# Patient Record
Sex: Female | Born: 1950
Health system: Southern US, Community
[De-identification: ages and names within clinical notes are randomized; demographics above are authoritative.]

## PROBLEM LIST (undated history)

## (undated) DIAGNOSIS — K635 Polyp of colon: Secondary | ICD-10-CM

## (undated) DIAGNOSIS — I2510 Atherosclerotic heart disease of native coronary artery without angina pectoris: Secondary | ICD-10-CM

## (undated) DIAGNOSIS — I10 Essential (primary) hypertension: Secondary | ICD-10-CM

## (undated) DIAGNOSIS — M25562 Pain in left knee: Secondary | ICD-10-CM

## (undated) DIAGNOSIS — C50411 Malignant neoplasm of upper-outer quadrant of right female breast: Secondary | ICD-10-CM

## (undated) DIAGNOSIS — I5032 Chronic diastolic (congestive) heart failure: Secondary | ICD-10-CM

## (undated) DIAGNOSIS — I517 Cardiomegaly: Secondary | ICD-10-CM

## (undated) DIAGNOSIS — T7840XA Allergy, unspecified, initial encounter: Secondary | ICD-10-CM

## (undated) DIAGNOSIS — K219 Gastro-esophageal reflux disease without esophagitis: Secondary | ICD-10-CM

## (undated) DIAGNOSIS — J449 Chronic obstructive pulmonary disease, unspecified: Secondary | ICD-10-CM

## (undated) DIAGNOSIS — M7660 Achilles tendinitis, unspecified leg: Secondary | ICD-10-CM

## (undated) DIAGNOSIS — G4700 Insomnia, unspecified: Secondary | ICD-10-CM

## (undated) DIAGNOSIS — E559 Vitamin D deficiency, unspecified: Secondary | ICD-10-CM

## (undated) DIAGNOSIS — Z87442 Personal history of urinary calculi: Secondary | ICD-10-CM

## (undated) DIAGNOSIS — I440 Atrioventricular block, first degree: Secondary | ICD-10-CM

## (undated) DIAGNOSIS — N809 Endometriosis, unspecified: Secondary | ICD-10-CM

## (undated) DIAGNOSIS — M779 Enthesopathy, unspecified: Secondary | ICD-10-CM

## (undated) DIAGNOSIS — M51369 Other intervertebral disc degeneration, lumbar region without mention of lumbar back pain or lower extremity pain: Secondary | ICD-10-CM

## (undated) DIAGNOSIS — M8580 Other specified disorders of bone density and structure, unspecified site: Secondary | ICD-10-CM

## (undated) DIAGNOSIS — I340 Nonrheumatic mitral (valve) insufficiency: Secondary | ICD-10-CM

## (undated) DIAGNOSIS — M1712 Unilateral primary osteoarthritis, left knee: Secondary | ICD-10-CM

## (undated) DIAGNOSIS — G2581 Restless legs syndrome: Secondary | ICD-10-CM

## (undated) DIAGNOSIS — Z95828 Presence of other vascular implants and grafts: Secondary | ICD-10-CM

## (undated) DIAGNOSIS — S83249A Other tear of medial meniscus, current injury, unspecified knee, initial encounter: Secondary | ICD-10-CM

## (undated) DIAGNOSIS — J189 Pneumonia, unspecified organism: Secondary | ICD-10-CM

## (undated) DIAGNOSIS — T8859XA Other complications of anesthesia, initial encounter: Secondary | ICD-10-CM

## (undated) DIAGNOSIS — I071 Rheumatic tricuspid insufficiency: Secondary | ICD-10-CM

## (undated) DIAGNOSIS — N6019 Diffuse cystic mastopathy of unspecified breast: Secondary | ICD-10-CM

## (undated) DIAGNOSIS — C50919 Malignant neoplasm of unspecified site of unspecified female breast: Secondary | ICD-10-CM

## (undated) DIAGNOSIS — Z87891 Personal history of nicotine dependence: Secondary | ICD-10-CM

## (undated) DIAGNOSIS — N200 Calculus of kidney: Secondary | ICD-10-CM

## (undated) DIAGNOSIS — I700 Atherosclerosis of aorta: Secondary | ICD-10-CM

## (undated) DIAGNOSIS — E039 Hypothyroidism, unspecified: Secondary | ICD-10-CM

## (undated) DIAGNOSIS — N182 Chronic kidney disease, stage 2 (mild): Secondary | ICD-10-CM

## (undated) DIAGNOSIS — D649 Anemia, unspecified: Secondary | ICD-10-CM

## (undated) DIAGNOSIS — I499 Cardiac arrhythmia, unspecified: Secondary | ICD-10-CM

## (undated) DIAGNOSIS — F32A Depression, unspecified: Secondary | ICD-10-CM

## (undated) DIAGNOSIS — Z9289 Personal history of other medical treatment: Secondary | ICD-10-CM

## (undated) DIAGNOSIS — E785 Hyperlipidemia, unspecified: Secondary | ICD-10-CM

## (undated) DIAGNOSIS — K279 Peptic ulcer, site unspecified, unspecified as acute or chronic, without hemorrhage or perforation: Secondary | ICD-10-CM

## (undated) DIAGNOSIS — M545 Low back pain, unspecified: Secondary | ICD-10-CM

## (undated) DIAGNOSIS — D179 Benign lipomatous neoplasm, unspecified: Secondary | ICD-10-CM

## (undated) DIAGNOSIS — I509 Heart failure, unspecified: Secondary | ICD-10-CM

## (undated) DIAGNOSIS — G8929 Other chronic pain: Secondary | ICD-10-CM

## (undated) DIAGNOSIS — G4730 Sleep apnea, unspecified: Secondary | ICD-10-CM

## (undated) DIAGNOSIS — I4891 Unspecified atrial fibrillation: Secondary | ICD-10-CM

## (undated) DIAGNOSIS — F419 Anxiety disorder, unspecified: Secondary | ICD-10-CM

## (undated) DIAGNOSIS — M1990 Unspecified osteoarthritis, unspecified site: Secondary | ICD-10-CM

## (undated) DIAGNOSIS — G43909 Migraine, unspecified, not intractable, without status migrainosus: Secondary | ICD-10-CM

## (undated) DIAGNOSIS — I4892 Unspecified atrial flutter: Secondary | ICD-10-CM

## (undated) DIAGNOSIS — G4733 Obstructive sleep apnea (adult) (pediatric): Secondary | ICD-10-CM

## (undated) DIAGNOSIS — Z7901 Long term (current) use of anticoagulants: Secondary | ICD-10-CM

## (undated) DIAGNOSIS — E079 Disorder of thyroid, unspecified: Secondary | ICD-10-CM

## (undated) DIAGNOSIS — F329 Major depressive disorder, single episode, unspecified: Secondary | ICD-10-CM

## (undated) HISTORY — DX: Insomnia, unspecified: G47.00

## (undated) HISTORY — DX: Hyperlipidemia, unspecified: E78.5

## (undated) HISTORY — PX: ESOPHAGOGASTRODUODENOSCOPY: SHX1529

## (undated) HISTORY — DX: Gastro-esophageal reflux disease without esophagitis: K21.9

## (undated) HISTORY — DX: Allergy, unspecified, initial encounter: T78.40XA

## (undated) HISTORY — PX: ACHILLES TENDON SURGERY: SHX542

## (undated) HISTORY — DX: Benign lipomatous neoplasm, unspecified: D17.9

## (undated) HISTORY — DX: Other chronic pain: G89.29

## (undated) HISTORY — DX: Migraine, unspecified, not intractable, without status migrainosus: G43.909

## (undated) HISTORY — PX: BACK SURGERY: SHX140

## (undated) HISTORY — PX: JOINT REPLACEMENT: SHX530

## (undated) HISTORY — PX: BREAST BIOPSY: SHX20

## (undated) HISTORY — DX: Other specified disorders of bone density and structure, unspecified site: M85.80

## (undated) HISTORY — PX: THYROIDECTOMY: SHX17

## (undated) HISTORY — DX: Other tear of medial meniscus, current injury, unspecified knee, initial encounter: S83.249A

## (undated) HISTORY — DX: Anxiety disorder, unspecified: F41.9

## (undated) HISTORY — DX: Vitamin D deficiency, unspecified: E55.9

## (undated) HISTORY — DX: Low back pain, unspecified: M54.50

## (undated) HISTORY — DX: Calculus of kidney: N20.0

## (undated) HISTORY — PX: COLONOSCOPY: SHX174

## (undated) HISTORY — PX: LUMBAR FUSION: SHX111

## (undated) HISTORY — DX: Unspecified osteoarthritis, unspecified site: M19.90

## (undated) HISTORY — DX: Major depressive disorder, single episode, unspecified: F32.9

## (undated) HISTORY — PX: ABDOMINAL HYSTERECTOMY: SHX81

## (undated) HISTORY — DX: Diffuse cystic mastopathy of unspecified breast: N60.19

## (undated) HISTORY — DX: Personal history of other medical treatment: Z92.89

## (undated) HISTORY — PX: FOOT SURGERY: SHX648

## (undated) HISTORY — DX: Low back pain: M54.5

## (undated) HISTORY — DX: Peptic ulcer, site unspecified, unspecified as acute or chronic, without hemorrhage or perforation: K27.9

## (undated) HISTORY — DX: Achilles tendinitis, unspecified leg: M76.60

## (undated) HISTORY — DX: Chronic obstructive pulmonary disease, unspecified: J44.9

## (undated) HISTORY — DX: Essential (primary) hypertension: I10

## (undated) HISTORY — DX: Disorder of thyroid, unspecified: E07.9

## (undated) HISTORY — DX: Depression, unspecified: F32.A

## (undated) HISTORY — DX: Nonrheumatic mitral (valve) insufficiency: I34.0

## (undated) HISTORY — DX: Enthesopathy, unspecified: M77.9

## (undated) SURGERY — CARDIOVERSION (CATH LAB)
Anesthesia: General

---

## 1988-07-11 HISTORY — PX: THYROIDECTOMY: SHX17

## 2012-10-10 LAB — HM COLONOSCOPY

## 2013-04-11 ENCOUNTER — Ambulatory Visit (INDEPENDENT_AMBULATORY_CARE_PROVIDER_SITE_OTHER): Payer: BC Managed Care – PPO | Admitting: General Surgery

## 2013-04-11 ENCOUNTER — Encounter (INDEPENDENT_AMBULATORY_CARE_PROVIDER_SITE_OTHER): Payer: Self-pay | Admitting: General Surgery

## 2013-04-11 VITALS — BP 142/70 | HR 60 | Resp 16 | Ht 65.0 in | Wt 286.2 lb

## 2013-04-11 NOTE — Patient Instructions (Signed)
We will start our evaluation and workup Please get your mammogram and have the results forwarded to my office Please call with any questions

## 2013-04-12 ENCOUNTER — Encounter (INDEPENDENT_AMBULATORY_CARE_PROVIDER_SITE_OTHER): Payer: Self-pay | Admitting: General Surgery

## 2013-04-12 ENCOUNTER — Telehealth (INDEPENDENT_AMBULATORY_CARE_PROVIDER_SITE_OTHER): Payer: Self-pay | Admitting: General Surgery

## 2013-04-12 NOTE — Telephone Encounter (Signed)
LMOM @8 :56 for patient to call Lawson Fiscal back re: labs and med release to get Abd U/S and colonoscopy from [facility] per EW..please overhead Lawson Fiscal if patient calls back

## 2013-04-12 NOTE — Progress Notes (Signed)
Patient ID: Amber Barnes, female   DOB: 08/19/1950, 62 y.o.   MRN: [id number]  Chief Complaint  Patient presents with  . Bariatric Pre-op    Initial consult gastric sleeve  . Weight Loss Surgery    HPI Amber Barnes is a 62 y.o. female.   HPI 62 year old Caucasian female referred by Dr. Konrad Felix for evaluation of weight loss surgery. The patient states that she has struggled with her weight since she was in her 25s. Despite numerous attempts were sustained weight loss she has been unsuccessful. She states that she is very healthy because her husband is a diabetic. Nonetheless she continues to gain weight. She has tried the [employer], Weight Watchers, Slim fast, a private nutritionist all without any long-term success. She has acquaintances that have had all 3 types of bariatric procedures. After discussing the different procedures with her friends as well as attending a seminar she believes the sleeve gastrectomy would best suit her.  Past Medical History  Diagnosis Date  . Hypertension   . Hyperlipidemia   . Arthritis     Past Surgical History  Procedure Laterality Date  . Abdominal hysterectomy    . Thyroidectomy      thyroid lobectomy secondary to a benign nodule  . Achilles tendon surgery      Family History  Problem Relation Age of Onset  . Cancer Mother     leukemia  . Cancer Father     colon  . Heart disease Father   . Hypertension Father   . Cancer Sister     ovarian    Social History History  Substance Use Topics  . Smoking status: Former Games developer  . Smokeless tobacco: Never Used  . Alcohol Use: Yes    Not on File  Current Outpatient Prescriptions  Medication Sig Dispense Refill  . aspirin 81 MG tablet Take 81 mg by mouth daily.      Marland Kitchen atorvastatin (LIPITOR) 40 MG tablet Take 40 mg by mouth daily.      . bisoprolol (ZEBETA) 10 MG tablet Take 10 mg by mouth daily.      Marland Kitchen CALCIUM PO Take by mouth.      . Cholecalciferol (VITAMIN D PO) Take by mouth.       . diclofenac (VOLTAREN) 75 MG EC tablet Take 75 mg by mouth 2 (two) times daily.      Marland Kitchen losartan-hydrochlorothiazide (HYZAAR) 100-25 MG per tablet Take 1 tablet by mouth daily.      . Multiple Vitamin (MULTI VITAMIN DAILY PO) Take by mouth.      . sertraline (ZOLOFT) 100 MG tablet Take 100 mg by mouth daily.       No current facility-administered medications for this visit.    Review of Systems Review of Systems  Constitutional: Negative for fever, chills and unexpected weight change.  HENT: Negative for hearing loss, nosebleeds, congestion, sore throat, trouble swallowing, neck pain and voice change.        Partial denture  Eyes: Negative for visual disturbance.  Respiratory: Negative for cough and wheezing.   Cardiovascular: Negative for chest pain, palpitations and leg swelling.       She denies any short of breath, dyspnea on exertion, orthopnea, paroxysmal nocturnal dyspnea, or chest pain.  Gastrointestinal: Negative for nausea, vomiting, abdominal pain, diarrhea, constipation, blood in stool and abdominal distention.       She denies any reflux  Genitourinary: Negative for hematuria, vaginal bleeding and difficulty urinating.  Musculoskeletal: Negative for arthralgias.  She has bilateral knee pain left greater than right. She has a left meniscus tear. Orthopedic surgeon states that she needs to loose weight prior to intervention. Osteoarthritis left knee. She had right Achilles tendon surgery 2 years ago. Because of left knee pain she hasn't been able to do as much exercising as she likes to do  Skin: Negative for rash and wound.  Neurological: Negative for seizures, syncope and headaches.       She denies any TIAs or amaurosis fugax  Hematological: Negative for adenopathy. Does not bruise/bleed easily.  Psychiatric/Behavioral: Negative for confusion.    Blood pressure 142/70, pulse 60, resp. rate 16, height 5\' 5"  (1.651 m), weight 286 lb 3.2 oz (129.819 kg).  Physical  Exam Physical Exam  Vitals reviewed. Constitutional: She is oriented to person, place, and time. She appears well-developed and well-nourished. No distress.  Morbidly obese  HENT:  Head: Normocephalic and atraumatic.  Right Ear: External ear normal.  Left Ear: External ear normal.  Eyes: Conjunctivae are normal. No scleral icterus.  Neck: Normal range of motion. Neck supple. No tracheal deviation present. No thyromegaly present.    Cardiovascular: Normal rate and normal heart sounds.   Pulmonary/Chest: Effort normal and breath sounds normal. No stridor. No respiratory distress. She has no wheezes.  Abdominal: Soft. She exhibits no distension. There is no tenderness. There is no rebound and no guarding.    Musculoskeletal: She exhibits no edema and no tenderness.  Lymphadenopathy:    She has no cervical adenopathy.  Neurological: She is alert and oriented to person, place, and time. She exhibits normal muscle tone.  Skin: Skin is warm and dry. No rash noted. She is not diaphoretic. No erythema.  Psychiatric: She has a normal mood and affect. Her behavior is normal. Judgment and thought content normal.    Data Reviewed Epworth sleepiness scale score 1  Assessment    Morbid obesity 47.6 Hypertension Hyperlipidemia     Plan    The patient meets weight loss surgery criteria. I think the patient would be an acceptable candidate for Laparoscopic vertical sleeve gastrectomy.   We discussed laparoscopic sleeve gastrectomy. We discussed the preoperative, operative and postoperative process. Using diagrams, I explained the surgery in detail including the performance of an EGD near the end of the surgery and an Upper GI swallow study on POD 1. We discussed the typical hospital course including a 2-3 day stay baring any complications.   The patient was given educational material. I quoted the patient that most patients can lose up to 50-70% of their excess weight. We did discuss the  possibility of weight regain several years after the procedure.  The risks of infection, bleeding, pain, scarring, weight regain, too little or too much weight loss, vitamin deficiencies and need for lifelong vitamin supplementation, hair loss, need for protein supplementation, leaks, stricture, reflux, food intolerance, gallstone formation, hernia, need for reoperation and conversion to roux Y gastric bypass, need for open surgery, injury to spleen or surrounding structures, DVT's, PE, and death again discussed with the patient and the patient expressed understanding and desires to proceed with laparoscopic vertical sleeve gastrectomy, possible open, intraoperative endoscopy.  We discussed that before and after surgery that there would be an alteration in their diet. I explained that we have put them on a diet 2 weeks before surgery. I also explained that they would be on a liquid diet for 2 weeks after surgery. We discussed that they would have to avoid certain foods after  surgery. We discussed the importance of physical activity as well as compliance with our dietary and supplement recommendations and routine follow-up.  I explained to the patient that we will start our evaluation process which includes labs, Upper GI to evaluate stomach and swallowing anatomy, nutritionist consultation, psychiatrist consultation, EKG, CXR. We will get her records of her abdominal ultrasound and colonoscopy from [facility]  Mary Sella. Andrey Campanile, MD, FACS General, Bariatric, & Minimally Invasive Surgery [facility], [place]          [facility] M 04/12/2013, 10:44 AM

## 2013-04-15 ENCOUNTER — Telehealth (INDEPENDENT_AMBULATORY_CARE_PROVIDER_SITE_OTHER): Payer: Self-pay | Admitting: General Surgery

## 2013-04-15 NOTE — Telephone Encounter (Signed)
Faxed signed med release asking for abd U/S and colonoscopy report [phone] @11 :16 ..waiting on the reports to be faxed back per EW req...signed medical release and confirmation to med recds to be scanned in chart

## 2013-04-24 ENCOUNTER — Encounter: Payer: Self-pay | Admitting: Dietician

## 2013-04-24 ENCOUNTER — Encounter: Payer: BC Managed Care – PPO | Attending: General Surgery | Admitting: Dietician

## 2013-04-24 VITALS — Ht 66.0 in | Wt 288.6 lb

## 2013-04-24 DIAGNOSIS — Z713 Dietary counseling and surveillance: Secondary | ICD-10-CM | POA: Insufficient documentation

## 2013-04-24 DIAGNOSIS — E669 Obesity, unspecified: Secondary | ICD-10-CM | POA: Insufficient documentation

## 2013-04-24 NOTE — Progress Notes (Signed)
  Pre-Op Assessment Visit:  Pre-Operative Sleeve Gastrectomy Surgery  Medical Nutrition Therapy:  Appt start time: 1130   End time:  1230.  Patient was seen on 04/24/13 for Pre-Operative Sleeve Gastrectomy Nutrition Assessment. Assessment and letter of approval faxed to [facility] Bariatric Surgery Program coordinator on 04/24/13.   Handouts given during visit include:  Pre-Op Goals Bariatric Surgery Protein Shakes  Patient to call the [facility] to enroll in Pre-Op and Post-Op Nutrition Education when surgery date is scheduled.

## 2013-04-24 NOTE — Patient Instructions (Signed)
Patient to call the [facility] to enroll in Pre-Op and Post-Op Nutrition Education when surgery date is scheduled. 

## 2013-05-06 ENCOUNTER — Encounter (INDEPENDENT_AMBULATORY_CARE_PROVIDER_SITE_OTHER): Payer: Self-pay

## 2013-05-07 ENCOUNTER — Other Ambulatory Visit: Payer: Self-pay

## 2013-05-07 ENCOUNTER — Ambulatory Visit (HOSPITAL_COMMUNITY)
Admission: RE | Admit: 2013-05-07 | Discharge: 2013-05-07 | Disposition: A | Discharge status: Home / Self Care | Payer: BC Managed Care – PPO | Source: Ambulatory Visit | Attending: General Surgery | Admitting: General Surgery

## 2013-05-07 DIAGNOSIS — M129 Arthropathy, unspecified: Secondary | ICD-10-CM | POA: Insufficient documentation

## 2013-05-07 DIAGNOSIS — K219 Gastro-esophageal reflux disease without esophagitis: Secondary | ICD-10-CM | POA: Insufficient documentation

## 2013-05-07 DIAGNOSIS — E785 Hyperlipidemia, unspecified: Secondary | ICD-10-CM | POA: Insufficient documentation

## 2013-05-07 DIAGNOSIS — K449 Diaphragmatic hernia without obstruction or gangrene: Secondary | ICD-10-CM | POA: Insufficient documentation

## 2013-05-07 DIAGNOSIS — I10 Essential (primary) hypertension: Secondary | ICD-10-CM | POA: Insufficient documentation

## 2013-05-07 DIAGNOSIS — I517 Cardiomegaly: Secondary | ICD-10-CM | POA: Insufficient documentation

## 2013-05-07 DIAGNOSIS — Z6841 Body Mass Index (BMI) 40.0 and over, adult: Secondary | ICD-10-CM | POA: Insufficient documentation

## 2013-05-14 LAB — LIPID PANEL
Cholesterol: 175 mg/dL (ref 0–200)
HDL: 52 mg/dL (ref >39)
LDL Cholesterol: 101 mg/dL — ABNORMAL HIGH (ref 0–99)
Total CHOL/HDL Ratio: 3.4 Ratio
Triglycerides: 108 mg/dL (ref <150)
VLDL: 22 mg/dL (ref 0–40)

## 2013-05-14 LAB — CBC WITH DIFFERENTIAL/PLATELET
Basophils Absolute: 0 10*3/uL (ref 0.0–0.1)
Basophils Relative: 0 % (ref 0–1)
Eosinophils Absolute: 0.1 10*3/uL (ref 0.0–0.7)
Eosinophils Relative: 1 % (ref 0–5)
HCT: 37.2 % (ref 36.0–46.0)
Hemoglobin: 12.9 g/dL (ref 12.0–15.0)
Lymphocytes Relative: 27 % (ref 12–46)
Lymphs Abs: 1.8 10*3/uL (ref 0.7–4.0)
MCH: 30.2 pg (ref 26.0–34.0)
MCHC: 34.7 g/dL (ref 30.0–36.0)
MCV: 87.1 fL (ref 78.0–100.0)
Monocytes Absolute: 0.5 10*3/uL (ref 0.1–1.0)
Monocytes Relative: 7 % (ref 3–12)
Neutro Abs: 4.3 10*3/uL (ref 1.7–7.7)
Neutrophils Relative %: 65 % (ref 43–77)
Platelets: 198 10*3/uL (ref 150–400)
RBC: 4.27 MIL/uL (ref 3.87–5.11)
RDW: 14.2 % (ref 11.5–15.5)
WBC: 6.6 10*3/uL (ref 4.0–10.5)

## 2013-05-14 LAB — COMPREHENSIVE METABOLIC PANEL
ALT: 22 U/L (ref 0–35)
AST: 18 U/L (ref 0–37)
Albumin: 3.9 g/dL (ref 3.5–5.2)
Alkaline Phosphatase: 73 U/L (ref 39–117)
BUN: 13 mg/dL (ref 6–23)
CO2: 27 mEq/L (ref 19–32)
Calcium: 9 mg/dL (ref 8.4–10.5)
Chloride: 105 mEq/L (ref 96–112)
Creat: 0.65 mg/dL (ref 0.50–1.10)
Glucose, Bld: 86 mg/dL (ref 70–99)
Potassium: 4.3 mEq/L (ref 3.5–5.3)
Sodium: 138 mEq/L (ref 135–145)
Total Bilirubin: 0.4 mg/dL (ref 0.3–1.2)
Total Protein: 6.4 g/dL (ref 6.0–8.3)

## 2013-05-14 LAB — T4: T4, Total: 6.3 ug/dL (ref 5.0–12.5)

## 2013-05-14 LAB — TSH: TSH: 2.519 u[IU]/mL (ref 0.350–4.500)

## 2013-05-15 LAB — H. PYLORI ANTIBODY, IGG: H Pylori IgG: 0.43 {ISR}

## 2014-09-29 ENCOUNTER — Ambulatory Visit: Payer: Self-pay

## 2014-10-10 ENCOUNTER — Emergency Department
Admit: 2014-10-10 | Disposition: A | Discharge status: Home / Self Care | Payer: Self-pay | Admitting: Emergency Medicine

## 2014-10-10 LAB — URINALYSIS, COMPLETE
Bacteria: NONE SEEN
Bilirubin,UR: NEGATIVE
Glucose,UR: NEGATIVE mg/dL (ref 0–75)
Ketone: NEGATIVE
Leukocyte Esterase: NEGATIVE
Nitrite: NEGATIVE
Ph: 6 (ref 4.5–8.0)
Protein: NEGATIVE
RBC,UR: NONE SEEN /HPF (ref 0–5)
Specific Gravity: 1.006 (ref 1.003–1.030)
Squamous Epithelial: NONE SEEN
WBC UR: 1 /HPF (ref 0–5)

## 2014-10-21 ENCOUNTER — Ambulatory Visit: Payer: Self-pay | Admitting: Cardiovascular Disease

## 2014-11-11 ENCOUNTER — Encounter (INDEPENDENT_AMBULATORY_CARE_PROVIDER_SITE_OTHER): Payer: Self-pay

## 2014-11-11 ENCOUNTER — Other Ambulatory Visit: Payer: Self-pay | Admitting: Orthopedic Surgery

## 2014-11-11 ENCOUNTER — Ambulatory Visit (INDEPENDENT_AMBULATORY_CARE_PROVIDER_SITE_OTHER): Payer: PRIVATE HEALTH INSURANCE | Admitting: Cardiovascular Disease

## 2014-11-11 ENCOUNTER — Encounter: Payer: Self-pay | Admitting: Cardiovascular Disease

## 2014-11-11 VITALS — BP 112/80 | HR 56 | Ht 65.0 in | Wt 292.8 lb

## 2014-11-11 DIAGNOSIS — E785 Hyperlipidemia, unspecified: Secondary | ICD-10-CM

## 2014-11-11 DIAGNOSIS — E669 Obesity, unspecified: Secondary | ICD-10-CM | POA: Insufficient documentation

## 2014-11-11 DIAGNOSIS — T50901A Poisoning by unspecified drugs, medicaments and biological substances, accidental (unintentional), initial encounter: Secondary | ICD-10-CM

## 2014-11-11 DIAGNOSIS — R634 Abnormal weight loss: Secondary | ICD-10-CM | POA: Insufficient documentation

## 2014-11-11 DIAGNOSIS — F419 Anxiety disorder, unspecified: Secondary | ICD-10-CM | POA: Insufficient documentation

## 2014-11-11 DIAGNOSIS — F418 Other specified anxiety disorders: Secondary | ICD-10-CM

## 2014-11-11 DIAGNOSIS — E782 Mixed hyperlipidemia: Secondary | ICD-10-CM | POA: Insufficient documentation

## 2014-11-11 DIAGNOSIS — M1990 Unspecified osteoarthritis, unspecified site: Secondary | ICD-10-CM | POA: Insufficient documentation

## 2014-11-11 DIAGNOSIS — I10 Essential (primary) hypertension: Secondary | ICD-10-CM | POA: Insufficient documentation

## 2014-11-11 DIAGNOSIS — T50905A Adverse effect of unspecified drugs, medicaments and biological substances, initial encounter: Secondary | ICD-10-CM

## 2014-11-11 DIAGNOSIS — F329 Major depressive disorder, single episode, unspecified: Secondary | ICD-10-CM

## 2014-11-11 DIAGNOSIS — F32A Depression, unspecified: Secondary | ICD-10-CM | POA: Insufficient documentation

## 2014-11-11 DIAGNOSIS — R0602 Shortness of breath: Secondary | ICD-10-CM

## 2014-11-11 DIAGNOSIS — M170 Bilateral primary osteoarthritis of knee: Secondary | ICD-10-CM

## 2014-11-11 DIAGNOSIS — M5126 Other intervertebral disc displacement, lumbar region: Secondary | ICD-10-CM

## 2014-11-11 NOTE — Patient Instructions (Signed)
You are doing well. No medication changes were made.  Please call us if you have new issues that need to be addressed before your next appt.  Your physician wants you to follow-up in: 12 months.  You will receive a reminder letter in the mail two months in advance. If you don't receive a letter, please call our office to schedule the follow-up appointment. 

## 2014-11-11 NOTE — Assessment & Plan Note (Signed)
She reports 13 pound weight loss on the new medication in the past month. Also has changed her eating habits, less snacking

## 2014-11-11 NOTE — Assessment & Plan Note (Signed)
Recommended she stay on her Crestor. She will have routine lab work with primary care end of May 2016

## 2014-11-11 NOTE — Assessment & Plan Note (Signed)
She reports stable symptoms on her current medications

## 2014-11-11 NOTE — Progress Notes (Signed)
Patient ID: Amber Barnes, female   DOB: Jul 28, 1950, 64 y.o.   MRN: [id number] [name] is a pleasant 65 year old woman with history of obesity, ostial arthritis, hyperlipidemia, hypertension, who recently moved to the [place] area from [place] and presents to establish care in the [place] office.  She reports that she has been on Belviq for the past month for weight loss and needs clearance to continue on the medication. She is limited in her activity secondary to chronic back and knee pain.  She is seeing orthopedics for her back and was told not to start exercise regiment until they have a plan for rehabilitation. She reports having a 13 pound weight loss since March 2016 with the new medication. She denies having any new symptoms. Denies any shortness of breath, chest discomfort. Reports blood pressure has been well-controlled on her current medications.  She denies any prior cardiac history. Prior workup 8-10 years ago at [facility] with [facility], told it was normal  She is having sciatica pain for the past 2 months with discomfort radiating down her right leg.  In terms of her past medical history, She did not tolerate Lipitor and cholesterol climbed up to 285, LDL 196 On Lipitor total cholesterol 168, LDL 88 in October 2015 No recent lipid panel available on Crestor She is scheduled to have repeat lab work at the end of May 2016 with primary care  She reports having a prior smoking history for 5 years, then smoked only small amounts  EKG on today's visit shows normal sinus rhythm with rate 56 bpm, nonspecific T wave abnormality through the anterior precordial leads, leads 3 and aVF  No Known Allergies  Current Outpatient Prescriptions on File Prior to Visit  Medication Sig Dispense Refill  . aspirin 81 MG tablet Take 81 mg by mouth daily.    . B Complex Vitamins (VITAMIN B COMPLEX PO) Take by mouth daily.    . bisoprolol (ZEBETA) 10 MG tablet Take 10 mg by mouth  daily.    . calcium gluconate 500 MG tablet Take 1 tablet by mouth daily.     . Lorcaserin HCl 10 MG TABS Take 10 mg by mouth 2 (two) times daily.    Marland Kitchen losartan-hydrochlorothiazide (HYZAAR) 100-25 MG per tablet Take 1 tablet by mouth daily.    . Multiple Vitamin (MULTIVITAMIN) tablet Take 1 tablet by mouth daily.    . rosuvastatin (CRESTOR) 20 MG tablet Take 20 mg by mouth daily.    . sertraline (ZOLOFT) 100 MG tablet Take 100 mg by mouth daily.     No current facility-administered medications on file prior to visit.    Past Medical History  Diagnosis Date  . Achilles tendinitis   . Acute medial meniscal tear   . Osteopenia   . Osteoarthritis     left knee  . Vitamin D deficiency   . Tendonitis   . Hypertension   . Hyperlipidemia   . Anxiety   . Chronic lower back pain   . Fibrocystic breast disease   . Insomnia   . Lipoma     Past Surgical History  Procedure Laterality Date  . Colonoscopy    . Esophagogastroduodenoscopy    . Abdominal hysterectomy    . Total thyroidectomy      Social History  reports that she has quit smoking. Her smoking use included Cigarettes. She has a 2.5 pack-year smoking history. She does not have any smokeless tobacco history on file. She reports that she drinks alcohol.  She reports that she does not use illicit drugs.  Family History family history includes Heart attack (age of onset: 93) in her father; Heart disease in her father; Hyperlipidemia in her father; Hypertension in her father.   Review of Systems: General: negative for chills, fever, night sweats or weight changes.  Cardiovascular: negative for chest pain, edema, orthopnea, palpitations, paroxysmal nocturnal dyspnea, shortness of breath or dyspnea on exertion Dermatological: negative for rash Respiratory: negative for cough or wheezing Orthopedic: Severe back, knee, hip pain Abdominal: negative for nausea, vomiting, diarrhea, bright red blood per rectum, melena, or  hematemesis Neurologic: negative for visual changes, syncope, or dizziness All other systems reviewed and are otherwise negative except as noted above.  Labs: No recent labs available  Radiology/Studies:  No results found.  Weights: Filed Weights   11/11/14 1515  Weight: 132.791 kg (292 lb 12 oz)     Physical Exam: Blood pressure 112/80, pulse 56, height 5\' 5"  (1.651 m), weight 132.791 kg (292 lb 12 oz). General: Well developed, well nourished, in no acute distress. Obese Head: Normocephalic, atraumatic, sclera non-icteric, no xanthomas, nares are without discharge.  Neck: Negative for carotid bruits. JVD not elevated. Lungs: Clear bilaterally to auscultation without wheezes, rales, or rhonchi. Breathing is unlabored. Heart: RRR with S1 S2. No murmurs, rubs, or gallops appreciated. Abdomen: Soft, non-tender, non-distended with normoactive bowel sounds. No hepatomegaly. No rebound/guarding. No obvious abdominal masses. Msk:  Strength and tone appear normal for age. Extremities: No clubbing or cyanosis. No edema.  Distal pedal pulses are 2+ and equal bilaterally. Neuro: Alert and oriented X 3. No focal deficit. No facial asymmetry. Moves all extremities spontaneously. Psych:  Responds to questions appropriately with a normal affect.

## 2014-11-11 NOTE — Assessment & Plan Note (Signed)
Blood pressure is well controlled on today's visit. No changes made to the medications. 

## 2014-11-11 NOTE — Assessment & Plan Note (Signed)
Limited in her ability to exercise secondary to her back, severe knee pain.  Would consider water aerobics once cleared by orthopedics

## 2014-11-11 NOTE — Assessment & Plan Note (Signed)
Currently appears to be handling Belviq well with no complications. She denies any side effects. Blood pressure, and clinical exam essentially normal. Nonspecific T wave abnormality on EKG. Given her lack of symptoms, we can continue medical management, no further testing needed. She would be acceptable candidate to continue this medication given the benefits of weight loss.

## 2014-11-15 ENCOUNTER — Ambulatory Visit
Admission: RE | Admit: 2014-11-15 | Discharge: 2014-11-15 | Disposition: A | Discharge status: Home / Self Care | Payer: PRIVATE HEALTH INSURANCE | Source: Ambulatory Visit | Attending: Orthopedic Surgery | Admitting: Orthopedic Surgery

## 2014-11-15 DIAGNOSIS — M4806 Spinal stenosis, lumbar region: Secondary | ICD-10-CM | POA: Diagnosis not present

## 2014-11-15 DIAGNOSIS — M5126 Other intervertebral disc displacement, lumbar region: Secondary | ICD-10-CM | POA: Diagnosis present

## 2014-11-30 ENCOUNTER — Encounter: Payer: Self-pay | Admitting: Emergency Medicine

## 2014-11-30 ENCOUNTER — Emergency Department
Admission: EM | Admit: 2014-11-30 | Discharge: 2014-11-30 | Disposition: A | Discharge status: Home / Self Care | Payer: PRIVATE HEALTH INSURANCE | Attending: Emergency Medicine | Admitting: Emergency Medicine

## 2014-11-30 DIAGNOSIS — I10 Essential (primary) hypertension: Secondary | ICD-10-CM | POA: Diagnosis not present

## 2014-11-30 DIAGNOSIS — M5410 Radiculopathy, site unspecified: Secondary | ICD-10-CM

## 2014-11-30 DIAGNOSIS — M5416 Radiculopathy, lumbar region: Secondary | ICD-10-CM | POA: Diagnosis not present

## 2014-11-30 DIAGNOSIS — M545 Low back pain, unspecified: Secondary | ICD-10-CM

## 2014-11-30 DIAGNOSIS — Z79899 Other long term (current) drug therapy: Secondary | ICD-10-CM | POA: Diagnosis not present

## 2014-11-30 DIAGNOSIS — G8929 Other chronic pain: Secondary | ICD-10-CM

## 2014-11-30 DIAGNOSIS — Z87891 Personal history of nicotine dependence: Secondary | ICD-10-CM | POA: Insufficient documentation

## 2014-11-30 MED ORDER — HYDROMORPHONE HCL 1 MG/ML IJ SOLN
INTRAMUSCULAR | Status: AC
  Filled 2014-11-30: qty 1

## 2014-11-30 MED ORDER — PREDNISONE 10 MG PO TABS: ORAL_TABLET | ORAL | Status: DC

## 2014-11-30 MED ORDER — HYDROMORPHONE HCL 1 MG/ML IJ SOLN
1.0000 mg | Freq: Once | INTRAMUSCULAR | Status: AC
  Administered 2014-11-30: 1 mg via INTRAMUSCULAR

## 2014-11-30 NOTE — ED Provider Notes (Signed)
[facility] Emergency Department Provider Note  ____________________________________________  Time seen: Approximately 1552  I have reviewed the triage vital signs and the nursing notes.   HISTORY  Chief Complaint No chief complaint on file.  back pain  HPI Amber Barnes is a 64 y.o. female comes in today with complaints of low back pain radiating to the right and into the groin area. She states she's been experiencing this for at least 2-3 months but today is worse. Currently her orthopedist in [place] has her taking hydrocodone which she states is not helping today. No recent injury. No loss of bowel or bladder function. Patient states her pain currently is 10 out of 10. Her husband is in the room with her. She has an appointment with her orthopedist in [facility]  5/26. She had an MRI done in [place]   Past Medical History  Diagnosis Date  . Achilles tendinitis   . Acute medial meniscal tear   . Osteopenia   . Osteoarthritis     left knee  . Vitamin D deficiency   . Tendonitis   . Hypertension   . Hyperlipidemia   . Anxiety   . Chronic lower back pain   . Fibrocystic breast disease   . Insomnia   . Lipoma     Patient Active Problem List   Diagnosis Date Noted  . SOB (shortness of breath) 11/11/2014  . Morbid obesity 11/11/2014  . Hyperlipidemia 11/11/2014  . Essential hypertension 11/11/2014  . Anxiety and depression 11/11/2014  . Arthritis, senescent 11/11/2014  . Weight loss due to medication 11/11/2014    Past Surgical History  Procedure Laterality Date  . Colonoscopy    . Esophagogastroduodenoscopy    . Abdominal hysterectomy    . Total thyroidectomy    . Achilles tendon surgery      Current Outpatient Rx  Name  Route  Sig  Dispense  Refill  . aspirin 81 MG tablet   Oral   Take 81 mg by mouth daily.         . B Complex Vitamins (VITAMIN B COMPLEX PO)   Oral   Take by mouth daily.         . bisoprolol (ZEBETA) 10 MG  tablet   Oral   Take 10 mg by mouth daily.         . calcium gluconate 500 MG tablet   Oral   Take 1 tablet by mouth daily.          Marland Kitchen HYDROcodone-acetaminophen (NORCO) 10-325 MG per tablet   Oral   Take 1 tablet by mouth every 6 (six) hours as needed.         . Lorcaserin HCl 10 MG TABS   Oral   Take 10 mg by mouth 2 (two) times daily.         Marland Kitchen losartan-hydrochlorothiazide (HYZAAR) 100-25 MG per tablet   Oral   Take 1 tablet by mouth daily.         . Multiple Vitamin (MULTIVITAMIN) tablet   Oral   Take 1 tablet by mouth daily.         . predniSONE (DELTASONE) 10 MG tablet      Take 6 tablets  today, on day 2 take 5 tablets, day 3 take 4 tablets, day 4 take 3 tablets, day 5 take  2 tablets and 1 tablet the last day   21 tablet   0   . rosuvastatin (CRESTOR) 20 MG tablet  Oral   Take 20 mg by mouth daily.         . sertraline (ZOLOFT) 100 MG tablet   Oral   Take 100 mg by mouth daily.         Marland Kitchen VITAMIN D, CHOLECALCIFEROL, PO   Oral   Take 500 Units by mouth daily.           Allergies Review of patient's allergies indicates no known allergies.  Family History  Problem Relation Age of Onset  . Heart disease Father   . Heart attack Father 31  . Hyperlipidemia Father   . Hypertension Father     Social History History  Substance Use Topics  . Smoking status: Former Smoker -- 0.25 packs/day for 10 years    Types: Cigarettes  . Smokeless tobacco: Not on file  . Alcohol Use: No     Comment: social    Review of Systems Constitutional: No fever/chills Eyes: No visual changes. ENT: No sore throat. Cardiovascular: Denies chest pain. Respiratory: Denies shortness of breath. Gastrointestinal: No abdominal pain.  No nausea, no vomiting.  No diarrhea.   Genitourinary: Negative for dysuria and negative for kidney stones. Musculoskeletal: Positive for chronic back pain Skin: Negative for rash. Neurological: Negative for headaches there is  minimal numbness in the right leg.  10-point ROS otherwise negative.  ____________________________________________   PHYSICAL EXAM:  VITAL SIGNS: ED Triage Vitals  Enc Vitals Group     BP 11/30/14 1459 158/82 mmHg     Pulse Rate 11/30/14 1459 56     Resp 11/30/14 1459 20     Temp 11/30/14 1459 97.9 F (36.6 C)     Temp Source 11/30/14 1459 Oral     SpO2 11/30/14 1459 94 %     Weight 11/30/14 1459 292 lb (132.45 kg)     Height 11/30/14 1459 5\' 5"  (1.651 m)     Head Cir --      Peak Flow --      Pain Score 11/30/14 1502 10     Pain Loc --      Pain Edu? --      Excl. in [place]? --     Constitutional: Alert and oriented. Well appearing and in no acute distress. Patient is morbidly obese sitting in wheelchair. Eyes: Conjunctivae are normal. PERRL. EOMI. Head: Atraumatic. Nose: No congestion/rhinnorhea. Mouth/Throat: Mucous membranes are moist.  Oropharynx non-erythematous. Neck: No stridor.   Cardiovascular: Normal rate, regular rhythm. Grossly normal heart sounds.  Good peripheral circulation. Respiratory: Normal respiratory effort.  No retractions. Lungs CTAB. Gastrointestinal: Soft and nontender. No distention. No abdominal bruits. No CVA tenderness. Musculoskeletal: Moderate tenderness on palpation of lower back L5-S1 area and paravertebral muscles. Range of motion is restricted secondary to pain. Straight leg raises left leg 90 without discomfort right leg approximately 45 with discomfort. Patient is sitting in a wheelchair and states that it's extremely painful for her to stand or move. She states this pain is no different from what she is been experiencing for the last 2-3 months. Neurologic:  Normal speech and language. No gross focal neurologic deficits are appreciated. Speech is normal. No gait instability. Skin:  Skin is warm, dry and intact. No rash noted. Psychiatric: Mood and affect are normal. Speech and behavior are  normal.  ____________________________________________   LABS (all labs ordered are listed, but only abnormal results are displayed)  Labs Reviewed - No data to display ____________________________________________  EKG  Deferred ____________________________________________  RADIOLOGY  Deferred ____________________________________________  PROCEDURES  Procedure(s) performed: None  Critical Care performed: No  ____________________________________________   INITIAL IMPRESSION / ASSESSMENT AND PLAN / ED COURSE  Pertinent labs & imaging results that were available during my care of the patient were reviewed by me and considered in my medical decision making (see chart for details).  MRI results was discussed with patient and husband. She is to follow-up with her orthopedist and around on her scheduled appointment. Discussed with her 1 time dose of pain medication the emergency room and to start on prednisone taper. She is return to the emergency room if any severe worsening or loss of bowel or bladder control ____________________________________________   FINAL CLINICAL IMPRESSION(S) / ED DIAGNOSES  Final diagnoses:  Chronic low back pain  Radiculopathy with lower extremity symptoms      Johnn Hai, PA-C 11/30/14 1800  Carrie Mew, MD 12/02/14 1207

## 2014-11-30 NOTE — ED Notes (Signed)
Pt reports chronic back pain lower back pain that radiates to groin. Pt reports severe pain today.

## 2014-12-04 ENCOUNTER — Other Ambulatory Visit: Payer: Self-pay | Admitting: Orthopedic Surgery

## 2014-12-04 DIAGNOSIS — M25551 Pain in right hip: Secondary | ICD-10-CM

## 2014-12-18 ENCOUNTER — Ambulatory Visit
Admission: RE | Admit: 2014-12-18 | Discharge: 2014-12-18 | Disposition: A | Discharge status: Home / Self Care | Payer: PRIVATE HEALTH INSURANCE | Source: Ambulatory Visit | Attending: Orthopedic Surgery | Admitting: Orthopedic Surgery

## 2014-12-18 DIAGNOSIS — M1611 Unilateral primary osteoarthritis, right hip: Secondary | ICD-10-CM | POA: Insufficient documentation

## 2014-12-18 DIAGNOSIS — M25551 Pain in right hip: Secondary | ICD-10-CM

## 2014-12-18 MED ORDER — IOHEXOL 300 MG/ML  SOLN: 1.0000 mL | Freq: Once | INTRAMUSCULAR | Status: AC | PRN

## 2014-12-18 MED ORDER — ROPIVACAINE HCL 5 MG/ML IJ SOLN: 7.0000 mL | Freq: Once | INTRAMUSCULAR | Status: DC

## 2014-12-18 MED ORDER — TRIAMCINOLONE ACETONIDE 40 MG/ML IJ SUSP (RADIOLOGY)
40.0000 mg | Freq: Once | INTRAMUSCULAR | Status: DC
Start: 2014-12-18 — End: 2014-12-19

## 2014-12-23 ENCOUNTER — Ambulatory Visit: Payer: PRIVATE HEALTH INSURANCE

## 2015-07-17 DIAGNOSIS — Z6841 Body Mass Index (BMI) 40.0 and over, adult: Secondary | ICD-10-CM | POA: Diagnosis not present

## 2015-08-12 ENCOUNTER — Encounter (HOSPITAL_COMMUNITY): Payer: Self-pay | Admitting: Emergency Medicine

## 2015-08-12 ENCOUNTER — Emergency Department (HOSPITAL_COMMUNITY)
Admission: EM | Admit: 2015-08-12 | Discharge: 2015-08-12 | Disposition: A | Discharge status: Home / Self Care | Payer: Medicare Other | Attending: Emergency Medicine | Admitting: Emergency Medicine

## 2015-08-12 DIAGNOSIS — I10 Essential (primary) hypertension: Secondary | ICD-10-CM | POA: Insufficient documentation

## 2015-08-12 DIAGNOSIS — Z91040 Latex allergy status: Secondary | ICD-10-CM | POA: Diagnosis not present

## 2015-08-12 DIAGNOSIS — K920 Hematemesis: Secondary | ICD-10-CM | POA: Diagnosis not present

## 2015-08-12 DIAGNOSIS — G8929 Other chronic pain: Secondary | ICD-10-CM | POA: Diagnosis not present

## 2015-08-12 DIAGNOSIS — R112 Nausea with vomiting, unspecified: Secondary | ICD-10-CM | POA: Diagnosis not present

## 2015-08-12 DIAGNOSIS — Z8742 Personal history of other diseases of the female genital tract: Secondary | ICD-10-CM | POA: Diagnosis not present

## 2015-08-12 DIAGNOSIS — F419 Anxiety disorder, unspecified: Secondary | ICD-10-CM | POA: Insufficient documentation

## 2015-08-12 DIAGNOSIS — M1990 Unspecified osteoarthritis, unspecified site: Secondary | ICD-10-CM | POA: Diagnosis not present

## 2015-08-12 DIAGNOSIS — Z86018 Personal history of other benign neoplasm: Secondary | ICD-10-CM | POA: Diagnosis not present

## 2015-08-12 DIAGNOSIS — E559 Vitamin D deficiency, unspecified: Secondary | ICD-10-CM | POA: Insufficient documentation

## 2015-08-12 DIAGNOSIS — R042 Hemoptysis: Secondary | ICD-10-CM | POA: Diagnosis not present

## 2015-08-12 DIAGNOSIS — E785 Hyperlipidemia, unspecified: Secondary | ICD-10-CM | POA: Insufficient documentation

## 2015-08-12 DIAGNOSIS — Z79899 Other long term (current) drug therapy: Secondary | ICD-10-CM | POA: Insufficient documentation

## 2015-08-12 DIAGNOSIS — Z87891 Personal history of nicotine dependence: Secondary | ICD-10-CM | POA: Insufficient documentation

## 2015-08-12 DIAGNOSIS — R51 Headache: Secondary | ICD-10-CM | POA: Diagnosis not present

## 2015-08-12 DIAGNOSIS — Z87828 Personal history of other (healed) physical injury and trauma: Secondary | ICD-10-CM | POA: Insufficient documentation

## 2015-08-12 DIAGNOSIS — R110 Nausea: Secondary | ICD-10-CM | POA: Diagnosis not present

## 2015-08-12 DIAGNOSIS — Z7982 Long term (current) use of aspirin: Secondary | ICD-10-CM | POA: Diagnosis not present

## 2015-08-12 LAB — COMPREHENSIVE METABOLIC PANEL
ALT: 18 U/L (ref 14–54)
AST: 19 U/L (ref 15–41)
Albumin: 3.5 g/dL (ref 3.5–5.0)
Alkaline Phosphatase: 90 U/L (ref 38–126)
Anion gap: 8 (ref 5–15)
BUN: 12 mg/dL (ref 6–20)
CO2: 27 mmol/L (ref 22–32)
Calcium: 9 mg/dL (ref 8.9–10.3)
Chloride: 104 mmol/L (ref 101–111)
Creatinine, Ser: 0.73 mg/dL (ref 0.44–1.00)
GFR calc Af Amer: >60 mL/min (ref >60)
GFR calc non Af Amer: >60 mL/min (ref >60)
Glucose, Bld: 113 mg/dL — ABNORMAL HIGH (ref 65–99)
Potassium: 4 mmol/L (ref 3.5–5.1)
Sodium: 139 mmol/L (ref 135–145)
Total Bilirubin: 0.3 mg/dL (ref 0.3–1.2)
Total Protein: 6.9 g/dL (ref 6.5–8.1)

## 2015-08-12 LAB — CBC
HCT: 38.7 % (ref 36.0–46.0)
Hemoglobin: 12.8 g/dL (ref 12.0–15.0)
MCH: 29.9 pg (ref 26.0–34.0)
MCHC: 33.1 g/dL (ref 30.0–36.0)
MCV: 90.4 fL (ref 78.0–100.0)
Platelets: 217 10*3/uL (ref 150–400)
RBC: 4.28 MIL/uL (ref 3.87–5.11)
RDW: 13.5 % (ref 11.5–15.5)
WBC: 8.1 10*3/uL (ref 4.0–10.5)

## 2015-08-12 LAB — LIPASE, BLOOD: Lipase: 25 U/L (ref 11–51)

## 2015-08-12 MED ORDER — ONDANSETRON 4 MG PO TBDP
4.0000 mg | ORAL_TABLET | Freq: Once | ORAL | Status: AC | PRN
  Administered 2015-08-12: 4 mg via ORAL

## 2015-08-12 MED ORDER — PANTOPRAZOLE SODIUM 40 MG PO TBEC: 40.0000 mg | DELAYED_RELEASE_TABLET | Freq: Every day | ORAL | Status: DC

## 2015-08-12 MED ORDER — DIPHENHYDRAMINE HCL 50 MG/ML IJ SOLN
25.0000 mg | Freq: Once | INTRAMUSCULAR | Status: AC
  Administered 2015-08-12: 25 mg via INTRAVENOUS
  Filled 2015-08-12: qty 1

## 2015-08-12 MED ORDER — SODIUM CHLORIDE 0.9 % IV SOLN
1000.0000 mL | Freq: Once | INTRAVENOUS | Status: AC
  Administered 2015-08-12: 1000 mL via INTRAVENOUS

## 2015-08-12 MED ORDER — ONDANSETRON 4 MG PO TBDP
ORAL_TABLET | ORAL | Status: AC
  Filled 2015-08-12: qty 1

## 2015-08-12 MED ORDER — PANTOPRAZOLE SODIUM 40 MG IV SOLR
40.0000 mg | Freq: Once | INTRAVENOUS | Status: AC
  Administered 2015-08-12: 40 mg via INTRAVENOUS
  Filled 2015-08-12: qty 40

## 2015-08-12 MED ORDER — SODIUM CHLORIDE 0.9 % IV SOLN
1000.0000 mL | INTRAVENOUS | Status: DC
  Administered 2015-08-12: 1000 mL via INTRAVENOUS

## 2015-08-12 MED ORDER — METOCLOPRAMIDE HCL 5 MG/ML IJ SOLN
10.0000 mg | Freq: Once | INTRAMUSCULAR | Status: AC
  Administered 2015-08-12: 10 mg via INTRAVENOUS
  Filled 2015-08-12: qty 2

## 2015-08-12 MED ORDER — METOCLOPRAMIDE HCL 10 MG PO TABS: 10.0000 mg | ORAL_TABLET | Freq: Four times a day (QID) | ORAL | Status: DC | PRN

## 2015-08-12 NOTE — Discharge Instructions (Signed)
Talk with your doctor about possible referral to a gastroenterologist regarding the episode of vomiting blood.  Nausea and Vomiting Nausea is a sick feeling that often comes before throwing up (vomiting). Vomiting is a reflex where stomach contents come out of your mouth. Vomiting can cause severe loss of body fluids (dehydration). Children and elderly adults can become dehydrated quickly, especially if they also have diarrhea. Nausea and vomiting are symptoms of a condition or disease. It is important to find the cause of your symptoms. CAUSES   Direct irritation of the stomach lining. This irritation can result from increased acid production (gastroesophageal reflux disease), infection, food poisoning, taking certain medicines (such as nonsteroidal anti-inflammatory drugs), alcohol use, or tobacco use.  Signals from the brain.These signals could be caused by a headache, heat exposure, an inner ear disturbance, increased pressure in the brain from injury, infection, a tumor, or a concussion, pain, emotional stimulus, or metabolic problems.  An obstruction in the gastrointestinal tract (bowel obstruction).  Illnesses such as diabetes, hepatitis, gallbladder problems, appendicitis, kidney problems, cancer, sepsis, atypical symptoms of a heart attack, or eating disorders.  Medical treatments such as chemotherapy and radiation.  Receiving medicine that makes you sleep (general anesthetic) during surgery. DIAGNOSIS Your caregiver may ask for tests to be done if the problems do not improve after a few days. Tests may also be done if symptoms are severe or if the reason for the nausea and vomiting is not clear. Tests may include:  Urine tests.  Blood tests.  Stool tests.  Cultures (to look for evidence of infection).  X-rays or other imaging studies. Test results can help your caregiver make decisions about treatment or the need for additional tests. TREATMENT You need to stay well  hydrated. Drink frequently but in small amounts.You may wish to drink water, sports drinks, clear broth, or eat frozen ice pops or gelatin dessert to help stay hydrated.When you eat, eating slowly may help prevent nausea.There are also some antinausea medicines that may help prevent nausea. HOME CARE INSTRUCTIONS   Take all medicine as directed by your caregiver.  If you do not have an appetite, do not force yourself to eat. However, you must continue to drink fluids.  If you have an appetite, eat a normal diet unless your caregiver tells you differently.  Eat a variety of complex carbohydrates (rice, wheat, potatoes, bread), lean meats, yogurt, fruits, and vegetables.  Avoid high-fat foods because they are more difficult to digest.  Drink enough water and fluids to keep your urine clear or pale yellow.  If you are dehydrated, ask your caregiver for specific rehydration instructions. Signs of dehydration may include:  Severe thirst.  Dry lips and mouth.  Dizziness.  Dark urine.  Decreasing urine frequency and amount.  Confusion.  Rapid breathing or pulse. SEEK IMMEDIATE MEDICAL CARE IF:   You have blood or brown flecks (like coffee grounds) in your vomit.  You have black or bloody stools.  You have a severe headache or stiff neck.  You are confused.  You have severe abdominal pain.  You have chest pain or trouble breathing.  You do not urinate at least once every 8 hours.  You develop cold or clammy skin.  You continue to vomit for longer than 24 to 48 hours.  You have a fever. MAKE SURE YOU:   Understand these instructions.  Will watch your condition.  Will get help right away if you are not doing well or get worse.   This  information is not intended to replace advice given to you by your health care provider. Make sure you discuss any questions you have with your health care provider.   Document Released: 06/27/2005 Document Revised: 09/19/2011  Document Reviewed: 11/24/2010 Elsevier Interactive Patient Education 2016 [place] Headache Without Cause A headache is pain or discomfort felt around the head or neck area. The specific cause of a headache may not be found. There are many causes and types of headaches. A few common ones are:  Tension headaches.  Migraine headaches.  Cluster headaches.  Chronic daily headaches. HOME CARE INSTRUCTIONS  Watch your condition for any changes. Take these steps to help with your condition: Managing Pain  Take over-the-counter and prescription medicines only as told by your health care provider.  Lie down in a dark, quiet room when you have a headache.  If directed, apply ice to the head and neck area:  Put ice in a plastic bag.  Place a towel between your skin and the bag.  Leave the ice on for 20 minutes, 2-3 times per day.  Use a heating pad or hot shower to apply heat to the head and neck area as told by your health care provider.  Keep lights dim if bright lights bother you or make your headaches worse. Eating and Drinking  Eat meals on a regular schedule.  Limit alcohol use.  Decrease the amount of caffeine you drink, or stop drinking caffeine. General Instructions  Keep all follow-up visits as told by your health care provider. This is important.  Keep a headache journal to help find out what may trigger your headaches. For example, write down:  What you eat and drink.  How much sleep you get.  Any change to your diet or medicines.  Try massage or other relaxation techniques.  Limit stress.  Sit up straight, and do not tense your muscles.  Do not use tobacco products, including cigarettes, chewing tobacco, or e-cigarettes. If you need help quitting, ask your health care provider.  Exercise regularly as told by your health care provider.  Sleep on a regular schedule. Get 7-9 hours of sleep, or the amount recommended by your health care  provider. SEEK MEDICAL CARE IF:   Your symptoms are not helped by medicine.  You have a headache that is different from the usual headache.  You have nausea or you vomit.  You have a fever. SEEK IMMEDIATE MEDICAL CARE IF:   Your headache becomes severe.  You have repeated vomiting.  You have a stiff neck.  You have a loss of vision.  You have problems with speech.  You have pain in the eye or ear.  You have muscular weakness or loss of muscle control.  You lose your balance or have trouble walking.  You feel faint or pass out.  You have confusion.   This information is not intended to replace advice given to you by your health care provider. Make sure you discuss any questions you have with your health care provider.   Document Released: 06/27/2005 Document Revised: 03/18/2015 Document Reviewed: 10/20/2014 Elsevier Interactive Patient Education 2016 [employer]  Metoclopramide tablets What is this medicine? METOCLOPRAMIDE (met oh kloe PRA mide) is used to treat the symptoms of gastroesophageal reflux disease (GERD) like heartburn. It is also used to treat people with slow emptying of the stomach and intestinal tract. This medicine may be used for other purposes; ask your health care provider or pharmacist if you have  questions. What should I tell my health care provider before I take this medicine? They need to know if you have any of these conditions: -breast cancer -depression -diabetes -heart failure -high blood pressure -kidney disease -liver disease -Parkinson's disease or a movement disorder -pheochromocytoma -seizures -stomach obstruction, bleeding, or perforation -an unusual or allergic reaction to metoclopramide, procainamide, sulfites, other medicines, foods, dyes, or preservatives -pregnant or trying to get pregnant -breast-feeding How should I use this medicine? Take this medicine by mouth with a glass of water. Follow the directions on the  prescription label. Take this medicine on an empty stomach, about 30 minutes before eating. Take your doses at regular intervals. Do not take your medicine more often than directed. Do not stop taking except on the advice of your doctor or health care professional. A special MedGuide will be given to you by the pharmacist with each prescription and refill. Be sure to read this information carefully each time. Talk to your pediatrician regarding the use of this medicine in children. Special care may be needed. Overdosage: If you think you have taken too much of this medicine contact a poison control center or emergency room at once. NOTE: This medicine is only for you. Do not share this medicine with others. What if I miss a dose? If you miss a dose, take it as soon as you can. If it is almost time for your next dose, take only that dose. Do not take double or extra doses. What may interact with this medicine? -acetaminophen -cyclosporine -digoxin -medicines for blood pressure -medicines for diabetes, including insulin -medicines for hay fever and other allergies -medicines for depression, especially an Monoamine Oxidase Inhibitor (MAOI) -medicines for Parkinson's disease, like levodopa -medicines for sleep or for pain -tetracycline This list may not describe all possible interactions. Give your health care provider a list of all the medicines, herbs, non-prescription drugs, or dietary supplements you use. Also tell them if you smoke, drink alcohol, or use illegal drugs. Some items may interact with your medicine. What should I watch for while using this medicine? It may take a few weeks for your stomach condition to start to get better. However, do not take this medicine for longer than 12 weeks. The longer you take this medicine, and the more you take it, the greater your chances are of developing serious side effects. If you are an elderly patient, a female patient, or you have diabetes, you  may be at an increased risk for side effects from this medicine. Contact your doctor immediately if you start having movements you cannot control such as lip smacking, rapid movements of the tongue, involuntary or uncontrollable movements of the eyes, head, arms and legs, or muscle twitches and spasms. Patients and their families should watch out for worsening depression or thoughts of suicide. Also watch out for any sudden or severe changes in feelings such as feeling anxious, agitated, panicky, irritable, hostile, aggressive, impulsive, severely restless, overly excited and hyperactive, or not being able to sleep. If this happens, especially at the beginning of treatment or after a change in dose, call your doctor. Do not treat yourself for high fever. Ask your doctor or health care professional for advice. You may get drowsy or dizzy. Do not drive, use machinery, or do anything that needs mental alertness until you know how this drug affects you. Do not stand or sit up quickly, especially if you are an older patient. This reduces the risk of dizzy or fainting spells. Alcohol  can make you more drowsy and dizzy. Avoid alcoholic drinks. What side effects may I notice from receiving this medicine? Side effects that you should report to your doctor or health care professional as soon as possible: -allergic reactions like skin rash, itching or hives, swelling of the face, lips, or tongue -abnormal production of milk in females -breast enlargement in both males and females -change in the way you walk -difficulty moving, speaking or swallowing -drooling, lip smacking, or rapid movements of the tongue -excessive sweating -fever -involuntary or uncontrollable movements of the eyes, head, arms and legs -irregular heartbeat or palpitations -muscle twitches and spasms -unusually weak or tired Side effects that usually do not require medical attention (report to your doctor or health care professional if they  continue or are bothersome): -change in sex drive or performance -depressed mood -diarrhea -difficulty sleeping -headache -menstrual changes -restless or nervous This list may not describe all possible side effects. Call your doctor for medical advice about side effects. You may report side effects to FDA at 1-800-FDA-1088. Where should I keep my medicine? Keep out of the reach of children. Store at room temperature between 20 and 25 degrees C (68 and 77 degrees F). Protect from light. Keep container tightly closed. Throw away any unused medicine after the expiration date. NOTE: This sheet is a summary. It may not cover all possible information. If you have questions about this medicine, talk to your doctor, pharmacist, or health care provider.    2016, Elsevier/Gold Standard. (2011-10-25 13:04:38)  Pantoprazole tablets What is this medicine? PANTOPRAZOLE (pan TOE pra zole) prevents the production of acid in the stomach. It is used to treat gastroesophageal reflux disease (GERD), inflammation of the esophagus, and Zollinger-Ellison syndrome. This medicine may be used for other purposes; ask your health care provider or pharmacist if you have questions. What should I tell my health care provider before I take this medicine? They need to know if you have any of these conditions: -liver disease -low levels of magnesium in the blood -an unusual or allergic reaction to omeprazole, lansoprazole, pantoprazole, rabeprazole, other medicines, foods, dyes, or preservatives -pregnant or trying to get pregnant -breast-feeding How should I use this medicine? Take this medicine by mouth. Swallow the tablets whole with a drink of water. Follow the directions on the prescription label. Do not crush, break, or chew. Take your medicine at regular intervals. Do not take your medicine more often than directed. Talk to your pediatrician regarding the use of this medicine in children. While this drug may be  prescribed for children as young as 5 years for selected conditions, precautions do apply. Overdosage: If you think you have taken too much of this medicine contact a poison control center or emergency room at once. NOTE: This medicine is only for you. Do not share this medicine with others. What if I miss a dose? If you miss a dose, take it as soon as you can. If it is almost time for your next dose, take only that dose. Do not take double or extra doses. What may interact with this medicine? Do not take this medicine with any of the following medications: -atazanavir -nelfinavir This medicine may also interact with the following medications: -ampicillin -delavirdine -erlotinib -iron salts -medicines for fungal infections like ketoconazole, itraconazole and voriconazole -methotrexate -mycophenolate mofetil -warfarin This list may not describe all possible interactions. Give your health care provider a list of all the medicines, herbs, non-prescription drugs, or dietary supplements you use. Also tell  them if you smoke, drink alcohol, or use illegal drugs. Some items may interact with your medicine. What should I watch for while using this medicine? It can take several days before your stomach pain gets better. Check with your doctor or health care professional if your condition does not start to get better, or if it gets worse. You may need blood work done while you are taking this medicine. What side effects may I notice from receiving this medicine? Side effects that you should report to your doctor or health care professional as soon as possible: -allergic reactions like skin rash, itching or hives, swelling of the face, lips, or tongue -bone, muscle or joint pain -breathing problems -chest pain or chest tightness -dark yellow or brown urine -dizziness -fast, irregular heartbeat -feeling faint or lightheaded -fever or sore throat -muscle spasm -palpitations -redness, blistering,  peeling or loosening of the skin, including inside the mouth -seizures -tremors -unusual bleeding or bruising -unusually weak or tired -yellowing of the eyes or skin Side effects that usually do not require medical attention (Report these to your doctor or health care professional if they continue or are bothersome.): -constipation -diarrhea -dry mouth -headache -nausea This list may not describe all possible side effects. Call your doctor for medical advice about side effects. You may report side effects to FDA at 1-800-FDA-1088. Where should I keep my medicine? Keep out of the reach of children. Store at room temperature between 15 and 30 degrees C (59 and 86 degrees F). Protect from light and moisture. Throw away any unused medicine after the expiration date. NOTE: This sheet is a summary. It may not cover all possible information. If you have questions about this medicine, talk to your doctor, pharmacist, or health care provider.    2016, Elsevier/Gold Standard. (2014-08-15 14:45:56)

## 2015-08-12 NOTE — ED Provider Notes (Signed)
CSN: [id number]     Arrival date & time 08/12/15  1246 History   First MD Initiated Contact with Patient 08/12/15 1715     Chief Complaint  Patient presents with  . Hemoptysis     (Consider location/radiation/quality/duration/timing/severity/associated sxs/prior Treatment) The history is provided by the patient.   65 year old female states that she was having resistant vomiting at home which started 10 days ago and resolved a week ago. She had persistent nausea following that and vomited again this morning. There was approximately a tablespoon full of blood and when she vomited this morning. She has not vomited since then. She continues to have nausea. She also has a bifrontal headache which seems to get worse when her nausea intensified. Nausea seems to be improved momentarily aware if she eats a small amount of something like crackers. She denies fever or chills. She denies abdominal pain. Denies melena or hematochezia.   Past Medical History  Diagnosis Date  . Achilles tendinitis   . Acute medial meniscal tear   . Osteopenia   . Osteoarthritis     left knee  . Vitamin D deficiency   . Tendonitis   . Hypertension   . Hyperlipidemia   . Anxiety   . Chronic lower back pain   . Fibrocystic breast disease   . Insomnia   . Lipoma    Past Surgical History  Procedure Laterality Date  . Colonoscopy    . Esophagogastroduodenoscopy    . Abdominal hysterectomy    . Total thyroidectomy    . Achilles tendon surgery     Family History  Problem Relation Age of Onset  . Heart disease Father   . Heart attack Father 59  . Hyperlipidemia Father   . Hypertension Father    Social History  Substance Use Topics  . Smoking status: Former Smoker -- 0.25 packs/day for 10 years    Types: Cigarettes  . Smokeless tobacco: None  . Alcohol Use: No     Comment: social   OB History    No data available     Review of Systems  All other systems reviewed and are negative.     Allergies   Prednisone; Adhesive; and Latex  Home Medications   Prior to Admission medications   Medication Sig Start Date End Date Taking? Authorizing Provider  aspirin 81 MG tablet Take 81 mg by mouth daily.   Yes Historical Provider, MD  B Complex Vitamins (VITAMIN B COMPLEX PO) Take by mouth daily.   Yes Historical Provider, MD  bisoprolol (ZEBETA) 10 MG tablet Take 10 mg by mouth 2 (two) times daily.    Yes Historical Provider, MD  Calcium Citrate-Vitamin D (CALCIUM + D PO) Take 1 tablet by mouth 2 (two) times daily.   Yes Historical Provider, MD  Cholecalciferol (VITAMIN D) 2000 units tablet Take 6,000 Units by mouth daily.   Yes Historical Provider, MD  cloNIDine (CATAPRES) 0.1 MG tablet Take 0.1 mg by mouth 2 (two) times daily.   Yes Historical Provider, MD  DULoxetine (CYMBALTA) 30 MG capsule Take 30 mg by mouth daily.   Yes Historical Provider, MD  losartan-hydrochlorothiazide (HYZAAR) 100-25 MG per tablet Take 1 tablet by mouth daily.   Yes Historical Provider, MD  Multiple Vitamin (MULTIVITAMIN) tablet Take 1 tablet by mouth daily.   Yes Historical Provider, MD  omeprazole (PRILOSEC) 20 MG capsule Take 20 mg by mouth daily.   Yes Historical Provider, MD  rosuvastatin (CRESTOR) 20 MG tablet Take 20 mg  by mouth every evening.    Yes Historical Provider, MD  predniSONE (DELTASONE) 10 MG tablet Take 6 tablets  today, on day 2 take 5 tablets, day 3 take 4 tablets, day 4 take 3 tablets, day 5 take  2 tablets and 1 tablet the last day 11/30/14   Johnn Hai, PA-C   BP 128/79 mmHg  Pulse 87  Temp(Src) 97.9 F (36.6 C) (Oral)  Resp 18  SpO2 96% Physical Exam  Nursing note and vitals reviewed.  65 year old female, resting comfortably and in no acute distress. Vital signs are normal. Oxygen saturation is 96%, which is normal. Head is normocephalic and atraumatic. PERRLA, EOMI. Oropharynx is clear. Neck is nontender and supple without adenopathy or JVD. Back is nontender and there is no CVA  tenderness. Lungs are clear without rales, wheezes, or rhonchi. Chest is nontender. Heart has regular rate and rhythm without murmur. Abdomen is soft, flat, nontender without masses or hepatosplenomegaly and peristalsis is hypoactive. Extremities have no cyanosis or edema, full range of motion is present. Skin is warm and dry without rash. Neurologic: Mental status is normal, cranial nerves are intact, there are no motor or sensory deficits.  ED Course  Procedures (including critical care time) Labs Review Results for orders placed or performed during the hospital encounter of 08/12/15  Lipase, blood  Result Value Ref Range   Lipase 25 11 - 51 U/L  Comprehensive metabolic panel  Result Value Ref Range   Sodium 139 135 - 145 mmol/L   Potassium 4.0 3.5 - 5.1 mmol/L   Chloride 104 101 - 111 mmol/L   CO2 27 22 - 32 mmol/L   Glucose, Bld 113 (H) 65 - 99 mg/dL   BUN 12 6 - 20 mg/dL   Creatinine, Ser 0.73 0.44 - 1.00 mg/dL   Calcium 9.0 8.9 - 10.3 mg/dL   Total Protein 6.9 6.5 - 8.1 g/dL   Albumin 3.5 3.5 - 5.0 g/dL   AST 19 15 - 41 U/L   ALT 18 14 - 54 U/L   Alkaline Phosphatase 90 38 - 126 U/L   Total Bilirubin 0.3 0.3 - 1.2 mg/dL   GFR calc non Af Amer >60 >60 mL/min   GFR calc Af Amer >60 >60 mL/min   Anion gap 8 5 - 15  CBC  Result Value Ref Range   WBC 8.1 4.0 - 10.5 K/uL   RBC 4.28 3.87 - 5.11 MIL/uL   Hemoglobin 12.8 12.0 - 15.0 g/dL   HCT 38.7 36.0 - 46.0 %   MCV 90.4 78.0 - 100.0 fL   MCH 29.9 26.0 - 34.0 pg   MCHC 33.1 30.0 - 36.0 g/dL   RDW 13.5 11.5 - 15.5 %   Platelets 217 150 - 400 K/uL   I have personally reviewed and evaluated these images and lab results as part of my medical decision-making.   MDM   Final diagnoses:  Nausea and vomiting, vomiting of unspecified type    Nausea and vomiting with episode of hemoptysis. Hemoglobin is normal and vital signs are normal. BUN and creatinine are normal as are liver functions. No other source of bleeding  identified. Will check orthostatic vital signs. She'll be given IV fluids as well as metoclopramide with diphenhydramine. She is given a dose of pantoprazole.  She feels much better after above noted treatment. Orthostatic vital signs showed no significant change in heart rate or blood pressure. No indication of significant bleeding. She is discharged with prescriptions  for metoclopramide and pantoprazole and is referred back to PCP for consideration of possible referral to gastroenterology.  Delora Fuel, [place] [phone] [id number]

## 2015-08-12 NOTE — ED Notes (Signed)
Pt reports last week she was vomiting everytime she at from Monday to Thursday and then it stopped but the nausea persisted.Pt reports that she had one episode of vomiting blood this morning. Pt alert x4. NAD at this time.

## 2015-08-28 DIAGNOSIS — I129 Hypertensive chronic kidney disease with stage 1 through stage 4 chronic kidney disease, or unspecified chronic kidney disease: Secondary | ICD-10-CM | POA: Diagnosis not present

## 2015-08-28 DIAGNOSIS — Z87898 Personal history of other specified conditions: Secondary | ICD-10-CM | POA: Diagnosis not present

## 2015-08-28 DIAGNOSIS — E782 Mixed hyperlipidemia: Secondary | ICD-10-CM | POA: Diagnosis not present

## 2015-08-28 DIAGNOSIS — N182 Chronic kidney disease, stage 2 (mild): Secondary | ICD-10-CM | POA: Diagnosis not present

## 2015-08-28 DIAGNOSIS — K210 Gastro-esophageal reflux disease with esophagitis: Secondary | ICD-10-CM | POA: Diagnosis not present

## 2015-09-23 DIAGNOSIS — Z6841 Body Mass Index (BMI) 40.0 and over, adult: Secondary | ICD-10-CM | POA: Diagnosis not present

## 2015-09-24 ENCOUNTER — Ambulatory Visit (INDEPENDENT_AMBULATORY_CARE_PROVIDER_SITE_OTHER): Payer: Medicare Other

## 2015-09-24 ENCOUNTER — Encounter: Payer: Self-pay | Admitting: Podiatry

## 2015-09-24 ENCOUNTER — Ambulatory Visit (INDEPENDENT_AMBULATORY_CARE_PROVIDER_SITE_OTHER): Payer: Medicare Other | Admitting: Podiatry

## 2015-09-24 DIAGNOSIS — R52 Pain, unspecified: Secondary | ICD-10-CM

## 2015-09-24 DIAGNOSIS — M7732 Calcaneal spur, left foot: Secondary | ICD-10-CM

## 2015-09-24 DIAGNOSIS — M779 Enthesopathy, unspecified: Secondary | ICD-10-CM | POA: Diagnosis not present

## 2015-09-24 DIAGNOSIS — M79673 Pain in unspecified foot: Secondary | ICD-10-CM

## 2015-09-24 NOTE — Progress Notes (Signed)
   Subjective:    Patient ID: Amber Barnes, female    DOB: Nov 26, 1950, 65 y.o.   MRN: [id number]  HPI  65 year old female presents the also concerns of pain to the back of the left ankle for which she points to a bump off the posterior calcaneus which came up suddenly approximate 2 weeks ago. She states the areas only painful when she wears shoes and rubbing in the shoes. Denies any recent injury or trauma. There is been very mild swelling overlying the area without any erythema or increase in warmth. No tingling or numbness. No previous treatment. She does have a history of right Achilles surgery approximately 5 years ago. No other complaints.  Review of Systems  All other systems reviewed and are negative.      Objective:   Physical Exam General: AAO x3, NAD  Dermatological: Skin is warm, dry and supple bilateral. Nails x 10 are well manicured; remaining integument appears unremarkable at this time. There are no open sores, no preulcerative lesions, no rash or signs of infection present.  Vascular: Dorsalis Pedis artery and Posterior Tibial artery pedal pulses are 2/4 bilateral with immedate capillary fill time. Pedal hair growth present. No varicosities and no lower extremity edema present bilateral. There is no pain with calf compression, swelling, warmth, erythema.   Neruologic: Grossly intact via light touch bilateral. Vibratory intact via tuning fork bilateral. Protective threshold with Semmes Wienstein monofilament intact to all pedal sites bilateral. Patellar and Achilles deep tendon reflexes 2+ bilateral. No Babinski or clonus noted bilateral.   Musculoskeletal: On the posterior lateral aspect of the left calcaneus is a prominent retrocalcaneal exostosis. There is slight swelling overlying this area without any erythema or increase in warmth. There is mild to palpation overlying the spur. There is no pain on the course the Achilles tendon. Thompson test is negative. Mild equinus  present. No other areas of tenderness.  Gait: Unassisted, Nonantalgic.      Assessment & Plan:  65 year old female left retrocalcaneal exostosis -Treatment options discussed including all alternatives, risks, and complications -X-rays were obtained and reviewed with the patient. Retrocalcaneal exostosis present -Etiology of symptoms were discussed -Order compound cream, anti-inflammatory cream. She wishes to hold off on PO if possible -Dispensed silicone sleeve to protect the bump.  -S[employer]gear modifications -Follow-up 6 weeks if symptoms continue  Celesta Gentile, DPM

## 2015-09-24 NOTE — Patient Instructions (Signed)
Achilles Tendinitis With Rehab Achilles tendinitis is a disorder of the Achilles tendon. The Achilles tendon connects the large calf muscles (Gastrocnemius and Soleus) to the heel bone (calcaneus). This tendon is sometimes called the heel cord. It is important for pushing-off and standing on your toes and is important for walking, running, or jumping. Tendinitis is often caused by overuse and repetitive microtrauma. SYMPTOMS  Pain, tenderness, swelling, warmth, and redness may occur over the Achilles tendon even at rest.  Pain with pushing off, or flexing or extending the ankle.  Pain that is worsened after or during activity. CAUSES   Overuse sometimes seen with rapid increase in exercise programs or in sports requiring running and jumping.  Poor physical conditioning (strength and flexibility or endurance).  Running sports, especially training running down hills.  Inadequate warm-up before practice or play or failure to stretch before participation.  Injury to the tendon. PREVENTION   Warm up and stretch before practice or competition.  Allow time for adequate rest and recovery between practices and competition.  Keep up conditioning.  Keep up ankle and leg flexibility.  Improve or keep muscle strength and endurance.  Improve cardiovascular fitness.  Use proper technique.  Use proper equipment (shoes, skates).  To help prevent recurrence, taping, protective strapping, or an adhesive bandage may be recommended for several weeks after healing is complete. PROGNOSIS   Recovery may take weeks to several months to heal.  Longer recovery is expected if symptoms have been prolonged.  Recovery is usually quicker if the inflammation is due to a direct blow as compared with overuse or sudden strain. RELATED COMPLICATIONS   Healing time will be prolonged if the condition is not correctly treated. The injury must be given plenty of time to heal.  Symptoms can reoccur if  activity is resumed too soon.  Untreated, tendinitis may increase the risk of tendon rupture requiring additional time for recovery and possibly surgery. TREATMENT   The first treatment consists of rest anti-inflammatory medication, and ice to relieve the pain.  Stretching and strengthening exercises after resolution of pain will likely help reduce the risk of recurrence. Referral to a physical therapist or athletic trainer for further evaluation and treatment may be helpful.  A walking boot or cast may be recommended to rest the Achilles tendon. This can help break the cycle of inflammation and microtrauma.  Arch supports (orthotics) may be prescribed or recommended by your caregiver as an adjunct to therapy and rest.  Surgery to remove the inflamed tendon lining or degenerated tendon tissue is rarely necessary and has shown less than predictable results. MEDICATION   Nonsteroidal anti-inflammatory medications, such as aspirin and ibuprofen, may be used for pain and inflammation relief. Do not take within 7 days before surgery. Take these as directed by your caregiver. Contact your caregiver immediately if any bleeding, stomach upset, or signs of allergic reaction occur. Other minor pain relievers, such as acetaminophen, may also be used.  Pain relievers may be prescribed as necessary by your caregiver. Do not take prescription pain medication for longer than 4 to 7 days. Use only as directed and only as much as you need.  Cortisone injections are rarely indicated. Cortisone injections may weaken tendons and predispose to rupture. It is better to give the condition more time to heal than to use them. HEAT AND COLD  Cold is used to relieve pain and reduce inflammation for acute and chronic Achilles tendinitis. Cold should be applied for 10 to 15 minutes every   2 to 3 hours for inflammation and pain and immediately after any activity that aggravates your symptoms. Use ice packs or an ice  massage.  Heat may be used before performing stretching and strengthening activities prescribed by your caregiver. Use a heat pack or a warm soak. SEEK MEDICAL CARE IF:  Symptoms get worse or do not improve in 2 weeks despite treatment.  New, unexplained symptoms develop. Drugs used in treatment may produce side effects. EXERCISES RANGE OF MOTION (ROM) AND STRETCHING EXERCISES - Achilles Tendinitis  These exercises may help you when beginning to rehabilitate your injury. Your symptoms may resolve with or without further involvement from your physician, physical therapist or athletic trainer. While completing these exercises, remember:   Restoring tissue flexibility helps normal motion to return to the joints. This allows healthier, less painful movement and activity.  An effective stretch should be held for at least 30 seconds.  A stretch should never be painful. You should only feel a gentle lengthening or release in the stretched tissue. STRETCH - Gastroc, Standing   Place hands on wall.  Extend right / left leg, keeping the front knee somewhat bent.  Slightly point your toes inward on your back foot.  Keeping your right / left heel on the floor and your knee straight, shift your weight toward the wall, not allowing your back to arch.  You should feel a gentle stretch in the right / left calf. Hold this position for __________ seconds. Repeat __________ times. Complete this stretch __________ times per day. STRETCH - Soleus, Standing   Place hands on wall.  Extend right / left leg, keeping the other knee somewhat bent.  Slightly point your toes inward on your back foot.  Keep your right / left heel on the floor, bend your back knee, and slightly shift your weight over the back leg so that you feel a gentle stretch deep in your back calf.  Hold this position for __________ seconds. Repeat __________ times. Complete this stretch __________ times per day. STRETCH -  Gastrocsoleus, Standing  Note: This exercise can place a lot of stress on your foot and ankle. Please complete this exercise only if specifically instructed by your caregiver.   Place the ball of your right / left foot on a step, keeping your other foot firmly on the same step.  Hold on to the wall or a rail for balance.  Slowly lift your other foot, allowing your body weight to press your heel down over the edge of the step.  You should feel a stretch in your right / left calf.  Hold this position for __________ seconds.  Repeat this exercise with a slight bend in your knee. Repeat __________ times. Complete this stretch __________ times per day.  STRENGTHENING EXERCISES - Achilles Tendinitis These exercises may help you when beginning to rehabilitate your injury. They may resolve your symptoms with or without further involvement from your physician, physical therapist or athletic trainer. While completing these exercises, remember:   Muscles can gain both the endurance and the strength needed for everyday activities through controlled exercises.  Complete these exercises as instructed by your physician, physical therapist or athletic trainer. Progress the resistance and repetitions only as guided.  You may experience muscle soreness or fatigue, but the pain or discomfort you are trying to eliminate should never worsen during these exercises. If this pain does worsen, stop and make certain you are following the directions exactly. If the pain is still present after adjustments,   discontinue the exercise until you can discuss the trouble with your clinician. STRENGTH - Plantar-flexors   Sit with your right / left leg extended. Holding onto both ends of a rubber exercise band/tubing, loop it around the ball of your foot. Keep a slight tension in the band.  Slowly push your toes away from you, pointing them downward.  Hold this position for __________ seconds. Return slowly, controlling the  tension in the band/tubing. Repeat __________ times. Complete this exercise __________ times per day.  STRENGTH - Plantar-flexors   Stand with your feet shoulder width apart. Steady yourself with a wall or table using as little support as needed.  Keeping your weight evenly spread over the width of your feet, rise up on your toes.*  Hold this position for __________ seconds. Repeat __________ times. Complete this exercise __________ times per day.  *If this is too easy, shift your weight toward your right / left leg until you feel challenged. Ultimately, you may be asked to do this exercise with your right / left foot only. STRENGTH - Plantar-flexors, Eccentric  Note: This exercise can place a lot of stress on your foot and ankle. Please complete this exercise only if specifically instructed by your caregiver.   Place the balls of your feet on a step. With your hands, use only enough support from a wall or rail to keep your balance.  Keep your knees straight and rise up on your toes.  Slowly shift your weight entirely to your right / left toes and pick up your opposite foot. Gently and with controlled movement, lower your weight through your right / left foot so that your heel drops below the level of the step. You will feel a slight stretch in the back of your calf at the end position.  Use the healthy leg to help rise up onto the balls of both feet, then lower weight only on the right / left leg again. Build up to 15 repetitions. Then progress to 3 consecutive sets of 15 repetitions.*  After completing the above exercise, complete the same exercise with a slight knee bend (about 30 degrees). Again, build up to 15 repetitions. Then progress to 3 consecutive sets of 15 repetitions.* Perform this exercise __________ times per day.  *When you easily complete 3 sets of 15, your physician, physical therapist or athletic trainer may advise you to add resistance by wearing a backpack filled with  additional weight. STRENGTH - Plantar Flexors, Seated   Sit on a chair that allows your feet to rest flat on the ground. If necessary, sit at the edge of the chair.  Keeping your toes firmly on the ground, lift your right / left heel as far as you can without increasing any discomfort in your ankle. Repeat __________ times. Complete this exercise __________ times a day. *If instructed by your physician, physical therapist or athletic trainer, you may add ____________________ of resistance by placing a weighted object on your right / left knee.   This information is not intended to replace advice given to you by your health care provider. Make sure you discuss any questions you have with your health care provider.   Document Released: 01/26/2005 Document Revised: 07/18/2014 Document Reviewed: 10/09/2008 Elsevier Interactive Patient Education 2016 [employer]  

## 2015-09-29 ENCOUNTER — Ambulatory Visit: Payer: Self-pay | Admitting: Podiatry

## 2015-10-09 DIAGNOSIS — F331 Major depressive disorder, recurrent, moderate: Secondary | ICD-10-CM | POA: Diagnosis not present

## 2015-10-09 DIAGNOSIS — F5105 Insomnia due to other mental disorder: Secondary | ICD-10-CM | POA: Diagnosis not present

## 2015-10-09 DIAGNOSIS — F419 Anxiety disorder, unspecified: Secondary | ICD-10-CM | POA: Diagnosis not present

## 2015-11-05 ENCOUNTER — Ambulatory Visit (INDEPENDENT_AMBULATORY_CARE_PROVIDER_SITE_OTHER): Payer: Medicare Other | Admitting: Podiatry

## 2015-11-05 ENCOUNTER — Encounter: Payer: Self-pay | Admitting: Podiatry

## 2015-11-05 VITALS — BP 137/79 | HR 72 | Resp 18

## 2015-11-05 DIAGNOSIS — M7732 Calcaneal spur, left foot: Secondary | ICD-10-CM

## 2015-11-05 DIAGNOSIS — M7730 Calcaneal spur, unspecified foot: Secondary | ICD-10-CM | POA: Insufficient documentation

## 2015-11-05 DIAGNOSIS — M779 Enthesopathy, unspecified: Secondary | ICD-10-CM

## 2015-11-05 NOTE — Progress Notes (Signed)
Patient ID: Amber Barnes, female   DOB: 1950-12-24, 65 y.o.   MRN: [id number]  Subjective: 65 year old female presents the office today for follow up evaluation of left Achilles tendinitis, retrocalcaneal exostosis. She states that she is feeling "great". She has been using the compound cream which she states helps a lot as well as stretching and icing. She has tried changing issues as well as helps. Denies any systemic complaints such as fevers, chills, nausea, vomiting. No acute changes since last appointment, and no other complaints at this time.   Objective: AAO x3, NAD DP/PT pulses palpable bilaterally, CRT less than 3 seconds There is a prominent retrocalcaneal exostosis and the left posterior heel. There is very mild tenderness overlying this area and this appears to be significantly improved compared to last appointment. There is no amount edema, erythema, increase in warmth. There is no tenderness in the course the Achilles tendon. Thompson test is negative. No areas of pinpoint bony tenderness or pain with vibratory sensation. MMT 5/5, ROM WNL. No edema, erythema, increase in warmth to bilateral lower extremities.  No open lesions or pre-ulcerative lesions.  No pain with calf compression, swelling, warmth, erythema  Assessment: 65 year old female resolving Achilles tendinitis left foot  Plan: -All treatment options discussed with the patient including all alternatives, risks, complications.  -At this time continue with stretching, icing daily. Continue compound cream as needed. Symptoms not resolved within 4 weeks to call the office or if there is any problems in the meantime. At this point she is doing well see her back as needed. -Patient encouraged to call the office with any questions, concerns, change in symptoms.   Celesta Gentile, DPM

## 2015-12-01 ENCOUNTER — Other Ambulatory Visit: Payer: Self-pay | Admitting: Family Medicine

## 2015-12-01 DIAGNOSIS — Z1231 Encounter for screening mammogram for malignant neoplasm of breast: Secondary | ICD-10-CM

## 2015-12-15 ENCOUNTER — Ambulatory Visit: Payer: Medicare Other

## 2015-12-15 ENCOUNTER — Ambulatory Visit
Admission: RE | Admit: 2015-12-15 | Discharge: 2015-12-15 | Disposition: A | Discharge status: Home / Self Care | Payer: Medicare Other | Source: Ambulatory Visit | Attending: Family Medicine | Admitting: Family Medicine

## 2015-12-15 DIAGNOSIS — Z1231 Encounter for screening mammogram for malignant neoplasm of breast: Secondary | ICD-10-CM | POA: Insufficient documentation

## 2015-12-16 ENCOUNTER — Encounter: Payer: Self-pay | Admitting: Podiatry

## 2015-12-28 ENCOUNTER — Telehealth: Payer: Self-pay | Admitting: *Deleted

## 2015-12-28 MED ORDER — NONFORMULARY OR COMPOUNDED ITEM: Status: DC

## 2015-12-28 NOTE — Telephone Encounter (Signed)
Refill request for anti-inflammatory pain cream. Dr. Jacqualyn Posey ordered refill +6.  Faxed to [facility].

## 2016-04-14 ENCOUNTER — Encounter: Payer: Self-pay | Admitting: Cardiovascular Disease

## 2016-04-14 ENCOUNTER — Ambulatory Visit (INDEPENDENT_AMBULATORY_CARE_PROVIDER_SITE_OTHER): Payer: Medicare Other | Admitting: Cardiovascular Disease

## 2016-04-14 VITALS — BP 118/80 | HR 81 | Ht 65.0 in | Wt 283.8 lb

## 2016-04-14 DIAGNOSIS — I10 Essential (primary) hypertension: Secondary | ICD-10-CM | POA: Diagnosis not present

## 2016-04-14 DIAGNOSIS — E785 Hyperlipidemia, unspecified: Secondary | ICD-10-CM | POA: Diagnosis not present

## 2016-04-14 DIAGNOSIS — Z23 Encounter for immunization: Secondary | ICD-10-CM | POA: Diagnosis not present

## 2016-04-14 NOTE — Progress Notes (Signed)
Cardiology Office Note  Date:  04/14/2016   ID:  Amber Barnes, DOB [birth year], MRN [id number]  PCP:  Charletta Cousin, MD (Inactive)   Chief Complaint  Patient presents with  . other    1 yr f/u c/o chest pain. Meds reviewed verbally with pt.    HPI:  Amber Barnes is a  65 year old woman with history of obesity, ostial arthritis, hyperlipidemia, hypertension, who presents for follow-up of her hyperlipidemia, hypertension  In general she reports that she is doing well, continues to have difficulty with her weight Was Previously on Belviq , stopped for dry mouth  She has problems with sleep, Bad sleep, dreams  Unable to exercise secondary to chronic foot problem, left foot Needs surgery Also Having back pain  Denies any shortness of breath, chest discomfort.  Reports blood pressure has been well-controlled on her current medications.  She denies any prior cardiac history. Prior workup 8-10 years ago at [facility] with [facility], told it was normal  Previous lab work from May 2017 reviewed with her Total cholesterol 156, LDL 74, creatinine 0.69 hematocrit 39  EKG on today's visit shows normal sinus rhythm with rate 81 bpm, poor R-wave progression through the anterior precordial leads likely secondary to lead placement  In terms of her past medical history, She did not tolerate Lipitor and cholesterol climbed up to 285, LDL 196 On Lipitor total cholesterol 168, LDL 88 in October 2015 No recent lipid panel available on Crestor She is scheduled to have repeat lab work at the end of May 2016 with primary care  She reports having a prior smoking history for 5 years, then smoked only small amounts   PMH:   has a past medical history of Achilles tendinitis; Acute medial meniscal tear; Anxiety; Arthritis; Chronic lower back pain; Fibrocystic breast disease; Hyperlipidemia; Hypertension; Insomnia; Lipoma; Osteoarthritis; Osteopenia; Tendonitis; and Vitamin D deficiency.  PSH:     Past Surgical History:  Procedure Laterality Date  . ABDOMINAL HYSTERECTOMY    . ACHILLES TENDON SURGERY    . BACK SURGERY    . COLONOSCOPY    . ESOPHAGOGASTRODUODENOSCOPY    . THYROIDECTOMY     thyroid lobectomy secondary to a benign nodule  . TOTAL THYROIDECTOMY      Current Outpatient Prescriptions  Medication Sig Dispense Refill  . aspirin 81 MG tablet Take 81 mg by mouth daily.    . B Complex Vitamins (VITAMIN B COMPLEX PO) Take by mouth daily.    . bisoprolol (ZEBETA) 10 MG tablet Take 10 mg by mouth 2 (two) times daily.     Marland Kitchen CALCIUM PO Take by mouth.    . Cholecalciferol (VITAMIN D PO) Take by mouth.    . cloNIDine (CATAPRES) 0.1 MG tablet Take 0.1 mg by mouth 2 (two) times daily.    . DULoxetine (CYMBALTA) 60 MG capsule Take 120 mg by mouth daily.    Marland Kitchen losartan-hydrochlorothiazide (HYZAAR) 100-25 MG per tablet Take 1 tablet by mouth daily.    . Multiple Vitamin (MULTIVITAMIN) tablet Take 1 tablet by mouth daily.    . NONFORMULARY OR COMPOUNDED ITEM [facility]:  Antiinflammatory Cream - Diclofenac 3%, Baclofen 2%, Cyclobenzaprine 2%, Lidocaine 2%, apply 1-2 grams to affected are 3-4 times daily. 120 each 6  . pantoprazole (PROTONIX) 40 MG tablet Take 1 tablet (40 mg total) by mouth daily. 30 tablet 0  . rosuvastatin (CRESTOR) 20 MG tablet Take 20 mg by mouth every evening.      No current facility-administered medications  for this visit.      Allergies:   Prednisone; Adhesive [tape]; and Latex   Social History:  The patient  reports that she has quit smoking. Her smoking use included Cigarettes. She has a 2.50 pack-year smoking history. She has never used smokeless tobacco. She reports that she does not drink alcohol or use drugs.   Family History:   family history includes Breast cancer in her paternal aunt; Cancer in her father, mother, and sister; Heart attack (age of onset: 27) in her father; Heart disease in her father; Hyperlipidemia in her father;  Hypertension in her father.    Review of Systems: Review of Systems  Constitutional: Negative.   Respiratory: Negative.   Cardiovascular: Negative.   Gastrointestinal: Negative.   Musculoskeletal: Negative.   Neurological: Negative.   Psychiatric/Behavioral: Negative.   All other systems reviewed and are negative.    PHYSICAL EXAM: VS:  BP 118/80 (BP Location: Left Arm, Patient Position: Sitting, Cuff Size: Large)   Pulse 81   Ht 5\' 5"  (1.651 m)   Wt 283 lb 12 oz (128.7 kg)   BMI 47.22 kg/m  , BMI Body mass index is 47.22 kg/m. GEN: Well nourished, well developed, in no acute distress , obese HEENT: normal  Neck: no JVD, carotid bruits, or masses Cardiac: RRR; no murmurs, rubs, or gallops,no edema  Respiratory:  clear to auscultation bilaterally, normal work of breathing GI: soft, nontender, nondistended, + BS MS: no deformity or atrophy  Skin: warm and dry, no rash Neuro:  Strength and sensation are intact Psych: euthymic mood, full affect    Recent Labs: 08/12/2015: ALT 18; BUN 12; Creatinine, Ser 0.73; Hemoglobin 12.8; Platelets 217; Potassium 4.0; Sodium 139    Lipid Panel Lab Results  Component Value Date   CHOL 175 05/14/2013   HDL 52 05/14/2013   LDLCALC 101 (H) 05/14/2013   TRIG 108 05/14/2013      Wt Readings from Last 3 Encounters:  04/14/16 283 lb 12 oz (128.7 kg)  12/18/14 282 lb (127.9 kg)  11/30/14 292 lb (132.5 kg)       ASSESSMENT AND PLAN:  Essential hypertension - Plan: EKG 12-Lead Blood pressure is well controlled on today's visit. No changes made to the medications.  Hyperlipidemia, unspecified hyperlipidemia type - Plan: EKG 12-Lead Cholesterol is at goal on the current lipid regimen. No changes to the medications were made.  Encounter for immunization - Plan: Flu Vaccine QUAD 36+ mos IM Flu shot given today  Obesity, Class III, BMI 40-49.9 (morbid obesity) ([place]) Various strategies for weight loss discussed with her She did  contemplate surgical options in the past Recommended low carbohydrate diet, low impact exercise   Total encounter time more than 25 minutes  Greater than 50% was spent in counseling and coordination of care with the patient   Disposition:   F/U  12 months   Orders Placed This Encounter  Procedures  . Flu Vaccine QUAD 36+ mos IM  . EKG 12-Lead     Signed, Esmond Plants, M.D., Ph.D. 04/14/2016  [place], [place]

## 2016-04-14 NOTE — Patient Instructions (Addendum)
Medication Instructions:   No medication changes made  Continue low carbo diet  Labwork:  No new labs needed  Testing/Procedures:  No further testing at this time   Follow-Up: It was a pleasure seeing you in the office today. Please call us if you have new issues that need to be addressed before your next appt.  [phone]  Your physician wants you to follow-up in: 12 months.  You will receive a reminder letter in the mail two months in advance. If you don't receive a letter, please call our office to schedule the follow-up appointment.  If you need a refill on your cardiac medications before your next appointment, please call your pharmacy.

## 2016-05-04 ENCOUNTER — Encounter: Payer: Self-pay | Admitting: Family

## 2016-05-04 ENCOUNTER — Ambulatory Visit (INDEPENDENT_AMBULATORY_CARE_PROVIDER_SITE_OTHER): Payer: Medicare Other | Admitting: Family

## 2016-05-04 VITALS — BP 130/98 | HR 91 | Temp 98.4°F | Ht 65.0 in | Wt 281.8 lb

## 2016-05-04 DIAGNOSIS — F32A Depression, unspecified: Secondary | ICD-10-CM

## 2016-05-04 DIAGNOSIS — F418 Other specified anxiety disorders: Secondary | ICD-10-CM

## 2016-05-04 DIAGNOSIS — K219 Gastro-esophageal reflux disease without esophagitis: Secondary | ICD-10-CM

## 2016-05-04 DIAGNOSIS — M7732 Calcaneal spur, left foot: Secondary | ICD-10-CM

## 2016-05-04 DIAGNOSIS — I10 Essential (primary) hypertension: Secondary | ICD-10-CM | POA: Diagnosis not present

## 2016-05-04 DIAGNOSIS — J302 Other seasonal allergic rhinitis: Secondary | ICD-10-CM

## 2016-05-04 DIAGNOSIS — Z7689 Persons encountering health services in other specified circumstances: Secondary | ICD-10-CM

## 2016-05-04 DIAGNOSIS — F419 Anxiety disorder, unspecified: Secondary | ICD-10-CM

## 2016-05-04 DIAGNOSIS — F329 Major depressive disorder, single episode, unspecified: Secondary | ICD-10-CM

## 2016-05-04 NOTE — Progress Notes (Signed)
Subjective:    Patient ID: Amber Barnes, female    DOB: [birth year], 65 y.o.   MRN: [id number]  CC: Amber Barnes is a 65 y.o. female who presents today to establish care.  HPI: Patient here as a new patient to establish care. Prior care had been with Dr. Marcello Moores, and Judson Roch, [place]. We are requesting those records.  She also notes an itchy bump on right side of chest, for past month. Has gotten bigger and would like referral to dermatology.   Hypertension-Stable. follows with cardiology, Dr. [name]  GERD- Take OTC protonix every day. Has noticed with weight loss, reflux improved. No trouble swallowing, hoarseness, unintentional weight loss.   Depression and anxiety- Stable on Cymbalta and buspar. Has panic attack 3-4x per year.   Working on weight loss and has lost 35 pounds over the past year. Left heel bone spur and is seeing Dr Sabra Heck considering surgery. Plans to start walking after surgery.   Overdue on health maintenance. Due for screening labs, preventative care, CPE.  H/o seasonal allergies. Uses OTC Zyrtec PRN.       HISTORY:  Past Medical History:  Diagnosis Date  . Achilles tendinitis   . Acute medial meniscal tear   . Allergy   . Anxiety   . Arthritis   . Chronic lower back pain   . COPD (chronic obstructive pulmonary disease) ([place])   . Depression   . Fibrocystic breast disease   . GERD (gastroesophageal reflux disease)   . Hyperlipidemia   . Hypertension   . Insomnia   . Lipoma   . Osteoarthritis    left knee  . Osteopenia   . Tendonitis   . Thyroid disease   . Vitamin D deficiency    Past Surgical History:  Procedure Laterality Date  . ABDOMINAL HYSTERECTOMY     ovaries left  . ACHILLES TENDON SURGERY    . BACK SURGERY    . COLONOSCOPY    . ESOPHAGOGASTRODUODENOSCOPY    . THYROIDECTOMY     thyroid lobectomy secondary to a benign nodule  . TOTAL THYROIDECTOMY     Family History  Problem Relation Age of Onset  . Cancer Sister 20   ovarian  . Cancer Father 54    colon  . Heart disease Father   . Heart attack Father 89    died from MI  . Hyperlipidemia Father   . Hypertension Father   . Cancer Mother     leukemia  . Breast cancer Paternal Aunt     40's    Allergies: Prednisone; Adhesive [tape]; and Latex Current Outpatient Prescriptions on File Prior to Visit  Medication Sig Dispense Refill  . aspirin 81 MG tablet Take 81 mg by mouth daily.    . B Complex Vitamins (VITAMIN B COMPLEX PO) Take by mouth daily.    . bisoprolol (ZEBETA) 10 MG tablet Take 10 mg by mouth 2 (two) times daily.     Marland Kitchen CALCIUM PO Take by mouth.    . Cholecalciferol (VITAMIN D PO) Take by mouth.    . cloNIDine (CATAPRES) 0.1 MG tablet Take 0.1 mg by mouth 2 (two) times daily.    . DULoxetine (CYMBALTA) 60 MG capsule Take 120 mg by mouth daily.    Marland Kitchen losartan-hydrochlorothiazide (HYZAAR) 100-25 MG per tablet Take 1 tablet by mouth daily.    . Multiple Vitamin (MULTIVITAMIN) tablet Take 1 tablet by mouth daily.    . NONFORMULARY OR COMPOUNDED ITEM [facility]:  Antiinflammatory Cream -  Diclofenac 3%, Baclofen 2%, Cyclobenzaprine 2%, Lidocaine 2%, apply 1-2 grams to affected are 3-4 times daily. 120 each 6  . pantoprazole (PROTONIX) 40 MG tablet Take 1 tablet (40 mg total) by mouth daily. 30 tablet 0  . rosuvastatin (CRESTOR) 20 MG tablet Take 20 mg by mouth every evening.     . [DISCONTINUED] omeprazole (PRILOSEC) 20 MG capsule Take 20 mg by mouth daily.     No current facility-administered medications on file prior to visit.     Social History  Substance Use Topics  . Smoking status: Former Smoker    Packs/day: 0.25    Years: 10.00    Types: Cigarettes  . Smokeless tobacco: Never Used     Comment: smoked for 10 years  . Alcohol use Yes     Comment: social, occasional    Review of Systems  Constitutional: Negative for chills and fever.  Respiratory: Negative for cough.   Cardiovascular: Negative for chest pain and  palpitations.  Gastrointestinal: Negative for nausea and vomiting.  Psychiatric/Behavioral: Negative for suicidal ideas. The patient is nervous/anxious.       Objective:    BP (!) 130/98   Pulse 91   Temp 98.4 F (36.9 C) (Oral)   Ht 5\' 5"  (1.651 m)   Wt 281 lb 12.8 oz (127.8 kg)   SpO2 97%   BMI 46.89 kg/m  BP Readings from Last 3 Encounters:  05/04/16 (!) 130/98  04/14/16 118/80  11/05/15 137/79   Wt Readings from Last 3 Encounters:  05/04/16 281 lb 12.8 oz (127.8 kg)  04/14/16 283 lb 12 oz (128.7 kg)  12/18/14 282 lb (127.9 kg)    Physical Exam  Constitutional: She appears well-developed and well-nourished.  Eyes: Conjunctivae are normal.  Cardiovascular: Normal rate, regular rhythm, normal heart sounds and normal pulses.   Pulmonary/Chest: Effort normal and breath sounds normal. She has no wheezes. She has no rhonchi. She has no rales.  Neurological: She is alert.  Skin: Skin is warm and dry.     Psychiatric: She has a normal mood and affect. Her speech is normal and behavior is normal. Thought content normal.  Vitals reviewed.      Assessment & Plan:   Problem List Items Addressed This Visit      Cardiovascular and Mediastinum   Essential hypertension    Controlled on current regimen. She follows with Dr. [name], cardiology as well. Will continue current regimen.         Respiratory   Seasonal rhinitis    Chronic. Stable. Controlled with PRN antihistamine.        Digestive   GERD (gastroesophageal reflux disease)    Stable. Controlled on Protonix. Will continue current regimen.        Musculoskeletal and Integument   Heel spur    Chronic. Stable. Pain of left heel spur inhibits patient's exercise. She is seeing Dr. Sabra Heck, orthopedic, and considering surgery. She plans to get back to exercise program after surgery.        Other   Anxiety and depression    Stable. Will continue current regimen and monitoring.      Encounter to establish  care - Primary    Reviewed past medical social and family history with patient. Placed a referral for dermatology for annual skin exam for lesion on right side of chest. Screening labs ordered and patient will make appointment for CPE.      Relevant Orders   Ambulatory referral to Dermatology   CBC  with Differential/Platelet   Comprehensive metabolic panel   Hemoglobin A1c   Hepatitis C antibody   HIV antibody   Lipid panel   TSH   VITAMIN D 25 Hydroxy (Vit-D Deficiency, Fractures)    Other Visit Diagnoses   None.      I am having Ms. Thibodaux maintain her aspirin, CALCIUM PO, Cholecalciferol (VITAMIN D PO), NONFORMULARY OR COMPOUNDED ITEM, DULoxetine, multivitamin, bisoprolol, B Complex Vitamins (VITAMIN B COMPLEX PO), rosuvastatin, losartan-hydrochlorothiazide, cloNIDine, pantoprazole, and busPIRone.   Meds ordered this encounter  Medications  . busPIRone (BUSPAR) 10 MG tablet    Return precautions given.   Risks, benefits, and alternatives of the medications and treatment plan prescribed today were discussed, and patient expressed understanding.   Education regarding symptom management and diagnosis given to patient on AVS.  Continue to follow with [name], FNP for routine health maintenance.   [name] and I agreed with plan.   [name], FNP

## 2016-05-04 NOTE — Patient Instructions (Addendum)
Please be sure to make follow-up appointment for your physical at check out. Fasting lab appt as well  Pleasure meeting you.

## 2016-05-05 DIAGNOSIS — J302 Other seasonal allergic rhinitis: Secondary | ICD-10-CM | POA: Insufficient documentation

## 2016-05-05 DIAGNOSIS — K219 Gastro-esophageal reflux disease without esophagitis: Secondary | ICD-10-CM | POA: Insufficient documentation

## 2016-05-05 DIAGNOSIS — Z0000 Encounter for general adult medical examination without abnormal findings: Secondary | ICD-10-CM | POA: Insufficient documentation

## 2016-05-05 NOTE — Assessment & Plan Note (Signed)
Stable. Will continue current regimen and monitoring.

## 2016-05-05 NOTE — Assessment & Plan Note (Addendum)
Controlled on current regimen. She follows with Dr. [name], cardiology as well. Will continue current regimen.

## 2016-05-05 NOTE — Assessment & Plan Note (Addendum)
Chronic. Stable. Pain of left heel spur inhibits patient's exercise. She is seeing Dr. Sabra Heck, orthopedic, and considering surgery. She plans to get back to exercise program after surgery.

## 2016-05-05 NOTE — Assessment & Plan Note (Signed)
Chronic. Stable. Controlled with PRN antihistamine.

## 2016-05-05 NOTE — Assessment & Plan Note (Addendum)
Reviewed past medical social and family history with patient. Placed a referral for dermatology for annual skin exam for lesion on right side of chest. Screening labs ordered and patient will make appointment for CPE.

## 2016-05-05 NOTE — Assessment & Plan Note (Signed)
Stable. Controlled on Protonix. Will continue current regimen.

## 2016-05-12 ENCOUNTER — Other Ambulatory Visit (INDEPENDENT_AMBULATORY_CARE_PROVIDER_SITE_OTHER): Payer: Medicare Other

## 2016-05-12 DIAGNOSIS — Z7689 Persons encountering health services in other specified circumstances: Secondary | ICD-10-CM

## 2016-05-12 LAB — CBC WITH DIFFERENTIAL/PLATELET
Basophils Absolute: 0 10*3/uL (ref 0.0–0.1)
Basophils Relative: 0.3 % (ref 0.0–3.0)
Eosinophils Absolute: 0.1 10*3/uL (ref 0.0–0.7)
Eosinophils Relative: 1 % (ref 0.0–5.0)
HCT: 38.3 % (ref 36.0–46.0)
Hemoglobin: 12.7 g/dL (ref 12.0–15.0)
Lymphocytes Relative: 29.5 % (ref 12.0–46.0)
Lymphs Abs: 1.8 10*3/uL (ref 0.7–4.0)
MCHC: 33.3 g/dL (ref 30.0–36.0)
MCV: 87.8 fl (ref 78.0–100.0)
Monocytes Absolute: 0.4 10*3/uL (ref 0.1–1.0)
Monocytes Relative: 6.3 % (ref 3.0–12.0)
Neutro Abs: 3.8 10*3/uL (ref 1.4–7.7)
Neutrophils Relative %: 62.9 % (ref 43.0–77.0)
Platelets: 194 10*3/uL (ref 150.0–400.0)
RBC: 4.36 Mil/uL (ref 3.87–5.11)
RDW: 14.3 % (ref 11.5–15.5)
WBC: 6.1 10*3/uL (ref 4.0–10.5)

## 2016-05-12 LAB — COMPREHENSIVE METABOLIC PANEL
ALT: 19 U/L (ref 0–35)
AST: 17 U/L (ref 0–37)
Albumin: 3.9 g/dL (ref 3.5–5.2)
Alkaline Phosphatase: 80 U/L (ref 39–117)
BUN: 15 mg/dL (ref 6–23)
CO2: 31 mEq/L (ref 19–32)
Calcium: 9.4 mg/dL (ref 8.4–10.5)
Chloride: 103 mEq/L (ref 96–112)
Creatinine, Ser: 0.68 mg/dL (ref 0.40–1.20)
GFR: 92.75 mL/min (ref >60.00)
Glucose, Bld: 100 mg/dL — ABNORMAL HIGH (ref 70–99)
Potassium: 4 mEq/L (ref 3.5–5.1)
Sodium: 141 mEq/L (ref 135–145)
Total Bilirubin: 0.4 mg/dL (ref 0.2–1.2)
Total Protein: 6.5 g/dL (ref 6.0–8.3)

## 2016-05-12 LAB — LIPID PANEL
Cholesterol: 168 mg/dL (ref 0–200)
HDL: 62.5 mg/dL (ref >39.00)
LDL Cholesterol: 81 mg/dL (ref 0–99)
NonHDL: 105.88
Total CHOL/HDL Ratio: 3
Triglycerides: 125 mg/dL (ref 0.0–149.0)
VLDL: 25 mg/dL (ref 0.0–40.0)

## 2016-05-12 LAB — VITAMIN D 25 HYDROXY (VIT D DEFICIENCY, FRACTURES): VITD: 57.98 ng/mL (ref 30.00–100.00)

## 2016-05-12 LAB — HEMOGLOBIN A1C: Hgb A1c MFr Bld: 5.3 % (ref 4.6–6.5)

## 2016-05-12 LAB — TSH: TSH: 3.07 u[IU]/mL (ref 0.35–4.50)

## 2016-05-13 LAB — HEPATITIS C ANTIBODY: HCV Ab: NEGATIVE

## 2016-05-13 LAB — HIV ANTIBODY (ROUTINE TESTING W REFLEX): HIV 1&2 Ab, 4th Generation: NONREACTIVE

## 2016-05-17 ENCOUNTER — Ambulatory Visit (INDEPENDENT_AMBULATORY_CARE_PROVIDER_SITE_OTHER): Payer: Medicare Other | Admitting: Family

## 2016-05-17 ENCOUNTER — Encounter: Payer: Self-pay | Admitting: Family

## 2016-05-17 ENCOUNTER — Other Ambulatory Visit: Payer: Self-pay | Admitting: Family

## 2016-05-17 ENCOUNTER — Telehealth: Payer: Self-pay | Admitting: Family

## 2016-05-17 VITALS — BP 130/90 | HR 82 | Temp 98.1°F | Wt 286.8 lb

## 2016-05-17 DIAGNOSIS — M62838 Other muscle spasm: Secondary | ICD-10-CM

## 2016-05-17 LAB — POCT URINALYSIS DIPSTICK
Bilirubin, UA: NEGATIVE
Blood, UA: NEGATIVE
Glucose, UA: NEGATIVE
Ketones, UA: NEGATIVE
Leukocytes, UA: NEGATIVE
Nitrite, UA: NEGATIVE
Protein, UA: NEGATIVE
Spec Grav, UA: >=1.03
Urobilinogen, UA: 0.2
pH, UA: 6

## 2016-05-17 MED ORDER — TIZANIDINE HCL 2 MG PO CAPS
2.0000 mg | ORAL_CAPSULE | Freq: Every evening | ORAL | 0 refills | Status: DC | PRN
Start: 2016-05-17 — End: 2016-07-29

## 2016-05-17 MED ORDER — TIZANIDINE HCL 2 MG PO CAPS: 2.0000 mg | ORAL_CAPSULE | Freq: Every evening | ORAL | 0 refills | Status: DC | PRN

## 2016-05-17 NOTE — Patient Instructions (Signed)
Suspect muscle spasm  If there is no improvement in your symptoms, or if there is any worsening of symptoms, or if you have any additional concerns, please return for re-evaluation; or, if we are closed, consider going to the Emergency Room for evaluation if symptoms urgent.   Muscle Cramps and Spasms Muscle cramps and spasms occur when a muscle or muscles tighten and you have no control over this tightening (involuntary muscle contraction). They are a common problem and can develop in any muscle. The most common place is in the calf muscles of the leg. Both muscle cramps and muscle spasms are involuntary muscle contractions, but they also have differences:   Muscle cramps are sporadic and painful. They may last a few seconds to a quarter of an hour. Muscle cramps are often more forceful and last longer than muscle spasms.  Muscle spasms may or may not be painful. They may also last just a few seconds or much longer. CAUSES  It is uncommon for cramps or spasms to be due to a serious underlying problem. In many cases, the cause of cramps or spasms is unknown. Some common causes are:   Overexertion.   Overuse from repetitive motions (doing the same thing over and over).   Remaining in a certain position for a long period of time.   Improper preparation, form, or technique while performing a sport or activity.   Dehydration.   Injury.   Side effects of some medicines.   Abnormally low levels of the salts and ions in your blood (electrolytes), especially potassium and calcium. This could happen if you are taking water pills (diuretics) or you are pregnant.  Some underlying medical problems can make it more likely to develop cramps or spasms. These include, but are not limited to:   Diabetes.   Parkinson disease.   Hormone disorders, such as thyroid problems.   Alcohol abuse.   Diseases specific to muscles, joints, and bones.   Blood vessel disease where not enough  blood is getting to the muscles.  HOME CARE INSTRUCTIONS   Stay well hydrated. Drink enough water and fluids to keep your urine clear or pale yellow.  It may be helpful to massage, stretch, and relax the affected muscle.  For tight or tense muscles, use a warm towel, heating pad, or hot shower water directed to the affected area.  If you are sore or have pain after a cramp or spasm, applying ice to the affected area may relieve discomfort.  Put ice in a plastic bag.  Place a towel between your skin and the bag.  Leave the ice on for 15-20 minutes, 03-04 times a day.  Medicines used to treat a known cause of cramps or spasms may help reduce their frequency or severity. Only take over-the-counter or prescription medicines as directed by your caregiver. SEEK MEDICAL CARE IF:  Your cramps or spasms get more severe, more frequent, or do not improve over time.  MAKE SURE YOU:   Understand these instructions.  Will watch your condition.  Will get help right away if you are not doing well or get worse.   This information is not intended to replace advice given to you by your health care provider. Make sure you discuss any questions you have with your health care provider.   Document Released: 12/17/2001 Document Revised: 10/22/2012 Document Reviewed: 06/13/2012 Elsevier Interactive Patient Education [employer].

## 2016-05-17 NOTE — Progress Notes (Signed)
Subjective:    Patient ID: Amber Barnes, female    DOB: [birth year], 65 y.o.   MRN: [id number]  CC: Amber Barnes is a 65 y.o. female who presents today for an acute visit.    HPI: Patient here for acute visit with chief complaint of right flank pain for past 2-3 weeks, worsening and has been lasting for past 2-3 days more constant. Thought pulled muscle at first since they moved 3 weeks ago.Pain is not colickly. Last night could get comfortable to sleep. Stabbing pain. Pain not a/w meals. Pain worse with twisting motion and leaning. Improves with warm shower.  No fever, chills, dysuria, abdominal pain, numbness, tingling. Having regular BMs.  Has tried ibuprofen without relief.     No history of renal stones.    HISTORY:  Past Medical History:  Diagnosis Date  . Achilles tendinitis   . Acute medial meniscal tear   . Allergy   . Anxiety   . Arthritis   . Chronic lower back pain   . COPD (chronic obstructive pulmonary disease) ([place])   . Depression   . Fibrocystic breast disease   . GERD (gastroesophageal reflux disease)   . Hyperlipidemia   . Hypertension   . Insomnia   . Lipoma   . Osteoarthritis    left knee  . Osteopenia   . Tendonitis   . Thyroid disease   . Vitamin D deficiency    Past Surgical History:  Procedure Laterality Date  . ABDOMINAL HYSTERECTOMY     ovaries left  . ACHILLES TENDON SURGERY    . BACK SURGERY    . COLONOSCOPY    . ESOPHAGOGASTRODUODENOSCOPY    . THYROIDECTOMY     thyroid lobectomy secondary to a benign nodule  . TOTAL THYROIDECTOMY     Family History  Problem Relation Age of Onset  . Cancer Sister 31    ovarian  . Cancer Father 16    colon  . Heart disease Father   . Heart attack Father 51    died from MI  . Hyperlipidemia Father   . Hypertension Father   . Cancer Mother     leukemia  . Breast cancer Paternal Aunt     40's    Allergies: Prednisone; Adhesive [tape]; and Latex Current Outpatient Prescriptions on File  Prior to Visit  Medication Sig Dispense Refill  . aspirin 81 MG tablet Take 81 mg by mouth daily.    . B Complex Vitamins (VITAMIN B COMPLEX PO) Take by mouth daily.    . bisoprolol (ZEBETA) 10 MG tablet Take 10 mg by mouth 2 (two) times daily.     . busPIRone (BUSPAR) 10 MG tablet     . CALCIUM PO Take by mouth.    . Cholecalciferol (VITAMIN D PO) Take by mouth.    . cloNIDine (CATAPRES) 0.1 MG tablet Take 0.1 mg by mouth 2 (two) times daily.    . DULoxetine (CYMBALTA) 60 MG capsule Take 120 mg by mouth daily.    Marland Kitchen losartan-hydrochlorothiazide (HYZAAR) 100-25 MG per tablet Take 1 tablet by mouth daily.    . Multiple Vitamin (MULTIVITAMIN) tablet Take 1 tablet by mouth daily.    . NONFORMULARY OR COMPOUNDED ITEM [facility]:  Antiinflammatory Cream - Diclofenac 3%, Baclofen 2%, Cyclobenzaprine 2%, Lidocaine 2%, apply 1-2 grams to affected are 3-4 times daily. 120 each 6  . pantoprazole (PROTONIX) 40 MG tablet Take 1 tablet (40 mg total) by mouth daily. 30 tablet 0  . rosuvastatin (  CRESTOR) 20 MG tablet Take 20 mg by mouth every evening.     . [DISCONTINUED] omeprazole (PRILOSEC) 20 MG capsule Take 20 mg by mouth daily.     No current facility-administered medications on file prior to visit.     Social History  Substance Use Topics  . Smoking status: Former Smoker    Packs/day: 0.25    Years: 10.00    Types: Cigarettes  . Smokeless tobacco: Never Used     Comment: smoked for 10 years  . Alcohol use Yes     Comment: social, occasional    Review of Systems  Constitutional: Negative for chills and fever.  Respiratory: Negative for cough.   Cardiovascular: Negative for chest pain and palpitations.  Gastrointestinal: Negative for abdominal distention, abdominal pain, nausea and vomiting.  Genitourinary: Positive for flank pain. Negative for dysuria and frequency.      Objective:    BP 130/90   Pulse 82   Temp 98.1 F (36.7 C) (Oral)   Wt 286 lb 12.8 oz (130.1 kg)   SpO2  96%   BMI 47.73 kg/m    Physical Exam  Constitutional: She appears well-developed and well-nourished.  Eyes: Conjunctivae are normal.  Cardiovascular: Normal rate, regular rhythm, normal heart sounds and normal pulses.   Pulmonary/Chest: Effort normal and breath sounds normal. She has no wheezes. She has no rhonchi. She has no rales.  Abdominal: Soft. Normal appearance and bowel sounds are normal. She exhibits no distension, no fluid wave, no ascites and no mass. There is no tenderness. There is no rigidity, no rebound, no guarding and no CVA tenderness.  Musculoskeletal:       Lumbar back: She exhibits tenderness and spasm. She exhibits normal range of motion, no bony tenderness, no edema, no deformity and no pain.       Back:  Neurological: She is alert.  Skin: Skin is warm and dry.  Psychiatric: She has a normal mood and affect. Her speech is normal and behavior is normal. Thought content normal.  Vitals reviewed.      Assessment & Plan:   1. Muscle spasm Symptoms consistent with muscle spasm. Advised conservative therapy including any, gentle stretching, and muscle relaxant at bedtime. UA Negative for leukocytes, nitrites, blood.No CVA tenderness.   - tizanidine (ZANAFLEX) 2 MG capsule; Take 1 capsule (2 mg total) by mouth at bedtime as needed for muscle spasms.  Dispense: 15 capsule; Refill: 0 - POCT urinalysis dipstick    I am having Ms. Bluett start on tizanidine. I am also having her maintain her aspirin, CALCIUM PO, Cholecalciferol (VITAMIN D PO), NONFORMULARY OR COMPOUNDED ITEM, DULoxetine, multivitamin, bisoprolol, B Complex Vitamins (VITAMIN B COMPLEX PO), rosuvastatin, losartan-hydrochlorothiazide, cloNIDine, pantoprazole, and busPIRone.   Meds ordered this encounter  Medications  . tizanidine (ZANAFLEX) 2 MG capsule    Sig: Take 1 capsule (2 mg total) by mouth at bedtime as needed for muscle spasms.    Dispense:  15 capsule    Refill:  0    Order Specific  Question:   Supervising Provider    Answer:   Crecencio Mc [[id number]]    Return precautions given.   Risks, benefits, and alternatives of the medications and treatment plan prescribed today were discussed, and patient expressed understanding.   Education regarding symptom management and diagnosis given to patient on AVS.  Continue to follow with [name], FNP for routine health maintenance.   [name] and I agreed with plan.   [name], FNP

## 2016-05-17 NOTE — Telephone Encounter (Signed)
Pt called at 7:51 am on 11/7. She is complaining of pain under her arm. Sent call to triage.

## 2016-05-17 NOTE — Progress Notes (Signed)
Pre visit review using our clinic review tool, if applicable. No additional management support is needed unless otherwise documented below in the visit note. 

## 2016-05-18 ENCOUNTER — Ambulatory Visit: Payer: Medicare Other | Admitting: Family

## 2016-05-29 ENCOUNTER — Other Ambulatory Visit: Payer: Self-pay | Admitting: Family

## 2016-05-29 DIAGNOSIS — M62838 Other muscle spasm: Secondary | ICD-10-CM

## 2016-06-08 ENCOUNTER — Ambulatory Visit (INDEPENDENT_AMBULATORY_CARE_PROVIDER_SITE_OTHER): Payer: Medicare Other | Admitting: Family

## 2016-06-08 ENCOUNTER — Encounter: Payer: Self-pay | Admitting: Family

## 2016-06-08 VITALS — BP 120/78 | HR 92 | Temp 97.8°F | Ht 65.0 in | Wt 287.8 lb

## 2016-06-08 DIAGNOSIS — Z8041 Family history of malignant neoplasm of ovary: Secondary | ICD-10-CM | POA: Insufficient documentation

## 2016-06-08 DIAGNOSIS — I10 Essential (primary) hypertension: Secondary | ICD-10-CM | POA: Diagnosis not present

## 2016-06-08 DIAGNOSIS — Z0001 Encounter for general adult medical examination with abnormal findings: Secondary | ICD-10-CM | POA: Diagnosis not present

## 2016-06-08 DIAGNOSIS — R42 Dizziness and giddiness: Secondary | ICD-10-CM

## 2016-06-08 DIAGNOSIS — Z0000 Encounter for general adult medical examination without abnormal findings: Secondary | ICD-10-CM

## 2016-06-08 HISTORY — DX: Dizziness and giddiness: R42

## 2016-06-08 NOTE — Assessment & Plan Note (Signed)
Discussed lack of guidelines for monitoring. Based on sister's history, Pending CT abdomen and pelvis.

## 2016-06-08 NOTE — Assessment & Plan Note (Addendum)
BP on lower end of normal. No orthostatic hypotension ( see extended vitals). Will continue current regimen for now and advised to monitor BP more closely during dizziness episodes. Suspect lack of hydration may be playing a role as patient is on diuretic therapy. Regimen may also be too aggressive. Advised trial of taking 0.1mg  clonidine at bedtime and to trial skipping the morning dose. Follow up in one month.

## 2016-06-08 NOTE — Progress Notes (Signed)
Pre visit review using our clinic review tool, if applicable. No additional management support is needed unless otherwise documented below in the visit note. 

## 2016-06-08 NOTE — Assessment & Plan Note (Addendum)
Reassured by normal neurologic exam. Suspect poor PO intake and aggressive BP management may be contributing. Patient and I agreed to not pursue head imaging at this time and to monitor BP, increase PO intake. Follow up one month.

## 2016-06-08 NOTE — Patient Instructions (Addendum)
Need records of colonoscopy and DEXA scan.   Suspect dizziness related to poor hydration. If doesn't improve with conservative treatment, please make follow up.  For post menopausal women, guidelines recommend a diet with 1200 mg of Calcium per day. If you are eating calcium rich foods, you do not need a calcium supplement. The body better absorbs the calcium that you eat over supplementation. If you do supplement, I recommend not supplementing the full 1200 mg/ day as this can lead to increased risk of cardiovascular disease. I recommend Calcium Citrate over the counter, and you may take a total of 600 to 800 mg per day in divided doses with meals for best absorption.   For bone health, you need adequate vitamin D, and I recommend you supplement as it is harder to do so with diet alone. I recommend cholecalciferol 800 units daily.  Also, please ensure you are following a diet high in calcium -- research shows better outcomes with dietary sources including kale, yogurt, broccolii, cheese, okra, almonds- to name a few.     Also remember that exercise is a great medicine for maintain and preserve bone health. Advise moderate exercise for 30 minutes , 3 times per week.

## 2016-06-08 NOTE — Progress Notes (Signed)
Subjective:    Patient ID: Amber Barnes, female    DOB: [birth year], 65 y.o.   MRN: [id number]  CC: Amber Barnes is a 65 y.o. female who presents today for physical exam.    HPI: Here for CPE.   Complains of dizziness and 'car sickness' for 6 days, worsening. Episodes last for few hours , resolves with dramimine. Triggered when drop head and when staring out car window as passenger such as looking at Christmas lights out of car last night. Resolves on it's own. Endorses nausea, diaphoresis. "believes clammy because so nauseas.' No tinnitus, hearing changes.  No sinus pressure, cough. No syncope, exertional chest pain or pressure, numbness or tingling radiating to left arm or jaw, palpitations,frequent headaches, changes in vision, or shortness of breath.   HTN- Compliant. Concerned that not drinking enough water which may be contributing to dizziness. Notices after clonidine being tired. Average BP at home 135/82.    Family h/o ovarian cancer. No abdominal distention.        Colorectal Cancer Screening: Per patient done in 2014. H/o polyps. Believes on 5 year schedule. No records.  Breast Cancer Screening: Mammogram UTD, benign 12/2015. Cervical Cancer Screening: partial hysterectomy, no GYN cancer, abnormal Pap.  Bone Health screening/DEXA for 65+: No increased fracture risk. Done 2016, normal per patient. Takes calcium 500mg  and vitamin D 600mg .   Lung Cancer Screening: Doesn't have 30 year pack year history and age > 38 years. Immunizations       Tetanus - Due        Pneumococcal - Not a candidate for. Labs: Done Exercise: None right now due to left bone spur.  Alcohol use: Occasional Smoking/tobacco use: Former smoker, 15-20 years.  Regular dental exams: UTD Wears seat belt: Yes.  HISTORY:  Past Medical History:  Diagnosis Date  . Achilles tendinitis   . Acute medial meniscal tear   . Allergy   . Anxiety   . Arthritis   . Chronic lower back pain   . COPD  (chronic obstructive pulmonary disease) ([place])   . Depression   . Fibrocystic breast disease   . GERD (gastroesophageal reflux disease)   . Hyperlipidemia   . Hypertension   . Insomnia   . Lipoma   . Osteoarthritis    left knee  . Osteopenia   . Tendonitis   . Thyroid disease   . Vitamin D deficiency     Past Surgical History:  Procedure Laterality Date  . ABDOMINAL HYSTERECTOMY     ovaries left  . ACHILLES TENDON SURGERY    . BACK SURGERY    . COLONOSCOPY    . ESOPHAGOGASTRODUODENOSCOPY    . THYROIDECTOMY     thyroid lobectomy secondary to a benign nodule  . TOTAL THYROIDECTOMY     Family History  Problem Relation Age of Onset  . Cancer Sister 25    ovarian  . Cancer Father 20    colon  . Heart disease Father   . Heart attack Father 42    died from MI  . Hyperlipidemia Father   . Hypertension Father   . Cancer Mother     leukemia  . Breast cancer Paternal Aunt     40's      ALLERGIES: Prednisone; Adhesive [tape]; and Latex  Current Outpatient Prescriptions on File Prior to Visit  Medication Sig Dispense Refill  . aspirin 81 MG tablet Take 81 mg by mouth daily.    . B Complex Vitamins (VITAMIN B COMPLEX PO)  Take by mouth daily.    . bisoprolol (ZEBETA) 10 MG tablet Take 10 mg by mouth 2 (two) times daily.     . busPIRone (BUSPAR) 10 MG tablet     . CALCIUM PO Take by mouth.    . Cholecalciferol (VITAMIN D PO) Take by mouth.    . cloNIDine (CATAPRES) 0.1 MG tablet Take 0.1 mg by mouth 2 (two) times daily.    . DULoxetine (CYMBALTA) 60 MG capsule Take 120 mg by mouth daily.    Marland Kitchen losartan-hydrochlorothiazide (HYZAAR) 100-25 MG per tablet Take 1 tablet by mouth daily.    . Multiple Vitamin (MULTIVITAMIN) tablet Take 1 tablet by mouth daily.    . NONFORMULARY OR COMPOUNDED ITEM [facility]:  Antiinflammatory Cream - Diclofenac 3%, Baclofen 2%, Cyclobenzaprine 2%, Lidocaine 2%, apply 1-2 grams to affected are 3-4 times daily. 120 each 6  . pantoprazole  (PROTONIX) 40 MG tablet Take 1 tablet (40 mg total) by mouth daily. 30 tablet 0  . rosuvastatin (CRESTOR) 20 MG tablet Take 20 mg by mouth every evening.     . tizanidine (ZANAFLEX) 2 MG capsule Take 1 capsule (2 mg total) by mouth at bedtime as needed for muscle spasms. 15 capsule 0  . [DISCONTINUED] omeprazole (PRILOSEC) 20 MG capsule Take 20 mg by mouth daily.     No current facility-administered medications on file prior to visit.     Social History  Substance Use Topics  . Smoking status: Former Smoker    Packs/day: 0.25    Years: 10.00    Types: Cigarettes  . Smokeless tobacco: Never Used     Comment: smoked for 10 years  . Alcohol use Yes     Comment: social, occasional    Review of Systems  Constitutional: Positive for diaphoresis. Negative for chills, fever and unexpected weight change.  HENT: Negative for congestion and sinus pressure.   Eyes: Negative for visual disturbance.  Respiratory: Negative for cough.   Cardiovascular: Negative for chest pain, palpitations and leg swelling.  Gastrointestinal: Negative for abdominal distention, nausea and vomiting.  Musculoskeletal: Negative for arthralgias and myalgias.  Skin: Negative for rash.  Neurological: Positive for dizziness. Negative for syncope and headaches.  Hematological: Negative for adenopathy.  Psychiatric/Behavioral: Negative for confusion.      Objective:    BP 120/78   Pulse 92   Temp 97.8 F (36.6 C) (Oral)   Ht 5\' 5"  (1.651 m)   Wt 287 lb 12.8 oz (130.5 kg)   SpO2 97%   BMI 47.89 kg/m   BP Readings from Last 3 Encounters:  06/08/16 120/78  05/17/16 130/90  05/04/16 (!) 130/98   Wt Readings from Last 3 Encounters:  06/08/16 287 lb 12.8 oz (130.5 kg)  05/17/16 286 lb 12.8 oz (130.1 kg)  05/04/16 281 lb 12.8 oz (127.8 kg)    Physical Exam  Constitutional: She appears well-developed and well-nourished.  HENT:  Head: Normocephalic and atraumatic.  Right Ear: Hearing, tympanic membrane,  external ear and ear canal normal. No swelling or tenderness. Tympanic membrane is not erythematous and not bulging. No middle ear effusion.  Left Ear: Tympanic membrane, external ear and ear canal normal. No swelling or tenderness. Tympanic membrane is not erythematous and not bulging.  No middle ear effusion.  Nose: Nose normal. No rhinorrhea. Right sinus exhibits no maxillary sinus tenderness and no frontal sinus tenderness. Left sinus exhibits no maxillary sinus tenderness and no frontal sinus tenderness.  Mouth/Throat: Uvula is midline, oropharynx is clear  and moist and mucous membranes are normal. No posterior oropharyngeal edema or posterior oropharyngeal erythema.  Eyes: Conjunctivae, EOM and lids are normal. Pupils are equal, round, and reactive to light. Lids are everted and swept, no foreign bodies found.  Fundus normal bilaterally.   Cardiovascular: Normal rate, regular rhythm, normal heart sounds and normal pulses.   Pulmonary/Chest: Effort normal and breath sounds normal. She has no wheezes. She has no rhonchi. She has no rales.  Lymphadenopathy:       Head (right side): No submental, no submandibular, no tonsillar, no preauricular, no posterior auricular and no occipital adenopathy present.       Head (left side): No submental, no submandibular, no tonsillar, no preauricular, no posterior auricular and no occipital adenopathy present.    She has no cervical adenopathy.       Right cervical: No superficial cervical, no deep cervical and no posterior cervical adenopathy present.      Left cervical: No superficial cervical, no deep cervical and no posterior cervical adenopathy present.  Neurological: She is alert. She has normal strength. No cranial nerve deficit or sensory deficit. She displays a negative Romberg sign.  Reflex Scores:      Bicep reflexes are 2+ on the right side and 2+ on the left side.      Patellar reflexes are 2+ on the right side and 2+ on the left side. Grip equal  and strong bilateral upper extremities. Gait strong and steady. Able to perform rapid alternating movement without difficulty.   Skin: Skin is warm and dry.  Psychiatric: She has a normal mood and affect. Her speech is normal and behavior is normal. Thought content normal.  Vitals reviewed.      Assessment & Plan:   Problem List Items Addressed This Visit      Cardiovascular and Mediastinum   Essential hypertension    BP on lower end of normal. No orthostatic hypotension ( see extended vitals). Will continue current regimen for now and advised to monitor BP more closely during dizziness episodes. Suspect lack of hydration may be playing a role as patient is on diuretic therapy. Regimen may also be too aggressive. Advised trial of taking 0.1mg  clonidine at bedtime and to trial skipping the morning dose. Follow up in one month.         Other   Routine physical examination - Primary    Per patient, colonoscopy done 2014 however unable to see records. States due 2019. Patient will bring these to office.  Mammogram UTD. Defers pelvic exam based on preference and absence of symptoms. No longer does pap smears. Defer DEXA due to no increased risk at this time. Requesting records of DEXA as well. Advised tdap at local pharmacy. Labs done prior. CT abdomen pelvis ordered due to sister's h/o ovarian cancer. Discussed lack of guidelines regarding screening intervals; we will re-discuss at next year CPE how we will follow. Encouraged exercise and hope she can return to program after heel spur surgery.       Dizziness    Reassured by normal neurologic exam. Suspect poor PO intake and aggressive BP management may be contributing. Patient and I agreed to not pursue head imaging at this time and to monitor BP, increase PO intake. Follow up one month.       Family history of ovarian cancer     Discussed lack of guidelines for monitoring. Based on sister's history, Pending CT abdomen and pelvis.       Relevant Orders  CT Abdomen Pelvis W Contrast       I am having Ms. Qian maintain her aspirin, CALCIUM PO, Cholecalciferol (VITAMIN D PO), NONFORMULARY OR COMPOUNDED ITEM, DULoxetine, multivitamin, bisoprolol, B Complex Vitamins (VITAMIN B COMPLEX PO), rosuvastatin, losartan-hydrochlorothiazide, cloNIDine, pantoprazole, busPIRone, and tizanidine.   No orders of the defined types were placed in this encounter.   Return precautions given.   Risks, benefits, and alternatives of the medications and treatment plan prescribed today were discussed, and patient expressed understanding.   Education regarding symptom management and diagnosis given to patient on AVS.   Continue to follow with [name], FNP for routine health maintenance.   [name] and I agreed with plan.   [name], FNP

## 2016-06-08 NOTE — Assessment & Plan Note (Addendum)
Per patient, colonoscopy done 2014 however unable to see records. States due 2019. Patient will bring these to office.  Mammogram UTD. Defers pelvic exam based on preference and absence of symptoms. No longer does pap smears. Defer DEXA due to no increased risk at this time. Requesting records of DEXA as well. Advised tdap at local pharmacy. Labs done prior. CT abdomen pelvis ordered due to sister's h/o ovarian cancer. Discussed lack of guidelines regarding screening intervals; we will re-discuss at next year CPE how we will follow. Encouraged exercise and hope she can return to program after heel spur surgery.

## 2016-06-21 ENCOUNTER — Ambulatory Visit (INDEPENDENT_AMBULATORY_CARE_PROVIDER_SITE_OTHER): Payer: Medicare Other | Admitting: Family

## 2016-06-21 ENCOUNTER — Encounter: Payer: Self-pay | Admitting: Family

## 2016-06-21 VITALS — BP 136/80 | HR 94 | Temp 99.2°F | Ht 65.0 in | Wt 288.4 lb

## 2016-06-21 DIAGNOSIS — J111 Influenza due to unidentified influenza virus with other respiratory manifestations: Secondary | ICD-10-CM | POA: Diagnosis not present

## 2016-06-21 LAB — POCT INFLUENZA A/B
Influenza A, POC: POSITIVE — AB
Influenza B, POC: POSITIVE — AB

## 2016-06-21 MED ORDER — ALBUTEROL SULFATE HFA 108 (90 BASE) MCG/ACT IN AERS: 2.0000 | INHALATION_SPRAY | Freq: Four times a day (QID) | RESPIRATORY_TRACT | 1 refills | Status: DC | PRN

## 2016-06-21 MED ORDER — ALBUTEROL SULFATE (2.5 MG/3ML) 0.083% IN NEBU: 2.5000 mg | INHALATION_SOLUTION | Freq: Once | RESPIRATORY_TRACT | Status: DC

## 2016-06-21 MED ORDER — OSELTAMIVIR PHOSPHATE 75 MG PO CAPS: 75.0000 mg | ORAL_CAPSULE | Freq: Two times a day (BID) | ORAL | 0 refills | Status: AC

## 2016-06-21 MED ORDER — PROMETHAZINE-DM 6.25-15 MG/5ML PO SYRP: 5.0000 mL | ORAL_SOLUTION | Freq: Every evening | ORAL | 1 refills | Status: DC | PRN

## 2016-06-21 NOTE — Progress Notes (Signed)
Pre visit review using our clinic review tool, if applicable. No additional management support is needed unless otherwise documented below in the visit note. 

## 2016-06-21 NOTE — Patient Instructions (Signed)
Plenty of water. Start tamiflu.   Influenza, Adult Influenza, more commonly known as "the flu," is a viral infection that primarily affects the respiratory tract. The respiratory tract includes organs that help you breathe, such as the lungs, nose, and throat. The flu causes many common cold symptoms, as well as a high fever and body aches. The flu spreads easily from person to person (is contagious). Getting a flu shot (influenza vaccination) every year is the best way to prevent influenza. What are the causes? Influenza is caused by a virus. You can catch the virus by:  Breathing in droplets from an infected person's cough or sneeze.  Touching something that was recently contaminated with the virus and then touching your mouth, nose, or eyes. What increases the risk? The following factors may make you more likely to get the flu:  Not cleaning your hands frequently with soap and water or alcohol-based hand sanitizer.  Having close contact with many people during cold and flu season.  Touching your mouth, eyes, or nose without washing or sanitizing your hands first.  Not drinking enough fluids or not eating a healthy diet.  Not getting enough sleep or exercise.  Being under a high amount of stress.  Not getting a yearly (annual) flu shot. You may be at a higher risk of complications from the flu, such as a severe lung infection (pneumonia), if you:  Are over the age of 50.  Are pregnant.  Have a weakened disease-fighting system (immune system). You may have a weakened immune system if you:  Have HIV or AIDS.  Are undergoing chemotherapy.  Aretaking medicines that reduce the activity of (suppress) the immune system.  Have a long-term (chronic) illness, such as heart disease, kidney disease, diabetes, or lung disease.  Have a liver disorder.  Are obese.  Have anemia. What are the signs or symptoms? Symptoms of this condition typically last 4-10 days and may  include:  Fever.  Chills.  Headache, body aches, or muscle aches.  Sore throat.  Cough.  Runny or congested nose.  Chest discomfort and cough.  Poor appetite.  Weakness or tiredness (fatigue).  Dizziness.  Nausea or vomiting. How is this diagnosed? This condition may be diagnosed based on your medical history and a physical exam. Your health care provider may do a nose or throat swab test to confirm the diagnosis. How is this treated? If influenza is detected early, you can be treated with antiviral medicine that can reduce the length of your illness and the severity of your symptoms. This medicine may be given by mouth (orally) or through an IV tube that is inserted in one of your veins. The goal of treatment is to relieve symptoms by taking care of yourself at home. This may include taking over-the-counter medicines, drinking plenty of fluids, and adding humidity to the air in your home. In some cases, influenza goes away on its own. Severe influenza or complications from influenza may be treated in a hospital. Follow these instructions at home:  Take over-the-counter and prescription medicines only as told by your health care provider.  Use a cool mist humidifier to add humidity to the air in your home. This can make breathing easier.  Rest as needed.  Drink enough fluid to keep your urine clear or pale yellow.  Cover your mouth and nose when you cough or sneeze.  Wash your hands with soap and water often, especially after you cough or sneeze. If soap and water are not available,  use hand sanitizer.  Stay home from work or school as told by your health care provider. Unless you are visiting your health care provider, try to avoid leaving home until your fever has been gone for 24 hours without the use of medicine.  Keep all follow-up visits as told by your health care provider. This is important. How is this prevented?  Getting an annual flu shot is the best way to  avoid getting the flu. You may get the flu shot in late summer, fall, or winter. Ask your health care provider when you should get your flu shot.  Wash your hands often or use hand sanitizer often.  Avoid contact with people who are sick during cold and flu season.  Eat a healthy diet, drink plenty of fluids, get enough sleep, and exercise regularly. Contact a health care provider if:  You develop new symptoms.  You have:  Chest pain.  Diarrhea.  A fever.  Your cough gets worse.  You produce more mucus.  You feel nauseous or you vomit. Get help right away if:  You develop shortness of breath or difficulty breathing.  Your skin or nails turn a bluish color.  You have severe pain or stiffness in your neck.  You develop a sudden headache or sudden pain in your face or ear.  You cannot stop vomiting. This information is not intended to replace advice given to you by your health care provider. Make sure you discuss any questions you have with your health care provider. Document Released: 06/24/2000 Document Revised: 12/03/2015 Document Reviewed: 04/21/2015 Elsevier Interactive Patient Education  2017 [employer].

## 2016-06-21 NOTE — Progress Notes (Signed)
Subjective:    Patient ID: Amber Barnes, female    DOB: 1950-11-19, 65 y.o.   MRN: [id number]  CC: Amber Barnes is a 65 y.o. female who presents today for an acute visit.    HPI: CC: cough x one day. Endorses HA, wheezing, SOB, sore throat. Tmax 101 Feels like 'prior HA'. H/o of 'migraines'. Has tried plain mucinex with no relief.   Denies exertional chest pain or pressure, numbness or tingling radiating to left arm or jaw, palpitations, dizziness, frequent headaches, changes in vision.   H/o allergies  Former smoker    HISTORY:  Past Medical History:  Diagnosis Date  . Achilles tendinitis   . Acute medial meniscal tear   . Allergy   . Anxiety   . Arthritis   . Chronic lower back pain   . COPD (chronic obstructive pulmonary disease) ([place])   . Depression   . Fibrocystic breast disease   . GERD (gastroesophageal reflux disease)   . Hyperlipidemia   . Hypertension   . Insomnia   . Lipoma   . Osteoarthritis    left knee  . Osteopenia   . Tendonitis   . Thyroid disease   . Vitamin D deficiency    Past Surgical History:  Procedure Laterality Date  . ABDOMINAL HYSTERECTOMY     ovaries left  . ACHILLES TENDON SURGERY    . BACK SURGERY    . COLONOSCOPY    . ESOPHAGOGASTRODUODENOSCOPY    . THYROIDECTOMY     thyroid lobectomy secondary to a benign nodule  . TOTAL THYROIDECTOMY     Family History  Problem Relation Age of Onset  . Cancer Sister 22    ovarian  . Cancer Father 18    colon  . Heart disease Father   . Heart attack Father 80    died from MI  . Hyperlipidemia Father   . Hypertension Father   . Cancer Mother     leukemia  . Breast cancer Paternal Aunt     40's    Allergies: Prednisone; Adhesive [tape]; and Latex Current Outpatient Prescriptions on File Prior to Visit  Medication Sig Dispense Refill  . aspirin 81 MG tablet Take 81 mg by mouth daily.    . B Complex Vitamins (VITAMIN B COMPLEX PO) Take by mouth daily.    . bisoprolol  (ZEBETA) 10 MG tablet Take 10 mg by mouth 2 (two) times daily.     . busPIRone (BUSPAR) 10 MG tablet     . CALCIUM PO Take by mouth.    . Cholecalciferol (VITAMIN D PO) Take by mouth.    . cloNIDine (CATAPRES) 0.1 MG tablet Take 0.1 mg by mouth 2 (two) times daily.    . DULoxetine (CYMBALTA) 60 MG capsule Take 120 mg by mouth daily.    Marland Kitchen losartan-hydrochlorothiazide (HYZAAR) 100-25 MG per tablet Take 1 tablet by mouth daily.    . Multiple Vitamin (MULTIVITAMIN) tablet Take 1 tablet by mouth daily.    . NONFORMULARY OR COMPOUNDED ITEM [facility]:  Antiinflammatory Cream - Diclofenac 3%, Baclofen 2%, Cyclobenzaprine 2%, Lidocaine 2%, apply 1-2 grams to affected are 3-4 times daily. 120 each 6  . pantoprazole (PROTONIX) 40 MG tablet Take 1 tablet (40 mg total) by mouth daily. 30 tablet 0  . rosuvastatin (CRESTOR) 20 MG tablet Take 20 mg by mouth every evening.     . tizanidine (ZANAFLEX) 2 MG capsule Take 1 capsule (2 mg total) by mouth at bedtime as needed for  muscle spasms. 15 capsule 0  . [DISCONTINUED] omeprazole (PRILOSEC) 20 MG capsule Take 20 mg by mouth daily.     No current facility-administered medications on file prior to visit.     Social History  Substance Use Topics  . Smoking status: Former Smoker    Packs/day: 0.25    Years: 10.00    Types: Cigarettes  . Smokeless tobacco: Never Used     Comment: smoked for 10 years  . Alcohol use Yes     Comment: social, occasional    Review of Systems  Constitutional: Positive for chills and fever.  HENT: Positive for congestion, sinus pressure and sore throat.   Eyes: Negative for visual disturbance.  Respiratory: Positive for cough, shortness of breath and wheezing.   Cardiovascular: Negative for chest pain and palpitations.  Gastrointestinal: Negative for nausea and vomiting.  Neurological: Positive for headaches. Negative for dizziness.      Objective:    BP 136/80   Pulse 94   Temp 99.2 F (37.3 C) (Oral)   Ht  5\' 5"  (1.651 m)   Wt 288 lb 6.4 oz (130.8 kg)   SpO2 96%   BMI 47.99 kg/m    Physical Exam  Constitutional: She appears well-developed and well-nourished.  HENT:  Head: Normocephalic and atraumatic.  Right Ear: Hearing, tympanic membrane, external ear and ear canal normal. No drainage, swelling or tenderness. No foreign bodies. Tympanic membrane is not erythematous and not bulging. No middle ear effusion. No decreased hearing is noted.  Left Ear: Hearing, tympanic membrane, external ear and ear canal normal. No drainage, swelling or tenderness. No foreign bodies. Tympanic membrane is not erythematous and not bulging.  No middle ear effusion. No decreased hearing is noted.  Nose: Nose normal. No rhinorrhea. Right sinus exhibits no maxillary sinus tenderness and no frontal sinus tenderness. Left sinus exhibits no maxillary sinus tenderness and no frontal sinus tenderness.  Mouth/Throat: Uvula is midline, oropharynx is clear and moist and mucous membranes are normal. No oropharyngeal exudate, posterior oropharyngeal edema, posterior oropharyngeal erythema or tonsillar abscesses.  Eyes: Conjunctivae are normal.  Cardiovascular: Regular rhythm, normal heart sounds and normal pulses.   Pulmonary/Chest: Effort normal. She has decreased breath sounds in the right lower field and the left lower field. She has no wheezes. She has no rhonchi. She has no rales.  Lymphadenopathy:       Head (right side): No submental, no submandibular, no tonsillar, no preauricular, no posterior auricular and no occipital adenopathy present.       Head (left side): No submental, no submandibular, no tonsillar, no preauricular, no posterior auricular and no occipital adenopathy present.    She has no cervical adenopathy.  Neurological: She is alert.  Skin: Skin is warm and dry.  Psychiatric: She has a normal mood and affect. Her speech is normal and behavior is normal. Thought content normal.  Vitals reviewed.      Patient felt significantly better after albuterol treatment. Lung sounds clear and increased  Assessment & Plan:   1. Influenza Afebrile. Flu positive. Start tamiflu, prn cough medication.   - albuterol (PROVENTIL) (2.5 MG/3ML) 0.083% nebulizer solution 2.5 mg; Take 3 mLs (2.5 mg total) by nebulization once. - albuterol (PROVENTIL HFA) 108 (90 Base) MCG/ACT inhaler; Inhale 2 puffs into the lungs every 6 (six) hours as needed for wheezing or shortness of breath.  Dispense: 1 Inhaler; Refill: 1 - oseltamivir (TAMIFLU) 75 MG capsule; Take 1 capsule (75 mg total) by mouth 2 (two) times  daily.  Dispense: 10 capsule; Refill: 0 - promethazine-dextromethorphan (PROMETHAZINE-DM) 6.25-15 MG/5ML syrup; Take 5 mLs by mouth at bedtime as needed for cough.  Dispense: 40 mL; Refill: 1 - POCT Influenza A/B    I am having Ms. Mickelson start on albuterol, oseltamivir, and promethazine-dextromethorphan. I am also having her maintain her aspirin, CALCIUM PO, Cholecalciferol (VITAMIN D PO), NONFORMULARY OR COMPOUNDED ITEM, DULoxetine, multivitamin, bisoprolol, B Complex Vitamins (VITAMIN B COMPLEX PO), rosuvastatin, losartan-hydrochlorothiazide, cloNIDine, pantoprazole, busPIRone, and tizanidine. We will continue to administer albuterol.   Meds ordered this encounter  Medications  . albuterol (PROVENTIL) (2.5 MG/3ML) 0.083% nebulizer solution 2.5 mg  . albuterol (PROVENTIL HFA) 108 (90 Base) MCG/ACT inhaler    Sig: Inhale 2 puffs into the lungs every 6 (six) hours as needed for wheezing or shortness of breath.    Dispense:  1 Inhaler    Refill:  1    Order Specific Question:   Supervising Provider    Answer:   Derrel Nip, TERESA L [[id number]]  . oseltamivir (TAMIFLU) 75 MG capsule    Sig: Take 1 capsule (75 mg total) by mouth 2 (two) times daily.    Dispense:  10 capsule    Refill:  0    Order Specific Question:   Supervising Provider    Answer:   Deborra Medina L [[id number]]  . promethazine-dextromethorphan  (PROMETHAZINE-DM) 6.25-15 MG/5ML syrup    Sig: Take 5 mLs by mouth at bedtime as needed for cough.    Dispense:  40 mL    Refill:  1    Order Specific Question:   Supervising Provider    Answer:   Crecencio Mc [[id number]]    Return precautions given.   Risks, benefits, and alternatives of the medications and treatment plan prescribed today were discussed, and patient expressed understanding.   Education regarding symptom management and diagnosis given to patient on AVS.  Continue to follow with [name], FNP for routine health maintenance.   [name] and I agreed with plan.   [name], FNP

## 2016-06-23 ENCOUNTER — Ambulatory Visit: Payer: Medicare Other

## 2016-06-24 ENCOUNTER — Telehealth: Payer: Self-pay | Admitting: *Deleted

## 2016-06-24 DIAGNOSIS — J069 Acute upper respiratory infection, unspecified: Secondary | ICD-10-CM

## 2016-06-24 MED ORDER — BENZONATATE 100 MG PO CAPS: 100.0000 mg | ORAL_CAPSULE | Freq: Two times a day (BID) | ORAL | 0 refills | Status: DC | PRN

## 2016-06-24 NOTE — Telephone Encounter (Signed)
Patient was seen in the office on 12/12, she was diagnosed with the flu, she requested a all in reference to her energy level. Pt stated that she was really weak, and her energy level has not picked up. Pt contact [phone]

## 2016-06-24 NOTE — Telephone Encounter (Signed)
Spoke with patient states she is still weak explained to her it will take time for her to get better from flu illness  She states she can't take cough syrup promethazine DM it made her hallucinate and skin hurt when her husband touched her.   She is still using Albuterol inhaler wondering if this can bothering her from sleeping.  Please advise.

## 2016-06-24 NOTE — Telephone Encounter (Signed)
Patient stated her SX are better but is feeling weak and "Rundown".  But patient stated she understands the directions and will comply.

## 2016-06-24 NOTE — Telephone Encounter (Signed)
Please call patient

## 2016-06-24 NOTE — Telephone Encounter (Signed)
Call pt-  Viruses can decrease energy and last 7-10  If sxs have acutely worsened since I last her, she needs to go to an urgent care Otherwise stop cough syrup; I sent in new rx  She only needs to use the inhaler if she is wheezing or SOB. If not, she does not need to use as it may be contributing to poor sleep ( that and cough).

## 2016-06-29 ENCOUNTER — Other Ambulatory Visit: Payer: Self-pay | Admitting: Family

## 2016-06-29 DIAGNOSIS — R140 Abdominal distension (gaseous): Secondary | ICD-10-CM

## 2016-06-30 ENCOUNTER — Ambulatory Visit
Admission: RE | Admit: 2016-06-30 | Discharge: 2016-06-30 | Disposition: A | Discharge status: Home / Self Care | Payer: Medicare Other | Source: Ambulatory Visit | Attending: Family | Admitting: Family

## 2016-06-30 ENCOUNTER — Encounter: Payer: Self-pay | Admitting: Family

## 2016-06-30 DIAGNOSIS — I700 Atherosclerosis of aorta: Secondary | ICD-10-CM | POA: Insufficient documentation

## 2016-06-30 DIAGNOSIS — N281 Cyst of kidney, acquired: Secondary | ICD-10-CM | POA: Insufficient documentation

## 2016-06-30 DIAGNOSIS — K5730 Diverticulosis of large intestine without perforation or abscess without bleeding: Secondary | ICD-10-CM | POA: Diagnosis not present

## 2016-06-30 DIAGNOSIS — Z8041 Family history of malignant neoplasm of ovary: Secondary | ICD-10-CM | POA: Insufficient documentation

## 2016-06-30 DIAGNOSIS — K760 Fatty (change of) liver, not elsewhere classified: Secondary | ICD-10-CM | POA: Diagnosis not present

## 2016-06-30 DIAGNOSIS — K449 Diaphragmatic hernia without obstruction or gangrene: Secondary | ICD-10-CM | POA: Diagnosis not present

## 2016-06-30 DIAGNOSIS — Z90710 Acquired absence of both cervix and uterus: Secondary | ICD-10-CM | POA: Insufficient documentation

## 2016-06-30 LAB — POCT I-STAT CREATININE: Creatinine, Ser: 0.7 mg/dL (ref 0.44–1.00)

## 2016-06-30 MED ORDER — IOPAMIDOL (ISOVUE-370) INJECTION 76%
100.0000 mL | Freq: Once | INTRAVENOUS | Status: AC | PRN
  Administered 2016-06-30: 100 mL via INTRAVENOUS

## 2016-07-11 DIAGNOSIS — I4891 Unspecified atrial fibrillation: Secondary | ICD-10-CM

## 2016-07-11 HISTORY — DX: Unspecified atrial fibrillation: I48.91

## 2016-07-14 ENCOUNTER — Ambulatory Visit (INDEPENDENT_AMBULATORY_CARE_PROVIDER_SITE_OTHER): Payer: Medicare Other | Admitting: Podiatry

## 2016-07-14 ENCOUNTER — Ambulatory Visit (INDEPENDENT_AMBULATORY_CARE_PROVIDER_SITE_OTHER): Payer: Medicare Other

## 2016-07-14 VITALS — BP 139/88 | HR 92 | Resp 18

## 2016-07-14 DIAGNOSIS — M7660 Achilles tendinitis, unspecified leg: Secondary | ICD-10-CM | POA: Diagnosis not present

## 2016-07-14 DIAGNOSIS — M216X2 Other acquired deformities of left foot: Secondary | ICD-10-CM | POA: Diagnosis not present

## 2016-07-14 DIAGNOSIS — M779 Enthesopathy, unspecified: Secondary | ICD-10-CM | POA: Diagnosis not present

## 2016-07-14 MED ORDER — ENOXAPARIN SODIUM 40 MG/0.4ML ~~LOC~~ SOLN: 40.0000 mg | SUBCUTANEOUS | 0 refills | Status: DC

## 2016-07-14 NOTE — Patient Instructions (Signed)

## 2016-07-14 NOTE — Progress Notes (Signed)
Subjective: 66 year old female presents the office today for continued pain to the back of her left heel along prominent heel spurs as well as along the Achilles tendon. She states that the bones spurs have grown and she has a new one that she can feel she points the posterior medial heel. She states  "All I want to do is be able to walk". She has had to limit her activity due to the left heel pain and she is not able to walk without pain. She has continued with stretching, icing, shoe changes, anti-inflammatory creams or any significant improvement. She presents a discuss surgical intervention as well as possibly other treatment options.  She previously has had right Achilles tendon repair however she states she did not have a heel spur at that time.   Denies any systemic complaints such as fevers, chills, nausea, vomiting. No acute changes since last appointment, and no other complaints at this time.   Objective: AAO x3, NAD DP/PT pulses palpable bilaterally, CRT less than 3 seconds On the posterior aspect of the left heel is a prominent retrocalcaneal exostosis with prominence both medial and laterally. There is tenderness mostly on the posterior medial portion of the heel along the prominent heel spur. There is mild erythema from shoe gear irritation. There is trace edema to this area. Equinus is present. There is mild discomfort on the insertion of the Achilles tendon into the calcaneus but there is no pain along the mid substance of the Achilles tendon. There is no pain with lateral compression of the calcaneus. There is no other areas of pinpoint bony tenderness bilaterally. No open lesions or pre-ulcerative lesions.  No pain with calf compression, swelling, warmth, erythema  Assessment: Left retrocalcaneal exostosis/insertional Achilles tendinitis  Plan: -All treatment options discussed with the patient including all alternatives, risks, complications.  -Etiology of symptoms were  discussed -X-rays were obtained and reviewed with the patient. Prominent heel spur on both the inferior and posterior heel. No evidence of acute fracture.  -Again disused surgical intervention as well as conservative treatment. She would like to go ahead and proceed with surgery. She believes this has been getting worse and that injections and PT would only help for a short term, and I agree.  -Discussed with her gastroc recession as well ast heel spur removal from posterior heel and achilles tenolysis. She wishes to proceed.  -The incision placement as well as the postoperative course was discussed with the patient. Discussed 6 weeks of NWB and cast for about the first 3 weeks. Discussed PT after as well. She is aware that this could be a 6-12 month recovery. I discussed risks of the surgery which include, but not limited to, infection, bleeding, pain, swelling, need for further surgery, delayed or nonhealing, painful or ugly scar, numbness or sensation changes, over/under correction, recurrence, transfer lesions, further deformity, hardware failure, DVT/PE, loss of toe/foot. Patient understands these risks and wishes to proceed with surgery. The surgical consent was reviewed with the patient all 3 pages were signed. No promises or guarantees were given to the outcome of the procedure. All questions were answered to the best of my ability. Before the surgery the patient was encouraged to call the office if there is any further questions. The surgery will be performed at the [facility] on an outpatient basis.  Celesta Gentile, DPM

## 2016-07-18 ENCOUNTER — Telehealth: Payer: Self-pay | Admitting: *Deleted

## 2016-07-18 NOTE — Telephone Encounter (Signed)
"  My name is Juridia Nambo.  Thank you."

## 2016-07-19 NOTE — Telephone Encounter (Signed)
"  I was told to call you to set up my surgery for January 17."  Yes, I have you scheduled.  "Do you have a time?"  Someone from the surgical center will call you with the arrival time a day or two before.

## 2016-07-21 NOTE — Addendum Note (Signed)
Addended by: Cranford Mon R on: 07/21/2016 05:09 PM   Modules accepted: Orders

## 2016-07-27 ENCOUNTER — Encounter: Payer: Self-pay | Admitting: Emergency Medicine

## 2016-07-27 ENCOUNTER — Inpatient Hospital Stay (HOSPITAL_BASED_OUTPATIENT_CLINIC_OR_DEPARTMENT_OTHER)
Admit: 2016-07-27 | Discharge: 2016-07-27 | Disposition: A | Discharge status: Home / Self Care | Payer: Medicare Other | Attending: Specialist | Admitting: Specialist

## 2016-07-27 ENCOUNTER — Observation Stay
Admission: EM | Admit: 2016-07-27 | Discharge: 2016-07-29 | Disposition: A | Discharge status: Home / Self Care | Payer: Medicare Other | Attending: Specialist | Admitting: Specialist

## 2016-07-27 ENCOUNTER — Emergency Department: Payer: Medicare Other

## 2016-07-27 DIAGNOSIS — Z90710 Acquired absence of both cervix and uterus: Secondary | ICD-10-CM | POA: Insufficient documentation

## 2016-07-27 DIAGNOSIS — E559 Vitamin D deficiency, unspecified: Secondary | ICD-10-CM | POA: Insufficient documentation

## 2016-07-27 DIAGNOSIS — I10 Essential (primary) hypertension: Secondary | ICD-10-CM | POA: Diagnosis present

## 2016-07-27 DIAGNOSIS — M1712 Unilateral primary osteoarthritis, left knee: Secondary | ICD-10-CM | POA: Diagnosis not present

## 2016-07-27 DIAGNOSIS — F419 Anxiety disorder, unspecified: Secondary | ICD-10-CM | POA: Insufficient documentation

## 2016-07-27 DIAGNOSIS — Z91040 Latex allergy status: Secondary | ICD-10-CM | POA: Diagnosis not present

## 2016-07-27 DIAGNOSIS — E669 Obesity, unspecified: Secondary | ICD-10-CM | POA: Diagnosis present

## 2016-07-27 DIAGNOSIS — K219 Gastro-esophageal reflux disease without esophagitis: Secondary | ICD-10-CM | POA: Diagnosis not present

## 2016-07-27 DIAGNOSIS — E876 Hypokalemia: Secondary | ICD-10-CM | POA: Diagnosis present

## 2016-07-27 DIAGNOSIS — R079 Chest pain, unspecified: Secondary | ICD-10-CM

## 2016-07-27 DIAGNOSIS — I340 Nonrheumatic mitral (valve) insufficiency: Secondary | ICD-10-CM | POA: Insufficient documentation

## 2016-07-27 DIAGNOSIS — Z888 Allergy status to other drugs, medicaments and biological substances status: Secondary | ICD-10-CM | POA: Diagnosis not present

## 2016-07-27 DIAGNOSIS — Z7901 Long term (current) use of anticoagulants: Secondary | ICD-10-CM | POA: Insufficient documentation

## 2016-07-27 DIAGNOSIS — I481 Persistent atrial fibrillation: Principal | ICD-10-CM | POA: Insufficient documentation

## 2016-07-27 DIAGNOSIS — Z7982 Long term (current) use of aspirin: Secondary | ICD-10-CM | POA: Insufficient documentation

## 2016-07-27 DIAGNOSIS — J449 Chronic obstructive pulmonary disease, unspecified: Secondary | ICD-10-CM | POA: Insufficient documentation

## 2016-07-27 DIAGNOSIS — M8580 Other specified disorders of bone density and structure, unspecified site: Secondary | ICD-10-CM | POA: Diagnosis not present

## 2016-07-27 DIAGNOSIS — Z87891 Personal history of nicotine dependence: Secondary | ICD-10-CM | POA: Insufficient documentation

## 2016-07-27 DIAGNOSIS — M7752 Other enthesopathy of left foot: Secondary | ICD-10-CM | POA: Diagnosis not present

## 2016-07-27 DIAGNOSIS — I4891 Unspecified atrial fibrillation: Secondary | ICD-10-CM | POA: Diagnosis present

## 2016-07-27 DIAGNOSIS — E785 Hyperlipidemia, unspecified: Secondary | ICD-10-CM | POA: Diagnosis not present

## 2016-07-27 DIAGNOSIS — R0602 Shortness of breath: Secondary | ICD-10-CM | POA: Diagnosis present

## 2016-07-27 DIAGNOSIS — Z885 Allergy status to narcotic agent status: Secondary | ICD-10-CM | POA: Insufficient documentation

## 2016-07-27 DIAGNOSIS — R0789 Other chest pain: Secondary | ICD-10-CM

## 2016-07-27 DIAGNOSIS — M79672 Pain in left foot: Secondary | ICD-10-CM | POA: Diagnosis present

## 2016-07-27 DIAGNOSIS — F329 Major depressive disorder, single episode, unspecified: Secondary | ICD-10-CM | POA: Insufficient documentation

## 2016-07-27 DIAGNOSIS — Z6841 Body Mass Index (BMI) 40.0 and over, adult: Secondary | ICD-10-CM | POA: Diagnosis not present

## 2016-07-27 DIAGNOSIS — M7730 Calcaneal spur, unspecified foot: Secondary | ICD-10-CM | POA: Diagnosis present

## 2016-07-27 HISTORY — DX: Unspecified atrial fibrillation: I48.91

## 2016-07-27 LAB — BASIC METABOLIC PANEL
Anion gap: 8 (ref 5–15)
BUN: 18 mg/dL (ref 6–20)
CO2: 28 mmol/L (ref 22–32)
Calcium: 9.2 mg/dL (ref 8.9–10.3)
Chloride: 104 mmol/L (ref 101–111)
Creatinine, Ser: 0.85 mg/dL (ref 0.44–1.00)
GFR calc Af Amer: >60 mL/min (ref >60)
GFR calc non Af Amer: >60 mL/min (ref >60)
Glucose, Bld: 101 mg/dL — ABNORMAL HIGH (ref 65–99)
Potassium: 3.8 mmol/L (ref 3.5–5.1)
Sodium: 140 mmol/L (ref 135–145)

## 2016-07-27 LAB — TROPONIN I
Troponin I: <0.03 ng/mL (ref <0.03)
Troponin I: <0.03 ng/mL (ref <0.03)
Troponin I: <0.03 ng/mL (ref <0.03)
Troponin I: <0.03 ng/mL (ref <0.03)

## 2016-07-27 LAB — CBC
HCT: 41.6 % (ref 35.0–47.0)
Hemoglobin: 14.1 g/dL (ref 12.0–16.0)
MCH: 29.5 pg (ref 26.0–34.0)
MCHC: 33.8 g/dL (ref 32.0–36.0)
MCV: 87.2 fL (ref 80.0–100.0)
Platelets: 204 10*3/uL (ref 150–440)
RBC: 4.78 MIL/uL (ref 3.80–5.20)
RDW: 14.7 % — ABNORMAL HIGH (ref 11.5–14.5)
WBC: 8.9 10*3/uL (ref 3.6–11.0)

## 2016-07-27 MED ORDER — DULOXETINE HCL 60 MG PO CPEP
120.0000 mg | ORAL_CAPSULE | Freq: Every day | ORAL | Status: DC
  Administered 2016-07-28 – 2016-07-29 (×2): 120 mg via ORAL
  Filled 2016-07-27 (×2): qty 2

## 2016-07-27 MED ORDER — ALBUTEROL SULFATE (2.5 MG/3ML) 0.083% IN NEBU: 2.5000 mg | INHALATION_SOLUTION | Freq: Four times a day (QID) | RESPIRATORY_TRACT | Status: DC | PRN

## 2016-07-27 MED ORDER — ACETAMINOPHEN 325 MG PO TABS
650.0000 mg | ORAL_TABLET | Freq: Four times a day (QID) | ORAL | Status: DC | PRN
  Administered 2016-07-27: 650 mg via ORAL
  Filled 2016-07-27: qty 2

## 2016-07-27 MED ORDER — APIXABAN 5 MG PO TABS
5.0000 mg | ORAL_TABLET | Freq: Two times a day (BID) | ORAL | Status: DC
  Administered 2016-07-27: 5 mg via ORAL
  Filled 2016-07-27: qty 1

## 2016-07-27 MED ORDER — DILTIAZEM HCL 30 MG PO TABS
30.0000 mg | ORAL_TABLET | Freq: Four times a day (QID) | ORAL | Status: DC
  Administered 2016-07-27 – 2016-07-28 (×3): 30 mg via ORAL
  Filled 2016-07-27 (×3): qty 1

## 2016-07-27 MED ORDER — ONDANSETRON HCL 4 MG PO TABS: 4.0000 mg | ORAL_TABLET | Freq: Four times a day (QID) | ORAL | Status: DC | PRN

## 2016-07-27 MED ORDER — DILTIAZEM HCL 25 MG/5ML IV SOLN
10.0000 mg | Freq: Once | INTRAVENOUS | Status: AC
Start: 2016-07-27 — End: 2016-07-27
  Administered 2016-07-27: 10 mg via INTRAVENOUS

## 2016-07-27 MED ORDER — ONDANSETRON HCL 4 MG/2ML IJ SOLN: 4.0000 mg | Freq: Four times a day (QID) | INTRAMUSCULAR | Status: DC | PRN

## 2016-07-27 MED ORDER — SODIUM CHLORIDE 0.9% FLUSH
3.0000 mL | Freq: Two times a day (BID) | INTRAVENOUS | Status: DC
  Administered 2016-07-27 – 2016-07-28 (×4): 3 mL via INTRAVENOUS

## 2016-07-27 MED ORDER — DILTIAZEM HCL 25 MG/5ML IV SOLN
15.0000 mg | Freq: Once | INTRAVENOUS | Status: AC
  Administered 2016-07-27: 15 mg via INTRAVENOUS
  Filled 2016-07-27: qty 5

## 2016-07-27 MED ORDER — DILTIAZEM HCL 25 MG/5ML IV SOLN
10.0000 mg | Freq: Four times a day (QID) | INTRAVENOUS | Status: DC | PRN
  Filled 2016-07-27: qty 5

## 2016-07-27 MED ORDER — ACETAMINOPHEN 650 MG RE SUPP: 650.0000 mg | Freq: Four times a day (QID) | RECTAL | Status: DC | PRN

## 2016-07-27 MED ORDER — LOSARTAN POTASSIUM-HCTZ 100-25 MG PO TABS: 1.0000 | ORAL_TABLET | Freq: Every day | ORAL | Status: DC

## 2016-07-27 MED ORDER — BUSPIRONE HCL 5 MG PO TABS
10.0000 mg | ORAL_TABLET | Freq: Two times a day (BID) | ORAL | Status: DC
  Administered 2016-07-27 – 2016-07-29 (×4): 10 mg via ORAL
  Filled 2016-07-27 (×4): qty 2

## 2016-07-27 MED ORDER — DILTIAZEM HCL ER COATED BEADS 120 MG PO CP24
120.0000 mg | ORAL_CAPSULE | Freq: Once | ORAL | Status: AC
  Administered 2016-07-27: 120 mg via ORAL
  Filled 2016-07-27: qty 1

## 2016-07-27 MED ORDER — BENZONATATE 100 MG PO CAPS
100.0000 mg | ORAL_CAPSULE | Freq: Two times a day (BID) | ORAL | Status: DC | PRN
  Administered 2016-07-29: 100 mg via ORAL
  Filled 2016-07-27: qty 1

## 2016-07-27 MED ORDER — LOSARTAN POTASSIUM 50 MG PO TABS
100.0000 mg | ORAL_TABLET | Freq: Every day | ORAL | Status: DC
  Administered 2016-07-27: 100 mg via ORAL
  Filled 2016-07-27 (×2): qty 2

## 2016-07-27 MED ORDER — BISOPROLOL FUMARATE 5 MG PO TABS
10.0000 mg | ORAL_TABLET | Freq: Two times a day (BID) | ORAL | Status: DC
  Administered 2016-07-27 – 2016-07-29 (×4): 10 mg via ORAL
  Filled 2016-07-27 (×4): qty 2

## 2016-07-27 MED ORDER — CLONIDINE HCL 0.1 MG PO TABS
0.1000 mg | ORAL_TABLET | Freq: Every day | ORAL | Status: DC
  Filled 2016-07-27: qty 1

## 2016-07-27 MED ORDER — CALCIUM CARBONATE ANTACID 500 MG PO CHEW
500.0000 mg | CHEWABLE_TABLET | Freq: Every day | ORAL | Status: DC
  Administered 2016-07-28 – 2016-07-29 (×2): 500 mg via ORAL
  Filled 2016-07-27 (×3): qty 1

## 2016-07-27 MED ORDER — ROSUVASTATIN CALCIUM 20 MG PO TABS
20.0000 mg | ORAL_TABLET | Freq: Every evening | ORAL | Status: DC
  Administered 2016-07-27 – 2016-07-28 (×2): 20 mg via ORAL
  Filled 2016-07-27 (×2): qty 1

## 2016-07-27 MED ORDER — ADULT MULTIVITAMIN W/MINERALS CH
1.0000 | ORAL_TABLET | Freq: Every day | ORAL | Status: DC
  Administered 2016-07-28 – 2016-07-29 (×2): 1 via ORAL
  Filled 2016-07-27 (×2): qty 1

## 2016-07-27 MED ORDER — HYDROCHLOROTHIAZIDE 25 MG PO TABS
25.0000 mg | ORAL_TABLET | Freq: Every day | ORAL | Status: DC
  Administered 2016-07-27: 25 mg via ORAL
  Filled 2016-07-27 (×2): qty 1

## 2016-07-27 MED ORDER — CHOLECALCIFEROL 10 MCG (400 UNIT) PO TABS
400.0000 [IU] | ORAL_TABLET | Freq: Every day | ORAL | Status: DC
Start: 2016-07-28 — End: 2016-07-29
  Administered 2016-07-28 – 2016-07-29 (×2): 400 [IU] via ORAL
  Filled 2016-07-27 (×2): qty 1

## 2016-07-27 MED ORDER — APIXABAN 5 MG PO TABS
5.0000 mg | ORAL_TABLET | Freq: Two times a day (BID) | ORAL | Status: DC
  Administered 2016-07-27 – 2016-07-29 (×5): 5 mg via ORAL
  Filled 2016-07-27 (×5): qty 1

## 2016-07-27 NOTE — ED Provider Notes (Signed)
[facility] Emergency Department Provider Note ____________________________________________   I have reviewed the triage vital signs and the triage nursing note.  HISTORY  Chief Complaint Chest Pain   Historian Patient and spouse  HPI Amber Barnes is a 66 y.o. female with obesity, follows with Dr. call in cardiology for "preventative "reasons, but no known coronary artery disease, or CHF, actually presented to outpatient surgery in [facility] for bone spur of her foot this morning and when anesthesia came to see her did an EKG showing atrial fibrillation with rapid ventricular response and was told to follow up with her cardiologist. Because she was in [place] she drove back to [place] and because the cardiology's office was closed due to weather, patient came to the ER.  She states that she had flu a couple weeks ago and since then she's had some intermittent episodes with shortness of breath and chest pressure, but she thought that those were due to flu and it actually improved since then. She's never had an irregular heartbeat. She is currently not experiencing chest pressure or dyspnea.  She does as a routine take aspirin 81 mg, however she's been off of it for a week in preparation for elective surgery on her foot.    Past Medical History:  Diagnosis Date  . Achilles tendinitis   . Acute medial meniscal tear   . Allergy   . Anxiety   . Arthritis   . Chronic lower back pain   . COPD (chronic obstructive pulmonary disease) ([place])   . Depression   . Fibrocystic breast disease   . GERD (gastroesophageal reflux disease)   . Hyperlipidemia   . Hypertension   . Insomnia   . Lipoma   . Osteoarthritis    left knee  . Osteopenia   . Tendonitis   . Thyroid disease   . Vitamin D deficiency     Patient Active Problem List   Diagnosis Date Noted  . Fatty liver 06/30/2016  . Dizziness 06/08/2016  . Family history of ovarian cancer 06/08/2016   . GERD (gastroesophageal reflux disease) 05/05/2016  . Seasonal rhinitis 05/05/2016  . Routine physical examination 05/05/2016  . Heel spur 11/05/2015  . Tendonitis 11/05/2015  . SOB (shortness of breath) 11/11/2014  . Morbid obesity ([place]) 11/11/2014  . Hyperlipidemia 11/11/2014  . Essential hypertension 11/11/2014  . Anxiety and depression 11/11/2014  . Arthritis, senescent 11/11/2014  . Weight loss due to medication 11/11/2014  . Obesity, Class III, BMI 40-49.9 (morbid obesity) ([place]) 04/11/2013    Past Surgical History:  Procedure Laterality Date  . ABDOMINAL HYSTERECTOMY     ovaries left  . ACHILLES TENDON SURGERY    . BACK SURGERY    . COLONOSCOPY    . ESOPHAGOGASTRODUODENOSCOPY    . THYROIDECTOMY     thyroid lobectomy secondary to a benign nodule  . TOTAL THYROIDECTOMY      Prior to Admission medications   Medication Sig Start Date End Date Taking? Authorizing Provider  aspirin 81 MG tablet Take 81 mg by mouth daily.   Yes Historical Provider, MD  B Complex Vitamins (VITAMIN B COMPLEX PO) Take by mouth daily.   Yes Historical Provider, MD  bisoprolol (ZEBETA) 10 MG tablet Take 10 mg by mouth 2 (two) times daily.    Yes Historical Provider, MD  busPIRone (BUSPAR) 10 MG tablet Take 10 mg by mouth 2 (two) times daily.  04/29/16  Yes Historical Provider, MD  CALCIUM PO Take 500 mg by mouth daily.  Yes Historical Provider, MD  Cholecalciferol (VITAMIN D PO) Take 5,000 mg by mouth daily.    Yes Historical Provider, MD  cloNIDine (CATAPRES) 0.1 MG tablet Take 0.1 mg by mouth daily. 05/16/16  Yes Historical Provider, MD  DULoxetine (CYMBALTA) 60 MG capsule Take 120 mg by mouth daily.   Yes Historical Provider, MD  losartan-hydrochlorothiazide (HYZAAR) 100-25 MG per tablet Take 1 tablet by mouth daily.   Yes Historical Provider, MD  Multiple Vitamin (MULTIVITAMIN) tablet Take 1 tablet by mouth daily.   Yes Historical Provider, MD  rosuvastatin (CRESTOR) 20 MG tablet Take 20 mg  by mouth every evening.    Yes Historical Provider, MD  albuterol (PROVENTIL HFA) 108 (90 Base) MCG/ACT inhaler Inhale 2 puffs into the lungs every 6 (six) hours as needed for wheezing or shortness of breath. 06/21/16   [name], FNP  benzonatate (TESSALON) 100 MG capsule Take 1 capsule (100 mg total) by mouth 2 (two) times daily as needed for cough. 06/24/16   [name], FNP  enoxaparin (LOVENOX) 40 MG/0.4ML injection Inject 0.4 mLs (40 mg total) into the skin daily. 07/14/16   Trula Slade, DPM  NONFORMULARY OR COMPOUNDED ITEM [facility]:  Antiinflammatory Cream - Diclofenac 3%, Baclofen 2%, Cyclobenzaprine 2%, Lidocaine 2%, apply 1-2 grams to affected are 3-4 times daily. Patient not taking: Reported on 07/27/2016 12/28/15   Trula Slade, DPM  pantoprazole (PROTONIX) 40 MG tablet Take 1 tablet (40 mg total) by mouth daily. Patient not taking: Reported on 07/27/2016 0000000   Delora Fuel, MD  tizanidine (ZANAFLEX) 2 MG capsule Take 1 capsule (2 mg total) by mouth at bedtime as needed for muscle spasms. Patient not taking: Reported on 07/27/2016 05/17/16   [name], FNP    Allergies  Allergen Reactions  . Prednisone Other (See Comments)    Agitation, hyperactivity, polyphagia  . Hydrocodone-Acetaminophen Other (See Comments)    Hallucinations and combative  . Adhesive [Tape] Itching and Rash  . Latex Itching and Rash    use paper tape    Family History  Problem Relation Age of Onset  . Cancer Sister 36    ovarian  . Cancer Father 28    colon  . Heart disease Father   . Heart attack Father 79    died from MI  . Hyperlipidemia Father   . Hypertension Father   . Cancer Mother     leukemia  . Breast cancer Paternal Aunt     19's    Social History Social History  Substance Use Topics  . Smoking status: Former Smoker    Packs/day: 0.25    Years: 10.00    Types: Cigarettes  . Smokeless tobacco: Never Used     Comment: smoked for 10 years   . Alcohol use Yes     Comment: social, occasional    Review of Systems  Constitutional: Recently got over "the flu." Eyes: Negative for visual changes. ENT: Negative for sore throat. Cardiovascular: Chest pressure over the past few weeks, but none today as per history of present illness.Marland Kitchen Respiratory: Dyspnea while she had her upper respiratory symptoms and flu, over the past few weeks, but none today. Gastrointestinal: Negative for abdominal pain, vomiting and diarrhea. Genitourinary: Negative for dysuria. Musculoskeletal: Negative for back pain. Skin: Negative for rash. Neurological: Negative for headache. 10 point Review of Systems otherwise negative ____________________________________________   PHYSICAL EXAM:  VITAL SIGNS: ED Triage Vitals  Enc Vitals Group  BP 07/27/16 1014 135/86     Pulse Rate 07/27/16 1014 (!) 57     Resp 07/27/16 1014 20     Temp 07/27/16 1014 98 F (36.7 C)     Temp Source 07/27/16 1014 Oral     SpO2 07/27/16 1014 95 %     Weight 07/27/16 1014 280 lb (127 kg)     Height 07/27/16 1014 5\' 5"  (1.651 m)     Head Circumference --      Peak Flow --      Pain Score 07/27/16 1015 5     Pain Loc --      Pain Edu? --      Excl. in [place]? --      Constitutional: Alert and oriented. Well appearing and in no distress. HEENT   Head: Normocephalic and atraumatic.      Eyes: Conjunctivae are normal. PERRL. Normal extraocular movements.      Ears:         Nose: No congestion/rhinnorhea.   Mouth/Throat: Mucous membranes are moist.   Neck: No stridor. Cardiovascular/Chest: Irregularly irregular and tachycardic.  No murmurs, rubs, or gallops. Respiratory: Normal respiratory effort without tachypnea nor retractions. Breath sounds are clear and equal bilaterally. No wheezes/rales/rhonchi. Gastrointestinal: Soft. No distention, no guarding, no rebound. Nontender.    Genitourinary/rectal:Deferred Musculoskeletal: Nontender with normal range of  motion in all extremities. No joint effusions.  No lower extremity tenderness.  No edema. Neurologic:  Normal speech and language. No gross or focal neurologic deficits are appreciated. Skin:  Skin is warm, dry and intact. No rash noted. Psychiatric: Mood and affect are normal. Speech and behavior are normal. Patient exhibits appropriate insight and judgment.   ____________________________________________  LABS (pertinent positives/negatives)  Labs Reviewed  BASIC METABOLIC PANEL - Abnormal; Notable for the following:       Result Value   Glucose, Bld 101 (*)    All other components within normal limits  CBC - Abnormal; Notable for the following:    RDW 14.7 (*)    All other components within normal limits  TROPONIN I    ____________________________________________    EKG I, Lisa Roca, MD, the attending physician have personally viewed and interpreted all ECGs.  148 bpm. Initial fibrillation with rapid ventricular response. Narrow QRS. Normal axis. Nonspecific T wave. ____________________________________________  RADIOLOGY All Xrays were viewed by me. Imaging interpreted by Radiologist.  Chest x-ray two-view:  IMPRESSION: Borderline cardiomegaly.  Aortic atherosclerosis.  __________________________________________  PROCEDURES  Procedure(s) performed: None  Critical Care performed: None  ____________________________________________   ED COURSE / ASSESSMENT AND PLAN  Pertinent labs & imaging results that were available during my care of the patient were reviewed by me and considered in my medical decision making (see chart for details).    Amber Barnes presents with new onset atrial fibrillation with rapid ventricular response. Currently symptomatic, but states that she has been dyspneic with exertion, and had intermittent chest pressure which is probably from A. fib which she had not recognized.  Symptoms certainly sound like they may have been ongoing for  more than 48 hours at least this point in time.  Laboratory studies are reassuring.  I spoke with Dr. [name], her cardiologist, after heart rate down from 140s to 115 after 15 mg bolus of diltiazem IV, and he did recommend patient could potentially be discharged with 120 diltiazem by mouth extended release and Eloquis 5 mg by mouth twice a day, and he'll be happy to titrate over the  phone and see her in the office, on the other hand, if patient is having symptoms, worsened cough with this plan, could be hospitalized for management of the new onset A. fib with RVR.  Patient is a little uncomfortable going home given the winter weather, poor roads, potential for delayed ambulance response time if she should get worse. I think this is reasonable in this case that we do this and patient. As she was talking her heart rate went up into the 130s. I did go ahead and give her by mouth dose of Eloquis and extended release diltiazem and speak with hospitalist for admission.    CONSULTATIONS:   Dr. [name], cardiology recommends inpatient vs. Outpatient depending on patient comfort level.  Hospitalist for admission.   Patient / Family / Caregiver informed of clinical course, medical decision-making process, and agree with plan.    ___________________________________________   FINAL CLINICAL IMPRESSION(S) / ED DIAGNOSES   Final diagnoses:  Atrial fibrillation with rapid ventricular response ([place])  New onset atrial fibrillation [facility])              Note: This dictation was prepared with Dragon dictation. Any transcriptional errors that result from this process are unintentional    Lisa Roca, MD 07/27/16 1234

## 2016-07-27 NOTE — Progress Notes (Signed)
ANTICOAGULATION CONSULT NOTE - Initial Consult  Pharmacy Consult for Apixaban  Indication: atrial fibrillation  Allergies  Allergen Reactions  . Prednisone Other (See Comments)    Agitation, hyperactivity, polyphagia  . Hydrocodone-Acetaminophen Other (See Comments)    Hallucinations and combative  . Adhesive [Tape] Itching and Rash  . Latex Itching and Rash    use paper tape    Patient Measurements: Height: 5\' 5"  (165.1 cm) Weight: 284 lb (128.8 kg) IBW/kg (Calculated) : 57 Heparin Dosing Weight:   Vital Signs: Temp: 97.7 F (36.5 C) (01/17 1449) Temp Source: Oral (01/17 1449) BP: 138/84 (01/17 1449) Pulse Rate: 47 (01/17 1449)  Labs:  Recent Labs  07/27/16 1038 07/27/16 1347  HGB 14.1  --   HCT 41.6  --   PLT 204  --   CREATININE 0.85  --   TROPONINI <0.03 <0.03    Estimated Creatinine Clearance: 91.7 mL/min (by C-G formula based on SCr of 0.85 mg/dL).   Medical History: Past Medical History:  Diagnosis Date  . Achilles tendinitis   . Acute medial meniscal tear   . Allergy   . Anxiety   . Arthritis   . Chronic lower back pain   . COPD (chronic obstructive pulmonary disease) ([place])   . Depression   . Fibrocystic breast disease   . GERD (gastroesophageal reflux disease)   . Hyperlipidemia   . Hypertension   . Insomnia   . Lipoma   . Osteoarthritis    left knee  . Osteopenia   . Tendonitis   . Thyroid disease   . Vitamin D deficiency     Medications:  Facility-Administered Medications Prior to Admission  Medication Dose Route Frequency Provider Last Rate Last Dose  . albuterol (PROVENTIL) (2.5 MG/3ML) 0.083% nebulizer solution 2.5 mg  2.5 mg Nebulization Once [name], FNP       Prescriptions Prior to Admission  Medication Sig Dispense Refill Last Dose  . aspirin 81 MG tablet Take 81 mg by mouth daily.   Past Week at 0800  . B Complex Vitamins (VITAMIN B COMPLEX PO) Take by mouth daily.   07/27/2016 at 0500  . bisoprolol (ZEBETA) 10  MG tablet Take 10 mg by mouth 2 (two) times daily.    07/27/2016 at 0500  . busPIRone (BUSPAR) 10 MG tablet Take 10 mg by mouth 2 (two) times daily.    07/27/2016 at 0500  . CALCIUM PO Take 500 mg by mouth daily.    07/27/2016 at 0500  . Cholecalciferol (VITAMIN D PO) Take 5,000 mg by mouth daily.    07/27/2016 at 0500  . cloNIDine (CATAPRES) 0.1 MG tablet Take 0.1 mg by mouth daily.   07/27/2016 at 0500  . DULoxetine (CYMBALTA) 60 MG capsule Take 120 mg by mouth daily.   07/27/2016 at 0500  . losartan-hydrochlorothiazide (HYZAAR) 100-25 MG per tablet Take 1 tablet by mouth daily.   07/27/2016 at 0500  . Multiple Vitamin (MULTIVITAMIN) tablet Take 1 tablet by mouth daily.   07/27/2016 at 0500  . rosuvastatin (CRESTOR) 20 MG tablet Take 20 mg by mouth every evening.    07/26/2016 at 1800  . albuterol (PROVENTIL HFA) 108 (90 Base) MCG/ACT inhaler Inhale 2 puffs into the lungs every 6 (six) hours as needed for wheezing or shortness of breath. 1 Inhaler 1 prn at prn  . benzonatate (TESSALON) 100 MG capsule Take 1 capsule (100 mg total) by mouth 2 (two) times daily as needed for cough. 20 capsule 0 prn  at prn  . enoxaparin (LOVENOX) 40 MG/0.4ML injection Inject 0.4 mLs (40 mg total) into the skin daily. 14 Syringe 0   . NONFORMULARY OR COMPOUNDED ITEM [facility]:  Antiinflammatory Cream - Diclofenac 3%, Baclofen 2%, Cyclobenzaprine 2%, Lidocaine 2%, apply 1-2 grams to affected are 3-4 times daily. (Patient not taking: Reported on 07/27/2016) 120 each 6 Not Taking at Unknown time  . pantoprazole (PROTONIX) 40 MG tablet Take 1 tablet (40 mg total) by mouth daily. (Patient not taking: Reported on 07/27/2016) 30 tablet 0 Not Taking at Unknown time  . tizanidine (ZANAFLEX) 2 MG capsule Take 1 capsule (2 mg total) by mouth at bedtime as needed for muscle spasms. (Patient not taking: Reported on 07/27/2016) 15 capsule 0 Not Taking at Unknown time    Assessment: Pharmacy consulted to dose Eliquis in this 66 year old  female with non valvular Afib. TBW = 128.8 kg CrCl = 91.7 ml/min  Goal of Therapy:  prevention of thromboembolism   Plan:  Eliquis 5 mg PO X 1 given on 1/17 @ 14:00. Eliquis 5 mg PO BID ordered to start on 1/17 @ 22:00.   Kahdijah Errickson D 07/27/2016,3:38 PM

## 2016-07-27 NOTE — H&P (Signed)
[facility] [place] NAME: Amber Barnes    MR#:  [id number]  DATE OF BIRTH:  Apr 14, 1951  DATE OF ADMISSION:  07/27/2016  PRIMARY CARE PHYSICIAN: [name], FNP   REQUESTING/REFERRING PHYSICIAN: Dr. Lisa Roca  CHIEF COMPLAINT:   Chief Complaint  Patient presents with  . Chest Pain    HISTORY OF PRESENT ILLNESS:  Amber Barnes  is a 66 y.o. female with a known history of Hypertension, hyperlipidemia, osteoarthritis, COPD, depression, chronic low back pain who presents to the hospital due to new onset atrial fibrillation. Patient was supposed to get a surgery on her left foot secondary to a bone spur but preoperatively was noted to be in atrial fibrillation and rapid ventricular response and sent to the ER. Patient received 1 dose of IV Cardizem with some minimal improvement in her heart rates. Patient says that for the past few days to a week she has been having some left arm pain and also having shortness of breath on exertion. She denies any dizziness, palpitations, chest pain or pressure.  Patient does say that she had a GI illness a few days back which has not resolved. She also had the flu about a few weeks back but her respiratory symptoms have improved now. Given her new onset atrial fibrillation with rapid ventricular response hospitalist services were contacted further treatment and evaluation.  PAST MEDICAL HISTORY:   Past Medical History:  Diagnosis Date  . Achilles tendinitis   . Acute medial meniscal tear   . Allergy   . Anxiety   . Arthritis   . Chronic lower back pain   . COPD (chronic obstructive pulmonary disease) ([place])   . Depression   . Fibrocystic breast disease   . GERD (gastroesophageal reflux disease)   . Hyperlipidemia   . Hypertension   . Insomnia   . Lipoma   . Osteoarthritis    left knee  . Osteopenia   . Tendonitis   . Thyroid disease   . Vitamin D deficiency     PAST SURGICAL HISTORY:    Past Surgical History:  Procedure Laterality Date  . ABDOMINAL HYSTERECTOMY     ovaries left  . ACHILLES TENDON SURGERY    . BACK SURGERY    . COLONOSCOPY    . ESOPHAGOGASTRODUODENOSCOPY    . THYROIDECTOMY     thyroid lobectomy secondary to a benign nodule  . TOTAL THYROIDECTOMY      SOCIAL HISTORY:   Social History  Substance Use Topics  . Smoking status: Former Smoker    Packs/day: 0.25    Years: 10.00    Types: Cigarettes  . Smokeless tobacco: Never Used     Comment: smoked for 10 years  . Alcohol use Yes     Comment: social, occasional    FAMILY HISTORY:   Family History  Problem Relation Age of Onset  . Cancer Sister 10    ovarian  . Cancer Father 73    colon  . Heart disease Father   . Heart attack Father 32    died from MI  . Hyperlipidemia Father   . Hypertension Father   . Cancer Mother     leukemia  . Breast cancer Paternal Aunt     58's    DRUG ALLERGIES:   Allergies  Allergen Reactions  . Prednisone Other (See Comments)    Agitation, hyperactivity, polyphagia  . Hydrocodone-Acetaminophen Other (See Comments)    Hallucinations and combative  .  Adhesive [Tape] Itching and Rash  . Latex Itching and Rash    use paper tape    REVIEW OF SYSTEMS:   Review of Systems  Constitutional: Negative for fever and weight loss.  HENT: Negative for congestion, nosebleeds and tinnitus.   Eyes: Negative for blurred vision, double vision and redness.  Respiratory: Positive for shortness of breath. Negative for cough and hemoptysis.   Cardiovascular: Negative for chest pain, orthopnea, leg swelling and PND.  Gastrointestinal: Negative for abdominal pain, diarrhea, melena, nausea and vomiting.  Genitourinary: Negative for dysuria, hematuria and urgency.  Musculoskeletal: Negative for falls and joint pain.  Neurological: Negative for dizziness, tingling, sensory change, focal weakness, seizures, weakness and headaches.  Endo/Heme/Allergies: Negative for  polydipsia. Does not bruise/bleed easily.  Psychiatric/Behavioral: Negative for depression and memory loss. The patient is not nervous/anxious.     MEDICATIONS AT HOME:   Prior to Admission medications   Medication Sig Start Date End Date Taking? Authorizing Provider  aspirin 81 MG tablet Take 81 mg by mouth daily.   Yes Historical Provider, MD  B Complex Vitamins (VITAMIN B COMPLEX PO) Take by mouth daily.   Yes Historical Provider, MD  bisoprolol (ZEBETA) 10 MG tablet Take 10 mg by mouth 2 (two) times daily.    Yes Historical Provider, MD  busPIRone (BUSPAR) 10 MG tablet Take 10 mg by mouth 2 (two) times daily.  04/29/16  Yes Historical Provider, MD  CALCIUM PO Take 500 mg by mouth daily.    Yes Historical Provider, MD  Cholecalciferol (VITAMIN D PO) Take 5,000 mg by mouth daily.    Yes Historical Provider, MD  cloNIDine (CATAPRES) 0.1 MG tablet Take 0.1 mg by mouth daily. 05/16/16  Yes Historical Provider, MD  DULoxetine (CYMBALTA) 60 MG capsule Take 120 mg by mouth daily.   Yes Historical Provider, MD  losartan-hydrochlorothiazide (HYZAAR) 100-25 MG per tablet Take 1 tablet by mouth daily.   Yes Historical Provider, MD  Multiple Vitamin (MULTIVITAMIN) tablet Take 1 tablet by mouth daily.   Yes Historical Provider, MD  rosuvastatin (CRESTOR) 20 MG tablet Take 20 mg by mouth every evening.    Yes Historical Provider, MD  albuterol (PROVENTIL HFA) 108 (90 Base) MCG/ACT inhaler Inhale 2 puffs into the lungs every 6 (six) hours as needed for wheezing or shortness of breath. 06/21/16   [name], FNP  benzonatate (TESSALON) 100 MG capsule Take 1 capsule (100 mg total) by mouth 2 (two) times daily as needed for cough. 06/24/16   [name], FNP  enoxaparin (LOVENOX) 40 MG/0.4ML injection Inject 0.4 mLs (40 mg total) into the skin daily. 07/14/16   Trula Slade, DPM  NONFORMULARY OR COMPOUNDED ITEM [facility]:  Antiinflammatory Cream - Diclofenac 3%, Baclofen 2%,  Cyclobenzaprine 2%, Lidocaine 2%, apply 1-2 grams to affected are 3-4 times daily. Patient not taking: Reported on 07/27/2016 12/28/15   Trula Slade, DPM  pantoprazole (PROTONIX) 40 MG tablet Take 1 tablet (40 mg total) by mouth daily. Patient not taking: Reported on 07/27/2016 0000000   Delora Fuel, MD  tizanidine (ZANAFLEX) 2 MG capsule Take 1 capsule (2 mg total) by mouth at bedtime as needed for muscle spasms. Patient not taking: Reported on 07/27/2016 05/17/16   [name], FNP      VITAL SIGNS:  Blood pressure 135/86, pulse (!) 57, temperature 98 F (36.7 C), temperature source Oral, resp. rate 20, height 5\' 5"  (1.651 m), weight 127 kg (280 lb), SpO2 95 %.  PHYSICAL EXAMINATION:  Physical Exam  GENERAL:  66 y.o.-year-old obese patient lying in the bed in no acute distress.  EYES: Pupils equal, round, reactive to light and accommodation. No scleral icterus. Extraocular muscles intact.  HEENT: Head atraumatic, normocephalic. Oropharynx and nasopharynx clear. No oropharyngeal erythema, moist oral mucosa  NECK:  Supple, no jugular venous distention. No thyroid enlargement, no tenderness.  LUNGS: Normal breath sounds bilaterally, no wheezing, rales, rhonchi. No use of accessory muscles of respiration.  CARDIOVASCULAR: S1, S2 Irregular. No murmurs, rubs, gallops, clicks.  ABDOMEN: Soft, nontender, nondistended. Bowel sounds present. No organomegaly or mass.  EXTREMITIES: No pedal edema, cyanosis, or clubbing. + 2 pedal & radial pulses b/l.   NEUROLOGIC: Cranial nerves II through XII are intact. No focal Motor or sensory deficits appreciated b/l PSYCHIATRIC: The patient is alert and oriented x 3. Good affect.  SKIN: No obvious rash, lesion, or ulcer.   LABORATORY PANEL:   CBC  Recent Labs Lab 07/27/16 1038  WBC 8.9  HGB 14.1  HCT 41.6  PLT 204    ------------------------------------------------------------------------------------------------------------------  Chemistries   Recent Labs Lab 07/27/16 1038  NA 140  K 3.8  CL 104  CO2 28  GLUCOSE 101*  BUN 18  CREATININE 0.85  CALCIUM 9.2   ------------------------------------------------------------------------------------------------------------------  Cardiac Enzymes  Recent Labs Lab 07/27/16 1038  TROPONINI <0.03   ------------------------------------------------------------------------------------------------------------------  RADIOLOGY:  Dg Chest 2 View  Result Date: 07/27/2016 CLINICAL DATA:  Chest pain.  Atrial fibrillation. EXAM: CHEST  2 VIEW COMPARISON:  None. FINDINGS: Borderline cardiomegaly. Pulmonary vascularity is normal and the lungs are clear. No effusions. Slight calcification in the thoracic aorta. No bone abnormality. IMPRESSION: Borderline cardiomegaly. Aortic atherosclerosis. Electronically Signed   By: Lorriane Shire M.D.   On: 07/27/2016 11:02     IMPRESSION AND PLAN:   66 year old female with past medical history of hypertension, hyperlipidemia, GERD, depression, COPD, anxiety, osteoarthritis who presents to the hospital due to shortness of breath and noted to be in new onset atrial fibrillation.  1. Atrial fibrillation with rapid ventricular response-patient's heart rates were in the 130s to 140s in the ER. Patient received 1 dose of IV Cardizem with minimal improvement in her heart rate. -We'll place patient on some oral Cardizem and also IV Cardizem boluses as needed. -We'll get a cardiology consult, check two-dimensional echocardiogram, cycle cardiac markers. - pt's CHADs Vasc score is 3.  Will start on Eliquis  2. Essential hypertension-continue clonidine, losartan/HCTZ, bisoprolol.  3. COPD-no acute exacerbation-continue albuterol inhaler as needed.  4. Anxiety-continue BuSpar.  . Depression-continue Cymbalta.  6.  Hyperlipidemia-continue Crestor.   All the records are reviewed and case discussed with ED provider. Management plans discussed with the patient, family and they are in agreement.  CODE STATUS: Full  TOTAL TIME TAKING CARE OF THIS PATIENT: 45 minutes.    Henreitta Leber M.D on 07/27/2016 at 1:05 PM  Between 7am to 6pm - Pager - [phone]  After 6pm go to [email or web address] - password [email or web address]  Office  [phone]  CC: Primary care physician; [name], FNP

## 2016-07-27 NOTE — ED Triage Notes (Signed)
Pt with right side chest pain yesterday while cleaning her house which resolved after she sat down and took a rest. Pt presents today with tightness in the middle of her chest. NAD>

## 2016-07-27 NOTE — Progress Notes (Signed)
Pts HR is sustaining in 125-133 afib. MD notified. Order for 1 time dose of cardizem IV 10mg . I will continue to assess.

## 2016-07-27 NOTE — ED Notes (Signed)
Pt assisted to use bathroom.

## 2016-07-28 ENCOUNTER — Encounter: Payer: Self-pay | Admitting: Physician Assistant

## 2016-07-28 DIAGNOSIS — I4891 Unspecified atrial fibrillation: Secondary | ICD-10-CM | POA: Diagnosis not present

## 2016-07-28 DIAGNOSIS — R079 Chest pain, unspecified: Secondary | ICD-10-CM | POA: Diagnosis not present

## 2016-07-28 DIAGNOSIS — E782 Mixed hyperlipidemia: Secondary | ICD-10-CM

## 2016-07-28 DIAGNOSIS — E876 Hypokalemia: Secondary | ICD-10-CM | POA: Diagnosis present

## 2016-07-28 DIAGNOSIS — R0602 Shortness of breath: Secondary | ICD-10-CM

## 2016-07-28 DIAGNOSIS — M7732 Calcaneal spur, left foot: Secondary | ICD-10-CM

## 2016-07-28 DIAGNOSIS — I10 Essential (primary) hypertension: Secondary | ICD-10-CM

## 2016-07-28 DIAGNOSIS — M79672 Pain in left foot: Secondary | ICD-10-CM | POA: Diagnosis present

## 2016-07-28 DIAGNOSIS — R0789 Other chest pain: Secondary | ICD-10-CM

## 2016-07-28 LAB — BASIC METABOLIC PANEL
Anion gap: 8 (ref 5–15)
BUN: 18 mg/dL (ref 6–20)
CO2: 26 mmol/L (ref 22–32)
Calcium: 8.5 mg/dL — ABNORMAL LOW (ref 8.9–10.3)
Chloride: 104 mmol/L (ref 101–111)
Creatinine, Ser: 0.74 mg/dL (ref 0.44–1.00)
GFR calc Af Amer: >60 mL/min (ref >60)
GFR calc non Af Amer: >60 mL/min (ref >60)
Glucose, Bld: 95 mg/dL (ref 65–99)
Potassium: 3.4 mmol/L — ABNORMAL LOW (ref 3.5–5.1)
Sodium: 138 mmol/L (ref 135–145)

## 2016-07-28 LAB — MAGNESIUM: Magnesium: 2 mg/dL (ref 1.7–2.4)

## 2016-07-28 LAB — CBC
HCT: 39.7 % (ref 35.0–47.0)
Hemoglobin: 13.7 g/dL (ref 12.0–16.0)
MCH: 29.9 pg (ref 26.0–34.0)
MCHC: 34.4 g/dL (ref 32.0–36.0)
MCV: 87 fL (ref 80.0–100.0)
Platelets: 197 10*3/uL (ref 150–440)
RBC: 4.57 MIL/uL (ref 3.80–5.20)
RDW: 15.2 % — ABNORMAL HIGH (ref 11.5–14.5)
WBC: 8.4 10*3/uL (ref 3.6–11.0)

## 2016-07-28 LAB — ECHOCARDIOGRAM COMPLETE
Height: 65 in
Weight: 4544 oz

## 2016-07-28 LAB — TSH: TSH: 2.373 u[IU]/mL (ref 0.350–4.500)

## 2016-07-28 MED ORDER — POTASSIUM CHLORIDE CRYS ER 20 MEQ PO TBCR
30.0000 meq | EXTENDED_RELEASE_TABLET | Freq: Two times a day (BID) | ORAL | Status: AC
  Administered 2016-07-28 (×2): 30 meq via ORAL
  Filled 2016-07-28 (×2): qty 1

## 2016-07-28 MED ORDER — APIXABAN 5 MG PO TABS: 5.0000 mg | ORAL_TABLET | Freq: Two times a day (BID) | ORAL | 1 refills | Status: DC

## 2016-07-28 MED ORDER — DILTIAZEM HCL ER COATED BEADS 120 MG PO CP24: 120.0000 mg | ORAL_CAPSULE | Freq: Every day | ORAL | 1 refills | Status: DC

## 2016-07-28 MED ORDER — DILTIAZEM HCL 60 MG PO TABS
60.0000 mg | ORAL_TABLET | Freq: Three times a day (TID) | ORAL | Status: DC
  Administered 2016-07-28 – 2016-07-29 (×4): 60 mg via ORAL
  Filled 2016-07-28 (×4): qty 1

## 2016-07-28 NOTE — Progress Notes (Signed)
Denies chest pain,remains afib rate controlled with cardizem,vital signs within normal limits,close monitoring in progress.

## 2016-07-28 NOTE — Care Management CC44 (Signed)
Condition Code 44 Documentation Completed  Patient Details  Name: Aayra Trach MRN: [id number] Date of Birth: May 13, 1951   Condition Code 44 given:  Yes Patient signature on Condition Code 44 notice:   yes Documentation of 2 MD's agreement:   yes - per Beau Fanny RN Code 44 added to claim:   yes    Katrina Stack, RN 07/28/2016, 12:29 PM

## 2016-07-28 NOTE — Consult Note (Signed)
Cardiology Consultation Note  Patient ID: Amber Barnes, MRN: [id number], DOB/AGE: [birth year] 66 y.o. Admit date: 07/27/2016   Date of Consult: 07/28/2016 Primary Physician: [name], [place] Primary Cardiologist: Dr. [name], MD Requesting Physician: Dr. Verdell Carmine, MD  Chief Complaint: SOB/fatigue Reason for Consult: New onset Afib with RVR  HPI: 66 y.o. female with h/o benign thyroid tumor s/p lobectomy 25 years ago not requiring replacement therapy, HTN, HLD, COPD 2/2 prior tobacco abuse of for 10 years at 0.25 packs daily (2.5 pack years), morbid obesity, chronic back pain, left foot pain, and snoring who presented for planned outpatient left foot surgery 2/2 bone spur and was found to be in new onset Afib with RVR in pre-op.   Patient denies any previously known cardiac history. She reports having had a stress test many years ago through [facility] that she reports was normal. She has been followed by Dr. [name] for HTN and HLD and was doing well at this last visit in October 2017. She has been dealing with chronic back pain and left foot pain for quite a while now. This has limited her ability to exercise 2/2 pain. She had planned to have an outpatient surgery done on 07/27/16 for excision of a bone spur in the left foot. In pre-op she was noted to be in new onset Afib with RVR and was sent to the the ED.   Upon questioning the patient today she reports for the past 3 weeks she has been noting increase in SOB and fatigue. She does attempt to climb the stairs at her house for exercise, but this has lead to significant SOB over the past 3 weeks. She thought this was related to a possible self diagnosed flu. Over this past weekend (4-5 days ago) she had persistent nausea without vomiting or diarrhea. This was associated with substernal chest pain that was pressure-like and without radiation. There was no increase in her SOB from what she has been noting the prior 3 weeks. No palpitations,  dizziness, presyncope, or syncope. She has never been told she has an irregular heart beat or Afib.   Upon the patient's arrival to [facility] they were found to have troponin negative x 4, K+ 3.8-->3.4, SCrf 0.85, Na 140, wbc 8.9, hgb 14.1, plt 204. Recent TSH from 05/2016 normal. ECG showed Afib with RVR as below, CXR showed borderline cardiomegaly without acute cardiopulmonary process. She was started on IV Cardizem pushes prn and po Cardizem with heart rates being controlled in the 80's to 90's bpm at rest. She was also started on Eliquis 5 mg bid upon admission. She will become tachycardic with heart rates into the 150's bpm with ambulation in her room. She remains in Afib at this time and denies chest pain or SOB. She does become SOB with ambulation. Echo done on 07/27/16 showed an EF of 55-60%, no RWMA, study not technically sufficient to allow for LV diastolic function. Mild mitral regurgitation, left atrium was mildly dilated at 40 mm, RV systolic function was normal. PASP could not be estimated. Rhythm was Afib with RVR.   She does note significant snoring and has never had a sleep study.   Past Medical History:  Diagnosis Date  . Achilles tendinitis   . Acute medial meniscal tear   . Allergy   . Anxiety   . Arthritis   . Atrial fibrillation ([place])    a. new onset 07/2016; b. CHADS2VASc --> 2 (HTN, female); c. eliquis  . Chronic lower back pain   .  COPD (chronic obstructive pulmonary disease) ([place])   . Depression   . Fibrocystic breast disease   . GERD (gastroesophageal reflux disease)   . Hyperlipidemia   . Hypertension   . Insomnia   . Lipoma   . Osteoarthritis    left knee  . Osteopenia   . Tendonitis   . Thyroid disease   . Vitamin D deficiency       Most Recent Cardiac Studies: Echo 07/27/2016: Study Conclusions  - Left ventricle: The cavity size was normal. There was mild   concentric hypertrophy. Systolic function was normal. The   estimated ejection fraction was in the  range of 55% to 60%. Wall   motion was normal; there were no regional wall motion   abnormalities. The study is not technically sufficient to allow   evaluation of LV diastolic function. - Mitral valve: There was mild regurgitation. - Left atrium: The atrium was mildly dilated. - Right ventricle: Systolic function was normal. - Pulmonary arteries: Systolic pressure could not be accurately   estimated.  Impressions:  - Rhythm is atrial fibrillation with RVR.   Surgical History:  Past Surgical History:  Procedure Laterality Date  . ABDOMINAL HYSTERECTOMY     ovaries left  . ACHILLES TENDON SURGERY    . BACK SURGERY    . COLONOSCOPY    . ESOPHAGOGASTRODUODENOSCOPY    . THYROIDECTOMY     thyroid lobectomy secondary to a benign nodule  . TOTAL THYROIDECTOMY       Home Meds: Prior to Admission medications   Medication Sig Start Date End Date Taking? Authorizing Provider  aspirin 81 MG tablet Take 81 mg by mouth daily.   Yes Historical Provider, MD  B Complex Vitamins (VITAMIN B COMPLEX PO) Take by mouth daily.   Yes Historical Provider, MD  bisoprolol (ZEBETA) 10 MG tablet Take 10 mg by mouth 2 (two) times daily.    Yes Historical Provider, MD  busPIRone (BUSPAR) 10 MG tablet Take 10 mg by mouth 2 (two) times daily.  04/29/16  Yes Historical Provider, MD  CALCIUM PO Take 500 mg by mouth daily.    Yes Historical Provider, MD  Cholecalciferol (VITAMIN D PO) Take 5,000 mg by mouth daily.    Yes Historical Provider, MD  cloNIDine (CATAPRES) 0.1 MG tablet Take 0.1 mg by mouth daily. 05/16/16  Yes Historical Provider, MD  DULoxetine (CYMBALTA) 60 MG capsule Take 120 mg by mouth daily.   Yes Historical Provider, MD  losartan-hydrochlorothiazide (HYZAAR) 100-25 MG per tablet Take 1 tablet by mouth daily.   Yes Historical Provider, MD  Multiple Vitamin (MULTIVITAMIN) tablet Take 1 tablet by mouth daily.   Yes Historical Provider, MD  rosuvastatin (CRESTOR) 20 MG tablet Take 20 mg by mouth  every evening.    Yes Historical Provider, MD  albuterol (PROVENTIL HFA) 108 (90 Base) MCG/ACT inhaler Inhale 2 puffs into the lungs every 6 (six) hours as needed for wheezing or shortness of breath. 06/21/16   [name], FNP  apixaban (ELIQUIS) 5 MG TABS tablet Take 1 tablet (5 mg total) by mouth 2 (two) times daily. 07/28/16   Henreitta Leber, MD  benzonatate (TESSALON) 100 MG capsule Take 1 capsule (100 mg total) by mouth 2 (two) times daily as needed for cough. 06/24/16   [name], FNP  diltiazem (CARDIZEM CD) 120 MG 24 hr capsule Take 1 capsule (120 mg total) by mouth daily. 07/28/16   Henreitta Leber, MD  enoxaparin (LOVENOX) 40 MG/0.4ML  injection Inject 0.4 mLs (40 mg total) into the skin daily. 07/14/16   Trula Slade, DPM  NONFORMULARY OR COMPOUNDED ITEM [facility]:  Antiinflammatory Cream - Diclofenac 3%, Baclofen 2%, Cyclobenzaprine 2%, Lidocaine 2%, apply 1-2 grams to affected are 3-4 times daily. Patient not taking: Reported on 07/27/2016 12/28/15   Trula Slade, DPM  pantoprazole (PROTONIX) 40 MG tablet Take 1 tablet (40 mg total) by mouth daily. Patient not taking: Reported on 07/27/2016 0000000   Delora Fuel, MD  tizanidine (ZANAFLEX) 2 MG capsule Take 1 capsule (2 mg total) by mouth at bedtime as needed for muscle spasms. Patient not taking: Reported on 07/27/2016 05/17/16   [name], FNP    Inpatient Medications:  . apixaban  5 mg Oral BID  . bisoprolol  10 mg Oral BID  . busPIRone  10 mg Oral BID  . calcium carbonate  500 mg Oral Daily  . cholecalciferol  400 Units Oral Daily  . cloNIDine  0.1 mg Oral Daily  . diltiazem  30 mg Oral Q6H  . DULoxetine  120 mg Oral Daily  . losartan  100 mg Oral Daily   And  . hydrochlorothiazide  25 mg Oral Daily  . multivitamin with minerals  1 tablet Oral Daily  . rosuvastatin  20 mg Oral QPM  . sodium chloride flush  3 mL Intravenous Q12H     Allergies:  Allergies  Allergen Reactions  .  Prednisone Other (See Comments)    Agitation, hyperactivity, polyphagia  . Hydrocodone-Acetaminophen Other (See Comments)    Hallucinations and combative  . Adhesive [Tape] Itching and Rash  . Latex Itching and Rash    use paper tape    Social History   Social History  . Marital status: Married    Spouse name: N/A  . Number of children: N/A  . Years of education: N/A   Occupational History  . Not on file.   Social History Main Topics  . Smoking status: Former Smoker    Packs/day: 0.25    Years: 10.00    Types: Cigarettes  . Smokeless tobacco: Never Used     Comment: smoked for 10 years  . Alcohol use Yes     Comment: social, occasional  . Drug use: No  . Sexual activity: No   Other Topics Concern  . Not on file   Social History Narrative   Moved to [place] from [place].       Grandmother.          Family History  Problem Relation Age of Onset  . Cancer Sister 29    ovarian  . Cancer Father 20    colon  . Heart disease Father   . Heart attack Father 28    died from MI  . Hyperlipidemia Father   . Hypertension Father   . Cancer Mother     leukemia  . Breast cancer Paternal Aunt     28's     Review of Systems: Review of Systems  Constitutional: Positive for malaise/fatigue. Negative for chills, diaphoresis, fever and weight loss.  HENT: Negative for congestion.   Eyes: Negative for discharge and redness.  Respiratory: Positive for shortness of breath. Negative for cough, hemoptysis, sputum production and wheezing.   Cardiovascular: Positive for chest pain. Negative for palpitations, orthopnea, claudication, leg swelling and PND.  Gastrointestinal: Positive for nausea. Negative for abdominal pain, blood in stool, heartburn, melena and vomiting.  Genitourinary: Negative for hematuria.  Musculoskeletal: Positive for  joint pain and myalgias. Negative for falls.       Chronic low back pain and left foot pain  Skin: Negative for rash.  Neurological:  Positive for weakness. Negative for dizziness, tingling, tremors, sensory change, speech change, focal weakness and loss of consciousness.  Endo/Heme/Allergies: Does not bruise/bleed easily.  Psychiatric/Behavioral: Negative for substance abuse. The patient is not nervous/anxious.     Labs:  Recent Labs  07/27/16 1038 07/27/16 1347 07/27/16 1657 07/27/16 2130  TROPONINI <0.03 <0.03 <0.03 <0.03   Lab Results  Component Value Date   WBC 8.4 07/28/2016   HGB 13.7 07/28/2016   HCT 39.7 07/28/2016   MCV 87.0 07/28/2016   PLT 197 07/28/2016     Recent Labs Lab 07/28/16 0408  NA 138  K 3.4*  CL 104  CO2 26  BUN 18  CREATININE 0.74  CALCIUM 8.5*  GLUCOSE 95   Lab Results  Component Value Date   CHOL 168 05/12/2016   HDL 62.50 05/12/2016   LDLCALC 81 05/12/2016   TRIG 125.0 05/12/2016   No results found for: DDIMER  Radiology/Studies:  Dg Chest 2 View  Result Date: 07/27/2016 CLINICAL DATA:  Chest pain.  Atrial fibrillation. EXAM: CHEST  2 VIEW COMPARISON:  None. FINDINGS: Borderline cardiomegaly. Pulmonary vascularity is normal and the lungs are clear. No effusions. Slight calcification in the thoracic aorta. No bone abnormality. IMPRESSION: Borderline cardiomegaly. Aortic atherosclerosis. Electronically Signed   By: Lorriane Shire M.D.   On: 07/27/2016 11:02   Ct Abdomen Pelvis W Contrast  Result Date: 06/30/2016 CLINICAL DATA:  Family history of ovarian cancer, smoker, hysterectomy EXAM: CT ABDOMEN AND PELVIS WITH CONTRAST TECHNIQUE: Multidetector CT imaging of the abdomen and pelvis was performed using the standard protocol following bolus administration of intravenous contrast. CONTRAST:  100 cc Isovue 370 COMPARISON:  None available FINDINGS: Lower chest: Clear lung bases. Normal heart size. No pericardial or pleural effusion. Small hiatal hernia evident. Hepatobiliary: Mild diffuse hypoattenuation of the liver parenchyma compatible with hepatic steatosis. No focal  hepatic abnormality or biliary dilatation. Gallbladder and biliary system within normal limits for age. Pancreas: Unremarkable. No pancreatic ductal dilatation or surrounding inflammatory changes. Spleen: Normal in size without focal abnormality. Adrenals/Urinary Tract: Normal adrenal glands. Scattered hypodense renal cortical cysts bilaterally, largest in the left midpole posteriorly measures 17 mm, image 30. No renal obstruction or hydronephrosis. Ureters and urinary bladder unremarkable. Stomach/Bowel: Negative for bowel obstruction, significant dilatation, ileus, or free air. Normal appendix demonstrated extending into the pelvis over the iliac vessels. Minor sigmoid diverticulosis without acute inflammatory process. No fluid collections, free fluid or abscess. Vascular/Lymphatic: Aortoiliac atherosclerosis noted without occlusive process or aneurysm. No dissection or retroperitoneal abnormality. No adenopathy. Reproductive: Previous hysterectomy.  No pelvic or adnexal mass. Other: No abdominal wall hernia or abnormality. No abdominopelvic ascites. Musculoskeletal: Degenerative changes of the spine with vacuum disc phenomenon at L5-S1. No acute osseous finding or compression fracture. IMPRESSION: No acute intra-abdominal or pelvic finding. Mild hepatic steatosis Small hiatal hernia Incidental bilateral renal cysts Aortic atherosclerosis without aneurysm Minor sigmoid diverticulosis Remote hysterectomy Electronically Signed   By: Jerilynn Mages.  Shick M.D.   On: 06/30/2016 09:54   Dg Foot Complete Left  Result Date: 07/15/2016 SEE PROGRESS NOTE FOR X-RAY REPORT    EKG: Interpreted by me showed: Afib with RVR, 148 bpm, nonspecific st/t changes Telemetry: Interpreted by me showed: Afib, 80's bpm, episodes of Afib with RVR into the 150's bpm  Weights: Filed Weights   07/27/16 1014 07/27/16  1449  Weight: 280 lb (127 kg) 284 lb (128.8 kg)     Physical Exam: Blood pressure (!) 105/58, pulse 96, temperature 97.9 F  (36.6 C), temperature source Oral, resp. rate 18, height 5\' 5"  (1.651 m), weight 284 lb (128.8 kg), SpO2 94 %. Body mass index is 47.26 kg/m. General: Well developed, well nourished, in no acute distress. Head: Normocephalic, atraumatic, sclera non-icteric, no xanthomas, nares are without discharge.  Neck: Negative for carotid bruits. JVD not elevated. Lungs: Clear bilaterally to auscultation without wheezes, rales, or rhonchi. Breathing is unlabored. Heart: Irregularly irregular with S1 S2. No murmurs, rubs, or gallops appreciated. Abdomen: Obese, soft, non-tender, non-distended with normoactive bowel sounds. No hepatomegaly. No rebound/guarding. No obvious abdominal masses. Msk:  Strength and tone appear normal for age. Extremities: No clubbing or cyanosis. No edema. Distal pedal pulses are 2+ and equal bilaterally. Neuro: Alert and oriented X 3. No facial asymmetry. No focal deficit. Moves all extremities spontaneously. Psych:  Responds to questions appropriately with a normal affect.    Assessment and Plan:  Principal Problem:   Atrial fibrillation with RVR (HCC) Active Problems:   New onset a-fib (HCC)   Hypokalemia   SOB (shortness of breath)   Morbid obesity (HCC)   Essential hypertension   Heel spur    1. New onset Afib with RVR: -Remains in Afib  -Currently rate controlled at rest with heart rates in the 80-90 bpm range -With ambulation her heart rates will increase to the 150 bpm -Increase short-acting diltiazem to 60 mg q 8 hours for added rate control -If needed for rate control could load with one-time dose of IV digoxin 0.5 mg -Continue bisoprolol 10 mg bid -Continue Eliquis 5 mg bid -CHADS2VASc at least 2 (HTN, female) -If she does not demonstrate adequate rate control or symptom improvement while ambulating with the above medications may need to TEE/DCCV prior to discharge, though will hopefully be able to management with PO medications and pursue outpatient DCCV  after she has adequately been anticoagulated in 3-4 weeks time as an outpatient -Unable to definitively state when she exactly went into this rhythm -Unclear if she actually had influenza or if this was just an manifestation of her Afib -Replete potassium to a goal of 4.0 -Check magnesium and replete to a goal of 2.0 -Recent tsh normal in 05/2016, check again this admission given her history  -Needs outpatient sleep study to evaluate for sleep apnea -Weight loss advised -New onset Afib possibly in the setting of her increase left foot pain  2. Chest pain: -Has ruled out -EKG non-acute -Schedule Lexiscan Myoview on 07/29/16 to evaluate for high-risk ischemia -Unable to treadmill 2/2 foot pain and Afib with RVR  3. SOB: -Likely multifactorial including new onset Afib with RVR, morbid obesity, and deconditioning -Echo with normal LV systolic function -Weight loss advised  4. Left foot pain/bone spur: -Will need to defer outpatient surgery at this time given need for full-dose anticoagulation until this can be safely stopped s/p restoration of sinus rhythm -Defer pain management to primary team and PCP  5. Hypokalemia: -Replete to goal of 4.0 as above -Check magnesium  6. Morbid obesity: -Weight loss advised  7. HTN: -BP well controlled -Hold HCTZ at this time given hypokalemia and need to titrate rate-controlling medications -Monitor   8. HLD: -Crestor  9. History of benign thyroid tumor: -Not on replacement therapy -Check tsh  10. COPD: -Does not appear to be an active exacerbation    Signed, Thurmond Butts  [facility] [place] Pager: [phone] 07/28/2016, 10:48 AM

## 2016-07-28 NOTE — Care Management Obs Status (Signed)
[facility] NOTIFICATION   Patient Details  Name: Nathan Gopalakrishnan MRN: [id number] Date of Birth: May 03, 1951   Medicare Observation Status Notification Given:  Yes    Katrina Stack, RN 07/28/2016, 12:29 PM

## 2016-07-28 NOTE — Progress Notes (Signed)
[facility] [place] NAME: Amber Barnes    MR#:  [id number]  DATE OF BIRTH:  20-Dec-1950  SUBJECTIVE:   Patient here due to new onset atrial fibrillation. Complaining of some mild chest pain today. Heart rates to get elevated on minimal ambulation. Seen by cardiology and plan for stress test in the morning. Patient's rate controlling meds have been escalated.  REVIEW OF SYSTEMS:    Review of Systems  Constitutional: Negative for chills and fever.  HENT: Negative for congestion and tinnitus.   Eyes: Negative for blurred vision and double vision.  Respiratory: Negative for cough, shortness of breath and wheezing.   Cardiovascular: Positive for chest pain. Negative for orthopnea and PND.  Gastrointestinal: Negative for abdominal pain, diarrhea, nausea and vomiting.  Genitourinary: Negative for dysuria and hematuria.  Neurological: Negative for dizziness, sensory change and focal weakness.  All other systems reviewed and are negative.   Nutrition: Heart Healthy Tolerating Diet: Yes Tolerating PT: Ambulatory   DRUG ALLERGIES:   Allergies  Allergen Reactions  . Prednisone Other (See Comments)    Agitation, hyperactivity, polyphagia  . Hydrocodone-Acetaminophen Other (See Comments)    Hallucinations and combative  . Adhesive [Tape] Itching and Rash  . Latex Itching and Rash    use paper tape    VITALS:  Blood pressure 104/69, pulse (!) 106, temperature 98.3 F (36.8 C), temperature source Oral, resp. rate 18, height 5\' 5"  (1.651 m), weight 128.8 kg (284 lb), SpO2 95 %.  PHYSICAL EXAMINATION:   Physical Exam  GENERAL:  66 y.o.-year-old obese patient lying in the bed in no acute distress.  EYES: Pupils equal, round, reactive to light and accommodation. No scleral icterus. Extraocular muscles intact.  HEENT: Head atraumatic, normocephalic. Oropharynx and nasopharynx clear.  NECK:  Supple, no jugular venous distention. No thyroid  enlargement, no tenderness.  LUNGS: Normal breath sounds bilaterally, no wheezing, rales, rhonchi. No use of accessory muscles of respiration.  CARDIOVASCULAR: S1, S2, Irregular. No murmurs, rubs, or gallops.  ABDOMEN: Soft, nontender, nondistended. Bowel sounds present. No organomegaly or mass.  EXTREMITIES: No cyanosis, clubbing or edema b/l.    NEUROLOGIC: Cranial nerves II through XII are intact. No focal Motor or sensory deficits b/l.   PSYCHIATRIC: The patient is alert and oriented x 3.  SKIN: No obvious rash, lesion, or ulcer.    LABORATORY PANEL:   CBC  Recent Labs Lab 07/28/16 0408  WBC 8.4  HGB 13.7  HCT 39.7  PLT 197   ------------------------------------------------------------------------------------------------------------------  Chemistries   Recent Labs Lab 07/28/16 0408  NA 138  K 3.4*  CL 104  CO2 26  GLUCOSE 95  BUN 18  CREATININE 0.74  CALCIUM 8.5*  MG 2.0   ------------------------------------------------------------------------------------------------------------------  Cardiac Enzymes  Recent Labs Lab 07/27/16 2130  TROPONINI <0.03   ------------------------------------------------------------------------------------------------------------------  RADIOLOGY:  Dg Chest 2 View  Result Date: 07/27/2016 CLINICAL DATA:  Chest pain.  Atrial fibrillation. EXAM: CHEST  2 VIEW COMPARISON:  None. FINDINGS: Borderline cardiomegaly. Pulmonary vascularity is normal and the lungs are clear. No effusions. Slight calcification in the thoracic aorta. No bone abnormality. IMPRESSION: Borderline cardiomegaly. Aortic atherosclerosis. Electronically Signed   By: Lorriane Shire M.D.   On: 07/27/2016 11:02     ASSESSMENT AND PLAN:   66 year old female with past medical history of hypertension, hyperlipidemia, GERD, depression, COPD, anxiety, osteoarthritis who presents to the hospital due to shortness of breath and noted to be in new onset atrial  fibrillation.  1. Atrial fibrillation with rapid ventricular response - HR are still variable on minimal exertion going up to the 150's.   - cont. IV Cardizem PRN, Advance Oral cardizem to 60 mg q8 hrs as per Cards. Could use DIg If needed.  - await Echo results.  - cont. Eliquis.  Appreciate Cards input and if not improving with meds consider Cardioversion but will defer to Cardiology.   2. Chest Pain - atypical CP.  Ruled out by cardiac markers.  - does have risk factors.  Appreciate Cards input and plan for Myoview in a.m.   3. Essential hypertension- BP on the low side and some meds held this a.m  - d/c Clonidine, Losartan/HCTZ and cont. Bisoprolol and Cardizem and monitor.   4. COPD-no acute exacerbation-continue albuterol inhaler as needed.  5. Anxiety-continue BuSpar.  6. Depression-continue Cymbalta.  7. Hyperlipidemia-continue Crestor.   Possible d/c home tomorrow if HR stable and MYoview is (-)  All the records are reviewed and case discussed with Care Management/Social Worker. Management plans discussed with the patient, family and they are in agreement.  CODE STATUS: Full Code  DVT Prophylaxis: Eliquis  TOTAL TIME TAKING CARE OF THIS PATIENT: 30 minutes.   POSSIBLE D/C IN 1-2 DAYS, DEPENDING ON CLINICAL CONDITION.   Henreitta Leber M.D on 07/28/2016 at 12:55 PM  Between 7am to 6pm - Pager - [phone]  After 6pm go to [email or web address] - Proofreader  [facility]  Office  [phone]  CC: Primary care physician; [name], FNP

## 2016-07-28 NOTE — Care Management (Addendum)
Patient admitted with new onset atrial fibrillation then changed to observation per insurance company request.  code 54.  Patient is to have a stress test 1.19.2018 due to increased heart rate.  Has required IV push cardizem and change in oral cardizem dose.  Patient to discharge home on Eliquis.  She has never been on this medication before.  Confirms that she had pharmacy coverage.  Provided with 30 day trial coupon

## 2016-07-29 ENCOUNTER — Inpatient Hospital Stay (HOSPITAL_BASED_OUTPATIENT_CLINIC_OR_DEPARTMENT_OTHER): Payer: Medicare Other

## 2016-07-29 ENCOUNTER — Encounter: Payer: Self-pay | Admitting: Radiology

## 2016-07-29 DIAGNOSIS — R079 Chest pain, unspecified: Secondary | ICD-10-CM | POA: Diagnosis not present

## 2016-07-29 DIAGNOSIS — I4891 Unspecified atrial fibrillation: Secondary | ICD-10-CM | POA: Diagnosis not present

## 2016-07-29 LAB — NM MYOCAR MULTI W/SPECT W/WALL MOTION / EF
Estimated workload: 1 METS
Exercise duration (min): 0 min
Exercise duration (sec): 0 s
LV dias vol: 107 mL (ref 46–106)
LV sys vol: 51 mL
MPHR: 157 {beats}/min
Peak HR: 74 {beats}/min
Percent HR: 47 %
Rest HR: 56 {beats}/min
SDS: 1
SRS: 3
SSS: 1
TID: 1.02

## 2016-07-29 MED ORDER — BISOPROLOL FUMARATE 10 MG PO TABS: 10.0000 mg | ORAL_TABLET | Freq: Two times a day (BID) | ORAL | 1 refills | Status: DC

## 2016-07-29 MED ORDER — TECHNETIUM TC 99M TETROFOSMIN IV KIT
13.0000 | PACK | Freq: Once | INTRAVENOUS | Status: AC | PRN
  Administered 2016-07-29: 13 via INTRAVENOUS

## 2016-07-29 MED ORDER — DILTIAZEM HCL ER COATED BEADS 120 MG PO CP24: 120.0000 mg | ORAL_CAPSULE | Freq: Every day | ORAL | Status: DC

## 2016-07-29 MED ORDER — TECHNETIUM TC 99M TETROFOSMIN IV KIT
30.0000 | PACK | Freq: Once | INTRAVENOUS | Status: AC | PRN
  Administered 2016-07-29: 29.51 via INTRAVENOUS

## 2016-07-29 MED ORDER — REGADENOSON 0.4 MG/5ML IV SOLN
0.4000 mg | Freq: Once | INTRAVENOUS | Status: AC
  Administered 2016-07-29: 0.4 mg via INTRAVENOUS
  Filled 2016-07-29: qty 5

## 2016-07-29 MED ORDER — DILTIAZEM HCL ER COATED BEADS 180 MG PO CP24: 180.0000 mg | ORAL_CAPSULE | Freq: Every day | ORAL | Status: DC

## 2016-07-29 MED ORDER — DILTIAZEM HCL ER COATED BEADS 180 MG PO CP24: 180.0000 mg | ORAL_CAPSULE | Freq: Every day | ORAL | 2 refills | Status: DC

## 2016-07-29 NOTE — Progress Notes (Signed)
A&O. Independent. SLept all night. Afib on tele, rate controlled. For stress test today.

## 2016-07-29 NOTE — Progress Notes (Signed)
ANTICOAGULATION CONSULT NOTE -follow up [place] for Apixaban  Indication: atrial fibrillation  Allergies  Allergen Reactions  . Prednisone Other (See Comments)    Agitation, hyperactivity, polyphagia  . Hydrocodone-Acetaminophen Other (See Comments)    Hallucinations and combative  . Adhesive [Tape] Itching and Rash  . Latex Itching and Rash    use paper tape    Patient Measurements: Height: 5\' 5"  (165.1 cm) Weight: 284 lb (128.8 kg) IBW/kg (Calculated) : 57 Heparin Dosing Weight:   Vital Signs: Temp: 97.8 F (36.6 C) (01/19 0503) Temp Source: Oral (01/19 0503) BP: 113/70 (01/19 0503) Pulse Rate: 101 (01/19 0504)  Labs:  Recent Labs  07/27/16 1038 07/27/16 1347 07/27/16 1657 07/27/16 2130 07/28/16 0408  HGB 14.1  --   --   --  13.7  HCT 41.6  --   --   --  39.7  PLT 204  --   --   --  197  CREATININE 0.85  --   --   --  0.74  TROPONINI <0.03 <0.03 <0.03 <0.03  --     Estimated Creatinine Clearance: 97.4 mL/min (by C-G formula based on SCr of 0.74 mg/dL).   Medical History: Past Medical History:  Diagnosis Date  . Achilles tendinitis   . Acute medial meniscal tear   . Allergy   . Anxiety   . Arthritis   . Atrial fibrillation ([place])    a. new onset 07/2016; b. CHADS2VASc --> 2 (HTN, female); c. eliquis  . Chronic lower back pain   . COPD (chronic obstructive pulmonary disease) ([place])   . Depression   . Fibrocystic breast disease   . GERD (gastroesophageal reflux disease)   . Hyperlipidemia   . Hypertension   . Insomnia   . Lipoma   . Osteoarthritis    left knee  . Osteopenia   . Tendonitis   . Thyroid disease   . Vitamin D deficiency     Medications:  Facility-Administered Medications Prior to Admission  Medication Dose Route Frequency Provider Last Rate Last Dose  . albuterol (PROVENTIL) (2.5 MG/3ML) 0.083% nebulizer solution 2.5 mg  2.5 mg Nebulization Once [name], FNP       Prescriptions Prior to Admission   Medication Sig Dispense Refill Last Dose  . aspirin 81 MG tablet Take 81 mg by mouth daily.   Past Week at 0800  . B Complex Vitamins (VITAMIN B COMPLEX PO) Take by mouth daily.   07/27/2016 at 0500  . bisoprolol (ZEBETA) 10 MG tablet Take 10 mg by mouth 2 (two) times daily.    07/27/2016 at 0500  . busPIRone (BUSPAR) 10 MG tablet Take 10 mg by mouth 2 (two) times daily.    07/27/2016 at 0500  . CALCIUM PO Take 500 mg by mouth daily.    07/27/2016 at 0500  . Cholecalciferol (VITAMIN D PO) Take 5,000 mg by mouth daily.    07/27/2016 at 0500  . cloNIDine (CATAPRES) 0.1 MG tablet Take 0.1 mg by mouth daily.   07/27/2016 at 0500  . DULoxetine (CYMBALTA) 60 MG capsule Take 120 mg by mouth daily.   07/27/2016 at 0500  . losartan-hydrochlorothiazide (HYZAAR) 100-25 MG per tablet Take 1 tablet by mouth daily.   07/27/2016 at 0500  . Multiple Vitamin (MULTIVITAMIN) tablet Take 1 tablet by mouth daily.   07/27/2016 at 0500  . rosuvastatin (CRESTOR) 20 MG tablet Take 20 mg by mouth every evening.    07/26/2016 at 1800  .  albuterol (PROVENTIL HFA) 108 (90 Base) MCG/ACT inhaler Inhale 2 puffs into the lungs every 6 (six) hours as needed for wheezing or shortness of breath. 1 Inhaler 1 prn at prn  . benzonatate (TESSALON) 100 MG capsule Take 1 capsule (100 mg total) by mouth 2 (two) times daily as needed for cough. 20 capsule 0 prn at prn  . enoxaparin (LOVENOX) 40 MG/0.4ML injection Inject 0.4 mLs (40 mg total) into the skin daily. 14 Syringe 0   . NONFORMULARY OR COMPOUNDED ITEM [facility]:  Antiinflammatory Cream - Diclofenac 3%, Baclofen 2%, Cyclobenzaprine 2%, Lidocaine 2%, apply 1-2 grams to affected are 3-4 times daily. (Patient not taking: Reported on 07/27/2016) 120 each 6 Not Taking at Unknown time  . pantoprazole (PROTONIX) 40 MG tablet Take 1 tablet (40 mg total) by mouth daily. (Patient not taking: Reported on 07/27/2016) 30 tablet 0 Not Taking at Unknown time  . tizanidine (ZANAFLEX) 2 MG capsule Take  1 capsule (2 mg total) by mouth at bedtime as needed for muscle spasms. (Patient not taking: Reported on 07/27/2016) 15 capsule 0 Not Taking at Unknown time    Assessment: Pharmacy consulted to dose Eliquis in this 66 year old female with non valvular Afib. TBW = 128.8 kg CrCl = 91.7 ml/min  Goal of Therapy:  prevention of thromboembolism   Plan:  Eliquis 5 mg PO X 1 given on 1/17 @ 14:00. Eliquis 5 mg PO BID ordered to start on 1/17 @ 22:00.   Therin Vetsch A 07/29/2016,9:42 AM

## 2016-07-29 NOTE — Progress Notes (Signed)
Progress Note  Patient Name: Amber Barnes Date of Encounter: 07/29/2016  Primary Cardiologist: Johnny Bridge, MD   Subjective   Had brief episode of c/p overnight.  For lexiscan myoview today.  Remains in rate-controlled afib.  Inpatient Medications    Scheduled Meds: . apixaban  5 mg Oral BID  . bisoprolol  10 mg Oral BID  . busPIRone  10 mg Oral BID  . calcium carbonate  500 mg Oral Daily  . cholecalciferol  400 Units Oral Daily  . diltiazem  60 mg Oral Q8H  . DULoxetine  120 mg Oral Daily  . multivitamin with minerals  1 tablet Oral Daily  . rosuvastatin  20 mg Oral QPM  . sodium chloride flush  3 mL Intravenous Q12H   Continuous Infusions:  PRN Meds: acetaminophen **OR** acetaminophen, albuterol, benzonatate, diltiazem, ondansetron **OR** ondansetron (ZOFRAN) IV   Vital Signs    Vitals:   07/28/16 1355 07/28/16 1956 07/29/16 0503 07/29/16 0504  BP: 111/85 115/87 113/70   Pulse: 82 83 (!) 139 (!) 101  Resp:  18    Temp:  98.3 F (36.8 C) 97.8 F (36.6 C)   TempSrc:  Oral Oral   SpO2:  97% 95%   Weight:      Height:        Intake/Output Summary (Last 24 hours) at 07/29/16 1019 Last data filed at 07/29/16 0300  Gross per 24 hour  Intake              480 ml  Output              200 ml  Net              280 ml   Filed Weights   07/27/16 1014 07/27/16 1449  Weight: 280 lb (127 kg) 284 lb (128.8 kg)    Physical Exam   GEN: Well nourished, well developed, in no acute distress.  HEENT: Grossly normal.  Neck: Supple, no JVD, carotid bruits, or masses. Cardiac: IR, IR, brady, no murmurs, rubs, or gallops. No clubbing, cyanosis, edema.  Radials/DP/PT 2+ and equal bilaterally.  Respiratory:  Respirations regular and unlabored, clear to auscultation bilaterally. GI: Soft, nontender, nondistended, BS + x 4. MS: no deformity or atrophy. Skin: warm and dry, no rash. Neuro:  Strength and sensation are intact. Psych: AAOx3.  Normal affect.  Labs      Chemistry Recent Labs Lab 07/27/16 1038 07/28/16 0408  NA 140 138  K 3.8 3.4*  CL 104 104  CO2 28 26  GLUCOSE 101* 95  BUN 18 18  CREATININE 0.85 0.74  CALCIUM 9.2 8.5*  GFRNONAA >60 >60  GFRAA >60 >60  ANIONGAP 8 8     Hematology Recent Labs Lab 07/27/16 1038 07/28/16 0408  WBC 8.9 8.4  RBC 4.78 4.57  HGB 14.1 13.7  HCT 41.6 39.7  MCV 87.2 87.0  MCH 29.5 29.9  MCHC 33.8 34.4  RDW 14.7* 15.2*  PLT 204 197    Cardiac Enzymes Recent Labs Lab 07/27/16 1038 07/27/16 1347 07/27/16 1657 07/27/16 2130  TROPONINI <0.03 <0.03 <0.03 <0.03   Radiology    Dg Chest 2 View  Result Date: 07/27/2016 CLINICAL DATA:  Chest pain.  Atrial fibrillation. EXAM: CHEST  2 VIEW COMPARISON:  None. FINDINGS: Borderline cardiomegaly. Pulmonary vascularity is normal and the lungs are clear. No effusions. Slight calcification in the thoracic aorta. No bone abnormality. IMPRESSION: Borderline cardiomegaly. Aortic atherosclerosis. Electronically Signed   By: Jeneen Rinks  Maxwell M.D.   On: 07/27/2016 11:02    Telemetry    Seen in nuc med - afib 50's to 70's.  Cardiac Studies   2D Echocardiogram 1.17.2018  Study Conclusions  - Left ventricle: The cavity size was normal. There was mild   concentric hypertrophy. Systolic function was normal. The   estimated ejection fraction was in the range of 55% to 60%. Wall   motion was normal; there were no regional wall motion   abnormalities. The study is not technically sufficient to allow   evaluation of LV diastolic function. - Mitral valve: There was mild regurgitation. - Left atrium: The atrium was mildly dilated. - Right ventricle: Systolic function was normal. - Pulmonary arteries: Systolic pressure could not be accurately   estimated.  Patient Profile     66 y.o. female w/ a h/o HTN, obesity, OA, low back pain, and bone spur who was admitted to [facility] on 1/17 with AFib and RVR and chest pain, after presenting for outpt left foot  surgery.  Assessment & Plan    1.  Afib RVR:  Duration unknown.  Possible flu a few wks ago with subsequent DOE.  Rates well-controlled currently on bisoprolol and dilt.  Look to consolidate dilt to long acting version today.  Echo showed nl EF. TSH nl.  Mg nl.  K low 1/18 (3.4).  CHA2DS2VASc = 2.  Cont eliquis. Consider DCCV in three weeks if she remains in afib.  2.  Chest pain:  In setting of #1.  Brief episode of c/p overnight.  CE negative this admission.  Echo w/ nl EF.  Lesiscan myoview this morning.  If low risk, would not pursue further ischemic eval.  3.  Essential HTN:  BP's well controlled on  blocker and dilt.  4.  HL:  LDL 81 05/2016.  Cont statin.  5.  L foot bone spur:  Pending surgical correction.  Surgery on hold for now.  Will need to be on uninterrupted oral anticoagulation for three weeks prior to DCCV (if necessary) and then for four weeks afterward.  6.  Hypokalemia:  Supplemented yesterday.  F/u K.  Signed, Murray Hodgkins, NP  07/29/2016, 10:19 AM

## 2016-07-29 NOTE — Progress Notes (Signed)
Discharge instructions explained to pt/ verbalized an understanding / iv and tele removed/ transported off unit via wheelchair.  

## 2016-07-29 NOTE — Discharge Summary (Signed)
[facility] [place] NAME: Marry Merriman    MR#:  [id number]  DATE OF BIRTH:  1951-04-21  DATE OF ADMISSION:  07/27/2016   ADMITTING PHYSICIAN: Henreitta Leber, MD  DATE OF DISCHARGE: 07/29/16  PRIMARY CARE PHYSICIAN: [name], FNP   ADMISSION DIAGNOSIS:   New onset atrial fibrillation ([place]) [I48.91] Atrial fibrillation with rapid ventricular response (H[facility]) [I48.91]  DISCHARGE DIAGNOSIS:   Principal Problem:   Atrial fibrillation with RVR ([facility]) Active Problems:   Shortness of breath   Morbid obesity ([facility])   Essential hypertension   Heel spur   New onset a-fib ([facility])   Hypokalemia   Left foot pain   Chest pain with moderate risk for cardiac etiology   SECONDARY DIAGNOSIS:   Past Medical History:  Diagnosis Date  . Achilles tendinitis   . Acute medial meniscal tear   . Allergy   . Anxiety   . Arthritis   . Atrial fibrillation ([place])    a. new onset 07/2016; b. CHADS2VASc --> 2 (HTN, female); c. eliquis  . Chronic lower back pain   . COPD (chronic obstructive pulmonary disease) ([place])   . Depression   . Fibrocystic breast disease   . GERD (gastroesophageal reflux disease)   . Hyperlipidemia   . Hypertension   . Insomnia   . Lipoma   . Osteoarthritis    left knee  . Osteopenia   . Tendonitis   . Thyroid disease   . Vitamin D deficiency     HOSPITAL COURSE:   66 year old female with past medical history of hypertension, hyperlipidemia, GERD, depression, COPD, anxiety, osteoarthritis who presents to the hospital due to shortness of breath and noted to be in new onset atrial fibrillation.  1. Atrial fibrillation with rapid ventricular response - ECHO with LVH and dilated left atrium. Patient has been asymptomatic - HR better controlled. On oral cardizem and bisoprolol bid - Myoview with small infarct but no reversible ischemia - Outpatient follow up recommended - started on eliquis for anticoagulation-  f/i in 3 weeks to assess need for cardioversion as patient complains of dyspnea associated with this. - Appreciate cardiology input.  2. Chest Pain - atypical CP.  Ruled out by cardiac markers.  - myoview low risk- minimal changes-per cardiology Outpatient follow up recommended   3. Essential hypertension- on bisoprolol and oral cardizem - d/c Clonidine, Losartan/HCTZ   4. Anxiety-continue BuSpar.  5. Depression-continue Cymbalta.  6. Hyperlipidemia-continue Crestor.  Stable and discharge today   DISCHARGE CONDITIONS:   Stable  CONSULTS OBTAINED:   Treatment Team:  Minna Merritts, MD  DRUG ALLERGIES:   Allergies  Allergen Reactions  . Prednisone Other (See Comments)    Agitation, hyperactivity, polyphagia  . Hydrocodone-Acetaminophen Other (See Comments)    Hallucinations and combative  . Adhesive [Tape] Itching and Rash  . Latex Itching and Rash    use paper tape   DISCHARGE MEDICATIONS:   Allergies as of 07/29/2016      Reactions   Prednisone Other (See Comments)   Agitation, hyperactivity, polyphagia   Hydrocodone-acetaminophen Other (See Comments)   Hallucinations and combative   Adhesive [tape] Itching, Rash   Latex Itching, Rash   use paper tape      Medication List    STOP taking these medications   aspirin 81 MG tablet   cloNIDine 0.1 MG tablet Commonly known as:  CATAPRES   enoxaparin 40 MG/0.4ML injection Commonly known as:  LOVENOX  losartan-hydrochlorothiazide 100-25 MG tablet Commonly known as:  HYZAAR   pantoprazole 40 MG tablet Commonly known as:  PROTONIX   tizanidine 2 MG capsule Commonly known as:  ZANAFLEX     TAKE these medications   albuterol 108 (90 Base) MCG/ACT inhaler Commonly known as:  PROVENTIL HFA Inhale 2 puffs into the lungs every 6 (six) hours as needed for wheezing or shortness of breath.   apixaban 5 MG Tabs tablet Commonly known as:  ELIQUIS Take 1 tablet (5 mg total) by mouth 2 (two) times  daily.   benzonatate 100 MG capsule Commonly known as:  TESSALON Take 1 capsule (100 mg total) by mouth 2 (two) times daily as needed for cough.   bisoprolol 10 MG tablet Commonly known as:  ZEBETA Take 10 mg by mouth 2 (two) times daily.   busPIRone 10 MG tablet Commonly known as:  BUSPAR Take 10 mg by mouth 2 (two) times daily.   CALCIUM PO Take 500 mg by mouth daily.   diltiazem 180 MG 24 hr capsule Commonly known as:  CARDIZEM CD Take 1 capsule (180 mg total) by mouth daily.   DULoxetine 60 MG capsule Commonly known as:  CYMBALTA Take 120 mg by mouth daily.   multivitamin tablet Take 1 tablet by mouth daily.   NONFORMULARY OR COMPOUNDED ITEM [facility]:  Antiinflammatory Cream - Diclofenac 3%, Baclofen 2%, Cyclobenzaprine 2%, Lidocaine 2%, apply 1-2 grams to affected are 3-4 times daily.   rosuvastatin 20 MG tablet Commonly known as:  CRESTOR Take 20 mg by mouth every evening.   VITAMIN B COMPLEX PO Take by mouth daily.   VITAMIN D PO Take 5,000 mg by mouth daily.        DISCHARGE INSTRUCTIONS:   1. Cardiology f/u in 2 weeks 2. PCP f/u in 1-2 weeks  DIET:   Cardiac diet  ACTIVITY:   As tolerated  OXYGEN:   Home Oxygen: No Oxygen Delivery: Room air  DISCHARGE LOCATION:   Home  If you experience worsening of your admission symptoms, develop shortness of breath, life threatening emergency, suicidal or homicidal thoughts you must seek medical attention immediately by calling 911 or calling your MD immediately  if symptoms less severe.  You Must read complete instructions/literature along with all the possible adverse reactions/side effects for all the Medicines you take and that have been prescribed to you. Take any new Medicines after you have completely understood and accpet all the possible adverse reactions/side effects.   Please note  You were cared for by a hospitalist during your hospital stay. If you have any questions about your  discharge medications or the care you received while you were in the hospital after you are discharged, you can call the unit and asked to speak with the hospitalist on call if the hospitalist that took care of you is not available. Once you are discharged, your primary care physician will handle any further medical issues. Please note that NO REFILLS for any discharge medications will be authorized once you are discharged, as it is imperative that you return to your primary care physician (or establish a relationship with a primary care physician if you do not have one) for your aftercare needs so that they can reassess your need for medications and monitor your lab values.    On the day of Discharge:  VITAL SIGNS:   Blood pressure 119/69, pulse (!) 51, temperature 98.1 F (36.7 C), temperature source Oral, resp. rate (!) 21, height 5\' 5"  (  1.651 m), weight 128.8 kg (284 lb), SpO2 98 %.  PHYSICAL EXAMINATION:    GENERAL:  66 y.o.-year-old obese patient lying in the bed with no acute distress.  EYES: Pupils equal, round, reactive to light and accommodation. No scleral icterus. Extraocular muscles intact.  HEENT: Head atraumatic, normocephalic. Oropharynx and nasopharynx clear.  NECK:  Supple, no jugular venous distention. No thyroid enlargement, no tenderness.  LUNGS: Normal breath sounds bilaterally, no wheezing, rales,rhonchi or crepitation. No use of accessory muscles of respiration. Decreased bibasilar breath sounds. CARDIOVASCULAR: S1, S2 irregular but normal rate. No murmurs, rubs, or gallops.  ABDOMEN: Soft, non-tender, non-distended. Bowel sounds present. No organomegaly or mass.  EXTREMITIES: No pedal edema, cyanosis, or clubbing.  NEUROLOGIC: Cranial nerves II through XII are intact. Muscle strength 5/5 in all extremities. Sensation intact. Gait not checked.  PSYCHIATRIC: The patient is alert and oriented x 3.  SKIN: No obvious rash, lesion, or ulcer.   DATA REVIEW:    CBC  Recent Labs Lab 07/28/16 0408  WBC 8.4  HGB 13.7  HCT 39.7  PLT 197    Chemistries   Recent Labs Lab 07/28/16 0408  NA 138  K 3.4*  CL 104  CO2 26  GLUCOSE 95  BUN 18  CREATININE 0.74  CALCIUM 8.5*  MG 2.0     Microbiology Results  No results found for this or any previous visit.  RADIOLOGY:  Nm Myocar Multi W/spect W/wall Motion / Ef  Result Date: 07/29/2016  Defect 1: There is a small defect of mild severity present in the mid anteroseptal, apical septal and apex location. Possible small anteroseptal/apical infarct with no ischemia.  This is a low risk study.  The left ventricular ejection fraction is normal (55-65%).  This study is suboptimal due to obesity and breast attenuation.      Management plans discussed with the patient, family and they are in agreement.  CODE STATUS:     Code Status Orders        Start     Ordered   07/27/16 1526  Full code  Continuous     07/27/16 1525    Code Status History    Date Active Date Inactive Code Status Order ID Comments User Context   This patient has a current code status but no historical code status.      TOTAL TIME TAKING CARE OF THIS PATIENT: 37 minutes.    Gladstone Lighter M.D on 07/29/2016 at 5:10 PM  Between 7am to 6pm - Pager - [phone]  After 6pm go to [email or web address] - Proofreader  [facility]  Office  [phone]  CC: Primary care physician; [name], FNP   Note: This dictation was prepared with Dragon dictation along with smaller phrase technology. Any transcriptional errors that result from this process are unintentional.

## 2016-08-02 ENCOUNTER — Other Ambulatory Visit: Payer: Self-pay | Admitting: Family

## 2016-08-02 ENCOUNTER — Telehealth: Payer: Self-pay | Admitting: Family

## 2016-08-02 NOTE — Telephone Encounter (Signed)
Dates have been added to chart

## 2016-08-02 NOTE — Telephone Encounter (Signed)
Pt called with some missing dates for her chart:  Tetanus shot was 11/01/2012 Shingles vaccine was 10/07/2012 Pno shot was 03/23/2012

## 2016-08-03 ENCOUNTER — Other Ambulatory Visit: Payer: Self-pay | Admitting: Family

## 2016-08-03 DIAGNOSIS — J069 Acute upper respiratory infection, unspecified: Secondary | ICD-10-CM

## 2016-08-03 NOTE — Telephone Encounter (Signed)
Pt called to have this medication refilled.   Pharmacy - [facility] [place], [place] - [address]  Call pt @ [phone]

## 2016-08-04 ENCOUNTER — Encounter: Payer: Medicare Other | Admitting: Podiatry

## 2016-08-10 ENCOUNTER — Emergency Department: Payer: Medicare Other

## 2016-08-10 ENCOUNTER — Inpatient Hospital Stay
Admission: EM | Admit: 2016-08-10 | Discharge: 2016-08-11 | DRG: 308 | Disposition: A | Discharge status: Home / Self Care | Payer: Medicare Other | Attending: Internal Medicine | Admitting: Internal Medicine

## 2016-08-10 ENCOUNTER — Encounter: Payer: Self-pay | Admitting: Emergency Medicine

## 2016-08-10 DIAGNOSIS — E890 Postprocedural hypothyroidism: Secondary | ICD-10-CM | POA: Diagnosis not present

## 2016-08-10 DIAGNOSIS — Z90710 Acquired absence of both cervix and uterus: Secondary | ICD-10-CM | POA: Diagnosis not present

## 2016-08-10 DIAGNOSIS — Z87891 Personal history of nicotine dependence: Secondary | ICD-10-CM

## 2016-08-10 DIAGNOSIS — I481 Persistent atrial fibrillation: Secondary | ICD-10-CM | POA: Diagnosis present

## 2016-08-10 DIAGNOSIS — K219 Gastro-esophageal reflux disease without esophagitis: Secondary | ICD-10-CM | POA: Diagnosis not present

## 2016-08-10 DIAGNOSIS — Z7901 Long term (current) use of anticoagulants: Secondary | ICD-10-CM

## 2016-08-10 DIAGNOSIS — Z6841 Body Mass Index (BMI) 40.0 and over, adult: Secondary | ICD-10-CM | POA: Diagnosis not present

## 2016-08-10 DIAGNOSIS — E782 Mixed hyperlipidemia: Secondary | ICD-10-CM | POA: Diagnosis present

## 2016-08-10 DIAGNOSIS — I119 Hypertensive heart disease without heart failure: Secondary | ICD-10-CM | POA: Diagnosis not present

## 2016-08-10 DIAGNOSIS — Z8249 Family history of ischemic heart disease and other diseases of the circulatory system: Secondary | ICD-10-CM

## 2016-08-10 DIAGNOSIS — Z79899 Other long term (current) drug therapy: Secondary | ICD-10-CM | POA: Diagnosis not present

## 2016-08-10 DIAGNOSIS — Z91040 Latex allergy status: Secondary | ICD-10-CM | POA: Diagnosis not present

## 2016-08-10 DIAGNOSIS — Z888 Allergy status to other drugs, medicaments and biological substances status: Secondary | ICD-10-CM | POA: Diagnosis not present

## 2016-08-10 DIAGNOSIS — Z885 Allergy status to narcotic agent status: Secondary | ICD-10-CM

## 2016-08-10 DIAGNOSIS — I4891 Unspecified atrial fibrillation: Secondary | ICD-10-CM | POA: Diagnosis present

## 2016-08-10 DIAGNOSIS — J810 Acute pulmonary edema: Secondary | ICD-10-CM

## 2016-08-10 DIAGNOSIS — N6019 Diffuse cystic mastopathy of unspecified breast: Secondary | ICD-10-CM | POA: Diagnosis not present

## 2016-08-10 DIAGNOSIS — M6281 Muscle weakness (generalized): Secondary | ICD-10-CM

## 2016-08-10 DIAGNOSIS — F329 Major depressive disorder, single episode, unspecified: Secondary | ICD-10-CM | POA: Diagnosis not present

## 2016-08-10 DIAGNOSIS — F419 Anxiety disorder, unspecified: Secondary | ICD-10-CM | POA: Diagnosis not present

## 2016-08-10 DIAGNOSIS — Z91048 Other nonmedicinal substance allergy status: Secondary | ICD-10-CM

## 2016-08-10 DIAGNOSIS — R079 Chest pain, unspecified: Secondary | ICD-10-CM

## 2016-08-10 DIAGNOSIS — J449 Chronic obstructive pulmonary disease, unspecified: Secondary | ICD-10-CM | POA: Diagnosis not present

## 2016-08-10 DIAGNOSIS — I4819 Other persistent atrial fibrillation: Secondary | ICD-10-CM | POA: Diagnosis present

## 2016-08-10 LAB — BASIC METABOLIC PANEL
Anion gap: 8 (ref 5–15)
BUN: 16 mg/dL (ref 6–20)
CO2: 23 mmol/L (ref 22–32)
Calcium: 8.6 mg/dL — ABNORMAL LOW (ref 8.9–10.3)
Chloride: 108 mmol/L (ref 101–111)
Creatinine, Ser: 0.86 mg/dL (ref 0.44–1.00)
GFR calc Af Amer: >60 mL/min (ref >60)
GFR calc non Af Amer: >60 mL/min (ref >60)
Glucose, Bld: 94 mg/dL (ref 65–99)
Potassium: 4.4 mmol/L (ref 3.5–5.1)
Sodium: 139 mmol/L (ref 135–145)

## 2016-08-10 LAB — CBC
HCT: 37.4 % (ref 35.0–47.0)
Hemoglobin: 13 g/dL (ref 12.0–16.0)
MCH: 30.1 pg (ref 26.0–34.0)
MCHC: 34.8 g/dL (ref 32.0–36.0)
MCV: 86.5 fL (ref 80.0–100.0)
Platelets: 196 10*3/uL (ref 150–440)
RBC: 4.33 MIL/uL (ref 3.80–5.20)
RDW: 14.4 % (ref 11.5–14.5)
WBC: 10 10*3/uL (ref 3.6–11.0)

## 2016-08-10 LAB — MAGNESIUM: Magnesium: 2 mg/dL (ref 1.7–2.4)

## 2016-08-10 LAB — BRAIN NATRIURETIC PEPTIDE: B Natriuretic Peptide: 278 pg/mL — ABNORMAL HIGH (ref 0.0–100.0)

## 2016-08-10 LAB — TROPONIN I: Troponin I: <0.03 ng/mL (ref <0.03)

## 2016-08-10 LAB — PROTIME-INR
INR: 1.24
Prothrombin Time: 15.7 seconds — ABNORMAL HIGH (ref 11.4–15.2)

## 2016-08-10 MED ORDER — MAGNESIUM SULFATE 2 GM/50ML IV SOLN
2.0000 g | Freq: Once | INTRAVENOUS | Status: AC
  Administered 2016-08-10: 2 g via INTRAVENOUS
  Filled 2016-08-10: qty 50

## 2016-08-10 MED ORDER — ASPIRIN-ACETAMINOPHEN-CAFFEINE 250-250-65 MG PO TABS
1.0000 | ORAL_TABLET | Freq: Once | ORAL | Status: AC
  Administered 2016-08-10: 1 via ORAL
  Filled 2016-08-10: qty 1

## 2016-08-10 MED ORDER — FUROSEMIDE 10 MG/ML IJ SOLN
20.0000 mg | Freq: Once | INTRAMUSCULAR | Status: AC
  Administered 2016-08-10: 20 mg via INTRAVENOUS
  Filled 2016-08-10: qty 4

## 2016-08-10 MED ORDER — SODIUM CHLORIDE 0.9 % IV BOLUS (SEPSIS): 1000.0000 mL | Freq: Once | INTRAVENOUS | Status: DC

## 2016-08-10 MED ORDER — DILTIAZEM HCL 25 MG/5ML IV SOLN
20.0000 mg | Freq: Once | INTRAVENOUS | Status: AC
  Administered 2016-08-10: 20 mg via INTRAVENOUS
  Filled 2016-08-10: qty 5

## 2016-08-10 MED ORDER — ASPIRIN 81 MG PO CHEW
324.0000 mg | CHEWABLE_TABLET | Freq: Once | ORAL | Status: AC
  Administered 2016-08-10: 324 mg via ORAL
  Filled 2016-08-10: qty 4

## 2016-08-10 MED ORDER — ACETAMINOPHEN 500 MG PO TABS
1000.0000 mg | ORAL_TABLET | Freq: Once | ORAL | Status: AC
  Administered 2016-08-10: 1000 mg via ORAL
  Filled 2016-08-10: qty 2

## 2016-08-10 NOTE — ED Triage Notes (Signed)
Pt presents to ED with c/o L sided lower chest pain that started approx 30 mins ago. Pt states was seen approx 2 weeks ago and dx with new onset a-fib. Pt states follow up appt is on Friday 2/2. Pt states L lower chest pain with radiation to back and some radiation to arm. Pt is alert and oriented at this time.

## 2016-08-10 NOTE — ED Notes (Signed)
Pt asked if she can eat something, this nurse checked with MD who states she can, pt notified.

## 2016-08-10 NOTE — ED Notes (Signed)
Pt states tylenol has not helped headache and reports "excedrin migraine only seems to help my headache", MD notified.

## 2016-08-10 NOTE — ED Provider Notes (Signed)
[facility] Emergency Department Provider Note  ____________________________________________  Time seen: Approximately 7:00 PM  I have reviewed the triage vital signs and the nursing notes.   HISTORY  Chief Complaint Chest Pain   HPI Amber Barnes is a 66 y.o. female the history of recently diagnosed atrial fibrillation currently on Cardizem and Eliquis, COPD, hypertension and hyperlipidemia who presents for evaluation of chest pain.Patient reports one week of progressively worsening shortness of breath both at rest and with minimal exertion. Earlier today she started having chest pain that she describes as a pressure located in the center of her chest, intermittent, nonradiating. 2 hours ago she reports a sharp pain located under her breast radiating to her back that was nonpleuritic in nature. That pain lasted an hour and is resolved. Patient denies ever having similar chest pressure before. She denies personal or family history blood clots, recent travel, leg pain or swelling, hemoptysis, she is compliant with her blood thinners.  Past Medical History:  Diagnosis Date  . Achilles tendinitis   . Acute medial meniscal tear   . Allergy   . Anxiety   . Arthritis   . Atrial fibrillation ([place])    a. new onset 07/2016; b. CHADS2VASc --> 2 (HTN, female); c. eliquis  . Chronic lower back pain   . COPD (chronic obstructive pulmonary disease) ([place])   . Depression   . Fibrocystic breast disease   . GERD (gastroesophageal reflux disease)   . Hyperlipidemia   . Hypertension   . Insomnia   . Lipoma   . Osteoarthritis    left knee  . Osteopenia   . Tendonitis   . Thyroid disease   . Vitamin D deficiency     Patient Active Problem List   Diagnosis Date Noted  . New onset a-fib ([place]) 07/28/2016  . Hypokalemia 07/28/2016  . Left foot pain 07/28/2016  . Chest pain with moderate risk for cardiac etiology   . Atrial fibrillation with RVR ([place]) 07/27/2016    . Fatty liver 06/30/2016  . Dizziness 06/08/2016  . Family history of ovarian cancer 06/08/2016  . GERD (gastroesophageal reflux disease) 05/05/2016  . Seasonal rhinitis 05/05/2016  . Routine physical examination 05/05/2016  . Heel spur 11/05/2015  . Tendonitis 11/05/2015  . Shortness of breath 11/11/2014  . Morbid obesity ([place]) 11/11/2014  . Mixed hyperlipidemia 11/11/2014  . Essential hypertension 11/11/2014  . Anxiety and depression 11/11/2014  . Arthritis, senescent 11/11/2014  . Weight loss due to medication 11/11/2014  . Obesity, Class III, BMI 40-49.9 (morbid obesity) ([place]) 04/11/2013    Past Surgical History:  Procedure Laterality Date  . ABDOMINAL HYSTERECTOMY     ovaries left  . ACHILLES TENDON SURGERY    . BACK SURGERY    . COLONOSCOPY    . ESOPHAGOGASTRODUODENOSCOPY    . THYROIDECTOMY     thyroid lobectomy secondary to a benign nodule  . TOTAL THYROIDECTOMY      Prior to Admission medications   Medication Sig Start Date End Date Taking? Authorizing Provider  albuterol (PROVENTIL HFA) 108 (90 Base) MCG/ACT inhaler Inhale 2 puffs into the lungs every 6 (six) hours as needed for wheezing or shortness of breath. 06/21/16  Yes [name], FNP  apixaban (ELIQUIS) 5 MG TABS tablet Take 1 tablet (5 mg total) by mouth 2 (two) times daily. 07/28/16  Yes Henreitta Leber, MD  B Complex Vitamins (VITAMIN B COMPLEX PO) Take 1 tablet by mouth daily.    Yes Historical  Provider, MD  bisoprolol (ZEBETA) 10 MG tablet Take 1 tablet (10 mg total) by mouth 2 (two) times daily. 07/29/16  Yes Gladstone Lighter, MD  busPIRone (BUSPAR) 10 MG tablet Take 10 mg by mouth 2 (two) times daily.  04/29/16  Yes Historical Provider, MD  CALCIUM PO Take 500 mg by mouth daily.    Yes Historical Provider, MD  Cholecalciferol (VITAMIN D PO) Take 5,000 mg by mouth daily.    Yes Historical Provider, MD  diltiazem (CARDIZEM CD) 180 MG 24 hr capsule Take 1 capsule (180 mg total) by mouth daily.  07/29/16  Yes Gladstone Lighter, MD  DULoxetine (CYMBALTA) 60 MG capsule Take 120 mg by mouth daily.   Yes Historical Provider, MD  Multiple Vitamin (MULTIVITAMIN) tablet Take 1 tablet by mouth daily.   Yes Historical Provider, MD  NONFORMULARY OR COMPOUNDED [place]:  Antiinflammatory Cream - Diclofenac 3%, Baclofen 2%, Cyclobenzaprine 2%, Lidocaine 2%, apply 1-2 grams to affected are 3-4 times daily. 12/28/15  Yes Trula Slade, DPM  rosuvastatin (CRESTOR) 20 MG tablet TAKE 1 TABLET BY MOUTH DAILY 08/03/16  Yes [name], FNP  benzonatate (TESSALON) 100 MG capsule Take 1 capsule (100 mg total) by mouth 2 (two) times daily as needed for cough. Patient not taking: Reported on 08/10/2016 06/24/16   [name], FNP    Allergies Prednisone; Hydrocodone-acetaminophen; Adhesive [tape]; and Latex  Family History  Problem Relation Age of Onset  . Cancer Sister 49    ovarian  . Cancer Father 53    colon  . Heart disease Father   . Heart attack Father 20    died from MI  . Hyperlipidemia Father   . Hypertension Father   . Cancer Mother     leukemia  . Breast cancer Paternal Aunt     58's    Social History Social History  Substance Use Topics  . Smoking status: Former Smoker    Packs/day: 0.25    Years: 10.00    Types: Cigarettes  . Smokeless tobacco: Never Used     Comment: smoked for 10 years  . Alcohol use Yes     Comment: social, occasional    Review of Systems  Constitutional: Negative for fever. Eyes: Negative for visual changes. ENT: Negative for sore throat. Neck: No neck pain  Cardiovascular: + chest pain Respiratory: + shortness of breath. Gastrointestinal: Negative for abdominal pain, vomiting or diarrhea. Genitourinary: Negative for dysuria. Musculoskeletal: Negative for back pain. Skin: Negative for rash. Neurological: Negative for headaches, weakness or numbness. Psych: No SI or  HI  ____________________________________________   PHYSICAL EXAM:  VITAL SIGNS: ED Triage Vitals  Enc Vitals Group     BP 08/10/16 1655 (!) 148/105     Pulse Rate 08/10/16 1655 (!) 136     Resp 08/10/16 1655 18     Temp 08/10/16 1655 97.9 F (36.6 C)     Temp Source 08/10/16 1655 Oral     SpO2 08/10/16 1655 96 %     Weight 08/10/16 1656 280 lb (127 kg)     Height 08/10/16 1656 5\' 5"  (1.651 m)     Head Circumference --      Peak Flow --      Pain Score 08/10/16 1656 8     Pain Loc --      Pain Edu? --      Excl. in [place]? --     Constitutional: Alert and oriented. Well appearing and in no  apparent distress. HEENT:      Head: Normocephalic and atraumatic.         Eyes: Conjunctivae are normal. Sclera is non-icteric. EOMI. PERRL      Mouth/Throat: Mucous membranes are moist.       Neck: Supple with no signs of meningismus. Cardiovascular: Irregularly irregular rhythm with tachycardic rate. No murmurs, gallops, or rubs. 2+ symmetrical distal pulses are present in all extremities. No JVD. Respiratory: Normal respiratory effort. Lungs are clear to auscultation bilaterally. No wheezes, crackles, or rhonchi.  Gastrointestinal: Soft, non tender, and non distended with positive bowel sounds. No rebound or guarding. Musculoskeletal: Nontender with normal range of motion in all extremities. No edema, cyanosis, or erythema of extremities. Neurologic: Normal speech and language. Face is symmetric. Moving all extremities. No gross focal neurologic deficits are appreciated. Skin: Skin is warm, dry and intact. No rash noted. Psychiatric: Mood and affect are normal. Speech and behavior are normal.  ____________________________________________   LABS (all labs ordered are listed, but only abnormal results are displayed)  Labs Reviewed  BASIC METABOLIC PANEL - Abnormal; Notable for the following:       Result Value   Calcium 8.6 (*)    All other components within normal limits   PROTIME-INR - Abnormal; Notable for the following:    Prothrombin Time 15.7 (*)    All other components within normal limits  BRAIN NATRIURETIC PEPTIDE - Abnormal; Notable for the following:    B Natriuretic Peptide 278.0 (*)    All other components within normal limits  CBC  TROPONIN I  MAGNESIUM   ____________________________________________  EKG  ED ECG REPORT I, Rudene Re, the attending physician, personally viewed and interpreted this ECG.  Atrial fibrillation, rate of 123, normal QRS and QTc intervals, normal axis, no ST elevations or depressions. ____________________________________________  RADIOLOGY  CXR: Mild interstitial prominence and very small pleural effusions. This may represent a degree of congestive heart failure. No confluent consolidation. ____________________________________________   PROCEDURES  Procedure(s) performed: None Procedures Critical Care performed:  None ____________________________________________   INITIAL IMPRESSION / ASSESSMENT AND PLAN / ED COURSE  66 y.o. female the history of recently diagnosed atrial fibrillation currently on Cardizem and Eliquis, COPD, hypertension and hyperlipidemia who presents for evaluation of chest pain and shortness of breath. Patient found to be in A. fib with RVR with ventricular rate between 110 and 130s, normotensive, normal work of breathing and clear lungs. EKG with no dizziness of ischemia. First troponin is negative. We'll give a full dose of aspirin. We'll give IV Cardizem to slow the rate, IV magnesium  Clinical Course as of Aug 10 2004  Wed Aug 10, 2016  1942 Patient is really not well controlled on home meds. BNP is elevated at 278, chest x-ray concerning for pulmonary edema. Patient was given 20 mg of IV Cardizem and 2 g of magnesium with improvement of her rate. We'll start her on Lasix 20 mg IV. We'll discuss with the hospitalist for admission for better rate control and diuresis  [CV]     Clinical Course User Index [CV] Rudene Re, MD    Pertinent labs & imaging results that were available during my care of the patient were reviewed by me and considered in my medical decision making (see chart for details).    ____________________________________________   FINAL CLINICAL IMPRESSION(S) / ED DIAGNOSES  Final diagnoses:  Atrial fibrillation with RVR ([place])  Acute pulmonary edema (HCC)  Chest pain, unspecified type  NEW MEDICATIONS STARTED DURING THIS VISIT:  New Prescriptions   No medications on file     Note:  This document was prepared using Dragon voice recognition software and may include unintentional dictation errors.    Rudene Re, MD 08/10/16 2006

## 2016-08-10 NOTE — H&P (Signed)
History and Physical   [facility] - [place] @ [facility] Admission History and Physical [employer], D.O.    Patient Name: Amber Barnes MR#: [id number] Date of Birth: [birth year] Date of Admission: 08/10/2016  Referring MD/NP/PA: Dr. Alfred Levins Primary Care Physician: [name], FNP Outpatient Specialists: Dr. [name]  Patient coming from: Home  Chief Complaint: Chest pain  HPI: Amber Barnes is a 66 y.o. female with a known history of recently diagnosed atrial fibrillation currently on Cardizem and Eliquis, COPD, hypertension and hyperlipidemia was in a usual state of health until one week ago when she describes progressively worsening shortness of breath, worse with exertion and better with rest although she was having episodes of dyspnea at rest. Patient became concerned because 2 days ago she describes developing gradual onset of chest pain described as tightness which is midsternal, nonradiating. Pain is not associated with palpitations, diaphoresis, nausea, anxiety, shortness of breath or lightheadedness.  Of note she was admitted to this hospital on 07/27/2016 with atrial fibrillation with RVR and was started on Cardizem and Zebeta as well as Eliquis.   Otherwise there has been no change in status. Patient has been taking medication as prescribed and there has been no recent change in medication or diet.  She states that she intentionally lost 35 pounds last year with diet and exercise  Patient denies fevers/chills, weakness, dizziness, chest pain, shortness of breath, N/V/C/D, abdominal pain, dysuria/frequency, changes in mental status.   ED Course: Patient rec'd aspirin 324mg , Cardizem 20mg , Lasix 20mg , Magnesium  Review of Systems:  CONSTITUTIONAL: No fever/chills, fatigue, weakness, weight gain/loss, headache. EYES: No blurry or double vision. ENT: No tinnitus, postnasal drip, redness or soreness of the oropharynx. RESPIRATORY: No cough,  wheeze.  No  hemoptysis. Positive dyspnea CARDIOVASCULAR: Positive chest pain, negative palpitations, syncope, orthopnea. No lower extremity edema.  GASTROINTESTINAL: No nausea, vomiting, abdominal pain, diarrhea, constipation.  No hematemesis, melena or hematochezia. GENITOURINARY: No dysuria, frequency, hematuria. ENDOCRINE: No polyuria or nocturia. No heat or cold intolerance. HEMATOLOGY: No anemia, bruising, bleeding. INTEGUMENTARY: No rashes, ulcers, lesions. MUSCULOSKELETAL: No arthritis, gout, dyspnea. NEUROLOGIC: No numbness, tingling, ataxia, seizure-type activity, weakness. PSYCHIATRIC: No anxiety, depression, insomnia.   Past Medical History:  Diagnosis Date  . Achilles tendinitis   . Acute medial meniscal tear   . Allergy   . Anxiety   . Arthritis   . Atrial fibrillation ([place])    a. new onset 07/2016; b. CHADS2VASc --> 2 (HTN, female); c. eliquis  . Chronic lower back pain   . COPD (chronic obstructive pulmonary disease) ([place])   . Depression   . Fibrocystic breast disease   . GERD (gastroesophageal reflux disease)   . Hyperlipidemia   . Hypertension   . Insomnia   . Lipoma   . Osteoarthritis    left knee  . Osteopenia   . Tendonitis   . Thyroid disease   . Vitamin D deficiency     Past Surgical History:  Procedure Laterality Date  . ABDOMINAL HYSTERECTOMY     ovaries left  . ACHILLES TENDON SURGERY    . BACK SURGERY    . COLONOSCOPY    . ESOPHAGOGASTRODUODENOSCOPY    . THYROIDECTOMY     thyroid lobectomy secondary to a benign nodule  . TOTAL THYROIDECTOMY       reports that she has quit smoking. Her smoking use included Cigarettes. She has a 2.50 pack-year smoking history. She has never used smokeless tobacco. She reports that she drinks alcohol. She reports  that she does not use drugs.  Allergies  Allergen Reactions  . Prednisone Other (See Comments)    Agitation, hyperactivity, polyphagia  . Hydrocodone-Acetaminophen Other (See Comments)    Hallucinations and  combative  . Adhesive [Tape] Itching and Rash  . Latex Itching and Rash    use paper tape    Family History  Problem Relation Age of Onset  . Cancer Sister 10    ovarian  . Cancer Father 47    colon  . Heart disease Father   . Heart attack Father 46    died from MI  . Hyperlipidemia Father   . Hypertension Father   . Cancer Mother     leukemia  . Breast cancer Paternal Aunt     56's   Family history has been reviewed and confirmed with patient.   Prior to Admission medications   Medication Sig Start Date End Date Taking? Authorizing Provider  albuterol (PROVENTIL HFA) 108 (90 Base) MCG/ACT inhaler Inhale 2 puffs into the lungs every 6 (six) hours as needed for wheezing or shortness of breath. 06/21/16  Yes [name], FNP  apixaban (ELIQUIS) 5 MG TABS tablet Take 1 tablet (5 mg total) by mouth 2 (two) times daily. 07/28/16  Yes Henreitta Leber, MD  B Complex Vitamins (VITAMIN B COMPLEX PO) Take 1 tablet by mouth daily.    Yes Historical Provider, MD  bisoprolol (ZEBETA) 10 MG tablet Take 1 tablet (10 mg total) by mouth 2 (two) times daily. 07/29/16  Yes Gladstone Lighter, MD  busPIRone (BUSPAR) 10 MG tablet Take 10 mg by mouth 2 (two) times daily.  04/29/16  Yes Historical Provider, MD  CALCIUM PO Take 500 mg by mouth daily.    Yes Historical Provider, MD  Cholecalciferol (VITAMIN D PO) Take 5,000 mg by mouth daily.    Yes Historical Provider, MD  diltiazem (CARDIZEM CD) 180 MG 24 hr capsule Take 1 capsule (180 mg total) by mouth daily. 07/29/16  Yes Gladstone Lighter, MD  DULoxetine (CYMBALTA) 60 MG capsule Take 120 mg by mouth daily.   Yes Historical Provider, MD  Multiple Vitamin (MULTIVITAMIN) tablet Take 1 tablet by mouth daily.   Yes Historical Provider, MD  NONFORMULARY OR COMPOUNDED [place]:  Antiinflammatory Cream - Diclofenac 3%, Baclofen 2%, Cyclobenzaprine 2%, Lidocaine 2%, apply 1-2 grams to affected are 3-4 times daily. 12/28/15  Yes Trula Slade, DPM  rosuvastatin (CRESTOR) 20 MG tablet TAKE 1 TABLET BY MOUTH DAILY 08/03/16  Yes [name], FNP  benzonatate (TESSALON) 100 MG capsule Take 1 capsule (100 mg total) by mouth 2 (two) times daily as needed for cough. Patient not taking: Reported on 08/10/2016 06/24/16   [name], FNP    Physical Exam: Vitals:   08/10/16 1930 08/10/16 2000 08/10/16 2101 08/10/16 2155  BP: (!) 143/101 (!) 137/103 135/79 120/81  Pulse: (!) 103  94 98  Resp: 18 (!) 21 17 12   Temp:      TempSrc:      SpO2: 97% 96% 96% 96%  Weight:      Height:        GENERAL: 66 y.o.-year-old female patient, well-developed, well-nourished lying in the bed in no acute distress.  Pleasant and cooperative.   HEENT: Head atraumatic, normocephalic. Pupils equal, round, reactive to light and accommodation. No scleral icterus. Extraocular muscles intact. Nares are patent. Oropharynx is clear. Mucus membranes moist. NECK: Supple, full range of motion. No JVD, no bruit heard.  No thyroid enlargement, no tenderness, no cervical lymphadenopathy. CHEST: Normal breath sounds bilaterally. No wheezing, rales, rhonchi or crackles. No use of accessory muscles of respiration.  No reproducible chest wall tenderness.  CARDIOVASCULAR: Irregularly irregular, S1, S2 normal. No murmurs, rubs, or gallops. Cap refill <2 seconds. Pulses intact distally.  ABDOMEN: Soft, nondistended, nontender. No rebound, guarding, rigidity. Normoactive bowel sounds present in all four quadrants. No organomegaly or mass. EXTREMITIES: Mild nonpitting pedal edema, no cyanosis, or clubbing. No calf tenderness or Homan's sign.  NEUROLOGIC: The patient is alert and oriented x 3. Cranial nerves II through XII are grossly intact with no focal sensorimotor deficit. Muscle strength 5/5 in all extremities. Sensation intact. Gait not checked. PSYCHIATRIC:  Normal affect, mood, thought content. SKIN: Warm, dry, and intact without obvious rash, lesion, or  ulcer.    Labs on Admission:  CBC:  Recent Labs Lab 08/10/16 1700  WBC 10.0  HGB 13.0  HCT 37.4  MCV 86.5  PLT 123456   Basic Metabolic Panel:  Recent Labs Lab 08/10/16 1700  NA 139  K 4.4  CL 108  CO2 23  GLUCOSE 94  BUN 16  CREATININE 0.86  CALCIUM 8.6*  MG 2.0   GFR: Estimated Creatinine Clearance: 89.8 mL/min (by C-G formula based on SCr of 0.86 mg/dL). Liver Function Tests: No results for input(s): AST, ALT, ALKPHOS, BILITOT, PROT, ALBUMIN in the last 168 hours. No results for input(s): LIPASE, AMYLASE in the last 168 hours. No results for input(s): AMMONIA in the last 168 hours. Coagulation Profile:  Recent Labs Lab 08/10/16 1700  INR 1.24   Cardiac Enzymes:  Recent Labs Lab 08/10/16 1700  TROPONINI <0.03   BNP (last 3 results) No results for input(s): PROBNP in the last 8760 hours. HbA1C: No results for input(s): HGBA1C in the last 72 hours. CBG: No results for input(s): GLUCAP in the last 168 hours. Lipid Profile: No results for input(s): CHOL, HDL, LDLCALC, TRIG, CHOLHDL, LDLDIRECT in the last 72 hours. Thyroid Function Tests: No results for input(s): TSH, T4TOTAL, FREET4, T3FREE, THYROIDAB in the last 72 hours. Anemia Panel: No results for input(s): VITAMINB12, FOLATE, FERRITIN, TIBC, IRON, RETICCTPCT in the last 72 hours. Urine analysis:    Component Value Date/Time   COLORURINE Straw 10/10/2014 2137   APPEARANCEUR Clear 10/10/2014 2137   LABSPEC 1.006 10/10/2014 2137   PHURINE 6.0 10/10/2014 2137   GLUCOSEU Negative 10/10/2014 2137   HGBUR 1+ 10/10/2014 2137   BILIRUBINUR neg 05/17/2016 1408   BILIRUBINUR Negative 10/10/2014 2137   KETONESUR Negative 10/10/2014 2137   PROTEINUR neg 05/17/2016 1408   PROTEINUR Negative 10/10/2014 2137   UROBILINOGEN 0.2 05/17/2016 1408   NITRITE neg 05/17/2016 1408   NITRITE Negative 10/10/2014 2137   LEUKOCYTESUR Negative 05/17/2016 1408   LEUKOCYTESUR Negative 10/10/2014 2137   Sepsis  Labs: @LABRCNTIP (procalcitonin:4,lacticidven:4) )No results found for this or any previous visit (from the past 240 hour(s)).   Radiological Exams on Admission: Dg Chest 2 View  Result Date: 08/10/2016 CLINICAL DATA:  Left-sided chest pain radiating to the back and upper extremity. Dyspnea, onset today. EXAM: CHEST  2 VIEW COMPARISON:  07/27/2016 FINDINGS: There is mild interstitial prominence, particularly in the basilar periphery. No confluent airspace consolidation. There are very small pleural effusions. There is mild cardiomegaly. Hilar and mediastinal contours are unremarkable and unchanged. IMPRESSION: Mild interstitial prominence and very small pleural effusions. This may represent a degree of congestive heart failure. No confluent consolidation. Electronically Signed   By: Andreas Newport  M.D.   On: 08/10/2016 18:13    EKG: Atrial fibrillation at 123  bpm with normal axis and nonspecific ST-T wave changes.   ECHO done 07/27/16 Study Conclusions:  - Left ventricle: The cavity size was normal. There was mild   concentric hypertrophy. Systolic function was normal. The   estimated ejection fraction was in the range of 55% to 60%. Wall   motion was normal; there were no regional wall motion   abnormalities. The study is not technically sufficient to allow   evaluation of LV diastolic function. - Mitral valve: There was mild regurgitation. - Left atrium: The atrium was mildly dilated. - Right ventricle: Systolic function was normal. - Pulmonary arteries: Systolic pressure could not be accurately   estimated.  Impressions:  - Rhythm is atrial fibrillation with RVR.  Assessment/Plan   This is a 66 y.o. female with a history of atrial fibrillation currently on Cardizem and Eliquis, COPD, hypertension and hyperlipidemia now being admitted with:  1. Atrial fibrillation with RVR, chest pain - Admit inpatient with telemetry - Rate control - Trend trops, check TSH, lipids -  Increase Cardizem to 240 daily - Continue Eliquis - Cardiology consultation has been requested  2. Pulmonary edema like 2/2 #1 - Rec'd IV Lasix in ED - Echo done 07/27/16 - Duonebs PRN  3. H/o HTN - Continue Zebeta  4. H/o HLD - Continue Crestor  5. H/o depression / anxiety - Continue Buspar, Cymbalta  Admission status: Inpatient, telemetry IV Fluids: HL Diet/Nutrition: HH Consults called: Cardio  DVT Px: Eliquis, SCDs and early ambulation. Code Status: Full Code  Disposition Plan: To home in 1-2 days   All the records are reviewed and case discussed with ED provider. Management plans discussed with the patient and/or family who express understanding and agree with plan of care.  Sirenity Shew D.O. on 08/10/2016 at 10:10 PM Between 7am to 6pm - Pager - [phone] After 6pm go to [email or web address] - Proofreader [facility] [place] [facility] Office [phone] CC: Primary care physician; [name], [place]   08/10/2016, 10:10 PM

## 2016-08-11 DIAGNOSIS — Z79899 Other long term (current) drug therapy: Secondary | ICD-10-CM | POA: Diagnosis not present

## 2016-08-11 DIAGNOSIS — Z90710 Acquired absence of both cervix and uterus: Secondary | ICD-10-CM | POA: Diagnosis not present

## 2016-08-11 DIAGNOSIS — Z7901 Long term (current) use of anticoagulants: Secondary | ICD-10-CM | POA: Diagnosis not present

## 2016-08-11 DIAGNOSIS — I481 Persistent atrial fibrillation: Principal | ICD-10-CM

## 2016-08-11 DIAGNOSIS — I119 Hypertensive heart disease without heart failure: Secondary | ICD-10-CM | POA: Diagnosis not present

## 2016-08-11 DIAGNOSIS — Z91040 Latex allergy status: Secondary | ICD-10-CM | POA: Diagnosis not present

## 2016-08-11 DIAGNOSIS — F329 Major depressive disorder, single episode, unspecified: Secondary | ICD-10-CM | POA: Diagnosis not present

## 2016-08-11 DIAGNOSIS — Z888 Allergy status to other drugs, medicaments and biological substances status: Secondary | ICD-10-CM | POA: Diagnosis not present

## 2016-08-11 DIAGNOSIS — J810 Acute pulmonary edema: Secondary | ICD-10-CM

## 2016-08-11 DIAGNOSIS — E890 Postprocedural hypothyroidism: Secondary | ICD-10-CM | POA: Diagnosis not present

## 2016-08-11 DIAGNOSIS — J449 Chronic obstructive pulmonary disease, unspecified: Secondary | ICD-10-CM | POA: Diagnosis not present

## 2016-08-11 DIAGNOSIS — Z6841 Body Mass Index (BMI) 40.0 and over, adult: Secondary | ICD-10-CM | POA: Diagnosis not present

## 2016-08-11 DIAGNOSIS — N6019 Diffuse cystic mastopathy of unspecified breast: Secondary | ICD-10-CM | POA: Diagnosis not present

## 2016-08-11 DIAGNOSIS — R079 Chest pain, unspecified: Secondary | ICD-10-CM | POA: Diagnosis not present

## 2016-08-11 DIAGNOSIS — F419 Anxiety disorder, unspecified: Secondary | ICD-10-CM | POA: Diagnosis not present

## 2016-08-11 DIAGNOSIS — E782 Mixed hyperlipidemia: Secondary | ICD-10-CM | POA: Diagnosis not present

## 2016-08-11 DIAGNOSIS — I4819 Other persistent atrial fibrillation: Secondary | ICD-10-CM | POA: Diagnosis present

## 2016-08-11 DIAGNOSIS — K219 Gastro-esophageal reflux disease without esophagitis: Secondary | ICD-10-CM | POA: Diagnosis not present

## 2016-08-11 DIAGNOSIS — Z885 Allergy status to narcotic agent status: Secondary | ICD-10-CM | POA: Diagnosis not present

## 2016-08-11 LAB — CBC
HCT: 35.9 % (ref 35.0–47.0)
Hemoglobin: 12.4 g/dL (ref 12.0–16.0)
MCH: 29.7 pg (ref 26.0–34.0)
MCHC: 34.7 g/dL (ref 32.0–36.0)
MCV: 85.7 fL (ref 80.0–100.0)
Platelets: 181 10*3/uL (ref 150–440)
RBC: 4.19 MIL/uL (ref 3.80–5.20)
RDW: 14.6 % — ABNORMAL HIGH (ref 11.5–14.5)
WBC: 7.7 10*3/uL (ref 3.6–11.0)

## 2016-08-11 LAB — BASIC METABOLIC PANEL
Anion gap: 8 (ref 5–15)
BUN: 18 mg/dL (ref 6–20)
CO2: 25 mmol/L (ref 22–32)
Calcium: 8.4 mg/dL — ABNORMAL LOW (ref 8.9–10.3)
Chloride: 106 mmol/L (ref 101–111)
Creatinine, Ser: 0.72 mg/dL (ref 0.44–1.00)
GFR calc Af Amer: >60 mL/min (ref >60)
GFR calc non Af Amer: >60 mL/min (ref >60)
Glucose, Bld: 91 mg/dL (ref 65–99)
Potassium: 3.9 mmol/L (ref 3.5–5.1)
Sodium: 139 mmol/L (ref 135–145)

## 2016-08-11 LAB — TROPONIN I
Troponin I: <0.03 ng/mL (ref <0.03)
Troponin I: <0.03 ng/mL (ref <0.03)
Troponin I: <0.03 ng/mL (ref <0.03)

## 2016-08-11 LAB — MAGNESIUM: Magnesium: 2.2 mg/dL (ref 1.7–2.4)

## 2016-08-11 LAB — LIPID PANEL
Cholesterol: 126 mg/dL (ref 0–200)
HDL: 45 mg/dL (ref >40)
LDL Cholesterol: 65 mg/dL (ref 0–99)
Total CHOL/HDL Ratio: 2.8 RATIO
Triglycerides: 82 mg/dL (ref <150)
VLDL: 16 mg/dL (ref 0–40)

## 2016-08-11 LAB — PHOSPHORUS: Phosphorus: 4.7 mg/dL — ABNORMAL HIGH (ref 2.5–4.6)

## 2016-08-11 MED ORDER — ALBUTEROL SULFATE (2.5 MG/3ML) 0.083% IN NEBU: 2.5000 mg | INHALATION_SOLUTION | Freq: Four times a day (QID) | RESPIRATORY_TRACT | Status: DC | PRN

## 2016-08-11 MED ORDER — BUSPIRONE HCL 10 MG PO TABS
10.0000 mg | ORAL_TABLET | Freq: Two times a day (BID) | ORAL | Status: DC
  Administered 2016-08-11 (×2): 10 mg via ORAL
  Filled 2016-08-11 (×2): qty 1

## 2016-08-11 MED ORDER — SODIUM CHLORIDE 0.9 % IV SOLN: 250.0000 mL | INTRAVENOUS | Status: DC | PRN

## 2016-08-11 MED ORDER — SODIUM CHLORIDE 0.9% FLUSH: 3.0000 mL | Freq: Two times a day (BID) | INTRAVENOUS | Status: DC

## 2016-08-11 MED ORDER — ONDANSETRON HCL 4 MG/2ML IJ SOLN: 4.0000 mg | Freq: Four times a day (QID) | INTRAMUSCULAR | Status: DC | PRN

## 2016-08-11 MED ORDER — POTASSIUM CHLORIDE ER 10 MEQ PO TBCR: 10.0000 meq | EXTENDED_RELEASE_TABLET | Freq: Two times a day (BID) | ORAL | 0 refills | Status: DC

## 2016-08-11 MED ORDER — APIXABAN 5 MG PO TABS
5.0000 mg | ORAL_TABLET | Freq: Two times a day (BID) | ORAL | Status: DC
  Administered 2016-08-11 (×2): 5 mg via ORAL
  Filled 2016-08-11 (×2): qty 1

## 2016-08-11 MED ORDER — IPRATROPIUM BROMIDE 0.02 % IN SOLN: 0.5000 mg | Freq: Four times a day (QID) | RESPIRATORY_TRACT | Status: DC | PRN

## 2016-08-11 MED ORDER — ONDANSETRON HCL 4 MG PO TABS: 4.0000 mg | ORAL_TABLET | Freq: Four times a day (QID) | ORAL | Status: DC | PRN

## 2016-08-11 MED ORDER — MAGNESIUM CITRATE PO SOLN: 1.0000 | Freq: Once | ORAL | Status: DC | PRN

## 2016-08-11 MED ORDER — SENNOSIDES-DOCUSATE SODIUM 8.6-50 MG PO TABS: 1.0000 | ORAL_TABLET | Freq: Every evening | ORAL | Status: DC | PRN

## 2016-08-11 MED ORDER — POTASSIUM CHLORIDE CRYS ER 20 MEQ PO TBCR
30.0000 meq | EXTENDED_RELEASE_TABLET | Freq: Once | ORAL | Status: AC
  Administered 2016-08-11: 30 meq via ORAL
  Filled 2016-08-11: qty 1

## 2016-08-11 MED ORDER — SODIUM CHLORIDE 0.9% FLUSH
3.0000 mL | Freq: Two times a day (BID) | INTRAVENOUS | Status: DC
  Administered 2016-08-11 (×2): 3 mL via INTRAVENOUS

## 2016-08-11 MED ORDER — VITAMIN D 1000 UNITS PO TABS
5000.0000 [IU] | ORAL_TABLET | Freq: Every day | ORAL | Status: DC
  Administered 2016-08-11: 5000 [IU] via ORAL
  Filled 2016-08-11: qty 5

## 2016-08-11 MED ORDER — CALCIUM CARBONATE ANTACID 500 MG PO CHEW
500.0000 mg | CHEWABLE_TABLET | Freq: Every day | ORAL | Status: DC
  Administered 2016-08-11: 500 mg via ORAL
  Filled 2016-08-11: qty 1

## 2016-08-11 MED ORDER — ROSUVASTATIN CALCIUM 20 MG PO TABS
20.0000 mg | ORAL_TABLET | Freq: Every day | ORAL | Status: DC
  Administered 2016-08-11: 20 mg via ORAL
  Filled 2016-08-11: qty 1

## 2016-08-11 MED ORDER — DILTIAZEM HCL 30 MG PO TABS: 30.0000 mg | ORAL_TABLET | ORAL | 0 refills | Status: DC | PRN

## 2016-08-11 MED ORDER — DILTIAZEM HCL ER COATED BEADS 240 MG PO CP24: 240.0000 mg | ORAL_CAPSULE | Freq: Every day | ORAL | 0 refills | Status: DC

## 2016-08-11 MED ORDER — BISACODYL 5 MG PO TBEC: 5.0000 mg | DELAYED_RELEASE_TABLET | Freq: Every day | ORAL | Status: DC | PRN

## 2016-08-11 MED ORDER — FUROSEMIDE 20 MG PO TABS: 20.0000 mg | ORAL_TABLET | Freq: Two times a day (BID) | ORAL | 0 refills | Status: DC

## 2016-08-11 MED ORDER — BISOPROLOL FUMARATE 5 MG PO TABS
10.0000 mg | ORAL_TABLET | Freq: Two times a day (BID) | ORAL | Status: DC
  Administered 2016-08-11 (×2): 10 mg via ORAL
  Filled 2016-08-11 (×3): qty 1

## 2016-08-11 MED ORDER — DILTIAZEM HCL ER COATED BEADS 240 MG PO CP24
240.0000 mg | ORAL_CAPSULE | Freq: Every day | ORAL | Status: DC
  Administered 2016-08-11: 240 mg via ORAL
  Filled 2016-08-11: qty 1

## 2016-08-11 MED ORDER — FUROSEMIDE 10 MG/ML IJ SOLN
40.0000 mg | Freq: Once | INTRAMUSCULAR | Status: AC
Start: 2016-08-11 — End: 2016-08-11
  Administered 2016-08-11: 40 mg via INTRAVENOUS
  Filled 2016-08-11: qty 4

## 2016-08-11 MED ORDER — SODIUM CHLORIDE 0.9% FLUSH: 3.0000 mL | INTRAVENOUS | Status: DC | PRN

## 2016-08-11 MED ORDER — DULOXETINE HCL 60 MG PO CPEP
120.0000 mg | ORAL_CAPSULE | Freq: Every day | ORAL | Status: DC
  Administered 2016-08-11: 120 mg via ORAL
  Filled 2016-08-11: qty 2

## 2016-08-11 NOTE — Care Management CC44 (Signed)
Condition Code 44 Documentation Completed  Patient Details  Name: Yumi Trompeter MRN: [id number] Date of Birth: [birth year]   Condition Code 44 given:   yes Patient signature on Condition Code 44 notice:   yes Documentation of 2 MD's agreement:   yes as reported Code 44 added to claim:   yes    Katrina Stack, RN 08/11/2016, 11:07 AM

## 2016-08-11 NOTE — Discharge Instructions (Signed)
F/u with PCP in a week F/u with cardio- dr. [name] in 2 weeks

## 2016-08-11 NOTE — Care Management Obs Status (Signed)
[place] NOTIFICATION   Patient Details  Name: Amber Barnes MRN: [id number] Date of Birth: 12-Dec-1950   Medicare Observation Status Notification Given:  Yes    Katrina Stack, RN 08/11/2016, 12:02 PM

## 2016-08-11 NOTE — Discharge Summary (Signed)
[facility] NAME: Amber Barnes    MR#:  [id number]  DATE OF BIRTH:  [birth year]  DATE OF ADMISSION:  08/10/2016 ADMITTING PHYSICIAN: Harvie Bridge, DO  DATE OF DISCHARGE: 08/11/16 PRIMARY CARE PHYSICIAN: [name], FNP    ADMISSION DIAGNOSIS:  Acute pulmonary edema ([place]) [J81.0] Atrial fibrillation with RVR (HCC) [I48.91] Chest pain, unspecified type [R07.9]  DISCHARGE DIAGNOSIS:  Active Problems:   Atrial fibrillation with RVR (HCC)   Atrial fibrillation (HCC)   Acute pulmonary edema ([place])   SECONDARY DIAGNOSIS:   Past Medical History:  Diagnosis Date  . Achilles tendinitis   . Acute medial meniscal tear   . Allergy   . Anxiety   . Arthritis   . Atrial fibrillation ([place])    a. new onset 07/2016; b. CHADS2VASc --> 2 (HTN, female); c. eliquis  . Chronic lower back pain   . COPD (chronic obstructive pulmonary disease) ([place])   . Depression   . Fibrocystic breast disease   . GERD (gastroesophageal reflux disease)   . Hyperlipidemia   . Hypertension   . Insomnia   . Lipoma   . Osteoarthritis    left knee  . Osteopenia   . Tendonitis   . Thyroid disease   . Vitamin D deficiency     HOSPITAL COURSE:  HPI: Amber Barnes is a 66 y.o. female with a known history of recently diagnosed atrial fibrillation currently on Cardizem and Eliquis, COPD, hypertension and hyperlipidemia was in a usual state of health until one week ago when she describes progressively worsening shortness of breath, worse with exertion and better with rest although she was having episodes of dyspnea at rest. Patient became concerned because 2 days ago she describes developing gradual onset of chest pain described as tightness which is midsternal, nonradiating. Pain is not associated with palpitations, diaphoresis, nausea, anxiety, shortness of breath or lightheadedness.  Of note she was admitted to this hospital on 07/27/2016  with atrial fibrillation with RVR and was started on Cardizem and Zebeta as well as Eliquis.   Otherwise there has been no change in status. Patient has been taking medication as prescribed and there has been no recent change in medication or diet.  She states that she intentionally lost 35 pounds last year with diet and exercise  Patient denies fevers/chills, weakness, dizziness, chest pain, shortness of breath, N/V/C/D, abdominal pain, dysuria/frequency, changes in mental status.   ED Course: Patient rec'd aspirin 324mg , Cardizem 20mg , Lasix 20mg , Magnesium  Hospital course  1. Atrial fibrillation with RVR, chest pain - Admit inpatient with telemetry - Rate control - normal TSH - Increased Cardizem to 240 daily, continue the same. Patient to take Cardizem 30 mg by mouth as needed whenever she feels like her heart is racing up - Continue Eliquis - patient was evaluated by cardiology Dr. [name] no other recommendations at this time. Nocardia interventions are needed at this time.okay to discharge patient from cardiology standpoint -rule out acute MI with 3 negative troponins -Patient recently had a nuclear stress test on 07/29/2016 which was low risk study  2. Pulmonary edema like 2/2 #1 - Rec'd IV Lasix in ED -1 more dose of IV Lasix was given during the hospital course, clinical situation significantly improved Discharge patient with Lasix 20 mg by mouth twice a day for 3 days and then 20 mg once daily. Patient has to take 10 mEq of potassium chloride whenever she takes Lasix tablet - Echo  done 07/27/16-revealed 55-60% of ejection fraction - Duonebs PRN  3. H/o HTN - Continue Zebeta  4. H/o HLD - Continue Crestor  5. H/o depression / anxiety - Continue Buspar, Cymbalta  Patient was seen by physical therapy no PT recommendations  DISCHARGE CONDITIONS:  stable  CONSULTS OBTAINED:  Treatment Team:  Minna Merritts, MD   PROCEDURES  None   DRUG ALLERGIES:    Allergies  Allergen Reactions  . Prednisone Other (See Comments)    Agitation, hyperactivity, polyphagia  . Hydrocodone-Acetaminophen Other (See Comments)    Hallucinations and combative  . Adhesive [Tape] Itching and Rash  . Latex Itching and Rash    use paper tape    DISCHARGE MEDICATIONS:   Current Discharge Medication List    START taking these medications   Details  diltiazem (CARDIZEM) 30 MG tablet Take 1 tablet (30 mg total) by mouth as needed. Cardizem 30 mg by mouth as needed for palpitations and tachycardia with heart rate greater than 100 Qty: 30 tablet, Refills: 0    furosemide (LASIX) 20 MG tablet Take 1 tablet (20 mg total) by mouth 2 (two) times daily. Patient is to take Lasix 20 mg by mouth twice a day For 3 days until  February 4 Followed by Lasix 20 mg once a day Qty: 60 tablet, Refills: 0    potassium chloride (K-DUR) 10 MEQ tablet Take 1 tablet (10 mEq total) by mouth 2 (two) times daily. Potassium chloride 10 mEq by mouth twice a day for 3 days until February 4 followed by once daily while patient is on Lasix Qty: 60 tablet, Refills: 0    senna-docusate (SENOKOT-S) 8.6-50 MG tablet Take 1 tablet by mouth at bedtime as needed for mild constipation.      CONTINUE these medications which have CHANGED   Details  diltiazem (CARDIZEM CD) 240 MG 24 hr capsule Take 1 capsule (240 mg total) by mouth daily. Qty: 30 capsule, Refills: 0      CONTINUE these medications which have NOT CHANGED   Details  albuterol (PROVENTIL HFA) 108 (90 Base) MCG/ACT inhaler Inhale 2 puffs into the lungs every 6 (six) hours as needed for wheezing or shortness of breath. Qty: 1 Inhaler, Refills: 1   Associated Diagnoses: Influenza    apixaban (ELIQUIS) 5 MG TABS tablet Take 1 tablet (5 mg total) by mouth 2 (two) times daily. Qty: 60 tablet, Refills: 1    B Complex Vitamins (VITAMIN B COMPLEX PO) Take 1 tablet by mouth daily.     bisoprolol (ZEBETA) 10 MG tablet Take 1 tablet  (10 mg total) by mouth 2 (two) times daily. Qty: 60 tablet, Refills: 1    busPIRone (BUSPAR) 10 MG tablet Take 10 mg by mouth 2 (two) times daily.     CALCIUM PO Take 500 mg by mouth daily.     Cholecalciferol (VITAMIN D PO) Take 5,000 mg by mouth daily.     DULoxetine (CYMBALTA) 60 MG capsule Take 120 mg by mouth daily.    Multiple Vitamin (MULTIVITAMIN) tablet Take 1 tablet by mouth daily.    NONFORMULARY OR COMPOUNDED ITEM [facility]:  Antiinflammatory Cream - Diclofenac 3%, Baclofen 2%, Cyclobenzaprine 2%, Lidocaine 2%, apply 1-2 grams to affected are 3-4 times daily. Qty: 120 each, Refills: 6    rosuvastatin (CRESTOR) 20 MG tablet TAKE 1 TABLET BY MOUTH DAILY Qty: 90 tablet, Refills: 0    benzonatate (TESSALON) 100 MG capsule Take 1 capsule (100 mg total) by mouth 2 (two)  times daily as needed for cough. Qty: 20 capsule, Refills: 0   Associated Diagnoses: Viral URI         DISCHARGE INSTRUCTIONS:  F/u with PCP in a week F/u with cardio- dr. [name] in 2 weeks   DIET:  Cardiac diet  DISCHARGE CONDITION:  Stable  ACTIVITY:  Activity as tolerated  OXYGEN:  Home Oxygen: No.   Oxygen Delivery: room air  DISCHARGE LOCATION:  home   If you experience worsening of your admission symptoms, develop shortness of breath, life threatening emergency, suicidal or homicidal thoughts you must seek medical attention immediately by calling 911 or calling your MD immediately  if symptoms less severe.  You Must read complete instructions/literature along with all the possible adverse reactions/side effects for all the Medicines you take and that have been prescribed to you. Take any new Medicines after you have completely understood and accpet all the possible adverse reactions/side effects.   Please note  You were cared for by a hospitalist during your hospital stay. If you have any questions about your discharge medications or the care you received while you were in the  hospital after you are discharged, you can call the unit and asked to speak with the hospitalist on call if the hospitalist that took care of you is not available. Once you are discharged, your primary care physician will handle any further medical issues. Please note that NO REFILLS for any discharge medications will be authorized once you are discharged, as it is imperative that you return to your primary care physician (or establish a relationship with a primary care physician if you do not have one) for your aftercare needs so that they can reassess your need for medications and monitor your lab values.     Today  Chief Complaint  Patient presents with  . Chest Pain   Patient is doing much better. Denies any chest pain or shortness of breath. Recently had nuclear stress test done on January 19 which was low risk study  ROS:  CONSTITUTIONAL: Denies fevers, chills. Denies any fatigue, weakness.  EYES: Denies blurry vision, double vision, eye pain. EARS, NOSE, THROAT: Denies tinnitus, ear pain, hearing loss. RESPIRATORY: Denies cough, wheeze, shortness of breath.  CARDIOVASCULAR: Denies chest pain, palpitations, edema.  GASTROINTESTINAL: Denies nausea, vomiting, diarrhea, abdominal pain. Denies bright red blood per rectum. GENITOURINARY: Denies dysuria, hematuria. ENDOCRINE: Denies nocturia or thyroid problems. HEMATOLOGIC AND LYMPHATIC: Denies easy bruising or bleeding. SKIN: Denies rash or lesion. MUSCULOSKELETAL: Denies pain in neck, back, shoulder, knees, hips or arthritic symptoms.  NEUROLOGIC: Denies paralysis, paresthesias.  PSYCHIATRIC: Denies anxiety or depressive symptoms.   VITAL SIGNS:  Blood pressure 140/88, pulse (!) 56, temperature 97.5 F (36.4 C), temperature source Oral, resp. rate 18, height 5\' 5"  (1.651 m), weight 127 kg (280 lb), SpO2 94 %.  I/O:    Intake/Output Summary (Last 24 hours) at 08/11/16 1503 Last data filed at 08/11/16 1400  Gross per 24 hour   Intake              533 ml  Output              750 ml  Net             -217 ml    PHYSICAL EXAMINATION:  GENERAL:  66 y.o.-year-old patient lying in the bed with no acute distress.  EYES: Pupils equal, round, reactive to light and accommodation. No scleral icterus. Extraocular muscles intact.  HEENT: Head atraumatic, normocephalic.  Oropharynx and nasopharynx clear.  NECK:  Supple, no jugular venous distention. No thyroid enlargement, no tenderness.  LUNGS: Normal breath sounds bilaterally, no wheezing, rales,rhonchi or crepitation. No use of accessory muscles of respiration.  CARDIOVASCULAR: S1, S2 normal. No murmurs, rubs, or gallops.  ABDOMEN: Soft, non-tender, non-distended. Bowel sounds present. No organomegaly or mass.  EXTREMITIES: No pedal edema, cyanosis, or clubbing.  NEUROLOGIC: Cranial nerves II through XII are intact. Muscle strength 5/5 in all extremities. Sensation intact. Gait not checked.  PSYCHIATRIC: The patient is alert and oriented x 3.  SKIN: No obvious rash, lesion, or ulcer.   DATA REVIEW:   CBC  Recent Labs Lab 08/11/16 0708  WBC 7.7  HGB 12.4  HCT 35.9  PLT 181    Chemistries   Recent Labs Lab 08/11/16 0708  NA 139  K 3.9  CL 106  CO2 25  GLUCOSE 91  BUN 18  CREATININE 0.72  CALCIUM 8.4*  MG 2.2    Cardiac Enzymes  Recent Labs Lab 08/11/16 1232  [place] <0.03    Microbiology Results  No results found for this or any previous visit.  RADIOLOGY:  Dg Chest 2 View  Result Date: 08/10/2016 CLINICAL DATA:  Left-sided chest pain radiating to the back and upper extremity. Dyspnea, onset today. EXAM: CHEST  2 VIEW COMPARISON:  07/27/2016 FINDINGS: There is mild interstitial prominence, particularly in the basilar periphery. No confluent airspace consolidation. There are very small pleural effusions. There is mild cardiomegaly. Hilar and mediastinal contours are unremarkable and unchanged. IMPRESSION: Mild interstitial prominence and  very small pleural effusions. This may represent a degree of congestive heart failure. No confluent consolidation. Electronically Signed   By: Andreas Newport M.D.   On: 08/10/2016 18:13    EKG:   Orders placed or performed during the hospital encounter of 08/10/16  . EKG 12-Lead  . EKG 12-Lead  . ED EKG within 10 minutes  . ED EKG within 10 minutes  . EKG 12-Lead      Management plans discussed with the patient, family and they are in agreement.  CODE STATUS:     Code Status Orders        Start     Ordered   08/11/16 0037  Full code  Continuous     08/11/16 0036    Code Status History    Date Active Date Inactive Code Status Order ID Comments User Context   07/27/2016  3:25 PM 07/29/2016  8:33 PM Full Code [id number]  Henreitta Leber, MD Inpatient      TOTAL TIME TAKING CARE OF THIS PATIENT: 45 minutes.   Note: This dictation was prepared with Dragon dictation along with smaller phrase technology. Any transcriptional errors that result from this process are unintentional.   @[facility] @  on 08/11/2016 at 3:03 PM  Between 7am to 6pm - Pager - [phone]  After 6pm go to [email or web address] - password [email or web address] [facility]  Office  [phone]  CC: Primary care physician; [name], FNP

## 2016-08-11 NOTE — Evaluation (Signed)
Physical Therapy Evaluation Patient Details Name: Amber Barnes MRN: [id number] DOB: 01-06-1951 Today's Date: 08/11/2016   History of Present Illness  Amber Barnes is a 66 y.o. female with a known history of recently diagnosed atrial fibrillation currently on Cardizem and Eliquis, COPD, hypertension and hyperlipidemia was in a usual state of health until one week ago when she describes progressively worsening shortness of breath, worse with exertion and better with rest although she was having episodes of dyspnea at rest. Patient became concerned because 2 days ago she describes developing gradual onset of chest pain described as tightness which is midsternal, nonradiating. Pain is not associated with palpitations, diaphoresis, nausea, anxiety, shortness of breath or lightheadedness.  Clinical Impression  Pt demonstrates independence with all bed mobility, transfers, and ambulation on this date. She is able to ambulate around hallways with therapist without assistive device. Speed is adequate for full community ambulation. Pt able to perform horizontal and vertical head turns without lateral gait deviation. Able to perform quick turns without LOB. Pt reports 6/10 DOE on BORG with ambulation but SaO2 remains at or above 96% on room air. No increase in chest pain with exertion but pt reports some bilateral chest "heaviness." No PT needs identified at this time. Order will be completed. Please enter new order if status or needs change.      Follow Up Recommendations No PT follow up    Equipment Recommendations  None recommended by PT    Recommendations for Other Services       Precautions / Restrictions Precautions Precautions: Fall Restrictions Weight Bearing Restrictions: No      Mobility  Bed Mobility Overal bed mobility: Modified Independent             General bed mobility comments: HOB minimally elevated and bed rails utilized. Good speed/sequencing  noted  Transfers Overall transfer level: Independent Equipment used: None             General transfer comment: Pt demonstrates good strength and stability with transfers. No safety concerns  Ambulation/Gait Ambulation/Gait assistance: Independent Ambulation Distance (Feet): 180 Feet Assistive device: None Gait Pattern/deviations: WFL(Within Functional Limits) Gait velocity: WFL   General Gait Details: Pt able to ambulate around hallways with therapist without assistive device. Speed is adequate for full community ambulation. Pt able to perform horizontal and vertical head turns without lateral gait deviation. Able to perform quick turns without LOB. Pt reports 6/10 DOE on BORG with ambulation but SaO2 remains at or above 96% on room air. No increase in chest pain but pt reports some bilateral chest "heaviness"  Stairs            Wheelchair Mobility    Modified Rankin (Stroke Patients Only)       Balance Overall balance assessment: Needs assistance Sitting-balance support: No upper extremity supported Sitting balance-Leahy Scale: Normal     Standing balance support: No upper extremity supported Standing balance-Leahy Scale: Good Standing balance comment: Negative [place]. Single leg balance approximately 5 seconds on each LE                             Pertinent Vitals/Pain Pain Assessment: 0-10 Pain Score: 10-Worst pain ever Pain Location: L heel, chronic L achilles pain from bone spur. Denies chest pain at this time    Home Living Family/patient expects to be discharged to:: Private residence Living Arrangements: Spouse/significant other Available Help at Discharge: Family Type of Home: Apartment Home Access: Level  entry     Home Layout: One level Home Equipment: [place] - 2 wheels (Otherwise no equipment)      Prior Function Level of Independence: Independent         Comments: Independent with ADLs/IADLs. Hasn't been driving since  A-fib diagnosed because medication has been making her feel a little dizzy     Hand Dominance   Dominant Hand: Right    Extremity/Trunk Assessment   Upper Extremity Assessment Upper Extremity Assessment: Overall WFL for tasks assessed    Lower Extremity Assessment Lower Extremity Assessment: Overall WFL for tasks assessed       Communication   Communication: No difficulties  Cognition Arousal/Alertness: Awake/alert Behavior During Therapy: WFL for tasks assessed/performed Overall Cognitive Status: Within Functional Limits for tasks assessed                      General Comments      Exercises     Assessment/Plan    PT Assessment Patent does not need any further PT services  PT Problem List            PT Treatment Interventions      PT Goals (Current goals can be found in the Care Plan section)  Acute Rehab PT Goals PT Goal Formulation: All assessment and education complete, DC therapy    Frequency     Barriers to discharge        Co-evaluation               End of Session Equipment Utilized During Treatment: Gait belt Activity Tolerance: Patient tolerated treatment well Patient left: in bed;with call bell/phone within reach      Functional Assessment Tool Used: clinical judgement Functional Limitation: Mobility: Walking and moving around Mobility: Walking and Moving Around Current Status [id number]): At least 1 percent but less than 20 percent impaired, limited or restricted Mobility: Walking and Moving Around Goal Status [phone]): At least 1 percent but less than 20 percent impaired, limited or restricted Mobility: Walking and Moving Around Discharge Status [phone]): At least 1 percent but less than 20 percent impaired, limited or restricted    Time: 0930-0949 PT Time Calculation (min) (ACUTE ONLY): 19 min   Charges:   PT Evaluation $PT Eval Low Complexity: 1 Procedure     PT G Codes:   PT G-Codes **NOT FOR INPATIENT  CLASS** Functional Assessment Tool Used: clinical judgement Functional Limitation: Mobility: Walking and moving around Mobility: Walking and Moving Around Current Status [id number]): At least 1 percent but less than 20 percent impaired, limited or restricted Mobility: Walking and Moving Around Goal Status [phone]): At least 1 percent but less than 20 percent impaired, limited or restricted Mobility: Walking and Moving Around Discharge Status [phone]): At least 1 percent but less than 20 percent impaired, limited or restricted   Phillips Grout PT, DPT   Witten Certain 08/11/2016, 10:04 AM

## 2016-08-11 NOTE — Care Management Important Message (Deleted)
Important Message  Patient Details  Name: Amber Barnes MRN: [id number] Date of Birth: 1950/08/23   Medicare Important Message Given:  Yes    Katrina Stack, RN 08/11/2016, 11:06 AM

## 2016-08-11 NOTE — Consult Note (Addendum)
Cardiology Consultation Note  Patient ID: Amber Barnes, MRN: [id number], DOB/AGE: 1950-11-27 66 y.o. Admit date: 08/10/2016   Date of Consult: 08/11/2016 Primary Physician: [name], FNP Primary Cardiologist: [name], tim  Chief Complaint: SOB, chest tightness Reason for Consult: atrial fib with RVR Physician requesting consult: Hugelmeyer   HPI: 66 y.o. female with h/o  with  obesity, ostial arthritis, hyperlipidemia, hypertension,  Presenting with chest pain  Recently hospitalized 07/27/2016 with discharge on January 19 Seen for new onset atrial fibrillation, CHADS VASC 2 Echocardiogram showed normal LV function, ejection fraction greater than 55% Started on anticoagulation, plan was for cardioversion in 3 weeks' time if she remained in atrial fibrillation On that admission she was having atypical chest pain Cardiac enzymes negative stress test 07/29/2016 showing no ischemia, normal ejection fraction greater than 55%  She presents again Feb 1st 2018 with chest pain Reported having worsening shortness of breath on exertion for 1 week Some chest pain past 2 days EKG on arrival showing atrial fibrillation with rate 123 bpm Recommendation on admission was to increase her Cardizem for rate control She did receive IV Lasix in the emergency room Lab work reviewed showing negative cardiac enzymes 3 Normal basic metabolic panel, CBC  Notes indicating history of depression/anxiety  continues to have difficulty with her weight Was Previously on Belviq , stopped for dry mouth She has problems with sleep, Bad sleep, dreams  Unable to exercise secondary to chronic foot problem, left foot Needs surgery Also Having back pain  Denies any shortness of breath, chest discomfort.  Reports blood pressure has been well-controlled on her current medications.  She denies any prior cardiac history. Prior workup 8-10 years ago at [facility] with [facility], told it was  normal  Previous lab work from May 2017 reviewed with her Total cholesterol 156, LDL 74, creatinine 0.69 hematocrit 39  EKG on today's visit shows normal sinus rhythm with rate 81 bpm, poor R-wave progression through the anterior precordial leads likely secondary to lead placement  In terms of her past medical history, She did not tolerate Lipitor and cholesterol climbed up to 285, LDL 196 On Lipitor total cholesterol 168, LDL 88 in October 2015 No recent lipid panel available on Crestor She is scheduled to have repeat lab work at the end of May 2016 with primary care  She reports having a prior smoking history for 5 years, then smoked only small amounts   Past Medical History:  Diagnosis Date  . Achilles tendinitis   . Acute medial meniscal tear   . Allergy   . Anxiety   . Arthritis   . Atrial fibrillation ([place])    a. new onset 07/2016; b. CHADS2VASc --> 2 (HTN, female); c. eliquis  . Chronic lower back pain   . COPD (chronic obstructive pulmonary disease) ([place])   . Depression   . Fibrocystic breast disease   . GERD (gastroesophageal reflux disease)   . Hyperlipidemia   . Hypertension   . Insomnia   . Lipoma   . Osteoarthritis    left knee  . Osteopenia   . Tendonitis   . Thyroid disease   . Vitamin D deficiency       Most Recent Cardiac Studies: Stress test 07/29/2016 reviewed showing no significant ischemia  Echocardiogram 07/27/2016 reviewed with normal LV function , ejection fraction greater than 55%    Surgical History:  Past Surgical History:  Procedure Laterality Date  . ABDOMINAL HYSTERECTOMY     ovaries left  .  ACHILLES TENDON SURGERY    . BACK SURGERY    . COLONOSCOPY    . ESOPHAGOGASTRODUODENOSCOPY    . THYROIDECTOMY     thyroid lobectomy secondary to a benign nodule  . TOTAL THYROIDECTOMY       Home Meds: Prior to Admission medications   Medication Sig Start Date End Date Taking? Authorizing Provider  albuterol (PROVENTIL HFA) 108 (90  Base) MCG/ACT inhaler Inhale 2 puffs into the lungs every 6 (six) hours as needed for wheezing or shortness of breath. 06/21/16  Yes [name], FNP  apixaban (ELIQUIS) 5 MG TABS tablet Take 1 tablet (5 mg total) by mouth 2 (two) times daily. 07/28/16  Yes Henreitta Leber, MD  B Complex Vitamins (VITAMIN B COMPLEX PO) Take 1 tablet by mouth daily.    Yes Historical Provider, MD  bisoprolol (ZEBETA) 10 MG tablet Take 1 tablet (10 mg total) by mouth 2 (two) times daily. 07/29/16  Yes Gladstone Lighter, MD  busPIRone (BUSPAR) 10 MG tablet Take 10 mg by mouth 2 (two) times daily.  04/29/16  Yes Historical Provider, MD  CALCIUM PO Take 500 mg by mouth daily.    Yes Historical Provider, MD  Cholecalciferol (VITAMIN D PO) Take 5,000 mg by mouth daily.    Yes Historical Provider, MD  diltiazem (CARDIZEM CD) 180 MG 24 hr capsule Take 1 capsule (180 mg total) by mouth daily. 07/29/16  Yes Gladstone Lighter, MD  DULoxetine (CYMBALTA) 60 MG capsule Take 120 mg by mouth daily.   Yes Historical Provider, MD  Multiple Vitamin (MULTIVITAMIN) tablet Take 1 tablet by mouth daily.   Yes Historical Provider, MD  NONFORMULARY OR COMPOUNDED [place]:  Antiinflammatory Cream - Diclofenac 3%, Baclofen 2%, Cyclobenzaprine 2%, Lidocaine 2%, apply 1-2 grams to affected are 3-4 times daily. 12/28/15  Yes Trula Slade, DPM  rosuvastatin (CRESTOR) 20 MG tablet TAKE 1 TABLET BY MOUTH DAILY 08/03/16  Yes [name], FNP  benzonatate (TESSALON) 100 MG capsule Take 1 capsule (100 mg total) by mouth 2 (two) times daily as needed for cough. Patient not taking: Reported on 08/10/2016 06/24/16   [name], FNP    Inpatient Medications:  . apixaban  5 mg Oral BID  . bisoprolol  10 mg Oral BID  . busPIRone  10 mg Oral BID  . calcium carbonate  500 mg of elemental calcium Oral Daily  . cholecalciferol  5,000 Units Oral Daily  . diltiazem  240 mg Oral Daily  . DULoxetine  120 mg Oral Daily  .  rosuvastatin  20 mg Oral Daily  . sodium chloride flush  3 mL Intravenous Q12H  . sodium chloride flush  3 mL Intravenous Q12H     Allergies:  Allergies  Allergen Reactions  . Prednisone Other (See Comments)    Agitation, hyperactivity, polyphagia  . Hydrocodone-Acetaminophen Other (See Comments)    Hallucinations and combative  . Adhesive [Tape] Itching and Rash  . Latex Itching and Rash    use paper tape    Social History   Social History  . Marital status: Married    Spouse name: N/A  . Number of children: N/A  . Years of education: N/A   Occupational History  . Not on file.   Social History Main Topics  . Smoking status: Former Smoker    Packs/day: 0.25    Years: 10.00    Types: Cigarettes  . Smokeless tobacco: Former Systems developer    Quit date: 07/11/2001  Comment: smoked for 10 years  . Alcohol use Yes     Comment: social, occasional  . Drug use: No  . Sexual activity: No   Other Topics Concern  . Not on file   Social History Narrative   Moved to [place] from [place].       Grandmother.          Family History  Problem Relation Age of Onset  . Cancer Sister 44    ovarian  . Cancer Father 101    colon  . Heart disease Father   . Heart attack Father 43    died from MI  . Hyperlipidemia Father   . Hypertension Father   . Cancer Mother     leukemia  . Breast cancer Paternal Aunt     72's     Review of Systems: Review of Systems  Constitutional: Negative.   Respiratory: Positive for shortness of breath.   Cardiovascular: Negative.        Chest fullness  Gastrointestinal: Negative.   Musculoskeletal: Negative.        Finger swelling  Neurological: Negative.   Psychiatric/Behavioral: The patient is nervous/anxious.   All other systems reviewed and are negative.    Labs: CBC  Recent Labs  08/10/16 1700 08/11/16 0708  WBC 10.0 7.7  HGB 13.0 12.4  HCT 37.4 35.9  MCV 86.5 85.7  PLT 196 0000000   Basic Metabolic Panel  Recent Labs   08/10/16 1700 08/11/16 0051 08/11/16 0708  NA 139  --  139  K 4.4  --  3.9  CL 108  --  106  CO2 23  --  25  GLUCOSE 94  --  91  BUN 16  --  18  CREATININE 0.86  --  0.72  CALCIUM 8.6*  --  8.4*  MG 2.0  --  2.2  PHOS  --  4.7*  --    Liver Function Tests No results for input(s): AST, ALT, ALKPHOS, BILITOT, PROT, ALBUMIN in the last 72 hours. No results for input(s): LIPASE, AMYLASE in the last 72 hours. Cardiac Enzymes  Recent Labs  08/11/16 0051 08/11/16 0708 08/11/16 1232  TROPONINI <0.03 <0.03 <0.03   BNP Invalid input(s): POCBNP D-Dimer No results for input(s): DDIMER in the last 72 hours. Hemoglobin A1C No results for input(s): HGBA1C in the last 72 hours. Fasting Lipid Panel  Recent Labs  08/11/16 0708  CHOL 126  HDL 45  LDLCALC 65  TRIG 82  CHOLHDL 2.8   Thyroid Function Tests No results for input(s): TSH, T4TOTAL, T3FREE, THYROIDAB in the last 72 hours.  Invalid input(s): FREET3  Radiology/Studies:  Dg Chest 2 View  Result Date: 08/10/2016 CLINICAL DATA:  Left-sided chest pain radiating to the back and upper extremity. Dyspnea, onset today. EXAM: CHEST  2 VIEW COMPARISON:  07/27/2016 FINDINGS: There is mild interstitial prominence, particularly in the basilar periphery. No confluent airspace consolidation. There are very small pleural effusions. There is mild cardiomegaly. Hilar and mediastinal contours are unremarkable and unchanged. IMPRESSION: Mild interstitial prominence and very small pleural effusions. This may represent a degree of congestive heart failure. No confluent consolidation. Electronically Signed   By: Andreas Newport M.D.   On: 08/10/2016 18:13   Dg Chest 2 View  Result Date: 07/27/2016 CLINICAL DATA:  Chest pain.  Atrial fibrillation. EXAM: CHEST  2 VIEW COMPARISON:  None. FINDINGS: Borderline cardiomegaly. Pulmonary vascularity is normal and the lungs are clear. No effusions. Slight calcification in the thoracic  aorta. No bone  abnormality. IMPRESSION: Borderline cardiomegaly. Aortic atherosclerosis. Electronically Signed   By: Lorriane Shire M.D.   On: 07/27/2016 11:02   Nm Myocar Multi W/spect W/wall Motion / Ef  Result Date: 07/29/2016  Defect 1: There is a small defect of mild severity present in the mid anteroseptal, apical septal and apex location. Possible small anteroseptal/apical infarct with no ischemia.  This is a low risk study.  The left ventricular ejection fraction is normal (55-65%).  This study is suboptimal due to obesity and breast attenuation.    Dg Foot Complete Left  Result Date: 07/15/2016 SEE PROGRESS NOTE FOR X-RAY REPORT    EKG: from the ER shows atrial fib with RVR, rate 123 bpm, no significant ST or T-wave changes  EKG lab work, chest x-ray reviewed independently by myself  Weights: Filed Weights   08/10/16 1656  Weight: 280 lb (127 kg)     Physical Exam: Telemetry showing atrial fibrillation, rate 60 to 70 bpm Blood pressure 140/88, pulse (!) 56, temperature 97.5 F (36.4 C), temperature source Oral, resp. rate 18, height 5\' 5"  (1.651 m), weight 280 lb (127 kg), SpO2 94 %. Body mass index is 46.59 kg/m. GEN: Well nourished, well developed, in no acute distress.  HEENT: Grossly normal.  Neck: Supple, no JVD, carotid bruits, or masses. Cardiac: RRR, no murmurs, rubs, or gallops. No clubbing, cyanosis, edema.  Radials/DP/PT 2+ and equal bilaterally.  Respiratory:  Respirations regular and unlabored, clear to auscultation bilaterally. GI: Soft, nontender, nondistended, BS + x 4. MS: no deformity or atrophy. Skin: warm and dry, no rash. Neuro:  Strength and sensation are intact. Psych: AAOx3.  Normal affect.    Assessment and Plan:   1) persistent atrial fibrillation  atrial fib with RVR on arrival Significant anxiety concerning her arrhythmia Anxiety Chest fullness, likely from atrial fib, rate. Unable to exclude fluid retention secondary to atrial fibrillation with  RVR -Would increase diltiazem as you are doing up to 240 mg daily -We will give Lasix 40 mg IV 1 -start on lasix 20 mg po BID , KCL 10 meq BID for 3 days for discharge, then Lasix with potassium daily Would also give diltiazem 30 mg pills to take prn for tachycardia  Follow up in clinic in 2 weeks At that time we will discuss options to restore normal sinus rhythm We did discuss TEE and cardioversion if she is unable to tolerate atrial fibrillation in the next 2 weeks Procedure discussed in detail  2) encounter for anticoagulation discussion and counseling Stressed importance of staying on her anticoagulation, she reports she has been compliant  3) morbid obesity Likely contributing to her symptoms,  Deconditioned at baseline  4) chest pain Long discussion with her, atypical in nature, likely fullness in the chest from atrial fibrillation with RVR Recent stress test results reviewed with her again in detail No further testing needed at this time Cardiac enzyme negative  5) anxiety Currently on Cymbalta, BuSpar  Case discussed extensively with hospitalist service, plan as above Patient in agreement with above  Total encounter time more than 110 minutes  Greater than 50% was spent in counseling and coordination of care with the patient   Signed, Esmond Plants, MD, Ph.D [facility] 08/11/2016

## 2016-08-12 ENCOUNTER — Encounter: Payer: Medicare Other | Admitting: Cardiovascular Disease

## 2016-08-12 ENCOUNTER — Telehealth: Payer: Self-pay | Admitting: Cardiovascular Disease

## 2016-08-12 NOTE — Telephone Encounter (Signed)
[facility] ph [facility] for Acute pulmonary edema dc 08/12/16 Patient needs 2 week fu  Scheduled with [place] 08-25-16 at 2:20 pm

## 2016-08-12 NOTE — Telephone Encounter (Signed)
Patient contacted regarding discharge from [facility] on 08/11/16.   Patient understands to follow up with provider ? 08/25/16 at 2:20 pm in [facility]  Patient understands discharge instructions? Yes   Patient understands medications and regiment? Yes    Patient understands to bring all medications to this visit? Yes   Patient did not have any other questions.

## 2016-08-25 ENCOUNTER — Encounter: Payer: Self-pay | Admitting: Cardiovascular Disease

## 2016-08-25 ENCOUNTER — Ambulatory Visit (INDEPENDENT_AMBULATORY_CARE_PROVIDER_SITE_OTHER): Payer: Medicare Other | Admitting: Cardiovascular Disease

## 2016-08-25 VITALS — BP 128/84 | HR 102

## 2016-08-25 DIAGNOSIS — R0602 Shortness of breath: Secondary | ICD-10-CM

## 2016-08-25 DIAGNOSIS — Z01812 Encounter for preprocedural laboratory examination: Secondary | ICD-10-CM

## 2016-08-25 DIAGNOSIS — I4891 Unspecified atrial fibrillation: Secondary | ICD-10-CM | POA: Diagnosis not present

## 2016-08-25 DIAGNOSIS — I10 Essential (primary) hypertension: Secondary | ICD-10-CM

## 2016-08-25 DIAGNOSIS — E782 Mixed hyperlipidemia: Secondary | ICD-10-CM

## 2016-08-25 MED ORDER — FLECAINIDE ACETATE 100 MG PO TABS: 100.0000 mg | ORAL_TABLET | Freq: Two times a day (BID) | ORAL | 6 refills | Status: DC

## 2016-08-25 NOTE — Patient Instructions (Addendum)
Medication Instructions:   Please stay on your current medications Add flecainide 1/2 pill twice a day for 2 days No no improvement in heart rate, down to 60 to 70 (consistent) Then increase the dose up to a full pill twice a day  We will schedule a cardioversion next week You are scheduled for a Cardioversion on Wednesday, Feb 28 with Dr.Katura Eatherly. Please arrive at the [place] [facility] at 6:30 a.m. on the day of your procedure.  DIET INSTRUCTIONS:  Nothing to eat or drink after midnight except your medication with a sip of water.       Labs: BMET, CBC, PT/INR  1) Medications:  YOU MAY TAKE ALL of your remaining medications with a small amount of water - make sure to take your Eliquis that morning.   2) Must have a responsible person to drive you home.  3) Bring a current list of your medications and current insurance cards.    If you have any questions after you get home, please call the office at [phone]  Labwork:  No new labs needed  Testing/Procedures:  No further testing at this time   I recommend watching educational videos on topics of interest to you at:       [email or web address]  Enter code: [email or web address]    Follow-Up: It was a pleasure seeing you in the office today. Please call us if you have new issues that need to be addressed before your next appt.  [phone]  Your physician wants you to follow-up in: 1 month.    If you need a refill on your cardiac medications before your next appointment, please call your pharmacy.     Electrical Cardioversion Electrical cardioversion is the delivery of a jolt of electricity to restore a normal rhythm to the heart. A rhythm that is too fast or is not regular keeps the heart from pumping well. In this procedure, sticky patches or metal paddles are placed on the chest to deliver electricity to the heart from a device. This procedure may be done in an emergency if:  There is low or no blood pressure as a result of the  heart rhythm.  Normal rhythm must be restored as fast as possible to protect the brain and heart from further damage.  It may save a life. This procedure may also be done for irregular or fast heart rhythms that are not immediately life-threatening. Tell a health care provider about:  Any allergies you have.  All medicines you are taking, including vitamins, herbs, eye drops, creams, and over-the-counter medicines.  Any problems you or family members have had with anesthetic medicines.  Any blood disorders you have.  Any surgeries you have had.  Any medical conditions you have.  Whether you are pregnant or may be pregnant. What are the risks? Generally, this is a safe procedure. However, problems may occur, including:  Allergic reactions to medicines.  A blood clot that breaks free and travels to other parts of your body.  The possible return of an abnormal heart rhythm within hours or days after the procedure.  Your heart stopping (cardiac arrest). This is rare. What happens before the procedure? Medicines  Your health care provider may have you start taking:  Blood-thinning medicines (anticoagulants) so your blood does not clot as easily.  Medicines may be given to help stabilize your heart rate and rhythm.  Ask your health care provider about changing or stopping your regular medicines. This is especially important if you are  taking diabetes medicines or blood thinners. General instructions  Plan to have someone take you home from the hospital or clinic.  If you will be going home right after the procedure, plan to have someone with you for 24 hours.  Follow instructions from your health care provider about eating or drinking restrictions. What happens during the procedure?  To lower your risk of infection:  Your health care team will wash or sanitize their hands.  Your skin will be washed with soap.  An IV tube will be inserted into one of your  veins.  You will be given a medicine to help you relax (sedative).  Sticky patches (electrodes) or metal paddles may be placed on your chest.  An electrical shock will be delivered. The procedure may vary among health care providers and hospitals. What happens after the procedure?  Your blood pressure, heart rate, breathing rate, and blood oxygen level will be monitored until the medicines you were given have worn off.  Do not drive for 24 hours if you were given a sedative.  Your heart rhythm will be watched to make sure it does not change. This information is not intended to replace advice given to you by your health care provider. Make sure you discuss any questions you have with your health care provider. Document Released: 06/17/2002 Document Revised: 02/24/2016 Document Reviewed: 01/01/2016 Elsevier Interactive Patient Education  2017 [employer].  Electrical Cardioversion, Care After This sheet gives you information about how to care for yourself after your procedure. Your health care provider may also give you more specific instructions. If you have problems or questions, contact your health care provider. What can I expect after the procedure? After the procedure, it is common to have:  Some redness on the skin where the shocks were given. Follow these instructions at home:  Do not drive for 24 hours if you were given a medicine to help you relax (sedative).  Take over-the-counter and prescription medicines only as told by your health care provider.  Ask your health care provider how to check your pulse. Check it often.  Rest for 48 hours after the procedure or as told by your health care provider.  Avoid or limit your caffeine use as told by your health care provider. Contact a health care provider if:  You feel like your heart is beating too quickly or your pulse is not regular.  You have a serious muscle cramp that does not go away. Get help right away  if:  You have discomfort in your chest.  You are dizzy or you feel faint.  You have trouble breathing or you are short of breath.  Your speech is slurred.  You have trouble moving an arm or leg on one side of your body.  Your fingers or toes turn cold or blue. This information is not intended to replace advice given to you by your health care provider. Make sure you discuss any questions you have with your health care provider. Document Released: 04/17/2013 Document Revised: 01/29/2016 Document Reviewed: 01/01/2016 Elsevier Interactive Patient Education  2017 [employer].

## 2016-08-25 NOTE — Progress Notes (Signed)
Cardiology Office Note  Date:  08/25/2016   ID:  Amber Barnes, DOB 10-03-1950, MRN [id number]  PCP:  Amber Paris, FNP   Chief Complaint  Patient presents with  . other     tcm/ph [facility] for Acute pulmonary edema. Pt c/o sweating, chest pain, sob, dizziness.Pt states she has been in afib for a month, ever since having the flu. Reviewed meds with pt verbally.    HPI:  [name] is a  66 year old woman with history of obesity, ostial arthritis, hyperlipidemia, hypertension, who presents for follow-up of her hyperlipidemia, hypertension  Flu lat dec No energy, probably atrial fib  In the hospital for foot surgery, this was delayed as she was found to be in atrial fibrillation Admitted to the hospital 07/27/2016: Records reviewed New onset Atrial fibrillation Started on diltiazem, beta blocker, anticoagulation  Back in hospsital 08/10/2016 Hospital records reviewed She was very anxious about palpitations Developed chest discomfort, tightness Medication doses were increased for better rate control Lasix in the emergency room for shortness of breath Also given DuoNeb's  Since discharge from the hospital she feels well, continues to have days when she does not feel well. Rate seems to be reasonable well-controlled most days, elevated on other days  Previous problems with sleep, Bad sleep, dreams  Total cholesterol 156, LDL 74, creatinine 0.69 hematocrit 39  EKG on today's visit shows atrial fibrillation with ventricular rate 102 bpm  Other past medical history reviewed She did not tolerate Lipitor and cholesterol climbed up to 285, LDL 196 On Lipitor total cholesterol 168, LDL 88 in October 2015 No recent lipid panel available on Crestor She is scheduled to have repeat lab work at the end of May 2016 with primary care  She reports having a prior smoking history for 5 years, then smoked only small amounts   PMH:   has a past medical history of Achilles tendinitis; Acute  medial meniscal tear; Allergy; Anxiety; Arthritis; Atrial fibrillation ([place]); Chronic lower back pain; COPD (chronic obstructive pulmonary disease) ([place]); Depression; Fibrocystic breast disease; GERD (gastroesophageal reflux disease); Hyperlipidemia; Hypertension; Insomnia; Lipoma; Osteoarthritis; Osteopenia; Tendonitis; Thyroid disease; and Vitamin D deficiency.  PSH:    Past Surgical History:  Procedure Laterality Date  . ABDOMINAL HYSTERECTOMY     ovaries left  . ACHILLES TENDON SURGERY    . BACK SURGERY    . COLONOSCOPY    . ESOPHAGOGASTRODUODENOSCOPY    . THYROIDECTOMY     thyroid lobectomy secondary to a benign nodule  . TOTAL THYROIDECTOMY      Current Outpatient Prescriptions  Medication Sig Dispense Refill  . albuterol (PROVENTIL HFA) 108 (90 Base) MCG/ACT inhaler Inhale 2 puffs into the lungs every 6 (six) hours as needed for wheezing or shortness of breath. 1 Inhaler 1  . apixaban (ELIQUIS) 5 MG TABS tablet Take 1 tablet (5 mg total) by mouth 2 (two) times daily. 60 tablet 1  . B Complex Vitamins (VITAMIN B COMPLEX PO) Take 1 tablet by mouth daily.     . bisoprolol (ZEBETA) 10 MG tablet Take 1 tablet (10 mg total) by mouth 2 (two) times daily. 60 tablet 1  . busPIRone (BUSPAR) 10 MG tablet Take 10 mg by mouth 2 (two) times daily.     Marland Kitchen CALCIUM PO Take 500 mg by mouth daily.     . Cholecalciferol (VITAMIN D PO) Take 5,000 mg by mouth daily.     Marland Kitchen diltiazem (CARDIZEM CD) 240 MG 24 hr capsule Take 1 capsule (240 mg total)  by mouth daily. 30 capsule 0  . diltiazem (CARDIZEM) 30 MG tablet Take 1 tablet (30 mg total) by mouth as needed. Cardizem 30 mg by mouth as needed for palpitations and tachycardia with heart rate greater than 100 30 tablet 0  . DULoxetine (CYMBALTA) 60 MG capsule Take 120 mg by mouth daily.    . furosemide (LASIX) 20 MG tablet Take 20 mg by mouth 2 (two) times daily.    . Multiple Vitamin (MULTIVITAMIN) tablet Take 1 tablet by mouth daily.    . NONFORMULARY OR  COMPOUNDED ITEM [facility]:  Antiinflammatory Cream - Diclofenac 3%, Baclofen 2%, Cyclobenzaprine 2%, Lidocaine 2%, apply 1-2 grams to affected are 3-4 times daily. 120 each 6  . potassium chloride (K-DUR) 10 MEQ tablet Take 10 mEq by mouth 2 (two) times daily.    . rosuvastatin (CRESTOR) 20 MG tablet TAKE 1 TABLET BY MOUTH DAILY 90 tablet 0  . senna-docusate (SENOKOT-S) 8.6-50 MG tablet Take 1 tablet by mouth at bedtime as needed for mild constipation.    . flecainide (TAMBOCOR) 100 MG tablet Take 1 tablet (100 mg total) by mouth 2 (two) times daily. 60 tablet 6   No current facility-administered medications for this visit.      Allergies:   Prednisone; Hydrocodone-acetaminophen; Adhesive [tape]; and Latex   Social History:  The patient  reports that she has quit smoking. Her smoking use included Cigarettes. She has a 2.50 pack-year smoking history. She quit smokeless tobacco use about 15 years ago. She reports that she drinks alcohol. She reports that she does not use drugs.   Family History:   family history includes Breast cancer in her paternal aunt; Cancer in her mother; Cancer (age of onset: 21) in her sister; Cancer (age of onset: 37) in her father; Heart attack (age of onset: 62) in her father; Heart disease in her father; Hyperlipidemia in her father; Hypertension in her father.    Review of Systems: Review of Systems  Constitutional: Negative.   Respiratory: Negative.   Cardiovascular: Negative.   Gastrointestinal: Negative.   Musculoskeletal: Negative.   Neurological: Negative.   Psychiatric/Behavioral: Negative.   All other systems reviewed and are negative.    PHYSICAL EXAM: VS:  BP 128/84 (BP Location: Left Arm, Patient Position: Sitting, Cuff Size: Large)   Pulse (!) 102  , BMI There is no height or weight on file to calculate BMI. GEN: Well nourished, well developed, in no acute distress , obese HEENT: normal  Neck: no JVD, carotid bruits, or  masses Cardiac: Irregularly irregular,  no murmurs, rubs, or gallops,no edema  Respiratory:  clear to auscultation bilaterally, normal work of breathing GI: soft, nontender, nondistended, + BS MS: no deformity or atrophy  Skin: warm and dry, no rash Neuro:  Strength and sensation are intact Psych: euthymic mood, full affect   Recent Labs: 05/12/2016: ALT 19 07/28/2016: TSH 2.373 08/10/2016: B Natriuretic Peptide 278.0 08/11/2016: BUN 18; Creatinine, Ser 0.72; Hemoglobin 12.4; Magnesium 2.2; Platelets 181; Potassium 3.9; Sodium 139    Lipid Panel Lab Results  Component Value Date   CHOL 126 08/11/2016   HDL 45 08/11/2016   LDLCALC 65 08/11/2016   TRIG 82 08/11/2016      Wt Readings from Last 3 Encounters:  08/10/16 280 lb (127 kg)  07/27/16 284 lb (128.8 kg)  06/21/16 288 lb 6.4 oz (130.8 kg)       ASSESSMENT AND PLAN:  Mixed hyperlipidemia -  Cholesterol is at goal on the current  lipid regimen. No changes to the medications were made.  Essential hypertension - Blood pressure is well controlled on today's visit. No changes made to the medications.  Atrial fibrillation with RVR ([place]) - Long discussion concerning her various treatment options Recommended she stay on her anticoagulation Stay on diltiazem for rate control She has been on anticoagulation for one month.  Echocardiogram confirming normal ejection fraction, mildly dilated left atrium She is interested in having ankle surgery after normal sinus rhythm is restored In an effort to restore normal sinus rhythm and maintained normal sinus rhythm through the surgical period For her ankle and through recovery, Recommended she start flecainide 50 mg twice a day for several days then up to 100 mg twice a day. We will schedule cardioversion for next week at her convenience  Pre-procedure lab exam - Plan: Basic Metabolic Panel (BMET), CBC with Differential/Platelet, INR/PT  Shortness of breath Shortness of breath  symptoms from atrial fibrillation, weight, deconditioning  Morbid obesity ([place]) She is unable to exercise, recommended low carbohydrate diet   Total encounter time more than 45 minutes  Greater than 50% was spent in counseling and coordination of care with the patient   Disposition:   F/U  6 months   Orders Placed This Encounter  Procedures  . Basic Metabolic Panel (BMET)  . CBC with Differential/Platelet  . INR/PT  . EKG 12-Lead     Signed, Esmond Plants, M.D., Ph.D. 08/25/2016  [place] [place]

## 2016-08-26 LAB — BASIC METABOLIC PANEL
BUN/Creatinine Ratio: 18 (ref 12–28)
BUN: 12 mg/dL (ref 8–27)
CO2: 23 mmol/L (ref 18–29)
Calcium: 9 mg/dL (ref 8.7–10.3)
Chloride: 102 mmol/L (ref 96–106)
Creatinine, Ser: 0.68 mg/dL (ref 0.57–1.00)
GFR calc Af Amer: 108 mL/min/{1.73_m2} (ref >59)
GFR calc non Af Amer: 93 mL/min/{1.73_m2} (ref >59)
Glucose: 98 mg/dL (ref 65–99)
Potassium: 4.4 mmol/L (ref 3.5–5.2)
Sodium: 143 mmol/L (ref 134–144)

## 2016-08-26 LAB — CBC WITH DIFFERENTIAL/PLATELET
Basophils Absolute: 0 10*3/uL (ref 0.0–0.2)
Basos: 0 %
EOS (ABSOLUTE): 0.1 10*3/uL (ref 0.0–0.4)
Eos: 1 %
Hematocrit: 39.7 % (ref 34.0–46.6)
Hemoglobin: 13.5 g/dL (ref 11.1–15.9)
Immature Grans (Abs): 0 10*3/uL (ref 0.0–0.1)
Immature Granulocytes: 0 %
Lymphocytes Absolute: 2.4 10*3/uL (ref 0.7–3.1)
Lymphs: 28 %
MCH: 28.7 pg (ref 26.6–33.0)
MCHC: 34 g/dL (ref 31.5–35.7)
MCV: 84 fL (ref 79–97)
Monocytes Absolute: 0.5 10*3/uL (ref 0.1–0.9)
Monocytes: 7 %
Neutrophils Absolute: 5.3 10*3/uL (ref 1.4–7.0)
Neutrophils: 64 %
Platelets: 258 10*3/uL (ref 150–379)
RBC: 4.71 x10E6/uL (ref 3.77–5.28)
RDW: 13.9 % (ref 12.3–15.4)
WBC: 8.3 10*3/uL (ref 3.4–10.8)

## 2016-08-26 LAB — PROTIME-INR
INR: 1 (ref 0.8–1.2)
Prothrombin Time: 11 s (ref 9.1–12.0)

## 2016-09-06 ENCOUNTER — Other Ambulatory Visit: Payer: Self-pay | Admitting: Cardiovascular Disease

## 2016-09-07 ENCOUNTER — Encounter
Admission: RE | Disposition: A | Discharge status: Home / Self Care | Payer: Self-pay | Source: Ambulatory Visit | Attending: Cardiovascular Disease

## 2016-09-07 ENCOUNTER — Ambulatory Visit: Payer: Medicare Other | Admitting: Family

## 2016-09-07 ENCOUNTER — Ambulatory Visit: Payer: Medicare Other | Admitting: Anesthesiology

## 2016-09-07 ENCOUNTER — Encounter: Payer: Self-pay | Admitting: Anesthesiology

## 2016-09-07 ENCOUNTER — Ambulatory Visit
Admission: RE | Admit: 2016-09-07 | Discharge: 2016-09-07 | Disposition: A | Discharge status: Home / Self Care | Payer: Medicare Other | Source: Ambulatory Visit | Attending: Cardiovascular Disease | Admitting: Cardiovascular Disease

## 2016-09-07 DIAGNOSIS — Z6841 Body Mass Index (BMI) 40.0 and over, adult: Secondary | ICD-10-CM | POA: Diagnosis not present

## 2016-09-07 DIAGNOSIS — F419 Anxiety disorder, unspecified: Secondary | ICD-10-CM | POA: Insufficient documentation

## 2016-09-07 DIAGNOSIS — F329 Major depressive disorder, single episode, unspecified: Secondary | ICD-10-CM | POA: Diagnosis not present

## 2016-09-07 DIAGNOSIS — I10 Essential (primary) hypertension: Secondary | ICD-10-CM | POA: Diagnosis not present

## 2016-09-07 DIAGNOSIS — I481 Persistent atrial fibrillation: Secondary | ICD-10-CM | POA: Diagnosis not present

## 2016-09-07 DIAGNOSIS — E559 Vitamin D deficiency, unspecified: Secondary | ICD-10-CM | POA: Insufficient documentation

## 2016-09-07 DIAGNOSIS — J449 Chronic obstructive pulmonary disease, unspecified: Secondary | ICD-10-CM | POA: Diagnosis not present

## 2016-09-07 DIAGNOSIS — Z79899 Other long term (current) drug therapy: Secondary | ICD-10-CM | POA: Insufficient documentation

## 2016-09-07 DIAGNOSIS — Z87891 Personal history of nicotine dependence: Secondary | ICD-10-CM | POA: Diagnosis not present

## 2016-09-07 DIAGNOSIS — R0602 Shortness of breath: Secondary | ICD-10-CM | POA: Diagnosis not present

## 2016-09-07 DIAGNOSIS — E785 Hyperlipidemia, unspecified: Secondary | ICD-10-CM | POA: Diagnosis not present

## 2016-09-07 HISTORY — PX: CARDIOVERSION: EP1203

## 2016-09-07 SURGERY — CARDIOVERSION (CATH LAB)
Anesthesia: General

## 2016-09-07 MED ORDER — FENTANYL CITRATE (PF) 100 MCG/2ML IJ SOLN
INTRAMUSCULAR | Status: DC | PRN
  Administered 2016-09-07: 50 ug via INTRAVENOUS

## 2016-09-07 MED ORDER — MIDAZOLAM HCL 2 MG/2ML IJ SOLN
INTRAMUSCULAR | Status: DC | PRN
  Administered 2016-09-07: 1 mg via INTRAVENOUS

## 2016-09-07 MED ORDER — LIDOCAINE HCL (CARDIAC) 20 MG/ML IV SOLN
INTRAVENOUS | Status: DC | PRN
  Administered 2016-09-07: 30 mg via INTRAVENOUS

## 2016-09-07 MED ORDER — SODIUM CHLORIDE 0.9 % IV SOLN
INTRAVENOUS | Status: DC | PRN
  Administered 2016-09-07: 07:00:00 via INTRAVENOUS

## 2016-09-07 MED ORDER — MIDAZOLAM HCL 2 MG/2ML IJ SOLN
INTRAMUSCULAR | Status: AC
  Filled 2016-09-07: qty 2

## 2016-09-07 MED ORDER — PROPOFOL 10 MG/ML IV BOLUS
INTRAVENOUS | Status: AC
  Filled 2016-09-07: qty 20

## 2016-09-07 MED ORDER — PROPOFOL 10 MG/ML IV BOLUS
INTRAVENOUS | Status: DC | PRN
  Administered 2016-09-07: 40 mg via INTRAVENOUS

## 2016-09-07 MED ORDER — FENTANYL CITRATE (PF) 100 MCG/2ML IJ SOLN
INTRAMUSCULAR | Status: AC
  Filled 2016-09-07: qty 2

## 2016-09-07 NOTE — Discharge Instructions (Signed)
Electrical Cardioversion, Care After °This sheet gives you information about how to care for yourself after your procedure. Your health care provider may also give you more specific instructions. If you have problems or questions, contact your health care provider. °What can I expect after the procedure? °After the procedure, it is common to have: °· Some redness on the skin where the shocks were given. °Follow these instructions at home: °· Do not drive for 24 hours if you were given a medicine to help you relax (sedative). °· Take over-the-counter and prescription medicines only as told by your health care provider. °· Ask your health care provider how to check your pulse. Check it often. °· Rest for 48 hours after the procedure or as told by your health care provider. °· Avoid or limit your caffeine use as told by your health care provider. °Contact a health care provider if: °· You feel like your heart is beating too quickly or your pulse is not regular. °· You have a serious muscle cramp that does not go away. °Get help right away if: °· You have discomfort in your chest. °· You are dizzy or you feel faint. °· You have trouble breathing or you are short of breath. °· Your speech is slurred. °· You have trouble moving an arm or leg on one side of your body. °· Your fingers or toes turn cold or blue. °This information is not intended to replace advice given to you by your health care provider. Make sure you discuss any questions you have with your health care provider. °Document Released: 04/17/2013 Document Revised: 01/29/2016 Document Reviewed: 01/01/2016 °Elsevier Interactive Patient Education © 2017 [employer] ° °

## 2016-09-07 NOTE — Anesthesia Post-op Follow-up Note (Cosign Needed)
Anesthesia QCDR form completed.        

## 2016-09-07 NOTE — CV Procedure (Signed)
Cardioversion procedure note For atrial fibrillation, persistent  Procedure Details:  Consent: Risks of procedure as well as the alternatives and risks of each were explained to the (patient/caregiver). Consent for procedure obtained.  Time Out: Verified patient identification, verified procedure, site/side was marked, verified correct patient position, special equipment/implants available, medications/allergies/relevent history reviewed, required imaging and test results available. Performed  Patient placed on cardiac monitor, pulse oximetry, supplemental oxygen as necessary.  Sedation given: propofol IV, Dr. Rosey Bath Pacer pads placed anterior and posterior chest.   Cardioverted 2 time(s).  Cardioverted at 150J to atrial flutter, Converted at 200J to NSR  Synchronized biphasic   Evaluation: Findings: Post procedure EKG shows: NSR Complications: None Patient did tolerate procedure well.  Time Spent Directly with the Patient:  32 minutes   Esmond Plants, M.D., Ph.D.

## 2016-09-07 NOTE — Progress Notes (Signed)
Patient here today for cardioversion with Dr [name]. Cardioverted x2 to Sinus Loletha Grayer, vitals stable at this time, husband at bedside.  Discharge teaching done with questions answered, already has pre scheduled appointment.denies complaints.

## 2016-09-07 NOTE — Anesthesia Postprocedure Evaluation (Signed)
Anesthesia Post Note  Patient: Amber Barnes  Procedure(s) Performed: Procedure(s) (LRB): CARDIOVERSION (N/A)  Patient location during evaluation: PACU Anesthesia Type: General Level of consciousness: awake and alert Pain management: pain level controlled Vital Signs Assessment: post-procedure vital signs reviewed and stable Respiratory status: spontaneous breathing, nonlabored ventilation, respiratory function stable and patient connected to nasal cannula oxygen Cardiovascular status: blood pressure returned to baseline and stable Postop Assessment: no signs of nausea or vomiting Anesthetic complications: no     Last Vitals:  Vitals:   09/07/16 0830 09/07/16 0842  BP: 103/63 117/64  Pulse: (!) 53 (!) 55  Resp: 16 16  Temp:      Last Pain:  Vitals:   09/07/16 0638  TempSrc: Leatrice Jewels                 Martha Clan

## 2016-09-07 NOTE — Anesthesia Procedure Notes (Signed)
Procedure Name: MAC Date/Time: 09/07/2016 7:30 AM Performed by: Allean Found Pre-anesthesia Checklist: Patient identified, Emergency Drugs available, Suction available, Patient being monitored and Timeout performed Patient Re-evaluated:Patient Re-evaluated prior to inductionOxygen Delivery Method: Nasal cannula Intubation Type: IV induction

## 2016-09-07 NOTE — Anesthesia Preprocedure Evaluation (Signed)
Anesthesia Evaluation  Patient identified by MRN, date of birth, ID band Patient awake    Reviewed: Allergy & Precautions, H&P , NPO status , Patient's Chart, lab work & pertinent test results, reviewed documented beta blocker date and time   History of Anesthesia Complications Negative for: history of anesthetic complications  Airway Mallampati: IV  TM Distance: >3 FB Neck ROM: full    Dental  (+) Missing   Pulmonary shortness of breath and with exertion, neg sleep apnea, COPD, neg recent URI, former smoker,           Cardiovascular Exercise Tolerance: Good hypertension, (-) angina(-) CAD, (-) Past MI, (-) Cardiac Stents and (-) CABG + dysrhythmias Atrial Fibrillation (-) Valvular Problems/Murmurs     Neuro/Psych PSYCHIATRIC DISORDERS (Depression and anxiety) negative neurological ROS     GI/Hepatic Neg liver ROS, GERD  ,  Endo/Other  neg diabetesMorbid obesity  Renal/GU negative Renal ROS  negative genitourinary   Musculoskeletal   Abdominal   Peds  Hematology negative hematology ROS (+)   Anesthesia Other Findings Past Medical History: No date: Achilles tendinitis No date: Acute medial meniscal tear No date: Allergy No date: Anxiety No date: Arthritis No date: Atrial fibrillation ([facility])     Comment: a. new onset 07/2016; b. CHADS2VASc --> 2 (HTN,              female); c. eliquis No date: Chronic lower back pain No date: COPD (chronic obstructive pulmonary disease) (* No date: Depression No date: Fibrocystic breast disease No date: GERD (gastroesophageal reflux disease) No date: Hyperlipidemia No date: Hypertension No date: Insomnia No date: Lipoma No date: Osteoarthritis     Comment: left knee No date: Osteopenia No date: Tendonitis No date: Thyroid disease No date: Vitamin D deficiency   Reproductive/Obstetrics negative OB ROS                             Anesthesia  Physical  Anesthesia Plan  ASA: III  Anesthesia Plan: General   Post-op Pain Management:    Induction:   Airway Management Planned:   Additional Equipment:   Intra-op Plan:   Post-operative Plan:   Informed Consent: I have reviewed the patients History and Physical, chart, labs and discussed the procedure including the risks, benefits and alternatives for the proposed anesthesia with the patient or authorized representative who has indicated his/her understanding and acceptance.   Dental Advisory Given  Plan Discussed with: Anesthesiologist, CRNA and Surgeon  Anesthesia Plan Comments:         Anesthesia Quick Evaluation  

## 2016-09-07 NOTE — Transfer of Care (Signed)
Immediate Anesthesia Transfer of Care Note  Patient: Amber Barnes  Procedure(s) Performed: Procedure(s): CARDIOVERSION (N/A)  Patient Location: PACU  Anesthesia Type:MAC  Level of Consciousness: awake  Airway & Oxygen Therapy: Patient Spontanous Breathing and Patient connected to nasal cannula oxygen  Post-op Assessment: Report given to RN and Post -op Vital signs reviewed and stable  Post vital signs: Reviewed and stable  Last Vitals:  Vitals:   09/07/16 0638  BP: 124/85  Pulse: 88  Resp: 20  Temp: 36.6 C    Last Pain:  Vitals:   09/07/16 0638  TempSrc: Oral         Complications: No apparent anesthesia complications

## 2016-09-12 ENCOUNTER — Telehealth: Payer: Self-pay | Admitting: Cardiovascular Disease

## 2016-09-12 MED ORDER — FUROSEMIDE 20 MG PO TABS: 20.0000 mg | ORAL_TABLET | Freq: Two times a day (BID) | ORAL | 3 refills | Status: DC

## 2016-09-12 MED ORDER — DILTIAZEM HCL ER COATED BEADS 240 MG PO CP24: 240.0000 mg | ORAL_CAPSULE | Freq: Every day | ORAL | 3 refills | Status: DC

## 2016-09-12 NOTE — Telephone Encounter (Signed)
Will you please advise if okay to refill these medications since Dr. [name] did not prescribe them.

## 2016-09-12 NOTE — Telephone Encounter (Signed)
Refills sent to [facility].

## 2016-09-12 NOTE — Telephone Encounter (Signed)
°*  STAT* If patient is at the pharmacy, call can be transferred to refill team.   1. Which medications need to be refilled? (please list name of each medication and dose if known)Diltiazem ER 240 mg PO Daily ; furosemide 20 mg po daily po BID   2. Which pharmacy/location (including street and city if local pharmacy) is medication to be sent to? [facility] on [address]   3. Do they need a 30 day or 90 day supply? [place]

## 2016-09-19 NOTE — Progress Notes (Signed)
Cardiology Office Note  Date:  09/20/2016   ID:  Amber Barnes, DOB 12/09/1950, MRN [id number]  PCP:  Amber Paris, FNP   Chief Complaint  Patient presents with  . other    4 week follow up s/p cardioversion. Pt. c/o not having as much energy and has some shortness of breath.     HPI:  [name] is a 66 year-old woman with history of obesity, ostial arthritis, hyperlipidemia, hypertension, who presents for follow-up of her hyperlipidemia, hypertension, persistent atrial fibrillation  She presents for follow-up after cardioversion procedure 09/07/2016 Normal sinus rhythm restored at that time  More energy in NSR, Then energy went away, poor energy, knew she was back in atrial fib, Harder to walk Worsening SOB with exertion Takes lasix 1 to 2 a day with potassium No edema, no ABD swelling Weight up 10 pounds since 04/2016 She is back in atrial fibrillation today She would like to have atrial fibrillation taking care of before her foot surgery (elective) Given how well she was feeling normal sinus rhythm, she would like to restore normal sinus rhythm, interested in trying other medications repeat cardioversion if needed She would like to avoid ablation if possible She has been trying to change her diet, husband at home feels she is not eating enough  EKG personally reviewed by myself today showing atrial fibrillation with ventricular rate 116 beats per minutes   In the hospital for foot surgery, this was delayed as she was found to be in atrial fibrillation Admitted to the hospital 07/27/2016 Started on diltiazem, beta blocker, anticoagulation  Back in hospsital 08/10/2016 Hospital records reviewed She was very anxious about palpitations Developed chest discomfort, tightness Medication doses were increased for better rate control Lasix in the emergency room for shortness of breath Also given DuoNeb's  Previous problems with sleep, Bad sleep, dreams  Total  cholesterol 156, LDL 74, creatinine 0.69hematocrit 39  She did not tolerate Lipitor and cholesterol climbed up to 285, LDL 196 On Lipitor total cholesterol 168, LDL 88 in October 2015 No recent lipid panel available on Crestor She is scheduled to have repeat lab work at the end of May 2016 with primary care  She reports having a prior smoking history for 5 years, then smoked only small amounts  PMH:   has a past medical history of Achilles tendinitis; Acute medial meniscal tear; Allergy; Anxiety; Arthritis; Atrial fibrillation ([place]); Chronic lower back pain; COPD (chronic obstructive pulmonary disease) ([place]); Depression; Fibrocystic breast disease; GERD (gastroesophageal reflux disease); Hyperlipidemia; Hypertension; Insomnia; Lipoma; Osteoarthritis; Osteopenia; Tendonitis; Thyroid disease; and Vitamin D deficiency.  PSH:    Past Surgical History:  Procedure Laterality Date  . ABDOMINAL HYSTERECTOMY     ovaries left  . ACHILLES TENDON SURGERY    . BACK SURGERY    . CARDIOVERSION N/A 09/07/2016   Procedure: CARDIOVERSION;  Surgeon: Minna Merritts, MD;  Location: [facility] ORS;  Service: Cardiovascular;  Laterality: N/A;  . COLONOSCOPY    . ESOPHAGOGASTRODUODENOSCOPY    . THYROIDECTOMY     thyroid lobectomy secondary to a benign nodule  . TOTAL THYROIDECTOMY      Current Outpatient Prescriptions  Medication Sig Dispense Refill  . albuterol (PROVENTIL HFA) 108 (90 Base) MCG/ACT inhaler Inhale 2 puffs into the lungs every 6 (six) hours as needed for wheezing or shortness of breath. 1 Inhaler 1  . apixaban (ELIQUIS) 5 MG TABS tablet Take 1 tablet (5 mg total) by mouth 2 (two) times daily. 60 tablet 1  .  B Complex Vitamins (VITAMIN B COMPLEX PO) Take 1 tablet by mouth daily.     . bisoprolol (ZEBETA) 10 MG tablet Take 1 tablet (10 mg total) by mouth 2 (two) times daily. 60 tablet 1  . busPIRone (BUSPAR) 10 MG tablet Take 10 mg by mouth 2 (two) times daily.     Marland Kitchen CALCIUM PO Take 500 mg by  mouth daily.     . Cholecalciferol (VITAMIN D PO) Take 5,000 mg by mouth daily.     Marland Kitchen diltiazem (CARDIZEM CD) 240 MG 24 hr capsule Take 1 capsule (240 mg total) by mouth daily. 90 capsule 3  . diltiazem (CARDIZEM) 30 MG tablet Take 1 tablet (30 mg total) by mouth as needed. Cardizem 30 mg by mouth as needed for palpitations and tachycardia with heart rate greater than 100 30 tablet 0  . DULoxetine (CYMBALTA) 60 MG capsule Take 120 mg by mouth daily.    . flecainide (TAMBOCOR) 100 MG tablet Take 1 tablet (100 mg total) by mouth 2 (two) times daily. 60 tablet 6  . furosemide (LASIX) 20 MG tablet Take 1 tablet (20 mg total) by mouth 2 (two) times daily. 180 tablet 3  . Multiple Vitamin (MULTIVITAMIN) tablet Take 1 tablet by mouth daily.    . NONFORMULARY OR COMPOUNDED ITEM [facility]:  Antiinflammatory Cream - Diclofenac 3%, Baclofen 2%, Cyclobenzaprine 2%, Lidocaine 2%, apply 1-2 grams to affected are 3-4 times daily. 120 each 6  . potassium chloride (K-DUR) 10 MEQ tablet Take 10 mEq by mouth 2 (two) times daily.    . rosuvastatin (CRESTOR) 20 MG tablet TAKE 1 TABLET BY MOUTH DAILY 90 tablet 0  . senna-docusate (SENOKOT-S) 8.6-50 MG tablet Take 1 tablet by mouth at bedtime as needed for mild constipation.     No current facility-administered medications for this visit.      Allergies:   Prednisone; Hydrocodone-acetaminophen; Adhesive [tape]; and Latex   Social History:  The patient  reports that she has quit smoking. Her smoking use included Cigarettes. She has a 2.50 pack-year smoking history. She quit smokeless tobacco use about 15 years ago. She reports that she drinks alcohol. She reports that she does not use drugs.   Family History:   family history includes Breast cancer in her paternal aunt; Cancer in her mother; Cancer (age of onset: 27) in her sister; Cancer (age of onset: 25) in her father; Heart attack (age of onset: 19) in her father; Heart disease in her father; Hyperlipidemia  in her father; Hypertension in her father.    Review of Systems: Review of Systems  Constitutional: Positive for malaise/fatigue.  Respiratory: Positive for shortness of breath.   Cardiovascular: Positive for palpitations.  Gastrointestinal: Negative.   Musculoskeletal: Negative.   Neurological: Positive for weakness.  Psychiatric/Behavioral: Negative.  Insomnia:     All other systems reviewed and are negative.    PHYSICAL EXAM: VS:  BP 100/80 (BP Location: Left Arm, Patient Position: Sitting, Cuff Size: Large)   Pulse (!) 116   Ht 5\' 5"  (1.651 m)   Wt 294 lb 12 oz (133.7 kg)   BMI 49.05 kg/m  , BMI Body mass index is 49.05 kg/m. GEN: Well nourished, well developed, in no acute distress , Morbid obese  HEENT: normal  Neck: no JVD, carotid bruits, or masses Cardiac:Irregularly irregular, tachycardic  no murmurs, rubs, or gallops,no edema  Respiratory:  clear to auscultation bilaterally, normal work of breathing GI: soft, nontender, nondistended, + BS MS: no deformity or  atrophy  Skin: warm and dry, no rash Neuro:  Strength and sensation are intact Psych: euthymic mood, full affect    Recent Labs: 05/12/2016: ALT 19 07/28/2016: TSH 2.373 08/10/2016: B Natriuretic Peptide 278.0 08/11/2016: Hemoglobin 12.4; Magnesium 2.2 08/25/2016: BUN 12; Creatinine, Ser 0.68; Platelets 258; Potassium 4.4; Sodium 143    Lipid Panel Lab Results  Component Value Date   CHOL 126 08/11/2016   HDL 45 08/11/2016   LDLCALC 65 08/11/2016   TRIG 82 08/11/2016      Wt Readings from Last 3 Encounters:  09/20/16 294 lb 12 oz (133.7 kg)  09/07/16 280 lb (127 kg)  08/10/16 280 lb (127 kg)       ASSESSMENT AND PLAN:  Mixed hyperlipidemia Cholesterol is at goal on the current lipid regimen. No changes to the medications were made.  Essential hypertension Blood pressures running low likely from her taking extra diltiazem 30 mg pills in addition to her beta blocker and extended release  diltiazem. She denies any orthostasis symptoms  Atrial fibrillation with RVR ([place]) Long discussion with her today about various treatment options After discussing options, she prefers repeat try at restoring normal sinus rhythm now she feels better in normal rhythm. We will hold the flecainide for 1 week Start digoxin 0.25 mg daily for rate control given her low blood pressure Continue the beta blocker, diltiazem Suggested she start amiodarone in one week 400 mg twice a day for 5 days then 200 mg twice a day Repeat EKG in 3 weeks If she remains in atrial fibrillation we would schedule cardioversion Discussed breast and benefit of the various medications  SOB (shortness of breath) Worsening shortness of breath while in atrial fibrillation. We'll try to restore normal sinus rhythm as above  Morbid obesity ([place]) Despite her efforts, she is unable to lose weight Recommended a strict low carbohydrate diet  Acute pulmonary edema ([place]) She is taking Lasix daily, sometimes twice a day when she goes out to dinner Appears relatively euvolemic on today's visit She takes potassium with her Lasix   Total encounter time more than 45 minutes  Greater than 50% was spent in counseling and coordination of care with the patient   Disposition:   F/U  6 weeks  No orders of the defined types were placed in this encounter.    Signed, Esmond Plants, M.D., Ph.D. 09/20/2016  [place], [place]

## 2016-09-20 ENCOUNTER — Ambulatory Visit: Payer: Medicare Other | Admitting: Cardiovascular Disease

## 2016-09-20 ENCOUNTER — Encounter: Payer: Self-pay | Admitting: Cardiovascular Disease

## 2016-09-20 ENCOUNTER — Ambulatory Visit (INDEPENDENT_AMBULATORY_CARE_PROVIDER_SITE_OTHER): Payer: Medicare Other | Admitting: Cardiovascular Disease

## 2016-09-20 VITALS — BP 100/80 | HR 116 | Ht 65.0 in | Wt 294.8 lb

## 2016-09-20 DIAGNOSIS — I10 Essential (primary) hypertension: Secondary | ICD-10-CM | POA: Diagnosis not present

## 2016-09-20 DIAGNOSIS — R0602 Shortness of breath: Secondary | ICD-10-CM | POA: Diagnosis not present

## 2016-09-20 DIAGNOSIS — I4891 Unspecified atrial fibrillation: Secondary | ICD-10-CM | POA: Diagnosis not present

## 2016-09-20 DIAGNOSIS — E782 Mixed hyperlipidemia: Secondary | ICD-10-CM

## 2016-09-20 DIAGNOSIS — J810 Acute pulmonary edema: Secondary | ICD-10-CM | POA: Diagnosis not present

## 2016-09-20 MED ORDER — DIGOXIN 250 MCG PO TABS: 0.2500 mg | ORAL_TABLET | Freq: Every day | ORAL | 6 refills | Status: DC

## 2016-09-20 MED ORDER — AMIODARONE HCL 200 MG PO TABS: 200.0000 mg | ORAL_TABLET | Freq: Two times a day (BID) | ORAL | 6 refills | Status: DC

## 2016-09-20 NOTE — Patient Instructions (Addendum)
Medication Instructions:  Start digoxin one a day (speed control)  Stop the flecainide (we need a wash out) In 5 days, (Sunday) Start amiodarone 400 mg twice a day for 5 days, Then decrease down to 200 mg twice a day EKG in 2 weeks after starting amiodarone  For tachycardia, take the diltiazem 30 mg pills up to 3 to 4 times a day (6 hour pill)  Labwork:  No new labs needed  Testing/Procedures:  EKG in 3 weeks with nurses  If still in atrial fib, we could set up a cardioversion   I recommend watching educational videos on topics of interest to you at:       [email or web address]  Enter code: [email or web address]    Follow-Up: It was a pleasure seeing you in the office today. Please call us if you have new issues that need to be addressed before your next appt.  [phone]  Your physician wants you to follow-up in: 5 weeks  If you need a refill on your cardiac medications before your next appointment, please call your pharmacy.

## 2016-09-21 ENCOUNTER — Telehealth: Payer: Self-pay | Admitting: *Deleted

## 2016-09-21 ENCOUNTER — Encounter: Payer: Self-pay | Admitting: Family

## 2016-09-21 ENCOUNTER — Ambulatory Visit (INDEPENDENT_AMBULATORY_CARE_PROVIDER_SITE_OTHER): Payer: Medicare Other | Admitting: Family

## 2016-09-21 DIAGNOSIS — F418 Other specified anxiety disorders: Secondary | ICD-10-CM

## 2016-09-21 DIAGNOSIS — I10 Essential (primary) hypertension: Secondary | ICD-10-CM | POA: Diagnosis not present

## 2016-09-21 DIAGNOSIS — F32A Depression, unspecified: Secondary | ICD-10-CM

## 2016-09-21 DIAGNOSIS — F329 Major depressive disorder, single episode, unspecified: Secondary | ICD-10-CM

## 2016-09-21 DIAGNOSIS — F419 Anxiety disorder, unspecified: Secondary | ICD-10-CM

## 2016-09-21 NOTE — Progress Notes (Signed)
Subjective:    Patient ID: Amber Barnes, female    DOB: [birth year], 66 y.o.   MRN: [id number]  CC: Amber Barnes is a 66 y.o. female who presents today for follow up.   HPI: Afib- new. Tried cardioversion twice however afib recurred. No cp, SOB, palpitations.   Has yet to have left achilles surgery. She remains frustrated by left Achilles pain and following with podiatry tomorrow.   Overall she's frustrated because she is continuing to gain weight and she's feeling more tearful. Describes that she is able to fall asleep at night however will wake up during the night. She currently taking her Cymbalta and BuSpar.      HISTORY:  Past Medical History:  Diagnosis Date  . Achilles tendinitis   . Acute medial meniscal tear   . Allergy   . Anxiety   . Arthritis   . Atrial fibrillation ([place])    a. new onset 07/2016; b. CHADS2VASc --> 2 (HTN, female); c. eliquis  . Chronic lower back pain   . COPD (chronic obstructive pulmonary disease) ([place])   . Depression   . Fibrocystic breast disease   . GERD (gastroesophageal reflux disease)   . Hyperlipidemia   . Hypertension   . Insomnia   . Lipoma   . Osteoarthritis    left knee  . Osteopenia   . Tendonitis   . Thyroid disease   . Vitamin D deficiency    Past Surgical History:  Procedure Laterality Date  . ABDOMINAL HYSTERECTOMY     ovaries left  . ACHILLES TENDON SURGERY    . BACK SURGERY    . CARDIOVERSION N/A 09/07/2016   Procedure: CARDIOVERSION;  Surgeon: Minna Merritts, MD;  Location: [facility] ORS;  Service: Cardiovascular;  Laterality: N/A;  . COLONOSCOPY    . ESOPHAGOGASTRODUODENOSCOPY    . THYROIDECTOMY     thyroid lobectomy secondary to a benign nodule  . TOTAL THYROIDECTOMY     Family History  Problem Relation Age of Onset  . Cancer Sister 63    ovarian  . Cancer Father 17    colon  . Heart disease Father   . Heart attack Father 70    died from MI  . Hyperlipidemia Father   . Hypertension Father   .  Cancer Mother     leukemia  . Breast cancer Paternal Aunt     40's    Allergies: Prednisone; Hydrocodone-acetaminophen; Adhesive [tape]; and Latex Current Outpatient Prescriptions on File Prior to Visit  Medication Sig Dispense Refill  . albuterol (PROVENTIL HFA) 108 (90 Base) MCG/ACT inhaler Inhale 2 puffs into the lungs every 6 (six) hours as needed for wheezing or shortness of breath. 1 Inhaler 1  . amiodarone (PACERONE) 200 MG tablet Take 1 tablet (200 mg total) by mouth 2 (two) times daily. 70 tablet 6  . apixaban (ELIQUIS) 5 MG TABS tablet Take 1 tablet (5 mg total) by mouth 2 (two) times daily. 60 tablet 1  . B Complex Vitamins (VITAMIN B COMPLEX PO) Take 1 tablet by mouth daily.     . bisoprolol (ZEBETA) 10 MG tablet Take 1 tablet (10 mg total) by mouth 2 (two) times daily. 60 tablet 1  . busPIRone (BUSPAR) 10 MG tablet Take 10 mg by mouth 2 (two) times daily.     Marland Kitchen CALCIUM PO Take 500 mg by mouth daily.     . Cholecalciferol (VITAMIN D PO) Take 5,000 mg by mouth daily.     . digoxin (LANOXIN)  0.25 MG tablet Take 1 tablet (0.25 mg total) by mouth daily. 30 tablet 6  . diltiazem (CARDIZEM CD) 240 MG 24 hr capsule Take 1 capsule (240 mg total) by mouth daily. 90 capsule 3  . diltiazem (CARDIZEM) 30 MG tablet Take 1 tablet (30 mg total) by mouth as needed. Cardizem 30 mg by mouth as needed for palpitations and tachycardia with heart rate greater than 100 30 tablet 0  . DULoxetine (CYMBALTA) 60 MG capsule Take 120 mg by mouth daily.    . furosemide (LASIX) 20 MG tablet Take 1 tablet (20 mg total) by mouth 2 (two) times daily. 180 tablet 3  . Multiple Vitamin (MULTIVITAMIN) tablet Take 1 tablet by mouth daily.    . NONFORMULARY OR COMPOUNDED ITEM [facility]:  Antiinflammatory Cream - Diclofenac 3%, Baclofen 2%, Cyclobenzaprine 2%, Lidocaine 2%, apply 1-2 grams to affected are 3-4 times daily. 120 each 6  . potassium chloride (K-DUR) 10 MEQ tablet Take 10 mEq by mouth 2 (two) times  daily.    . rosuvastatin (CRESTOR) 20 MG tablet TAKE 1 TABLET BY MOUTH DAILY 90 tablet 0  . senna-docusate (SENOKOT-S) 8.6-50 MG tablet Take 1 tablet by mouth at bedtime as needed for mild constipation.    . [DISCONTINUED] omeprazole (PRILOSEC) 20 MG capsule Take 20 mg by mouth daily.     No current facility-administered medications on file prior to visit.     Social History  Substance Use Topics  . Smoking status: Former Smoker    Packs/day: 0.25    Years: 10.00    Types: Cigarettes  . Smokeless tobacco: Former Systems developer    Quit date: 07/11/2001     Comment: smoked for 10 years  . Alcohol use Yes     Comment: social, occasional    Review of Systems  Constitutional: Negative for chills and fever.  Respiratory: Negative for cough.   Cardiovascular: Negative for chest pain and palpitations.  Gastrointestinal: Negative for nausea and vomiting.  Psychiatric/Behavioral: Positive for sleep disturbance. The patient is nervous/anxious.       Objective:    BP 120/88   Pulse 79   Temp 98.1 F (36.7 C) (Oral)   Ht 5\' 5"  (1.651 m)   Wt 296 lb 3.2 oz (134.4 kg)   SpO2 96%   BMI 49.29 kg/m  BP Readings from Last 3 Encounters:  09/21/16 120/88  09/20/16 100/80  09/07/16 117/64   Wt Readings from Last 3 Encounters:  09/21/16 296 lb 3.2 oz (134.4 kg)  09/20/16 294 lb 12 oz (133.7 kg)  09/07/16 280 lb (127 kg)    Physical Exam  Constitutional: She appears well-developed and well-nourished.  Eyes: Conjunctivae are normal.  Cardiovascular: Normal rate, normal heart sounds and normal pulses.  A regularly irregular rhythm present.  Pulmonary/Chest: Effort normal and breath sounds normal. She has no wheezes. She has no rhonchi. She has no rales.  Neurological: She is alert.  Skin: Skin is warm and dry.  Psychiatric: She has a normal mood and affect. Her speech is normal and behavior is normal. Thought content normal.  Vitals reviewed.      Assessment & Plan:   Problem List Items  Addressed This Visit      Cardiovascular and Mediastinum   Essential hypertension    Well-controlled today. Rate controlled. Patient is following with cardiology.        Other   Anxiety and depression    Long discussion about current regimen. I offered to start another medication  such as Remeron which may help patient with sleep. Patient  Declined and like to stay on the Cymbalta and BuSpar as have worked in the past. She will let me know if she decides she needs medication changes.          I am having Ms. Rayford maintain her CALCIUM PO, Cholecalciferol (VITAMIN D PO), NONFORMULARY OR COMPOUNDED ITEM, DULoxetine, multivitamin, B Complex Vitamins (VITAMIN B COMPLEX PO), busPIRone, albuterol, apixaban, bisoprolol, rosuvastatin, senna-docusate, diltiazem, potassium chloride, diltiazem, furosemide, amiodarone, and digoxin.   No orders of the defined types were placed in this encounter.   Return precautions given.   Risks, benefits, and alternatives of the medications and treatment plan prescribed today were discussed, and patient expressed understanding.   Education regarding symptom management and diagnosis given to patient on AVS.  Continue to follow with [name], FNP for routine health maintenance.   [name] and I agreed with plan.   [name], FNP

## 2016-09-21 NOTE — Patient Instructions (Addendum)
Pleasure seeing you as always. Please let me know if there is anything I can do for you.

## 2016-09-21 NOTE — Telephone Encounter (Signed)
Pharmacist calling from [facility] regarding a drug interaction amiodarone (PACERONE) 200 MG tablet, digoxin (LANOXIN) 0.25 MG tablet and Flecainide 100 mg twice daily.  Please call pharmacist - any pharmacist [phone]

## 2016-09-21 NOTE — Telephone Encounter (Signed)
Spoke w/ pharmacist.  Clarified that Dr. [name] is aware of interactions and has a plan for pt's wash out.  They will go ahead and fill pt's rx.

## 2016-09-21 NOTE — Assessment & Plan Note (Signed)
Long discussion about current regimen. I offered to start another medication such as Remeron which may help patient with sleep. Patient  Declined and like to stay on the Cymbalta and BuSpar as have worked in the past. She will let me know if she decides she needs medication changes.

## 2016-09-21 NOTE — Progress Notes (Signed)
Pre visit review using our clinic review tool, if applicable. No additional management support is needed unless otherwise documented below in the visit note. 

## 2016-09-21 NOTE — Assessment & Plan Note (Addendum)
Well-controlled today. Rate controlled in context of afib. Patient is following with cardiology.

## 2016-09-22 ENCOUNTER — Encounter: Payer: Self-pay | Admitting: Podiatry

## 2016-09-22 ENCOUNTER — Ambulatory Visit (INDEPENDENT_AMBULATORY_CARE_PROVIDER_SITE_OTHER): Payer: Medicare Other | Admitting: Podiatry

## 2016-09-22 ENCOUNTER — Other Ambulatory Visit: Payer: Self-pay | Admitting: Cardiovascular Disease

## 2016-09-22 DIAGNOSIS — M216X2 Other acquired deformities of left foot: Secondary | ICD-10-CM | POA: Diagnosis not present

## 2016-09-22 DIAGNOSIS — M7660 Achilles tendinitis, unspecified leg: Secondary | ICD-10-CM

## 2016-09-22 DIAGNOSIS — M7732 Calcaneal spur, left foot: Secondary | ICD-10-CM

## 2016-09-22 MED ORDER — ACETAMINOPHEN-CODEINE #3 300-30 MG PO TABS: 1.0000 | ORAL_TABLET | Freq: Four times a day (QID) | ORAL | 0 refills | Status: DC | PRN

## 2016-09-22 NOTE — Telephone Encounter (Signed)
Patient was given a Rx at discharge for Potassium by the hospitalist. Does Dr. [name] want to refill. Please advise.

## 2016-09-22 NOTE — Telephone Encounter (Signed)
Spoke with patient. She is taking Potassium 10 mEq (1-2 tablets) by mouth daily as needed when she take furosemide. She received initially from the hospital and last saw Dr [name] on 09/20/16.  Refill sent.

## 2016-09-26 NOTE — Progress Notes (Signed)
Subjective: 65 year old female presents the office today for continued pain to the back of her left heel along prominent heel spurs as well as along the Achilles tendon. She was scheduled for surgery have the day of surgery she was found has atrial  fibrillation. Since that she is follow-up with cardiology. She's had a cardioversion performed and she presents today to discuss surgery however she did go back into A. Fib last night.she does want to talk about the surgery as well as timing and a synovectomy done in the meantime. Denies any systemic complaints such as fevers, chills, nausea, vomiting. No acute changes since last appointment, and no other complaints at this time.   Objective: AAO x3, NAD DP/PT pulses palpable bilaterally, CRT less than 3 seconds On the posterior aspect of the left heel is a prominent retrocalcaneal exostosis with prominence both medial and laterally. There is tenderness mostly on the posterior medial portion of the heel along the prominent heel spur. There is mild erythema from shoe gear irritation. There is trace edema to this area. Equinus is present. There is mild discomfort on the insertion of the Achilles tendon into the calcaneus but there is no pain along the mid substance of the Achilles tendon. There is no pain with lateral compression of the calcaneus. There is no other areas of pinpoint bony tenderness bilaterally. Overall, the exam is unchanged.  No open lesions or pre-ulcerative lesions.  No pain with calf compression, swelling, warmth, erythema  Assessment: Left retrocalcaneal exostosis/insertional Achilles tendinitis  Plan: -All treatment options discussed with the patient including all alternatives, risks, complications.  -At this time a discussed surgery with the patient. She is high risk for surgery to her current uncontrolled atrophic ablation. After discussion we have agreed that we will hold off on surgical intervention until butter controlled. For  now we will start physical therapy to see distal help in the meantime. Continuous shoe changes, offloading, padding.  -Follow-up the next 6 weeks or so. Call any questions or concerns meantime.  Celesta Gentile, DPM

## 2016-09-27 ENCOUNTER — Other Ambulatory Visit: Payer: Self-pay | Admitting: Cardiovascular Disease

## 2016-09-27 ENCOUNTER — Telehealth: Payer: Self-pay | Admitting: Cardiovascular Disease

## 2016-09-27 MED ORDER — APIXABAN 5 MG PO TABS: 5.0000 mg | ORAL_TABLET | Freq: Two times a day (BID) | ORAL | 3 refills | Status: DC

## 2016-09-27 NOTE — Telephone Encounter (Signed)
Refill sent.

## 2016-09-27 NOTE — Telephone Encounter (Signed)
°*  STAT* If patient is at the pharmacy, call can be transferred to refill team.   1. Which medications need to be refilled? (please list name of each medication and dose if known) Eliquis 5 mg po bid   2. Which pharmacy/location (including street and city if local pharmacy) is medication to be sent to? [facility] [address]   3. Do they need a 30 day or 90 day supply? 30 PATIENT WILL BE OUT TOMORROW

## 2016-09-28 ENCOUNTER — Other Ambulatory Visit: Payer: Self-pay | Admitting: Cardiovascular Disease

## 2016-09-28 ENCOUNTER — Telehealth: Payer: Self-pay | Admitting: Cardiovascular Disease

## 2016-09-28 MED ORDER — BISOPROLOL FUMARATE 10 MG PO TABS: 10.0000 mg | ORAL_TABLET | Freq: Two times a day (BID) | ORAL | 3 refills | Status: DC

## 2016-09-28 NOTE — Telephone Encounter (Signed)
°*  STAT* If patient is at the pharmacy, call can be transferred to refill team.   1. Which medications need to be refilled? (please list name of each medication and dose if known)  Bisoprolol   2. Which pharmacy/location (including street and city if local pharmacy) is medication to be sent to? [facility] [address] [facility] [place]   3. Do they need a 30 day or 90 day supply?  30 day

## 2016-09-28 NOTE — Telephone Encounter (Signed)
Refill sent.

## 2016-09-29 ENCOUNTER — Telehealth: Payer: Self-pay | Admitting: Family

## 2016-09-29 ENCOUNTER — Other Ambulatory Visit: Payer: Self-pay

## 2016-09-29 MED ORDER — BISOPROLOL FUMARATE 10 MG PO TABS: 10.0000 mg | ORAL_TABLET | Freq: Two times a day (BID) | ORAL | 3 refills | Status: DC

## 2016-09-29 NOTE — Telephone Encounter (Signed)
Last office visit 09/21/16 No office visit scheduled

## 2016-09-29 NOTE — Telephone Encounter (Signed)
Pt called about needing a refill for medication DULoxetine (CYMBALTA) 60 MG capsule. Pt states that [name] spoke to pt about giving her the refill.   [facility]acy is [facility] [place], [place]  Call pt @ [phone]. Thank you!

## 2016-09-30 ENCOUNTER — Other Ambulatory Visit: Payer: Self-pay

## 2016-09-30 MED ORDER — DULOXETINE HCL 60 MG PO CPEP: 120.0000 mg | ORAL_CAPSULE | Freq: Every day | ORAL | 2 refills | Status: DC

## 2016-09-30 NOTE — Telephone Encounter (Signed)
Medication has been refilled.

## 2016-09-30 NOTE — Telephone Encounter (Signed)
Okay to refill cymbalta - thanks

## 2016-10-11 ENCOUNTER — Ambulatory Visit (INDEPENDENT_AMBULATORY_CARE_PROVIDER_SITE_OTHER): Payer: Medicare Other

## 2016-10-11 VITALS — BP 126/81 | HR 71 | Ht 65.0 in | Wt 298.0 lb

## 2016-10-11 DIAGNOSIS — I4891 Unspecified atrial fibrillation: Secondary | ICD-10-CM

## 2016-10-11 NOTE — Patient Instructions (Addendum)
You are scheduled for a Cardioversion on Wednesday, April 11 with Dr. [name].  Please arrive at the [facility] at 6:30 a.m. on the day of your procedure.  DIET INSTRUCTIONS:  Nothing to eat or drink after midnight except your medications with a          sip of water.    1) Labs: BMET, CBC, PT/INR  2) Medications:  YOU MAY TAKE ALL of your remaining medications with a small amount of water.  3) Must have a responsible person to drive you home.  4) Bring a current list of your medications and current insurance cards.   If you have any questions after you get home, please call the office at [phone]  Electrical Cardioversion Electrical cardioversion is the delivery of a jolt of electricity to restore a normal rhythm to the heart. A rhythm that is too fast or is not regular keeps the heart from pumping well. In this procedure, sticky patches or metal paddles are placed on the chest to deliver electricity to the heart from a device. This procedure may be done in an emergency if:  There is low or no blood pressure as a result of the heart rhythm.  Normal rhythm must be restored as fast as possible to protect the brain and heart from further damage.  It may save a life. This procedure may also be done for irregular or fast heart rhythms that are not immediately life-threatening. Tell a health care provider about:  Any allergies you have.  All medicines you are taking, including vitamins, herbs, eye drops, creams, and over-the-counter medicines.  Any problems you or family members have had with anesthetic medicines.  Any blood disorders you have.  Any surgeries you have had.  Any medical conditions you have.  Whether you are pregnant or may be pregnant. What are the risks? Generally, this is a safe procedure. However, problems may occur, including:  Allergic reactions to medicines.  A blood clot that breaks free and travels to other parts of your body.  The  possible return of an abnormal heart rhythm within hours or days after the procedure.  Your heart stopping (cardiac arrest). This is rare. What happens before the procedure? Medicines   Your health care provider may have you start taking:  Blood-thinning medicines (anticoagulants) so your blood does not clot as easily.  Medicines may be given to help stabilize your heart rate and rhythm.  Ask your health care provider about changing or stopping your regular medicines. This is especially important if you are taking diabetes medicines or blood thinners. General instructions   Plan to have someone take you home from the hospital or clinic.  If you will be going home right after the procedure, plan to have someone with you for 24 hours.  Follow instructions from your health care provider about eating or drinking restrictions. What happens during the procedure?  To lower your risk of infection:  Your health care team will wash or sanitize their hands.  Your skin will be washed with soap.  An IV tube will be inserted into one of your veins.  You will be given a medicine to help you relax (sedative).  Sticky patches (electrodes) or metal paddles may be placed on your chest.  An electrical shock will be delivered. The procedure may vary among health care providers and hospitals. What happens after the procedure?  Your blood pressure, heart rate, breathing rate, and blood oxygen level will be monitored  until the medicines you were given have worn off.  Do not drive for 24 hours if you were given a sedative.  Your heart rhythm will be watched to make sure it does not change. This information is not intended to replace advice given to you by your health care provider. Make sure you discuss any questions you have with your health care provider. Document Released: 06/17/2002 Document Revised: 02/24/2016 Document Reviewed: 01/01/2016 Elsevier Interactive Patient Education  2017  [employer].  Electrical Cardioversion, Care After This sheet gives you information about how to care for yourself after your procedure. Your health care provider may also give you more specific instructions. If you have problems or questions, contact your health care provider. What can I expect after the procedure? After the procedure, it is common to have:  Some redness on the skin where the shocks were given. Follow these instructions at home:  Do not drive for 24 hours if you were given a medicine to help you relax (sedative).  Take over-the-counter and prescription medicines only as told by your health care provider.  Ask your health care provider how to check your pulse. Check it often.  Rest for 48 hours after the procedure or as told by your health care provider.  Avoid or limit your caffeine use as told by your health care provider. Contact a health care provider if:  You feel like your heart is beating too quickly or your pulse is not regular.  You have a serious muscle cramp that does not go away. Get help right away if:  You have discomfort in your chest.  You are dizzy or you feel faint.  You have trouble breathing or you are short of breath.  Your speech is slurred.  You have trouble moving an arm or leg on one side of your body.  Your fingers or toes turn cold or blue. This information is not intended to replace advice given to you by your health care provider. Make sure you discuss any questions you have with your health care provider. Document Released: 04/17/2013 Document Revised: 01/29/2016 Document Reviewed: 01/01/2016 Elsevier Interactive Patient Education  2017 [employer].

## 2016-10-11 NOTE — Progress Notes (Signed)
1.) Reason for visit: EKG  2.) Name of MD requesting visit: Dr. [name]  3.) H&P: Pt was advised at ov 09/20/16:    Start digoxin one a day (speed control)  Stop the flecainide (we need a wash out) In 5 days, (Sunday) Start amiodarone 400 mg twice a day for 5 days, Then decrease down to 200 mg twice a day EKG in 2 weeks after starting amiodarone  4.) ROS related to problem: Pt reports continued SOB.  She felt that she converted to NSR on Thurs & Fri last week but it only lasted a couple of days. She felt good during that time and would like to feel that way again.  She reports that she fell this weekend, she got tripped over a dog kennel, but did not get hurt.  She is sched for foot surgery, but they will not do surgery until she is out of afib.   5.) Assessment and plan per MD: Pt sched for DCCV next Wed 4/11 @ 7:30.  Pre-procedure labs drawn today.

## 2016-10-12 LAB — BASIC METABOLIC PANEL
BUN/Creatinine Ratio: 15 (ref 12–28)
BUN: 11 mg/dL (ref 8–27)
CO2: 23 mmol/L (ref 18–29)
Calcium: 8.9 mg/dL (ref 8.7–10.3)
Chloride: 101 mmol/L (ref 96–106)
Creatinine, Ser: 0.73 mg/dL (ref 0.57–1.00)
GFR calc Af Amer: 101 mL/min/{1.73_m2} (ref >59)
GFR calc non Af Amer: 88 mL/min/{1.73_m2} (ref >59)
Glucose: 147 mg/dL — ABNORMAL HIGH (ref 65–99)
Potassium: 3.8 mmol/L (ref 3.5–5.2)
Sodium: 143 mmol/L (ref 134–144)

## 2016-10-12 LAB — CBC WITH DIFFERENTIAL/PLATELET
Basophils Absolute: 0 10*3/uL (ref 0.0–0.2)
Basos: 1 %
EOS (ABSOLUTE): 0.1 10*3/uL (ref 0.0–0.4)
Eos: 1 %
Hematocrit: 37.7 % (ref 34.0–46.6)
Hemoglobin: 12.9 g/dL (ref 11.1–15.9)
Immature Grans (Abs): 0 10*3/uL (ref 0.0–0.1)
Immature Granulocytes: 0 %
Lymphocytes Absolute: 1.6 10*3/uL (ref 0.7–3.1)
Lymphs: 27 %
MCH: 29.3 pg (ref 26.6–33.0)
MCHC: 34.2 g/dL (ref 31.5–35.7)
MCV: 86 fL (ref 79–97)
Monocytes Absolute: 0.4 10*3/uL (ref 0.1–0.9)
Monocytes: 6 %
Neutrophils Absolute: 4 10*3/uL (ref 1.4–7.0)
Neutrophils: 65 %
Platelets: 227 10*3/uL (ref 150–379)
RBC: 4.41 x10E6/uL (ref 3.77–5.28)
RDW: 14.8 % (ref 12.3–15.4)
WBC: 6.2 10*3/uL (ref 3.4–10.8)

## 2016-10-12 LAB — PROTIME-INR
INR: 1.1 (ref 0.8–1.2)
Prothrombin Time: 11.3 s (ref 9.1–12.0)

## 2016-10-18 ENCOUNTER — Other Ambulatory Visit: Payer: Self-pay

## 2016-10-18 ENCOUNTER — Telehealth: Payer: Self-pay | Admitting: Cardiovascular Disease

## 2016-10-18 MED ORDER — DIGOXIN 250 MCG PO TABS: 0.2500 mg | ORAL_TABLET | Freq: Every day | ORAL | 6 refills | Status: DC

## 2016-10-18 NOTE — Telephone Encounter (Signed)
Requested Prescriptions   Signed Prescriptions Disp Refills  . digoxin (LANOXIN) 0.25 MG tablet 30 tablet 6    Sig: Take 1 tablet (0.25 mg total) by mouth daily.    Authorizing Provider: Minna Merritts    Ordering User: Janan Ridge

## 2016-10-18 NOTE — Telephone Encounter (Signed)
Spoke w/ pharmacist.  They have switched manufacturers of pt's digoxin and cannot fill unless they notify prescriber. She will fill rx now.Marland Kitchen

## 2016-10-18 NOTE — Telephone Encounter (Signed)
Pharmacist has a question regarding Digoxin. Please call

## 2016-10-19 ENCOUNTER — Encounter
Admission: RE | Disposition: A | Discharge status: Home / Self Care | Payer: Self-pay | Source: Ambulatory Visit | Attending: Cardiovascular Disease

## 2016-10-19 ENCOUNTER — Ambulatory Visit
Admission: RE | Admit: 2016-10-19 | Discharge: 2016-10-19 | Disposition: A | Discharge status: Home / Self Care | Payer: Medicare Other | Source: Ambulatory Visit | Attending: Cardiovascular Disease | Admitting: Cardiovascular Disease

## 2016-10-19 ENCOUNTER — Other Ambulatory Visit: Payer: Self-pay | Admitting: Cardiovascular Disease

## 2016-10-19 ENCOUNTER — Ambulatory Visit: Payer: Medicare Other | Admitting: Anesthesiology

## 2016-10-19 ENCOUNTER — Encounter: Payer: Self-pay | Admitting: *Deleted

## 2016-10-19 DIAGNOSIS — J449 Chronic obstructive pulmonary disease, unspecified: Secondary | ICD-10-CM | POA: Diagnosis not present

## 2016-10-19 DIAGNOSIS — E785 Hyperlipidemia, unspecified: Secondary | ICD-10-CM | POA: Diagnosis not present

## 2016-10-19 DIAGNOSIS — Z7901 Long term (current) use of anticoagulants: Secondary | ICD-10-CM | POA: Insufficient documentation

## 2016-10-19 DIAGNOSIS — R0602 Shortness of breath: Secondary | ICD-10-CM

## 2016-10-19 DIAGNOSIS — Z6841 Body Mass Index (BMI) 40.0 and over, adult: Secondary | ICD-10-CM | POA: Insufficient documentation

## 2016-10-19 DIAGNOSIS — I481 Persistent atrial fibrillation: Secondary | ICD-10-CM | POA: Diagnosis present

## 2016-10-19 DIAGNOSIS — F419 Anxiety disorder, unspecified: Secondary | ICD-10-CM | POA: Diagnosis not present

## 2016-10-19 DIAGNOSIS — Z79899 Other long term (current) drug therapy: Secondary | ICD-10-CM | POA: Diagnosis not present

## 2016-10-19 DIAGNOSIS — F329 Major depressive disorder, single episode, unspecified: Secondary | ICD-10-CM | POA: Insufficient documentation

## 2016-10-19 DIAGNOSIS — I10 Essential (primary) hypertension: Secondary | ICD-10-CM | POA: Diagnosis not present

## 2016-10-19 DIAGNOSIS — E559 Vitamin D deficiency, unspecified: Secondary | ICD-10-CM | POA: Diagnosis not present

## 2016-10-19 HISTORY — PX: CARDIOVERSION: EP1203

## 2016-10-19 SURGERY — CARDIOVERSION (CATH LAB)
Anesthesia: General

## 2016-10-19 MED ORDER — SODIUM CHLORIDE 0.9 % IV SOLN
INTRAVENOUS | Status: DC | PRN
  Administered 2016-10-19: 07:00:00 via INTRAVENOUS

## 2016-10-19 MED ORDER — PROPOFOL 10 MG/ML IV BOLUS
INTRAVENOUS | Status: DC | PRN
  Administered 2016-10-19: 50 mg via INTRAVENOUS

## 2016-10-19 MED ORDER — PROPOFOL 10 MG/ML IV BOLUS
INTRAVENOUS | Status: AC
  Filled 2016-10-19: qty 20

## 2016-10-19 NOTE — H&P (Signed)
H&P Addendum, pre-cardioversion for atrial fibrillation  Patient was seen and evaluated prior to -cardioversion procedure Symptoms, prior testing details again confirmed with the patient Patient examined, no significant change from prior exam Lab work reviewed in detail personally by myself Patient understands risk and benefit of the procedure, willing to proceed  Signed, Esmond Plants, MD, Ph.D [facility]

## 2016-10-19 NOTE — Anesthesia Postprocedure Evaluation (Signed)
Anesthesia Post Note  Patient: Pacey Willadsen  Procedure(s) Performed: Procedure(s) (LRB): CARDIOVERSION (N/A)  Patient location during evaluation: PACU Anesthesia Type: General Level of consciousness: awake and alert Pain management: pain level controlled Vital Signs Assessment: post-procedure vital signs reviewed and stable Respiratory status: spontaneous breathing, nonlabored ventilation, respiratory function stable and patient connected to nasal cannula oxygen Cardiovascular status: blood pressure returned to baseline and stable Postop Assessment: no signs of nausea or vomiting Anesthetic complications: no     Last Vitals:  Vitals:   10/19/16 0805 10/19/16 0810  BP:    Pulse: (!) 45 (!) 45  Resp: 16 15  Temp:      Last Pain:  Vitals:   10/19/16 0646  TempSrc: Oral                 Martha Clan

## 2016-10-19 NOTE — Anesthesia Preprocedure Evaluation (Signed)
Anesthesia Evaluation  Patient identified by MRN, date of birth, ID band Patient awake    Reviewed: Allergy & Precautions, H&P , NPO status , Patient's Chart, lab work & pertinent test results, reviewed documented beta blocker date and time   History of Anesthesia Complications Negative for: history of anesthetic complications  Airway Mallampati: IV  TM Distance: >3 FB Neck ROM: full    Dental  (+) Missing   Pulmonary shortness of breath and with exertion, neg sleep apnea, COPD, neg recent URI, former smoker,           Cardiovascular Exercise Tolerance: Good hypertension, (-) angina(-) CAD, (-) Past MI, (-) Cardiac Stents and (-) CABG + dysrhythmias Atrial Fibrillation (-) Valvular Problems/Murmurs     Neuro/Psych PSYCHIATRIC DISORDERS (Depression and anxiety) negative neurological ROS     GI/Hepatic Neg liver ROS, GERD  ,  Endo/Other  neg diabetesMorbid obesity  Renal/GU negative Renal ROS  negative genitourinary   Musculoskeletal   Abdominal   Peds  Hematology negative hematology ROS (+)   Anesthesia Other Findings Past Medical History: No date: Achilles tendinitis No date: Acute medial meniscal tear No date: Allergy No date: Anxiety No date: Arthritis No date: Atrial fibrillation [facility])     Comment: a. new onset 07/2016; b. CHADS2VASc --> 2 (HTN,              female); c. eliquis No date: Chronic lower back pain No date: COPD (chronic obstructive pulmonary disease) (* No date: Depression No date: Fibrocystic breast disease No date: GERD (gastroesophageal reflux disease) No date: Hyperlipidemia No date: Hypertension No date: Insomnia No date: Lipoma No date: Osteoarthritis     Comment: left knee No date: Osteopenia No date: Tendonitis No date: Thyroid disease No date: Vitamin D deficiency   Reproductive/Obstetrics negative OB ROS                             Anesthesia  Physical  Anesthesia Plan  ASA: III  Anesthesia Plan: General   Post-op Pain Management:    Induction:   Airway Management Planned:   Additional Equipment:   Intra-op Plan:   Post-operative Plan:   Informed Consent: I have reviewed the patients History and Physical, chart, labs and discussed the procedure including the risks, benefits and alternatives for the proposed anesthesia with the patient or authorized representative who has indicated his/her understanding and acceptance.   Dental Advisory Given  Plan Discussed with: Anesthesiologist, CRNA and Surgeon  Anesthesia Plan Comments:         Anesthesia Quick Evaluation

## 2016-10-19 NOTE — Transfer of Care (Signed)
Immediate Anesthesia Transfer of Care Note  Patient: Amber Barnes  Procedure(s) Performed: Procedure(s): CARDIOVERSION (N/A)  Patient Location: PACU  Anesthesia Type:General  Level of Consciousness: awake and sedated  Airway & Oxygen Therapy: Patient Spontanous Breathing and Patient connected to nasal cannula oxygen  Post-op Assessment: Report given to RN and Post -op Vital signs reviewed and stable  Post vital signs: Reviewed and stable  Last Vitals:  Vitals:   10/19/16 0646  BP: 132/82  Pulse: 70  Resp: 14  Temp: 36.8 C    Last Pain:  Vitals:   10/19/16 0646  TempSrc: Oral         Complications: No apparent anesthesia complications

## 2016-10-19 NOTE — CV Procedure (Signed)
Cardioversion procedure note For atrial fibrillation, persistent  Procedure Details:  Consent: Risks of procedure as well as the alternatives and risks of each were explained to the (patient/caregiver). Consent for procedure obtained.  Time Out: Verified patient identification, verified procedure, site/side was marked, verified correct patient position, special equipment/implants available, medications/allergies/relevent history reviewed, required imaging and test results available. Performed  Patient placed on cardiac monitor, pulse oximetry, supplemental oxygen as necessary.  Sedation given: propofol IV, Dr. Rosey Bath Pacer pads placed anterior and posterior chest.   Cardioverted 1 time(s).  Cardioverted at  150 J. Synchronized biphasic Converted to NSR    Evaluation: Findings: Post procedure EKG shows: NSR, bradycardia Complications: None Patient did tolerate procedure well.  Time Spent Directly with the Patient:  17 minutes   Esmond Plants, M.D., Ph.D.

## 2016-10-19 NOTE — Anesthesia Procedure Notes (Signed)
Date/Time: 10/19/2016 7:29 AM Performed by: Nelda Marseille Pre-anesthesia Checklist: Patient identified, Emergency Drugs available, Suction available, Patient being monitored and Timeout performed Oxygen Delivery Method: Nasal cannula

## 2016-10-19 NOTE — Discharge Instructions (Signed)
Electrical Cardioversion, Care After °This sheet gives you information about how to care for yourself after your procedure. Your health care provider may also give you more specific instructions. If you have problems or questions, contact your health care provider. °What can I expect after the procedure? °After the procedure, it is common to have: °· Some redness on the skin where the shocks were given. °Follow these instructions at home: °· Do not drive for 24 hours if you were given a medicine to help you relax (sedative). °· Take over-the-counter and prescription medicines only as told by your health care provider. °· Ask your health care provider how to check your pulse. Check it often. °· Rest for 48 hours after the procedure or as told by your health care provider. °· Avoid or limit your caffeine use as told by your health care provider. °Contact a health care provider if: °· You feel like your heart is beating too quickly or your pulse is not regular. °· You have a serious muscle cramp that does not go away. °Get help right away if: °· You have discomfort in your chest. °· You are dizzy or you feel faint. °· You have trouble breathing or you are short of breath. °· Your speech is slurred. °· You have trouble moving an arm or leg on one side of your body. °· Your fingers or toes turn cold or blue. °This information is not intended to replace advice given to you by your health care provider. Make sure you discuss any questions you have with your health care provider. °Document Released: 04/17/2013 Document Revised: 01/29/2016 Document Reviewed: 01/01/2016 °Elsevier Interactive Patient Education © 2017 [employer] ° °

## 2016-10-19 NOTE — Anesthesia Post-op Follow-up Note (Cosign Needed)
Anesthesia QCDR form completed.        

## 2016-10-24 NOTE — Progress Notes (Signed)
Cardiology Office Note  Date:  10/25/2016   ID:  Amber Barnes, DOB 08-13-1950, MRN [id number]  PCP:  [name], FNP   Chief Complaint  Patient presents with  . other    5wk f/u. Pt c/o some sob and feeling tired. Reviewed meds with pt verbally.    HPI:  Amber Barnes is a 66 year-old woman with history of  Persistent atrial fibrillation obesity,  arthritis,  hyperlipidemia,  hypertension,  Stress test January 2018 showing no ischemia, Ejection fraction 55-65% who presents for follow-up of her hyperlipidemia, hypertension, persistent atrial fibrillation   cardioversion procedure 09/07/2016 Normal sinus rhythm restored at that time Back into atrial fibrillation on last hospital visit March 13 Harder to walk,  SOB with exertion Weight up 10 pounds since 04/2016  Flecainide held, After washout started on amiodarone Repeat cardioversion 10/19/2016 Normal sinus rhythm restored  She feels well on today's visit, some fatigue She has been doing more of her activities which she could not do before when she was in atrial fibrillation such as working in the kitchen  would like to have left foot surgery as soon as possible We have recommended May 10  EKG personally reviewed by myself today showing  NSR rate 48   called past medical history reviewed  In the hospital for foot surgery, this was delayed as she was found to be in atrial fibrillation Admitted to the hospital 07/27/2016 Started on diltiazem, beta blocker, anticoagulation  Back in hospsital 08/10/2016 Hospital records reviewed She was very anxious about palpitations Developed chest discomfort, tightness Medication doses were increased for better rate control Lasix in the emergency room for shortness of breath Also given DuoNeb's  Previous problems with sleep, Bad sleep, dreams  Total cholesterol 156, LDL 74, creatinine 0.69hematocrit 39  She did not tolerate Lipitor and cholesterol climbed up to 285, LDL  196 On Lipitor total cholesterol 168, LDL 88 in October 2015 No recent lipid panel available on Crestor She is scheduled to have repeat lab work at the end of May 2016 with primary care  She reports having a prior smoking history for 5 years, then smoked only small amounts  PMH:   has a past medical history of Achilles tendinitis; Acute medial meniscal tear; Allergy; Anxiety; Arthritis; Atrial fibrillation ([place]); Chronic lower back pain; COPD (chronic obstructive pulmonary disease) ([place]); Depression; Fibrocystic breast disease; GERD (gastroesophageal reflux disease); Hyperlipidemia; Hypertension; Insomnia; Lipoma; Osteoarthritis; Osteopenia; Tendonitis; Thyroid disease; and Vitamin D deficiency.  PSH:    Past Surgical History:  Procedure Laterality Date  . ABDOMINAL HYSTERECTOMY     ovaries left  . ACHILLES TENDON SURGERY    . BACK SURGERY    . CARDIOVERSION N/A 09/07/2016   Procedure: CARDIOVERSION;  Surgeon: Minna Merritts, MD;  Location: [facility] ORS;  Service: Cardiovascular;  Laterality: N/A;  . CARDIOVERSION N/A 10/19/2016   Procedure: CARDIOVERSION;  Surgeon: Minna Merritts, MD;  Location: [facility] ORS;  Service: Cardiovascular;  Laterality: N/A;  . COLONOSCOPY    . ESOPHAGOGASTRODUODENOSCOPY    . THYROIDECTOMY     thyroid lobectomy secondary to a benign nodule    Current Outpatient Prescriptions  Medication Sig Dispense Refill  . acetaminophen-codeine (TYLENOL #3) 300-30 MG tablet Take 1-2 tablets by mouth every 6 (six) hours as needed for moderate pain. 30 tablet 0  . amiodarone (PACERONE) 200 MG tablet Take 1 tablet (200 mg total) by mouth 2 (two) times daily. 70 tablet 6  . apixaban (ELIQUIS) 5 MG TABS tablet Take 1  tablet (5 mg total) by mouth 2 (two) times daily. 60 tablet 3  . B Complex Vitamins (VITAMIN B COMPLEX PO) Take 1 tablet by mouth daily.     . bisoprolol (ZEBETA) 10 MG tablet Take 1 tablet (10 mg total) by mouth 2 (two) times daily. 60 tablet 3  . busPIRone  (BUSPAR) 10 MG tablet Take 10 mg by mouth 2 (two) times daily.     Marland Kitchen CALCIUM PO Take 500 mg by mouth daily.     . Cholecalciferol (VITAMIN D PO) Take 5,000 mg by mouth daily.     . digoxin (LANOXIN) 0.25 MG tablet Take 1 tablet (0.25 mg total) by mouth daily. 30 tablet 6  . diltiazem (CARDIZEM CD) 240 MG 24 hr capsule Take 1 capsule (240 mg total) by mouth daily. 90 capsule 3  . diltiazem (CARDIZEM) 30 MG tablet Take 1 tablet (30 mg total) by mouth as needed. Cardizem 30 mg by mouth as needed for palpitations and tachycardia with heart rate greater than 100 30 tablet 0  . DULoxetine (CYMBALTA) 60 MG capsule Take 2 capsules (120 mg total) by mouth daily. 180 capsule 2  . furosemide (LASIX) 20 MG tablet Take 1 tablet (20 mg total) by mouth 2 (two) times daily. 180 tablet 3  . lovastatin (MEVACOR) 20 MG tablet Take 20 mg by mouth every morning.    . Multiple Vitamin (MULTIVITAMIN) tablet Take 1 tablet by mouth daily.    . NONFORMULARY OR COMPOUNDED ITEM Apply 1 application topically daily as needed. Anti-inflammatory Pain Cream - Diclofenac 3%, Baclofen 2%, Cyclobenzaprine 2%, Lidocaine 2%, apply 1-2 grams to affected area daily as needed    . oxymetazoline (AFRIN) 0.05 % nasal spray Place 1 spray into both nostrils 2 (two) times daily as needed for congestion.    . potassium chloride (KLOR-CON 10) 10 MEQ tablet Take 1 tablet (10 mEq total) by mouth 2 (two) times daily. 60 tablet 6  . rosuvastatin (CRESTOR) 20 MG tablet Take 20 mg by mouth daily.    . sennosides-docusate sodium (SENOKOT-S) 8.6-50 MG tablet Take 1 tablet by mouth daily as needed for constipation.    Marland Kitchen CARTIA XT 120 MG 24 hr capsule Take 120 mg by mouth daily.     No current facility-administered medications for this visit.      Allergies:   Prednisone; Hydrocodone-acetaminophen; Adhesive [tape]; and Latex   Social History:  The patient  reports that she has quit smoking. Her smoking use included Cigarettes. She has a 2.50  pack-year smoking history. She quit smokeless tobacco use about 15 years ago. She reports that she drinks alcohol. She reports that she does not use drugs.   Family History:   family history includes Breast cancer in her paternal aunt; Cancer in her mother; Cancer (age of onset: 10) in her sister; Cancer (age of onset: 64) in her father; Heart attack (age of onset: 59) in her father; Heart disease in her father; Hyperlipidemia in her father; Hypertension in her father.    Review of Systems: Review of Systems  Constitutional: Positive for malaise/fatigue.  Respiratory: Negative.   Cardiovascular: Negative.   Gastrointestinal: Negative.   Musculoskeletal: Negative.   Psychiatric/Behavioral: The patient has insomnia ( none).   All other systems reviewed and are negative.    PHYSICAL EXAM: VS:  BP (!) 152/78 (BP Location: Left Arm, Patient Position: Sitting, Cuff Size: Large)   Pulse (!) 48   Ht 5\' 5"  (1.651 m)   Wt 298 lb  8 oz (135.4 kg)   BMI 49.67 kg/m  , BMI Body mass index is 49.67 kg/m.  GEN: Well nourished, well developed, in no acute distress , Morbid obese  HEENT: normal  Neck: no JVD, carotid bruits, or masses Cardiac:Irregularly irregular, tachycardic  no murmurs, rubs, or gallops,no edema  Respiratory:  clear to auscultation bilaterally, normal work of breathing GI: soft, nontender, nondistended, + BS MS: no deformity or atrophy  Skin: warm and dry, no rash Neuro:  Strength and sensation are intact Psych: euthymic mood, full affect    Recent Labs: 05/12/2016: ALT 19 07/28/2016: TSH 2.373 08/10/2016: B Natriuretic Peptide 278.0 08/11/2016: Hemoglobin 12.4; Magnesium 2.2 10/11/2016: BUN 11; Creatinine, Ser 0.73; Platelets 227; Potassium 3.8; Sodium 143    Lipid Panel Lab Results  Component Value Date   CHOL 126 08/11/2016   HDL 45 08/11/2016   LDLCALC 65 08/11/2016   TRIG 82 08/11/2016      Wt Readings from Last 3 Encounters:  10/25/16 298 lb 8 oz (135.4 kg)   10/19/16 298 lb (135.2 kg)  10/11/16 298 lb (135.2 kg)       ASSESSMENT AND PLAN:   Mixed hyperlipidemia Cholesterol is at goal on the current lipid regimen. No changes to the medications were made.  Essential hypertension Blood pressure is well controlled on today's visit. No changes made to the medications.  Atrial fibrillation with RVR (HCC) Recommended that she decrease amiodarone down to 200 mg daily We will hold the digoxin She could increase amiodarone up to 200 mg daily through the perioperative period for her foot surgery Hold anticoagulation 2 days prior to surgery No further testing needed  SOB (shortness of breath) Improved shortness of breath with restoring normal sinus rhythm  Morbid obesity ([place]) Despite her efforts, she is unable to lose weight Recommended a strict low carbohydrate diet  Acute pulmonary edema ([place]) She is taking Lasix daily, sometimes twice a day when she goes out to dinner Some mild swelling on today's visit and blood pressure borderline elevated   Total encounter time more than 25 minutes  Greater than 50% was spent in counseling and coordination of care with the patient    Disposition:   F/U  6 weeks   Orders Placed This Encounter  Procedures  . EKG 12-Lead     Signed, Esmond Plants, M.D., Ph.D. 10/25/2016  [place], [place]

## 2016-10-25 ENCOUNTER — Encounter: Payer: Self-pay | Admitting: Cardiovascular Disease

## 2016-10-25 ENCOUNTER — Ambulatory Visit (INDEPENDENT_AMBULATORY_CARE_PROVIDER_SITE_OTHER): Payer: Medicare Other | Admitting: Cardiovascular Disease

## 2016-10-25 VITALS — BP 152/78 | HR 48 | Ht 65.0 in | Wt 298.5 lb

## 2016-10-25 DIAGNOSIS — R0602 Shortness of breath: Secondary | ICD-10-CM | POA: Diagnosis not present

## 2016-10-25 DIAGNOSIS — E782 Mixed hyperlipidemia: Secondary | ICD-10-CM | POA: Diagnosis not present

## 2016-10-25 DIAGNOSIS — I10 Essential (primary) hypertension: Secondary | ICD-10-CM | POA: Diagnosis not present

## 2016-10-25 DIAGNOSIS — I4891 Unspecified atrial fibrillation: Secondary | ICD-10-CM | POA: Diagnosis not present

## 2016-10-25 DIAGNOSIS — J810 Acute pulmonary edema: Secondary | ICD-10-CM | POA: Diagnosis not present

## 2016-10-25 NOTE — Patient Instructions (Addendum)
Medication Instructions:   Please decrease the amiodarone down to one a day Hold the digoxin  Hold eliquis 2 days before foot surgery Foot surgery May 11 th or after  Consider increasing amiodarone back to 2 a day week before surgery and couple days after   Labwork:  No new labs needed  Testing/Procedures:  No further testing at this time   I recommend watching educational videos on topics of interest to you at:       [email or web address].[email or web address]  Enter code: [email or web address]    Follow-Up: It was a pleasure seeing you in the office today. Please call us if you have new issues that need to be addressed before your next appt.  [phone]  Your physician wants you to follow-up in: 6 months.  You will receive a reminder letter in the mail two months in advance. If you don't receive a letter, please call our office to schedule the follow-up appointment.  If you need a refill on your cardiac medications before your next appointment, please call your pharmacy.

## 2016-10-27 ENCOUNTER — Telehealth: Payer: Self-pay | Admitting: Cardiovascular Disease

## 2016-10-27 NOTE — Telephone Encounter (Signed)
Patient says pharmacy needs PA for Buspar 10 mg po BID

## 2016-10-30 ENCOUNTER — Other Ambulatory Visit: Payer: Self-pay | Admitting: Family

## 2016-10-31 ENCOUNTER — Other Ambulatory Visit: Payer: Self-pay

## 2016-10-31 ENCOUNTER — Encounter: Payer: Self-pay | Admitting: Family

## 2016-10-31 ENCOUNTER — Encounter: Payer: Self-pay | Admitting: Cardiovascular Disease

## 2016-10-31 MED ORDER — BUSPIRONE HCL 10 MG PO TABS: 10.0000 mg | ORAL_TABLET | Freq: Two times a day (BID) | ORAL | 1 refills | Status: DC

## 2016-10-31 NOTE — Telephone Encounter (Signed)
Medication has been refilled.

## 2016-10-31 NOTE — Telephone Encounter (Signed)
Pt called to follow up on her refill. Pt states she only has one for today. Please advise? Thank you!

## 2016-11-03 ENCOUNTER — Ambulatory Visit: Payer: Medicare Other | Admitting: Podiatry

## 2016-11-04 ENCOUNTER — Telehealth: Payer: Self-pay

## 2016-11-04 DIAGNOSIS — K760 Fatty (change of) liver, not elsewhere classified: Secondary | ICD-10-CM

## 2016-11-04 NOTE — Telephone Encounter (Signed)
Patient would like to have her crestor refilled but it was discontinued.  Please advise.

## 2016-11-07 MED ORDER — ROSUVASTATIN CALCIUM 20 MG PO TABS: 20.0000 mg | ORAL_TABLET | Freq: Every day | ORAL | 1 refills | Status: DC

## 2016-11-07 NOTE — Addendum Note (Signed)
Addended by: [name] on: 11/07/2016 08:33 AM   Modules accepted: Orders

## 2016-11-07 NOTE — Telephone Encounter (Signed)
Call pt  Let her know I sent in 20mg  crestor.   I considered decreasing to 10mg  as her LDL is at goal ( < 70).   If she would like to try the smaller dose, just let me know and we can resend

## 2016-11-10 ENCOUNTER — Encounter: Payer: Self-pay | Admitting: Cardiovascular Disease

## 2016-11-14 ENCOUNTER — Ambulatory Visit (INDEPENDENT_AMBULATORY_CARE_PROVIDER_SITE_OTHER): Payer: Medicare Other | Admitting: *Deleted

## 2016-11-14 VITALS — HR 93 | Ht 65.0 in | Wt 302.8 lb

## 2016-11-14 DIAGNOSIS — I481 Persistent atrial fibrillation: Secondary | ICD-10-CM | POA: Diagnosis not present

## 2016-11-14 DIAGNOSIS — I10 Essential (primary) hypertension: Secondary | ICD-10-CM | POA: Diagnosis not present

## 2016-11-14 DIAGNOSIS — I4819 Other persistent atrial fibrillation: Secondary | ICD-10-CM

## 2016-11-14 NOTE — Progress Notes (Signed)
1.) Reason for visit: EKG check  2.) Name of MD requesting visit: Dr [name]  3.) H&P:  Persistent Atrial fib, HTN, HLD  4.) ROS related to problem: EKG performed. Patient states she feels she is back in A. Fib. Complaint of intermittent chest pain, SOB, palpitations and fatigue.   5.) Assessment and plan per MD: EKG shown to Dr [name] as well as discussed patient symptoms. EKG shows A fib. Dr [name] advised that patient will probably need to be seen by Dr [name] for EP and that he will be in touch with her concerning plan of care.  Patient verbalized understanding of plan and remembered Dr [name] discussing this with her at last visit. Patient will call 911 or go to ER if she develops new or worsening symptoms.

## 2016-11-15 ENCOUNTER — Telehealth: Payer: Self-pay | Admitting: Cardiovascular Disease

## 2016-11-15 NOTE — Telephone Encounter (Signed)
Spoke w/ pharmacy. ok'd them to fill pt's rx w/ digoxin they have.

## 2016-11-15 NOTE — Telephone Encounter (Signed)
Patient pharmacy needs permission to switch digoxin ndc to a different manufacturer

## 2016-11-15 NOTE — Telephone Encounter (Signed)
Please review note below.

## 2016-11-17 NOTE — Progress Notes (Signed)
We should schedule follow-up visit to discuss options She could restart amiodarone 400 mg twice a day for 5 days then down to 200 mg twice a day This might convert back to normal

## 2016-11-18 NOTE — Progress Notes (Signed)
Spoke w/ pt.  Advised her of Dr. [name] recommendation.  She is sched to see Dr. [name] on 5/24 and will discuss options w/ him at that time.

## 2016-11-27 ENCOUNTER — Encounter: Payer: Self-pay | Admitting: Emergency Medicine

## 2016-11-27 ENCOUNTER — Inpatient Hospital Stay
Admission: EM | Admit: 2016-11-27 | Discharge: 2016-11-29 | DRG: 309 | Disposition: A | Discharge status: Home / Self Care | Payer: Medicare Other | Attending: Internal Medicine | Admitting: Internal Medicine

## 2016-11-27 ENCOUNTER — Emergency Department: Payer: Medicare Other

## 2016-11-27 DIAGNOSIS — Z7901 Long term (current) use of anticoagulants: Secondary | ICD-10-CM

## 2016-11-27 DIAGNOSIS — I481 Persistent atrial fibrillation: Secondary | ICD-10-CM | POA: Diagnosis not present

## 2016-11-27 DIAGNOSIS — Z90710 Acquired absence of both cervix and uterus: Secondary | ICD-10-CM | POA: Diagnosis not present

## 2016-11-27 DIAGNOSIS — I4891 Unspecified atrial fibrillation: Secondary | ICD-10-CM | POA: Diagnosis present

## 2016-11-27 DIAGNOSIS — Z888 Allergy status to other drugs, medicaments and biological substances status: Secondary | ICD-10-CM

## 2016-11-27 DIAGNOSIS — Z9109 Other allergy status, other than to drugs and biological substances: Secondary | ICD-10-CM | POA: Diagnosis not present

## 2016-11-27 DIAGNOSIS — E890 Postprocedural hypothyroidism: Secondary | ICD-10-CM | POA: Diagnosis present

## 2016-11-27 DIAGNOSIS — F329 Major depressive disorder, single episode, unspecified: Secondary | ICD-10-CM | POA: Diagnosis present

## 2016-11-27 DIAGNOSIS — M25512 Pain in left shoulder: Secondary | ICD-10-CM

## 2016-11-27 DIAGNOSIS — Z87891 Personal history of nicotine dependence: Secondary | ICD-10-CM

## 2016-11-27 DIAGNOSIS — R079 Chest pain, unspecified: Secondary | ICD-10-CM | POA: Diagnosis present

## 2016-11-27 DIAGNOSIS — E669 Obesity, unspecified: Secondary | ICD-10-CM | POA: Diagnosis present

## 2016-11-27 DIAGNOSIS — Z6841 Body Mass Index (BMI) 40.0 and over, adult: Secondary | ICD-10-CM | POA: Diagnosis not present

## 2016-11-27 DIAGNOSIS — Z885 Allergy status to narcotic agent status: Secondary | ICD-10-CM

## 2016-11-27 DIAGNOSIS — I480 Paroxysmal atrial fibrillation: Secondary | ICD-10-CM | POA: Diagnosis not present

## 2016-11-27 DIAGNOSIS — R0789 Other chest pain: Secondary | ICD-10-CM | POA: Diagnosis present

## 2016-11-27 DIAGNOSIS — I119 Hypertensive heart disease without heart failure: Secondary | ICD-10-CM | POA: Diagnosis present

## 2016-11-27 DIAGNOSIS — Z79899 Other long term (current) drug therapy: Secondary | ICD-10-CM

## 2016-11-27 DIAGNOSIS — Z8249 Family history of ischemic heart disease and other diseases of the circulatory system: Secondary | ICD-10-CM

## 2016-11-27 DIAGNOSIS — K219 Gastro-esophageal reflux disease without esophagitis: Secondary | ICD-10-CM | POA: Diagnosis not present

## 2016-11-27 DIAGNOSIS — I10 Essential (primary) hypertension: Secondary | ICD-10-CM | POA: Diagnosis present

## 2016-11-27 DIAGNOSIS — Z91040 Latex allergy status: Secondary | ICD-10-CM

## 2016-11-27 DIAGNOSIS — F419 Anxiety disorder, unspecified: Secondary | ICD-10-CM | POA: Diagnosis present

## 2016-11-27 DIAGNOSIS — E785 Hyperlipidemia, unspecified: Secondary | ICD-10-CM | POA: Diagnosis not present

## 2016-11-27 DIAGNOSIS — G4730 Sleep apnea, unspecified: Secondary | ICD-10-CM | POA: Diagnosis present

## 2016-11-27 DIAGNOSIS — F32A Depression, unspecified: Secondary | ICD-10-CM | POA: Diagnosis present

## 2016-11-27 DIAGNOSIS — I4819 Other persistent atrial fibrillation: Secondary | ICD-10-CM

## 2016-11-27 LAB — CBC WITH DIFFERENTIAL/PLATELET
Basophils Absolute: 0.1 10*3/uL (ref 0–0.1)
Basophils Relative: 1 %
Eosinophils Absolute: 0.1 10*3/uL (ref 0–0.7)
Eosinophils Relative: 1 %
HCT: 40.5 % (ref 35.0–47.0)
Hemoglobin: 13.7 g/dL (ref 12.0–16.0)
Lymphocytes Relative: 28 %
Lymphs Abs: 2.7 10*3/uL (ref 1.0–3.6)
MCH: 29.3 pg (ref 26.0–34.0)
MCHC: 33.9 g/dL (ref 32.0–36.0)
MCV: 86.3 fL (ref 80.0–100.0)
Monocytes Absolute: 0.6 10*3/uL (ref 0.2–0.9)
Monocytes Relative: 6 %
Neutro Abs: 6.2 10*3/uL (ref 1.4–6.5)
Neutrophils Relative %: 64 %
Platelets: 212 10*3/uL (ref 150–440)
RBC: 4.69 MIL/uL (ref 3.80–5.20)
RDW: 15.2 % — ABNORMAL HIGH (ref 11.5–14.5)
WBC: 9.6 10*3/uL (ref 3.6–11.0)

## 2016-11-27 LAB — COMPREHENSIVE METABOLIC PANEL
ALT: 25 U/L (ref 14–54)
AST: 27 U/L (ref 15–41)
Albumin: 3.7 g/dL (ref 3.5–5.0)
Alkaline Phosphatase: 75 U/L (ref 38–126)
Anion gap: 8 (ref 5–15)
BUN: 18 mg/dL (ref 6–20)
CO2: 26 mmol/L (ref 22–32)
Calcium: 8.7 mg/dL — ABNORMAL LOW (ref 8.9–10.3)
Chloride: 106 mmol/L (ref 101–111)
Creatinine, Ser: 0.82 mg/dL (ref 0.44–1.00)
GFR calc Af Amer: >60 mL/min (ref >60)
GFR calc non Af Amer: >60 mL/min (ref >60)
Glucose, Bld: 157 mg/dL — ABNORMAL HIGH (ref 65–99)
Potassium: 3.6 mmol/L (ref 3.5–5.1)
Sodium: 140 mmol/L (ref 135–145)
Total Bilirubin: 0.4 mg/dL (ref 0.3–1.2)
Total Protein: 6.9 g/dL (ref 6.5–8.1)

## 2016-11-27 LAB — TROPONIN I: Troponin I: <0.03 ng/mL (ref <0.03)

## 2016-11-27 LAB — LIPASE, BLOOD: Lipase: 24 U/L (ref 11–51)

## 2016-11-27 MED ORDER — ASPIRIN 81 MG PO CHEW
324.0000 mg | CHEWABLE_TABLET | Freq: Once | ORAL | Status: AC
  Administered 2016-11-28: 324 mg via ORAL
  Filled 2016-11-27: qty 4

## 2016-11-27 MED ORDER — FUROSEMIDE 10 MG/ML IJ SOLN
20.0000 mg | Freq: Once | INTRAMUSCULAR | Status: AC
  Administered 2016-11-28: 20 mg via INTRAVENOUS
  Filled 2016-11-27: qty 4

## 2016-11-27 MED ORDER — NITROGLYCERIN 2 % TD OINT
0.5000 [in_us] | TOPICAL_OINTMENT | Freq: Once | TRANSDERMAL | Status: AC
  Administered 2016-11-28: 0.5 [in_us] via TOPICAL
  Filled 2016-11-27: qty 1

## 2016-11-27 NOTE — ED Provider Notes (Signed)
[name] Emergency Department Provider Note   ____________________________________________   None    (approximate)  I have reviewed the triage vital signs and the nursing notes.   HISTORY  Chief Complaint Chest Pain    HPI Amber Barnes is a 66 y.o. female who presents to the ED from home with a chief complaint of chest and arm pain. Patient states she has had intermittent left arm pain for the past month. Denies fall/injury/trauma. Reports left aching type pain exacerbated by lying down. Tonight she experienced tingling in her fingertips. She presents to the ED because she had left sided chest pressure type sensation radiating down her left arm which felt different from her arm pain for the past month. She was also sweaty and had some shortness of breath. States she is on Xarelto for atrial fibrillation; has been cardioverted 2 times but converted back to atrial fibrillation. She has an appointment with the EP cardiologist this coming week. Denies recent fever, chills, abdominal pain, nausea, vomiting, diarrhea. Denies recent travel or trauma. Denies hormone use.   Past Medical History:  Diagnosis Date  . Achilles tendinitis   . Acute medial meniscal tear   . Allergy   . Anxiety   . Arthritis   . Atrial fibrillation ([place])    a. new onset 07/2016; b. CHADS2VASc --> 2 (HTN, female); c. eliquis  . Chronic lower back pain   . COPD (chronic obstructive pulmonary disease) ([place])   . Depression   . Fibrocystic breast disease   . GERD (gastroesophageal reflux disease)   . Hyperlipidemia   . Hypertension   . Insomnia   . Lipoma   . Osteoarthritis    left knee  . Osteopenia   . Tendonitis   . Thyroid disease   . Vitamin D deficiency     Patient Active Problem List   Diagnosis Date Noted  . Persistent atrial fibrillation ([place]) 08/11/2016  . Acute pulmonary edema (HCC)   . New onset a-fib ([place]) 07/28/2016  . Hypokalemia 07/28/2016  . Left foot  pain 07/28/2016  . Chest pain   . Atrial fibrillation with RVR ([place]) 07/27/2016  . Fatty liver 06/30/2016  . Dizziness 06/08/2016  . Family history of ovarian cancer 06/08/2016  . GERD (gastroesophageal reflux disease) 05/05/2016  . Seasonal rhinitis 05/05/2016  . Routine physical examination 05/05/2016  . Heel spur 11/05/2015  . Tendonitis 11/05/2015  . SOB (shortness of breath) 11/11/2014  . Morbid obesity ([place]) 11/11/2014  . Mixed hyperlipidemia 11/11/2014  . Essential hypertension 11/11/2014  . Anxiety and depression 11/11/2014  . Arthritis, senescent 11/11/2014  . Weight loss due to medication 11/11/2014  . Obesity, Class III, BMI 40-49.9 (morbid obesity) ([place]) 04/11/2013    Past Surgical History:  Procedure Laterality Date  . ABDOMINAL HYSTERECTOMY     ovaries left  . ACHILLES TENDON SURGERY    . BACK SURGERY    . CARDIOVERSION N/A 09/07/2016   Procedure: CARDIOVERSION;  Surgeon: Minna Merritts, MD;  Location: [facility] ORS;  Service: Cardiovascular;  Laterality: N/A;  . CARDIOVERSION N/A 10/19/2016   Procedure: CARDIOVERSION;  Surgeon: Minna Merritts, MD;  Location: [facility] ORS;  Service: Cardiovascular;  Laterality: N/A;  . COLONOSCOPY    . ESOPHAGOGASTRODUODENOSCOPY    . THYROIDECTOMY     thyroid lobectomy secondary to a benign nodule    Prior to Admission medications   Medication Sig Start Date End Date Taking? Authorizing Provider  acetaminophen-codeine (TYLENOL #3) 300-30 MG tablet Take 1-2  tablets by mouth every 6 (six) hours as needed for moderate pain. 09/22/16   Trula Slade, DPM  amiodarone (PACERONE) 200 MG tablet Take 1 tablet (200 mg total) by mouth 2 (two) times daily. Patient taking differently: Take 200 mg by mouth daily.  09/20/16   Minna Merritts, MD  apixaban (ELIQUIS) 5 MG TABS tablet Take 1 tablet (5 mg total) by mouth 2 (two) times daily. 09/27/16   Minna Merritts, MD  B Complex Vitamins (VITAMIN B COMPLEX PO) Take 1 tablet by mouth daily.      [provider]  bisoprolol (ZEBETA) 10 MG tablet Take 1 tablet (10 mg total) by mouth 2 (two) times daily. 09/29/16   Minna Merritts, MD  busPIRone (BUSPAR) 10 MG tablet Take 1 tablet (10 mg total) by mouth 2 (two) times daily. 10/31/16   [name], FNP  CALCIUM PO Take 500 mg by mouth daily.     [provider]  Cholecalciferol (VITAMIN D PO) Take 5,000 mg by mouth daily.     [provider]  digoxin (LANOXIN) 0.25 MG tablet Take 1 tablet (0.25 mg total) by mouth daily. Patient not taking: Reported on 11/14/2016 10/18/16   Minna Merritts, MD  diltiazem (CARDIZEM CD) 240 MG 24 hr capsule Take 1 capsule (240 mg total) by mouth daily. 09/12/16   Minna Merritts, MD  diltiazem (CARDIZEM) 30 MG tablet Take 1 tablet (30 mg total) by mouth as needed. Cardizem 30 mg by mouth as needed for palpitations and tachycardia with heart rate greater than 100 08/11/16   Gouru, Aruna, MD  DULoxetine (CYMBALTA) 60 MG capsule Take 2 capsules (120 mg total) by mouth daily. 09/30/16   [name], FNP  furosemide (LASIX) 20 MG tablet Take 1 tablet (20 mg total) by mouth 2 (two) times daily. 09/12/16   Minna Merritts, MD  lovastatin (MEVACOR) 20 MG tablet Take 20 mg by mouth every morning.    [provider]  Multiple Vitamin (MULTIVITAMIN) tablet Take 1 tablet by mouth daily.    [provider]  NONFORMULARY OR COMPOUNDED ITEM Apply 1 application topically daily as needed. Anti-inflammatory Pain Cream - Diclofenac 3%, Baclofen 2%, Cyclobenzaprine 2%, Lidocaine 2%, apply 1-2 grams to affected area daily as needed    [provider]  oxymetazoline (AFRIN) 0.05 % nasal spray Place 1 spray into both nostrils 2 (two) times daily as needed for congestion.    [provider]  potassium chloride (KLOR-CON 10) 10 MEQ tablet Take 1 tablet (10 mEq total) by mouth 2 (two) times daily. 09/22/16   Minna Merritts, MD  rosuvastatin (CRESTOR) 20 MG tablet  Take 1 tablet (20 mg total) by mouth daily. 11/07/16   [name], FNP  sennosides-docusate sodium (SENOKOT-S) 8.6-50 MG tablet Take 1 tablet by mouth daily as needed for constipation.    [provider]    Allergies Prednisone; Hydrocodone-acetaminophen; Adhesive [tape]; and Latex  Family History  Problem Relation Age of Onset  . Cancer Sister 65       ovarian  . Cancer Father 74       colon  . Heart disease Father   . Heart attack Father 41       died from MI  . Hyperlipidemia Father   . Hypertension Father   . Cancer Mother        leukemia  . Breast cancer Paternal Aunt        67's  Social History Social History  Substance Use Topics  . Smoking status: Former Smoker    Packs/day: 0.25    Years: 10.00    Types: Cigarettes  . Smokeless tobacco: Former Systems developer    Quit date: 07/11/2001     Comment: smoked for 10 years  . Alcohol use Yes     Comment: social, occasional    Review of Systems  Constitutional: No fever/chills. Eyes: No visual changes. ENT: No sore throat. Cardiovascular: Positive for chest pain. Respiratory: Positive for shortness of breath. Gastrointestinal: No abdominal pain.  No nausea, no vomiting.  No diarrhea.  No constipation. Genitourinary: Negative for dysuria. Musculoskeletal: Negative for back pain. Skin: Negative for rash. Neurological: Negative for headaches, focal weakness or numbness.   ____________________________________________   PHYSICAL EXAM:  VITAL SIGNS: ED Triage Vitals  Enc Vitals Group     BP 11/27/16 2155 128/72     Pulse Rate 11/27/16 2155 80     Resp 11/27/16 2155 18     Temp 11/27/16 2155 98.8 F (37.1 C)     Temp Source 11/27/16 2155 Oral     SpO2 11/27/16 2155 94 %     Weight 11/27/16 2156 (!) 302 lb (137 kg)     Height 11/27/16 2156 5\' 5"  (1.651 m)     Head Circumference --      Peak Flow --      Pain Score 11/27/16 2155 9     Pain Loc --      Pain Edu? --      Excl. in [place]? --      Constitutional: Alert and oriented. Well appearing and in no acute distress. Eyes: Conjunctivae are normal. PERRL. EOMI. Head: Atraumatic. Nose: No congestion/rhinnorhea. Mouth/Throat: Mucous membranes are moist.  Oropharynx non-erythematous. Neck: No stridor.   Cardiovascular: Normal rate, regular rhythm. Grossly normal heart sounds.  Good peripheral circulation. Respiratory: Normal respiratory effort.  No retractions. Lungs with faint bibasilar rails. Gastrointestinal: Soft and nontender. No distention. No abdominal bruits. No CVA tenderness. Musculoskeletal: No lower extremity tenderness nor edema.  No joint effusions. Neurologic:  Normal speech and language. No gross focal neurologic deficits are appreciated. No gait instability. Skin:  Skin is warm, dry and intact. No rash noted. Psychiatric: Mood and affect are normal. Speech and behavior are normal.  ____________________________________________   LABS (all labs ordered are listed, but only abnormal results are displayed)  Labs Reviewed  CBC WITH DIFFERENTIAL/PLATELET - Abnormal; Notable for the following:       Result Value   RDW 15.2 (*)    All other components within normal limits  COMPREHENSIVE METABOLIC PANEL - Abnormal; Notable for the following:    Glucose, Bld 157 (*)    Calcium 8.7 (*)    All other components within normal limits  TROPONIN I  LIPASE, BLOOD   ____________________________________________  EKG  ED ECG REPORT I, SUNG,JADE J, the attending physician, personally viewed and interpreted this ECG.   Date: 11/27/2016  EKG Time: 2152  Rate: 81  Rhythm: atrial fibrillation, rate 81  Axis: LAD  Intervals:none  ST&T Change: Nonspecific  ____________________________________________  RADIOLOGY  Chest x-ray interpreted per Dr. Collins [place]: Mild cardiomegaly with unchanged interstitial prominence, but no  overt pulmonary edema.    ____________________________________________   PROCEDURES  Procedure(s) performed: None  Procedures  Critical Care performed: No  ____________________________________________   INITIAL IMPRESSION / ASSESSMENT AND PLAN / ED COURSE  Pertinent labs & imaging results that were available during my care of the  patient were reviewed by me and considered in my medical decision making (see chart for details).  66 year old female with persistent atrial fibrillation, on Xarelto, history of pulmonary edema, who presents with left-sided chest pain. Rales heard on exam. EKG demonstrates rate controlled atrial fibrillation; initial troponin is negative. Will administer aspirin, nitroglycerin, Lasix and discuss with hospitalist to evaluate patient in the emergency department for admission.      ____________________________________________   FINAL CLINICAL IMPRESSION(S) / ED DIAGNOSES  Final diagnoses:  Chest pain, unspecified type  Persistent atrial fibrillation (HCC)      NEW MEDICATIONS STARTED DURING THIS VISIT:  New Prescriptions   No medications on file     Note:  This document was prepared using Dragon voice recognition software and may include unintentional dictation errors.    Paulette Blanch, MD 11/28/16 [phone]

## 2016-11-27 NOTE — ED Triage Notes (Signed)
Patient with complaint of intermittent left side chest pain radiating down her arm. Patient states that she has some shortness of breath. Patient states that she was diagnosed with afib and has been cardioverted times two but converts back into afib.

## 2016-11-28 DIAGNOSIS — Z8249 Family history of ischemic heart disease and other diseases of the circulatory system: Secondary | ICD-10-CM | POA: Diagnosis not present

## 2016-11-28 DIAGNOSIS — Z888 Allergy status to other drugs, medicaments and biological substances status: Secondary | ICD-10-CM | POA: Diagnosis not present

## 2016-11-28 DIAGNOSIS — F419 Anxiety disorder, unspecified: Secondary | ICD-10-CM | POA: Diagnosis not present

## 2016-11-28 DIAGNOSIS — R0789 Other chest pain: Secondary | ICD-10-CM

## 2016-11-28 DIAGNOSIS — I480 Paroxysmal atrial fibrillation: Secondary | ICD-10-CM | POA: Diagnosis not present

## 2016-11-28 DIAGNOSIS — Z6841 Body Mass Index (BMI) 40.0 and over, adult: Secondary | ICD-10-CM | POA: Diagnosis not present

## 2016-11-28 DIAGNOSIS — I481 Persistent atrial fibrillation: Principal | ICD-10-CM

## 2016-11-28 DIAGNOSIS — Z885 Allergy status to narcotic agent status: Secondary | ICD-10-CM | POA: Diagnosis not present

## 2016-11-28 DIAGNOSIS — E785 Hyperlipidemia, unspecified: Secondary | ICD-10-CM | POA: Diagnosis not present

## 2016-11-28 DIAGNOSIS — F329 Major depressive disorder, single episode, unspecified: Secondary | ICD-10-CM | POA: Diagnosis not present

## 2016-11-28 DIAGNOSIS — I119 Hypertensive heart disease without heart failure: Secondary | ICD-10-CM | POA: Diagnosis not present

## 2016-11-28 DIAGNOSIS — I4891 Unspecified atrial fibrillation: Secondary | ICD-10-CM | POA: Diagnosis present

## 2016-11-28 DIAGNOSIS — G4730 Sleep apnea, unspecified: Secondary | ICD-10-CM | POA: Diagnosis not present

## 2016-11-28 DIAGNOSIS — R079 Chest pain, unspecified: Secondary | ICD-10-CM | POA: Diagnosis present

## 2016-11-28 DIAGNOSIS — Z91040 Latex allergy status: Secondary | ICD-10-CM | POA: Diagnosis not present

## 2016-11-28 DIAGNOSIS — Z90710 Acquired absence of both cervix and uterus: Secondary | ICD-10-CM | POA: Diagnosis not present

## 2016-11-28 DIAGNOSIS — Z9109 Other allergy status, other than to drugs and biological substances: Secondary | ICD-10-CM | POA: Diagnosis not present

## 2016-11-28 DIAGNOSIS — K219 Gastro-esophageal reflux disease without esophagitis: Secondary | ICD-10-CM | POA: Diagnosis not present

## 2016-11-28 DIAGNOSIS — E890 Postprocedural hypothyroidism: Secondary | ICD-10-CM | POA: Diagnosis not present

## 2016-11-28 DIAGNOSIS — Z87891 Personal history of nicotine dependence: Secondary | ICD-10-CM | POA: Diagnosis not present

## 2016-11-28 DIAGNOSIS — M25512 Pain in left shoulder: Secondary | ICD-10-CM

## 2016-11-28 LAB — BASIC METABOLIC PANEL
Anion gap: 8 (ref 5–15)
BUN: 17 mg/dL (ref 6–20)
CO2: 27 mmol/L (ref 22–32)
Calcium: 8.5 mg/dL — ABNORMAL LOW (ref 8.9–10.3)
Chloride: 106 mmol/L (ref 101–111)
Creatinine, Ser: 0.8 mg/dL (ref 0.44–1.00)
GFR calc Af Amer: >60 mL/min (ref >60)
GFR calc non Af Amer: >60 mL/min (ref >60)
Glucose, Bld: 97 mg/dL (ref 65–99)
Potassium: 3.6 mmol/L (ref 3.5–5.1)
Sodium: 141 mmol/L (ref 135–145)

## 2016-11-28 LAB — MAGNESIUM: Magnesium: 1.9 mg/dL (ref 1.7–2.4)

## 2016-11-28 LAB — CBC
HCT: 39.6 % (ref 35.0–47.0)
Hemoglobin: 13.3 g/dL (ref 12.0–16.0)
MCH: 29.5 pg (ref 26.0–34.0)
MCHC: 33.6 g/dL (ref 32.0–36.0)
MCV: 87.6 fL (ref 80.0–100.0)
Platelets: 183 10*3/uL (ref 150–440)
RBC: 4.53 MIL/uL (ref 3.80–5.20)
RDW: 14.9 % — ABNORMAL HIGH (ref 11.5–14.5)
WBC: 8.6 10*3/uL (ref 3.6–11.0)

## 2016-11-28 LAB — TROPONIN I
Troponin I: <0.03 ng/mL (ref <0.03)
Troponin I: <0.03 ng/mL (ref <0.03)
Troponin I: <0.03 ng/mL (ref <0.03)

## 2016-11-28 MED ORDER — AMIODARONE HCL 200 MG PO TABS
200.0000 mg | ORAL_TABLET | Freq: Two times a day (BID) | ORAL | Status: DC
  Administered 2016-11-28 – 2016-11-29 (×3): 200 mg via ORAL
  Filled 2016-11-28 (×3): qty 1

## 2016-11-28 MED ORDER — FUROSEMIDE 20 MG PO TABS
20.0000 mg | ORAL_TABLET | Freq: Two times a day (BID) | ORAL | Status: DC
  Administered 2016-11-28 – 2016-11-29 (×4): 20 mg via ORAL
  Filled 2016-11-28 (×4): qty 1

## 2016-11-28 MED ORDER — ACETAMINOPHEN 325 MG PO TABS
650.0000 mg | ORAL_TABLET | Freq: Four times a day (QID) | ORAL | Status: DC | PRN
  Administered 2016-11-28 – 2016-11-29 (×2): 650 mg via ORAL
  Filled 2016-11-28 (×2): qty 2

## 2016-11-28 MED ORDER — SODIUM CHLORIDE 0.9% FLUSH
3.0000 mL | Freq: Two times a day (BID) | INTRAVENOUS | Status: DC
  Administered 2016-11-28 – 2016-11-29 (×2): 3 mL via INTRAVENOUS

## 2016-11-28 MED ORDER — ONDANSETRON HCL 4 MG PO TABS: 4.0000 mg | ORAL_TABLET | Freq: Four times a day (QID) | ORAL | Status: DC | PRN

## 2016-11-28 MED ORDER — AMIODARONE HCL 200 MG PO TABS
200.0000 mg | ORAL_TABLET | Freq: Two times a day (BID) | ORAL | Status: DC
  Administered 2016-11-28: 200 mg via ORAL
  Filled 2016-11-28: qty 1

## 2016-11-28 MED ORDER — BISOPROLOL FUMARATE 5 MG PO TABS
10.0000 mg | ORAL_TABLET | Freq: Two times a day (BID) | ORAL | Status: DC
  Administered 2016-11-28 – 2016-11-29 (×4): 10 mg via ORAL
  Filled 2016-11-28 (×4): qty 2

## 2016-11-28 MED ORDER — ROSUVASTATIN CALCIUM 20 MG PO TABS
20.0000 mg | ORAL_TABLET | Freq: Every day | ORAL | Status: DC
  Administered 2016-11-28 – 2016-11-29 (×2): 20 mg via ORAL
  Filled 2016-11-28 (×2): qty 1

## 2016-11-28 MED ORDER — ACETAMINOPHEN 650 MG RE SUPP: 650.0000 mg | Freq: Four times a day (QID) | RECTAL | Status: DC | PRN

## 2016-11-28 MED ORDER — ONDANSETRON HCL 4 MG/2ML IJ SOLN: 4.0000 mg | Freq: Four times a day (QID) | INTRAMUSCULAR | Status: DC | PRN

## 2016-11-28 MED ORDER — POTASSIUM CHLORIDE CRYS ER 20 MEQ PO TBCR
40.0000 meq | EXTENDED_RELEASE_TABLET | Freq: Once | ORAL | Status: AC
  Administered 2016-11-28: 40 meq via ORAL
  Filled 2016-11-28: qty 2

## 2016-11-28 MED ORDER — AMIODARONE HCL 200 MG PO TABS: 200.0000 mg | ORAL_TABLET | Freq: Every day | ORAL | Status: DC

## 2016-11-28 MED ORDER — SODIUM CHLORIDE 0.9 % IV SOLN
INTRAVENOUS | Status: DC
  Administered 2016-11-28: 02:00:00 via INTRAVENOUS

## 2016-11-28 MED ORDER — DULOXETINE HCL 30 MG PO CPEP
120.0000 mg | ORAL_CAPSULE | Freq: Every day | ORAL | Status: DC
  Administered 2016-11-28 – 2016-11-29 (×2): 120 mg via ORAL
  Filled 2016-11-28 (×2): qty 4

## 2016-11-28 MED ORDER — APIXABAN 5 MG PO TABS
5.0000 mg | ORAL_TABLET | Freq: Two times a day (BID) | ORAL | Status: DC
  Administered 2016-11-28 – 2016-11-29 (×4): 5 mg via ORAL
  Filled 2016-11-28 (×4): qty 1

## 2016-11-28 MED ORDER — BUSPIRONE HCL 5 MG PO TABS
10.0000 mg | ORAL_TABLET | Freq: Two times a day (BID) | ORAL | Status: DC
  Administered 2016-11-28 – 2016-11-29 (×4): 10 mg via ORAL
  Filled 2016-11-28 (×4): qty 2

## 2016-11-28 NOTE — Care Management Obs Status (Signed)
[facility] NOTIFICATION   Patient Details  Name: Amber Barnes MRN: [id number] Date of Birth: Aug 28, 1950   Medicare Observation Status Notification Given:  Yes    Katrina Stack, RN 11/28/2016, 12:13 PM

## 2016-11-28 NOTE — Progress Notes (Signed)
[facility] [place] [facility] [place] NAME: Amber Barnes    MR#:  [id number]  DATE OF BIRTH:  [birth year]  SUBJECTIVE:  CHIEF COMPLAINT:   Chief Complaint  Patient presents with  . Chest Pain   - admitted with symptomatic afib- chest tightness, weakness and diaphoresis. -Symptoms much improved now. Converted into A. Fib about 2 weeks ago.  REVIEW OF SYSTEMS:  Review of Systems  Constitutional: Positive for malaise/fatigue. Negative for chills and fever.  Respiratory: Positive for shortness of breath. Negative for cough and wheezing.   Cardiovascular: Positive for leg swelling. Negative for chest pain and palpitations.  Gastrointestinal: Negative for abdominal pain, constipation, diarrhea, nausea and vomiting.  Genitourinary: Negative for dysuria.  Musculoskeletal: Negative for myalgias.  Neurological: Negative for dizziness, speech change, focal weakness, seizures and headaches.  Psychiatric/Behavioral: Negative for depression.    DRUG ALLERGIES:   Allergies  Allergen Reactions  . Prednisone Other (See Comments)    Agitation, hyperactivity, polyphagia  . Hydrocodone-Acetaminophen Other (See Comments)    Hallucinations and combative  . Adhesive [Tape] Itching and Rash  . Latex Itching and Rash    use paper tape    VITALS:  Blood pressure 131/82, pulse 99, temperature 97.9 F (36.6 C), temperature source Oral, resp. rate 18, height 5\' 5"  (1.651 m), weight (!) 136.9 kg (301 lb 11.2 oz), SpO2 93 %.  PHYSICAL EXAMINATION:  Physical Exam  GENERAL:  66 y.o.-year-old obese patient lying in the bed with no acute distress.  EYES: Pupils equal, round, reactive to light and accommodation. No scleral icterus. Extraocular muscles intact.  HEENT: Head atraumatic, normocephalic. Oropharynx and nasopharynx clear.  NECK:  Supple, no jugular venous distention. No thyroid enlargement, no tenderness.  LUNGS: Normal breath sounds bilaterally, no wheezing,  rales,rhonchi or crepitation. No use of accessory muscles of respiration.  CARDIOVASCULAR: S1, S2 normal. No murmurs, rubs, or gallops.  ABDOMEN: Soft, nontender, nondistended. Bowel sounds present. No organomegaly or mass.  EXTREMITIES: No cyanosis, or clubbing. 1+ trace pedal edema noted NEUROLOGIC: Cranial nerves II through XII are intact. Muscle strength 5/5 in all extremities. Sensation intact. Gait not checked.  PSYCHIATRIC: The patient is alert and oriented x 3.  SKIN: No obvious rash, lesion, or ulcer.    LABORATORY PANEL:   CBC  Recent Labs Lab 11/28/16 0724  WBC 8.6  HGB 13.3  HCT 39.6  PLT 183   ------------------------------------------------------------------------------------------------------------------  Chemistries   Recent Labs Lab 11/27/16 2228 11/28/16 0724  NA 140 141  K 3.6 3.6  CL 106 106  CO2 26 27  GLUCOSE 157* 97  BUN 18 17  CREATININE 0.82 0.80  CALCIUM 8.7* 8.5*  MG  --  1.9  AST 27  --   ALT 25  --   ALKPHOS 75  --   BILITOT 0.4  --    ------------------------------------------------------------------------------------------------------------------  Cardiac Enzymes  Recent Labs Lab 11/28/16 0724  TROPONINI <0.03   ------------------------------------------------------------------------------------------------------------------  RADIOLOGY:  Dg Chest 2 View  Result Date: 11/27/2016 CLINICAL DATA:  Left-sided chest pain EXAM: CHEST  2 VIEW COMPARISON:  Chest radiograph 08/10/2016 FINDINGS: Unchanged cardiomegaly. Mild interstitial prominence is unchanged. No pleural effusion or pneumothorax. No focal consolidation or of overt pulmonary edema. IMPRESSION: Mild cardiomegaly with unchanged interstitial prominence, but no overt pulmonary edema. Electronically Signed   By: Ulyses Jarred M.D.   On: 11/27/2016 22:54    EKG:   Orders placed or performed during the hospital encounter of 11/27/16  .  EKG 12-Lead  . EKG 12-Lead     ASSESSMENT AND PLAN:   55-year-old female with past medical history significant for paroxysmal atrial fibrillation status post cardioversion twice in the last 2 months, obesity,hypertension, hyperlipidemia, hypothyroidism presents to the hospital secondary to chest tightness.  #1 Symptomatic atrial fibrillation-patient was diagnosed with A. Fib in January 2018 and she has always had symptoms associated with that. She had cardioversion once in February and second one in April 2018. She remains on eliquis. -However her symptoms recurred again on 11/14/2016 and EKG showed that she converted into A. Fib again. -cardiology consulted. Will need sleep study as outpatient - continue amiodarone - Dr. [name] to see patient tomorrow and may possible repeat cardioversion again tomorrow  #2 Chest pain- likely from afib as it relieves when she is cardioverted and converted to sinus rhythm Last myoview in Jan 2018- with no reversible ischemic changes The arm pain- sounds musculoskeletal  #3 hypertension-continue bisoprolol. -also on Lasix.  #4 depression and anxiety-on Cymbalta and BuSpar  #5 DVT prophylaxis-on eliquis   All the records are reviewed and case discussed with Care Management/Social Workerr. Management plans discussed with the patient, family and they are in agreement.  CODE STATUS: full code  TOTAL TIME TAKING CARE OF THIS PATIENT: 38 minutes.   POSSIBLE D/C IN 2 DAYS, DEPENDING ON CLINICAL CONDITION.   Gladstone Lighter M.D on 11/28/2016 at 10:20 AM  Between 7am to 6pm - Pager - [phone]  After 6pm go to [email or web address] - password [email or web address] [facility]  Office  [phone]  CC: Primary care physician; [name], FNP

## 2016-11-28 NOTE — Consult Note (Signed)
Cardiology Consultation Note  Patient ID: Amber Barnes, MRN: [id number], DOB/AGE: August 27, 1950 66 y.o. Admit date: 11/27/2016   Date of Consult: 11/28/2016 Primary Physician: [name], FNP Primary Cardiologist: Dr. [name], MD Requesting Physician: Dr. Jannifer Franklin, MD  Chief Complaint: Arm/chest pain/palpitations/SOB Reason for Consult: Same  HPI: Amber Barnes is a 66 y.o. female who is being seen today for the evaluation of persistent Afib/arm pain/chest pain, MD at the request of Dr. Jannifer Franklin, MD. Patient has a h/o persistent Afib on Eliquis s/p briefly successful DCCV 09/07/2016 on Flecainide s/p briefly successful DCCV on 10/19/2016 on amiodarone, COPD, morbid obesity, bone spur along the foot with planned elective surgery pending, and anxiety/depression who presented to [facility] on 5/20 with a 1 day history of left arm pain that radiated to her left upper chest and was worse with movement.   Patient was initially diagnosed with Afib in 07/2016 and started on Flecainide and Eliquis at that time. She was very symptomatic with her Afib noting increased SOB, chest heaviness, palpitations, and fatigue. During that admission she underwent TTE that showed LVSF of 55-60%, normal wall motion, study not technically sufficient to allow for LV diastolic function. After adequate anticoagulation she underwent DCCV on 2/28 that last for a brief amount of time. Follow up EKG in the office showed she was back in Afib. Her Flecainide was held and after a washout she was placed on amiodarone. She underwent repeat DCCV on 4/11 on amiodarone and again held for a brief amount of time, though went back into Afib, as documented by office 12-lead on 5/7. At that time she was advised to increase amiodarone to 400 mg bid x 7 days then go back down to 200 mg bid and has been taking 200 mg bid since. Again, she was symptomatic with the above each time she went back into Afib. She has not missed any doses of her Eliquis. She  already has an appointment scheduled with Dr. [name], MD for 5/24 at 8:30 AM.   On 5/20 she noted left humeral pain with positional movements that radiated to her posterior, lateral neck. Pain occasionally radiated to her left upper, outer chest wall. Pain was not exertional and felt like her prior episodes when she has been in Afib. There was some associated nausea. Otherwise, no associated symptoms.   Upon the patient's arrival to [facility] they were found to have BP 128/72, HR 80 bpm, temp 98.8, oxygen saturation 94% on room air, weight 302 pounds. EKG showed Afib as below, CXR showed mild cardiomegaly with unchanged interstitial findings. Labs showed troponin negative x 3, SCr 0.82, K+ 3.6-->3.6, lipase 24, unremarkable cbc x 2. Recent normal TSH in 07/2016 admission. She has remained rate controlled in the 60s to 80s bpm, in Afib.   Past Medical History:  Diagnosis Date  . Achilles tendinitis   . Acute medial meniscal tear   . Allergy   . Anxiety   . Arthritis   . Atrial fibrillation ([place])    a. new onset 07/2016; b. CHADS2VASc --> 2 (HTN, female); c. eliquis  . Chronic lower back pain   . COPD (chronic obstructive pulmonary disease) ([place])   . Depression   . Fibrocystic breast disease   . GERD (gastroesophageal reflux disease)   . Hyperlipidemia   . Hypertension   . Insomnia   . Lipoma   . Osteoarthritis    left knee  . Osteopenia   . Tendonitis   . Thyroid disease   . Vitamin D  deficiency       Most Recent Cardiac Studies: Myoview 07/2016: Study Result    Defect 1: There is a small defect of mild severity present in the mid anteroseptal, apical septal and apex location. Possible small anteroseptal/apical infarct with no ischemia.  This is a low risk study.  The left ventricular ejection fraction is normal (55-65%).  This study is suboptimal due to obesity and breast attenuation.   TTE 07/2016: Study Conclusions  - Left ventricle: The cavity size was normal. There was  mild   concentric hypertrophy. Systolic function was normal. The   estimated ejection fraction was in the range of 55% to 60%. Wall   motion was normal; there were no regional wall motion   abnormalities. The study is not technically sufficient to allow   evaluation of LV diastolic function. - Mitral valve: There was mild regurgitation. - Left atrium: The atrium was mildly dilated. - Right ventricle: Systolic function was normal. - Pulmonary arteries: Systolic pressure could not be accurately   estimated.  Impressions:  - Rhythm is atrial fibrillation with RVR.   Surgical History:  Past Surgical History:  Procedure Laterality Date  . ABDOMINAL HYSTERECTOMY     ovaries left  . ACHILLES TENDON SURGERY    . BACK SURGERY    . CARDIOVERSION N/A 09/07/2016   Procedure: CARDIOVERSION;  Surgeon: Minna Merritts, MD;  Location: [facility] ORS;  Service: Cardiovascular;  Laterality: N/A;  . CARDIOVERSION N/A 10/19/2016   Procedure: CARDIOVERSION;  Surgeon: Minna Merritts, MD;  Location: [facility] ORS;  Service: Cardiovascular;  Laterality: N/A;  . COLONOSCOPY    . ESOPHAGOGASTRODUODENOSCOPY    . THYROIDECTOMY     thyroid lobectomy secondary to a benign nodule     Home Meds: Prior to Admission medications   Medication Sig Start Date End Date Taking? Authorizing Provider  amiodarone (PACERONE) 200 MG tablet Take 1 tablet (200 mg total) by mouth 2 (two) times daily. 09/20/16  Yes Gollan, [name], MD  apixaban (ELIQUIS) 5 MG TABS tablet Take 1 tablet (5 mg total) by mouth 2 (two) times daily. 09/27/16  Yes Minna Merritts, MD  B Complex Vitamins (VITAMIN B COMPLEX PO) Take 1 tablet by mouth daily.    Yes [provider]  bisoprolol (ZEBETA) 10 MG tablet Take 1 tablet (10 mg total) by mouth 2 (two) times daily. 09/29/16  Yes Gollan, [name], MD  busPIRone (BUSPAR) 10 MG tablet Take 1 tablet (10 mg total) by mouth 2 (two) times daily. 10/31/16  Yes Arnett, Yvetta Coder, FNP  calcium  carbonate (TUMS - DOSED IN MG ELEMENTAL CALCIUM) 500 MG chewable tablet Chew 1 tablet by mouth daily.   Yes [provider]  Cholecalciferol (VITAMIN D PO) Take 5,000 mg by mouth daily.    Yes [provider]  DULoxetine (CYMBALTA) 60 MG capsule Take 2 capsules (120 mg total) by mouth daily. 09/30/16  Yes Arnett, Yvetta Coder, FNP  furosemide (LASIX) 20 MG tablet Take 1 tablet (20 mg total) by mouth 2 (two) times daily. 09/12/16  Yes Minna Merritts, MD  Multiple Vitamin (MULTIVITAMIN) tablet Take 1 tablet by mouth daily.   Yes [provider]  oxymetazoline (AFRIN) 0.05 % nasal spray Place 1 spray into both nostrils 2 (two) times daily as needed for congestion.   Yes [provider]  potassium chloride (KLOR-CON 10) 10 MEQ tablet Take 1 tablet (10 mEq total) by mouth 2 (two) times daily. 09/22/16  Yes Ida Rogue  J, MD  rosuvastatin (CRESTOR) 20 MG tablet Take 1 tablet (20 mg total) by mouth daily. 11/07/16  Yes Arnett, Yvetta Coder, FNP  sennosides-docusate sodium (SENOKOT-S) 8.6-50 MG tablet Take 1 tablet by mouth at bedtime as needed for constipation.    Yes [provider]    Inpatient Medications:  . amiodarone  200 mg Oral BID  . apixaban  5 mg Oral BID  . bisoprolol  10 mg Oral BID  . busPIRone  10 mg Oral BID  . DULoxetine  120 mg Oral Daily  . furosemide  20 mg Oral BID  . rosuvastatin  20 mg Oral Daily   . sodium chloride 75 mL/hr at 11/28/16 0205    Allergies:  Allergies  Allergen Reactions  . Prednisone Other (See Comments)    Agitation, hyperactivity, polyphagia  . Hydrocodone-Acetaminophen Other (See Comments)    Hallucinations and combative  . Adhesive [Tape] Itching and Rash  . Latex Itching and Rash    use paper tape    Social History   Social History  . Marital status: Married    Spouse name: N/A  . Number of children: N/A  . Years of education: N/A   Occupational History  . Not on file.   Social History Main  Topics  . Smoking status: Former Smoker    Packs/day: 0.25    Years: 10.00    Types: Cigarettes  . Smokeless tobacco: Former Systems developer    Quit date: 07/11/2001     Comment: smoked for 10 years  . Alcohol use Yes     Comment: social, occasional  . Drug use: No  . Sexual activity: Not on file   Other Topics Concern  . Not on file   Social History Narrative   Moved to [place] from [place].       Grandmother.          Family History  Problem Relation Age of Onset  . Cancer Sister 70       ovarian  . Cancer Father 75       colon  . Heart disease Father   . Heart attack Father 56       died from MI  . Hyperlipidemia Father   . Hypertension Father   . Cancer Mother        leukemia  . Breast cancer Paternal Aunt        46's     Review of Systems: Review of Systems  Constitutional: Positive for malaise/fatigue. Negative for chills, diaphoresis, fever and weight loss.  HENT: Negative for congestion.   Eyes: Negative for discharge and redness.  Respiratory: Positive for shortness of breath. Negative for cough, hemoptysis, sputum production and wheezing.   Cardiovascular: Positive for chest pain and palpitations. Negative for orthopnea, claudication, leg swelling and PND.  Gastrointestinal: Negative for abdominal pain, blood in stool, heartburn, melena, nausea and vomiting.  Genitourinary: Negative for hematuria.  Musculoskeletal: Positive for joint pain, myalgias and neck pain. Negative for falls.  Skin: Negative for rash.  Neurological: Positive for weakness. Negative for dizziness, tingling, tremors, sensory change, speech change, focal weakness and loss of consciousness.  Endo/Heme/Allergies: Does not bruise/bleed easily.  Psychiatric/Behavioral: Negative for substance abuse. The patient is not nervous/anxious.   All other systems reviewed and are negative.   Labs:  Recent Labs  11/27/16 2228 11/28/16 0124 11/28/16 0724  TROPONINI <0.03 <0.03 <0.03   Lab  Results  Component Value Date   WBC 8.6 11/28/2016   HGB 13.3  11/28/2016   HCT 39.6 11/28/2016   MCV 87.6 11/28/2016   PLT 183 11/28/2016     Recent Labs Lab 11/27/16 2228 11/28/16 0724  NA 140 141  K 3.6 3.6  CL 106 106  CO2 26 27  BUN 18 17  CREATININE 0.82 0.80  CALCIUM 8.7* 8.5*  PROT 6.9  --   BILITOT 0.4  --   ALKPHOS 75  --   ALT 25  --   AST 27  --   GLUCOSE 157* 97   Lab Results  Component Value Date   CHOL 126 08/11/2016   HDL 45 08/11/2016   LDLCALC 65 08/11/2016   TRIG 82 08/11/2016   No results found for: DDIMER  Radiology/Studies:  Dg Chest 2 View  Result Date: 11/27/2016 CLINICAL DATA:  Left-sided chest pain EXAM: CHEST  2 VIEW COMPARISON:  Chest radiograph 08/10/2016 FINDINGS: Unchanged cardiomegaly. Mild interstitial prominence is unchanged. No pleural effusion or pneumothorax. No focal consolidation or of overt pulmonary edema. IMPRESSION: Mild cardiomegaly with unchanged interstitial prominence, but no overt pulmonary edema. Electronically Signed   By: Ulyses Jarred M.D.   On: 11/27/2016 22:54    EKG: Interpreted by me showed: Afib, 81 bpm, possible prior anterior infarct, poor R wave progression, low-voltage QRS along precordial leads Telemetry: Interpreted by me showed: Afib, 60s to 80s bpm  Weights: [employer]   11/27/16 2156 11/28/16 0128  Weight: (!) 302 lb (137 kg) (!) 301 lb 11.2 oz (136.9 kg)     Physical Exam: Blood pressure (!) 124/58, pulse 95, temperature 97.9 F (36.6 C), temperature source Oral, resp. rate 18, height 5\' 5"  (1.651 m), weight (!) 301 lb 11.2 oz (136.9 kg), SpO2 93 %. Body mass index is 50.21 kg/m. General: Well developed, well nourished, in no acute distress. Head: Normocephalic, atraumatic, sclera non-icteric, no xanthomas, nares are without discharge.  Neck: Negative for carotid bruits. JVD not elevated. Lungs: Clear bilaterally to auscultation without wheezes, rales, or rhonchi. Breathing is  unlabored. Heart: Irregularly irregular with S1 S2. No murmurs, rubs, or gallops appreciated. Abdomen: Morbidly obese, soft, non-tender, non-distended with normoactive bowel sounds. No hepatomegaly. No rebound/guarding. No obvious abdominal masses. Msk:  Strength and tone appear normal for age. Extremities: No clubbing or cyanosis. No edema. Distal pedal pulses are 2+ and equal bilaterally. Neuro: Alert and oriented X 3. No facial asymmetry. No focal deficit. Moves all extremities spontaneously. Psych:  Responds to questions appropriately with a normal affect.    Assessment and Plan:  Principal Problem:   Persistent atrial fibrillation (HCC) Active Problems:   Atypical chest pain   Morbid obesity (HCC)   Essential hypertension   Musculoskeletal arm pain   Anxiety and depression   GERD (gastroesophageal reflux disease)    1. Persistent Afib s/p DCCV 08/21/16 s/p repeat DCCV 11/05/16: -Remains in Afib, rate controlled -Will have EP see her inpatient on 5/22 for further recommendations  -Doubtful she will be a great candidate for Afib ablation given her obesity -Given controlled heart rates currently, will continue amiodarone 200 mg daily, bisoprolol 10 mg daily -Previously on digoxin while in Afib, though s/p DCCV was bradycardic and this was held. She mentions also holding amiodarone -Continue Eliquis 5 mg bid -CHADS2VASc at least 2 (HTN, female) -Needs outpatient sleep study -Likely in the setting of her morbid obesity +/- sleep apnea -Replete potassium to goal of at least 4.0 -Check magnesium and replete to goal of at least 2.0 as indicated -Recent TSH normal in 07/2016  2. Atypical chest/arm pain: -Worse with positional movements -Reproducible to palpation on exam -Recent nuclear stress test low risk -No plans for ischemic evaluation at this time -Per IM  3. HTN: -Controlled  4. HLD: -Statin  5. Morbid obesity/possible sleep apnea: -Weight loss advised -Outpatient  sleep study advised   Signed, Christell Faith, PA-C [place] Pager: [phone] 11/28/2016, 8:14 AM

## 2016-11-28 NOTE — Progress Notes (Signed)
D/c continuous fluids per Dr. Tressia Miners.

## 2016-11-28 NOTE — H&P (Signed)
[place] [facility] [place] NAME: Amber Barnes    MR#:  [id number]  DATE OF BIRTH:  1950/11/30  DATE OF ADMISSION:  11/27/2016  PRIMARY CARE PHYSICIAN: [name], FNP   REQUESTING/REFERRING PHYSICIAN: Beather Arbour, MD  CHIEF COMPLAINT:   Chief Complaint  Patient presents with  . Chest Pain    HISTORY OF PRESENT ILLNESS:  Amber Barnes  is a 66 y.o. female who presents with Chest pain.  Patient states that she's had the pain off and on for the past couple of days, it radiates to her left arm. However today, it was very persistent and would not go away.  She has been followed by cardiology for persistent A. fib, but has no prior history of coronary artery disease or heart attack. Initial workup in the ED today was within normal limits, hospitalists were called for further evaluation  PAST MEDICAL HISTORY:   Past Medical History:  Diagnosis Date  . Achilles tendinitis   . Acute medial meniscal tear   . Allergy   . Anxiety   . Arthritis   . Atrial fibrillation ([place])    a. new onset 07/2016; b. CHADS2VASc --> 2 (HTN, female); c. eliquis  . Chronic lower back pain   . COPD (chronic obstructive pulmonary disease) ([place])   . Depression   . Fibrocystic breast disease   . GERD (gastroesophageal reflux disease)   . Hyperlipidemia   . Hypertension   . Insomnia   . Lipoma   . Osteoarthritis    left knee  . Osteopenia   . Tendonitis   . Thyroid disease   . Vitamin D deficiency     PAST SURGICAL HISTORY:   Past Surgical History:  Procedure Laterality Date  . ABDOMINAL HYSTERECTOMY     ovaries left  . ACHILLES TENDON SURGERY    . BACK SURGERY    . CARDIOVERSION N/A 09/07/2016   Procedure: CARDIOVERSION;  Surgeon: Minna Merritts, MD;  Location: [facility] ORS;  Service: Cardiovascular;  Laterality: N/A;  . CARDIOVERSION N/A 10/19/2016   Procedure: CARDIOVERSION;  Surgeon: Minna Merritts, MD;  Location: [facility] ORS;  Service:  Cardiovascular;  Laterality: N/A;  . COLONOSCOPY    . ESOPHAGOGASTRODUODENOSCOPY    . THYROIDECTOMY     thyroid lobectomy secondary to a benign nodule    SOCIAL HISTORY:   Social History  Substance Use Topics  . Smoking status: Former Smoker    Packs/day: 0.25    Years: 10.00    Types: Cigarettes  . Smokeless tobacco: Former Systems developer    Quit date: 07/11/2001     Comment: smoked for 10 years  . Alcohol use Yes     Comment: social, occasional    FAMILY HISTORY:   Family History  Problem Relation Age of Onset  . Cancer Sister 9       ovarian  . Cancer Father 49       colon  . Heart disease Father   . Heart attack Father 31       died from MI  . Hyperlipidemia Father   . Hypertension Father   . Cancer Mother        leukemia  . Breast cancer Paternal Aunt        55's    DRUG ALLERGIES:   Allergies  Allergen Reactions  . Prednisone Other (See Comments)    Agitation, hyperactivity, polyphagia  . Hydrocodone-Acetaminophen Other (See Comments)    Hallucinations and combative  .  Adhesive [Tape] Itching and Rash  . Latex Itching and Rash    use paper tape    MEDICATIONS AT HOME:   Prior to Admission medications   Medication Sig Start Date End Date Taking? Authorizing Provider  amiodarone (PACERONE) 200 MG tablet Take 1 tablet (200 mg total) by mouth 2 (two) times daily. 09/20/16  Yes Gollan, [name], MD  apixaban (ELIQUIS) 5 MG TABS tablet Take 1 tablet (5 mg total) by mouth 2 (two) times daily. 09/27/16  Yes Minna Merritts, MD  B Complex Vitamins (VITAMIN B COMPLEX PO) Take 1 tablet by mouth daily.    Yes [provider]  bisoprolol (ZEBETA) 10 MG tablet Take 1 tablet (10 mg total) by mouth 2 (two) times daily. 09/29/16  Yes Gollan, [name], MD  busPIRone (BUSPAR) 10 MG tablet Take 1 tablet (10 mg total) by mouth 2 (two) times daily. 10/31/16  Yes Arnett, Yvetta Coder, FNP  calcium carbonate (TUMS - DOSED IN MG ELEMENTAL CALCIUM) 500 MG chewable tablet Chew 1  tablet by mouth daily.   Yes [provider]  Cholecalciferol (VITAMIN D PO) Take 5,000 mg by mouth daily.    Yes [provider]  DULoxetine (CYMBALTA) 60 MG capsule Take 2 capsules (120 mg total) by mouth daily. 09/30/16  Yes Arnett, Yvetta Coder, FNP  furosemide (LASIX) 20 MG tablet Take 1 tablet (20 mg total) by mouth 2 (two) times daily. 09/12/16  Yes Minna Merritts, MD  Multiple Vitamin (MULTIVITAMIN) tablet Take 1 tablet by mouth daily.   Yes [provider]  oxymetazoline (AFRIN) 0.05 % nasal spray Place 1 spray into both nostrils 2 (two) times daily as needed for congestion.   Yes [provider]  potassium chloride (KLOR-CON 10) 10 MEQ tablet Take 1 tablet (10 mEq total) by mouth 2 (two) times daily. 09/22/16  Yes Minna Merritts, MD  rosuvastatin (CRESTOR) 20 MG tablet Take 1 tablet (20 mg total) by mouth daily. 11/07/16  Yes Arnett, Yvetta Coder, FNP  sennosides-docusate sodium (SENOKOT-S) 8.6-50 MG tablet Take 1 tablet by mouth at bedtime as needed for constipation.    Yes [provider]    REVIEW OF SYSTEMS:  Review of Systems  Constitutional: Negative for chills, fever, malaise/fatigue and weight loss.  HENT: Negative for ear pain, hearing loss and tinnitus.   Eyes: Negative for blurred vision, double vision, pain and redness.  Respiratory: Positive for shortness of breath. Negative for cough and hemoptysis.   Cardiovascular: Positive for chest pain. Negative for palpitations, orthopnea and leg swelling.  Gastrointestinal: Negative for abdominal pain, constipation, diarrhea, nausea and vomiting.  Genitourinary: Negative for dysuria, frequency and hematuria.  Musculoskeletal: Negative for back pain, joint pain and neck pain.  Skin:       No acne, rash, or lesions  Neurological: Negative for dizziness, tremors, focal weakness and weakness.  Endo/Heme/Allergies: Negative for polydipsia. Does not bruise/bleed easily.   Psychiatric/Behavioral: Negative for depression. The patient is not nervous/anxious and does not have insomnia.      VITAL SIGNS:   Vitals:   11/27/16 2156 11/27/16 2217 11/27/16 2300 11/28/16 0000  BP:  129/83 123/73 (!) 140/57  Pulse:  72 78 82  Resp:  20 20 17   Temp:      TempSrc:      SpO2:  95% 94% 95%  Weight: (!) 137 kg (302 lb)     Height: 5\' 5"  (1.651 m)      Wt Readings from Last  3 Encounters:  11/27/16 (!) 137 kg (302 lb)  11/14/16 (!) 137.3 kg (302 lb 12 oz)  10/25/16 135.4 kg (298 lb 8 oz)    PHYSICAL EXAMINATION:  Physical Exam  Vitals reviewed. Constitutional: She is oriented to person, place, and time. She appears well-developed and well-nourished. No distress.  HENT:  Head: Normocephalic and atraumatic.  Mouth/Throat: Oropharynx is clear and moist.  Eyes: Conjunctivae and EOM are normal. Pupils are equal, round, and reactive to light. No scleral icterus.  Neck: Normal range of motion. Neck supple. No JVD present. No thyromegaly present.  Cardiovascular: Normal rate, regular rhythm and intact distal pulses.  Exam reveals no gallop and no friction rub.   No murmur heard. Respiratory: Effort normal and breath sounds normal. No respiratory distress. She has no wheezes. She has no rales.  GI: Soft. Bowel sounds are normal. She exhibits no distension. There is no tenderness.  Musculoskeletal: Normal range of motion. She exhibits no edema.  No arthritis, no gout  Lymphadenopathy:    She has no cervical adenopathy.  Neurological: She is alert and oriented to person, place, and time. No cranial nerve deficit.  No dysarthria, no aphasia  Skin: Skin is warm and dry. No rash noted. No erythema.  Psychiatric: She has a normal mood and affect. Her behavior is normal. Judgment and thought content normal.    LABORATORY PANEL:   CBC  Recent Labs Lab 11/27/16 2228  WBC 9.6  HGB 13.7  HCT 40.5  PLT 212    ------------------------------------------------------------------------------------------------------------------  Chemistries   Recent Labs Lab 11/27/16 2228  NA 140  K 3.6  CL 106  CO2 26  GLUCOSE 157*  BUN 18  CREATININE 0.82  CALCIUM 8.7*  AST 27  ALT 25  ALKPHOS 75  BILITOT 0.4   ------------------------------------------------------------------------------------------------------------------  Cardiac Enzymes  Recent Labs Lab 11/27/16 2228  TROPONINI <0.03   ------------------------------------------------------------------------------------------------------------------  RADIOLOGY:  Dg Chest 2 View  Result Date: 11/27/2016 CLINICAL DATA:  Left-sided chest pain EXAM: CHEST  2 VIEW COMPARISON:  Chest radiograph 08/10/2016 FINDINGS: Unchanged cardiomegaly. Mild interstitial prominence is unchanged. No pleural effusion or pneumothorax. No focal consolidation or of overt pulmonary edema. IMPRESSION: Mild cardiomegaly with unchanged interstitial prominence, but no overt pulmonary edema. Electronically Signed   By: Ulyses Jarred M.D.   On: 11/27/2016 22:54    EKG:   Orders placed or performed during the hospital encounter of 11/27/16  . EKG 12-Lead  . EKG 12-Lead    IMPRESSION AND PLAN:  Principal Problem:   Chest pain - currently improved. Initial workup in the ED within normal limits, however we'll trend her cardiac enzymes tonight, get a cardiology consult the morning. Active Problems:   Essential hypertension - continue home meds   Persistent atrial fibrillation (HCC) - currently rate controlled, continue home meds including anticoagulation   Anxiety and depression - home dose medications  All the records are reviewed and case discussed with ED provider. Management plans discussed with the patient and/or family.  DVT PROPHYLAXIS: Systemic anticoagulation  GI PROPHYLAXIS: None  ADMISSION STATUS: Observation  CODE STATUS: Full Code Status History     Date Active Date Inactive Code Status Order ID Comments User Context   08/11/2016 12:36 AM 08/11/2016  8:59 PM Full Code [id number]  Harvie Bridge, DO Inpatient   07/27/2016  3:25 PM 07/29/2016  8:33 PM Full Code [id number]  Henreitta Leber, MD Inpatient      TOTAL TIME TAKING CARE OF THIS PATIENT: 40 minutes.  Amber Barnes 11/28/2016, 12:37 AM  [facility]  Office  [phone]  CC: Primary care physician; [name], FNP  Note:  This document was prepared using Dragon voice recognition software and may include unintentional dictation errors.

## 2016-11-29 ENCOUNTER — Telehealth: Payer: Self-pay

## 2016-11-29 DIAGNOSIS — I481 Persistent atrial fibrillation: Secondary | ICD-10-CM | POA: Diagnosis not present

## 2016-11-29 DIAGNOSIS — R079 Chest pain, unspecified: Secondary | ICD-10-CM | POA: Diagnosis not present

## 2016-11-29 LAB — BASIC METABOLIC PANEL
Anion gap: 8 (ref 5–15)
BUN: 17 mg/dL (ref 6–20)
CO2: 28 mmol/L (ref 22–32)
Calcium: 8.8 mg/dL — ABNORMAL LOW (ref 8.9–10.3)
Chloride: 105 mmol/L (ref 101–111)
Creatinine, Ser: 0.76 mg/dL (ref 0.44–1.00)
GFR calc Af Amer: >60 mL/min (ref >60)
GFR calc non Af Amer: >60 mL/min (ref >60)
Glucose, Bld: 98 mg/dL (ref 65–99)
Potassium: 4 mmol/L (ref 3.5–5.1)
Sodium: 141 mmol/L (ref 135–145)

## 2016-11-29 LAB — MAGNESIUM: Magnesium: 2.1 mg/dL (ref 1.7–2.4)

## 2016-11-29 MED ORDER — RANOLAZINE ER 500 MG PO TB12: 500.0000 mg | ORAL_TABLET | Freq: Two times a day (BID) | ORAL | 2 refills | Status: DC

## 2016-11-29 NOTE — Discharge Summary (Addendum)
[facility] NAME: Amber Barnes    MR#:  [id number]  DATE OF BIRTH:  16-Apr-1951  DATE OF ADMISSION:  11/27/2016   ADMITTING PHYSICIAN: Lance Coon, MD  DATE OF DISCHARGE: 11/29/2016  PRIMARY CARE PHYSICIAN: [name], FNP   ADMISSION DIAGNOSIS:   Persistent atrial fibrillation (HCC) [I48.1] Chest pain, unspecified type [R07.9]  DISCHARGE DIAGNOSIS:   Principal Problem:   Persistent atrial fibrillation (HCC) Active Problems:   Morbid obesity (HCC)   Essential hypertension   Anxiety and depression   GERD (gastroesophageal reflux disease)   Atypical chest pain   Musculoskeletal arm pain   A-fib ([place])   SECONDARY DIAGNOSIS:   Past Medical History:  Diagnosis Date  . Achilles tendinitis   . Acute medial meniscal tear   . Allergy   . Anxiety   . Arthritis   . Atrial fibrillation ([place])    a. new onset 07/2016; b. CHADS2VASc --> 2 (HTN, female); c. eliquis  . Chronic lower back pain   . COPD (chronic obstructive pulmonary disease) ([place])   . Depression   . Fibrocystic breast disease   . GERD (gastroesophageal reflux disease)   . Hyperlipidemia   . Hypertension   . Insomnia   . Lipoma   . Osteoarthritis    left knee  . Osteopenia   . Tendonitis   . Thyroid disease   . Vitamin D deficiency     HOSPITAL COURSE:   66 year old female with past medical history significant for paroxysmal atrial fibrillation status post cardioversion twice in the last 2 months, obesity,hypertension, hyperlipidemia, hypothyroidism presents to the hospital secondary to chest tightness.  #1 Symptomatic atrial fibrillation-patient was diagnosed with A. Fib in January 2018 and she has always had symptoms associated with that. She had cardioversion once in February and second one in April 2018. She remains on eliquis. -However her symptoms recurred again on 11/14/2016 and EKG showed that she converted into A. Fib  again. -cardiology consulted. Will need sleep study as outpatient - continue amiodarone - Dr. [name] to follow up as outpatient and adjust meds until sleep apnea ruled out or treated - he recommended to start her on ranexa bid  #2 Chest pain- likely from afib as it relieves when she is cardioverted and converted to sinus rhythm Last myoview in Jan 2018- with no reversible ischemic changes The arm pain- sounds musculoskeletal No further ischemia work up  #3 hypertension-continue bisoprolol. -also on Lasix.  #4 depression and anxiety-on Cymbalta and BuSpar  Anticipate discharge today  DISCHARGE CONDITIONS:   Guarded  CONSULTS OBTAINED:   Treatment Team:  Wellington Hampshire, MD Deboraha Sprang, MD  DRUG ALLERGIES:   Allergies  Allergen Reactions  . Prednisone Other (See Comments)    Agitation, hyperactivity, polyphagia  . Hydrocodone-Acetaminophen Other (See Comments)    Hallucinations and combative  . Adhesive [Tape] Itching and Rash  . Latex Itching and Rash    use paper tape   DISCHARGE MEDICATIONS:   Allergies as of 11/29/2016      Reactions   Prednisone Other (See Comments)   Agitation, hyperactivity, polyphagia   Hydrocodone-acetaminophen Other (See Comments)   Hallucinations and combative   Adhesive [tape] Itching, Rash   Latex Itching, Rash   use paper tape      Medication List    TAKE these medications   amiodarone 200 MG tablet Commonly known as:  PACERONE Take 1 tablet (200 mg total) by mouth  2 (two) times daily.   apixaban 5 MG Tabs tablet Commonly known as:  ELIQUIS Take 1 tablet (5 mg total) by mouth 2 (two) times daily.   bisoprolol 10 MG tablet Commonly known as:  ZEBETA Take 1 tablet (10 mg total) by mouth 2 (two) times daily.   busPIRone 10 MG tablet Commonly known as:  BUSPAR Take 1 tablet (10 mg total) by mouth 2 (two) times daily.   calcium carbonate 500 MG chewable tablet Commonly known as:  TUMS - dosed in mg elemental  calcium Chew 1 tablet by mouth daily.   DULoxetine 60 MG capsule Commonly known as:  CYMBALTA Take 2 capsules (120 mg total) by mouth daily.   furosemide 20 MG tablet Commonly known as:  LASIX Take 1 tablet (20 mg total) by mouth 2 (two) times daily.   multivitamin tablet Take 1 tablet by mouth daily.   oxymetazoline 0.05 % nasal spray Commonly known as:  AFRIN Place 1 spray into both nostrils 2 (two) times daily as needed for congestion.   potassium chloride 10 MEQ tablet Commonly known as:  KLOR-CON 10 Take 1 tablet (10 mEq total) by mouth 2 (two) times daily.   ranolazine 500 MG 12 hr tablet Commonly known as:  RANEXA Take 1 tablet (500 mg total) by mouth 2 (two) times daily.   rosuvastatin 20 MG tablet Commonly known as:  CRESTOR Take 1 tablet (20 mg total) by mouth daily.   sennosides-docusate sodium 8.6-50 MG tablet Commonly known as:  SENOKOT-S Take 1 tablet by mouth at bedtime as needed for constipation.   VITAMIN B COMPLEX PO Take 1 tablet by mouth daily.   VITAMIN D PO Take 5,000 mg by mouth daily.        DISCHARGE INSTRUCTIONS:   1. PCP follow-up in 1-2 weeks 2. Cardiology follow-up in 1 week 3. Will need sleep study ASAP as outpatient  DIET:   Cardiac diet  ACTIVITY:   Activity as tolerated  OXYGEN:   Home Oxygen: No.  Oxygen Delivery: room air  DISCHARGE LOCATION:   home   If you experience worsening of your admission symptoms, develop shortness of breath, life threatening emergency, suicidal or homicidal thoughts you must seek medical attention immediately by calling 911 or calling your MD immediately  if symptoms less severe.  You Must read complete instructions/literature along with all the possible adverse reactions/side effects for all the Medicines you take and that have been prescribed to you. Take any new Medicines after you have completely understood and accpet all the possible adverse reactions/side effects.   Please  note  You were cared for by a hospitalist during your hospital stay. If you have any questions about your discharge medications or the care you received while you were in the hospital after you are discharged, you can call the unit and asked to speak with the hospitalist on call if the hospitalist that took care of you is not available. Once you are discharged, your primary care physician will handle any further medical issues. Please note that NO REFILLS for any discharge medications will be authorized once you are discharged, as it is imperative that you return to your primary care physician (or establish a relationship with a primary care physician if you do not have one) for your aftercare needs so that they can reassess your need for medications and monitor your lab values.    On the day of Discharge:  VITAL SIGNS:   Blood pressure (!) 156/81, pulse  97, temperature 98.4 F (36.9 C), temperature source Oral, resp. rate 19, height 5\' 5"  (1.651 m), weight (!) 136.9 kg (301 lb 11.2 oz), SpO2 95 %.  PHYSICAL EXAMINATION:   GENERAL:  66 y.o.-year-old obese patient lying in the bed with no acute distress.  EYES: Pupils equal, round, reactive to light and accommodation. No scleral icterus. Extraocular muscles intact.  HEENT: Head atraumatic, normocephalic. Oropharynx and nasopharynx clear.  NECK:  Supple, no jugular venous distention. No thyroid enlargement, no tenderness.  LUNGS: Normal breath sounds bilaterally, no wheezing, rales,rhonchi or crepitation. No use of accessory muscles of respiration.  CARDIOVASCULAR: S1, S2 normal. No murmurs, rubs, or gallops.  ABDOMEN: Soft, nontender, nondistended. Bowel sounds present. No organomegaly or mass.  EXTREMITIES: No cyanosis, or clubbing. 1+ trace pedal edema noted NEUROLOGIC: Cranial nerves II through XII are intact. Muscle strength 5/5 in all extremities. Sensation intact. Gait not checked.  PSYCHIATRIC: The patient is alert and oriented x 3.   SKIN: No obvious rash, lesion, or ulcer.    DATA REVIEW:   CBC  Recent Labs Lab 11/28/16 0724  WBC 8.6  HGB 13.3  HCT 39.6  PLT 183    Chemistries   Recent Labs Lab 11/27/16 2228  11/29/16 0343  NA 140  < > 141  K 3.6  < > 4.0  CL 106  < > 105  CO2 26  < > 28  GLUCOSE 157*  < > 98  BUN 18  < > 17  CREATININE 0.82  < > 0.76  CALCIUM 8.7*  < > 8.8*  MG  --   < > 2.1  AST 27  --   --   ALT 25  --   --   ALKPHOS 75  --   --   BILITOT 0.4  --   --   < > = values in this interval not displayed.   Microbiology Results  No results found for this or any previous visit.  RADIOLOGY:  No results found.   Management plans discussed with the patient, family and they are in agreement.  CODE STATUS:     Code Status Orders        Start     Ordered   11/28/16 0124  Full code  Continuous     11/28/16 0124    Code Status History    Date Active Date Inactive Code Status Order ID Comments User Context   08/11/2016 12:36 AM 08/11/2016  8:59 PM Full Code [id number]  Harvie Bridge, DO Inpatient   07/27/2016  3:25 PM 07/29/2016  8:33 PM Full Code [id number]  Henreitta Leber, MD Inpatient    Advance Directive Documentation     Most Recent Value  Type of Advance Directive  Healthcare Power of [place], Living will  Pre-existing out of facility DNR order (yellow form or pink MOST form)  -  "MOST" Form in Place?  -      TOTAL TIME TAKING CARE OF THIS PATIENT: 38 minutes.    Gladstone Lighter M.D on 11/29/2016 at 1:09 PM  Between 7am to 6pm - Pager - [phone]  After 6pm go to [email or web address] - Proofreader  [facility]  Office  [phone]  CC: Primary care physician; [name], FNP   Note: This dictation was prepared with Dragon dictation along with smaller phrase technology. Any transcriptional errors that result from this process are unintentional.

## 2016-11-29 NOTE — Telephone Encounter (Signed)
Per Dr. [name], ok to sched 6/6. Will call tomorrow, as scheduling is closed now.

## 2016-11-29 NOTE — Telephone Encounter (Signed)
Spoke w/ pt.  She would prefer for Dr. [name] to perform her procedure.  She would like to make sure that she is ok waiting that long.  You are Rounds Only on Wed, June 6 - can I set her up then?

## 2016-11-29 NOTE — Telephone Encounter (Signed)
There are no more available times for DCCV this week, and Dr. [name] will be out of town next week.  Left message for pt to call back and discuss if she would like for Dr. Fletcher Anon or End to perform her DCCV or wait for Dr. [name] to be available.

## 2016-11-29 NOTE — Consult Note (Signed)
ELECTROPHYSIOLOGY CONSULT NOTE  Patient ID: Genesis Novosad, MRN: [id number], DOB/AGE: Jul 30, 1950 66 y.o. Admit date: 11/27/2016 Date of Consult: 11/29/2016  Primary Physician: [name], FNP Primary Cardiologist: TG  Chief Complaint: Atrial fibrillation    HPI Reita Shindler is a 66 y.o. female  Seen for persistent atrial fibrillation at the request of DrTG   She has a history of cardioversion earlier this year. One was undertaken with flecainide and the other with amiodarone. This was started 3/18  Most recently cardioversion  4 /18;   she had been maintained on amiodarone for a whlie then this was stopped because of bradycardia. There is any note 11/14/16 discussing its resumption after she reverted to atrial fibrillation   She was considerably better in sinus for those few weeks than withrecurrent afib.  She now complains of fatigue and weakness She was admitted on this occasion for episodic L sided chest pain   Echocardiogram 1/18 demonstrated normal LV function and mild concentric LVH and mild LAE   Past Medical History:  Diagnosis Date  . Achilles tendinitis   . Acute medial meniscal tear   . Allergy   . Anxiety   . Arthritis   . Atrial fibrillation ([place])    a. new onset 07/2016; b. CHADS2VASc --> 2 (HTN, female); c. eliquis  . Chronic lower back pain   . COPD (chronic obstructive pulmonary disease) ([place])   . Depression   . Fibrocystic breast disease   . GERD (gastroesophageal reflux disease)   . Hyperlipidemia   . Hypertension   . Insomnia   . Lipoma   . Osteoarthritis    left knee  . Osteopenia   . Tendonitis   . Thyroid disease   . Vitamin D deficiency       Surgical History:  Past Surgical History:  Procedure Laterality Date  . ABDOMINAL HYSTERECTOMY     ovaries left  . ACHILLES TENDON SURGERY    . BACK SURGERY    . CARDIOVERSION N/A 09/07/2016   Procedure: CARDIOVERSION;  Surgeon: Minna Merritts, MD;  Location: [facility] ORS;  Service:  Cardiovascular;  Laterality: N/A;  . CARDIOVERSION N/A 10/19/2016   Procedure: CARDIOVERSION;  Surgeon: Minna Merritts, MD;  Location: [facility] ORS;  Service: Cardiovascular;  Laterality: N/A;  . COLONOSCOPY    . ESOPHAGOGASTRODUODENOSCOPY    . THYROIDECTOMY     thyroid lobectomy secondary to a benign nodule     Home Meds: Prior to Admission medications   Medication Sig Start Date End Date Taking? Authorizing Provider  amiodarone (PACERONE) 200 MG tablet Take 1 tablet (200 mg total) by mouth 2 (two) times daily. 09/20/16  Yes Gollan, [name], MD  apixaban (ELIQUIS) 5 MG TABS tablet Take 1 tablet (5 mg total) by mouth 2 (two) times daily. 09/27/16  Yes Minna Merritts, MD  B Complex Vitamins (VITAMIN B COMPLEX PO) Take 1 tablet by mouth daily.    Yes [provider]  bisoprolol (ZEBETA) 10 MG tablet Take 1 tablet (10 mg total) by mouth 2 (two) times daily. 09/29/16  Yes Gollan, [name], MD  busPIRone (BUSPAR) 10 MG tablet Take 1 tablet (10 mg total) by mouth 2 (two) times daily. 10/31/16  Yes Arnett, Yvetta Coder, FNP  calcium carbonate (TUMS - DOSED IN MG ELEMENTAL CALCIUM) 500 MG chewable tablet Chew 1 tablet by mouth daily.   Yes [provider]  Cholecalciferol (VITAMIN D PO) Take 5,000 mg by mouth daily.    Yes [provider]  DULoxetine (CYMBALTA) 60 MG capsule Take 2 capsules (120 mg total) by mouth daily. 09/30/16  Yes Arnett, Yvetta Coder, FNP  furosemide (LASIX) 20 MG tablet Take 1 tablet (20 mg total) by mouth 2 (two) times daily. 09/12/16  Yes Minna Merritts, MD  Multiple Vitamin (MULTIVITAMIN) tablet Take 1 tablet by mouth daily.   Yes [provider]  oxymetazoline (AFRIN) 0.05 % nasal spray Place 1 spray into both nostrils 2 (two) times daily as needed for congestion.   Yes [provider]  potassium chloride (KLOR-CON 10) 10 MEQ tablet Take 1 tablet (10 mEq total) by mouth 2 (two) times daily. 09/22/16  Yes Minna Merritts, MD    rosuvastatin (CRESTOR) 20 MG tablet Take 1 tablet (20 mg total) by mouth daily. 11/07/16  Yes Arnett, Yvetta Coder, FNP  sennosides-docusate sodium (SENOKOT-S) 8.6-50 MG tablet Take 1 tablet by mouth at bedtime as needed for constipation.    Yes [provider]    Inpatient Medications:  . amiodarone  200 mg Oral BID  . apixaban  5 mg Oral BID  . bisoprolol  10 mg Oral BID  . busPIRone  10 mg Oral BID  . DULoxetine  120 mg Oral Daily  . furosemide  20 mg Oral BID  . rosuvastatin  20 mg Oral Daily  . sodium chloride flush  3 mL Intravenous Q12H      Allergies:  Allergies  Allergen Reactions  . Prednisone Other (See Comments)    Agitation, hyperactivity, polyphagia  . Hydrocodone-Acetaminophen Other (See Comments)    Hallucinations and combative  . Adhesive [Tape] Itching and Rash  . Latex Itching and Rash    use paper tape    Social History   Social History  . Marital status: Married    Spouse name: N/A  . Number of children: N/A  . Years of education: N/A   Occupational History  . Not on file.   Social History Main Topics  . Smoking status: Former Smoker    Packs/day: 0.25    Years: 10.00    Types: Cigarettes  . Smokeless tobacco: Former Systems developer    Quit date: 07/11/2001     Comment: smoked for 10 years  . Alcohol use Yes     Comment: social, occasional  . Drug use: No  . Sexual activity: Not on file   Other Topics Concern  . Not on file   Social History Narrative   Moved to [place] from [place].       Grandmother.          Family History  Problem Relation Age of Onset  . Cancer Sister 27       ovarian  . Cancer Father 56       colon  . Heart disease Father   . Heart attack Father 63       died from MI  . Hyperlipidemia Father   . Hypertension Father   . Cancer Mother        leukemia  . Breast cancer Paternal Aunt        62's     ROS:  Please see the history of present illness.     All other systems reviewed and negative.     Physical Exam:  Blood pressure (!) 156/81, pulse 97, temperature 98.4 F (36.9 C), temperature source Oral, resp. rate 19, height 5\' 5"  (1.651 m), weight (!) 301 lb 11.2 oz (136.9 kg), SpO2 95 %. General: Well developed,  well nourished female in no acute distress. Head: Normocephalic, atraumatic, sclera non-icteric, no xanthomas, nares are without discharge. EENT: normal Lymph Nodes:  none Back: without scoliosis/kyphosis, no CVA tendersness Neck: Negative for carotid bruits. JVD not elevated. Lungs: Clear bilaterally to auscultation without wheezes, rales, or rhonchi. Breathing is unlabored. Heart: Irregularly irregular  murmur , rubs, or gallops appreciated. Abdomen: Soft, non-tender, non-distended with normoactive bowel sounds. No hepatomegaly. No rebound/guarding. No obvious abdominal masses. Msk:  Strength and tone appear normal for age. Extremities: No clubbing or cyanosis. 1+ edema.  Distal pedal pulses are 2+ and equal bilaterally. Skin: Warm and Dry Neuro: Alert and oriented X 3. CN III-XII intact Grossly normal sensory and motor function . Psych:  Responds to questions appropriately with a normal affect.      Labs: Cardiac Enzymes  Recent Labs  11/27/16 2228 11/28/16 0124 11/28/16 0724 11/28/16 1322  TROPONINI <0.03 <0.03 <0.03 <0.03   CBC Lab Results  Component Value Date   WBC 8.6 11/28/2016   HGB 13.3 11/28/2016   HCT 39.6 11/28/2016   MCV 87.6 11/28/2016   PLT 183 11/28/2016   PROTIME: No results for input(s): LABPROT, INR in the last 72 hours. Chemistry  Recent Labs Lab 11/27/16 2228  11/29/16 0343  NA 140  < > 141  K 3.6  < > 4.0  CL 106  < > 105  CO2 26  < > 28  BUN 18  < > 17  CREATININE 0.82  < > 0.76  CALCIUM 8.7*  < > 8.8*  PROT 6.9  --   --   BILITOT 0.4  --   --   ALKPHOS 75  --   --   ALT 25  --   --   AST 27  --   --   GLUCOSE 157*  < > 98  < > = values in this interval not displayed. Lipids Lab Results  Component Value  Date   CHOL 126 08/11/2016   HDL 45 08/11/2016   LDLCALC 65 08/11/2016   TRIG 82 08/11/2016   BNP No results found for: PROBNP Thyroid Function Tests: No results for input(s): TSH, T4TOTAL, T3FREE, THYROIDAB in the last 72 hours.  Invalid input(s): FREET3    Miscellaneous No results found for: DDIMER  Radiology/Studies:  Dg Chest 2 View  Result Date: 11/27/2016 CLINICAL DATA:  Left-sided chest pain EXAM: CHEST  2 VIEW COMPARISON:  Chest radiograph 08/10/2016 FINDINGS: Unchanged cardiomegaly. Mild interstitial prominence is unchanged. No pleural effusion or pneumothorax. No focal consolidation or of overt pulmonary edema. IMPRESSION: Mild cardiomegaly with unchanged interstitial prominence, but no overt pulmonary edema. Electronically Signed   By: Ulyses Jarred M.D.   On: 11/27/2016 22:54    EKG: afib 81   Assessment and Plan:  Atrial fibrillation-persistent  Sinus bradycardia  Sleep apnea-untreated   She is symptomatic persistent atrial fibrillation.  Unfortunately, with her amiodarone on board, will be unable to use dofetilide in the short term.  Hence, I think we should continue with the amiodarone and had been will not aggravate her tendency towards bradycardia. We can then try and cardiovert her again.  She is not a candidate for catheter ablation at this time given her weight and  untreated sleep apnea.   However, her irregular rhythm able to exercise loosing weight and forget her sleep apnea treated. A candidate for catheter ablation therapy.  Discussed with Dr. [name] who agrees      Virl Axe

## 2016-11-29 NOTE — Telephone Encounter (Signed)
-----   Message from Blain Pais sent at 11/29/2016  1:19 PM EDT ----- Regarding: CARDIOVERSION PER DR [name], THIS PT NEEDS TO BE CARDIOVERTED

## 2016-11-29 NOTE — Progress Notes (Signed)
Patient is discharge home in a stable condition,summary and f/u care given verbalized understanding  

## 2016-11-30 ENCOUNTER — Telehealth: Payer: Self-pay | Admitting: Family

## 2016-11-30 NOTE — Telephone Encounter (Signed)
There is already a DCCV sched for 6/6 - left message for scheduling to call back.

## 2016-11-30 NOTE — Telephone Encounter (Signed)
Spoke w/ Pamala Hurry in scheduling. Per Denny Peon, ok to double book that day. Pt sched for DCCV 12/14/16 @ 8:00. Spoke w/ pt.  She understands to arrive @ 7:00, be NPO after midnight and not to miss any doses of Eliquis. She had labs drawn on 5/21, so no need to repeat.

## 2016-11-30 NOTE — Telephone Encounter (Signed)
Pt called to make a HFU. Pt was in for chest pain and persistent A-Fib. Pt is scheduled for 5/24 @ 11:30.

## 2016-11-30 NOTE — Telephone Encounter (Signed)
Transition Care Management Follow-up Telephone Call  How have you been since you were released from the hospital? CHF with Afib just feels tired denies chest pain some SOB with Exertion    Do you understand why you were in the hospital? yes   Do you understand the discharge instrcutions? yes  Items Reviewed:  Medications reviewed: Yes  Allergies reviewed: Yes  Dietary changes reviewed: yes, Low sodium  Referrals reviewed: Yes   Functional Questionnaire:   Activities of Daily Living (ADLs):   She states they are independent in the following: All ADLS States they require assistance with the following:  No assist needed.   No transportation concerns.   Any patient concerns? no   Confirmed importance and date/time of follow-up visits scheduled:Yes   Confirmed with patient if condition begins to worsen call PCP or go to the ER.  Patient was given the Call-a-Nurse line [phone]: {yes

## 2016-12-01 ENCOUNTER — Encounter: Payer: Self-pay | Admitting: Family

## 2016-12-01 ENCOUNTER — Ambulatory Visit: Payer: Medicare Other | Admitting: Internal Medicine

## 2016-12-01 ENCOUNTER — Ambulatory Visit (INDEPENDENT_AMBULATORY_CARE_PROVIDER_SITE_OTHER): Payer: Medicare Other | Admitting: Family

## 2016-12-01 VITALS — BP 126/78 | HR 87 | Temp 98.0°F | Resp 18 | Ht 65.0 in | Wt 299.5 lb

## 2016-12-01 DIAGNOSIS — I4891 Unspecified atrial fibrillation: Secondary | ICD-10-CM | POA: Diagnosis not present

## 2016-12-01 DIAGNOSIS — M25512 Pain in left shoulder: Secondary | ICD-10-CM

## 2016-12-01 DIAGNOSIS — I10 Essential (primary) hypertension: Secondary | ICD-10-CM

## 2016-12-01 MED ORDER — GABAPENTIN 100 MG PO CAPS: 200.0000 mg | ORAL_CAPSULE | Freq: Every day | ORAL | 1 refills | Status: DC

## 2016-12-01 NOTE — Progress Notes (Signed)
Subjective:    Patient ID: Amber Barnes, female    DOB: [birth year], 66 y.o.   MRN: [id number]  CC: Amber Barnes is a 66 y.o. female who presents today for follow up.   HPI:   TCM call 11/30/16  ED 5/20 for CP. Admitted for symptomatic a fib. Converted to afib. Needs sleep study ranexa bid for angina troponins negative  On eliquis  scheduled for cardioversion 6/6 Dr [name] added amiodarone  No longer having chest wall pain. Does report left arm pain and neck pain with movement with brushing hair, getting dressed for past couple of weeks. Notes when right side. Has had some numbness from cervical spine to 'whole hand', intermittent. Notes fell one month ago which correlates with onset of symptoms and unsure if braced herself with arm. Tried biofreeze which has not helped. Had old tyleonol 3 which did not help.   Concerned to take renexa due to concern  Of arrythmia.   HTN- has been well controlled. Denies  palpitations, dizziness, frequent headaches, changes in vision, or shortness of breath.      HISTORY:  Past Medical History:  Diagnosis Date  . Achilles tendinitis   . Acute medial meniscal tear   . Allergy   . Anxiety   . Arthritis   . Atrial fibrillation ([place])    a. new onset 07/2016; b. CHADS2VASc --> 2 (HTN, female); c. eliquis  . Chronic lower back pain   . COPD (chronic obstructive pulmonary disease) ([place])   . Depression   . Fibrocystic breast disease   . GERD (gastroesophageal reflux disease)   . Hyperlipidemia   . Hypertension   . Insomnia   . Lipoma   . Osteoarthritis    left knee  . Osteopenia   . Tendonitis   . Thyroid disease   . Vitamin D deficiency    Past Surgical History:  Procedure Laterality Date  . ABDOMINAL HYSTERECTOMY     ovaries left  . ACHILLES TENDON SURGERY    . BACK SURGERY    . CARDIOVERSION N/A 09/07/2016   Procedure: CARDIOVERSION;  Surgeon: Minna Merritts, MD;  Location: [facility] ORS;  Service: Cardiovascular;   Laterality: N/A;  . CARDIOVERSION N/A 10/19/2016   Procedure: CARDIOVERSION;  Surgeon: Minna Merritts, MD;  Location: [facility] ORS;  Service: Cardiovascular;  Laterality: N/A;  . COLONOSCOPY    . ESOPHAGOGASTRODUODENOSCOPY    . THYROIDECTOMY     thyroid lobectomy secondary to a benign nodule   Family History  Problem Relation Age of Onset  . Cancer Sister 72       ovarian  . Cancer Father 35       colon  . Heart disease Father   . Heart attack Father 58       died from MI  . Hyperlipidemia Father   . Hypertension Father   . Cancer Mother        leukemia  . Breast cancer Paternal Aunt        40's    Allergies: Prednisone; Hydrocodone-acetaminophen; Adhesive [tape]; and Latex Current Outpatient Prescriptions on File Prior to Visit  Medication Sig Dispense Refill  . amiodarone (PACERONE) 200 MG tablet Take 1 tablet (200 mg total) by mouth 2 (two) times daily. 70 tablet 6  . apixaban (ELIQUIS) 5 MG TABS tablet Take 1 tablet (5 mg total) by mouth 2 (two) times daily. 60 tablet 3  . B Complex Vitamins (VITAMIN B COMPLEX PO) Take 1 tablet by mouth daily.     Marland Kitchen  bisoprolol (ZEBETA) 10 MG tablet Take 1 tablet (10 mg total) by mouth 2 (two) times daily. 60 tablet 3  . busPIRone (BUSPAR) 10 MG tablet Take 1 tablet (10 mg total) by mouth 2 (two) times daily. 90 tablet 1  . calcium carbonate (TUMS - DOSED IN MG ELEMENTAL CALCIUM) 500 MG chewable tablet Chew 1 tablet by mouth daily.    . Cholecalciferol (VITAMIN D PO) Take 5,000 mg by mouth daily.     . DULoxetine (CYMBALTA) 60 MG capsule Take 2 capsules (120 mg total) by mouth daily. 180 capsule 2  . furosemide (LASIX) 20 MG tablet Take 1 tablet (20 mg total) by mouth 2 (two) times daily. 180 tablet 3  . Multiple Vitamin (MULTIVITAMIN) tablet Take 1 tablet by mouth daily.    Marland Kitchen oxymetazoline (AFRIN) 0.05 % nasal spray Place 1 spray into both nostrils 2 (two) times daily as needed for congestion.    . potassium chloride (KLOR-CON 10) 10 MEQ  tablet Take 1 tablet (10 mEq total) by mouth 2 (two) times daily. 60 tablet 6  . ranolazine (RANEXA) 500 MG 12 hr tablet Take 1 tablet (500 mg total) by mouth 2 (two) times daily. 60 tablet 2  . rosuvastatin (CRESTOR) 20 MG tablet Take 1 tablet (20 mg total) by mouth daily. 90 tablet 1  . sennosides-docusate sodium (SENOKOT-S) 8.6-50 MG tablet Take 1 tablet by mouth at bedtime as needed for constipation.     . [DISCONTINUED] omeprazole (PRILOSEC) 20 MG capsule Take 20 mg by mouth daily.     No current facility-administered medications on file prior to visit.     Social History  Substance Use Topics  . Smoking status: Former Smoker    Packs/day: 0.25    Years: 10.00    Types: Cigarettes  . Smokeless tobacco: Former Systems developer    Quit date: 07/11/2001     Comment: smoked for 10 years  . Alcohol use Yes     Comment: social, occasional    Review of Systems  Constitutional: Negative for chills and fever.  Eyes: Negative for visual disturbance.  Respiratory: Negative for cough, shortness of breath and wheezing.   Cardiovascular: Negative for chest pain and palpitations.  Gastrointestinal: Negative for nausea and vomiting.  Musculoskeletal: Negative for back pain.  Neurological: Positive for numbness.      Objective:    BP 126/78 (BP Location: Left Arm, Patient Position: Sitting, Cuff Size: Large)   Pulse 87   Temp 98 F (36.7 C) (Oral)   Resp 18   Ht 5\' 5"  (1.651 m)   Wt 299 lb 8 oz (135.9 kg)   SpO2 94%   BMI 49.84 kg/m  BP Readings from Last 3 Encounters:  12/01/16 126/78  11/29/16 (!) 156/81  10/25/16 (!) 152/78   Wt Readings from Last 3 Encounters:  12/01/16 299 lb 8 oz (135.9 kg)  11/28/16 (!) 301 lb 11.2 oz (136.9 kg)  11/14/16 (!) 302 lb 12 oz (137.3 kg)    Physical Exam  Constitutional: She appears well-developed and well-nourished.  Eyes: Conjunctivae are normal.  Cardiovascular: Normal rate, normal heart sounds and normal pulses.  An irregularly irregular rhythm  present.  Pulmonary/Chest: Effort normal and breath sounds normal. She has no wheezes. She has no rhonchi. She has no rales.  Musculoskeletal:       Left shoulder: She exhibits normal range of motion, no tenderness, no bony tenderness, no swelling, no effusion, no deformity, no pain, no spasm and normal strength.  Left  Shoulder:   No asymmetry of shoulders when comparing right and left.No pain with palpation over glenohumeral joint lines, Powell joint, AC joint, or bicipital groove. No pain with internal and external rotation. No pain with resisted lateral extension .   Negative active painful arc sign. Negative passive arc ( Neer's). Negative drop arm. No pain, swelling, or ecchymosis noted over long head of biceps. Bicep pain with resisted flexion and extension.  Negative speeds test. Negative Popeye's sign.   Strength and sensation normal BUE's.   Neurological: She is alert.  Skin: Skin is warm and dry.  Psychiatric: She has a normal mood and affect. Her speech is normal and behavior is normal. Thought content normal.  Vitals reviewed.      Assessment & Plan:   Problem List Items Addressed This Visit      Cardiovascular and Mediastinum   Essential hypertension    At goal. Continue current regimen.      A-fib ([place]) - Primary    Irregular rhythm.pending ablation. Advised to call dr [name] to discuss concerns with anti arrhythmic, ranexa, causing an arrhythmia.       Relevant Orders   Ambulatory referral to Sleep Studies     Other   Acute pain of left shoulder    Symptoms consistent with rotator cuff pathology. Discussed trial of gabapentin for such as nerve pain. Patient declined MRI of cervical spine, left shoulder at this time she would like to have ablation first. Advised over-the-counter topical remedies including Aspercreme, heat, ice.      Relevant Medications   gabapentin (NEURONTIN) 100 MG capsule       I am having Ms. Wrightson start on gabapentin. I am also having her  maintain her Cholecalciferol (VITAMIN D PO), multivitamin, B Complex Vitamins (VITAMIN B COMPLEX PO), furosemide, amiodarone, potassium chloride, apixaban, bisoprolol, DULoxetine, oxymetazoline, sennosides-docusate sodium, busPIRone, rosuvastatin, calcium carbonate, and ranolazine.   Meds ordered this encounter  Medications  . gabapentin (NEURONTIN) 100 MG capsule    Sig: Take 2 capsules (200 mg total) by mouth at bedtime.    Dispense:  90 capsule    Refill:  1    Order Specific Question:   Supervising Provider    Answer:   Crecencio Mc [[id number]]    Return precautions given.   Risks, benefits, and alternatives of the medications and treatment plan prescribed today were discussed, and patient expressed understanding.   Education regarding symptom management and diagnosis given to patient on AVS.  Continue to follow with [name], FNP for routine health maintenance.   [name] and I agreed with plan.   [name], FNP

## 2016-12-01 NOTE — Assessment & Plan Note (Signed)
Irregular rhythm.pending ablation. Advised to call dr [name] to discuss concerns with anti arrhythmic, ranexa, causing an arrhythmia.

## 2016-12-01 NOTE — Assessment & Plan Note (Signed)
At goal. Continue current regimen. 

## 2016-12-01 NOTE — Patient Instructions (Addendum)
Suspect rotator cuff pathology   Trial tyelonol arthritis for pain. Heat and ice.   Trial gabapentin  Let me know about shoulder/neck pain and if worsens or changes in presentation- we can pursue MRI, PT, orthopedics'  Pleasure seeing you

## 2016-12-01 NOTE — Assessment & Plan Note (Addendum)
Symptoms consistent with rotator cuff pathology. Perhaps aggravated by fall.  Discussed trial of gabapentin for such as nerve pain. Patient declined MRI of cervical spine, left shoulder at this time she would like to have ablation first. Advised over-the-counter topical remedies including Aspercreme, heat, ice.

## 2016-12-06 ENCOUNTER — Encounter: Payer: Self-pay | Admitting: Family

## 2016-12-12 ENCOUNTER — Encounter: Payer: Self-pay | Admitting: Family

## 2016-12-13 ENCOUNTER — Other Ambulatory Visit: Payer: Self-pay | Admitting: Cardiovascular Disease

## 2016-12-13 ENCOUNTER — Encounter: Payer: Self-pay | Admitting: Podiatry

## 2016-12-13 ENCOUNTER — Telehealth: Payer: Self-pay | Admitting: Cardiovascular Disease

## 2016-12-13 ENCOUNTER — Encounter: Payer: Self-pay | Admitting: Family

## 2016-12-13 NOTE — Telephone Encounter (Signed)
Spoke w/ pt.  She reports that she was advised to have a sleep study prior to her DCCV. However, she has not heard back from her PCP's office regarding this. She is sched for DCCV tomorrow am & would like to know if she should proceed or resched after SS. Advised her that I will make Dr. [name] aware of her concerns and call her back w/ his recommendation.

## 2016-12-13 NOTE — Telephone Encounter (Signed)
Pt calling about her Cardioversion in the morning Has some questions  Please advise

## 2016-12-13 NOTE — Telephone Encounter (Signed)
[name],  Any updates on sleep apnea test?  Feel terrible for patient having to put off procedure

## 2016-12-13 NOTE — Telephone Encounter (Signed)
Spoke w/ pt.  Advised her that if she proceeds w/ DCCV tomorrow, it may not hold and that Dr. [name] recommends that she wait until after her sleep study.  She talked out the pros and cons of both options and has decided that she would like to proceed w/ DCCV tomorrow, as she is concerned that it will take awhile to have sleep study and if she does have OSA, to potentially get CPAP machine. She states that she feels so bad that she would be grateful to be in NSR for a week and keep being cardioverted monthly if she needs to.  Dr. [name] notified that pt will proceed as scheduled.

## 2016-12-14 ENCOUNTER — Encounter
Admission: RE | Disposition: A | Discharge status: Home / Self Care | Payer: Self-pay | Source: Ambulatory Visit | Attending: Cardiovascular Disease

## 2016-12-14 ENCOUNTER — Encounter: Payer: Self-pay | Admitting: Certified Registered"

## 2016-12-14 ENCOUNTER — Ambulatory Visit
Admission: RE | Admit: 2016-12-14 | Discharge: 2016-12-14 | Disposition: A | Discharge status: Home / Self Care | Payer: Medicare Other | Source: Ambulatory Visit | Attending: Cardiovascular Disease | Admitting: Cardiovascular Disease

## 2016-12-14 ENCOUNTER — Telehealth: Payer: Self-pay | Admitting: *Deleted

## 2016-12-14 DIAGNOSIS — M8580 Other specified disorders of bone density and structure, unspecified site: Secondary | ICD-10-CM | POA: Diagnosis not present

## 2016-12-14 DIAGNOSIS — Z91040 Latex allergy status: Secondary | ICD-10-CM | POA: Diagnosis not present

## 2016-12-14 DIAGNOSIS — K219 Gastro-esophageal reflux disease without esophagitis: Secondary | ICD-10-CM | POA: Insufficient documentation

## 2016-12-14 DIAGNOSIS — Z0389 Encounter for observation for other suspected diseases and conditions ruled out: Secondary | ICD-10-CM | POA: Diagnosis present

## 2016-12-14 DIAGNOSIS — F419 Anxiety disorder, unspecified: Secondary | ICD-10-CM | POA: Diagnosis not present

## 2016-12-14 DIAGNOSIS — I10 Essential (primary) hypertension: Secondary | ICD-10-CM | POA: Diagnosis not present

## 2016-12-14 DIAGNOSIS — Z803 Family history of malignant neoplasm of breast: Secondary | ICD-10-CM | POA: Diagnosis not present

## 2016-12-14 DIAGNOSIS — Z806 Family history of leukemia: Secondary | ICD-10-CM | POA: Insufficient documentation

## 2016-12-14 DIAGNOSIS — Z8249 Family history of ischemic heart disease and other diseases of the circulatory system: Secondary | ICD-10-CM | POA: Diagnosis not present

## 2016-12-14 DIAGNOSIS — Z539 Procedure and treatment not carried out, unspecified reason: Secondary | ICD-10-CM | POA: Insufficient documentation

## 2016-12-14 DIAGNOSIS — M1712 Unilateral primary osteoarthritis, left knee: Secondary | ICD-10-CM | POA: Insufficient documentation

## 2016-12-14 DIAGNOSIS — Z8041 Family history of malignant neoplasm of ovary: Secondary | ICD-10-CM | POA: Diagnosis not present

## 2016-12-14 DIAGNOSIS — Z885 Allergy status to narcotic agent status: Secondary | ICD-10-CM | POA: Diagnosis not present

## 2016-12-14 DIAGNOSIS — J449 Chronic obstructive pulmonary disease, unspecified: Secondary | ICD-10-CM | POA: Diagnosis not present

## 2016-12-14 DIAGNOSIS — N6019 Diffuse cystic mastopathy of unspecified breast: Secondary | ICD-10-CM | POA: Insufficient documentation

## 2016-12-14 DIAGNOSIS — Z91048 Other nonmedicinal substance allergy status: Secondary | ICD-10-CM | POA: Diagnosis not present

## 2016-12-14 DIAGNOSIS — F329 Major depressive disorder, single episode, unspecified: Secondary | ICD-10-CM | POA: Insufficient documentation

## 2016-12-14 DIAGNOSIS — Z90710 Acquired absence of both cervix and uterus: Secondary | ICD-10-CM | POA: Insufficient documentation

## 2016-12-14 DIAGNOSIS — M545 Low back pain: Secondary | ICD-10-CM | POA: Diagnosis not present

## 2016-12-14 DIAGNOSIS — E559 Vitamin D deficiency, unspecified: Secondary | ICD-10-CM | POA: Diagnosis not present

## 2016-12-14 DIAGNOSIS — M1990 Unspecified osteoarthritis, unspecified site: Secondary | ICD-10-CM | POA: Diagnosis not present

## 2016-12-14 DIAGNOSIS — E785 Hyperlipidemia, unspecified: Secondary | ICD-10-CM | POA: Diagnosis not present

## 2016-12-14 DIAGNOSIS — G4700 Insomnia, unspecified: Secondary | ICD-10-CM | POA: Diagnosis not present

## 2016-12-14 DIAGNOSIS — Z79899 Other long term (current) drug therapy: Secondary | ICD-10-CM | POA: Insufficient documentation

## 2016-12-14 DIAGNOSIS — I4891 Unspecified atrial fibrillation: Secondary | ICD-10-CM | POA: Diagnosis not present

## 2016-12-14 DIAGNOSIS — Z87891 Personal history of nicotine dependence: Secondary | ICD-10-CM | POA: Insufficient documentation

## 2016-12-14 DIAGNOSIS — Z800 Family history of malignant neoplasm of digestive organs: Secondary | ICD-10-CM | POA: Diagnosis not present

## 2016-12-14 HISTORY — PX: CARDIOVERSION: EP1203

## 2016-12-14 SURGERY — CARDIOVERSION (CATH LAB)
Anesthesia: General

## 2016-12-14 NOTE — Telephone Encounter (Signed)
Amber Barnes, I have tried multiple times to get someone to call me back from [place]. I have left another voice mail for them this morning. If I haven't heard back from them in the next couple hours I will try again.

## 2016-12-15 ENCOUNTER — Encounter: Payer: Self-pay | Admitting: Cardiovascular Disease

## 2016-12-16 NOTE — Telephone Encounter (Signed)
"  I'd like to schedule my surgery."  Do you have a date in mind?  "I'd like to do it on July 11."

## 2016-12-17 NOTE — Progress Notes (Signed)
Cardiology Office Note  Date:  12/20/2016   ID:  Amber Barnes, DOB May 21, 1951, MRN [id number]  PCP:  [name], FNP   Chief Complaint  Patient presents with  . OTHER    Pt has not had cardioversion no complaints today. Meds reviewed verbally with pt.    HPI:  [name] is a 66 year-old woman with history of  Persistent atrial fibrillation obesity,  arthritis,  hyperlipidemia,  hypertension,  Stress test January 2018 showing no ischemia, Ejection fraction 55-65% who presents for follow-up of her hyperlipidemia, hypertension, persistent atrial fibrillation  cardioversion procedure 09/07/2016 Normal sinus rhythm restored at that time Back into atrial fibrillation on last hospital visit March 13 Harder to walk,  SOB with exertion Weight up 10 pounds since 04/2016  Flecainide held, on amiodarone Repeat cardioversion 10/19/2016 Normal sinus rhythm restored  Recurrent atrial fib Seen by Dr. [name], Started on ranexa and continued on amiodarone She converted to normal sinus rhythm on her own, did not need DCCV  In follow-up today she is holding normal sinus rhythm Feels well no complaints Scheduled for ankle surgery in 1 month July 11  Takes lasix 1 to 2 a day amio BID  some fatigue  EKG personally reviewed by myself today showing  NSR rate 54  past medical history reviewed  In the hospital for foot surgery, this was delayed as she was found to be in atrial fibrillation Admitted to the hospital 07/27/2016 Started on diltiazem, beta blocker, anticoagulation  Back in hospsital 08/10/2016 Hospital records reviewed She was very anxious about palpitations Developed chest discomfort, tightness Medication doses were increased for better rate control Lasix in the emergency room for shortness of breath Also given DuoNeb's  Previous problems with sleep, Bad sleep, dreams  Total cholesterol 156, LDL 74, creatinine 0.69hematocrit 39  She did not tolerate  Lipitor and cholesterol climbed up to 285, LDL 196 On Lipitor total cholesterol 168, LDL 88 in October 2015 No recent lipid panel available on Crestor She is scheduled to have repeat lab work at the end of May 2016 with primary care  She reports having a prior smoking history for 5 years, then smoked only small amounts  PMH:   has a past medical history of Achilles tendinitis; Acute medial meniscal tear; Allergy; Anxiety; Arthritis; Atrial fibrillation ([place]); Chronic lower back pain; COPD (chronic obstructive pulmonary disease) ([place]); Depression; Fibrocystic breast disease; GERD (gastroesophageal reflux disease); Hyperlipidemia; Hypertension; Insomnia; Lipoma; Osteoarthritis; Osteopenia; Tendonitis; Thyroid disease; and Vitamin D deficiency.  PSH:    Past Surgical History:  Procedure Laterality Date  . ABDOMINAL HYSTERECTOMY     ovaries left  . ACHILLES TENDON SURGERY    . BACK SURGERY    . CARDIOVERSION N/A 09/07/2016   Procedure: CARDIOVERSION;  Surgeon: Minna Merritts, MD;  Location: [facility] ORS;  Service: Cardiovascular;  Laterality: N/A;  . CARDIOVERSION N/A 10/19/2016   Procedure: CARDIOVERSION;  Surgeon: Minna Merritts, MD;  Location: [facility] ORS;  Service: Cardiovascular;  Laterality: N/A;  . CARDIOVERSION N/A 12/14/2016   Procedure: CARDIOVERSION;  Surgeon: Minna Merritts, MD;  Location: [facility] ORS;  Service: Cardiovascular;  Laterality: N/A;  . COLONOSCOPY    . ESOPHAGOGASTRODUODENOSCOPY    . THYROIDECTOMY     thyroid lobectomy secondary to a benign nodule    Current Outpatient Prescriptions  Medication Sig Dispense Refill  . amiodarone (PACERONE) 200 MG tablet Take 1 tablet (200 mg total) by mouth 2 (two) times daily. 70 tablet 6  . apixaban (ELIQUIS)  5 MG TABS tablet Take 1 tablet (5 mg total) by mouth 2 (two) times daily. 60 tablet 3  . B Complex Vitamins (VITAMIN B COMPLEX PO) Take 1 tablet by mouth daily.     . bisoprolol (ZEBETA) 10 MG tablet Take 1 tablet (10 mg total)  by mouth 2 (two) times daily. 60 tablet 3  . busPIRone (BUSPAR) 10 MG tablet Take 1 tablet (10 mg total) by mouth 2 (two) times daily. 90 tablet 1  . calcium gluconate 500 MG tablet Take 500 tablets by mouth daily.    . Cholecalciferol (VITAMIN D3) 5000 units CAPS Take 5,000 Units by mouth daily.    . DULoxetine (CYMBALTA) 60 MG capsule Take 2 capsules (120 mg total) by mouth daily. 180 capsule 2  . esomeprazole (NEXIUM) 20 MG capsule Take 20 mg by mouth daily as needed (acid reflux).    . furosemide (LASIX) 20 MG tablet Take 1 tablet (20 mg total) by mouth 2 (two) times daily. (Patient taking differently: Take 20 mg by mouth 2 (two) times daily. May take an additional 20 mgs as needed for swelling) 180 tablet 3  . gabapentin (NEURONTIN) 100 MG capsule Take 2 capsules (200 mg total) by mouth at bedtime. 90 capsule 1  . Multiple Vitamin (MULTIVITAMIN) tablet Take 1 tablet by mouth daily.    Marland Kitchen oxymetazoline (AFRIN) 0.05 % nasal spray Place 1 spray into both nostrils at bedtime as needed for congestion.     . potassium chloride (KLOR-CON 10) 10 MEQ tablet Take 1 tablet (10 mEq total) by mouth 2 (two) times daily. (Patient taking differently: Take 10 mEq by mouth 2 (two) times daily. May take an additional 10 meq (with lasix) as needed for swelling) 60 tablet 6  . ranolazine (RANEXA) 500 MG 12 hr tablet Take 1 tablet (500 mg total) by mouth 2 (two) times daily. 60 tablet 2  . rosuvastatin (CRESTOR) 20 MG tablet Take 1 tablet (20 mg total) by mouth daily. 90 tablet 1  . sennosides-docusate sodium (SENOKOT-S) 8.6-50 MG tablet Take 1 tablet by mouth at bedtime as needed for constipation.      No current facility-administered medications for this visit.      Allergies:   Prednisone; Hydrocodone-acetaminophen; Adhesive [tape]; and Latex   Social History:  The patient  reports that she has quit smoking. Her smoking use included Cigarettes. She has a 2.50 pack-year smoking history. She quit smokeless  tobacco use about 15 years ago. She reports that she drinks alcohol. She reports that she does not use drugs.   Family History:   family history includes Breast cancer in her paternal aunt; Cancer in her mother; Cancer (age of onset: 36) in her sister; Cancer (age of onset: 46) in her father; Heart attack (age of onset: 15) in her father; Heart disease in her father; Hyperlipidemia in her father; Hypertension in her father.    Review of Systems: Review of Systems  Constitutional: Negative.   Respiratory: Negative.   Cardiovascular: Negative.   Gastrointestinal: Negative.   Musculoskeletal: Negative.   Psychiatric/Behavioral: The patient does not have insomnia ( none).   All other systems reviewed and are negative.    PHYSICAL EXAM: VS:  BP 118/70 (BP Location: Left Arm, Patient Position: Sitting, Cuff Size: Large)   Pulse (!) 54   Ht 5\' 5"  (1.651 m)   Wt (!) 301 lb 8 oz (136.8 kg)   BMI 50.17 kg/m  , BMI Body mass index is 50.17 kg/m. GEN: Well  nourished, well developed, in no acute distress , Morbid obese  HEENT: normal  Neck: no JVD, carotid bruits, or masses Cardiac:RRR,  no murmurs, rubs, or gallops,no edema  Respiratory:  clear to auscultation bilaterally, normal work of breathing GI: soft, nontender, nondistended, + BS MS: no deformity or atrophy  Skin: warm and dry, no rash Neuro:  Strength and sensation are intact Psych: euthymic mood, full affect     Recent Labs: 07/28/2016: TSH 2.373 08/10/2016: B Natriuretic Peptide 278.0 11/27/2016: ALT 25 11/28/2016: Hemoglobin 13.3; Platelets 183 11/29/2016: BUN 17; Creatinine, Ser 0.76; Magnesium 2.1; Potassium 4.0; Sodium 141    Lipid Panel Lab Results  Component Value Date   CHOL 126 08/11/2016   HDL 45 08/11/2016   LDLCALC 65 08/11/2016   TRIG 82 08/11/2016      Wt Readings from Last 3 Encounters:  12/20/16 (!) 301 lb 8 oz (136.8 kg)  12/14/16 299 lb (135.6 kg)  12/01/16 299 lb 8 oz (135.9 kg)        ASSESSMENT AND PLAN:  Preop cardiovascular Recommended she stop the anticoagulation 2 days prior to her ankle surgery in July 2018  No medication changes  Mixed hyperlipidemia Cholesterol is at goal on the current lipid regimen. No changes to the medications were made.  Essential hypertension Blood pressure is well controlled on today's visit. No changes made to the medications.  Atrial fibrillation with RVR ([place]) She will stay on the same medications, Recommended she decrease the amiodarone down to once a day after her ankle surgery Stay on ranexa  SOB (shortness of breath) Improved shortness of breath with restoring normal sinus rhythm   Morbid obesity ([place]) Despite her efforts, she is unable to lose weight Recommended a strict low carbohydrate diet  Acute pulmonary edema ([place]) She is taking Lasix daily, sometimes twice a day  Suspect she may be able to wean off the Lasix now that she is back in normal sinus rhythm   Total encounter time more than 15 minutes  Greater than 50% was spent in counseling and coordination of care with the patient   Disposition:   F/U  6 months   Orders Placed This Encounter  Procedures  . EKG 12-Lead     Signed, Esmond Plants, M.D., Ph.D. 12/20/2016  [place], [place]

## 2016-12-19 ENCOUNTER — Telehealth: Payer: Self-pay | Admitting: Family

## 2016-12-19 NOTE — Telephone Encounter (Signed)
[name],  Did we get sleep study scheduled yet?  Thanks so so much!

## 2016-12-19 NOTE — Telephone Encounter (Signed)
I spoke to Amber Barnes @ [facility] who is the manager on Friday and gave him all of her information and told him all the issues that I/we are having with them. He is looking into her case to see what has happened. Hopefully he has this taken care of because I refaxed everything over to him regarding her.

## 2016-12-20 ENCOUNTER — Ambulatory Visit (INDEPENDENT_AMBULATORY_CARE_PROVIDER_SITE_OTHER): Payer: Medicare Other | Admitting: Cardiovascular Disease

## 2016-12-20 ENCOUNTER — Encounter: Payer: Self-pay | Admitting: Cardiovascular Disease

## 2016-12-20 VITALS — BP 118/70 | HR 54 | Ht 65.0 in | Wt 301.5 lb

## 2016-12-20 DIAGNOSIS — E782 Mixed hyperlipidemia: Secondary | ICD-10-CM | POA: Diagnosis not present

## 2016-12-20 DIAGNOSIS — I481 Persistent atrial fibrillation: Secondary | ICD-10-CM

## 2016-12-20 DIAGNOSIS — R0602 Shortness of breath: Secondary | ICD-10-CM

## 2016-12-20 DIAGNOSIS — I10 Essential (primary) hypertension: Secondary | ICD-10-CM

## 2016-12-20 DIAGNOSIS — J810 Acute pulmonary edema: Secondary | ICD-10-CM | POA: Diagnosis not present

## 2016-12-20 DIAGNOSIS — I4819 Other persistent atrial fibrillation: Secondary | ICD-10-CM

## 2016-12-20 NOTE — Telephone Encounter (Signed)
Patient has yet to be contacted.

## 2016-12-20 NOTE — Telephone Encounter (Signed)
Thank you  Amber Barnes, would you call pt to ensure she has been contacted?

## 2016-12-20 NOTE — Patient Instructions (Addendum)
Medication Instructions:   No medication changes made  We will wean down on the amiodraone to one a day after your ankle surgery Stay on ranexa  Labwork:  No new labs needed  Testing/Procedures:  No further testing at this time   I recommend watching educational videos on topics of interest to you at:       www.goemmi.com  Enter code: [email or web address]    Follow-Up: It was a pleasure seeing you in the office today. Please call us if you have new issues that need to be addressed before your next appt.  [phone]  Your physician wants you to follow-up in: 6 months.  You will receive a reminder letter in the mail two months in advance. If you don't receive a letter, please call our office to schedule the follow-up appointment.  If you need a refill on your cardiac medications before your next appointment, please call your pharmacy.

## 2016-12-21 NOTE — Telephone Encounter (Signed)
I'm on it! For future home sleep studies I will find someone else that does these. I agree it is ridiculous and have expressed that to O[facility] at [facility].

## 2016-12-21 NOTE — Telephone Encounter (Signed)
[name],  Can she just do a regular ( not at home) sleep study?  This is ridiculous and I do not want to use [facility] anymore.

## 2016-12-26 NOTE — Telephone Encounter (Signed)
All good here now?

## 2016-12-29 NOTE — Telephone Encounter (Signed)
They brought additional forms over yesterday.You signed the form today and Amber Barnes has faxed it. It should be all taken care of now.

## 2016-12-30 ENCOUNTER — Ambulatory Visit: Payer: Medicare Other | Admitting: Podiatry

## 2017-01-02 NOTE — Telephone Encounter (Signed)
James from [place] dropped off forms to be filled out for CPAP equipment.

## 2017-01-05 ENCOUNTER — Encounter: Payer: Self-pay | Admitting: Podiatry

## 2017-01-05 ENCOUNTER — Ambulatory Visit (INDEPENDENT_AMBULATORY_CARE_PROVIDER_SITE_OTHER): Payer: Medicare Other | Admitting: Podiatry

## 2017-01-05 DIAGNOSIS — M216X2 Other acquired deformities of left foot: Secondary | ICD-10-CM

## 2017-01-05 DIAGNOSIS — M7660 Achilles tendinitis, unspecified leg: Secondary | ICD-10-CM | POA: Diagnosis not present

## 2017-01-05 DIAGNOSIS — M7732 Calcaneal spur, left foot: Secondary | ICD-10-CM

## 2017-01-05 NOTE — Patient Instructions (Signed)

## 2017-01-06 NOTE — Progress Notes (Signed)
Subjective: Amber Barnes presents to the office today for continued pain to the back of her left heel along prominent heel spurs as well as along the Achilles tendon. She's been cleared by her cardiologist and should to pursue a surgical neck Scott below weeks. She states that she still gets exquisite pain to the back of the left heel and this is impacting her quality of life. We've attempted numerous conservative treatments and she previously was scheduled for surgery however unfortunately she went into A. fib and this was found preoperatively so surgery was canceled. At that she is cleared said to go ahead and proceed with surgery. Denies any systemic complaints such as fevers, chills, nausea, vomiting. No acute changes since last appointment, and no other complaints at this time.   Objective: AAO x3, NAD DP/PT pulses palpable bilaterally, CRT less than 3 seconds On the posterior aspect of the left heel is a prominent retrocalcaneal exostosis with prominence both medial and laterally. There is tenderness mostly on the posterior medial portion of the heel along the prominent heel spur. There is mild erythema from shoe gear irritation. There is minimal edema to this area. Equinus is present. There is tenderness to palpation on the insertion of the Achilles tendon into the calcaneus but there is no pain along the mid substance of the Achilles tendon. There is no pain with lateral compression of the calcaneus. There is no other areas of pinpoint bony tenderness bilaterally. Overall, the exam is unchanged.  No open lesions or pre-ulcerative lesions.  No pain with calf compression, swelling, warmth, erythema  Assessment: Left retrocalcaneal exostosis/insertional Achilles tendinitis  Plan: -All treatment options discussed with the patient including all alternatives, risks, complications.  -At this time again discussed both conservative and surgical treatment options with the patient. At this time she wishes  to proceed with surgical intervention. -Will proceed with gastrocnemius recession, posterior heel removal as well as tenolysis of the Achilles tendon and cast application. She is scheduled for 01/18/2017. Discussed to stop her blood thinners 2 days prior to surgery. From my standpoint she can start that next day after surgery. -The incision placement as well as the postoperative course was discussed with the patient. I discussed risks of the surgery which include, but not limited to, infection, bleeding, pain, swelling, need for further surgery, delayed or nonhealing, painful or ugly scar, numbness or sensation changes, over/under correction, recurrence, transfer lesions, further deformity, hardware failure, DVT/PE, loss of toe/foot. Patient understands these risks and wishes to proceed with surgery. The surgical consent was reviewed with the patient all 3 pages were signed. No promises or guarantees were given to the outcome of the procedure. All questions were answered to the best of my ability. Before the surgery the patient was encouraged to call the office if there is any further questions. The surgery will be performed at the [facility] on an outpatient basis.  Celesta Gentile, DPM

## 2017-01-09 ENCOUNTER — Telehealth: Payer: Self-pay | Admitting: *Deleted

## 2017-01-09 ENCOUNTER — Telehealth: Payer: Self-pay | Admitting: Family

## 2017-01-09 DIAGNOSIS — G4733 Obstructive sleep apnea (adult) (pediatric): Secondary | ICD-10-CM | POA: Insufficient documentation

## 2017-01-09 NOTE — Telephone Encounter (Signed)
Patient has requested sleep study results Pt contact [phone]

## 2017-01-09 NOTE — Telephone Encounter (Signed)
See separate telephone note, thanks

## 2017-01-09 NOTE — Telephone Encounter (Signed)
Call pt  Sleep study shows likely positive for OSA  Have placed a referral to cipap titration and equipment

## 2017-01-10 NOTE — Telephone Encounter (Signed)
Patient has been informed. Also Patient stated she has already ordered machine and its currently being shipped to her house.

## 2017-01-12 ENCOUNTER — Other Ambulatory Visit: Payer: Self-pay | Admitting: Cardiovascular Disease

## 2017-01-18 ENCOUNTER — Encounter: Payer: Self-pay | Admitting: Podiatry

## 2017-01-18 DIAGNOSIS — M216X2 Other acquired deformities of left foot: Secondary | ICD-10-CM | POA: Diagnosis not present

## 2017-01-18 DIAGNOSIS — M7662 Achilles tendinitis, left leg: Secondary | ICD-10-CM | POA: Diagnosis not present

## 2017-01-18 DIAGNOSIS — M7732 Calcaneal spur, left foot: Secondary | ICD-10-CM | POA: Diagnosis not present

## 2017-01-19 ENCOUNTER — Telehealth: Payer: Self-pay | Admitting: Podiatry

## 2017-01-19 NOTE — Telephone Encounter (Signed)
I had surgery yesterday and I've had a headache ever since. The pain medication and Ibuprofen have not helped. Could someone please call me with any advice. Thank you.

## 2017-01-19 NOTE — Telephone Encounter (Signed)
She is on percocet and not vicodin. She said she had been on percocet before. If her headache is any worse or not getting better she should go to the ER. After speaking with Val she has no other issues other than a headache.

## 2017-01-19 NOTE — Telephone Encounter (Addendum)
I spoke with pt, she states she still have the headache, doesn't know BP and has no other symptoms. Pt states she is taking the hydrocodone, and I asked about the reaction she had to the hydrocodone previously and she stated she felt that was due to also being on the steroid. I forwarded her statement to Dr. Jacqualyn Posey. Dr. Jacqualyn Posey states 1/2 the Percocet 10mg  and take as directed, should have started Eliquis this morning so no more Ibuprofen and have pt call with any concerns. I informed pt of Dr. Leigh Aurora orders. Pt states she took her BP and it is 128/ 58, and started the Eliquis this morning, and understands the instructions and will call with problems. I informed Dr. Jacqualyn Posey of pt's BP, he stated that was fine.

## 2017-01-19 NOTE — Telephone Encounter (Signed)
Can you see how her blood pressure is. Is she having any double vision or any other issues?

## 2017-01-20 ENCOUNTER — Telehealth: Payer: Self-pay | Admitting: *Deleted

## 2017-01-20 NOTE — Telephone Encounter (Signed)
Called and spoke with patient about how she was doing after her surgery on Wednesday and patient states that she is doing much better and the headaches are better as well and that someone had called and stated that they were working on getting patient a scooter and was to call her back to let her know the status. I stated to patient I would check into it and call patient back. Lattie Haw

## 2017-01-23 ENCOUNTER — Ambulatory Visit (INDEPENDENT_AMBULATORY_CARE_PROVIDER_SITE_OTHER): Payer: Medicare Other | Admitting: Podiatry

## 2017-01-23 ENCOUNTER — Ambulatory Visit (INDEPENDENT_AMBULATORY_CARE_PROVIDER_SITE_OTHER): Payer: Medicare Other

## 2017-01-23 ENCOUNTER — Encounter: Payer: Self-pay | Admitting: Podiatry

## 2017-01-23 VITALS — BP 163/79 | HR 47 | Temp 99.0°F | Resp 18

## 2017-01-23 DIAGNOSIS — M7660 Achilles tendinitis, unspecified leg: Secondary | ICD-10-CM

## 2017-01-23 DIAGNOSIS — M7732 Calcaneal spur, left foot: Secondary | ICD-10-CM

## 2017-01-23 DIAGNOSIS — Z9889 Other specified postprocedural states: Secondary | ICD-10-CM

## 2017-01-23 MED ORDER — HYDROMORPHONE HCL 2 MG PO TABS: 2.0000 mg | ORAL_TABLET | Freq: Four times a day (QID) | ORAL | 0 refills | Status: DC | PRN

## 2017-01-23 NOTE — Progress Notes (Signed)
Subjective: Amber Barnes is a 66 y.o. is seen today in office s/p left heel spur removal, Achilles tendon lysis, gastrocnemius recession preformed on 01/18/2017. She presents today for POV #1. She states that she is taking the Percocet for pain. She states that helps intermittently but she is asking see if there is any other medication. She states that she has not had swelling however today with the first that she left the hospital in the car on the drive over she started some swelling as that was hanging down but otherwise she has been doing well. She is currently taking her liquids. Denies any systemic complaints such as fevers, chills, nausea, vomiting. No calf pain, chest pain, shortness of breath.   Objective: General: No acute distress, AAOx3  DP/PT pulses palpable 2/4, CRT < 3 sec to all digits.  Protective sensation intact. Motor function intact.  Left foot: Incision is well coapted without any evidence of dehiscence and sutures/staples are intact. There is some faint erythema directly along the incision distally along with a heel spur used to be how this initially more bruising type appearance. This is very localized and there is no ascending cellulitis. There is no drainage or pus expressed. There is mild swelling to the surgical site mild tenderness to palpation. There is no other area tenderness identified. No other open lesions or pre-ulcerative lesions.  No pain with calf compression, swelling, warmth, erythema.   Assessment and Plan:  Status post left foot surgery, doing well with no complications   -Treatment options discussed including all alternatives, risks, and complications -X-rays were obtained and reviewed. Status post posterior heel spur resection. No evidence of acute fracture identified today. -Antibiotic on it was applied to the incision followed by dry sterile dressing. Keep dressing clean, dry, intact. -Finish course of antibiotics. -Ice/elevation -Pain medication  as needed. Prescribed allotted. Hopefully after this prescription we can go back to Percocet. -At that place her into a cam boot today. There was some faint erythema to the heel and this could've been where the cast was rubbing. Also I put into the boot in case there was any issues that she did take the bandage off to look of the foot if we needed to however do not want her to minimus is an issue and she calls [place] first. -Nonweightbearing at all times. Most wear cam boot all the time even while sleeping. -Monitor for any clinical signs or symptoms of infection and DVT/PE and directed to call the office immediately should any occur or go to the ER. -Follow-up in 1 week or sooner if any problems arise. In the meantime, encouraged to call the office with any questions, concerns, change in symptoms.   Celesta Gentile, DPM

## 2017-01-27 ENCOUNTER — Telehealth: Payer: Self-pay | Admitting: *Deleted

## 2017-01-27 ENCOUNTER — Other Ambulatory Visit: Payer: Self-pay | Admitting: Family

## 2017-01-27 NOTE — Telephone Encounter (Signed)
Called patient and patient stated that she was doing ok and there was no fever or chills and was elevating and that she would see [place] next week and I stated that if she has any concerns to call the [place] office. Amber Barnes

## 2017-01-30 ENCOUNTER — Encounter: Payer: Medicare Other | Admitting: Podiatry

## 2017-01-31 NOTE — Telephone Encounter (Signed)
Pharmacy called requesting this refill for pt. Please advise, thank you!  Pharmacy - [facility] [place], [place]

## 2017-02-02 ENCOUNTER — Ambulatory Visit (INDEPENDENT_AMBULATORY_CARE_PROVIDER_SITE_OTHER): Payer: Medicare Other

## 2017-02-02 ENCOUNTER — Ambulatory Visit (INDEPENDENT_AMBULATORY_CARE_PROVIDER_SITE_OTHER): Payer: Self-pay | Admitting: Podiatry

## 2017-02-02 DIAGNOSIS — S99922A Unspecified injury of left foot, initial encounter: Secondary | ICD-10-CM | POA: Diagnosis not present

## 2017-02-02 DIAGNOSIS — M216X2 Other acquired deformities of left foot: Secondary | ICD-10-CM

## 2017-02-02 DIAGNOSIS — Z9889 Other specified postprocedural states: Secondary | ICD-10-CM

## 2017-02-02 DIAGNOSIS — M7732 Calcaneal spur, left foot: Secondary | ICD-10-CM

## 2017-02-03 ENCOUNTER — Telehealth: Payer: Self-pay | Admitting: Podiatry

## 2017-02-03 MED ORDER — OXYCODONE-ACETAMINOPHEN 10-325 MG PO TABS: ORAL_TABLET | ORAL | 0 refills | Status: DC

## 2017-02-03 NOTE — Telephone Encounter (Signed)
Can you please refill Percocet 10/325 1 tab PO q4-6 hours prn pain. She left this week before she could get it. Please have them stop by the [facility] office if that is closer for them to pick up. Thank you.

## 2017-02-03 NOTE — Progress Notes (Signed)
Subjective: Amber Barnes is a 66 y.o. is seen today in office s/p left heel spur removal, Achilles tendon lysis, gastrocnemius recession preformed on 01/18/2017. She presents today for POV #2. She does state that about 5 days ago she fell getting into bed. When she fell she was not wearing the boot. She states that prior to this her pain was much improved and she was going a couple days without taking any pain medication and medication take 1 if needed. Since the fall she's had some increase in pain but is slowly getting better. She states that she is also been trying to move her ankle a little bit of possible. She states that she didn't change the bandage after she fell to make sure that there was no bleeding and there was not she states the incision was healing well.  Denies any systemic complaints such as fevers, chills, nausea, vomiting. No calf pain, chest pain, shortness of breath.   Objective: General: No acute distress, AAOx3  DP/PT pulses palpable 2/4, CRT < 3 sec to all digits.  Protective sensation intact. Motor function intact.  Left foot: Incision is well coapted without any evidence of dehiscence and sutures/staples are intact. There is no swelling erythema, ascending cellulitis. There is no fluctuance, crepitus, malodor and there is no clinical signs of infection. The incision appears to be healing well. There is a dried area adjacent to the incision from under was a blister at this scab over. Thompson test is negative and Achilles tendon appears to be intact. She is able to dorsiflex and plantar flex against resistance and strength appears to be intact. There is no pain with calf compression, erythema, warmth. There is no open sores identified. Mild swelling to the surgical site but improved.  Assessment and Plan:  Status post left foot surgery, doing well with no complications   -Treatment options discussed including all alternatives, risks, and complications -X-rays were obtained  and reviewed. Status post posterior heel spur resection. No evidence of acute fracture identified today. -And about equinus was applied over the incisions followed by dressing. Also Ace wrap was also applied to help with swelling. -Continue nonweightbearing at all times and wear cam boot at all times.  -Pain medication as needed. -Continue ice and elevation.  -Continue passive range of motion exercises the ankle.  -Monitor for any clinical signs or symptoms of infection and DVT/PE and directed to call the office immediately should any occur or go to the ER. -Follow-up in 1 week or sooner if any problems arise. In the meantime, encouraged to call the office with any questions, concerns, change in symptoms.   Celesta Gentile, DPM

## 2017-02-03 NOTE — Telephone Encounter (Signed)
I saw Dr. Jacqualyn Posey yesterday and he was supposed to send an Rx to my pharmacy. I called them yesterday afternoon and they said they never received it. I was just calling to see if it had been sent today.

## 2017-02-03 NOTE — Addendum Note (Signed)
Addended by: Harriett Sine D on: 02/03/2017 02:47 PM   Modules accepted: Orders

## 2017-02-03 NOTE — Telephone Encounter (Signed)
Pt needed refill on pain medication, Pharmacy [facility] on [address] In [place].

## 2017-02-03 NOTE — Telephone Encounter (Signed)
I informed pt and she states she will have her husband pick up in the [place] today or Monday.

## 2017-02-06 ENCOUNTER — Telehealth: Payer: Self-pay | Admitting: Podiatry

## 2017-02-06 MED ORDER — HYDROMORPHONE HCL 2 MG PO TABS: 2.0000 mg | ORAL_TABLET | Freq: Four times a day (QID) | ORAL | 0 refills | Status: DC | PRN

## 2017-02-06 NOTE — Telephone Encounter (Signed)
I spoke with Amber Barnes and she states Dr. Jacqualyn Posey wrote for the dilaudid, because the oxycodone did not help her pain and had caused itching.

## 2017-02-06 NOTE — Telephone Encounter (Signed)
I called Friday and left a message that I did not get my pain medication during my visit on Thursday. My husband came by Friday and got the Rx. Well, the wrong Rx was written. It was written for hydrocodone and Dr. Jacqualyn Posey gave me dilaudid because the hydrocodone was not working. He said he can bring this Rx back and get the new one sometime today. Please call me with any questions.

## 2017-02-13 ENCOUNTER — Encounter: Payer: Self-pay | Admitting: Podiatry

## 2017-02-13 ENCOUNTER — Ambulatory Visit (INDEPENDENT_AMBULATORY_CARE_PROVIDER_SITE_OTHER): Payer: Medicare Other | Admitting: Podiatry

## 2017-02-13 ENCOUNTER — Ambulatory Visit (INDEPENDENT_AMBULATORY_CARE_PROVIDER_SITE_OTHER): Payer: Medicare Other

## 2017-02-13 VITALS — BP 122/60 | HR 56

## 2017-02-13 DIAGNOSIS — M216X2 Other acquired deformities of left foot: Secondary | ICD-10-CM | POA: Diagnosis not present

## 2017-02-13 DIAGNOSIS — Z9889 Other specified postprocedural states: Secondary | ICD-10-CM | POA: Diagnosis not present

## 2017-02-13 DIAGNOSIS — M7732 Calcaneal spur, left foot: Secondary | ICD-10-CM

## 2017-02-15 NOTE — Progress Notes (Signed)
Subjective: Amber Barnes is a 66 y.o. is seen today in office s/p left heel spur removal, Achilles tendon lysis, gastrocnemius recession preformed on 01/18/2017. She presents today for POV #3. She states that she has been feeling better and not taking as much pain medication. Her husband states that she has been up and using her knee scooter more without issue and he can tell she if feeling better. She has remained NWB and denies any further injury or fall since last appointment.  Denies any systemic complaints such as fevers, chills, nausea, vomiting. No calf pain, chest pain, shortness of breath.   Objective: General: No acute distress, AAOx3  DP/PT pulses palpable 2/4, CRT < 3 sec to all digits.  Protective sensation intact. Motor function intact.  Left foot: Incision is well coapted without any evidence of dehiscence and sutures/staples are intact and the incision appears to be doing well. There is no surrounding erythema, ascending cellulitis. There is no fluctuance, crepitus, malodor and there is no clinical signs of infection. There is decreased edema around the surgical site and there is no discoloration to the skin. Thompson test is negative. She is able to dorsiflex and plantar flex against resistance and having no pain doing this. Achilles tendon appears intact. No pain with lateral compression of the calcaneous. No other areas of tenderness to the foot/ankle.  There is no pain with calf compression, erythema, warmth.       Assessment and Plan:  Status post left foot surgery, doing well with no complications   -Treatment options discussed including all alternatives, risks, and complications -X-rays were obtained and reviewed. Status post posterior heel spur resection. No evidence of acute fracture identified today. -I want her to continue some gentle ROM of the ankle. I will keep the staples intact until next appointment. Antibiotic ointment and a bandage applied. Keep clean, dry,  intact.  -Dispnesed a smaller CAM boot for her as she thinks the one she has is now to big and rubbing the surgical site.  -NWB at all times.  -Pain medication prn -Continue ice and elevation.  -Monitor for any clinical signs or symptoms of infection and DVT/PE and directed to call the office immediately should any occur or go to the ER. -Follow-up in 10 days or sooner if any problems arise. In the meantime, encouraged to call the office with any questions, concerns, change in symptoms.   Celesta Gentile, DPM

## 2017-02-20 NOTE — Progress Notes (Signed)
DOS 01/18/2017 Left Foot Achilles Tendon Repair; Removal of Calcaneal Spur and lengthening heel cord with cast application.

## 2017-02-21 ENCOUNTER — Other Ambulatory Visit: Payer: Self-pay | Admitting: Cardiovascular Disease

## 2017-02-23 ENCOUNTER — Encounter: Payer: Self-pay | Admitting: Podiatry

## 2017-02-23 ENCOUNTER — Ambulatory Visit (INDEPENDENT_AMBULATORY_CARE_PROVIDER_SITE_OTHER): Payer: Medicare Other | Admitting: Podiatry

## 2017-02-23 DIAGNOSIS — M7732 Calcaneal spur, left foot: Secondary | ICD-10-CM

## 2017-02-23 DIAGNOSIS — Z9889 Other specified postprocedural states: Secondary | ICD-10-CM

## 2017-02-23 DIAGNOSIS — M216X2 Other acquired deformities of left foot: Secondary | ICD-10-CM

## 2017-02-23 NOTE — Progress Notes (Signed)
Subjective: Amber Barnes is a 66 y.o. is seen today in office s/p left heel spur removal, Achilles tendon lysis, gastrocnemius recession preformed on 01/18/2017. She presents today for POV #4. She presents for staple removal. She states that she's been having no patient not taking any pain medication. She has not put any weight to her foot. She denies any recent injury or trauma since last appointment she has no new concerns today. Denies any systemic complaints such as fevers, chills, nausea, vomiting. No calf pain, chest pain, shortness of breath.   Objective: General: No acute distress, AAOx3  DP/PT pulses palpable 2/4, CRT < 3 sec to all digits.  Protective sensation intact. Motor function intact.  Left foot: Incision is well coapted without any evidence of dehiscence and staples are intact and the incision appears to be doing well. There is no surrounding erythema, ascending cellulitis. There is no fluctuance, crepitus, malodor and there is no clinical signs of infection. There is minimal edema around the surgical site and there is no discoloration to the skin. Thompson test is negative. Dorsiflexion and plantar flexion strength is intact. There is no other area tenderness of the menisci this time. Is no other open lesions or pre-ulcer lesions identified. There is no pain with calf compression, erythema, warmth.    Assessment and Plan:  Status post left foot surgery, doing well with no complications   -Treatment options discussed including all alternatives, risks, and complications -Staples removed today without complications. After removal incision remained well coapted. Antibiotic was applied followed by a bandage. She can take the bandage off tomorrow and start to shower as long as the incision remains closed. -In one week she can start to transition to partial weightbearing as tolerated. -Continue ice and elevation -I will see her back in the next 10-14 days. That time likely start  physical therapy. -Monitor for any clinical signs or symptoms of infection and directed to call the office immediately should any occur or go to the ER.  Celesta Gentile, DPM

## 2017-03-06 ENCOUNTER — Ambulatory Visit (INDEPENDENT_AMBULATORY_CARE_PROVIDER_SITE_OTHER): Payer: Self-pay | Admitting: Podiatry

## 2017-03-06 ENCOUNTER — Encounter: Payer: Self-pay | Admitting: Podiatry

## 2017-03-06 VITALS — BP 135/57 | HR 55 | Resp 18

## 2017-03-06 DIAGNOSIS — Z9889 Other specified postprocedural states: Secondary | ICD-10-CM

## 2017-03-06 DIAGNOSIS — M7732 Calcaneal spur, left foot: Secondary | ICD-10-CM

## 2017-03-06 DIAGNOSIS — M216X2 Other acquired deformities of left foot: Secondary | ICD-10-CM

## 2017-03-06 NOTE — Progress Notes (Signed)
Subjective: Amber Barnes is a 66 y.o. is seen today in office s/p left heel spur removal, Achilles tendon lysis, gastrocnemius recession preformed on 01/18/2017. She states that she is doing well. She presents today walking in a CAM boot with a walker. She not having any pain should not been taking any pain medication. She denies any increase in swelling or redness. She has no new concerns today. Denies any systemic complaints such as fevers, chills, nausea, vomiting. No calf pain, chest pain, shortness of breath.   Objective: General: No acute distress, AAOx3  DP/PT pulses palpable 2/4, CRT < 3 sec to all digits.  Protective sensation intact. Motor function intact.  Left foot: Incision is well coapted without any evidence of dehiscence and scar is well formed. There is no tenderness palpation on surgical site. Thompson test is negative and Achilles tendon appears to be intact. There is no pain with lateral compression of calcaneus. There is trace edema there is no erythema or increased warmth. Plantar flexion, dorsiflexion strength intact. No other open lesions or pre-ulcerative lesions. There is no pain with calf compression, erythema, warmth.    Assessment and Plan:  Status post left foot surgery, doing well with no complications   -Treatment options discussed including all alternatives, risks, and complications -At this point she is doing well and start physical therapy. A prescription is provided today for [facility] physical therapy. -Continue weightbearing as tolerated in cam boot. -Continue ice and elevation -I will see her back in3 weeks or sooner if any issues are to arise. -Monitor for any clinical signs or symptoms of infection and directed to call the office immediately should any occur or go to the ER.  Celesta Gentile, DPM

## 2017-03-11 ENCOUNTER — Other Ambulatory Visit: Payer: Self-pay | Admitting: Cardiovascular Disease

## 2017-03-14 ENCOUNTER — Other Ambulatory Visit: Payer: Self-pay | Admitting: Family

## 2017-03-27 ENCOUNTER — Encounter: Payer: Self-pay | Admitting: Podiatry

## 2017-03-27 ENCOUNTER — Ambulatory Visit (INDEPENDENT_AMBULATORY_CARE_PROVIDER_SITE_OTHER): Payer: Medicare Other | Admitting: Podiatry

## 2017-03-27 DIAGNOSIS — M7732 Calcaneal spur, left foot: Secondary | ICD-10-CM

## 2017-03-27 DIAGNOSIS — M779 Enthesopathy, unspecified: Secondary | ICD-10-CM | POA: Diagnosis not present

## 2017-03-27 DIAGNOSIS — M216X2 Other acquired deformities of left foot: Secondary | ICD-10-CM

## 2017-03-27 MED ORDER — DICLOFENAC SODIUM 1 % TD GEL: 2.0000 g | Freq: Four times a day (QID) | TRANSDERMAL | 2 refills | Status: DC
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
## 2017-03-27 NOTE — Addendum Note (Signed)
Addended by: Cranford Mon R on: 03/27/2017 02:31 PM   Modules accepted: Orders

## 2017-04-09 ENCOUNTER — Other Ambulatory Visit: Payer: Self-pay | Admitting: Cardiovascular Disease

## 2017-04-13 ENCOUNTER — Encounter: Payer: Self-pay | Admitting: Podiatry

## 2017-04-13 ENCOUNTER — Other Ambulatory Visit: Payer: Self-pay | Admitting: Orthopedic Surgery

## 2017-04-13 ENCOUNTER — Ambulatory Visit
Admission: RE | Admit: 2017-04-13 | Discharge: 2017-04-13 | Disposition: A | Discharge status: Home / Self Care | Payer: Medicare Other | Source: Ambulatory Visit | Attending: Orthopedic Surgery | Admitting: Orthopedic Surgery

## 2017-04-13 ENCOUNTER — Encounter: Payer: Self-pay | Admitting: Family

## 2017-04-13 ENCOUNTER — Ambulatory Visit (INDEPENDENT_AMBULATORY_CARE_PROVIDER_SITE_OTHER): Payer: Medicare Other | Admitting: Family

## 2017-04-13 VITALS — BP 140/80 | HR 50 | Temp 97.7°F | Ht 65.0 in | Wt 309.4 lb

## 2017-04-13 DIAGNOSIS — M25462 Effusion, left knee: Secondary | ICD-10-CM | POA: Diagnosis not present

## 2017-04-13 DIAGNOSIS — M25562 Pain in left knee: Secondary | ICD-10-CM | POA: Diagnosis present

## 2017-04-13 DIAGNOSIS — R52 Pain, unspecified: Secondary | ICD-10-CM

## 2017-04-13 DIAGNOSIS — M170 Bilateral primary osteoarthritis of knee: Secondary | ICD-10-CM | POA: Diagnosis not present

## 2017-04-13 DIAGNOSIS — M25461 Effusion, right knee: Secondary | ICD-10-CM | POA: Insufficient documentation

## 2017-04-13 DIAGNOSIS — I10 Essential (primary) hypertension: Secondary | ICD-10-CM | POA: Diagnosis not present

## 2017-04-13 DIAGNOSIS — M25561 Pain in right knee: Secondary | ICD-10-CM | POA: Diagnosis present

## 2017-04-13 DIAGNOSIS — G2581 Restless legs syndrome: Secondary | ICD-10-CM

## 2017-04-13 LAB — CBC WITH DIFFERENTIAL/PLATELET
Basophils Absolute: 0.1 10*3/uL (ref 0.0–0.1)
Basophils Relative: 1.1 % (ref 0.0–3.0)
Eosinophils Absolute: 0.1 10*3/uL (ref 0.0–0.7)
Eosinophils Relative: 2.5 % (ref 0.0–5.0)
HCT: 41.1 % (ref 36.0–46.0)
Hemoglobin: 13.4 g/dL (ref 12.0–15.0)
Lymphocytes Relative: 29.1 % (ref 12.0–46.0)
Lymphs Abs: 1.4 10*3/uL (ref 0.7–4.0)
MCHC: 32.6 g/dL (ref 30.0–36.0)
MCV: 92.1 fl (ref 78.0–100.0)
Monocytes Absolute: 0.5 10*3/uL (ref 0.1–1.0)
Monocytes Relative: 9.6 % (ref 3.0–12.0)
Neutro Abs: 2.8 10*3/uL (ref 1.4–7.7)
Neutrophils Relative %: 57.7 % (ref 43.0–77.0)
Platelets: 201 10*3/uL (ref 150.0–400.0)
RBC: 4.46 Mil/uL (ref 3.87–5.11)
RDW: 14.1 % (ref 11.5–15.5)
WBC: 4.9 10*3/uL (ref 4.0–10.5)

## 2017-04-13 LAB — COMPREHENSIVE METABOLIC PANEL
ALT: 23 U/L (ref 0–35)
AST: 17 U/L (ref 0–37)
Albumin: 4 g/dL (ref 3.5–5.2)
Alkaline Phosphatase: 81 U/L (ref 39–117)
BUN: 19 mg/dL (ref 6–23)
CO2: 27 mEq/L (ref 19–32)
Calcium: 9.1 mg/dL (ref 8.4–10.5)
Chloride: 103 mEq/L (ref 96–112)
Creatinine, Ser: 0.77 mg/dL (ref 0.40–1.20)
GFR: 80.12 mL/min (ref >60.00)
Glucose, Bld: 95 mg/dL (ref 70–99)
Potassium: 4.3 mEq/L (ref 3.5–5.1)
Sodium: 139 mEq/L (ref 135–145)
Total Bilirubin: 0.5 mg/dL (ref 0.2–1.2)
Total Protein: 7.1 g/dL (ref 6.0–8.3)

## 2017-04-13 LAB — IBC PANEL
Iron: 105 ug/dL (ref 42–145)
Saturation Ratios: 24.3 % (ref 20.0–50.0)
Transferrin: 309 mg/dL (ref 212.0–360.0)

## 2017-04-13 LAB — HEMOGLOBIN A1C: Hgb A1c MFr Bld: 5 % (ref 4.6–6.5)

## 2017-04-13 LAB — FERRITIN: Ferritin: 41.8 ng/mL (ref 10.0–291.0)

## 2017-04-13 MED ORDER — GABAPENTIN 100 MG PO CAPS: 200.0000 mg | ORAL_CAPSULE | Freq: Two times a day (BID) | ORAL | 2 refills | Status: DC

## 2017-04-13 NOTE — Patient Instructions (Addendum)
Labs today  Gabapentin at bedtime  Monitor blood pressure,  Goal is less than 130/80; if persistently higher, please make sooner follow up appointment so we can recheck you blood pressure and manage medications   Pleasure seeing you  Restless Legs Syndrome Restless legs syndrome is a condition that causes uncomfortable feelings or sensations in the legs, especially while sitting or lying down. The sensations usually cause an overwhelming urge to move the legs. The arms can also sometimes be affected. The condition can range from mild to severe. The symptoms often interfere with a person's ability to sleep. What are the causes? The cause of this condition is not known. What increases the risk? This condition is more likely to develop in:  People who are older than age 24.  Pregnant women. In general, restless legs syndrome is more common in women than in men.  People who have a family history of the condition.  People who have certain medical conditions, such as iron deficiency, kidney disease, Parkinson disease, or nerve damage.  People who take certain medicines, such as medicines for high blood pressure, nausea, colds, allergies, depression, and some heart conditions.  What are the signs or symptoms? The main symptom of this condition is uncomfortable sensations in the legs. These sensations may be:  Described as pulling, tingling, prickling, throbbing, crawling, or burning.  Worse while you are sitting or lying down.  Worse during periods of rest or inactivity.  Worse at night, often interfering with your sleep.  Accompanied by a very strong urge to move your legs.  Temporarily relieved by movement of your legs.  The sensations usually affect both sides of the body. The arms can also be affected, but this is rare. People who have this condition often have tiredness during the day because of their lack of sleep at night. How is this diagnosed? This condition may be  diagnosed based on your description of the symptoms. You may also have tests, including blood tests, to check for other conditions that may lead to your symptoms. In some cases, you may be asked to spend some time in a sleep lab so your sleeping can be monitored. How is this treated? Treatment for this condition is focused on managing the symptoms. Treatment may include:  Self-help and lifestyle changes.  Medicines.  Follow these instructions at home:  Take medicines only as directed by your health care provider.  Try these methods to get temporary relief from the uncomfortable sensations: ? Massage your legs. ? Walk or stretch. ? Take a cold or hot bath.  Practice good sleep habits. For example, go to bed and get up at the same time every day.  Exercise regularly.  Practice ways of relaxing, such as yoga or meditation.  Avoid caffeine and alcohol.  Do not use any tobacco products, including cigarettes, chewing tobacco, or electronic cigarettes. If you need help quitting, ask your health care provider.  Keep all follow-up visits as directed by your health care provider. This is important. Contact a health care provider if: Your symptoms do not improve with treatment, or they get worse. This information is not intended to replace advice given to you by your health care provider. Make sure you discuss any questions you have with your health care provider. Document Released: 06/17/2002 Document Revised: 12/03/2015 Document Reviewed: 06/23/2014 Elsevier Interactive Patient Education  [employer].

## 2017-04-13 NOTE — Assessment & Plan Note (Signed)
mildly elevated today. Patient and I jointly agreed lack of sleep for the past few days may have affected blood pressure. She will keep a close monitor on blood pressure and call me with a log so we can titrate medications.

## 2017-04-13 NOTE — Assessment & Plan Note (Signed)
Symptoms consistent with restless leg syndrome. Pending iron studies. Also trial gabapentin for symptoms. Return precautions given.

## 2017-04-13 NOTE — Progress Notes (Signed)
Subjective:    Patient ID: Amber Barnes, female    DOB: Jan 09, 1951, 66 y.o.   MRN: [id number]  CC: Amber Barnes is a 66 y.o. female who presents today for follow up.   HPI: Complains of 'restless legs' , for years, however worsening since surgery 3 months ago. Keeping her up at night and affecting sleep.   Had used gabapentin for left arm pain, which resolve. Notes that during the time in which she was gabapentin, she didn't notice restless legs  Afib- has been in NSR for 3-4 months. Denies exertional chest pain or pressure, numbness or tingling radiating to left arm or jaw, palpitations, dizziness, frequent headaches, changes in vision, or shortness of breath.  On eliquis until she follows up with [facility] 06/2017. Still on ranexa and amiodorone.   Frustrated by weight and had started PT and working out/ walking.     HISTORY:  Past Medical History:  Diagnosis Date  . Achilles tendinitis   . Acute medial meniscal tear   . Allergy   . Anxiety   . Arthritis   . Atrial fibrillation ([place])    a. new onset 07/2016; b. CHADS2VASc --> 2 (HTN, female); c. eliquis  . Chronic lower back pain   . COPD (chronic obstructive pulmonary disease) ([place])   . Depression   . Fibrocystic breast disease   . GERD (gastroesophageal reflux disease)   . Hyperlipidemia   . Hypertension   . Insomnia   . Lipoma   . Osteoarthritis    left knee  . Osteopenia   . Tendonitis   . Thyroid disease   . Vitamin D deficiency    Past Surgical History:  Procedure Laterality Date  . ABDOMINAL HYSTERECTOMY     ovaries left  . ACHILLES TENDON SURGERY    . BACK SURGERY    . CARDIOVERSION N/A 09/07/2016   Procedure: CARDIOVERSION;  Surgeon: Minna Merritts, MD;  Location: [facility] ORS;  Service: Cardiovascular;  Laterality: N/A;  . CARDIOVERSION N/A 10/19/2016   Procedure: CARDIOVERSION;  Surgeon: Minna Merritts, MD;  Location: [facility] ORS;  Service: Cardiovascular;  Laterality: N/A;  . CARDIOVERSION N/A  12/14/2016   Procedure: CARDIOVERSION;  Surgeon: Minna Merritts, MD;  Location: [facility] ORS;  Service: Cardiovascular;  Laterality: N/A;  . COLONOSCOPY    . ESOPHAGOGASTRODUODENOSCOPY    . THYROIDECTOMY     thyroid lobectomy secondary to a benign nodule   Family History  Problem Relation Age of Onset  . Cancer Sister 57       ovarian  . Cancer Father 41       colon  . Heart disease Father   . Heart attack Father 19       died from MI  . Hyperlipidemia Father   . Hypertension Father   . Cancer Mother        leukemia  . Breast cancer Paternal Aunt        40's    Allergies: Prednisone; Hydrocodone-acetaminophen; Oxycodone; Adhesive [tape]; and Latex Current Outpatient Prescriptions on File Prior to Visit  Medication Sig Dispense Refill  . amiodarone (PACERONE) 200 MG tablet Take 1 tablet (200 mg total) by mouth 2 (two) times daily. 70 tablet 6  . apixaban (ELIQUIS) 5 MG TABS tablet Take 1 tablet (5 mg total) by mouth 2 (two) times daily. 60 tablet 3  . B Complex Vitamins (VITAMIN B COMPLEX PO) Take 1 tablet by mouth daily.     . bisoprolol (ZEBETA) 10 MG tablet TAKE  1 TABLET (10 MG TOTAL) BY MOUTH 2 (TWO) TIMES DAILY. 60 tablet 3  . busPIRone (BUSPAR) 10 MG tablet TAKE 1 TABLET BY MOUTH TWICE A DAY 90 tablet 0  . calcium gluconate 500 MG tablet Take 500 tablets by mouth daily.    . Cholecalciferol (VITAMIN D3) 5000 units CAPS Take 5,000 Units by mouth daily.    . diclofenac sodium (VOLTAREN) 1 % GEL Apply 2 g topically 4 (four) times daily. Rub into affected area of foot 2 to 4 times daily 100 g 2  . DULoxetine (CYMBALTA) 60 MG capsule Take 2 capsules (120 mg total) by mouth daily. 180 capsule 2  . esomeprazole (NEXIUM) 20 MG capsule Take 20 mg by mouth daily as needed (acid reflux).    . furosemide (LASIX) 20 MG tablet Take 1 tablet (20 mg total) by mouth 2 (two) times daily. (Patient taking differently: Take 20 mg by mouth 2 (two) times daily. May take an additional 20 mgs as needed  for swelling) 180 tablet 3  . HYDROmorphone (DILAUDID) 2 MG tablet Take 1 tablet (2 mg total) by mouth every 6 (six) hours as needed for severe pain. 20 tablet 0  . HYDROmorphone (DILAUDID) 2 MG tablet Take 1 tablet (2 mg total) by mouth every 6 (six) hours as needed for severe pain. 20 tablet 0  . KLOR-CON 10 10 MEQ tablet TAKE 1 TABLET (10 MEQ TOTAL) BY MOUTH 2 (TWO) TIMES DAILY. 60 tablet 2  . Multiple Vitamin (MULTIVITAMIN) tablet Take 1 tablet by mouth daily.    Marland Kitchen oxymetazoline (AFRIN) 0.05 % nasal spray Place 1 spray into both nostrils at bedtime as needed for congestion.     Marland Kitchen RANEXA 500 MG 12 hr tablet TAKE 1 TABLET BY MOUTH TWICE A DAY 60 tablet 2  . rosuvastatin (CRESTOR) 20 MG tablet Take 1 tablet (20 mg total) by mouth daily. 90 tablet 1  . sennosides-docusate sodium (SENOKOT-S) 8.6-50 MG tablet Take 1 tablet by mouth at bedtime as needed for constipation.     . [DISCONTINUED] omeprazole (PRILOSEC) 20 MG capsule Take 20 mg by mouth daily.     No current facility-administered medications on file prior to visit.     Social History  Substance Use Topics  . Smoking status: Former Smoker    Packs/day: 0.25    Years: 10.00    Types: Cigarettes  . Smokeless tobacco: Former Systems developer    Quit date: 07/11/2001     Comment: smoked for 10 years  . Alcohol use Yes     Comment: social, occasional    Review of Systems  Constitutional: Negative for chills and fever.  Respiratory: Negative for cough.   Cardiovascular: Negative for chest pain and palpitations.  Gastrointestinal: Negative for nausea and vomiting.  Neurological: Negative for numbness.      Objective:    BP 140/80   Pulse (!) 50   Temp 97.7 F (36.5 C) (Oral)   Ht 5\' 5"  (1.651 m)   Wt (!) 309 lb 6.4 oz (140.3 kg)   SpO2 96%   BMI 51.49 kg/m  BP Readings from Last 3 Encounters:  04/13/17 140/80  03/06/17 (!) 135/57  02/13/17 122/60   Wt Readings from Last 3 Encounters:  04/13/17 (!) 309 lb 6.4 oz (140.3 kg)    12/20/16 (!) 301 lb 8 oz (136.8 kg)  12/14/16 299 lb (135.6 kg)    Physical Exam  Constitutional: She appears well-developed and well-nourished.  Eyes: Conjunctivae are normal.  Cardiovascular: Normal  rate, regular rhythm, normal heart sounds and normal pulses.   Pulmonary/Chest: Effort normal and breath sounds normal. She has no wheezes. She has no rhonchi. She has no rales.  Neurological: She is alert.  Skin: Skin is warm and dry.  Psychiatric: She has a normal mood and affect. Her speech is normal and behavior is normal. Thought content normal.  Vitals reviewed.      Assessment & Plan:   Problem List Items Addressed This Visit      Cardiovascular and Mediastinum   Essential hypertension    mildly elevated today. Patient and I jointly agreed lack of sleep for the past few days may have affected blood pressure. She will keep a close monitor on blood pressure and call me with a log so we can titrate medications.        Other   Restless leg - Primary    Symptoms consistent with restless leg syndrome. Pending iron studies. Also trial gabapentin for symptoms. Return precautions given.      Relevant Medications   gabapentin (NEURONTIN) 100 MG capsule   Other Relevant Orders   CBC with Differential/Platelet   Comprehensive metabolic panel   Hemoglobin A1c   Ferritin   IBC panel       I have changed Ms. Stiefel's gabapentin. I am also having her maintain her multivitamin, B Complex Vitamins (VITAMIN B COMPLEX PO), furosemide, amiodarone, apixaban, DULoxetine, oxymetazoline, sennosides-docusate sodium, rosuvastatin, Vitamin D3, esomeprazole, calcium gluconate, bisoprolol, HYDROmorphone, HYDROmorphone, RANEXA, busPIRone, diclofenac sodium, and KLOR-CON 10.   Meds ordered this encounter  Medications  . gabapentin (NEURONTIN) 100 MG capsule    Sig: Take 2 capsules (200 mg total) by mouth 2 (two) times daily.    Dispense:  90 capsule    Refill:  2    Order Specific Question:    Supervising Provider    Answer:   Crecencio Mc [[id number]]    Return precautions given.   Risks, benefits, and alternatives of the medications and treatment plan prescribed today were discussed, and patient expressed understanding.   Education regarding symptom management and diagnosis given to patient on AVS.  Continue to follow with [name], FNP for routine health maintenance.   [name] and I agreed with plan.   [name], FNP

## 2017-04-16 ENCOUNTER — Other Ambulatory Visit: Payer: Self-pay | Admitting: Cardiovascular Disease

## 2017-04-24 ENCOUNTER — Encounter: Payer: Medicare Other | Admitting: Podiatry

## 2017-04-25 ENCOUNTER — Other Ambulatory Visit: Payer: Self-pay | Admitting: Family

## 2017-04-25 ENCOUNTER — Ambulatory Visit (INDEPENDENT_AMBULATORY_CARE_PROVIDER_SITE_OTHER): Payer: Medicare Other | Admitting: Podiatry

## 2017-04-25 ENCOUNTER — Encounter: Payer: Self-pay | Admitting: Podiatry

## 2017-04-25 DIAGNOSIS — M216X2 Other acquired deformities of left foot: Secondary | ICD-10-CM

## 2017-04-25 DIAGNOSIS — M7732 Calcaneal spur, left foot: Secondary | ICD-10-CM

## 2017-04-25 DIAGNOSIS — M779 Enthesopathy, unspecified: Secondary | ICD-10-CM

## 2017-04-25 NOTE — Progress Notes (Signed)
Subjective: Amber Barnes is a 66 y.o. is seen today in office s/p left heel spur removal, Achilles tendolysis, gastrocnemius recession preformed on 01/18/2017. She states overall she is doing well. She actually went on beachchair up with her friends and did shopping a lot of walking and she did well. She said some discomfort and allow to walk and she would sit in nice the area and after some time she get back up and she would do well with no pain. She is also having some pain to her knee she's follow up with orthopedics had an injection to her knee and since having decreased pain to her knee she's been up to walk better on her left foot. She still gets occasional pain in the side of her foot however this is also improved but she states the surgical site is doing very well and she's had no problems. She's been able to wear regular shoe without any issues as well. She has no other concerns today. Denies any systemic complaints such as fevers, chills, nausea, vomiting. No calf pain, chest pain, shortness of breath.   Objective: General: No acute distress, AAOx3  DP/PT pulses palpable 2/4, CRT < 3 sec to all digits.  Protective sensation intact. Motor function intact.  Left foot: Incision is well coapted without any evidence of dehiscence and scar is well formed. There is no erythema or signs of infection on the incision. There is trace swelling here there is no pain along the course of the peroneal tendons appear to be intact. Thompson test is negative Achilles tendon appears to be intact as well. There is no smoking tenderness palpation of the surgical site and overall she is doing very well. There is no pain with lateral compression of the calcaneus there is no pain on course or insertion of the plantar fascia. No open lesions or pre-ulcerative lesions. There is no pain with calf compression, erythema, warmth.    Assessment and Plan:  Status post left foot surgery, doing well with no complications    -Treatment options discussed including all alternatives, risks, and complications -At this time she is doing well. She is also set 1 physical therapy last week and she's been doing home exercises and she feels that she is still making good improvements by doing this. I can't continue with this as well as icing to the area. Continue supportive shoe gear as well. She can start to use a stationary bike as she was inquiring about this and also gradually increase in activity level. -Follow-up in 6 weeks or sooner if needed. Call any questions or concerns  Celesta Gentile, DPM

## 2017-04-29 ENCOUNTER — Other Ambulatory Visit: Payer: Self-pay | Admitting: Cardiovascular Disease

## 2017-04-30 ENCOUNTER — Emergency Department: Payer: Medicare Other

## 2017-04-30 ENCOUNTER — Emergency Department
Admission: EM | Admit: 2017-04-30 | Discharge: 2017-04-30 | Disposition: A | Discharge status: Home / Self Care | Payer: Medicare Other | Attending: Emergency Medicine | Admitting: Emergency Medicine

## 2017-04-30 DIAGNOSIS — W182XXA Fall in (into) shower or empty bathtub, initial encounter: Secondary | ICD-10-CM | POA: Insufficient documentation

## 2017-04-30 DIAGNOSIS — J449 Chronic obstructive pulmonary disease, unspecified: Secondary | ICD-10-CM | POA: Insufficient documentation

## 2017-04-30 DIAGNOSIS — Y939 Activity, unspecified: Secondary | ICD-10-CM | POA: Insufficient documentation

## 2017-04-30 DIAGNOSIS — R079 Chest pain, unspecified: Secondary | ICD-10-CM | POA: Diagnosis not present

## 2017-04-30 DIAGNOSIS — Y999 Unspecified external cause status: Secondary | ICD-10-CM | POA: Diagnosis not present

## 2017-04-30 DIAGNOSIS — I10 Essential (primary) hypertension: Secondary | ICD-10-CM | POA: Insufficient documentation

## 2017-04-30 DIAGNOSIS — Z91040 Latex allergy status: Secondary | ICD-10-CM | POA: Diagnosis not present

## 2017-04-30 DIAGNOSIS — S098XXA Other specified injuries of head, initial encounter: Secondary | ICD-10-CM | POA: Insufficient documentation

## 2017-04-30 DIAGNOSIS — Y92002 Bathroom of unspecified non-institutional (private) residence single-family (private) house as the place of occurrence of the external cause: Secondary | ICD-10-CM | POA: Diagnosis not present

## 2017-04-30 DIAGNOSIS — Z7901 Long term (current) use of anticoagulants: Secondary | ICD-10-CM | POA: Insufficient documentation

## 2017-04-30 DIAGNOSIS — Z79899 Other long term (current) drug therapy: Secondary | ICD-10-CM | POA: Diagnosis not present

## 2017-04-30 DIAGNOSIS — Z87891 Personal history of nicotine dependence: Secondary | ICD-10-CM | POA: Diagnosis not present

## 2017-04-30 DIAGNOSIS — S0990XA Unspecified injury of head, initial encounter: Secondary | ICD-10-CM

## 2017-04-30 LAB — BASIC METABOLIC PANEL
Anion gap: 11 (ref 5–15)
BUN: 19 mg/dL (ref 6–20)
CO2: 26 mmol/L (ref 22–32)
Calcium: 8.7 mg/dL — ABNORMAL LOW (ref 8.9–10.3)
Chloride: 102 mmol/L (ref 101–111)
Creatinine, Ser: 0.78 mg/dL (ref 0.44–1.00)
GFR calc Af Amer: >60 mL/min (ref >60)
GFR calc non Af Amer: >60 mL/min (ref >60)
Glucose, Bld: 104 mg/dL — ABNORMAL HIGH (ref 65–99)
Potassium: 4.1 mmol/L (ref 3.5–5.1)
Sodium: 139 mmol/L (ref 135–145)

## 2017-04-30 LAB — CBC
HCT: 40.7 % (ref 35.0–47.0)
Hemoglobin: 13.6 g/dL (ref 12.0–16.0)
MCH: 29.9 pg (ref 26.0–34.0)
MCHC: 33.5 g/dL (ref 32.0–36.0)
MCV: 89 fL (ref 80.0–100.0)
Platelets: 196 10*3/uL (ref 150–440)
RBC: 4.57 MIL/uL (ref 3.80–5.20)
RDW: 13.9 % (ref 11.5–14.5)
WBC: 6.3 10*3/uL (ref 3.6–11.0)

## 2017-04-30 LAB — TROPONIN I: Troponin I: <0.03 ng/mL (ref <0.03)

## 2017-04-30 LAB — PROTIME-INR
INR: 1.05
Prothrombin Time: 13.6 seconds (ref 11.4–15.2)

## 2017-04-30 MED ORDER — ACETAMINOPHEN 325 MG PO TABS
650.0000 mg | ORAL_TABLET | Freq: Once | ORAL | Status: AC
  Administered 2017-04-30: 650 mg via ORAL
  Filled 2017-04-30: qty 2

## 2017-04-30 NOTE — ED Triage Notes (Signed)
Patient presents to the ED post fall that was 20 min. Prior to arrival.  Patient states she fell backwards in the bathtub and hit her head.  Patient takes eliquis.  Patient is now complaining of headache, dizziness, and chest pain.  Patient states she was not feeling dizzy prior to fall.  Patient denies confusion and speech is clear at this time.

## 2017-04-30 NOTE — ED Notes (Signed)
AAOx3.  Skin warm and dry.  NAD 

## 2017-04-30 NOTE — ED Provider Notes (Addendum)
[facility] Emergency Department Provider Note  ____________________________________________   I have reviewed the triage vital signs and the nursing notes.   HISTORY  Chief Complaint Fall and Dizziness    HPI Amber Barnes is a 66 y.o. female who takes Eliquis because of a history of atrial fibrillation although she is no longer in atrial fibrillation and has not been for some time. Patient states that she was leaning on the side of her tub putting in the stopper and she lost her balance and fell backwards a few feet into the tub and bumped her head. Did not pass out. No other injury. She has a slight headache, which is slowly getting better. No focal neurologic deficits. She did feel somewhat lightheaded after banging her head.  pain is to the occiput, no radiation, sharp, nothing makes it better except for time nothing makes it worse except for contact, no other alleviating or remitting symptoms and no other prior interventions.this is a non-syncopal event, patient failed completely normal for accidentally falling into the tub by losing her balance.  Past Medical History:  Diagnosis Date  . Achilles tendinitis   . Acute medial meniscal tear   . Allergy   . Anxiety   . Arthritis   . Atrial fibrillation ([place])    a. new onset 07/2016; b. CHADS2VASc --> 2 (HTN, female); c. eliquis  . Chronic lower back pain   . COPD (chronic obstructive pulmonary disease) ([place])   . Depression   . Fibrocystic breast disease   . GERD (gastroesophageal reflux disease)   . Hyperlipidemia   . Hypertension   . Insomnia   . Lipoma   . Osteoarthritis    left knee  . Osteopenia   . Tendonitis   . Thyroid disease   . Vitamin D deficiency     Patient Active Problem List   Diagnosis Date Noted  . Restless leg 04/13/2017  . OSA (obstructive sleep apnea) 01/09/2017  . Acute pain of left shoulder 11/28/2016  . A-fib ([place]) 11/28/2016  . Persistent atrial fibrillation ([place])  08/11/2016  . Acute pulmonary edema (HCC)   . New onset a-fib ([place]) 07/28/2016  . Hypokalemia 07/28/2016  . Left foot pain 07/28/2016  . Atypical chest pain   . Atrial fibrillation with RVR ([place]) 07/27/2016  . Fatty liver 06/30/2016  . Dizziness 06/08/2016  . Family history of ovarian cancer 06/08/2016  . GERD (gastroesophageal reflux disease) 05/05/2016  . Seasonal rhinitis 05/05/2016  . Routine physical examination 05/05/2016  . Heel spur 11/05/2015  . Tendonitis 11/05/2015  . SOB (shortness of breath) 11/11/2014  . Morbid obesity ([place]) 11/11/2014  . Mixed hyperlipidemia 11/11/2014  . Essential hypertension 11/11/2014  . Anxiety and depression 11/11/2014  . Arthritis, senescent 11/11/2014  . Weight loss due to medication 11/11/2014  . Obesity, Class III, BMI 40-49.9 (morbid obesity) ([place]) 04/11/2013    Past Surgical History:  Procedure Laterality Date  . ABDOMINAL HYSTERECTOMY     ovaries left  . ACHILLES TENDON SURGERY    . BACK SURGERY    . CARDIOVERSION N/A 09/07/2016   Procedure: CARDIOVERSION;  Surgeon: Minna Merritts, MD;  Location: [facility] ORS;  Service: Cardiovascular;  Laterality: N/A;  . CARDIOVERSION N/A 10/19/2016   Procedure: CARDIOVERSION;  Surgeon: Minna Merritts, MD;  Location: [facility] ORS;  Service: Cardiovascular;  Laterality: N/A;  . CARDIOVERSION N/A 12/14/2016   Procedure: CARDIOVERSION;  Surgeon: Minna Merritts, MD;  Location: [facility] ORS;  Service: Cardiovascular;  Laterality: N/A;  .  COLONOSCOPY    . ESOPHAGOGASTRODUODENOSCOPY    . THYROIDECTOMY     thyroid lobectomy secondary to a benign nodule    Prior to Admission medications   Medication Sig Start Date End Date Taking? Authorizing Provider  amiodarone (PACERONE) 200 MG tablet Take 1 tablet (200 mg total) by mouth 2 (two) times daily. 09/20/16   Minna Merritts, MD  B Complex Vitamins (VITAMIN B COMPLEX PO) Take 1 tablet by mouth daily.     [provider]  bisoprolol (ZEBETA) 10 MG  tablet TAKE 1 TABLET (10 MG TOTAL) BY MOUTH 2 (TWO) TIMES DAILY. 01/12/17   Minna Merritts, MD  busPIRone (BUSPAR) 10 MG tablet TAKE 1 TABLET BY MOUTH TWICE A DAY 04/25/17   [name], FNP  calcium gluconate 500 MG tablet Take 500 tablets by mouth daily.    [provider]  Cholecalciferol (VITAMIN D3) 5000 units CAPS Take 5,000 Units by mouth daily.    [provider]  diclofenac sodium (VOLTAREN) 1 % GEL Apply 2 g topically 4 (four) times daily. Rub into affected area of foot 2 to 4 times daily 03/27/17   Trula Slade, DPM  DULoxetine (CYMBALTA) 60 MG capsule Take 2 capsules (120 mg total) by mouth daily. 09/30/16   [name], FNP  ELIQUIS 5 MG TABS tablet TAKE 1 TABLET BY MOUTH TWICE A DAY 04/17/17   Minna Merritts, MD  esomeprazole (NEXIUM) 20 MG capsule Take 20 mg by mouth daily as needed (acid reflux).    [provider]  furosemide (LASIX) 20 MG tablet Take 1 tablet (20 mg total) by mouth 2 (two) times daily. Patient taking differently: Take 20 mg by mouth 2 (two) times daily. May take an additional 20 mgs as needed for swelling 09/12/16   Minna Merritts, MD  gabapentin (NEURONTIN) 100 MG capsule Take 2 capsules (200 mg total) by mouth 2 (two) times daily. 04/13/17   [name], FNP  HYDROmorphone (DILAUDID) 2 MG tablet Take 1 tablet (2 mg total) by mouth every 6 (six) hours as needed for severe pain. 01/23/17   Trula Slade, DPM  HYDROmorphone (DILAUDID) 2 MG tablet Take 1 tablet (2 mg total) by mouth every 6 (six) hours as needed for severe pain. 02/06/17   Trula Slade, DPM  KLOR-CON 10 10 MEQ tablet TAKE 1 TABLET (10 MEQ TOTAL) BY MOUTH 2 (TWO) TIMES DAILY. 04/10/17   Minna Merritts, MD  Multiple Vitamin (MULTIVITAMIN) tablet Take 1 tablet by mouth daily.    [provider]  oxymetazoline (AFRIN) 0.05 % nasal spray Place 1 spray into both nostrils at bedtime as needed for congestion.     [provider]  RANEXA 500 MG 12 hr tablet TAKE 1 TABLET BY MOUTH TWICE A DAY 02/21/17   Minna Merritts, MD  rosuvastatin (CRESTOR) 20 MG tablet Take 1 tablet (20 mg total) by mouth daily. 11/07/16   [name], FNP  sennosides-docusate sodium (SENOKOT-S) 8.6-50 MG tablet Take 1 tablet by mouth at bedtime as needed for constipation.     [provider]    Allergies Prednisone; Hydrocodone-acetaminophen; Oxycodone; Adhesive [tape]; and Latex  Family History  Problem Relation Age of Onset  . Cancer Sister 27       ovarian  . Cancer Father 52       colon  . Heart disease Father   . Heart attack Father 50  died from MI  . Hyperlipidemia Father   . Hypertension Father   . Cancer Mother        leukemia  . Breast cancer Paternal Aunt        25's    Social History Social History  Substance Use Topics  . Smoking status: Former Smoker    Packs/day: 0.25    Years: 10.00    Types: Cigarettes  . Smokeless tobacco: Former Systems developer    Quit date: 07/11/2001     Comment: smoked for 10 years  . Alcohol use Yes     Comment: social, occasional    Review of Systems Constitutional: No fever/chills Eyes: No visual changes. ENT: No sore throat. No stiff neck no neck pain Cardiovascular: Denies chest pain. Respiratory: Denies shortness of breath. Gastrointestinal:   no vomiting.  No diarrhea.  No constipation. Genitourinary: Negative for dysuria. Musculoskeletal: Negative lower extremity swelling Skin: Negative for rash. Neurological: Negative for severe headaches, focal weakness or numbness.   ____________________________________________   PHYSICAL EXAM:  VITAL SIGNS: ED Triage Vitals [04/30/17 1052]  Enc Vitals Group     BP (!) 188/86     Pulse Rate (!) 50     Resp 18     Temp (!) 97.5 F (36.4 C)     Temp Source Oral     SpO2 95 %     Weight 290 lb (131.5 kg)     Height 5\' 5"  (1.651 m)     Head Circumference      Peak Flow      Pain Score 10     Pain Loc       Pain Edu?      Excl. in [place]?     Constitutional: Alert and oriented. Well appearing and in no acute distress. Eyes: Conjunctivae are normal Head: no obvious bruising or skull fracture or other significant trauma mild tenderness noted to the occiput which reproduces the patient's discomfort HEENT: No congestion/rhinnorhea. Mucous membranes are moist.  Oropharynx non-erythematous Neck:   Nontender with no meningismus, no masses, no stridor Cardiovascular: Normal rate, regular rhythm. Grossly normal heart sounds.  Good peripheral circulation. Respiratory: Normal respiratory effort.  No retractions. Lungs CTAB. Abdominal: Soft and nontender. No distention. No guarding no rebound Back:  There is no focal tenderness or step off.  there is no midline tenderness there are no lesions noted. there is no CVA tenderness Musculoskeletal: No lower extremity tenderness, no upper extremity tenderness. No joint effusions, no DVT signs strong distal pulses no edema Neurologic:  Normal speech and language. No gross focal neurologic deficits are appreciated.  Skin:  Skin is warm, dry and intact. No rash noted. Psychiatric: Mood and affect are mildly anxious. Speech and behavior are normal.  ____________________________________________   LABS (all labs ordered are listed, but only abnormal results are displayed)  Labs Reviewed  CBC  PROTIME-INR  BASIC METABOLIC PANEL  URINALYSIS, COMPLETE (UACMP) WITH MICROSCOPIC  TROPONIN I  CBG MONITORING, ED    Pertinent labs  results that were available during my care of the patient were reviewed by me and considered in my medical decision making (see chart for details). ____________________________________________  EKG  I personally interpreted any EKGs ordered by me or triage sinus bradycardia rate 40 90 acute ST elevation or depressionLAD noted, no acute ischemic changes. ____________________________________________  RADIOLOGY  Pertinent labs & imaging  results that were available during my care of the patient were reviewed by me and considered in my medical decision  making (see chart for details). If possible, patient and/or family made aware of any abnormal findings. ____________________________________________    PROCEDURES  Procedure(s) performed: None  Procedures  Critical Care performed: None  ____________________________________________   INITIAL IMPRESSION / ASSESSMENT AND PLAN / ED COURSE  Pertinent labs & imaging results that were available during my care of the patient were reviewed by me and considered in my medical decision making (see chart for details).  patient had a nonsurgical fall bumped her head, no evidence of skull fracture on exam less suspicion for acute intracranial pathologyhim a normal neurologic exam, however given that she did bump her head,  we have obtained CT scan which to my reading appears to be reassuring. Because patient was putting of feeling somewhat lightheaded after banging her head we also obtained blood work results as far as reassuring, patient has some degree of bradycardia,which is certainly notresult of her fall is no evidence of cushingoid symptoms etc. Patient does have a history of outpatient hydromorphone use. I will however start with Tylenol for her headache is a don't usually give narcotics for mild headaches after he bumped her head falling into the tub from sitting position. This is a very short distance to fall there is no evidence of significant trauma on exam.of note patient had a stress test in general 2018 which showed no ischemia, EF was 55-65%, she had cardioversion in February and it sounds as if she is been out of atrial fibrillation since that time to the extent that I can tell. I did suggest the patient that she should consider whether she wants to stay on liquids that she does have a history of recurrent falls in the past including today. Pts bradycardia is likely 2/2 amiodarone.  She is adamant that she did not have a syncopal fall. Hr at last visit that I can find was also 57.   ----------------------------------------- 12:43 PM on 04/30/2017 -----------------------------------------  she has no complaints or symptoms this time is eager for discharge, razor neurologically intact, workup is reassuring I think urinalysis is indicated for a nonsurgical fall, blood work is reassuring, heart rate as noted his low patient states it always is like that in the notices because of her medication and they're talking about possibly titrating that in the future but at this time there is no indication to do so. extensive return precautions and follow-up instructions given and understood as customary.but lesser is asymptomatic with elevated here, patient did not take her blood pressure medication this morning, we'll have her take it when she gets home. Patient states her blood pressures often elevated when she is around physicians etc. At this time is no indication that there is other pathology present and patient was eager to leave. Return percussion and understood.   ____________________________________________   FINAL CLINICAL IMPRESSION(S) / ED DIAGNOSES  Final diagnoses:  None      This chart was dictated using voice recognition software.  Despite best efforts to proofread,  errors can occur which can change meaning.      Schuyler Amor, MD 04/30/17 1200    Schuyler Amor, MD 04/30/17 4765    Schuyler Amor, MD 04/30/17 1225    Schuyler Amor, MD 04/30/17 1244    Schuyler Amor, MD 04/30/17 1250

## 2017-05-03 ENCOUNTER — Other Ambulatory Visit: Payer: Self-pay | Admitting: Family

## 2017-05-03 DIAGNOSIS — K760 Fatty (change of) liver, not elsewhere classified: Secondary | ICD-10-CM

## 2017-05-10 ENCOUNTER — Other Ambulatory Visit: Payer: Self-pay | Admitting: Orthopedic Surgery

## 2017-05-10 ENCOUNTER — Other Ambulatory Visit: Payer: Self-pay

## 2017-05-10 DIAGNOSIS — M25562 Pain in left knee: Secondary | ICD-10-CM

## 2017-05-10 MED ORDER — POTASSIUM CHLORIDE ER 10 MEQ PO TBCR: 10.0000 meq | EXTENDED_RELEASE_TABLET | Freq: Two times a day (BID) | ORAL | 2 refills | Status: DC

## 2017-05-14 ENCOUNTER — Other Ambulatory Visit: Payer: Self-pay | Admitting: Cardiovascular Disease

## 2017-05-15 ENCOUNTER — Other Ambulatory Visit: Payer: Self-pay | Admitting: Cardiovascular Disease

## 2017-05-17 ENCOUNTER — Ambulatory Visit
Admission: RE | Admit: 2017-05-17 | Discharge: 2017-05-17 | Disposition: A | Discharge status: Home / Self Care | Payer: Medicare Other | Source: Ambulatory Visit | Attending: Orthopedic Surgery | Admitting: Orthopedic Surgery

## 2017-05-17 DIAGNOSIS — S83242A Other tear of medial meniscus, current injury, left knee, initial encounter: Secondary | ICD-10-CM | POA: Diagnosis not present

## 2017-05-17 DIAGNOSIS — X58XXXA Exposure to other specified factors, initial encounter: Secondary | ICD-10-CM | POA: Diagnosis not present

## 2017-05-17 DIAGNOSIS — M7122 Synovial cyst of popliteal space [Baker], left knee: Secondary | ICD-10-CM | POA: Insufficient documentation

## 2017-05-17 DIAGNOSIS — M1712 Unilateral primary osteoarthritis, left knee: Secondary | ICD-10-CM | POA: Insufficient documentation

## 2017-05-17 DIAGNOSIS — M25562 Pain in left knee: Secondary | ICD-10-CM | POA: Diagnosis present

## 2017-05-22 ENCOUNTER — Encounter: Payer: Self-pay | Admitting: Family

## 2017-05-22 ENCOUNTER — Ambulatory Visit (INDEPENDENT_AMBULATORY_CARE_PROVIDER_SITE_OTHER): Payer: Medicare Other | Admitting: Family

## 2017-05-22 VITALS — BP 152/90 | HR 52 | Temp 97.5°F | Ht 65.0 in | Wt 312.0 lb

## 2017-05-22 DIAGNOSIS — F339 Major depressive disorder, recurrent, unspecified: Secondary | ICD-10-CM | POA: Diagnosis not present

## 2017-05-22 DIAGNOSIS — I10 Essential (primary) hypertension: Secondary | ICD-10-CM | POA: Diagnosis not present

## 2017-05-22 DIAGNOSIS — R001 Bradycardia, unspecified: Secondary | ICD-10-CM | POA: Insufficient documentation

## 2017-05-22 NOTE — Assessment & Plan Note (Signed)
Unchanged. Will wean off cymbalta. Trial of trazodone ( concerned with risk of Qt prolongation with SSRIs, SNRIs). Follow up 6 weeks.

## 2017-05-22 NOTE — Assessment & Plan Note (Signed)
Elevated today. As discussed with patient and patient's cardiologist, Dr. [name], we will start losartan if blood pressure does not come down today. Patient will monitor and let me know.

## 2017-05-22 NOTE — Addendum Note (Signed)
Addended by: [name] on: 05/22/2017 02:17 PM   Modules accepted: Orders

## 2017-05-22 NOTE — Patient Instructions (Addendum)
Trial taking 5mg  zebeta as opposed 10mg  to see if heart rate increases.  MONITOR blood pressure VERY CLOSELY.   Call me if doesn't come down today.  Monitor blood pressure,  Goal is less than 130/80; if persistently higher, please make sooner follow up appointment so we can recheck you blood pressure and manage medications  Titrate off of cymbalta since not working.   Start taking 60mg  cymbalta one day and the following take 120mg  ( usual dose) - do this for one week The following week, take 60mg  every day.  The third week, take 60mg  every OTHER day.  Then may stop as long as feel okay

## 2017-05-22 NOTE — Assessment & Plan Note (Addendum)
Symptomatic- dizzy.  Reassured by normal neurologic exam. EKG today shows bradycardia. No significant change from prior 04/2017. Faxed as well to Dr [name] for review. Discussed QT interval 506, whereas prior had been 498. Plan of  titrating off of Cymbalta to see if QT shortens. Patient also aware of this. We will decrease beta blocker, zebeta,  from 10 mg twice a day 5 mg twice a day and see if heart rate elevates. Patient will let me know how she is doing.

## 2017-05-22 NOTE — Progress Notes (Addendum)
Subjective:    Patient ID: Amber Barnes, female    DOB: 1950-09-02, 66 y.o.   MRN: [id number]  CC: Amber Barnes is a 66 y.o. female who presents today for follow up.   HPI: In the emergency room 10/21 for closed head injury. Describes bending over to pull stopped out of bathtub and got dizzy and lose balance and feel back of head. No LOC.   History of a fib- on eliquis; ranexa, noted bradycardia  RLS- started gabapentin at last visit, doing better.   Feels dizzy when gets out of chair and when turns head.  Denies syncope, exertional chest pain or pressure, numbness or tingling radiating to left arm or jaw, palpitations, frequent headaches, changes in vision, or shortness of breath.   Depression and anxiety-stage Cymbalta is not helping. Does have days where she feels depressed. Husband thinks she is quite anxious. No thoughts of hurting herself or anyone else. Had been on Zoloft the past however this did not work  Hospital doctor by weight.   05/01/15 CT head, cervical spine- no intracranial hemorrhage; spondylosis worse at c5-c6     HISTORY:  Past Medical History:  Diagnosis Date  . Achilles tendinitis   . Acute medial meniscal tear   . Allergy   . Anxiety   . Arthritis   . Atrial fibrillation ([place])    a. new onset 07/2016; b. CHADS2VASc --> 2 (HTN, female); c. eliquis  . Chronic lower back pain   . COPD (chronic obstructive pulmonary disease) ([place])   . Depression   . Fibrocystic breast disease   . GERD (gastroesophageal reflux disease)   . Hyperlipidemia   . Hypertension   . Insomnia   . Lipoma   . Osteoarthritis    left knee  . Osteopenia   . Tendonitis   . Thyroid disease   . Vitamin D deficiency    Past Surgical History:  Procedure Laterality Date  . ABDOMINAL HYSTERECTOMY     ovaries left  . ACHILLES TENDON SURGERY    . BACK SURGERY    . COLONOSCOPY    . ESOPHAGOGASTRODUODENOSCOPY    . THYROIDECTOMY     thyroid lobectomy secondary to  a benign nodule   Family History  Problem Relation Age of Onset  . Cancer Sister 71       ovarian  . Cancer Father 55       colon  . Heart disease Father   . Heart attack Father 29       died from MI  . Hyperlipidemia Father   . Hypertension Father   . Cancer Mother        leukemia  . Breast cancer Paternal Aunt        40's    Allergies: Prednisone; Hydrocodone-acetaminophen; Oxycodone; Adhesive [tape]; and Latex Current Outpatient Medications on File Prior to Visit  Medication Sig Dispense Refill  . amiodarone (PACERONE) 200 MG tablet TAKE 1 TABLET (200 MG TOTAL) BY MOUTH 2 (TWO) TIMES DAILY. 70 tablet 6  . B Complex Vitamins (VITAMIN B COMPLEX PO) Take 1 tablet by mouth daily.     . bisoprolol (ZEBETA) 10 MG tablet TAKE 1 TABLET (10 MG TOTAL) BY MOUTH 2 (TWO) TIMES DAILY. 60 tablet 3  . busPIRone (BUSPAR) 10 MG tablet TAKE 1 TABLET BY MOUTH TWICE A DAY 90 tablet 0  . calcium gluconate 500 MG tablet Take 500 tablets by mouth daily.    . Cholecalciferol (VITAMIN D3) 5000 units CAPS Take 5,000 Units  by mouth daily.    . diclofenac sodium (VOLTAREN) 1 % GEL Apply 2 g topically 4 (four) times daily. Rub into affected area of foot 2 to 4 times daily 100 g 2  . ELIQUIS 5 MG TABS tablet TAKE 1 TABLET BY MOUTH TWICE A DAY 60 tablet 3  . esomeprazole (NEXIUM) 20 MG capsule Take 20 mg by mouth daily as needed (acid reflux).    Marland Kitchen FLUCELVAX QUADRIVALENT 0.5 ML SUSY TO BE ADMINISTERED BY PHARMACIST FOR IMMUNIZATION  0  . furosemide (LASIX) 20 MG tablet Take 1 tablet (20 mg total) by mouth 2 (two) times daily. (Patient taking differently: Take 20 mg by mouth 2 (two) times daily. May take an additional 20 mgs as needed for swelling) 180 tablet 3  . gabapentin (NEURONTIN) 100 MG capsule Take 2 capsules (200 mg total) by mouth 2 (two) times daily. 90 capsule 2  . HYDROmorphone (DILAUDID) 2 MG tablet Take 1 tablet (2 mg total) by mouth every 6 (six) hours as needed for severe pain. 20 tablet 0  .  HYDROmorphone (DILAUDID) 2 MG tablet Take 1 tablet (2 mg total) by mouth every 6 (six) hours as needed for severe pain. 20 tablet 0  . Multiple Vitamin (MULTIVITAMIN) tablet Take 1 tablet by mouth daily.    Marland Kitchen oxymetazoline (AFRIN) 0.05 % nasal spray Place 1 spray into both nostrils at bedtime as needed for congestion.     . potassium chloride (KLOR-CON 10) 10 MEQ tablet Take 1 tablet (10 mEq total) by mouth 2 (two) times daily. 60 tablet 2  . RANEXA 500 MG 12 hr tablet TAKE 1 TABLET BY MOUTH TWICE A DAY 60 tablet 2  . rosuvastatin (CRESTOR) 20 MG tablet TAKE 1 TABLET BY MOUTH EVERY DAY 90 tablet 1  . sennosides-docusate sodium (SENOKOT-S) 8.6-50 MG tablet Take 1 tablet by mouth at bedtime as needed for constipation.     . [DISCONTINUED] omeprazole (PRILOSEC) 20 MG capsule Take 20 mg by mouth daily.     No current facility-administered medications on file prior to visit.     Social History   Tobacco Use  . Smoking status: Former Smoker    Packs/day: 0.25    Years: 10.00    Pack years: 2.50    Types: Cigarettes  . Smokeless tobacco: Former Systems developer    Quit date: 07/11/2001  . Tobacco comment: smoked for 10 years  Substance Use Topics  . Alcohol use: Yes    Comment: social, occasional  . Drug use: No    Review of Systems  Constitutional: Negative for chills and fever.  Eyes: Negative for visual disturbance.  Respiratory: Negative for cough.   Cardiovascular: Negative for chest pain and palpitations.  Gastrointestinal: Negative for nausea and vomiting.  Neurological: Positive for dizziness. Negative for syncope, weakness and headaches.      Objective:    BP (!) 152/90   Pulse (!) 52   Temp (!) 97.5 F (36.4 C) (Oral)   Ht 5\' 5"  (1.651 m)   Wt (!) 312 lb (141.5 kg)   SpO2 96%   BMI 51.92 kg/m  BP Readings from Last 3 Encounters:  05/22/17 (!) 152/90  04/30/17 (!) 190/86  04/13/17 140/80   Wt Readings from Last 3 Encounters:  05/22/17 (!) 312 lb (141.5 kg)  04/30/17 290 lb  (131.5 kg)  04/13/17 (!) 309 lb 6.4 oz (140.3 kg)    Physical Exam  Constitutional: She appears well-developed and well-nourished.  HENT:  Mouth/Throat: Uvula is  midline, oropharynx is clear and moist and mucous membranes are normal.  Eyes: Conjunctivae and EOM are normal. Pupils are equal, round, and reactive to light.  Fundus normal bilaterally.   Cardiovascular: Normal rate, regular rhythm, normal heart sounds and normal pulses.  Pulmonary/Chest: Effort normal and breath sounds normal. She has no wheezes. She has no rhonchi. She has no rales.  Neurological: She is alert. She has normal strength. No cranial nerve deficit or sensory deficit. She displays a negative Romberg sign.  Reflex Scores:      Bicep reflexes are 2+ on the right side and 2+ on the left side.      Patellar reflexes are 2+ on the right side and 2+ on the left side. Grip equal and strong bilateral upper extremities. Gait strong and steady. Able to perform rapid alternating movement without difficulty.   Skin: Skin is warm and dry.  Psychiatric: She has a normal mood and affect. Her speech is normal and behavior is normal. Thought content normal.  Vitals reviewed.      Assessment & Plan:   Problem List Items Addressed This Visit      Cardiovascular and Mediastinum   Essential hypertension    Elevated today. As discussed with patient and patient's cardiologist, Dr. [name], we will start losartan if blood pressure does not come down today. Patient will monitor and let me know.        Other   Bradycardia - Primary    Symptomatic- dizzy.  Reassured by normal neurologic exam. EKG today shows bradycardia. No significant change from prior 04/2017. Faxed as well to Dr [name] for review. Discussed QT interval 506, whereas prior had been 498. Plan of  titrating off of Cymbalta to see if QT shortens. Patient also aware of this. We will decrease beta blocker, zebeta,  from 10 mg twice a day 5 mg twice a day and see if heart  rate elevates. Patient will let me know how she is doing.      Relevant Orders   EKG 12-Lead (Completed)   Depression, recurrent (HCC)    Unchanged. Will wean off cymbalta. Trial of trazodone ( concerned with risk of Qt prolongation with SSRIs, SNRIs). Follow up 6 weeks.           I have discontinued Kischa Rosemond's DULoxetine. I am also having her maintain her multivitamin, B Complex Vitamins (VITAMIN B COMPLEX PO), furosemide, oxymetazoline, sennosides-docusate sodium, Vitamin D3, esomeprazole, calcium gluconate, HYDROmorphone, HYDROmorphone, diclofenac sodium, gabapentin, ELIQUIS, busPIRone, bisoprolol, rosuvastatin, potassium chloride, amiodarone, RANEXA, and FLUCELVAX QUADRIVALENT.   Meds ordered this encounter  Medications  . FLUCELVAX QUADRIVALENT 0.5 ML SUSY    Sig: TO BE ADMINISTERED BY PHARMACIST FOR IMMUNIZATION    Refill:  0    Return precautions given.   Risks, benefits, and alternatives of the medications and treatment plan prescribed today were discussed, and patient expressed understanding.   Education regarding symptom management and diagnosis given to patient on AVS.  Continue to follow with [name], FNP for routine health maintenance.   [name] and I agreed with plan.   [name], FNP

## 2017-05-22 NOTE — Progress Notes (Signed)
Pre visit review using our clinic review tool, if applicable. No additional management support is needed unless otherwise documented below in the visit note. 

## 2017-05-23 ENCOUNTER — Ambulatory Visit: Payer: Self-pay | Admitting: *Deleted

## 2017-05-23 NOTE — Telephone Encounter (Signed)
Pt   Was   Seen  Yesterday   For  Her bp   Problems   Had   Some  Med  Changes   And   She   States   Her  Pulse  Has  Been    Between    47-51   She  reports  Has  Been  Dizzy  As    Well    Pt  Wants to  Speak  To  Office   [place]   With patient on  Telephone    Reason for Disposition . Patient sounds very sick or weak to the triager  Answer Assessment - Initial Assessment Questions 1. DESCRIPTION: "Describe your dizziness."      lightheaded 2. LIGHTHEADED: "Do you feel lightheaded?" (e.g., somewhat faint, woozy, weak upon standing)     Yes    Woozy     3. VERTIGO: "Do you feel like either you or the room is spinning or tilting?" (i.e. vertigo)     no 4. SEVERITY: "How bad is it?"  "Do you feel like you are going to faint?" "Can you stand and walk?"   - MILD - walking normally   - MODERATE - interferes with normal activities (e.g., work, school)    - SEVERE - unable to stand, requires support to walk, feels like passing out now.        Moderate 5. ONSET:  "When did the dizziness begin?"       1   Week     6. AGGRAVATING FACTORS: "Does anything make it worse?" (e.g., standing, change in head position)       Worse   When  Stands  Or  Bends  Over   7. HEART RATE: "Can you tell me your heart rate?" "How many beats in 15 seconds?"  (Note: not all patients can do this)        Between  47-51  8. CAUSE: "What do you think is causing the dizziness?"       Recent  Change  In  Medications      Low  Pulse  Rate    9. RECURRENT SYMPTOM: "Have you had dizziness before?" If so, ask: "When was the last time?" "What happened that time?"    No 10. OTHER SYMPTOMS: "Do you have any other symptoms?" (e.g., fever, chest pain, vomiting, diarrhea, bleeding)       No  11. PREGNANCY: "Is there any chance you are pregnant?" "When was your last menstrual period?"       no  Protocols used: DIZZINESS [facility]

## 2017-06-05 ENCOUNTER — Other Ambulatory Visit: Payer: Self-pay | Admitting: Family

## 2017-06-05 NOTE — Telephone Encounter (Signed)
BASED ON NOTED FROM 05/22/17 VISIT with [place]:    Depression Unchanged. Will wean off cymbalta. Trial of trazodone ( concerned with risk of Qt prolongation with SSRIs, SNRIs). Follow up 6 weeks.   Trazodone not found on med list. Please advise on how to proceed.

## 2017-06-05 NOTE — Telephone Encounter (Signed)
Pt states that she thinks she has enough to finish the taper. She starts QOD tomorrow for a week & plans to stop. She will let us know if she has any issues.

## 2017-06-05 NOTE — Telephone Encounter (Signed)
Titrate off of cymbalta since not working (Nov 12 note from [place]) .   Start taking 60mg  cymbalta one day and the following take 120mg  ( usual dose) - do this for one week The following week, take 60mg  every day.  The third week, take 60mg  every OTHER day.  Then may stop as long as feel okay  WHERE IS SHE IN TERMS OF THIS TAPER (WHAT DOES SHE NEED)

## 2017-06-06 ENCOUNTER — Encounter: Payer: Medicare Other | Admitting: Podiatry

## 2017-06-07 ENCOUNTER — Encounter: Payer: Self-pay | Admitting: Family Medicine

## 2017-06-07 ENCOUNTER — Ambulatory Visit (INDEPENDENT_AMBULATORY_CARE_PROVIDER_SITE_OTHER): Payer: Medicare Other | Admitting: Family Medicine

## 2017-06-07 ENCOUNTER — Other Ambulatory Visit: Payer: Self-pay

## 2017-06-07 ENCOUNTER — Ambulatory Visit: Payer: Medicare Other | Admitting: Family Medicine

## 2017-06-07 ENCOUNTER — Ambulatory Visit: Payer: Self-pay | Admitting: *Deleted

## 2017-06-07 VITALS — BP 156/90 | HR 58 | Temp 97.6°F | Ht 65.0 in | Wt 313.5 lb

## 2017-06-07 DIAGNOSIS — G43901 Migraine, unspecified, not intractable, with status migrainosus: Secondary | ICD-10-CM | POA: Diagnosis not present

## 2017-06-07 DIAGNOSIS — I10 Essential (primary) hypertension: Secondary | ICD-10-CM | POA: Diagnosis not present

## 2017-06-07 DIAGNOSIS — R001 Bradycardia, unspecified: Secondary | ICD-10-CM

## 2017-06-07 MED ORDER — SUMATRIPTAN SUCCINATE 50 MG PO TABS: 50.0000 mg | ORAL_TABLET | ORAL | 0 refills | Status: DC | PRN

## 2017-06-07 MED ORDER — LOSARTAN POTASSIUM 25 MG PO TABS: 25.0000 mg | ORAL_TABLET | Freq: Every day | ORAL | 0 refills | Status: DC

## 2017-06-07 NOTE — Telephone Encounter (Signed)
I spoke with pt her head ache is the entire head and pain level is 10. Advised pt appt changed to 12 noon; Dr Lorelei Pont advised will see pt. Pt leaving now for appt. FYI to Dr Lorelei Pont.

## 2017-06-07 NOTE — Progress Notes (Signed)
Dr. Frederico Hamman T. Tiane Szydlowski, MD, [facility] [place], [address] Phone: [phone] Fax: [phone]  06/07/2017  Patient: Amber Barnes, MRN: [id number], DOB: [birth year], 66 y.o.  Primary Physician:  [name], FNP   Chief Complaint  Patient presents with  . Hypertension  . Headache    x 5 days   Subjective:   Amber Barnes is a 66 y.o. very pleasant female patient who presents with the following:  Acute migraine and high BP.  Went to the MD - saw [name]. Decreased bisoprolol to increase her pulse. BP needed to monitor it. Then started to get a headache for the last 5 days. Now started to get some dizziness in her L hand. Has tried some heat, ice, and does have history of migraines. Having some problems now with light and sound. Cannot read and focus much. Cannot sleep at night well. Has taken only tylenol.   Last migraine that she had was about 3 years ago, and the patient ultimately went to the hospital for that.  She does not recall if she is ever had a triptan in the past.  She currently does take Xarelto as well as amiodarone.  She currently is having problems with both light and sound.  Her blood pressures have been high at home, and her systolic numbers have been in the 150s-160s at home.  This is after she cut her bisoprolol dose in half.  Past Medical History, Surgical History, Social History, Family History, Problem List, Medications, and Allergies have been reviewed and updated if relevant.  Patient Active Problem List   Diagnosis Date Noted  . Bradycardia 05/22/2017  . Depression, recurrent ([place]) 05/22/2017  . Restless leg 04/13/2017  . OSA (obstructive sleep apnea) 01/09/2017  . Acute pain of left shoulder 11/28/2016  . A-fib ([place]) 11/28/2016  . Persistent atrial fibrillation ([place]) 08/11/2016  . Acute pulmonary edema ([facility])   . New onset a-fib ([place]) 07/28/2016  . Hypokalemia  07/28/2016  . Left foot pain 07/28/2016  . Atypical chest pain   . Atrial fibrillation with RVR ([place]) 07/27/2016  . Fatty liver 06/30/2016  . Dizziness 06/08/2016  . Family history of ovarian cancer 06/08/2016  . GERD (gastroesophageal reflux disease) 05/05/2016  . Seasonal rhinitis 05/05/2016  . Routine physical examination 05/05/2016  . Heel spur 11/05/2015  . Tendonitis 11/05/2015  . SOB (shortness of breath) 11/11/2014  . Morbid obesity ([place]) 11/11/2014  . Mixed hyperlipidemia 11/11/2014  . Essential hypertension 11/11/2014  . Anxiety and depression 11/11/2014  . Arthritis, senescent 11/11/2014  . Weight loss due to medication 11/11/2014  . Obesity, Class III, BMI 40-49.9 (morbid obesity) ([place]) 04/11/2013    Past Medical History:  Diagnosis Date  . Achilles tendinitis   . Acute medial meniscal tear   . Allergy   . Anxiety   . Arthritis   . Atrial fibrillation ([place])    a. new onset 07/2016; b. CHADS2VASc --> 2 (HTN, female); c. eliquis  . Chronic lower back pain   . COPD (chronic obstructive pulmonary disease) ([place])   . Depression   . Fibrocystic breast disease   . GERD (gastroesophageal reflux disease)   . Hyperlipidemia   . Hypertension   . Insomnia   . Lipoma   . Osteoarthritis    left knee  . Osteopenia   . Tendonitis   . Thyroid disease   . Vitamin D deficiency     Past Surgical History:  Procedure Laterality Date  . ABDOMINAL HYSTERECTOMY     ovaries left  . ACHILLES TENDON SURGERY    . BACK SURGERY    . CARDIOVERSION N/A 09/07/2016   Procedure: CARDIOVERSION;  Surgeon: Minna Merritts, MD;  Location: [facility] ORS;  Service: Cardiovascular;  Laterality: N/A;  . CARDIOVERSION N/A 10/19/2016   Procedure: CARDIOVERSION;  Surgeon: Minna Merritts, MD;  Location: [facility] ORS;  Service: Cardiovascular;  Laterality: N/A;  . CARDIOVERSION N/A 12/14/2016   Procedure: CARDIOVERSION;  Surgeon: Minna Merritts, MD;  Location: [facility] ORS;  Service: Cardiovascular;   Laterality: N/A;  . COLONOSCOPY    . ESOPHAGOGASTRODUODENOSCOPY    . THYROIDECTOMY     thyroid lobectomy secondary to a benign nodule    Social History   Socioeconomic History  . Marital status: Married    Spouse name: Not on file  . Number of children: Not on file  . Years of education: Not on file  . Highest education level: Not on file  Social Needs  . Financial resource strain: Not on file  . Food insecurity - worry: Not on file  . Food insecurity - inability: Not on file  . Transportation needs - medical: Not on file  . Transportation needs - non-medical: Not on file  Occupational History  . Not on file  Tobacco Use  . Smoking status: Former Smoker    Packs/day: 0.25    Years: 10.00    Pack years: 2.50    Types: Cigarettes  . Smokeless tobacco: Former Systems developer    Quit date: 07/11/2001  . Tobacco comment: smoked for 10 years  Substance and Sexual Activity  . Alcohol use: Yes    Comment: social, occasional  . Drug use: No  . Sexual activity: Not on file  Other Topics Concern  . Not on file  Social History Narrative   Moved to [place] from [place].       Grandmother.         Family History  Problem Relation Age of Onset  . Cancer Sister 65       ovarian  . Cancer Father 69       colon  . Heart disease Father   . Heart attack Father 41       died from MI  . Hyperlipidemia Father   . Hypertension Father   . Cancer Mother        leukemia  . Breast cancer Paternal Aunt        46's    Allergies  Allergen Reactions  . Prednisone Other (See Comments)    Agitation, hyperactivity, polyphagia  . Hydrocodone-Acetaminophen Other (See Comments)    Hallucinations and combative  . Oxycodone Itching  . Adhesive [Tape] Itching and Rash  . Latex Itching and Rash    use paper tape    Medication list reviewed and updated in full in [place].  GEN: No acute illnesses, no fevers, chills. GI: No n/v/d, eating normally Pulm: No SOB Interactive and  getting along well at home.  Otherwise, ROS is as per the HPI.  Objective:   BP (!) 156/90   Pulse (!) 58   Temp 97.6 F (36.4 C) (Oral)   Ht 5\' 5"  (1.651 m)   Wt (!) 313 lb 8 oz (142.2 kg)   BMI 52.17 kg/m   GEN: WDWN, NAD, Non-toxic, A & O x 3 HEENT: Atraumatic, Normocephalic. Neck supple. No masses, No LAD. Ears and Nose: No external deformity. CV: RRR, No  M/G/R. No JVD. No thrill. No extra heart sounds. PULM: CTA B, no wheezes, crackles, rhonchi. No retractions. No resp. distress. No accessory muscle use. EXTR: No c/c/e NEURO Normal gait.  PSYCH: Normally interactive. Conversant. Not depressed or anxious appearing.  Calm demeanor.   Neuro: CN 2-12 grossly intact. PERRLA. EOMI. Sensation intact throughout. Str 5/5 all extremities. DTR 2+. No clonus. A and o x 4. Romberg neg. Finger nose neg. Heel -shin neg.    Laboratory and Imaging Data:  Assessment and Plan:   Status migrainosus  Essential hypertension  Bradycardia  Significant hypertension after decreasing beta-blocker.  Decreased beta-blockade secondary to prior bradycardia.  I am going to add an angiotensin receptor blocker.  There is room to move up on this considerably.  Acute migraine.  May or may not have been mitigated by elevated blood pressures.  Neurologically intact.  Some limitations on medication secondary to comorbidities, and I am going to give her a mid potency triptan to try.  She has follow-up with cardiology in about 2 weeks.  Earlier with PCP if needed  Follow-up: No Follow-up on file.  Future Appointments  Date Time Provider [place]  06/19/2017  9:40 AM Gollan, [name], MD [facility]    Meds ordered this encounter  Medications  . losartan (COZAAR) 25 MG tablet    Sig: Take 1 tablet (25 mg total) by mouth daily.    Dispense:  30 tablet    Refill:  0  . SUMAtriptan (IMITREX) 50 MG tablet    Sig: Take 1 tablet (50 mg total) by mouth every 2 (two) hours as needed  for migraine. May repeat in 2 hours. Max 2/24 hours    Dispense:  10 tablet    Refill:  0   Signed,  Vishruth Seoane T. Miciah Covelli, MD   Allergies as of 06/07/2017      Reactions   Prednisone Other (See Comments)   Agitation, hyperactivity, polyphagia   Hydrocodone-acetaminophen Other (See Comments)   Hallucinations and combative   Oxycodone Itching   Adhesive [tape] Itching, Rash   Latex Itching, Rash   use paper tape      Medication List        Accurate as of 06/07/17  1:52 PM. Always use your most recent med list.          amiodarone 200 MG tablet Commonly known as:  PACERONE TAKE 1 TABLET (200 MG TOTAL) BY MOUTH 2 (TWO) TIMES DAILY.   bisoprolol 10 MG tablet Commonly known as:  ZEBETA TAKE 1 TABLET (10 MG TOTAL) BY MOUTH 2 (TWO) TIMES DAILY.   busPIRone 10 MG tablet Commonly known as:  BUSPAR TAKE 1 TABLET BY MOUTH TWICE A DAY   calcium gluconate 500 MG tablet Take 500 tablets by mouth daily.   diclofenac sodium 1 % Gel Commonly known as:  VOLTAREN Apply 2 g topically 4 (four) times daily. Rub into affected area of foot 2 to 4 times daily   ELIQUIS 5 MG Tabs tablet Generic drug:  apixaban TAKE 1 TABLET BY MOUTH TWICE A DAY   esomeprazole 20 MG capsule Commonly known as:  NEXIUM Take 20 mg by mouth daily as needed (acid reflux).   FLUCELVAX QUADRIVALENT 0.5 ML Susy Generic drug:  Influenza Vac Subunit Quad TO BE ADMINISTERED BY PHARMACIST FOR IMMUNIZATION   furosemide 20 MG tablet Commonly known as:  LASIX Take 1 tablet (20 mg total) by mouth 2 (two) times daily.   gabapentin 100 MG capsule Commonly known  as:  NEURONTIN Take 2 capsules (200 mg total) by mouth 2 (two) times daily.   HYDROmorphone 2 MG tablet Commonly known as:  DILAUDID Take 1 tablet (2 mg total) by mouth every 6 (six) hours as needed for severe pain.   HYDROmorphone 2 MG tablet Commonly known as:  DILAUDID Take 1 tablet (2 mg total) by mouth every 6 (six) hours as needed for severe  pain.   losartan 25 MG tablet Commonly known as:  COZAAR Take 1 tablet (25 mg total) by mouth daily.   multivitamin tablet Take 1 tablet by mouth daily.   oxymetazoline 0.05 % nasal spray Commonly known as:  AFRIN Place 1 spray into both nostrils at bedtime as needed for congestion.   potassium chloride 10 MEQ tablet Commonly known as:  KLOR-CON 10 Take 1 tablet (10 mEq total) by mouth 2 (two) times daily.   RANEXA 500 MG 12 hr tablet Generic drug:  ranolazine TAKE 1 TABLET BY MOUTH TWICE A DAY   rosuvastatin 20 MG tablet Commonly known as:  CRESTOR TAKE 1 TABLET BY MOUTH EVERY DAY   sennosides-docusate sodium 8.6-50 MG tablet Commonly known as:  SENOKOT-S Take 1 tablet by mouth at bedtime as needed for constipation.   SUMAtriptan 50 MG tablet Commonly known as:  IMITREX Take 1 tablet (50 mg total) by mouth every 2 (two) hours as needed for migraine. May repeat in 2 hours. Max 2/24 hours   VITAMIN B COMPLEX PO Take 1 tablet by mouth daily.   Vitamin D3 5000 units Caps Take 5,000 Units by mouth daily.

## 2017-06-07 NOTE — Telephone Encounter (Addendum)
Pt called with complaints of high blood pressure and a headache for the past 5 days and has nausea when she walks around; last BP 06/07/17 at 1046 right arm 182/95 HR 56; Pt has had changes to medications due to heart rate issues; pt states that she was instructed to contact MD's office if BP does not get better; spoke with Tye [place] at [facility] regarding this issue because no appointments are available pt notfied that because of her symptoms she needs to be seen at urgent care or ED; she refuses and opts to come to your office; Pt agrees to appointment with Dr Lorelei Pont [facility] at 1215; pt verbalizes understanding; spoke with Mearl Latin at [facility] regarding this upcoming appointment.   Reason for Disposition . Systolic BP  >= 201 OR Diastolic >= 007  Answer Assessment - Initial Assessment Questions 1. BLOOD PRESSURE: "What is the blood pressure?" "Did you take at least two measurements 5 minutes apart?"     Yes 182/95 2. ONSET: "When did you take your blood pressure?"     1045 on 06/07/17  3. HOW: "How did you obtain the blood pressure?" (e.g., visiting nurse, automatic home BP monitor)     Automatic cuff 4. HISTORY: "Do you have a history of high blood pressure?"     yes 5. MEDICATIONS: "Are you taking any medications for blood pressure?" "Have you missed any doses recently?"     Yes; has not missed doses 6. OTHER SYMPTOMS: "Do you have any symptoms?" (e.g., headache, chest pain, blurred vision, difficulty breathing, weakness)     Headache rated 10 out of 10 7. PREGNANCY: "Is there any chance you are pregnant?" "When was your last menstrual period?"     no  Protocols used: HIGH BLOOD PRESSURE-A-AH

## 2017-06-07 NOTE — Telephone Encounter (Signed)
I have evaluated.

## 2017-06-08 ENCOUNTER — Telehealth: Payer: Self-pay | Admitting: Family

## 2017-06-08 NOTE — Telephone Encounter (Signed)
Per patient Bisoprolol is on back order.  Is there a alternative medication she can use that can be called in for the time being. /

## 2017-06-08 NOTE — Telephone Encounter (Unsigned)
Copied from [place]. Topic: Quick Communication - See Telephone Encounter >> Jun 08, 2017 12:12 PM Hewitt Shorts wrote: CRM for notification. See Telephone encounter for: 06/08/17.pt states that the medication bisoprolol is on back order and not sure when it will be back so they are requesting that a different medication be called in   Best number [phone]

## 2017-06-09 ENCOUNTER — Telehealth: Payer: Self-pay | Admitting: Family Medicine

## 2017-06-09 ENCOUNTER — Other Ambulatory Visit: Payer: Self-pay

## 2017-06-09 MED ORDER — BUSPIRONE HCL 10 MG PO TABS: 10.0000 mg | ORAL_TABLET | Freq: Two times a day (BID) | ORAL | 0 refills | Status: DC

## 2017-06-09 NOTE — Telephone Encounter (Signed)
Copied from [place] [id number]. Topic: Quick Communication - See Telephone Encounter >> Jun 09, 2017  9:31 AM Ether Griffins B wrote: CRM for notification. See Telephone encounter for:  Saw Dr. Lorelei Pont 06/07/17 and the meds he prescribed are not helping margarines.   06/09/17.

## 2017-06-09 NOTE — Telephone Encounter (Signed)
Spoke with [facility], Bisoprolol and Buspirone are both on back order. I have sent in refill of Buspirone #180 no refills to [facility]. Advised patient to call Dr. [name] about the Bisoprolol since he is the MD who prescribed medication.

## 2017-06-09 NOTE — Telephone Encounter (Signed)
Reviewed note.  Pt of Amber Barnes.  Saw Dr Lorelei Pont recently for headache.  Please call pt and inform her that is she is continuing to have headache, etc, she needs to be reevaluated.

## 2017-06-09 NOTE — Telephone Encounter (Signed)
Copied from [place] [id number]. Topic: Quick Communication - See Telephone Encounter >> Jun 09, 2017  2:23 PM Cleaster Corin, [place] wrote: CRM for notification. See Telephone encounter for:   06/09/17. Saw Dr. Lorelei Pont 06/07/17 and the meds he prescribed are not helping margarines.  06/09/17.

## 2017-06-09 NOTE — Telephone Encounter (Signed)
Pt was seen 06/07/17 and med prescribed for migraine not helping. In the 06/07/17 note has f/u c cardiologist in 2 wk, earlier with PCP if needed.Please advise. Dr Lorelei Pont is out of office today and I saw [name] NP is out of office; I spoke with Beau Fanny at [place] office and was advised to send note to Dr Einar Pheasant.

## 2017-06-09 NOTE — Telephone Encounter (Signed)
This encounter was created in error - please disregard.

## 2017-06-09 NOTE — Telephone Encounter (Signed)
patient has been scheduled for Monday.

## 2017-06-11 ENCOUNTER — Other Ambulatory Visit: Payer: Self-pay | Admitting: Family

## 2017-06-11 DIAGNOSIS — G2581 Restless legs syndrome: Secondary | ICD-10-CM

## 2017-06-12 ENCOUNTER — Ambulatory Visit (INDEPENDENT_AMBULATORY_CARE_PROVIDER_SITE_OTHER): Payer: Medicare Other | Admitting: Family Medicine

## 2017-06-12 ENCOUNTER — Encounter: Payer: Self-pay | Admitting: Family Medicine

## 2017-06-12 VITALS — BP 144/92 | HR 62 | Temp 97.7°F | Ht 65.0 in | Wt 311.9 lb

## 2017-06-12 DIAGNOSIS — I10 Essential (primary) hypertension: Secondary | ICD-10-CM | POA: Diagnosis not present

## 2017-06-12 DIAGNOSIS — G43901 Migraine, unspecified, not intractable, with status migrainosus: Secondary | ICD-10-CM

## 2017-06-12 MED ORDER — SUMATRIPTAN 5 MG/ACT NA SOLN: 1.0000 | NASAL | 0 refills | Status: DC | PRN

## 2017-06-12 NOTE — Progress Notes (Signed)
Patient ID: Amber Barnes, female   DOB: Oct 30, 1950, 66 y.o.   MRN: [id number]  PCP: [name], FNP  Subjective:  Amber Barnes is a 66 y.o. year old very pleasant female patient who presents with a Headache  -started: 12 days ago, symptoms are not worsening -Aura: No -Pain: 8  Quality: throbbing Site: states over eyes but can be in the back as well  Radiation: No -Duration: 12 days -Trigger: possible trigger as finding out just prior to this HA that her husband has been diagnosed with MS. -Trauma: No -Stressors: Yes, recent diagnosis of her husband. She notes that stress has been associated as a trigger.  -Environmental Factors: No -Relation to Food/Alcohol: No  -Nausea/Vomiting: Nausea without vomiting -Fever: No -Weight Loss: No -History of CA/immunosuppresion: No -Visual Changes: No -Seizures: No  Recent evaluations for elevated blood pressure have been completed as patient has had home systolic averages in the 371I and 160s. She was started on losartan and BP has improved today after second evaluation. She denies chest pain, palpitations, SOB, numbness, tingling, weakness, headaches, or edema.    CT of head on 04/30/17 was completed after patient sustained a mechanical fall in the bathtub. CT was negative for skull fracture or intracranial hemorrhage. She denies that headache started at that time.   -previous treatments: imitrex, ibuprofen have provided limited benefit. She has also taken excedrin migraine which provided limited benefit. -sick contacts/travel/risks: No -Family Hx of headache: No  ROS-denies fever, chills, thunderclap HA, sinus pressure/pain, ear pain,   Pertinent Past Medical History- HTN, atrial fibrillation, and migraines  Medications- reviewed  Current Outpatient Medications  Medication Sig Dispense Refill  . amiodarone (PACERONE) 200 MG tablet TAKE 1 TABLET (200 MG TOTAL) BY MOUTH 2 (TWO) TIMES DAILY. 70 tablet 6  . B Complex Vitamins  (VITAMIN B COMPLEX PO) Take 1 tablet by mouth daily.     . bisoprolol (ZEBETA) 10 MG tablet TAKE 1 TABLET (10 MG TOTAL) BY MOUTH 2 (TWO) TIMES DAILY. 60 tablet 3  . busPIRone (BUSPAR) 10 MG tablet Take 1 tablet (10 mg total) by mouth 2 (two) times daily. 180 tablet 0  . calcium gluconate 500 MG tablet Take 500 tablets by mouth daily.    . Cholecalciferol (VITAMIN D3) 5000 units CAPS Take 5,000 Units by mouth daily.    . diclofenac sodium (VOLTAREN) 1 % GEL Apply 2 g topically 4 (four) times daily. Rub into affected area of foot 2 to 4 times daily 100 g 2  . ELIQUIS 5 MG TABS tablet TAKE 1 TABLET BY MOUTH TWICE A DAY 60 tablet 3  . esomeprazole (NEXIUM) 20 MG capsule Take 20 mg by mouth daily as needed (acid reflux).    Marland Kitchen FLUCELVAX QUADRIVALENT 0.5 ML SUSY TO BE ADMINISTERED BY PHARMACIST FOR IMMUNIZATION  0  . furosemide (LASIX) 20 MG tablet Take 1 tablet (20 mg total) by mouth 2 (two) times daily. (Patient taking differently: Take 20 mg by mouth 2 (two) times daily. May take an additional 20 mgs as needed for swelling) 180 tablet 3  . gabapentin (NEURONTIN) 100 MG capsule TAKE 2 CAPSULES BY MOUTH TWICE A DAY 90 capsule 2  . HYDROmorphone (DILAUDID) 2 MG tablet Take 1 tablet (2 mg total) by mouth every 6 (six) hours as needed for severe pain. 20 tablet 0  . HYDROmorphone (DILAUDID) 2 MG tablet Take 1 tablet (2 mg total) by mouth every 6 (six) hours as needed for severe pain. 20 tablet 0  .  losartan (COZAAR) 25 MG tablet Take 1 tablet (25 mg total) by mouth daily. 30 tablet 0  . Multiple Vitamin (MULTIVITAMIN) tablet Take 1 tablet by mouth daily.    Marland Kitchen oxymetazoline (AFRIN) 0.05 % nasal spray Place 1 spray into both nostrils at bedtime as needed for congestion.     . potassium chloride (KLOR-CON 10) 10 MEQ tablet Take 1 tablet (10 mEq total) by mouth 2 (two) times daily. 60 tablet 2  . RANEXA 500 MG 12 hr tablet TAKE 1 TABLET BY MOUTH TWICE A DAY 60 tablet 2  . rosuvastatin (CRESTOR) 20 MG tablet  TAKE 1 TABLET BY MOUTH EVERY DAY 90 tablet 1  . sennosides-docusate sodium (SENOKOT-S) 8.6-50 MG tablet Take 1 tablet by mouth at bedtime as needed for constipation.     . SUMAtriptan (IMITREX) 50 MG tablet Take 1 tablet (50 mg total) by mouth every 2 (two) hours as needed for migraine. May repeat in 2 hours. Max 2/24 hours 10 tablet 0   No current facility-administered medications for this visit.     Objective: BP (!) 144/96   Pulse 62   Temp 97.7 F (36.5 C) (Oral)   Ht 5\' 5"  (1.651 m)   Wt (!) 311 lb 13.9 oz (141.5 kg)   SpO2 96%   BMI 51.90 kg/m  Gen: NAD, resting comfortably HEENT: Oropharynx clear and moist CV: RRR no murmurs rubs or gallops Lungs: CTAB no crackles, wheeze, rhonchi Abdomen: soft/nontender/nondistended/normal bowel sounds. No rebound or guarding.  Ext: no edema Skin: warm, dry, no rash Neuro: II-Visual fields grossly intact. III/IV/VI-Extraocular movements intact. Pupils reactive bilaterally. V/VII-Smile symmetric, equal eyebrow raise, facial sensation intact VIII- Hearing grossly intact XI-bilateral shoulder shrug XII-midline tongue extension Motor: 5/5 bilaterally with normal tone and bulk Cerebellar: Normal finger-to-nose and normal heel-to-shin test.  Romberg negative Ambulates with a coordinated gait  Assessment/Plan:  Headache: History and exam today are suggestive of a migraine that may be triggered by recent stressor of husband's diagnosis. She reports that her history of migraines has been associated with stress in the past.  We discussed that exam and history are reassuring.  Neuro exam is normal.  As oral triptan has provided some but not full benefit. Will trial intranasal sumatriptan for less systemic effects due to patient's history of HTN and multiple medications that she takes daily.  We discussed that her medication options are limiting by HTN and treatments for HTN. Advised  close follow up if treatment does not improve symptoms.   Also, reviewed the importance of assessing for rebound HAs that can occur with 10 treatments/month.  2. Essential hypertension Retake of BP is 144/92. She has recently started losartan and is monitoring her blood pressure at home with a follow up appointment with cardiology in one week. Advised her to bring her blood pressure readings with her to her appointment. She is not interested in further work up today; she politely states that she is trying to have relief from her migraine.    Finally, we reviewed reasons to return to care including if symptoms worsen or persist or new concerns arise- once again particularly fever, stiff neck, vision changes, or worsening headache.     Laurita Quint, FNP

## 2017-06-12 NOTE — Progress Notes (Signed)
Pre visit review using our clinic review tool, if applicable. No additional management support is needed unless otherwise documented below in the visit note. 

## 2017-06-12 NOTE — Patient Instructions (Signed)
Please use medication that is a nasal spray and you can use on additional dose if headache is not improved. Also, if symptoms do not improve with treatment, worsen, or you have new symptoms please follow up with your provider.  A neurology consult can also be considered if this is a recurrent problem.   Migraine Headache A migraine headache is a very strong throbbing pain on one side or both sides of your head. Migraines can also cause other symptoms. Talk with your doctor about what things may bring on (trigger) your migraine headaches. Follow these instructions at home: Medicines  Take over-the-counter and prescription medicines only as told by your doctor.  Do not drive or use heavy machinery while taking prescription pain medicine.  To prevent or treat constipation while you are taking prescription pain medicine, your doctor may recommend that you: ? Drink enough fluid to keep your pee (urine) clear or pale yellow. ? Take over-the-counter or prescription medicines. ? Eat foods that are high in fiber. These include fresh fruits and vegetables, whole grains, and beans. ? Limit foods that are high in fat and processed sugars. These include fried and sweet foods. Lifestyle  Avoid alcohol.  Do not use any products that contain nicotine or tobacco, such as cigarettes and e-cigarettes. If you need help quitting, ask your doctor.  Get at least 8 hours of sleep every night.  Limit your stress. General instructions   Keep a journal to find out what may bring on your migraines. For example, write down: ? What you eat and drink. ? How much sleep you get. ? Any change in what you eat or drink. ? Any change in your medicines.  If you have a migraine: ? Avoid things that make your symptoms worse, such as bright lights. ? It may help to lie down in a dark, quiet room. ? Do not drive or use heavy machinery. ? Ask your doctor what activities are safe for you.  Keep all follow-up visits  as told by your doctor. This is important. Contact a doctor if:  You get a migraine that is different or worse than your usual migraines. Get help right away if:  Your migraine gets very bad.  You have a fever.  You have a stiff neck.  You have trouble seeing.  Your muscles feel weak or like you cannot control them.  You start to lose your balance a lot.  You start to have trouble walking.  You pass out (faint). This information is not intended to replace advice given to you by your health care provider. Make sure you discuss any questions you have with your health care provider. Document Released: 04/05/2008 Document Revised: 01/15/2016 Document Reviewed: 12/14/2015 Elsevier Interactive Patient Education  2017 [employer].

## 2017-06-13 ENCOUNTER — Other Ambulatory Visit: Payer: Self-pay | Admitting: Family

## 2017-06-19 ENCOUNTER — Ambulatory Visit: Payer: Medicare Other | Admitting: Cardiovascular Disease

## 2017-06-20 NOTE — Progress Notes (Deleted)
Cardiology Office Note  Date:  06/20/2017   ID:  Amber Barnes, DOB 08/03/1950, MRN [id number]  PCP:  [name], FNP   No chief complaint on file.   HPI:  [name] is a 66 year-old woman with history of  Persistent atrial fibrillation obesity,  arthritis,  hyperlipidemia,  hypertension,  Stress test January 2018 showing no ischemia, Ejection fraction 55-65% who presents for follow-up of her hyperlipidemia, hypertension, persistent atrial fibrillation  cardioversion procedure 09/07/2016 Normal sinus rhythm restored at that time Back into atrial fibrillation on last hospital visit March 13 Harder to walk,  SOB with exertion Weight up 10 pounds since 04/2016  Flecainide held, on amiodarone Repeat cardioversion 10/19/2016 Normal sinus rhythm restored  Recurrent atrial fib Seen by Dr. [name], Started on ranexa and continued on amiodarone She converted to normal sinus rhythm on her own, did not need DCCV  In follow-up today she is holding normal sinus rhythm Feels well no complaints Scheduled for ankle surgery in 1 month July 11  Takes lasix 1 to 2 a day amio BID  some fatigue  EKG personally reviewed by myself today showing  NSR rate 54  past medical history reviewed  In the hospital for foot surgery, this was delayed as she was found to be in atrial fibrillation Admitted to the hospital 07/27/2016 Started on diltiazem, beta blocker, anticoagulation  Back in hospsital 08/10/2016 Hospital records reviewed She was very anxious about palpitations Developed chest discomfort, tightness Medication doses were increased for better rate control Lasix in the emergency room for shortness of breath Also given DuoNeb's  Previous problems with sleep, Bad sleep, dreams  Total cholesterol 156, LDL 74, creatinine 0.69hematocrit 39  She did not tolerate Lipitor and cholesterol climbed up to 285, LDL 196 On Lipitor total cholesterol 168, LDL 88 in October  2015 No recent lipid panel available on Crestor She is scheduled to have repeat lab work at the end of May 2016 with primary care  She reports having a prior smoking history for 5 years, then smoked only small amounts  PMH:   has a past medical history of Achilles tendinitis, Acute medial meniscal tear, Allergy, Anxiety, Arthritis, Atrial fibrillation (HCC), Chronic lower back pain, COPD (chronic obstructive pulmonary disease) ([place]), Depression, Fibrocystic breast disease, GERD (gastroesophageal reflux disease), Hyperlipidemia, Hypertension, Insomnia, Lipoma, Osteoarthritis, Osteopenia, Tendonitis, Thyroid disease, and Vitamin D deficiency.  PSH:    Past Surgical History:  Procedure Laterality Date  . ABDOMINAL HYSTERECTOMY     ovaries left  . ACHILLES TENDON SURGERY    . BACK SURGERY    . CARDIOVERSION N/A 09/07/2016   Procedure: CARDIOVERSION;  Surgeon: Minna Merritts, MD;  Location: [facility] ORS;  Service: Cardiovascular;  Laterality: N/A;  . CARDIOVERSION N/A 10/19/2016   Procedure: CARDIOVERSION;  Surgeon: Minna Merritts, MD;  Location: [facility] ORS;  Service: Cardiovascular;  Laterality: N/A;  . CARDIOVERSION N/A 12/14/2016   Procedure: CARDIOVERSION;  Surgeon: Minna Merritts, MD;  Location: [facility] ORS;  Service: Cardiovascular;  Laterality: N/A;  . COLONOSCOPY    . ESOPHAGOGASTRODUODENOSCOPY    . THYROIDECTOMY     thyroid lobectomy secondary to a benign nodule    Current Outpatient Medications  Medication Sig Dispense Refill  . amiodarone (PACERONE) 200 MG tablet TAKE 1 TABLET (200 MG TOTAL) BY MOUTH 2 (TWO) TIMES DAILY. 70 tablet 6  . B Complex Vitamins (VITAMIN B COMPLEX PO) Take 1 tablet by mouth daily.     . bisoprolol (ZEBETA) 10 MG tablet  TAKE 1 TABLET (10 MG TOTAL) BY MOUTH 2 (TWO) TIMES DAILY. 60 tablet 3  . busPIRone (BUSPAR) 10 MG tablet Take 1 tablet (10 mg total) by mouth 2 (two) times daily. 180 tablet 0  . busPIRone (BUSPAR) 10 MG tablet TAKE 1 TABLET BY MOUTH TWICE A  DAY 90 tablet 0  . calcium gluconate 500 MG tablet Take 500 tablets by mouth daily.    . Cholecalciferol (VITAMIN D3) 5000 units CAPS Take 5,000 Units by mouth daily.    . diclofenac sodium (VOLTAREN) 1 % GEL Apply 2 g topically 4 (four) times daily. Rub into affected area of foot 2 to 4 times daily 100 g 2  . ELIQUIS 5 MG TABS tablet TAKE 1 TABLET BY MOUTH TWICE A DAY 60 tablet 3  . esomeprazole (NEXIUM) 20 MG capsule Take 20 mg by mouth daily as needed (acid reflux).    Marland Kitchen FLUCELVAX QUADRIVALENT 0.5 ML SUSY TO BE ADMINISTERED BY PHARMACIST FOR IMMUNIZATION  0  . furosemide (LASIX) 20 MG tablet Take 1 tablet (20 mg total) by mouth 2 (two) times daily. (Patient taking differently: Take 20 mg by mouth 2 (two) times daily. May take an additional 20 mgs as needed for swelling) 180 tablet 3  . gabapentin (NEURONTIN) 100 MG capsule TAKE 2 CAPSULES BY MOUTH TWICE A DAY 90 capsule 2  . HYDROmorphone (DILAUDID) 2 MG tablet Take 1 tablet (2 mg total) by mouth every 6 (six) hours as needed for severe pain. 20 tablet 0  . HYDROmorphone (DILAUDID) 2 MG tablet Take 1 tablet (2 mg total) by mouth every 6 (six) hours as needed for severe pain. 20 tablet 0  . losartan (COZAAR) 25 MG tablet Take 1 tablet (25 mg total) by mouth daily. 30 tablet 0  . Multiple Vitamin (MULTIVITAMIN) tablet Take 1 tablet by mouth daily.    Marland Kitchen oxymetazoline (AFRIN) 0.05 % nasal spray Place 1 spray into both nostrils at bedtime as needed for congestion.     . potassium chloride (KLOR-CON 10) 10 MEQ tablet Take 1 tablet (10 mEq total) by mouth 2 (two) times daily. 60 tablet 2  . RANEXA 500 MG 12 hr tablet TAKE 1 TABLET BY MOUTH TWICE A DAY 60 tablet 2  . rosuvastatin (CRESTOR) 20 MG tablet TAKE 1 TABLET BY MOUTH EVERY DAY 90 tablet 1  . sennosides-docusate sodium (SENOKOT-S) 8.6-50 MG tablet Take 1 tablet by mouth at bedtime as needed for constipation.     . SUMAtriptan (IMITREX) 5 MG/ACT nasal spray Place 1 spray (5 mg total) into the nose  every 2 (two) hours as needed for migraine. 1 Inhaler 0  . SUMAtriptan (IMITREX) 50 MG tablet Take 1 tablet (50 mg total) by mouth every 2 (two) hours as needed for migraine. May repeat in 2 hours. Max 2/24 hours 10 tablet 0   No current facility-administered medications for this visit.      Allergies:   Prednisone; Hydrocodone-acetaminophen; Oxycodone; Adhesive [tape]; and Latex   Social History:  The patient  reports that she has quit smoking. Her smoking use included cigarettes. She has a 2.50 pack-year smoking history. She quit smokeless tobacco use about 15 years ago. She reports that she drinks alcohol. She reports that she does not use drugs.   Family History:   family history includes Breast cancer in her paternal aunt; Cancer in her mother; Cancer (age of onset: 67) in her sister; Cancer (age of onset: 57) in her father; Heart attack (age of onset:  26) in her father; Heart disease in her father; Hyperlipidemia in her father; Hypertension in her father.    Review of Systems: Review of Systems  Constitutional: Negative.   Respiratory: Negative.   Cardiovascular: Negative.   Gastrointestinal: Negative.   Musculoskeletal: Negative.   Psychiatric/Behavioral: The patient does not have insomnia ( none).   All other systems reviewed and are negative.    PHYSICAL EXAM: VS:  There were no vitals taken for this visit. , BMI There is no height or weight on file to calculate BMI. GEN: Well nourished, well developed, in no acute distress , Morbid obese  HEENT: normal  Neck: no JVD, carotid bruits, or masses Cardiac:RRR,  no murmurs, rubs, or gallops,no edema  Respiratory:  clear to auscultation bilaterally, normal work of breathing GI: soft, nontender, nondistended, + BS MS: no deformity or atrophy  Skin: warm and dry, no rash Neuro:  Strength and sensation are intact Psych: euthymic mood, full affect     Recent Labs: 07/28/2016: TSH 2.373 08/10/2016: B Natriuretic Peptide  278.0 11/29/2016: Magnesium 2.1 04/13/2017: ALT 23 04/30/2017: BUN 19; Creatinine, Ser 0.78; Hemoglobin 13.6; Platelets 196; Potassium 4.1; Sodium 139    Lipid Panel Lab Results  Component Value Date   CHOL 126 08/11/2016   HDL 45 08/11/2016   LDLCALC 65 08/11/2016   TRIG 82 08/11/2016      Wt Readings from Last 3 Encounters:  06/12/17 (!) 311 lb 13.9 oz (141.5 kg)  06/07/17 (!) 313 lb 8 oz (142.2 kg)  05/22/17 (!) 312 lb (141.5 kg)       ASSESSMENT AND PLAN:  Preop cardiovascular Recommended she stop the anticoagulation 2 days prior to her ankle surgery in July 2018  No medication changes  Mixed hyperlipidemia Cholesterol is at goal on the current lipid regimen. No changes to the medications were made.  Essential hypertension Blood pressure is well controlled on today's visit. No changes made to the medications.  Atrial fibrillation with RVR ([place]) She will stay on the same medications, Recommended she decrease the amiodarone down to once a day after her ankle surgery Stay on ranexa  SOB (shortness of breath) Improved shortness of breath with restoring normal sinus rhythm   Morbid obesity ([place]) Despite her efforts, she is unable to lose weight Recommended a strict low carbohydrate diet  Acute pulmonary edema ([place]) She is taking Lasix daily, sometimes twice a day  Suspect she may be able to wean off the Lasix now that she is back in normal sinus rhythm   Total encounter time more than 15 minutes  Greater than 50% was spent in counseling and coordination of care with the patient   Disposition:   F/U  6 months   No orders of the defined types were placed in this encounter.    Signed, Esmond Plants, M.D., Ph.D. 06/20/2017  [place], [place]

## 2017-06-21 ENCOUNTER — Ambulatory Visit: Payer: Medicare Other | Admitting: Cardiovascular Disease

## 2017-06-26 ENCOUNTER — Emergency Department: Payer: Medicare Other

## 2017-06-26 ENCOUNTER — Encounter: Payer: Self-pay | Admitting: Radiology

## 2017-06-26 ENCOUNTER — Emergency Department
Admission: EM | Admit: 2017-06-26 | Discharge: 2017-06-26 | Disposition: A | Discharge status: Home / Self Care | Payer: Medicare Other | Attending: Emergency Medicine | Admitting: Emergency Medicine

## 2017-06-26 DIAGNOSIS — F419 Anxiety disorder, unspecified: Secondary | ICD-10-CM | POA: Insufficient documentation

## 2017-06-26 DIAGNOSIS — Z79899 Other long term (current) drug therapy: Secondary | ICD-10-CM | POA: Diagnosis not present

## 2017-06-26 DIAGNOSIS — J449 Chronic obstructive pulmonary disease, unspecified: Secondary | ICD-10-CM | POA: Insufficient documentation

## 2017-06-26 DIAGNOSIS — I10 Essential (primary) hypertension: Secondary | ICD-10-CM | POA: Diagnosis not present

## 2017-06-26 DIAGNOSIS — R079 Chest pain, unspecified: Secondary | ICD-10-CM | POA: Diagnosis present

## 2017-06-26 DIAGNOSIS — Z91040 Latex allergy status: Secondary | ICD-10-CM | POA: Insufficient documentation

## 2017-06-26 DIAGNOSIS — Z87891 Personal history of nicotine dependence: Secondary | ICD-10-CM | POA: Diagnosis not present

## 2017-06-26 DIAGNOSIS — Z7901 Long term (current) use of anticoagulants: Secondary | ICD-10-CM | POA: Insufficient documentation

## 2017-06-26 DIAGNOSIS — R0602 Shortness of breath: Secondary | ICD-10-CM | POA: Diagnosis not present

## 2017-06-26 HISTORY — DX: Heart failure, unspecified: I50.9

## 2017-06-26 LAB — BASIC METABOLIC PANEL
Anion gap: 8 (ref 5–15)
BUN: 17 mg/dL (ref 6–20)
CO2: 25 mmol/L (ref 22–32)
Calcium: 8.8 mg/dL — ABNORMAL LOW (ref 8.9–10.3)
Chloride: 106 mmol/L (ref 101–111)
Creatinine, Ser: 0.76 mg/dL (ref 0.44–1.00)
GFR calc Af Amer: >60 mL/min (ref >60)
GFR calc non Af Amer: >60 mL/min (ref >60)
Glucose, Bld: 104 mg/dL — ABNORMAL HIGH (ref 65–99)
Potassium: 3.9 mmol/L (ref 3.5–5.1)
Sodium: 139 mmol/L (ref 135–145)

## 2017-06-26 LAB — CBC
HCT: 39.6 % (ref 35.0–47.0)
Hemoglobin: 13.1 g/dL (ref 12.0–16.0)
MCH: 29.8 pg (ref 26.0–34.0)
MCHC: 33.1 g/dL (ref 32.0–36.0)
MCV: 90.2 fL (ref 80.0–100.0)
Platelets: 218 10*3/uL (ref 150–440)
RBC: 4.39 MIL/uL (ref 3.80–5.20)
RDW: 14.5 % (ref 11.5–14.5)
WBC: 5.1 10*3/uL (ref 3.6–11.0)

## 2017-06-26 LAB — TROPONIN I
Troponin I: <0.03 ng/mL (ref <0.03)
Troponin I: <0.03 ng/mL (ref <0.03)

## 2017-06-26 LAB — TSH: TSH: 5.678 u[IU]/mL — ABNORMAL HIGH (ref 0.350–4.500)

## 2017-06-26 LAB — FIBRIN DERIVATIVES D-DIMER (ARMC ONLY): Fibrin derivatives D-dimer (ARMC): 745.26 ng/mL (FEU) — ABNORMAL HIGH (ref 0.00–499.00)

## 2017-06-26 MED ORDER — LORAZEPAM 1 MG PO TABS: 1.0000 mg | ORAL_TABLET | Freq: Three times a day (TID) | ORAL | 0 refills | Status: DC | PRN

## 2017-06-26 MED ORDER — LORAZEPAM 1 MG PO TABS
1.0000 mg | ORAL_TABLET | Freq: Once | ORAL | Status: AC
Start: 2017-06-26 — End: 2017-06-26
  Administered 2017-06-26: 1 mg via ORAL
  Filled 2017-06-26: qty 1

## 2017-06-26 MED ORDER — IOPAMIDOL (ISOVUE-370) INJECTION 76%
75.0000 mL | Freq: Once | INTRAVENOUS | Status: AC | PRN
  Administered 2017-06-26: 75 mL via INTRAVENOUS

## 2017-06-26 NOTE — ED Notes (Signed)
Unable to print yellow lab label, lab  Notified and is ok to use chart label per Luna Fuse.

## 2017-06-26 NOTE — ED Provider Notes (Signed)
[facility] Emergency Department Provider Note   First MD Initiated Contact with Patient 06/26/17 [phone]     (approximate)  I have reviewed the triage vital signs and the nursing notes.   HISTORY  Chief Complaint Chest Pain and Shortness of Breath   HPI Amber Barnes is a 66 y.o. female presents to the emergency department with a 3 week history of feeling anxious short of breath and "feeling like my chest is going to explode". Patient very tearful on my arrival to room. Patient states that she's had episodes where she cries without any known provoking cause. Patient states that her primary care father discontinued her anti-anxiety/depression medication 3 weeks ago secondary to "slow heart rate". Patient denies any pain at present. Patient however very tearful. Patient denies any auditory pain or swelling. Patient denies any history of DVT or PE.   Past Medical History:  Diagnosis Date  . Achilles tendinitis   . Acute medial meniscal tear   . Allergy   . Anxiety   . Arthritis   . Atrial fibrillation ([place])    a. new onset 07/2016; b. CHADS2VASc --> 2 (HTN, female); c. eliquis  . Chronic lower back pain   . COPD (chronic obstructive pulmonary disease) ([place])   . Depression   . Fibrocystic breast disease   . GERD (gastroesophageal reflux disease)   . Hyperlipidemia   . Hypertension   . Insomnia   . Lipoma   . Osteoarthritis    left knee  . Osteopenia   . Tendonitis   . Thyroid disease   . Vitamin D deficiency     Patient Active Problem List   Diagnosis Date Noted  . Bradycardia 05/22/2017  . Depression, recurrent ([place]) 05/22/2017  . Restless leg 04/13/2017  . OSA (obstructive sleep apnea) 01/09/2017  . Acute pain of left shoulder 11/28/2016  . Persistent atrial fibrillation ([place]) 08/11/2016  . Acute pulmonary edema (HCC)   . New onset a-fib ([place]) 07/28/2016  . Hypokalemia 07/28/2016  . Left foot pain 07/28/2016  . Atypical chest pain   .  Fatty liver 06/30/2016  . Dizziness 06/08/2016  . Family history of ovarian cancer 06/08/2016  . GERD (gastroesophageal reflux disease) 05/05/2016  . Seasonal rhinitis 05/05/2016  . Routine physical examination 05/05/2016  . Heel spur 11/05/2015  . Tendonitis 11/05/2015  . SOB (shortness of breath) 11/11/2014  . Morbid obesity ([place]) 11/11/2014  . Mixed hyperlipidemia 11/11/2014  . Essential hypertension 11/11/2014  . Anxiety and depression 11/11/2014  . Arthritis, senescent 11/11/2014  . Weight loss due to medication 11/11/2014  . Obesity, Class III, BMI 40-49.9 (morbid obesity) ([place]) 04/11/2013    Past Surgical History:  Procedure Laterality Date  . ABDOMINAL HYSTERECTOMY     ovaries left  . ACHILLES TENDON SURGERY    . BACK SURGERY    . CARDIOVERSION N/A 09/07/2016   Procedure: CARDIOVERSION;  Surgeon: Minna Merritts, MD;  Location: [facility] ORS;  Service: Cardiovascular;  Laterality: N/A;  . CARDIOVERSION N/A 10/19/2016   Procedure: CARDIOVERSION;  Surgeon: Minna Merritts, MD;  Location: [facility] ORS;  Service: Cardiovascular;  Laterality: N/A;  . CARDIOVERSION N/A 12/14/2016   Procedure: CARDIOVERSION;  Surgeon: Minna Merritts, MD;  Location: [facility] ORS;  Service: Cardiovascular;  Laterality: N/A;  . COLONOSCOPY    . ESOPHAGOGASTRODUODENOSCOPY    . THYROIDECTOMY     thyroid lobectomy secondary to a benign nodule    Prior to Admission medications   Medication Sig Start Date End  Date Taking? Authorizing Provider  amiodarone (PACERONE) 200 MG tablet TAKE 1 TABLET (200 MG TOTAL) BY MOUTH 2 (TWO) TIMES DAILY. 05/15/17   Minna Merritts, MD  B Complex Vitamins (VITAMIN B COMPLEX PO) Take 1 tablet by mouth daily.     [provider]  bisoprolol (ZEBETA) 10 MG tablet TAKE 1 TABLET (10 MG TOTAL) BY MOUTH 2 (TWO) TIMES DAILY. 05/01/17   Minna Merritts, MD  busPIRone (BUSPAR) 10 MG tablet Take 1 tablet (10 mg total) by mouth 2 (two) times daily. 06/09/17   [name],  FNP  busPIRone (BUSPAR) 10 MG tablet TAKE 1 TABLET BY MOUTH TWICE A DAY 06/13/17   [name], FNP  calcium gluconate 500 MG tablet Take 500 tablets by mouth daily.    [provider]  Cholecalciferol (VITAMIN D3) 5000 units CAPS Take 5,000 Units by mouth daily.    [provider]  diclofenac sodium (VOLTAREN) 1 % GEL Apply 2 g topically 4 (four) times daily. Rub into affected area of foot 2 to 4 times daily 03/27/17   Trula Slade, DPM  ELIQUIS 5 MG TABS tablet TAKE 1 TABLET BY MOUTH TWICE A DAY 04/17/17   Minna Merritts, MD  esomeprazole (NEXIUM) 20 MG capsule Take 20 mg by mouth daily as needed (acid reflux).    [provider]  FLUCELVAX QUADRIVALENT 0.5 ML SUSY TO BE ADMINISTERED BY PHARMACIST FOR IMMUNIZATION 04/03/17   [provider]  furosemide (LASIX) 20 MG tablet Take 1 tablet (20 mg total) by mouth 2 (two) times daily. Patient taking differently: Take 20 mg by mouth 2 (two) times daily. May take an additional 20 mgs as needed for swelling 09/12/16   Minna Merritts, MD  gabapentin (NEURONTIN) 100 MG capsule TAKE 2 CAPSULES BY MOUTH TWICE A DAY 06/12/17   [name], FNP  HYDROmorphone (DILAUDID) 2 MG tablet Take 1 tablet (2 mg total) by mouth every 6 (six) hours as needed for severe pain. 01/23/17   Trula Slade, DPM  HYDROmorphone (DILAUDID) 2 MG tablet Take 1 tablet (2 mg total) by mouth every 6 (six) hours as needed for severe pain. 02/06/17   Trula Slade, DPM  losartan (COZAAR) 25 MG tablet Take 1 tablet (25 mg total) by mouth daily. 06/07/17   Copland, Frederico Hamman, MD  Multiple Vitamin (MULTIVITAMIN) tablet Take 1 tablet by mouth daily.    [provider]  oxymetazoline (AFRIN) 0.05 % nasal spray Place 1 spray into both nostrils at bedtime as needed for congestion.     [provider]  potassium chloride (KLOR-CON 10) 10 MEQ tablet Take 1 tablet (10 mEq total) by mouth 2 (two) times daily. 05/10/17    Gollan, [name], MD  RANEXA 500 MG 12 hr tablet TAKE 1 TABLET BY MOUTH TWICE A DAY 05/15/17   Minna Merritts, MD  rosuvastatin (CRESTOR) 20 MG tablet TAKE 1 TABLET BY MOUTH EVERY DAY 05/03/17   [name], FNP  sennosides-docusate sodium (SENOKOT-S) 8.6-50 MG tablet Take 1 tablet by mouth at bedtime as needed for constipation.     [provider]  SUMAtriptan (IMITREX) 5 MG/ACT nasal spray Place 1 spray (5 mg total) into the nose every 2 (two) hours as needed for migraine. 06/12/17   Delano Metz, FNP  SUMAtriptan (IMITREX) 50 MG tablet Take 1 tablet (50 mg total) by mouth every 2 (two) hours as needed for migraine. May repeat in 2 hours. Max 2/24  hours 06/07/17   Owens Loffler, MD    Allergies Prednisone; Hydrocodone-acetaminophen; Oxycodone; Adhesive [tape]; and Latex  Family History  Problem Relation Age of Onset  . Cancer Sister 36       ovarian  . Cancer Father 91       colon  . Heart disease Father   . Heart attack Father 46       died from MI  . Hyperlipidemia Father   . Hypertension Father   . Cancer Mother        leukemia  . Breast cancer Paternal Aunt        51's    Social History Social History   Tobacco Use  . Smoking status: Former Smoker    Packs/day: 0.25    Years: 10.00    Pack years: 2.50    Types: Cigarettes  . Smokeless tobacco: Former Systems developer    Quit date: 07/11/2001  . Tobacco comment: smoked for 10 years  Substance Use Topics  . Alcohol use: Yes    Comment: social, occasional  . Drug use: No     Constitutional: No fever/chills Eyes: No visual changes. ENT: No sore throat. Cardiovascular: Positive for chest pain. Respiratory: Positive for shortness of breath. Gastrointestinal: No abdominal pain.  No nausea, no vomiting.  No diarrhea.  No constipation. Genitourinary: Negative for dysuria. Musculoskeletal: Negative for neck pain.  Negative for back pain. Integumentary: Negative for rash. Neurological: Negative for  headaches, focal weakness or numbness. Psychiatry: Positive for anxiety and depression  ____________________________________________   PHYSICAL EXAM:  VITAL SIGNS: ED Triage Vitals  Enc Vitals Group     BP 06/26/17 0642 (!) 178/73     Pulse Rate 06/26/17 0642 (!) 50     Resp 06/26/17 0642 20     Temp 06/26/17 0642 97.8 F (36.6 C)     Temp Source 06/26/17 0642 Oral     SpO2 06/26/17 0642 97 %     Weight 06/26/17 0642 131.5 kg (290 lb)     Height 06/26/17 0642 1.651 m (5\' 5" )     Head Circumference --      Peak Flow --      Pain Score 06/26/17 0641 0     Pain Loc --      Pain Edu? --      Excl. in [place]? --     Constitutional: Alert and oriented. Well appearing and in no acute distress. Eyes: Conjunctivae are normal. PERRL. EOMI. Head: Atraumatic. Mouth/Throat: Mucous membranes are moist.  Oropharynx non-erythematous. Neck: No stridor.   Cardiovascular: Normal rate, regular rhythm. Good peripheral circulation. Grossly normal heart sounds. Respiratory: Normal respiratory effort.  No retractions. Lungs CTAB. Gastrointestinal: Soft and nontender. No distention.  Musculoskeletal: No lower extremity tenderness nor edema. No gross deformities of extremities. Neurologic:  Normal speech and language. No gross focal neurologic deficits are appreciated.  Skin:  Skin is warm, dry and intact. No rash noted. Psychiatric: Mood and affect are normal. Speech and behavior are normal.  ____________________________________________   LABS (all labs ordered are listed, but only abnormal results are displayed)  Labs Reviewed  BASIC METABOLIC PANEL - Abnormal; Notable for the following components:      Result Value   Glucose, Bld 104 (*)    Calcium 8.8 (*)    All other components within normal limits  FIBRIN DERIVATIVES D-DIMER ([facility] ONLY) - Abnormal; Notable for the following components:   Fibrin derivatives D-dimer [facility]) 745.26 (*)    All other components  within normal limits  TSH -  Abnormal; Notable for the following components:   TSH 5.678 (*)    All other components within normal limits  CBC  TROPONIN I   ____________________________________________  EKG ED ECG REPORT I, [place] N BROWN, the attending physician, personally viewed and interpreted this ECG.   Date: 06/26/2017  EKG Time: 6:35 AM  Rate: 52  Rhythm: Sinus bradycardia  Axis: Normal  Intervals: Normal  ST&T Change: None  ____________________________________________  RADIOLOGY I, [place] N BROWN, personally viewed and evaluated these images (plain radiographs) as part of my medical decision making, as well as reviewing the written report by the radiologist.  Dg Chest 2 View  Result Date: 06/26/2017 CLINICAL DATA:  Shortness of breath and expansile pressure sensation within the chest. Also anxiety. History of COPD, hypertension, atrial fibrillation, former smoker. EXAM: CHEST  2 VIEW COMPARISON:  Chest x-ray of April 30, 2017. FINDINGS: The lungs are adequately inflated. The interstitial markings are mildly prominent. There is no alveolar infiltrate or pleural effusion. The heart is top-normal in size. The pulmonary vascularity is normal. The mediastinum is normal in width. There is calcification in the wall of the aortic arch. The bony thorax exhibits no acute abnormality. IMPRESSION: Top-normal cardiac size without significant pulmonary vascular congestion. Mild interstitial prominence may reflect low-grade interstitial edema or interstitial change related the patient's previous smoking history. Thoracic aortic atherosclerosis. Electronically Signed   By: David  Martinique M.D.   On: 06/26/2017 07:28      Procedures   ____________________________________________   INITIAL IMPRESSION / ASSESSMENT AND PLAN / ED COURSE  As part of my medical decision making, I reviewed the following data within the electronic MEDICAL RECORD NUMBER 66 year old female presenting above stated history of physical exam.  Patient clearly very anxious question if patient's symptoms of been provoked secondary to anxiety since the patient's medication has been discontinued recently. Patient given 1 mg by mouth Ativan after my evaluation. On reevaluation all symptoms resolved patient no longer crying stating that she feels better. Laboratory data unremarkable including troponin 2 d-dimer elevated as such CT scan of chest was performed which revealed no evidence of pulmonary emboli EKG revealed no evidence of ischemia or infarction. Patient is advised to follow-up with primary care provider tomorrow for further outpatient evaluation and management. ____________________________________________  FINAL CLINICAL IMPRESSION(S) / ED DIAGNOSES  Final diagnoses:  Chest pain, unspecified type  Anxiety   MEDICATIONS GIVEN DURING THIS VISIT:  Medications  LORazepam (ATIVAN) tablet 1 mg (1 mg Oral Given 06/26/17 0935)     ED Discharge Orders    None       Note:  This document was prepared using Dragon voice recognition software and may include unintentional dictation errors.    Gregor Hams, MD 06/26/17 [phone]

## 2017-06-26 NOTE — ED Notes (Signed)
CT updated that IV was in place.

## 2017-06-26 NOTE — ED Notes (Signed)
Pt resting at this time, states ativan helped her relax. Continue to monitor.

## 2017-06-26 NOTE — ED Notes (Signed)
Pt given diet coke and saltine crackers ok per edp.

## 2017-06-26 NOTE — ED Triage Notes (Signed)
Pt states she has had several weeks of "feeling like my chest is going to explode and I feel short of breath." pt is very tearful and appears anxious. Pt states she has been crying frequently "over nothing". Pt states she was recently taken off several medications due to bradycardia.

## 2017-06-26 NOTE — ED Notes (Addendum)
Pt very tearful when placed in room. Pt was taken off her depression meds due to lower heart rate. Pt was not placed on any  New meds as of yet. Pt appears very anxious and tearful. States she feels like she can't breathe at times.  Pt states she cannot stop crying, husband was  Just dx with MS. Pt states she also has bad dreams that wake up with the feelings of not being able to breathe.

## 2017-06-27 ENCOUNTER — Encounter: Payer: Self-pay | Admitting: Family Medicine

## 2017-06-27 ENCOUNTER — Ambulatory Visit: Payer: Self-pay

## 2017-06-27 ENCOUNTER — Ambulatory Visit (INDEPENDENT_AMBULATORY_CARE_PROVIDER_SITE_OTHER): Payer: Medicare Other | Admitting: Family Medicine

## 2017-06-27 VITALS — BP 136/78 | HR 56 | Temp 97.6°F | Wt 313.8 lb

## 2017-06-27 DIAGNOSIS — G4733 Obstructive sleep apnea (adult) (pediatric): Secondary | ICD-10-CM

## 2017-06-27 DIAGNOSIS — G43109 Migraine with aura, not intractable, without status migrainosus: Secondary | ICD-10-CM | POA: Diagnosis not present

## 2017-06-27 DIAGNOSIS — F419 Anxiety disorder, unspecified: Secondary | ICD-10-CM

## 2017-06-27 DIAGNOSIS — F329 Major depressive disorder, single episode, unspecified: Secondary | ICD-10-CM

## 2017-06-27 DIAGNOSIS — F32A Depression, unspecified: Secondary | ICD-10-CM

## 2017-06-27 MED ORDER — ALPRAZOLAM 0.25 MG PO TABS: 0.2500 mg | ORAL_TABLET | Freq: Two times a day (BID) | ORAL | 0 refills | Status: DC | PRN

## 2017-06-27 NOTE — Telephone Encounter (Signed)
   Reason for Disposition . [1] Symptoms of anxiety or panic AND [2] has not been evaluated for this by physician  Answer Assessment - Initial Assessment Questions 1. CONCERN: "What happened that made you call today?"     Seen in ED with panic attack. 2. ANXIETY SYMPTOM SCREENING: "Can you describe how you have been feeling?"  (e.g., tense, restless, panicky, anxious, keyed up, trouble sleeping, trouble concentrating)     Crying yesterday, feeling anxious. 3. ONSET: "How long have you been feeling this way?"     3-4 DAYS 4. RECURRENT: "Have you felt this way before?"  If yes: "What happened that time?" "What helped these feelings go away in the past?"      Yes 5. RISK OF HARM - SUICIDAL IDEATION:  "Do you ever have thoughts of hurting or killing yourself?"  (e.g., yes, no, no but preoccupation with thoughts about death)   - INTENT:  "Do you have thoughts of hurting or killing yourself right NOW?" (e.g., yes, no, N/A)   - PLAN: "Do you have a specific plan for how you would do this?" (e.g., gun, knife, overdose, no plan, N/A)     No 6. RISK OF HARM - HOMICIDAL IDEATION:  "Do you ever have thoughts of hurting or killing someone else?"  (e.g., yes, no, no but preoccupation with thoughts about death)   - INTENT:  "Do you have thoughts of hurting or killing someone right NOW?" (e.g., yes, no, N/A)   - PLAN: "Do you have a specific plan for how you would do this?" (e.g., gun, knife, no plan, N/A)      No 7. FUNCTIONAL IMPAIRMENT: "How have things been going for you overall in your life? Have you had any more difficulties than usual doing your normal daily activities?"  (e.g., better, same, worse; self-care, school, work, interactions)     No - doesn't feel like doing anything 8. SUPPORT: "Who is with you now?" "Who do you live with?" "Do you have family or friends nearby who you can talk to?"      Pt.'s husband. 9. THERAPIST: "Do you have a counselor or therapist? Name?"     No 10. STRESSORS:  "Has there been any new stress or recent changes in your life?"       No 11. CAFFEINE ABUSE: "Do you drink caffeinated beverages, and how much each day?" (e.g., coffee, tea, colas)       No 12. SUBSTANCE ABUSE: "Do you use any illegal drugs or alcohol?"       No 13. OTHER SYMPTOMS: "Do you have any other physical symptoms right now?" (e.g., chest pain, palpitations, difficulty breathing, fever)       Will short of breath and hot. 14. PREGNANCY: "Is there any chance you are pregnant?" "When was your last menstrual period?"       No  Protocols used: ANXIETY AND PANIC ATTACK-A-AH

## 2017-06-27 NOTE — Progress Notes (Signed)
Tommi Rumps, MD Phone: [phone]  Blondine Hottel is a 66 y.o. female who presents today for ED follow-up.  Anxiety: Patient notes previously she was treated with fluoxetine for anxiety though came off of this about 2 weeks ago.  Notes her anxiety has been worsening since then.  Started to have panic attacks with feelings that her chest was going to explode and shortness of breath.  She would break into tears at any point with no obvious cause.  She was evaluated in the emergency room with extensive evaluation for VTE and cardiac cause with no obvious cause found.  It was felt to be related to anxiety and she was given Ativan to take.  This was beneficial.  She does not like how it makes her feel.  She has previously been on sertraline and was taken off this by a former physician though patient notes no side effects from this.  No prior therapy experience.  Nothing in particular makes her anxious.  No depression.  No SI or HI.  OSA: She does try to wear CPAP nightly though only wears it for about 3 hours.  Possibly having panic attacks in the middle the night though this could be related to her sleep apnea.  Has a history of migraines.  Has had them her whole life.  Has had them more frequently recently.  She tried Imitrex though this was not beneficial.  Some aura with it.  No numbness or weakness.  Has not seen neurology in some time.  Social History   Tobacco Use  Smoking Status Former Smoker  . Packs/day: 0.25  . Years: 10.00  . Pack years: 2.50  . Types: Cigarettes  Smokeless Tobacco Former Systems developer  . Quit date: 07/11/2001  Tobacco Comment   smoked for 10 years     ROS see history of present illness  Objective  Physical Exam Vitals:   06/27/17 1054  BP: 136/78  Pulse: (!) 56  Temp: 97.6 F (36.4 C)  SpO2: 96%    BP Readings from Last 3 Encounters:  06/27/17 136/78  06/26/17 (!) 147/74  06/12/17 (!) 144/92   Wt Readings from Last 3 Encounters:  06/27/17 (!) 313  lb 12.8 oz (142.3 kg)  06/26/17 290 lb (131.5 kg)  06/12/17 (!) 311 lb 13.9 oz (141.5 kg)    Physical Exam  Constitutional: No distress.  Cardiovascular: Normal rate, regular rhythm and normal heart sounds.  Pulmonary/Chest: Effort normal and breath sounds normal.  Musculoskeletal: She exhibits no edema.  Neurological: She is alert. Gait normal.  CN 2-12 intact, 5/5 strength in bilateral biceps, triceps, grip, quads, hamstrings, plantar and dorsiflexion, sensation to light touch intact in bilateral UE and LE, normal gait  Skin: Skin is warm and dry. She is not diaphoretic.     Assessment/Plan: Please see individual problem list.  Anxiety and depression Patient with fairly extensive anxiety.  She was recently taken off of duloxetine and this has gotten worse.  No significant depression.  She is willing to see a therapist.  A referral will be placed.  Given her history of prolonged QTC we will check with her pharmacist to see what the best option would be.  We will provide her with a small amount of Xanax to try for her anxiety in the short-term as Ativan made her feel.  She will discontinue the Ativan.  She is advised not to take them at the same time.  She is given return precautions.  OSA (obstructive sleep apnea) Encouraged  appropriate use of her CPAP.  Migraine with aura and without status migrainosus, not intractable More frequent migraines recently.  Will refer to neurology for evaluation.   Earleen was seen today for anxiety.  Diagnoses and all orders for this visit:  Migraine with aura and without status migrainosus, not intractable -     Ambulatory referral to Neurology  Anxiety and depression -     Ambulatory referral to Psychology  OSA (obstructive sleep apnea)  Other orders -     ALPRAZolam (XANAX) 0.25 MG tablet; Take 1 tablet (0.25 mg total) by mouth 2 (two) times daily as needed for anxiety.    Orders Placed This Encounter  Procedures  . Ambulatory  referral to Neurology    Referral Priority:   Routine    Referral Type:   Consultation    Referral Reason:   Specialty Services Required    Requested Specialty:   Neurology    Number of Visits Requested:   1  . Ambulatory referral to Psychology    Referral Priority:   Routine    Referral Type:   Psychiatric    Referral Reason:   Specialty Services Required    Requested Specialty:   Psychology    Number of Visits Requested:   1    Meds ordered this encounter  Medications  . ALPRAZolam (XANAX) 0.25 MG tablet    Sig: Take 1 tablet (0.25 mg total) by mouth 2 (two) times daily as needed for anxiety.    Dispense:  5 tablet    Refill:  0     Tommi Rumps, MD [place]

## 2017-06-27 NOTE — Assessment & Plan Note (Signed)
Encouraged appropriate use of her CPAP.

## 2017-06-27 NOTE — Telephone Encounter (Signed)
No availability at [facility] or [facility]. Left message for pt. To call back to see if she would be willing to be seen in another office.

## 2017-06-27 NOTE — Patient Instructions (Signed)
Nice to meet you. Your going to check with our clinical pharmacist to determine the best anxiety medication for you. We are going to refer you to neurology as well.  If your anxiety worsens significantly or you develop worsening headaches or develop thoughts of harming yourself or others please be evaluated immediately.

## 2017-06-27 NOTE — Assessment & Plan Note (Signed)
Patient with fairly extensive anxiety.  She was recently taken off of duloxetine and this has gotten worse.  No significant depression.  She is willing to see a therapist.  A referral will be placed.  Given her history of prolonged QTC we will check with her pharmacist to see what the best option would be.  We will provide her with a small amount of Xanax to try for her anxiety in the short-term as Ativan made her feel.  She will discontinue the Ativan.  She is advised not to take them at the same time.  She is given return precautions.

## 2017-06-27 NOTE — Assessment & Plan Note (Signed)
More frequent migraines recently.  Will refer to neurology for evaluation.

## 2017-06-28 ENCOUNTER — Telehealth: Payer: Self-pay | Admitting: Family Medicine

## 2017-06-28 NOTE — Telephone Encounter (Signed)
-----   Message from Amber Barnes, [facility] sent at 06/28/2017  3:20 PM EST ----- Regarding: SSRI and QT issues The two that I really worry about with QT prolongation are citalopram (well documented, dose related, especially in elderly), and escitalopram (statistically significant in clinical studies compared to placebo). The rest have relatively low risk/weren't found to prolong QT vs placebo in studies - although case reports do exist in settings of overdose for almost all of them.   I see Buspar was started 11/30 ? It might not have had time to work yet. Usually takes 4-6 weeks minimum to see improvement in anxiety sx. Wouldn't push dose until she's been on it that long.  Looks like she's tried fluoxetine in past and it was ineffective, also tried sertraline in past and ineffective. This is unfortunate because those would have been my first line recommendations. Paroxetine has FDA approval for anxiety but unfortunately is associated with higher chance of weight gain and has a relatively short t 1/2 so compliance is important.   For her, I'd try addition of fluvoxamine (brand name = Luvox). It's tier 2 on her insurance. It is approved for OCD but has data with depression and anxiety. Has decently high rate of nausea vs placebo (40% vs 14%) but this should go away in 1-2 weeks of use. Also has decent rate of insomnia vs placebo (21% vs 10%)- have her take this in the AM. Starting dose is 50 mg /day, usual dose 100-300 mg/day.   Hope this helps! Chrys Racer    ----- Message ----- From: Leone Haven, MD Sent: 06/27/2017  12:29 PM To: Amber Barnes, [place],   I wanted to see if you could point me in the right direction of an appropriate medication for this patients anxiety. She had been on cymbalta though margaret took her off of this relating to bradycardia and QT prolongation and due to it not working as well as prior. The patients symptoms have worsened since then and I wanted  to see what the best option would be given the QTc issue. I know a number of SSRIs are associated with QTc issues. She is currently on buspar. I thought about increasing the buspar. Let me know what you think.   Randall Hiss

## 2017-06-28 NOTE — Telephone Encounter (Signed)
Please let the patient know that I heard back from our pharmacist.  She notes the best option would likely be a medication called fluvoxamine.  We could trial this if it is affordable.  If she is willing I can send this to her pharmacy.  Please also confirm that she has been on 10 mg of BuSpar for several months.  If she has we could increase the dose of the BuSpar as another option.  Thanks.

## 2017-06-28 NOTE — Telephone Encounter (Signed)
Copied from [place] [id number]. Topic: Quick Communication - See Telephone Encounter >> Jun 28, 2017  4:48 PM Vernona Rieger wrote: CRM for notification. See Telephone encounter for:   06/28/17.  Pt was seen in the office by Dr Caryl Bis on 12/18. He told her he was going to call her in a script for xanex. Husband is just following up with this. He did give her 5 pills yesterday and she is almost out. Please call back @ [phone]

## 2017-06-29 MED ORDER — FLUVOXAMINE MALEATE 50 MG PO TABS: 50.0000 mg | ORAL_TABLET | Freq: Every day | ORAL | 2 refills | Status: DC

## 2017-06-29 MED ORDER — ALPRAZOLAM 0.25 MG PO TABS: 0.2500 mg | ORAL_TABLET | Freq: Three times a day (TID) | ORAL | 0 refills | Status: DC | PRN

## 2017-06-29 NOTE — Telephone Encounter (Signed)
Please check with the patient to see how she is tolerating this and see if it has been beneficial.  Please also see how frequently she is taking this.  Thanks.

## 2017-06-29 NOTE — Telephone Encounter (Signed)
Please advise 

## 2017-06-29 NOTE — Telephone Encounter (Signed)
Noted.  Fluvoxamine has been sent to her pharmacy.  Please make sure she has 6-month follow-up with me or [name].

## 2017-06-29 NOTE — Telephone Encounter (Signed)
Patient states she is tolerating this good, it has been beneficial and she is taking this every 8 hours

## 2017-06-29 NOTE — Telephone Encounter (Signed)
Ok to refill. Please fax. Please advise the patient that this will not be a long term prescription and she should start on the fluvoxamine that was sent to the pharmacy as well. Thanks.

## 2017-06-29 NOTE — Telephone Encounter (Signed)
Patient states she is on buspar 10 mg half a tablet in the morning and half a tablet at night due to her low heart rate, she states this was recently changed by [name]. She has been on this for a long time she said

## 2017-06-29 NOTE — Telephone Encounter (Signed)
Patient is scheduled with [name], she would like to know if she should stop the xanax after starting the fluvoxamine

## 2017-06-29 NOTE — Telephone Encounter (Signed)
Patient would like to try fluvoxamine due to the buspar not helping. Patient or patients husband would like a call to be informed when medication has been sent to pharmacy. Please advise.

## 2017-06-29 NOTE — Addendum Note (Signed)
Addended by: Caryl Bis, ERIC G on: 06/29/2017 12:50 PM   Modules accepted: Orders

## 2017-06-29 NOTE — Telephone Encounter (Signed)
She can continue the xanax for short term use while initiating the fluvoxamine.

## 2017-06-30 NOTE — Telephone Encounter (Signed)
Patient notified

## 2017-07-04 ENCOUNTER — Other Ambulatory Visit: Payer: Self-pay | Admitting: Family Medicine

## 2017-07-07 NOTE — Telephone Encounter (Signed)
Pt called to check on statue of this refill . Pt needs refill losartan (COZAAR) 25 MG tablet  She is out of med,  Dr Lorelei Pont prescribed this duringa [facility] to [facility], but she is a pt of [employer] at [employer]

## 2017-07-10 NOTE — Telephone Encounter (Signed)
This was sent to [facility] should have been sent to pharmacy. Pt is out of meds and would like it filled. Pharmacy is [facility] [address]

## 2017-07-18 ENCOUNTER — Other Ambulatory Visit: Payer: Self-pay | Admitting: Family Medicine

## 2017-07-18 NOTE — Telephone Encounter (Signed)
Please fax Xanax prescription.  Please check with the patient to see if her anxiety has improved with the addition of Luvox.

## 2017-07-18 NOTE — Telephone Encounter (Signed)
Last OV 06/27/17 last filled 06/29/17 15 0rf patient of [name]

## 2017-07-19 NOTE — Telephone Encounter (Signed)
Patient states it has improved some but not as myuch as she thought, she states she still has panic attacks at night but not as frequent. Patient states she does have an appointment the end of this month.

## 2017-07-20 NOTE — Telephone Encounter (Signed)
Glad to hear they are improving.  It appears her appointment is in February.  Please remind her the date of her appointment.  Thanks.

## 2017-07-21 ENCOUNTER — Ambulatory Visit: Payer: Medicare Other | Admitting: Neurology

## 2017-07-21 ENCOUNTER — Encounter: Payer: Self-pay | Admitting: Neurology

## 2017-07-21 VITALS — BP 157/78 | HR 46 | Ht 65.0 in | Wt 345.0 lb

## 2017-07-21 DIAGNOSIS — G43009 Migraine without aura, not intractable, without status migrainosus: Secondary | ICD-10-CM

## 2017-07-21 MED ORDER — RIZATRIPTAN BENZOATE 10 MG PO TABS: 10.0000 mg | ORAL_TABLET | ORAL | 0 refills | Status: DC | PRN

## 2017-07-21 NOTE — Telephone Encounter (Signed)
faxed

## 2017-07-21 NOTE — Patient Instructions (Addendum)
Rizatriptan(Maxalt): Please take one tablet at the onset of your headache. If it does not improve the symptoms please take one additional tablet. Do not take more then 2 tablets in 24hrs. Do not take use more then 2 to 3 times in a week. May take with Excedrin   Migraine Headache A migraine headache is a very strong throbbing pain on one side or both sides of your head. Migraines can also cause other symptoms. Talk with your doctor about what things may bring on (trigger) your migraine headaches. Follow these instructions at home: Medicines  Take over-the-counter and prescription medicines only as told by your doctor.  Do not drive or use heavy machinery while taking prescription pain medicine.  To prevent or treat constipation while you are taking prescription pain medicine, your doctor may recommend that you: ? Drink enough fluid to keep your pee (urine) clear or pale yellow. ? Take over-the-counter or prescription medicines. ? Eat foods that are high in fiber. These include fresh fruits and vegetables, whole grains, and beans. ? Limit foods that are high in fat and processed sugars. These include fried and sweet foods. Lifestyle  Avoid alcohol.  Do not use any products that contain nicotine or tobacco, such as cigarettes and e-cigarettes. If you need help quitting, ask your doctor.  Get at least 8 hours of sleep every night.  Limit your stress. General instructions   Keep a journal to find out what may bring on your migraines. For example, write down: ? What you eat and drink. ? How much sleep you get. ? Any change in what you eat or drink. ? Any change in your medicines.  If you have a migraine: ? Avoid things that make your symptoms worse, such as bright lights. ? It may help to lie down in a dark, quiet room. ? Do not drive or use heavy machinery. ? Ask your doctor what activities are safe for you.  Keep all follow-up visits as told by your doctor. This is  important. Contact a doctor if:  You get a migraine that is different or worse than your usual migraines. Get help right away if:  Your migraine gets very bad.  You have a fever.  You have a stiff neck.  You have trouble seeing.  Your muscles feel weak or like you cannot control them.  You start to lose your balance a lot.  You start to have trouble walking.  You pass out (faint). This information is not intended to replace advice given to you by your health care provider. Make sure you discuss any questions you have with your health care provider. Document Released: 04/05/2008 Document Revised: 01/15/2016 Document Reviewed: 12/14/2015 Elsevier Interactive Patient Education  2018 [employer].  Rizatriptan tablets What is this medicine? RIZATRIPTAN (rye za TRIP tan) is used to treat migraines with or without aura. An aura is a strange feeling or visual disturbance that warns you of an attack. It is not used to prevent migraines. This medicine may be used for other purposes; ask your health care provider or pharmacist if you have questions. COMMON BRAND NAME(S): Maxalt What should I tell my health care provider before I take this medicine? They need to know if you have any of these conditions: -bowel disease or colitis -diabetes -family history of heart disease -fast or irregular heart beat -heart or blood vessel disease, angina (chest pain), or previous heart attack -high blood pressure -high cholesterol -history of stroke, transient ischemic attacks (TIAs or mini-strokes),  or intracranial bleeding -kidney or liver disease -overweight -poor circulation -postmenopausal or surgical removal of uterus and ovaries -Raynaud's disease -seizure disorder -an unusual or allergic reaction to rizatriptan, other medicines, foods, dyes, or preservatives -pregnant or trying to get pregnant -breast-feeding How should I use this medicine? This medicine is taken by mouth with a glass  of water. Follow the directions on the prescription label. This medicine is taken at the first symptoms of a migraine. It is not for everyday use. If your migraine headache returns after one dose, you can take another dose as directed. You must leave at least 2 hours between doses, and do not take more than 30 mg total in 24 hours. If there is no improvement at all after the first dose, do not take a second dose without talking to your doctor or health care professional. Do not take your medicine more often than directed. Talk to your pediatrician regarding the use of this medicine in children. While this drug may be prescribed for children as young as 6 years for selected conditions, precautions do apply. Overdosage: If you think you have taken too much of this medicine contact a poison control center or emergency room at once. NOTE: This medicine is only for you. Do not share this medicine with others. What if I miss a dose? This does not apply; this medicine is not for regular use. What may interact with this medicine? Do not take this medicine with any of the following medicines: -amphetamine, dextroamphetamine or cocaine -dihydroergotamine, ergotamine, ergoloid mesylates, methysergide, or ergot-type medication - do not take within 24 hours of taking rizatriptan -feverfew -MAOIs like Carbex, Eldepryl, Marplan, Nardil, and Parnate - do not take rizatriptan within 2 weeks of stopping MAOI therapy. -other migraine medicines like almotriptan, eletriptan, naratriptan, sumatriptan, zolmitriptan - do not take within 24 hours of taking rizatriptan -tryptophan This medicine may also interact with the following medications: -medicines for mental depression, anxiety or mood problems -propranolol This list may not describe all possible interactions. Give your health care provider a list of all the medicines, herbs, non-prescription drugs, or dietary supplements you use. Also tell them if you smoke, drink  alcohol, or use illegal drugs. Some items may interact with your medicine. What should I watch for while using this medicine? Only take this medicine for a migraine headache. Take it if you get warning symptoms or at the start of a migraine attack. It is not for regular use to prevent migraine attacks. You may get drowsy or dizzy. Do not drive, use machinery, or do anything that needs mental alertness until you know how this medicine affects you. To reduce dizzy or fainting spells, do not sit or stand up quickly, especially if you are an older patient. Alcohol can increase drowsiness, dizziness and flushing. Avoid alcoholic drinks. Smoking cigarettes may increase the risk of heart-related side effects from using this medicine. If you take migraine medicines for 10 or more days a month, your migraines may get worse. Keep a diary of headache days and medicine use. Contact your healthcare professional if your migraine attacks occur more frequently. What side effects may I notice from receiving this medicine? Side effects that you should report to your doctor or health care professional as soon as possible: -allergic reactions like skin rash, itching or hives, swelling of the face, lips, or tongue -fast, slow, or irregular heart beat -increased or decreased blood pressure -seizures -severe stomach pain and cramping, bloody diarrhea -signs and symptoms of a blood  clot such as breathing problems; changes in vision; chest pain; severe, sudden headache; pain, swelling, warmth in the leg; trouble speaking; sudden numbness or weakness of the face, arm or leg -tingling, pain, or numbness in the face, hands, or feet Side effects that usually do not require medical attention (report to your doctor or health care professional if they continue or are bothersome): -drowsiness -dry mouth -feeling warm, flushing, or redness of the face -headache -muscle cramps, pain -nausea, vomiting -unusually weak or  tired This list may not describe all possible side effects. Call your doctor for medical advice about side effects. You may report side effects to FDA at 1-800-FDA-1088. Where should I keep my medicine? Keep out of the reach of children. Store at room temperature between 15 and 30 degrees C (59 and 86 degrees F). Keep container tightly closed. Throw away any unused medicine after the expiration date. NOTE: This sheet is a summary. It may not cover all possible information. If you have questions about this medicine, talk to your doctor, pharmacist, or health care provider.  2018 Elsevier/Gold Standard (2013-02-26 10:16:39)

## 2017-07-21 NOTE — Progress Notes (Signed)
[facility]    Provider:  Dr Jaynee Eagles Referring Provider: [name], FNP Primary Care Physician:  [name], FNP  CC:  migraines  HPI:  Amber Barnes is a 67 y.o. female here as a referral from Dr. Vidal Schwalbe for Migraines.  Past medical history obstructive sleep apnea noncompliant with CPAP, morbid obesity, migraines, panic attacks, hypertension, depression, hyperlipidemia, atrial fibrillation. Migraines since a child, no known family history. She has a few a year. They can be severe, they have lasted up to 30 days n the past, shots of demerol helped. They have improved in the last 15-20 years. Worse with stress. She has them infrequently. Quality, quantity, frequency have improved. Excedrin migraine helps. The last migraine 12/17 however started on the left side, with arm pain and numbness. She went to the hospital. Never had a migraine like that. Usually migraines are holocephalic, pulsating, light and sound sensitivity and nausea. Never started on the left side like this, massage helped. She can feel hungover after. afib is new beginning of January she has afib. She has motion sensitivity. No other focal neurologic deficits, associated symptoms, inciting events or modifiable factors.    Reviewed notes, labs and imaging from outside physicians, which showed:  CT head 07/21/2017 showed No acute intracranial abnormalities including mass lesion or mass effect, hydrocephalus, extra-axial fluid collection, midline shift, hemorrhage, or acute infarction, large ischemic events (personally reviewed images)     Reviewed referring physician notes.  She is on fluoxetine for anxiety, her anxiety has been worsening since taking off, panic attacks, crying with no obvious cause, she was especially weight in the emergency room extensively and no obvious cause found such as no cardiac symptoms, felt to be related to anxiety.  She also tried sertraline.  Denies depression.   She has sleep apnea and only wears it 3 hours a night.  Appears to be noncompliant with sleep apnea.  Has had migraines her whole life, more frequently recently, Imitrex was not beneficial, no numbness or weakness.  She has seen a neurologist in the past.    Review of Systems: Patient complains of symptoms per HPI as well as the following symptoms: Past medical history weight gain, fatigue, easy bruising, easy bleeding, shortness of breath, chest pain, joint pain joint swelling, aching muscles, runny nose, headache, numbness, weakness, restless legs, dizziness, tremor, depression, anxiety, decreased energy, disinterest in activities. Pertinent negatives and positives per HPI. All others negative.   Social History   Socioeconomic History  . Marital status: Married    Spouse name: Not on file  . Number of children: 1  . Years of education: Not on file  . Highest education level: High school graduate  Social Needs  . Financial resource strain: Not on file  . Food insecurity - worry: Not on file  . Food insecurity - inability: Not on file  . Transportation needs - medical: Not on file  . Transportation needs - non-medical: Not on file  Occupational History  . Not on file  Tobacco Use  . Smoking status: Former Smoker    Packs/day: 0.25    Years: 10.00    Pack years: 2.50    Types: Cigarettes    Last attempt to quit: 2005    Years since quitting: 14.0  . Smokeless tobacco: Former Systems developer    Quit date: 07/11/2001  . Tobacco comment: smoked for 10 years, 1 pack every 2-3 days  Substance and Sexual Activity  . Alcohol use: Yes  Comment: social, occasional  . Drug use: No  . Sexual activity: Not on file  Other Topics Concern  . Not on file  Social History Narrative   Moved to [place] from [place], originally from [place].    1 cup of caffeine daily   Right handed           Family History  Problem Relation Age of Onset  . Cancer Sister 10       ovarian  .  Cancer Father 16       colon  . Heart disease Father   . Heart attack Father 104       died from MI  . Hyperlipidemia Father   . Hypertension Father   . Cancer Mother        leukemia  . Breast cancer Paternal Aunt        31's    Past Medical History:  Diagnosis Date  . Achilles tendinitis   . Acute medial meniscal tear   . Allergy   . Anxiety   . Arthritis   . Atrial fibrillation ([place])    a. new onset 07/2016; b. CHADS2VASc --> 2 (HTN, female); c. eliquis  . CHF (congestive heart failure) ([place])   . Chronic lower back pain   . COPD (chronic obstructive pulmonary disease) ([place])   . Depression   . Fibrocystic breast disease   . GERD (gastroesophageal reflux disease)   . Hyperlipidemia   . Hypertension   . Insomnia   . Lipoma   . Migraine   . Osteoarthritis    left knee  . Osteopenia   . Tendonitis   . Thyroid disease   . Vitamin D deficiency     Past Surgical History:  Procedure Laterality Date  . ABDOMINAL HYSTERECTOMY     ovaries left  . ACHILLES TENDON SURGERY     2012,01/2017  . BACK SURGERY    . CARDIOVERSION N/A 09/07/2016   Procedure: CARDIOVERSION;  Surgeon: Minna Merritts, MD;  Location: [facility] ORS;  Service: Cardiovascular;  Laterality: N/A;  . CARDIOVERSION N/A 10/19/2016   Procedure: CARDIOVERSION;  Surgeon: Minna Merritts, MD;  Location: [facility] ORS;  Service: Cardiovascular;  Laterality: N/A;  . CARDIOVERSION N/A 12/14/2016   Procedure: CARDIOVERSION;  Surgeon: Minna Merritts, MD;  Location: [facility] OR[facility];  Service: Cardiovascular;  Laterality: N/A;  . COLONOSCOPY    . ESOPHAGOGASTRODUODENOSCOPY    . THYROIDECTOMY     thyroid lobectomy secondary to a benign nodule    Current Outpatient Medications  Medication Sig Dispense Refill  . ALPRAZolam (XANAX) 0.25 MG tablet TAKE 1 TABLET BY MOUTH 3 TIMES A DAY AS NEEDED FOR ANXIETY 15 tablet 0  . amiodarone (PACERONE) 200 MG tablet TAKE 1 TABLET (200 MG TOTAL) BY MOUTH 2 (TWO) TIMES DAILY. 70 tablet 6  . B  Complex Vitamins (VITAMIN B COMPLEX PO) Take 1 tablet by mouth daily.     . bisoprolol (ZEBETA) 10 MG tablet TAKE 1 TABLET (10 MG TOTAL) BY MOUTH 2 (TWO) TIMES DAILY. (Patient taking differently: Take 5 mg by mouth 2 (two) times daily. ) 60 tablet 3  . busPIRone (BUSPAR) 10 MG tablet Take 1 tablet (10 mg total) by mouth 2 (two) times daily. 180 tablet 0  . calcium gluconate 500 MG tablet Take 500 tablets by mouth daily.    . Cholecalciferol (VITAMIN D3) 5000 units CAPS Take 5,000 Units by mouth daily.    Marland Kitchen ELIQUIS 5 MG TABS tablet TAKE  1 TABLET BY MOUTH TWICE A DAY 60 tablet 3  . esomeprazole (NEXIUM) 20 MG capsule Take 20 mg by mouth daily as needed (acid reflux).    . fluvoxaMINE (LUVOX) 50 MG tablet Take 1 tablet (50 mg total) by mouth at bedtime. 30 tablet 2  . furosemide (LASIX) 20 MG tablet Take 1 tablet (20 mg total) by mouth 2 (two) times daily. (Patient taking differently: Take 20 mg by mouth daily. ) 180 tablet 3  . gabapentin (NEURONTIN) 100 MG capsule TAKE 2 CAPSULES BY MOUTH TWICE A DAY 90 capsule 2  . losartan (COZAAR) 25 MG tablet TAKE 1 TABLET BY MOUTH EVERY DAY 30 tablet 0  . Multiple Vitamin (MULTIVITAMIN) tablet Take 1 tablet by mouth daily.    Marland Kitchen oxymetazoline (AFRIN) 0.05 % nasal spray Place 1 spray into both nostrils at bedtime as needed for congestion.     . potassium chloride (KLOR-CON 10) 10 MEQ tablet Take 1 tablet (10 mEq total) by mouth 2 (two) times daily. (Patient taking differently: Take 10 mEq by mouth daily. ) 60 tablet 2  . RANEXA 500 MG 12 hr tablet TAKE 1 TABLET BY MOUTH TWICE A DAY 60 tablet 2  . rosuvastatin (CRESTOR) 20 MG tablet TAKE 1 TABLET BY MOUTH EVERY DAY 90 tablet 1  . sennosides-docusate sodium (SENOKOT-S) 8.6-50 MG tablet Take 1 tablet by mouth at bedtime as needed for constipation.     . rizatriptan (MAXALT) 10 MG tablet Take 1 tablet (10 mg total) by mouth as needed for migraine. May repeat in 2 hours if needed 10 tablet 0   No current  facility-administered medications for this visit.     Allergies as of 07/21/2017 - Review Complete 07/21/2017  Allergen Reaction Noted  . Prednisone Other (See Comments) 12/18/2014  . Hydrocodone-acetaminophen Other (See Comments) 07/14/2016  . Oxycodone Itching 02/06/2017  . Adhesive [tape] Itching and Rash 08/12/2015  . Latex Itching and Rash 08/12/2015    Vitals: BP (!) 157/78 (BP Location: Right Arm, Patient Position: Sitting)   Pulse (!) 46 Comment: not unusual for patient per report  Ht 5\' 5"  (1.651 m)   Wt (!) 345 lb (156.5 kg)   BMI 57.41 kg/m  Last Weight:  Wt Readings from Last 1 Encounters:  07/21/17 (!) 345 lb (156.5 kg)   Last Height:   Ht Readings from Last 1 Encounters:  07/21/17 5\' 5"  (1.651 m)    Physical exam: Exam: Gen: NAD, conversant, well nourised, morbidly obese, well groomed                     CV: RRR, no MRG. No Carotid Bruits. +peripheral edema, warm, nontender Eyes: Conjunctivae clear without exudates or hemorrhage  Neuro: Detailed Neurologic Exam  Speech:    Speech is normal; fluent and spontaneous with normal comprehension.  Cognition:    The patient is oriented to person, place, and time;     recent and remote memory intact;     language fluent;     normal attention, concentration,     fund of knowledge Cranial Nerves:    The pupils are equal, round, and reactive to light. Attempted fundoscopic exam could not visualize. Visual fields are full to finger confrontation. Extraocular movements are intact. Trigeminal sensation is intact and the muscles of mastication are normal. The face is symmetric. The palate elevates in the midline. Hearing intact. Voice is normal. Shoulder shrug is normal. The tongue has normal motion without fasciculations.   Coordination:  Normal finger to nose   Gait:    Wide-based  likely because of large body habitus.    Motor Observation:    No asymmetry, no atrophy, and no involuntary movements  noted. Tone:    Normal muscle tone.    Posture:    Posture is normal. normal erect    Strength:    Strength is V/V in the upper and lower limbs.      Sensation: intact to LT     Reflex Exam:  DTR's:    Deep tendon reflexes in the upper and lower extremities are reduced bilaterally likely because of large body habitus.   Toes:    The toes are equivocal bilaterally.   Clonus:    Clonus is absent.      Assessment/Plan:  Patient with migraines without aura, infrequent   Acute management migraine: maxalt. May take with excedrin.Take at onset.  afib improved, on Eliquis, continue for stroke prevention CPAP: she doesn;t use it "as much as they tell me to". Encouraged use and discussed sequelae of untreated OSA such as stroke. Morbid obesity: recommended [facility], no referral needed  Discussed: To prevent or relieve headaches, try the following: Cool Compress. Lie down and place a cool compress on your head.  Avoid headache triggers. If certain foods or odors seem to have triggered your migraines in the past, avoid them. A headache diary might help you identify triggers.  Include physical activity in your daily routine. Try a daily walk or other moderate aerobic exercise.  Manage stress. Find healthy ways to cope with the stressors, such as delegating tasks on your to-do list.  Practice relaxation techniques. Try deep breathing, yoga, massage and visualization.  Eat regularly. Eating regularly scheduled meals and maintaining a healthy diet might help prevent headaches. Also, drink plenty of fluids.  Follow a regular sleep schedule. Sleep deprivation might contribute to headaches Consider biofeedback. With this mind-body technique, you learn to control certain bodily functions - such as muscle tension, heart rate and blood pressure - to prevent headaches or reduce headache pain.    Proceed to emergency room if you experience new or worsening symptoms or symptoms do  not resolve, if you have new neurologic symptoms or if headache is severe, or for any concerning symptom.   Provided education and documentation from American headache Society toolbox including articles on: chronic migraine medication overuse headache, chronic migraines, prevention of migraines, behavioral and other nonpharmacologic treatments for headache.   Sarina Ill, MD  [facility] [address] [place], [place] [address]  Phone [phone] Fax [phone]

## 2017-07-22 NOTE — Progress Notes (Signed)
Cardiology Office Note  Date:  07/24/2017   ID:  Amber Barnes, DOB 1950-07-13, MRN [id number]  PCP:  Amber Hawthorne, FNP   Chief Complaint  Patient presents with  . other    6 month follow up. Meds reviewed by the pt. verbally. Pt. c/o shortness of breath.     HPI:  [name] is a 67 year-old woman with history of  Paroxysmal atrial fibrillation Prior cardioversion obesity,  arthritis,  hyperlipidemia,  hypertension,  OSA on CPAP migraines Stress test January 2018 showing no ischemia, Ejection fraction 55-65% who presents for follow-up of her hyperlipidemia, hypertension, persistent atrial fibrillation  Out of her bisoprolol for 4 days Was on 5 mg BID, heart rate 48 bpm at times Still taking amiodarone 200 BID Malaise, fatigue, nausea Chronic "no energy"  No regular exercise program Does have by Home but no energy to use it  Takes lasix 1 to 2 a day  Recovered from ankle surgery Left ankle with swelling more than the right  Weight continues to run high  EKG personally reviewed by myself on todays visit Sinus bradycardia rate 54 bpm no significant ST or T-wave changes  Other past medical history reviewed cardioversion procedure 09/07/2016 Normal sinus rhythm restored at that time Back into atrial fibrillation on last hospital visit March 13  Flecainide held, on amiodarone Repeat cardioversion 10/19/2016 Normal sinus rhythm restored  Recurrent atrial fib Seen by Dr. Caryl Barnes, Started on ranexa and continued on amiodarone She converted to normal sinus rhythm on her own, did not need DCCV  In the hospital for foot surgery, this was delayed as she was found to be in atrial fibrillation Admitted to the hospital 07/27/2016 Started on diltiazem, beta blocker, anticoagulation  Back in hospsital 08/10/2016 Hospital records reviewed She was very anxious about palpitations Developed chest discomfort, tightness Medication doses were increased for better rate  control Lasix in the emergency room for shortness of breath Also given DuoNeb's  Previous problems with sleep, Bad sleep, dreams  Total cholesterol 156, LDL 74, creatinine 0.69hematocrit 39  She did not tolerate Lipitor and cholesterol climbed up to 285, LDL 196 On Lipitor total cholesterol 168, LDL 88 in October 2015 No recent lipid panel available on Crestor She is scheduled to have repeat lab work at the end of May 2016 with primary care  She reports having a prior smoking history for 5 years, then smoked only small amounts  PMH:   has a past medical history of Achilles tendinitis, Acute medial meniscal tear, Allergy, Anxiety, Arthritis, Atrial fibrillation ([place]), CHF (congestive heart failure) ([place]), Chronic lower back pain, COPD (chronic obstructive pulmonary disease) ([place]), Depression, Fibrocystic breast disease, GERD (gastroesophageal reflux disease), Hyperlipidemia, Hypertension, Insomnia, Lipoma, Migraine, Osteoarthritis, Osteopenia, Tendonitis, Thyroid disease, and Vitamin D deficiency.  PSH:    Past Surgical History:  Procedure Laterality Date  . ABDOMINAL HYSTERECTOMY     ovaries left  . ACHILLES TENDON SURGERY     2012,01/2017  . BACK SURGERY    . CARDIOVERSION N/A 09/07/2016   Procedure: CARDIOVERSION;  Surgeon: Minna Merritts, MD;  Location: [facility];  Service: Cardiovascular;  Laterality: N/A;  . CARDIOVERSION N/A 10/19/2016   Procedure: CARDIOVERSION;  Surgeon: Minna Merritts, MD;  Location: [facility];  Service: Cardiovascular;  Laterality: N/A;  . CARDIOVERSION N/A 12/14/2016   Procedure: CARDIOVERSION;  Surgeon: Minna Merritts, MD;  Location: [facility];  Service: Cardiovascular;  Laterality: N/A;  . COLONOSCOPY    . ESOPHAGOGASTRODUODENOSCOPY    .  THYROIDECTOMY     thyroid lobectomy secondary to a benign nodule    Current Outpatient Medications  Medication Sig Dispense Refill  . ALPRAZolam (XANAX) 0.25 MG tablet TAKE 1 TABLET BY MOUTH 3 TIMES A DAY AS  NEEDED FOR ANXIETY 15 tablet 0  . amiodarone (PACERONE) 200 MG tablet Take 1 tablet (200 mg total) by mouth daily. 90 tablet 3  . B Complex Vitamins (VITAMIN B COMPLEX PO) Take 1 tablet by mouth daily.     . busPIRone (BUSPAR) 10 MG tablet Take 1 tablet (10 mg total) by mouth 2 (two) times daily. 180 tablet 0  . calcium gluconate 500 MG tablet Take 500 tablets by mouth daily.    . Cholecalciferol (VITAMIN D3) 5000 units CAPS Take 5,000 Units by mouth daily.    Marland Kitchen ELIQUIS 5 MG TABS tablet TAKE 1 TABLET BY MOUTH TWICE A DAY 60 tablet 3  . esomeprazole (NEXIUM) 20 MG capsule Take 20 mg by mouth daily as needed (acid reflux).    . fluvoxaMINE (LUVOX) 50 MG tablet Take 1 tablet (50 mg total) by mouth at bedtime. 30 tablet 2  . furosemide (LASIX) 20 MG tablet Take 1 tablet (20 mg total) by mouth 2 (two) times daily. (Patient taking differently: Take 20 mg by mouth daily. ) 180 tablet 3  . gabapentin (NEURONTIN) 100 MG capsule TAKE 2 CAPSULES BY MOUTH TWICE A DAY 90 capsule 2  . losartan (COZAAR) 25 MG tablet TAKE 1 TABLET BY MOUTH EVERY DAY 30 tablet 0  . Multiple Vitamin (MULTIVITAMIN) tablet Take 1 tablet by mouth daily.    Marland Kitchen oxymetazoline (AFRIN) 0.05 % nasal spray Place 1 spray into both nostrils at bedtime as needed for congestion.     . potassium chloride (KLOR-CON 10) 10 MEQ tablet Take 1 tablet (10 mEq total) by mouth 2 (two) times daily. (Patient taking differently: Take 10 mEq by mouth daily. ) 60 tablet 2  . RANEXA 500 MG 12 hr tablet TAKE 1 TABLET BY MOUTH TWICE A DAY 60 tablet 2  . rosuvastatin (CRESTOR) 20 MG tablet TAKE 1 TABLET BY MOUTH EVERY DAY 90 tablet 1  . sennosides-docusate sodium (SENOKOT-S) 8.6-50 MG tablet Take 1 tablet by mouth at bedtime as needed for constipation.     . bisoprolol (ZEBETA) 5 MG tablet Take 1 tablet (5 mg total) by mouth daily. 90 tablet 3  . rizatriptan (MAXALT) 10 MG tablet Take 1 tablet (10 mg total) by mouth as needed for migraine. May repeat in 2 hours if  needed (Patient not taking: Reported on 07/24/2017) 10 tablet 0   No current facility-administered medications for this visit.      Allergies:   Prednisone; Hydrocodone-acetaminophen; Oxycodone; Adhesive [tape]; and Latex   Social History:  The patient  reports that she quit smoking about 14 years ago. Her smoking use included cigarettes. She has a 2.50 pack-year smoking history. She quit smokeless tobacco use about 16 years ago. She reports that she drinks alcohol. She reports that she does not use drugs.   Family History:   family history includes Breast cancer in her paternal aunt; Cancer in her mother; Cancer (age of onset: 50) in her sister; Cancer (age of onset: 25) in her father; Heart attack (age of onset: 75) in her father; Heart disease in her father; Hyperlipidemia in her father; Hypertension in her father.    Review of Systems: Review of Systems  Constitutional: Positive for malaise/fatigue.  Respiratory: Positive for shortness of breath.  Cardiovascular: Positive for leg swelling.  Gastrointestinal: Negative.   Musculoskeletal: Negative.   Neurological: Positive for weakness.  Psychiatric/Behavioral: The patient does not have insomnia ( none).   All other systems reviewed and are negative.    PHYSICAL EXAM: VS:  BP 122/80 (BP Location: Right Wrist, Patient Position: Sitting, Cuff Size: Normal)   Pulse (!) 54   Ht 5\' 5"  (1.651 m)   Wt (!) 323 lb 4 oz (146.6 kg)   BMI 53.79 kg/m  , BMI Body mass index is 53.79 kg/m. GEN: Well nourished, well developed, in no acute distress , Morbid obese  HEENT: normal  Neck: no JVD, carotid bruits, or masses Cardiac:RRR,  no murmurs, rubs, or gallops,no edema  Respiratory:  clear to auscultation bilaterally, normal work of breathing GI: soft, nontender, nondistended, + BS MS: no deformity or atrophy  Skin: warm and dry, no rash Neuro:  Strength and sensation are intact Psych: euthymic mood, full affect     Recent  Labs: 08/10/2016: B Natriuretic Peptide 278.0 11/29/2016: Magnesium 2.1 04/13/2017: ALT 23 06/26/2017: BUN 17; Creatinine, Ser 0.76; Hemoglobin 13.1; Platelets 218; Potassium 3.9; Sodium 139; TSH 5.678    Lipid Panel Lab Results  Component Value Date   CHOL 126 08/11/2016   HDL 45 08/11/2016   LDLCALC 65 08/11/2016   TRIG 82 08/11/2016      Wt Readings from Last 3 Encounters:  07/24/17 (!) 323 lb 4 oz (146.6 kg)  07/21/17 (!) 345 lb (156.5 kg)  06/27/17 (!) 313 lb 12.8 oz (142.3 kg)       ASSESSMENT AND PLAN:   Mixed hyperlipidemia Cholesterol is at goal on the current lipid regimen. No changes to the medications were made. Stable  Essential hypertension Blood pressure is well controlled on today's visit. No changes made to the medications. Recommended she decreased bisoprolol down to 5 mg in the evening secondary to bradycardia and fatigue  Atrial fibrillation with RVR (HCC) Decreased bisoprolol as above  Also recommended she decrease amiodarone down to 200 mg daily She's having fatigue, nausea  SOB (shortness of breath) Chronic issue likely secondary to obesity, deconditioning  Morbid obesity ([place]) We have encouraged continued exercise, careful diet management in an effort to lose weight. Likely contributing to chronic fatigue Recently ate [place] food  Acute pulmonary edema ([place]) Still taking Lasix daily occasionally twice a day    Total encounter time more than 25 minutes  Greater than 50% was spent in counseling and coordination of care with the patient   Disposition:   F/U  6 months   Orders Placed This Encounter  Procedures  . EKG 12-Lead     Signed, Esmond Plants, M.D., Ph.D. 07/24/2017  [place], [place]

## 2017-07-24 ENCOUNTER — Ambulatory Visit: Payer: Medicare Other | Admitting: Cardiovascular Disease

## 2017-07-24 ENCOUNTER — Encounter: Payer: Self-pay | Admitting: Cardiovascular Disease

## 2017-07-24 VITALS — BP 122/80 | HR 54 | Ht 65.0 in | Wt 323.2 lb

## 2017-07-24 DIAGNOSIS — I481 Persistent atrial fibrillation: Secondary | ICD-10-CM

## 2017-07-24 DIAGNOSIS — I10 Essential (primary) hypertension: Secondary | ICD-10-CM

## 2017-07-24 DIAGNOSIS — J810 Acute pulmonary edema: Secondary | ICD-10-CM

## 2017-07-24 DIAGNOSIS — E782 Mixed hyperlipidemia: Secondary | ICD-10-CM | POA: Diagnosis not present

## 2017-07-24 DIAGNOSIS — I4819 Other persistent atrial fibrillation: Secondary | ICD-10-CM

## 2017-07-24 MED ORDER — AMIODARONE HCL 200 MG PO TABS: 200.0000 mg | ORAL_TABLET | Freq: Every day | ORAL | 3 refills | Status: DC

## 2017-07-24 MED ORDER — BISOPROLOL FUMARATE 10 MG PO TABS: 10.0000 mg | ORAL_TABLET | Freq: Every day | ORAL | 3 refills | Status: DC

## 2017-07-24 MED ORDER — BISOPROLOL FUMARATE 5 MG PO TABS: 5.0000 mg | ORAL_TABLET | Freq: Every day | ORAL | 3 refills | Status: DC

## 2017-07-24 NOTE — Patient Instructions (Addendum)
Medication Instructions:   Please decrease the amiodarone down to one a day  Decrease the bisoprolol down to 10 mg in the evening  Monitor blood pressure If it runs low, Call the office  Labwork:  No new labs needed  Testing/Procedures:  No further testing at this time   Follow-Up: It was a pleasure seeing you in the office today. Please call us if you have new issues that need to be addressed before your next appt.  [phone]  Your physician wants you to follow-up in: 6 months.  You will receive a reminder letter in the mail two months in advance. If you don't receive a letter, please call our office to schedule the follow-up appointment.  If you need a refill on your cardiac medications before your next appointment, please call your pharmacy.

## 2017-07-27 ENCOUNTER — Other Ambulatory Visit: Payer: Self-pay | Admitting: Family Medicine

## 2017-07-27 ENCOUNTER — Telehealth: Payer: Self-pay | Admitting: Cardiovascular Disease

## 2017-07-27 NOTE — Telephone Encounter (Signed)
Fax received from [facility] (in [employer]) asking for an alternative to bisoprolol 10 mg daily as the medication is on extended backorder.   I called the pharmacy to inquire if that was for all doses of bisoprolol. Per the pharmacy, the backorder is not from a supplier, but the manufacturer so this is effecting all pharmacies & all doses. They do not know how long the back order will last.   They are asking if the patient can be placed on an alternative beta blocker- no allergies/ intolerances documented for other beta blockers. Will forward to Dr. [name] and Jeannene Patella to review.

## 2017-07-27 NOTE — Telephone Encounter (Signed)
S/w pharmacist Ena Dawley at [facility] that patient is taking amiodarone 200 mg once a day as well as the bisoprolol as it was not discontinued. Prescriptions were sent in on 07/24/17 by Dr [name].

## 2017-07-27 NOTE — Telephone Encounter (Signed)
pharmist is calling needing clarification on Amiodarone . Please call.

## 2017-07-28 ENCOUNTER — Other Ambulatory Visit: Payer: Self-pay | Admitting: Family Medicine

## 2017-07-31 NOTE — Telephone Encounter (Signed)
Try metoprolol succinate 25 in the PM If she develops bradycardia/fatigue, may have to cut in 1/2 daily in pm

## 2017-07-31 NOTE — Telephone Encounter (Signed)
Okay to refill Xanax? Last refilled on: 07/18/17 (see note from 07/18/17) for #15 tablets at [facility].  LOV: 06/27/17 (DR. Sonnenberg) NOV: 08/23/17 Vidal Schwalbe)

## 2017-08-01 NOTE — Telephone Encounter (Signed)
Spoke with patient and reviewed Dr. [name] recommendations to switch since she is not able to get her bisoprolol. She states that she was able to get medication filled at a different pharmacy and has a 90 day supply. Advised that if she should have any trouble getting it filled in the future to call back and we can certainly send in another option. She verbalized understanding and was appreciative for the call.

## 2017-08-01 NOTE — Telephone Encounter (Signed)
Checking status, patient will be out tomorrow. Please advise

## 2017-08-02 NOTE — Telephone Encounter (Signed)
[facility] to [facility] [place].  

## 2017-08-03 ENCOUNTER — Ambulatory Visit: Payer: Medicare Other | Admitting: Psychology

## 2017-08-03 DIAGNOSIS — F410 Panic disorder [episodic paroxysmal anxiety] without agoraphobia: Secondary | ICD-10-CM | POA: Diagnosis not present

## 2017-08-07 ENCOUNTER — Other Ambulatory Visit: Payer: Self-pay | Admitting: Cardiovascular Disease

## 2017-08-07 ENCOUNTER — Other Ambulatory Visit: Payer: Self-pay | Admitting: Family Medicine

## 2017-08-07 ENCOUNTER — Other Ambulatory Visit: Payer: Self-pay | Admitting: Family

## 2017-08-07 NOTE — Telephone Encounter (Signed)
Please advise for refill, Thanks !  

## 2017-08-08 NOTE — Telephone Encounter (Signed)
Okay to refill? Last Rx sent on 08/01/17 was only for 15 tablets. Directions states that pt takes TID prn.  LOV: 06/27/17 with Dr. Caryl Bis NOV: 08/23/17 with Jaci Carrel patient

## 2017-08-09 ENCOUNTER — Telehealth: Payer: Self-pay | Admitting: Cardiovascular Disease

## 2017-08-09 ENCOUNTER — Other Ambulatory Visit: Payer: Self-pay | Admitting: Family

## 2017-08-09 ENCOUNTER — Ambulatory Visit: Payer: Medicare Other | Admitting: Psychology

## 2017-08-09 DIAGNOSIS — G2581 Restless legs syndrome: Secondary | ICD-10-CM

## 2017-08-09 NOTE — Telephone Encounter (Signed)
Spoke with patient and she states that she was able to get 90 day filled at [facility] so she does not need any at this time. Instructed her to please give [place] a call if she is unable to get it refilled. She verbalized understanding with no further questions at this time.

## 2017-08-09 NOTE — Telephone Encounter (Signed)
[facility] is on back order of Bis[facility]rolol (ZEBETA) Would like to know if there is another medication that Dr would like to prescribe Please call [phone]

## 2017-08-09 NOTE — Telephone Encounter (Signed)
There is a Producer, television/film/video and back order for the Bisoprolol and need recommendation for substitution. Dr. [name] reviewed and recommended metoprolol succinate 25 mg in the PM and if she develops bradycardia/fatigue then cut in half once daily in the PM. Will contact patient and send in prescription to her pharmacy of choice.

## 2017-08-10 ENCOUNTER — Ambulatory Visit: Payer: Medicare Other | Admitting: Psychology

## 2017-08-10 DIAGNOSIS — F322 Major depressive disorder, single episode, severe without psychotic features: Secondary | ICD-10-CM

## 2017-08-10 DIAGNOSIS — F418 Other specified anxiety disorders: Secondary | ICD-10-CM

## 2017-08-11 ENCOUNTER — Ambulatory Visit: Payer: Medicare Other | Admitting: Psychology

## 2017-08-14 ENCOUNTER — Ambulatory Visit (INDEPENDENT_AMBULATORY_CARE_PROVIDER_SITE_OTHER)
Admission: RE | Admit: 2017-08-14 | Discharge: 2017-08-14 | Disposition: A | Discharge status: Home / Self Care | Payer: Medicare Other | Source: Ambulatory Visit | Attending: Family Medicine | Admitting: Family Medicine

## 2017-08-14 ENCOUNTER — Ambulatory Visit (INDEPENDENT_AMBULATORY_CARE_PROVIDER_SITE_OTHER): Payer: Medicare Other | Admitting: Family Medicine

## 2017-08-14 ENCOUNTER — Ambulatory Visit: Payer: Self-pay

## 2017-08-14 ENCOUNTER — Other Ambulatory Visit: Payer: Self-pay

## 2017-08-14 ENCOUNTER — Encounter: Payer: Self-pay | Admitting: Family Medicine

## 2017-08-14 VITALS — BP 120/70 | HR 61 | Temp 98.5°F | Ht 65.0 in | Wt 326.2 lb

## 2017-08-14 DIAGNOSIS — S99922A Unspecified injury of left foot, initial encounter: Secondary | ICD-10-CM

## 2017-08-14 NOTE — Telephone Encounter (Signed)
Pt. Is concerned about the bruising since she is on a blood thinner. Reason for Disposition . [1] MODERATE pain (e.g., interferes with normal activities, limping) AND [2] present > 3 days  Answer Assessment - Initial Assessment Questions 1. ONSET: "When did the pain start?"      Started last night 2. LOCATION: "Where is the pain located?"      Top of foot 3. PAIN: "How bad is the pain?"    (Scale 1-10; or mild, moderate, severe)   -  MILD (1-3): doesn't interfere with normal activities    -  MODERATE (4-7): interferes with normal activities (e.g., work or school) or awakens from sleep, limping    -  SEVERE (8-10): excruciating pain, unable to do any normal activities, unable to walk     2 4. WORK OR EXERCISE: "Has there been any recent work or exercise that involved this part of the body?"      No 5. CAUSE: "What do you think is causing the foot pain?"     Dropped a tray on her foot 6. OTHER SYMPTOMS: "Do you have any other symptoms?" (e.g., leg pain, rash, fever, numbness)     Bruised with a knot in the middle of the bruise 7. PREGNANCY: "Is there any chance you are pregnant?" "When was your last menstrual period?"     No  Protocols used: FOOT PAIN-A-AH

## 2017-08-14 NOTE — Progress Notes (Signed)
Dr. Frederico Hamman T. Kalista Laguardia, MD, [facility] [place], [address] Phone: [phone] Fax: [phone]  08/14/2017  Patient: Amber Barnes, MRN: [id number], DOB: October 29, 1950, 67 y.o.  Primary Physician:  [name], FNP   Chief Complaint  Patient presents with  . Foot Injury    Left-Dropped wooden tray on top of foot last night   Subjective:   Amber Barnes is a 67 y.o. very pleasant female patient who presents with the following:  DOI 08/13/2017  The patient was at home and dropped a wooden tray on her foot last night.  She has had a little bit of some bruising and swelling on the dorsum of her foot.  She spoke to the nursing staff this morning, who were a little bit concerned given that the patient takes some Eliquis.  She is able to ambulate, but she is ambulating with a significant limp.  She had an achilles tendolysis and gastroc recession done by podiatry 01/2017.  Past Medical History, Surgical History, Social History, Family History, Problem List, Medications, and Allergies have been reviewed and updated if relevant.  Patient Active Problem List   Diagnosis Date Noted  . Migraine with aura and without status migrainosus, not intractable 06/27/2017  . Bradycardia 05/22/2017  . Depression, recurrent ([place]) 05/22/2017  . Restless leg 04/13/2017  . OSA (obstructive sleep apnea) 01/09/2017  . Acute pain of left shoulder 11/28/2016  . Persistent atrial fibrillation ([place]) 08/11/2016  . Acute pulmonary edema ([facility])   . New onset a-fib ([place]) 07/28/2016  . Hypokalemia 07/28/2016  . Left foot pain 07/28/2016  . Atypical chest pain   . Fatty liver 06/30/2016  . Dizziness 06/08/2016  . Family history of ovarian cancer 06/08/2016  . GERD (gastroesophageal reflux disease) 05/05/2016  . Seasonal rhinitis 05/05/2016  . Routine physical examination 05/05/2016  . Heel spur 11/05/2015  . Tendonitis 11/05/2015  . SOB  (shortness of breath) 11/11/2014  . Morbid obesity ([place]) 11/11/2014  . Mixed hyperlipidemia 11/11/2014  . Essential hypertension 11/11/2014  . Anxiety and depression 11/11/2014  . Arthritis, senescent 11/11/2014  . Weight loss due to medication 11/11/2014  . Obesity, Class III, BMI 40-49.9 (morbid obesity) ([place]) 04/11/2013    Past Medical History:  Diagnosis Date  . Achilles tendinitis   . Acute medial meniscal tear   . Allergy   . Anxiety   . Arthritis   . Atrial fibrillation ([place])    a. new onset 07/2016; b. CHADS2VASc --> 2 (HTN, female); c. eliquis  . CHF (congestive heart failure) ([place])   . Chronic lower back pain   . COPD (chronic obstructive pulmonary disease) ([place])   . Depression   . Fibrocystic breast disease   . GERD (gastroesophageal reflux disease)   . Hyperlipidemia   . Hypertension   . Insomnia   . Lipoma   . Migraine   . Osteoarthritis    left knee  . Osteopenia   . Tendonitis   . Thyroid disease   . Vitamin D deficiency     Past Surgical History:  Procedure Laterality Date  . ABDOMINAL HYSTERECTOMY     ovaries left  . ACHILLES TENDON SURGERY     2012,01/2017  . BACK SURGERY    . CARDIOVERSION N/A 09/07/2016   Procedure: CARDIOVERSION;  Surgeon: Minna Merritts, MD;  Location: [facility] ORS;  Service: Cardiovascular;  Laterality: N/A;  . CARDIOVERSION N/A 10/19/2016   Procedure: CARDIOVERSION;  Surgeon: [name]  [name], MD;  Location: [facility] ORS;  Service: Cardiovascular;  Laterality: N/A;  . CARDIOVERSION N/A 12/14/2016   Procedure: CARDIOVERSION;  Surgeon: Minna Merritts, MD;  Location: [facility] ORS;  Service: Cardiovascular;  Laterality: N/A;  . COLONOSCOPY    . ESOPHAGOGASTRODUODENOSCOPY    . THYROIDECTOMY     thyroid lobectomy secondary to a benign nodule    Social History   Socioeconomic History  . Marital status: Married    Spouse name: Not on file  . Number of children: 1  . Years of education: Not on file  . Highest education level: High  school graduate  Social Needs  . Financial resource strain: Not on file  . Food insecurity - worry: Not on file  . Food insecurity - inability: Not on file  . Transportation needs - medical: Not on file  . Transportation needs - non-medical: Not on file  Occupational History  . Not on file  Tobacco Use  . Smoking status: Former Smoker    Packs/day: 0.25    Years: 10.00    Pack years: 2.50    Types: Cigarettes    Last attempt to quit: 2005    Years since quitting: 14.1  . Smokeless tobacco: Former Systems developer    Quit date: 07/11/2001  . Tobacco comment: smoked for 10 years, 1 pack every 2-3 days  Substance and Sexual Activity  . Alcohol use: Yes    Comment: social, occasional  . Drug use: No  . Sexual activity: Not on file  Other Topics Concern  . Not on file  Social History Narrative   Moved to [place] from [place], originally from [place].    1 cup of caffeine daily   Right handed           Family History  Problem Relation Age of Onset  . Cancer Sister 7       ovarian  . Cancer Father 73       colon  . Heart disease Father   . Heart attack Father 68       died from MI  . Hyperlipidemia Father   . Hypertension Father   . Cancer Mother        leukemia  . Breast cancer Paternal Aunt        7's  . Migraines Neg Hx     Allergies  Allergen Reactions  . Prednisone Other (See Comments)    Agitation, hyperactivity, polyphagia  . Hydrocodone-Acetaminophen Other (See Comments)    Hallucinations and combative  . Oxycodone Itching  . Adhesive [Tape] Itching and Rash  . Latex Itching and Rash    use paper tape    Medication list reviewed and updated in full in [facility].  GEN: No fevers, chills. Nontoxic. Primarily MSK c/o today. MSK: Detailed in the HPI GI: tolerating PO intake without difficulty Neuro: No numbness, parasthesias, or tingling associated. Otherwise the pertinent positives of the ROS are noted above.   Objective:   BP  120/70   Pulse 61   Temp 98.5 F (36.9 C) (Oral)   Ht 5\' 5"  (1.651 m)   Wt (!) 326 lb 4 oz (148 kg)   BMI 54.29 kg/m    GEN: WDWN, NAD, Non-toxic, Alert & Oriented x 3 HEENT: Atraumatic, Normocephalic.  Ears and Nose: No external deformity. EXTR: No clubbing/cyanosis/edema NEURO: Normal gait.  PSYCH: Normally interactive. Conversant. Not depressed or anxious appearing.  Calm demeanor.    There is some significant bruising and  swelling on the dorsum of the patient's foot.  There is no tenderness in all of the phalanges.  The metatarsals are nontender including the base of the fifth metatarsal.  The tibia and fibula are nontender throughout, and the medial lateral malleoli are nontender.  Patient does have fairly extensive scarring on the Achilles as well as some scarring more anterolateral on that ankle. Dorsum of the midfoot is tender to palpation.  Radiology: Dg Foot Complete Left  Result Date: 08/15/2017 CLINICAL DATA:  Dropped tray on left foot last night. Left foot pain and swelling. Initial encounter. EXAM: LEFT FOOT - COMPLETE 3+ VIEW COMPARISON:  None. FINDINGS: There is no evidence of acute fracture or dislocation. Soft tissue swelling seen along the dorsal aspect of the midfoot. A prominent plantar calcaneal bone spur is noted. Soft tissue ossification is also seen along the superior margin of the calcaneal tuberosity, likely due to old trauma. IMPRESSION: Dorsal midfoot soft tissue swelling. No evidence of acute fracture or dislocation. Electronically Signed   By: Earle Gell M.D.   On: 08/15/2017 08:15    The radiological images were independently reviewed by myself in the office and results were reviewed with the patient. My independent interpretation of images:  There is significant calcification at the achilles insertion, likely post-traumatic vs post-operative. There is a prominent plantar calcaneal spur. At the proximal medial cuneiform and the distal navicular on the lateral  there is subtle change which could represent old trauma, chronic degenerative changes vs. Subtle small fracture. At the base of the 5th MT, there is calcific changes at the insertion of peroneal tendon chronically - nontender currrently.  Electronically Signed  By: Owens Loffler, MD On: 08/15/2017 1:21 PM   Assessment and Plan:   Foot injury, left, initial encounter - Plan: DG Foot Complete Left  I reviewed the plain films with the patient. Point of maximal tenderness correlates to my ? On plain films, and I placed her in a post-op shoe with close follow-up.  Follow-up: Return in about 2 weeks (around 08/28/2017) for f/u Dr. Lorelei Pont.  Orders Placed This Encounter  Procedures  . DG Foot Complete Left    Signed,  Bueford Arp T. Kaydie Petsch, MD   Allergies as of 08/14/2017      Reactions   Prednisone Other (See Comments)   Agitation, hyperactivity, polyphagia   Hydrocodone-acetaminophen Other (See Comments)   Hallucinations and combative   Oxycodone Itching   Adhesive [tape] Itching, Rash   Latex Itching, Rash   use paper tape      Medication List        Accurate as of 08/14/17 11:59 PM. Always use your most recent med list.          ALPRAZolam 0.25 MG tablet Commonly known as:  XANAX TAKE 1 TABLET BY MOUTH 3 TIMES A DAY AS NEEDED FOR ANXIETY   amiodarone 200 MG tablet Commonly known as:  PACERONE Take 1 tablet (200 mg total) by mouth daily.   bisoprolol 5 MG tablet Commonly known as:  ZEBETA Take 1 tablet (5 mg total) by mouth daily.   busPIRone 10 MG tablet Commonly known as:  BUSPAR Take 1 tablet (10 mg total) by mouth 2 (two) times daily.   calcium gluconate 500 MG tablet Take 500 tablets by mouth daily.   ELIQUIS 5 MG Tabs tablet Generic drug:  apixaban TAKE 1 TABLET BY MOUTH TWICE A DAY   esomeprazole 20 MG capsule Commonly known as:  NEXIUM Take 20 mg by mouth  daily as needed (acid reflux).   fluvoxaMINE 50 MG tablet Commonly known as:  LUVOX Take 1 tablet  (50 mg total) by mouth at bedtime.   furosemide 20 MG tablet Commonly known as:  LASIX Take 1 tablet (20 mg total) by mouth 2 (two) times daily.   gabapentin 100 MG capsule Commonly known as:  NEURONTIN TAKE 2 CAPSULES BY MOUTH TWICE A DAY   losartan 25 MG tablet Commonly known as:  COZAAR TAKE 1 TABLET BY MOUTH EVERY DAY   multivitamin tablet Take 1 tablet by mouth daily.   omeprazole 20 MG capsule Commonly known as:  PRILOSEC Take 20 mg by mouth daily.   oxymetazoline 0.05 % nasal spray Commonly known as:  AFRIN Place 1 spray into both nostrils at bedtime as needed for congestion.   potassium chloride 10 MEQ tablet Commonly known as:  KLOR-CON 10 Take 1 tablet (10 mEq total) by mouth 2 (two) times daily.   RANEXA 500 MG 12 hr tablet Generic drug:  ranolazine TAKE 1 TABLET BY MOUTH TWICE A DAY   rizatriptan 10 MG tablet Commonly known as:  MAXALT Take 1 tablet (10 mg total) by mouth as needed for migraine. May repeat in 2 hours if needed   rosuvastatin 20 MG tablet Commonly known as:  CRESTOR TAKE 1 TABLET BY MOUTH EVERY DAY   sennosides-docusate sodium 8.6-50 MG tablet Commonly known as:  SENOKOT-S Take 1 tablet by mouth at bedtime as needed for constipation.   VITAMIN B COMPLEX PO Take 1 tablet by mouth daily.   Vitamin D3 5000 units Caps Take 5,000 Units by mouth daily.

## 2017-08-15 ENCOUNTER — Encounter: Payer: Self-pay | Admitting: Family Medicine

## 2017-08-16 ENCOUNTER — Other Ambulatory Visit: Payer: Self-pay | Admitting: Family Medicine

## 2017-08-17 ENCOUNTER — Ambulatory Visit: Payer: Medicare Other | Admitting: Psychology

## 2017-08-17 DIAGNOSIS — F418 Other specified anxiety disorders: Secondary | ICD-10-CM

## 2017-08-17 DIAGNOSIS — F322 Major depressive disorder, single episode, severe without psychotic features: Secondary | ICD-10-CM | POA: Diagnosis not present

## 2017-08-17 NOTE — Telephone Encounter (Signed)
Pt wanted to call and let Drue Dun know that she does have enough pills for today.

## 2017-08-17 NOTE — Telephone Encounter (Signed)
Caller name: Alaisa Moffitt Relationship to patient: self Can be reached: [phone] Pharmacy:  [facility] [place] [phone] (Phone) [phone] (Fax)   Reason for call: pt called to f/u on RX refill stating pharmacy told her to contact provider - pt states she is out of meds

## 2017-08-18 NOTE — Telephone Encounter (Signed)
faxed

## 2017-08-18 NOTE — Telephone Encounter (Addendum)
lov 06/27/17  next office visit 08/23/17 Last refill 08/09/17

## 2017-08-23 ENCOUNTER — Ambulatory Visit: Payer: Medicare Other | Admitting: Family

## 2017-08-23 ENCOUNTER — Other Ambulatory Visit: Payer: Self-pay | Admitting: Neurology

## 2017-08-23 DIAGNOSIS — G43009 Migraine without aura, not intractable, without status migrainosus: Secondary | ICD-10-CM

## 2017-08-24 ENCOUNTER — Ambulatory Visit: Payer: Medicare Other | Admitting: Family

## 2017-08-24 ENCOUNTER — Ambulatory Visit: Payer: Medicare Other | Admitting: Psychology

## 2017-08-24 DIAGNOSIS — F418 Other specified anxiety disorders: Secondary | ICD-10-CM | POA: Diagnosis not present

## 2017-08-24 DIAGNOSIS — F322 Major depressive disorder, single episode, severe without psychotic features: Secondary | ICD-10-CM

## 2017-08-25 ENCOUNTER — Ambulatory Visit (INDEPENDENT_AMBULATORY_CARE_PROVIDER_SITE_OTHER): Payer: Medicare Other | Admitting: Family

## 2017-08-25 ENCOUNTER — Encounter: Payer: Self-pay | Admitting: Family

## 2017-08-25 VITALS — BP 118/78 | HR 60 | Temp 97.6°F | Resp 18 | Wt 329.0 lb

## 2017-08-25 DIAGNOSIS — E782 Mixed hyperlipidemia: Secondary | ICD-10-CM

## 2017-08-25 DIAGNOSIS — F329 Major depressive disorder, single episode, unspecified: Secondary | ICD-10-CM

## 2017-08-25 DIAGNOSIS — F419 Anxiety disorder, unspecified: Secondary | ICD-10-CM

## 2017-08-25 DIAGNOSIS — I700 Atherosclerosis of aorta: Secondary | ICD-10-CM | POA: Diagnosis not present

## 2017-08-25 DIAGNOSIS — I10 Essential (primary) hypertension: Secondary | ICD-10-CM

## 2017-08-25 DIAGNOSIS — G43109 Migraine with aura, not intractable, without status migrainosus: Secondary | ICD-10-CM | POA: Diagnosis not present

## 2017-08-25 DIAGNOSIS — F32A Depression, unspecified: Secondary | ICD-10-CM

## 2017-08-25 MED ORDER — ALPRAZOLAM 0.25 MG PO TABS: ORAL_TABLET | ORAL | 1 refills | Status: DC

## 2017-08-25 NOTE — Patient Instructions (Addendum)
Be very cautious about using xanax, try to decrease to twice per day as oppsed to three times per day.  F/u 3 months  Labs next week, fasting   Generalized Anxiety Disorder, Adult Generalized anxiety disorder (GAD) is a mental health disorder. People with this condition constantly worry about everyday events. Unlike normal anxiety, worry related to GAD is not triggered by a specific event. These worries also do not fade or get better with time. GAD interferes with life functions, including relationships, work, and school. GAD can vary from mild to severe. People with severe GAD can have intense waves of anxiety with physical symptoms (panic attacks). What are the causes? The exact cause of GAD is not known. What increases the risk? This condition is more likely to develop in:  Women.  People who have a family history of anxiety disorders.  People who are very shy.  People who experience very stressful life events, such as the death of a loved one.  People who have a very stressful family environment.  What are the signs or symptoms? People with GAD often worry excessively about many things in their lives, such as their health and family. They may also be overly concerned about:  Doing well at work.  Being on time.  Natural disasters.  Friendships.  Physical symptoms of GAD include:  Fatigue.  Muscle tension or having muscle twitches.  Trembling or feeling shaky.  Being easily startled.  Feeling like your heart is pounding or racing.  Feeling out of breath or like you cannot take a deep breath.  Having trouble falling asleep or staying asleep.  Sweating.  Nausea, diarrhea, or irritable bowel syndrome (IBS).  Headaches.  Trouble concentrating or remembering facts.  Restlessness.  Irritability.  How is this diagnosed? Your health care provider can diagnose GAD based on your symptoms and medical history. You will also have a physical exam. The health care  provider will ask specific questions about your symptoms, including how severe they are, when they started, and if they come and go. Your health care provider may ask you about your use of alcohol or drugs, including prescription medicines. Your health care provider may refer you to a mental health specialist for further evaluation. Your health care provider will do a thorough examination and may perform additional tests to rule out other possible causes of your symptoms. To be diagnosed with GAD, a person must have anxiety that:  Is out of his or her control.  Affects several different aspects of his or her life, such as work and relationships.  Causes distress that makes him or her unable to take part in normal activities.  Includes at least three physical symptoms of GAD, such as restlessness, fatigue, trouble concentrating, irritability, muscle tension, or sleep problems.  Before your health care provider can confirm a diagnosis of GAD, these symptoms must be present more days than they are not, and they must last for six months or longer. How is this treated? The following therapies are usually used to treat GAD:  Medicine. Antidepressant medicine is usually prescribed for long-term daily control. Antianxiety medicines may be added in severe cases, especially when panic attacks occur.  Talk therapy (psychotherapy). Certain types of talk therapy can be helpful in treating GAD by providing support, education, and guidance. Options include: ? Cognitive behavioral therapy (CBT). People learn coping skills and techniques to ease their anxiety. They learn to identify unrealistic or negative thoughts and behaviors and to replace them with positive ones. ?  Acceptance and commitment therapy (ACT). This treatment teaches people how to be mindful as a way to cope with unwanted thoughts and feelings. ? Biofeedback. This process trains you to manage your body's response (physiological response) through  breathing techniques and relaxation methods. You will work with a therapist while machines are used to monitor your physical symptoms.  Stress management techniques. These include yoga, meditation, and exercise.  A mental health specialist can help determine which treatment is best for you. Some people see improvement with one type of therapy. However, other people require a combination of therapies. Follow these instructions at home:  Take over-the-counter and prescription medicines only as told by your health care provider.  Try to maintain a normal routine.  Try to anticipate stressful situations and allow extra time to manage them.  Practice any stress management or self-calming techniques as taught by your health care provider.  Do not punish yourself for setbacks or for not making progress.  Try to recognize your accomplishments, even if they are small.  Keep all follow-up visits as told by your health care provider. This is important. Contact a health care provider if:  Your symptoms do not get better.  Your symptoms get worse.  You have signs of depression, such as: ? A persistently sad, cranky, or irritable mood. ? Loss of enjoyment in activities that used to bring you joy. ? Change in weight or eating. ? Changes in sleeping habits. ? Avoiding friends or family members. ? Loss of energy for normal tasks. ? Feelings of guilt or worthlessness. Get help right away if:  You have serious thoughts about hurting yourself or others. If you ever feel like you may hurt yourself or others, or have thoughts about taking your own life, get help right away. You can go to your nearest emergency department or call:  Your local emergency services (911 in the [place]  A suicide crisis helpline, such as the [place] at 872-199-6977. This is open 24 hours a day.  Summary  Generalized anxiety disorder (GAD) is a mental health disorder that involves worry  that is not triggered by a specific event.  People with GAD often worry excessively about many things in their lives, such as their health and family.  GAD may cause physical symptoms such as restlessness, trouble concentrating, sleep problems, frequent sweating, nausea, diarrhea, headaches, and trembling or muscle twitching.  A mental health specialist can help determine which treatment is best for you. Some people see improvement with one type of therapy. However, other people require a combination of therapies. This information is not intended to replace advice given to you by your health care provider. Make sure you discuss any questions you have with your health care provider. Document Released: 10/22/2012 Document Revised: 05/17/2016 Document Reviewed: 05/17/2016 Elsevier Interactive Patient Education  [employer].

## 2017-08-25 NOTE — Assessment & Plan Note (Signed)
Improved. Has seen Dr Jaynee Eagles. Has maxalt for rescue.

## 2017-08-25 NOTE — Progress Notes (Signed)
Subjective:    Patient ID: Amber Barnes, female    DOB: 1950/11/23, 67 y.o.   MRN: [id number]  CC: Amber Barnes is a 67 y.o. female who presents today for follow up.   HPI: HLD- compliant crestor  HTN- improved on xanax. At home, ranges 118/71.   Afib- decreased amiordone at last visit with [facility]  Anxiety and depression- taking xanax TID. Not feeling drowsy. seeing therapist, Ceasar Lund,  in office. Given small amount of xanax and luvox 06/27/17 .Stopped ativan. Noticed blood pressure has improved as well. buspar 5mg  qhs. Finds getting dressed more. Started massage therapy, essential oils which has also helped. Sleeping well. Couldn't tell differcen with buspar. No thoughts of hurting herself or anyone else. Stresses about when prescriptions runs out.   Headaches- has seen Dr Jaynee Eagles; HA's have improved on xanax. Thinks anxiety are trigger. Had used maxalt as rescue, hasn't had to use since starting xanax.    ED chest pain 06/26/17. Atherosclerosis seen on CXR. Neg topronin x 2. ekg showed rate of 52.   In walking boot for left foot for another week, pain is improving, 'trying to stay off of it. '    HISTORY:  Past Medical History:  Diagnosis Date  . Achilles tendinitis   . Acute medial meniscal tear   . Allergy   . Anxiety   . Arthritis   . Atrial fibrillation ([place])    a. new onset 07/2016; b. CHADS2VASc --> 2 (HTN, female); c. eliquis  . CHF (congestive heart failure) ([place])   . Chronic lower back pain   . COPD (chronic obstructive pulmonary disease) ([place])   . Depression   . Fibrocystic breast disease   . GERD (gastroesophageal reflux disease)   . Hyperlipidemia   . Hypertension   . Insomnia   . Lipoma   . Migraine   . Osteoarthritis    left knee  . Osteopenia   . Tendonitis   . Thyroid disease   . Vitamin D deficiency    Past Surgical History:  Procedure Laterality Date  . ABDOMINAL HYSTERECTOMY     ovaries left  . ACHILLES TENDON SURGERY     2012,01/2017    . BACK SURGERY    . CARDIOVERSION N/A 09/07/2016   Procedure: CARDIOVERSION;  Surgeon: Minna Merritts, MD;  Location: [facility] ORS;  Service: Cardiovascular;  Laterality: N/A;  . CARDIOVERSION N/A 10/19/2016   Procedure: CARDIOVERSION;  Surgeon: Minna Merritts, MD;  Location: [facility] ORS;  Service: Cardiovascular;  Laterality: N/A;  . CARDIOVERSION N/A 12/14/2016   Procedure: CARDIOVERSION;  Surgeon: Minna Merritts, MD;  Location: [facility] ORS;  Service: Cardiovascular;  Laterality: N/A;  . COLONOSCOPY    . ESOPHAGOGASTRODUODENOSCOPY    . THYROIDECTOMY     thyroid lobectomy secondary to a benign nodule   Family History  Problem Relation Age of Onset  . Cancer Sister 44       ovarian  . Cancer Father 58       colon  . Heart disease Father   . Heart attack Father 93       died from MI  . Hyperlipidemia Father   . Hypertension Father   . Cancer Mother        leukemia  . Breast cancer Paternal Aunt        38's  . Migraines Neg Hx     Allergies: Prednisone; Hydrocodone-acetaminophen; Oxycodone; Adhesive [tape]; and Latex Current Outpatient Medications on File Prior to Visit  Medication Sig Dispense Refill  .  amiodarone (PACERONE) 200 MG tablet Take 1 tablet (200 mg total) by mouth daily. 90 tablet 3  . B Complex Vitamins (VITAMIN B COMPLEX PO) Take 1 tablet by mouth daily.     . bisoprolol (ZEBETA) 5 MG tablet Take 1 tablet (5 mg total) by mouth daily. 90 tablet 3  . calcium gluconate 500 MG tablet Take 500 tablets by mouth daily.    . Cholecalciferol (VITAMIN D3) 5000 units CAPS Take 5,000 Units by mouth daily.    Marland Kitchen ELIQUIS 5 MG TABS tablet TAKE 1 TABLET BY MOUTH TWICE A DAY 60 tablet 3  . esomeprazole (NEXIUM) 20 MG capsule Take 20 mg by mouth daily as needed (acid reflux).    . fluvoxaMINE (LUVOX) 50 MG tablet Take 1 tablet (50 mg total) by mouth at bedtime. 30 tablet 2  . furosemide (LASIX) 20 MG tablet Take 1 tablet (20 mg total) by mouth 2 (two) times daily. (Patient taking  differently: Take 20 mg by mouth daily. ) 180 tablet 3  . gabapentin (NEURONTIN) 100 MG capsule TAKE 2 CAPSULES BY MOUTH TWICE A DAY 90 capsule 0  . losartan (COZAAR) 25 MG tablet TAKE 1 TABLET BY MOUTH EVERY DAY 30 tablet 0  . Multiple Vitamin (MULTIVITAMIN) tablet Take 1 tablet by mouth daily.    Marland Kitchen oxymetazoline (AFRIN) 0.05 % nasal spray Place 1 spray into both nostrils at bedtime as needed for congestion.     . potassium chloride (KLOR-CON 10) 10 MEQ tablet Take 1 tablet (10 mEq total) by mouth 2 (two) times daily. (Patient taking differently: Take 10 mEq by mouth daily. ) 60 tablet 2  . RANEXA 500 MG 12 hr tablet TAKE 1 TABLET BY MOUTH TWICE A DAY 60 tablet 2  . rizatriptan (MAXALT) 10 MG tablet TAKE 1 TABLET (10 MG TOTAL) BY MOUTH AS NEEDED FOR MIGRAINE. MAY REPEAT IN 2 HOURS IF NEEDED 10 tablet 0  . rosuvastatin (CRESTOR) 20 MG tablet TAKE 1 TABLET BY MOUTH EVERY DAY 90 tablet 1  . sennosides-docusate sodium (SENOKOT-S) 8.6-50 MG tablet Take 1 tablet by mouth at bedtime as needed for constipation.      No current facility-administered medications on file prior to visit.     Social History   Tobacco Use  . Smoking status: Former Smoker    Packs/day: 0.25    Years: 10.00    Pack years: 2.50    Types: Cigarettes    Last attempt to quit: 2005    Years since quitting: 14.1  . Smokeless tobacco: Former Systems developer    Quit date: 07/11/2001  . Tobacco comment: smoked for 10 years, 1 pack every 2-3 days  Substance Use Topics  . Alcohol use: Yes    Comment: social, occasional  . Drug use: No    Review of Systems  Constitutional: Negative for chills and fever.  Respiratory: Negative for cough.   Cardiovascular: Negative for chest pain and palpitations.  Gastrointestinal: Negative for nausea and vomiting.  Neurological: Positive for headaches (improved).  Psychiatric/Behavioral: Negative for sleep disturbance and suicidal ideas. The patient is nervous/anxious.       Objective:    BP  118/78 (BP Location: Left Arm, Patient Position: Sitting, Cuff Size: Large)   Pulse 60   Temp 97.6 F (36.4 C) (Oral)   Resp 18   Wt (!) 329 lb (149.2 kg)   SpO2 96%   BMI 54.75 kg/m  BP Readings from Last 3 Encounters:  08/25/17 118/78  08/14/17 120/70  07/24/17  122/80   Wt Readings from Last 3 Encounters:  08/25/17 (!) 329 lb (149.2 kg)  08/14/17 (!) 326 lb 4 oz (148 kg)  07/24/17 (!) 323 lb 4 oz (146.6 kg)    Physical Exam  Constitutional: She appears well-developed and well-nourished.  Eyes: Conjunctivae are normal.  Cardiovascular: Normal rate, regular rhythm, normal heart sounds and normal pulses.  Pulmonary/Chest: Effort normal and breath sounds normal. She has no wheezes. She has no rhonchi. She has no rales.  Musculoskeletal:  Left foot walking boot  Neurological: She is alert.  Skin: Skin is warm and dry.  Psychiatric: She has a normal mood and affect. Her speech is normal and behavior is normal. Thought content normal.  Vitals reviewed.      Assessment & Plan:   Problem List Items Addressed This Visit      Cardiovascular and Mediastinum   Essential hypertension    At goal. Continue current regimen.       Relevant Orders   Comprehensive metabolic panel   Migraine with aura and without status migrainosus, not intractable    Improved. Has seen Dr Jaynee Eagles. Has maxalt for rescue.       Atherosclerosis of aorta (HCC)    Pending lipid panel to ensure at goal.         Other   Mixed hyperlipidemia   Relevant Orders   Lipid panel   Anxiety and depression - Primary    Improving. Patient and I discussed all the measures she is taking to improve anxiety, depression. Will maintain on luvx and advised to try to cut back on xanax - to more of twice daily. The goal to use rarely , which patient understood. Since prescription itself was a stressor, I have given her a month supply of xanax with one refill.  Discussed long term risks of BZDs, overdose, and addiction  today. Controlled substance and suicide contract in place. I looked up patient on [place] Controlled Substances Reporting System and saw no activity that raised concern of inappropriate use.        Relevant Medications   ALPRAZolam (XANAX) 0.25 MG tablet       I have discontinued Milana Brumbaugh's busPIRone. I have also changed her ALPRAZolam. Additionally, I am having her maintain her multivitamin, B Complex Vitamins (VITAMIN B COMPLEX PO), furosemide, oxymetazoline, sennosides-docusate sodium, Vitamin D3, esomeprazole, calcium gluconate, rosuvastatin, potassium chloride, fluvoxaMINE, amiodarone, bisoprolol, ELIQUIS, RANEXA, losartan, gabapentin, and rizatriptan.   Meds ordered this encounter  Medications  . ALPRAZolam (XANAX) 0.25 MG tablet    Sig: TAKE 1 TABLET BY MOUTH TWO TO THREE TIMES A DAY AS NEEDED FOR ANXIETY    Dispense:  90 tablet    Refill:  1    Not to exceed 5 additional fills before 02/05/2018    Order Specific Question:   Supervising Provider    Answer:   Crecencio Mc [[id number]]    Return precautions given.   Risks, benefits, and alternatives of the medications and treatment plan prescribed today were discussed, and patient expressed understanding.   Education regarding symptom management and diagnosis given to patient on AVS.  Continue to follow with [name], FNP for routine health maintenance.   [name] and I agreed with plan.   [name], FNP

## 2017-08-25 NOTE — Assessment & Plan Note (Addendum)
Improving. Patient and I discussed all the measures she is taking to improve anxiety, depression. Will maintain on luvx and advised to try to cut back on xanax - to more of twice daily. The goal to use rarely , which patient understood. Since prescription itself was a stressor, I have given her a month supply of xanax with one refill.  Discussed long term risks of BZDs, overdose, and addiction today. Controlled substance and suicide contract in place. I looked up patient on [place] Controlled Substances Reporting System and saw no activity that raised concern of inappropriate use.

## 2017-08-25 NOTE — Assessment & Plan Note (Signed)
At goal. Continue current regimen. 

## 2017-08-25 NOTE — Assessment & Plan Note (Signed)
Pending lipid panel to ensure at goal.

## 2017-08-28 ENCOUNTER — Other Ambulatory Visit: Payer: Self-pay | Admitting: Family

## 2017-08-28 DIAGNOSIS — G2581 Restless legs syndrome: Secondary | ICD-10-CM

## 2017-08-31 ENCOUNTER — Encounter (INDEPENDENT_AMBULATORY_CARE_PROVIDER_SITE_OTHER): Payer: Medicare Other

## 2017-09-01 ENCOUNTER — Ambulatory Visit: Payer: Medicare Other | Admitting: Psychology

## 2017-09-01 ENCOUNTER — Ambulatory Visit: Payer: Self-pay

## 2017-09-01 DIAGNOSIS — F322 Major depressive disorder, single episode, severe without psychotic features: Secondary | ICD-10-CM

## 2017-09-01 DIAGNOSIS — F418 Other specified anxiety disorders: Secondary | ICD-10-CM

## 2017-09-04 ENCOUNTER — Other Ambulatory Visit: Payer: Self-pay | Admitting: Family

## 2017-09-07 ENCOUNTER — Ambulatory Visit (INDEPENDENT_AMBULATORY_CARE_PROVIDER_SITE_OTHER): Payer: Medicare Other | Admitting: Family Medicine

## 2017-09-11 ENCOUNTER — Ambulatory Visit: Payer: Self-pay | Admitting: *Deleted

## 2017-09-11 NOTE — Telephone Encounter (Addendum)
Pt reports dizziness x 1 week. Positional, worse when going from sitting to lying; resolves after 1-2 minutes. States intermittent; room spins. States "I have to be careful walking when I first get up." Denies SOB, CP, does report "I always have sinus problems." Denies ear ache, "little stuffy." States staying hydrated. Wishes to see provider at [facility] only.  Appt made for Thursday with J. Kordsmeier. Suggested UC, pt declined. States "I'll go if I get worse." Care advise given per protocol. Instructed pt to make positional changes slowly; to call back if symptoms worsen, any SOB,CP, headache.   Reason for Disposition . [1] MODERATE dizziness (e.g., vertigo; feels very unsteady, interferes with normal activities) AND [2] has NOT been evaluated by physician for this  Answer Assessment - Initial Assessment Questions 1. DESCRIPTION: "Describe your dizziness."     Room spinning at times, worse when  "just lying down." 2. VERTIGO: "Do you feel like either you or the room is spinning or tilting?"      Spinning at times 3. LIGHTHEADED: "Do you feel lightheaded?" (e.g., somewhat faint, woozy, weak upon standing)      4. SEVERITY: "How bad is it?"  "Can you walk?"   - MILD - Feels unsteady but walking normally.   - MODERATE - Feels very unsteady when walking, but not falling; interferes with normal activities (e.g., school, work) .   - SEVERE - Unable to walk without falling (requires assistance).     Moderate, intermittent 5. ONSET:  "When did the dizziness begin?"      1 week ago 6. AGGRAVATING FACTORS: "Does anything make it worse?" (e.g., standing, change in head position)     Lie down 7. CAUSE: "What do you think is causing the dizziness?"     "I always have sinus troubles." 8. RECURRENT SYMPTOM: "Have you had dizziness before?" If so, ask: "When was the last time?" "What happened that time?"      9. OTHER SYMPTOMS: "Do you have any other symptoms?" (e.g., headache, weakness,  numbness, vomiting, earache) "Some sinus problems, stuffy."  Protocols used: DIZZINESS - VERTIGO-A-AH

## 2017-09-12 ENCOUNTER — Ambulatory Visit: Payer: Medicare Other | Admitting: Psychology

## 2017-09-12 DIAGNOSIS — F322 Major depressive disorder, single episode, severe without psychotic features: Secondary | ICD-10-CM | POA: Diagnosis not present

## 2017-09-12 DIAGNOSIS — F418 Other specified anxiety disorders: Secondary | ICD-10-CM | POA: Diagnosis not present

## 2017-09-13 NOTE — Progress Notes (Signed)
Subjective:    Patient ID: Amber Barnes, female    DOB: 03-Apr-1951, 67 y.o.   MRN: [id number]  HPI  Amber Barnes is a 67 year old female who presents today with intermittent dizziness that started one week ago. Sore throat started one day ago. Associated sore throat present. No cough. She denies rhinitis, headache, N/V/D.    Presyncope/syncope associated: No  Evaluation of provoking factors that can cause symptoms below: Change in head position: No Ear pressure: Yes Coughing/sneezing: No Sinus pressure: Yes, sinus drainage: yes Tinnitus: No Valsalva: No Occur with standing/getting out of bed: No Lying down/rolling over in bed/bending neck: No Hearing loss: No Numbness/tingling radiation to left arm/jaw: No  Fluid/PO intake: She reports 6 or 7 glasses/day of water. Monitors BP: She monitors her BP at home. She reports that systolic averages of 606T and diastolic averages of high 70s or low 80s. She does report eating lunchmeat that is high in sodium and tries to increase her water intake to "flush it out" She does report eating out at restaurants every night.   Treatment with H2O2 and ear candles to "clean out her ears" She reports that symptom of dizziness improved after "cleaning ears"  No alleviating or aggravating factors noted.                         Denies N/V, HA, SOB, change in vision, numbness, weakness, abnormal coordination/gait History of cardiac dysfunction: Atrial fibrillation with RVR,  Pulmonary edema taking lasix BID, HTN, Mixed hyperlipidemia She is morbidly obese Other history: Anxiety and depression, currently taking Xanax and is followed by therapist.   December 2018, she was evaluated in the ED with chest pain. Atherosclerosis seen on CXR, negative troponin x 2, EKG indicated rate of 52  She is followed closely by cardiology and was seen for regular follow up with PCP 08/2017.     Review of Systems See HPI Past Medical History:  Diagnosis Date  .  Achilles tendinitis   . Acute medial meniscal tear   . Allergy   . Anxiety   . Arthritis   . Atrial fibrillation ([place])    a. new onset 07/2016; b. CHADS2VASc --> 2 (HTN, female); c. eliquis  . CHF (congestive heart failure) ([place])   . Chronic lower back pain   . COPD (chronic obstructive pulmonary disease) ([place])   . Depression   . Fibrocystic breast disease   . GERD (gastroesophageal reflux disease)   . Hyperlipidemia   . Hypertension   . Insomnia   . Lipoma   . Migraine   . Osteoarthritis    left knee  . Osteopenia   . Tendonitis   . Thyroid disease   . Vitamin D deficiency      Social History   Socioeconomic History  . Marital status: Married    Spouse name: Not on file  . Number of children: 1  . Years of education: Not on file  . Highest education level: High school graduate  Social Needs  . Financial resource strain: Not on file  . Food insecurity - worry: Not on file  . Food insecurity - inability: Not on file  . Transportation needs - medical: Not on file  . Transportation needs - non-medical: Not on file  Occupational History  . Not on file  Tobacco Use  . Smoking status: Former Smoker    Packs/day: 0.25    Years: 10.00    Pack years: 2.50  Types: Cigarettes    Last attempt to quit: 2005    Years since quitting: 14.1  . Smokeless tobacco: Former Systems developer    Quit date: 07/11/2001  . Tobacco comment: smoked for 10 years, 1 pack every 2-3 days  Substance and Sexual Activity  . Alcohol use: Yes    Comment: social, occasional  . Drug use: No  . Sexual activity: Not on file  Other Topics Concern  . Not on file  Social History Narrative   Moved to [place] from [place], originally from [place].    1 cup of caffeine daily   Right handed           Past Surgical History:  Procedure Laterality Date  . ABDOMINAL HYSTERECTOMY     ovaries left  . ACHILLES TENDON SURGERY     2012,01/2017  . BACK SURGERY    . CARDIOVERSION N/A 09/07/2016    Procedure: CARDIOVERSION;  Surgeon: Minna Merritts, MD;  Location: [facility] ORS;  Service: Cardiovascular;  Laterality: N/A;  . CARDIOVERSION N/A 10/19/2016   Procedure: CARDIOVERSION;  Surgeon: Minna Merritts, MD;  Location: [facility] ORS;  Service: Cardiovascular;  Laterality: N/A;  . CARDIOVERSION N/A 12/14/2016   Procedure: CARDIOVERSION;  Surgeon: Minna Merritts, MD;  Location: [facility] ORS;  Service: Cardiovascular;  Laterality: N/A;  . COLONOSCOPY    . ESOPHAGOGASTRODUODENOSCOPY    . THYROIDECTOMY     thyroid lobectomy secondary to a benign nodule    Family History  Problem Relation Age of Onset  . Cancer Sister 66       ovarian  . Cancer Father 18       colon  . Heart disease Father   . Heart attack Father 50       died from MI  . Hyperlipidemia Father   . Hypertension Father   . Cancer Mother        leukemia  . Breast cancer Paternal Aunt        25's  . Migraines Neg Hx     Allergies  Allergen Reactions  . Prednisone Other (See Comments)    Agitation, hyperactivity, polyphagia  . Hydrocodone-Acetaminophen Other (See Comments)    Hallucinations and combative  . Oxycodone Itching  . Adhesive [Tape] Itching and Rash  . Latex Itching and Rash    use paper tape    Current Outpatient Medications on File Prior to Visit  Medication Sig Dispense Refill  . ALPRAZolam (XANAX) 0.25 MG tablet TAKE 1 TABLET BY MOUTH TWO TO THREE TIMES A DAY AS NEEDED FOR ANXIETY 90 tablet 1  . amiodarone (PACERONE) 200 MG tablet Take 1 tablet (200 mg total) by mouth daily. 90 tablet 3  . B Complex Vitamins (VITAMIN B COMPLEX PO) Take 1 tablet by mouth daily.     . bisoprolol (ZEBETA) 5 MG tablet Take 1 tablet (5 mg total) by mouth daily. 90 tablet 3  . calcium gluconate 500 MG tablet Take 500 tablets by mouth daily.    . Cholecalciferol (VITAMIN D3) 5000 units CAPS Take 5,000 Units by mouth daily.    Marland Kitchen ELIQUIS 5 MG TABS tablet TAKE 1 TABLET BY MOUTH TWICE A DAY 60 tablet 3  . esomeprazole  (NEXIUM) 20 MG capsule Take 20 mg by mouth daily as needed (acid reflux).    . fluvoxaMINE (LUVOX) 50 MG tablet Take 1 tablet (50 mg total) by mouth at bedtime. 30 tablet 2  . furosemide (LASIX) 20 MG tablet Take 1 tablet (20  mg total) by mouth 2 (two) times daily. (Patient taking differently: Take 20 mg by mouth daily. ) 180 tablet 3  . gabapentin (NEURONTIN) 100 MG capsule TAKE 2 CAPSULES BY MOUTH TWICE A DAY 120 capsule 2  . losartan (COZAAR) 25 MG tablet TAKE 1 TABLET BY MOUTH EVERY DAY 30 tablet 6  . Multiple Vitamin (MULTIVITAMIN) tablet Take 1 tablet by mouth daily.    Marland Kitchen oxymetazoline (AFRIN) 0.05 % nasal spray Place 1 spray into both nostrils at bedtime as needed for congestion.     . potassium chloride (KLOR-CON 10) 10 MEQ tablet Take 1 tablet (10 mEq total) by mouth 2 (two) times daily. (Patient taking differently: Take 10 mEq by mouth daily. ) 60 tablet 2  . RANEXA 500 MG 12 hr tablet TAKE 1 TABLET BY MOUTH TWICE A DAY 60 tablet 2  . rizatriptan (MAXALT) 10 MG tablet TAKE 1 TABLET (10 MG TOTAL) BY MOUTH AS NEEDED FOR MIGRAINE. MAY REPEAT IN 2 HOURS IF NEEDED 10 tablet 0  . rosuvastatin (CRESTOR) 20 MG tablet TAKE 1 TABLET BY MOUTH EVERY DAY 90 tablet 1  . sennosides-docusate sodium (SENOKOT-S) 8.6-50 MG tablet Take 1 tablet by mouth at bedtime as needed for constipation.      No current facility-administered medications on file prior to visit.     BP 130/82 (BP Location: Left Arm, Patient Position: Sitting, Cuff Size: Large)   Pulse (!) 54   Temp 98.3 F (36.8 C) (Oral)   Wt (!) 328 lb 2 oz (148.8 kg)   SpO2 92%   BMI 54.60 kg/m       Objective:   Physical Exam  Constitutional: She is oriented to person, place, and time. She appears well-developed and well-nourished.  Morbidly obese  HENT:  Right Ear: Tympanic membrane normal.  Left Ear: Tympanic membrane normal.  Nose: Rhinorrhea present. No sinus tenderness. Right sinus exhibits no maxillary sinus tenderness and no  frontal sinus tenderness. Left sinus exhibits no maxillary sinus tenderness and no frontal sinus tenderness.  Mouth/Throat: Mucous membranes are normal. Posterior oropharyngeal erythema present. No oropharyngeal exudate.  Mild nystagmus noted with Marye Round maneuver with dizziness lasting < 20 seconds then resolved.   Eyes: Pupils are equal, round, and reactive to light. No scleral icterus.  Neck: Neck supple.  Cardiovascular: Normal rate, regular rhythm and intact distal pulses.  Pulmonary/Chest: Effort normal and breath sounds normal. She has no wheezes. She has no rales.  Abdominal: Soft. Bowel sounds are normal. There is no tenderness. There is no rebound.  Musculoskeletal: She exhibits no edema.  Lymphadenopathy:    She has no cervical adenopathy.  Neurological: She is alert and oriented to person, place, and time. Coordination normal.  Skin: Skin is warm and dry. No rash noted.  Psychiatric: She has a normal mood and affect. Her behavior is normal. Judgment and thought content normal.      Assessment & Plan:  1. Strep throat Rapid strep is positive today. Will provide amoxicillin today and advised use of salt water gargle, rest, increase fluids, and use tylenol as needed for discomfort. Follow up for further evaluation if no improvement or worsening symptoms. - POCT rapid strep A - amoxicillin (AMOXIL) 500 MG capsule; Take 1 capsule (500 mg total) by mouth 2 (two) times daily.  Dispense: 20 capsule; Refill: 0  2. Benign paroxysmal positional vertigo, unspecified laterality Positive Optician, dispensing with resolution of vertigo < 20 seconds. Provided exercises from Up to Date regarding exercises to  try at home for symptoms which are mild. Further advised that she avoid use of ear candles and aggressive irrigation of cerumen which can exacerbate dizziness. Provided return precautions specifically to follow up if symptom worsens, she experiences N/V, numbness, HA, or fever. She  voiced understanding and agreed with plan.  Delano Metz, FNP-C

## 2017-09-14 ENCOUNTER — Ambulatory Visit (INDEPENDENT_AMBULATORY_CARE_PROVIDER_SITE_OTHER): Payer: Medicare Other | Admitting: Family Medicine

## 2017-09-14 ENCOUNTER — Encounter: Payer: Self-pay | Admitting: Family Medicine

## 2017-09-14 ENCOUNTER — Telehealth: Payer: Self-pay

## 2017-09-14 VITALS — BP 130/82 | HR 54 | Temp 98.3°F | Wt 328.1 lb

## 2017-09-14 DIAGNOSIS — H8110 Benign paroxysmal vertigo, unspecified ear: Secondary | ICD-10-CM

## 2017-09-14 DIAGNOSIS — F419 Anxiety disorder, unspecified: Secondary | ICD-10-CM

## 2017-09-14 DIAGNOSIS — R059 Cough, unspecified: Secondary | ICD-10-CM

## 2017-09-14 DIAGNOSIS — F329 Major depressive disorder, single episode, unspecified: Secondary | ICD-10-CM

## 2017-09-14 DIAGNOSIS — R05 Cough: Secondary | ICD-10-CM

## 2017-09-14 DIAGNOSIS — J020 Streptococcal pharyngitis: Secondary | ICD-10-CM | POA: Diagnosis not present

## 2017-09-14 DIAGNOSIS — F32A Depression, unspecified: Secondary | ICD-10-CM

## 2017-09-14 LAB — POCT RAPID STREP A (OFFICE): Rapid Strep A Screen: POSITIVE — AB

## 2017-09-14 MED ORDER — AMOXICILLIN 500 MG PO CAPS: 500.0000 mg | ORAL_CAPSULE | Freq: Two times a day (BID) | ORAL | 0 refills | Status: DC

## 2017-09-14 NOTE — Patient Instructions (Addendum)
Please take medication as directed. Advised patient on supportive measures:  Get rest, drink plenty of fluids, and use tylenol as needed for pain. Follow up if fever >101, if symptoms worsen or if symptoms are not improved in 3 to 4 days. Patient verbalizes understanding.   Please try exercises that were provided to you for vertigo. If no improvement after completion of antibiotic and avoidance of ear candles, follow up with provider for further evaluation and treatment.   Strep Throat Strep throat is an infection of the throat. It is caused by germs. Strep throat spreads from person to person because of coughing, sneezing, or close contact. Follow these instructions at home: Medicines  Take over-the-counter and prescription medicines only as told by your doctor.  Take your antibiotic medicine as told by your doctor. Do not stop taking the medicine even if you feel better.  Have family members who also have a sore throat or fever go to a doctor. Eating and drinking  Do not share food, drinking cups, or personal items.  Try eating soft foods until your sore throat feels better.  Drink enough fluid to keep your pee (urine) clear or pale yellow. General instructions  Rinse your mouth (gargle) with a salt-water mixture 3-4 times per day or as needed. To make a salt-water mixture, stir -1 tsp of salt into 1 cup of warm water.  Make sure that all people in your house wash their hands well.  Rest.  Stay home from school or work until you have been taking antibiotics for 24 hours.  Keep all follow-up visits as told by your doctor. This is important. Contact a doctor if:  Your neck keeps getting bigger.  You get a rash, cough, or earache.  You cough up thick liquid that is green, yellow-brown, or bloody.  You have pain that does not get better with medicine.  Your problems get worse instead of getting better.  You have a fever. Get help right away if:  You throw up  (vomit).  You get a very bad headache.  You neck hurts or it feels stiff.  You have chest pain or you are short of breath.  You have drooling, very bad throat pain, or changes in your voice.  Your neck is swollen or the skin gets red and tender.  Your mouth is dry or you are peeing less than normal.  You keep feeling more tired or it is hard to wake up.  Your joints are red or they hurt. This information is not intended to replace advice given to you by your health care provider. Make sure you discuss any questions you have with your health care provider. Document Released: 12/14/2007 Document Revised: 02/24/2016 Document Reviewed: 10/20/2014 Elsevier Interactive Patient Education  2018 [employer].    Benign Positional Vertigo Vertigo is the feeling that you or your surroundings are moving when they are not. Benign positional vertigo is the most common form of vertigo. The cause of this condition is not serious (is benign). This condition is triggered by certain movements and positions (is positional). This condition can be dangerous if it occurs while you are doing something that could endanger you or others, such as driving. What are the causes? In many cases, the cause of this condition is not known. It may be caused by a disturbance in an area of the inner ear that helps your brain to sense movement and balance. This disturbance can be caused by a viral infection (labyrinthitis), head injury,  or repetitive motion. What increases the risk? This condition is more likely to develop in:  Women.  People who are 49 years of age or older.  What are the signs or symptoms? Symptoms of this condition usually happen when you move your head or your eyes in different directions. Symptoms may start suddenly, and they usually last for less than a minute. Symptoms may include:  Loss of balance and falling.  Feeling like you are spinning or moving.  Feeling like your surroundings are  spinning or moving.  Nausea and vomiting.  Blurred vision.  Dizziness.  Involuntary eye movement (nystagmus).  Symptoms can be mild and cause only slight annoyance, or they can be severe and interfere with daily life. Episodes of benign positional vertigo may return (recur) over time, and they may be triggered by certain movements. Symptoms may improve over time. How is this diagnosed? This condition is usually diagnosed by medical history and a physical exam of the head, neck, and ears. You may be referred to a health care provider who specializes in ear, nose, and throat (ENT) problems (otolaryngologist) or a provider who specializes in disorders of the nervous system (neurologist). You may have additional testing, including:  MRI.  A CT scan.  Eye movement tests. Your health care provider may ask you to change positions quickly while he or she watches you for symptoms of benign positional vertigo, such as nystagmus. Eye movement may be tested with an electronystagmogram (ENG), caloric stimulation, the Dix-Hallpike test, or the roll test.  An electroencephalogram (EEG). This records electrical activity in your brain.  Hearing tests.  How is this treated? Usually, your health care provider will treat this by moving your head in specific positions to adjust your inner ear back to normal. Surgery may be needed in severe cases, but this is rare. In some cases, benign positional vertigo may resolve on its own in 2-4 weeks. Follow these instructions at home: Safety  Move slowly.Avoid sudden body or head movements.  Avoid driving.  Avoid operating heavy machinery.  Avoid doing any tasks that would be dangerous to you or others if a vertigo episode would occur.  If you have trouble walking or keeping your balance, try using a cane for stability. If you feel dizzy or unstable, sit down right away.  Return to your normal activities as told by your health care provider. Ask your health  care provider what activities are safe for you. General instructions  Take over-the-counter and prescription medicines only as told by your health care provider.  Avoid certain positions or movements as told by your health care provider.  Drink enough fluid to keep your urine clear or pale yellow.  Keep all follow-up visits as told by your health care provider. This is important. Contact a health care provider if:  You have a fever.  Your condition gets worse or you develop new symptoms.  Your family or friends notice any behavioral changes.  Your nausea or vomiting gets worse.  You have numbness or a "pins and needles" sensation. Get help right away if:  You have difficulty speaking or moving.  You are always dizzy.  You faint.  You develop severe headaches.  You have weakness in your legs or arms.  You have changes in your hearing or vision.  You develop a stiff neck.  You develop sensitivity to light. This information is not intended to replace advice given to you by your health care provider. Make sure you discuss any questions  you have with your health care provider. Document Released: 04/04/2006 Document Revised: 12/03/2015 Document Reviewed: 10/20/2014 Elsevier Interactive Patient Education  [employer].

## 2017-09-14 NOTE — Telephone Encounter (Signed)
Patient was here to see Almira Coaster , NP for acute visit and while being seen she states she has been taking Buspar . It was discontinued at last visit.   While rooming patient she had panic attack .  She seemed very upset states it had been a bad week . She denied thoughts of hurting her self.  She is currently seeing [facility] specialist in our office.  She states she will need refill of Buspar by this weekend . Please advise.

## 2017-09-15 ENCOUNTER — Other Ambulatory Visit: Payer: Self-pay | Admitting: Family

## 2017-09-15 DIAGNOSIS — F32A Depression, unspecified: Secondary | ICD-10-CM

## 2017-09-15 DIAGNOSIS — F419 Anxiety disorder, unspecified: Secondary | ICD-10-CM

## 2017-09-15 DIAGNOSIS — F329 Major depressive disorder, single episode, unspecified: Secondary | ICD-10-CM

## 2017-09-15 MED ORDER — BENZONATATE 100 MG PO CAPS: 100.0000 mg | ORAL_CAPSULE | Freq: Two times a day (BID) | ORAL | 0 refills | Status: DC | PRN

## 2017-09-15 MED ORDER — FLUVOXAMINE MALEATE 50 MG PO TABS: 100.0000 mg | ORAL_TABLET | Freq: Every day | ORAL | 2 refills | Status: DC

## 2017-09-15 NOTE — Telephone Encounter (Signed)
Spoke with patient  No longer running fever Has taken 3 doses of amoxicillin.  Throat better Coughing now  Taking xanax TID. Still having anxiety attacks. Not sleeping well.  Started taking buspar - although we dced. Advised to stop again.   ON luvox 50mg - started in 06/2017.   Increase luvox to 100mg  qhs. Take 2 tablets  Can tell a difference after talking with osman.   Agreeable to see psychiatry.   No si/hi.

## 2017-09-17 ENCOUNTER — Encounter: Payer: Self-pay | Admitting: Family

## 2017-09-18 ENCOUNTER — Other Ambulatory Visit: Payer: Self-pay | Admitting: Family Medicine

## 2017-09-21 ENCOUNTER — Ambulatory Visit: Payer: Self-pay

## 2017-09-26 ENCOUNTER — Telehealth: Payer: Self-pay | Admitting: Cardiovascular Disease

## 2017-09-26 NOTE — Telephone Encounter (Signed)
° °  [facility] Pre-operative Risk Assessment    Request for surgical clearance:  1. What type of surgery is being performed? Dental Tooth extraction    2. When is this surgery scheduled? TBA  3. What type of clearance is required (medical clearance vs. Pharmacy clearance to hold med vs. Both)? Medical   4. Are there any medications that need to be held prior to surgery and how long? Not listed   5. Practice name and name of physician performing surgery? [facility] in [place]   6. What is your office phone and fax number?  Phone: [phone] Fax:      [phone]   7. Anesthesia type (None, local, MAC, general) ? Not listed

## 2017-09-27 ENCOUNTER — Ambulatory Visit: Payer: Medicare Other | Admitting: Psychology

## 2017-09-27 DIAGNOSIS — F418 Other specified anxiety disorders: Secondary | ICD-10-CM

## 2017-09-27 DIAGNOSIS — F322 Major depressive disorder, single episode, severe without psychotic features: Secondary | ICD-10-CM | POA: Diagnosis not present

## 2017-09-28 ENCOUNTER — Telehealth: Payer: Self-pay

## 2017-09-28 ENCOUNTER — Encounter: Payer: Self-pay | Admitting: Emergency Medicine

## 2017-09-28 ENCOUNTER — Ambulatory Visit: Payer: Medicare Other

## 2017-09-28 ENCOUNTER — Emergency Department
Admission: EM | Admit: 2017-09-28 | Discharge: 2017-09-28 | Disposition: A | Discharge status: Home / Self Care | Payer: Medicare Other | Attending: Emergency Medicine | Admitting: Emergency Medicine

## 2017-09-28 ENCOUNTER — Emergency Department: Payer: Medicare Other

## 2017-09-28 ENCOUNTER — Other Ambulatory Visit: Payer: Self-pay

## 2017-09-28 DIAGNOSIS — Z043 Encounter for examination and observation following other accident: Secondary | ICD-10-CM | POA: Diagnosis present

## 2017-09-28 DIAGNOSIS — Z79899 Other long term (current) drug therapy: Secondary | ICD-10-CM | POA: Insufficient documentation

## 2017-09-28 DIAGNOSIS — I110 Hypertensive heart disease with heart failure: Secondary | ICD-10-CM | POA: Diagnosis not present

## 2017-09-28 DIAGNOSIS — R001 Bradycardia, unspecified: Secondary | ICD-10-CM | POA: Diagnosis not present

## 2017-09-28 DIAGNOSIS — J449 Chronic obstructive pulmonary disease, unspecified: Secondary | ICD-10-CM | POA: Insufficient documentation

## 2017-09-28 DIAGNOSIS — Z87891 Personal history of nicotine dependence: Secondary | ICD-10-CM | POA: Diagnosis not present

## 2017-09-28 DIAGNOSIS — R55 Syncope and collapse: Secondary | ICD-10-CM | POA: Insufficient documentation

## 2017-09-28 DIAGNOSIS — E079 Disorder of thyroid, unspecified: Secondary | ICD-10-CM | POA: Diagnosis not present

## 2017-09-28 DIAGNOSIS — I509 Heart failure, unspecified: Secondary | ICD-10-CM | POA: Diagnosis not present

## 2017-09-28 DIAGNOSIS — Z7901 Long term (current) use of anticoagulants: Secondary | ICD-10-CM | POA: Insufficient documentation

## 2017-09-28 LAB — URINALYSIS, COMPLETE (UACMP) WITH MICROSCOPIC
Bacteria, UA: NONE SEEN
Bilirubin Urine: NEGATIVE
Glucose, UA: NEGATIVE mg/dL
Hgb urine dipstick: NEGATIVE
Ketones, ur: NEGATIVE mg/dL
Leukocytes, UA: NEGATIVE
Nitrite: NEGATIVE
Protein, ur: NEGATIVE mg/dL
Specific Gravity, Urine: 1.008 (ref 1.005–1.030)
pH: 6 (ref 5.0–8.0)

## 2017-09-28 LAB — CBC WITH DIFFERENTIAL/PLATELET
Basophils Absolute: 0 10*3/uL (ref 0–0.1)
Basophils Relative: 1 %
Eosinophils Absolute: 0.1 10*3/uL (ref 0–0.7)
Eosinophils Relative: 3 %
HCT: 41.4 % (ref 35.0–47.0)
Hemoglobin: 13.6 g/dL (ref 12.0–16.0)
Lymphocytes Relative: 29 %
Lymphs Abs: 1.3 10*3/uL (ref 1.0–3.6)
MCH: 29.4 pg (ref 26.0–34.0)
MCHC: 32.7 g/dL (ref 32.0–36.0)
MCV: 89.9 fL (ref 80.0–100.0)
Monocytes Absolute: 0.5 10*3/uL (ref 0.2–0.9)
Monocytes Relative: 11 %
Neutro Abs: 2.5 10*3/uL (ref 1.4–6.5)
Neutrophils Relative %: 56 %
Platelets: 166 10*3/uL (ref 150–440)
RBC: 4.6 MIL/uL (ref 3.80–5.20)
RDW: 14.5 % (ref 11.5–14.5)
WBC: 4.5 10*3/uL (ref 3.6–11.0)

## 2017-09-28 LAB — TROPONIN I: Troponin I: <0.03 ng/mL (ref <0.03)

## 2017-09-28 LAB — BASIC METABOLIC PANEL
Anion gap: 10 (ref 5–15)
BUN: 18 mg/dL (ref 6–20)
CO2: 28 mmol/L (ref 22–32)
Calcium: 8.9 mg/dL (ref 8.9–10.3)
Chloride: 103 mmol/L (ref 101–111)
Creatinine, Ser: 0.95 mg/dL (ref 0.44–1.00)
GFR calc Af Amer: >60 mL/min (ref >60)
GFR calc non Af Amer: >60 mL/min (ref >60)
Glucose, Bld: 96 mg/dL (ref 65–99)
Potassium: 4.2 mmol/L (ref 3.5–5.1)
Sodium: 141 mmol/L (ref 135–145)

## 2017-09-28 NOTE — Telephone Encounter (Signed)
Patient coming into office for annual wellness visit slipped and fell while attempting to enter building. Per patient " I was stepping up on curb to sidewalk and I don't realize was whether I did not step high enough or what happened but fell to left side hitting left arm , left knee and left side of head on hand yo curbing". Patient was brought into the triage room and triaged vitals as listed from [place].  Patient was noted to have abrasion to left posterior arm and to radial side of left hand, no bruising noted at left knee or swelling and no abrasions. Examined scalp for abrasions or contusions none were visibly noted. Patient had full range of motion of all extremities, patient alert , PERRLA, patient aware of self , place and time.  Nurse spoke with MD ( Dr. Caryl Bis ) advised of findings ,MD examined patient due to patient developed some grogginess and advised patient should be evaluated at ED to rule out any head trama, patient agreed and EMS was called.

## 2017-09-28 NOTE — ED Notes (Signed)
Pt assisted to the bathroom by this RN.  

## 2017-09-28 NOTE — ED Notes (Signed)
Dr. Stafford at bedside.  

## 2017-09-28 NOTE — Discharge Instructions (Addendum)
Decrease your bisoprolol to half tablet each night.  If you are still have slow heart rate and dizziness, you can decrease your amiodarone to half-tablet as well.  Follow up with Dr. [name] within 1 week. Return to the ER if you have worsening symptoms, heart rate less than 40, or other concerns.

## 2017-09-28 NOTE — ED Provider Notes (Signed)
[facility] Emergency Department Provider Note  ____________________________________________  Time seen: Approximately 10:20 AM  I have reviewed the triage vital signs and the nursing notes.   HISTORY  Chief Complaint Fall    HPI Amber Barnes is a 67 y.o. female with a history of COPD, hypertension, atrial fibrillation and CHF, who comes to the ED via EMS due to a fall. She had just gotten out of her car and was walking into her primary care clinic when she suddenly fell. She does not remember the event, and agrees that she may have passed out briefly. She also notes that she sometimes gets dizzy with changes in position, particularly with standing up. She takes 3 rate limiting medications including amiodarone, bisoprolol, and Ranexa. Denies any preceding symptoms such as chest pain shortness of breath back pain abdominal pain headache vision changes or focal weakness. After the fall she quickly returned to normal, asymptomatically. Denies palpitations.  she does note that the left side of her face "feels weird" and that she may have hit it on the ground. denies thunderclap headache  Past Medical History:  Diagnosis Date  . Achilles tendinitis   . Acute medial meniscal tear   . Allergy   . Anxiety   . Arthritis   . Atrial fibrillation ([place])    a. new onset 07/2016; b. CHADS2VASc --> 2 (HTN, female); c. eliquis  . CHF (congestive heart failure) ([place])   . Chronic lower back pain   . COPD (chronic obstructive pulmonary disease) ([place])   . Depression   . Fibrocystic breast disease   . GERD (gastroesophageal reflux disease)   . Hyperlipidemia   . Hypertension   . Insomnia   . Lipoma   . Migraine   . Osteoarthritis    left knee  . Osteopenia   . Tendonitis   . Thyroid disease   . Vitamin D deficiency      Patient Active Problem List   Diagnosis Date Noted  . Atherosclerosis of aorta ([place]) 08/25/2017  . Migraine with aura and without status  migrainosus, not intractable 06/27/2017  . Bradycardia 05/22/2017  . Depression, recurrent ([place]) 05/22/2017  . Restless leg 04/13/2017  . OSA (obstructive sleep apnea) 01/09/2017  . Acute pain of left shoulder 11/28/2016  . Persistent atrial fibrillation ([place]) 08/11/2016  . Acute pulmonary edema ([facility])   . New onset a-fib ([place]) 07/28/2016  . Hypokalemia 07/28/2016  . Left foot pain 07/28/2016  . Atypical chest pain   . Fatty liver 06/30/2016  . Dizziness 06/08/2016  . Family history of ovarian cancer 06/08/2016  . GERD (gastroesophageal reflux disease) 05/05/2016  . Seasonal rhinitis 05/05/2016  . Routine physical examination 05/05/2016  . Heel spur 11/05/2015  . Tendonitis 11/05/2015  . SOB (shortness of breath) 11/11/2014  . Morbid obesity ([place]) 11/11/2014  . Mixed hyperlipidemia 11/11/2014  . Essential hypertension 11/11/2014  . Anxiety and depression 11/11/2014  . Arthritis, senescent 11/11/2014  . Weight loss due to medication 11/11/2014  . Obesity, Class III, BMI 40-49.9 (morbid obesity) ([place]) 04/11/2013     Past Surgical History:  Procedure Laterality Date  . ABDOMINAL HYSTERECTOMY     ovaries left  . ACHILLES TENDON SURGERY     2012,01/2017  . BACK SURGERY    . CARDIOVERSION N/A 09/07/2016   Procedure: CARDIOVERSION;  Surgeon: Minna Merritts, MD;  Location: [facility] ORS;  Service: Cardiovascular;  Laterality: N/A;  . CARDIOVERSION N/A 10/19/2016   Procedure: CARDIOVERSION;  Surgeon: Minna Merritts, MD;  Location: [facility] ORS;  Service: Cardiovascular;  Laterality: N/A;  . CARDIOVERSION N/A 12/14/2016   Procedure: CARDIOVERSION;  Surgeon: Minna Merritts, MD;  Location: [facility] ORS;  Service: Cardiovascular;  Laterality: N/A;  . COLONOSCOPY    . ESOPHAGOGASTRODUODENOSCOPY    . THYROIDECTOMY     thyroid lobectomy secondary to a benign nodule     Prior to Admission medications   Medication Sig Start Date End Date Taking? Authorizing Provider  ALPRAZolam (XANAX) 0.25 MG  tablet TAKE 1 TABLET BY MOUTH TWO TO THREE TIMES A DAY AS NEEDED FOR ANXIETY 08/25/17  Yes Arnett, Yvetta Coder, FNP  amiodarone (PACERONE) 200 MG tablet Take 1 tablet (200 mg total) by mouth daily. 07/24/17  Yes Gollan, [name], MD  B Complex Vitamins (VITAMIN B COMPLEX PO) Take 1 tablet by mouth daily.    Yes [provider]  bisoprolol (ZEBETA) 5 MG tablet Take 1 tablet (5 mg total) by mouth daily. 07/24/17  Yes Minna Merritts, MD  calcium gluconate 500 MG tablet Take 500 tablets by mouth daily.   Yes [provider]  Cholecalciferol (VITAMIN D3) 5000 units CAPS Take 5,000 Units by mouth daily.   Yes [provider]  ELIQUIS 5 MG TABS tablet TAKE 1 TABLET BY MOUTH TWICE A DAY 08/07/17  Yes Gollan, [name], MD  fluvoxaMINE (LUVOX) 50 MG tablet Take 2 tablets (100 mg total) by mouth at bedtime. 09/15/17  Yes Arnett, Yvetta Coder, FNP  furosemide (LASIX) 20 MG tablet Take 1 tablet (20 mg total) by mouth 2 (two) times daily. 09/12/16  Yes Minna Merritts, MD  gabapentin (NEURONTIN) 100 MG capsule TAKE 2 CAPSULES BY MOUTH TWICE A DAY 08/29/17  Yes [name], FNP  losartan (COZAAR) 25 MG tablet TAKE 1 TABLET BY MOUTH EVERY DAY 09/06/17  Yes [name], FNP  Multiple Vitamin (MULTIVITAMIN) tablet Take 1 tablet by mouth daily.   Yes [provider]  potassium chloride (KLOR-CON 10) 10 MEQ tablet Take 1 tablet (10 mEq total) by mouth 2 (two) times daily. 05/10/17  Yes Gollan, [name], MD  RANEXA 500 MG 12 hr tablet TAKE 1 TABLET BY MOUTH TWICE A DAY 08/07/17  Yes Gollan, [name], MD  rosuvastatin (CRESTOR) 20 MG tablet TAKE 1 TABLET BY MOUTH EVERY DAY 05/03/17  Yes Arnett, Yvetta Coder, FNP  amoxicillin (AMOXIL) 500 MG capsule Take 1 capsule (500 mg total) by mouth 2 (two) times daily. Patient not taking: Reported on 09/28/2017 09/14/17   Delano Metz, FNP  benzonatate (TESSALON) 100 MG capsule Take 1 capsule (100 mg total) by mouth 2 (two) times daily as  needed for cough. Patient not taking: Reported on 09/28/2017 09/15/17   [name], FNP  esomeprazole (NEXIUM) 20 MG capsule Take 20 mg by mouth daily as needed (acid reflux).    [provider]  oxymetazoline (AFRIN) 0.05 % nasal spray Place 1 spray into both nostrils at bedtime as needed for congestion.     [provider]  rizatriptan (MAXALT) 10 MG tablet TAKE 1 TABLET (10 MG TOTAL) BY MOUTH AS NEEDED FOR MIGRAINE. MAY REPEAT IN 2 HOURS IF NEEDED 08/24/17   Melvenia Beam, MD  sennosides-docusate sodium (SENOKOT-S) 8.6-50 MG tablet Take 1 tablet by mouth at bedtime as needed for constipation.     [provider]     Allergies Prednisone; Hydrocodone-acetaminophen; Oxycodone; Adhesive [tape]; and Latex   Family History  Problem Relation Age of Onset  . Cancer  Sister 59       ovarian  . Cancer Father 64       colon  . Heart disease Father   . Heart attack Father 45       died from MI  . Hyperlipidemia Father   . Hypertension Father   . Cancer Mother        leukemia  . Breast cancer Paternal Aunt        38's  . Migraines Neg Hx     Social History Social History   Tobacco Use  . Smoking status: Former Smoker    Packs/day: 0.25    Years: 10.00    Pack years: 2.50    Types: Cigarettes    Last attempt to quit: 2005    Years since quitting: 14.2  . Smokeless tobacco: Former Systems developer    Quit date: 07/11/2001  . Tobacco comment: smoked for 10 years, 1 pack every 2-3 days  Substance Use Topics  . Alcohol use: Yes    Comment: social, occasional  . Drug use: No    Review of Systems  Constitutional:   No fever or chills.  Cardiovascular:   No chest pain , positive syncope. Respiratory:   No dyspnea or cough. Gastrointestinal:   Negative for abdominal pain, vomiting and diarrhea.  Musculoskeletal:   Negative for focal pain or swelling All other systems reviewed and are negative except as documented above in ROS and  HPI.  ____________________________________________   PHYSICAL EXAM:  VITAL SIGNS: ED Triage Vitals  Enc Vitals Group     BP 09/28/17 0855 (!) 185/86     Pulse Rate 09/28/17 0855 (!) 52     Resp 09/28/17 0855 18     Temp 09/28/17 0855 97.7 F (36.5 C)     Temp Source 09/28/17 0855 Oral     SpO2 09/28/17 0852 98 %     Weight 09/28/17 0856 (!) 324 lb (147 kg)     Height 09/28/17 0856 5\' 5"  (1.651 m)     Head Circumference --      Peak Flow --      Pain Score 09/28/17 0855 7     Pain Loc --      Pain Edu? --      Excl. in [place]? --     Vital signs reviewed, nursing assessments reviewed.   Constitutional:   Alert and oriented. Well appearing and in no distress. Eyes:   No scleral icterus.  EOMI. No nystagmus. No conjunctival pallor. PERRL. ENT   Head:   Normocephalic and atraumatic.   Nose:   No congestion/rhinnorhea. no epistaxis   Mouth/Throat:   MMM, no pharyngeal erythema. No peritonsillar mass. no intraoral injury   Neck:   No meningismus. Full ROM. Hematological/Lymphatic/Immunilogical:   No cervical lymphadenopathy. Cardiovascular:   bradycardia heart rate 45. Symmetric bilateral radial and DP pulses.  No murmurs.  Respiratory:   Normal respiratory effort without tachypnea/retractions. Breath sounds are clear and equal bilaterally. No wheezes/rales/rhonchi. Gastrointestinal:   Soft and nontender. Non distended. There is no CVA tenderness.  No rebound, rigidity, or guarding. Musculoskeletal:   Normal range of motion in all extremities. bilateral hip tenderness.  No edema.no leg shortening or pain with rotation Neurologic:   Normal speech and language.  Motor grossly intact. No acute focal neurologic deficits are appreciated.  Skin:    Skin is warm, dry and intact. No rash noted.  No petechiae, purpura, or bullae.  ____________________________________________    LABS (pertinent positives/negatives) (  all labs ordered are listed, but only abnormal results  are displayed) Labs Reviewed  URINALYSIS, COMPLETE (UACMP) WITH MICROSCOPIC - Abnormal; Notable for the following components:      Result Value   Color, Urine YELLOW (*)    APPearance CLEAR (*)    Squamous Epithelial / LPF 0-5 (*)    All other components within normal limits  BASIC METABOLIC PANEL  CBC WITH DIFFERENTIAL/PLATELET  TROPONIN I   ____________________________________________   EKG  interpreted by me Sinus bradycardia rate of 50, normal axis and intervals. Normal QRS ST segments and T waves.  ____________________________________________    RADIOLOGY  Dg Chest 2 View  Result Date: 09/28/2017 CLINICAL DATA:  Right hip pain status post fall.  Syncope. EXAM: CHEST - 2 VIEW COMPARISON:  CT chest 06/16/2017 Chest x-ray 06/16/2017, 10/11/2011 FINDINGS: There is bilateral mild interstitial prominence likely chronic. There is no focal parenchymal opacity. There is no pleural effusion or pneumothorax. There is mild stable cardiomegaly. The osseous structures are unremarkable. IMPRESSION: No active cardiopulmonary disease. Electronically Signed   By: Kathreen Devoid   On: 09/28/2017 10:19   Dg Pelvis 1-2 Views  Result Date: 09/28/2017 CLINICAL DATA:  Right hip pain post fall. EXAM: PELVIS - 1-2 VIEW COMPARISON:  CT 06/30/2016. FINDINGS: Degenerative changes lumbar spine, both SI joints, both hips. Diffuse osteopenia. No acute abnormality. No evidence of fracture. IMPRESSION: Diffuse degenerative change lumbar spine, both SI joints, both hips. Diffuse osteopenia. No acute abnormality. Electronically Signed   By: Marcello Moores  Register   On: 09/28/2017 10:20   Ct Head Wo Contrast  Result Date: 09/28/2017 CLINICAL DATA:  Trauma. High blood pressure. Hand mechanical fall. Slipped on concrete EXAM: CT HEAD WITHOUT CONTRAST TECHNIQUE: Contiguous axial images were obtained from the base of the skull through the vertex without intravenous contrast. COMPARISON:  None. FINDINGS: Brain: No acute  intracranial hemorrhage. No focal mass lesion. No CT evidence of acute infarction. No midline shift or mass effect. No hydrocephalus. Basilar cisterns are patent. Vascular: No hyperdense vessel or unexpected calcification. Skull: Normal. Negative for fracture or focal lesion. Sinuses/Orbits: Paranasal sinuses and mastoid air cells are clear. Orbits are clear. Other: None. IMPRESSION: 1. No intracranial trauma. 2. Normal head CT for age. Electronically Signed   By: Suzy Bouchard M.D.   On: 09/28/2017 10:03    ____________________________________________   PROCEDURES Procedures  ____________________________________________  DIFFERENTIAL DIAGNOSIS   subdural hematoma, orthostatic hypotension secondary to medicine-induced bradycardia, dehydration  CLINICAL IMPRESSION / ASSESSMENT AND PLAN / ED COURSE  Pertinent labs & imaging results that were available during my care of the patient were reviewed by me and considered in my medical decision making (see chart for details).     Clinical Course as of Sep 28 1145  Thu Sep 28, 2017  0915 P/w fall, suspect syncope. Minor head trauma, on eliquis. Will need CT head to eval for ICH. I think the fall is likely orthostatic, provoked by multiple heart rate-limiting medications with current bradycardia, HR 45 on exam. Will check labs, ekg, cxr, pelvis xr and plan to d/w with her cardiologist Dr. [name].and I doubt sepsis, I doubt ACS PE or dissection.   [PS]  1046 CT head negative for ICH. No subdural or epidural hematoma.  No evidence of fracture on chest xray or pelvis xray. No evidence of heart failure or pulm. Edema.    [PS]  1047 D/w Dr. [name], advises it would be okay to halve amio and/or bisoprolol if she remains bradycardic with  walking.    [PS]  1113 Workup neg. Labs neg. Will do a walk test to eval bradycardia.    [PS]  7373 Still brady/dizzy on standing. Offered admission, prefers to go home. Will halve bisoprolol, f/u cards. Return  precautions given.    [PS]    Clinical Course User Index [PS] Carrie Mew, MD     ____________________________________________   FINAL CLINICAL IMPRESSION(S) / ED DIAGNOSES    Final diagnoses:  Bradycardia  Syncope, unspecified syncope type     ED Discharge Orders    None      Portions of this note were generated with dragon dictation software. Dictation errors may occur despite best attempts at proofreading.    Carrie Mew, MD 09/28/17 1148

## 2017-09-28 NOTE — Telephone Encounter (Signed)
Patient fell while walking into off on left arm caught head . Vitals 93% pulse 53 t 97.4 BP large cuff 132 80

## 2017-09-28 NOTE — ED Triage Notes (Signed)
Pt presents to ED via [facility] s/p fall from [facility]. CBG 90, BP 186/84, Pulse 52, O2 95% on RA. Per EMS pt finished abx for strep yesterday. EMS reports mechanical fall, slipped on wet concrete and landed on L side, reports abrasions to L side and head injury. Pt reports dull pain to head, denies LOC. Pt is alert and oriented on arrival to ED. EMS reports PERRLA.

## 2017-09-28 NOTE — ED Notes (Addendum)
Ambulated patient in hallway assisted by a [place] NT student.  Pt made it a short distance and started c/o dizziness.  Ambulated pt back to room, dizziness resolved after laying down on stretcher. MD notified. No new orders, will continue to monitor.

## 2017-09-28 NOTE — ED Notes (Signed)
Pt assisted to the bathroom by this RN, pt back to bed without incident. Will continue to monitor for further patient needs.

## 2017-10-01 NOTE — Telephone Encounter (Signed)
If needed ok to hold eliquis for 2 days prior to dental procedure

## 2017-10-02 NOTE — Telephone Encounter (Signed)
Clearance routed to number provided via Epic.  

## 2017-10-03 ENCOUNTER — Other Ambulatory Visit: Payer: Self-pay | Admitting: Neurology

## 2017-10-03 DIAGNOSIS — G43009 Migraine without aura, not intractable, without status migrainosus: Secondary | ICD-10-CM

## 2017-10-05 NOTE — Telephone Encounter (Signed)
Called pt & LVM (ok per DPR) asking for call back to discuss Maxalt. Left office number in message.

## 2017-10-05 NOTE — Telephone Encounter (Signed)
Pt returned RN's call °
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
## 2017-10-09 ENCOUNTER — Ambulatory Visit: Payer: Medicare Other | Admitting: Psychiatry

## 2017-10-09 ENCOUNTER — Other Ambulatory Visit: Payer: Self-pay

## 2017-10-09 ENCOUNTER — Encounter: Payer: Self-pay | Admitting: Psychiatry

## 2017-10-09 VITALS — BP 162/73 | HR 68 | Temp 98.2°F | Wt 331.0 lb

## 2017-10-09 DIAGNOSIS — F4310 Post-traumatic stress disorder, unspecified: Secondary | ICD-10-CM

## 2017-10-09 DIAGNOSIS — Z9989 Dependence on other enabling machines and devices: Secondary | ICD-10-CM

## 2017-10-09 DIAGNOSIS — F331 Major depressive disorder, recurrent, moderate: Secondary | ICD-10-CM

## 2017-10-09 DIAGNOSIS — G4733 Obstructive sleep apnea (adult) (pediatric): Secondary | ICD-10-CM

## 2017-10-09 MED ORDER — HYDROXYZINE HCL 10 MG PO TABS: 10.0000 mg | ORAL_TABLET | Freq: Three times a day (TID) | ORAL | 1 refills | Status: DC | PRN

## 2017-10-09 MED ORDER — FLUVOXAMINE MALEATE 50 MG PO TABS: 125.0000 mg | ORAL_TABLET | Freq: Every day | ORAL | 1 refills | Status: DC
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
## 2017-10-09 NOTE — Patient Instructions (Signed)
[facility]  Calorie Counting for Weight Loss Calories are units of energy. Your body needs a certain amount of calories from food to keep you going throughout the day. When you eat more calories than your body needs, your body stores the extra calories as fat. When you eat fewer calories than your body needs, your body burns fat to get the energy it needs. Calorie counting means keeping track of how many calories you eat and drink each day. Calorie counting can be helpful if you need to lose weight. If you make sure to eat fewer calories than your body needs, you should lose weight. Ask your health care provider what a healthy weight is for you. For calorie counting to work, you will need to eat the right number of calories in a day in order to lose a healthy amount of weight per week. A dietitian can help you determine how many calories you need in a day and will give you suggestions on how to reach your calorie goal.  A healthy amount of weight to lose per week is usually 1-2 lb (0.5-0.9 kg). This usually means that your daily calorie intake should be reduced by 500-750 calories.  Eating 1,200 - 1,500 calories per day can help most women lose weight.  Eating 1,500 - 1,800 calories per day can help most men lose weight.  What is my plan? My goal is to have __________ calories per day. If I have this many calories per day, I should lose around __________ pounds per week. What do I need to know about calorie counting? In order to meet your daily calorie goal, you will need to:  Find out how many calories are in each food you would like to eat. Try to do this before you eat.  Decide how much of the food you plan to eat.  Write down what you ate and how many calories it had. Doing this is called keeping a food log.  To successfully lose weight, it is important to balance calorie counting with a healthy lifestyle that includes regular activity. Aim for 150 minutes of moderate exercise (such as  walking) or 75 minutes of vigorous exercise (such as running) each week. Where do I find calorie information?  The number of calories in a food can be found on a Nutrition Facts label. If a food does not have a Nutrition Facts label, try to look up the calories online or ask your dietitian for help. Remember that calories are listed per serving. If you choose to have more than one serving of a food, you will have to multiply the calories per serving by the amount of servings you plan to eat. For example, the label on a package of bread might say that a serving size is 1 slice and that there are 90 calories in a serving. If you eat 1 slice, you will have eaten 90 calories. If you eat 2 slices, you will have eaten 180 calories. How do I keep a food log? Immediately after each meal, record the following information in your food log:  What you ate. Don't forget to include toppings, sauces, and other extras on the food.  How much you ate. This can be measured in cups, ounces, or number of items.  How many calories each food and drink had.  The total number of calories in the meal.  Keep your food log near you, such as in a small notebook in your pocket, or use a mobile app or  website. Some programs will calculate calories for you and show you how many calories you have left for the day to meet your goal. What are some calorie counting tips?  Use your calories on foods and drinks that will fill you up and not leave you hungry: ? Some examples of foods that fill you up are nuts and nut butters, vegetables, lean proteins, and high-fiber foods like whole grains. High-fiber foods are foods with more than 5 g fiber per serving. ? Drinks such as sodas, specialty coffee drinks, alcohol, and juices have a lot of calories, yet do not fill you up.  Eat nutritious foods and avoid empty calories. Empty calories are calories you get from foods or beverages that do not have many vitamins or protein, such as  candy, sweets, and soda. It is better to have a nutritious high-calorie food (such as an avocado) than a food with few nutrients (such as a bag of chips).  Know how many calories are in the foods you eat most often. This will help you calculate calorie counts faster.  Pay attention to calories in drinks. Low-calorie drinks include water and unsweetened drinks.  Pay attention to nutrition labels for "low fat" or "fat free" foods. These foods sometimes have the same amount of calories or more calories than the full fat versions. They also often have added sugar, starch, or salt, to make up for flavor that was removed with the fat.  Find a way of tracking calories that works for you. Get creative. Try different apps or programs if writing down calories does not work for you. What are some portion control tips?  Know how many calories are in a serving. This will help you know how many servings of a certain food you can have.  Use a measuring cup to measure serving sizes. You could also try weighing out portions on a kitchen scale. With time, you will be able to estimate serving sizes for some foods.  Take some time to put servings of different foods on your favorite plates, bowls, and cups so you know what a serving looks like.  Try not to eat straight from a bag or box. Doing this can lead to overeating. Put the amount you would like to eat in a cup or on a plate to make sure you are eating the right portion.  Use smaller plates, glasses, and bowls to prevent overeating.  Try not to multitask (for example, watch TV or use your computer) while eating. If it is time to eat, sit down at a table and enjoy your food. This will help you to know when you are full. It will also help you to be aware of what you are eating and how much you are eating. What are tips for following this plan? Reading food labels  Check the calorie count compared to the serving size. The serving size may be smaller than what  you are used to eating.  Check the source of the calories. Make sure the food you are eating is high in vitamins and protein and low in saturated and trans fats. Shopping  Read nutrition labels while you shop. This will help you make healthy decisions before you decide to purchase your food.  Make a grocery list and stick to it. Cooking  Try to cook your favorite foods in a healthier way. For example, try baking instead of frying.  Use low-fat dairy products. Meal planning  Use more fruits and vegetables. Half of your  plate should be fruits and vegetables.  Include lean proteins like poultry and fish. How do I count calories when eating out?  Ask for smaller portion sizes.  Consider sharing an entree and sides instead of getting your own entree.  If you get your own entree, eat only half. Ask for a box at the beginning of your meal and put the rest of your entree in it so you are not tempted to eat it.  If calories are listed on the menu, choose the lower calorie options.  Choose dishes that include vegetables, fruits, whole grains, low-fat dairy products, and lean protein.  Choose items that are boiled, broiled, grilled, or steamed. Stay away from items that are buttered, battered, fried, or served with cream sauce. Items labeled "crispy" are usually fried, unless stated otherwise.  Choose water, low-fat milk, unsweetened iced tea, or other drinks without added sugar. If you want an alcoholic beverage, choose a lower calorie option such as a glass of wine or light beer.  Ask for dressings, sauces, and syrups on the side. These are usually high in calories, so you should limit the amount you eat.  If you want a salad, choose a garden salad and ask for grilled meats. Avoid extra toppings like bacon, cheese, or fried items. Ask for the dressing on the side, or ask for olive oil and vinegar or lemon to use as dressing.  Estimate how many servings of a food you are given. For  example, a serving of cooked rice is  cup or about the size of half a baseball. Knowing serving sizes will help you be aware of how much food you are eating at restaurants. The list below tells you how big or small some common portion sizes are based on everyday objects: ? 1 oz-4 stacked dice. ? 3 oz-1 deck of cards. ? 1 tsp-1 die. ? 1 Tbsp- a ping-pong ball. ? 2 Tbsp-1 ping-pong ball. ?  cup- baseball. ? 1 cup-1 baseball. Summary  Calorie counting means keeping track of how many calories you eat and drink each day. If you eat fewer calories than your body needs, you should lose weight.  A healthy amount of weight to lose per week is usually 1-2 lb (0.5-0.9 kg). This usually means reducing your daily calorie intake by 500-750 calories.  The number of calories in a food can be found on a Nutrition Facts label. If a food does not have a Nutrition Facts label, try to look up the calories online or ask your dietitian for help.  Use your calories on foods and drinks that will fill you up, and not on foods and drinks that will leave you hungry.  Use smaller plates, glasses, and bowls to prevent overeating. This information is not intended to replace advice given to you by your health care provider. Make sure you discuss any questions you have with your health care provider. Document Released: 06/27/2005 Document Revised: 05/27/2016 Document Reviewed: 05/27/2016 Elsevier Interactive Patient Education  2018 [employer]. Hydroxyzine capsules or tablets What is this medicine? HYDROXYZINE (hye [place] i zeen) is an antihistamine. This medicine is used to treat allergy symptoms. It is also used to treat anxiety and tension. This medicine can be used with other medicines to induce sleep before surgery. This medicine may be used for other purposes; ask your health care provider or pharmacist if you have questions. COMMON BRAND NAME(S): ANX, Atarax, Rezine, Vistaril What should I tell my health care  provider before I take  this medicine? They need to know if you have any of these conditions: -any chronic illness -difficulty passing urine -glaucoma -heart disease -kidney disease -liver disease -lung disease -an unusual or allergic reaction to hydroxyzine, cetirizine, other medicines, foods, dyes, or preservatives -pregnant or trying to get pregnant -breast-feeding How should I use this medicine? Take this medicine by mouth with a full glass of water. Follow the directions on the prescription label. You may take this medicine with food or on an empty stomach. Take your medicine at regular intervals. Do not take your medicine more often than directed. Talk to your pediatrician regarding the use of this medicine in children. Special care may be needed. While this drug may be prescribed for children as young as 50 years of age for selected conditions, precautions do apply. Patients over 57 years old may have a stronger reaction and need a smaller dose. Overdosage: If you think you have taken too much of this medicine contact a poison control center or emergency room at once. NOTE: This medicine is only for you. Do not share this medicine with others. What if I miss a dose? If you miss a dose, take it as soon as you can. If it is almost time for your next dose, take only that dose. Do not take double or extra doses. What may interact with this medicine? -alcohol -barbiturate medicines for sleep or seizures -medicines for colds, allergies -medicines for depression, anxiety, or emotional disturbances -medicines for pain -medicines for sleep -muscle relaxants This list may not describe all possible interactions. Give your health care provider a list of all the medicines, herbs, non-prescription drugs, or dietary supplements you use. Also tell them if you smoke, drink alcohol, or use illegal drugs. Some items may interact with your medicine. What should I watch for while using this  medicine? Tell your doctor or health care professional if your symptoms do not improve. You may get drowsy or dizzy. Do not drive, use machinery, or do anything that needs mental alertness until you know how this medicine affects you. Do not stand or sit up quickly, especially if you are an older patient. This reduces the risk of dizzy or fainting spells. Alcohol may interfere with the effect of this medicine. Avoid alcoholic drinks. Your mouth may get dry. Chewing sugarless gum or sucking hard candy, and drinking plenty of water may help. Contact your doctor if the problem does not go away or is severe. This medicine may cause dry eyes and blurred vision. If you wear contact lenses you may feel some discomfort. Lubricating drops may help. See your eye doctor if the problem does not go away or is severe. If you are receiving skin tests for allergies, tell your doctor you are using this medicine. What side effects may I notice from receiving this medicine? Side effects that you should report to your doctor or health care professional as soon as possible: -fast or irregular heartbeat -difficulty passing urine -seizures -slurred speech or confusion -tremor Side effects that usually do not require medical attention (report to your doctor or health care professional if they continue or are bothersome): -constipation -drowsiness -fatigue -headache -stomach upset This list may not describe all possible side effects. Call your doctor for medical advice about side effects. You may report side effects to FDA at 1-800-FDA-1088. Where should I keep my medicine? Keep out of the reach of children. Store at room temperature between 15 and 30 degrees C (59 and 86 degrees F). Keep container  tightly closed. Throw away any unused medicine after the expiration date. NOTE: This sheet is a summary. It may not cover all possible information. If you have questions about this medicine, talk to your doctor, pharmacist,  or health care provider.  2018 Elsevier/Gold Standard (2007-11-09 14:50:59) Fluvoxamine tablets What is this medicine? FLUVOXAMINE (floo VOX a meen) is an antidepressant. It is used to treat obsessive-compulsive disorder. This medicine may be used for other purposes; ask your health care provider or pharmacist if you have questions. COMMON BRAND NAME(S): Luvox What should I tell my health care provider before I take this medicine? They need to know if you have any of these conditions: -bipolar disorder or a family history of bipolar disorder -bleeding disorders -glaucoma -heart disease -liver disease -low levels of sodium in the blood -seizures -suicidal thoughts, plans, or attempt; a previous suicide attempt by you or a family member -take MAOIs like Carbex, Eldepryl, Marplan, Nardil, and Parnate -take medicines that treat or prevent blood clots -thyroid disease -an unusual or allergic reaction to fluvoxamine, other medicines, foods, dyes, or preservatives -pregnant or trying to get pregnant -breast-feeding How should I use this medicine? Take this medicine by mouth with a glass of water. Follow the directions on the prescription label. You can take this medicine with or without food. Take your doses at regular intervals. Do not take your medicine more often than directed. Do not stop taking this medicine suddenly except upon the advice of your doctor. Stopping this medicine too quickly may cause serious side effects or your condition may worsen. A special MedGuide will be given to you by the pharmacist with each prescription and refill. Be sure to read this information carefully each time. Talk to your pediatrician regarding the use of this medicine in children. While this drug may be prescribed for children as young as 8 years for selected conditions, precautions do apply. Overdosage: If you think you have taken too much of this medicine contact a poison control center or emergency  room at once. NOTE: This medicine is only for you. Do not share this medicine with others. What if I miss a dose? If you miss a dose, take it as soon as you can. If it is almost time for your next dose, take only that dose. Do not take double or extra doses. What may interact with this medicine? Do not take this medicine with any of the following medications: -alosetron -cisapride -linezolid -MAOIs like Carbex, Eldepryl, Marplan, Nardil, and Parnate -methylene blue (injected into a vein) -pimozide -thioridazine -tizanidine This medicine may also interact with the following medications: -alcohol -amphetamines -aspirin and aspirin-like medicines -certain medicines for depression, anxiety, or psychotic disturbances -certain medicines for migraine headache like almotriptan, eletriptan, frovatriptan, naratriptan, rizatriptan, sumatriptan, zolmitriptan -certain medicines for seizures like carbamazepine and phenytoin -clozapine -diltiazem -diuretics -fentanyl -furazolidone -isoniazid -lithium -medicines that treat or prevent blood clots like warfarin, enoxaparin, and dalteparin -medicines for sleep -methadone -metoprolol -mexiletine -NSAIDS, medicines for pain and inflammation, like ibuprofen or naproxen -omeprazole -procarbazine -propranolol -quinidine -ramelteon -rasagiline -supplements like St. John's wort, kava kava, valerian -tacrine -theophylline -tramadol -tryptophan This list may not describe all possible interactions. Give your health care provider a list of all the medicines, herbs, non-prescription drugs, or dietary supplements you use. Also tell them if you smoke, drink alcohol, or use illegal drugs. Some items may interact with your medicine. What should I watch for while using this medicine? Tell your doctor if your symptoms do not get better or  if they get worse. Visit your doctor or health care professional for regular checks on your progress. Because it may  take several weeks to see the full effects of this medicine, it is important to continue your treatment as prescribed by your doctor. Patients and their families should watch out for new or worsening thoughts of suicide or depression. Also watch out for sudden changes in feelings such as feeling anxious, agitated, panicky, irritable, hostile, aggressive, impulsive, severely restless, overly excited and hyperactive, or not being able to sleep. If this happens, especially at the beginning of treatment or after a change in dose, call your health care professional. Dennis Bast may get drowsy or dizzy. Do not drive, use machinery, or do anything that needs mental alertness until you know how this medicine affects you. Do not stand or sit up quickly, especially if you are an older patient. This reduces the risk of dizzy or fainting spells. Alcohol may interfere with the effect of this medicine. Avoid alcoholic drinks. Your mouth may get dry. Chewing sugarless gum or sucking hard candy, and drinking plenty of water may help. Contact your doctor if the problem does not go away or is severe. What side effects may I notice from receiving this medicine? Side effects that you should report to your doctor or health care professional as soon as possible: -allergic reactions like skin rash, itching or hives, swelling of the face, lips, or tongue -anxious -black, tarry stools -changes in vision -confusion -elevated mood, decreased need for sleep, racing thoughts, impulsive behavior -eye pain -fast, irregular heartbeat -feeling faint or lightheaded, falls -feeling agitated, angry, or irritable -hallucination, loss of contact with reality -loss of balance or coordination -loss of memory -painful or prolonged erections -restlessness, pacing, inability to keep still -seizures -stiff muscles -suicidal thoughts or other mood changes -trouble sleeping -unusual bleeding or bruising -unusually weak or  tired -vomiting Side effects that usually do not require medical attention (report to your doctor or health care professional if they continue or are bothersome): -change in appetite or weight -change in sex drive or performance -headache -increased sweating -indigestion, nausea -tremors This list may not describe all possible side effects. Call your doctor for medical advice about side effects. You may report side effects to FDA at 1-800-FDA-1088. Where should I keep my medicine? Keep out of the reach of children. Store at room temperature between 15 and 30 degrees C (59 and 86 degrees F). Protect from humidity. Keep container tightly closed. Throw away any unused medicine after the expiration date. NOTE: This sheet is a summary. It may not cover all possible information. If you have questions about this medicine, talk to your doctor, pharmacist, or health care provider.  2018 Elsevier/Gold Standard (2015-11-28 16:12:30)

## 2017-10-10 ENCOUNTER — Encounter: Payer: Self-pay | Admitting: Cardiovascular Disease

## 2017-10-10 ENCOUNTER — Ambulatory Visit: Payer: Medicare Other | Admitting: Cardiovascular Disease

## 2017-10-10 VITALS — BP 152/82 | HR 53 | Ht 65.0 in | Wt 332.0 lb

## 2017-10-10 DIAGNOSIS — E782 Mixed hyperlipidemia: Secondary | ICD-10-CM

## 2017-10-10 DIAGNOSIS — I700 Atherosclerosis of aorta: Secondary | ICD-10-CM

## 2017-10-10 DIAGNOSIS — I481 Persistent atrial fibrillation: Secondary | ICD-10-CM | POA: Diagnosis not present

## 2017-10-10 DIAGNOSIS — I4819 Other persistent atrial fibrillation: Secondary | ICD-10-CM

## 2017-10-10 DIAGNOSIS — I10 Essential (primary) hypertension: Secondary | ICD-10-CM

## 2017-10-10 DIAGNOSIS — R0602 Shortness of breath: Secondary | ICD-10-CM

## 2017-10-10 NOTE — Patient Instructions (Signed)
Medication Instructions:   No medication changes made Monitor blood pressure at home Goal <140  Labwork:  No new labs needed  Testing/Procedures:  No further testing at this time   Follow-Up: It was a pleasure seeing you in the office today. Please call us if you have new issues that need to be addressed before your next appt.  [phone]  Your physician wants you to follow-up in: 12 months.  You will receive a reminder letter in the mail two months in advance. If you don't receive a letter, please call our office to schedule the follow-up appointment.  If you need a refill on your cardiac medications before your next appointment, please call your pharmacy.  For educational health videos Log in to : [email or web address] Or : [email or web address], password : [email or web address]

## 2017-10-13 ENCOUNTER — Other Ambulatory Visit: Payer: Self-pay | Admitting: Cardiovascular Disease

## 2017-10-16 ENCOUNTER — Ambulatory Visit: Payer: Medicare Other | Admitting: Psychology

## 2017-10-16 DIAGNOSIS — F322 Major depressive disorder, single episode, severe without psychotic features: Secondary | ICD-10-CM | POA: Diagnosis not present

## 2017-10-16 DIAGNOSIS — F418 Other specified anxiety disorders: Secondary | ICD-10-CM | POA: Diagnosis not present

## 2017-10-17 ENCOUNTER — Telehealth: Payer: Self-pay

## 2017-10-17 ENCOUNTER — Other Ambulatory Visit: Payer: Self-pay

## 2017-10-17 ENCOUNTER — Ambulatory Visit: Payer: Medicare Other | Admitting: Psychiatry

## 2017-10-17 ENCOUNTER — Encounter: Payer: Self-pay | Admitting: Psychiatry

## 2017-10-17 VITALS — BP 172/89 | HR 66 | Temp 97.8°F | Wt 327.2 lb

## 2017-10-17 DIAGNOSIS — F410 Panic disorder [episodic paroxysmal anxiety] without agoraphobia: Secondary | ICD-10-CM

## 2017-10-17 DIAGNOSIS — F331 Major depressive disorder, recurrent, moderate: Secondary | ICD-10-CM | POA: Diagnosis not present

## 2017-10-17 DIAGNOSIS — F411 Generalized anxiety disorder: Secondary | ICD-10-CM

## 2017-10-17 DIAGNOSIS — F4310 Post-traumatic stress disorder, unspecified: Secondary | ICD-10-CM

## 2017-10-17 MED ORDER — HYDROXYZINE PAMOATE 25 MG PO CAPS: 25.0000 mg | ORAL_CAPSULE | Freq: Three times a day (TID) | ORAL | 1 refills | Status: DC | PRN

## 2017-10-17 MED ORDER — TRAZODONE HCL 50 MG PO TABS: 25.0000 mg | ORAL_TABLET | Freq: Every day | ORAL | 1 refills | Status: DC

## 2017-10-17 MED ORDER — HYDROXYZINE HCL 10 MG PO TABS: 30.0000 mg | ORAL_TABLET | Freq: Two times a day (BID) | ORAL | 0 refills | Status: DC | PRN

## 2017-10-17 NOTE — Telephone Encounter (Signed)
received a notice that hydroxyzine pam 25mg  caps is on nationwide back order requesting an alternative.

## 2017-10-17 NOTE — Patient Instructions (Signed)
Trazodone tablets What is this medicine? TRAZODONE (TRAZ oh done) is used to treat depression. This medicine may be used for other purposes; ask your health care provider or pharmacist if you have questions. COMMON BRAND NAME(S): Desyrel What should I tell my health care provider before I take this medicine? They need to know if you have any of these conditions: -attempted suicide or thinking about it -bipolar disorder -bleeding problems -glaucoma -heart disease, or previous heart attack -irregular heart beat -kidney or liver disease -low levels of sodium in the blood -an unusual or allergic reaction to trazodone, other medicines, foods, dyes or preservatives -pregnant or trying to get pregnant -breast-feeding How should I use this medicine? Take this medicine by mouth with a glass of water. Follow the directions on the prescription label. Take this medicine shortly after a meal or a light snack. Take your medicine at regular intervals. Do not take your medicine more often than directed. Do not stop taking this medicine suddenly except upon the advice of your doctor. Stopping this medicine too quickly may cause serious side effects or your condition may worsen. A special MedGuide will be given to you by the pharmacist with each prescription and refill. Be sure to read this information carefully each time. Talk to your pediatrician regarding the use of this medicine in children. Special care may be needed. Overdosage: If you think you have taken too much of this medicine contact a poison control center or emergency room at once. NOTE: This medicine is only for you. Do not share this medicine with others. What if I miss a dose? If you miss a dose, take it as soon as you can. If it is almost time for your next dose, take only that dose. Do not take double or extra doses. What may interact with this medicine? Do not take this medicine with any of the following medications: -certain medicines  for fungal infections like fluconazole, itraconazole, ketoconazole, posaconazole, voriconazole -cisapride -dofetilide -dronedarone -linezolid -MAOIs like Carbex, Eldepryl, Marplan, Nardil, and Parnate -mesoridazine -methylene blue (injected into a vein) -pimozide -saquinavir -thioridazine -ziprasidone This medicine may also interact with the following medications: -alcohol -antiviral medicines for HIV or AIDS -aspirin and aspirin-like medicines -barbiturates like phenobarbital -certain medicines for blood pressure, heart disease, irregular heart beat -certain medicines for depression, anxiety, or psychotic disturbances -certain medicines for migraine headache like almotriptan, eletriptan, frovatriptan, naratriptan, rizatriptan, sumatriptan, zolmitriptan -certain medicines for seizures like carbamazepine and phenytoin -certain medicines for sleep -certain medicines that treat or prevent blood clots like dalteparin, enoxaparin, warfarin -digoxin -fentanyl -lithium -NSAIDS, medicines for pain and inflammation, like ibuprofen or naproxen -other medicines that prolong the QT interval (cause an abnormal heart rhythm) -rasagiline -supplements like St. John's wort, kava kava, valerian -tramadol -tryptophan This list may not describe all possible interactions. Give your health care provider a list of all the medicines, herbs, non-prescription drugs, or dietary supplements you use. Also tell them if you smoke, drink alcohol, or use illegal drugs. Some items may interact with your medicine. What should I watch for while using this medicine? Tell your doctor if your symptoms do not get better or if they get worse. Visit your doctor or health care professional for regular checks on your progress. Because it may take several weeks to see the full effects of this medicine, it is important to continue your treatment as prescribed by your doctor. Patients and their families should watch out for new  or worsening thoughts of suicide or depression. Also   watch out for sudden changes in feelings such as feeling anxious, agitated, panicky, irritable, hostile, aggressive, impulsive, severely restless, overly excited and hyperactive, or not being able to sleep. If this happens, especially at the beginning of treatment or after a change in dose, call your health care professional. You may get drowsy or dizzy. Do not drive, use machinery, or do anything that needs mental alertness until you know how this medicine affects you. Do not stand or sit up quickly, especially if you are an older patient. This reduces the risk of dizzy or fainting spells. Alcohol may interfere with the effect of this medicine. Avoid alcoholic drinks. This medicine may cause dry eyes and blurred vision. If you wear contact lenses you may feel some discomfort. Lubricating drops may help. See your eye doctor if the problem does not go away or is severe. Your mouth may get dry. Chewing sugarless gum, sucking hard candy and drinking plenty of water may help. Contact your doctor if the problem does not go away or is severe. What side effects may I notice from receiving this medicine? Side effects that you should report to your doctor or health care professional as soon as possible: -allergic reactions like skin rash, itching or hives, swelling of the face, lips, or tongue -elevated mood, decreased need for sleep, racing thoughts, impulsive behavior -confusion -fast, irregular heartbeat -feeling faint or lightheaded, falls -feeling agitated, angry, or irritable -loss of balance or coordination -painful or prolonged erections -restlessness, pacing, inability to keep still -suicidal thoughts or other mood changes -tremors -trouble sleeping -seizures -unusual bleeding or bruising Side effects that usually do not require medical attention (report to your doctor or health care professional if they continue or are bothersome): -change in  sex drive or performance -change in appetite or weight -constipation -headache -muscle aches or pains -nausea This list may not describe all possible side effects. Call your doctor for medical advice about side effects. You may report side effects to FDA at 1-800-FDA-1088. Where should I keep my medicine? Keep out of the reach of children. Store at room temperature between 15 and 30 degrees C (59 to 86 degrees F). Protect from light. Keep container tightly closed. Throw away any unused medicine after the expiration date. NOTE: This sheet is a summary. It may not cover all possible information. If you have questions about this medicine, talk to your doctor, pharmacist, or health care provider.  2018 Elsevier/Gold Standard (2015-11-26 16:57:05)  
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
## 2017-10-17 NOTE — Telephone Encounter (Signed)
I have sent vistaril 10 mg - take 3 per days to make it 30 mg as needed for anxiety sx - bid prn.  Have sent new script to patient's pharmacy ,pls let pt know.

## 2017-10-18 ENCOUNTER — Encounter: Payer: Self-pay | Admitting: Psychiatry

## 2017-10-24 ENCOUNTER — Other Ambulatory Visit: Payer: Self-pay | Admitting: Family

## 2017-10-24 DIAGNOSIS — K760 Fatty (change of) liver, not elsewhere classified: Secondary | ICD-10-CM

## 2017-11-01 ENCOUNTER — Other Ambulatory Visit: Payer: Self-pay | Admitting: Cardiovascular Disease

## 2017-11-08 ENCOUNTER — Ambulatory Visit (INDEPENDENT_AMBULATORY_CARE_PROVIDER_SITE_OTHER): Payer: Medicare Other | Admitting: Family Medicine

## 2017-11-08 ENCOUNTER — Other Ambulatory Visit: Payer: Self-pay

## 2017-11-08 ENCOUNTER — Encounter: Payer: Self-pay | Admitting: Family Medicine

## 2017-11-08 ENCOUNTER — Ambulatory Visit: Payer: Medicare Other | Admitting: Psychology

## 2017-11-08 VITALS — BP 124/70 | HR 58 | Temp 97.9°F | Ht 65.0 in | Wt 334.5 lb

## 2017-11-08 DIAGNOSIS — M5412 Radiculopathy, cervical region: Secondary | ICD-10-CM | POA: Diagnosis not present

## 2017-11-08 DIAGNOSIS — M7022 Olecranon bursitis, left elbow: Secondary | ICD-10-CM

## 2017-11-08 MED ORDER — PREDNISONE 10 MG PO TABS: ORAL_TABLET | ORAL | 0 refills | Status: DC

## 2017-11-08 NOTE — Progress Notes (Signed)
Dr. Frederico Hamman T. Shirlean Berman, MD, [facility] [place], [address] Phone: [phone] Fax: [phone]  11/08/2017  Patient: Amber Barnes, MRN: [id number], DOB: [birth year], 67 y.o.  Primary Physician:  [name], FNP   Chief Complaint  Patient presents with  . Joint Swelling    Left Elbow (fluid)   Subjective:   Amber Barnes is a 67 y.o. very pleasant female patient who presents with the following:  Olec burs L: Pleasant patient who I am asked to see acutely who has a known history of atrial fibrillation and is on Eliquis as well as amiodarone.  Body mass index is 55.66 kg/m.  She comes in with a several day history of some swelling at the elbow at the olecranon bursa without any trauma or injury that she can recall.  She has never had this before.  She is also having pain in the neck and pain going down the entirety of the arm on the left, and this is without any significant numbness or without any significant weakness.  She does not have a history of former neck problems and is never had a significant history of cervical spine surgery.  She does have a remote history of back surgery, but I do not have the entire details of this procedure, and is not of the cervical spine.  Past Medical History, Surgical History, Social History, Family History, Problem List, Medications, and Allergies have been reviewed and updated if relevant.  Patient Active Problem List   Diagnosis Date Noted  . Atherosclerosis of aorta ([place] [place]) 08/25/2017  . Migraine with aura and without status migrainosus, not intractable 06/27/2017  . Bradycardia 05/22/2017  . Depression, recurrent ([place]) 05/22/2017  . Restless leg 04/13/2017  . OSA (obstructive sleep apnea) 01/09/2017  . Acute pain of left shoulder 11/28/2016  . Persistent atrial fibrillation ([facility]) 08/11/2016  . Acute pulmonary edema ([facility])   . New onset a-fib ([place]) 07/28/2016  .  Hypokalemia 07/28/2016  . Left foot pain 07/28/2016  . Atypical chest pain   . Fatty liver 06/30/2016  . Dizziness 06/08/2016  . Family history of ovarian cancer 06/08/2016  . GERD (gastroesophageal reflux disease) 05/05/2016  . Seasonal rhinitis 05/05/2016  . Routine physical examination 05/05/2016  . Heel spur 11/05/2015  . Tendonitis 11/05/2015  . SOB (shortness of breath) 11/11/2014  . Morbid obesity ([facility]) 11/11/2014  . Mixed hyperlipidemia 11/11/2014  . Essential hypertension 11/11/2014  . Anxiety and depression 11/11/2014  . Arthritis, senescent 11/11/2014  . Weight loss due to medication 11/11/2014  . Obesity, Class III, BMI 40-49.9 (morbid obesity) ([facility] [place]) 04/11/2013    Past Medical History:  Diagnosis Date  . Achilles tendinitis   . Acute medial meniscal tear   . Allergy   . Anxiety   . Arthritis   . Atrial fibrillation ([facility])    a. new onset 07/2016; b. CHADS2VASc --> 2 (HTN, female); c. eliquis  . CHF (congestive heart failure) ([facility])   . Chronic lower back pain   . COPD (chronic obstructive pulmonary disease) ([facility])   . Depression   . Fibrocystic breast disease   . GERD (gastroesophageal reflux disease)   . Hyperlipidemia   . Hypertension   . Insomnia   . Lipoma   . Migraine   . Osteoarthritis    left knee  . Osteopenia   . Tendonitis   . Thyroid disease   . Vitamin D deficiency     Past Surgical  History:  Procedure Laterality Date  . ABDOMINAL HYSTERECTOMY     ovaries left  . ACHILLES TENDON SURGERY     2012,01/2017  . BACK SURGERY    . CARDIOVERSION N/A 09/07/2016   Procedure: CARDIOVERSION;  Surgeon: Minna Merritts, MD;  Location: [facility] ORS;  Service: Cardiovascular;  Laterality: N/A;  . CARDIOVERSION N/A 10/19/2016   Procedure: CARDIOVERSION;  Surgeon: Minna Merritts, MD;  Location: [facility] ORS;  Service: Cardiovascular;  Laterality: N/A;  . CARDIOVERSION N/A 12/14/2016   Procedure: CARDIOVERSION;  Surgeon: Minna Merritts, MD;  Location: [facility] ORS;   Service: Cardiovascular;  Laterality: N/A;  . COLONOSCOPY    . ESOPHAGOGASTRODUODENOSCOPY    . THYROIDECTOMY     thyroid lobectomy secondary to a benign nodule    Social History   Socioeconomic History  . Marital status: Married    Spouse name: jeffrey  . Number of children: 1  . Years of education: Not on file  . Highest education level: High school graduate  Occupational History    Comment: disabled  Social Needs  . Financial resource strain: Somewhat hard  . Food insecurity:    Worry: Never true    Inability: Never true  . Transportation needs:    Medical: No    Non-medical: No  Tobacco Use  . Smoking status: Former Smoker    Packs/day: 0.25    Years: 10.00    Pack years: 2.50    Types: Cigarettes    Last attempt to quit: 2005    Years since quitting: 14.3  . Smokeless tobacco: Former Systems developer    Quit date: 07/11/2001  . Tobacco comment: smoked for 10 years, 1 pack every 2-3 days  Substance and Sexual Activity  . Alcohol use: Not Currently    Comment: social, occasional  . Drug use: No  . Sexual activity: Not Currently  Lifestyle  . Physical activity:    Days per week: 0 days    Minutes per session: 0 min  . Stress: Very much  Relationships  . Social connections:    Talks on phone: Never    Gets together: Once a week    Attends religious service: More than 4 times per year    Active member of club or organization: No    Attends meetings of clubs or organizations: Never    Relationship status: Married  . Intimate partner violence:    Fear of current or ex partner: No    Emotionally abused: No    Physically abused: No    Forced sexual activity: No  Other Topics Concern  . Not on file  Social History Narrative   Moved to [place] from [place], originally from [place].    1 cup of caffeine daily   Right handed           Family History  Problem Relation Age of Onset  . Cancer Sister 49       ovarian  . Depression Sister   . Cancer  Father 79       colon  . Heart disease Father   . Heart attack Father 71       died from MI  . Hyperlipidemia Father   . Hypertension Father   . Cancer Mother        leukemia  . Breast cancer Paternal Aunt        59's  . Migraines Neg Hx     Allergies  Allergen Reactions  . Prednisone Other (See  Comments)    Agitation, hyperactivity, polyphagia  . Hydrocodone-Acetaminophen Other (See Comments)    Hallucinations and combative  . Oxycodone Itching  . Adhesive [Tape] Itching and Rash  . Latex Itching and Rash    use paper tape    Medication list reviewed and updated in full in [place].  GEN: No fevers, chills. Nontoxic. Primarily MSK c/o today. MSK: Detailed in the HPI GI: tolerating PO intake without difficulty Neuro: No numbness, parasthesias, or tingling associated. Otherwise the pertinent positives of the ROS are noted above.   Objective:   BP 124/70   Pulse (!) 58   Temp 97.9 F (36.6 C) (Oral)   Ht 5\' 5"  (1.651 m)   Wt (!) 334 lb 8 oz (151.7 kg)   BMI 55.66 kg/m    GEN: Well-developed,well-nourished,in no acute distress; alert,appropriate and cooperative throughout examination HEENT: Normocephalic and atraumatic without obvious abnormalities. Ears, externally no deformities PULM: Breathing comfortably in no respiratory distress EXT: No clubbing, cyanosis, or edema PSYCH: Normally interactive. Cooperative during the interview. Pleasant. Friendly and conversant. Not anxious or depressed appearing. Normal, full affect.  CERVICAL SPINE EXAM Range of motion: Flexion, extension, lateral bending, and rotation: Approximate 15% loss of extension, she does have some increase in pain with rotational movements in both directions.  Lateral movement is preserved. Pain with terminal motion: Yes Spinous Processes: NT SCM: NT Upper paracervical muscles: Diffuse tenderness Upper traps: NT C5-T1 intact, sensation and motor   At the elbow on the left, the patient  has a boggy olecranon bursa, but full range of motion at the elbow and strength is preserved entirely.  Radiology: No results found.   Assessment and Plan:   Olecranon bursitis, left elbow  Left cervical radiculopathy  Consideration of aspiration is reasonable. Discussed with the patient that aspiration and injection has risk of infection. There is also a significant failure rate. Bursectomy is the definitive surgical treatment if the patient does poorly long term.  ACE bandage for compression over the next 72 hours  I think that the radicular pain and neck pain Is an entirely different problem.  This is currently causing her more pain.  She cannot take NSAIDs secondary to Eliquis use.  She is on both Eliquis and amiodarone.  I am going to place the patient on some low-dose of oral steroids.  She tells me that she has been able to tolerate some lower doses of prednisone in the past, despite its listing as an allergy in her allergy list in her medical record.  She understands that if she has difficulties at all while taking this she needs to stop it, and she can contact me at any point.    Olecranon Bursa Aspiration, L Verbal consent was obtained. Risks, benefits, and alternatives discussed. Potential complications including loss of pigment and atrophy were discussed and risk of infection. Discussed with the patient that risk of infection is higher with injection of corticosteroid into this superficial bursa. Prepped with Chloraprep and Ethyl Chloride used for anesthesia. Under sterile conditions, the effected olecranon bursa was aspirated with an 18 gauge needle. 8 amount of serosanguinous fluid was aspirated. No corticosteroid was injected.   A compression bandage was applied and the patient was instructed to continue for 72 hours.   Follow-up: if needed  Meds ordered this encounter  Medications  . predniSONE (DELTASONE) 10 MG tablet    Sig: 2 tabs po for 5 days, then 1 tab po for 5 days  Dispense:  15 tablet    Refill:  0   Signed,  Dayshaun Whobrey T. Journi Moffa, MD   Allergies as of 11/08/2017      Reactions   Prednisone Other (See Comments)   Agitation, hyperactivity, polyphagia   Hydrocodone-acetaminophen Other (See Comments)   Hallucinations and combative   Oxycodone Itching   Adhesive [tape] Itching, Rash   Latex Itching, Rash   use paper tape      Medication List        Accurate as of 11/08/17 11:59 PM. Always use your most recent med list.          amiodarone 200 MG tablet Commonly known as:  PACERONE Take 1 tablet (200 mg total) by mouth daily.   bisoprolol 5 MG tablet Commonly known as:  ZEBETA Take 1 tablet (5 mg total) by mouth daily.   calcium gluconate 500 MG tablet Take 500 tablets by mouth daily.   ELIQUIS 5 MG Tabs tablet Generic drug:  apixaban TAKE 1 TABLET BY MOUTH TWICE A DAY   esomeprazole 20 MG capsule Commonly known as:  NEXIUM Take 20 mg by mouth daily as needed (acid reflux).   fluvoxaMINE 50 MG tablet Commonly known as:  LUVOX Take 2.5 tablets (125 mg total) by mouth at bedtime.   furosemide 20 MG tablet Commonly known as:  LASIX TAKE 1 TABLET (20 MG TOTAL) BY MOUTH 2 (TWO) TIMES DAILY.   gabapentin 100 MG capsule Commonly known as:  NEURONTIN TAKE 2 CAPSULES BY MOUTH TWICE A DAY   hydrOXYzine 10 MG tablet Commonly known as:  ATARAX/VISTARIL Take 3 tablets (30 mg total) by mouth 2 (two) times daily as needed.   hydrOXYzine 25 MG capsule Commonly known as:  VISTARIL Take 1 capsule (25 mg total) by mouth 3 (three) times daily as needed (only for severe anxiety sx).   losartan 25 MG tablet Commonly known as:  COZAAR TAKE 1 TABLET BY MOUTH EVERY DAY   multivitamin tablet Take 1 tablet by mouth daily.   oxymetazoline 0.05 % nasal spray Commonly known as:  AFRIN Place 1 spray into both nostrils at bedtime as needed for congestion.   potassium chloride 10 MEQ tablet Commonly known as:  KLOR-CON 10 Take 1 tablet  (10 mEq total) by mouth 2 (two) times daily.   predniSONE 10 MG tablet Commonly known as:  DELTASONE 2 tabs po for 5 days, then 1 tab po for 5 days   RANEXA 500 MG 12 hr tablet Generic drug:  ranolazine TAKE 1 TABLET BY MOUTH TWICE A DAY   rizatriptan 10 MG tablet Commonly known as:  MAXALT TAKE 1 TABLET (10 MG TOTAL) BY MOUTH AS NEEDED FOR MIGRAINE. MAY REPEAT IN 2 HOURS IF NEEDED   rosuvastatin 20 MG tablet Commonly known as:  CRESTOR TAKE 1 TABLET BY MOUTH EVERY DAY   traZODone 50 MG tablet Commonly known as:  DESYREL Take 0.5-1 tablets (25-50 mg total) by mouth at bedtime.   VITAMIN B COMPLEX PO Take 1 tablet by mouth daily.   Vitamin D3 5000 units Caps Take 5,000 Units by mouth daily.

## 2017-11-09 ENCOUNTER — Ambulatory Visit: Payer: Medicare Other | Admitting: Psychology

## 2017-11-09 ENCOUNTER — Ambulatory Visit: Payer: Self-pay | Admitting: Internal Medicine

## 2017-11-09 DIAGNOSIS — F322 Major depressive disorder, single episode, severe without psychotic features: Secondary | ICD-10-CM

## 2017-11-09 DIAGNOSIS — F418 Other specified anxiety disorders: Secondary | ICD-10-CM | POA: Diagnosis not present

## 2017-11-10 ENCOUNTER — Telehealth: Payer: Self-pay

## 2017-11-10 NOTE — Telephone Encounter (Signed)
Left voicemail for patient to return call to office, need to know about her symptoms that she is having with the numbness and pain in her hands.

## 2017-11-14 ENCOUNTER — Encounter: Payer: Self-pay | Admitting: Psychiatry

## 2017-11-14 ENCOUNTER — Other Ambulatory Visit: Payer: Self-pay

## 2017-11-14 ENCOUNTER — Encounter: Payer: Self-pay | Admitting: Internal Medicine

## 2017-11-14 ENCOUNTER — Encounter (INDEPENDENT_AMBULATORY_CARE_PROVIDER_SITE_OTHER): Payer: Self-pay

## 2017-11-14 ENCOUNTER — Ambulatory Visit: Payer: Medicare Other | Admitting: Psychiatry

## 2017-11-14 ENCOUNTER — Ambulatory Visit (INDEPENDENT_AMBULATORY_CARE_PROVIDER_SITE_OTHER): Payer: Medicare Other | Admitting: Internal Medicine

## 2017-11-14 VITALS — BP 142/76 | HR 65 | Wt 328.0 lb

## 2017-11-14 VITALS — BP 140/90 | HR 60 | Temp 98.0°F | Ht 65.0 in | Wt 329.3 lb

## 2017-11-14 DIAGNOSIS — G629 Polyneuropathy, unspecified: Secondary | ICD-10-CM | POA: Diagnosis not present

## 2017-11-14 DIAGNOSIS — G2581 Restless legs syndrome: Secondary | ICD-10-CM | POA: Diagnosis not present

## 2017-11-14 DIAGNOSIS — F331 Major depressive disorder, recurrent, moderate: Secondary | ICD-10-CM

## 2017-11-14 DIAGNOSIS — M7022 Olecranon bursitis, left elbow: Secondary | ICD-10-CM | POA: Insufficient documentation

## 2017-11-14 DIAGNOSIS — F4310 Post-traumatic stress disorder, unspecified: Secondary | ICD-10-CM

## 2017-11-14 DIAGNOSIS — F410 Panic disorder [episodic paroxysmal anxiety] without agoraphobia: Secondary | ICD-10-CM | POA: Diagnosis not present

## 2017-11-14 DIAGNOSIS — M5412 Radiculopathy, cervical region: Secondary | ICD-10-CM | POA: Insufficient documentation

## 2017-11-14 MED ORDER — FLUVOXAMINE MALEATE 50 MG PO TABS: 125.0000 mg | ORAL_TABLET | Freq: Every day | ORAL | 2 refills | Status: DC

## 2017-11-14 MED ORDER — HYDROXYZINE HCL 10 MG PO TABS: 20.0000 mg | ORAL_TABLET | Freq: Two times a day (BID) | ORAL | 2 refills | Status: DC | PRN

## 2017-11-14 MED ORDER — TRAZODONE HCL 50 MG PO TABS: 25.0000 mg | ORAL_TABLET | Freq: Every day | ORAL | 2 refills | Status: DC

## 2017-11-14 MED ORDER — GABAPENTIN 400 MG PO CAPS: 400.0000 mg | ORAL_CAPSULE | Freq: Two times a day (BID) | ORAL | 1 refills | Status: DC

## 2017-11-14 NOTE — Patient Instructions (Addendum)
Try an elbow pad or pillow  Increase gabapentin to 400 mg 2x per day  We will refer to Neurology shock test to see if coming from elbow or neck  F/u with PCP in 3 months   Elbow Bursitis Elbow bursitis is inflammation of the fluid-filled sac (bursa) between the tip of your elbow bone (olecranon) and your skin. Elbow bursitis may also be called olecranon bursitis. Normally, the olecranon bursa has only a small amount of fluid in it to cushion and protect your elbow bone. Elbow bursitis causes fluid to build up inside the bursa. Over time, this swelling and inflammation can cause pain when you bend or lean on your elbow. What are the causes? Elbow bursitis may be caused by:  Elbow injury (acute trauma).  Leaning on hard surfaces for long periods of time.  Infection from an injury that breaks the skin near your elbow.  A bone growth (spur) that forms at the tip of your elbow.  A medical condition that causes inflammation in your body, such as gout or rheumatoid arthritis.  The cause may also be unknown. What are the signs or symptoms? The first sign of elbow bursitis is usually swelling over the tip of your elbow. This can grow to be the size of a golf ball. This may start suddenly or develop gradually. You may also have:  Pain when bending or leaning on your elbow.  Restricted movement of your elbow.  If your bursitis is caused by an infection, symptoms may also include:  Redness, warmth, and tenderness of the elbow.  Drainage of pus from the swollen area over your elbow, if the skin breaks open.  How is this diagnosed? Your health care provider may be able to diagnose elbow bursitis based on your signs and symptoms, especially if you have recently been injured. Your health care provider will also do a physical exam. This may include:  X-rays to look for a bone spur or a bone fracture.  Draining fluid from the bursa to test it for infection.  Blood tests to rule out gout or  rheumatoid arthritis.  How is this treated? Treatment for elbow bursitis depends on the cause. Treatment may include:  Medicines. These may include: ? Over-the-counter medicines to relieve pain and inflammation. ? Antibiotic medicines to fight infection. ? Injections of anti-inflammatory medicines (steroids).  Wrapping your elbow with a bandage.  Draining fluid from the bursa.  Wearing elbow pads.  If your bursitis does not get better with treatment, surgery may be needed to remove the bursa. Follow these instructions at home:  Take medicines only as directed by your health care provider.  If you were prescribed an antibiotic medicine, finish all of it even if you start to feel better.  If your bursitis is caused by an injury, rest your elbow and wear your bandage as directed by your health care provider. You may alsoapply ice to the injured area as directed by your health care provider: ? Put ice in a plastic bag. ? Place a towel between your skin and the bag. ? Leave the ice on for 20 minutes, 2-3 times per day.  Avoid any activities that cause elbow pain.  Use elbow pads or elbow wraps to cushion your elbow. Contact a health care provider if:  You have a fever.  Your symptoms do not get better with treatment.  Your pain or swelling gets worse.  Your elbow pain or swelling goes away and then returns.  You have drainage  of pus from the swollen area over your elbow. This information is not intended to replace advice given to you by your health care provider. Make sure you discuss any questions you have with your health care provider. Document Released: 07/27/2006 Document Revised: 12/03/2015 Document Reviewed: 03/05/2014 Elsevier Interactive Patient Education  [employer].
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
## 2017-11-14 NOTE — Progress Notes (Signed)
Chief Complaint  Patient presents with  . Follow-up    seen by Dr. Edilia Bo, sx have returned   F/u  1. Monday before 11/08/17 left elbow swelling saw Dr. Edilia Bo 11/08/17 aspirated, prednisone taper c/w tinging in left hand all fingers. Tried ACE wrap but broke skin out  Reviewed CT cervical with multilevel degenerative changes C5/6 and spinal stenosis 2/2 spurring C5/C6. She is on gabapentin 300 mg bid w/o relief which she takes for RLS    Review of Systems  Respiratory: Negative for shortness of breath.   Cardiovascular: Negative for chest pain.  Musculoskeletal:       Left elbow swelling   Neurological: Positive for tingling.       Numbness lue    Past Medical History:  Diagnosis Date  . Achilles tendinitis   . Acute medial meniscal tear   . Allergy   . Anxiety   . Arthritis   . Atrial fibrillation ([place])    a. new onset 07/2016; b. CHADS2VASc --> 2 (HTN, female); c. eliquis  . CHF (congestive heart failure) ([place])   . Chronic lower back pain   . COPD (chronic obstructive pulmonary disease) ([place])   . Depression   . Fibrocystic breast disease   . GERD (gastroesophageal reflux disease)   . Hyperlipidemia   . Hypertension   . Insomnia   . Lipoma   . Migraine   . Osteoarthritis    left knee  . Osteopenia   . Tendonitis   . Thyroid disease   . Vitamin D deficiency    Past Surgical History:  Procedure Laterality Date  . ABDOMINAL HYSTERECTOMY     ovaries left  . ACHILLES TENDON SURGERY     2012,01/2017  . BACK SURGERY    . CARDIOVERSION N/A 09/07/2016   Procedure: CARDIOVERSION;  Surgeon: Minna Merritts, MD;  Location: [facility] ORS;  Service: Cardiovascular;  Laterality: N/A;  . CARDIOVERSION N/A 10/19/2016   Procedure: CARDIOVERSION;  Surgeon: Minna Merritts, MD;  Location: [facility] ORS;  Service: Cardiovascular;  Laterality: N/A;  . CARDIOVERSION N/A 12/14/2016   Procedure: CARDIOVERSION;  Surgeon: Minna Merritts, MD;  Location: [facility] ORS;  Service: Cardiovascular;   Laterality: N/A;  . COLONOSCOPY    . ESOPHAGOGASTRODUODENOSCOPY    . THYROIDECTOMY     thyroid lobectomy secondary to a benign nodule   Family History  Problem Relation Age of Onset  . Cancer Sister 67       ovarian  . Depression Sister   . Cancer Father 87       colon  . Heart disease Father   . Heart attack Father 86       died from MI  . Hyperlipidemia Father   . Hypertension Father   . Cancer Mother        leukemia  . Breast cancer Paternal Aunt        40's  . Migraines Neg Hx    Social History   Socioeconomic History  . Marital status: Married    Spouse name: jeffrey  . Number of children: 1  . Years of education: Not on file  . Highest education level: High school graduate  Occupational History    Comment: disabled  Social Needs  . Financial resource strain: Somewhat hard  . Food insecurity:    Worry: Never true    Inability: Never true  . Transportation needs:    Medical: No    Non-medical: No  Tobacco Use  . Smoking status: Former  Smoker    Packs/day: 0.25    Years: 10.00    Pack years: 2.50    Types: Cigarettes    Last attempt to quit: 2005    Years since quitting: 14.3  . Smokeless tobacco: Former Systems developer    Quit date: 07/11/2001  . Tobacco comment: smoked for 10 years, 1 pack every 2-3 days  Substance and Sexual Activity  . Alcohol use: Not Currently    Comment: social, occasional  . Drug use: No  . Sexual activity: Not Currently  Lifestyle  . Physical activity:    Days per week: 0 days    Minutes per session: 0 min  . Stress: Very much  Relationships  . Social connections:    Talks on phone: Never    Gets together: Once a week    Attends religious service: More than 4 times per year    Active member of club or organization: No    Attends meetings of clubs or organizations: Never    Relationship status: Married  . Intimate partner violence:    Fear of current or ex partner: No    Emotionally abused: No    Physically abused: No    Forced  sexual activity: No  Other Topics Concern  . Not on file  Social History Narrative   Moved to [place] from [place], originally from [place].    1 cup of caffeine daily   Right handed          Current Meds  Medication Sig  . amiodarone (PACERONE) 200 MG tablet Take 1 tablet (200 mg total) by mouth daily.  . B Complex Vitamins (VITAMIN B COMPLEX PO) Take 1 tablet by mouth daily.   . bisoprolol (ZEBETA) 5 MG tablet Take 1 tablet (5 mg total) by mouth daily.  . calcium gluconate 500 MG tablet Take 500 tablets by mouth daily.  . Cholecalciferol (VITAMIN D3) 5000 units CAPS Take 5,000 Units by mouth daily.  Marland Kitchen ELIQUIS 5 MG TABS tablet TAKE 1 TABLET BY MOUTH TWICE A DAY  . esomeprazole (NEXIUM) 20 MG capsule Take 20 mg by mouth daily as needed (acid reflux).  . fluvoxaMINE (LUVOX) 50 MG tablet Take 2.5 tablets (125 mg total) by mouth at bedtime.  . furosemide (LASIX) 20 MG tablet TAKE 1 TABLET (20 MG TOTAL) BY MOUTH 2 (TWO) TIMES DAILY.  Marland Kitchen gabapentin (NEURONTIN) 100 MG capsule TAKE 2 CAPSULES BY MOUTH TWICE A DAY  . hydrOXYzine (ATARAX/VISTARIL) 10 MG tablet Take 3 tablets (30 mg total) by mouth 2 (two) times daily as needed.  . hydrOXYzine (VISTARIL) 25 MG capsule Take 1 capsule (25 mg total) by mouth 3 (three) times daily as needed (only for severe anxiety sx).  Marland Kitchen losartan (COZAAR) 25 MG tablet TAKE 1 TABLET BY MOUTH EVERY DAY  . Multiple Vitamin (MULTIVITAMIN) tablet Take 1 tablet by mouth daily.  Marland Kitchen oxymetazoline (AFRIN) 0.05 % nasal spray Place 1 spray into both nostrils at bedtime as needed for congestion.   . potassium chloride (KLOR-CON 10) 10 MEQ tablet Take 1 tablet (10 mEq total) by mouth 2 (two) times daily.  . predniSONE (DELTASONE) 10 MG tablet 2 tabs po for 5 days, then 1 tab po for 5 days  . RANEXA 500 MG 12 hr tablet TAKE 1 TABLET BY MOUTH TWICE A DAY  . rizatriptan (MAXALT) 10 MG tablet TAKE 1 TABLET (10 MG TOTAL) BY MOUTH AS NEEDED FOR MIGRAINE. MAY  REPEAT IN 2 HOURS IF NEEDED  .  rosuvastatin (CRESTOR) 20 MG tablet TAKE 1 TABLET BY MOUTH EVERY DAY  . traZODone (DESYREL) 50 MG tablet Take 0.5-1 tablets (25-50 mg total) by mouth at bedtime.   Allergies  Allergen Reactions  . Prednisone Other (See Comments)    Agitation, hyperactivity, polyphagia  . Hydrocodone-Acetaminophen Other (See Comments)    Hallucinations and combative  . Oxycodone Itching  . Adhesive [Tape] Itching and Rash  . Latex Itching and Rash    use paper tape   Recent Results (from the past 2160 hour(s))  POCT rapid strep A     Status: Abnormal   Collection Time: 09/14/17  2:41 PM  Result Value Ref Range   Rapid Strep A Screen Positive (A) Negative  Basic metabolic panel     Status: None   Collection Time: 09/28/17 10:02 AM  Result Value Ref Range   Sodium 141 135 - 145 mmol/L   Potassium 4.2 3.5 - 5.1 mmol/L   Chloride 103 101 - 111 mmol/L   CO2 28 22 - 32 mmol/L   Glucose, Bld 96 65 - 99 mg/dL   BUN 18 6 - 20 mg/dL   Creatinine, Ser 0.95 0.44 - 1.00 mg/dL   Calcium 8.9 8.9 - 10.3 mg/dL   GFR calc non Af Amer >60 >60 mL/min   GFR calc Af Amer >60 >60 mL/min    Comment: (NOTE) The eGFR has been calculated using the CKD EPI equation. This calculation has not been validated in all clinical situations. eGFR's persistently <60 mL/min signify possible Chronic Kidney Disease.    Anion gap 10 5 - 15    Comment: Performed at [facility], [address] [place], [place] [address]  CBC with Differential     Status: None   Collection Time: 09/28/17 10:02 AM  Result Value Ref Range   WBC 4.5 3.6 - 11.0 K/uL   RBC 4.60 3.80 - 5.20 MIL/uL   Hemoglobin 13.6 12.0 - 16.0 g/dL   HCT 41.4 35.0 - 47.0 %   MCV 89.9 80.0 - 100.0 fL   MCH 29.4 26.0 - 34.0 pg   MCHC 32.7 32.0 - 36.0 g/dL   RDW 14.5 11.5 - 14.5 %   Platelets 166 150 - 440 K/uL   Neutrophils Relative % 56 %   Neutro Abs 2.5 1.4 - 6.5 K/uL   Lymphocytes Relative 29 %   Lymphs Abs 1.3 1.0 -  3.6 K/uL   Monocytes Relative 11 %   Monocytes Absolute 0.5 0.2 - 0.9 K/uL   Eosinophils Relative 3 %   Eosinophils Absolute 0.1 0 - 0.7 K/uL   Basophils Relative 1 %   Basophils Absolute 0.0 0 - 0.1 K/uL    Comment: Performed at [facility], [address] [place], [place] [address]  Troponin I     Status: None   Collection Time: 09/28/17 10:02 AM  Result Value Ref Range   Troponin I <0.03 <0.03 ng/mL    Comment: Performed at [facility], [address] [place],  [address]  Urinalysis, Complete w Microscopic     Status: Abnormal   Collection Time: 09/28/17 10:02 AM  Result Value Ref Range   Color, Urine YELLOW (A) YELLOW   APPearance CLEAR (A) CLEAR   Specific Gravity, Urine 1.008 1.005 - 1.030   pH 6.0 5.0 - 8.0   Glucose, UA NEGATIVE NEGATIVE mg/dL   Hgb urine dipstick NEGATIVE NEGATIVE   Bilirubin Urine NEGATIVE NEGATIVE   Ketones, ur NEGATIVE NEGATIVE mg/dL  Protein, ur NEGATIVE NEGATIVE mg/dL   Nitrite NEGATIVE NEGATIVE   Leukocytes, UA NEGATIVE NEGATIVE   RBC / HPF 0-5 0 - 5 RBC/hpf   WBC, UA 0-5 0 - 5 WBC/hpf   Bacteria, UA NONE SEEN NONE SEEN   Squamous Epithelial / LPF 0-5 (A) NONE SEEN    Comment: Performed at [facility], [address] [place], [place] [address]   Objective  Body mass index is 54.8 kg/m. Wt Readings from Last 3 Encounters:  11/14/17 (!) 329 lb 4.8 oz (149.4 kg)  11/08/17 (!) 334 lb 8 oz (151.7 kg)  10/10/17 (!) 332 lb (150.6 kg)   Temp Readings from Last 3 Encounters:  11/14/17 98 F (36.7 C) (Oral)  11/08/17 97.9 F (36.6 C) (Oral)  09/28/17 97.7 F (36.5 C) (Oral)   BP Readings from Last 3 Encounters:  11/14/17 140/90  11/08/17 124/70  10/10/17 (!) 152/82   Pulse Readings from Last 3 Encounters:  11/14/17 60  11/08/17 (!) 58  10/10/17 (!) 53    Physical Exam  Constitutional: She is oriented to person, place, and time. Vital signs are normal. She appears well-developed. She is  cooperative.  HENT:  Head: Normocephalic and atraumatic.  Mouth/Throat: Oropharynx is clear and moist and mucous membranes are normal.  Eyes: Pupils are equal, round, and reactive to light. Conjunctivae are normal.  Cardiovascular: Normal rate, regular rhythm and normal heart sounds.  Pulmonary/Chest: Effort normal and breath sounds normal.  Musculoskeletal:       Left forearm: She exhibits swelling.  Left olecrenon bursitis   Neurological: She is alert and oriented to person, place, and time. Gait normal.  Skin: Skin is warm, dry and intact.  Psychiatric: She has a normal mood and affect. Her speech is normal and behavior is normal. Judgment and thought content normal. Cognition and memory are normal.  Nursing note and vitals reviewed.   Assessment   1. Left olecrenan bursitis with numbness/tingling in left hand ? Elbow neuropathy vs cervical radiculopathy  Plan  1. Try to get appt with Dr. Sharion Balloon Increase gabapentin to 400 mg bid  Continue prednisone taper  Unable to take NSAIDS May need to be re-aspirated  Try elbow pad  Referred NCS with neurology   Provider: Dr. Olivia Mackie [facility]

## 2017-11-14 NOTE — Progress Notes (Signed)
Pre visit review using our clinic review tool, if applicable. No additional management support is needed unless otherwise documented below in the visit note. 

## 2017-11-15 ENCOUNTER — Encounter: Payer: Self-pay | Admitting: Psychiatry

## 2017-11-21 ENCOUNTER — Ambulatory Visit
Admission: EM | Admit: 2017-11-21 | Discharge: 2017-11-21 | Disposition: A | Discharge status: Home / Self Care | Payer: Medicare Other | Attending: Family Medicine | Admitting: Family Medicine

## 2017-11-21 ENCOUNTER — Encounter: Payer: Self-pay | Admitting: Emergency Medicine

## 2017-11-21 ENCOUNTER — Other Ambulatory Visit: Payer: Self-pay

## 2017-11-21 ENCOUNTER — Telehealth: Payer: Self-pay

## 2017-11-21 ENCOUNTER — Ambulatory Visit (INDEPENDENT_AMBULATORY_CARE_PROVIDER_SITE_OTHER): Payer: Medicare Other

## 2017-11-21 DIAGNOSIS — R05 Cough: Secondary | ICD-10-CM

## 2017-11-21 DIAGNOSIS — R0602 Shortness of breath: Secondary | ICD-10-CM

## 2017-11-21 DIAGNOSIS — J40 Bronchitis, not specified as acute or chronic: Secondary | ICD-10-CM | POA: Diagnosis not present

## 2017-11-21 DIAGNOSIS — J029 Acute pharyngitis, unspecified: Secondary | ICD-10-CM

## 2017-11-21 LAB — RAPID STREP SCREEN (MED CTR MEBANE ONLY): Streptococcus, Group A Screen (Direct): NEGATIVE

## 2017-11-21 MED ORDER — DOXYCYCLINE HYCLATE 100 MG PO CAPS: 100.0000 mg | ORAL_CAPSULE | Freq: Two times a day (BID) | ORAL | 0 refills | Status: DC

## 2017-11-21 MED ORDER — BENZONATATE 100 MG PO CAPS: 100.0000 mg | ORAL_CAPSULE | Freq: Three times a day (TID) | ORAL | 0 refills | Status: DC | PRN

## 2017-11-21 NOTE — Telephone Encounter (Signed)
Copied from [place] [id number]. Topic: Appointment Scheduling - Scheduling Inquiry for Clinic >> Nov 21, 2017 11:05 AM Scherrie Gerlach wrote: Reason for CRM: pt has sore throat and requesting appt to be seen. No appts today or tomorrow.  Please advise if pt can come in. No appt at [facility] either. thanks  Called patient and informed her that we have no availability today, she states that she will go to [facility] to be seen for her sore throat.

## 2017-11-21 NOTE — ED Triage Notes (Signed)
Patient c/o cough, congestion and sore throat that started 2 days.

## 2017-11-21 NOTE — Discharge Instructions (Signed)
Meds as prescribed. ° °Take care ° °Dr. Justis Closser  °

## 2017-11-21 NOTE — ED Provider Notes (Signed)
[facility]    CSN: [id number] Arrival date & time: 11/21/17  1152  History   Chief Complaint Chief Complaint  Patient presents with  . Cough  . Sore Throat   HPI  67 year old female presents with cough, sore throat, and shortness of breath.  Patient has an extensive past medical history.  She reports a 2-day history of cough, shortness of breath, postnasal drip, and sore throat.  She states her primary complaint is the cough and shortness of breath.  No fever.  Symptoms are severe.  No known exacerbating relieving factors.  No other associated symptoms.  No other complaints.  Past Medical History:  Diagnosis Date  . Achilles tendinitis   . Acute medial meniscal tear   . Allergy   . Anxiety   . Arthritis   . Atrial fibrillation ([place])    a. new onset 07/2016; b. CHADS2VASc --> 2 (HTN, female); c. eliquis  . CHF (congestive heart failure) ([place])   . Chronic lower back pain   . COPD (chronic obstructive pulmonary disease) ([place])   . Depression   . Fibrocystic breast disease   . GERD (gastroesophageal reflux disease)   . Hyperlipidemia   . Hypertension   . Insomnia   . Lipoma   . Migraine   . Osteoarthritis    left knee  . Osteopenia   . Tendonitis   . Thyroid disease   . Vitamin D deficiency     Patient Active Problem List   Diagnosis Date Noted  . Olecranon bursitis of left elbow 11/14/2017  . Cervical radiculopathy 11/14/2017  . Atherosclerosis of aorta ([place]) 08/25/2017  . Migraine with aura and without status migrainosus, not intractable 06/27/2017  . Bradycardia 05/22/2017  . Depression, recurrent ([place]) 05/22/2017  . Restless leg 04/13/2017  . OSA (obstructive sleep apnea) 01/09/2017  . Acute pain of left shoulder 11/28/2016  . Persistent atrial fibrillation ([place]) 08/11/2016  . Acute pulmonary edema (HCC)   . New onset a-fib ([place]) 07/28/2016  . Hypokalemia 07/28/2016  . Left foot pain 07/28/2016  . Atypical chest pain   . Fatty liver  06/30/2016  . Dizziness 06/08/2016  . Family history of ovarian cancer 06/08/2016  . GERD (gastroesophageal reflux disease) 05/05/2016  . Seasonal rhinitis 05/05/2016  . Routine physical examination 05/05/2016  . Heel spur 11/05/2015  . Tendonitis 11/05/2015  . SOB (shortness of breath) 11/11/2014  . Morbid obesity ([place]) 11/11/2014  . Mixed hyperlipidemia 11/11/2014  . Essential hypertension 11/11/2014  . Anxiety and depression 11/11/2014  . Arthritis, senescent 11/11/2014  . Weight loss due to medication 11/11/2014  . Obesity, Class III, BMI 40-49.9 (morbid obesity) ([place]) 04/11/2013    Past Surgical History:  Procedure Laterality Date  . ABDOMINAL HYSTERECTOMY     ovaries left  . ACHILLES TENDON SURGERY     2012,01/2017  . BACK SURGERY    . CARDIOVERSION N/A 09/07/2016   Procedure: CARDIOVERSION;  Surgeon: Minna Merritts, MD;  Location: [facility];  Service: Cardiovascular;  Laterality: N/A;  . CARDIOVERSION N/A 10/19/2016   Procedure: CARDIOVERSION;  Surgeon: Minna Merritts, MD;  Location: [facility];  Service: Cardiovascular;  Laterality: N/A;  . CARDIOVERSION N/A 12/14/2016   Procedure: CARDIOVERSION;  Surgeon: Minna Merritts, MD;  Location: [facility];  Service: Cardiovascular;  Laterality: N/A;  . COLONOSCOPY    . ESOPHAGOGASTRODUODENOSCOPY    . THYROIDECTOMY     thyroid lobectomy secondary to a benign nodule    OB History    [place]  0   Para  0   Term  0   Preterm  0   AB  0   Living        SAB  0   TAB  0   Ectopic  0   Multiple      Live Births               Home Medications    Prior to Admission medications   Medication Sig Start Date End Date Taking? Authorizing Provider  amiodarone (PACERONE) 200 MG tablet Take 1 tablet (200 mg total) by mouth daily. 07/24/17  Yes Gollan, [name], MD  B Complex Vitamins (VITAMIN B COMPLEX PO) Take 1 tablet by mouth daily.    Yes [provider]  bisoprolol (ZEBETA) 5 MG tablet Take 1 tablet  (5 mg total) by mouth daily. 07/24/17  Yes Minna Merritts, MD  calcium gluconate 500 MG tablet Take 500 tablets by mouth daily.   Yes [provider]  Cholecalciferol (VITAMIN D3) 5000 units CAPS Take 5,000 Units by mouth daily.   Yes [provider]  ELIQUIS 5 MG TABS tablet TAKE 1 TABLET BY MOUTH TWICE A DAY 08/07/17  Yes Gollan, [name], MD  esomeprazole (NEXIUM) 20 MG capsule Take 20 mg by mouth daily as needed (acid reflux).   Yes [provider]  fluvoxaMINE (LUVOX) 50 MG tablet Take 2.5 tablets (125 mg total) by mouth at bedtime. 11/14/17  Yes Eappen, Ria Clock, MD  furosemide (LASIX) 20 MG tablet TAKE 1 TABLET (20 MG TOTAL) BY MOUTH 2 (TWO) TIMES DAILY. 10/13/17  Yes Gollan, [name], MD  gabapentin (NEURONTIN) 400 MG capsule Take 1 capsule (400 mg total) by mouth 2 (two) times daily. 11/14/17  Yes McLean-Scocuzza, Nino Glow, MD  hydrOXYzine (ATARAX/VISTARIL) 10 MG tablet Take 2 tablets (20 mg total) by mouth 2 (two) times daily as needed. 11/14/17  Yes Ursula Alert, MD  losartan (COZAAR) 25 MG tablet TAKE 1 TABLET BY MOUTH EVERY DAY 09/06/17  Yes Arnett, Yvetta Coder, FNP  Multiple Vitamin (MULTIVITAMIN) tablet Take 1 tablet by mouth daily.   Yes [provider]  oxymetazoline (AFRIN) 0.05 % nasal spray Place 1 spray into both nostrils at bedtime as needed for congestion.    Yes [provider]  potassium chloride (KLOR-CON 10) 10 MEQ tablet Take 1 tablet (10 mEq total) by mouth 2 (two) times daily. 05/10/17  Yes Gollan, [name], MD  RANEXA 500 MG 12 hr tablet TAKE 1 TABLET BY MOUTH TWICE A DAY 11/01/17  Yes Gollan, [name], MD  rizatriptan (MAXALT) 10 MG tablet TAKE 1 TABLET (10 MG TOTAL) BY MOUTH AS NEEDED FOR MIGRAINE. MAY REPEAT IN 2 HOURS IF NEEDED 10/06/17  Yes Melvenia Beam, MD  rosuvastatin (CRESTOR) 20 MG tablet TAKE 1 TABLET BY MOUTH EVERY DAY 10/24/17  Yes [name], FNP  traZODone (DESYREL) 50 MG tablet Take 0.5-1 tablets (25-50  mg total) by mouth at bedtime. sleep 11/14/17  Yes Eappen, Ria Clock, MD  benzonatate (TESSALON) 100 MG capsule Take 1 capsule (100 mg total) by mouth 3 (three) times daily as needed. 11/21/17   Coral Spikes, DO  doxycycline (VIBRAMYCIN) 100 MG capsule Take 1 capsule (100 mg total) by mouth 2 (two) times daily. 11/21/17   Coral Spikes, DO    Family History Family History  Problem Relation Age of Onset  . Cancer Sister 58       ovarian  . Depression  Sister   . Cancer Father 21       colon  . Heart disease Father   . Heart attack Father 61       died from MI  . Hyperlipidemia Father   . Hypertension Father   . Cancer Mother        leukemia  . Breast cancer Paternal Aunt        22's  . Migraines Neg Hx     Social History Social History   Tobacco Use  . Smoking status: Former Smoker    Packs/day: 0.25    Years: 10.00    Pack years: 2.50    Types: Cigarettes    Last attempt to quit: 2005    Years since quitting: 14.3  . Smokeless tobacco: Former Systems developer    Quit date: 07/11/2001  . Tobacco comment: smoked for 10 years, 1 pack every 2-3 days  Substance Use Topics  . Alcohol use: Not Currently    Comment: social, occasional  . Drug use: No   Allergies   Prednisone; Hydrocodone-acetaminophen; Oxycodone; Adhesive [tape]; and Latex  Review of Systems Review of Systems  Constitutional: Negative for fever.  HENT: Positive for postnasal drip and sore throat.   Respiratory: Positive for cough and shortness of breath.    Physical Exam Triage Vital Signs ED Triage Vitals  Enc Vitals Group     BP 11/21/17 1203 (!) 154/86     Pulse Rate 11/21/17 1203 60     Resp 11/21/17 1203 17     Temp 11/21/17 1203 98.4 F (36.9 C)     Temp Source 11/21/17 1203 Oral     SpO2 11/21/17 1203 95 %     Weight 11/21/17 1200 (!) 335 lb (152 kg)     Height 11/21/17 1200 5\' 5"  (1.651 m)     Head Circumference --      Peak Flow --      Pain Score 11/21/17 1200 8     Pain Loc --      Pain Edu? --        Excl. in [place]? --    Updated Vital Signs BP (!) 154/86 (BP Location: Left Arm)   Pulse 60   Temp 98.4 F (36.9 C) (Oral)   Resp 17   Ht 5\' 5"  (1.651 m)   Wt (!) 335 lb (152 kg)   SpO2 95%   BMI 55.75 kg/m   Visual Acuity Right Eye Distance:   Left Eye Distance:   Bilateral Distance:    Right Eye Near:   Left Eye Near:    Bilateral Near:     Physical Exam  Constitutional: She is oriented to person, place, and time. She appears well-developed. No distress.  HENT:  Oropharynx with mild erythema.  Normal TMs bilaterally.  Cardiovascular: Normal rate and regular rhythm.  Pulmonary/Chest: Effort normal.  Right-sided wheezing.  Neurological: She is alert and oriented to person, place, and time.  Psychiatric: She has a normal mood and affect. Her behavior is normal.  Nursing note and vitals reviewed.  UC Treatments / Results  Labs (all labs ordered are listed, but only abnormal results are displayed) Labs Reviewed  RAPID STREP SCREEN ([facility] ONLY)  CULTURE, GROUP A STREP [facility])    EKG None  Radiology Dg Chest 2 View  Result Date: 11/21/2017 CLINICAL DATA:  Shortness of breath with productive cough EXAM: CHEST - 2 VIEW COMPARISON:  09/28/2017 FINDINGS: The heart size and mediastinal contours are within  normal limits. Both lungs are clear. The visualized skeletal structures are unremarkable. IMPRESSION: No active cardiopulmonary disease. Electronically Signed   By: Inez Catalina M.D.   On: 11/21/2017 12:43    Procedures Procedures (including critical care time)  Medications Ordered in UC Medications - No data to display  Initial Impression / Assessment and Plan / UC Course  I have reviewed the triage vital signs and the nursing notes.  Pertinent labs & imaging results that were available during my care of the patient were reviewed by me and considered in my medical decision making (see chart for details).    67 year old female presents with a clinical picture  consistent with bronchitis.  She has a documented history of COPD.  So technically this is a COPD exacerbation.  X-ray was clear.  Strep negative.  Treating with Doxy and Tessalon Perles.  Patient has allergy to prednisone so cannot prescribe.  Final Clinical Impressions(s) / UC Diagnoses   Final diagnoses:  Bronchitis     Discharge Instructions     Meds as prescribed.  Take care  Dr. Lacinda Axon    ED Prescriptions    Medication Sig Dispense Auth. Provider   benzonatate (TESSALON) 100 MG capsule Take 1 capsule (100 mg total) by mouth 3 (three) times daily as needed. 30 capsule Derrell Milanes G, DO   doxycycline (VIBRAMYCIN) 100 MG capsule Take 1 capsule (100 mg total) by mouth 2 (two) times daily. 14 capsule Coral Spikes, DO     Controlled Substance Prescriptions [place] Controlled Substance Registry consulted? Not Applicable   Coral Spikes, DO 11/21/17 1331

## 2017-11-23 LAB — CULTURE, GROUP A STREP (THRC)

## 2017-11-27 ENCOUNTER — Encounter: Payer: Self-pay | Admitting: Family

## 2017-11-28 ENCOUNTER — Other Ambulatory Visit: Payer: Self-pay | Admitting: Psychiatry

## 2017-11-28 ENCOUNTER — Other Ambulatory Visit: Payer: Self-pay | Admitting: Cardiovascular Disease

## 2017-11-28 DIAGNOSIS — F331 Major depressive disorder, recurrent, moderate: Secondary | ICD-10-CM

## 2017-11-28 DIAGNOSIS — F4310 Post-traumatic stress disorder, unspecified: Secondary | ICD-10-CM

## 2017-11-28 NOTE — Telephone Encounter (Signed)
Please review for refill, Thanks !  

## 2017-11-29 ENCOUNTER — Ambulatory Visit: Payer: Medicare Other | Admitting: Psychology

## 2017-11-29 ENCOUNTER — Other Ambulatory Visit: Payer: Self-pay | Admitting: Family

## 2017-11-29 DIAGNOSIS — F418 Other specified anxiety disorders: Secondary | ICD-10-CM

## 2017-11-29 DIAGNOSIS — F322 Major depressive disorder, single episode, severe without psychotic features: Secondary | ICD-10-CM

## 2017-11-29 DIAGNOSIS — R05 Cough: Secondary | ICD-10-CM

## 2017-11-29 DIAGNOSIS — R059 Cough, unspecified: Secondary | ICD-10-CM

## 2017-11-29 MED ORDER — HYDROCODONE-HOMATROPINE 5-1.5 MG/5ML PO SYRP: 5.0000 mL | ORAL_SOLUTION | Freq: Every evening | ORAL | 0 refills | Status: DC | PRN

## 2017-11-29 NOTE — Progress Notes (Signed)
close

## 2017-12-08 DIAGNOSIS — R202 Paresthesia of skin: Secondary | ICD-10-CM | POA: Insufficient documentation

## 2017-12-08 DIAGNOSIS — R200 Anesthesia of skin: Secondary | ICD-10-CM | POA: Insufficient documentation

## 2017-12-11 ENCOUNTER — Ambulatory Visit: Payer: Medicare Other | Admitting: Family Medicine

## 2017-12-12 ENCOUNTER — Telehealth: Payer: Self-pay | Admitting: Cardiovascular Disease

## 2017-12-12 NOTE — Telephone Encounter (Signed)
Spoke with Dr. Chrisandra Carota and she wanted to know if it was ok to extract tooth. Patient is on Eliquis and did hold since yesterday. Reviewed that in some cases they don't hold but in cases where they do it is usually 2 days. Discussed increased bleeding risks associated with this medication and she is aware. She was very appreciative that we were able to take the call and had no further questions at this time.

## 2017-12-12 NOTE — Telephone Encounter (Signed)
She can hold eliquis for 2 days if needed by dentist Could restart after procedure complete

## 2017-12-12 NOTE — Telephone Encounter (Signed)
Pt is having tooth extraction and Dr. Chrisandra Carota needs to make sure it is ok with Dr. [name]. Please advise.

## 2017-12-13 NOTE — Telephone Encounter (Signed)
No answer. Left detailed message with dentist office secure voicemail with Dr [name] recommendations and to call back if any further questions.

## 2017-12-27 ENCOUNTER — Ambulatory Visit: Payer: Medicare Other | Admitting: Psychology

## 2017-12-30 ENCOUNTER — Other Ambulatory Visit: Payer: Self-pay | Admitting: Psychiatry

## 2017-12-30 DIAGNOSIS — F331 Major depressive disorder, recurrent, moderate: Secondary | ICD-10-CM

## 2017-12-30 DIAGNOSIS — F4310 Post-traumatic stress disorder, unspecified: Secondary | ICD-10-CM

## 2017-12-31 ENCOUNTER — Other Ambulatory Visit: Payer: Self-pay | Admitting: Cardiovascular Disease

## 2018-01-02 ENCOUNTER — Other Ambulatory Visit: Payer: Self-pay | Admitting: Psychiatry

## 2018-01-02 DIAGNOSIS — F4310 Post-traumatic stress disorder, unspecified: Secondary | ICD-10-CM

## 2018-01-02 DIAGNOSIS — F331 Major depressive disorder, recurrent, moderate: Secondary | ICD-10-CM

## 2018-01-05 ENCOUNTER — Ambulatory Visit: Payer: Medicare Other | Admitting: Psychology

## 2018-01-05 DIAGNOSIS — F322 Major depressive disorder, single episode, severe without psychotic features: Secondary | ICD-10-CM | POA: Diagnosis not present

## 2018-01-05 DIAGNOSIS — F418 Other specified anxiety disorders: Secondary | ICD-10-CM

## 2018-01-20 ENCOUNTER — Other Ambulatory Visit: Payer: Self-pay | Admitting: Family

## 2018-01-21 ENCOUNTER — Other Ambulatory Visit: Payer: Self-pay | Admitting: Family

## 2018-01-21 DIAGNOSIS — K760 Fatty (change of) liver, not elsewhere classified: Secondary | ICD-10-CM

## 2018-01-26 ENCOUNTER — Ambulatory Visit: Payer: Medicare Other | Admitting: Psychology

## 2018-01-26 DIAGNOSIS — F322 Major depressive disorder, single episode, severe without psychotic features: Secondary | ICD-10-CM

## 2018-01-26 DIAGNOSIS — F418 Other specified anxiety disorders: Secondary | ICD-10-CM

## 2018-01-27 ENCOUNTER — Other Ambulatory Visit: Payer: Self-pay | Admitting: Psychiatry

## 2018-02-01 ENCOUNTER — Encounter: Payer: Self-pay | Admitting: Family

## 2018-02-01 ENCOUNTER — Ambulatory Visit (INDEPENDENT_AMBULATORY_CARE_PROVIDER_SITE_OTHER): Payer: Medicare Other | Admitting: Family

## 2018-02-01 VITALS — BP 142/76 | HR 56 | Temp 98.7°F | Resp 17 | Ht 65.0 in | Wt 327.8 lb

## 2018-02-01 DIAGNOSIS — G8929 Other chronic pain: Secondary | ICD-10-CM

## 2018-02-01 DIAGNOSIS — M5442 Lumbago with sciatica, left side: Secondary | ICD-10-CM | POA: Diagnosis not present

## 2018-02-01 DIAGNOSIS — Z1231 Encounter for screening mammogram for malignant neoplasm of breast: Secondary | ICD-10-CM

## 2018-02-01 DIAGNOSIS — M5441 Lumbago with sciatica, right side: Secondary | ICD-10-CM | POA: Diagnosis not present

## 2018-02-01 MED ORDER — TRAMADOL HCL 50 MG PO TABS: 50.0000 mg | ORAL_TABLET | Freq: Three times a day (TID) | ORAL | 1 refills | Status: DC | PRN

## 2018-02-01 NOTE — Patient Instructions (Addendum)
Lets try tramadol for short term and get you to orthopedics as soon as we can  Today we discussed referrals, orders. orthopedics   I have placed these orders in the system for you.  Please be sure to give [place] a call if you have not heard from our office regarding scheduling a test or regarding referral in a timely manner.  It is very important that you let me know as soon as possible.   Please let me know if your pain worsens or new symptoms develop

## 2018-02-01 NOTE — Progress Notes (Signed)
Subjective:    Patient ID: Amber Barnes, female    DOB: [birth year], 67 y.o.   MRN: [id number]  CC: Amber Barnes is a 67 y.o. female who presents today for an acute visit.    HPI: CC: low back pain for 3-4 months, worsening.  NO injury. Pain radiates to legs. Doing water aerobics and feels like making worse. Tingling from posterior leg to toes,  More so on right side. Gabapentin not helping low back however helps RLS.   Right hip for the past couple of weeks, unchanged. Radiates to right groin. No urinary or bowel incontinence. No saddle anesthesia. Able to sleep on right side.   Rates pain 8/10. Tyelonol arthritis is not helping; notes it had helped in the past.   Epidural injection right hip 3-4 years ago, with relief until now.   H/o low back surgery- describes fusion. No hardware.  Given oral prednisone recently for left elbow; tolerated well.   No DM.  No h/o seizure. Occasional alcohol use.   Due for mammogram  H/o atrial fib- on eliquis  Saw Dr Candelaria Stagers for left elbow pain, improved.  09/2017 XR pelvis and lumbar- diffuse DDD SI, hip, lumbar spine HISTORY:  Past Medical History:  Diagnosis Date  . Achilles tendinitis   . Acute medial meniscal tear   . Allergy   . Anxiety   . Arthritis   . Atrial fibrillation ([place])    a. new onset 07/2016; b. CHADS2VASc --> 2 (HTN, female); c. eliquis  . CHF (congestive heart failure) ([place])   . Chronic lower back pain   . COPD (chronic obstructive pulmonary disease) ([place])   . Depression   . Fibrocystic breast disease   . GERD (gastroesophageal reflux disease)   . Hyperlipidemia   . Hypertension   . Insomnia   . Lipoma   . Migraine   . Osteoarthritis    left knee  . Osteopenia   . Tendonitis   . Thyroid disease   . Vitamin D deficiency    Past Surgical History:  Procedure Laterality Date  . ABDOMINAL HYSTERECTOMY     ovaries left  . ACHILLES TENDON SURGERY     2012,01/2017  . BACK SURGERY    . CARDIOVERSION  N/A 09/07/2016   Procedure: CARDIOVERSION;  Surgeon: Minna Merritts, MD;  Location: [facility] OR[facility];  Service: Cardiovascular;  Laterality: N/A;  . CARDIOVERSION N/A 10/19/2016   Procedure: CARDIOVERSION;  Surgeon: Minna Merritts, MD;  Location: [facility] OR[facility];  Service: Cardiovascular;  Laterality: N/A;  . CARDIOVERSION N/A 12/14/2016   Procedure: CARDIOVERSION;  Surgeon: Minna Merritts, MD;  Location: [facility] OR[facility];  Service: Cardiovascular;  Laterality: N/A;  . COLONOSCOPY    . ESOPHAGOGASTRODUODENOSCOPY    . THYROIDECTOMY     thyroid lobectomy secondary to a benign nodule   Family History  Problem Relation Age of Onset  . Cancer Sister 69       ovarian  . Depression Sister   . Cancer Father 73       colon  . Heart disease Father   . Heart attack Father 40       died from MI  . Hyperlipidemia Father   . Hypertension Father   . Cancer Mother        leukemia  . Breast cancer Paternal Aunt        58's  . Migraines Neg Hx     Allergies: Prednisone; Hydrocodone-acetaminophen; Oxycodone; Adhesive [tape]; and Latex Current Outpatient Medications on File Prior  to Visit  Medication Sig Dispense Refill  . amiodarone (PACERONE) 200 MG tablet Take 1 tablet (200 mg total) by mouth daily. 90 tablet 3  . B Complex Vitamins (VITAMIN B COMPLEX PO) Take 1 tablet by mouth daily.     . bisoprolol (ZEBETA) 5 MG tablet Take 1 tablet (5 mg total) by mouth daily. 90 tablet 3  . calcium gluconate 500 MG tablet Take 500 tablets by mouth daily.    . Cholecalciferol (VITAMIN D3) 5000 units CAPS Take 5,000 Units by mouth daily.    Marland Kitchen ELIQUIS 5 MG TABS tablet TAKE 1 TABLET BY MOUTH TWICE A DAY 60 tablet 3  . esomeprazole (NEXIUM) 20 MG capsule Take 20 mg by mouth daily as needed (acid reflux).    . fluvoxaMINE (LUVOX) 50 MG tablet Take 2.5 tablets (125 mg total) by mouth at bedtime. 75 tablet 2  . furosemide (LASIX) 20 MG tablet TAKE 1 TABLET (20 MG TOTAL) BY MOUTH 2 (TWO) TIMES DAILY. 180 tablet 3  . gabapentin  (NEURONTIN) 400 MG capsule Take 1 capsule (400 mg total) by mouth 2 (two) times daily. 180 capsule 1  . hydrOXYzine (ATARAX/VISTARIL) 10 MG tablet Take 2 tablets (20 mg total) by mouth 2 (two) times daily as needed. 60 tablet 2  . losartan (COZAAR) 25 MG tablet TAKE 1 TABLET BY MOUTH EVERY DAY 30 tablet 6  . Multiple Vitamin (MULTIVITAMIN) tablet Take 1 tablet by mouth daily.    Marland Kitchen oxymetazoline (AFRIN) 0.05 % nasal spray Place 1 spray into both nostrils at bedtime as needed for congestion.     . potassium chloride (KLOR-CON 10) 10 MEQ tablet Take 1 tablet (10 mEq total) by mouth 2 (two) times daily. 60 tablet 2  . ranolazine (RANEXA) 500 MG 12 hr tablet TAKE 1 TABLET BY MOUTH TWICE A DAY 60 tablet 3  . rizatriptan (MAXALT) 10 MG tablet TAKE 1 TABLET (10 MG TOTAL) BY MOUTH AS NEEDED FOR MIGRAINE. MAY REPEAT IN 2 HOURS IF NEEDED 10 tablet 0  . rosuvastatin (CRESTOR) 20 MG tablet TAKE 1 TABLET BY MOUTH EVERY DAY 90 tablet 0  . traZODone (DESYREL) 50 MG tablet Take 0.5-1 tablets (25-50 mg total) by mouth at bedtime. sleep 30 tablet 2   No current facility-administered medications on file prior to visit.     Social History   Tobacco Use  . Smoking status: Former Smoker    Packs/day: 0.25    Years: 10.00    Pack years: 2.50    Types: Cigarettes    Last attempt to quit: 2005    Years since quitting: 14.5  . Smokeless tobacco: Former Systems developer    Quit date: 07/11/2001  . Tobacco comment: smoked for 10 years, 1 pack every 2-3 days  Substance Use Topics  . Alcohol use: Not Currently    Comment: social, occasional  . Drug use: No    Review of Systems  Constitutional: Negative for chills and fever.  Respiratory: Negative for cough.   Cardiovascular: Negative for chest pain and palpitations.  Gastrointestinal: Negative for nausea and vomiting.  Genitourinary: Negative for difficulty urinating and dysuria.  Musculoskeletal: Positive for back pain.  Neurological: Positive for numbness.        Objective:    BP (!) 142/76 (BP Location: Left Arm, Patient Position: Sitting, Cuff Size: Large)   Pulse (!) 56   Temp 98.7 F (37.1 C) (Oral)   Resp 17   Ht 5\' 5"  (1.651 m)   Wt (!) 327  lb 12.8 oz (148.7 kg)   SpO2 93%   BMI 54.55 kg/m    Physical Exam  Constitutional: She appears well-developed and well-nourished.  Eyes: Conjunctivae are normal.  Cardiovascular: Normal rate, regular rhythm, normal heart sounds and normal pulses.  Pulmonary/Chest: Effort normal and breath sounds normal. She has no wheezes. She has no rhonchi. She has no rales.  Musculoskeletal:       Lumbar back: She exhibits tenderness. She exhibits normal range of motion, no bony tenderness, no swelling, no edema, no pain and no spasm.       Back:  Full range of motion with flexion, tension, lateral side bends. No bony tenderness. No pain, numbness, tingling elicited with single leg raise bilaterally.  Pain of over right SI joint with palpation  Right Hip: No limp or waddling gait. Full ROM with flexion and hip rotation in flexion.    Slight pain of lateral hip with  (flexion-abduction-external rotation) test appreciated in groin.   No pain with deep palpation of greater trochanter.    Neurological: She is alert. She has normal strength. No sensory deficit.  Reflex Scores:      Patellar reflexes are 2+ on the right side and 2+ on the left side. Sensation and strength intact bilateral lower extremities.  Skin: Skin is warm and dry.  Psychiatric: She has a normal mood and affect. Her speech is normal and behavior is normal. Thought content normal.  Vitals reviewed.      Assessment & Plan:   Problem List Items Addressed This Visit      Nervous and Auditory   Chronic right-sided low back pain with bilateral sciatica - Primary    Acute on chronic.  Known diffuse degenerative disc disease as per x-ray done earlier this year.  Due to patient's history of atrial fib and elevated blood pressure ( suspect  pain contributory), I am hesitant to give prednisone orally today.  I am given her short course of tramadol for acute pain relief.  Also placed a referral to orthopedics as she suspect would benefit from possibly a steroid injection and further imaging.  Will follow.  I looked up patient on [place] Controlled Substances Reporting System and saw no activity that raised concern of inappropriate use.        Relevant Medications   traMADol (ULTRAM) 50 MG tablet   Other Relevant Orders   Ambulatory referral to Orthopedic Surgery     Other   Screening mammogram, encounter for    Ordered, patient understands to schedule      Relevant Orders   MM 3D SCREEN BREAST BILATERAL         I have discontinued Erandy Alers's benzonatate, doxycycline, and HYDROcodone-homatropine. I am also having her start on traMADol. Additionally, I am having her maintain her multivitamin, B Complex Vitamins (VITAMIN B COMPLEX PO), oxymetazoline, Vitamin D3, esomeprazole, calcium gluconate, potassium chloride, amiodarone, bisoprolol, rizatriptan, furosemide, gabapentin, fluvoxaMINE, traZODone, hydrOXYzine, ELIQUIS, ranolazine, losartan, and rosuvastatin.   Meds ordered this encounter  Medications  . traMADol (ULTRAM) 50 MG tablet    Sig: Take 1 tablet (50 mg total) by mouth every 8 (eight) hours as needed.    Dispense:  30 tablet    Refill:  1    Order Specific Question:   Supervising Provider    Answer:   Crecencio Mc [[id number]]    Return precautions given.   Risks, benefits, and alternatives of the medications and treatment plan prescribed today were discussed, and patient expressed  understanding.   Education regarding symptom management and diagnosis given to patient on AVS.  Continue to follow with [name], FNP for routine health maintenance.   [name] and I agreed with plan.   [name], FNP

## 2018-02-02 DIAGNOSIS — Z1231 Encounter for screening mammogram for malignant neoplasm of breast: Secondary | ICD-10-CM | POA: Insufficient documentation

## 2018-02-02 NOTE — Assessment & Plan Note (Signed)
Acute on chronic.  Known diffuse degenerative disc disease as per x-ray done earlier this year.  Due to patient's history of atrial fib and elevated blood pressure ( suspect pain contributory), I am hesitant to give prednisone orally today.  I am given her short course of tramadol for acute pain relief.  Also placed a referral to orthopedics as she suspect would benefit from possibly a steroid injection and further imaging.  Will follow.  I looked up patient on [place] Controlled Substances Reporting System and saw no activity that raised concern of inappropriate use.

## 2018-02-02 NOTE — Assessment & Plan Note (Signed)
Ordered, patient understands to schedule

## 2018-02-08 ENCOUNTER — Telehealth (HOSPITAL_COMMUNITY): Payer: Self-pay

## 2018-02-08 NOTE — Telephone Encounter (Signed)
Received refill request for Hydroxyzine 10mg  tabs. No future appointment is scheduled

## 2018-02-09 ENCOUNTER — Telehealth: Payer: Self-pay | Admitting: Family

## 2018-02-09 NOTE — Telephone Encounter (Signed)
rx request 

## 2018-02-09 NOTE — Telephone Encounter (Signed)
Copied from [place] [id number]. Topic: General - Other >> Feb 09, 2018 11:34 AM Lennox Solders wrote: Reason for CRM: pt saw arnett on 7/25 and was given tramadol. Pt is calling back and per pt she said arnett will give her something else for back pain. Pt did see orthopaedic today and they are setting her up for MRI . [facility] [address] in [place]. The tramadol is not helping back pain

## 2018-02-12 ENCOUNTER — Telehealth: Payer: Self-pay | Admitting: Psychiatry

## 2018-02-12 ENCOUNTER — Encounter: Payer: Self-pay | Admitting: Family

## 2018-02-12 ENCOUNTER — Other Ambulatory Visit: Payer: Self-pay | Admitting: Student

## 2018-02-12 DIAGNOSIS — M545 Low back pain: Secondary | ICD-10-CM

## 2018-02-12 MED ORDER — HYDROXYZINE HCL 10 MG PO TABS: 20.0000 mg | ORAL_TABLET | Freq: Two times a day (BID) | ORAL | 1 refills | Status: DC | PRN

## 2018-02-12 NOTE — Telephone Encounter (Signed)
Vistaril sent to pharmacy

## 2018-02-13 NOTE — Telephone Encounter (Signed)
Call pt-  I would like to trial muscle relaxant or prednisone to ease pain.  Please call in after speaking with patient - Flexeril 5mg  TID, 30 tablets , 1 refill.  Do not take with gabapentin or alcohol due to sedation. Do not drive or operate machinery as muscle relaxants are sedating.  We may also try short prednisone taper. She may take prednisone 10 mg taper. Take 40mg  prednisone day 1 Take 30 mg pre dnisone day 2 Take 20 mg prednisone day 3 Take 10 mg prednisone day 4  Please start prednisone tomorrow as it affects sleep. Please monitor BP while on this medication

## 2018-02-14 NOTE — Telephone Encounter (Signed)
mychart message sent. Patient read [employer]

## 2018-02-23 ENCOUNTER — Ambulatory Visit: Admission: RE | Admit: 2018-02-23 | Payer: Medicare Other | Source: Ambulatory Visit

## 2018-02-23 ENCOUNTER — Ambulatory Visit
Admission: RE | Admit: 2018-02-23 | Discharge: 2018-02-23 | Disposition: A | Discharge status: Home / Self Care | Payer: Medicare Other | Source: Ambulatory Visit | Attending: Student | Admitting: Student

## 2018-02-23 DIAGNOSIS — M5136 Other intervertebral disc degeneration, lumbar region: Secondary | ICD-10-CM | POA: Diagnosis not present

## 2018-02-23 DIAGNOSIS — G8929 Other chronic pain: Secondary | ICD-10-CM | POA: Diagnosis present

## 2018-02-23 DIAGNOSIS — M545 Low back pain: Secondary | ICD-10-CM | POA: Diagnosis not present

## 2018-02-23 DIAGNOSIS — M48061 Spinal stenosis, lumbar region without neurogenic claudication: Secondary | ICD-10-CM | POA: Diagnosis not present

## 2018-02-27 ENCOUNTER — Other Ambulatory Visit: Payer: Self-pay

## 2018-02-27 ENCOUNTER — Ambulatory Visit: Payer: Medicare Other | Attending: Nurse Practitioner | Admitting: Nurse Practitioner

## 2018-02-27 ENCOUNTER — Encounter: Payer: Self-pay | Admitting: Nurse Practitioner

## 2018-02-27 DIAGNOSIS — M79604 Pain in right leg: Secondary | ICD-10-CM | POA: Diagnosis not present

## 2018-02-27 DIAGNOSIS — G4733 Obstructive sleep apnea (adult) (pediatric): Secondary | ICD-10-CM | POA: Insufficient documentation

## 2018-02-27 DIAGNOSIS — F419 Anxiety disorder, unspecified: Secondary | ICD-10-CM | POA: Insufficient documentation

## 2018-02-27 DIAGNOSIS — M7730 Calcaneal spur, unspecified foot: Secondary | ICD-10-CM | POA: Diagnosis not present

## 2018-02-27 DIAGNOSIS — E782 Mixed hyperlipidemia: Secondary | ICD-10-CM | POA: Diagnosis not present

## 2018-02-27 DIAGNOSIS — I481 Persistent atrial fibrillation: Secondary | ICD-10-CM | POA: Diagnosis not present

## 2018-02-27 DIAGNOSIS — K219 Gastro-esophageal reflux disease without esophagitis: Secondary | ICD-10-CM | POA: Insufficient documentation

## 2018-02-27 DIAGNOSIS — G894 Chronic pain syndrome: Secondary | ICD-10-CM

## 2018-02-27 DIAGNOSIS — I10 Essential (primary) hypertension: Secondary | ICD-10-CM | POA: Diagnosis not present

## 2018-02-27 DIAGNOSIS — M5441 Lumbago with sciatica, right side: Secondary | ICD-10-CM

## 2018-02-27 DIAGNOSIS — M25561 Pain in right knee: Secondary | ICD-10-CM

## 2018-02-27 DIAGNOSIS — K760 Fatty (change of) liver, not elsewhere classified: Secondary | ICD-10-CM | POA: Diagnosis not present

## 2018-02-27 DIAGNOSIS — G8929 Other chronic pain: Secondary | ICD-10-CM | POA: Diagnosis present

## 2018-02-27 DIAGNOSIS — G2581 Restless legs syndrome: Secondary | ICD-10-CM | POA: Diagnosis not present

## 2018-02-27 DIAGNOSIS — I4891 Unspecified atrial fibrillation: Secondary | ICD-10-CM | POA: Diagnosis not present

## 2018-02-27 DIAGNOSIS — Z7901 Long term (current) use of anticoagulants: Secondary | ICD-10-CM | POA: Insufficient documentation

## 2018-02-27 DIAGNOSIS — M25562 Pain in left knee: Secondary | ICD-10-CM | POA: Diagnosis not present

## 2018-02-27 DIAGNOSIS — M7022 Olecranon bursitis, left elbow: Secondary | ICD-10-CM | POA: Insufficient documentation

## 2018-02-27 DIAGNOSIS — Z6841 Body Mass Index (BMI) 40.0 and over, adult: Secondary | ICD-10-CM | POA: Insufficient documentation

## 2018-02-27 DIAGNOSIS — R1031 Right lower quadrant pain: Secondary | ICD-10-CM | POA: Diagnosis not present

## 2018-02-27 DIAGNOSIS — Z79899 Other long term (current) drug therapy: Secondary | ICD-10-CM

## 2018-02-27 DIAGNOSIS — F329 Major depressive disorder, single episode, unspecified: Secondary | ICD-10-CM | POA: Diagnosis not present

## 2018-02-27 DIAGNOSIS — G43109 Migraine with aura, not intractable, without status migrainosus: Secondary | ICD-10-CM | POA: Insufficient documentation

## 2018-02-27 DIAGNOSIS — I700 Atherosclerosis of aorta: Secondary | ICD-10-CM | POA: Insufficient documentation

## 2018-02-27 DIAGNOSIS — E876 Hypokalemia: Secondary | ICD-10-CM | POA: Diagnosis not present

## 2018-02-27 DIAGNOSIS — M25551 Pain in right hip: Secondary | ICD-10-CM | POA: Diagnosis not present

## 2018-02-27 DIAGNOSIS — Z789 Other specified health status: Secondary | ICD-10-CM

## 2018-02-27 DIAGNOSIS — M899 Disorder of bone, unspecified: Secondary | ICD-10-CM

## 2018-02-27 NOTE — Progress Notes (Signed)
Safety precautions to be maintained throughout the outpatient stay will include: orient to surroundings, keep bed in low position, maintain call bell within reach at all times, provide assistance with transfer out of bed and ambulation.  

## 2018-02-27 NOTE — Progress Notes (Signed)
Patient's Name: Amber Barnes  MRN: [id number]  Referring Provider: Harvest Dark, FNP  DOB: 11-19-1950  PCP: [name], FNP  DOS: 02/27/2018  Note by: Dionisio David NP  Service setting: Ambulatory outpatient  Specialty: Interventional Pain Management  Location: [facility] (AMB) Pain Management Facility    Patient type: New Patient    Primary Reason(s) for Visit: Initial Patient Evaluation CC: Groin Pain (right); Back Pain (lower, left); and Knee Pain (bilaterally)  HPI  Amber Barnes is a 67 y.o. year old, female patient, who comes today for an initial evaluation. She has Obesity, Class III, BMI 40-49.9 (morbid obesity) ([place]); Heel spur; Tendonitis; SOB (shortness of breath); Morbid obesity ([name]); Mixed hyperlipidemia; Essential hypertension; Anxiety and depression; Arthritis, senescent; Weight loss due to medication; GERD (gastroesophageal reflux disease); Seasonal rhinitis; Routine physical examination; Dizziness; Family history of ovarian cancer; Fatty liver; New onset a-fib ([name]); Hypokalemia; Left foot pain; Atypical chest pain; Persistent atrial fibrillation ([name]); Acute pulmonary edema ([name]); Acute pain of left shoulder; OSA (obstructive sleep apnea); Restless leg; Bradycardia; Depression, recurrent ([name]); Migraine with aura and without status migrainosus, not intractable; Atherosclerosis of aorta ([name]); Olecranon bursitis of left elbow; Cervical radiculopathy; Chronic right-sided low back pain with bilateral sciatica; Screening mammogram, encounter for; Numbness and tingling; Chronic groin pain, right (Primary Area of Pain); Chronic pain of right lower extremity (Secondary Area of Pain); Chronic hip pain, right [name] Area of Pain); Chronic bilateral low back pain with right-sided sciatica (Fourth Area of Pain); Chronic pain of both knees; Chronic pain syndrome; Pharmacologic therapy; Disorder of skeletal system; and Problems influencing health status on their problem list.. Her primarily  concern today is the Groin Pain (right); Back Pain (lower, left); and Knee Pain (bilaterally)  Pain Assessment: Location: Right Groin Radiating: denies Onset: More than a month ago Duration: Chronic pain Quality: Shooting, Sharp Severity: 10-Worst pain ever/10 (subjective, self-reported pain score)  Note: Reported level is compatible with observation. Clinically the patient looks like a 2/10 A 2/10 is viewed as "Mild to Moderate" and described as noticeable and distracting. Impossible to hide from other people. More frequent flare-ups. Still possible to adapt and function close to normal. It can be very annoying and may have occasional stronger flare-ups. With discipline, patients may get used to it and adapt. Information on the proper use of the pain scale provided to the patient today. When using our objective Pain Scale, levels between 6 and 10/10 are said to belong in an emergency room, as it progressively worsens from a 6/10, described as severely limiting, requiring emergency care not usually available at an outpatient pain management facility. At a 6/10 level, communication becomes difficult and requires great effort. Assistance to reach the emergency department may be required. Facial flushing and profuse sweating along with potentially dangerous increases in heart rate and blood pressure will be evident. Effect on ADL: difficult to get up and move around Timing: Intermittent Modifying factors: injections  BP: (!) 161/80  HR: (!) 56  Onset and Duration: Present longer than 3 months Cause of pain: Unknown Severity: Getting worse, NAS-11 at its worse: 10/10, NAS-11 at its best: 8/10, NAS-11 now: 10/10 and NAS-11 on the average: 8/10 Timing: Not influenced by the time of the day Aggravating Factors: Bending, Climbing, Intercourse (sex), Lifiting, Motion, Prolonged sitting, Prolonged standing, Squatting, Twisting, Walking, Walking uphill and Walking downhill Alleviating Factors: walking in  water Associated Problems: Depression, Swelling, Tingling, Pain that wakes patient up and Pain that does not allow patient to sleep  Quality of Pain: Deep, Disabling, Horrible, Pulsating, Sharp, Shooting, Stabbing, Throbbing and Tingling Previous Examinations or Tests: Bone scan, CT scan, MRI scan, X-rays, Nerve conduction test and Neurological evaluation Previous Treatments: The patient denies NONE NOTED  The patient comes into the clinics today for the first time for a chronic pain management evaluation. According to the patient her primary area of pain is in her right groin. He describes a sharp constant pain that gets worse with walking. She admits that she has been having this pain for a while however it has gotten worse over the last 3-4 months. She admits that 4  years ago she had some type of injection which was effective for her pain. She denies any previous physical therapy. She hashad recent images.  Her second area of pain is in her right leg. She admits that she has numbness and tingling that goes all the way down the side and front of her leg into the top of her foot. She admits that she does have some weakness. She denies nerve conduction study.  Her fourth area of pain is in her hip. She admits that she has difficulty getting into bed at night. Again she has had some type of injection 4 years ago which was effective for the pain. She denies any surgery or physical therapy. She has had recent images.  Her next area pain is in her lower back.  She admits that this is midline.  She meets that she has had previous surgery however not aware which type of surgery.  She admits that was done by a doctor out of [place].  She denies any previous interventional therapy or recent physical therapy.  She did have an MRI.  Today I took the time to provide the patient with information regarding this pain practice. The patient was informed that the practice is divided into two sections: an interventional  pain management section, as well as a completely separate and distinct medication management section. I explained that there are procedure days for interventional therapies, and evaluation days for follow-ups and medication management. Because of the amount of documentation required during both, they are kept separated. This means that there is the possibility that she may be scheduled for a procedure on one day, and medication management the next. I have also informed her that because of staffing and facility limitations, this practice will no longer take patients for medication management only. To illustrate the reasons for this, I gave the patient the example of surgeons, and how inappropriate it would be to refer a patient to his/her care, just to write for the post-surgical antibiotics on a surgery done by a different surgeon.   Because interventional pain management is part of the board-certified specialty for the doctors, the patient was informed that joining this practice means that they are open to any and all interventional therapies. I made it clear that this does not mean that they will be forced to have any procedures done. What this means is that I believe interventional therapies to be essential part of the diagnosis and proper management of chronic pain conditions. Therefore, patients not interested in these interventional alternatives will be better served under the care of a different practitioner.  The patient was also made aware of my Comprehensive Pain Management Safety Guidelines where by joining this practice, they limit all of their nerve blocks and joint injections to those done by our practice, for as long as we are retained to manage their care. Historic  Controlled Substance Pharmacotherapy Review  PMP and historical list of controlled substances: Tramadol 50 mg, hydrocodone/homo-atropine syrup, alprazolam 0.25 g, hydromorphone 2 mg, lorazepam 1 mg, oxycodone/acetaminophen 10/325 mg,  acetaminophen with codeine No. 3, Belviq XR 20 mg, hydrocodone/acetaminophen 5/325 mg, oxycodone 5 mg, diazepam 5 mg, Belviq 10 mg, Highest opioid analgesic regimen found: Oxycodone 5 mg 1 to 2 tablets every 4 hours (fill date 01/18/2017) oxycodone 60 mg/day Most recent opioid analgesic: Tramadol 50 mg 1 tablet 3 times daily (fill date 02/01/2018) tramadol 150 mg/day Current opioid analgesics: None Highest recorded MME/day: 90 mg/day MME/day: 0 mg/day Medications: The patient did not bring the medication(s) to the appointment, as requested in our "New Patient Package" Pharmacodynamics: Desired effects: Analgesia: The patient reports >50% benefit. Reported improvement in function: The patient reports medication allows her to accomplish basic ADLs. Clinically meaningful improvement in function (CMIF): Sustained CMIF goals met Perceived effectiveness: Described as relatively effective, allowing for increase in activities of daily living (ADL) Undesirable effects: Side-effects or Adverse reactions: None reported Historical Monitoring: The patient  reports that she does not use drugs. List of all UDS Test(s): No results found for: MDMA, COCAINSCRNUR, PCPSCRNUR, PCPQUANT, CANNABQUANT, THCU, [place] List of all Serum Drug Screening Test(s):  No results found for: AMPHSCRSER, BARBSCRSER, BENZOSCRSER, COCAINSCRSER, PCPSCRSER, PCPQUANT, THCSCRSER, CANNABQUANT, OPIATESCRSER, OXYSCRSER, PROPOXSCRSER Historical Background Evaluation: [place] PDMP: Six (6) year initial data search conducted.             [place] Department of public safety, offender search: Editor, commissioning Information) Non-contributory Risk Assessment Profile: Aberrant behavior: None observed or detected today Risk factors for fatal opioid overdose: Benzodiazepine use, caucasian and CBD oil Fatal overdose hazard ratio (HR): Calculation deferred Non-fatal overdose hazard ratio (HR): Calculation deferred Risk of opioid abuse or dependence: 0.7-3.0% with doses ?  36 MME/day and 6.1-26% with doses ? 120 MME/day. Substance use disorder (SUD) risk level: Pending results of Medical Psychology Evaluation for SUD Opioid risk tool (ORT) (Total Score): 1  ORT Scoring interpretation table:  Score <3 = Low Risk for SUD  Score between 4-7 = Moderate Risk for SUD  Score >8 = High Risk for Opioid Abuse   PHQ-2 Depression Scale:  Total score: 0  PHQ-2 Scoring interpretation table: (Score and probability of major depressive disorder)  Score 0 = No depression  Score 1 = 15.4% Probability  Score 2 = 21.1% Probability  Score 3 = 38.4% Probability  Score 4 = 45.5% Probability  Score 5 = 56.4% Probability  Score 6 = 78.6% Probability   PHQ-9 Depression Scale:  Total score: 0  PHQ-9 Scoring interpretation table:  Score 0-4 = No depression  Score 5-9 = Mild depression  Score 10-14 = Moderate depression  Score 15-19 = Moderately severe depression  Score 20-27 = Severe depression (2.4 times higher risk of SUD and 2.89 times higher risk of overuse)   Pharmacologic Plan: Pending ordered tests and/or consults  Meds  The patient has a current medication list which includes the following prescription(s): amiodarone, b complex vitamins, bisoprolol, calcium gluconate, vitamin d3, eliquis, esomeprazole, fluvoxamine, furosemide, gabapentin, hydroxyzine, losartan, multivitamin, oxymetazoline, potassium chloride, ranolazine, rizatriptan, rosuvastatin, and trazodone.  Current Outpatient Medications on File Prior to Visit  Medication Sig  . amiodarone (PACERONE) 200 MG tablet Take 1 tablet (200 mg total) by mouth daily.  . B Complex Vitamins (VITAMIN B COMPLEX PO) Take 1 tablet by mouth daily.   . bisoprolol (ZEBETA) 5 MG tablet Take 1 tablet (5 mg total) by mouth daily.  Marland Kitchen  calcium gluconate 500 MG tablet Take 500 tablets by mouth daily.  . Cholecalciferol (VITAMIN D3) 5000 units CAPS Take 5,000 Units by mouth daily.  Marland Kitchen ELIQUIS 5 MG TABS tablet TAKE 1 TABLET BY MOUTH  TWICE A DAY  . esomeprazole (NEXIUM) 20 MG capsule Take 20 mg by mouth daily as needed (acid reflux).  . fluvoxaMINE (LUVOX) 50 MG tablet Take 2.5 tablets (125 mg total) by mouth at bedtime.  . furosemide (LASIX) 20 MG tablet TAKE 1 TABLET (20 MG TOTAL) BY MOUTH 2 (TWO) TIMES DAILY.  Marland Kitchen gabapentin (NEURONTIN) 400 MG capsule Take 1 capsule (400 mg total) by mouth 2 (two) times daily.  . hydrOXYzine (ATARAX/VISTARIL) 10 MG tablet Take 2 tablets (20 mg total) by mouth 2 (two) times daily as needed.  Marland Kitchen losartan (COZAAR) 25 MG tablet TAKE 1 TABLET BY MOUTH EVERY DAY  . Multiple Vitamin (MULTIVITAMIN) tablet Take 1 tablet by mouth daily.  Marland Kitchen oxymetazoline (AFRIN) 0.05 % nasal spray Place 1 spray into both nostrils at bedtime as needed for congestion.   . potassium chloride (KLOR-CON 10) 10 MEQ tablet Take 1 tablet (10 mEq total) by mouth 2 (two) times daily.  . ranolazine (RANEXA) 500 MG 12 hr tablet TAKE 1 TABLET BY MOUTH TWICE A DAY  . rizatriptan (MAXALT) 10 MG tablet TAKE 1 TABLET (10 MG TOTAL) BY MOUTH AS NEEDED FOR MIGRAINE. MAY REPEAT IN 2 HOURS IF NEEDED  . rosuvastatin (CRESTOR) 20 MG tablet TAKE 1 TABLET BY MOUTH EVERY DAY  . traZODone (DESYREL) 50 MG tablet Take 0.5-1 tablets (25-50 mg total) by mouth at bedtime. sleep   No current facility-administered medications on file prior to visit.    Imaging Review  Cervical Imaging:  Results for orders placed during the hospital encounter of 04/30/17  CT Cervical Spine Wo Contrast   Narrative CLINICAL DATA:  Patient fell in bathtub and hit head. Anticoagulation with Eliquis. Complains of dizziness, headache, neck pain, and chest pain.  EXAM: CT HEAD WITHOUT CONTRAST  CT CERVICAL SPINE WITHOUT CONTRAST  TECHNIQUE: Multidetector CT imaging of the head and cervical spine was performed following the standard protocol without intravenous contrast. Multiplanar CT image reconstructions of the cervical spine were also generated.  COMPARISON:   02/06/2011.  FINDINGS: CT HEAD FINDINGS  Brain: No evidence for acute infarction, hemorrhage, mass lesion, hydrocephalus, or extra-axial fluid. Normal cerebral volume. No white matter disease.  Vascular: Calcification of the cavernous internal carotid arteries consistent with cerebrovascular atherosclerotic disease. No signs of intracranial large vessel occlusion.  Skull: Calvarium intact. No focal lesion. RIGHT-sided scalp hematoma without laceration.  Sinuses/Orbits: Negative.  Other: None.  CT CERVICAL SPINE FINDINGS  Alignment: Reversal of the normal cervical lordotic curve. No visible traumatic subluxation.  Skull base and vertebrae: Image quality is reduced due to the patient's body habitus (290 pounds). Within limits for detection due to beam hardening, no visible fracture.  Soft tissues and spinal canal: Reduced image quality. Multilevel spondylosis. No definite spinal hematoma.  Disc levels: Spinal stenosis due to osseous spurring most likely C5-C6.  Upper chest: No visible pneumothorax or upper rib fracture.  Other: None.  IMPRESSION: No skull fracture or intracranial hemorrhage.  Reduced image quality in the cervical spine due to body habitus. No visible cervical spine fracture, traumatic subluxation, or intraspinal hematoma.  Multilevel spondylosis with reversal of the normal cervical lordotic curve, worst at C5-6.   Electronically Signed   By: Staci Righter M.D.   On: 04/30/2017 11:52   Lumbosacral  Imaging: Lumbar MR wo contrast:  Results for orders placed during the hospital encounter of 02/23/18  MR LUMBAR SPINE WO CONTRAST   Narrative CLINICAL DATA:  Low back and right hip and leg pain for 3 months. Numbness and tingling in the right leg.  EXAM: MRI LUMBAR SPINE WITHOUT CONTRAST  TECHNIQUE: Multiplanar, multisequence MR imaging of the lumbar spine was performed. No intravenous contrast was administered.  COMPARISON:  MRI lumbar spine  11/15/2014.  FINDINGS: Segmentation:  Standard.  Alignment: Trace degenerative retrolisthesis L2 on L3 and anterolisthesis L4 on L5 are identified.  Vertebrae:No fracture or worrisome lesion. There is mild degenerative endplate signal change at L2-3.  Conus medullaris and cauda equina: Conus extends to the L1-2 level. Conus and cauda equina appear normal.  Paraspinal and other soft tissues: Small T2 hyperintense lesions in the kidneys are most compatible with cysts.  Disc levels:  T10-11 is imaged in the sagittal plane only. There is a tiny right paracentral protrusion without stenosis.  T11-12: Negative.  T12-L1: Negative.  L1-2: Small central protrusion seen on the prior examination has regressed. There is a minimal bulge but the central canal and foramina are open.  L2-3: Minimal disc bulge.  No stenosis.  L3-4: Moderate facet arthropathy. Minimal disc bulge. There is mild central canal stenosis and minimal left subarticular recess narrowing. The right foramen is widely patent.  L4-5: Moderate to advanced facet degenerative change is worse on the left. There is slight disc uncovering and bulging but the central spinal canal and foramina appear open.  L5-S1: There is loss of disc space height with a shallow bulge and endplate spur. No stenosis.  IMPRESSION: Scattered facet degenerative disease appears worst at L4-5 on the left. Mild central canal, left subarticular recess and foraminal narrowing are seen at L4-5. No nerve root compression is identified.  Mild central canal and minimal left subarticular recess narrowing L3-4. No nerve root compression.   Electronically Signed   By: Inge Rise M.D.   On: 02/23/2018 16:40   Knee Imaging:  Knee-L MR w contrast:  Results for orders placed during the hospital encounter of 05/17/17  MR KNEE LEFT WO CONTRAST   Narrative CLINICAL DATA:  Anterior left knee pain and swelling for 2 months. No known  injury.  EXAM: MRI OF THE LEFT KNEE WITHOUT CONTRAST  TECHNIQUE: Multiplanar, multisequence MR imaging of the knee was performed. No intravenous contrast was administered.  COMPARISON:  Plain films left knee 04/13/2017. Report of MRI left knee 02/07/2013 reviewed. Images are not available.  FINDINGS: MENISCI  Medial meniscus: The patient has a very large radial tear of the posterior horn of the medial meniscus. The tear is at the root and there is a gap between the root and remnant of the posterior horn of 1.6 cm identified. The body is somewhat extruded peripherally with fraying along its free edge. A large radial tear is described on the report of the prior MRI.  Lateral meniscus:  Intact.  LIGAMENTS  Cruciates:  Intact.  Collaterals:  Intact.  CARTILAGE  Patellofemoral:  Extensive cartilage loss is present diffusely.  Medial: Markedly thinned throughout with associated joint space narrowing.  Lateral:  Moderately degenerated.  Joint:  Small effusion.  Popliteal Fossa: Baker's cyst measuring 3.3 cm transverse by 1 cm AP by 5.8 cm craniocaudal is identified.  Extensor Mechanism:  Intact.  Bones: Tricompartmental osteophytosis is present. Subchondral edema is seen about the periphery of the medial compartment and posterior aspect of the medial femoral condyle.  Marked subchondral edema is present in the lateral patellar facet. There is also some subchondral edema at the apex of the patella in the superior pole. No fracture.  Other: None.  IMPRESSION: Very large radial tear through the root of the posterior horn of the medial meniscus with a gap of 1.6 cm between the posterior horn and meniscal root identified. Large radial tear is described on report of comparison MRI but the images are not available for direct comparison.  Advanced osteoarthritis is worst in the medial and patellofemoral compartments.  Baker's cyst.   Electronically Signed   By:  Inge Rise M.D.   On: 05/17/2017 14:58    Results for orders placed during the hospital encounter of 04/13/17  DG Knee 3 Views Right   Narrative CLINICAL DATA:  Bilateral knee pain.  EXAM: BILATERAL KNEES STANDING - 1 VIEW; LEFT KNEE - 3 VIEW; RIGHT KNEE - 3 VIEW  COMPARISON:  None.  FINDINGS: Moderate to advanced tricompartmental degenerative changes most significant in the medial compartments and more significant involving the left knee. There is joint space narrowing and osteophytic spurring. No fracture or osteochondral lesion. No findings for chondrocalcinosis. Small joint effusion on the left and moderate joint effusion of the right.  IMPRESSION: Tricompartmental degenerative changes most significantly involving the medial compartments and greater on the left on the right.  No acute bony findings.  Bilateral joint effusions.   Electronically Signed   By: Marijo Sanes M.D.   On: 04/13/2017 15:04    Knee-L DG 3 views:  Results for orders placed during the hospital encounter of 04/13/17  DG Knee 3 Views Left   Narrative CLINICAL DATA:  Bilateral knee pain.  EXAM: BILATERAL KNEES STANDING - 1 VIEW; LEFT KNEE - 3 VIEW; RIGHT KNEE - 3 VIEW  COMPARISON:  None.  FINDINGS: Moderate to advanced tricompartmental degenerative changes most significant in the medial compartments and more significant involving the left knee. There is joint space narrowing and osteophytic spurring. No fracture or osteochondral lesion. No findings for chondrocalcinosis. Small joint effusion on the left and moderate joint effusion of the right.  IMPRESSION: Tricompartmental degenerative changes most significantly involving the medial compartments and greater on the left on the right.  No acute bony findings.  Bilateral joint effusions.   Electronically Signed   By: Marijo Sanes M.D.   On: 04/13/2017 15:04   Note: Available results from prior imaging studies were  reviewed.        ROS  Cardiovascular History: Heart trouble, Abnormal heart rhythm, High blood pressure, Weak heart (CHF) and Blood thinners:  Anticoagulant Pulmonary or Respiratory History: Shortness of breath Neurological History: No reported neurological signs or symptoms such as seizures, abnormal skin sensations, urinary and/or fecal incontinence, being born with an abnormal open spine and/or a tethered spinal cord Review of Past Neurological Studies:  Results for orders placed or performed during the hospital encounter of 09/28/17  CT Head Wo Contrast   Narrative   CLINICAL DATA:  Trauma. High blood pressure. Hand mechanical fall. Slipped on concrete  EXAM: CT HEAD WITHOUT CONTRAST  TECHNIQUE: Contiguous axial images were obtained from the base of the skull through the vertex without intravenous contrast.  COMPARISON:  None.  FINDINGS: Brain: No acute intracranial hemorrhage. No focal mass lesion. No CT evidence of acute infarction. No midline shift or mass effect. No hydrocephalus. Basilar cisterns are patent.  Vascular: No hyperdense vessel or unexpected calcification.  Skull: Normal. Negative for fracture or focal lesion.  Sinuses/Orbits: Paranasal sinuses and mastoid air cells are clear. Orbits are clear.  Other: None.  IMPRESSION: 1. No intracranial trauma. 2. Normal head CT for age.   Electronically Signed   By: Suzy Bouchard M.D.   On: 09/28/2017 10:03    Psychological-Psychiatric History: Anxiousness, Depressed and Prone to panicking Gastrointestinal History: Reflux or heatburn Genitourinary History: No reported renal or genitourinary signs or symptoms such as difficulty voiding or producing urine, peeing blood, non-functioning kidney, kidney stones, difficulty emptying the bladder, difficulty controlling the flow of urine, or chronic kidney disease Hematological History: Brusing easily Endocrine History: No reported endocrine signs or symptoms such  as high or low blood sugar, rapid heart rate due to high thyroid levels, obesity or weight gain due to slow thyroid or thyroid disease Rheumatologic History: Joint aches and or swelling due to excess weight (Osteoarthritis) Musculoskeletal History: Negative for myasthenia gravis, muscular dystrophy, multiple sclerosis or malignant hyperthermia Work History: Retired  Allergies  Ms. Moshier is allergic to prednisone; hydrocodone-acetaminophen; oxycodone; adhesive [tape]; and latex.  Laboratory Chemistry  Inflammation Markers No results found for: CRP, ESRSEDRATE (CRP: Acute Phase) (ESR: Chronic Phase) Renal Function Markers Lab Results  Component Value Date   BUN 18 09/28/2017   CREATININE 0.95 09/28/2017   GFRAA >60 09/28/2017   GFRNONAA >60 09/28/2017   Hepatic Function Markers Lab Results  Component Value Date   AST 17 04/13/2017   ALT 23 04/13/2017   ALBUMIN 4.0 04/13/2017   ALKPHOS 81 04/13/2017   HCVAB NEGATIVE 05/12/2016   Electrolytes Lab Results  Component Value Date   NA 141 09/28/2017   K 4.2 09/28/2017   CL 103 09/28/2017   CALCIUM 8.9 09/28/2017   MG 2.1 11/29/2016   Neuropathy Markers No results found for: [id number] Bone Pathology Markers Lab Results  Component Value Date   ALKPHOS 81 04/13/2017   VD25OH 57.98 05/12/2016   CALCIUM 8.9 09/28/2017   Coagulation Parameters Lab Results  Component Value Date   INR 1.05 04/30/2017   LABPROT 13.6 04/30/2017   PLT 166 09/28/2017   Cardiovascular Markers Lab Results  Component Value Date   BNP 278.0 (H) 08/10/2016   HGB 13.6 09/28/2017   HCT 41.4 09/28/2017   Note: Lab results reviewed.  [place]  Drug: Ms. Pepitone  reports that she does not use drugs. Alcohol:  reports that she drank alcohol. Tobacco:  reports that she quit smoking about 14 years ago. Her smoking use included cigarettes. She has a 2.50 pack-year smoking history. She quit smokeless tobacco use about 16 years ago. Medical:  has a past  medical history of Achilles tendinitis, Acute medial meniscal tear, Allergy, Anxiety, Arthritis, Atrial fibrillation (HCC), CHF (congestive heart failure) (HCC), Chronic lower back pain, COPD (chronic obstructive pulmonary disease) ([place]), Depression, Fibrocystic breast disease, GERD (gastroesophageal reflux disease), Hyperlipidemia, Hypertension, Insomnia, Lipoma, Migraine, Osteoarthritis, Osteopenia, Tendonitis, Thyroid disease, and Vitamin D deficiency. Family: family history includes Breast cancer in her paternal aunt; Cancer in her mother; Cancer (age of onset: 94) in her sister; Cancer (age of onset: 77) in her father; Depression in her sister; Heart attack (age of onset: 12) in her father; Heart disease in her father; Hyperlipidemia in her father; Hypertension in her father.  Past Surgical History:  Procedure Laterality Date  . ABDOMINAL HYSTERECTOMY     ovaries left  . ACHILLES TENDON SURGERY     2012,01/2017  . BACK SURGERY    . CARDIOVERSION N/A 09/07/2016   Procedure: CARDIOVERSION;  Surgeon: Minna Merritts,  MD;  Location: [facility] OR[facility];  Service: Cardiovascular;  Laterality: N/A;  . CARDIOVERSION N/A 10/19/2016   Procedure: CARDIOVERSION;  Surgeon: Minna Merritts, MD;  Location: [facility];  Service: Cardiovascular;  Laterality: N/A;  . CARDIOVERSION N/A 12/14/2016   Procedure: CARDIOVERSION;  Surgeon: Minna Merritts, MD;  Location: [facility] OR[facility];  Service: Cardiovascular;  Laterality: N/A;  . COLONOSCOPY    . ESOPHAGOGASTRODUODENOSCOPY    . THYROIDECTOMY     thyroid lobectomy secondary to a benign nodule   Active Ambulatory Problems    Diagnosis Date Noted  . Obesity, Class III, BMI 40-49.9 (morbid obesity) ([place]) 04/11/2013  . SOB (shortness of breath) 11/11/2014  . Morbid obesity ([place]) 11/11/2014  . Mixed hyperlipidemia 11/11/2014  . Essential hypertension 11/11/2014  . Anxiety and depression 11/11/2014  . Arthritis, senescent 11/11/2014  . Weight loss due to medication 11/11/2014   . Heel spur 11/05/2015  . Tendonitis 11/05/2015  . GERD (gastroesophageal reflux disease) 05/05/2016  . Seasonal rhinitis 05/05/2016  . Routine physical examination 05/05/2016  . Dizziness 06/08/2016  . Family history of ovarian cancer 06/08/2016  . Fatty liver 06/30/2016  . New onset a-fib ([place]) 07/28/2016  . Hypokalemia 07/28/2016  . Left foot pain 07/28/2016  . Atypical chest pain   . Persistent atrial fibrillation ([place]) 08/11/2016  . Acute pulmonary edema (HCC)   . Acute pain of left shoulder 11/28/2016  . OSA (obstructive sleep apnea) 01/09/2017  . Restless leg 04/13/2017  . Bradycardia 05/22/2017  . Depression, recurrent ([place]) 05/22/2017  . Migraine with aura and without status migrainosus, not intractable 06/27/2017  . Atherosclerosis of aorta ([place]) 08/25/2017  . Olecranon bursitis of left elbow 11/14/2017  . Cervical radiculopathy 11/14/2017  . Chronic right-sided low back pain with bilateral sciatica 02/02/2018  . Screening mammogram, encounter for 02/02/2018  . Numbness and tingling 12/08/2017  . Chronic groin pain, right (Primary Area of Pain) 02/27/2018  . Chronic pain of right lower extremity (Secondary Area of Pain) 02/27/2018  . Chronic hip pain, right [facility] Area of Pain) 02/27/2018  . Chronic bilateral low back pain with right-sided sciatica (Fourth Area of Pain) 02/27/2018  . Chronic pain of both knees 02/27/2018  . Chronic pain syndrome 02/27/2018  . Pharmacologic therapy 02/27/2018  . Disorder of skeletal system 02/27/2018  . Problems influencing health status 02/27/2018   Resolved Ambulatory Problems    Diagnosis Date Noted  . Atrial fibrillation with RVR ([place]) 07/27/2016  . A-fib ([place]) 11/28/2016   Past Medical History:  Diagnosis Date  . Achilles tendinitis   . Acute medial meniscal tear   . Allergy   . Anxiety   . Arthritis   . Atrial fibrillation ([place])   . CHF (congestive heart failure) ([place])   . Chronic lower back pain   . COPD  (chronic obstructive pulmonary disease) ([place])   . Depression   . Fibrocystic breast disease   . Hyperlipidemia   . Hypertension   . Insomnia   . Lipoma   . Migraine   . Osteoarthritis   . Osteopenia   . Thyroid disease   . Vitamin D deficiency    Constitutional Exam  General appearance: Well nourished, well developed, and well hydrated. In no apparent acute distress Vitals:   02/27/18 1312  BP: (!) 161/80  Pulse: (!) 56  Resp: 16  Temp: 98 F (36.7 C)  TempSrc: Oral  SpO2: 95%  Weight: (!) 324 lb (147 kg)  Height: 5' 5"  (1.651 m)   BMI Assessment:  Estimated body mass index is 53.92 kg/m as calculated from the following:   Height as of this encounter: 5' 5"  (1.651 m).   Weight as of this encounter: 324 lb (147 kg).  BMI interpretation table: BMI level Category Range association with higher incidence of chronic pain  <18 kg/m2 Underweight   18.5-24.9 kg/m2 Ideal body weight   25-29.9 kg/m2 Overweight Increased incidence by 20%  30-34.9 kg/m2 Obese (Class I) Increased incidence by 68%  35-39.9 kg/m2 Severe obesity (Class II) Increased incidence by 136%  >40 kg/m2 Extreme obesity (Class III) Increased incidence by 254%   BMI Readings from Last 4 Encounters:  02/27/18 53.92 kg/m  02/01/18 54.55 kg/m  11/21/17 55.75 kg/m  11/14/17 54.80 kg/m   Wt Readings from Last 4 Encounters:  02/27/18 (!) 324 lb (147 kg)  02/01/18 (!) 327 lb 12.8 oz (148.7 kg)  11/21/17 (!) 335 lb (152 kg)  11/14/17 (!) 329 lb 4.8 oz (149.4 kg)  Psych/Mental status: Alert, oriented x 3 (person, place, & time)       Eyes: PERLA Respiratory: No evidence of acute respiratory distress  Lumbar Spine Exam  Inspection: No masses, redness, or swelling Alignment: Symmetrical Functional ROM: Guarding      Stability: No instability detected Muscle strength & Tone: Functionally intact Sensory: Unimpaired Palpation: Complains of area being tender to palpation       Provocative Tests: Lumbar  Hyperextension and rotation test: Positive on the right for facet joint pain.leg raisespositive less than 45 on the right Patrick's Maneuver: Positive             for right hip arthralgia  Gait & Posture Assessment  Ambulation: Unassisted Gait: Antalgic gait (limping) Posture: Antalgic   Lower Extremity Exam    Side: Right lower extremity  Side: Left lower extremity  Inspection: No masses, redness, swelling, or asymmetry. No contractures  Inspection: No masses, redness, swelling, or asymmetry. No contractures  Functional ROM: Pain restricted ROM for hip joint  Functional ROM: Adequate ROM for hip and knee joints  Muscle strength & Tone: Movement possible against some resistance (4/5)  Muscle strength & Tone: Movement possible against some resistance (4/5)  Sensory: Unimpaired  Sensory: Unimpaired  Palpation: Uncomfortable  Palpation: Uncomfortable   Assessment  Primary Diagnosis & Pertinent Problem List: Diagnoses of Chronic groin pain, right (Primary Area of Pain), Chronic pain of right lower extremity (Secondary Area of Pain), Chronic hip pain, right (Tertiary Area of Pain), Chronic bilateral low back pain with right-sided sciatica (Fourth Area of Pain), Chronic pain of both knees, Chronic pain syndrome, Pharmacologic therapy, Disorder of skeletal system, and Problems influencing health status were pertinent to this visit.  Visit Diagnosis: 1. Chronic groin pain, right (Primary Area of Pain)   2. Chronic pain of right lower extremity (Secondary Area of Pain)   3. Chronic hip pain, right [facility] Area of Pain)   4. Chronic bilateral low back pain with right-sided sciatica (Fourth Area of Pain)   5. Chronic pain of both knees   6. Chronic pain syndrome   7. Pharmacologic therapy   8. Disorder of skeletal system   9. Problems influencing health status    Plan of Care  Initial treatment plan:  Please be advised that as per protocol, today's visit has been an evaluation only. We have  not taken over the patient's controlled substance management.  Problem-specific plan: No problem-specific Assessment & Plan notes found for this encounter.  Ordered Lab-work, Procedure(s), Referral(s), & Consult(s): Orders Placed  This Encounter  Procedures  . Compliance Drug Analysis, Ur  . Comp. Metabolic Panel (12)  . Magnesium  . Vitamin B12  . Sedimentation rate  . 25-Hydroxyvitamin D Lcms D2+D3  . C-reactive protein   Pharmacotherapy: Medications ordered:  No orders of the defined types were placed in this encounter.  Medications administered during this visit: [place] Viereck had no medications administered during this visit.   Pharmacotherapy under consideration:  Opioid Analgesics: The patient was informed that there is no guarantee that she would be a candidate for opioid analgesics. The decision will be made following CDC guidelines. This decision will be based on the results of diagnostic studies, as well as Ms. Urizar's risk profile.  Membrane stabilizer: To be determined at a later time Muscle relaxant: To be determined at a later time NSAID: To be determined at a later time Other analgesic(s): To be determined at a later time   Interventional therapies under consideration: Ms. Tait was informed that there is no guarantee that she would be a candidate for interventional therapies. The decision will be based on the results of diagnostic studies, as well as Ms. Corl's risk profile.  Possible procedure(s): Diagnostic intra-articular right hip injection Diagnostic right sacroiliac joint injection possible right sacroiliac radiofrequency ablation Diagnostic right lumbar epidural steroid injection Diagnostic bilateral lumbar facet nerve block Possible bilateral lumbar facet radiofrequency ablation Diagnostic bilateral intra-articular knee injection Diagnostic bilateral Hyalgan series   Provider-requested follow-up: Return for 2nd Visit, w/ Dr. Dossie Arbour.  Future  Appointments  Date Time Provider [place]  03/08/2018 10:20 AM [facility]  03/21/2018  9:45 AM Milinda Pointer, MD [facility] None    Primary Care Physician: [name], FNP Location: [facility] Note by:  Date: 02/27/2018; Time: 2:41 PM  Pain Score Disclaimer: We use the NRS-11 scale. This is a self-reported, subjective measurement of pain severity with only modest accuracy. It is used primarily to identify changes within a particular patient. It must be understood that outpatient pain scales are significantly less accurate that those used for research, where they can be applied under ideal controlled circumstances with minimal exposure to variables. In reality, the score is likely to be a combination of pain intensity and pain affect, where pain affect describes the degree of emotional arousal or changes in action readiness caused by the sensory experience of pain. Factors such as social and work situation, setting, emotional state, anxiety levels, expectation, and prior pain experience may influence pain perception and show large inter-individual differences that may also be affected by time variables.  Patient instructions provided during this appointment: Patient Instructions   ____________________________________________________________________________________________  Appointment Policy Summary  It is our goal and responsibility to provide the medical community with assistance in the evaluation and management of patients with chronic pain. Unfortunately our resources are limited. Because we do not have an unlimited amount of time, or available appointments, we are required to closely monitor and manage their use. The following rules exist to maximize their use:  Patient's responsibilities: 1. Punctuality:  At what time should I arrive? You should be physically present in our office 30 minutes before your scheduled appointment.  Your scheduled appointment is with your assigned healthcare provider. However, it takes 5-10 minutes to be "checked-in", and another 15 minutes for the nurses to do the admission. If you arrive to our office at the time you were given for your appointment, you will end up being at least 20-25 minutes late to your  appointment with the provider. 2. Tardiness:  What happens if I arrive only a few minutes after my scheduled appointment time? You will need to reschedule your appointment. The cutoff is your appointment time. This is why it is so important that you arrive at least 30 minutes before that appointment. If you have an appointment scheduled for 10:00 AM and you arrive at 10:01, you will be required to reschedule your appointment.  3. Plan ahead:  Always assume that you will encounter traffic on your way in. Plan for it. If you are dependent on a driver, make sure they understand these rules and the need to arrive early. 4. Other appointments and responsibilities:  Avoid scheduling any other appointments before or after your pain clinic appointments.  5. Be prepared:  Write down everything that you need to discuss with your healthcare provider and give this information to the admitting nurse. Write down the medications that you will need refilled. Bring your pills and bottles (even the empty ones), to all of your appointments, except for those where a procedure is scheduled. 6. No children or pets:  Find someone to take care of them. It is not appropriate to bring them in. 7. Scheduling changes:  We request "advanced notification" of any changes or cancellations. 8. Advanced notification:  Defined as a time period of more than 24 hours prior to the originally scheduled appointment. This allows for the appointment to be offered to other patients. 9. Rescheduling:  When a visit is rescheduled, it will require the cancellation of the original appointment. For this reason they both fall within the  category of "Cancellations".  10. Cancellations:  They require advanced notification. Any cancellation less than 24 hours before the  appointment will be recorded as a "No Show". 11. No Show:  Defined as an unkept appointment where the patient failed to notify or declare to the practice their intention or inability to keep the appointment.  Corrective process for repeat offenders:  1. Tardiness: Three (3) episodes of rescheduling due to late arrivals will be recorded as one (1) "No Show". 2. Cancellation or reschedule: Three (3) cancellations or rescheduling will be recorded as one (1) "No Show". 3. "No Shows": Three (3) "No Shows" within a 12 month period will result in discharge from the practice. ____________________________________________________________________________________________  ____________________________________________________________________________________________  Pain Scale  Introduction: The pain score used by this practice is the Verbal Numerical Rating Scale (VNRS-11). This is an 11-point scale. It is for adults and children 10 years or older. There are significant differences in how the pain score is reported, used, and applied. Forget everything you learned in the past and learn this scoring system.  General Information: The scale should reflect your current level of pain. Unless you are specifically asked for the level of your worst pain, or your average pain. If you are asked for one of these two, then it should be understood that it is over the past 24 hours.  Basic Activities of Daily Living (ADL): Personal hygiene, dressing, eating, transferring, and using restroom.  Instructions: Most patients tend to report their level of pain as a combination of two factors, their physical pain and their psychosocial pain. This last one is also known as "suffering" and it is reflection of how physical pain affects you socially and psychologically. From now on, report them  separately. From this point on, when asked to report your pain level, report only your physical pain. Use the following table for reference.  Pain Clinic Pain  Levels (0-5/10)  Pain Level Score  Description  No Pain 0   Mild pain 1 Nagging, annoying, but does not interfere with basic activities of daily living (ADL). Patients are able to eat, bathe, get dressed, toileting (being able to get on and off the toilet and perform personal hygiene functions), transfer (move in and out of bed or a chair without assistance), and maintain continence (able to control bladder and bowel functions). Blood pressure and heart rate are unaffected. A normal heart rate for a healthy adult ranges from 60 to 100 bpm (beats per minute).   Mild to moderate pain 2 Noticeable and distracting. Impossible to hide from other people. More frequent flare-ups. Still possible to adapt and function close to normal. It can be very annoying and may have occasional stronger flare-ups. With discipline, patients may get used to it and adapt.   Moderate pain 3 Interferes significantly with activities of daily living (ADL). It becomes difficult to feed, bathe, get dressed, get on and off the toilet or to perform personal hygiene functions. Difficult to get in and out of bed or a chair without assistance. Very distracting. With effort, it can be ignored when deeply involved in activities.   Moderately severe pain 4 Impossible to ignore for more than a few minutes. With effort, patients may still be able to manage work or participate in some social activities. Very difficult to concentrate. Signs of autonomic nervous system discharge are evident: dilated pupils (mydriasis); mild sweating (diaphoresis); sleep interference. Heart rate becomes elevated (>115 bpm). Diastolic blood pressure (lower number) rises above 100 mmHg. Patients find relief in laying down and not moving.   Severe pain 5 Intense and extremely unpleasant. Associated with  frowning face and frequent crying. Pain overwhelms the senses.  Ability to do any activity or maintain social relationships becomes significantly limited. Conversation becomes difficult. Pacing back and forth is common, as getting into a comfortable position is nearly impossible. Pain wakes you up from deep sleep. Physical signs will be obvious: pupillary dilation; increased sweating; goosebumps; brisk reflexes; cold, clammy hands and feet; nausea, vomiting or dry heaves; loss of appetite; significant sleep disturbance with inability to fall asleep or to remain asleep. When persistent, significant weight loss is observed due to the complete loss of appetite and sleep deprivation.  Blood pressure and heart rate becomes significantly elevated. Caution: If elevated blood pressure triggers a pounding headache associated with blurred vision, then the patient should immediately seek attention at an urgent or emergency care unit, as these may be signs of an impending stroke.    Emergency Department Pain Levels (6-10/10)  Emergency Room Pain 6 Severely limiting. Requires emergency care and should not be seen or managed at an outpatient pain management facility. Communication becomes difficult and requires great effort. Assistance to reach the emergency department may be required. Facial flushing and profuse sweating along with potentially dangerous increases in heart rate and blood pressure will be evident.   Distressing pain 7 Self-care is very difficult. Assistance is required to transport, or use restroom. Assistance to reach the emergency department will be required. Tasks requiring coordination, such as bathing and getting dressed become very difficult.   Disabling pain 8 Self-care is no longer possible. At this level, pain is disabling. The individual is unable to do even the most "basic" activities such as walking, eating, bathing, dressing, transferring to a bed, or toileting. Fine motor skills are lost. It  is difficult to think clearly.   Incapacitating pain 9  Pain becomes incapacitating. Thought processing is no longer possible. Difficult to remember your own name. Control of movement and coordination are lost.   The worst pain imaginable 10 At this level, most patients pass out from pain. When this level is reached, collapse of the autonomic nervous system occurs, leading to a sudden drop in blood pressure and heart rate. This in turn results in a temporary and dramatic drop in blood flow to the brain, leading to a loss of consciousness. Fainting is one of the body's self defense mechanisms. Passing out puts the brain in a calmed state and causes it to shut down for a while, in order to begin the healing process.    Summary: 1. Refer to this scale when providing [place] with your pain level. 2. Be accurate and careful when reporting your pain level. This will help with your care. 3. Over-reporting your pain level will lead to loss of credibility. 4. Even a level of 1/10 means that there is pain and will be treated at our facility. 5. High, inaccurate reporting will be documented as "Symptom Exaggeration", leading to loss of credibility and suspicions of possible secondary gains such as obtaining more narcotics, or wanting to appear disabled, for fraudulent reasons. 6. Only pain levels of 5 or below will be seen at our facility. 7. Pain levels of 6 and above will be sent to the Emergency Department and the appointment cancelled. ____________________________________________________________________________________________   BMI Assessment: Estimated body mass index is 53.92 kg/m as calculated from the following:   Height as of this encounter: 5' 5"  (1.651 m).   Weight as of this encounter: 324 lb (147 kg).  BMI interpretation table: BMI level Category Range association with higher incidence of chronic pain  <18 kg/m2 Underweight   18.5-24.9 kg/m2 Ideal body weight   25-29.9 kg/m2 Overweight  Increased incidence by 20%  30-34.9 kg/m2 Obese (Class I) Increased incidence by 68%  35-39.9 kg/m2 Severe obesity (Class II) Increased incidence by 136%  >40 kg/m2 Extreme obesity (Class III) Increased incidence by 254%   BMI Readings from Last 4 Encounters:  02/27/18 53.92 kg/m  02/01/18 54.55 kg/m  11/21/17 55.75 kg/m  11/14/17 54.80 kg/m   Wt Readings from Last 4 Encounters:  02/27/18 (!) 324 lb (147 kg)  02/01/18 (!) 327 lb 12.8 oz (148.7 kg)  11/21/17 (!) 335 lb (152 kg)  11/14/17 (!) 329 lb 4.8 oz (149.4 kg)

## 2018-02-27 NOTE — Patient Instructions (Signed)
____________________________________________________________________________________________  Appointment Policy Summary  It is our goal and responsibility to provide the medical community with assistance in the evaluation and management of patients with chronic pain. Unfortunately our resources are limited. Because we do not have an unlimited amount of time, or available appointments, we are required to closely monitor and manage their use. The following rules exist to maximize their use:  Patient's responsibilities: 1. Punctuality:  At what time should I arrive? You should be physically present in our office 30 minutes before your scheduled appointment. Your scheduled appointment is with your assigned healthcare provider. However, it takes 5-10 minutes to be "checked-in", and another 15 minutes for the nurses to do the admission. If you arrive to our office at the time you were given for your appointment, you will end up being at least 20-25 minutes late to your appointment with the provider. 2. Tardiness:  What happens if I arrive only a few minutes after my scheduled appointment time? You will need to reschedule your appointment. The cutoff is your appointment time. This is why it is so important that you arrive at least 30 minutes before that appointment. If you have an appointment scheduled for 10:00 AM and you arrive at 10:01, you will be required to reschedule your appointment.  3. Plan ahead:  Always assume that you will encounter traffic on your way in. Plan for it. If you are dependent on a driver, make sure they understand these rules and the need to arrive early. 4. Other appointments and responsibilities:  Avoid scheduling any other appointments before or after your pain clinic appointments.  5. Be prepared:  Write down everything that you need to discuss with your healthcare provider and give this information to the admitting nurse. Write down the medications that you will need  refilled. Bring your pills and bottles (even the empty ones), to all of your appointments, except for those where a procedure is scheduled. 6. No children or pets:  Find someone to take care of them. It is not appropriate to bring them in. 7. Scheduling changes:  We request "advanced notification" of any changes or cancellations. 8. Advanced notification:  Defined as a time period of more than 24 hours prior to the originally scheduled appointment. This allows for the appointment to be offered to other patients. 9. Rescheduling:  When a visit is rescheduled, it will require the cancellation of the original appointment. For this reason they both fall within the category of "Cancellations".  10. Cancellations:  They require advanced notification. Any cancellation less than 24 hours before the  appointment will be recorded as a "No Show". 11. No Show:  Defined as an unkept appointment where the patient failed to notify or declare to the practice their intention or inability to keep the appointment.  Corrective process for repeat offenders:  1. Tardiness: Three (3) episodes of rescheduling due to late arrivals will be recorded as one (1) "No Show". 2. Cancellation or reschedule: Three (3) cancellations or rescheduling will be recorded as one (1) "No Show". 3. "No Shows": Three (3) "No Shows" within a 12 month period will result in discharge from the practice. ____________________________________________________________________________________________  ____________________________________________________________________________________________  Pain Scale  Introduction: The pain score used by this practice is the Verbal Numerical Rating Scale (VNRS-11). This is an 11-point scale. It is for adults and children 10 years or older. There are significant differences in how the pain score is reported, used, and applied. Forget everything you learned in the past and learn  this scoring system.  General  Information: The scale should reflect your current level of pain. Unless you are specifically asked for the level of your worst pain, or your average pain. If you are asked for one of these two, then it should be understood that it is over the past 24 hours.  Basic Activities of Daily Living (ADL): Personal hygiene, dressing, eating, transferring, and using restroom.  Instructions: Most patients tend to report their level of pain as a combination of two factors, their physical pain and their psychosocial pain. This last one is also known as "suffering" and it is reflection of how physical pain affects you socially and psychologically. From now on, report them separately. From this point on, when asked to report your pain level, report only your physical pain. Use the following table for reference.  Pain Clinic Pain Levels (0-5/10)  Pain Level Score  Description  No Pain 0   Mild pain 1 Nagging, annoying, but does not interfere with basic activities of daily living (ADL). Patients are able to eat, bathe, get dressed, toileting (being able to get on and off the toilet and perform personal hygiene functions), transfer (move in and out of bed or a chair without assistance), and maintain continence (able to control bladder and bowel functions). Blood pressure and heart rate are unaffected. A normal heart rate for a healthy adult ranges from 60 to 100 bpm (beats per minute).   Mild to moderate pain 2 Noticeable and distracting. Impossible to hide from other people. More frequent flare-ups. Still possible to adapt and function close to normal. It can be very annoying and may have occasional stronger flare-ups. With discipline, patients may get used to it and adapt.   Moderate pain 3 Interferes significantly with activities of daily living (ADL). It becomes difficult to feed, bathe, get dressed, get on and off the toilet or to perform personal hygiene functions. Difficult to get in and out of bed or a chair  without assistance. Very distracting. With effort, it can be ignored when deeply involved in activities.   Moderately severe pain 4 Impossible to ignore for more than a few minutes. With effort, patients may still be able to manage work or participate in some social activities. Very difficult to concentrate. Signs of autonomic nervous system discharge are evident: dilated pupils (mydriasis); mild sweating (diaphoresis); sleep interference. Heart rate becomes elevated (>115 bpm). Diastolic blood pressure (lower number) rises above 100 mmHg. Patients find relief in laying down and not moving.   Severe pain 5 Intense and extremely unpleasant. Associated with frowning face and frequent crying. Pain overwhelms the senses.  Ability to do any activity or maintain social relationships becomes significantly limited. Conversation becomes difficult. Pacing back and forth is common, as getting into a comfortable position is nearly impossible. Pain wakes you up from deep sleep. Physical signs will be obvious: pupillary dilation; increased sweating; goosebumps; brisk reflexes; cold, clammy hands and feet; nausea, vomiting or dry heaves; loss of appetite; significant sleep disturbance with inability to fall asleep or to remain asleep. When persistent, significant weight loss is observed due to the complete loss of appetite and sleep deprivation.  Blood pressure and heart rate becomes significantly elevated. Caution: If elevated blood pressure triggers a pounding headache associated with blurred vision, then the patient should immediately seek attention at an urgent or emergency care unit, as these may be signs of an impending stroke.    Emergency Department Pain Levels (6-10/10)  Emergency Room Pain 6 Severely   limiting. Requires emergency care and should not be seen or managed at an outpatient pain management facility. Communication becomes difficult and requires great effort. Assistance to reach the emergency department  may be required. Facial flushing and profuse sweating along with potentially dangerous increases in heart rate and blood pressure will be evident.   Distressing pain 7 Self-care is very difficult. Assistance is required to transport, or use restroom. Assistance to reach the emergency department will be required. Tasks requiring coordination, such as bathing and getting dressed become very difficult.   Disabling pain 8 Self-care is no longer possible. At this level, pain is disabling. The individual is unable to do even the most "basic" activities such as walking, eating, bathing, dressing, transferring to a bed, or toileting. Fine motor skills are lost. It is difficult to think clearly.   Incapacitating pain 9 Pain becomes incapacitating. Thought processing is no longer possible. Difficult to remember your own name. Control of movement and coordination are lost.   The worst pain imaginable 10 At this level, most patients pass out from pain. When this level is reached, collapse of the autonomic nervous system occurs, leading to a sudden drop in blood pressure and heart rate. This in turn results in a temporary and dramatic drop in blood flow to the brain, leading to a loss of consciousness. Fainting is one of the body's self defense mechanisms. Passing out puts the brain in a calmed state and causes it to shut down for a while, in order to begin the healing process.    Summary: 1. Refer to this scale when providing [place] with your pain level. 2. Be accurate and careful when reporting your pain level. This will help with your care. 3. Over-reporting your pain level will lead to loss of credibility. 4. Even a level of 1/10 means that there is pain and will be treated at our facility. 5. High, inaccurate reporting will be documented as "Symptom Exaggeration", leading to loss of credibility and suspicions of possible secondary gains such as obtaining more narcotics, or wanting to appear disabled, for  fraudulent reasons. 6. Only pain levels of 5 or below will be seen at our facility. 7. Pain levels of 6 and above will be sent to the Emergency Department and the appointment cancelled. ____________________________________________________________________________________________   BMI Assessment: Estimated body mass index is 53.92 kg/m as calculated from the following:   Height as of this encounter: 5\' 5"  (1.651 m).   Weight as of this encounter: 324 lb (147 kg).  BMI interpretation table: BMI level Category Range association with higher incidence of chronic pain  <18 kg/m2 Underweight   18.5-24.9 kg/m2 Ideal body weight   25-29.9 kg/m2 Overweight Increased incidence by 20%  30-34.9 kg/m2 Obese (Class I) Increased incidence by 68%  35-39.9 kg/m2 Severe obesity (Class II) Increased incidence by 136%  >40 kg/m2 Extreme obesity (Class III) Increased incidence by 254%   BMI Readings from Last 4 Encounters:  02/27/18 53.92 kg/m  02/01/18 54.55 kg/m  11/21/17 55.75 kg/m  11/14/17 54.80 kg/m   Wt Readings from Last 4 Encounters:  02/27/18 (!) 324 lb (147 kg)  02/01/18 (!) 327 lb 12.8 oz (148.7 kg)  11/21/17 (!) 335 lb (152 kg)  11/14/17 (!) 329 lb 4.8 oz (149.4 kg)

## 2018-02-28 ENCOUNTER — Other Ambulatory Visit: Payer: Self-pay | Admitting: Cardiovascular Disease

## 2018-03-03 ENCOUNTER — Other Ambulatory Visit: Payer: Self-pay | Admitting: Psychiatry

## 2018-03-03 DIAGNOSIS — F331 Major depressive disorder, recurrent, moderate: Secondary | ICD-10-CM

## 2018-03-03 DIAGNOSIS — F4310 Post-traumatic stress disorder, unspecified: Secondary | ICD-10-CM

## 2018-03-03 LAB — COMP. METABOLIC PANEL (12)
AST: 27 IU/L (ref 0–40)
Albumin/Globulin Ratio: 1.7 (ref 1.2–2.2)
Albumin: 4.4 g/dL (ref 3.6–4.8)
Alkaline Phosphatase: 92 IU/L (ref 39–117)
BUN/Creatinine Ratio: 18 (ref 12–28)
BUN: 17 mg/dL (ref 8–27)
Bilirubin Total: 0.4 mg/dL (ref 0.0–1.2)
Calcium: 9.4 mg/dL (ref 8.7–10.3)
Chloride: 101 mmol/L (ref 96–106)
Creatinine, Ser: 0.95 mg/dL (ref 0.57–1.00)
GFR calc Af Amer: 73 mL/min/{1.73_m2} (ref >59)
GFR calc non Af Amer: 63 mL/min/{1.73_m2} (ref >59)
Globulin, Total: 2.6 g/dL (ref 1.5–4.5)
Glucose: 85 mg/dL (ref 65–99)
Potassium: 4.7 mmol/L (ref 3.5–5.2)
Sodium: 143 mmol/L (ref 134–144)
Total Protein: 7 g/dL (ref 6.0–8.5)

## 2018-03-03 LAB — VITAMIN B12: Vitamin B-12: 787 pg/mL (ref 232–1245)

## 2018-03-03 LAB — COMPLIANCE DRUG ANALYSIS, UR

## 2018-03-03 LAB — 25-HYDROXY VITAMIN D LCMS D2+D3
25-Hydroxy, Vitamin D-2: <1 ng/mL
25-Hydroxy, Vitamin D-3: 66 ng/mL
25-Hydroxy, Vitamin D: 66 ng/mL

## 2018-03-03 LAB — C-REACTIVE PROTEIN: CRP: 7 mg/L (ref 0–10)

## 2018-03-03 LAB — SEDIMENTATION RATE: Sed Rate: 33 mm/hr (ref 0–40)

## 2018-03-03 LAB — MAGNESIUM: Magnesium: 2.1 mg/dL (ref 1.6–2.3)

## 2018-03-04 ENCOUNTER — Other Ambulatory Visit: Payer: Self-pay | Admitting: Psychiatry

## 2018-03-04 DIAGNOSIS — F331 Major depressive disorder, recurrent, moderate: Secondary | ICD-10-CM

## 2018-03-08 ENCOUNTER — Ambulatory Visit
Admission: RE | Admit: 2018-03-08 | Discharge: 2018-03-08 | Disposition: A | Discharge status: Home / Self Care | Payer: Medicare Other | Source: Ambulatory Visit | Attending: Family | Admitting: Family

## 2018-03-08 DIAGNOSIS — Z1231 Encounter for screening mammogram for malignant neoplasm of breast: Secondary | ICD-10-CM | POA: Insufficient documentation

## 2018-03-11 ENCOUNTER — Other Ambulatory Visit: Payer: Self-pay | Admitting: Neurology

## 2018-03-11 DIAGNOSIS — G43009 Migraine without aura, not intractable, without status migrainosus: Secondary | ICD-10-CM

## 2018-03-15 ENCOUNTER — Ambulatory Visit: Payer: Medicare Other | Admitting: Psychology

## 2018-03-18 NOTE — Progress Notes (Signed)
Patient's Name: Amber Barnes  MRN: [id number]  Referring Provider: [name], FNP  DOB: Jul 18, 1950  PCP: [name], FNP  DOS: 03/21/2018  Note by: Gaspar Cola, MD  Service setting: Ambulatory outpatient  Specialty: Interventional Pain Management  Location: [facility] (AMB) Pain Management Facility    Patient type: Established   Primary Reason(s) for Visit: Encounter for evaluation before starting new chronic pain management plan of care (Level of risk: moderate) CC: Hip Pain (right)  HPI  Amber Barnes is a 67 y.o. year old, female patient, who comes today for a follow-up evaluation to review the test results and decide on a treatment plan. She has Obesity, Class III, BMI 40-49.9 (morbid obesity) ([place]); Heel spur; Tendonitis; SOB (shortness of breath); Morbid obesity ([place]); Mixed hyperlipidemia; Essential hypertension; Anxiety and depression; Arthritis, senescent; Weight loss due to medication; GERD (gastroesophageal reflux disease); Seasonal rhinitis; Routine physical examination; Dizziness; Family history of ovarian cancer; Fatty liver; New onset a-fib ([place]); Hypokalemia; Left foot pain; Atypical chest pain; Persistent atrial fibrillation ([place]); Acute pulmonary edema ([place]); Acute pain of left shoulder; OSA (obstructive sleep apnea); Restless leg; Bradycardia; Depression, recurrent ([place]); Migraine with aura and without status migrainosus, not intractable; Atherosclerosis of aorta ([place]); Olecranon bursitis of left elbow; Cervical radiculopathy; Chronic right-sided low back pain with bilateral sciatica; Screening mammogram, encounter for; Numbness and tingling; Chronic groin pain (Primary Area of Pain) (Right); Chronic lower extremity pain (Secondary Area of Pain) (Right); Chronic hip pain [facility] Area of Pain) (Right); Chronic low back pain (Fourth Area of Pain) (Bilateral) w/ sciatica (Right); Chronic knee pain (Bilateral); Chronic pain syndrome; Pharmacologic therapy; Disorder of  skeletal system; Problems influencing health status; Lumbar facet joint syndrome (Right); Chronic sacroiliac joint pain (Left); Chronic anticoagulation (Eliquis); Spondylosis without myelopathy or radiculopathy, lumbosacral region; and Other specified dorsopathies, sacral and sacrococcygeal region on their problem list. Her primarily concern today is the Hip Pain (right)  Pain Assessment: Location: Right Hip Radiating: Pain radiaties down right leg to foot Onset: More than a month ago Duration: Chronic pain Quality: Numbness, Discomfort, Aching(pain in right groin and hip) Severity: 9 /10 (subjective, self-reported pain score)  Note: Reported level is inconsistent with clinical observations. Clinically the patient looks like a 3/10 A 3/10 is viewed as "Moderate" and described as significantly interfering with activities of daily living (ADL). It becomes difficult to feed, bathe, get dressed, get on and off the toilet or to perform personal hygiene functions. Difficult to get in and out of bed or a chair without assistance. Very distracting. With effort, it can be ignored when deeply involved in activities. Information on the proper use of the pain scale provided to the patient today. When using our objective Pain Scale, levels between 6 and 10/10 are said to belong in an emergency room, as it progressively worsens from a 6/10, described as severely limiting, requiring emergency care not usually available at an outpatient pain management facility. At a 6/10 level, communication becomes difficult and requires great effort. Assistance to reach the emergency department may be required. Facial flushing and profuse sweating along with potentially dangerous increases in heart rate and blood pressure will be evident. Effect on ADL: difficult to get up and moving, have pain while laying down Timing: Constant Modifying factors: medication, ice and heat BP: (!) 149/79  HR: (!) 59  Amber Barnes comes in today for a  follow-up visit after her initial evaluation on 02/27/2018. Today we went over the results of her tests. These were explained  in "[place]man's terms". During today's appointment we went over my diagnostic impression, as well as the proposed treatment plan.  According to the patient her primary area of pain is in her right groin. He describes a sharp constant pain that gets worse with walking. She admits that she has been having this pain for a while however it has gotten worse over the last 3-4 months. She admits that 4  years ago she had some type of injection which was effective for her pain. She denies any previous physical therapy. She has had recent images.  Her second area of pain is in her right leg. She admits that she has numbness and tingling that goes all the way down the side and front of her leg into the top of her foot. Runs thru back and inside of leg, down to the bottom of the foot (S1). She admits that she does have some weakness. She denies nerve conduction study.  Her fourth area of pain is in her hip (B) (R>L). On the right side it hurts on the area of the Groin, while on the left she points at her SI joint area. She admits that she has difficulty getting into bed at night. Again she has had some type of injection 4 years ago which was effective for the pain. She denies any surgery or physical therapy. She has had recent images.  Her next area pain is in her lower back (B) (R>L).  She admits that this is midline.  She meets that she has had previous surgery at [facility] (around 2015) however does not know which type of surgery.  She admits that was done by a doctor out of [place].  She denies any previous interventional therapy or recent physical therapy.  She did have an MRI.  In considering the treatment plan options, Amber Barnes was reminded that I no longer take patients for medication management only. I asked her to let me know if she had no intention of taking advantage of the  interventional therapies, so that we could make arrangements to provide this space to someone interested. I also made it clear that undergoing interventional therapies for the purpose of getting pain medications is very inappropriate on the part of a patient, and it will not be tolerated in this practice. This type of behavior would suggest true addiction and therefore it requires referral to an addiction specialist.   Further details on both, my assessment(s), as well as the proposed treatment plan, please see below.  Controlled Substance Pharmacotherapy Assessment REMS (Risk Evaluation and Mitigation Strategy)  Analgesic: None (Taking Gabapentin 400 mg BID for RLS). Highest recorded MME/day: 90 mg/day MME/day: 0 mg/day Pill Count: None expected due to no prior prescriptions written by our practice. No notes on file Pharmacokinetics: Liberation and absorption (onset of action): WNL Distribution (time to peak effect): WNL Metabolism and excretion (duration of action): WNL         Pharmacodynamics: Desired effects: Analgesia: Amber Barnes reports >50% benefit. Functional ability: Patient reports that medication allows her to accomplish basic ADLs Clinically meaningful improvement in function (CMIF): Sustained CMIF goals met Perceived effectiveness: Described as relatively effective, allowing for increase in activities of daily living (ADL) Undesirable effects: Side-effects or Adverse reactions: None reported Monitoring: [place] PMP: Online review of the past 76-monthperiod previously conducted. Not applicable at this point since we have not taken over the patient's medication management yet. List of other Serum/Urine Drug Screening Test(s):  No results found  for: AMPHSCRSER, BARBSCRSER, BENZOSCRSER, COCAINSCRSER, COCAINSCRNUR, PCPSCRSER, THCSCRSER, THCU, CANNABQUANT, OPIATESCRSER, OXYSCRSER, PROPOXSCRSER, [facility] List of all UDS test(s) done:  Lab Results  Component Value Date   SUMMARY FINAL  02/27/2018   Last UDS on record: Summary  Date Value Ref Range Status  02/27/2018 FINAL  Final    Comment:    ==================================================================== TOXASSURE COMP DRUG ANALYSIS,UR ==================================================================== Test                             Result       Flag       Units Drug Present and Declared for Prescription Verification   Gabapentin                     PRESENT      EXPECTED   Fluvoxamine                    PRESENT      EXPECTED   Hydroxyzine                    PRESENT      EXPECTED Drug Present not Declared for Prescription Verification   Acetaminophen                  PRESENT      UNEXPECTED   Guaifenesin                    PRESENT      UNEXPECTED    Guaifenesin may be administered as an over-the-counter or    prescription drug; it may also be present as a breakdown product    of methocarbamol. Drug Absent but Declared for Prescription Verification   Trazodone                      Not Detected UNEXPECTED ==================================================================== Test                      Result    Flag   Units      Ref Range   Creatinine              70               mg/dL      >=20 ==================================================================== Declared Medications:  The flagging and interpretation on this report are based on the  following declared medications.  Unexpected results may arise from  inaccuracies in the declared medications.  **Note: The testing scope of this panel includes these medications:  Fluvoxamine (Luvox)  Gabapentin  Hydroxyzine  Trazodone  Trazodone (Desyrel)  **Note: The testing scope of this panel does not include following  reported medications:  Amiodarone  Apixaban  Bisoprolol (Zebeta)  Calcium  Furosemide (Lasix)  Losartan (Losartan Potassium)  Multivitamin  Omeprazole (Nexium)  Oxymetazoline  Potassium  Ranolazine  Rizatriptan  Rosuvastatin   Vitamin B  Vitamin D3 ==================================================================== For clinical consultation, please call [phone]. ====================================================================    UDS interpretation: No unexpected findings.          Medication Assessment Form: Patient introduced to form today Treatment compliance: Treatment may start today if patient agrees with proposed plan. Evaluation of compliance is not applicable at this point Risk Assessment Profile: Aberrant behavior: See initial evaluations. None observed or detected today Comorbid factors increasing risk of overdose: See initial evaluation. No additional risks detected today Opioid risk tool (ORT) (Total  Score): 1 Personal History of Substance Abuse (SUD-Substance use disorder):  Alcohol: Negative  Illegal Drugs: Negative  Rx Drugs: Negative  ORT Risk Level calculation: Low Risk Risk of substance use disorder (SUD): Low Opioid Risk Tool - 03/21/18 0958      Family History of Substance Abuse   Alcohol  Negative    Illegal Drugs  Negative    Rx Drugs  Negative      Personal History of Substance Abuse   Alcohol  Negative    Illegal Drugs  Negative    Rx Drugs  Negative      Age   Age between 37-45 years   No      History of Preadolescent Sexual Abuse   History of Preadolescent Sexual Abuse  Negative or Female      Psychological Disease   Psychological Disease  Negative    Depression  Positive      Total Score   Opioid Risk Tool Scoring  1    Opioid Risk Interpretation  Low Risk      ORT Scoring interpretation table:  Score <3 = Low Risk for SUD  Score between 4-7 = Moderate Risk for SUD  Score >8 = High Risk for Opioid Abuse   Risk Mitigation Strategies:  Patient opioid safety counseling: Completed today. Counseling provided to patient as per "Patient Counseling Document". Document signed by patient, attesting to counseling and understanding Patient-Prescriber Agreement  (PPA): Obtained today.  Controlled substance notification to other providers: Written and sent today.  Pharmacologic Plan: Today we may be taking over the patient's pharmacological regimen. See below.             Laboratory Chemistry  Inflammation Markers (CRP: Acute Phase) (ESR: Chronic Phase) Lab Results  Component Value Date   CRP 7 02/27/2018   ESRSEDRATE 33 02/27/2018                         Renal Function Markers Lab Results  Component Value Date   BUN 17 02/27/2018   CREATININE 0.95 02/27/2018   BCR 18 02/27/2018   GFRAA 73 02/27/2018   GFRNONAA 63 02/27/2018                             Hepatic Function Markers Lab Results  Component Value Date   AST 27 02/27/2018   ALT 23 04/13/2017   ALBUMIN 4.4 02/27/2018   ALKPHOS 92 02/27/2018   HCVAB NEGATIVE 05/12/2016   LIPASE 24 11/27/2016                        Electrolytes Lab Results  Component Value Date   NA 143 02/27/2018   K 4.7 02/27/2018   CL 101 02/27/2018   CALCIUM 9.4 02/27/2018   MG 2.1 02/27/2018   PHOS 4.7 (H) 08/11/2016                        Neuropathy Markers Lab Results  Component Value Date   VITAMINB12 787 02/27/2018   HGBA1C 5.0 04/13/2017   HIV NONREACTIVE 05/12/2016                        CNS Tests Lab Results  Component Value Date   SDES  11/21/2017    THROAT Performed at [facility], [address], [place], [place] [address]  CULT  11/21/2017    NO GROUP A STREP (S.PYOGENES) ISOLATED Performed at [facility], [place] [address], [place], [place] [address]                         Bone Pathology Markers Lab Results  Component Value Date   VD25OH 57.98 05/12/2016   25OHVITD1 66 02/27/2018   25OHVITD2 <1.0 02/27/2018   25OHVITD3 66 02/27/2018                         Coagulation Parameters Lab Results  Component Value Date   INR 1.05 04/30/2017   LABPROT 13.6 04/30/2017   PLT 166 09/28/2017                        Cardiovascular Markers Lab  Results  Component Value Date   BNP 278.0 (H) 08/10/2016   TROPONINI <0.03 09/28/2017   HGB 13.6 09/28/2017   HCT 41.4 09/28/2017                         Note: Lab results reviewed.  Recent Diagnostic Imaging Review  Cervical Imaging: Cervical CT wo contrast:  Results for orders placed during the hospital encounter of 04/30/17  CT Cervical Spine Wo Contrast   Narrative CLINICAL DATA:  Patient fell in bathtub and hit head. Anticoagulation with Eliquis. Complains of dizziness, headache, neck pain, and chest pain.  EXAM: CT HEAD WITHOUT CONTRAST  CT CERVICAL SPINE WITHOUT CONTRAST  TECHNIQUE: Multidetector CT imaging of the head and cervical spine was performed following the standard protocol without intravenous contrast. Multiplanar CT image reconstructions of the cervical spine were also generated.  COMPARISON:  02/06/2011.  FINDINGS: CT HEAD FINDINGS  Brain: No evidence for acute infarction, hemorrhage, mass lesion, hydrocephalus, or extra-axial fluid. Normal cerebral volume. No white matter disease.  Vascular: Calcification of the cavernous internal carotid arteries consistent with cerebrovascular atherosclerotic disease. No signs of intracranial large vessel occlusion.  Skull: Calvarium intact. No focal lesion. RIGHT-sided scalp hematoma without laceration.  Sinuses/Orbits: Negative.  Other: None.  CT CERVICAL SPINE FINDINGS  Alignment: Reversal of the normal cervical lordotic curve. No visible traumatic subluxation.  Skull base and vertebrae: Image quality is reduced due to the patient's body habitus (290 pounds). Within limits for detection due to beam hardening, no visible fracture.  Soft tissues and spinal canal: Reduced image quality. Multilevel spondylosis. No definite spinal hematoma.  Disc levels: Spinal stenosis due to osseous spurring most likely C5-C6.  Upper chest: No visible pneumothorax or upper rib fracture.  Other:  None.  IMPRESSION: No skull fracture or intracranial hemorrhage.  Reduced image quality in the cervical spine due to body habitus. No visible cervical spine fracture, traumatic subluxation, or intraspinal hematoma.  Multilevel spondylosis with reversal of the normal cervical lordotic curve, worst at C5-6.   Electronically Signed   By: Staci Righter M.D.   On: 04/30/2017 11:52    Lumbosacral Imaging: Lumbar MR wo contrast:  Results for orders placed during the hospital encounter of 02/23/18  MR LUMBAR SPINE WO CONTRAST   Narrative CLINICAL DATA:  Low back and right hip and leg pain for 3 months. Numbness and tingling in the right leg.  EXAM: MRI LUMBAR SPINE WITHOUT CONTRAST  TECHNIQUE: Multiplanar, multisequence MR imaging of the lumbar spine was performed. No intravenous contrast was administered.  COMPARISON:  MRI lumbar spine 11/15/2014.  FINDINGS: Segmentation:  Standard.  Alignment: Trace degenerative retrolisthesis L2 on L3 and anterolisthesis L4 on L5 are identified.  Vertebrae:No fracture or worrisome lesion. There is mild degenerative endplate signal change at L2-3.  Conus medullaris and cauda equina: Conus extends to the L1-2 level. Conus and cauda equina appear normal.  Paraspinal and other soft tissues: Small T2 hyperintense lesions in the kidneys are most compatible with cysts.  Disc levels:  T10-11 is imaged in the sagittal plane only. There is a tiny right paracentral protrusion without stenosis.  T11-12: Negative.  T12-L1: Negative.  L1-2: Small central protrusion seen on the prior examination has regressed. There is a minimal bulge but the central canal and foramina are open.  L2-3: Minimal disc bulge.  No stenosis.  L3-4: Moderate facet arthropathy. Minimal disc bulge. There is mild central canal stenosis and minimal left subarticular recess narrowing. The right foramen is widely patent.  L4-5: Moderate to advanced facet  degenerative change is worse on the left. There is slight disc uncovering and bulging but the central spinal canal and foramina appear open.  L5-S1: There is loss of disc space height with a shallow bulge and endplate spur. No stenosis.  IMPRESSION: Scattered facet degenerative disease appears worst at L4-5 on the left. Mild central canal, left subarticular recess and foraminal narrowing are seen at L4-5. No nerve root compression is identified.  Mild central canal and minimal left subarticular recess narrowing L3-4. No nerve root compression.   Electronically Signed   By: Inge Rise M.D.   On: 02/23/2018 16:40    Knee Imaging: Knee-L MR w contrast:  Results for orders placed during the hospital encounter of 05/17/17  MR KNEE LEFT WO CONTRAST   Narrative CLINICAL DATA:  Anterior left knee pain and swelling for 2 months. No known injury.  EXAM: MRI OF THE LEFT KNEE WITHOUT CONTRAST  TECHNIQUE: Multiplanar, multisequence MR imaging of the knee was performed. No intravenous contrast was administered.  COMPARISON:  Plain films left knee 04/13/2017. Report of MRI left knee 02/07/2013 reviewed. Images are not available.  FINDINGS: MENISCI  Medial meniscus: The patient has a very large radial tear of the posterior horn of the medial meniscus. The tear is at the root and there is a gap between the root and remnant of the posterior horn of 1.6 cm identified. The body is somewhat extruded peripherally with fraying along its free edge. A large radial tear is described on the report of the prior MRI.  Lateral meniscus:  Intact.  LIGAMENTS  Cruciates:  Intact.  Collaterals:  Intact.  CARTILAGE  Patellofemoral:  Extensive cartilage loss is present diffusely.  Medial: Markedly thinned throughout with associated joint space narrowing.  Lateral:  Moderately degenerated.  Joint:  Small effusion.  Popliteal Fossa: Baker's cyst measuring 3.3 cm transverse by 1 cm  AP by 5.8 cm craniocaudal is identified.  Extensor Mechanism:  Intact.  Bones: Tricompartmental osteophytosis is present. Subchondral edema is seen about the periphery of the medial compartment and posterior aspect of the medial femoral condyle. Marked subchondral edema is present in the lateral patellar facet. There is also some subchondral edema at the apex of the patella in the superior pole. No fracture.  Other: None.  IMPRESSION: Very large radial tear through the root of the posterior horn of the medial meniscus with a gap of 1.6 cm between the posterior horn and meniscal root identified. Large radial tear is described on report of comparison MRI but the images are not available  for direct comparison.  Advanced osteoarthritis is worst in the medial and patellofemoral compartments.  Baker's cyst.   Electronically Signed   By: Inge Rise M.D.   On: 05/17/2017 14:58    Knee-R DG 3 views:  Results for orders placed during the hospital encounter of 04/13/17  DG Knee 3 Views Right   Narrative CLINICAL DATA:  Bilateral knee pain.  EXAM: BILATERAL KNEES STANDING - 1 VIEW; LEFT KNEE - 3 VIEW; RIGHT KNEE - 3 VIEW  COMPARISON:  None.  FINDINGS: Moderate to advanced tricompartmental degenerative changes most significant in the medial compartments and more significant involving the left knee. There is joint space narrowing and osteophytic spurring. No fracture or osteochondral lesion. No findings for chondrocalcinosis. Small joint effusion on the left and moderate joint effusion of the right.  IMPRESSION: Tricompartmental degenerative changes most significantly involving the medial compartments and greater on the left on the right.  No acute bony findings.  Bilateral joint effusions.   Electronically Signed   By: Marijo Sanes M.D.   On: 04/13/2017 15:04    Knee-L DG 3 views:  Results for orders placed during the hospital encounter of 04/13/17  DG  Knee 3 Views Left   Narrative CLINICAL DATA:  Bilateral knee pain.  EXAM: BILATERAL KNEES STANDING - 1 VIEW; LEFT KNEE - 3 VIEW; RIGHT KNEE - 3 VIEW  COMPARISON:  None.  FINDINGS: Moderate to advanced tricompartmental degenerative changes most significant in the medial compartments and more significant involving the left knee. There is joint space narrowing and osteophytic spurring. No fracture or osteochondral lesion. No findings for chondrocalcinosis. Small joint effusion on the left and moderate joint effusion of the right.  IMPRESSION: Tricompartmental degenerative changes most significantly involving the medial compartments and greater on the left on the right.  No acute bony findings.  Bilateral joint effusions.   Electronically Signed   By: Marijo Sanes M.D.   On: 04/13/2017 15:04    Ankle Imaging: Ankle-L DG Complete:  Results for orders placed in visit on 09/24/15  DG Ankle Complete Left   Narrative See progress note   Foot Imaging: Foot-L DG Complete:  Results for orders placed in visit on 08/14/17  DG Foot Complete Left   Narrative CLINICAL DATA:  Dropped tray on left foot last night. Left foot pain and swelling. Initial encounter.  EXAM: LEFT FOOT - COMPLETE 3+ VIEW  COMPARISON:  None.  FINDINGS: There is no evidence of acute fracture or dislocation. Soft tissue swelling seen along the dorsal aspect of the midfoot.  A prominent plantar calcaneal bone spur is noted. Soft tissue ossification is also seen along the superior margin of the calcaneal tuberosity, likely due to old trauma.  IMPRESSION: Dorsal midfoot soft tissue swelling. No evidence of acute fracture or dislocation.   Electronically Signed   By: Earle Gell M.D.   On: 08/15/2017 08:15    Complexity Note: Imaging results reviewed. Results shared with Amber Barnes, using [place]'s terms.                         Meds   Current Outpatient Medications:  .  amiodarone (PACERONE)  200 MG tablet, Take 1 tablet (200 mg total) by mouth daily., Disp: 90 tablet, Rfl: 3 .  B Complex Vitamins (VITAMIN B COMPLEX PO), Take 1 tablet by mouth daily. , Disp: , Rfl:  .  bisoprolol (ZEBETA) 5 MG tablet, Take 1 tablet (5 mg total) by mouth daily.,  Disp: 90 tablet, Rfl: 3 .  calcium gluconate 500 MG tablet, Take 500 tablets by mouth daily., Disp: , Rfl:  .  Cholecalciferol (VITAMIN D3) 5000 units CAPS, Take 5,000 Units by mouth daily., Disp: , Rfl:  .  ELIQUIS 5 MG TABS tablet, TAKE 1 TABLET BY MOUTH TWICE A DAY, Disp: 60 tablet, Rfl: 3 .  esomeprazole (NEXIUM) 20 MG capsule, Take 20 mg by mouth daily as needed (acid reflux)., Disp: , Rfl:  .  fluvoxaMINE (LUVOX) 50 MG tablet, TAKE 2.5 TABLETS (125 MG TOTAL) BY MOUTH AT BEDTIME., Disp: 75 tablet, Rfl: 2 .  furosemide (LASIX) 20 MG tablet, TAKE 1 TABLET (20 MG TOTAL) BY MOUTH 2 (TWO) TIMES DAILY., Disp: 180 tablet, Rfl: 3 .  gabapentin (NEURONTIN) 400 MG capsule, Take 1 capsule (400 mg total) by mouth 2 (two) times daily., Disp: 180 capsule, Rfl: 1 .  hydrOXYzine (ATARAX/VISTARIL) 10 MG tablet, Take 2 tablets (20 mg total) by mouth 2 (two) times daily as needed., Disp: 120 tablet, Rfl: 1 .  KLOR-CON 10 10 MEQ tablet, TAKE 1 TABLET (10 MEQ TOTAL) BY MOUTH 2 (TWO) TIMES DAILY., Disp: 180 tablet, Rfl: 0 .  losartan (COZAAR) 25 MG tablet, TAKE 1 TABLET BY MOUTH EVERY DAY, Disp: 30 tablet, Rfl: 6 .  Multiple Vitamin (MULTIVITAMIN) tablet, Take 1 tablet by mouth daily., Disp: , Rfl:  .  oxymetazoline (AFRIN) 0.05 % nasal spray, Place 1 spray into both nostrils at bedtime as needed for congestion. , Disp: , Rfl:  .  ranolazine (RANEXA) 500 MG 12 hr tablet, TAKE 1 TABLET BY MOUTH TWICE A DAY, Disp: 60 tablet, Rfl: 3 .  rizatriptan (MAXALT) 10 MG tablet, TAKE 1 TABLET (10 MG TOTAL) BY MOUTH AS NEEDED FOR MIGRAINE. MAY REPEAT IN 2 HOURS IF NEEDED, Disp: 10 tablet, Rfl: 0 .  rosuvastatin (CRESTOR) 20 MG tablet, TAKE 1 TABLET BY MOUTH EVERY DAY, Disp: 90  tablet, Rfl: 0 .  traMADol (ULTRAM) 50 MG tablet, Take 1-2 tablets (50-100 mg total) by mouth every 6 (six) hours as needed for up to 7 days for severe pain., Disp: 56 tablet, Rfl: 0 .  [START ON 03/28/2018] traMADol (ULTRAM) 50 MG tablet, Take 1-2 tablets (50-100 mg total) by mouth every 6 (six) hours as needed for severe pain., Disp: 240 tablet, Rfl: 0 .  traZODone (DESYREL) 50 MG tablet, TAKE 0.5-1 TABLETS (25-50 MG TOTAL) BY MOUTH AT BEDTIME. SLEEP, Disp: 30 tablet, Rfl: 2  ROS  Constitutional: Denies any fever or chills Gastrointestinal: No reported hemesis, hematochezia, vomiting, or acute GI distress Musculoskeletal: Denies any acute onset joint swelling, redness, loss of ROM, or weakness Neurological: No reported episodes of acute onset apraxia, aphasia, dysarthria, agnosia, amnesia, paralysis, loss of coordination, or loss of consciousness  Allergies  Amber Barnes is allergic to prednisone; hydrocodone-acetaminophen; oxycodone; adhesive [tape]; and latex.  [place]  Drug: Amber Barnes  reports that she does not use drugs. Alcohol:  reports that she drank alcohol. Tobacco:  reports that she quit smoking about 14 years ago. Her smoking use included cigarettes. She has a 2.50 pack-year smoking history. She quit smokeless tobacco use about 16 years ago. Medical:  has a past medical history of Achilles tendinitis, Acute medial meniscal tear, Allergy, Anxiety, Arthritis, Atrial fibrillation (HCC), CHF (congestive heart failure) (HCC), Chronic lower back pain, COPD (chronic obstructive pulmonary disease) ([place]), Depression, Fibrocystic breast disease, GERD (gastroesophageal reflux disease), Hyperlipidemia, Hypertension, Insomnia, Lipoma, Migraine, Osteoarthritis, Osteopenia, Tendonitis, Thyroid disease, and Vitamin D deficiency.  Surgical: Amber Barnes  has a past surgical history that includes Thyroidectomy; Colonoscopy; Esophagogastroduodenoscopy; Abdominal hysterectomy; Achilles tendon surgery; Back surgery;  CARDIOVERSION (N/A, 09/07/2016); CARDIOVERSION (N/A, 10/19/2016); and CARDIOVERSION (N/A, 12/14/2016). Family: family history includes Breast cancer in her paternal aunt; Cancer in her mother; Cancer (age of onset: 51) in her sister; Cancer (age of onset: 37) in her father; Depression in her sister; Heart attack (age of onset: 69) in her father; Heart disease in her father; Hyperlipidemia in her father; Hypertension in her father.  Constitutional Exam  General appearance: Well nourished, well developed, and well hydrated. In no apparent acute distress Vitals:   03/21/18 0943  BP: (!) 149/79  Pulse: (!) 59  Temp: 98.1 F (36.7 C)  SpO2: 98%  Weight: (!) 324 lb (147 kg)  Height: 5' 5"  (1.651 m)   BMI Assessment: Estimated body mass index is 53.92 kg/m as calculated from the following:   Height as of this encounter: 5' 5"  (1.651 m).   Weight as of this encounter: 324 lb (147 kg).  BMI interpretation table: BMI level Category Range association with higher incidence of chronic pain  <18 kg/m2 Underweight   18.5-24.9 kg/m2 Ideal body weight   25-29.9 kg/m2 Overweight Increased incidence by 20%  30-34.9 kg/m2 Obese (Class I) Increased incidence by 68%  35-39.9 kg/m2 Severe obesity (Class II) Increased incidence by 136%  >40 kg/m2 Extreme obesity (Class III) Increased incidence by 254%   Patient's current BMI Ideal Body weight  Body mass index is 53.92 kg/m. Ideal body weight: 57 kg (125 lb 10.6 oz) Adjusted ideal body weight: 93 kg (205 lb)   BMI Readings from Last 4 Encounters:  03/21/18 53.92 kg/m  02/27/18 53.92 kg/m  02/01/18 54.55 kg/m  11/21/17 55.75 kg/m   Wt Readings from Last 4 Encounters:  03/21/18 (!) 324 lb (147 kg)  02/27/18 (!) 324 lb (147 kg)  02/01/18 (!) 327 lb 12.8 oz (148.7 kg)  11/21/17 (!) 335 lb (152 kg)  Psych/Mental status: Alert, oriented x 3 (person, place, & time)       Eyes: PERLA Respiratory: No evidence of acute respiratory distress  Cervical  Spine Area Exam  Skin & Axial Inspection: No masses, redness, edema, swelling, or associated skin lesions Alignment: Symmetrical Functional ROM: Unrestricted ROM      Stability: No instability detected Muscle Tone/Strength: Functionally intact. No obvious neuro-muscular anomalies detected. Sensory (Neurological): Unimpaired Palpation: No palpable anomalies              Upper Extremity (UE) Exam    Side: Right upper extremity  Side: Left upper extremity  Skin & Extremity Inspection: Skin color, temperature, and hair growth are WNL. No peripheral edema or cyanosis. No masses, redness, swelling, asymmetry, or associated skin lesions. No contractures.  Skin & Extremity Inspection: Skin color, temperature, and hair growth are WNL. No peripheral edema or cyanosis. No masses, redness, swelling, asymmetry, or associated skin lesions. No contractures.  Functional ROM: Unrestricted ROM          Functional ROM: Unrestricted ROM          Muscle Tone/Strength: Functionally intact. No obvious neuro-muscular anomalies detected.  Muscle Tone/Strength: Functionally intact. No obvious neuro-muscular anomalies detected.  Sensory (Neurological): Unimpaired          Sensory (Neurological): Unimpaired          Palpation: No palpable anomalies              Palpation: No palpable anomalies  Provocative Test(s):  Phalen's test: deferred Tinel's test: deferred Apley's scratch test (touch opposite shoulder):  Action 1 (Across chest): deferred Action 2 (Overhead): deferred Action 3 (LB reach): deferred   Provocative Test(s):  Phalen's test: deferred Tinel's test: deferred Apley's scratch test (touch opposite shoulder):  Action 1 (Across chest): deferred Action 2 (Overhead): deferred Action 3 (LB reach): deferred    Thoracic Spine Area Exam  Skin & Axial Inspection: No masses, redness, or swelling Alignment: Symmetrical Functional ROM: Unrestricted ROM Stability: No instability detected Muscle  Tone/Strength: Functionally intact. No obvious neuro-muscular anomalies detected. Sensory (Neurological): Unimpaired Muscle strength & Tone: No palpable anomalies  Lumbar Spine Area Exam  Skin & Axial Inspection: No masses, redness, or swelling Alignment: Symmetrical Functional ROM: Decreased ROM       Stability: No instability detected Muscle Tone/Strength: Functionally intact. No obvious neuro-muscular anomalies detected. Sensory (Neurological): Movement-associated pain Palpation: Complains of area being tender to palpation       Provocative Tests: Hyperextension/rotation test: (+) on the right for facet joint pain. Lumbar quadrant test (Kemp's test): (+) on the right for facet joint pain. Lateral bending test: deferred today       Patrick's Maneuver: (+) for right-sided S-I arthralgia and for right hip arthralgia FABER test: (+) for right-sided S-I arthralgia and for right hip arthralgia S-I anterior distraction/compression test: deferred today         S-I lateral compression test: deferred today         S-I Thigh-thrust test: deferred today         S-I Gaenslen's test: deferred today          Gait & Posture Assessment  Ambulation: Limited Gait: Relatively normal for age and body habitus Posture: WNL   Lower Extremity Exam    Side: Right lower extremity  Side: Left lower extremity  Stability: No instability observed          Stability: No instability observed          Skin & Extremity Inspection: Skin color, temperature, and hair growth are WNL. No peripheral edema or cyanosis. No masses, redness, swelling, asymmetry, or associated skin lesions. No contractures.  Skin & Extremity Inspection: Skin color, temperature, and hair growth are WNL. No peripheral edema or cyanosis. No masses, redness, swelling, asymmetry, or associated skin lesions. No contractures.  Functional ROM: Decreased ROM for hip and knee joints          Functional ROM: Unrestricted ROM                  Muscle  Tone/Strength: Able to Toe-walk & Heel-walk without problems  Muscle Tone/Strength: Able to Toe-walk & Heel-walk without problems  Sensory (Neurological): Articular pain pattern  Sensory (Neurological): Unimpaired  Palpation: No palpable anomalies  Palpation: No palpable anomalies   Assessment & Plan  Primary Diagnosis & Pertinent Problem List: The primary encounter diagnosis was Chronic pain syndrome. Diagnoses of Chronic groin pain (Primary Area of Pain) (Right), Chronic lower extremity pain (Secondary Area of Pain) (Right), Chronic hip pain (Tertiary Area of Pain) (Right), Chronic knee pain (Bilateral), Chronic low back pain (Fourth Area of Pain) (Bilateral) w/ sciatica (Right), Spondylosis without myelopathy or radiculopathy, lumbosacral region, Lumbar facet joint syndrome (R), Other specified dorsopathies, sacral and sacrococcygeal region, Chronic left sacroiliac joint pain, and Chronic anticoagulation (Eliquis) were also pertinent to this visit.  Visit Diagnosis: 1. Chronic pain syndrome   2. Chronic groin pain (Primary Area of Pain) (Right)   3. Chronic lower  extremity pain (Secondary Area of Pain) (Right)   4. Chronic hip pain [facility] Area of Pain) (Right)   5. Chronic knee pain (Bilateral)   6. Chronic low back pain (Fourth Area of Pain) (Bilateral) w/ sciatica (Right)   7. Spondylosis without myelopathy or radiculopathy, lumbosacral region   8. Lumbar facet joint syndrome (R)   9. Other specified dorsopathies, sacral and sacrococcygeal region   10. Chronic left sacroiliac joint pain   11. Chronic anticoagulation (Eliquis)    Problems updated and reviewed during this visit: Problem  Spondylosis Without Myelopathy Or Radiculopathy, Lumbosacral Region  Other Specified Dorsopathies, Sacral and Sacrococcygeal Region  Lumbar facet joint syndrome (Right)  Chronic sacroiliac joint pain (Left)  Pharmacologic Therapy  Disorder of Skeletal System  Problems Influencing Health Status    Plan of Care  Pharmacotherapy (Medications Ordered): Meds ordered this encounter  Medications  . traMADol (ULTRAM) 50 MG tablet    Sig: Take 1-2 tablets (50-100 mg total) by mouth every 6 (six) hours as needed for up to 7 days for severe pain.    Dispense:  56 tablet    Refill:  0    Medication for Chronic Pain (G89.4). [place] STOP ACT - Not applicable. Fill one day early if pharmacy is closed on scheduled refill date.  Do not fill until: 03/21/18 To last until: 03/28/18  . traMADol (ULTRAM) 50 MG tablet    Sig: Take 1-2 tablets (50-100 mg total) by mouth every 6 (six) hours as needed for severe pain.    Dispense:  240 tablet    Refill:  0    Medication for Chronic Pain (G89.4). [place] STOP ACT - Not applicable. Fill one day early if pharmacy is closed on scheduled refill date.  Do not fill until: 03/28/18 To last until: 04/27/18    Procedure Orders     HIP INJECTION Lab Orders  No laboratory test(s) ordered today    Imaging Orders     MR HIP RIGHT WO CONTRAST Referral Orders  No referral(s) requested today    Pharmacological management options:  Opioid Analgesics: We'll take over management today. See above orders Membrane stabilizer: We have discussed the possibility of optimizing this mode of therapy, if tolerated Muscle relaxant: We have discussed the possibility of a trial NSAID: We have discussed the possibility of a trial Other analgesic(s): To be determined at a later time   Interventional management options: Planned, scheduled, and/or pending:    NOTE: ELIQUIS anticoagulation due to A-fib. (Stop: 3 days) (Restart: 6 Hrs) Diagnostic right intra-articular hip joint injection #1 under fluoroscopic guidance and IV sedation   Considering:   Diagnostic intra-articular right hip injection Diagnostic right sacroiliac joint injection possible right sacroiliac radiofrequency ablation Diagnostic right lumbar epidural steroid injection Diagnostic bilateral lumbar facet  nerve block Possible bilateral lumbar facet radiofrequency ablation Diagnostic bilateral intra-articular knee injection Diagnostic bilateral Hyalgan series   PRN Procedures:   None at this time   Provider-requested follow-up: Return for Procedure (w/ sedation): (R) IA Hip inj. #1.  Future Appointments  Date Time Provider [place]  03/30/2018 10:00 AM [name], FNP [id number]  04/04/2018  2:00 PM [facility]    Primary Care Physician: [name], FNP Location: [facility] Note by: Gaspar Cola, MD Date: 03/21/2018; Time: 11:00 AM

## 2018-03-19 ENCOUNTER — Other Ambulatory Visit: Payer: Self-pay | Admitting: Psychiatry

## 2018-03-19 DIAGNOSIS — F4310 Post-traumatic stress disorder, unspecified: Secondary | ICD-10-CM

## 2018-03-19 DIAGNOSIS — F331 Major depressive disorder, recurrent, moderate: Secondary | ICD-10-CM

## 2018-03-21 ENCOUNTER — Other Ambulatory Visit: Payer: Self-pay

## 2018-03-21 ENCOUNTER — Encounter: Payer: Self-pay | Admitting: Pain Medicine

## 2018-03-21 ENCOUNTER — Ambulatory Visit: Payer: Medicare Other | Attending: Pain Medicine | Admitting: Pain Medicine

## 2018-03-21 ENCOUNTER — Telehealth: Payer: Self-pay | Admitting: Cardiovascular Disease

## 2018-03-21 VITALS — BP 149/79 | HR 59 | Temp 98.1°F | Ht 65.0 in | Wt 324.0 lb

## 2018-03-21 DIAGNOSIS — G4733 Obstructive sleep apnea (adult) (pediatric): Secondary | ICD-10-CM | POA: Diagnosis not present

## 2018-03-21 DIAGNOSIS — I481 Persistent atrial fibrillation: Secondary | ICD-10-CM | POA: Diagnosis not present

## 2018-03-21 DIAGNOSIS — M47817 Spondylosis without myelopathy or radiculopathy, lumbosacral region: Secondary | ICD-10-CM

## 2018-03-21 DIAGNOSIS — F419 Anxiety disorder, unspecified: Secondary | ICD-10-CM | POA: Insufficient documentation

## 2018-03-21 DIAGNOSIS — M25561 Pain in right knee: Secondary | ICD-10-CM | POA: Diagnosis not present

## 2018-03-21 DIAGNOSIS — G894 Chronic pain syndrome: Secondary | ICD-10-CM | POA: Insufficient documentation

## 2018-03-21 DIAGNOSIS — R1030 Lower abdominal pain, unspecified: Secondary | ICD-10-CM | POA: Diagnosis not present

## 2018-03-21 DIAGNOSIS — Z91040 Latex allergy status: Secondary | ICD-10-CM | POA: Insufficient documentation

## 2018-03-21 DIAGNOSIS — Z6841 Body Mass Index (BMI) 40.0 and over, adult: Secondary | ICD-10-CM | POA: Diagnosis not present

## 2018-03-21 DIAGNOSIS — Z79891 Long term (current) use of opiate analgesic: Secondary | ICD-10-CM | POA: Insufficient documentation

## 2018-03-21 DIAGNOSIS — Z7901 Long term (current) use of anticoagulants: Secondary | ICD-10-CM | POA: Diagnosis not present

## 2018-03-21 DIAGNOSIS — M25562 Pain in left knee: Secondary | ICD-10-CM | POA: Diagnosis not present

## 2018-03-21 DIAGNOSIS — I509 Heart failure, unspecified: Secondary | ICD-10-CM | POA: Insufficient documentation

## 2018-03-21 DIAGNOSIS — Z888 Allergy status to other drugs, medicaments and biological substances status: Secondary | ICD-10-CM | POA: Insufficient documentation

## 2018-03-21 DIAGNOSIS — Z90710 Acquired absence of both cervix and uterus: Secondary | ICD-10-CM | POA: Insufficient documentation

## 2018-03-21 DIAGNOSIS — M7730 Calcaneal spur, unspecified foot: Secondary | ICD-10-CM | POA: Diagnosis not present

## 2018-03-21 DIAGNOSIS — R1031 Right lower quadrant pain: Secondary | ICD-10-CM

## 2018-03-21 DIAGNOSIS — M79604 Pain in right leg: Secondary | ICD-10-CM | POA: Insufficient documentation

## 2018-03-21 DIAGNOSIS — I700 Atherosclerosis of aorta: Secondary | ICD-10-CM | POA: Insufficient documentation

## 2018-03-21 DIAGNOSIS — I110 Hypertensive heart disease with heart failure: Secondary | ICD-10-CM | POA: Insufficient documentation

## 2018-03-21 DIAGNOSIS — K219 Gastro-esophageal reflux disease without esophagitis: Secondary | ICD-10-CM | POA: Insufficient documentation

## 2018-03-21 DIAGNOSIS — F329 Major depressive disorder, single episode, unspecified: Secondary | ICD-10-CM | POA: Insufficient documentation

## 2018-03-21 DIAGNOSIS — Z8249 Family history of ischemic heart disease and other diseases of the circulatory system: Secondary | ICD-10-CM | POA: Insufficient documentation

## 2018-03-21 DIAGNOSIS — M25551 Pain in right hip: Secondary | ICD-10-CM | POA: Diagnosis not present

## 2018-03-21 DIAGNOSIS — M7022 Olecranon bursitis, left elbow: Secondary | ICD-10-CM | POA: Insufficient documentation

## 2018-03-21 DIAGNOSIS — K760 Fatty (change of) liver, not elsewhere classified: Secondary | ICD-10-CM | POA: Diagnosis not present

## 2018-03-21 DIAGNOSIS — M7660 Achilles tendinitis, unspecified leg: Secondary | ICD-10-CM | POA: Insufficient documentation

## 2018-03-21 DIAGNOSIS — J449 Chronic obstructive pulmonary disease, unspecified: Secondary | ICD-10-CM | POA: Insufficient documentation

## 2018-03-21 DIAGNOSIS — Z79899 Other long term (current) drug therapy: Secondary | ICD-10-CM | POA: Diagnosis not present

## 2018-03-21 DIAGNOSIS — M5441 Lumbago with sciatica, right side: Secondary | ICD-10-CM | POA: Insufficient documentation

## 2018-03-21 DIAGNOSIS — E782 Mixed hyperlipidemia: Secondary | ICD-10-CM | POA: Diagnosis not present

## 2018-03-21 DIAGNOSIS — Z87891 Personal history of nicotine dependence: Secondary | ICD-10-CM | POA: Insufficient documentation

## 2018-03-21 DIAGNOSIS — M47816 Spondylosis without myelopathy or radiculopathy, lumbar region: Secondary | ICD-10-CM

## 2018-03-21 DIAGNOSIS — M5412 Radiculopathy, cervical region: Secondary | ICD-10-CM | POA: Insufficient documentation

## 2018-03-21 DIAGNOSIS — Z803 Family history of malignant neoplasm of breast: Secondary | ICD-10-CM | POA: Insufficient documentation

## 2018-03-21 DIAGNOSIS — M5388 Other specified dorsopathies, sacral and sacrococcygeal region: Secondary | ICD-10-CM

## 2018-03-21 DIAGNOSIS — M5442 Lumbago with sciatica, left side: Secondary | ICD-10-CM | POA: Diagnosis not present

## 2018-03-21 DIAGNOSIS — Z818 Family history of other mental and behavioral disorders: Secondary | ICD-10-CM | POA: Insufficient documentation

## 2018-03-21 DIAGNOSIS — Z9889 Other specified postprocedural states: Secondary | ICD-10-CM | POA: Insufficient documentation

## 2018-03-21 DIAGNOSIS — G8929 Other chronic pain: Secondary | ICD-10-CM

## 2018-03-21 DIAGNOSIS — M533 Sacrococcygeal disorders, not elsewhere classified: Secondary | ICD-10-CM

## 2018-03-21 DIAGNOSIS — Z885 Allergy status to narcotic agent status: Secondary | ICD-10-CM | POA: Insufficient documentation

## 2018-03-21 MED ORDER — TRAMADOL HCL 50 MG PO TABS: 50.0000 mg | ORAL_TABLET | Freq: Four times a day (QID) | ORAL | 0 refills | Status: DC | PRN

## 2018-03-21 NOTE — Telephone Encounter (Signed)
   [facility] Pre-operative Risk Assessment    Request for surgical clearance:  1. What type of surgery is being performed? Not listed  2. When is this surgery scheduled? Not listed  3. What type of clearance is required (medical clearance vs. Pharmacy clearance to hold med vs. Both)? pharmacy  4. Are there any medications that need to be held prior to surgery and how long? Eliquis   5. Practice name and name of physician performing surgery? [facility]   6. What is your office phone number [phone]    7.   What is your office fax number [phone]  8.   Anesthesia type (None, local, MAC, general) ? Not listed   Lucienne Minks 03/21/2018, 3:33 PM  _________________________________________________________________   (provider comments below)

## 2018-03-21 NOTE — Telephone Encounter (Signed)
Will need procedure details before clearance can be provided.

## 2018-03-21 NOTE — Patient Instructions (Addendum)
____________________________________________________________________________________________  Preparing for Procedure with Sedation  Instructions: . Oral Intake: Do not eat or drink anything for at least 8 hours prior to your procedure. . Transportation: Public transportation is not allowed. Bring an adult driver. The driver must be physically present in our waiting room before any procedure can be started. . Physical Assistance: Bring an adult physically capable of assisting you, in the event you need help. This adult should keep you company at home for at least 6 hours after the procedure. . Blood Pressure Medicine: Take your blood pressure medicine with a sip of water the morning of the procedure. . Blood thinners: Notify our staff if you are taking any blood thinners. Depending on which one you take, there will be specific instructions on how and when to stop it. . Diabetics on insulin: Notify the staff so that you can be scheduled 1st case in the morning. If your diabetes requires high dose insulin, take only  of your normal insulin dose the morning of the procedure and notify the staff that you have done so. . Preventing infections: Shower with an antibacterial soap the morning of your procedure. . Build-up your immune system: Take 1000 mg of Vitamin C with every meal (3 times a day) the day prior to your procedure. . Antibiotics: Inform the staff if you have a condition or reason that requires you to take antibiotics before dental procedures. . Pregnancy: If you are pregnant, call and cancel the procedure. . Sickness: If you have a cold, fever, or any active infections, call and cancel the procedure. . Arrival: You must be in the facility at least 30 minutes prior to your scheduled procedure. . Children: Do not bring children with you. . Dress appropriately: Bring dark clothing that you would not mind if they get stained. . Valuables: Do not bring any jewelry or valuables.  Procedure  appointments are reserved for interventional treatments only. . No Prescription Refills. . No medication changes will be discussed during procedure appointments. . No disability issues will be discussed.  Reasons to call and reschedule or cancel your procedure: (Following these recommendations will minimize the risk of a serious complication.) . Surgeries: Avoid having procedures within 2 weeks of any surgery. (Avoid for 2 weeks before or after any surgery). . Flu Shots: Avoid having procedures within 2 weeks of a flu shots or . (Avoid for 2 weeks before or after immunizations). . Barium: Avoid having a procedure within 7-10 days after having had a radiological study involving the use of radiological contrast. (Myelograms, Barium swallow or enema study). . Heart attacks: Avoid any elective procedures or surgeries for the initial 6 months after a "Myocardial Infarction" (Heart Attack). . Blood thinners: It is imperative that you stop these medications before procedures. Let us know if you if you take any blood thinner.  . Infection: Avoid procedures during or within two weeks of an infection (including chest colds or gastrointestinal problems). Symptoms associated with infections include: Localized redness, fever, chills, night sweats or profuse sweating, burning sensation when voiding, cough, congestion, stuffiness, runny nose, sore throat, diarrhea, nausea, vomiting, cold or Flu symptoms, recent or current infections. It is specially important if the infection is over the area that we intend to treat. . Heart and lung problems: Symptoms that may suggest an active cardiopulmonary problem include: cough, chest pain, breathing difficulties or shortness of breath, dizziness, ankle swelling, uncontrolled high or unusually low blood pressure, and/or palpitations. If you are experiencing any of these symptoms, cancel   your procedure and contact your primary care physician for an evaluation.  Remember:   Regular Business hours are:  Monday to Thursday 8:00 AM to 4:00 PM  Provider's Schedule: Milinda Pointer, MD:  Procedure days: Tuesday and Thursday 7:30 AM to 4:00 PM  Gillis Santa, MD:  Procedure days: Monday and Wednesday 7:30 AM to 4:00 PM ____________________________________________________________________________________________   ____________________________________________________________________________________________  Medication Rules  Applies to: All patients receiving prescriptions (written or electronic).  Pharmacy of record: Pharmacy where electronic prescriptions will be sent. If written prescriptions are taken to a different pharmacy, please inform the nursing staff. The pharmacy listed in the electronic medical record should be the one where you would like electronic prescriptions to be sent.  Prescription refills: Only during scheduled appointments. Applies to both, written and electronic prescriptions.  NOTE: The following applies primarily to controlled substances (Opioid* Pain Medications).   Patient's responsibilities: 1. Pain Pills: Bring all pain pills to every appointment (except for procedure appointments). 2. Pill Bottles: Bring pills in original pharmacy bottle. Always bring newest bottle. Bring bottle, even if empty. 3. Medication refills: You are responsible for knowing and keeping track of what medications you need refilled. The day before your appointment, write a list of all prescriptions that need to be refilled. Bring that list to your appointment and give it to the admitting nurse. Prescriptions will be written only during appointments. If you forget a medication, it will not be "Called in", "Faxed", or "electronically sent". You will need to get another appointment to get these prescribed. 4. Prescription Accuracy: You are responsible for carefully inspecting your prescriptions before leaving our office. Have the discharge nurse carefully go over  each prescription with you, before taking them home. Make sure that your name is accurately spelled, that your address is correct. Check the name and dose of your medication to make sure it is accurate. Check the number of pills, and the written instructions to make sure they are clear and accurate. Make sure that you are given enough medication to last until your next medication refill appointment. 5. Taking Medication: Take medication as prescribed. Never take more pills than instructed. Never take medication more frequently than prescribed. Taking less pills or less frequently is permitted and encouraged, when it comes to controlled substances (written prescriptions).  6. Inform other Doctors: Always inform, all of your healthcare providers, of all the medications you take. 7. Pain Medication from other Providers: You are not allowed to accept any additional pain medication from any other Doctor or Healthcare provider. There are two exceptions to this rule. (see below) In the event that you require additional pain medication, you are responsible for notifying us, as stated below. 8. Medication Agreement: You are responsible for carefully reading and following our Medication Agreement. This must be signed before receiving any prescriptions from our practice. Safely store a copy of your signed Agreement. Violations to the Agreement will result in no further prescriptions. (Additional copies of our Medication Agreement are available upon request.) 9. Laws, Rules, & Regulations: All patients are expected to follow all Federal and [employer], [employer], Rules, [employer]. Ignorance of the Laws does not constitute a valid excuse. The use of any illegal substances is prohibited. 10. Adopted CDC guidelines & recommendations: Target dosing levels will be at or below 60 MME/day. Use of benzodiazepines** is not recommended.  Exceptions: There are only two exceptions to the rule of not receiving pain medications  from other Healthcare Providers. 1. Exception #1 (Emergencies): In the  event of an emergency (i.e.: accident requiring emergency care), you are allowed to receive additional pain medication. However, you are responsible for: As soon as you are able, call our office [phone], at any time of the day or night, and leave a message stating your name, the date and nature of the emergency, and the name and dose of the medication prescribed. In the event that your call is answered by a member of our staff, make sure to document and save the date, time, and the name of the person that took your information.  2. Exception #2 (Planned Surgery): In the event that you are scheduled by another doctor or dentist to have any type of surgery or procedure, you are allowed (for a period no longer than 30 days), to receive additional pain medication, for the acute post-op pain. However, in this case, you are responsible for picking up a copy of our "Post-op Pain Management for Surgeons" handout, and giving it to your surgeon or dentist. This document is available at our office, and does not require an appointment to obtain it. Simply go to our office during business hours (Monday-Thursday from 8:00 AM to 4:00 PM) (Friday 8:00 AM to 12:00 Noon) or if you have a scheduled appointment with [place], prior to your surgery, and ask for it by name. In addition, you will need to provide [place] with your name, name of your surgeon, type of surgery, and date of procedure or surgery.  *Opioid medications include: morphine, codeine, oxycodone, oxymorphone, hydrocodone, hydromorphone, meperidine, tramadol, tapentadol, buprenorphine, fentanyl, methadone. **Benzodiazepine medications include: diazepam (Valium), alprazolam (Xanax), clonazepam (Klonopine), lorazepam (Ativan), clorazepate (Tranxene), chlordiazepoxide (Librium), estazolam (Prosom), oxazepam (Serax), temazepam (Restoril), triazolam (Halcion) (Last updated:  09/07/2017) ____________________________________________________________________________________________   ____________________________________________________________________________________________  Medication Recommendations and Reminders  Applies to: All patients receiving prescriptions (written and/or electronic).  Medication Rules & Regulations: These rules and regulations exist for your safety and that of others. They are not flexible and neither are we. Dismissing or ignoring them will be considered "non-compliance" with medication therapy, resulting in complete and irreversible termination of such therapy. (See document titled "Medication Rules" for more details.) In all conscience, because of safety reasons, we cannot continue providing a therapy where the patient does not follow instructions.  Pharmacy of record:   Definition: This is the pharmacy where your electronic prescriptions will be sent.   We do not endorse any particular pharmacy.  You are not restricted in your choice of pharmacy.  The pharmacy listed in the electronic medical record should be the one where you want electronic prescriptions to be sent.  If you choose to change pharmacy, simply notify our nursing staff of your choice of new pharmacy.  Recommendations:  Keep all of your pain medications in a safe place, under lock and key, even if you live alone.   After you fill your prescription, take 1 week's worth of pills and put them away in a safe place. You should keep a separate, properly labeled bottle for this purpose. The remainder should be kept in the original bottle. Use this as your primary supply, until it runs out. Once it's gone, then you know that you have 1 week's worth of medicine, and it is time to come in for a prescription refill. If you do this correctly, it is unlikely that you will ever run out of medicine.  To make sure that the above recommendation works, it is very important that you  make sure your medication refill appointments are  scheduled at least 1 week before you run out of medicine. To do this in an effective manner, make sure that you do not leave the office without scheduling your next medication management appointment. Always ask the nursing staff to show you in your prescription , when your medication will be running out. Then arrange for the receptionist to get you a return appointment, at least 7 days before you run out of medicine. Do not wait until you have 1 or 2 pills left, to come in. This is very poor planning and does not take into consideration that we may need to cancel appointments due to bad weather, sickness, or emergencies affecting our staff.  "Partial Fill": If for any reason your pharmacy does not have enough pills/tablets to completely fill or refill your prescription, do not allow for a "partial fill". You will need a separate prescription to fill the remaining amount, which we will not provide. If the reason for the partial fill is your insurance, you will need to talk to the pharmacist about payment alternatives for the remaining tablets, but again, do not accept a partial fill.  Prescription refills and/or changes in medication(s):   Prescription refills, and/or changes in dose or medication, will be conducted only during scheduled medication management appointments. (Applies to both, written and electronic prescriptions.)  No refills on procedure days. No medication will be changed or started on procedure days. No changes, adjustments, and/or refills will be conducted on a procedure day. Doing so will interfere with the diagnostic portion of the procedure.  No phone refills. No medications will be "called into the pharmacy".  No Fax refills.  No weekend refills.  No Holliday refills.  No after hours refills.  Remember:  Business hours are:  Monday to Thursday 8:00 AM to 4:00 PM Provider's Schedule: Dionisio David, NP - Appointments are:   Medication management: Monday to Thursday 8:00 AM to 4:00 PM Milinda Pointer, MD - Appointments are:  Medication management: Monday and Wednesday 8:00 AM to 4:00 PM Procedure day: Tuesday and Thursday 7:30 AM to 4:00 PM Gillis Santa, MD - Appointments are:  Medication management: Tuesday and Thursday 8:00 AM to 4:00 PM Procedure day: Monday and Wednesday 7:30 AM to 4:00 PM (Last update: 09/07/2017) ____________________________________________________________________________________________   ____________________________________________________________________________________________  CANNABIDIOL (AKA: CBD Oil or Pills)  Applies to: All patients receiving prescriptions of controlled substances (written and/or electronic).  General Information: Cannabidiol (CBD) was discovered in [birth year]. It is one of some 113 identified cannabinoids in cannabis (Marijuana) plants, accounting for up to 40% of the plant's extract. As of 2018, preliminary clinical research on cannabidiol included studies of anxiety, cognition, movement disorders, and pain.  Cannabidiol is consummed in multiple ways, including inhalation of cannabis smoke or vapor, as an aerosol spray into the cheek, and by mouth. It may be supplied as CBD oil containing CBD as the active ingredient (no added tetrahydrocannabinol (THC) or terpenes), a full-plant CBD-dominant hemp extract oil, capsules, dried cannabis, or as a liquid solution. CBD is thought not have the same psychoactivity as THC, and may affect the actions of THC. Studies suggest that CBD may interact with different biological targets, including cannabinoid receptors and other neurotransmitter receptors. As of 2018 the mechanism of action for its biological effects has not been determined.  In the [place], cannabidiol has a limited approval by the Food and Drug Administration (FDA) for treatment of only two types of epilepsy disorders. The side effects of long-term use of the  drug include  somnolence, decreased appetite, diarrhea, fatigue, malaise, weakness, sleeping problems, and others.  CBD remains a Schedule I drug prohibited for any use.  Legality: Some manufacturers ship CBD products nationally, an illegal action which the FDA has not enforced in 2018, with CBD remaining the subject of an FDA investigational new drug evaluation, and is not considered legal as a dietary supplement or food ingredient as of December 2018. Federal illegality has made it difficult historically to conduct research on CBD. CBD is openly sold in head shops and health food stores in some states where such sales have not been explicitly legalized.  Warning: Because it is not FDA approved for general use or treatment of pain, it is not required to undergo the same manufacturing controls as prescription drugs.  This means that the available cannabidiol (CBD) may be contaminated with THC.  If this is the case, it will trigger a positive urine drug screen (UDS) test for cannabinoids (Marijuana).  Because a positive UDS for illicit substances is a violation of our medication agreement, your opioid analgesics (pain medicine) may be permanently discontinued. (Last update: 09/28/2017) ____________________________________________________________________________________________   ____________________________________________________________________________________________  Pain Scale  Introduction: The pain score used by this practice is the Verbal Numerical Rating Scale (VNRS-11). This is an 11-point scale. It is for adults and children 10 years or older. There are significant differences in how the pain score is reported, used, and applied. Forget everything you learned in the past and learn this scoring system.  General Information: The scale should reflect your current level of pain. Unless you are specifically asked for the level of your worst pain, or your average pain. If you are asked for one of  these two, then it should be understood that it is over the past 24 hours.  Basic Activities of Daily Living (ADL): Personal hygiene, dressing, eating, transferring, and using restroom.  Instructions: Most patients tend to report their level of pain as a combination of two factors, their physical pain and their psychosocial pain. This last one is also known as "suffering" and it is reflection of how physical pain affects you socially and psychologically. From now on, report them separately. From this point on, when asked to report your pain level, report only your physical pain. Use the following table for reference.  Pain Clinic Pain Levels (0-5/10)  Pain Level Score  Description  No Pain 0   Mild pain 1 Nagging, annoying, but does not interfere with basic activities of daily living (ADL). Patients are able to eat, bathe, get dressed, toileting (being able to get on and off the toilet and perform personal hygiene functions), transfer (move in and out of bed or a chair without assistance), and maintain continence (able to control bladder and bowel functions). Blood pressure and heart rate are unaffected. A normal heart rate for a healthy adult ranges from 60 to 100 bpm (beats per minute).   Mild to moderate pain 2 Noticeable and distracting. Impossible to hide from other people. More frequent flare-ups. Still possible to adapt and function close to normal. It can be very annoying and may have occasional stronger flare-ups. With discipline, patients may get used to it and adapt.   Moderate pain 3 Interferes significantly with activities of daily living (ADL). It becomes difficult to feed, bathe, get dressed, get on and off the toilet or to perform personal hygiene functions. Difficult to get in and out of bed or a chair without assistance. Very distracting. With effort, it can be ignored when  deeply involved in activities.   Moderately severe pain 4 Impossible to ignore for more than a few minutes.  With effort, patients may still be able to manage work or participate in some social activities. Very difficult to concentrate. Signs of autonomic nervous system discharge are evident: dilated pupils (mydriasis); mild sweating (diaphoresis); sleep interference. Heart rate becomes elevated (>115 bpm). Diastolic blood pressure (lower number) rises above 100 mmHg. Patients find relief in laying down and not moving.   Severe pain 5 Intense and extremely unpleasant. Associated with frowning face and frequent crying. Pain overwhelms the senses.  Ability to do any activity or maintain social relationships becomes significantly limited. Conversation becomes difficult. Pacing back and forth is common, as getting into a comfortable position is nearly impossible. Pain wakes you up from deep sleep. Physical signs will be obvious: pupillary dilation; increased sweating; goosebumps; brisk reflexes; cold, clammy hands and feet; nausea, vomiting or dry heaves; loss of appetite; significant sleep disturbance with inability to fall asleep or to remain asleep. When persistent, significant weight loss is observed due to the complete loss of appetite and sleep deprivation.  Blood pressure and heart rate becomes significantly elevated. Caution: If elevated blood pressure triggers a pounding headache associated with blurred vision, then the patient should immediately seek attention at an urgent or emergency care unit, as these may be signs of an impending stroke.    Emergency Department Pain Levels (6-10/10)  Emergency Room Pain 6 Severely limiting. Requires emergency care and should not be seen or managed at an outpatient pain management facility. Communication becomes difficult and requires great effort. Assistance to reach the emergency department may be required. Facial flushing and profuse sweating along with potentially dangerous increases in heart rate and blood pressure will be evident.   Distressing pain 7 Self-care is  very difficult. Assistance is required to transport, or use restroom. Assistance to reach the emergency department will be required. Tasks requiring coordination, such as bathing and getting dressed become very difficult.   Disabling pain 8 Self-care is no longer possible. At this level, pain is disabling. The individual is unable to do even the most "basic" activities such as walking, eating, bathing, dressing, transferring to a bed, or toileting. Fine motor skills are lost. It is difficult to think clearly.   Incapacitating pain 9 Pain becomes incapacitating. Thought processing is no longer possible. Difficult to remember your own name. Control of movement and coordination are lost.   The worst pain imaginable 10 At this level, most patients pass out from pain. When this level is reached, collapse of the autonomic nervous system occurs, leading to a sudden drop in blood pressure and heart rate. This in turn results in a temporary and dramatic drop in blood flow to the brain, leading to a loss of consciousness. Fainting is one of the body's self defense mechanisms. Passing out puts the brain in a calmed state and causes it to shut down for a while, in order to begin the healing process.    Summary: 1. Refer to this scale when providing [place] with your pain level. 2. Be accurate and careful when reporting your pain level. This will help with your care. 3. Over-reporting your pain level will lead to loss of credibility. 4. Even a level of 1/10 means that there is pain and will be treated at our facility. 5. High, inaccurate reporting will be documented as "Symptom Exaggeration", leading to loss of credibility and suspicions of possible secondary gains such as obtaining more  narcotics, or wanting to appear disabled, for fraudulent reasons. 6. Only pain levels of 5 or below will be seen at our facility. 7. Pain levels of 6 and above will be sent to the Emergency Department and the appointment  cancelled. ____________________________________________________________________________________________

## 2018-03-22 NOTE — Telephone Encounter (Signed)
Called [facility] pain management to get procedure details.   Procedure: Right hip injection with versed and fentanyl  Date of procedure:TBD

## 2018-03-22 NOTE — Telephone Encounter (Signed)
Pt takes Eliquis for afib with CHADS2VASc score of 3 (age, sex, HTN). Renal function is normal. Ok to hold Eliquis 1-2 days prior to hip injection.

## 2018-03-22 NOTE — Telephone Encounter (Signed)
   Primary Cardiologist: Ida Rogue, MD  See pharmacist notes. I will route this recommendation to the requesting party via Epic fax function and remove from pre-op pool.  Please call with questions.  Rosaria Ferries, PA-C 03/22/2018, 4:07 PM

## 2018-03-26 ENCOUNTER — Ambulatory Visit: Payer: Medicare Other | Admitting: Psychology

## 2018-03-26 DIAGNOSIS — F418 Other specified anxiety disorders: Secondary | ICD-10-CM | POA: Diagnosis not present

## 2018-03-26 DIAGNOSIS — F322 Major depressive disorder, single episode, severe without psychotic features: Secondary | ICD-10-CM | POA: Diagnosis not present

## 2018-03-27 DIAGNOSIS — M5388 Other specified dorsopathies, sacral and sacrococcygeal region: Secondary | ICD-10-CM | POA: Insufficient documentation

## 2018-03-27 DIAGNOSIS — M47817 Spondylosis without myelopathy or radiculopathy, lumbosacral region: Secondary | ICD-10-CM | POA: Insufficient documentation

## 2018-03-30 ENCOUNTER — Ambulatory Visit (INDEPENDENT_AMBULATORY_CARE_PROVIDER_SITE_OTHER): Payer: Medicare Other | Admitting: Family

## 2018-03-30 ENCOUNTER — Encounter: Payer: Self-pay | Admitting: Family

## 2018-03-30 VITALS — BP 122/70 | HR 69 | Temp 97.6°F | Resp 18 | Ht 65.0 in | Wt 328.0 lb

## 2018-03-30 DIAGNOSIS — F339 Major depressive disorder, recurrent, unspecified: Secondary | ICD-10-CM

## 2018-03-30 DIAGNOSIS — G4733 Obstructive sleep apnea (adult) (pediatric): Secondary | ICD-10-CM

## 2018-03-30 DIAGNOSIS — Z0000 Encounter for general adult medical examination without abnormal findings: Secondary | ICD-10-CM

## 2018-03-30 DIAGNOSIS — G8929 Other chronic pain: Secondary | ICD-10-CM

## 2018-03-30 DIAGNOSIS — M5442 Lumbago with sciatica, left side: Secondary | ICD-10-CM

## 2018-03-30 DIAGNOSIS — Z23 Encounter for immunization: Secondary | ICD-10-CM | POA: Diagnosis not present

## 2018-03-30 DIAGNOSIS — I10 Essential (primary) hypertension: Secondary | ICD-10-CM

## 2018-03-30 DIAGNOSIS — M5441 Lumbago with sciatica, right side: Secondary | ICD-10-CM

## 2018-03-30 NOTE — Progress Notes (Signed)
Subjective:    Patient ID: Amber Barnes, female    DOB: [birth year], 67 y.o.   MRN: [id number]  CC: Amber Barnes is a 67 y.o. female who presents today for physical exam.    HPI: Doing well. No complaints.   Sister had ovarian cancer. Declines screening or referral to geneticist today.   Following with Amber Barnes for chronic hip and LBP. Considering surgery and trying to loose weight. Focused on diet.    HTN- compliant with medication. Denies exertional chest pain or pressure, numbness or tingling radiating to left arm or jaw, palpitations, dizziness, frequent headaches, changes in vision, or shortness of breath.   OSA- not wearing cipap as worsens anxiety.   Depression- following with Amber Barnes. doing well on luvox. No si/hi. Seeing Amber Barnes regularly.        Colorectal Cancer Screening: due Breast Cancer Screening: Mammogram UTD Cervical Cancer Screening: h/o hysterectomy. No vaginal bleeding, abdominal bloating , distention.  Bone Health screening/DEXA for 65+: Due.  Lung Cancer Screening: quit 21 years ago.   Immunizations       Tetanus -utd         Pneumococcal - Candidate for.  Labs: Screening labs today. Exercise: No exercise.  Alcohol use:  occasional Smoking/tobacco use: former smoker.  Wears seat belt: Yes.  HISTORY:  Past Medical History:  Diagnosis Date  . Achilles tendinitis   . Acute medial meniscal tear   . Allergy   . Anxiety   . Arthritis   . Atrial fibrillation ([place])    a. new onset 07/2016; b. CHADS2VASc --> 2 (HTN, female); c. eliquis  . CHF (congestive heart failure) ([place])   . Chronic lower back pain   . COPD (chronic obstructive pulmonary disease) ([place])   . Depression   . Fibrocystic breast disease   . GERD (gastroesophageal reflux disease)   . Hyperlipidemia   . Hypertension   . Insomnia   . Lipoma   . Migraine   . Osteoarthritis    left knee  . Osteopenia   . Tendonitis   . Thyroid disease   . Vitamin D deficiency     Past  Surgical History:  Procedure Laterality Date  . ABDOMINAL HYSTERECTOMY     ovaries left  . ACHILLES TENDON SURGERY     2012,01/2017  . BACK SURGERY    . CARDIOVERSION N/A 09/07/2016   Procedure: CARDIOVERSION;  Surgeon: Minna Merritts, MD;  Location: [facility] ORS;  Service: Cardiovascular;  Laterality: N/A;  . CARDIOVERSION N/A 10/19/2016   Procedure: CARDIOVERSION;  Surgeon: Minna Merritts, MD;  Location: [facility] OR[facility];  Service: Cardiovascular;  Laterality: N/A;  . CARDIOVERSION N/A 12/14/2016   Procedure: CARDIOVERSION;  Surgeon: Minna Merritts, MD;  Location: [facility] ORS;  Service: Cardiovascular;  Laterality: N/A;  . COLONOSCOPY    . ESOPHAGOGASTRODUODENOSCOPY    . THYROIDECTOMY     thyroid lobectomy secondary to a benign nodule   Family History  Problem Relation Age of Onset  . Cancer Sister 76       ovarian  . Depression Sister   . Cancer Father 81       colon  . Heart disease Father   . Heart attack Father 30       died from MI  . Hyperlipidemia Father   . Hypertension Father   . Cancer Mother        leukemia  . Breast cancer Paternal Aunt        20's  . Migraines Neg Hx  ALLERGIES: Prednisone; Hydrocodone-acetaminophen; Oxycodone; Adhesive [tape]; and Latex  Current Outpatient Medications on File Prior to Visit  Medication Sig Dispense Refill  . amiodarone (PACERONE) 200 MG tablet Take 1 tablet (200 mg total) by mouth daily. 90 tablet 3  . B Complex Vitamins (VITAMIN B COMPLEX PO) Take 1 tablet by mouth daily.     . bisoprolol (ZEBETA) 5 MG tablet Take 1 tablet (5 mg total) by mouth daily. 90 tablet 3  . calcium gluconate 500 MG tablet Take 500 tablets by mouth daily.    . Cholecalciferol (VITAMIN D3) 5000 units CAPS Take 5,000 Units by mouth daily.    Marland Kitchen ELIQUIS 5 MG TABS tablet TAKE 1 TABLET BY MOUTH TWICE A DAY 60 tablet 3  . esomeprazole (NEXIUM) 20 MG capsule Take 20 mg by mouth daily as needed (acid reflux).    . fluvoxaMINE (LUVOX) 50 MG tablet TAKE 2.5  TABLETS (125 MG TOTAL) BY MOUTH AT BEDTIME. 75 tablet 2  . furosemide (LASIX) 20 MG tablet TAKE 1 TABLET (20 MG TOTAL) BY MOUTH 2 (TWO) TIMES DAILY. 180 tablet 3  . gabapentin (NEURONTIN) 400 MG capsule Take 1 capsule (400 mg total) by mouth 2 (two) times daily. 180 capsule 1  . hydrOXYzine (ATARAX/VISTARIL) 10 MG tablet Take 2 tablets (20 mg total) by mouth 2 (two) times daily as needed. 120 tablet 1  . KLOR-CON 10 10 MEQ tablet TAKE 1 TABLET (10 MEQ TOTAL) BY MOUTH 2 (TWO) TIMES DAILY. 180 tablet 0  . losartan (COZAAR) 25 MG tablet TAKE 1 TABLET BY MOUTH EVERY DAY 30 tablet 6  . Multiple Vitamin (MULTIVITAMIN) tablet Take 1 tablet by mouth daily.    Marland Kitchen oxymetazoline (AFRIN) 0.05 % nasal spray Place 1 spray into both nostrils at bedtime as needed for congestion.     . ranolazine (RANEXA) 500 MG 12 hr tablet TAKE 1 TABLET BY MOUTH TWICE A DAY 60 tablet 3  . rizatriptan (MAXALT) 10 MG tablet TAKE 1 TABLET (10 MG TOTAL) BY MOUTH AS NEEDED FOR MIGRAINE. MAY REPEAT IN 2 HOURS IF NEEDED 10 tablet 0  . rosuvastatin (CRESTOR) 20 MG tablet TAKE 1 TABLET BY MOUTH EVERY DAY 90 tablet 0  . traMADol (ULTRAM) 50 MG tablet Take 1-2 tablets (50-100 mg total) by mouth every 6 (six) hours as needed for severe pain. 240 tablet 0  . traZODone (DESYREL) 50 MG tablet TAKE 0.5-1 TABLETS (25-50 MG TOTAL) BY MOUTH AT BEDTIME. SLEEP 30 tablet 2  . traMADol (ULTRAM) 50 MG tablet Take 1-2 tablets (50-100 mg total) by mouth every 6 (six) hours as needed for up to 7 days for severe pain. 56 tablet 0   No current facility-administered medications on file prior to visit.     Social History   Tobacco Use  . Smoking status: Former Smoker    Packs/day: 0.25    Years: 10.00    Pack years: 2.50    Types: Cigarettes    Last attempt to quit: 2005    Years since quitting: 14.7  . Smokeless tobacco: Former Systems developer    Quit date: 07/11/2001  . Tobacco comment: smoked for 10 years, 1 pack every 2-3 days  Substance Use Topics  .  Alcohol use: Not Currently    Comment: social, occasional  . Drug use: No    Review of Systems  Constitutional: Negative for chills, fever and unexpected weight change.  HENT: Negative for congestion.   Respiratory: Negative for cough.   Cardiovascular: Negative for chest  pain, palpitations and leg swelling.  Gastrointestinal: Negative for abdominal distention, abdominal pain, nausea and vomiting.  Genitourinary: Negative for dyspareunia and vaginal bleeding.  Musculoskeletal: Positive for arthralgias (hips). Negative for myalgias.  Skin: Negative for rash.  Neurological: Negative for headaches.  Hematological: Negative for adenopathy.  Psychiatric/Behavioral: Negative for confusion and suicidal ideas.      Objective:    BP 122/70 (BP Location: Left Arm, Patient Position: Sitting, Cuff Size: Large)   Pulse 69   Temp 97.6 F (36.4 C) (Oral)   Resp 18   Ht 5\' 5"  (1.651 m)   Wt (!) 328 lb (148.8 kg)   SpO2 93%   BMI 54.58 kg/m   BP Readings from Last 3 Encounters:  03/30/18 122/70  03/21/18 (!) 149/79  02/27/18 (!) 161/80   Wt Readings from Last 3 Encounters:  03/30/18 (!) 328 lb (148.8 kg)  03/21/18 (!) 324 lb (147 kg)  02/27/18 (!) 324 lb (147 kg)    Physical Exam  Constitutional: She appears well-developed and well-nourished.  Eyes: Conjunctivae are normal.  Neck: No thyroid mass and no thyromegaly present.  Cardiovascular: Normal rate, regular rhythm, normal heart sounds and normal pulses.  Pulmonary/Chest: Effort normal and breath sounds normal. She has no wheezes. She has no rhonchi. She has no rales. Right breast exhibits no inverted nipple, no mass, no nipple discharge, no skin change and no tenderness. Left breast exhibits no inverted nipple, no mass, no nipple discharge, no skin change and no tenderness. Breasts are symmetrical.  CBE performed.   Lymphadenopathy:       Head (right side): No submental, no submandibular, no tonsillar, no preauricular, no  posterior auricular and no occipital adenopathy present.       Head (left side): No submental, no submandibular, no tonsillar, no preauricular, no posterior auricular and no occipital adenopathy present.    She has no cervical adenopathy.       Right cervical: No superficial cervical, no deep cervical and no posterior cervical adenopathy present.      Left cervical: No superficial cervical, no deep cervical and no posterior cervical adenopathy present.    She has no axillary adenopathy.  Neurological: She is alert.  Skin: Skin is warm and dry.  Psychiatric: She has a normal mood and affect. Her speech is normal and behavior is normal. Thought content normal.  Vitals reviewed.      Assessment & Plan:   Problem List Items Addressed This Visit      Cardiovascular and Mediastinum   Essential hypertension    Well-controlled.  No changes made regimen today        Respiratory   OSA (obstructive sleep apnea)    Not compliant.  Patient will speak with her dentist about oral appliance.  Discussed risks of untreated sleep apnea including increased risk for stroke, heart attack.  Patient verbalized understanding of this.        Nervous and Auditory   Chronic right-sided low back pain with bilateral sciatica (Chronic)    Following with pain management.  Working on losing weight.  Will follow        Other   Routine physical examination - Primary    Clinical breast exam performed.  Ordered colonoscopy.  Deferred pelvic exam in the absence of pelvic concerns, history of hysterectomy.  Patient politely declines any further evaluation by genetics, ovarian cancer screening.      Relevant Orders   Ambulatory referral to Gastroenterology   DG Bone Density  Comprehensive metabolic panel   Hemoglobin A1c   Lipid panel   TSH   VITAMIN D 25 Hydroxy (Vit-D Deficiency, Fractures)   Depression, recurrent ([place])    Following with psychiatry now.  Will follow          I am having Amber Barnes maintain her multivitamin, B Complex Vitamins (VITAMIN B COMPLEX PO), oxymetazoline, Vitamin D3, esomeprazole, calcium gluconate, amiodarone, bisoprolol, furosemide, gabapentin, ELIQUIS, ranolazine, losartan, rosuvastatin, hydrOXYzine, KLOR-CON 10, traZODone, rizatriptan, fluvoxaMINE, traMADol, and traMADol.   No orders of the defined types were placed in this encounter.   Return precautions given.   Risks, benefits, and alternatives of the medications and treatment plan prescribed today were discussed, and patient expressed understanding.   Education regarding symptom management and diagnosis given to patient on AVS.   Continue to follow with Amber Hawthorne, FNP for routine health maintenance.   Amber Barnes and I agreed with plan.   Amber Paris, FNP

## 2018-03-30 NOTE — Assessment & Plan Note (Signed)
Not compliant.  Patient will speak with her dentist about oral appliance.  Discussed risks of untreated sleep apnea including increased risk for stroke, heart attack.  Patient verbalized understanding of this.

## 2018-03-30 NOTE — Assessment & Plan Note (Signed)
Following with psychiatry now.  Will follow

## 2018-03-30 NOTE — Assessment & Plan Note (Signed)
Well-controlled.  No changes made regimen today

## 2018-03-30 NOTE — Assessment & Plan Note (Signed)
Clinical breast exam performed.  Ordered colonoscopy.  Deferred pelvic exam in the absence of pelvic concerns, history of hysterectomy.  Patient politely declines any further evaluation by genetics, ovarian cancer screening.

## 2018-03-30 NOTE — Addendum Note (Signed)
Addended by: Johna Sheriff on: 03/30/2018 04:20 PM   Modules accepted: Orders

## 2018-03-30 NOTE — Patient Instructions (Addendum)
Today we discussed referrals, orders. Bone density, colonoscopy   I have placed these orders in the system for you.  Please be sure to give [place] a call if you have not heard from our office regarding scheduling a test or regarding referral in a timely manner.  It is very important that you let me know as soon as possible.    Speak with your dentist about an oral appliance for sleep study. This is VERY important as untreated, this can increase your risk for stroke and heart attack.   Health Maintenance for Postmenopausal Women Menopause is a normal process in which your reproductive ability comes to an end. This process happens gradually over a span of months to years, usually between the ages of 59 and 37. Menopause is complete when you have missed 12 consecutive menstrual periods. It is important to talk with your health care provider about some of the most common conditions that affect postmenopausal women, such as heart disease, cancer, and bone loss (osteoporosis). Adopting a healthy lifestyle and getting preventive care can help to promote your health and wellness. Those actions can also lower your chances of developing some of these common conditions. What should I know about menopause? During menopause, you may experience a number of symptoms, such as:  Moderate-to-severe hot flashes.  Night sweats.  Decrease in sex drive.  Mood swings.  Headaches.  Tiredness.  Irritability.  Memory problems.  Insomnia.  Choosing to treat or not to treat menopausal changes is an individual decision that you make with your health care provider. What should I know about hormone replacement therapy and supplements? Hormone therapy products are effective for treating symptoms that are associated with menopause, such as hot flashes and night sweats. Hormone replacement carries certain risks, especially as you become older. If you are thinking about using estrogen or estrogen with progestin  treatments, discuss the benefits and risks with your health care provider. What should I know about heart disease and stroke? Heart disease, heart attack, and stroke become more likely as you age. This may be due, in part, to the hormonal changes that your body experiences during menopause. These can affect how your body processes dietary fats, triglycerides, and cholesterol. Heart attack and stroke are both medical emergencies. There are many things that you can do to help prevent heart disease and stroke:  Have your blood pressure checked at least every 1-2 years. High blood pressure causes heart disease and increases the risk of stroke.  If you are 42-20 years old, ask your health care provider if you should take aspirin to prevent a heart attack or a stroke.  Do not use any tobacco products, including cigarettes, chewing tobacco, or electronic cigarettes. If you need help quitting, ask your health care provider.  It is important to eat a healthy diet and maintain a healthy weight. ? Be sure to include plenty of vegetables, fruits, low-fat dairy products, and lean protein. ? Avoid eating foods that are high in solid fats, added sugars, or salt (sodium).  Get regular exercise. This is one of the most important things that you can do for your health. ? Try to exercise for at least 150 minutes each week. The type of exercise that you do should increase your heart rate and make you sweat. This is known as moderate-intensity exercise. ? Try to do strengthening exercises at least twice each week. Do these in addition to the moderate-intensity exercise.  Know your numbers.Ask your health care provider to check  your cholesterol and your blood glucose. Continue to have your blood tested as directed by your health care provider.  What should I know about cancer screening? There are several types of cancer. Take the following steps to reduce your risk and to catch any cancer development as early as  possible. Breast Cancer  Practice breast self-awareness. ? This means understanding how your breasts normally appear and feel. ? It also means doing regular breast self-exams. Let your health care provider know about any changes, no matter how small.  If you are 44 or older, have a clinician do a breast exam (clinical breast exam or CBE) every year. Depending on your age, family history, and medical history, it may be recommended that you also have a yearly breast X-ray (mammogram).  If you have a family history of breast cancer, talk with your health care provider about genetic screening.  If you are at high risk for breast cancer, talk with your health care provider about having an MRI and a mammogram every year.  Breast cancer (BRCA) gene test is recommended for women who have family members with BRCA-related cancers. Results of the assessment will determine the need for genetic counseling and BRCA1 and for BRCA2 testing. BRCA-related cancers include these types: ? Breast. This occurs in males or females. ? Ovarian. ? Tubal. This may also be called fallopian tube cancer. ? Cancer of the abdominal or pelvic lining (peritoneal cancer). ? Prostate. ? Pancreatic.  Cervical, Uterine, and Ovarian Cancer Your health care provider may recommend that you be screened regularly for cancer of the pelvic organs. These include your ovaries, uterus, and vagina. This screening involves a pelvic exam, which includes checking for microscopic changes to the surface of your cervix (Pap test).  For women ages 21-65, health care providers may recommend a pelvic exam and a Pap test every three years. For women ages 30-65, they may recommend the Pap test and pelvic exam, combined with testing for human papilloma virus (HPV), every five years. Some types of HPV increase your risk of cervical cancer. Testing for HPV may also be done on women of any age who have unclear Pap test results.  Other health care  providers may not recommend any screening for nonpregnant women who are considered low risk for pelvic cancer and have no symptoms. Ask your health care provider if a screening pelvic exam is right for you.  If you have had past treatment for cervical cancer or a condition that could lead to cancer, you need Pap tests and screening for cancer for at least 20 years after your treatment. If Pap tests have been discontinued for you, your risk factors (such as having a new sexual partner) need to be reassessed to determine if you should start having screenings again. Some women have medical problems that increase the chance of getting cervical cancer. In these cases, your health care provider may recommend that you have screening and Pap tests more often.  If you have a family history of uterine cancer or ovarian cancer, talk with your health care provider about genetic screening.  If you have vaginal bleeding after reaching menopause, tell your health care provider.  There are currently no reliable tests available to screen for ovarian cancer.  Lung Cancer Lung cancer screening is recommended for adults 37-76 years old who are at high risk for lung cancer because of a history of smoking. A yearly low-dose CT scan of the lungs is recommended if you:  Currently smoke.  Have a history of at least 30 pack-years of smoking and you currently smoke or have quit within the past 15 years. A pack-year is smoking an average of one pack of cigarettes per day for one year.  Yearly screening should:  Continue until it has been 15 years since you quit.  Stop if you develop a health problem that would prevent you from having lung cancer treatment.  Colorectal Cancer  This type of cancer can be detected and can often be prevented.  Routine colorectal cancer screening usually begins at age 71 and continues through age 60.  If you have risk factors for colon cancer, your health care provider may recommend  that you be screened at an earlier age.  If you have a family history of colorectal cancer, talk with your health care provider about genetic screening.  Your health care provider may also recommend using home test kits to check for hidden blood in your stool.  A small camera at the end of a tube can be used to examine your colon directly (sigmoidoscopy or colonoscopy). This is done to check for the earliest forms of colorectal cancer.  Direct examination of the colon should be repeated every 5-10 years until age 22. However, if early forms of precancerous polyps or small growths are found or if you have a family history or genetic risk for colorectal cancer, you may need to be screened more often.  Skin Cancer  Check your skin from head to toe regularly.  Monitor any moles. Be sure to tell your health care provider: ? About any new moles or changes in moles, especially if there is a change in a mole's shape or color. ? If you have a mole that is larger than the size of a pencil eraser.  If any of your family members has a history of skin cancer, especially at a young age, talk with your health care provider about genetic screening.  Always use sunscreen. Apply sunscreen liberally and repeatedly throughout the day.  Whenever you are outside, protect yourself by wearing long sleeves, pants, a wide-brimmed hat, and sunglasses.  What should I know about osteoporosis? Osteoporosis is a condition in which bone destruction happens more quickly than new bone creation. After menopause, you may be at an increased risk for osteoporosis. To help prevent osteoporosis or the bone fractures that can happen because of osteoporosis, the following is recommended:  If you are 77-66 years old, get at least 1,000 mg of calcium and at least 600 mg of vitamin D per day.  If you are older than age 31 but younger than age 67, get at least 1,200 mg of calcium and at least 600 mg of vitamin D per day.  If you  are older than age 96, get at least 1,200 mg of calcium and at least 800 mg of vitamin D per day.  Smoking and excessive alcohol intake increase the risk of osteoporosis. Eat foods that are rich in calcium and vitamin D, and do weight-bearing exercises several times each week as directed by your health care provider. What should I know about how menopause affects my mental health? Depression may occur at any age, but it is more common as you become older. Common symptoms of depression include:  Low or sad mood.  Changes in sleep patterns.  Changes in appetite or eating patterns.  Feeling an overall lack of motivation or enjoyment of activities that you previously enjoyed.  Frequent crying spells.  Talk with your  health care provider if you think that you are experiencing depression. What should I know about immunizations? It is important that you get and maintain your immunizations. These include:  Tetanus, diphtheria, and pertussis (Tdap) booster vaccine.  Influenza every year before the flu season begins.  Pneumonia vaccine.  Shingles vaccine.  Your health care provider may also recommend other immunizations. This information is not intended to replace advice given to you by your health care provider. Make sure you discuss any questions you have with your health care provider. Document Released: 08/19/2005 Document Revised: 01/15/2016 Document Reviewed: 03/31/2015 Elsevier Interactive Patient Education  2018 [employer].

## 2018-03-30 NOTE — Assessment & Plan Note (Signed)
Following with pain management.  Working on losing weight.  Will follow

## 2018-04-02 ENCOUNTER — Other Ambulatory Visit: Payer: Self-pay | Admitting: Family

## 2018-04-03 ENCOUNTER — Other Ambulatory Visit (INDEPENDENT_AMBULATORY_CARE_PROVIDER_SITE_OTHER): Payer: Medicare Other

## 2018-04-03 DIAGNOSIS — Z0000 Encounter for general adult medical examination without abnormal findings: Secondary | ICD-10-CM

## 2018-04-03 LAB — LIPID PANEL
Cholesterol: 163 mg/dL (ref 0–200)
HDL: 69.2 mg/dL (ref >39.00)
LDL Cholesterol: 74 mg/dL (ref 0–99)
NonHDL: 93.59
Total CHOL/HDL Ratio: 2
Triglycerides: 97 mg/dL (ref 0.0–149.0)
VLDL: 19.4 mg/dL (ref 0.0–40.0)

## 2018-04-03 LAB — COMPREHENSIVE METABOLIC PANEL
ALT: 33 U/L (ref 0–35)
AST: 28 U/L (ref 0–37)
Albumin: 4 g/dL (ref 3.5–5.2)
Alkaline Phosphatase: 70 U/L (ref 39–117)
BUN: 18 mg/dL (ref 6–23)
CO2: 29 mEq/L (ref 19–32)
Calcium: 9.1 mg/dL (ref 8.4–10.5)
Chloride: 102 mEq/L (ref 96–112)
Creatinine, Ser: 0.95 mg/dL (ref 0.40–1.20)
GFR: 62.68 mL/min (ref >60.00)
Glucose, Bld: 102 mg/dL — ABNORMAL HIGH (ref 70–99)
Potassium: 4 mEq/L (ref 3.5–5.1)
Sodium: 140 mEq/L (ref 135–145)
Total Bilirubin: 0.5 mg/dL (ref 0.2–1.2)
Total Protein: 7.1 g/dL (ref 6.0–8.3)

## 2018-04-03 LAB — VITAMIN D 25 HYDROXY (VIT D DEFICIENCY, FRACTURES): VITD: 49.29 ng/mL (ref 30.00–100.00)

## 2018-04-03 LAB — TSH: TSH: 4.5 u[IU]/mL (ref 0.35–4.50)

## 2018-04-03 LAB — HEMOGLOBIN A1C: Hgb A1c MFr Bld: 5.5 % (ref 4.6–6.5)

## 2018-04-03 NOTE — Telephone Encounter (Signed)
Last refill 02/13/18 Last office visit 03/30/18 No office visit scheduled

## 2018-04-04 ENCOUNTER — Ambulatory Visit
Admission: RE | Admit: 2018-04-04 | Discharge: 2018-04-04 | Disposition: A | Discharge status: Home / Self Care | Payer: Medicare Other | Source: Ambulatory Visit | Attending: Pain Medicine | Admitting: Pain Medicine

## 2018-04-04 DIAGNOSIS — M47816 Spondylosis without myelopathy or radiculopathy, lumbar region: Secondary | ICD-10-CM | POA: Insufficient documentation

## 2018-04-04 DIAGNOSIS — G8929 Other chronic pain: Secondary | ICD-10-CM | POA: Insufficient documentation

## 2018-04-04 DIAGNOSIS — M1611 Unilateral primary osteoarthritis, right hip: Secondary | ICD-10-CM | POA: Diagnosis not present

## 2018-04-04 DIAGNOSIS — M25551 Pain in right hip: Secondary | ICD-10-CM | POA: Diagnosis present

## 2018-04-05 ENCOUNTER — Other Ambulatory Visit: Payer: Self-pay

## 2018-04-05 ENCOUNTER — Ambulatory Visit
Admission: RE | Admit: 2018-04-05 | Discharge: 2018-04-05 | Disposition: A | Discharge status: Home / Self Care | Payer: Medicare Other | Source: Ambulatory Visit | Attending: Pain Medicine | Admitting: Pain Medicine

## 2018-04-05 ENCOUNTER — Ambulatory Visit (HOSPITAL_BASED_OUTPATIENT_CLINIC_OR_DEPARTMENT_OTHER): Payer: Medicare Other | Admitting: Pain Medicine

## 2018-04-05 ENCOUNTER — Encounter: Payer: Self-pay | Admitting: Pain Medicine

## 2018-04-05 ENCOUNTER — Telehealth: Payer: Self-pay | Admitting: Cardiovascular Disease

## 2018-04-05 VITALS — BP 191/88 | HR 52 | Temp 97.4°F | Resp 16 | Ht 65.0 in | Wt 320.0 lb

## 2018-04-05 DIAGNOSIS — Z1211 Encounter for screening for malignant neoplasm of colon: Secondary | ICD-10-CM

## 2018-04-05 DIAGNOSIS — G8929 Other chronic pain: Secondary | ICD-10-CM | POA: Insufficient documentation

## 2018-04-05 DIAGNOSIS — Z79899 Other long term (current) drug therapy: Secondary | ICD-10-CM | POA: Insufficient documentation

## 2018-04-05 DIAGNOSIS — M25551 Pain in right hip: Secondary | ICD-10-CM

## 2018-04-05 DIAGNOSIS — Z885 Allergy status to narcotic agent status: Secondary | ICD-10-CM | POA: Diagnosis not present

## 2018-04-05 DIAGNOSIS — F419 Anxiety disorder, unspecified: Secondary | ICD-10-CM | POA: Insufficient documentation

## 2018-04-05 DIAGNOSIS — Z7901 Long term (current) use of anticoagulants: Secondary | ICD-10-CM

## 2018-04-05 DIAGNOSIS — Z91040 Latex allergy status: Secondary | ICD-10-CM

## 2018-04-05 DIAGNOSIS — E890 Postprocedural hypothyroidism: Secondary | ICD-10-CM | POA: Diagnosis not present

## 2018-04-05 DIAGNOSIS — Z90710 Acquired absence of both cervix and uterus: Secondary | ICD-10-CM | POA: Diagnosis not present

## 2018-04-05 DIAGNOSIS — Z888 Allergy status to other drugs, medicaments and biological substances status: Secondary | ICD-10-CM | POA: Insufficient documentation

## 2018-04-05 DIAGNOSIS — M1611 Unilateral primary osteoarthritis, right hip: Secondary | ICD-10-CM

## 2018-04-05 MED ORDER — ROPIVACAINE HCL 2 MG/ML IJ SOLN
4.0000 mL | Freq: Once | INTRAMUSCULAR | Status: AC
  Administered 2018-04-05: 4 mL via INTRA_ARTICULAR
  Filled 2018-04-05: qty 10

## 2018-04-05 MED ORDER — LACTATED RINGERS IV SOLN: 1000.0000 mL | Freq: Once | INTRAVENOUS | Status: DC

## 2018-04-05 MED ORDER — IOPAMIDOL (ISOVUE-M 200) INJECTION 41%
10.0000 mL | Freq: Once | INTRAMUSCULAR | Status: AC
  Administered 2018-04-05: 10 mL via INTRA_ARTICULAR
  Filled 2018-04-05: qty 10

## 2018-04-05 MED ORDER — MIDAZOLAM HCL 5 MG/5ML IJ SOLN
1.0000 mg | INTRAMUSCULAR | Status: DC | PRN
  Administered 2018-04-05: 1 mg via INTRAVENOUS
  Filled 2018-04-05: qty 5

## 2018-04-05 MED ORDER — METHYLPREDNISOLONE ACETATE 80 MG/ML IJ SUSP
80.0000 mg | Freq: Once | INTRAMUSCULAR | Status: AC
  Administered 2018-04-05: 80 mg via INTRA_ARTICULAR
  Filled 2018-04-05: qty 1

## 2018-04-05 MED ORDER — LIDOCAINE HCL 2 % IJ SOLN
20.0000 mL | Freq: Once | INTRAMUSCULAR | Status: AC
  Administered 2018-04-05: 400 mg
  Filled 2018-04-05: qty 40

## 2018-04-05 MED ORDER — FENTANYL CITRATE (PF) 100 MCG/2ML IJ SOLN
25.0000 ug | INTRAMUSCULAR | Status: DC | PRN
  Administered 2018-04-05: 50 ug via INTRAVENOUS
  Filled 2018-04-05: qty 2

## 2018-04-05 NOTE — Progress Notes (Signed)
Patient's Name: Amber Barnes  MRN: [id number]  Referring Provider: [name], FNP  DOB: 1950/07/26  PCP: [name], FNP  DOS: 04/05/2018  Note by: Gaspar Cola, MD  Service setting: Ambulatory outpatient  Specialty: Interventional Pain Management  Patient type: Established  Location: [facility] (AMB) Pain Management Facility  Visit type: Interventional Procedure   Primary Reason for Visit: Interventional Pain Management Treatment. CC: Hip Pain (right)  Procedure:          Anesthesia, Analgesia, Anxiolysis:  Type: Intra-Articular Hip Injection #1  Primary Purpose: Diagnostic Region: Posterolateral hip joint area. Level: Lower pelvic and hip joint level. Target Area: Superior aspect of the hip joint cavity, going thru the superior portion of the capsular ligament. Approach: Posterolateral approach. Laterality: Right-Sided  Type: Moderate (Conscious) Sedation combined with Local Anesthesia Indication(s): Analgesia and Anxiety Route: Intravenous (IV) IV Access: Secured Sedation: Meaningful verbal contact was maintained at all times during the procedure  Local Anesthetic: Lidocaine 1-2%  Position: Lateral Decubitus with bad side up Prepped Area: Entire Posterolateral hip area. Prepping solution: ChloraPrep (2% chlorhexidine gluconate and 70% isopropyl alcohol)   Indications: 1. Osteoarthritis of hip (Right)   2. Chronic hip pain (Tertiary Area of Pain) (Right)    Pain Score: Pre-procedure: 10-Worst pain ever/10 Post-procedure: 0-No pain/10  Pre-op Assessment:  Amber Barnes is a 67 y.o. (year old), female patient, seen today for interventional treatment. She  has a past surgical history that includes Thyroidectomy; Colonoscopy; Esophagogastroduodenoscopy; Abdominal hysterectomy; Achilles tendon surgery; Back surgery; CARDIOVERSION (N/A, 09/07/2016); CARDIOVERSION (N/A, 10/19/2016); and CARDIOVERSION (N/A, 12/14/2016). Amber Barnes has a current medication list which includes  the following prescription(s): amiodarone, b complex vitamins, bisoprolol, calcium gluconate, vitamin d3, cyclobenzaprine, eliquis, esomeprazole, fluvoxamine, furosemide, gabapentin, hydroxyzine, klor-con 10, losartan, multivitamin, oxymetazoline, ranolazine, rizatriptan, rosuvastatin, trazodone, tramadol, and tramadol, and the following Facility-Administered Medications: fentanyl, lactated ringers, and midazolam. Her primarily concern today is the Hip Pain (right)  Initial Vital Signs:  Pulse/HCG Rate: (!) 56  Temp: 98.1 F (36.7 C) Resp: 18 BP: (!) 155/68 SpO2: 94 %  BMI: Estimated body mass index is 53.25 kg/m as calculated from the following:   Height as of this encounter: 5\' 5"  (1.651 m).   Weight as of this encounter: 320 lb (145.2 kg).  Risk Assessment: Allergies: Reviewed. She is allergic to prednisone; hydrocodone-acetaminophen; oxycodone; tramadol; adhesive [tape]; and latex.  Allergy Precautions: Latex-free protocol activated Coagulopathies: Reviewed. None identified.  Blood-thinner therapy: None at this time Active Infection(s): Reviewed. None identified. Amber Barnes is afebrile  Site Confirmation: Amber Barnes was asked to confirm the procedure and laterality before marking the site Procedure checklist: Completed Consent: Before the procedure and under the influence of no sedative(s), amnesic(s), or anxiolytics, the patient was informed of the treatment options, risks and possible complications. To fulfill our ethical and legal obligations, as recommended by the American Medical Association's Code of Ethics, I have informed the patient of my clinical impression; the nature and purpose of the treatment or procedure; the risks, benefits, and possible complications of the intervention; the alternatives, including doing nothing; the risk(s) and benefit(s) of the alternative treatment(s) or procedure(s); and the risk(s) and benefit(s) of doing nothing. The patient was provided information  about the general risks and possible complications associated with the procedure. These may include, but are not limited to: failure to achieve desired goals, infection, bleeding, organ or nerve damage, allergic reactions, paralysis, and death. In addition, the patient was informed of those risks and complications associated to  the procedure, such as failure to decrease pain; infection; bleeding; organ or nerve damage with subsequent damage to sensory, motor, and/or autonomic systems, resulting in permanent pain, numbness, and/or weakness of one or several areas of the body; allergic reactions; (i.e.: anaphylactic reaction); and/or death. Furthermore, the patient was informed of those risks and complications associated with the medications. These include, but are not limited to: allergic reactions (i.e.: anaphylactic or anaphylactoid reaction(s)); adrenal axis suppression; blood sugar elevation that in diabetics may result in ketoacidosis or comma; water retention that in patients with history of congestive heart failure may result in shortness of breath, pulmonary edema, and decompensation with resultant heart failure; weight gain; swelling or edema; medication-induced neural toxicity; particulate matter embolism and blood vessel occlusion with resultant organ, and/or nervous system infarction; and/or aseptic necrosis of one or more joints. Finally, the patient was informed that Medicine is not an exact science; therefore, there is also the possibility of unforeseen or unpredictable risks and/or possible complications that may result in a catastrophic outcome. The patient indicated having understood very clearly. We have given the patient no guarantees and we have made no promises. Enough time was given to the patient to ask questions, all of which were answered to the patient's satisfaction. Amber Barnes has indicated that she wanted to continue with the procedure. Attestation: I, the ordering provider, attest  that I have discussed with the patient the benefits, risks, side-effects, alternatives, likelihood of achieving goals, and potential problems during recovery for the procedure that I have provided informed consent. Date  Time: 04/05/2018  9:58 AM  Pre-Procedure Preparation:  Monitoring: As per clinic protocol. Respiration, ETCO2, SpO2, BP, heart rate and rhythm monitor placed and checked for adequate function Safety Precautions: Patient was assessed for positional comfort and pressure points before starting the procedure. Time-out: I initiated and conducted the "Time-out" before starting the procedure, as per protocol. The patient was asked to participate by confirming the accuracy of the "Time Out" information. Verification of the correct person, site, and procedure were performed and confirmed by me, the nursing staff, and the patient. "Time-out" conducted as per Joint Commission's Universal Protocol (UP.01.01.01). Time: 1030  Description of Procedure:          Safety Precautions: Aspiration looking for blood return was conducted prior to all injections. At no point did we inject any substances, as a needle was being advanced. No attempts were made at seeking any paresthesias. Safe injection practices and needle disposal techniques used. Medications properly checked for expiration dates. SDV (single dose vial) medications used. Description of the Procedure: Protocol guidelines were followed. The patient was placed in position over the fluoroscopy table. The target area was identified and the area prepped in the usual manner. Skin & deeper tissues infiltrated with local anesthetic. Appropriate amount of time allowed to pass for local anesthetics to take effect. The procedure needles were then advanced to the target area. Proper needle placement secured. Negative aspiration confirmed. Solution injected in intermittent fashion, asking for systemic symptoms every 0.5cc of injectate. The needles were then  removed and the area cleansed, making sure to leave some of the prepping solution back to take advantage of its long term bactericidal properties. Vitals:   04/05/18 1035 04/05/18 1045 04/05/18 1055 04/05/18 1105  BP: (!) 152/62 (!) 192/85 (!) 192/87 (!) 191/88  Pulse: (!) 55 (!) 54 (!) 52 (!) 52  Resp: 16 14 14 16   Temp:  98.2 F (36.8 C)  (!) 97.4 F (36.3 C)  SpO2: 97% 94% 94% 94%  Weight:      Height:        Start Time: 1030 hrs. End Time: 1035 hrs. Materials:  Needle(s) Type: Spinal Needle Gauge: 22G Length: 7-in Medication(s): Please see orders for medications and dosing details. Imaging Guidance (Non-Spinal):          Type of Imaging Technique: Fluoroscopy Guidance (Non-Spinal) Indication(s): Assistance in needle guidance and placement for procedures requiring needle placement in or near specific anatomical locations not easily accessible without such assistance. Exposure Time: Please see nurses notes. Contrast: Before injecting any contrast, we confirmed that the patient did not have an allergy to iodine, shellfish, or radiological contrast. Once satisfactory needle placement was completed at the desired level, radiological contrast was injected. Contrast injected under live fluoroscopy. No contrast complications. See chart for type and volume of contrast used. Fluoroscopic Guidance: I was personally present during the use of fluoroscopy. "Tunnel Vision Technique" used to obtain the best possible view of the target area. Parallax error corrected before commencing the procedure. "Direction-depth-direction" technique used to introduce the needle under continuous pulsed fluoroscopy. Once target was reached, antero-posterior, oblique, and lateral fluoroscopic projection used confirm needle placement in all planes. Images permanently stored in EMR. Interpretation: I personally interpreted the imaging intraoperatively. Adequate needle placement confirmed in multiple planes. Appropriate  spread of contrast into desired area was observed. No evidence of afferent or efferent intravascular uptake. Permanent images saved into the patient's record.  Antibiotic Prophylaxis:   Anti-infectives (From admission, onward)   None     Indication(s): None identified  Post-operative Assessment:  Post-procedure Vital Signs:  Pulse/HCG Rate: (!) 52  Temp: (!) 97.4 F (36.3 C) Resp: 16 BP: (!) 191/88 SpO2: 94 %  EBL: None  Complications: No immediate post-treatment complications observed by team, or reported by patient.  Note: The patient tolerated the entire procedure well. A repeat set of vitals were taken after the procedure and the patient was kept under observation following institutional policy, for this type of procedure. Post-procedural neurological assessment was performed, showing return to baseline, prior to discharge. The patient was provided with post-procedure discharge instructions, including a section on how to identify potential problems. Should any problems arise concerning this procedure, the patient was given instructions to immediately contact us, at any time, without hesitation. In any case, we plan to contact the patient by telephone for a follow-up status report regarding this interventional procedure.  Comments:  No additional relevant information.  Plan of Care    Imaging Orders     DG C-Arm 1-60 Min-No Report  Procedure Orders     HIP INJECTION  Medications ordered for procedure: Meds ordered this encounter  Medications  . lidocaine (XYLOCAINE) 2 % (with pres) injection 400 mg  . midazolam (VERSED) 5 MG/5ML injection 1-2 mg    Make sure Flumazenil is available in the pyxis when using this medication. If oversedation occurs, administer 0.2 mg IV over 15 sec. If after 45 sec no response, administer 0.2 mg again over 1 min; may repeat at 1 min intervals; not to exceed 4 doses (1 mg)  . fentaNYL (SUBLIMAZE) injection 25-50 mcg    Make sure Narcan is  available in the pyxis when using this medication. In the event of respiratory depression (RR< 8/min): Titrate NARCAN (naloxone) in increments of 0.1 to 0.2 mg IV at 2-3 minute intervals, until desired degree of reversal.  . lactated ringers infusion 1,000 mL  . ropivacaine (PF) 2 mg/mL (0.2%) (NAROPIN)  injection 4 mL  . methylPREDNISolone acetate (DEPO-MEDROL) injection 80 mg  . iopamidol (ISOVUE-M) 41 % intrathecal injection 10 mL   Medications administered: We administered lidocaine, midazolam, fentaNYL, ropivacaine (PF) 2 mg/mL (0.2%), methylPREDNISolone acetate, and iopamidol.  See the medical record for exact dosing, route, and time of administration.  New Prescriptions   No medications on file   Disposition: Discharge home  Discharge Date & Time: 04/05/2018; 1115 hrs.   Physician-requested Follow-up: Return for post-procedure eval (2 wks), w/ Dr. Dossie Arbour.  Future Appointments  Date Time Provider Lucien  04/23/2018 11:45 AM Milinda Pointer, MD [facility] None   Primary Care Physician: [name], FNP Location: [facility] Note by: Gaspar Cola, MD Date: 04/05/2018; Time: 11:34 AM  Disclaimer:  Medicine is not an exact science. The only guarantee in medicine is that nothing is guaranteed. It is important to note that the decision to proceed with this intervention was based on the information collected from the patient. The Data and conclusions were drawn from the patient's questionnaire, the interview, and the physical examination. Because the information was provided in large part by the patient, it cannot be guaranteed that it has not been purposely or unconsciously manipulated. Every effort has been made to obtain as much relevant data as possible for this evaluation. It is important to note that the conclusions that lead to this procedure are derived in large part from the available data. Always take into account that the  treatment will also be dependent on availability of resources and existing treatment guidelines, considered by other Pain Management Practitioners as being common knowledge and practice, at the time of the intervention. For Medico-Legal purposes, it is also important to point out that variation in procedural techniques and pharmacological choices are the acceptable norm. The indications, contraindications, technique, and results of the above procedure should only be interpreted and judged by a Board-Certified Interventional Pain Specialist with extensive familiarity and expertise in the same exact procedure and technique.

## 2018-04-05 NOTE — Patient Instructions (Addendum)
____________________________________________________________________________________________  Post-Procedure Discharge Instructions  Instructions:  Apply ice: Fill a plastic sandwich bag with crushed ice. Cover it with a small towel and apply to injection site. Apply for 15 minutes then remove x 15 minutes. Repeat sequence on day of procedure, until you go to bed. The purpose is to minimize swelling and discomfort after procedure.  Apply heat: Apply heat to procedure site starting the day following the procedure. The purpose is to treat any soreness and discomfort from the procedure.  Food intake: Start with clear liquids (like water) and advance to regular food, as tolerated.   Physical activities: Keep activities to a minimum for the first 8 hours after the procedure.   Driving: If you have received any sedation, you are not allowed to drive for 24 hours after your procedure.  Blood thinner: Restart your blood thinner 6 hours after your procedure. (Only for those taking blood thinners)  Insulin: As soon as you can eat, you may resume your normal dosing schedule. (Only for those taking insulin)  Infection prevention: Keep procedure site clean and dry.  Post-procedure Pain Diary: Extremely important that this be done correctly and accurately. Recorded information will be used to determine the next step in treatment.  Pain evaluated is that of treated area only. Do not include pain from an untreated area.  Complete every hour, on the hour, for the initial 8 hours. Set an alarm to help you do this part accurately.  Do not go to sleep and have it completed later. It will not be accurate.  Follow-up appointment: Keep your follow-up appointment after the procedure. Usually 2 weeks for most procedures. (6 weeks in the case of radiofrequency.) Bring you pain diary.   Expect:  From numbing medicine (AKA: Local Anesthetics): Numbness or decrease in pain.  Onset: Full effect within 15  minutes of injected.  Duration: It will depend on the type of local anesthetic used. On the average, 1 to 8 hours.   From steroids: Decrease in swelling or inflammation. Once inflammation is improved, relief of the pain will follow.  Onset of benefits: Depends on the amount of swelling present. The more swelling, the longer it will take for the benefits to be seen. In some cases, up to 10 days.  Duration: Steroids will stay in the system x 2 weeks. Duration of benefits will depend on multiple posibilities including persistent irritating factors.  Occasional side-effects: Facial flushing, cramps (if present, drink Gatorade and take over-the-counter Magnesium 450-500 mg once to twice a day).  From procedure: Some discomfort is to be expected once the numbing medicine wears off. This should be minimal if ice and heat are applied as instructed.  Call if:  You experience numbness and weakness that gets worse with time, as opposed to wearing off.  New onset bowel or bladder incontinence. (This applies to Spinal procedures only)  Emergency Numbers:  Durning business hours (Monday - Thursday, 8:00 AM - 4:00 PM) (Friday, 9:00 AM - 12:00 Noon): [phone]  After hours: [phone] ____________________________________________________________________________________________   ____________________________________________________________________________________________  Pain Scale  Introduction: The pain score used by this practice is the Verbal Numerical Rating Scale (VNRS-11). This is an 11-point scale. It is for adults and children 10 years or older. There are significant differences in how the pain score is reported, used, and applied. Forget everything you learned in the past and learn this scoring system.  General Information: The scale should reflect your current level of pain. Unless you are specifically asked   for the level of your worst pain, or your average pain. If you are asked  for one of these two, then it should be understood that it is over the past 24 hours.  Basic Activities of Daily Living (ADL): Personal hygiene, dressing, eating, transferring, and using restroom.  Instructions: Most patients tend to report their level of pain as a combination of two factors, their physical pain and their psychosocial pain. This last one is also known as "suffering" and it is reflection of how physical pain affects you socially and psychologically. From now on, report them separately. From this point on, when asked to report your pain level, report only your physical pain. Use the following table for reference.  Pain Clinic Pain Levels (0-5/10)  Pain Level Score  Description  No Pain 0   Mild pain 1 Nagging, annoying, but does not interfere with basic activities of daily living (ADL). Patients are able to eat, bathe, get dressed, toileting (being able to get on and off the toilet and perform personal hygiene functions), transfer (move in and out of bed or a chair without assistance), and maintain continence (able to control bladder and bowel functions). Blood pressure and heart rate are unaffected. A normal heart rate for a healthy adult ranges from 60 to 100 bpm (beats per minute).   Mild to moderate pain 2 Noticeable and distracting. Impossible to hide from other people. More frequent flare-ups. Still possible to adapt and function close to normal. It can be very annoying and may have occasional stronger flare-ups. With discipline, patients may get used to it and adapt.   Moderate pain 3 Interferes significantly with activities of daily living (ADL). It becomes difficult to feed, bathe, get dressed, get on and off the toilet or to perform personal hygiene functions. Difficult to get in and out of bed or a chair without assistance. Very distracting. With effort, it can be ignored when deeply involved in activities.   Moderately severe pain 4 Impossible to ignore for more than a few  minutes. With effort, patients may still be able to manage work or participate in some social activities. Very difficult to concentrate. Signs of autonomic nervous system discharge are evident: dilated pupils (mydriasis); mild sweating (diaphoresis); sleep interference. Heart rate becomes elevated (>115 bpm). Diastolic blood pressure (lower number) rises above 100 mmHg. Patients find relief in laying down and not moving.   Severe pain 5 Intense and extremely unpleasant. Associated with frowning face and frequent crying. Pain overwhelms the senses.  Ability to do any activity or maintain social relationships becomes significantly limited. Conversation becomes difficult. Pacing back and forth is common, as getting into a comfortable position is nearly impossible. Pain wakes you up from deep sleep. Physical signs will be obvious: pupillary dilation; increased sweating; goosebumps; brisk reflexes; cold, clammy hands and feet; nausea, vomiting or dry heaves; loss of appetite; significant sleep disturbance with inability to fall asleep or to remain asleep. When persistent, significant weight loss is observed due to the complete loss of appetite and sleep deprivation.  Blood pressure and heart rate becomes significantly elevated. Caution: If elevated blood pressure triggers a pounding headache associated with blurred vision, then the patient should immediately seek attention at an urgent or emergency care unit, as these may be signs of an impending stroke.    Emergency Department Pain Levels (6-10/10)  Emergency Room Pain 6 Severely limiting. Requires emergency care and should not be seen or managed at an outpatient pain management facility. Communication becomes difficult   and requires great effort. Assistance to reach the emergency department may be required. Facial flushing and profuse sweating along with potentially dangerous increases in heart rate and blood pressure will be evident.   Distressing pain 7  Self-care is very difficult. Assistance is required to transport, or use restroom. Assistance to reach the emergency department will be required. Tasks requiring coordination, such as bathing and getting dressed become very difficult.   Disabling pain 8 Self-care is no longer possible. At this level, pain is disabling. The individual is unable to do even the most "basic" activities such as walking, eating, bathing, dressing, transferring to a bed, or toileting. Fine motor skills are lost. It is difficult to think clearly.   Incapacitating pain 9 Pain becomes incapacitating. Thought processing is no longer possible. Difficult to remember your own name. Control of movement and coordination are lost.   The worst pain imaginable 10 At this level, most patients pass out from pain. When this level is reached, collapse of the autonomic nervous system occurs, leading to a sudden drop in blood pressure and heart rate. This in turn results in a temporary and dramatic drop in blood flow to the brain, leading to a loss of consciousness. Fainting is one of the body's self defense mechanisms. Passing out puts the brain in a calmed state and causes it to shut down for a while, in order to begin the healing process.    Summary: 1. Refer to this scale when providing us with your pain level. 2. Be accurate and careful when reporting your pain level. This will help with your care. 3. Over-reporting your pain level will lead to loss of credibility. 4. Even a level of 1/10 means that there is pain and will be treated at our facility. 5. High, inaccurate reporting will be documented as "Symptom Exaggeration", leading to loss of credibility and suspicions of possible secondary gains such as obtaining more narcotics, or wanting to appear disabled, for fraudulent reasons. 6. Only pain levels of 5 or below will be seen at our facility. 7. Pain levels of 6 and above will be sent to the Emergency Department and the appointment  cancelled. ____________________________________________________________________________________________    

## 2018-04-05 NOTE — Telephone Encounter (Signed)
° °  [facility] Pre-operative Risk Assessment    Request for surgical clearance:  1. What type of surgery is being performed? Colonoscopy   2. When is this surgery scheduled? 04/25/18   3. What type of clearance is required (medical clearance vs. Pharmacy clearance to hold med vs. Both)? Pharmacy   4. Are there any medications that need to be held prior to surgery and how long? Eliquis   5. Practice name and name of physician performing surgery? [facility]   6. What is your office phone number [phone]   7.   What is your office fax number [phone]   8.   Anesthesia type (None, local, MAC, general) ?

## 2018-04-05 NOTE — Progress Notes (Signed)
Safety precautions to be maintained throughout the outpatient stay will include: orient to surroundings, keep bed in low position, maintain call bell within reach at all times, provide assistance with transfer out of bed and ambulation.  

## 2018-04-06 ENCOUNTER — Telehealth: Payer: Self-pay

## 2018-04-06 NOTE — Telephone Encounter (Signed)
Pt takes Eliquis for afib with CHADS2VASc score of 3 (age, sex, HTN). Renal function is normal. Ok to hold Eliquis for 1-2 days prior to procedure.

## 2018-04-06 NOTE — Telephone Encounter (Signed)
Clearance faxed to [facility]: [phone]

## 2018-04-06 NOTE — Telephone Encounter (Signed)
Post procedure phone call.  States she is doing well.

## 2018-04-06 NOTE — Telephone Encounter (Signed)
Routing to pharmacy pool for anticoagulation recommendations.

## 2018-04-08 ENCOUNTER — Emergency Department: Payer: Medicare Other

## 2018-04-08 ENCOUNTER — Encounter: Payer: Self-pay | Admitting: Emergency Medicine

## 2018-04-08 ENCOUNTER — Emergency Department
Admission: EM | Admit: 2018-04-08 | Discharge: 2018-04-08 | Disposition: A | Discharge status: Home / Self Care | Payer: Medicare Other | Attending: Emergency Medicine | Admitting: Emergency Medicine

## 2018-04-08 ENCOUNTER — Other Ambulatory Visit: Payer: Self-pay | Admitting: Cardiovascular Disease

## 2018-04-08 DIAGNOSIS — Z91040 Latex allergy status: Secondary | ICD-10-CM | POA: Insufficient documentation

## 2018-04-08 DIAGNOSIS — I110 Hypertensive heart disease with heart failure: Secondary | ICD-10-CM | POA: Insufficient documentation

## 2018-04-08 DIAGNOSIS — Z87891 Personal history of nicotine dependence: Secondary | ICD-10-CM | POA: Insufficient documentation

## 2018-04-08 DIAGNOSIS — I4891 Unspecified atrial fibrillation: Secondary | ICD-10-CM | POA: Diagnosis not present

## 2018-04-08 DIAGNOSIS — R079 Chest pain, unspecified: Secondary | ICD-10-CM

## 2018-04-08 DIAGNOSIS — Z7901 Long term (current) use of anticoagulants: Secondary | ICD-10-CM | POA: Diagnosis not present

## 2018-04-08 DIAGNOSIS — E785 Hyperlipidemia, unspecified: Secondary | ICD-10-CM | POA: Insufficient documentation

## 2018-04-08 DIAGNOSIS — J449 Chronic obstructive pulmonary disease, unspecified: Secondary | ICD-10-CM | POA: Insufficient documentation

## 2018-04-08 DIAGNOSIS — Z79899 Other long term (current) drug therapy: Secondary | ICD-10-CM | POA: Insufficient documentation

## 2018-04-08 DIAGNOSIS — I509 Heart failure, unspecified: Secondary | ICD-10-CM | POA: Diagnosis not present

## 2018-04-08 LAB — CBC WITH DIFFERENTIAL/PLATELET
Basophils Absolute: 0.1 10*3/uL (ref 0–0.1)
Basophils Relative: 1 %
Eosinophils Absolute: 0 10*3/uL (ref 0–0.7)
Eosinophils Relative: 1 %
HCT: 44.3 % (ref 35.0–47.0)
Hemoglobin: 15.2 g/dL (ref 12.0–16.0)
Lymphocytes Relative: 30 %
Lymphs Abs: 2.3 10*3/uL (ref 1.0–3.6)
MCH: 31.1 pg (ref 26.0–34.0)
MCHC: 34.2 g/dL (ref 32.0–36.0)
MCV: 90.8 fL (ref 80.0–100.0)
Monocytes Absolute: 0.6 10*3/uL (ref 0.2–0.9)
Monocytes Relative: 8 %
Neutro Abs: 4.6 10*3/uL (ref 1.4–6.5)
Neutrophils Relative %: 60 %
Platelets: 179 10*3/uL (ref 150–440)
RBC: 4.88 MIL/uL (ref 3.80–5.20)
RDW: 14.2 % (ref 11.5–14.5)
WBC: 7.7 10*3/uL (ref 3.6–11.0)

## 2018-04-08 LAB — COMPREHENSIVE METABOLIC PANEL
ALT: 28 U/L (ref 0–44)
AST: 28 U/L (ref 15–41)
Albumin: 4.3 g/dL (ref 3.5–5.0)
Alkaline Phosphatase: 69 U/L (ref 38–126)
Anion gap: 10 (ref 5–15)
BUN: 24 mg/dL — ABNORMAL HIGH (ref 8–23)
CO2: 28 mmol/L (ref 22–32)
Calcium: 8.8 mg/dL — ABNORMAL LOW (ref 8.9–10.3)
Chloride: 102 mmol/L (ref 98–111)
Creatinine, Ser: 1.03 mg/dL — ABNORMAL HIGH (ref 0.44–1.00)
GFR calc Af Amer: >60 mL/min (ref >60)
GFR calc non Af Amer: 56 mL/min — ABNORMAL LOW (ref >60)
Glucose, Bld: 84 mg/dL (ref 70–99)
Potassium: 4.4 mmol/L (ref 3.5–5.1)
Sodium: 140 mmol/L (ref 135–145)
Total Bilirubin: 1 mg/dL (ref 0.3–1.2)
Total Protein: 7.5 g/dL (ref 6.5–8.1)

## 2018-04-08 LAB — TROPONIN I
Troponin I: <0.03 ng/mL (ref <0.03)
Troponin I: <0.03 ng/mL (ref <0.03)

## 2018-04-08 NOTE — ED Notes (Signed)
Pt c/o  CP 2/10 started las night; pt stated she woke up with "night sweats" . Pt went to Urgent Care this morning; Pt states " I was feeling dizzy and not feeling well, so I went to urgent care this morning". NAD noted.

## 2018-04-08 NOTE — Discharge Instructions (Signed)
Please return for any further symptoms especially if you get the chest pressure or your blood pressure goes way up again.  Please call Dr. [name] first thing in the morning tomorrow and let him know what happened.  He tell him the ER doctor, Dr. Rip Harbour, wanted him to see you as soon as possible.

## 2018-04-08 NOTE — ED Provider Notes (Signed)
[facility] Emergency Department Provider Note   ____________________________________________   First MD Initiated Contact with Patient 04/08/18 1030     (approximate)  I have reviewed the triage vital signs and the nursing notes.   HISTORY  Chief Complaint Chest Pain    HPI Amber Barnes is a 67 y.o. female patient reports chest pain starting last night.  At times it was very brief pressure lasting seconds and resolving woke her up several times during the night this morning it was present still.  It was constant this morning.  Patient went to urgent care and was sent here.  At urgent care she got aspirin.  She was found to have a blood pressure of 231/107.  EMS gave her 2 inches of Nitropaste.  Here the pain is gone from a 5 down to a 2.  But she denied any shortness of breath, sweating, nausea or radiation of the pain.  She reports nothing made the pain better or worse.  Specifically not worse with walking or exercise or deep breathing.   Past Medical History:  Diagnosis Date  . Achilles tendinitis   . Acute medial meniscal tear   . Allergy   . Anxiety   . Arthritis   . Atrial fibrillation ([place])    a. new onset 07/2016; b. CHADS2VASc --> 2 (HTN, female); c. eliquis  . CHF (congestive heart failure) ([place])   . Chronic lower back pain   . COPD (chronic obstructive pulmonary disease) ([place])   . Depression   . Fibrocystic breast disease   . GERD (gastroesophageal reflux disease)   . Hyperlipidemia   . Hypertension   . Insomnia   . Lipoma   . Migraine   . Osteoarthritis    left knee  . Osteopenia   . Tendonitis   . Thyroid disease   . Vitamin D deficiency     Patient Active Problem List   Diagnosis Date Noted  . Osteoarthritis of hip (Right) 04/05/2018  . History of allergy to latex 04/05/2018  . Spondylosis without myelopathy or radiculopathy, lumbosacral region 03/27/2018  . Other specified dorsopathies, sacral and sacrococcygeal region  03/27/2018  . Lumbar facet joint syndrome (Right) 03/21/2018  . Chronic sacroiliac joint pain (Left) 03/21/2018  . Chronic anticoagulation (Eliquis) 03/21/2018  . Chronic groin pain (Primary Area of Pain) (Right) 02/27/2018  . Chronic lower extremity pain (Secondary Area of Pain) (Right) 02/27/2018  . Chronic hip pain [facility] Area of Pain) (Right) 02/27/2018  . Chronic low back pain (Fourth Area of Pain) (Bilateral) w/ sciatica (Right) 02/27/2018  . Chronic knee pain (Bilateral) 02/27/2018  . Chronic pain syndrome 02/27/2018  . Pharmacologic therapy 02/27/2018  . Disorder of skeletal system 02/27/2018  . Problems influencing health status 02/27/2018  . Screening mammogram, encounter for 02/02/2018  . Numbness and tingling 12/08/2017  . Olecranon bursitis of left elbow 11/14/2017  . Cervical radiculopathy 11/14/2017  . Atherosclerosis of aorta ([place]) 08/25/2017  . Migraine with aura and without status migrainosus, not intractable 06/27/2017  . Bradycardia 05/22/2017  . Depression, recurrent ([place]) 05/22/2017  . Restless leg 04/13/2017  . OSA (obstructive sleep apnea) 01/09/2017  . Acute pain of left shoulder 11/28/2016  . Persistent atrial fibrillation ([name]) 08/11/2016  . Acute pulmonary edema (H[facility])   . New onset a-fib ([place]) 07/28/2016  . Hypokalemia 07/28/2016  . Left foot pain 07/28/2016  . Atypical chest pain   . Fatty liver 06/30/2016  . Dizziness 06/08/2016  . Family history of ovarian  cancer 06/08/2016  . GERD (gastroesophageal reflux disease) 05/05/2016  . Seasonal rhinitis 05/05/2016  . Routine physical examination 05/05/2016  . Heel spur 11/05/2015  . Tendonitis 11/05/2015  . SOB (shortness of breath) 11/11/2014  . Morbid obesity ([place]) 11/11/2014  . Mixed hyperlipidemia 11/11/2014  . Essential hypertension 11/11/2014  . Anxiety and depression 11/11/2014  . Arthritis, senescent 11/11/2014  . Weight loss due to medication 11/11/2014  . Obesity, Class III, BMI  40-49.9 (morbid obesity) ([place]) 04/11/2013    Past Surgical History:  Procedure Laterality Date  . ABDOMINAL HYSTERECTOMY     ovaries left  . ACHILLES TENDON SURGERY     2012,01/2017  . BACK SURGERY    . CARDIOVERSION N/A 09/07/2016   Procedure: CARDIOVERSION;  Surgeon: Minna Merritts, MD;  Location: [facility] ORS;  Service: Cardiovascular;  Laterality: N/A;  . CARDIOVERSION N/A 10/19/2016   Procedure: CARDIOVERSION;  Surgeon: Minna Merritts, MD;  Location: [facility] ORS;  Service: Cardiovascular;  Laterality: N/A;  . CARDIOVERSION N/A 12/14/2016   Procedure: CARDIOVERSION;  Surgeon: Minna Merritts, MD;  Location: [facility] ORS;  Service: Cardiovascular;  Laterality: N/A;  . COLONOSCOPY    . ESOPHAGOGASTRODUODENOSCOPY    . THYROIDECTOMY     thyroid lobectomy secondary to a benign nodule    Prior to Admission medications   Medication Sig Start Date End Date Taking? Authorizing Provider  amiodarone (PACERONE) 200 MG tablet Take 1 tablet (200 mg total) by mouth daily. 07/24/17   Minna Merritts, MD  B Complex Vitamins (VITAMIN B COMPLEX PO) Take 1 tablet by mouth daily.     [provider]  bisoprolol (ZEBETA) 5 MG tablet Take 1 tablet (5 mg total) by mouth daily. 07/24/17   Minna Merritts, MD  calcium gluconate 500 MG tablet Take 500 tablets by mouth daily.    [provider]  Cholecalciferol (VITAMIN D3) 5000 units CAPS Take 5,000 Units by mouth daily.    [provider]  cyclobenzaprine (FLEXERIL) 5 MG tablet TAKE 1 TABLET BY MOUTH THREE TIMES A DAY 04/03/18   [name], FNP  ELIQUIS 5 MG TABS tablet TAKE 1 TABLET BY MOUTH TWICE A DAY 11/29/17   Minna Merritts, MD  esomeprazole (NEXIUM) 20 MG capsule Take 20 mg by mouth daily as needed (acid reflux).    [provider]  fluvoxaMINE (LUVOX) 50 MG tablet TAKE 2.5 TABLETS (125 MG TOTAL) BY MOUTH AT BEDTIME. 03/19/18   Eappen, Ria Clock, MD  furosemide (LASIX) 20 MG tablet TAKE 1 TABLET (20 MG TOTAL) BY  MOUTH 2 (TWO) TIMES DAILY. 10/13/17   Minna Merritts, MD  gabapentin (NEURONTIN) 400 MG capsule Take 1 capsule (400 mg total) by mouth 2 (two) times daily. 11/14/17   McLean-Scocuzza, Nino Glow, MD  hydrOXYzine (ATARAX/VISTARIL) 10 MG tablet Take 2 tablets (20 mg total) by mouth 2 (two) times daily as needed. 02/12/18   Ursula Alert, MD  KLOR-CON 10 10 MEQ tablet TAKE 1 TABLET (10 MEQ TOTAL) BY MOUTH 2 (TWO) TIMES DAILY. 02/28/18   Minna Merritts, MD  losartan (COZAAR) 25 MG tablet TAKE 1 TABLET BY MOUTH EVERY DAY 01/22/18   [name], FNP  Multiple Vitamin (MULTIVITAMIN) tablet Take 1 tablet by mouth daily.    [provider]  oxymetazoline (AFRIN) 0.05 % nasal spray Place 1 spray into both nostrils at bedtime as needed for congestion.     [provider]  ranolazine (RANEXA) 500 MG 12 hr tablet TAKE 1  TABLET BY MOUTH TWICE A DAY 01/01/18   Minna Merritts, MD  rizatriptan (MAXALT) 10 MG tablet TAKE 1 TABLET (10 MG TOTAL) BY MOUTH AS NEEDED FOR MIGRAINE. MAY REPEAT IN 2 HOURS IF NEEDED 03/13/18   Melvenia Beam, MD  rosuvastatin (CRESTOR) 20 MG tablet TAKE 1 TABLET BY MOUTH EVERY DAY 01/22/18   [name], FNP  traMADol (ULTRAM) 50 MG tablet Take 1-2 tablets (50-100 mg total) by mouth every 6 (six) hours as needed for up to 7 days for severe pain. 03/21/18 03/28/18  Milinda Pointer, MD  traMADol (ULTRAM) 50 MG tablet Take 1-2 tablets (50-100 mg total) by mouth every 6 (six) hours as needed for severe pain. Patient not taking: Reported on 04/05/2018 03/28/18 04/27/18  Milinda Pointer, MD  traZODone (DESYREL) 50 MG tablet TAKE 0.5-1 TABLETS (25-50 MG TOTAL) BY MOUTH AT BEDTIME. SLEEP 03/05/18   Ursula Alert, MD    Allergies Prednisone; Hydrocodone-acetaminophen; Oxycodone; Tramadol; Adhesive [tape]; and Latex  Family History  Problem Relation Age of Onset  . Cancer Sister 72       ovarian  . Depression Sister   . Cancer Father 1       colon  . Heart  disease Father   . Heart attack Father 43       died from MI  . Hyperlipidemia Father   . Hypertension Father   . Cancer Mother        leukemia  . Breast cancer Paternal Aunt        63's  . Migraines Neg Hx     Social History Social History   Tobacco Use  . Smoking status: Former Smoker    Packs/day: 0.25    Years: 10.00    Pack years: 2.50    Types: Cigarettes    Last attempt to quit: 2005    Years since quitting: 14.7  . Smokeless tobacco: Former Systems developer    Quit date: 07/11/2001  . Tobacco comment: smoked for 10 years, 1 pack every 2-3 days  Substance Use Topics  . Alcohol use: Not Currently    Comment: social, occasional  . Drug use: No    Review of Systems  Constitutional: No fever/chills Eyes: No visual changes. ENT: No sore throat. Cardiovascular: chest pain. Respiratory: Denies shortness of breath. Gastrointestinal: No abdominal pain.  No nausea, no vomiting.  No diarrhea.  No constipation. Genitourinary: Negative for dysuria. Musculoskeletal: Negative for back pain. Skin: Negative for rash. Neurological: Negative for headaches, focal weakness   ____________________________________________   PHYSICAL EXAM:  VITAL SIGNS: ED Triage Vitals  Enc Vitals Group     BP 04/08/18 1028 (!) 209/84     Pulse Rate 04/08/18 1028 (!) 49     Resp 04/08/18 1028 15     Temp 04/08/18 1028 97.8 F (36.6 C)     Temp Source 04/08/18 1028 Oral     SpO2 04/08/18 1028 95 %     Weight 04/08/18 1030 (!) 322 lb (146.1 kg)     Height 04/08/18 1030 5\' 5"  (1.651 m)     Head Circumference --      Peak Flow --      Pain Score 04/08/18 1029 2     Pain Loc --      Pain Edu? --      Excl. in [place]? --     Constitutional: Alert and oriented. Well appearing and in no acute distress. Eyes: Conjunctivae are normal.  Head: Atraumatic. Nose: No congestion/rhinnorhea.  Mouth/Throat: Mucous membranes are moist.  Oropharynx non-erythematous. Neck: No stridor.   Cardiovascular: Normal  rate, regular rhythm. Grossly normal heart sounds.  Good peripheral circulation. Respiratory: Normal respiratory effort.  No retractions. Lungs CTAB. Gastrointestinal: Soft and nontender. No distention. No abdominal bruits. No CVA tenderness. Musculoskeletal: No lower extremity tenderness nor edema.  Neurologic:  Normal speech and language. No gross focal neurologic deficits are appreciated. . Skin:  Skin is warm, dry and intact. No rash noted. Psychiatric: Mood and affect are normal. Speech and behavior are normal.  ____________________________________________   LABS (all labs ordered are listed, but only abnormal results are displayed)  Labs Reviewed  COMPREHENSIVE METABOLIC PANEL - Abnormal; Notable for the following components:      Result Value   BUN 24 (*)    Creatinine, Ser 1.03 (*)    Calcium 8.8 (*)    GFR calc non Af Amer 56 (*)    All other components within normal limits  CBC WITH DIFFERENTIAL/PLATELET  TROPONIN I  TROPONIN I   ____________________________________________  EKG  ____________________________________________  RADIOLOGY  ED MD interpretation:    Official radiology report(s): Dg Chest Portable 1 View  Result Date: 04/08/2018 CLINICAL DATA:  Hypertension with chest pain EXAM: PORTABLE CHEST 1 VIEW COMPARISON:  Nov 21, 2017 FINDINGS: There is no edema or consolidation. There is cardiomegaly with pulmonary vascularity normal. No adenopathy. No bone lesions. IMPRESSION: Stable cardiomegaly.  No edema or consolidation. Electronically Signed   By: Lowella Grip III M.D.   On: 04/08/2018 11:22    ____________________________________________   PROCEDURES  Procedure(s) performed:  Procedures  Critical Care performed:   ____________________________________________   INITIAL IMPRESSION / ASSESSMENT AND PLAN / ED COURSE          ____________________________________________   FINAL CLINICAL IMPRESSION(S) / ED DIAGNOSES  Patient's  troponins are negative x2.  Heart rate has come up.  Blood pressure is now 160 systolic.  She feels better.  I am going we will get her go home.  She will follow-up with her cardiologist, Dr. [name].  I will have her call him on Monday that is tomorrow morning and get a follow-up appointment.  Final diagnoses:  Nonspecific chest pain     ED Discharge Orders    None       Note:  This document was prepared using Dragon voice recognition software and may include unintentional dictation errors.    Nena Polio, MD 04/08/18 1357

## 2018-04-08 NOTE — ED Notes (Signed)
Pt was provided with a 4oz cup of cranberry juice.

## 2018-04-08 NOTE — ED Triage Notes (Signed)
Pt arrives via [facility]. Per EMS pt stated Cp pain started last night. Per EMS pt was found to be hypertensive 231/107. Nitro patch was applied in route . Pt reported relief of CP after Nitro patch from a 5/10 down to a 2/10. VSS are stable. NAD noted

## 2018-04-09 ENCOUNTER — Telehealth: Payer: Self-pay

## 2018-04-09 NOTE — Telephone Encounter (Signed)
Refill Request.  

## 2018-04-09 NOTE — Telephone Encounter (Signed)
LVM on patients cell to advise her to stop Eliquis 1-2 days prior to her colonoscopy.  Per Alvis Lemmings RN office note.  Thanks [employer]

## 2018-04-12 ENCOUNTER — Telehealth: Payer: Self-pay

## 2018-04-12 NOTE — Telephone Encounter (Signed)
Pt contacted office to reschedule her colonoscopy procedure from 10/16 to 05/02/18.  Contacted Trish in Endoscopy to reschedule.  Updated referral.  Thanks Sharyn Lull

## 2018-04-17 ENCOUNTER — Emergency Department: Payer: Medicare Other

## 2018-04-17 ENCOUNTER — Emergency Department
Admission: EM | Admit: 2018-04-17 | Discharge: 2018-04-17 | Disposition: A | Discharge status: Home / Self Care | Payer: Medicare Other | Attending: Emergency Medicine | Admitting: Emergency Medicine

## 2018-04-17 ENCOUNTER — Telehealth: Payer: Self-pay

## 2018-04-17 ENCOUNTER — Ambulatory Visit: Payer: Self-pay | Admitting: Family

## 2018-04-17 ENCOUNTER — Other Ambulatory Visit: Payer: Self-pay

## 2018-04-17 ENCOUNTER — Encounter: Payer: Self-pay | Admitting: Emergency Medicine

## 2018-04-17 DIAGNOSIS — Z87891 Personal history of nicotine dependence: Secondary | ICD-10-CM | POA: Insufficient documentation

## 2018-04-17 DIAGNOSIS — I509 Heart failure, unspecified: Secondary | ICD-10-CM | POA: Insufficient documentation

## 2018-04-17 DIAGNOSIS — I10 Essential (primary) hypertension: Secondary | ICD-10-CM

## 2018-04-17 DIAGNOSIS — J449 Chronic obstructive pulmonary disease, unspecified: Secondary | ICD-10-CM | POA: Insufficient documentation

## 2018-04-17 DIAGNOSIS — R42 Dizziness and giddiness: Secondary | ICD-10-CM | POA: Diagnosis not present

## 2018-04-17 DIAGNOSIS — I110 Hypertensive heart disease with heart failure: Secondary | ICD-10-CM | POA: Diagnosis not present

## 2018-04-17 DIAGNOSIS — R51 Headache: Secondary | ICD-10-CM | POA: Diagnosis present

## 2018-04-17 LAB — BASIC METABOLIC PANEL
Anion gap: 8 (ref 5–15)
BUN: 16 mg/dL (ref 8–23)
CO2: 26 mmol/L (ref 22–32)
Calcium: 8.6 mg/dL — ABNORMAL LOW (ref 8.9–10.3)
Chloride: 107 mmol/L (ref 98–111)
Creatinine, Ser: 0.76 mg/dL (ref 0.44–1.00)
GFR calc Af Amer: >60 mL/min (ref >60)
GFR calc non Af Amer: >60 mL/min (ref >60)
Glucose, Bld: 99 mg/dL (ref 70–99)
Potassium: 4.1 mmol/L (ref 3.5–5.1)
Sodium: 141 mmol/L (ref 135–145)

## 2018-04-17 LAB — CBC
HCT: 42.8 % (ref 36.0–46.0)
Hemoglobin: 14 g/dL (ref 12.0–15.0)
MCH: 29.7 pg (ref 26.0–34.0)
MCHC: 32.7 g/dL (ref 30.0–36.0)
MCV: 90.9 fL (ref 80.0–100.0)
Platelets: 172 10*3/uL (ref 150–400)
RBC: 4.71 MIL/uL (ref 3.87–5.11)
RDW: 13.2 % (ref 11.5–15.5)
WBC: 6.1 10*3/uL (ref 4.0–10.5)
nRBC: 0 % (ref 0.0–0.2)

## 2018-04-17 LAB — TROPONIN I: Troponin I: <0.03 ng/mL (ref <0.03)

## 2018-04-17 MED ORDER — HYDRALAZINE HCL 50 MG PO TABS
25.0000 mg | ORAL_TABLET | Freq: Once | ORAL | Status: AC
  Administered 2018-04-17: 25 mg via ORAL
  Filled 2018-04-17: qty 1

## 2018-04-17 MED ORDER — MECLIZINE HCL 25 MG PO TABS: 25.0000 mg | ORAL_TABLET | Freq: Three times a day (TID) | ORAL | 1 refills | Status: DC | PRN

## 2018-04-17 MED ORDER — MECLIZINE HCL 25 MG PO TABS
25.0000 mg | ORAL_TABLET | Freq: Once | ORAL | Status: AC
  Administered 2018-04-17: 25 mg via ORAL
  Filled 2018-04-17: qty 1

## 2018-04-17 MED ORDER — HYDRALAZINE HCL 25 MG PO TABS: 25.0000 mg | ORAL_TABLET | Freq: Four times a day (QID) | ORAL | 11 refills | Status: DC

## 2018-04-17 MED ORDER — DIAZEPAM 5 MG PO TABS
5.0000 mg | ORAL_TABLET | Freq: Once | ORAL | Status: AC
  Administered 2018-04-17: 5 mg via ORAL
  Filled 2018-04-17: qty 1

## 2018-04-17 MED ORDER — BISOPROLOL FUMARATE 5 MG PO TABS
10.0000 mg | ORAL_TABLET | Freq: Once | ORAL | Status: DC
  Filled 2018-04-17: qty 2

## 2018-04-17 NOTE — Telephone Encounter (Signed)
Contacted patient to make sure she received my message regarding stopping blood thinner 1-2 days prior to  Colonoscopy.  Thanks [employer]

## 2018-04-17 NOTE — ED Notes (Signed)
ED Provider at bedside. 

## 2018-04-17 NOTE — ED Notes (Signed)
Patient transported to CT 

## 2018-04-17 NOTE — Telephone Encounter (Signed)
Patient was seen at [facility] and observed at hospital a week ago for elevated BP- it can down and patient was discharged. Patient has noticed that in the morning her BP is elevated. Once she takes her BP medication- it comes down. Patient states her BP- 120/70 range during the day. Patient takes her night time dose and then she wakes in the morning and her BP is up. Patient takes Losartan in am. She takes bisoprolol , ranolazine and amiodarone at night.  Reason for Disposition . [8] Systolic BP  >= 341 OR Diastolic >= 962  AND [2] having NO cardiac or neurologic symptoms    Patient is having fluctuating BP- uncontrolled on medication- advised ED after speaking with FC.  Answer Assessment - Initial Assessment Questions 1. BLOOD PRESSURE: "What is the blood pressure?" "Did you take at least two measurements 5 minutes apart?"     243/136         160/89          191/95 2. ONSET: "When did you take your blood pressure?"     7:30                 10:00         10:31 3. HOW: "How did you obtain the blood pressure?" (e.g., visiting nurse, automatic home BP monitor)     Automatic cuff- wrist 4. HISTORY: "Do you have a history of high blood pressure?"     yes 5. MEDICATIONS: "Are you taking any medications for blood pressure?" "Have you missed any doses recently?"     Yes- no 6. OTHER SYMPTOMS: "Do you have any symptoms?" (e.g., headache, chest pain, blurred vision, difficulty breathing, weakness)     Sometimes she has weakness- she wakes with headache and dizziness- she can tell when it comes down- she feels better 7. PREGNANCY: "Is there any chance you are pregnant?" "When was your last menstrual period?"     n/a  Protocols used: HIGH BLOOD PRESSURE-A-AH

## 2018-04-17 NOTE — Telephone Encounter (Signed)
Patient triaged by [facility] nurse and directed to go to ED

## 2018-04-17 NOTE — ED Triage Notes (Addendum)
Pt arrived via POV with reports of elevated blood pressure with dizziness, and headache. Pt states she woke up this morning and yesterday dizzy and headache.  Pt denies any chest pain.   Pt currently taking Losartan 25mg  last dose at 0800 and Bisoprolol 5mg  at night-last dose was last night 11pm.

## 2018-04-17 NOTE — ED Notes (Signed)
Pt alert and oriented X4, active, cooperative, pt in NAD. RR even and unlabored, color WNL.  Pt informed to return if any life threatening symptoms occur.  Discharge and followup instructions reviewed. Ambulates safely. 

## 2018-04-17 NOTE — ED Provider Notes (Signed)
[facility] Emergency Department Provider Note       Time seen: ----------------------------------------- 12:53 PM on 04/17/2018 -----------------------------------------   I have reviewed the triage vital signs and the nursing notes.  HISTORY   Chief Complaint Hypertension; Dizziness; and Headache    HPI Amber Barnes is a 67 y.o. female with a history of anxiety, A. fib, CHF, chronic pain, COPD, depression, hyperlipidemia, hypertension, obesity who presents to the ED for hypertension as well as dizziness and headache.  Patient states she woke up this morning and has been having a headache and room spinning sensation.  She also had the symptoms yesterday.  She has been having them intermittently and it is worse in the morning.  This corresponds with elevated blood pressure that seems to improve throughout the day.  She currently is taking losartan and bisoprolol and has been taking them as prescribed.  Past Medical History:  Diagnosis Date  . Achilles tendinitis   . Acute medial meniscal tear   . Allergy   . Anxiety   . Arthritis   . Atrial fibrillation ([place])    a. new onset 07/2016; b. CHADS2VASc --> 2 (HTN, female); c. eliquis  . CHF (congestive heart failure) ([place])   . Chronic lower back pain   . COPD (chronic obstructive pulmonary disease) ([place])   . Depression   . Fibrocystic breast disease   . GERD (gastroesophageal reflux disease)   . Hyperlipidemia   . Hypertension   . Insomnia   . Lipoma   . Migraine   . Osteoarthritis    left knee  . Osteopenia   . Tendonitis   . Thyroid disease   . Vitamin D deficiency     Patient Active Problem List   Diagnosis Date Noted  . Osteoarthritis of hip (Right) 04/05/2018  . History of allergy to latex 04/05/2018  . Spondylosis without myelopathy or radiculopathy, lumbosacral region 03/27/2018  . Other specified dorsopathies, sacral and sacrococcygeal region 03/27/2018  . Lumbar facet joint  syndrome (Right) 03/21/2018  . Chronic sacroiliac joint pain (Left) 03/21/2018  . Chronic anticoagulation (Eliquis) 03/21/2018  . Chronic groin pain (Primary Area of Pain) (Right) 02/27/2018  . Chronic lower extremity pain (Secondary Area of Pain) (Right) 02/27/2018  . Chronic hip pain [facility] Area of Pain) (Right) 02/27/2018  . Chronic low back pain (Fourth Area of Pain) (Bilateral) w/ sciatica (Right) 02/27/2018  . Chronic knee pain (Bilateral) 02/27/2018  . Chronic pain syndrome 02/27/2018  . Pharmacologic therapy 02/27/2018  . Disorder of skeletal system 02/27/2018  . Problems influencing health status 02/27/2018  . Screening mammogram, encounter for 02/02/2018  . Numbness and tingling 12/08/2017  . Olecranon bursitis of left elbow 11/14/2017  . Cervical radiculopathy 11/14/2017  . Atherosclerosis of aorta ([place]) 08/25/2017  . Migraine with aura and without status migrainosus, not intractable 06/27/2017  . Bradycardia 05/22/2017  . Depression, recurrent ([place]) 05/22/2017  . Restless leg 04/13/2017  . OSA (obstructive sleep apnea) 01/09/2017  . Acute pain of left shoulder 11/28/2016  . Persistent atrial fibrillation 08/11/2016  . Acute pulmonary edema (HCC)   . New onset a-fib ([place]) 07/28/2016  . Hypokalemia 07/28/2016  . Left foot pain 07/28/2016  . Atypical chest pain   . Fatty liver 06/30/2016  . Dizziness 06/08/2016  . Family history of ovarian cancer 06/08/2016  . GERD (gastroesophageal reflux disease) 05/05/2016  . Seasonal rhinitis 05/05/2016  . Routine physical examination 05/05/2016  . Heel spur 11/05/2015  . Tendonitis 11/05/2015  . SOB (  shortness of breath) 11/11/2014  . Morbid obesity ([place]) 11/11/2014  . Mixed hyperlipidemia 11/11/2014  . Essential hypertension 11/11/2014  . Anxiety and depression 11/11/2014  . Arthritis, senescent 11/11/2014  . Weight loss due to medication 11/11/2014  . Obesity, Class III, BMI 40-49.9 (morbid obesity) ([place]) 04/11/2013     Past Surgical History:  Procedure Laterality Date  . ABDOMINAL HYSTERECTOMY     ovaries left  . ACHILLES TENDON SURGERY     2012,01/2017  . BACK SURGERY    . CARDIOVERSION N/A 09/07/2016   Procedure: CARDIOVERSION;  Surgeon: Minna Merritts, MD;  Location: [facility] ORS;  Service: Cardiovascular;  Laterality: N/A;  . CARDIOVERSION N/A 10/19/2016   Procedure: CARDIOVERSION;  Surgeon: Minna Merritts, MD;  Location: [facility] ORS;  Service: Cardiovascular;  Laterality: N/A;  . CARDIOVERSION N/A 12/14/2016   Procedure: CARDIOVERSION;  Surgeon: Minna Merritts, MD;  Location: [facility] ORS;  Service: Cardiovascular;  Laterality: N/A;  . COLONOSCOPY    . ESOPHAGOGASTRODUODENOSCOPY    . THYROIDECTOMY     thyroid lobectomy secondary to a benign nodule    Allergies Prednisone; Hydrocodone-acetaminophen; Oxycodone; Tramadol; Adhesive [tape]; and Latex  Social History Social History   Tobacco Use  . Smoking status: Former Smoker    Packs/day: 0.25    Years: 10.00    Pack years: 2.50    Types: Cigarettes    Last attempt to quit: 2005    Years since quitting: 14.7  . Smokeless tobacco: Former Systems developer    Quit date: 07/11/2001  . Tobacco comment: smoked for 10 years, 1 pack every 2-3 days  Substance Use Topics  . Alcohol use: Not Currently    Comment: social, occasional  . Drug use: No   Review of Systems Constitutional: Negative for fever. Cardiovascular: Negative for chest pain. Respiratory: Negative for shortness of breath. Gastrointestinal: Negative for abdominal pain, vomiting and diarrhea. Musculoskeletal: Negative for back pain. Skin: Negative for rash. Neurological: Positive for headache, dizziness, vertigo  All systems negative/normal/unremarkable except as stated in the HPI  ____________________________________________   PHYSICAL EXAM:  VITAL SIGNS: ED Triage Vitals  Enc Vitals Group     BP 04/17/18 1116 (!) 171/100     Pulse Rate 04/17/18 1116 (!) 56     Resp 04/17/18 1116  18     Temp 04/17/18 1116 97.9 F (36.6 C)     Temp Source 04/17/18 1116 Oral     SpO2 04/17/18 1116 95 %     Weight 04/17/18 1117 (!) 324 lb (147 kg)     Height 04/17/18 1117 5\' 5"  (1.651 m)     Head Circumference --      Peak Flow --      Pain Score --      Pain Loc --      Pain Edu? --      Excl. in [place]? --    Constitutional: Alert and oriented.  Morbidly obese, no distress Eyes: Conjunctivae are normal. Normal extraocular movements. ENT   Head: Normocephalic and atraumatic.   Nose: No congestion/rhinnorhea.   Mouth/Throat: Mucous membranes are moist.   Neck: No stridor. Cardiovascular: Normal rate, regular rhythm. No murmurs, rubs, or gallops. Respiratory: Normal respiratory effort without tachypnea nor retractions. Breath sounds are clear and equal bilaterally. No wheezes/rales/rhonchi. Gastrointestinal: Soft and nontender. Normal bowel sounds Musculoskeletal: Nontender with normal range of motion in extremities. No lower extremity tenderness nor edema. Neurologic:  Normal speech and language. No gross focal neurologic deficits are appreciated.  Strength, sensation,  cranial nerves are normal Skin:  Skin is warm, dry and intact. No rash noted. Psychiatric: Mood and affect are normal. Speech and behavior are normal.  ____________________________________________  EKG: Interpreted by me.  Sinus bradycardia with rate 52 bpm, normal PR interval, wide QRS, normal QT  ____________________________________________  ED COURSE:  As part of my medical decision making, I reviewed the following data within the [place] History obtained from family if available, nursing notes, old chart and ekg, as well as notes from prior ED visits. Patient presented for hypertension as well as dizziness and headache, we will assess with labs and imaging as indicated at this time.   Procedures ____________________________________________   LABS (pertinent  positives/negatives)  Labs Reviewed  BASIC METABOLIC PANEL - Abnormal; Notable for the following components:      Result Value   Calcium 8.6 (*)    All other components within normal limits  CBC  TROPONIN I    RADIOLOGY  CT head Is unremarkable ____________________________________________  DIFFERENTIAL DIAGNOSIS   Hypertensive urgency, hypertensive emergency, peripheral vertigo, medication noncompliance, dehydration, arrhythmia, MI  FINAL ASSESSMENT AND PLAN  Hypertension, vertigo   Plan: The patient had presented for hypertension as well as dizziness and headache. Patient's labs did not reveal any acute process. Patient's imaging is also negative.  I did start her on hydralazine because her blood pressure has been markedly elevated but her heart rate is too low running in the 40s at times.  She did seem to have improvement in her symptoms and blood pressure.  We will continue on this regimen and have her follow-up with her doctor.   Laurence Aly, MD   Note: This note was generated in part or whole with voice recognition software. Voice recognition is usually quite accurate but there are transcription errors that can and very often do occur. I apologize for any typographical errors that were not detected and corrected.     Earleen Newport, MD 04/17/18 1359

## 2018-04-18 NOTE — Progress Notes (Deleted)
Subjective:    Patient ID: Amber Barnes, female    DOB: June 29, 1951, 67 y.o.   MRN: [id number]  CC: Amber Barnes is a 67 y.o. female who presents today for follow up.   HPI: HPI Patient presenting to emergency room 10 /8 for elevated blood pressure.  started on hydralazine, meclizineprn.  She had vertigo at that time.  CT of head was negative.troponin negative HISTORY:  Past Medical History:  Diagnosis Date  . Achilles tendinitis   . Acute medial meniscal tear   . Allergy   . Anxiety   . Arthritis   . Atrial fibrillation ([place])    a. new onset 07/2016; b. CHADS2VASc --> 2 (HTN, female); c. eliquis  . CHF (congestive heart failure) ([place])   . Chronic lower back pain   . COPD (chronic obstructive pulmonary disease) ([place])   . Depression   . Fibrocystic breast disease   . GERD (gastroesophageal reflux disease)   . Hyperlipidemia   . Hypertension   . Insomnia   . Lipoma   . Migraine   . Osteoarthritis    left knee  . Osteopenia   . Tendonitis   . Thyroid disease   . Vitamin D deficiency    Past Surgical History:  Procedure Laterality Date  . ABDOMINAL HYSTERECTOMY     ovaries left  . ACHILLES TENDON SURGERY     2012,01/2017  . BACK SURGERY    . CARDIOVERSION N/A 09/07/2016   Procedure: CARDIOVERSION;  Surgeon: Minna Merritts, MD;  Location: [facility] ORS;  Service: Cardiovascular;  Laterality: N/A;  . CARDIOVERSION N/A 10/19/2016   Procedure: CARDIOVERSION;  Surgeon: Minna Merritts, MD;  Location: [facility] ORS;  Service: Cardiovascular;  Laterality: N/A;  . CARDIOVERSION N/A 12/14/2016   Procedure: CARDIOVERSION;  Surgeon: Minna Merritts, MD;  Location: [facility] ORS;  Service: Cardiovascular;  Laterality: N/A;  . COLONOSCOPY    . ESOPHAGOGASTRODUODENOSCOPY    . THYROIDECTOMY     thyroid lobectomy secondary to a benign nodule   Family History  Problem Relation Age of Onset  . Cancer Sister 84       ovarian  . Depression Sister   . Cancer Father 2       colon  .  Heart disease Father   . Heart attack Father 57       died from MI  . Hyperlipidemia Father   . Hypertension Father   . Cancer Mother        leukemia  . Breast cancer Paternal Aunt        43's  . Migraines Neg Hx     Allergies: Prednisone; Hydrocodone-acetaminophen; Oxycodone; Tramadol; Adhesive [tape]; and Latex Current Outpatient Medications on File Prior to Visit  Medication Sig Dispense Refill  . amiodarone (PACERONE) 200 MG tablet Take 1 tablet (200 mg total) by mouth daily. 90 tablet 3  . B Complex Vitamins (VITAMIN B COMPLEX PO) Take 1 tablet by mouth daily.     . bisoprolol (ZEBETA) 5 MG tablet Take 1 tablet (5 mg total) by mouth daily. 90 tablet 3  . calcium gluconate 500 MG tablet Take 500 tablets by mouth daily.    . Cholecalciferol (VITAMIN D3) 5000 units CAPS Take 5,000 Units by mouth daily.    . cyclobenzaprine (FLEXERIL) 5 MG tablet TAKE 1 TABLET BY MOUTH THREE TIMES A DAY 90 tablet 0  . ELIQUIS 5 MG TABS tablet TAKE 1 TABLET BY MOUTH TWICE A DAY 60 tablet 3  . esomeprazole (NEXIUM)  20 MG capsule Take 20 mg by mouth daily as needed (acid reflux).    . fluvoxaMINE (LUVOX) 50 MG tablet TAKE 2.5 TABLETS (125 MG TOTAL) BY MOUTH AT BEDTIME. 75 tablet 2  . furosemide (LASIX) 20 MG tablet TAKE 1 TABLET (20 MG TOTAL) BY MOUTH 2 (TWO) TIMES DAILY. 180 tablet 3  . gabapentin (NEURONTIN) 400 MG capsule Take 1 capsule (400 mg total) by mouth 2 (two) times daily. 180 capsule 1  . hydrALAZINE (APRESOLINE) 25 MG tablet Take 1 tablet (25 mg total) by mouth 4 (four) times daily. 120 tablet 11  . hydrOXYzine (ATARAX/VISTARIL) 10 MG tablet Take 2 tablets (20 mg total) by mouth 2 (two) times daily as needed. 120 tablet 1  . KLOR-CON 10 10 MEQ tablet TAKE 1 TABLET (10 MEQ TOTAL) BY MOUTH 2 (TWO) TIMES DAILY. 180 tablet 0  . losartan (COZAAR) 25 MG tablet TAKE 1 TABLET BY MOUTH EVERY DAY 30 tablet 6  . meclizine (ANTIVERT) 25 MG tablet Take 1 tablet (25 mg total) by mouth 3 (three) times daily  as needed for dizziness or nausea. 30 tablet 1  . Multiple Vitamin (MULTIVITAMIN) tablet Take 1 tablet by mouth daily.    Marland Kitchen oxymetazoline (AFRIN) 0.05 % nasal spray Place 1 spray into both nostrils at bedtime as needed for congestion.     . ranolazine (RANEXA) 500 MG 12 hr tablet TAKE 1 TABLET BY MOUTH TWICE A DAY 60 tablet 3  . rizatriptan (MAXALT) 10 MG tablet TAKE 1 TABLET (10 MG TOTAL) BY MOUTH AS NEEDED FOR MIGRAINE. MAY REPEAT IN 2 HOURS IF NEEDED 10 tablet 0  . rosuvastatin (CRESTOR) 20 MG tablet TAKE 1 TABLET BY MOUTH EVERY DAY 90 tablet 0  . traMADol (ULTRAM) 50 MG tablet Take 1-2 tablets (50-100 mg total) by mouth every 6 (six) hours as needed for up to 7 days for severe pain. 56 tablet 0  . traMADol (ULTRAM) 50 MG tablet Take 1-2 tablets (50-100 mg total) by mouth every 6 (six) hours as needed for severe pain. (Patient not taking: Reported on 04/05/2018) 240 tablet 0  . traZODone (DESYREL) 50 MG tablet TAKE 0.5-1 TABLETS (25-50 MG TOTAL) BY MOUTH AT BEDTIME. SLEEP 30 tablet 2   No current facility-administered medications on file prior to visit.     Social History   Tobacco Use  . Smoking status: Former Smoker    Packs/day: 0.25    Years: 10.00    Pack years: 2.50    Types: Cigarettes    Last attempt to quit: 2005    Years since quitting: 14.7  . Smokeless tobacco: Former Systems developer    Quit date: 07/11/2001  . Tobacco comment: smoked for 10 years, 1 pack every 2-3 days  Substance Use Topics  . Alcohol use: Not Currently    Comment: social, occasional  . Drug use: No    Review of Systems    Objective:    There were no vitals taken for this visit. BP Readings from Last 3 Encounters:  04/17/18 (!) 173/86  04/08/18 (!) 147/77  04/05/18 (!) 191/88   Wt Readings from Last 3 Encounters:  04/17/18 (!) 324 lb (147 kg)  04/08/18 (!) 322 lb (146.1 kg)  04/05/18 (!) 320 lb (145.2 kg)    Physical Exam     Assessment & Plan:   Problem List Items Addressed This Visit    None         I am having Amber Barnes "[name]" maintain her multivitamin,  B Complex Vitamins (VITAMIN B COMPLEX PO), oxymetazoline, Vitamin D3, esomeprazole, calcium gluconate, amiodarone, bisoprolol, furosemide, gabapentin, ranolazine, losartan, rosuvastatin, hydrOXYzine, KLOR-CON 10, traZODone, rizatriptan, fluvoxaMINE, traMADol, traMADol, cyclobenzaprine, ELIQUIS, meclizine, and hydrALAZINE.   No orders of the defined types were placed in this encounter.   Return precautions given.   Risks, benefits, and alternatives of the medications and treatment plan prescribed today were discussed, and patient expressed understanding.   Education regarding symptom management and diagnosis given to patient on AVS.  Continue to follow with [name], FNP for routine health maintenance.   Amber Barnes and I agreed with plan.   [name], FNP

## 2018-04-20 ENCOUNTER — Inpatient Hospital Stay: Payer: Self-pay | Admitting: Family

## 2018-04-23 ENCOUNTER — Ambulatory Visit: Payer: Self-pay | Admitting: Pain Medicine

## 2018-04-23 NOTE — Progress Notes (Signed)
Patient's Name: Amber Barnes  MRN: [id number]  Referring Provider: [name], FNP  DOB: 06-28-1951  PCP: [name], FNP  DOS: 04/25/2018  Note by: Gaspar Cola, MD  Service setting: Ambulatory outpatient  Specialty: Interventional Pain Management  Location: [facility] (AMB) Pain Management Facility    Patient type: Established   Primary Reason(s) for Visit: Encounter for post-procedure evaluation of chronic illness with mild to moderate exacerbation CC: Back Pain  HPI  Amber Barnes is a 67 y.o. year old, female patient, who comes today for a post-procedure evaluation. She has Obesity, Class III, BMI 40-49.9 (morbid obesity) ([place]); Heel spur; Tendonitis; SOB (shortness of breath); Morbid obesity ([place]); Mixed hyperlipidemia; Essential hypertension; Anxiety and depression; Arthritis, senescent; Weight loss due to medication; GERD (gastroesophageal reflux disease); Seasonal rhinitis; Routine physical examination; Dizziness; Family history of ovarian cancer; Fatty liver; New onset a-fib ([place]); Hypokalemia; Left foot pain; Atypical chest pain; Persistent atrial fibrillation; Acute pulmonary edema ([place]); Acute pain of left shoulder; OSA (obstructive sleep apnea); Restless leg; Bradycardia; Depression, recurrent ([place]); Migraine with aura and without status migrainosus, not intractable; Atherosclerosis of aorta ([place]); Olecranon bursitis of left elbow; Cervical radiculopathy; Screening mammogram, encounter for; Numbness and tingling; Chronic groin pain (Primary Area of Pain) (Right); Chronic lower extremity pain (Secondary Area of Pain) (Right); Chronic hip pain [facility] Area of Pain) (Right); Chronic low back pain (Fourth Area of Pain) (Bilateral) w/ sciatica (Right); Chronic knee pain (Bilateral); Chronic pain syndrome; Pharmacologic therapy; Disorder of skeletal system; Problems influencing health status; Lumbar facet joint syndrome (Right); Chronic sacroiliac joint pain (Left); Chronic  anticoagulation (Eliquis); Spondylosis without myelopathy or radiculopathy, lumbosacral region; Other specified dorsopathies, sacral and sacrococcygeal region; Osteoarthritis of hip (Right); History of allergy to latex; History of lumbar laminectomy; and Failed back surgical syndrome on their problem list. Her primarily concern today is the Back Pain  Pain Assessment: Location: Lower Back Radiating: pain radiaties down right leg Onset: More than a month ago Duration: Chronic pain Quality: Aching, Discomfort, Burning Severity: 7 /10 (subjective, self-reported pain score)  Note: Reported level is inconsistent with clinical observations. Clinically the patient looks like a 2/10 A 2/10 is viewed as "Mild to Moderate" and described as noticeable and distracting. Impossible to hide from other people. More frequent flare-ups. Still possible to adapt and function close to normal. It can be very annoying and may have occasional stronger flare-ups. With discipline, patients may get used to it and adapt. Information on the proper use of the pain scale provided to the patient today. When using our objective Pain Scale, levels between 6 and 10/10 are said to belong in an emergency room, as it progressively worsens from a 6/10, described as severely limiting, requiring emergency care not usually available at an outpatient pain management facility. At a 6/10 level, communication becomes difficult and requires great effort. Assistance to reach the emergency department may be required. Facial flushing and profuse sweating along with potentially dangerous increases in heart rate and blood pressure will be evident. Effect on ADL: limits my daily activites and no prolonged walking and tanding Timing: Constant Modifying factors: meds, ice and heat BP: 119/64  HR: (!) 59  Amber Barnes comes in today for post-procedure evaluation.  Further details on both, my assessment(s), as well as the proposed treatment plan, please see  below.  Post-Procedure Assessment  04/05/2018 Procedure: Diagnostic right intra-articular hip joint injection #1 under fluoroscopic guidance and IV sedation Pre-procedure pain score:  10/10 Post-procedure pain score: 0/10 (100% relief)  Influential Factors: BMI: 53.08 kg/m Intra-procedural challenges: None observed.         Assessment challenges: None detected.              Reported side-effects: None.        Post-procedural adverse reactions or complications: None reported         Sedation: Sedation provided. When no sedatives are used, the analgesic levels obtained are directly associated to the effectiveness of the local anesthetics. However, when sedation is provided, the level of analgesia obtained during the initial 1 hour following the intervention, is believed to be the result of a combination of factors. These factors may include, but are not limited to: 1. The effectiveness of the local anesthetics used. 2. The effects of the analgesic(s) and/or anxiolytic(s) used. 3. The degree of discomfort experienced by the patient at the time of the procedure. 4. The patients ability and reliability in recalling and recording the events. 5. The presence and influence of possible secondary gains and/or psychosocial factors. Reported result: Relief experienced during the 1st hour after the procedure: 100 % (Ultra-Short Term Relief) Amber Barnes has indicated area to have been numb during this time. Interpretative annotation: Clinically appropriate result. Analgesia during this period is likely to be Local Anesthetic and/or IV Sedative (Analgesic/Anxiolytic) related.          Effects of local anesthetic: The analgesic effects attained during this period are directly associated to the localized infiltration of local anesthetics and therefore cary significant diagnostic value as to the etiological location, or anatomical origin, of the pain. Expected duration of relief is directly dependent on the  pharmacodynamics of the local anesthetic used. Long-acting (4-6 hours) anesthetics used.  Reported result: Relief during the next 4 to 6 hour after the procedure: 100 % (Short-Term Relief) Amber Barnes has indicated area to have been numb during this time. Interpretative annotation: Clinically appropriate result. Analgesia during this period is likely to be Local Anesthetic-related.          Long-term benefit: Defined as the period of time past the expected duration of local anesthetics (1 hour for short-acting and 4-6 hours for long-acting). With the possible exception of prolonged sympathetic blockade from the local anesthetics, benefits during this period are typically attributed to, or associated with, other factors such as analgesic sensory neuropraxia, antiinflammatory effects, or beneficial biochemical changes provided by agents other than the local anesthetics.  Reported result: Extended relief following procedure: 100 % (Long-Term Relief)            Interpretative annotation: Clinically possible results. Good relief. No permanent benefit expected. Inflammation plays a part in the etiology to the pain. Benefit believed to be steroid-related.  Current benefits: Defined as reported results that persistent at this point in time.   Analgesia: 100 % Amber Barnes reports improvement of arthralgia. Function: Amber Barnes reports improvement in function ROM: Amber Barnes reports improvement in ROM Interpretative annotation: Complete relief. Therapeutic success. Effective therapeutic approach.          Interpretation: Results would suggest a successful diagnostic intervention.                  Plan:  Set up procedure as a PRN palliative treatment option for this patient.                Laboratory Chemistry  Inflammation Markers (CRP: Acute Phase) (ESR: Chronic Phase) Lab Results  Component Value Date   CRP 7 02/27/2018   ESRSEDRATE 33 02/27/2018  Renal Markers Lab Results   Component Value Date   BUN 16 04/17/2018   CREATININE 0.76 04/17/2018   BCR 18 02/27/2018   GFRAA >60 04/17/2018   GFRNONAA >60 04/17/2018                             Hepatic Markers Lab Results  Component Value Date   AST 28 04/08/2018   ALT 28 04/08/2018   ALBUMIN 4.3 04/08/2018   HCVAB NEGATIVE 05/12/2016                        Neuropathy Markers Lab Results  Component Value Date   [id number] 124 02/27/2018   HGBA1C 5.5 04/03/2018   HIV NONREACTIVE 05/12/2016                        Hematology Parameters Lab Results  Component Value Date   INR 1.05 04/30/2017   LABPROT 13.6 04/30/2017   PLT 172 04/17/2018   HGB 14.0 04/17/2018   HCT 42.8 04/17/2018                        CV Markers Lab Results  Component Value Date   BNP 278.0 (H) 08/10/2016   TROPONINI <0.03 04/17/2018                         Note: Lab results reviewed.  Recent Imaging Results   Results for orders placed in visit on 04/05/18  DG C-Arm 1-60 Min-No Report   Narrative Fluoroscopy was utilized by the requesting physician.  No radiographic  interpretation.    Interpretation Report: Fluoroscopy was used during the procedure to assist with needle guidance. The images were interpreted intraoperatively by the requesting physician.  Meds   Current Outpatient Medications:  .  amiodarone (PACERONE) 200 MG tablet, Take 1 tablet (200 mg total) by mouth daily., Disp: 90 tablet, Rfl: 3 .  B Complex Vitamins (VITAMIN B COMPLEX PO), Take 1 tablet by mouth daily. , Disp: , Rfl:  .  bisoprolol (ZEBETA) 5 MG tablet, Take 1 tablet (5 mg total) by mouth daily., Disp: 90 tablet, Rfl: 3 .  calcium gluconate 500 MG tablet, Take 500 tablets by mouth daily., Disp: , Rfl:  .  Cholecalciferol (VITAMIN D3) 5000 units CAPS, Take 5,000 Units by mouth daily., Disp: , Rfl:  .  cyclobenzaprine (FLEXERIL) 5 MG tablet, TAKE 1 TABLET BY MOUTH THREE TIMES A DAY, Disp: 90 tablet, Rfl: 0 .  ELIQUIS 5 MG TABS tablet, TAKE 1  TABLET BY MOUTH TWICE A DAY, Disp: 60 tablet, Rfl: 3 .  esomeprazole (NEXIUM) 20 MG capsule, Take 20 mg by mouth daily as needed (acid reflux)., Disp: , Rfl:  .  fluvoxaMINE (LUVOX) 50 MG tablet, TAKE 2.5 TABLETS (125 MG TOTAL) BY MOUTH AT BEDTIME., Disp: 75 tablet, Rfl: 2 .  furosemide (LASIX) 20 MG tablet, TAKE 1 TABLET (20 MG TOTAL) BY MOUTH 2 (TWO) TIMES DAILY., Disp: 180 tablet, Rfl: 3 .  gabapentin (NEURONTIN) 400 MG capsule, Take 1 capsule (400 mg total) by mouth 2 (two) times daily., Disp: 180 capsule, Rfl: 1 .  hydrALAZINE (APRESOLINE) 25 MG tablet, Take 1 tablet (25 mg total) by mouth 4 (four) times daily., Disp: 120 tablet, Rfl: 11 .  hydrOXYzine (ATARAX/VISTARIL) 10 MG tablet, Take 2 tablets (20 mg total) by mouth 2 (  two) times daily as needed., Disp: 120 tablet, Rfl: 1 .  KLOR-CON 10 10 MEQ tablet, TAKE 1 TABLET (10 MEQ TOTAL) BY MOUTH 2 (TWO) TIMES DAILY., Disp: 180 tablet, Rfl: 0 .  losartan (COZAAR) 25 MG tablet, TAKE 1 TABLET BY MOUTH EVERY DAY, Disp: 30 tablet, Rfl: 6 .  meclizine (ANTIVERT) 25 MG tablet, Take 1 tablet (25 mg total) by mouth 3 (three) times daily as needed for dizziness or nausea., Disp: 30 tablet, Rfl: 1 .  Multiple Vitamin (MULTIVITAMIN) tablet, Take 1 tablet by mouth daily., Disp: , Rfl:  .  oxymetazoline (AFRIN) 0.05 % nasal spray, Place 1 spray into both nostrils at bedtime as needed for congestion. , Disp: , Rfl:  .  ranolazine (RANEXA) 500 MG 12 hr tablet, TAKE 1 TABLET BY MOUTH TWICE A DAY, Disp: 60 tablet, Rfl: 3 .  rizatriptan (MAXALT) 10 MG tablet, TAKE 1 TABLET (10 MG TOTAL) BY MOUTH AS NEEDED FOR MIGRAINE. MAY REPEAT IN 2 HOURS IF NEEDED, Disp: 10 tablet, Rfl: 0 .  rosuvastatin (CRESTOR) 20 MG tablet, TAKE 1 TABLET BY MOUTH EVERY DAY, Disp: 90 tablet, Rfl: 0 .  traMADol (ULTRAM) 50 MG tablet, Take 1-2 tablets (50-100 mg total) by mouth every 6 (six) hours as needed for severe pain., Disp: 240 tablet, Rfl: 0 .  traZODone (DESYREL) 50 MG tablet, TAKE  0.5-1 TABLETS (25-50 MG TOTAL) BY MOUTH AT BEDTIME. SLEEP, Disp: 30 tablet, Rfl: 2 .  traMADol (ULTRAM) 50 MG tablet, Take 1-2 tablets (50-100 mg total) by mouth every 6 (six) hours as needed for up to 7 days for severe pain., Disp: 56 tablet, Rfl: 0  ROS  Constitutional: Denies any fever or chills Gastrointestinal: No reported hemesis, hematochezia, vomiting, or acute GI distress Musculoskeletal: Denies any acute onset joint swelling, redness, loss of ROM, or weakness Neurological: No reported episodes of acute onset apraxia, aphasia, dysarthria, agnosia, amnesia, paralysis, loss of coordination, or loss of consciousness  Allergies  Amber Barnes is allergic to prednisone; hydrocodone-acetaminophen; oxycodone; tramadol; adhesive [tape]; and latex.  [place]  Drug: Amber Barnes  reports that she does not use drugs. Alcohol:  reports that she drank alcohol. Tobacco:  reports that she quit smoking about 14 years ago. Her smoking use included cigarettes. She has a 2.50 pack-year smoking history. She quit smokeless tobacco use about 16 years ago. Medical:  has a past medical history of Achilles tendinitis, Acute medial meniscal tear, Allergy, Anxiety, Arthritis, Atrial fibrillation (HCC), CHF (congestive heart failure) (HCC), Chronic lower back pain, COPD (chronic obstructive pulmonary disease) ([place]), Depression, Fibrocystic breast disease, GERD (gastroesophageal reflux disease), Hyperlipidemia, Hypertension, Insomnia, Lipoma, Migraine, Osteoarthritis, Osteopenia, Tendonitis, Thyroid disease, and Vitamin D deficiency. Surgical: Amber Barnes  has a past surgical history that includes Thyroidectomy; Colonoscopy; Esophagogastroduodenoscopy; Abdominal hysterectomy; Achilles tendon surgery; Back surgery; CARDIOVERSION (N/A, 09/07/2016); CARDIOVERSION (N/A, 10/19/2016); and CARDIOVERSION (N/A, 12/14/2016). Family: family history includes Breast cancer in her paternal aunt; Cancer in her mother; Cancer (age of onset: 42) in  her sister; Cancer (age of onset: 43) in her father; Depression in her sister; Heart attack (age of onset: 29) in her father; Heart disease in her father; Hyperlipidemia in her father; Hypertension in her father.  Constitutional Exam  General appearance: Well nourished, well developed, and well hydrated. In no apparent acute distress Vitals:   04/25/18 1300  BP: 119/64  Pulse: (!) 59  Temp: 98.4 F (36.9 C)  SpO2: 98%  Weight: (!) 319 lb (144.7 kg)  Height: 5' 5" (1.651  m)   BMI Assessment: Estimated body mass index is 53.08 kg/m as calculated from the following:   Height as of this encounter: 5' 5" (1.651 m).   Weight as of this encounter: 319 lb (144.7 kg).  BMI interpretation table: BMI level Category Range association with higher incidence of chronic pain  <18 kg/m2 Underweight   18.5-24.9 kg/m2 Ideal body weight   25-29.9 kg/m2 Overweight Increased incidence by 20%  30-34.9 kg/m2 Obese (Class I) Increased incidence by 68%  35-39.9 kg/m2 Severe obesity (Class II) Increased incidence by 136%  >40 kg/m2 Extreme obesity (Class III) Increased incidence by 254%   Patient's current BMI Ideal Body weight  Body mass index is 53.08 kg/m. Ideal body weight: 57 kg (125 lb 10.6 oz) Adjusted ideal body weight: 92.1 kg (203 lb)   BMI Readings from Last 4 Encounters:  04/25/18 53.08 kg/m  04/17/18 53.92 kg/m  04/08/18 53.58 kg/m  04/05/18 53.25 kg/m   Wt Readings from Last 4 Encounters:  04/25/18 (!) 319 lb (144.7 kg)  04/17/18 (!) 324 lb (147 kg)  04/08/18 (!) 322 lb (146.1 kg)  04/05/18 (!) 320 lb (145.2 kg)  Psych/Mental status: Alert, oriented x 3 (person, place, & time)       Eyes: PERLA Respiratory: No evidence of acute respiratory distress  Cervical Spine Area Exam  Skin & Axial Inspection: No masses, redness, edema, swelling, or associated skin lesions Alignment: Symmetrical Functional ROM: Unrestricted ROM      Stability: No instability detected Muscle  Tone/Strength: Functionally intact. No obvious neuro-muscular anomalies detected. Sensory (Neurological): Unimpaired Palpation: No palpable anomalies              Upper Extremity (UE) Exam    Side: Right upper extremity  Side: Left upper extremity  Skin & Extremity Inspection: Skin color, temperature, and hair growth are WNL. No peripheral edema or cyanosis. No masses, redness, swelling, asymmetry, or associated skin lesions. No contractures.  Skin & Extremity Inspection: Skin color, temperature, and hair growth are WNL. No peripheral edema or cyanosis. No masses, redness, swelling, asymmetry, or associated skin lesions. No contractures.  Functional ROM: Unrestricted ROM          Functional ROM: Unrestricted ROM          Muscle Tone/Strength: Functionally intact. No obvious neuro-muscular anomalies detected.  Muscle Tone/Strength: Functionally intact. No obvious neuro-muscular anomalies detected.  Sensory (Neurological): Unimpaired          Sensory (Neurological): Unimpaired          Palpation: No palpable anomalies              Palpation: No palpable anomalies              Provocative Test(s):  Phalen's test: deferred Tinel's test: deferred Apley's scratch test (touch opposite shoulder):  Action 1 (Across chest): deferred Action 2 (Overhead): deferred Action 3 (LB reach): deferred   Provocative Test(s):  Phalen's test: deferred Tinel's test: deferred Apley's scratch test (touch opposite shoulder):  Action 1 (Across chest): deferred Action 2 (Overhead): deferred Action 3 (LB reach): deferred    Thoracic Spine Area Exam  Skin & Axial Inspection: No masses, redness, or swelling Alignment: Symmetrical Functional ROM: Unrestricted ROM Stability: No instability detected Muscle Tone/Strength: Functionally intact. No obvious neuro-muscular anomalies detected. Sensory (Neurological): Unimpaired Muscle strength & Tone: No palpable anomalies  Lumbar Spine Area Exam  Skin & Axial  Inspection: No masses, redness, or swelling Alignment: Symmetrical Functional ROM: Minimal ROM  Stability: No instability detected Muscle Tone/Strength: Increased muscle tone over affected area Sensory (Neurological): Movement-associated pain Palpation: Complains of area being tender to palpation       Provocative Tests: Hyperextension/rotation test: (+) bilaterally for facet joint pain. Lumbar quadrant test (Kemp's test): (+) bilaterally for facet joint pain. Lateral bending test: deferred today       Patrick's Maneuver: Improved after treatment                   FABER test: Improved after treatment                   S-I anterior distraction/compression test: deferred today         S-I lateral compression test: deferred today         S-I Thigh-thrust test: deferred today         S-I Gaenslen's test: deferred today          Gait & Posture Assessment  Ambulation: Limited Gait: Modified gait pattern (slower gait speed, wider stride width, and longer stance duration) associated with morbid obesity Posture: Difficulty standing up straight, due to pain   Lower Extremity Exam    Side: Right lower extremity  Side: Left lower extremity  Stability: No instability observed          Stability: No instability observed          Skin & Extremity Inspection: Skin color, temperature, and hair growth are WNL. No peripheral edema or cyanosis. No masses, redness, swelling, asymmetry, or associated skin lesions. No contractures.  Skin & Extremity Inspection: Skin color, temperature, and hair growth are WNL. No peripheral edema or cyanosis. No masses, redness, swelling, asymmetry, or associated skin lesions. No contractures.  Functional ROM: Improved after treatment for hip joint          Functional ROM: Adequate ROM for hip joint          Muscle Tone/Strength: Able to Toe-walk & Heel-walk without problems  Muscle Tone/Strength: Able to Toe-walk & Heel-walk without problems  Sensory (Neurological):  Unimpaired  Sensory (Neurological): Unimpaired  Palpation: No palpable anomalies  Palpation: No palpable anomalies   Assessment  Primary Diagnosis & Pertinent Problem List: The primary encounter diagnosis was Chronic groin pain (Primary Area of Pain) (Right). Diagnoses of Chronic lower extremity pain (Secondary Area of Pain) (Right), Chronic hip pain (Tertiary Area of Pain) (Right), Chronic low back pain (Fourth Area of Pain) (Bilateral) w/ sciatica (Right), Lumbar facet joint syndrome (Right), Chronic sacroiliac joint pain (Left), History of lumbar laminectomy, Failed back surgical syndrome, Osteoarthritis of hip (Right), and Chronic anticoagulation (Eliquis) were also pertinent to this visit.  Status Diagnosis  Resolved Resolved Resolved 1. Chronic groin pain (Primary Area of Pain) (Right)   2. Chronic lower extremity pain (Secondary Area of Pain) (Right)   3. Chronic hip pain [facility] Area of Pain) (Right)   4. Chronic low back pain (Fourth Area of Pain) (Bilateral) w/ sciatica (Right)   5. Lumbar facet joint syndrome (Right)   6. Chronic sacroiliac joint pain (Left)   7. History of lumbar laminectomy   8. Failed back surgical syndrome   9. Osteoarthritis of hip (Right)   10. Chronic anticoagulation (Eliquis)     Problems updated and reviewed during this visit: Problem  History of Lumbar Laminectomy  Failed Back Surgical Syndrome   Plan of Care  Pharmacotherapy (Medications Ordered): No orders of the defined types were placed in this encounter.  Medications administered today: [name] "  [name]" had no medications administered during this visit.   Procedure Orders     HIP INJECTION     LUMBAR FACET(MEDIAL BRANCH NERVE BLOCK) [facility] Lab Orders  No laboratory test(s) ordered today   Imaging Orders  No imaging studies ordered today   Referral Orders  No referral(s) requested today   Interventional management options: Planned, scheduled, and/or pending:   NOTE:  ELIQUIS anticoagulation due to A-fib. (Stop: 3 days) (Restart: 6 Hrs) Diagnostic bilateral lumbar facet block #1 under fluoroscopic guidance and IV sedation   Considering:   Diagnostic intra-articular right hip injection Diagnostic right sacroiliac joint injection possible right sacroiliac radiofrequency ablation Diagnostic right lumbar epidural steroid injection Diagnostic bilateral lumbar facet nerve block Possible bilateral lumbar facet radiofrequency ablation Diagnostic bilateral intra-articular knee injection Diagnostic bilateral Hyalgan series   Palliative PRN treatment(s):   Palliative right intra-articular hip joint injection under fluoroscopic guidance and IV sedation   Provider-requested follow-up: Return for (Blood-thinner Protocol), Procedure (w/ sedation): (B) L-FCT BLK #1.  No future appointments. Primary Care Physician: [name], FNP Location: [facility] Note by: Gaspar Cola, MD Date: 04/25/2018; Time: 1:45 PM

## 2018-04-25 ENCOUNTER — Encounter: Payer: Self-pay | Admitting: Pain Medicine

## 2018-04-25 ENCOUNTER — Other Ambulatory Visit: Payer: Self-pay

## 2018-04-25 ENCOUNTER — Ambulatory Visit: Payer: Medicare Other | Attending: Pain Medicine | Admitting: Pain Medicine

## 2018-04-25 VITALS — BP 119/64 | HR 59 | Temp 98.4°F | Ht 65.0 in | Wt 319.0 lb

## 2018-04-25 DIAGNOSIS — M549 Dorsalgia, unspecified: Secondary | ICD-10-CM | POA: Insufficient documentation

## 2018-04-25 DIAGNOSIS — F419 Anxiety disorder, unspecified: Secondary | ICD-10-CM | POA: Insufficient documentation

## 2018-04-25 DIAGNOSIS — Z79891 Long term (current) use of opiate analgesic: Secondary | ICD-10-CM | POA: Insufficient documentation

## 2018-04-25 DIAGNOSIS — M5441 Lumbago with sciatica, right side: Secondary | ICD-10-CM | POA: Diagnosis not present

## 2018-04-25 DIAGNOSIS — Z888 Allergy status to other drugs, medicaments and biological substances status: Secondary | ICD-10-CM | POA: Insufficient documentation

## 2018-04-25 DIAGNOSIS — Z7901 Long term (current) use of anticoagulants: Secondary | ICD-10-CM | POA: Diagnosis not present

## 2018-04-25 DIAGNOSIS — I110 Hypertensive heart disease with heart failure: Secondary | ICD-10-CM | POA: Diagnosis not present

## 2018-04-25 DIAGNOSIS — K219 Gastro-esophageal reflux disease without esophagitis: Secondary | ICD-10-CM | POA: Insufficient documentation

## 2018-04-25 DIAGNOSIS — I509 Heart failure, unspecified: Secondary | ICD-10-CM | POA: Insufficient documentation

## 2018-04-25 DIAGNOSIS — R42 Dizziness and giddiness: Secondary | ICD-10-CM | POA: Diagnosis not present

## 2018-04-25 DIAGNOSIS — Z87891 Personal history of nicotine dependence: Secondary | ICD-10-CM | POA: Insufficient documentation

## 2018-04-25 DIAGNOSIS — M961 Postlaminectomy syndrome, not elsewhere classified: Secondary | ICD-10-CM

## 2018-04-25 DIAGNOSIS — R0789 Other chest pain: Secondary | ICD-10-CM | POA: Diagnosis not present

## 2018-04-25 DIAGNOSIS — R1031 Right lower quadrant pain: Secondary | ICD-10-CM | POA: Diagnosis not present

## 2018-04-25 DIAGNOSIS — F329 Major depressive disorder, single episode, unspecified: Secondary | ICD-10-CM | POA: Insufficient documentation

## 2018-04-25 DIAGNOSIS — M25551 Pain in right hip: Secondary | ICD-10-CM

## 2018-04-25 DIAGNOSIS — E876 Hypokalemia: Secondary | ICD-10-CM | POA: Diagnosis not present

## 2018-04-25 DIAGNOSIS — E782 Mixed hyperlipidemia: Secondary | ICD-10-CM | POA: Diagnosis not present

## 2018-04-25 DIAGNOSIS — G8929 Other chronic pain: Secondary | ICD-10-CM

## 2018-04-25 DIAGNOSIS — Z6841 Body Mass Index (BMI) 40.0 and over, adult: Secondary | ICD-10-CM | POA: Diagnosis not present

## 2018-04-25 DIAGNOSIS — Z885 Allergy status to narcotic agent status: Secondary | ICD-10-CM | POA: Diagnosis not present

## 2018-04-25 DIAGNOSIS — G894 Chronic pain syndrome: Secondary | ICD-10-CM | POA: Diagnosis not present

## 2018-04-25 DIAGNOSIS — Z79899 Other long term (current) drug therapy: Secondary | ICD-10-CM | POA: Diagnosis not present

## 2018-04-25 DIAGNOSIS — M79672 Pain in left foot: Secondary | ICD-10-CM | POA: Diagnosis not present

## 2018-04-25 DIAGNOSIS — Z9889 Other specified postprocedural states: Secondary | ICD-10-CM | POA: Insufficient documentation

## 2018-04-25 DIAGNOSIS — M47816 Spondylosis without myelopathy or radiculopathy, lumbar region: Secondary | ICD-10-CM

## 2018-04-25 DIAGNOSIS — M533 Sacrococcygeal disorders, not elsewhere classified: Secondary | ICD-10-CM | POA: Diagnosis not present

## 2018-04-25 DIAGNOSIS — M79604 Pain in right leg: Secondary | ICD-10-CM

## 2018-04-25 DIAGNOSIS — M25512 Pain in left shoulder: Secondary | ICD-10-CM | POA: Insufficient documentation

## 2018-04-25 DIAGNOSIS — M1611 Unilateral primary osteoarthritis, right hip: Secondary | ICD-10-CM | POA: Insufficient documentation

## 2018-04-25 DIAGNOSIS — Z90710 Acquired absence of both cervix and uterus: Secondary | ICD-10-CM | POA: Insufficient documentation

## 2018-04-25 NOTE — Patient Instructions (Signed)
____________________________________________________________________________________________  Pain Scale  Introduction: The pain score used by this practice is the Verbal Numerical Rating Scale (VNRS-11). This is an 11-point scale. It is for adults and children 10 years or older. There are significant differences in how the pain score is reported, used, and applied. Forget everything you learned in the past and learn this scoring system.  General Information: The scale should reflect your current level of pain. Unless you are specifically asked for the level of your worst pain, or your average pain. If you are asked for one of these two, then it should be understood that it is over the past 24 hours.  Basic Activities of Daily Living (ADL): Personal hygiene, dressing, eating, transferring, and using restroom.  Instructions: Most patients tend to report their level of pain as a combination of two factors, their physical pain and their psychosocial pain. This last one is also known as "suffering" and it is reflection of how physical pain affects you socially and psychologically. From now on, report them separately. From this point on, when asked to report your pain level, report only your physical pain. Use the following table for reference.  Pain Clinic Pain Levels (0-5/10)  Pain Level Score  Description  No Pain 0   Mild pain 1 Nagging, annoying, but does not interfere with basic activities of daily living (ADL). Patients are able to eat, bathe, get dressed, toileting (being able to get on and off the toilet and perform personal hygiene functions), transfer (move in and out of bed or a chair without assistance), and maintain continence (able to control bladder and bowel functions). Blood pressure and heart rate are unaffected. A normal heart rate for a healthy adult ranges from 60 to 100 bpm (beats per minute).   Mild to moderate pain 2 Noticeable and distracting. Impossible to hide from other  people. More frequent flare-ups. Still possible to adapt and function close to normal. It can be very annoying and may have occasional stronger flare-ups. With discipline, patients may get used to it and adapt.   Moderate pain 3 Interferes significantly with activities of daily living (ADL). It becomes difficult to feed, bathe, get dressed, get on and off the toilet or to perform personal hygiene functions. Difficult to get in and out of bed or a chair without assistance. Very distracting. With effort, it can be ignored when deeply involved in activities.   Moderately severe pain 4 Impossible to ignore for more than a few minutes. With effort, patients may still be able to manage work or participate in some social activities. Very difficult to concentrate. Signs of autonomic nervous system discharge are evident: dilated pupils (mydriasis); mild sweating (diaphoresis); sleep interference. Heart rate becomes elevated (>115 bpm). Diastolic blood pressure (lower number) rises above 100 mmHg. Patients find relief in laying down and not moving.   Severe pain 5 Intense and extremely unpleasant. Associated with frowning face and frequent crying. Pain overwhelms the senses.  Ability to do any activity or maintain social relationships becomes significantly limited. Conversation becomes difficult. Pacing back and forth is common, as getting into a comfortable position is nearly impossible. Pain wakes you up from deep sleep. Physical signs will be obvious: pupillary dilation; increased sweating; goosebumps; brisk reflexes; cold, clammy hands and feet; nausea, vomiting or dry heaves; loss of appetite; significant sleep disturbance with inability to fall asleep or to remain asleep. When persistent, significant weight loss is observed due to the complete loss of appetite and sleep deprivation.  Blood   pressure and heart rate becomes significantly elevated. Caution: If elevated blood pressure triggers a pounding headache  associated with blurred vision, then the patient should immediately seek attention at an urgent or emergency care unit, as these may be signs of an impending stroke.    Emergency Department Pain Levels (6-10/10)  Emergency Room Pain 6 Severely limiting. Requires emergency care and should not be seen or managed at an outpatient pain management facility. Communication becomes difficult and requires great effort. Assistance to reach the emergency department may be required. Facial flushing and profuse sweating along with potentially dangerous increases in heart rate and blood pressure will be evident.   Distressing pain 7 Self-care is very difficult. Assistance is required to transport, or use restroom. Assistance to reach the emergency department will be required. Tasks requiring coordination, such as bathing and getting dressed become very difficult.   Disabling pain 8 Self-care is no longer possible. At this level, pain is disabling. The individual is unable to do even the most "basic" activities such as walking, eating, bathing, dressing, transferring to a bed, or toileting. Fine motor skills are lost. It is difficult to think clearly.   Incapacitating pain 9 Pain becomes incapacitating. Thought processing is no longer possible. Difficult to remember your own name. Control of movement and coordination are lost.   The worst pain imaginable 10 At this level, most patients pass out from pain. When this level is reached, collapse of the autonomic nervous system occurs, leading to a sudden drop in blood pressure and heart rate. This in turn results in a temporary and dramatic drop in blood flow to the brain, leading to a loss of consciousness. Fainting is one of the body's self defense mechanisms. Passing out puts the brain in a calmed state and causes it to shut down for a while, in order to begin the healing process.    Summary: 1. Refer to this scale when providing us with your pain level. 2. Be  accurate and careful when reporting your pain level. This will help with your care. 3. Over-reporting your pain level will lead to loss of credibility. 4. Even a level of 1/10 means that there is pain and will be treated at our facility. 5. High, inaccurate reporting will be documented as "Symptom Exaggeration", leading to loss of credibility and suspicions of possible secondary gains such as obtaining more narcotics, or wanting to appear disabled, for fraudulent reasons. 6. Only pain levels of 5 or below will be seen at our facility. 7. Pain levels of 6 and above will be sent to the Emergency Department and the appointment cancelled. ____________________________________________________________________________________________   ____________________________________________________________________________________________  Preparing for Procedure with Sedation  Instructions: . Oral Intake: Do not eat or drink anything for at least 8 hours prior to your procedure. . Transportation: Public transportation is not allowed. Bring an adult driver. The driver must be physically present in our waiting room before any procedure can be started. . Physical Assistance: Bring an adult physically capable of assisting you, in the event you need help. This adult should keep you company at home for at least 6 hours after the procedure. . Blood Pressure Medicine: Take your blood pressure medicine with a sip of water the morning of the procedure. . Blood thinners: Notify our staff if you are taking any blood thinners. Depending on which one you take, there will be specific instructions on how and when to stop it. . Diabetics on insulin: Notify the staff so that you can be   scheduled 1st case in the morning. If your diabetes requires high dose insulin, take only  of your normal insulin dose the morning of the procedure and notify the staff that you have done so. . Preventing infections: Shower with an antibacterial soap  the morning of your procedure. . Build-up your immune system: Take 1000 mg of Vitamin C with every meal (3 times a day) the day prior to your procedure. Marland Kitchen Antibiotics: Inform the staff if you have a condition or reason that requires you to take antibiotics before dental procedures. . Pregnancy: If you are pregnant, call and cancel the procedure. . Sickness: If you have a cold, fever, or any active infections, call and cancel the procedure. . Arrival: You must be in the facility at least 30 minutes prior to your scheduled procedure. . Children: Do not bring children with you. . Dress appropriately: Bring dark clothing that you would not mind if they get stained. . Valuables: Do not bring any jewelry or valuables.  Procedure appointments are reserved for interventional treatments only. Marland Kitchen No Prescription Refills. . No medication changes will be discussed during procedure appointments. . No disability issues will be discussed.  Reasons to call and reschedule or cancel your procedure: (Following these recommendations will minimize the risk of a serious complication.) . Surgeries: Avoid having procedures within 2 weeks of any surgery. (Avoid for 2 weeks before or after any surgery). . Flu Shots: Avoid having procedures within 2 weeks of a flu shots or . (Avoid for 2 weeks before or after immunizations). . Barium: Avoid having a procedure within 7-10 days after having had a radiological study involving the use of radiological contrast. (Myelograms, Barium swallow or enema study). . Heart attacks: Avoid any elective procedures or surgeries for the initial 6 months after a "Myocardial Infarction" (Heart Attack). . Blood thinners: It is imperative that you stop these medications before procedures. Let us know if you if you take any blood thinner.  . Infection: Avoid procedures during or within two weeks of an infection (including chest colds or gastrointestinal problems). Symptoms associated with  infections include: Localized redness, fever, chills, night sweats or profuse sweating, burning sensation when voiding, cough, congestion, stuffiness, runny nose, sore throat, diarrhea, nausea, vomiting, cold or Flu symptoms, recent or current infections. It is specially important if the infection is over the area that we intend to treat. Marland Kitchen Heart and lung problems: Symptoms that may suggest an active cardiopulmonary problem include: cough, chest pain, breathing difficulties or shortness of breath, dizziness, ankle swelling, uncontrolled high or unusually low blood pressure, and/or palpitations. If you are experiencing any of these symptoms, cancel your procedure and contact your primary care physician for an evaluation.  Remember:  Regular Business hours are:  Monday to Thursday 8:00 AM to 4:00 PM  Provider's Schedule: Milinda Pointer, MD:  Procedure days: Tuesday and Thursday 7:30 AM to 4:00 PM  Gillis Santa, MD:  Procedure days: Monday and Wednesday 7:30 AM to 4:00 PM ____________________________________________________________________________________________   ____________________________________________________________________________________________  Blood Thinners  Recommended Time Interval Before and After Neuraxial Block or Catheter Removal  Drug (Generic) Brand Name Time Before Time After Comments  Abciximab Reopro 15 days 2 hours   Alteplase Activase 10 days 10 days   Apixaban Eliquis 3 days 6 hours   Aspirin > 325 mg Goody Powders/Excedrin 11 days  (Usually not stopped)  Aspirin ? 81 mg  7 days  (Usually not stopped)  Cholesterol Medication Lipitor 4 days    Cilostazol Pletal  3 days 5 hours   Clopidogrel Plavix 7-10 days 2 hours   Dabigatran Pradaxa 5 days 6 hours   Delteparin Fragmin 24 hours 4 hours   Dipyridamole + ASA Aggrenox 11days 2 hours   Enoxaparin  Lovenox 24 hours 4 hours   Eptifibatide Integrillin 8 hours 2 hours   Fish oil  4 days    Fondaparinux   Arixtra 72 hours 12 hours   Garlic supplements  7 days    Ginkgo biloba  36 hours    Ginseng  24 hours    Heparin (IV)  4 hours 2 hours   Heparin ([place]on)  12 hours 2 hours   Hydroxychloroquine Plaquenil 11 days    LMW Heparin  24 hours    LMWH  24 hours    NSAIDs  3 days  (Usually not stopped)  Prasugrel Effient 7-10 days 6 hours   Reteplase Retavase 10 days 10 days   Rivaroxaban Xarelto 3 days 6 hours   Streptokinase Streptase 10 days 10 days   Tenecteplase TNKase 10 days 10 days   Thrombolytics  10 days  10 days Avoid x 10 days after inj.  Ticagrelor Brilinta 5-7 days 6 hours   Ticlodipine Ticlid 10-14 days 2 hours   Tinzaparin Innohep 24 hours 4 hours   Tirofiban Aggrastat 8 hours 2 hours   Vitamin E  4 days    Warfarin Coumadin 5 days 2 hours   ____________________________________________________________________________________________  ____________________________________________________________________________________________  General Risks and Possible Complications  Patient Responsibilities: It is important that you read this as it is part of your informed consent. It is our duty to inform you of the risks and possible complications associated with treatments offered to you. It is your responsibility as a patient to read this and to ask questions about anything that is not clear or that you believe was not covered in this document.  Patient's Rights: You have the right to refuse treatment. You also have the right to change your mind, even after initially having agreed to have the treatment done. However, under this last option, if you wait until the last second to change your mind, you may be charged for the materials used up to that point.  Introduction: Medicine is not an Chief Strategy Officer. Everything in Medicine, including the lack of treatment(s), carries the potential for danger, harm, or loss (which is by definition: Risk). In Medicine, a complication is a secondary problem,  condition, or disease that can aggravate an already existing one. All treatments carry the risk of possible complications. The fact that a side effects or complications occurs, does not imply that the treatment was conducted incorrectly. It must be clearly understood that these can happen even when everything is done following the highest safety standards.  No treatment: You can choose not to proceed with the proposed treatment alternative. The "PRO(s)" would include: avoiding the risk of complications associated with the therapy. The "CON(s)" would include: not getting any of the treatment benefits. These benefits fall under one of three categories: diagnostic; therapeutic; and/or palliative. Diagnostic benefits include: getting information which can ultimately lead to improvement of the disease or symptom(s). Therapeutic benefits are those associated with the successful treatment of the disease. Finally, palliative benefits are those related to the decrease of the primary symptoms, without necessarily curing the condition (example: decreasing the pain from a flare-up of a chronic condition, such as incurable terminal cancer).  General Risks and Complications: These are associated to most interventional treatments. They can  occur alone, or in combination. They fall under one of the following six (6) categories: no benefit or worsening of symptoms; bleeding; infection; nerve damage; allergic reactions; and/or death. 1. No benefits or worsening of symptoms: In Medicine there are no guarantees, only probabilities. No healthcare provider can ever guarantee that a medical treatment will work, they can only state the probability that it may. Furthermore, there is always the possibility that the condition may worsen, either directly, or indirectly, as a consequence of the treatment. 2. Bleeding: This is more common if the patient is taking a blood thinner, either prescription or over the counter (example: Goody  Powders, Fish oil, Aspirin, Garlic, etc.), or if suffering a condition associated with impaired coagulation (example: Hemophilia, cirrhosis of the liver, low platelet counts, etc.). However, even if you do not have one on these, it can still happen. If you have any of these conditions, or take one of these drugs, make sure to notify your treating physician. 3. Infection: This is more common in patients with a compromised immune system, either due to disease (example: diabetes, cancer, human immunodeficiency virus [HIV], etc.), or due to medications or treatments (example: therapies used to treat cancer and rheumatological diseases). However, even if you do not have one on these, it can still happen. If you have any of these conditions, or take one of these drugs, make sure to notify your treating physician. 4. Nerve Damage: This is more common when the treatment is an invasive one, but it can also happen with the use of medications, such as those used in the treatment of cancer. The damage can occur to small secondary nerves, or to large primary ones, such as those in the spinal cord and brain. This damage may be temporary or permanent and it may lead to impairments that can range from temporary numbness to permanent paralysis and/or brain death. 5. Allergic Reactions: Any time a substance or material comes in contact with our body, there is the possibility of an allergic reaction. These can range from a mild skin rash (contact dermatitis) to a severe systemic reaction (anaphylactic reaction), which can result in death. 6. Death: In general, any medical intervention can result in death, most of the time due to an unforeseen complication. ____________________________________________________________________________________________

## 2018-04-25 NOTE — Telephone Encounter (Signed)
  This encounter was created in error - please disregard. This encounter was created in error - please disregard. This encounter was created in error - please disregard. This encounter was created in error - please disregard. This encounter was created in error - please disregard. This encounter was created in error - please disregard.

## 2018-04-30 ENCOUNTER — Other Ambulatory Visit: Payer: Self-pay | Admitting: Psychiatry

## 2018-05-01 ENCOUNTER — Encounter: Payer: Self-pay | Admitting: Anesthesiology

## 2018-05-02 ENCOUNTER — Encounter: Admission: RE | Payer: Self-pay | Source: Ambulatory Visit

## 2018-05-02 ENCOUNTER — Ambulatory Visit: Admission: RE | Admit: 2018-05-02 | Payer: Medicare Other | Source: Ambulatory Visit | Admitting: Gastroenterology

## 2018-05-02 DIAGNOSIS — Z1211 Encounter for screening for malignant neoplasm of colon: Secondary | ICD-10-CM

## 2018-05-02 SURGERY — COLONOSCOPY WITH PROPOFOL
Anesthesia: General

## 2018-05-03 ENCOUNTER — Ambulatory Visit
Admission: RE | Admit: 2018-05-03 | Discharge: 2018-05-03 | Disposition: A | Discharge status: Home / Self Care | Payer: Medicare Other | Source: Ambulatory Visit | Attending: Pain Medicine | Admitting: Pain Medicine

## 2018-05-03 ENCOUNTER — Other Ambulatory Visit: Payer: Self-pay

## 2018-05-03 ENCOUNTER — Encounter: Payer: Self-pay | Admitting: Pain Medicine

## 2018-05-03 ENCOUNTER — Ambulatory Visit (HOSPITAL_BASED_OUTPATIENT_CLINIC_OR_DEPARTMENT_OTHER): Payer: Medicare Other | Admitting: Pain Medicine

## 2018-05-03 VITALS — BP 156/66 | HR 50 | Temp 97.0°F | Resp 13 | Ht 65.0 in | Wt 318.0 lb

## 2018-05-03 DIAGNOSIS — M545 Low back pain, unspecified: Secondary | ICD-10-CM

## 2018-05-03 DIAGNOSIS — G8929 Other chronic pain: Secondary | ICD-10-CM | POA: Insufficient documentation

## 2018-05-03 DIAGNOSIS — Z91040 Latex allergy status: Secondary | ICD-10-CM | POA: Diagnosis not present

## 2018-05-03 DIAGNOSIS — Z885 Allergy status to narcotic agent status: Secondary | ICD-10-CM | POA: Diagnosis not present

## 2018-05-03 DIAGNOSIS — M47817 Spondylosis without myelopathy or radiculopathy, lumbosacral region: Secondary | ICD-10-CM | POA: Diagnosis not present

## 2018-05-03 DIAGNOSIS — M48061 Spinal stenosis, lumbar region without neurogenic claudication: Secondary | ICD-10-CM | POA: Insufficient documentation

## 2018-05-03 DIAGNOSIS — M47816 Spondylosis without myelopathy or radiculopathy, lumbar region: Secondary | ICD-10-CM | POA: Insufficient documentation

## 2018-05-03 DIAGNOSIS — Z7901 Long term (current) use of anticoagulants: Secondary | ICD-10-CM | POA: Diagnosis not present

## 2018-05-03 DIAGNOSIS — G629 Polyneuropathy, unspecified: Secondary | ICD-10-CM

## 2018-05-03 DIAGNOSIS — G2581 Restless legs syndrome: Secondary | ICD-10-CM

## 2018-05-03 DIAGNOSIS — Z888 Allergy status to other drugs, medicaments and biological substances status: Secondary | ICD-10-CM | POA: Insufficient documentation

## 2018-05-03 DIAGNOSIS — R937 Abnormal findings on diagnostic imaging of other parts of musculoskeletal system: Secondary | ICD-10-CM | POA: Insufficient documentation

## 2018-05-03 MED ORDER — ROPIVACAINE HCL 2 MG/ML IJ SOLN
18.0000 mL | Freq: Once | INTRAMUSCULAR | Status: AC
  Administered 2018-05-03: 18 mL via PERINEURAL
  Filled 2018-05-03: qty 20

## 2018-05-03 MED ORDER — TRIAMCINOLONE ACETONIDE 40 MG/ML IJ SUSP
80.0000 mg | Freq: Once | INTRAMUSCULAR | Status: AC
  Administered 2018-05-03: 80 mg
  Filled 2018-05-03: qty 2

## 2018-05-03 MED ORDER — MIDAZOLAM HCL 5 MG/5ML IJ SOLN
1.0000 mg | INTRAMUSCULAR | Status: DC | PRN
  Administered 2018-05-03: 2 mg via INTRAVENOUS
  Filled 2018-05-03: qty 5

## 2018-05-03 MED ORDER — FENTANYL CITRATE (PF) 100 MCG/2ML IJ SOLN
25.0000 ug | INTRAMUSCULAR | Status: DC | PRN
  Administered 2018-05-03: 50 ug via INTRAVENOUS
  Filled 2018-05-03: qty 2

## 2018-05-03 MED ORDER — LACTATED RINGERS IV SOLN: 1000.0000 mL | Freq: Once | INTRAVENOUS | Status: DC

## 2018-05-03 MED ORDER — LIDOCAINE HCL 2 % IJ SOLN
20.0000 mL | Freq: Once | INTRAMUSCULAR | Status: AC
  Administered 2018-05-03: 400 mg
  Filled 2018-05-03: qty 40

## 2018-05-03 MED ORDER — GABAPENTIN 400 MG PO CAPS: 400.0000 mg | ORAL_CAPSULE | Freq: Two times a day (BID) | ORAL | 1 refills | Status: DC

## 2018-05-03 NOTE — Progress Notes (Signed)
Patient's Name: Amber Barnes  MRN: [id number]  Referring Provider: [name], FNP  DOB: 07/23/1950  PCP: [name], FNP  DOS: 05/03/2018  Note by: Gaspar Cola, MD  Service setting: Ambulatory outpatient  Specialty: Interventional Pain Management  Patient type: Established  Location: [facility] (AMB) Pain Management Facility  Visit type: Interventional Procedure   Primary Reason for Visit: Interventional Pain Management Treatment. CC: Back Pain (low)  Procedure:          Anesthesia, Analgesia, Anxiolysis:  Type: Lumbar Facet, Medial Branch Block(s) #1  Primary Purpose: Diagnostic Region: Posterolateral Lumbosacral Spine Level: L2, L3, L4, L5, & S1 Medial Branch Level(s). Injecting these levels blocks the L3-4, L4-5, and L5-S1 lumbar facet joints. Laterality: Bilateral  Type: Moderate (Conscious) Sedation combined with Local Anesthesia Indication(s): Analgesia and Anxiety Route: Intravenous (IV) IV Access: Secured Sedation: Meaningful verbal contact was maintained at all times during the procedure  Local Anesthetic: Lidocaine 1-2%  Position: Prone   Indications: 1. Spondylosis without myelopathy or radiculopathy, lumbosacral region   2. Lumbar facet syndrome (Bilateral) (R>L)   3. Lumbar facet arthropathy (Bilateral) (L>R)   4. Chronic low back pain (Fourth Area of Pain) (Bilateral) (R>L)   5. Abnormal MRI, lumbar spine   6. Chronic anticoagulation (Eliquis)   7. Latex precautions, history of latex allergy    Pain Score: Pre-procedure: 9 /10 Post-procedure: 0-No pain/10  Pre-op Assessment:  Amber Barnes is a 67 y.o. (year old), female patient, seen today for interventional treatment. She  has a past surgical history that includes Thyroidectomy; Colonoscopy; Esophagogastroduodenoscopy; Abdominal hysterectomy; Achilles tendon surgery; Back surgery; CARDIOVERSION (N/A, 09/07/2016); CARDIOVERSION (N/A, 10/19/2016); and CARDIOVERSION (N/A, 12/14/2016). Amber Barnes has a  current medication list which includes the following prescription(s): amiodarone, b complex vitamins, bisoprolol, calcium gluconate, vitamin d3, cyclobenzaprine, eliquis, esomeprazole, fluvoxamine, furosemide, gabapentin, hydralazine, hydroxyzine, klor-con 10, losartan, meclizine, multivitamin, oxymetazoline, ranolazine, rizatriptan, rosuvastatin, trazodone, tramadol, and tramadol, and the following Facility-Administered Medications: fentanyl, lactated ringers, and midazolam. Her primarily concern today is the Back Pain (low)  Initial Vital Signs:  Pulse/HCG Rate: (!) 50ECG Heart Rate: (!) 51 Temp: 97.7 F (36.5 C) Resp: 18 BP: 127/65 SpO2: 96 %  BMI: Estimated body mass index is 52.92 kg/m as calculated from the following:   Height as of this encounter: 5\' 5"  (1.651 m).   Weight as of this encounter: 318 lb (144.2 kg).  Risk Assessment: Allergies: Reviewed. She is allergic to prednisone; hydrocodone-acetaminophen; oxycodone; tramadol; adhesive [tape]; and latex.  Allergy Precautions: Latex-free protocol activated Coagulopathies: Reviewed. None identified.  Blood-thinner therapy: None at this time Active Infection(s): Reviewed. None identified. Amber Barnes is afebrile  Site Confirmation: Amber Barnes was asked to confirm the procedure and laterality before marking the site Procedure checklist: Completed Consent: Before the procedure and under the influence of no sedative(s), amnesic(s), or anxiolytics, the patient was informed of the treatment options, risks and possible complications. To fulfill our ethical and legal obligations, as recommended by the American Medical Association's Code of Ethics, I have informed the patient of my clinical impression; the nature and purpose of the treatment or procedure; the risks, benefits, and possible complications of the intervention; the alternatives, including doing nothing; the risk(s) and benefit(s) of the alternative treatment(s) or procedure(s); and the  risk(s) and benefit(s) of doing nothing. The patient was provided information about the general risks and possible complications associated with the procedure. These may include, but are not limited to: failure to achieve desired goals, infection, bleeding, organ or  nerve damage, allergic reactions, paralysis, and death. In addition, the patient was informed of those risks and complications associated to Spine-related procedures, such as failure to decrease pain; infection (i.e.: Meningitis, epidural or intraspinal abscess); bleeding (i.e.: epidural hematoma, subarachnoid hemorrhage, or any other type of intraspinal or peri-dural bleeding); organ or nerve damage (i.e.: Any type of peripheral nerve, nerve root, or spinal cord injury) with subsequent damage to sensory, motor, and/or autonomic systems, resulting in permanent pain, numbness, and/or weakness of one or several areas of the body; allergic reactions; (i.e.: anaphylactic reaction); and/or death. Furthermore, the patient was informed of those risks and complications associated with the medications. These include, but are not limited to: allergic reactions (i.e.: anaphylactic or anaphylactoid reaction(s)); adrenal axis suppression; blood sugar elevation that in diabetics may result in ketoacidosis or comma; water retention that in patients with history of congestive heart failure may result in shortness of breath, pulmonary edema, and decompensation with resultant heart failure; weight gain; swelling or edema; medication-induced neural toxicity; particulate matter embolism and blood vessel occlusion with resultant organ, and/or nervous system infarction; and/or aseptic necrosis of one or more joints. Finally, the patient was informed that Medicine is not an exact science; therefore, there is also the possibility of unforeseen or unpredictable risks and/or possible complications that may result in a catastrophic outcome. The patient indicated having  understood very clearly. We have given the patient no guarantees and we have made no promises. Enough time was given to the patient to ask questions, all of which were answered to the patient's satisfaction. Ms. Gullikson has indicated that she wanted to continue with the procedure. Attestation: I, the ordering provider, attest that I have discussed with the patient the benefits, risks, side-effects, alternatives, likelihood of achieving goals, and potential problems during recovery for the procedure that I have provided informed consent. Date  Time: 05/03/2018  9:35 AM  Pre-Procedure Preparation:  Monitoring: As per clinic protocol. Respiration, ETCO2, SpO2, BP, heart rate and rhythm monitor placed and checked for adequate function Safety Precautions: Patient was assessed for positional comfort and pressure points before starting the procedure. Time-out: I initiated and conducted the "Time-out" before starting the procedure, as per protocol. The patient was asked to participate by confirming the accuracy of the "Time Out" information. Verification of the correct person, site, and procedure were performed and confirmed by me, the nursing staff, and the patient. "Time-out" conducted as per Joint Commission's Universal Protocol (UP.01.01.01). Time: 1055  Description of Procedure:          Laterality: Bilateral. The procedure was performed in identical fashion on both sides. Levels:  L2, L3, L4, L5, & S1 Medial Branch Level(s) Area Prepped: Posterior Lumbosacral Region Prepping solution: ChloraPrep (2% chlorhexidine gluconate and 70% isopropyl alcohol) Safety Precautions: Aspiration looking for blood return was conducted prior to all injections. At no point did we inject any substances, as a needle was being advanced. Before injecting, the patient was told to immediately notify me if she was experiencing any new onset of "ringing in the ears, or metallic taste in the mouth". No attempts were made at seeking  any paresthesias. Safe injection practices and needle disposal techniques used. Medications properly checked for expiration dates. SDV (single dose vial) medications used. After the completion of the procedure, all disposable equipment used was discarded in the proper designated medical waste containers. Local Anesthesia: Protocol guidelines were followed. The patient was positioned over the fluoroscopy table. The area was prepped in the usual manner. The time-out  was completed. The target area was identified using fluoroscopy. A 12-in long, straight, sterile hemostat was used with fluoroscopic guidance to locate the targets for each level blocked. Once located, the skin was marked with an approved surgical skin marker. Once all sites were marked, the skin (epidermis, dermis, and hypodermis), as well as deeper tissues (fat, connective tissue and muscle) were infiltrated with a small amount of a short-acting local anesthetic, loaded on a 10cc syringe with a 25G, 1.5-in  Needle. An appropriate amount of time was allowed for local anesthetics to take effect before proceeding to the next step. Local Anesthetic: Lidocaine 2.0% The unused portion of the local anesthetic was discarded in the proper designated containers. Technical explanation of process:  L2 Medial Branch Nerve Block (MBB): The target area for the L2 medial branch is at the junction of the postero-lateral aspect of the superior articular process and the superior, posterior, and medial edge of the transverse process of L3. Under fluoroscopic guidance, a Quincke needle was inserted until contact was made with os over the superior postero-lateral aspect of the pedicular shadow (target area). After negative aspiration for blood, 0.5 mL of the nerve block solution was injected without difficulty or complication. The needle was removed intact. L3 Medial Branch Nerve Block (MBB): The target area for the L3 medial branch is at the junction of the  postero-lateral aspect of the superior articular process and the superior, posterior, and medial edge of the transverse process of L4. Under fluoroscopic guidance, a Quincke needle was inserted until contact was made with os over the superior postero-lateral aspect of the pedicular shadow (target area). After negative aspiration for blood, 0.5 mL of the nerve block solution was injected without difficulty or complication. The needle was removed intact. L4 Medial Branch Nerve Block (MBB): The target area for the L4 medial branch is at the junction of the postero-lateral aspect of the superior articular process and the superior, posterior, and medial edge of the transverse process of L5. Under fluoroscopic guidance, a Quincke needle was inserted until contact was made with os over the superior postero-lateral aspect of the pedicular shadow (target area). After negative aspiration for blood, 0.5 mL of the nerve block solution was injected without difficulty or complication. The needle was removed intact. L5 Medial Branch Nerve Block (MBB): The target area for the L5 medial branch is at the junction of the postero-lateral aspect of the superior articular process and the superior, posterior, and medial edge of the sacral ala. Under fluoroscopic guidance, a Quincke needle was inserted until contact was made with os over the superior postero-lateral aspect of the pedicular shadow (target area). After negative aspiration for blood, 0.5 mL of the nerve block solution was injected without difficulty or complication. The needle was removed intact. S1 Medial Branch Nerve Block (MBB): The target area for the S1 medial branch is at the posterior and inferior 6 o'clock position of the L5-S1 facet joint. Under fluoroscopic guidance, the Quincke needle inserted for the L5 MBB was redirected until contact was made with os over the inferior and postero aspect of the sacrum, at the 6 o' clock position under the L5-S1 facet joint  (Target area). After negative aspiration for blood, 0.5 mL of the nerve block solution was injected without difficulty or complication. The needle was removed intact. Procedural Needles: 22-gauge, 3.5-inch, Quincke needles used for all levels. Nerve block solution: 0.2% PF-Ropivacaine + Triamcinolone (40 mg/mL) diluted to a final concentration of 4 mg of Triamcinolone/mL of Ropivacaine  The unused portion of the solution was discarded in the proper designated containers.  Once the entire procedure was completed, the treated area was cleaned, making sure to leave some of the prepping solution back to take advantage of its long term bactericidal properties.   Illustration of the posterior view of the lumbar spine and the posterior neural structures. Laminae of L2 through S1 are labeled. DPRL5, dorsal primary ramus of L5; DPRS1, dorsal primary ramus of S1; DPR3, dorsal primary ramus of L3; FJ, facet (zygapophyseal) joint L3-L4; I, inferior articular process of L4; LB1, lateral branch of dorsal primary ramus of L1; IAB, inferior articular branches from L3 medial branch (supplies L4-L5 facet joint); IBP, intermediate branch plexus; MB3, medial branch of dorsal primary ramus of L3; NR3, third lumbar nerve root; S, superior articular process of L5; SAB, superior articular branches from L4 (supplies L4-5 facet joint also); TP3, transverse process of L3.  Vitals:   05/03/18 1108 05/03/18 1118 05/03/18 1128 05/03/18 1138  BP: (!) 157/77 (!) 156/71 (!) 152/60 (!) 156/66  Pulse:      Resp: 14 13 12 13   Temp:  98.1 F (36.7 C)  (!) 97 F (36.1 C)  TempSrc:  Temporal  Temporal  SpO2: 97% 100% 100% 96%  Weight:      Height:         Start Time: 1055 hrs. End Time: 1106 hrs.  Imaging Guidance (Spinal):          Type of Imaging Technique: Fluoroscopy Guidance (Spinal) Indication(s): Assistance in needle guidance and placement for procedures requiring needle placement in or near specific anatomical locations  not easily accessible without such assistance. Exposure Time: Please see nurses notes. Contrast: None used. Fluoroscopic Guidance: I was personally present during the use of fluoroscopy. "Tunnel Vision Technique" used to obtain the best possible view of the target area. Parallax error corrected before commencing the procedure. "Direction-depth-direction" technique used to introduce the needle under continuous pulsed fluoroscopy. Once target was reached, antero-posterior, oblique, and lateral fluoroscopic projection used confirm needle placement in all planes. Images permanently stored in EMR. Interpretation: No contrast injected. I personally interpreted the imaging intraoperatively. Adequate needle placement confirmed in multiple planes. Permanent images saved into the patient's record.  Antibiotic Prophylaxis:   Anti-infectives (From admission, onward)   None     Indication(s): None identified  Post-operative Assessment:  Post-procedure Vital Signs:  Pulse/HCG Rate: (!) 50(!) 52 Temp: (!) 97 F (36.1 C) Resp: 13 BP: (!) 156/66 SpO2: 96 %  EBL: None  Complications: No immediate post-treatment complications observed by team, or reported by patient.  Note: The patient tolerated the entire procedure well. A repeat set of vitals were taken after the procedure and the patient was kept under observation following institutional policy, for this type of procedure. Post-procedural neurological assessment was performed, showing return to baseline, prior to discharge. The patient was provided with post-procedure discharge instructions, including a section on how to identify potential problems. Should any problems arise concerning this procedure, the patient was given instructions to immediately contact us, at any time, without hesitation. In any case, we plan to contact the patient by telephone for a follow-up status report regarding this interventional procedure.  Comments:  No additional  relevant information.  Plan of Care    Imaging Orders     DG C-Arm 1-60 Min-No Report  Procedure Orders     LUMBAR FACET(MEDIAL BRANCH NERVE BLOCK) MBNB  Medications ordered for procedure: Meds ordered this encounter  Medications  .  lidocaine (XYLOCAINE) 2 % (with pres) injection 400 mg  . midazolam (VERSED) 5 MG/5ML injection 1-2 mg    Make sure Flumazenil is available in the pyxis when using this medication. If oversedation occurs, administer 0.2 mg IV over 15 sec. If after 45 sec no response, administer 0.2 mg again over 1 min; may repeat at 1 min intervals; not to exceed 4 doses (1 mg)  . fentaNYL (SUBLIMAZE) injection 25-50 mcg    Make sure Narcan is available in the pyxis when using this medication. In the event of respiratory depression (RR< 8/min): Titrate NARCAN (naloxone) in increments of 0.1 to 0.2 mg IV at 2-3 minute intervals, until desired degree of reversal.  . lactated ringers infusion 1,000 mL  . ropivacaine (PF) 2 mg/mL (0.2%) (NAROPIN) injection 18 mL  . triamcinolone acetonide (KENALOG-40) injection 80 mg   Medications administered: We administered lidocaine, midazolam, fentaNYL, ropivacaine (PF) 2 mg/mL (0.2%), and triamcinolone acetonide.  See the medical record for exact dosing, route, and time of administration.  Disposition: Discharge home  Discharge Date & Time: 05/03/2018; 1139 hrs.   Physician-requested Follow-up: Return for post-procedure eval (2 wks), w/ Dr. Dossie Arbour.  Future Appointments  Date Time Provider [place]  05/14/2018  2:00 PM Buena Irish, [place] [facility] None  05/21/2018 10:15 AM Milinda Pointer, MD [facility] None   Primary Care Physician: [name], FNP Location: [facility] Note by: Gaspar Cola, MD Date: 05/03/2018; Time: 11:50 AM  Disclaimer:  Medicine is not an exact science. The only guarantee in medicine is that nothing is guaranteed. It is important to note that the  decision to proceed with this intervention was based on the information collected from the patient. The Data and conclusions were drawn from the patient's questionnaire, the interview, and the physical examination. Because the information was provided in large part by the patient, it cannot be guaranteed that it has not been purposely or unconsciously manipulated. Every effort has been made to obtain as much relevant data as possible for this evaluation. It is important to note that the conclusions that lead to this procedure are derived in large part from the available data. Always take into account that the treatment will also be dependent on availability of resources and existing treatment guidelines, considered by other Pain Management Practitioners as being common knowledge and practice, at the time of the intervention. For Medico-Legal purposes, it is also important to point out that variation in procedural techniques and pharmacological choices are the acceptable norm. The indications, contraindications, technique, and results of the above procedure should only be interpreted and judged by a Board-Certified Interventional Pain Specialist with extensive familiarity and expertise in the same exact procedure and technique.

## 2018-05-03 NOTE — Progress Notes (Signed)
Safety precautions to be maintained throughout the outpatient stay will include: orient to surroundings, keep bed in low position, maintain call bell within reach at all times, provide assistance with transfer out of bed and ambulation.  

## 2018-05-03 NOTE — Patient Instructions (Signed)

## 2018-05-04 ENCOUNTER — Telehealth: Payer: Self-pay

## 2018-05-04 NOTE — Telephone Encounter (Signed)
Post procedure phone call.  LM 

## 2018-05-14 ENCOUNTER — Ambulatory Visit (INDEPENDENT_AMBULATORY_CARE_PROVIDER_SITE_OTHER): Payer: Medicare Other | Admitting: Family

## 2018-05-14 ENCOUNTER — Ambulatory Visit: Payer: Medicare Other | Admitting: Psychology

## 2018-05-14 ENCOUNTER — Encounter: Payer: Self-pay | Admitting: Family

## 2018-05-14 VITALS — BP 132/82 | HR 56 | Temp 98.2°F | Resp 17

## 2018-05-14 DIAGNOSIS — F322 Major depressive disorder, single episode, severe without psychotic features: Secondary | ICD-10-CM | POA: Diagnosis not present

## 2018-05-14 DIAGNOSIS — F418 Other specified anxiety disorders: Secondary | ICD-10-CM

## 2018-05-14 DIAGNOSIS — I4819 Other persistent atrial fibrillation: Secondary | ICD-10-CM

## 2018-05-14 DIAGNOSIS — I10 Essential (primary) hypertension: Secondary | ICD-10-CM

## 2018-05-14 MED ORDER — BUPROPION HCL ER (XL) 150 MG PO TB24: ORAL_TABLET | ORAL | 3 refills | Status: DC

## 2018-05-14 NOTE — Patient Instructions (Addendum)
Stop trazodone.   Stop flexeril since not taking ( I have removed from drug list).   Wean off the luvox.  As long as tablets are scored, I would like you to take 25 mg of Luvox every day for the next week.  The following week I would like for you to take 25 mg every other day for 1 week.  The third week you may take Luvox 25 mg every third day and then stop as long as you are feeling okay.  Start  Wellbutrin. Titrate up as prescribed  Ensure you have follow up Dr [name].   This is  Dr. Lupita Dawn  example of a  "Low GI"  Diet:  It will allow you to lose 4 to 8  lbs  per month if you follow it carefully.  Your goal with exercise is a minimum of 30 minutes of aerobic exercise 5 days per week (Walking does not count once it becomes easy!)    All of the foods can be found at grocery stores and in bulk at [employer].  The Atkins protein bars and shakes are available in more varieties at [employer], [employer] and [place].     7 AM Breakfast:  Choose from the following:  Low carbohydrate Protein  Shakes (I recommend the  Premier Protein chocolate shakes,  EAS AdvantEdge "Carb Control" shakes  Or the Atkins shakes all are under 3 net carbs)     a scrambled egg/bacon/cheese burrito made with Mission's "carb balance" whole wheat tortilla  (about 10 net carbs )  Regulatory affairs officer (basically a quiche without the pastry crust) that is eaten cold and very convenient way to get your eggs.  8 carbs)  If you make your own protein shakes, avoid bananas and pineapple,  And use low carb greek yogurt or original /unsweetened almond or soy milk    Avoid cereal and bananas, oatmeal and cream of wheat and grits. They are loaded with carbohydrates!   10 AM: high protein snack:  Protein bar by Atkins (the snack size, under 200 cal, usually < 6 net carbs).    A stick of cheese:  Around 1 carb,  100 cal     Dannon Light n Fit [place] Yogurt  (80 cal, 8 carbs)  Other so called "protein bars" and Greek  yogurts tend to be loaded with carbohydrates.  Remember, in food advertising, the word "energy" is synonymous for " carbohydrate."  Lunch:   A Sandwich using the bread choices listed, Can use any  Eggs,  lunchmeat, grilled meat or canned tuna), avocado, regular mayo/mustard  and cheese.  A Salad using blue cheese, ranch,  Goddess or vinagrette,  Avoid taco shells, croutons or "confetti" and no "candied nuts" but regular nuts OK.   No pretzels, nabs  or chips.  Pickles and miniature sweet peppers are a good low carb alternative that provide a "crunch"  The bread is the only source of carbohydrate in a sandwich and  can be decreased by trying some of the attached alternatives to traditional loaf bread   Avoid "Low fat dressings, as well as Mangham dressings They are loaded with sugar!   3 PM/ Mid day  Snack:  Consider  1 ounce of  almonds, walnuts, pistachios, pecans, peanuts,  Macadamia nuts or a nut medley.  Avoid "granola and granola bars "  Mixed nuts are ok in moderation as long as there are no raisins,  cranberries or dried  fruit.   KIND bars are OK if you get the low glycemic index variety   Try the prosciutto/mozzarella cheese sticks by [employer]  In deli /backery section   High protein      6 PM  Dinner:     Meat/fowl/fish with a green salad, and either broccoli, cauliflower, green beans, spinach, brussel sprouts or  Lima beans. DO NOT BREAD THE PROTEIN!!      There is a low carb pasta by Dreamfield's that is acceptable and tastes great: only 5 digestible carbs/serving.( All grocery stores but [employer]Js carry it ) Several ready made meals are available low carb:   Try Michel Angelo's chicken piccata or chicken or eggplant parm over low carb pasta.(Lowes and BJs)   Marjory Lies Sanchez's "Carnitas" (pulled pork, no sauce,  0 carbs) or his beef pot roast to make a dinner burrito (at [employer])  Pesto over low carb pasta (bj's sells a good quality pesto in the center refrigerated  section of the deli   Try satueeing  Cheral Marker with mushroooms as a good side   [employer] makes a mashed cauliflower that tastes like mashed potatoes  Whole wheat pasta is still full of digestible carbs and  Not as low in glycemic index as Dreamfield's.   Brown rice is still rice,  So skip the rice and noodles if you eat [place] or [place] (or at least limit to 1/2 cup)  9 PM snack :   Breyer's "low carb" fudgsicle or  ice cream bar (Carb Smart line), or  Weight Watcher's ice cream bar , or another "no sugar added" ice cream;  a serving of fresh berries/cherries with whipped cream   Cheese or DANNON'S LlGHT N FIT GREEK YOGURT  8 ounces of [place] unsweetened almond/cococunut milk    Treat yourself to a parfait made with whipped cream blueberiies, walnuts and vanilla greek yogurt  Avoid bananas, pineapple, grapes  and watermelon on a regular basis because they are high in sugar.  THINK OF THEM AS DESSERT  Remember that snack Substitutions should be less than 10 NET carbs per serving and meals < 20 carbs. Remember to subtract fiber grams to get the "net carbs."  @TULLOBREADPACKAGE @

## 2018-05-14 NOTE — Assessment & Plan Note (Signed)
Well-controlled with the addition of hydralazine from emergency room visit.  No changes to regimen made today.  Will follow

## 2018-05-14 NOTE — Assessment & Plan Note (Signed)
Long discussion with patient regarding her weight and how it affects her chronic low back pain.  She has not felt Luvox has  Been particularly helpful for depression. We will do a trial of weaning off Luvox, starting Wellbutrin to see if this helps with depression, facilitate weight loss.  Currently not using trazodone as not effective for her.  Advised her to discontinue this medication.  Close follow-up.

## 2018-05-14 NOTE — Assessment & Plan Note (Signed)
Symptomatically stable.  She continue to follow with cardiology.  Will follow

## 2018-05-14 NOTE — Progress Notes (Signed)
Subjective:    Patient ID: Amber Barnes, female    DOB: Oct 25, 1950, 67 y.o.   MRN: [id number]  CC: Amber Barnes is a 67 y.o. female who presents today for follow up.   HPI: HTN- compliant with medication, added hydralazine. BP at home, 132/70.  No further cp, she suspects ED 03/2018 was related to anxiety.   Afib controlled on pacerone; On eliquis.  No palpitations.   Denies exertional chest pain or pressure, numbness or tingling radiating to left arm or jaw, palpitations, dizziness, frequent headaches, changes in vision, or shortness of breath.    Chronic LBP- back and hip pain has improved from injections. Sees naveria next week   Depression- Pain is contributing.  rarely takes trazodone. If gets stressed over eats. Unsure if luvox effecting. No longer following with Dr Shea Evans, psychiatry. No si/hi. No h/o suicide attempts.   Obesity- following more portion control. Not snacking.  interested in Medical Weight loss , however getting to [place] is hard got her. Following with Alene Mires.   No h/o seizure. No heavy alcohol.     ED 04/17/18 dizziness, HA. Troponin, CT head negative. Started her on hydralazine.  ED 03/2018 CP- cxr stable cardiomegaly. troponin negative x 2.  HISTORY:  Past Medical History:  Diagnosis Date  . Achilles tendinitis   . Acute medial meniscal tear   . Allergy   . Anxiety   . Arthritis   . Atrial fibrillation ([place])    a. new onset 07/2016; b. CHADS2VASc --> 2 (HTN, female); c. eliquis  . CHF (congestive heart failure) ([place])   . Chronic lower back pain   . COPD (chronic obstructive pulmonary disease) ([place])   . Depression   . Fibrocystic breast disease   . GERD (gastroesophageal reflux disease)   . Hyperlipidemia   . Hypertension   . Insomnia   . Lipoma   . Migraine   . Osteoarthritis    left knee  . Osteopenia   . Tendonitis   . Thyroid disease   . Vitamin D deficiency    Past Surgical History:  Procedure Laterality Date  . ABDOMINAL  HYSTERECTOMY     ovaries left  . ACHILLES TENDON SURGERY     2012,01/2017  . BACK SURGERY    . CARDIOVERSION N/A 09/07/2016   Procedure: CARDIOVERSION;  Surgeon: Minna Merritts, MD;  Location: [facility] ORS;  Service: Cardiovascular;  Laterality: N/A;  . CARDIOVERSION N/A 10/19/2016   Procedure: CARDIOVERSION;  Surgeon: Minna Merritts, MD;  Location: [facility] OR[facility];  Service: Cardiovascular;  Laterality: N/A;  . CARDIOVERSION N/A 12/14/2016   Procedure: CARDIOVERSION;  Surgeon: Minna Merritts, MD;  Location: [facility] OR[facility];  Service: Cardiovascular;  Laterality: N/A;  . COLONOSCOPY    . ESOPHAGOGASTRODUODENOSCOPY    . THYROIDECTOMY     thyroid lobectomy secondary to a benign nodule   Family History  Problem Relation Age of Onset  . Cancer Sister 23       ovarian  . Depression Sister   . Cancer Father 46       colon  . Heart disease Father   . Heart attack Father 41       died from MI  . Hyperlipidemia Father   . Hypertension Father   . Cancer Mother        leukemia  . Breast cancer Paternal Aunt        36's  . Migraines Neg Hx     Allergies: Prednisone; Hydrocodone-acetaminophen; Oxycodone; Tramadol; Adhesive [tape]; and  Latex Current Outpatient Medications on File Prior to Visit  Medication Sig Dispense Refill  . amiodarone (PACERONE) 200 MG tablet Take 1 tablet (200 mg total) by mouth daily. 90 tablet 3  . B Complex Vitamins (VITAMIN B COMPLEX PO) Take 1 tablet by mouth daily.     . bisoprolol (ZEBETA) 5 MG tablet Take 1 tablet (5 mg total) by mouth daily. 90 tablet 3  . calcium gluconate 500 MG tablet Take 500 tablets by mouth daily.    . Cholecalciferol (VITAMIN D3) 5000 units CAPS Take 5,000 Units by mouth daily.    Marland Kitchen ELIQUIS 5 MG TABS tablet TAKE 1 TABLET BY MOUTH TWICE A DAY 60 tablet 3  . esomeprazole (NEXIUM) 20 MG capsule Take 20 mg by mouth daily as needed (acid reflux).    . furosemide (LASIX) 20 MG tablet TAKE 1 TABLET (20 MG TOTAL) BY MOUTH 2 (TWO) TIMES DAILY. 180 tablet  3  . gabapentin (NEURONTIN) 400 MG capsule Take 1 capsule (400 mg total) by mouth 2 (two) times daily. 180 capsule 1  . hydrALAZINE (APRESOLINE) 25 MG tablet Take 1 tablet (25 mg total) by mouth 4 (four) times daily. 120 tablet 11  . hydrOXYzine (ATARAX/VISTARIL) 10 MG tablet Take 2 tablets (20 mg total) by mouth 2 (two) times daily as needed. 120 tablet 1  . KLOR-CON 10 10 MEQ tablet TAKE 1 TABLET (10 MEQ TOTAL) BY MOUTH 2 (TWO) TIMES DAILY. 180 tablet 0  . losartan (COZAAR) 25 MG tablet TAKE 1 TABLET BY MOUTH EVERY DAY 30 tablet 6  . meclizine (ANTIVERT) 25 MG tablet Take 1 tablet (25 mg total) by mouth 3 (three) times daily as needed for dizziness or nausea. 30 tablet 1  . Multiple Vitamin (MULTIVITAMIN) tablet Take 1 tablet by mouth daily.    Marland Kitchen oxymetazoline (AFRIN) 0.05 % nasal spray Place 1 spray into both nostrils at bedtime as needed for congestion.     . ranolazine (RANEXA) 500 MG 12 hr tablet TAKE 1 TABLET BY MOUTH TWICE A DAY 60 tablet 3  . rizatriptan (MAXALT) 10 MG tablet TAKE 1 TABLET (10 MG TOTAL) BY MOUTH AS NEEDED FOR MIGRAINE. MAY REPEAT IN 2 HOURS IF NEEDED 10 tablet 0  . rosuvastatin (CRESTOR) 20 MG tablet TAKE 1 TABLET BY MOUTH EVERY DAY 90 tablet 0   No current facility-administered medications on file prior to visit.     Social History   Tobacco Use  . Smoking status: Former Smoker    Packs/day: 0.25    Years: 10.00    Pack years: 2.50    Types: Cigarettes    Last attempt to quit: 2005    Years since quitting: 14.8  . Smokeless tobacco: Former Systems developer    Quit date: 07/11/2001  . Tobacco comment: smoked for 10 years, 1 pack every 2-3 days  Substance Use Topics  . Alcohol use: Not Currently    Comment: social, occasional  . Drug use: No    Review of Systems  Constitutional: Negative for chills and fever.  Respiratory: Negative for cough.   Cardiovascular: Negative for chest pain and palpitations.  Gastrointestinal: Negative for nausea and vomiting.    Musculoskeletal: Positive for back pain (chronic).  Psychiatric/Behavioral: Negative for sleep disturbance and suicidal ideas. The patient is nervous/anxious.       Objective:    BP 132/82 (BP Location: Left Arm, Patient Position: Sitting, Cuff Size: Large)   Pulse (!) 56   Temp 98.2 F (36.8 C) (Oral)  Resp 17   SpO2 95%  BP Readings from Last 3 Encounters:  05/14/18 132/82  05/03/18 (!) 156/66  04/25/18 119/64   Wt Readings from Last 3 Encounters:  05/03/18 (!) 318 lb (144.2 kg)  04/25/18 (!) 319 lb (144.7 kg)  04/17/18 (!) 324 lb (147 kg)    Physical Exam  Constitutional: She appears well-developed and well-nourished.  Eyes: Conjunctivae are normal.  Cardiovascular: Normal rate, regular rhythm, normal heart sounds and normal pulses.  Pulmonary/Chest: Effort normal and breath sounds normal. She has no wheezes. She has no rhonchi. She has no rales.  Neurological: She is alert.  Skin: Skin is warm and dry.  Psychiatric: She has a normal mood and affect. Her speech is normal and behavior is normal. Thought content normal.  Vitals reviewed.      Assessment & Plan:   Problem List Items Addressed This Visit      Cardiovascular and Mediastinum   Essential hypertension - Primary    Well-controlled with the addition of hydralazine from emergency room visit.  No changes to regimen made today.  Will follow      Persistent atrial fibrillation    Symptomatically stable.  She continue to follow with cardiology.  Will follow        Other   Obesity, Class III, BMI 40-49.9 (morbid obesity) ([place])    Long discussion with patient regarding her weight and how it affects her chronic low back pain.  She has not felt Luvox has  Been particularly helpful for depression. We will do a trial of weaning off Luvox, starting Wellbutrin to see if this helps with depression, facilitate weight loss.  Currently not using trazodone as not effective for her.  Advised her to discontinue this  medication.  Close follow-up.      Relevant Medications   buPROPion (WELLBUTRIN XL) 150 MG 24 hr tablet       I have discontinued [name] "Jackie"'s traZODone, fluvoxaMINE, traMADol, traMADol, and cyclobenzaprine. I am also having her start on buPROPion. Additionally, I am having her maintain her multivitamin, B Complex Vitamins (VITAMIN B COMPLEX PO), oxymetazoline, Vitamin D3, esomeprazole, calcium gluconate, amiodarone, bisoprolol, furosemide, ranolazine, losartan, rosuvastatin, hydrOXYzine, KLOR-CON 10, rizatriptan, ELIQUIS, meclizine, hydrALAZINE, and gabapentin.   Meds ordered this encounter  Medications  . buPROPion (WELLBUTRIN XL) 150 MG 24 hr tablet    Sig: Start 150 mg ER PO qam, increase after 3 days to 300 mg qam.    Dispense:  60 tablet    Refill:  3    Order Specific Question:   Supervising Provider    Answer:   Crecencio Mc [[id number]]    Return precautions given.   Risks, benefits, and alternatives of the medications and treatment plan prescribed today were discussed, and patient expressed understanding.   Education regarding symptom management and diagnosis given to patient on AVS.  Continue to follow with [name], FNP for routine health maintenance.   [name] and I agreed with plan.   [name], FNP

## 2018-05-15 ENCOUNTER — Encounter: Payer: Self-pay | Admitting: Family

## 2018-05-20 NOTE — Progress Notes (Signed)
Patient's Name: Amber Barnes  MRN: [id number]  Referring Provider: [name], FNP  DOB: [birth year]  PCP: [name], FNP  DOS: 05/21/2018  Note by: Gaspar Cola, MD  Service setting: Ambulatory outpatient  Specialty: Interventional Pain Management  Location: [facility] (AMB) Pain Management Facility    Patient type: Established   Primary Reason(s) for Visit: Encounter for post-procedure evaluation of chronic illness with mild to moderate exacerbation CC: Groin Pain (right); Back Pain (low); and Leg Pain (right, posterior to ankle)  HPI  Amber Barnes is a 67 y.o. year old, female patient, who comes today for a post-procedure evaluation. She has Obesity, Class III, BMI 40-49.9 (morbid obesity) ([place]); Heel spur; Tendonitis; SOB (shortness of breath); Morbid obesity ([place]); Mixed hyperlipidemia; Essential hypertension; Anxiety and depression; Arthritis, senescent; GERD (gastroesophageal reflux disease); Seasonal rhinitis; Routine physical examination; Dizziness; Family history of ovarian cancer; Fatty liver; Hypokalemia; Left foot pain; Atypical chest pain; Persistent atrial fibrillation; Acute pulmonary edema ([place]); Acute pain of left shoulder; OSA (obstructive sleep apnea); Restless leg; Bradycardia; Depression, recurrent ([place]); Migraine with aura and without status migrainosus, not intractable; Atherosclerosis of aorta ([place]); Olecranon bursitis of left elbow; Cervical radiculopathy; Screening mammogram, encounter for; Numbness and tingling; Chronic groin pain (Primary Area of Pain) (Right); Chronic lower extremity pain (Secondary Area of Pain) (Right); Chronic hip pain [facility] Area of Pain) (Right); Chronic low back pain (Fourth Area of Pain) (Bilateral) w/ sciatica (Right); Chronic knee pain (Bilateral); Chronic pain syndrome; Pharmacologic therapy; Disorder of skeletal system; Problems influencing health status; Lumbar facet syndrome (Bilateral) (R>L); Chronic sacroiliac joint pain  (Left); Chronic anticoagulation (Eliquis); Spondylosis without myelopathy or radiculopathy, lumbosacral region; Other specified dorsopathies, sacral and sacrococcygeal region; Osteoarthritis of hip (Right); History of allergy to latex; History of lumbar laminectomy; Failed back surgical syndrome; Chronic low back pain (Fourth Area of Pain) (Bilateral) (R>L); Abnormal MRI, lumbar spine; Lumbar facet arthropathy (Bilateral) (L>R); Lumbar lateral recess stenosis (L3-4, L4-5) (Left); Lumbar foraminal stenosis (L4-5) (Left); Lumbar central spinal stenosis (L4-5); Latex precautions, history of latex allergy; and Neurogenic pain on their problem list. Her primarily concern today is the Groin Pain (right); Back Pain (low); and Leg Pain (right, posterior to ankle)  Pain Assessment: Location: Right Groin Radiating: right anterior leg to ankle Onset: More than a month ago Duration: Chronic pain Quality: Shooting, Throbbing, Constant, Stabbing Severity: 2 /10 (subjective, self-reported pain score)  Note: Reported level is compatible with observation.                         When using our objective Pain Scale, levels between 6 and 10/10 are said to belong in an emergency room, as it progressively worsens from a 6/10, described as severely limiting, requiring emergency care not usually available at an outpatient pain management facility. At a 6/10 level, communication becomes difficult and requires great effort. Assistance to reach the emergency department may be required. Facial flushing and profuse sweating along with potentially dangerous increases in heart rate and blood pressure will be evident. Timing: Constant Modifying factors: "nothing" BP: (!) 142/72  HR: 65  Amber Barnes comes in today for post-procedure evaluation.  Further details on both, my assessment(s), as well as the proposed treatment plan, please see below.  Post-Procedure Assessment  05/03/2018 Procedure: Diagnostic bilateral lumbar facet  block #1under fluoroscopic guidance and IV sedation Pre-procedure pain score:  9/10 Post-procedure pain score: 0/10 (100% relief) Influential Factors: BMI: 52.92 kg/m Intra-procedural challenges: None observed.  Assessment challenges: None detected.              Reported side-effects: None.        Post-procedural adverse reactions or complications: None reported         Sedation: Please see nurses note. When no sedatives are used, the analgesic levels obtained are directly associated to the effectiveness of the local anesthetics. However, when sedation is provided, the level of analgesia obtained during the initial 1 hour following the intervention, is believed to be the result of a combination of factors. These factors may include, but are not limited to: 1. The effectiveness of the local anesthetics used. 2. The effects of the analgesic(s) and/or anxiolytic(s) used. 3. The degree of discomfort experienced by the patient at the time of the procedure. 4. The patients ability and reliability in recalling and recording the events. 5. The presence and influence of possible secondary gains and/or psychosocial factors. Reported result: Relief experienced during the 1st hour after the procedure: 100 % (Ultra-Short Term Relief)            Interpretative annotation: Clinically appropriate result. Analgesia during this period is likely to be Local Anesthetic and/or IV Sedative (Analgesic/Anxiolytic) related.          Effects of local anesthetic: The analgesic effects attained during this period are directly associated to the localized infiltration of local anesthetics and therefore cary significant diagnostic value as to the etiological location, or anatomical origin, of the pain. Expected duration of relief is directly dependent on the pharmacodynamics of the local anesthetic used. Long-acting (4-6 hours) anesthetics used.  Reported result: Relief during the next 4 to 6 hour after the procedure:  100 % (Short-Term Relief)            Interpretative annotation: Clinically appropriate result. Analgesia during this period is likely to be Local Anesthetic-related.          Long-term benefit: Defined as the period of time past the expected duration of local anesthetics (1 hour for short-acting and 4-6 hours for long-acting). With the possible exception of prolonged sympathetic blockade from the local anesthetics, benefits during this period are typically attributed to, or associated with, other factors such as analgesic sensory neuropraxia, antiinflammatory effects, or beneficial biochemical changes provided by agents other than the local anesthetics.  Reported result: Extended relief following procedure: 90 % (Long-Term Relief)            Interpretative annotation: Clinically possible results. Good relief. No permanent benefit expected. Inflammation plays a part in the etiology to the pain.          Current benefits: Defined as reported results that persistent at this point in time.   Analgesia: 90 %            Function: Somewhat improved ROM: Somewhat improved Interpretative annotation: Recurrence of symptoms. No permanent benefit expected. Effective diagnostic intervention.          Interpretation: Results would suggest a successful diagnostic intervention.                  Plan:  Please see "Plan of Care" for details.                Laboratory Chemistry  Inflammation Markers (CRP: Acute Phase) (ESR: Chronic Phase) Lab Results  Component Value Date   CRP 7 02/27/2018   ESRSEDRATE 33 02/27/2018                           Rheumatology Markers No results found.  Renal Markers Lab Results  Component Value Date   BUN 16 04/17/2018   CREATININE 0.76 04/17/2018   BCR 18 02/27/2018   GFRAA >60 04/17/2018   GFRNONAA >60 04/17/2018                             Hepatic Markers Lab Results  Component Value Date   AST 28 04/08/2018   ALT 28 04/08/2018   ALBUMIN 4.3 04/08/2018   HCVAB  NEGATIVE 05/12/2016                        Neuropathy Markers Lab Results  Component Value Date   VITAMINB12 787 02/27/2018   HGBA1C 5.5 04/03/2018   HIV NONREACTIVE 05/12/2016                        Hematology Parameters Lab Results  Component Value Date   INR 1.05 04/30/2017   LABPROT 13.6 04/30/2017   PLT 172 04/17/2018   HGB 14.0 04/17/2018   HCT 42.8 04/17/2018                        CV Markers Lab Results  Component Value Date   BNP 278.0 (H) 08/10/2016   TROPONINI <0.03 04/17/2018                         Note: Lab results reviewed.  Recent Imaging Results   Results for orders placed in visit on 05/03/18  DG C-Arm 1-60 Min-No Report   Narrative Fluoroscopy was utilized by the requesting physician.  No radiographic  interpretation.        Interpretation Report: Fluoroscopy was used during the procedure to assist with needle guidance. The images were interpreted intraoperatively by the requesting physician.  Meds   Current Outpatient Medications:  .  amiodarone (PACERONE) 200 MG tablet, Take 1 tablet (200 mg total) by mouth daily., Disp: 90 tablet, Rfl: 3 .  B Complex Vitamins (VITAMIN B COMPLEX PO), Take 1 tablet by mouth daily. , Disp: , Rfl:  .  bisoprolol (ZEBETA) 5 MG tablet, Take 1 tablet (5 mg total) by mouth daily., Disp: 90 tablet, Rfl: 3 .  buPROPion (WELLBUTRIN XL) 150 MG 24 hr tablet, Start 150 mg ER PO qam, increase after 3 days to 300 mg qam., Disp: 60 tablet, Rfl: 3 .  calcium gluconate 500 MG tablet, Take 500 tablets by mouth daily., Disp: , Rfl:  .  Cholecalciferol (VITAMIN D3) 5000 units CAPS, Take 5,000 Units by mouth daily., Disp: , Rfl:  .  ELIQUIS 5 MG TABS tablet, TAKE 1 TABLET BY MOUTH TWICE A DAY, Disp: 60 tablet, Rfl: 3 .  esomeprazole (NEXIUM) 20 MG capsule, Take 20 mg by mouth daily as needed (acid reflux)., Disp: , Rfl:  .  furosemide (LASIX) 20 MG tablet, TAKE 1 TABLET (20 MG TOTAL) BY MOUTH 2 (TWO) TIMES DAILY., Disp: 180 tablet,  Rfl: 3 .  hydrALAZINE (APRESOLINE) 25 MG tablet, Take 1 tablet (25 mg total) by mouth 4 (four) times daily., Disp: 120 tablet, Rfl: 11 .  hydrOXYzine (ATARAX/VISTARIL) 10 MG tablet, Take 2 tablets (20 mg total) by mouth 2 (two) times daily as needed., Disp: 120 tablet, Rfl: 1 .  KLOR-CON 10 10 MEQ tablet, TAKE 1 TABLET (10 MEQ TOTAL) BY MOUTH 2 (TWO) TIMES   DAILY., Disp: 180 tablet, Rfl: 0 .  losartan (COZAAR) 25 MG tablet, TAKE 1 TABLET BY MOUTH EVERY DAY, Disp: 30 tablet, Rfl: 6 .  meclizine (ANTIVERT) 25 MG tablet, Take 1 tablet (25 mg total) by mouth 3 (three) times daily as needed for dizziness or nausea., Disp: 30 tablet, Rfl: 1 .  Multiple Vitamin (MULTIVITAMIN) tablet, Take 1 tablet by mouth daily., Disp: , Rfl:  .  oxymetazoline (AFRIN) 0.05 % nasal spray, Place 1 spray into both nostrils at bedtime as needed for congestion. , Disp: , Rfl:  .  ranolazine (RANEXA) 500 MG 12 hr tablet, TAKE 1 TABLET BY MOUTH TWICE A DAY, Disp: 60 tablet, Rfl: 3 .  rizatriptan (MAXALT) 10 MG tablet, TAKE 1 TABLET (10 MG TOTAL) BY MOUTH AS NEEDED FOR MIGRAINE. MAY REPEAT IN 2 HOURS IF NEEDED, Disp: 10 tablet, Rfl: 0 .  rosuvastatin (CRESTOR) 20 MG tablet, TAKE 1 TABLET BY MOUTH EVERY DAY, Disp: 90 tablet, Rfl: 0 .  gabapentin (NEURONTIN) 300 MG capsule, Take 1-3 capsules (300-900 mg total) by mouth 4 (four) times daily., Disp: 360 capsule, Rfl: 2  ROS  Constitutional: Denies any fever or chills Gastrointestinal: No reported hemesis, hematochezia, vomiting, or acute GI distress Musculoskeletal: Denies any acute onset joint swelling, redness, loss of ROM, or weakness Neurological: No reported episodes of acute onset apraxia, aphasia, dysarthria, agnosia, amnesia, paralysis, loss of coordination, or loss of consciousness  Allergies  Amber Barnes is allergic to prednisone; hydrocodone-acetaminophen; oxycodone; tramadol; adhesive [tape]; and latex.  PFSH  Drug: Amber Barnes  reports that she does not use  drugs. Alcohol:  reports that she drank alcohol. Tobacco:  reports that she quit smoking about 14 years ago. Her smoking use included cigarettes. She has a 2.50 pack-year smoking history. She quit smokeless tobacco use about 16 years ago. Medical:  has a past medical history of Achilles tendinitis, Acute medial meniscal tear, Allergy, Anxiety, Arthritis, Atrial fibrillation (HCC), CHF (congestive heart failure) (HCC), Chronic lower back pain, COPD (chronic obstructive pulmonary disease) (HCC), Depression, Fibrocystic breast disease, GERD (gastroesophageal reflux disease), Hyperlipidemia, Hypertension, Insomnia, Lipoma, Migraine, Osteoarthritis, Osteopenia, Tendonitis, Thyroid disease, and Vitamin D deficiency. Surgical: Amber Barnes  has a past surgical history that includes Thyroidectomy; Colonoscopy; Esophagogastroduodenoscopy; Abdominal hysterectomy; Achilles tendon surgery; Back surgery; CARDIOVERSION (N/A, 09/07/2016); CARDIOVERSION (N/A, 10/19/2016); and CARDIOVERSION (N/A, 12/14/2016). Family: family history includes Breast cancer in her paternal aunt; Cancer in her mother; Cancer (age of onset: 54) in her sister; Cancer (age of onset: 56) in her father; Depression in her sister; Heart attack (age of onset: 60) in her father; Heart disease in her father; Hyperlipidemia in her father; Hypertension in her father.  Constitutional Exam  General appearance: Well nourished, well developed, and well hydrated. In no apparent acute distress Vitals:   05/21/18 1026  BP: (!) 142/72  Pulse: 65  Resp: 18  Temp: 98.1 F (36.7 C)  TempSrc: Oral  SpO2: 96%  Weight: (!) 318 lb (144.2 kg)  Height: 5' 5" (1.651 m)   BMI Assessment: Estimated body mass index is 52.92 kg/m as calculated from the following:   Height as of this encounter: 5' 5" (1.651 m).   Weight as of this encounter: 318 lb (144.2 kg).  BMI interpretation table: BMI level Category Range association with higher incidence of chronic pain  <18  kg/m2 Underweight   18.5-24.9 kg/m2 Ideal body weight   25-29.9 kg/m2 Overweight Increased incidence by 20%  30-34.9 kg/m2 Obese (Class I) Increased incidence by 68%    35-39.9 kg/m2 Severe obesity (Class II) Increased incidence by 136%  >40 kg/m2 Extreme obesity (Class III) Increased incidence by 254%   Patient's current BMI Ideal Body weight  Body mass index is 52.92 kg/m. Ideal body weight: 57 kg (125 lb 10.6 oz) Adjusted ideal body weight: 91.9 kg (202 lb 9.6 oz)   BMI Readings from Last 4 Encounters:  05/21/18 52.92 kg/m  05/03/18 52.92 kg/m  04/25/18 53.08 kg/m  04/17/18 53.92 kg/m   Wt Readings from Last 4 Encounters:  05/21/18 (!) 318 lb (144.2 kg)  05/03/18 (!) 318 lb (144.2 kg)  04/25/18 (!) 319 lb (144.7 kg)  04/17/18 (!) 324 lb (147 kg)  Psych/Mental status: Alert, oriented x 3 (person, place, & time)       Eyes: PERLA Respiratory: No evidence of acute respiratory distress  Cervical Spine Area Exam  Skin & Axial Inspection: No masses, redness, edema, swelling, or associated skin lesions Alignment: Symmetrical Functional ROM: Unrestricted ROM      Stability: No instability detected Muscle Tone/Strength: Functionally intact. No obvious neuro-muscular anomalies detected. Sensory (Neurological): Unimpaired Palpation: No palpable anomalies              Upper Extremity (UE) Exam    Side: Right upper extremity  Side: Left upper extremity  Skin & Extremity Inspection: Skin color, temperature, and hair growth are WNL. No peripheral edema or cyanosis. No masses, redness, swelling, asymmetry, or associated skin lesions. No contractures.  Skin & Extremity Inspection: Skin color, temperature, and hair growth are WNL. No peripheral edema or cyanosis. No masses, redness, swelling, asymmetry, or associated skin lesions. No contractures.  Functional ROM: Unrestricted ROM          Functional ROM: Unrestricted ROM          Muscle Tone/Strength: Functionally intact. No obvious  neuro-muscular anomalies detected.  Muscle Tone/Strength: Functionally intact. No obvious neuro-muscular anomalies detected.  Sensory (Neurological): Unimpaired          Sensory (Neurological): Unimpaired          Palpation: No palpable anomalies              Palpation: No palpable anomalies              Provocative Test(s):  Phalen's test: deferred Tinel's test: deferred Apley's scratch test (touch opposite shoulder):  Action 1 (Across chest): deferred Action 2 (Overhead): deferred Action 3 (LB reach): deferred   Provocative Test(s):  Phalen's test: deferred Tinel's test: deferred Apley's scratch test (touch opposite shoulder):  Action 1 (Across chest): deferred Action 2 (Overhead): deferred Action 3 (LB reach): deferred    Thoracic Spine Area Exam  Skin & Axial Inspection: No masses, redness, or swelling Alignment: Symmetrical Functional ROM: Unrestricted ROM Stability: No instability detected Muscle Tone/Strength: Functionally intact. No obvious neuro-muscular anomalies detected. Sensory (Neurological): Unimpaired Muscle strength & Tone: No palpable anomalies  Lumbar Spine Area Exam  Skin & Axial Inspection: No masses, redness, or swelling Alignment: Symmetrical Functional ROM: Unrestricted ROM       Stability: No instability detected Muscle Tone/Strength: Functionally intact. No obvious neuro-muscular anomalies detected. Sensory (Neurological): Unimpaired Palpation: No palpable anomalies       Provocative Tests: Hyperextension/rotation test: deferred today       Lumbar quadrant test (Kemp's test): deferred today       Lateral bending test: deferred today       Patrick's Maneuver: deferred today                     FABER test: deferred today                   S-I anterior distraction/compression test: deferred today         S-I lateral compression test: deferred today         S-I Thigh-thrust test: deferred today         S-I Gaenslen's test: deferred today           Gait & Posture Assessment  Ambulation: Patient ambulates using a walker Gait: Significantly limited. Dependent on assistive device to ambulate Posture: Antalgic   Lower Extremity Exam    Side: Right lower extremity  Side: Left lower extremity  Stability: No instability observed          Stability: No instability observed          Skin & Extremity Inspection: Skin color, temperature, and hair growth are WNL. No peripheral edema or cyanosis. No masses, redness, swelling, asymmetry, or associated skin lesions. No contractures.  Skin & Extremity Inspection: Skin color, temperature, and hair growth are WNL. No peripheral edema or cyanosis. No masses, redness, swelling, asymmetry, or associated skin lesions. No contractures.  Functional ROM: Decreased ROM for hip joint          Functional ROM: Unrestricted ROM                  Muscle Tone/Strength: Functionally intact. No obvious neuro-muscular anomalies detected.  Muscle Tone/Strength: Functionally intact. No obvious neuro-muscular anomalies detected.  Sensory (Neurological): Articular pain pattern  Sensory (Neurological): Unimpaired  Palpation: No palpable anomalies  Palpation: No palpable anomalies   Assessment  Primary Diagnosis & Pertinent Problem List: The primary encounter diagnosis was Chronic groin pain (Primary Area of Pain) (Right). Diagnoses of Chronic low back pain (Fourth Area of Pain) (Bilateral) (R>L), Chronic hip pain (Tertiary Area of Pain) (Right), Osteoarthritis of hip (Right), Spondylosis without myelopathy or radiculopathy, lumbosacral region, Lumbar facet syndrome (Bilateral) (R>L), Lumbar facet arthropathy (Bilateral) (L>R), Chronic anticoagulation (Eliquis), Neurogenic pain, and Restless leg were also pertinent to this visit.  Status Diagnosis  Recurring Improved Worsening 1. Chronic groin pain (Primary Area of Pain) (Right)   2. Chronic low back pain (Fourth Area of Pain) (Bilateral) (R>L)   3. Chronic hip pain  (Tertiary Area of Pain) (Right)   4. Osteoarthritis of hip (Right)   5. Spondylosis without myelopathy or radiculopathy, lumbosacral region   6. Lumbar facet syndrome (Bilateral) (R>L)   7. Lumbar facet arthropathy (Bilateral) (L>R)   8. Chronic anticoagulation (Eliquis)   9. Neurogenic pain   10. Restless leg     Problems updated and reviewed during this visit: Problem  Neurogenic Pain   Plan of Care  Pharmacotherapy (Medications Ordered): Meds ordered this encounter  Medications  . gabapentin (NEURONTIN) 300 MG capsule    Sig: Take 1-3 capsules (300-900 mg total) by mouth 4 (four) times daily.    Dispense:  360 capsule    Refill:  2    Do not place this medication, or any other prescription from our practice, on "Automatic Refill". Patient may have prescription filled one day early if pharmacy is closed on scheduled refill date.   Medications administered today: Amber Gerstenberger "Jackie" had no medications administered during this visit.   Procedure Orders     LUMBAR FACET(MEDIAL BRANCH NERVE BLOCK) MBNB     HIP INJECTION Lab Orders  No laboratory test(s) ordered today   Imaging Orders  No imaging studies ordered today      Referral Orders     Ambulatory referral to Orthopedic Surgery Interventional management options: Planned, scheduled, and/or pending:   NOTE:ELIQUISanticoagulation due to A-fib. (Stop:3 days) (Restart:6 Hrs) Diagnostic right IA hip injection #2+ attempt at draining cyst under fluoroscopic guidance and IV sedation   Considering:   Diagnostic intra-articular right hip injection Diagnostic right sacroiliac joint injection possible right sacroiliac radiofrequency ablation Diagnostic right lumbar epidural steroid injection Diagnostic bilateral lumbar facet nerve block Possible bilateral lumbar facet radiofrequency ablation Diagnostic bilateral intra-articular knee injection Diagnostic bilateral Hyalgan series   Palliative PRN treatment(s):    Palliative right intra-articular hip joint injectionunder fluoroscopic guidance and IV sedation   Provider-requested follow-up: Return for Procedure (w/ sedation): (R) Hip inj. #2, (Blood-thinner Protocol).  Future Appointments  Date Time Provider Department Center  05/29/2018 10:00 AM Mostafa, Osman, LCSW [facility] None  05/31/2018  8:00 AM Naveira, Francisco, MD [facility] None  07/18/2018  2:00 PM Arnett, Margaret G, FNP [facility]   Primary Care Physician: Arnett, Margaret G, FNP Location: [facility] Note by: Francisco A Naveira, MD Date: 05/21/2018; Time: 12:31 PM 

## 2018-05-21 ENCOUNTER — Other Ambulatory Visit: Payer: Self-pay

## 2018-05-21 ENCOUNTER — Ambulatory Visit: Payer: Medicare Other | Attending: Pain Medicine | Admitting: Pain Medicine

## 2018-05-21 ENCOUNTER — Encounter: Payer: Self-pay | Admitting: Pain Medicine

## 2018-05-21 ENCOUNTER — Telehealth: Payer: Self-pay | Admitting: Cardiovascular Disease

## 2018-05-21 VITALS — BP 142/72 | HR 65 | Temp 98.1°F | Resp 18 | Ht 65.0 in | Wt 318.0 lb

## 2018-05-21 DIAGNOSIS — I509 Heart failure, unspecified: Secondary | ICD-10-CM | POA: Insufficient documentation

## 2018-05-21 DIAGNOSIS — F329 Major depressive disorder, single episode, unspecified: Secondary | ICD-10-CM | POA: Diagnosis not present

## 2018-05-21 DIAGNOSIS — M1611 Unilateral primary osteoarthritis, right hip: Secondary | ICD-10-CM

## 2018-05-21 DIAGNOSIS — F419 Anxiety disorder, unspecified: Secondary | ICD-10-CM | POA: Diagnosis not present

## 2018-05-21 DIAGNOSIS — M545 Low back pain, unspecified: Secondary | ICD-10-CM

## 2018-05-21 DIAGNOSIS — K219 Gastro-esophageal reflux disease without esophagitis: Secondary | ICD-10-CM | POA: Insufficient documentation

## 2018-05-21 DIAGNOSIS — E559 Vitamin D deficiency, unspecified: Secondary | ICD-10-CM | POA: Insufficient documentation

## 2018-05-21 DIAGNOSIS — Z87891 Personal history of nicotine dependence: Secondary | ICD-10-CM | POA: Insufficient documentation

## 2018-05-21 DIAGNOSIS — M48061 Spinal stenosis, lumbar region without neurogenic claudication: Secondary | ICD-10-CM | POA: Insufficient documentation

## 2018-05-21 DIAGNOSIS — M8580 Other specified disorders of bone density and structure, unspecified site: Secondary | ICD-10-CM | POA: Insufficient documentation

## 2018-05-21 DIAGNOSIS — R1031 Right lower quadrant pain: Secondary | ICD-10-CM | POA: Diagnosis not present

## 2018-05-21 DIAGNOSIS — M533 Sacrococcygeal disorders, not elsewhere classified: Secondary | ICD-10-CM | POA: Insufficient documentation

## 2018-05-21 DIAGNOSIS — M792 Neuralgia and neuritis, unspecified: Secondary | ICD-10-CM | POA: Diagnosis not present

## 2018-05-21 DIAGNOSIS — Z6841 Body Mass Index (BMI) 40.0 and over, adult: Secondary | ICD-10-CM | POA: Diagnosis not present

## 2018-05-21 DIAGNOSIS — E876 Hypokalemia: Secondary | ICD-10-CM | POA: Diagnosis not present

## 2018-05-21 DIAGNOSIS — M47816 Spondylosis without myelopathy or radiculopathy, lumbar region: Secondary | ICD-10-CM | POA: Diagnosis not present

## 2018-05-21 DIAGNOSIS — J449 Chronic obstructive pulmonary disease, unspecified: Secondary | ICD-10-CM | POA: Diagnosis not present

## 2018-05-21 DIAGNOSIS — M47817 Spondylosis without myelopathy or radiculopathy, lumbosacral region: Secondary | ICD-10-CM

## 2018-05-21 DIAGNOSIS — G2581 Restless legs syndrome: Secondary | ICD-10-CM | POA: Diagnosis not present

## 2018-05-21 DIAGNOSIS — I4891 Unspecified atrial fibrillation: Secondary | ICD-10-CM | POA: Diagnosis not present

## 2018-05-21 DIAGNOSIS — I110 Hypertensive heart disease with heart failure: Secondary | ICD-10-CM | POA: Diagnosis not present

## 2018-05-21 DIAGNOSIS — M160 Bilateral primary osteoarthritis of hip: Secondary | ICD-10-CM | POA: Insufficient documentation

## 2018-05-21 DIAGNOSIS — G894 Chronic pain syndrome: Secondary | ICD-10-CM | POA: Diagnosis present

## 2018-05-21 DIAGNOSIS — Z7901 Long term (current) use of anticoagulants: Secondary | ICD-10-CM

## 2018-05-21 DIAGNOSIS — M25551 Pain in right hip: Secondary | ICD-10-CM

## 2018-05-21 DIAGNOSIS — Z79899 Other long term (current) drug therapy: Secondary | ICD-10-CM | POA: Diagnosis not present

## 2018-05-21 DIAGNOSIS — Z885 Allergy status to narcotic agent status: Secondary | ICD-10-CM | POA: Diagnosis not present

## 2018-05-21 DIAGNOSIS — E782 Mixed hyperlipidemia: Secondary | ICD-10-CM | POA: Diagnosis not present

## 2018-05-21 DIAGNOSIS — G8929 Other chronic pain: Secondary | ICD-10-CM

## 2018-05-21 DIAGNOSIS — Z888 Allergy status to other drugs, medicaments and biological substances status: Secondary | ICD-10-CM | POA: Insufficient documentation

## 2018-05-21 MED ORDER — GABAPENTIN 300 MG PO CAPS: 300.0000 mg | ORAL_CAPSULE | Freq: Four times a day (QID) | ORAL | 2 refills | Status: DC

## 2018-05-21 NOTE — Telephone Encounter (Signed)
   [facility] Pre-operative Risk Assessment    Request for surgical clearance:  1. What type of surgery is being performed? Not listed  2. When is this surgery scheduled? Not listed  3. What type of clearance is required (medical clearance vs. Pharmacy clearance to hold med vs. Both)? pharmacy  4. Are there any medications that need to be held prior to surgery and how long? Eliquis  5. Practice name and name of physician performing surgery? [facility]  6. What is your office phone number [phone] 7.   What is your office fax number [phone]  8.   Anesthesia type (None, local, MAC, general) ? Not listed   Amber Barnes 05/21/2018, 11:41 AM  _________________________________________________________________   (provider comments below)

## 2018-05-21 NOTE — Telephone Encounter (Signed)
Received surgical clearance request below. Information in incomplete, therefore, cannot provide clearance. Tried to reach the surgeons' # below, and it just rings. Will fax back to them for complete information.

## 2018-05-21 NOTE — Progress Notes (Signed)
Safety precautions to be maintained throughout the outpatient stay will include: orient to surroundings, keep bed in low position, maintain call bell within reach at all times, provide assistance with transfer out of bed and ambulation.  

## 2018-05-21 NOTE — Telephone Encounter (Signed)
What is the procedure being done? Cannot provide clearance without this.

## 2018-05-21 NOTE — Telephone Encounter (Signed)
Routing to pharmacy. 

## 2018-05-21 NOTE — Patient Instructions (Addendum)
Gabapentin has been escribed to your pharmacy. ____________________________________________________________________________________________  Preparing for Procedure with Sedation  Instructions: . Oral Intake: Do not eat or drink anything for at least 8 hours prior to your procedure. . Transportation: Public transportation is not allowed. Bring an adult driver. The driver must be physically present in our waiting room before any procedure can be started. Marland Kitchen Physical Assistance: Bring an adult physically capable of assisting you, in the event you need help. This adult should keep you company at home for at least 6 hours after the procedure. . Blood Pressure Medicine: Take your blood pressure medicine with a sip of water the morning of the procedure. . Blood thinners: Notify our staff if you are taking any blood thinners. Depending on which one you take, there will be specific instructions on how and when to stop it. . Diabetics on insulin: Notify the staff so that you can be scheduled 1st case in the morning. If your diabetes requires high dose insulin, take only  of your normal insulin dose the morning of the procedure and notify the staff that you have done so. . Preventing infections: Shower with an antibacterial soap the morning of your procedure. . Build-up your immune system: Take 1000 mg of Vitamin C with every meal (3 times a day) the day prior to your procedure. Marland Kitchen Antibiotics: Inform the staff if you have a condition or reason that requires you to take antibiotics before dental procedures. . Pregnancy: If you are pregnant, call and cancel the procedure. . Sickness: If you have a cold, fever, or any active infections, call and cancel the procedure. . Arrival: You must be in the facility at least 30 minutes prior to your scheduled procedure. . Children: Do not bring children with you. . Dress appropriately: Bring dark clothing that you would not mind if they get stained. . Valuables: Do not  bring any jewelry or valuables.  Procedure appointments are reserved for interventional treatments only. Marland Kitchen No Prescription Refills. . No medication changes will be discussed during procedure appointments. . No disability issues will be discussed.  Reasons to call and reschedule or cancel your procedure: (Following these recommendations will minimize the risk of a serious complication.) . Surgeries: Avoid having procedures within 2 weeks of any surgery. (Avoid for 2 weeks before or after any surgery). . Flu Shots: Avoid having procedures within 2 weeks of a flu shots or . (Avoid for 2 weeks before or after immunizations). . Barium: Avoid having a procedure within 7-10 days after having had a radiological study involving the use of radiological contrast. (Myelograms, Barium swallow or enema study). . Heart attacks: Avoid any elective procedures or surgeries for the initial 6 months after a "Myocardial Infarction" (Heart Attack). . Blood thinners: It is imperative that you stop these medications before procedures. Let us know if you if you take any blood thinner.  . Infection: Avoid procedures during or within two weeks of an infection (including chest colds or gastrointestinal problems). Symptoms associated with infections include: Localized redness, fever, chills, night sweats or profuse sweating, burning sensation when voiding, cough, congestion, stuffiness, runny nose, sore throat, diarrhea, nausea, vomiting, cold or Flu symptoms, recent or current infections. It is specially important if the infection is over the area that we intend to treat. Marland Kitchen Heart and lung problems: Symptoms that may suggest an active cardiopulmonary problem include: cough, chest pain, breathing difficulties or shortness of breath, dizziness, ankle swelling, uncontrolled high or unusually low blood pressure, and/or palpitations. If you  are experiencing any of these symptoms, cancel your procedure and contact your primary care  physician for an evaluation.  Remember:  Regular Business hours are:  Monday to Thursday 8:00 AM to 4:00 PM  Provider's Schedule: Milinda Pointer, MD:  Procedure days: Tuesday and Thursday 7:30 AM to 4:00 PM  Gillis Santa, MD:  Procedure days: Monday and Wednesday 7:30 AM to 4:00 PM ____________________________________________________________________________________________   ____________________________________________________________________________________________  Gabapentin Titration  Medication used: Gabapentin (Generic Name) or Neurontin (Brand Name) 300 mg tablets/capsules  Reasons to stop increasing the dose:  Reason 1: You get good relief of symptoms, in which case there is no need to increase the daily dose any further.    Reason 2: You develop some side effects, such as sleeping all of the time, difficulty concentrating, or becoming disoriented, in which case you need to go down on the dose, to the prior level, where you were not experiencing any side effects. Stay on that dose longer, to allow more time for your body to get use it, before attempting to increase it again.   Reasons to stop increasing the dose: Reason 1: You get good relief of symptoms, in which case there is no need to increase the daily dose any further.  Reason 2: You develop some side effects, such as sleeping all of the time, difficulty concentrating, or becoming disoriented, in which case you need to go down on the dose, to the prior level, where you were not experiencing any side effects. Stay on that dose longer, to allow more time for your body to get use it, before attempting to increase it again.  Steps to increase medication: Step 1: Start by taking 1 (one) tablet at bedtime x 7 (seven) days.  Step 2: After 7 (seven) days of taking 1 (one) tablet at bedtime, increase it to 2 (two) tablets at bedtime. Stay on this dose x 7 (seven) days.  Step 3: After 7 (seven) days of taking 2 (two)  tablets at bedtime, increase it to 3 (three) tablets at bedtime. Stay on this dose x another 7 (seven) days.  Step 4: After 7 (seven) days of taking 3 (three) tablet at bedtime, begin taking 1 (one) tablet at noon with lunch. Stay on this dose x another 7 (seven) days.  Step 5: After 7 (seven) days of taking 3 (three) tablet at bedtime, and 1 (one) tablet at noon, then begin taking 1 (one) tablet in the afternoon with dinner. Stay on this dose x another 7 (seven) days.  Step 6: After 7 (seven) days of taking 3 (three) tablet at bedtime, 1 (one) tablet at noon, and 1 (one) tablet in the afternoon, then begin taking 1 (one) tablet in the morning with breakfast. Stay on this dose x another 7 (seven) days. At this point you should be taking the medicine 4 (four) times a day, or about every 6 (six) hours. This daily regimen of taking the medicine 4 (four) times a day, will be maintained from now on. You should not take any doses any sooner than every 6 (six) hours.  Step 7: After 7 (seven) days of taking 3 (three) tablet at bedtime, 1 (one) tablet at noon, 1 (one) tablet in the afternoon, and 1 (one) tablet in the morning, begin taking 2 (two) tablets at noon with lunch. Stay on this dose x another 7 (seven) days.   Step 8: After 7 (seven) days of taking 3 (three) tablet at bedtime, 2 (two) tablets at noon, 1 (  one) tablet in the afternoon, and 1 (one) tablet in the morning, begin taking 2 (two) tablets in the afternoon with dinner. Stay on this dose x another 7 (seven) days.   Step 9: After 7 (seven) days of taking 3 (three) tablet at bedtime, 2 (two) tablets at noon, 2 (two) tablets in the afternoon, and 1 (one) tablet in the morning, begin taking 2 (two) tablets in the morning with breakfast. Stay on this dose x another 7 (seven) days. At this point you should be taking the medicine 4 (four) times a day, or about every 6 (six) hours. This daily regimen of taking the medicine 4 (four) times a day, will be  maintained from now on. You should not take any doses any sooner than every 6 (six) hours.  Step 10: After 7 (seven) days of taking 3 (three) tablet at bedtime, 2 (two) tablets at noon, 2 (two) tablets in the afternoon, and 2 (two) tablets in the morning, begin taking 3 (three) tablets at noon with lunch. Stay on this dose x another 7 (seven) days.   Step 11: After 7 (seven) days of taking 3 (three) tablet at bedtime, 3 (three) tablets at noon, 2 (two) tablets in the afternoon, and 2 (two) tablets in the morning, begin taking 3 (three) tablets in the afternoon with dinner. Stay on this dose x another 7 (seven) days.   Step 12: After 7 (seven) days of taking 3 (three) tablet at bedtime, 3 (three) tablets at noon, 3 (three) tablets in the afternoon, and 2 (two) tablet in the morning, begin taking 3 (three) tablets in the morning with breakfast. Stay on this dose x another 7 (seven) days. At this point you should be taking the medicine 4 (four) times a day, or about every 6 (six) hours. This daily regimen of taking the medicine 4 (four) times a day, will be maintained from now on.   Endpoint: Once you have reached the maximum dose you can tolerate without side-effects, contact your physician so as to evaluate the results of the regimen.   Questions: Feel free to contact us for any questions or problems at [phone] ____________________________________________________________________________________________  GENERAL RISKS AND COMPLICATIONS  What are the risk, side effects and possible complications? Generally speaking, most procedures are safe.  However, with any procedure there are risks, side effects, and the possibility of complications.  The risks and complications are dependent upon the sites that are lesioned, or the type of nerve block to be performed.  The closer the procedure is to the spine, the more serious the risks are.  Great care is taken when placing the radio frequency needles,  block needles or lesioning probes, but sometimes complications can occur. 1. Infection: Any time there is an injection through the skin, there is a risk of infection.  This is why sterile conditions are used for these blocks.  There are four possible types of infection. 1. Localized skin infection. 2. Central Nervous System Infection-This can be in the form of Meningitis, which can be deadly. 3. Epidural Infections-This can be in the form of an epidural abscess, which can cause pressure inside of the spine, causing compression of the spinal cord with subsequent paralysis. This would require an emergency surgery to decompress, and there are no guarantees that the patient would recover from the paralysis. 4. Discitis-This is an infection of the intervertebral discs.  It occurs in about 1% of discography procedures.  It is difficult to treat and it may lead  to surgery.        2. Pain: the needles have to go through skin and soft tissues, will cause soreness.       3. Damage to internal structures:  The nerves to be lesioned may be near blood vessels or    other nerves which can be potentially damaged.       4. Bleeding: Bleeding is more common if the patient is taking blood thinners such as  aspirin, Coumadin, Ticiid, Plavix, etc., or if he/she have some genetic predisposition  such as hemophilia. Bleeding into the spinal canal can cause compression of the spinal  cord with subsequent paralysis.  This would require an emergency surgery to  decompress and there are no guarantees that the patient would recover from the  paralysis.       5. Pneumothorax:  Puncturing of a lung is a possibility, every time a needle is introduced in  the area of the chest or upper back.  Pneumothorax refers to free air around the  collapsed lung(s), inside of the thoracic cavity (chest cavity).  Another two possible  complications related to a similar event would include: Hemothorax and Chylothorax.   These are variations of the  Pneumothorax, where instead of air around the collapsed  lung(s), you may have blood or chyle, respectively.       6. Spinal headaches: They may occur with any procedures in the area of the spine.       7. Persistent CSF (Cerebro-Spinal Fluid) leakage: This is a rare problem, but may occur  with prolonged intrathecal or epidural catheters either due to the formation of a fistulous  track or a dural tear.       8. Nerve damage: By working so close to the spinal cord, there is always a possibility of  nerve damage, which could be as serious as a permanent spinal cord injury with  paralysis.       9. Death:  Although rare, severe deadly allergic reactions known as "Anaphylactic  reaction" can occur to any of the medications used.      10. Worsening of the symptoms:  We can always make thing worse.  What are the chances of something like this happening? Chances of any of this occuring are extremely low.  By statistics, you have more of a chance of getting killed in a motor vehicle accident: while driving to the hospital than any of the above occurring .  Nevertheless, you should be aware that they are possibilities.  In general, it is similar to taking a shower.  Everybody knows that you can slip, hit your head and get killed.  Does that mean that you should not shower again?  Nevertheless always keep in mind that statistics do not mean anything if you happen to be on the wrong side of them.  Even if a procedure has a 1 (one) in a 1,000,000 (million) chance of going wrong, it you happen to be that one..Also, keep in mind that by statistics, you have more of a chance of having something go wrong when taking medications.  Who should not have this procedure? If you are on a blood thinning medication (e.g. Coumadin, Plavix, see list of "Blood Thinners"), or if you have an active infection going on, you should not have the procedure.  If you are taking any blood thinners, please inform your physician.  How  should I prepare for this procedure?  Do not eat or drink anything at least six  hours prior to the procedure.  Bring a driver with you .  It cannot be a taxi.  Come accompanied by an adult that can drive you back, and that is strong enough to help you if your legs get weak or numb from the local anesthetic.  Take all of your medicines the morning of the procedure with just enough water to swallow them.  If you have diabetes, make sure that you are scheduled to have your procedure done first thing in the morning, whenever possible.  If you have diabetes, take only half of your insulin dose and notify our nurse that you have done so as soon as you arrive at the clinic.  If you are diabetic, but only take blood sugar pills (oral hypoglycemic), then do not take them on the morning of your procedure.  You may take them after you have had the procedure.  Do not take aspirin or any aspirin-containing medications, at least eleven (11) days prior to the procedure.  They may prolong bleeding.  Wear loose fitting clothing that may be easy to take off and that you would not mind if it got stained with Betadine or blood.  Do not wear any jewelry or perfume  Remove any nail coloring.  It will interfere with some of our monitoring equipment.  NOTE: Remember that this is not meant to be interpreted as a complete list of all possible complications.  Unforeseen problems may occur.  BLOOD THINNERS The following drugs contain aspirin or other products, which can cause increased bleeding during surgery and should not be taken for 2 weeks prior to and 1 week after surgery.  If you should need take something for relief of minor pain, you may take acetaminophen which is found in Tylenol,m Datril, Anacin-3 and Panadol. It is not blood thinner. The products listed below are.  Do not take any of the products listed below in addition to any listed on your instruction sheet.  A.P.C or A.P.C with Codeine Codeine  Phosphate Capsules #3 Ibuprofen Ridaura  ABC compound Congesprin Imuran rimadil  Advil Cope Indocin Robaxisal  Alka-Seltzer Effervescent Pain Reliever and Antacid Coricidin or Coricidin-D  Indomethacin Rufen  Alka-Seltzer plus Cold Medicine Cosprin Ketoprofen S-A-C Tablets  Anacin Analgesic Tablets or Capsules Coumadin Korlgesic Salflex  Anacin Extra Strength Analgesic tablets or capsules CP-2 Tablets Lanoril Salicylate  Anaprox Cuprimine Capsules Levenox Salocol  Anexsia-D Dalteparin Magan Salsalate  Anodynos Darvon compound Magnesium Salicylate Sine-off  Ansaid Dasin Capsules Magsal Sodium Salicylate  Anturane Depen Capsules Marnal Soma  APF Arthritis pain formula Dewitt's Pills Measurin Stanback  Argesic Dia-Gesic Meclofenamic Sulfinpyrazone  Arthritis Bayer Timed Release Aspirin Diclofenac Meclomen Sulindac  Arthritis pain formula Anacin Dicumarol Medipren Supac  Analgesic (Safety coated) Arthralgen Diffunasal Mefanamic Suprofen  Arthritis Strength Bufferin Dihydrocodeine Mepro Compound Suprol  Arthropan liquid Dopirydamole Methcarbomol with Aspirin Synalgos  ASA tablets/Enseals Disalcid Micrainin Tagament  Ascriptin Doan's Midol Talwin  Ascriptin A/D Dolene Mobidin Tanderil  Ascriptin Extra Strength Dolobid Moblgesic Ticlid  Ascriptin with Codeine Doloprin or Doloprin with Codeine Momentum Tolectin  Asperbuf Duoprin Mono-gesic Trendar  Aspergum Duradyne Motrin or Motrin IB Triminicin  Aspirin plain, buffered or enteric coated Durasal Myochrisine Trigesic  Aspirin Suppositories Easprin Nalfon Trillsate  Aspirin with Codeine Ecotrin Regular or Extra Strength Naprosyn Uracel  Atromid-S Efficin Naproxen Ursinus  Auranofin Capsules Elmiron Neocylate Vanquish  Axotal Emagrin Norgesic Verin  Azathioprine Empirin or Empirin with Codeine Normiflo Vitamin E  Azolid Emprazil Nuprin Voltaren  Bayer Aspirin plain, buffered  or children's or timed BC Tablets or powders Encaprin Orgaran  Warfarin Sodium  Buff-a-Comp Enoxaparin Orudis Zorpin  Buff-a-Comp with Codeine Equegesic Os-Cal-Gesic   Buffaprin Excedrin plain, buffered or Extra Strength Oxalid   Bufferin Arthritis Strength Feldene Oxphenbutazone   Bufferin plain or Extra Strength Feldene Capsules Oxycodone with Aspirin   Bufferin with Codeine Fenoprofen Fenoprofen Pabalate or Pabalate-SF   Buffets II Flogesic Panagesic   Buffinol plain or Extra Strength Florinal or Florinal with Codeine Panwarfarin   Buf-Tabs Flurbiprofen Penicillamine   Butalbital Compound Four-way cold tablets Penicillin   Butazolidin Fragmin Pepto-Bismol   Carbenicillin Geminisyn Percodan   Carna Arthritis Reliever Geopen Persantine   Carprofen Gold's salt Persistin   Chloramphenicol Goody's Phenylbutazone   Chloromycetin Haltrain Piroxlcam   Clmetidine heparin Plaquenil   Cllnoril Hyco-pap Ponstel   Clofibrate Hydroxy chloroquine Propoxyphen         Before stopping any of these medications, be sure to consult the physician who ordered them.  Some, such as Coumadin (Warfarin) are ordered to prevent or treat serious conditions such as "deep thrombosis", "pumonary embolisms", and other heart problems.  The amount of time that you may need off of the medication may also vary with the medication and the reason for which you were taking it.  If you are taking any of these medications, please make sure you notify your pain physician before you undergo any procedures.   Moderate Conscious Sedation, Adult Sedation is the use of medicines to promote relaxation and relieve discomfort and anxiety. Moderate conscious sedation is a type of sedation. Under moderate conscious sedation, you are less alert than normal, but you are still able to respond to instructions, touch, or both. Moderate conscious sedation is used during short medical and dental procedures. It is milder than deep sedation, which is a type of sedation under which you cannot be easily woken  up. It is also milder than general anesthesia, which is the use of medicines to make you unconscious. Moderate conscious sedation allows you to return to your regular activities sooner. Tell a health care provider about:  Any allergies you have.  All medicines you are taking, including vitamins, herbs, eye drops, creams, and over-the-counter medicines.  Use of steroids (by mouth or creams).  Any problems you or family members have had with sedatives and anesthetic medicines.  Any blood disorders you have.  Any surgeries you have had.  Any medical conditions you have, such as sleep apnea.  Whether you are pregnant or may be pregnant.  Any use of cigarettes, alcohol, marijuana, or street drugs. What are the risks? Generally, this is a safe procedure. However, problems may occur, including:  Getting too much medicine (oversedation).  Nausea.  Allergic reaction to medicines.  Trouble breathing. If this happens, a breathing tube may be used to help with breathing. It will be removed when you are awake and breathing on your own.  Heart trouble.  Lung trouble.  What happens before the procedure? Staying hydrated Follow instructions from your health care provider about hydration, which may include:  Up to 2 hours before the procedure - you may continue to drink clear liquids, such as water, clear fruit juice, black coffee, and plain tea.  Eating and drinking restrictions Follow instructions from your health care provider about eating and drinking, which may include:  8 hours before the procedure - stop eating heavy meals or foods such as meat, fried foods, or fatty foods.  6 hours before the procedure - stop eating  light meals or foods, such as toast or cereal.  6 hours before the procedure - stop drinking milk or drinks that contain milk.  2 hours before the procedure - stop drinking clear liquids.  Medicine  Ask your health care provider about:  Changing or stopping  your regular medicines. This is especially important if you are taking diabetes medicines or blood thinners.  Taking medicines such as aspirin and ibuprofen. These medicines can thin your blood. Do not take these medicines before your procedure if your health care provider instructs you not to.  Tests and exams  You will have a physical exam.  You may have blood tests done to show: ? How well your kidneys and liver are working. ? How well your blood can clot. General instructions  Plan to have someone take you home from the hospital or clinic.  If you will be going home right after the procedure, plan to have someone with you for 24 hours. What happens during the procedure?  An IV tube will be inserted into one of your veins.  Medicine to help you relax (sedative) will be given through the IV tube.  The medical or dental procedure will be performed. What happens after the procedure?  Your blood pressure, heart rate, breathing rate, and blood oxygen level will be monitored often until the medicines you were given have worn off.  Do not drive for 24 hours. This information is not intended to replace advice given to you by your health care provider. Make sure you discuss any questions you have with your health care provider. Document Released: 03/22/2001 Document Revised: 12/01/2015 Document Reviewed: 10/17/2015 Elsevier Interactive Patient Education  2018 [facility] [employer] Patient Information  Description: The facets are joints in the spine between the vertebrae.  Like any joints in the body, facets can become irritated and painful.  Arthritis can also effect the facets.  By injecting steroids and local anesthetic in and around these joints, we can temporarily block the nerve supply to them.  Steroids act directly on irritated nerves and tissues to reduce selling and inflammation which often leads to decreased pain.  Facet blocks may be done anywhere along the spine from the  neck to the low back depending upon the location of your pain.   After numbing the skin with local anesthetic (like Novocaine), a small needle is passed onto the facet joints under x-ray guidance.  You may experience a sensation of pressure while this is being done.  The entire block usually lasts about 15-25 minutes.   Conditions which may be treated by facet blocks:   Low back/buttock pain  Neck/shoulder pain  Certain types of headaches  Preparation for the injection:  1. Do not eat any solid food or dairy products within 8 hours of your appointment. 2. You may drink clear liquid up to 3 hours before appointment.  Clear liquids include water, black coffee, juice or soda.  No milk or cream please. 3. You may take your regular medication, including pain medications, with a sip of water before your appointment.  Diabetics should hold regular insulin (if taken separately) and take 1/2 normal NPH dose the morning of the procedure.  Carry some sugar containing items with you to your appointment. 4. A driver must accompany you and be prepared to drive you home after your procedure. 5. Bring all your current medications with you. 6. An IV may be inserted and sedation may be given at the discretion of the physician.  7. A blood pressure cuff, EKG and other monitors will often be applied during the procedure.  Some patients may need to have extra oxygen administered for a short period. 8. You will be asked to provide medical information, including your allergies and medications, prior to the procedure.  We must know immediately if you are taking blood thinners (like Coumadin/Warfarin) or if you are allergic to IV iodine contrast (dye).  We must know if you could possible be pregnant.  Possible side-effects:   Bleeding from needle site  Infection (rare, may require surgery)  Nerve injury (rare)  Numbness & tingling (temporary)  Difficulty urinating (rare, temporary)  Spinal headache (a  headache worse with upright posture)  Light-headedness (temporary)  Pain at injection site (serveral days)  Decreased blood pressure (rare, temporary)  Weakness in arm/leg (temporary)  Pressure sensation in back/neck (temporary)   Call if you experience:   Fever/chills associated with headache or increased back/neck pain  Headache worsened by an upright position  New onset, weakness or numbness of an extremity below the injection site  Hives or difficulty breathing (go to the emergency room)  Inflammation or drainage at the injection site(s)  Severe back/neck pain greater than usual  New symptoms which are concerning to you  Please note:  Although the local anesthetic injected can often make your back or neck feel good for several hours after the injection, the pain will likely return. It takes 3-7 days for steroids to work.  You may not notice any pain relief for at least one week.  If effective, we will often do a series of 2-3 injections spaced 3-6 weeks apart to maximally decrease your pain.  After the initial series, you may be a candidate for a more permanent nerve block of the facets.  If you have any questions, please call #[phone]) [facility]

## 2018-05-22 ENCOUNTER — Telehealth: Payer: Self-pay | Admitting: Pain Medicine

## 2018-05-22 NOTE — Telephone Encounter (Signed)
Pt takes Eliquis for afib with CHADS2VASc score of 4 (age, sex, HTN, also with atherosclerosis of aorta). Renal function is normal. Ok to hold Eliquis 3 days prior as requested.

## 2018-05-22 NOTE — Telephone Encounter (Signed)
Jennifer at [facility] asking what kind of procedure patient is having, Phys wants to know before they give OK to stop Eliquis.

## 2018-05-22 NOTE — Telephone Encounter (Signed)
Left voicemail for Amber Barnes that patient is scheduled to have a Right intra articular hip injection #2 with possible drainage of cyst under flouro and sedation.

## 2018-05-22 NOTE — Telephone Encounter (Signed)
Pain Management clinic calling States that the procedure patient will be having is a R intra articular hip injection, #2 They will also attempt to drain a cyst by flouroscopic with IV sedation They are requesting Eliquis be stopped 3 days prior and restart within 6 hours after  Please call with any additional questions

## 2018-05-22 NOTE — Telephone Encounter (Signed)
Left message with [facility] to refax clearance or call us with the procedure listed.

## 2018-05-23 ENCOUNTER — Other Ambulatory Visit: Payer: Self-pay | Admitting: Cardiovascular Disease

## 2018-05-24 ENCOUNTER — Other Ambulatory Visit: Payer: Self-pay | Admitting: Psychiatry

## 2018-05-24 ENCOUNTER — Telehealth: Payer: Self-pay

## 2018-05-24 DIAGNOSIS — F331 Major depressive disorder, recurrent, moderate: Secondary | ICD-10-CM

## 2018-05-24 NOTE — Telephone Encounter (Signed)
Spoke with pt, told her that Dr. [name] has given clearance for her to stop Eliquis 3 days prior to upcoming procedure scheduled for 05/31/2018. Told patient to take last dose on 05/27/2018.

## 2018-05-27 ENCOUNTER — Other Ambulatory Visit: Payer: Self-pay | Admitting: Family

## 2018-05-27 DIAGNOSIS — K760 Fatty (change of) liver, not elsewhere classified: Secondary | ICD-10-CM

## 2018-05-29 ENCOUNTER — Ambulatory Visit: Payer: Medicare Other | Admitting: Psychology

## 2018-05-30 ENCOUNTER — Emergency Department
Admission: EM | Admit: 2018-05-30 | Discharge: 2018-05-30 | Disposition: A | Discharge status: Home / Self Care | Payer: Medicare Other | Attending: Emergency Medicine | Admitting: Emergency Medicine

## 2018-05-30 ENCOUNTER — Encounter: Payer: Self-pay | Admitting: Emergency Medicine

## 2018-05-30 ENCOUNTER — Ambulatory Visit: Payer: Self-pay

## 2018-05-30 ENCOUNTER — Emergency Department: Payer: Medicare Other

## 2018-05-30 ENCOUNTER — Other Ambulatory Visit: Payer: Self-pay

## 2018-05-30 DIAGNOSIS — J449 Chronic obstructive pulmonary disease, unspecified: Secondary | ICD-10-CM | POA: Insufficient documentation

## 2018-05-30 DIAGNOSIS — Z91040 Latex allergy status: Secondary | ICD-10-CM | POA: Insufficient documentation

## 2018-05-30 DIAGNOSIS — I509 Heart failure, unspecified: Secondary | ICD-10-CM | POA: Diagnosis not present

## 2018-05-30 DIAGNOSIS — I4891 Unspecified atrial fibrillation: Secondary | ICD-10-CM | POA: Insufficient documentation

## 2018-05-30 DIAGNOSIS — Z7901 Long term (current) use of anticoagulants: Secondary | ICD-10-CM | POA: Insufficient documentation

## 2018-05-30 DIAGNOSIS — G43009 Migraine without aura, not intractable, without status migrainosus: Secondary | ICD-10-CM | POA: Insufficient documentation

## 2018-05-30 DIAGNOSIS — Z87891 Personal history of nicotine dependence: Secondary | ICD-10-CM | POA: Diagnosis not present

## 2018-05-30 DIAGNOSIS — R51 Headache: Secondary | ICD-10-CM | POA: Diagnosis present

## 2018-05-30 DIAGNOSIS — I110 Hypertensive heart disease with heart failure: Secondary | ICD-10-CM | POA: Insufficient documentation

## 2018-05-30 DIAGNOSIS — G43909 Migraine, unspecified, not intractable, without status migrainosus: Secondary | ICD-10-CM

## 2018-05-30 MED ORDER — HYDROMORPHONE HCL 1 MG/ML IJ SOLN
1.0000 mg | Freq: Once | INTRAMUSCULAR | Status: AC
  Administered 2018-05-30: 1 mg via INTRAMUSCULAR
  Filled 2018-05-30: qty 1

## 2018-05-30 MED ORDER — ACETAMINOPHEN 500 MG PO TABS
1000.0000 mg | ORAL_TABLET | Freq: Once | ORAL | Status: AC
  Administered 2018-05-30: 1000 mg via ORAL
  Filled 2018-05-30: qty 2

## 2018-05-30 MED ORDER — PROCHLORPERAZINE MALEATE 10 MG PO TABS
10.0000 mg | ORAL_TABLET | Freq: Once | ORAL | Status: AC
  Administered 2018-05-30: 10 mg via ORAL
  Filled 2018-05-30: qty 1

## 2018-05-30 MED ORDER — DIPHENHYDRAMINE HCL 25 MG PO CAPS
25.0000 mg | ORAL_CAPSULE | Freq: Once | ORAL | Status: AC
  Administered 2018-05-30: 25 mg via ORAL
  Filled 2018-05-30: qty 1

## 2018-05-30 NOTE — Telephone Encounter (Signed)
rec'd call from pt. with c/o severe headache and elevated BP.  Stated has constant headache across her forehead; rated at 8/10.  Took Tylenol ES last night for HA; was not effective.  Reported HA today continued, so checked BP at approx. 1:30 PM; 185/108.  Rechecked BP approx. 3:25 PM; 195/118.  Reported her previous BP's have been 130's/ 60's-70's.  Denied missing any recent doses of blood pressure medication.  Denied vision change, nausea/ vomiting, slurred speech, weakness in extremities.  Stated she is scheduled to receive an injection in AM, for arthritic pain in hip.  Stated has been off Eliquis for 3 days, in prep. for the injection.    Advised to go to ER for extremely elevated BP and severe headache.  Encouraged to have someone else drive her to the appt.  The pt. stated she felt she could drive herself.  Advised it would be safer to have someone else take her; verb. understanding.         Reason for Disposition . [7] Systolic BP  >= 654 OR Diastolic >= 650 AND [3] cardiac or neurologic symptoms (e.g., chest pain, difficulty breathing, unsteady gait, blurred vision)  Answer Assessment - Initial Assessment Questions 1. BLOOD PRESSURE: "What is the blood pressure?" "Did you take at least two measurements 5 minutes apart?"     195/118 @ 3:25 PM, 185/108 @ 1:30 PM ; 151/83 last night at 8:00 PM; 131/68 on 11/18;  134/70 on 11/17  2. ONSET: "When did you take your blood pressure?"     See above 3. HOW: "How did you obtain the blood pressure?" (e.g., visiting nurse, automatic home BP monitor)     Digital  4. HISTORY: "Do you have a history of high blood pressure?"     Yes  5. MEDICATIONS: "Are you taking any medications for blood pressure?" "Have you missed any doses recently?"     Have not missed any doses 6. OTHER SYMPTOMS: "Do you have any symptoms?" (e.g., headache, chest pain, blurred vision, difficulty breathing, weakness)     Headache since last night; 8/10; "I don't feel good." ; denied  slurred speech, vision changes, or weakness of extremities; denied chest pain or shortness of breath 7. PREGNANCY: "Is there any chance you are pregnant?" "When was your last menstrual period?"    N/a  Protocols used: HIGH BLOOD PRESSURE-A-AH

## 2018-05-30 NOTE — Telephone Encounter (Signed)
Called pt. Back to check if able to find someone to drive her to the ER.  Stated her daughter-in-law will take her; she is on her way.

## 2018-05-30 NOTE — Progress Notes (Addendum)
Patient's Name: Amber Barnes  MRN: [id number]  Referring Provider: [name], FNP  DOB: [birth year]  PCP: [name], FNP  DOS: 05/31/2018  Note by: Gaspar Cola, MD  Service setting: Ambulatory outpatient  Specialty: Interventional Pain Management  Patient type: Established  Location: [facility] (AMB) Pain Management Facility  Visit type: Interventional Procedure   Primary Reason for Visit: Interventional Pain Management Treatment. CC: Hip Pain (right)  Procedure:          Anesthesia, Analgesia, Anxiolysis:  Type: Intra-Articular Hip Injection #2  Primary Purpose: Diagnostic Region: Posterolateral hip joint area. Level: Lower pelvic and hip joint level. Target Area: Superior aspect of the hip joint cavity, going thru the superior portion of the capsular ligament. Approach: Posterolateral approach. Laterality: Right-Sided  Type: Moderate (Conscious) Sedation combined with Local Anesthesia Indication(s): Analgesia and Anxiety Route: Intravenous (IV) IV Access: Secured Sedation: Meaningful verbal contact was maintained at all times during the procedure  Local Anesthetic: Lidocaine 1-2%  Position: Lateral Decubitus with bad side up Prepped Area: Entire Posterolateral hip area. Prepping solution: ChloraPrep (2% chlorhexidine gluconate and 70% isopropyl alcohol)   Indications: 1. Chronic hip pain [facility] Area of Pain) (Right)   2. Osteoarthritis of hip (Right)   3. Chronic anticoagulation (Eliquis)   4. History of allergy to latex   5. Morbid obesity with BMI of 50.0-59.9, adult ([place])   6. Chronic hip pain, right   7. Primary osteoarthritis of right hip    Pain Score: Pre-procedure: 9 /10 Post-procedure: 0-No pain/10  Pre-op Assessment:  Amber Barnes is a 67 y.o. (year old), female patient, seen today for interventional treatment. She  has a past surgical history that includes Thyroidectomy; Colonoscopy; Esophagogastroduodenoscopy; Abdominal hysterectomy;  Achilles tendon surgery; Back surgery; CARDIOVERSION (N/A, 09/07/2016); CARDIOVERSION (N/A, 10/19/2016); and CARDIOVERSION (N/A, 12/14/2016). Amber Barnes has a current medication list which includes the following prescription(s): amiodarone, b complex vitamins, bisoprolol, bupropion, calcium gluconate, vitamin d3, eliquis, esomeprazole, furosemide, gabapentin, hydralazine, hydroxyzine, klor-con 10, losartan, meclizine, multivitamin, oxymetazoline, ranolazine, rizatriptan, and rosuvastatin, and the following Facility-Administered Medications: fentanyl and midazolam. Her primarily concern today is the Hip Pain (right)  Initial Vital Signs:  Pulse/HCG Rate: 68ECG Heart Rate: 62 Temp: 97.7 F (36.5 C) Resp: 20 BP: (!) 143/80 SpO2: 94 %  BMI: Estimated body mass index is 52.42 kg/m as calculated from the following:   Height as of this encounter: 5\' 5"  (1.651 m).   Weight as of this encounter: 315 lb (142.9 kg).  Risk Assessment: Allergies: Reviewed. She is allergic to prednisone; hydrocodone-acetaminophen; oxycodone; tramadol; adhesive [tape]; and latex.  Allergy Precautions: Latex-free protocol activated Coagulopathies: Reviewed. None identified.  Blood-thinner therapy: None at this time Active Infection(s): Reviewed. None identified. Amber Barnes is afebrile  Site Confirmation: Amber Barnes was asked to confirm the procedure and laterality before marking the site Procedure checklist: Completed Consent: Before the procedure and under the influence of no sedative(s), amnesic(s), or anxiolytics, the patient was informed of the treatment options, risks and possible complications. To fulfill our ethical and legal obligations, as recommended by the American Medical Association's Code of Ethics, I have informed the patient of my clinical impression; the nature and purpose of the treatment or procedure; the risks, benefits, and possible complications of the intervention; the alternatives, including doing nothing;  the risk(s) and benefit(s) of the alternative treatment(s) or procedure(s); and the risk(s) and benefit(s) of doing nothing. The patient was provided information about the general risks and possible complications associated with the procedure. These  may include, but are not limited to: failure to achieve desired goals, infection, bleeding, organ or nerve damage, allergic reactions, paralysis, and death. In addition, the patient was informed of those risks and complications associated to the procedure, such as failure to decrease pain; infection; bleeding; organ or nerve damage with subsequent damage to sensory, motor, and/or autonomic systems, resulting in permanent pain, numbness, and/or weakness of one or several areas of the body; allergic reactions; (i.e.: anaphylactic reaction); and/or death. Furthermore, the patient was informed of those risks and complications associated with the medications. These include, but are not limited to: allergic reactions (i.e.: anaphylactic or anaphylactoid reaction(s)); adrenal axis suppression; blood sugar elevation that in diabetics may result in ketoacidosis or comma; water retention that in patients with history of congestive heart failure may result in shortness of breath, pulmonary edema, and decompensation with resultant heart failure; weight gain; swelling or edema; medication-induced neural toxicity; particulate matter embolism and blood vessel occlusion with resultant organ, and/or nervous system infarction; and/or aseptic necrosis of one or more joints. Finally, the patient was informed that Medicine is not an exact science; therefore, there is also the possibility of unforeseen or unpredictable risks and/or possible complications that may result in a catastrophic outcome. The patient indicated having understood very clearly. We have given the patient no guarantees and we have made no promises. Enough time was given to the patient to ask questions, all of which  were answered to the patient's satisfaction. Amber Barnes has indicated that she wanted to continue with the procedure. Attestation: I, the ordering provider, attest that I have discussed with the patient the benefits, risks, side-effects, alternatives, likelihood of achieving goals, and potential problems during recovery for the procedure that I have provided informed consent. Date  Time: 05/31/2018  7:52 AM  Pre-Procedure Preparation:  Monitoring: As per clinic protocol. Respiration, ETCO2, SpO2, BP, heart rate and rhythm monitor placed and checked for adequate function Safety Precautions: Patient was assessed for positional comfort and pressure points before starting the procedure. Time-out: I initiated and conducted the "Time-out" before starting the procedure, as per protocol. The patient was asked to participate by confirming the accuracy of the "Time Out" information. Verification of the correct person, site, and procedure were performed and confirmed by me, the nursing staff, and the patient. "Time-out" conducted as per Joint Commission's Universal Protocol (UP.01.01.01). Time: 0830  Description of Procedure:          Safety Precautions: Aspiration looking for blood return was conducted prior to all injections. At no point did we inject any substances, as a needle was being advanced. No attempts were made at seeking any paresthesias. Safe injection practices and needle disposal techniques used. Medications properly checked for expiration dates. SDV (single dose vial) medications used. Description of the Procedure: Protocol guidelines were followed. The patient was placed in position over the fluoroscopy table. The target area was identified and the area prepped in the usual manner. Skin & deeper tissues infiltrated with local anesthetic. Appropriate amount of time allowed to pass for local anesthetics to take effect. The procedure needles were then advanced to the target area. Proper needle  placement secured. Negative aspiration confirmed. Solution injected in intermittent fashion, asking for systemic symptoms every 0.5cc of injectate. The needles were then removed and the area cleansed, making sure to leave some of the prepping solution back to take advantage of its long term bactericidal properties. Vitals:   05/31/18 0844 05/31/18 0849 05/31/18 0859 05/31/18 0909  BP: (!) 143/75 Marland Kitchen)  151/76 (!) 141/76 (!) 153/74  Pulse:      Resp: 16 12 11 13   Temp: 98.8 F (37.1 C)   98.1 F (36.7 C)  TempSrc: Temporal   Temporal  SpO2: 93% 93% 94% 96%  Weight:      Height:        Start Time: 0830 hrs. End Time: 0839 hrs. Materials:  Needle(s) Type: Spinal Needle Gauge: 22G Length: 7-in Medication(s): Please see orders for medications and dosing details.  Imaging Guidance (Non-Spinal):          Type of Imaging Technique: Fluoroscopy Guidance (Non-Spinal) Indication(s): Assistance in needle guidance and placement for procedures requiring needle placement in or near specific anatomical locations not easily accessible without such assistance. Exposure Time: Please see nurses notes. Contrast: Before injecting any contrast, we confirmed that the patient did not have an allergy to iodine, shellfish, or radiological contrast. Once satisfactory needle placement was completed at the desired level, radiological contrast was injected. Contrast injected under live fluoroscopy. No contrast complications. See chart for type and volume of contrast used. Fluoroscopic Guidance: I was personally present during the use of fluoroscopy. "Tunnel Vision Technique" used to obtain the best possible view of the target area. Parallax error corrected before commencing the procedure. "Direction-depth-direction" technique used to introduce the needle under continuous pulsed fluoroscopy. Once target was reached, antero-posterior, oblique, and lateral fluoroscopic projection used confirm needle placement in all planes.  Images permanently stored in EMR.    Interpretation: I personally interpreted the imaging intraoperatively. Adequate needle placement confirmed in multiple planes. Appropriate spread of contrast into desired area was observed. No evidence of afferent or efferent intravascular uptake. Permanent images saved into the patient's record.  Antibiotic Prophylaxis:   Anti-infectives (From admission, onward)   None     Indication(s): None identified  Post-operative Assessment:  Post-procedure Vital Signs:  Pulse/HCG Rate: (!) 57(!) 59 Temp: 98.1 F (36.7 C) Resp: 13 BP: (!) 153/74 SpO2: 96 %  EBL: None  Complications: No immediate post-treatment complications observed by team, or reported by patient.  Note: The patient tolerated the entire procedure well. A repeat set of vitals were taken after the procedure and the patient was kept under observation following institutional policy, for this type of procedure. Post-procedural neurological assessment was performed, showing return to baseline, prior to discharge. The patient was provided with post-procedure discharge instructions, including a section on how to identify potential problems. Should any problems arise concerning this procedure, the patient was given instructions to immediately contact us, at any time, without hesitation. In any case, we plan to contact the patient by telephone for a follow-up status report regarding this interventional procedure.  Comments:  No additional relevant information.  Plan of Care  Interventional management options: Planned, scheduled, and/or pending:   NOTE:ELIQUISanticoagulation due to A-fib. (Stop:3 days) (Restart:6 Hrs). NO RFA until weight is below BMI of 35 Diagnosticright IA hip injection#2+ attempt at draining cyst under fluoroscopic guidance and IV sedation (Today)   Considering: Diagnostic intra-articular right hip injection Diagnostic right sacroiliac joint injection Diagnostic  right lumbar epidural steroid injection Diagnostic bilateral lumbar facet nerve block Diagnostic bilateral intra-articular knee injection Diagnostic bilateral Hyalgan series   Palliative PRN treatment(s): Palliativeright intra-articular hip joint injection(No sooner than every 6 months) (Last done 05/31/18)   Imaging Orders     DG C-Arm 1-60 Min-No Report  Procedure Orders     HIP INJECTION  Medications ordered for procedure: Meds ordered this encounter  Medications  . iopamidol (ISOVUE-M)  41 % intrathecal injection 10 mL    Must be Myelogram-compatible. If not available, you may substitute with a water-soluble, non-ionic, hypoallergenic, myelogram-compatible radiological contrast medium.  Marland Kitchen lidocaine (XYLOCAINE) 2 % (with pres) injection 400 mg  . midazolam (VERSED) 5 MG/5ML injection 1-2 mg    Make sure Flumazenil is available in the pyxis when using this medication. If oversedation occurs, administer 0.2 mg IV over 15 sec. If after 45 sec no response, administer 0.2 mg again over 1 min; may repeat at 1 min intervals; not to exceed 4 doses (1 mg)  . fentaNYL (SUBLIMAZE) injection 25-50 mcg    Make sure Narcan is available in the pyxis when using this medication. In the event of respiratory depression (RR< 8/min): Titrate NARCAN (naloxone) in increments of 0.1 to 0.2 mg IV at 2-3 minute intervals, until desired degree of reversal.  . lactated ringers infusion 1,000 mL  . methylPREDNISolone acetate (DEPO-MEDROL) injection 80 mg  . ropivacaine (PF) 2 mg/mL (0.2%) (NAROPIN) injection 4 mL   Medications administered: We administered iopamidol, lidocaine, midazolam, fentaNYL, lactated ringers, methylPREDNISolone acetate, and ropivacaine (PF) 2 mg/mL (0.2%).  See the medical record for exact dosing, route, and time of administration.  Disposition: Discharge home  Discharge Date & Time: 05/31/2018; 0912 hrs.   Physician-requested Follow-up: Return for post-procedure eval (2 wks), w/  Dionisio David, NP.  Future Appointments  Date Time Provider [place]  06/11/2018  2:00 PM Buena Irish, [place] [facility] None  06/14/2018  9:00 AM Vevelyn Francois, NP [facility] None  07/18/2018  2:00 PM Arnett, Yvetta Coder, FNP [facility]   Primary Care Physician: [name], FNP Location: [facility] Note by: Gaspar Cola, MD Date: 05/31/2018; Time: 9:33 AM  Disclaimer:  Medicine is not an exact science. The only guarantee in medicine is that nothing is guaranteed. It is important to note that the decision to proceed with this intervention was based on the information collected from the patient. The Data and conclusions were drawn from the patient's questionnaire, the interview, and the physical examination. Because the information was provided in large part by the patient, it cannot be guaranteed that it has not been purposely or unconsciously manipulated. Every effort has been made to obtain as much relevant data as possible for this evaluation. It is important to note that the conclusions that lead to this procedure are derived in large part from the available data. Always take into account that the treatment will also be dependent on availability of resources and existing treatment guidelines, considered by other Pain Management Practitioners as being common knowledge and practice, at the time of the intervention. For Medico-Legal purposes, it is also important to point out that variation in procedural techniques and pharmacological choices are the acceptable norm. The indications, contraindications, technique, and results of the above procedure should only be interpreted and judged by a Board-Certified Interventional Pain Specialist with extensive familiarity and expertise in the same exact procedure and technique.

## 2018-05-30 NOTE — ED Triage Notes (Signed)
Patient reports headache x2 days. Reports having increased blood pressure at home for 2 days. States pain is making her nauseous. Patient denies CP/SOB. Reports history of migraines. BP in triage 154/75

## 2018-05-30 NOTE — Patient Instructions (Addendum)
____________________________________________________________________________________________  Post-Procedure Discharge Instructions  Instructions:  Apply ice: Fill a plastic sandwich bag with crushed ice. Cover it with a small towel and apply to injection site. Apply for 15 minutes then remove x 15 minutes. Repeat sequence on day of procedure, until you go to bed. The purpose is to minimize swelling and discomfort after procedure.  Apply heat: Apply heat to procedure site starting the day following the procedure. The purpose is to treat any soreness and discomfort from the procedure.  Food intake: Start with clear liquids (like water) and advance to regular food, as tolerated.   Physical activities: Keep activities to a minimum for the first 8 hours after the procedure.   Driving: If you have received any sedation, you are not allowed to drive for 24 hours after your procedure.  Blood thinner: Restart your blood thinner 6 hours after your procedure. (Only for those taking blood thinners)  Insulin: As soon as you can eat, you may resume your normal dosing schedule. (Only for those taking insulin)  Infection prevention: Keep procedure site clean and dry.  Post-procedure Pain Diary: Extremely important that this be done correctly and accurately. Recorded information will be used to determine the next step in treatment.  Pain evaluated is that of treated area only. Do not include pain from an untreated area.  Complete every hour, on the hour, for the initial 8 hours. Set an alarm to help you do this part accurately.  Do not go to sleep and have it completed later. It will not be accurate.  Follow-up appointment: Keep your follow-up appointment after the procedure. Usually 2 weeks for most procedures. (6 weeks in the case of radiofrequency.) Bring you pain diary.   Expect:  From numbing medicine (AKA: Local Anesthetics): Numbness or decrease in pain.  Onset: Full effect within 15  minutes of injected.  Duration: It will depend on the type of local anesthetic used. On the average, 1 to 8 hours.   From steroids: Decrease in swelling or inflammation. Once inflammation is improved, relief of the pain will follow.  Onset of benefits: Depends on the amount of swelling present. The more swelling, the longer it will take for the benefits to be seen. In some cases, up to 10 days.  Duration: Steroids will stay in the system x 2 weeks. Duration of benefits will depend on multiple posibilities including persistent irritating factors.  Occasional side-effects: Facial flushing (red, warm cheeks) , cramps (if present, drink Gatorade and take over-the-counter Magnesium 450-500 mg once to twice a day).  From procedure: Some discomfort is to be expected once the numbing medicine wears off. This should be minimal if ice and heat are applied as instructed.  Call if:  You experience numbness and weakness that gets worse with time, as opposed to wearing off.  New onset bowel or bladder incontinence. (This applies to Spinal procedures only)  Emergency Numbers:  Durning business hours (Monday - Thursday, 8:00 AM - 4:00 PM) (Friday, 9:00 AM - 12:00 Noon): [phone]  After hours: [phone] ____________________________________________________________________________________________   ____________________________________________________________________________________________  Weight Management Required  URGENT: Your weight has been found to be adversely affecting your health.  Dear Amber Barnes:  Your current Body mass index is 52.42 kg/m.Marland Kitchen Estimated body mass index is 52.42 kg/m as calculated from the following:   Height as of this encounter: 5\' 5"  (1.651 m).   Weight as of this encounter: 315 lb (142.9 kg).  Your last four (4) weight and BMI calculations  are as follows: Wt Readings from Last 4 Encounters:  05/31/18 (!) 315 lb (142.9 kg)  05/30/18 (!) 315 lb (142.9  kg)  05/21/18 (!) 318 lb (144.2 kg)  05/03/18 (!) 318 lb (144.2 kg)   BMI Readings from Last 4 Encounters:  05/31/18 52.42 kg/m  05/30/18 52.42 kg/m  05/21/18 52.92 kg/m  05/03/18 52.92 kg/m    Calculations estimate your ideal body weight to be: Ideal body weight: 57 kg (125 lb 10.6 oz) Adjusted ideal body weight: 91.4 kg (201 lb 6.4 oz)  Please use the table below to identify your weight category and associated incidence of chronic pain, secondary to your weight.  BMI interpretation table: BMI level Category Associated incidence of chronic pain  <18 kg/m2 Underweight   18.5-24.9 kg/m2 Ideal body weight   25-29.9 kg/m2 Overweight  20%  30-34.9 kg/m2 Obese (Class I)  68%  35-39.9 kg/m2 Severe obesity (Class II)  136%  >40 kg/m2 Extreme obesity (Class III)  254%   In addition: You will be considered "Morbidly Obese", if your BMI is above 30 and you have one or more of the following conditions that are directly associated with obesity: 1. Type 2 Diabetes (Which in turn can lead to cardiovascular diseases (CVD), stroke, peripheral vascular diseases (PVD), retinopathy, nephropathy, and neuropathy) 2. Cardiovascular Disease  3. Breathing problems (Asthma, obesity-hypoventilation syndrome, obstructive sleep apnea, chronic inflammatory airway disease) 4. Chronic kidney disease 5. Liver disease (nonalcoholic fatty liver disease) 6. High blood pressure 7. Acid reflux (gastroesophageal reflux disease) 8. Osteoarthritis (OA) 9. Low back pain (Lumbar Facet Syndrome) 10. Hip pain (Osteoarthritis of hip) 11. Knee pain (Osteoarthritis of knee) (patients with a BMI>30 kg/m2 were 6.8 times more likely to develop knee OA than normal-weight individuals) 12. Certain types of cancer. (Epidemiological studies have shown that obesity is a risk factor for: post-menopausal breast cancer; cancers of the endometrium, colon and kidney; malignant adenomas of the oesophagus. Obese subjects have an  approximately 1.5-3.5-fold increased risk of developing these cancers compared with normal-weight subjects, and it has been estimated that between 15 and 45% of these cancers can be attributed to overweight. More recent studies suggest that obesity may also increase the risk of other types of cancer, including pancreatic, hepatic and gallbladder cancer. Ref: Obesity and cancer. Pischon T, Nthlings U, [employer] H. Proc Nutr Soc. 2008 May;67(2):128-45. doi: 94.8546/E7035009381829937.)  Recommendation: At this point it is urgent that you take a step back and concentrate in loosing weight. Because most chronic pain patients do have difficulty exercising secondary to their pain, you must rely on proper nutrition and dieting in order to lose the weight. If your BMI is above 40, you should seriously consider bariatric surgery. A realistic goal is to lose 10% of your body weight over a period of 12 months.  If over time you have unsuccessfully try to lose weight, then it is time for you to seek professional help and to enter a medically supervised weight management program.  Pain management considerations:  1. Pharmacological Problems: Be advised that the use of opioid analgesics has been associated with decreased metabolism and weight gain.  For this reason, should we see that you are unable to lose weight while taking these medications, it may become necessary for [place] to taper down and indefinitely discontinue these medicines.  2. Technical Problems: The incidence of successful interventional therapies decreases as the patient's BMI increases. It is much more difficult to accomplish a safe and effective interventional therapy on a patient with  a BMI above 35. Yours is Body mass index is 52.42 kg/m.Marland Kitchen 3. Radiation Exposure Problems: The x-rays machine, used to accomplish injection therapies, will automatically increase their x-ray output in order to capture an appropriate bone image. This means that radiation exposure  increases exponentially with the patient's BMI. (The higher the BMI, the higher the radiation exposure.) Although the level of radiation used at a given time is still safe to the patient, it is not for the physician and/or assisting staff. Unfortunately, radiation exposure is accumulative. Because physicians and the staff have to do procedures and be exposed on a daily basis, this can result in health problems such as cancer and radiation burns. Radiation exposure to the staff is monitored by the radiation batches that they wear. The exposure levels are reported back to the staff on a quarterly basis. Depending on levels of exposure, physicians and staff may be obligated by law to decrease this exposure. This means that they have the right and obligation to refuse providing therapies where they may be overexposed to radiation. For this reason, physicians may decline to offer therapies such as radiofrequency ablation or implants to patients with a BMI above 40. ____________________________________________________________________________________________

## 2018-05-30 NOTE — ED Notes (Signed)
First RN: BP at home 189/ something. Pt states she called MD and was told to come to ED. Hx HTN per pt. A&O, ambulatory. No distress noted.

## 2018-05-30 NOTE — Discharge Instructions (Addendum)
Please drink plenty of fluid to stay well-hydrated.  You may continue to take your migraine medication as prescribed.  Return to the emergency department if you develop severe pain, lightheadedness or fainting, numbness weakness or tingling, or any other symptoms concerning to you.

## 2018-05-30 NOTE — ED Provider Notes (Signed)
[facility] Emergency Department Provider Note  ____________________________________________  Time seen: Approximately 7:57 PM  I have reviewed the triage vital signs and the nursing notes.   HISTORY  Chief Complaint Headache and Hypertension    HPI Amber Barnes is a 67 y.o. female a history of hypertension and chronic migraines, A. fib on Eliquis, presenting with migraine.  The patient reports that since yesterday morning, she has had a progressive frontal headache with photosensitivity that is typical of her usual headaches.  She has noted that with this headache, her blood pressure has gone up.  It is not the worst headache of her life, and it does feel similar to previous migraines that have required ED treatment.  She tried her migraine medicine which did not help.  She did not take any additional medications for her symptoms.  She has not had any recent trauma, visual, speech or mental status changes, numbness tingling or weakness, difficulty walking, fevers or chills, tick bites or rash.  Past Medical History:  Diagnosis Date  . Achilles tendinitis   . Acute medial meniscal tear   . Allergy   . Anxiety   . Arthritis   . Atrial fibrillation ([place])    a. new onset 07/2016; b. CHADS2VASc --> 2 (HTN, female); c. eliquis  . CHF (congestive heart failure) ([place])   . Chronic lower back pain   . COPD (chronic obstructive pulmonary disease) ([place])   . Depression   . Fibrocystic breast disease   . GERD (gastroesophageal reflux disease)   . Hyperlipidemia   . Hypertension   . Insomnia   . Lipoma   . Migraine   . Osteoarthritis    left knee  . Osteopenia   . Tendonitis   . Thyroid disease   . Vitamin D deficiency     Patient Active Problem List   Diagnosis Date Noted  . Neurogenic pain 05/21/2018  . Chronic low back pain (Fourth Area of Pain) (Bilateral) (R>L) 05/03/2018  . Abnormal MRI, lumbar spine 05/03/2018  . Lumbar facet arthropathy  (Bilateral) (L>R) 05/03/2018  . Lumbar lateral recess stenosis (L3-4, L4-5) (Left) 05/03/2018  . Lumbar foraminal stenosis (L4-5) (Left) 05/03/2018  . Lumbar central spinal stenosis (L4-5) 05/03/2018  . Latex precautions, history of latex allergy 05/03/2018  . History of lumbar laminectomy 04/25/2018  . Failed back surgical syndrome 04/25/2018  . Osteoarthritis of hip (Right) 04/05/2018  . History of allergy to latex 04/05/2018  . Spondylosis without myelopathy or radiculopathy, lumbosacral region 03/27/2018  . Other specified dorsopathies, sacral and sacrococcygeal region 03/27/2018  . Lumbar facet syndrome (Bilateral) (R>L) 03/21/2018  . Chronic sacroiliac joint pain (Left) 03/21/2018  . Chronic anticoagulation (Eliquis) 03/21/2018  . Chronic groin pain (Primary Area of Pain) (Right) 02/27/2018  . Chronic lower extremity pain (Secondary Area of Pain) (Right) 02/27/2018  . Chronic hip pain [facility] Area of Pain) (Right) 02/27/2018  . Chronic low back pain (Fourth Area of Pain) (Bilateral) w/ sciatica (Right) 02/27/2018  . Chronic knee pain (Bilateral) 02/27/2018  . Chronic pain syndrome 02/27/2018  . Pharmacologic therapy 02/27/2018  . Disorder of skeletal system 02/27/2018  . Problems influencing health status 02/27/2018  . Screening mammogram, encounter for 02/02/2018  . Numbness and tingling 12/08/2017  . Olecranon bursitis of left elbow 11/14/2017  . Cervical radiculopathy 11/14/2017  . Atherosclerosis of aorta ([place]) 08/25/2017  . Migraine with aura and without status migrainosus, not intractable 06/27/2017  . Bradycardia 05/22/2017  . Depression, recurrent ([place]) 05/22/2017  .  Restless leg 04/13/2017  . OSA (obstructive sleep apnea) 01/09/2017  . Acute pain of left shoulder 11/28/2016  . Persistent atrial fibrillation 08/11/2016  . Acute pulmonary edema ([facility])   . Hypokalemia 07/28/2016  . Left foot pain 07/28/2016  . Atypical chest pain   . Fatty liver 06/30/2016  .  Dizziness 06/08/2016  . Family history of ovarian cancer 06/08/2016  . GERD (gastroesophageal reflux disease) 05/05/2016  . Seasonal rhinitis 05/05/2016  . Routine physical examination 05/05/2016  . Heel spur 11/05/2015  . Tendonitis 11/05/2015  . SOB (shortness of breath) 11/11/2014  . Morbid obesity ([place]) 11/11/2014  . Mixed hyperlipidemia 11/11/2014  . Essential hypertension 11/11/2014  . Anxiety and depression 11/11/2014  . Arthritis, senescent 11/11/2014  . Obesity, Class III, BMI 40-49.9 (morbid obesity) ([place]) 04/11/2013    Past Surgical History:  Procedure Laterality Date  . ABDOMINAL HYSTERECTOMY     ovaries left  . ACHILLES TENDON SURGERY     2012,01/2017  . BACK SURGERY    . CARDIOVERSION N/A 09/07/2016   Procedure: CARDIOVERSION;  Surgeon: Minna Merritts, MD;  Location: [facility] ORS;  Service: Cardiovascular;  Laterality: N/A;  . CARDIOVERSION N/A 10/19/2016   Procedure: CARDIOVERSION;  Surgeon: Minna Merritts, MD;  Location: [facility] ORS;  Service: Cardiovascular;  Laterality: N/A;  . CARDIOVERSION N/A 12/14/2016   Procedure: CARDIOVERSION;  Surgeon: Minna Merritts, MD;  Location: [facility] ORS;  Service: Cardiovascular;  Laterality: N/A;  . COLONOSCOPY    . ESOPHAGOGASTRODUODENOSCOPY    . THYROIDECTOMY     thyroid lobectomy secondary to a benign nodule    Current Outpatient Rx  . Order #: [id number] Class: Normal  . Order #: [id number] Class: Historical Med  . Order #: [id number] Class: Normal  . Order #: [id number] Class: Normal  . Order #: [id number] Class: Historical Med  . Order #: [id number] Class: Historical Med  . Order #: [id number] Class: Normal  . Order #: [id number] Class: Historical Med  . Order #: [id number] Class: Normal  . Order #: [id number] Class: Normal  . Order #: [id number] Class: Normal  . Order #: [id number] Class: Normal  . Order #: [id number] Class: Normal  . Order #: [id number] Class: Normal  . Order #: [id number] Class: Print  . Order #: [id number] Class: Historical Med   . Order #: [id number] Class: Historical Med  . Order #: [id number] Class: Normal  . Order #: [id number] Class: Normal  . Order #: [id number] Class: Normal    Allergies Prednisone; Hydrocodone-acetaminophen; Oxycodone; Tramadol; Adhesive [tape]; and Latex  Family History  Problem Relation Age of Onset  . Cancer Sister 62       ovarian  . Depression Sister   . Cancer Father 12       colon  . Heart disease Father   . Heart attack Father 75       died from MI  . Hyperlipidemia Father   . Hypertension Father   . Cancer Mother        leukemia  . Breast cancer Paternal Aunt        22's  . Migraines Neg Hx     Social History Social History   Tobacco Use  . Smoking status: Former Smoker    Packs/day: 0.25    Years: 10.00    Pack years: 2.50    Types: Cigarettes    Last attempt to quit: 2005    Years since quitting: 14.8  . Smokeless tobacco: Former Systems developer    Quit date: 07/11/2001  . Tobacco comment: smoked for 10 years, 1 pack every 2-3 days  Substance Use Topics  .  Alcohol use: Not Currently    Comment: social, occasional  . Drug use: No    Review of Systems Constitutional: No fever/chills.  No lightheadedness or syncope. Eyes: No visual changes.  No blurred or double vision. ENT: No sore throat. No congestion or rhinorrhea. Cardiovascular: Denies chest pain. Denies palpitations. Respiratory: Denies shortness of breath.  No cough. Gastrointestinal: No abdominal pain.  No nausea, no vomiting.  No diarrhea.  No constipation. Genitourinary: Negative for dysuria. Musculoskeletal: Negative for back pain. Skin: Negative for rash. Neurological: Positive for frontal headache. No focal numbness, tingling or weakness.  No changes in vision or speech, or mental status.  No difficulty walking.    ____________________________________________   PHYSICAL EXAM:  VITAL SIGNS: ED Triage Vitals  Enc Vitals Group     BP 05/30/18 1643 (!) 154/75     Pulse Rate 05/30/18 1643 76      Resp --      Temp 05/30/18 1643 98 F (36.7 C)     Temp Source 05/30/18 1643 Oral     SpO2 05/30/18 1643 96 %     Weight 05/30/18 1644 (!) 315 lb (142.9 kg)     Height 05/30/18 1644 5\' 5"  (1.651 m)     Head Circumference --      Peak Flow --      Pain Score 05/30/18 1656 10     Pain Loc --      Pain Edu? --      Excl. in [place]? --     Constitutional: Alert and oriented. Answers questions appropriately. Eyes: Conjunctivae are normal.  EOMI. PERRLA.  No vertical or horizontal nystagmus.  No scleral icterus. Head: Atraumatic. Nose: No congestion/rhinnorhea. Mouth/Throat: Mucous membranes are moist.  Neck: No stridor.  Supple.  No JVD.  No meningismus. Cardiovascular: Normal rate, regular rhythm. No murmurs, rubs or gallops.  Respiratory: Normal respiratory effort.  No accessory muscle use or retractions. Lungs CTAB.  No wheezes, rales or ronchi. Gastrointestinal: Morbidly obese.  Soft, nontender and nondistended.  No guarding or rebound.  No peritoneal signs. Musculoskeletal: No LE edema. No ttp in the calves or palpable cords.  Negative Homan's sign. Neurologic:  A&Ox3.  Speech is clear.  Face and smile are symmetric.  EOMI. the left.  No vertical or horizontal nystagmus.  Moves all extremities well. Skin:  Skin is warm, dry and intact. No rash noted. Psychiatric: Mood and affect are normal. Speech and behavior are normal.  Normal judgement  ____________________________________________   LABS (all labs ordered are listed, but only abnormal results are displayed)  Labs Reviewed - No data to display ____________________________________________  EKG  Not indicated ____________________________________________  RADIOLOGY  No results found.  ____________________________________________   PROCEDURES  Procedure(s) performed: None  Procedures  Critical Care performed: No ____________________________________________   INITIAL IMPRESSION / ASSESSMENT AND PLAN / ED  COURSE  Pertinent labs & imaging results that were available during my care of the patient were reviewed by me and considered in my medical decision making (see chart for details).  67 y.o. female with a history of hypertension, chronic migraines, A. fib on Eliquis, presenting for typical migraine.  Overall, the patient is mildly hypertensive but otherwise hemodynamically stable.  She has no focal neurologic deficits on examination.  The most likely etiology of her symptoms today is migraine.  I will plan to treat her for her migraine and reevaluate her.  At this time, no additional operatory testing or imaging is indicated.  I have reviewed  her chart and she recently had a CT of the head on 04/17/2018 which did not show any acute intracranial abnormalities.  Plan reevaluation for final disposition.  ____________________________________________  FINAL CLINICAL IMPRESSION(S) / ED DIAGNOSES  Final diagnoses:  Migraine without status migrainosus, not intractable, unspecified migraine type         NEW MEDICATIONS STARTED DURING THIS VISIT:  New Prescriptions   No medications on file      Eula Listen, MD 05/30/18 2003

## 2018-05-31 ENCOUNTER — Other Ambulatory Visit: Payer: Self-pay

## 2018-05-31 ENCOUNTER — Ambulatory Visit (HOSPITAL_BASED_OUTPATIENT_CLINIC_OR_DEPARTMENT_OTHER): Payer: Medicare Other | Admitting: Pain Medicine

## 2018-05-31 ENCOUNTER — Encounter: Payer: Self-pay | Admitting: Pain Medicine

## 2018-05-31 ENCOUNTER — Ambulatory Visit
Admission: RE | Admit: 2018-05-31 | Discharge: 2018-05-31 | Disposition: A | Discharge status: Home / Self Care | Payer: Medicare Other | Source: Ambulatory Visit | Attending: Pain Medicine | Admitting: Pain Medicine

## 2018-05-31 VITALS — BP 153/74 | HR 57 | Temp 98.1°F | Resp 13 | Ht 65.0 in | Wt 315.0 lb

## 2018-05-31 DIAGNOSIS — M25551 Pain in right hip: Secondary | ICD-10-CM | POA: Insufficient documentation

## 2018-05-31 DIAGNOSIS — Z6841 Body Mass Index (BMI) 40.0 and over, adult: Secondary | ICD-10-CM | POA: Diagnosis not present

## 2018-05-31 DIAGNOSIS — M1611 Unilateral primary osteoarthritis, right hip: Secondary | ICD-10-CM

## 2018-05-31 DIAGNOSIS — E669 Obesity, unspecified: Secondary | ICD-10-CM | POA: Insufficient documentation

## 2018-05-31 DIAGNOSIS — E66812 Obesity, class 2: Secondary | ICD-10-CM | POA: Insufficient documentation

## 2018-05-31 DIAGNOSIS — Z7901 Long term (current) use of anticoagulants: Secondary | ICD-10-CM | POA: Diagnosis not present

## 2018-05-31 DIAGNOSIS — Z91040 Latex allergy status: Secondary | ICD-10-CM | POA: Insufficient documentation

## 2018-05-31 DIAGNOSIS — Z888 Allergy status to other drugs, medicaments and biological substances status: Secondary | ICD-10-CM | POA: Insufficient documentation

## 2018-05-31 DIAGNOSIS — Z885 Allergy status to narcotic agent status: Secondary | ICD-10-CM | POA: Diagnosis not present

## 2018-05-31 DIAGNOSIS — Z79899 Other long term (current) drug therapy: Secondary | ICD-10-CM | POA: Insufficient documentation

## 2018-05-31 DIAGNOSIS — G8929 Other chronic pain: Secondary | ICD-10-CM

## 2018-05-31 MED ORDER — LIDOCAINE HCL 2 % IJ SOLN
20.0000 mL | Freq: Once | INTRAMUSCULAR | Status: AC
  Administered 2018-05-31: 400 mg
  Filled 2018-05-31: qty 40

## 2018-05-31 MED ORDER — METHYLPREDNISOLONE ACETATE 80 MG/ML IJ SUSP
80.0000 mg | Freq: Once | INTRAMUSCULAR | Status: AC
  Administered 2018-05-31: 80 mg via INTRA_ARTICULAR
  Filled 2018-05-31: qty 1

## 2018-05-31 MED ORDER — FENTANYL CITRATE (PF) 100 MCG/2ML IJ SOLN
25.0000 ug | INTRAMUSCULAR | Status: DC | PRN
  Administered 2018-05-31: 50 ug via INTRAVENOUS
  Filled 2018-05-31: qty 2

## 2018-05-31 MED ORDER — IOPAMIDOL (ISOVUE-M 200) INJECTION 41%
10.0000 mL | Freq: Once | INTRAMUSCULAR | Status: AC
  Administered 2018-05-31: 10 mL via EPIDURAL
  Filled 2018-05-31: qty 10

## 2018-05-31 MED ORDER — ROPIVACAINE HCL 2 MG/ML IJ SOLN
4.0000 mL | Freq: Once | INTRAMUSCULAR | Status: AC
  Administered 2018-05-31: 4 mL via INTRA_ARTICULAR
  Filled 2018-05-31: qty 10

## 2018-05-31 MED ORDER — MIDAZOLAM HCL 5 MG/5ML IJ SOLN
1.0000 mg | INTRAMUSCULAR | Status: DC | PRN
  Administered 2018-05-31: 1 mg via INTRAVENOUS
  Filled 2018-05-31: qty 5

## 2018-05-31 MED ORDER — LACTATED RINGERS IV SOLN
1000.0000 mL | Freq: Once | INTRAVENOUS | Status: AC
  Administered 2018-05-31: 1000 mL via INTRAVENOUS

## 2018-05-31 NOTE — Progress Notes (Signed)
Safety precautions to be maintained throughout the outpatient stay will include: orient to surroundings, keep bed in low position, maintain call bell within reach at all times, provide assistance with transfer out of bed and ambulation.  

## 2018-06-01 ENCOUNTER — Telehealth: Payer: Self-pay

## 2018-06-01 ENCOUNTER — Encounter: Payer: Self-pay | Admitting: Family

## 2018-06-01 ENCOUNTER — Ambulatory Visit (INDEPENDENT_AMBULATORY_CARE_PROVIDER_SITE_OTHER): Payer: Medicare Other | Admitting: Family

## 2018-06-01 DIAGNOSIS — G43109 Migraine with aura, not intractable, without status migrainosus: Secondary | ICD-10-CM | POA: Diagnosis not present

## 2018-06-01 MED ORDER — ONDANSETRON 4 MG PO TBDP: 4.0000 mg | ORAL_TABLET | Freq: Three times a day (TID) | ORAL | 1 refills | Status: DC | PRN

## 2018-06-01 NOTE — Assessment & Plan Note (Signed)
Reassuring neurologic exam.  Unable to give Toradol injection in office as patient is on Eliquis.  Will do trial of Zofran to see if this helps break cycle.  Also message her neurologist for other advice.  Patient will let me know how she is doing

## 2018-06-01 NOTE — Patient Instructions (Addendum)
Lets continue wellbutrin for 8 weeks - however if improvement, we will need to stop wellbutrin.   I have sent in Zofran for nausea, this will also hopefully help you to sleep which will hopefully be the best medicine of all for your headache.   I sent in 4 mg under the tongue tablet.  This is the lower dose.  If you feel like the 8 mg dose (or taking 2 tablets) would me more beneficial, let me know.     We will wait on [place] for further advice.

## 2018-06-01 NOTE — Telephone Encounter (Signed)
Post procedure phone call. Patient states she is doing good.  

## 2018-06-01 NOTE — Progress Notes (Signed)
Subjective:    Patient ID: Amber Barnes, female    DOB: 28-Mar-1951, 67 y.o.   MRN: [id number]  CC: Amber Barnes is a 67 y.o. female who presents today for follow up.   HPI: Frontal HA 6 days ago, unchanged.  Photosensitive. Nausea. No vomiting, fever. Medications in ED helped her sleep however HA continued.Thinks if could 'just sleep', it would go away.   Not worse HA of life. Feels like HA in the past.   Maxalt hasnt worked this time.  Dr Jaynee Eagles gave her this. Normally maxalt works with excredrin migraine however thinks didn't take it soon enough. hasnt taken excredrin this time.    Working on weight loss. Has lost 17 pounds. Going to gastric surgery and orthopedics.   On eliquis however held for 3 days for injection , plans to restart today.   No CP.   Follows with pain management for chronic hip pain, right hip injection yesterday.    Presented to the emergency room on 11/20 for headache, elevated blood pressure. No imaging done. Given  Hydromorphone HISTORY:  Past Medical History:  Diagnosis Date  . Achilles tendinitis   . Acute medial meniscal tear   . Allergy   . Anxiety   . Arthritis   . Atrial fibrillation ([place])    a. new onset 07/2016; b. CHADS2VASc --> 2 (HTN, female); c. eliquis  . CHF (congestive heart failure) ([place])   . Chronic lower back pain   . COPD (chronic obstructive pulmonary disease) ([place])   . Depression   . Fibrocystic breast disease   . GERD (gastroesophageal reflux disease)   . Hyperlipidemia   . Hypertension   . Insomnia   . Lipoma   . Migraine   . Osteoarthritis    left knee  . Osteopenia   . Tendonitis   . Thyroid disease   . Vitamin D deficiency    Past Surgical History:  Procedure Laterality Date  . ABDOMINAL HYSTERECTOMY     ovaries left  . ACHILLES TENDON SURGERY     2012,01/2017  . BACK SURGERY    . CARDIOVERSION N/A 09/07/2016   Procedure: CARDIOVERSION;  Surgeon: Minna Merritts, MD;  Location: [facility] ORS;  Service:  Cardiovascular;  Laterality: N/A;  . CARDIOVERSION N/A 10/19/2016   Procedure: CARDIOVERSION;  Surgeon: Minna Merritts, MD;  Location: [facility] ORS;  Service: Cardiovascular;  Laterality: N/A;  . CARDIOVERSION N/A 12/14/2016   Procedure: CARDIOVERSION;  Surgeon: Minna Merritts, MD;  Location: [facility] ORS;  Service: Cardiovascular;  Laterality: N/A;  . COLONOSCOPY    . ESOPHAGOGASTRODUODENOSCOPY    . THYROIDECTOMY     thyroid lobectomy secondary to a benign nodule   Family History  Problem Relation Age of Onset  . Cancer Sister 53       ovarian  . Depression Sister   . Cancer Father 23       colon  . Heart disease Father   . Heart attack Father 82       died from MI  . Hyperlipidemia Father   . Hypertension Father   . Cancer Mother        leukemia  . Breast cancer Paternal Aunt        53's  . Migraines Neg Hx     Allergies: Prednisone; Hydrocodone-acetaminophen; Oxycodone; Tramadol; Adhesive [tape]; and Latex Current Outpatient Medications on File Prior to Visit  Medication Sig Dispense Refill  . amiodarone (PACERONE) 200 MG tablet Take 1 tablet (200 mg total) by  mouth daily. 90 tablet 3  . B Complex Vitamins (VITAMIN B COMPLEX PO) Take 1 tablet by mouth daily.     . bisoprolol (ZEBETA) 5 MG tablet Take 1 tablet (5 mg total) by mouth daily. 90 tablet 3  . buPROPion (WELLBUTRIN XL) 150 MG 24 hr tablet Start 150 mg ER PO qam, increase after 3 days to 300 mg qam. 60 tablet 3  . calcium gluconate 500 MG tablet Take 500 tablets by mouth daily.    . Cholecalciferol (VITAMIN D3) 5000 units CAPS Take 5,000 Units by mouth daily.    Marland Kitchen ELIQUIS 5 MG TABS tablet TAKE 1 TABLET BY MOUTH TWICE A DAY 60 tablet 3  . esomeprazole (NEXIUM) 20 MG capsule Take 20 mg by mouth daily as needed (acid reflux).    . furosemide (LASIX) 20 MG tablet TAKE 1 TABLET (20 MG TOTAL) BY MOUTH 2 (TWO) TIMES DAILY. 180 tablet 3  . gabapentin (NEURONTIN) 300 MG capsule Take 1-3 capsules (300-900 mg total) by mouth 4  (four) times daily. 360 capsule 2  . hydrALAZINE (APRESOLINE) 25 MG tablet Take 1 tablet (25 mg total) by mouth 4 (four) times daily. 120 tablet 11  . hydrOXYzine (ATARAX/VISTARIL) 10 MG tablet Take 2 tablets (20 mg total) by mouth 2 (two) times daily as needed. 120 tablet 1  . KLOR-CON 10 10 MEQ tablet TAKE 1 TABLET BY MOUTH TWICE A DAY 180 tablet 2  . losartan (COZAAR) 25 MG tablet TAKE 1 TABLET BY MOUTH EVERY DAY 30 tablet 6  . meclizine (ANTIVERT) 25 MG tablet Take 1 tablet (25 mg total) by mouth 3 (three) times daily as needed for dizziness or nausea. 30 tablet 1  . Multiple Vitamin (MULTIVITAMIN) tablet Take 1 tablet by mouth daily.    Marland Kitchen oxymetazoline (AFRIN) 0.05 % nasal spray Place 1 spray into both nostrils at bedtime as needed for congestion.     . ranolazine (RANEXA) 500 MG 12 hr tablet TAKE 1 TABLET BY MOUTH TWICE A DAY 60 tablet 3  . rizatriptan (MAXALT) 10 MG tablet TAKE 1 TABLET (10 MG TOTAL) BY MOUTH AS NEEDED FOR MIGRAINE. MAY REPEAT IN 2 HOURS IF NEEDED 10 tablet 0  . rosuvastatin (CRESTOR) 20 MG tablet TAKE 1 TABLET BY MOUTH EVERY DAY 90 tablet 0   No current facility-administered medications on file prior to visit.     Social History   Tobacco Use  . Smoking status: Former Smoker    Packs/day: 0.25    Years: 10.00    Pack years: 2.50    Types: Cigarettes    Last attempt to quit: 2005    Years since quitting: 14.8  . Smokeless tobacco: Former Systems developer    Quit date: 07/11/2001  . Tobacco comment: smoked for 10 years, 1 pack every 2-3 days  Substance Use Topics  . Alcohol use: Not Currently    Comment: social, occasional  . Drug use: No    Review of Systems  Constitutional: Negative for chills and fever.  Eyes: Positive for photophobia. Negative for visual disturbance.  Respiratory: Negative for cough.   Cardiovascular: Negative for chest pain and palpitations.  Gastrointestinal: Negative for nausea and vomiting.  Neurological: Positive for headaches.        Objective:    BP 130/70 (BP Location: Left Arm, Cuff Size: Large)   Pulse 75   Temp 97.9 F (36.6 C) (Oral)   Resp 15   Wt (!) 320 lb 6 oz (145.3 kg)   SpO2  94%   BMI 53.31 kg/m  BP Readings from Last 3 Encounters:  06/01/18 130/70  05/31/18 (!) 153/74  05/30/18 135/73   Wt Readings from Last 3 Encounters:  06/01/18 (!) 320 lb 6 oz (145.3 kg)  05/31/18 (!) 315 lb (142.9 kg)  05/30/18 (!) 315 lb (142.9 kg)    Physical Exam  Constitutional: She appears well-developed and well-nourished.  HENT:  Mouth/Throat: Uvula is midline, oropharynx is clear and moist and mucous membranes are normal.  Eyes: Pupils are equal, round, and reactive to light. Conjunctivae and EOM are normal.  Fundus normal bilaterally.   Cardiovascular: Normal rate, regular rhythm, normal heart sounds and normal pulses.  Pulmonary/Chest: Effort normal and breath sounds normal. She has no wheezes. She has no rhonchi. She has no rales.  Neurological: She is alert. She has normal strength. No cranial nerve deficit or sensory deficit. She displays a negative Romberg sign.  Reflex Scores:      Bicep reflexes are 2+ on the right side and 2+ on the left side.      Patellar reflexes are 2+ on the right side and 2+ on the left side. Grip equal and strong bilateral upper extremities. Gait strong and steady. Able to perform rapid alternating movement without difficulty.   Skin: Skin is warm and dry.  Psychiatric: She has a normal mood and affect. Her speech is normal and behavior is normal. Thought content normal.  Vitals reviewed.      Assessment & Plan:   Problem List Items Addressed This Visit      Cardiovascular and Mediastinum   Migraine with aura and without status migrainosus, not intractable    Reassuring neurologic exam.  Unable to give Toradol injection in office as patient is on Eliquis.  Will do trial of Zofran to see if this helps break cycle.  Also message her neurologist for other advice.  Patient  will let me know how she is doing          I am having Amber Furl "[name]" start on ondansetron. I am also having her maintain her multivitamin, B Complex Vitamins (VITAMIN B COMPLEX PO), oxymetazoline, Vitamin D3, esomeprazole, calcium gluconate, amiodarone, bisoprolol, furosemide, ranolazine, losartan, hydrOXYzine, rizatriptan, ELIQUIS, meclizine, hydrALAZINE, buPROPion, gabapentin, KLOR-CON 10, and rosuvastatin.   Meds ordered this encounter  Medications  . ondansetron (ZOFRAN ODT) 4 MG disintegrating tablet    Sig: Take 1 tablet (4 mg total) by mouth every 8 (eight) hours as needed for nausea or vomiting.    Dispense:  30 tablet    Refill:  1    Order Specific Question:   Supervising Provider    Answer:   Crecencio Mc [[id number]]    Return precautions given.   Risks, benefits, and alternatives of the medications and treatment plan prescribed today were discussed, and patient expressed understanding.   Education regarding symptom management and diagnosis given to patient on AVS.  Continue to follow with [name], FNP for routine health maintenance.   Amber Furl and I agreed with plan.   [name], FNP

## 2018-06-03 ENCOUNTER — Encounter: Payer: Self-pay | Admitting: Family

## 2018-06-04 ENCOUNTER — Ambulatory Visit (INDEPENDENT_AMBULATORY_CARE_PROVIDER_SITE_OTHER): Payer: Medicare Other | Admitting: Family

## 2018-06-04 ENCOUNTER — Ambulatory Visit: Payer: Self-pay

## 2018-06-04 ENCOUNTER — Telehealth: Payer: Self-pay | Admitting: Family

## 2018-06-04 ENCOUNTER — Other Ambulatory Visit: Payer: Self-pay | Admitting: Psychiatry

## 2018-06-04 VITALS — BP 128/80 | HR 53

## 2018-06-04 DIAGNOSIS — F32A Depression, unspecified: Secondary | ICD-10-CM

## 2018-06-04 DIAGNOSIS — G894 Chronic pain syndrome: Secondary | ICD-10-CM

## 2018-06-04 DIAGNOSIS — F331 Major depressive disorder, recurrent, moderate: Secondary | ICD-10-CM

## 2018-06-04 DIAGNOSIS — R51 Headache: Secondary | ICD-10-CM

## 2018-06-04 DIAGNOSIS — F339 Major depressive disorder, recurrent, unspecified: Secondary | ICD-10-CM | POA: Diagnosis not present

## 2018-06-04 DIAGNOSIS — F329 Major depressive disorder, single episode, unspecified: Secondary | ICD-10-CM

## 2018-06-04 DIAGNOSIS — F419 Anxiety disorder, unspecified: Secondary | ICD-10-CM

## 2018-06-04 DIAGNOSIS — R519 Headache, unspecified: Secondary | ICD-10-CM

## 2018-06-04 DIAGNOSIS — F4310 Post-traumatic stress disorder, unspecified: Secondary | ICD-10-CM

## 2018-06-04 MED ORDER — ALPRAZOLAM 0.25 MG PO TABS: 0.2500 mg | ORAL_TABLET | Freq: Every day | ORAL | 0 refills | Status: DC | PRN

## 2018-06-04 MED ORDER — SERTRALINE HCL 50 MG PO TABS: 50.0000 mg | ORAL_TABLET | Freq: Every day | ORAL | 3 refills | Status: DC

## 2018-06-04 NOTE — Telephone Encounter (Signed)
Left message for patient to return call back. [facility] may give and obtain information.  

## 2018-06-04 NOTE — Telephone Encounter (Signed)
I called pt and left message to call us back  Please advise patient that is reasonable to trial decreasing or stopping altogether the Wellbutrin if she thinks is contributory to her headaches.  To wean off, she may start taking the Wellbutrin 150 mg tablet once a day in the morning for the next 5 days.   She then may take Wellbutrin 150 mg once every other day for the next week, at that point she may stop.  Her headaches improved?  Has it worsened? I am still awaiting a response from neurology. Has she been taking the Atarax?  I really would prefer to put her on a medication such as an SSRI to treat generalized anxiety, depression.  Benzodiazepines are not first-line for anxiety as they are very short acting they also can be addictive, and recent studies have shown associated with cognitive decline.  Furthermore, she is on sedating agents including gabapentin.    Please confirm if she is still on tramadol for pain management?  Is agreeable to trial of zoloft? This is an SSRI

## 2018-06-04 NOTE — Progress Notes (Signed)
Subjective:    Patient ID: Amber Barnes, female    DOB: July 08, 1951, 67 y.o.   MRN: [id number]  CC: Amber Barnes is a 67 y.o. female who presents today for follow up.   HPI: HA- not worse HA of her life. HA feels like HA's in the past however difference is primarily that it is 'not stopping'.  HA is a little better since reduced wellbutrin.  Cannot tell if Zofran is helping.No vision changes.   CBP - follows with pain management . Takes gabapentin 400 mg and 300mg  in the morning ; and also at bedtime.  She does not feel particularly sedated on this regimen.  No longer on tramadol due to itching.   Depression and anxiety- Had been on zoloft in the past 5 years ago. Doesn't recall dose. Doesn't plan to go back to psychiatry at this time. No si/hi.   Seeing Alene Mires every 3 weeks.    Would like aprazalam, had been taking .  Tried hydrazaine 2 in the morning and 2 at night which has not helped anxiety.   Taking 75 mg trazodone and getting nightmares. Feels like on too many medications and not working.   No alcohol use.     HISTORY:  Past Medical History:  Diagnosis Date  . Achilles tendinitis   . Acute medial meniscal tear   . Allergy   . Anxiety   . Arthritis   . Atrial fibrillation ([place])    a. new onset 07/2016; b. CHADS2VASc --> 2 (HTN, female); c. eliquis  . CHF (congestive heart failure) ([place])   . Chronic lower back pain   . COPD (chronic obstructive pulmonary disease) ([place])   . Depression   . Fibrocystic breast disease   . GERD (gastroesophageal reflux disease)   . Hyperlipidemia   . Hypertension   . Insomnia   . Lipoma   . Migraine   . Osteoarthritis    left knee  . Osteopenia   . Tendonitis   . Thyroid disease   . Vitamin D deficiency    Past Surgical History:  Procedure Laterality Date  . ABDOMINAL HYSTERECTOMY     ovaries left  . ACHILLES TENDON SURGERY     2012,01/2017  . BACK SURGERY    . CARDIOVERSION N/A 09/07/2016   Procedure:  CARDIOVERSION;  Surgeon: Minna Merritts, MD;  Location: [facility] ORS;  Service: Cardiovascular;  Laterality: N/A;  . CARDIOVERSION N/A 10/19/2016   Procedure: CARDIOVERSION;  Surgeon: Minna Merritts, MD;  Location: [facility] ORS;  Service: Cardiovascular;  Laterality: N/A;  . CARDIOVERSION N/A 12/14/2016   Procedure: CARDIOVERSION;  Surgeon: Minna Merritts, MD;  Location: [facility] ORS;  Service: Cardiovascular;  Laterality: N/A;  . COLONOSCOPY    . ESOPHAGOGASTRODUODENOSCOPY    . THYROIDECTOMY     thyroid lobectomy secondary to a benign nodule   Family History  Problem Relation Age of Onset  . Cancer Sister 80       ovarian  . Depression Sister   . Cancer Father 10       colon  . Heart disease Father   . Heart attack Father 40       died from MI  . Hyperlipidemia Father   . Hypertension Father   . Cancer Mother        leukemia  . Breast cancer Paternal Aunt        24's  . Migraines Neg Hx     Allergies: Prednisone; Hydrocodone-acetaminophen; Oxycodone; Tramadol; Adhesive [tape]; and Latex  Current Outpatient Medications on File Prior to Visit  Medication Sig Dispense Refill  . amiodarone (PACERONE) 200 MG tablet Take 1 tablet (200 mg total) by mouth daily. 90 tablet 3  . B Complex Vitamins (VITAMIN B COMPLEX PO) Take 1 tablet by mouth daily.     . bisoprolol (ZEBETA) 5 MG tablet Take 1 tablet (5 mg total) by mouth daily. 90 tablet 3  . calcium gluconate 500 MG tablet Take 500 tablets by mouth daily.    . Cholecalciferol (VITAMIN D3) 5000 units CAPS Take 5,000 Units by mouth daily.    Marland Kitchen ELIQUIS 5 MG TABS tablet TAKE 1 TABLET BY MOUTH TWICE A DAY 60 tablet 3  . esomeprazole (NEXIUM) 20 MG capsule Take 20 mg by mouth daily as needed (acid reflux).    . furosemide (LASIX) 20 MG tablet TAKE 1 TABLET (20 MG TOTAL) BY MOUTH 2 (TWO) TIMES DAILY. 180 tablet 3  . gabapentin (NEURONTIN) 300 MG capsule Take 1-3 capsules (300-900 mg total) by mouth 4 (four) times daily. 360 capsule 2  . hydrALAZINE  (APRESOLINE) 25 MG tablet Take 1 tablet (25 mg total) by mouth 4 (four) times daily. 120 tablet 11  . KLOR-CON 10 10 MEQ tablet TAKE 1 TABLET BY MOUTH TWICE A DAY 180 tablet 2  . losartan (COZAAR) 25 MG tablet TAKE 1 TABLET BY MOUTH EVERY DAY 30 tablet 6  . meclizine (ANTIVERT) 25 MG tablet Take 1 tablet (25 mg total) by mouth 3 (three) times daily as needed for dizziness or nausea. 30 tablet 1  . Multiple Vitamin (MULTIVITAMIN) tablet Take 1 tablet by mouth daily.    Marland Kitchen oxymetazoline (AFRIN) 0.05 % nasal spray Place 1 spray into both nostrils at bedtime as needed for congestion.     . ranolazine (RANEXA) 500 MG 12 hr tablet TAKE 1 TABLET BY MOUTH TWICE A DAY 60 tablet 3  . rizatriptan (MAXALT) 10 MG tablet TAKE 1 TABLET (10 MG TOTAL) BY MOUTH AS NEEDED FOR MIGRAINE. MAY REPEAT IN 2 HOURS IF NEEDED 10 tablet 0  . rosuvastatin (CRESTOR) 20 MG tablet TAKE 1 TABLET BY MOUTH EVERY DAY 90 tablet 0   No current facility-administered medications on file prior to visit.     Social History   Tobacco Use  . Smoking status: Former Smoker    Packs/day: 0.25    Years: 10.00    Pack years: 2.50    Types: Cigarettes    Last attempt to quit: 2005    Years since quitting: 14.9  . Smokeless tobacco: Former Systems developer    Quit date: 07/11/2001  . Tobacco comment: smoked for 10 years, 1 pack every 2-3 days  Substance Use Topics  . Alcohol use: Not Currently    Comment: social, occasional  . Drug use: No    Review of Systems  Constitutional: Negative for chills and fever.  Eyes: Negative for visual disturbance.  Respiratory: Negative for cough.   Cardiovascular: Negative for chest pain and palpitations.  Gastrointestinal: Negative for nausea and vomiting.  Neurological: Positive for headaches. Negative for dizziness.  Psychiatric/Behavioral: Positive for sleep disturbance.      Objective:    BP 128/80   Pulse (!) 53   SpO2 93%  BP Readings from Last 3 Encounters:  06/04/18 128/80  06/01/18 130/70    05/31/18 (!) 153/74   Wt Readings from Last 3 Encounters:  06/01/18 (!) 320 lb 6 oz (145.3 kg)  05/31/18 (!) 315 lb (142.9 kg)  05/30/18 (!) 315  lb (142.9 kg)    Physical Exam  Constitutional: She appears well-developed and well-nourished.  Eyes: Conjunctivae are normal.  Cardiovascular: Normal rate, regular rhythm, normal heart sounds and normal pulses.  Pulmonary/Chest: Effort normal and breath sounds normal. She has no wheezes. She has no rhonchi. She has no rales.  Neurological: She is alert.  Skin: Skin is warm and dry.  Psychiatric: She has a normal mood and affect. Her speech is normal and behavior is normal. Thought content normal.  Vitals reviewed.      Assessment & Plan:   Problem List Items Addressed This Visit      Other   Chronic pain syndrome (Chronic)    She is currently following a pain management, I will continue to follow.      Anxiety and depression - Primary    Wean off wellbutrin as concern exaberated HA, anxiety. Start zoloft. Stay on trazodone. Resume xanax for cautious prn use. I have given her 10 tablets.  Controlled substance contract in place.  I looked up patient on [place] Controlled Substances Reporting System and saw no activity that raised concern of inappropriate use. Close follow up.        Relevant Medications   sertraline (ZOLOFT) 50 MG tablet   ALPRAZolam (XANAX) 0.25 MG tablet   Depression, recurrent (HCC)    Agree with patient that we needed to look again at her medications.  I agree to resume care if she was not to follow with psychiatry anymore.  Will restart Zoloft.  Have given her 10 tablets of Xanax to use very sparingly.  Advised her to go ahead and stop Atarax, Wellbutrin, trazodone if these medications seem not to be working enough concern for polypharmacy, sedation.Close follow up  I looked up patient on [place] Controlled Substances Reporting System and saw no activity that raised concern of inappropriate use.        Relevant  Medications   sertraline (ZOLOFT) 50 MG tablet   ALPRAZolam (XANAX) 0.25 MG tablet   Headache    Improved.  Reassured as it feels like headache she has have in the past .  advised patient that she is to continue to follow-up with neurology.  She will call and schedule this appointment.  We will continue to follow patient in regards to the symptom.      Relevant Medications   sertraline (ZOLOFT) 50 MG tablet       I have discontinued [name] "Jackie"'s hydrOXYzine, buPROPion, and ondansetron. I am also having her start on sertraline and ALPRAZolam. Additionally, I am having her maintain her multivitamin, B Complex Vitamins (VITAMIN B COMPLEX PO), oxymetazoline, Vitamin D3, esomeprazole, calcium gluconate, amiodarone, bisoprolol, furosemide, ranolazine, losartan, rizatriptan, ELIQUIS, meclizine, hydrALAZINE, gabapentin, KLOR-CON 10, and rosuvastatin.   Meds ordered this encounter  Medications  . sertraline (ZOLOFT) 50 MG tablet    Sig: Take 1 tablet (50 mg total) by mouth at bedtime.    Dispense:  90 tablet    Refill:  3    Order Specific Question:   Supervising Provider    Answer:   Deborra Medina L [[id number]]  . ALPRAZolam (XANAX) 0.25 MG tablet    Sig: Take 1 tablet (0.25 mg total) by mouth daily as needed for anxiety.    Dispense:  10 tablet    Refill:  0    Order Specific Question:   Supervising Provider    Answer:   Crecencio Mc [[id number]]    Return precautions given.   Risks, benefits,  and alternatives of the medications and treatment plan prescribed today were discussed, and patient expressed understanding.   Education regarding symptom management and diagnosis given to patient on AVS.  Continue to follow with [name], FNP for routine health maintenance.   [name] and I agreed with plan.   [name], FNP

## 2018-06-04 NOTE — Assessment & Plan Note (Signed)
Wean off wellbutrin as concern exaberated HA, anxiety. Start zoloft. Stay on trazodone. Resume xanax for cautious prn use. I have given her 10 tablets.  Controlled substance contract in place.  I looked up patient on [place] Controlled Substances Reporting System and saw no activity that raised concern of inappropriate use. Close follow up.

## 2018-06-04 NOTE — Telephone Encounter (Signed)
Pt states that her headaches are worse. She quit the Atarax because it wasn't helping She itches when she takes Tramadol so she quit taking it She has taken Zoloft in the past and it did not help but she is willing to try again. The only medication that does work is the benzodiazapine.  Pt states she doesn't feel that anyone is helping her.  Please respond back to patient via My Chart. Reason for Disposition . [1] Follow-up call to recent contact AND [2] information only call, no triage required  Answer Assessment - Initial Assessment Questions 1. REASON FOR CALL or QUESTION: "What is your reason for calling today?" or "How can I best help you?" or "What question do you have that I can help answer?"     Pt read note by Dr Vidal Schwalbe in 06/04/18  Protocols used: INFORMATION ONLY CALL-A-AH

## 2018-06-04 NOTE — Telephone Encounter (Signed)
Left message  See prior note in regarding to my thoughts/ questions after patient's mychart message

## 2018-06-04 NOTE — Patient Instructions (Addendum)
Start zoloft.   Wean off the wellbutrin - you may start taking 150mg  wellbutrin every other day. Do this for one week and then stop medication.   May stop the atarax.   Will give you 10 tablets of xanax; use sparingly. Keep in safe place. Do not take xanax with alcohol. Be careful as sedating

## 2018-06-05 NOTE — Telephone Encounter (Addendum)
Called patient to follow up from visit .  She feels better after office visit yesterday.

## 2018-06-05 NOTE — Telephone Encounter (Signed)
Thanks for circling back on Amber Barnes. She actually came in late yesterday afternoon and I saw her in a an office visit. I addressed her concerns regarding the below telephone note.  Please ensure there are not any new telephone calls from her today. However I did address her telephone call and my chart message in our  office visit.

## 2018-06-05 NOTE — Telephone Encounter (Signed)
Please advise 

## 2018-06-06 ENCOUNTER — Encounter: Payer: Self-pay | Admitting: Family

## 2018-06-06 DIAGNOSIS — R519 Headache, unspecified: Secondary | ICD-10-CM | POA: Insufficient documentation

## 2018-06-06 DIAGNOSIS — R51 Headache: Secondary | ICD-10-CM

## 2018-06-06 NOTE — Assessment & Plan Note (Signed)
Improved.  Reassured as it feels like headache she has have in the past .  advised patient that she is to continue to follow-up with neurology.  She will call and schedule this appointment.  We will continue to follow patient in regards to the symptom.

## 2018-06-06 NOTE — Assessment & Plan Note (Signed)
She is currently following a pain management, I will continue to follow.

## 2018-06-06 NOTE — Telephone Encounter (Signed)
Noted. Happy to hear. °

## 2018-06-06 NOTE — Assessment & Plan Note (Addendum)
Agree with patient that we needed to look again at her medications.  I agree to resume care if she was not to follow with psychiatry anymore.  Will restart Zoloft.  Have given her 10 tablets of Xanax to use very sparingly.  Advised her to go ahead and stop Atarax, Wellbutrin, trazodone if these medications seem not to be working enough concern for polypharmacy, sedation.Close follow up  I looked up patient on [place] Controlled Substances Reporting System and saw no activity that raised concern of inappropriate use.

## 2018-06-11 ENCOUNTER — Ambulatory Visit: Payer: Medicare Other | Admitting: Psychology

## 2018-06-11 DIAGNOSIS — F322 Major depressive disorder, single episode, severe without psychotic features: Secondary | ICD-10-CM

## 2018-06-11 DIAGNOSIS — F418 Other specified anxiety disorders: Secondary | ICD-10-CM

## 2018-06-12 ENCOUNTER — Other Ambulatory Visit: Payer: Self-pay | Admitting: Pain Medicine

## 2018-06-12 DIAGNOSIS — G2581 Restless legs syndrome: Secondary | ICD-10-CM

## 2018-06-12 DIAGNOSIS — M792 Neuralgia and neuritis, unspecified: Secondary | ICD-10-CM

## 2018-06-14 ENCOUNTER — Ambulatory Visit
Admission: RE | Admit: 2018-06-14 | Discharge: 2018-06-14 | Disposition: A | Discharge status: Home / Self Care | Payer: Medicare Other | Source: Ambulatory Visit | Attending: Nurse Practitioner | Admitting: Nurse Practitioner

## 2018-06-14 ENCOUNTER — Other Ambulatory Visit: Payer: Self-pay

## 2018-06-14 ENCOUNTER — Encounter: Payer: Self-pay | Admitting: Nurse Practitioner

## 2018-06-14 ENCOUNTER — Ambulatory Visit: Payer: Medicare Other | Admitting: Nurse Practitioner

## 2018-06-14 VITALS — BP 156/88 | HR 88 | Temp 97.7°F | Ht 65.0 in | Wt 320.0 lb

## 2018-06-14 DIAGNOSIS — M5412 Radiculopathy, cervical region: Secondary | ICD-10-CM | POA: Insufficient documentation

## 2018-06-14 DIAGNOSIS — M8580 Other specified disorders of bone density and structure, unspecified site: Secondary | ICD-10-CM | POA: Insufficient documentation

## 2018-06-14 DIAGNOSIS — M25512 Pain in left shoulder: Secondary | ICD-10-CM | POA: Insufficient documentation

## 2018-06-14 DIAGNOSIS — E079 Disorder of thyroid, unspecified: Secondary | ICD-10-CM

## 2018-06-14 DIAGNOSIS — J449 Chronic obstructive pulmonary disease, unspecified: Secondary | ICD-10-CM | POA: Insufficient documentation

## 2018-06-14 DIAGNOSIS — M25551 Pain in right hip: Secondary | ICD-10-CM

## 2018-06-14 DIAGNOSIS — R200 Anesthesia of skin: Secondary | ICD-10-CM

## 2018-06-14 DIAGNOSIS — G2581 Restless legs syndrome: Secondary | ICD-10-CM

## 2018-06-14 DIAGNOSIS — Z7901 Long term (current) use of anticoagulants: Secondary | ICD-10-CM | POA: Insufficient documentation

## 2018-06-14 DIAGNOSIS — Z8041 Family history of malignant neoplasm of ovary: Secondary | ICD-10-CM

## 2018-06-14 DIAGNOSIS — M79604 Pain in right leg: Secondary | ICD-10-CM | POA: Insufficient documentation

## 2018-06-14 DIAGNOSIS — K760 Fatty (change of) liver, not elsewhere classified: Secondary | ICD-10-CM

## 2018-06-14 DIAGNOSIS — M533 Sacrococcygeal disorders, not elsewhere classified: Secondary | ICD-10-CM | POA: Insufficient documentation

## 2018-06-14 DIAGNOSIS — E876 Hypokalemia: Secondary | ICD-10-CM | POA: Insufficient documentation

## 2018-06-14 DIAGNOSIS — G8929 Other chronic pain: Secondary | ICD-10-CM | POA: Diagnosis present

## 2018-06-14 DIAGNOSIS — M48061 Spinal stenosis, lumbar region without neurogenic claudication: Secondary | ICD-10-CM | POA: Insufficient documentation

## 2018-06-14 DIAGNOSIS — Z79899 Other long term (current) drug therapy: Secondary | ICD-10-CM

## 2018-06-14 DIAGNOSIS — G4733 Obstructive sleep apnea (adult) (pediatric): Secondary | ICD-10-CM

## 2018-06-14 DIAGNOSIS — M47897 Other spondylosis, lumbosacral region: Secondary | ICD-10-CM | POA: Insufficient documentation

## 2018-06-14 DIAGNOSIS — F419 Anxiety disorder, unspecified: Secondary | ICD-10-CM | POA: Insufficient documentation

## 2018-06-14 DIAGNOSIS — Z87891 Personal history of nicotine dependence: Secondary | ICD-10-CM

## 2018-06-14 DIAGNOSIS — G894 Chronic pain syndrome: Secondary | ICD-10-CM | POA: Insufficient documentation

## 2018-06-14 DIAGNOSIS — F329 Major depressive disorder, single episode, unspecified: Secondary | ICD-10-CM

## 2018-06-14 DIAGNOSIS — I110 Hypertensive heart disease with heart failure: Secondary | ICD-10-CM | POA: Insufficient documentation

## 2018-06-14 DIAGNOSIS — I4819 Other persistent atrial fibrillation: Secondary | ICD-10-CM

## 2018-06-14 DIAGNOSIS — R1030 Lower abdominal pain, unspecified: Secondary | ICD-10-CM | POA: Insufficient documentation

## 2018-06-14 DIAGNOSIS — G43909 Migraine, unspecified, not intractable, without status migrainosus: Secondary | ICD-10-CM | POA: Insufficient documentation

## 2018-06-14 DIAGNOSIS — K219 Gastro-esophageal reflux disease without esophagitis: Secondary | ICD-10-CM

## 2018-06-14 DIAGNOSIS — E782 Mixed hyperlipidemia: Secondary | ICD-10-CM | POA: Insufficient documentation

## 2018-06-14 DIAGNOSIS — R29898 Other symptoms and signs involving the musculoskeletal system: Secondary | ICD-10-CM | POA: Diagnosis not present

## 2018-06-14 DIAGNOSIS — G4700 Insomnia, unspecified: Secondary | ICD-10-CM | POA: Insufficient documentation

## 2018-06-14 DIAGNOSIS — M79672 Pain in left foot: Secondary | ICD-10-CM | POA: Insufficient documentation

## 2018-06-14 DIAGNOSIS — R202 Paresthesia of skin: Secondary | ICD-10-CM

## 2018-06-14 DIAGNOSIS — M792 Neuralgia and neuritis, unspecified: Secondary | ICD-10-CM

## 2018-06-14 DIAGNOSIS — M5441 Lumbago with sciatica, right side: Secondary | ICD-10-CM

## 2018-06-14 DIAGNOSIS — Z6841 Body Mass Index (BMI) 40.0 and over, adult: Secondary | ICD-10-CM

## 2018-06-14 DIAGNOSIS — M1611 Unilateral primary osteoarthritis, right hip: Secondary | ICD-10-CM | POA: Insufficient documentation

## 2018-06-14 DIAGNOSIS — Z888 Allergy status to other drugs, medicaments and biological substances status: Secondary | ICD-10-CM

## 2018-06-14 DIAGNOSIS — Z5181 Encounter for therapeutic drug level monitoring: Secondary | ICD-10-CM

## 2018-06-14 DIAGNOSIS — I509 Heart failure, unspecified: Secondary | ICD-10-CM | POA: Insufficient documentation

## 2018-06-14 DIAGNOSIS — R531 Weakness: Secondary | ICD-10-CM

## 2018-06-14 DIAGNOSIS — E559 Vitamin D deficiency, unspecified: Secondary | ICD-10-CM | POA: Insufficient documentation

## 2018-06-14 DIAGNOSIS — Z8249 Family history of ischemic heart disease and other diseases of the circulatory system: Secondary | ICD-10-CM | POA: Insufficient documentation

## 2018-06-14 DIAGNOSIS — Z885 Allergy status to narcotic agent status: Secondary | ICD-10-CM

## 2018-06-14 NOTE — Patient Instructions (Signed)
  BMI Assessment: Estimated body mass index is 53.25 kg/m as calculated from the following:   Height as of this encounter: 5\' 5"  (1.651 m).   Weight as of this encounter: 320 lb (145.2 kg).  BMI interpretation table: BMI level Category Range association with higher incidence of chronic pain  <18 kg/m2 Underweight   18.5-24.9 kg/m2 Ideal body weight   25-29.9 kg/m2 Overweight Increased incidence by 20%  30-34.9 kg/m2 Obese (Class I) Increased incidence by 68%  35-39.9 kg/m2 Severe obesity (Class II) Increased incidence by 136%  >40 kg/m2 Extreme obesity (Class III) Increased incidence by 254%   Patient's current BMI Ideal Body weight  Body mass index is 53.25 kg/m. Ideal body weight: 57 kg (125 lb 10.6 oz) Adjusted ideal body weight: 92.3 kg (203 lb 6.4 oz)   BMI Readings from Last 4 Encounters:  06/14/18 53.25 kg/m  06/01/18 53.31 kg/m  05/31/18 52.42 kg/m  05/30/18 52.42 kg/m   Wt Readings from Last 4 Encounters:  06/14/18 (!) 320 lb (145.2 kg)  06/01/18 (!) 320 lb 6 oz (145.3 kg)  05/31/18 (!) 315 lb (142.9 kg)  05/30/18 (!) 315 lb (142.9 kg)

## 2018-06-14 NOTE — Progress Notes (Signed)
Results were reviewed and found to be: abnormal  No acute injury or pathology identified  Review would suggest interventional pain management techniques may be of benefit

## 2018-06-14 NOTE — Progress Notes (Signed)
Patient's Name: Amber Barnes  MRN: [id number]  Referring Provider: [name], FNP  DOB: 23-Oct-1950  PCP: [name], FNP  DOS: 06/14/2018  Note by: Vevelyn Francois NP  Service setting: Ambulatory outpatient  Specialty: Interventional Pain Management  Location: [facility] (AMB) Pain Management Facility    Patient type: Established    Primary Reason(s) for Visit: Encounter for prescription drug management & post-procedure evaluation of chronic illness with mild to moderate exacerbation(Level of risk: moderate) CC: Hip Pain  HPI  Amber Barnes is a 67 y.o. year old, female patient, who comes today for a post-procedure evaluation and medication management. She has Heel spur; Tendonitis; SOB (shortness of breath); Morbid obesity ([place]); Mixed hyperlipidemia; Essential hypertension; Anxiety and depression; Arthritis, senescent; GERD (gastroesophageal reflux disease); Seasonal rhinitis; Routine physical examination; Dizziness; Family history of ovarian cancer; Fatty liver; Hypokalemia; Left foot pain; Atypical chest pain; Persistent atrial fibrillation; Acute pulmonary edema ([place]); Acute pain of left shoulder; OSA (obstructive sleep apnea); Restless leg; Bradycardia; Depression, recurrent ([place]); Migraine with aura and without status migrainosus, not intractable; Atherosclerosis of aorta ([place]); Olecranon bursitis of left elbow; Cervical radiculopathy; Screening mammogram, encounter for; Numbness and tingling; Chronic groin pain (Primary Area of Pain) (Right); Chronic lower extremity pain (Secondary Area of Pain) (Right); Chronic hip pain [name] Area of Pain) (Right); Chronic low back pain (Fourth Area of Pain) (Bilateral) w/ sciatica (Right); Chronic knee pain (Bilateral); Chronic pain syndrome; Pharmacologic therapy; Disorder of skeletal system; Problems influencing health status; Lumbar facet syndrome (Bilateral) (R>L); Chronic sacroiliac joint pain (Left); Chronic anticoagulation (Eliquis);  Spondylosis without myelopathy or radiculopathy, lumbosacral region; Other specified dorsopathies, sacral and sacrococcygeal region; Osteoarthritis of hip (Right); History of allergy to latex; History of lumbar laminectomy; Failed back surgical syndrome; Chronic low back pain (Fourth Area of Pain) (Bilateral) (R>L); Abnormal MRI, lumbar spine; Lumbar facet arthropathy (Bilateral) (L>R); Lumbar lateral recess stenosis (L3-4, L4-5) (Left); Lumbar foraminal stenosis (L4-5) (Left); Lumbar central spinal stenosis (L4-5); Latex precautions, history of latex allergy; Neurogenic pain; Morbid obesity with BMI of 50.0-59.9, adult ([place]); and Headache on their problem list. Her primarily concern today is the Hip Pain  Pain Assessment: Location: Right Hip Radiating: pain radiaties down right leg Onset: More than a month ago Duration: Chronic pain Quality: Constant, Throbbing, Stabbing Severity: 8 /10 (subjective, self-reported pain score)  Note: Reported level is compatible with observation. Clinically the patient looks like a 3/10 A 3/10 is viewed as "Moderate" and described as significantly interfering with activities of daily living (ADL). It becomes difficult to feed, bathe, get dressed, get on and off the toilet or to perform personal hygiene functions. Difficult to get in and out of bed or a chair without assistance. Very distracting. With effort, it can be ignored when deeply involved in activities.       When using our objective Pain Scale, levels between 6 and 10/10 are said to belong in an emergency room, as it progressively worsens from a 6/10, described as severely limiting, requiring emergency care not usually available at an outpatient pain management facility. At a 6/10 level, communication becomes difficult and requires great effort. Assistance to reach the emergency department may be required. Facial flushing and profuse sweating along with potentially dangerous increases in heart rate and blood  pressure will be evident. Effect on ADL: pain limits my daily activities Timing: Constant Modifying factors: procedures for a while BP: (!) 156/88  HR: 88  [name] was last seen on 06/12/2018 for a procedure. During today's  appointment we reviewed Amber Barnes's post-procedure results, as well as her outpatient medication regimen.  She feels like the right hip injection was effective for her hip pain.  Now she is having what she feels like is nerve pain in her buttocks.  The pain goes down the back and side of her leg.  She feels like it goes all the way down her leg.  She does have a numbness tingling and weakness.  She is currently using gabapentin 900 mg 4 times a day.  She admits that she did have a left lower extremity nerve conduction study however the right was never completed.    Further details on both, my assessment(s), as well as the proposed treatment plan, please see below.   Post-Procedure Assessment  05/31/2018 procedure: Right intra-articular hip injection \ pre-procedure pain score:  9/10 Post-procedure pain score: 0/10         Influential Factors: BMI: 53.25 kg/m Intra-procedural challenges: None observed.         Assessment challenges: None detected.              Reported side-effects: None.        Post-procedural adverse reactions or complications: None reported         Sedation: Please see nurses note. When no sedatives are used, the analgesic levels obtained are directly associated to the effectiveness of the local anesthetics. However, when sedation is provided, the level of analgesia obtained during the initial 1 hour following the intervention, is believed to be the result of a combination of factors. These factors may include, but are not limited to: 1. The effectiveness of the local anesthetics used. 2. The effects of the analgesic(s) and/or anxiolytic(s) used. 3. The degree of discomfort experienced by the patient at the time of the procedure. 4. The patients  ability and reliability in recalling and recording the events. 5. The presence and influence of possible secondary gains and/or psychosocial factors. Reported result: Relief experienced during the 1st hour after the procedure: 100 % (Ultra-Short Term Relief)            Interpretative annotation: Clinically appropriate result. Analgesia during this period is likely to be Local Anesthetic and/or IV Sedative (Analgesic/Anxiolytic) related.          Effects of local anesthetic: The analgesic effects attained during this period are directly associated to the localized infiltration of local anesthetics and therefore cary significant diagnostic value as to the etiological location, or anatomical origin, of the pain. Expected duration of relief is directly dependent on the pharmacodynamics of the local anesthetic used. Long-acting (4-6 hours) anesthetics used.  Reported result: Relief during the next 4 to 6 hour after the procedure: 100 % (Short-Term Relief)            Interpretative annotation: Clinically appropriate result. Analgesia during this period is likely to be Local Anesthetic-related.          Long-term benefit: Defined as the period of time past the expected duration of local anesthetics (1 hour for short-acting and 4-6 hours for long-acting). With the possible exception of prolonged sympathetic blockade from the local anesthetics, benefits during this period are typically attributed to, or associated with, other factors such as analgesic sensory neuropraxia, antiinflammatory effects, or beneficial biochemical changes provided by agents other than the local anesthetics.  Reported result: Extended relief following procedure: 100 %(last for one week) (Long-Term Relief)            Interpretative annotation: Clinically possible results. Good  relief. No permanent benefit expected. Inflammation plays a part in the etiology to the pain.          Current benefits: Defined as reported results that persistent  at this point in time.   Analgesia: 50 %            Function: Somewhat improved in hip ROM: Somewhat improved Interpretative annotation: Recurrence of symptoms. Limited therapeutic benefit. Results would suggest further treatment needed.          Interpretation: Results would suggest a successful diagnostic intervention. Possible adjustment in plan of care may be required          Plan:  Please see "Plan of Care" for details.                Laboratory Chemistry  Inflammation Markers (CRP: Acute Phase) (ESR: Chronic Phase) Lab Results  Component Value Date   CRP 7 02/27/2018   ESRSEDRATE 33 02/27/2018                         Rheumatology Markers No results found for: RF, ANA, LABURIC, URICUR, LYMEIGGIGMAB, LYMEABIGMQN, HLAB27                      Renal Function Markers Lab Results  Component Value Date   BUN 16 04/17/2018   CREATININE 0.76 04/17/2018   BCR 18 02/27/2018   GFRAA >60 04/17/2018   GFRNONAA >60 04/17/2018                             Hepatic Function Markers Lab Results  Component Value Date   AST 28 04/08/2018   ALT 28 04/08/2018   ALBUMIN 4.3 04/08/2018   ALKPHOS 69 04/08/2018   HCVAB NEGATIVE 05/12/2016   LIPASE 24 11/27/2016                        Electrolytes Lab Results  Component Value Date   NA 141 04/17/2018   K 4.1 04/17/2018   CL 107 04/17/2018   CALCIUM 8.6 (L) 04/17/2018   MG 2.1 02/27/2018   PHOS 4.7 (H) 08/11/2016                        Neuropathy Markers Lab Results  Component Value Date   ZOXWRUEA54 098 02/27/2018   HGBA1C 5.5 04/03/2018   HIV NONREACTIVE 05/12/2016                        CNS Tests No results found for: COLORCSF, APPEARCSF, RBCCOUNTCSF, WBCCSF, POLYSCSF, LYMPHSCSF, EOSCSF, PROTEINCSF, GLUCCSF, JCVIRUS, CSFOLI, IGGCSF                      Bone Pathology Markers Lab Results  Component Value Date   VD25OH 49.29 04/03/2018   25OHVITD1 66 02/27/2018   25OHVITD2 <1.0 02/27/2018   25OHVITD3 66 02/27/2018                          Coagulation Parameters Lab Results  Component Value Date   INR 1.05 04/30/2017   LABPROT 13.6 04/30/2017   PLT 172 04/17/2018                        Cardiovascular Markers Lab Results  Component Value Date  BNP 278.0 (H) 08/10/2016   TROPONINI <0.03 04/17/2018   HGB 14.0 04/17/2018   HCT 42.8 04/17/2018                         CA Markers No results found for: CEA, CA125, LABCA2                      Note: Lab results reviewed.  Recent Diagnostic Imaging Results  DG C-Arm 1-60 Min-No Report Fluoroscopy was utilized by the requesting physician.  No radiographic  interpretation.   Complexity Note: Imaging results reviewed. Results shared with Ms. Hinderer, using Layman's terms.                         Meds   Current Outpatient Medications:  .  ALPRAZolam (XANAX) 0.25 MG tablet, Take 1 tablet (0.25 mg total) by mouth daily as needed for anxiety., Disp: 10 tablet, Rfl: 0 .  amiodarone (PACERONE) 200 MG tablet, Take 1 tablet (200 mg total) by mouth daily., Disp: 90 tablet, Rfl: 3 .  B Complex Vitamins (VITAMIN B COMPLEX PO), Take 1 tablet by mouth daily. , Disp: , Rfl:  .  bisoprolol (ZEBETA) 5 MG tablet, Take 1 tablet (5 mg total) by mouth daily., Disp: 90 tablet, Rfl: 3 .  calcium gluconate 500 MG tablet, Take 500 tablets by mouth daily., Disp: , Rfl:  .  Cholecalciferol (VITAMIN D3) 5000 units CAPS, Take 5,000 Units by mouth daily., Disp: , Rfl:  .  ELIQUIS 5 MG TABS tablet, TAKE 1 TABLET BY MOUTH TWICE A DAY, Disp: 60 tablet, Rfl: 3 .  esomeprazole (NEXIUM) 20 MG capsule, Take 20 mg by mouth daily as needed (acid reflux)., Disp: , Rfl:  .  furosemide (LASIX) 20 MG tablet, TAKE 1 TABLET (20 MG TOTAL) BY MOUTH 2 (TWO) TIMES DAILY., Disp: 180 tablet, Rfl: 3 .  gabapentin (NEURONTIN) 300 MG capsule, Take 1-3 capsules (300-900 mg total) by mouth 4 (four) times daily., Disp: 360 capsule, Rfl: 2 .  hydrALAZINE (APRESOLINE) 25 MG tablet, Take 1 tablet (25 mg total)  by mouth 4 (four) times daily., Disp: 120 tablet, Rfl: 11 .  KLOR-CON 10 10 MEQ tablet, TAKE 1 TABLET BY MOUTH TWICE A DAY, Disp: 180 tablet, Rfl: 2 .  losartan (COZAAR) 25 MG tablet, TAKE 1 TABLET BY MOUTH EVERY DAY, Disp: 30 tablet, Rfl: 6 .  meclizine (ANTIVERT) 25 MG tablet, Take 1 tablet (25 mg total) by mouth 3 (three) times daily as needed for dizziness or nausea., Disp: 30 tablet, Rfl: 1 .  Multiple Vitamin (MULTIVITAMIN) tablet, Take 1 tablet by mouth daily., Disp: , Rfl:  .  oxymetazoline (AFRIN) 0.05 % nasal spray, Place 1 spray into both nostrils at bedtime as needed for congestion. , Disp: , Rfl:  .  ranolazine (RANEXA) 500 MG 12 hr tablet, TAKE 1 TABLET BY MOUTH TWICE A DAY, Disp: 60 tablet, Rfl: 3 .  rizatriptan (MAXALT) 10 MG tablet, TAKE 1 TABLET (10 MG TOTAL) BY MOUTH AS NEEDED FOR MIGRAINE. MAY REPEAT IN 2 HOURS IF NEEDED, Disp: 10 tablet, Rfl: 0 .  rosuvastatin (CRESTOR) 20 MG tablet, TAKE 1 TABLET BY MOUTH EVERY DAY, Disp: 90 tablet, Rfl: 0 .  sertraline (ZOLOFT) 50 MG tablet, Take 1 tablet (50 mg total) by mouth at bedtime., Disp: 90 tablet, Rfl: 3  ROS  Constitutional: Denies any fever or chills Gastrointestinal: No reported hemesis, hematochezia,  vomiting, or acute GI distress Musculoskeletal: Denies any acute onset joint swelling, redness, loss of ROM, or weakness Neurological: No reported episodes of acute onset apraxia, aphasia, dysarthria, agnosia, amnesia, paralysis, loss of coordination, or loss of consciousness  Allergies  Ms. Mcconahy is allergic to prednisone; hydrocodone-acetaminophen; oxycodone; tramadol; adhesive [tape]; and latex.  [place]  Drug: Ms. Bouchie  reports that she does not use drugs. Alcohol:  reports that she drank alcohol. Tobacco:  reports that she quit smoking about 14 years ago. Her smoking use included cigarettes. She has a 2.50 pack-year smoking history. She quit smokeless tobacco use about 16 years ago. Medical:  has a past medical history of  Achilles tendinitis, Acute medial meniscal tear, Allergy, Anxiety, Arthritis, Atrial fibrillation (HCC), CHF (congestive heart failure) (HCC), Chronic lower back pain, COPD (chronic obstructive pulmonary disease) ([place]), Depression, Fibrocystic breast disease, GERD (gastroesophageal reflux disease), Hyperlipidemia, Hypertension, Insomnia, Lipoma, Migraine, Osteoarthritis, Osteopenia, Tendonitis, Thyroid disease, and Vitamin D deficiency. Surgical: Ms. [place]field  has a past surgical history that includes Thyroidectomy; Colonoscopy; Esophagogastroduodenoscopy; Abdominal hysterectomy; Achilles tendon surgery; Back surgery; CARDIOVERSION (N/A, 09/07/2016); CARDIOVERSION (N/A, 10/19/2016); and CARDIOVERSION (N/A, 12/14/2016). Family: family history includes Breast cancer in her paternal aunt; Cancer in her mother; Cancer (age of onset: 58) in her sister; Cancer (age of onset: 56) in her father; Depression in her sister; Heart attack (age of onset: 57) in her father; Heart disease in her father; Hyperlipidemia in her father; Hypertension in her father.  Constitutional Exam  General appearance: alert, cooperative, in mild distress and morbidly obese Vitals:   06/14/18 0937  BP: (!) 156/88  Pulse: 88  Temp: 97.7 F (36.5 C)  SpO2: 98%  Weight: (!) 320 lb (145.2 kg)  Height: 5' 5"  (1.651 m)  Psych/Mental status: Alert, oriented x 3 (person, place, & time)       Eyes: PERLA Respiratory: No evidence of acute respiratory distress  Lumbar Spine Area Exam  Skin & Axial Inspection: No masses, redness, or swelling Alignment: Symmetrical Functional ROM: Unrestricted ROM       Stability: No instability detected Muscle Tone/Strength: Functionally intact. No obvious neuro-muscular anomalies detected. Sensory (Neurological): Unimpaired Palpation: Tender       Provocative Tests: Hyperextension/rotation test: deferred today       Lumbar quadrant test (Kemp's test): deferred today       Lateral bending test: deferred  today       Patrick's Maneuver: (+) for right-sided S-I arthralgia               Gait & Posture Assessment  Ambulation: Unassisted Gait: Relatively normal for age and body habitus Posture: WNL   Lower Extremity Exam    Side: Right lower extremity  Side: Left lower extremity  Stability: No instability observed          Stability: No instability observed          Skin & Extremity Inspection: Skin color, temperature, and hair growth are WNL. No peripheral edema or cyanosis. No masses, redness, swelling, asymmetry, or associated skin lesions. No contractures.  Skin & Extremity Inspection: Skin color, temperature, and hair growth are WNL. No peripheral edema or cyanosis. No masses, redness, swelling, asymmetry, or associated skin lesions. No contractures.  Functional ROM: Adequate ROM                  Functional ROM: Unrestricted ROM                  Muscle Tone/Strength: Functionally intact.  No obvious neuro-muscular anomalies detected.  Muscle Tone/Strength: Functionally intact. No obvious neuro-muscular anomalies detected.  Sensory (Neurological): Dermatomal pain pattern        Sensory (Neurological): Unimpaired            Palpation: No palpable anomalies  Palpation: No palpable anomalies   Assessment  Primary Diagnosis & Pertinent Problem List: The primary encounter diagnosis was Chronic lower extremity pain (Secondary Area of Pain) (Right). Diagnoses of Chronic sacroiliac joint pain, Weakness of right lower extremity, Osteoarthritis of hip (Right), Lumbar central spinal stenosis (L4-5), Restless leg, and Neurogenic pain were also pertinent to this visit.  Status Diagnosis  Worsening Worsening Worsening 1. Chronic lower extremity pain (Secondary Area of Pain) (Right)   2. Chronic sacroiliac joint pain   3. Weakness of right lower extremity   4. Osteoarthritis of hip (Right)   5. Lumbar central spinal stenosis (L4-5)   6. Restless leg   7. Neurogenic pain     Problems updated and  reviewed during this visit: No problems updated. Plan of Care  Pharmacotherapy (Medications Ordered): No orders of the defined types were placed in this encounter.  New Prescriptions   No medications on file   Medications administered today: [name] "[name]" had no medications administered during this visit. Lab-work, procedure(s), and/or referral(s): Orders Placed This Encounter  Procedures  . DG Si Joints  . NCV with EMG(electromyography)   Imaging and/or referral(s): None  Plan of Care  Interventional management options: Planned, scheduled, and/or pending: NOTE:ELIQUISanticoagulation due to A-fib. (Stop:3 days) (Restart:6 Hrs). NO RFA until weight is below BMI of 35 Diagnosticright IA hip injection#2+ attempt at draining cystunder fluoroscopic guidance and IV sedation (Today)   Considering: Diagnostic intra-articular right hip injection Diagnostic right sacroiliac joint injection Diagnostic right lumbar epidural steroid injection Diagnostic bilateral lumbar facet nerve block Diagnostic bilateral intra-articular knee injection Diagnostic bilateral Hyalgan series   Palliative PRN treatment(s): Palliativeright intra-articular hip joint injection(No sooner than every 6 months) (Last done 05/31/18)     Provider-requested follow-up: Return for w/ Dr. Dossie Arbour, Procedure(w/Sedation), (R) SI NB.  Future Appointments  Date Time Provider [place]  07/16/2018  2:00 PM Bu[place] Irish, [place] [facility] None  07/18/2018  2:00 PM Arnett, Yvetta Coder, FNP [facility]   Primary Care Physician: [name], FNP Location: [facility] Note by: Vevelyn Francois NP Date: 06/14/2018; Time: 12:58 PM  Pain Score Disclaimer: We use the NRS-11 scale. This is a self-reported, subjective measurement of pain severity with only modest accuracy. It is used primarily to identify changes within a particular patient. It must be  understood that outpatient pain scales are significantly less accurate that those used for research, where they can be applied under ideal controlled circumstances with minimal exposure to variables. In reality, the score is likely to be a combination of pain intensity and pain affect, where pain affect describes the degree of emotional arousal or changes in action readiness caused by the sensory experience of pain. Factors such as social and work situation, setting, emotional state, anxiety levels, expectation, and prior pain experience may influence pain perception and show large inter-individual differences that may also be affected by time variables.  Patient instructions provided during this appointment: Patient Instructions    BMI Assessment: Estimated body mass index is 53.25 kg/m as calculated from the following:   Height as of this encounter: 5' 5"  (1.651 m).   Weight as of this encounter: 320 lb (145.2 kg).  BMI interpretation table:  BMI level Category Range association with higher incidence of chronic pain  <18 kg/m2 Underweight   18.5-24.9 kg/m2 Ideal body weight   25-29.9 kg/m2 Overweight Increased incidence by 20%  30-34.9 kg/m2 Obese (Class I) Increased incidence by 68%  35-39.9 kg/m2 Severe obesity (Class II) Increased incidence by 136%  >40 kg/m2 Extreme obesity (Class III) Increased incidence by 254%   Patient's current BMI Ideal Body weight  Body mass index is 53.25 kg/m. Ideal body weight: 57 kg (125 lb 10.6 oz) Adjusted ideal body weight: 92.3 kg (203 lb 6.4 oz)   BMI Readings from Last 4 Encounters:  06/14/18 53.25 kg/m  06/01/18 53.31 kg/m  05/31/18 52.42 kg/m  05/30/18 52.42 kg/m   Wt Readings from Last 4 Encounters:  06/14/18 (!) 320 lb (145.2 kg)  06/01/18 (!) 320 lb 6 oz (145.3 kg)  05/31/18 (!) 315 lb (142.9 kg)  05/30/18 (!) 315 lb (142.9 kg)

## 2018-06-19 DIAGNOSIS — R200 Anesthesia of skin: Secondary | ICD-10-CM | POA: Insufficient documentation

## 2018-06-19 DIAGNOSIS — R29898 Other symptoms and signs involving the musculoskeletal system: Secondary | ICD-10-CM | POA: Insufficient documentation

## 2018-06-19 DIAGNOSIS — M5431 Sciatica, right side: Secondary | ICD-10-CM | POA: Insufficient documentation

## 2018-06-21 ENCOUNTER — Encounter: Payer: Self-pay | Admitting: Pain Medicine

## 2018-06-21 ENCOUNTER — Ambulatory Visit (HOSPITAL_BASED_OUTPATIENT_CLINIC_OR_DEPARTMENT_OTHER): Payer: Medicare Other | Admitting: Pain Medicine

## 2018-06-21 ENCOUNTER — Other Ambulatory Visit: Payer: Self-pay

## 2018-06-21 ENCOUNTER — Ambulatory Visit
Admission: RE | Admit: 2018-06-21 | Discharge: 2018-06-21 | Disposition: A | Discharge status: Home / Self Care | Payer: Medicare Other | Source: Ambulatory Visit | Attending: Pain Medicine | Admitting: Pain Medicine

## 2018-06-21 VITALS — BP 156/70 | HR 52 | Temp 98.2°F | Resp 15 | Ht 65.0 in | Wt 320.0 lb

## 2018-06-21 DIAGNOSIS — G8929 Other chronic pain: Secondary | ICD-10-CM | POA: Insufficient documentation

## 2018-06-21 DIAGNOSIS — M25551 Pain in right hip: Secondary | ICD-10-CM

## 2018-06-21 DIAGNOSIS — M5441 Lumbago with sciatica, right side: Secondary | ICD-10-CM

## 2018-06-21 DIAGNOSIS — M5388 Other specified dorsopathies, sacral and sacrococcygeal region: Secondary | ICD-10-CM | POA: Diagnosis present

## 2018-06-21 DIAGNOSIS — M533 Sacrococcygeal disorders, not elsewhere classified: Secondary | ICD-10-CM | POA: Insufficient documentation

## 2018-06-21 MED ORDER — METHYLPREDNISOLONE ACETATE 80 MG/ML IJ SUSP
80.0000 mg | Freq: Once | INTRAMUSCULAR | Status: AC
  Administered 2018-06-21: 80 mg via INTRA_ARTICULAR

## 2018-06-21 MED ORDER — METHYLPREDNISOLONE ACETATE 80 MG/ML IJ SUSP
INTRAMUSCULAR | Status: AC
  Filled 2018-06-21: qty 1

## 2018-06-21 MED ORDER — ROPIVACAINE HCL 2 MG/ML IJ SOLN
INTRAMUSCULAR | Status: AC
  Filled 2018-06-21: qty 10

## 2018-06-21 MED ORDER — MIDAZOLAM HCL 5 MG/5ML IJ SOLN
1.0000 mg | INTRAMUSCULAR | Status: DC | PRN
  Administered 2018-06-21: 2 mg via INTRAVENOUS

## 2018-06-21 MED ORDER — FENTANYL CITRATE (PF) 100 MCG/2ML IJ SOLN
25.0000 ug | INTRAMUSCULAR | Status: DC | PRN
  Administered 2018-06-21: 50 ug via INTRAVENOUS

## 2018-06-21 MED ORDER — ROPIVACAINE HCL 2 MG/ML IJ SOLN
9.0000 mL | Freq: Once | INTRAMUSCULAR | Status: AC
  Administered 2018-06-21: 9 mL via INTRA_ARTICULAR

## 2018-06-21 MED ORDER — MIDAZOLAM HCL 5 MG/5ML IJ SOLN
INTRAMUSCULAR | Status: AC
  Filled 2018-06-21: qty 5

## 2018-06-21 MED ORDER — FENTANYL CITRATE (PF) 100 MCG/2ML IJ SOLN
INTRAMUSCULAR | Status: AC
  Filled 2018-06-21: qty 2

## 2018-06-21 MED ORDER — LACTATED RINGERS IV SOLN
1000.0000 mL | Freq: Once | INTRAVENOUS | Status: AC
  Administered 2018-06-21: 1000 mL via INTRAVENOUS

## 2018-06-21 MED ORDER — LIDOCAINE HCL 2 % IJ SOLN
INTRAMUSCULAR | Status: AC
  Filled 2018-06-21: qty 20

## 2018-06-21 MED ORDER — LIDOCAINE HCL 2 % IJ SOLN
20.0000 mL | Freq: Once | INTRAMUSCULAR | Status: AC
  Administered 2018-06-21: 400 mg

## 2018-06-21 NOTE — Patient Instructions (Signed)

## 2018-06-21 NOTE — Progress Notes (Signed)
Patient's Name: Elysia Grand  MRN: [id number]  Referring Provider: [name], FNP  DOB: 1950/08/12  PCP: [name], FNP  DOS: 06/21/2018  Note by: Gaspar Cola, MD  Service setting: Ambulatory outpatient  Specialty: Interventional Pain Management  Patient type: Established  Location: [facility] (AMB) Pain Management Facility  Visit type: Interventional Procedure   Primary Reason for Visit: Interventional Pain Management Treatment. CC: Back Pain (low) and Hip Pain (right)  Procedure:          Anesthesia, Analgesia, Anxiolysis:  Type: Diagnostic Sacroiliac Joint Steroid Injection #1  Region: Superior Lumbosacral Region Level: PSIS (Posterior Superior Iliac Spine) Laterality: Right-Sided  Type: Moderate (Conscious) Sedation combined with Local Anesthesia Indication(s): Analgesia and Anxiety Route: Intravenous (IV) IV Access: Secured Sedation: Meaningful verbal contact was maintained at all times during the procedure  Local Anesthetic: Lidocaine 1-2%  Position: Prone           Indications: 1. Chronic right sacroiliac joint pain   2. Other specified dorsopathies, sacral and sacrococcygeal region   3. Chronic hip pain [facility] Area of Pain) (Right)   4. Chronic low back pain (Fourth Area of Pain) (Bilateral) w/ sciatica (Right)   5. Chronic hip pain, right   6. Chronic bilateral low back pain with right-sided sciatica    Pain Score: Pre-procedure: 8 /10 Post-procedure: 0-No pain/10  Pre-op Assessment:  Ms. Macquarrie is a 67 y.o. (year old), female patient, seen today for interventional treatment. She  has a past surgical history that includes Thyroidectomy; Colonoscopy; Esophagogastroduodenoscopy; Abdominal hysterectomy; Achilles tendon surgery; Back surgery; CARDIOVERSION (N/A, 09/07/2016); CARDIOVERSION (N/A, 10/19/2016); and CARDIOVERSION (N/A, 12/14/2016). Ms. Myung has a current medication list which includes the following prescription(s): alprazolam, amiodarone, b  complex vitamins, bisoprolol, calcium gluconate, vitamin d3, eliquis, esomeprazole, furosemide, gabapentin, hydralazine, klor-con 10, losartan, meclizine, multivitamin, oxymetazoline, ranolazine, rizatriptan, rosuvastatin, and sertraline, and the following Facility-Administered Medications: fentanyl and midazolam. Her primarily concern today is the Back Pain (low) and Hip Pain (right)  Initial Vital Signs:  Pulse/HCG Rate: 60ECG Heart Rate: (!) 53 Temp: (!) 97.5 F (36.4 C) Resp: 18 BP: 132/74 SpO2: 97 %  BMI: Estimated body mass index is 53.25 kg/m as calculated from the following:   Height as of this encounter: 5\' 5"  (1.651 m).   Weight as of this encounter: 320 lb (145.2 kg).  Risk Assessment: Allergies: Reviewed. She is allergic to prednisone; hydrocodone-acetaminophen; oxycodone; tramadol; adhesive [tape]; and latex.  Allergy Precautions: None required Coagulopathies: Reviewed. None identified.  Blood-thinner therapy: None at this time Active Infection(s): Reviewed. None identified. Ms. Friley is afebrile  Site Confirmation: Ms. Bralley was asked to confirm the procedure and laterality before marking the site Procedure checklist: Completed Consent: Before the procedure and under the influence of no sedative(s), amnesic(s), or anxiolytics, the patient was informed of the treatment options, risks and possible complications. To fulfill our ethical and legal obligations, as recommended by the American Medical Association's Code of Ethics, I have informed the patient of my clinical impression; the nature and purpose of the treatment or procedure; the risks, benefits, and possible complications of the intervention; the alternatives, including doing nothing; the risk(s) and benefit(s) of the alternative treatment(s) or procedure(s); and the risk(s) and benefit(s) of doing nothing. The patient was provided information about the general risks and possible complications associated with the  procedure. These may include, but are not limited to: failure to achieve desired goals, infection, bleeding, organ or nerve damage, allergic reactions, paralysis, and death. In addition,  the patient was informed of those risks and complications associated to the procedure, such as failure to decrease pain; infection; bleeding; organ or nerve damage with subsequent damage to sensory, motor, and/or autonomic systems, resulting in permanent pain, numbness, and/or weakness of one or several areas of the body; allergic reactions; (i.e.: anaphylactic reaction); and/or death. Furthermore, the patient was informed of those risks and complications associated with the medications. These include, but are not limited to: allergic reactions (i.e.: anaphylactic or anaphylactoid reaction(s)); adrenal axis suppression; blood sugar elevation that in diabetics may result in ketoacidosis or comma; water retention that in patients with history of congestive heart failure may result in shortness of breath, pulmonary edema, and decompensation with resultant heart failure; weight gain; swelling or edema; medication-induced neural toxicity; particulate matter embolism and blood vessel occlusion with resultant organ, and/or nervous system infarction; and/or aseptic necrosis of one or more joints. Finally, the patient was informed that Medicine is not an exact science; therefore, there is also the possibility of unforeseen or unpredictable risks and/or possible complications that may result in a catastrophic outcome. The patient indicated having understood very clearly. We have given the patient no guarantees and we have made no promises. Enough time was given to the patient to ask questions, all of which were answered to the patient's satisfaction. Ms. Harkless has indicated that she wanted to continue with the procedure. Attestation: I, the ordering provider, attest that I have discussed with the patient the benefits, risks, side-effects,  alternatives, likelihood of achieving goals, and potential problems during recovery for the procedure that I have provided informed consent. Date  Time: 06/21/2018  1:42 PM  Pre-Procedure Preparation:  Monitoring: As per clinic protocol. Respiration, ETCO2, SpO2, BP, heart rate and rhythm monitor placed and checked for adequate function Safety Precautions: Patient was assessed for positional comfort and pressure points before starting the procedure. Time-out: I initiated and conducted the "Time-out" before starting the procedure, as per protocol. The patient was asked to participate by confirming the accuracy of the "Time Out" information. Verification of the correct person, site, and procedure were performed and confirmed by me, the nursing staff, and the patient. "Time-out" conducted as per Joint Commission's Universal Protocol (UP.01.01.01). Time: 1510  Description of Procedure:          Target Area: Superior, posterior, aspect of the sacroiliac fissure Approach: Posterior, paraspinal, ipsilateral approach. Area Prepped: Entire Lower Lumbosacral Region Prepping solution: ChloraPrep (2% chlorhexidine gluconate and 70% isopropyl alcohol) Safety Precautions: Aspiration looking for blood return was conducted prior to all injections. At no point did we inject any substances, as a needle was being advanced. No attempts were made at seeking any paresthesias. Safe injection practices and needle disposal techniques used. Medications properly checked for expiration dates. SDV (single dose vial) medications used. Description of the Procedure: Protocol guidelines were followed. The patient was placed in position over the procedure table. The target area was identified and the area prepped in the usual manner. Skin & deeper tissues infiltrated with local anesthetic. Appropriate amount of time allowed to pass for local anesthetics to take effect. The procedure needle was advanced under fluoroscopic guidance  into the sacroiliac joint until a firm endpoint was obtained. Proper needle placement secured. Negative aspiration confirmed. Solution injected in intermittent fashion, asking for systemic symptoms every 0.5cc of injectate. The needles were then removed and the area cleansed, making sure to leave some of the prepping solution back to take advantage of its long term bactericidal properties. Vitals:  06/21/18 1520 06/21/18 1531 06/21/18 1541 06/21/18 1551  BP: 121/74 (!) 158/70 (!) 156/69 (!) 156/70  Pulse: (!) 59 60 (!) 52   Resp: 12 15 13 15   Temp:  98.2 F (36.8 C)  98.2 F (36.8 C)  SpO2: 98% 93% 95% 96%  Weight:      Height:        Start Time: 1510 hrs. End Time: 1519 hrs. Materials:  Needle(s) Type: Spinal Needle Gauge: 22G Length: 3.5-in Medication(s): Please see orders for medications and dosing details.  Imaging Guidance (Non-Spinal):          Type of Imaging Technique: Fluoroscopy Guidance (Non-Spinal) Indication(s): Assistance in needle guidance and placement for procedures requiring needle placement in or near specific anatomical locations not easily accessible without such assistance. Exposure Time: Please see nurses notes. Contrast: Before injecting any contrast, we confirmed that the patient did not have an allergy to iodine, shellfish, or radiological contrast. Once satisfactory needle placement was completed at the desired level, radiological contrast was injected. Contrast injected under live fluoroscopy. No contrast complications. See chart for type and volume of contrast used. Fluoroscopic Guidance: I was personally present during the use of fluoroscopy. "Tunnel Vision Technique" used to obtain the best possible view of the target area. Parallax error corrected before commencing the procedure. "Direction-depth-direction" technique used to introduce the needle under continuous pulsed fluoroscopy. Once target was reached, antero-posterior, oblique, and lateral fluoroscopic  projection used confirm needle placement in all planes. Images permanently stored in EMR. Interpretation: I personally interpreted the imaging intraoperatively. Adequate needle placement confirmed in multiple planes. Appropriate spread of contrast into desired area was observed. No evidence of afferent or efferent intravascular uptake. Permanent images saved into the patient's record.  Antibiotic Prophylaxis:   Anti-infectives (From admission, onward)   None     Indication(s): None identified  Post-operative Assessment:  Post-procedure Vital Signs:  Pulse/HCG Rate: (!) 52(!) 53 Temp: 98.2 F (36.8 C) Resp: 15 BP: (!) 156/70 SpO2: 96 %  EBL: None  Complications: No immediate post-treatment complications observed by team, or reported by patient.  Note: The patient tolerated the entire procedure well. A repeat set of vitals were taken after the procedure and the patient was kept under observation following institutional policy, for this type of procedure. Post-procedural neurological assessment was performed, showing return to baseline, prior to discharge. The patient was provided with post-procedure discharge instructions, including a section on how to identify potential problems. Should any problems arise concerning this procedure, the patient was given instructions to immediately contact us, at any time, without hesitation. In any case, we plan to contact the patient by telephone for a follow-up status report regarding this interventional procedure.  Comments:  No additional relevant information.  Plan of Care   Imaging Orders     DG C-Arm 1-60 Min-No Report  Procedure Orders     SACROILIAC JOINT INJECTION  Medications ordered for procedure: Meds ordered this encounter  Medications  . lidocaine (XYLOCAINE) 2 % (with pres) injection 400 mg  . midazolam (VERSED) 5 MG/5ML injection 1-2 mg    Make sure Flumazenil is available in the pyxis when using this medication. If  oversedation occurs, administer 0.2 mg IV over 15 sec. If after 45 sec no response, administer 0.2 mg again over 1 min; may repeat at 1 min intervals; not to exceed 4 doses (1 mg)  . fentaNYL (SUBLIMAZE) injection 25-50 mcg    Make sure Narcan is available in the pyxis when using this  medication. In the event of respiratory depression (RR< 8/min): Titrate NARCAN (naloxone) in increments of 0.1 to 0.2 mg IV at 2-3 minute intervals, until desired degree of reversal.  . lactated ringers infusion 1,000 mL  . methylPREDNISolone acetate (DEPO-MEDROL) injection 80 mg  . ropivacaine (PF) 2 mg/mL (0.2%) (NAROPIN) injection 9 mL   Medications administered: We administered lidocaine, midazolam, fentaNYL, lactated ringers, methylPREDNISolone acetate, and ropivacaine (PF) 2 mg/mL (0.2%).  See the medical record for exact dosing, route, and time of administration.  Disposition: Discharge home  Discharge Date & Time: 06/21/2018; 1557 hrs.   Physician-requested Follow-up: Return for post-procedure eval (2 wks), w/ Dr. Dossie Arbour.  Future Appointments  Date Time Provider [place]  07/16/2018  2:00 PM Buena Irish,  [facility] None  07/18/2018  2:00 PM [name], FNP [facility]  07/23/2018 11:15 AM Milinda Pointer, MD [facility] None   Primary Care Physician: [name], FNP Location: [facility] Note by: Gaspar Cola, MD Date: 06/21/2018; Time: 5:08 PM  Disclaimer:  Medicine is not an Chief Strategy Officer. The only guarantee in medicine is that nothing is guaranteed. It is important to note that the decision to proceed with this intervention was based on the information collected from the patient. The Data and conclusions were drawn from the patient's questionnaire, the interview, and the physical examination. Because the information was provided in large part by the patient, it cannot be guaranteed that it has not been purposely or unconsciously  manipulated. Every effort has been made to obtain as much relevant data as possible for this evaluation. It is important to note that the conclusions that lead to this procedure are derived in large part from the available data. Always take into account that the treatment will also be dependent on availability of resources and existing treatment guidelines, considered by other Pain Management Practitioners as being common knowledge and practice, at the time of the intervention. For Medico-Legal purposes, it is also important to point out that variation in procedural techniques and pharmacological choices are the acceptable norm. The indications, contraindications, technique, and results of the above procedure should only be interpreted and judged by a Board-Certified Interventional Pain Specialist with extensive familiarity and expertise in the same exact procedure and technique.

## 2018-06-21 NOTE — Progress Notes (Signed)
Safety precautions to be maintained throughout the outpatient stay will include: orient to surroundings, keep bed in low position, maintain call bell within reach at all times, provide assistance with transfer out of bed and ambulation.  

## 2018-06-22 ENCOUNTER — Telehealth: Payer: Self-pay | Admitting: *Deleted

## 2018-06-22 NOTE — Telephone Encounter (Signed)
Spoke with patient and she verbalizes no questions or concerns re; procedure on yesterday.

## 2018-06-27 ENCOUNTER — Other Ambulatory Visit: Payer: Self-pay | Admitting: Cardiovascular Disease

## 2018-07-16 ENCOUNTER — Ambulatory Visit: Payer: Medicare Other | Admitting: Psychology

## 2018-07-16 DIAGNOSIS — F418 Other specified anxiety disorders: Secondary | ICD-10-CM

## 2018-07-16 DIAGNOSIS — F322 Major depressive disorder, single episode, severe without psychotic features: Secondary | ICD-10-CM

## 2018-07-16 DIAGNOSIS — Z6841 Body Mass Index (BMI) 40.0 and over, adult: Secondary | ICD-10-CM | POA: Insufficient documentation

## 2018-07-16 HISTORY — DX: Body Mass Index (BMI) 40.0 and over, adult: Z684

## 2018-07-18 ENCOUNTER — Ambulatory Visit: Payer: Self-pay | Admitting: Family

## 2018-07-18 DIAGNOSIS — Z0289 Encounter for other administrative examinations: Secondary | ICD-10-CM

## 2018-07-20 ENCOUNTER — Other Ambulatory Visit: Payer: Self-pay | Admitting: Family

## 2018-07-20 ENCOUNTER — Other Ambulatory Visit: Payer: Self-pay | Admitting: Cardiovascular Disease

## 2018-07-22 NOTE — Progress Notes (Signed)
Patient's Name: Amber Barnes  MRN: [id number]  Referring Provider: [name], FNP  DOB: [birth year]  PCP: [name], FNP  DOS: 07/23/2018  Note by: Gaspar Cola, MD  Service setting: Ambulatory outpatient  Specialty: Interventional Pain Management  Location: [facility] (AMB) Pain Management Facility    Patient type: Established   Primary Reason(s) for Visit: Encounter for post-procedure evaluation of chronic illness with mild to moderate exacerbation CC: Hip Pain (right ) and Back Pain (lower right )  HPI  Amber Barnes is a 68 y.o. year old, female patient, who comes today for a post-procedure evaluation. She has Heel spur; Tendonitis; SOB (shortness of breath); Morbid obesity ([place]); Mixed hyperlipidemia; Essential hypertension; Anxiety and depression; Arthritis, senescent; GERD (gastroesophageal reflux disease); Seasonal rhinitis; Routine physical examination; Dizziness; Family history of ovarian cancer; Fatty liver; Hypokalemia; Chronic foot pain (Left); Atypical chest pain; Persistent atrial fibrillation; Acute pulmonary edema ([place]); Acute pain of left shoulder; OSA (obstructive sleep apnea); Restless leg; Bradycardia; Depression, recurrent ([place]); Migraine with aura and without status migrainosus, not intractable; Atherosclerosis of aorta ([place]); Olecranon bursitis of elbow (Left); Cervical radiculopathy; Screening mammogram, encounter for; Numbness and tingling; Chronic groin pain (Primary Area of Pain) (Right); Chronic lower extremity pain (Secondary Area of Pain) (Right); Chronic hip pain [facility] Area of Pain) (Right); Chronic low back pain (Fourth Area of Pain) (Bilateral) (R>L) w/ sciatica (Right); Chronic knee pain (Bilateral); Chronic pain syndrome; Pharmacologic therapy; Disorder of skeletal system; Problems influencing health status; Lumbar facet syndrome (Bilateral) (R>L); Chronic sacroiliac joint pain (Left); Chronic anticoagulation (Eliquis); Spondylosis without myelopathy  or radiculopathy, lumbosacral region; Other specified dorsopathies, sacral and sacrococcygeal region; Osteoarthritis of hip (Right); History of allergy to latex; History of lumbar laminectomy; Failed back surgical syndrome; Chronic low back pain (Fourth Area of Pain) (Bilateral) (R>L); Abnormal MRI, lumbar spine; Lumbar facet arthropathy (Bilateral) (L>R); Lumbar lateral recess stenosis (L3-4, L4-5) (Left); Lumbar foraminal stenosis (L4-5) (Left); Lumbar central spinal stenosis (L4-5); Latex precautions, history of latex allergy; Neurogenic pain; Morbid obesity with BMI of 50.0-59.9, adult ([place]); Headache; Chronic sacroiliac joint pain (Right); Leg numbness (Right); Leg weakness (Right); Back pain with right-sided sciatica; and BMI 50.0-59.9, adult (HCC) on their problem list. Her primarily concern today is the Hip Pain (right ) and Back Pain (lower right )  Pain Assessment: Location: Lower, Right Back Radiating: pain radiates around to the front groin area and goes down the front of the leg to approx the knee or just below.  Onset: More than a month ago Duration: Chronic pain Quality: Constant, Discomfort, Stabbing, Throbbing Severity: 0-No pain/10 (subjective, self-reported pain score)  Note: Reported level is compatible with observation.                         When using our objective Pain Scale, levels between 6 and 10/10 are said to belong in an emergency room, as it progressively worsens from a 6/10, described as severely limiting, requiring emergency care not usually available at an outpatient pain management facility. At a 6/10 level, communication becomes difficult and requires great effort. Assistance to reach the emergency department may be required. Facial flushing and profuse sweating along with potentially dangerous increases in heart rate and blood pressure will be evident. Effect on ADL: limits activities, sleep disruption.  Timing: Constant Modifying factors: procedures, elevation, heat  and ice.  BP: 140/71  HR: (!) 54  Amber Barnes comes in today for post-procedure evaluation.  Further details on both, my  assessment(s), as well as the proposed treatment plan, please see below.  Post-Procedure Assessment  06/21/2018 Procedure: Diagnostic right-sided sacroiliac joint block #1 under fluoroscopic guidance and IV sedation Pre-procedure pain score:  8/10 Post-procedure pain score: 0/10 (100% relief) Influential Factors: BMI: 52.92 kg/m Intra-procedural challenges: None observed.         Assessment challenges: None detected.              Reported side-effects: None.        Post-procedural adverse reactions or complications: None reported         Sedation: Sedation provided. When no sedatives are used, the analgesic levels obtained are directly associated to the effectiveness of the local anesthetics. However, when sedation is provided, the level of analgesia obtained during the initial 1 hour following the intervention, is believed to be the result of a combination of factors. These factors may include, but are not limited to: 1. The effectiveness of the local anesthetics used. 2. The effects of the analgesic(s) and/or anxiolytic(s) used. 3. The degree of discomfort experienced by the patient at the time of the procedure. 4. The patients ability and reliability in recalling and recording the events. 5. The presence and influence of possible secondary gains and/or psychosocial factors. Reported result: Relief experienced during the 1st hour after the procedure: 100 % (Ultra-Short Term Relief)            Interpretative annotation: Clinically appropriate result. Analgesia during this period is likely to be Local Anesthetic and/or IV Sedative (Analgesic/Anxiolytic) related.          Effects of local anesthetic: The analgesic effects attained during this period are directly associated to the localized infiltration of local anesthetics and therefore cary significant diagnostic value  as to the etiological location, or anatomical origin, of the pain. Expected duration of relief is directly dependent on the pharmacodynamics of the local anesthetic used. Long-acting (4-6 hours) anesthetics used.  Reported result: Relief during the next 4 to 6 hour after the procedure: 100 % (Short-Term Relief)            Interpretative annotation: Clinically appropriate result. Analgesia during this period is likely to be Local Anesthetic-related.          Long-term benefit: Defined as the period of time past the expected duration of local anesthetics (1 hour for short-acting and 4-6 hours for long-acting). With the possible exception of prolonged sympathetic blockade from the local anesthetics, benefits during this period are typically attributed to, or associated with, other factors such as analgesic sensory neuropraxia, antiinflammatory effects, or beneficial biochemical changes provided by agents other than the local anesthetics.  Reported result: Extended relief following procedure: 100 % (Long-Term Relief)            Interpretative annotation: Clinically possible results. Good relief. No permanent benefit expected. Inflammation plays a part in the etiology to the pain.          Current benefits: Defined as reported results that persistent at this point in time.   Analgesia: 50-75 % Amber Barnes reports improvement of axial symptoms. Function: Amber Barnes reports improvement in function ROM: Amber Barnes reports improvement in ROM Interpretative annotation: Ongoing benefit. Therapeutic benefit observed. Effective palliative intervention.          Interpretation: Results would suggest a successful diagnostic intervention.                  Plan:  Set up procedure as a PRN palliative treatment option for this patient.  Laboratory Chemistry  Inflammation Markers (CRP: Acute Phase) (ESR: Chronic Phase) Lab Results  Component Value Date   CRP 7 02/27/2018   ESRSEDRATE 33 02/27/2018                          Rheumatology Markers No results found.  Renal Markers Lab Results  Component Value Date   BUN 16 04/17/2018   CREATININE 0.76 04/17/2018   BCR 18 02/27/2018   GFRAA >60 04/17/2018   GFRNONAA >60 04/17/2018                             Hepatic Markers Lab Results  Component Value Date   AST 28 04/08/2018   ALT 28 04/08/2018   ALBUMIN 4.3 04/08/2018   HCVAB NEGATIVE 05/12/2016                        Neuropathy Markers Lab Results  Component Value Date   ZOXWRUEA54 098 02/27/2018   HGBA1C 5.5 04/03/2018   HIV NONREACTIVE 05/12/2016                        Hematology Parameters Lab Results  Component Value Date   INR 1.05 04/30/2017   LABPROT 13.6 04/30/2017   PLT 172 04/17/2018   HGB 14.0 04/17/2018   HCT 42.8 04/17/2018                        CV Markers Lab Results  Component Value Date   BNP 278.0 (H) 08/10/2016   TROPONINI <0.03 04/17/2018                         Note: Lab results reviewed.  Recent Imaging Results   Results for orders placed in visit on 06/21/18  DG C-Arm 1-60 Min-No Report   Narrative Fluoroscopy was utilized by the requesting physician.  No radiographic  interpretation.    Interpretation Report: Fluoroscopy was used during the procedure to assist with needle guidance. The images were interpreted intraoperatively by the requesting physician.  Meds   Current Outpatient Medications:  .  ALPRAZolam (XANAX) 0.25 MG tablet, Take 1 tablet (0.25 mg total) by mouth daily as needed for anxiety., Disp: 10 tablet, Rfl: 0 .  amiodarone (PACERONE) 200 MG tablet, Take 1 tablet (200 mg total) by mouth daily., Disp: 90 tablet, Rfl: 3 .  B Complex Vitamins (VITAMIN B COMPLEX PO), Take 1 tablet by mouth daily. , Disp: , Rfl:  .  bisoprolol (ZEBETA) 5 MG tablet, TAKE 1 TABLET BY MOUTH EVERY DAY, Disp: 90 tablet, Rfl: 1 .  calcium gluconate 500 MG tablet, Take 500 tablets by mouth daily., Disp: , Rfl:  .  Cholecalciferol (VITAMIN D3)  5000 units CAPS, Take 5,000 Units by mouth daily., Disp: , Rfl:  .  ELIQUIS 5 MG TABS tablet, TAKE 1 TABLET BY MOUTH TWICE A DAY, Disp: 60 tablet, Rfl: 3 .  esomeprazole (NEXIUM) 20 MG capsule, Take 20 mg by mouth daily as needed (acid reflux)., Disp: , Rfl:  .  furosemide (LASIX) 20 MG tablet, TAKE 1 TABLET (20 MG TOTAL) BY MOUTH 2 (TWO) TIMES DAILY., Disp: 180 tablet, Rfl: 3 .  gabapentin (NEURONTIN) 300 MG capsule, Take 1-3 capsules (300-900 mg total) by mouth 4 (four) times daily., Disp: 360 capsule, Rfl: 2 .  hydrALAZINE (  APRESOLINE) 25 MG tablet, Take 1 tablet (25 mg total) by mouth 4 (four) times daily., Disp: 120 tablet, Rfl: 11 .  KLOR-CON 10 10 MEQ tablet, TAKE 1 TABLET BY MOUTH TWICE A DAY, Disp: 180 tablet, Rfl: 2 .  losartan (COZAAR) 25 MG tablet, TAKE 1 TABLET BY MOUTH EVERY DAY, Disp: 90 tablet, Rfl: 2 .  Multiple Vitamin (MULTIVITAMIN) tablet, Take 1 tablet by mouth daily., Disp: , Rfl:  .  oxymetazoline (AFRIN) 0.05 % nasal spray, Place 1 spray into both nostrils at bedtime as needed for congestion. , Disp: , Rfl:  .  ranolazine (RANEXA) 500 MG 12 hr tablet, TAKE 1 TABLET BY MOUTH TWICE A DAY, Disp: 60 tablet, Rfl: 0 .  rizatriptan (MAXALT) 10 MG tablet, TAKE 1 TABLET (10 MG TOTAL) BY MOUTH AS NEEDED FOR MIGRAINE. MAY REPEAT IN 2 HOURS IF NEEDED, Disp: 10 tablet, Rfl: 0 .  rosuvastatin (CRESTOR) 20 MG tablet, TAKE 1 TABLET BY MOUTH EVERY DAY, Disp: 90 tablet, Rfl: 0 .  sertraline (ZOLOFT) 50 MG tablet, Take 1 tablet (50 mg total) by mouth at bedtime., Disp: 90 tablet, Rfl: 3  ROS  Constitutional: Denies any fever or chills Gastrointestinal: No reported hemesis, hematochezia, vomiting, or acute GI distress Musculoskeletal: Denies any acute onset joint swelling, redness, loss of ROM, or weakness Neurological: No reported episodes of acute onset apraxia, aphasia, dysarthria, agnosia, amnesia, paralysis, loss of coordination, or loss of consciousness  Allergies  Amber Barnes is  allergic to prednisone; hydrocodone-acetaminophen; oxycodone; tramadol; adhesive [tape]; and latex.  [place]  Drug: Amber Barnes  reports no history of drug use. Alcohol:  reports previous alcohol use. Tobacco:  reports that she quit smoking about 15 years ago. Her smoking use included cigarettes. She has a 2.50 pack-year smoking history. She quit smokeless tobacco use about 17 years ago. Medical:  has a past medical history of Achilles tendinitis, Acute medial meniscal tear, Allergy, Anxiety, Arthritis, Atrial fibrillation (HCC), CHF (congestive heart failure) (HCC), Chronic lower back pain, COPD (chronic obstructive pulmonary disease) ([place]), Depression, Fibrocystic breast disease, GERD (gastroesophageal reflux disease), Hyperlipidemia, Hypertension, Insomnia, Lipoma, Migraine, Osteoarthritis, Osteopenia, Tendonitis, Thyroid disease, and Vitamin D deficiency. Surgical: Amber Barnes  has a past surgical history that includes Thyroidectomy; Colonoscopy; Esophagogastroduodenoscopy; Abdominal hysterectomy; Achilles tendon surgery; Back surgery; CARDIOVERSION (N/A, 09/07/2016); CARDIOVERSION (N/A, 10/19/2016); and CARDIOVERSION (N/A, 12/14/2016). Family: family history includes Breast cancer in her paternal aunt; Cancer in her mother; Cancer (age of onset: 77) in her sister; Cancer (age of onset: 78) in her father; Depression in her sister; Heart attack (age of onset: 91) in her father; Heart disease in her father; Hyperlipidemia in her father; Hypertension in her father.  Constitutional Exam  General appearance: Well nourished, well developed, and well hydrated. In no apparent acute distress Vitals:   07/23/18 1053  BP: 140/71  Pulse: (!) 54  Resp: 16  Temp: 98 F (36.7 C)  TempSrc: Oral  SpO2: 96%  Weight: (!) 318 lb (144.2 kg)  Height: 5' 5" (1.651 m)   BMI Assessment: Estimated body mass index is 52.92 kg/m as calculated from the following:   Height as of this encounter: 5' 5" (1.651 m).   Weight as of  this encounter: 318 lb (144.2 kg).  BMI interpretation table: BMI level Category Range association with higher incidence of chronic pain  <18 kg/m2 Underweight   18.5-24.9 kg/m2 Ideal body weight   25-29.9 kg/m2 Overweight Increased incidence by 20%  30-34.9 kg/m2 Obese (Class I) Increased incidence by  68%  35-39.9 kg/m2 Severe obesity (Class II) Increased incidence by 136%  >40 kg/m2 Extreme obesity (Class III) Increased incidence by 254%   Patient's current BMI Ideal Body weight  Body mass index is 52.92 kg/m. Ideal body weight: 57 kg (125 lb 10.6 oz) Adjusted ideal body weight: 91.9 kg (202 lb 9.6 oz)   BMI Readings from Last 4 Encounters:  07/23/18 52.92 kg/m  06/21/18 53.25 kg/m  06/14/18 53.25 kg/m  06/01/18 53.31 kg/m   Wt Readings from Last 4 Encounters:  07/23/18 (!) 318 lb (144.2 kg)  06/21/18 (!) 320 lb (145.2 kg)  06/14/18 (!) 320 lb (145.2 kg)  06/01/18 (!) 320 lb 6 oz (145.3 kg)  Psych/Mental status: Alert, oriented x 3 (person, place, & time)       Eyes: PERLA Respiratory: No evidence of acute respiratory distress  Cervical Spine Area Exam  Skin & Axial Inspection: No masses, redness, edema, swelling, or associated skin lesions Alignment: Symmetrical Functional ROM: Unrestricted ROM      Stability: No instability detected Muscle Tone/Strength: Functionally intact. No obvious neuro-muscular anomalies detected. Sensory (Neurological): Unimpaired Palpation: No palpable anomalies              Upper Extremity (UE) Exam    Side: Right upper extremity  Side: Left upper extremity  Skin & Extremity Inspection: Skin color, temperature, and hair growth are WNL. No peripheral edema or cyanosis. No masses, redness, swelling, asymmetry, or associated skin lesions. No contractures.  Skin & Extremity Inspection: Skin color, temperature, and hair growth are WNL. No peripheral edema or cyanosis. No masses, redness, swelling, asymmetry, or associated skin lesions. No  contractures.  Functional ROM: Unrestricted ROM          Functional ROM: Unrestricted ROM          Muscle Tone/Strength: Functionally intact. No obvious neuro-muscular anomalies detected.  Muscle Tone/Strength: Functionally intact. No obvious neuro-muscular anomalies detected.  Sensory (Neurological): Unimpaired          Sensory (Neurological): Unimpaired          Palpation: No palpable anomalies              Palpation: No palpable anomalies              Provocative Test(s):  Phalen's test: deferred Tinel's test: deferred Apley's scratch test (touch opposite shoulder):  Action 1 (Across chest): deferred Action 2 (Overhead): deferred Action 3 (LB reach): deferred   Provocative Test(s):  Phalen's test: deferred Tinel's test: deferred Apley's scratch test (touch opposite shoulder):  Action 1 (Across chest): deferred Action 2 (Overhead): deferred Action 3 (LB reach): deferred    Thoracic Spine Area Exam  Skin & Axial Inspection: No masses, redness, or swelling Alignment: Symmetrical Functional ROM: Unrestricted ROM Stability: No instability detected Muscle Tone/Strength: Functionally intact. No obvious neuro-muscular anomalies detected. Sensory (Neurological): Unimpaired Muscle strength & Tone: No palpable anomalies  Lumbar Spine Area Exam  Skin & Axial Inspection: No masses, redness, or swelling Alignment: Symmetrical Functional ROM: Unrestricted ROM       Stability: No instability detected Muscle Tone/Strength: Functionally intact. No obvious neuro-muscular anomalies detected. Sensory (Neurological): Unimpaired Palpation: No palpable anomalies       Provocative Tests: Hyperextension/rotation test: deferred today       Lumbar quadrant test (Kemp's test): deferred today       Lateral bending test: deferred today       Patrick's Maneuver: deferred today  FABER* test: deferred today                   S-I anterior distraction/compression test: deferred today          S-I lateral compression test: deferred today         S-I Thigh-thrust test: deferred today         S-I Gaenslen's test: deferred today         *(Flexion, ABduction and External Rotation)  Gait & Posture Assessment  Ambulation: Patient came in today in a wheel chair Gait: Significantly limited. Dependent on assistive device to ambulate Posture: Antalgic   Lower Extremity Exam    Side: Right lower extremity  Side: Left lower extremity  Stability: No instability observed          Stability: No instability observed          Skin & Extremity Inspection: Skin color, temperature, and hair growth are WNL. No peripheral edema or cyanosis. No masses, redness, swelling, asymmetry, or associated skin lesions. No contractures.  Skin & Extremity Inspection: Skin color, temperature, and hair growth are WNL. No peripheral edema or cyanosis. No masses, redness, swelling, asymmetry, or associated skin lesions. No contractures.  Functional ROM: Impaired ROM for hip and knee joints          Functional ROM: Impaired ROM for hip and knee joints          Muscle Tone/Strength: Functionally intact. No obvious neuro-muscular anomalies detected.  Muscle Tone/Strength: Functionally intact. No obvious neuro-muscular anomalies detected.  Sensory (Neurological): Unimpaired        Sensory (Neurological): Unimpaired        DTR: Patellar: deferred today Achilles: deferred today Plantar: deferred today  DTR: Patellar: deferred today Achilles: deferred today Plantar: deferred today  Palpation: No palpable anomalies  Palpation: No palpable anomalies   Assessment  Primary Diagnosis & Pertinent Problem List: The primary encounter diagnosis was Chronic groin pain (Primary Area of Pain) (Right). Diagnoses of Chronic lower extremity pain (Secondary Area of Pain) (Right), Chronic hip pain (Tertiary Area of Pain) (Right), Chronic low back pain (Fourth Area of Pain) (Bilateral) (R>L) w/ sciatica (Right), Chronic sacroiliac  joint pain (Right), Failed back surgical syndrome, Leg numbness (Right), Leg weakness (Right), Lumbar central spinal stenosis (L4-5), Lumbar foraminal stenosis (L4-5) (Left), Lumbar lateral recess stenosis (L3-4, L4-5) (Left), Morbid obesity with BMI of 50.0-59.9, adult ([place]), and Chronic anticoagulation (Eliquis) were also pertinent to this visit.  Status Diagnosis  Controlled Persistent Persistent 1. Chronic groin pain (Primary Area of Pain) (Right)   2. Chronic lower extremity pain (Secondary Area of Pain) (Right)   3. Chronic hip pain [facility] Area of Pain) (Right)   4. Chronic low back pain (Fourth Area of Pain) (Bilateral) (R>L) w/ sciatica (Right)   5. Chronic sacroiliac joint pain (Right)   6. Failed back surgical syndrome   7. Leg numbness (Right)   8. Leg weakness (Right)   9. Lumbar central spinal stenosis (L4-5)   10. Lumbar foraminal stenosis (L4-5) (Left)   11. Lumbar lateral recess stenosis (L3-4, L4-5) (Left)   12. Morbid obesity with BMI of 50.0-59.9, adult ([place])   13. Chronic anticoagulation (Eliquis)     Problems updated and reviewed during this visit: Problem  Chronic sacroiliac joint pain (Right)  Leg numbness (Right)  Leg weakness (Right)  Back Pain With Right-Sided Sciatica  Headache  Chronic low back pain (Fourth Area of Pain) (Bilateral) (R>L) w/ sciatica (Right)  Olecranon bursitis of  elbow (Left)  Chronic foot pain (Left)  Bmi 50.0-59.9, Adult (Hcc)   Plan of Care  Pharmacotherapy (Medications Ordered): No orders of the defined types were placed in this encounter.  Medications administered today: Amber Furl "[name]" had no medications administered during this visit.  Procedure Orders    No procedure(s) ordered today   Lab Orders  No laboratory test(s) ordered today   Imaging Orders  No imaging studies ordered today   Referral Orders  No referral(s) requested today   Interventional management options: Planned, scheduled, and/or  pending:   NOTE:ELIQUISanticoagulation due to A-fib. (Stop:3 days) (Restart:6 Hrs). NO RFAuntil weight is below BMI of 35 Pending right hip replacement by Dr. Rudene Christians, on Feb 11th, 2020.   Considering:   Diagnostic intra-articular right hip injection  Diagnostic right sacroiliac joint injection  Diagnostic right lumbar epidural steroid injection  Diagnostic bilateral lumbar facet nerve block  Diagnostic bilateral intra-articular knee injection  Diagnostic bilateral Hyalgan series    Palliative PRN treatment(s):   Palliativeright intra-articular hip joint injection(No sooner than every 6 months) (Last done 05/31/18)   Provider-requested follow-up: No follow-ups on file.  Future Appointments  Date Time Provider B and E  07/25/2018 11:00 AM Arnett, Yvetta Coder, FNP [id number]   Primary Care Physician: [name], FNP Location: [facility] Outpatient Pain Management Facility Note by: Gaspar Cola, MD Date: 07/23/2018; Time: 11:55 AM

## 2018-07-23 ENCOUNTER — Encounter: Payer: Self-pay | Admitting: Pain Medicine

## 2018-07-23 ENCOUNTER — Ambulatory Visit: Payer: Medicare Other | Attending: Pain Medicine | Admitting: Pain Medicine

## 2018-07-23 VITALS — BP 140/71 | HR 54 | Temp 98.0°F | Resp 16 | Ht 65.0 in | Wt 318.0 lb

## 2018-07-23 DIAGNOSIS — Z6841 Body Mass Index (BMI) 40.0 and over, adult: Secondary | ICD-10-CM | POA: Insufficient documentation

## 2018-07-23 DIAGNOSIS — G8929 Other chronic pain: Secondary | ICD-10-CM | POA: Diagnosis present

## 2018-07-23 DIAGNOSIS — R1031 Right lower quadrant pain: Secondary | ICD-10-CM | POA: Insufficient documentation

## 2018-07-23 DIAGNOSIS — R200 Anesthesia of skin: Secondary | ICD-10-CM

## 2018-07-23 DIAGNOSIS — M25551 Pain in right hip: Secondary | ICD-10-CM

## 2018-07-23 DIAGNOSIS — M48061 Spinal stenosis, lumbar region without neurogenic claudication: Secondary | ICD-10-CM

## 2018-07-23 DIAGNOSIS — Z7901 Long term (current) use of anticoagulants: Secondary | ICD-10-CM

## 2018-07-23 DIAGNOSIS — M79604 Pain in right leg: Secondary | ICD-10-CM | POA: Diagnosis not present

## 2018-07-23 DIAGNOSIS — M5441 Lumbago with sciatica, right side: Secondary | ICD-10-CM | POA: Insufficient documentation

## 2018-07-23 DIAGNOSIS — M961 Postlaminectomy syndrome, not elsewhere classified: Secondary | ICD-10-CM | POA: Diagnosis present

## 2018-07-23 DIAGNOSIS — R29898 Other symptoms and signs involving the musculoskeletal system: Secondary | ICD-10-CM | POA: Diagnosis present

## 2018-07-23 DIAGNOSIS — M533 Sacrococcygeal disorders, not elsewhere classified: Secondary | ICD-10-CM | POA: Diagnosis present

## 2018-07-25 ENCOUNTER — Encounter: Payer: Self-pay | Admitting: Family

## 2018-07-25 ENCOUNTER — Ambulatory Visit (INDEPENDENT_AMBULATORY_CARE_PROVIDER_SITE_OTHER): Payer: Medicare Other | Admitting: Family

## 2018-07-25 VITALS — BP 130/82 | HR 48 | Temp 97.5°F | Wt 324.0 lb

## 2018-07-25 DIAGNOSIS — K760 Fatty (change of) liver, not elsewhere classified: Secondary | ICD-10-CM

## 2018-07-25 DIAGNOSIS — I4819 Other persistent atrial fibrillation: Secondary | ICD-10-CM | POA: Diagnosis not present

## 2018-07-25 DIAGNOSIS — F419 Anxiety disorder, unspecified: Secondary | ICD-10-CM

## 2018-07-25 DIAGNOSIS — F32A Depression, unspecified: Secondary | ICD-10-CM

## 2018-07-25 DIAGNOSIS — J0100 Acute maxillary sinusitis, unspecified: Secondary | ICD-10-CM | POA: Diagnosis not present

## 2018-07-25 DIAGNOSIS — F329 Major depressive disorder, single episode, unspecified: Secondary | ICD-10-CM

## 2018-07-25 DIAGNOSIS — I10 Essential (primary) hypertension: Secondary | ICD-10-CM

## 2018-07-25 LAB — COMPREHENSIVE METABOLIC PANEL
ALT: 30 U/L (ref 0–35)
AST: 27 U/L (ref 0–37)
Albumin: 3.9 g/dL (ref 3.5–5.2)
Alkaline Phosphatase: 63 U/L (ref 39–117)
BUN: 20 mg/dL (ref 6–23)
CO2: 27 mEq/L (ref 19–32)
Calcium: 9.1 mg/dL (ref 8.4–10.5)
Chloride: 104 mEq/L (ref 96–112)
Creatinine, Ser: 0.86 mg/dL (ref 0.40–1.20)
GFR: 70.25 mL/min (ref >60.00)
Glucose, Bld: 83 mg/dL (ref 70–99)
Potassium: 4.3 mEq/L (ref 3.5–5.1)
Sodium: 139 mEq/L (ref 135–145)
Total Bilirubin: 0.5 mg/dL (ref 0.2–1.2)
Total Protein: 6.8 g/dL (ref 6.0–8.3)

## 2018-07-25 LAB — HEMOGLOBIN A1C: Hgb A1c MFr Bld: 4.9 % (ref 4.6–6.5)

## 2018-07-25 MED ORDER — AMOXICILLIN-POT CLAVULANATE 875-125 MG PO TABS: 1.0000 | ORAL_TABLET | Freq: Two times a day (BID) | ORAL | 0 refills | Status: DC

## 2018-07-25 MED ORDER — SERTRALINE HCL 100 MG PO TABS: 100.0000 mg | ORAL_TABLET | Freq: Every day | ORAL | 3 refills | Status: DC

## 2018-07-25 MED ORDER — BISOPROLOL FUMARATE 5 MG PO TABS: 2.5000 mg | ORAL_TABLET | Freq: Every day | ORAL | 1 refills | Status: DC

## 2018-07-25 NOTE — Patient Instructions (Addendum)
Please decrease your esprolol to 2.5 mg once a day, monitor your heart rate.  Please call the office if your heart rate is not between 50 to 60 bpm.  Please also monitor your blood pressure and ensure that it is not elevated.  Close follow-up for this.  Please also increase  Zoloft from 50 mg to 100 mg at bedtime to see if this helps with sleep  I suspect that your infection is viral in nature.  As discussed, I advise that you wait to fill the antibiotic after 1-2 days of symptom management to see if your symptoms improve. If you do not show improvement, you may take the antibiotic as prescribed.   Mucinex, plenty of water  Let me know if not better

## 2018-07-25 NOTE — Assessment & Plan Note (Signed)
Suspect viral, possible bacterial component.  Advised patient if no improvement with Mucinex to start antibiotic.  She will let me know if not better.

## 2018-07-25 NOTE — Assessment & Plan Note (Signed)
Concern of bradycardia during today's visit.  We jointly agreed we will decrease beta-blocker.  Patient understands how to monitor heart rate at home, she states she will do so and call me if heart rate does not maintain between 50 to 60 bpm.  She will also monitor her blood pressure at this time.  Close follow-up

## 2018-07-25 NOTE — Progress Notes (Signed)
Subjective:    Patient ID: Amber Barnes, female    DOB: 10-23-1950, 68 y.o.   MRN: [id number]  CC: Amber Barnes is a 68 y.o. female who presents today for follow up.   HPI: Complains of thick nasal congestion x one week, unchanged.  No fever, cough, ear pain, sore throat.  Tried mucinex without relief.   HA- resolved after stopping. Never Returned Dr Jaynee Eagles  HTN- compliant with medication.  Had taken bisoprolol 1/2 tablet in the past due to HR. Typically runs in 15s. No syncope, dizziness, cp, sob.   GAD and depression- on 50mg  zoloft. Would like to trial 100mg  to help with sleep. Used xanax only once to help with sleep.   Pending right hip surgery 08/21/2018.   Fasting 12pm to 8pm.  Has gained about 4 pounds. Total weight  13 pounds.    Dr Dossie Arbour- Pain management Dr Rudene Christians- right hip replacement Plans to schedule colonoscopy after surgery.   HISTORY:  Past Medical History:  Diagnosis Date  . Achilles tendinitis   . Acute medial meniscal tear   . Allergy   . Anxiety   . Arthritis   . Atrial fibrillation ([place])    a. new onset 07/2016; b. CHADS2VASc --> 2 (HTN, female); c. eliquis  . CHF (congestive heart failure) ([place])   . Chronic lower back pain   . COPD (chronic obstructive pulmonary disease) ([place])   . Depression   . Fibrocystic breast disease   . GERD (gastroesophageal reflux disease)   . Hyperlipidemia   . Hypertension   . Insomnia   . Lipoma   . Migraine   . Osteoarthritis    left knee  . Osteopenia   . Tendonitis   . Thyroid disease   . Vitamin D deficiency    Past Surgical History:  Procedure Laterality Date  . ABDOMINAL HYSTERECTOMY     ovaries left  . ACHILLES TENDON SURGERY     2012,01/2017  . BACK SURGERY    . CARDIOVERSION N/A 09/07/2016   Procedure: CARDIOVERSION;  Surgeon: Minna Merritts, MD;  Location: [facility] ORS;  Service: Cardiovascular;  Laterality: N/A;  . CARDIOVERSION N/A 10/19/2016   Procedure: CARDIOVERSION;  Surgeon: Minna Merritts, MD;  Location: [facility] ORS;  Service: Cardiovascular;  Laterality: N/A;  . CARDIOVERSION N/A 12/14/2016   Procedure: CARDIOVERSION;  Surgeon: Minna Merritts, MD;  Location: [facility] ORS;  Service: Cardiovascular;  Laterality: N/A;  . COLONOSCOPY    . ESOPHAGOGASTRODUODENOSCOPY    . THYROIDECTOMY     thyroid lobectomy secondary to a benign nodule   Family History  Problem Relation Age of Onset  . Cancer Sister 58       ovarian  . Depression Sister   . Cancer Father 54       colon  . Heart disease Father   . Heart attack Father 62       died from MI  . Hyperlipidemia Father   . Hypertension Father   . Cancer Mother        leukemia  . Breast cancer Paternal Aunt        38's  . Migraines Neg Hx     Allergies: Prednisone; Hydrocodone-acetaminophen; Oxycodone; Tramadol; Adhesive [tape]; and Latex Current Outpatient Medications on File Prior to Visit  Medication Sig Dispense Refill  . ALPRAZolam (XANAX) 0.25 MG tablet Take 1 tablet (0.25 mg total) by mouth daily as needed for anxiety. 10 tablet 0  . amiodarone (PACERONE) 200 MG tablet Take 1  tablet (200 mg total) by mouth daily. 90 tablet 3  . B Complex Vitamins (VITAMIN B COMPLEX PO) Take 1 tablet by mouth daily.     . calcium gluconate 500 MG tablet Take 500 tablets by mouth daily.    . Cholecalciferol (VITAMIN D3) 5000 units CAPS Take 5,000 Units by mouth daily.    Marland Kitchen ELIQUIS 5 MG TABS tablet TAKE 1 TABLET BY MOUTH TWICE A DAY 60 tablet 3  . esomeprazole (NEXIUM) 20 MG capsule Take 20 mg by mouth daily as needed (acid reflux).    . furosemide (LASIX) 20 MG tablet TAKE 1 TABLET (20 MG TOTAL) BY MOUTH 2 (TWO) TIMES DAILY. 180 tablet 3  . gabapentin (NEURONTIN) 300 MG capsule Take 1-3 capsules (300-900 mg total) by mouth 4 (four) times daily. 360 capsule 2  . hydrALAZINE (APRESOLINE) 25 MG tablet Take 1 tablet (25 mg total) by mouth 4 (four) times daily. 120 tablet 11  . KLOR-CON 10 10 MEQ tablet TAKE 1 TABLET BY MOUTH TWICE A DAY  180 tablet 2  . losartan (COZAAR) 25 MG tablet TAKE 1 TABLET BY MOUTH EVERY DAY 90 tablet 2  . Multiple Vitamin (MULTIVITAMIN) tablet Take 1 tablet by mouth daily.    Marland Kitchen oxymetazoline (AFRIN) 0.05 % nasal spray Place 1 spray into both nostrils at bedtime as needed for congestion.     . ranolazine (RANEXA) 500 MG 12 hr tablet TAKE 1 TABLET BY MOUTH TWICE A DAY 60 tablet 0  . rizatriptan (MAXALT) 10 MG tablet TAKE 1 TABLET (10 MG TOTAL) BY MOUTH AS NEEDED FOR MIGRAINE. MAY REPEAT IN 2 HOURS IF NEEDED 10 tablet 0  . rosuvastatin (CRESTOR) 20 MG tablet TAKE 1 TABLET BY MOUTH EVERY DAY 90 tablet 0   No current facility-administered medications on file prior to visit.     Social History   Tobacco Use  . Smoking status: Former Smoker    Packs/day: 0.25    Years: 10.00    Pack years: 2.50    Types: Cigarettes    Last attempt to quit: 2005    Years since quitting: 15.0  . Smokeless tobacco: Former Systems developer    Quit date: 07/11/2001  . Tobacco comment: smoked for 10 years, 1 pack every 2-3 days  Substance Use Topics  . Alcohol use: Not Currently    Comment: social, occasional  . Drug use: No    Review of Systems  Constitutional: Negative for chills and fever.  HENT: Positive for congestion and sinus pressure. Negative for ear pain.   Respiratory: Negative for cough, shortness of breath and wheezing.   Cardiovascular: Negative for chest pain and palpitations.  Gastrointestinal: Negative for nausea and vomiting.  Musculoskeletal: Positive for arthralgias and back pain (chronic low back, hip).  Neurological: Negative for dizziness and headaches.  Psychiatric/Behavioral: Positive for sleep disturbance. Negative for suicidal ideas. The patient is nervous/anxious.       Objective:    BP 130/82 (BP Location: Right Wrist, Patient Position: Sitting, Cuff Size: Normal)   Pulse (!) 48   Temp (!) 97.5 F (36.4 C)   Wt (!) 324 lb (147 kg)   SpO2 97%   BMI 53.92 kg/m  BP Readings from Last 3  Encounters:  07/25/18 130/82  07/23/18 140/71  06/21/18 (!) 156/70   Wt Readings from Last 3 Encounters:  07/25/18 (!) 324 lb (147 kg)  07/23/18 (!) 318 lb (144.2 kg)  06/21/18 (!) 320 lb (145.2 kg)    Physical Exam Vitals  signs reviewed.  Constitutional:      Appearance: She is well-developed.  HENT:     Head: Normocephalic and atraumatic.     Right Ear: Hearing, tympanic membrane, ear canal and external ear normal. No decreased hearing noted. No drainage, swelling or tenderness. No middle ear effusion. No foreign body. Tympanic membrane is not erythematous or bulging.     Left Ear: Hearing, tympanic membrane, ear canal and external ear normal. No decreased hearing noted. No drainage, swelling or tenderness.  No middle ear effusion. No foreign body. Tympanic membrane is not erythematous or bulging.     Nose: No rhinorrhea.     Right Sinus: Maxillary sinus tenderness (slight) present. No frontal sinus tenderness.     Left Sinus: Maxillary sinus tenderness (slight) present. No frontal sinus tenderness.     Mouth/Throat:     Pharynx: Uvula midline. No oropharyngeal exudate or posterior oropharyngeal erythema.     Tonsils: No tonsillar abscesses.  Eyes:     Conjunctiva/sclera: Conjunctivae normal.  Cardiovascular:     Rate and Rhythm: Regular rhythm. Bradycardia present.     Pulses: Normal pulses.     Heart sounds: Normal heart sounds.  Pulmonary:     Effort: Pulmonary effort is normal.     Breath sounds: Normal breath sounds. No wheezing, rhonchi or rales.  Lymphadenopathy:     Head:     Right side of head: No submental, submandibular, tonsillar, preauricular, posterior auricular or occipital adenopathy.     Left side of head: No submental, submandibular, tonsillar, preauricular, posterior auricular or occipital adenopathy.     Cervical: No cervical adenopathy.  Skin:    General: Skin is warm and dry.  Neurological:     Mental Status: She is alert.  Psychiatric:         Speech: Speech normal.        Behavior: Behavior normal.        Thought Content: Thought content normal.        Assessment & Plan:   Problem List Items Addressed This Visit      Cardiovascular and Mediastinum   Essential hypertension    Marginally elevated at today's visit however more concerned with bradycardia today.  We will continue to follow.        Relevant Medications   bisoprolol (ZEBETA) 5 MG tablet   Persistent atrial fibrillation    Concern of bradycardia during today's visit.  We jointly agreed we will decrease beta-blocker.  Patient understands how to monitor heart rate at home, she states she will do so and call me if heart rate does not maintain between 50 to 60 bpm.  She will also monitor her blood pressure at this time.  Close follow-up      Relevant Medications   bisoprolol (ZEBETA) 5 MG tablet   Other Relevant Orders   Comprehensive metabolic panel     Respiratory   Acute non-recurrent maxillary sinusitis    Suspect viral, possible bacterial component.  Advised patient if no improvement with Mucinex to start antibiotic.  She will let me know if not better.      Relevant Medications   amoxicillin-clavulanate (AUGMENTIN) 875-125 MG tablet     Digestive   Fatty liver   Relevant Orders   Hemoglobin A1c     Other   Anxiety and depression - Primary    Trial increasing Zoloft to help with breakthrough anxiety, sleep.  Close follow-up      Relevant Medications   sertraline (ZOLOFT) 100  MG tablet       I have discontinued [name] "Jackie"'s sertraline. I have also changed her bisoprolol. Additionally, I am having her start on sertraline and amoxicillin-clavulanate. Lastly, I am having her maintain her multivitamin, B Complex Vitamins (VITAMIN B COMPLEX PO), oxymetazoline, Vitamin D3, esomeprazole, calcium gluconate, amiodarone, furosemide, rizatriptan, ELIQUIS, hydrALAZINE, gabapentin, KLOR-CON 10, rosuvastatin, ALPRAZolam, ranolazine, and  losartan.   Meds ordered this encounter  Medications  . sertraline (ZOLOFT) 100 MG tablet    Sig: Take 1 tablet (100 mg total) by mouth daily.    Dispense:  30 tablet    Refill:  3    Order Specific Question:   Supervising Provider    Answer:   Deborra Medina L [[id number]]  . bisoprolol (ZEBETA) 5 MG tablet    Sig: Take 0.5 tablets (2.5 mg total) by mouth daily.    Dispense:  90 tablet    Refill:  1    Order Specific Question:   Supervising Provider    Answer:   Deborra Medina L [[id number]]  . amoxicillin-clavulanate (AUGMENTIN) 875-125 MG tablet    Sig: Take 1 tablet by mouth 2 (two) times daily.    Dispense:  14 tablet    Refill:  0    Order Specific Question:   Supervising Provider    Answer:   Crecencio Mc [[id number]]    Return precautions given.   Risks, benefits, and alternatives of the medications and treatment plan prescribed today were discussed, and patient expressed understanding.   Education regarding symptom management and diagnosis given to patient on AVS.  Continue to follow with [name], FNP for routine health maintenance.   [name] and I agreed with plan.   [name], FNP

## 2018-07-25 NOTE — Assessment & Plan Note (Signed)
Marginally elevated at today's visit however more concerned with bradycardia today.  We will continue to follow.

## 2018-07-25 NOTE — Assessment & Plan Note (Signed)
Trial increasing Zoloft to help with breakthrough anxiety, sleep.  Close follow-up

## 2018-07-27 ENCOUNTER — Telehealth: Payer: Self-pay | Admitting: Family

## 2018-07-27 NOTE — Telephone Encounter (Signed)
Copied from [place] [id number]. Topic: Quick Communication - Lab Results (Clinic Use ONLY) >> Jul 27, 2018  2:47 PM Dorna Leitz, CMA wrote: Called patient to inform them of 07/27/2018 lab results. When patient returns call, triage nurse may disclose results. >> Jul 27, 2018  4:24 PM Sheran Luz wrote: Patient is calling back for lab results. Please advise.

## 2018-07-27 NOTE — Telephone Encounter (Signed)
Charted in result notes and routed to office with answers to questions of [name] NP.

## 2018-08-01 ENCOUNTER — Telehealth: Payer: Self-pay | Admitting: Cardiovascular Disease

## 2018-08-01 NOTE — Telephone Encounter (Signed)
Called and patient denies any new cardiac symptoms such as chest pain or shortness of breath. Routing Dr [name] and pharmacy for clearance.

## 2018-08-01 NOTE — Telephone Encounter (Signed)
° °  [facility] Pre-operative Risk Assessment    Request for surgical clearance:  1. What type of surgery is being performed? Right total hip arthroplasty    2. When is this surgery scheduled? 08/21/18   3. What type of clearance is required (medical clearance vs. Pharmacy clearance to hold med vs. Both)? both  4. Are there any medications that need to be held prior to surgery and how long? Would like instructions on how to take blood thinners before and after surgery   5. Practice name and name of physician performing surgery? [facility], Amber Barnes   6. What is your office phone number [phone]    7.   What is your office fax number [phone]  8.   Anesthesia type (None, local, MAC, general) ? None listed   Amber Barnes 08/01/2018, 3:54 PM  _________________________________________________________________   (provider comments below)

## 2018-08-01 NOTE — Telephone Encounter (Signed)
Acceptable risk for procedure No further cardiac testing needed Would hold Eliquis 2 days prior to procedure We will defer restart to surgeon Typically would wait at least 24 hours before restarting

## 2018-08-02 NOTE — Telephone Encounter (Signed)
Routed to number provided via EPIC fax.  

## 2018-08-03 ENCOUNTER — Other Ambulatory Visit: Payer: Self-pay | Admitting: Cardiovascular Disease

## 2018-08-06 ENCOUNTER — Other Ambulatory Visit: Payer: Self-pay | Admitting: Pain Medicine

## 2018-08-06 DIAGNOSIS — G2581 Restless legs syndrome: Secondary | ICD-10-CM

## 2018-08-06 DIAGNOSIS — M792 Neuralgia and neuritis, unspecified: Secondary | ICD-10-CM

## 2018-08-08 ENCOUNTER — Telehealth: Payer: Self-pay | Admitting: *Deleted

## 2018-08-08 ENCOUNTER — Encounter
Admission: RE | Admit: 2018-08-08 | Discharge: 2018-08-08 | Disposition: A | Discharge status: Home / Self Care | Payer: Medicare Other | Source: Ambulatory Visit | Attending: Orthopedic Surgery | Admitting: Orthopedic Surgery

## 2018-08-08 ENCOUNTER — Telehealth: Payer: Self-pay | Admitting: Pain Medicine

## 2018-08-08 ENCOUNTER — Other Ambulatory Visit: Payer: Self-pay

## 2018-08-08 DIAGNOSIS — Z01812 Encounter for preprocedural laboratory examination: Secondary | ICD-10-CM | POA: Diagnosis present

## 2018-08-08 HISTORY — DX: Sleep apnea, unspecified: G47.30

## 2018-08-08 LAB — BASIC METABOLIC PANEL
Anion gap: 8 (ref 5–15)
BUN: 23 mg/dL (ref 8–23)
CO2: 27 mmol/L (ref 22–32)
Calcium: 8.8 mg/dL — ABNORMAL LOW (ref 8.9–10.3)
Chloride: 105 mmol/L (ref 98–111)
Creatinine, Ser: 0.96 mg/dL (ref 0.44–1.00)
GFR calc Af Amer: >60 mL/min (ref >60)
GFR calc non Af Amer: >60 mL/min (ref >60)
Glucose, Bld: 87 mg/dL (ref 70–99)
Potassium: 4 mmol/L (ref 3.5–5.1)
Sodium: 140 mmol/L (ref 135–145)

## 2018-08-08 LAB — CBC
HCT: 41 % (ref 36.0–46.0)
Hemoglobin: 13.1 g/dL (ref 12.0–15.0)
MCH: 30.5 pg (ref 26.0–34.0)
MCHC: 32 g/dL (ref 30.0–36.0)
MCV: 95.6 fL (ref 80.0–100.0)
Platelets: 208 10*3/uL (ref 150–400)
RBC: 4.29 MIL/uL (ref 3.87–5.11)
RDW: 13.9 % (ref 11.5–15.5)
WBC: 6.6 10*3/uL (ref 4.0–10.5)
nRBC: 0 % (ref 0.0–0.2)

## 2018-08-08 LAB — URINALYSIS, ROUTINE W REFLEX MICROSCOPIC
Bilirubin Urine: NEGATIVE
Glucose, UA: NEGATIVE mg/dL
Hgb urine dipstick: NEGATIVE
Ketones, ur: NEGATIVE mg/dL
Leukocytes, UA: NEGATIVE
Nitrite: NEGATIVE
Protein, ur: NEGATIVE mg/dL
Specific Gravity, Urine: 1.015 (ref 1.005–1.030)
pH: 5 (ref 5.0–8.0)

## 2018-08-08 LAB — TYPE AND SCREEN
ABO/RH(D): A POS
Antibody Screen: NEGATIVE

## 2018-08-08 LAB — SURGICAL PCR SCREEN
MRSA, PCR: NEGATIVE
Staphylococcus aureus: NEGATIVE

## 2018-08-08 LAB — PROTIME-INR
INR: 1.1
Prothrombin Time: 14.1 seconds (ref 11.4–15.2)

## 2018-08-08 LAB — SEDIMENTATION RATE: Sed Rate: 16 mm/hr (ref 0–30)

## 2018-08-08 LAB — APTT: aPTT: 32 seconds (ref 24–36)

## 2018-08-08 NOTE — Patient Instructions (Signed)
Your procedure is scheduled on: August 21, 2018 TUESDAY Report to Day Surgery on the 2nd floor of the [facility]. To find out your arrival time, please call [phone] between 1PM - 3PM on: Monday February 10 , 2020   REMEMBER: Instructions that are not followed completely may result in serious medical risk, up to and including death; or upon the discretion of your surgeon and anesthesiologist your surgery may need to be rescheduled.  Do not eat food after midnight the night before surgery.  No gum chewing, lozengers or hard candies.  You may however, drink CLEAR liquids up to 2 hours before you are scheduled to arrive for your surgery. Do not drink anything within 2 hours of the start of your surgery.  Clear liquids include: - water  - apple juice without pulp - CLEAR gatorade - black coffee or tea (Do NOT add milk or creamers to the coffee or tea) Do NOT drink anything that is not on this list.   No Alcohol for 24 hours before or after surgery.  No Smoking including e-cigarettes for 24 hours prior to surgery.  No chewable tobacco products for at least 6 hours prior to surgery.  No nicotine patches on the day of surgery.  On the morning of surgery brush your teeth with toothpaste and water, you may rinse your mouth with mouthwash if you wish. Do not swallow any toothpaste or mouthwash.  Notify your doctor if there is any change in your medical condition (cold, fever, infection).  Do not wear jewelry, make-up, hairpins, clips or nail polish.  Do not wear lotions, powders, or perfumes.   Do not shave 48 hours prior to surgery.   Contacts and dentures may not be worn into surgery.  Do not bring valuables to the hospital, including drivers license, insurance or credit cards.   is not responsible for any belongings or valuables.   TAKE THESE MEDICATIONS THE MORNING OF SURGERY: Donalee Citrin (take one the night before and one on the morning of surgery - helps  to prevent nausea after surgery.) GABAPENTIN HYDRALAZINE ROSUVASTATIN RANEXA  Use CHG Soap as directed on instruction sheet.   Follow recommendations from Cardiologist, Pulmonologist or PCP regarding stopping , Eliquis. STOP 2 DAYS BEFORE SURGERY  Stop Anti-inflammatories (NSAIDS) such as Advil, Aleve, Ibuprofen, Motrin, Naproxen, Naprosyn and Aspirin based products such as Excedrin, Goodys Powder, BC Powder. (May take Tylenol or Acetaminophen if needed.)  Stop ANY OVER THE COUNTER supplements until after surgery. (May continue Vitamin D, Vitamin B, and multivitamin, KLOR CON.)  Wear comfortable clothing (specific to your surgery type) to the hospital.  Plan for stool softeners for home use.  If you are being admitted to the hospital overnight, leave your suitcase in the car. After surgery it may be brought to your room.  If you are being discharged the day of surgery, you will not be allowed to drive home. You will need a responsible adult to drive you home and stay with you that night.   If you are taking public transportation, you will need to have a responsible adult with you. Please confirm with your physician that it is acceptable to use public transportation.   Please call [phone] if you have any questions about these instructions.

## 2018-08-08 NOTE — Telephone Encounter (Signed)
Sending PA via covermymeds for Gabapentin 08/08/18

## 2018-08-08 NOTE — Telephone Encounter (Signed)
Amber Barnes from [facility] [phone] called and stated that they have sent over PA's for gabapentin and haven't heard anything back.

## 2018-08-08 NOTE — Telephone Encounter (Signed)
Called patient to let her know that Gabapentin does not require a PA per her insurance.  She request that I call her pharmacy and let them know.   Called to pharmacy.  The original Rx was written 05/21/18 and there are currently no refills.  Patient was last seen on 07/23/18 by Dr Dossie Arbour for f/up from procedure.  No Rx's given at that time.

## 2018-08-09 ENCOUNTER — Other Ambulatory Visit: Payer: Self-pay | Admitting: Nurse Practitioner

## 2018-08-09 DIAGNOSIS — M792 Neuralgia and neuritis, unspecified: Secondary | ICD-10-CM

## 2018-08-09 DIAGNOSIS — G2581 Restless legs syndrome: Secondary | ICD-10-CM

## 2018-08-09 LAB — URINE CULTURE

## 2018-08-09 MED ORDER — GABAPENTIN 300 MG PO CAPS: 300.0000 mg | ORAL_CAPSULE | Freq: Four times a day (QID) | ORAL | 2 refills | Status: DC

## 2018-08-16 ENCOUNTER — Other Ambulatory Visit: Payer: Self-pay | Admitting: Family

## 2018-08-16 DIAGNOSIS — F419 Anxiety disorder, unspecified: Secondary | ICD-10-CM

## 2018-08-16 DIAGNOSIS — F32A Depression, unspecified: Secondary | ICD-10-CM

## 2018-08-16 DIAGNOSIS — F329 Major depressive disorder, single episode, unspecified: Secondary | ICD-10-CM

## 2018-08-20 ENCOUNTER — Other Ambulatory Visit: Payer: Self-pay | Admitting: Cardiovascular Disease

## 2018-08-20 MED ORDER — DEXTROSE 5 % IV SOLN
3.0000 g | Freq: Once | INTRAVENOUS | Status: AC
  Administered 2018-08-21: 3 g via INTRAVENOUS
  Filled 2018-08-20: qty 3

## 2018-08-20 NOTE — Telephone Encounter (Signed)
Refill Request.  

## 2018-08-21 ENCOUNTER — Inpatient Hospital Stay: Payer: Medicare Other

## 2018-08-21 ENCOUNTER — Inpatient Hospital Stay: Payer: Medicare Other | Admitting: Certified Registered"

## 2018-08-21 ENCOUNTER — Inpatient Hospital Stay
Admission: AD | Admit: 2018-08-21 | Discharge: 2018-08-25 | DRG: 470 | Disposition: A | Discharge status: Skilled Nursing Facility | Payer: Medicare Other | Attending: Orthopedic Surgery | Admitting: Orthopedic Surgery

## 2018-08-21 ENCOUNTER — Other Ambulatory Visit: Payer: Self-pay

## 2018-08-21 ENCOUNTER — Encounter
Admission: AD | Disposition: A | Discharge status: Skilled Nursing Facility | Payer: Self-pay | Source: Home / Self Care | Attending: Orthopedic Surgery

## 2018-08-21 DIAGNOSIS — I110 Hypertensive heart disease with heart failure: Secondary | ICD-10-CM | POA: Diagnosis present

## 2018-08-21 DIAGNOSIS — Z79899 Other long term (current) drug therapy: Secondary | ICD-10-CM

## 2018-08-21 DIAGNOSIS — Z8249 Family history of ischemic heart disease and other diseases of the circulatory system: Secondary | ICD-10-CM

## 2018-08-21 DIAGNOSIS — Z888 Allergy status to other drugs, medicaments and biological substances status: Secondary | ICD-10-CM | POA: Diagnosis not present

## 2018-08-21 DIAGNOSIS — Z91040 Latex allergy status: Secondary | ICD-10-CM | POA: Diagnosis not present

## 2018-08-21 DIAGNOSIS — F329 Major depressive disorder, single episode, unspecified: Secondary | ICD-10-CM | POA: Diagnosis present

## 2018-08-21 DIAGNOSIS — Z885 Allergy status to narcotic agent status: Secondary | ICD-10-CM

## 2018-08-21 DIAGNOSIS — R0902 Hypoxemia: Secondary | ICD-10-CM | POA: Diagnosis not present

## 2018-08-21 DIAGNOSIS — Z800 Family history of malignant neoplasm of digestive organs: Secondary | ICD-10-CM | POA: Diagnosis not present

## 2018-08-21 DIAGNOSIS — Z806 Family history of leukemia: Secondary | ICD-10-CM | POA: Diagnosis not present

## 2018-08-21 DIAGNOSIS — Z419 Encounter for procedure for purposes other than remedying health state, unspecified: Secondary | ICD-10-CM

## 2018-08-21 DIAGNOSIS — J449 Chronic obstructive pulmonary disease, unspecified: Secondary | ICD-10-CM | POA: Diagnosis present

## 2018-08-21 DIAGNOSIS — G8929 Other chronic pain: Secondary | ICD-10-CM | POA: Diagnosis present

## 2018-08-21 DIAGNOSIS — I482 Chronic atrial fibrillation, unspecified: Secondary | ICD-10-CM | POA: Diagnosis present

## 2018-08-21 DIAGNOSIS — Z90710 Acquired absence of both cervix and uterus: Secondary | ICD-10-CM

## 2018-08-21 DIAGNOSIS — M1611 Unilateral primary osteoarthritis, right hip: Secondary | ICD-10-CM | POA: Diagnosis present

## 2018-08-21 DIAGNOSIS — Z96641 Presence of right artificial hip joint: Secondary | ICD-10-CM

## 2018-08-21 DIAGNOSIS — E785 Hyperlipidemia, unspecified: Secondary | ICD-10-CM | POA: Diagnosis present

## 2018-08-21 DIAGNOSIS — Z7901 Long term (current) use of anticoagulants: Secondary | ICD-10-CM

## 2018-08-21 DIAGNOSIS — I10 Essential (primary) hypertension: Secondary | ICD-10-CM | POA: Diagnosis present

## 2018-08-21 DIAGNOSIS — E662 Morbid (severe) obesity with alveolar hypoventilation: Secondary | ICD-10-CM | POA: Diagnosis present

## 2018-08-21 DIAGNOSIS — K219 Gastro-esophageal reflux disease without esophagitis: Secondary | ICD-10-CM | POA: Diagnosis present

## 2018-08-21 DIAGNOSIS — R4182 Altered mental status, unspecified: Secondary | ICD-10-CM | POA: Diagnosis not present

## 2018-08-21 DIAGNOSIS — Z6841 Body Mass Index (BMI) 40.0 and over, adult: Secondary | ICD-10-CM

## 2018-08-21 DIAGNOSIS — G8918 Other acute postprocedural pain: Secondary | ICD-10-CM

## 2018-08-21 DIAGNOSIS — I5032 Chronic diastolic (congestive) heart failure: Secondary | ICD-10-CM | POA: Diagnosis present

## 2018-08-21 DIAGNOSIS — Z833 Family history of diabetes mellitus: Secondary | ICD-10-CM

## 2018-08-21 DIAGNOSIS — Z87891 Personal history of nicotine dependence: Secondary | ICD-10-CM

## 2018-08-21 DIAGNOSIS — Z9119 Patient's noncompliance with other medical treatment and regimen: Secondary | ICD-10-CM

## 2018-08-21 HISTORY — PX: TOTAL HIP ARTHROPLASTY: SHX124

## 2018-08-21 LAB — ABO/RH: ABO/RH(D): A POS

## 2018-08-21 SURGERY — ARTHROPLASTY, HIP, TOTAL, ANTERIOR APPROACH
Anesthesia: General | Site: Hip | Laterality: Right

## 2018-08-21 MED ORDER — PHENOL 1.4 % MT LIQD: 1.0000 | OROMUCOSAL | Status: DC | PRN

## 2018-08-21 MED ORDER — ONDANSETRON HCL 4 MG/2ML IJ SOLN
INTRAMUSCULAR | Status: AC
  Filled 2018-08-21: qty 2

## 2018-08-21 MED ORDER — ALUM & MAG HYDROXIDE-SIMETH 200-200-20 MG/5ML PO SUSP
30.0000 mL | ORAL | Status: DC | PRN
  Administered 2018-08-21: 30 mL via ORAL
  Filled 2018-08-21: qty 30

## 2018-08-21 MED ORDER — ONDANSETRON HCL 4 MG PO TABS: 4.0000 mg | ORAL_TABLET | Freq: Four times a day (QID) | ORAL | Status: DC | PRN

## 2018-08-21 MED ORDER — METOCLOPRAMIDE HCL 10 MG PO TABS: 5.0000 mg | ORAL_TABLET | Freq: Three times a day (TID) | ORAL | Status: DC | PRN

## 2018-08-21 MED ORDER — BUPIVACAINE HCL (PF) 0.25 % IJ SOLN
INTRAMUSCULAR | Status: AC
  Filled 2018-08-21: qty 30

## 2018-08-21 MED ORDER — ZOLPIDEM TARTRATE 5 MG PO TABS: 5.0000 mg | ORAL_TABLET | Freq: Every evening | ORAL | Status: DC | PRN

## 2018-08-21 MED ORDER — ONDANSETRON HCL 4 MG/2ML IJ SOLN
INTRAMUSCULAR | Status: DC | PRN
  Administered 2018-08-21: 4 mg via INTRAVENOUS

## 2018-08-21 MED ORDER — TRANEXAMIC ACID-NACL 1000-0.7 MG/100ML-% IV SOLN
1000.0000 mg | INTRAVENOUS | Status: AC
  Administered 2018-08-21: 1000 mg via INTRAVENOUS
  Filled 2018-08-21: qty 100

## 2018-08-21 MED ORDER — FENTANYL CITRATE (PF) 100 MCG/2ML IJ SOLN
INTRAMUSCULAR | Status: DC | PRN
  Administered 2018-08-21: 100 ug via INTRAVENOUS

## 2018-08-21 MED ORDER — ACETAMINOPHEN 10 MG/ML IV SOLN
INTRAVENOUS | Status: AC
  Filled 2018-08-21: qty 100

## 2018-08-21 MED ORDER — GLYCOPYRROLATE 0.2 MG/ML IJ SOLN
INTRAMUSCULAR | Status: DC | PRN
  Administered 2018-08-21 (×2): 0.2 mg via INTRAVENOUS

## 2018-08-21 MED ORDER — HEPARIN SODIUM (PORCINE) 10000 UNIT/ML IJ SOLN
INTRAMUSCULAR | Status: AC
  Filled 2018-08-21: qty 1

## 2018-08-21 MED ORDER — RANOLAZINE ER 500 MG PO TB12
500.0000 mg | ORAL_TABLET | Freq: Two times a day (BID) | ORAL | Status: DC
  Administered 2018-08-21 – 2018-08-25 (×7): 500 mg via ORAL
  Filled 2018-08-21 (×9): qty 1

## 2018-08-21 MED ORDER — LOSARTAN POTASSIUM 25 MG PO TABS
25.0000 mg | ORAL_TABLET | Freq: Every day | ORAL | Status: DC
  Administered 2018-08-22: 25 mg via ORAL
  Filled 2018-08-21 (×2): qty 1

## 2018-08-21 MED ORDER — BISACODYL 5 MG PO TBEC
5.0000 mg | DELAYED_RELEASE_TABLET | Freq: Every day | ORAL | Status: DC | PRN
  Administered 2018-08-25: 5 mg via ORAL
  Filled 2018-08-21: qty 1

## 2018-08-21 MED ORDER — VASOPRESSIN 20 UNIT/ML IV SOLN
INTRAVENOUS | Status: DC | PRN
  Administered 2018-08-21 (×3): .5 [IU] via INTRAVENOUS

## 2018-08-21 MED ORDER — ROCURONIUM BROMIDE 50 MG/5ML IV SOLN
INTRAVENOUS | Status: AC
  Filled 2018-08-21: qty 1

## 2018-08-21 MED ORDER — MAGNESIUM CITRATE PO SOLN
1.0000 | Freq: Once | ORAL | Status: DC | PRN
  Filled 2018-08-21: qty 296

## 2018-08-21 MED ORDER — SERTRALINE HCL 50 MG PO TABS
100.0000 mg | ORAL_TABLET | Freq: Every day | ORAL | Status: DC
  Administered 2018-08-22 – 2018-08-25 (×4): 100 mg via ORAL
  Filled 2018-08-21 (×4): qty 2

## 2018-08-21 MED ORDER — GENTAMICIN SULFATE 40 MG/ML IJ SOLN
INTRAMUSCULAR | Status: AC
  Filled 2018-08-21: qty 2

## 2018-08-21 MED ORDER — HYDROMORPHONE HCL 1 MG/ML IJ SOLN
0.5000 mg | INTRAMUSCULAR | Status: DC | PRN
  Administered 2018-08-21 – 2018-08-22 (×3): 1 mg via INTRAVENOUS
  Filled 2018-08-21 (×3): qty 1

## 2018-08-21 MED ORDER — VITAMIN D 25 MCG (1000 UNIT) PO TABS
5000.0000 [IU] | ORAL_TABLET | Freq: Every day | ORAL | Status: DC
  Administered 2018-08-22 – 2018-08-25 (×4): 5000 [IU] via ORAL
  Filled 2018-08-21 (×4): qty 5

## 2018-08-21 MED ORDER — DIPHENHYDRAMINE HCL 12.5 MG/5ML PO ELIX: 12.5000 mg | ORAL_SOLUTION | ORAL | Status: DC | PRN

## 2018-08-21 MED ORDER — DEXAMETHASONE SODIUM PHOSPHATE 10 MG/ML IJ SOLN
INTRAMUSCULAR | Status: AC
  Filled 2018-08-21: qty 1

## 2018-08-21 MED ORDER — OXYCODONE HCL 5 MG PO TABS
10.0000 mg | ORAL_TABLET | ORAL | Status: DC | PRN
  Administered 2018-08-21 – 2018-08-22 (×4): 15 mg via ORAL
  Filled 2018-08-21 (×4): qty 3

## 2018-08-21 MED ORDER — LACTATED RINGERS IV SOLN
INTRAVENOUS | Status: DC
  Administered 2018-08-21: 09:00:00 via INTRAVENOUS

## 2018-08-21 MED ORDER — EPINEPHRINE PF 1 MG/ML IJ SOLN
INTRAMUSCULAR | Status: AC
  Filled 2018-08-21: qty 1

## 2018-08-21 MED ORDER — FENTANYL CITRATE (PF) 100 MCG/2ML IJ SOLN
INTRAMUSCULAR | Status: AC
  Filled 2018-08-21: qty 2

## 2018-08-21 MED ORDER — CALCIUM GLUCONATE 500 MG PO TABS
1.0000 | ORAL_TABLET | Freq: Every day | ORAL | Status: DC
  Filled 2018-08-21 (×2): qty 1

## 2018-08-21 MED ORDER — APIXABAN 5 MG PO TABS
5.0000 mg | ORAL_TABLET | Freq: Two times a day (BID) | ORAL | Status: DC
  Administered 2018-08-21 – 2018-08-25 (×8): 5 mg via ORAL
  Filled 2018-08-21 (×8): qty 1

## 2018-08-21 MED ORDER — ACETAMINOPHEN 325 MG PO TABS
325.0000 mg | ORAL_TABLET | Freq: Four times a day (QID) | ORAL | Status: DC | PRN
  Administered 2018-08-23 – 2018-08-25 (×4): 650 mg via ORAL
  Filled 2018-08-21 (×5): qty 2

## 2018-08-21 MED ORDER — SODIUM CHLORIDE 0.9 % IV SOLN
INTRAVENOUS | Status: DC | PRN
  Administered 2018-08-21: 1000 mL

## 2018-08-21 MED ORDER — HYDRALAZINE HCL 25 MG PO TABS
25.0000 mg | ORAL_TABLET | Freq: Four times a day (QID) | ORAL | Status: DC
  Administered 2018-08-22: 25 mg via ORAL
  Filled 2018-08-21 (×5): qty 1

## 2018-08-21 MED ORDER — METHOCARBAMOL 1000 MG/10ML IJ SOLN
500.0000 mg | Freq: Four times a day (QID) | INTRAVENOUS | Status: DC | PRN
  Filled 2018-08-21: qty 5

## 2018-08-21 MED ORDER — ONDANSETRON HCL 4 MG/2ML IJ SOLN: 4.0000 mg | Freq: Once | INTRAMUSCULAR | Status: DC | PRN

## 2018-08-21 MED ORDER — MIDAZOLAM HCL 2 MG/2ML IJ SOLN: INTRAMUSCULAR | Status: DC | PRN

## 2018-08-21 MED ORDER — PANTOPRAZOLE SODIUM 40 MG PO TBEC
40.0000 mg | DELAYED_RELEASE_TABLET | Freq: Every day | ORAL | Status: DC
  Administered 2018-08-22 – 2018-08-25 (×4): 40 mg via ORAL
  Filled 2018-08-21 (×4): qty 1

## 2018-08-21 MED ORDER — ALBUTEROL SULFATE HFA 108 (90 BASE) MCG/ACT IN AERS
INHALATION_SPRAY | RESPIRATORY_TRACT | Status: AC
  Filled 2018-08-21: qty 6.7

## 2018-08-21 MED ORDER — ALBUMIN HUMAN 5 % IV SOLN
INTRAVENOUS | Status: AC
  Filled 2018-08-21: qty 250

## 2018-08-21 MED ORDER — LIDOCAINE HCL (PF) 2 % IJ SOLN
INTRAMUSCULAR | Status: AC
  Filled 2018-08-21: qty 10

## 2018-08-21 MED ORDER — METOCLOPRAMIDE HCL 5 MG/ML IJ SOLN: 5.0000 mg | Freq: Three times a day (TID) | INTRAMUSCULAR | Status: DC | PRN

## 2018-08-21 MED ORDER — GLYCOPYRROLATE 0.2 MG/ML IJ SOLN
INTRAMUSCULAR | Status: AC
  Filled 2018-08-21: qty 1

## 2018-08-21 MED ORDER — AMIODARONE HCL 200 MG PO TABS
200.0000 mg | ORAL_TABLET | Freq: Every day | ORAL | Status: DC
  Administered 2018-08-22 – 2018-08-25 (×3): 200 mg via ORAL
  Filled 2018-08-21 (×3): qty 1

## 2018-08-21 MED ORDER — ALPRAZOLAM 0.25 MG PO TABS
0.2500 mg | ORAL_TABLET | Freq: Every day | ORAL | Status: DC | PRN
  Administered 2018-08-21: 0.25 mg via ORAL
  Filled 2018-08-21: qty 1

## 2018-08-21 MED ORDER — GLYCOPYRROLATE 0.2 MG/ML IJ SOLN: INTRAMUSCULAR | Status: DC | PRN

## 2018-08-21 MED ORDER — ADULT MULTIVITAMIN W/MINERALS CH
1.0000 | ORAL_TABLET | Freq: Every day | ORAL | Status: DC
  Administered 2018-08-22 – 2018-08-25 (×4): 1 via ORAL
  Filled 2018-08-21 (×4): qty 1

## 2018-08-21 MED ORDER — BISOPROLOL FUMARATE 5 MG PO TABS
2.5000 mg | ORAL_TABLET | Freq: Every day | ORAL | Status: DC
  Administered 2018-08-22: 2.5 mg via ORAL
  Filled 2018-08-21 (×5): qty 0.5

## 2018-08-21 MED ORDER — MIDAZOLAM HCL 2 MG/2ML IJ SOLN
INTRAMUSCULAR | Status: AC
  Filled 2018-08-21: qty 2

## 2018-08-21 MED ORDER — PROPOFOL 500 MG/50ML IV EMUL
INTRAVENOUS | Status: AC
  Filled 2018-08-21: qty 50

## 2018-08-21 MED ORDER — PROPOFOL 10 MG/ML IV BOLUS
INTRAVENOUS | Status: DC | PRN
  Administered 2018-08-21: 150 mg via INTRAVENOUS

## 2018-08-21 MED ORDER — MENTHOL 3 MG MT LOZG: 1.0000 | LOZENGE | OROMUCOSAL | Status: DC | PRN

## 2018-08-21 MED ORDER — METHOCARBAMOL 500 MG PO TABS
500.0000 mg | ORAL_TABLET | Freq: Four times a day (QID) | ORAL | Status: DC | PRN
  Administered 2018-08-21: 500 mg via ORAL
  Filled 2018-08-21: qty 1

## 2018-08-21 MED ORDER — GABAPENTIN 300 MG PO CAPS
300.0000 mg | ORAL_CAPSULE | Freq: Four times a day (QID) | ORAL | Status: DC
  Administered 2018-08-21 – 2018-08-23 (×10): 300 mg via ORAL
  Filled 2018-08-21 (×10): qty 1

## 2018-08-21 MED ORDER — ROSUVASTATIN CALCIUM 10 MG PO TABS
20.0000 mg | ORAL_TABLET | Freq: Every day | ORAL | Status: DC
  Administered 2018-08-22 – 2018-08-25 (×4): 20 mg via ORAL
  Filled 2018-08-21 (×4): qty 2

## 2018-08-21 MED ORDER — BUPIVACAINE-EPINEPHRINE 0.25% -1:200000 IJ SOLN
INTRAMUSCULAR | Status: DC | PRN
  Administered 2018-08-21: 30 mL

## 2018-08-21 MED ORDER — LIDOCAINE HCL (CARDIAC) PF 100 MG/5ML IV SOSY
PREFILLED_SYRINGE | INTRAVENOUS | Status: DC | PRN
  Administered 2018-08-21: 100 mg via INTRAVENOUS

## 2018-08-21 MED ORDER — SODIUM CHLORIDE 0.9 % IV SOLN
INTRAVENOUS | Status: DC
  Administered 2018-08-21 – 2018-08-22 (×2): via INTRAVENOUS

## 2018-08-21 MED ORDER — DOCUSATE SODIUM 100 MG PO CAPS
100.0000 mg | ORAL_CAPSULE | Freq: Two times a day (BID) | ORAL | Status: DC
  Administered 2018-08-21 – 2018-08-24 (×7): 100 mg via ORAL
  Filled 2018-08-21 (×7): qty 1

## 2018-08-21 MED ORDER — ALBUMIN HUMAN 5 % IV SOLN
INTRAVENOUS | Status: DC | PRN
  Administered 2018-08-21: 11:00:00 via INTRAVENOUS

## 2018-08-21 MED ORDER — DEXTROSE 5 % IV SOLN
3.0000 g | Freq: Four times a day (QID) | INTRAVENOUS | Status: AC
  Administered 2018-08-21 – 2018-08-22 (×3): 3 g via INTRAVENOUS
  Filled 2018-08-21 (×3): qty 3

## 2018-08-21 MED ORDER — SUGAMMADEX SODIUM 200 MG/2ML IV SOLN
INTRAVENOUS | Status: DC | PRN
  Administered 2018-08-21: 300 mg via INTRAVENOUS

## 2018-08-21 MED ORDER — SUGAMMADEX SODIUM 500 MG/5ML IV SOLN
INTRAVENOUS | Status: AC
  Filled 2018-08-21: qty 5

## 2018-08-21 MED ORDER — OXYMETAZOLINE HCL 0.05 % NA SOLN: 1.0000 | Freq: Every evening | NASAL | Status: DC | PRN

## 2018-08-21 MED ORDER — B COMPLEX-C PO TABS
1.0000 | ORAL_TABLET | Freq: Every day | ORAL | Status: DC
  Administered 2018-08-22 – 2018-08-25 (×4): 1 via ORAL
  Filled 2018-08-21 (×5): qty 1

## 2018-08-21 MED ORDER — ONDANSETRON HCL 4 MG/2ML IJ SOLN: 4.0000 mg | Freq: Four times a day (QID) | INTRAMUSCULAR | Status: DC | PRN

## 2018-08-21 MED ORDER — EPHEDRINE SULFATE 50 MG/ML IJ SOLN
INTRAMUSCULAR | Status: DC | PRN
  Administered 2018-08-21: 5 mg via INTRAVENOUS
  Administered 2018-08-21 (×3): 10 mg via INTRAVENOUS
  Administered 2018-08-21: 5 mg via INTRAVENOUS
  Administered 2018-08-21: 10 mg via INTRAVENOUS
  Administered 2018-08-21: 5 mg via INTRAVENOUS

## 2018-08-21 MED ORDER — FENTANYL CITRATE (PF) 100 MCG/2ML IJ SOLN
25.0000 ug | INTRAMUSCULAR | Status: DC | PRN
  Administered 2018-08-21: 25 ug via INTRAVENOUS

## 2018-08-21 MED ORDER — ACETAMINOPHEN 10 MG/ML IV SOLN
INTRAVENOUS | Status: DC | PRN
  Administered 2018-08-21: 1000 mg via INTRAVENOUS

## 2018-08-21 MED ORDER — OXYCODONE HCL 5 MG PO TABS
5.0000 mg | ORAL_TABLET | ORAL | Status: DC | PRN
  Administered 2018-08-22: 5 mg via ORAL
  Filled 2018-08-21: qty 1

## 2018-08-21 MED ORDER — FUROSEMIDE 20 MG PO TABS
20.0000 mg | ORAL_TABLET | Freq: Every day | ORAL | Status: DC
  Administered 2018-08-22 – 2018-08-23 (×2): 20 mg via ORAL
  Filled 2018-08-21 (×2): qty 1

## 2018-08-21 MED ORDER — FENTANYL CITRATE (PF) 100 MCG/2ML IJ SOLN
25.0000 ug | INTRAMUSCULAR | Status: DC | PRN
  Administered 2018-08-21 (×3): 25 ug via INTRAVENOUS

## 2018-08-21 MED ORDER — PHENYLEPHRINE HCL 10 MG/ML IJ SOLN
INTRAMUSCULAR | Status: DC | PRN
  Administered 2018-08-21 (×4): 100 ug via INTRAVENOUS

## 2018-08-21 MED ORDER — ACETAMINOPHEN 500 MG PO TABS
1000.0000 mg | ORAL_TABLET | Freq: Four times a day (QID) | ORAL | Status: AC
  Administered 2018-08-21 – 2018-08-22 (×4): 1000 mg via ORAL
  Filled 2018-08-21 (×4): qty 2

## 2018-08-21 MED ORDER — POTASSIUM CHLORIDE CRYS ER 10 MEQ PO TBCR
10.0000 meq | EXTENDED_RELEASE_TABLET | Freq: Every day | ORAL | Status: DC
  Administered 2018-08-22 – 2018-08-25 (×4): 10 meq via ORAL
  Filled 2018-08-21 (×4): qty 1

## 2018-08-21 MED ORDER — SUCCINYLCHOLINE CHLORIDE 20 MG/ML IJ SOLN
INTRAMUSCULAR | Status: DC | PRN
  Administered 2018-08-21: 100 mg via INTRAVENOUS

## 2018-08-21 MED ORDER — MIDAZOLAM HCL 2 MG/2ML IJ SOLN
INTRAMUSCULAR | Status: DC | PRN
  Administered 2018-08-21: 2 mg via INTRAVENOUS

## 2018-08-21 MED ORDER — DEXAMETHASONE SODIUM PHOSPHATE 10 MG/ML IJ SOLN
INTRAMUSCULAR | Status: DC | PRN
  Administered 2018-08-21: 5 mg via INTRAVENOUS

## 2018-08-21 MED ORDER — SUCCINYLCHOLINE CHLORIDE 20 MG/ML IJ SOLN
INTRAMUSCULAR | Status: AC
  Filled 2018-08-21: qty 1

## 2018-08-21 MED ORDER — ROCURONIUM BROMIDE 100 MG/10ML IV SOLN
INTRAVENOUS | Status: DC | PRN
  Administered 2018-08-21: 45 mg via INTRAVENOUS
  Administered 2018-08-21: 10 mg via INTRAVENOUS
  Administered 2018-08-21: 5 mg via INTRAVENOUS

## 2018-08-21 MED ORDER — MAGNESIUM HYDROXIDE 400 MG/5ML PO SUSP
30.0000 mL | Freq: Every day | ORAL | Status: DC | PRN
  Administered 2018-08-23: 30 mL via ORAL
  Filled 2018-08-21: qty 30

## 2018-08-21 MED ORDER — KETAMINE HCL 50 MG/ML IJ SOLN
INTRAMUSCULAR | Status: DC | PRN
  Administered 2018-08-21: 25 mg via INTRAVENOUS
  Administered 2018-08-21: 25 mg via INTRAMUSCULAR

## 2018-08-21 MED ORDER — SUMATRIPTAN SUCCINATE 50 MG PO TABS
100.0000 mg | ORAL_TABLET | ORAL | Status: DC | PRN
  Filled 2018-08-21: qty 2

## 2018-08-21 SURGICAL SUPPLY — 66 items
BLADE SAGITTAL AGGR TOOTH XLG (BLADE) ×2 IMPLANT
BLADE SAW 90X13X1.19 OSCILLAT (BLADE) ×2 IMPLANT
BNDG COHESIVE 6X5 TAN STRL LF (GAUZE/BANDAGES/DRESSINGS) ×6 IMPLANT
CANISTER SUCT 1200ML W/VALVE (MISCELLANEOUS) ×2 IMPLANT
CELL SAVER AUTOCOAGULATE (MISCELLANEOUS) ×2
CELL SAVER COLL SVCS (MISCELLANEOUS) ×2
CELL SAVER FILTER LIPID PALL S (MISCELLANEOUS) ×2
CHLORAPREP W/TINT 26ML (MISCELLANEOUS) ×2 IMPLANT
COVER WAND RF STERILE (DRAPES) ×2 IMPLANT
DRAPE C-ARM XRAY 36X54 (DRAPES) ×2 IMPLANT
DRAPE INCISE IOBAN 66X60 STRL (DRAPES) IMPLANT
DRAPE POUCH INSTRU U-SHP 10X18 (DRAPES) ×2 IMPLANT
DRAPE SHEET LG 3/4 BI-LAMINATE (DRAPES) ×6 IMPLANT
DRAPE TABLE BACK 80X90 (DRAPES) ×2 IMPLANT
DRESSING SURGICEL FIBRLLR 1X2 (HEMOSTASIS) ×2 IMPLANT
DRSG OPSITE POSTOP 4X8 (GAUZE/BANDAGES/DRESSINGS) ×4 IMPLANT
DRSG SURGICEL FIBRILLAR 1X2 (HEMOSTASIS) ×4
ELECT BLADE 6.5 EXT (BLADE) ×2 IMPLANT
ELECT REM PT RETURN 9FT ADLT (ELECTROSURGICAL) ×2
ELECTRODE REM PT RTRN 9FT ADLT (ELECTROSURGICAL) ×1 IMPLANT
FILTER LIPID PALL S CELL SAVER (MISCELLANEOUS) ×1 IMPLANT
GLOVE BIOGEL PI IND STRL 9 (GLOVE) ×1 IMPLANT
GLOVE BIOGEL PI INDICATOR 9 (GLOVE) ×1
GLOVE SURG SYN 9.0  PF PI (GLOVE) ×2
GLOVE SURG SYN 9.0 PF PI (GLOVE) ×2 IMPLANT
GOWN SRG 2XL LVL 4 RGLN SLV (GOWNS) ×1 IMPLANT
GOWN STRL NON-REIN 2XL LVL4 (GOWNS) ×1
GOWN STRL REUS W/ TWL LRG LVL3 (GOWN DISPOSABLE) ×1 IMPLANT
GOWN STRL REUS W/TWL LRG LVL3 (GOWN DISPOSABLE) ×1
HEMOVAC 400CC 10FR (MISCELLANEOUS) IMPLANT
HIP DBL LINER 54X28 (Liner) ×2 IMPLANT
HIP FEM HD S 28 (Head) ×2 IMPLANT
HOLDER FOLEY CATH W/STRAP (MISCELLANEOUS) ×2 IMPLANT
HOOD PEEL AWAY FLYTE STAYCOOL (MISCELLANEOUS) ×2 IMPLANT
KIT PREVENA INCISION MGT 13 (CANNISTER) ×2 IMPLANT
MAT ABSORB  FLUID 56X50 GRAY (MISCELLANEOUS) ×1
MAT ABSORB FLUID 56X50 GRAY (MISCELLANEOUS) ×1 IMPLANT
NDL SAFETY ECLIPSE 18X1.5 (NEEDLE) ×1 IMPLANT
NEEDLE HYPO 18GX1.5 SHARP (NEEDLE) ×1
NEEDLE SPNL 18GX3.5 QUINCKE PK (NEEDLE) ×2 IMPLANT
NS IRRIG 1000ML POUR BTL (IV SOLUTION) ×2 IMPLANT
PACK HIP COMPR (MISCELLANEOUS) ×2 IMPLANT
PENCIL SMOKE ULTRAEVAC 22 CON (MISCELLANEOUS) ×2 IMPLANT
SAVE CELL AUTOCOAG (MISCELLANEOUS) ×1 IMPLANT
SAVER CELL COLL SVCS (MISCELLANEOUS) ×1 IMPLANT
SCALPEL PROTECTED #10 DISP (BLADE) ×4 IMPLANT
SHELL ACETABULAR SZ 54 DM (Shell) ×2 IMPLANT
SOL PREP PVP 2OZ (MISCELLANEOUS) ×2
SOLUTION PREP PVP 2OZ (MISCELLANEOUS) ×1 IMPLANT
SPONGE DRAIN TRACH 4X4 STRL 2S (GAUZE/BANDAGES/DRESSINGS) ×2 IMPLANT
STAPLER SKIN PROX 35W (STAPLE) ×2 IMPLANT
STEM FEMORAL 1 STD COLLARED (Stem) ×2 IMPLANT
STRAP SAFETY 5IN WIDE (MISCELLANEOUS) ×2 IMPLANT
SUT DVC 2 QUILL PDO  T11 36X36 (SUTURE) ×1
SUT DVC 2 QUILL PDO T11 36X36 (SUTURE) ×1 IMPLANT
SUT SILK 0 (SUTURE) ×1
SUT SILK 0 30XBRD TIE 6 (SUTURE) ×1 IMPLANT
SUT V-LOC 90 ABS DVC 3-0 CL (SUTURE) ×2 IMPLANT
SUT VIC AB 1 CT1 36 (SUTURE) ×2 IMPLANT
SYR 20CC LL (SYRINGE) ×2 IMPLANT
SYR 30ML LL (SYRINGE) ×2 IMPLANT
SYR BULB IRRIG 60ML STRL (SYRINGE) ×2 IMPLANT
TAPE MICROFOAM 4IN (TAPE) ×2 IMPLANT
TOWEL OR 17X26 4PK STRL BLUE (TOWEL DISPOSABLE) ×2 IMPLANT
TRAY FOLEY MTR SLVR 16FR STAT (SET/KITS/TRAYS/PACK) ×2 IMPLANT
WND VAC CANISTER 500ML (MISCELLANEOUS) ×2 IMPLANT

## 2018-08-21 NOTE — Progress Notes (Signed)
PT Cancellation Note  Patient Details Name: Amber Barnes MRN: [id number] DOB: 03/30/1951   Cancelled Treatment:    Reason Eval/Treat Not Completed: (Consult received and chart reviewed.  Patient sleeping soundly upon arrival, but awakens to voice.  Agreeable to attempt session, but unable to maintain alertness (despite constant cuing) for adequate participation at this time.  Will continue efforts as appropriate.)   [name], PT, DPT, NCS 08/21/18, 3:41 PM [phone]

## 2018-08-21 NOTE — Anesthesia Post-op Follow-up Note (Signed)
Anesthesia QCDR form completed.        

## 2018-08-21 NOTE — Care Management (Signed)
Patient is sound asleep, will assess when awake, left HH list per CMS at bedside for choice

## 2018-08-21 NOTE — Transfer of Care (Signed)
Immediate Anesthesia Transfer of Care Note  Patient: Amber Barnes  Procedure(s) Performed: TOTAL HIP ARTHROPLASTY ANTERIOR APPROACH-RIGHT CELL SAVER REQUESTED (Right Hip)  Patient Location: PACU  Anesthesia Type:General  Level of Consciousness: awake  Airway & Oxygen Therapy: Patient Spontanous Breathing and Patient connected to face mask oxygen  Post-op Assessment: Report given to RN and Post -op Vital signs reviewed and stable  Post vital signs: Reviewed  Last Vitals:  Vitals Value Taken Time  BP 106/63 08/21/2018 12:19 PM  Temp    Pulse 63 08/21/2018 12:19 PM  Resp 14 08/21/2018 12:19 PM  SpO2 99 % 08/21/2018 12:19 PM  Vitals shown include unvalidated device data.  Last Pain:  Vitals:   08/21/18 0833  TempSrc: Temporal  PainSc: 10-Worst pain ever         Complications: No apparent anesthesia complications

## 2018-08-21 NOTE — Op Note (Signed)
08/21/2018  12:06 PM  PATIENT:  Amber Barnes  68 y.o. female  PRE-OPERATIVE DIAGNOSIS:  PRIMARY OSTEOARTHRITIS RIGHT HIP  POST-OPERATIVE DIAGNOSIS:  PRIMARY OSTEOARTHRITIS RIGHT HIP  PROCEDURE:  Procedure(s): TOTAL HIP ARTHROPLASTY ANTERIOR APPROACH-RIGHT CELL SAVER REQUESTED (Right)  SURGEON: Laurene Footman, MD  ASSISTANTS: None  ANESTHESIA:   general  EBL:  Total I/O In: 1300 [I.V.:700; Blood:350; IV Piggyback:250] Out: 700 [Urine:150; Blood:550]  BLOOD ADMINISTERED:none  DRAINS: (2) Hemovact drain(s) in the Subcutaneous layer with  Suction Open   LOCAL MEDICATIONS USED:  MARCAINE     SPECIMEN:  Source of Specimen:  Right femoral head  DISPOSITION OF SPECIMEN:  PATHOLOGY  COUNTS:  YES  TOURNIQUET:  * No tourniquets in log *  IMPLANTS: Medacta AMIS 1 stem standard, S metal 28 mm head, 54 mm Mpact DM cup and liner  DICTATION: .Dragon Dictation   The patient was brought to the operating room and after general anesthesia was obtained patient was placed on the operative table with the ipsilateral foot into the Medacta attachment, contralateral leg on a well-padded table. C-arm was brought in and preop template x-ray taken. After prepping and draping in usual sterile fashion appropriate patient identification and timeout procedures were completed. Anterior approach to the hip was obtained and centered over the greater trochanter and TFL muscle. The subcutaneous tissue was incised hemostasis being achieved by electrocautery. TFL fascia was incised and the muscle retracted laterally deep retractor placed. The lateral femoral circumflex vessels were identified and ligated. The anterior capsule was exposed and a capsulotomy performed. The neck was identified and a femoral neck cut carried out with a saw. The head was removed without difficulty and showed sclerotic femoral head and acetabulum. Reaming was carried out to 52 mm and a 54 mm cup trial gave appropriate tightness to the  acetabular component a 54 cup was impacted into position. The leg was then externally rotated and ischiofemoral and pubofemoral releases carried out. The femur was sequentially broached to a size 1, size 1 standard with S head trials were placed and the final components chosen. The one standard stem was inserted along with a metal S 28 mm head and 54 mm liner. The hip was reduced and was stable the wound was thoroughly irrigated with fibrillar placed along the posterior capsule and medial neck. The deep fascia ws closed using a heavy Quill after infiltration of 30 cc of quarter percent Sensorcaine with epinephrin.  Subcutaneous drains were placed.  Marland Kitchen3-0 V-loc to close the skin with skin staples.  Incisional wound VAC applied and patient was sent to recovery in stable condition.   PLAN OF CARE: Admit to inpatient

## 2018-08-21 NOTE — NC FL2 (Signed)
[place] LEVEL OF CARE SCREENING TOOL     IDENTIFICATION  Patient Name: Amber Barnes Birthdate: [birth year] Sex: female Admission Date (Current Location): 08/21/2018  [place] and [place] Number:  Engineering geologist and Address:  [facility], [address] [place], [place] [address]      Provider Number: [id number]  Attending Physician Name and Address:  [name], MD  Relative Name and Phone Number:       Current Level of Care: Hospital Recommended Level of Care: [place] Prior Approval Number:    Date Approved/Denied:   PASRR Number: [id number])  Discharge Plan: SNF    Current Diagnoses: Patient Active Problem List   Diagnosis Date Noted  . Status post total hip replacement, right 08/21/2018  . Acute non-recurrent maxillary sinusitis 07/25/2018  . BMI 50.0-59.9, adult ([place]) 07/16/2018  . Chronic sacroiliac joint pain (Right) 06/21/2018  . Leg numbness (Right) 06/19/2018  . Leg weakness (Right) 06/19/2018  . Back pain with right-sided sciatica 06/19/2018  . Headache 06/06/2018  . Morbid obesity with BMI of 50.0-59.9, adult ([place]) 05/31/2018  . Neurogenic pain 05/21/2018  . Chronic low back pain (Fourth Area of Pain) (Bilateral) (R>L) 05/03/2018  . Abnormal MRI, lumbar spine 05/03/2018  . Lumbar facet arthropathy (Bilateral) (L>R) 05/03/2018  . Lumbar lateral recess stenosis (L3-4, L4-5) (Left) 05/03/2018  . Lumbar foraminal stenosis (L4-5) (Left) 05/03/2018  . Lumbar central spinal stenosis (L4-5) 05/03/2018  . Latex precautions, history of latex allergy 05/03/2018  . History of lumbar laminectomy 04/25/2018  . Failed back surgical syndrome 04/25/2018  . Osteoarthritis of hip (Right) 04/05/2018  . History of allergy to latex 04/05/2018  . Spondylosis without myelopathy or radiculopathy, lumbosacral region 03/27/2018  . Other specified dorsopathies, sacral and sacrococcygeal region 03/27/2018  .  Lumbar facet syndrome (Bilateral) (R>L) 03/21/2018  . Chronic sacroiliac joint pain (Left) 03/21/2018  . Chronic anticoagulation (Eliquis) 03/21/2018  . Chronic groin pain (Primary Area of Pain) (Right) 02/27/2018  . Chronic lower extremity pain (Secondary Area of Pain) (Right) 02/27/2018  . Chronic hip pain [facility] Area of Pain) (Right) 02/27/2018  . Chronic low back pain (Fourth Area of Pain) (Bilateral) (R>L) w/ sciatica (Right) 02/27/2018  . Chronic knee pain (Bilateral) 02/27/2018  . Chronic pain syndrome 02/27/2018  . Pharmacologic therapy 02/27/2018  . Disorder of skeletal system 02/27/2018  . Problems influencing health status 02/27/2018  . Screening mammogram, encounter for 02/02/2018  . Numbness and tingling 12/08/2017  . Olecranon bursitis of elbow (Left) 11/14/2017  . Cervical radiculopathy 11/14/2017  . Atherosclerosis of aorta ([place]) 08/25/2017  . Migraine with aura and without status migrainosus, not intractable 06/27/2017  . Bradycardia 05/22/2017  . Restless leg 04/13/2017  . OSA (obstructive sleep apnea) 01/09/2017  . Acute pain of left shoulder 11/28/2016  . Persistent atrial fibrillation 08/11/2016  . Acute pulmonary edema (HCC)   . Hypokalemia 07/28/2016  . Chronic foot pain (Left) 07/28/2016  . Atypical chest pain   . Fatty liver 06/30/2016  . Dizziness 06/08/2016  . Family history of ovarian cancer 06/08/2016  . GERD (gastroesophageal reflux disease) 05/05/2016  . Seasonal rhinitis 05/05/2016  . Routine physical examination 05/05/2016  . Heel spur 11/05/2015  . Tendonitis 11/05/2015  . SOB (shortness of breath) 11/11/2014  . Morbid obesity ([place]) 11/11/2014  . Mixed hyperlipidemia 11/11/2014  . Essential hypertension 11/11/2014  . Anxiety and depression 11/11/2014  . Arthritis, senescent 11/11/2014    Orientation RESPIRATION BLADDER Height & Weight  Self, Situation, Time, Place  O2(2 Litesr Oxygen. ) Continent Weight: (!) 322 lb 9.6 oz (146.3  kg) Height:  5\' 5"  (165.1 cm)  BEHAVIORAL SYMPTOMS/MOOD NEUROLOGICAL BOWEL NUTRITION STATUS      Continent Diet(Diet: Regular )  AMBULATORY STATUS COMMUNICATION OF NEEDS Skin   Extensive Assist Verbally Surgical wounds, Wound Vac(Incision: Right Hip, Pro[place]a Wound Vac. )                       Personal Care Assistance Level of Assistance  Bathing, Feeding, Dressing Bathing Assistance: Limited assistance Feeding assistance: Independent Dressing Assistance: Limited assistance     Functional Limitations Info  Sight, Hearing, Speech Sight Info: Adequate Hearing Info: Adequate Speech Info: Adequate    SPECIAL CARE FACTORS FREQUENCY  PT (By licensed PT), OT (By licensed OT)     PT Frequency: (5) OT Frequency: (5)            Contractures      Additional Factors Info  Code Status, Allergies Code Status Info: (Full Code. ) Allergies Info: (Prednisone, Hydrocodone-acetaminophen, Oxycodone, Tramadol, Adhesive Tape, Latex)           Current Medications (08/21/2018):  This is the current hospital active medication list Current Facility-Administered Medications  Medication Dose Route Frequency Provider Last Rate Last Dose  . 0.9 %  sodium chloride infusion   Intravenous Continuous [name], MD 100 mL/hr at 08/21/18 1500    . acetaminophen (TYLENOL) tablet 1,000 mg  1,000 mg Oral Q6H [name], MD      . Derrill Memo ON 08/22/2018] acetaminophen (TYLENOL) tablet 325-650 mg  325-650 mg Oral Q6H PRN [name], MD      . ALPRAZolam Duanne Moron) tablet 0.25 mg  0.25 mg Oral Daily PRN [name], MD      . alum & mag hydroxide-simeth (MAALOX/MYLANTA) 200-200-20 MG/5ML suspension 30 mL  30 mL Oral Q4H PRN [name], MD      . amiodarone (PACERONE) tablet 200 mg  200 mg Oral Daily [name], MD      . apixaban Arne Cleveland) tablet 5 mg  5 mg Oral BID [name], MD      . B-complex with vitamin C tablet 1 tablet  1 tablet Oral Daily [name], MD      . bisacodyl  (DULCOLAX) EC tablet 5 mg  5 mg Oral Daily PRN [name], MD      . bisoprolol (ZEBETA) tablet 2.5 mg  2.5 mg Oral Daily [name], MD      . calcium gluconate tablet 500 mg  1 tablet Oral Daily [name], MD      . ceFAZolin (ANCEF) 3 g in dextrose 5 % 50 mL IVPB  3 g Intravenous Q6H [name], MD      . cholecalciferol (VITAMIN D3) tablet 5,000 Units  5,000 Units Oral Daily [name], MD      . diphenhydrAMINE (BENADRYL) 12.5 MG/5ML elixir 12.5-25 mg  12.5-25 mg Oral Q4H PRN [name], MD      . docusate sodium (COLACE) capsule 100 mg  100 mg Oral BID [name], MD      . fentaNYL (SUBLIMAZE) 100 MCG/2ML injection           . furosemide (LASIX) tablet 20 mg  20 mg Oral Daily [name], MD      . gabapentin (NEURONTIN) capsule 300-900 mg  300-900 mg Oral QID [name], MD      . hydrALAZINE (APRESOLINE) tablet  25 mg  25 mg Oral QID [name], MD      . HYDROmorphone (DILAUDID) injection 0.5-1 mg  0.5-1 mg Intravenous Q4H PRN [name], MD   1 mg at 08/21/18 1349  . losartan (COZAAR) tablet 25 mg  25 mg Oral Daily [name], MD      . magnesium citrate solution 1 Bottle  1 Bottle Oral Once PRN [name], MD      . magnesium hydroxide (MILK OF MAGNESIA) suspension 30 mL  30 mL Oral Daily PRN [name], MD      . menthol-cetylpyridinium (CEPACOL) lozenge 3 mg  1 lozenge Oral PRN [name], MD       Or  . phenol (CHLORASEPTIC) mouth spray 1 spray  1 spray Mouth/Throat PRN [name], MD      . methocarbamol (ROBAXIN) tablet 500 mg  500 mg Oral Q6H PRN [name], MD       Or  . methocarbamol (ROBAXIN) 500 mg in dextrose 5 % 50 mL IVPB  500 mg Intravenous Q6H PRN [name], MD      . metoCLOPramide (REGLAN) tablet 5-10 mg  5-10 mg Oral Q8H PRN [name], MD       Or  . metoCLOPramide (REGLAN) injection 5-10 mg  5-10 mg Intravenous Q8H PRN [name], MD      . multivitamin with minerals tablet 1 tablet  1 tablet Oral  Daily [name], MD      . ondansetron [facility]) tablet 4 mg  4 mg Oral Q6H PRN [name], MD       Or  . ondansetron [facility]) injection 4 mg  4 mg Intravenous Q6H PRN [name], MD      . oxyCODONE (Oxy IR/ROXICODONE) immediate release tablet 10-15 mg  10-15 mg Oral Q4H PRN [name], MD   15 mg at 08/21/18 1442  . oxyCODONE (Oxy IR/ROXICODONE) immediate release tablet 5-10 mg  5-10 mg Oral Q4H PRN [name], MD      . oxymetazoline (AFRIN) 0.05 % nasal spray 1 spray  1 spray Each Nare QHS PRN [name], MD      . Derrill Memo ON 08/22/2018] pantoprazole (PROTONIX) EC tablet 40 mg  40 mg Oral Daily [name], MD      . potassium chloride (K-DUR,KLOR-CON) CR tablet 10 mEq  10 mEq Oral Daily [name], MD      . ranolazine (RANEXA) 12 hr tablet 500 mg  500 mg Oral BID [name], MD      . rosuvastatin (CRESTOR) tablet 20 mg  20 mg Oral Daily [name], MD      . Derrill Memo ON 08/22/2018] sertraline (ZOLOFT) tablet 100 mg  100 mg Oral Daily [name], MD      . SUMAtriptan (IMITREX) tablet 100 mg  100 mg Oral Q2H PRN [name], MD      . zolpidem (AMBIEN) tablet 5 mg  5 mg Oral QHS PRN [name], MD         Discharge Medications: Please see discharge summary for a list of discharge medications.  Relevant Imaging Results:  Relevant Lab Results:   Additional Information (SSN: [id number])  Maleak Brazzel, Veronia Beets, LCSW

## 2018-08-21 NOTE — Progress Notes (Signed)
15 minute call to floor. 

## 2018-08-21 NOTE — H&P (Signed)
Reviewed paper H+P, will be scanned into chart. No changes noted.  

## 2018-08-21 NOTE — Anesthesia Procedure Notes (Signed)
Procedure Name: Intubation Performed by: Rolla Plate, CRNA Pre-anesthesia Checklist: Patient identified, Patient being monitored, Timeout performed, Emergency Drugs available and Suction available Patient Re-evaluated:Patient Re-evaluated prior to induction Oxygen Delivery Method: Circle system utilized Preoxygenation: Pre-oxygenation with 100% oxygen Induction Type: IV induction and Rapid sequence Ventilation: Mask ventilation without difficulty Laryngoscope Size: 3 and McGraph Grade View: Grade II Tube type: Oral Tube size: 7.0 mm Number of attempts: 1 Airway Equipment and Method: Stylet,  Video-laryngoscopy and Patient positioned with wedge pillow Placement Confirmation: ETT inserted through vocal cords under direct vision,  positive ETCO2 and breath sounds checked- equal and bilateral Secured at: 21 cm Tube secured with: Tape Dental Injury: Teeth and Oropharynx as per pre-operative assessment  Difficulty Due To: Difficulty was anticipated, Difficult Airway- due to limited oral opening and Difficult Airway- due to large tongue Future Recommendations: Recommend- induction with short-acting agent, and alternative techniques readily available

## 2018-08-22 LAB — BASIC METABOLIC PANEL
Anion gap: 4 — ABNORMAL LOW (ref 5–15)
BUN: 19 mg/dL (ref 8–23)
CO2: 30 mmol/L (ref 22–32)
Calcium: 8.3 mg/dL — ABNORMAL LOW (ref 8.9–10.3)
Chloride: 105 mmol/L (ref 98–111)
Creatinine, Ser: 0.95 mg/dL (ref 0.44–1.00)
GFR calc Af Amer: >60 mL/min (ref >60)
GFR calc non Af Amer: >60 mL/min (ref >60)
Glucose, Bld: 115 mg/dL — ABNORMAL HIGH (ref 70–99)
Potassium: 4.4 mmol/L (ref 3.5–5.1)
Sodium: 139 mmol/L (ref 135–145)

## 2018-08-22 LAB — CBC
HCT: 38.5 % (ref 36.0–46.0)
Hemoglobin: 11.6 g/dL — ABNORMAL LOW (ref 12.0–15.0)
MCH: 30.4 pg (ref 26.0–34.0)
MCHC: 30.1 g/dL (ref 30.0–36.0)
MCV: 100.8 fL — ABNORMAL HIGH (ref 80.0–100.0)
Platelets: 160 10*3/uL (ref 150–400)
RBC: 3.82 MIL/uL — ABNORMAL LOW (ref 3.87–5.11)
RDW: 13.3 % (ref 11.5–15.5)
WBC: 12 10*3/uL — ABNORMAL HIGH (ref 4.0–10.5)
nRBC: 0 % (ref 0.0–0.2)

## 2018-08-22 MED ORDER — CALCIUM CARBONATE ANTACID 500 MG PO CHEW
1.0000 | CHEWABLE_TABLET | Freq: Every day | ORAL | Status: DC
  Administered 2018-08-22 – 2018-08-25 (×4): 200 mg via ORAL
  Filled 2018-08-22 (×4): qty 1

## 2018-08-22 NOTE — Progress Notes (Signed)
2nd bladder scan revealed 219ml in urine. Pt does not have urge to urinate at this time. Will continue to assess and monitor.  Bethann Punches, RN

## 2018-08-22 NOTE — Progress Notes (Signed)
Physical Therapy Treatment Patient Details Name: Amber Barnes MRN: [id number] DOB: [birth year] Today's Date: 08/22/2018    History of Present Illness admitted for acute hospitalization status post R THA, anterior approach (08/21/2018), PWB.    PT Comments    Patient seated on BSC upon arrival to session; unable to void indep.  Reporting significant pain to L hip.  Denies dizziness, but seated BP noted at 70/54, HR 61.  Required heavy +2 assist for return to bed; mod +2 for sit/stand and standing balance, total assist for walker position, management and weight shifting. Unable to tolerate additional activity due to pain in L LE.   Of note, desat to 82-83% on RA, requiring reapplication of supplemental O2 for recovery >90%.  RN informed/aware.    Follow Up Recommendations  SNF     Equipment Recommendations       Recommendations for Other Services       Precautions / Restrictions Precautions Precautions: Fall;Anterior Hip Restrictions Weight Bearing Restrictions: Yes RLE Weight Bearing: Partial weight bearing RLE Partial Weight Bearing Percentage or Pounds: 50    Mobility  Bed Mobility Overal bed mobility: Needs Assistance Bed Mobility: Sit to Supine     Supine to sit: Mod assist;Max assist Sit to supine: Total assist;+2 for physical assistance   General bed mobility comments: total assist for trunk, LE management  Transfers Overall transfer level: Needs assistance Equipment used: Rolling walker (2 wheeled) Transfers: Sit to/from Stand Sit to Stand: Mod assist;+2 physical assistance         General transfer comment: assist for lift off, standing balance; unable to tolerate stance or additional activity due to pain, hypotension  Ambulation/Gait             General Gait Details: unsafe/unable due to symptomatic orthostasis   Stairs             Wheelchair Mobility    Modified Rankin (Stroke Patients Only)       Balance Overall balance  assessment: Needs assistance Sitting-balance support: No upper extremity supported;Feet supported Sitting balance-Leahy Scale: Fair     Standing balance support: Bilateral upper extremity supported Standing balance-Leahy Scale: Poor                              Cognition Arousal/Alertness: Lethargic Behavior During Therapy: WFL for tasks assessed/performed Overall Cognitive Status: Within Functional Limits for tasks assessed                                        Exercises Other Exercises Other Exercises: BSC to bed, SPT with RW, mod assist +2 for lift off, standing balance.  Total assist for walker position, management and initiation of weight shift/pivot. Other Exercises: Sit/stand x2 with RW, elevated bed surface, mod assist +2.  Extensive encouragement required due to pain.    General Comments        Pertinent Vitals/Pain Pain Assessment: Faces Pain Score: 10-Worst pain ever Faces Pain Scale: Hurts whole lot Pain Location: L hip Pain Descriptors / Indicators: Aching;Guarding;Grimacing Pain Intervention(s): Limited activity within patient's tolerance;Monitored during session;Repositioned    Home Living Family/patient expects to be discharged to:: Private residence Living Arrangements: Spouse/significant other Available Help at Discharge: Family;Available PRN/intermittently Type of Home: Apartment Home Access: Level entry   Home Layout: One level Home Equipment: Walker - 2 wheels;Walker - 4 wheels  Prior Function Level of Independence: Needs assistance      Comments: Mod indep for ADLs, household and limited community mobilization with 4WRW; no home O2; endorses multiple fall history.   PT Goals (current goals can now be found in the care plan section) Acute Rehab PT Goals Patient Stated Goal: to be able to get to the bathroom PT Goal Formulation: With patient/family Time For Goal Achievement: 09/05/18 Potential to Achieve Goals:  Good Progress towards PT goals: Progressing toward goals    Frequency    BID      PT Plan Current plan remains appropriate    Co-evaluation              AM-PAC PT "6 Clicks" Mobility   Outcome Measure  Help needed turning from your back to your side while in a flat bed without using bedrails?: Total Help needed moving from lying on your back to sitting on the side of a flat bed without using bedrails?: Total Help needed moving to and from a bed to a chair (including a wheelchair)?: A Lot Help needed standing up from a chair using your arms (e.g., wheelchair or bedside chair)?: A Lot Help needed to walk in hospital room?: Total Help needed climbing 3-5 steps with a railing? : Total 6 Click Score: 8    End of Session Equipment Utilized During Treatment: Gait belt;Oxygen Activity Tolerance: Treatment limited secondary to medical complications (Comment)(symptomatic orthostasis) Patient left: in bed;with call bell/phone within reach;with bed alarm set;with family/visitor present Nurse Communication: Mobility status PT Visit Diagnosis: Muscle weakness (generalized) (M62.81);Difficulty in walking, not elsewhere classified (R26.2);Pain Pain - Right/Left: Right Pain - part of body: Hip     Time: 1425-1441 PT Time Calculation (min) (ACUTE ONLY): 16 min  Charges:  $Therapeutic Exercise: 8-22 mins $Therapeutic Activity: 8-22 mins                     Paula Zietz H. Owens Shark, PT, DPT, NCS 08/22/18, 2:56 PM [phone]

## 2018-08-22 NOTE — Progress Notes (Signed)
Pt has not voided since foley removal. Bladder scan revealed <100 in bladder. Will continue to monitor.  Bethann Punches, RN

## 2018-08-22 NOTE — Progress Notes (Signed)
   Subjective: 1 Day Post-Op Procedure(s) (LRB): TOTAL HIP ARTHROPLASTY ANTERIOR APPROACH-RIGHT CELL SAVER REQUESTED (Right) Patient reports pain as moderate.  Spasms to the right anterior thigh.  No numbness or tingling. Patient is well, and has had no acute complaints or problems Denies any CP, SOB, ABD pain. We will start physical therapy today.  Plan is to go Skilled nursing facility after hospital stay.  Objective: Vital signs in last 24 hours: Temp:  [97 F (36.1 C)-99.2 F (37.3 C)] 98.7 F (37.1 C) (02/12 0722) Pulse Rate:  [50-73] 73 (02/12 0722) Resp:  [11-20] 16 (02/12 0722) BP: (101-157)/(51-80) 121/54 (02/12 0722) SpO2:  [93 %-100 %] 93 % (02/12 0722) Weight:  [146.3 kg] 146.3 kg (02/11 1412)  Intake/Output from previous day: 02/11 0701 - 02/12 0700 In: 1603.7 [I.V.:1003.7; Blood:350; IV Piggyback:250] Out: 6269 [Urine:1000; Drains:35; Blood:550] Intake/Output this shift: No intake/output data recorded.  Recent Labs    08/22/18 0441  HGB 11.6*   Recent Labs    08/22/18 0441  WBC 12.0*  RBC 3.82*  HCT 38.5  PLT 160   Recent Labs    08/22/18 0441  NA 139  K 4.4  CL 105  CO2 30  BUN 19  CREATININE 0.95  GLUCOSE 115*  CALCIUM 8.3*   No results for input(s): LABPT, INR in the last 72 hours.  EXAM General - Patient is Alert, Appropriate and Oriented Extremity - Neurovascular intact Sensation intact distally Intact pulses distally Dorsiflexion/Plantar flexion intact No cellulitis present Compartment soft Dressing - dressing C/D/I and no drainage Hemovac and Praveena dressing intact. Motor Function - intact, moving foot and toes well on exam.   Past Medical History:  Diagnosis Date  . Achilles tendinitis   . Acute medial meniscal tear   . Allergy   . Anxiety   . Arthritis   . Atrial fibrillation ([place])    a. new onset 07/2016; b. CHADS2VASc --> 2 (HTN, female); c. eliquis  . CHF (congestive heart failure) ([place])   . Chronic lower back  pain   . COPD (chronic obstructive pulmonary disease) ([place])   . Depression   . Fibrocystic breast disease   . GERD (gastroesophageal reflux disease)   . Hyperlipidemia   . Hypertension   . Insomnia   . Lipoma   . Migraine   . Osteoarthritis    left knee  . Osteopenia   . Sleep apnea    does not use her cpap  . Tendonitis   . Thyroid disease   . Vitamin D deficiency     Assessment/Plan:   1 Day Post-Op Procedure(s) (LRB): TOTAL HIP ARTHROPLASTY ANTERIOR APPROACH-RIGHT CELL SAVER REQUESTED (Right) Active Problems:   Status post total hip replacement, right  Estimated body mass index is 53.68 kg/m as calculated from the following:   Height as of this encounter: 5\' 5"  (1.651 m).   Weight as of this encounter: 146.3 kg. Advance diet Up with therapy, partial weightbearing right lower extremity x4 weeks Labs and vital signs are stable. Recheck labs in the morning. Needs bowel movement Care manager to assist with discharge to skilled nursing facility.  DVT Prophylaxis - TED hose and SCDs, Eliquis Partial weightbearing right lower extremity   T. Rachelle Hora, PA-C [place] 08/22/2018, 7:52 AM

## 2018-08-22 NOTE — Evaluation (Signed)
Physical Therapy Evaluation Patient Details Name: Amber Barnes MRN: [id number] DOB: July 30, 1950 Today's Date: 08/22/2018   History of Present Illness  admitted for acute hospitalization status post R THA, anterior approach (08/21/2018), PWB.  Clinical Impression  Upon evaluation, patient awake (but generally lethargic/fatigued) and oriented to basic information; follows commands, but often requires redirection to task/conversation at hand.  Endorses pain 10/10, but frequently closing eyes to sleep/rest intermittently throughout session.  R LE generally guarded and painful to movement in all planes, requiring act assist for all supine therex and mobility efforts.  Currently requiring mod/max assist +2 for bed mobility, cga/min assist for unsupported sitting balance, mod assist +2 for sit/stand and static standing balance with RW.  Unable to maintain beyond 5-10 seconds due to dizziness with transition to upright.  Significant orthostasis noted with formal assessment; see vitals flowsheet for details.  RN informed/aware.  Additional OOB/mobility tasks deferred as result.  Will continue to assess/progress in subsequent sessions as medically appropriate. Would benefit from skilled PT to address above deficits and promote optimal return to PLOF; recommend transition to STR upon discharge from acute hospitalization.     Follow Up Recommendations SNF    Equipment Recommendations       Recommendations for Other Services       Precautions / Restrictions Precautions Precautions: Anterior Hip;Fall Precaution Booklet Issued: No Precaution Comments: R hip Restrictions Weight Bearing Restrictions: Yes RLE Weight Bearing: Partial weight bearing RLE Partial Weight Bearing Percentage or Pounds: 50      Mobility  Bed Mobility Overal bed mobility: Needs Assistance Bed Mobility: Supine to Sit;Sit to Supine     Supine to sit: Mod assist;Max assist Sit to supine: Total assist;+2 for physical  assistance   General bed mobility comments: Deferred. Pt with increased pain and lethargy on this date. Limited assessment to bed level activities. Pt endorses need for assistance with bed mobility at baseline for mgmt of R LE.   Transfers Overall transfer level: Needs assistance Equipment used: Rolling walker (2 wheeled) Transfers: Sit to/from Stand Sit to Stand: Mod assist;+2 physical assistance         General transfer comment: cuing for hand/foot placement, sequence and task initiation; requires elevated bed surface, UE support and +2 to complete. Unable to maintain beyond 5-10 seconds due to dizziness with transition to upright.  Ambulation/Gait             General Gait Details: unsafe/unable due to symptomatic orthostasis  Stairs            Wheelchair Mobility    Modified Rankin (Stroke Patients Only)       Balance Overall balance assessment: Needs assistance Sitting-balance support: Feet supported;No upper extremity supported Sitting balance-Leahy Scale: Fair     Standing balance support: Bilateral upper extremity supported Standing balance-Leahy Scale: Poor                               Pertinent Vitals/Pain Pain Assessment: 0-10 Pain Score: 10-Worst pain ever Pain Descriptors / Indicators: Aching;Guarding;Grimacing Pain Intervention(s): Limited activity within patient's tolerance;Monitored during session;Premedicated before session;Repositioned    Home Living Family/patient expects to be discharged to:: Private residence Living Arrangements: Spouse/significant other Available Help at Discharge: Family;Available PRN/intermittently Type of Home: Apartment Home Access: Level entry     Home Layout: One level Home Equipment: Walker - 2 wheels;Walker - 4 wheels      Prior Function Level of Independence: Needs assistance  Gait / Transfers Assistance Needed: Using RW for mobility within the home.   ADL's / Homemaking Assistance  Needed: Pt endorses that her husband provides supervision for safety and occasional assistance with bathing and dressing.   Comments: Mod indep for ADLs, household and limited community mobilization with 4WRW; no home O2; endorses multiple fall history.     Hand Dominance   Dominant Hand: Right    Extremity/Trunk Assessment   Upper Extremity Assessment Upper Extremity Assessment: Overall WFL for tasks assessed    Lower Extremity Assessment Lower Extremity Assessment: Generalized weakness(R hip grossly 3-/5, limited by pain; otherwise, LEs at least 4-/5 throughout) RLE Deficits / Details: s/p R THA RLE: Unable to fully assess due to pain       Communication   Communication: No difficulties  Cognition Arousal/Alertness: Lethargic Behavior During Therapy: WFL for tasks assessed/performed Overall Cognitive Status: Within Functional Limits for tasks assessed                                 General Comments: Pt occasionally closing eyes and appearing to be asleep during evaluation. Was easily awoken and re-engaged.       General Comments General comments (skin integrity, edema, etc.): Pt endorsed PMH of anxiety and depression, but states that she is being seen by a mental health professional. Pt also endorses having at least 5 falls in the last 6 months, which she says occur because she tries to put all of her weight on her left leg 2/2 right leg pain and loses her balance. Suspect, high anxiety about falls may limit participation in ADLs     Exercises Other Exercises Other Exercises: Supine LE therex, 1x10, act assist ROM: ankle pumps, quad sets, SAQs, heel slides, hip abduct/adduct.  Very guarded with ROM through all planes. Other Exercises: Sit/stand x2 with RW, elevated bed surface, mod assist +2.  Extensive encouragement required due to pain.   Assessment/Plan    PT Assessment Patient needs continued PT services  PT Problem List Decreased strength;Decreased  range of motion;Decreased activity tolerance;Decreased balance;Decreased mobility;Decreased coordination;Decreased knowledge of use of DME;Decreased safety awareness;Decreased knowledge of precautions;Cardiopulmonary status limiting activity;Obesity;Decreased skin integrity;Pain       PT Treatment Interventions DME instruction;Gait training;Stair training;Functional mobility training;Therapeutic activities;Therapeutic exercise;Balance training;Patient/family education    PT Goals (Current goals can be found in the Care Plan section)  Acute Rehab PT Goals Patient Stated Goal: to be able to get to the bathroom PT Goal Formulation: With patient/family Time For Goal Achievement: 09/05/18 Potential to Achieve Goals: Good    Frequency BID   Barriers to discharge        Co-evaluation               AM-PAC PT "6 Clicks" Mobility  Outcome Measure Help needed turning from your back to your side while in a flat bed without using bedrails?: A Lot Help needed moving from lying on your back to sitting on the side of a flat bed without using bedrails?: A Lot Help needed moving to and from a bed to a chair (including a wheelchair)?: Total Help needed standing up from a chair using your arms (e.g., wheelchair or bedside chair)?: A Lot Help needed to walk in hospital room?: Total Help needed climbing 3-5 steps with a railing? : Total 6 Click Score: 9    End of Session Equipment Utilized During Treatment: Gait belt;Oxygen Activity Tolerance: Treatment limited secondary to  medical complications (Comment)(symptomatic orthostasis with transition to upright) Patient left: in bed;with call bell/phone within reach;with bed alarm set;with family/visitor present Nurse Communication: Mobility status PT Visit Diagnosis: Muscle weakness (generalized) (M62.81);Difficulty in walking, not elsewhere classified (R26.2);Pain Pain - Right/Left: Right Pain - part of body: Hip    Time: 0981-1914 PT Time  Calculation (min) (ACUTE ONLY): 42 min   Charges:   PT Evaluation $PT Eval Moderate Complexity: 1 Mod PT Treatments $Therapeutic Exercise: 8-22 mins $Therapeutic Activity: 8-22 mins        Amber Barnes H. Owens Shark, PT, DPT, NCS 08/22/18, 11:58 AM [phone]

## 2018-08-22 NOTE — Clinical Social Work Placement (Signed)
   CLINICAL SOCIAL WORK PLACEMENT  NOTE  Date:  08/22/2018  Patient Details  Name: Amber Barnes MRN: [id number] Date of Birth: Jan 13, 1951  Clinical Social Work is seeking post-discharge placement for this patient at the Advance level of care (*CSW will initial, date and re-position this form in  chart as items are completed):  Yes   Patient/family provided with [facility] Work Department's list of facilities offering this level of care within the geographic area requested by the patient (or if unable, by the patient's family).  Yes   Patient/family informed of their freedom to choose among providers that offer the needed level of care, that participate in Medicare, Medicaid or managed care program needed by the patient, have an available bed and are willing to accept the patient.  Yes   Patient/family informed of [facility]'s ownership interest in [facility] and [facility], as well as of the fact that they are under no obligation to receive care at these facilities.  PASRR submitted to EDS on 08/21/18     PASRR number received on 08/21/18     Existing PASRR number confirmed on       FL2 transmitted to all facilities in geographic area requested by pt/family on 08/21/18     FL2 transmitted to all facilities within larger geographic area on       Patient informed that his/her managed care company has contracts with or will negotiate with certain facilities, including the following:        Yes   Patient/family informed of bed offers received.  Patient chooses bed at [facility] )     Physician recommends and patient chooses bed at      Patient to be transferred to   on  .  Patient to be transferred to facility by       Patient family notified on   of transfer.  Name of family member notified:        PHYSICIAN       Additional Comment:    _______________________________________________ Dare Sanger, Veronia Beets,  LCSW 08/22/2018, 4:56 PM

## 2018-08-22 NOTE — Evaluation (Signed)
Occupational Therapy Evaluation Patient Details Name: Amber Barnes MRN: [id number] DOB: Jan 13, 1951 Today's Date: 08/22/2018    History of Present Illness Amber Barnes is a 68 yo femaile s/p R anterior THA. Pt has PMH of afib, COPD, hypertension, and hyperlipidemia.    Clinical Impression   Pt seen for OT evaluation this date, POD#1 from above surgery. Pt required assistance in most ADLs prior to surgery, and was using a 4WW for mobility within her home 2/2 R hip pain. Pt endorses multiple falls within her home and states that she primarily falls because she loses her balance while trying to use only her Left LE for functional mobility and transfers. Pt does not endorse having any adaptive equipment within her home and has decreased caregiver support during the day while her husband is at work. Pt is eager to return to a more active lifestyle with increased functional independence, less pain, and improved safety.  Pt currently requires mod assist for LB ADLs while in seated position due to pain and limited AROM of R hip (see below for further details on functional limitations). Pt instructed in self care skills, falls prevention strategies, and home/routines modifications. Pt would benefit from additional instruction in self care skills and techniques to help maintain precautions with or without assistive devices to support recall and carryover prior to discharge. Recommend STR upon hospital discharge.      Follow Up Recommendations  SNF    Equipment Recommendations  3 in 1 bedside commode;Tub/shower bench    Recommendations for Other Services       Precautions / Restrictions Precautions Precautions: Anterior Hip Precaution Booklet Issued: No Precaution Comments: R hip Restrictions Weight Bearing Restrictions: Yes RLE Weight Bearing: Weight bearing as tolerated RLE Partial Weight Bearing Percentage or Pounds: 50      Mobility Bed Mobility Overal bed mobility: Needs  Assistance             General bed mobility comments: Deferred. Pt with increased pain and lethargy on this date. Limited assessment to bed level activities. Pt endorses need for assistance with bed mobility at baseline for mgmt of R LE.   Transfers                 General transfer comment: Deferred. Pt with increased pain and lethargy on this date. Limited assessment to bed level activities.     Balance Overall balance assessment: Needs assistance                                         ADL either performed or assessed with clinical judgement   ADL Overall ADL's : Needs assistance/impaired Eating/Feeding: Set up;Supervision/ safety   Grooming: Set up;Bed level;Supervision/safety   Upper Body Bathing: Set up;Min guard;Sitting   Lower Body Bathing: Set up;Moderate assistance;Sit to/from stand   Upper Body Dressing : Set up;Min guard;Sitting   Lower Body Dressing: Set up;Moderate assistance;Sit to/from stand;Adhering to hip precautions;Cueing for safety   Toilet Transfer: Set up;Moderate assistance;RW;Stand-pivot;BSC   Toileting- Clothing Manipulation and Hygiene: Set up;Moderate assistance;With adaptive equipment;Adhering to hip precautions;Sit to/from stand               Vision Baseline Vision/History: Wears glasses Wears Glasses: Distance only Patient Visual Report: No change from baseline Additional Comments: Pt stated that she suspects her medications may be impacting her vision but did not elaborate further when asked. Denies blurry/double vision.  Perception     Praxis      Pertinent Vitals/Pain Pain Assessment: 0-10 Pain Score: 10-Worst pain ever Pain Descriptors / Indicators: Grimacing;Operative site guarding Pain Intervention(s): Limited activity within patient's tolerance;Monitored during session;RN gave pain meds during session     Hand Dominance Right   Extremity/Trunk Assessment Upper Extremity Assessment Upper  Extremity Assessment: Overall WFL for tasks assessed(Pt observed to use B UE for self-feeding and lmited bed mobility. )   Lower Extremity Assessment Lower Extremity Assessment: Defer to PT evaluation;RLE deficits/detail RLE Deficits / Details: s/p R THA RLE: Unable to fully assess due to pain       Communication Communication Communication: No difficulties   Cognition Arousal/Alertness: Lethargic Behavior During Therapy: WFL for tasks assessed/performed Overall Cognitive Status: Within Functional Limits for tasks assessed                                 General Comments: Pt occasionally closing eyes and appearing to be asleep during evaluation. Was easily awoken and re-engaged.    General Comments  Pt endorsed PMH of anxiety and depression, but states that she is being seen by a mental health professional. Pt also endorses having at least 5 falls in the last 6 months, which she says occur because she tries to put all of her weight on her left leg 2/2 right leg pain and loses her balance. Suspect, high anxiety about falls may limit participation in ADLs     Exercises Other Exercises Other Exercises: Pt educated in falls prevention strategies including safe use of AE for ADLs, increased activity and strengthening, and suggestions for routines modifications to improve functional independence and safety within the home.  Other Exercises: Pt endorsed having high levels of anxiety and prior depressive episodes which have limited her community engagement and IADL function. OT provided active listening and encouragement t/o evaluation.    Shoulder Instructions      Home Living Family/patient expects to be discharged to:: Private residence Living Arrangements: Spouse/significant other Available Help at Discharge: Family;Available PRN/intermittently(Spouse works t/o day and sometimes on weekends. ) Type of Home: Apartment Home Access: Level entry     Home Layout: One level      Bathroom Shower/Tub: Teacher, early years/pre: Standard     Home Equipment: Environmental consultant - 2 wheels;Walker - 4 wheels          Prior Functioning/Environment Level of Independence: Needs assistance  Gait / Transfers Assistance Needed: Using RW for mobility within the home.  ADL's / Homemaking Assistance Needed: Pt endorses that her husband provides supervision for safety and occasional assistance with bathing and dressing.    Comments: Pt does drive, however is limited in her community mobility 2/2 R hip pain. Husband supports with IADL. Pt states she primarily stays inside her home.        OT Problem List: Decreased strength;Impaired balance (sitting and/or standing);Decreased range of motion;Decreased coordination;Decreased activity tolerance;Decreased knowledge of use of DME or AE;Decreased safety awareness;Decreased knowledge of precautions;Cardiopulmonary status limiting activity;Pain      OT Treatment/Interventions:      OT Goals(Current goals can be found in the care plan section) Acute Rehab OT Goals Patient Stated Goal: To be able to walk her dog and be more active again.  OT Goal Formulation: With patient Time For Goal Achievement: 09/05/18 Potential to Achieve Goals: Good ADL Goals Pt Will Perform Grooming: sitting;with supervision;with adaptive equipment(With LRAD for  safety and improved functional independence.) Pt Will Perform Lower Body Dressing: with adaptive equipment;sit to/from stand;with min guard assist(With LRAD for safety and improved functional independence.) Pt Will Transfer to Toilet: with min guard assist;bedside commode;ambulating(With LRAD for safety and improved functional independence.) Additional ADL Goal #1: Pt will independently verbalize plan to implement at least 3 falls prevention strategies within her home environment in order to minimize risk of falls upon DC.  OT Frequency:     Barriers to D/C: Decreased caregiver support  Pt does  not have consistent caregiver support 24/7. Husband works t/o the day.         Co-evaluation              AM-PAC OT "6 Clicks" Daily Activity     Outcome Measure Help from another person eating meals?: A Little Help from another person taking care of personal grooming?: A Little Help from another person toileting, which includes using toliet, bedpan, or urinal?: A Lot Help from another person bathing (including washing, rinsing, drying)?: A Lot Help from another person to put on and taking off regular upper body clothing?: A Little Help from another person to put on and taking off regular lower body clothing?: A Lot 6 Click Score: 15   End of Session    Activity Tolerance: Patient limited by pain;Patient limited by lethargy Patient left: in bed;with bed alarm set;with call bell/phone within reach;with nursing/sitter in room;with SCD's reapplied(With RN in room to administer medications. )  OT Visit Diagnosis: Unsteadiness on feet (R26.81);Other abnormalities of gait and mobility (R26.89);Repeated falls (R29.6);History of falling (Z91.81);Pain Pain - Right/Left: Right Pain - part of body: Hip                Time: 2956-2130 OT Time Calculation (min): 25 min Charges:  OT General Charges $OT Visit: 1 Visit OT Evaluation $OT Eval Moderate Complexity: 1 Mod OT Treatments $Self Care/Home Management : 8-22 mins  Shara Blazing, M.S., OTR/L Ascom: [phone] 08/22/18, 9:51 AM

## 2018-08-22 NOTE — Care Management Note (Signed)
Case Management Note  Patient Details  Name: Ciarah Peace MRN: [id number] Date of Birth: February 09, 1951  Subjective/Objective:                  Met with the patient to discuss DC plan, [facility] list provided per CMS.gov Patient is declining HH and wants to go to rehab, [facility] Patient has a RW at home Patient lives with spouse that works and has no help at home during the day Has transportation Patient pcp is [facility] is [facility] preferred [facility] Continue to monitor for any needs,    Action/Plan: [facility] list provided per CMS.gov  Expected Discharge Date:                  Expected Discharge Plan:     In-House Referral:     Discharge planning Services  CM Consult  Post Acute Care Choice:    Choice offered to:     DME Arranged:    DME Agency:     HH Arranged:    HH Agency:     Status of Service:  In process, will continue to follow  If discussed at Long Length of Stay Meetings, dates discussed:    Additional Comments:  Su Hilt, RN 08/22/2018, 3:07 PM

## 2018-08-22 NOTE — Clinical Social Work Note (Signed)
Clinical Social Work Assessment  Patient Details  Name: Amber Barnes MRN: [id number] Date of Birth: Jun 27, 1951  Date of referral:  08/22/18               Reason for consult:  Facility Placement                Permission sought to share information with:  Chartered certified accountant granted to share information::  Yes, Verbal Permission Granted  Name::      [place]::   [place]   Relationship::     Contact Information:     Housing/Transportation Living arrangements for the past 2 months:  [place] of Information:  Patient, Spouse, Adult Children Patient Interpreter Needed:  None Criminal Activity/Legal Involvement Pertinent to Current Situation/Hospitalization:  No - Comment as needed Significant Relationships:  Adult Children, Spouse Lives with:  Spouse Do you feel safe going back to the place where you live?  Yes Need for family participation in patient care:  Yes (Comment)  Care giving concerns:  Patient lives in [place] with her husband Dellis Filbert.    Social Worker assessment / plan:  Holiday representative (CSW) received SNF consult. PT is recommending SNF. CSW met with patient and her husband Dellis Filbert and daughter in law Helene Kelp were at bedside. Patient was alert and oriented X4. CSW introduced self and explained role of CSW department. Per patient she lives in [place] with her husband. CSW explained SNF process and [employer] will have to approve SNF. Patient and her family are agreeable to SNF search in [facility] and prefer [facility]. FL2 complete and faxed out.   CSW presented bed offers to patient and her daughter in law. CSW discussed that quality measures of the facilities. Patient and daughter in law chose [facility]. Per [facility] admissions coordinator at [facility] she will start [facility] SN[facility] authorization today. CSW will continue to follow and assist as needed.   Employment status:  Disabled  (Comment on whether or not currently receiving Disability), Retired Nurse, adult PT Recommendations:  [place] / Referral to community resources:  [place]  Patient/Family's Response to care:  Patient chose [facility].   Patient/Family's Understanding of and Emotional Response to Diagnosis, Current Treatment, and Prognosis:  Patient and her family were very pleasant and thanked CSW for assistance.   Emotional Assessment Appearance:  Appears stated age Attitude/Demeanor/Rapport:    Affect (typically observed):  Accepting, Adaptable, Pleasant Orientation:  Oriented to Self, Oriented to Place, Oriented to  Time, Oriented to Situation Alcohol / Substance use:  Not Applicable Psych involvement (Current and /or in the community):  No (Comment)  Discharge Needs  Concerns to be addressed:  Discharge Planning Concerns Readmission within the last 30 days:  No Current discharge risk:  Dependent with Mobility Barriers to Discharge:  Continued Medical Work up   [employer], Veronia Beets, LCSW 08/22/2018, 4:57 PM

## 2018-08-22 NOTE — Progress Notes (Signed)
   08/22/18 1700  Clinical Encounter Type  Visited With Patient  Visit Type Initial  Referral From Nurse  Spiritual Encounters  Spiritual Needs Prayer  Ch responded to a pager requesting an AD education the pt. Pt explained that she wants her son and daughter-in-law be he HCPOA. Pt was drowsy and going in and out of sleep due to medicine so ch left the document with her informing her of ways to reach out to a chaplain. Pt has deep faith and gains spiritual strength from her faith. Her adopted son and daughter have issues with drug use and she keeps them in prayer. Pt also feels the need to stay alive in order to provide better life for her grandchildren. Pt requested prayer so ch offered a prayer of healing. Pt was very appreciative noting that she feels more peaceful.

## 2018-08-23 ENCOUNTER — Inpatient Hospital Stay: Payer: Medicare Other

## 2018-08-23 ENCOUNTER — Encounter: Payer: Self-pay | Admitting: Radiology

## 2018-08-23 LAB — CBC
HCT: 34.3 % — ABNORMAL LOW (ref 36.0–46.0)
Hemoglobin: 10.5 g/dL — ABNORMAL LOW (ref 12.0–15.0)
MCH: 31.2 pg (ref 26.0–34.0)
MCHC: 30.6 g/dL (ref 30.0–36.0)
MCV: 101.8 fL — ABNORMAL HIGH (ref 80.0–100.0)
Platelets: 121 10*3/uL — ABNORMAL LOW (ref 150–400)
RBC: 3.37 MIL/uL — ABNORMAL LOW (ref 3.87–5.11)
RDW: 13.2 % (ref 11.5–15.5)
WBC: 9.7 10*3/uL (ref 4.0–10.5)
nRBC: 0 % (ref 0.0–0.2)

## 2018-08-23 LAB — BASIC METABOLIC PANEL
Anion gap: 3 — ABNORMAL LOW (ref 5–15)
BUN: 17 mg/dL (ref 8–23)
CO2: 31 mmol/L (ref 22–32)
Calcium: 8.1 mg/dL — ABNORMAL LOW (ref 8.9–10.3)
Chloride: 105 mmol/L (ref 98–111)
Creatinine, Ser: 0.81 mg/dL (ref 0.44–1.00)
GFR calc Af Amer: >60 mL/min (ref >60)
GFR calc non Af Amer: >60 mL/min (ref >60)
Glucose, Bld: 111 mg/dL — ABNORMAL HIGH (ref 70–99)
Potassium: 4.2 mmol/L (ref 3.5–5.1)
Sodium: 139 mmol/L (ref 135–145)

## 2018-08-23 LAB — SURGICAL PATHOLOGY

## 2018-08-23 LAB — GLUCOSE, CAPILLARY: Glucose-Capillary: 109 mg/dL — ABNORMAL HIGH (ref 70–99)

## 2018-08-23 MED ORDER — IOHEXOL 350 MG/ML SOLN
75.0000 mL | Freq: Once | INTRAVENOUS | Status: AC | PRN
  Administered 2018-08-23: 75 mL via INTRAVENOUS

## 2018-08-23 NOTE — Progress Notes (Signed)
PT Cancellation Note  Patient Details Name: Amber Barnes MRN: [id number] DOB: 28-Nov-1950   Cancelled Treatment:    Reason Eval/Treat Not Completed: Medical issues which prohibited therapy. Chart reviewed. Treatment attempted; however, upon arrival, pt with nursing who notes pt will be going for a CT scan this afternoon to R/O DVT. Hold PT at this time until medically cleared.    Larae Grooms, PTA 08/23/2018, 3:05 PM

## 2018-08-23 NOTE — Progress Notes (Signed)
OT Cancellation Note  Patient Details Name: Amber Barnes MRN: [id number] DOB: 1950/08/16   Cancelled Treatment:    Reason Eval/Treat Not Completed: Patient at procedure or test/ unavailable. Upon attempt, pt out for chest CT to r/o PE. Will re-attempt at later date/time as pt is available and medically appropriate.   Jeni Salles, MPH, MS, OTR/L ascom [phone] 08/23/18, 4:39 PM

## 2018-08-23 NOTE — Anesthesia Postprocedure Evaluation (Signed)
Anesthesia Post Note  Patient: Shabnam Ladd  Procedure(s) Performed: TOTAL HIP ARTHROPLASTY ANTERIOR APPROACH-RIGHT CELL SAVER REQUESTED (Right Hip)  Patient location during evaluation: PACU Anesthesia Type: General Level of consciousness: awake and alert Pain management: pain level controlled Vital Signs Assessment: post-procedure vital signs reviewed and stable Respiratory status: spontaneous breathing, nonlabored ventilation, respiratory function stable and patient connected to nasal cannula oxygen Cardiovascular status: blood pressure returned to baseline and stable Postop Assessment: no apparent nausea or vomiting Anesthetic complications: no     Last Vitals:  Vitals:   08/23/18 0007 08/23/18 0801  BP: (!) 142/73 (!) 120/42  Pulse: 65 75  Resp: 18   Temp: 36.9 C 37.5 C  SpO2: 95% 95%    Last Pain:  Vitals:   08/23/18 1024  TempSrc:   PainSc: [place] Brityn Mastrogiovanni

## 2018-08-23 NOTE — Progress Notes (Signed)
Pt refused cpap for tonight, stated she just wants to wear her [place].

## 2018-08-23 NOTE — Progress Notes (Signed)
Subjective: 2 Days Post-Op Procedure(s) (LRB): TOTAL HIP ARTHROPLASTY ANTERIOR APPROACH-RIGHT CELL SAVER REQUESTED (Right) Patient reports pain as moderate. Patient is well but is hypoxic on room air. Denies any SOB or abdominal pain, does report a dry cough which was present prior to surgery. Currently on 3L of O2. Continue with PT today as tolerated, will likely need SNF upon discharge. Plan is to go Skilled nursing facility after hospital stay.  Objective: Vital signs in last 24 hours: Temp:  [98 F (36.7 C)-98.5 F (36.9 C)] 98.4 F (36.9 C) (02/13 0007) Pulse Rate:  [59-65] 65 (02/13 0007) Resp:  [16-18] 18 (02/13 0007) BP: (101-142)/(47-73) 142/73 (02/13 0007) SpO2:  [94 %-97 %] 95 % (02/13 0007)  Intake/Output from previous day: 02/12 0701 - 02/13 0700 In: 480 [P.O.:480] Out: 215 [Urine:200; Drains:15] Intake/Output this shift: No intake/output data recorded.  Recent Labs    08/22/18 0441 08/23/18 0451  HGB 11.6* 10.5*   Recent Labs    08/22/18 0441 08/23/18 0451  WBC 12.0* 9.7  RBC 3.82* 3.37*  HCT 38.5 34.3*  PLT 160 121*   Recent Labs    08/22/18 0441 08/23/18 0451  NA 139 139  K 4.4 4.2  CL 105 105  CO2 30 31  BUN 19 17  CREATININE 0.95 0.81  GLUCOSE 115* 111*  CALCIUM 8.3* 8.1*   No results for input(s): LABPT, INR in the last 72 hours.  EXAM General - Patient is Alert, Appropriate and Oriented Extremity - Neurovascular intact Sensation intact distally Intact pulses distally Dorsiflexion/Plantar flexion intact No cellulitis present Compartment soft Dressing - dressing C/D/I and no drainage.  Woundvac in place, hemovac removed this AM, 4x4 with tegaderm applied to the right leg. Motor Function - intact, moving foot and toes well on exam.   Past Medical History:  Diagnosis Date  . Achilles tendinitis   . Acute medial meniscal tear   . Allergy   . Anxiety   . Arthritis   . Atrial fibrillation ([place])    a. new onset 07/2016; b.  CHADS2VASc --> 2 (HTN, female); c. eliquis  . CHF (congestive heart failure) ([place])   . Chronic lower back pain   . COPD (chronic obstructive pulmonary disease) ([place])   . Depression   . Fibrocystic breast disease   . GERD (gastroesophageal reflux disease)   . Hyperlipidemia   . Hypertension   . Insomnia   . Lipoma   . Migraine   . Osteoarthritis    left knee  . Osteopenia   . Sleep apnea    does not use her cpap  . Tendonitis   . Thyroid disease   . Vitamin D deficiency     Assessment/Plan:   2 Days Post-Op Procedure(s) (LRB): TOTAL HIP ARTHROPLASTY ANTERIOR APPROACH-RIGHT CELL SAVER REQUESTED (Right) Active Problems:   Status post total hip replacement, right  Estimated body mass index is 53.68 kg/m as calculated from the following:   Height as of this encounter: 5\' 5"  (1.651 m).   Weight as of this encounter: 146.3 kg. Advance diet Up with therapy, partial weightbearing right lower extremity x4 weeks  Labs reviewed this AM.Hg 10.5. Upon entering the room patient was at 68% O2 on room air, placed back on 2L of O2 and will bump up to 3L until at 90%.  Chest x-ray has been ordered.  Denies any chest pain. If continued Hypoxia will possibly order CT chest. Not tachycardic, BP improved. Continue with PT at this time, continue to work on  a BM but the patient is passing gas without pain this AM. Care manager to assist with discharge to skilled nursing facility.  DVT Prophylaxis - TED hose and SCDs, Eliquis Partial weightbearing right lower extremity  J. Cameron Proud, PA-C [place] 08/23/2018, 7:58 AM

## 2018-08-23 NOTE — Consult Note (Signed)
[place] Consultation  Amber Barnes TGG:[id number] DOB: [birth year] DOA: 08/21/2018 PCP: [name], FNP   Requesting physician: [name] Date of consultation: 08/23/2018 Reason for consultation: Hypoxia and confusion  CHIEF COMPLAINT: Hypoxia and confusion  HISTORY OF PRESENT ILLNESS: Amber Barnes  is a 68 y.o. female with a known history of obesity, chronic atrial fibrillation, congestive heart failure, COPD, GERD, hyperlipidemia, hypertension, osteoarthritis, sleep apnea noncompliant with CPAP currently under orthopedic service.  Patient had a total hip arthroplasty on the right side on Tuesday.  Tolerated procedure well.  Today she complained of shortness of breath and oxygen saturation was low.  Currently she is on oxygen via nasal cannula at 2 L.  She was worked up with CTA chest which showed no pulmonary embolism.  Patient has no chest pain.  She is noncompliant with CPAP.  Says she gets more anxious whenever she uses CPAP.  She is alert and awake and responds to all verbal commands.  No fever.  PAST MEDICAL HISTORY:   Past Medical History:  Diagnosis Date  . Achilles tendinitis   . Acute medial meniscal tear   . Allergy   . Anxiety   . Arthritis   . Atrial fibrillation ([place])    a. new onset 07/2016; b. CHADS2VASc --> 2 (HTN, female); c. eliquis  . CHF (congestive heart failure) ([place])   . Chronic lower back pain   . COPD (chronic obstructive pulmonary disease) ([place])   . Depression   . Fibrocystic breast disease   . GERD (gastroesophageal reflux disease)   . Hyperlipidemia   . Hypertension   . Insomnia   . Lipoma   . Migraine   . Osteoarthritis    left knee  . Osteopenia   . Sleep apnea    does not use her cpap  . Tendonitis   . Thyroid disease   . Vitamin D deficiency     PAST SURGICAL HISTORY:  Past Surgical History:  Procedure Laterality Date  . ABDOMINAL HYSTERECTOMY     ovaries left  . ACHILLES TENDON SURGERY     2012,01/2017   . BACK SURGERY    . CARDIOVERSION N/A 09/07/2016   Procedure: CARDIOVERSION;  Surgeon: Minna Merritts, MD;  Location: [facility] ORS;  Service: Cardiovascular;  Laterality: N/A;  . CARDIOVERSION N/A 10/19/2016   Procedure: CARDIOVERSION;  Surgeon: Minna Merritts, MD;  Location: [facility] ORS;  Service: Cardiovascular;  Laterality: N/A;  . CARDIOVERSION N/A 12/14/2016   Procedure: CARDIOVERSION;  Surgeon: Minna Merritts, MD;  Location: [facility] ORS;  Service: Cardiovascular;  Laterality: N/A;  . COLONOSCOPY    . ESOPHAGOGASTRODUODENOSCOPY    . THYROIDECTOMY     thyroid lobectomy secondary to a benign nodule  . TOTAL HIP ARTHROPLASTY Right 08/21/2018   Procedure: TOTAL HIP ARTHROPLASTY ANTERIOR APPROACH-RIGHT CELL SAVER REQUESTED;  Surgeon: [name], MD;  Location: [facility] ORS;  Service: Orthopedics;  Laterality: Right;    SOCIAL HISTORY:  Social History   Tobacco Use  . Smoking status: Former Smoker    Packs/day: 0.25    Years: 10.00    Pack years: 2.50    Types: Cigarettes    Last attempt to quit: 2005    Years since quitting: 15.1  . Smokeless tobacco: Never Used  . Tobacco comment: smoked for 10 years, 1 pack every 2-3 days  Substance Use Topics  . Alcohol use: Not Currently    Comment: social, occasional    FAMILY HISTORY:  Family History  Problem Relation Age  of Onset  . Cancer Sister 58       ovarian  . Depression Sister   . Cancer Father 35       colon  . Heart disease Father   . Heart attack Father 40       died from MI  . Hyperlipidemia Father   . Hypertension Father   . Cancer Mother        leukemia  . Breast cancer Paternal Aunt        70's  . Migraines Neg Hx     DRUG ALLERGIES:  Allergies  Allergen Reactions  . Prednisone Other (See Comments)    Agitation, hyperactivity, polyphagia  . Hydrocodone-Acetaminophen Other (See Comments)    Hallucinations and combative  . Oxycodone Itching    Can take it with benadryl  . Tramadol Itching    Can take it with  benadryl  . Adhesive [Tape] Itching and Rash    Prefers paper tape  . Latex Itching and Rash    use paper tap    REVIEW OF SYSTEMS:   CONSTITUTIONAL: No fever,has fatigue and weakness.  EYES: No blurred or double vision.  EARS, NOSE, AND THROAT: No tinnitus or ear pain.  RESPIRATORY: No cough, has shortness of breath,  No wheezing or hemoptysis.  CARDIOVASCULAR: No chest pain, orthopnea, edema.  GASTROINTESTINAL: No nausea, vomiting, diarrhea or abdominal pain.  GENITOURINARY: No dysuria, hematuria.  ENDOCRINE: No polyuria, nocturia,  HEMATOLOGY: No anemia, easy bruising or bleeding SKIN: No rash or lesion. MUSCULOSKELETAL: No joint pain or arthritis.   NEUROLOGIC: No tingling, numbness, weakness.  PSYCHIATRY: No anxiety or depression.   MEDICATIONS AT HOME:  Prior to Admission medications   Medication Sig Start Date End Date Taking? Authorizing Provider  ALPRAZolam (XANAX) 0.25 MG tablet Take 1 tablet (0.25 mg total) by mouth daily as needed for anxiety. 06/04/18  Yes [name], FNP  amiodarone (PACERONE) 200 MG tablet Take 1 tablet (200 mg total) by mouth daily. 07/24/17  Yes Minna Merritts, MD  amoxicillin-clavulanate (AUGMENTIN) 875-125 MG tablet Take 1 tablet by mouth 2 (two) times daily. Patient not taking: Reported on 08/08/2018 07/25/18  Yes Arnett, Yvetta Coder, FNP  B Complex Vitamins (VITAMIN B COMPLEX PO) Take 1 tablet by mouth daily.    Yes [provider]  bisoprolol (ZEBETA) 5 MG tablet Take 0.5 tablets (2.5 mg total) by mouth daily. 07/25/18  Yes [name], FNP  calcium gluconate 500 MG tablet Take 500 tablets by mouth daily.   Yes [provider]  Cholecalciferol (VITAMIN D3) 5000 units CAPS Take 5,000 Units by mouth daily.   Yes [provider]  esomeprazole (NEXIUM) 20 MG capsule Take 20 mg by mouth daily as needed (acid reflux).   Yes [provider]  furosemide (LASIX) 20 MG tablet TAKE 1 TABLET (20 MG TOTAL)  BY MOUTH 2 (TWO) TIMES DAILY. Patient taking differently: Take 20 mg by mouth daily.  10/13/17  Yes Gollan, [name], MD  gabapentin (NEURONTIN) 300 MG capsule Take 1-3 capsules (300-900 mg total) by mouth 4 (four) times daily. 08/09/18 11/07/18 Yes Vevelyn Francois, NP  hydrALAZINE (APRESOLINE) 25 MG tablet Take 1 tablet (25 mg total) by mouth 4 (four) times daily. 04/17/18 04/17/19 Yes Earleen Newport, MD  KLOR-CON 10 10 MEQ tablet TAKE 1 TABLET BY MOUTH TWICE A DAY Patient taking differently: Take 10 mEq by mouth daily.  05/23/18  Yes Minna Merritts, MD  losartan (COZAAR) 25  MG tablet TAKE 1 TABLET BY MOUTH EVERY DAY Patient taking differently: Take 25 mg by mouth daily.  07/20/18  Yes [name], FNP  Multiple Vitamin (MULTIVITAMIN) tablet Take 1 tablet by mouth daily.   Yes [provider]  oxymetazoline (AFRIN) 0.05 % nasal spray Place 1 spray into both nostrils at bedtime as needed for congestion.    Yes [provider]  ranolazine (RANEXA) 500 MG 12 hr tablet TAKE 1 TABLET BY MOUTH TWICE A DAY 08/03/18  Yes Gollan, [name], MD  rosuvastatin (CRESTOR) 20 MG tablet TAKE 1 TABLET BY MOUTH EVERY DAY Patient taking differently: Take 20 mg by mouth daily.  05/28/18  Yes [name], FNP  sertraline (ZOLOFT) 100 MG tablet TAKE 1 TABLET BY MOUTH EVERY DAY 08/16/18  Yes Arnett, Yvetta Coder, FNP  ELIQUIS 5 MG TABS tablet TAKE 1 TABLET BY MOUTH TWICE A DAY 08/20/18   Gollan, [name], MD  rizatriptan (MAXALT) 10 MG tablet TAKE 1 TABLET (10 MG TOTAL) BY MOUTH AS NEEDED FOR MIGRAINE. MAY REPEAT IN 2 HOURS IF NEEDED Patient not taking: Reported on 08/21/2018 03/13/18   Melvenia Beam, MD      PHYSICAL EXAMINATION:   VITAL SIGNS: Blood pressure (!) 108/48, pulse 67, temperature 99.9 F (37.7 C), temperature source Oral, resp. rate 18, height 5\' 5"  (1.651 m), weight (!) 146.3 kg, SpO2 96 %.  GENERAL:  68 y.o.-year-old morbidly obese patient lying in the bed with no  acute distress.  EYES: Pupils equal, round, reactive to light and accommodation. No scleral icterus. Extraocular muscles intact.  HEENT: Head atraumatic, normocephalic. Oropharynx and nasopharynx clear.  NECK:  Supple, no jugular venous distention. No thyroid enlargement, no tenderness.  LUNGS: Decreased breath sounds bilaterally, no wheezing, rales,rhonchi or crepitation. No use of accessory muscles of respiration.  CARDIOVASCULAR: S1, S2 normal. No murmurs, rubs, or gallops.  ABDOMEN: Soft, nontender, nondistended. Bowel sounds present. No organomegaly or mass.  EXTREMITIES: No pedal edema, cyanosis, or clubbing.  Right hip bandage noted NEUROLOGIC: Cranial nerves II through XII are intact. Muscle strength 5/5 in all extremities. Sensation intact. Gait not checked.  PSYCHIATRIC: The patient is alert and oriented x 3.  SKIN: No obvious rash, lesion, or ulcer.   LABORATORY PANEL:   CBC Recent Labs  Lab 08/22/18 0441 08/23/18 0451  WBC 12.0* 9.7  HGB 11.6* 10.5*  HCT 38.5 34.3*  PLT 160 121*  MCV 100.8* 101.8*  MCH 30.4 31.2  MCHC 30.1 30.6  RDW 13.3 13.2   ------------------------------------------------------------------------------------------------------------------  Chemistries  Recent Labs  Lab 08/22/18 0441 08/23/18 0451  NA 139 139  K 4.4 4.2  CL 105 105  CO2 30 31  GLUCOSE 115* 111*  BUN 19 17  CREATININE 0.95 0.81  CALCIUM 8.3* 8.1*   ------------------------------------------------------------------------------------------------------------------ estimated creatinine clearance is 101.3 mL/min (by C-G formula based on SCr of 0.81 mg/dL). ------------------------------------------------------------------------------------------------------------------ No results for input(s): TSH, T4TOTAL, T3FREE, THYROIDAB in the last 72 hours.  Invalid input(s): FREET3   Coagulation profile No results for input(s): INR, PROTIME in the last 168  hours. ------------------------------------------------------------------------------------------------------------------- No results for input(s): DDIMER in the last 72 hours. -------------------------------------------------------------------------------------------------------------------  Cardiac Enzymes No results for input(s): CKMB, TROPONINI, MYOGLOBIN in the last 168 hours.  Invalid input(s): CK ------------------------------------------------------------------------------------------------------------------ Invalid input(s): POCBNP  ---------------------------------------------------------------------------------------------------------------  Urinalysis    Component Value Date/Time   COLORURINE AMBER (A) 08/08/2018 0932   APPEARANCEUR HAZY (A) 08/08/2018 0932   APPEARANCEUR Clear 10/10/2014 2137   LABSPEC 1.015  08/08/2018 0932   LABSPEC 1.006 10/10/2014 2137   PHURINE 5.0 08/08/2018 0932   GLUCOSEU NEGATIVE 08/08/2018 0932   GLUCOSEU Negative 10/10/2014 2137   HGBUR NEGATIVE 08/08/2018 0932   BILIRUBINUR NEGATIVE 08/08/2018 0932   BILIRUBINUR neg 05/17/2016 1408   BILIRUBINUR Negative 10/10/2014 2137   KETONESUR NEGATIVE 08/08/2018 0932   PROTEINUR NEGATIVE 08/08/2018 0932   UROBILINOGEN 0.2 05/17/2016 1408   NITRITE NEGATIVE 08/08/2018 0932   LEUKOCYTESUR NEGATIVE 08/08/2018 0932   LEUKOCYTESUR Negative 10/10/2014 2137     RADIOLOGY: Ct Angio Chest Pe W Or Wo Contrast  Result Date: 08/23/2018 CLINICAL DATA:  Shortness of breath.  Right hip surgery 2 days ago. EXAM: CT ANGIOGRAPHY CHEST WITH CONTRAST TECHNIQUE: Multidetector CT imaging of the chest was performed using the standard protocol during bolus administration of intravenous contrast. Multiplanar CT image reconstructions and MIPs were obtained to evaluate the vascular anatomy. CONTRAST:  33mL OMNIPAQUE IOHEXOL 350 MG/ML SOLN COMPARISON:  Chest radiograph 08/23/2018 FINDINGS: Cardiovascular: Satisfactory  opacification of the pulmonary arteries to the lobar level. The segmental arteries are suboptimally visualized. No evidence of central pulmonary embolism. Normal heart size. No pericardial effusion. Mediastinum/Nodes: No enlarged mediastinal, hilar, or axillary lymph nodes. Thyroid gland, trachea, and esophagus demonstrate no significant findings. Lungs/Pleura: Atelectatic changes in bilateral lung bases and lingula. Upper Abdomen: No acute abnormality. Musculoskeletal: No chest wall abnormality. No acute or significant osseous findings. Review of the MIP images confirms the above findings. IMPRESSION: No evidence of central pulmonary embolus. The segmental arterial branches are suboptimally visualized. Atelectatic changes in the bilateral lung bases and lingula. Electronically Signed   By: Fidela Salisbury M.D.   On: 08/23/2018 16:45   Dg Chest Port 1 View  Result Date: 08/23/2018 CLINICAL DATA:  Hypoxia HTN, COPD, CHF, afib, Former smoker x 15 years ago EXAM: PORTABLE CHEST 1 VIEW COMPARISON:  04/08/2018 FINDINGS: Enlarged cardiac silhouette. Low lung volumes. Normal pulmonary vasculature. No effusion, infiltrate or pneumothorax. IMPRESSION: No acute cardiopulmonary process. Electronically Signed   By: Suzy Bouchard M.D.   On: 08/23/2018 10:04    EKG: Orders placed or performed during the hospital encounter of 04/17/18  . ED EKG  . ED EKG  . EKG    IMPRESSION AND PLAN: 68 year old obese female patient with a known history of obesity, chronic atrial fibrillation, congestive heart failure, COPD, GERD, hyperlipidemia, hypertension, osteoarthritis, sleep apnea noncompliant with CPAP currently under orthopedic service consulted for hypoxia and confusion.    -Obesity hypoventilation syndrome Weight loss counseling Oxygen via nasal cannula for now Incentive spirometry  -Obstructive sleep apnea Patient needs to use CPAP at bedtime Explained to patient in detail the compliance of wearing the  machine  -Lethargy and altered mental status secondary to hypoxia from sleep apnea Check urinalysis  -Status post right hip arthroplasty Orthopedic follow-up Physical therapy to continue the regimen  -Chronic atrial fibrillation Continue amiodarone for rate control Continue Eliquis for anticoagulation  -DVT prophylaxis On Eliquis for anticoagulation  -Thank you for consultation we will follow the patient  All the records are reviewed and case discussed with ED provider. Management plans discussed with the patient, family and they are in agreement.  CODE STATUS:Full code    Code Status Orders  (From admission, onward)         Start     Ordered   08/21/18 1325  Full code  Continuous     08/21/18 1324        Code Status History    Date Active  Date Inactive Code Status Order ID Comments User Context   11/28/2016 0124 11/29/2016 2117 Full Code [id number]  Lance Coon, MD Inpatient   08/11/2016 0036 08/11/2016 2059 Full Code [id number]  Harvie Bridge, DO Inpatient   07/27/2016 1525 07/29/2016 2033 Full Code [id number]  Henreitta Leber, MD Inpatient    Advance Directive Documentation     Most Recent Value  Type of Advance Directive  Healthcare Power of Attorney, Living will  Pre-existing out of facility DNR order (yellow form or pink MOST form)  -  "MOST" Form in Place?  -       TOTAL TIME TAKING CARE OF THIS PATIENT: 53 minutes.    Saundra Shelling M.D on 08/23/2018 at 5:43 PM  Between 7am to 6pm - Pager - [phone]  After 6pm go to [email or web address] - password [email or web address]  [facility] Office  [phone]  CC: Primary care physician; [name], FNP

## 2018-08-23 NOTE — Progress Notes (Signed)
Physical Therapy Treatment Patient Details Name: Amber Barnes MRN: [id number] DOB: [birth year] Today's Date: 08/23/2018    History of Present Illness admitted for acute hospitalization status post R THA, anterior approach (08/21/2018), PWB.    PT Comments    Discussed with RN prior to session.  OK for supine exercises but requested no out of bed this am due to lethargy.  Pt awake upon entering but needed max verbal cues to stay awake.  Tolerated minimal supine exercises with little active assist noted.  Sats 93% on 3 lpm.   Continue as appropriate.   Follow Up Recommendations  SNF     Equipment Recommendations       Recommendations for Other Services       Precautions / Restrictions Precautions Precautions: Fall;Anterior Hip Precaution Booklet Issued: No Precaution Comments: R hip Restrictions Weight Bearing Restrictions: Yes RLE Weight Bearing: Partial weight bearing RLE Partial Weight Bearing Percentage or Pounds: 50    Mobility  Bed Mobility               General bed mobility comments: deferred per RN request and pt too lethargic to participate.  Transfers                    Ambulation/Gait                 Stairs             Wheelchair Mobility    Modified Rankin (Stroke Patients Only)       Balance                                            Cognition Arousal/Alertness: Lethargic Behavior During Therapy: WFL for tasks assessed/performed Overall Cognitive Status: Within Functional Limits for tasks assessed                                 General Comments: Max verbal cues to stay awake.  RN reports some confusion this am but none demonstrated during session.      Exercises Other Exercises Other Exercises: supine P/AAROM ankle pumps, heel slides, ab/add and SLR - minimal participation from pt due to lethargy.    General Comments        Pertinent Vitals/Pain Pain Assessment:  Faces Faces Pain Scale: Hurts even more Pain Location: L hip Pain Descriptors / Indicators: Aching;Guarding;Grimacing Pain Intervention(s): Limited activity within patient's tolerance;Monitored during session;Repositioned    Home Living                      Prior Function            PT Goals (current goals can now be found in the care plan section) Progress towards PT goals: Not progressing toward goals - comment    Frequency    BID      PT Plan Current plan remains appropriate    Co-evaluation              AM-PAC PT "6 Clicks" Mobility   Outcome Measure  Help needed turning from your back to your side while in a flat bed without using bedrails?: Total Help needed moving from lying on your back to sitting on the side of a flat bed without using bedrails?: Total Help needed moving to and from  a bed to a chair (including a wheelchair)?: Total Help needed standing up from a chair using your arms (e.g., wheelchair or bedside chair)?: Total Help needed to walk in hospital room?: Total Help needed climbing 3-5 steps with a railing? : Total 6 Click Score: 6    End of Session Equipment Utilized During Treatment: Gait belt;Oxygen Activity Tolerance: Patient limited by lethargy Patient left: in bed;with call bell/phone within reach;with bed alarm set;with family/visitor present Nurse Communication: Mobility status Pain - Right/Left: Right Pain - part of body: Hip     Time: 1700-1749 PT Time Calculation (min) (ACUTE ONLY): 10 min  Charges:  $Therapeutic Exercise: 8-22 mins                     Chesley Noon, PTA 08/23/18, 11:16 AM

## 2018-08-23 NOTE — Anesthesia Preprocedure Evaluation (Addendum)
Anesthesia Evaluation  Patient identified by MRN, date of birth, ID band Patient awake    Reviewed: Allergy & Precautions, H&P , NPO status , Patient's Chart, lab work & pertinent test results, reviewed documented beta blocker date and time   Airway Mallampati: II  TM Distance: >3 FB Neck ROM: full    Dental  (+) Teeth Intact   Pulmonary sleep apnea , COPD, former smoker,    Pulmonary exam normal        Cardiovascular Exercise Tolerance: Poor hypertension, On Medications +CHF  Normal cardiovascular exam Rhythm:regular Rate:Normal     Neuro/Psych  Headaches, PSYCHIATRIC DISORDERS Anxiety Depression  Neuromuscular disease    GI/Hepatic Neg liver ROS, GERD  ,  Endo/Other  negative endocrine ROS  Renal/GU negative Renal ROS  negative genitourinary   Musculoskeletal   Abdominal   Peds  Hematology negative hematology ROS (+)   Anesthesia Other Findings Past Medical History: No date: Achilles tendinitis No date: Acute medial meniscal tear No date: Allergy No date: Anxiety No date: Arthritis No date: Atrial fibrillation [facility])     Comment:  a. new onset 07/2016; b. CHADS2VASc --> 2 (HTN, female);               c. eliquis No date: CHF (congestive heart failure) ([place]) No date: Chronic lower back pain No date: COPD (chronic obstructive pulmonary disease) ([place]) No date: Depression No date: Fibrocystic breast disease No date: GERD (gastroesophageal reflux disease) No date: Hyperlipidemia No date: Hypertension No date: Insomnia No date: Lipoma No date: Migraine No date: Osteoarthritis     Comment:  left knee No date: Osteopenia No date: Sleep apnea     Comment:  does not use her cpap No date: Tendonitis No date: Thyroid disease No date: Vitamin D deficiency Past Surgical History: No date: ABDOMINAL HYSTERECTOMY     Comment:  ovaries left No date: ACHILLES TENDON SURGERY     Comment:  2012,01/2017 No date: BACK  SURGERY 09/07/2016: CARDIOVERSION; N/A     Comment:  Procedure: CARDIOVERSION;  Surgeon: Minna Merritts,               MD;  Location: [facility] ORS;  Service: Cardiovascular;                Laterality: N/A; 10/19/2016: CARDIOVERSION; N/A     Comment:  Procedure: CARDIOVERSION;  Surgeon: Minna Merritts,               MD;  Location: [facility] ORS;  Service: Cardiovascular;                Laterality: N/A; 12/14/2016: CARDIOVERSION; N/A     Comment:  Procedure: CARDIOVERSION;  Surgeon: Minna Merritts,               MD;  Location: [facility] ORS;  Service: Cardiovascular;                Laterality: N/A; No date: COLONOSCOPY No date: ESOPHAGOGASTRODUODENOSCOPY No date: THYROIDECTOMY     Comment:  thyroid lobectomy secondary to a benign nodule 08/21/2018: TOTAL HIP ARTHROPLASTY; Right     Comment:  Procedure: TOTAL HIP ARTHROPLASTY ANTERIOR               APPROACH-RIGHT CELL SAVER REQUESTED;  Surgeon: [name], MD;  Location: [facility] ORS;  Service: Orthopedics;               Laterality:  Right; BMI    Body Mass Index:  53.68 kg/m     Reproductive/Obstetrics negative OB ROS                            Anesthesia Physical Anesthesia Plan  ASA: III  Anesthesia Plan: General ETT   Post-op Pain Management:    Induction:   PONV Risk Score and Plan:   Airway Management Planned:   Additional Equipment:   Intra-op Plan:   Post-operative Plan:   Informed Consent: I have reviewed the patients History and Physical, chart, labs and discussed the procedure including the risks, benefits and alternatives for the proposed anesthesia with the patient or authorized representative who has indicated his/her understanding and acceptance.     Dental Advisory Given  Plan Discussed with: CRNA  Anesthesia Plan Comments:         Anesthesia Quick Evaluation

## 2018-08-24 LAB — BASIC METABOLIC PANEL
Anion gap: 5 (ref 5–15)
BUN: 14 mg/dL (ref 8–23)
CO2: 32 mmol/L (ref 22–32)
Calcium: 8.4 mg/dL — ABNORMAL LOW (ref 8.9–10.3)
Chloride: 104 mmol/L (ref 98–111)
Creatinine, Ser: 0.61 mg/dL (ref 0.44–1.00)
GFR calc Af Amer: >60 mL/min (ref >60)
GFR calc non Af Amer: >60 mL/min (ref >60)
Glucose, Bld: 109 mg/dL — ABNORMAL HIGH (ref 70–99)
Potassium: 4.3 mmol/L (ref 3.5–5.1)
Sodium: 141 mmol/L (ref 135–145)

## 2018-08-24 LAB — CBC
HCT: 34.4 % — ABNORMAL LOW (ref 36.0–46.0)
Hemoglobin: 10.6 g/dL — ABNORMAL LOW (ref 12.0–15.0)
MCH: 30.5 pg (ref 26.0–34.0)
MCHC: 30.8 g/dL (ref 30.0–36.0)
MCV: 98.9 fL (ref 80.0–100.0)
Platelets: 117 10*3/uL — ABNORMAL LOW (ref 150–400)
RBC: 3.48 MIL/uL — ABNORMAL LOW (ref 3.87–5.11)
RDW: 12.8 % (ref 11.5–15.5)
WBC: 7.7 10*3/uL (ref 4.0–10.5)
nRBC: 0 % (ref 0.0–0.2)

## 2018-08-24 LAB — BLOOD GAS, ARTERIAL
Acid-Base Excess: 6.6 mmol/L — ABNORMAL HIGH (ref 0.0–2.0)
Bicarbonate: 31.9 mmol/L — ABNORMAL HIGH (ref 20.0–28.0)
FIO2: 0.32
O2 Saturation: 97.3 %
Patient temperature: 37
pCO2 arterial: 47 mmHg (ref 32.0–48.0)
pH, Arterial: 7.44 (ref 7.350–7.450)
pO2, Arterial: 91 mmHg (ref 83.0–108.0)

## 2018-08-24 MED ORDER — GABAPENTIN 300 MG PO CAPS
300.0000 mg | ORAL_CAPSULE | Freq: Three times a day (TID) | ORAL | Status: DC
  Administered 2018-08-24 – 2018-08-25 (×3): 300 mg via ORAL
  Filled 2018-08-24 (×3): qty 1

## 2018-08-24 MED ORDER — METHOCARBAMOL 500 MG PO TABS: 500.0000 mg | ORAL_TABLET | Freq: Four times a day (QID) | ORAL | 0 refills | Status: DC | PRN

## 2018-08-24 NOTE — Care Management Important Message (Signed)
Important Message  Patient Details  Name: Amber Barnes MRN: [id number] Date of Birth: 23-Jun-1951   Medicare Important Message Given:  Yes    Juliann Pulse A Khush Pasion 08/24/2018, 10:38 AM

## 2018-08-24 NOTE — Progress Notes (Signed)
PT Cancellation Note  Patient Details Name: Amber Barnes MRN: [id number] DOB: [birth year]   Cancelled Treatment:    Reason Eval/Treat Not Completed: Patient's level of consciousness.  Pt with daughter in law at bedside who reports pt presenting with increased confusion/disorientation this afternoon.  Pt lethargic not opening eyes during the session and actively resisting multiple attempts to gently mobilize her LEs calling out "he's trying to hurt me".  Pt also consistently attempting to remove her [place].  Nursing entered room to assess pt and notified of the above.  Will attempt to see pt at a future date as appropriate.     Linus Salmons PT, DPT 08/24/18, 2:31 PM

## 2018-08-24 NOTE — Progress Notes (Signed)
[place] [facility] [place] NAME: Amber Barnes    MR#:  [id number]  DATE OF BIRTH:  03-Nov-1950  SUBJECTIVE:  CHIEF COMPLAINT:  No chief complaint on file.  The patient is confused this morning.  She is on oxygen 3 L by nasal cannula. REVIEW OF SYSTEMS:  Review of Systems  Constitutional: Negative for chills, fever and malaise/fatigue.  HENT: Negative for sore throat.   Eyes: Negative for blurred vision and double vision.  Respiratory: Negative for cough, hemoptysis, shortness of breath, wheezing and stridor.   Cardiovascular: Negative for chest pain, palpitations, orthopnea and leg swelling.  Gastrointestinal: Negative for abdominal pain, blood in stool, diarrhea, melena, nausea and vomiting.  Genitourinary: Negative for dysuria, flank pain and hematuria.  Musculoskeletal: Negative for back pain and joint pain.  Neurological: Negative for dizziness, sensory change, focal weakness, seizures, loss of consciousness, weakness and headaches.  Endo/Heme/Allergies: Negative for polydipsia.  Psychiatric/Behavioral: Negative for depression. The patient is not nervous/anxious.     DRUG ALLERGIES:   Allergies  Allergen Reactions  . Prednisone Other (See Comments)    Agitation, hyperactivity, polyphagia  . Hydrocodone-Acetaminophen Other (See Comments)    Hallucinations and combative  . Oxycodone Itching    Can take it with benadryl  . Tramadol Itching    Can take it with benadryl  . Adhesive [Tape] Itching and Rash    Prefers paper tape  . Latex Itching and Rash    use paper tap   VITALS:  Blood pressure (!) 95/46, pulse 68, temperature 98.1 F (36.7 C), temperature source Oral, resp. rate 16, height 5\' 5"  (1.651 m), weight (!) 146.3 kg, SpO2 94 %. PHYSICAL EXAMINATION:  Physical Exam Constitutional:      General: She is not in acute distress.    Comments: Morbid obesity.  HENT:     Head: Normocephalic.     Nose: No congestion.   Eyes:     General: No scleral icterus.    Conjunctiva/sclera: Conjunctivae normal.     Pupils: Pupils are equal, round, and reactive to light.  Neck:     Musculoskeletal: Normal range of motion and neck supple.     Vascular: No JVD.     Trachea: No tracheal deviation.  Cardiovascular:     Rate and Rhythm: Normal rate and regular rhythm.     Heart sounds: Normal heart sounds. No murmur. No gallop.   Pulmonary:     Effort: Pulmonary effort is normal. No respiratory distress.     Breath sounds: Normal breath sounds. No wheezing or rales.  Abdominal:     General: Bowel sounds are normal. There is no distension.     Palpations: Abdomen is soft.     Tenderness: There is no abdominal tenderness. There is no rebound.  Musculoskeletal: Normal range of motion.        General: No tenderness.  Skin:    Findings: No erythema or rash.  Neurological:     General: No focal deficit present.     Mental Status: She is alert.     Cranial Nerves: No cranial nerve deficit.     Comments: AAOx2  Psychiatric:     Comments: Confused this morning    LABORATORY PANEL:  Female CBC Recent Labs  Lab 08/24/18 0452  WBC 7.7  HGB 10.6*  HCT 34.4*  PLT 117*   ------------------------------------------------------------------------------------------------------------------ Chemistries  Recent Labs  Lab 08/24/18 0452  NA 141  K 4.3  CL 104  CO2 32  GLUCOSE 109*  BUN 14  CREATININE 0.61  CALCIUM 8.4*   RADIOLOGY:  Ct Angio Chest Pe W Or Wo Contrast  Result Date: 08/23/2018 CLINICAL DATA:  Shortness of breath.  Right hip surgery 2 days ago. EXAM: CT ANGIOGRAPHY CHEST WITH CONTRAST TECHNIQUE: Multidetector CT imaging of the chest was performed using the standard protocol during bolus administration of intravenous contrast. Multiplanar CT image reconstructions and MIPs were obtained to evaluate the vascular anatomy. CONTRAST:  8mL OMNIPAQUE IOHEXOL 350 MG/ML SOLN COMPARISON:  Chest radiograph  08/23/2018 FINDINGS: Cardiovascular: Satisfactory opacification of the pulmonary arteries to the lobar level. The segmental arteries are suboptimally visualized. No evidence of central pulmonary embolism. Normal heart size. No pericardial effusion. Mediastinum/Nodes: No enlarged mediastinal, hilar, or axillary lymph nodes. Thyroid gland, trachea, and esophagus demonstrate no significant findings. Lungs/Pleura: Atelectatic changes in bilateral lung bases and lingula. Upper Abdomen: No acute abnormality. Musculoskeletal: No chest wall abnormality. No acute or significant osseous findings. Review of the MIP images confirms the above findings. IMPRESSION: No evidence of central pulmonary embolus. The segmental arterial branches are suboptimally visualized. Atelectatic changes in the bilateral lung bases and lingula. Electronically Signed   By: Fidela Salisbury M.D.   On: 08/23/2018 16:45   ASSESSMENT AND PLAN:  68 year old obese female patient with a known history of obesity, chronic atrial fibrillation, congestive heart failure, COPD, GERD, hyperlipidemia, hypertension, osteoarthritis, sleep apnea noncompliant with CPAP currently under orthopedic service consulted for hypoxia and confusion.    Morbid obesity with hypoventilation syndrome Weight loss counseled. Try to wean down oxygen via nasal cannula. Incentive spirometry.  -Obstructive sleep apnea Patient is not compliance to CPAP at night, she needs to use CPAP at bedtime Explained to patient in detail the compliance of wearing the machine.  -Lethargy and altered mental status secondary to hypoxia from sleep apnea Better.  COPD.  Stable nebulizer PRN. Chronic diastolic CHF, LV EF: 29% -   60%.  Stable.  Hold Lasix due to softer blood pressure.  -Status post right hip arthroplasty Orthopedic follow-up Physical therapy to continue the regimen  -Chronic atrial fibrillation Continue amiodarone for rate control Continue Eliquis for  anticoagulation  -DVT prophylaxis On Eliquis for anticoagulation  Hypertension.  Hold hypertension medication due to softer blood pressure.  All the records are reviewed and case discussed with Care Management/Social Worker. Management plans discussed with the patient, her daughter-in-law and they are in agreement.  CODE STATUS: Full Code  TOTAL TIME TAKING CARE OF THIS PATIENT: 33 minutes.   More than 50% of the time was spent in counseling/coordination of care: YES  POSSIBLE D/C IN 2 DAYS, DEPENDING ON CLINICAL CONDITION.   Demetrios Loll M.D on 08/24/2018 at 2:06 PM  Between 7am to 6pm - Pager - [phone]  After 6pm go to [email or web address] - [facility]

## 2018-08-24 NOTE — Progress Notes (Signed)
Physical Therapy Treatment Patient Details Name: Amber Barnes MRN: [id number] DOB: 1950/11/18 Today's Date: 08/24/2018    History of Present Illness admitted for acute hospitalization status post R THA, anterior approach (08/21/2018), PWB.    PT Comments    Pt presents with deficits in strength, transfers, mobility, gait, balance, and activity tolerance. Pt found upon entering room with nasal canula removed with SpO2 measured on room air at 85%, nursing notified.  Pt immediately put back on her 3LO2/min with SpO2 increasing back to 94-95% in less than 30 sec.  Pt confused during the session stating PT had entered room "To hurt me" and thinking today was New Year's Day.  Pt fell asleep frequently during the session and required frequent cues to remain awake and to participate with below therex.  Pt will benefit from PT services in a SNF setting upon discharge to safely address above deficits for decreased caregiver assistance and eventual return to PLOF.    Follow Up Recommendations  SNF     Equipment Recommendations  Other (comment)(TBD at next venue of care)    Recommendations for Other Services       Precautions / Restrictions Precautions Precautions: Fall;Anterior Hip Restrictions Weight Bearing Restrictions: Yes RLE Weight Bearing: Partial weight bearing RLE Partial Weight Bearing Percentage or Pounds: 50    Mobility  Bed Mobility               General bed mobility comments: NT secondary to pt's confusion and lethargy  Transfers                 General transfer comment: Unable/unsafe to attempt  Ambulation/Gait             General Gait Details: Unable/unsafe to attempt   Stairs             Wheelchair Mobility    Modified Rankin (Stroke Patients Only)       Balance                                            Cognition Arousal/Alertness: Lethargic Behavior During Therapy: Flat affect Overall Cognitive Status:  No family/caregiver present to determine baseline cognitive functioning                                 General Comments: Pt lethargic and confused stating today was "New Year's Day"      Exercises Total Joint Exercises Ankle Circles/Pumps: AROM;AAROM;Both;5 reps;10 reps Quad Sets: Strengthening;Left;10 reps Short Arc QuadSinclair Ship;Both;10 reps;5 reps Heel Slides: AAROM;Left;10 reps Hip ABduction/ADduction: PROM;AAROM;Both;10 reps;5 reps Straight Leg Raises: PROM;AAROM;Both;10 reps;5 reps    General Comments        Pertinent Vitals/Pain Pain Assessment: 0-10 Pain Score: 10-Worst pain ever Pain Location: L hip Pain Descriptors / Indicators: Aching;Sore Pain Intervention(s): Monitored during session;Limited activity within patient's tolerance;Patient requesting pain meds-RN notified    Home Living                      Prior Function            PT Goals (current goals can now be found in the care plan section) Progress towards PT goals: Not progressing toward goals - comment(limited by confusion and lethargy)    Frequency    BID  PT Plan Current plan remains appropriate    Co-evaluation              AM-PAC PT "6 Clicks" Mobility   Outcome Measure  Help needed turning from your back to your side while in a flat bed without using bedrails?: Total Help needed moving from lying on your back to sitting on the side of a flat bed without using bedrails?: Total Help needed moving to and from a bed to a chair (including a wheelchair)?: Total Help needed standing up from a chair using your arms (e.g., wheelchair or bedside chair)?: Total Help needed to walk in hospital room?: Total Help needed climbing 3-5 steps with a railing? : Total 6 Click Score: 6    End of Session Equipment Utilized During Treatment: Oxygen Activity Tolerance: Patient limited by lethargy;Patient limited by fatigue Patient left: in bed;with call bell/phone within  reach;with bed alarm set Nurse Communication: Mobility status;Other (comment)(Pt removed nasal canula, O2 85% on room air quickly back to 94% on 3LO2/min) PT Visit Diagnosis: Muscle weakness (generalized) (M62.81);Difficulty in walking, not elsewhere classified (R26.2);Pain Pain - Right/Left: Right Pain - part of body: Hip     Time: 8185-9093 PT Time Calculation (min) (ACUTE ONLY): 16 min  Charges:  $Therapeutic Exercise: 8-22 mins                     D. Scott Oluchi Pucci PT, DPT 08/24/18, 10:19 AM

## 2018-08-24 NOTE — Progress Notes (Signed)
OT Cancellation Note  Patient Details Name: Amber Barnes MRN: [id number] DOB: 10/16/1950   Cancelled Treatment:    Reason Eval/Treat Not Completed: Medical issues which prohibited therapy. Pt with increased confusion/disorientation this afternoon and lethargic. Will re-attempt OT tx next date as pt is more appropriate and able to participate.   Jeni Salles, MPH, MS, OTR/L ascom [phone] 08/24/18, 3:41 PM

## 2018-08-24 NOTE — Progress Notes (Signed)
Plan is for patient to D/C to [facility] over the weekend to room 511 if medically stable. Per [facility] admissions coordinator at [facility] SNF authorization has been received and patient can come this weekend. Clinical Education officer, museum (CSW) sent D/C summary to [facility] via [place] today. Patient's daughter in law Helene Kelp has agreed to bring patient's c-pap from home to [facility] when patient discharges. Patient and her daughter in law Helene Kelp are aware of above.   [employer], LCSW [phone]

## 2018-08-24 NOTE — Discharge Summary (Signed)
Physician Discharge Summary  Patient ID: Ariah Mower MRN: [id number] DOB/AGE: May 05, 1951 68 y.o.  Admit date: 08/21/2018 Discharge date:  Admission Diagnoses:  PRIMARY OSTEOARTHRITIS RIGHT HIP   Discharge Diagnoses: Patient Active Problem List   Diagnosis Date Noted  . Status post total hip replacement, right 08/21/2018  . Acute non-recurrent maxillary sinusitis 07/25/2018  . BMI 50.0-59.9, adult ([place]) 07/16/2018  . Chronic sacroiliac joint pain (Right) 06/21/2018  . Leg numbness (Right) 06/19/2018  . Leg weakness (Right) 06/19/2018  . Back pain with right-sided sciatica 06/19/2018  . Headache 06/06/2018  . Morbid obesity with BMI of 50.0-59.9, adult ([place]) 05/31/2018  . Neurogenic pain 05/21/2018  . Chronic low back pain (Fourth Area of Pain) (Bilateral) (R>L) 05/03/2018  . Abnormal MRI, lumbar spine 05/03/2018  . Lumbar facet arthropathy (Bilateral) (L>R) 05/03/2018  . Lumbar lateral recess stenosis (L3-4, L4-5) (Left) 05/03/2018  . Lumbar foraminal stenosis (L4-5) (Left) 05/03/2018  . Lumbar central spinal stenosis (L4-5) 05/03/2018  . Latex precautions, history of latex allergy 05/03/2018  . History of lumbar laminectomy 04/25/2018  . Failed back surgical syndrome 04/25/2018  . Osteoarthritis of hip (Right) 04/05/2018  . History of allergy to latex 04/05/2018  . Spondylosis without myelopathy or radiculopathy, lumbosacral region 03/27/2018  . Other specified dorsopathies, sacral and sacrococcygeal region 03/27/2018  . Lumbar facet syndrome (Bilateral) (R>L) 03/21/2018  . Chronic sacroiliac joint pain (Left) 03/21/2018  . Chronic anticoagulation (Eliquis) 03/21/2018  . Chronic groin pain (Primary Area of Pain) (Right) 02/27/2018  . Chronic lower extremity pain (Secondary Area of Pain) (Right) 02/27/2018  . Chronic hip pain [facility] Area of Pain) (Right) 02/27/2018  . Chronic low back pain (Fourth Area of Pain) (Bilateral) (R>L) w/ sciatica (Right) 02/27/2018  .  Chronic knee pain (Bilateral) 02/27/2018  . Chronic pain syndrome 02/27/2018  . Pharmacologic therapy 02/27/2018  . Disorder of skeletal system 02/27/2018  . Problems influencing health status 02/27/2018  . Screening mammogram, encounter for 02/02/2018  . Numbness and tingling 12/08/2017  . Olecranon bursitis of elbow (Left) 11/14/2017  . Cervical radiculopathy 11/14/2017  . Atherosclerosis of aorta ([place]) 08/25/2017  . Migraine with aura and without status migrainosus, not intractable 06/27/2017  . Bradycardia 05/22/2017  . Restless leg 04/13/2017  . OSA (obstructive sleep apnea) 01/09/2017  . Acute pain of left shoulder 11/28/2016  . Persistent atrial fibrillation 08/11/2016  . Acute pulmonary edema (HCC)   . Hypokalemia 07/28/2016  . Chronic foot pain (Left) 07/28/2016  . Atypical chest pain   . Fatty liver 06/30/2016  . Dizziness 06/08/2016  . Family history of ovarian cancer 06/08/2016  . GERD (gastroesophageal reflux disease) 05/05/2016  . Seasonal rhinitis 05/05/2016  . Routine physical examination 05/05/2016  . Heel spur 11/05/2015  . Tendonitis 11/05/2015  . SOB (shortness of breath) 11/11/2014  . Morbid obesity ([place]) 11/11/2014  . Mixed hyperlipidemia 11/11/2014  . Essential hypertension 11/11/2014  . Anxiety and depression 11/11/2014  . Arthritis, senescent 11/11/2014    Past Medical History:  Diagnosis Date  . Achilles tendinitis   . Acute medial meniscal tear   . Allergy   . Anxiety   . Arthritis   . Atrial fibrillation ([place])    a. new onset 07/2016; b. CHADS2VASc --> 2 (HTN, female); c. eliquis  . CHF (congestive heart failure) ([place])   . Chronic lower back pain   . COPD (chronic obstructive pulmonary disease) ([place])   . Depression   . Fibrocystic breast disease   . GERD (gastroesophageal reflux disease)   .  Hyperlipidemia   . Hypertension   . Insomnia   . Lipoma   . Migraine   . Osteoarthritis    left knee  . Osteopenia   . Sleep apnea    does  not use her cpap  . Tendonitis   . Thyroid disease   . Vitamin D deficiency      Transfusion:none   Consultants (if any):   Discharged Condition: Improved  Hospital Course: Everette Dimauro is an 68 y.o. female who was admitted 08/21/2018 with a diagnosis of <principal problem not specified> and went to the operating room on 08/21/2018 and underwent the above named procedures.    Surgeries: Procedure(s): TOTAL HIP ARTHROPLASTY ANTERIOR APPROACH-RIGHT CELL SAVER REQUESTED on 08/21/2018 Patient tolerated the surgery well. Taken to PACU where she was stabilized and then transferred to the orthopedic floor.  Started on Eliquis. Foot pumps applied bilaterally at 80 mm. Heels elevated on bed with rolled towels. No evidence of DVT. Negative [place]. Physical therapy started on day #1 for gait training and transfer. OT started day #1 for ADL and assisted devices.  Patient's foley was d/c on day #1.  On postop day 1 patient noted to be hypoxic with oxygen saturations in the upper 80s.  Chest x-ray negative.  CT angiogram obtained showing no PE.  Medicine consulted and altered mental status and hypoxia thought to be due to sleep apnea and patient not using her CPAP machine.  Vital signs stable on 3 L of oxygen via nasal cannula.  Narcotics held.  Patient stable on postop day 2.    Implants:  Medacta AMIS 1 stem standard, S metal 28 mm head, 54 mm Mpact DM cup and liner  She was given perioperative antibiotics:  Anti-infectives (From admission, onward)   Start     Dose/Rate Route Frequency Ordered Stop   08/21/18 1600  ceFAZolin (ANCEF) 3 g in dextrose 5 % 50 mL IVPB     3 g 100 mL/hr over 30 Minutes Intravenous Every 6 hours 08/21/18 1324 08/22/18 0413   08/21/18 1040  gentamicin (GARAMYCIN) 80 mg in sodium chloride 0.9 % 500 mL irrigation  Status:  Discontinued       As needed 08/21/18 1040 08/21/18 1215   08/20/18 2215  ceFAZolin (ANCEF) 3 g in dextrose 5 % 50 mL IVPB     3 g 100 mL/hr over  30 Minutes Intravenous  Once 08/20/18 2208 08/21/18 1016    .  She was given sequential compression devices, early ambulation, and Eliquis, TED hose for DVT prophylaxis.  She benefited maximally from the hospital stay and there were no complications.    Recent vital signs:  Vitals:   08/24/18 0737 08/24/18 1008  BP: (!) 95/46   Pulse: 72 68  Resp: 16   Temp: 98.1 F (36.7 C)   SpO2: 98% 94%    Recent laboratory studies:  Lab Results  Component Value Date   HGB 10.6 (L) 08/24/2018   HGB 10.5 (L) 08/23/2018   HGB 11.6 (L) 08/22/2018   Lab Results  Component Value Date   WBC 7.7 08/24/2018   PLT 117 (L) 08/24/2018   Lab Results  Component Value Date   INR 1.10 08/08/2018   Lab Results  Component Value Date   NA 141 08/24/2018   K 4.3 08/24/2018   CL 104 08/24/2018   CO2 32 08/24/2018   BUN 14 08/24/2018   CREATININE 0.61 08/24/2018   GLUCOSE 109 (H) 08/24/2018    Discharge Medications:  Allergies as of 08/24/2018      Reactions   Prednisone Other (See Comments)   Agitation, hyperactivity, polyphagia   Hydrocodone-acetaminophen Other (See Comments)   Hallucinations and combative   Oxycodone Itching   Can take it with benadryl   Tramadol Itching   Can take it with benadryl   Adhesive [tape] Itching, Rash   Prefers paper tape   Latex Itching, Rash   use paper tap      Medication List    TAKE these medications   ALPRAZolam 0.25 MG tablet Commonly known as:  XANAX Take 1 tablet (0.25 mg total) by mouth daily as needed for anxiety.   amiodarone 200 MG tablet Commonly known as:  PACERONE Take 1 tablet (200 mg total) by mouth daily.   amoxicillin-clavulanate 875-125 MG tablet Commonly known as:  AUGMENTIN Take 1 tablet by mouth 2 (two) times daily.   bisoprolol 5 MG tablet Commonly known as:  ZEBETA Take 0.5 tablets (2.5 mg total) by mouth daily.   calcium gluconate 500 MG tablet Take 500 tablets by mouth daily.   ELIQUIS 5 MG Tabs  tablet Generic drug:  apixaban TAKE 1 TABLET BY MOUTH TWICE A DAY   esomeprazole 20 MG capsule Commonly known as:  NEXIUM Take 20 mg by mouth daily as needed (acid reflux).   furosemide 20 MG tablet Commonly known as:  LASIX TAKE 1 TABLET (20 MG TOTAL) BY MOUTH 2 (TWO) TIMES DAILY. What changed:  when to take this   gabapentin 300 MG capsule Commonly known as:  NEURONTIN Take 1-3 capsules (300-900 mg total) by mouth 4 (four) times daily.   hydrALAZINE 25 MG tablet Commonly known as:  APRESOLINE Take 1 tablet (25 mg total) by mouth 4 (four) times daily.   KLOR-CON 10 10 MEQ tablet Generic drug:  potassium chloride TAKE 1 TABLET BY MOUTH TWICE A DAY What changed:    how much to take  when to take this   losartan 25 MG tablet Commonly known as:  COZAAR TAKE 1 TABLET BY MOUTH EVERY DAY   methocarbamol 500 MG tablet Commonly known as:  ROBAXIN Take 1 tablet (500 mg total) by mouth every 6 (six) hours as needed for muscle spasms.   multivitamin tablet Take 1 tablet by mouth daily.   oxymetazoline 0.05 % nasal spray Commonly known as:  AFRIN Place 1 spray into both nostrils at bedtime as needed for congestion.   ranolazine 500 MG 12 hr tablet Commonly known as:  RANEXA TAKE 1 TABLET BY MOUTH TWICE A DAY   rizatriptan 10 MG tablet Commonly known as:  MAXALT TAKE 1 TABLET (10 MG TOTAL) BY MOUTH AS NEEDED FOR MIGRAINE. MAY REPEAT IN 2 HOURS IF NEEDED   rosuvastatin 20 MG tablet Commonly known as:  CRESTOR TAKE 1 TABLET BY MOUTH EVERY DAY   sertraline 100 MG tablet Commonly known as:  ZOLOFT TAKE 1 TABLET BY MOUTH EVERY DAY   VITAMIN B COMPLEX PO Take 1 tablet by mouth daily.   Vitamin D3 125 MCG (5000 UT) Caps Take 5,000 Units by mouth daily.       Diagnostic Studies: Ct Angio Chest Pe W Or Wo Contrast  Result Date: 08/23/2018 CLINICAL DATA:  Shortness of breath.  Right hip surgery 2 days ago. EXAM: CT ANGIOGRAPHY CHEST WITH CONTRAST TECHNIQUE:  Multidetector CT imaging of the chest was performed using the standard protocol during bolus administration of intravenous contrast. Multiplanar CT image reconstructions and MIPs were obtained to evaluate the  vascular anatomy. CONTRAST:  67mL OMNIPAQUE IOHEXOL 350 MG/ML SOLN COMPARISON:  Chest radiograph 08/23/2018 FINDINGS: Cardiovascular: Satisfactory opacification of the pulmonary arteries to the lobar level. The segmental arteries are suboptimally visualized. No evidence of central pulmonary embolism. Normal heart size. No pericardial effusion. Mediastinum/Nodes: No enlarged mediastinal, hilar, or axillary lymph nodes. Thyroid gland, trachea, and esophagus demonstrate no significant findings. Lungs/Pleura: Atelectatic changes in bilateral lung bases and lingula. Upper Abdomen: No acute abnormality. Musculoskeletal: No chest wall abnormality. No acute or significant osseous findings. Review of the MIP images confirms the above findings. IMPRESSION: No evidence of central pulmonary embolus. The segmental arterial branches are suboptimally visualized. Atelectatic changes in the bilateral lung bases and lingula. Electronically Signed   By: Fidela Salisbury M.D.   On: 08/23/2018 16:45   Dg Chest Port 1 View  Result Date: 08/23/2018 CLINICAL DATA:  Hypoxia HTN, COPD, CHF, afib, Former smoker x 15 years ago EXAM: PORTABLE CHEST 1 VIEW COMPARISON:  04/08/2018 FINDINGS: Enlarged cardiac silhouette. Low lung volumes. Normal pulmonary vasculature. No effusion, infiltrate or pneumothorax. IMPRESSION: No acute cardiopulmonary process. Electronically Signed   By: Suzy Bouchard M.D.   On: 08/23/2018 10:04   Dg Hip Operative Unilat W Or W/o Pelvis Right  Result Date: 08/21/2018 CLINICAL DATA:  68 year old female for right hip replacement. Subsequent encounter. EXAM: OPERATIVE right HIP (WITH PELVIS IF PERFORMED) 2 VIEWS TECHNIQUE: Fluoroscopic spot image(s) were submitted for interpretation post-operatively.  Fluoroscopic time: 24 seconds. COMPARISON:  04/04/2018 MR. FINDINGS: Two intraoperative C-arm views submitted for review after surgery. The superior aspect of the right hip replacement is imaged. The portion visualized appears in satisfactory position on frontal projection without obvious complication. The full extent of the prosthesis can be assessed on follow-up plain film imaging. IMPRESSION: Post total right hip replacement. Electronically Signed   By: Genia Del M.D.   On: 08/21/2018 12:36   Dg Hip Unilat W Or W/o Pelvis 2-3 Views Right  Result Date: 08/21/2018 CLINICAL DATA:  68 year old female post right hip replacement. Subsequent encounter. EXAM: DG HIP (WITH OR WITHOUT PELVIS) 2-3V RIGHT COMPARISON:  Intraoperative exam same date. FINDINGS: Post total right hip replacement which appears in satisfactory position without complication noted. Cross-table lateral view somewhat limited by patient's habitus. Drain in place. IMPRESSION: Post total right hip replacement. Electronically Signed   By: Genia Del M.D.   On: 08/21/2018 12:50    Disposition:     Contact information for after-discharge care    [facility] .   Service:  Skilled Nursing Contact information: [place] [phone]               Signed: [place] 08/24/2018, 11:54 AM

## 2018-08-24 NOTE — Progress Notes (Signed)
BPs continue to be soft unable to give pain med

## 2018-08-24 NOTE — Progress Notes (Signed)
   Subjective: 3 Days Post-Op Procedure(s) (LRB): TOTAL HIP ARTHROPLASTY ANTERIOR APPROACH-RIGHT CELL SAVER REQUESTED (Right) Patient reports pain as moderate.  Patient with confusion this am. Patient is well, and has had no acute complaints or problems Denies any CP, SOB, ABD pain. Plan is to go Skilled nursing facility after hospital stay once medically stable.  Objective: Vital signs in last 24 hours: Temp:  [98.1 F (36.7 C)-99.9 F (37.7 C)] 98.1 F (36.7 C) (02/14 0737) Pulse Rate:  [66-75] 72 (02/14 0737) Resp:  [16-18] 16 (02/14 0737) BP: (95-120)/(42-57) 95/46 (02/14 0737) SpO2:  [95 %-98 %] 98 % (02/14 0737)  Intake/Output from previous day: 02/13 0701 - 02/14 0700 In: 480 [P.O.:480] Out: 1350 [Urine:1350] Intake/Output this shift: No intake/output data recorded.  Recent Labs    08/22/18 0441 08/23/18 0451 08/24/18 0452  HGB 11.6* 10.5* 10.6*   Recent Labs    08/23/18 0451 08/24/18 0452  WBC 9.7 7.7  RBC 3.37* 3.48*  HCT 34.3* 34.4*  PLT 121* 117*   Recent Labs    08/23/18 0451 08/24/18 0452  NA 139 141  K 4.2 4.3  CL 105 104  CO2 31 32  BUN 17 14  CREATININE 0.81 0.61  GLUCOSE 111* 109*  CALCIUM 8.1* 8.4*   No results for input(s): LABPT, INR in the last 72 hours.  EXAM General - Patient is Alert, Appropriate and Oriented Extremity - Neurovascular intact Sensation intact distally Intact pulses distally Dorsiflexion/Plantar flexion intact No cellulitis present Compartment soft Dressing - dressing C/D/I and no drainage Hemovac and Praveena dressing intact. Motor Function - intact, moving foot and toes well on exam.   Past Medical History:  Diagnosis Date  . Achilles tendinitis   . Acute medial meniscal tear   . Allergy   . Anxiety   . Arthritis   . Atrial fibrillation ([place])    a. new onset 07/2016; b. CHADS2VASc --> 2 (HTN, female); c. eliquis  . CHF (congestive heart failure) ([place])   . Chronic lower back pain   . COPD (chronic  obstructive pulmonary disease) ([place])   . Depression   . Fibrocystic breast disease   . GERD (gastroesophageal reflux disease)   . Hyperlipidemia   . Hypertension   . Insomnia   . Lipoma   . Migraine   . Osteoarthritis    left knee  . Osteopenia   . Sleep apnea    does not use her cpap  . Tendonitis   . Thyroid disease   . Vitamin D deficiency     Assessment/Plan:   3 Days Post-Op Procedure(s) (LRB): TOTAL HIP ARTHROPLASTY ANTERIOR APPROACH-RIGHT CELL SAVER REQUESTED (Right) Active Problems:   Status post total hip replacement, right   Hypoxia   AMS  Estimated body mass index is 53.68 kg/m as calculated from the following:   Height as of this encounter: 5\' 5"  (1.651 m).   Weight as of this encounter: 146.3 kg. Advance diet Up with therapy, partial weightbearing right lower extremity x4 weeks Labs and vital signs are stable. Needs bowel movement Appreciate IM input for altered mental status and hypoxia - UA pending. Narcotics held. Continue with O2 via nasal canula. CT chest negative for PE. Care manager to assist with discharge to skilled nursing facility once medically stable  DVT Prophylaxis - TED hose and SCDs, Eliquis Partial weightbearing right lower extremity   T. Rachelle Hora, PA-C [place] 08/24/2018, 7:50 AM

## 2018-08-24 NOTE — Progress Notes (Signed)
Pt continues to state that she doesn't want CPAP. O2 levels on RA drop to 84-86%. Pt is very confused and drowsy. Education given.

## 2018-08-25 LAB — BASIC METABOLIC PANEL
Anion gap: 6 (ref 5–15)
BUN: 13 mg/dL (ref 8–23)
CO2: 30 mmol/L (ref 22–32)
Calcium: 8.2 mg/dL — ABNORMAL LOW (ref 8.9–10.3)
Chloride: 101 mmol/L (ref 98–111)
Creatinine, Ser: 0.63 mg/dL (ref 0.44–1.00)
GFR calc Af Amer: >60 mL/min (ref >60)
GFR calc non Af Amer: >60 mL/min (ref >60)
Glucose, Bld: 99 mg/dL (ref 70–99)
Potassium: 4.1 mmol/L (ref 3.5–5.1)
Sodium: 137 mmol/L (ref 135–145)

## 2018-08-25 LAB — CBC
HCT: 31 % — ABNORMAL LOW (ref 36.0–46.0)
Hemoglobin: 10 g/dL — ABNORMAL LOW (ref 12.0–15.0)
MCH: 31.1 pg (ref 26.0–34.0)
MCHC: 32.3 g/dL (ref 30.0–36.0)
MCV: 96.3 fL (ref 80.0–100.0)
Platelets: 167 10*3/uL (ref 150–400)
RBC: 3.22 MIL/uL — ABNORMAL LOW (ref 3.87–5.11)
RDW: 12.9 % (ref 11.5–15.5)
WBC: 8.1 10*3/uL (ref 4.0–10.5)
nRBC: 0 % (ref 0.0–0.2)

## 2018-08-25 MED ORDER — LOSARTAN POTASSIUM 25 MG PO TABS: 25.0000 mg | ORAL_TABLET | Freq: Every day | ORAL | Status: DC

## 2018-08-25 MED ORDER — FUROSEMIDE 20 MG PO TABS: 20.0000 mg | ORAL_TABLET | Freq: Every day | ORAL | Status: DC

## 2018-08-25 NOTE — Progress Notes (Signed)
[facility] [place] NAME: Amber Barnes    MR#:  [id number]  DATE OF BIRTH:  09-05-1950  SUBJECTIVE:  CHIEF COMPLAINT:  No chief complaint on file.  The patient is awake and alert, she was on CPAP last night, off oxygen by nasal cannula. REVIEW OF SYSTEMS:  Review of Systems  Constitutional: Negative for chills, fever and malaise/fatigue.  HENT: Negative for sore throat.   Eyes: Negative for blurred vision and double vision.  Respiratory: Negative for cough, hemoptysis, shortness of breath, wheezing and stridor.   Cardiovascular: Negative for chest pain, palpitations, orthopnea and leg swelling.  Gastrointestinal: Negative for abdominal pain, blood in stool, diarrhea, melena, nausea and vomiting.  Genitourinary: Negative for dysuria, flank pain and hematuria.  Musculoskeletal: Negative for back pain and joint pain.  Neurological: Negative for dizziness, sensory change, focal weakness, seizures, loss of consciousness, weakness and headaches.  Endo/Heme/Allergies: Negative for polydipsia.  Psychiatric/Behavioral: Negative for depression. The patient is not nervous/anxious.     DRUG ALLERGIES:   Allergies  Allergen Reactions  . Prednisone Other (See Comments)    Agitation, hyperactivity, polyphagia  . Hydrocodone-Acetaminophen Other (See Comments)    Hallucinations and combative  . Oxycodone Itching    Can take it with benadryl  . Tramadol Itching    Can take it with benadryl  . Adhesive [Tape] Itching and Rash    Prefers paper tape  . Latex Itching and Rash    use paper tap   VITALS:  Blood pressure (!) 157/64, pulse 69, temperature 98.1 F (36.7 C), temperature source Oral, resp. rate 18, height 5\' 5"  (1.651 m), weight (!) 146.3 kg, SpO2 96 %. PHYSICAL EXAMINATION:  Physical Exam Constitutional:      General: She is not in acute distress.    Comments: Morbid obesity.  HENT:     Head: Normocephalic.     Nose: No  congestion.  Eyes:     General: No scleral icterus.    Conjunctiva/sclera: Conjunctivae normal.     Pupils: Pupils are equal, round, and reactive to light.  Neck:     Musculoskeletal: Normal range of motion and neck supple.     Vascular: No JVD.     Trachea: No tracheal deviation.  Cardiovascular:     Rate and Rhythm: Normal rate and regular rhythm.     Heart sounds: Normal heart sounds. No murmur. No gallop.   Pulmonary:     Effort: Pulmonary effort is normal. No respiratory distress.     Breath sounds: Normal breath sounds. No wheezing or rales.  Abdominal:     General: Bowel sounds are normal. There is no distension.     Palpations: Abdomen is soft.     Tenderness: There is no abdominal tenderness. There is no rebound.  Musculoskeletal: Normal range of motion.        General: No tenderness.  Skin:    Findings: No erythema or rash.  Neurological:     General: No focal deficit present.     Mental Status: She is alert and oriented to person, place, and time.     Cranial Nerves: No cranial nerve deficit.  Psychiatric:        Mood and Affect: Mood normal.    LABORATORY PANEL:  Female CBC Recent Labs  Lab 08/25/18 0804  WBC 8.1  HGB 10.0*  HCT 31.0*  PLT 167   ------------------------------------------------------------------------------------------------------------------ Chemistries  Recent Labs  Lab 08/25/18 0804  NA 137  K 4.1  CL 101  CO2 30  GLUCOSE 99  BUN 13  CREATININE 0.63  CALCIUM 8.2*   RADIOLOGY:  No results found. ASSESSMENT AND PLAN:  68 year old obese female patient with a known history of obesity, chronic atrial fibrillation, congestive heart failure, COPD, GERD, hyperlipidemia, hypertension, osteoarthritis, sleep apnea noncompliant with CPAP currently under orthopedic service consulted for hypoxia and confusion.    Morbid obesity with hypoventilation syndrome Weight loss counseled. Off oxygen via nasal cannula. Incentive  spirometry.  -Obstructive sleep apnea Patient is not compliance to CPAP at night, she needs to use CPAP at bedtime Explained to patient in detail the compliance of wearing the machine.  -Lethargy and altered mental status secondary to hypoxia from sleep apnea Back to baseline.  COPD.  Stable nebulizer PRN. Chronic diastolic CHF, LV EF: 40% -   60%.  Stable.  Hold Lasix due to softer blood pressure. Resume.  -Status post right hip arthroplasty Orthopedic follow-up Physical therapy to continue the regimen  -Chronic atrial fibrillation Continue amiodarone for rate control Continue Eliquis for anticoagulation  -DVT prophylaxis On Eliquis for anticoagulation  Hypertension.  resume hypertension medication.  Medically stable, sign off. All the records are reviewed and case discussed with Care Management/Social Worker. Management plans discussed with the patient, her daughter-in-law and they are in agreement.  CODE STATUS: Full Code  TOTAL TIME TAKING CARE OF THIS PATIENT: 25 minutes.   More than 50% of the time was spent in counseling/coordination of care: YES  POSSIBLE D/C IN today, DEPENDING ON CLINICAL CONDITION.   Demetrios Loll M.D on 08/25/2018 at 9:18 AM  Between 7am to 6pm - Pager - [phone]  After 6pm go to [email or web address] - [facility]

## 2018-08-25 NOTE — Progress Notes (Signed)
Called report to [facility]. Answered all questions. EMS called for transport.

## 2018-08-25 NOTE — Progress Notes (Signed)
Physical Therapy Treatment Patient Details Name: Cynethia Schindler MRN: [id number] DOB: 21-Mar-1951 Today's Date: 08/25/2018    History of Present Illness 68 y/o female s/p R anterior total hip.    PT Comments    Pt had recently gotten back to bed and as she is going to be discharged later today requests to stay in bed.  She was willing to participate with some supine LE exercises and though she is weak and pain limited did show good effort.     Follow Up Recommendations  SNF     Equipment Recommendations       Recommendations for Other Services       Precautions / Restrictions Precautions Precautions: Fall;Anterior Hip Restrictions Weight Bearing Restrictions: Yes RLE Weight Bearing: Partial weight bearing RLE Partial Weight Bearing Percentage or Pounds: 50    Mobility  Bed Mobility               General bed mobility comments: pt had just gotten back to bed from recliner, deferred mobility tasks  Transfers                    Ambulation/Gait                 Stairs             Wheelchair Mobility    Modified Rankin (Stroke Patients Only)       Balance                                            Cognition Arousal/Alertness: Lethargic Behavior During Therapy: WFL for tasks assessed/performed Overall Cognitive Status: Within Functional Limits for tasks assessed                                        Exercises Total Joint Exercises Ankle Circles/Pumps: 10 reps;Strengthening Quad Sets: Strengthening;10 reps Short Arc Quad: AROM;AAROM;10 reps Heel Slides: AAROM;10 reps Hip ABduction/ADduction: AAROM;10 reps    General Comments        Pertinent Vitals/Pain Pain Assessment: 0-10 Pain Score: 4     Home Living                      Prior Function            PT Goals (current goals can now be found in the care plan section)      Frequency    BID      PT Plan Current  plan remains appropriate    Co-evaluation              AM-PAC PT "6 Clicks" Mobility   Outcome Measure  Help needed turning from your back to your side while in a flat bed without using bedrails?: A Lot Help needed moving from lying on your back to sitting on the side of a flat bed without using bedrails?: Total Help needed moving to and from a bed to a chair (including a wheelchair)?: A Lot Help needed standing up from a chair using your arms (e.g., wheelchair or bedside chair)?: A Lot Help needed to walk in hospital room?: A Lot Help needed climbing 3-5 steps with a railing? : Total 6 Click Score: 10    End of Session   Activity Tolerance: Patient  limited by pain;Patient limited by fatigue Patient left: with bed alarm set;with call bell/phone within reach Nurse Communication: Mobility status PT Visit Diagnosis: Muscle weakness (generalized) (M62.81);Difficulty in walking, not elsewhere classified (R26.2);Pain Pain - Right/Left: Right Pain - part of body: Hip     Time: 8675-1982 PT Time Calculation (min) (ACUTE ONLY): 15 min  Charges:  $Therapeutic Exercise: 8-22 mins                     Kreg Shropshire, DPT 08/25/2018, 3:34 PM

## 2018-08-25 NOTE — Progress Notes (Signed)
Subjective: 4 Days Post-Op Procedure(s) (LRB): TOTAL HIP ARTHROPLASTY ANTERIOR APPROACH-RIGHT CELL SAVER REQUESTED (Right) Patient reports pain as moderate.  Continued confusion this morning but much better today than yesterday.  Patient did sleep with her CPAP last night. Patient is well, and has had no acute complaints or problems Denies any CP, SOB, ABD pain. Plan is to go Skilled nursing facility after hospital stay once medically stable.  Objective: Vital signs in last 24 hours: Temp:  [98.6 F (37 C)-98.8 F (37.1 C)] 98.6 F (37 C) (02/14 2320) Pulse Rate:  [62-68] 64 (02/14 2320) Resp:  [16-18] 18 (02/14 2320) BP: (101-129)/(46-64) 129/53 (02/14 2320) SpO2:  [94 %-100 %] 98 % (02/14 2320)  Intake/Output from previous day: 02/14 0701 - 02/15 0700 In: -  Out: 450 [Urine:450] Intake/Output this shift: No intake/output data recorded.  Recent Labs    08/23/18 0451 08/24/18 0452  HGB 10.5* 10.6*   Recent Labs    08/23/18 0451 08/24/18 0452  WBC 9.7 7.7  RBC 3.37* 3.48*  HCT 34.3* 34.4*  PLT 121* 117*   Recent Labs    08/23/18 0451 08/24/18 0452  NA 139 141  K 4.2 4.3  CL 105 104  CO2 31 32  BUN 17 14  CREATININE 0.81 0.61  GLUCOSE 111* 109*  CALCIUM 8.1* 8.4*   No results for input(s): LABPT, INR in the last 72 hours.  EXAM General - Patient is Alert, Appropriate and Oriented to person and time.  Not oriented to place.  Patient states she is in a sleep apnea facility. Extremity - Neurovascular intact Sensation intact distally Intact pulses distally Dorsiflexion/Plantar flexion intact No cellulitis present Compartment soft Dressing - dressing C/D/I and no drainage .Praveena dressing intact, no drainage in canister. Motor Function - intact, moving foot and toes well on exam.   Past Medical History:  Diagnosis Date  . Achilles tendinitis   . Acute medial meniscal tear   . Allergy   . Anxiety   . Arthritis   . Atrial fibrillation ([place])    a. new onset 07/2016; b. CHADS2VASc --> 2 (HTN, female); c. eliquis  . CHF (congestive heart failure) ([place])   . Chronic lower back pain   . COPD (chronic obstructive pulmonary disease) ([place])   . Depression   . Fibrocystic breast disease   . GERD (gastroesophageal reflux disease)   . Hyperlipidemia   . Hypertension   . Insomnia   . Lipoma   . Migraine   . Osteoarthritis    left knee  . Osteopenia   . Sleep apnea    does not use her cpap  . Tendonitis   . Thyroid disease   . Vitamin D deficiency     Assessment/Plan:   4 Days Post-Op Procedure(s) (LRB): TOTAL HIP ARTHROPLASTY ANTERIOR APPROACH-RIGHT CELL SAVER REQUESTED (Right) Active Problems:   Status post total hip replacement, right   Hypoxia   AMS  Estimated body mass index is 53.68 kg/m as calculated from the following:   Height as of this encounter: 5\' 5"  (1.651 m).   Weight as of this encounter: 146.3 kg. Advance diet Up with therapy, partial weightbearing right lower extremity x4 weeks Vital signs are stable with 3 L of O2. Labs pending. Appreciate IM input for altered mental status and hypoxia - mental status improving.  Narcotics are continuing to be held.  Patient did use CPAP last night.  UA pending. Continue with O2 via nasal canula. CT chest negative for PE. Care manager  to assist with discharge to skilled nursing facility once medically stable and confusion has resolved.  DVT Prophylaxis - TED hose and SCDs, Eliquis Partial weightbearing right lower extremity   T. Rachelle Hora, PA-C [place] 08/25/2018, 7:38 AM

## 2018-08-25 NOTE — Progress Notes (Signed)
Physical Therapy Treatment Patient Details Name: Amber Barnes MRN: [id number] DOB: 08-02-1950 Today's Date: 08/25/2018    History of Present Illness admitted for acute hospitalization status post R THA, anterior approach (08/21/2018), PWB.    PT Comments    Pt awake and engaged in session.  Participated in exercises as described below.  To edge of bed with mod a x 1 and good effort.  Once sitting she was able to stand with min a x 2 and transfer to commode at bedside.  Bathing was performed with nurse tech and pt was able to stand with walker and min a x 2 for care.  After seated rest, she was able to transfer to recliner with min a x 2.  Pt did better than expected this session with overall excellent effort and ability to participate.  Sats on room air remained in 90's.  She elected to remain in recliner after session.  She does require +2 for transfers for general safety and gait is limited to transfers only at this time.  She remains PWB and relies heavily on walker to maintain.   Follow Up Recommendations  SNF     Equipment Recommendations       Recommendations for Other Services       Precautions / Restrictions Precautions Precautions: Fall;Anterior Hip Precaution Comments: R hip Restrictions Weight Bearing Restrictions: Yes RLE Weight Bearing: Partial weight bearing RLE Partial Weight Bearing Percentage or Pounds: 50    Mobility  Bed Mobility Overal bed mobility: Needs Assistance Bed Mobility: Sit to Supine     Supine to sit: Mod assist        Transfers Overall transfer level: Needs assistance Equipment used: Rolling walker (2 wheeled) Transfers: Sit to/from Stand Sit to Stand: Min assist;+2 physical assistance            Ambulation/Gait Ambulation/Gait assistance: Min assist;+2 physical assistance   Assistive device: Rolling walker (2 wheeled) Gait Pattern/deviations: Step-to pattern;Decreased step length - right;Decreased step length -  left;Decreased stance time - right Gait velocity: decreased   General Gait Details: able to transfer x 2 during session with heavy use of walker and fatigues quickly but able to step with no LOB or buckling.  +2 assist for safety appropraite..   Stairs             Wheelchair Mobility    Modified Rankin (Stroke Patients Only)       Balance Overall balance assessment: Needs assistance Sitting-balance support: No upper extremity supported;Feet supported Sitting balance-Leahy Scale: Fair     Standing balance support: Bilateral upper extremity supported Standing balance-Leahy Scale: Poor Standing balance comment: heavy relaince on walker and poor posture.                            Cognition Arousal/Alertness: Awake/alert Behavior During Therapy: WFL for tasks assessed/performed Overall Cognitive Status: Within Functional Limits for tasks assessed                                 General Comments: significant improvement this session,       Exercises Other Exercises Other Exercises: supine P/AAROM ankle pumps, heel slides, ab/add to neutral ranges.  Good participation and effort today. Other Exercises: to commode and assisted nurse tech with bathing and static standing for care.    General Comments        Pertinent Vitals/Pain Pain Assessment:  Faces Faces Pain Scale: Hurts little more Pain Location: L hip Pain Descriptors / Indicators: Aching;Sore Pain Intervention(s): Limited activity within patient's tolerance;Monitored during session;Repositioned    Home Living                      Prior Function            PT Goals (current goals can now be found in the care plan section) Progress towards PT goals: Progressing toward goals    Frequency    BID      PT Plan Current plan remains appropriate    Co-evaluation              AM-PAC PT "6 Clicks" Mobility   Outcome Measure  Help needed turning from your back  to your side while in a flat bed without using bedrails?: A Lot Help needed moving from lying on your back to sitting on the side of a flat bed without using bedrails?: Total Help needed moving to and from a bed to a chair (including a wheelchair)?: A Lot Help needed standing up from a chair using your arms (e.g., wheelchair or bedside chair)?: A Lot Help needed to walk in hospital room?: A Lot Help needed climbing 3-5 steps with a railing? : Total 6 Click Score: 10    End of Session Equipment Utilized During Treatment: Gait belt Activity Tolerance: Patient tolerated treatment well;Patient limited by fatigue Patient left: in chair;with call bell/phone within reach;with chair alarm set;with nursing/sitter in room Nurse Communication: Mobility status Pain - Right/Left: Right Pain - part of body: Hip     Time: 1610-9604 PT Time Calculation (min) (ACUTE ONLY): 30 min  Charges:  $Therapeutic Exercise: 8-22 mins $Therapeutic Activity: 8-22 mins                     Chesley Noon, PTA 08/25/18, 10:29 AM

## 2018-08-25 NOTE — Clinical Social Work Note (Signed)
The patient will discharge today to [facility] via non-emergent EMS. The patient and facility are aware and in agreement. The CSW has faxed all discharge information and has delivered the discharge packet. The CSW is signing off. Please consult should needs arise.  Santiago Bumpers, MSW, Latanya Presser [phone]

## 2018-08-27 DIAGNOSIS — M8949 Other hypertrophic osteoarthropathy, multiple sites: Secondary | ICD-10-CM | POA: Insufficient documentation

## 2018-08-27 DIAGNOSIS — M159 Polyosteoarthritis, unspecified: Secondary | ICD-10-CM | POA: Insufficient documentation

## 2018-08-27 DIAGNOSIS — I4891 Unspecified atrial fibrillation: Secondary | ICD-10-CM | POA: Insufficient documentation

## 2018-08-27 DIAGNOSIS — F325 Major depressive disorder, single episode, in full remission: Secondary | ICD-10-CM | POA: Insufficient documentation

## 2018-08-27 DIAGNOSIS — F32A Depression, unspecified: Secondary | ICD-10-CM | POA: Insufficient documentation

## 2018-08-27 DIAGNOSIS — J449 Chronic obstructive pulmonary disease, unspecified: Secondary | ICD-10-CM | POA: Insufficient documentation

## 2018-08-30 ENCOUNTER — Other Ambulatory Visit: Payer: Self-pay | Admitting: Cardiovascular Disease

## 2018-09-04 ENCOUNTER — Telehealth: Payer: Self-pay | Admitting: Cardiovascular Disease

## 2018-09-04 NOTE — Telephone Encounter (Signed)
LMOV to schedule   [place], [place] Scheduling        Patient needs an appointment for further refills. If patient does not want to schedule an appointment please make them aware to contact PCP for refills. I have sent in enough medication until appointment can be made.   Thanks Ladies!

## 2018-09-07 NOTE — Telephone Encounter (Signed)
°*  STAT* If patient is at the pharmacy, call can be transferred to refill team.   1. Which medications need to be refilled? (please list name of each medication and dose if known) ranolazine (RANEXA) 500 MG - 1 tablet 2 times daily   2. Which pharmacy/location (including street and city if local pharmacy) is medication to be sent to? [facility] [place]   3. Do they need a 30 day or 90 day supply? 90 days   Patient scheduled to see Dr [name] on 4/14

## 2018-09-10 ENCOUNTER — Encounter: Payer: Self-pay | Admitting: Family

## 2018-09-10 ENCOUNTER — Ambulatory Visit: Payer: Medicare Other | Admitting: Neurology

## 2018-09-10 ENCOUNTER — Ambulatory Visit (INDEPENDENT_AMBULATORY_CARE_PROVIDER_SITE_OTHER): Payer: Medicare Other | Admitting: Family

## 2018-09-10 DIAGNOSIS — I10 Essential (primary) hypertension: Secondary | ICD-10-CM | POA: Diagnosis not present

## 2018-09-10 DIAGNOSIS — F32A Depression, unspecified: Secondary | ICD-10-CM

## 2018-09-10 DIAGNOSIS — F329 Major depressive disorder, single episode, unspecified: Secondary | ICD-10-CM

## 2018-09-10 DIAGNOSIS — F419 Anxiety disorder, unspecified: Secondary | ICD-10-CM

## 2018-09-10 DIAGNOSIS — M1611 Unilateral primary osteoarthritis, right hip: Secondary | ICD-10-CM

## 2018-09-10 NOTE — Assessment & Plan Note (Signed)
Doing well on Zoloft, will continue

## 2018-09-10 NOTE — Progress Notes (Signed)
Subjective:    Patient ID: Amber Barnes, female    DOB: August 27, 1950, 68 y.o.   MRN: [id number]  CC: Amber Barnes is a 68 y.o. female who presents today for follow up.   HPI: Accompanied by daughter-in-law today   Feels well today. Denies exertional chest pain or pressure, numbness or tingling radiating to left arm or jaw, palpitations, dizziness, frequent headaches, changes in vision, or shortness of breath.  Status post total right hip replacement 08/21/18, Dr. Arvella Nigh with [facility]. Using tyleonol and prn muscle relaxants for pain control. Pain has improved, pain with internal flexin. Using walker at home.  Had been to [facility] for inpatient rehab. Now doing Home PT Anxiety depression-feels better on Zoloft 100 mg. Sleeping well.   atrial fibrillation-decreased bisprolol ( to 2.5mg )  last visit. No dizziness.    OSA- wearing cipap at night again since hospitalization.      Ct 08/23/18- negative pe.  Post surgical Anemia 08/25/18.   HISTORY:  Past Medical History:  Diagnosis Date  . Achilles tendinitis   . Acute medial meniscal tear   . Allergy   . Anxiety   . Arthritis   . Atrial fibrillation ([place])    a. new onset 07/2016; b. CHADS2VASc --> 2 (HTN, female); c. eliquis  . CHF (congestive heart failure) ([employer])   . Chronic lower back pain   . COPD (chronic obstructive pulmonary disease) ([place])   . Depression   . Fibrocystic breast disease   . GERD (gastroesophageal reflux disease)   . Hyperlipidemia   . Hypertension   . Insomnia   . Lipoma   . Migraine   . Osteoarthritis    left knee  . Osteopenia   . Sleep apnea    does not use her cpap  . Tendonitis   . Thyroid disease   . Vitamin D deficiency    Past Surgical History:  Procedure Laterality Date  . ABDOMINAL HYSTERECTOMY     ovaries left  . ACHILLES TENDON SURGERY     2012,01/2017  . BACK SURGERY    . CARDIOVERSION N/A 09/07/2016   Procedure: CARDIOVERSION;  Surgeon: Minna Merritts, MD;   Location: [facility] ORS;  Service: Cardiovascular;  Laterality: N/A;  . CARDIOVERSION N/A 10/19/2016   Procedure: CARDIOVERSION;  Surgeon: Minna Merritts, MD;  Location: [facility] ORS;  Service: Cardiovascular;  Laterality: N/A;  . CARDIOVERSION N/A 12/14/2016   Procedure: CARDIOVERSION;  Surgeon: Minna Merritts, MD;  Location: [facility] ORS;  Service: Cardiovascular;  Laterality: N/A;  . COLONOSCOPY    . ESOPHAGOGASTRODUODENOSCOPY    . THYROIDECTOMY     thyroid lobectomy secondary to a benign nodule  . TOTAL HIP ARTHROPLASTY Right 08/21/2018   Procedure: TOTAL HIP ARTHROPLASTY ANTERIOR APPROACH-RIGHT CELL SAVER REQUESTED;  Surgeon: [name], MD;  Location: [facility] ORS;  Service: Orthopedics;  Laterality: Right;   Family History  Problem Relation Age of Onset  . Cancer Sister 29       ovarian  . Depression Sister   . Cancer Father 42       colon  . Heart disease Father   . Heart attack Father 62       died from MI  . Hyperlipidemia Father   . Hypertension Father   . Cancer Mother        leukemia  . Breast cancer Paternal Aunt        56's  . Migraines Neg Hx     Allergies: Prednisone; Hydrocodone-acetaminophen; Oxycodone; Tramadol; Adhesive [  tape]; and Latex Current Outpatient Medications on File Prior to Visit  Medication Sig Dispense Refill  . ALPRAZolam (XANAX) 0.25 MG tablet Take 1 tablet (0.25 mg total) by mouth daily as needed for anxiety. 10 tablet 0  . amiodarone (PACERONE) 200 MG tablet Take 1 tablet (200 mg total) by mouth daily. 90 tablet 3  . B Complex Vitamins (VITAMIN B COMPLEX PO) Take 1 tablet by mouth daily.     . bisoprolol (ZEBETA) 5 MG tablet Take 0.5 tablets (2.5 mg total) by mouth daily. 90 tablet 1  . calcium gluconate 500 MG tablet Take 500 tablets by mouth daily.    . Cholecalciferol (VITAMIN D3) 5000 units CAPS Take 5,000 Units by mouth daily.    Marland Kitchen ELIQUIS 5 MG TABS tablet TAKE 1 TABLET BY MOUTH TWICE A DAY 60 tablet 3  . esomeprazole (NEXIUM) 20 MG capsule Take  20 mg by mouth daily as needed (acid reflux).    . furosemide (LASIX) 20 MG tablet TAKE 1 TABLET (20 MG TOTAL) BY MOUTH 2 (TWO) TIMES DAILY. (Patient taking differently: Take 20 mg by mouth daily. ) 180 tablet 3  . gabapentin (NEURONTIN) 300 MG capsule Take 1-3 capsules (300-900 mg total) by mouth 4 (four) times daily. 360 capsule 2  . hydrALAZINE (APRESOLINE) 25 MG tablet Take 1 tablet (25 mg total) by mouth 4 (four) times daily. 120 tablet 11  . KLOR-CON 10 10 MEQ tablet TAKE 1 TABLET BY MOUTH TWICE A DAY (Patient taking differently: Take 10 mEq by mouth daily. ) 180 tablet 2  . losartan (COZAAR) 25 MG tablet TAKE 1 TABLET BY MOUTH EVERY DAY (Patient taking differently: Take 25 mg by mouth daily. ) 90 tablet 2  . methocarbamol (ROBAXIN) 500 MG tablet Take 1 tablet (500 mg total) by mouth every 6 (six) hours as needed for muscle spasms. 30 tablet 0  . Multiple Vitamin (MULTIVITAMIN) tablet Take 1 tablet by mouth daily.    Marland Kitchen oxymetazoline (AFRIN) 0.05 % nasal spray Place 1 spray into both nostrils at bedtime as needed for congestion.     . ranolazine (RANEXA) 500 MG 12 hr tablet TAKE 1 TABLET BY MOUTH TWICE A DAY 180 tablet 1  . rizatriptan (MAXALT) 10 MG tablet TAKE 1 TABLET (10 MG TOTAL) BY MOUTH AS NEEDED FOR MIGRAINE. MAY REPEAT IN 2 HOURS IF NEEDED 10 tablet 0  . rosuvastatin (CRESTOR) 20 MG tablet TAKE 1 TABLET BY MOUTH EVERY DAY (Patient taking differently: Take 20 mg by mouth daily. ) 90 tablet 0  . sertraline (ZOLOFT) 100 MG tablet TAKE 1 TABLET BY MOUTH EVERY DAY 90 tablet 2   No current facility-administered medications on file prior to visit.     Social History   Tobacco Use  . Smoking status: Former Smoker    Packs/day: 0.25    Years: 10.00    Pack years: 2.50    Types: Cigarettes    Last attempt to quit: 2005    Years since quitting: 15.1  . Smokeless tobacco: Never Used  . Tobacco comment: smoked for 10 years, 1 pack every 2-3 days  Substance Use Topics  . Alcohol use:  Not Currently    Comment: social, occasional  . Drug use: No    Review of Systems  Constitutional: Negative for chills and fever.  Respiratory: Negative for cough.   Cardiovascular: Negative for chest pain and palpitations.  Gastrointestinal: Negative for nausea and vomiting.  Musculoskeletal: Positive for arthralgias (right hip).  Psychiatric/Behavioral: Negative  for sleep disturbance.      Objective:    BP 102/64 (BP Location: Left Arm, Patient Position: Sitting, Cuff Size: Large)   Pulse 77   Temp 98.1 F (36.7 C)   SpO2 98%  BP Readings from Last 3 Encounters:  09/10/18 102/64  08/25/18 (!) 157/64  08/08/18 (!) 89/59   Wt Readings from Last 3 Encounters:  08/21/18 (!) 322 lb 9.6 oz (146.3 kg)  08/08/18 (!) 322 lb 9.6 oz (146.3 kg)  07/25/18 (!) 324 lb (147 kg)    Physical Exam Vitals signs reviewed.  Constitutional:      Appearance: She is well-developed.  Eyes:     Conjunctiva/sclera: Conjunctivae normal.  Cardiovascular:     Rate and Rhythm: Normal rate and regular rhythm.     Pulses: Normal pulses.     Heart sounds: Normal heart sounds.  Pulmonary:     Effort: Pulmonary effort is normal.     Breath sounds: Normal breath sounds. No wheezing, rhonchi or rales.  Skin:    General: Skin is warm and dry.  Neurological:     Mental Status: She is alert.  Psychiatric:        Speech: Speech normal.        Behavior: Behavior normal.        Thought Content: Thought content normal.        Assessment & Plan:   Problem List Items Addressed This Visit      Cardiovascular and Mediastinum   Essential hypertension    At goal, continue current regimen.        Musculoskeletal and Integument   Osteoarthritis of hip (Right) (Chronic)    Status post placement August 21, 2018.  Doing well today.  will continue physical therapy.  Will follow        Other   Anxiety and depression    Doing well on Zoloft, will continue.          I have discontinued  [name] "Jackie"'s amoxicillin-clavulanate. I am also having her maintain her multivitamin, B Complex Vitamins (VITAMIN B COMPLEX PO), oxymetazoline, Vitamin D3, esomeprazole, calcium gluconate, amiodarone, furosemide, rizatriptan, hydrALAZINE, KLOR-CON 10, rosuvastatin, ALPRAZolam, losartan, bisoprolol, gabapentin, sertraline, ELIQUIS, methocarbamol, and ranolazine.   No orders of the defined types were placed in this encounter.   Return precautions given.   Risks, benefits, and alternatives of the medications and treatment plan prescribed today were discussed, and patient expressed understanding.   Education regarding symptom management and diagnosis given to patient on AVS.  Continue to follow with [name], FNP for routine health maintenance.   [name] and I agreed with plan.   [name], FNP

## 2018-09-10 NOTE — Assessment & Plan Note (Addendum)
Status post placement August 21, 2018.  Doing well today.  will continue physical therapy.  Will follow

## 2018-09-10 NOTE — Patient Instructions (Signed)
You are doing great.  So nice to see you.  Let me know if you need anything at all

## 2018-09-10 NOTE — Assessment & Plan Note (Signed)
At goal, continue current regimen 

## 2018-09-17 ENCOUNTER — Ambulatory Visit: Payer: Self-pay | Admitting: *Deleted

## 2018-09-17 NOTE — Telephone Encounter (Signed)
Maryruth Eve, Physical Therapist with [facility] called because she is concerned about doing PT with the pt's blood pressure at this level; the pt says that she took her BP medication on 09/17/2018 at 0730, and she takes her night dose around 2300; she says that she normally runs 120-130s/70-75; her readings are as follows, and were all taken on 09/17/2018 between  1430-1445; they were taken on her left wrist with digital cuffs;  Physical Therapy cuff:  108/46 @ 1430 P 64 Physical Therapy cuff:  101/46 @ 1431 P 59 Patient's cuff:                  94/45 @ 1432 P 54                                         97/45 @ 1433 P 60                                         100/53 @ 1445 P 58           The pt denies chest pain, dizziness, headache; nurse recommendations made per nurse triage protocol; the pt normally sees [name], but she has no availability; pt offered and accepted appointment with Ashley Royalty, [place], 09/18/2018 at 1400; the pt would like to know how/if to take pm medications; she can be contacted at [phone]; a message can be left on voicemail; notified Butch Penny; will route to office for final disposition.   Reason for Disposition . [9] Systolic BP [id number] AND [8] taking blood pressure medications AND [3] NOT dizzy, lightheaded or weak  Answer Assessment - Initial Assessment Questions 1. BLOOD PRESSURE: "What is the blood pressure?" "Did you take at least two measurements 5 minutes apart?"     *No Answer* 2. ONSET: "When did you take your blood pressure?"     *No Answer* 3. HOW: "How did you obtain the blood pressure?" (e.g., visiting nurse, automatic home BP monitor)     *No Answer* 4. HISTORY: "Do you have a history of low blood pressure?" "What is your blood pressure normally?"     *No Answer* 5. MEDICATIONS: "Are you taking any medications for blood pressure?" If yes: "Have they been changed recently?"     *No Answer* 6. PULSE RATE: "Do you know what your pulse rate  is?"      *No Answer* 7. OTHER SYMPTOMS: "Have you been sick recently?" "Have you had a recent injury?"     *No Answer* 8. PREGNANCY: "Is there any chance you are pregnant?" "When was your last menstrual period?"     *No Answer*  Protocols used: LOW BLOOD PRESSURE-A-AH

## 2018-09-17 NOTE — Telephone Encounter (Signed)
Call patient Since patient is asymptomatic, I would be most comfortable if we were able to see patient in the office and check her blood pressure, perhaps orthostatics etc.  Philis Nettle is wonderful, with a lot of experience.  She is seeing her tomorrow afternoon.  I would just advise patient to abstain from physical therapy until she can be seen tomorrow afternoon.  FYI Lauren- she is a very sweet patient. I got a similar BP reading when she saw me a week ago. Pleased no symptoms, but maybe we could come off of a BP medication slightly?

## 2018-09-17 NOTE — Telephone Encounter (Signed)
I spoke with patient & she will hold off PT until she is sees Materials engineer.

## 2018-09-17 NOTE — Telephone Encounter (Signed)
Pt wants to be advised about pm meds.  Please see triage note from [facility] RN.

## 2018-09-17 NOTE — Telephone Encounter (Signed)
Noted, thank you

## 2018-09-18 ENCOUNTER — Ambulatory Visit (INDEPENDENT_AMBULATORY_CARE_PROVIDER_SITE_OTHER): Payer: Medicare Other | Admitting: Family Medicine

## 2018-09-18 ENCOUNTER — Encounter: Payer: Self-pay | Admitting: Family Medicine

## 2018-09-18 VITALS — BP 142/88 | HR 65 | Temp 97.8°F | Resp 20 | Ht 65.0 in

## 2018-09-18 DIAGNOSIS — I4819 Other persistent atrial fibrillation: Secondary | ICD-10-CM | POA: Diagnosis not present

## 2018-09-18 DIAGNOSIS — I10 Essential (primary) hypertension: Secondary | ICD-10-CM

## 2018-09-18 NOTE — Progress Notes (Signed)
Subjective:    Patient ID: Amber Barnes, female    DOB: 1950/12/25, 68 y.o.   MRN: [id number]  HPI   Patient presents to clinic due to low BP reading yesterday prior to doing physical therapy.  Patient had a right hip replacement in February 2020, and has been doing at home PT.  Physical therapist checked her blood pressure and got reading of 94/45 on her home cuff, physical therapist got a reading of 108/46 and 101/46 on their cuff.  Patient monitor blood pressure as the day went on, a few hours after getting the lower readings she got a reading of 120/75, and then prior to bed got a reading of 134/70.  Patient did not do physical therapy yesterday due to BP readings.  Patient denied any symptoms, no chest pain no dizziness no headache no feeling faint.  Denies any cough, shortness of breath or wheezing, no worsening edema development in lower extremity.  Patient is on blood pressure medications not only for her BP, but also due to persistent atrial fibrillation.  Patient states it has taken a long time to get on a good combination of medications for both blood pressure and A. fib control.  Patient takes amiodarone, bisoprolol, Lasix, losartan, Ranexa for control A. Fib, & BP. She is anticoagulated on Eliquis.   Patient Active Problem List   Diagnosis Date Noted  . Status post total hip replacement, right 08/21/2018  . Acute non-recurrent maxillary sinusitis 07/25/2018  . BMI 50.0-59.9, adult ([place]) 07/16/2018  . Chronic sacroiliac joint pain (Right) 06/21/2018  . Leg numbness (Right) 06/19/2018  . Leg weakness (Right) 06/19/2018  . Back pain with right-sided sciatica 06/19/2018  . Headache 06/06/2018  . Morbid obesity with BMI of 50.0-59.9, adult ([place]) 05/31/2018  . Neurogenic pain 05/21/2018  . Chronic low back pain (Fourth Area of Pain) (Bilateral) (R>L) 05/03/2018  . Abnormal MRI, lumbar spine 05/03/2018  . Lumbar facet arthropathy (Bilateral) (L>R) 05/03/2018  . Lumbar lateral  recess stenosis (L3-4, L4-5) (Left) 05/03/2018  . Lumbar foraminal stenosis (L4-5) (Left) 05/03/2018  . Lumbar central spinal stenosis (L4-5) 05/03/2018  . Latex precautions, history of latex allergy 05/03/2018  . History of lumbar laminectomy 04/25/2018  . Failed back surgical syndrome 04/25/2018  . Osteoarthritis of hip (Right) 04/05/2018  . History of allergy to latex 04/05/2018  . Spondylosis without myelopathy or radiculopathy, lumbosacral region 03/27/2018  . Other specified dorsopathies, sacral and sacrococcygeal region 03/27/2018  . Lumbar facet syndrome (Bilateral) (R>L) 03/21/2018  . Chronic sacroiliac joint pain (Left) 03/21/2018  . Chronic anticoagulation (Eliquis) 03/21/2018  . Chronic groin pain (Primary Area of Pain) (Right) 02/27/2018  . Chronic lower extremity pain (Secondary Area of Pain) (Right) 02/27/2018  . Chronic hip pain [facility] Area of Pain) (Right) 02/27/2018  . Chronic low back pain (Fourth Area of Pain) (Bilateral) (R>L) w/ sciatica (Right) 02/27/2018  . Chronic knee pain (Bilateral) 02/27/2018  . Chronic pain syndrome 02/27/2018  . Pharmacologic therapy 02/27/2018  . Disorder of skeletal system 02/27/2018  . Problems influencing health status 02/27/2018  . Screening mammogram, encounter for 02/02/2018  . Numbness and tingling 12/08/2017  . Olecranon bursitis of elbow (Left) 11/14/2017  . Cervical radiculopathy 11/14/2017  . Atherosclerosis of aorta ([place]) 08/25/2017  . Migraine with aura and without status migrainosus, not intractable 06/27/2017  . Bradycardia 05/22/2017  . Restless leg 04/13/2017  . OSA (obstructive sleep apnea) 01/09/2017  . Acute pain of left shoulder 11/28/2016  . Persistent atrial fibrillation 08/11/2016  .  Acute pulmonary edema (HCC)   . Hypokalemia 07/28/2016  . Chronic foot pain (Left) 07/28/2016  . Atypical chest pain   . Fatty liver 06/30/2016  . Dizziness 06/08/2016  . Family history of ovarian cancer 06/08/2016  . GERD  (gastroesophageal reflux disease) 05/05/2016  . Seasonal rhinitis 05/05/2016  . Routine physical examination 05/05/2016  . Heel spur 11/05/2015  . Tendonitis 11/05/2015  . SOB (shortness of breath) 11/11/2014  . Morbid obesity ([place]) 11/11/2014  . Mixed hyperlipidemia 11/11/2014  . Essential hypertension 11/11/2014  . Anxiety and depression 11/11/2014  . Arthritis, senescent 11/11/2014   Social History   Tobacco Use  . Smoking status: Former Smoker    Packs/day: 0.25    Years: 10.00    Pack years: 2.50    Types: Cigarettes    Last attempt to quit: 2005    Years since quitting: 15.2  . Smokeless tobacco: Never Used  . Tobacco comment: smoked for 10 years, 1 pack every 2-3 days  Substance Use Topics  . Alcohol use: Not Currently    Comment: social, occasional   Review of Systems  Constitutional: Negative for chills, fatigue and fever.  HENT: Negative for congestion, ear pain, sinus pain and sore throat.   Eyes: Negative.   Respiratory: Negative for cough, shortness of breath and wheezing.   Cardiovascular: Negative for chest pain, palpitations and leg swelling.  Gastrointestinal: Negative for abdominal pain, diarrhea, nausea and vomiting.  Genitourinary: Negative for dysuria, frequency and urgency.  Musculoskeletal: right hip pain related to recent hip replacement surgery Skin: Negative for color change, pallor and rash.  Neurological: Negative for syncope, light-headedness and headaches.  Psychiatric/Behavioral: The patient is not nervous/anxious.       Objective:   Physical Exam Vitals signs and nursing note reviewed.  Constitutional:      Appearance: She is not ill-appearing or toxic-appearing.  HENT:     Head: Normocephalic and atraumatic.     Nose: Nose normal.     Mouth/Throat:     Mouth: Mucous membranes are moist.  Eyes:     General: No scleral icterus.    Extraocular Movements: Extraocular movements intact.     Conjunctiva/sclera: Conjunctivae normal.    Neck:     Musculoskeletal: Neck supple.  Cardiovascular:     Rate and Rhythm: Normal rate and regular rhythm.  Pulmonary:     Effort: Pulmonary effort is normal. No respiratory distress.     Breath sounds: Normal breath sounds. No wheezing, rhonchi or rales.  Musculoskeletal:     Right lower leg: No edema.     Left lower leg: No edema.     Comments: Walking with walker due to Right hip replacement in Feb 2020  Skin:    General: Skin is warm and dry.     Coloration: Skin is not jaundiced or pale.  Neurological:     Mental Status: She is alert and oriented to person, place, and time.  Psychiatric:        Mood and Affect: Mood normal.        Behavior: Behavior normal.    Today's Vitals   09/18/18 1405  BP: (!) 142/88  Pulse: 65  Resp: 20  Temp: 97.8 F (36.6 C)  TempSrc: Oral  SpO2: 95%  Height: 5\' 5"  (1.651 m)   Body mass index is 53.68 kg/m.  BP Readings from Last 3 Encounters:  09/18/18 (!) 142/88  09/10/18 102/64  08/25/18 (!) 157/64       Assessment &  Plan:   A total of 25 minutes were spent face-to-face with the patient during this encounter and over half of that time was spent on counseling and coordination of care. The patient was counseled on blood pressure medications, past medical history, moderating of blood pressure, keeping self well-hydrated.  Essential hypertension/persistent A. fib- after reviewing patient's medical history, medications I am wary of changing or reducing any of her medications due to her fluctuations in BP from slightly lower and to slightly higher end.  Patient reports it did take many attempts, medication combinations to find proper combination for her A. fib and BP control.  I am wondering if the lower BP reading could have been from not being hydrated enough at that particular moment, other medication use for pain due to recent hip replacement causing a dip in the blood pressure reading.  Patient is asymptomatic with any of these  lower BP readings, so at this time patient and I jointly agreed to have her continue all of her current blood pressure medications.  She will also keep up good fluid intake and monitor her BPs 1-2 times a day, she will let us know if blood pressure begins to take too low or become too high.  She will also call office right away if she develops chest pain, shortness of breath, faintness or dizziness, increased lower extremity swelling.  She will keep cardiology appointment as in April 2020

## 2018-09-18 NOTE — Patient Instructions (Signed)
Continue all current medications because they help both keep your blood pressure under good control and your heart rhythm under control.  It is possible your BP was slightly low yesterday due to not drinking enough fluids, and possibly a future medications peaking at the same time.  Monitor blood pressures and let us know if they are dipping too low or creeping up too high.  Keep self well-hydrated.  Call office right away if chest pain develops, shortness of breath develops, if you faint or dizzy.

## 2018-09-20 ENCOUNTER — Other Ambulatory Visit: Payer: Self-pay | Admitting: Family

## 2018-09-20 DIAGNOSIS — K760 Fatty (change of) liver, not elsewhere classified: Secondary | ICD-10-CM

## 2018-10-02 ENCOUNTER — Encounter: Payer: Self-pay | Admitting: Family

## 2018-10-03 ENCOUNTER — Ambulatory Visit (INDEPENDENT_AMBULATORY_CARE_PROVIDER_SITE_OTHER): Payer: Medicare Other | Admitting: Family

## 2018-10-03 DIAGNOSIS — R42 Dizziness and giddiness: Secondary | ICD-10-CM

## 2018-10-03 MED ORDER — MECLIZINE HCL 12.5 MG PO TABS: 12.5000 mg | ORAL_TABLET | Freq: Three times a day (TID) | ORAL | 0 refills | Status: DC | PRN

## 2018-10-03 NOTE — Progress Notes (Signed)
This visit type was conducted due to national recommendations for restrictions regarding the COVID-19 pandemic (e.g. social distancing).  This format is felt to be most appropriate for this patient at this time.  All issues noted in this document were discussed and addressed.  No physical exam was performed (except for noted visual exam findings with Video Visits).  Virtual Visit via Video Note  I connected with@ on 10/05/18 at  3:30 PM EDT by a video enabled telemedicine application and verified that I am speaking with the correct person using two identifiers.  Location patient: home Location provider:work  Persons participating in the virtual visit: patient, provider  I discussed the limitations of evaluation and management by telemedicine and the availability of in person appointments. The patient expressed understanding and agreed to proceed.   HPI: CC: feels like has been 'on a ship' x 3 days, waxes and wanes.  Same way felt before when seen by Almyra Free in our clinic last year.    When turns her head to left or right, 'room spins.'   Can move head back and forth quickly  Which she was advised by Gregary Signs, NP; this is helpful.   Symptom does not occur when going from sitting to standing, more when bends or turns when sitting up straight. Doenst feel like when pass out. Very little nausea.  No syncope, vision changes, dizziness, cp, palpitations, congestion, ear pain, hearing loss, tinnitus, fever, vomiting.   Has checking BP at home, 120/77.   No new medications.   H/o afib.   Not sure of what medication she had in the past, not sure if zofran and perhaps another medication ( ?meclizine).    ROS: See pertinent positives and negatives per HPI.  Past Medical History:  Diagnosis Date  . Achilles tendinitis   . Acute medial meniscal tear   . Allergy   . Anxiety   . Arthritis   . Atrial fibrillation ([place])    a. new onset 07/2016; b. CHADS2VASc --> 2 (HTN, female); c. eliquis  . CHF  (congestive heart failure) ([place])   . Chronic lower back pain   . COPD (chronic obstructive pulmonary disease) ([place])   . Depression   . Fibrocystic breast disease   . GERD (gastroesophageal reflux disease)   . Hyperlipidemia   . Hypertension   . Insomnia   . Lipoma   . Migraine   . Osteoarthritis    left knee  . Osteopenia   . Sleep apnea    does not use her cpap  . Tendonitis   . Thyroid disease   . Vitamin D deficiency     Past Surgical History:  Procedure Laterality Date  . ABDOMINAL HYSTERECTOMY     ovaries left  . ACHILLES TENDON SURGERY     2012,01/2017  . BACK SURGERY    . CARDIOVERSION N/A 09/07/2016   Procedure: CARDIOVERSION;  Surgeon: Minna Merritts, MD;  Location: [facility] ORS;  Service: Cardiovascular;  Laterality: N/A;  . CARDIOVERSION N/A 10/19/2016   Procedure: CARDIOVERSION;  Surgeon: Minna Merritts, MD;  Location: [facility] ORS;  Service: Cardiovascular;  Laterality: N/A;  . CARDIOVERSION N/A 12/14/2016   Procedure: CARDIOVERSION;  Surgeon: Minna Merritts, MD;  Location: [facility] ORS;  Service: Cardiovascular;  Laterality: N/A;  . COLONOSCOPY    . ESOPHAGOGASTRODUODENOSCOPY    . THYROIDECTOMY     thyroid lobectomy secondary to a benign nodule  . TOTAL HIP ARTHROPLASTY Right 08/21/2018   Procedure: TOTAL HIP ARTHROPLASTY ANTERIOR APPROACH-RIGHT CELL SAVER  REQUESTED;  Surgeon: [name], MD;  Location: [facility] ORS;  Service: Orthopedics;  Laterality: Right;    Family History  Problem Relation Age of Onset  . Cancer Sister 81       ovarian  . Depression Sister   . Cancer Father 12       colon  . Heart disease Father   . Heart attack Father 77       died from MI  . Hyperlipidemia Father   . Hypertension Father   . Cancer Mother        leukemia  . Breast cancer Paternal Aunt        26's  . Migraines Neg Hx     SOCIAL HX: Former smoker   Current Outpatient Medications:  .  ALPRAZolam (XANAX) 0.25 MG tablet, Take 1 tablet (0.25 mg total) by mouth daily  as needed for anxiety., Disp: 10 tablet, Rfl: 0 .  amiodarone (PACERONE) 200 MG tablet, Take 1 tablet (200 mg total) by mouth daily., Disp: 90 tablet, Rfl: 3 .  B Complex Vitamins (VITAMIN B COMPLEX PO), Take 1 tablet by mouth daily. , Disp: , Rfl:  .  bisoprolol (ZEBETA) 5 MG tablet, Take 0.5 tablets (2.5 mg total) by mouth daily., Disp: 90 tablet, Rfl: 1 .  calcium gluconate 500 MG tablet, Take 500 tablets by mouth daily., Disp: , Rfl:  .  Cholecalciferol (VITAMIN D3) 5000 units CAPS, Take 5,000 Units by mouth daily., Disp: , Rfl:  .  ELIQUIS 5 MG TABS tablet, TAKE 1 TABLET BY MOUTH TWICE A DAY, Disp: 60 tablet, Rfl: 3 .  esomeprazole (NEXIUM) 20 MG capsule, Take 20 mg by mouth daily as needed (acid reflux)., Disp: , Rfl:  .  furosemide (LASIX) 20 MG tablet, TAKE 1 TABLET (20 MG TOTAL) BY MOUTH 2 (TWO) TIMES DAILY., Disp: 180 tablet, Rfl: 3 .  gabapentin (NEURONTIN) 300 MG capsule, Take 1-3 capsules (300-900 mg total) by mouth 4 (four) times daily., Disp: 360 capsule, Rfl: 2 .  hydrALAZINE (APRESOLINE) 25 MG tablet, Take 1 tablet (25 mg total) by mouth 4 (four) times daily., Disp: 120 tablet, Rfl: 11 .  KLOR-CON 10 10 MEQ tablet, TAKE 1 TABLET BY MOUTH TWICE A DAY (Patient taking differently: Take 10 mEq by mouth daily. ), Disp: 180 tablet, Rfl: 2 .  losartan (COZAAR) 25 MG tablet, TAKE 1 TABLET BY MOUTH EVERY DAY (Patient taking differently: Take 25 mg by mouth daily. ), Disp: 90 tablet, Rfl: 2 .  meclizine (ANTIVERT) 12.5 MG tablet, Take 1 tablet (12.5 mg total) by mouth 3 (three) times daily as needed for dizziness., Disp: 30 tablet, Rfl: 0 .  methocarbamol (ROBAXIN) 500 MG tablet, Take 1 tablet (500 mg total) by mouth every 6 (six) hours as needed for muscle spasms., Disp: 30 tablet, Rfl: 0 .  Multiple Vitamin (MULTIVITAMIN) tablet, Take 1 tablet by mouth daily., Disp: , Rfl:  .  oxymetazoline (AFRIN) 0.05 % nasal spray, Place 1 spray into both nostrils at bedtime as needed for congestion. ,  Disp: , Rfl:  .  ranolazine (RANEXA) 500 MG 12 hr tablet, TAKE 1 TABLET BY MOUTH TWICE A DAY, Disp: 180 tablet, Rfl: 1 .  rizatriptan (MAXALT) 10 MG tablet, TAKE 1 TABLET (10 MG TOTAL) BY MOUTH AS NEEDED FOR MIGRAINE. MAY REPEAT IN 2 HOURS IF NEEDED, Disp: 10 tablet, Rfl: 0 .  rosuvastatin (CRESTOR) 20 MG tablet, TAKE 1 TABLET BY MOUTH EVERY DAY, Disp: 90 tablet, Rfl: 0 .  sertraline (ZOLOFT) 100 MG tablet, TAKE 1 TABLET BY MOUTH EVERY DAY, Disp: 90 tablet, Rfl: 2  EXAM:  VITALS per patient if applicable:  GENERAL: alert, oriented, appears well and in no acute distress  HEENT: atraumatic, conjunttiva clear, no obvious abnormalities on inspection of external nose and ears. No abnormal eye movements noted with inspection.   NECK: normal movements of the head and neck  LUNGS: on inspection no signs of respiratory distress, breathing rate appears normal, no obvious gross SOB, gasping or wheezing  CV: no obvious cyanosis  MS: moves all visible extremities without noticeable abnormality  PSYCH/NEURO: pleasant and cooperative, no obvious depression or anxiety, speech and thought processing grossly intact  ASSESSMENT AND PLAN:  Discussed the following assessment and plan:  Vertigo - Plan: meclizine (ANTIVERT) 12.5 MG tablet     I discussed the assessment and treatment plan with the patient. The patient was provided an opportunity to ask questions and all were answered. The patient agreed with the plan and demonstrated an understanding of the instructions.   The patient was advised to call back or seek an in-person evaluation if the symptoms worsen or if the condition fails to improve as anticipated.  I provided 25  minutes of non-face-to-face time during this encounter.   [name], FNP

## 2018-10-04 ENCOUNTER — Other Ambulatory Visit: Payer: Self-pay | Admitting: Cardiovascular Disease

## 2018-10-05 ENCOUNTER — Encounter: Payer: Self-pay | Admitting: Family

## 2018-10-05 NOTE — Assessment & Plan Note (Signed)
Discussed at length with patient the causes of dizziness as well as my working diagnosis of BPPV, however diagnosing is limited by inability to perform physical exam today. Advised patient to remain vigilant about triggers for dizziness as related to water intake, food and stress. Will give patient meclizine for trial, close follow-up.  Advised patient if any new or worsening symptoms present, she must be seen at higher level of care as vertigo can be a symptom of CVA.  Patient verbalized understanding of all of this.  She felt safe, comfortable to be managed at home for this.

## 2018-10-06 ENCOUNTER — Other Ambulatory Visit: Payer: Self-pay | Admitting: Psychiatry

## 2018-10-06 DIAGNOSIS — F4310 Post-traumatic stress disorder, unspecified: Secondary | ICD-10-CM

## 2018-10-06 DIAGNOSIS — F331 Major depressive disorder, recurrent, moderate: Secondary | ICD-10-CM

## 2018-10-08 ENCOUNTER — Telehealth: Payer: Self-pay

## 2018-10-08 NOTE — Telephone Encounter (Signed)
Virtual Visit Pre-Appointment Phone Call  Steps For Call:  1. Confirm consent - "In the setting of the current Covid19 crisis, you are scheduled for a phone visit with your provider on 10/23/2018 at 10:20AM.  Just as we do with many in-office visits, in order for you to participate in this visit, we must obtain consent.  If you'd like, I can send this to your mychart (if signed up) or email for you to review.  Otherwise, I can obtain your verbal consent now.  All virtual visits are billed to your insurance company just like a normal visit would be.  By agreeing to a virtual visit, we'd like you to understand that the technology does not allow for your provider to perform an examination, and thus may limit your provider's ability to fully assess your condition.  Finally, though the technology is pretty good, we cannot assure that it will always work on either your or our end, and in the setting of a video visit, we may have to convert it to a phone-only visit.  In either situation, we cannot ensure that we have a secure connection.  Are you willing to proceed?"  2. Give patient instructions for WebEx download to smartphone as below if video visit  3. Advise patient to be prepared with any vital sign or heart rhythm information, their current medicines, and a piece of paper and pen handy for any instructions they may receive the day of their visit  4. Inform patient they will receive a phone call 15 minutes prior to their appointment time (may be from unknown caller ID) so they should be prepared to answer  5. Confirm that appointment type is correct in Epic appointment notes (video vs telephone)    TELEPHONE CALL NOTE  Amber Barnes has been deemed a candidate for a follow-up tele-health visit to limit community exposure during the Covid-19 pandemic. I spoke with the patient via phone to ensure availability of phone/video source, confirm preferred email & phone number, and discuss  instructions and expectations.  I reminded Amber Barnes to be prepared with any vital sign and/or heart rhythm information that could potentially be obtained via home monitoring, at the time of her visit. I reminded Amber Barnes to expect a phone call at the time of her visit if her visit.  Did the patient verbally acknowledge consent to treatment? YES  Amber Barnes 10/08/2018 3:11 PM  CONSENT FOR TELE-HEALTH VISIT - PLEASE REVIEW  I hereby voluntarily request, consent and authorize [facility] and its employed or contracted physicians, physician assistants, nurse practitioners or other licensed health care professionals (the Practitioner), to provide me with telemedicine health care services (the Services") as deemed necessary by the treating Practitioner. I acknowledge and consent to receive the Services by the Practitioner via telemedicine. I understand that the telemedicine visit will involve communicating with the Practitioner through live audiovisual communication technology and the disclosure of certain medical information by electronic transmission. I acknowledge that I have been given the opportunity to request an in-person assessment or other available alternative prior to the telemedicine visit and am voluntarily participating in the telemedicine visit.  I understand that I have the right to withhold or withdraw my consent to the use of telemedicine in the course of my care at any time, without affecting my right to future care or treatment, and that the Practitioner or I may terminate the telemedicine visit at any time. I understand that I have the right to  inspect all information obtained and/or recorded in the course of the telemedicine visit and may receive copies of available information for a reasonable fee.  I understand that some of the potential risks of receiving the Services via telemedicine include:   Delay or interruption in medical evaluation due to  technological equipment failure or disruption;  Information transmitted may not be sufficient (e.g. poor resolution of images) to allow for appropriate medical decision making by the Practitioner; and/or   In rare instances, security protocols could fail, causing a breach of personal health information.  Furthermore, I acknowledge that it is my responsibility to provide information about my medical history, conditions and care that is complete and accurate to the best of my ability. I acknowledge that Practitioner's advice, recommendations, and/or decision may be based on factors not within their control, such as incomplete or inaccurate data provided by me or distortions of diagnostic images or specimens that may result from electronic transmissions. I understand that the practice of medicine is not an exact science and that Practitioner makes no warranties or guarantees regarding treatment outcomes. I acknowledge that I will receive a copy of this consent concurrently upon execution via email to the email address I last provided but may also request a printed copy by calling the office of [facility].    I understand that my insurance will be billed for this visit.   I have read or had this consent read to me.  I understand the contents of this consent, which adequately explains the benefits and risks of the Services being provided via telemedicine.   I have been provided ample opportunity to ask questions regarding this consent and the Services and have had my questions answered to my satisfaction.  I give my informed consent for the services to be provided through the use of telemedicine in my medical care  By participating in this telemedicine visit I agree to the above.

## 2018-10-10 ENCOUNTER — Telehealth (INDEPENDENT_AMBULATORY_CARE_PROVIDER_SITE_OTHER): Payer: Medicare Other | Admitting: Family

## 2018-10-10 ENCOUNTER — Encounter: Payer: Self-pay | Admitting: Family

## 2018-10-10 DIAGNOSIS — I10 Essential (primary) hypertension: Secondary | ICD-10-CM | POA: Diagnosis not present

## 2018-10-10 DIAGNOSIS — R42 Dizziness and giddiness: Secondary | ICD-10-CM | POA: Diagnosis not present

## 2018-10-10 NOTE — Progress Notes (Signed)
Since video, does not need time at bottom, can you remove or edit smart phrase so your video smartphrase does not have the time?  Otherwise looks good.  Thanks, [employer]

## 2018-10-10 NOTE — Assessment & Plan Note (Addendum)
Pleased as not worsened, some improvement with meclizine.  Very much based on HPI seems positional which patient has learned to manage with behavioral modifications.  Patient and  I did discuss at length that perhaps eustachian tube dysfunction on the left side is contributory.  She will do trial of Flonase, antihistamine.  She will let me know if no improvement.  If no improvement, we will consult ear nose and throat.

## 2018-10-10 NOTE — Progress Notes (Signed)
This visit type was conducted due to national recommendations for restrictions regarding the COVID-19 pandemic (e.g. social distancing).  This format is felt to be most appropriate for this patient at this time.  All issues noted in this document were discussed and addressed.  No physical exam was performed (except for noted visual exam findings with Video Visits). Interactive audio and video telecommunications were attempted between this provider and patient, however failed, due to patient having technical difficulties or patient did not have access to video capability.  We continued and completed visit with audio only.   Virtual Visit via Video Note  I connected with@ on 10/10/18 at 11:00 AM EDT by a video enabled telemedicine application and verified that I am speaking with the correct person using two identifiers.  Location patient: home Location provider:work  Persons participating in the virtual visit: patient, provider  I discussed the limitations of evaluation and management by telemedicine and the availability of in person appointments. The patient expressed understanding and agreed to proceed.   HPI: Has noted left ear 'ache' for past 2-3 days, unchanged. Describes as  Pressure. 'Not unbearable.'  Not sure if vertigo coming from 'inner ear.' Endorses post nasal drip. No sore throat, fever, cough, sob, ear drainage, cp, vision loss, left arm numbness.   hasnt tried an antihistamine.   Has tried peroxide in ear with some relief.   Vertigo- improved. Less frequent. Describes as 'sea sickness.' like on a boat. No nausea, syncope.  Worse when laying down and rolls over to left side.  given meclizine last week, with relief.   At home, BP 116/62, 126/64.   ROS: See pertinent positives and negatives per HPI.  Past Medical History:  Diagnosis Date  . Achilles tendinitis   . Acute medial meniscal tear   . Allergy   . Anxiety   . Arthritis   . Atrial fibrillation ([place])    a. new  onset 07/2016; b. CHADS2VASc --> 2 (HTN, female); c. eliquis  . CHF (congestive heart failure) ([place])   . Chronic lower back pain   . COPD (chronic obstructive pulmonary disease) ([place])   . Depression   . Fibrocystic breast disease   . GERD (gastroesophageal reflux disease)   . Hyperlipidemia   . Hypertension   . Insomnia   . Lipoma   . Migraine   . Osteoarthritis    left knee  . Osteopenia   . Sleep apnea    does not use her cpap  . Tendonitis   . Thyroid disease   . Vitamin D deficiency     Past Surgical History:  Procedure Laterality Date  . ABDOMINAL HYSTERECTOMY     ovaries left  . ACHILLES TENDON SURGERY     2012,01/2017  . BACK SURGERY    . CARDIOVERSION N/A 09/07/2016   Procedure: CARDIOVERSION;  Surgeon: Minna Merritts, MD;  Location: [facility] ORS;  Service: Cardiovascular;  Laterality: N/A;  . CARDIOVERSION N/A 10/19/2016   Procedure: CARDIOVERSION;  Surgeon: Minna Merritts, MD;  Location: [facility] ORS;  Service: Cardiovascular;  Laterality: N/A;  . CARDIOVERSION N/A 12/14/2016   Procedure: CARDIOVERSION;  Surgeon: Minna Merritts, MD;  Location: [facility] ORS;  Service: Cardiovascular;  Laterality: N/A;  . COLONOSCOPY    . ESOPHAGOGASTRODUODENOSCOPY    . THYROIDECTOMY     thyroid lobectomy secondary to a benign nodule  . TOTAL HIP ARTHROPLASTY Right 08/21/2018   Procedure: TOTAL HIP ARTHROPLASTY ANTERIOR APPROACH-RIGHT CELL SAVER REQUESTED;  Surgeon: [name], MD;  Location:  [facility] ORS;  Service: Orthopedics;  Laterality: Right;    Family History  Problem Relation Age of Onset  . Cancer Sister 5       ovarian  . Depression Sister   . Cancer Father 76       colon  . Heart disease Father   . Heart attack Father 69       died from MI  . Hyperlipidemia Father   . Hypertension Father   . Cancer Mother        leukemia  . Breast cancer Paternal Aunt        62's  . Migraines Neg Hx     SOCIAL HX: former smoker   Current Outpatient Medications:  .  ALPRAZolam  (XANAX) 0.25 MG tablet, Take 1 tablet (0.25 mg total) by mouth daily as needed for anxiety., Disp: 10 tablet, Rfl: 0 .  amiodarone (PACERONE) 200 MG tablet, Take 1 tablet (200 mg total) by mouth daily., Disp: 90 tablet, Rfl: 3 .  B Complex Vitamins (VITAMIN B COMPLEX PO), Take 1 tablet by mouth daily. , Disp: , Rfl:  .  bisoprolol (ZEBETA) 5 MG tablet, Take 0.5 tablets (2.5 mg total) by mouth daily., Disp: 90 tablet, Rfl: 1 .  calcium gluconate 500 MG tablet, Take 500 tablets by mouth daily., Disp: , Rfl:  .  Cholecalciferol (VITAMIN D3) 5000 units CAPS, Take 5,000 Units by mouth daily., Disp: , Rfl:  .  ELIQUIS 5 MG TABS tablet, TAKE 1 TABLET BY MOUTH TWICE A DAY, Disp: 60 tablet, Rfl: 3 .  esomeprazole (NEXIUM) 20 MG capsule, Take 20 mg by mouth daily as needed (acid reflux)., Disp: , Rfl:  .  furosemide (LASIX) 20 MG tablet, TAKE 1 TABLET (20 MG TOTAL) BY MOUTH 2 (TWO) TIMES DAILY., Disp: 180 tablet, Rfl: 3 .  gabapentin (NEURONTIN) 300 MG capsule, Take 1-3 capsules (300-900 mg total) by mouth 4 (four) times daily., Disp: 360 capsule, Rfl: 2 .  hydrALAZINE (APRESOLINE) 25 MG tablet, Take 1 tablet (25 mg total) by mouth 4 (four) times daily., Disp: 120 tablet, Rfl: 11 .  KLOR-CON 10 10 MEQ tablet, TAKE 1 TABLET BY MOUTH TWICE A DAY (Patient taking differently: Take 10 mEq by mouth daily. ), Disp: 180 tablet, Rfl: 2 .  losartan (COZAAR) 25 MG tablet, TAKE 1 TABLET BY MOUTH EVERY DAY (Patient taking differently: Take 25 mg by mouth daily. ), Disp: 90 tablet, Rfl: 2 .  meclizine (ANTIVERT) 12.5 MG tablet, Take 1 tablet (12.5 mg total) by mouth 3 (three) times daily as needed for dizziness., Disp: 30 tablet, Rfl: 0 .  methocarbamol (ROBAXIN) 500 MG tablet, Take 1 tablet (500 mg total) by mouth every 6 (six) hours as needed for muscle spasms., Disp: 30 tablet, Rfl: 0 .  Multiple Vitamin (MULTIVITAMIN) tablet, Take 1 tablet by mouth daily., Disp: , Rfl:  .  oxymetazoline (AFRIN) 0.05 % nasal spray, Place  1 spray into both nostrils at bedtime as needed for congestion. , Disp: , Rfl:  .  ranolazine (RANEXA) 500 MG 12 hr tablet, TAKE 1 TABLET BY MOUTH TWICE A DAY, Disp: 180 tablet, Rfl: 1 .  rizatriptan (MAXALT) 10 MG tablet, TAKE 1 TABLET (10 MG TOTAL) BY MOUTH AS NEEDED FOR MIGRAINE. MAY REPEAT IN 2 HOURS IF NEEDED, Disp: 10 tablet, Rfl: 0 .  rosuvastatin (CRESTOR) 20 MG tablet, TAKE 1 TABLET BY MOUTH EVERY DAY, Disp: 90 tablet, Rfl: 0 .  sertraline (ZOLOFT) 100 MG tablet, TAKE 1 TABLET  BY MOUTH EVERY DAY, Disp: 90 tablet, Rfl: 2  EXAM:  VITALS per patient if applicable:  GENERAL: alert, oriented, appears well and in no acute distress  HEENT: atraumatic, conjunttiva clear, no obvious abnormalities on inspection of external nose and ears  NECK: normal movements of the head and neck  LUNGS: on inspection no signs of respiratory distress, breathing rate appears normal, no obvious gross SOB, gasping or wheezing  CV: no obvious cyanosis  MS: moves all visible extremities without noticeable abnormality  PSYCH/NEURO: pleasant and cooperative, no obvious depression or anxiety, speech and thought processing grossly intact  ASSESSMENT AND PLAN:  Discussed the following assessment and plan:  No diagnosis found.     I discussed the assessment and treatment plan with the patient. The patient was provided an opportunity to ask questions and all were answered. The patient agreed with the plan and demonstrated an understanding of the instructions.   The patient was advised to call back or seek an in-person evaluation if the symptoms worsen or if the condition fails to improve as anticipated.  I provided 15 minutes of non-face-to-face time during this encounter.   [name], FNP

## 2018-10-10 NOTE — Patient Instructions (Addendum)
Start anti histamine, Flonase.  Let me know if this improves congestion, right ear pressure is this may be contributing to your vertigo. You may take orthostatic blood pressure at home as we discussed at length over the phone.  Please call me with these readings.  Please ensure you include blood pressure, and heart rate.  It does sound like you do have positional vertigo at this time however I want you to stay extremely vigilant for any new or worsening symptoms.    Please not hesitate to call the office at any time.  If vertigo does not resolve or certainly if worsens, I would like for you to see ear nose and throat.  Please let me know at any point if you like this referral.

## 2018-10-10 NOTE — Assessment & Plan Note (Signed)
At goal, continue current regimen 

## 2018-10-12 NOTE — Progress Notes (Signed)
For some reason I cannot addend. Not sure why on this one  Just wanted you to be aware

## 2018-10-17 ENCOUNTER — Other Ambulatory Visit: Payer: Self-pay | Admitting: *Deleted

## 2018-10-17 MED ORDER — AMIODARONE HCL 200 MG PO TABS: 200.0000 mg | ORAL_TABLET | Freq: Every day | ORAL | 0 refills | Status: DC

## 2018-10-22 ENCOUNTER — Telehealth: Payer: Self-pay

## 2018-10-22 NOTE — Telephone Encounter (Signed)
Spoke with patient.  Reviewed how tomorrows video visit will go.  Reviewed medications and Allergies with patient. Patient did not want to do a Doxy Trial.

## 2018-10-22 NOTE — Progress Notes (Addendum)
Virtual Visit via Video Note   This visit type was conducted due to national recommendations for restrictions regarding the COVID-19 Pandemic (e.g. social distancing) in an effort to limit this patient's exposure and mitigate transmission in our community.  Due to her co-morbid illnesses, this patient is at least at moderate risk for complications without adequate follow up.  This format is felt to be most appropriate for this patient at this time.  All issues noted in this document were discussed and addressed.  A limited physical exam was performed with this format.  Please refer to the patient's chart for her consent to telehealth for [facility].   Date:  10/22/2018   ID:  Amber Barnes, DOB 1950/07/20, MRN [id number]  Patient Location:  [place] [address]   Provider location:   [facility], [place] office  PCP:  [name], FNP  Cardiologist:  Arvid Right [facility]  Chief Complaint:  Leg weakness, recovering from hip surgery Chronic mild shortness of breath    History of Present Illness:    Amber Barnes is a 68 y.o. female who presents via audio/video conferencing for a telehealth visit today.   The patient does not symptoms concerning for COVID-19 infection (fever, chills, cough, or new SHORTNESS OF BREATH).   Patient has a past medical history of Paroxysmal atrial fibrillation Prior cardioversion obesity,  arthritis,  hyperlipidemia,  hypertension,  OSA on CPAP Migraines, anxiety Stress test January 2018 showing no ischemia, Ejection fraction 55-65% who presents for follow-up of her hyperlipidemia, hypertension, persistent atrial fibrillation  Holding NSR since mid 2018  Discussed recent events with her Feb 11,2020 Hip surgery "reaction anesthesia, drop in pressure" "Everything good now" Walks with walker  Last ekg 04/2018: EKG with NSR  Sleep ok Pressure left ear, into jaw Thought it was vertigo, now  thinks it is something else  taking amiodarone 200 daily No regular exercise program Walking for rehab for her hip Slow recovery  Cholesterol at goal, numbers reviewed with her in detail  Other past medical history reviewed cardioversion procedure 09/07/2016 Normal sinus rhythm restored at that time Back into atrial fibrillation on last hospital visit March 13  Flecainide held, on amiodarone Repeat cardioversion 10/19/2016 Normal sinus rhythm restored  Recurrent atrial fib Seen by Dr. [name], Started on ranexa and continued on amiodarone She converted to normal sinus rhythm on her own, did not need DCCV  In the hospital for foot surgery, this was delayed as she was found to be in atrial fibrillation Admitted to the hospital 07/27/2016 Started on diltiazem, beta blocker, anticoagulation  Back in hospsital 08/10/2016 Hospital records reviewed She wasvery anxious about palpitations Developed chest discomfort, tightness Medication doses were increased for better rate control Lasix in the emergency room for shortness of breath Also given DuoNeb's  She reports having a prior smoking history for 5 years, then smoked only small amounts    Prior CV studies:   The following studies were reviewed today:    Past Medical History:  Diagnosis Date  . Achilles tendinitis   . Acute medial meniscal tear   . Allergy   . Anxiety   . Arthritis   . Atrial fibrillation ([place])    a. new onset 07/2016; b. CHADS2VASc --> 2 (HTN, female); c. eliquis  . CHF (congestive heart failure) ([place])   . Chronic lower back pain   . COPD (chronic obstructive pulmonary disease) ([place])   . Depression   . Fibrocystic breast  disease   . GERD (gastroesophageal reflux disease)   . Hyperlipidemia   . Hypertension   . Insomnia   . Lipoma   . Migraine   . Osteoarthritis    left knee  . Osteopenia   . Sleep apnea    does not use her cpap  . Tendonitis   . Thyroid disease   . Vitamin D deficiency     Past Surgical History:  Procedure Laterality Date  . ABDOMINAL HYSTERECTOMY     ovaries left  . ACHILLES TENDON SURGERY     2012,01/2017  . BACK SURGERY    . CARDIOVERSION N/A 09/07/2016   Procedure: CARDIOVERSION;  Surgeon: Minna Merritts, MD;  Location: [facility] ORS;  Service: Cardiovascular;  Laterality: N/A;  . CARDIOVERSION N/A 10/19/2016   Procedure: CARDIOVERSION;  Surgeon: Minna Merritts, MD;  Location: [facility] ORS;  Service: Cardiovascular;  Laterality: N/A;  . CARDIOVERSION N/A 12/14/2016   Procedure: CARDIOVERSION;  Surgeon: Minna Merritts, MD;  Location: [facility] ORS;  Service: Cardiovascular;  Laterality: N/A;  . COLONOSCOPY    . ESOPHAGOGASTRODUODENOSCOPY    . THYROIDECTOMY     thyroid lobectomy secondary to a benign nodule  . TOTAL HIP ARTHROPLASTY Right 08/21/2018   Procedure: TOTAL HIP ARTHROPLASTY ANTERIOR APPROACH-RIGHT CELL SAVER REQUESTED;  Surgeon: [name], MD;  Location: [facility] ORS;  Service: Orthopedics;  Laterality: Right;     No outpatient medications have been marked as taking for the 10/23/18 encounter (Appointment) with Minna Merritts, MD.     Allergies:   Prednisone; Hydrocodone-acetaminophen; Oxycodone; Tramadol; Adhesive [tape]; and Latex   Social History   Tobacco Use  . Smoking status: Former Smoker    Packs/day: 0.25    Years: 10.00    Pack years: 2.50    Types: Cigarettes    Last attempt to quit: 2005    Years since quitting: 15.2  . Smokeless tobacco: Never Used  . Tobacco comment: smoked for 10 years, 1 pack every 2-3 days  Substance Use Topics  . Alcohol use: Not Currently    Comment: social, occasional  . Drug use: No     Current Outpatient Medications on File Prior to Visit  Medication Sig Dispense Refill  . ALPRAZolam (XANAX) 0.25 MG tablet Take 1 tablet (0.25 mg total) by mouth daily as needed for anxiety. 10 tablet 0  . amiodarone (PACERONE) 200 MG tablet Take 1 tablet (200 mg total) by mouth daily. 90 tablet 0  . B Complex  Vitamins (VITAMIN B COMPLEX PO) Take 1 tablet by mouth daily.     . bisoprolol (ZEBETA) 5 MG tablet Take 0.5 tablets (2.5 mg total) by mouth daily. 90 tablet 1  . calcium gluconate 500 MG tablet Take 500 tablets by mouth daily.    . Cholecalciferol (VITAMIN D3) 5000 units CAPS Take 5,000 Units by mouth daily.    Marland Kitchen ELIQUIS 5 MG TABS tablet TAKE 1 TABLET BY MOUTH TWICE A DAY 60 tablet 3  . esomeprazole (NEXIUM) 20 MG capsule Take 20 mg by mouth daily as needed (acid reflux).    . furosemide (LASIX) 20 MG tablet TAKE 1 TABLET (20 MG TOTAL) BY MOUTH 2 (TWO) TIMES DAILY. 180 tablet 3  . gabapentin (NEURONTIN) 300 MG capsule Take 1-3 capsules (300-900 mg total) by mouth 4 (four) times daily. 360 capsule 2  . hydrALAZINE (APRESOLINE) 25 MG tablet Take 1 tablet (25 mg total) by mouth 4 (four) times daily. 120 tablet 11  . KLOR-CON 10 10 MEQ  tablet TAKE 1 TABLET BY MOUTH TWICE A DAY (Patient taking differently: Take 10 mEq by mouth daily. ) 180 tablet 2  . losartan (COZAAR) 25 MG tablet TAKE 1 TABLET BY MOUTH EVERY DAY (Patient taking differently: Take 25 mg by mouth daily. ) 90 tablet 2  . meclizine (ANTIVERT) 12.5 MG tablet Take 1 tablet (12.5 mg total) by mouth 3 (three) times daily as needed for dizziness. 30 tablet 0  . methocarbamol (ROBAXIN) 500 MG tablet Take 1 tablet (500 mg total) by mouth every 6 (six) hours as needed for muscle spasms. 30 tablet 0  . Multiple Vitamin (MULTIVITAMIN) tablet Take 1 tablet by mouth daily.    Marland Kitchen oxymetazoline (AFRIN) 0.05 % nasal spray Place 1 spray into both nostrils at bedtime as needed for congestion.     . ranolazine (RANEXA) 500 MG 12 hr tablet TAKE 1 TABLET BY MOUTH TWICE A DAY 180 tablet 1  . rizatriptan (MAXALT) 10 MG tablet TAKE 1 TABLET (10 MG TOTAL) BY MOUTH AS NEEDED FOR MIGRAINE. MAY REPEAT IN 2 HOURS IF NEEDED 10 tablet 0  . rosuvastatin (CRESTOR) 20 MG tablet TAKE 1 TABLET BY MOUTH EVERY DAY 90 tablet 0  . sertraline (ZOLOFT) 100 MG tablet TAKE 1 TABLET  BY MOUTH EVERY DAY 90 tablet 2   No current facility-administered medications on file prior to visit.      Family Hx: The patient's family history includes Breast cancer in her paternal aunt; Cancer in her mother; Cancer (age of onset: 74) in her sister; Cancer (age of onset: 49) in her father; Depression in her sister; Heart attack (age of onset: 67) in her father; Heart disease in her father; Hyperlipidemia in her father; Hypertension in her father. There is no history of Migraines.  ROS:   Please see the history of present illness.    Review of Systems  Constitutional: Negative.   Respiratory: Positive for shortness of breath.   Cardiovascular: Negative.   Gastrointestinal: Negative.   Musculoskeletal: Positive for joint pain.       Gait instability, walking with a walker  Neurological: Negative.   Psychiatric/Behavioral: Negative.   All other systems reviewed and are negative.    Labs/Other Tests and Data Reviewed:    Recent Labs: 02/27/2018: Magnesium 2.1 04/03/2018: TSH 4.50 07/25/2018: ALT 30 08/25/2018: BUN 13; Creatinine, Ser 0.63; Hemoglobin 10.0; Platelets 167; Potassium 4.1; Sodium 137   Recent Lipid Panel Lab Results  Component Value Date/Time   CHOL 163 04/03/2018 08:00 AM   TRIG 97.0 04/03/2018 08:00 AM   HDL 69.20 04/03/2018 08:00 AM   CHOLHDL 2 04/03/2018 08:00 AM   LDLCALC 74 04/03/2018 08:00 AM    Wt Readings from Last 3 Encounters:  08/21/18 (!) 322 lb 9.6 oz (146.3 kg)  08/08/18 (!) 322 lb 9.6 oz (146.3 kg)  07/25/18 (!) 324 lb (147 kg)     Exam:    Vital Signs: Vital signs may also be detailed in the HPI There were no vitals taken for this visit.  Wt Readings from Last 3 Encounters:  08/21/18 (!) 322 lb 9.6 oz (146.3 kg)  08/08/18 (!) 322 lb 9.6 oz (146.3 kg)  07/25/18 (!) 324 lb (147 kg)   Temp Readings from Last 3 Encounters:  09/18/18 97.8 F (36.6 C) (Oral)  09/10/18 98.1 F (36.7 C)  08/25/18 98.1 F (36.7 C) (Oral)   BP Readings  from Last 3 Encounters:  09/18/18 (!) 142/88  09/10/18 102/64  08/25/18 (!) 157/64  Pulse Readings from Last 3 Encounters:  09/18/18 65  09/10/18 77  08/25/18 69     Blood pressure good: 116/60 - 130/70, Pulse 50 to 60, regular resp 16  Well nourished, well developed female in no acute distress. Constitutional:  oriented to person, place, and time. No distress.  Head: Normocephalic and atraumatic.  Eyes:  no discharge. No scleral icterus.  Neck: Normal range of motion. Neck supple.  Pulmonary/Chest: No audible wheezing, no distress, appears comfortable Musculoskeletal: Normal range of motion.  no  tenderness or deformity.  Neurological:   Coordination normal. Full exam not performed Skin:  No rash Psychiatric:  normal mood and affect. behavior is normal. Thought content normal.    ASSESSMENT & PLAN:    Syncope No further spells No further work-up at this time  Anxiety Better Doing well, thinks everything is stable  Mixed hyperlipidemia Cholesterol is at goal on the current lipid regimen. No changes to the medications were made. Stable  Essential hypertension Recommend she stop hydralazine 4 times a day Increase losartan up to 100 mg daily Monitor blood pressure and call us if numbers run low  Atrial fibrillation with RVR (HCC) Suspect maintaining normal sinus rhythm No changes made to her medications  SOB (shortness of breath) Chronic issue likely secondary to obesity, deconditioning Recommended weight loss  Morbid obesity (HCC) We have encouraged continued exercise, careful diet management in an effort to lose weight.  Acute pulmonary edema (HCC) Recommend she continue on her Lasix, modify her salt and fluid intake   COVID-19 Education: The signs and symptoms of COVID-19 were discussed with the patient and how to seek care for testing (follow up with PCP or arrange E-visit).  The importance of social distancing was discussed today.  Patient  Risk:   After full review of this patients clinical status, I feel that they are at least moderate risk at this time.  Time:   Today, I have spent 25 minutes with the patient with telehealth technology discussing the cardiac and medical problems/diagnoses detailed above     Medication Adjustments/Labs and Tests Ordered: Current medicines are reviewed at length with the patient today.  Concerns regarding medicines are outlined above.   Tests Ordered: No tests ordered   Medication Changes: Changes as above Will come off the hydralazine increase losartan   Disposition: Follow-up in 6 months   Signed, Ida Rogue, MD  10/22/2018 3:46 PM    [place] [facility] [address] [place] [address], [place], [place] [address]

## 2018-10-23 ENCOUNTER — Other Ambulatory Visit: Payer: Self-pay

## 2018-10-23 ENCOUNTER — Telehealth (INDEPENDENT_AMBULATORY_CARE_PROVIDER_SITE_OTHER): Payer: Medicare Other | Admitting: Cardiovascular Disease

## 2018-10-23 DIAGNOSIS — R0602 Shortness of breath: Secondary | ICD-10-CM

## 2018-10-23 DIAGNOSIS — I4891 Unspecified atrial fibrillation: Secondary | ICD-10-CM | POA: Diagnosis not present

## 2018-10-23 DIAGNOSIS — I10 Essential (primary) hypertension: Secondary | ICD-10-CM | POA: Diagnosis not present

## 2018-10-23 DIAGNOSIS — E66813 Obesity, class 3: Secondary | ICD-10-CM

## 2018-10-23 DIAGNOSIS — I700 Atherosclerosis of aorta: Secondary | ICD-10-CM

## 2018-10-23 NOTE — Patient Instructions (Addendum)
Medication Instructions:  Your physician has recommended you make the following change in your medication:  1. STOP Hydralazine 2. START Losartan 100 mg once daily  If you need a refill on your cardiac medications before your next appointment, please call your pharmacy.    Lab work: No new labs needed   If you have labs (blood work) drawn today and your tests are completely normal, you will receive your results only by: Marland Kitchen MyChart Message (if you have MyChart) OR . A paper copy in the mail If you have any lab test that is abnormal or we need to change your treatment, we will call you to review the results.   Testing/Procedures: No new testing needed   Follow-Up: At [facility], you and your health needs are our priority.  As part of our continuing mission to provide you with exceptional heart care, we have created designated Provider Care Teams.  These Care Teams include your primary Cardiologist (physician) and Advanced Practice Providers (APPs -  Physician Assistants and Nurse Practitioners) who all work together to provide you with the care you need, when you need it.  . You will need a follow up appointment in 6 months .   Please call our office 2 months in advance to schedule this appointment.    . Providers on your designated Care Team:   . Murray Hodgkins, NP . Christell Faith, PA-C . Marrianne Mood, PA-C  Any Other Special Instructions Will Be Listed Below (If Applicable).  For educational health videos Log in to : [email or web address] Or : [email or web address], password : [email or web address]

## 2018-10-25 ENCOUNTER — Telehealth: Payer: Self-pay | Admitting: Cardiovascular Disease

## 2018-10-25 MED ORDER — LOSARTAN POTASSIUM 100 MG PO TABS: 100.0000 mg | ORAL_TABLET | Freq: Every day | ORAL | 3 refills | Status: DC

## 2018-10-25 NOTE — Telephone Encounter (Signed)
Per Dr.Gollan's 10/23/18 telemedicne visit. Increase losartan up to 100 mg daily Monitor blood pressure and call us if numbers run low Rx was not sent in. Losartan 100mg  daily #90 R-3 sent to [facility] in [place].

## 2018-10-25 NOTE — Telephone Encounter (Signed)
Pt c/o medication issue:  1. Name of Medication: losartan   2. How are you currently taking this medication (dosage and times per day)? 100 mg ?  3. Are you having a reaction (difficulty breathing--STAT)? No   4. What is your medication issue? Dr. [name] changed dose at [facility] and the pharmacy does not have the new rx [facility] [place]

## 2018-11-07 ENCOUNTER — Other Ambulatory Visit: Payer: Self-pay | Admitting: Cardiovascular Disease

## 2018-11-08 ENCOUNTER — Telehealth: Payer: Self-pay | Admitting: *Deleted

## 2018-11-08 DIAGNOSIS — I10 Essential (primary) hypertension: Secondary | ICD-10-CM

## 2018-11-08 NOTE — Telephone Encounter (Signed)
Please place future lab orders for lab appt on Monday.  Thanks

## 2018-11-12 ENCOUNTER — Other Ambulatory Visit (INDEPENDENT_AMBULATORY_CARE_PROVIDER_SITE_OTHER): Payer: Medicare Other

## 2018-11-12 ENCOUNTER — Encounter: Payer: Self-pay | Admitting: Family

## 2018-11-12 ENCOUNTER — Other Ambulatory Visit: Payer: Self-pay

## 2018-11-12 ENCOUNTER — Other Ambulatory Visit: Payer: Self-pay | Admitting: Family

## 2018-11-12 DIAGNOSIS — I10 Essential (primary) hypertension: Secondary | ICD-10-CM | POA: Diagnosis not present

## 2018-11-12 DIAGNOSIS — R899 Unspecified abnormal finding in specimens from other organs, systems and tissues: Secondary | ICD-10-CM

## 2018-11-12 LAB — COMPREHENSIVE METABOLIC PANEL
ALT: 24 U/L (ref 0–35)
AST: 25 U/L (ref 0–37)
Albumin: 4.1 g/dL (ref 3.5–5.2)
Alkaline Phosphatase: 93 U/L (ref 39–117)
BUN: 16 mg/dL (ref 6–23)
CO2: 28 mEq/L (ref 19–32)
Calcium: 8.9 mg/dL (ref 8.4–10.5)
Chloride: 102 mEq/L (ref 96–112)
Creatinine, Ser: 0.91 mg/dL (ref 0.40–1.20)
GFR: 61.86 mL/min (ref >60.00)
Glucose, Bld: 89 mg/dL (ref 70–99)
Potassium: 4.4 mEq/L (ref 3.5–5.1)
Sodium: 140 mEq/L (ref 135–145)
Total Bilirubin: 0.6 mg/dL (ref 0.2–1.2)
Total Protein: 7 g/dL (ref 6.0–8.3)

## 2018-11-12 LAB — CBC WITH DIFFERENTIAL/PLATELET
Basophils Absolute: 0 10*3/uL (ref 0.0–0.1)
Basophils Relative: 0.8 % (ref 0.0–3.0)
Eosinophils Absolute: 0.1 10*3/uL (ref 0.0–0.7)
Eosinophils Relative: 2.1 % (ref 0.0–5.0)
HCT: 38.6 % (ref 36.0–46.0)
Hemoglobin: 12.9 g/dL (ref 12.0–15.0)
Lymphocytes Relative: 28.6 % (ref 12.0–46.0)
Lymphs Abs: 1.6 10*3/uL (ref 0.7–4.0)
MCHC: 33.4 g/dL (ref 30.0–36.0)
MCV: 88.3 fl (ref 78.0–100.0)
Monocytes Absolute: 0.5 10*3/uL (ref 0.1–1.0)
Monocytes Relative: 9 % (ref 3.0–12.0)
Neutro Abs: 3.3 10*3/uL (ref 1.4–7.7)
Neutrophils Relative %: 59.5 % (ref 43.0–77.0)
Platelets: 186 10*3/uL (ref 150.0–400.0)
RBC: 4.37 Mil/uL (ref 3.87–5.11)
RDW: 14.7 % (ref 11.5–15.5)
WBC: 5.6 10*3/uL (ref 4.0–10.5)

## 2018-11-12 LAB — LIPID PANEL
Cholesterol: 182 mg/dL (ref 0–200)
HDL: 70.8 mg/dL (ref >39.00)
LDL Cholesterol: 89 mg/dL (ref 0–99)
NonHDL: 111.64
Total CHOL/HDL Ratio: 3
Triglycerides: 111 mg/dL (ref 0.0–149.0)
VLDL: 22.2 mg/dL (ref 0.0–40.0)

## 2018-11-12 LAB — VITAMIN D 25 HYDROXY (VIT D DEFICIENCY, FRACTURES): VITD: 42.27 ng/mL (ref 30.00–100.00)

## 2018-11-12 LAB — HEMOGLOBIN A1C: Hgb A1c MFr Bld: 5.1 % (ref 4.6–6.5)

## 2018-11-12 LAB — TSH: TSH: 4.54 u[IU]/mL — ABNORMAL HIGH (ref 0.35–4.50)

## 2018-11-14 ENCOUNTER — Encounter: Payer: Self-pay | Admitting: Family

## 2018-11-14 ENCOUNTER — Ambulatory Visit (INDEPENDENT_AMBULATORY_CARE_PROVIDER_SITE_OTHER): Payer: Medicare Other | Admitting: Family

## 2018-11-14 DIAGNOSIS — R5383 Other fatigue: Secondary | ICD-10-CM

## 2018-11-14 DIAGNOSIS — F329 Major depressive disorder, single episode, unspecified: Secondary | ICD-10-CM

## 2018-11-14 DIAGNOSIS — Z6841 Body Mass Index (BMI) 40.0 and over, adult: Secondary | ICD-10-CM

## 2018-11-14 DIAGNOSIS — R609 Edema, unspecified: Secondary | ICD-10-CM | POA: Insufficient documentation

## 2018-11-14 DIAGNOSIS — F32A Depression, unspecified: Secondary | ICD-10-CM

## 2018-11-14 DIAGNOSIS — R42 Dizziness and giddiness: Secondary | ICD-10-CM

## 2018-11-14 DIAGNOSIS — F419 Anxiety disorder, unspecified: Secondary | ICD-10-CM | POA: Diagnosis not present

## 2018-11-14 DIAGNOSIS — I10 Essential (primary) hypertension: Secondary | ICD-10-CM

## 2018-11-14 NOTE — Progress Notes (Signed)
This visit type was conducted due to national recommendations for restrictions regarding the COVID-19 pandemic (e.g. social distancing).  This format is felt to be most appropriate for this patient at this time.  All issues noted in this document were discussed and addressed.  No physical exam was performed (except for noted visual exam findings with Video Visits). Virtual Visit via Video Note  I connected with@  on 11/14/18 at 11:00 AM EDT by a video enabled telemedicine application and verified that I am speaking with the correct person using two identifiers.  Location patient: home Location provider:work  Persons participating in the virtual visit: patient, provider  I discussed the limitations of evaluation and management by telemedicine and the availability of in person appointments. The patient expressed understanding and agreed to proceed.   HPI:   CC: fatigue x 2-3 weeks, improving.  Thinks improving because of walking and doing more around house.  Sleeping well. Naps.   No fever, cough.   Concerns for thyroid disease. H/o partial thyroidectomy.   Also complains of chronic ankle swelling in bilateral feet , which had worsened over last week. Lasix works well. Taking one lasix daily.  Resolved this morning after sleeping with elevation. Activity level has decreased, walking outside QOD 20 minutes and trying to increase to daily. Used compression stockings in the past.  Legs are symmetrical. NO increased redness, heat. No weeping, breaks in the skin.  No cp, orthopnea.  SOB at baseline.   Has gained 'weight all back. ' 334 lb.  Afib - on pacerone, eliquis. No palpitations.   HTN- today 121/67. Doing well on new regimen. off of hydralazine, increased losartan  Depression- worries about health. On zoloft 100mg  and it 'working'. No Si/hi. hasnt used xanax in a 'while.' Has seen counselor in the past.    Vertigo and left ear pain- resolved. flonase, antihistamine. no syncope.    Dr [name] 10/23/18- reviewed note  Dr Dossie Arbour referred her to bariatrics surgery, whom she never saw. No interest at home.     ROS: See pertinent positives and negatives per HPI.  Past Medical History:  Diagnosis Date  . Achilles tendinitis   . Acute medial meniscal tear   . Allergy   . Anxiety   . Arthritis   . Atrial fibrillation ([place])    a. new onset 07/2016; b. CHADS2VASc --> 2 (HTN, female); c. eliquis  . CHF (congestive heart failure) ([place])   . Chronic lower back pain   . COPD (chronic obstructive pulmonary disease) ([place])   . Depression   . Fibrocystic breast disease   . GERD (gastroesophageal reflux disease)   . Hyperlipidemia   . Hypertension   . Insomnia   . Lipoma   . Migraine   . Osteoarthritis    left knee  . Osteopenia   . Sleep apnea    does not use her cpap  . Tendonitis   . Thyroid disease   . Vitamin D deficiency     Past Surgical History:  Procedure Laterality Date  . ABDOMINAL HYSTERECTOMY     ovaries left  . ACHILLES TENDON SURGERY     2012,01/2017  . BACK SURGERY    . CARDIOVERSION N/A 09/07/2016   Procedure: CARDIOVERSION;  Surgeon: Minna Merritts, MD;  Location: [facility];  Service: Cardiovascular;  Laterality: N/A;  . CARDIOVERSION N/A 10/19/2016   Procedure: CARDIOVERSION;  Surgeon: Minna Merritts, MD;  Location: [facility];  Service: Cardiovascular;  Laterality: N/A;  . CARDIOVERSION N/A 12/14/2016  Procedure: CARDIOVERSION;  Surgeon: Minna Merritts, MD;  Location: [facility];  Service: Cardiovascular;  Laterality: N/A;  . COLONOSCOPY    . ESOPHAGOGASTRODUODENOSCOPY    . THYROIDECTOMY     thyroid lobectomy secondary to a benign nodule  . TOTAL HIP ARTHROPLASTY Right 08/21/2018   Procedure: TOTAL HIP ARTHROPLASTY ANTERIOR APPROACH-RIGHT CELL SAVER REQUESTED;  Surgeon: [name], MD;  Location: [facility];  Service: Orthopedics;  Laterality: Right;    Family History  Problem Relation Age of Onset  . Cancer Sister 22       ovarian   . Depression Sister   . Cancer Father 9       colon  . Heart disease Father   . Heart attack Father 65       died from MI  . Hyperlipidemia Father   . Hypertension Father   . Cancer Mother        leukemia  . Breast cancer Paternal Aunt        48's  . Migraines Neg Hx     SOCIAL HX: former smoker   Current Outpatient Medications:  .  ALPRAZolam (XANAX) 0.25 MG tablet, Take 1 tablet (0.25 mg total) by mouth daily as needed for anxiety., Disp: 10 tablet, Rfl: 0 .  amiodarone (PACERONE) 200 MG tablet, Take 1 tablet (200 mg total) by mouth daily., Disp: 90 tablet, Rfl: 0 .  B Complex Vitamins (VITAMIN B COMPLEX PO), Take 1 tablet by mouth daily. , Disp: , Rfl:  .  bisoprolol (ZEBETA) 5 MG tablet, Take 0.5 tablets (2.5 mg total) by mouth daily., Disp: 90 tablet, Rfl: 1 .  calcium gluconate 500 MG tablet, Take 500 tablets by mouth daily., Disp: , Rfl:  .  Cholecalciferol (VITAMIN D3) 5000 units CAPS, Take 5,000 Units by mouth daily., Disp: , Rfl:  .  ELIQUIS 5 MG TABS tablet, TAKE 1 TABLET BY MOUTH TWICE A DAY, Disp: 60 tablet, Rfl: 3 .  esomeprazole (NEXIUM) 20 MG capsule, Take 20 mg by mouth daily as needed (acid reflux)., Disp: , Rfl:  .  furosemide (LASIX) 20 MG tablet, TAKE 1 TABLET (20 MG TOTAL) BY MOUTH 2 (TWO) TIMES DAILY., Disp: 180 tablet, Rfl: 3 .  KLOR-CON 10 10 MEQ tablet, TAKE 1 TABLET BY MOUTH TWICE A DAY (Patient taking differently: Take 10 mEq by mouth daily. ), Disp: 180 tablet, Rfl: 2 .  losartan (COZAAR) 100 MG tablet, Take 1 tablet (100 mg total) by mouth daily., Disp: 90 tablet, Rfl: 3 .  Multiple Vitamin (MULTIVITAMIN) tablet, Take 1 tablet by mouth daily., Disp: , Rfl:  .  oxymetazoline (AFRIN) 0.05 % nasal spray, Place 1 spray into both nostrils at bedtime as needed for congestion. , Disp: , Rfl:  .  ranolazine (RANEXA) 500 MG 12 hr tablet, TAKE 1 TABLET BY MOUTH TWICE A DAY, Disp: 60 tablet, Rfl: 5 .  rizatriptan (MAXALT) 10 MG tablet, TAKE 1 TABLET (10 MG  TOTAL) BY MOUTH AS NEEDED FOR MIGRAINE. MAY REPEAT IN 2 HOURS IF NEEDED, Disp: 10 tablet, Rfl: 0 .  rosuvastatin (CRESTOR) 20 MG tablet, TAKE 1 TABLET BY MOUTH EVERY DAY, Disp: 90 tablet, Rfl: 0 .  sertraline (ZOLOFT) 100 MG tablet, TAKE 1 TABLET BY MOUTH EVERY DAY, Disp: 90 tablet, Rfl: 2 .  gabapentin (NEURONTIN) 300 MG capsule, Take 1-3 capsules (300-900 mg total) by mouth 4 (four) times daily., Disp: 360 capsule, Rfl: 2  EXAM:  VITALS per patient if applicable:  GENERAL: alert, oriented, appears  well and in no acute distress  HEENT: atraumatic, conjunttiva clear, no obvious abnormalities on inspection of external nose and ears  NECK: normal movements of the head and neck  LUNGS: on inspection no signs of respiratory distress, breathing rate appears normal, no obvious gross SOB, gasping or wheezing  CV: no obvious cyanosis  MS: moves all visible extremities without noticeable abnormality  PSYCH/NEURO: pleasant and cooperative, no obvious depression or anxiety, speech and thought processing grossly intact  ASSESSMENT AND PLAN:  Discussed the following assessment and plan:  Other fatigue - Plan: Amb Ref to Medical Weight Management  Anxiety and depression  Morbid obesity with BMI of 50.0-59.9, adult ([place]) - Plan: Amb Ref to Medical Weight Management  Vertigo  Essential hypertension  Edema, unspecified type     Problem List Items Addressed This Visit      Cardiovascular and Mediastinum   Essential hypertension    Well-controlled, continue regimen        Other   Morbid obesity with BMI of 50.0-59.9, adult ([place]) (Chronic)   Relevant Orders   Amb Ref to Medical Weight Management   Anxiety and depression      Office Visit from 11/14/2018 in [place]  PHQ-9 Total Score  13     Patient feels like this is stable despite PHQ 9 score.  At this time offered increasing Zoloft, she politely declines.  She would like to call Alene Mires on her own.  She  declines further changes at this time.  Will follow      Vertigo    Resolved at this time.  Declines further work-up.  Will follow      Other fatigue - Primary    Improved.  Suspect multifactorial including inadequate exercise, co morbities ( Afib, chronic pain), depression, obesity.  Advised to continue exercise program and to increase, gradually.  We also discussed consult with medical weight loss management, Redgie Grayer.  Patient will let me know how she is doing.  Close follow-up       Relevant Orders   Amb Ref to Medical Weight Management   Edema    Resolved today.  Advised patient to continue management as she is doing including elevation, Lasix.  She will certainly let me know if these measures fail to work.  Will follow         I discussed the assessment and treatment plan with the patient. The patient was provided an opportunity to ask questions and all were answered. The patient agreed with the plan and demonstrated an understanding of the instructions.   The patient was advised to call back or seek an in-person evaluation if the symptoms worsen or if the condition fails to improve as anticipated.   [name], FNP

## 2018-11-14 NOTE — Patient Instructions (Addendum)
Please call Alene Mires for follow up.   Labs on 12/05/18 at 10 am.   Today we discussed referrals, orders. Medical weight loss ( karen beasley)    I have placed these orders in the system for you.  Please be sure to give [place] a call if you have not heard from our office regarding this. We should hear from [place] within ONE week with information regarding your appointment. If not, please let me know immediately.   Let me know if swelling doesn't continue to respond to measures ( elevation, lasix) at home.   Let me know if you need anything at all.

## 2018-11-14 NOTE — Assessment & Plan Note (Signed)
Well controlled, continue regimen 

## 2018-11-14 NOTE — Assessment & Plan Note (Signed)
Resolved today.  Advised patient to continue management as she is doing including elevation, Lasix.  She will certainly let me know if these measures fail to work.  Will follow

## 2018-11-14 NOTE — Assessment & Plan Note (Addendum)
Improved.  Suspect multifactorial including inadequate exercise, co morbities ( Afib, chronic pain), depression, obesity.  Advised to continue exercise program and to increase, gradually.  We also discussed consult with medical weight loss management, Redgie Grayer.  Patient will let me know how she is doing.  Close follow-up

## 2018-11-14 NOTE — Assessment & Plan Note (Addendum)
Office Visit from 11/14/2018 in [place]  PHQ-9 Total Score  13     Patient feels like this is stable despite PHQ 9 score.  At this time offered increasing Zoloft, she politely declines.  She would like to call Alene Mires on her own.  She declines further changes at this time.  Will follow

## 2018-11-14 NOTE — Assessment & Plan Note (Signed)
Resolved at this time.  Declines further work-up.  Will follow

## 2018-12-04 ENCOUNTER — Other Ambulatory Visit: Payer: Self-pay | Admitting: Cardiovascular Disease

## 2018-12-04 NOTE — Telephone Encounter (Signed)
Please review for refill.  

## 2018-12-05 ENCOUNTER — Other Ambulatory Visit: Payer: Self-pay

## 2018-12-05 ENCOUNTER — Other Ambulatory Visit (INDEPENDENT_AMBULATORY_CARE_PROVIDER_SITE_OTHER): Payer: Medicare Other

## 2018-12-05 DIAGNOSIS — R899 Unspecified abnormal finding in specimens from other organs, systems and tissues: Secondary | ICD-10-CM

## 2018-12-05 LAB — TSH: TSH: 4.45 u[IU]/mL (ref 0.35–4.50)

## 2018-12-05 LAB — T3, FREE: T3, Free: 3.1 pg/mL (ref 2.3–4.2)

## 2018-12-05 LAB — T4, FREE: Free T4: 0.77 ng/dL (ref 0.60–1.60)

## 2018-12-07 ENCOUNTER — Encounter: Payer: Self-pay | Admitting: Family

## 2018-12-07 ENCOUNTER — Other Ambulatory Visit: Payer: Self-pay | Admitting: Family

## 2018-12-07 DIAGNOSIS — K760 Fatty (change of) liver, not elsewhere classified: Secondary | ICD-10-CM

## 2018-12-10 ENCOUNTER — Encounter: Payer: Self-pay | Admitting: Family

## 2018-12-10 ENCOUNTER — Other Ambulatory Visit: Payer: Self-pay | Admitting: Family

## 2018-12-10 DIAGNOSIS — J302 Other seasonal allergic rhinitis: Secondary | ICD-10-CM

## 2018-12-10 MED ORDER — ALBUTEROL SULFATE HFA 108 (90 BASE) MCG/ACT IN AERS: 2.0000 | INHALATION_SPRAY | Freq: Three times a day (TID) | RESPIRATORY_TRACT | 0 refills | Status: DC | PRN

## 2018-12-31 ENCOUNTER — Telehealth: Payer: Self-pay

## 2018-12-31 NOTE — Telephone Encounter (Signed)
Patient call transferred to me after patient had scheduled an appt with scheduling staff.  Patient said that she was out in the sun for about 2 hours 2 weeks ago and believed that she was sunburned.  Pt said that she has not been able to sleep for the last 4 or 5 days because she has a rash that reappears on her chest that is itchy and sometimes burns.  Pt said that she has taken Benadryl but it has not helped relieved the itching.  Patient denies having dizziness, no headaches, no pain, no blistering, no fever, no swelling.  Patient said rash is present only on chest and no other parts of body.  Pt said that skin peeled days ago.  Pt was scheduled with Dr. Aundra Dubin for tomorrow (01/01/19) at 10:30 am by scheduling staff prior call transfer.  Patient asked if I could recommend what she could take for relief until her appointment tomorrow. Informed patient that I could not recommend anything she would need to be evaluated by the physician but advised patient to contact her pharmacy for OTC for sunburn relief. Advised patient to go to Urgent Care if symptoms worsen or if symptoms become unbearable before scheduled appointment.

## 2019-01-01 ENCOUNTER — Other Ambulatory Visit: Payer: Self-pay

## 2019-01-01 ENCOUNTER — Ambulatory Visit (INDEPENDENT_AMBULATORY_CARE_PROVIDER_SITE_OTHER): Payer: Medicare Other | Admitting: Internal Medicine

## 2019-01-01 DIAGNOSIS — L559 Sunburn, unspecified: Secondary | ICD-10-CM | POA: Diagnosis not present

## 2019-01-01 MED ORDER — HYDROXYZINE HCL 25 MG PO TABS: 12.5000 mg | ORAL_TABLET | Freq: Three times a day (TID) | ORAL | 0 refills | Status: DC | PRN

## 2019-01-01 MED ORDER — MUPIROCIN 2 % EX OINT: 1.0000 "application " | TOPICAL_OINTMENT | Freq: Three times a day (TID) | CUTANEOUS | 1 refills | Status: DC

## 2019-01-01 NOTE — Progress Notes (Signed)
Virtual Visit via Video Note  I connected with Amber Barnes  on 01/01/19 at 10:30 AM EDT by a video enabled telemedicine application and verified that I am speaking with the correct person using two identifiers.  Location patient: home Persons participating in the virtual visit: patient, provider  I discussed the limitations of evaluation and management by telemedicine and the availability of in person appointments. The patient expressed understanding and agreed to proceed.   HPI:  2 weeks ago outside at home pool and forgot to reapply sunscreen and c/o sunburn to arms, chest. Arms are peeling but chest is itching and hard to sleep for the last 3-4 days she had to take a xanax to try to sleep and did not get to sleep until 4 am this morning. She has tried topical and oral benadryl, calamine. Aloe, cool compress which helps   ROS: See pertinent positives and negatives per HPI.  Past Medical History:  Diagnosis Date  . Achilles tendinitis   . Acute medial meniscal tear   . Allergy   . Anxiety   . Arthritis   . Atrial fibrillation ([place])    a. new onset 07/2016; b. CHADS2VASc --> 2 (HTN, female); c. eliquis  . CHF (congestive heart failure) ([place])   . Chronic lower back pain   . COPD (chronic obstructive pulmonary disease) ([place])   . Depression   . Fibrocystic breast disease   . GERD (gastroesophageal reflux disease)   . Hyperlipidemia   . Hypertension   . Insomnia   . Lipoma   . Migraine   . Osteoarthritis    left knee  . Osteopenia   . Sleep apnea    does not use her cpap  . Tendonitis   . Thyroid disease   . Vitamin D deficiency     Past Surgical History:  Procedure Laterality Date  . ABDOMINAL HYSTERECTOMY     ovaries left  . ACHILLES TENDON SURGERY     2012,01/2017  . BACK SURGERY    . CARDIOVERSION N/A 09/07/2016   Procedure: CARDIOVERSION;  Surgeon: Minna Merritts, MD;  Location: [facility];  Service: Cardiovascular;  Laterality: N/A;  . CARDIOVERSION N/A  10/19/2016   Procedure: CARDIOVERSION;  Surgeon: Minna Merritts, MD;  Location: [facility];  Service: Cardiovascular;  Laterality: N/A;  . CARDIOVERSION N/A 12/14/2016   Procedure: CARDIOVERSION;  Surgeon: Minna Merritts, MD;  Location: [facility];  Service: Cardiovascular;  Laterality: N/A;  . COLONOSCOPY    . ESOPHAGOGASTRODUODENOSCOPY    . THYROIDECTOMY     thyroid lobectomy secondary to a benign nodule  . TOTAL HIP ARTHROPLASTY Right 08/21/2018   Procedure: TOTAL HIP ARTHROPLASTY ANTERIOR APPROACH-RIGHT CELL SAVER REQUESTED;  Surgeon: [name], MD;  Location: [facility];  Service: Orthopedics;  Laterality: Right;    Family History  Problem Relation Age of Onset  . Cancer Sister 66       ovarian  . Depression Sister   . Cancer Father 51       colon  . Heart disease Father   . Heart attack Father 26       died from MI  . Hyperlipidemia Father   . Hypertension Father   . Cancer Mother        leukemia  . Breast cancer Paternal Aunt        65's  . Migraines Neg Hx     SOCIAL HX: lives with 67 y.o granddaughter and husband    Current Outpatient Medications:  .  albuterol (VENTOLIN HFA) 108 (90 Base) MCG/ACT inhaler, Inhale 2 puffs into the lungs every 8 (eight) hours as needed for wheezing or shortness of breath., Disp: 1 Inhaler, Rfl: 0 .  ALPRAZolam (XANAX) 0.25 MG tablet, Take 1 tablet (0.25 mg total) by mouth daily as needed for anxiety., Disp: 10 tablet, Rfl: 0 .  amiodarone (PACERONE) 200 MG tablet, Take 1 tablet (200 mg total) by mouth daily., Disp: 90 tablet, Rfl: 0 .  B Complex Vitamins (VITAMIN B COMPLEX PO), Take 1 tablet by mouth daily. , Disp: , Rfl:  .  bisoprolol (ZEBETA) 5 MG tablet, Take 0.5 tablets (2.5 mg total) by mouth daily., Disp: 90 tablet, Rfl: 1 .  calcium gluconate 500 MG tablet, Take 500 tablets by mouth daily., Disp: , Rfl:  .  Cholecalciferol (VITAMIN D3) 5000 units CAPS, Take 5,000 Units by mouth daily., Disp: , Rfl:  .  ELIQUIS 5 MG TABS tablet,  TAKE 1 TABLET BY MOUTH TWICE A DAY, Disp: 60 tablet, Rfl: 3 .  esomeprazole (NEXIUM) 20 MG capsule, Take 20 mg by mouth daily as needed (acid reflux)., Disp: , Rfl:  .  furosemide (LASIX) 20 MG tablet, TAKE 1 TABLET (20 MG TOTAL) BY MOUTH 2 (TWO) TIMES DAILY., Disp: 180 tablet, Rfl: 3 .  gabapentin (NEURONTIN) 300 MG capsule, Take 1-3 capsules (300-900 mg total) by mouth 4 (four) times daily., Disp: 360 capsule, Rfl: 2 .  hydrOXYzine (ATARAX/VISTARIL) 25 MG tablet, Take 0.5-1 tablets (12.5-25 mg total) by mouth 3 (three) times daily as needed., Disp: 90 tablet, Rfl: 0 .  KLOR-CON 10 10 MEQ tablet, TAKE 1 TABLET BY MOUTH TWICE A DAY (Patient taking differently: Take 10 mEq by mouth daily. ), Disp: 180 tablet, Rfl: 2 .  losartan (COZAAR) 100 MG tablet, Take 1 tablet (100 mg total) by mouth daily., Disp: 90 tablet, Rfl: 3 .  Multiple Vitamin (MULTIVITAMIN) tablet, Take 1 tablet by mouth daily., Disp: , Rfl:  .  mupirocin ointment (BACTROBAN) 2 %, Apply 1 application topically 3 (three) times daily., Disp: 30 g, Rfl: 1 .  oxymetazoline (AFRIN) 0.05 % nasal spray, Place 1 spray into both nostrils at bedtime as needed for congestion. , Disp: , Rfl:  .  ranolazine (RANEXA) 500 MG 12 hr tablet, TAKE 1 TABLET BY MOUTH TWICE A DAY, Disp: 60 tablet, Rfl: 5 .  rizatriptan (MAXALT) 10 MG tablet, TAKE 1 TABLET (10 MG TOTAL) BY MOUTH AS NEEDED FOR MIGRAINE. MAY REPEAT IN 2 HOURS IF NEEDED, Disp: 10 tablet, Rfl: 0 .  rosuvastatin (CRESTOR) 20 MG tablet, TAKE 1 TABLET BY MOUTH EVERY DAY, Disp: 90 tablet, Rfl: 0 .  sertraline (ZOLOFT) 100 MG tablet, TAKE 1 TABLET BY MOUTH EVERY DAY, Disp: 90 tablet, Rfl: 2  EXAM:  VITALS per patient if applicable:  GENERAL: alert, oriented, appears well and in no acute distress  HEENT: atraumatic, conjunttiva clear, no obvious abnormalities on inspection of external nose and ears  NECK: normal movements of the head and neck  LUNGS: on inspection no signs of respiratory  distress, breathing rate appears normal, no obvious gross SOB, gasping or wheezing  CV: no obvious cyanosis  MS: moves all visible extremities without noticeable abnormality  PSYCH/NEURO: pleasant and cooperative, no obvious depression or anxiety, speech and thought processing grossly intact  Skin: redness to chest =sunburn with some excoriations from scratching   ASSESSMENT AND PLAN:  Discussed the following assessment and plan:  Sunburn - Plan: hydrOXYzine (ATARAX/VISTARIL) 25 MG tablet 1/2 to  1 pill tid prn, mupirocin ointment (BACTROBAN) 2 % tid prn  Can continue cool compress, calamine, aloe      I discussed the assessment and treatment plan with the patient. The patient was provided an opportunity to ask questions and all were answered. The patient agreed with the plan and demonstrated an understanding of the instructions.   The patient was advised to call back or seek an in-person evaluation if the symptoms worsen or if the condition fails to improve as anticipated.  Time spent 15 minutes  Delorise Jackson, MD

## 2019-01-02 NOTE — Telephone Encounter (Signed)
Seen by Aundra Dubin

## 2019-01-09 ENCOUNTER — Other Ambulatory Visit: Payer: Self-pay | Admitting: Cardiovascular Disease

## 2019-01-10 ENCOUNTER — Other Ambulatory Visit: Payer: Self-pay | Admitting: Cardiovascular Disease

## 2019-01-14 ENCOUNTER — Encounter: Payer: Self-pay | Admitting: Family

## 2019-01-24 ENCOUNTER — Other Ambulatory Visit: Payer: Self-pay | Admitting: Internal Medicine

## 2019-01-24 DIAGNOSIS — L559 Sunburn, unspecified: Secondary | ICD-10-CM

## 2019-01-29 ENCOUNTER — Other Ambulatory Visit: Payer: Self-pay

## 2019-01-29 DIAGNOSIS — L559 Sunburn, unspecified: Secondary | ICD-10-CM

## 2019-01-29 MED ORDER — MUPIROCIN 2 % EX OINT: 1.0000 "application " | TOPICAL_OINTMENT | Freq: Three times a day (TID) | CUTANEOUS | 1 refills | Status: DC

## 2019-02-11 ENCOUNTER — Other Ambulatory Visit: Payer: Self-pay

## 2019-02-11 ENCOUNTER — Encounter: Payer: Self-pay | Admitting: Family

## 2019-02-11 ENCOUNTER — Ambulatory Visit (INDEPENDENT_AMBULATORY_CARE_PROVIDER_SITE_OTHER): Payer: Medicare Other | Admitting: Family

## 2019-02-11 DIAGNOSIS — F419 Anxiety disorder, unspecified: Secondary | ICD-10-CM | POA: Diagnosis not present

## 2019-02-11 DIAGNOSIS — Z122 Encounter for screening for malignant neoplasm of respiratory organs: Secondary | ICD-10-CM

## 2019-02-11 DIAGNOSIS — F329 Major depressive disorder, single episode, unspecified: Secondary | ICD-10-CM | POA: Diagnosis not present

## 2019-02-11 DIAGNOSIS — F32A Depression, unspecified: Secondary | ICD-10-CM

## 2019-02-11 MED ORDER — SERTRALINE HCL 100 MG PO TABS: 150.0000 mg | ORAL_TABLET | Freq: Every day | ORAL | 2 refills | Status: DC

## 2019-02-11 MED ORDER — ALPRAZOLAM 0.25 MG PO TABS: 0.2500 mg | ORAL_TABLET | Freq: Every day | ORAL | 0 refills | Status: DC | PRN

## 2019-02-11 NOTE — Patient Instructions (Addendum)
Increase zoloft to 150mg  and see if helps with sleep/anxiety.   Today we discussed referrals, orders. CT chest due to smoking history   I have placed these orders in the system for you.  Please be sure to give [place] a call if you have not heard from our office regarding this. We should hear from [place] within ONE week with information regarding your appointment. If not, please let me know immediately.    Stay safe!

## 2019-02-11 NOTE — Assessment & Plan Note (Addendum)
Overall doing well. Increase zoloft for breakthrough anxiety at bedtime. Reiterated using xanax sparingly and discussed risks of medication including addiction, overdose, cognitive decline. She verbalized understanding.   I looked up patient on [place] Controlled Substances Reporting System and saw no activity that raised concern of inappropriate use.

## 2019-02-11 NOTE — Progress Notes (Signed)
This visit type was conducted due to national recommendations for restrictions regarding the COVID-19 pandemic (e.g. social distancing).  This format is felt to be most appropriate for this patient at this time.  All issues noted in this document were discussed and addressed.  No physical exam was performed (except for noted visual exam findings with Video Visits). Virtual Visit via Video Note  I connected with@  on 02/11/19 at 10:30 AM EDT by a video enabled telemedicine application and verified that I am speaking with the correct person using two identifiers.  Location patient: home Location provider:work Persons participating in the virtual visit: patient, provider  I discussed the limitations of evaluation and management by telemedicine and the availability of in person appointments. The patient expressed understanding and agreed to proceed.   HPI:  Feels well. NO new complaints.  Has been walking at in the pool and feels like 'helping a lot . Energy has improved. Can now walk without cane in home.   Depression- on zoloft 100mg . Still has trouble staying asleep. Thinks 'mind doesn't shut down.' Takes xanax once per week , needs refill.   Chronic back pain- on 300mg  gabapentin qhs , not groggy.   HTN- compliant with medication. No cp, sob.   Afib- Compliant with medication. No palpitations.   Edema- Resolved. On lasix  Due colonoscopy; declines for now.    CT lung cancer screen; former smoker.    ROS: See pertinent positives and negatives per HPI.  Past Medical History:  Diagnosis Date  . Achilles tendinitis   . Acute medial meniscal tear   . Allergy   . Anxiety   . Arthritis   . Atrial fibrillation ([place])    a. new onset 07/2016; b. CHADS2VASc --> 2 (HTN, female); c. eliquis  . CHF (congestive heart failure) ([place])   . Chronic lower back pain   . COPD (chronic obstructive pulmonary disease) ([place])   . Depression   . Fibrocystic breast disease   . GERD (gastroesophageal  reflux disease)   . Hyperlipidemia   . Hypertension   . Insomnia   . Lipoma   . Migraine   . Osteoarthritis    left knee  . Osteopenia   . Sleep apnea    does not use her cpap  . Tendonitis   . Thyroid disease   . Vitamin D deficiency     Past Surgical History:  Procedure Laterality Date  . ABDOMINAL HYSTERECTOMY     ovaries left  . ACHILLES TENDON SURGERY     2012,01/2017  . BACK SURGERY    . CARDIOVERSION N/A 09/07/2016   Procedure: CARDIOVERSION;  Surgeon: Minna Merritts, MD;  Location: [facility];  Service: Cardiovascular;  Laterality: N/A;  . CARDIOVERSION N/A 10/19/2016   Procedure: CARDIOVERSION;  Surgeon: Minna Merritts, MD;  Location: [facility];  Service: Cardiovascular;  Laterality: N/A;  . CARDIOVERSION N/A 12/14/2016   Procedure: CARDIOVERSION;  Surgeon: Minna Merritts, MD;  Location: [facility];  Service: Cardiovascular;  Laterality: N/A;  . COLONOSCOPY    . ESOPHAGOGASTRODUODENOSCOPY    . THYROIDECTOMY     thyroid lobectomy secondary to a benign nodule  . TOTAL HIP ARTHROPLASTY Right 08/21/2018   Procedure: TOTAL HIP ARTHROPLASTY ANTERIOR APPROACH-RIGHT CELL SAVER REQUESTED;  Surgeon: [name], MD;  Location: [facility];  Service: Orthopedics;  Laterality: Right;    Family History  Problem Relation Age of Onset  . Cancer Sister 12       ovarian  . Depression Sister   .  Cancer Father 72       colon  . Heart disease Father   . Heart attack Father 42       died from MI  . Hyperlipidemia Father   . Hypertension Father   . Cancer Mother        leukemia  . Breast cancer Paternal Aunt        22's  . Migraines Neg Hx     SOCIAL HX: former smoker   Current Outpatient Medications:  .  albuterol (VENTOLIN HFA) 108 (90 Base) MCG/ACT inhaler, Inhale 2 puffs into the lungs every 8 (eight) hours as needed for wheezing or shortness of breath., Disp: 1 Inhaler, Rfl: 0 .  ALPRAZolam (XANAX) 0.25 MG tablet, Take 1 tablet (0.25 mg total) by mouth daily as needed  for anxiety or sleep., Disp: 30 tablet, Rfl: 0 .  amiodarone (PACERONE) 200 MG tablet, TAKE 1 TABLET BY MOUTH EVERY DAY, Disp: 90 tablet, Rfl: 3 .  B Complex Vitamins (VITAMIN B COMPLEX PO), Take 1 tablet by mouth daily. , Disp: , Rfl:  .  bisoprolol (ZEBETA) 5 MG tablet, Take 0.5 tablets (2.5 mg total) by mouth daily., Disp: 90 tablet, Rfl: 1 .  calcium gluconate 500 MG tablet, Take 500 tablets by mouth daily., Disp: , Rfl:  .  Cholecalciferol (VITAMIN D3) 5000 units CAPS, Take 5,000 Units by mouth daily., Disp: , Rfl:  .  ELIQUIS 5 MG TABS tablet, TAKE 1 TABLET BY MOUTH TWICE A DAY, Disp: 60 tablet, Rfl: 3 .  esomeprazole (NEXIUM) 20 MG capsule, Take 20 mg by mouth daily as needed (acid reflux)., Disp: , Rfl:  .  furosemide (LASIX) 20 MG tablet, TAKE 1 TABLET (20 MG TOTAL) BY MOUTH 2 (TWO) TIMES DAILY., Disp: 180 tablet, Rfl: 3 .  losartan (COZAAR) 100 MG tablet, Take 1 tablet (100 mg total) by mouth daily., Disp: 90 tablet, Rfl: 3 .  Multiple Vitamin (MULTIVITAMIN) tablet, Take 1 tablet by mouth daily., Disp: , Rfl:  .  mupirocin ointment (BACTROBAN) 2 %, Apply 1 application topically 3 (three) times daily., Disp: 30 g, Rfl: 1 .  oxymetazoline (AFRIN) 0.05 % nasal spray, Place 1 spray into both nostrils at bedtime as needed for congestion. , Disp: , Rfl:  .  potassium chloride (K-DUR) 10 MEQ tablet, TAKE 1 TABLET BY MOUTH TWICE A DAY, Disp: 180 tablet, Rfl: 1 .  ranolazine (RANEXA) 500 MG 12 hr tablet, TAKE 1 TABLET BY MOUTH TWICE A DAY, Disp: 60 tablet, Rfl: 5 .  rizatriptan (MAXALT) 10 MG tablet, TAKE 1 TABLET (10 MG TOTAL) BY MOUTH AS NEEDED FOR MIGRAINE. MAY REPEAT IN 2 HOURS IF NEEDED, Disp: 10 tablet, Rfl: 0 .  rosuvastatin (CRESTOR) 20 MG tablet, TAKE 1 TABLET BY MOUTH EVERY DAY, Disp: 90 tablet, Rfl: 0 .  sertraline (ZOLOFT) 100 MG tablet, Take 1.5 tablets (150 mg total) by mouth daily., Disp: 90 tablet, Rfl: 2 .  gabapentin (NEURONTIN) 300 MG capsule, Take 1-3 capsules (300-900 mg  total) by mouth 4 (four) times daily., Disp: 360 capsule, Rfl: 2  EXAM:  VITALS per patient if applicable: BP Readings from Last 3 Encounters:  09/18/18 (!) 142/88  09/10/18 102/64  08/25/18 (!) 157/64     GENERAL: alert, oriented, appears well and in no acute distress  HEENT: atraumatic, conjunttiva clear, no obvious abnormalities on inspection of external nose and ears  NECK: normal movements of the head and neck  LUNGS: on inspection no signs of respiratory distress,  breathing rate appears normal, no obvious gross SOB, gasping or wheezing  CV: no obvious cyanosis  MS: moves all visible extremities without noticeable abnormality  PSYCH/NEURO: pleasant and cooperative, no obvious depression or anxiety, speech and thought processing grossly intact  ASSESSMENT AND PLAN:  Discussed the following assessment and plan:  Problem List Items Addressed This Visit      Other   Anxiety and depression    Overall doing well. Increase zoloft for breakthrough anxiety at bedtime. Reiterated using xanax sparingly and discussed risks of medication including addiction, overdose, cognitive decline. She verbalized understanding.   I looked up patient on [place] Controlled Substances Reporting System and saw no activity that raised concern of inappropriate use.        Relevant Medications   ALPRAZolam (XANAX) 0.25 MG tablet   sertraline (ZOLOFT) 100 MG tablet    Other Visit Diagnoses    Encounter for screening for malignant neoplasm of respiratory organs       Relevant Orders   CT CHEST LUNG CANCER SCREENING LOW DOSE WO CONTRAST      I discussed the assessment and treatment plan with the patient. The patient was provided an opportunity to ask questions and all were answered. The patient agreed with the plan and demonstrated an understanding of the instructions.   The patient was advised to call back or seek an in-person evaluation if the symptoms worsen or if the condition fails to improve  as anticipated.   [name], FNP

## 2019-02-12 ENCOUNTER — Telehealth: Payer: Self-pay | Admitting: *Deleted

## 2019-02-12 NOTE — Telephone Encounter (Signed)
Received referral for lung screening, contacted patient and discussed smoking history which includes quit date > 15 years ago. Patient is made aware that she does not meet current lung screening criteria.

## 2019-03-02 ENCOUNTER — Other Ambulatory Visit: Payer: Self-pay | Admitting: Family

## 2019-03-02 DIAGNOSIS — K760 Fatty (change of) liver, not elsewhere classified: Secondary | ICD-10-CM

## 2019-03-04 ENCOUNTER — Encounter: Payer: Self-pay | Admitting: Family

## 2019-03-05 ENCOUNTER — Other Ambulatory Visit: Payer: Self-pay

## 2019-03-05 DIAGNOSIS — G2581 Restless legs syndrome: Secondary | ICD-10-CM

## 2019-03-05 DIAGNOSIS — M792 Neuralgia and neuritis, unspecified: Secondary | ICD-10-CM

## 2019-03-05 MED ORDER — GABAPENTIN 300 MG PO CAPS: 300.0000 mg | ORAL_CAPSULE | Freq: Four times a day (QID) | ORAL | 2 refills | Status: DC

## 2019-03-06 ENCOUNTER — Other Ambulatory Visit: Payer: Self-pay | Admitting: Cardiovascular Disease

## 2019-04-03 ENCOUNTER — Other Ambulatory Visit: Payer: Self-pay | Admitting: Family

## 2019-04-03 DIAGNOSIS — F329 Major depressive disorder, single episode, unspecified: Secondary | ICD-10-CM

## 2019-04-03 DIAGNOSIS — F32A Depression, unspecified: Secondary | ICD-10-CM

## 2019-04-03 DIAGNOSIS — J302 Other seasonal allergic rhinitis: Secondary | ICD-10-CM

## 2019-04-03 DIAGNOSIS — F419 Anxiety disorder, unspecified: Secondary | ICD-10-CM

## 2019-04-04 ENCOUNTER — Other Ambulatory Visit: Payer: Self-pay

## 2019-04-04 ENCOUNTER — Encounter: Payer: Self-pay | Admitting: Family

## 2019-04-04 DIAGNOSIS — G2581 Restless legs syndrome: Secondary | ICD-10-CM

## 2019-04-04 DIAGNOSIS — M792 Neuralgia and neuritis, unspecified: Secondary | ICD-10-CM

## 2019-04-04 MED ORDER — GABAPENTIN 300 MG PO CAPS: 300.0000 mg | ORAL_CAPSULE | Freq: Four times a day (QID) | ORAL | 2 refills | Status: DC

## 2019-04-05 NOTE — Telephone Encounter (Signed)
I have sent in rx for alprazolam 30 with no refills.  Please make sure pt has f/u appt scheduled.

## 2019-04-05 NOTE — Telephone Encounter (Signed)
Need to clarify with pt how often she is taking the medication.  Per Amber Barnes's last note, she only uses xanax one time per week.  Was just refilled on 02/11/19.  Is she out of medication?  Just need to clarify how taking prior to refill.

## 2019-04-08 ENCOUNTER — Telehealth: Payer: Self-pay

## 2019-04-08 ENCOUNTER — Ambulatory Visit (INDEPENDENT_AMBULATORY_CARE_PROVIDER_SITE_OTHER): Payer: Medicare Other

## 2019-04-08 ENCOUNTER — Other Ambulatory Visit: Payer: Self-pay

## 2019-04-08 DIAGNOSIS — Z23 Encounter for immunization: Secondary | ICD-10-CM

## 2019-04-08 NOTE — Telephone Encounter (Signed)
It is fine for her to have the pneumovax 23.

## 2019-04-08 NOTE — Telephone Encounter (Signed)
Patient said that [name], PCP had spoken with her at her last visit about getting the pneumovax 23 at her last visit.  I was unable to find recommendation in Margaret's notes.  Patient's chart shows that she had the prevnar 13 on 03/30/18 and a pneumococcal-unspecified on 03/23/12 with no details of the vaccine.  Please advise.

## 2019-04-09 ENCOUNTER — Ambulatory Visit: Payer: Medicare Other

## 2019-04-09 NOTE — Telephone Encounter (Signed)
Called and left detailed message on home number (ok per DPR) to inform pt its ok to get pneumonia vaccination and to call office to schedule appt.

## 2019-04-18 ENCOUNTER — Other Ambulatory Visit: Payer: Self-pay

## 2019-04-18 ENCOUNTER — Ambulatory Visit (INDEPENDENT_AMBULATORY_CARE_PROVIDER_SITE_OTHER): Payer: Medicare Other

## 2019-04-18 DIAGNOSIS — Z23 Encounter for immunization: Secondary | ICD-10-CM | POA: Diagnosis not present

## 2019-04-18 NOTE — Progress Notes (Addendum)
Patient came in today for pneomovax 23 per [facility] & Dr. Ellen Henri request. Vaccine was given in left deltoid and patient tolerated well.    Reviewed.  Dr Nicki Reaper

## 2019-05-05 ENCOUNTER — Other Ambulatory Visit: Payer: Self-pay | Admitting: Cardiovascular Disease

## 2019-05-06 NOTE — Telephone Encounter (Signed)
Refill Request.  

## 2019-05-06 NOTE — Telephone Encounter (Signed)
Pt's age 68, wt 146.3 kg, SCr 0.91, CrCl 140.45, last ov w/ Dr. [name] 10/23/18.

## 2019-05-10 ENCOUNTER — Other Ambulatory Visit: Payer: Self-pay | Admitting: Family

## 2019-05-10 DIAGNOSIS — F329 Major depressive disorder, single episode, unspecified: Secondary | ICD-10-CM

## 2019-05-10 DIAGNOSIS — F32A Depression, unspecified: Secondary | ICD-10-CM

## 2019-05-10 DIAGNOSIS — F419 Anxiety disorder, unspecified: Secondary | ICD-10-CM

## 2019-06-03 ENCOUNTER — Other Ambulatory Visit: Payer: Self-pay | Admitting: Cardiovascular Disease

## 2019-06-03 ENCOUNTER — Other Ambulatory Visit: Payer: Self-pay | Admitting: Family

## 2019-06-03 DIAGNOSIS — K760 Fatty (change of) liver, not elsewhere classified: Secondary | ICD-10-CM

## 2019-07-18 ENCOUNTER — Telehealth: Payer: Self-pay

## 2019-07-18 ENCOUNTER — Encounter: Payer: Self-pay | Admitting: Family

## 2019-07-18 NOTE — Telephone Encounter (Signed)
I called patient after seeing mychart message. Patient is scheduled on 1/15 @ 4p. She said that she had a knot pop up on her ankle. She said that it did not hurt, no redness or heat. She said she thought maybe it just had fluid in it. I told patient that if any of other symptoms occur to please call us or go to UC. I will keep her in mind if I see any cancellations.

## 2019-07-19 ENCOUNTER — Encounter: Payer: Self-pay | Admitting: Family

## 2019-07-19 ENCOUNTER — Ambulatory Visit (INDEPENDENT_AMBULATORY_CARE_PROVIDER_SITE_OTHER): Payer: Medicare Other | Admitting: Family

## 2019-07-19 ENCOUNTER — Other Ambulatory Visit: Payer: Self-pay

## 2019-07-19 DIAGNOSIS — M25471 Effusion, right ankle: Secondary | ICD-10-CM | POA: Insufficient documentation

## 2019-07-19 NOTE — Telephone Encounter (Signed)
Call Oakes, [place] Can he see patient next week? She has swelling on right lateral malleolus. No pain with walking nor redness, fever. I have advised rest, ice, compression, elevation until he can see her.

## 2019-07-19 NOTE — Assessment & Plan Note (Signed)
Certainly limited by failed video on exam.  By her description, it it is a discrete area of swelling or "puffy" on lateral malleolus.  It is only painful if she presses rather hard on it.  She does not have any gross symptoms of fever, erythema, severe pain to indicate bacterial infection or gout at this time.  We have made her appointment to see Dr. Frederico Hamman Copland for evaluation.  Over the next few days, advised patient rest, ice, elevation and compression.  She will use Tylenol as needed.  She understands to go to UC over the weekend if she experiences any fever, erythema, or worsening of pain

## 2019-07-19 NOTE — Progress Notes (Signed)
Virtual Visit via Video Note  I connected with@  on 07/19/19 at  9:00 AM EST by a video enabled telemedicine application and verified that I am speaking with the correct person using two identifiers.  Location patient: home Location provider:work Persons participating in the virtual visit: patient, provider  I discussed the limitations of evaluation and management by telemedicine and the availability of in person appointments. The patient expressed understanding and agreed to proceed.  Interactive audio and video telecommunications were attempted between this provider and patient, however failed, due to patient having technical difficulties or patient did not have access to video capability.  We continued and completed visit with audio only.    HPI: Acute visit Right ankle 'knot' , on the outside of ankle right over the phone. "puffy over the bone towards the heel.' Describes size of smaller than golf ball.  Painful if 'mashed on it. ' NO calf swelling, SOB, fever, erythema, laceration, bruise, numbness. No pain walking.  Not sure when  Happened; husband noticed it. Thinks may 'have knocked it as a good possibility.'  No h/o gout.  hasnt tried any medication.   ROS: See pertinent positives and negatives per HPI.  Past Medical History:  Diagnosis Date  . Achilles tendinitis   . Acute medial meniscal tear   . Allergy   . Anxiety   . Arthritis   . Atrial fibrillation ([place])    a. new onset 07/2016; b. CHADS2VASc --> 2 (HTN, female); c. eliquis  . CHF (congestive heart failure) ([place])   . Chronic lower back pain   . COPD (chronic obstructive pulmonary disease) ([place])   . Depression   . Fibrocystic breast disease   . GERD (gastroesophageal reflux disease)   . Hyperlipidemia   . Hypertension   . Insomnia   . Lipoma   . Migraine   . Osteoarthritis    left knee  . Osteopenia   . Sleep apnea    does not use her cpap  . Tendonitis   . Thyroid disease   . Vitamin D deficiency      Past Surgical History:  Procedure Laterality Date  . ABDOMINAL HYSTERECTOMY     ovaries left  . ACHILLES TENDON SURGERY     2012,01/2017  . BACK SURGERY    . CARDIOVERSION N/A 09/07/2016   Procedure: CARDIOVERSION;  Surgeon: Minna Merritts, MD;  Location: [facility];  Service: Cardiovascular;  Laterality: N/A;  . CARDIOVERSION N/A 10/19/2016   Procedure: CARDIOVERSION;  Surgeon: Minna Merritts, MD;  Location: [facility];  Service: Cardiovascular;  Laterality: N/A;  . CARDIOVERSION N/A 12/14/2016   Procedure: CARDIOVERSION;  Surgeon: Minna Merritts, MD;  Location: [facility];  Service: Cardiovascular;  Laterality: N/A;  . COLONOSCOPY    . ESOPHAGOGASTRODUODENOSCOPY    . THYROIDECTOMY     thyroid lobectomy secondary to a benign nodule  . TOTAL HIP ARTHROPLASTY Right 08/21/2018   Procedure: TOTAL HIP ARTHROPLASTY ANTERIOR APPROACH-RIGHT CELL SAVER REQUESTED;  Surgeon: [name], MD;  Location: [facility];  Service: Orthopedics;  Laterality: Right;    Family History  Problem Relation Age of Onset  . Cancer Sister 1       ovarian  . Depression Sister   . Cancer Father 74       colon  . Heart disease Father   . Heart attack Father 82       died from MI  . Hyperlipidemia Father   . Hypertension Father   . Cancer Mother  leukemia  . Breast cancer Paternal Aunt        52's  . Migraines Neg Hx     SOCIAL HX: former smoker   Current Outpatient Medications:  .  albuterol (VENTOLIN HFA) 108 (90 Base) MCG/ACT inhaler, INHALE 2 PUFFS INTO THE LUNGS EVERY 8 (EIGHT) HOURS AS NEEDED FOR WHEEZING OR SHORTNESS OF BREATH., Disp: 6.7 g, Rfl: 0 .  ALPRAZolam (XANAX) 0.25 MG tablet, TAKE 1 TABLET BY MOUTH DAILY AS NEEDED FOR ANXIETY OR SLEEP., Disp: 30 tablet, Rfl: 0 .  amiodarone (PACERONE) 200 MG tablet, TAKE 1 TABLET BY MOUTH EVERY DAY, Disp: 90 tablet, Rfl: 3 .  B Complex Vitamins (VITAMIN B COMPLEX PO), Take 1 tablet by mouth daily. , Disp: , Rfl:  .  bisoprolol (ZEBETA) 5 MG  tablet, Take 0.5 tablets (2.5 mg total) by mouth daily., Disp: 90 tablet, Rfl: 1 .  calcium gluconate 500 MG tablet, Take 500 tablets by mouth daily., Disp: , Rfl:  .  Cholecalciferol (VITAMIN D3) 5000 units CAPS, Take 5,000 Units by mouth daily., Disp: , Rfl:  .  ELIQUIS 5 MG TABS tablet, TAKE 1 TABLET BY MOUTH TWICE A DAY, Disp: 60 tablet, Rfl: 3 .  esomeprazole (NEXIUM) 20 MG capsule, Take 20 mg by mouth daily as needed (acid reflux)., Disp: , Rfl:  .  furosemide (LASIX) 20 MG tablet, TAKE 1 TABLET (20 MG TOTAL) BY MOUTH 2 (TWO) TIMES DAILY., Disp: 180 tablet, Rfl: 3 .  losartan (COZAAR) 100 MG tablet, Take 1 tablet (100 mg total) by mouth daily., Disp: 90 tablet, Rfl: 3 .  Multiple Vitamin (MULTIVITAMIN) tablet, Take 1 tablet by mouth daily., Disp: , Rfl:  .  oxymetazoline (AFRIN) 0.05 % nasal spray, Place 1 spray into both nostrils at bedtime as needed for congestion. , Disp: , Rfl:  .  potassium chloride (K-DUR) 10 MEQ tablet, TAKE 1 TABLET BY MOUTH TWICE A DAY, Disp: 180 tablet, Rfl: 1 .  ranolazine (RANEXA) 500 MG 12 hr tablet, TAKE 1 TABLET BY MOUTH TWICE A DAY, Disp: 180 tablet, Rfl: 0 .  rizatriptan (MAXALT) 10 MG tablet, TAKE 1 TABLET (10 MG TOTAL) BY MOUTH AS NEEDED FOR MIGRAINE. MAY REPEAT IN 2 HOURS IF NEEDED, Disp: 10 tablet, Rfl: 0 .  rosuvastatin (CRESTOR) 20 MG tablet, TAKE 1 TABLET BY MOUTH EVERY DAY, Disp: 90 tablet, Rfl: 0 .  sertraline (ZOLOFT) 100 MG tablet, TAKE 1.5 TABLETS BY MOUTH ONCE DAILY, Disp: 135 tablet, Rfl: 2 .  gabapentin (NEURONTIN) 300 MG capsule, Take 1-3 capsules (300-900 mg total) by mouth 4 (four) times daily., Disp: 360 capsule, Rfl: 2    ASSESSMENT AND PLAN:  Discussed the following assessment and plan:  Right ankle swelling Problem List Items Addressed This Visit      Other   Right ankle swelling    Certainly limited by failed video on exam.  By her description, it it is a discrete area of swelling or "puffy" on lateral malleolus.  It is only  painful if she presses rather hard on it.  She does not have any gross symptoms of fever, erythema, severe pain to indicate bacterial infection or gout at this time.  We have made her appointment to see Dr. Frederico Hamman Copland for evaluation.  Over the next few days, advised patient rest, ice, elevation and compression.  She will use Tylenol as needed.  She understands to go to UC over the weekend if she experiences any fever, erythema, or worsening of pain         -  we discussed possible serious and likely etiologies, options for evaluation and workup, limitations of telemedicine visit vs in person visit, treatment, treatment risks and precautions. Pt prefers to treat via telemedicine empirically rather then risking or undertaking an in person visit at this moment. Patient agrees to seek prompt in person care if worsening, new symptoms arise, or if is not improving with treatment.   I discussed the assessment and treatment plan with the patient. The patient was provided an opportunity to ask questions and all were answered. The patient agreed with the plan and demonstrated an understanding of the instructions.   The patient was advised to call back or seek an in-person evaluation if the symptoms worsen or if the condition fails to improve as anticipated.   [name], FNP

## 2019-07-24 ENCOUNTER — Telehealth: Payer: Self-pay

## 2019-07-24 ENCOUNTER — Telehealth: Payer: Self-pay | Admitting: Family

## 2019-07-24 ENCOUNTER — Other Ambulatory Visit: Payer: Self-pay

## 2019-07-24 NOTE — Telephone Encounter (Signed)
Called patient to verify if she was still taking hydroxyzine 0.5-1 mg tablets TID PRN. LMTCB.

## 2019-07-24 NOTE — Telephone Encounter (Signed)
Noted & will not refill.

## 2019-07-24 NOTE — Telephone Encounter (Signed)
Pt called to let you know that she is no longer taking hydroxyzine 0.5-1 mg tablets

## 2019-07-25 ENCOUNTER — Encounter: Payer: Self-pay | Admitting: Family Medicine

## 2019-07-25 ENCOUNTER — Other Ambulatory Visit: Payer: Self-pay

## 2019-07-25 ENCOUNTER — Ambulatory Visit (INDEPENDENT_AMBULATORY_CARE_PROVIDER_SITE_OTHER): Payer: Medicare Other | Admitting: Family Medicine

## 2019-07-25 VITALS — BP 140/80 | HR 60 | Temp 97.2°F | Ht 65.0 in

## 2019-07-25 DIAGNOSIS — M7671 Peroneal tendinitis, right leg: Secondary | ICD-10-CM | POA: Diagnosis not present

## 2019-07-25 DIAGNOSIS — M25571 Pain in right ankle and joints of right foot: Secondary | ICD-10-CM

## 2019-07-25 MED ORDER — PREDNISONE 20 MG PO TABS: 20.0000 mg | ORAL_TABLET | Freq: Every day | ORAL | 0 refills | Status: DC

## 2019-07-25 NOTE — Progress Notes (Signed)
Amber Barnes T. Henrick Mcgue, MD Primary Care and Sports Medicine [facility] at [facility] [place], [address] Phone: [phone]  FAX: [phone]  Amber Barnes - 69 y.o. female  MRN [id number]  Date of Birth: [birth year]  Visit Date: 07/25/2019  PCP: [name], FNP  Referred by: [name], FNP  Chief Complaint  Patient presents with  . Knot on Right Ankle    This visit occurred during the SARS-CoV-2 public health emergency.  Safety protocols were in place, including screening questions prior to the visit, additional usage of staff PPE, and extensive cleaning of exam room while observing appropriate contact time as indicated for disinfecting solutions.   Subjective:   Amber Barnes is a 69 y.o. very pleasant female patient with Body mass index is 58.24 kg/m. who presents with the following:  R ankle knot:  R lateral ankle.  She has some pain in a perception that her lateral ankle is more swollen compared to the left.  She is not had any specific injury, but she has increased her activity patterns recently and has been doing more walking.  Does have some modest tenderness to pushoff.  Is been no acute injury that she can recall at all.  She does use a cane at baseline but this has impaired her somewhat getting up and going up and down stairs and other things from a seated position.  Past Medical History, Surgical History, Social History, Family History, Problem List, Medications, and Allergies have been reviewed and updated if relevant.   GEN: No fevers, chills. Nontoxic. Primarily MSK c/o today. MSK: Detailed in the HPI GI: tolerating PO intake without difficulty Neuro: No numbness, parasthesias, or tingling associated. Otherwise the pertinent positives of the ROS are noted above.   Objective:   BP 140/80   Pulse 60   Temp (!) 97.2 F (36.2 C) (Temporal)   Ht 5\' 5"  (1.651 m)   SpO2 94%   BMI 58.24 kg/m     GEN: WDWN, NAD, Non-toxic, Alert & Oriented x 3 HEENT: Atraumatic, Normocephalic.  Ears and Nose: No external deformity. EXTR: No clubbing/cyanosis/ + tr b le edema NEURO: Normal gait, but slow and with some antalgia  PSYCH: Normally interactive. Conversant. Not depressed or anxious appearing.  Calm demeanor.    Entire bony anatomy of the forefoot, midfoot, hindfoot as well as the ankle is nontender bilaterally.  She does have some swelling in the right greater than left sinus Tarsi region.  And there is some modest peroneal tendon tenderness to palpation on the right.  Strength is 5/5 in all directions.  Neurovascularly intact.  Radiology: No results found.  Assessment and Plan:     ICD-10-CM   1. Sinus tarsi syndrome of right ankle  M25.571   2. Peroneal tendinitis of lower leg, right  M76.71    Swelling in the sinus Tarsi region with some overuse symptoms from her walking and some peroneal tendinopathy as well.  She is on anticoagulants as well.  I will give her some oral steroids to take, and I recommended that she get some compression socks to wear such as athletic compression socks over the next 10 to 14 days and this is most likely going to resolve on its own.  I reassured her that this is no long-term condition.  Follow-up: No follow-ups on file.  Meds ordered this encounter  Medications  . predniSONE (DELTASONE) 20 MG tablet    Sig: Take 1 tablet (  20 mg total) by mouth daily with breakfast.    Dispense:  10 tablet    Refill:  0   No orders of the defined types were placed in this encounter.   Signed,  Amber Deed. Chayce Robbins, MD   Outpatient Encounter Medications as of 07/25/2019  Medication Sig  . albuterol (VENTOLIN HFA) 108 (90 Base) MCG/ACT inhaler INHALE 2 PUFFS INTO THE LUNGS EVERY 8 (EIGHT) HOURS AS NEEDED FOR WHEEZING OR SHORTNESS OF BREATH.  Mar[place] Kitchen ALPRAZolam (XANAX) 0.25 MG tablet TAKE 1 TABLET BY MOUTH DAILY AS NEEDED FOR ANXIETY OR SLEEP.  Marland Kitchen amiodarone  (PACERONE) 200 MG tablet TAKE 1 TABLET BY MOUTH EVERY DAY  . B Complex Vitamins (VITAMIN B COMPLEX PO) Take 1 tablet by mouth daily.   . bisoprolol (ZEBETA) 5 MG tablet Take 0.5 tablets (2.5 mg total) by mouth daily.  . calcium gluconate 500 MG tablet Take 500 tablets by mouth daily.  . Cholecalciferol (VITAMIN D3) 5000 units CAPS Take 5,000 Units by mouth daily.  Marland Kitchen ELIQUIS 5 MG TABS tablet TAKE 1 TABLET BY MOUTH TWICE A DAY  . esomeprazole (NEXIUM) 20 MG capsule Take 20 mg by mouth daily as needed (acid reflux).  . furosemide (LASIX) 20 MG tablet TAKE 1 TABLET (20 MG TOTAL) BY MOUTH 2 (TWO) TIMES DAILY.  Marland Kitchen losartan (COZAAR) 100 MG tablet Take 1 tablet (100 mg total) by mouth daily.  . Multiple Vitamin (MULTIVITAMIN) tablet Take 1 tablet by mouth daily.  Marland Kitchen oxymetazoline (AFRIN) 0.05 % nasal spray Place 1 spray into both nostrils at bedtime as needed for congestion.   . potassium chloride (K-DUR) 10 MEQ tablet TAKE 1 TABLET BY MOUTH TWICE A DAY  . ranolazine (RANEXA) 500 MG 12 hr tablet TAKE 1 TABLET BY MOUTH TWICE A DAY  . rizatriptan (MAXALT) 10 MG tablet TAKE 1 TABLET (10 MG TOTAL) BY MOUTH AS NEEDED FOR MIGRAINE. MAY REPEAT IN 2 HOURS IF NEEDED  . rosuvastatin (CRESTOR) 20 MG tablet TAKE 1 TABLET BY MOUTH EVERY DAY  . sertraline (ZOLOFT) 100 MG tablet TAKE 1.5 TABLETS BY MOUTH ONCE DAILY  . gabapentin (NEURONTIN) 300 MG capsule Take 1-3 capsules (300-900 mg total) by mouth 4 (four) times daily.  . predniSONE (DELTASONE) 20 MG tablet Take 1 tablet (20 mg total) by mouth daily with breakfast.   No facility-administered encounter medications on file as of 07/25/2019.

## 2019-07-25 NOTE — Patient Instructions (Signed)
Athletic compression socks

## 2019-07-26 ENCOUNTER — Ambulatory Visit: Payer: Medicare Other | Admitting: Family

## 2019-07-27 ENCOUNTER — Other Ambulatory Visit: Payer: Self-pay | Admitting: Internal Medicine

## 2019-07-27 DIAGNOSIS — F419 Anxiety disorder, unspecified: Secondary | ICD-10-CM

## 2019-07-27 DIAGNOSIS — F32A Depression, unspecified: Secondary | ICD-10-CM

## 2019-07-27 DIAGNOSIS — F329 Major depressive disorder, single episode, unspecified: Secondary | ICD-10-CM

## 2019-07-31 NOTE — Telephone Encounter (Signed)
I looked up patient on [place] Controlled Substances Reporting System and saw no activity that raised concern of inappropriate use.   

## 2019-08-14 ENCOUNTER — Telehealth: Payer: Self-pay | Admitting: Family

## 2019-08-14 NOTE — Telephone Encounter (Signed)
Call pt Reviewing chart She is due for annual lung cancer CT screen Please advise her to call [phone] to schedule this

## 2019-08-15 NOTE — Progress Notes (Deleted)
Office Visit    Patient Name: Amber Barnes Date of Encounter: 08/15/2019  Primary Care Provider:  [name], FNP Primary Cardiologist:  Ida Rogue, MD Electrophysiologist:  None   Chief Complaint    Amber Barnes is a 69 y.o. female with a hx of PAF, obesity, arthritis, HLD, HTN. OSA on CPAP, migraines, anxiety presents today for follow up of PAF and HTN.   Past Medical History    Past Medical History:  Diagnosis Date  . Achilles tendinitis   . Acute medial meniscal tear   . Allergy   . Anxiety   . Arthritis   . Atrial fibrillation ([place])    a. new onset 07/2016; b. CHADS2VASc --> 2 (HTN, female); c. eliquis  . CHF (congestive heart failure) ([place])   . Chronic lower back pain   . COPD (chronic obstructive pulmonary disease) ([place])   . Depression   . Fibrocystic breast disease   . GERD (gastroesophageal reflux disease)   . Hyperlipidemia   . Hypertension   . Insomnia   . Lipoma   . Migraine   . Osteoarthritis    left knee  . Osteopenia   . Sleep apnea    does not use her cpap  . Tendonitis   . Thyroid disease   . Vitamin D deficiency    Past Surgical History:  Procedure Laterality Date  . ABDOMINAL HYSTERECTOMY     ovaries left  . ACHILLES TENDON SURGERY     2012,01/2017  . BACK SURGERY    . CARDIOVERSION N/A 09/07/2016   Procedure: CARDIOVERSION;  Surgeon: Minna Merritts, MD;  Location: [facility];  Service: Cardiovascular;  Laterality: N/A;  . CARDIOVERSION N/A 10/19/2016   Procedure: CARDIOVERSION;  Surgeon: Minna Merritts, MD;  Location: [facility];  Service: Cardiovascular;  Laterality: N/A;  . CARDIOVERSION N/A 12/14/2016   Procedure: CARDIOVERSION;  Surgeon: Minna Merritts, MD;  Location: [facility];  Service: Cardiovascular;  Laterality: N/A;  . COLONOSCOPY    . ESOPHAGOGASTRODUODENOSCOPY    . THYROIDECTOMY     thyroid lobectomy secondary to a benign nodule  . TOTAL HIP ARTHROPLASTY Right 08/21/2018   Procedure: TOTAL HIP  ARTHROPLASTY ANTERIOR APPROACH-RIGHT CELL SAVER REQUESTED;  Surgeon: [name], MD;  Location: [facility] OR[facility];  Service: Orthopedics;  Laterality: Right;    Allergies  Allergies  Allergen Reactions  . Prednisone Other (See Comments)    Agitation, hyperactivity, polyphagia  . Hydrocodone-Acetaminophen Other (See Comments)    Hallucinations and combative  . Oxycodone Itching    Can take it with benadryl  . Tramadol Itching    Can take it with benadryl  . Adhesive [Tape] Itching and Rash    Prefers paper tape  . Latex Itching and Rash    use paper tap    History of Present Illness    Amber Barnes is a 69 y.o. female with a hx of PAF, obesity, arthritis, HLD, HTN. OSA on CPAP, migraines, anxiety last seen via telemedicine by Dr. [name] 10/25/18.  She is s/p cardioversion 08/2016. She had stress test Jan 2018 with no ischemia, EF 55-65%.   She is due for her annual lung cancer screening and relayed information previously sent to her via [place].  EKGs/Labs/Other Studies Reviewed:   The following studies were reviewed today: ***  EKG:  EKG is ordered today.  The ekg ordered today demonstrates ***  Recent Labs: 11/12/2018: ALT 24; BUN 16; Creatinine, Ser 0.91; Hemoglobin 12.9; Platelets 186.0; Potassium 4.4; Sodium 140  12/05/2018: TSH 4.45  Recent Lipid Panel    Component Value Date/Time   CHOL 182 11/12/2018 0907   TRIG 111.0 11/12/2018 0907   HDL 70.80 11/12/2018 0907   CHOLHDL 3 11/12/2018 0907   VLDL 22.2 11/12/2018 0907   LDLCALC 89 11/12/2018 0907    Home Medications   No outpatient medications have been marked as taking for the 08/19/19 encounter (Appointment) with Loel Dubonnet, NP.      Review of Systems    ***   ROS All other systems reviewed and are otherwise negative except as noted above.  Physical Exam    VS:  There were no vitals taken for this visit. , BMI There is no height or weight on file to calculate BMI. GEN: Well nourished, well  developed, in no acute distress. HEENT: normal. Neck: Supple, no JVD, carotid bruits, or masses. Cardiac: ***RRR, no murmurs, rubs, or gallops. No clubbing, cyanosis, edema.  ***Radials/DP/PT 2+ and equal bilaterally.  Respiratory:  ***Respirations regular and unlabored, clear to auscultation bilaterally. GI: Soft, nontender, nondistended, BS + x 4. MS: No deformity or atrophy. Skin: Warm and dry, no rash. Neuro:  Strength and sensation are intact. Psych: Normal affect.  Accessory Clinical Findings    ECG personally reviewed by me today - *** - no acute changes.  Assessment & Plan    1. PAF -  2. HTN -  3. HLD -   Disposition: Follow up {follow up:15908} with ***   Loel Dubonnet, NP 08/15/2019, 11:52 AM

## 2019-08-15 NOTE — Telephone Encounter (Signed)
Mychart message sent to patient.

## 2019-08-19 ENCOUNTER — Ambulatory Visit: Payer: Medicare Other | Admitting: Family

## 2019-08-23 ENCOUNTER — Ambulatory Visit: Payer: Medicare Other | Attending: Internal Medicine

## 2019-08-23 ENCOUNTER — Other Ambulatory Visit: Payer: Self-pay

## 2019-08-23 DIAGNOSIS — Z23 Encounter for immunization: Secondary | ICD-10-CM

## 2019-08-23 NOTE — Progress Notes (Signed)
   Covid-19 Vaccination Clinic  Name:  Amber Barnes    MRN: [id number] DOB: 11-19-1950  08/23/2019  Ms. [name] was observed post Covid-19 immunization for 30 minutes based on pre-vaccination screening without incidence. She was provided with Vaccine Information Sheet and instruction to access the V-Safe system.   Ms. [name] was instructed to call 911 with any severe reactions post vaccine: Marland Kitchen Difficulty breathing  . Swelling of your face and throat  . A fast heartbeat  . A bad rash all over your body  . Dizziness and weakness    Immunizations Administered    Name Date Dose VIS Date Route   Pfizer COVID-19 Vaccine 08/23/2019 11:42 AM 0.3 mL 06/21/2019 Intramuscular   Manufacturer: [employer]   Lot: [id number]   [place]: [id number]

## 2019-08-25 ENCOUNTER — Other Ambulatory Visit: Payer: Self-pay | Admitting: Cardiovascular Disease

## 2019-08-25 ENCOUNTER — Other Ambulatory Visit: Payer: Self-pay | Admitting: Family

## 2019-08-25 DIAGNOSIS — K760 Fatty (change of) liver, not elsewhere classified: Secondary | ICD-10-CM

## 2019-08-26 ENCOUNTER — Ambulatory Visit: Payer: Medicare Other | Admitting: Family

## 2019-08-26 ENCOUNTER — Encounter: Payer: Self-pay | Admitting: Family

## 2019-08-26 ENCOUNTER — Other Ambulatory Visit: Payer: Self-pay

## 2019-08-26 VITALS — BP 142/76 | HR 59 | Ht 65.0 in | Wt 340.0 lb

## 2019-08-26 DIAGNOSIS — I10 Essential (primary) hypertension: Secondary | ICD-10-CM

## 2019-08-26 NOTE — Telephone Encounter (Signed)
Patient needs an appointment for further refills. If patient does not want to schedule an appointment please make them aware to contact PCP for refills. I have sent in enough medication until appointment can be made.   Thanks Ladies!

## 2019-08-26 NOTE — Patient Instructions (Addendum)
Medication Instructions:  No medication changes today. Continue your current medications.   *If you need a refill on your cardiac medications before your next appointment, please call your pharmacy*  Lab Work: No lab work today.   If you have labs (blood work) drawn today and your tests are completely normal, you will receive your results only by: Marland Kitchen MyChart Message (if you have MyChart) OR . A paper copy in the mail If you have any lab test that is abnormal or we need to change your treatment, we will call you to review the results.  Testing/Procedures: Your EKG showed sinus bradycardia.   Follow-Up: At [facility], you and your health needs are our priority.  As part of our continuing mission to provide you with exceptional heart care, we have created designated Provider Care Teams.  These Care Teams include your primary Cardiologist (physician) and Advanced Practice Providers (APPs -  Physician Assistants and Nurse Practitioners) who all work together to provide you with the care you need, when you need it.  Your next appointment:   6 month(s)  The format for your next appointment:   In Person  Provider:    You may see Ida Rogue, MD or one of the following Advanced Practice Providers on your designated Care Team:    Murray Hodgkins, NP  Christell Faith, PA-C  Marrianne Mood, PA-C  Other Instructions   Look up Youtube videos for Epley Maneuver   Recommend checking your blood pressure at least three times per week and keeping a log. If your blood pressure is consistently more than 130/80, please call our office.    Keep up the good work walking!  How to Perform the Epley Maneuver The Epley maneuver is an exercise that relieves symptoms of vertigo. Vertigo is the feeling that you or your surroundings are moving when they are not. When you feel vertigo, you may feel like the room is spinning and have trouble walking. Dizziness is a little different than vertigo.  When you are dizzy, you may feel unsteady or light-headed. You can do this maneuver at home whenever you have symptoms of vertigo. You can do it up to 3 times a day until your symptoms go away. Even though the Epley maneuver may relieve your vertigo for a few weeks, it is possible that your symptoms will return. This maneuver relieves vertigo, but it does not relieve dizziness. What are the risks? If it is done correctly, the Epley maneuver is considered safe. Sometimes it can lead to dizziness or nausea that goes away after a short time. If you develop other symptoms, such as changes in vision, weakness, or numbness, stop doing the maneuver and call your health care provider. How to perform the Epley maneuver 1. Sit on the edge of a bed or table with your back straight and your legs extended or hanging over the edge of the bed or table. 2. Turn your head halfway toward the affected ear or side. 3. Lie backward quickly with your head turned until you are lying flat on your back. You may want to position a pillow under your shoulders. 4. Hold this position for 30 seconds. You may experience an attack of vertigo. This is normal. 5. Turn your head to the opposite direction until your unaffected ear is facing the floor. 6. Hold this position for 30 seconds. You may experience an attack of vertigo. This is normal. Hold this position until the vertigo stops. 7. Turn your whole body to the  same side as your head. Hold for another 30 seconds. 8. Sit back up. You can repeat this exercise up to 3 times a day. Follow these instructions at home:  After doing the Epley maneuver, you can return to your normal activities.  Ask your health care provider if there is anything you should do at home to prevent vertigo. He or she may recommend that you: ? Keep your head raised (elevated) with two or more pillows while you sleep. ? Do not sleep on the side of your affected ear. ? Get up slowly from bed. ? Avoid  sudden movements during the day. ? Avoid extreme head movement, like looking up or bending over. Contact a health care provider if:  Your vertigo gets worse.  You have other symptoms, including: ? Nausea. ? Vomiting. ? Headache. Get help right away if:  You have vision changes.  You have a severe or worsening headache or neck pain.  You cannot stop vomiting.  You have new numbness or weakness in any part of your body. Summary  Vertigo is the feeling that you or your surroundings are moving when they are not.  The Epley maneuver is an exercise that relieves symptoms of vertigo.  If the Epley maneuver is done correctly, it is considered safe. You can do it up to 3 times a day. This information is not intended to replace advice given to you by your health care provider. Make sure you discuss any questions you have with your health care provider. Document Revised: 06/09/2017 Document Reviewed: 05/17/2016 Elsevier Patient Education  2020 [employer].

## 2019-08-26 NOTE — Progress Notes (Signed)
Office Visit    Patient Name: Amber Barnes Date of Encounter: 08/26/2019  Primary Care Provider:  [name], FNP Primary Cardiologist:  Ida Rogue, MD Electrophysiologist:  None   Chief Complaint    Amber Barnes is a 69 y.o. female with a hx of PAF, obesity, arthritis, HLD, HTN. OSA on CPAP, migraines, anxiety presents today for PAF, HTN   Past Medical History    Past Medical History:  Diagnosis Date  . Achilles tendinitis   . Acute medial meniscal tear   . Allergy   . Anxiety   . Arthritis   . Atrial fibrillation ([place])    a. new onset 07/2016; b. CHADS2VASc --> 2 (HTN, female); c. eliquis  . CHF (congestive heart failure) ([place])   . Chronic lower back pain   . COPD (chronic obstructive pulmonary disease) ([place])   . Depression   . Fibrocystic breast disease   . GERD (gastroesophageal reflux disease)   . Hyperlipidemia   . Hypertension   . Insomnia   . Lipoma   . Migraine   . Osteoarthritis    left knee  . Osteopenia   . Sleep apnea    does not use her cpap  . Tendonitis   . Thyroid disease   . Vitamin D deficiency    Past Surgical History:  Procedure Laterality Date  . ABDOMINAL HYSTERECTOMY     ovaries left  . ACHILLES TENDON SURGERY     2012,01/2017  . BACK SURGERY    . CARDIOVERSION N/A 09/07/2016   Procedure: CARDIOVERSION;  Surgeon: Minna Merritts, MD;  Location: [facility];  Service: Cardiovascular;  Laterality: N/A;  . CARDIOVERSION N/A 10/19/2016   Procedure: CARDIOVERSION;  Surgeon: Minna Merritts, MD;  Location: [facility];  Service: Cardiovascular;  Laterality: N/A;  . CARDIOVERSION N/A 12/14/2016   Procedure: CARDIOVERSION;  Surgeon: Minna Merritts, MD;  Location: [facility];  Service: Cardiovascular;  Laterality: N/A;  . COLONOSCOPY    . ESOPHAGOGASTRODUODENOSCOPY    . THYROIDECTOMY     thyroid lobectomy secondary to a benign nodule  . TOTAL HIP ARTHROPLASTY Right 08/21/2018   Procedure: TOTAL HIP ARTHROPLASTY ANTERIOR  APPROACH-RIGHT CELL SAVER REQUESTED;  Surgeon: [name], MD;  Location: [facility] OR[facility];  Service: Orthopedics;  Laterality: Right;   Allergies  Allergies  Allergen Reactions  . Prednisone Other (See Comments)    Agitation, hyperactivity, polyphagia  . Hydrocodone-Acetaminophen Other (See Comments)    Hallucinations and combative  . Oxycodone Itching    Can take it with benadryl  . Tramadol Itching    Can take it with benadryl  . Adhesive [Tape] Itching and Rash    Prefers paper tape  . Latex Itching and Rash    use paper tap    History of Present Illness    Amber Barnes is a 69 y.o. female with a hx of PAF, obesity, arthritis, HLD, HTN. OSA on CPAP, migraines, anxiety last seen Dr. [name] 10/25/18.  She is s/p cardioversion 08/2016. She had stress test Jan 2018 with no ischemia, EF 55-65%.    Very pleasant lady.  Tells me she has had a somewhat difficult year.  She lost her young grandson to SIDS.  She is presently helping care for her 67-year-old granddaughter and doing virtual schooling with her.  Her granddaughter lives with her during the week as her father works out of town.  Reports some increase in her anxiety.  Tells me she has been seeing a therapist and following  with her PCP.  Checks blood pressure at home and reports systolics are routinely 0000000.  She did have 1 episode last week of elevated blood pressure for which she took an extra anxiety pill with improvement.  She reports no recurrent episodes of atrial fibrillation.  Reports no chest pain, pressure, tightness.  Reports no shortness of breath or dyspnea on exertion.  Tells me she has been trying to make a effort to take better care of herself as she wants to be able to help care for her granddaughter and her husband who has MS.  She is down 10 pounds in 1 month and congratulated her.  EKGs/Labs/Other Studies Reviewed:   The following studies were reviewed today:  EKG:  EKG is ordered today.  The ekg ordered  today demonstrates SB 59 bpm with minimal voltage criteria for LVH (likely normal variant secondary to body habitus), no significant ST/T wave changes.  Recent Labs: 11/12/2018: ALT 24; BUN 16; Creatinine, Ser 0.91; Hemoglobin 12.9; Platelets 186.0; Potassium 4.4; Sodium 140 12/05/2018: TSH 4.45  Recent Lipid Panel    Component Value Date/Time   CHOL 182 11/12/2018 0907   TRIG 111.0 11/12/2018 0907   HDL 70.80 11/12/2018 0907   CHOLHDL 3 11/12/2018 0907   VLDL 22.2 11/12/2018 0907   LDLCALC 89 11/12/2018 0907    Home Medications   Current Meds  Medication Sig  . acetaminophen (TYLENOL) 500 MG tablet Take 500 mg by mouth every 6 (six) hours as needed. Taking as needed  . albuterol (VENTOLIN HFA) 108 (90 Base) MCG/ACT inhaler INHALE 2 PUFFS INTO THE LUNGS EVERY 8 (EIGHT) HOURS AS NEEDED FOR WHEEZING OR SHORTNESS OF BREATH.  Marland Kitchen ALPRAZolam (XANAX) 0.25 MG tablet TAKE 1 TABLET BY MOUTH DAILY AS NEEDED FOR ANXIETY OR SLEEP.  Marland Kitchen amiodarone (PACERONE) 200 MG tablet TAKE 1 TABLET BY MOUTH EVERY DAY  . B Complex Vitamins (VITAMIN B COMPLEX PO) Take 1 tablet by mouth daily.   . bisoprolol (ZEBETA) 5 MG tablet Take 0.5 tablets (2.5 mg total) by mouth daily.  . calcium gluconate 500 MG tablet Take 500 tablets by mouth daily.  . Cholecalciferol (VITAMIN D3) 5000 units CAPS Take 5,000 Units by mouth daily.  Marland Kitchen ELIQUIS 5 MG TABS tablet TAKE 1 TABLET BY MOUTH TWICE A DAY  . esomeprazole (NEXIUM) 20 MG capsule Take 20 mg by mouth daily as needed (acid reflux).  . furosemide (LASIX) 20 MG tablet TAKE 1 TABLET (20 MG TOTAL) BY MOUTH 2 (TWO) TIMES DAILY.  Marland Kitchen losartan (COZAAR) 100 MG tablet Take 1 tablet (100 mg total) by mouth daily.  . Multiple Vitamin (MULTIVITAMIN) tablet Take 1 tablet by mouth daily.  Marland Kitchen oxymetazoline (AFRIN) 0.05 % nasal spray Place 1 spray into both nostrils at bedtime as needed for congestion.   . potassium chloride (K-DUR) 10 MEQ tablet TAKE 1 TABLET BY MOUTH TWICE A DAY  . ranolazine  (RANEXA) 500 MG 12 hr tablet TAKE 1 TABLET BY MOUTH TWICE A DAY  . rizatriptan (MAXALT) 10 MG tablet TAKE 1 TABLET (10 MG TOTAL) BY MOUTH AS NEEDED FOR MIGRAINE. MAY REPEAT IN 2 HOURS IF NEEDED  . rosuvastatin (CRESTOR) 20 MG tablet TAKE 1 TABLET BY MOUTH EVERY DAY  . sertraline (ZOLOFT) 100 MG tablet TAKE 1.5 TABLETS BY MOUTH ONCE DAILY      Review of Systems       Review of Systems  Constitution: Negative for chills, fever and malaise/fatigue.  Cardiovascular: Negative for chest pain, dyspnea on exertion,  irregular heartbeat, leg swelling, near-syncope, orthopnea, palpitations and syncope.  Respiratory: Negative for cough, shortness of breath and wheezing.   Gastrointestinal: Negative for melena, nausea and vomiting.  Genitourinary: Negative for hematuria.  Neurological: Negative for dizziness, light-headedness and weakness.  Psychiatric/Behavioral: The patient is nervous/anxious.    All other systems reviewed and are otherwise negative except as noted above.  Physical Exam    VS:  BP (!) 142/76 (BP Location: Left Arm, Patient Position: Sitting, Cuff Size: Normal)   Pulse (!) 59   Ht 5\' 5"  (1.651 m)   Wt (!) 340 lb (154.2 kg)   SpO2 93%   BMI 56.58 kg/m  , BMI Body mass index is 56.58 kg/m. GEN: Well nourished, overweight, well developed, in no acute distress. HEENT: normal. Neck: Supple, no JVD, carotid bruits, or masses. Cardiac: Bradycardic, no murmurs, rubs, or gallops. No clubbing, cyanosis, edema.  Radials/DP/PT 2+ and equal bilaterally.  Respiratory:  Respirations regular and unlabored, clear to auscultation bilaterally. GI: Soft, nontender, nondistended, BS + x 4. MS: No deformity or atrophy. Skin: Warm and dry, no rash. Neuro:  Strength and sensation are intact. Psych: Normal affect.  Assessment & Plan    1. PAF - HR at home 50s to 60s.  Reports no recurrent episodes of PAF.  Continue amiodarone 200 mg daily, Ranexa extended release 500 mg daily, bisoprolol 2.5  mg daily.  2. Chronic anticoagulation -secondary to PAF and CHA2DS2-VASc of at least 3 (female, age, HTN).  Continue Eliquis 5 mg twice daily.  No bleeding complications.  3. HTN -BP 120-135/70-80 at home.  Blood pressure elevated in the office today and she was educated to report blood pressure consistently greater than 130/80 at home.  Continue bisoprolol 2 mg daily, losartan 100 mg daily, Lasix 20 mg daily.  If blood pressure persistently elevated consider transition to more potent ARB or addition of amlodipine.  4. HLD - Lipid panel 11/2018 with total cholesterol 182, HDL 70, LDL 89.  Continue Crestor.  5. On amiodarone therapy - Secondary to PAF.  No signs of toxicity.  11/2018 normal liver function and TSH 4.45.  Reports she just had her annual eye exam.  6. Vertigo -recent diagnosis.  We discussed the Epley maneuver.  Follows with her PCP.  7. Anxiety -follows with her PCP.  Encouraged her to continue with therapy.  Encouraged her to take her Zoloft as prescribed she endorses taking an extra dose every once in a while.  Disposition: Follow up in 6 month(s) with Dr. [name].   Loel Dubonnet, NP 08/26/2019, 3:36 PM

## 2019-08-26 NOTE — Telephone Encounter (Signed)
Pt is scheduled °

## 2019-08-29 ENCOUNTER — Other Ambulatory Visit: Payer: Self-pay | Admitting: Cardiovascular Disease

## 2019-09-08 ENCOUNTER — Other Ambulatory Visit: Payer: Self-pay | Admitting: Cardiovascular Disease

## 2019-09-09 NOTE — Telephone Encounter (Signed)
Pt's age 69, wt 154.2 kg, SCr 0.91, CrCl 148.03, last ov w/ CW 08/26/19.

## 2019-09-09 NOTE — Telephone Encounter (Signed)
Refill Request.  

## 2019-09-14 ENCOUNTER — Encounter: Payer: Self-pay | Admitting: Family

## 2019-09-14 DIAGNOSIS — I10 Essential (primary) hypertension: Secondary | ICD-10-CM

## 2019-09-16 MED ORDER — HYDROCHLOROTHIAZIDE 12.5 MG PO CAPS: 12.5000 mg | ORAL_CAPSULE | Freq: Every day | ORAL | 2 refills | Status: DC

## 2019-09-18 ENCOUNTER — Ambulatory Visit: Payer: Medicare Other | Attending: Internal Medicine

## 2019-09-18 DIAGNOSIS — Z23 Encounter for immunization: Secondary | ICD-10-CM | POA: Insufficient documentation

## 2019-09-18 NOTE — Progress Notes (Signed)
   Covid-19 Vaccination Clinic  Name:  Amber Barnes    MRN: [id number] DOB: [birth year]  09/18/2019  [name] was observed post Covid-19 immunization for 15 minutes without incident. She was provided with Vaccine Information Sheet and instruction to access the V-Safe system.   [name] was instructed to call 911 with any severe reactions post vaccine: Marland Kitchen Difficulty breathing  . Swelling of face and throat  . A fast heartbeat  . A bad rash all over body  . Dizziness and weakness   Immunizations Administered    Name Date Dose VIS Date Route   Pfizer COVID-19 Vaccine 09/18/2019 11:59 AM 0.3 mL 06/21/2019 Intramuscular   Manufacturer: [place]   Lot: [id number]   [place]: [id number]

## 2019-09-23 ENCOUNTER — Encounter: Payer: Self-pay | Admitting: Family

## 2019-10-18 ENCOUNTER — Telehealth (INDEPENDENT_AMBULATORY_CARE_PROVIDER_SITE_OTHER): Payer: Medicare Other | Admitting: Family

## 2019-10-18 ENCOUNTER — Encounter: Payer: Self-pay | Admitting: Family

## 2019-10-18 VITALS — Ht 65.0 in | Wt 325.0 lb

## 2019-10-18 DIAGNOSIS — I10 Essential (primary) hypertension: Secondary | ICD-10-CM | POA: Diagnosis not present

## 2019-10-18 DIAGNOSIS — G2581 Restless legs syndrome: Secondary | ICD-10-CM

## 2019-10-18 DIAGNOSIS — F419 Anxiety disorder, unspecified: Secondary | ICD-10-CM | POA: Diagnosis not present

## 2019-10-18 MED ORDER — ROPINIROLE HCL 0.25 MG PO TABS: 0.2500 mg | ORAL_TABLET | Freq: Every evening | ORAL | 1 refills | Status: DC

## 2019-10-18 NOTE — Progress Notes (Signed)
Verbal consent for services obtained from patient prior to services given to TELEPHONE visit:   Location of call:  provider at work patient at home  Names of all persons present for services: Amber Paris, NP and patient Chief complaint:  Worsening anxiety. Not even sure 'why gets worked up' If something doesn't 'go right' will get anxious. No depression.  At times has taken two xanax on some days. Taking zoloft 150 mg and doesn't feel like its working. Raising 67 year old granddaughter, working on custody Husband has MS.  Lost 15 lbs by listening to hunger and stopping eating before full. Cutting portions in half. Tried atarax, buspar, celexa.   HTN- 120/54 yesterday, more normally around 145/80, compliant with medication.   Walks every day and walking further every day 4-5 per day with dogs.  Denies exertional chest pain or pressure, numbness or tingling radiating to left arm or jaw, palpitations, dizziness, frequent headaches, changes in vision, or shortness of breath.   restless legs- improved taking 1200mg  BID. ( 2400 mg total /day). Feels sleepy on this medication No falls.      A/P/next steps: Problem List Items Addressed This Visit      Cardiovascular and Mediastinum   Essential hypertension    Concerned this is not well controlled.  Appears fluctuant.  Patient will monitor at home and come in 2 weeks for in person appointment.         Other   Anxiety    Worsening based on life circumstances and stressors.  Advised against any increase her Xanax.  Counseled patient on short acting medication with a high risk profile. I felt most comfortable if patient were to consult with psychiatry as she has tried several agents with myself, all of which were ineffective for her. At this time she declines psychiatry or counseling against my recommendation. Close follow up.       Restless leg - Primary    Concern with high-dose gabapentin and certainly with her being sedated.  We  will wean off this medication as discussed with patient today.  We will start Requip and titrate      Relevant Medications   rOPINIRole (REQUIP) 0.25 MG tablet   Other Relevant Orders   IBC + Ferritin   CBC with Differential/Platelet   Hemoglobin A1c   Lipid panel   VITAMIN D 25 Hydroxy (Vit-D Deficiency, Fractures)   TSH       I spent 25 min  discussing plan of care over the phone.

## 2019-10-18 NOTE — Patient Instructions (Addendum)
Gabapentin wean off   2 tablets in the  Morning and 2 tablets at night for 3-4 days 1 tablet in the morning and 1 tablet at night for 3 days Then 1 tablet every other day for 3 days, THEN STOP  After you are off the gabapentin, you may start the requip.  Monitor blood pressure,  Goal is less than 120/80, based on newest guidelines; if persistently higher, please make sooner follow up appointment so we can recheck you blood pressure and manage medications   We will see you soon for labs and visit

## 2019-10-18 NOTE — Assessment & Plan Note (Signed)
Concern with high-dose gabapentin and certainly with her being sedated.  We will wean off this medication as discussed with patient today.  We will start Requip and titrate

## 2019-10-18 NOTE — Assessment & Plan Note (Signed)
Worsening based on life circumstances and stressors.  Advised against any increase her Xanax.  Counseled patient on short acting medication with a high risk profile. I felt most comfortable if patient were to consult with psychiatry as she has tried several agents with myself, all of which were ineffective for her. At this time she declines psychiatry or counseling against my recommendation. Close follow up.

## 2019-10-18 NOTE — Assessment & Plan Note (Signed)
Concerned this is not well controlled.  Appears fluctuant.  Patient will monitor at home and come in 2 weeks for in person appointment.

## 2019-10-30 ENCOUNTER — Emergency Department
Admission: EM | Admit: 2019-10-30 | Discharge: 2019-10-30 | Disposition: A | Discharge status: Home / Self Care | Payer: Medicare Other | Attending: Emergency Medicine | Admitting: Emergency Medicine

## 2019-10-30 ENCOUNTER — Encounter: Payer: Self-pay | Admitting: Family

## 2019-10-30 ENCOUNTER — Encounter: Payer: Self-pay | Admitting: Emergency Medicine

## 2019-10-30 ENCOUNTER — Ambulatory Visit (INDEPENDENT_AMBULATORY_CARE_PROVIDER_SITE_OTHER): Payer: Medicare Other | Admitting: Family

## 2019-10-30 ENCOUNTER — Emergency Department: Payer: Medicare Other

## 2019-10-30 ENCOUNTER — Other Ambulatory Visit: Payer: Self-pay

## 2019-10-30 DIAGNOSIS — Z79899 Other long term (current) drug therapy: Secondary | ICD-10-CM | POA: Insufficient documentation

## 2019-10-30 DIAGNOSIS — J449 Chronic obstructive pulmonary disease, unspecified: Secondary | ICD-10-CM | POA: Diagnosis not present

## 2019-10-30 DIAGNOSIS — I4891 Unspecified atrial fibrillation: Secondary | ICD-10-CM | POA: Diagnosis not present

## 2019-10-30 DIAGNOSIS — R110 Nausea: Secondary | ICD-10-CM | POA: Diagnosis not present

## 2019-10-30 DIAGNOSIS — I10 Essential (primary) hypertension: Secondary | ICD-10-CM | POA: Diagnosis not present

## 2019-10-30 DIAGNOSIS — I509 Heart failure, unspecified: Secondary | ICD-10-CM | POA: Diagnosis not present

## 2019-10-30 DIAGNOSIS — I110 Hypertensive heart disease with heart failure: Secondary | ICD-10-CM | POA: Diagnosis not present

## 2019-10-30 DIAGNOSIS — Z7901 Long term (current) use of anticoagulants: Secondary | ICD-10-CM | POA: Diagnosis not present

## 2019-10-30 DIAGNOSIS — R531 Weakness: Secondary | ICD-10-CM | POA: Diagnosis not present

## 2019-10-30 DIAGNOSIS — R5381 Other malaise: Secondary | ICD-10-CM | POA: Diagnosis not present

## 2019-10-30 DIAGNOSIS — R42 Dizziness and giddiness: Secondary | ICD-10-CM | POA: Diagnosis not present

## 2019-10-30 DIAGNOSIS — Z743 Need for continuous supervision: Secondary | ICD-10-CM | POA: Diagnosis not present

## 2019-10-30 LAB — COMPREHENSIVE METABOLIC PANEL
ALT: 34 U/L (ref 0–44)
AST: 39 U/L (ref 15–41)
Albumin: 4.4 g/dL (ref 3.5–5.0)
Alkaline Phosphatase: 90 U/L (ref 38–126)
Anion gap: 10 (ref 5–15)
BUN: 14 mg/dL (ref 8–23)
CO2: 31 mmol/L (ref 22–32)
Calcium: 9.1 mg/dL (ref 8.9–10.3)
Chloride: 102 mmol/L (ref 98–111)
Creatinine, Ser: 0.95 mg/dL (ref 0.44–1.00)
GFR calc Af Amer: >60 mL/min (ref >60)
GFR calc non Af Amer: >60 mL/min (ref >60)
Glucose, Bld: 101 mg/dL — ABNORMAL HIGH (ref 70–99)
Potassium: 3.9 mmol/L (ref 3.5–5.1)
Sodium: 143 mmol/L (ref 135–145)
Total Bilirubin: 0.8 mg/dL (ref 0.3–1.2)
Total Protein: 7.9 g/dL (ref 6.5–8.1)

## 2019-10-30 LAB — CBC
HCT: 44.2 % (ref 36.0–46.0)
Hemoglobin: 14.5 g/dL (ref 12.0–15.0)
MCH: 28.9 pg (ref 26.0–34.0)
MCHC: 32.8 g/dL (ref 30.0–36.0)
MCV: 88.2 fL (ref 80.0–100.0)
Platelets: 195 10*3/uL (ref 150–400)
RBC: 5.01 MIL/uL (ref 3.87–5.11)
RDW: 14.8 % (ref 11.5–15.5)
WBC: 6.8 10*3/uL (ref 4.0–10.5)
nRBC: 0 % (ref 0.0–0.2)

## 2019-10-30 LAB — LIPASE, BLOOD: Lipase: 24 U/L (ref 11–51)

## 2019-10-30 MED ORDER — PROMETHAZINE HCL 25 MG/ML IJ SOLN
12.5000 mg | Freq: Once | INTRAMUSCULAR | Status: AC
  Administered 2019-10-30: 12.5 mg via INTRAVENOUS
  Filled 2019-10-30: qty 1

## 2019-10-30 MED ORDER — PROMETHAZINE HCL 12.5 MG PO TABS: 12.5000 mg | ORAL_TABLET | Freq: Four times a day (QID) | ORAL | 0 refills | Status: DC | PRN

## 2019-10-30 MED ORDER — SODIUM CHLORIDE 0.9 % IV SOLN
1000.0000 mL | Freq: Once | INTRAVENOUS | Status: AC
  Administered 2019-10-30: 1000 mL via INTRAVENOUS

## 2019-10-30 MED ORDER — SODIUM CHLORIDE 0.9% FLUSH: 3.0000 mL | Freq: Once | INTRAVENOUS | Status: DC

## 2019-10-30 NOTE — ED Provider Notes (Signed)
[facility] Emergency Department Provider Note   ____________________________________________    I have reviewed the triage vital signs and the nursing notes.   HISTORY  Chief Complaint Nausea and Dizziness     HPI Amber Barnes is a 69 y.o. female with significant past medical history as detailed below but includes atrial fibrillation, CHF, COPD on Eliquis presents with complaints of nausea, dizziness for over 3 weeks.  She denies chest pain or palpitations.  No abdominal pain.  She reports mild nausea which appears to be chronic, worsens with eating.  No diarrhea, no blood in her stool.  Has tried taking Zofran but she suffers headaches when she takes Zofran.  No fevers or chills.  No Covid symptoms.   Past Medical History:  Diagnosis Date  . Achilles tendinitis   . Acute medial meniscal tear   . Allergy   . Anxiety   . Arthritis   . Atrial fibrillation ([place])    a. new onset 07/2016; b. CHADS2VASc --> 2 (HTN, female); c. eliquis  . CHF (congestive heart failure) ([place])   . Chronic lower back pain   . COPD (chronic obstructive pulmonary disease) ([place])   . Depression   . Fibrocystic breast disease   . GERD (gastroesophageal reflux disease)   . Hyperlipidemia   . Hypertension   . Insomnia   . Lipoma   . Migraine   . Osteoarthritis    left knee  . Osteopenia   . Sleep apnea    does not use her cpap  . Tendonitis   . Thyroid disease   . Vitamin D deficiency     Patient Active Problem List   Diagnosis Date Noted  . Nausea 10/30/2019  . Major depression in remission ([place]) 08/27/2018  . Primary osteoarthritis involving multiple joints 08/27/2018  . A-fib ([place]) 08/27/2018  . COPD (chronic obstructive pulmonary disease) ([place]) 08/27/2018  . BMI 50.0-59.9, adult ([place]) 07/16/2018  . Morbid obesity with BMI of 50.0-59.9, adult ([place]) 05/31/2018  . Bradycardia 05/22/2017  . OSA (obstructive sleep apnea) 01/09/2017  . Hypertension 11/11/2014     Past Surgical History:  Procedure Laterality Date  . ABDOMINAL HYSTERECTOMY     ovaries left  . ACHILLES TENDON SURGERY     2012,01/2017  . BACK SURGERY    . CARDIOVERSION N/A 09/07/2016   Procedure: CARDIOVERSION;  Surgeon: Minna Merritts, MD;  Location: [facility] ORS;  Service: Cardiovascular;  Laterality: N/A;  . CARDIOVERSION N/A 10/19/2016   Procedure: CARDIOVERSION;  Surgeon: Minna Merritts, MD;  Location: [facility] ORS;  Service: Cardiovascular;  Laterality: N/A;  . CARDIOVERSION N/A 12/14/2016   Procedure: CARDIOVERSION;  Surgeon: Minna Merritts, MD;  Location: [facility] ORS;  Service: Cardiovascular;  Laterality: N/A;  . COLONOSCOPY    . ESOPHAGOGASTRODUODENOSCOPY    . THYROIDECTOMY     thyroid lobectomy secondary to a benign nodule  . TOTAL HIP ARTHROPLASTY Right 08/21/2018   Procedure: TOTAL HIP ARTHROPLASTY ANTERIOR APPROACH-RIGHT CELL SAVER REQUESTED;  Surgeon: [name], MD;  Location: [facility] ORS;  Service: Orthopedics;  Laterality: Right;    Prior to Admission medications   Medication Sig Start Date End Date Taking? Authorizing Provider  acetaminophen (TYLENOL) 500 MG tablet Take 500 mg by mouth every 6 (six) hours as needed. Taking as needed    [provider]  albuterol (VENTOLIN HFA) 108 (90 Base) MCG/ACT inhaler INHALE 2 PUFFS INTO THE LUNGS EVERY 8 (EIGHT) HOURS AS NEEDED FOR WHEEZING OR SHORTNESS OF BREATH. 04/04/19  [name], FNP  ALPRAZolam Duanne Moron) 0.25 MG tablet TAKE 1 TABLET BY MOUTH DAILY AS NEEDED FOR ANXIETY OR SLEEP. 07/31/19   [name], FNP  amiodarone (PACERONE) 200 MG tablet TAKE 1 TABLET BY MOUTH EVERY DAY 01/09/19   Minna Merritts, MD  B Complex Vitamins (VITAMIN B COMPLEX PO) Take 1 tablet by mouth daily.     [provider]  bisoprolol (ZEBETA) 5 MG tablet Take 0.5 tablets (2.5 mg total) by mouth daily. 07/25/18   [name], FNP  calcium gluconate 500 MG tablet Take 500 tablets by mouth daily.    [provider]  Cholecalciferol (VITAMIN D3) 5000 units CAPS Take 5,000 Units by mouth daily.    [provider]  ELIQUIS 5 MG TABS tablet TAKE 1 TABLET BY MOUTH TWICE A DAY 09/09/19   Minna Merritts, MD  esomeprazole (NEXIUM) 20 MG capsule Take 20 mg by mouth daily as needed (acid reflux).    [provider]  furosemide (LASIX) 20 MG tablet TAKE 1 TABLET (20 MG TOTAL) BY MOUTH 2 (TWO) TIMES DAILY. 10/04/18   Minna Merritts, MD  hydrochlorothiazide (MICROZIDE) 12.5 MG capsule Take 1 capsule (12.5 mg total) by mouth daily. 09/16/19 12/15/19  Loel Dubonnet, NP  losartan (COZAAR) 100 MG tablet TAKE 1 TABLET BY MOUTH EVERY DAY 08/29/19   Minna Merritts, MD  Multiple Vitamin (MULTIVITAMIN) tablet Take 1 tablet by mouth daily.    [provider]  oxymetazoline (AFRIN) 0.05 % nasal spray Place 1 spray into both nostrils at bedtime as needed for congestion.     [provider]  potassium chloride (K-DUR) 10 MEQ tablet TAKE 1 TABLET BY MOUTH TWICE A DAY 01/10/19   Minna Merritts, MD  promethazine (PHENERGAN) 12.5 MG tablet Take 1 tablet (12.5 mg total) by mouth every 6 (six) hours as needed for nausea or vomiting. 10/30/19   Lavonia Drafts, MD  ranolazine (RANEXA) 500 MG 12 hr tablet TAKE 1 TABLET BY MOUTH TWICE A DAY 08/26/19   Gollan, [name], MD  rizatriptan (MAXALT) 10 MG tablet TAKE 1 TABLET (10 MG TOTAL) BY MOUTH AS NEEDED FOR MIGRAINE. MAY REPEAT IN 2 HOURS IF NEEDED 03/13/18   Melvenia Beam, MD  rOPINIRole (REQUIP) 0.25 MG tablet Take 1 tablet (0.25 mg total) by mouth every evening. 10/18/19   [name], FNP  rosuvastatin (CRESTOR) 20 MG tablet TAKE 1 TABLET BY MOUTH EVERY DAY 08/26/19   [name], FNP  sertraline (ZOLOFT) 100 MG tablet TAKE 1.5 TABLETS BY MOUTH ONCE DAILY 05/10/19   [name], FNP     Allergies Prednisone, Hydrocodone-acetaminophen, Oxycodone, Tramadol, Adhesive [tape], and Latex  Family History  Problem  Relation Age of Onset  . Cancer Sister 29       ovarian  . Depression Sister   . Cancer Father 33       colon  . Heart disease Father   . Heart attack Father 37       died from MI  . Hyperlipidemia Father   . Hypertension Father   . Cancer Mother        leukemia  . Breast cancer Paternal Aunt        54's  . Migraines Neg Hx     Social History Social History   Tobacco Use  . Smoking status: Former Smoker    Packs/day: 0.25    Years: 10.00    Pack years:  2.50    Types: Cigarettes    Quit date: 2005    Years since quitting: 16.3  . Smokeless tobacco: Never Used  . Tobacco comment: smoked for 10 years, 1 pack every 2-3 days  Substance Use Topics  . Alcohol use: Yes    Comment: social, occasional  . Drug use: No    Review of Systems  Constitutional: No fever/chills Eyes: No visual changes.  ENT: No sore throat. Cardiovascular: Denies chest pain. Respiratory: Denies shortness of breath. Gastrointestinal: As above Genitourinary: Negative for dysuria. Musculoskeletal: Negative for back pain. Skin: Negative for rash. Neurological: Negative for headaches    ____________________________________________   PHYSICAL EXAM:  VITAL SIGNS: ED Triage Vitals  Enc Vitals Group     BP 10/30/19 1231 (!) 155/83     Pulse Rate 10/30/19 1231 64     Resp 10/30/19 1231 15     Temp 10/30/19 1231 98 F (36.7 C)     Temp Source 10/30/19 1231 Oral     SpO2 10/30/19 1231 96 %     Weight 10/30/19 1233 (!) 142.4 kg (314 lb)     Height 10/30/19 1233 1.651 m (5\' 5" )     Head Circumference --      Peak Flow --      Pain Score 10/30/19 1233 0     Pain Loc --      Pain Edu? --      Excl. in [place]? --     Constitutional: Alert and oriented.   Nose: No congestion/rhinnorhea. Mouth/Throat: Mucous membranes are moist.   Neck:  Painless ROM Cardiovascular: Normal rate, regular rhythm. Grossly normal heart sounds.  Good peripheral circulation. Respiratory: Normal respiratory effort.   No retractions. Lungs CTAB. Gastrointestinal: Soft and nontender. No distention.  No CVA tenderness.  Musculoskeletal: No lower extremity tenderness nor edema.  Warm and well perfused Neurologic:  Normal speech and language. No gross focal neurologic deficits are appreciated.  Skin:  Skin is warm, dry and intact. No rash noted. Psychiatric: Mood and affect are normal. Speech and behavior are normal.  ____________________________________________   LABS (all labs ordered are listed, but only abnormal results are displayed)  Labs Reviewed  COMPREHENSIVE METABOLIC PANEL - Abnormal; Notable for the following components:      Result Value   Glucose, Bld 101 (*)    All other components within normal limits  LIPASE, BLOOD  CBC  URINALYSIS, COMPLETE (UACMP) WITH MICROSCOPIC   ____________________________________________  EKG  None ____________________________________________  RADIOLOGY  Chest x-ray demonstrates cardiomegaly with mild central vascular congestion, no acute edema ____________________________________________   PROCEDURES  Procedure(s) performed: No  Procedures   Critical Care performed: No ____________________________________________   INITIAL IMPRESSION / ASSESSMENT AND PLAN / ED COURSE  Pertinent labs & imaging results that were available during my care of the patient were reviewed by me and considered in my medical decision making (see chart for details).  Patient presents with complaints of nausea, dizziness for about 3 weeks.  Also notes weight loss that has been significant approximately 40 pounds over the last 4-1/2 months.  She does report that she has adjusted her diet significantly.  Nausea is been occurring over the last 3 weeks.  Has no abdominal pain.  No fevers or chills.  No thyroid swelling.  Differential includes gastritis/PUD as it seems to get worse with eating.  We will give 500 cc of IV fluids here Phenergan for nausea for symptomatic relief  will likely need outpatient follow-up with  GI for further evaluation  Patient feeling much better after fluids and Phenergan, nausea is resolved.  Will discharge on p.o. nausea medications, outpatient follow-up with GI, return precautions discussed    ____________________________________________   FINAL CLINICAL IMPRESSION(S) / ED DIAGNOSES  Final diagnoses:  Dizziness  Nausea        Note:  This document was prepared using Dragon voice recognition software and may include unintentional dictation errors.   Lavonia Drafts, MD 10/30/19 [phone]

## 2019-10-30 NOTE — Patient Instructions (Signed)
Move losartan to the evening and see if this helps with vertigo and better blood pressure control in the morning.  We will follow you to the hospital.   Please stay safe.

## 2019-10-30 NOTE — Assessment & Plan Note (Signed)
Labile. Improved when used appropriately sized BP Cuff. Advised her to trial taking losartan at bedtime to see if this lowers blood pressure in the morning.  Patient will keep a log of blood pressures as well.  We certainly focused on more acute issues today including nausea for which patient is in the emergency room for.

## 2019-10-30 NOTE — Assessment & Plan Note (Signed)
Patient does not look well in exam room.  She has a constellation of complaints including nausea, dizziness, congestion.  Discussed her differentials including sinusitis which may have worsened vertigo, nausea.  Nausea perhaps related to vertigo, or abdominal pathology as she did have some tenderness on exam.  Overall, patient does not look well and my concern is this is not a simple vertigo, sinusitis presentation.  I advised her to go to a higher level of care where they can order imaging, labs stat and observe patient .  She is very agreeable to this and preferred to go by ambulance.  Patient is in ED now

## 2019-10-30 NOTE — ED Triage Notes (Signed)
Pt comes into the ED [facility], nausea for 3 weeks, decreased appetitive. Denies pain, 180/100.Marland Kitchen

## 2019-10-30 NOTE — Progress Notes (Signed)
Subjective:    Patient ID: Amber Barnes, female    DOB: 1950-10-21, 69 y.o.   MRN: [id number]  CC: Amber Barnes is a 69 y.o. female who presents today for an acute visit.    HPI: CC: nausea x 3 weeks, worsening. 'I just dont feel well.   'hasnt been able to eat.'  Everytime eats  Nausea is worsened. Endorses diaphoresis which comes and goes. Endorses vertigo 'room spinning' when bends over or roll over in bed. No vertigo right now however had earlier today when getting out of bed.  Endorses nasal congestion.   Unable to take zofran as had a HA on this medication which has happened in the past.  No tinnitus,HA, ear pressure, decreased hearing,  syncope,  epigastric burning, dysuria, urinary frequency, flank pain, CP, fever, vomiting, abdominal pain, constipated, diarrhea. BM today was brown and formed. No NSAIDs, alcohol use.  No associated exertional chest pain or pressure,frequent headaches, changes in vision, or shortness of breath.      Notes that onset around second covid vaccine.   Didn't mention this at prior visit as felt that it was related to congestion.   RLS- weaned off gabapentin; started requip yesterday.   HTN- takes pacerone, bisoprolol at night.  Takes hctz, losartan, lasix  in the morning. At home in the morning, BP is 'high'. 188/90 and then 2 hours later 100/60 as she has taken medication. Fluctuates.   H/o hysterectomy.   HISTORY:  Past Medical History:  Diagnosis Date  . Achilles tendinitis   . Acute medial meniscal tear   . Allergy   . Anxiety   . Arthritis   . Atrial fibrillation ([place])    a. new onset 07/2016; b. CHADS2VASc --> 2 (HTN, female); c. eliquis  . CHF (congestive heart failure) ([place])   . Chronic lower back pain   . COPD (chronic obstructive pulmonary disease) ([place])   . Depression   . Fibrocystic breast disease   . GERD (gastroesophageal reflux disease)   . Hyperlipidemia   . Hypertension   . Insomnia   . Lipoma   . Migraine   .  Osteoarthritis    left knee  . Osteopenia   . Sleep apnea    does not use her cpap  . Tendonitis   . Thyroid disease   . Vitamin D deficiency    Past Surgical History:  Procedure Laterality Date  . ABDOMINAL HYSTERECTOMY     ovaries left  . ACHILLES TENDON SURGERY     2012,01/2017  . BACK SURGERY    . CARDIOVERSION N/A 09/07/2016   Procedure: CARDIOVERSION;  Surgeon: Minna Merritts, MD;  Location: [facility] ORS;  Service: Cardiovascular;  Laterality: N/A;  . CARDIOVERSION N/A 10/19/2016   Procedure: CARDIOVERSION;  Surgeon: Minna Merritts, MD;  Location: [facility] ORS;  Service: Cardiovascular;  Laterality: N/A;  . CARDIOVERSION N/A 12/14/2016   Procedure: CARDIOVERSION;  Surgeon: Minna Merritts, MD;  Location: [facility] ORS;  Service: Cardiovascular;  Laterality: N/A;  . COLONOSCOPY    . ESOPHAGOGASTRODUODENOSCOPY    . THYROIDECTOMY     thyroid lobectomy secondary to a benign nodule  . TOTAL HIP ARTHROPLASTY Right 08/21/2018   Procedure: TOTAL HIP ARTHROPLASTY ANTERIOR APPROACH-RIGHT CELL SAVER REQUESTED;  Surgeon: [name], MD;  Location: [facility] ORS;  Service: Orthopedics;  Laterality: Right;   Family History  Problem Relation Age of Onset  . Cancer Sister 34       ovarian  . Depression Sister   .  Cancer Father 32       colon  . Heart disease Father   . Heart attack Father 36       died from MI  . Hyperlipidemia Father   . Hypertension Father   . Cancer Mother        leukemia  . Breast cancer Paternal Aunt        28's  . Migraines Neg Hx     Allergies: Prednisone, Hydrocodone-acetaminophen, Oxycodone, Tramadol, Adhesive [tape], and Latex No current facility-administered medications on file prior to visit.   Current Outpatient Medications on File Prior to Visit  Medication Sig Dispense Refill  . acetaminophen (TYLENOL) 500 MG tablet Take 500 mg by mouth every 6 (six) hours as needed. Taking as needed    . albuterol (VENTOLIN HFA) 108 (90 Base) MCG/ACT inhaler INHALE 2  PUFFS INTO THE LUNGS EVERY 8 (EIGHT) HOURS AS NEEDED FOR WHEEZING OR SHORTNESS OF BREATH. 6.7 g 0  . ALPRAZolam (XANAX) 0.25 MG tablet TAKE 1 TABLET BY MOUTH DAILY AS NEEDED FOR ANXIETY OR SLEEP. 30 tablet 1  . amiodarone (PACERONE) 200 MG tablet TAKE 1 TABLET BY MOUTH EVERY DAY 90 tablet 3  . B Complex Vitamins (VITAMIN B COMPLEX PO) Take 1 tablet by mouth daily.     . bisoprolol (ZEBETA) 5 MG tablet Take 0.5 tablets (2.5 mg total) by mouth daily. 90 tablet 1  . calcium gluconate 500 MG tablet Take 500 tablets by mouth daily.    . Cholecalciferol (VITAMIN D3) 5000 units CAPS Take 5,000 Units by mouth daily.    Marland Kitchen ELIQUIS 5 MG TABS tablet TAKE 1 TABLET BY MOUTH TWICE A DAY 60 tablet 3  . esomeprazole (NEXIUM) 20 MG capsule Take 20 mg by mouth daily as needed (acid reflux).    . furosemide (LASIX) 20 MG tablet TAKE 1 TABLET (20 MG TOTAL) BY MOUTH 2 (TWO) TIMES DAILY. 180 tablet 3  . hydrochlorothiazide (MICROZIDE) 12.5 MG capsule Take 1 capsule (12.5 mg total) by mouth daily. 30 capsule 2  . losartan (COZAAR) 100 MG tablet TAKE 1 TABLET BY MOUTH EVERY DAY 90 tablet 3  . Multiple Vitamin (MULTIVITAMIN) tablet Take 1 tablet by mouth daily.    Marland Kitchen oxymetazoline (AFRIN) 0.05 % nasal spray Place 1 spray into both nostrils at bedtime as needed for congestion.     . potassium chloride (K-DUR) 10 MEQ tablet TAKE 1 TABLET BY MOUTH TWICE A DAY 180 tablet 1  . ranolazine (RANEXA) 500 MG 12 hr tablet TAKE 1 TABLET BY MOUTH TWICE A DAY 180 tablet 0  . rizatriptan (MAXALT) 10 MG tablet TAKE 1 TABLET (10 MG TOTAL) BY MOUTH AS NEEDED FOR MIGRAINE. MAY REPEAT IN 2 HOURS IF NEEDED 10 tablet 0  . rOPINIRole (REQUIP) 0.25 MG tablet Take 1 tablet (0.25 mg total) by mouth every evening. 90 tablet 1  . rosuvastatin (CRESTOR) 20 MG tablet TAKE 1 TABLET BY MOUTH EVERY DAY 90 tablet 0  . sertraline (ZOLOFT) 100 MG tablet TAKE 1.5 TABLETS BY MOUTH ONCE DAILY 135 tablet 2    Social History   Tobacco Use  . Smoking status:  Former Smoker    Packs/day: 0.25    Years: 10.00    Pack years: 2.50    Types: Cigarettes    Quit date: 2005    Years since quitting: 16.3  . Smokeless tobacco: Never Used  . Tobacco comment: smoked for 10 years, 1 pack every 2-3 days  Substance Use Topics  .  Alcohol use: Yes    Comment: social, occasional  . Drug use: No    Review of Systems  Constitutional: Positive for appetite change and unexpected weight change. Negative for chills and fever.  Respiratory: Negative for cough.   Cardiovascular: Negative for chest pain and palpitations.  Gastrointestinal: Positive for nausea. Negative for abdominal distention, abdominal pain, constipation, diarrhea and vomiting.  Genitourinary: Negative for difficulty urinating and dysuria.  Neurological: Positive for dizziness. Negative for headaches.  Psychiatric/Behavioral: Negative for confusion.      Objective:    BP (!) 155/90 Comment: left arm with appropriately size BP cuff, thigh cuff  Pulse 61   Temp (!) 97.3 F (36.3 C) (Temporal)   Ht 5\' 5"  (1.651 m)   Wt (!) 314 lb (142.4 kg)   SpO2 95%   BMI 52.25 kg/m    Physical Exam Vitals reviewed.  Constitutional:      Appearance: Normal appearance. She is well-developed.  HENT:     Head: Normocephalic and atraumatic.     Right Ear: Hearing, tympanic membrane, ear canal and external ear normal. No decreased hearing noted. No drainage, swelling or tenderness. No middle ear effusion. No foreign body. Tympanic membrane is not erythematous or bulging.     Left Ear: Hearing, tympanic membrane, ear canal and external ear normal. No decreased hearing noted. No drainage, swelling or tenderness.  No middle ear effusion. No foreign body. Tympanic membrane is not erythematous or bulging.     Nose: Nose normal. No rhinorrhea.     Right Sinus: No maxillary sinus tenderness or frontal sinus tenderness.     Left Sinus: No maxillary sinus tenderness or frontal sinus tenderness.     Mouth/Throat:      Pharynx: Uvula midline. No oropharyngeal exudate or posterior oropharyngeal erythema.     Tonsils: No tonsillar abscesses.  Eyes:     Conjunctiva/sclera: Conjunctivae normal.     Pupils: Pupils are equal, round, and reactive to light.     Comments: Fundus normal bilaterally.   Cardiovascular:     Rate and Rhythm: Normal rate and regular rhythm.     Pulses: Normal pulses.     Heart sounds: Normal heart sounds.  Pulmonary:     Effort: Pulmonary effort is normal.     Breath sounds: Normal breath sounds. No wheezing, rhonchi or rales.  Abdominal:     General: Bowel sounds are normal. There is no distension.     Palpations: Abdomen is soft. Abdomen is not rigid. There is no fluid wave or mass.     Tenderness: There is abdominal tenderness in the right lower quadrant and left lower quadrant. There is no guarding or rebound.     Comments: Mild tenderness appreciated right lower left lower quadrant.  Lymphadenopathy:     Head:     Right side of head: No submental, submandibular, tonsillar, preauricular, posterior auricular or occipital adenopathy.     Left side of head: No submental, submandibular, tonsillar, preauricular, posterior auricular or occipital adenopathy.     Cervical: No cervical adenopathy.  Skin:    General: Skin is warm and dry.  Neurological:     Mental Status: She is alert.     Cranial Nerves: No cranial nerve deficit.     Sensory: No sensory deficit.     Deep Tendon Reflexes:     Reflex Scores:      Bicep reflexes are 2+ on the right side and 2+ on the left side.  Patellar reflexes are 2+ on the right side and 2+ on the left side.    Comments: Grip equal and strong bilateral upper extremities. Gait slight unsteady when ascending exam table. Able to perform rapid alternating movement without difficulty.   Psychiatric:        Speech: Speech normal.        Behavior: Behavior normal.        Thought Content: Thought content normal.        Assessment & Plan:    Problem List Items Addressed This Visit      Cardiovascular and Mediastinum   Hypertension    Labile. Improved when used appropriately sized BP Cuff. Advised her to trial taking losartan at bedtime to see if this lowers blood pressure in the morning.  Patient will keep a log of blood pressures as well.  We certainly focused on more acute issues today including nausea for which patient is in the emergency room for.        Other   Nausea    Patient does not look well in exam room.  She has a constellation of complaints including nausea, dizziness, congestion.  Discussed her differentials including sinusitis which may have worsened vertigo, nausea.  Nausea perhaps related to vertigo, or abdominal pathology as she did have some tenderness on exam.  Overall, patient does not look well and my concern is this is not a simple vertigo, sinusitis presentation.  I advised her to go to a higher level of care where they can order imaging, labs stat and observe patient .  She is very agreeable to this and preferred to go by ambulance.  Patient is in ED now           I am having Amber Barnes "Amber Barnes" maintain her multivitamin, B Complex Vitamins (VITAMIN B COMPLEX PO), oxymetazoline, Vitamin D3, esomeprazole, calcium gluconate, rizatriptan, bisoprolol, furosemide, amiodarone, potassium chloride, albuterol, sertraline, ALPRAZolam, rosuvastatin, ranolazine, acetaminophen, losartan, Eliquis, hydrochlorothiazide, and rOPINIRole.   No orders of the defined types were placed in this encounter.   Return precautions given.   Risks, benefits, and alternatives of the medications and treatment plan prescribed today were discussed, and patient expressed understanding.   Education regarding symptom management and diagnosis given to patient on AVS.  Continue to follow with [name], FNP for routine health maintenance.   Amber Barnes and I agreed with plan.   [name], FNP

## 2019-10-30 NOTE — ED Triage Notes (Signed)
Arrives from PCP for ED evaluation of 3 week history of nausea and dizziness.  Denies vomiting/ diarrhea.  Denies c/o pain.  AAOx3.  Skin warm and dry. NAD

## 2019-10-30 NOTE — ED Notes (Signed)
Pt up for discharge, requesting to leave before bolus is finished.

## 2019-10-31 ENCOUNTER — Encounter: Payer: Self-pay | Admitting: Family

## 2019-11-04 ENCOUNTER — Telehealth: Payer: Self-pay | Admitting: Cardiovascular Disease

## 2019-11-04 NOTE — Progress Notes (Deleted)
Virtual Visit via Video Note   This visit type was conducted due to national recommendations for restrictions regarding the COVID-19 Pandemic (e.g. social distancing) in an effort to limit this patient's exposure and mitigate transmission in our community.  Due to her co-morbid illnesses, this patient is at least at moderate risk for complications without adequate follow up.  This format is felt to be most appropriate for this patient at this time.  All issues noted in this document were discussed and addressed.  A limited physical exam was performed with this format.  Please refer to the patient's chart for her consent to telehealth for [facility].   Date:  11/04/2019   ID:  Amber Barnes, DOB [birth year], MRN [id number]  Patient Location:  [place] [id number]   Provider location:   [facility], [place] office  PCP:  [name], FNP  Cardiologist:  Arvid Right [facility]  Chief Complaint:  Leg weakness, recovering from hip surgery Chronic mild shortness of breath    History of Present Illness:    Amber Barnes is a 69 y.o. female who presents via audio/video conferencing for a telehealth visit today.   The patient does not symptoms concerning for COVID-19 infection (fever, chills, cough, or new SHORTNESS OF BREATH).   Patient has a past medical history of Paroxysmal atrial fibrillation Prior cardioversion obesity,  arthritis,  hyperlipidemia,  hypertension,  OSA on CPAP Migraines, anxiety Stress test January 2018 showing no ischemia, Ejection fraction 55-65% who presents for follow-up of her hyperlipidemia, hypertension, persistent atrial fibrillation  Holding NSR since mid 2018  Discussed recent events with her Feb 11,2020 Hip surgery "reaction anesthesia, drop in pressure" "Everything good now" Walks with walker  Last ekg 04/2018: EKG with NSR  Sleep ok Pressure left ear, into jaw Thought it was  vertigo, now thinks it is something else  taking amiodarone 200 daily No regular exercise program Walking for rehab for her hip Slow recovery  Cholesterol at goal, numbers reviewed with her in detail  Other past medical history reviewed cardioversion procedure 09/07/2016 Normal sinus rhythm restored at that time Back into atrial fibrillation on last hospital visit March 13  Flecainide held, on amiodarone Repeat cardioversion 10/19/2016 Normal sinus rhythm restored  Recurrent atrial fib Seen by Dr. [name], Started on ranexa and continued on amiodarone She converted to normal sinus rhythm on her own, did not need DCCV  In the hospital for foot surgery, this was delayed as she was found to be in atrial fibrillation Admitted to the hospital 07/27/2016 Started on diltiazem, beta blocker, anticoagulation  Back in hospsital 08/10/2016 Hospital records reviewed She wasvery anxious about palpitations Developed chest discomfort, tightness Medication doses were increased for better rate control Lasix in the emergency room for shortness of breath Also given DuoNeb's  She reports having a prior smoking history for 5 years, then smoked only small amounts    Prior CV studies:   The following studies were reviewed today:    Past Medical History:  Diagnosis Date  . Achilles tendinitis   . Acute medial meniscal tear   . Allergy   . Anxiety   . Arthritis   . Atrial fibrillation ([place])    a. new onset 07/2016; b. CHADS2VASc --> 2 (HTN, female); c. eliquis  . CHF (congestive heart failure) ([place])   . Chronic lower back pain   . COPD (chronic obstructive pulmonary disease) ([place])   . Depression   . Fibrocystic breast  disease   . GERD (gastroesophageal reflux disease)   . Hyperlipidemia   . Hypertension   . Insomnia   . Lipoma   . Migraine   . Osteoarthritis    left knee  . Osteopenia   . Sleep apnea    does not use her cpap  . Tendonitis   . Thyroid disease   . Vitamin  D deficiency    Past Surgical History:  Procedure Laterality Date  . ABDOMINAL HYSTERECTOMY     ovaries left  . ACHILLES TENDON SURGERY     2012,01/2017  . BACK SURGERY    . CARDIOVERSION N/A 09/07/2016   Procedure: CARDIOVERSION;  Surgeon: Minna Merritts, MD;  Location: [facility] ORS;  Service: Cardiovascular;  Laterality: N/A;  . CARDIOVERSION N/A 10/19/2016   Procedure: CARDIOVERSION;  Surgeon: Minna Merritts, MD;  Location: [facility] ORS;  Service: Cardiovascular;  Laterality: N/A;  . CARDIOVERSION N/A 12/14/2016   Procedure: CARDIOVERSION;  Surgeon: Minna Merritts, MD;  Location: [facility] ORS;  Service: Cardiovascular;  Laterality: N/A;  . COLONOSCOPY    . ESOPHAGOGASTRODUODENOSCOPY    . THYROIDECTOMY     thyroid lobectomy secondary to a benign nodule  . TOTAL HIP ARTHROPLASTY Right 08/21/2018   Procedure: TOTAL HIP ARTHROPLASTY ANTERIOR APPROACH-RIGHT CELL SAVER REQUESTED;  Surgeon: [name], MD;  Location: [facility] ORS;  Service: Orthopedics;  Laterality: Right;     No outpatient medications have been marked as taking for the 11/05/19 encounter (Appointment) with Minna Merritts, MD.     Allergies:   Prednisone, Hydrocodone-acetaminophen, Oxycodone, Tramadol, Adhesive [tape], and Latex   Social History   Tobacco Use  . Smoking status: Former Smoker    Packs/day: 0.25    Years: 10.00    Pack years: 2.50    Types: Cigarettes    Quit date: 2005    Years since quitting: 16.3  . Smokeless tobacco: Never Used  . Tobacco comment: smoked for 10 years, 1 pack every 2-3 days  Substance Use Topics  . Alcohol use: Yes    Comment: social, occasional  . Drug use: No     Current Outpatient Medications on File Prior to Visit  Medication Sig Dispense Refill  . acetaminophen (TYLENOL) 500 MG tablet Take 500 mg by mouth every 6 (six) hours as needed. Taking as needed    . albuterol (VENTOLIN HFA) 108 (90 Base) MCG/ACT inhaler INHALE 2 PUFFS INTO THE LUNGS EVERY 8 (EIGHT) HOURS AS NEEDED FOR  WHEEZING OR SHORTNESS OF BREATH. 6.7 g 0  . ALPRAZolam (XANAX) 0.25 MG tablet TAKE 1 TABLET BY MOUTH DAILY AS NEEDED FOR ANXIETY OR SLEEP. 30 tablet 1  . amiodarone (PACERONE) 200 MG tablet TAKE 1 TABLET BY MOUTH EVERY DAY 90 tablet 3  . B Complex Vitamins (VITAMIN B COMPLEX PO) Take 1 tablet by mouth daily.     . bisoprolol (ZEBETA) 5 MG tablet Take 0.5 tablets (2.5 mg total) by mouth daily. 90 tablet 1  . calcium gluconate 500 MG tablet Take 500 tablets by mouth daily.    . Cholecalciferol (VITAMIN D3) 5000 units CAPS Take 5,000 Units by mouth daily.    Marland Kitchen ELIQUIS 5 MG TABS tablet TAKE 1 TABLET BY MOUTH TWICE A DAY 60 tablet 3  . esomeprazole (NEXIUM) 20 MG capsule Take 20 mg by mouth daily as needed (acid reflux).    . furosemide (LASIX) 20 MG tablet TAKE 1 TABLET (20 MG TOTAL) BY MOUTH 2 (TWO) TIMES DAILY. 180 tablet 3  .  hydrochlorothiazide (MICROZIDE) 12.5 MG capsule Take 1 capsule (12.5 mg total) by mouth daily. 30 capsule 2  . losartan (COZAAR) 100 MG tablet TAKE 1 TABLET BY MOUTH EVERY DAY 90 tablet 3  . Multiple Vitamin (MULTIVITAMIN) tablet Take 1 tablet by mouth daily.    Marland Kitchen oxymetazoline (AFRIN) 0.05 % nasal spray Place 1 spray into both nostrils at bedtime as needed for congestion.     . potassium chloride (K-DUR) 10 MEQ tablet TAKE 1 TABLET BY MOUTH TWICE A DAY 180 tablet 1  . promethazine (PHENERGAN) 12.5 MG tablet Take 1 tablet (12.5 mg total) by mouth every 6 (six) hours as needed for nausea or vomiting. 30 tablet 0  . ranolazine (RANEXA) 500 MG 12 hr tablet TAKE 1 TABLET BY MOUTH TWICE A DAY 180 tablet 0  . rizatriptan (MAXALT) 10 MG tablet TAKE 1 TABLET (10 MG TOTAL) BY MOUTH AS NEEDED FOR MIGRAINE. MAY REPEAT IN 2 HOURS IF NEEDED 10 tablet 0  . rOPINIRole (REQUIP) 0.25 MG tablet Take 1 tablet (0.25 mg total) by mouth every evening. 90 tablet 1  . rosuvastatin (CRESTOR) 20 MG tablet TAKE 1 TABLET BY MOUTH EVERY DAY 90 tablet 0  . sertraline (ZOLOFT) 100 MG tablet TAKE 1.5  TABLETS BY MOUTH ONCE DAILY 135 tablet 2   No current facility-administered medications on file prior to visit.     Family Hx: The patient's family history includes Breast cancer in her paternal aunt; Cancer in her mother; Cancer (age of onset: 34) in her sister; Cancer (age of onset: 22) in her father; Depression in her sister; Heart attack (age of onset: 51) in her father; Heart disease in her father; Hyperlipidemia in her father; Hypertension in her father. There is no history of Migraines.  ROS:   Please see the history of present illness.    Review of Systems  Constitutional: Negative.   Respiratory: Positive for shortness of breath.   Cardiovascular: Negative.   Gastrointestinal: Negative.   Musculoskeletal: Positive for joint pain.       Gait instability, walking with a walker  Neurological: Negative.   Psychiatric/Behavioral: Negative.   All other systems reviewed and are negative.    Labs/Other Tests and Data Reviewed:    Recent Labs: 12/05/2018: TSH 4.45 10/30/2019: ALT 34; BUN 14; Creatinine, Ser 0.95; Hemoglobin 14.5; Platelets 195; Potassium 3.9; Sodium 143   Recent Lipid Panel Lab Results  Component Value Date/Time   CHOL 182 11/12/2018 09:07 AM   TRIG 111.0 11/12/2018 09:07 AM   HDL 70.80 11/12/2018 09:07 AM   CHOLHDL 3 11/12/2018 09:07 AM   LDLCALC 89 11/12/2018 09:07 AM    Wt Readings from Last 3 Encounters:  10/30/19 (!) 314 lb (142.4 kg)  10/30/19 (!) 314 lb (142.4 kg)  10/18/19 (!) 325 lb (147.4 kg)     Exam:    Vital Signs: Vital signs may also be detailed in the HPI There were no vitals taken for this visit.  Wt Readings from Last 3 Encounters:  10/30/19 (!) 314 lb (142.4 kg)  10/30/19 (!) 314 lb (142.4 kg)  10/18/19 (!) 325 lb (147.4 kg)   Temp Readings from Last 3 Encounters:  10/30/19 97.6 F (36.4 C) (Oral)  10/30/19 (!) 97.3 F (36.3 C) (Temporal)  07/25/19 (!) 97.2 F (36.2 C) (Temporal)   BP Readings from Last 3 Encounters:    10/30/19 (!) 175/76  10/30/19 (!) 155/90  08/26/19 (!) 142/76   Pulse Readings from Last 3 Encounters:  10/30/19 62  10/30/19 61  08/26/19 (!) 59     Blood pressure good: 116/60 - 130/70, Pulse 50 to 60, regular resp 16  Well nourished, well developed female in no acute distress. Constitutional:  oriented to person, place, and time. No distress.  Head: Normocephalic and atraumatic.  Eyes:  no discharge. No scleral icterus.  Neck: Normal range of motion. Neck supple.  Pulmonary/Chest: No audible wheezing, no distress, appears comfortable Musculoskeletal: Normal range of motion.  no  tenderness or deformity.  Neurological:   Coordination normal. Full exam not performed Skin:  No rash Psychiatric:  normal mood and affect. behavior is normal. Thought content normal.    ASSESSMENT & PLAN:    Syncope No further spells No further work-up at this time  Anxiety Better Doing well, thinks everything is stable  Mixed hyperlipidemia Cholesterol is at goal on the current lipid regimen. No changes to the medications were made. Stable  Essential hypertension Recommend she stop hydralazine 4 times a day Increase losartan up to 100 mg daily Monitor blood pressure and call us if numbers run low  Atrial fibrillation with RVR (HCC) Suspect maintaining normal sinus rhythm No changes made to her medications  SOB (shortness of breath) Chronic issue likely secondary to obesity, deconditioning Recommended weight loss  Morbid obesity (HCC) We have encouraged continued exercise, careful diet management in an effort to lose weight.  Acute pulmonary edema (HCC) Recommend she continue on her Lasix, modify her salt and fluid intake   COVID-19 Education: The signs and symptoms of COVID-19 were discussed with the patient and how to seek care for testing (follow up with PCP or arrange E-visit).  The importance of social distancing was discussed today.  Patient Risk:   After full  review of this patients clinical status, I feel that they are at least moderate risk at this time.  Time:   Today, I have spent 25 minutes with the patient with telehealth technology discussing the cardiac and medical problems/diagnoses detailed above     Medication Adjustments/Labs and Tests Ordered: Current medicines are reviewed at length with the patient today.  Concerns regarding medicines are outlined above.   Tests Ordered: No tests ordered   Medication Changes: Changes as above Will come off the hydralazine increase losartan   Disposition: Follow-up in 6 months   Signed, Ida Rogue, MD  11/04/2019 6:08 PM    [place] [facility] [address] [place] [address], [place], [place] [address]

## 2019-11-04 NOTE — Telephone Encounter (Signed)
Pt reports elevation in HR over the past 2 days. Recently went to ED for vomiting x 3 weeks of unknown origin, significant weight loss over the past few months. On eliquis.and has not missed any doses.   Recent vs readings since change in sx  141/89, 92 124/91, 95 4/26 130/80. HR 111 9 pm 156/105, HR 111   Has appt to see July 12/31/19.   Pt is seeing PCP tomorrow morning for GI complaints.   Made appt for tomorrow PM with Dr. [name].

## 2019-11-04 NOTE — Telephone Encounter (Signed)
Patient c/o Palpitations:  High priority if patient c/o lightheadedness, shortness of breath, or chest pain  1) How long have you had palpitations/irregular HR/ Afib? Are you having the symptoms now? Since yesterday   2) Are you currently experiencing lightheadedness, SOB or CP? A little dizzy   3) Do you have a history of afib (atrial fibrillation) or irregular heart rhythm? yes  4) Have you checked your BP or HR? (document readings if available): normally HR in 50-60s - now 100-110  5) Are you experiencing any other symptoms? Dizziness and nausea - no chest pain or SOB

## 2019-11-05 ENCOUNTER — Telehealth: Payer: Self-pay | Admitting: Cardiovascular Disease

## 2019-11-05 ENCOUNTER — Ambulatory Visit: Payer: Medicare Other | Admitting: Cardiovascular Disease

## 2019-11-05 ENCOUNTER — Encounter: Payer: Self-pay | Admitting: Family

## 2019-11-05 ENCOUNTER — Encounter: Payer: Self-pay | Admitting: Emergency Medicine

## 2019-11-05 ENCOUNTER — Other Ambulatory Visit: Payer: Self-pay

## 2019-11-05 ENCOUNTER — Emergency Department: Payer: Medicare Other

## 2019-11-05 ENCOUNTER — Emergency Department
Admission: EM | Admit: 2019-11-05 | Discharge: 2019-11-05 | Disposition: A | Discharge status: Home / Self Care | Payer: Medicare Other | Attending: Student in an Organized Health Care Education/Training Program | Admitting: Student in an Organized Health Care Education/Training Program

## 2019-11-05 ENCOUNTER — Other Ambulatory Visit: Payer: Medicare Other

## 2019-11-05 ENCOUNTER — Ambulatory Visit (INDEPENDENT_AMBULATORY_CARE_PROVIDER_SITE_OTHER): Payer: Medicare Other | Admitting: Family

## 2019-11-05 VITALS — BP 169/82 | HR 67 | Temp 96.5°F | Ht 65.0 in | Wt 311.4 lb

## 2019-11-05 DIAGNOSIS — K8020 Calculus of gallbladder without cholecystitis without obstruction: Secondary | ICD-10-CM | POA: Diagnosis not present

## 2019-11-05 DIAGNOSIS — I110 Hypertensive heart disease with heart failure: Secondary | ICD-10-CM | POA: Insufficient documentation

## 2019-11-05 DIAGNOSIS — J449 Chronic obstructive pulmonary disease, unspecified: Secondary | ICD-10-CM | POA: Diagnosis not present

## 2019-11-05 DIAGNOSIS — Z7901 Long term (current) use of anticoagulants: Secondary | ICD-10-CM | POA: Insufficient documentation

## 2019-11-05 DIAGNOSIS — I480 Paroxysmal atrial fibrillation: Secondary | ICD-10-CM | POA: Diagnosis not present

## 2019-11-05 DIAGNOSIS — R0789 Other chest pain: Secondary | ICD-10-CM | POA: Diagnosis not present

## 2019-11-05 DIAGNOSIS — R079 Chest pain, unspecified: Secondary | ICD-10-CM | POA: Diagnosis not present

## 2019-11-05 DIAGNOSIS — I4891 Unspecified atrial fibrillation: Secondary | ICD-10-CM

## 2019-11-05 DIAGNOSIS — Z87891 Personal history of nicotine dependence: Secondary | ICD-10-CM | POA: Diagnosis not present

## 2019-11-05 DIAGNOSIS — R110 Nausea: Secondary | ICD-10-CM | POA: Diagnosis not present

## 2019-11-05 DIAGNOSIS — R1011 Right upper quadrant pain: Secondary | ICD-10-CM | POA: Diagnosis not present

## 2019-11-05 DIAGNOSIS — I509 Heart failure, unspecified: Secondary | ICD-10-CM | POA: Diagnosis not present

## 2019-11-05 DIAGNOSIS — Z79899 Other long term (current) drug therapy: Secondary | ICD-10-CM | POA: Insufficient documentation

## 2019-11-05 DIAGNOSIS — R42 Dizziness and giddiness: Secondary | ICD-10-CM | POA: Insufficient documentation

## 2019-11-05 DIAGNOSIS — Z743 Need for continuous supervision: Secondary | ICD-10-CM | POA: Diagnosis not present

## 2019-11-05 DIAGNOSIS — R1084 Generalized abdominal pain: Secondary | ICD-10-CM | POA: Diagnosis not present

## 2019-11-05 DIAGNOSIS — I4819 Other persistent atrial fibrillation: Secondary | ICD-10-CM

## 2019-11-05 DIAGNOSIS — R112 Nausea with vomiting, unspecified: Secondary | ICD-10-CM | POA: Diagnosis not present

## 2019-11-05 LAB — COMPREHENSIVE METABOLIC PANEL
ALT: 35 U/L (ref 0–44)
AST: 33 U/L (ref 15–41)
Albumin: 4 g/dL (ref 3.5–5.0)
Alkaline Phosphatase: 88 U/L (ref 38–126)
Anion gap: 9 (ref 5–15)
BUN: 12 mg/dL (ref 8–23)
CO2: 29 mmol/L (ref 22–32)
Calcium: 9 mg/dL (ref 8.9–10.3)
Chloride: 102 mmol/L (ref 98–111)
Creatinine, Ser: 0.85 mg/dL (ref 0.44–1.00)
GFR calc Af Amer: >60 mL/min (ref >60)
GFR calc non Af Amer: >60 mL/min (ref >60)
Glucose, Bld: 101 mg/dL — ABNORMAL HIGH (ref 70–99)
Potassium: 3.6 mmol/L (ref 3.5–5.1)
Sodium: 140 mmol/L (ref 135–145)
Total Bilirubin: 1 mg/dL (ref 0.3–1.2)
Total Protein: 7.6 g/dL (ref 6.5–8.1)

## 2019-11-05 LAB — URINALYSIS, COMPLETE (UACMP) WITH MICROSCOPIC
Bacteria, UA: NONE SEEN
Bilirubin Urine: NEGATIVE
Glucose, UA: NEGATIVE mg/dL
Hgb urine dipstick: NEGATIVE
Ketones, ur: NEGATIVE mg/dL
Leukocytes,Ua: NEGATIVE
Nitrite: NEGATIVE
Protein, ur: NEGATIVE mg/dL
Specific Gravity, Urine: 1.012 (ref 1.005–1.030)
pH: 6 (ref 5.0–8.0)

## 2019-11-05 LAB — CBC
HCT: 47.9 % — ABNORMAL HIGH (ref 36.0–46.0)
Hemoglobin: 15.7 g/dL — ABNORMAL HIGH (ref 12.0–15.0)
MCH: 29 pg (ref 26.0–34.0)
MCHC: 32.8 g/dL (ref 30.0–36.0)
MCV: 88.5 fL (ref 80.0–100.0)
Platelets: 217 10*3/uL (ref 150–400)
RBC: 5.41 MIL/uL — ABNORMAL HIGH (ref 3.87–5.11)
RDW: 14.6 % (ref 11.5–15.5)
WBC: 9 10*3/uL (ref 4.0–10.5)
nRBC: 0 % (ref 0.0–0.2)

## 2019-11-05 LAB — BRAIN NATRIURETIC PEPTIDE: B Natriuretic Peptide: 162 pg/mL — ABNORMAL HIGH (ref 0.0–100.0)

## 2019-11-05 LAB — LIPASE, BLOOD: Lipase: 19 U/L (ref 11–51)

## 2019-11-05 LAB — TROPONIN I (HIGH SENSITIVITY)
Troponin I (High Sensitivity): 3 ng/L (ref <18)
Troponin I (High Sensitivity): <2 ng/L (ref <18)

## 2019-11-05 LAB — MAGNESIUM: Magnesium: 1.8 mg/dL (ref 1.7–2.4)

## 2019-11-05 MED ORDER — METOPROLOL TARTRATE 25 MG PO TABS: 25.0000 mg | ORAL_TABLET | Freq: Once | ORAL | Status: DC

## 2019-11-05 MED ORDER — MAGNESIUM SULFATE 2 GM/50ML IV SOLN
2.0000 g | Freq: Once | INTRAVENOUS | Status: AC
  Administered 2019-11-05: 2 g via INTRAVENOUS
  Filled 2019-11-05: qty 50

## 2019-11-05 MED ORDER — BISOPROLOL FUMARATE 5 MG PO TABS
5.0000 mg | ORAL_TABLET | Freq: Every day | ORAL | Status: DC
  Administered 2019-11-05: 5 mg via ORAL
  Filled 2019-11-05 (×2): qty 1

## 2019-11-05 MED ORDER — PROMETHAZINE HCL 25 MG/ML IJ SOLN
12.5000 mg | Freq: Four times a day (QID) | INTRAMUSCULAR | Status: DC | PRN
  Administered 2019-11-05: 12.5 mg via INTRAVENOUS
  Filled 2019-11-05: qty 1

## 2019-11-05 MED ORDER — SODIUM CHLORIDE 0.9% FLUSH: 3.0000 mL | Freq: Once | INTRAVENOUS | Status: DC

## 2019-11-05 MED ORDER — LIDOCAINE VISCOUS HCL 2 % MT SOLN
15.0000 mL | Freq: Once | OROMUCOSAL | Status: AC
  Administered 2019-11-05: 15 mL via ORAL
  Filled 2019-11-05: qty 15

## 2019-11-05 MED ORDER — BISOPROLOL FUMARATE 5 MG PO TABS: 5.0000 mg | ORAL_TABLET | Freq: Every day | ORAL | 1 refills | Status: DC

## 2019-11-05 MED ORDER — METOPROLOL TARTRATE 5 MG/5ML IV SOLN
5.0000 mg | Freq: Once | INTRAVENOUS | Status: AC
  Administered 2019-11-05: 5 mg via INTRAVENOUS
  Filled 2019-11-05: qty 5

## 2019-11-05 MED ORDER — ALUM & MAG HYDROXIDE-SIMETH 200-200-20 MG/5ML PO SUSP
30.0000 mL | Freq: Once | ORAL | Status: AC
  Administered 2019-11-05: 30 mL via ORAL
  Filled 2019-11-05: qty 30

## 2019-11-05 MED ORDER — IOHEXOL 300 MG/ML  SOLN
125.0000 mL | Freq: Once | INTRAMUSCULAR | Status: AC | PRN
  Administered 2019-11-05: 125 mL via INTRAVENOUS
  Filled 2019-11-05: qty 125

## 2019-11-05 NOTE — ED Triage Notes (Signed)
Brought by ems for nausea.  Says she was sent to ED for this few days ago.  She was at [facility] office today and she todl the dr she had some chest pain so they sent her back here.  Says her doctor also wants her to have abd ct scan.

## 2019-11-05 NOTE — ED Notes (Signed)
Pt given a cup of water and ginger ale to drink per Dr. Montine Circle to encourage fluids.

## 2019-11-05 NOTE — ED Provider Notes (Signed)
[facility] Emergency Department Provider Note    First MD Initiated Contact with Patient 11/05/19 1404     (approximate)  I have reviewed the triage vital signs and the nursing notes.   HISTORY  Chief Complaint Chest Pain, Abdominal Pain, and Nausea    HPI Amber Barnes is a 69 y.o. female   with the below listed past medical history presents from primary care physician's office with chief complaint of several weeks of intractable nausea decreased p.o. intake chest pain with palpitations as well as epigastric abdominal pain.  States she was seen for lightheadedness and dizziness with some improvement after Phenergan was discharged home with that but is still having no resolution of the symptoms.  She does have a history of atrial fibrillation has followed up with cardiology.  She is on Eliquis.  She has been try to take her medications as well as potassium supplement but is not always able to keep it down.  She reports 40 pound weight loss in 6 months.   Past Medical History:  Diagnosis Date  . Achilles tendinitis   . Acute medial meniscal tear   . Allergy   . Anxiety   . Arthritis   . Atrial fibrillation ([place])    a. new onset 07/2016; b. CHADS2VASc --> 2 (HTN, female); c. eliquis  . CHF (congestive heart failure) ([place])   . Chronic lower back pain   . COPD (chronic obstructive pulmonary disease) ([place])   . Depression   . Fibrocystic breast disease   . GERD (gastroesophageal reflux disease)   . Hyperlipidemia   . Hypertension   . Insomnia   . Lipoma   . Migraine   . Osteoarthritis    left knee  . Osteopenia   . Sleep apnea    does not use her cpap  . Tendonitis   . Thyroid disease   . Vitamin D deficiency    Family History  Problem Relation Age of Onset  . Cancer Sister 12       ovarian  . Depression Sister   . Cancer Father 43       colon  . Heart disease Father   . Heart attack Father 67       died from MI  . Hyperlipidemia  Father   . Hypertension Father   . Cancer Mother        leukemia  . Breast cancer Paternal Aunt        47's  . Migraines Neg Hx    Past Surgical History:  Procedure Laterality Date  . ABDOMINAL HYSTERECTOMY     ovaries left  . ACHILLES TENDON SURGERY     2012,01/2017  . BACK SURGERY    . CARDIOVERSION N/A 09/07/2016   Procedure: CARDIOVERSION;  Surgeon: Minna Merritts, MD;  Location: [facility] ORS;  Service: Cardiovascular;  Laterality: N/A;  . CARDIOVERSION N/A 10/19/2016   Procedure: CARDIOVERSION;  Surgeon: Minna Merritts, MD;  Location: [facility] ORS;  Service: Cardiovascular;  Laterality: N/A;  . CARDIOVERSION N/A 12/14/2016   Procedure: CARDIOVERSION;  Surgeon: Minna Merritts, MD;  Location: [facility] ORS;  Service: Cardiovascular;  Laterality: N/A;  . COLONOSCOPY    . ESOPHAGOGASTRODUODENOSCOPY    . THYROIDECTOMY     thyroid lobectomy secondary to a benign nodule  . TOTAL HIP ARTHROPLASTY Right 08/21/2018   Procedure: TOTAL HIP ARTHROPLASTY ANTERIOR APPROACH-RIGHT CELL SAVER REQUESTED;  Surgeon: [name], MD;  Location: [facility] ORS;  Service: Orthopedics;  Laterality: Right;  Patient Active Problem List   Diagnosis Date Noted  . Nausea 10/30/2019  . Major depression in remission ([place]) 08/27/2018  . Primary osteoarthritis involving multiple joints 08/27/2018  . Atrial fibrillation ([place]) 08/27/2018  . COPD (chronic obstructive pulmonary disease) ([place]) 08/27/2018  . BMI 50.0-59.9, adult ([place]) 07/16/2018  . Morbid obesity with BMI of 50.0-59.9, adult ([place]) 05/31/2018  . Bradycardia 05/22/2017  . OSA (obstructive sleep apnea) 01/09/2017  . Hypertension 11/11/2014      Prior to Admission medications   Medication Sig Start Date End Date Taking? Authorizing Provider  acetaminophen (TYLENOL) 500 MG tablet Take 500 mg by mouth every 6 (six) hours as needed. Taking as needed    [provider]  albuterol (VENTOLIN HFA) 108 (90 Base) MCG/ACT inhaler INHALE 2 PUFFS INTO THE  LUNGS EVERY 8 (EIGHT) HOURS AS NEEDED FOR WHEEZING OR SHORTNESS OF BREATH. 04/04/19   [name], FNP  ALPRAZolam (XANAX) 0.25 MG tablet TAKE 1 TABLET BY MOUTH DAILY AS NEEDED FOR ANXIETY OR SLEEP. 07/31/19   [name], FNP  amiodarone (PACERONE) 200 MG tablet TAKE 1 TABLET BY MOUTH EVERY DAY 01/09/19   Minna Merritts, MD  B Complex Vitamins (VITAMIN B COMPLEX PO) Take 1 tablet by mouth daily.     [provider]  bisoprolol (ZEBETA) 5 MG tablet Take 1 tablet (5 mg total) by mouth daily. 11/05/19   Merlyn Lot, MD  calcium gluconate 500 MG tablet Take 500 tablets by mouth daily.    [provider]  Cholecalciferol (VITAMIN D3) 5000 units CAPS Take 5,000 Units by mouth daily.    [provider]  ELIQUIS 5 MG TABS tablet TAKE 1 TABLET BY MOUTH TWICE A DAY 09/09/19   Minna Merritts, MD  esomeprazole (NEXIUM) 20 MG capsule Take 20 mg by mouth daily as needed (acid reflux).    [provider]  furosemide (LASIX) 20 MG tablet TAKE 1 TABLET (20 MG TOTAL) BY MOUTH 2 (TWO) TIMES DAILY. 10/04/18   Minna Merritts, MD  hydrochlorothiazide (MICROZIDE) 12.5 MG capsule Take 1 capsule (12.5 mg total) by mouth daily. 09/16/19 12/15/19  Loel Dubonnet, NP  losartan (COZAAR) 100 MG tablet TAKE 1 TABLET BY MOUTH EVERY DAY 08/29/19   Minna Merritts, MD  Multiple Vitamin (MULTIVITAMIN) tablet Take 1 tablet by mouth daily.    [provider]  oxymetazoline (AFRIN) 0.05 % nasal spray Place 1 spray into both nostrils at bedtime as needed for congestion.     [provider]  potassium chloride (K-DUR) 10 MEQ tablet TAKE 1 TABLET BY MOUTH TWICE A DAY 01/10/19   Minna Merritts, MD  promethazine (PHENERGAN) 12.5 MG tablet Take 1 tablet (12.5 mg total) by mouth every 6 (six) hours as needed for nausea or vomiting. 10/30/19   Lavonia Drafts, MD  ranolazine (RANEXA) 500 MG 12 hr tablet TAKE 1 TABLET BY MOUTH TWICE A DAY 08/26/19   Gollan, [name], MD    rizatriptan (MAXALT) 10 MG tablet TAKE 1 TABLET (10 MG TOTAL) BY MOUTH AS NEEDED FOR MIGRAINE. MAY REPEAT IN 2 HOURS IF NEEDED 03/13/18   Melvenia Beam, MD  rOPINIRole (REQUIP) 0.25 MG tablet Take 1 tablet (0.25 mg total) by mouth every evening. 10/18/19   [name], FNP  rosuvastatin (CRESTOR) 20 MG tablet TAKE 1 TABLET BY MOUTH EVERY DAY 08/26/19   [name], FNP  sertraline (ZOLOFT) 100 MG tablet TAKE 1.5 TABLETS BY MOUTH ONCE DAILY  05/10/19   [name], FNP    Allergies Prednisone, Hydrocodone-acetaminophen, Oxycodone, Tramadol, Adhesive [tape], and Latex    Social History Social History   Tobacco Use  . Smoking status: Former Smoker    Packs/day: 0.25    Years: 10.00    Pack years: 2.50    Types: Cigarettes    Quit date: 2005    Years since quitting: 16.3  . Smokeless tobacco: Never Used  . Tobacco comment: smoked for 10 years, 1 pack every 2-3 days  Substance Use Topics  . Alcohol use: Yes    Comment: social, occasional  . Drug use: No    Review of Systems Patient denies headaches, rhinorrhea, blurry vision, numbness, shortness of breath, chest pain, edema, cough, abdominal pain, nausea, vomiting, diarrhea, dysuria, fevers, rashes or hallucinations unless otherwise stated above in HPI. ____________________________________________   PHYSICAL EXAM:  VITAL SIGNS: Vitals:   11/05/19 1930 11/05/19 1951  BP: 109/73 109/73  Pulse:  96  Resp: 19 18  Temp:    SpO2:  96%    Constitutional: Alert and oriented.  Eyes: Conjunctivae are normal.  Head: Atraumatic. Nose: No congestion/rhinnorhea. Mouth/Throat: Mucous membranes are dry Neck: No stridor. Painless ROM.  Cardiovascular: tachycardic irregular rhythm. Grossly normal heart sounds.  Good peripheral circulation. Respiratory: Normal respiratory effort.  No retractions. Lungs CTAB. Gastrointestinal: Soft and nontender. No distention. No abdominal bruits. No CVA tenderness. Genitourinary:   Musculoskeletal: No lower extremity tenderness nor edema.  No joint effusions. Neurologic:  Normal speech and language. No gross focal neurologic deficits are appreciated. No facial droop Skin:  Skin is warm, dry and intact. No rash noted. Psychiatric: Mood and affect are normal. Speech and behavior are normal.  ____________________________________________   LABS (all labs ordered are listed, but only abnormal results are displayed)  Results for orders placed or performed during the hospital encounter of 11/05/19 (from the past 24 hour(s))  Lipase, blood     Status: None   Collection Time: 11/05/19  2:36 PM  Result Value Ref Range   Lipase 19 11 - 51 U/L  Comprehensive metabolic panel     Status: Abnormal   Collection Time: 11/05/19  2:36 PM  Result Value Ref Range   Sodium 140 135 - 145 mmol/L   Potassium 3.6 3.5 - 5.1 mmol/L   Chloride 102 98 - 111 mmol/L   CO2 29 22 - 32 mmol/L   Glucose, Bld 101 (H) 70 - 99 mg/dL   BUN 12 8 - 23 mg/dL   Creatinine, Ser 0.85 0.44 - 1.00 mg/dL   Calcium 9.0 8.9 - 10.3 mg/dL   Total Protein 7.6 6.5 - 8.1 g/dL   Albumin 4.0 3.5 - 5.0 g/dL   AST 33 15 - 41 U/L   ALT 35 0 - 44 U/L   Alkaline Phosphatase 88 38 - 126 U/L   Total Bilirubin 1.0 0.3 - 1.2 mg/dL   GFR calc non Af Amer >60 >60 mL/min   GFR calc Af Amer >60 >60 mL/min   Anion gap 9 5 - 15  CBC     Status: Abnormal   Collection Time: 11/05/19  2:36 PM  Result Value Ref Range   WBC 9.0 4.0 - 10.5 K/uL   RBC 5.41 (H) 3.87 - 5.11 MIL/uL   Hemoglobin 15.7 (H) 12.0 - 15.0 g/dL   HCT 47.9 (H) 36.0 - 46.0 %   MCV 88.5 80.0 - 100.0 fL   MCH 29.0 26.0 - 34.0 pg  MCHC 32.8 30.0 - 36.0 g/dL   RDW 14.6 11.5 - 15.5 %   Platelets 217 150 - 400 K/uL   nRBC 0.0 0.0 - 0.2 %  Troponin I (High Sensitivity)     Status: None   Collection Time: 11/05/19  2:36 PM  Result Value Ref Range   Troponin I (High Sensitivity) 3 <18 ng/L  Magnesium     Status: None   Collection Time: 11/05/19  2:36 PM   Result Value Ref Range   Magnesium 1.8 1.7 - 2.4 mg/dL  Brain natriuretic peptide     Status: Abnormal   Collection Time: 11/05/19  2:36 PM  Result Value Ref Range   B Natriuretic Peptide 162.0 (H) 0.0 - 100.0 pg/mL  Urinalysis, Complete w Microscopic     Status: Abnormal   Collection Time: 11/05/19  3:41 PM  Result Value Ref Range   Color, Urine YELLOW (A) YELLOW   APPearance CLEAR (A) CLEAR   Specific Gravity, Urine 1.012 1.005 - 1.030   pH 6.0 5.0 - 8.0   Glucose, UA NEGATIVE NEGATIVE mg/dL   Hgb urine dipstick NEGATIVE NEGATIVE   Bilirubin Urine NEGATIVE NEGATIVE   Ketones, ur NEGATIVE NEGATIVE mg/dL   Protein, ur NEGATIVE NEGATIVE mg/dL   Nitrite NEGATIVE NEGATIVE   Leukocytes,Ua NEGATIVE NEGATIVE   RBC / HPF 0-5 0 - 5 RBC/hpf   WBC, UA 0-5 0 - 5 WBC/hpf   Bacteria, UA NONE SEEN NONE SEEN   Squamous Epithelial / LPF 0-5 0 - 5   Mucus PRESENT    Hyaline Casts, UA PRESENT   Troponin I (High Sensitivity)     Status: None   Collection Time: 11/05/19  4:42 PM  Result Value Ref Range   Troponin I (High Sensitivity) <2 <18 ng/L   ____________________________________________  EKG My review and personal interpretation at Time: 13:50   Indication: nausea  Rate: 115  Rhythm: afib Axis: normal Other: nonspecific st abn ____________________________________________  RADIOLOGY  I personally reviewed all radiographic images ordered to evaluate for the above acute complaints and reviewed radiology reports and findings.  These findings were personally discussed with the patient.  Please see medical record for radiology report.  ____________________________________________   PROCEDURES  Procedure(s) performed:  Procedures    Critical Care performed: no ____________________________________________   INITIAL IMPRESSION / ASSESSMENT AND PLAN / ED COURSE  Pertinent labs & imaging results that were available during my care of the patient were reviewed by me and considered in  my medical decision making (see chart for details).   DDX: Enteritis, electrolyte abnormality, gastritis, SBO, hernia, diverticulitis, pneumonia, CHF, anemia  Amber Barnes is a 69 y.o. who presents to the ED with symptoms as described above.  Patient afebrile borderline tachycardic does have history of A. fib and her EKG shows evidence of A. fib with nonspecific ST changes.  Have a lower suspicion for ACS.  She is having some epigastric pain possible gastritis.  Blood work from outpatient work-ups have been fairly reassuring with the patient's endorsing lightheadedness worsening of chronic dyspnea persistent daily nausea with vomiting and 40 pound weight loss over the past several months.  The patient will be placed on continuous pulse oximetry and telemetry for monitoring.  Laboratory evaluation will be sent to evaluate for the above complaints.     Clinical Course as of Nov 04 1952  Tue Nov 05, 2019  1623 Her blood work fortunately is fairly reassuring.  No sign of acute intra-abdominal process.  On review  medications obstructive question whether this could be related to amiodarone toxicity.   [PR]  [id number] Patient's repeat cardiac enzyme is negative.  Discussed the case with Dr. Saunders Revel who agrees with plan to hold amiodarone has recommended increasing her Zebeta to 5mg .  We will observe in the ER after receiving is a beta see if we have appropriate rate control.   [PR]  1805 Patient feels improved and was requesting something to eat.  Will p.o. challenge with applesauce.   [PR]  [id number] Patient now eating food tray feeling significantly improved.   [PR]  1947 Patient feels well.  She is tolerating p.o.  Her heart rate is controlled.  Nonischemic troponins.  Her cardiology clinic has already called for arranging close outpatient follow-up tomorrow morning.  This point I do believe she stable and appropriate for outpatient follow-up.   [PR]    Clinical Course User Index [PR] Merlyn Lot, MD     The patient was evaluated in Emergency Department today for the symptoms described in the history of present illness. He/she was evaluated in the context of the global COVID-19 pandemic, which necessitated consideration that the patient might be at risk for infection with the SARS-CoV-2 virus that causes COVID-19. Institutional protocols and algorithms that pertain to the evaluation of patients at risk for COVID-19 are in a state of rapid change based on information released by regulatory bodies including the CDC and federal and state organizations. These policies and algorithms were followed during the patient's care in the ED.  As part of my medical decision making, I reviewed the following data within the [place] notes reviewed and incorporated, Labs reviewed, notes from prior ED visits and [place] Controlled Substance Database   ____________________________________________   FINAL CLINICAL IMPRESSION(S) / ED DIAGNOSES  Final diagnoses:  Atrial fibrillation with rapid ventricular response (HCC)  Nausea  Generalized abdominal pain      NEW MEDICATIONS STARTED DURING THIS VISIT:  Current Discharge Medication List       Note:  This document was prepared using Dragon voice recognition software and may include unintentional dictation errors.    Merlyn Lot, MD 11/05/19 [phone]

## 2019-11-05 NOTE — Telephone Encounter (Signed)
Needs fu visit next week

## 2019-11-05 NOTE — Progress Notes (Signed)
Subjective:    Patient ID: Amber Barnes, female    DOB: May 09, 1951, 69 y.o.   MRN: [id number]  CC: Amber Barnes is a 69 y.o. female who presents today for follow up.   HPI: ED follow up Continue to have nausea, worse with eating.  Has been eating crackers and potatoe soup. Any food makes her nauseated.   NO abdominal pain, fever. Has been taking zyrtec, doesn't think has decongestant component however not sure.  Congestion has improved with zyrtec.   Endorses 'hot flashes' which started 4 days ago. Perspires around hair line and back of head.  Not drenching the sheet. H/o hysterectomy  Went to ED 10/30/19 for nausea. Suspected gastritis/ pud. Given phenergan without relief.   HTN - compliant with medications. No CP, left arm pain, sob.   Atrial fibrillation- can appreciate palpitations.  Appointment with Dr [name] at 320 pm today CXR cardiomegaly 10/30/19 Dr [name] appointment end of July. Has appointment with [facility] end of June.   HISTORY:  Past Medical History:  Diagnosis Date  . Achilles tendinitis   . Acute medial meniscal tear   . Allergy   . Anxiety   . Arthritis   . Atrial fibrillation ([place])    a. new onset 07/2016; b. CHADS2VASc --> 2 (HTN, female); c. eliquis  . CHF (congestive heart failure) ([place])   . Chronic lower back pain   . COPD (chronic obstructive pulmonary disease) ([place])   . Depression   . Fibrocystic breast disease   . GERD (gastroesophageal reflux disease)   . Hyperlipidemia   . Hypertension   . Insomnia   . Lipoma   . Migraine   . Osteoarthritis    left knee  . Osteopenia   . Sleep apnea    does not use her cpap  . Tendonitis   . Thyroid disease   . Vitamin D deficiency    Past Surgical History:  Procedure Laterality Date  . ABDOMINAL HYSTERECTOMY     ovaries left  . ACHILLES TENDON SURGERY     2012,01/2017  . BACK SURGERY    . CARDIOVERSION N/A 09/07/2016   Procedure: CARDIOVERSION;  Surgeon: Minna Merritts, MD;   Location: [facility] ORS;  Service: Cardiovascular;  Laterality: N/A;  . CARDIOVERSION N/A 10/19/2016   Procedure: CARDIOVERSION;  Surgeon: Minna Merritts, MD;  Location: [facility] ORS;  Service: Cardiovascular;  Laterality: N/A;  . CARDIOVERSION N/A 12/14/2016   Procedure: CARDIOVERSION;  Surgeon: Minna Merritts, MD;  Location: [facility] ORS;  Service: Cardiovascular;  Laterality: N/A;  . COLONOSCOPY    . ESOPHAGOGASTRODUODENOSCOPY    . THYROIDECTOMY     thyroid lobectomy secondary to a benign nodule  . TOTAL HIP ARTHROPLASTY Right 08/21/2018   Procedure: TOTAL HIP ARTHROPLASTY ANTERIOR APPROACH-RIGHT CELL SAVER REQUESTED;  Surgeon: [name], MD;  Location: [facility] ORS;  Service: Orthopedics;  Laterality: Right;   Family History  Problem Relation Age of Onset  . Cancer Sister 38       ovarian  . Depression Sister   . Cancer Father 7       colon  . Heart disease Father   . Heart attack Father 63       died from MI  . Hyperlipidemia Father   . Hypertension Father   . Cancer Mother        leukemia  . Breast cancer Paternal Aunt        78's  . Migraines Neg Hx     Allergies: Prednisone,  Hydrocodone-acetaminophen, Oxycodone, Tramadol, Adhesive [tape], and Latex No current facility-administered medications on file prior to visit.   Current Outpatient Medications on File Prior to Visit  Medication Sig Dispense Refill  . acetaminophen (TYLENOL) 500 MG tablet Take 500 mg by mouth every 6 (six) hours as needed. Taking as needed    . albuterol (VENTOLIN HFA) 108 (90 Base) MCG/ACT inhaler INHALE 2 PUFFS INTO THE LUNGS EVERY 8 (EIGHT) HOURS AS NEEDED FOR WHEEZING OR SHORTNESS OF BREATH. 6.7 g 0  . ALPRAZolam (XANAX) 0.25 MG tablet TAKE 1 TABLET BY MOUTH DAILY AS NEEDED FOR ANXIETY OR SLEEP. 30 tablet 1  . amiodarone (PACERONE) 200 MG tablet TAKE 1 TABLET BY MOUTH EVERY DAY 90 tablet 3  . B Complex Vitamins (VITAMIN B COMPLEX PO) Take 1 tablet by mouth daily.     . bisoprolol (ZEBETA) 5 MG tablet  Take 0.5 tablets (2.5 mg total) by mouth daily. 90 tablet 1  . calcium gluconate 500 MG tablet Take 500 tablets by mouth daily.    . Cholecalciferol (VITAMIN D3) 5000 units CAPS Take 5,000 Units by mouth daily.    Marland Kitchen ELIQUIS 5 MG TABS tablet TAKE 1 TABLET BY MOUTH TWICE A DAY 60 tablet 3  . esomeprazole (NEXIUM) 20 MG capsule Take 20 mg by mouth daily as needed (acid reflux).    . furosemide (LASIX) 20 MG tablet TAKE 1 TABLET (20 MG TOTAL) BY MOUTH 2 (TWO) TIMES DAILY. 180 tablet 3  . hydrochlorothiazide (MICROZIDE) 12.5 MG capsule Take 1 capsule (12.5 mg total) by mouth daily. 30 capsule 2  . losartan (COZAAR) 100 MG tablet TAKE 1 TABLET BY MOUTH EVERY DAY 90 tablet 3  . Multiple Vitamin (MULTIVITAMIN) tablet Take 1 tablet by mouth daily.    Marland Kitchen oxymetazoline (AFRIN) 0.05 % nasal spray Place 1 spray into both nostrils at bedtime as needed for congestion.     . potassium chloride (K-DUR) 10 MEQ tablet TAKE 1 TABLET BY MOUTH TWICE A DAY 180 tablet 1  . promethazine (PHENERGAN) 12.5 MG tablet Take 1 tablet (12.5 mg total) by mouth every 6 (six) hours as needed for nausea or vomiting. 30 tablet 0  . ranolazine (RANEXA) 500 MG 12 hr tablet TAKE 1 TABLET BY MOUTH TWICE A DAY 180 tablet 0  . rizatriptan (MAXALT) 10 MG tablet TAKE 1 TABLET (10 MG TOTAL) BY MOUTH AS NEEDED FOR MIGRAINE. MAY REPEAT IN 2 HOURS IF NEEDED 10 tablet 0  . rOPINIRole (REQUIP) 0.25 MG tablet Take 1 tablet (0.25 mg total) by mouth every evening. 90 tablet 1  . rosuvastatin (CRESTOR) 20 MG tablet TAKE 1 TABLET BY MOUTH EVERY DAY 90 tablet 0  . sertraline (ZOLOFT) 100 MG tablet TAKE 1.5 TABLETS BY MOUTH ONCE DAILY 135 tablet 2    Social History   Tobacco Use  . Smoking status: Former Smoker    Packs/day: 0.25    Years: 10.00    Pack years: 2.50    Types: Cigarettes    Quit date: 2005    Years since quitting: 16.3  . Smokeless tobacco: Never Used  . Tobacco comment: smoked for 10 years, 1 pack every 2-3 days  Substance Use  Topics  . Alcohol use: Yes    Comment: social, occasional  . Drug use: No    Review of Systems  Constitutional: Negative for chills and fever.  HENT: Negative for congestion and trouble swallowing.   Respiratory: Negative for cough.   Cardiovascular: Positive for palpitations. Negative for chest  pain.  Gastrointestinal: Positive for abdominal distention and abdominal pain. Negative for constipation, diarrhea, nausea and vomiting.      Objective:    BP (!) 169/82   Pulse 67   Temp (!) 96.5 F (35.8 C) (Temporal)   Ht 5\' 5"  (1.651 m)   Wt (!) 311 lb 6.4 oz (141.3 kg)   SpO2 96%   BMI 51.82 kg/m  BP Readings from Last 3 Encounters:  11/05/19 110/80  11/05/19 (!) 169/82  10/30/19 (!) 175/76   Wt Readings from Last 3 Encounters:  11/05/19 (!) 311 lb 6.4 oz (141.3 kg)  10/30/19 (!) 314 lb (142.4 kg)  10/30/19 (!) 314 lb (142.4 kg)    Physical Exam Vitals reviewed.  Constitutional:      Appearance: Normal appearance. She is well-developed.  Eyes:     Conjunctiva/sclera: Conjunctivae normal.  Cardiovascular:     Rate and Rhythm: Normal rate and regular rhythm.     Pulses: Normal pulses.     Heart sounds: Normal heart sounds.  Pulmonary:     Effort: Pulmonary effort is normal.     Breath sounds: Normal breath sounds. No wheezing, rhonchi or rales.  Abdominal:     General: Bowel sounds are normal. There is no distension.     Palpations: Abdomen is soft. Abdomen is not rigid. There is no fluid wave or mass.     Tenderness: There is abdominal tenderness in the right upper quadrant. There is no guarding or rebound.     Comments: Patient apprehensive with palpation particularly over the right upper quadrant of the abdomen.  Skin:    General: Skin is warm and dry.  Neurological:     Mental Status: She is alert.  Psychiatric:        Speech: Speech normal.        Behavior: Behavior normal.        Thought Content: Thought content normal.     Comments: Weak appearing.  Leaning over exam table.         Assessment & Plan:   Problem List Items Addressed This Visit      Cardiovascular and Mediastinum   Atrial fibrillation ([place])    Paroxsmal A. fib.  Patient appears to back in atrial fib today.  EKG was sent to cardiologist Dr [name] as patient had had an appointment with him today.  Unfortunately due to more acute nature of her nausea, patient was sent to the emergency room.        Other   Nausea - Primary    Unfortunately, patient continues to look unwell in clinic today.  She nor I felt comfortable with her going home.  I ordered a CT abdomen and pelvis which is scheduled for tomorrow however patient preferred,  which I agree,  with that she would like to be seen in the emergency room for more prompt evaluation of nausea.  Discussed with patient differentials including PUD, gastritis, GERD. ? Return of atrial fibrillation.      Relevant Orders   CT ABDOMEN PELVIS W CONTRAST    Other Visit Diagnoses    Calculus of gallbladder without cholecystitis without obstruction       Relevant Orders   CT ABDOMEN PELVIS W CONTRAST       I am having Amber Barnes "Amber Barnes" maintain her multivitamin, B Complex Vitamins (VITAMIN B COMPLEX PO), oxymetazoline, Vitamin D3, esomeprazole, calcium gluconate, rizatriptan, bisoprolol, furosemide, amiodarone, potassium chloride, albuterol, sertraline, ALPRAZolam, rosuvastatin, ranolazine, acetaminophen, losartan, Eliquis, hydrochlorothiazide, rOPINIRole, and  promethazine.   No orders of the defined types were placed in this encounter.   Return precautions given.   Risks, benefits, and alternatives of the medications and treatment plan prescribed today were discussed, and patient expressed understanding.   Education regarding symptom management and diagnosis given to patient on AVS.  Continue to follow with [name], FNP for routine health maintenance.   Amber Barnes and I agreed with plan.    [name], FNP

## 2019-11-05 NOTE — Assessment & Plan Note (Signed)
Unfortunately, patient continues to look unwell in clinic today.  She nor I felt comfortable with her going home.  I ordered a CT abdomen and pelvis which is scheduled for tomorrow however patient preferred,  which I agree,  with that she would like to be seen in the emergency room for more prompt evaluation of nausea.  Discussed with patient differentials including PUD, gastritis, GERD. ? Return of atrial fibrillation.

## 2019-11-05 NOTE — Patient Instructions (Addendum)
Please keep your appointment with Dr [name] today  Ct Ab tomorrow as discussed  Trial increase of Nexium from 20mg  daily to 40mg  daily for 2 weeks, let me know if ANY improvement here.   We will also work on  Getting GI appointment moved up.   Let me know how you are doing.

## 2019-11-05 NOTE — Assessment & Plan Note (Signed)
Paroxsmal A. fib.  Patient appears to back in atrial fib today.  EKG was sent to cardiologist Dr [name] as patient had had an appointment with him today.  Unfortunately due to more acute nature of her nausea, patient was sent to the emergency room.

## 2019-11-05 NOTE — Discharge Instructions (Signed)
Please hold your amiodarone.  Start taking Zebeta 5mg .

## 2019-11-06 ENCOUNTER — Ambulatory Visit: Admission: RE | Admit: 2019-11-06 | Payer: Medicare Other | Source: Ambulatory Visit

## 2019-11-06 ENCOUNTER — Encounter: Payer: Self-pay | Admitting: Family

## 2019-11-06 ENCOUNTER — Ambulatory Visit (INDEPENDENT_AMBULATORY_CARE_PROVIDER_SITE_OTHER): Payer: Medicare Other | Admitting: Family

## 2019-11-06 VITALS — BP 106/64 | HR 98 | Ht 65.0 in | Wt 311.0 lb

## 2019-11-06 DIAGNOSIS — E782 Mixed hyperlipidemia: Secondary | ICD-10-CM

## 2019-11-06 DIAGNOSIS — I4891 Unspecified atrial fibrillation: Secondary | ICD-10-CM

## 2019-11-06 DIAGNOSIS — R110 Nausea: Secondary | ICD-10-CM | POA: Diagnosis not present

## 2019-11-06 DIAGNOSIS — Z79899 Other long term (current) drug therapy: Secondary | ICD-10-CM

## 2019-11-06 DIAGNOSIS — I10 Essential (primary) hypertension: Secondary | ICD-10-CM

## 2019-11-06 DIAGNOSIS — Z7901 Long term (current) use of anticoagulants: Secondary | ICD-10-CM

## 2019-11-06 DIAGNOSIS — F419 Anxiety disorder, unspecified: Secondary | ICD-10-CM

## 2019-11-06 MED ORDER — PROMETHAZINE HCL 12.5 MG PO TABS: 12.5000 mg | ORAL_TABLET | Freq: Four times a day (QID) | ORAL | 0 refills | Status: DC | PRN

## 2019-11-06 NOTE — Patient Instructions (Addendum)
Medication Instructions:  No medication changes today.   *If you need a refill on your cardiac medications before your next appointment, please call your pharmacy*  Lab Work: No lab work today.   Testing/Procedures: Your EKG today shows rate controlled atrial fibrillation 98 bpm.    Follow-Up: At [facility], you and your health needs are our priority.  As part of our continuing mission to provide you with exceptional heart care, we have created designated Provider Care Teams.  These Care Teams include your primary Cardiologist (physician) and Advanced Practice Providers (APPs -  Physician Assistants and Nurse Practitioners) who all work together to provide you with the care you need, when you need it.  We recommend signing up for the patient portal called "MyChart".  Sign up information is provided on this After Visit Summary.  MyChart is used to connect with patients for Virtual Visits (Telemedicine).  Patients are able to view lab/test results, encounter notes, upcoming appointments, etc.  Non-urgent messages can be sent to your provider as well.   To learn more about what you can do with MyChart, go to [email or web address].    Your next appointment:   3 week(s)  The format for your next appointment:   In Person  Provider:   You may see Ida Rogue, MD or one of the following Advanced Practice Providers on your designated Care Team:    Murray Hodgkins, NP  Christell Faith, PA-C  Marrianne Mood, PA-C  Other Instructions  Be sure to drink lots of fluids. Would recommend some fluids with electrolytes such as Gatorade Zero. You can add some water to it to dilute it.   Be sure not to miss a dose of your Eliquis before your next appointment. This is very important if we are going to have to do a cardioversion to get you back in normal sinus rhythm.   Peppermint and ginger both can be very helpful in nausea.   Laurann Montana, NP will send you a MyChart message Monday to  check on your heart rate after the dose change of your Bisoprolol.    Nausea, Adult Nausea is feeling sick to your stomach or feeling that you are about to throw up (vomit). Feeling sick to your stomach is usually not serious, but it may be an early sign of a more serious medical problem. As you feel sicker to your stomach, you may throw up. If you throw up, or if you are not able to drink enough fluids, there is a risk that you may lose too much water in your body (get dehydrated). If you lose too much water in your body, you may:  Feel tired.  Feel thirsty.  Have a dry mouth.  Have cracked lips.  Go pee (urinate) less often. Older adults and people who have other diseases or a weak body defense system (immune system) have a higher risk of losing too much water in the body. The main goals of treating this condition are:  To relieve your nausea.  To ensure your nausea occurs less often.  To prevent throwing up and losing too much fluid. Follow these instructions at home: Watch your symptoms for any changes. Tell your doctor about them. Follow these instructions as told by your doctor. Eating and drinking      Take an ORS (oral rehydration solution). This is a drink that is sold at pharmacies and stores.  Drink clear fluids in small amounts as you are able. These include: ? Water. ? Ice chips. ?  Fruit juice that has water added (diluted fruit juice). ? Low-calorie sports drinks.  Eat bland, easy-to-digest foods in small amounts as you are able, such as: ? Bananas. ? Applesauce. ? Rice. ? Low-fat (lean) meats. ? Toast. ? Crackers.  Avoid drinking fluids that have a lot of sugar or caffeine in them. This includes energy drinks, sports drinks, and soda.  Avoid alcohol.  Avoid spicy or fatty foods. General instructions  Take over-the-counter and prescription medicines only as told by your doctor.  Rest at home while you get better.  Drink enough fluid to keep your  pee (urine) pale yellow.  Take slow and deep breaths when you feel sick to your stomach.  Avoid food or things that have strong smells.  Wash your hands often with soap and water. If you cannot use soap and water, use hand sanitizer.  Make sure that all people in your home wash their hands well and often.  Keep all follow-up visits as told by your doctor. This is important.   Atrial Fibrillation  Atrial fibrillation is a type of heartbeat that is irregular or fast. If you have this condition, your heart beats without any order. This makes it hard for your heart to pump blood in a normal way. Atrial fibrillation may come and go, or it may become a long-lasting problem. If this condition is not treated, it can put you at higher risk for stroke, heart failure, and other heart problems. What are the causes? This condition may be caused by diseases that damage the heart. They include:  High blood pressure.  Heart failure.  Heart valve disease.  Heart surgery. Other causes include:  Diabetes.  Thyroid disease.  Being overweight.  Kidney disease. Sometimes the cause is not known. What increases the risk? You are more likely to develop this condition if:  You are older.  You smoke.  You exercise often and very hard.  You have a family history of this condition.  You are a man.  You use drugs.  You drink a lot of alcohol.  You have lung conditions, such as emphysema, pneumonia, or COPD.  You have sleep apnea. What are the signs or symptoms? Common symptoms of this condition include:  A feeling that your heart is beating very fast.  Chest pain or discomfort.  Feeling short of breath.  Suddenly feeling light-headed or weak.  Getting tired easily during activity.  Fainting.  Sweating. In some cases, there are no symptoms. How is this treated? Treatment for this condition depends on underlying conditions and how you feel when you have atrial fibrillation.  They include:  Medicines to: ? Prevent blood clots. ? Treat heart rate or heart rhythm problems.  Using devices, such as a pacemaker, to correct heart rhythm problems.  Doing surgery to remove the part of the heart that sends bad signals.  Closing an area where clots can form in the heart (left atrial appendage). In some cases, your doctor will treat other underlying conditions. Follow these instructions at home: Medicines  Take over-the-counter and prescription medicines only as told by your doctor.  Do not take any new medicines without first talking to your doctor.  If you are taking blood thinners: ? Talk with your doctor before you take any medicines that have aspirin or NSAIDs, such as ibuprofen, in them. ? Take your medicine exactly as told by your doctor. Take it at the same time each day. ? Avoid activities that could hurt or bruise you. Follow  instructions about how to prevent falls. ? Wear a bracelet that says you are taking blood thinners. Or, carry a card that lists what medicines you take. Lifestyle      Do not use any products that have nicotine or tobacco in them. These include cigarettes, e-cigarettes, and chewing tobacco. If you need help quitting, ask your doctor.  Eat heart-healthy foods. Talk with your doctor about the right eating plan for you.  Exercise regularly as told by your doctor.  Do not drink alcohol.  Lose weight if you are overweight.  Do not use drugs, including cannabis. General instructions  If you have a condition that causes breathing to stop for a short period of time (apnea), treat it as told by your doctor.  Keep a healthy weight. Do not use diet pills unless your doctor says they are safe for you. Diet pills may make heart problems worse.  Keep all follow-up visits as told by your doctor. This is important. Contact a doctor if:  You notice a change in the speed, rhythm, or strength of your heartbeat.  You are taking a  blood-thinning medicine and you get more bruising.  You get tired more easily when you move or exercise.  You have a sudden change in weight. Get help right away if:   You have pain in your chest or your belly (abdomen).  You have trouble breathing.  You have side effects of blood thinners, such as blood in your vomit, poop (stool), or pee (urine), or bleeding that cannot stop.  You have any signs of a stroke. "BE FAST" is an easy way to remember the main warning signs: ? B - Balance. Signs are dizziness, sudden trouble walking, or loss of balance. ? E - Eyes. Signs are trouble seeing or a change in how you see. ? F - Face. Signs are sudden weakness or loss of feeling in the face, or the face or eyelid drooping on one side. ? A - Arms. Signs are weakness or loss of feeling in an arm. This happens suddenly and usually on one side of the body. ? S - Speech. Signs are sudden trouble speaking, slurred speech, or trouble understanding what people say. ? T - Time. Time to call emergency services. Write down what time symptoms started.  You have other signs of a stroke, such as: ? A sudden, very bad headache with no known cause. ? Feeling like you may vomit (nausea). ? Vomiting. ? A seizure. These symptoms may be an emergency. Do not wait to see if the symptoms will go away. Get medical help right away. Call your local emergency services (911 in the [place] Do not drive yourself to the hospital. Summary  Atrial fibrillation is a type of heartbeat that is irregular or fast.  You are at higher risk of this condition if you smoke, are older, have diabetes, or are overweight.  Follow your doctor's instructions about medicines, diet, exercise, and follow-up visits.  Get help right away if you have signs or symptoms of a stroke.  Get help right away if you cannot catch your breath, or you have chest pain or discomfort. This information is not intended to replace advice given to you by your  health care provider. Make sure you discuss any questions you have with your health care provider. Document Revised: 12/19/2018 Document Reviewed: 12/19/2018 Elsevier Patient Education  [employer].

## 2019-11-06 NOTE — Progress Notes (Signed)
Office Visit    Patient Name: Amber Barnes Date of Encounter: 11/06/2019  Primary Care Provider:  [name], FNP Primary Cardiologist:  Ida Rogue, MD Electrophysiologist:  None   Chief Complaint    Amber Barnes is a 69 y.o. female with a hx of PAF, obesity, arthritis, HLD, HTN. OSA on CPAP, migraines, anxiety  presents today for ED follow up.    Past Medical History    Past Medical History:  Diagnosis Date  . Achilles tendinitis   . Acute medial meniscal tear   . Allergy   . Anxiety   . Arthritis   . Atrial fibrillation ([place])    a. new onset 07/2016; b. CHADS2VASc --> 2 (HTN, female); c. eliquis  . CHF (congestive heart failure) ([place])   . Chronic lower back pain   . COPD (chronic obstructive pulmonary disease) ([place])   . Depression   . Fibrocystic breast disease   . GERD (gastroesophageal reflux disease)   . Hyperlipidemia   . Hypertension   . Insomnia   . Lipoma   . Migraine   . Osteoarthritis    left knee  . Osteopenia   . Sleep apnea    does not use her cpap  . Tendonitis   . Thyroid disease   . Vitamin D deficiency    Past Surgical History:  Procedure Laterality Date  . ABDOMINAL HYSTERECTOMY     ovaries left  . ACHILLES TENDON SURGERY     2012,01/2017  . BACK SURGERY    . CARDIOVERSION N/A 09/07/2016   Procedure: CARDIOVERSION;  Surgeon: Minna Merritts, MD;  Location: [facility] ORS;  Service: Cardiovascular;  Laterality: N/A;  . CARDIOVERSION N/A 10/19/2016   Procedure: CARDIOVERSION;  Surgeon: Minna Merritts, MD;  Location: [facility] OR[facility];  Service: Cardiovascular;  Laterality: N/A;  . CARDIOVERSION N/A 12/14/2016   Procedure: CARDIOVERSION;  Surgeon: Minna Merritts, MD;  Location: [facility];  Service: Cardiovascular;  Laterality: N/A;  . COLONOSCOPY    . ESOPHAGOGASTRODUODENOSCOPY    . THYROIDECTOMY     thyroid lobectomy secondary to a benign nodule  . TOTAL HIP ARTHROPLASTY Right 08/21/2018   Procedure: TOTAL HIP ARTHROPLASTY ANTERIOR  APPROACH-RIGHT CELL SAVER REQUESTED;  Surgeon: [name], MD;  Location: [facility] ORS;  Service: Orthopedics;  Laterality: Right;    Allergies  Allergies  Allergen Reactions  . Prednisone Other (See Comments)    Agitation, hyperactivity, polyphagia  . Hydrocodone-Acetaminophen Other (See Comments)    Hallucinations and combative  . Oxycodone Itching    Can take it with benadryl  . Tramadol Itching    Can take it with benadryl  . Adhesive [Tape] Itching and Rash    Prefers paper tape  . Latex Itching and Rash    use paper tap    History of Present Illness    Amber Barnes is a 69 y.o. female with a hx of PAF, obesity, arthritis, HLD, HTN. OSA on CPAP, migraines, anxiety. She was last seen 08/26/19.  Her PAF is s/p cardioversion 08/2016. Stress test January 2018 with no ischemia, LVEF 55-65%. When last seen in clinic noted increase stress due to family situations. EKG at that visit showed SB 59 bpm. BP mildly elevated at office visit and she was asked to monitor.   09/14/19 she reported SBP at home consistently 145. She was recommended to start HCTZ 12.5mg  daily. d  Seen in ED 10/30/19 with nausea, dizziness. She was treated with IVF and phenergan. EKG was not performed.  Seen  in the ED yesterday. She presented from her PCP with intractable nausea, chest pain with palpitations, and epigastric abdominal pain. Noted to be in atrial fibrillation.  Cardiac enzymes were negative x2. There was concern that her amiodarone was contributory and it was discontinued. ED provider discussed with Dr. Saunders Revel who recommended increasing Zebeta to 5mg  for rate control.   She left the ED about 8 PM last night.  Tells me she slept well.  Has had some nausea this morning but tells me it is not quite as bad.  Of note, she has finished her supply of Phenergan given at previous office visit a week ago.  She took all of her medications this morning on empty stomach which we discussed may be contributory to her  nausea.  Tells me her blood pressure has been high then lower in the afternoon. Has been "all over the place" at home.   We discussed that her recurrent atrial fibrillation might be contributory to some of her shortness of breath and fatigue.  She does not recall what her symptoms were with previous episode in 2018.  Tells me she might have missed doses of Eliquis in the last 3 weeks due to problems with nausea and vomiting.  We discussed the need for 3 weeks of uninterrupted anticoagulation prior to cardioversion.. HR at home when checked with BP cuff has been "all of a sudden" very labile.   Drinking fluids throughout the day - mostly gingerale.  We discussed eating small and regular meals to prevent nausea.  Also discussed peppermint ginger is helping reduce nausea.  She reports no chest pain, pressure, tightness.  She reports no orthopnea, PND, edema.  EKGs/Labs/Other Studies Reviewed:   The following studies were reviewed today:  Echo 07/2016 Left ventricle: The cavity size was normal. There was mild    concentric hypertrophy. Systolic function was normal. The    estimated ejection fraction was in the range of 55% to 60%. Wall    motion was normal; there were no regional wall motion    abnormalities. The study is not technically sufficient to allow    evaluation of LV diastolic function.  - Mitral valve: There was mild regurgitation.  - Left atrium: The atrium was mildly dilated.  - Right ventricle: Systolic function was normal.  - Pulmonary arteries: Systolic pressure could not be accurately    estimated.   EKG:  EKG is ordered today.  The ekg ordered today demonstrates atrial fibrillation 98 bpm with moderate voltage criteria for LVH and no acute ST/T wave changes. QT 388 and QTc 495.   Recent Labs: 12/05/2018: TSH 4.45 11/05/2019: ALT 35; B Natriuretic Peptide 162.0; BUN 12; Creatinine, Ser 0.85; Hemoglobin 15.7; Magnesium 1.8; Platelets 217; Potassium 3.6; Sodium 140  Recent  Lipid Panel    Component Value Date/Time   CHOL 182 11/12/2018 0907   TRIG 111.0 11/12/2018 0907   HDL 70.80 11/12/2018 0907   CHOLHDL 3 11/12/2018 0907   VLDL 22.2 11/12/2018 0907   LDLCALC 89 11/12/2018 0907    Home Medications   No outpatient medications have been marked as taking for the 11/06/19 encounter (Appointment) with Loel Dubonnet, NP.      Review of Systems      Review of Systems  Constitution: Positive for malaise/fatigue. Negative for chills and fever.  Cardiovascular: Positive for dyspnea on exertion and palpitations. Negative for chest pain, leg swelling, near-syncope, orthopnea and syncope.  Respiratory: Negative for cough, shortness of breath and wheezing.  Gastrointestinal: Positive for nausea and vomiting. Negative for melena.  Genitourinary: Negative for hematuria.  Neurological: Positive for weakness. Negative for dizziness and light-headedness.   All other systems reviewed and are otherwise negative except as noted above.  Physical Exam    VS:  There were no vitals taken for this visit. , BMI There is no height or weight on file to calculate BMI. GEN: Well nourished, overweigh, well developed, in no acute distress. HEENT: normal. Neck: Supple, no JVD, carotid bruits, or masses. Cardiac: irregularly irregular, no murmurs, rubs, or gallops. No clubbing, cyanosis, edema.  Radials/DP/PT 2+ and equal bilaterally.  Respiratory:  Respirations regular and unlabored, clear to auscultation bilaterally. GI: Soft, nontender, nondistended, BS + x 4. MS: No deformity or atrophy. Skin: Warm and dry, no rash. Neuro:  Strength and sensation are intact. Psych: Normal affect.   Assessment & Plan    1. PAF -s/p cardioversion 2018.  Recurrent atrial fibrillation noted in ED visit yesterday.  Anticipate much of her shortness of breath and fatigue are related to recurrent atrial fibrillation.  Bisoprolol increased during ED visit yesterday.  She was educated to  monitor heart rate at home and report heart rate routinely greater than 100 for possible further increased dose of bisoprolol.  2. On amiodarone therapy -on amiodarone since 2018 after cardioversion.  Recently developed symptoms of unexplained nausea.  Her amiodarone was discontinued today in the emergency department as it was potentially thought to be contributory.  She verbalizes understanding that this may take some time to be out of her system.  Her bisoprolol was increased for additional rate control, as above.  Her EKG today shows rate controlled atrial fibrillation.  We will plan for cardioversion in a few weeks if remains in atrial fibrillation.  May require additional antiarrhythmic agent in the future.   3. Chronic anticoagulation - Secondary to PAF and CHA2DS2-VASc of at least 3 (female, age, HTN). Continue Eliquis 5mg  BID.  Reports no bleeding complications.  Of note, she thinks he may have missed doses of her Eliquis in the last 3 weeks due to nausea and vomiting.  As such, will require 3-week period of uninterrupted anticoagulation prior to cardioversion.  This plan of care was discussed with her and agreed upon.  4. HTN -blood pressure low normal today but reports no lightheadedness, dizziness.  We will continue her present antihypertensive regimen including bisoprolol 5 mg daily, furosemide 20 mg daily, HCTZ 12.5 mg daily, losartan 100 milligrams daily  5. HLD - Lipid panel 11/2018 total cholesterol 182, HDL 70, LDL 89. Continue Crestor.   6. Anxiety -likely contributory to elevated heart rates and labile blood pressures.  Continue to follow with PCP.  7. Nausea - Unclear etiology.  CT abdomen in the ED with no acute findings.  Her amiodarone has been discontinued, as above.  She was provided with a refill of Phenergan with no additional refills and instructed that further refills should come from her PCP.  She was agreeable.  Disposition: Follow up in 3 week(s) with Dr. [name] or APP.      Loel Dubonnet, NP 11/06/2019, 7:57 AM

## 2019-11-11 ENCOUNTER — Encounter: Payer: Self-pay | Admitting: Family

## 2019-11-12 ENCOUNTER — Ambulatory Visit (INDEPENDENT_AMBULATORY_CARE_PROVIDER_SITE_OTHER): Payer: Medicare Other | Admitting: Cardiovascular Disease

## 2019-11-12 ENCOUNTER — Encounter: Payer: Self-pay | Admitting: Cardiovascular Disease

## 2019-11-12 ENCOUNTER — Other Ambulatory Visit: Payer: Self-pay

## 2019-11-12 VITALS — BP 132/88 | HR 59 | Ht 65.0 in | Wt 312.0 lb

## 2019-11-12 DIAGNOSIS — I4819 Other persistent atrial fibrillation: Secondary | ICD-10-CM

## 2019-11-12 DIAGNOSIS — I4891 Unspecified atrial fibrillation: Secondary | ICD-10-CM

## 2019-11-12 MED ORDER — BISOPROLOL FUMARATE 5 MG PO TABS: 5.0000 mg | ORAL_TABLET | ORAL | 3 refills | Status: DC

## 2019-11-12 NOTE — Patient Instructions (Signed)
Medication Instructions:  Please take extra bisoprolol in the evening as needed for fast heart rate   If you need a refill on your cardiac medications before your next appointment, please call your pharmacy.    Lab work: No new labs needed   If you have labs (blood work) drawn today and your tests are completely normal, you will receive your results only by: Marland Kitchen MyChart Message (if you have MyChart) OR . A paper copy in the mail If you have any lab test that is abnormal or we need to change your treatment, we will call you to review the results.   Testing/Procedures: No new testing needed   Follow-Up: At [facility], you and your health needs are our priority.  As part of our continuing mission to provide you with exceptional heart care, we have created designated Provider Care Teams.  These Care Teams include your primary Cardiologist (physician) and Advanced Practice Providers (APPs -  Physician Assistants and Nurse Practitioners) who all work together to provide you with the care you need, when you need it.  . You will need a follow up appointment in 6 months .  Marland Kitchen Providers on your designated Care Team:   . Murray Hodgkins, NP . Christell Faith, PA-C . Marrianne Mood, PA-C  Any Other Special Instructions Will Be Listed Below (If Applicable).  For educational health videos Log in to : [email or web address] Or : [email or web address], password : [email or web address]

## 2019-11-12 NOTE — Progress Notes (Signed)
Date:  11/12/2019   ID:  Gentry Mlakar, DOB 03/08/1951, MRN [id number]  Patient Location:  [place] [id number]   Provider location:   [facility], [place] office  PCP:  [name], FNP  Cardiologist:  Arvid Right [facility]  Chief Complaint  Patient presents with  . other    Pt. c/o elevated BP. Meds reviewed by the pt. verbally.     History of Present Illness:    Amber Barnes is a 69 y.o. female  past medical history of Paroxysmal atrial fibrillation, cardioversion 2018 Normal sinus rhythm did not hold on flecainide, changed to amiodarone  cardioversion, normal sinus rhythm obesity,  arthritis,  hyperlipidemia,  hypertension,  OSA , not on CPAP Migraines, anxiety Stress test January 2018 showing no ischemia, Ejection fraction 55-65% 3. CHA2DS2-VASc of at least 3 (female, age, HTN). who presents for follow-up of her hyperlipidemia, hypertension, persistent atrial fibrillation  Emergency room visit October 30, 2019 nausea, dizziness, treated with IV fluids Phenergan.   Continue to have nausea, chest pain palpitations, Seen in the emergency room November 05, 2019 for atrial fibrillation Cardiac enzymes negative Amiodarone was held Bisoprolol increased up to 5 daily Was in atrial fibrillation in clinic follow-up November 06, 2019  She has been tracking heart rate at home rate typically 90-110 Today heart rate in the 60s  EKG today showing normal sinus rhythm rate 59 bpm no significant ST-T wave changes  Stress: lost a few friends covid Husband with MS Granddaughter living with them 70 yrs old     Cholesterol at goal, numbers reviewed with her in detail  Other past medical history reviewed cardioversion procedure 09/07/2016 Normal sinus rhythm restored at that time Back into atrial fibrillation on last hospital visit March 13  Flecainide held, on amiodarone Repeat cardioversion 10/19/2016 Normal sinus  rhythm restored  Recurrent atrial fib Seen by Dr. [name], Started on ranexa and continued on amiodarone She converted to normal sinus rhythm on her own, did not need DCCV  In the hospital for foot surgery, this was delayed as she was found to be in atrial fibrillation Admitted to the hospital 07/27/2016 Started on diltiazem, beta blocker, anticoagulation  Back in hospsital 08/10/2016 Hospital records reviewed She wasvery anxious about palpitations Developed chest discomfort, tightness Medication doses were increased for better rate control Lasix in the emergency room for shortness of breath Also given DuoNeb's  Prior CV studies:   The following studies were reviewed today:    Past Medical History:  Diagnosis Date  . Achilles tendinitis   . Acute medial meniscal tear   . Allergy   . Anxiety   . Arthritis   . Atrial fibrillation ([place])    a. new onset 07/2016; b. CHADS2VASc --> 2 (HTN, female); c. eliquis  . CHF (congestive heart failure) ([place])   . Chronic lower back pain   . COPD (chronic obstructive pulmonary disease) ([place])   . Depression   . Fibrocystic breast disease   . GERD (gastroesophageal reflux disease)   . Hyperlipidemia   . Hypertension   . Insomnia   . Lipoma   . Migraine   . Osteoarthritis    left knee  . Osteopenia   . Sleep apnea    does not use her cpap  . Tendonitis   . Thyroid disease   . Vitamin D deficiency    Past Surgical History:  Procedure Laterality Date  . ABDOMINAL HYSTERECTOMY     ovaries  left  . ACHILLES TENDON SURGERY     2012,01/2017  . BACK SURGERY    . CARDIOVERSION N/A 09/07/2016   Procedure: CARDIOVERSION;  Surgeon: Minna Merritts, MD;  Location: [facility] ORS;  Service: Cardiovascular;  Laterality: N/A;  . CARDIOVERSION N/A 10/19/2016   Procedure: CARDIOVERSION;  Surgeon: Minna Merritts, MD;  Location: [facility] ORS;  Service: Cardiovascular;  Laterality: N/A;  . CARDIOVERSION N/A 12/14/2016   Procedure: CARDIOVERSION;  Surgeon:  Minna Merritts, MD;  Location: [facility] ORS;  Service: Cardiovascular;  Laterality: N/A;  . COLONOSCOPY    . ESOPHAGOGASTRODUODENOSCOPY    . THYROIDECTOMY     thyroid lobectomy secondary to a benign nodule  . TOTAL HIP ARTHROPLASTY Right 08/21/2018   Procedure: TOTAL HIP ARTHROPLASTY ANTERIOR APPROACH-RIGHT CELL SAVER REQUESTED;  Surgeon: [name], MD;  Location: [facility] ORS;  Service: Orthopedics;  Laterality: Right;     Current Meds  Medication Sig  . acetaminophen (TYLENOL) 500 MG tablet Take 500 mg by mouth every 6 (six) hours as needed. Taking as needed  . albuterol (VENTOLIN HFA) 108 (90 Base) MCG/ACT inhaler INHALE 2 PUFFS INTO THE LUNGS EVERY 8 (EIGHT) HOURS AS NEEDED FOR WHEEZING OR SHORTNESS OF BREATH.  Marland Kitchen ALPRAZolam (XANAX) 0.25 MG tablet TAKE 1 TABLET BY MOUTH DAILY AS NEEDED FOR ANXIETY OR SLEEP.  . B Complex Vitamins (VITAMIN B COMPLEX PO) Take 1 tablet by mouth daily.   . bisoprolol (ZEBETA) 5 MG tablet Take 1 tablet (5 mg total) by mouth daily.  . calcium gluconate 500 MG tablet Take 500 tablets by mouth daily.  . Cholecalciferol (VITAMIN D3) 5000 units CAPS Take 5,000 Units by mouth daily.  Marland Kitchen ELIQUIS 5 MG TABS tablet TAKE 1 TABLET BY MOUTH TWICE A DAY  . esomeprazole (NEXIUM) 20 MG capsule Take 20 mg by mouth daily as needed (acid reflux).  . furosemide (LASIX) 20 MG tablet TAKE 1 TABLET (20 MG TOTAL) BY MOUTH 2 (TWO) TIMES DAILY.  . hydrochlorothiazide (MICROZIDE) 12.5 MG capsule Take 1 capsule (12.5 mg total) by mouth daily.  Marland Kitchen losartan (COZAAR) 100 MG tablet TAKE 1 TABLET BY MOUTH EVERY DAY  . Multiple Vitamin (MULTIVITAMIN) tablet Take 1 tablet by mouth daily.  Marland Kitchen oxymetazoline (AFRIN) 0.05 % nasal spray Place 1 spray into both nostrils at bedtime as needed for congestion.   . potassium chloride (K-DUR) 10 MEQ tablet TAKE 1 TABLET BY MOUTH TWICE A DAY  . promethazine (PHENERGAN) 12.5 MG tablet Take 1 tablet (12.5 mg total) by mouth every 6 (six) hours as needed for nausea  or vomiting.  . ranolazine (RANEXA) 500 MG 12 hr tablet TAKE 1 TABLET BY MOUTH TWICE A DAY  . rizatriptan (MAXALT) 10 MG tablet TAKE 1 TABLET (10 MG TOTAL) BY MOUTH AS NEEDED FOR MIGRAINE. MAY REPEAT IN 2 HOURS IF NEEDED  . rOPINIRole (REQUIP) 0.25 MG tablet Take 1 tablet (0.25 mg total) by mouth every evening.  . rosuvastatin (CRESTOR) 20 MG tablet TAKE 1 TABLET BY MOUTH EVERY DAY  . sertraline (ZOLOFT) 100 MG tablet TAKE 1.5 TABLETS BY MOUTH ONCE DAILY     Allergies:   Prednisone, Hydrocodone-acetaminophen, Oxycodone, Tramadol, Adhesive [tape], and Latex   Social History   Tobacco Use  . Smoking status: Former Smoker    Packs/day: 0.25    Years: 10.00    Pack years: 2.50    Types: Cigarettes    Quit date: 2005    Years since quitting: 16.3  . Smokeless tobacco: Never Used  .  Tobacco comment: smoked for 10 years, 1 pack every 2-3 days  Substance Use Topics  . Alcohol use: Yes    Comment: social, occasional  . Drug use: No     Current Outpatient Medications on File Prior to Visit  Medication Sig Dispense Refill  . acetaminophen (TYLENOL) 500 MG tablet Take 500 mg by mouth every 6 (six) hours as needed. Taking as needed    . albuterol (VENTOLIN HFA) 108 (90 Base) MCG/ACT inhaler INHALE 2 PUFFS INTO THE LUNGS EVERY 8 (EIGHT) HOURS AS NEEDED FOR WHEEZING OR SHORTNESS OF BREATH. 6.7 g 0  . ALPRAZolam (XANAX) 0.25 MG tablet TAKE 1 TABLET BY MOUTH DAILY AS NEEDED FOR ANXIETY OR SLEEP. 30 tablet 1  . B Complex Vitamins (VITAMIN B COMPLEX PO) Take 1 tablet by mouth daily.     . bisoprolol (ZEBETA) 5 MG tablet Take 1 tablet (5 mg total) by mouth daily. 90 tablet 1  . calcium gluconate 500 MG tablet Take 500 tablets by mouth daily.    . Cholecalciferol (VITAMIN D3) 5000 units CAPS Take 5,000 Units by mouth daily.    Marland Kitchen ELIQUIS 5 MG TABS tablet TAKE 1 TABLET BY MOUTH TWICE A DAY 60 tablet 3  . esomeprazole (NEXIUM) 20 MG capsule Take 20 mg by mouth daily as needed (acid reflux).    .  furosemide (LASIX) 20 MG tablet TAKE 1 TABLET (20 MG TOTAL) BY MOUTH 2 (TWO) TIMES DAILY. 180 tablet 3  . hydrochlorothiazide (MICROZIDE) 12.5 MG capsule Take 1 capsule (12.5 mg total) by mouth daily. 30 capsule 2  . losartan (COZAAR) 100 MG tablet TAKE 1 TABLET BY MOUTH EVERY DAY 90 tablet 3  . Multiple Vitamin (MULTIVITAMIN) tablet Take 1 tablet by mouth daily.    Marland Kitchen oxymetazoline (AFRIN) 0.05 % nasal spray Place 1 spray into both nostrils at bedtime as needed for congestion.     . potassium chloride (K-DUR) 10 MEQ tablet TAKE 1 TABLET BY MOUTH TWICE A DAY 180 tablet 1  . promethazine (PHENERGAN) 12.5 MG tablet Take 1 tablet (12.5 mg total) by mouth every 6 (six) hours as needed for nausea or vomiting. 15 tablet 0  . ranolazine (RANEXA) 500 MG 12 hr tablet TAKE 1 TABLET BY MOUTH TWICE A DAY 180 tablet 0  . rizatriptan (MAXALT) 10 MG tablet TAKE 1 TABLET (10 MG TOTAL) BY MOUTH AS NEEDED FOR MIGRAINE. MAY REPEAT IN 2 HOURS IF NEEDED 10 tablet 0  . rOPINIRole (REQUIP) 0.25 MG tablet Take 1 tablet (0.25 mg total) by mouth every evening. 90 tablet 1  . rosuvastatin (CRESTOR) 20 MG tablet TAKE 1 TABLET BY MOUTH EVERY DAY 90 tablet 0  . sertraline (ZOLOFT) 100 MG tablet TAKE 1.5 TABLETS BY MOUTH ONCE DAILY 135 tablet 2   No current facility-administered medications on file prior to visit.     Family Hx: The patient's family history includes Breast cancer in her paternal aunt; Cancer in her mother; Cancer (age of onset: 60) in her sister; Cancer (age of onset: 72) in her father; Depression in her sister; Heart attack (age of onset: 74) in her father; Heart disease in her father; Hyperlipidemia in her father; Hypertension in her father. There is no history of Migraines.  ROS:   Please see the history of present illness.    Review of Systems  Constitutional: Negative.   Respiratory: Positive for shortness of breath.   Cardiovascular: Negative.   Gastrointestinal: Negative.   Neurological: Negative.    Psychiatric/Behavioral: Negative.  All other systems reviewed and are negative.    Labs/Other Tests and Data Reviewed:    Recent Labs: 12/05/2018: TSH 4.45 11/05/2019: ALT 35; B Natriuretic Peptide 162.0; BUN 12; Creatinine, Ser 0.85; Hemoglobin 15.7; Magnesium 1.8; Platelets 217; Potassium 3.6; Sodium 140   Recent Lipid Panel Lab Results  Component Value Date/Time   CHOL 182 11/12/2018 09:07 AM   TRIG 111.0 11/12/2018 09:07 AM   HDL 70.80 11/12/2018 09:07 AM   CHOLHDL 3 11/12/2018 09:07 AM   LDLCALC 89 11/12/2018 09:07 AM    Wt Readings from Last 3 Encounters:  11/12/19 (!) 312 lb (141.5 kg)  11/06/19 (!) 311 lb (141.1 kg)  11/05/19 (!) 311 lb 6.4 oz (141.3 kg)     Exam:    Vital Signs: Vital signs may also be detailed in the HPI BP 132/88 (BP Location: Left Arm, Patient Position: Sitting, Cuff Size: Large)   Pulse (!) 59   Ht 5\' 5"  (1.651 m)   Wt (!) 312 lb (141.5 kg)   SpO2 96%   BMI 51.92 kg/m   Constitutional:  oriented to person, place, and time. No distress.  HENT:  Head: Grossly normal Eyes:  no discharge. No scleral icterus.  Neck: No JVD, no carotid bruits  Cardiovascular: Regular rate and rhythm, no murmurs appreciated Pulmonary/Chest: Clear to auscultation bilaterally, no wheezes or rails Abdominal: Soft.  no distension.  no tenderness.  Musculoskeletal: Normal range of motion Neurological:  normal muscle tone. Coordination normal. No atrophy Skin: Skin warm and dry Psychiatric: normal affect, pleasant   ASSESSMENT & PLAN:    Syncope No recent episodes, no further work-up  Anxiety Significant stressors at home Discussed in detail  Mixed hyperlipidemia Cholesterol is at goal on the current lipid regimen. No changes to the medications were made. Stable  Essential hypertension Blood pressure is well controlled on today's visit. No changes made to the medications.  Atrial fibrillation with RVR (HCC) Back in normal sinus rhythm Nausea  improved She has held amiodarone Previous attempt with flecainide 2018 she did not hold normal sinus rhythm On bisoprolol 5 daily Recommend extra bisoprolol as needed for breakthrough atrial fibrillation  SOB (shortness of breath) As she recovers, recommend walking program for conditioning  Morbid obesity ([place]) We have encouraged continued exercise, careful diet management in an effort to lose weight.   Total encounter time more than 25 minutes  Greater than 50% was spent in counseling and coordination of care with the patient    Disposition: Follow-up in 6 months   Signed, Ida Rogue, MD  11/12/2019 5:25 PM    [place] [facility] [id number], [place], [place] [address]

## 2019-11-13 ENCOUNTER — Other Ambulatory Visit: Payer: Self-pay

## 2019-11-13 DIAGNOSIS — R112 Nausea with vomiting, unspecified: Secondary | ICD-10-CM | POA: Diagnosis not present

## 2019-11-13 DIAGNOSIS — I10 Essential (primary) hypertension: Secondary | ICD-10-CM | POA: Insufficient documentation

## 2019-11-13 DIAGNOSIS — Z87891 Personal history of nicotine dependence: Secondary | ICD-10-CM | POA: Diagnosis not present

## 2019-11-13 DIAGNOSIS — R1011 Right upper quadrant pain: Secondary | ICD-10-CM | POA: Insufficient documentation

## 2019-11-13 DIAGNOSIS — J449 Chronic obstructive pulmonary disease, unspecified: Secondary | ICD-10-CM | POA: Diagnosis not present

## 2019-11-13 DIAGNOSIS — Z79899 Other long term (current) drug therapy: Secondary | ICD-10-CM | POA: Diagnosis not present

## 2019-11-13 DIAGNOSIS — Z96641 Presence of right artificial hip joint: Secondary | ICD-10-CM | POA: Diagnosis not present

## 2019-11-13 DIAGNOSIS — Z91040 Latex allergy status: Secondary | ICD-10-CM | POA: Diagnosis not present

## 2019-11-13 DIAGNOSIS — K760 Fatty (change of) liver, not elsewhere classified: Secondary | ICD-10-CM | POA: Diagnosis not present

## 2019-11-13 LAB — CBC
HCT: 44.8 % (ref 36.0–46.0)
Hemoglobin: 14.7 g/dL (ref 12.0–15.0)
MCH: 29.4 pg (ref 26.0–34.0)
MCHC: 32.8 g/dL (ref 30.0–36.0)
MCV: 89.6 fL (ref 80.0–100.0)
Platelets: 175 10*3/uL (ref 150–400)
RBC: 5 MIL/uL (ref 3.87–5.11)
RDW: 14.7 % (ref 11.5–15.5)
WBC: 7.7 10*3/uL (ref 4.0–10.5)
nRBC: 0 % (ref 0.0–0.2)

## 2019-11-13 LAB — COMPREHENSIVE METABOLIC PANEL
ALT: 34 U/L (ref 0–44)
AST: 32 U/L (ref 15–41)
Albumin: 4 g/dL (ref 3.5–5.0)
Alkaline Phosphatase: 88 U/L (ref 38–126)
Anion gap: 11 (ref 5–15)
BUN: 18 mg/dL (ref 8–23)
CO2: 25 mmol/L (ref 22–32)
Calcium: 8.5 mg/dL — ABNORMAL LOW (ref 8.9–10.3)
Chloride: 102 mmol/L (ref 98–111)
Creatinine, Ser: 0.87 mg/dL (ref 0.44–1.00)
GFR calc Af Amer: >60 mL/min (ref >60)
GFR calc non Af Amer: >60 mL/min (ref >60)
Glucose, Bld: 113 mg/dL — ABNORMAL HIGH (ref 70–99)
Potassium: 3.7 mmol/L (ref 3.5–5.1)
Sodium: 138 mmol/L (ref 135–145)
Total Bilirubin: 1 mg/dL (ref 0.3–1.2)
Total Protein: 7.5 g/dL (ref 6.5–8.1)

## 2019-11-13 LAB — LIPASE, BLOOD: Lipase: 24 U/L (ref 11–51)

## 2019-11-13 LAB — TROPONIN I (HIGH SENSITIVITY): Troponin I (High Sensitivity): 3 ng/L (ref <18)

## 2019-11-13 NOTE — ED Triage Notes (Signed)
Pt in with left arm pain that started today. Denies any injury, states has been vomiting for 5 weeks and has been here several times for the same. States has not been give a cause, does have apptm with GI in June but states left arm pain is new.

## 2019-11-14 ENCOUNTER — Emergency Department: Payer: Medicare Other

## 2019-11-14 ENCOUNTER — Emergency Department
Admission: EM | Admit: 2019-11-14 | Discharge: 2019-11-14 | Disposition: A | Discharge status: Home / Self Care | Payer: Medicare Other | Attending: Emergency Medicine | Admitting: Emergency Medicine

## 2019-11-14 ENCOUNTER — Encounter: Payer: Self-pay | Admitting: Family

## 2019-11-14 DIAGNOSIS — K760 Fatty (change of) liver, not elsewhere classified: Secondary | ICD-10-CM | POA: Diagnosis not present

## 2019-11-14 DIAGNOSIS — R109 Unspecified abdominal pain: Secondary | ICD-10-CM

## 2019-11-14 DIAGNOSIS — R1011 Right upper quadrant pain: Secondary | ICD-10-CM | POA: Diagnosis not present

## 2019-11-14 LAB — TROPONIN I (HIGH SENSITIVITY): Troponin I (High Sensitivity): 3 ng/L (ref <18)

## 2019-11-14 MED ORDER — SODIUM CHLORIDE 0.9 % IV BOLUS
1000.0000 mL | Freq: Once | INTRAVENOUS | Status: AC
  Administered 2019-11-14: 1000 mL via INTRAVENOUS

## 2019-11-14 MED ORDER — PANTOPRAZOLE SODIUM 40 MG IV SOLR
40.0000 mg | Freq: Once | INTRAVENOUS | Status: AC
  Administered 2019-11-14: 06:00:00 40 mg via INTRAVENOUS
  Filled 2019-11-14: qty 40

## 2019-11-14 MED ORDER — PANTOPRAZOLE SODIUM 40 MG PO TBEC
40.0000 mg | DELAYED_RELEASE_TABLET | Freq: Two times a day (BID) | ORAL | 1 refills | Status: DC
Start: 2019-11-14 — End: 2020-03-20

## 2019-11-14 MED ORDER — ONDANSETRON HCL 4 MG/2ML IJ SOLN
4.0000 mg | Freq: Once | INTRAMUSCULAR | Status: AC
  Administered 2019-11-14: 06:00:00 4 mg via INTRAVENOUS
  Filled 2019-11-14: qty 2

## 2019-11-14 MED ORDER — POTASSIUM CHLORIDE CRYS ER 20 MEQ PO TBCR
40.0000 meq | EXTENDED_RELEASE_TABLET | Freq: Once | ORAL | Status: AC
  Administered 2019-11-14: 40 meq via ORAL
  Filled 2019-11-14: qty 2

## 2019-11-14 MED ORDER — ONDANSETRON 4 MG PO TBDP: 4.0000 mg | ORAL_TABLET | Freq: Three times a day (TID) | ORAL | 0 refills | Status: DC | PRN

## 2019-11-14 NOTE — Telephone Encounter (Signed)
Pt called to check on the status of info below told patient that [name] is out of the office today and will be back tomorrow.  Patient also has an appt tomorrow with [name]

## 2019-11-14 NOTE — ED Provider Notes (Addendum)
[facility] Emergency Department Provider Note  ____________________________________________   First MD Initiated Contact with Patient 11/14/19 [phone]     (approximate)  I have reviewed the triage vital signs and the nursing notes.   HISTORY  Chief Complaint Arm Pain   HPI Amber Barnes is a 69 y.o. female with below list of previous medical conditions including 5-week history of upper abdominal discomfort with vomiting which patient has been seen in the emergency department with planned appointment with gastroenterology in June presents to the emergency department secondary to continued vomiting and acute onset of left arm pain which patient stated started today and is since resolved.  Patient states however that her nausea and vomiting has persisted.  Patient denies any chest pain no shortness of breath.  Patient denies any palpitations.  Patient denies any fever no diarrhea or constipation.        Past Medical History:  Diagnosis Date  . Achilles tendinitis   . Acute medial meniscal tear   . Allergy   . Anxiety   . Arthritis   . Atrial fibrillation ([place])    a. new onset 07/2016; b. CHADS2VASc --> 2 (HTN, female); c. eliquis  . CHF (congestive heart failure) ([place])   . Chronic lower back pain   . COPD (chronic obstructive pulmonary disease) ([place])   . Depression   . Fibrocystic breast disease   . GERD (gastroesophageal reflux disease)   . Hyperlipidemia   . Hypertension   . Insomnia   . Lipoma   . Migraine   . Osteoarthritis    left knee  . Osteopenia   . Sleep apnea    does not use her cpap  . Tendonitis   . Thyroid disease   . Vitamin D deficiency     Patient Active Problem List   Diagnosis Date Noted  . Nausea 10/30/2019  . Major depression in remission ([place]) 08/27/2018  . Primary osteoarthritis involving multiple joints 08/27/2018  . Atrial fibrillation ([place]) 08/27/2018  . COPD (chronic obstructive pulmonary disease) ([place])  08/27/2018  . BMI 50.0-59.9, adult ([place]) 07/16/2018  . Morbid obesity with BMI of 50.0-59.9, adult ([place]) 05/31/2018  . Bradycardia 05/22/2017  . OSA (obstructive sleep apnea) 01/09/2017  . Hypertension 11/11/2014    Past Surgical History:  Procedure Laterality Date  . ABDOMINAL HYSTERECTOMY     ovaries left  . ACHILLES TENDON SURGERY     2012,01/2017  . BACK SURGERY    . CARDIOVERSION N/A 09/07/2016   Procedure: CARDIOVERSION;  Surgeon: Minna Merritts, MD;  Location: [facility] ORS;  Service: Cardiovascular;  Laterality: N/A;  . CARDIOVERSION N/A 10/19/2016   Procedure: CARDIOVERSION;  Surgeon: Minna Merritts, MD;  Location: [facility] ORS;  Service: Cardiovascular;  Laterality: N/A;  . CARDIOVERSION N/A 12/14/2016   Procedure: CARDIOVERSION;  Surgeon: Minna Merritts, MD;  Location: [facility] ORS;  Service: Cardiovascular;  Laterality: N/A;  . COLONOSCOPY    . ESOPHAGOGASTRODUODENOSCOPY    . THYROIDECTOMY     thyroid lobectomy secondary to a benign nodule  . TOTAL HIP ARTHROPLASTY Right 08/21/2018   Procedure: TOTAL HIP ARTHROPLASTY ANTERIOR APPROACH-RIGHT CELL SAVER REQUESTED;  Surgeon: [name], MD;  Location: [facility] ORS;  Service: Orthopedics;  Laterality: Right;    Prior to Admission medications   Medication Sig Start Date End Date Taking? Authorizing Provider  acetaminophen (TYLENOL) 500 MG tablet Take 500 mg by mouth every 6 (six) hours as needed. Taking as needed    [provider]  albuterol (  VENTOLIN HFA) 108 (90 Base) MCG/ACT inhaler INHALE 2 PUFFS INTO THE LUNGS EVERY 8 (EIGHT) HOURS AS NEEDED FOR WHEEZING OR SHORTNESS OF BREATH. 04/04/19   [name], FNP  ALPRAZolam Duanne Moron) 0.25 MG tablet TAKE 1 TABLET BY MOUTH DAILY AS NEEDED FOR ANXIETY OR SLEEP. 07/31/19   [name], FNP  B Complex Vitamins (VITAMIN B COMPLEX PO) Take 1 tablet by mouth daily.     [provider]  bisoprolol (ZEBETA) 5 MG tablet Take 1 tablet (5 mg total) by mouth as directed.  Take one tablet in the AM and may take extra pill in PM if needed for heart rate 11/12/19   Gollan, [name], MD  calcium gluconate 500 MG tablet Take 500 tablets by mouth daily.    [provider]  Cholecalciferol (VITAMIN D3) 5000 units CAPS Take 5,000 Units by mouth daily.    [provider]  ELIQUIS 5 MG TABS tablet TAKE 1 TABLET BY MOUTH TWICE A DAY 09/09/19   Minna Merritts, MD  esomeprazole (NEXIUM) 20 MG capsule Take 20 mg by mouth daily as needed (acid reflux).    [provider]  furosemide (LASIX) 20 MG tablet TAKE 1 TABLET (20 MG TOTAL) BY MOUTH 2 (TWO) TIMES DAILY. 10/04/18   Minna Merritts, MD  hydrochlorothiazide (MICROZIDE) 12.5 MG capsule Take 1 capsule (12.5 mg total) by mouth daily. 09/16/19 12/15/19  Loel Dubonnet, NP  losartan (COZAAR) 100 MG tablet TAKE 1 TABLET BY MOUTH EVERY DAY 08/29/19   Minna Merritts, MD  Multiple Vitamin (MULTIVITAMIN) tablet Take 1 tablet by mouth daily.    [provider]  ondansetron (ZOFRAN ODT) 4 MG disintegrating tablet Take 1 tablet (4 mg total) by mouth every 8 (eight) hours as needed. 11/14/19   Gregor Hams, MD  oxymetazoline (AFRIN) 0.05 % nasal spray Place 1 spray into both nostrils at bedtime as needed for congestion.     [provider]  pantoprazole (PROTONIX) 40 MG tablet Take 1 tablet (40 mg total) by mouth 2 (two) times daily. 11/14/19 12/14/19  Gregor Hams, MD  potassium chloride (K-DUR) 10 MEQ tablet TAKE 1 TABLET BY MOUTH TWICE A DAY 01/10/19   Minna Merritts, MD  promethazine (PHENERGAN) 12.5 MG tablet Take 1 tablet (12.5 mg total) by mouth every 6 (six) hours as needed for nausea or vomiting. 11/06/19   Loel Dubonnet, NP  ranolazine (RANEXA) 500 MG 12 hr tablet TAKE 1 TABLET BY MOUTH TWICE A DAY 08/26/19   Gollan, [name], MD  rizatriptan (MAXALT) 10 MG tablet TAKE 1 TABLET (10 MG TOTAL) BY MOUTH AS NEEDED FOR MIGRAINE. MAY REPEAT IN 2 HOURS IF NEEDED 03/13/18   Melvenia Beam, MD  rOPINIRole (REQUIP) 0.25 MG tablet Take 1 tablet (0.25 mg total) by mouth every evening. 10/18/19   [name], FNP  rosuvastatin (CRESTOR) 20 MG tablet TAKE 1 TABLET BY MOUTH EVERY DAY 08/26/19   [name], FNP  sertraline (ZOLOFT) 100 MG tablet TAKE 1.5 TABLETS BY MOUTH ONCE DAILY 05/10/19   [name], FNP    Allergies Prednisone, Hydrocodone-acetaminophen, Oxycodone, Tramadol, Adhesive [tape], and Latex  Family History  Problem Relation Age of Onset  . Cancer Sister 49       ovarian  . Depression Sister   . Cancer Father 91       colon  . Heart disease Father   . Heart attack Father 44  died from MI  . Hyperlipidemia Father   . Hypertension Father   . Cancer Mother        leukemia  . Breast cancer Paternal Aunt        34's  . Migraines Neg Hx     Social History Social History   Tobacco Use  . Smoking status: Former Smoker    Packs/day: 0.25    Years: 10.00    Pack years: 2.50    Types: Cigarettes    Quit date: 2005    Years since quitting: 16.3  . Smokeless tobacco: Never Used  . Tobacco comment: smoked for 10 years, 1 pack every 2-3 days  Substance Use Topics  . Alcohol use: Yes    Comment: social, occasional  . Drug use: No    Review of Systems Constitutional: No fever/chills Eyes: No visual changes. ENT: No sore throat. Cardiovascular: Denies chest pain. Respiratory: Denies shortness of breath. Gastrointestinal: Positive for abdominal pain nausea and vomiting..  No diarrhea.  No constipation. Genitourinary: Negative for dysuria. Musculoskeletal: Negative for neck pain.  Negative for back pain. Integumentary: Negative for rash. Neurological: Negative for headaches, focal weakness or numbness.  ____________________________________________   PHYSICAL EXAM:  VITAL SIGNS: ED Triage Vitals  Enc Vitals Group     BP 11/13/19 2220 (!) 146/80     Pulse Rate 11/13/19 2220 70     Resp 11/13/19 2220 20     Temp 11/13/19  2220 98.3 F (36.8 C)     Temp Source 11/13/19 2220 Oral     SpO2 11/13/19 2220 95 %     Weight 11/13/19 2221 (!) 140.6 kg (310 lb)     Height 11/13/19 2221 1.651 m (5\' 5" )     Head Circumference --      Peak Flow --      Pain Score 11/13/19 2226 8     Pain Loc --      Pain Edu? --      Excl. in [place]? --     Constitutional: Alert and oriented.  Eyes: Conjunctivae are normal.  Mouth/Throat: Patient is wearing a mask. Neck: No stridor.  No meningeal signs.   Cardiovascular: Normal rate, regular rhythm. Good peripheral circulation. Grossly normal heart sounds. Respiratory: Normal respiratory effort.  No retractions. Gastrointestinal: Soft and nontender. No distention.  Musculoskeletal: No lower extremity tenderness nor edema. No gross deformities of extremities. Neurologic:  Normal speech and language. No gross focal neurologic deficits are appreciated.  Skin:  Skin is warm, dry and intact. Psychiatric: Mood and affect are normal. Speech and behavior are normal.  ____________________________________________   LABS (all labs ordered are listed, but only abnormal results are displayed)  Labs Reviewed  COMPREHENSIVE METABOLIC PANEL - Abnormal; Notable for the following components:      Result Value   Glucose, Bld 113 (*)    Calcium 8.5 (*)    All other components within normal limits  CBC  LIPASE, BLOOD  TROPONIN I (HIGH SENSITIVITY)  TROPONIN I (HIGH SENSITIVITY)   ____________________________________________  EKG  ED ECG REPORT I, [place] N Hanley Woerner, the attending physician, personally viewed and interpreted this ECG.   Date: 11/13/2019  EKG Time: 10:18 PM  Rate: 71  Rhythm: Normal sinus rhythm  Axis: Normal  Intervals: Normal  ST&T Change: None  ____________________________________________  RADIOLOGY I, [place] N Harlan Ervine, personally viewed and evaluated these images (plain radiographs) as part of my medical decision making, as well as reviewing the written report  by the  radiologist.  ED MD interpretation: No acute findings noted on right upper quadrant ultrasound per radiologist.  Official radiology report(s): US ABDOMEN LIMITED RUQ  Result Date: 11/14/2019 CLINICAL DATA:  Right upper quadrant pain EXAM: ULTRASOUND ABDOMEN LIMITED RIGHT UPPER QUADRANT COMPARISON:  None. FINDINGS: Gallbladder: No gallstones or wall thickening visualized. No sonographic Murphy sign noted by sonographer. Common bile duct: Diameter: Normal caliber, 5 mm Liver: Increased echotexture compatible with fatty infiltration. No focal abnormality or biliary ductal dilatation. Portal vein is patent on color Doppler imaging with normal direction of blood flow towards the liver. Other: None. IMPRESSION: Fatty infiltration of the liver.  No acute findings. Electronically Signed   By: Rolm Baptise M.D.   On: 11/14/2019 07:22     Procedures   ____________________________________________   INITIAL IMPRESSION / MDM / ASSESSMENT AND PLAN / ED COURSE  As part of my medical decision making, I reviewed the following data within the [place] NUMBER  69 year old female presented with above-stated history and physical exam a differential diagnosis including but not limited to gastric ulcer versus other potential gastric pathology cholelithiasis choledocholithiasis, also considered ACS.  EKG revealed no evidence of ischemia or infarction laboratory data including high-sensitivity troponin x2 -.  Right upper quadrant ultrasound was performed which revealed no acute pathology.  Patient given Zofran 4 mg IV in the emergency department without any episodes of vomiting during my care.  Patient be prescribed Protonix for home with recommendation to follow-up with gastroenterology today to request a earlier appointment given severity of illness and multiple ED visits as a result  ____________________________________________  FINAL CLINICAL IMPRESSION(S) / ED DIAGNOSES  Final diagnoses:    RUQ pain  Abdominal pain, unspecified abdominal location     MEDICATIONS GIVEN DURING THIS VISIT:  Medications  sodium chloride 0.9 % bolus 1,000 mL (0 mLs Intravenous Stopped 11/14/19 0744)  ondansetron (ZOFRAN) injection 4 mg (4 mg Intravenous Given 11/14/19 0609)  pantoprazole (PROTONIX) injection 40 mg (40 mg Intravenous Given 11/14/19 0610)  potassium chloride SA (KLOR-CON) CR tablet 40 mEq (40 mEq Oral Given 11/14/19 0639)     ED Discharge Orders         Ordered    pantoprazole (PROTONIX) 40 MG tablet  2 times daily     11/14/19 0716    ondansetron (ZOFRAN ODT) 4 MG disintegrating tablet  Every 8 hours PRN     11/14/19 0716          *Please note:  Amber Barnes was evaluated in Emergency Department on 11/14/2019 for the symptoms described in the history of present illness. She was evaluated in the context of the global COVID-19 pandemic, which necessitated consideration that the patient might be at risk for infection with the SARS-CoV-2 virus that causes COVID-19. Institutional protocols and algorithms that pertain to the evaluation of patients at risk for COVID-19 are in a state of rapid change based on information released by regulatory bodies including the CDC and federal and state organizations. These policies and algorithms were followed during the patient's care in the ED.  Some ED evaluations and interventions may be delayed as a result of limited staffing during the pandemic.*  Note:  This document was prepared using Dragon voice recognition software and may include unintentional dictation errors.   Gregor Hams, MD 11/14/19 [id number]    Gregor Hams, MD 11/14/19 [phone]

## 2019-11-14 NOTE — ED Notes (Signed)
Pt states she has had vomiting for approx 5 weeks, has seen multiple doctors for the same including this ED without resolution. Has apptm with GI in June but yesterday started having left arm pain. States had slight chest pain, none at present.

## 2019-11-14 NOTE — ED Notes (Signed)
Pt alert and oriented X 4, stable for discharge. RR even and unlabored, color WNL. Discussed discharge instructions and follow up when appropriate. Instructed to follow up with ER for any life threatening symptoms or concerns that patient or family of patient may have  

## 2019-11-15 ENCOUNTER — Telehealth: Payer: Self-pay | Admitting: Family

## 2019-11-15 ENCOUNTER — Encounter: Payer: Self-pay | Admitting: Family

## 2019-11-15 ENCOUNTER — Telehealth (INDEPENDENT_AMBULATORY_CARE_PROVIDER_SITE_OTHER): Payer: Medicare Other | Admitting: Family

## 2019-11-15 VITALS — Ht 65.0 in | Wt 309.0 lb

## 2019-11-15 DIAGNOSIS — G2581 Restless legs syndrome: Secondary | ICD-10-CM | POA: Diagnosis not present

## 2019-11-15 DIAGNOSIS — R110 Nausea: Secondary | ICD-10-CM

## 2019-11-15 MED ORDER — ROPINIROLE HCL 0.5 MG PO TABS: 0.5000 mg | ORAL_TABLET | Freq: Every evening | ORAL | 1 refills | Status: DC

## 2019-11-15 NOTE — Telephone Encounter (Signed)
I called to [facility] and they are closed. I also called to [facility] they will have to review records first before scheduling. Judson Roch do you if pt called [place] [facility] to get sooner appt?

## 2019-11-15 NOTE — Assessment & Plan Note (Signed)
Uncontrolled.  Increase Requip.  Close follow-up.

## 2019-11-15 NOTE — Telephone Encounter (Signed)
I called patient to advise that referral was placed & that she ,ay want to try to call [facility] to let them know we have placed an urgent referral. I let her know this was being worked on from our end too.

## 2019-11-15 NOTE — Progress Notes (Signed)
Virtual Visit via Video Note  I connected with@  on 11/15/19 at 11:30 AM EDT by a video enabled telemedicine application and verified that I am speaking with the correct person using two identifiers.  Location patient: home Location provider:work  Persons participating in the virtual visit: patient, provider  I discussed the limitations of evaluation and management by telemedicine and the availability of in person appointments. The patient expressed understanding and agreed to proceed.  Interactive audio and video telecommunications were attempted between this provider and patient, however failed, due to patient having technical difficulties or patient did not have access to video capability.  We continued and completed visit with audio only.    HPI:  Complains of restless legs biltaerally keeping her  Up at night, on going for 5-6 years.  Describes as bugs crawling. Continues to have refractory nausea, some improvement. Has been on gabapentin for this.   This morning hasnt vomited, eating jello now.  Yesterday had jello, half of biscuit and gravy. Always nauseated but when eats nausea becomes severe. No RUQ  Pain.  Last BM yesterday , loose brown stool however prior too BM have been formed brown. Nonbloody.  Has been taking phenergan and alternating with zofran. Wearing scopolamine patch.  No longer in atrial fibrillation and doesn't plan to go back on amiodarone.   ED yesterday for nausea RUQ [place] didn't reveal gallbladder disease. Showed fatty liver disease.  Given protonix yesterday.    ROS: See pertinent positives and negatives per HPI.  Past Medical History:  Diagnosis Date  . Achilles tendinitis   . Acute medial meniscal tear   . Allergy   . Anxiety   . Arthritis   . Atrial fibrillation ([place])    a. new onset 07/2016; b. CHADS2VASc --> 2 (HTN, female); c. eliquis  . CHF (congestive heart failure) ([place])   . Chronic lower back pain   . COPD (chronic obstructive pulmonary  disease) ([place])   . Depression   . Fibrocystic breast disease   . GERD (gastroesophageal reflux disease)   . Hyperlipidemia   . Hypertension   . Insomnia   . Lipoma   . Migraine   . Osteoarthritis    left knee  . Osteopenia   . Sleep apnea    does not use her cpap  . Tendonitis   . Thyroid disease   . Vitamin D deficiency     Past Surgical History:  Procedure Laterality Date  . ABDOMINAL HYSTERECTOMY     ovaries left  . ACHILLES TENDON SURGERY     2012,01/2017  . BACK SURGERY    . CARDIOVERSION N/A 09/07/2016   Procedure: CARDIOVERSION;  Surgeon: Minna Merritts, MD;  Location: [facility] ORS;  Service: Cardiovascular;  Laterality: N/A;  . CARDIOVERSION N/A 10/19/2016   Procedure: CARDIOVERSION;  Surgeon: Minna Merritts, MD;  Location: [facility] ORS;  Service: Cardiovascular;  Laterality: N/A;  . CARDIOVERSION N/A 12/14/2016   Procedure: CARDIOVERSION;  Surgeon: Minna Merritts, MD;  Location: [facility] ORS;  Service: Cardiovascular;  Laterality: N/A;  . COLONOSCOPY    . ESOPHAGOGASTRODUODENOSCOPY    . THYROIDECTOMY     thyroid lobectomy secondary to a benign nodule  . TOTAL HIP ARTHROPLASTY Right 08/21/2018   Procedure: TOTAL HIP ARTHROPLASTY ANTERIOR APPROACH-RIGHT CELL SAVER REQUESTED;  Surgeon: [name], MD;  Location: [facility] ORS;  Service: Orthopedics;  Laterality: Right;    Family History  Problem Relation Age of Onset  . Cancer Sister 94  ovarian  . Depression Sister   . Cancer Father 10       colon  . Heart disease Father   . Heart attack Father 62       died from MI  . Hyperlipidemia Father   . Hypertension Father   . Cancer Mother        leukemia  . Breast cancer Paternal Aunt        63's  . Migraines Neg Hx       Current Outpatient Medications:  .  acetaminophen (TYLENOL) 500 MG tablet, Take 500 mg by mouth every 6 (six) hours as needed. Taking as needed, Disp: , Rfl:  .  albuterol (VENTOLIN HFA) 108 (90 Base) MCG/ACT inhaler, INHALE 2 PUFFS INTO THE  LUNGS EVERY 8 (EIGHT) HOURS AS NEEDED FOR WHEEZING OR SHORTNESS OF BREATH., Disp: 6.7 g, Rfl: 0 .  ALPRAZolam (XANAX) 0.25 MG tablet, TAKE 1 TABLET BY MOUTH DAILY AS NEEDED FOR ANXIETY OR SLEEP., Disp: 30 tablet, Rfl: 1 .  B Complex Vitamins (VITAMIN B COMPLEX PO), Take 1 tablet by mouth daily. , Disp: , Rfl:  .  bisoprolol (ZEBETA) 5 MG tablet, Take 1 tablet (5 mg total) by mouth as directed. Take one tablet in the AM and may take extra pill in PM if needed for heart rate, Disp: 180 tablet, Rfl: 3 .  calcium gluconate 500 MG tablet, Take 500 tablets by mouth daily., Disp: , Rfl:  .  Cholecalciferol (VITAMIN D3) 5000 units CAPS, Take 5,000 Units by mouth daily., Disp: , Rfl:  .  ELIQUIS 5 MG TABS tablet, TAKE 1 TABLET BY MOUTH TWICE A DAY, Disp: 60 tablet, Rfl: 3 .  esomeprazole (NEXIUM) 20 MG capsule, Take 20 mg by mouth daily as needed (acid reflux)., Disp: , Rfl:  .  furosemide (LASIX) 20 MG tablet, TAKE 1 TABLET (20 MG TOTAL) BY MOUTH 2 (TWO) TIMES DAILY., Disp: 180 tablet, Rfl: 3 .  hydrochlorothiazide (MICROZIDE) 12.5 MG capsule, Take 1 capsule (12.5 mg total) by mouth daily., Disp: 30 capsule, Rfl: 2 .  losartan (COZAAR) 100 MG tablet, TAKE 1 TABLET BY MOUTH EVERY DAY, Disp: 90 tablet, Rfl: 3 .  Multiple Vitamin (MULTIVITAMIN) tablet, Take 1 tablet by mouth daily., Disp: , Rfl:  .  ondansetron (ZOFRAN ODT) 4 MG disintegrating tablet, Take 1 tablet (4 mg total) by mouth every 8 (eight) hours as needed., Disp: 20 tablet, Rfl: 0 .  oxymetazoline (AFRIN) 0.05 % nasal spray, Place 1 spray into both nostrils at bedtime as needed for congestion. , Disp: , Rfl:  .  pantoprazole (PROTONIX) 40 MG tablet, Take 1 tablet (40 mg total) by mouth 2 (two) times daily., Disp: 30 tablet, Rfl: 1 .  potassium chloride (K-DUR) 10 MEQ tablet, TAKE 1 TABLET BY MOUTH TWICE A DAY, Disp: 180 tablet, Rfl: 1 .  promethazine (PHENERGAN) 12.5 MG tablet, Take 1 tablet (12.5 mg total) by mouth every 6 (six) hours as needed  for nausea or vomiting., Disp: 15 tablet, Rfl: 0 .  ranolazine (RANEXA) 500 MG 12 hr tablet, TAKE 1 TABLET BY MOUTH TWICE A DAY, Disp: 180 tablet, Rfl: 0 .  rizatriptan (MAXALT) 10 MG tablet, TAKE 1 TABLET (10 MG TOTAL) BY MOUTH AS NEEDED FOR MIGRAINE. MAY REPEAT IN 2 HOURS IF NEEDED, Disp: 10 tablet, Rfl: 0 .  rOPINIRole (REQUIP) 0.5 MG tablet, Take 1 tablet (0.5 mg total) by mouth every evening., Disp: 90 tablet, Rfl: 1 .  rosuvastatin (CRESTOR) 20 MG tablet,  TAKE 1 TABLET BY MOUTH EVERY DAY, Disp: 90 tablet, Rfl: 0 .  sertraline (ZOLOFT) 100 MG tablet, TAKE 1.5 TABLETS BY MOUTH ONCE DAILY, Disp: 135 tablet, Rfl: 2 .  scopolamine (TRANSDERM-SCOP) 1 MG/3DAYS, 1 patch every 3 (three) days., Disp: , Rfl:   EXAM:  VITALS per patient if applicable:  GENERAL: alert, oriented, appears well and in no acute distress   PSYCH/NEURO: pleasant and cooperative, no obvious depression or anxiety, speech and thought processing grossly intact  ASSESSMENT AND PLAN:  Discussed the following assessment and plan:  Restless leg - Plan: rOPINIRole (REQUIP) 0.5 MG tablet  Nausea  Restless leg syndrome Problem List Items Addressed This Visit      Other   Nausea    Some improvement.  Differential  includes amiodarone toxicity, gastritis, peptic ulcer disease.  Patient has now started Protonix.  Working on moving GI appointment up closer.  Patient will also call [facility] clinic gastroenterology to see if she can move appointment earlier.      Restless leg syndrome    Uncontrolled.  Increase Requip.  Close follow-up.      Relevant Medications   rOPINIRole (REQUIP) 0.5 MG tablet    Other Visit Diagnoses    Restless leg    -  Primary   Relevant Medications   rOPINIRole (REQUIP) 0.5 MG tablet      -we discussed possible serious and likely etiologies, options for evaluation and workup, limitations of telemedicine visit vs in person visit, treatment, treatment risks and precautions. Pt prefers to  treat via telemedicine empirically rather then risking or undertaking an in person visit at this moment. Patient agrees to seek prompt in person care if worsening, new symptoms arise, or if is not improving with treatment.   I discussed the assessment and treatment plan with the patient. The patient was provided an opportunity to ask questions and all were answered. The patient agreed with the plan and demonstrated an understanding of the instructions.   The patient was advised to call back or seek an in-person evaluation if the symptoms worsen or if the condition fails to improve as anticipated.   [name], FNP  I have spent 18 minutes with a patient including precharting, exam, reviewing medical records, and discussion plan of care.

## 2019-11-15 NOTE — Telephone Encounter (Signed)
R,  Anything we can do about this GI referral?  Feel terrible for pt, she has been to ED 3 times for nausea without relief. Her appt with [facility] isnt until 12/31/19. Can we see if [facility] can move up or will [facility] see her sooner?  I placed urgent referral to GI again if you need  Judson Roch, please let pt know that we are working on this. Advise her to call [facility] today and also ask for sooner appt. Advise that I have placed urgent referral and we are working from our end as well.

## 2019-11-15 NOTE — Telephone Encounter (Signed)
No I do not believe she had even though she had been advised to.

## 2019-11-15 NOTE — Assessment & Plan Note (Signed)
Some improvement.  Differential  includes amiodarone toxicity, gastritis, peptic ulcer disease.  Patient has now started Protonix.  Working on moving GI appointment up closer.  Patient will also call [facility] clinic gastroenterology to see if she can move appointment earlier.

## 2019-11-18 ENCOUNTER — Telehealth: Payer: Self-pay | Admitting: Family

## 2019-11-18 ENCOUNTER — Other Ambulatory Visit: Payer: Self-pay | Admitting: Gastroenterology

## 2019-11-18 DIAGNOSIS — R112 Nausea with vomiting, unspecified: Secondary | ICD-10-CM

## 2019-11-18 DIAGNOSIS — Z8679 Personal history of other diseases of the circulatory system: Secondary | ICD-10-CM | POA: Diagnosis not present

## 2019-11-18 DIAGNOSIS — R1310 Dysphagia, unspecified: Secondary | ICD-10-CM

## 2019-11-18 DIAGNOSIS — R6881 Early satiety: Secondary | ICD-10-CM | POA: Diagnosis not present

## 2019-11-18 DIAGNOSIS — R1319 Other dysphagia: Secondary | ICD-10-CM

## 2019-11-18 NOTE — Telephone Encounter (Signed)
Lvm for pt to schedule 1 month follow up.

## 2019-11-18 NOTE — Telephone Encounter (Signed)
Amber Barnes,  Can we see how we get this patient seen urgently with GI?  She thread below.   Judson Roch, would you call [facility] and see if we can move her appt earlier as well?   Would like to all work together on this and see if we cant get her a GI appt this week.

## 2019-11-18 NOTE — Telephone Encounter (Signed)
FYI I was able to get patient scheduled today at 3p with Octavia Bruckner, PA.

## 2019-11-18 NOTE — Telephone Encounter (Signed)
noted 

## 2019-11-19 ENCOUNTER — Other Ambulatory Visit: Payer: Self-pay

## 2019-11-19 ENCOUNTER — Ambulatory Visit
Admission: RE | Admit: 2019-11-19 | Discharge: 2019-11-19 | Disposition: A | Discharge status: Home / Self Care | Payer: Medicare Other | Source: Ambulatory Visit | Attending: Gastroenterology | Admitting: Gastroenterology

## 2019-11-19 DIAGNOSIS — R6881 Early satiety: Secondary | ICD-10-CM | POA: Insufficient documentation

## 2019-11-19 DIAGNOSIS — R1310 Dysphagia, unspecified: Secondary | ICD-10-CM | POA: Diagnosis not present

## 2019-11-19 DIAGNOSIS — R112 Nausea with vomiting, unspecified: Secondary | ICD-10-CM | POA: Insufficient documentation

## 2019-11-19 DIAGNOSIS — R1319 Other dysphagia: Secondary | ICD-10-CM

## 2019-11-19 DIAGNOSIS — K219 Gastro-esophageal reflux disease without esophagitis: Secondary | ICD-10-CM | POA: Diagnosis not present

## 2019-11-27 ENCOUNTER — Ambulatory Visit: Payer: Medicare Other | Admitting: Family

## 2019-11-27 ENCOUNTER — Ambulatory Visit (INDEPENDENT_AMBULATORY_CARE_PROVIDER_SITE_OTHER): Payer: Medicare Other

## 2019-11-27 VITALS — BP 130/62 | HR 82 | Ht 65.0 in | Wt 309.0 lb

## 2019-11-27 DIAGNOSIS — Z0000 Encounter for general adult medical examination without abnormal findings: Secondary | ICD-10-CM

## 2019-11-27 NOTE — Patient Instructions (Addendum)
  Amber Barnes , Thank you for taking time to come for your Medicare Wellness Visit. I appreciate your ongoing commitment to your health goals. Please review the following plan we discussed and let me know if I can assist you in the future.   These are the goals we discussed: Goals      Patient Stated   . I want to walk in the pool for exercise (pt-stated)       This is a list of the screening recommended for you and due dates:  Health Maintenance  Topic Date Due  . Colon Cancer Screening  10/10/2017  . Flu Shot  02/09/2020  . Mammogram  03/08/2020  . Tetanus Vaccine  11/02/2022  . DEXA scan (bone density measurement)  Completed  . COVID-19 Vaccine  Completed  .  Hepatitis C: One time screening is recommended by Center for Disease Control  (CDC) for  adults born from [birth year] through 1963.   Completed  . Pneumonia vaccines  Completed

## 2019-11-27 NOTE — Progress Notes (Addendum)
Subjective:   Amber Barnes is a 69 y.o. female who presents for an Initial Medicare Annual Wellness Visit.  Review of Systems    No ROS.  Medicare Wellness Virtual Visit.  Visual/audio telehealth visit.  Vitals provided by patient.  See social history for additional risk factors.    Cardiac Risk Factors include: advanced age (>31men, >39 women);obesity (BMI >30kg/m2);hypertension     Objective:    Today's Vitals   11/27/19 1140  BP: 130/62  Pulse: 82  Weight: (!) 309 lb (140.2 kg)  Height: 5\' 5"  (1.651 m)   Body mass index is 51.42 kg/m.  Advanced Directives 11/27/2019 11/13/2019 11/05/2019 10/30/2019 08/21/2018 08/21/2018 08/08/2018  Does Patient Have a Medical Advance Directive? Yes Yes No No No Yes Yes  Type of Paramedic of [place];Living will [place];Living will - - - [place];Living will [place];Living will  Does patient want to make changes to medical advance directive? No - Patient declined - - - - - -  Copy of [place] in Chart? No - copy requested - - - No - copy requested No - copy requested No - copy requested  Would patient like information on creating a medical advance directive? - - - No - Patient declined No - Patient declined - -    Current Medications (verified) Outpatient Encounter Medications as of 11/27/2019  Medication Sig  . acetaminophen (TYLENOL) 500 MG tablet Take 500 mg by mouth every 6 (six) hours as needed. Taking as needed  . albuterol (VENTOLIN HFA) 108 (90 Base) MCG/ACT inhaler INHALE 2 PUFFS INTO THE LUNGS EVERY 8 (EIGHT) HOURS AS NEEDED FOR WHEEZING OR SHORTNESS OF BREATH.  Marland Kitchen ALPRAZolam (XANAX) 0.25 MG tablet TAKE 1 TABLET BY MOUTH DAILY AS NEEDED FOR ANXIETY OR SLEEP.  . B Complex Vitamins (VITAMIN B COMPLEX PO) Take 1 tablet by mouth daily.   . bisoprolol (ZEBETA) 5 MG tablet Take 1 tablet (5 mg total) by mouth as directed. Take one  tablet in the AM and may take extra pill in PM if needed for heart rate  . calcium gluconate 500 MG tablet Take 500 tablets by mouth daily.  . Cholecalciferol (VITAMIN D3) 5000 units CAPS Take 5,000 Units by mouth daily.  Marland Kitchen ELIQUIS 5 MG TABS tablet TAKE 1 TABLET BY MOUTH TWICE A DAY  . esomeprazole (NEXIUM) 20 MG capsule Take 20 mg by mouth daily as needed (acid reflux).  . furosemide (LASIX) 20 MG tablet TAKE 1 TABLET (20 MG TOTAL) BY MOUTH 2 (TWO) TIMES DAILY.  . hydrochlorothiazide (MICROZIDE) 12.5 MG capsule Take 1 capsule (12.5 mg total) by mouth daily.  Marland Kitchen losartan (COZAAR) 100 MG tablet TAKE 1 TABLET BY MOUTH EVERY DAY  . Multiple Vitamin (MULTIVITAMIN) tablet Take 1 tablet by mouth daily.  . ondansetron (ZOFRAN ODT) 4 MG disintegrating tablet Take 1 tablet (4 mg total) by mouth every 8 (eight) hours as needed.  Marland Kitchen oxymetazoline (AFRIN) 0.05 % nasal spray Place 1 spray into both nostrils at bedtime as needed for congestion.   . pantoprazole (PROTONIX) 40 MG tablet Take 1 tablet (40 mg total) by mouth 2 (two) times daily.  . potassium chloride (K-DUR) 10 MEQ tablet TAKE 1 TABLET BY MOUTH TWICE A DAY  . promethazine (PHENERGAN) 12.5 MG tablet Take 1 tablet (12.5 mg total) by mouth every 6 (six) hours as needed for nausea or vomiting.  . ranolazine (RANEXA) 500 MG 12 hr tablet  TAKE 1 TABLET BY MOUTH TWICE A DAY  . rizatriptan (MAXALT) 10 MG tablet TAKE 1 TABLET (10 MG TOTAL) BY MOUTH AS NEEDED FOR MIGRAINE. MAY REPEAT IN 2 HOURS IF NEEDED  . rOPINIRole (REQUIP) 0.5 MG tablet Take 1 tablet (0.5 mg total) by mouth every evening.  . rosuvastatin (CRESTOR) 20 MG tablet TAKE 1 TABLET BY MOUTH EVERY DAY  . scopolamine (TRANSDERM-SCOP) 1 MG/3DAYS 1 patch every 3 (three) days.  Marland Kitchen sertraline (ZOLOFT) 100 MG tablet TAKE 1.5 TABLETS BY MOUTH ONCE DAILY   No facility-administered encounter medications on file as of 11/27/2019.    Allergies (verified) Prednisone, Hydrocodone-acetaminophen, Oxycodone,  Tramadol, Adhesive [tape], and Latex   History: Past Medical History:  Diagnosis Date  . Achilles tendinitis   . Acute medial meniscal tear   . Allergy   . Anxiety   . Arthritis   . Atrial fibrillation ([place])    a. new onset 07/2016; b. CHADS2VASc --> 2 (HTN, female); c. eliquis  . CHF (congestive heart failure) ([place])   . Chronic lower back pain   . COPD (chronic obstructive pulmonary disease) ([place])   . Depression   . Fibrocystic breast disease   . GERD (gastroesophageal reflux disease)   . Hyperlipidemia   . Hypertension   . Insomnia   . Lipoma   . Migraine   . Osteoarthritis    left knee  . Osteopenia   . Sleep apnea    does not use her cpap  . Tendonitis   . Thyroid disease   . Vitamin D deficiency    Past Surgical History:  Procedure Laterality Date  . ABDOMINAL HYSTERECTOMY     ovaries left  . ACHILLES TENDON SURGERY     2012,01/2017  . BACK SURGERY    . CARDIOVERSION N/A 09/07/2016   Procedure: CARDIOVERSION;  Surgeon: Minna Merritts, MD;  Location: [facility] ORS;  Service: Cardiovascular;  Laterality: N/A;  . CARDIOVERSION N/A 10/19/2016   Procedure: CARDIOVERSION;  Surgeon: Minna Merritts, MD;  Location: [facility] ORS;  Service: Cardiovascular;  Laterality: N/A;  . CARDIOVERSION N/A 12/14/2016   Procedure: CARDIOVERSION;  Surgeon: Minna Merritts, MD;  Location: [facility] ORS;  Service: Cardiovascular;  Laterality: N/A;  . COLONOSCOPY    . ESOPHAGOGASTRODUODENOSCOPY    . THYROIDECTOMY     thyroid lobectomy secondary to a benign nodule  . TOTAL HIP ARTHROPLASTY Right 08/21/2018   Procedure: TOTAL HIP ARTHROPLASTY ANTERIOR APPROACH-RIGHT CELL SAVER REQUESTED;  Surgeon: [name], MD;  Location: [facility] ORS;  Service: Orthopedics;  Laterality: Right;   Family History  Problem Relation Age of Onset  . Cancer Sister 20       ovarian  . Depression Sister   . Cancer Father 31       colon  . Heart disease Father   . Heart attack Father 69       died from MI  .  Hyperlipidemia Father   . Hypertension Father   . Cancer Mother        leukemia  . Breast cancer Paternal Aunt        33's  . Migraines Neg Hx    Social History   Socioeconomic History  . Marital status: Married    Spouse name: jeffrey  . Number of children: 1  . Years of education: Not on file  . Highest education level: High school graduate  Occupational History    Comment: disabled  Tobacco Use  . Smoking status: Former Smoker  Packs/day: 0.25    Years: 10.00    Pack years: 2.50    Types: Cigarettes    Quit date: 2005    Years since quitting: 16.3  . Smokeless tobacco: Never Used  . Tobacco comment: smoked for 10 years, 1 pack every 2-3 days  Substance and Sexual Activity  . Alcohol use: Yes    Comment: social, occasional  . Drug use: No  . Sexual activity: Not Currently  Other Topics Concern  . Not on file  Social History Narrative   Moved to [place] from [place], originally from [place].    1 cup of caffeine daily   Right handed          Social Determinants of Health   Financial Resource Strain:   . Difficulty of Paying Living Expenses:   Food Insecurity:   . Worried About Charity fundraiser in the Last Year:   . Arboriculturist in the Last Year:   Transportation Needs:   . Film/video editor (Medical):   Marland Kitchen Lack of Transportation (Non-Medical):   Physical Activity:   . Days of Exercise per Week:   . Minutes of Exercise per Session:   Stress:   . Feeling of Stress :   Social Connections:   . Frequency of Communication with Friends and Family:   . Frequency of Social Gatherings with Friends and Family:   . Attends Religious Services:   . Active Member of Clubs or Organizations:   . Attends Archivist Meetings:   Marland Kitchen Marital Status:     Tobacco Counseling Counseling given: Not Answered Comment: smoked for 10 years, 1 pack every 2-3 days   Clinical Intake:  Pre-visit preparation completed: Yes            How often do you need to have someone help you when you read instructions, pamphlets, or other written materials from your doctor or pharmacy?: 1 - Never  Interpreter Needed?: No      Activities of Daily Living In your present state of health, do you have any difficulty performing the following activities: 11/27/2019 10/18/2019  Hearing? N N  Vision? N N  Difficulty concentrating or making decisions? N N  Walking or climbing stairs? Y Y  Dressing or bathing? N N  Doing errands, shopping? N Y  Conservation officer, nature and eating ? N -  Using the Toilet? N -  In the past six months, have you accidently leaked urine? Y -  Comment Managed with daily pad -  Do you have problems with loss of bowel control? N -  Managing your Medications? N -  Managing your Finances? N -  Housekeeping or managing your Housekeeping? Y -  Comment Maid assist every 2 weeks -  Some recent data might be hidden     Immunizations and Health Maintenance Immunization History  Administered Date(s) Administered  . Fluad Quad(high Dose 65+) 04/08/2019  . Influenza Inj Mdck Quad Pf 04/03/2017  . Influenza,inj,Quad PF,6+ Mos 04/14/2016, 03/30/2018  . Influenza-Unspecified 03/07/2019  . PFIZER SARS-COV-2 Vaccination 08/23/2019, 09/18/2019  . Pneumococcal Conjugate-13 03/30/2018  . Pneumococcal Polysaccharide-23 04/18/2019  . Pneumococcal-Unspecified 03/23/2012  . Td 11/01/2012  . Zoster 10/07/2012   Health Maintenance Due  Topic Date Due  . COLONOSCOPY  10/10/2017    Patient Care Team: [name], FNP as PCP - General (Family Medicine) Minna Merritts, MD as PCP - Cardiology (Cardiology) Charletta Cousin, MD (Inactive) [name], [name], MD as  Consulting Physician (Cardiology)  Indicate any recent Medical Services you may have received from other than [facility] providers in the past year (date may be approximate).     Assessment:   This is a routine wellness examination for [place].   Nurse connected  with patient 11/27/19 at 11:30 AM EDT by a telephone enabled telemedicine application, patient has no access to video or difficulty performing video and verified that I am speaking with the correct person using two identifiers. Patient stated full name and DOB. Patient gave permission to continue with virtual visit. Patient's location was at home and Nurse's location was at [place] office.   Patient is alert and oriented x3. Patient denies difficulty focusing or concentrating. Patient likes to read, play word search games and scrabble for brain health.   Health Maintenance Due: -Colonoscopy- deferred per patient request; currently followed by GI.  See completed HM at the end of note.   Eye: Visual acuity not assessed. Virtual visit. Followed by their ophthalmologist.  Dental: Visits every  months.    Hearing: Demonstrates normal hearing during visit.  Safety:  Patient feels safe at home- yes Patient does have smoke detectors at home- yes Patient does wear sunscreen or protective clothing when in direct sunlight - yes Patient does wear seat belt when in a moving vehicle - yes Patient drives- yes Adequate lighting in walkways free from debris- yes Grab bars and handrails used as appropriate- yes Ambulates with an assistive device- yes; cane  Cell phone on person when ambulating outside of the home- yes  Social: Alcohol intake - yes, occ Smoking history- former   Smokers in home? none Illicit drug use? none  Medication: Taking as directed and without issues.  Pill box in use -yes  Self managed - yes   Covid-19: Precautions and sickness symptoms discussed. Wears mask, social distancing, hand hygiene as appropriate. Orders all groceries.   Activities of Daily Living Patient denies needing assistance with: feeding themselves, getting from bed to chair, getting to the toilet, bathing/showering, dressing, managing money, or preparing meals.  Maid assist with household chores as  needed.   Discussed the importance of a healthy diet, water intake and the benefits of aerobic exercise.   Physical activity- walking daily  Diet:  Soft diet Water: good intake Caffeine: not regular  Other Providers Patient Care Team: [name], FNP as PCP - General (Family Medicine) [name], [name], MD as PCP - Cardiology (Cardiology) Charletta Cousin, MD (Inactive) [name], [name], MD as Consulting Physician (Cardiology) Hearing/Vision screen  Hearing Screening   125Hz  250Hz  500Hz  1000Hz  2000Hz  3000Hz  4000Hz  6000Hz  8000Hz   Right ear:           Left ear:           Comments: Patient is able to hear conversational tones without difficulty.  No issues reported.  Vision Screening Comments: Wears corrective lenses Visual acuity not assessed, virtual visit.  They have seen their ophthalmologist.     Dietary issues and exercise activities discussed: Current Exercise Habits: Home exercise routine, Type of exercise: walking, Intensity: Mild  Goals      Patient Stated   . I want to walk in the pool for exercise (pt-stated)      Depression Screen PHQ 2/9 Scores 11/27/2019 10/18/2019 07/19/2019 11/14/2018 07/25/2018 06/21/2018 05/31/2018  PHQ - 2 Score 0 0 0 1 4 0 0  PHQ- 9 Score 2 2 2 13 23  - -    Fall Risk Fall Risk  11/27/2019  07/23/2018 06/21/2018 06/14/2018 05/31/2018  Falls in the past year? 0 1 0 0 0  Number falls in past yr: - 0 - - -  Comment - patient lost her balance.  denies injury - - -  Injury with Fall? - 0 - - -  Follow up Falls evaluation completed - - - -   Timed Get Up and Go Performed no, virtual visit  Cognitive Function:     6CIT Screen 11/27/2019  What Year? 0 points  What month? 0 points  Months in reverse 0 points  Repeat phrase 0 points    Screening Tests Health Maintenance  Topic Date Due  . COLONOSCOPY  10/10/2017  . INFLUENZA VACCINE  02/09/2020  . MAMMOGRAM  03/08/2020  . TETANUS/TDAP  11/02/2022  . DEXA SCAN  Completed  .  COVID-19 Vaccine  Completed  . Hepatitis C Screening  Completed  . PNA vac Low Risk Adult  Completed     Plan:   Keep all routine maintenance appointments.   Follow up 12/18/19 @ 2:30  Medicare Attestation I have personally reviewed: The patient's medical and social history Their use of alcohol, tobacco or illicit drugs Their current medications and supplements The patient's functional ability including ADLs,fall risks, home safety risks, cognitive, and hearing and visual impairment Diet and physical activities Evidence for depression   I have reviewed and discussed with patient certain preventive protocols, quality metrics, and best practice recommendations.     Varney Biles, LPN   [id number]    Agree with plan. [name], NP

## 2019-12-04 ENCOUNTER — Other Ambulatory Visit: Payer: Self-pay | Admitting: Family

## 2019-12-04 DIAGNOSIS — F419 Anxiety disorder, unspecified: Secondary | ICD-10-CM

## 2019-12-04 DIAGNOSIS — F32A Depression, unspecified: Secondary | ICD-10-CM

## 2019-12-04 NOTE — Telephone Encounter (Signed)
Refill request for Xanax, last seen 11-15-19, last filled 07-31-19.  Please advise.

## 2019-12-04 NOTE — Telephone Encounter (Signed)
Hi Amber Barnes,   Thanks for checking. I told her at clinic visit that further refills will need to come from her primary care office. I only gave her 15 tabs.   Thanks,  Loel Dubonnet, NP

## 2019-12-08 ENCOUNTER — Other Ambulatory Visit: Payer: Self-pay | Admitting: Family

## 2019-12-16 ENCOUNTER — Telehealth: Payer: Self-pay | Admitting: Family

## 2019-12-16 NOTE — Telephone Encounter (Signed)
Reviewing chart Pt never came in April for fasting labs In particular, I was looking at iron stores, tsh, lipid and a1c  Does she want me to order?

## 2019-12-18 ENCOUNTER — Encounter: Payer: Self-pay | Admitting: Family

## 2019-12-18 ENCOUNTER — Telehealth: Payer: Self-pay | Admitting: Family

## 2019-12-18 ENCOUNTER — Telehealth (INDEPENDENT_AMBULATORY_CARE_PROVIDER_SITE_OTHER): Payer: Medicare Other | Admitting: Family

## 2019-12-18 VITALS — BP 160/80 | HR 58

## 2019-12-18 DIAGNOSIS — I10 Essential (primary) hypertension: Secondary | ICD-10-CM

## 2019-12-18 DIAGNOSIS — Z1231 Encounter for screening mammogram for malignant neoplasm of breast: Secondary | ICD-10-CM | POA: Diagnosis not present

## 2019-12-18 DIAGNOSIS — R0981 Nasal congestion: Secondary | ICD-10-CM | POA: Insufficient documentation

## 2019-12-18 DIAGNOSIS — J449 Chronic obstructive pulmonary disease, unspecified: Secondary | ICD-10-CM

## 2019-12-18 DIAGNOSIS — R110 Nausea: Secondary | ICD-10-CM

## 2019-12-18 DIAGNOSIS — J3489 Other specified disorders of nose and nasal sinuses: Secondary | ICD-10-CM | POA: Insufficient documentation

## 2019-12-18 DIAGNOSIS — F419 Anxiety disorder, unspecified: Secondary | ICD-10-CM

## 2019-12-18 NOTE — Telephone Encounter (Signed)
Patient scheduled for labs on 12/24/2019. Would like labs ordered.

## 2019-12-18 NOTE — Assessment & Plan Note (Signed)
Slightly elevated. In the past has had good control. She will send me BP log and will adjust if necessary

## 2019-12-18 NOTE — Assessment & Plan Note (Signed)
Duration 2 days, patient is afebrile.  In agreement with conservative management including trial of antihistamine for suspected allergies.  patient understands to call me with any new or worsening symptoms

## 2019-12-18 NOTE — Assessment & Plan Note (Signed)
Overall controlled at baseline.  We will continue as needed xanax.  Patient understands risk of this medication

## 2019-12-18 NOTE — Assessment & Plan Note (Signed)
Asymptomatic.  We will continue to follow

## 2019-12-18 NOTE — Telephone Encounter (Signed)
Pt had to reschedule appt due to headache. Pt wanted to reschedule but she has no avail appts until mid July. Please advise.

## 2019-12-18 NOTE — Progress Notes (Signed)
Verbal consent for services obtained from patient prior to services given to TELEPHONE visit:   Location of call:  provider at work patient at home  Names of all persons present for services: [name], NP and patient Chief complaint:   Follow up   GAD- overall controlled. takes xanax 4 days per week prn.   HTN- improved when started to take losartan.  115/60, 120/70. No cp, sob. No nsaids.   Had planned to come to office but converted to telephone when developed nasal drainage.   Clear  nasal drainage, x 2 days. Had been outside and thinks may be allergy related.  Had HA yesterday.  Has taken tylenol with relief. No fever. hasnt been on antihistamine.   Nausea- persists, comes and goes, better as it had been consistent in the past. continues to take protonix, carafate. Takes zofran, scopolamine patch with relief.  Thinks stress contributory. Continues to loose weight however has weighed herself. Trying to eat small, frequent meals.  Able to eat potatoes, jello, gravy biscuits. Able to drink liquid.   COPD-breathing well.  No shortness of breath.  Has not had to use inhaler in quite some time.  Labs are scheduled.   History, background, results pertinent:   Consult with GI, [place] 11/18/19   x-ray upper GI series ( severe GERD), start Carafate, still Protonix 40 mg BID  Of note, she is no longer following with pain management Due colonoscopy  Mammogram due  BP Readings from Last 3 Encounters:  12/18/19 (!) 160/80  11/27/19 130/62  11/14/19 (!) 160/74    A/P/next steps:  Problem List Items Addressed This Visit      Cardiovascular and Mediastinum   Hypertension    Slightly elevated. In the past has had good control. She will send me BP log and will adjust if necessary        Respiratory   COPD (chronic obstructive pulmonary disease) ([facility])    Asymptomatic.  We will continue to follow      Sinus congestion    Duration 2 days, patient is afebrile.  In  agreement with conservative management including trial of antihistamine for suspected allergies.  patient understands to call me with any new or worsening symptoms        Other   Anxiety    Overall controlled at baseline.  We will continue as needed xanax.  Patient understands risk of this medication      Nasal drainage   Nausea    Please see her some improvement albeit modest.  Patient has follow-up with GI a couple weeks.  She continue Carafate, Protonix as needed antinausea medications.  She is aware that she is overdue for colonoscopy and she will discuss this again with gastroenterology.       Other Visit Diagnoses    Encounter for screening mammogram for malignant neoplasm of breast    -  Primary   Relevant Orders   MM 3D SCREEN BREAST BILATERAL     Note, patient understands schedule mammogram.    I spent 20 min  discussing plan of care over the phone.

## 2019-12-18 NOTE — Assessment & Plan Note (Signed)
Please see her some improvement albeit modest.  Patient has follow-up with GI a couple weeks.  She continue Carafate, Protonix as needed antinausea medications.  She is aware that she is overdue for colonoscopy and she will discuss this again with gastroenterology.

## 2019-12-20 NOTE — Telephone Encounter (Signed)
Called patient to try and reschedule. Pt states that she is still sick on her stomach and the headache has subsided. Declines scheduling an appointment for now as she just received a call from her GI doctor. Wants to wait until she hears back from them before scheduling next appointment.

## 2019-12-21 ENCOUNTER — Other Ambulatory Visit: Payer: Self-pay | Admitting: Cardiovascular Disease

## 2019-12-21 ENCOUNTER — Other Ambulatory Visit: Payer: Self-pay | Admitting: Family

## 2019-12-21 DIAGNOSIS — K760 Fatty (change of) liver, not elsewhere classified: Secondary | ICD-10-CM

## 2019-12-23 NOTE — Telephone Encounter (Signed)
noted 

## 2019-12-30 ENCOUNTER — Other Ambulatory Visit: Payer: Self-pay | Admitting: Cardiovascular Disease

## 2019-12-30 NOTE — Telephone Encounter (Signed)
Please review for refill. Thanks!  

## 2019-12-30 NOTE — Telephone Encounter (Signed)
Pt's age 69, wt 140.2 kg, SCr 0.87, CrCl 138.88, last ov w/ TG 11/12/19

## 2020-01-02 ENCOUNTER — Other Ambulatory Visit: Payer: Medicare Other

## 2020-02-03 ENCOUNTER — Other Ambulatory Visit: Payer: Self-pay | Admitting: Cardiovascular Disease

## 2020-02-24 ENCOUNTER — Ambulatory Visit: Payer: Medicare Other | Admitting: Cardiovascular Disease

## 2020-02-28 ENCOUNTER — Encounter: Payer: Self-pay | Admitting: Family

## 2020-03-15 ENCOUNTER — Other Ambulatory Visit: Payer: Self-pay | Admitting: Family

## 2020-03-15 DIAGNOSIS — F419 Anxiety disorder, unspecified: Secondary | ICD-10-CM

## 2020-03-15 DIAGNOSIS — F32A Depression, unspecified: Secondary | ICD-10-CM

## 2020-03-20 ENCOUNTER — Other Ambulatory Visit: Payer: Self-pay

## 2020-03-20 ENCOUNTER — Encounter: Payer: Self-pay | Admitting: Family

## 2020-03-20 ENCOUNTER — Ambulatory Visit (INDEPENDENT_AMBULATORY_CARE_PROVIDER_SITE_OTHER): Payer: Medicare Other | Admitting: Family

## 2020-03-20 VITALS — BP 144/86 | HR 61 | Temp 98.3°F | Ht 65.0 in | Wt 297.0 lb

## 2020-03-20 DIAGNOSIS — K219 Gastro-esophageal reflux disease without esophagitis: Secondary | ICD-10-CM

## 2020-03-20 DIAGNOSIS — R634 Abnormal weight loss: Secondary | ICD-10-CM

## 2020-03-20 DIAGNOSIS — Z8041 Family history of malignant neoplasm of ovary: Secondary | ICD-10-CM | POA: Diagnosis not present

## 2020-03-20 DIAGNOSIS — Z23 Encounter for immunization: Secondary | ICD-10-CM

## 2020-03-20 DIAGNOSIS — I10 Essential (primary) hypertension: Secondary | ICD-10-CM | POA: Diagnosis not present

## 2020-03-20 DIAGNOSIS — Z6841 Body Mass Index (BMI) 40.0 and over, adult: Secondary | ICD-10-CM

## 2020-03-20 DIAGNOSIS — R110 Nausea: Secondary | ICD-10-CM | POA: Diagnosis not present

## 2020-03-20 DIAGNOSIS — F331 Major depressive disorder, recurrent, moderate: Secondary | ICD-10-CM

## 2020-03-20 DIAGNOSIS — F419 Anxiety disorder, unspecified: Secondary | ICD-10-CM

## 2020-03-20 NOTE — Patient Instructions (Addendum)
Please take bisoprolol when you get home.   It is imperative that you are seen AT least twice per year for labs and monitoring. Monitor blood pressure at home and me 5-6 reading on separate days. Goal is less than 120/80, based on newest guidelines, however we certainly want to be less than 130/80;  if persistently higher, please make sooner follow up appointment so we can recheck you blood pressure and manage/ adjust medications.   Fasting lab in one week  Worried very much about extreme weight loss. It is imperative that you have colonoscopy, Upper scope and mammogram up to date.  Referral to hematology Let us know if you dont hear back within a week in regards to an appointment being scheduled.    Please call  and schedule your 3D mammogram   [facility]  [place], [place]   Stay safe!

## 2020-03-20 NOTE — Progress Notes (Signed)
Subjective:    Patient ID: Amber Barnes, female    DOB: 1950/11/07, 69 y.o.   MRN: [id number]  CC: Amber Barnes is a 69 y.o. female who presents today for follow up.   HPI: Accompanied by daughter in law  HTN-at home 120-135/60-70.  hasnt taken bisoprolol yet today. Took losartan , ranexa last night.   Compliant with eliquis. No bleeding. No CP, palpiations Breathing well.   GAD- controlled on zoloft and prn xanax working well. Xanax every couple of weeks.  Nausea somewhat improved in that nausea comes and goes. Has good days and bad days. Today has more nausea without vomiting however no clear reason for this. Nausea associated with vomiting at time.  Nauseated with and without eating. If really hungry, will also become nauseated.   Has been off of protonix and carafate  For 2 months as felt making nausea worse.  Started probiotic and feels has been helpful with nausea.   Endorses bloating, early satiety and intermittent constipation on days in which she doesn't eat.  No change in stool shape/ size. No blood in stool.  Father had colon cancer.   Mostly eating potatoes, bread which seem to 'not to bother' her as much. Bread and laying supine makes nausea better.   Not eating fruits and vegetables.   No fried food, pizza, spicy foods in a long time as worsened nausea.  She reports that she has lost in total 75lbs in 6 months.  Some nights, she drenches the sheets in sweat. No unusual bone pain.   Consult with Octavia Bruckner 11/2019:  protonix 40mg  BID, added carafate.  Upper GI series showed severe GERD, no evidence of hiatal hernia.  Family h/o colon cancer ovarian cancer and leukemia Follow up 04/08/20.   hasnt had EGD. Follow up with Cornelia Copa 04/06/20  H/o hysterectomy  Due mammogram, colonoscopy  HISTORY:  Past Medical History:  Diagnosis Date  . Achilles tendinitis   . Acute medial meniscal tear   . Allergy   . Anxiety   . Arthritis   . Atrial fibrillation  ([place])    a. new onset 07/2016; b. CHADS2VASc --> 2 (HTN, female); c. eliquis  . CHF (congestive heart failure) ([place])   . Chronic lower back pain   . COPD (chronic obstructive pulmonary disease) ([place])   . Depression   . Fibrocystic breast disease   . GERD (gastroesophageal reflux disease)   . Hyperlipidemia   . Hypertension   . Insomnia   . Lipoma   . Migraine   . Osteoarthritis    left knee  . Osteopenia   . Sleep apnea    does not use her cpap  . Tendonitis   . Thyroid disease   . Vitamin D deficiency    Past Surgical History:  Procedure Laterality Date  . ABDOMINAL HYSTERECTOMY     ovaries left  . ACHILLES TENDON SURGERY     2012,01/2017  . BACK SURGERY    . CARDIOVERSION N/A 09/07/2016   Procedure: CARDIOVERSION;  Surgeon: Minna Merritts, MD;  Location: [facility] OR[facility];  Service: Cardiovascular;  Laterality: N/A;  . CARDIOVERSION N/A 10/19/2016   Procedure: CARDIOVERSION;  Surgeon: Minna Merritts, MD;  Location: [facility];  Service: Cardiovascular;  Laterality: N/A;  . CARDIOVERSION N/A 12/14/2016   Procedure: CARDIOVERSION;  Surgeon: Minna Merritts, MD;  Location: [facility];  Service: Cardiovascular;  Laterality: N/A;  . COLONOSCOPY    . ESOPHAGOGASTRODUODENOSCOPY    . THYROIDECTOMY  thyroid lobectomy secondary to a benign nodule  . TOTAL HIP ARTHROPLASTY Right 08/21/2018   Procedure: TOTAL HIP ARTHROPLASTY ANTERIOR APPROACH-RIGHT CELL SAVER REQUESTED;  Surgeon: [name], MD;  Location: [facility];  Service: Orthopedics;  Laterality: Right;   Family History  Problem Relation Age of Onset  . Cancer Sister 36       ovarian  . Depression Sister   . Cancer Father 76       colon  . Heart disease Father   . Heart attack Father 59       died from MI  . Hyperlipidemia Father   . Hypertension Father   . Cancer Mother        leukemia  . Breast cancer Paternal Aunt        48's  . Migraines Neg Hx     Allergies: Prednisone, Hydrocodone-acetaminophen, Oxycodone,  Tramadol, Adhesive [tape], and Latex Current Outpatient Medications on File Prior to Visit  Medication Sig Dispense Refill  . acetaminophen (TYLENOL) 500 MG tablet Take 500 mg by mouth every 6 (six) hours as needed. Taking as needed    . albuterol (VENTOLIN HFA) 108 (90 Base) MCG/ACT inhaler INHALE 2 PUFFS INTO THE LUNGS EVERY 8 (EIGHT) HOURS AS NEEDED FOR WHEEZING OR SHORTNESS OF BREATH. 6.7 g 0  . ALPRAZolam (XANAX) 0.25 MG tablet TAKE 1 TABLET BY MOUTH DAILY AS NEEDED FOR ANXIETY OR SLEEP. 30 tablet 1  . B Complex Vitamins (VITAMIN B COMPLEX PO) Take 1 tablet by mouth daily.     . bisoprolol (ZEBETA) 5 MG tablet Take 1 tablet (5 mg total) by mouth as directed. Take one tablet in the AM and may take extra pill in PM if needed for heart rate 180 tablet 3  . calcium gluconate 500 MG tablet Take 500 tablets by mouth daily.    . Cholecalciferol (VITAMIN D3) 5000 units CAPS Take 5,000 Units by mouth daily.    Marland Kitchen ELIQUIS 5 MG TABS tablet TAKE 1 TABLET BY MOUTH TWICE A DAY 60 tablet 6  . furosemide (LASIX) 20 MG tablet TAKE 1 TABLET (20 MG TOTAL) BY MOUTH 2 (TWO) TIMES DAILY. 180 tablet 3  . hydrochlorothiazide (MICROZIDE) 12.5 MG capsule TAKE 1 CAPSULE BY MOUTH EVERY DAY 90 capsule 1  . losartan (COZAAR) 100 MG tablet TAKE 1 TABLET BY MOUTH EVERY DAY 90 tablet 3  . Multiple Vitamin (MULTIVITAMIN) tablet Take 1 tablet by mouth daily.    Marland Kitchen oxymetazoline (AFRIN) 0.05 % nasal spray Place 1 spray into both nostrils at bedtime as needed for congestion.     . potassium chloride (KLOR-CON) 10 MEQ tablet TAKE 1 TABLET BY MOUTH TWICE A DAY 180 tablet 0  . rizatriptan (MAXALT) 10 MG tablet TAKE 1 TABLET (10 MG TOTAL) BY MOUTH AS NEEDED FOR MIGRAINE. MAY REPEAT IN 2 HOURS IF NEEDED 10 tablet 0  . rOPINIRole (REQUIP) 0.5 MG tablet Take 1 tablet (0.5 mg total) by mouth every evening. 90 tablet 1  . sertraline (ZOLOFT) 100 MG tablet TAKE 1 AND 1/2 TABLETS BY MOUTH ONCE DAILY 135 tablet 2   No current  facility-administered medications on file prior to visit.    Social History   Tobacco Use  . Smoking status: Former Smoker    Packs/day: 0.25    Years: 10.00    Pack years: 2.50    Types: Cigarettes    Quit date: 2005    Years since quitting: 16.7  . Smokeless tobacco: Never Used  . Tobacco comment:  smoked for 10 years, 1 pack every 2-3 days  Vaping Use  . Vaping Use: Never used  Substance Use Topics  . Alcohol use: Yes    Comment: social, occasional  . Drug use: No    Review of Systems  Constitutional: Positive for diaphoresis and unexpected weight change. Negative for chills and fever.  Respiratory: Negative for cough and shortness of breath.   Cardiovascular: Negative for chest pain and palpitations.  Gastrointestinal: Positive for abdominal distention, nausea and vomiting. Negative for abdominal pain and blood in stool.  Psychiatric/Behavioral: Negative for sleep disturbance. The patient is not nervous/anxious.       Objective:    BP (!) 144/86 (BP Location: Left Arm, Patient Position: Sitting, Cuff Size: Large)   Pulse 61   Temp 98.3 F (36.8 C)   Ht 5\' 5"  (1.651 m)   Wt 297 lb (134.7 kg)   SpO2 95%   BMI 49.42 kg/m  BP Readings from Last 3 Encounters:  03/20/20 (!) 144/86  12/18/19 (!) 160/80  11/27/19 130/62   Wt Readings from Last 3 Encounters:  03/20/20 297 lb (134.7 kg)  11/27/19 (!) 309 lb (140.2 kg)  11/15/19 (!) 309 lb (140.2 kg)    Physical Exam Vitals reviewed.  Constitutional:      Appearance: Normal appearance. She is well-developed.  Eyes:     Conjunctiva/sclera: Conjunctivae normal.  Cardiovascular:     Rate and Rhythm: Normal rate and regular rhythm.     Pulses: Normal pulses.     Heart sounds: Normal heart sounds.  Pulmonary:     Effort: Pulmonary effort is normal.     Breath sounds: Normal breath sounds. No wheezing, rhonchi or rales.  Abdominal:     General: Bowel sounds are normal. There is no distension.     Palpations:  Abdomen is soft. Abdomen is not rigid. There is no fluid wave or mass.     Tenderness: There is abdominal tenderness in the epigastric area. There is no guarding or rebound.       Comments: Obese, discomfort with deep palpation of epigastric region.   Skin:    General: Skin is warm and dry.  Neurological:     Mental Status: She is alert.  Psychiatric:        Speech: Speech normal.        Behavior: Behavior normal.        Thought Content: Thought content normal.        Assessment & Plan:   Problem List Items Addressed This Visit      Cardiovascular and Mediastinum   Hypertension    Elevated today however as hasnt taken bisoprolol yet today. Advised to do so when she gets home. Advised to call with me BP, HR readings.      Relevant Orders   Lipid panel   Hemoglobin A1c     Digestive   GERD (gastroesophageal reflux disease)    Severe. trialed nexium and protonix without relief.  H pylori positive and I have consulted with Octavia Bruckner; he stated he will call in quadruple therapy and advise patient on follow. We have left message with patient to discus this as well as make her aware that Cornelia Copa will be calling.         Other   Anxiety    Controlled. Continue regimen.       Family history of ovarian cancer   Relevant Orders   CA 125   Ambulatory referral to Hematology   Morbid  obesity with BMI of 50.0-59.9, adult (HCC) (Chronic)   Nausea - Primary    Difficult case.Severe GERD seen on upper GI series.   differential includes PUD.  Alternately consider gastroparesis.  She is off protonix, carafate. Very much advised pursuing colonoscopy and EGD which she plans to discuss at follow up this month.  Pending h pylori , stool studies. Close follow up.        Relevant Orders   H. pylori breath test (Completed)   Ova and parasite examination   Fecal occult blood, imunochemical   Weight loss    Patient had weighed 340lbs in Feb of this year. Lost 43 lbs which I expect to be  most likely be related to refractory nausea. In the setting of night sweats however we agreed that consult with hematology for malignancy work up appropriate and also genetic counseling due to family h/o ovarian cancer. Pending ca 125. CT abdomen and pelvis revealed no adnexa mass 10/2019. We agreed to defer ordering further abdominal imaging until she is seen by hematology and gastroenterology. Emphasized the importance of updating preventative screens - colonoscopy, mammogram. Will follow.       Relevant Orders   CA 125   Ambulatory referral to Hematology   CBC with Differential/Platelet   Comprehensive metabolic panel   TSH    Other Visit Diagnoses    MDD (major depressive disorder), recurrent episode, moderate ([place])   (Chronic)         I have discontinued [name] "Jackie"'s promethazine, pantoprazole, ondansetron, scopolamine, and sucralfate. I am also having her maintain her multivitamin, B Complex Vitamins (VITAMIN B COMPLEX PO), oxymetazoline, Vitamin D3, calcium gluconate, rizatriptan, furosemide, albuterol, acetaminophen, losartan, bisoprolol, rOPINIRole, ALPRAZolam, hydrochlorothiazide, Eliquis, potassium chloride, and sertraline.   No orders of the defined types were placed in this encounter.   Return precautions given.   Risks, benefits, and alternatives of the medications and treatment plan prescribed today were discussed, and patient expressed understanding.   Education regarding symptom management and diagnosis given to patient on AVS.  Continue to follow with [name], FNP for routine health maintenance.   [name] and I agreed with plan.   [name], FNP

## 2020-03-21 ENCOUNTER — Other Ambulatory Visit: Payer: Self-pay | Admitting: Family

## 2020-03-21 ENCOUNTER — Other Ambulatory Visit: Payer: Self-pay | Admitting: Cardiovascular Disease

## 2020-03-21 DIAGNOSIS — Z806 Family history of leukemia: Secondary | ICD-10-CM

## 2020-03-21 DIAGNOSIS — K760 Fatty (change of) liver, not elsewhere classified: Secondary | ICD-10-CM

## 2020-03-21 HISTORY — DX: Family history of leukemia: Z80.6

## 2020-03-23 ENCOUNTER — Telehealth: Payer: Self-pay

## 2020-03-23 LAB — H. PYLORI BREATH TEST: H. pylori Breath Test: DETECTED — AB

## 2020-03-23 NOTE — Assessment & Plan Note (Addendum)
Patient had weighed 340lbs in Feb of this year. Lost 43 lbs which I expect to be most likely be related to refractory nausea. In the setting of night sweats however we agreed that consult with hematology for malignancy work up appropriate and also genetic counseling due to family h/o ovarian cancer. Pending ca 125. CT abdomen and pelvis revealed no adnexa mass 10/2019. We agreed to defer ordering further abdominal imaging until she is seen by hematology and gastroenterology. Emphasized the importance of updating preventative screens - colonoscopy, mammogram. Will follow.

## 2020-03-23 NOTE — Assessment & Plan Note (Addendum)
Elevated today however as hasnt taken bisoprolol yet today. Advised to do so when she gets home. Advised to call with me BP, HR readings.

## 2020-03-23 NOTE — Assessment & Plan Note (Signed)
Difficult case.Severe GERD seen on upper GI series.   differential includes PUD.  Alternately consider gastroparesis.  She is off protonix, carafate. Very much advised pursuing colonoscopy and EGD which she plans to discuss at follow up this month.  Pending h pylori , stool studies. Close follow up.

## 2020-03-23 NOTE — Assessment & Plan Note (Addendum)
Severe. trialed nexium and protonix without relief.  H pylori positive and I have consulted with Octavia Bruckner; he stated he will call in quadruple therapy and advise patient on follow. We have left message with patient to discus this as well as make her aware that Cornelia Copa will be calling.

## 2020-03-23 NOTE — Telephone Encounter (Signed)
LMTCB for lab results.  

## 2020-03-23 NOTE — Assessment & Plan Note (Signed)
Controlled. Continue regimen 

## 2020-03-25 ENCOUNTER — Other Ambulatory Visit: Payer: Self-pay

## 2020-03-25 ENCOUNTER — Other Ambulatory Visit (INDEPENDENT_AMBULATORY_CARE_PROVIDER_SITE_OTHER): Payer: Medicare Other

## 2020-03-25 DIAGNOSIS — R110 Nausea: Secondary | ICD-10-CM

## 2020-03-25 DIAGNOSIS — R634 Abnormal weight loss: Secondary | ICD-10-CM | POA: Diagnosis not present

## 2020-03-25 DIAGNOSIS — Z8041 Family history of malignant neoplasm of ovary: Secondary | ICD-10-CM

## 2020-03-25 DIAGNOSIS — I10 Essential (primary) hypertension: Secondary | ICD-10-CM | POA: Diagnosis not present

## 2020-03-25 LAB — COMPREHENSIVE METABOLIC PANEL
ALT: 22 U/L (ref 0–35)
AST: 20 U/L (ref 0–37)
Albumin: 4.1 g/dL (ref 3.5–5.2)
Alkaline Phosphatase: 79 U/L (ref 39–117)
BUN: 19 mg/dL (ref 6–23)
CO2: 27 mEq/L (ref 19–32)
Calcium: 9 mg/dL (ref 8.4–10.5)
Chloride: 100 mEq/L (ref 96–112)
Creatinine, Ser: 0.83 mg/dL (ref 0.40–1.20)
GFR: 68.51 mL/min (ref >60.00)
Glucose, Bld: 82 mg/dL (ref 70–99)
Potassium: 4.4 mEq/L (ref 3.5–5.1)
Sodium: 138 mEq/L (ref 135–145)
Total Bilirubin: 0.7 mg/dL (ref 0.2–1.2)
Total Protein: 6.9 g/dL (ref 6.0–8.3)

## 2020-03-25 LAB — CBC WITH DIFFERENTIAL/PLATELET
Basophils Absolute: 0 10*3/uL (ref 0.0–0.1)
Basophils Relative: 0.8 % (ref 0.0–3.0)
Eosinophils Absolute: 0.1 10*3/uL (ref 0.0–0.7)
Eosinophils Relative: 1.4 % (ref 0.0–5.0)
HCT: 42.4 % (ref 36.0–46.0)
Hemoglobin: 14 g/dL (ref 12.0–15.0)
Lymphocytes Relative: 22.7 % (ref 12.0–46.0)
Lymphs Abs: 1.4 10*3/uL (ref 0.7–4.0)
MCHC: 33 g/dL (ref 30.0–36.0)
MCV: 89.8 fl (ref 78.0–100.0)
Monocytes Absolute: 0.3 10*3/uL (ref 0.1–1.0)
Monocytes Relative: 5.2 % (ref 3.0–12.0)
Neutro Abs: 4.2 10*3/uL (ref 1.4–7.7)
Neutrophils Relative %: 69.9 % (ref 43.0–77.0)
Platelets: 175 10*3/uL (ref 150.0–400.0)
RBC: 4.72 Mil/uL (ref 3.87–5.11)
RDW: 14.4 % (ref 11.5–15.5)
WBC: 6 10*3/uL (ref 4.0–10.5)

## 2020-03-25 LAB — HEMOGLOBIN A1C: Hgb A1c MFr Bld: 5.2 % (ref 4.6–6.5)

## 2020-03-25 LAB — LIPID PANEL
Cholesterol: 190 mg/dL (ref 0–200)
HDL: 69.3 mg/dL (ref >39.00)
LDL Cholesterol: 96 mg/dL (ref 0–99)
NonHDL: 120.51
Total CHOL/HDL Ratio: 3
Triglycerides: 122 mg/dL (ref 0.0–149.0)
VLDL: 24.4 mg/dL (ref 0.0–40.0)

## 2020-03-25 LAB — TSH: TSH: 4.07 u[IU]/mL (ref 0.35–4.50)

## 2020-03-26 ENCOUNTER — Other Ambulatory Visit (INDEPENDENT_AMBULATORY_CARE_PROVIDER_SITE_OTHER): Payer: Medicare Other

## 2020-03-26 ENCOUNTER — Telehealth: Payer: Self-pay

## 2020-03-26 DIAGNOSIS — R110 Nausea: Secondary | ICD-10-CM

## 2020-03-26 LAB — FECAL OCCULT BLOOD, IMMUNOCHEMICAL: Fecal Occult Bld: POSITIVE — AB

## 2020-03-26 LAB — CA 125: CA 125: 6 U/mL (ref <35)

## 2020-03-26 NOTE — Telephone Encounter (Signed)
I spoke with [name] & she is aware. She will address tomorrow as well as reach out to Gerarda Gunther at [facility] with whom she follows.

## 2020-03-26 NOTE — Telephone Encounter (Signed)
CRITICAL VALUE STICKER  CRITICAL VALUE: Positive IFOB  RECEIVER (on-site recipient of call): Gatsby Chismar  DATE & TIME NOTIFIED: 03/26/20;9:40 am  MESSENGER (representative from lab): Cecille Rubin  MD NOTIFIED: Derrel Nip  TIME OF NOTIFICATION:9:40  RESPONSE:

## 2020-03-30 ENCOUNTER — Telehealth: Payer: Self-pay | Admitting: Family

## 2020-03-30 DIAGNOSIS — R110 Nausea: Secondary | ICD-10-CM | POA: Diagnosis not present

## 2020-03-30 DIAGNOSIS — Z23 Encounter for immunization: Secondary | ICD-10-CM

## 2020-03-30 DIAGNOSIS — R634 Abnormal weight loss: Secondary | ICD-10-CM | POA: Diagnosis not present

## 2020-03-30 NOTE — Telephone Encounter (Signed)
They are still working on it and it will be completed soon

## 2020-03-30 NOTE — Telephone Encounter (Signed)
Can you check on status of stool ova and parasites test?

## 2020-03-31 ENCOUNTER — Inpatient Hospital Stay: Payer: Medicare Other | Attending: Oncology | Admitting: Oncology

## 2020-03-31 ENCOUNTER — Other Ambulatory Visit: Payer: Self-pay

## 2020-03-31 ENCOUNTER — Inpatient Hospital Stay: Payer: Medicare Other

## 2020-03-31 ENCOUNTER — Encounter: Payer: Self-pay | Admitting: Oncology

## 2020-03-31 VITALS — BP 151/83 | HR 62 | Temp 96.2°F | Resp 18 | Wt 296.4 lb

## 2020-03-31 DIAGNOSIS — R112 Nausea with vomiting, unspecified: Secondary | ICD-10-CM | POA: Insufficient documentation

## 2020-03-31 DIAGNOSIS — R634 Abnormal weight loss: Secondary | ICD-10-CM | POA: Diagnosis not present

## 2020-03-31 DIAGNOSIS — R61 Generalized hyperhidrosis: Secondary | ICD-10-CM

## 2020-03-31 DIAGNOSIS — Z809 Family history of malignant neoplasm, unspecified: Secondary | ICD-10-CM | POA: Diagnosis not present

## 2020-03-31 DIAGNOSIS — Z800 Family history of malignant neoplasm of digestive organs: Secondary | ICD-10-CM | POA: Diagnosis not present

## 2020-03-31 DIAGNOSIS — K760 Fatty (change of) liver, not elsewhere classified: Secondary | ICD-10-CM | POA: Insufficient documentation

## 2020-03-31 DIAGNOSIS — Z87891 Personal history of nicotine dependence: Secondary | ICD-10-CM | POA: Diagnosis not present

## 2020-03-31 DIAGNOSIS — R6881 Early satiety: Secondary | ICD-10-CM | POA: Diagnosis not present

## 2020-03-31 DIAGNOSIS — Z7901 Long term (current) use of anticoagulants: Secondary | ICD-10-CM | POA: Diagnosis not present

## 2020-03-31 DIAGNOSIS — Z8349 Family history of other endocrine, nutritional and metabolic diseases: Secondary | ICD-10-CM | POA: Diagnosis not present

## 2020-03-31 DIAGNOSIS — I4891 Unspecified atrial fibrillation: Secondary | ICD-10-CM | POA: Insufficient documentation

## 2020-03-31 DIAGNOSIS — I10 Essential (primary) hypertension: Secondary | ICD-10-CM | POA: Insufficient documentation

## 2020-03-31 DIAGNOSIS — Z79899 Other long term (current) drug therapy: Secondary | ICD-10-CM | POA: Diagnosis not present

## 2020-03-31 DIAGNOSIS — Z803 Family history of malignant neoplasm of breast: Secondary | ICD-10-CM | POA: Insufficient documentation

## 2020-03-31 DIAGNOSIS — E785 Hyperlipidemia, unspecified: Secondary | ICD-10-CM | POA: Insufficient documentation

## 2020-03-31 DIAGNOSIS — Z8249 Family history of ischemic heart disease and other diseases of the circulatory system: Secondary | ICD-10-CM | POA: Insufficient documentation

## 2020-03-31 DIAGNOSIS — Z806 Family history of leukemia: Secondary | ICD-10-CM | POA: Insufficient documentation

## 2020-03-31 DIAGNOSIS — Z8041 Family history of malignant neoplasm of ovary: Secondary | ICD-10-CM | POA: Diagnosis present

## 2020-03-31 LAB — CBC WITH DIFFERENTIAL/PLATELET
Abs Immature Granulocytes: 0.01 10*3/uL (ref 0.00–0.07)
Basophils Absolute: 0 10*3/uL (ref 0.0–0.1)
Basophils Relative: 1 %
Eosinophils Absolute: 0.1 10*3/uL (ref 0.0–0.5)
Eosinophils Relative: 1 %
HCT: 42.2 % (ref 36.0–46.0)
Hemoglobin: 14.2 g/dL (ref 12.0–15.0)
Immature Granulocytes: 0 %
Lymphocytes Relative: 24 %
Lymphs Abs: 1.5 10*3/uL (ref 0.7–4.0)
MCH: 30 pg (ref 26.0–34.0)
MCHC: 33.6 g/dL (ref 30.0–36.0)
MCV: 89 fL (ref 80.0–100.0)
Monocytes Absolute: 0.4 10*3/uL (ref 0.1–1.0)
Monocytes Relative: 6 %
Neutro Abs: 4.3 10*3/uL (ref 1.7–7.7)
Neutrophils Relative %: 68 %
Platelets: 188 10*3/uL (ref 150–400)
RBC: 4.74 MIL/uL (ref 3.87–5.11)
RDW: 13.7 % (ref 11.5–15.5)
WBC: 6.2 10*3/uL (ref 4.0–10.5)
nRBC: 0 % (ref 0.0–0.2)

## 2020-03-31 LAB — HEPATITIS PANEL, ACUTE
HCV Ab: NONREACTIVE
Hep A IgM: NONREACTIVE
Hep B C IgM: NONREACTIVE
Hepatitis B Surface Ag: NONREACTIVE

## 2020-03-31 LAB — TECHNOLOGIST SMEAR REVIEW: Plt Morphology: ADEQUATE

## 2020-03-31 LAB — LACTATE DEHYDROGENASE: LDH: 164 U/L (ref 98–192)

## 2020-03-31 NOTE — Progress Notes (Signed)
Pt here to establish care. Patient reports having nausea daily since March 2021. Pt currently on antibiotic for stomach infection.

## 2020-03-31 NOTE — Progress Notes (Signed)
Hematology/Oncology Consult note [facility] [phone] [phone]   Patient Care Team: [name], FNP as PCP - General (Family Medicine) [name] [name], MD as PCP - Cardiology (Cardiology) Charletta Cousin, MD (Inactive) [name], [name], MD as Consulting Physician (Cardiology)  REFERRING PROVIDER: [name], FNP  CHIEF COMPLAINTS/REASON FOR VISIT:  Evaluation of weight loss, family history of ovarian cancer  HISTORY OF PRESENTING ILLNESS:   Amber Barnes is a  69 y.o.  female with PMH listed below was seen in consultation at the request of  [name], FNP  for evaluation of weight loss, family history ovarian cancer. Patient reports a history of nausea vomiting, early satiety, dysphagia symptoms that started in March.  She has lost significant amount of weight as well. Patient weighed 340 pounds in February 2021, 309 pounds in May 2021 and currently she weighs 296 pounds today. Patient has tried Zofran and scopolamine patch which did not relieve her symptoms. 11/05/2019, CT abdomen pelvis showed chronic changes with out acute abnormality to correspond with the patient's symptoms. Patient has had right upper quadrant ultrasound done on 11/14/2019, which showed hepatic steatosis with no other acute abnormality. Patient was seen by gastroenterology Dr. [name] on 11/18/2019.  Patient was recommended to take PPI daily.  Added Carafate.  H. pylori testing came back positive and the patient was started on a course of tetracycline, metronidazole, Pepto-Bismol for H. pylori treatments.  Patient reports that she has been on the treatment for 7 days now.  Nausea is slightly better but still persistent.  She is following up with gastroenterology next week for discussion of EGD. Currently she is not able to tolerate colonoscopy prep due to the nausea.  Her stool was tested positive for occult blood. She also reports night sweating  which is a chronic symptom for her. Patient has a strong family history of leukemia in her mother, colon cancer in her father at age of 25, ovarian cancer in her sister at age of 45, CLL in her brother    Review of Systems  Constitutional: Positive for appetite change, fatigue and unexpected weight change. Negative for chills and fever.  HENT:   Negative for hearing loss and voice change.   Eyes: Negative for eye problems.  Respiratory: Negative for chest tightness and cough.   Cardiovascular: Negative for chest pain.  Gastrointestinal: Negative for abdominal distention, abdominal pain and blood in stool.  Endocrine: Negative for hot flashes.  Genitourinary: Negative for difficulty urinating and frequency.   Musculoskeletal: Negative for arthralgias.  Skin: Negative for itching and rash.  Neurological: Negative for extremity weakness.  Hematological: Negative for adenopathy.  Psychiatric/Behavioral: Negative for confusion.    MEDICAL HISTORY:  Past Medical History:  Diagnosis Date  . Achilles tendinitis   . Acute medial meniscal tear   . Allergy   . Anxiety   . Arthritis   . Atrial fibrillation ([place])    a. new onset 07/2016; b. CHADS2VASc --> 2 (HTN, female); c. eliquis  . CHF (congestive heart failure) ([place])   . Chronic lower back pain   . COPD (chronic obstructive pulmonary disease) ([place])   . Depression   . Fibrocystic breast disease   . GERD (gastroesophageal reflux disease)   . Hyperlipidemia   . Hypertension   . Insomnia   . Lipoma   . Migraine   . Osteoarthritis    left knee  . Osteopenia   . Sleep apnea    does not use  her cpap  . Tendonitis   . Thyroid disease   . Vitamin D deficiency     SURGICAL HISTORY: Past Surgical History:  Procedure Laterality Date  . ABDOMINAL HYSTERECTOMY     ovaries left  . ACHILLES TENDON SURGERY     2012,01/2017  . BACK SURGERY    . CARDIOVERSION N/A 09/07/2016   Procedure: CARDIOVERSION;  Surgeon: Minna Merritts, MD;   Location: [facility] ORS;  Service: Cardiovascular;  Laterality: N/A;  . CARDIOVERSION N/A 10/19/2016   Procedure: CARDIOVERSION;  Surgeon: Minna Merritts, MD;  Location: [facility] ORS;  Service: Cardiovascular;  Laterality: N/A;  . CARDIOVERSION N/A 12/14/2016   Procedure: CARDIOVERSION;  Surgeon: Minna Merritts, MD;  Location: [facility] ORS;  Service: Cardiovascular;  Laterality: N/A;  . COLONOSCOPY    . ESOPHAGOGASTRODUODENOSCOPY    . THYROIDECTOMY     thyroid lobectomy secondary to a benign nodule  . TOTAL HIP ARTHROPLASTY Right 08/21/2018   Procedure: TOTAL HIP ARTHROPLASTY ANTERIOR APPROACH-RIGHT CELL SAVER REQUESTED;  Surgeon: [name], MD;  Location: [facility] ORS;  Service: Orthopedics;  Laterality: Right;    SOCIAL HISTORY: Social History   Socioeconomic History  . Marital status: Married    Spouse name: jeffrey  . Number of children: 1  . Years of education: Not on file  . Highest education level: High school graduate  Occupational History    Comment: disabled  Tobacco Use  . Smoking status: Former Smoker    Packs/day: 0.25    Years: 10.00    Pack years: 2.50    Types: Cigarettes    Quit date: 2005    Years since quitting: 16.7  . Smokeless tobacco: Never Used  . Tobacco comment: smoked for 10 years, 1 pack every 2-3 days  Vaping Use  . Vaping Use: Never used  Substance and Sexual Activity  . Alcohol use: Yes    Comment: social, occasional  . Drug use: No  . Sexual activity: Not Currently  Other Topics Concern  . Not on file  Social History Narrative   Moved to [place] from [place], originally from [place].    1 cup of caffeine daily   Right handed          Social Determinants of Health   Financial Resource Strain:   . Difficulty of Paying Living Expenses: Not on file  Food Insecurity:   . Worried About Charity fundraiser in the Last Year: Not on file  . Ran Out of Food in the Last Year: Not on file  Transportation Needs:   . Lack of  Transportation (Medical): Not on file  . Lack of Transportation (Non-Medical): Not on file  Physical Activity:   . Days of Exercise per Week: Not on file  . Minutes of Exercise per Session: Not on file  Stress:   . Feeling of Stress : Not on file  Social Connections:   . Frequency of Communication with Friends and Family: Not on file  . Frequency of Social Gatherings with Friends and Family: Not on file  . Attends Religious Services: Not on file  . Active Member of Clubs or Organizations: Not on file  . Attends Archivist Meetings: Not on file  . Marital Status: Not on file  Intimate Partner Violence:   . Fear of Current or Ex-Partner: Not on file  . Emotionally Abused: Not on file  . Physically Abused: Not on file  . Sexually Abused: Not on file  FAMILY HISTORY: Family History  Problem Relation Age of Onset  . Cancer Sister 17       ovarian  . Depression Sister   . Cancer Brother        CLL  . Cancer Father 67       colon  . Heart disease Father   . Heart attack Father 97       died from MI  . Hyperlipidemia Father   . Hypertension Father   . Cancer Mother        leukemia  . Breast cancer Paternal Aunt        68's  . Migraines Neg Hx     ALLERGIES:  is allergic to prednisone, hydrocodone-acetaminophen, oxycodone, tramadol, adhesive [tape], and latex.  MEDICATIONS:  Current Outpatient Medications  Medication Sig Dispense Refill  . acetaminophen (TYLENOL) 500 MG tablet Take 500 mg by mouth every 6 (six) hours as needed. Taking as needed    . albuterol (VENTOLIN HFA) 108 (90 Base) MCG/ACT inhaler INHALE 2 PUFFS INTO THE LUNGS EVERY 8 (EIGHT) HOURS AS NEEDED FOR WHEEZING OR SHORTNESS OF BREATH. 6.7 g 0  . ALPRAZolam (XANAX) 0.25 MG tablet TAKE 1 TABLET BY MOUTH DAILY AS NEEDED FOR ANXIETY OR SLEEP. 30 tablet 1  . B Complex Vitamins (VITAMIN B COMPLEX PO) Take 1 tablet by mouth daily.     . bisoprolol (ZEBETA) 5 MG tablet Take 1 tablet (5 mg total) by mouth  as directed. Take one tablet in the AM and may take extra pill in PM if needed for heart rate 180 tablet 3  . calcium gluconate 500 MG tablet Take 500 tablets by mouth daily.    . Cholecalciferol (VITAMIN D3) 5000 units CAPS Take 5,000 Units by mouth daily.    Marland Kitchen ELIQUIS 5 MG TABS tablet TAKE 1 TABLET BY MOUTH TWICE A DAY 60 tablet 6  . esomeprazole (NEXIUM) 20 MG capsule Take 20 mg by mouth 2 (two) times daily.    . furosemide (LASIX) 20 MG tablet TAKE 1 TABLET (20 MG TOTAL) BY MOUTH 2 (TWO) TIMES DAILY. 180 tablet 3  . hydrochlorothiazide (MICROZIDE) 12.5 MG capsule TAKE 1 CAPSULE BY MOUTH EVERY DAY 90 capsule 1  . losartan (COZAAR) 100 MG tablet TAKE 1 TABLET BY MOUTH EVERY DAY 90 tablet 3  . metroNIDAZOLE (FLAGYL) 250 MG tablet Take 250 mg by mouth 4 (four) times daily.    . Multiple Vitamin (MULTIVITAMIN) tablet Take 1 tablet by mouth daily.    Marland Kitchen oxymetazoline (AFRIN) 0.05 % nasal spray Place 1 spray into both nostrils at bedtime as needed for congestion.     . potassium chloride (KLOR-CON) 10 MEQ tablet TAKE 1 TABLET BY MOUTH TWICE A DAY 180 tablet 0  . ranolazine (RANEXA) 500 MG 12 hr tablet TAKE 1 TABLET BY MOUTH TWICE A DAY 180 tablet 0  . rizatriptan (MAXALT) 10 MG tablet TAKE 1 TABLET (10 MG TOTAL) BY MOUTH AS NEEDED FOR MIGRAINE. MAY REPEAT IN 2 HOURS IF NEEDED 10 tablet 0  . rOPINIRole (REQUIP) 0.5 MG tablet Take 1 tablet (0.5 mg total) by mouth every evening. 90 tablet 1  . rosuvastatin (CRESTOR) 20 MG tablet TAKE 1 TABLET BY MOUTH EVERY DAY 90 tablet 0  . sertraline (ZOLOFT) 100 MG tablet TAKE 1 AND 1/2 TABLETS BY MOUTH ONCE DAILY 135 tablet 2  . tetracycline (SUMYCIN) 500 MG capsule Take 500 mg by mouth 4 (four) times daily.     No current facility-administered medications  for this visit.     PHYSICAL EXAMINATION: ECOG PERFORMANCE STATUS: 1 - Symptomatic but completely ambulatory Vitals:   03/31/20 0946  BP: (!) 151/83  Pulse: 62  Resp: 18  Temp: (!) 96.2 F (35.7 C)    Filed Weights   03/31/20 0946  Weight: 296 lb 6.4 oz (134.4 kg)    Physical Exam Constitutional:      General: She is not in acute distress.    Appearance: She is obese.  HENT:     Head: Normocephalic and atraumatic.  Eyes:     General: No scleral icterus. Cardiovascular:     Rate and Rhythm: Normal rate and regular rhythm.     Heart sounds: Normal heart sounds.  Pulmonary:     Effort: Pulmonary effort is normal. No respiratory distress.     Breath sounds: No wheezing.  Abdominal:     General: Bowel sounds are normal. There is no distension.     Palpations: Abdomen is soft.  Musculoskeletal:        General: No deformity. Normal range of motion.     Cervical back: Normal range of motion and neck supple.  Skin:    General: Skin is warm and dry.     Findings: No erythema or rash.  Neurological:     Mental Status: She is alert and oriented to person, place, and time. Mental status is at baseline.     Cranial Nerves: No cranial nerve deficit.     Coordination: Coordination normal.  Psychiatric:        Mood and Affect: Mood normal.     LABORATORY DATA:  I have reviewed the data as listed Lab Results  Component Value Date   WBC 6.2 03/31/2020   HGB 14.2 03/31/2020   HCT 42.2 03/31/2020   MCV 89.0 03/31/2020   PLT 188 03/31/2020   Recent Labs    10/30/19 1238 10/30/19 1238 11/05/19 1436 11/13/19 2229 03/25/20 0802  NA 143   < > 140 138 138  K 3.9   < > 3.6 3.7 4.4  CL 102   < > 102 102 100  CO2 31   < > 29 25 27   GLUCOSE 101*   < > 101* 113* 82  BUN 14   < > 12 18 19   CREATININE 0.95   < > 0.85 0.87 0.83  CALCIUM 9.1   < > 9.0 8.5* 9.0  GFRNONAA >60  --  >60 >60  --   GFRAA >60  --  >60 >60  --   PROT 7.9   < > 7.6 7.5 6.9  ALBUMIN 4.4   < > 4.0 4.0 4.1  AST 39   < > 33 32 20  ALT 34   < > 35 34 22  ALKPHOS 90   < > 88 88 79  BILITOT 0.8   < > 1.0 1.0 0.7   < > = values in this interval not displayed.   Iron/TIBC/Ferritin/ %Sat    Component Value  Date/Time   IRON 105 04/13/2017 0842   FERRITIN 41.8 04/13/2017 0842   IRONPCTSAT 24.3 04/13/2017 0842      RADIOGRAPHIC STUDIES: I have personally reviewed the radiological images as listed and agreed with the findings in the report. No results found.    ASSESSMENT & PLAN:  1. Weight loss   2. Night sweat   3. Family history of cancer   4. Non-intractable vomiting with nausea, unspecified vomiting type    Persistent nausea,  H. pylori gastritis currently on PPI and antibiotic treatments.  Recommend patient to continue follow-up with gastroenterology.  For EGD plus minus colonoscopy work-up. Unintentional weight loss can be due to nausea, decreased oral p.o. intake due to underlying gastropathy. She has CT done on 11/05/2019 which was intimately reviewed by me and agree with radiology interpretation.  CT showed fatty infiltration of the liver, kidney cysts, colon diverticular disease, atherosclerosis, no significant findings to explain her symptoms. Unintentional weight loss, night sweating, etiology unknown, pending GI work-up first I recommend to check CBC, LDH, flow cytometry, smear,  hepatitis panel  Strong family history of cancer, recommend patient to establish care with genetic counselor genetic testing. Orders Placed This Encounter  Procedures  . CBC with Differential/Platelet    Standing Status:   Future    Number of Occurrences:   1    Standing Expiration Date:   03/31/2021  . Flow cytometry panel-leukemia/lymphoma work-up    Standing Status:   Future    Number of Occurrences:   1    Standing Expiration Date:   03/31/2021  . Lactate dehydrogenase    Standing Status:   Future    Number of Occurrences:   1    Standing Expiration Date:   03/31/2021  . JAK2 V617F, w Reflex to CALR/E12/MPL    Standing Status:   Future    Number of Occurrences:   1    Standing Expiration Date:   03/31/2021  . Technologist smear review    Standing Status:   Future    Number of Occurrences:    1    Standing Expiration Date:   03/31/2021  . Hepatitis panel, acute    Standing Status:   Future    Number of Occurrences:   1    Standing Expiration Date:   03/31/2021    All questions were answered. The patient knows to call the clinic with any problems questions or concerns.  cc [name], FNP    Return of visit: 4 weeks Thank you for this kind referral and the opportunity to participate in the care of this patient. A copy of today's note is routed to referring provider    [name], MD, PhD Hematology Oncology [facility] Pager- [phone] 03/31/2020

## 2020-04-02 LAB — COMP PANEL: LEUKEMIA/LYMPHOMA

## 2020-04-05 LAB — TIQ-NTM

## 2020-04-05 LAB — OVA AND PARASITE EXAMINATION
CONCENTRATE RESULT:: NONE SEEN
MICRO NUMBER:: 10964355
SPECIMEN QUALITY:: ADEQUATE
TRICHROME RESULT:: NONE SEEN

## 2020-04-06 DIAGNOSIS — R112 Nausea with vomiting, unspecified: Secondary | ICD-10-CM | POA: Diagnosis not present

## 2020-04-06 DIAGNOSIS — R6881 Early satiety: Secondary | ICD-10-CM | POA: Diagnosis not present

## 2020-04-06 DIAGNOSIS — A048 Other specified bacterial intestinal infections: Secondary | ICD-10-CM | POA: Diagnosis not present

## 2020-04-06 DIAGNOSIS — K219 Gastro-esophageal reflux disease without esophagitis: Secondary | ICD-10-CM | POA: Diagnosis not present

## 2020-04-06 DIAGNOSIS — R195 Other fecal abnormalities: Secondary | ICD-10-CM | POA: Diagnosis not present

## 2020-04-07 ENCOUNTER — Telehealth: Payer: Self-pay | Admitting: Licensed Clinical Social Worker

## 2020-04-07 ENCOUNTER — Telehealth: Payer: Self-pay | Admitting: Cardiovascular Disease

## 2020-04-07 NOTE — Telephone Encounter (Signed)
Please comment on eliquis. 

## 2020-04-07 NOTE — Telephone Encounter (Signed)
Patient was called by administrative assistant Chad Cordial to confirm genetic counseling appointment 9/29 at 9 am. Patient wished to cancel at this time as she does not feel well due to a medication she recently started. She will reschedule the appointment at a later time, declined option to do MyChart video visit.

## 2020-04-07 NOTE — Telephone Encounter (Signed)
Patient with diagnosis of A Fib on Eliquis for anticoagulation.    Procedure: colonscopy and upper endoscopy  Date of procedure: 05/13/20  CHADS2-VASc score of 4 (CHF, HTN, AGE, female)  CrCl 91.3 mL/min using adjusted body weight Platelet count 188K  Per office protocol, patient can hold Eliquis for 2-3 days prior to procedure.   Patient will not need bridging with Lovenox (enoxaparin) around procedure.  If not bridging, patient should restart Eliquis on the evening of procedure or day after, at discretion of procedure MD

## 2020-04-07 NOTE — Telephone Encounter (Signed)
   [facility] Pre-operative Risk Assessment    HEARTCARE STAFF: - Please ensure there is not already an duplicate clearance open for this procedure. - Under Visit Info/Reason for Call, type in Other and utilize the format Clearance MM/DD/YY or Clearance TBD. Do not use dashes or single digits. - If request is for dental extraction, please clarify the # of teeth to be extracted.  Request for surgical clearance:  1. What type of surgery is being performed? colonscopy and upper endoscopy  2. When is this surgery scheduled? 05/13/20  3. What type of clearance is required (medical clearance vs. Pharmacy clearance to hold med vs. Both)? pharm  4. Are there any medications that need to be held prior to surgery and how long? Eliquis 3 days  5. Practice name and name of physician performing surgery? [facility]  6. What is the office phone number? [phone]   7.   What is the office fax number? [phone]  8.   Anesthesia type (None, local, MAC, general) ? monitored   Marykay Lex 04/07/2020, 2:32 PM  _________________________________________________________________   (provider comments below)

## 2020-04-08 ENCOUNTER — Inpatient Hospital Stay: Payer: Medicare Other

## 2020-04-08 ENCOUNTER — Inpatient Hospital Stay: Payer: Medicare Other | Admitting: Licensed Clinical Social Worker

## 2020-04-08 LAB — CALR + JAK2 E12-15 + MPL (REFLEXED)

## 2020-04-08 LAB — JAK2 V617F, W REFLEX TO CALR/E12/MPL

## 2020-04-27 ENCOUNTER — Other Ambulatory Visit: Payer: Self-pay | Admitting: Cardiovascular Disease

## 2020-04-28 ENCOUNTER — Telehealth: Payer: Self-pay

## 2020-04-28 ENCOUNTER — Inpatient Hospital Stay: Payer: Medicare Other | Admitting: Oncology

## 2020-04-28 NOTE — Telephone Encounter (Signed)
Done. Pt is aware of her scheduled 05/20/20 appt @ 2:15

## 2020-04-28 NOTE — Telephone Encounter (Signed)
Called patient for check in of the virtual visit today and she states that she has not had the colonoscopy or endoscopy done yet.  She thought Dr. Tasia Catchings wanted these results prior to next visit with her.  The colonoscopy and endoscopy is scheduled for 05/13/20.  Confirmed with Dr. Tasia Catchings and she would like to get appt today r/s for after testing.  Please r/s appt for today for 2nd week on November and inform patient of appt details.

## 2020-04-30 DIAGNOSIS — A048 Other specified bacterial intestinal infections: Secondary | ICD-10-CM | POA: Diagnosis not present

## 2020-05-06 ENCOUNTER — Encounter: Payer: Self-pay | Admitting: Family

## 2020-05-06 ENCOUNTER — Other Ambulatory Visit: Payer: Self-pay

## 2020-05-06 ENCOUNTER — Telehealth (INDEPENDENT_AMBULATORY_CARE_PROVIDER_SITE_OTHER): Payer: Medicare Other | Admitting: Family

## 2020-05-06 VITALS — Ht 65.0 in | Wt 296.0 lb

## 2020-05-06 DIAGNOSIS — G2581 Restless legs syndrome: Secondary | ICD-10-CM | POA: Diagnosis not present

## 2020-05-06 DIAGNOSIS — I480 Paroxysmal atrial fibrillation: Secondary | ICD-10-CM | POA: Diagnosis not present

## 2020-05-06 DIAGNOSIS — I10 Essential (primary) hypertension: Secondary | ICD-10-CM | POA: Diagnosis not present

## 2020-05-06 DIAGNOSIS — R634 Abnormal weight loss: Secondary | ICD-10-CM

## 2020-05-06 DIAGNOSIS — J3489 Other specified disorders of nose and nasal sinuses: Secondary | ICD-10-CM | POA: Diagnosis not present

## 2020-05-06 DIAGNOSIS — R110 Nausea: Secondary | ICD-10-CM

## 2020-05-06 MED ORDER — ROPINIROLE HCL 0.25 MG PO TABS: 0.2500 mg | ORAL_TABLET | Freq: Every day | ORAL | 1 refills | Status: DC

## 2020-05-06 MED ORDER — AZELASTINE HCL 0.1 % NA SOLN: 1.0000 | Freq: Two times a day (BID) | NASAL | 4 refills | Status: DC

## 2020-05-06 NOTE — Progress Notes (Signed)
Verbal consent for services obtained from patient prior to services given to TELEPHONE visit:   Location of call:  provider at work patient at home  Names of all persons present for services: [name], NP and patient Chief complaint:  Nausea improved.Eating smaller meals with relief.  Non bloody emesis when first waking on empty stomach , occurring 2-3 x per week. Prior had emesis daily.  She wanders if post nasal drip contributory as she reports 'always had a lot of drainage'. No longer on antihistamine. No fever, loss of taste or smell, sob, cough. h/o seasonal allergies. Uses afrin prn.   Gets covid tested in 2 days prior to EGD/ colonoscopy.   No further weightloss.   HTN- at home 125/75. Highest was 135/80. Compliant with losartan, hctz with potassium chloride. NO cp.  Atrial fibrillation- compliant with bisoprolol, eliquis, ranexa  RLS- more aggravating, taking requip 0.5mg  which is no longer as helpful. No numbness in legs.   H pylori - no longer on  nexium 20mg  BID, help to have stool checked.   Colonoscopy and endoscopy scheduled for next week.  She has had cardiac clearance  h pylori stool test negative 04/30/20   Consult with Dr Tasia Catchings whom ordered further lab work. Due for follow up and she plans schedule.   Due mammogram  Wt Readings from Last 3 Encounters:  05/06/20 296 lb (134.3 kg)  03/31/20 296 lb 6.4 oz (134.4 kg)  03/20/20 297 lb (134.7 kg)   BP Readings from Last 3 Encounters:  03/31/20 (!) 151/83  03/20/20 (!) 144/86  12/18/19 (!) 160/80    A/P/next steps:  Problem List Items Addressed This Visit      Cardiovascular and Mediastinum   Atrial fibrillation ([place])    Stable. She follows with cardiology and compliant with bisoprolol, eliquis, ranexa      Hypertension    Controlled based on values at home. Advised to continue to monitor and call with elevations. Continue hctz 12.5mg , potassium chloride 10 meq BID, and losartan 100mg  qd          Other   Nasal drainage    Stop afrin, trial azelastine to see if helpful for congestion and/or associated nausea.       Nausea    Improved. Resolved H.pylori infection. Pending EGD, colonoscopy. Will follow.      Restless leg syndrome    Uncontrolled. Pending ferritin study. Increase requip to 0.75mg  qhs.       Relevant Medications   azelastine (ASTELIN) 0.1 % nasal spray   rOPINIRole (REQUIP) 0.25 MG tablet   Other Relevant Orders   IBC + Ferritin   Weight loss    No further weight loss. Reiterated importance of continued follow up with Dr Tasia Catchings. Will continue to follow.       Other Visit Diagnoses    Restless leg    -  Primary   Relevant Medications   azelastine (ASTELIN) 0.1 % nasal spray       I spent 21 min  discussing plan of care over the phone.

## 2020-05-06 NOTE — Assessment & Plan Note (Addendum)
Improved. Resolved H.pylori infection. Pending EGD, colonoscopy. Will follow.

## 2020-05-06 NOTE — Assessment & Plan Note (Signed)
Stable. She follows with cardiology and compliant with bisoprolol, eliquis, ranexa

## 2020-05-06 NOTE — Assessment & Plan Note (Addendum)
Controlled based on values at home. Advised to continue to monitor and call with elevations. Continue hctz 12.5mg , potassium chloride 10 meq BID, and losartan 100mg  qd

## 2020-05-06 NOTE — Patient Instructions (Addendum)
Increase requip to 0.75 taken at night Trial azelastine nasal spray for congestion and see if helpful for nausea, post nasal drip  Please call  and schedule your 3D mammogram as discussed.   [name]  [place], [place]

## 2020-05-06 NOTE — Assessment & Plan Note (Addendum)
No further weight loss. Reiterated importance of continued follow up with Dr Tasia Catchings. Will continue to follow.

## 2020-05-06 NOTE — Assessment & Plan Note (Signed)
Stop afrin, trial azelastine to see if helpful for congestion and/or associated nausea.

## 2020-05-06 NOTE — Assessment & Plan Note (Signed)
Uncontrolled. Pending ferritin study. Increase requip to 0.75mg  qhs.

## 2020-05-11 ENCOUNTER — Other Ambulatory Visit: Payer: Self-pay

## 2020-05-11 ENCOUNTER — Other Ambulatory Visit
Admission: RE | Admit: 2020-05-11 | Discharge: 2020-05-11 | Disposition: A | Discharge status: Home / Self Care | Payer: Medicare Other | Source: Ambulatory Visit | Attending: Internal Medicine | Admitting: Internal Medicine

## 2020-05-11 DIAGNOSIS — Z20822 Contact with and (suspected) exposure to covid-19: Secondary | ICD-10-CM | POA: Diagnosis not present

## 2020-05-11 DIAGNOSIS — Z01812 Encounter for preprocedural laboratory examination: Secondary | ICD-10-CM | POA: Diagnosis not present

## 2020-05-12 ENCOUNTER — Encounter: Payer: Self-pay | Admitting: Internal Medicine

## 2020-05-12 LAB — SARS CORONAVIRUS 2 (TAT 6-24 HRS): SARS Coronavirus 2: NEGATIVE

## 2020-05-13 ENCOUNTER — Encounter: Payer: Self-pay | Admitting: Internal Medicine

## 2020-05-13 ENCOUNTER — Ambulatory Visit: Payer: Medicare Other | Admitting: Anesthesiology

## 2020-05-13 ENCOUNTER — Encounter
Admission: RE | Disposition: A | Discharge status: Home / Self Care | Payer: Self-pay | Source: Home / Self Care | Attending: Internal Medicine

## 2020-05-13 ENCOUNTER — Other Ambulatory Visit: Payer: Self-pay

## 2020-05-13 ENCOUNTER — Ambulatory Visit
Admission: RE | Admit: 2020-05-13 | Discharge: 2020-05-13 | Disposition: A | Discharge status: Home / Self Care | Payer: Medicare Other | Attending: Internal Medicine | Admitting: Internal Medicine

## 2020-05-13 DIAGNOSIS — K640 First degree hemorrhoids: Secondary | ICD-10-CM | POA: Insufficient documentation

## 2020-05-13 DIAGNOSIS — D120 Benign neoplasm of cecum: Secondary | ICD-10-CM | POA: Insufficient documentation

## 2020-05-13 DIAGNOSIS — Z91040 Latex allergy status: Secondary | ICD-10-CM | POA: Diagnosis not present

## 2020-05-13 DIAGNOSIS — K219 Gastro-esophageal reflux disease without esophagitis: Secondary | ICD-10-CM | POA: Insufficient documentation

## 2020-05-13 DIAGNOSIS — R1310 Dysphagia, unspecified: Secondary | ICD-10-CM | POA: Diagnosis not present

## 2020-05-13 DIAGNOSIS — Z885 Allergy status to narcotic agent status: Secondary | ICD-10-CM | POA: Diagnosis not present

## 2020-05-13 DIAGNOSIS — K3189 Other diseases of stomach and duodenum: Secondary | ICD-10-CM | POA: Diagnosis not present

## 2020-05-13 DIAGNOSIS — K449 Diaphragmatic hernia without obstruction or gangrene: Secondary | ICD-10-CM | POA: Insufficient documentation

## 2020-05-13 DIAGNOSIS — Z888 Allergy status to other drugs, medicaments and biological substances status: Secondary | ICD-10-CM | POA: Diagnosis not present

## 2020-05-13 DIAGNOSIS — D123 Benign neoplasm of transverse colon: Secondary | ICD-10-CM | POA: Diagnosis not present

## 2020-05-13 DIAGNOSIS — K2289 Other specified disease of esophagus: Secondary | ICD-10-CM | POA: Diagnosis not present

## 2020-05-13 DIAGNOSIS — R195 Other fecal abnormalities: Secondary | ICD-10-CM | POA: Insufficient documentation

## 2020-05-13 DIAGNOSIS — K222 Esophageal obstruction: Secondary | ICD-10-CM | POA: Diagnosis not present

## 2020-05-13 DIAGNOSIS — K635 Polyp of colon: Secondary | ICD-10-CM | POA: Diagnosis not present

## 2020-05-13 DIAGNOSIS — K2950 Unspecified chronic gastritis without bleeding: Secondary | ICD-10-CM | POA: Diagnosis not present

## 2020-05-13 DIAGNOSIS — K2960 Other gastritis without bleeding: Secondary | ICD-10-CM | POA: Diagnosis not present

## 2020-05-13 DIAGNOSIS — R634 Abnormal weight loss: Secondary | ICD-10-CM | POA: Diagnosis not present

## 2020-05-13 DIAGNOSIS — K649 Unspecified hemorrhoids: Secondary | ICD-10-CM | POA: Diagnosis not present

## 2020-05-13 HISTORY — DX: Polyp of colon: K63.5

## 2020-05-13 HISTORY — DX: Endometriosis, unspecified: N80.9

## 2020-05-13 HISTORY — PX: COLONOSCOPY WITH PROPOFOL: SHX5780

## 2020-05-13 HISTORY — DX: Cardiac arrhythmia, unspecified: I49.9

## 2020-05-13 HISTORY — DX: Diffuse cystic mastopathy of unspecified breast: N60.19

## 2020-05-13 HISTORY — PX: ESOPHAGOGASTRODUODENOSCOPY (EGD) WITH PROPOFOL: SHX5813

## 2020-05-13 LAB — HM COLONOSCOPY

## 2020-05-13 LAB — SURGICAL PATHOLOGY

## 2020-05-13 SURGERY — COLONOSCOPY WITH PROPOFOL
Anesthesia: General

## 2020-05-13 MED ORDER — PROPOFOL 500 MG/50ML IV EMUL
INTRAVENOUS | Status: AC
  Filled 2020-05-13: qty 50

## 2020-05-13 MED ORDER — LIDOCAINE HCL (CARDIAC) PF 100 MG/5ML IV SOSY
PREFILLED_SYRINGE | INTRAVENOUS | Status: DC | PRN
  Administered 2020-05-13: 100 mg via INTRAVENOUS

## 2020-05-13 MED ORDER — SODIUM CHLORIDE 0.9 % IV SOLN: INTRAVENOUS | Status: DC

## 2020-05-13 MED ORDER — PROPOFOL 10 MG/ML IV BOLUS
INTRAVENOUS | Status: DC | PRN
  Administered 2020-05-13: 60 mg via INTRAVENOUS
  Administered 2020-05-13 (×2): 20 mg via INTRAVENOUS

## 2020-05-13 MED ORDER — PROPOFOL 500 MG/50ML IV EMUL
INTRAVENOUS | Status: DC | PRN
  Administered 2020-05-13: 150 ug/kg/min via INTRAVENOUS

## 2020-05-13 MED ORDER — LIDOCAINE HCL (PF) 2 % IJ SOLN
INTRAMUSCULAR | Status: AC
  Filled 2020-05-13: qty 5

## 2020-05-13 NOTE — Op Note (Addendum)
[facility] Gastroenterology Patient Name: Amber Barnes Procedure Date: 05/13/2020 11:35 AM MRN: [id number] Account #: [id number] Date of Birth: December 23, 1950 Admit Type: Outpatient Age: 69 Room: [facility] ENDO ROOM 2 Gender: Female Note Status: Finalized Procedure:             Upper GI endoscopy Indications:           Dysphagia, Esophageal reflux, Gastro-esophageal reflux                         disease Providers:             Benay Pike. Thang Flett MD, MD Medicines:             Propofol per Anesthesia Complications:         No immediate complications. Procedure:             Pre-Anesthesia Assessment:                        - The risks and benefits of the procedure and the                         sedation options and risks were discussed with the                         patient. All questions were answered and informed                         consent was obtained.                        - Patient identification and proposed procedure were                         verified prior to the procedure by the nurse. The                         procedure was verified in the procedure room.                        - ASA Grade Assessment: III - A patient with severe                         systemic disease.                        - After reviewing the risks and benefits, the patient                         was deemed in satisfactory condition to undergo the                         procedure.                        After obtaining informed consent, the endoscope was                         passed under direct vision. Throughout the procedure,  the patient's blood pressure, pulse, and oxygen                         saturations were monitored continuously. The Endoscope                         was introduced through the mouth, and advanced to the                         third part of duodenum. The upper GI endoscopy was                         accomplished without  difficulty. The patient tolerated                         the procedure well. Findings:      Diffuse mild mucosal variance characterized by punctated white spots and       a decreased vascular pattern was found in the entire esophagus. Biopsies       were obtained from the proximal and distal esophagus with cold forceps       for histology of suspected eosinophilic esophagitis.      One benign-appearing, intrinsic mild stenosis was found at the       gastroesophageal junction. This stenosis measured 1.5 cm (inner       diameter) x less than one cm (in length). The stenosis was traversed.       The scope was withdrawn. Dilation was performed with a Maloney dilator       with no resistance at 75 Fr.      Multiple dispersed liinear erosions with no stigmata of recent bleeding       were found in the gastric antrum. Biopsies were taken with a cold       forceps for GAVE.      A 1 cm hiatal hernia was present.      The examined duodenum was normal.      The exam was otherwise without abnormality. Impression:            - Esophageal mucosal variant. Biopsied.                        - Benign-appearing esophageal stenosis. Dilated.                        - Erosive gastropathy with no stigmata of recent                         bleeding. Biopsied.                        - 1 cm hiatal hernia.                        - Normal examined duodenum.                        - The examination was otherwise normal. Recommendation:        - Await pathology results.                        - Proceed with colonoscopy Procedure Code(s):     ---  Professional ---                        [phone], Esophagogastroduodenoscopy, flexible,                         transoral; with biopsy, single or multiple                        [id number], Dilation of esophagus, by unguided sound or                         bougie, single or multiple passes Diagnosis Code(s):     --- Professional ---                        K21.9,  Gastro-esophageal reflux disease without                         esophagitis                        R13.10, Dysphagia, unspecified                        K44.9, Diaphragmatic hernia without obstruction or                         gangrene                        K31.89, Other diseases of stomach and duodenum                        K22.2, Esophageal obstruction                        K22.8, Other specified diseases of esophagus CPT copyright 2019 American Medical Association. All rights reserved. The codes documented in this report are preliminary and upon coder review may  be revised to meet current compliance requirements. Efrain Sella MD, MD 05/13/2020 12:07:28 PM This report has been signed electronically. Number of Addenda: 0 Note Initiated On: 05/13/2020 11:35 AM Estimated Blood Loss:  Estimated blood loss: none.      [facility]

## 2020-05-13 NOTE — Op Note (Signed)
[facility] Gastroenterology Patient Name: Markell Schrier Procedure Date: 05/13/2020 11:35 AM MRN: [id number] Account #: [id number] Date of Birth: July 18, 1950 Admit Type: Outpatient Age: 69 Room: [facility] ENDO ROOM 2 Gender: Female Note Status: Finalized Procedure:             Colonoscopy Indications:           Heme positive stool Providers:             Benay Pike. Beyounce Dickens MD, MD Medicines:             Propofol per Anesthesia Complications:         No immediate complications. Procedure:             Pre-Anesthesia Assessment:                        - The risks and benefits of the procedure and the                         sedation options and risks were discussed with the                         patient. All questions were answered and informed                         consent was obtained.                        - Patient identification and proposed procedure were                         verified prior to the procedure by the nurse. The                         procedure was verified in the procedure room.                        - ASA Grade Assessment: III - A patient with severe                         systemic disease.                        - After reviewing the risks and benefits, the patient                         was deemed in satisfactory condition to undergo the                         procedure.                        After obtaining informed consent, the colonoscope was                         passed under direct vision. Throughout the procedure,                         the patient's blood pressure, pulse, and oxygen  saturations were monitored continuously. The                         Colonoscope was introduced through the anus and                         advanced to the the cecum, identified by appendiceal                         orifice and ileocecal valve. The colonoscopy was                         performed without difficulty. The  patient tolerated                         the procedure well. The quality of the bowel                         preparation was adequate. The ileocecal valve,                         appendiceal orifice, and rectum were photographed. Findings:      The perianal and digital rectal examinations were normal. Pertinent       negatives include normal sphincter tone and no palpable rectal lesions.      A 5 mm polyp was found in the transverse colon. The polyp was sessile.       The polyp was removed with a jumbo cold forceps. Resection and retrieval       were complete.      A 4 mm polyp was found in the cecum. The polyp was sessile. The polyp       was removed with a jumbo cold forceps. Resection and retrieval were       complete.      Non-bleeding internal hemorrhoids were found during retroflexion. The       hemorrhoids were Grade I (internal hemorrhoids that do not prolapse).      The exam was otherwise without abnormality. Impression:            - One 5 mm polyp in the transverse colon, removed with                         a jumbo cold forceps. Resected and retrieved.                        - One 4 mm polyp in the cecum, removed with a jumbo                         cold forceps. Resected and retrieved.                        - Non-bleeding internal hemorrhoids.                        - The examination was otherwise normal. Recommendation:        - Patient has a contact number available for                         emergencies. The signs and  symptoms of potential                         delayed complications were discussed with the patient.                         Return to normal activities tomorrow. Written                         discharge instructions were provided to the patient.                        - Resume previous diet.                        - Continue present medications.                        - Await pathology results.                        - Repeat colonoscopy in 5 years  for surveillance.                        - Await pathology results from EGD, also performed                         today.                        - Monitor results to esophageal dilation                        - Return to physician assistant in 2 months.                        - The findings and recommendations were discussed with                         the patient. Procedure Code(s):     --- Professional ---                        [phone], Colonoscopy, flexible; with biopsy, single or                         multiple Diagnosis Code(s):     --- Professional ---                        R19.5, Other fecal abnormalities                        K64.0, First degree hemorrhoids                        K63.5, Polyp of colon CPT copyright 2019 American Medical Association. All rights reserved. The codes documented in this report are preliminary and upon coder review may  be revised to meet current compliance requirements. Efrain Sella MD, MD 05/13/2020 12:23:06 PM This report has been signed electronically. Number of Addenda: 0 Note Initiated On: 05/13/2020 11:35 AM Scope Withdrawal Time: 0 hours 6 minutes 49 seconds  Total  Procedure Duration: 0 hours 9 minutes 42 seconds  Estimated Blood Loss:  Estimated blood loss: none.      [facility]

## 2020-05-13 NOTE — H&P (Signed)
Outpatient short stay form Pre-procedure 05/13/2020 11:37 AM Cyndel Griffey K. [name], M.D.  Primary Physician: [name], FNP  Reason for visit:  Nausea, vomiting, early satiety, GERD, Heme positive stool.  History of present illness: Patient with history of CAD on Eliquis has held the anticoagulant for at least 3 days prior to today's scheduled procedures. Patient has intermittent nausea and vomiting, improved but not eliminated after treatment for positive H Pylori breath test. Had hx of dysphagia but UGI series was unremarkable for stricture, mass, web or ring. Patient noted to have heme positive stool.     Current Facility-Administered Medications:  .  0.9 %  sodium chloride infusion, , Intravenous, Continuous, [place], Benay Pike, MD, Last Rate: 20 mL/hr at 05/13/20 1034, New Bag at 05/13/20 1034  Medications Prior to Admission  Medication Sig Dispense Refill Last Dose  . acetaminophen (TYLENOL) 500 MG tablet Take 500 mg by mouth every 6 (six) hours as needed. Taking as needed   Past Month at Unknown time  . albuterol (VENTOLIN HFA) 108 (90 Base) MCG/ACT inhaler INHALE 2 PUFFS INTO THE LUNGS EVERY 8 (EIGHT) HOURS AS NEEDED FOR WHEEZING OR SHORTNESS OF BREATH. 6.7 g 0 Past Month at Unknown time  . ALPRAZolam (XANAX) 0.25 MG tablet TAKE 1 TABLET BY MOUTH DAILY AS NEEDED FOR ANXIETY OR SLEEP. 30 tablet 1 Past Month at Unknown time  . amiodarone (PACERONE) 200 MG tablet Take 200 mg by mouth daily.   05/13/2020 at Unknown time  . azelastine (ASTELIN) 0.1 % nasal spray Place 1 spray into both nostrils 2 (two) times daily. Use in each nostril as directed 30 mL 4 Past Month at Unknown time  . bisoprolol (ZEBETA) 5 MG tablet Take 1 tablet (5 mg total) by mouth as directed. Take one tablet in the AM and may take extra pill in PM if needed for heart rate 180 tablet 3 05/13/2020 at Unknown time  . calcium gluconate 500 MG tablet Take 500 tablets by mouth daily.   Past Week at Unknown time  .  calcium-vitamin D (OSCAL WITH D) 500-200 MG-UNIT tablet Take 1 tablet by mouth daily with breakfast.   Past Week at Unknown time  . Cholecalciferol (VITAMIN D3) 5000 units CAPS Take 5,000 Units by mouth daily.   Past Week at Unknown time  . esomeprazole (NEXIUM) 20 MG capsule Take 20 mg by mouth 2 (two) times daily.   05/12/2020 at Unknown time  . furosemide (LASIX) 20 MG tablet TAKE 1 TABLET (20 MG TOTAL) BY MOUTH 2 (TWO) TIMES DAILY. (Patient taking differently: Take 20 mg by mouth 2 (two) times daily as needed. ) 180 tablet 3 05/12/2020 at Unknown time  . hydrochlorothiazide (MICROZIDE) 12.5 MG capsule TAKE 1 CAPSULE BY MOUTH EVERY DAY 90 capsule 1 05/12/2020 at Unknown time  . losartan (COZAAR) 100 MG tablet TAKE 1 TABLET BY MOUTH EVERY DAY 90 tablet 3 05/12/2020 at Unknown time  . metoCLOPramide (REGLAN) 10 MG tablet Take 10 mg by mouth 4 (four) times daily.   05/13/2020 at Unknown time  . potassium chloride (KLOR-CON) 10 MEQ tablet Take 1 tablet (10 mEq total) by mouth 2 (two) times daily. Please schedule office visit for further refills. Thank you! 60 tablet 0 05/12/2020 at Unknown time  . ranolazine (RANEXA) 500 MG 12 hr tablet TAKE 1 TABLET BY MOUTH TWICE A DAY 180 tablet 0 05/13/2020 at Unknown time  . rOPINIRole (REQUIP) 0.25 MG tablet Take 1 tablet (0.25 mg total) by mouth at bedtime. [place]  tablet 1 05/12/2020 at Unknown time  . rOPINIRole (REQUIP) 0.5 MG tablet Take 1 tablet (0.5 mg total) by mouth every evening. 90 tablet 1 05/12/2020 at Unknown time  . rosuvastatin (CRESTOR) 20 MG tablet TAKE 1 TABLET BY MOUTH EVERY DAY 90 tablet 0 05/13/2020 at Unknown time  . sertraline (ZOLOFT) 100 MG tablet TAKE 1 AND 1/2 TABLETS BY MOUTH ONCE DAILY 135 tablet 2 05/12/2020 at Unknown time  . B Complex Vitamins (VITAMIN B COMPLEX PO) Take 1 tablet by mouth daily.    05/06/2020  . ELIQUIS 5 MG TABS tablet TAKE 1 TABLET BY MOUTH TWICE A DAY 60 tablet 6 05/09/2020  . Multiple Vitamin (MULTIVITAMIN) tablet Take 1  tablet by mouth daily.   05/06/2020  . rizatriptan (MAXALT) 10 MG tablet TAKE 1 TABLET (10 MG TOTAL) BY MOUTH AS NEEDED FOR MIGRAINE. MAY REPEAT IN 2 HOURS IF NEEDED (Patient not taking: Reported on 05/13/2020) 10 tablet 0 Not Taking at Unknown time     Allergies  Allergen Reactions  . Prednisone Other (See Comments)    Agitation, hyperactivity, polyphagia  . Hydrocodone-Acetaminophen Other (See Comments)    Hallucinations and combative  . Oxycodone Itching    Can take it with benadryl  . Tapentadol Other (See Comments)  . Tramadol Itching    Can take it with benadryl  . Adhesive [Tape] Itching and Rash    Prefers paper tape  . Latex Itching and Rash    use paper tap     Past Medical History:  Diagnosis Date  . Achilles tendinitis   . Acute medial meniscal tear   . Allergy   . Anxiety   . Arthritis   . Atrial fibrillation ([place])    a. new onset 07/2016; b. CHADS2VASc --> 2 (HTN, female); c. eliquis  . CHF (congestive heart failure) ([place])   . Chronic lower back pain   . Colon polyp   . COPD (chronic obstructive pulmonary disease) ([place])   . Cystic breast   . Depression   . Dysrhythmia    ATRIAL FIB  . Endometriosis   . Fibrocystic breast disease   . GERD (gastroesophageal reflux disease)   . Hyperlipidemia   . Hypertension   . Insomnia   . Lipoma   . Migraine   . Osteoarthritis    left knee  . Osteopenia   . Sleep apnea    does not use her cpap  . Tendonitis   . Thyroid disease   . Vitamin D deficiency     Review of systems:  Otherwise negative.    Physical Exam  Gen: Alert, oriented. Appears stated age.  HEENT: [place]/AT. PERRLA. Lungs: CTA, no wheezes. CV: RR nl S1, S2. Abd: soft, benign, no masses. BS+ Ext: No edema. Pulses 2+    Planned procedures: Proceed with EGD and colonoscopy. The patient understands the nature of the planned procedure, indications, risks, alternatives and potential complications including but not limited to bleeding, infection,  perforation, damage to internal organs and possible oversedation/side effects from anesthesia. The patient agrees and gives consent to proceed.  Please refer to procedure notes for findings, recommendations and patient disposition/instructions.     Doha Boling K. [name], M.D. Gastroenterology 05/13/2020  11:37 AM

## 2020-05-13 NOTE — Interval H&P Note (Signed)
History and Physical Interval Note:  05/13/2020 11:40 AM  Amber Barnes  has presented today for surgery, with the diagnosis of [place] GERD.  The various methods of treatment have been discussed with the patient and family. After consideration of risks, benefits and other options for treatment, the patient has consented to  Procedure(s) with comments: COLONOSCOPY WITH PROPOFOL (N/A) - ELIQUIS ESOPHAGOGASTRODUODENOSCOPY (EGD) WITH PROPOFOL (N/A) as a surgical intervention.  The patient's history has been reviewed, patient examined, no change in status, stable for surgery.  I have reviewed the patient's chart and labs.  Questions were answered to the patient's satisfaction.     [place], [place]

## 2020-05-13 NOTE — Anesthesia Preprocedure Evaluation (Signed)
Anesthesia Evaluation  Patient identified by MRN, date of birth, ID band Patient awake    Reviewed: Allergy & Precautions, H&P , NPO status , Patient's Chart, lab work & pertinent test results  History of Anesthesia Complications Negative for: history of anesthetic complications  Airway Mallampati: III  TM Distance: <3 FB Neck ROM: limited    Dental  (+) Chipped   Pulmonary sleep apnea and Continuous Positive Airway Pressure Ventilation , COPD, former smoker,    Pulmonary exam normal        Cardiovascular Exercise Tolerance: Good hypertension, (-) angina+CHF  + dysrhythmias Atrial Fibrillation      Neuro/Psych  Headaches, PSYCHIATRIC DISORDERS    GI/Hepatic Neg liver ROS, GERD  Medicated and Controlled,  Endo/Other  negative endocrine ROS  Renal/GU negative Renal ROS  negative genitourinary   Musculoskeletal  (+) Arthritis ,   Abdominal   Peds  Hematology negative hematology ROS (+)   Anesthesia Other Findings Past Medical History: No date: Achilles tendinitis No date: Acute medial meniscal tear No date: Allergy No date: Anxiety No date: Arthritis No date: Atrial fibrillation [facility])     Comment:  a. new onset 07/2016; b. CHADS2VASc --> 2 (HTN, female);               c. eliquis No date: CHF (congestive heart failure) ([place]) No date: Chronic lower back pain No date: Colon polyp No date: COPD (chronic obstructive pulmonary disease) ([facility]) No date: Cystic breast No date: Depression No date: Dysrhythmia     Comment:  ATRIAL FIB No date: Endometriosis No date: Fibrocystic breast disease No date: GERD (gastroesophageal reflux disease) No date: Hyperlipidemia No date: Hypertension No date: Insomnia No date: Lipoma No date: Migraine No date: Osteoarthritis     Comment:  left knee No date: Osteopenia No date: Sleep apnea     Comment:  does not use her cpap No date: Tendonitis No date: Thyroid disease No  date: Vitamin D deficiency  Past Surgical History: No date: ABDOMINAL HYSTERECTOMY     Comment:  ovaries left No date: ACHILLES TENDON SURGERY     Comment:  2012,01/2017 No date: BACK SURGERY 09/07/2016: CARDIOVERSION; N/A     Comment:  Procedure: CARDIOVERSION;  Surgeon: Minna Merritts,               MD;  Location: [facility] ORS;  Service: Cardiovascular;                Laterality: N/A; 10/19/2016: CARDIOVERSION; N/A     Comment:  Procedure: CARDIOVERSION;  Surgeon: Minna Merritts,               MD;  Location: [facility] ORS;  Service: Cardiovascular;                Laterality: N/A; 12/14/2016: CARDIOVERSION; N/A     Comment:  Procedure: CARDIOVERSION;  Surgeon: Minna Merritts,               MD;  Location: [facility] ORS;  Service: Cardiovascular;                Laterality: N/A; No date: COLONOSCOPY No date: ESOPHAGOGASTRODUODENOSCOPY No date: FOOT SURGERY No date: JOINT REPLACEMENT No date: THYROIDECTOMY     Comment:  thyroid lobectomy secondary to a benign nodule 08/21/2018: TOTAL HIP ARTHROPLASTY; Right     Comment:  Procedure: TOTAL HIP ARTHROPLASTY ANTERIOR               APPROACH-RIGHT CELL SAVER REQUESTED;  Surgeon:  [name], MD;  Location: [facility] ORS;  Service: Orthopedics;               Laterality: Right;  BMI    Body Mass Index: 48.26 kg/m      Reproductive/Obstetrics negative OB ROS                             Anesthesia Physical Anesthesia Plan  ASA: III  Anesthesia Plan: General   Post-op Pain Management:    Induction: Intravenous  PONV Risk Score and Plan: Propofol infusion and TIVA  Airway Management Planned: Natural Airway and Nasal Cannula  Additional Equipment:   Intra-op Plan:   Post-operative Plan:   Informed Consent: I have reviewed the patients History and Physical, chart, labs and discussed the procedure including the risks, benefits and alternatives for the proposed anesthesia with the patient or authorized  representative who has indicated his/her understanding and acceptance.     Dental Advisory Given  Plan Discussed with: Anesthesiologist, CRNA and Surgeon  Anesthesia Plan Comments: (Patient consented for risks of anesthesia including but not limited to:  - adverse reactions to medications - risk of intubation if required - damage to eyes, teeth, lips or other oral mucosa - nerve damage due to positioning  - sore throat or hoarseness - Damage to heart, brain, nerves, lungs, other parts of body or loss of life  Patient voiced understanding.)        Anesthesia Quick Evaluation

## 2020-05-13 NOTE — Transfer of Care (Signed)
Immediate Anesthesia Transfer of Care Note  Patient: Amber Barnes  Procedure(s) Performed: COLONOSCOPY WITH PROPOFOL (N/A ) ESOPHAGOGASTRODUODENOSCOPY (EGD) WITH PROPOFOL (N/A )  Patient Location: PACU and Endoscopy Unit  Anesthesia Type:General  Level of Consciousness: awake, drowsy and patient cooperative  Airway & Oxygen Therapy: Patient Spontanous Breathing  Post-op Assessment: Report given to RN and Post -op Vital signs reviewed and stable  Post vital signs: Reviewed and stable  Last Vitals:  Vitals Value Taken Time  BP    Temp    Pulse 64 05/13/20 1227  Resp 16 05/13/20 1227  SpO2 99 % 05/13/20 1227    Last Pain:  Vitals:   05/13/20 1014  TempSrc: Temporal  PainSc: 0-No pain         Complications: No complications documented.

## 2020-05-14 ENCOUNTER — Encounter: Payer: Self-pay | Admitting: Internal Medicine

## 2020-05-14 ENCOUNTER — Other Ambulatory Visit: Payer: Self-pay | Admitting: Family

## 2020-05-14 DIAGNOSIS — G2581 Restless legs syndrome: Secondary | ICD-10-CM

## 2020-05-14 NOTE — Anesthesia Postprocedure Evaluation (Signed)
Anesthesia Post Note  Patient: Amber Barnes  Procedure(s) Performed: COLONOSCOPY WITH PROPOFOL (N/A ) ESOPHAGOGASTRODUODENOSCOPY (EGD) WITH PROPOFOL (N/A )  Patient location during evaluation: Endoscopy Anesthesia Type: General Level of consciousness: awake and alert Pain management: pain level controlled Vital Signs Assessment: post-procedure vital signs reviewed and stable Respiratory status: spontaneous breathing, nonlabored ventilation, respiratory function stable and patient connected to nasal cannula oxygen Cardiovascular status: blood pressure returned to baseline and stable Postop Assessment: no apparent nausea or vomiting Anesthetic complications: no   No complications documented.   Last Vitals:  Vitals:   05/13/20 1237 05/13/20 1247  BP: (!) 153/65 138/67  Pulse: (!) 49 (!) 51  Resp: 18 17  Temp:    SpO2: 98% 100%    Last Pain:  Vitals:   05/14/20 0735  TempSrc:   PainSc: 0-No pain                 Precious Haws Cashay Manganelli

## 2020-05-20 ENCOUNTER — Encounter: Payer: Self-pay | Admitting: Family

## 2020-05-20 ENCOUNTER — Encounter: Payer: Self-pay | Admitting: Oncology

## 2020-05-20 ENCOUNTER — Inpatient Hospital Stay: Payer: Medicare Other | Attending: Oncology | Admitting: Oncology

## 2020-05-20 ENCOUNTER — Other Ambulatory Visit: Payer: Self-pay

## 2020-05-20 ENCOUNTER — Ambulatory Visit: Payer: Medicare Other | Admitting: Family

## 2020-05-20 VITALS — BP 146/96 | HR 58 | Ht 65.0 in | Wt 290.0 lb

## 2020-05-20 DIAGNOSIS — Z809 Family history of malignant neoplasm, unspecified: Secondary | ICD-10-CM | POA: Diagnosis not present

## 2020-05-20 DIAGNOSIS — Z7901 Long term (current) use of anticoagulants: Secondary | ICD-10-CM

## 2020-05-20 DIAGNOSIS — R634 Abnormal weight loss: Secondary | ICD-10-CM

## 2020-05-20 DIAGNOSIS — I480 Paroxysmal atrial fibrillation: Secondary | ICD-10-CM

## 2020-05-20 DIAGNOSIS — R112 Nausea with vomiting, unspecified: Secondary | ICD-10-CM | POA: Diagnosis not present

## 2020-05-20 DIAGNOSIS — I10 Essential (primary) hypertension: Secondary | ICD-10-CM | POA: Diagnosis not present

## 2020-05-20 DIAGNOSIS — E782 Mixed hyperlipidemia: Secondary | ICD-10-CM

## 2020-05-20 NOTE — Progress Notes (Signed)
HEMATOLOGY-ONCOLOGY TeleHEALTH VISIT PROGRESS NOTE  I connected with Amber Barnes on 05/20/20 at  2:15 PM EST by video enabled telemedicine visit and verified that I am speaking with the correct person using two identifiers. I discussed the limitations, risks, security and privacy concerns of performing an evaluation and management service by telemedicine and the availability of in-person appointments. I also discussed with the patient that there may be a patient responsible charge related to this service. The patient expressed understanding and agreed to proceed.   Other persons participating in the visit and their role in the encounter:  None  Patient's location: Home  Provider's location: office Chief Complaint: unintentional weight loss, family history of ovarian cancer   INTERVAL HISTORY Amber Barnes is a 69 y.o. female who has above history reviewed by me today presents for follow up visit for weight loss.  family history of ovarian cancer Problems and complaints are listed below:  I attempted to connect the patient for visual enabled telehealth visit.  Due to the technical difficulties with video,  Patient was transitioned to audio only visit. She continues to have nausea and stomach discomfort. She has had GI work up during the interval.  05/15/2020 EGD showed erosive gastropathy, hiatal hernia. Biopsy is negative for H pylori, dysplasia or malignancy 05/13/2020 colonoscopy  Showed polyps, pathology showed  Review of Systems  Constitutional: Negative for appetite change, chills, fatigue and fever.  HENT:   Negative for hearing loss and voice change.   Eyes: Negative for eye problems.  Respiratory: Negative for chest tightness and cough.   Cardiovascular: Negative for chest pain.  Gastrointestinal: Positive for nausea. Negative for abdominal distention, abdominal pain and blood in stool.       Epigastric discomfort.   Endocrine: Negative for hot flashes.  Genitourinary:  Negative for difficulty urinating and frequency.   Musculoskeletal: Negative for arthralgias.  Skin: Negative for itching and rash.  Neurological: Negative for extremity weakness.  Hematological: Negative for adenopathy.  Psychiatric/Behavioral: Negative for confusion.    Past Medical History:  Diagnosis Date  . Achilles tendinitis   . Acute medial meniscal tear   . Allergy   . Anxiety   . Arthritis   . Atrial fibrillation ([place])    a. new onset 07/2016; b. CHADS2VASc --> 2 (HTN, female); c. eliquis  . CHF (congestive heart failure) ([place])   . Chronic lower back pain   . Colon polyp   . COPD (chronic obstructive pulmonary disease) ([place])   . Cystic breast   . Depression   . Dysrhythmia    ATRIAL FIB  . Endometriosis   . Fibrocystic breast disease   . GERD (gastroesophageal reflux disease)   . Hyperlipidemia   . Hypertension   . Insomnia   . Lipoma   . Migraine   . Osteoarthritis    left knee  . Osteopenia   . Sleep apnea    does not use her cpap  . Tendonitis   . Thyroid disease   . Vitamin D deficiency    Past Surgical History:  Procedure Laterality Date  . ABDOMINAL HYSTERECTOMY     ovaries left  . ACHILLES TENDON SURGERY     2012,01/2017  . BACK SURGERY    . CARDIOVERSION N/A 09/07/2016   Procedure: CARDIOVERSION;  Surgeon: Amber Merritts, MD;  Location: [facility];  Service: Cardiovascular;  Laterality: N/A;  . CARDIOVERSION N/A 10/19/2016   Procedure: CARDIOVERSION;  Surgeon: Amber Merritts, MD;  Location: [facility];  Service: Cardiovascular;  Laterality: N/A;  . CARDIOVERSION N/A 12/14/2016   Procedure: CARDIOVERSION;  Surgeon: Amber Merritts, MD;  Location: [facility];  Service: Cardiovascular;  Laterality: N/A;  . COLONOSCOPY    . COLONOSCOPY WITH PROPOFOL N/A 05/13/2020   Procedure: COLONOSCOPY WITH PROPOFOL;  Surgeon: Barnes, Amber Pike, MD;  Location: [facility];  Service: Gastroenterology;  Laterality: N/A;  ELIQUIS  . ESOPHAGOGASTRODUODENOSCOPY    .  ESOPHAGOGASTRODUODENOSCOPY (EGD) WITH PROPOFOL N/A 05/13/2020   Procedure: ESOPHAGOGASTRODUODENOSCOPY (EGD) WITH PROPOFOL;  Surgeon: Barnes, Amber Pike, MD;  Location: [facility];  Service: Gastroenterology;  Laterality: N/A;  . FOOT SURGERY    . JOINT REPLACEMENT    . THYROIDECTOMY     thyroid lobectomy secondary to a benign nodule  . TOTAL HIP ARTHROPLASTY Right 08/21/2018   Procedure: TOTAL HIP ARTHROPLASTY ANTERIOR APPROACH-RIGHT CELL SAVER REQUESTED;  Surgeon: [name], MD;  Location: [facility];  Service: Orthopedics;  Laterality: Right;    Family History  Problem Relation Age of Onset  . Cancer Sister 79       ovarian  . Depression Sister   . Cancer Brother        CLL  . Cancer Father 7       colon  . Heart disease Father   . Heart attack Father 66       died from MI  . Hyperlipidemia Father   . Hypertension Father   . Cancer Mother        leukemia  . Breast cancer Paternal Aunt        69's  . Migraines Neg Hx     Social History   Socioeconomic History  . Marital status: Married    Spouse name: Amber Barnes  . Number of children: 1  . Years of education: Not on file  . Highest education level: High school graduate  Occupational History    Comment: disabled  Tobacco Use  . Smoking status: Former Smoker    Packs/day: 0.25    Years: 10.00    Pack years: 2.50    Types: Cigarettes    Quit date: 2005    Years since quitting: 16.8  . Smokeless tobacco: Never Used  . Tobacco comment: smoked for 10 years, 1 pack every 2-3 days  Vaping Use  . Vaping Use: Never used  Substance and Sexual Activity  . Alcohol use: Not Currently    Comment: social, occasional  . Drug use: No  . Sexual activity: Not Currently  Other Topics Concern  . Not on file  Social History Narrative   Moved to [place] from [place], originally from [place].    1 cup of caffeine daily   Right handed          Social Determinants of Health   Financial Resource Strain:    . Difficulty of Paying Living Expenses: Not on file  Food Insecurity:   . Worried About Charity fundraiser in the Last Year: Not on file  . Ran Out of Food in the Last Year: Not on file  Transportation Needs:   . Lack of Transportation (Medical): Not on file  . Lack of Transportation (Non-Medical): Not on file  Physical Activity:   . Days of Exercise per Week: Not on file  . Minutes of Exercise per Session: Not on file  Stress:   . Feeling of Stress : Not on file  Social Connections:   . Frequency of Communication with Friends and Family: Not on file  . Frequency  of Social Gatherings with Friends and Family: Not on file  . Attends Religious Services: Not on file  . Active Member of Clubs or Organizations: Not on file  . Attends Archivist Meetings: Not on file  . Marital Status: Not on file  Intimate Partner Violence:   . Fear of Current or Ex-Partner: Not on file  . Emotionally Abused: Not on file  . Physically Abused: Not on file  . Sexually Abused: Not on file    Current Outpatient Medications on File Prior to Visit  Medication Sig Dispense Refill  . acetaminophen (TYLENOL) 500 MG tablet Take 500 mg by mouth every 6 (six) hours as needed. Taking as needed    . albuterol (VENTOLIN HFA) 108 (90 Base) MCG/ACT inhaler INHALE 2 PUFFS INTO THE LUNGS EVERY 8 (EIGHT) HOURS AS NEEDED FOR WHEEZING OR SHORTNESS OF BREATH. 6.7 g 0  . ALPRAZolam (XANAX) 0.25 MG tablet TAKE 1 TABLET BY MOUTH DAILY AS NEEDED FOR ANXIETY OR SLEEP. 30 tablet 1  . azelastine (ASTELIN) 0.1 % nasal spray Place 1 spray into both nostrils 2 (two) times daily. Use in each nostril as directed 30 mL 4  . B Complex Vitamins (VITAMIN B COMPLEX PO) Take 1 tablet by mouth daily.     . bisoprolol (ZEBETA) 5 MG tablet Take 1 tablet (5 mg total) by mouth as directed. Take one tablet in the AM and may take extra pill in PM if needed for heart rate 180 tablet 3  . calcium gluconate 500 MG tablet Take 500 tablets by  mouth daily.    . calcium-vitamin D (OSCAL WITH D) 500-200 MG-UNIT tablet Take 1 tablet by mouth daily with breakfast.    . Cholecalciferol (VITAMIN D3) 5000 units CAPS Take 5,000 Units by mouth daily.    Marland Kitchen ELIQUIS 5 MG TABS tablet TAKE 1 TABLET BY MOUTH TWICE A DAY 60 tablet 6  . esomeprazole (NEXIUM) 20 MG capsule Take 20 mg by mouth 2 (two) times daily.    . furosemide (LASIX) 20 MG tablet TAKE 1 TABLET (20 MG TOTAL) BY MOUTH 2 (TWO) TIMES DAILY. (Patient taking differently: Take 20 mg by mouth 2 (two) times daily as needed. ) 180 tablet 3  . hydrochlorothiazide (MICROZIDE) 12.5 MG capsule TAKE 1 CAPSULE BY MOUTH EVERY DAY 90 capsule 1  . losartan (COZAAR) 100 MG tablet TAKE 1 TABLET BY MOUTH EVERY DAY 90 tablet 3  . Multiple Vitamin (MULTIVITAMIN) tablet Take 1 tablet by mouth daily.    . potassium chloride (KLOR-CON) 10 MEQ tablet Take 1 tablet (10 mEq total) by mouth 2 (two) times daily. Please schedule office visit for further refills. Thank you! 60 tablet 0  . ranolazine (RANEXA) 500 MG 12 hr tablet TAKE 1 TABLET BY MOUTH TWICE A DAY 180 tablet 0  . rizatriptan (MAXALT) 10 MG tablet TAKE 1 TABLET (10 MG TOTAL) BY MOUTH AS NEEDED FOR MIGRAINE. MAY REPEAT IN 2 HOURS IF NEEDED 10 tablet 0  . rOPINIRole (REQUIP) 0.25 MG tablet Take 1 tablet (0.25 mg total) by mouth at bedtime. 90 tablet 1  . rOPINIRole (REQUIP) 0.5 MG tablet TAKE 1 TABLET (0.5 MG TOTAL) BY MOUTH EVERY EVENING. 90 tablet 1  . rosuvastatin (CRESTOR) 20 MG tablet TAKE 1 TABLET BY MOUTH EVERY DAY 90 tablet 0  . sertraline (ZOLOFT) 100 MG tablet TAKE 1 AND 1/2 TABLETS BY MOUTH ONCE DAILY 135 tablet 2   No current facility-administered medications on file prior to visit.  Allergies  Allergen Reactions  . Prednisone Other (See Comments)    Agitation, hyperactivity, polyphagia  . Hydrocodone-Acetaminophen Other (See Comments)    Hallucinations and combative  . Oxycodone Itching    Can take it with benadryl  . Tapentadol  Other (See Comments)  . Tramadol Itching    Can take it with benadryl  . Adhesive [Tape] Itching and Rash    Prefers paper tape  . Latex Itching and Rash    use paper tap       Observations/Objective: Today's Vitals   05/20/20 1326  PainSc: 0-No pain   There is no height or weight on file to calculate BMI.  Physical Exam  CBC    Component Value Date/Time   WBC 6.2 03/31/2020 1051   RBC 4.74 03/31/2020 1051   HGB 14.2 03/31/2020 1051   HGB 12.9 10/11/2016 0843   HCT 42.2 03/31/2020 1051   HCT 37.7 10/11/2016 0843   PLT 188 03/31/2020 1051   PLT 227 10/11/2016 0843   MCV 89.0 03/31/2020 1051   MCV 86 10/11/2016 0843   MCH 30.0 03/31/2020 1051   MCHC 33.6 03/31/2020 1051   RDW 13.7 03/31/2020 1051   RDW 14.8 10/11/2016 0843   LYMPHSABS 1.5 03/31/2020 1051   LYMPHSABS 1.6 10/11/2016 0843   MONOABS 0.4 03/31/2020 1051   EOSABS 0.1 03/31/2020 1051   EOSABS 0.1 10/11/2016 0843   BASOSABS 0.0 03/31/2020 1051   BASOSABS 0.0 10/11/2016 0843    CMP     Component Value Date/Time   NA 138 03/25/2020 0802   NA 143 02/27/2018 1520   K 4.4 03/25/2020 0802   CL 100 03/25/2020 0802   CO2 27 03/25/2020 0802   GLUCOSE 82 03/25/2020 0802   BUN 19 03/25/2020 0802   BUN 17 02/27/2018 1520   CREATININE 0.83 03/25/2020 0802   CREATININE 0.65 05/14/2013 0851   CALCIUM 9.0 03/25/2020 0802   PROT 6.9 03/25/2020 0802   PROT 7.0 02/27/2018 1520   ALBUMIN 4.1 03/25/2020 0802   ALBUMIN 4.4 02/27/2018 1520   AST 20 03/25/2020 0802   ALT 22 03/25/2020 0802   ALKPHOS 79 03/25/2020 0802   BILITOT 0.7 03/25/2020 0802   BILITOT 0.4 02/27/2018 1520   GFRNONAA >60 11/13/2019 2229   GFRAA >60 11/13/2019 2229     Assessment and Plan: 1. Weight loss   2. Family history of cancer   3. Non-intractable vomiting with nausea, unspecified vomiting type     # Weight loss, vomiting, epigastric discomfort are likely due to her erosive gastropathy.  Continue Nexium. Recommend patient to follow  up with PCP and gastroenterology.   # Family history of ovarian cancer, I have referred her to genetic counselor.  Per note, patient was called by genetic counselor and offered an appointment and she cancelled the appointment.   I will discharge patient as her symptoms maybe explained by her gastropathy and she feels better at this point. She agrees with the plan.     I discussed the assessment and treatment plan with the patient. The patient was provided an opportunity to ask questions and all were answered. The patient agreed with the plan and demonstrated an understanding of the instructions.  The patient was advised to call back or seek an in-person evaluation if the symptoms worsen or if the condition fails to improve as anticipated.    [name], MD 05/20/2020 6:34 PM

## 2020-05-20 NOTE — Patient Instructions (Addendum)
Medication Instructions:  No medication changes today.   *If you need a refill on your cardiac medications before your next appointment, please call your pharmacy*   Lab Work: None ordered today.   Testing/Procedures: Your EKG today showed sinus bradycardia which is a stable finding.    Follow-Up: At [facility], you and your health needs are our priority.  As part of our continuing mission to provide you with exceptional heart care, we have created designated Provider Care Teams.  These Care Teams include your primary Cardiologist (physician) and Advanced Practice Providers (APPs -  Physician Assistants and Nurse Practitioners) who all work together to provide you with the care you need, when you need it.  We recommend signing up for the patient portal called "MyChart".  Sign up information is provided on this After Visit Summary.  MyChart is used to connect with patients for Virtual Visits (Telemedicine).  Patients are able to view lab/test results, encounter notes, upcoming appointments, etc.  Non-urgent messages can be sent to your provider as well.   To learn more about what you can do with MyChart, go to [email or web address].    Your next appointment:   6 month(s)  The format for your next appointment:   In Person  Provider:   You may see Ida Rogue, MD or one of the following Advanced Practice Providers on your designated Care Team:    Murray Hodgkins, NP  Christell Faith, PA-C  Laurann Montana, NP  Marrianne Mood, PA-C  Cadence Kathlen Mody, [place]  Other Instructions  If your blood pressure is consistently more than 130/80 when you check your blood pressure two hours after your medications, please let us know and we can consider increasing your dose of Hydrochlorothiazide.

## 2020-05-20 NOTE — Progress Notes (Signed)
Patient contacted for Mychart visit. Patient reports that she had been having nausea and was constantly sick on her stomach, but has gone through GI workup and was diagnosed with GERD.

## 2020-05-20 NOTE — Progress Notes (Signed)
Office Visit    Patient Name: Amber Barnes Date of Encounter: 05/20/2020  Primary Care Provider:  [name], FNP Primary Cardiologist:  Ida Rogue, MD Electrophysiologist:  None   Chief Complaint    Amber Barnes is a 69 y.o. female with a hx of PAF, obesity, arthritis, HLD, HTN, OSA on CPAP, migraines, anxiety  presents today for follow-up of PAF and HTN  Past Medical History    Past Medical History:  Diagnosis Date  . Achilles tendinitis   . Acute medial meniscal tear   . Allergy   . Anxiety   . Arthritis   . Atrial fibrillation ([place])    a. new onset 07/2016; b. CHADS2VASc --> 2 (HTN, female); c. eliquis  . CHF (congestive heart failure) ([place])   . Chronic lower back pain   . Colon polyp   . COPD (chronic obstructive pulmonary disease) ([place])   . Cystic breast   . Depression   . Dysrhythmia    ATRIAL FIB  . Endometriosis   . Fibrocystic breast disease   . GERD (gastroesophageal reflux disease)   . Hyperlipidemia   . Hypertension   . Insomnia   . Lipoma   . Migraine   . Osteoarthritis    left knee  . Osteopenia   . Sleep apnea    does not use her cpap  . Tendonitis   . Thyroid disease   . Vitamin D deficiency    Past Surgical History:  Procedure Laterality Date  . ABDOMINAL HYSTERECTOMY     ovaries left  . ACHILLES TENDON SURGERY     2012,01/2017  . BACK SURGERY    . CARDIOVERSION N/A 09/07/2016   Procedure: CARDIOVERSION;  Surgeon: Minna Merritts, MD;  Location: [facility];  Service: Cardiovascular;  Laterality: N/A;  . CARDIOVERSION N/A 10/19/2016   Procedure: CARDIOVERSION;  Surgeon: Minna Merritts, MD;  Location: [facility];  Service: Cardiovascular;  Laterality: N/A;  . CARDIOVERSION N/A 12/14/2016   Procedure: CARDIOVERSION;  Surgeon: Minna Merritts, MD;  Location: [facility];  Service: Cardiovascular;  Laterality: N/A;  . COLONOSCOPY    . COLONOSCOPY WITH PROPOFOL N/A 05/13/2020   Procedure: COLONOSCOPY WITH PROPOFOL;  Surgeon:  Toledo, Benay Pike, MD;  Location: [facility];  Service: Gastroenterology;  Laterality: N/A;  ELIQUIS  . ESOPHAGOGASTRODUODENOSCOPY    . ESOPHAGOGASTRODUODENOSCOPY (EGD) WITH PROPOFOL N/A 05/13/2020   Procedure: ESOPHAGOGASTRODUODENOSCOPY (EGD) WITH PROPOFOL;  Surgeon: Toledo, Benay Pike, MD;  Location: [facility];  Service: Gastroenterology;  Laterality: N/A;  . FOOT SURGERY    . JOINT REPLACEMENT    . THYROIDECTOMY     thyroid lobectomy secondary to a benign nodule  . TOTAL HIP ARTHROPLASTY Right 08/21/2018   Procedure: TOTAL HIP ARTHROPLASTY ANTERIOR APPROACH-RIGHT CELL SAVER REQUESTED;  Surgeon: [name], MD;  Location: [facility];  Service: Orthopedics;  Laterality: Right;   Allergies  Allergies  Allergen Reactions  . Prednisone Other (See Comments)    Agitation, hyperactivity, polyphagia  . Hydrocodone-Acetaminophen Other (See Comments)    Hallucinations and combative  . Oxycodone Itching    Can take it with benadryl  . Tapentadol Other (See Comments)  . Tramadol Itching    Can take it with benadryl  . Adhesive [Tape] Itching and Rash    Prefers paper tape  . Latex Itching and Rash    use paper tap   History of Present Illness    Amber Barnes is a 70 y.o. female with a hx of PAF, obesity, arthritis,  HLD, HTN, OSA on CPAP, migraines, anxiety. She was last seen 11/12/19 by Dr. [name].  Her PAF is s/p cardioversion 08/2016. Stress test January 2018 with no ischemia, LVEF 55-65%. 09/14/19 she reported SBP at home consistently 145. She was recommended to start HCTZ 12.5mg  daily.   Seen in the ED 11/05/19. She presented from her PCP with intractable nausea, chest pain with palpitations, and epigastric abdominal pain. Noted to be in atrial fibrillation.  Cardiac enzymes were negative x2. There was concern that her amiodarone was contributory and it was discontinued. ED provider discussed with Dr. Saunders Revel who recommended increasing Zebeta to 5mg  for rate control.   In follow up  11/06/19 she was still in atrial fibrillation though noted missed doses of Eliquis due to previous nausea and cardioversion was deferred. When seen in follow up 11/12/19 she had converted to NSR. She was recommended to continue current medications and use extra Bisoprolol as needed for breakthrough atrial fibrillation  Since last seen she has established with GI. She was diagnosed and treated for h.pylori. She had endoscopy and colonoscopy 05/13/20.  She tells me she was diagnosed with gastritis.  She has had recurrent bouts of nausea, vomiting which is which she attributes to her weight loss to.  Since last seen 6 months ago she has lost 22 pounds.  Presents today for follow-up.  Her daughter is also present at time of visit.  She reports her afternoon blood pressures are routinely 125-135/70-80.  However she notices elevated blood pressures in the mornings in the 140s over 90s.  We discussed that this is likely as her medications are not yet in her system and encouraged to continue to monitor in the afternoon.  Reports no chest pain, pressure, tightness.  Reports no shortness of breath at rest and endorses her dyspnea on exertion is stable at her baseline.  Tells me her lower extremity edema is well controlled and she is taking her Lasix daily in the evening.  EKGs/Labs/Other Studies Reviewed:   The following studies were reviewed today:  Echo 07/2016 Left ventricle: The cavity size was normal. There was mild    concentric hypertrophy. Systolic function was normal. The    estimated ejection fraction was in the range of 55% to 60%. Wall    motion was normal; there were no regional wall motion    abnormalities. The study is not technically sufficient to allow    evaluation of LV diastolic function.  - Mitral valve: There was mild regurgitation.  - Left atrium: The atrium was mildly dilated.  - Right ventricle: Systolic function was normal.  - Pulmonary arteries: Systolic pressure could not be  accurately    estimated.   EKG:  EKG is ordered today.  The ekg ordered today demonstrates sinus bradycardia 58 bpm with voltage criteria for LVH and stable T wave inversion in lead V1 through V3 and no acute ST/T wave changes.    Recent Labs: 11/05/2019: B Natriuretic Peptide 162.0; Magnesium 1.8 03/25/2020: ALT 22; BUN 19; Creatinine, Ser 0.83; Potassium 4.4; Sodium 138; TSH 4.07 03/31/2020: Hemoglobin 14.2; Platelets 188  Recent Lipid Panel    Component Value Date/Time   CHOL 190 03/25/2020 0802   TRIG 122.0 03/25/2020 0802   HDL 69.30 03/25/2020 0802   CHOLHDL 3 03/25/2020 0802   VLDL 24.4 03/25/2020 0802   LDLCALC 96 03/25/2020 0802    Home Medications   Current Meds  Medication Sig  . acetaminophen (TYLENOL) 500 MG tablet Take 500 mg by mouth every  6 (six) hours as needed. Taking as needed  . albuterol (VENTOLIN HFA) 108 (90 Base) MCG/ACT inhaler INHALE 2 PUFFS INTO THE LUNGS EVERY 8 (EIGHT) HOURS AS NEEDED FOR WHEEZING OR SHORTNESS OF BREATH.  Marland Kitchen ALPRAZolam (XANAX) 0.25 MG tablet TAKE 1 TABLET BY MOUTH DAILY AS NEEDED FOR ANXIETY OR SLEEP.  Marland Kitchen azelastine (ASTELIN) 0.1 % nasal spray Place 1 spray into both nostrils 2 (two) times daily. Use in each nostril as directed  . B Complex Vitamins (VITAMIN B COMPLEX PO) Take 1 tablet by mouth daily.   . bisoprolol (ZEBETA) 5 MG tablet Take 1 tablet (5 mg total) by mouth as directed. Take one tablet in the AM and may take extra pill in PM if needed for heart rate  . calcium gluconate 500 MG tablet Take 500 tablets by mouth daily.  . calcium-vitamin D (OSCAL WITH D) 500-200 MG-UNIT tablet Take 1 tablet by mouth daily with breakfast.  . Cholecalciferol (VITAMIN D3) 5000 units CAPS Take 5,000 Units by mouth daily.  Marland Kitchen ELIQUIS 5 MG TABS tablet TAKE 1 TABLET BY MOUTH TWICE A DAY  . esomeprazole (NEXIUM) 20 MG capsule Take 20 mg by mouth 2 (two) times daily.  . furosemide (LASIX) 20 MG tablet TAKE 1 TABLET (20 MG TOTAL) BY MOUTH 2 (TWO) TIMES  DAILY. (Patient taking differently: Take 20 mg by mouth 2 (two) times daily as needed. )  . hydrochlorothiazide (MICROZIDE) 12.5 MG capsule TAKE 1 CAPSULE BY MOUTH EVERY DAY  . losartan (COZAAR) 100 MG tablet TAKE 1 TABLET BY MOUTH EVERY DAY  . Multiple Vitamin (MULTIVITAMIN) tablet Take 1 tablet by mouth daily.  . potassium chloride (KLOR-CON) 10 MEQ tablet Take 1 tablet (10 mEq total) by mouth 2 (two) times daily. Please schedule office visit for further refills. Thank you!  . ranolazine (RANEXA) 500 MG 12 hr tablet TAKE 1 TABLET BY MOUTH TWICE A DAY  . rizatriptan (MAXALT) 10 MG tablet TAKE 1 TABLET (10 MG TOTAL) BY MOUTH AS NEEDED FOR MIGRAINE. MAY REPEAT IN 2 HOURS IF NEEDED  . rOPINIRole (REQUIP) 0.25 MG tablet Take 1 tablet (0.25 mg total) by mouth at bedtime.  Marland Kitchen rOPINIRole (REQUIP) 0.5 MG tablet TAKE 1 TABLET (0.5 MG TOTAL) BY MOUTH EVERY EVENING.  . rosuvastatin (CRESTOR) 20 MG tablet TAKE 1 TABLET BY MOUTH EVERY DAY  . sertraline (ZOLOFT) 100 MG tablet TAKE 1 AND 1/2 TABLETS BY MOUTH ONCE DAILY  . [DISCONTINUED] amiodarone (PACERONE) 200 MG tablet Take 200 mg by mouth daily.    Review of Systems   All other systems reviewed and are otherwise negative except as noted above.  Physical Exam    VS:  BP (!) 146/96 (BP Location: Left Arm, Patient Position: Sitting, Cuff Size: Large)   Pulse (!) 58   Ht 5\' 5"  (1.651 m)   Wt 290 lb (131.5 kg)   SpO2 98%   BMI 48.26 kg/m  , BMI Body mass index is 48.26 kg/m. GEN: Well nourished, overweigh, well developed, in no acute distress. HEENT: normal. Neck: Supple, no JVD, carotid bruits, or masses. Cardiac: irregularly irregular, no murmurs, rubs, or gallops. No clubbing, cyanosis, edema.  Radials/DP/PT 2+ and equal bilaterally.  Respiratory:  Respirations regular and unlabored, clear to auscultation bilaterally. GI: Soft, nontender, nondistended, BS + x 4. MS: No deformity or atrophy. Skin: Warm and dry, no rash. Neuro:  Strength and  sensation are intact. Psych: Normal affect.  Assessment & Plan    1. PAF -maintaining NSR by  EKG today.  Palpitations.  Amiodarone previously discontinued due to nausea.  Previously discontinued.  She will continue bisoprolol 5 mg daily.  2. Chronic anticoagulation - Secondary to PAF and CHA2DS2-VASc of at least 3 (female, age, HTN). Continue Eliquis 5mg  BID.  Reports no bleeding complications.    3. HTN -BP mildly elevated in clinic today though notes she just took her medication immediately prior to office visit.  Home BP in the afternoon at goal of <130/80. She is antihypertensive regimen including bisoprolol 5 mg daily, hydrochlorothiazide 12.5 mg daily, losartan 100 mg daily.  She will contact her office if her BP at home is consistently greater than 130/80 consider increased dose of HCTZ.  4. HLD - Lipid panel 11/2018 total cholesterol 182, HDL 70, LDL 89. Continue Crestor.  20 mg daily.  5. Nausea / H.Pylori / Gastritis -has been treated successfully for H. pylori.  Attributes her weight loss of 22 pounds over the last 6 months to her GI issues.  She is following closely with gastroenterology and just had colonoscopy and endoscopy last week which showed gastritis.   Disposition: Follow up in 6 month(s) with Dr. [name] or APP.    Loel Dubonnet, NP 05/20/2020, 12:31 PM

## 2020-05-21 ENCOUNTER — Other Ambulatory Visit: Payer: Self-pay | Admitting: Cardiovascular Disease

## 2020-05-21 ENCOUNTER — Other Ambulatory Visit (INDEPENDENT_AMBULATORY_CARE_PROVIDER_SITE_OTHER): Payer: Medicare Other

## 2020-05-21 DIAGNOSIS — G2581 Restless legs syndrome: Secondary | ICD-10-CM | POA: Diagnosis not present

## 2020-05-21 LAB — IBC + FERRITIN
Ferritin: 34.5 ng/mL (ref 10.0–291.0)
Iron: 109 ug/dL (ref 42–145)
Saturation Ratios: 25.3 % (ref 20.0–50.0)
Transferrin: 308 mg/dL (ref 212.0–360.0)

## 2020-05-28 ENCOUNTER — Encounter: Payer: Self-pay | Admitting: Family

## 2020-06-02 ENCOUNTER — Other Ambulatory Visit: Payer: Self-pay | Admitting: Family

## 2020-06-07 ENCOUNTER — Other Ambulatory Visit: Payer: Self-pay | Admitting: Family

## 2020-06-07 DIAGNOSIS — K760 Fatty (change of) liver, not elsewhere classified: Secondary | ICD-10-CM

## 2020-06-08 ENCOUNTER — Other Ambulatory Visit: Payer: Self-pay | Admitting: Family

## 2020-06-08 DIAGNOSIS — F419 Anxiety disorder, unspecified: Secondary | ICD-10-CM

## 2020-06-08 DIAGNOSIS — F32A Depression, unspecified: Secondary | ICD-10-CM

## 2020-06-17 ENCOUNTER — Encounter: Payer: Self-pay | Admitting: Family

## 2020-06-17 ENCOUNTER — Other Ambulatory Visit: Payer: Self-pay | Admitting: Cardiovascular Disease

## 2020-07-29 ENCOUNTER — Other Ambulatory Visit: Payer: Self-pay | Admitting: Family

## 2020-07-29 DIAGNOSIS — G2581 Restless legs syndrome: Secondary | ICD-10-CM

## 2020-08-05 ENCOUNTER — Other Ambulatory Visit: Payer: Self-pay | Admitting: Family

## 2020-08-05 DIAGNOSIS — G2581 Restless legs syndrome: Secondary | ICD-10-CM

## 2020-08-07 ENCOUNTER — Encounter: Payer: Self-pay | Admitting: Family

## 2020-08-07 ENCOUNTER — Telehealth (INDEPENDENT_AMBULATORY_CARE_PROVIDER_SITE_OTHER): Payer: Medicare Other | Admitting: Family

## 2020-08-07 ENCOUNTER — Ambulatory Visit: Payer: Medicare Other | Admitting: Family

## 2020-08-07 VITALS — BP 128/72 | HR 51 | Ht 65.0 in | Wt 292.0 lb

## 2020-08-07 DIAGNOSIS — I10 Essential (primary) hypertension: Secondary | ICD-10-CM

## 2020-08-07 DIAGNOSIS — F419 Anxiety disorder, unspecified: Secondary | ICD-10-CM

## 2020-08-07 DIAGNOSIS — I480 Paroxysmal atrial fibrillation: Secondary | ICD-10-CM | POA: Diagnosis not present

## 2020-08-07 DIAGNOSIS — K219 Gastro-esophageal reflux disease without esophagitis: Secondary | ICD-10-CM

## 2020-08-07 DIAGNOSIS — G2581 Restless legs syndrome: Secondary | ICD-10-CM | POA: Diagnosis not present

## 2020-08-07 DIAGNOSIS — Z1231 Encounter for screening mammogram for malignant neoplasm of breast: Secondary | ICD-10-CM

## 2020-08-07 MED ORDER — ROPINIROLE HCL 1 MG PO TABS: 1.0000 mg | ORAL_TABLET | Freq: Every day | ORAL | 1 refills | Status: DC

## 2020-08-07 MED ORDER — BUSPIRONE HCL 5 MG PO TABS: 5.0000 mg | ORAL_TABLET | Freq: Three times a day (TID) | ORAL | 1 refills | Status: DC | PRN

## 2020-08-07 NOTE — Assessment & Plan Note (Signed)
Uncontrolled. Continue zoloft 150mg  and xanax 0.25mg  daily prn. Cautioned against using xanax daily however and urged to use a couple times per week. Advised to store in safe place. Start buspar 5mg  tid. Close follow up.

## 2020-08-07 NOTE — Assessment & Plan Note (Addendum)
Uncontrolled. Increase  requip to 1mg  qhs.

## 2020-08-07 NOTE — Assessment & Plan Note (Signed)
Stable. Continue  bisoprolol, eliquis

## 2020-08-07 NOTE — Assessment & Plan Note (Signed)
Controlled. Continue  hctz 12.5mg  , potassium chloride 52meq bid, lasix 20mg  BID, losartan 100mg .

## 2020-08-07 NOTE — Assessment & Plan Note (Signed)
Pleased much improved. Continue nexium 20mg  bid prn.

## 2020-08-07 NOTE — Progress Notes (Signed)
Verbal consent for services obtained from patient prior to services given to TELEPHONE visit:   Location of call:  provider at work patient at home  Names of all persons present for services: [name], NP and patient Chief complaint:  Worsened anxiety. Depression is controlled.   Compliant with zoloft 150mg  ,had been taking xanax daily however has increased to daily xanax 0.25mg  daily which has been helpful. Thinks prior non bloody vomiting was in part related to anxiety.  Stress at home. Step daughter is living with her and grandchild. Feels like caring for children now.   Restless leg- breakthrough symptoms. Crawling sensation and has feeling that has to move them when sleeping. Occurring during the day as well. No numbness. Ferritin 34.5 . Compliant with multivitamin with iron. When we added the 0.25mg  requip  to .5mg  that was helpful however not helping as much.   HTN- Improved. She has started taking lasix qd which improvement of BP and BLE. compliant with hctz 12.5mg  , potassium chloride 5meq bid, lasix 20mg  BID, losartan 100mg .   Atrial fib- compliant bisoprolol, eliquis 5mg  bid;  follows with Dr  [name]  Angina-compliant with ranexa 500mg . No cp.   Epigastric pain- improved. No longer vomiting daily like she was going.  No longer following with [facility] . Compliant nexium 20mg  bid prn. No abdominal pain, weight loss.   She is no longer following with Dr Arther Dames Readings from Last 3 Encounters:  08/07/20 292 lb (132.5 kg)  05/20/20 290 lb (131.5 kg)  05/13/20 290 lb (131.5 kg)   BP Readings from Last 3 Encounters:  08/07/20 128/72  05/20/20 (!) 146/96  05/13/20 138/67     A/P/next steps:  Problem List Items Addressed This Visit      Cardiovascular and Mediastinum   Atrial fibrillation ([place])    Stable. Continue  bisoprolol, eliquis       Hypertension - Primary    Controlled. Continue  hctz 12.5mg  , potassium chloride 57meq bid, lasix 20mg  BID, losartan  100mg .      Relevant Orders   Basic metabolic panel     Digestive   GERD (gastroesophageal reflux disease)    Pleased much improved. Continue nexium 20mg  bid prn.        Other   Anxiety    Uncontrolled. Continue zoloft 150mg  and xanax 0.25mg  daily prn. Cautioned against using xanax daily however and urged to use a couple times per week. Advised to store in safe place. Start buspar 5mg  tid. Close follow up.      Relevant Medications   busPIRone (BUSPAR) 5 MG tablet   Restless leg syndrome    Uncontrolled. Increase  requip to 1mg  qhs.       Relevant Medications   rOPINIRole (REQUIP) 1 MG tablet    Other Visit Diagnoses    Encounter for screening mammogram for malignant neoplasm of breast       Relevant Orders   MM 3D SCREEN BREAST BILATERAL   Restless leg       Relevant Medications   rOPINIRole (REQUIP) 1 MG tablet       I spent 21 min  discussing plan of care over the phone.

## 2020-08-12 ENCOUNTER — Encounter: Payer: Self-pay | Admitting: Family

## 2020-08-14 ENCOUNTER — Other Ambulatory Visit: Payer: Self-pay | Admitting: Family

## 2020-08-18 ENCOUNTER — Other Ambulatory Visit: Payer: Self-pay | Admitting: Cardiovascular Disease

## 2020-08-18 NOTE — Telephone Encounter (Signed)
78f, 131.5kg, lovw/walker (05/20/20) Creatinine, Ser (03/25/20) 0.40 - 1.20 mg/dL 0.83   Refill received for eliquis 5mg  pt qualifies, refill sent.

## 2020-08-22 ENCOUNTER — Other Ambulatory Visit: Payer: Self-pay | Admitting: Cardiovascular Disease

## 2020-08-27 ENCOUNTER — Other Ambulatory Visit: Payer: Self-pay | Admitting: Family

## 2020-08-27 DIAGNOSIS — K760 Fatty (change of) liver, not elsewhere classified: Secondary | ICD-10-CM

## 2020-08-30 ENCOUNTER — Other Ambulatory Visit: Payer: Self-pay | Admitting: Family

## 2020-09-07 ENCOUNTER — Telehealth: Payer: Self-pay

## 2020-09-07 NOTE — Telephone Encounter (Signed)
I spoke with patient & she stated that she has a red trash in her skin fold of her stomach. It is a little itchy at times & does not appear infected. It is also not painful to pt. She first treated with powder with no help & then Vaseline, which she felt did help some. I suggested maybe she try some diaper rash cream that she does have on hand. Pt cannot do VV, but is willing to do phone call tomorrow if you feel appropriate?

## 2020-09-07 NOTE — Telephone Encounter (Signed)
Pt has rash on right hip for three weeks now. She thinks it may be from wearing depends. Tried to make a virtual appt but pt states that she can not do virtuals. Please advise.

## 2020-09-07 NOTE — Telephone Encounter (Signed)
Just FYI patient scheduled Wednesday at 11:30.

## 2020-09-07 NOTE — Telephone Encounter (Signed)
Please sch IN person appt  Let me know date of when scheduled as I want her to be seen in next couple of days

## 2020-09-08 ENCOUNTER — Other Ambulatory Visit: Payer: Self-pay | Admitting: Family

## 2020-09-08 DIAGNOSIS — F419 Anxiety disorder, unspecified: Secondary | ICD-10-CM

## 2020-09-08 DIAGNOSIS — F32A Depression, unspecified: Secondary | ICD-10-CM

## 2020-09-09 ENCOUNTER — Encounter: Payer: Self-pay | Admitting: Family

## 2020-09-09 ENCOUNTER — Other Ambulatory Visit: Payer: Self-pay

## 2020-09-09 ENCOUNTER — Ambulatory Visit (INDEPENDENT_AMBULATORY_CARE_PROVIDER_SITE_OTHER): Payer: Medicare Other | Admitting: Family

## 2020-09-09 VITALS — BP 118/80 | HR 66 | Temp 97.6°F | Ht 65.0 in | Wt 304.4 lb

## 2020-09-09 DIAGNOSIS — B372 Candidiasis of skin and nail: Secondary | ICD-10-CM | POA: Insufficient documentation

## 2020-09-09 DIAGNOSIS — F419 Anxiety disorder, unspecified: Secondary | ICD-10-CM | POA: Diagnosis not present

## 2020-09-09 DIAGNOSIS — I10 Essential (primary) hypertension: Secondary | ICD-10-CM | POA: Diagnosis not present

## 2020-09-09 DIAGNOSIS — R350 Frequency of micturition: Secondary | ICD-10-CM | POA: Diagnosis not present

## 2020-09-09 MED ORDER — CLOTRIMAZOLE 1 % EX OINT: 1.0000 "application " | TOPICAL_OINTMENT | Freq: Two times a day (BID) | CUTANEOUS | 3 refills | Status: DC

## 2020-09-09 MED ORDER — FLUCONAZOLE 150 MG PO TABS: 150.0000 mg | ORAL_TABLET | Freq: Once | ORAL | 2 refills | Status: AC

## 2020-09-09 NOTE — Patient Instructions (Addendum)
Trial of lasix 20mg  AND potassium chloride 3meq ONCE per day to see if urinary incontinence improves  Labs in one week  Suspected yeast infection, remember the below  Daily cleansing of intertriginous skin with a mild cleanser followed by drying of affected area with a hair dryer on a cool setting  ?Aeration of affected area when feasible  ?Daily application of drying powders, such as powders composed of microporous cellulose  ?Use of absorbent material or clothing, such as cotton or merino wool, to separate skin in folds  ?Application of barrier creams in areas that may come in contact with urine or feces  Zinc oxide, petrolatum, or dimethicone topicals can be applied after cleaning the intertriginous area with mild soap and water and drying with a hair dryer on a cold setting.

## 2020-09-09 NOTE — Assessment & Plan Note (Signed)
We agreed likely related to diuretic use. No dysuria. We agreed to reduce lasix to 20mg  qd as well as potassium chloride to qd as well. Pending BMP. She will let me know if improvement

## 2020-09-09 NOTE — Assessment & Plan Note (Signed)
Stable. Continues to stressors at home however we agreed today not to make medication changes. Continue zoloft 150mg , Xanax 0.25mg  qhs,buspar 5mg  TID

## 2020-09-09 NOTE — Progress Notes (Signed)
Subjective:    Patient ID: Amber Barnes, female    DOB: 1950-08-11, 70 y.o.   MRN: [id number]  CC: Amber Barnes is a 70 y.o. female who presents today for an acute visit and anxiety follow up.    HPI:  Rash under right side of abdomen.  Complains redness, odor, pain for 3-4 weeks, improved. Area was bleeding however this has since resolved.  She has been trying to keep clean and dry. She notes that she sweats in this area.  Had been using tacolm powder ; vaseline and barrier cream helped more.   No h/o DM  GAD- stress continues at home. Step daughter is living with them and recently home from rehab. Husband has multiple sclerosis. Compliant with zoloft 150mg . Compliant with Xanax 0.25mg  qhs which is stored in safe place.  buspar 5mg  TID which has been helpful.  She has a strong faith.  No si/hi.   Complains of urinary incontinence and having to wear a pad. No dysuria, fever. Compliant with lasix 20mg  BID which had previously been 20mg  QD. Compliant with hctz 12.5mg . Breathing at baseline. No cp, leg swelling.  She has lost 75 lbs in the last year. She is eating smaller portions. No further episodes of vomiting or nausea.  Compliant with nexium 20mg  prn.   echo EF 55-60%. Mild hypertrophy 2018.   HISTORY:  Past Medical History:  Diagnosis Date  . Achilles tendinitis   . Acute medial meniscal tear   . Allergy   . Anxiety   . Arthritis   . Atrial fibrillation ([place])    a. new onset 07/2016; b. CHADS2VASc --> 2 (HTN, female); c. eliquis  . CHF (congestive heart failure) ([place])   . Chronic lower back pain   . Colon polyp   . COPD (chronic obstructive pulmonary disease) ([place])   . Cystic breast   . Depression   . Dysrhythmia    ATRIAL FIB  . Endometriosis   . Fibrocystic breast disease   . GERD (gastroesophageal reflux disease)   . Hyperlipidemia   . Hypertension   . Insomnia   . Lipoma   . Migraine   . Osteoarthritis    left knee  . Osteopenia   . Sleep apnea     does not use her cpap  . Tendonitis   . Thyroid disease   . Vitamin D deficiency    Past Surgical History:  Procedure Laterality Date  . ABDOMINAL HYSTERECTOMY     ovaries left  . ACHILLES TENDON SURGERY     2012,01/2017  . BACK SURGERY    . CARDIOVERSION N/A 09/07/2016   Procedure: CARDIOVERSION;  Surgeon: Minna Merritts, MD;  Location: [facility];  Service: Cardiovascular;  Laterality: N/A;  . CARDIOVERSION N/A 10/19/2016   Procedure: CARDIOVERSION;  Surgeon: Minna Merritts, MD;  Location: [facility];  Service: Cardiovascular;  Laterality: N/A;  . CARDIOVERSION N/A 12/14/2016   Procedure: CARDIOVERSION;  Surgeon: Minna Merritts, MD;  Location: [facility];  Service: Cardiovascular;  Laterality: N/A;  . COLONOSCOPY    . COLONOSCOPY WITH PROPOFOL N/A 05/13/2020   Procedure: COLONOSCOPY WITH PROPOFOL;  Surgeon: Toledo, Benay Pike, MD;  Location: [facility];  Service: Gastroenterology;  Laterality: N/A;  ELIQUIS  . ESOPHAGOGASTRODUODENOSCOPY    . ESOPHAGOGASTRODUODENOSCOPY (EGD) WITH PROPOFOL N/A 05/13/2020   Procedure: ESOPHAGOGASTRODUODENOSCOPY (EGD) WITH PROPOFOL;  Surgeon: Toledo, Benay Pike, MD;  Location: [facility];  Service: Gastroenterology;  Laterality: N/A;  . FOOT SURGERY    . JOINT REPLACEMENT    .  THYROIDECTOMY     thyroid lobectomy secondary to a benign nodule  . TOTAL HIP ARTHROPLASTY Right 08/21/2018   Procedure: TOTAL HIP ARTHROPLASTY ANTERIOR APPROACH-RIGHT CELL SAVER REQUESTED;  Surgeon: [name], MD;  Location: [facility];  Service: Orthopedics;  Laterality: Right;   Family History  Problem Relation Age of Onset  . Cancer Sister 21       ovarian  . Depression Sister   . Cancer Brother        CLL  . Cancer Father 32       colon  . Heart disease Father   . Heart attack Father 72       died from MI  . Hyperlipidemia Father   . Hypertension Father   . Cancer Mother        leukemia  . Breast cancer Paternal Aunt        14's  . Migraines Neg Hx      Allergies: Prednisone, Hydrocodone-acetaminophen, Oxycodone, Tapentadol, Tramadol, Adhesive [tape], and Latex Current Outpatient Medications on File Prior to Visit  Medication Sig Dispense Refill  . acetaminophen (TYLENOL) 500 MG tablet Take 500 mg by mouth every 6 (six) hours as needed. Taking as needed    . albuterol (VENTOLIN HFA) 108 (90 Base) MCG/ACT inhaler INHALE 2 PUFFS INTO THE LUNGS EVERY 8 (EIGHT) HOURS AS NEEDED FOR WHEEZING OR SHORTNESS OF BREATH. 6.7 g 0  . ALPRAZolam (XANAX) 0.25 MG tablet TAKE 1 TABLET BY MOUTH DAILY AS NEEDED FOR ANXIETY OR SLEEP. 30 tablet 1  . azelastine (ASTELIN) 0.1 % nasal spray PLACE 1 SPRAY INTO BOTH NOSTRILS 2 (TWO) TIMES DAILY. USE IN EACH NOSTRIL AS DIRECTED 30 mL 1  . B Complex Vitamins (VITAMIN B COMPLEX PO) Take 1 tablet by mouth daily.     . bisoprolol (ZEBETA) 5 MG tablet Take 1 tablet (5 mg total) by mouth as directed. Take one tablet in the AM and may take extra pill in PM if needed for heart rate (Patient taking differently: Take 5 mg by mouth 2 (two) times daily. Take one tablet in the AM and may take extra pill in PM if needed for heart rate) 180 tablet 3  . busPIRone (BUSPAR) 5 MG tablet Take 1 tablet (5 mg total) by mouth 3 (three) times daily as needed. 60 tablet 1  . calcium gluconate 500 MG tablet Take 500 tablets by mouth daily.    . calcium-vitamin D (OSCAL WITH D) 500-200 MG-UNIT tablet Take 1 tablet by mouth daily with breakfast.    . Cholecalciferol (VITAMIN D3) 5000 units CAPS Take 5,000 Units by mouth daily.    Marland Kitchen ELIQUIS 5 MG TABS tablet TAKE 1 TABLET BY MOUTH TWICE A DAY 180 tablet 1  . esomeprazole (NEXIUM) 20 MG capsule Take 20 mg by mouth 2 (two) times daily.    . furosemide (LASIX) 20 MG tablet TAKE 1 TABLET (20 MG TOTAL) BY MOUTH 2 (TWO) TIMES DAILY. (Patient taking differently: Take 20 mg by mouth 2 (two) times daily as needed.) 180 tablet 3  . hydrochlorothiazide (MICROZIDE) 12.5 MG capsule TAKE 1 CAPSULE BY MOUTH EVERY  DAY 90 capsule 2  . losartan (COZAAR) 100 MG tablet TAKE 1 TABLET BY MOUTH EVERY DAY 90 tablet 0  . Multiple Vitamin (MULTIVITAMIN) tablet Take 1 tablet by mouth daily.    . potassium chloride (KLOR-CON) 10 MEQ tablet TAKE 1 TABLET TWICE DAILY. PLEASE MAKE APPT FOR FUTURE REFILLS. 180 tablet 0  . ranolazine (RANEXA) 500 MG 12  hr tablet TAKE 1 TABLET BY MOUTH TWICE A DAY 180 tablet 3  . rizatriptan (MAXALT) 10 MG tablet TAKE 1 TABLET (10 MG TOTAL) BY MOUTH AS NEEDED FOR MIGRAINE. MAY REPEAT IN 2 HOURS IF NEEDED 10 tablet 0  . rOPINIRole (REQUIP) 1 MG tablet Take 1 tablet (1 mg total) by mouth at bedtime. 90 tablet 1  . rosuvastatin (CRESTOR) 20 MG tablet TAKE 1 TABLET BY MOUTH EVERY DAY 90 tablet 0  . sertraline (ZOLOFT) 100 MG tablet TAKE 1 AND 1/2 TABLETS BY MOUTH ONCE DAILY 135 tablet 2   No current facility-administered medications on file prior to visit.    Social History   Tobacco Use  . Smoking status: Former Smoker    Packs/day: 0.25    Years: 10.00    Pack years: 2.50    Types: Cigarettes    Quit date: 2005    Years since quitting: 17.1  . Smokeless tobacco: Never Used  . Tobacco comment: smoked for 10 years, 1 pack every 2-3 days  Vaping Use  . Vaping Use: Never used  Substance Use Topics  . Alcohol use: Not Currently    Comment: social, occasional  . Drug use: No    Review of Systems  Constitutional: Negative for chills and fever.  Respiratory: Negative for cough.   Cardiovascular: Negative for chest pain, palpitations and leg swelling.  Gastrointestinal: Negative for nausea and vomiting.  Skin: Positive for rash.  Psychiatric/Behavioral: Negative for suicidal ideas. The patient is nervous/anxious.       Objective:    BP 118/80   Pulse 66   Temp 97.6 F (36.4 C)   Ht 5\' 5"  (1.651 m)   Wt (!) 304 lb 6.4 oz (138.1 kg)   SpO2 95%   BMI 50.65 kg/m  BP Readings from Last 3 Encounters:  09/09/20 118/80  08/07/20 128/72  05/20/20 (!) 146/96     Physical  Exam Vitals reviewed.  Constitutional:      Appearance: She is well-developed and well-nourished.  Eyes:     Conjunctiva/sclera: Conjunctivae normal.  Cardiovascular:     Rate and Rhythm: Normal rate and regular rhythm.     Pulses: Normal pulses.     Heart sounds: Normal heart sounds.  Pulmonary:     Effort: Pulmonary effort is normal.     Breath sounds: Normal breath sounds. No wheezing, rhonchi or rales.  Skin:    General: Skin is warm and dry.     Comments: Hypererythematous area right pannus. white film noted. discrete lesions, or vesicular lesions present.   Neurological:     Mental Status: She is alert.  Psychiatric:        Mood and Affect: Mood and affect normal.        Speech: Speech normal.        Behavior: Behavior normal.        Thought Content: Thought content normal.        Assessment & Plan:   Problem List Items Addressed This Visit      Cardiovascular and Mediastinum   Hypertension   Relevant Orders   Basic metabolic panel     Musculoskeletal and Integument   Candidal intertrigo - Primary    Improving with barrier cream at home. Counseled on keeping area dry. Treat with diflucan and topical clotrimazole if needed.       Relevant Medications   fluconazole (DIFLUCAN) 150 MG tablet   Clotrimazole 1 % OINT     Other  Anxiety    Stable. Continues to stressors at home however we agreed today not to make medication changes. Continue zoloft 150mg , Xanax 0.25mg  qhs,buspar 5mg  TID       Urinary frequency    We agreed likely related to diuretic use. No dysuria. We agreed to reduce lasix to 20mg  qd as well as potassium chloride to qd as well. Pending BMP. She will let me know if improvement          I am having [name] "[name]" start on fluconazole and Clotrimazole. I am also having her maintain her multivitamin, B Complex Vitamins (VITAMIN B COMPLEX PO), Vitamin D3, calcium gluconate, rizatriptan, furosemide, albuterol, acetaminophen,  bisoprolol, sertraline, esomeprazole, calcium-vitamin D, ranolazine, azelastine, busPIRone, rOPINIRole, potassium chloride, Eliquis, losartan, rosuvastatin, hydrochlorothiazide, and ALPRAZolam.   Meds ordered this encounter  Medications  . fluconazole (DIFLUCAN) 150 MG tablet    Sig: Take 1 tablet (150 mg total) by mouth once for 1 dose. Take one tablet PO once. If sxs persist, may take one tablet PO 3 days later.    Dispense:  2 tablet    Refill:  2    Order Specific Question:   Supervising Provider    Answer:   Derrel Nip, TERESA L [[id number]]  . Clotrimazole 1 % OINT    Sig: Apply 1 application topically 2 (two) times daily.    Dispense:  56.7 g    Refill:  3    Order Specific Question:   Supervising Provider    Answer:   Crecencio Mc [[id number]]    Return precautions given.   Risks, benefits, and alternatives of the medications and treatment plan prescribed today were discussed, and patient expressed understanding.   Education regarding symptom management and diagnosis given to patient on AVS.  Continue to follow with [name], FNP for routine health maintenance.   [name] and I agreed with plan.   [name], FNP

## 2020-09-09 NOTE — Assessment & Plan Note (Signed)
Improving with barrier cream at home. Counseled on keeping area dry. Treat with diflucan and topical clotrimazole if needed.

## 2020-09-16 ENCOUNTER — Other Ambulatory Visit: Payer: Medicare Other

## 2020-09-22 ENCOUNTER — Other Ambulatory Visit: Payer: Self-pay

## 2020-09-22 ENCOUNTER — Other Ambulatory Visit (INDEPENDENT_AMBULATORY_CARE_PROVIDER_SITE_OTHER): Payer: Medicare Other

## 2020-09-22 DIAGNOSIS — I10 Essential (primary) hypertension: Secondary | ICD-10-CM

## 2020-09-22 LAB — BASIC METABOLIC PANEL
BUN: 16 mg/dL (ref 6–23)
CO2: 30 mEq/L (ref 19–32)
Calcium: 9.2 mg/dL (ref 8.4–10.5)
Chloride: 100 mEq/L (ref 96–112)
Creatinine, Ser: 0.89 mg/dL (ref 0.40–1.20)
GFR: 66.91 mL/min (ref >60.00)
Glucose, Bld: 98 mg/dL (ref 70–99)
Potassium: 4 mEq/L (ref 3.5–5.1)
Sodium: 141 mEq/L (ref 135–145)

## 2020-09-28 ENCOUNTER — Other Ambulatory Visit: Payer: Self-pay | Admitting: Family

## 2020-09-28 DIAGNOSIS — F419 Anxiety disorder, unspecified: Secondary | ICD-10-CM

## 2020-09-29 ENCOUNTER — Other Ambulatory Visit: Payer: Self-pay | Admitting: Cardiovascular Disease

## 2020-10-10 ENCOUNTER — Other Ambulatory Visit: Payer: Self-pay | Admitting: Family

## 2020-10-10 DIAGNOSIS — G2581 Restless legs syndrome: Secondary | ICD-10-CM

## 2020-11-12 ENCOUNTER — Other Ambulatory Visit: Payer: Self-pay | Admitting: Family

## 2020-11-12 DIAGNOSIS — F419 Anxiety disorder, unspecified: Secondary | ICD-10-CM

## 2020-11-12 DIAGNOSIS — F32A Depression, unspecified: Secondary | ICD-10-CM

## 2020-11-12 NOTE — Telephone Encounter (Signed)
RX Refill:xanax Last Seen:09-09-20 Last ordered:09-09-20

## 2020-11-15 ENCOUNTER — Other Ambulatory Visit: Payer: Self-pay | Admitting: Family

## 2020-11-15 DIAGNOSIS — F419 Anxiety disorder, unspecified: Secondary | ICD-10-CM

## 2020-11-21 ENCOUNTER — Other Ambulatory Visit: Payer: Self-pay | Admitting: Cardiovascular Disease

## 2020-11-22 ENCOUNTER — Other Ambulatory Visit: Payer: Self-pay | Admitting: Family

## 2020-11-22 DIAGNOSIS — K760 Fatty (change of) liver, not elsewhere classified: Secondary | ICD-10-CM

## 2020-11-25 ENCOUNTER — Other Ambulatory Visit: Payer: Self-pay | Admitting: Family

## 2020-11-25 ENCOUNTER — Other Ambulatory Visit: Payer: Self-pay | Admitting: Cardiovascular Disease

## 2020-11-25 DIAGNOSIS — K760 Fatty (change of) liver, not elsewhere classified: Secondary | ICD-10-CM

## 2020-11-25 NOTE — Telephone Encounter (Signed)
Please review for refill.  

## 2020-11-25 NOTE — Telephone Encounter (Signed)
Prescription refill request for Eliquis received. Indication: Atrial fib Last office visit: 05/20/20 Scr: 0.89 on 09/22/20 Age: 70 Weight: 131.5kg  Based on above findings Eliquis 5mg  twice daily is the appropriate dose.  Refill approved.

## 2020-11-27 ENCOUNTER — Ambulatory Visit (INDEPENDENT_AMBULATORY_CARE_PROVIDER_SITE_OTHER): Payer: Medicare Other

## 2020-11-27 ENCOUNTER — Other Ambulatory Visit: Payer: Self-pay

## 2020-11-27 VITALS — BP 105/69 | HR 75 | Temp 96.7°F | Resp 16 | Ht 65.0 in | Wt 302.6 lb

## 2020-11-27 DIAGNOSIS — Z0000 Encounter for general adult medical examination without abnormal findings: Secondary | ICD-10-CM

## 2020-11-27 NOTE — Patient Instructions (Addendum)
[name] , Thank you for taking time to come for your Medicare Wellness Visit. I appreciate your ongoing commitment to your health goals. Please review the following plan we discussed and let me know if I can assist you in the future.   These are the goals we discussed: Goals      Patient Stated   .  I want to walk in the pool for exercise (pt-stated)       This is a list of the screening recommended for you and due dates:  Health Maintenance  Topic Date Due  . Mammogram  03/08/2020  . COVID-19 Vaccine (4 - Booster for Pfizer series) 12/13/2020*  . Flu Shot  02/08/2021  . Tetanus Vaccine  11/02/2022  . Colon Cancer Screening  05/14/2023  . DEXA scan (bone density measurement)  Completed  . Hepatitis C Screening: USPSTF Recommendation to screen - Ages 24-79 yo.  Completed  . Pneumonia vaccines  Completed  . HPV Vaccine  Aged Out  *Topic was postponed. The date shown is not the original due date.    Immunizations Immunization History  Administered Date(s) Administered  . Fluad Quad(high Dose 65+) 04/08/2019, 03/30/2020  . Influenza Inj Mdck Quad Pf 04/03/2017  . Influenza,inj,Quad PF,6+ Mos 04/14/2016, 03/30/2018  . Influenza-Unspecified 03/07/2019  . PFIZER(Purple Top)SARS-COV-2 Vaccination 08/23/2019, 09/18/2019, 06/02/2020  . Pneumococcal Conjugate-13 03/30/2018  . Pneumococcal Polysaccharide-23 04/18/2019  . Pneumococcal-Unspecified 03/23/2012  . Td 11/01/2012  . Zoster 10/07/2012   Advanced directives: End of life planning; Advance aging; Advanced directives discussed.  Copy of current HCPOA/Living Will requested.    Conditions/risks identified: none new  Follow up in one year for your annual wellness visit    Preventive Care 65 Years and Older, Female Preventive care refers to lifestyle choices and visits with your health care provider that can promote health and wellness. What does preventive care include?  A yearly physical exam. This is also called an  annual well check.  Dental exams once or twice a year.  Routine eye exams. Ask your health care provider how often you should have your eyes checked.  Personal lifestyle choices, including:  Daily care of your teeth and gums.  Regular physical activity.  Eating a healthy diet.  Avoiding tobacco and drug use.  Limiting alcohol use.  Practicing safe sex.  Taking low-dose aspirin every day.  Taking vitamin and mineral supplements as recommended by your health care provider. What happens during an annual well check? The services and screenings done by your health care provider during your annual well check will depend on your age, overall health, lifestyle risk factors, and family history of disease. Counseling  Your health care provider may ask you questions about your:  Alcohol use.  Tobacco use.  Drug use.  Emotional well-being.  Home and relationship well-being.  Sexual activity.  Eating habits.  History of falls.  Memory and ability to understand (cognition).  Work and work Statistician.  Reproductive health. Screening  You may have the following tests or measurements:  Height, weight, and BMI.  Blood pressure.  Lipid and cholesterol levels. These may be checked every 5 years, or more frequently if you are over 31 years old.  Skin check.  Lung cancer screening. You may have this screening every year starting at age 69 if you have a 30-pack-year history of smoking and currently smoke or have quit within the past 15 years.  Fecal occult blood test (FOBT) of the stool. You may have this test every  year starting at age 5.  Flexible sigmoidoscopy or colonoscopy. You may have a sigmoidoscopy every 5 years or a colonoscopy every 10 years starting at age 21.  Hepatitis C blood test.  Hepatitis B blood test.  Sexually transmitted disease (STD) testing.  Diabetes screening. This is done by checking your blood sugar (glucose) after you have not eaten for  a while (fasting). You may have this done every 1-3 years.  Bone density scan. This is done to screen for osteoporosis. You may have this done starting at age 78.  Mammogram. This may be done every 1-2 years. Talk to your health care provider about how often you should have regular mammograms. Talk with your health care provider about your test results, treatment options, and if necessary, the need for more tests. Vaccines  Your health care provider may recommend certain vaccines, such as:  Influenza vaccine. This is recommended every year.  Tetanus, diphtheria, and acellular pertussis (Tdap, Td) vaccine. You may need a Td booster every 10 years.  Zoster vaccine. You may need this after age 73.  Pneumococcal 13-valent conjugate (PCV13) vaccine. One dose is recommended after age 67.  Pneumococcal polysaccharide (PPSV23) vaccine. One dose is recommended after age 7. Talk to your health care provider about which screenings and vaccines you need and how often you need them. This information is not intended to replace advice given to you by your health care provider. Make sure you discuss any questions you have with your health care provider. Document Released: 07/24/2015 Document Revised: 03/16/2016 Document Reviewed: 04/28/2015 Elsevier Interactive Patient Education  2017 [place] Prevention in the Home Falls can cause injuries. They can happen to people of all ages. There are many things you can do to make your home safe and to help prevent falls. What can I do on the outside of my home?  Regularly fix the edges of walkways and driveways and fix any cracks.  Remove anything that might make you trip as you walk through a door, such as a raised step or threshold.  Trim any bushes or trees on the path to your home.  Use bright outdoor lighting.  Clear any walking paths of anything that might make someone trip, such as rocks or tools.  Regularly check to see if handrails  are loose or broken. Make sure that both sides of any steps have handrails.  Any raised decks and porches should have guardrails on the edges.  Have any leaves, snow, or ice cleared regularly.  Use sand or salt on walking paths during winter.  Clean up any spills in your garage right away. This includes oil or grease spills. What can I do in the bathroom?  Use night lights.  Install grab bars by the toilet and in the tub and shower. Do not use towel bars as grab bars.  Use non-skid mats or decals in the tub or shower.  If you need to sit down in the shower, use a plastic, non-slip stool.  Keep the floor dry. Clean up any water that spills on the floor as soon as it happens.  Remove soap buildup in the tub or shower regularly.  Attach bath mats securely with double-sided non-slip rug tape.  Do not have throw rugs and other things on the floor that can make you trip. What can I do in the bedroom?  Use night lights.  Make sure that you have a light by your bed that is easy to reach.  Do  not use any sheets or blankets that are too big for your bed. They should not hang down onto the floor.  Have a firm chair that has side arms. You can use this for support while you get dressed.  Do not have throw rugs and other things on the floor that can make you trip. What can I do in the kitchen?  Clean up any spills right away.  Avoid walking on wet floors.  Keep items that you use a lot in easy-to-reach places.  If you need to reach something above you, use a strong step stool that has a grab bar.  Keep electrical cords out of the way.  Do not use floor polish or wax that makes floors slippery. If you must use wax, use non-skid floor wax.  Do not have throw rugs and other things on the floor that can make you trip. What can I do with my stairs?  Do not leave any items on the stairs.  Make sure that there are handrails on both sides of the stairs and use them. Fix handrails  that are broken or loose. Make sure that handrails are as long as the stairways.  Check any carpeting to make sure that it is firmly attached to the stairs. Fix any carpet that is loose or worn.  Avoid having throw rugs at the top or bottom of the stairs. If you do have throw rugs, attach them to the floor with carpet tape.  Make sure that you have a light switch at the top of the stairs and the bottom of the stairs. If you do not have them, ask someone to add them for you. What else can I do to help prevent falls?  Wear shoes that:  Do not have high heels.  Have rubber bottoms.  Are comfortable and fit you well.  Are closed at the toe. Do not wear sandals.  If you use a stepladder:  Make sure that it is fully opened. Do not climb a closed stepladder.  Make sure that both sides of the stepladder are locked into place.  Ask someone to hold it for you, if possible.  Clearly mark and make sure that you can see:  Any grab bars or handrails.  First and last steps.  Where the edge of each step is.  Use tools that help you move around (mobility aids) if they are needed. These include:  Canes.  Walkers.  Scooters.  Crutches.  Turn on the lights when you go into a dark area. Replace any light bulbs as soon as they burn out.  Set up your furniture so you have a clear path. Avoid moving your furniture around.  If any of your floors are uneven, fix them.  If there are any pets around you, be aware of where they are.  Review your medicines with your doctor. Some medicines can make you feel dizzy. This can increase your chance of falling. Ask your doctor what other things that you can do to help prevent falls. This information is not intended to replace advice given to you by your health care provider. Make sure you discuss any questions you have with your health care provider. Document Released: 04/23/2009 Document Revised: 12/03/2015 Document Reviewed: 08/01/2014 Elsevier  Interactive Patient Education  2017 [employer].

## 2020-11-27 NOTE — Progress Notes (Addendum)
Subjective:   Amber Barnes is a 70 y.o. female who presents for Medicare Annual (Subsequent) preventive examination.  Review of Systems    No ROS.  Medicare Wellness   Cardiac Risk Factors include: advanced age (>105men, >29 women);hypertension     Objective:    Today's Vitals   11/27/20 1359  BP: 105/69  Pulse: 75  Resp: 16  Temp: (!) 96.7 F (35.9 C)  SpO2: 95%  Weight: (!) 302 lb 9.6 oz (137.3 kg)  Height: 5\' 5"  (1.651 m)   Body mass index is 50.36 kg/m.  Advanced Directives 11/27/2020 05/20/2020 05/13/2020 03/31/2020 11/27/2019 11/13/2019 11/05/2019  Does Patient Have a Medical Advance Directive? Yes Yes Yes Yes Yes Yes No  Type of Paramedic of [place];Living will [place];Living will [place];Living will Living will;Healthcare Power of [place];Living will [place];Living will -  Does patient want to make changes to medical advance directive? No - Patient declined - - - No - Patient declined - -  Copy of [place] in Chart? No - copy requested - - - No - copy requested - -  Would patient like information on creating a medical advance directive? - - - - - - -    Current Medications (verified) Outpatient Encounter Medications as of 11/27/2020  Medication Sig  . acetaminophen (TYLENOL) 500 MG tablet Take 500 mg by mouth every 6 (six) hours as needed. Taking as needed  . albuterol (VENTOLIN HFA) 108 (90 Base) MCG/ACT inhaler INHALE 2 PUFFS INTO THE LUNGS EVERY 8 (EIGHT) HOURS AS NEEDED FOR WHEEZING OR SHORTNESS OF BREATH.  Marland Kitchen ALPRAZolam (XANAX) 0.25 MG tablet TAKE 1 TABLET BY MOUTH DAILY AS NEEDED FOR ANXIETY OR SLEEP.  Marland Kitchen azelastine (ASTELIN) 0.1 % nasal spray PLACE 1 SPRAY INTO BOTH NOSTRILS 2 (TWO) TIMES DAILY. USE IN EACH NOSTRIL AS DIRECTED  . B Complex Vitamins (VITAMIN B COMPLEX PO) Take 1 tablet by mouth daily.   . bisoprolol (ZEBETA) 5  MG tablet Take 1 tablet (5 mg total) by mouth as directed. Take one tablet in the AM and may take extra pill in PM if needed for heart rate (Patient taking differently: Take 5 mg by mouth 2 (two) times daily. Take one tablet in the AM and may take extra pill in PM if needed for heart rate)  . busPIRone (BUSPAR) 5 MG tablet TAKE 1 TABLET BY MOUTH THREE TIMES A DAY AS NEEDED  . calcium gluconate 500 MG tablet Take 500 tablets by mouth daily.  . calcium-vitamin D (OSCAL WITH D) 500-200 MG-UNIT tablet Take 1 tablet by mouth daily with breakfast.  . Cholecalciferol (VITAMIN D3) 5000 units CAPS Take 5,000 Units by mouth daily.  . Clotrimazole 1 % OINT Apply 1 application topically 2 (two) times daily.  Marland Kitchen ELIQUIS 5 MG TABS tablet TAKE 1 TABLET BY MOUTH TWICE A DAY  . esomeprazole (NEXIUM) 20 MG capsule Take 20 mg by mouth 2 (two) times daily.  . furosemide (LASIX) 20 MG tablet TAKE 1 TABLET BY MOUTH TWICE A DAY  . hydrochlorothiazide (MICROZIDE) 12.5 MG capsule TAKE 1 CAPSULE BY MOUTH EVERY DAY  . losartan (COZAAR) 100 MG tablet Take 1 tablet (100 mg total) by mouth daily. PLEASE CALL OFFICE TO SCHEDULE AN APPOINTMENT FOR FURTHER REFILLS  . Multiple Vitamin (MULTIVITAMIN) tablet Take 1 tablet by mouth daily.  . potassium chloride (KLOR-CON) 10 MEQ tablet TAKE 1 TABLET TWICE DAILY.  PLEASE MAKE APPT FOR FUTURE REFILLS.  Marland Kitchen ranolazine (RANEXA) 500 MG 12 hr tablet TAKE 1 TABLET BY MOUTH TWICE A DAY  . rizatriptan (MAXALT) 10 MG tablet TAKE 1 TABLET (10 MG TOTAL) BY MOUTH AS NEEDED FOR MIGRAINE. MAY REPEAT IN 2 HOURS IF NEEDED  . rOPINIRole (REQUIP) 1 MG tablet Take 1 tablet (1 mg total) by mouth at bedtime.  . rosuvastatin (CRESTOR) 20 MG tablet TAKE 1 TABLET BY MOUTH EVERY DAY  . sertraline (ZOLOFT) 100 MG tablet TAKE 1 AND 1/2 TABLETS BY MOUTH ONCE DAILY   No facility-administered encounter medications on file as of 11/27/2020.    Allergies (verified) Prednisone, Hydrocodone-acetaminophen, Oxycodone,  Tapentadol, Tramadol, Adhesive [tape], and Latex   History: Past Medical History:  Diagnosis Date  . Achilles tendinitis   . Acute medial meniscal tear   . Allergy   . Anxiety   . Arthritis   . Atrial fibrillation ([place])    a. new onset 07/2016; b. CHADS2VASc --> 2 (HTN, female); c. eliquis  . CHF (congestive heart failure) ([place])   . Chronic lower back pain   . Colon polyp   . COPD (chronic obstructive pulmonary disease) ([place])   . Cystic breast   . Depression   . Dysrhythmia    ATRIAL FIB  . Endometriosis   . Fibrocystic breast disease   . GERD (gastroesophageal reflux disease)   . Hyperlipidemia   . Hypertension   . Insomnia   . Lipoma   . Migraine   . Osteoarthritis    left knee  . Osteopenia   . Sleep apnea    does not use her cpap  . Tendonitis   . Thyroid disease   . Vitamin D deficiency    Past Surgical History:  Procedure Laterality Date  . ABDOMINAL HYSTERECTOMY     ovaries left  . ACHILLES TENDON SURGERY     2012,01/2017  . BACK SURGERY    . CARDIOVERSION N/A 09/07/2016   Procedure: CARDIOVERSION;  Surgeon: Minna Merritts, MD;  Location: [facility] ORS;  Service: Cardiovascular;  Laterality: N/A;  . CARDIOVERSION N/A 10/19/2016   Procedure: CARDIOVERSION;  Surgeon: Minna Merritts, MD;  Location: [facility] OR[facility];  Service: Cardiovascular;  Laterality: N/A;  . CARDIOVERSION N/A 12/14/2016   Procedure: CARDIOVERSION;  Surgeon: Minna Merritts, MD;  Location: [facility];  Service: Cardiovascular;  Laterality: N/A;  . COLONOSCOPY    . COLONOSCOPY WITH PROPOFOL N/A 05/13/2020   Procedure: COLONOSCOPY WITH PROPOFOL;  Surgeon: Toledo, Benay Pike, MD;  Location: [facility];  Service: Gastroenterology;  Laterality: N/A;  ELIQUIS  . ESOPHAGOGASTRODUODENOSCOPY    . ESOPHAGOGASTRODUODENOSCOPY (EGD) WITH PROPOFOL N/A 05/13/2020   Procedure: ESOPHAGOGASTRODUODENOSCOPY (EGD) WITH PROPOFOL;  Surgeon: Toledo, Benay Pike, MD;  Location: [facility];  Service: Gastroenterology;   Laterality: N/A;  . FOOT SURGERY    . JOINT REPLACEMENT    . THYROIDECTOMY     thyroid lobectomy secondary to a benign nodule  . TOTAL HIP ARTHROPLASTY Right 08/21/2018   Procedure: TOTAL HIP ARTHROPLASTY ANTERIOR APPROACH-RIGHT CELL SAVER REQUESTED;  Surgeon: [name], MD;  Location: [facility] OR[facility];  Service: Orthopedics;  Laterality: Right;   Family History  Problem Relation Age of [place]  . Cancer Sister 82       ovarian  . Depression Sister   . Cancer Brother        CLL  . Cancer Father 53       colon  . Heart disease Father   . Heart attack  Father 55       died from MI  . Hyperlipidemia Father   . Hypertension Father   . Cancer Mother        leukemia  . Breast cancer Paternal Aunt        45's  . Migraines Neg Hx    Social History   Socioeconomic History  . Marital status: Married    Spouse name: jeffrey  . Number of children: 1  . Years of education: Not on file  . Highest education level: High school graduate  Occupational History    Comment: disabled  Tobacco Use  . Smoking status: Former Smoker    Packs/day: 0.25    Years: 10.00    Pack years: 2.50    Types: Cigarettes    Quit date: 2005    Years since quitting: 17.3  . Smokeless tobacco: Never Used  . Tobacco comment: smoked for 10 years, 1 pack every 2-3 days  Vaping Use  . Vaping Use: Never used  Substance and Sexual Activity  . Alcohol use: Not Currently    Comment: social, occasional  . Drug use: No  . Sexual activity: Not Currently  Other Topics Concern  . Not on file  Social History Narrative   Moved to [place] from [place], originally from [place].    1 cup of caffeine daily   Right handed          Social Determinants of Health   Financial Resource Strain: Low Risk   . Difficulty of Paying Living Expenses: Not very hard  Food Insecurity: No Food Insecurity  . Worried About Charity fundraiser in the Last Year: Never true  . Ran Out of Food in the Last Year: Never  true  Transportation Needs: No Transportation Needs  . Lack of Transportation (Medical): No  . Lack of Transportation (Non-Medical): No  Physical Activity: Unknown  . Days of Exercise per Week: 0 days  . Minutes of Exercise per Session: Not on file  Stress: Stress Concern Present  . Feeling of Stress : Rather much  Social Connections: Moderately Isolated  . Frequency of Communication with Friends and Family: Never  . Frequency of Social Gatherings with Friends and Family: Once a week  . Attends Religious Services: More than 4 times per year  . Active Member of Clubs or Organizations: No  . Attends Archivist Meetings: Never  . Marital Status: Married    Tobacco Counseling Counseling given: Not Answered Comment: smoked for 10 years, 1 pack every 2-3 days   Clinical Intake:  Pre-visit preparation completed: Yes        Diabetes: No  How often do you need to have someone help you when you read instructions, pamphlets, or other written materials from your doctor or pharmacy?: 1 - Never  Interpreter Needed?: No      Activities of Daily Living In your present state of health, do you have any difficulty performing the following activities: 11/27/2020  Hearing? N  Vision? N  Difficulty concentrating or making decisions? N  Walking or climbing stairs? Y  Comment Unsteady gait. Amber Barnes is use.  Dressing or bathing? N  Doing errands, shopping? N  Preparing Food and eating ? Y  Comment Daughter assist  Using the Toilet? N  In the past six months, have you accidently leaked urine? Y  Comment Managed with daily depend brief  Do you have problems with loss of bowel control? N  Managing your Medications?  N  Managing your Finances? N  Housekeeping or managing your Housekeeping? Y  Comment Daughter assist  Some recent data might be hidden    Patient Care Team: [name], FNP as PCP - General (Family Medicine) [name] [name], MD as PCP - Cardiology  (Cardiology) Charletta Cousin, MD (Inactive) [name], [name], MD as Consulting Physician (Cardiology)  Indicate any recent Medical Services you may have received from other than [facility] providers in the past year (date may be approximate).     Assessment:   This is a routine wellness examination for Amber Barnes.  Hearing/Vision screen  Hearing Screening   125Hz  250Hz  500Hz  1000Hz  2000Hz  3000Hz  4000Hz  6000Hz  8000Hz   Right ear:           Left ear:           Comments: Patient is able to hear conversational tones without difficulty.  No issues reported.  Vision Screening Comments: Patient is able to hear conversational tones without difficulty.  No issues reported.     Dietary issues and exercise activities discussed: Current Exercise Habits: The patient does not participate in regular exercise at present  Regular diet; low cholesterol Good water intake  Goals Addressed              This Visit's Progress     Patient Stated   .  I want to walk in the pool for exercise (pt-stated)        Depression Screen PHQ 2/9 Scores 11/27/2020 09/09/2020 08/07/2020 05/06/2020 12/18/2019 11/27/2019 10/18/2019  PHQ - 2 Score 0 - - 0 0 0 0  PHQ- 9 Score - - - 3 7 2 2   Exception Documentation - Other- indicate reason in comment box Other- indicate reason in comment box - - - -  Not completed - Discussed with patient verbally. PROVIDER DISCUSSED WITH PATIENT. - - - -    Fall Risk Fall Risk  11/27/2020 12/18/2019 11/27/2019 07/23/2018 06/21/2018  Falls in the past year? 0 0 0 1 0  Number falls in past yr: 0 0 - 0 -  Comment - - - patient lost her balance.  denies injury -  Injury with Fall? 0 0 - 0 -  Follow up Falls evaluation completed Falls evaluation completed Falls evaluation completed - -    FALL RISK PREVENTION PERTAINING TO THE HOME: Handrails in use when climbing stairs? Yes Home free of loose throw rugs in walkways, pet beds, electrical cords, etc? Yes  Adequate lighting in your home to reduce  risk of falls? Yes   ASSISTIVE DEVICES UTILIZED TO PREVENT FALLS: Life alert? No  Use of a cane, walker or w/c? Yes  Grab bars in the bathroom? Yes   Shower chair or bench in shower? Yes  Elevated toilet seat or a handicapped toilet? Yes   TIMED UP AND GO: Was the test performed? Yes .  Length of time to ambulate 10 feet: 20 sec.   Gait slow and steady with assistive device  Cognitive Function:  Patient is alert and oriented x3   Enjoys scrabble, word games for brain health.    6CIT Screen 11/27/2019  What Year? 0 points  What month? 0 points  Months in reverse 0 points  Repeat phrase 0 points    Immunizations Immunization History  Administered Date(s) Administered  . Fluad Quad(high Dose 65+) 04/08/2019, 03/30/2020  . Influenza Inj Mdck Quad Pf 04/03/2017  . Influenza,inj,Quad PF,6+ Mos 04/14/2016, 03/30/2018  . Influenza-Unspecified 03/07/2019  . PFIZER(Purple Top)SARS-COV-2 Vaccination  08/23/2019, 09/18/2019, 06/02/2020  . Pneumococcal Conjugate-13 03/30/2018  . Pneumococcal Polysaccharide-23 04/18/2019  . Pneumococcal-Unspecified 03/23/2012  . Td 11/01/2012  . Zoster 10/07/2012    Health Maintenance  Health Maintenance  Topic Date Due  . MAMMOGRAM  03/08/2020  . COVID-19 Vaccine (4 - Booster for Pfizer series) 12/13/2020 (Originally 09/02/2020)  . INFLUENZA VACCINE  02/08/2021  . TETANUS/TDAP  11/02/2022  . COLONOSCOPY (Pts 45-105yrs Insurance coverage will need to be confirmed)  05/14/2023  . DEXA SCAN  Completed  . Hepatitis C Screening  Completed  . PNA vac Low Risk Adult  Completed  . HPV VACCINES  Aged Out    Colorectal cancer screening: Type of screening: Colonoscopy. Completed 05/13/20. Repeat every 3 years  Mammogram status: Completed 03/08/18. Repeat every year. Ordered 08/07/20.  Bone density- deferred per patient.   DG Chest 2 view: completed 2021.  Vision Screening: Recommended annual ophthalmology exams for early detection of glaucoma and  other disorders of the eye. Is the patient up to date with their annual eye exam?  Yes   Dental Screening: Recommended annual dental exams for proper oral hygiene.  Community Resource Referral / Chronic Care Management: CRR required this visit?  No   CCM required this visit?  No      Plan:   Keep all routine maintenance appointments.   I have personally reviewed and noted the following in the patient's chart:   . Medical and social history . Use of alcohol, tobacco or illicit drugs  . Current medications and supplements including opioid prescriptions. Patient is currently not taking opioids . Functional ability and status . Nutritional status . Physical activity . Advanced directives . List of other physicians . Hospitalizations, surgeries, and ER visits in previous 12 months . Vitals . Screenings to include cognitive, depression, and falls . Referrals and appointments  In addition, I have reviewed and discussed with patient certain preventive protocols, quality metrics, and best practice recommendations. A written personalized care plan for preventive services as well as general preventive health recommendations were provided to patient.     Varney Biles, LPN   [id number]     Agree with plan. [name], NP

## 2020-12-02 ENCOUNTER — Other Ambulatory Visit: Payer: Self-pay

## 2020-12-02 ENCOUNTER — Encounter: Payer: Self-pay | Admitting: Family

## 2020-12-02 ENCOUNTER — Ambulatory Visit (INDEPENDENT_AMBULATORY_CARE_PROVIDER_SITE_OTHER): Payer: Medicare Other | Admitting: Family

## 2020-12-02 ENCOUNTER — Ambulatory Visit (INDEPENDENT_AMBULATORY_CARE_PROVIDER_SITE_OTHER): Payer: Medicare Other

## 2020-12-02 VITALS — BP 122/82 | HR 86 | Temp 97.6°F | Ht 65.0 in | Wt 304.2 lb

## 2020-12-02 DIAGNOSIS — M79602 Pain in left arm: Secondary | ICD-10-CM | POA: Insufficient documentation

## 2020-12-02 DIAGNOSIS — M19012 Primary osteoarthritis, left shoulder: Secondary | ICD-10-CM | POA: Diagnosis not present

## 2020-12-02 DIAGNOSIS — M7989 Other specified soft tissue disorders: Secondary | ICD-10-CM | POA: Diagnosis not present

## 2020-12-02 NOTE — Progress Notes (Signed)
Subjective:    Patient ID: Amber Barnes, female    DOB: [birth year], 70 y.o.   MRN: [id number]  CC: Amber Barnes is a 70 y.o. female who presents today for acute visit  HPI: Complains of left upper arm pain and enlargement for a year, over the past month, it has enlarged.   Pain with movement and at rest.  Intermittent numbness in distal left fingertips which extends into hand.  NO neck pain, HA, vision changes, rash, chills,  fever, tick bite, weakness.   She thought she may have injured her arm Pain is worse with movement such as driving. Pain improves at night when she is resting supine . She doesn't sleep on right arm.   Using voltaren gel with temporary relief.   She is right  handed. She plans golf left handed however she hasnt played golf recently.   No injury   Notes she had covid vaccine 09/2019. She felt a 'knot' at that time.    Left mid back cyst, which has been years. Unchanged. Can be bothersome with bra. Not enlarged   No h/o cancer Mammogram due    History of atrial fibrillation- she is compliant with eliquis 5mg  bid, bisoprolol 5mg  . No cp, palpitations.  HISTORY:  Past Medical History:  Diagnosis Date  . Achilles tendinitis   . Acute medial meniscal tear   . Allergy   . Anxiety   . Arthritis   . Atrial fibrillation ([place])    a. new onset 07/2016; b. CHADS2VASc --> 2 (HTN, female); c. eliquis  . CHF (congestive heart failure) ([place])   . Chronic lower back pain   . Colon polyp   . COPD (chronic obstructive pulmonary disease) ([place])   . Cystic breast   . Depression   . Dysrhythmia    ATRIAL FIB  . Endometriosis   . Fibrocystic breast disease   . GERD (gastroesophageal reflux disease)   . Hyperlipidemia   . Hypertension   . Insomnia   . Lipoma   . Migraine   . Osteoarthritis    left knee  . Osteopenia   . Sleep apnea    does not use her cpap  . Tendonitis   . Thyroid disease   . Vitamin D deficiency    Past Surgical History:   Procedure Laterality Date  . ABDOMINAL HYSTERECTOMY     ovaries left  . ACHILLES TENDON SURGERY     2012,01/2017  . BACK SURGERY    . CARDIOVERSION N/A 09/07/2016   Procedure: CARDIOVERSION;  Surgeon: Minna Merritts, MD;  Location: [facility];  Service: Cardiovascular;  Laterality: N/A;  . CARDIOVERSION N/A 10/19/2016   Procedure: CARDIOVERSION;  Surgeon: Minna Merritts, MD;  Location: [facility];  Service: Cardiovascular;  Laterality: N/A;  . CARDIOVERSION N/A 12/14/2016   Procedure: CARDIOVERSION;  Surgeon: Minna Merritts, MD;  Location: [facility];  Service: Cardiovascular;  Laterality: N/A;  . COLONOSCOPY    . COLONOSCOPY WITH PROPOFOL N/A 05/13/2020   Procedure: COLONOSCOPY WITH PROPOFOL;  Surgeon: Toledo, Benay Pike, MD;  Location: [facility];  Service: Gastroenterology;  Laterality: N/A;  ELIQUIS  . ESOPHAGOGASTRODUODENOSCOPY    . ESOPHAGOGASTRODUODENOSCOPY (EGD) WITH PROPOFOL N/A 05/13/2020   Procedure: ESOPHAGOGASTRODUODENOSCOPY (EGD) WITH PROPOFOL;  Surgeon: Toledo, Benay Pike, MD;  Location: [facility];  Service: Gastroenterology;  Laterality: N/A;  . FOOT SURGERY    . JOINT REPLACEMENT    . THYROIDECTOMY     thyroid lobectomy secondary to a benign nodule  .  TOTAL HIP ARTHROPLASTY Right 08/21/2018   Procedure: TOTAL HIP ARTHROPLASTY ANTERIOR APPROACH-RIGHT CELL SAVER REQUESTED;  Surgeon: [name], MD;  Location: [facility];  Service: Orthopedics;  Laterality: Right;   Family History  Problem Relation Age of Onset  . Cancer Sister 51       ovarian  . Depression Sister   . Cancer Brother        CLL  . Cancer Father 71       colon  . Heart disease Father   . Heart attack Father 65       died from MI  . Hyperlipidemia Father   . Hypertension Father   . Cancer Mother        leukemia  . Breast cancer Paternal Aunt        65's  . Migraines Neg Hx     Allergies: Prednisone, Hydrocodone-acetaminophen, Oxycodone, Tapentadol, Tramadol, Adhesive [tape], and  Latex Current Outpatient Medications on File Prior to Visit  Medication Sig Dispense Refill  . acetaminophen (TYLENOL) 500 MG tablet Take 500 mg by mouth every 6 (six) hours as needed. Taking as needed    . albuterol (VENTOLIN HFA) 108 (90 Base) MCG/ACT inhaler INHALE 2 PUFFS INTO THE LUNGS EVERY 8 (EIGHT) HOURS AS NEEDED FOR WHEEZING OR SHORTNESS OF BREATH. 6.7 g 0  . ALPRAZolam (XANAX) 0.25 MG tablet TAKE 1 TABLET BY MOUTH DAILY AS NEEDED FOR ANXIETY OR SLEEP. 30 tablet 1  . azelastine (ASTELIN) 0.1 % nasal spray PLACE 1 SPRAY INTO BOTH NOSTRILS 2 (TWO) TIMES DAILY. USE IN EACH NOSTRIL AS DIRECTED 30 mL 1  . B Complex Vitamins (VITAMIN B COMPLEX PO) Take 1 tablet by mouth daily.     . bisoprolol (ZEBETA) 5 MG tablet Take 1 tablet (5 mg total) by mouth as directed. Take one tablet in the AM and may take extra pill in PM if needed for heart rate (Patient taking differently: Take 5 mg by mouth 2 (two) times daily. Take one tablet in the AM and may take extra pill in PM if needed for heart rate) 180 tablet 3  . busPIRone (BUSPAR) 5 MG tablet TAKE 1 TABLET BY MOUTH THREE TIMES A DAY AS NEEDED 60 tablet 1  . calcium gluconate 500 MG tablet Take 500 tablets by mouth daily.    . calcium-vitamin D (OSCAL WITH D) 500-200 MG-UNIT tablet Take 1 tablet by mouth daily with breakfast.    . Cholecalciferol (VITAMIN D3) 5000 units CAPS Take 5,000 Units by mouth daily.    Marland Kitchen ELIQUIS 5 MG TABS tablet TAKE 1 TABLET BY MOUTH TWICE A DAY 180 tablet 1  . esomeprazole (NEXIUM) 20 MG capsule Take 20 mg by mouth 2 (two) times daily.    . furosemide (LASIX) 20 MG tablet TAKE 1 TABLET BY MOUTH TWICE A DAY 180 tablet 0  . hydrochlorothiazide (MICROZIDE) 12.5 MG capsule TAKE 1 CAPSULE BY MOUTH EVERY DAY 90 capsule 2  . losartan (COZAAR) 100 MG tablet Take 1 tablet (100 mg total) by mouth daily. PLEASE CALL OFFICE TO SCHEDULE AN APPOINTMENT FOR FURTHER REFILLS 90 tablet 0  . Multiple Vitamin (MULTIVITAMIN) tablet Take 1 tablet  by mouth daily.    . potassium chloride (KLOR-CON) 10 MEQ tablet TAKE 1 TABLET TWICE DAILY. PLEASE MAKE APPT FOR FUTURE REFILLS. 180 tablet 0  . ranolazine (RANEXA) 500 MG 12 hr tablet TAKE 1 TABLET BY MOUTH TWICE A DAY 180 tablet 3  . rizatriptan (MAXALT) 10 MG tablet TAKE 1 TABLET (  10 MG TOTAL) BY MOUTH AS NEEDED FOR MIGRAINE. MAY REPEAT IN 2 HOURS IF NEEDED 10 tablet 0  . rOPINIRole (REQUIP) 1 MG tablet Take 1 tablet (1 mg total) by mouth at bedtime. 90 tablet 1  . rosuvastatin (CRESTOR) 20 MG tablet TAKE 1 TABLET BY MOUTH EVERY DAY 90 tablet 0  . sertraline (ZOLOFT) 100 MG tablet TAKE 1 AND 1/2 TABLETS BY MOUTH ONCE DAILY 135 tablet 2  . Clotrimazole 1 % OINT Apply 1 application topically 2 (two) times daily. 56.7 g 3   No current facility-administered medications on file prior to visit.    Social History   Tobacco Use  . Smoking status: Former Smoker    Packs/day: 0.25    Years: 10.00    Pack years: 2.50    Types: Cigarettes    Quit date: 2005    Years since quitting: 17.4  . Smokeless tobacco: Never Used  . Tobacco comment: smoked for 10 years, 1 pack every 2-3 days  Vaping Use  . Vaping Use: Never used  Substance Use Topics  . Alcohol use: Not Currently    Comment: social, occasional  . Drug use: No    Review of Systems  Constitutional: Negative for chills and fever.  Respiratory: Negative for cough.   Cardiovascular: Negative for chest pain and palpitations.  Gastrointestinal: Negative for nausea and vomiting.  Musculoskeletal: Positive for joint swelling. Negative for back pain and neck pain.  Skin: Negative for rash and wound.  Neurological: Positive for numbness.      Objective:    BP 122/82 (BP Location: Right Arm, Patient Position: Sitting, Cuff Size: Large)   Pulse 86   Temp 97.6 F (36.4 C) (Oral)   Ht 5\' 5"  (1.651 m)   Wt (!) 304 lb 3.2 oz (138 kg)   SpO2 96%   BMI 50.62 kg/m  BP Readings from Last 3 Encounters:  12/02/20 122/82  11/27/20 105/69   09/09/20 118/80   Wt Readings from Last 3 Encounters:  12/02/20 (!) 304 lb 3.2 oz (138 kg)  11/27/20 (!) 302 lb 9.6 oz (137.3 kg)  09/09/20 (!) 304 lb 6.4 oz (138.1 kg)    Physical Exam Vitals reviewed.  Constitutional:      Appearance: She is well-developed.  Eyes:     Conjunctiva/sclera: Conjunctivae normal.  Cardiovascular:     Rate and Rhythm: Normal rate and regular rhythm.     Pulses: Normal pulses.     Heart sounds: Normal heart sounds.  Pulmonary:     Effort: Pulmonary effort is normal.     Breath sounds: Normal breath sounds. No wheezing, rhonchi or rales.  Musculoskeletal:     Right shoulder: No swelling, deformity or tenderness. Normal range of motion.     Left shoulder: Normal. No tenderness. Normal range of motion.     Right upper arm: No swelling.     Left upper arm: Tenderness present. No bony tenderness.     Left elbow: Normal. No swelling.       Arms:     Comments: Dippling and tenderness noted left triceps. No palpable mass. No erythema, rash, increased heat.   Skin:    General: Skin is warm and dry.          Comments: Nontender, nonfluctuant soft tissue well discriminated mass left mid thoracic back.   Neurological:     Mental Status: She is alert.  Psychiatric:        Speech: Speech normal.  Behavior: Behavior normal.        Thought Content: Thought content normal.        Assessment & Plan:   Problem List Items Addressed This Visit      Other   Left arm pain - Primary    Differentials include DVT, lymphedema, lipoma. Xr humerus unrevealing for bony lesion. Pending [place] LUE. She is compliant with eliquis 5mg  bid. Consider CT humerus if [place] unrevealing.  Case reviewed with supervising, Dr Deborra Medina, and she and I jointly agreed on management plan.        Relevant Orders   DG Humerus Left (Completed)   Soft tissue mass    Presentation consistent with lipoma. Pending dedicated US.       Relevant Orders   [place] MiscellaneoUS  Localization       I am having [name] "[name]" maintain her multivitamin, B Complex Vitamins (VITAMIN B COMPLEX PO), Vitamin D3, calcium gluconate, rizatriptan, albuterol, acetaminophen, bisoprolol, sertraline, esomeprazole, calcium-vitamin D, ranolazine, azelastine, rOPINIRole, potassium chloride, hydrochlorothiazide, Clotrimazole, furosemide, ALPRAZolam, busPIRone, losartan, Eliquis, and rosuvastatin.   No orders of the defined types were placed in this encounter.   Return precautions given.   Risks, benefits, and alternatives of the medications and treatment plan prescribed today were discussed, and patient expressed understanding.   Education regarding symptom management and diagnosis given to patient on AVS.  Continue to follow with [name], FNP for routine health maintenance.   [name] and I agreed with plan.   [name], FNP

## 2020-12-02 NOTE — Patient Instructions (Addendum)
Xray today of left arm  Ultrasound of mass left mid back  Let us know if you dont hear back within a week in regards to an appointment being scheduled.   Please let me know of any concerns

## 2020-12-03 ENCOUNTER — Telehealth: Payer: Self-pay | Admitting: Family

## 2020-12-03 ENCOUNTER — Other Ambulatory Visit: Payer: Self-pay | Admitting: Family

## 2020-12-03 DIAGNOSIS — M79602 Pain in left arm: Secondary | ICD-10-CM

## 2020-12-03 DIAGNOSIS — Z1231 Encounter for screening mammogram for malignant neoplasm of breast: Secondary | ICD-10-CM

## 2020-12-03 DIAGNOSIS — M7989 Other specified soft tissue disorders: Secondary | ICD-10-CM

## 2020-12-03 NOTE — Assessment & Plan Note (Signed)
Differentials include DVT, lymphedema, lipoma. Xr humerus unrevealing for bony lesion. Pending [place] LUE. She is compliant with eliquis 5mg  bid. Consider CT humerus if [place] unrevealing.  Case reviewed with supervising, Dr Deborra Medina, and she and I jointly agreed on management plan.

## 2020-12-03 NOTE — Assessment & Plan Note (Signed)
Presentation consistent with lipoma. Pending dedicated US.

## 2020-12-03 NOTE — Telephone Encounter (Signed)
lft vm for pt to call ofc to sch [place] and Vas. I also left on pt vm that I wil be back from lunch at 1:40 pm. thanks

## 2020-12-04 ENCOUNTER — Telehealth: Payer: Self-pay | Admitting: Family

## 2020-12-04 NOTE — Telephone Encounter (Signed)
lft pt a vm to call ofc to sch [place]. thanks

## 2020-12-08 ENCOUNTER — Ambulatory Visit
Admission: RE | Admit: 2020-12-08 | Discharge: 2020-12-08 | Disposition: A | Discharge status: Home / Self Care | Payer: Medicare Other | Source: Ambulatory Visit | Attending: Family | Admitting: Family

## 2020-12-08 ENCOUNTER — Other Ambulatory Visit: Payer: Self-pay | Admitting: Family

## 2020-12-08 ENCOUNTER — Other Ambulatory Visit: Payer: Self-pay

## 2020-12-08 ENCOUNTER — Ambulatory Visit (INDEPENDENT_AMBULATORY_CARE_PROVIDER_SITE_OTHER): Payer: Medicare Other

## 2020-12-08 DIAGNOSIS — M79602 Pain in left arm: Secondary | ICD-10-CM

## 2020-12-08 DIAGNOSIS — M7989 Other specified soft tissue disorders: Secondary | ICD-10-CM | POA: Diagnosis not present

## 2020-12-08 DIAGNOSIS — D1779 Benign lipomatous neoplasm of other sites: Secondary | ICD-10-CM | POA: Diagnosis not present

## 2020-12-08 DIAGNOSIS — D171 Benign lipomatous neoplasm of skin and subcutaneous tissue of trunk: Secondary | ICD-10-CM | POA: Diagnosis not present

## 2020-12-10 ENCOUNTER — Other Ambulatory Visit: Payer: Self-pay | Admitting: Cardiovascular Disease

## 2020-12-10 ENCOUNTER — Other Ambulatory Visit: Payer: Self-pay | Admitting: Family

## 2020-12-10 DIAGNOSIS — I4819 Other persistent atrial fibrillation: Secondary | ICD-10-CM

## 2020-12-10 DIAGNOSIS — F419 Anxiety disorder, unspecified: Secondary | ICD-10-CM

## 2020-12-10 NOTE — Telephone Encounter (Signed)
Please schedule overdue F/U appointment. Thank you! ?

## 2020-12-10 NOTE — Telephone Encounter (Signed)
LMOV  

## 2020-12-11 ENCOUNTER — Ambulatory Visit: Payer: Medicare Other | Admitting: Family

## 2020-12-11 ENCOUNTER — Encounter: Payer: Self-pay | Admitting: Family

## 2020-12-13 ENCOUNTER — Other Ambulatory Visit: Payer: Self-pay | Admitting: Cardiovascular Disease

## 2020-12-14 ENCOUNTER — Telehealth: Payer: Self-pay | Admitting: Family

## 2020-12-14 DIAGNOSIS — M79602 Pain in left arm: Secondary | ICD-10-CM

## 2020-12-14 NOTE — Telephone Encounter (Signed)
Attempted to schedule, LVM  

## 2020-12-14 NOTE — Telephone Encounter (Signed)
Rasheedah,  Referral to vascular placed How soon can she be seen?  Thank you!

## 2020-12-14 NOTE — Telephone Encounter (Signed)
Please schedule overdue 6 month F/U appointment. Thank you! °

## 2020-12-14 NOTE — Telephone Encounter (Signed)
Scheduled for 6/8

## 2020-12-16 ENCOUNTER — Other Ambulatory Visit: Payer: Self-pay

## 2020-12-16 ENCOUNTER — Ambulatory Visit: Payer: Medicare Other | Admitting: Nurse Practitioner

## 2020-12-16 ENCOUNTER — Encounter: Payer: Self-pay | Admitting: Nurse Practitioner

## 2020-12-16 VITALS — BP 108/70 | HR 52 | Ht 65.0 in | Wt 301.0 lb

## 2020-12-16 DIAGNOSIS — I480 Paroxysmal atrial fibrillation: Secondary | ICD-10-CM | POA: Diagnosis not present

## 2020-12-16 DIAGNOSIS — I10 Essential (primary) hypertension: Secondary | ICD-10-CM

## 2020-12-16 DIAGNOSIS — Z6841 Body Mass Index (BMI) 40.0 and over, adult: Secondary | ICD-10-CM

## 2020-12-16 DIAGNOSIS — E782 Mixed hyperlipidemia: Secondary | ICD-10-CM

## 2020-12-16 NOTE — Progress Notes (Signed)
Office Visit    Patient Name: Amber Barnes Date of Encounter: 12/16/2020  Primary Care Provider:  [name], FNP Primary Cardiologist:  Ida Rogue, MD  Chief Complaint    70 y/o ? w/ a h/o PAF, obesity, OA, HL, HTN, OSA on CPAP, migraines, H. pylori/peptic ulcer disease, and anxiety, who presents for f/u of PAF and HTN.  Past Medical History    Past Medical History:  Diagnosis Date  . Achilles tendinitis   . Acute medial meniscal tear   . Allergy   . Anxiety   . Arthritis   . Atrial fibrillation ([place])    a. new onset 07/2016; b. CHADS2VASc --> 2 (HTN, female); c. eliquis  . CHF (congestive heart failure) ([place])   . Chronic lower back pain   . Colon polyp   . COPD (chronic obstructive pulmonary disease) ([place])   . Cystic breast   . Depression   . Dysrhythmia    ATRIAL FIB  . Endometriosis   . Fibrocystic breast disease   . GERD (gastroesophageal reflux disease)   . History of stress test    a. 07/2016 MV: EF 55-65%. Small, mild defect  in mid anteroseptal, apical septal, and apical locations. No ischemia-->low risk.  Marland Kitchen Hyperlipidemia   . Hypertension   . Insomnia   . Lipoma   . Migraine   . Mild Mitral regurgitation    a. 07/2016 Echo: EF 55-65%, no rwma, mild MR. Mildly dil LA. Nl RV fx.   . Osteoarthritis    left knee  . Osteopenia   . Peptic ulcer disease    a. H. pylori  . Sleep apnea    does not use her cpap  . Tendonitis   . Thyroid disease   . Vitamin D deficiency    Past Surgical History:  Procedure Laterality Date  . ABDOMINAL HYSTERECTOMY     ovaries left  . ACHILLES TENDON SURGERY     2012,01/2017  . BACK SURGERY    . CARDIOVERSION N/A 09/07/2016   Procedure: CARDIOVERSION;  Surgeon: Minna Merritts, MD;  Location: [facility];  Service: Cardiovascular;  Laterality: N/A;  . CARDIOVERSION N/A 10/19/2016   Procedure: CARDIOVERSION;  Surgeon: Minna Merritts, MD;  Location: [facility];  Service: Cardiovascular;  Laterality: N/A;  .  CARDIOVERSION N/A 12/14/2016   Procedure: CARDIOVERSION;  Surgeon: Minna Merritts, MD;  Location: [facility];  Service: Cardiovascular;  Laterality: N/A;  . COLONOSCOPY    . COLONOSCOPY WITH PROPOFOL N/A 05/13/2020   Procedure: COLONOSCOPY WITH PROPOFOL;  Surgeon: Toledo, Benay Pike, MD;  Location: [facility];  Service: Gastroenterology;  Laterality: N/A;  ELIQUIS  . ESOPHAGOGASTRODUODENOSCOPY    . ESOPHAGOGASTRODUODENOSCOPY (EGD) WITH PROPOFOL N/A 05/13/2020   Procedure: ESOPHAGOGASTRODUODENOSCOPY (EGD) WITH PROPOFOL;  Surgeon: Toledo, Benay Pike, MD;  Location: [facility];  Service: Gastroenterology;  Laterality: N/A;  . FOOT SURGERY    . JOINT REPLACEMENT    . THYROIDECTOMY     thyroid lobectomy secondary to a benign nodule  . TOTAL HIP ARTHROPLASTY Right 08/21/2018   Procedure: TOTAL HIP ARTHROPLASTY ANTERIOR APPROACH-RIGHT CELL SAVER REQUESTED;  Surgeon: [name], MD;  Location: [facility];  Service: Orthopedics;  Laterality: Right;    Allergies  Allergies  Allergen Reactions  . Prednisone Other (See Comments)    Agitation, hyperactivity, polyphagia  . Amiodarone Nausea And Vomiting  . Hydrocodone-Acetaminophen Other (See Comments)    Hallucinations and combative  . Oxycodone Itching    Can take it with  benadryl  . Tapentadol Other (See Comments)  . Tramadol Itching    Can take it with benadryl  . Adhesive [Tape] Itching and Rash    Prefers paper tape  . Latex Itching and Rash    use paper tap    History of Present Illness    70 y/o ? with the above past medical history including paroxysmal atrial fibrillation, hypertension, hyperlipidemia, obesity, arthritis, sleep apnea, migraines, H. pylori/peptic ulcer disease and anxiety.  She was initially diagnosed with atrial fibrillation in January 2018.  Echocardiogram at that time showed normal LV function and mild mitral regurgitation.  Stress testing showed a small, mild defect in the mid anteroseptal, apical septal, and  apical locations.  This was felt to be low risk.  She subsequently underwent cardioversion in February 2018 and has been managed was managed with amiodarone, beta-blocker, and Eliquis.  She was seen in the emergency department in April 2021 with recurrent atrial fibrillation in the setting of intractable nausea, chest, epigastric, and abdominal pain.  Amiodarone was discontinued as it was felt to potentially be contributing to symptoms.  Beta-blocker dose was adjusted and at follow-up visit in May 2021, she was in sinus rhythm.  She was subsequently diagnosed with gastritis and H. pylori infection in the setting of ongoing GI difficulties and weight loss.  At her last clinic visit in November 2021, she was maintaining sinus rhythm.  She was hypertensive but subsequent home pressures were mostly stable and current regimen was continued.  In February, she contacted our office via [facility] secondary to a 4-day history of A. fib.  She was advised that she may take an additional bisoprolol and A. fib ended up resolving spontaneously.  Most recently, she was evaluated by primary care on May 25 secondary to complaints of left arm swelling, worse over the past month.  Left upper extremity ultrasound was negative for DVT.  There is concern for lymphedema and patient has been referred to vascular.  From a cardiac standpoint, she has been stable.  She has not experienced any atrial fibrillation since that episode in February.  She has chronic dyspnea on exertion in the setting of being very sedentary, which is secondary to chronic back and left hip pain.  She is able to ambulate around her home, using her cane without dyspnea but at higher levels of activity, she will have to rest more frequently.  She also has intermittent nausea, and has had more this week than usual.  She does not experience chest pain and denies palpitations, PND, orthopnea, dizziness, syncope, edema, or early satiety.  As noted above, she has noticed  some enlarging of the left bicep/tricep area with tenderness over the left lateral upper arm.  Home Medications    Prior to Admission medications   Medication Sig Start Date End Date Taking? Authorizing Provider  acetaminophen (TYLENOL) 500 MG tablet Take 500 mg by mouth every 6 (six) hours as needed. Taking as needed    [provider]  albuterol (VENTOLIN HFA) 108 (90 Base) MCG/ACT inhaler INHALE 2 PUFFS INTO THE LUNGS EVERY 8 (EIGHT) HOURS AS NEEDED FOR WHEEZING OR SHORTNESS OF BREATH. 04/04/19   [name], FNP  ALPRAZolam (XANAX) 0.25 MG tablet TAKE 1 TABLET BY MOUTH DAILY AS NEEDED FOR ANXIETY OR SLEEP. 11/13/20   [name], FNP  azelastine (ASTELIN) 0.1 % nasal spray PLACE 1 SPRAY INTO BOTH NOSTRILS 2 (TWO) TIMES DAILY. USE IN [facility] NOSTRIL AS DIRECTED 08/05/20   [name]  G, FNP  B Complex Vitamins (VITAMIN B COMPLEX PO) Take 1 tablet by mouth daily.     [provider]  bisoprolol (ZEBETA) 5 MG tablet TAKE 1 TABLET BY MOUTH IN THE MORNING AND MAY TAKE AN EXTRA IN THE EVENING IF NEEDED FOR HEART RATE 12/14/20   Minna Merritts, MD  busPIRone (BUSPAR) 5 MG tablet TAKE 1 TABLET BY MOUTH THREE TIMES A DAY AS NEEDED 11/16/20   [name], FNP  calcium gluconate 500 MG tablet Take 500 tablets by mouth daily.    [provider]  calcium-vitamin D (OSCAL WITH D) 500-200 MG-UNIT tablet Take 1 tablet by mouth daily with breakfast.    [provider]  Cholecalciferol (VITAMIN D3) 5000 units CAPS Take 5,000 Units by mouth daily.    [provider]  Clotrimazole 1 % OINT Apply 1 application topically 2 (two) times daily. 09/09/20   [name], FNP  ELIQUIS 5 MG TABS tablet TAKE 1 TABLET BY MOUTH TWICE A DAY 11/25/20   Minna Merritts, MD  esomeprazole (NEXIUM) 20 MG capsule Take 20 mg by mouth 2 (two) times daily. 03/23/20   [provider]  furosemide (LASIX) 20 MG tablet TAKE 1 TABLET BY MOUTH TWICE A DAY 12/15/20    Minna Merritts, MD  hydrochlorothiazide (MICROZIDE) 12.5 MG capsule TAKE 1 CAPSULE BY MOUTH EVERY DAY 08/31/20   Loel Dubonnet, NP  losartan (COZAAR) 100 MG tablet Take 1 tablet (100 mg total) by mouth daily. PLEASE CALL OFFICE TO SCHEDULE AN APPOINTMENT FOR FURTHER REFILLS 11/23/20   Minna Merritts, MD  Multiple Vitamin (MULTIVITAMIN) tablet Take 1 tablet by mouth daily.    [provider]  potassium chloride (KLOR-CON) 10 MEQ tablet TAKE 1 TABLET TWICE DAILY. PLEASE MAKE APPT FOR FUTURE REFILLS. 08/14/20   Loel Dubonnet, NP  ranolazine (RANEXA) 500 MG 12 hr tablet TAKE 1 TABLET BY MOUTH TWICE A DAY 06/17/20   Gollan, [name], MD  rizatriptan (MAXALT) 10 MG tablet TAKE 1 TABLET (10 MG TOTAL) BY MOUTH AS NEEDED FOR MIGRAINE. MAY REPEAT IN 2 HOURS IF NEEDED 03/13/18   Melvenia Beam, MD  rOPINIRole (REQUIP) 1 MG tablet Take 1 tablet (1 mg total) by mouth at bedtime. 08/07/20   [name], FNP  rosuvastatin (CRESTOR) 20 MG tablet TAKE 1 TABLET BY MOUTH EVERY DAY 11/25/20   [name], FNP  sertraline (ZOLOFT) 100 MG tablet TAKE 1 AND 1/2 TABLETS BY MOUTH ONCE DAILY 12/10/20   [name], FNP    Review of Systems    No recurrent atrial fibrillation since February.  She has chronic dyspnea on exertion.  She is somewhat unsteady on her feet but uses a cane and does not fall.  She denies chest pain, palpitations, PND, orthopnea, dizziness, syncope, edema, or early satiety.  She has been having left upper extremity tenderness.  All other systems reviewed and are otherwise negative except as noted above.  Physical Exam    VS:  BP 108/70 (BP Location: Right Arm, Patient Position: Sitting, Cuff Size: Large)   Pulse (!) 52   Ht 5\' 5"  (1.651 m)   Wt (!) 301 lb (136.5 kg)   SpO2 90%   BMI 50.09 kg/m  , BMI Body mass index is 50.09 kg/m. GEN: Obese, in no acute distress. HEENT: normal. Neck: Supple, no JVD, carotid bruits, or masses. Cardiac: RRR, no murmurs,  rubs, or gallops. No clubbing, cyanosis, edema.  Radials/PT 2+ and equal bilaterally.  Respiratory:  Respirations regular and unlabored, clear to auscultation bilaterally. GI: Obese, soft, nontender, nondistended, BS + x 4. MS: The left upper arm does appear slightly larger than the right upper arm.  No edema or erythema, though the area over the left lateral upper arm is tender.  Good distal pulses. Skin: warm and dry, no rash. Neuro:  Strength and sensation are intact. Psych: Normal affect.  Accessory Clinical Findings    ECG personally reviewed by me today -sinus bradycardia, 52, leftward axis, LVH, ?  Prior anterolateral infarct - no acute changes.  Lab Results  Component Value Date   WBC 6.2 03/31/2020   HGB 14.2 03/31/2020   HCT 42.2 03/31/2020   MCV 89.0 03/31/2020   PLT 188 03/31/2020   Lab Results  Component Value Date   CREATININE 0.89 09/22/2020   BUN 16 09/22/2020   NA 141 09/22/2020   K 4.0 09/22/2020   CL 100 09/22/2020   CO2 30 09/22/2020   Lab Results  Component Value Date   ALT 22 03/25/2020   AST 20 03/25/2020   ALKPHOS 79 03/25/2020   BILITOT 0.7 03/25/2020   Lab Results  Component Value Date   CHOL 190 03/25/2020   HDL 69.30 03/25/2020   LDLCALC 96 03/25/2020   TRIG 122.0 03/25/2020   CHOLHDL 3 03/25/2020    Lab Results  Component Value Date   HGBA1C 5.2 03/25/2020    Assessment & Plan    1.  Paroxysmal atrial fibrillation: Maintaining sinus rhythm without recurrent palpitations since February.  She remains on Eliquis and bisoprolol therapy.  Normal basic metabolic panel in March.  Due for follow-up CBC.  Will defer until annual visit with PCP in September.  2.  Essential hypertension: Stable at 108/70 on beta-blocker, ARB, and diuretic.  3.  Hyperlipidemia: LDL of 96 in September 2021.  She remains on statin therapy.  4.  Morbid obesity: Activity is very limited in the setting chronic back and left hip pain.  5.  Left upper arm  tenderness: This has been going on for few months.  She recently had an ultrasound which was negative for DVT.  She has good distal pulses.  The left upper arm might be slightly larger than the right though there is no pitting edema or erythema.  Management per primary care.  6.  Gastritis/nausea: She continues to have intermittent nausea.  She remains on PPI therapy.  7.  Disposition: Follow-up in 6 months or sooner if necessary.  Murray Hodgkins, NP 12/16/2020, 10:26 AM

## 2020-12-16 NOTE — Progress Notes (Signed)
Pt mammogram scheduled & patient aware.

## 2020-12-16 NOTE — Patient Instructions (Signed)
Medication Instructions:  No changes at this time.  *If you need a refill on your cardiac medications before your next appointment, please call your pharmacy*   Lab Work: None  If you have labs (blood work) drawn today and your tests are completely normal, you will receive your results only by: Marland Kitchen MyChart Message (if you have MyChart) OR . A paper copy in the mail If you have any lab test that is abnormal or we need to change your treatment, we will call you to review the results.   Testing/Procedures: None   Follow-Up: At [facility], you and your health needs are our priority.  As part of our continuing mission to provide you with exceptional heart care, we have created designated Provider Care Teams.  These Care Teams include your primary Cardiologist (physician) and Advanced Practice Providers (APPs -  Physician Assistants and Nurse Practitioners) who all work together to provide you with the care you need, when you need it.   Your next appointment:   6 month(s)  The format for your next appointment:   In Person  Provider:   Ida Rogue, MD or Murray Hodgkins, NP

## 2020-12-18 ENCOUNTER — Telehealth: Payer: Self-pay | Admitting: Family

## 2020-12-18 NOTE — Telephone Encounter (Signed)
What is status of vascular referral?

## 2020-12-30 ENCOUNTER — Other Ambulatory Visit: Payer: Self-pay

## 2020-12-30 ENCOUNTER — Ambulatory Visit
Admission: RE | Admit: 2020-12-30 | Discharge: 2020-12-30 | Disposition: A | Discharge status: Home / Self Care | Payer: Medicare Other | Source: Ambulatory Visit | Attending: Family | Admitting: Family

## 2020-12-30 DIAGNOSIS — Z1231 Encounter for screening mammogram for malignant neoplasm of breast: Secondary | ICD-10-CM

## 2020-12-31 ENCOUNTER — Ambulatory Visit (INDEPENDENT_AMBULATORY_CARE_PROVIDER_SITE_OTHER): Payer: Medicare Other | Admitting: Vascular Surgery

## 2020-12-31 ENCOUNTER — Other Ambulatory Visit: Payer: Self-pay | Admitting: Family

## 2020-12-31 ENCOUNTER — Encounter (INDEPENDENT_AMBULATORY_CARE_PROVIDER_SITE_OTHER): Payer: Self-pay | Admitting: Vascular Surgery

## 2020-12-31 VITALS — BP 136/87 | HR 62 | Resp 16 | Ht 65.5 in | Wt 295.0 lb

## 2020-12-31 DIAGNOSIS — M79602 Pain in left arm: Secondary | ICD-10-CM | POA: Diagnosis not present

## 2020-12-31 DIAGNOSIS — I480 Paroxysmal atrial fibrillation: Secondary | ICD-10-CM

## 2020-12-31 DIAGNOSIS — I10 Essential (primary) hypertension: Secondary | ICD-10-CM | POA: Diagnosis not present

## 2020-12-31 DIAGNOSIS — M8949 Other hypertrophic osteoarthropathy, multiple sites: Secondary | ICD-10-CM

## 2020-12-31 DIAGNOSIS — N6310 Unspecified lump in the right breast, unspecified quadrant: Secondary | ICD-10-CM

## 2020-12-31 DIAGNOSIS — R928 Other abnormal and inconclusive findings on diagnostic imaging of breast: Secondary | ICD-10-CM

## 2020-12-31 DIAGNOSIS — N6489 Other specified disorders of breast: Secondary | ICD-10-CM

## 2020-12-31 DIAGNOSIS — M159 Polyosteoarthritis, unspecified: Secondary | ICD-10-CM

## 2021-01-01 ENCOUNTER — Telehealth: Payer: Self-pay

## 2021-01-01 NOTE — Telephone Encounter (Signed)
I called patient after seeing DIL & she stated that patient had not got call after having abnormal mammogram. She read mychart report stating that further imaging was needed of right breast. I advised patient it looks like appropriate orders had been placed & that [facility] should call her within a week of exam to be scheduled for diagnostic mammo & [place]. I advised if worried she could try to call to be scheduled sooner, but she said that she knew they would call & schedule when they could. She also sees [name] this coming Wednesday as well. She said that years ago she had cysts in her breast that showed up on her mammogram & hopes that it is just something benign such as these cysts.

## 2021-01-01 NOTE — Telephone Encounter (Signed)
You are right. Patient requires Diagnostic mammogram and possibly ultrasound of the right breast.  Orders in place.   Please ask patient to call [facility] if not heard from in a couple of days

## 2021-01-01 NOTE — Telephone Encounter (Signed)
Pt advised of this.

## 2021-01-04 ENCOUNTER — Encounter (INDEPENDENT_AMBULATORY_CARE_PROVIDER_SITE_OTHER): Payer: Self-pay | Admitting: Vascular Surgery

## 2021-01-04 NOTE — Progress Notes (Signed)
MRN : [id number]  Amber Barnes is a 70 y.o. ([birth year]) female who presents with chief complaint of  Chief Complaint  Patient presents with   New Patient (Initial Visit)    Ref Arnett left arm pain/lymphedema  .  History of Present Illness:  The patient is seen for evaluation of painful left upper extremities. Patient notes the pain is variable and not always associated with activity.  The pain is somewhat consistent day to day occurring on most days. The patient notes the pain also occurs with moving her arm and routinely seems worse as the day wears on. The pain has been progressive over the past several months. The patient states these symptoms are causing  a profound negative impact on quality of life and daily activities.  The patient denies rest pain or dangling of her upper extremity off the side of the bed during the night for relief. No open wounds or sores at this time. No history of DVT or phlebitis. No prior interventions or surgeries.  There is a  history of back problems and DJD of the lumbar and sacral spine but to her knowledge she has never investigated her cervical spine.     Current Meds  Medication Sig   acetaminophen (TYLENOL) 500 MG tablet Take 500 mg by mouth every 6 (six) hours as needed. Taking as needed   albuterol (VENTOLIN HFA) 108 (90 Base) MCG/ACT inhaler INHALE 2 PUFFS INTO THE LUNGS EVERY 8 (EIGHT) HOURS AS NEEDED FOR WHEEZING OR SHORTNESS OF BREATH.   ALPRAZolam (XANAX) 0.25 MG tablet TAKE 1 TABLET BY MOUTH DAILY AS NEEDED FOR ANXIETY OR SLEEP.   azelastine (ASTELIN) 0.1 % nasal spray Place into both nostrils as needed for rhinitis. Use in each nostril as directed   B Complex Vitamins (VITAMIN B COMPLEX PO) Take 1 tablet by mouth daily.    bisoprolol (ZEBETA) 5 MG tablet TAKE 1 TABLET BY MOUTH IN THE MORNING AND MAY TAKE AN EXTRA IN THE EVENING IF NEEDED FOR HEART RATE   busPIRone (BUSPAR) 5 MG tablet TAKE 1 TABLET BY MOUTH THREE TIMES A DAY AS  NEEDED   calcium gluconate 500 MG tablet Take 500 tablets by mouth daily.   calcium-vitamin D (OSCAL WITH D) 500-200 MG-UNIT tablet Take 1 tablet by mouth daily with breakfast.   Cholecalciferol (VITAMIN D3) 5000 units CAPS Take 5,000 Units by mouth daily.   ELIQUIS 5 MG TABS tablet TAKE 1 TABLET BY MOUTH TWICE A DAY   esomeprazole (NEXIUM) 20 MG capsule Take 20 mg by mouth as needed.   furosemide (LASIX) 20 MG tablet TAKE 1 TABLET BY MOUTH TWICE A DAY   hydrochlorothiazide (MICROZIDE) 12.5 MG capsule TAKE 1 CAPSULE BY MOUTH EVERY DAY   losartan (COZAAR) 100 MG tablet Take 1 tablet (100 mg total) by mouth daily. PLEASE CALL OFFICE TO SCHEDULE AN APPOINTMENT FOR FURTHER REFILLS   Multiple Vitamin (MULTIVITAMIN) tablet Take 1 tablet by mouth daily.   potassium chloride (KLOR-CON) 10 MEQ tablet TAKE 1 TABLET TWICE DAILY. PLEASE MAKE APPT FOR FUTURE REFILLS.   ranolazine (RANEXA) 500 MG 12 hr tablet TAKE 1 TABLET BY MOUTH TWICE A DAY   rizatriptan (MAXALT) 10 MG tablet TAKE 1 TABLET (10 MG TOTAL) BY MOUTH AS NEEDED FOR MIGRAINE. MAY REPEAT IN 2 HOURS IF NEEDED   rOPINIRole (REQUIP) 1 MG tablet Take 1 tablet (1 mg total) by mouth at bedtime.   rosuvastatin (CRESTOR) 20 MG tablet TAKE 1 TABLET BY MOUTH EVERY DAY  sertraline (ZOLOFT) 100 MG tablet TAKE 1 AND 1/2 TABLETS BY MOUTH ONCE DAILY    Past Medical History:  Diagnosis Date   Achilles tendinitis    Acute medial meniscal tear    Allergy    Anxiety    Arthritis    Atrial fibrillation ([place])    a. new onset 07/2016; b. CHADS2VASc --> 2 (HTN, female); c. eliquis   CHF (congestive heart failure) (HCC)    Chronic lower back pain    Colon polyp    COPD (chronic obstructive pulmonary disease) ([place])    Cystic breast    Depression    Dysrhythmia    ATRIAL FIB   Endometriosis    Fibrocystic breast disease    GERD (gastroesophageal reflux disease)    History of stress test    a. 07/2016 MV: EF 55-65%. Small, mild defect  in mid anteroseptal,  apical septal, and apical locations. No ischemia-->low risk.   Hyperlipidemia    Hypertension    Insomnia    Lipoma    Migraine    Mild Mitral regurgitation    a. 07/2016 Echo: EF 55-65%, no rwma, mild MR. Mildly dil LA. Nl RV fx.    Osteoarthritis    left knee   Osteopenia    Peptic ulcer disease    a. H. pylori   Sleep apnea    does not use her cpap   Tendonitis    Thyroid disease    Vitamin D deficiency     Past Surgical History:  Procedure Laterality Date   ABDOMINAL HYSTERECTOMY     ovaries left   ACHILLES TENDON SURGERY     2012,01/2017   BACK SURGERY     CARDIOVERSION N/A 09/07/2016   Procedure: CARDIOVERSION;  Surgeon: Minna Merritts, MD;  Location: [facility];  Service: Cardiovascular;  Laterality: N/A;   CARDIOVERSION N/A 10/19/2016   Procedure: CARDIOVERSION;  Surgeon: Minna Merritts, MD;  Location: [facility];  Service: Cardiovascular;  Laterality: N/A;   CARDIOVERSION N/A 12/14/2016   Procedure: CARDIOVERSION;  Surgeon: Minna Merritts, MD;  Location: [facility];  Service: Cardiovascular;  Laterality: N/A;   COLONOSCOPY     COLONOSCOPY WITH PROPOFOL N/A 05/13/2020   Procedure: COLONOSCOPY WITH PROPOFOL;  Surgeon: Toledo, Benay Pike, MD;  Location: [facility];  Service: Gastroenterology;  Laterality: N/A;  ELIQUIS   ESOPHAGOGASTRODUODENOSCOPY     ESOPHAGOGASTRODUODENOSCOPY (EGD) WITH PROPOFOL N/A 05/13/2020   Procedure: ESOPHAGOGASTRODUODENOSCOPY (EGD) WITH PROPOFOL;  Surgeon: Toledo, Benay Pike, MD;  Location: [facility];  Service: Gastroenterology;  Laterality: N/A;   FOOT SURGERY     JOINT REPLACEMENT     THYROIDECTOMY     thyroid lobectomy secondary to a benign nodule   TOTAL HIP ARTHROPLASTY Right 08/21/2018   Procedure: TOTAL HIP ARTHROPLASTY ANTERIOR APPROACH-RIGHT CELL SAVER REQUESTED;  Surgeon: [name], MD;  Location: [facility];  Service: Orthopedics;  Laterality: Right;    Social History Social History   Tobacco Use   Smoking status: Former     Packs/day: 0.25    Years: 10.00    Pack years: 2.50    Types: Cigarettes    Quit date: 2005    Years since quitting: 17.4   Smokeless tobacco: Never   Tobacco comments:    smoked for 10 years, 1 pack every 2-3 days  Vaping Use   Vaping Use: Never used  Substance Use Topics   Alcohol use: Not Currently    Comment: social, occasional   Drug use: No  Family History Family History  Problem Relation Age of Onset   Cancer Sister 90       ovarian   Depression Sister    Cancer Brother        CLL   Cancer Father 23       colon   Heart disease Father    Heart attack Father 30       died from MI   Hyperlipidemia Father    Hypertension Father    Cancer Mother        leukemia   Breast cancer Paternal Aunt        37's   Migraines Neg Hx   No family history of bleeding/clotting disorders, porphyria or autoimmune disease   Allergies  Allergen Reactions   Prednisone Other (See Comments)    Agitation, hyperactivity, polyphagia   Amiodarone Nausea And Vomiting   Hydrocodone-Acetaminophen Other (See Comments)    Hallucinations and combative   Oxycodone Itching    Can take it with benadryl   Tapentadol Other (See Comments)   Tramadol Itching    Can take it with benadryl   Adhesive [Tape] Itching and Rash    Prefers paper tape   Latex Itching and Rash    use paper tap     REVIEW OF SYSTEMS (Negative unless checked)  Constitutional: [] Weight loss  [] Fever  [] Chills Cardiac: [] Chest pain   [] Chest pressure   [] Palpitations   [] Shortness of breath when laying flat   [] Shortness of breath with exertion. Vascular:  [] Pain in legs with walking   [] Pain in legs at rest  [] History of DVT   [] Phlebitis   [] Swelling in legs   [] Varicose veins   [] Non-healing ulcers Pulmonary:   [] Uses home oxygen   [] Productive cough   [] Hemoptysis   [] Wheeze  [] COPD   [] Asthma Neurologic:  [] Dizziness   [] Seizures   [] History of stroke   [] History of TIA  [] Aphasia   [] Vissual changes    [x] Weakness or numbness in arm   [x] Weakness or numbness in leg Musculoskeletal:   [] Joint swelling   [x] Joint pain   [x] Low back pain Hematologic:  [] Easy bruising  [] Easy bleeding   [] Hypercoagulable state   [] Anemic Gastrointestinal:  [] Diarrhea   [] Vomiting  [] Gastroesophageal reflux/heartburn   [] Difficulty swallowing. Genitourinary:  [] Chronic kidney disease   [] Difficult urination  [] Frequent urination   [] Blood in urine Skin:  [] Rashes   [] Ulcers  Psychological:  [] History of anxiety   []  History of major depression.  Physical Examination  Vitals:   12/31/20 1352  BP: 136/87  Pulse: 62  Resp: 16  Weight: 295 lb (133.8 kg)  Height: 5' 5.5" (1.664 m)   Body mass index is 48.34 kg/m. Gen: WD/WN, NAD Head: [place]/AT, No temporalis wasting.  Ear/Nose/Throat: Hearing grossly intact, nares w/o erythema or drainage, poor dentition Eyes: PER, EOMI, sclera nonicteric.  Neck: Supple, no masses.  No bruit or JVD.  Pulmonary:  Good air movement, clear to auscultation bilaterally, no use of accessory muscles.  Cardiac: RRR, normal S1, S2, no Murmurs. Vascular:  left arm is normal in appearance perhaps trace edema Vessel Right Left  Radial Palpable Palpable  Ulnar Palpable Palpable  Brachial Palpable Palpable  Gastrointestinal: soft, non-distended. No guarding/no peritoneal signs.  Musculoskeletal: M/S 5/5 throughout.  No deformity or atrophy.  Neurologic: CN 2-12 intact. Pain and light touch intact in extremities.  Symmetrical.  Speech is fluent. Motor exam as listed above. Psychiatric: Judgment intact, Mood & affect appropriate for pt's  clinical situation. Dermatologic: No rashes or ulcers noted.  No changes consistent with cellulitis.  CBC Lab Results  Component Value Date   WBC 6.2 03/31/2020   HGB 14.2 03/31/2020   HCT 42.2 03/31/2020   MCV 89.0 03/31/2020   PLT 188 03/31/2020    BMET    Component Value Date/Time   NA 141 09/22/2020 0950   NA 143 02/27/2018 1520   K 4.0  09/22/2020 0950   CL 100 09/22/2020 0950   CO2 30 09/22/2020 0950   GLUCOSE 98 09/22/2020 0950   BUN 16 09/22/2020 0950   BUN 17 02/27/2018 1520   CREATININE 0.89 09/22/2020 0950   CREATININE 0.65 05/14/2013 0851   CALCIUM 9.2 09/22/2020 0950   GFRNONAA >60 11/13/2019 2229   GFRAA >60 11/13/2019 2229   CrCl cannot be calculated (Patient's most recent lab result is older than the maximum 21 days allowed.).  COAG Lab Results  Component Value Date   INR 1.10 08/08/2018   INR 1.05 04/30/2017   INR 1.1 10/11/2016    Radiology MM 3D SCREEN BREAST BILATERAL  Result Date: 12/30/2020 CLINICAL DATA:  Screening. EXAM: DIGITAL SCREENING BILATERAL MAMMOGRAM WITH TOMOSYNTHESIS AND CAD TECHNIQUE: Bilateral screening digital craniocaudal and mediolateral oblique mammograms were obtained. Bilateral screening digital breast tomosynthesis was performed. The images were evaluated with computer-aided detection. COMPARISON:  Previous exam(s). ACR Breast Density Category b: There are scattered areas of fibroglandular density. FINDINGS: In the right breast, possible masses and asymmetry warrant further evaluation. In the left breast, no findings suspicious for malignancy. IMPRESSION: Further evaluation is suggested for a possible masses and asymmetry in the right breast. RECOMMENDATION: Diagnostic mammogram and possibly ultrasound of the right breast. (Code:[id number]) The patient will be contacted regarding the findings, and additional imaging will be scheduled. BI-RADS CATEGORY  0: Incomplete. Need additional imaging evaluation and/or prior mammograms for comparison. Electronically Signed   By: Kristopher Oppenheim M.D.   On: 12/30/2020 16:11  [place] CHEST SOFT TISSUE  Result Date: 12/08/2020 CLINICAL DATA:  70 year old female with palpable abnormality of the left posterior thorax x6 years. EXAM: ULTRASOUND OF CHEST SOFT TISSUES TECHNIQUE: Ultrasound examination was performed in the area of clinical concern. COMPARISON:   CTA chest 08/23/2018 FINDINGS: Grayscale and brief color Doppler images of the "left back" palpable area of concern. Fairly circumscribed oval 4.8 x 1.9 x 3.0 cm area of subcutaneous fat is demonstrated (image 4). No hypervascularity. Confirmed asymmetric appearance at the site of the lesion is demonstrated on image 11. Underlying muscle air and other regional fibromuscular architecture remains normal. IMPRESSION: 4.8 cm subcutaneous Lipoma corresponding to the palpable abnormality. Electronically Signed   By: Genevie Ann M.D.   On: 12/08/2020 07:33   VAS [place] UPPER EXTREMITY VENOUS DUPLEX  Result Date: 12/11/2020 UPPER VENOUS STUDY  Patient Name:  CICLALY MULCAHEY  Date of Exam:   12/08/2020 Medical Rec #: [id number]         Accession #:    [id number] Date of Birth: May 03, 1951         Patient Gender: F Patient Age:   81Y Exam Location:  [facility] Procedure:      VAS [place] UPPER EXTREMITY VENOUS DUPLEX Referring Phys: [id number] [place] G AR[place]T --------------------------------------------------------------------------------  Indications: Swelling, and intermittant Performing Technologist: Concha [place] RVT  Examination Guidelines: A complete evaluation includes B-mode imaging, spectral Doppler, color Doppler, and power Doppler as needed of all accessible portions of each vessel. Bilateral testing is considered an integral part of a complete  examination. Limited examinations for reoccurring indications may be performed as noted.  Left Findings: +----------+------------+---------+-----------+----------+-------+ LEFT      CompressiblePhasicitySpontaneousPropertiesSummary +----------+------------+---------+-----------+----------+-------+ IJV           Full       Yes       Yes                      +----------+------------+---------+-----------+----------+-------+ Subclavian    Full       Yes       Yes                      +----------+------------+---------+-----------+----------+-------+  Axillary      Full       Yes       Yes                      +----------+------------+---------+-----------+----------+-------+ Brachial      Full       Yes       Yes                      +----------+------------+---------+-----------+----------+-------+ Radial        Full       Yes       Yes                      +----------+------------+---------+-----------+----------+-------+ Ulnar         Full       Yes       Yes                      +----------+------------+---------+-----------+----------+-------+ Cephalic      Full       Yes       Yes                      +----------+------------+---------+-----------+----------+-------+ Basilic       Full       Yes       Yes                      +----------+------------+---------+-----------+----------+-------+  Summary:  Left: No evidence of deep vein thrombosis in the upper extremity. No evidence of superficial vein thrombosis in the upper extremity. Added arterial duplex shows triphasic flow at the brachial. ulnar, and radial arteries.  *See table(s) above for measurements and observations.  Diagnosing physician: Leotis Pain MD Electronically signed by Leotis Pain MD on 12/11/2020 at 10:56:24 AM.    Final      Assessment/Plan 1. Left arm pain Recommend:  The patient has atypical pain symptoms for vascular disease  I do not find evidence of Vascular pathology that would explain the patient's symptoms and I suspect the patient is c/o pain related to cervical DJD.  Patient should have an evaluation of his cervical spine which I defer to the primary service.  The patient should continue walking and begin a more formal exercise program. The patient should continue his antiplatelet therapy and aggressive treatment of the lipid abnormalities.  Patient will follow-up with me on a PRN basis  Further work-up of her upper extremity pain is deferred to the primary service      2. Primary osteoarthritis involving multiple  joints Continue NSAID medications as already ordered, these medications have been reviewed and there are no changes at this time.  Continued activity and therapy was stressed.   3.  Paroxysmal atrial fibrillation (HCC) Continue antiarrhythmia medications as already ordered, these medications have been reviewed and there are no changes at this time.  Continue anticoagulation as ordered by Cardiology Service   4. Hypertension, unspecified type Continue antihypertensive medications as already ordered, these medications have been reviewed and there are no changes at this time.     Hortencia Pilar, MD  01/04/2021 12:21 PM

## 2021-01-05 ENCOUNTER — Other Ambulatory Visit: Payer: Self-pay | Admitting: Family

## 2021-01-06 ENCOUNTER — Telehealth: Payer: Self-pay

## 2021-01-06 ENCOUNTER — Encounter: Payer: Self-pay | Admitting: Family

## 2021-01-06 ENCOUNTER — Ambulatory Visit (INDEPENDENT_AMBULATORY_CARE_PROVIDER_SITE_OTHER): Payer: Medicare Other | Admitting: Family

## 2021-01-06 ENCOUNTER — Other Ambulatory Visit: Payer: Self-pay

## 2021-01-06 DIAGNOSIS — F32A Depression, unspecified: Secondary | ICD-10-CM

## 2021-01-06 DIAGNOSIS — M79602 Pain in left arm: Secondary | ICD-10-CM | POA: Diagnosis not present

## 2021-01-06 DIAGNOSIS — F419 Anxiety disorder, unspecified: Secondary | ICD-10-CM

## 2021-01-06 MED ORDER — DULOXETINE HCL 30 MG PO CPEP: ORAL_CAPSULE | ORAL | 3 refills | Status: DC

## 2021-01-06 NOTE — Progress Notes (Signed)
Subjective:    Patient ID: Amber Barnes, female    DOB: Nov 09, 1950, 70 y.o.   MRN: [id number]  CC: Amber Barnes is a 70 y.o. female who presents today for follow up.   HPI: Accompanied by step daughter today She is worried about her husband as his health is deteriorating.She is depressed due to left arm pain and low back pain. She feels 'pain from head to toe. She is more irritable.  Increased anxiety. She is compliant with xanax 0.25mg , buspar 5mg  tid, zoloft 150mg . She has tried gabapentin in the past  Left arm pain and  left posterior neck pain continue. Numbness traveling lateral arm and into all 5 fingers. No injury.   She would like referral to orthopedics, Dr Mayer Camel .   She also has left knee pain. Left arm swelling has largely resolved.   Complains of low back pain center, numbness and pain travels posteriorly to bilateral knees.  No falls, weakness.   She has had low back surgery 6 or more years ago  per patient and epidural injections without relief.  No abdominal pain, constipation, dysuria, saddle anesthesia.   She has seen Dr Ronalee Belts whom felt left arm pain related to neck pain versus vascular etiology.    She doesn't take NSAIDs as on eliquis.   12/11/20 Left upper venous duplex- no evidence of DVT  QTc 466 12/2020  HISTORY:  Past Medical History:  Diagnosis Date   Achilles tendinitis    Acute medial meniscal tear    Allergy    Anxiety    Arthritis    Atrial fibrillation ([place])    a. new onset 07/2016; b. CHADS2VASc --> 2 (HTN, female); c. eliquis   CHF (congestive heart failure) ([facility])    Chronic lower back pain    Colon polyp    COPD (chronic obstructive pulmonary disease) ([place])    Cystic breast    Depression    Dysrhythmia    ATRIAL FIB   Endometriosis    Fibrocystic breast disease    GERD (gastroesophageal reflux disease)    History of stress test    a. 07/2016 MV: EF 55-65%. Small, mild defect  in mid anteroseptal, apical septal, and apical  locations. No ischemia-->low risk.   Hyperlipidemia    Hypertension    Insomnia    Lipoma    Migraine    Mild Mitral regurgitation    a. 07/2016 Echo: EF 55-65%, no rwma, mild MR. Mildly dil LA. Nl RV fx.    Osteoarthritis    left knee   Osteopenia    Peptic ulcer disease    a. H. pylori   Sleep apnea    does not use her cpap   Tendonitis    Thyroid disease    Vitamin D deficiency    Past Surgical History:  Procedure Laterality Date   ABDOMINAL HYSTERECTOMY     ovaries left   ACHILLES TENDON SURGERY     2012,01/2017   BACK SURGERY     CARDIOVERSION N/A 09/07/2016   Procedure: CARDIOVERSION;  Surgeon: Minna Merritts, MD;  Location: [facility];  Service: Cardiovascular;  Laterality: N/A;   CARDIOVERSION N/A 10/19/2016   Procedure: CARDIOVERSION;  Surgeon: Minna Merritts, MD;  Location: [facility];  Service: Cardiovascular;  Laterality: N/A;   CARDIOVERSION N/A 12/14/2016   Procedure: CARDIOVERSION;  Surgeon: Minna Merritts, MD;  Location: [facility];  Service: Cardiovascular;  Laterality: N/A;   COLONOSCOPY     COLONOSCOPY WITH PROPOFOL N/A 05/13/2020  Procedure: COLONOSCOPY WITH PROPOFOL;  Surgeon: Toledo, Benay Pike, MD;  Location: [facility];  Service: Gastroenterology;  Laterality: N/A;  ELIQUIS   ESOPHAGOGASTRODUODENOSCOPY     ESOPHAGOGASTRODUODENOSCOPY (EGD) WITH PROPOFOL N/A 05/13/2020   Procedure: ESOPHAGOGASTRODUODENOSCOPY (EGD) WITH PROPOFOL;  Surgeon: Toledo, Benay Pike, MD;  Location: [facility];  Service: Gastroenterology;  Laterality: N/A;   FOOT SURGERY     JOINT REPLACEMENT     THYROIDECTOMY     thyroid lobectomy secondary to a benign nodule   TOTAL HIP ARTHROPLASTY Right 08/21/2018   Procedure: TOTAL HIP ARTHROPLASTY ANTERIOR APPROACH-RIGHT CELL SAVER REQUESTED;  Surgeon: [name], MD;  Location: [facility];  Service: Orthopedics;  Laterality: Right;   Family History  Problem Relation Age of Onset   Cancer Sister 45       ovarian   Depression Sister     Cancer Brother        CLL   Cancer Father 55       colon   Heart disease Father    Heart attack Father 44       died from MI   Hyperlipidemia Father    Hypertension Father    Cancer Mother        leukemia   Breast cancer Paternal Aunt        74's   Migraines Neg Hx     Allergies: Prednisone, Amiodarone, Hydrocodone-acetaminophen, Oxycodone, Tapentadol, Tramadol, Adhesive [tape], and Latex Current Outpatient Medications on File Prior to Visit  Medication Sig Dispense Refill   acetaminophen (TYLENOL) 500 MG tablet Take 500 mg by mouth every 6 (six) hours as needed. Taking as needed     albuterol (VENTOLIN HFA) 108 (90 Base) MCG/ACT inhaler INHALE 2 PUFFS INTO THE LUNGS EVERY 8 (EIGHT) HOURS AS NEEDED FOR WHEEZING OR SHORTNESS OF BREATH. 6.7 g 0   ALPRAZolam (XANAX) 0.25 MG tablet TAKE 1 TABLET BY MOUTH DAILY AS NEEDED FOR ANXIETY OR SLEEP. 30 tablet 1   azelastine (ASTELIN) 0.1 % nasal spray Place into both nostrils as needed for rhinitis. Use in each nostril as directed     B Complex Vitamins (VITAMIN B COMPLEX PO) Take 1 tablet by mouth daily.      bisoprolol (ZEBETA) 5 MG tablet TAKE 1 TABLET BY MOUTH IN THE MORNING AND MAY TAKE AN EXTRA IN THE EVENING IF NEEDED FOR HEART RATE (Patient taking differently: Take 5 mg by mouth 2 (two) times daily.) 180 tablet 0   busPIRone (BUSPAR) 5 MG tablet TAKE 1 TABLET BY MOUTH THREE TIMES A DAY AS NEEDED 60 tablet 1   calcium gluconate 500 MG tablet Take 500 tablets by mouth daily.     calcium-vitamin D (OSCAL WITH D) 500-200 MG-UNIT tablet Take 1 tablet by mouth daily with breakfast.     Cholecalciferol (VITAMIN D3) 5000 units CAPS Take 5,000 Units by mouth daily.     ELIQUIS 5 MG TABS tablet TAKE 1 TABLET BY MOUTH TWICE A DAY 180 tablet 1   esomeprazole (NEXIUM) 20 MG capsule Take 20 mg by mouth as needed.     furosemide (LASIX) 20 MG tablet TAKE 1 TABLET BY MOUTH TWICE A DAY 180 tablet 0   hydrochlorothiazide (MICROZIDE) 12.5 MG capsule TAKE  1 CAPSULE BY MOUTH EVERY DAY 90 capsule 2   losartan (COZAAR) 100 MG tablet Take 1 tablet (100 mg total) by mouth daily. PLEASE CALL OFFICE TO SCHEDULE AN APPOINTMENT FOR FURTHER REFILLS 90 tablet 0   Multiple Vitamin (MULTIVITAMIN) tablet Take 1 tablet  by mouth daily.     potassium chloride (KLOR-CON) 10 MEQ tablet TAKE 1 TABLET TWICE DAILY. PLEASE MAKE APPT FOR FUTURE REFILLS. 180 tablet 0   ranolazine (RANEXA) 500 MG 12 hr tablet TAKE 1 TABLET BY MOUTH TWICE A DAY 180 tablet 3   rizatriptan (MAXALT) 10 MG tablet TAKE 1 TABLET (10 MG TOTAL) BY MOUTH AS NEEDED FOR MIGRAINE. MAY REPEAT IN 2 HOURS IF NEEDED 10 tablet 0   rOPINIRole (REQUIP) 1 MG tablet Take 1 tablet (1 mg total) by mouth at bedtime. 90 tablet 1   rosuvastatin (CRESTOR) 20 MG tablet TAKE 1 TABLET BY MOUTH EVERY DAY 90 tablet 0   No current facility-administered medications on file prior to visit.    Social History   Tobacco Use   Smoking status: Former    Packs/day: 0.25    Years: 10.00    Pack years: 2.50    Types: Cigarettes    Quit date: 2005    Years since quitting: 17.5   Smokeless tobacco: Never   Tobacco comments:    smoked for 10 years, 1 pack every 2-3 days  Vaping Use   Vaping Use: Never used  Substance Use Topics   Alcohol use: Not Currently    Comment: social, occasional   Drug use: No    Review of Systems  Constitutional:  Negative for chills and fever.  Respiratory:  Negative for cough.   Cardiovascular:  Negative for chest pain and palpitations.  Gastrointestinal:  Negative for abdominal pain, nausea and vomiting.  Musculoskeletal:  Positive for arthralgias and back pain. Negative for joint swelling.  Neurological:  Positive for numbness. Negative for weakness.  Psychiatric/Behavioral:  The patient is nervous/anxious.      Objective:    BP 118/72 (BP Location: Right Arm, Patient Position: Sitting, Cuff Size: Large)   Pulse 66   Temp 97.8 F (36.6 C) (Oral)   Ht 5' 5.5" (1.664 m)   Wt 295  lb (133.8 kg)   SpO2 96%   BMI 48.34 kg/m  BP Readings from Last 3 Encounters:  01/06/21 118/72  12/31/20 136/87  12/16/20 108/70   Wt Readings from Last 3 Encounters:  01/06/21 295 lb (133.8 kg)  12/31/20 295 lb (133.8 kg)  12/16/20 (!) 301 lb (136.5 kg)    Physical Exam Vitals reviewed.  Constitutional:      Appearance: She is well-developed.  Eyes:     Conjunctiva/sclera: Conjunctivae normal.  Cardiovascular:     Rate and Rhythm: Normal rate and regular rhythm.     Pulses: Normal pulses.     Heart sounds: Normal heart sounds.  Pulmonary:     Effort: Pulmonary effort is normal.     Breath sounds: Normal breath sounds. No wheezing, rhonchi or rales.  Musculoskeletal:     Right shoulder: Normal. No swelling, tenderness or bony tenderness. Normal range of motion.     Left shoulder: Normal. No swelling, tenderness or bony tenderness. Normal range of motion.     Right upper arm: No swelling.     Left upper arm: No swelling, tenderness or bony tenderness.     Cervical back: Tenderness present. No torticollis or bony tenderness. No pain with movement.     Lumbar back: No swelling, edema, spasms, tenderness or bony tenderness. Normal range of motion.       Back:     Comments: Full range of motion with flexion, tension, lateral side bends. No bony tenderness. No pain, numbness, tingling elicited with single leg  raise bilaterally.   Skin:    General: Skin is warm and dry.  Neurological:     Mental Status: She is alert.     Sensory: No sensory deficit.     Deep Tendon Reflexes:     Reflex Scores:      Patellar reflexes are 2+ on the right side and 2+ on the left side.    Comments: Sensation and strength intact bilateral lower extremities.  Psychiatric:        Speech: Speech normal.        Behavior: Behavior normal.        Thought Content: Thought content normal.       Assessment & Plan:   Problem List Items Addressed This Visit       Other   Anxiety and depression     Uncontrolled. Stop zoloft. Start cymbalta. Continue xanax, buspar. May increase buspar at follow up.        Relevant Medications   DULoxetine (CYMBALTA) 30 MG capsule   Left arm pain    Uncontrolled arm, back pain. Reviewed consult with vascular. Referral to orthopedic. Advised Xrays of entire back today and she declines and would prefer to have them done with orthopedic. Start cymbalta. Close follow up.        Relevant Orders   Ambulatory referral to Orthopedic Surgery     I have discontinued [name] "Jackie"'s sertraline. I am also having her start on DULoxetine. Additionally, I am having her maintain her multivitamin, B Complex Vitamins (VITAMIN B COMPLEX PO), Vitamin D3, calcium gluconate, rizatriptan, albuterol, acetaminophen, calcium-vitamin D, ranolazine, rOPINIRole, potassium chloride, hydrochlorothiazide, ALPRAZolam, busPIRone, losartan, Eliquis, rosuvastatin, bisoprolol, furosemide, esomeprazole, and azelastine.   Meds ordered this encounter  Medications   DULoxetine (CYMBALTA) 30 MG capsule    Sig: Take one 30 mg tablet by mouth once a day for the first week. Then increase to two 30 mg tablets ( total 60mg ) by mouth once daily.    Dispense:  60 capsule    Refill:  3    Order Specific Question:   Supervising Provider    Answer:   Crecencio Mc [[id number]]    Return precautions given.   Risks, benefits, and alternatives of the medications and treatment plan prescribed today were discussed, and patient expressed understanding.   Education regarding symptom management and diagnosis given to patient on AVS.  Continue to follow with [name], FNP for routine health maintenance.   [name] and I agreed with plan.   [name], FNP

## 2021-01-06 NOTE — Patient Instructions (Signed)
Decrease Zoloft today to 100mg  taken once per day.  After 3-4 days, you may then decrease to 50mg  once daily. Stop zoloft 50mg  after you have taken for 7 days.  Once you are on zoloft 50mg , you may then start cymbalta 30mg  once per day and follow titration instructions.   Please let me know which orthopedic you would like to be referred too.

## 2021-01-06 NOTE — Telephone Encounter (Signed)
Patient stated that she wanted to be referred to this orthopedist:  DR Kerin Salen Doctor of Medicine Orthopedist Website Directions Contact us Address: [address] [place], [place] [address]   Phone: [phone] Hours: Open  Closes 5:30 PM

## 2021-01-06 NOTE — Telephone Encounter (Signed)
I called [facility] & left message for patient.asking to reach out to patient ti schedule additional imaging. I called patient to advised & she stated that she literally was scheduled with them a few seconds before she received my call. Pt scheduled for Friday.

## 2021-01-06 NOTE — Assessment & Plan Note (Addendum)
Uncontrolled arm, back pain. Reviewed consult with vascular. Referral to orthopedic. Advised Xrays of entire back today and she declines and would prefer to have them done with orthopedic. Start cymbalta. Close follow up.

## 2021-01-06 NOTE — Assessment & Plan Note (Signed)
Uncontrolled. Stop zoloft. Start cymbalta. Continue xanax, buspar. May increase buspar at follow up.

## 2021-01-08 ENCOUNTER — Ambulatory Visit
Admission: RE | Admit: 2021-01-08 | Discharge: 2021-01-08 | Disposition: A | Discharge status: Home / Self Care | Payer: Medicare Other | Source: Ambulatory Visit | Attending: Family | Admitting: Family

## 2021-01-08 ENCOUNTER — Other Ambulatory Visit: Payer: Self-pay

## 2021-01-08 DIAGNOSIS — N6310 Unspecified lump in the right breast, unspecified quadrant: Secondary | ICD-10-CM | POA: Diagnosis not present

## 2021-01-08 DIAGNOSIS — R928 Other abnormal and inconclusive findings on diagnostic imaging of breast: Secondary | ICD-10-CM

## 2021-01-08 DIAGNOSIS — N6489 Other specified disorders of breast: Secondary | ICD-10-CM | POA: Diagnosis not present

## 2021-01-08 DIAGNOSIS — R922 Inconclusive mammogram: Secondary | ICD-10-CM | POA: Diagnosis not present

## 2021-01-09 ENCOUNTER — Other Ambulatory Visit: Payer: Self-pay | Admitting: Family

## 2021-01-09 DIAGNOSIS — F419 Anxiety disorder, unspecified: Secondary | ICD-10-CM

## 2021-01-12 ENCOUNTER — Encounter: Payer: Self-pay | Admitting: Family

## 2021-01-13 NOTE — Telephone Encounter (Signed)
I spoke with [facility] & was informed that Sam who does scheduling out of office today. She should be back tomorrow & she can advise on the earliest scheduling.   Called & advised patient of this & I will call back Sam tomorrow.

## 2021-01-14 ENCOUNTER — Other Ambulatory Visit: Payer: Self-pay | Admitting: Family

## 2021-01-14 DIAGNOSIS — R928 Other abnormal and inconclusive findings on diagnostic imaging of breast: Secondary | ICD-10-CM

## 2021-01-14 DIAGNOSIS — N6310 Unspecified lump in the right breast, unspecified quadrant: Secondary | ICD-10-CM

## 2021-01-14 DIAGNOSIS — M1712 Unilateral primary osteoarthritis, left knee: Secondary | ICD-10-CM | POA: Diagnosis not present

## 2021-01-15 ENCOUNTER — Ambulatory Visit
Admission: RE | Admit: 2021-01-15 | Discharge: 2021-01-15 | Disposition: A | Discharge status: Home / Self Care | Payer: Medicare Other | Source: Ambulatory Visit | Attending: Family | Admitting: Family

## 2021-01-15 ENCOUNTER — Other Ambulatory Visit: Payer: Self-pay

## 2021-01-15 DIAGNOSIS — Z171 Estrogen receptor negative status [ER-]: Secondary | ICD-10-CM | POA: Diagnosis not present

## 2021-01-15 DIAGNOSIS — R928 Other abnormal and inconclusive findings on diagnostic imaging of breast: Secondary | ICD-10-CM

## 2021-01-15 DIAGNOSIS — N6310 Unspecified lump in the right breast, unspecified quadrant: Secondary | ICD-10-CM

## 2021-01-15 DIAGNOSIS — Z7689 Persons encountering health services in other specified circumstances: Secondary | ICD-10-CM | POA: Diagnosis not present

## 2021-01-15 DIAGNOSIS — C50811 Malignant neoplasm of overlapping sites of right female breast: Secondary | ICD-10-CM | POA: Diagnosis not present

## 2021-01-15 DIAGNOSIS — C50411 Malignant neoplasm of upper-outer quadrant of right female breast: Secondary | ICD-10-CM | POA: Diagnosis not present

## 2021-01-15 HISTORY — PX: BREAST BIOPSY: SHX20

## 2021-01-18 ENCOUNTER — Telehealth: Payer: Self-pay | Admitting: Family

## 2021-01-18 ENCOUNTER — Encounter: Payer: Self-pay | Admitting: *Deleted

## 2021-01-18 NOTE — Progress Notes (Signed)
Notified by Electa Sniff, RN that patient had been informed of her biopsy results that are positive for invasive mammary carcinoma and is ready for navigation.  Called patient to introduce navigation services.  Patient has seen Dr. Tasia Catchings in the past.  I have scheduled her to see Dr.Yu tomorrow at 1:30 and Dr. [name] on Thursday for surgical consult.  I did explain that molecular studies are still pending.  Will give educational material to patient on Tuesday at her appointment.

## 2021-01-19 ENCOUNTER — Encounter: Payer: Self-pay | Admitting: *Deleted

## 2021-01-19 ENCOUNTER — Encounter: Payer: Self-pay | Admitting: Oncology

## 2021-01-19 ENCOUNTER — Other Ambulatory Visit: Payer: Self-pay

## 2021-01-19 ENCOUNTER — Inpatient Hospital Stay: Payer: Medicare Other | Attending: Oncology | Admitting: Oncology

## 2021-01-19 ENCOUNTER — Telehealth: Payer: Self-pay | Admitting: Family

## 2021-01-19 ENCOUNTER — Inpatient Hospital Stay: Payer: Medicare Other

## 2021-01-19 VITALS — BP 146/64 | HR 55 | Temp 98.1°F | Resp 18 | Wt 295.6 lb

## 2021-01-19 DIAGNOSIS — I10 Essential (primary) hypertension: Secondary | ICD-10-CM | POA: Diagnosis not present

## 2021-01-19 DIAGNOSIS — R634 Abnormal weight loss: Secondary | ICD-10-CM | POA: Insufficient documentation

## 2021-01-19 DIAGNOSIS — Z803 Family history of malignant neoplasm of breast: Secondary | ICD-10-CM | POA: Insufficient documentation

## 2021-01-19 DIAGNOSIS — Z8249 Family history of ischemic heart disease and other diseases of the circulatory system: Secondary | ICD-10-CM | POA: Diagnosis not present

## 2021-01-19 DIAGNOSIS — K760 Fatty (change of) liver, not elsewhere classified: Secondary | ICD-10-CM | POA: Insufficient documentation

## 2021-01-19 DIAGNOSIS — Z171 Estrogen receptor negative status [ER-]: Secondary | ICD-10-CM | POA: Diagnosis not present

## 2021-01-19 DIAGNOSIS — Z888 Allergy status to other drugs, medicaments and biological substances status: Secondary | ICD-10-CM | POA: Insufficient documentation

## 2021-01-19 DIAGNOSIS — R110 Nausea: Secondary | ICD-10-CM | POA: Diagnosis not present

## 2021-01-19 DIAGNOSIS — Z818 Family history of other mental and behavioral disorders: Secondary | ICD-10-CM | POA: Insufficient documentation

## 2021-01-19 DIAGNOSIS — Z806 Family history of leukemia: Secondary | ICD-10-CM | POA: Insufficient documentation

## 2021-01-19 DIAGNOSIS — K219 Gastro-esophageal reflux disease without esophagitis: Secondary | ICD-10-CM | POA: Insufficient documentation

## 2021-01-19 DIAGNOSIS — Z885 Allergy status to narcotic agent status: Secondary | ICD-10-CM | POA: Diagnosis not present

## 2021-01-19 DIAGNOSIS — Z923 Personal history of irradiation: Secondary | ICD-10-CM | POA: Insufficient documentation

## 2021-01-19 DIAGNOSIS — Z87891 Personal history of nicotine dependence: Secondary | ICD-10-CM | POA: Diagnosis not present

## 2021-01-19 DIAGNOSIS — Z79899 Other long term (current) drug therapy: Secondary | ICD-10-CM | POA: Diagnosis not present

## 2021-01-19 DIAGNOSIS — J449 Chronic obstructive pulmonary disease, unspecified: Secondary | ICD-10-CM | POA: Insufficient documentation

## 2021-01-19 DIAGNOSIS — Z809 Family history of malignant neoplasm, unspecified: Secondary | ICD-10-CM

## 2021-01-19 DIAGNOSIS — Z800 Family history of malignant neoplasm of digestive organs: Secondary | ICD-10-CM | POA: Diagnosis not present

## 2021-01-19 DIAGNOSIS — R5383 Other fatigue: Secondary | ICD-10-CM | POA: Diagnosis not present

## 2021-01-19 DIAGNOSIS — Z7901 Long term (current) use of anticoagulants: Secondary | ICD-10-CM | POA: Insufficient documentation

## 2021-01-19 DIAGNOSIS — Z8349 Family history of other endocrine, nutritional and metabolic diseases: Secondary | ICD-10-CM | POA: Diagnosis not present

## 2021-01-19 DIAGNOSIS — N6489 Other specified disorders of breast: Secondary | ICD-10-CM | POA: Insufficient documentation

## 2021-01-19 DIAGNOSIS — R195 Other fecal abnormalities: Secondary | ICD-10-CM | POA: Diagnosis not present

## 2021-01-19 DIAGNOSIS — Z8041 Family history of malignant neoplasm of ovary: Secondary | ICD-10-CM | POA: Diagnosis not present

## 2021-01-19 DIAGNOSIS — R61 Generalized hyperhidrosis: Secondary | ICD-10-CM

## 2021-01-19 DIAGNOSIS — C50919 Malignant neoplasm of unspecified site of unspecified female breast: Secondary | ICD-10-CM

## 2021-01-19 DIAGNOSIS — Z7189 Other specified counseling: Secondary | ICD-10-CM

## 2021-01-19 DIAGNOSIS — C50411 Malignant neoplasm of upper-outer quadrant of right female breast: Secondary | ICD-10-CM | POA: Diagnosis not present

## 2021-01-19 DIAGNOSIS — R112 Nausea with vomiting, unspecified: Secondary | ICD-10-CM

## 2021-01-19 LAB — CBC WITH DIFFERENTIAL/PLATELET
Abs Immature Granulocytes: 0.01 10*3/uL (ref 0.00–0.07)
Basophils Absolute: 0 10*3/uL (ref 0.0–0.1)
Basophils Relative: 0 %
Eosinophils Absolute: 0.1 10*3/uL (ref 0.0–0.5)
Eosinophils Relative: 1 %
HCT: 43 % (ref 36.0–46.0)
Hemoglobin: 14.3 g/dL (ref 12.0–15.0)
Immature Granulocytes: 0 %
Lymphocytes Relative: 28 %
Lymphs Abs: 1.8 10*3/uL (ref 0.7–4.0)
MCH: 30.2 pg (ref 26.0–34.0)
MCHC: 33.3 g/dL (ref 30.0–36.0)
MCV: 90.9 fL (ref 80.0–100.0)
Monocytes Absolute: 0.5 10*3/uL (ref 0.1–1.0)
Monocytes Relative: 7 %
Neutro Abs: 4 10*3/uL (ref 1.7–7.7)
Neutrophils Relative %: 64 %
Platelets: 183 10*3/uL (ref 150–400)
RBC: 4.73 MIL/uL (ref 3.87–5.11)
RDW: 13.1 % (ref 11.5–15.5)
WBC: 6.3 10*3/uL (ref 4.0–10.5)
nRBC: 0 % (ref 0.0–0.2)

## 2021-01-19 LAB — COMPREHENSIVE METABOLIC PANEL
ALT: 17 U/L (ref 0–44)
AST: 21 U/L (ref 15–41)
Albumin: 4.1 g/dL (ref 3.5–5.0)
Alkaline Phosphatase: 75 U/L (ref 38–126)
Anion gap: 11 (ref 5–15)
BUN: 14 mg/dL (ref 8–23)
CO2: 29 mmol/L (ref 22–32)
Calcium: 8.8 mg/dL — ABNORMAL LOW (ref 8.9–10.3)
Chloride: 98 mmol/L (ref 98–111)
Creatinine, Ser: 0.73 mg/dL (ref 0.44–1.00)
GFR, Estimated: >60 mL/min (ref >60)
Glucose, Bld: 91 mg/dL (ref 70–99)
Potassium: 3.5 mmol/L (ref 3.5–5.1)
Sodium: 138 mmol/L (ref 135–145)
Total Bilirubin: 0.8 mg/dL (ref 0.3–1.2)
Total Protein: 8.1 g/dL (ref 6.5–8.1)

## 2021-01-19 NOTE — Progress Notes (Signed)
Hematology/Oncology Consult note [facility] Telephone:([phone] [phone]   Patient Care Team: [name], FNP as PCP - General (Family Medicine) [name] [name], MD as PCP - Cardiology (Cardiology) Charletta Cousin, MD (Inactive) [name], [name], MD as Consulting Physician (Cardiology) Rico Junker, RN as Oncology Nurse Navigator  REFERRING PROVIDER: [name], FNP  CHIEF COMPLAINTS/REASON FOR VISIT:  Evaluation of breast cancer  HISTORY OF PRESENTING ILLNESS:   Amber Barnes is a  70 y.o.  female with PMH listed below was seen in consultation at the request of  [name], FNP  for evaluation of weight loss, family history ovarian cancer. Patient reports a history of nausea vomiting, early satiety, dysphagia symptoms that started in March.  She has lost significant amount of weight as well. Patient weighed 340 pounds in February 2021, 309 pounds in May 2021 and currently she weighs 296 pounds today. Patient has tried Zofran and scopolamine patch which did not relieve her symptoms. 11/05/2019, CT abdomen pelvis showed chronic changes with out acute abnormality to correspond with the patient's symptoms. Patient has had right upper quadrant ultrasound done on 11/14/2019, which showed hepatic steatosis with no other acute abnormality. Patient was seen by gastroenterology Dr. [name] on 11/18/2019.  Patient was recommended to take PPI daily.  Added Carafate.  H. pylori testing came back positive and the patient was started on a course of tetracycline, metronidazole, Pepto-Bismol for H. pylori treatments.  Patient reports that she has been on the treatment for 7 days now.  Nausea is slightly better but still persistent.  She is following up with gastroenterology next week for discussion of EGD. Currently she is not able to tolerate colonoscopy prep due to the nausea.  Her stool was tested positive for occult blood. She also  reports night sweating which is a chronic symptom for her.   INTERVAL HISTORY Amber Barnes is a 70 y.o. female who has above history reviewed by me today presents to reestablish care for newly diagnosed right breast cancer.    Patient was last seen on 05/20/2020 for evaluation of weight loss, family history of ovarian cancer patient was referred to establish care with genetic counselor.  Patient eventually declined genetic testing.  12/30/2020, patient had a screening mammogram done which showed possible mass and asymmetry in the right breast. 01/08/2021, diagnostic mammogram of the right breast showed 2 adjacent irregular spiculated masses.  Larger 1 was 10 mm and a smaller mass measured 5 mm. 01/15/2021 stereotactic biopsy of the right breast smaller mass showed invasive carcinoma no special type, grade 2, DCIS present, LVI negative. Ultrasound guided biopsy of the larger mass showed invasive mammary carcinoma,   Patient has appointment with surgery Dr. [name] later this week.  She was referred to establish care with oncology for evaluation.  She was accompanied by daughter. She has no new complaints.  Family history of breast cancer: Denies Family history of other cancers: Patient has a strong family history of leukemia in her mother, colon cancer in her father at age of 14, ovarian cancer in her sister at age of 66, CLL in her brother  Menarche:  Menopause:  Number of pregnancies :  Age at first live childbirth: Used OCP:  Used estrogen and progesterone therapy:  History of Radiation to the chest:  Previous of breast biopsy:    Review of Systems  Constitutional:  Positive for fatigue. Negative for appetite change, chills, fever and unexpected weight change.  HENT:  Negative for hearing loss and voice change.   Eyes:  Negative for eye problems.  Respiratory:  Negative for chest tightness and cough.   Cardiovascular:  Negative for chest pain.  Gastrointestinal:  Negative for  abdominal distention, abdominal pain and blood in stool.  Endocrine: Negative for hot flashes.  Genitourinary:  Negative for difficulty urinating and frequency.   Musculoskeletal:  Negative for arthralgias.  Skin:  Negative for itching and rash.  Neurological:  Negative for extremity weakness.  Hematological:  Negative for adenopathy.  Psychiatric/Behavioral:  Negative for confusion.    MEDICAL HISTORY:  Past Medical History:  Diagnosis Date   Achilles tendinitis    Acute medial meniscal tear    Allergy    Anxiety    Arthritis    Atrial fibrillation ([place])    a. new onset 07/2016; b. CHADS2VASc --> 2 (HTN, female); c. eliquis   CHF (congestive heart failure) (HCC)    Chronic lower back pain    Colon polyp    COPD (chronic obstructive pulmonary disease) (HCC)    Cystic breast    Depression    Dysrhythmia    ATRIAL FIB   Endometriosis    Fibrocystic breast disease    GERD (gastroesophageal reflux disease)    History of stress test    a. 07/2016 MV: EF 55-65%. Small, mild defect  in mid anteroseptal, apical septal, and apical locations. No ischemia-->low risk.   Hyperlipidemia    Hypertension    Insomnia    Lipoma    Migraine    Mild Mitral regurgitation    a. 07/2016 Echo: EF 55-65%, no rwma, mild MR. Mildly dil LA. Nl RV fx.    Osteoarthritis    left knee   Osteopenia    Peptic ulcer disease    a. H. pylori   Sleep apnea    does not use her cpap   Tendonitis    Thyroid disease    Vitamin D deficiency     SURGICAL HISTORY: Past Surgical History:  Procedure Laterality Date   ABDOMINAL HYSTERECTOMY     ovaries left   ACHILLES TENDON SURGERY     2012,01/2017   BACK SURGERY     BREAST BIOPSY Right 01/15/2021   affirm bx- "X" clip-path pending   CARDIOVERSION N/A 09/07/2016   Procedure: CARDIOVERSION;  Surgeon: Minna Merritts, MD;  Location: [facility] ORS;  Service: Cardiovascular;  Laterality: N/A;   CARDIOVERSION N/A 10/19/2016   Procedure: CARDIOVERSION;  Surgeon:  Minna Merritts, MD;  Location: [facility];  Service: Cardiovascular;  Laterality: N/A;   CARDIOVERSION N/A 12/14/2016   Procedure: CARDIOVERSION;  Surgeon: Minna Merritts, MD;  Location: [facility];  Service: Cardiovascular;  Laterality: N/A;   COLONOSCOPY     COLONOSCOPY WITH PROPOFOL N/A 05/13/2020   Procedure: COLONOSCOPY WITH PROPOFOL;  Surgeon: Toledo, Benay Pike, MD;  Location: [facility];  Service: Gastroenterology;  Laterality: N/A;  ELIQUIS   ESOPHAGOGASTRODUODENOSCOPY     ESOPHAGOGASTRODUODENOSCOPY (EGD) WITH PROPOFOL N/A 05/13/2020   Procedure: ESOPHAGOGASTRODUODENOSCOPY (EGD) WITH PROPOFOL;  Surgeon: Toledo, Benay Pike, MD;  Location: [facility];  Service: Gastroenterology;  Laterality: N/A;   FOOT SURGERY     JOINT REPLACEMENT     THYROIDECTOMY     thyroid lobectomy secondary to a benign nodule   TOTAL HIP ARTHROPLASTY Right 08/21/2018   Procedure: TOTAL HIP ARTHROPLASTY ANTERIOR APPROACH-RIGHT CELL SAVER REQUESTED;  Surgeon: [name], MD;  Location: [facility];  Service: Orthopedics;  Laterality: Right;    SOCIAL HISTORY:  Social History   Socioeconomic History   Marital status: Married    Spouse name: jeffrey   Number of children: 1   Years of education: Not on file   Highest education level: High school graduate  Occupational History    Comment: disabled  Tobacco Use   Smoking status: Former    Packs/day: 0.25    Years: 10.00    Pack years: 2.50    Types: Cigarettes    Quit date: 2005    Years since quitting: 17.5   Smokeless tobacco: Never   Tobacco comments:    smoked for 10 years, 1 pack every 2-3 days  Vaping Use   Vaping Use: Never used  Substance and Sexual Activity   Alcohol use: Not Currently    Comment: social, occasional   Drug use: No   Sexual activity: Not Currently  Other Topics Concern   Not on file  Social History Narrative   Moved to [place] from [place], originally from [place].    1 cup of caffeine  daily   Right handed          Social Determinants of Health   Financial Resource Strain: Low Risk    Difficulty of Paying Living Expenses: Not very hard  Food Insecurity: No Food Insecurity   Worried About Charity fundraiser in the Last Year: Never true   Ran Out of Food in the Last Year: Never true  Transportation Needs: No Transportation Needs   Lack of Transportation (Medical): No   Lack of Transportation (Non-Medical): No  Physical Activity: Unknown   Days of Exercise per Week: 0 days   Minutes of Exercise per Session: Not on file  Stress: Stress Concern Present   Feeling of Stress : Rather much  Social Connections: Moderately Isolated   Frequency of Communication with Friends and Family: Never   Frequency of Social Gatherings with Friends and Family: Once a week   Attends Religious Services: More than 4 times per year   Active Member of [employer] or Organizations: No   Attends Music therapist: Never   Marital Status: Married  Human resources officer Violence: Not At Risk   Fear of Current or Ex-Partner: No   Emotionally Abused: No   Physically Abused: No   Sexually Abused: No    FAMILY HISTORY: Family History  Problem Relation Age of Onset   Cancer Sister 78       ovarian   Depression Sister    Cancer Brother        CLL   Cancer Father 23       colon   Heart disease Father    Heart attack Father 60       died from MI   Hyperlipidemia Father    Hypertension Father    Cancer Mother        leukemia   Breast cancer Paternal Aunt        74's   Migraines Neg Hx     ALLERGIES:  is allergic to prednisone, amiodarone, hydrocodone-acetaminophen, oxycodone, tapentadol, tramadol, adhesive [tape], and latex.  MEDICATIONS:  Current Outpatient Medications  Medication Sig Dispense Refill   acetaminophen (TYLENOL) 500 MG tablet Take 500 mg by mouth every 6 (six) hours as needed. Taking as needed     albuterol (VENTOLIN HFA) 108 (90 Base) MCG/ACT inhaler INHALE 2  PUFFS INTO THE LUNGS EVERY 8 (EIGHT) HOURS AS NEEDED FOR WHEEZING OR SHORTNESS OF BREATH. 6.7 g 0   ALPRAZolam (  XANAX) 0.25 MG tablet TAKE 1 TABLET BY MOUTH DAILY AS NEEDED FOR ANXIETY OR SLEEP. 30 tablet 1   azelastine (ASTELIN) 0.1 % nasal spray Place into both nostrils as needed for rhinitis. Use in each nostril as directed     B Complex Vitamins (VITAMIN B COMPLEX PO) Take 1 tablet by mouth daily.      bisoprolol (ZEBETA) 5 MG tablet TAKE 1 TABLET BY MOUTH IN THE MORNING AND MAY TAKE AN EXTRA IN THE EVENING IF NEEDED FOR HEART RATE (Patient taking differently: Take 5 mg by mouth 2 (two) times daily.) 180 tablet 0   busPIRone (BUSPAR) 5 MG tablet TAKE 1 TABLET BY MOUTH THREE TIMES A DAY AS NEEDED 60 tablet 1   calcium gluconate 500 MG tablet Take 500 tablets by mouth daily.     calcium-vitamin D (OSCAL WITH D) 500-200 MG-UNIT tablet Take 1 tablet by mouth daily with breakfast.     Cholecalciferol (VITAMIN D3) 5000 units CAPS Take 5,000 Units by mouth daily.     DULoxetine (CYMBALTA) 30 MG capsule Take one 30 mg tablet by mouth once a day for the first week. Then increase to two 30 mg tablets ( total 55m) by mouth once daily. 60 capsule 3   ELIQUIS 5 MG TABS tablet TAKE 1 TABLET BY MOUTH TWICE A DAY 180 tablet 1   esomeprazole (NEXIUM) 20 MG capsule Take 20 mg by mouth as needed.     furosemide (LASIX) 20 MG tablet TAKE 1 TABLET BY MOUTH TWICE A DAY 180 tablet 0   hydrochlorothiazide (MICROZIDE) 12.5 MG capsule TAKE 1 CAPSULE BY MOUTH EVERY DAY 90 capsule 2   losartan (COZAAR) 100 MG tablet Take 1 tablet (100 mg total) by mouth daily. PLEASE CALL OFFICE TO SCHEDULE AN APPOINTMENT FOR FURTHER REFILLS 90 tablet 0   Multiple Vitamin (MULTIVITAMIN) tablet Take 1 tablet by mouth daily.     potassium chloride (KLOR-CON) 10 MEQ tablet TAKE 1 TABLET TWICE DAILY. PLEASE MAKE APPT FOR FUTURE REFILLS. 180 tablet 0   ranolazine (RANEXA) 500 MG 12 hr tablet TAKE 1 TABLET BY MOUTH TWICE A DAY 180 tablet 3    rizatriptan (MAXALT) 10 MG tablet TAKE 1 TABLET (10 MG TOTAL) BY MOUTH AS NEEDED FOR MIGRAINE. MAY REPEAT IN 2 HOURS IF NEEDED 10 tablet 0   rOPINIRole (REQUIP) 1 MG tablet Take 1 tablet (1 mg total) by mouth at bedtime. 90 tablet 1   rosuvastatin (CRESTOR) 20 MG tablet TAKE 1 TABLET BY MOUTH EVERY DAY 90 tablet 0   No current facility-administered medications for this visit.     PHYSICAL EXAMINATION: ECOG PERFORMANCE STATUS: 1 - Symptomatic but completely ambulatory Vitals:   01/19/21 1341  BP: (!) 146/64  Pulse: (!) 55  Resp: 18  Temp: 98.1 F (36.7 C)   Filed Weights   01/19/21 1341  Weight: 295 lb 9.6 oz (134.1 kg)    Physical Exam Constitutional:      General: She is not in acute distress.    Appearance: She is obese.  HENT:     Head: Normocephalic and atraumatic.  Eyes:     General: No scleral icterus. Cardiovascular:     Rate and Rhythm: Normal rate and regular rhythm.     Heart sounds: Normal heart sounds.  Pulmonary:     Effort: Pulmonary effort is normal. No respiratory distress.     Breath sounds: No wheezing.  Abdominal:     General: Bowel sounds are normal. There is no  distension.     Palpations: Abdomen is soft.  Musculoskeletal:        General: No deformity. Normal range of motion.     Cervical back: Normal range of motion and neck supple.  Skin:    General: Skin is warm and dry.     Findings: No erythema or rash.  Neurological:     Mental Status: She is alert and oriented to person, place, and time. Mental status is at baseline.     Cranial Nerves: No cranial nerve deficit.     Coordination: Coordination normal.  Psychiatric:        Mood and Affect: Mood normal.   Breast exam was performed in seated position while patient sits in the wheelchair. Patient is status post right breast biopsy.  She has focal bruising at the site of biopsy.  I can palpate some soft tissue masslike swelling which probably is due to biopsy.  No palpable lymphadenopathy in  both axilla.  No palpable left breast mass . LABORATORY DATA:  I have reviewed the data as listed Lab Results  Component Value Date   WBC 6.3 01/19/2021   HGB 14.3 01/19/2021   HCT 43.0 01/19/2021   MCV 90.9 01/19/2021   PLT 183 01/19/2021   Recent Labs    03/25/20 0802 09/22/20 0950 01/19/21 1423  NA 138 141 138  K 4.4 4.0 3.5  CL 100 100 98  CO2 27 30 29   GLUCOSE 82 98 91  BUN 19 16 14   CREATININE 0.83 0.89 0.73  CALCIUM 9.0 9.2 8.8*  GFRNONAA  --   --  >60  PROT 6.9  --  8.1  ALBUMIN 4.1  --  4.1  AST 20  --  21  ALT 22  --  17  ALKPHOS 79  --  75  BILITOT 0.7  --  0.8    Iron/TIBC/Ferritin/ %Sat    Component Value Date/Time   IRON 109 05/21/2020 1059   FERRITIN 34.5 05/21/2020 1059   IRONPCTSAT 25.3 05/21/2020 1059      RADIOGRAPHIC STUDIES: I have personally reviewed the radiological images as listed and agreed with the findings in the report. US BREAST LTD UNI RIGHT INC AXILLA  Result Date: 01/08/2021 CLINICAL DATA:  Patient recalled from screening for right breast masses and asymmetry. EXAM: DIGITAL DIAGNOSTIC UNILATERAL RIGHT MAMMOGRAM WITH TOMOSYNTHESIS AND CAD; ULTRASOUND RIGHT BREAST LIMITED TECHNIQUE: Right digital diagnostic mammography and breast tomosynthesis was performed. The images were evaluated with computer-aided detection.; Targeted ultrasound examination of the right breast was performed COMPARISON:  Previous exam(s). ACR Breast Density Category b: There are scattered areas of fibroglandular density. FINDINGS: Within the upper-outer right breast middle depth there are two adjacent irregular spiculated masses, the largest measuring 10 mm and the smaller more medial mass measuring 5 mm. Questioned asymmetry within the outer periareolar right breast resolved with additional imaging compatible with dense tissue. No additional concerning findings. Targeted ultrasound is performed, showing a 10 x 10 x 7 mm irregular hypoechoic mass right breast 10  o'clock position 9 cm from nipple. The additional mass within the upper-outer right breast seen on mammography is not able to be identified with ultrasound. No right axillary adenopathy. IMPRESSION: Suspicious right breast mass 10 o'clock position 9 cm from nipple identified on ultrasound. There is an adjacent smaller satellite mass within the upper-outer right breast demonstrated on mammography but not well identified with ultrasound. RECOMMENDATION: Ultrasound-guided core needle biopsy suspicious right breast mass 10 o'clock position 9 cm from  nipple. Stereotactic guided core needle biopsy smaller adjacent satellite nodule upper outer right breast. Consider doing the stereotactic guided core needle biopsy first so that the ultrasound biopsy of the larger mass does not obscure the smaller satellite mass. I have discussed the findings and recommendations with the patient. If applicable, a reminder letter will be sent to the patient regarding the next appointment. BI-RADS CATEGORY  5: Highly suggestive of malignancy. Electronically Signed   By: Lovey Newcomer M.D.   On: 01/08/2021 12:07  MM DIAG BREAST TOMO UNI RIGHT  Result Date: 01/08/2021 CLINICAL DATA:  Patient recalled from screening for right breast masses and asymmetry. EXAM: DIGITAL DIAGNOSTIC UNILATERAL RIGHT MAMMOGRAM WITH TOMOSYNTHESIS AND CAD; ULTRASOUND RIGHT BREAST LIMITED TECHNIQUE: Right digital diagnostic mammography and breast tomosynthesis was performed. The images were evaluated with computer-aided detection.; Targeted ultrasound examination of the right breast was performed COMPARISON:  Previous exam(s). ACR Breast Density Category b: There are scattered areas of fibroglandular density. FINDINGS: Within the upper-outer right breast middle depth there are two adjacent irregular spiculated masses, the largest measuring 10 mm and the smaller more medial mass measuring 5 mm. Questioned asymmetry within the outer periareolar right breast resolved with  additional imaging compatible with dense tissue. No additional concerning findings. Targeted ultrasound is performed, showing a 10 x 10 x 7 mm irregular hypoechoic mass right breast 10 o'clock position 9 cm from nipple. The additional mass within the upper-outer right breast seen on mammography is not able to be identified with ultrasound. No right axillary adenopathy. IMPRESSION: Suspicious right breast mass 10 o'clock position 9 cm from nipple identified on ultrasound. There is an adjacent smaller satellite mass within the upper-outer right breast demonstrated on mammography but not well identified with ultrasound. RECOMMENDATION: Ultrasound-guided core needle biopsy suspicious right breast mass 10 o'clock position 9 cm from nipple. Stereotactic guided core needle biopsy smaller adjacent satellite nodule upper outer right breast. Consider doing the stereotactic guided core needle biopsy first so that the ultrasound biopsy of the larger mass does not obscure the smaller satellite mass. I have discussed the findings and recommendations with the patient. If applicable, a reminder letter will be sent to the patient regarding the next appointment. BI-RADS CATEGORY  5: Highly suggestive of malignancy. Electronically Signed   By: Lovey Newcomer M.D.   On: 01/08/2021 12:07  MM 3D SCREEN BREAST BILATERAL  Result Date: 12/30/2020 CLINICAL DATA:  Screening. EXAM: DIGITAL SCREENING BILATERAL MAMMOGRAM WITH TOMOSYNTHESIS AND CAD TECHNIQUE: Bilateral screening digital craniocaudal and mediolateral oblique mammograms were obtained. Bilateral screening digital breast tomosynthesis was performed. The images were evaluated with computer-aided detection. COMPARISON:  Previous exam(s). ACR Breast Density Category b: There are scattered areas of fibroglandular density. FINDINGS: In the right breast, possible masses and asymmetry warrant further evaluation. In the left breast, no findings suspicious for malignancy. IMPRESSION: Further  evaluation is suggested for a possible masses and asymmetry in the right breast. RECOMMENDATION: Diagnostic mammogram and possibly ultrasound of the right breast. (Code:[id number]) The patient will be contacted regarding the findings, and additional imaging will be scheduled. BI-RADS CATEGORY  0: Incomplete. Need additional imaging evaluation and/or prior mammograms for comparison. Electronically Signed   By: Kristopher Oppenheim M.D.   On: 12/30/2020 16:11  MM CLIP PLACEMENT RIGHT  Result Date: 01/15/2021 CLINICAL DATA:  Confirmation of clip placement after stereotactic tomosynthesis core needle biopsy and ultrasound-guided core needle biopsy of 2 adjacent masses in the UPPER OUTER QUADRANT of the RIGHT breast. EXAM: 2D and 3D DIAGNOSTIC RIGHT  MAMMOGRAM POST ULTRASOUND AND STEREOTACTIC TOMOSYNTHESIS BIOPSY COMPARISON:  Previous exam(s). FINDINGS: Tomosynthesis and synthesized full field CC and mediolateral images were obtained following stereotactic tomosynthesis guided biopsy and ultrasound guided biopsy of 2 adjacent masses in the UPPER OUTER QUADRANT of the RIGHT breast. The X shaped biopsy marking clip is appropriately position within or immediately adjacent to the biopsied 0.5 cm mass in the [place]. The Venus biopsy marking clip is appropriately position within the biopsied 1.0 cm mass in the [place]. The clips are separated by approximately 1.7 cm. Expected post biopsy changes are present without evidence of hematoma. IMPRESSION: 1. Appropriate positioning of the X shaped biopsy marking clip at the site of the biopsied 0.5 cm mass in the UPPER OUTER QUADRANT of the RIGHT breast. 2. Appropriate positioning of the Venus biopsy marking clip within the biopsied 1.0 cm mass in the UPPER OUTER QUADRANT of the RIGHT breast. 3. The clips are separated by approximately 1.7 cm. Final Assessment: Post Procedure Mammograms for Marker Placement Electronically Signed   By: Evangeline Dakin M.D.   On:  01/15/2021 09:36  MM RT BREAST BX W LOC DEV 1ST LESION IMAGE BX SPEC STEREO GUIDE  Addendum Date: 01/18/2021   ADDENDUM REPORT: 01/18/2021 12:26 ADDENDUM: PATHOLOGY revealed: Site A. RIGHT BREAST; STEREOTACTIC BIOPSY: - INVASIVE MAMMARY CARCINOMA, NO SPECIAL TYPE. 5 mm in this sample. Grade 2. Ductal carcinoma in situ: Present. Lymphovascular invasion: Not identified. Pathology results are CONCORDANT with imaging findings, per Dr. Peggye Fothergill. PATHOLOGY revealed: Site B. RIGHT BREAST, UPPER OUTER QUADRANT 10:00, 9 CMFN; ULTRASOUND-GUIDED BIOPSY: - INVASIVE MAMMARY CARCINOMA, AT LEAST 7 MM, SIMILAR TO THAT DESCRIBED ABOVE (SEE PART A). Pathology results are CONCORDANT with imaging findings, per Dr. Peggye Fothergill. Pathology results and recommendations below were discussed with patient by telephone on 01/18/2021. Patient reported no biopsy site issues apart from slight tenderness at the site. Post biopsy care instructions were reviewed, questions were answered and my direct phone number was provided to patient. Patient was instructed to call [facility] if any concerns or questions arise related to the biopsy. Recommend: Surgical consultation. Request for surgical consultation relayed to Al Pimple RN and Tanya Nones RN at [facility] by Electa Sniff RN on 01/18/2021. Pathology results reported by Electa Sniff RN on 01/18/2021. Electronically Signed   By: Evangeline Dakin M.D.   On: 01/18/2021 12:26   Result Date: 01/18/2021 CLINICAL DATA:  70 year old with screening detected adjacent suspicious masses in the UPPER OUTER QUADRANT of the RIGHT breast the larger mass measures approximately 1.0 cm and was visible at ultrasound. The smaller mass measures 0.5 cm and was not visible at ultrasound. Biopsy of both masses is performed. EXAM: RIGHT BREAST STEREOTACTIC CORE NEEDLE BIOPSY ULTRASOUND GUIDED RIGHT BREAST CORE NEEDLE BIOPSY COMPARISON:  Previous exams. FINDINGS: The patient and I  discussed the procedure of stereotactic-guided biopsy including benefits and alternatives. We discussed the high likelihood of a successful procedure. We discussed the risks of the procedure including infection, bleeding, tissue injury, clip migration, and inadequate sampling. Informed written consent was given. The usual time out protocol was performed immediately prior to the procedure. # 1) Stereotactic biopsy of the 0.5 cm mass, lesion quadrant: UPPER OUTER QUADRANT. Using sterile technique with chlorhexidine as skin antisepsis, 1% lidocaine and 1% lidocaine with epinephrine as local anesthetic, under stereotactic guidance, a 9 gauge Brevera vacuum assisted device was used to perform core needle biopsy of the 0.5 cm mass in the UPPER  OUTER QUADRANT of the RIGHT breast using a superior approach. Specimen radiograph was performed showing soft tissue density in multiple core samples. At the conclusion of the procedure, an X shaped tissue marker clip was deployed into the biopsy cavity. # 2) Ultrasound guided biopsy of the 1.0 cm mass, lesion quadrant: UPPER OUTER QUADRANT. Using sterile technique with chlorhexidine as skin antisepsis, 1% lidocaine and 1% lidocaine with epinephrine as local anesthetic, under direct ultrasound visualization, a 12 gauge Bard Marquee core needle device placed through an 11 gauge introducer needle was used perform biopsy of the 1.0 cm mass in the UPPER OUTER QUADRANT of the RIGHT breast using an inferolateral approach. At the conclusion of the procedure, a Venus tissue marker clip was deployed into the biopsy cavity. Follow-up 2-view mammogram was performed and dictated separately. IMPRESSION: 1. Stereotactic-guided core needle biopsy of a 0.5 cm mass involving the UPPER OUTER QUADRANT of the RIGHT breast (which was not visible on diagnostic ultrasound). 2. Ultrasound-guided core needle biopsy of a 1.0 cm mass involving the UPPER OUTER QUADRANT of the RIGHT breast. 3. No apparent  complications. Electronically Signed: By: Evangeline Dakin M.D. On: 01/15/2021 09:38   [place] RT BREAST BX W LOC DEV 1ST LESION IMG BX SPEC US GUIDE  Addendum Date: 01/18/2021   ADDENDUM REPORT: 01/18/2021 12:26 ADDENDUM: PATHOLOGY revealed: Site A. RIGHT BREAST; STEREOTACTIC BIOPSY: - INVASIVE MAMMARY CARCINOMA, NO SPECIAL TYPE. 5 mm in this sample. Grade 2. Ductal carcinoma in situ: Present. Lymphovascular invasion: Not identified. Pathology results are CONCORDANT with imaging findings, per Dr. Peggye Fothergill. PATHOLOGY revealed: Site B. RIGHT BREAST, UPPER OUTER QUADRANT 10:00, 9 CMFN; ULTRASOUND-GUIDED BIOPSY: - INVASIVE MAMMARY CARCINOMA, AT LEAST 7 MM, SIMILAR TO THAT DESCRIBED ABOVE (SEE PART A). Pathology results are CONCORDANT with imaging findings, per Dr. Peggye Fothergill. Pathology results and recommendations below were discussed with patient by telephone on 01/18/2021. Patient reported no biopsy site issues apart from slight tenderness at the site. Post biopsy care instructions were reviewed, questions were answered and my direct phone number was provided to patient. Patient was instructed to call [facility] if any concerns or questions arise related to the biopsy. Recommend: Surgical consultation. Request for surgical consultation relayed to Al Pimple RN and Tanya Nones RN at [facility] by Electa Sniff RN on 01/18/2021. Pathology results reported by Electa Sniff RN on 01/18/2021. Electronically Signed   By: Evangeline Dakin M.D.   On: 01/18/2021 12:26   Result Date: 01/18/2021 CLINICAL DATA:  70 year old with screening detected adjacent suspicious masses in the UPPER OUTER QUADRANT of the RIGHT breast the larger mass measures approximately 1.0 cm and was visible at ultrasound. The smaller mass measures 0.5 cm and was not visible at ultrasound. Biopsy of both masses is performed. EXAM: RIGHT BREAST STEREOTACTIC CORE NEEDLE BIOPSY ULTRASOUND GUIDED RIGHT BREAST CORE NEEDLE  BIOPSY COMPARISON:  Previous exams. FINDINGS: The patient and I discussed the procedure of stereotactic-guided biopsy including benefits and alternatives. We discussed the high likelihood of a successful procedure. We discussed the risks of the procedure including infection, bleeding, tissue injury, clip migration, and inadequate sampling. Informed written consent was given. The usual time out protocol was performed immediately prior to the procedure. # 1) Stereotactic biopsy of the 0.5 cm mass, lesion quadrant: UPPER OUTER QUADRANT. Using sterile technique with chlorhexidine as skin antisepsis, 1% lidocaine and 1% lidocaine with epinephrine as local anesthetic, under stereotactic guidance, a 9 gauge Brevera vacuum assisted device was used to perform core  needle biopsy of the 0.5 cm mass in the UPPER OUTER QUADRANT of the RIGHT breast using a superior approach. Specimen radiograph was performed showing soft tissue density in multiple core samples. At the conclusion of the procedure, an X shaped tissue marker clip was deployed into the biopsy cavity. # 2) Ultrasound guided biopsy of the 1.0 cm mass, lesion quadrant: UPPER OUTER QUADRANT. Using sterile technique with chlorhexidine as skin antisepsis, 1% lidocaine and 1% lidocaine with epinephrine as local anesthetic, under direct ultrasound visualization, a 12 gauge Bard Marquee core needle device placed through an 11 gauge introducer needle was used perform biopsy of the 1.0 cm mass in the UPPER OUTER QUADRANT of the RIGHT breast using an inferolateral approach. At the conclusion of the procedure, a Venus tissue marker clip was deployed into the biopsy cavity. Follow-up 2-view mammogram was performed and dictated separately. IMPRESSION: 1. Stereotactic-guided core needle biopsy of a 0.5 cm mass involving the UPPER OUTER QUADRANT of the RIGHT breast (which was not visible on diagnostic ultrasound). 2. Ultrasound-guided core needle biopsy of a 1.0 cm mass involving the  UPPER OUTER QUADRANT of the RIGHT breast. 3. No apparent complications. Electronically Signed: By: Evangeline Dakin M.D. On: 01/15/2021 09:38      ASSESSMENT & PLAN:  1. Malignant neoplasm of upper-outer quadrant of right female breast, unspecified estrogen receptor status ([place])   2. Family history of ovarian cancer   3. Goals of care, counseling/discussion   Cancer Staging Malignant neoplasm of upper-outer quadrant of right female breast [facility]) Staging form: Breast, AJCC 8th Edition - Clinical stage from 01/19/2021: Stage IA (cT1, cN0, cM0, G2, ER-, PR+, HER2+) - Signed by [name], MD on 01/23/2021  #Right breast invasive carcinoma cT1b or T1c N0 ER negative, PR weakly HER2 positive positive, HER 2 positive.  2 foci adjacent to each other. Stage 1 ER-, HER2 + disease,  I reviewed images with radiologist, the 2 breast masses are very close to each other, patient most likely have cT1b disease, or cT1c disease.  Her breast is fatty and well evaluated on mammogram.  I do not think MRI breast would add any additional information to change her plan.  As her disease is less than 2 cm, I favor upfront surgical resection followed by adjuvant chemotherapy/HER2 targeted therapy. Discussed with Dr. [name] who agrees with the plan. I asked our breast cancer nurse navigator to give patient a call and inform her.  I attempted to call her earlier today and not able to reach her. See patient 2 weeks after the surgery to go over final pathology specimen and plan of chemotherapy.  Strong family history of cancer, patient declined genetic testing. Orders Placed This Encounter  Procedures   CBC with Differential    Standing Status:   Future    Number of Occurrences:   1    Standing Expiration Date:   01/19/2022   Comprehensive metabolic panel    Standing Status:   Future    Number of Occurrences:   1    Standing Expiration Date:   01/19/2022   Cancer antigen 15-3    Standing Status:   Future    Number  of Occurrences:   1    Standing Expiration Date:   01/19/2022   Cancer antigen 27.29    Standing Status:   Future    Number of Occurrences:   1    Standing Expiration Date:   01/19/2022    All questions were answered. The patient knows  to call the clinic with any problems questions or concerns.  cc Arnett, Yvetta Coder, FNP      [name], MD, PhD Hematology Oncology [facility] at [facility] Pager- [phone] 01/19/2021

## 2021-01-19 NOTE — Progress Notes (Signed)
Patient here for follow up. Reports soreness to right breast

## 2021-01-19 NOTE — Telephone Encounter (Signed)
Spoke with pt regarding breast cancer diagnosis She is seeing dr Tasia Catchings today and dr [name] later this week.  She overall feels well. Continues to have anxiety and has started cymbalta 3 days ago  She will let me know if she needs anything at all.

## 2021-01-19 NOTE — Telephone Encounter (Signed)
close

## 2021-01-19 NOTE — Progress Notes (Signed)
Met patient and her daughter in law today during her initial medical oncology consult with Dr. Tasia Catchings.  Gave patient breast cancer educational literature, "My Breast Cancer Treatment Handbook" by Josephine Igo, RN.    Patient encouraged to call with any questions or needs.  Dr. Tasia Catchings would like patients case discussed in tumor board.  Will add to case conference list.

## 2021-01-20 ENCOUNTER — Telehealth: Payer: Self-pay | Admitting: Cardiovascular Disease

## 2021-01-20 LAB — CANCER ANTIGEN 27.29: CA 27.29: 19.9 U/mL (ref 0.0–38.6)

## 2021-01-20 LAB — CANCER ANTIGEN 15-3: CA 15-3: 19.2 U/mL (ref 0.0–25.0)

## 2021-01-20 NOTE — Telephone Encounter (Signed)
Spoke with patient and she reports going into afib with HR in 110's. Blood pressure 126/85 then rate was 119. She just found out that she has breast cancer and she feels like this is anxiety that caused shortness of breath. She has COPD as well. Reviewed medications with her and she has xanax which she has not taken today. Encouraged her to try that and see if she has some relief of her symptoms. She has mentioned to her primary provider she needs this increased but that PCP wanted input from cardiology. Inquired if she has had any chest pain, worsening of her shortness of breath from her baseline, and any other symptoms. She denied any of these symptoms and shortness of breath is about the same. Given that she can feel when she went into afib and is now out offered appointment so we can review EKG. She was agreeable with this and scheduled her to come in for visit. She will call if she goes back into afib or has any new or worsening symptoms. Reviewed signs and symptoms that would require evaluation in ED. She verbalized understanding of our conversation, agreement with plan, and had no further questions at this time.

## 2021-01-20 NOTE — Telephone Encounter (Signed)
Patient c/o Palpitations:  High priority if patient c/o lightheadedness, shortness of breath, or chest pain  How long have you had palpitations/irregular HR/ Afib? Are you having the symptoms now? Yes as of today  Are you currently experiencing lightheadedness, SOB or CP? SOB from anxiety, a little dizzy  Do you have a history of afib (atrial fibrillation) or irregular heart rhythm? yes  Have you checked your BP or HR? (document readings if available): 123/81, HR 119  Are you experiencing any other symptoms? No

## 2021-01-21 ENCOUNTER — Ambulatory Visit: Payer: Self-pay | Admitting: Surgery

## 2021-01-21 ENCOUNTER — Other Ambulatory Visit: Payer: Self-pay

## 2021-01-21 ENCOUNTER — Ambulatory Visit: Payer: Medicare Other | Admitting: Surgery

## 2021-01-21 ENCOUNTER — Other Ambulatory Visit: Payer: Self-pay | Admitting: Surgery

## 2021-01-21 VITALS — BP 117/77 | HR 67 | Temp 98.4°F | Ht 65.0 in | Wt 294.2 lb

## 2021-01-21 DIAGNOSIS — C50411 Malignant neoplasm of upper-outer quadrant of right female breast: Secondary | ICD-10-CM

## 2021-01-21 NOTE — Patient Instructions (Addendum)
Our surgery scheduler Pamala Hurry will call you within 24-48 hours to get you scheduled. If you have not heard from her after 48 hours, please call our office. You will not need to get Covid tested before surgery and have the blue sheet available when she calls to write down important information.   If you have any concerns or questions, please feel free to call our office.   Breast Cancer, Female  Breast cancer is a malignant growth of tissue (tumor) in the breast. Unlike noncancerous (benign) tumors, malignant tumors are cancerous and can spread to other parts of the body. The two most common types of breast cancer start in the milk ducts (ductal carcinoma) or in the lobules where milk is made in the breast (lobular carcinoma). Breast cancer is one of the most common types of cancer in women. What are the causes? The exact cause of female breast cancer is unknown. What increases the risk? The following factors may make you more likely to develop this condition: Being older than 68 years of age. Race and ethnicity. Caucasian women generally have an increased risk, but African-American women are more likely to develop the disease before age 18. Having a family history of breast cancer. Having had breast cancer in the past. Having certain noncancerous conditions of the breast, such as dense breast tissue. Having the BRCA1 and BRCA2 genes. Having a history of radiation exposure. Obesity. Starting menopause after age 38. Starting your menstrual periods before age 11. Having never been pregnant or having your first child after age 23. Having never breastfed. Using hormone therapy after menopause. Using birth control pills. Drinking more than one alcoholic drink a day. Exposure to the drug DES, which was given to pregnant women from the 1940s to the 1970s. What are the signs or symptoms? Symptoms of this condition include: A painless lump or thickening in your breast. Changes in the size or  shape of your breast. Breast skin changes, such as puckering or dimpling. Nipple abnormalities, such as scaling, crustiness, redness, or pulling in (retraction). Nipple discharge that is bloody or clear. How is this diagnosed? This condition may be diagnosed by: Taking your medical history and doing a physical exam. During the exam, your health care provider will feel the tissue around your breast and under your arms. Taking a sample of nipple discharge. The sample will be examined under a microscope. Performing imaging tests, such as breast X-rays (mammogram), breast ultrasound exams, or an MRI. Taking a tissue sample (biopsy) from the breast. The sample will be examined under a microscope to look for cancer cells. Taking a sample from the lymph nodes near the affected breast (sentinel node biopsy). Your cancer will be staged to determine its severity and extent. Staging is a careful attempt to find out the size of the tumor, whether the cancer has spread, and if so, to what parts of the body. Staging also includes testing your tumor for certain receptors, such as estrogen, progesterone, and human epidermal growth factor receptor 2 (HER2). This will help your cancer care team decide on a treatment that will work best for you. You may need to have more tests to determine the stage of your cancer. Stages include the following: Stage 0--The tumor has not spread to other breast tissue. Stage I--The cancer is only found in the breast or may be in the lymph nodes. The tumor may be up to  in (2 cm) wide. Stage II--The cancer has spread to nearby lymph nodes. The tumor  may be up to 2 in (5 cm) wide. Stage III--The cancer has spread to more distant lymph nodes. The tumor may be larger than 2 in (5 cm) wide. Stage IV--The cancer has spread to other parts of the body, such as the bones, brain, liver, or lungs. How is this treated? Treatment for this condition depends on the type and stage of the breast  cancer. It may be treated with: Surgery. This may involve breast-conserving surgery (lumpectomy or partial mastectomy) in which only the part of the breast containing the cancer is removed. Some normal tissue surrounding this area may also be removed. In some cases, surgery may be done to remove the entire breast (mastectomy) and nipple. Lymph nodes may also be removed. Radiation therapy, which uses high-energy rays to kill cancer cells. Chemotherapy, which is the use of drugs to kill cancer cells. Hormone therapy, which involves taking medicine to adjust the hormone levels in your body. You may take medicine to decrease your estrogen levels. This can help stop cancer cells from growing. Targeted therapy, in which drugs are used to block the growth and spread of cancer cells. These drugs target a specific part of the cancer cell and usually cause fewer side effects than chemotherapy. Targeted therapy may be used alone or in combination with chemotherapy. A combination of surgery, radiation, chemotherapy, or hormone therapy may be needed to treat breast cancer. Follow these instructions at home: Take over-the-counter and prescription medicines only as told by your health care provider. Eat a healthy diet. A healthy diet includes lots of fruits and vegetables, low-fat dairy products, lean meats, and fiber. Make sure half your plate is filled with fruits or vegetables. Choose high-fiber foods such as whole-grain breads and cereals. Consider joining a support group. This may help you learn to cope with the stress of having breast cancer. Talk to your health care team about exercise and physical activity. The right exercise program can: Help prevent or reduce symptoms such as fatigue or depression. Improve overall health and survival rates. Keep all follow-up visits as told by your health care provider. This is important. Where to find more information American Cancer Society: www.cancer.[place]: www.cancer.gov Contact a health care provider if: You have a sudden increase in pain. You have any symptoms or changes that concern you. You lose weight without trying. You notice a new lump in either breast or under your arm. You develop swelling in either arm or hand. You have a fever. You notice new fatigue or weakness. Get help right away if: You have chest pain or trouble breathing. You faint. Summary Breast cancer is a malignant growth of tissue (tumor) in the breast. Your cancer will be staged to determine its severity and extent. Treatment for this condition depends on the type and stage of the breast cancer. This information is not intended to replace advice given to you by your health care provider. Make sure you discuss any questions you have with your healthcare provider.  Lumpectomy  A lumpectomy, sometimes called a partial mastectomy, is surgery to remove a cancerous tumor or mass (the lump) from a breast. It is a form of breast-conserving or breast-preservation surgery. This means that the canceroustissue is removed but the breast remains intact. During a lumpectomy, the portion of the breast that contains the tumor is removed. Some normal tissue around the lump may be taken out to make sure that all of the tumor has been removed. Lymph nodes under your arm may  also be removed and tested to find out if the cancer has spread. Lymph nodes are part of the body's disease-fighting system (immune system) and are usually the first place where breast cancer spreads. Tell a health care provider about: Any allergies you have. All medicines you are taking, including vitamins, herbs, eye drops, creams, and over-the-counter medicines. Any problems you or family members have had with anesthetic medicines. Any blood disorders you have. Any surgeries you have had. Any medical conditions you have. Whether you are pregnant or may be pregnant. What are the  risks? Generally, this is a safe procedure. However, problems may occur, including: Bleeding. Infection. Allergic reaction to medicines. Pain, swelling, weakness, or numbness in the arm on the side of your surgery. Temporary swelling. Change in the shape of the breast, particularly if a large portion is removed. Scar tissue that forms at the surgical site and feels hard to the touch. Blood clots. What happens before the procedure? Staying hydrated Follow instructions from your health care provider about hydration, which may include: Up to 2 hours before the procedure - you may continue to drink clear liquids, such as water, clear fruit juice, black coffee, and plain tea.  Eating and drinking restrictions Follow instructions from your health care provider about eating and drinking, which may include: 8 hours before the procedure - stop eating heavy meals or foods, such as meat, fried foods, or fatty foods. 6 hours before the procedure - stop eating light meals or foods, such as toast or cereal. 6 hours before the procedure - stop drinking milk or drinks that contain milk. 2 hours before the procedure - stop drinking clear liquids. Medicines Ask your health care provider about: Changing or stopping your regular medicines. This is especially important if you are taking diabetes medicines or blood thinners. Taking medicines such as aspirin and ibuprofen. These medicines can thin your blood. Do not take these medicines unless your health care provider tells you to take them. Taking over-the-counter medicines, vitamins, herbs, and supplements. General instructions Prior to surgery, your health care provider may do a procedure to locate and mark the tumor area in your breast (localization). This will help guide your surgeon to where the incision will be made. This may be done with: Imaging, such as a mammogram, ultrasound, or MRI. Insertion of a small wire, clip, or seed, or an implant that  will reflect a radar signal. You may have screening tests or exams to get baseline measurements of your arm. These can be compared to measurements done after surgery to monitor for swelling (lymphedema) that can develop after having lymph nodes removed. Ask your health care provider: How your surgery site will be marked. What steps will be taken to help prevent infection. These may include: Washing skin with a germ-killing soap. Taking antibiotic medicine. Plan to have someone take you home from the hospital or clinic. Plan to have a responsible adult care for you for at least 24 hours after you leave the hospital or clinic. This is important. What happens during the procedure?  An IV will be inserted into one of your veins. You will be given one or more of the following: A medicine to help you relax (sedative). A medicine to numb the area (local anesthetic). A medicine to make you fall asleep (general anesthetic). Your health care provider will use a kind of electric scalpel that uses heat to reduce bleeding (electrocautery knife). A curved incision that follows the natural curve of your breast will  be made. This type of incision will allow for minimal scarring and better healing. The tumor will be removed along with some of the tissue around it. This will be sent to the lab for testing. Your health care provider may also remove lymph nodes at this time if needed. If the tumor is close to the muscles over your chest, some muscle tissue may also be removed. A small drain tube may be inserted into your breast area or armpit to collect fluid that may build up after surgery. This tube will be connected to a suction bulb on the outside of your body to remove the fluid. The incision will be closed with stitches (sutures). A bandage (dressing) may be placed over the incision. The procedure may vary among health care providers and hospitals. What happens after the procedure? Your blood pressure,  heart rate, breathing rate, and blood oxygen level will be monitored until you leave the hospital or clinic. You will be given medicine for pain as needed. Your IV will be removed when you are able to eat and drink by mouth. You will be encouraged to get up and walk as soon as you can. This is important to improve blood flow and breathing. Ask for help if you feel weak or unsteady. You may have: A drain tube in place for 2-3 days to prevent a collection of blood (hematoma) from developing in the breast. You will be given instructions about caring for the drain before you go home. A pressure bandage applied for 1-2 days to prevent bleeding or swelling. Your pressure bandage may look like a thick piece of fabric or an elastic wrap. Ask your health care provider how to care for your bandage at home. You may be given a tight sleeve to wear over your arm on the side of your surgery. You should wear this sleeve as told by your health care provider. Do not drive for 24 hours if you were given a sedative during your procedure. Summary A lumpectomy, sometimes called a partial mastectomy, is surgery to remove a cancerous tumor or mass (the lump) from a breast. During a lumpectomy, the portion of the breast that contains the tumor is removed. Lymph nodes under your arm may also be removed and tested to find out if the cancer has spread. Plan to have someone take you home from the hospital or clinic. You may have a drain tube in place for 2-3 days to prevent a collection of blood (hematoma) from developing in the breast. You will be given instructions about caring for the drain before you go home. This information is not intended to replace advice given to you by your health care provider. Make sure you discuss any questions you have with your healthcare provider. Document Revised: 12/31/2018 Document Reviewed: 12/31/2018 Elsevier Patient Education  [employer].

## 2021-01-21 NOTE — Progress Notes (Signed)
Patient ID: Amber Barnes, female   DOB: 01/16/1951, 69 y.o.   MRN: [id number]  Chief Complaint: Multifocal upper outer quadrant right breast cancer  History of Present Illness Amber Barnes is a 70 y.o. female with a screening mammogram revealing 2 lesions in the upper outer quadrant of the right breast.  She underwent ultrasound-guided and stereotactic biopsies both confirming a very similar cancer.  Prognostic indicators are still pending.  She utilized birth control about 4 years ago, had hysterectomy at age 38 for endometriosis.  Her only family history of breast cancer is a paternal aunt.  She began menses at the age of 89, had 1 pregnancy at the age of 54.  She denies any presence of a breast mass, skin changes.  There is some nonspontaneous nipple discharge on the right side.   Past Medical History Past Medical History:  Diagnosis Date   Achilles tendinitis    Acute medial meniscal tear    Allergy    Anxiety    Arthritis    Atrial fibrillation ([place])    a. new onset 07/2016; b. CHADS2VASc --> 2 (HTN, female); c. eliquis   CHF (congestive heart failure) (HCC)    Chronic lower back pain    Colon polyp    COPD (chronic obstructive pulmonary disease) (HCC)    Cystic breast    Depression    Dysrhythmia    ATRIAL FIB   Endometriosis    Fibrocystic breast disease    GERD (gastroesophageal reflux disease)    History of stress test    a. 07/2016 MV: EF 55-65%. Small, mild defect  in mid anteroseptal, apical septal, and apical locations. No ischemia-->low risk.   Hyperlipidemia    Hypertension    Insomnia    Lipoma    Migraine    Mild Mitral regurgitation    a. 07/2016 Echo: EF 55-65%, no rwma, mild MR. Mildly dil LA. Nl RV fx.    Osteoarthritis    left knee   Osteopenia    Peptic ulcer disease    a. H. pylori   Sleep apnea    does not use her cpap   Tendonitis    Thyroid disease    Vitamin D deficiency       Past Surgical History:  Procedure Laterality Date    ABDOMINAL HYSTERECTOMY     ovaries left   ACHILLES TENDON SURGERY     2012,01/2017   BACK SURGERY     BREAST BIOPSY Right 01/15/2021   affirm bx- "X" clip-path pending   CARDIOVERSION N/A 09/07/2016   Procedure: CARDIOVERSION;  Surgeon: Minna Merritts, MD;  Location: [facility];  Service: Cardiovascular;  Laterality: N/A;   CARDIOVERSION N/A 10/19/2016   Procedure: CARDIOVERSION;  Surgeon: Minna Merritts, MD;  Location: [facility];  Service: Cardiovascular;  Laterality: N/A;   CARDIOVERSION N/A 12/14/2016   Procedure: CARDIOVERSION;  Surgeon: Minna Merritts, MD;  Location: [facility];  Service: Cardiovascular;  Laterality: N/A;   COLONOSCOPY     COLONOSCOPY WITH PROPOFOL N/A 05/13/2020   Procedure: COLONOSCOPY WITH PROPOFOL;  Surgeon: Toledo, Benay Pike, MD;  Location: [facility];  Service: Gastroenterology;  Laterality: N/A;  ELIQUIS   ESOPHAGOGASTRODUODENOSCOPY     ESOPHAGOGASTRODUODENOSCOPY (EGD) WITH PROPOFOL N/A 05/13/2020   Procedure: ESOPHAGOGASTRODUODENOSCOPY (EGD) WITH PROPOFOL;  Surgeon: Toledo, Benay Pike, MD;  Location: [facility];  Service: Gastroenterology;  Laterality: N/A;   FOOT SURGERY     JOINT REPLACEMENT     THYROIDECTOMY     thyroid lobectomy  secondary to a benign nodule   TOTAL HIP ARTHROPLASTY Right 08/21/2018   Procedure: TOTAL HIP ARTHROPLASTY ANTERIOR APPROACH-RIGHT CELL SAVER REQUESTED;  Surgeon: [name], MD;  Location: [facility];  Service: Orthopedics;  Laterality: Right;    Allergies  Allergen Reactions   Prednisone Other (See Comments)    Agitation, hyperactivity, polyphagia   Amiodarone Nausea And Vomiting   Hydrocodone-Acetaminophen Other (See Comments)    Hallucinations and combative   Oxycodone Itching    Can take it with benadryl   Tapentadol Other (See Comments)   Tramadol Itching    Can take it with benadryl   Adhesive [Tape] Itching and Rash    Prefers paper tape   Latex Itching and Rash    use paper tap    Current  Outpatient Medications  Medication Sig Dispense Refill   acetaminophen (TYLENOL) 500 MG tablet Take 500 mg by mouth every 6 (six) hours as needed. Taking as needed     albuterol (VENTOLIN HFA) 108 (90 Base) MCG/ACT inhaler INHALE 2 PUFFS INTO THE LUNGS EVERY 8 (EIGHT) HOURS AS NEEDED FOR WHEEZING OR SHORTNESS OF BREATH. 6.7 g 0   ALPRAZolam (XANAX) 0.25 MG tablet TAKE 1 TABLET BY MOUTH DAILY AS NEEDED FOR ANXIETY OR SLEEP. 30 tablet 1   azelastine (ASTELIN) 0.1 % nasal spray Place into both nostrils as needed for rhinitis. Use in each nostril as directed     B Complex Vitamins (VITAMIN B COMPLEX PO) Take 1 tablet by mouth daily.      bisoprolol (ZEBETA) 5 MG tablet TAKE 1 TABLET BY MOUTH IN THE MORNING AND MAY TAKE AN EXTRA IN THE EVENING IF NEEDED FOR HEART RATE (Patient taking differently: Take 5 mg by mouth 2 (two) times daily.) 180 tablet 0   busPIRone (BUSPAR) 5 MG tablet TAKE 1 TABLET BY MOUTH THREE TIMES A DAY AS NEEDED 60 tablet 1   calcium gluconate 500 MG tablet Take 500 tablets by mouth daily.     calcium-vitamin D (OSCAL WITH D) 500-200 MG-UNIT tablet Take 1 tablet by mouth daily with breakfast.     Cholecalciferol (VITAMIN D3) 5000 units CAPS Take 5,000 Units by mouth daily.     DULoxetine (CYMBALTA) 30 MG capsule Take one 30 mg tablet by mouth once a day for the first week. Then increase to two 30 mg tablets ( total 39m) by mouth once daily. 60 capsule 3   ELIQUIS 5 MG TABS tablet TAKE 1 TABLET BY MOUTH TWICE A DAY 180 tablet 1   esomeprazole (NEXIUM) 20 MG capsule Take 20 mg by mouth as needed.     furosemide (LASIX) 20 MG tablet TAKE 1 TABLET BY MOUTH TWICE A DAY 180 tablet 0   hydrochlorothiazide (MICROZIDE) 12.5 MG capsule TAKE 1 CAPSULE BY MOUTH EVERY DAY 90 capsule 2   losartan (COZAAR) 100 MG tablet Take 1 tablet (100 mg total) by mouth daily. PLEASE CALL OFFICE TO SCHEDULE AN APPOINTMENT FOR FURTHER REFILLS 90 tablet 0   Multiple Vitamin (MULTIVITAMIN) tablet Take 1 tablet by  mouth daily.     potassium chloride (KLOR-CON) 10 MEQ tablet TAKE 1 TABLET TWICE DAILY. PLEASE MAKE APPT FOR FUTURE REFILLS. 180 tablet 0   ranolazine (RANEXA) 500 MG 12 hr tablet TAKE 1 TABLET BY MOUTH TWICE A DAY 180 tablet 3   rizatriptan (MAXALT) 10 MG tablet TAKE 1 TABLET (10 MG TOTAL) BY MOUTH AS NEEDED FOR MIGRAINE. MAY REPEAT IN 2 HOURS IF NEEDED 10 tablet 0   rOPINIRole (  REQUIP) 1 MG tablet Take 1 tablet (1 mg total) by mouth at bedtime. 90 tablet 1   rosuvastatin (CRESTOR) 20 MG tablet TAKE 1 TABLET BY MOUTH EVERY DAY 90 tablet 0   No current facility-administered medications for this visit.    Family History Family History  Problem Relation Age of Onset   Cancer Sister 81       ovarian   Depression Sister    Cancer Brother        CLL   Cancer Father 71       colon   Heart disease Father    Heart attack Father 42       died from MI   Hyperlipidemia Father    Hypertension Father    Cancer Mother        leukemia   Breast cancer Paternal Aunt        75's   Migraines Neg Hx       Social History Social History   Tobacco Use   Smoking status: Former    Packs/day: 0.25    Years: 10.00    Pack years: 2.50    Types: Cigarettes    Quit date: 2005    Years since quitting: 17.5   Smokeless tobacco: Never   Tobacco comments:    smoked for 10 years, 1 pack every 2-3 days  Vaping Use   Vaping Use: Never used  Substance Use Topics   Alcohol use: Not Currently    Comment: social, occasional   Drug use: No        Review of Systems  Constitutional:  Positive for diaphoresis and malaise/fatigue.  HENT: Negative.    Eyes: Negative.   Respiratory:  Positive for shortness of breath.   Cardiovascular:  Positive for orthopnea.  Gastrointestinal:  Positive for heartburn, nausea and vomiting.  Genitourinary: Negative.   Skin:  Positive for itching.  Neurological:  Positive for dizziness, tingling and headaches.  Psychiatric/Behavioral:  Positive for depression. The  patient is nervous/anxious.      Physical Exam Blood pressure 117/77, pulse 67, temperature 98.4 F (36.9 C), height _0  (1.651 m), weight 294 lb 3.2 oz (133.4 kg), SpO2 96 %. Last Weight  Most recent update: 01/21/2021  9:37 AM    Weight  133.4 kg (294 lb 3.2 oz)             CONSTITUTIONAL: Well developed, and nourished, appropriately responsive and aware without distress.  Morbidly obese. EYES: Sclera non-icteric.   EARS, NOSE, MOUTH AND THROAT: Mask worn.    Hearing is intact to voice.  NECK: Trachea is midline, and there is no jugular venous distension appreciable.  LYMPH NODES:  Lymph nodes in the neck are not appreciable. RESPIRATORY:  Lungs are clear, and breath sounds are equal bilaterally. Normal respiratory effort without pathologic use of accessory muscles. CARDIOVASCULAR: Heart is regular in rate and rhythm. GI: The abdomen is well rounded, soft, nontender, and nondistended. There were no palpable masses. I did not appreciate hepatosplenomegaly. There were normal bowel sounds. GU: Breast exam, resolving ecchymosis and biopsy sites upper outer quadrant right breast.  No remarkable suspicious skin changes, or palpable masses or nodularity.  Obese. MUSCULOSKELETAL:  Symmetrical muscle tone appreciated in all four extremities.    SKIN: Skin turgor is normal. No pathologic skin lesions appreciated.  NEUROLOGIC:  Motor and sensation appear grossly normal.  Cranial nerves are grossly without defect. PSYCH:  Alert and oriented to person, place and time. Affect is appropriate for  situation.  Data Reviewed I have personally reviewed what is currently available of the patient's imaging, recent labs and medical records.   Labs:  CBC Latest Ref Rng & Units 01/19/2021 03/31/2020 03/25/2020  WBC 4.0 - 10.5 K/uL 6.3 6.2 6.0  Hemoglobin 12.0 - 15.0 g/dL 14.3 14.2 14.0  Hematocrit 36.0 - 46.0 % 43.0 42.2 42.4  Platelets 150 - 400 K/uL 183 188 175.0   CMP Latest Ref Rng & Units 01/19/2021  09/22/2020 03/25/2020  Glucose 70 - 99 mg/dL 91 98 82  BUN 8 - 23 mg/dL _0 Creatinine 0.44 - 1.00 mg/dL 0.73 0.89 0.83  Sodium 135 - 145 mmol/L 138 141 138  Potassium 3.5 - 5.1 mmol/L 3.5 4.0 4.4  Chloride 98 - 111 mmol/L 98 100 100  CO2 22 - 32 mmol/L _1 Calcium 8.9 - 10.3 mg/dL 8.8(L) 9.2 9.0  Total Protein 6.5 - 8.1 g/dL 8.1 - 6.9  Total Bilirubin 0.3 - 1.2 mg/dL 0.8 - 0.7  Alkaline Phos 38 - 126 U/L 75 - 79  AST 15 - 41 U/L 21 - 20  ALT 0 - 44 U/L 17 - 22    SURGICAL PATHOLOGY  CASE: [id number]  PATIENT: Areliz Hodsdon  Surgical Pathology Report      Specimen Submitted:  A. Breast, right  B. Breast, right, UOQ,1.0 cm   Clinical History: X clip within or immediately adjacent to the 0.5 cm  mass in UOQ.  Venus clip within 1.0 cm mass UOQ.  Clips are separated by  approximately 1.7 cm.       DIAGNOSIS:  A. RIGHT BREAST; STEREOTACTIC BIOPSY:  - INVASIVE MAMMARY CARCINOMA, NO SPECIAL TYPE.   Size of invasive carcinoma: 5 mm in this sample  Histologic grade of invasive carcinoma: Grade 2                       Glandular/tubular differentiation score: 3                       Nuclear pleomorphism score: 2                       Mitotic rate score: 2                       Total score: 7  Ductal carcinoma in situ: Present  Lymphovascular invasion: Not identified   Comment:  The definitive grade will be assigned on the excisional specimen.  ER/PR/HER2 immunohistochemistry will be performed on block A1, with  reflex to [place] for HER2 2+. The results will be reported in an addendum.   B. RIGHT BREAST, UPPER OUTER QUADRANT 10:00, 9CMFN; ULTRASOUND-GUIDED  BIOPSY:  - INVASIVE MAMMARY CARCINOMA, AT LEAST 7 MM, SIMILAR TO THAT DESCRIBED  ABOVE (SEE PART A).    GROSS DESCRIPTION:  A. Labeled: Right breast stereo biopsy upper outer quadrant "small mass"  Received: in a formalin-filled [place] collection device  Specimen radiograph image(s) available for  review  Time/Date in fixative: Collected at 8:36 AM on 01/15/2021 and placed in  formalin 8:40 AM on 01/15/2021  Cold ischemic time: Less than 5 minutes  Total fixation time: Approximately 8.5 hours  Core pieces: Multiple  Measurement: Aggregate, 5 x 2.5 x 0.4 cm  Description / comments: Received are cores and fragments of yellow  fibrofatty tissue.  A distinct diagram indicating the location of  calcifications is not  received on the container or requisition.  Inked: Blue  Entirely submitted in cassette(s):   1 - sections A and B  2 - sections C and D  3 - sections E and F  4 - sections G and H  5 - sections I, J, K, L, and remaining free-floating fragments   B. Labeled: Right breast upper outer quadrant 10:00 9 cm from nipple  Received: Formalin  Time/date in fixative: Collected and placed in formalin at 9:18 AM on  01/15/2021  Cold ischemic time: Less than 1 minute  Total fixation time: Approximately 8 hours  Core pieces: 3 cores and 2 additional fragments  Size: Range from 1-1.5 cm in length and 0.2 cm in diameter  Description: Received are cores and fragments of yellow fibrofatty  tissue.  The additional fragments range from 0.2 to 0.3 cm in greatest  dimension.  Ink color: Green  Entirely submitted in cassettes 1-2 with 2 cores in cassette 1 and 1  core with the 2 remaining fragments in cassette 2.   RB 01/15/2021     Final Diagnosis performed by Betsy Pries, MD.   Electronically signed  01/18/2021 10:14:05AM    Imaging: Radiology review:  CLINICAL DATA:  Patient recalled from screening for right breast masses and asymmetry.   EXAM: DIGITAL DIAGNOSTIC UNILATERAL RIGHT MAMMOGRAM WITH TOMOSYNTHESIS AND CAD; ULTRASOUND RIGHT BREAST LIMITED   TECHNIQUE: Right digital diagnostic mammography and breast tomosynthesis was performed. The images were evaluated with computer-aided detection.; Targeted ultrasound examination of the right breast was performed   COMPARISON:   Previous exam(s).   ACR Breast Density Category b: There are scattered areas of fibroglandular density.   FINDINGS: Within the upper-outer right breast middle depth there are two adjacent irregular spiculated masses, the largest measuring 10 mm and the smaller more medial mass measuring 5 mm. Questioned asymmetry within the outer periareolar right breast resolved with additional imaging compatible with dense tissue. No additional concerning findings.   Targeted ultrasound is performed, showing a 10 x 10 x 7 mm irregular hypoechoic mass right breast 10 o'clock position 9 cm from nipple.   The additional mass within the upper-outer right breast seen on mammography is not able to be identified with ultrasound.   No right axillary adenopathy.   IMPRESSION: Suspicious right breast mass 10 o'clock position 9 cm from nipple identified on ultrasound.   There is an adjacent smaller satellite mass within the upper-outer right breast demonstrated on mammography but not well identified with ultrasound.   RECOMMENDATION: Ultrasound-guided core needle biopsy suspicious right breast mass 10 o'clock position 9 cm from nipple.   Stereotactic guided core needle biopsy smaller adjacent satellite nodule upper outer right breast. Consider doing the stereotactic guided core needle biopsy first so that the ultrasound biopsy of the larger mass does not obscure the smaller satellite mass.   I have discussed the findings and recommendations with the patient. If applicable, a reminder letter will be sent to the patient regarding the next appointment.   BI-RADS CATEGORY  5: Highly suggestive of malignancy.     Electronically Signed   By: Lovey Newcomer M.D.   On: 01/08/2021 12:07 CLINICAL DATA:  Confirmation of clip placement after stereotactic tomosynthesis core needle biopsy and ultrasound-guided core needle biopsy of 2 adjacent masses in the UPPER OUTER QUADRANT of the RIGHT breast.    EXAM: 2D and 3D DIAGNOSTIC RIGHT MAMMOGRAM POST ULTRASOUND AND STEREOTACTIC TOMOSYNTHESIS BIOPSY   COMPARISON:  Previous exam(s).   FINDINGS: Tomosynthesis and  synthesized full field CC and mediolateral images were obtained following stereotactic tomosynthesis guided biopsy and ultrasound guided biopsy of 2 adjacent masses in the [place] of the RIGHT breast.   The X shaped biopsy marking clip is appropriately position within or immediately adjacent to the biopsied 0.5 cm mass in the [place].   The Venus biopsy marking clip is appropriately position within the biopsied 1.0 cm mass in the [place].   The clips are separated by approximately 1.7 cm.   Expected post biopsy changes are present without evidence of hematoma.   IMPRESSION: 1. Appropriate positioning of the X shaped biopsy marking clip at the site of the biopsied 0.5 cm mass in the UPPER OUTER QUADRANT of the RIGHT breast. 2. Appropriate positioning of the Venus biopsy marking clip within the biopsied 1.0 cm mass in the UPPER OUTER QUADRANT of the RIGHT breast. 3. The clips are separated by approximately 1.7 cm.   Final Assessment: Post Procedure Mammograms for Marker Placement     Electronically Signed   By: Evangeline Dakin M.D.   On: 01/15/2021 09:36  Assessment    Multifocal right breast cancer, limited to upper outer quadrant.  Clinically node-negative.  Prognostic indicators pending. Patient Active Problem List   Diagnosis Date Noted   Breast cancer in female [facility]) 01/19/2021   Left arm pain 12/02/2020   Soft tissue mass 12/02/2020   Candidal intertrigo 09/09/2020   Urinary frequency 09/09/2020   Night sweat 03/31/2020   Nasal drainage 12/18/2019   Sinus congestion 12/18/2019   Nausea 10/30/2019   Major depression in remission ([place]) 08/27/2018   Primary osteoarthritis involving multiple joints 08/27/2018   Atrial fibrillation ([place]) 08/27/2018   COPD  (chronic obstructive pulmonary disease) ([place]) 08/27/2018   BMI 50.0-59.9, adult ([place]) 07/16/2018   Morbid obesity with BMI of 50.0-59.9, adult ([place]) 05/31/2018   Bradycardia 05/22/2017   Restless leg syndrome 04/13/2017   OSA (obstructive sleep apnea) 01/09/2017   Fatty liver disease, nonalcoholic 01/08/1600   Family history of ovarian cancer 06/08/2016   GERD (gastroesophageal reflux disease) 05/05/2016   Hypertension 11/11/2014   Anxiety and depression 11/11/2014   Weight loss 11/11/2014    Plan   I discussed the available options with the patient. The risk of recurrence is similar between mastectomy and lumpectomy with radiation.  I also discussed that given the size of the cancer would recommend localization lumpectomy with radiation to follow.  I also discussed that we would need to do a sentinel lymph node biopsy to check the nodes.   Explained to the patient that after her surgical treatment additional treatment will depend on her prognostic indicators and stage.    I discussed risks of bleeding, infection, damage to surrounding tissues, having positive margins, needing further resection, damage to nerves causing arm numbness or difficulty raising arm, causing lymphedema in the arm; as well as anesthesia risks of MI, stroke, prolonged ventilation, pulmonary embolism, thrombosis and even death.   Patient was given the opportunity to ask questions and have them answered.  They would like to proceed with right breast RFID localized lumpectomy with sentinel lymph node biopsy.    Face-to-face time spent with the patient and accompanying care providers(if present) was 45 minutes, with more than 50% of the time spent counseling, educating, and coordinating care of the patient.    These notes generated with voice recognition software. I apologize for typographical errors.  Ronny Bacon M.D., FACS 01/21/2021, 10:26 AM

## 2021-01-21 NOTE — H&P (View-Only) (Signed)
Patient ID: Amber Barnes, female   DOB: 01/16/1951, 69 y.o.   MRN: [id number]  Chief Complaint: Multifocal upper outer quadrant right breast cancer  History of Present Illness Amber Barnes is a 70 y.o. female with a screening mammogram revealing 2 lesions in the upper outer quadrant of the right breast.  She underwent ultrasound-guided and stereotactic biopsies both confirming a very similar cancer.  Prognostic indicators are still pending.  She utilized birth control about 4 years ago, had hysterectomy at age 38 for endometriosis.  Her only family history of breast cancer is a paternal aunt.  She began menses at the age of 89, had 1 pregnancy at the age of 54.  She denies any presence of a breast mass, skin changes.  There is some nonspontaneous nipple discharge on the right side.   Past Medical History Past Medical History:  Diagnosis Date   Achilles tendinitis    Acute medial meniscal tear    Allergy    Anxiety    Arthritis    Atrial fibrillation ([place])    a. new onset 07/2016; b. CHADS2VASc --> 2 (HTN, female); c. eliquis   CHF (congestive heart failure) (HCC)    Chronic lower back pain    Colon polyp    COPD (chronic obstructive pulmonary disease) (HCC)    Cystic breast    Depression    Dysrhythmia    ATRIAL FIB   Endometriosis    Fibrocystic breast disease    GERD (gastroesophageal reflux disease)    History of stress test    a. 07/2016 MV: EF 55-65%. Small, mild defect  in mid anteroseptal, apical septal, and apical locations. No ischemia-->low risk.   Hyperlipidemia    Hypertension    Insomnia    Lipoma    Migraine    Mild Mitral regurgitation    a. 07/2016 Echo: EF 55-65%, no rwma, mild MR. Mildly dil LA. Nl RV fx.    Osteoarthritis    left knee   Osteopenia    Peptic ulcer disease    a. H. pylori   Sleep apnea    does not use her cpap   Tendonitis    Thyroid disease    Vitamin D deficiency       Past Surgical History:  Procedure Laterality Date    ABDOMINAL HYSTERECTOMY     ovaries left   ACHILLES TENDON SURGERY     2012,01/2017   BACK SURGERY     BREAST BIOPSY Right 01/15/2021   affirm bx- "X" clip-path pending   CARDIOVERSION N/A 09/07/2016   Procedure: CARDIOVERSION;  Surgeon: Minna Merritts, MD;  Location: [facility];  Service: Cardiovascular;  Laterality: N/A;   CARDIOVERSION N/A 10/19/2016   Procedure: CARDIOVERSION;  Surgeon: Minna Merritts, MD;  Location: [facility];  Service: Cardiovascular;  Laterality: N/A;   CARDIOVERSION N/A 12/14/2016   Procedure: CARDIOVERSION;  Surgeon: Minna Merritts, MD;  Location: [facility];  Service: Cardiovascular;  Laterality: N/A;   COLONOSCOPY     COLONOSCOPY WITH PROPOFOL N/A 05/13/2020   Procedure: COLONOSCOPY WITH PROPOFOL;  Surgeon: Toledo, Benay Pike, MD;  Location: [facility];  Service: Gastroenterology;  Laterality: N/A;  ELIQUIS   ESOPHAGOGASTRODUODENOSCOPY     ESOPHAGOGASTRODUODENOSCOPY (EGD) WITH PROPOFOL N/A 05/13/2020   Procedure: ESOPHAGOGASTRODUODENOSCOPY (EGD) WITH PROPOFOL;  Surgeon: Toledo, Benay Pike, MD;  Location: [facility];  Service: Gastroenterology;  Laterality: N/A;   FOOT SURGERY     JOINT REPLACEMENT     THYROIDECTOMY     thyroid lobectomy  secondary to a benign nodule   TOTAL HIP ARTHROPLASTY Right 08/21/2018   Procedure: TOTAL HIP ARTHROPLASTY ANTERIOR APPROACH-RIGHT CELL SAVER REQUESTED;  Surgeon: [name], MD;  Location: [facility];  Service: Orthopedics;  Laterality: Right;    Allergies  Allergen Reactions   Prednisone Other (See Comments)    Agitation, hyperactivity, polyphagia   Amiodarone Nausea And Vomiting   Hydrocodone-Acetaminophen Other (See Comments)    Hallucinations and combative   Oxycodone Itching    Can take it with benadryl   Tapentadol Other (See Comments)   Tramadol Itching    Can take it with benadryl   Adhesive [Tape] Itching and Rash    Prefers paper tape   Latex Itching and Rash    use paper tap    Current  Outpatient Medications  Medication Sig Dispense Refill   acetaminophen (TYLENOL) 500 MG tablet Take 500 mg by mouth every 6 (six) hours as needed. Taking as needed     albuterol (VENTOLIN HFA) 108 (90 Base) MCG/ACT inhaler INHALE 2 PUFFS INTO THE LUNGS EVERY 8 (EIGHT) HOURS AS NEEDED FOR WHEEZING OR SHORTNESS OF BREATH. 6.7 g 0   ALPRAZolam (XANAX) 0.25 MG tablet TAKE 1 TABLET BY MOUTH DAILY AS NEEDED FOR ANXIETY OR SLEEP. 30 tablet 1   azelastine (ASTELIN) 0.1 % nasal spray Place into both nostrils as needed for rhinitis. Use in each nostril as directed     B Complex Vitamins (VITAMIN B COMPLEX PO) Take 1 tablet by mouth daily.      bisoprolol (ZEBETA) 5 MG tablet TAKE 1 TABLET BY MOUTH IN THE MORNING AND MAY TAKE AN EXTRA IN THE EVENING IF NEEDED FOR HEART RATE (Patient taking differently: Take 5 mg by mouth 2 (two) times daily.) 180 tablet 0   busPIRone (BUSPAR) 5 MG tablet TAKE 1 TABLET BY MOUTH THREE TIMES A DAY AS NEEDED 60 tablet 1   calcium gluconate 500 MG tablet Take 500 tablets by mouth daily.     calcium-vitamin D (OSCAL WITH D) 500-200 MG-UNIT tablet Take 1 tablet by mouth daily with breakfast.     Cholecalciferol (VITAMIN D3) 5000 units CAPS Take 5,000 Units by mouth daily.     DULoxetine (CYMBALTA) 30 MG capsule Take one 30 mg tablet by mouth once a day for the first week. Then increase to two 30 mg tablets ( total 39m) by mouth once daily. 60 capsule 3   ELIQUIS 5 MG TABS tablet TAKE 1 TABLET BY MOUTH TWICE A DAY 180 tablet 1   esomeprazole (NEXIUM) 20 MG capsule Take 20 mg by mouth as needed.     furosemide (LASIX) 20 MG tablet TAKE 1 TABLET BY MOUTH TWICE A DAY 180 tablet 0   hydrochlorothiazide (MICROZIDE) 12.5 MG capsule TAKE 1 CAPSULE BY MOUTH EVERY DAY 90 capsule 2   losartan (COZAAR) 100 MG tablet Take 1 tablet (100 mg total) by mouth daily. PLEASE CALL OFFICE TO SCHEDULE AN APPOINTMENT FOR FURTHER REFILLS 90 tablet 0   Multiple Vitamin (MULTIVITAMIN) tablet Take 1 tablet by  mouth daily.     potassium chloride (KLOR-CON) 10 MEQ tablet TAKE 1 TABLET TWICE DAILY. PLEASE MAKE APPT FOR FUTURE REFILLS. 180 tablet 0   ranolazine (RANEXA) 500 MG 12 hr tablet TAKE 1 TABLET BY MOUTH TWICE A DAY 180 tablet 3   rizatriptan (MAXALT) 10 MG tablet TAKE 1 TABLET (10 MG TOTAL) BY MOUTH AS NEEDED FOR MIGRAINE. MAY REPEAT IN 2 HOURS IF NEEDED 10 tablet 0   rOPINIRole (  REQUIP) 1 MG tablet Take 1 tablet (1 mg total) by mouth at bedtime. 90 tablet 1   rosuvastatin (CRESTOR) 20 MG tablet TAKE 1 TABLET BY MOUTH EVERY DAY 90 tablet 0   No current facility-administered medications for this visit.    Family History Family History  Problem Relation Age of Onset   Cancer Sister 81       ovarian   Depression Sister    Cancer Brother        CLL   Cancer Father 71       colon   Heart disease Father    Heart attack Father 42       died from MI   Hyperlipidemia Father    Hypertension Father    Cancer Mother        leukemia   Breast cancer Paternal Aunt        75's   Migraines Neg Hx       Social History Social History   Tobacco Use   Smoking status: Former    Packs/day: 0.25    Years: 10.00    Pack years: 2.50    Types: Cigarettes    Quit date: 2005    Years since quitting: 17.5   Smokeless tobacco: Never   Tobacco comments:    smoked for 10 years, 1 pack every 2-3 days  Vaping Use   Vaping Use: Never used  Substance Use Topics   Alcohol use: Not Currently    Comment: social, occasional   Drug use: No        Review of Systems  Constitutional:  Positive for diaphoresis and malaise/fatigue.  HENT: Negative.    Eyes: Negative.   Respiratory:  Positive for shortness of breath.   Cardiovascular:  Positive for orthopnea.  Gastrointestinal:  Positive for heartburn, nausea and vomiting.  Genitourinary: Negative.   Skin:  Positive for itching.  Neurological:  Positive for dizziness, tingling and headaches.  Psychiatric/Behavioral:  Positive for depression. The  patient is nervous/anxious.      Physical Exam Blood pressure 117/77, pulse 67, temperature 98.4 F (36.9 C), height _0  (1.651 m), weight 294 lb 3.2 oz (133.4 kg), SpO2 96 %. Last Weight  Most recent update: 01/21/2021  9:37 AM    Weight  133.4 kg (294 lb 3.2 oz)             CONSTITUTIONAL: Well developed, and nourished, appropriately responsive and aware without distress.  Morbidly obese. EYES: Sclera non-icteric.   EARS, NOSE, MOUTH AND THROAT: Mask worn.    Hearing is intact to voice.  NECK: Trachea is midline, and there is no jugular venous distension appreciable.  LYMPH NODES:  Lymph nodes in the neck are not appreciable. RESPIRATORY:  Lungs are clear, and breath sounds are equal bilaterally. Normal respiratory effort without pathologic use of accessory muscles. CARDIOVASCULAR: Heart is regular in rate and rhythm. GI: The abdomen is well rounded, soft, nontender, and nondistended. There were no palpable masses. I did not appreciate hepatosplenomegaly. There were normal bowel sounds. GU: Breast exam, resolving ecchymosis and biopsy sites upper outer quadrant right breast.  No remarkable suspicious skin changes, or palpable masses or nodularity.  Obese. MUSCULOSKELETAL:  Symmetrical muscle tone appreciated in all four extremities.    SKIN: Skin turgor is normal. No pathologic skin lesions appreciated.  NEUROLOGIC:  Motor and sensation appear grossly normal.  Cranial nerves are grossly without defect. PSYCH:  Alert and oriented to person, place and time. Affect is appropriate for  situation.  Data Reviewed I have personally reviewed what is currently available of the patient's imaging, recent labs and medical records.   Labs:  CBC Latest Ref Rng & Units 01/19/2021 03/31/2020 03/25/2020  WBC 4.0 - 10.5 K/uL 6.3 6.2 6.0  Hemoglobin 12.0 - 15.0 g/dL 14.3 14.2 14.0  Hematocrit 36.0 - 46.0 % 43.0 42.2 42.4  Platelets 150 - 400 K/uL 183 188 175.0   CMP Latest Ref Rng & Units 01/19/2021  09/22/2020 03/25/2020  Glucose 70 - 99 mg/dL 91 98 82  BUN 8 - 23 mg/dL _0 Creatinine 0.44 - 1.00 mg/dL 0.73 0.89 0.83  Sodium 135 - 145 mmol/L 138 141 138  Potassium 3.5 - 5.1 mmol/L 3.5 4.0 4.4  Chloride 98 - 111 mmol/L 98 100 100  CO2 22 - 32 mmol/L _1 Calcium 8.9 - 10.3 mg/dL 8.8(L) 9.2 9.0  Total Protein 6.5 - 8.1 g/dL 8.1 - 6.9  Total Bilirubin 0.3 - 1.2 mg/dL 0.8 - 0.7  Alkaline Phos 38 - 126 U/L 75 - 79  AST 15 - 41 U/L 21 - 20  ALT 0 - 44 U/L 17 - 22    SURGICAL PATHOLOGY  CASE: [id number]  PATIENT: Amber Barnes  Surgical Pathology Report      Specimen Submitted:  A. Breast, right  B. Breast, right, UOQ,1.0 cm   Clinical History: X clip within or immediately adjacent to the 0.5 cm  mass in UOQ.  Venus clip within 1.0 cm mass UOQ.  Clips are separated by  approximately 1.7 cm.       DIAGNOSIS:  A. RIGHT BREAST; STEREOTACTIC BIOPSY:  - INVASIVE MAMMARY CARCINOMA, NO SPECIAL TYPE.   Size of invasive carcinoma: 5 mm in this sample  Histologic grade of invasive carcinoma: Grade 2                       Glandular/tubular differentiation score: 3                       Nuclear pleomorphism score: 2                       Mitotic rate score: 2                       Total score: 7  Ductal carcinoma in situ: Present  Lymphovascular invasion: Not identified   Comment:  The definitive grade will be assigned on the excisional specimen.  ER/PR/HER2 immunohistochemistry will be performed on block A1, with  reflex to [place] for HER2 2+. The results will be reported in an addendum.   B. RIGHT BREAST, UPPER OUTER QUADRANT 10:00, 9CMFN; ULTRASOUND-GUIDED  BIOPSY:  - INVASIVE MAMMARY CARCINOMA, AT LEAST 7 MM, SIMILAR TO THAT DESCRIBED  ABOVE (SEE PART A).    GROSS DESCRIPTION:  A. Labeled: Right breast stereo biopsy upper outer quadrant "small mass"  Received: in a formalin-filled [place] collection device  Specimen radiograph image(s) available for  review  Time/Date in fixative: Collected at 8:36 AM on 01/15/2021 and placed in  formalin 8:40 AM on 01/15/2021  Cold ischemic time: Less than 5 minutes  Total fixation time: Approximately 8.5 hours  Core pieces: Multiple  Measurement: Aggregate, 5 x 2.5 x 0.4 cm  Description / comments: Received are cores and fragments of yellow  fibrofatty tissue.  A distinct diagram indicating the location of  calcifications is not  received on the container or requisition.  Inked: Blue  Entirely submitted in cassette(s):   1 - sections A and B  2 - sections C and D  3 - sections E and F  4 - sections G and H  5 - sections I, J, K, L, and remaining free-floating fragments   B. Labeled: Right breast upper outer quadrant 10:00 9 cm from nipple  Received: Formalin  Time/date in fixative: Collected and placed in formalin at 9:18 AM on  01/15/2021  Cold ischemic time: Less than 1 minute  Total fixation time: Approximately 8 hours  Core pieces: 3 cores and 2 additional fragments  Size: Range from 1-1.5 cm in length and 0.2 cm in diameter  Description: Received are cores and fragments of yellow fibrofatty  tissue.  The additional fragments range from 0.2 to 0.3 cm in greatest  dimension.  Ink color: Green  Entirely submitted in cassettes 1-2 with 2 cores in cassette 1 and 1  core with the 2 remaining fragments in cassette 2.   RB 01/15/2021     Final Diagnosis performed by Betsy Pries, MD.   Electronically signed  01/18/2021 10:14:05AM    Imaging: Radiology review:  CLINICAL DATA:  Patient recalled from screening for right breast masses and asymmetry.   EXAM: DIGITAL DIAGNOSTIC UNILATERAL RIGHT MAMMOGRAM WITH TOMOSYNTHESIS AND CAD; ULTRASOUND RIGHT BREAST LIMITED   TECHNIQUE: Right digital diagnostic mammography and breast tomosynthesis was performed. The images were evaluated with computer-aided detection.; Targeted ultrasound examination of the right breast was performed   COMPARISON:   Previous exam(s).   ACR Breast Density Category b: There are scattered areas of fibroglandular density.   FINDINGS: Within the upper-outer right breast middle depth there are two adjacent irregular spiculated masses, the largest measuring 10 mm and the smaller more medial mass measuring 5 mm. Questioned asymmetry within the outer periareolar right breast resolved with additional imaging compatible with dense tissue. No additional concerning findings.   Targeted ultrasound is performed, showing a 10 x 10 x 7 mm irregular hypoechoic mass right breast 10 o'clock position 9 cm from nipple.   The additional mass within the upper-outer right breast seen on mammography is not able to be identified with ultrasound.   No right axillary adenopathy.   IMPRESSION: Suspicious right breast mass 10 o'clock position 9 cm from nipple identified on ultrasound.   There is an adjacent smaller satellite mass within the upper-outer right breast demonstrated on mammography but not well identified with ultrasound.   RECOMMENDATION: Ultrasound-guided core needle biopsy suspicious right breast mass 10 o'clock position 9 cm from nipple.   Stereotactic guided core needle biopsy smaller adjacent satellite nodule upper outer right breast. Consider doing the stereotactic guided core needle biopsy first so that the ultrasound biopsy of the larger mass does not obscure the smaller satellite mass.   I have discussed the findings and recommendations with the patient. If applicable, a reminder letter will be sent to the patient regarding the next appointment.   BI-RADS CATEGORY  5: Highly suggestive of malignancy.     Electronically Signed   By: Lovey Newcomer M.D.   On: 01/08/2021 12:07 CLINICAL DATA:  Confirmation of clip placement after stereotactic tomosynthesis core needle biopsy and ultrasound-guided core needle biopsy of 2 adjacent masses in the UPPER OUTER QUADRANT of the RIGHT breast.    EXAM: 2D and 3D DIAGNOSTIC RIGHT MAMMOGRAM POST ULTRASOUND AND STEREOTACTIC TOMOSYNTHESIS BIOPSY   COMPARISON:  Previous exam(s).   FINDINGS: Tomosynthesis and  synthesized full field CC and mediolateral images were obtained following stereotactic tomosynthesis guided biopsy and ultrasound guided biopsy of 2 adjacent masses in the [place] of the RIGHT breast.   The X shaped biopsy marking clip is appropriately position within or immediately adjacent to the biopsied 0.5 cm mass in the [place].   The Venus biopsy marking clip is appropriately position within the biopsied 1.0 cm mass in the [place].   The clips are separated by approximately 1.7 cm.   Expected post biopsy changes are present without evidence of hematoma.   IMPRESSION: 1. Appropriate positioning of the X shaped biopsy marking clip at the site of the biopsied 0.5 cm mass in the UPPER OUTER QUADRANT of the RIGHT breast. 2. Appropriate positioning of the Venus biopsy marking clip within the biopsied 1.0 cm mass in the UPPER OUTER QUADRANT of the RIGHT breast. 3. The clips are separated by approximately 1.7 cm.   Final Assessment: Post Procedure Mammograms for Marker Placement     Electronically Signed   By: Evangeline Dakin M.D.   On: 01/15/2021 09:36  Assessment    Multifocal right breast cancer, limited to upper outer quadrant.  Clinically node-negative.  Prognostic indicators pending. Patient Active Problem List   Diagnosis Date Noted   Breast cancer in female [facility]) 01/19/2021   Left arm pain 12/02/2020   Soft tissue mass 12/02/2020   Candidal intertrigo 09/09/2020   Urinary frequency 09/09/2020   Night sweat 03/31/2020   Nasal drainage 12/18/2019   Sinus congestion 12/18/2019   Nausea 10/30/2019   Major depression in remission ([place]) 08/27/2018   Primary osteoarthritis involving multiple joints 08/27/2018   Atrial fibrillation ([place]) 08/27/2018   COPD  (chronic obstructive pulmonary disease) ([place]) 08/27/2018   BMI 50.0-59.9, adult ([place]) 07/16/2018   Morbid obesity with BMI of 50.0-59.9, adult ([place]) 05/31/2018   Bradycardia 05/22/2017   Restless leg syndrome 04/13/2017   OSA (obstructive sleep apnea) 01/09/2017   Fatty liver disease, nonalcoholic 01/08/1600   Family history of ovarian cancer 06/08/2016   GERD (gastroesophageal reflux disease) 05/05/2016   Hypertension 11/11/2014   Anxiety and depression 11/11/2014   Weight loss 11/11/2014    Plan   I discussed the available options with the patient. The risk of recurrence is similar between mastectomy and lumpectomy with radiation.  I also discussed that given the size of the cancer would recommend localization lumpectomy with radiation to follow.  I also discussed that we would need to do a sentinel lymph node biopsy to check the nodes.   Explained to the patient that after her surgical treatment additional treatment will depend on her prognostic indicators and stage.    I discussed risks of bleeding, infection, damage to surrounding tissues, having positive margins, needing further resection, damage to nerves causing arm numbness or difficulty raising arm, causing lymphedema in the arm; as well as anesthesia risks of MI, stroke, prolonged ventilation, pulmonary embolism, thrombosis and even death.   Patient was given the opportunity to ask questions and have them answered.  They would like to proceed with right breast RFID localized lumpectomy with sentinel lymph node biopsy.    Face-to-face time spent with the patient and accompanying care providers(if present) was 45 minutes, with more than 50% of the time spent counseling, educating, and coordinating care of the patient.    These notes generated with voice recognition software. I apologize for typographical errors.  Ronny Bacon M.D., FACS 01/21/2021, 10:26 AM

## 2021-01-25 ENCOUNTER — Other Ambulatory Visit: Payer: Self-pay | Admitting: Surgery

## 2021-01-25 DIAGNOSIS — C50411 Malignant neoplasm of upper-outer quadrant of right female breast: Secondary | ICD-10-CM

## 2021-01-25 IMAGING — US US ABDOMEN LIMITED
1 series · 14 of 25 positions shown · non-contrast
Comparison: None.

CLINICAL DATA: Right upper quadrant pain

EXAM:
ULTRASOUND ABDOMEN LIMITED RIGHT UPPER QUADRANT

[Series 1: us abdomen limited ruq · 14 of 46 slices shown]
[im 1/46]
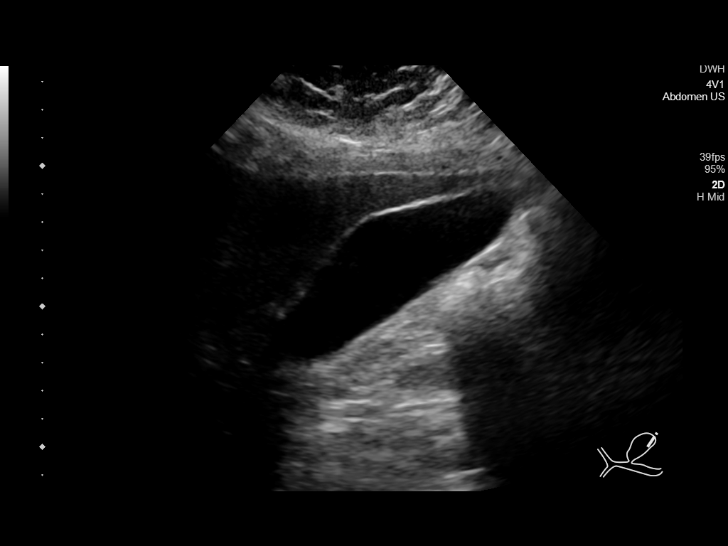
[im 4/46]
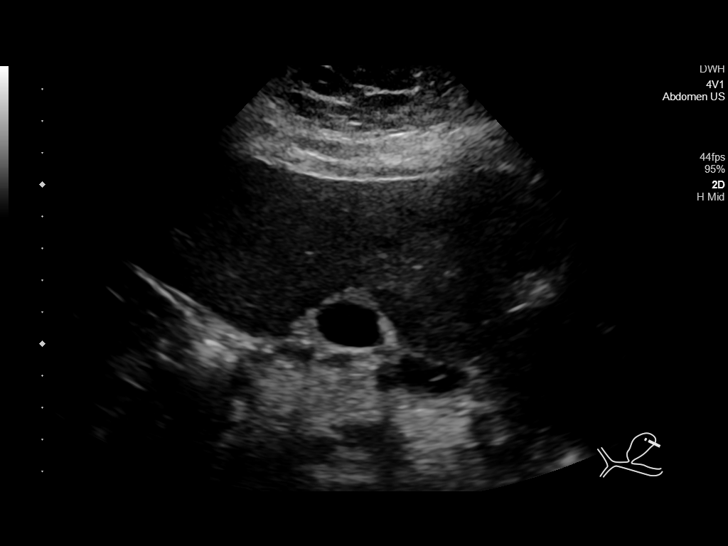
[im 8/46]
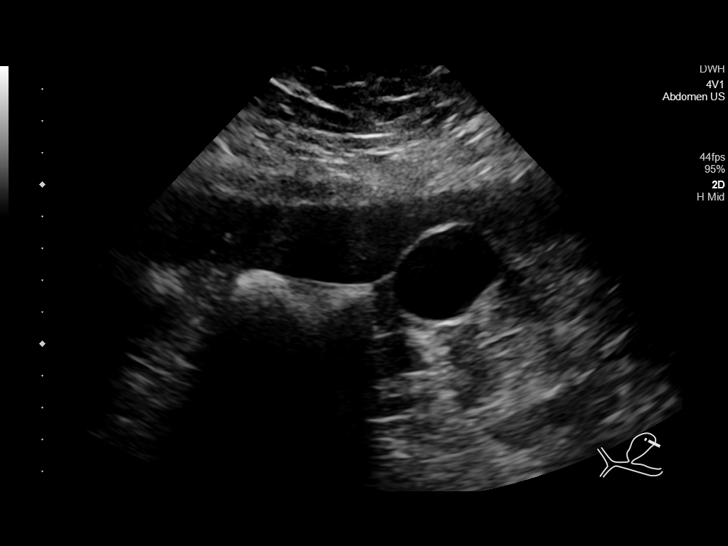
[im 12/46]
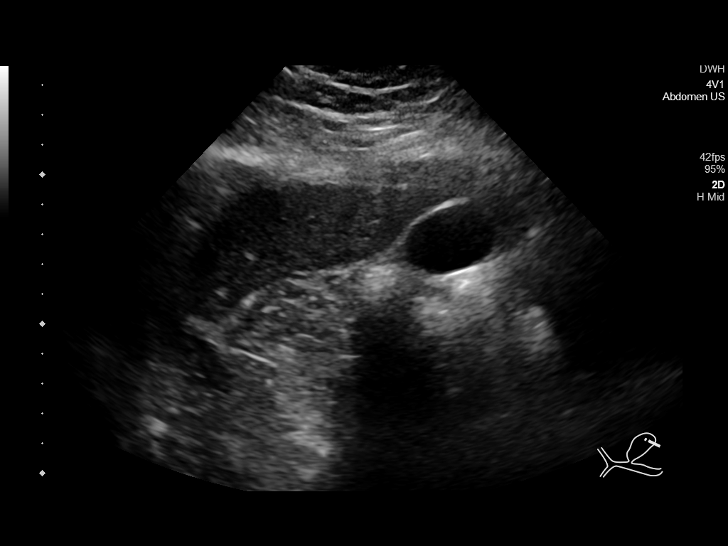
[im 16/46]
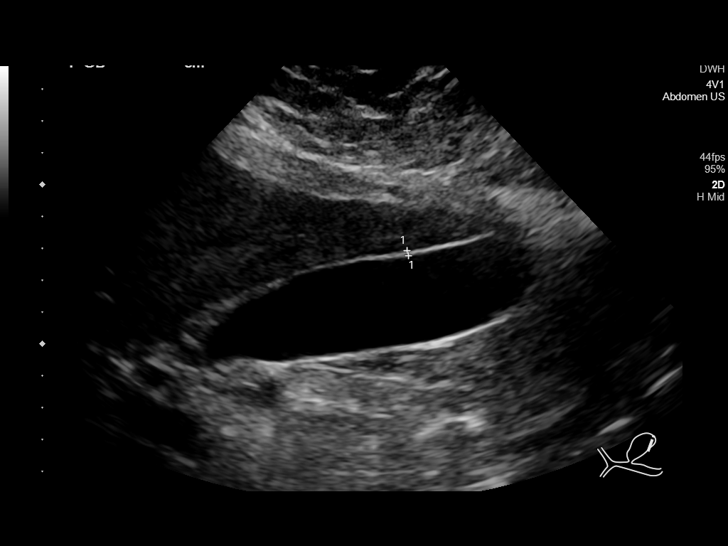
[im 17/46]
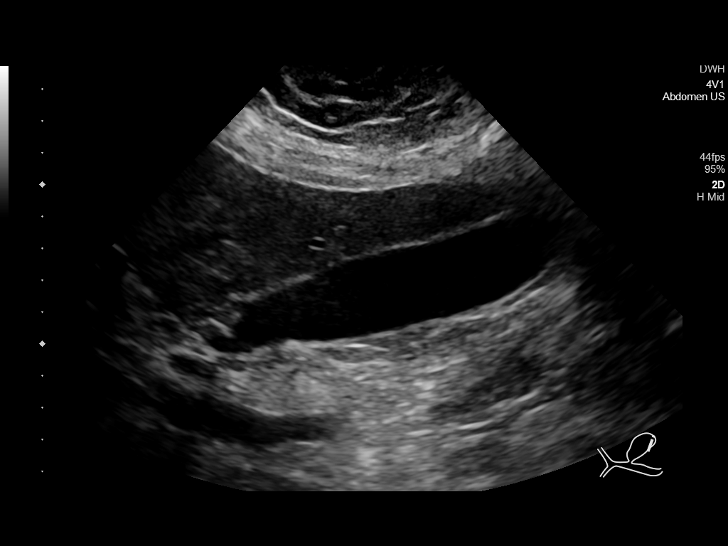
[im 21/46]
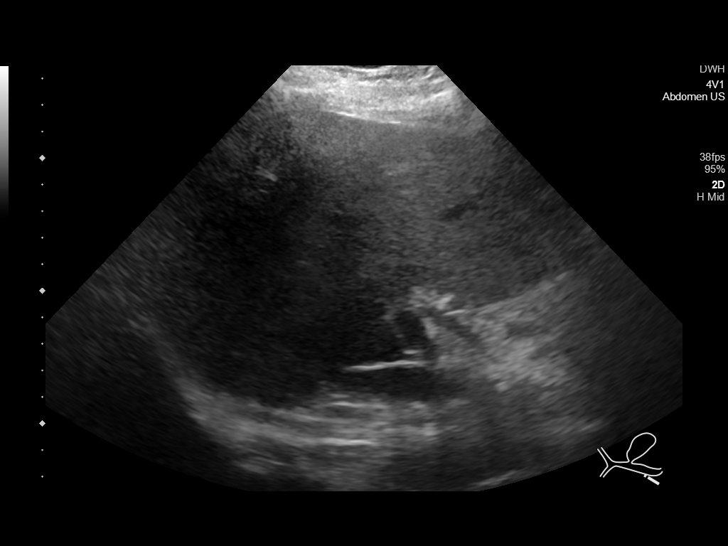
[im 25/46]
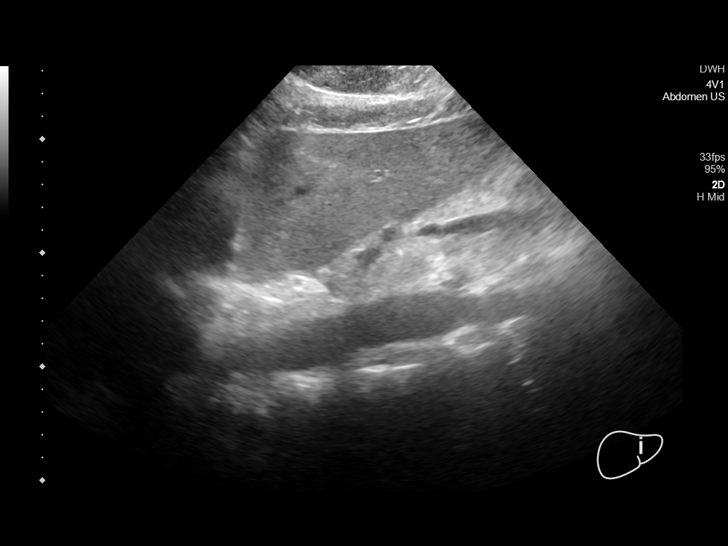
[im 29/46]
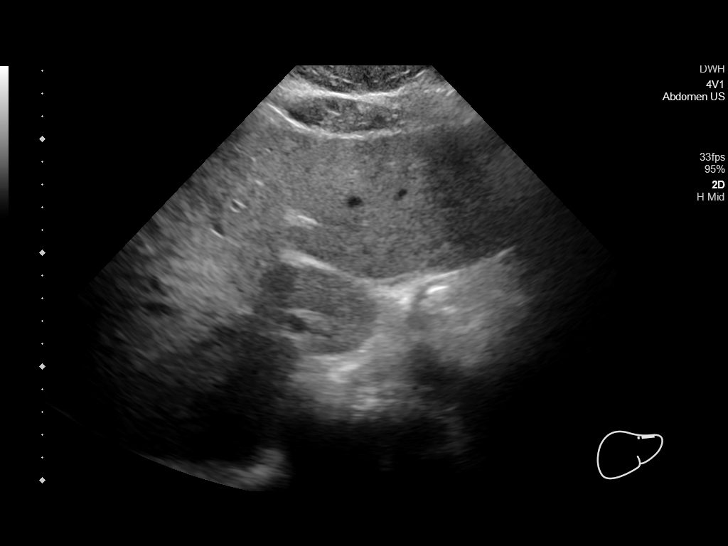
[im 31/46]
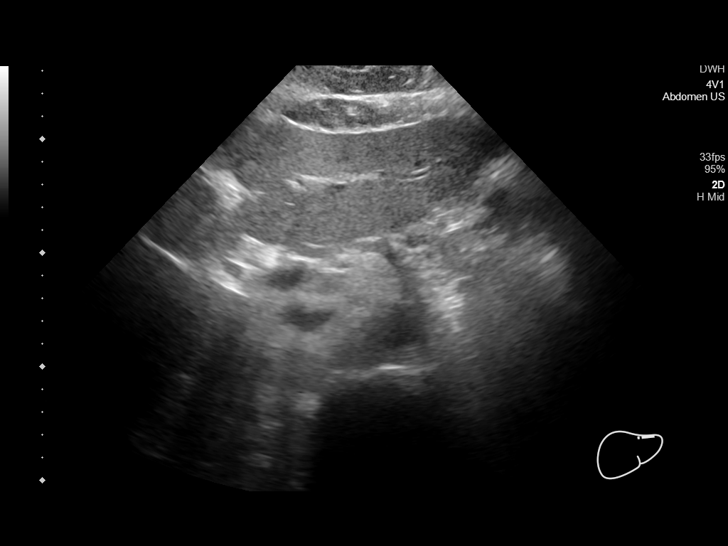
[im 34/46]
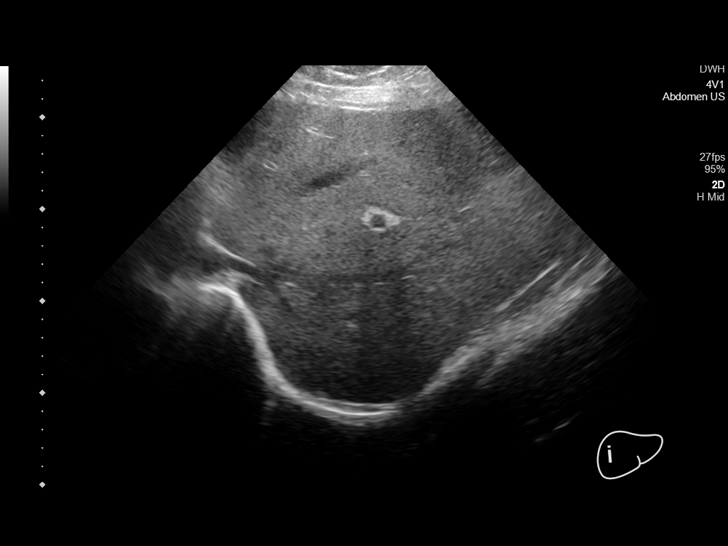
[im 38/46]
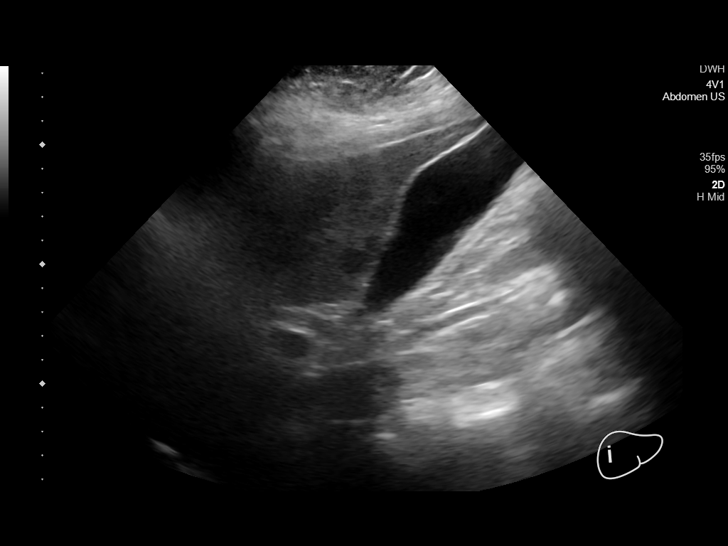
[im 42/46]
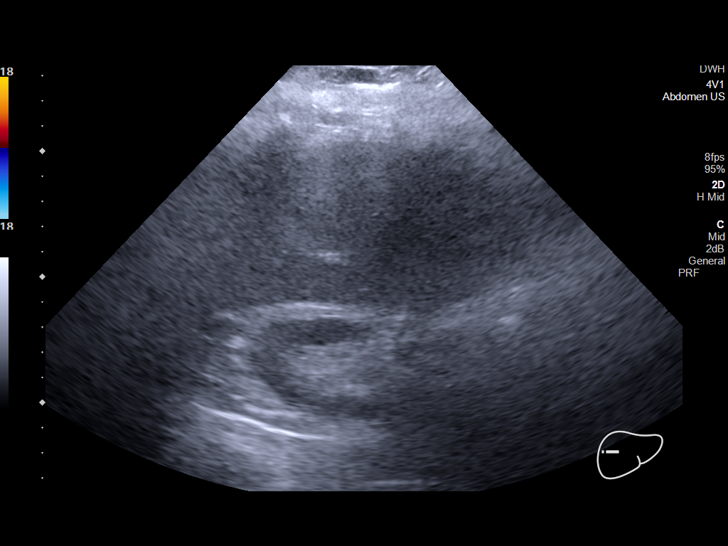
[im 46/46]
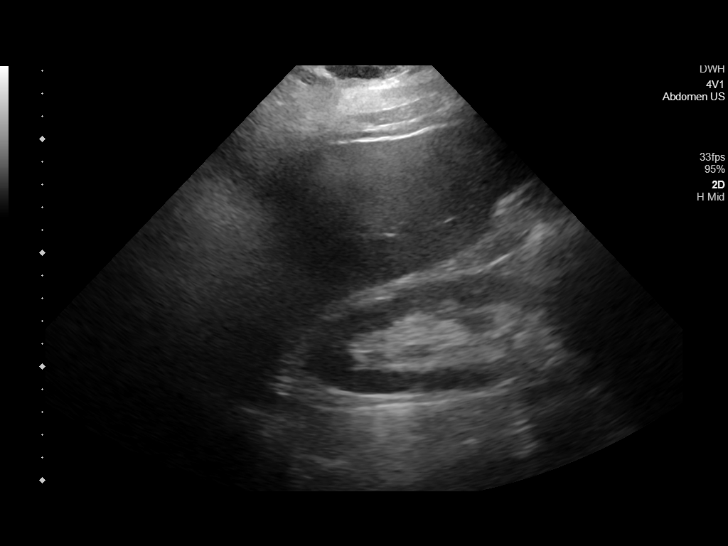

[14 of 25 positions shown; findings below may reference images not displayed]

FINDINGS: Gallbladder:

No gallstones or wall thickening visualized. No sonographic Murphy
sign noted by sonographer.

Common bile duct:

Diameter: Normal caliber, 5 mm

Liver:

Increased echotexture compatible with fatty infiltration. No focal
abnormality or biliary ductal dilatation. Portal vein is patent on
color Doppler imaging with normal direction of blood flow towards
the liver.

Other: None.
IMPRESSION: Fatty infiltration of the liver.  No acute findings.

## 2021-01-26 ENCOUNTER — Telehealth: Payer: Self-pay | Admitting: Surgery

## 2021-01-26 ENCOUNTER — Telehealth: Payer: Self-pay

## 2021-01-26 DIAGNOSIS — Z1731 Malignant neoplasm of unspecified site of unspecified female breast: Secondary | ICD-10-CM | POA: Insufficient documentation

## 2021-01-26 DIAGNOSIS — Z7189 Other specified counseling: Secondary | ICD-10-CM | POA: Insufficient documentation

## 2021-01-26 DIAGNOSIS — C50919 Malignant neoplasm of unspecified site of unspecified female breast: Secondary | ICD-10-CM | POA: Insufficient documentation

## 2021-01-26 NOTE — Telephone Encounter (Signed)
Patient has been advised of Pre-Admission date/time, COVID Testing date and Surgery date.  Surgery Date: 02/17/21 Preadmission Testing Date: 02/08/21 (phone 1p-5p) Covid Testing Date: Not needed.     Patient has been made aware to arrive at 8:00 am on 02/17/21 as she will be having SLN bx done prior to surgery with Dr. [name] same day.   Patient also reminded of her RF tags that will be placed at the [facility] on 02/03/21.   Patient verbalized understanding with the above.

## 2021-01-26 NOTE — Telephone Encounter (Signed)
Per Dr. Tasia Catchings [place]: Team, please call her and let her know that she has HER2 positive breast cancer, therefore she will eventually need adjuvant chemo after surgery. please arrange her to see me 2 weeks after her surgery.   Called and informed patient of plan and she voiced understanding. Pt reports that she is scheduled for surgery on 8/10.

## 2021-01-26 NOTE — Addendum Note (Signed)
Addended by: [name] on: 01/26/2021 03:53 PM   Modules accepted: Level of Service

## 2021-01-27 ENCOUNTER — Other Ambulatory Visit: Payer: Self-pay | Admitting: Family

## 2021-01-27 DIAGNOSIS — G2581 Restless legs syndrome: Secondary | ICD-10-CM

## 2021-01-27 DIAGNOSIS — F32A Depression, unspecified: Secondary | ICD-10-CM

## 2021-01-27 DIAGNOSIS — F419 Anxiety disorder, unspecified: Secondary | ICD-10-CM

## 2021-01-27 NOTE — Telephone Encounter (Signed)
[place], will you schedule this follow up please and notify pt of appts.

## 2021-01-27 NOTE — Telephone Encounter (Signed)
Left message informing patient of appointment on 8/24 at 1015.  Appointment reminder mailed.

## 2021-01-28 ENCOUNTER — Other Ambulatory Visit: Payer: Self-pay

## 2021-01-28 ENCOUNTER — Other Ambulatory Visit: Payer: Self-pay | Admitting: Family

## 2021-01-28 DIAGNOSIS — F419 Anxiety disorder, unspecified: Secondary | ICD-10-CM

## 2021-01-28 DIAGNOSIS — F32A Depression, unspecified: Secondary | ICD-10-CM

## 2021-01-28 MED ORDER — DULOXETINE HCL 30 MG PO CPEP: ORAL_CAPSULE | ORAL | 2 refills | Status: DC

## 2021-01-29 ENCOUNTER — Other Ambulatory Visit: Payer: Self-pay

## 2021-01-29 ENCOUNTER — Encounter: Payer: Self-pay | Admitting: Family

## 2021-01-29 ENCOUNTER — Ambulatory Visit: Payer: Medicare Other | Admitting: Nurse Practitioner

## 2021-02-03 ENCOUNTER — Other Ambulatory Visit: Payer: Self-pay | Admitting: Surgery

## 2021-02-03 ENCOUNTER — Ambulatory Visit
Admission: RE | Admit: 2021-02-03 | Discharge: 2021-02-03 | Disposition: A | Discharge status: Home / Self Care | Payer: Medicare Other | Source: Ambulatory Visit | Attending: Surgery | Admitting: Surgery

## 2021-02-03 ENCOUNTER — Other Ambulatory Visit: Payer: Self-pay

## 2021-02-03 DIAGNOSIS — C50411 Malignant neoplasm of upper-outer quadrant of right female breast: Secondary | ICD-10-CM

## 2021-02-03 DIAGNOSIS — C50911 Malignant neoplasm of unspecified site of right female breast: Secondary | ICD-10-CM | POA: Diagnosis not present

## 2021-02-08 ENCOUNTER — Other Ambulatory Visit: Payer: Self-pay

## 2021-02-08 ENCOUNTER — Encounter
Admission: RE | Admit: 2021-02-08 | Discharge: 2021-02-08 | Disposition: A | Discharge status: Home / Self Care | Payer: Medicare Other | Source: Ambulatory Visit | Attending: Surgery | Admitting: Surgery

## 2021-02-08 HISTORY — DX: Malignant neoplasm of unspecified site of unspecified female breast: C50.919

## 2021-02-08 HISTORY — DX: Other complications of anesthesia, initial encounter: T88.59XA

## 2021-02-08 HISTORY — DX: Pneumonia, unspecified organism: J18.9

## 2021-02-08 NOTE — Patient Instructions (Addendum)
Your procedure is scheduled on: Wednesday, August 10 Report to the Registration Desk on the 1st floor of the [facility] at 8 am and you will be directed to nuclear med. Dept.  When you are done in that department, you will be transported to the day surgery department.  REMEMBER: Instructions that are not followed completely may result in serious medical risk, up to and including death; or upon the discretion of your surgeon and anesthesiologist your surgery may need to be rescheduled.  Do not eat food after midnight the night before surgery.  No gum chewing, lozengers or hard candies.  You may however, drink CLEAR liquids up to 2 hours before you are scheduled to arrive for your surgery. Do not drink anything within 2 hours of your scheduled arrival time.  Clear liquids include: - water  - apple juice without pulp - gatorade (not RED, PURPLE, OR BLUE) - black coffee or tea (Do NOT add milk or creamers to the coffee or tea) Do NOT drink anything that is not on this list.  TAKE THESE MEDICATIONS THE MORNING OF SURGERY WITH A SIP OF WATER:  Albuterol inhaler Bisoprolol Duloxetine (Cymbalta) Esomeprazole (Nexium) Ranolazine (ranexa) Rosuvastatin (crestor)  Use inhalers on the day of surgery and bring to the hospital.  Follow recommendations from Cardiologist regarding stopping Eliquis. Per secure chat with Dr. [name], stop Eliquis 2 days prior to surgery. Last day to take Eliquis is Sunday, August 7. Resume AFTER surgery per surgeon instruction.  One week prior to surgery: starting August 3 Stop Anti-inflammatories (NSAIDS) such as Advil, Aleve, Ibuprofen, Motrin, Naproxen, Naprosyn and Aspirin based products such as Excedrin, Goodys Powder, BC Powder. Stop ANY OVER THE COUNTER supplements until after surgery. (Vitamin B, calcium, Vitamin D, multi-vitamin) You may however, continue to take Tylenol if needed for pain up until the day of surgery.  No Alcohol for 24 hours before or  after surgery.  On the morning of surgery brush your teeth with toothpaste and water, you may rinse your mouth with mouthwash if you wish. Do not swallow any toothpaste or mouthwash.  Do not wear jewelry, make-up, hairpins, clips or nail polish.  Do not wear lotions, powders, or perfumes.   Do not shave body from the neck down 48 hours prior to surgery just in case you cut yourself which could leave a site for infection.   Contact lenses, hearing aids and dentures may not be worn into surgery.  Do not bring valuables to the hospital. [facility] is not responsible for any missing/lost belongings or valuables.   Shower using antibacterial soap prior to coming to the hospital on the day of surgery.  Notify your doctor if there is any change in your medical condition (cold, fever, infection).  Wear comfortable clothing (specific to your surgery type) to the hospital.  After surgery, you can help prevent lung complications by doing breathing exercises.  Take deep breaths and cough every 1-2 hours. Your doctor may order a device called an Incentive Spirometer to help you take deep breaths.  If you are being discharged the day of surgery, you will not be allowed to drive home. You will need a responsible adult (18 years or older) to drive you home and stay with you that night.   If you are taking public transportation, you will need to have a responsible adult (18 years or older) with you. Please confirm with your physician that it is acceptable to use public transportation.   Please call the  Pre-admissions Testing Dept. at [phone] if you have any questions about these instructions.  Surgery Visitation Policy:  Patients undergoing a surgery or procedure may have one family member or support person with them as long as that person is not COVID-19 positive or experiencing its symptoms.  That person may remain in the waiting area during the procedure.

## 2021-02-11 ENCOUNTER — Telehealth: Payer: Self-pay | Admitting: *Deleted

## 2021-02-11 ENCOUNTER — Encounter: Payer: Self-pay | Admitting: Surgery

## 2021-02-11 NOTE — Telephone Encounter (Signed)
1. What type of surgery is being performed?  BREAST LUMPECTOMY,RADIO FREQ LOCALIZER,AXILLARY SENTINEL LYMPH NODE BIOPSY   2. When is this surgery scheduled?  02/17/2021     3. Are there any medications that need to be held prior to surgery?  Apixaban x 2 days   4. Practice name and name of physician performing surgery?  Performing surgeon: Dr. Ronny Bacon, MD  Requesting clearance: Honor Loh, FNP-C       5. Anesthesia type (none, local, MAC, general)? General   6. What is the office phone and fax number?    Phone: [phone]  Fax: [phone]

## 2021-02-11 NOTE — Progress Notes (Signed)
Perioperative Services  Pre-Admission/Anesthesia Testing Clinical Review  Date: 02/12/21  Patient Demographics:  Name: Amber Barnes DOB:   06-May-1951 MRN:   [id number]  Planned Surgical Procedure(s):    Case: [id number] Date/Time: 02/17/21 0937   Procedure: BREAST LUMPECTOMY,RADIO FREQ LOCALIZER,AXILLARY SENTINEL LYMPH NODE BIOPSY (Right)   Anesthesia type: General   Pre-op diagnosis: right breast cancer   Location: [place] [facility] / [place] [facility]   Surgeons: Ronny Bacon, MD   NOTE: Available PAT nursing documentation and vital signs have been reviewed. Clinical nursing staff has updated patient's PMH/PSHx, current medication list, and drug allergies/intolerances to ensure comprehensive history available to assist in medical decision making as it pertains to the aforementioned surgical procedure and anticipated anesthetic course. Extensive review of available clinical information performed. [place] PMH and PSHx updated with any diagnoses/procedures that  may have been inadvertently omitted during her intake with the pre-admission testing department's nursing staff.  Clinical Discussion:  Amber Barnes is a 70 y.o. female who is submitted for pre-surgical anesthesia review and clearance prior to her undergoing the above procedure. Patient is a Former Smoker (2.5 pack years; quit 07/2003). Pertinent PMH includes: atrial fibrillation, CHF, mild MV regurgitation, HTN, HLD, COPD, GERD (no daily Tx), PUD, breast cancer, OA, depression, anxiety (on BZO), insomnia.   Patient is followed by cardiology [name], MD). She was last seen in the cardiology clinic on 12/16/2020; notes reviewed.  At the time of her clinic visit, patient reporting chronic dyspnea in the setting of a sedentary lifestyle secondary to orthopedic pain.  Patient denied any episodes of chest pain, PND, orthopnea, significant peripheral edema, vertiginous symptoms, or presyncope/syncope.  PMH significant  for cardiovascular diagnoses.  Patient diagnosed with atrial fibrillation in 07/2016.    TTE performed on 07/27/2016 revealed normal left ventricular systolic function with EF of 55 to 60%.  There was mild mitral valve regurgitation.  Left atrium was mildly dilated.    Subsequent myocardial perfusion imaging study performed on 07/29/2016 demonstrated a normal EF of 55 to 65% and a small mild perfusion abnormality with no evidence of significant ischemia.  Study felt to be suboptimal due to obesity and breast attenuation.  Patient underwent DCCV procedure on 09/07/2016 restoring NSR.  Due to recurrent atrial arrhythmia, patient underwent a second DCCV procedure on 10/19/2016 (see full interpretation of cardiovascular testing below).  CHA2DS2-VASc Score = 4 (age, sex, CHF, HTN). Patient chronically anticoagulated using apixaban; compliant with therapy with no evidence of GI bleeding.  Rate and rhythm controlled with beta-blocker monotherapy.  Patient on GDMT for HTN and HLD diagnoses.  Blood pressure well controlled at 108/70 on currently prescribed beta-blocker, diuretic, and ARB therapies.  Patient is on a statin for HLD. Functional capacity, as defined by DASI, is documented as being >/= 4 METS.  No changes were made to patient's medication regimen.  Patient to follow-up with outpatient cardiology in 6 months or sooner if needed.  Patient underwent breast biopsy in 01/15/2021 that revealed malignancy; stage IA invasive mammary carcinoma that is ER (-), PR (+), HER2/neu (+). She is under the car of Dr. [name], MD at the [facility]. Plans are for surgical resection followed by  adjuvant chemotherapy/endocrine therapy. Patient is scheduled for a lumpectomy on 02/17/2021 with Dr. Ronny Bacon, MD.  Given patient's past medical history significant for cardiovascular diagnoses, presurgical cardiac clearance was sought by the PAT team. Per cardiology, "based on patient's  past medical history and time since  his last clinic visit, patient would be at an overall ACCEPTABLE risk for the planned procedure without further cardiovascular testing or intervention at this time". Again, this patient is on daily anticoagulation therapy. She has been instructed on recommendations for holding her apixaban for 2 days prior to her procedure with plans to restart as soon as postoperative bleeding risk felt to be minimized by her attending surgeon. The patient has been instructed that her last dose of her anticoagulant will be on 02/14/2021.  Patient reports previous perioperative complications with anesthesia in the past. PMH (+) for hypoxia and emergence delirium with general anesthesia. In review of the available records, it is noted that patient underwent a general anesthetic course here  (ASA III) in 05/2020 without documented complications.   Vitals with BMI 02/08/2021 01/21/2021 01/19/2021  Height 5' 5"  5' 5"  -  Weight 291 lbs 294 lbs 3 oz 295 lbs 10 oz  BMI 48.43 38.18 29.93  Systolic - 716 967  Diastolic - 77 64  Pulse - 67 55  Some encounter information is confidential and restricted. Go to Review Flowsheets activity to see all data.   Providers/Specialists:   NOTE: Primary physician provider listed below. Patient may have been seen by APP or partner within same practice.   PROVIDER ROLE / SPECIALTY LAST Katy Apo, MD General Surgery 01/21/2021  [name], FNP Primary Care Provider 01/06/2021  Ida Rogue, MD Cardiology 12/16/2020  [name], MD Oncology 01/19/2021   Allergies:  Prednisone, Amiodarone, Hydrocodone-acetaminophen, Oxycodone, Tapentadol, Tramadol, Adhesive [tape], and Latex  Current Home Medications:   No current facility-administered medications for this encounter.    acetaminophen (TYLENOL) 500 MG tablet   albuterol (VENTOLIN HFA) 108 (90 Base) MCG/ACT inhaler   ALPRAZolam (XANAX) 0.25 MG tablet   azelastine (ASTELIN) 0.1 %  nasal spray   B Complex Vitamins (VITAMIN B COMPLEX PO)   bisoprolol (ZEBETA) 5 MG tablet   busPIRone (BUSPAR) 5 MG tablet   calcium gluconate 500 MG tablet   calcium-vitamin D (OSCAL WITH D) 500-200 MG-UNIT tablet   Cholecalciferol (VITAMIN D3) 5000 units CAPS   DULoxetine (CYMBALTA) 30 MG capsule   ELIQUIS 5 MG TABS tablet   esomeprazole (NEXIUM) 20 MG capsule   furosemide (LASIX) 20 MG tablet   hydrochlorothiazide (MICROZIDE) 12.5 MG capsule   losartan (COZAAR) 100 MG tablet   Multiple Vitamin (MULTIVITAMIN) tablet   potassium chloride (KLOR-CON) 10 MEQ tablet   ranolazine (RANEXA) 500 MG 12 hr tablet   rizatriptan (MAXALT) 10 MG tablet   rOPINIRole (REQUIP) 1 MG tablet   rosuvastatin (CRESTOR) 20 MG tablet   History:   Past Medical History:  Diagnosis Date   Achilles tendinitis    Acute medial meniscal tear    Allergy    Anxiety    Atrial fibrillation (HCC)    a. new onset 07/2016; b. CHADS2VASc --> 4 (HTN, female, age, CHF); c. eliquis   CHF (congestive heart failure) (HCC)    Chronic anticoagulation    Apixaban   Chronic lower back pain    Colon polyp    Complication of anesthesia    Emergence delirium; hypoxia   COPD (chronic obstructive pulmonary disease) (HCC)    Cystic breast    Depression    Endometriosis    Fibrocystic breast disease    GERD (gastroesophageal reflux disease)    History of stress test    a. 07/2016 MV: EF 55-65%. Small, mild defect  in mid anteroseptal, apical septal, and apical  locations. No ischemia-->low risk.   Hyperlipidemia    Hypertension    Insomnia    Lipoma    Malignant neoplasm of upper-outer quadrant of right female breast (HCC)    Stage 1A invasive mammary carcinoma (cT1b or T1c N0); ER (-), PR (+), HER2/neu (+)   Migraine    Mild Mitral regurgitation    a. 07/2016 Echo: EF 55-65%, no rwma, mild MR. Mildly dil LA. Nl RV fx.    OSA (obstructive sleep apnea)    non-compliant with nocturnal PAP therapy   Osteoarthritis     left knee   Osteopenia    Peptic ulcer disease    a. H. pylori   Pneumonia    RLS (restless legs syndrome)    Tendonitis    Thyroid disease    Vitamin D deficiency    Past Surgical History:  Procedure Laterality Date   ABDOMINAL HYSTERECTOMY     ovaries left   ACHILLES TENDON SURGERY     2012,01/2017   BACK SURGERY     fusion lumbar   BREAST BIOPSY Right 01/15/2021   Affirm biopsy-"X" clip-INVASIVE MAMMARY CARCINOMA   BREAST BIOPSY Right 01/15/2021   u/s bx-"venus" clip INVASIVE MAMMARY CARCINOMA   BREAST BIOPSY     CARDIOVERSION N/A 09/07/2016   Procedure: CARDIOVERSION;  Surgeon: Minna Merritts, MD;  Location: [facility] ORS;  Service: Cardiovascular;  Laterality: N/A;   CARDIOVERSION N/A 10/19/2016   Procedure: CARDIOVERSION;  Surgeon: Minna Merritts, MD;  Location: [facility]S;  Service: Cardiovascular;  Laterality: N/A;   CARDIOVERSION N/A 12/14/2016   Procedure: CARDIOVERSION;  Surgeon: Minna Merritts, MD;  Location: [facility] ORS;  Service: Cardiovascular;  Laterality: N/A;   COLONOSCOPY     COLONOSCOPY WITH PROPOFOL N/A 05/13/2020   Procedure: COLONOSCOPY WITH PROPOFOL;  Surgeon: Toledo, Benay Pike, MD;  Location: [facility];  Service: Gastroenterology;  Laterality: N/A;  ELIQUIS   ESOPHAGOGASTRODUODENOSCOPY     ESOPHAGOGASTRODUODENOSCOPY (EGD) WITH PROPOFOL N/A 05/13/2020   Procedure: ESOPHAGOGASTRODUODENOSCOPY (EGD) WITH PROPOFOL;  Surgeon: Toledo, Benay Pike, MD;  Location: [facility];  Service: Gastroenterology;  Laterality: N/A;   FOOT SURGERY Left    tendon   JOINT REPLACEMENT     THYROIDECTOMY     thyroid lobectomy secondary to a benign nodule   TOTAL HIP ARTHROPLASTY Right 08/21/2018   Procedure: TOTAL HIP ARTHROPLASTY ANTERIOR APPROACH-RIGHT CELL SAVER REQUESTED;  Surgeon: [name], MD;  Location: [facility] ORS;  Service: Orthopedics;  Laterality: Right;   Family History  Problem Relation Age of Onset   Cancer Sister 84       ovarian   Depression Sister     Cancer Brother        CLL   Cancer Father 48       colon   Heart disease Father    Heart attack Father 74       died from MI   Hyperlipidemia Father    Hypertension Father    Cancer Mother        leukemia   Breast cancer Paternal Aunt        37's   Migraines Neg Hx    Social History   Tobacco Use   Smoking status: Former    Packs/day: 0.25    Years: 10.00    Pack years: 2.50    Types: Cigarettes    Quit date: 2005    Years since quitting: 17.6   Smokeless tobacco: Never   Tobacco comments:    smoked for 10  years, 1 pack every 2-3 days  Vaping Use   Vaping Use: Never used  Substance Use Topics   Alcohol use: Not Currently    Comment: rarely   Drug use: No    Pertinent Clinical Results:  LABS: Labs reviewed: Acceptable for surgery.  Lab Results  Component Value Date   WBC 6.3 01/19/2021   HGB 14.3 01/19/2021   HCT 43.0 01/19/2021   MCV 90.9 01/19/2021   PLT 183 01/19/2021   Lab Results  Component Value Date   NA 138 01/19/2021   K 3.5 01/19/2021   CO2 29 01/19/2021   GLUCOSE 91 01/19/2021   BUN 14 01/19/2021   CREATININE 0.73 01/19/2021   CALCIUM 8.8 (L) 01/19/2021   GFRNONAA >60 01/19/2021   GFRAA >60 11/13/2019    ECG: Date: 12/16/2020 Time ECG obtained: 1000 AM Rate: 52 bpm Rhythm:  Sinus bradycardia; LVH Axis (leads I and aVF): Normal Intervals: PR 184 ms. QRS 94 ms. QTc 433 ms. ST segment and T wave changes: Evidence of an age undetermined anterolateral infarct present Comparison: Similar to previous tracing obtained on 05/20/2020   IMAGING / PROCEDURES: LEXISCAN performed on 07/29/2016 Left ventricular systolic function normal with EF of 55-65% Small defect of mild severity present in the mid anteroseptal, apical septal, and apex location. Possible small anteroseptal/apical infarct with no ischemia Suboptimal study due to obesity and breast attenuation Low risk study  TRANSTHORACIC ECHOCARDIOGRAM performed on 07/27/2016 LVEF 55 to  60% Low ventricular systolic function normal Left ventricular cavity size normal Mild concentric LVH No regional wall motion abnormalities Mild mitral valve regurgitation Mild left atrial dilatation Right ventricular systolic function normal PASP could not be accurately estimated No evidence of pericardial effusion  Impression and Plan:  Amber Barnes has been referred for pre-anesthesia review and clearance prior to her undergoing the planned anesthetic and procedural courses. Available labs, pertinent testing, and imaging results were personally reviewed by me. This patient has been appropriately cleared by cardiology with an overall ACCEPTABLE risk of significant perioperative cardiovascular complications.  Based on clinical review performed today (02/12/21), barring any significant acute changes in the patient's overall condition, it is anticipated that she will be able to proceed with the planned surgical intervention. Any acute changes in clinical condition may necessitate her procedure being postponed and/or cancelled. Patient will meet with anesthesia team (MD and/or CRNA) on the day of her procedure for preoperative evaluation/assessment. Questions regarding anesthetic course will be fielded at that time.   Pre-surgical instructions were reviewed with the patient during her PAT appointment and questions were fielded by PAT clinical staff. Patient was advised that if any questions or concerns arise prior to her procedure then she should return a call to PAT and/or her surgeon's office to discuss.  Honor Loh, MSN, APRN, FNP-C, CEN [facility]  Peri-operative Services Nurse Practitioner Phone: [phone] Fax: [phone] 02/12/21 7:51 AM  NOTE: This note has been prepared using Dragon dictation software. Despite my best ability to proofread, there is always the potential that unintentional transcriptional errors may still occur from this process.

## 2021-02-11 NOTE — Telephone Encounter (Signed)
-----   Message from Karen Kitchens, NP sent at 02/11/2021  1:13 PM EDT ----- Regarding: Request for pre-operative cardiac clearance Request for pre-operative cardiac clearance:  1. What type of surgery is being performed?  BREAST LUMPECTOMY,RADIO FREQ LOCALIZER,AXILLARY SENTINEL LYMPH NODE BIOPSY  2. When is this surgery scheduled?  02/17/2021  3. Are there any medications that need to be held prior to surgery? Apixaban x 2 days   4. Practice name and name of physician performing surgery?  Performing surgeon: Dr. Ronny Bacon, MD Requesting clearance: Honor Loh, FNP-C    5. Anesthesia type (none, local, MAC, general)? General   6. What is the office phone and fax number?   Phone: [phone] Fax: [phone]  ATTENTION: Unable to create telephone message as per your standard workflow. Directed by [facility] providers to send requests for cardiac clearance to this pool for appropriate distribution to provider covering pre-operative clearances.   Honor Loh, MSN, APRN, FNP-C, CEN [facility]  Peri-operative Services Nurse Practitioner Phone: [phone] 02/11/21 1:13 PM

## 2021-02-11 NOTE — Telephone Encounter (Signed)
Patient with diagnosis of afib on Eliquis for anticoagulation.    Procedure: BREAST [place] LYMPH NODE BIOPSY Date of procedure: 02/17/21  CHA2DS2-VASc Score = 3  This indicates a 3.2% annual risk of stroke. The patient's score is based upon: CHF History: No HTN History: Yes Diabetes History: No Stroke History: No Vascular Disease History: No Age Score: 1 Gender Score: 1    CrCl 101 ml/min  Per office protocol, patient can hold Eliquis for 2 days prior to procedure.

## 2021-02-12 NOTE — Telephone Encounter (Signed)
    Patient Name: Amber Barnes  DOB: 01-Apr-1951 MRN: [id number]  Primary Cardiologist: Ida Rogue, MD  Chart reviewed as part of pre-operative protocol coverage. Given past medical history and time since last visit, based on ACC/AHA guidelines, Amber Barnes would be at acceptable risk for the planned procedure without further cardiovascular testing.   Patient with diagnosis of afib on Eliquis for anticoagulation.     Procedure: BREAST [place] LYMPH NODE BIOPSY Date of procedure: 02/17/21   CHA2DS2-VASc Score = 3  This indicates a 3.2% annual risk of stroke. The patient's score is based upon: CHF History: No HTN History: Yes Diabetes History: No Stroke History: No Vascular Disease History: No Age Score: 1 Gender Score: 1    CrCl 101 ml/min   Per office protocol, patient can hold Eliquis for 2 days prior to procedure.     The patient was advised that if she develops new symptoms prior to surgery to contact our office to arrange for a follow-up visit, and she verbalized understanding.  I will route this recommendation to the requesting party via Epic fax function and remove from pre-op pool.  Please call with questions.  Kathyrn Drown, NP 02/12/2021, 7:41 AM

## 2021-02-14 ENCOUNTER — Other Ambulatory Visit: Payer: Self-pay | Admitting: Cardiovascular Disease

## 2021-02-16 DIAGNOSIS — C50811 Malignant neoplasm of overlapping sites of right female breast: Secondary | ICD-10-CM | POA: Diagnosis not present

## 2021-02-17 ENCOUNTER — Ambulatory Visit: Payer: Medicare Other | Admitting: Urgent Care

## 2021-02-17 ENCOUNTER — Encounter: Payer: Self-pay | Admitting: Surgery

## 2021-02-17 ENCOUNTER — Ambulatory Visit
Admission: RE | Admit: 2021-02-17 | Discharge: 2021-02-17 | Disposition: A | Discharge status: Home / Self Care | Payer: Medicare Other | Source: Ambulatory Visit | Attending: Surgery | Admitting: Surgery

## 2021-02-17 ENCOUNTER — Other Ambulatory Visit: Payer: Self-pay

## 2021-02-17 ENCOUNTER — Encounter
Admission: RE | Disposition: A | Discharge status: Home / Self Care | Payer: Self-pay | Source: Home / Self Care | Attending: Surgery

## 2021-02-17 ENCOUNTER — Ambulatory Visit
Admission: RE | Admit: 2021-02-17 | Discharge: 2021-02-17 | Disposition: A | Discharge status: Home / Self Care | Payer: Medicare Other | Attending: Surgery | Admitting: Surgery

## 2021-02-17 ENCOUNTER — Encounter: Payer: Self-pay | Admitting: Family

## 2021-02-17 DIAGNOSIS — Z91048 Other nonmedicinal substance allergy status: Secondary | ICD-10-CM | POA: Insufficient documentation

## 2021-02-17 DIAGNOSIS — Z90710 Acquired absence of both cervix and uterus: Secondary | ICD-10-CM | POA: Diagnosis not present

## 2021-02-17 DIAGNOSIS — K219 Gastro-esophageal reflux disease without esophagitis: Secondary | ICD-10-CM | POA: Insufficient documentation

## 2021-02-17 DIAGNOSIS — Z888 Allergy status to other drugs, medicaments and biological substances status: Secondary | ICD-10-CM | POA: Insufficient documentation

## 2021-02-17 DIAGNOSIS — Z885 Allergy status to narcotic agent status: Secondary | ICD-10-CM | POA: Insufficient documentation

## 2021-02-17 DIAGNOSIS — C50411 Malignant neoplasm of upper-outer quadrant of right female breast: Secondary | ICD-10-CM

## 2021-02-17 DIAGNOSIS — C50811 Malignant neoplasm of overlapping sites of right female breast: Secondary | ICD-10-CM | POA: Diagnosis not present

## 2021-02-17 DIAGNOSIS — F32A Depression, unspecified: Secondary | ICD-10-CM | POA: Diagnosis not present

## 2021-02-17 DIAGNOSIS — Z91040 Latex allergy status: Secondary | ICD-10-CM | POA: Diagnosis not present

## 2021-02-17 DIAGNOSIS — Z79899 Other long term (current) drug therapy: Secondary | ICD-10-CM | POA: Diagnosis not present

## 2021-02-17 DIAGNOSIS — Z7901 Long term (current) use of anticoagulants: Secondary | ICD-10-CM | POA: Diagnosis not present

## 2021-02-17 DIAGNOSIS — E785 Hyperlipidemia, unspecified: Secondary | ICD-10-CM | POA: Insufficient documentation

## 2021-02-17 DIAGNOSIS — F419 Anxiety disorder, unspecified: Secondary | ICD-10-CM | POA: Insufficient documentation

## 2021-02-17 DIAGNOSIS — I4891 Unspecified atrial fibrillation: Secondary | ICD-10-CM | POA: Diagnosis not present

## 2021-02-17 DIAGNOSIS — Z96641 Presence of right artificial hip joint: Secondary | ICD-10-CM | POA: Diagnosis not present

## 2021-02-17 DIAGNOSIS — I509 Heart failure, unspecified: Secondary | ICD-10-CM | POA: Insufficient documentation

## 2021-02-17 DIAGNOSIS — Z171 Estrogen receptor negative status [ER-]: Secondary | ICD-10-CM | POA: Insufficient documentation

## 2021-02-17 DIAGNOSIS — C50911 Malignant neoplasm of unspecified site of right female breast: Secondary | ICD-10-CM | POA: Diagnosis not present

## 2021-02-17 DIAGNOSIS — I340 Nonrheumatic mitral (valve) insufficiency: Secondary | ICD-10-CM | POA: Insufficient documentation

## 2021-02-17 DIAGNOSIS — I110 Hypertensive heart disease with heart failure: Secondary | ICD-10-CM | POA: Diagnosis not present

## 2021-02-17 DIAGNOSIS — Z803 Family history of malignant neoplasm of breast: Secondary | ICD-10-CM | POA: Insufficient documentation

## 2021-02-17 DIAGNOSIS — Z87891 Personal history of nicotine dependence: Secondary | ICD-10-CM | POA: Insufficient documentation

## 2021-02-17 DIAGNOSIS — R928 Other abnormal and inconclusive findings on diagnostic imaging of breast: Secondary | ICD-10-CM | POA: Diagnosis not present

## 2021-02-17 DIAGNOSIS — J449 Chronic obstructive pulmonary disease, unspecified: Secondary | ICD-10-CM | POA: Insufficient documentation

## 2021-02-17 DIAGNOSIS — G4733 Obstructive sleep apnea (adult) (pediatric): Secondary | ICD-10-CM | POA: Diagnosis not present

## 2021-02-17 DIAGNOSIS — I480 Paroxysmal atrial fibrillation: Secondary | ICD-10-CM

## 2021-02-17 HISTORY — DX: Malignant neoplasm of upper-outer quadrant of right female breast: C50.411

## 2021-02-17 HISTORY — PX: BREAST LUMPECTOMY,RADIO FREQ LOCALIZER,AXILLARY SENTINEL LYMPH NODE BIOPSY: SHX6900

## 2021-02-17 HISTORY — DX: Long term (current) use of anticoagulants: Z79.01

## 2021-02-17 HISTORY — DX: Restless legs syndrome: G25.81

## 2021-02-17 HISTORY — DX: Obstructive sleep apnea (adult) (pediatric): G47.33

## 2021-02-17 LAB — POCT I-STAT, CHEM 8
BUN: 22 mg/dL (ref 8–23)
Calcium, Ion: 1.11 mmol/L — ABNORMAL LOW (ref 1.15–1.40)
Chloride: 100 mmol/L (ref 98–111)
Creatinine, Ser: 1.3 mg/dL — ABNORMAL HIGH (ref 0.44–1.00)
Glucose, Bld: 101 mg/dL — ABNORMAL HIGH (ref 70–99)
HCT: 40 % (ref 36.0–46.0)
Hemoglobin: 13.6 g/dL (ref 12.0–15.0)
Potassium: 3.1 mmol/L — ABNORMAL LOW (ref 3.5–5.1)
Sodium: 140 mmol/L (ref 135–145)
TCO2: 30 mmol/L (ref 22–32)

## 2021-02-17 SURGERY — BREAST LUMPECTOMY,RADIO FREQ LOCALIZER,AXILLARY SENTINEL LYMPH NODE BIOPSY
Anesthesia: General | Laterality: Right

## 2021-02-17 MED ORDER — FENTANYL CITRATE (PF) 100 MCG/2ML IJ SOLN
INTRAMUSCULAR | Status: AC
  Filled 2021-02-17: qty 2

## 2021-02-17 MED ORDER — GABAPENTIN 300 MG PO CAPS
300.0000 mg | ORAL_CAPSULE | ORAL | Status: AC
  Administered 2021-02-17: 300 mg via ORAL

## 2021-02-17 MED ORDER — DEXAMETHASONE SODIUM PHOSPHATE 10 MG/ML IJ SOLN
INTRAMUSCULAR | Status: DC | PRN
  Administered 2021-02-17: 5 mg via INTRAVENOUS

## 2021-02-17 MED ORDER — SUCCINYLCHOLINE CHLORIDE 200 MG/10ML IV SOSY
PREFILLED_SYRINGE | INTRAVENOUS | Status: DC | PRN
  Administered 2021-02-17: 140 mg via INTRAVENOUS

## 2021-02-17 MED ORDER — LIDOCAINE HCL (CARDIAC) PF 100 MG/5ML IV SOSY
PREFILLED_SYRINGE | INTRAVENOUS | Status: DC | PRN
  Administered 2021-02-17: 100 mg via INTRAVENOUS

## 2021-02-17 MED ORDER — PHENYLEPHRINE HCL (PRESSORS) 10 MG/ML IV SOLN
INTRAVENOUS | Status: DC | PRN
  Administered 2021-02-17: 50 ug via INTRAVENOUS
  Administered 2021-02-17: 100 ug via INTRAVENOUS

## 2021-02-17 MED ORDER — ACETAMINOPHEN 500 MG PO TABS
ORAL_TABLET | ORAL | Status: AC
  Administered 2021-02-17: 1000 mg via ORAL
  Filled 2021-02-17: qty 2

## 2021-02-17 MED ORDER — IBUPROFEN 800 MG PO TABS: 800.0000 mg | ORAL_TABLET | Freq: Three times a day (TID) | ORAL | 0 refills | Status: DC | PRN

## 2021-02-17 MED ORDER — EPHEDRINE SULFATE 50 MG/ML IJ SOLN
INTRAMUSCULAR | Status: DC | PRN
  Administered 2021-02-17 (×2): 5 mg via INTRAVENOUS
  Administered 2021-02-17: 10 mg via INTRAVENOUS
  Administered 2021-02-17 (×2): 7.5 mg via INTRAVENOUS

## 2021-02-17 MED ORDER — MIDAZOLAM HCL 2 MG/2ML IJ SOLN
INTRAMUSCULAR | Status: DC | PRN
  Administered 2021-02-17: 1 mg via INTRAVENOUS

## 2021-02-17 MED ORDER — FENTANYL CITRATE (PF) 100 MCG/2ML IJ SOLN
INTRAMUSCULAR | Status: DC | PRN
  Administered 2021-02-17 (×2): 25 ug via INTRAVENOUS
  Administered 2021-02-17: 50 ug via INTRAVENOUS

## 2021-02-17 MED ORDER — ONDANSETRON HCL 4 MG/2ML IJ SOLN
INTRAMUSCULAR | Status: DC | PRN
  Administered 2021-02-17: 4 mg via INTRAVENOUS

## 2021-02-17 MED ORDER — CEFAZOLIN IN SODIUM CHLORIDE 3-0.9 GM/100ML-% IV SOLN
3.0000 g | INTRAVENOUS | Status: AC
Start: 2021-02-17 — End: 2021-02-17
  Administered 2021-02-17: 3 g via INTRAVENOUS
  Filled 2021-02-17: qty 100

## 2021-02-17 MED ORDER — CHLORHEXIDINE GLUCONATE 0.12 % MT SOLN
OROMUCOSAL | Status: AC
  Administered 2021-02-17: 15 mL via OROMUCOSAL
  Filled 2021-02-17: qty 15

## 2021-02-17 MED ORDER — EPHEDRINE 5 MG/ML INJ
INTRAVENOUS | Status: AC
  Filled 2021-02-17: qty 5

## 2021-02-17 MED ORDER — CELECOXIB 200 MG PO CAPS
ORAL_CAPSULE | ORAL | Status: AC
  Filled 2021-02-17: qty 1

## 2021-02-17 MED ORDER — BUPIVACAINE LIPOSOME 1.3 % IJ SUSP: 20.0000 mL | Freq: Once | INTRAMUSCULAR | Status: DC

## 2021-02-17 MED ORDER — CELECOXIB 200 MG PO CAPS
200.0000 mg | ORAL_CAPSULE | ORAL | Status: AC
  Administered 2021-02-17: 200 mg via ORAL

## 2021-02-17 MED ORDER — SODIUM CHLORIDE 0.9 % IV SOLN
INTRAVENOUS | Status: DC | PRN
  Administered 2021-02-17: 40 ug/min via INTRAVENOUS

## 2021-02-17 MED ORDER — BUPIVACAINE LIPOSOME 1.3 % IJ SUSP
INTRAMUSCULAR | Status: AC
  Filled 2021-02-17: qty 20

## 2021-02-17 MED ORDER — ORAL CARE MOUTH RINSE: 15.0000 mL | Freq: Once | OROMUCOSAL | Status: AC

## 2021-02-17 MED ORDER — FENTANYL CITRATE (PF) 100 MCG/2ML IJ SOLN
25.0000 ug | INTRAMUSCULAR | Status: DC | PRN
  Administered 2021-02-17: 25 ug via INTRAVENOUS

## 2021-02-17 MED ORDER — PROPOFOL 10 MG/ML IV BOLUS
INTRAVENOUS | Status: DC | PRN
Start: 2021-02-17 — End: 2021-02-17
  Administered 2021-02-17: 120 mg via INTRAVENOUS
  Administered 2021-02-17: 50 mg via INTRAVENOUS

## 2021-02-17 MED ORDER — TECHNETIUM TC 99M TILMANOCEPT KIT
1.0920 | PACK | Freq: Once | INTRAVENOUS | Status: AC | PRN
  Administered 2021-02-17: 1.092 via INTRADERMAL

## 2021-02-17 MED ORDER — BUPIVACAINE-EPINEPHRINE (PF) 0.25% -1:200000 IJ SOLN
INTRAMUSCULAR | Status: AC
  Filled 2021-02-17: qty 30

## 2021-02-17 MED ORDER — CHLORHEXIDINE GLUCONATE CLOTH 2 % EX PADS: 6.0000 | MEDICATED_PAD | Freq: Once | CUTANEOUS | Status: DC

## 2021-02-17 MED ORDER — DEXAMETHASONE SODIUM PHOSPHATE 10 MG/ML IJ SOLN
INTRAMUSCULAR | Status: AC
  Filled 2021-02-17: qty 1

## 2021-02-17 MED ORDER — SUCCINYLCHOLINE CHLORIDE 200 MG/10ML IV SOSY
PREFILLED_SYRINGE | INTRAVENOUS | Status: AC
  Filled 2021-02-17: qty 10

## 2021-02-17 MED ORDER — ISOSULFAN BLUE 1 % ~~LOC~~ SOLN
SUBCUTANEOUS | Status: AC
  Filled 2021-02-17: qty 5

## 2021-02-17 MED ORDER — MIDAZOLAM HCL 2 MG/2ML IJ SOLN
INTRAMUSCULAR | Status: AC
  Filled 2021-02-17: qty 2

## 2021-02-17 MED ORDER — GABAPENTIN 300 MG PO CAPS
ORAL_CAPSULE | ORAL | Status: AC
  Filled 2021-02-17: qty 1

## 2021-02-17 MED ORDER — CHLORHEXIDINE GLUCONATE 0.12 % MT SOLN: 15.0000 mL | Freq: Once | OROMUCOSAL | Status: AC

## 2021-02-17 MED ORDER — LACTATED RINGERS IV SOLN: INTRAVENOUS | Status: DC

## 2021-02-17 MED ORDER — ISOSULFAN BLUE 1 % ~~LOC~~ SOLN
SUBCUTANEOUS | Status: DC | PRN
  Administered 2021-02-17: 5 mL via SUBCUTANEOUS

## 2021-02-17 MED ORDER — ONDANSETRON HCL 4 MG/2ML IJ SOLN
INTRAMUSCULAR | Status: AC
  Filled 2021-02-17: qty 2

## 2021-02-17 MED ORDER — PROPOFOL 10 MG/ML IV BOLUS
INTRAVENOUS | Status: AC
  Filled 2021-02-17: qty 20

## 2021-02-17 MED ORDER — BUPIVACAINE-EPINEPHRINE (PF) 0.25% -1:200000 IJ SOLN
INTRAMUSCULAR | Status: DC | PRN
  Administered 2021-02-17: 14 mL

## 2021-02-17 MED ORDER — LIDOCAINE HCL (PF) 2 % IJ SOLN
INTRAMUSCULAR | Status: AC
  Filled 2021-02-17: qty 5

## 2021-02-17 MED ORDER — ACETAMINOPHEN 500 MG PO TABS: 1000.0000 mg | ORAL_TABLET | ORAL | Status: AC

## 2021-02-17 SURGICAL SUPPLY — 43 items
ADH SKN CLS APL DERMABOND .7 (GAUZE/BANDAGES/DRESSINGS) ×1
APL PRP STRL LF DISP 70% ISPRP (MISCELLANEOUS) ×1
APPLIER CLIP 9.375 SM OPEN (CLIP) ×2
APR CLP SM 9.3 20 MLT OPN (CLIP) ×1
BLADE SURG 15 STRL LF DISP TIS (BLADE) ×1 IMPLANT
BLADE SURG 15 STRL SS (BLADE) ×2
CANISTER SUCT 1200ML W/VALVE (MISCELLANEOUS) ×1 IMPLANT
CHLORAPREP W/TINT 26 (MISCELLANEOUS) ×2 IMPLANT
CLIP APPLIE 9.375 SM OPEN (CLIP) IMPLANT
CNTNR SPEC 2.5X3XGRAD LEK (MISCELLANEOUS)
CONT SPEC 4OZ STER OR WHT (MISCELLANEOUS)
CONT SPEC 4OZ STRL OR WHT (MISCELLANEOUS)
CONTAINER SPEC 2.5X3XGRAD LEK (MISCELLANEOUS) IMPLANT
DECANTER SPIKE VIAL GLASS SM (MISCELLANEOUS) ×2 IMPLANT
DERMABOND ADVANCED (GAUZE/BANDAGES/DRESSINGS) ×1
DERMABOND ADVANCED .7 DNX12 (GAUZE/BANDAGES/DRESSINGS) ×1 IMPLANT
DEVICE DUBIN SPECIMEN MAMMOGRA (MISCELLANEOUS) ×2 IMPLANT
DRAPE LAPAROTOMY TRNSV 106X77 (MISCELLANEOUS) ×2 IMPLANT
ELECT CAUTERY BLADE TIP 2.5 (TIP) ×2
ELECT REM PT RETURN 9FT ADLT (ELECTROSURGICAL) ×2
ELECTRODE CAUTERY BLDE TIP 2.5 (TIP) ×1 IMPLANT
ELECTRODE REM PT RTRN 9FT ADLT (ELECTROSURGICAL) ×1 IMPLANT
GAUZE 4X4 16PLY ~~LOC~~+RFID DBL (SPONGE) ×3 IMPLANT
GLOVE SURG ORTHO LTX SZ7.5 (GLOVE) ×4 IMPLANT
GOWN STRL REUS W/ TWL LRG LVL3 (GOWN DISPOSABLE) ×2 IMPLANT
GOWN STRL REUS W/TWL LRG LVL3 (GOWN DISPOSABLE) ×4
KIT MARKER MARGIN INK (KITS) ×1 IMPLANT
KIT TURNOVER KIT A (KITS) ×2 IMPLANT
MANIFOLD NEPTUNE II (INSTRUMENTS) ×2 IMPLANT
NEEDLE HYPO 22GX1.5 SAFETY (NEEDLE) ×2 IMPLANT
PACK BASIN MINOR ARMC (MISCELLANEOUS) ×2 IMPLANT
SET LOCALIZER 20 PROBE US (MISCELLANEOUS) ×2 IMPLANT
SET WALTER ACTIVATION W/DRAPE (SET/KITS/TRAYS/PACK) IMPLANT
SLEVE PROBE SENORX GAMMA FIND (MISCELLANEOUS) ×2 IMPLANT
SUT MNCRL 4-0 (SUTURE) ×2
SUT MNCRL 4-0 27XMFL (SUTURE) ×1
SUT VIC AB 3-0 SH 27 (SUTURE) ×2
SUT VIC AB 3-0 SH 27X BRD (SUTURE) ×1 IMPLANT
SUTURE MNCRL 4-0 27XMF (SUTURE) ×1 IMPLANT
SYR 10ML LL (SYRINGE) ×2 IMPLANT
SYR 20ML LL LF (SYRINGE) ×2 IMPLANT
TRAP NEPTUNE SPECIMEN COLLECT (MISCELLANEOUS) ×2 IMPLANT
WATER STERILE IRR 1000ML POUR (IV SOLUTION) ×2 IMPLANT

## 2021-02-17 NOTE — Anesthesia Procedure Notes (Signed)
Procedure Name: Intubation Date/Time: 02/17/2021 9:32 AM Performed by: Marshell Garfinkel, RN Pre-anesthesia Checklist: Patient identified, Emergency Drugs available, Suction available and Patient being monitored Patient Re-evaluated:Patient Re-evaluated prior to induction Oxygen Delivery Method: Circle system utilized Preoxygenation: Pre-oxygenation with 100% oxygen Induction Type: IV induction Ventilation: Mask ventilation without difficulty and Oral airway inserted - appropriate to patient size Laryngoscope Size: McGraph and 3 Grade View: Grade I Tube type: Oral Number of attempts: 1 Airway Equipment and Method: Stylet and Oral airway Placement Confirmation: ETT inserted through vocal cords under direct vision, positive ETCO2 and breath sounds checked- equal and bilateral Secured at: 23 cm Tube secured with: Tape Dental Injury: Teeth and Oropharynx as per pre-operative assessment

## 2021-02-17 NOTE — Discharge Instructions (Signed)
AMBULATORY SURGERY  ?DISCHARGE INSTRUCTIONS ? ? ?The drugs that you were given will stay in your system until tomorrow so for the next 24 hours you should not: ? ?Drive an automobile ?Make any legal decisions ?Drink any alcoholic beverage ? ? ?You may resume regular meals tomorrow.  Today it is better to start with liquids and gradually work up to solid foods. ? ?You may eat anything you prefer, but it is better to start with liquids, then soup and crackers, and gradually work up to solid foods. ? ? ?Please notify your doctor immediately if you have any unusual bleeding, trouble breathing, redness and pain at the surgery site, drainage, fever, or pain not relieved by medication. ? ? ? ?Additional Instructions: ? ? ? ?Please contact your physician with any problems or Same Day Surgery at [phone], Monday through Friday 6 am to 4 pm, or [facility] at [place] Main number at [phone].  ?

## 2021-02-17 NOTE — Transfer of Care (Signed)
Immediate Anesthesia Transfer of Care Note  Patient: Amber Barnes  Procedure(s) Performed: BREAST [place] SENTINEL LYMPH NODE BIOPSY (Right)  Patient Location: PACU  Anesthesia Type:General  Level of Consciousness: drowsy and patient cooperative  Airway & Oxygen Therapy: Patient Spontanous Breathing and Patient connected to face mask oxygen  Post-op Assessment: Report given to RN and Post -op Vital signs reviewed and stable  Post vital signs: Reviewed and stable  Last Vitals:  Vitals Value Taken Time  BP 132/65 02/17/21 1118  Temp 36.3 C 02/17/21 1118  Pulse 55 02/17/21 1121  Resp 14 02/17/21 1121  SpO2 100 % 02/17/21 1121  Vitals shown include unvalidated device data.  Last Pain:  Vitals:   02/17/21 0903  TempSrc: Temporal  PainSc: 0-No pain         Complications: No notable events documented.

## 2021-02-17 NOTE — Interval H&P Note (Signed)
History and Physical Interval Note:  02/17/2021 9:09 AM  Amber Barnes  has presented today for surgery, with the diagnosis of right breast cancer.  The various methods of treatment have been discussed with the patient and family. After consideration of risks, benefits and other options for treatment, the patient has consented to  Procedure(s): [place] (Right) as a surgical intervention.  The patient's history has been reviewed, patient examined, no change in status, stable for surgery.  I have reviewed the patient's chart and labs.  Questions were answered to the patient's satisfaction.    The right breast is marked.   Ronny Bacon

## 2021-02-17 NOTE — Anesthesia Preprocedure Evaluation (Signed)
Anesthesia Evaluation  Patient identified by MRN, date of birth, ID band Patient awake    Reviewed: Allergy & Precautions, NPO status , Patient's Chart, lab work & pertinent test results  History of Anesthesia Complications (+) Emergence Delirium and history of anesthetic complications  Airway Mallampati: III  TM Distance: <3 FB Neck ROM: full    Dental  (+) Chipped, Poor Dentition, Missing, Caps   Pulmonary sleep apnea , COPD, former smoker,    Pulmonary exam normal        Cardiovascular Exercise Tolerance: Good hypertension, +CHF  + dysrhythmias Atrial Fibrillation      Neuro/Psych  Headaches, PSYCHIATRIC DISORDERS    GI/Hepatic negative GI ROS, Neg liver ROS, PUD, GERD  Medicated and Controlled,  Endo/Other  negative endocrine ROS  Renal/GU      Musculoskeletal  (+) Arthritis ,   Abdominal   Peds  Hematology negative hematology ROS (+)   Anesthesia Other Findings Past Medical History: No date: Achilles tendinitis No date: Acute medial meniscal tear No date: Allergy No date: Anxiety No date: Atrial fibrillation [facility])     Comment:  a. new onset 07/2016; b. CHADS2VASc --> 4 (HTN, female,               age, CHF); c. eliquis No date: CHF (congestive heart failure) ([place]) No date: Chronic anticoagulation     Comment:  Apixaban No date: Chronic lower back pain No date: Colon polyp No date: Complication of anesthesia     Comment:  Emergence delirium; hypoxia No date: COPD (chronic obstructive pulmonary disease) (HCC) No date: Cystic breast No date: Depression No date: Endometriosis No date: Fibrocystic breast disease No date: GERD (gastroesophageal reflux disease) No date: History of stress test     Comment:  a. 07/2016 MV: EF 55-65%. Small, mild defect  in mid               anteroseptal, apical septal, and apical locations. No               ischemia-->low risk. No date: Hyperlipidemia No date:  Hypertension No date: Insomnia No date: Lipoma No date: Malignant neoplasm of upper-outer quadrant of right female  breast (HCC)     Comment:  Stage 1A invasive mammary carcinoma (cT1b or T1c N0); ER              (-), PR (+), HER2/neu (+) No date: Migraine No date: Mild Mitral regurgitation     Comment:  a. 07/2016 Echo: EF 55-65%, no rwma, mild MR. Mildly dil               LA. Nl RV fx.  No date: OSA (obstructive sleep apnea)     Comment:  non-compliant with nocturnal PAP therapy No date: Osteoarthritis     Comment:  left knee No date: Osteopenia No date: Peptic ulcer disease     Comment:  a. H. pylori No date: Pneumonia No date: RLS (restless legs syndrome) No date: Tendonitis No date: Thyroid disease No date: Vitamin D deficiency  Past Surgical History: No date: ABDOMINAL HYSTERECTOMY     Comment:  ovaries left No date: ACHILLES TENDON SURGERY     Comment:  2012,01/2017 No date: BACK SURGERY     Comment:  fusion lumbar 01/15/2021: BREAST BIOPSY; Right     Comment:  Affirm biopsy-"X" clip-INVASIVE MAMMARY CARCINOMA 01/15/2021: BREAST BIOPSY; Right     Comment:  u/s bx-"venus" clip INVASIVE MAMMARY CARCINOMA No date: BREAST BIOPSY 09/07/2016: CARDIOVERSION; N/A  Comment:  Procedure: CARDIOVERSION;  Surgeon: Minna Merritts,               MD;  Location: [facility] ORS;  Service: Cardiovascular;                Laterality: N/A; 10/19/2016: CARDIOVERSION; N/A     Comment:  Procedure: CARDIOVERSION;  Surgeon: Minna Merritts,               MD;  Location: [facility] ORS;  Service: Cardiovascular;                Laterality: N/A; 12/14/2016: CARDIOVERSION; N/A     Comment:  Procedure: CARDIOVERSION;  Surgeon: Minna Merritts,               MD;  Location: [facility] ORS;  Service: Cardiovascular;                Laterality: N/A; No date: COLONOSCOPY 05/13/2020: COLONOSCOPY WITH PROPOFOL; N/A     Comment:  Procedure: COLONOSCOPY WITH PROPOFOL;  Surgeon: Toledo,               Benay Pike,  MD;  Location: [facility];  Service:               Gastroenterology;  Laterality: N/A;  ELIQUIS No date: ESOPHAGOGASTRODUODENOSCOPY 05/13/2020: ESOPHAGOGASTRODUODENOSCOPY (EGD) WITH PROPOFOL; N/A     Comment:  Procedure: ESOPHAGOGASTRODUODENOSCOPY (EGD) WITH               PROPOFOL;  Surgeon: Toledo, Benay Pike, MD;  Location:               [facility];  Service: Gastroenterology;  Laterality:               N/A; No date: FOOT SURGERY; Left     Comment:  tendon No date: JOINT REPLACEMENT No date: THYROIDECTOMY     Comment:  thyroid lobectomy secondary to a benign nodule 08/21/2018: TOTAL HIP ARTHROPLASTY; Right     Comment:  Procedure: TOTAL HIP ARTHROPLASTY ANTERIOR               APPROACH-RIGHT CELL SAVER REQUESTED;  Surgeon: [name], MD;  Location: [facility] ORS;  Service: Orthopedics;               Laterality: Right;     Reproductive/Obstetrics negative OB ROS                             Anesthesia Physical Anesthesia Plan  ASA: 3  Anesthesia Plan: General ETT   Post-op Pain Management:    Induction: Intravenous  PONV Risk Score and Plan: Ondansetron, Dexamethasone, Midazolam and Treatment may vary due to age or medical condition  Airway Management Planned: Oral ETT  Additional Equipment:   Intra-op Plan:   Post-operative Plan: Extubation in OR  Informed Consent: I have reviewed the patients History and Physical, chart, labs and discussed the procedure including the risks, benefits and alternatives for the proposed anesthesia with the patient or authorized representative who has indicated his/her understanding and acceptance.     Dental Advisory Given  Plan Discussed with: Anesthesiologist, CRNA and Surgeon  Anesthesia Plan Comments: (Patient consented for risks of anesthesia including but not limited to:  - adverse reactions to medications - damage to eyes, teeth, lips or other oral mucosa - nerve damage due to  positioning  -  sore throat or hoarseness - Damage to heart, brain, nerves, lungs, other parts of body or loss of life  Patient voiced understanding.)        Anesthesia Quick Evaluation

## 2021-02-17 NOTE — Op Note (Signed)
  Pre-operative Diagnosis: Breast Cancer, Right.     Post-operative Diagnosis: Same  Surgeon: Ronny Bacon, M.D., FACS  Anesthesia: GETA  Procedure: Right Lumpectomy, RFID tag directed, sentinel node biopsy  Procedure Details  The patient was seen again in the Holding Room. The benefits, complications, treatment options, and expected outcomes were discussed with the patient. The risks of bleeding, infection, recurrence of symptoms, failure to resolve symptoms, hematoma, seroma, open wound, cosmetic deformity, and the need for further surgery were discussed.  The patient was taken to Operating Room, identified as Amber Barnes and the procedure verified.  A Time Out was held and the above information confirmed.  Prior to the induction of general anesthesia, antibiotic prophylaxis was administered. VTE prophylaxis was in place. The patient was positioned in the supine position. Appropriate anesthesia was then administered and tolerated well. The LOCALizer is used to mark the skin for incision.  A visual dye  Isosulfan Blue 4 ml was injected periareolar dermis early under aseptic conditions.  Massage was administered to this area for 5 minutes prior to securely taping it.  The chest was prepped with Chloraprep and draped in the sterile fashion.    Attention was turned to the RFID tag localization site where an incision was made. Dissection using the LOCALizer to perform a lumpectomy with generous margins was performed. This was done with electrocautery and sharp dissection with Mayo scissors. There was minimal bleeding, and the cavity packed.  The specimen was taken to the back table and painted to demarcate the 6 surfaces of potential margin.   I returned to the cavity to remove the packing, and hemostasis was confirmed with electrocautery.    Dissection to the inferior lateral edge of the pectoralis major is done, and with direction by the probe aiding in dissection of a lymph node with counts  of 9,000, which was sent for permanent section. Background counts were less than 100.  This is the only blue LN noted.  Once assuring that hemostasis was adequate and checked multiple times the wound was closed with interrupted 3-0 Vicryl followed by 4-0 subcuticular Monocryl sutures.  The axillary wound was closed in a similar fashion. Dermabond is utilized to seal the incision.    Findings: Faxitron imaging: All markers present and central in specimen.   Estimated Blood Loss: Minimal         Drains: None         Specimens:  Right lateral breast tissue.        Complications: None.          Condition: Stable  Sentinel Node Biopsy Synoptic Operative Report  Operation performed with curative intent:Yes  Tracer(s) used to identify sentinel nodes in the upfront surgery (non-neoadjuvant) setting (select all that apply):Dye and Radioactive Tracer  Tracer(s) used to identify sentinel nodes in the neoadjuvant setting (select all that apply):N/A  All nodes (colored or non-colored) present at the end of a dye-filled lymphatic channel were removed:Yes   All significantly radioactive nodes were removed:Yes  All palpable suspicious nodes were removed:Yes  Biopsy-proven positive nodes marked with clips prior to chemotherapy were identified and removed:N/A    Ronny Bacon, M.D., [facility]  02/17/2021 ; 11:18 AM

## 2021-02-18 ENCOUNTER — Other Ambulatory Visit: Payer: Self-pay | Admitting: Pathology

## 2021-02-18 LAB — SURGICAL PATHOLOGY

## 2021-02-18 NOTE — Anesthesia Postprocedure Evaluation (Signed)
Anesthesia Post Note  Patient: Amber Barnes  Procedure(s) Performed: BREAST LUMPECTOMY,RADIO FREQ LOCALIZER,AXILLARY SENTINEL LYMPH NODE BIOPSY (Right)  Patient location during evaluation: PACU Anesthesia Type: General Level of consciousness: awake and alert Pain management: pain level controlled Vital Signs Assessment: post-procedure vital signs reviewed and stable Respiratory status: spontaneous breathing, nonlabored ventilation, respiratory function stable and patient connected to nasal cannula oxygen Cardiovascular status: blood pressure returned to baseline and stable Postop Assessment: no apparent nausea or vomiting Anesthetic complications: no   No notable events documented.   Last Vitals:  Vitals:   02/17/21 1217 02/17/21 1221  BP:  (!) 130/55  Pulse: (!) 56   Resp: 16   Temp: (!) 36.2 C   SpO2: 99%     Last Pain:  Vitals:   02/17/21 1217  TempSrc: Temporal  PainSc: 0-No pain                 Precious Haws [name]

## 2021-02-19 ENCOUNTER — Other Ambulatory Visit: Payer: Self-pay | Admitting: Family

## 2021-02-19 NOTE — Telephone Encounter (Signed)
Rx(s) sent to pharmacy electronically.  

## 2021-02-22 ENCOUNTER — Other Ambulatory Visit: Payer: Self-pay

## 2021-02-22 DIAGNOSIS — E876 Hypokalemia: Secondary | ICD-10-CM

## 2021-02-23 LAB — SURGICAL PATHOLOGY

## 2021-02-26 ENCOUNTER — Other Ambulatory Visit (INDEPENDENT_AMBULATORY_CARE_PROVIDER_SITE_OTHER): Payer: Medicare Other

## 2021-02-26 ENCOUNTER — Other Ambulatory Visit: Payer: Self-pay

## 2021-02-26 DIAGNOSIS — E876 Hypokalemia: Secondary | ICD-10-CM

## 2021-02-26 LAB — BASIC METABOLIC PANEL
BUN: 19 mg/dL (ref 6–23)
CO2: 28 mEq/L (ref 19–32)
Calcium: 9 mg/dL (ref 8.4–10.5)
Chloride: 100 mEq/L (ref 96–112)
Creatinine, Ser: 1.06 mg/dL (ref 0.40–1.20)
GFR: 54.08 mL/min — ABNORMAL LOW (ref >60.00)
Glucose, Bld: 89 mg/dL (ref 70–99)
Potassium: 3.4 mEq/L — ABNORMAL LOW (ref 3.5–5.1)
Sodium: 140 mEq/L (ref 135–145)

## 2021-03-01 ENCOUNTER — Other Ambulatory Visit: Payer: Self-pay | Admitting: Family

## 2021-03-01 DIAGNOSIS — F419 Anxiety disorder, unspecified: Secondary | ICD-10-CM

## 2021-03-02 ENCOUNTER — Ambulatory Visit (INDEPENDENT_AMBULATORY_CARE_PROVIDER_SITE_OTHER): Payer: Medicare Other | Admitting: Surgery

## 2021-03-02 ENCOUNTER — Other Ambulatory Visit: Payer: Self-pay

## 2021-03-02 ENCOUNTER — Encounter: Payer: Self-pay | Admitting: Surgery

## 2021-03-02 VITALS — BP 128/86 | HR 55 | Temp 98.4°F | Ht 65.0 in | Wt 284.0 lb

## 2021-03-02 DIAGNOSIS — Z171 Estrogen receptor negative status [ER-]: Secondary | ICD-10-CM

## 2021-03-02 DIAGNOSIS — C50411 Malignant neoplasm of upper-outer quadrant of right female breast: Secondary | ICD-10-CM

## 2021-03-02 NOTE — Patient Instructions (Signed)
Keep your appointment with Dr Tasia Catchings 03/03/21. Please call our office if you have any questions or concerns. You may resume driving your car.   Implanted Port Insertion Implanted port insertion is a procedure to put in a port and catheter. The port is a device with an injectable disk that can be accessed by your health care provider. The port is connected to a vein in the chest or neck by a small flexible tube (catheter). There are different types of ports. The implanted port may be used as a long-term IV access for: Medicines, such as chemotherapy. Fluids. Liquid nutrition, such as total parenteral nutrition (TPN). When you have a port, your health care provider can choose to use the portinstead of veins in your arms for these procedures. Tell a health care provider about: Any allergies you have. All medicines you are taking, especially blood thinners, as well as any vitamins, herbs, eye drops, creams, over-the-counter medicines, and steroids. Any problems you or family members have had with anesthetic medicines. Any blood disorders you have. Any surgeries you have had. Any medical conditions you have or have had, including diabetes or kidney problems. Whether you are pregnant or may be pregnant. What are the risks? Generally, this is a safe procedure. However, problems may occur, including: Allergic reactions to medicines or dyes. Damage to other structures or organs. Infection. Damage to the blood vessel, bruising, or bleeding at the puncture site. Blood clot. Breakdown of the skin over the port. A collection of air in the chest that can cause one of the lungs to collapse (pneumothorax). This is rare. What happens before the procedure? Staying hydrated Follow instructions from your health care provider about hydration, which may include: Up to 2 hours before the procedure - you may continue to drink clear liquids, such as water, clear fruit juice, black coffee, and plain tea.  Eating and  drinking restrictions Follow instructions from your health care provider about eating and drinking, which may include: 8 hours before the procedure - stop eating heavy meals or foods, such as meat, fried foods, or fatty foods. 6 hours before the procedure - stop eating light meals or foods, such as toast or cereal. 6 hours before the procedure - stop drinking milk or drinks that contain milk. 2 hours before the procedure - stop drinking clear liquids. Medicines Ask your health care provider about: Changing or stopping your regular medicines. This is especially important if you are taking diabetes medicines or blood thinners. Taking medicines such as aspirin and ibuprofen. These medicines can thin your blood. Do not take these medicines unless your health care provider tells you to take them. Taking over-the-counter medicines, vitamins, herbs, and supplements. General instructions Plan to have someone take you home from the hospital or clinic. If you will be going home right after the procedure, plan to have someone with you for 24 hours. You may have blood tests. Do not use any products that contain nicotine or tobacco for at least 4-6 weeks before the procedure. These products include cigarettes, e-cigarettes, and chewing tobacco. If you need help quitting, ask your health care provider. Ask your health care provider what steps will be taken to help prevent infection. These may include: Removing hair at the surgery site. Washing skin with a germ-killing soap. Taking antibiotic medicine. What happens during the procedure?  An IV will be inserted into one of your veins. You will be given one or more of the following: A medicine to help you relax (sedative).  A medicine to numb the area (local anesthetic). Two small incisions will be made to insert the port. One smaller incision will be made in your neck to get access to the vein where the catheter will lie. The other incision will be made  in the upper chest. This is where the port will lie. The procedure may be done using continuous X-ray (fluoroscopy) or other imaging tools for guidance. The port and catheter will be placed. There may be a small, raised area where the port is. The port will be flushed with a salt solution (saline), and blood will be drawn to make sure that it is working correctly. The incisions will be closed. Bandages (dressings) may be placed over the incisions. The procedure may vary among health care providers and hospitals. What happens after the procedure? Your blood pressure, heart rate, breathing rate, and blood oxygen level will be monitored until you leave the hospital or clinic. Do not drive for 24 hours if you were given a sedative during your procedure. You will be given a manufacturer's information card for the type of port that you have. Keep this with you. Your port will need to be flushed and checked as told by your health care provider, usually every few weeks. A chest X-ray will be done to: Check the placement of the port. Make sure there is no injury to your lung. Summary Implanted port insertion is a procedure to put in a port and catheter. The implanted port is used as a long-term IV access. The port will need to be flushed and checked as told by your health care provider, usually every few weeks. Keep your manufacturer's information card with you at all times. This information is not intended to replace advice given to you by your health care provider. Make sure you discuss any questions you have with your healthcare provider. Document Revised: 08/08/2019 Document Reviewed: 01/23/2018 Elsevier Patient Education  Centre.

## 2021-03-02 NOTE — Progress Notes (Signed)
[facility] POST-OP OFFICE VISIT  03/02/2021  HPI: Amber Barnes is a 70 y.o. female 13 days s/p right breast lumpectomy and SLNBx.  She has no complaints, denies any numbness or axillary pain.  Some periincisional numbness as expected.  Denies any history of bruising or ecchymosis.  Vital signs: There were no vitals taken for this visit.   Physical Exam: Constitutional: She appears well.  Skin: Right breast incision is clean, dry and intact.  There is no dermal edema, induration or erythema.  There is no mass-effect or evidence of seroma or hematoma present.  SURGICAL PATHOLOGY  CASE: [phone]  PATIENT: [facility]  Surgical Pathology Report      Specimen Submitted:  A. Breast, right  B. Breast, right extra  C. Sentinel node, right axilla   Clinical History: Right breast cancer       DIAGNOSIS:  A. BREAST, RIGHT LATERAL; LUMPECTOMY:  - INVASIVE MAMMARY CARCINOMA, TWO SEPARATE FOCI.  - DUCTAL CARCINOMA IN SITU (DCIS).  - SEE CANCER SUMMARY BELOW.  - TWO BIOPSY SITES WITH VENUS AND X-SHAPED CLIPS.  - RF ID TAG IN PLACE.   B.  BREAST, RIGHT EXTRA; EXCISION:  - BENIGN BREAST TISSUE.  - NEGATIVE FOR MALIGNANCY.   C.  SENTINEL LYMPH NODE, RIGHT #1; EXCISION:  - ONE LYMPH NODE NEGATIVE FOR MALIGNANCY (0/1).   CANCER CASE SUMMARY: INVASIVE CARCINOMA OF THE BREAST  Standard(s): AJCC-UICC 8   SPECIMEN  Procedure: Lumpectomy  Specimen Laterality: Right   TUMOR  Histologic Type: Invasive carcinoma of no special type  Histologic Grade (Nottingham Histologic Score)                       Glandular (Acinar)/Tubular Differentiation: 3                       Nuclear Pleomorphism: 2                       Mitotic Rate: 3                       Overall Grade: 3  Tumor Size: Greatest dimension of largest invasive focus measures 10 mm  Ductal Carcinoma In Situ (DCIS): Present, nuclear grade 2 with focal  comedonecrosis  Lymphovascular Invasion:  Not identified  Treatment Effect in the Breast: No known presurgical therapy   MARGINS  Margin Status for Invasive Carcinoma: All margins negative for invasive  carcinoma                       Distance from closest margin: 2.1 mm                       Specify closest margin: Superior   Margin Status for DCIS: All margins negative for DCIS                       Distance from DCIS to closest margin: 3 mm                       Specify closest margin: Anterior    REGIONAL LYMPH NODES  Regional Lymph Node Status: All regional lymph nodes negative for tumor                       Total Number of Lymph Nodes Examined (sentinel  and  non-sentinel): 1                       Number of Sentinel Nodes Examined: 1   DISTANT METASTASIS  Distant Site(s) Involved, if applicable: Not applicable   PATHOLOGIC STAGE CLASSIFICATION (pTNM, AJCC 8th Edition):  TNM Descriptors: (Multiple foci of invasive carcinoma)  pT1b  Regional Lymph Nodes Modifier: (sn)  pN0  pM - Not applicable   SPECIAL STUDIES  Breast Biomarker Testing Performed on Previous Biopsy: [id number] A1  (X-clip)  Estrogen Receptor (ER) Status: Negative  Progesterone Receptor (PgR) Status: Positive  HER2 (by immunohistochemistry): Positive   Breast biomarkers have been ordered on [id number] B1 (venus clip) and  are pending at the time of the sign-out of this case.   Comment:  Residual invasive carcinoma and DCIS are present at both of the previous  biopsy sites and the morphology is the same. At the venus clip, the  residual carcinoma measures 10 mm. Invasive carcinoma is 2.1 mm and DCIS  is 13 mm from the superior margin. At the site of the X clip the  residual carcinoma measures 3 mm.  Invasive carcinoma is 4 mm from the  posterior margin and DCIS is 3 mm to the anterior margin.  Assessment/Plan: This is a 70 y.o. female 13 days s/p right breast lumpectomy and SLNBx.   Patient Active Problem List   Diagnosis Date Noted    HER2-positive carcinoma of breast ([place]) 01/26/2021   Goals of care, counseling/discussion 01/26/2021   Malignant neoplasm of upper-outer quadrant of right female breast ([place]) 01/19/2021   Left arm pain 12/02/2020   Soft tissue mass 12/02/2020   Candidal intertrigo 09/09/2020   Urinary frequency 09/09/2020   Night sweat 03/31/2020   Nasal drainage 12/18/2019   Sinus congestion 12/18/2019   Nausea 10/30/2019   Major depression in remission ([place]) 08/27/2018   Primary osteoarthritis involving multiple joints 08/27/2018   Atrial fibrillation ([place]) 08/27/2018   COPD (chronic obstructive pulmonary disease) ([place]) 08/27/2018   BMI 50.0-59.9, adult ([place]) 07/16/2018   Morbid obesity with BMI of 50.0-59.9, adult ([place]) 05/31/2018   Bradycardia 05/22/2017   Restless leg syndrome 04/13/2017   OSA (obstructive sleep apnea) 01/09/2017   Fatty liver disease, nonalcoholic 80/09/4915   Family history of ovarian cancer 06/08/2016   GERD (gastroesophageal reflux disease) 05/05/2016   Hypertension 11/11/2014   Anxiety and depression 11/11/2014   Weight loss 11/11/2014    -Repeat prognostic indicators are seem to be still pending from the excision. We discussed the roles of radiation treatment, and the potential utilization of Herceptin based on her HER2 positivity.  We discussed briefly the possible role of Port-A-Cath placement and the risks inherent with it.  I will be glad to assist in her further care as needed.  Ronny Bacon M.D., FACS 03/02/2021, 1:36 PM

## 2021-03-03 ENCOUNTER — Encounter: Payer: Self-pay | Admitting: Oncology

## 2021-03-03 ENCOUNTER — Other Ambulatory Visit: Payer: Self-pay | Admitting: Family

## 2021-03-03 ENCOUNTER — Inpatient Hospital Stay: Payer: Medicare Other | Attending: Oncology | Admitting: Oncology

## 2021-03-03 VITALS — BP 134/78 | HR 55 | Temp 96.3°F | Resp 17 | Wt 286.0 lb

## 2021-03-03 DIAGNOSIS — M8580 Other specified disorders of bone density and structure, unspecified site: Secondary | ICD-10-CM | POA: Diagnosis not present

## 2021-03-03 DIAGNOSIS — E079 Disorder of thyroid, unspecified: Secondary | ICD-10-CM | POA: Diagnosis not present

## 2021-03-03 DIAGNOSIS — R110 Nausea: Secondary | ICD-10-CM | POA: Insufficient documentation

## 2021-03-03 DIAGNOSIS — R42 Dizziness and giddiness: Secondary | ICD-10-CM | POA: Insufficient documentation

## 2021-03-03 DIAGNOSIS — C50919 Malignant neoplasm of unspecified site of unspecified female breast: Secondary | ICD-10-CM

## 2021-03-03 DIAGNOSIS — Z7189 Other specified counseling: Secondary | ICD-10-CM | POA: Diagnosis not present

## 2021-03-03 DIAGNOSIS — J449 Chronic obstructive pulmonary disease, unspecified: Secondary | ICD-10-CM | POA: Diagnosis not present

## 2021-03-03 DIAGNOSIS — R195 Other fecal abnormalities: Secondary | ICD-10-CM | POA: Insufficient documentation

## 2021-03-03 DIAGNOSIS — K760 Fatty (change of) liver, not elsewhere classified: Secondary | ICD-10-CM | POA: Diagnosis not present

## 2021-03-03 DIAGNOSIS — F17210 Nicotine dependence, cigarettes, uncomplicated: Secondary | ICD-10-CM | POA: Diagnosis not present

## 2021-03-03 DIAGNOSIS — C50411 Malignant neoplasm of upper-outer quadrant of right female breast: Secondary | ICD-10-CM | POA: Diagnosis not present

## 2021-03-03 DIAGNOSIS — E559 Vitamin D deficiency, unspecified: Secondary | ICD-10-CM | POA: Diagnosis not present

## 2021-03-03 DIAGNOSIS — I4891 Unspecified atrial fibrillation: Secondary | ICD-10-CM | POA: Diagnosis not present

## 2021-03-03 DIAGNOSIS — Z79899 Other long term (current) drug therapy: Secondary | ICD-10-CM | POA: Insufficient documentation

## 2021-03-03 DIAGNOSIS — Z800 Family history of malignant neoplasm of digestive organs: Secondary | ICD-10-CM | POA: Diagnosis not present

## 2021-03-03 DIAGNOSIS — Z171 Estrogen receptor negative status [ER-]: Secondary | ICD-10-CM

## 2021-03-03 DIAGNOSIS — R634 Abnormal weight loss: Secondary | ICD-10-CM | POA: Diagnosis not present

## 2021-03-03 DIAGNOSIS — Z7901 Long term (current) use of anticoagulants: Secondary | ICD-10-CM | POA: Insufficient documentation

## 2021-03-03 DIAGNOSIS — Z803 Family history of malignant neoplasm of breast: Secondary | ICD-10-CM | POA: Diagnosis not present

## 2021-03-03 DIAGNOSIS — K219 Gastro-esophageal reflux disease without esophagitis: Secondary | ICD-10-CM | POA: Diagnosis not present

## 2021-03-03 DIAGNOSIS — Z806 Family history of leukemia: Secondary | ICD-10-CM | POA: Insufficient documentation

## 2021-03-03 DIAGNOSIS — Z8041 Family history of malignant neoplasm of ovary: Secondary | ICD-10-CM

## 2021-03-03 DIAGNOSIS — G4730 Sleep apnea, unspecified: Secondary | ICD-10-CM | POA: Diagnosis not present

## 2021-03-03 DIAGNOSIS — M1990 Unspecified osteoarthritis, unspecified site: Secondary | ICD-10-CM | POA: Insufficient documentation

## 2021-03-03 DIAGNOSIS — I10 Essential (primary) hypertension: Secondary | ICD-10-CM

## 2021-03-03 DIAGNOSIS — I509 Heart failure, unspecified: Secondary | ICD-10-CM | POA: Diagnosis not present

## 2021-03-03 HISTORY — DX: Malignant neoplasm of upper-outer quadrant of right female breast: C50.411

## 2021-03-03 MED ORDER — POTASSIUM CHLORIDE CRYS ER 20 MEQ PO TBCR: 20.0000 meq | EXTENDED_RELEASE_TABLET | Freq: Two times a day (BID) | ORAL | 2 refills | Status: DC

## 2021-03-03 NOTE — Progress Notes (Signed)
Hematology/Oncology Consult note [facility] Telephone:([phone] [phone]   Patient Care Team: [name], FNP as PCP - General (Family Medicine) [name] [name], MD as PCP - Cardiology (Cardiology) Charletta Cousin, MD (Inactive) [name], [name], MD as Consulting Physician (Cardiology) Rico Junker, RN as Oncology Nurse Navigator  REFERRING PROVIDER: [name], FNP  CHIEF COMPLAINTS/REASON FOR VISIT:  Follow-up for stage I HER2 positive breast cancer  HISTORY OF PRESENTING ILLNESS:   Amber Barnes is a  70 y.o.  female with PMH listed below was seen in consultation at the request of  [name], FNP  for evaluation of weight loss, family history ovarian cancer. Patient reports a history of nausea vomiting, early satiety, dysphagia symptoms that started in March.  She has lost significant amount of weight as well. Patient weighed 340 pounds in February 2021, 309 pounds in May 2021 and currently she weighs 296 pounds today. Patient has tried Zofran and scopolamine patch which did not relieve her symptoms. 11/05/2019, CT abdomen pelvis showed chronic changes with out acute abnormality to correspond with the patient's symptoms. Patient has had right upper quadrant ultrasound done on 11/14/2019, which showed hepatic steatosis with no other acute abnormality. Patient was seen by gastroenterology Dr. [name] on 11/18/2019.  Patient was recommended to take PPI daily.  Added Carafate.  H. pylori testing came back positive and the patient was started on a course of tetracycline, metronidazole, Pepto-Bismol for H. pylori treatments.  Patient reports that she has been on the treatment for 7 days now.  Nausea is slightly better but still persistent.  She is following up with gastroenterology next week for discussion of EGD. Currently she is not able to tolerate colonoscopy prep due to the nausea.  Her stool was tested positive for occult  blood. She also reports night sweating which is a chronic symptom for her.   INTERVAL HISTORY Amber Barnes is a 70 y.o. female who has above history reviewed by me today presents to reestablish care for newly diagnosed right breast cancer.    Patient was last seen on 05/20/2020 for evaluation of weight loss, family history of ovarian cancer patient was referred to establish care with genetic counselor.  Patient eventually declined genetic testing.  12/30/2020, patient had a screening mammogram done which showed possible mass and asymmetry in the right breast. 01/08/2021, diagnostic mammogram of the right breast showed 2 adjacent irregular spiculated masses.  Larger 1 was 10 mm and a smaller mass measured 5 mm. 01/15/2021 stereotactic biopsy of the right breast smaller mass showed invasive carcinoma no special type, grade 2, DCIS present, LVI negative. Ultrasound guided biopsy of the larger mass showed invasive mammary carcinoma,   Patient has appointment with surgery Dr. [name] later this week.  She was referred to establish care with oncology for evaluation.  She was accompanied by daughter. She has no new complaints.  Family history of breast cancer: Denies Family history of other cancers: Patient has a strong family history of leukemia in her mother, colon cancer in her father at age of 28, ovarian cancer in her sister at age of 24, CLL in her brother   INTERVAL HISTORY Amber Barnes is a 70 y.o. female who has above history reviewed by me today presents for follow up visit for management of right side HER 2 positive breast cancer Problems and complaints are listed below: 02/17/2021, patient is status post lumpectomy and sentinel lymph node biopsy Pathology showed invasive mammary carcinoma, 2  separate foci, DCIS, negative sentinel lymph node biopsy 0/1, grade 3, or margins negative for invasive carcinoma.  ER -, PR + [1-10%], HER2 +  pT1b(m) pN0   Patient reports having some chest  discomfort today.  Intermittent dizziness which per patient is chronic for her due to the vertigo.  No fever or chills.  Review of Systems  Constitutional:  Positive for fatigue. Negative for appetite change, chills, fever and unexpected weight change.       Patient sits in the wheelchair  HENT:   Negative for hearing loss and voice change.   Eyes:  Negative for eye problems.  Respiratory:  Negative for chest tightness and cough.   Cardiovascular:  Negative for chest pain.       Chest discomfort  Gastrointestinal:  Negative for abdominal distention, abdominal pain and blood in stool.  Endocrine: Negative for hot flashes.  Genitourinary:  Negative for difficulty urinating and frequency.   Musculoskeletal:  Negative for arthralgias.  Skin:  Negative for itching and rash.  Neurological:  Positive for dizziness. Negative for extremity weakness.  Hematological:  Negative for adenopathy.  Psychiatric/Behavioral:  Negative for confusion.    MEDICAL HISTORY:  Past Medical History:  Diagnosis Date   Achilles tendinitis    Acute medial meniscal tear    Allergy    Anxiety    Atrial fibrillation ([place])    a. new onset 07/2016; b. CHADS2VASc --> 4 (HTN, female, age, CHF); c. eliquis   CHF (congestive heart failure) (HCC)    Chronic anticoagulation    Apixaban   Chronic lower back pain    Colon polyp    Complication of anesthesia    Emergence delirium; hypoxia   COPD (chronic obstructive pulmonary disease) ([place])    Cystic breast    Depression    Endometriosis    Fibrocystic breast disease    GERD (gastroesophageal reflux disease)    History of stress test    a. 07/2016 MV: EF 55-65%. Small, mild defect  in mid anteroseptal, apical septal, and apical locations. No ischemia-->low risk.   Hyperlipidemia    Hypertension    Insomnia    Lipoma    Malignant neoplasm of upper-outer quadrant of right female breast (HCC)    Stage 1A invasive mammary carcinoma (cT1b or T1c N0); ER (-), PR (+),  HER2/neu (+)   Migraine    Mild Mitral regurgitation    a. 07/2016 Echo: EF 55-65%, no rwma, mild MR. Mildly dil LA. Nl RV fx.    OSA (obstructive sleep apnea)    non-compliant with nocturnal PAP therapy   Osteoarthritis    left knee   Osteopenia    Peptic ulcer disease    a. H. pylori   Pneumonia    RLS (restless legs syndrome)    Tendonitis    Thyroid disease    Vitamin D deficiency     SURGICAL HISTORY: Past Surgical History:  Procedure Laterality Date   ABDOMINAL HYSTERECTOMY     ovaries left   ACHILLES TENDON SURGERY     2012,01/2017   BACK SURGERY     fusion lumbar   BREAST BIOPSY Right 01/15/2021   Affirm biopsy-"X" clip-INVASIVE MAMMARY CARCINOMA   BREAST BIOPSY Right 01/15/2021   u/s bx-"venus" clip INVASIVE MAMMARY CARCINOMA   BREAST BIOPSY     BREAST LUMPECTOMY,RADIO FREQ LOCALIZER,AXILLARY SENTINEL LYMPH NODE BIOPSY Right 02/17/2021   Procedure: BREAST LUMPECTOMY,RADIO FREQ LOCALIZER,AXILLARY SENTINEL LYMPH NODE BIOPSY;  Surgeon: Ronny Bacon, MD;  Location: [facility];  Service: General;  Laterality: Right;   CARDIOVERSION N/A 09/07/2016   Procedure: CARDIOVERSION;  Surgeon: Minna Merritts, MD;  Location: [facility];  Service: Cardiovascular;  Laterality: N/A;   CARDIOVERSION N/A 10/19/2016   Procedure: CARDIOVERSION;  Surgeon: Minna Merritts, MD;  Location: [facility];  Service: Cardiovascular;  Laterality: N/A;   CARDIOVERSION N/A 12/14/2016   Procedure: CARDIOVERSION;  Surgeon: Minna Merritts, MD;  Location: [facility];  Service: Cardiovascular;  Laterality: N/A;   COLONOSCOPY     COLONOSCOPY WITH PROPOFOL N/A 05/13/2020   Procedure: COLONOSCOPY WITH PROPOFOL;  Surgeon: Toledo, Benay Pike, MD;  Location: [facility];  Service: Gastroenterology;  Laterality: N/A;  ELIQUIS   ESOPHAGOGASTRODUODENOSCOPY     ESOPHAGOGASTRODUODENOSCOPY (EGD) WITH PROPOFOL N/A 05/13/2020   Procedure: ESOPHAGOGASTRODUODENOSCOPY (EGD) WITH PROPOFOL;  Surgeon: Toledo, Benay Pike,  MD;  Location: [facility];  Service: Gastroenterology;  Laterality: N/A;   FOOT SURGERY Left    tendon   JOINT REPLACEMENT     THYROIDECTOMY     thyroid lobectomy secondary to a benign nodule   TOTAL HIP ARTHROPLASTY Right 08/21/2018   Procedure: TOTAL HIP ARTHROPLASTY ANTERIOR APPROACH-RIGHT CELL SAVER REQUESTED;  Surgeon: [name], MD;  Location: [facility];  Service: Orthopedics;  Laterality: Right;    SOCIAL HISTORY: Social History   Socioeconomic History   Marital status: Married    Spouse name: jeffrey   Number of children: 1   Years of education: Not on file   Highest education level: High school graduate  Occupational History    Comment: disabled  Tobacco Use   Smoking status: Former    Packs/day: 0.25    Years: 10.00    Pack years: 2.50    Types: Cigarettes    Quit date: 2005    Years since quitting: 17.6   Smokeless tobacco: Never   Tobacco comments:    smoked for 10 years, 1 pack every 2-3 days  Vaping Use   Vaping Use: Never used  Substance and Sexual Activity   Alcohol use: Not Currently    Comment: rarely   Drug use: No   Sexual activity: Not Currently  Other Topics Concern   Not on file  Social History Narrative   Moved to [place] from [place], originally from [place].    1 cup of caffeine daily   Right handed          Social Determinants of Health   Financial Resource Strain: Low Risk    Difficulty of Paying Living Expenses: Not very hard  Food Insecurity: No Food Insecurity   Worried About Charity fundraiser in the Last Year: Never true   Ran Out of Food in the Last Year: Never true  Transportation Needs: No Transportation Needs   Lack of Transportation (Medical): No   Lack of Transportation (Non-Medical): No  Physical Activity: Unknown   Days of Exercise per Week: 0 days   Minutes of Exercise per Session: Not on file  Stress: Stress Concern Present   Feeling of Stress : Rather much  Social Connections:  Moderately Isolated   Frequency of Communication with Friends and Family: Never   Frequency of Social Gatherings with Friends and Family: Once a week   Attends Religious Services: More than 4 times per year   Active Member of [employer] or Organizations: No   Attends Archivist Meetings: Never   Marital Status: Married  Human resources officer Violence: Not At Risk   Fear of Current or Ex-Partner: No  Emotionally Abused: No   Physically Abused: No   Sexually Abused: No    FAMILY HISTORY: Family History  Problem Relation Age of Onset   Cancer Sister 30       ovarian   Depression Sister    Cancer Brother        CLL   Cancer Father 49       colon   Heart disease Father    Heart attack Father 45       died from MI   Hyperlipidemia Father    Hypertension Father    Cancer Mother        leukemia   Breast cancer Paternal Aunt        79's   Migraines Neg Hx     ALLERGIES:  is allergic to prednisone, amiodarone, hydrocodone-acetaminophen, oxycodone, tapentadol, tramadol, adhesive [tape], and latex.  MEDICATIONS:  Current Outpatient Medications  Medication Sig Dispense Refill   acetaminophen (TYLENOL) 500 MG tablet Take 500 mg by mouth every 6 (six) hours as needed. Taking as needed     albuterol (VENTOLIN HFA) 108 (90 Base) MCG/ACT inhaler INHALE 2 PUFFS INTO THE LUNGS EVERY 8 (EIGHT) HOURS AS NEEDED FOR WHEEZING OR SHORTNESS OF BREATH. 6.7 g 0   ALPRAZolam (XANAX) 0.25 MG tablet TAKE 1 TABLET BY MOUTH DAILY AS NEEDED FOR ANXIETY OR SLEEP. 30 tablet 2   azelastine (ASTELIN) 0.1 % nasal spray Place into both nostrils as needed for rhinitis. Use in each nostril as directed     B Complex Vitamins (VITAMIN B COMPLEX PO) Take 1 tablet by mouth daily.      bisoprolol (ZEBETA) 5 MG tablet TAKE 1 TABLET BY MOUTH IN THE MORNING AND MAY TAKE AN EXTRA IN THE EVENING IF NEEDED FOR HEART RATE (Patient taking differently: Take 5 mg by mouth 2 (two) times daily.) 180 tablet 0   busPIRone  (BUSPAR) 5 MG tablet TAKE 1 TABLET BY MOUTH THREE TIMES A DAY AS NEEDED 60 tablet 1   calcium gluconate 500 MG tablet Take 500 tablets by mouth daily.     calcium-vitamin D (OSCAL WITH D) 500-200 MG-UNIT tablet Take 1 tablet by mouth daily with breakfast.     Cholecalciferol (VITAMIN D3) 5000 units CAPS Take 5,000 Units by mouth daily.     DULoxetine (CYMBALTA) 30 MG capsule Take two tablets by mouth for a total of 60 MG daily. 180 capsule 2   ELIQUIS 5 MG TABS tablet TAKE 1 TABLET BY MOUTH TWICE A DAY 180 tablet 1   esomeprazole (NEXIUM) 20 MG capsule Take 20 mg by mouth as needed.     furosemide (LASIX) 20 MG tablet TAKE 1 TABLET BY MOUTH TWICE A DAY 180 tablet 0   hydrochlorothiazide (MICROZIDE) 12.5 MG capsule TAKE 1 CAPSULE BY MOUTH EVERY DAY 90 capsule 2   ibuprofen (ADVIL) 800 MG tablet Take 1 tablet (800 mg total) by mouth every 8 (eight) hours as needed. 30 tablet 0   losartan (COZAAR) 100 MG tablet TAKE 1 TABLET BY MOUTH DAILY. PLEASE CALL OFFICE TO SCHEDULE AN APPOINTMENT FOR FURTHER REFILLS 90 tablet 0   Multiple Vitamin (MULTIVITAMIN) tablet Take 1 tablet by mouth daily.     ranolazine (RANEXA) 500 MG 12 hr tablet TAKE 1 TABLET BY MOUTH TWICE A DAY 180 tablet 3   rOPINIRole (REQUIP) 1 MG tablet TAKE 1 TABLET BY MOUTH AT BEDTIME. 90 tablet 1   rosuvastatin (CRESTOR) 20 MG tablet TAKE 1 TABLET BY MOUTH EVERY DAY 90  tablet 0   potassium chloride SA (KLOR-CON) 20 MEQ tablet Take 1 tablet (20 mEq total) by mouth 2 (two) times daily. 120 tablet 2   rizatriptan (MAXALT) 10 MG tablet TAKE 1 TABLET (10 MG TOTAL) BY MOUTH AS NEEDED FOR MIGRAINE. MAY REPEAT IN 2 HOURS IF NEEDED (Patient not taking: No sig reported) 10 tablet 0   No current facility-administered medications for this visit.     PHYSICAL EXAMINATION: ECOG PERFORMANCE STATUS: 2 - Symptomatic, <50% confined to bed Vitals:   03/03/21 1021  BP: 134/78  Pulse: (!) 55  Resp: 17  Temp: (!) 96.3 F (35.7 C)  SpO2: 97%   Filed  Weights   03/03/21 1021  Weight: 286 lb (129.7 kg)    Physical Exam Constitutional:      General: She is not in acute distress.    Appearance: She is obese.  HENT:     Head: Normocephalic and atraumatic.  Eyes:     General: No scleral icterus. Cardiovascular:     Rate and Rhythm: Normal rate and regular rhythm.     Heart sounds: Normal heart sounds.  Pulmonary:     Effort: Pulmonary effort is normal. No respiratory distress.     Breath sounds: No wheezing.  Abdominal:     General: Bowel sounds are normal. There is no distension.     Palpations: Abdomen is soft.  Musculoskeletal:        General: No deformity. Normal range of motion.     Cervical back: Normal range of motion and neck supple.  Skin:    General: Skin is warm and dry.     Findings: No erythema or rash.  Neurological:     Mental Status: She is alert and oriented to person, place, and time. Mental status is at baseline.     Cranial Nerves: No cranial nerve deficit.     Coordination: Coordination normal.  Psychiatric:        Mood and Affect: Mood normal.   Breast exam was performed in seated position while patient sits in the wheelchair. Patient is status post right lumpectomy and sentinel lymph node biopsy. Marland Kitchen LABORATORY DATA:  I have reviewed the data as listed Lab Results  Component Value Date   WBC 6.3 01/19/2021   HGB 13.6 02/17/2021   HCT 40.0 02/17/2021   MCV 90.9 01/19/2021   PLT 183 01/19/2021   Recent Labs    03/25/20 0802 09/22/20 0950 01/19/21 1423 02/17/21 0853 02/26/21 0912  NA 138 141 138 140 140  K 4.4 4.0 3.5 3.1* 3.4*  CL 100 100 98 100 100  CO2 _0 --  28  GLUCOSE 82 98 91 101* 89  BUN _1 CREATININE 0.83 0.89 0.73 1.30* 1.06  CALCIUM 9.0 9.2 8.8*  --  9.0  GFRNONAA  --   --  >60  --   --   PROT 6.9  --  8.1  --   --   ALBUMIN 4.1  --  4.1  --   --   AST 20  --  21  --   --   ALT 22  --  17  --   --   ALKPHOS 79  --  75  --   --   BILITOT 0.7  --  0.8   --   --     Iron/TIBC/Ferritin/ %Sat    Component Value Date/Time   IRON 109 05/21/2020 1059   FERRITIN  34.5 05/21/2020 1059   IRONPCTSAT 25.3 05/21/2020 1059      RADIOGRAPHIC STUDIES: I have personally reviewed the radiological images as listed and agreed with the findings in the report. NM Sentinel Node Inj-No Rpt (Breast)  Result Date: 02/17/2021 Sulfur Colloid was injected by the Nuclear Medicine Technologist for sentinel lymph node localization.   MM Breast Surgical Specimen  Result Date: 02/17/2021 CLINICAL DATA:  Specimen radiograph status post right breast lumpectomy. EXAM: SPECIMEN RADIOGRAPH OF THE RIGHT BREAST COMPARISON:  Previous exam(s). FINDINGS: Status post excision of the right breast. The radiofrequency tag and 2 biopsy marker clips are present, completely intact, and were marked for pathology. IMPRESSION: Specimen radiograph of the right breast. Electronically Signed   By: Ammie Ferrier M.D.   On: 02/17/2021 10:46  MM RT RADIO FREQUENCY TAG LOC MAMMO GUIDE  Result Date: 02/03/2021 CLINICAL DATA:  Here for radiofrequency seed localization for invasive mammary carcinoma in the right breast. This was demonstrated at two sites of biopsy in the right breast marked by venus and X shaped biopsy marking clips. EXAM: MAMMOGRAPHIC GUIDED RADIOFREQUENCY DEVICE LOCALIZATION OF THE RIGHT BREAST COMPARISON:  Previous exam(s) FINDINGS: Patient presents for radiofrequency device localization prior to breast conserving surgery. I met with the patient and we discussed the procedure of radiofrequency device localization including benefits and alternatives. We discussed the high likelihood of a successful procedure. We discussed the risks of the procedure including infection, bleeding, tissue injury and further surgery. Informed, written consent was given. The usual time-out protocol was performed immediately prior to the procedure. Using mammographic guidance, sterile technique, 1%  lidocaine as local anesthesia, a radiofrequency tag was used to localize the midpoint between the two sites of biopsy proven malignancy in the upper outer right breast using a lateral approach. The follow-up mammogram images confirm that the RF device is in the expected location and are marked for the patient's surgeon. The patient tolerated the procedure well and was released from the Breast Center. IMPRESSION: Radiofrequency device localization of the RIGHT breast. No apparent complications. Electronically Signed   By: Zerita Boers M.D.   On: 02/03/2021 16:32     ASSESSMENT & PLAN:  1. HER2-positive carcinoma of breast ([place])   2. Family history of ovarian cancer   3. Malignant neoplasm of upper-outer quadrant of right breast in female, estrogen receptor negative ([place])   4. Goals of care, counseling/discussion   Cancer Staging Malignant neoplasm of upper-outer quadrant of right female breast [facility]) Staging form: Breast, AJCC 8th Edition - Pathologic stage from 03/03/2021: Stage IA (pT1b, pN0, cM0, G3, ER-, PR+, HER2+) - Signed by [name], MD on 03/03/2021  #Right breast invasive carcinoma pT1b pN0 ER negative, PR weakly HER2 positive positive, HER 2 positive.  2 foci adjacent to each other. Stage 1 ER-, HER2 + disease,  Pathology report was independent reviewed by me and discussed with patient.  Standard recommendation includes adjuvant chemotherapy with weekly Taxol plus trastuzumab for 12 weeks followed by transtuzumab maintenance q21d for 13 cycles.  Discussed about the rationale and potential side effects. Chemotherapy education was provided.  We had discussed the composition of chemotherapy regimen, length of chemo cycle, duration of treatment and the time to assess response to treatment.  I explained to the patient the risks and benefits of chemotherapy including all but not limited to hair loss, mouth sore, nausea, vomiting, diarrhea, low blood counts, bleeding, neuropathy, heart failure,  and risk of life threatening infection and even death, secondary malignancy etc.  Patient voices understanding and willing to proceed chemotherapy.   Patient has history of CHF,Last echocardiogram was done on 07/27/2016. -LVEF 55 to 60%-repeat echocardiogram. Would like patient to do follow-up appointment with her cardiologist Dr. [name] for a prechemotherapy evaluation and clearance.   # Chemotherapy education; We will asked Dr. [name] for [facility]- port placement once we are sure patient is able to proceed with chemotherapy Antiemetics-Zofran and Compazine; EMLA cream sent to pharmacy  Supportive care measures are necessary for patient well-being and will be provided as necessary. We spent sufficient time to discuss many aspect of care, questions were answered to patient's satisfaction.  Strong family history of cancer, Discussed again about genetic testing. patient adamantly declined. Orders Placed This Encounter  Procedures   ECHOCARDIOGRAM COMPLETE    Standing Status:   Future    Standing Expiration Date:   03/03/2022    Order Specific Question:   Where should this test be performed    Answer:   [facility]    Order Specific Question:   Please indicate who you request to read the nuc med / echo results.    Answer:   [facility]    Order Specific Question:   Perflutren DEFINITY (image enhancing agent) should be administered unless hypersensitivity or allergy exist    Answer:   Administer Perflutren    Order Specific Question:   Reason for exam-Echo    Answer:   Chemo  Z09    All questions were answered. The patient knows to call the clinic with any problems questions or concerns.  cc [name], FNP  Follow-up to be determined.  Pending cardiology clearance   [name], MD, PhD Hematology Oncology [facility] Pager- [phone] 03/03/2021

## 2021-03-03 NOTE — Progress Notes (Signed)
Patient here for oncology follow-up appointment,  concerns of constipation, dizziness, weakness

## 2021-03-03 NOTE — Progress Notes (Signed)
START ON PATHWAY REGIMEN - Breast     Cycle 1: A cycle is 7 days:     Trastuzumab-xxxx      Paclitaxel    Cycles 2 through 12: A cycle is every 7 days:     Trastuzumab-xxxx      Paclitaxel    Cycles 13 through 25: A cycle is every 21 days:     Trastuzumab-xxxx   **Always confirm dose/schedule in your pharmacy ordering system**  Patient Characteristics: Postoperative without Neoadjuvant Therapy (Pathologic Staging), Invasive Disease, Adjuvant Therapy, HER2 Positive, ER Negative/Unknown, Node Negative, pT1b, pN0/N54m, Chemotherapy Indicated Therapeutic Status: Postoperative without Neoadjuvant Therapy (Pathologic Staging) AJCC Grade: G3 AJCC N Category: pN0 AJCC M Category: cM0 ER Status: Negative (-) AJCC 8 Stage Grouping: IA HER2 Status: Positive (+) Oncotype Dx Recurrence Score: Not Appropriate AJCC T Category: pT1b PR Status: Positive (+) Adjuvant Therapy Status: No Adjuvant Therapy Received Yet or Changing Initial Adjuvant Regimen due to Tolerance Intent of Therapy: Curative Intent, Discussed with Patient

## 2021-03-04 ENCOUNTER — Telehealth: Payer: Self-pay

## 2021-03-04 DIAGNOSIS — E876 Hypokalemia: Secondary | ICD-10-CM

## 2021-03-04 NOTE — Telephone Encounter (Signed)
Lab ordered for bmp for future labs

## 2021-03-08 ENCOUNTER — Other Ambulatory Visit: Payer: Self-pay

## 2021-03-08 ENCOUNTER — Inpatient Hospital Stay: Payer: Medicare Other

## 2021-03-08 ENCOUNTER — Ambulatory Visit
Admission: RE | Admit: 2021-03-08 | Discharge: 2021-03-08 | Disposition: A | Discharge status: Home / Self Care | Payer: Medicare Other | Source: Ambulatory Visit | Attending: Oncology | Admitting: Oncology

## 2021-03-08 DIAGNOSIS — C50919 Malignant neoplasm of unspecified site of unspecified female breast: Secondary | ICD-10-CM | POA: Insufficient documentation

## 2021-03-08 DIAGNOSIS — Z0189 Encounter for other specified special examinations: Secondary | ICD-10-CM | POA: Diagnosis not present

## 2021-03-08 DIAGNOSIS — I088 Other rheumatic multiple valve diseases: Secondary | ICD-10-CM | POA: Diagnosis not present

## 2021-03-08 DIAGNOSIS — I10 Essential (primary) hypertension: Secondary | ICD-10-CM | POA: Diagnosis not present

## 2021-03-08 LAB — ECHOCARDIOGRAM COMPLETE
AR max vel: 1.85 cm2
AV Area VTI: 1.88 cm2
AV Area mean vel: 1.88 cm2
AV Mean grad: 4.7 mmHg
AV Peak grad: 8.4 mmHg
Ao pk vel: 1.45 m/s
Area-P 1/2: 2.63 cm2
S' Lateral: 2.61 cm

## 2021-03-08 NOTE — Progress Notes (Signed)
*  PRELIMINARY RESULTS* Echocardiogram 2D Echocardiogram has been performed.  Sherrie Sport 03/08/2021, 9:44 AM

## 2021-03-10 ENCOUNTER — Other Ambulatory Visit: Payer: Self-pay | Admitting: Cardiovascular Disease

## 2021-03-10 DIAGNOSIS — I4819 Other persistent atrial fibrillation: Secondary | ICD-10-CM

## 2021-03-11 ENCOUNTER — Inpatient Hospital Stay: Payer: Medicare Other | Attending: Oncology

## 2021-03-11 DIAGNOSIS — I10 Essential (primary) hypertension: Secondary | ICD-10-CM | POA: Insufficient documentation

## 2021-03-11 DIAGNOSIS — R112 Nausea with vomiting, unspecified: Secondary | ICD-10-CM | POA: Insufficient documentation

## 2021-03-11 DIAGNOSIS — M5412 Radiculopathy, cervical region: Secondary | ICD-10-CM | POA: Insufficient documentation

## 2021-03-11 DIAGNOSIS — Z79899 Other long term (current) drug therapy: Secondary | ICD-10-CM | POA: Insufficient documentation

## 2021-03-11 DIAGNOSIS — Z5111 Encounter for antineoplastic chemotherapy: Secondary | ICD-10-CM | POA: Insufficient documentation

## 2021-03-11 DIAGNOSIS — M1990 Unspecified osteoarthritis, unspecified site: Secondary | ICD-10-CM | POA: Insufficient documentation

## 2021-03-11 DIAGNOSIS — G629 Polyneuropathy, unspecified: Secondary | ICD-10-CM | POA: Insufficient documentation

## 2021-03-11 DIAGNOSIS — Z803 Family history of malignant neoplasm of breast: Secondary | ICD-10-CM | POA: Insufficient documentation

## 2021-03-11 DIAGNOSIS — I110 Hypertensive heart disease with heart failure: Secondary | ICD-10-CM | POA: Insufficient documentation

## 2021-03-11 DIAGNOSIS — F329 Major depressive disorder, single episode, unspecified: Secondary | ICD-10-CM | POA: Insufficient documentation

## 2021-03-11 DIAGNOSIS — Z7901 Long term (current) use of anticoagulants: Secondary | ICD-10-CM | POA: Insufficient documentation

## 2021-03-11 DIAGNOSIS — R634 Abnormal weight loss: Secondary | ICD-10-CM | POA: Insufficient documentation

## 2021-03-11 DIAGNOSIS — G2581 Restless legs syndrome: Secondary | ICD-10-CM | POA: Insufficient documentation

## 2021-03-11 DIAGNOSIS — Z806 Family history of leukemia: Secondary | ICD-10-CM | POA: Insufficient documentation

## 2021-03-11 DIAGNOSIS — I4891 Unspecified atrial fibrillation: Secondary | ICD-10-CM | POA: Insufficient documentation

## 2021-03-11 DIAGNOSIS — I509 Heart failure, unspecified: Secondary | ICD-10-CM | POA: Insufficient documentation

## 2021-03-11 DIAGNOSIS — E785 Hyperlipidemia, unspecified: Secondary | ICD-10-CM | POA: Insufficient documentation

## 2021-03-11 DIAGNOSIS — G4730 Sleep apnea, unspecified: Secondary | ICD-10-CM | POA: Insufficient documentation

## 2021-03-11 DIAGNOSIS — J449 Chronic obstructive pulmonary disease, unspecified: Secondary | ICD-10-CM | POA: Insufficient documentation

## 2021-03-11 DIAGNOSIS — Z86010 Personal history of colonic polyps: Secondary | ICD-10-CM | POA: Insufficient documentation

## 2021-03-11 DIAGNOSIS — C50411 Malignant neoplasm of upper-outer quadrant of right female breast: Secondary | ICD-10-CM | POA: Insufficient documentation

## 2021-03-11 DIAGNOSIS — T451X5A Adverse effect of antineoplastic and immunosuppressive drugs, initial encounter: Secondary | ICD-10-CM | POA: Insufficient documentation

## 2021-03-11 DIAGNOSIS — E559 Vitamin D deficiency, unspecified: Secondary | ICD-10-CM | POA: Insufficient documentation

## 2021-03-11 DIAGNOSIS — K219 Gastro-esophageal reflux disease without esophagitis: Secondary | ICD-10-CM | POA: Insufficient documentation

## 2021-03-11 DIAGNOSIS — Z171 Estrogen receptor negative status [ER-]: Secondary | ICD-10-CM | POA: Insufficient documentation

## 2021-03-12 ENCOUNTER — Other Ambulatory Visit: Payer: Self-pay

## 2021-03-12 ENCOUNTER — Other Ambulatory Visit (INDEPENDENT_AMBULATORY_CARE_PROVIDER_SITE_OTHER): Payer: Medicare Other

## 2021-03-12 DIAGNOSIS — E876 Hypokalemia: Secondary | ICD-10-CM

## 2021-03-12 LAB — BASIC METABOLIC PANEL
BUN: 16 mg/dL (ref 6–23)
CO2: 28 mEq/L (ref 19–32)
Calcium: 8.9 mg/dL (ref 8.4–10.5)
Chloride: 101 mEq/L (ref 96–112)
Creatinine, Ser: 1.04 mg/dL (ref 0.40–1.20)
GFR: 55.32 mL/min — ABNORMAL LOW (ref >60.00)
Glucose, Bld: 102 mg/dL — ABNORMAL HIGH (ref 70–99)
Potassium: 3.5 mEq/L (ref 3.5–5.1)
Sodium: 140 mEq/L (ref 135–145)

## 2021-03-16 ENCOUNTER — Telehealth: Payer: Self-pay

## 2021-03-16 NOTE — Telephone Encounter (Signed)
-----   Message from [name], MD sent at 03/15/2021  3:13 PM EDT ----- Please schedule her to do Lab MD Taxol and transtuzumab during week of 9/14- [ after 9/13], or week of 9/19 Thanks.

## 2021-03-16 NOTE — Telephone Encounter (Signed)
Patient confirmed appointments on 9/14 starting at 8 am, patient is aware that treatment will take 5 hours.

## 2021-03-16 NOTE — Telephone Encounter (Signed)
Please schedule as MD recommends. Tx will  be new. Please notify pt of appt.

## 2021-03-18 ENCOUNTER — Telehealth: Payer: Self-pay | Admitting: Surgery

## 2021-03-18 ENCOUNTER — Ambulatory Visit: Payer: Self-pay | Admitting: Surgery

## 2021-03-18 DIAGNOSIS — C50411 Malignant neoplasm of upper-outer quadrant of right female breast: Secondary | ICD-10-CM

## 2021-03-18 DIAGNOSIS — Z171 Estrogen receptor negative status [ER-]: Secondary | ICD-10-CM

## 2021-03-18 NOTE — Telephone Encounter (Signed)
Cancer center called said this patient is needing to have a port placement, and the patient is starting her chemo on Wednesday. Please advise.

## 2021-03-18 NOTE — Telephone Encounter (Signed)
Ok, so I was able to add case on for Tuesday 03/23/21 at 12:00.  Crystal in the OR was able to make it work.  Will call patient in the morning to inform.   Thank you.

## 2021-03-19 ENCOUNTER — Telehealth: Payer: Self-pay | Admitting: Surgery

## 2021-03-19 ENCOUNTER — Other Ambulatory Visit: Payer: Self-pay | Admitting: Oncology

## 2021-03-19 DIAGNOSIS — C50919 Malignant neoplasm of unspecified site of unspecified female breast: Secondary | ICD-10-CM

## 2021-03-19 MED ORDER — PROCHLORPERAZINE MALEATE 10 MG PO TABS: 10.0000 mg | ORAL_TABLET | Freq: Four times a day (QID) | ORAL | 1 refills | Status: DC | PRN

## 2021-03-19 MED ORDER — LIDOCAINE-PRILOCAINE 2.5-2.5 % EX CREA
TOPICAL_CREAM | CUTANEOUS | 3 refills | Status: DC
Start: 2021-03-19 — End: 2022-03-02

## 2021-03-19 NOTE — Telephone Encounter (Signed)
Patient has been advised of Pre-Admission date/time, COVID Testing date and Surgery date.  Surgery Date: 03/23/21 Preadmission Testing Date: 03/22/21 (phone 8a-1p) Covid Testing Date: Not needed.     Patient has been made aware to call [phone], between 1-3:00pm the day before surgery, to find out what time to arrive for surgery.

## 2021-03-22 ENCOUNTER — Other Ambulatory Visit: Payer: Self-pay

## 2021-03-22 ENCOUNTER — Encounter
Admission: RE | Admit: 2021-03-22 | Discharge: 2021-03-22 | Disposition: A | Discharge status: Home / Self Care | Payer: Medicare Other | Source: Ambulatory Visit | Attending: Surgery | Admitting: Surgery

## 2021-03-22 NOTE — Patient Instructions (Signed)
Your procedure is scheduled on: 03/23/21 Report to [facility]. To find out your arrival time please call [phone] between 1PM - 3PM on 03/22/21.  Remember: Instructions that are not followed completely may result in serious medical risk, up to and including death, or upon the discretion of your surgeon and anesthesiologist your surgery may need to be rescheduled.     _X__ 1. Do not eat food after midnight the night before your procedure.                 No gum chewing or hard candies.   __X__2.  On the morning of surgery brush your teeth with toothpaste and water, you                 may rinse your mouth with mouthwash if you wish.  Do not swallow any              toothpaste of mouthwash.     _X__ 3.  No Alcohol for 24 hours before or after surgery.   _X__ 4.  Do Not Smoke or use e-cigarettes For 24 Hours Prior to Your Surgery.                 Do not use any chewable tobacco products for at least 6 hours prior to                 surgery.  ____  5.  Bring all medications with you on the day of surgery if instructed.   __X__  6.  Notify your doctor if there is any change in your medical condition      (cold, fever, infections).     Do not wear jewelry, make-up, hairpins, clips or nail polish. Do not wear lotions, powders, or perfumes.  Do not shave body hair 48 hours prior to surgery. Men may shave face and neck. Do not bring valuables to the hospital.    [facility] is not responsible for any belongings or valuables.  Contacts, dentures/partials or body piercings may not be worn into surgery. Bring a case for your contacts, glasses or hearing aids, a denture cup will be supplied. Leave your suitcase in the car. After surgery it may be brought to your room. For patients admitted to the hospital, discharge time is determined by your treatment team.   Patients discharged the day of surgery will not be allowed to drive  home.   Please read over the following fact sheets that you were given:     __X__ Take these medicines the morning of surgery with A SIP OF WATER:    1. bisoprolol (ZEBETA) 5 MG tablet  2. busPIRone (BUSPAR) 5 MG tablet if needed  3. DULoxetine (CYMBALTA) 30 MG capsule  4. esomeprazole (NEXIUM) 20 MG capsule  5. ranolazine (RANEXA) 500 MG 12 hr tablet  6. rosuvastatin (CRESTOR) 20 MG tablet  ____ Fleet Enema (as directed)   __X__ Use CHG Soap/SAGE wipes as directed  __X__ Use inhalers on the day of surgery  ____ Stop metformin/Janumet/Farxiga 2 days prior to surgery    ____ Take 1/2 of usual insulin dose the night before surgery. No insulin the morning          of surgery.   ____ Stop Blood Thinners Coumadin/Plavix/Xarelto/Pleta/Pradaxa/Eliquis/Effient/Aspirin  on   Or contact your Surgeon, Cardiologist or Medical Doctor regarding  ability to stop your blood thinners  __X__ Stop Anti-inflammatories 7 days before  surgery such as Advil, Ibuprofen, Motrin,  BC or Goodies Powder, Naprosyn, Naproxen, Aleve, Aspirin    __X__ Stop all herbal supplements, fish oil or vitamin E until after surgery.    ____ Bring C-Pap to the hospital.    HOLD ELIQUIS TODAY 03/22/21

## 2021-03-23 ENCOUNTER — Encounter
Admission: RE | Disposition: A | Discharge status: Home / Self Care | Payer: Self-pay | Source: Home / Self Care | Attending: Surgery

## 2021-03-23 ENCOUNTER — Ambulatory Visit: Admit: 2021-03-23 | Payer: Medicare Other | Admitting: Vascular Surgery

## 2021-03-23 ENCOUNTER — Encounter: Payer: Self-pay | Admitting: Physician Assistant

## 2021-03-23 ENCOUNTER — Ambulatory Visit: Payer: Medicare Other | Admitting: Certified Registered Nurse Anesthetist

## 2021-03-23 ENCOUNTER — Other Ambulatory Visit (INDEPENDENT_AMBULATORY_CARE_PROVIDER_SITE_OTHER): Payer: Self-pay | Admitting: Vascular Surgery

## 2021-03-23 ENCOUNTER — Ambulatory Visit: Payer: Medicare Other

## 2021-03-23 ENCOUNTER — Ambulatory Visit: Payer: Medicare Other | Admitting: Physician Assistant

## 2021-03-23 ENCOUNTER — Encounter: Payer: Self-pay | Admitting: Surgery

## 2021-03-23 ENCOUNTER — Ambulatory Visit
Admission: RE | Admit: 2021-03-23 | Discharge: 2021-03-23 | Disposition: A | Discharge status: Home / Self Care | Payer: Medicare Other | Attending: Surgery | Admitting: Surgery

## 2021-03-23 VITALS — BP 120/70 | HR 50 | Ht 65.0 in | Wt 285.1 lb

## 2021-03-23 DIAGNOSIS — K760 Fatty (change of) liver, not elsewhere classified: Secondary | ICD-10-CM | POA: Diagnosis not present

## 2021-03-23 DIAGNOSIS — J449 Chronic obstructive pulmonary disease, unspecified: Secondary | ICD-10-CM | POA: Diagnosis not present

## 2021-03-23 DIAGNOSIS — G4733 Obstructive sleep apnea (adult) (pediatric): Secondary | ICD-10-CM | POA: Diagnosis not present

## 2021-03-23 DIAGNOSIS — I517 Cardiomegaly: Secondary | ICD-10-CM | POA: Diagnosis not present

## 2021-03-23 DIAGNOSIS — I10 Essential (primary) hypertension: Secondary | ICD-10-CM | POA: Diagnosis not present

## 2021-03-23 DIAGNOSIS — C50411 Malignant neoplasm of upper-outer quadrant of right female breast: Secondary | ICD-10-CM

## 2021-03-23 DIAGNOSIS — K2970 Gastritis, unspecified, without bleeding: Secondary | ICD-10-CM

## 2021-03-23 DIAGNOSIS — Z6841 Body Mass Index (BMI) 40.0 and over, adult: Secondary | ICD-10-CM | POA: Insufficient documentation

## 2021-03-23 DIAGNOSIS — C50919 Malignant neoplasm of unspecified site of unspecified female breast: Secondary | ICD-10-CM

## 2021-03-23 DIAGNOSIS — I480 Paroxysmal atrial fibrillation: Secondary | ICD-10-CM | POA: Diagnosis not present

## 2021-03-23 DIAGNOSIS — Z87891 Personal history of nicotine dependence: Secondary | ICD-10-CM | POA: Diagnosis not present

## 2021-03-23 DIAGNOSIS — Z7901 Long term (current) use of anticoagulants: Secondary | ICD-10-CM | POA: Diagnosis not present

## 2021-03-23 DIAGNOSIS — I4891 Unspecified atrial fibrillation: Secondary | ICD-10-CM | POA: Insufficient documentation

## 2021-03-23 DIAGNOSIS — Z888 Allergy status to other drugs, medicaments and biological substances status: Secondary | ICD-10-CM | POA: Diagnosis not present

## 2021-03-23 DIAGNOSIS — Z171 Estrogen receptor negative status [ER-]: Secondary | ICD-10-CM | POA: Diagnosis not present

## 2021-03-23 DIAGNOSIS — K219 Gastro-esophageal reflux disease without esophagitis: Secondary | ICD-10-CM | POA: Diagnosis not present

## 2021-03-23 DIAGNOSIS — Z885 Allergy status to narcotic agent status: Secondary | ICD-10-CM | POA: Insufficient documentation

## 2021-03-23 DIAGNOSIS — Z91040 Latex allergy status: Secondary | ICD-10-CM | POA: Diagnosis not present

## 2021-03-23 DIAGNOSIS — J939 Pneumothorax, unspecified: Secondary | ICD-10-CM | POA: Diagnosis not present

## 2021-03-23 DIAGNOSIS — E785 Hyperlipidemia, unspecified: Secondary | ICD-10-CM | POA: Diagnosis not present

## 2021-03-23 DIAGNOSIS — E876 Hypokalemia: Secondary | ICD-10-CM

## 2021-03-23 DIAGNOSIS — Z01810 Encounter for preprocedural cardiovascular examination: Secondary | ICD-10-CM | POA: Diagnosis not present

## 2021-03-23 HISTORY — PX: PORTACATH PLACEMENT: SHX2246

## 2021-03-23 HISTORY — PX: PORTA CATH INSERTION: CATH118285

## 2021-03-23 SURGERY — INSERTION, TUNNELED CENTRAL VENOUS DEVICE, WITH PORT
Anesthesia: General

## 2021-03-23 SURGERY — PORTA CATH INSERTION
Anesthesia: Moderate Sedation

## 2021-03-23 MED ORDER — CHLORHEXIDINE GLUCONATE 0.12 % MT SOLN
OROMUCOSAL | Status: AC
  Administered 2021-03-23: 15 mL via OROMUCOSAL
  Filled 2021-03-23: qty 15

## 2021-03-23 MED ORDER — OXYCODONE HCL 5 MG PO TABS: 5.0000 mg | ORAL_TABLET | Freq: Once | ORAL | Status: DC | PRN

## 2021-03-23 MED ORDER — ONDANSETRON HCL 4 MG/2ML IJ SOLN: 4.0000 mg | Freq: Once | INTRAMUSCULAR | Status: DC | PRN

## 2021-03-23 MED ORDER — CHLORHEXIDINE GLUCONATE CLOTH 2 % EX PADS: 6.0000 | MEDICATED_PAD | Freq: Once | CUTANEOUS | Status: DC

## 2021-03-23 MED ORDER — HEPARIN SODIUM (PORCINE) 5000 UNIT/ML IJ SOLN
INTRAMUSCULAR | Status: AC
  Filled 2021-03-23: qty 2

## 2021-03-23 MED ORDER — KETAMINE HCL 50 MG/5ML IJ SOSY
PREFILLED_SYRINGE | INTRAMUSCULAR | Status: AC
  Filled 2021-03-23: qty 5

## 2021-03-23 MED ORDER — OXYCODONE HCL 5 MG/5ML PO SOLN: 5.0000 mg | Freq: Once | ORAL | Status: DC | PRN

## 2021-03-23 MED ORDER — FENTANYL CITRATE (PF) 100 MCG/2ML IJ SOLN
INTRAMUSCULAR | Status: DC | PRN
  Administered 2021-03-23: 50 ug via INTRAVENOUS

## 2021-03-23 MED ORDER — PROPOFOL 500 MG/50ML IV EMUL
INTRAVENOUS | Status: AC
  Filled 2021-03-23: qty 50

## 2021-03-23 MED ORDER — LIDOCAINE HCL (PF) 2 % IJ SOLN
INTRAMUSCULAR | Status: AC
  Filled 2021-03-23: qty 5

## 2021-03-23 MED ORDER — ORAL CARE MOUTH RINSE: 15.0000 mL | Freq: Once | OROMUCOSAL | Status: AC

## 2021-03-23 MED ORDER — GLYCOPYRROLATE 0.2 MG/ML IJ SOLN
INTRAMUSCULAR | Status: DC | PRN
  Administered 2021-03-23: .2 mg via INTRAVENOUS

## 2021-03-23 MED ORDER — KETAMINE HCL 10 MG/ML IJ SOLN
INTRAMUSCULAR | Status: DC | PRN
  Administered 2021-03-23: 20 mg via INTRAVENOUS

## 2021-03-23 MED ORDER — EPHEDRINE SULFATE 50 MG/ML IJ SOLN
INTRAMUSCULAR | Status: DC | PRN
  Administered 2021-03-23: 15 mg via INTRAVENOUS
  Administered 2021-03-23: 10 mg via INTRAVENOUS

## 2021-03-23 MED ORDER — BUPIVACAINE-EPINEPHRINE (PF) 0.25% -1:200000 IJ SOLN
INTRAMUSCULAR | Status: DC | PRN
  Administered 2021-03-23: 10 mL

## 2021-03-23 MED ORDER — FENTANYL CITRATE (PF) 100 MCG/2ML IJ SOLN
INTRAMUSCULAR | Status: AC
  Administered 2021-03-23: 25 ug via INTRAVENOUS
  Filled 2021-03-23: qty 2

## 2021-03-23 MED ORDER — MEPERIDINE HCL 25 MG/ML IJ SOLN: 6.2500 mg | INTRAMUSCULAR | Status: DC | PRN

## 2021-03-23 MED ORDER — CHLORHEXIDINE GLUCONATE 0.12 % MT SOLN: 15.0000 mL | Freq: Once | OROMUCOSAL | Status: AC

## 2021-03-23 MED ORDER — FENTANYL CITRATE (PF) 100 MCG/2ML IJ SOLN
25.0000 ug | INTRAMUSCULAR | Status: DC | PRN
  Administered 2021-03-23: 25 ug via INTRAVENOUS

## 2021-03-23 MED ORDER — PROPOFOL 10 MG/ML IV BOLUS
INTRAVENOUS | Status: DC | PRN
  Administered 2021-03-23: 20 mg via INTRAVENOUS
  Administered 2021-03-23: 30 mg via INTRAVENOUS

## 2021-03-23 MED ORDER — MIDAZOLAM HCL 5 MG/5ML IJ SOLN
INTRAMUSCULAR | Status: AC
  Filled 2021-03-23: qty 5

## 2021-03-23 MED ORDER — MIDAZOLAM HCL 2 MG/2ML IJ SOLN
INTRAMUSCULAR | Status: DC | PRN
  Administered 2021-03-23 (×2): 1 mg via INTRAVENOUS

## 2021-03-23 MED ORDER — DEXMEDETOMIDINE (PRECEDEX) IN NS 20 MCG/5ML (4 MCG/ML) IV SYRINGE
PREFILLED_SYRINGE | INTRAVENOUS | Status: DC | PRN
  Administered 2021-03-23: 4 ug via INTRAVENOUS

## 2021-03-23 MED ORDER — BUPIVACAINE-EPINEPHRINE (PF) 0.25% -1:200000 IJ SOLN
INTRAMUSCULAR | Status: AC
  Filled 2021-03-23: qty 30

## 2021-03-23 MED ORDER — SODIUM CHLORIDE 0.9 % IV SOLN
INTRAVENOUS | Status: DC | PRN
  Administered 2021-03-23: 90 ug/min via INTRAVENOUS

## 2021-03-23 MED ORDER — CEFAZOLIN SODIUM-DEXTROSE 2-4 GM/100ML-% IV SOLN
INTRAVENOUS | Status: AC
  Filled 2021-03-23: qty 100

## 2021-03-23 MED ORDER — PROPOFOL 500 MG/50ML IV EMUL
INTRAVENOUS | Status: DC | PRN
  Administered 2021-03-23: 50 ug/kg/min via INTRAVENOUS

## 2021-03-23 MED ORDER — FENTANYL CITRATE PF 50 MCG/ML IJ SOSY
PREFILLED_SYRINGE | INTRAMUSCULAR | Status: AC
  Filled 2021-03-23: qty 1

## 2021-03-23 MED ORDER — PHENYLEPHRINE HCL (PRESSORS) 10 MG/ML IV SOLN
INTRAVENOUS | Status: DC | PRN
  Administered 2021-03-23 (×2): 200 ug via INTRAVENOUS
  Administered 2021-03-23 (×3): 100 ug via INTRAVENOUS

## 2021-03-23 MED ORDER — CEFAZOLIN SODIUM-DEXTROSE 2-4 GM/100ML-% IV SOLN
2.0000 g | INTRAVENOUS | Status: AC
  Administered 2021-03-23: 2 g via INTRAVENOUS

## 2021-03-23 MED ORDER — LACTATED RINGERS IV SOLN: INTRAVENOUS | Status: DC

## 2021-03-23 MED ORDER — SODIUM CHLORIDE 0.9 % IV SOLN
INTRAVENOUS | Status: DC | PRN
  Administered 2021-03-23: 30 mL via INTRAMUSCULAR

## 2021-03-23 SURGICAL SUPPLY — 11 items
ADH SKN CLS APL DERMABOND .7 (GAUZE/BANDAGES/DRESSINGS) ×1
CANNULA 5F STIFF (CANNULA) ×2 IMPLANT
COVER PROBE U/S 5X48 (MISCELLANEOUS) ×2 IMPLANT
DERMABOND ADVANCED (GAUZE/BANDAGES/DRESSINGS) ×1
DERMABOND ADVANCED .7 DNX12 (GAUZE/BANDAGES/DRESSINGS) ×1 IMPLANT
DRAPE INCISE IOBAN 66X45 STRL (DRAPES) ×2 IMPLANT
KIT PORT POWER 8FR ISP CVUE (Port) ×2 IMPLANT
PACK ANGIOGRAPHY (CUSTOM PROCEDURE TRAY) ×2 IMPLANT
SUT MNCRL AB 4-0 PS2 18 (SUTURE) ×2 IMPLANT
SUT VIC AB 3-0 SH 27 (SUTURE) ×2
SUT VIC AB 3-0 SH 27X BRD (SUTURE) ×1 IMPLANT

## 2021-03-23 SURGICAL SUPPLY — 34 items
ADH SKN CLS APL DERMABOND .7 (GAUZE/BANDAGES/DRESSINGS) ×1
APL PRP STRL LF DISP 70% ISPRP (MISCELLANEOUS) ×1
BAG DECANTER FOR FLEXI CONT (MISCELLANEOUS) ×2 IMPLANT
BLADE SURG 15 STRL LF DISP TIS (BLADE) ×1 IMPLANT
BLADE SURG 15 STRL SS (BLADE) ×2
BOOT SUTURE AID YELLOW STND (SUTURE) ×2 IMPLANT
CHLORAPREP W/TINT 26 (MISCELLANEOUS) ×2 IMPLANT
COVER LIGHT HANDLE STERIS (MISCELLANEOUS) ×4 IMPLANT
DERMABOND ADVANCED (GAUZE/BANDAGES/DRESSINGS) ×1
DERMABOND ADVANCED .7 DNX12 (GAUZE/BANDAGES/DRESSINGS) ×1 IMPLANT
DRAPE C-ARM XRAY 36X54 (DRAPES) ×4 IMPLANT
ELECT CAUTERY BLADE 6.4 (BLADE) ×2 IMPLANT
ELECT REM PT RETURN 9FT ADLT (ELECTROSURGICAL) ×2
ELECTRODE REM PT RTRN 9FT ADLT (ELECTROSURGICAL) ×1 IMPLANT
GAUZE 4X4 16PLY ~~LOC~~+RFID DBL (SPONGE) ×2 IMPLANT
GLOVE SURG ORTHO LTX SZ7.5 (GLOVE) ×4 IMPLANT
GOWN STRL REUS W/ TWL LRG LVL3 (GOWN DISPOSABLE) ×2 IMPLANT
GOWN STRL REUS W/TWL LRG LVL3 (GOWN DISPOSABLE) ×4
IV CATH ANGIO 14GX1.88 NO SAFE (IV SOLUTION) ×2 IMPLANT
IV NS 500ML (IV SOLUTION) ×2
IV NS 500ML BAXH (IV SOLUTION) ×1 IMPLANT
KIT PORT POWER 8FR ISP CVUE (Port) IMPLANT
KIT TURNOVER KIT A (KITS) ×2 IMPLANT
MANIFOLD NEPTUNE II (INSTRUMENTS) ×2 IMPLANT
NEEDLE HYPO 22GX1.5 SAFETY (NEEDLE) ×2 IMPLANT
NEEDLE SPNL 18GX3.5 QUINCKE PK (NEEDLE) ×2 IMPLANT
PACK PORT-A-CATH (MISCELLANEOUS) ×2 IMPLANT
SUT MNCRL AB 4-0 PS2 18 (SUTURE) ×2 IMPLANT
SUT VIC AB 3-0 SH 27 (SUTURE) ×2
SUT VIC AB 3-0 SH 27X BRD (SUTURE) ×1 IMPLANT
SYR 10ML LL (SYRINGE) ×2 IMPLANT
SYR 3ML LL SCALE MARK (SYRINGE) ×2 IMPLANT
SYR 5ML LL (SYRINGE) ×2 IMPLANT
WATER STERILE IRR 500ML POUR (IV SOLUTION) IMPLANT

## 2021-03-23 NOTE — Progress Notes (Signed)
Office Visit    Patient Name: Amber Barnes Date of Encounter: 03/23/2021  PCP:  [name], [place]  Cardiologist:  Ida Rogue, MD  Advanced Practice Provider:  No care team member to display Electrophysiologist:  None  Chief Complaint    Chief Complaint  Patient presents with   Other    Pre-chemo evaluation c/o afib episode a few days ago. Meds reviewed verbally with pt.    70 y.o. female with history of PAF, obesity, OA, hyperlipidemia, hypertension, OSA on CPAP, migraines, H. pylori/peptic ulcer disease, anxiety, and who presents for follow-up of PAF and hypertension.  Past Medical History    Past Medical History:  Diagnosis Date   Achilles tendinitis    Acute medial meniscal tear    Allergy    Anxiety    Atrial fibrillation ([place])    a. new onset 07/2016; b. CHADS2VASc --> 4 (HTN, female, age, CHF); c. eliquis   CHF (congestive heart failure) (HCC)    Chronic anticoagulation    Apixaban   Chronic lower back pain    Colon polyp    Complication of anesthesia    Emergence delirium; hypoxia   COPD (chronic obstructive pulmonary disease) ([place])    Cystic breast    Depression    Endometriosis    Fibrocystic breast disease    GERD (gastroesophageal reflux disease)    History of stress test    a. 07/2016 MV: EF 55-65%. Small, mild defect  in mid anteroseptal, apical septal, and apical locations. No ischemia-->low risk.   Hyperlipidemia    Hypertension    Insomnia    Lipoma    Malignant neoplasm of upper-outer quadrant of right female breast (HCC)    Stage 1A invasive mammary carcinoma (cT1b or T1c N0); ER (-), PR (+), HER2/neu (+)   Migraine    Mild Mitral regurgitation    a. 07/2016 Echo: EF 55-65%, no rwma, mild MR. Mildly dil LA. Nl RV fx.    OSA (obstructive sleep apnea)    non-compliant with nocturnal PAP therapy   Osteoarthritis    left knee   Osteopenia    Peptic ulcer disease    a. H. pylori   Pneumonia     RLS (restless legs syndrome)    Tendonitis    Thyroid disease    Vitamin D deficiency    Past Surgical History:  Procedure Laterality Date   ABDOMINAL HYSTERECTOMY     ovaries left   ACHILLES TENDON SURGERY     2012,01/2017   BACK SURGERY     fusion lumbar   BREAST BIOPSY Right 01/15/2021   Affirm biopsy-"X" clip-INVASIVE MAMMARY CARCINOMA   BREAST BIOPSY Right 01/15/2021   u/s bx-"venus" clip INVASIVE MAMMARY CARCINOMA   BREAST BIOPSY     BREAST LUMPECTOMY,RADIO FREQ LOCALIZER,AXILLARY SENTINEL LYMPH NODE BIOPSY Right 02/17/2021   Procedure: BREAST LUMPECTOMY,RADIO FREQ LOCALIZER,AXILLARY SENTINEL LYMPH NODE BIOPSY;  Surgeon: Ronny Bacon, MD;  Location: [facility];  Service: General;  Laterality: Right;   CARDIOVERSION N/A 09/07/2016   Procedure: CARDIOVERSION;  Surgeon: Minna Merritts, MD;  Location: [facility];  Service: Cardiovascular;  Laterality: N/A;   CARDIOVERSION N/A 10/19/2016   Procedure: CARDIOVERSION;  Surgeon: Minna Merritts, MD;  Location: [facility];  Service: Cardiovascular;  Laterality: N/A;   CARDIOVERSION N/A 12/14/2016   Procedure: CARDIOVERSION;  Surgeon: Minna Merritts, MD;  Location: [facility];  Service: Cardiovascular;  Laterality: N/A;   COLONOSCOPY  COLONOSCOPY WITH PROPOFOL N/A 05/13/2020   Procedure: COLONOSCOPY WITH PROPOFOL;  Surgeon: Toledo, Benay Pike, MD;  Location: [facility];  Service: Gastroenterology;  Laterality: N/A;  ELIQUIS   ESOPHAGOGASTRODUODENOSCOPY     ESOPHAGOGASTRODUODENOSCOPY (EGD) WITH PROPOFOL N/A 05/13/2020   Procedure: ESOPHAGOGASTRODUODENOSCOPY (EGD) WITH PROPOFOL;  Surgeon: Toledo, Benay Pike, MD;  Location: [facility];  Service: Gastroenterology;  Laterality: N/A;   FOOT SURGERY Left    tendon   JOINT REPLACEMENT Right    hip   THYROIDECTOMY     thyroid lobectomy secondary to a benign nodule   TOTAL HIP ARTHROPLASTY Right 08/21/2018   Procedure: TOTAL HIP ARTHROPLASTY ANTERIOR APPROACH-RIGHT CELL SAVER  REQUESTED;  Surgeon: [name], MD;  Location: [facility];  Service: Orthopedics;  Laterality: Right;    Allergies  Allergies  Allergen Reactions   Prednisone Other (See Comments)    Agitation, hyperactivity, polyphagia   Amiodarone Nausea And Vomiting   Hydrocodone-Acetaminophen Other (See Comments)    Hallucinations and combative   Oxycodone Itching    Can take it with benadryl   Tapentadol Itching   Tramadol Itching    Can take it with benadryl   Adhesive [Tape] Itching and Rash    Prefers paper tape   Latex Itching and Rash    use paper tap    History of Present Illness    Amber Barnes is a 70 y.o. female with PMH as above.  She was diagnosed with atrial fibrillation 07/2016.  Echo showed normal LV SF, mild MR.  MPI low risk.  DCCV 08/2016.  She was managed on amiodarone, BB, Eliquis.  10/2019, she was seen in the ED for atrial fibrillation with intractable nausea, chest pain, epigastric pain, and abdominal pain.  Amiodarone discontinued, as it was felt to be contributing to the symptoms.  Beta-blocker dose adjusted.  She was later diagnosed with gastritis and H. pylori.  At her visit 05/2020, she is maintaining NSR.  She was hypertensive but home BP well controlled.  She reported a 4-day history of A. fib via MyChart 08/2020.  She was advised to take an additional beta-blocker for breakthrough tachypalpitations.  5/25, she was evaluated for left upper extremity swelling.  Ultrasound negative for DVT.  Concern noted for lymphedema with patient referred to vascular.    She was last seen in the office 12/16/2020 by Murray Hodgkins, NP, at which time it was noted she was stable from a cardiac standpoint.  She denied feeling any episodes of atrial fibrillation.  She reported chronic dyspnea on exertion in the setting of sedentary lifestyle, secondary to chronic back and hip pain.  She reported being able to ambulate around her home with a cane without dyspnea.  At high levels of  activity, she would need to rest more frequently.  She had intermittent nausea.  She had noticed some enlarging of the left bicep/tricep area, with ultrasound negative for DVT as above.  Since that time, she has had frequent visits with oncology for HER2 positive breast cancer.  On 12/30/2020, she had a screening mammogram done, showing possible mass and asymmetry in the right breast.  On 01/08/2021, diagnostic mammogram showed 2 adjacent irregular spiculated masses of the right breast with follow-up biopsy showing invasive carcinoma/invasive mammary carcinoma.  On 02/2021, she underwent lumpectomy and sentinel lymph node biopsy.  Pathology showed invasive mammary carcinoma.  She was diagnosed with right breast cancer. On 03/03/2021, she saw her oncologist with recommendation for adjuvant chemotherapy with weekly Taxol plus trastuzumab for 12 weeks,  followed by trastuzumab maintenance every 21 days for 13 cycles.  Recommendation was for patient to follow-up with cardiology prior to chemotherapy.  03/08/2021 echo showed EF 60 to 65%, mild LVH, G1 DD, GLS -16.3%, mild LAE, trivial MR, but was not able to evaluate regional wall motion.  Today, 03/23/2021, she is seen today prior to initiation of chemotherapy.  She denies any recent chest pain or shortness of breath.  She is overall doing well from a cardiac standpoint.  She is scheduled to have insertion of a Port-A-Cath today and start of second chemotherapy treatment, which is the reason for her visit today.  She states oncology has informed her that her chemotherapy may affect her heart, and she is already scheduled for serial echocardiograms through oncology.  She denies any recent chest pain or shortness of breath.  She continues to have intermittent episodes of atrial fibrillation.  She can usually tell when she is in atrial fibrillation, as she notices a heavy feeling in her legs and usually feels nauseated.  A few days ago, she went into atrial fibrillation for  approximately 3 to 4 days and then self converted back to NSR.  She reports ventricular rate was not elevated beyond 118 bpm.  She denies any tachypalpitations with episodes of atrial fibrillation or when NSR.  She does report chronic dizziness, attributed to vertigo.  No syncope or falls.  She denies any recent lower extremity edema, abdominal distention, orthopnea, PND, or early satiety.  She reports unchanged/chronic fatigue.  She does report some stressors but also is reassured by the communication of her physicians.  She reports recent issues with electrolyte imbalance, though improving with increased potassium supplementation.  We discussed getting a recheck of her electrolytes through her oncologist, and if abnormal, consolidating her HCTZ and Lasix, as the both of these are known to cause electrolyte abnormalities.  We discussed emesis/diarrhea and their influence on electrolyte abnormalities as well, encouraging her to call the office if ever prolonged episodes and tachypalpitations.  We reviewed monitoring her QTC on Ranexa.  CHF education provided.  She is currently holding her Eliquis for insertion of Port-A-Cath with restart tonight.  No signs or symptoms consistent with bleeding.    Home Medications   Current Outpatient Medications  Medication Instructions   acetaminophen (TYLENOL) 500 mg, Oral, Every 6 hours PRN, Taking as needed    albuterol (VENTOLIN HFA) 108 (90 Base) MCG/ACT inhaler 2 puffs, Inhalation, Every 8 hours PRN   ALPRAZolam (XANAX) 0.25 MG tablet TAKE 1 TABLET BY MOUTH DAILY AS NEEDED FOR ANXIETY OR SLEEP.   azelastine (ASTELIN) 0.1 % nasal spray Each Nare, As needed, Use in each nostril as directed   B Complex Vitamins (VITAMIN B COMPLEX PO) 1 tablet, Oral, Daily   bisoprolol (ZEBETA) 5 MG tablet TAKE 1 TABLET BY MOUTH IN THE MORNING AND MAY TAKE AN EXTRA IN THE EVENING IF NEEDED FOR HEART RATE   busPIRone (BUSPAR) 5 MG tablet TAKE 1 TABLET BY MOUTH THREE TIMES A DAY AS  NEEDED   calcium gluconate 500 MG tablet 500 tablets, Oral, Daily   calcium-vitamin D (OSCAL WITH D) 500-200 MG-UNIT tablet 1 tablet, Oral, Daily with breakfast   DULoxetine (CYMBALTA) 30 MG capsule Take two tablets by mouth for a total of 60 MG daily.   ELIQUIS 5 MG TABS tablet TAKE 1 TABLET BY MOUTH TWICE A DAY   esomeprazole (NEXIUM) 20 mg, Oral, As needed   furosemide (LASIX) 20 MG tablet TAKE 1 TABLET BY  MOUTH TWICE A DAY   hydrochlorothiazide (MICROZIDE) 12.5 MG capsule TAKE 1 CAPSULE BY MOUTH EVERY DAY   ibuprofen (ADVIL) 800 mg, Oral, Every 8 hours PRN   lidocaine-prilocaine (EMLA) cream Apply to affected area once   losartan (COZAAR) 100 MG tablet TAKE 1 TABLET BY MOUTH DAILY. PLEASE CALL OFFICE TO SCHEDULE AN APPOINTMENT FOR FURTHER REFILLS   Multiple Vitamin (MULTIVITAMIN) tablet 1 tablet, Oral, Daily   potassium chloride SA (KLOR-CON) 20 MEQ tablet 20 mEq, Oral, 2 times daily   prochlorperazine (COMPAZINE) 10 mg, Oral, Every 6 hours PRN   ranolazine (RANEXA) 500 MG 12 hr tablet TAKE 1 TABLET BY MOUTH TWICE A DAY   rizatriptan (MAXALT) 10 mg, Oral, As needed, May repeat in 2 hours if needed   rOPINIRole (REQUIP) 1 MG tablet TAKE 1 TABLET BY MOUTH AT BEDTIME.   rosuvastatin (CRESTOR) 20 MG tablet TAKE 1 TABLET BY MOUTH EVERY DAY   Vitamin D3 5,000 Units, Oral, Daily     Review of Systems    She reports chronic dizziness and symptoms when in atrial fibrillation, including leg heaviness and nausea.  She occasionally has episodes of emesis when nauseated.  She had a self resolving episode of atrial fibrillation 3 to 4 days ago.  She reports unchanged chronic fatigue.  She denies chest pain, palpitations, dyspnea, pnd, orthopnea, syncope, edema, weight gain, or early satiety.  All other systems reviewed and are otherwise negative except as noted above.  Physical Exam    VS:  BP 120/70 (BP Location: Right Arm, Patient Position: Sitting, Cuff Size: Large)   Pulse (!) 50   Ht 5' 5"   (1.651 m)   Wt 285 lb 2 oz (129.3 kg)   SpO2 98%   BMI 47.45 kg/m  , BMI Body mass index is 47.45 kg/m. GEN: Well nourished, well developed, in no acute distress. HEENT: normal. Neck: Supple.no carotid bruits, or masses.  JVD difficult to assess due to body habitus. Cardiac: Bradycardic but regular, no murmurs, rubs, or gallops. No clubbing, cyanosis, edema.  Radials/DP/PT 2+ and equal bilaterally.  Respiratory:  Respirations regular and unlabored, distant breath sounds, clear to auscultation bilaterally. GI: Soft, nontender, nondistended, BS + x 4. MS: no deformity or atrophy. Skin: warm and dry, no rash. Neuro:  Strength and sensation are intact. Psych: Normal affect.  Accessory Clinical Findings    ECG personally reviewed by me today -sinus bradycardia with sinus arrhythmia, 50 bpm, LVH, T wave inversion in 3, aVF, V1 through V4 as seen on prior tracings including 05/2020- no acute changes.  VITALS Reviewed today   Temp Readings from Last 3 Encounters:  03/03/21 (!) 96.3 F (35.7 C) (Tympanic)  03/02/21 98.4 F (36.9 C) (Oral)  02/17/21 (!) 97.1 F (36.2 C) (Temporal)   BP Readings from Last 3 Encounters:  03/23/21 120/70  03/03/21 134/78  03/02/21 128/86   Pulse Readings from Last 3 Encounters:  03/23/21 (!) 50  03/03/21 (!) 55  03/02/21 (!) 55    Wt Readings from Last 3 Encounters:  03/23/21 285 lb 2 oz (129.3 kg)  03/22/21 283 lb (128.4 kg)  03/03/21 286 lb (129.7 kg)     LABS  reviewed today   Lab Results  Component Value Date   WBC 6.3 01/19/2021   HGB 13.6 02/17/2021   HCT 40.0 02/17/2021   MCV 90.9 01/19/2021   PLT 183 01/19/2021   Lab Results  Component Value Date   CREATININE 1.04 03/12/2021   BUN 16 03/12/2021  NA 140 03/12/2021   K 3.5 03/12/2021   CL 101 03/12/2021   CO2 28 03/12/2021   Lab Results  Component Value Date   ALT 17 01/19/2021   AST 21 01/19/2021   ALKPHOS 75 01/19/2021   BILITOT 0.8 01/19/2021   Lab Results   Component Value Date   CHOL 190 03/25/2020   HDL 69.30 03/25/2020   LDLCALC 96 03/25/2020   TRIG 122.0 03/25/2020   CHOLHDL 3 03/25/2020    Lab Results  Component Value Date   HGBA1C 5.2 03/25/2020   Lab Results  Component Value Date   TSH 4.07 03/25/2020     STUDIES/PROCEDURES reviewed today   Echo 03/08/2021  1. Left ventricular ejection fraction, by estimation, is 60 to 65%. The  left ventricle has normal function. Left ventricular endocardial border  not optimally defined to evaluate regional wall motion. There is mild left  ventricular hypertrophy. Left  ventricular diastolic parameters are consistent with Grade I diastolic  dysfunction (impaired relaxation). The average left ventricular global  longitudinal strain is -16.3 %. The global longitudinal strain is normal.   2. Right ventricular systolic function is normal. The right ventricular  size is normal.   3. Left atrial size was mildly dilated.   4. The mitral valve is normal in structure. Trivial mitral valve  regurgitation. No evidence of mitral stenosis.   5. The aortic valve is normal in structure. Aortic valve regurgitation is  not visualized. No aortic stenosis is present.  Assessment & Plan    Preoperative cardiac evaluation HER2 positive carcinoma of the breast with plan for initiation of chemotherapy -- No chest pain or shortness of breath.  Seen today prior to initiation of chemotherapy.  Most recent echo, ordered by oncology, reviewed today with patient.  Agree with serial echocardiograms by oncology/ echo every 3 months.  Recommend also that we keep a close eye on electrolytes --> recommend obtain BMET/magnesium at upcoming oncology labs.  With ongoing abnormal electrolyte abnormalities/hypokalemia, recommend consolidation of HCTZ/Lasix. Patient educated on symptoms of heart failure in great detail today.  Reviewed recommendations for salt and fluid restrictions.  She will call the office if any concerning  symptoms as reviewed today in the office and plan for serial echoes and clinic EKGs as above.   Paroxysmal atrial fibrillation on anticoagulation --Continues to report periodic episodes of Afib with pt SB today. Reports leg heaviness and nausea when in atrial fibrillation.  No tachypalpitations.  She reports chronic dizziness and unchanged from previous visits.  Amiodarone previously discontinued in the setting of nausea.  She states she had a recent episode of atrial fibrillation leading up to this most recent clinic visit, which is not unusual for her.  Recommend recheck of electrolytes with patient report that she is scheduled to have upcoming labs with oncology.  She recently had her potassium increased with discussion today that we recommend she discontinue HCTZ if ongoing hypokalemia, given she is already on Lasix as well at this time.  EKG recommended at RTC.  Continue current bisoprolol and Eliquis.  CHA2DS2VASc score of at least 4 (HFpEF, hypertension, age, female).  She denies signs or symptoms of bleeding.  02/2021 H&H stable.  We reviewed the recommendation for head scan if ever she falls and hits her head.  Slow position changes recommended, given her chronic dizziness, which she reports is due to vertigo.  Given her upcoming port insertion, she has been holding Eliquis with report that she is due to restart it tonight.  H/o hypokalemia --9/2 potassium 3.5.  She reports that oncology recently increased her potassium to get her back to goal potassium 4.0. She does report upcoming labs with her oncologist.  We will send a message to oncology to recommend recheck electrolytes at that time, as well as magnesium.  As above, future recommendations include consolidation of HCTZ/Lasix to avoid future electrolyte abnormalities.  If potassium remains low on recheck, recommend discontinue HCTZ to avoid patient staying on 2 diuretics at 1 time.  Essential hypertension --BP well controlled.  Reviewed  recommendation for BP 130/80 or lower.  Continue beta-blocker, ARB, diuretic.  HLD --Continue Crestor 20 mg daily.  Gastritis/nausea --In the past, treated successfully for H. pylori.  She does have a history of gastritis.  Continue PPI.  We reviewed the recommendation for repeat BMET if ever ongoing emesis or diarrhea to recheck electrolytes with patient understanding.   Disposition: RTC in 1 month or s/p start of chemotherapy in person with primary cardiologist  *Please be aware that the above documentation was completed voice recognition software and may contain dictation errors.       Arvil Chaco, PA-C 03/23/2021

## 2021-03-23 NOTE — Patient Instructions (Signed)
Medication Instructions:   Your physician recommends that you continue on your current medications as directed. Please refer to the Current Medication list given to you today.  *If you need a refill on your cardiac medications before your next appointment, please call your pharmacy*   Lab Work:  None ordered  Testing/Procedures:  None ordered   Follow-Up: At [facility], you and your health needs are our priority.  As part of our continuing mission to provide you with exceptional heart care, we have created designated Provider Care Teams.  These Care Teams include your primary Cardiologist (physician) and Advanced Practice Providers (APPs -  Physician Assistants and Nurse Practitioners) who all work together to provide you with the care you need, when you need it.  We recommend signing up for the patient portal called "MyChart".  Sign up information is provided on this After Visit Summary.  MyChart is used to connect with patients for Virtual Visits (Telemedicine).  Patients are able to view lab/test results, encounter notes, upcoming appointments, etc.  Non-urgent messages can be sent to your provider as well.   To learn more about what you can do with MyChart, go to [email or web address].    Your next appointment:    Follow up with Dr. [name] 04/14/21 - for follow up after starting chemotherapy  The format for your next appointment:   In Person  Provider:   You may see Ida Rogue, MD

## 2021-03-23 NOTE — H&P (View-Only) (Signed)
[facility] POST-OP OFFICE VISIT  03/23/2021  HPI: Amber Barnes is a 70 y.o. female s/p right breast lumpectomy and SLNBx.  She has no complaints, denies any numbness or axillary pain.  Some periincisional numbness as expected.  Denies any history of bruising or ecchymosis.  Anticipation of chemotherapy, desiring for placement of Port-A-Cath.  Vital signs: There were no vitals taken for this visit.   Physical Exam: Constitutional: She appears well.  Skin: Right breast incision is clean, dry and intact.  There is no dermal edema, induration or erythema.  There is no mass-effect or evidence of seroma or hematoma present.  SURGICAL PATHOLOGY  CASE: [id number]  PATIENT: Amber Barnes  Surgical Pathology Report      Specimen Submitted:  A. Breast, right  B. Breast, right extra  C. Sentinel node, right axilla   Clinical History: Right breast cancer       DIAGNOSIS:  A. BREAST, RIGHT LATERAL; LUMPECTOMY:  - INVASIVE MAMMARY CARCINOMA, TWO SEPARATE FOCI.  - DUCTAL CARCINOMA IN SITU (DCIS).  - SEE CANCER SUMMARY BELOW.  - TWO BIOPSY SITES WITH VENUS AND X-SHAPED CLIPS.  - RF ID TAG IN PLACE.   B.  BREAST, RIGHT EXTRA; EXCISION:  - BENIGN BREAST TISSUE.  - NEGATIVE FOR MALIGNANCY.   C.  SENTINEL LYMPH NODE, RIGHT #1; EXCISION:  - ONE LYMPH NODE NEGATIVE FOR MALIGNANCY (0/1).   CANCER CASE SUMMARY: INVASIVE CARCINOMA OF THE BREAST  Standard(s): AJCC-UICC 8   SPECIMEN  Procedure: Lumpectomy  Specimen Laterality: Right   TUMOR  Histologic Type: Invasive carcinoma of no special type  Histologic Grade (Nottingham Histologic Score)                       Glandular (Acinar)/Tubular Differentiation: 3                       Nuclear Pleomorphism: 2                       Mitotic Rate: 3                       Overall Grade: 3  Tumor Size: Greatest dimension of largest invasive focus measures 10 mm  Ductal Carcinoma In Situ (DCIS): Present, nuclear  grade 2 with focal  comedonecrosis  Lymphovascular Invasion: Not identified  Treatment Effect in the Breast: No known presurgical therapy   MARGINS  Margin Status for Invasive Carcinoma: All margins negative for invasive  carcinoma                       Distance from closest margin: 2.1 mm                       Specify closest margin: Superior   Margin Status for DCIS: All margins negative for DCIS                       Distance from DCIS to closest margin: 3 mm                       Specify closest margin: Anterior    REGIONAL LYMPH NODES  Regional Lymph Node Status: All regional lymph nodes negative for tumor                         Total Number of Lymph Nodes Examined (sentinel and  non-sentinel): 1                       Number of Sentinel Nodes Examined: 1   DISTANT METASTASIS  Distant Site(s) Involved, if applicable: Not applicable   PATHOLOGIC STAGE CLASSIFICATION (pTNM, AJCC 8th Edition):  TNM Descriptors: (Multiple foci of invasive carcinoma)  pT1b  Regional Lymph Nodes Modifier: (sn)  pN0  pM - Not applicable   SPECIAL STUDIES  Breast Biomarker Testing Performed on Previous Biopsy: [id number] A1  (X-clip)  Estrogen Receptor (ER) Status: Negative  Progesterone Receptor (PgR) Status: Positive  HER2 (by immunohistochemistry): Positive   Breast biomarkers have been ordered on ARS22-4417 B1 (venus clip) and  are pending at the time of the sign-out of this case.   Comment:  Residual invasive carcinoma and DCIS are present at both of the previous  biopsy sites and the morphology is the same. At the venus clip, the  residual carcinoma measures 10 mm. Invasive carcinoma is 2.1 mm and DCIS  is 13 mm from the superior margin. At the site of the X clip the  residual carcinoma measures 3 mm.  Invasive carcinoma is 4 mm from the  posterior margin and DCIS is 3 mm to the anterior margin.  Assessment/Plan: This is a 70 y.o. female 13 days s/p right breast lumpectomy and  SLNBx.  Will begin chemotherapy later this week, presenting now for Port-A-Cath placement.  Patient Active Problem List   Diagnosis Date Noted   HER2-positive carcinoma of breast (HCC) 01/26/2021   Goals of care, counseling/discussion 01/26/2021   Malignant neoplasm of upper-outer quadrant of right female breast (HCC) 01/19/2021   Left arm pain 12/02/2020   Soft tissue mass 12/02/2020   Candidal intertrigo 09/09/2020   Urinary frequency 09/09/2020   Night sweat 03/31/2020   Nasal drainage 12/18/2019   Sinus congestion 12/18/2019   Nausea 10/30/2019   Major depression in remission (HCC) 08/27/2018   Primary osteoarthritis involving multiple joints 08/27/2018   Atrial fibrillation (HCC) 08/27/2018   COPD (chronic obstructive pulmonary disease) (HCC) 08/27/2018   BMI 50.0-59.9, adult (HCC) 07/16/2018   Morbid obesity with BMI of 50.0-59.9, adult (HCC) 05/31/2018   Bradycardia 05/22/2017   Restless leg syndrome 04/13/2017   OSA (obstructive sleep apnea) 01/09/2017   Fatty liver disease, nonalcoholic 06/30/2016   Family history of ovarian cancer 06/08/2016   GERD (gastroesophageal reflux disease) 05/05/2016   Hypertension 11/11/2014   Anxiety and depression 11/11/2014   Weight loss 11/11/2014    We discussed risk/benefits of Port-A-Cath placement and these include but are not limited to anesthesia, bleeding, infection, dysfunction, venous or arterial injury, pneumothorax.  I believe she understands and desires to proceed.  No guarantees were ever expressed or implied.     I will be glad to assist in her further care as needed.  Tasean Mancha M.D., FACS 03/23/2021, 9:13 AM  

## 2021-03-23 NOTE — Discharge Instructions (Signed)
AMBULATORY SURGERY  ?DISCHARGE INSTRUCTIONS ? ? ?The drugs that you were given will stay in your system until tomorrow so for the next 24 hours you should not: ? ?Drive an automobile ?Make any legal decisions ?Drink any alcoholic beverage ? ? ?You may resume regular meals tomorrow.  Today it is better to start with liquids and gradually work up to solid foods. ? ?You may eat anything you prefer, but it is better to start with liquids, then soup and crackers, and gradually work up to solid foods. ? ? ?Please notify your doctor immediately if you have any unusual bleeding, trouble breathing, redness and pain at the surgery site, drainage, fever, or pain not relieved by medication. ? ? ? ?Additional Instructions: ? ? ? ?Please contact your physician with any problems or Same Day Surgery at [phone], Monday through Friday 6 am to 4 pm, or [facility] number at [phone].  ?

## 2021-03-23 NOTE — Anesthesia Preprocedure Evaluation (Signed)
Anesthesia Evaluation  Patient identified by MRN, date of birth, ID band Patient awake    Reviewed: Allergy & Precautions, NPO status , Patient's Chart, lab work & pertinent test results, reviewed documented beta blocker date and time   History of Anesthesia Complications (+) Emergence Delirium and history of anesthetic complications  Airway Mallampati: II  TM Distance: <3 FB Neck ROM: Full    Dental no notable dental hx.    Pulmonary sleep apnea , COPD, former smoker,    Pulmonary exam normal        Cardiovascular Exercise Tolerance: Good hypertension, Pt. on medications Normal cardiovascular examI     Neuro/Psych    GI/Hepatic GERD  Medicated and Controlled,  Endo/Other    Renal/GU      Musculoskeletal   Abdominal Normal abdominal exam  (+)   Peds  Hematology   Anesthesia Other Findings   Reproductive/Obstetrics                             Anesthesia Physical Anesthesia Plan  ASA: 3  Anesthesia Plan: General   Post-op Pain Management:    Induction: Intravenous  PONV Risk Score and Plan:   Airway Management Planned: Natural Airway and Simple Face Mask  Additional Equipment: None  Intra-op Plan:   Post-operative Plan:   Informed Consent: I have reviewed the patients History and Physical, chart, labs and discussed the procedure including the risks, benefits and alternatives for the proposed anesthesia with the patient or authorized representative who has indicated his/her understanding and acceptance.       Plan Discussed with: CRNA  Anesthesia Plan Comments:         Anesthesia Quick Evaluation

## 2021-03-23 NOTE — Op Note (Signed)
OPERATIVE NOTE   PROCEDURE: Placement of a left IJ Infuse-a-Port  PRE-OPERATIVE DIAGNOSIS: Breast carcinoma  POST-OPERATIVE DIAGNOSIS: Same  SURGEON: Katha Cabal M.D.  ANESTHESIA: Conscious sedation was administered under my direct supervision by the interventional radiology RN. IV Versed plus fentanyl were utilized. Continuous ECG, pulse oximetry and blood pressure was monitored throughout the entire procedure. Conscious sedation was for a total of 32 minutes.  ESTIMATED BLOOD LOSS: Minimal   FINDING(S): 1.  Patent vein  SPECIMEN(S): None  INDICATIONS:   Amber Barnes is a 70 y.o. female who presents with right-sided breast carcinoma.  She is scheduled to undergo chemotherapy beginning tomorrow morning.  Earlier today attempts at placing a port were unsuccessful and I been asked if I am able to secure her access.  Risk and benefits were been discussed all questions have been answered with the patient's family as the patient has undergone anesthesia earlier today patient's family has agreed to proceed.  DESCRIPTION: After obtaining full informed written consent, the patient was brought back to the special procedure suite and placed in the supine position. The patient's left neck and chest wall are prepped and draped in sterile fashion. Appropriate timeout was called.  Ultrasound is placed in a sterile sleeve, ultrasound is utilized to avoid vascular injury as well as secondary to lack of appropriate landmarks. The left internal jugular vein is identified. It is echolucent and homogeneous as well as easily compressible indicating patency. An image is recorded for the permanent record.  Access to the vein with a micropuncture needle is done under direct ultrasound visualization.  1% lidocaine is infiltrated into the soft tissue at the base of the neck as well as on the chest wall.  Under direct ultrasound visualization a micro-needle is inserted into the vein followed by the  micro-wire. Micro-sheath was then advanced and a J wire is inserted without difficulty under fluoroscopic guidance. A small counterincision was created at the wire insertion site. A transverse incision is created 2 fingerbreadths below the scapula and a pocket is fashioned using both blunt and sharp dissection. The pocket is tested for appropriate size with the hub of the Infuse-a-Port. The tunneling device is then used to pull the intravascular portion of the catheter from the pocket to the neck counterincision.  Dilator and peel-away sheath were then inserted over the wire and the wire is removed. Catheter is then advanced into the venous system without difficulty. Peel-away sheath was then removed.  Catheter is then positioned under fluoroscopic guidance at the atrial caval junction. It is then transected connected to the hub and the hope is slipped into the subcutaneous pocket on the chest wall. The hub was then accessed percutaneously and aspirates easily and flushes well and is flushed with 30 cc of heparinized saline. The pocket incision is then closed in layers using interrupted 3-0 Vicryl for the subcutaneous tissues and 4-0 Monocryl subcuticular for skin closure. Dermabond is applied. The neck counterincision was closed with 4-0 Monocryl subcuticular and Dermabond as well.  The patient tolerated the procedure well and there were no immediate complications.  COMPLICATIONS: None  CONDITION: Unchanged  Katha Cabal M.D. [facility] Office: [phone]   03/23/2021, 6:27 PM

## 2021-03-23 NOTE — Transfer of Care (Signed)
Immediate Anesthesia Transfer of Care Note  Patient: Amber Barnes  Procedure(s) Performed: ATTEMPTED INSERTION PORT-A-CATH  Patient Location: PACU  Anesthesia Type: General  Level of Consciousness: awake  Airway & Oxygen Therapy: Patient Spontanous Breathing and Patient connected to face mask oxygen  Post-op Assessment: Report given to RN and Post -op Vital signs reviewed and stable  Post vital signs: Reviewed and stable  Last Vitals:  Vitals Value Taken Time  BP 118/62 03/23/21 1354  Temp    Pulse 71 03/23/21 1358  Resp 19 03/23/21 1358  SpO2 100 % 03/23/21 1358  Vitals shown include unvalidated device data.  Last Pain:  Vitals:   03/23/21 1019  TempSrc: Tympanic         Complications: No notable events documented.

## 2021-03-23 NOTE — Consult Note (Signed)
[facility] Vascular Consult Note  MRN : [id number]  Tyeasha Ebbs is a 70 y.o. (1951-02-15) female who presents with chief complaint of schedule port-a-cath placement  History of Present Illness:  The patient is a 70 year old female with a past medical history of morbid obesity, anxiety, depression, GERD, fatty liver disease, OSA, atrial fibrillation, COPD, with a recent diagnosis of breast cancer who presents for a scheduled Port-A-Cath placement.  First attempt at Port-A-Cath placement by Dr. [name] today was unsuccessful and therefore vascular surgery was emergently consulted. Patient was still under anesthesia for this consult so consent was obtained from her family member.   Current Facility-Administered Medications  Medication Dose Route Frequency Provider Last Rate Last Admin   Chlorhexidine Gluconate Cloth 2 % PADS 6 each  6 each Topical Once Ronny Bacon, MD       And   Chlorhexidine Gluconate Cloth 2 % PADS 6 each  6 each Topical Once Ronny Bacon, MD       fentaNYL (SUBLIMAZE) injection 25-50 mcg  25-50 mcg Intravenous Q5 min PRN Lennox Pippins, MD   25 mcg at 03/23/21 1420   lactated ringers infusion   Intravenous Continuous [place] Ouch, MD   New Bag at 03/23/21 1249   meperidine (DEMEROL) injection 6.25-12.5 mg  6.25-12.5 mg Intravenous Q5 min PRN Lennox Pippins, MD       ondansetron [facility]) injection 4 mg  4 mg Intravenous Once PRN Lennox Pippins, MD       oxyCODONE (Oxy IR/ROXICODONE) immediate release tablet 5 mg  5 mg Oral Once PRN Lennox Pippins, MD       Or   oxyCODONE (ROXICODONE) 5 MG/5ML solution 5 mg  5 mg Oral Once PRN Lennox Pippins, MD       Past Medical History:  Diagnosis Date   Achilles tendinitis    Acute medial meniscal tear    Allergy    Anxiety    Atrial fibrillation ([place])    a. new onset 07/2016; b. CHADS2VASc --> 4 (HTN, female, age, CHF); c. eliquis   CHF (congestive heart failure)  (HCC)    Chronic anticoagulation    Apixaban   Chronic lower back pain    Colon polyp    Complication of anesthesia    Emergence delirium; hypoxia   COPD (chronic obstructive pulmonary disease) ([place])    Cystic breast    Depression    Endometriosis    Fibrocystic breast disease    GERD (gastroesophageal reflux disease)    History of stress test    a. 07/2016 MV: EF 55-65%. Small, mild defect  in mid anteroseptal, apical septal, and apical locations. No ischemia-->low risk.   Hyperlipidemia    Hypertension    Insomnia    Lipoma    Malignant neoplasm of upper-outer quadrant of right female breast (HCC)    Stage 1A invasive mammary carcinoma (cT1b or T1c N0); ER (-), PR (+), HER2/neu (+)   Migraine    Mild Mitral regurgitation    a. 07/2016 Echo: EF 55-65%, no rwma, mild MR. Mildly dil LA. Nl RV fx.    OSA (obstructive sleep apnea)    non-compliant with nocturnal PAP therapy   Osteoarthritis    left knee   Osteopenia    Peptic ulcer disease    a. H. pylori   Pneumonia    RLS (restless legs syndrome)    Tendonitis    Thyroid disease    Vitamin D deficiency    Past Surgical History:  Procedure Laterality Date   ABDOMINAL HYSTERECTOMY     ovaries left   ACHILLES TENDON SURGERY     2012,01/2017   BACK SURGERY     fusion lumbar   BREAST BIOPSY Right 01/15/2021   Affirm biopsy-"X" clip-INVASIVE MAMMARY CARCINOMA   BREAST BIOPSY Right 01/15/2021   u/s bx-"venus" clip INVASIVE MAMMARY CARCINOMA   BREAST BIOPSY     BREAST LUMPECTOMY,RADIO FREQ LOCALIZER,AXILLARY SENTINEL LYMPH NODE BIOPSY Right 02/17/2021   Procedure: BREAST LUMPECTOMY,RADIO FREQ LOCALIZER,AXILLARY SENTINEL LYMPH NODE BIOPSY;  Surgeon: Ronny Bacon, MD;  Location: [facility];  Service: General;  Laterality: Right;   CARDIOVERSION N/A 09/07/2016   Procedure: CARDIOVERSION;  Surgeon: Minna Merritts, MD;  Location: [facility];  Service: Cardiovascular;  Laterality: N/A;   CARDIOVERSION N/A 10/19/2016    Procedure: CARDIOVERSION;  Surgeon: Minna Merritts, MD;  Location: [facility];  Service: Cardiovascular;  Laterality: N/A;   CARDIOVERSION N/A 12/14/2016   Procedure: CARDIOVERSION;  Surgeon: Minna Merritts, MD;  Location: [facility];  Service: Cardiovascular;  Laterality: N/A;   COLONOSCOPY     COLONOSCOPY WITH PROPOFOL N/A 05/13/2020   Procedure: COLONOSCOPY WITH PROPOFOL;  Surgeon: Toledo, Benay Pike, MD;  Location: [facility];  Service: Gastroenterology;  Laterality: N/A;  ELIQUIS   ESOPHAGOGASTRODUODENOSCOPY     ESOPHAGOGASTRODUODENOSCOPY (EGD) WITH PROPOFOL N/A 05/13/2020   Procedure: ESOPHAGOGASTRODUODENOSCOPY (EGD) WITH PROPOFOL;  Surgeon: Toledo, Benay Pike, MD;  Location: [facility];  Service: Gastroenterology;  Laterality: N/A;   FOOT SURGERY Left    tendon   JOINT REPLACEMENT Right    hip   THYROIDECTOMY     thyroid lobectomy secondary to a benign nodule   TOTAL HIP ARTHROPLASTY Right 08/21/2018   Procedure: TOTAL HIP ARTHROPLASTY ANTERIOR APPROACH-RIGHT CELL SAVER REQUESTED;  Surgeon: [name], MD;  Location: [facility];  Service: Orthopedics;  Laterality: Right;   Social History Social History   Tobacco Use   Smoking status: Former    Packs/day: 0.25    Years: 10.00    Pack years: 2.50    Types: Cigarettes    Quit date: 2005    Years since quitting: 17.7   Smokeless tobacco: Never   Tobacco comments:    smoked for 10 years, 1 pack every 2-3 days  Vaping Use   Vaping Use: Never used  Substance Use Topics   Alcohol use: Not Currently    Alcohol/week: 1.0 standard drink    Types: 1 Glasses of wine per week    Comment: rarely   Drug use: No   Family History Family History  Problem Relation Age of Onset   Cancer Sister 90       ovarian   Depression Sister    Cancer Brother        CLL   Cancer Father 93       colon   Heart disease Father    Heart attack Father 44       died from MI   Hyperlipidemia Father    Hypertension Father    Cancer  Mother        leukemia   Breast cancer Paternal Aunt        30's   Migraines Neg Hx   Family member denies peripheral artery disease, venous disease or renal disease.  Allergies  Allergen Reactions   Prednisone Other (See Comments)    Agitation, hyperactivity, polyphagia   Amiodarone Nausea And Vomiting   Hydrocodone-Acetaminophen Other (See Comments)    Hallucinations and combative   Oxycodone  Itching    Can take it with benadryl   Tapentadol Itching   Tramadol Itching    Can take it with benadryl   Adhesive [Tape] Itching and Rash    Prefers paper tape   Latex Itching and Rash    use paper tap   REVIEW OF SYSTEMS (Negative unless checked)  Constitutional: [] Weight loss  [] Fever  [] Chills Cardiac: [] Chest pain   [] Chest pressure   [] Palpitations   [] Shortness of breath when laying flat   [] Shortness of breath at rest   [] Shortness of breath with exertion. Vascular:  [] Pain in legs with walking   [] Pain in legs at rest   [] Pain in legs when laying flat   [] Claudication   [] Pain in feet when walking  [] Pain in feet at rest  [] Pain in feet when laying flat   [] History of DVT   [] Phlebitis   [] Swelling in legs   [] Varicose veins   [] Non-healing ulcers Pulmonary:   [] Uses home oxygen   [] Productive cough   [] Hemoptysis   [] Wheeze  [x] COPD   [] Asthma Neurologic:  [] Dizziness  [] Blackouts   [] Seizures   [] History of stroke   [] History of TIA  [] Aphasia   [] Temporary blindness   [] Dysphagia   [] Weakness or numbness in arms   [] Weakness or numbness in legs Musculoskeletal:  [x] Arthritis   [] Joint swelling   [] Joint pain   [] Low back pain Hematologic:  [] Easy bruising  [] Easy bleeding   [] Hypercoagulable state   [] Anemic  [] Hepatitis Gastrointestinal:  [] Blood in stool   [] Vomiting blood  [x] Gastroesophageal reflux/heartburn   [] Difficulty swallowing. Genitourinary:  [] Chronic kidney disease   [] Difficult urination  [] Frequent urination  [] Burning with urination   [] Blood in urine Skin:   [] Rashes   [] Ulcers   [] Wounds Psychological:  [] History of anxiety   []  History of major depression.  Physical Examination  Vitals:   03/23/21 1019 03/23/21 1356 03/23/21 1400 03/23/21 1415  BP: (!) 152/76 118/62 120/69 129/82  Pulse: (!) 55 70 66 65  Resp: 16 12 15    Temp: (!) 97 F (36.1 C) 98 F (36.7 C)    TempSrc: Tympanic     SpO2: 100% 100% 100% 100%  Weight: 128.8 kg     Height: 5' 5"  (1.651 m)      Body mass index is 47.26 kg/m. Gen:  NAD Head: [place]/AT, No temporalis wasting. Prominent temp pulse not noted. Ear/Nose/Throat: Hearing grossly intact, nares w/o erythema or drainage, oropharynx w/o Erythema/Exudate Eyes: Sclera non-icteric, conjunctiva clear Neck: Trachea midline.  No JVD.  Pulmonary:  Good air movement, respirations not labored, equal bilaterally.  Cardiac: Irregularly irregular Vascular:  Vessel Right Left  Radial Palpable Palpable  Ulnar Palpable Palpable   Gastrointestinal: soft, non-tender/non-distended. No guarding/reflex.  Musculoskeletal: M/S 5/5 throughout.  Extremities without ischemic changes.  No deformity or atrophy. No edema. Neurologic: Sensation grossly intact in extremities.  Symmetrical.  Speech is fluent. Motor exam as listed above. Psychiatric: Judgment intact, Mood & affect appropriate for pt's clinical situation. Dermatologic: No rashes or ulcers noted.  No cellulitis or open wounds. Lymph : No Cervical, Axillary, or Inguinal lymphadenopathy.  CBC Lab Results  Component Value Date   WBC 6.3 01/19/2021   HGB 13.6 02/17/2021   HCT 40.0 02/17/2021   MCV 90.9 01/19/2021   PLT 183 01/19/2021   BMET    Component Value Date/Time   NA 140 03/12/2021 0854   NA 143 02/27/2018 1520   K 3.5 03/12/2021 0854   CL  101 03/12/2021 0854   CO2 28 03/12/2021 0854   GLUCOSE 102 (H) 03/12/2021 0854   BUN 16 03/12/2021 0854   BUN 17 02/27/2018 1520   CREATININE 1.04 03/12/2021 0854   CREATININE 0.65 05/14/2013 0851   CALCIUM 8.9  03/12/2021 0854   GFRNONAA >60 01/19/2021 1423   GFRAA >60 11/13/2019 2229   Estimated Creatinine Clearance: 70 mL/min (by C-G formula based on SCr of 1.04 mg/dL).  COAG Lab Results  Component Value Date   INR 1.10 08/08/2018   INR 1.05 04/30/2017   INR 1.1 10/11/2016   Radiology ECHOCARDIOGRAM COMPLETE  Result Date: 03/08/2021    ECHOCARDIOGRAM REPORT   Patient Name:   ALYSHIA KERNAN Date of Exam: 03/08/2021 Medical Rec #:  [id number]        Height:       65.0 in Accession #:    [id number]       Weight:       286.0 lb Date of Birth:  Oct 14, 1950        BSA:          2.303 m Patient Age:    6 years         BP:           134/78 mmHg Patient Gender: F                HR:           55 bpm. Exam Location:  [facility] Procedure: 2D Echo, Color Doppler, Cardiac Doppler and Strain Analysis Indications:     Chemo Z09  History:         Patient has prior history of Echocardiogram examinations, most                  recent 07/27/2016. Risk Factors:Hypertension. Mitral valve                  regurgitation.  Sonographer:     Sherrie Sport Referring Phys:  [id number] [place] YU Diagnosing Phys: Kathlyn Sacramento MD  Sonographer Comments: Global longitudinal strain was attempted. IMPRESSIONS  1. Left ventricular ejection fraction, by estimation, is 60 to 65%. The left ventricle has normal function. Left ventricular endocardial border not optimally defined to evaluate regional wall motion. There is mild left ventricular hypertrophy. Left ventricular diastolic parameters are consistent with Grade I diastolic dysfunction (impaired relaxation). The average left ventricular global longitudinal strain is -16.3 %. The global longitudinal strain is normal.  2. Right ventricular systolic function is normal. The right ventricular size is normal.  3. Left atrial size was mildly dilated.  4. The mitral valve is normal in structure. Trivial mitral valve regurgitation. No evidence of mitral stenosis.  5. The aortic valve is normal in structure.  Aortic valve regurgitation is not visualized. No aortic stenosis is present. FINDINGS  Left Ventricle: Left ventricular ejection fraction, by estimation, is 60 to 65%. The left ventricle has normal function. Left ventricular endocardial border not optimally defined to evaluate regional wall motion. The average left ventricular global longitudinal strain is -16.3 %. The global longitudinal strain is normal. The left ventricular internal cavity size was normal in size. There is mild left ventricular hypertrophy. Left ventricular diastolic parameters are consistent with Grade I diastolic dysfunction (impaired relaxation). Right Ventricle: The right ventricular size is normal. No increase in right ventricular wall thickness. Right ventricular systolic function is normal. Left Atrium: Left atrial size was mildly dilated. Right Atrium: Right atrial size was normal in size. Pericardium: There  is no evidence of pericardial effusion. Mitral Valve: The mitral valve is normal in structure. Mild mitral annular calcification. Trivial mitral valve regurgitation. No evidence of mitral valve stenosis. Tricuspid Valve: The tricuspid valve is normal in structure. Tricuspid valve regurgitation is mild . No evidence of tricuspid stenosis. Aortic Valve: The aortic valve is normal in structure. Aortic valve regurgitation is not visualized. No aortic stenosis is present. Aortic valve mean gradient measures 4.7 mmHg. Aortic valve peak gradient measures 8.4 mmHg. Aortic valve area, by VTI measures 1.88 cm. Pulmonic Valve: The pulmonic valve was normal in structure. Pulmonic valve regurgitation is mild. No evidence of pulmonic stenosis. Aorta: The aortic root is normal in size and structure. Venous: The inferior vena cava was not well visualized. IAS/Shunts: No atrial level shunt detected by color flow Doppler.  LEFT VENTRICLE PLAX 2D LVIDd:         4.30 cm  Diastology LVIDs:         2.61 cm  LV e' medial:    5.87 cm/s LV PW:         1.92  cm  LV E/e' medial:  12.5 LV IVS:        0.97 cm  LV e' lateral:   9.68 cm/s LVOT diam:     2.00 cm  LV E/e' lateral: 7.6 LV SV:         63 LV SV Index:   28       2D Longitudinal Strain LVOT Area:     3.14 cm 2D Strain GLS Avg:     -16.3 %                          3D Volume EF:                         3D EF:        60 %                         LV EDV:       274 ml                         LV ESV:       110 ml                         LV SV:        165 ml RIGHT VENTRICLE RV Basal diam:  2.92 cm RV S prime:     13.30 cm/s TAPSE (M-mode): 4.5 cm LEFT ATRIUM           Index       RIGHT ATRIUM          Index LA diam:      4.30 cm 1.87 cm/m  RA Area:     9.16 cm LA Vol (A2C): 29.7 ml 12.90 ml/m RA Volume:   15.60 ml 6.77 ml/m LA Vol (A4C): 69.1 ml 30.01 ml/m  AORTIC VALVE                    PULMONIC VALVE AV Area (Vmax):    1.85 cm     PV Vmax:        0.63 m/s AV Area (Vmean):   1.88 cm     PV Peak grad:   1.6 mmHg AV Area (VTI):  1.88 cm     RVOT Peak grad: 3 mmHg AV Vmax:           145.00 cm/s AV Vmean:          100.433 cm/s AV VTI:            0.338 m AV Peak Grad:      8.4 mmHg AV Mean Grad:      4.7 mmHg LVOT Vmax:         85.50 cm/s LVOT Vmean:        60.000 cm/s LVOT VTI:          0.202 m LVOT/AV VTI ratio: 0.60  AORTA Ao Root diam: 3.57 cm MITRAL VALVE               TRICUSPID VALVE MV Area (PHT): 2.63 cm    TR Peak grad:   24.6 mmHg MV Decel Time: 288 msec    TR Vmax:        248.00 cm/s MV E velocity: 73.30 cm/s MV A velocity: 88.30 cm/s  SHUNTS MV E/A ratio:  0.83        Systemic VTI:  0.20 m                            Systemic Diam: 2.00 cm Kathlyn Sacramento MD Electronically signed by Kathlyn Sacramento MD Signature Date/Time: 03/08/2021/1:46:14 PM    Final     Assessment/Plan The patient is a 70 year old female with multiple medical issues including recent diagnosis of right-sided breast cancer status post lumpectomy / sentinel node biopsy who presented for scheduled Port-A-Cath insertion today  1.   Breast Cancer: Patient with recent diagnosis of breast cancer s/p lumpectomy and sentinel node biopsy.  Patient will be starting chemotherapy soon and was scheduled to undergo Port-A-Cath placement today.  Unfortunately, the first attempt at placement was unsuccessful and vascular surgery was asked to also try to place the port.  The patient was still under anesthesia so I reached out to her family member Stan Head who consented.  Procedure, risks and benefits were explained to the patient representative.  All questions were answered.  Will plan on placement today.  2.  Atrial Fibrillation Patient is on Eliquis as an outpatient  3. GERD: On nexium as outpatient Asymptomatic today  Discussed with Dr. Francene Castle, PA-C  03/23/2021 2:36 PM  This note was created with Dragon medical transcription system.  Any error is purely unintentional.

## 2021-03-23 NOTE — Progress Notes (Signed)
[facility] POST-OP OFFICE VISIT  03/23/2021  HPI: Amber Barnes is a 70 y.o. female s/p right breast lumpectomy and SLNBx.  She has no complaints, denies any numbness or axillary pain.  Some periincisional numbness as expected.  Denies any history of bruising or ecchymosis.  Anticipation of chemotherapy, desiring for placement of Port-A-Cath.  Vital signs: There were no vitals taken for this visit.   Physical Exam: Constitutional: She appears well.  Skin: Right breast incision is clean, dry and intact.  There is no dermal edema, induration or erythema.  There is no mass-effect or evidence of seroma or hematoma present.  SURGICAL PATHOLOGY  CASE: [id number]  PATIENT: Amber Barnes  Surgical Pathology Report      Specimen Submitted:  A. Breast, right  B. Breast, right extra  C. Sentinel node, right axilla   Clinical History: Right breast cancer       DIAGNOSIS:  A. BREAST, RIGHT LATERAL; LUMPECTOMY:  - INVASIVE MAMMARY CARCINOMA, TWO SEPARATE FOCI.  - DUCTAL CARCINOMA IN SITU (DCIS).  - SEE CANCER SUMMARY BELOW.  - TWO BIOPSY SITES WITH VENUS AND X-SHAPED CLIPS.  - RF ID TAG IN PLACE.   B.  BREAST, RIGHT EXTRA; EXCISION:  - BENIGN BREAST TISSUE.  - NEGATIVE FOR MALIGNANCY.   C.  SENTINEL LYMPH NODE, RIGHT #1; EXCISION:  - ONE LYMPH NODE NEGATIVE FOR MALIGNANCY (0/1).   CANCER CASE SUMMARY: INVASIVE CARCINOMA OF THE BREAST  Standard(s): AJCC-UICC 8   SPECIMEN  Procedure: Lumpectomy  Specimen Laterality: Right   TUMOR  Histologic Type: Invasive carcinoma of no special type  Histologic Grade (Nottingham Histologic Score)                       Glandular (Acinar)/Tubular Differentiation: 3                       Nuclear Pleomorphism: 2                       Mitotic Rate: 3                       Overall Grade: 3  Tumor Size: Greatest dimension of largest invasive focus measures 10 mm  Ductal Carcinoma In Situ (DCIS): Present, nuclear  grade 2 with focal  comedonecrosis  Lymphovascular Invasion: Not identified  Treatment Effect in the Breast: No known presurgical therapy   MARGINS  Margin Status for Invasive Carcinoma: All margins negative for invasive  carcinoma                       Distance from closest margin: 2.1 mm                       Specify closest margin: Superior   Margin Status for DCIS: All margins negative for DCIS                       Distance from DCIS to closest margin: 3 mm                       Specify closest margin: Anterior    REGIONAL LYMPH NODES  Regional Lymph Node Status: All regional lymph nodes negative for tumor                         Total Number of Lymph Nodes Examined (sentinel and  non-sentinel): 1                       Number of Sentinel Nodes Examined: 1   DISTANT METASTASIS  Distant Site(s) Involved, if applicable: Not applicable   PATHOLOGIC STAGE CLASSIFICATION (pTNM, AJCC 8th Edition):  TNM Descriptors: (Multiple foci of invasive carcinoma)  pT1b  Regional Lymph Nodes Modifier: (sn)  pN0  pM - Not applicable   SPECIAL STUDIES  Breast Biomarker Testing Performed on Previous Biopsy: [id number] A1  (X-clip)  Estrogen Receptor (ER) Status: Negative  Progesterone Receptor (PgR) Status: Positive  HER2 (by immunohistochemistry): Positive   Breast biomarkers have been ordered on [id number] B1 (venus clip) and  are pending at the time of the sign-out of this case.   Comment:  Residual invasive carcinoma and DCIS are present at both of the previous  biopsy sites and the morphology is the same. At the venus clip, the  residual carcinoma measures 10 mm. Invasive carcinoma is 2.1 mm and DCIS  is 13 mm from the superior margin. At the site of the X clip the  residual carcinoma measures 3 mm.  Invasive carcinoma is 4 mm from the  posterior margin and DCIS is 3 mm to the anterior margin.  Assessment/Plan: This is a 70 y.o. female 13 days s/p right breast lumpectomy and  SLNBx.  Will begin chemotherapy later this week, presenting now for Port-A-Cath placement.  Patient Active Problem List   Diagnosis Date Noted   HER2-positive carcinoma of breast (HCC) 01/26/2021   Goals of care, counseling/discussion 01/26/2021   Malignant neoplasm of upper-outer quadrant of right female breast (HCC) 01/19/2021   Left arm pain 12/02/2020   Soft tissue mass 12/02/2020   Candidal intertrigo 09/09/2020   Urinary frequency 09/09/2020   Night sweat 03/31/2020   Nasal drainage 12/18/2019   Sinus congestion 12/18/2019   Nausea 10/30/2019   Major depression in remission (HCC) 08/27/2018   Primary osteoarthritis involving multiple joints 08/27/2018   Atrial fibrillation (HCC) 08/27/2018   COPD (chronic obstructive pulmonary disease) (HCC) 08/27/2018   BMI 50.0-59.9, adult (HCC) 07/16/2018   Morbid obesity with BMI of 50.0-59.9, adult (HCC) 05/31/2018   Bradycardia 05/22/2017   Restless leg syndrome 04/13/2017   OSA (obstructive sleep apnea) 01/09/2017   Fatty liver disease, nonalcoholic 06/30/2016   Family history of ovarian cancer 06/08/2016   GERD (gastroesophageal reflux disease) 05/05/2016   Hypertension 11/11/2014   Anxiety and depression 11/11/2014   Weight loss 11/11/2014    We discussed risk/benefits of Port-A-Cath placement and these include but are not limited to anesthesia, bleeding, infection, dysfunction, venous or arterial injury, pneumothorax.  I believe she understands and desires to proceed.  No guarantees were ever expressed or implied.     I will be glad to assist in her further care as needed.  Denny Rodenberg M.D., FACS 03/23/2021, 9:13 AM  

## 2021-03-23 NOTE — Op Note (Signed)
SURGICAL PROCEDURE REPORT  DATE OF PROCEDURE: 03/23/2021   ATTENDING SURGEON:  Ronny Bacon, M.D., FACS   ANESTHESIA: Local MAC  PRE-OPERATIVE DIAGNOSIS: Right breast cancer requiring durable central venous access for chemotherapy  POST-OPERATIVE DIAGNOSIS: Same.  PROCEDURE:   Aborted:  Insertion of left subclavian vein Bard PowerPort central venous catheter with subcutaneous port under fluoroscopic guidance   INTRAOPERATIVE FINDINGS: Ultrasound showing quickly responsive collapsing IJ low in the patient's left neck to respirations.  Unable to cannulate with guidewire.    FLUOROSCOPY: None utilized.   ESTIMATED BLOOD LOSS: Minimal (<10 mL)   SPECIMENS: None   IMPLANTS: None  DRAINS: None   COMPLICATIONS: None apparent   CONDITION AT COMPLETION: Hemodynamically stable, awake   DISPOSITION: PACU   INDICATION(S) FOR PROCEDURE:  Patient is a 70 y.o. female who presented with right breast cancer requiring durable central venous access for chemotherapy. All risks, benefits, and alternatives to above elective procedures were discussed with the patient, who elected to proceed, and informed consent was accordingly obtained at that time.  DETAILS OF PROCEDURE:  Patient was brought to the operative suite and appropriately identified. Right subclavian access site and planned port placement site were prepped and draped in the usual sterile fashion. Following a brief timeout, in Trendelenburg position, percutaneous Right subclavian venous access was attempted.   Multiple passes, never obtaining good venous return.  I believe due to a defect in the casing of the needle I did aspirate air but this was not from the patient.  I have noticed this defect in the junction of the needle to the syringe before. We obtained a different needle, 18-gauge spinal which would not allow the guidewire to pass, but utilized a 14-gauge Angiocath. Limited ultrasound guided attempt to the accessible portion  of the lower left neck and internal jugular, apparently accessing with Angiocath, however unable to comfortably/safely pass guidewire, or maintain consistent venous return. I then felt it wise to abort and consulted vascular surgery to attempt further.  Skin was cleaned, dried. Patient was then safely transferred to PACU for a chest x-ray.  I was present for all aspects of the procedures, and no intraprocedural complications were apparent.   Ronny Bacon, M.D., [facility] 03/23/2021 2:39 PM

## 2021-03-23 NOTE — Interval H&P Note (Signed)
History and Physical Interval Note:  03/23/2021 12:25 PM  Amber Barnes  has presented today for surgery, with the diagnosis of breast cancer.  The various methods of treatment have been discussed with the patient and family. After consideration of risks, benefits and other options for treatment, the patient has consented to  Procedure(s): INSERTION PORT-A-CATH (N/A) as a surgical intervention.  The patient's history has been reviewed, patient examined, no change in status, stable for surgery.  I have reviewed the patient's chart and labs.  Questions were answered to the patient's satisfaction.    The left side is marked.   Ronny Bacon, M.D., [facility]  03/23/2021 ; 12:25 PM  Ronny Bacon

## 2021-03-23 NOTE — H&P (View-Only) (Signed)
[facility] Vascular Consult Note  MRN : [id number]  Amber Barnes is a 70 y.o. ([birth year]) female who presents with chief complaint of schedule port-a-cath placement  History of Present Illness:  The patient is a 70 year old female with a past medical history of morbid obesity, anxiety, depression, GERD, fatty liver disease, OSA, atrial fibrillation, COPD, with a recent diagnosis of breast cancer who presents for a scheduled Port-A-Cath placement.  First attempt at Port-A-Cath placement by Dr. [name] today was unsuccessful and therefore vascular surgery was emergently consulted. Patient was still under anesthesia for this consult so consent was obtained from her family member.   Current Facility-Administered Medications  Medication Dose Route Frequency Provider Last Rate Last Admin   Chlorhexidine Gluconate Cloth 2 % PADS 6 each  6 each Topical Once Ronny Bacon, MD       And   Chlorhexidine Gluconate Cloth 2 % PADS 6 each  6 each Topical Once Ronny Bacon, MD       fentaNYL (SUBLIMAZE) injection 25-50 mcg  25-50 mcg Intravenous Q5 min PRN Lennox Pippins, MD   25 mcg at 03/23/21 1420   lactated ringers infusion   Intravenous Continuous [place] Ouch, MD   New Bag at 03/23/21 1249   meperidine (DEMEROL) injection 6.25-12.5 mg  6.25-12.5 mg Intravenous Q5 min PRN Lennox Pippins, MD       ondansetron [facility]) injection 4 mg  4 mg Intravenous Once PRN Lennox Pippins, MD       oxyCODONE (Oxy IR/ROXICODONE) immediate release tablet 5 mg  5 mg Oral Once PRN Lennox Pippins, MD       Or   oxyCODONE (ROXICODONE) 5 MG/5ML solution 5 mg  5 mg Oral Once PRN Lennox Pippins, MD       Past Medical History:  Diagnosis Date   Achilles tendinitis    Acute medial meniscal tear    Allergy    Anxiety    Atrial fibrillation ([place])    a. new onset 07/2016; b. CHADS2VASc --> 4 (HTN, female, age, CHF); c. eliquis   CHF (congestive heart failure)  (HCC)    Chronic anticoagulation    Apixaban   Chronic lower back pain    Colon polyp    Complication of anesthesia    Emergence delirium; hypoxia   COPD (chronic obstructive pulmonary disease) ([place])    Cystic breast    Depression    Endometriosis    Fibrocystic breast disease    GERD (gastroesophageal reflux disease)    History of stress test    a. 07/2016 MV: EF 55-65%. Small, mild defect  in mid anteroseptal, apical septal, and apical locations. No ischemia-->low risk.   Hyperlipidemia    Hypertension    Insomnia    Lipoma    Malignant neoplasm of upper-outer quadrant of right female breast (HCC)    Stage 1A invasive mammary carcinoma (cT1b or T1c N0); ER (-), PR (+), HER2/neu (+)   Migraine    Mild Mitral regurgitation    a. 07/2016 Echo: EF 55-65%, no rwma, mild MR. Mildly dil LA. Nl RV fx.    OSA (obstructive sleep apnea)    non-compliant with nocturnal PAP therapy   Osteoarthritis    left knee   Osteopenia    Peptic ulcer disease    a. H. pylori   Pneumonia    RLS (restless legs syndrome)    Tendonitis    Thyroid disease    Vitamin D deficiency    Past Surgical History:  Procedure Laterality Date   ABDOMINAL HYSTERECTOMY     ovaries left   ACHILLES TENDON SURGERY     2012,01/2017   BACK SURGERY     fusion lumbar   BREAST BIOPSY Right 01/15/2021   Affirm biopsy-"X" clip-INVASIVE MAMMARY CARCINOMA   BREAST BIOPSY Right 01/15/2021   u/s bx-"venus" clip INVASIVE MAMMARY CARCINOMA   BREAST BIOPSY     BREAST LUMPECTOMY,RADIO FREQ LOCALIZER,AXILLARY SENTINEL LYMPH NODE BIOPSY Right 02/17/2021   Procedure: BREAST LUMPECTOMY,RADIO FREQ LOCALIZER,AXILLARY SENTINEL LYMPH NODE BIOPSY;  Surgeon: Ronny Bacon, MD;  Location: [facility];  Service: General;  Laterality: Right;   CARDIOVERSION N/A 09/07/2016   Procedure: CARDIOVERSION;  Surgeon: Minna Merritts, MD;  Location: [facility];  Service: Cardiovascular;  Laterality: N/A;   CARDIOVERSION N/A 10/19/2016    Procedure: CARDIOVERSION;  Surgeon: Minna Merritts, MD;  Location: [facility];  Service: Cardiovascular;  Laterality: N/A;   CARDIOVERSION N/A 12/14/2016   Procedure: CARDIOVERSION;  Surgeon: Minna Merritts, MD;  Location: [facility];  Service: Cardiovascular;  Laterality: N/A;   COLONOSCOPY     COLONOSCOPY WITH PROPOFOL N/A 05/13/2020   Procedure: COLONOSCOPY WITH PROPOFOL;  Surgeon: Toledo, Benay Pike, MD;  Location: [facility];  Service: Gastroenterology;  Laterality: N/A;  ELIQUIS   ESOPHAGOGASTRODUODENOSCOPY     ESOPHAGOGASTRODUODENOSCOPY (EGD) WITH PROPOFOL N/A 05/13/2020   Procedure: ESOPHAGOGASTRODUODENOSCOPY (EGD) WITH PROPOFOL;  Surgeon: Toledo, Benay Pike, MD;  Location: [facility];  Service: Gastroenterology;  Laterality: N/A;   FOOT SURGERY Left    tendon   JOINT REPLACEMENT Right    hip   THYROIDECTOMY     thyroid lobectomy secondary to a benign nodule   TOTAL HIP ARTHROPLASTY Right 08/21/2018   Procedure: TOTAL HIP ARTHROPLASTY ANTERIOR APPROACH-RIGHT CELL SAVER REQUESTED;  Surgeon: [name], MD;  Location: [facility];  Service: Orthopedics;  Laterality: Right;   Social History Social History   Tobacco Use   Smoking status: Former    Packs/day: 0.25    Years: 10.00    Pack years: 2.50    Types: Cigarettes    Quit date: 2005    Years since quitting: 17.7   Smokeless tobacco: Never   Tobacco comments:    smoked for 10 years, 1 pack every 2-3 days  Vaping Use   Vaping Use: Never used  Substance Use Topics   Alcohol use: Not Currently    Alcohol/week: 1.0 standard drink    Types: 1 Glasses of wine per week    Comment: rarely   Drug use: No   Family History Family History  Problem Relation Age of Onset   Cancer Sister 46       ovarian   Depression Sister    Cancer Brother        CLL   Cancer Father 32       colon   Heart disease Father    Heart attack Father 20       died from MI   Hyperlipidemia Father    Hypertension Father    Cancer  Mother        leukemia   Breast cancer Paternal Aunt        43's   Migraines Neg Hx   Family member denies peripheral artery disease, venous disease or renal disease.  Allergies  Allergen Reactions   Prednisone Other (See Comments)    Agitation, hyperactivity, polyphagia   Amiodarone Nausea And Vomiting   Hydrocodone-Acetaminophen Other (See Comments)    Hallucinations and combative   Oxycodone  Itching    Can take it with benadryl   Tapentadol Itching   Tramadol Itching    Can take it with benadryl   Adhesive [Tape] Itching and Rash    Prefers paper tape   Latex Itching and Rash    use paper tap   REVIEW OF SYSTEMS (Negative unless checked)  Constitutional: [] Weight loss  [] Fever  [] Chills Cardiac: [] Chest pain   [] Chest pressure   [] Palpitations   [] Shortness of breath when laying flat   [] Shortness of breath at rest   [] Shortness of breath with exertion. Vascular:  [] Pain in legs with walking   [] Pain in legs at rest   [] Pain in legs when laying flat   [] Claudication   [] Pain in feet when walking  [] Pain in feet at rest  [] Pain in feet when laying flat   [] History of DVT   [] Phlebitis   [] Swelling in legs   [] Varicose veins   [] Non-healing ulcers Pulmonary:   [] Uses home oxygen   [] Productive cough   [] Hemoptysis   [] Wheeze  [x] COPD   [] Asthma Neurologic:  [] Dizziness  [] Blackouts   [] Seizures   [] History of stroke   [] History of TIA  [] Aphasia   [] Temporary blindness   [] Dysphagia   [] Weakness or numbness in arms   [] Weakness or numbness in legs Musculoskeletal:  [x] Arthritis   [] Joint swelling   [] Joint pain   [] Low back pain Hematologic:  [] Easy bruising  [] Easy bleeding   [] Hypercoagulable state   [] Anemic  [] Hepatitis Gastrointestinal:  [] Blood in stool   [] Vomiting blood  [x] Gastroesophageal reflux/heartburn   [] Difficulty swallowing. Genitourinary:  [] Chronic kidney disease   [] Difficult urination  [] Frequent urination  [] Burning with urination   [] Blood in urine Skin:   [] Rashes   [] Ulcers   [] Wounds Psychological:  [] History of anxiety   []  History of major depression.  Physical Examination  Vitals:   03/23/21 1019 03/23/21 1356 03/23/21 1400 03/23/21 1415  BP: (!) 152/76 118/62 120/69 129/82  Pulse: (!) 55 70 66 65  Resp: 16 12 15    Temp: (!) 97 F (36.1 C) 98 F (36.7 C)    TempSrc: Tympanic     SpO2: 100% 100% 100% 100%  Weight: 128.8 kg     Height: 5' 5"  (1.651 m)      Body mass index is 47.26 kg/m. Gen:  NAD Head: [place]/AT, No temporalis wasting. Prominent temp pulse not noted. Ear/Nose/Throat: Hearing grossly intact, nares w/o erythema or drainage, oropharynx w/o Erythema/Exudate Eyes: Sclera non-icteric, conjunctiva clear Neck: Trachea midline.  No JVD.  Pulmonary:  Good air movement, respirations not labored, equal bilaterally.  Cardiac: Irregularly irregular Vascular:  Vessel Right Left  Radial Palpable Palpable  Ulnar Palpable Palpable   Gastrointestinal: soft, non-tender/non-distended. No guarding/reflex.  Musculoskeletal: M/S 5/5 throughout.  Extremities without ischemic changes.  No deformity or atrophy. No edema. Neurologic: Sensation grossly intact in extremities.  Symmetrical.  Speech is fluent. Motor exam as listed above. Psychiatric: Judgment intact, Mood & affect appropriate for pt's clinical situation. Dermatologic: No rashes or ulcers noted.  No cellulitis or open wounds. Lymph : No Cervical, Axillary, or Inguinal lymphadenopathy.  CBC Lab Results  Component Value Date   WBC 6.3 01/19/2021   HGB 13.6 02/17/2021   HCT 40.0 02/17/2021   MCV 90.9 01/19/2021   PLT 183 01/19/2021   BMET    Component Value Date/Time   NA 140 03/12/2021 0854   NA 143 02/27/2018 1520   K 3.5 03/12/2021 0854   CL  101 03/12/2021 0854   CO2 28 03/12/2021 0854   GLUCOSE 102 (H) 03/12/2021 0854   BUN 16 03/12/2021 0854   BUN 17 02/27/2018 1520   CREATININE 1.04 03/12/2021 0854   CREATININE 0.65 05/14/2013 0851   CALCIUM 8.9  03/12/2021 0854   GFRNONAA >60 01/19/2021 1423   GFRAA >60 11/13/2019 2229   Estimated Creatinine Clearance: 70 mL/min (by C-G formula based on SCr of 1.04 mg/dL).  COAG Lab Results  Component Value Date   INR 1.10 08/08/2018   INR 1.05 04/30/2017   INR 1.1 10/11/2016   Radiology ECHOCARDIOGRAM COMPLETE  Result Date: 03/08/2021    ECHOCARDIOGRAM REPORT   Patient Name:   Amber Barnes Date of Exam: 03/08/2021 Medical Rec #:  [id number]        Height:       65.0 in Accession #:    [id number]       Weight:       286.0 lb Date of Birth:  1951-01-21        BSA:          2.303 m Patient Age:    78 years         BP:           134/78 mmHg Patient Gender: F                HR:           55 bpm. Exam Location:  [facility] Procedure: 2D Echo, Color Doppler, Cardiac Doppler and Strain Analysis Indications:     Chemo Z09  History:         Patient has prior history of Echocardiogram examinations, most                  recent 07/27/2016. Risk Factors:Hypertension. Mitral valve                  regurgitation.  Sonographer:     Sherrie Sport Referring Phys:  [id number] [place] YU Diagnosing Phys: Kathlyn Sacramento MD  Sonographer Comments: Global longitudinal strain was attempted. IMPRESSIONS  1. Left ventricular ejection fraction, by estimation, is 60 to 65%. The left ventricle has normal function. Left ventricular endocardial border not optimally defined to evaluate regional wall motion. There is mild left ventricular hypertrophy. Left ventricular diastolic parameters are consistent with Grade I diastolic dysfunction (impaired relaxation). The average left ventricular global longitudinal strain is -16.3 %. The global longitudinal strain is normal.  2. Right ventricular systolic function is normal. The right ventricular size is normal.  3. Left atrial size was mildly dilated.  4. The mitral valve is normal in structure. Trivial mitral valve regurgitation. No evidence of mitral stenosis.  5. The aortic valve is normal in structure.  Aortic valve regurgitation is not visualized. No aortic stenosis is present. FINDINGS  Left Ventricle: Left ventricular ejection fraction, by estimation, is 60 to 65%. The left ventricle has normal function. Left ventricular endocardial border not optimally defined to evaluate regional wall motion. The average left ventricular global longitudinal strain is -16.3 %. The global longitudinal strain is normal. The left ventricular internal cavity size was normal in size. There is mild left ventricular hypertrophy. Left ventricular diastolic parameters are consistent with Grade I diastolic dysfunction (impaired relaxation). Right Ventricle: The right ventricular size is normal. No increase in right ventricular wall thickness. Right ventricular systolic function is normal. Left Atrium: Left atrial size was mildly dilated. Right Atrium: Right atrial size was normal in size. Pericardium: There  is no evidence of pericardial effusion. Mitral Valve: The mitral valve is normal in structure. Mild mitral annular calcification. Trivial mitral valve regurgitation. No evidence of mitral valve stenosis. Tricuspid Valve: The tricuspid valve is normal in structure. Tricuspid valve regurgitation is mild . No evidence of tricuspid stenosis. Aortic Valve: The aortic valve is normal in structure. Aortic valve regurgitation is not visualized. No aortic stenosis is present. Aortic valve mean gradient measures 4.7 mmHg. Aortic valve peak gradient measures 8.4 mmHg. Aortic valve area, by VTI measures 1.88 cm. Pulmonic Valve: The pulmonic valve was normal in structure. Pulmonic valve regurgitation is mild. No evidence of pulmonic stenosis. Aorta: The aortic root is normal in size and structure. Venous: The inferior vena cava was not well visualized. IAS/Shunts: No atrial level shunt detected by color flow Doppler.  LEFT VENTRICLE PLAX 2D LVIDd:         4.30 cm  Diastology LVIDs:         2.61 cm  LV e' medial:    5.87 cm/s LV PW:         1.92  cm  LV E/e' medial:  12.5 LV IVS:        0.97 cm  LV e' lateral:   9.68 cm/s LVOT diam:     2.00 cm  LV E/e' lateral: 7.6 LV SV:         63 LV SV Index:   28       2D Longitudinal Strain LVOT Area:     3.14 cm 2D Strain GLS Avg:     -16.3 %                          3D Volume EF:                         3D EF:        60 %                         LV EDV:       274 ml                         LV ESV:       110 ml                         LV SV:        165 ml RIGHT VENTRICLE RV Basal diam:  2.92 cm RV S prime:     13.30 cm/s TAPSE (M-mode): 4.5 cm LEFT ATRIUM           Index       RIGHT ATRIUM          Index LA diam:      4.30 cm 1.87 cm/m  RA Area:     9.16 cm LA Vol (A2C): 29.7 ml 12.90 ml/m RA Volume:   15.60 ml 6.77 ml/m LA Vol (A4C): 69.1 ml 30.01 ml/m  AORTIC VALVE                    PULMONIC VALVE AV Area (Vmax):    1.85 cm     PV Vmax:        0.63 m/s AV Area (Vmean):   1.88 cm     PV Peak grad:   1.6 mmHg AV Area (VTI):  1.88 cm     RVOT Peak grad: 3 mmHg AV Vmax:           145.00 cm/s AV Vmean:          100.433 cm/s AV VTI:            0.338 m AV Peak Grad:      8.4 mmHg AV Mean Grad:      4.7 mmHg LVOT Vmax:         85.50 cm/s LVOT Vmean:        60.000 cm/s LVOT VTI:          0.202 m LVOT/AV VTI ratio: 0.60  AORTA Ao Root diam: 3.57 cm MITRAL VALVE               TRICUSPID VALVE MV Area (PHT): 2.63 cm    TR Peak grad:   24.6 mmHg MV Decel Time: 288 msec    TR Vmax:        248.00 cm/s MV E velocity: 73.30 cm/s MV A velocity: 88.30 cm/s  SHUNTS MV E/A ratio:  0.83        Systemic VTI:  0.20 m                            Systemic Diam: 2.00 cm Kathlyn Sacramento MD Electronically signed by Kathlyn Sacramento MD Signature Date/Time: 03/08/2021/1:46:14 PM    Final     Assessment/Plan The patient is a 70 year old female with multiple medical issues including recent diagnosis of right-sided breast cancer status post lumpectomy / sentinel node biopsy who presented for scheduled Port-A-Cath insertion today  1.   Breast Cancer: Patient with recent diagnosis of breast cancer s/p lumpectomy and sentinel node biopsy.  Patient will be starting chemotherapy soon and was scheduled to undergo Port-A-Cath placement today.  Unfortunately, the first attempt at placement was unsuccessful and vascular surgery was asked to also try to place the port.  The patient was still under anesthesia so I reached out to her family member Stan Head who consented.  Procedure, risks and benefits were explained to the patient representative.  All questions were answered.  Will plan on placement today.  2.  Atrial Fibrillation Patient is on Eliquis as an outpatient  3. GERD: On nexium as outpatient Asymptomatic today  Discussed with Dr. Francene Castle, PA-C  03/23/2021 2:36 PM  This note was created with Dragon medical transcription system.  Any error is purely unintentional.

## 2021-03-23 NOTE — Interval H&P Note (Signed)
History and Physical Interval Note:  03/23/2021 5:30 PM  Amber Barnes  has presented today for surgery, with the diagnosis of Cancer.  The various methods of treatment have been discussed with the patient and family. After consideration of risks, benefits and other options for treatment, the patient has consented to  Procedure(s): PORTA CATH INSERTION (N/A) as a surgical intervention.  The patient's history has been reviewed, patient examined, no change in status, stable for surgery.  I have reviewed the patient's chart and labs.  Questions were answered to the patient's satisfaction.     Hortencia Pilar

## 2021-03-24 ENCOUNTER — Inpatient Hospital Stay: Payer: Medicare Other

## 2021-03-24 ENCOUNTER — Inpatient Hospital Stay (HOSPITAL_BASED_OUTPATIENT_CLINIC_OR_DEPARTMENT_OTHER): Payer: Medicare Other | Admitting: Oncology

## 2021-03-24 ENCOUNTER — Encounter: Payer: Self-pay | Admitting: Vascular Surgery

## 2021-03-24 VITALS — BP 144/64 | HR 59 | Temp 97.5°F | Resp 16

## 2021-03-24 VITALS — BP 133/75 | HR 66 | Temp 96.7°F | Resp 16 | Wt 284.0 lb

## 2021-03-24 DIAGNOSIS — F329 Major depressive disorder, single episode, unspecified: Secondary | ICD-10-CM | POA: Diagnosis not present

## 2021-03-24 DIAGNOSIS — E559 Vitamin D deficiency, unspecified: Secondary | ICD-10-CM | POA: Diagnosis not present

## 2021-03-24 DIAGNOSIS — C50919 Malignant neoplasm of unspecified site of unspecified female breast: Secondary | ICD-10-CM

## 2021-03-24 DIAGNOSIS — Z95828 Presence of other vascular implants and grafts: Secondary | ICD-10-CM | POA: Diagnosis not present

## 2021-03-24 DIAGNOSIS — Z806 Family history of leukemia: Secondary | ICD-10-CM | POA: Diagnosis not present

## 2021-03-24 DIAGNOSIS — C50411 Malignant neoplasm of upper-outer quadrant of right female breast: Secondary | ICD-10-CM | POA: Diagnosis not present

## 2021-03-24 DIAGNOSIS — Z171 Estrogen receptor negative status [ER-]: Secondary | ICD-10-CM | POA: Insufficient documentation

## 2021-03-24 DIAGNOSIS — G629 Polyneuropathy, unspecified: Secondary | ICD-10-CM | POA: Diagnosis not present

## 2021-03-24 DIAGNOSIS — Z7901 Long term (current) use of anticoagulants: Secondary | ICD-10-CM | POA: Diagnosis not present

## 2021-03-24 DIAGNOSIS — T451X5A Adverse effect of antineoplastic and immunosuppressive drugs, initial encounter: Secondary | ICD-10-CM | POA: Insufficient documentation

## 2021-03-24 DIAGNOSIS — J449 Chronic obstructive pulmonary disease, unspecified: Secondary | ICD-10-CM | POA: Insufficient documentation

## 2021-03-24 DIAGNOSIS — R112 Nausea with vomiting, unspecified: Secondary | ICD-10-CM | POA: Insufficient documentation

## 2021-03-24 DIAGNOSIS — Z79899 Other long term (current) drug therapy: Secondary | ICD-10-CM | POA: Insufficient documentation

## 2021-03-24 DIAGNOSIS — M1990 Unspecified osteoarthritis, unspecified site: Secondary | ICD-10-CM | POA: Diagnosis not present

## 2021-03-24 DIAGNOSIS — G2581 Restless legs syndrome: Secondary | ICD-10-CM | POA: Insufficient documentation

## 2021-03-24 DIAGNOSIS — Z5111 Encounter for antineoplastic chemotherapy: Secondary | ICD-10-CM | POA: Diagnosis not present

## 2021-03-24 DIAGNOSIS — R634 Abnormal weight loss: Secondary | ICD-10-CM | POA: Diagnosis not present

## 2021-03-24 DIAGNOSIS — I10 Essential (primary) hypertension: Secondary | ICD-10-CM | POA: Diagnosis not present

## 2021-03-24 DIAGNOSIS — G4730 Sleep apnea, unspecified: Secondary | ICD-10-CM | POA: Insufficient documentation

## 2021-03-24 DIAGNOSIS — E785 Hyperlipidemia, unspecified: Secondary | ICD-10-CM | POA: Insufficient documentation

## 2021-03-24 DIAGNOSIS — I4891 Unspecified atrial fibrillation: Secondary | ICD-10-CM | POA: Insufficient documentation

## 2021-03-24 DIAGNOSIS — I110 Hypertensive heart disease with heart failure: Secondary | ICD-10-CM | POA: Diagnosis not present

## 2021-03-24 DIAGNOSIS — I509 Heart failure, unspecified: Secondary | ICD-10-CM | POA: Insufficient documentation

## 2021-03-24 DIAGNOSIS — Z86010 Personal history of colonic polyps: Secondary | ICD-10-CM | POA: Insufficient documentation

## 2021-03-24 DIAGNOSIS — K219 Gastro-esophageal reflux disease without esophagitis: Secondary | ICD-10-CM | POA: Insufficient documentation

## 2021-03-24 DIAGNOSIS — M5412 Radiculopathy, cervical region: Secondary | ICD-10-CM | POA: Diagnosis not present

## 2021-03-24 DIAGNOSIS — Z803 Family history of malignant neoplasm of breast: Secondary | ICD-10-CM | POA: Insufficient documentation

## 2021-03-24 LAB — COMPREHENSIVE METABOLIC PANEL
ALT: 14 U/L (ref 0–44)
AST: 27 U/L (ref 15–41)
Albumin: 3.9 g/dL (ref 3.5–5.0)
Alkaline Phosphatase: 75 U/L (ref 38–126)
Anion gap: 13 (ref 5–15)
BUN: 16 mg/dL (ref 8–23)
CO2: 24 mmol/L (ref 22–32)
Calcium: 8.6 mg/dL — ABNORMAL LOW (ref 8.9–10.3)
Chloride: 101 mmol/L (ref 98–111)
Creatinine, Ser: 1.18 mg/dL — ABNORMAL HIGH (ref 0.44–1.00)
GFR, Estimated: 50 mL/min — ABNORMAL LOW (ref >60)
Glucose, Bld: 147 mg/dL — ABNORMAL HIGH (ref 70–99)
Potassium: 3.4 mmol/L — ABNORMAL LOW (ref 3.5–5.1)
Sodium: 138 mmol/L (ref 135–145)
Total Bilirubin: 0.7 mg/dL (ref 0.3–1.2)
Total Protein: 7.3 g/dL (ref 6.5–8.1)

## 2021-03-24 LAB — CBC WITH DIFFERENTIAL/PLATELET
Abs Immature Granulocytes: 0.03 10*3/uL (ref 0.00–0.07)
Basophils Absolute: 0 10*3/uL (ref 0.0–0.1)
Basophils Relative: 0 %
Eosinophils Absolute: 0 10*3/uL (ref 0.0–0.5)
Eosinophils Relative: 0 %
HCT: 38.9 % (ref 36.0–46.0)
Hemoglobin: 13.6 g/dL (ref 12.0–15.0)
Immature Granulocytes: 0 %
Lymphocytes Relative: 9 %
Lymphs Abs: 0.8 10*3/uL (ref 0.7–4.0)
MCH: 31.6 pg (ref 26.0–34.0)
MCHC: 35 g/dL (ref 30.0–36.0)
MCV: 90.5 fL (ref 80.0–100.0)
Monocytes Absolute: 0.2 10*3/uL (ref 0.1–1.0)
Monocytes Relative: 3 %
Neutro Abs: 7.9 10*3/uL — ABNORMAL HIGH (ref 1.7–7.7)
Neutrophils Relative %: 88 %
Platelets: 163 10*3/uL (ref 150–400)
RBC: 4.3 MIL/uL (ref 3.87–5.11)
RDW: 13.2 % (ref 11.5–15.5)
WBC: 9 10*3/uL (ref 4.0–10.5)
nRBC: 0 % (ref 0.0–0.2)

## 2021-03-24 MED ORDER — SODIUM CHLORIDE 0.9 % IV SOLN
Freq: Once | INTRAVENOUS | Status: AC
  Filled 2021-03-24: qty 250

## 2021-03-24 MED ORDER — DIPHENHYDRAMINE HCL 50 MG/ML IJ SOLN
50.0000 mg | Freq: Once | INTRAMUSCULAR | Status: AC
  Administered 2021-03-24: 50 mg via INTRAVENOUS
  Filled 2021-03-24: qty 1

## 2021-03-24 MED ORDER — HEPARIN SOD (PORK) LOCK FLUSH 100 UNIT/ML IV SOLN
500.0000 [IU] | Freq: Once | INTRAVENOUS | Status: AC | PRN
  Filled 2021-03-24: qty 5

## 2021-03-24 MED ORDER — SODIUM CHLORIDE 0.9 % IV SOLN
10.0000 mg | Freq: Once | INTRAVENOUS | Status: AC
  Administered 2021-03-24: 10 mg via INTRAVENOUS
  Filled 2021-03-24: qty 10

## 2021-03-24 MED ORDER — TRASTUZUMAB-ANNS CHEMO 150 MG IV SOLR
4.0000 mg/kg | Freq: Once | INTRAVENOUS | Status: AC
  Administered 2021-03-24: 525 mg via INTRAVENOUS
  Filled 2021-03-24: qty 25

## 2021-03-24 MED ORDER — HEPARIN SOD (PORK) LOCK FLUSH 100 UNIT/ML IV SOLN
INTRAVENOUS | Status: AC
  Administered 2021-03-24: 500 [IU]
  Filled 2021-03-24: qty 5

## 2021-03-24 MED ORDER — ACETAMINOPHEN 325 MG PO TABS
650.0000 mg | ORAL_TABLET | Freq: Once | ORAL | Status: AC
  Administered 2021-03-24: 650 mg via ORAL
  Filled 2021-03-24: qty 2

## 2021-03-24 MED ORDER — FAMOTIDINE 20 MG IN NS 100 ML IVPB
20.0000 mg | Freq: Once | INTRAVENOUS | Status: AC
  Administered 2021-03-24: 20 mg via INTRAVENOUS
  Filled 2021-03-24: qty 20

## 2021-03-24 MED ORDER — SODIUM CHLORIDE 0.9 % IV SOLN
80.0000 mg/m2 | Freq: Once | INTRAVENOUS | Status: AC
  Administered 2021-03-24: 198 mg via INTRAVENOUS
  Filled 2021-03-24: qty 33

## 2021-03-24 NOTE — Patient Instructions (Signed)
[facility]  Discharge Instructions: Thank you for choosing [facility] to provide your oncology and hematology care.  If you have a lab appointment with the [facility], please go directly to the [facility] and check in at the registration area.  Wear comfortable clothing and clothing appropriate for easy access to any Portacath or PICC line.   We strive to give you quality time with your provider. You may need to reschedule your appointment if you arrive late (15 or more minutes).  Arriving late affects you and other patients whose appointments are after yours.  Also, if you miss three or more appointments without notifying the office, you may be dismissed from the clinic at the provider's discretion.      For prescription refill requests, have your pharmacy contact our office and allow 72 hours for refills to be completed.    Today you received the following chemotherapy and/or immunotherapy agents Taxol & Kanjinti      To help prevent nausea and vomiting after your treatment, we encourage you to take your nausea medication as directed.  BELOW ARE SYMPTOMS THAT SHOULD BE REPORTED IMMEDIATELY: *FEVER GREATER THAN 100.4 F (38 C) OR HIGHER *CHILLS OR SWEATING *NAUSEA AND VOMITING THAT IS NOT CONTROLLED WITH YOUR NAUSEA MEDICATION *UNUSUAL SHORTNESS OF BREATH *UNUSUAL BRUISING OR BLEEDING *URINARY PROBLEMS (pain or burning when urinating, or frequent urination) *BOWEL PROBLEMS (unusual diarrhea, constipation, pain near the anus) TENDERNESS IN MOUTH AND THROAT WITH OR WITHOUT PRESENCE OF ULCERS (sore throat, sores in mouth, or a toothache) UNUSUAL RASH, SWELLING OR PAIN  UNUSUAL VAGINAL DISCHARGE OR ITCHING   Items with * indicate a potential emergency and should be followed up as soon as possible or go to the Emergency Department if any problems should occur.  Please show the CHEMOTHERAPY ALERT CARD or IMMUNOTHERAPY ALERT CARD at  check-in to the Emergency Department and triage nurse.  Should you have questions after your visit or need to cancel or reschedule your appointment, please contact [facility]  [phone] and follow the prompts.  Office hours are 8:00 a.m. to 4:30 p.m. Monday - Friday. Please note that voicemails left after 4:00 p.m. may not be returned until the following business day.  We are closed weekends and major holidays. You have access to a nurse at all times for urgent questions. Please call the main number to the clinic [phone] and follow the prompts.  For any non-urgent questions, you may also contact your provider using MyChart. We now offer e-Visits for anyone 65 and older to request care online for non-urgent symptoms. For details visit [email or web address].   Also download the MyChart app! Go to the app store, search "MyChart", open the app, select [facility], and log in with your MyChart username and password.  Due to Covid, a mask is required upon entering the hospital/clinic. If you do not have a mask, one will be given to you upon arrival. For doctor visits, patients may have 1 support person aged 3 or older with them. For treatment visits, patients cannot have anyone with them due to current Covid guidelines and our immunocompromised population.

## 2021-03-24 NOTE — Progress Notes (Signed)
Hematology/Oncology Consult note [facility] Telephone:([phone] [phone]   Patient Care Team: [name], FNP as PCP - General (Family Medicine) [name] [name], MD as PCP - Cardiology (Cardiology) Charletta Cousin, MD (Inactive) [name], [name], MD as Consulting Physician (Cardiology) Rico Junker, RN as Oncology Nurse Navigator  REFERRING PROVIDER: [name], FNP  CHIEF COMPLAINTS/REASON FOR VISIT:  Follow-up for stage I HER2 positive breast cancer  HISTORY OF PRESENTING ILLNESS:   Amber Barnes is a  70 y.o.  female with PMH listed below was seen in consultation at the request of  [name], FNP  for evaluation of weight loss, family history ovarian cancer. Patient reports a history of nausea vomiting, early satiety, dysphagia symptoms that started in March.  She has lost significant amount of weight as well. Patient weighed 340 pounds in February 2021, 309 pounds in May 2021 and currently she weighs 296 pounds today. Patient has tried Zofran and scopolamine patch which did not relieve her symptoms. 11/05/2019, CT abdomen pelvis showed chronic changes with out acute abnormality to correspond with the patient's symptoms. Patient has had right upper quadrant ultrasound done on 11/14/2019, which showed hepatic steatosis with no other acute abnormality. Patient was seen by gastroenterology Dr. [name] on 11/18/2019.  Patient was recommended to take PPI daily.  Added Carafate.  H. pylori testing came back positive and the patient was started on a course of tetracycline, metronidazole, Pepto-Bismol for H. pylori treatments.  Patient reports that she has been on the treatment for 7 days now.  Nausea is slightly better but still persistent.  She is following up with gastroenterology next week for discussion of EGD. Currently she is not able to tolerate colonoscopy prep due to the nausea.  Her stool was tested positive for occult  blood. She also reports night sweating which is a chronic symptom for her.   INTERVAL HISTORY Amber Barnes is a 70 y.o. female who has above history reviewed by me today presents to reestablish care for newly diagnosed right breast cancer.    Patient was last seen on 05/20/2020 for evaluation of weight loss, family history of ovarian cancer patient was referred to establish care with genetic counselor.  Patient eventually declined genetic testing.  12/30/2020, patient had a screening mammogram done which showed possible mass and asymmetry in the right breast. 01/08/2021, diagnostic mammogram of the right breast showed 2 adjacent irregular spiculated masses.  Larger 1 was 10 mm and a smaller mass measured 5 mm. 01/15/2021 stereotactic biopsy of the right breast smaller mass showed invasive carcinoma no special type, grade 2, DCIS present, LVI negative. Ultrasound guided biopsy of the larger mass showed invasive mammary carcinoma,   Patient has appointment with surgery Dr. [name] later this week.  She was referred to establish care with oncology for evaluation.  She was accompanied by daughter. She has no new complaints.  Family history of breast cancer: Denies Family history of other cancers: Patient has a strong family history of leukemia in her mother, colon cancer in her father at age of 47, ovarian cancer in her sister at age of 86, CLL in her brother  02/17/2021, patient is status post lumpectomy and sentinel lymph node biopsy Pathology showed invasive mammary carcinoma, 2 separate foci, DCIS, negative sentinel lymph node biopsy 0/1, grade 3, or margins negative for invasive carcinoma.  ER -, PR + [1-10%], HER2 +  pT1b(m) pN0   # Strong family history of cancer, Discussed again about  genetic testing. patient adamantly declined.  #  Intermittent dizziness which per patient is chronic for her due to the vertigo. #03/23/2021 Mediport by vascular surgeon  INTERVAL HISTORY Amber Barnes is a 70 y.o. female who has above history reviewed by me today presents for follow up visit for management of right side HER 2 positive breast cancer Problems and complaints are listed below: Patient was accompanied by daughter. History of CHF, 03/08/2021, echocardiogram showed LVEF 60-65%. Per patient, she has been seen by Dr. [name] team and was cleared for proceeding with chemotherapy.  Records is not available in current EMR.  Patient has a follow-up appointment with Dr. [name] next week. Today she has no new complaints.  Denies any chest pain, shortness shortness of breath, pain.  Fever or chills.  Review of Systems  Constitutional:  Positive for fatigue. Negative for appetite change, chills, fever and unexpected weight change.       Patient sits in the wheelchair  HENT:   Negative for hearing loss and voice change.   Eyes:  Negative for eye problems.  Respiratory:  Negative for chest tightness and cough.   Cardiovascular:  Negative for chest pain.  Gastrointestinal:  Negative for abdominal distention, abdominal pain and blood in stool.  Endocrine: Negative for hot flashes.  Genitourinary:  Negative for difficulty urinating and frequency.   Musculoskeletal:  Negative for arthralgias.  Skin:  Negative for itching and rash.  Neurological:  Positive for dizziness and numbness. Negative for extremity weakness.  Hematological:  Negative for adenopathy.  Psychiatric/Behavioral:  Negative for confusion.    MEDICAL HISTORY:  Past Medical History:  Diagnosis Date   Achilles tendinitis    Acute medial meniscal tear    Allergy    Anxiety    Atrial fibrillation ([place])    a. new onset 07/2016; b. CHADS2VASc --> 4 (HTN, female, age, CHF); c. eliquis   CHF (congestive heart failure) (HCC)    Chronic anticoagulation    Apixaban   Chronic lower back pain    Colon polyp    Complication of anesthesia    Emergence delirium; hypoxia   COPD (chronic obstructive pulmonary disease) ([place])     Cystic breast    Depression    Endometriosis    Fibrocystic breast disease    GERD (gastroesophageal reflux disease)    History of stress test    a. 07/2016 MV: EF 55-65%. Small, mild defect  in mid anteroseptal, apical septal, and apical locations. No ischemia-->low risk.   Hyperlipidemia    Hypertension    Insomnia    Lipoma    Malignant neoplasm of upper-outer quadrant of right female breast (HCC)    Stage 1A invasive mammary carcinoma (cT1b or T1c N0); ER (-), PR (+), HER2/neu (+)   Migraine    Mild Mitral regurgitation    a. 07/2016 Echo: EF 55-65%, no rwma, mild MR. Mildly dil LA. Nl RV fx.    OSA (obstructive sleep apnea)    non-compliant with nocturnal PAP therapy   Osteoarthritis    left knee   Osteopenia    Peptic ulcer disease    a. H. pylori   Pneumonia    RLS (restless legs syndrome)    Tendonitis    Thyroid disease    Vitamin D deficiency     SURGICAL HISTORY: Past Surgical History:  Procedure Laterality Date   ABDOMINAL HYSTERECTOMY     ovaries left   ACHILLES TENDON SURGERY     2012,01/2017   BACK SURGERY  fusion lumbar   BREAST BIOPSY Right 01/15/2021   Affirm biopsy-"X" clip-INVASIVE MAMMARY CARCINOMA   BREAST BIOPSY Right 01/15/2021   u/s bx-"venus" clip INVASIVE MAMMARY CARCINOMA   BREAST BIOPSY     BREAST LUMPECTOMY,RADIO FREQ LOCALIZER,AXILLARY SENTINEL LYMPH NODE BIOPSY Right 02/17/2021   Procedure: BREAST LUMPECTOMY,RADIO FREQ LOCALIZER,AXILLARY SENTINEL LYMPH NODE BIOPSY;  Surgeon: Ronny Bacon, MD;  Location: [facility];  Service: General;  Laterality: Right;   CARDIOVERSION N/A 09/07/2016   Procedure: CARDIOVERSION;  Surgeon: Minna Merritts, MD;  Location: [facility];  Service: Cardiovascular;  Laterality: N/A;   CARDIOVERSION N/A 10/19/2016   Procedure: CARDIOVERSION;  Surgeon: Minna Merritts, MD;  Location: [facility];  Service: Cardiovascular;  Laterality: N/A;   CARDIOVERSION N/A 12/14/2016   Procedure: CARDIOVERSION;  Surgeon:  Minna Merritts, MD;  Location: [facility];  Service: Cardiovascular;  Laterality: N/A;   COLONOSCOPY     COLONOSCOPY WITH PROPOFOL N/A 05/13/2020   Procedure: COLONOSCOPY WITH PROPOFOL;  Surgeon: Toledo, Benay Pike, MD;  Location: [facility];  Service: Gastroenterology;  Laterality: N/A;  ELIQUIS   ESOPHAGOGASTRODUODENOSCOPY     ESOPHAGOGASTRODUODENOSCOPY (EGD) WITH PROPOFOL N/A 05/13/2020   Procedure: ESOPHAGOGASTRODUODENOSCOPY (EGD) WITH PROPOFOL;  Surgeon: Toledo, Benay Pike, MD;  Location: [facility];  Service: Gastroenterology;  Laterality: N/A;   FOOT SURGERY Left    tendon   JOINT REPLACEMENT Right    hip   PORTA CATH INSERTION N/A 03/23/2021   Procedure: PORTA CATH INSERTION;  Surgeon: Katha Cabal, MD;  Location: [facility];  Service: Cardiovascular;  Laterality: N/A;   PORTACATH PLACEMENT N/A 03/23/2021   Procedure: ATTEMPTED INSERTION PORT-A-CATH;  Surgeon: Ronny Bacon, MD;  Location: [facility];  Service: General;  Laterality: N/A;   THYROIDECTOMY     thyroid lobectomy secondary to a benign nodule   TOTAL HIP ARTHROPLASTY Right 08/21/2018   Procedure: TOTAL HIP ARTHROPLASTY ANTERIOR APPROACH-RIGHT CELL SAVER REQUESTED;  Surgeon: [name], MD;  Location: [facility];  Service: Orthopedics;  Laterality: Right;    SOCIAL HISTORY: Social History   Socioeconomic History   Marital status: Married    Spouse name: jeffrey   Number of children: 1   Years of education: Not on file   Highest education level: High school graduate  Occupational History    Comment: disabled  Tobacco Use   Smoking status: Former    Packs/day: 0.25    Years: 10.00    Pack years: 2.50    Types: Cigarettes    Quit date: 2005    Years since quitting: 17.7   Smokeless tobacco: Never   Tobacco comments:    smoked for 10 years, 1 pack every 2-3 days  Vaping Use   Vaping Use: Never used  Substance and Sexual Activity   Alcohol use: Not Currently    Alcohol/week: 1.0  standard drink    Types: 1 Glasses of wine per week    Comment: rarely   Drug use: No   Sexual activity: Not Currently  Other Topics Concern   Not on file  Social History Narrative   Moved to [place] from [place], originally from [place].    1 cup of caffeine daily   Right handed          Social Determinants of Health   Financial Resource Strain: Low Risk    Difficulty of Paying Living Expenses: Not very hard  Food Insecurity: No Food Insecurity   Worried About Running Out of Food in the Last Year:  Never true   Ran Out of Food in the Last Year: Never true  Transportation Needs: No Transportation Needs   Lack of Transportation (Medical): No   Lack of Transportation (Non-Medical): No  Physical Activity: Unknown   Days of Exercise per Week: 0 days   Minutes of Exercise per Session: Not on file  Stress: Stress Concern Present   Feeling of Stress : Rather much  Social Connections: Moderately Isolated   Frequency of Communication with Friends and Family: Never   Frequency of Social Gatherings with Friends and Family: Once a week   Attends Religious Services: More than 4 times per year   Active Member of [employer] or Organizations: No   Attends Music therapist: Never   Marital Status: Married  Human resources officer Violence: Not At Risk   Fear of Current or Ex-Partner: No   Emotionally Abused: No   Physically Abused: No   Sexually Abused: No    FAMILY HISTORY: Family History  Problem Relation Age of Onset   Cancer Sister 17       ovarian   Depression Sister    Cancer Brother        CLL   Cancer Father 84       colon   Heart disease Father    Heart attack Father 43       died from MI   Hyperlipidemia Father    Hypertension Father    Cancer Mother        leukemia   Breast cancer Paternal Aunt        54's   Migraines Neg Hx     ALLERGIES:  is allergic to prednisone, amiodarone, hydrocodone-acetaminophen, oxycodone, tapentadol,  tramadol, adhesive [tape], and latex.  MEDICATIONS:  Current Outpatient Medications  Medication Sig Dispense Refill   acetaminophen (TYLENOL) 500 MG tablet Take 500 mg by mouth every 6 (six) hours as needed. Taking as needed     albuterol (VENTOLIN HFA) 108 (90 Base) MCG/ACT inhaler INHALE 2 PUFFS INTO THE LUNGS EVERY 8 (EIGHT) HOURS AS NEEDED FOR WHEEZING OR SHORTNESS OF BREATH. 6.7 g 0   ALPRAZolam (XANAX) 0.25 MG tablet TAKE 1 TABLET BY MOUTH DAILY AS NEEDED FOR ANXIETY OR SLEEP. 30 tablet 2   azelastine (ASTELIN) 0.1 % nasal spray Place into both nostrils as needed for rhinitis. Use in each nostril as directed     B Complex Vitamins (VITAMIN B COMPLEX PO) Take 1 tablet by mouth daily.      bisoprolol (ZEBETA) 5 MG tablet TAKE 1 TABLET BY MOUTH IN THE MORNING AND MAY TAKE AN EXTRA IN THE EVENING IF NEEDED FOR HEART RATE 180 tablet 1   busPIRone (BUSPAR) 5 MG tablet TAKE 1 TABLET BY MOUTH THREE TIMES A DAY AS NEEDED 60 tablet 1   calcium gluconate 500 MG tablet Take 500 tablets by mouth daily.     calcium-vitamin D (OSCAL WITH D) 500-200 MG-UNIT tablet Take 1 tablet by mouth daily with breakfast.     Cholecalciferol (VITAMIN D3) 5000 units CAPS Take 5,000 Units by mouth daily.     DULoxetine (CYMBALTA) 30 MG capsule Take two tablets by mouth for a total of 60 MG daily. (Patient taking differently: Take 30 mg by mouth 2 (two) times daily.) 180 capsule 2   ELIQUIS 5 MG TABS tablet TAKE 1 TABLET BY MOUTH TWICE A DAY 180 tablet 1   esomeprazole (NEXIUM) 20 MG capsule Take 20 mg by mouth as needed.     furosemide (  LASIX) 20 MG tablet TAKE 1 TABLET BY MOUTH TWICE A DAY 180 tablet 0   hydrochlorothiazide (MICROZIDE) 12.5 MG capsule TAKE 1 CAPSULE BY MOUTH EVERY DAY 90 capsule 2   ibuprofen (ADVIL) 800 MG tablet Take 1 tablet (800 mg total) by mouth every 8 (eight) hours as needed. 30 tablet 0   lidocaine-prilocaine (EMLA) cream Apply to affected area once 30 g 3   losartan (COZAAR) 100 MG tablet  TAKE 1 TABLET BY MOUTH DAILY. PLEASE CALL OFFICE TO SCHEDULE AN APPOINTMENT FOR FURTHER REFILLS 90 tablet 0   Multiple Vitamin (MULTIVITAMIN) tablet Take 1 tablet by mouth daily.     potassium chloride SA (KLOR-CON) 20 MEQ tablet Take 1 tablet (20 mEq total) by mouth 2 (two) times daily. 120 tablet 2   prochlorperazine (COMPAZINE) 10 MG tablet Take 1 tablet (10 mg total) by mouth every 6 (six) hours as needed (Nausea or vomiting). 30 tablet 1   ranolazine (RANEXA) 500 MG 12 hr tablet TAKE 1 TABLET BY MOUTH TWICE A DAY 180 tablet 3   rizatriptan (MAXALT) 10 MG tablet TAKE 1 TABLET (10 MG TOTAL) BY MOUTH AS NEEDED FOR MIGRAINE. MAY REPEAT IN 2 HOURS IF NEEDED 10 tablet 0   rOPINIRole (REQUIP) 1 MG tablet TAKE 1 TABLET BY MOUTH AT BEDTIME. 90 tablet 1   rosuvastatin (CRESTOR) 20 MG tablet TAKE 1 TABLET BY MOUTH EVERY DAY 90 tablet 0   No current facility-administered medications for this visit.   Facility-Administered Medications Ordered in Other Visits  Medication Dose Route Frequency Provider Last Rate Last Admin   heparin lock flush 100 unit/mL  500 Units Intracatheter Once PRN [name], MD       PACLitaxel (TAXOL) 198 mg in sodium chloride 0.9 % 250 mL chemo infusion (</= 70m/m2)  80 mg/m2 (Treatment Plan Recorded) Intravenous Once YEarlie Server MD       trastuzumab-anns ([facility] 525 mg in sodium chloride 0.9 % 250 mL chemo infusion  4 mg/kg (Treatment Plan Recorded) Intravenous Once YEarlie Server MD 183.3 mL/hr at 03/24/21 1104 525 mg at 03/24/21 1104     PHYSICAL EXAMINATION: ECOG PERFORMANCE STATUS: 2 - Symptomatic, <50% confined to bed Vitals:   03/24/21 0850  BP: 133/75  Pulse: 66  Resp: 16  Temp: (!) 96.7 F (35.9 C)  SpO2: 96%   Filed Weights   03/24/21 0850  Weight: 284 lb (128.8 kg)    Physical Exam Constitutional:      General: She is not in acute distress.    Appearance: She is obese.  HENT:     Head: Normocephalic and atraumatic.  Eyes:     General: No scleral  icterus. Cardiovascular:     Rate and Rhythm: Normal rate and regular rhythm.     Heart sounds: Normal heart sounds.  Pulmonary:     Effort: Pulmonary effort is normal. No respiratory distress.     Breath sounds: No wheezing.  Abdominal:     General: Bowel sounds are normal. There is no distension.     Palpations: Abdomen is soft.  Musculoskeletal:        General: No deformity. Normal range of motion.     Cervical back: Normal range of motion and neck supple.  Skin:    General: Skin is warm and dry.     Findings: No erythema or rash.  Neurological:     Mental Status: She is alert and oriented to person, place, and time. Mental status is at baseline.  Cranial Nerves: No cranial nerve deficit.     Coordination: Coordination normal.  Psychiatric:        Mood and Affect: Mood normal.   Status post Mediport placement.  Patient has bruising/ecchymosis around the site of procedure. Marland Kitchen LABORATORY DATA:  I have reviewed the data as listed Lab Results  Component Value Date   WBC 9.0 03/24/2021   HGB 13.6 03/24/2021   HCT 38.9 03/24/2021   MCV 90.5 03/24/2021   PLT 163 03/24/2021   Recent Labs    03/25/20 0802 09/22/20 0950 01/19/21 1423 02/17/21 0853 02/26/21 0912 03/12/21 0854 03/24/21 0808  NA 138   < > 138   < > 140 140 138  K 4.4   < > 3.5   < > 3.4* 3.5 3.4*  CL 100   < > 98   < > 100 101 101  CO2 27   < > 29  --  28 28 24   GLUCOSE 82   < > 91   < > 89 102* 147*  BUN 19   < > 14   < > 19 16 16   CREATININE 0.83   < > 0.73   < > 1.06 1.04 1.18*  CALCIUM 9.0   < > 8.8*  --  9.0 8.9 8.6*  GFRNONAA  --   --  >60  --   --   --  50*  PROT 6.9  --  8.1  --   --   --  7.3  ALBUMIN 4.1  --  4.1  --   --   --  3.9  AST 20  --  21  --   --   --  27  ALT 22  --  17  --   --   --  14  ALKPHOS 79  --  75  --   --   --  75  BILITOT 0.7  --  0.8  --   --   --  0.7   < > = values in this interval not displayed.    Iron/TIBC/Ferritin/ %Sat    Component Value Date/Time    IRON 109 05/21/2020 1059   FERRITIN 34.5 05/21/2020 1059   IRONPCTSAT 25.3 05/21/2020 1059      RADIOGRAPHIC STUDIES: I have personally reviewed the radiological images as listed and agreed with the findings in the report. PERIPHERAL VASCULAR CATHETERIZATION  Result Date: 03/23/2021 See surgical note for result.  DG Chest Port 1 View  Result Date: 03/23/2021 CLINICAL DATA:  Pneumothorax EXAM: PORTABLE CHEST 1 VIEW COMPARISON:  11/05/2019 FINDINGS: Lung volumes are small but are symmetric and are clear. No pneumothorax or pleural effusion. Mild cardiomegaly is stable. Pulmonary vascularity is normal. IMPRESSION: No pneumothorax. Stable cardiomegaly Electronically Signed   By: Fidela Salisbury M.D.   On: 03/23/2021 14:35   ECHOCARDIOGRAM COMPLETE  Result Date: 03/08/2021    ECHOCARDIOGRAM REPORT   Patient Name:   Amber Barnes Date of Exam: 03/08/2021 Medical Rec #:  [id number]        Height:       65.0 in Accession #:    [id number]       Weight:       286.0 lb Date of Birth:  03-06-1951        BSA:          2.303 m Patient Age:    68 years         BP:  134/78 mmHg Patient Gender: F                HR:           55 bpm. Exam Location:  [facility] Procedure: 2D Echo, Color Doppler, Cardiac Doppler and Strain Analysis Indications:     Chemo Z09  History:         Patient has prior history of Echocardiogram examinations, most                  recent 07/27/2016. Risk Factors:Hypertension. Mitral valve                  regurgitation.  Sonographer:     Sherrie Sport Referring Phys:  [id number] Martinez Viola Kinnick Diagnosing Phys: Kathlyn Sacramento MD  Sonographer Comments: Global longitudinal strain was attempted. IMPRESSIONS  1. Left ventricular ejection fraction, by estimation, is 60 to 65%. The left ventricle has normal function. Left ventricular endocardial border not optimally defined to evaluate regional wall motion. There is mild left ventricular hypertrophy. Left ventricular diastolic parameters are consistent with  Grade I diastolic dysfunction (impaired relaxation). The average left ventricular global longitudinal strain is -16.3 %. The global longitudinal strain is normal.  2. Right ventricular systolic function is normal. The right ventricular size is normal.  3. Left atrial size was mildly dilated.  4. The mitral valve is normal in structure. Trivial mitral valve regurgitation. No evidence of mitral stenosis.  5. The aortic valve is normal in structure. Aortic valve regurgitation is not visualized. No aortic stenosis is present. FINDINGS  Left Ventricle: Left ventricular ejection fraction, by estimation, is 60 to 65%. The left ventricle has normal function. Left ventricular endocardial border not optimally defined to evaluate regional wall motion. The average left ventricular global longitudinal strain is -16.3 %. The global longitudinal strain is normal. The left ventricular internal cavity size was normal in size. There is mild left ventricular hypertrophy. Left ventricular diastolic parameters are consistent with Grade I diastolic dysfunction (impaired relaxation). Right Ventricle: The right ventricular size is normal. No increase in right ventricular wall thickness. Right ventricular systolic function is normal. Left Atrium: Left atrial size was mildly dilated. Right Atrium: Right atrial size was normal in size. Pericardium: There is no evidence of pericardial effusion. Mitral Valve: The mitral valve is normal in structure. Mild mitral annular calcification. Trivial mitral valve regurgitation. No evidence of mitral valve stenosis. Tricuspid Valve: The tricuspid valve is normal in structure. Tricuspid valve regurgitation is mild . No evidence of tricuspid stenosis. Aortic Valve: The aortic valve is normal in structure. Aortic valve regurgitation is not visualized. No aortic stenosis is present. Aortic valve mean gradient measures 4.7 mmHg. Aortic valve peak gradient measures 8.4 mmHg. Aortic valve area, by VTI measures  1.88 cm. Pulmonic Valve: The pulmonic valve was normal in structure. Pulmonic valve regurgitation is mild. No evidence of pulmonic stenosis. Aorta: The aortic root is normal in size and structure. Venous: The inferior vena cava was not well visualized. IAS/Shunts: No atrial level shunt detected by color flow Doppler.  LEFT VENTRICLE PLAX 2D LVIDd:         4.30 cm  Diastology LVIDs:         2.61 cm  LV e' medial:    5.87 cm/s LV PW:         1.92 cm  LV E/e' medial:  12.5 LV IVS:        0.97 cm  LV e' lateral:   9.68 cm/s LVOT  diam:     2.00 cm  LV E/e' lateral: 7.6 LV SV:         63 LV SV Index:   28       2D Longitudinal Strain LVOT Area:     3.14 cm 2D Strain GLS Avg:     -16.3 %                          3D Volume EF:                         3D EF:        60 %                         LV EDV:       274 ml                         LV ESV:       110 ml                         LV SV:        165 ml RIGHT VENTRICLE RV Basal diam:  2.92 cm RV S prime:     13.30 cm/s TAPSE (M-mode): 4.5 cm LEFT ATRIUM           Index       RIGHT ATRIUM          Index LA diam:      4.30 cm 1.87 cm/m  RA Area:     9.16 cm LA Vol (A2C): 29.7 ml 12.90 ml/m RA Volume:   15.60 ml 6.77 ml/m LA Vol (A4C): 69.1 ml 30.01 ml/m  AORTIC VALVE                    PULMONIC VALVE AV Area (Vmax):    1.85 cm     PV Vmax:        0.63 m/s AV Area (Vmean):   1.88 cm     PV Peak grad:   1.6 mmHg AV Area (VTI):     1.88 cm     RVOT Peak grad: 3 mmHg AV Vmax:           145.00 cm/s AV Vmean:          100.433 cm/s AV VTI:            0.338 m AV Peak Grad:      8.4 mmHg AV Mean Grad:      4.7 mmHg LVOT Vmax:         85.50 cm/s LVOT Vmean:        60.000 cm/s LVOT VTI:          0.202 m LVOT/AV VTI ratio: 0.60  AORTA Ao Root diam: 3.57 cm MITRAL VALVE               TRICUSPID VALVE MV Area (PHT): 2.63 cm    TR Peak grad:   24.6 mmHg MV Decel Time: 288 msec    TR Vmax:        248.00 cm/s MV E velocity: 73.30 cm/s MV A velocity: 88.30 cm/s  SHUNTS MV E/A ratio:   0.83        Systemic VTI:  0.20 m  Systemic Diam: 2.00 cm Kathlyn Sacramento MD Electronically signed by Kathlyn Sacramento MD Signature Date/Time: 03/08/2021/1:46:14 PM    Final       ASSESSMENT & PLAN:  1. HER2-positive carcinoma of breast ([place])   2. Encounter for antineoplastic chemotherapy   3. Port-A-Cath in place   4. Neuropathy   Cancer Staging Malignant neoplasm of upper-outer quadrant of right female breast [facility]) Staging form: Breast, AJCC 8th Edition - Pathologic stage from 03/03/2021: Stage IA (pT1b, pN0, cM0, G3, ER-, PR+, HER2+) - Signed by [name], MD on 03/03/2021  #Right breast invasive carcinoma pT1b pN0 ER negative, PR weakly HER2 positive positive, HER 2 positive.  2 foci adjacent to each other. Stage 1 ER-, HER2 + disease,  Pathology report was independent reviewed by me and discussed with patient. Labs reviewed and discussed with patient Proceed with cycle 1 day 1 trastuzumab and Taxol.  Patient has history of CHF, Repeat echocardiogram showed normal LVEF. Follow-up with cardiologist Dr. [name].. Plan to repeat echocardiogram 3 months.  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  Monitor symptoms.  Supportive care measures are necessary for patient well-being and will be provided as necessary. We spent sufficient time to discuss many aspect of care, questions were answered to patient's satisfaction.   All questions were answered. The patient knows to call the clinic with any problems questions or concerns.  cc [name], FNP  Follow-up 1 week, Taxol and trastuzumab.   [name], MD, PhD Hematology Oncology [facility] Pager- [phone] 03/24/2021

## 2021-03-24 NOTE — Progress Notes (Signed)
Pt and daughter in clinic today.  Pt scheduled for first treatment today.  Pt had port a cath placed yesterday.

## 2021-03-25 ENCOUNTER — Telehealth: Payer: Self-pay | Admitting: Cardiovascular Disease

## 2021-03-25 ENCOUNTER — Other Ambulatory Visit: Payer: Self-pay | Admitting: Cardiovascular Disease

## 2021-03-25 ENCOUNTER — Telehealth: Payer: Self-pay

## 2021-03-25 ENCOUNTER — Other Ambulatory Visit: Payer: Self-pay | Admitting: Family

## 2021-03-25 DIAGNOSIS — G2581 Restless legs syndrome: Secondary | ICD-10-CM

## 2021-03-25 DIAGNOSIS — K760 Fatty (change of) liver, not elsewhere classified: Secondary | ICD-10-CM

## 2021-03-25 MED ORDER — FUROSEMIDE 20 MG PO TABS: 20.0000 mg | ORAL_TABLET | Freq: Two times a day (BID) | ORAL | 0 refills | Status: DC

## 2021-03-25 NOTE — Telephone Encounter (Signed)
Sig - Route: Take 1 tablet (20 mg total) by mouth 2 (two) times daily. - Oral   Sent to pharmacy as: furosemide (LASIX) 20 MG tablet   E-Prescribing Status: Receipt confirmed by pharmacy (03/25/2021  3:39 PM EDT)    Pharmacy  [facility] [place], [place]

## 2021-03-25 NOTE — Anesthesia Postprocedure Evaluation (Signed)
Anesthesia Post Note  Patient: Yared Herpin  Procedure(s) Performed: ATTEMPTED INSERTION PORT-A-CATH  Patient location during evaluation: PACU Anesthesia Type: General Level of consciousness: awake and alert Pain management: pain level controlled Vital Signs Assessment: post-procedure vital signs reviewed and stable Respiratory status: spontaneous breathing, nonlabored ventilation, respiratory function stable and patient connected to nasal cannula oxygen Cardiovascular status: blood pressure returned to baseline and stable Postop Assessment: no apparent nausea or vomiting Anesthetic complications: no   No notable events documented.   Last Vitals:  Vitals:   03/23/21 1730 03/23/21 1745  BP: (!) 141/85 (!) 147/78  Pulse: 66 67  Resp: 14 14  Temp:    SpO2: 97% 97%    Last Pain:  Vitals:   03/23/21 1745  TempSrc:   PainSc: 0-No pain                 Oaklyn Jakubek Doyne Keel

## 2021-03-25 NOTE — Telephone Encounter (Signed)
T/C to patient for follow up after receiving first chemo yesterday.   No answer but went to voice mail.   Mail box was full so unable to leave message.

## 2021-03-25 NOTE — Telephone Encounter (Signed)
*  STAT* If patient is at the pharmacy, call can be transferred to refill team.   1. Which medications need to be refilled? (please list name of each medication and dose if known) Lasix '20mg'$  1 tablet twice a day  2. Which pharmacy/location (including street and city if local pharmacy) is medication to be sent to? [facility] [place]  3. Do they need a 30 day or 90 day supply? 90 day

## 2021-03-31 ENCOUNTER — Inpatient Hospital Stay (HOSPITAL_BASED_OUTPATIENT_CLINIC_OR_DEPARTMENT_OTHER): Payer: Medicare Other | Admitting: Oncology

## 2021-03-31 ENCOUNTER — Other Ambulatory Visit: Payer: Self-pay

## 2021-03-31 ENCOUNTER — Inpatient Hospital Stay: Payer: Medicare Other

## 2021-03-31 ENCOUNTER — Encounter: Payer: Self-pay | Admitting: Oncology

## 2021-03-31 VITALS — BP 125/84 | HR 72 | Temp 97.9°F | Resp 18 | Wt 277.0 lb

## 2021-03-31 DIAGNOSIS — Z95828 Presence of other vascular implants and grafts: Secondary | ICD-10-CM | POA: Diagnosis not present

## 2021-03-31 DIAGNOSIS — R112 Nausea with vomiting, unspecified: Secondary | ICD-10-CM

## 2021-03-31 DIAGNOSIS — E559 Vitamin D deficiency, unspecified: Secondary | ICD-10-CM | POA: Diagnosis not present

## 2021-03-31 DIAGNOSIS — K219 Gastro-esophageal reflux disease without esophagitis: Secondary | ICD-10-CM | POA: Diagnosis not present

## 2021-03-31 DIAGNOSIS — G4730 Sleep apnea, unspecified: Secondary | ICD-10-CM | POA: Diagnosis not present

## 2021-03-31 DIAGNOSIS — T451X5A Adverse effect of antineoplastic and immunosuppressive drugs, initial encounter: Secondary | ICD-10-CM | POA: Diagnosis not present

## 2021-03-31 DIAGNOSIS — Z7901 Long term (current) use of anticoagulants: Secondary | ICD-10-CM | POA: Diagnosis not present

## 2021-03-31 DIAGNOSIS — C50919 Malignant neoplasm of unspecified site of unspecified female breast: Secondary | ICD-10-CM

## 2021-03-31 DIAGNOSIS — M5412 Radiculopathy, cervical region: Secondary | ICD-10-CM | POA: Diagnosis not present

## 2021-03-31 DIAGNOSIS — I509 Heart failure, unspecified: Secondary | ICD-10-CM | POA: Diagnosis not present

## 2021-03-31 DIAGNOSIS — I4891 Unspecified atrial fibrillation: Secondary | ICD-10-CM | POA: Diagnosis not present

## 2021-03-31 DIAGNOSIS — G629 Polyneuropathy, unspecified: Secondary | ICD-10-CM

## 2021-03-31 DIAGNOSIS — C50411 Malignant neoplasm of upper-outer quadrant of right female breast: Secondary | ICD-10-CM | POA: Diagnosis not present

## 2021-03-31 DIAGNOSIS — G2581 Restless legs syndrome: Secondary | ICD-10-CM | POA: Diagnosis not present

## 2021-03-31 DIAGNOSIS — Z86010 Personal history of colonic polyps: Secondary | ICD-10-CM | POA: Diagnosis not present

## 2021-03-31 DIAGNOSIS — R634 Abnormal weight loss: Secondary | ICD-10-CM | POA: Diagnosis not present

## 2021-03-31 DIAGNOSIS — I10 Essential (primary) hypertension: Secondary | ICD-10-CM | POA: Diagnosis not present

## 2021-03-31 DIAGNOSIS — Z5111 Encounter for antineoplastic chemotherapy: Secondary | ICD-10-CM | POA: Diagnosis not present

## 2021-03-31 DIAGNOSIS — Z79899 Other long term (current) drug therapy: Secondary | ICD-10-CM | POA: Diagnosis not present

## 2021-03-31 DIAGNOSIS — E785 Hyperlipidemia, unspecified: Secondary | ICD-10-CM | POA: Diagnosis not present

## 2021-03-31 DIAGNOSIS — Z171 Estrogen receptor negative status [ER-]: Secondary | ICD-10-CM | POA: Diagnosis not present

## 2021-03-31 DIAGNOSIS — Z806 Family history of leukemia: Secondary | ICD-10-CM | POA: Diagnosis not present

## 2021-03-31 DIAGNOSIS — I110 Hypertensive heart disease with heart failure: Secondary | ICD-10-CM | POA: Diagnosis not present

## 2021-03-31 DIAGNOSIS — J449 Chronic obstructive pulmonary disease, unspecified: Secondary | ICD-10-CM | POA: Diagnosis not present

## 2021-03-31 DIAGNOSIS — M1990 Unspecified osteoarthritis, unspecified site: Secondary | ICD-10-CM | POA: Diagnosis not present

## 2021-03-31 LAB — COMPREHENSIVE METABOLIC PANEL
ALT: 20 U/L (ref 0–44)
AST: 20 U/L (ref 15–41)
Albumin: 3.7 g/dL (ref 3.5–5.0)
Alkaline Phosphatase: 69 U/L (ref 38–126)
Anion gap: 9 (ref 5–15)
BUN: 22 mg/dL (ref 8–23)
CO2: 26 mmol/L (ref 22–32)
Calcium: 8.3 mg/dL — ABNORMAL LOW (ref 8.9–10.3)
Chloride: 100 mmol/L (ref 98–111)
Creatinine, Ser: 1.07 mg/dL — ABNORMAL HIGH (ref 0.44–1.00)
GFR, Estimated: 57 mL/min — ABNORMAL LOW (ref >60)
Glucose, Bld: 117 mg/dL — ABNORMAL HIGH (ref 70–99)
Potassium: 3.5 mmol/L (ref 3.5–5.1)
Sodium: 135 mmol/L (ref 135–145)
Total Bilirubin: 0.9 mg/dL (ref 0.3–1.2)
Total Protein: 7 g/dL (ref 6.5–8.1)

## 2021-03-31 LAB — CBC WITH DIFFERENTIAL/PLATELET
Abs Immature Granulocytes: 0.02 10*3/uL (ref 0.00–0.07)
Basophils Absolute: 0 10*3/uL (ref 0.0–0.1)
Basophils Relative: 1 %
Eosinophils Absolute: 0.2 10*3/uL (ref 0.0–0.5)
Eosinophils Relative: 4 %
HCT: 39.5 % (ref 36.0–46.0)
Hemoglobin: 13.5 g/dL (ref 12.0–15.0)
Immature Granulocytes: 1 %
Lymphocytes Relative: 50 %
Lymphs Abs: 1.9 10*3/uL (ref 0.7–4.0)
MCH: 30.8 pg (ref 26.0–34.0)
MCHC: 34.2 g/dL (ref 30.0–36.0)
MCV: 90.2 fL (ref 80.0–100.0)
Monocytes Absolute: 0.1 10*3/uL (ref 0.1–1.0)
Monocytes Relative: 3 %
Neutro Abs: 1.5 10*3/uL — ABNORMAL LOW (ref 1.7–7.7)
Neutrophils Relative %: 41 %
Platelets: 149 10*3/uL — ABNORMAL LOW (ref 150–400)
RBC: 4.38 MIL/uL (ref 3.87–5.11)
RDW: 12.6 % (ref 11.5–15.5)
Smear Review: NORMAL
WBC: 3.8 10*3/uL — ABNORMAL LOW (ref 4.0–10.5)
nRBC: 0 % (ref 0.0–0.2)

## 2021-03-31 LAB — IRON AND TIBC
Iron: 72 ug/dL (ref 28–170)
Saturation Ratios: 20 % (ref 10.4–31.8)
TIBC: 364 ug/dL (ref 250–450)
UIBC: 292 ug/dL

## 2021-03-31 LAB — FERRITIN: Ferritin: 291 ng/mL (ref 11–307)

## 2021-03-31 MED ORDER — HEPARIN SOD (PORK) LOCK FLUSH 100 UNIT/ML IV SOLN
500.0000 [IU] | Freq: Once | INTRAVENOUS | Status: AC | PRN
Start: 2021-03-31 — End: 2021-03-31
  Administered 2021-03-31: 500 [IU]
  Filled 2021-03-31: qty 5

## 2021-03-31 MED ORDER — FAMOTIDINE 20 MG IN NS 100 ML IVPB
20.0000 mg | Freq: Once | INTRAVENOUS | Status: AC
  Administered 2021-03-31: 20 mg via INTRAVENOUS
  Filled 2021-03-31: qty 20

## 2021-03-31 MED ORDER — SODIUM CHLORIDE 0.9% FLUSH
10.0000 mL | Freq: Once | INTRAVENOUS | Status: AC
  Administered 2021-03-31: 10 mL via INTRAVENOUS
  Filled 2021-03-31: qty 10

## 2021-03-31 MED ORDER — TRASTUZUMAB-ANNS CHEMO 150 MG IV SOLR
2.0000 mg/kg | Freq: Once | INTRAVENOUS | Status: AC
  Administered 2021-03-31: 252 mg via INTRAVENOUS
  Filled 2021-03-31: qty 12

## 2021-03-31 MED ORDER — SODIUM CHLORIDE 0.9 % IV SOLN
80.0000 mg/m2 | Freq: Once | INTRAVENOUS | Status: AC
  Administered 2021-03-31: 198 mg via INTRAVENOUS
  Filled 2021-03-31: qty 33

## 2021-03-31 MED ORDER — SODIUM CHLORIDE 0.9 % IV SOLN
10.0000 mg | Freq: Once | INTRAVENOUS | Status: AC
  Administered 2021-03-31: 10 mg via INTRAVENOUS
  Filled 2021-03-31: qty 10

## 2021-03-31 MED ORDER — ONDANSETRON HCL 8 MG PO TABS: 8.0000 mg | ORAL_TABLET | Freq: Three times a day (TID) | ORAL | 0 refills | Status: DC | PRN

## 2021-03-31 MED ORDER — HEPARIN SOD (PORK) LOCK FLUSH 100 UNIT/ML IV SOLN
INTRAVENOUS | Status: AC
  Filled 2021-03-31: qty 5

## 2021-03-31 MED ORDER — LORAZEPAM 0.5 MG PO TABS: 0.5000 mg | ORAL_TABLET | Freq: Three times a day (TID) | ORAL | 0 refills | Status: DC | PRN

## 2021-03-31 MED ORDER — SODIUM CHLORIDE 0.9 % IV SOLN
Freq: Once | INTRAVENOUS | Status: AC
  Filled 2021-03-31: qty 250

## 2021-03-31 MED ORDER — ACETAMINOPHEN 325 MG PO TABS
650.0000 mg | ORAL_TABLET | Freq: Once | ORAL | Status: AC
  Administered 2021-03-31: 650 mg via ORAL
  Filled 2021-03-31: qty 2

## 2021-03-31 MED ORDER — DIPHENHYDRAMINE HCL 50 MG/ML IJ SOLN
25.0000 mg | Freq: Once | INTRAMUSCULAR | Status: AC
  Administered 2021-03-31: 25 mg via INTRAVENOUS
  Filled 2021-03-31: qty 1

## 2021-03-31 NOTE — Progress Notes (Signed)
Hematology/Oncology Consult note [facility] [phone] [phone]   Patient Care Team: [name], FNP as PCP - General (Family Medicine) [name] [name], MD as PCP - Cardiology (Cardiology) Charletta Cousin, MD (Inactive) [name], [name], MD as Consulting Physician (Cardiology) Rico Junker, RN as Oncology Nurse Navigator  REFERRING PROVIDER: [name], FNP  CHIEF COMPLAINTS/REASON FOR VISIT:  Follow-up for stage I HER2 positive breast cancer  HISTORY OF PRESENTING ILLNESS:   Amber Barnes is a  70 y.o.  female with PMH listed below was seen in consultation at the request of  [name], FNP  for evaluation of weight loss, family history ovarian cancer. Patient reports a history of nausea vomiting, early satiety, dysphagia symptoms that started in March.  She has lost significant amount of weight as well. Patient weighed 340 pounds in February 2021, 309 pounds in May 2021 and currently she weighs 296 pounds today. Patient has tried Zofran and scopolamine patch which did not relieve her symptoms. 11/05/2019, CT abdomen pelvis showed chronic changes with out acute abnormality to correspond with the patient's symptoms. Patient has had right upper quadrant ultrasound done on 11/14/2019, which showed hepatic steatosis with no other acute abnormality. Patient was seen by gastroenterology Dr. [name] on 11/18/2019.  Patient was recommended to take PPI daily.  Added Carafate.  H. pylori testing came back positive and the patient was started on a course of tetracycline, metronidazole, Pepto-Bismol for H. pylori treatments.  Patient reports that she has been on the treatment for 7 days now.  Nausea is slightly better but still persistent.  She is following up with gastroenterology next week for discussion of EGD. Currently she is not able to tolerate colonoscopy prep due to the nausea.  Her stool was tested positive for occult  blood. She also reports night sweating which is a chronic symptom for her. Patient was last seen on 05/20/2020 for evaluation of weight loss, family history of ovarian cancer patient was referred to establish care with genetic counselor.  Patient eventually declined genetic testing.  12/30/2020, patient had a screening mammogram done which showed possible mass and asymmetry in the right breast. 01/08/2021, diagnostic mammogram of the right breast showed 2 adjacent irregular spiculated masses.  Larger 1 was 10 mm and a smaller mass measured 5 mm. 01/15/2021 stereotactic biopsy of the right breast smaller mass showed invasive carcinoma no special type, grade 2, DCIS present, LVI negative. Ultrasound guided biopsy of the larger mass showed invasive mammary carcinoma,   Patient has appointment with surgery Dr. [name] later this week.  She was referred to establish care with oncology for evaluation.  She was accompanied by daughter. She has no new complaints.  Family history of breast cancer: Denies Family history of other cancers: Patient has a strong family history of leukemia in her mother, colon cancer in her father at age of 85, ovarian cancer in her sister at age of 85, CLL in her brother  02/17/2021, patient is status post lumpectomy and sentinel lymph node biopsy Pathology showed invasive mammary carcinoma, 2 separate foci, DCIS, negative sentinel lymph node biopsy 0/1, grade 3, or margins negative for invasive carcinoma.  ER -, PR + [1-10%], HER2 +  pT1b(m) pN0   # Strong family history of cancer, Discussed again about genetic testing. patient adamantly declined.  #  Intermittent dizziness which per patient is chronic for her due to the vertigo. #03/23/2021 Mediport by vascular surgeon #History of CHF, 03/08/2021, echocardiogram showed  LVEF 60-65%. Per patient, she has been seen by Dr. [name] team and was cleared for proceeding with chemotherapy.  Records is not available in current  EMR.  INTERVAL HISTORY Amber Barnes is a 70 y.o. female who has above history reviewed by me today presents for follow up visit for management of right side HER 2 positive breast cancer S/p 1 cycle of Taxol and Transtuzumab She has nausea and vomiting. Decreased oral intake. She drinks water.  She has anxiety and take Xanax.  Lot 7 pounds.  Restless leg syndrome is worse.   Review of Systems  Constitutional:  Positive for fatigue. Negative for appetite change, chills, fever and unexpected weight change.       Patient sits in the wheelchair  HENT:   Negative for hearing loss and voice change.   Eyes:  Negative for eye problems.  Respiratory:  Negative for chest tightness and cough.   Cardiovascular:  Negative for chest pain.  Gastrointestinal:  Positive for nausea and vomiting. Negative for abdominal distention, abdominal pain and blood in stool.  Endocrine: Negative for hot flashes.  Genitourinary:  Negative for difficulty urinating and frequency.   Musculoskeletal:  Negative for arthralgias.  Skin:  Negative for itching and rash.  Neurological:  Positive for dizziness and numbness. Negative for extremity weakness.  Hematological:  Negative for adenopathy.  Psychiatric/Behavioral:  Negative for confusion.    MEDICAL HISTORY:  Past Medical History:  Diagnosis Date   Achilles tendinitis    Acute medial meniscal tear    Allergy    Anxiety    Atrial fibrillation ([place])    a. new onset 07/2016; b. CHADS2VASc --> 4 (HTN, female, age, CHF); c. eliquis   CHF (congestive heart failure) (HCC)    Chronic anticoagulation    Apixaban   Chronic lower back pain    Colon polyp    Complication of anesthesia    Emergence delirium; hypoxia   COPD (chronic obstructive pulmonary disease) ([place])    Cystic breast    Depression    Endometriosis    Fibrocystic breast disease    GERD (gastroesophageal reflux disease)    History of stress test    a. 07/2016 MV: EF 55-65%. Small, mild defect  in  mid anteroseptal, apical septal, and apical locations. No ischemia-->low risk.   Hyperlipidemia    Hypertension    Insomnia    Lipoma    Malignant neoplasm of upper-outer quadrant of right female breast (HCC)    Stage 1A invasive mammary carcinoma (cT1b or T1c N0); ER (-), PR (+), HER2/neu (+)   Migraine    Mild Mitral regurgitation    a. 07/2016 Echo: EF 55-65%, no rwma, mild MR. Mildly dil LA. Nl RV fx.    OSA (obstructive sleep apnea)    non-compliant with nocturnal PAP therapy   Osteoarthritis    left knee   Osteopenia    Peptic ulcer disease    a. H. pylori   Pneumonia    RLS (restless legs syndrome)    Tendonitis    Thyroid disease    Vitamin D deficiency     SURGICAL HISTORY: Past Surgical History:  Procedure Laterality Date   ABDOMINAL HYSTERECTOMY     ovaries left   ACHILLES TENDON SURGERY     2012,01/2017   BACK SURGERY     fusion lumbar   BREAST BIOPSY Right 01/15/2021   Affirm biopsy-"X" clip-INVASIVE MAMMARY CARCINOMA   BREAST BIOPSY Right 01/15/2021   u/s bx-"venus" clip INVASIVE MAMMARY CARCINOMA  BREAST BIOPSY     BREAST LUMPECTOMY,RADIO FREQ LOCALIZER,AXILLARY SENTINEL LYMPH NODE BIOPSY Right 02/17/2021   Procedure: BREAST LUMPECTOMY,RADIO FREQ LOCALIZER,AXILLARY SENTINEL LYMPH NODE BIOPSY;  Surgeon: Ronny Bacon, MD;  Location: [facility];  Service: General;  Laterality: Right;   CARDIOVERSION N/A 09/07/2016   Procedure: CARDIOVERSION;  Surgeon: Minna Merritts, MD;  Location: [facility];  Service: Cardiovascular;  Laterality: N/A;   CARDIOVERSION N/A 10/19/2016   Procedure: CARDIOVERSION;  Surgeon: Minna Merritts, MD;  Location: [facility];  Service: Cardiovascular;  Laterality: N/A;   CARDIOVERSION N/A 12/14/2016   Procedure: CARDIOVERSION;  Surgeon: Minna Merritts, MD;  Location: [facility];  Service: Cardiovascular;  Laterality: N/A;   COLONOSCOPY     COLONOSCOPY WITH PROPOFOL N/A 05/13/2020   Procedure: COLONOSCOPY WITH PROPOFOL;  Surgeon:  Toledo, Benay Pike, MD;  Location: [facility];  Service: Gastroenterology;  Laterality: N/A;  ELIQUIS   ESOPHAGOGASTRODUODENOSCOPY     ESOPHAGOGASTRODUODENOSCOPY (EGD) WITH PROPOFOL N/A 05/13/2020   Procedure: ESOPHAGOGASTRODUODENOSCOPY (EGD) WITH PROPOFOL;  Surgeon: Toledo, Benay Pike, MD;  Location: [facility];  Service: Gastroenterology;  Laterality: N/A;   FOOT SURGERY Left    tendon   JOINT REPLACEMENT Right    hip   PORTA CATH INSERTION N/A 03/23/2021   Procedure: PORTA CATH INSERTION;  Surgeon: Katha Cabal, MD;  Location: [place] [facility];  Service: Cardiovascular;  Laterality: N/A;   PORTACATH PLACEMENT N/A 03/23/2021   Procedure: ATTEMPTED INSERTION PORT-A-CATH;  Surgeon: Ronny Bacon, MD;  Location: [facility];  Service: General;  Laterality: N/A;   THYROIDECTOMY     thyroid lobectomy secondary to a benign nodule   TOTAL HIP ARTHROPLASTY Right 08/21/2018   Procedure: TOTAL HIP ARTHROPLASTY ANTERIOR APPROACH-RIGHT CELL SAVER REQUESTED;  Surgeon: [name], MD;  Location: [facility];  Service: Orthopedics;  Laterality: Right;    SOCIAL HISTORY: Social History   Socioeconomic History   Marital status: Married    Spouse name: jeffrey   Number of children: 1   Years of education: Not on file   Highest education level: High school graduate  Occupational History    Comment: disabled  Tobacco Use   Smoking status: Former    Packs/day: 0.25    Years: 10.00    Pack years: 2.50    Types: Cigarettes    Quit date: 2005    Years since quitting: 17.7   Smokeless tobacco: Never   Tobacco comments:    smoked for 10 years, 1 pack every 2-3 days  Vaping Use   Vaping Use: Never used  Substance and Sexual Activity   Alcohol use: Not Currently    Alcohol/week: 1.0 standard drink    Types: 1 Glasses of wine per week    Comment: rarely   Drug use: No   Sexual activity: Not Currently  Other Topics Concern   Not on file  Social History Narrative   Moved to  [place] from [place], originally from [place].    1 cup of caffeine daily   Right handed          Social Determinants of Health   Financial Resource Strain: Low Risk    Difficulty of Paying Living Expenses: Not very hard  Food Insecurity: No Food Insecurity   Worried About Charity fundraiser in the Last Year: Never true   Ran Out of Food in the Last Year: Never true  Transportation Needs: No Transportation Needs   Lack of Transportation (Medical): No   Lack of  Transportation (Non-Medical): No  Physical Activity: Unknown   Days of Exercise per Week: 0 days   Minutes of Exercise per Session: Not on file  Stress: Stress Concern Present   Feeling of Stress : Rather much  Social Connections: Moderately Isolated   Frequency of Communication with Friends and Family: Never   Frequency of Social Gatherings with Friends and Family: Once a week   Attends Religious Services: More than 4 times per year   Active Member of [employer] or Organizations: No   Attends Music therapist: Never   Marital Status: Married  Human resources officer Violence: Not At Risk   Fear of Current or Ex-Partner: No   Emotionally Abused: No   Physically Abused: No   Sexually Abused: No    FAMILY HISTORY: Family History  Problem Relation Age of Onset   Cancer Sister 36       ovarian   Depression Sister    Cancer Brother        CLL   Cancer Father 23       colon   Heart disease Father    Heart attack Father 18       died from MI   Hyperlipidemia Father    Hypertension Father    Cancer Mother        leukemia   Breast cancer Paternal Aunt        4's   Migraines Neg Hx     ALLERGIES:  is allergic to prednisone, amiodarone, hydrocodone-acetaminophen, oxycodone, tapentadol, tramadol, adhesive [tape], and latex.  MEDICATIONS:  Current Outpatient Medications  Medication Sig Dispense Refill   acetaminophen (TYLENOL) 500 MG tablet Take 500 mg by mouth every 6 (six) hours as  needed. Taking as needed     albuterol (VENTOLIN HFA) 108 (90 Base) MCG/ACT inhaler INHALE 2 PUFFS INTO THE LUNGS EVERY 8 (EIGHT) HOURS AS NEEDED FOR WHEEZING OR SHORTNESS OF BREATH. 6.7 g 0   ALPRAZolam (XANAX) 0.25 MG tablet TAKE 1 TABLET BY MOUTH DAILY AS NEEDED FOR ANXIETY OR SLEEP. 30 tablet 2   azelastine (ASTELIN) 0.1 % nasal spray Place into both nostrils as needed for rhinitis. Use in each nostril as directed     B Complex Vitamins (VITAMIN B COMPLEX PO) Take 1 tablet by mouth daily.      bisoprolol (ZEBETA) 5 MG tablet TAKE 1 TABLET BY MOUTH IN THE MORNING AND MAY TAKE AN EXTRA IN THE EVENING IF NEEDED FOR HEART RATE 180 tablet 1   busPIRone (BUSPAR) 5 MG tablet TAKE 1 TABLET BY MOUTH THREE TIMES A DAY AS NEEDED 60 tablet 1   calcium gluconate 500 MG tablet Take 500 tablets by mouth daily.     calcium-vitamin D (OSCAL WITH D) 500-200 MG-UNIT tablet Take 1 tablet by mouth daily with breakfast.     Cholecalciferol (VITAMIN D3) 5000 units CAPS Take 5,000 Units by mouth daily.     DULoxetine (CYMBALTA) 30 MG capsule Take two tablets by mouth for a total of 60 MG daily. (Patient taking differently: Take 30 mg by mouth 2 (two) times daily.) 180 capsule 2   ELIQUIS 5 MG TABS tablet TAKE 1 TABLET BY MOUTH TWICE A DAY 180 tablet 1   esomeprazole (NEXIUM) 20 MG capsule Take 20 mg by mouth as needed.     furosemide (LASIX) 20 MG tablet Take 1 tablet (20 mg total) by mouth 2 (two) times daily. 180 tablet 0   hydrochlorothiazide (MICROZIDE) 12.5 MG capsule TAKE 1 CAPSULE BY MOUTH  EVERY DAY 90 capsule 0   lidocaine-prilocaine (EMLA) cream Apply to affected area once 30 g 3   LORazepam (ATIVAN) 0.5 MG tablet Take 1 tablet (0.5 mg total) by mouth every 8 (eight) hours as needed for anxiety (nausea/ vomiting). 60 tablet 0   losartan (COZAAR) 100 MG tablet TAKE 1 TABLET BY MOUTH DAILY. PLEASE CALL OFFICE TO SCHEDULE AN APPOINTMENT FOR FURTHER REFILLS 90 tablet 0   Multiple Vitamin (MULTIVITAMIN) tablet  Take 1 tablet by mouth daily.     ondansetron (ZOFRAN) 8 MG tablet Take 1 tablet (8 mg total) by mouth every 8 (eight) hours as needed for nausea or vomiting. 60 tablet 0   potassium chloride SA (KLOR-CON) 20 MEQ tablet Take 1 tablet (20 mEq total) by mouth 2 (two) times daily. 120 tablet 2   prochlorperazine (COMPAZINE) 10 MG tablet Take 1 tablet (10 mg total) by mouth every 6 (six) hours as needed (Nausea or vomiting). 30 tablet 1   ranolazine (RANEXA) 500 MG 12 hr tablet TAKE 1 TABLET BY MOUTH TWICE A DAY 180 tablet 0   rizatriptan (MAXALT) 10 MG tablet TAKE 1 TABLET (10 MG TOTAL) BY MOUTH AS NEEDED FOR MIGRAINE. MAY REPEAT IN 2 HOURS IF NEEDED 10 tablet 0   rOPINIRole (REQUIP) 1 MG tablet TAKE 1 TABLET BY MOUTH AT BEDTIME. 90 tablet 1   rosuvastatin (CRESTOR) 20 MG tablet TAKE 1 TABLET BY MOUTH EVERY DAY 90 tablet 0   No current facility-administered medications for this visit.   Facility-Administered Medications Ordered in Other Visits  Medication Dose Route Frequency Provider Last Rate Last Admin   heparin lock flush 100 UNIT/ML injection              PHYSICAL EXAMINATION: ECOG PERFORMANCE STATUS: 2 - Symptomatic, <50% confined to bed Vitals:   03/31/21 0949  BP: 125/84  Pulse: 72  Resp: 18  Temp: 97.9 F (36.6 C)   Filed Weights   03/31/21 0949  Weight: 277 lb (125.6 kg)    Physical Exam Constitutional:      General: She is not in acute distress.    Appearance: She is obese.  HENT:     Head: Normocephalic and atraumatic.  Eyes:     General: No scleral icterus. Cardiovascular:     Rate and Rhythm: Normal rate and regular rhythm.     Heart sounds: Normal heart sounds.  Pulmonary:     Effort: Pulmonary effort is normal. No respiratory distress.     Breath sounds: No wheezing.  Abdominal:     General: Bowel sounds are normal. There is no distension.     Palpations: Abdomen is soft.  Musculoskeletal:        General: No deformity. Normal range of motion.      Cervical back: Normal range of motion and neck supple.  Skin:    General: Skin is warm and dry.     Findings: No erythema or rash.  Neurological:     Mental Status: She is alert and oriented to person, place, and time. Mental status is at baseline.     Cranial Nerves: No cranial nerve deficit.     Coordination: Coordination normal.  Psychiatric:        Mood and Affect: Mood normal.    . LABORATORY DATA:  I have reviewed the data as listed Lab Results  Component Value Date   WBC 3.8 (L) 03/31/2021   HGB 13.5 03/31/2021   HCT 39.5 03/31/2021   MCV 90.2 03/31/2021  PLT 149 (L) 03/31/2021   Recent Labs    01/19/21 1423 02/17/21 0853 03/12/21 0854 03/24/21 0808 03/31/21 0856  NA 138   < > 140 138 135  K 3.5   < > 3.5 3.4* 3.5  CL 98   < > 101 101 100  CO2 29   < > 28 24 26   GLUCOSE 91   < > 102* 147* 117*  BUN 14   < > 16 16 22   CREATININE 0.73   < > 1.04 1.18* 1.07*  CALCIUM 8.8*   < > 8.9 8.6* 8.3*  GFRNONAA >60  --   --  50* 57*  PROT 8.1  --   --  7.3 7.0  ALBUMIN 4.1  --   --  3.9 3.7  AST 21  --   --  27 20  ALT 17  --   --  14 20  ALKPHOS 75  --   --  75 69  BILITOT 0.8  --   --  0.7 0.9   < > = values in this interval not displayed.    Iron/TIBC/Ferritin/ %Sat    Component Value Date/Time   IRON 72 03/31/2021 0856   TIBC 364 03/31/2021 0856   FERRITIN 291 03/31/2021 0856   IRONPCTSAT 20 03/31/2021 0856      RADIOGRAPHIC STUDIES: I have personally reviewed the radiological images as listed and agreed with the findings in the report. PERIPHERAL VASCULAR CATHETERIZATION  Result Date: 03/23/2021 See surgical note for result.  DG Chest Port 1 View  Result Date: 03/23/2021 CLINICAL DATA:  Pneumothorax EXAM: PORTABLE CHEST 1 VIEW COMPARISON:  11/05/2019 FINDINGS: Lung volumes are small but are symmetric and are clear. No pneumothorax or pleural effusion. Mild cardiomegaly is stable. Pulmonary vascularity is normal. IMPRESSION: No pneumothorax. Stable  cardiomegaly Electronically Signed   By: Fidela Salisbury M.D.   On: 03/23/2021 14:35   ECHOCARDIOGRAM COMPLETE  Result Date: 03/08/2021    ECHOCARDIOGRAM REPORT   Patient Name:   Amber Barnes Date of Exam: 03/08/2021 Medical Rec #:  [id number]        Height:       65.0 in Accession #:    [id number]       Weight:       286.0 lb Date of Birth:  1950-11-29        BSA:          2.303 m Patient Age:    26 years         BP:           134/78 mmHg Patient Gender: F                HR:           55 bpm. Exam Location:  [facility] Procedure: 2D Echo, Color Doppler, Cardiac Doppler and Strain Analysis Indications:     Chemo Z09  History:         Patient has prior history of Echocardiogram examinations, most                  recent 07/27/2016. Risk Factors:Hypertension. Mitral valve                  regurgitation.  Sonographer:     Sherrie Sport Referring Phys:  [id number] [place] [name] Diagnosing Phys: Kathlyn Sacramento MD  Sonographer Comments: Global longitudinal strain was attempted. IMPRESSIONS  1. Left ventricular ejection fraction, by estimation, is 60 to 65%. The left ventricle has  normal function. Left ventricular endocardial border not optimally defined to evaluate regional wall motion. There is mild left ventricular hypertrophy. Left ventricular diastolic parameters are consistent with Grade I diastolic dysfunction (impaired relaxation). The average left ventricular global longitudinal strain is -16.3 %. The global longitudinal strain is normal.  2. Right ventricular systolic function is normal. The right ventricular size is normal.  3. Left atrial size was mildly dilated.  4. The mitral valve is normal in structure. Trivial mitral valve regurgitation. No evidence of mitral stenosis.  5. The aortic valve is normal in structure. Aortic valve regurgitation is not visualized. No aortic stenosis is present. FINDINGS  Left Ventricle: Left ventricular ejection fraction, by estimation, is 60 to 65%. The left ventricle has normal function.  Left ventricular endocardial border not optimally defined to evaluate regional wall motion. The average left ventricular global longitudinal strain is -16.3 %. The global longitudinal strain is normal. The left ventricular internal cavity size was normal in size. There is mild left ventricular hypertrophy. Left ventricular diastolic parameters are consistent with Grade I diastolic dysfunction (impaired relaxation). Right Ventricle: The right ventricular size is normal. No increase in right ventricular wall thickness. Right ventricular systolic function is normal. Left Atrium: Left atrial size was mildly dilated. Right Atrium: Right atrial size was normal in size. Pericardium: There is no evidence of pericardial effusion. Mitral Valve: The mitral valve is normal in structure. Mild mitral annular calcification. Trivial mitral valve regurgitation. No evidence of mitral valve stenosis. Tricuspid Valve: The tricuspid valve is normal in structure. Tricuspid valve regurgitation is mild . No evidence of tricuspid stenosis. Aortic Valve: The aortic valve is normal in structure. Aortic valve regurgitation is not visualized. No aortic stenosis is present. Aortic valve mean gradient measures 4.7 mmHg. Aortic valve peak gradient measures 8.4 mmHg. Aortic valve area, by VTI measures 1.88 cm. Pulmonic Valve: The pulmonic valve was normal in structure. Pulmonic valve regurgitation is mild. No evidence of pulmonic stenosis. Aorta: The aortic root is normal in size and structure. Venous: The inferior vena cava was not well visualized. IAS/Shunts: No atrial level shunt detected by color flow Doppler.  LEFT VENTRICLE PLAX 2D LVIDd:         4.30 cm  Diastology LVIDs:         2.61 cm  LV e' medial:    5.87 cm/s LV PW:         1.92 cm  LV E/e' medial:  12.5 LV IVS:        0.97 cm  LV e' lateral:   9.68 cm/s LVOT diam:     2.00 cm  LV E/e' lateral: 7.6 LV SV:         63 LV SV Index:   28       2D Longitudinal Strain LVOT Area:     3.14  cm 2D Strain GLS Avg:     -16.3 %                          3D Volume EF:                         3D EF:        60 %                         LV EDV:       274 ml  LV ESV:       110 ml                         LV SV:        165 ml RIGHT VENTRICLE RV Basal diam:  2.92 cm RV S prime:     13.30 cm/s TAPSE (M-mode): 4.5 cm LEFT ATRIUM           Index       RIGHT ATRIUM          Index LA diam:      4.30 cm 1.87 cm/m  RA Area:     9.16 cm LA Vol (A2C): 29.7 ml 12.90 ml/m RA Volume:   15.60 ml 6.77 ml/m LA Vol (A4C): 69.1 ml 30.01 ml/m  AORTIC VALVE                    PULMONIC VALVE AV Area (Vmax):    1.85 cm     PV Vmax:        0.63 m/s AV Area (Vmean):   1.88 cm     PV Peak grad:   1.6 mmHg AV Area (VTI):     1.88 cm     RVOT Peak grad: 3 mmHg AV Vmax:           145.00 cm/s AV Vmean:          100.433 cm/s AV VTI:            0.338 m AV Peak Grad:      8.4 mmHg AV Mean Grad:      4.7 mmHg LVOT Vmax:         85.50 cm/s LVOT Vmean:        60.000 cm/s LVOT VTI:          0.202 m LVOT/AV VTI ratio: 0.60  AORTA Ao Root diam: 3.57 cm MITRAL VALVE               TRICUSPID VALVE MV Area (PHT): 2.63 cm    TR Peak grad:   24.6 mmHg MV Decel Time: 288 msec    TR Vmax:        248.00 cm/s MV E velocity: 73.30 cm/s MV A velocity: 88.30 cm/s  SHUNTS MV E/A ratio:  0.83        Systemic VTI:  0.20 m                            Systemic Diam: 2.00 cm Kathlyn Sacramento MD Electronically signed by Kathlyn Sacramento MD Signature Date/Time: 03/08/2021/1:46:14 PM    Final       ASSESSMENT & PLAN:  1. HER2-positive carcinoma of breast ([place])   2. Encounter for antineoplastic chemotherapy   3. Port-A-Cath in place   4. Neuropathy   5. Chemotherapy induced nausea and vomiting   Cancer Staging Malignant neoplasm of upper-outer quadrant of right female breast [facility]) Staging form: Breast, AJCC 8th Edition - Pathologic stage from 03/03/2021: Stage IA (pT1b, pN0, cM0, G3, ER-, PR+, HER2+) - Signed by [name], MD on  03/03/2021  #Right breast invasive carcinoma pT1b pN0 ER negative, PR weakly HER2 positive positive, HER 2 positive.  Labs are reviewed and discussed with patient. Proceed with trastuzumab and Taxol today.  Decrease premed benadryl to 77m due to restless leg syndrome  # chemotherapy induced nausea and vomiting.  She gets Dexamethasone today.  Recommend  her to try Zofran 37m Q8hour PRN.  Ativan 0.538mQ8h PRN nasuea/vomiting/anxiety. Hold Xanax.   Patient has history of CHF, Repeat echocardiogram showed normal LVEF. Follow-up with cardiologist Dr. GoRockey Situ Plan to repeat echocardiogram 3 months.  #Pre-existing neuropathy in her fingertips bilaterally-Per patient this is secondary to cervical spine radiculopathy Patient is made aware that neuropathy may get worse secondary to chemotherapy  # she declined genetic testing  Supportive care measures are necessary for patient well-being and will be provided as necessary. We spent sufficient time to discuss many aspect of care, questions were answered to patient's satisfaction.   All questions were answered. The patient knows to call the clinic with any problems questions or concerns.  cc ArBurnard HawthorneFNP  Follow-up 1 week, Taxol and trastuzumab.   ZhEarlie ServerMD, PhD 03/31/2021

## 2021-03-31 NOTE — Patient Instructions (Signed)
[facility]  Discharge Instructions: Thank you for choosing [facility] to provide your oncology and hematology care.  If you have a lab appointment with the [facility], please go directly to the [facility] and check in at the registration area.  Wear comfortable clothing and clothing appropriate for easy access to any Portacath or PICC line.   We strive to give you quality time with your provider. You may need to reschedule your appointment if you arrive late (15 or more minutes).  Arriving late affects you and other patients whose appointments are after yours.  Also, if you miss three or more appointments without notifying the office, you may be dismissed from the clinic at the provider's discretion.      For prescription refill requests, have your pharmacy contact our office and allow 72 hours for refills to be completed.    Today you received the following chemotherapy and/or immunotherapy agents Kanjinti & Taxol      To help prevent nausea and vomiting after your treatment, we encourage you to take your nausea medication as directed.  BELOW ARE SYMPTOMS THAT SHOULD BE REPORTED IMMEDIATELY: *FEVER GREATER THAN 100.4 F (38 C) OR HIGHER *CHILLS OR SWEATING *NAUSEA AND VOMITING THAT IS NOT CONTROLLED WITH YOUR NAUSEA MEDICATION *UNUSUAL SHORTNESS OF BREATH *UNUSUAL BRUISING OR BLEEDING *URINARY PROBLEMS (pain or burning when urinating, or frequent urination) *BOWEL PROBLEMS (unusual diarrhea, constipation, pain near the anus) TENDERNESS IN MOUTH AND THROAT WITH OR WITHOUT PRESENCE OF ULCERS (sore throat, sores in mouth, or a toothache) UNUSUAL RASH, SWELLING OR PAIN  UNUSUAL VAGINAL DISCHARGE OR ITCHING   Items with * indicate a potential emergency and should be followed up as soon as possible or go to the Emergency Department if any problems should occur.  Please show the CHEMOTHERAPY ALERT CARD or IMMUNOTHERAPY ALERT CARD at  check-in to the Emergency Department and triage nurse.  Should you have questions after your visit or need to cancel or reschedule your appointment, please contact [facility]  [phone] and follow the prompts.  Office hours are 8:00 a.m. to 4:30 p.m. Monday - Friday. Please note that voicemails left after 4:00 p.m. may not be returned until the following business day.  We are closed weekends and major holidays. You have access to a nurse at all times for urgent questions. Please call the main number to the clinic [phone] and follow the prompts.  For any non-urgent questions, you may also contact your provider using MyChart. We now offer e-Visits for anyone 50 and older to request care online for non-urgent symptoms. For details visit [email or web address].   Also download the MyChart app! Go to the app store, search "MyChart", open the app, select [facility], and log in with your MyChart username and password.  Due to Covid, a mask is required upon entering the hospital/clinic. If you do not have a mask, one will be given to you upon arrival. For doctor visits, patients may have 1 support person aged 20 or older with them. For treatment visits, patients cannot have anyone with them due to current Covid guidelines and our immunocompromised population.

## 2021-03-31 NOTE — Progress Notes (Signed)
Pt here for follow up. Pt reports that she has been nauseated all week. She is eating very little due to nausea.

## 2021-04-01 ENCOUNTER — Encounter: Payer: Self-pay | Admitting: Family

## 2021-04-02 ENCOUNTER — Encounter: Payer: Self-pay | Admitting: Oncology

## 2021-04-02 ENCOUNTER — Other Ambulatory Visit: Payer: Self-pay | Admitting: Oncology

## 2021-04-02 ENCOUNTER — Telehealth: Payer: Self-pay | Admitting: *Deleted

## 2021-04-02 MED ORDER — PROMETHAZINE HCL 25 MG PO TABS: 25.0000 mg | ORAL_TABLET | Freq: Four times a day (QID) | ORAL | 0 refills | Status: DC | PRN

## 2021-04-02 NOTE — Telephone Encounter (Signed)
Patient called reporting that she is having trouble keeping anything down and was told to call if she had this problem. Please advise

## 2021-04-02 NOTE — Telephone Encounter (Signed)
Spoke to pt and she states that she ate a little bit and seemed that it was holding down. She took ativan, which seemed to help. She did not want to come in to [facility] today, and she will try Phenergan if symptoms perisist. Asked to go to ER if she felt worse during the weekend and to call back if she needed anything.

## 2021-04-03 ENCOUNTER — Encounter: Payer: Self-pay | Admitting: Internal Medicine

## 2021-04-03 NOTE — Progress Notes (Signed)
The answering service call regarding patient concerns for a.fib/ Heart Rate Around 100. Call back twice no answer/cannot leave voicemail.

## 2021-04-04 ENCOUNTER — Telehealth: Payer: Self-pay | Admitting: Medical

## 2021-04-04 NOTE — Progress Notes (Signed)
patient called regarding continued low blood pressures systolic [id number].   Recommend holding hydrochlorothiazide/Lasix this morning and also call Dr. [name], cardiologists for further recommendations.

## 2021-04-05 ENCOUNTER — Telehealth: Payer: Self-pay | Admitting: *Deleted

## 2021-04-05 ENCOUNTER — Other Ambulatory Visit: Payer: Self-pay

## 2021-04-05 ENCOUNTER — Emergency Department: Payer: Medicare Other

## 2021-04-05 ENCOUNTER — Inpatient Hospital Stay
Admission: EM | Admit: 2021-04-05 | Discharge: 2021-04-09 | DRG: 809 | Disposition: A | Discharge status: Home Health | Payer: Medicare Other | Attending: Hospitalist | Admitting: Hospitalist

## 2021-04-05 DIAGNOSIS — G4700 Insomnia, unspecified: Secondary | ICD-10-CM | POA: Diagnosis not present

## 2021-04-05 DIAGNOSIS — Z806 Family history of leukemia: Secondary | ICD-10-CM

## 2021-04-05 DIAGNOSIS — J449 Chronic obstructive pulmonary disease, unspecified: Secondary | ICD-10-CM | POA: Diagnosis not present

## 2021-04-05 DIAGNOSIS — Z981 Arthrodesis status: Secondary | ICD-10-CM

## 2021-04-05 DIAGNOSIS — I5030 Unspecified diastolic (congestive) heart failure: Secondary | ICD-10-CM | POA: Diagnosis not present

## 2021-04-05 DIAGNOSIS — I5032 Chronic diastolic (congestive) heart failure: Secondary | ICD-10-CM | POA: Diagnosis present

## 2021-04-05 DIAGNOSIS — G2581 Restless legs syndrome: Secondary | ICD-10-CM | POA: Diagnosis not present

## 2021-04-05 DIAGNOSIS — Z8249 Family history of ischemic heart disease and other diseases of the circulatory system: Secondary | ICD-10-CM

## 2021-04-05 DIAGNOSIS — R6510 Systemic inflammatory response syndrome (SIRS) of non-infectious origin without acute organ dysfunction: Secondary | ICD-10-CM | POA: Diagnosis not present

## 2021-04-05 DIAGNOSIS — I4819 Other persistent atrial fibrillation: Secondary | ICD-10-CM

## 2021-04-05 DIAGNOSIS — Z885 Allergy status to narcotic agent status: Secondary | ICD-10-CM

## 2021-04-05 DIAGNOSIS — Z7901 Long term (current) use of anticoagulants: Secondary | ICD-10-CM

## 2021-04-05 DIAGNOSIS — T451X5A Adverse effect of antineoplastic and immunosuppressive drugs, initial encounter: Secondary | ICD-10-CM | POA: Diagnosis not present

## 2021-04-05 DIAGNOSIS — D701 Agranulocytosis secondary to cancer chemotherapy: Secondary | ICD-10-CM | POA: Diagnosis not present

## 2021-04-05 DIAGNOSIS — I110 Hypertensive heart disease with heart failure: Secondary | ICD-10-CM | POA: Diagnosis present

## 2021-04-05 DIAGNOSIS — Z888 Allergy status to other drugs, medicaments and biological substances status: Secondary | ICD-10-CM

## 2021-04-05 DIAGNOSIS — R42 Dizziness and giddiness: Secondary | ICD-10-CM | POA: Diagnosis not present

## 2021-04-05 DIAGNOSIS — R630 Anorexia: Secondary | ICD-10-CM | POA: Diagnosis present

## 2021-04-05 DIAGNOSIS — A419 Sepsis, unspecified organism: Secondary | ICD-10-CM | POA: Diagnosis not present

## 2021-04-05 DIAGNOSIS — F419 Anxiety disorder, unspecified: Secondary | ICD-10-CM | POA: Diagnosis present

## 2021-04-05 DIAGNOSIS — Z20822 Contact with and (suspected) exposure to covid-19: Secondary | ICD-10-CM | POA: Diagnosis not present

## 2021-04-05 DIAGNOSIS — I4891 Unspecified atrial fibrillation: Secondary | ICD-10-CM

## 2021-04-05 DIAGNOSIS — Z96641 Presence of right artificial hip joint: Secondary | ICD-10-CM | POA: Diagnosis not present

## 2021-04-05 DIAGNOSIS — R110 Nausea: Secondary | ICD-10-CM

## 2021-04-05 DIAGNOSIS — C50411 Malignant neoplasm of upper-outer quadrant of right female breast: Secondary | ICD-10-CM | POA: Diagnosis not present

## 2021-04-05 DIAGNOSIS — D709 Neutropenia, unspecified: Principal | ICD-10-CM | POA: Diagnosis present

## 2021-04-05 DIAGNOSIS — Z803 Family history of malignant neoplasm of breast: Secondary | ICD-10-CM

## 2021-04-05 DIAGNOSIS — Z818 Family history of other mental and behavioral disorders: Secondary | ICD-10-CM

## 2021-04-05 DIAGNOSIS — Z170 Estrogen receptor positive status [ER+]: Secondary | ICD-10-CM | POA: Diagnosis not present

## 2021-04-05 DIAGNOSIS — C50919 Malignant neoplasm of unspecified site of unspecified female breast: Secondary | ICD-10-CM | POA: Diagnosis not present

## 2021-04-05 DIAGNOSIS — K219 Gastro-esophageal reflux disease without esophagitis: Secondary | ICD-10-CM | POA: Diagnosis present

## 2021-04-05 DIAGNOSIS — K2970 Gastritis, unspecified, without bleeding: Secondary | ICD-10-CM | POA: Diagnosis present

## 2021-04-05 DIAGNOSIS — R112 Nausea with vomiting, unspecified: Secondary | ICD-10-CM | POA: Diagnosis not present

## 2021-04-05 DIAGNOSIS — R0602 Shortness of breath: Secondary | ICD-10-CM | POA: Diagnosis not present

## 2021-04-05 DIAGNOSIS — R5081 Fever presenting with conditions classified elsewhere: Secondary | ICD-10-CM | POA: Diagnosis present

## 2021-04-05 DIAGNOSIS — Z79899 Other long term (current) drug therapy: Secondary | ICD-10-CM | POA: Diagnosis not present

## 2021-04-05 DIAGNOSIS — G4733 Obstructive sleep apnea (adult) (pediatric): Secondary | ICD-10-CM | POA: Diagnosis not present

## 2021-04-05 DIAGNOSIS — I10 Essential (primary) hypertension: Secondary | ICD-10-CM | POA: Diagnosis not present

## 2021-04-05 DIAGNOSIS — Z83438 Family history of other disorder of lipoprotein metabolism and other lipidemia: Secondary | ICD-10-CM

## 2021-04-05 DIAGNOSIS — R197 Diarrhea, unspecified: Secondary | ICD-10-CM | POA: Diagnosis not present

## 2021-04-05 DIAGNOSIS — Z6841 Body Mass Index (BMI) 40.0 and over, adult: Secondary | ICD-10-CM

## 2021-04-05 DIAGNOSIS — F32A Depression, unspecified: Secondary | ICD-10-CM | POA: Diagnosis not present

## 2021-04-05 DIAGNOSIS — Z87891 Personal history of nicotine dependence: Secondary | ICD-10-CM | POA: Diagnosis not present

## 2021-04-05 DIAGNOSIS — E785 Hyperlipidemia, unspecified: Secondary | ICD-10-CM | POA: Diagnosis not present

## 2021-04-05 DIAGNOSIS — I517 Cardiomegaly: Secondary | ICD-10-CM | POA: Diagnosis not present

## 2021-04-05 DIAGNOSIS — R040 Epistaxis: Secondary | ICD-10-CM | POA: Diagnosis present

## 2021-04-05 DIAGNOSIS — Z91040 Latex allergy status: Secondary | ICD-10-CM

## 2021-04-05 DIAGNOSIS — I480 Paroxysmal atrial fibrillation: Secondary | ICD-10-CM | POA: Diagnosis present

## 2021-04-05 DIAGNOSIS — R1110 Vomiting, unspecified: Secondary | ICD-10-CM | POA: Diagnosis not present

## 2021-04-05 LAB — CBC WITH DIFFERENTIAL/PLATELET
Abs Immature Granulocytes: 0 10*3/uL (ref 0.00–0.07)
Basophils Absolute: 0 10*3/uL (ref 0.0–0.1)
Basophils Relative: 3 %
Eosinophils Absolute: 0 10*3/uL (ref 0.0–0.5)
Eosinophils Relative: 2 %
HCT: 37.8 % (ref 36.0–46.0)
Hemoglobin: 13.5 g/dL (ref 12.0–15.0)
Immature Granulocytes: 0 %
Lymphocytes Relative: 84 %
Lymphs Abs: 0.9 10*3/uL (ref 0.7–4.0)
MCH: 31.6 pg (ref 26.0–34.0)
MCHC: 35.7 g/dL (ref 30.0–36.0)
MCV: 88.5 fL (ref 80.0–100.0)
Monocytes Absolute: 0 10*3/uL — ABNORMAL LOW (ref 0.1–1.0)
Monocytes Relative: 2 %
Neutro Abs: 0.1 10*3/uL — CL (ref 1.7–7.7)
Neutrophils Relative %: 9 %
Platelets: 160 10*3/uL (ref 150–400)
RBC: 4.27 MIL/uL (ref 3.87–5.11)
RDW: 12.1 % (ref 11.5–15.5)
WBC: 1 10*3/uL — CL (ref 4.0–10.5)
nRBC: 0 % (ref 0.0–0.2)

## 2021-04-05 LAB — RESP PANEL BY RT-PCR (FLU A&B, COVID) ARPGX2
Influenza A by PCR: NEGATIVE
Influenza B by PCR: NEGATIVE
SARS Coronavirus 2 by RT PCR: NEGATIVE

## 2021-04-05 LAB — COMPREHENSIVE METABOLIC PANEL
ALT: 22 U/L (ref 0–44)
AST: 20 U/L (ref 15–41)
Albumin: 3.7 g/dL (ref 3.5–5.0)
Alkaline Phosphatase: 69 U/L (ref 38–126)
Anion gap: 8 (ref 5–15)
BUN: 17 mg/dL (ref 8–23)
CO2: 25 mmol/L (ref 22–32)
Calcium: 8.3 mg/dL — ABNORMAL LOW (ref 8.9–10.3)
Chloride: 103 mmol/L (ref 98–111)
Creatinine, Ser: 0.98 mg/dL (ref 0.44–1.00)
GFR, Estimated: >60 mL/min (ref >60)
Glucose, Bld: 117 mg/dL — ABNORMAL HIGH (ref 70–99)
Potassium: 3.8 mmol/L (ref 3.5–5.1)
Sodium: 136 mmol/L (ref 135–145)
Total Bilirubin: 1.4 mg/dL — ABNORMAL HIGH (ref 0.3–1.2)
Total Protein: 6.9 g/dL (ref 6.5–8.1)

## 2021-04-05 LAB — TROPONIN I (HIGH SENSITIVITY)
Troponin I (High Sensitivity): <2 ng/L (ref <18)
Troponin I (High Sensitivity): <2 ng/L (ref <18)

## 2021-04-05 LAB — MAGNESIUM: Magnesium: 2 mg/dL (ref 1.7–2.4)

## 2021-04-05 LAB — LACTIC ACID, PLASMA
Lactic Acid, Venous: 1.3 mmol/L (ref 0.5–1.9)
Lactic Acid, Venous: 1 mmol/L (ref 0.5–1.9)

## 2021-04-05 MED ORDER — PROMETHAZINE HCL 25 MG PO TABS
25.0000 mg | ORAL_TABLET | Freq: Four times a day (QID) | ORAL | Status: DC | PRN
  Administered 2021-04-06 – 2021-04-08 (×3): 25 mg via ORAL
  Filled 2021-04-05 (×5): qty 1

## 2021-04-05 MED ORDER — ACETAMINOPHEN 325 MG PO TABS
650.0000 mg | ORAL_TABLET | Freq: Four times a day (QID) | ORAL | Status: DC | PRN
  Administered 2021-04-07 – 2021-04-08 (×2): 650 mg via ORAL
  Filled 2021-04-05 (×2): qty 2

## 2021-04-05 MED ORDER — VANCOMYCIN HCL IN DEXTROSE 1-5 GM/200ML-% IV SOLN: 1000.0000 mg | Freq: Once | INTRAVENOUS | Status: DC

## 2021-04-05 MED ORDER — ROPINIROLE HCL 1 MG PO TABS
1.0000 mg | ORAL_TABLET | Freq: Every day | ORAL | Status: DC
  Administered 2021-04-06 – 2021-04-08 (×3): 1 mg via ORAL
  Filled 2021-04-05 (×3): qty 1

## 2021-04-05 MED ORDER — DULOXETINE HCL 30 MG PO CPEP
30.0000 mg | ORAL_CAPSULE | Freq: Two times a day (BID) | ORAL | Status: DC
  Administered 2021-04-06 – 2021-04-09 (×7): 30 mg via ORAL
  Filled 2021-04-05 (×8): qty 1

## 2021-04-05 MED ORDER — VITAMIN D 25 MCG (1000 UNIT) PO TABS
5000.0000 [IU] | ORAL_TABLET | Freq: Every day | ORAL | Status: DC
  Administered 2021-04-06 – 2021-04-07 (×2): 5000 [IU] via ORAL
  Filled 2021-04-05 (×3): qty 5

## 2021-04-05 MED ORDER — ONDANSETRON HCL 4 MG PO TABS
4.0000 mg | ORAL_TABLET | Freq: Four times a day (QID) | ORAL | Status: DC | PRN
  Administered 2021-04-07: 4 mg via ORAL
  Filled 2021-04-05: qty 1

## 2021-04-05 MED ORDER — BUSPIRONE HCL 5 MG PO TABS
5.0000 mg | ORAL_TABLET | Freq: Three times a day (TID) | ORAL | Status: DC | PRN
  Administered 2021-04-09: 5 mg via ORAL
  Filled 2021-04-05 (×2): qty 1

## 2021-04-05 MED ORDER — ONDANSETRON HCL 4 MG PO TABS: 8.0000 mg | ORAL_TABLET | Freq: Three times a day (TID) | ORAL | Status: DC | PRN

## 2021-04-05 MED ORDER — METRONIDAZOLE 500 MG/100ML IV SOLN
500.0000 mg | Freq: Two times a day (BID) | INTRAVENOUS | Status: AC
  Administered 2021-04-06 (×2): 500 mg via INTRAVENOUS
  Filled 2021-04-05 (×2): qty 100

## 2021-04-05 MED ORDER — ONDANSETRON HCL 4 MG/2ML IJ SOLN
4.0000 mg | Freq: Once | INTRAMUSCULAR | Status: AC
  Administered 2021-04-05: 4 mg via INTRAVENOUS
  Filled 2021-04-05: qty 2

## 2021-04-05 MED ORDER — AZELASTINE HCL 0.1 % NA SOLN
2.0000 | NASAL | Status: DC | PRN
  Filled 2021-04-05: qty 30

## 2021-04-05 MED ORDER — PROCHLORPERAZINE MALEATE 10 MG PO TABS
10.0000 mg | ORAL_TABLET | Freq: Four times a day (QID) | ORAL | Status: DC | PRN
  Filled 2021-04-05: qty 1

## 2021-04-05 MED ORDER — FUROSEMIDE 20 MG PO TABS
20.0000 mg | ORAL_TABLET | Freq: Two times a day (BID) | ORAL | Status: DC
  Administered 2021-04-06 – 2021-04-09 (×7): 20 mg via ORAL
  Filled 2021-04-05 (×7): qty 1

## 2021-04-05 MED ORDER — CALCIUM CARBONATE-VITAMIN D 500-200 MG-UNIT PO TABS
1.0000 | ORAL_TABLET | Freq: Every day | ORAL | Status: DC
  Administered 2021-04-06 – 2021-04-07 (×2): 1 via ORAL
  Filled 2021-04-05 (×3): qty 1

## 2021-04-05 MED ORDER — CALCIUM GLUCONATE 500 MG PO TABS: 500.0000 | ORAL_TABLET | Freq: Every day | ORAL | Status: DC

## 2021-04-05 MED ORDER — HYDROCHLOROTHIAZIDE 12.5 MG PO CAPS
12.5000 mg | ORAL_CAPSULE | Freq: Every day | ORAL | Status: DC
  Administered 2021-04-06: 12.5 mg via ORAL
  Filled 2021-04-05: qty 1

## 2021-04-05 MED ORDER — VANCOMYCIN HCL IN DEXTROSE 1-5 GM/200ML-% IV SOLN
1000.0000 mg | Freq: Once | INTRAVENOUS | Status: DC
  Administered 2021-04-05: 1000 mg via INTRAVENOUS
  Filled 2021-04-05: qty 200

## 2021-04-05 MED ORDER — ALBUTEROL SULFATE (2.5 MG/3ML) 0.083% IN NEBU: 2.5000 mg | INHALATION_SOLUTION | Freq: Three times a day (TID) | RESPIRATORY_TRACT | Status: DC | PRN

## 2021-04-05 MED ORDER — ROSUVASTATIN CALCIUM 10 MG PO TABS
20.0000 mg | ORAL_TABLET | Freq: Every day | ORAL | Status: DC
  Administered 2021-04-06 – 2021-04-09 (×4): 20 mg via ORAL
  Filled 2021-04-05 (×3): qty 2
  Filled 2021-04-05 (×2): qty 1

## 2021-04-05 MED ORDER — TRAZODONE HCL 50 MG PO TABS
25.0000 mg | ORAL_TABLET | Freq: Every evening | ORAL | Status: DC | PRN
  Administered 2021-04-06: 25 mg via ORAL
  Filled 2021-04-05: qty 1

## 2021-04-05 MED ORDER — DILTIAZEM HCL-DEXTROSE 125-5 MG/125ML-% IV SOLN (PREMIX)
5.0000 mg/h | INTRAVENOUS | Status: DC
  Administered 2021-04-06: 10 mg/h via INTRAVENOUS
  Administered 2021-04-06: 5 mg/h via INTRAVENOUS
  Administered 2021-04-07 – 2021-04-08 (×3): 8 mg/h via INTRAVENOUS
  Filled 2021-04-05 (×6): qty 125

## 2021-04-05 MED ORDER — VANCOMYCIN HCL 1500 MG/300ML IV SOLN
1500.0000 mg | Freq: Once | INTRAVENOUS | Status: AC
  Administered 2021-04-06: 1500 mg via INTRAVENOUS
  Filled 2021-04-05: qty 300

## 2021-04-05 MED ORDER — SODIUM CHLORIDE 0.9 % IV SOLN: 2.0000 g | Freq: Once | INTRAVENOUS | Status: DC

## 2021-04-05 MED ORDER — SUMATRIPTAN SUCCINATE 50 MG PO TABS
50.0000 mg | ORAL_TABLET | ORAL | Status: DC | PRN
  Filled 2021-04-05: qty 1

## 2021-04-05 MED ORDER — SODIUM CHLORIDE 0.9 % IV SOLN
2.0000 g | Freq: Once | INTRAVENOUS | Status: AC
  Administered 2021-04-05: 2 g via INTRAVENOUS
  Filled 2021-04-05: qty 2

## 2021-04-05 MED ORDER — LOSARTAN POTASSIUM 50 MG PO TABS
100.0000 mg | ORAL_TABLET | Freq: Every day | ORAL | Status: DC
  Administered 2021-04-06 – 2021-04-08 (×3): 100 mg via ORAL
  Filled 2021-04-05 (×3): qty 2

## 2021-04-05 MED ORDER — APIXABAN 5 MG PO TABS
5.0000 mg | ORAL_TABLET | Freq: Two times a day (BID) | ORAL | Status: DC
  Administered 2021-04-06 – 2021-04-09 (×8): 5 mg via ORAL
  Filled 2021-04-05 (×8): qty 1

## 2021-04-05 MED ORDER — VANCOMYCIN HCL 1500 MG/300ML IV SOLN
1500.0000 mg | INTRAVENOUS | Status: DC
Start: 2021-04-06 — End: 2021-04-07
  Administered 2021-04-06: 1500 mg via INTRAVENOUS
  Filled 2021-04-05: qty 300

## 2021-04-05 MED ORDER — SODIUM CHLORIDE 0.9 % IV SOLN
2.0000 g | Freq: Three times a day (TID) | INTRAVENOUS | Status: DC
  Administered 2021-04-06 – 2021-04-09 (×10): 2 g via INTRAVENOUS
  Filled 2021-04-05 (×13): qty 2

## 2021-04-05 MED ORDER — B COMPLEX-C PO TABS
ORAL_TABLET | Freq: Every day | ORAL | Status: DC
  Administered 2021-04-06 – 2021-04-07 (×2): 1 via ORAL
  Filled 2021-04-05 (×4): qty 1

## 2021-04-05 MED ORDER — MAGNESIUM HYDROXIDE 400 MG/5ML PO SUSP: 30.0000 mL | Freq: Every day | ORAL | Status: DC | PRN

## 2021-04-05 MED ORDER — LORAZEPAM 0.5 MG PO TABS
0.5000 mg | ORAL_TABLET | Freq: Three times a day (TID) | ORAL | Status: DC | PRN
  Administered 2021-04-06: 0.5 mg via ORAL
  Filled 2021-04-05: qty 1

## 2021-04-05 MED ORDER — FENTANYL CITRATE PF 50 MCG/ML IJ SOSY
50.0000 ug | PREFILLED_SYRINGE | Freq: Once | INTRAMUSCULAR | Status: AC
  Administered 2021-04-05: 50 ug via INTRAVENOUS
  Filled 2021-04-05: qty 1

## 2021-04-05 MED ORDER — SODIUM CHLORIDE 0.9 % IV BOLUS
1000.0000 mL | Freq: Once | INTRAVENOUS | Status: AC
  Administered 2021-04-05: 1000 mL via INTRAVENOUS

## 2021-04-05 MED ORDER — FENTANYL CITRATE PF 50 MCG/ML IJ SOSY
50.0000 ug | PREFILLED_SYRINGE | Freq: Once | INTRAMUSCULAR | Status: AC
Start: 2021-04-05 — End: 2021-04-05
  Administered 2021-04-05: 50 ug via INTRAVENOUS
  Filled 2021-04-05: qty 1

## 2021-04-05 MED ORDER — PANTOPRAZOLE SODIUM 40 MG PO TBEC
40.0000 mg | DELAYED_RELEASE_TABLET | Freq: Every day | ORAL | Status: DC
  Administered 2021-04-06 – 2021-04-09 (×4): 40 mg via ORAL
  Filled 2021-04-05 (×4): qty 1

## 2021-04-05 MED ORDER — RANOLAZINE ER 500 MG PO TB12
500.0000 mg | ORAL_TABLET | Freq: Two times a day (BID) | ORAL | Status: DC
  Administered 2021-04-06 – 2021-04-09 (×7): 500 mg via ORAL
  Filled 2021-04-05 (×9): qty 1

## 2021-04-05 MED ORDER — POTASSIUM CHLORIDE CRYS ER 20 MEQ PO TBCR
20.0000 meq | EXTENDED_RELEASE_TABLET | Freq: Two times a day (BID) | ORAL | Status: DC
  Administered 2021-04-06 – 2021-04-09 (×8): 20 meq via ORAL
  Filled 2021-04-05 (×2): qty 2
  Filled 2021-04-05: qty 1
  Filled 2021-04-05 (×2): qty 2
  Filled 2021-04-05 (×3): qty 1

## 2021-04-05 MED ORDER — LACTATED RINGERS IV BOLUS
1000.0000 mL | Freq: Once | INTRAVENOUS | Status: AC
  Administered 2021-04-05: 1000 mL via INTRAVENOUS

## 2021-04-05 MED ORDER — ONDANSETRON HCL 4 MG/2ML IJ SOLN
4.0000 mg | Freq: Four times a day (QID) | INTRAMUSCULAR | Status: DC | PRN
  Administered 2021-04-06 – 2021-04-09 (×3): 4 mg via INTRAVENOUS
  Filled 2021-04-05 (×3): qty 2

## 2021-04-05 MED ORDER — DILTIAZEM HCL 25 MG/5ML IV SOLN
10.0000 mg | Freq: Once | INTRAVENOUS | Status: AC
  Administered 2021-04-05: 10 mg via INTRAVENOUS
  Filled 2021-04-05: qty 5

## 2021-04-05 MED ORDER — ADULT MULTIVITAMIN W/MINERALS CH
1.0000 | ORAL_TABLET | Freq: Every day | ORAL | Status: DC
  Administered 2021-04-06 – 2021-04-07 (×2): 1 via ORAL
  Filled 2021-04-05 (×3): qty 1

## 2021-04-05 MED ORDER — IOHEXOL 350 MG/ML SOLN
100.0000 mL | Freq: Once | INTRAVENOUS | Status: AC | PRN
  Administered 2021-04-05: 100 mL via INTRAVENOUS

## 2021-04-05 MED ORDER — ACETAMINOPHEN 650 MG RE SUPP
650.0000 mg | Freq: Four times a day (QID) | RECTAL | Status: DC | PRN
  Filled 2021-04-05: qty 1

## 2021-04-05 MED ORDER — METRONIDAZOLE 500 MG/100ML IV SOLN
500.0000 mg | Freq: Once | INTRAVENOUS | Status: AC
  Administered 2021-04-05: 500 mg via INTRAVENOUS
  Filled 2021-04-05: qty 100

## 2021-04-05 MED ORDER — ALPRAZOLAM 0.5 MG PO TABS: 0.2500 mg | ORAL_TABLET | Freq: Every day | ORAL | Status: DC | PRN

## 2021-04-05 MED ORDER — VANCOMYCIN HCL 1500 MG/300ML IV SOLN
1500.0000 mg | Freq: Once | INTRAVENOUS | Status: DC
  Filled 2021-04-05: qty 300

## 2021-04-05 NOTE — Consult Note (Signed)
PHARMACY -  BRIEF ANTIBIOTIC NOTE   Pharmacy has received consult(s) for Vancomcycin/Cefepime from an ED provider.  The patient's profile has been reviewed for ht/wt/allergies/indication/available labs.    One time order(s) placed for Cefepime 2g, Vancomcyin 1g  Will follow with Vancomcyin 1500mg  to complete 2500mg  loading dose  Further antibiotics/pharmacy consults should be ordered by admitting physician if indicated.                       Thank you,  Lu Duffel, PharmD, BCPS Clinical Pharmacist 04/05/2021 6:41 PM

## 2021-04-05 NOTE — Telephone Encounter (Signed)
Daughter called reporting that patient has been trying to reach the navigators all day and has not been able to get in touch with them. Patient is not abe to eat, is "sick" and may need medical attn. Of note, I do not see this person on the release of information. I have called the patient number to speak with her and got her voice mail which is full. I called to speak patient husband who said that Jonelle Sidle is his daughter and has come to take patient to ER because she is weak unable to eat and just doe snot feel well at all. I informed him that she can always call the main office number and hit option 3 to get a nurse if ever unable to reach a navigator. He repeated this back to me

## 2021-04-05 NOTE — ED Notes (Signed)
Patient denies pain and is resting comfortably.  

## 2021-04-05 NOTE — ED Provider Notes (Signed)
[facility] Emergency Department Provider Note  ____________________________________________   Event Date/Time   First MD Initiated Contact with Patient 04/05/21 1714     (approximate)  I have reviewed the triage vital signs and the nursing notes.   HISTORY  Chief Complaint Nausea, Chest Pain, and Epistaxis    HPI Amber Barnes is a 70 y.o. female with A. fib on Eliquis, hypertension, hyperlipidemia, followed by oncology for HER2 positive breast cancer, poor, on chemotherapy who comes in with concerns for is now coming in for nausea and inability to eat.  Patient reports that she last had chemotherapy on Wednesday.  She states that she started feeling bad on Saturday, 2 days ago, constant, nothing makes it better or worse.  She reports significant nausea, loss of taste and smell, abdominal pain that is constant, nothing makes it better or worse.  She also reports some chest pressure as well as a little bit of shortness of breath that she states that she symptoms gets when she is in A. fib.  Reports she reports compliance with her Eliquis.  Denies any unilateral leg swelling.  Her biggest concern is not been able to tolerate p.o. patient has a left chest wall port also reports some intermittent nosebleeds but denies any at this time          Past Medical History:  Diagnosis Date   Achilles tendinitis    Acute medial meniscal tear    Allergy    Anxiety    Atrial fibrillation ([place])    a. new onset 07/2016; b. CHADS2VASc --> 4 (HTN, female, age, CHF); c. eliquis   CHF (congestive heart failure) (HCC)    Chronic anticoagulation    Apixaban   Chronic lower back pain    Colon polyp    Complication of anesthesia    Emergence delirium; hypoxia   COPD (chronic obstructive pulmonary disease) ([place])    Cystic breast    Depression    Endometriosis    Fibrocystic breast disease    GERD (gastroesophageal reflux disease)    History of stress test    a.  07/2016 MV: EF 55-65%. Small, mild defect  in mid anteroseptal, apical septal, and apical locations. No ischemia-->low risk.   Hyperlipidemia    Hypertension    Insomnia    Lipoma    Malignant neoplasm of upper-outer quadrant of right female breast (HCC)    Stage 1A invasive mammary carcinoma (cT1b or T1c N0); ER (-), PR (+), HER2/neu (+)   Migraine    Mild Mitral regurgitation    a. 07/2016 Echo: EF 55-65%, no rwma, mild MR. Mildly dil LA. Nl RV fx.    OSA (obstructive sleep apnea)    non-compliant with nocturnal PAP therapy   Osteoarthritis    left knee   Osteopenia    Peptic ulcer disease    a. H. pylori   Pneumonia    RLS (restless legs syndrome)    Tendonitis    Thyroid disease    Vitamin D deficiency     Patient Active Problem List   Diagnosis Date Noted   Encounter for antineoplastic chemotherapy 03/24/2021   Neuropathy 03/24/2021   Port-A-Cath in place 03/24/2021   HER2-positive carcinoma of breast ([place]) 01/26/2021   Goals of care, counseling/discussion 01/26/2021   Malignant neoplasm of upper-outer quadrant of right female breast ([place]) 01/19/2021   Left arm pain 12/02/2020   Soft tissue mass 12/02/2020   Candidal intertrigo 09/09/2020   Urinary frequency 09/09/2020  Night sweat 03/31/2020   Nasal drainage 12/18/2019   Sinus congestion 12/18/2019   Nausea 10/30/2019   Major depression in remission ([place]) 08/27/2018   Primary osteoarthritis involving multiple joints 08/27/2018   Atrial fibrillation ([place]) 08/27/2018   COPD (chronic obstructive pulmonary disease) ([place]) 08/27/2018   BMI 50.0-59.9, adult ([place]) 07/16/2018   Morbid obesity with BMI of 50.0-59.9, adult ([place]) 05/31/2018   Bradycardia 05/22/2017   Restless leg syndrome 04/13/2017   OSA (obstructive sleep apnea) 01/09/2017   Fatty liver disease, nonalcoholic 11/91/4782   Family history of ovarian cancer 06/08/2016   GERD (gastroesophageal reflux disease) 05/05/2016   Hypertension 11/11/2014   Anxiety  and depression 11/11/2014   Weight loss 11/11/2014    Past Surgical History:  Procedure Laterality Date   ABDOMINAL HYSTERECTOMY     ovaries left   ACHILLES TENDON SURGERY     2012,01/2017   BACK SURGERY     fusion lumbar   BREAST BIOPSY Right 01/15/2021   Affirm biopsy-"X" clip-INVASIVE MAMMARY CARCINOMA   BREAST BIOPSY Right 01/15/2021   u/s bx-"venus" clip INVASIVE MAMMARY CARCINOMA   BREAST BIOPSY     BREAST LUMPECTOMY,RADIO FREQ LOCALIZER,AXILLARY SENTINEL LYMPH NODE BIOPSY Right 02/17/2021   Procedure: BREAST LUMPECTOMY,RADIO FREQ LOCALIZER,AXILLARY SENTINEL LYMPH NODE BIOPSY;  Surgeon: Ronny Bacon, MD;  Location: [facility];  Service: General;  Laterality: Right;   CARDIOVERSION N/A 09/07/2016   Procedure: CARDIOVERSION;  Surgeon: Minna Merritts, MD;  Location: [facility];  Service: Cardiovascular;  Laterality: N/A;   CARDIOVERSION N/A 10/19/2016   Procedure: CARDIOVERSION;  Surgeon: Minna Merritts, MD;  Location: [facility];  Service: Cardiovascular;  Laterality: N/A;   CARDIOVERSION N/A 12/14/2016   Procedure: CARDIOVERSION;  Surgeon: Minna Merritts, MD;  Location: [facility];  Service: Cardiovascular;  Laterality: N/A;   COLONOSCOPY     COLONOSCOPY WITH PROPOFOL N/A 05/13/2020   Procedure: COLONOSCOPY WITH PROPOFOL;  Surgeon: [place], Benay Pike, MD;  Location: [facility];  Service: Gastroenterology;  Laterality: N/A;  ELIQUIS   ESOPHAGOGASTRODUODENOSCOPY     ESOPHAGOGASTRODUODENOSCOPY (EGD) WITH PROPOFOL N/A 05/13/2020   Procedure: ESOPHAGOGASTRODUODENOSCOPY (EGD) WITH PROPOFOL;  Surgeon: [place], Benay Pike, MD;  Location: [facility];  Service: Gastroenterology;  Laterality: N/A;   FOOT SURGERY Left    tendon   JOINT REPLACEMENT Right    hip   PORTA CATH INSERTION N/A 03/23/2021   Procedure: PORTA CATH INSERTION;  Surgeon: Katha Cabal, MD;  Location: [facility];  Service: Cardiovascular;  Laterality: N/A;   PORTACATH PLACEMENT N/A 03/23/2021    Procedure: ATTEMPTED INSERTION PORT-A-CATH;  Surgeon: Ronny Bacon, MD;  Location: [facility];  Service: General;  Laterality: N/A;   THYROIDECTOMY     thyroid lobectomy secondary to a benign nodule   TOTAL HIP ARTHROPLASTY Right 08/21/2018   Procedure: TOTAL HIP ARTHROPLASTY ANTERIOR APPROACH-RIGHT CELL SAVER REQUESTED;  Surgeon: [name], MD;  Location: [facility];  Service: Orthopedics;  Laterality: Right;    Prior to Admission medications   Medication Sig Start Date End Date Taking? Authorizing Provider  acetaminophen (TYLENOL) 500 MG tablet Take 500 mg by mouth every 6 (six) hours as needed. Taking as needed    [provider]  albuterol (VENTOLIN HFA) 108 (90 Base) MCG/ACT inhaler INHALE 2 PUFFS INTO THE LUNGS EVERY 8 (EIGHT) HOURS AS NEEDED FOR WHEEZING OR SHORTNESS OF BREATH. 04/04/19   [name], FNP  ALPRAZolam (XANAX) 0.25 MG tablet TAKE 1 TABLET BY MOUTH DAILY AS NEEDED FOR ANXIETY OR SLEEP. 01/29/21  [name], FNP  azelastine (ASTELIN) 0.1 % nasal spray Place into both nostrils as needed for rhinitis. Use in each nostril as directed    [provider]  B Complex Vitamins (VITAMIN B COMPLEX PO) Take 1 tablet by mouth daily.     [provider]  bisoprolol (ZEBETA) 5 MG tablet TAKE 1 TABLET BY MOUTH IN THE MORNING AND MAY TAKE AN EXTRA IN THE EVENING IF NEEDED FOR HEART RATE 03/10/21   Minna Merritts, MD  busPIRone (BUSPAR) 5 MG tablet TAKE 1 TABLET BY MOUTH THREE TIMES A DAY AS NEEDED 03/02/21   [name], FNP  calcium gluconate 500 MG tablet Take 500 tablets by mouth daily.    [provider]  calcium-vitamin D (OSCAL WITH D) 500-200 MG-UNIT tablet Take 1 tablet by mouth daily with breakfast.    [provider]  Cholecalciferol (VITAMIN D3) 5000 units CAPS Take 5,000 Units by mouth daily.    [provider]  DULoxetine (CYMBALTA) 30 MG capsule Take two tablets by mouth for a total of 60 MG  daily. Patient taking differently: Take 30 mg by mouth 2 (two) times daily. 01/28/21   [name], FNP  ELIQUIS 5 MG TABS tablet TAKE 1 TABLET BY MOUTH TWICE A DAY 11/25/20   Minna Merritts, MD  esomeprazole (NEXIUM) 20 MG capsule Take 20 mg by mouth as needed.    [provider]  furosemide (LASIX) 20 MG tablet Take 1 tablet (20 mg total) by mouth 2 (two) times daily. 03/25/21   Minna Merritts, MD  hydrochlorothiazide (MICROZIDE) 12.5 MG capsule TAKE 1 CAPSULE BY MOUTH EVERY DAY 03/25/21   Minna Merritts, MD  lidocaine-prilocaine (EMLA) cream Apply to affected area once 03/19/21   [name], MD  LORazepam (ATIVAN) 0.5 MG tablet Take 1 tablet (0.5 mg total) by mouth every 8 (eight) hours as needed for anxiety (nausea/ vomiting). 03/31/21   [name], MD  losartan (COZAAR) 100 MG tablet TAKE 1 TABLET BY MOUTH DAILY. PLEASE CALL OFFICE TO SCHEDULE AN APPOINTMENT FOR FURTHER REFILLS 02/15/21   Minna Merritts, MD  Multiple Vitamin (MULTIVITAMIN) tablet Take 1 tablet by mouth daily.    [provider]  ondansetron (ZOFRAN) 8 MG tablet Take 1 tablet (8 mg total) by mouth every 8 (eight) hours as needed for nausea or vomiting. 03/31/21   [name], MD  potassium chloride SA (KLOR-CON) 20 MEQ tablet Take 1 tablet (20 mEq total) by mouth 2 (two) times daily. 03/03/21   [name], FNP  prochlorperazine (COMPAZINE) 10 MG tablet Take 1 tablet (10 mg total) by mouth every 6 (six) hours as needed (Nausea or vomiting). 03/19/21   [name], MD  promethazine (PHENERGAN) 25 MG tablet Take 1 tablet (25 mg total) by mouth every 6 (six) hours as needed for nausea or vomiting. 04/02/21   [name], MD  ranolazine (RANEXA) 500 MG 12 hr tablet TAKE 1 TABLET BY MOUTH TWICE A DAY 03/25/21   Gollan, [name], MD  rizatriptan (MAXALT) 10 MG tablet TAKE 1 TABLET (10 MG TOTAL) BY MOUTH AS NEEDED FOR MIGRAINE. MAY REPEAT IN 2 HOURS IF NEEDED 03/13/18   Melvenia Beam, MD  rOPINIRole (REQUIP) 1 MG  tablet TAKE 1 TABLET BY MOUTH AT BEDTIME. 01/28/21   [name], FNP  rosuvastatin (CRESTOR) 20 MG tablet TAKE 1 TABLET BY MOUTH EVERY DAY 03/25/21   [name], FNP    Allergies  Prednisone, Amiodarone, Hydrocodone-acetaminophen, Oxycodone, Tapentadol, Tramadol, Adhesive [tape], and Latex  Family History  Problem Relation Age of Onset   Cancer Sister 74       ovarian   Depression Sister    Cancer Brother        CLL   Cancer Father 91       colon   Heart disease Father    Heart attack Father 68       died from MI   Hyperlipidemia Father    Hypertension Father    Cancer Mother        leukemia   Breast cancer Paternal Aunt        38's   Migraines Neg Hx     Social History Social History   Tobacco Use   Smoking status: Former    Packs/day: 0.25    Years: 10.00    Pack years: 2.50    Types: Cigarettes    Quit date: 2005    Years since quitting: 17.7   Smokeless tobacco: Never   Tobacco comments:    smoked for 10 years, 1 pack every 2-3 days  Vaping Use   Vaping Use: Never used  Substance Use Topics   Alcohol use: Not Currently    Alcohol/week: 1.0 standard drink    Types: 1 Glasses of wine per week    Comment: rarely   Drug use: No      Review of Systems Constitutional: No fever/chills Eyes: No visual changes. ENT: No sore throat. Cardiovascular: Positive chest pain Respiratory: Positive shortness of breath Gastrointestinal: Nausea, vomiting, abdominal pain Genitourinary: Negative for dysuria. Musculoskeletal: Negative for back pain. Skin: Negative for rash. Neurological: Negative for headaches, focal weakness or numbness. All other ROS negative ____________________________________________   PHYSICAL EXAM:  VITAL SIGNS: ED Triage Vitals  Enc Vitals Group     BP 04/05/21 1703 (!) 121/95     Pulse Rate 04/05/21 1703 (!) 137     Resp 04/05/21 1703 18     Temp 04/05/21 1703 98.7 F (37.1 C)     Temp src --      SpO2 04/05/21 1703 95  %     Weight --      Height --      Head Circumference --      Peak Flow --      Pain Score 04/05/21 1707 8     Pain Loc --      Pain Edu? --      Excl. in [place]? --     Constitutional: Alert and oriented. Well appearing and in no acute distress. Eyes: Conjunctivae are normal. EOMI. Head: Atraumatic. Nose: No congestion/rhinnorhea. Mouth/Throat: Mucous membranes are moist.   Neck: No stridor. Trachea Midline. FROM Cardiovascular: Irregular, tachycardic. Grossly normal heart sounds.  Good peripheral circulation.  Left chest wall port without any erythema or redness Respiratory: Normal respiratory effort.  No retractions. Lungs CTAB. Gastrointestinal: Tender throughout her abdomen.. No distention. No abdominal bruits.  Musculoskeletal: No lower extremity tenderness nor edema.  No joint effusions. Neurologic:  Normal speech and language. No gross focal neurologic deficits are appreciated.  Skin:  Skin is warm, dry and intact. No rash noted. Psychiatric: Mood and affect are normal. Speech and behavior are normal. GU: Deferred   ____________________________________________   LABS (all labs ordered are listed, but only abnormal results are displayed)  Labs Reviewed  RESP PANEL BY RT-PCR (FLU A&B, COVID) ARPGX2  CULTURE, BLOOD (ROUTINE X 2)  CULTURE, BLOOD (ROUTINE X  2)  CBC WITH DIFFERENTIAL/PLATELET  COMPREHENSIVE METABOLIC PANEL  MAGNESIUM  URINALYSIS, ROUTINE W REFLEX MICROSCOPIC  LACTIC ACID, PLASMA  LACTIC ACID, PLASMA  TROPONIN I (HIGH SENSITIVITY)   ____________________________________________   ED ECG REPORT I, Vanessa [place], the attending physician, personally viewed and interpreted this ECG.  A. fib rate of 131, no ST elevation, no T wave versions, normal intervals ____________________________________________  RADIOLOGY Robert Bellow, personally viewed and evaluated these images (plain radiographs) as part of my medical decision making, as well as reviewing the  written report by the radiologist.  ED MD interpretation: No pneumonia on chest x-ray  Official radiology report(s): CT HEAD WO CONTRAST (5MM)  Result Date: 04/05/2021 CLINICAL DATA:  Dizziness nausea vomiting EXAM: CT HEAD WITHOUT CONTRAST TECHNIQUE: Contiguous axial images were obtained from the base of the skull through the vertex without intravenous contrast. COMPARISON:  CT brain 04/17/2018 FINDINGS: Brain: No evidence of acute infarction, hemorrhage, hydrocephalus, extra-axial collection or mass lesion/mass effect. Vascular: No hyperdense vessels. Mild carotid vascular calcification Skull: Normal. Negative for fracture or focal lesion. Sinuses/Orbits: No acute finding. Other: None IMPRESSION: Negative non contrasted CT appearance of the brain Electronically Signed   By: Donavan Foil M.D.   On: 04/05/2021 18:33   CT ABDOMEN PELVIS W CONTRAST  Result Date: 04/05/2021 CLINICAL DATA:  C/O nausea, vomiting, nose bleeds since Saturday. Last chemo Wednesday. EXAM: CT ABDOMEN AND PELVIS WITH CONTRAST TECHNIQUE: Multidetector CT imaging of the abdomen and pelvis was performed using the standard protocol following bolus administration of intravenous contrast. CONTRAST:  176m OMNIPAQUE IOHEXOL 350 MG/ML SOLN COMPARISON:  CT abdomen pelvis 11/05/2019 FINDINGS: Lower chest: No acute abnormality. Hepatobiliary: No focal liver abnormality. No gallstones, gallbladder wall thickening, or pericholecystic fluid. No biliary dilatation. Pancreas: No focal lesion. Normal pancreatic contour. No surrounding inflammatory changes. No main pancreatic ductal dilatation. Spleen: Normal in size without focal abnormality. Adrenals/Urinary Tract: No adrenal nodule bilaterally. Bilateral kidneys enhance symmetrically. There is a 2 cm fluid density lesion within the left kidney that likely represents a simple renal cyst. Subcentimeter hypodensities are too small to characterize. Punctate calcification within the right kidney. No  hydronephrosis. No hydroureter. The urinary bladder is unremarkable. On delayed imaging, there is no urothelial wall thickening and there are no filling defects in the opacified portions of the bilateral collecting systems or ureters. Stomach/Bowel: Stomach is within normal limits. No evidence of bowel wall thickening or dilatation. Appendix appears normal. Vascular/Lymphatic: No abdominal aorta or iliac aneurysm. Severe atherosclerotic plaque of the aorta and its branches. No abdominal, pelvic, or inguinal lymphadenopathy. Reproductive: Not visualized due to streak artifact originating from the right femoral surgical hardware. Other: No intraperitoneal free fluid. No intraperitoneal free gas. No organized fluid collection. Musculoskeletal: No abdominal wall hernia or abnormality. No suspicious lytic or blastic osseous lesions. No acute displaced fracture. Multilevel degenerative changes of the spine. Total right hip arthroplasty. IMPRESSION: 1. No acute intra-abdominal or intrapelvic abnormality. 2.  Aortic Atherosclerosis (ICD10-I70.0). Electronically Signed   By: MIven FinnM.D.   On: 04/05/2021 20:16   DG Chest Portable 1 View  Result Date: 04/05/2021 CLINICAL DATA:  Shortness of breath EXAM: PORTABLE CHEST 1 VIEW COMPARISON:  03/23/2021 FINDINGS: Left-sided central venous port with tip over the SVC. Cardiomegaly with vascular congestion. No consolidation, pleural effusion, or pneumothorax IMPRESSION: Cardiomegaly with vascular congestion Electronically Signed   By: KDonavan FoilM.D.   On: 04/05/2021 18:31    ____________________________________________   PROCEDURES  Procedure(s) performed (including  Critical Care):  .1-3 Lead EKG Interpretation Performed by: Vanessa Wells, MD Authorized by: Vanessa [place], MD     Interpretation: abnormal     ECG rate:  130s   ECG rate assessment: tachycardic     Rhythm: atrial fibrillation     Ectopy: none     Conduction: normal   .Critical  Care Performed by: Vanessa Bearden, MD Authorized by: Vanessa Fillmore, MD   Critical care provider statement:    Critical care time (minutes):  35   Critical care was necessary to treat or prevent imminent or life-threatening deterioration of the following conditions:  Sepsis   Critical care was time spent personally by me on the following activities:  Discussions with consultants, evaluation of patient's response to treatment, examination of patient, ordering and performing treatments and interventions, ordering and review of laboratory studies, ordering and review of radiographic studies, pulse oximetry, re-evaluation of patient's condition, obtaining history from patient or surrogate and review of old charts   ____________________________________________   INITIAL IMPRESSION / ASSESSMENT AND PLAN / ED COURSE  Saia Derossett was evaluated in Emergency Department on 04/05/2021 for the symptoms described in the history of present illness. She was evaluated in the context of the global COVID-19 pandemic, which necessitated consideration that the patient might be at risk for infection with the SARS-CoV-2 virus that causes COVID-19. Institutional protocols and algorithms that pertain to the evaluation of patients at risk for COVID-19 are in a state of rapid change based on information released by regulatory bodies including the CDC and federal and state organizations. These policies and algorithms were followed during the patient's care in the ED.     Patient comes in with low-grade temperatures and tachycardia.  Patient sounds fluid down so we will stop a liter of fluid to give some Bentyl and Zofran to help with pain and nausea.  Will get labs to evaluate for Electra abnormalities, AKI, UTI, cardiac markers to evaluate for ACS, CT imaging to evaluate for any intracranial mass that could be causing her nausea and vomiting as well as CT of her abdomen to evaluate for any other acute obstruction.  This  could all just be from her chemotherapy.  We will continue to closely monitor  Patient's labs showed neutropenia.  Given her elevated heart rates and her low-grade temperature we will start broad-spectrum antibiotics.  No infection around the port.  We will discuss with the hospital team for admission given continued nausea and vomiting not tolerating p.o.  Given patient's A. fib her heart rates are still elevated after the fluid so given dose of dilt and started on a dill drip.  Will discuss with hospital team for admission        ____________________________________________   FINAL CLINICAL IMPRESSION(S) / ED DIAGNOSES   Final diagnoses:  Sepsis, due to unspecified organism, unspecified whether acute organ dysfunction present ([place])  Neutropenia, unspecified type ([place])  Atrial fibrillation with rapid ventricular response ([place])      MEDICATIONS GIVEN DURING THIS VISIT:  Medications  metroNIDAZOLE (FLAGYL) IVPB 500 mg (500 mg Intravenous New Bag/Given 04/05/21 2025)  vancomycin (VANCOCIN) IVPB 1000 mg/200 mL premix (has no administration in time range)  vancomycin (VANCOREADY) IVPB 1500 mg/300 mL (has no administration in time range)  fentaNYL (SUBLIMAZE) injection 50 mcg (has no administration in time range)  diltiazem (CARDIZEM) 125 mg in dextrose 5% 125 mL (1 mg/mL) infusion (has no administration in time range)  diltiazem (CARDIZEM) injection 10 mg (has  no administration in time range)  sodium chloride 0.9 % bolus 1,000 mL (0 mLs Intravenous Stopped 04/05/21 2012)  fentaNYL (SUBLIMAZE) injection 50 mcg (50 mcg Intravenous Given 04/05/21 1805)  ondansetron (ZOFRAN) injection 4 mg (4 mg Intravenous Given 04/05/21 1805)  ceFEPIme (MAXIPIME) 2 g in sodium chloride 0.9 % 100 mL IVPB (0 g Intravenous Stopped 04/05/21 2013)  lactated ringers bolus 1,000 mL (1,000 mLs Intravenous New Bag/Given 04/05/21 2030)  iohexol (OMNIPAQUE) 350 MG/ML injection 100 mL (100 mLs Intravenous Contrast Given  04/05/21 1933)     ED Discharge Orders     None        Note:  This document was prepared using Dragon voice recognition software and may include unintentional dictation errors.    Vanessa [place], MD 04/05/21 2102

## 2021-04-05 NOTE — H&P (Addendum)
[place]   PATIENT NAME: Amber Barnes    MR#:  [id number]  DATE OF BIRTH:  [birth year]  DATE OF ADMISSION:  04/05/2021  PRIMARY CARE PHYSICIAN: [name], FNP   Patient is coming from: Home  REQUESTING/REFERRING PHYSICIAN: Marjean Donna, MD  CHIEF COMPLAINT:   Chief Complaint  Patient presents with   Nausea   Chest Pain   Epistaxis    HISTORY OF PRESENT ILLNESS:  Amber Barnes is a 70 y.o. female with medical history significant for multiple medical problems that are mentioned below, including breast cancer diagnosed a month ago currently undergoing active chemotherapy, who presented to the emergency room with acute onset of nausea and vomiting for the last couple of days with associated diarrhea this morning and abdominal pain today with low-grade fever.  She admits to dyspnea and wheezing without cough or hemoptysis.  She has been having palpitations.  She denied any chest pain or abdominal pain.  No dysuria, oliguria or hematuria or flank pain.  Her last chemotherapy session was on Wednesday.  ED Course: Upon presentation to the ER, blood pressure was 121/95 with heart rate of 137 otherwise normal vital signs.  Temperature later on was 99.7.  Labs revealed unremarkable CMP except for total bili 1.4.  High-sensitivity troponin I was less than 2 twice.  Lactic acid was 1.3 and later 1.  CBC showed leukopenia of 1 with ANC of 0.1.  Influenza antigens and COVID-19 PCR came back negative.  She had 2 blood cultures drawn. EKG as reviewed by me : EKG showed atrial fibrillation with rapid ventricular response of 131 with low voltage QRS Imaging: Chest x-ray showed cardiomegaly with vascular congestion. Abdominal pelvic CT scan showed no acute abnormalities.  It showed aortic atherosclerosis. Noncontrasted CT scan revealed no acute intracranial normalities.  The patient was given empiric antibiotic therapy with IV cefepime, vancomycin and Flagyl as well as 4 mg of IV  Zofran and 50 mcg of IV fentanyl twice and 1 L bolus of IV normal saline.  When she remained tachycardic she was given 10 mg of IV Cardizem followed by IV Cardizem drip.  She will be admitted to the PCU bed for further evaluation and management.  PAST MEDICAL HISTORY:   Past Medical History:  Diagnosis Date   Achilles tendinitis    Acute medial meniscal tear    Allergy    Anxiety    Atrial fibrillation ([place])    a. new onset 07/2016; b. CHADS2VASc --> 4 (HTN, female, age, CHF); c. eliquis   CHF (congestive heart failure) (HCC)    Chronic anticoagulation    Apixaban   Chronic lower back pain    Colon polyp    Complication of anesthesia    Emergence delirium; hypoxia   COPD (chronic obstructive pulmonary disease) ([place])    Cystic breast    Depression    Endometriosis    Fibrocystic breast disease    GERD (gastroesophageal reflux disease)    History of stress test    a. 07/2016 MV: EF 55-65%. Small, mild defect  in mid anteroseptal, apical septal, and apical locations. No ischemia-->low risk.   Hyperlipidemia    Hypertension    Insomnia    Lipoma    Malignant neoplasm of upper-outer quadrant of right female breast (HCC)    Stage 1A invasive mammary carcinoma (cT1b or T1c N0); ER (-), PR (+), HER2/neu (+)   Migraine    Mild Mitral regurgitation    a. 07/2016  Echo: EF 55-65%, no rwma, mild MR. Mildly dil LA. Nl RV fx.    OSA (obstructive sleep apnea)    non-compliant with nocturnal PAP therapy   Osteoarthritis    left knee   Osteopenia    Peptic ulcer disease    a. H. pylori   Pneumonia    RLS (restless legs syndrome)    Tendonitis    Thyroid disease    Vitamin D deficiency     PAST SURGICAL HISTORY:   Past Surgical History:  Procedure Laterality Date   ABDOMINAL HYSTERECTOMY     ovaries left   ACHILLES TENDON SURGERY     2012,01/2017   BACK SURGERY     fusion lumbar   BREAST BIOPSY Right 01/15/2021   Affirm biopsy-"X" clip-INVASIVE MAMMARY CARCINOMA   BREAST BIOPSY  Right 01/15/2021   u/s bx-"venus" clip INVASIVE MAMMARY CARCINOMA   BREAST BIOPSY     BREAST LUMPECTOMY,RADIO FREQ LOCALIZER,AXILLARY SENTINEL LYMPH NODE BIOPSY Right 02/17/2021   Procedure: BREAST LUMPECTOMY,RADIO FREQ LOCALIZER,AXILLARY SENTINEL LYMPH NODE BIOPSY;  Surgeon: Ronny Bacon, MD;  Location: [facility];  Service: General;  Laterality: Right;   CARDIOVERSION N/A 09/07/2016   Procedure: CARDIOVERSION;  Surgeon: Minna Merritts, MD;  Location: [facility];  Service: Cardiovascular;  Laterality: N/A;   CARDIOVERSION N/A 10/19/2016   Procedure: CARDIOVERSION;  Surgeon: Minna Merritts, MD;  Location: [facility];  Service: Cardiovascular;  Laterality: N/A;   CARDIOVERSION N/A 12/14/2016   Procedure: CARDIOVERSION;  Surgeon: Minna Merritts, MD;  Location: [facility];  Service: Cardiovascular;  Laterality: N/A;   COLONOSCOPY     COLONOSCOPY WITH PROPOFOL N/A 05/13/2020   Procedure: COLONOSCOPY WITH PROPOFOL;  Surgeon: Toledo, Benay Pike, MD;  Location: [facility];  Service: Gastroenterology;  Laterality: N/A;  ELIQUIS   ESOPHAGOGASTRODUODENOSCOPY     ESOPHAGOGASTRODUODENOSCOPY (EGD) WITH PROPOFOL N/A 05/13/2020   Procedure: ESOPHAGOGASTRODUODENOSCOPY (EGD) WITH PROPOFOL;  Surgeon: Toledo, Benay Pike, MD;  Location: [facility];  Service: Gastroenterology;  Laterality: N/A;   FOOT SURGERY Left    tendon   JOINT REPLACEMENT Right    hip   PORTA CATH INSERTION N/A 03/23/2021   Procedure: PORTA CATH INSERTION;  Surgeon: Katha Cabal, MD;  Location: [facility];  Service: Cardiovascular;  Laterality: N/A;   PORTACATH PLACEMENT N/A 03/23/2021   Procedure: ATTEMPTED INSERTION PORT-A-CATH;  Surgeon: Ronny Bacon, MD;  Location: [facility];  Service: General;  Laterality: N/A;   THYROIDECTOMY     thyroid lobectomy secondary to a benign nodule   TOTAL HIP ARTHROPLASTY Right 08/21/2018   Procedure: TOTAL HIP ARTHROPLASTY ANTERIOR APPROACH-RIGHT CELL SAVER REQUESTED;   Surgeon: [name], MD;  Location: [facility];  Service: Orthopedics;  Laterality: Right;    SOCIAL HISTORY:   Social History   Tobacco Use   Smoking status: Former    Packs/day: 0.25    Years: 10.00    Pack years: 2.50    Types: Cigarettes    Quit date: 2005    Years since quitting: 17.7   Smokeless tobacco: Never   Tobacco comments:    smoked for 10 years, 1 pack every 2-3 days  Substance Use Topics   Alcohol use: Not Currently    Alcohol/week: 1.0 standard drink    Types: 1 Glasses of wine per week    Comment: rarely    FAMILY HISTORY:   Family History  Problem Relation Age of Onset   Cancer Sister 32       ovarian   Depression  Sister    Cancer Brother        CLL   Cancer Father 59       colon   Heart disease Father    Heart attack Father 40       died from MI   Hyperlipidemia Father    Hypertension Father    Cancer Mother        leukemia   Breast cancer Paternal Aunt        73's   Migraines Neg Hx     DRUG ALLERGIES:   Allergies  Allergen Reactions   Prednisone Other (See Comments)    Agitation, hyperactivity, polyphagia   Amiodarone Nausea And Vomiting   Hydrocodone-Acetaminophen Other (See Comments)    Hallucinations and combative   Oxycodone Itching    Can take it with benadryl   Tapentadol Itching   Tramadol Itching    Can take it with benadryl   Adhesive [Tape] Itching and Rash    Prefers paper tape   Latex Itching and Rash    use paper tap    REVIEW OF SYSTEMS:   ROS As per history of present illness. All pertinent systems were reviewed above. Constitutional, HEENT, cardiovascular, respiratory, GI, GU, musculoskeletal, neuro, psychiatric, endocrine, integumentary and hematologic systems were reviewed and are otherwise negative/unremarkable except for positive findings mentioned above in the HPI.   MEDICATIONS AT HOME:   Prior to Admission medications   Medication Sig Start Date End Date Taking? Authorizing Provider   acetaminophen (TYLENOL) 500 MG tablet Take 500 mg by mouth every 6 (six) hours as needed. Taking as needed    [provider]  albuterol (VENTOLIN HFA) 108 (90 Base) MCG/ACT inhaler INHALE 2 PUFFS INTO THE LUNGS EVERY 8 (EIGHT) HOURS AS NEEDED FOR WHEEZING OR SHORTNESS OF BREATH. 04/04/19   [name], FNP  ALPRAZolam Duanne Moron) 0.25 MG tablet TAKE 1 TABLET BY MOUTH DAILY AS NEEDED FOR ANXIETY OR SLEEP. 01/29/21   [name], FNP  azelastine (ASTELIN) 0.1 % nasal spray Place into both nostrils as needed for rhinitis. Use in each nostril as directed    [provider]  B Complex Vitamins (VITAMIN B COMPLEX PO) Take 1 tablet by mouth daily.     [provider]  bisoprolol (ZEBETA) 5 MG tablet TAKE 1 TABLET BY MOUTH IN THE MORNING AND MAY TAKE AN EXTRA IN THE EVENING IF NEEDED FOR HEART RATE 03/10/21   Minna Merritts, MD  busPIRone (BUSPAR) 5 MG tablet TAKE 1 TABLET BY MOUTH THREE TIMES A DAY AS NEEDED 03/02/21   [name], FNP  calcium gluconate 500 MG tablet Take 500 tablets by mouth daily.    [provider]  calcium-vitamin D (OSCAL WITH D) 500-200 MG-UNIT tablet Take 1 tablet by mouth daily with breakfast.    [provider]  Cholecalciferol (VITAMIN D3) 5000 units CAPS Take 5,000 Units by mouth daily.    [provider]  DULoxetine (CYMBALTA) 30 MG capsule Take two tablets by mouth for a total of 60 MG daily. Patient taking differently: Take 30 mg by mouth 2 (two) times daily. 01/28/21   [name], FNP  ELIQUIS 5 MG TABS tablet TAKE 1 TABLET BY MOUTH TWICE A DAY 11/25/20   Minna Merritts, MD  esomeprazole (NEXIUM) 20 MG capsule Take 20 mg by mouth as needed.    [provider]  furosemide (LASIX) 20 MG tablet Take 1 tablet (20 mg total) by mouth 2 (two)  times daily. 03/25/21   Minna Merritts, MD  hydrochlorothiazide (MICROZIDE) 12.5 MG capsule TAKE 1 CAPSULE BY MOUTH EVERY DAY 03/25/21   Minna Merritts, MD  lidocaine-prilocaine (EMLA) cream Apply to affected area once 03/19/21   [name], MD  LORazepam (ATIVAN) 0.5 MG tablet Take 1 tablet (0.5 mg total) by mouth every 8 (eight) hours as needed for anxiety (nausea/ vomiting). 03/31/21   [name], MD  losartan (COZAAR) 100 MG tablet TAKE 1 TABLET BY MOUTH DAILY. PLEASE CALL OFFICE TO SCHEDULE AN APPOINTMENT FOR FURTHER REFILLS 02/15/21   Minna Merritts, MD  Multiple Vitamin (MULTIVITAMIN) tablet Take 1 tablet by mouth daily.    [provider]  ondansetron (ZOFRAN) 8 MG tablet Take 1 tablet (8 mg total) by mouth every 8 (eight) hours as needed for nausea or vomiting. 03/31/21   [name], MD  potassium chloride SA (KLOR-CON) 20 MEQ tablet Take 1 tablet (20 mEq total) by mouth 2 (two) times daily. 03/03/21   [name], FNP  prochlorperazine (COMPAZINE) 10 MG tablet Take 1 tablet (10 mg total) by mouth every 6 (six) hours as needed (Nausea or vomiting). 03/19/21   [name], MD  promethazine (PHENERGAN) 25 MG tablet Take 1 tablet (25 mg total) by mouth every 6 (six) hours as needed for nausea or vomiting. 04/02/21   [name], MD  ranolazine (RANEXA) 500 MG 12 hr tablet TAKE 1 TABLET BY MOUTH TWICE A DAY 03/25/21   Gollan, [name], MD  rizatriptan (MAXALT) 10 MG tablet TAKE 1 TABLET (10 MG TOTAL) BY MOUTH AS NEEDED FOR MIGRAINE. MAY REPEAT IN 2 HOURS IF NEEDED 03/13/18   Melvenia Beam, MD  rOPINIRole (REQUIP) 1 MG tablet TAKE 1 TABLET BY MOUTH AT BEDTIME. 01/28/21   [name], FNP  rosuvastatin (CRESTOR) 20 MG tablet TAKE 1 TABLET BY MOUTH EVERY DAY 03/25/21   [name], FNP      VITAL SIGNS:  Blood pressure 124/89, pulse (!) 45, temperature 99.7 F (37.6 C), temperature source Oral, resp. rate 16, SpO2 97 %.  PHYSICAL EXAMINATION:  Physical Exam  GENERAL:  70 y.o.-year-old Caucasian female patient lying in the bed with no acute distress.  EYES: Pupils equal, round, reactive to light and accommodation. No scleral  icterus. Extraocular muscles intact.  HEENT: Head atraumatic, normocephalic. Oropharynx and nasopharynx clear.  NECK:  Supple, no jugular venous distention. No thyroid enlargement, no tenderness.  LUNGS: Normal breath sounds bilaterally, no wheezing, rales,rhonchi or crepitation. No use of accessory muscles of respiration.  CARDIOVASCULAR: Irregularly irregular mildly tachycardic, S1, S2 normal. No murmurs, rubs, or gallops.  ABDOMEN: Soft, nondistended, with mild epigastric tenderness without rebound tenderness guarding or rigidity.  Bowel sounds present. No organomegaly or mass.  EXTREMITIES: No pedal edema, cyanosis, or clubbing.  NEUROLOGIC: Cranial nerves II through XII are intact. Muscle strength 5/5 in all extremities. Sensation intact. Gait not checked.  PSYCHIATRIC: The patient is alert and oriented x 3.  Normal affect and good eye contact. SKIN: No obvious rash, lesion, or ulcer.   LABORATORY PANEL:   CBC Recent Labs  Lab 04/05/21 1738  WBC 1.0*  HGB 13.5  HCT 37.8  PLT 160   ------------------------------------------------------------------------------------------------------------------  Chemistries  Recent Labs  Lab 04/05/21 1738  NA 136  K 3.8  CL 103  CO2 25  GLUCOSE 117*  BUN 17  CREATININE 0.98  CALCIUM 8.3*  MG 2.0  AST 20  ALT 22  ALKPHOS 69  BILITOT 1.4*   ------------------------------------------------------------------------------------------------------------------  Cardiac Enzymes No results for input(s): TROPONINI in the last 168 hours. ------------------------------------------------------------------------------------------------------------------  RADIOLOGY:  CT HEAD WO CONTRAST (5MM)  Result Date: 04/05/2021 CLINICAL DATA:  Dizziness nausea vomiting EXAM: CT HEAD WITHOUT CONTRAST TECHNIQUE: Contiguous axial images were obtained from the base of the skull through the vertex without intravenous contrast. COMPARISON:  CT brain 04/17/2018  FINDINGS: Brain: No evidence of acute infarction, hemorrhage, hydrocephalus, extra-axial collection or mass lesion/mass effect. Vascular: No hyperdense vessels. Mild carotid vascular calcification Skull: Normal. Negative for fracture or focal lesion. Sinuses/Orbits: No acute finding. Other: None IMPRESSION: Negative non contrasted CT appearance of the brain Electronically Signed   By: Donavan Foil M.D.   On: 04/05/2021 18:33   CT ABDOMEN PELVIS W CONTRAST  Result Date: 04/05/2021 CLINICAL DATA:  C/O nausea, vomiting, nose bleeds since Saturday. Last chemo Wednesday. EXAM: CT ABDOMEN AND PELVIS WITH CONTRAST TECHNIQUE: Multidetector CT imaging of the abdomen and pelvis was performed using the standard protocol following bolus administration of intravenous contrast. CONTRAST:  142m OMNIPAQUE IOHEXOL 350 MG/ML SOLN COMPARISON:  CT abdomen pelvis 11/05/2019 FINDINGS: Lower chest: No acute abnormality. Hepatobiliary: No focal liver abnormality. No gallstones, gallbladder wall thickening, or pericholecystic fluid. No biliary dilatation. Pancreas: No focal lesion. Normal pancreatic contour. No surrounding inflammatory changes. No main pancreatic ductal dilatation. Spleen: Normal in size without focal abnormality. Adrenals/Urinary Tract: No adrenal nodule bilaterally. Bilateral kidneys enhance symmetrically. There is a 2 cm fluid density lesion within the left kidney that likely represents a simple renal cyst. Subcentimeter hypodensities are too small to characterize. Punctate calcification within the right kidney. No hydronephrosis. No hydroureter. The urinary bladder is unremarkable. On delayed imaging, there is no urothelial wall thickening and there are no filling defects in the opacified portions of the bilateral collecting systems or ureters. Stomach/Bowel: Stomach is within normal limits. No evidence of bowel wall thickening or dilatation. Appendix appears normal. Vascular/Lymphatic: No abdominal aorta or iliac  aneurysm. Severe atherosclerotic plaque of the aorta and its branches. No abdominal, pelvic, or inguinal lymphadenopathy. Reproductive: Not visualized due to streak artifact originating from the right femoral surgical hardware. Other: No intraperitoneal free fluid. No intraperitoneal free gas. No organized fluid collection. Musculoskeletal: No abdominal wall hernia or abnormality. No suspicious lytic or blastic osseous lesions. No acute displaced fracture. Multilevel degenerative changes of the spine. Total right hip arthroplasty. IMPRESSION: 1. No acute intra-abdominal or intrapelvic abnormality. 2.  Aortic Atherosclerosis (ICD10-I70.0). Electronically Signed   By: MIven FinnM.D.   On: 04/05/2021 20:16   DG Chest Portable 1 View  Result Date: 04/05/2021 CLINICAL DATA:  Shortness of breath EXAM: PORTABLE CHEST 1 VIEW COMPARISON:  03/23/2021 FINDINGS: Left-sided central venous port with tip over the SVC. Cardiomegaly with vascular congestion. No consolidation, pleural effusion, or pneumothorax IMPRESSION: Cardiomegaly with vascular congestion Electronically Signed   By: KDonavan FoilM.D.   On: 04/05/2021 18:31      IMPRESSION AND PLAN:  Active Problems:   [place]enic [place]  1.  Neutropenic fever with associated SIRS possibly sepsis.  This is manifested by tachycardia and tachypnea. - The patient be admitted to a progressive unit bed. - We will continue empiric antibiotic therapy with IV cefepime, vancomycin and Flagyl pending blood cultures. - We will follow her CBC. -Hematology consult to be obtained. - Notify Dr. FGrayland Ormondabout the patient.  2.  Paroxysmal atrial fibrillation with rapid ventricle response. - We will continue the patient on IV Cardizem drip. - We  will continue her Eliquis. - The patient just had an echo with EF of 60 to 65% with grade 1 diastolic dysfunction last month. - Cardiology consult can be obtained if she has persistent atrial fibrillation.  3.   Anxiety. - We will continue Xanax and BuSpar as well as Cymbalta.  4.  GERD. - We will continue her PPI therapy.  5.  Essential hypertension. - We will continue Cozaar and HCTZ.  6.  Restless leg syndrome. - We will continue her Requip.   DVT prophylaxis: Lovenox. Code Status: full code. Family Communication:  The plan of care was discussed in details with the patient (and family). I answered all questions. The patient agreed to proceed with the above mentioned plan. Further management will depend upon hospital course. Disposition Plan: Back to previous home environment Consults called: Heme-onc All the records are reviewed and case discussed with ED provider.  Status is: Inpatient  Remains inpatient appropriate because:Hemodynamically unstable, Ongoing active pain requiring inpatient pain management, Ongoing diagnostic testing needed not appropriate for outpatient work up, Unsafe d/c plan, IV treatments appropriate due to intensity of illness or inability to take PO, and Inpatient level of care appropriate due to severity of illness  Dispo: The patient is from: Home              Anticipated d/c is to: Home              Patient currently is not medically stable to d/c.   Difficult to place patient No      TOTAL TIME TAKING CARE OF THIS PATIENT: 55 minutes.    Christel Mormon M.D on 04/05/2021 at 10:49 PM  [facility]   From 7 PM-7 AM, contact night-coverage [email or web address]  CC: Primary care physician; [name], FNP

## 2021-04-05 NOTE — Consult Note (Signed)
CODE SEPSIS - PHARMACY COMMUNICATION  **Broad Spectrum Antibiotics should be administered within 1 hour of Sepsis diagnosis**  Time Code Sepsis Called/Page Received: 1831  Antibiotics Ordered: Cefepime/Vanc/Metronidazole  Time of 1st antibiotic administration: 1906  Additional action taken by pharmacy: none  If necessary, Name of Provider/Nurse Contacted: [place] ,PharmD Clinical Pharmacist  04/05/2021  6:39 PM

## 2021-04-05 NOTE — Consult Note (Signed)
Pharmacy Antibiotic Note  Quintella Mura is a 70 y.o. female admitted on 04/05/2021 with sepsis(unk source).  Pharmacy has been consulted for vancomycin & cefepime dosing.  Plan: Vancomycin 2500mg  loading dose (1000mg  + 1500mg ); then 1500mg  q24h (est AUC 525.7 based on SCr 0.98; Vd 0.5 (BMI >40); IBW) MRSA PCR ordered; f/u and narrow vanc as appropriate Cefepime 2g x1; followed by cefepime 2g q8h Also, receiving Metronidazole 500mg  IV q12h   Temp (24hrs), Avg:99.2 F (37.3 C), Min:98.7 F (37.1 C), Max:99.7 F (37.6 C)  Recent Labs  Lab 03/31/21 0856 04/05/21 1738 04/05/21 2028  WBC 3.8* 1.0*  --   CREATININE 1.07* 0.98  --   LATICACIDVEN  --  1.3 1.0    Estimated Creatinine Clearance: 73.2 mL/min (by C-G formula based on SCr of 0.98 mg/dL).    Allergies  Allergen Reactions   Prednisone Other (See Comments)    Agitation, hyperactivity, polyphagia   Amiodarone Nausea And Vomiting   Hydrocodone-Acetaminophen Other (See Comments)    Hallucinations and combative   Oxycodone Itching    Can take it with benadryl   Tapentadol Itching   Tramadol Itching    Can take it with benadryl   Adhesive [Tape] Itching and Rash    Prefers paper tape   Latex Itching and Rash    use paper tap    Antimicrobials this admission: vancomycin 9/26 >>  Cefepime (9/26 >>  Metronidazole (9/26 >>  Dose adjustments this admission: CTM and adjust PRN  Microbiology results: 9/26 BCx: sent/pending 9/26 UCx: sent/pending  9/26 MRSA PCR: ordered 9/26 Flu/Covid - negative  Thank you for allowing pharmacy to be a part of this patient's care.  Lorna Dibble 04/05/2021 9:42 PM

## 2021-04-05 NOTE — ED Triage Notes (Signed)
Pt presents to ED from home C/O nausea, vomiting, nose bleeds since Saturday. Last chemo Wednesday.

## 2021-04-05 NOTE — ED Notes (Signed)
CRITICAL VALUE STICKER  CRITICAL VALUE:WBC:1, Absolute N. Count: 0.1  RECEIVER (on-site recipient of call):I .Jalina Blowers  DATE & TIME NOTIFIED: 09/26 1828  MESSENGER (representative from lab):Inda Merlin  MD NOTIFIED: Jari Pigg  TIME OF NOTIFICATION:1828  RESPONSE:

## 2021-04-06 DIAGNOSIS — D709 Neutropenia, unspecified: Principal | ICD-10-CM

## 2021-04-06 DIAGNOSIS — R112 Nausea with vomiting, unspecified: Secondary | ICD-10-CM | POA: Diagnosis not present

## 2021-04-06 DIAGNOSIS — R197 Diarrhea, unspecified: Secondary | ICD-10-CM | POA: Insufficient documentation

## 2021-04-06 DIAGNOSIS — R1110 Vomiting, unspecified: Secondary | ICD-10-CM | POA: Insufficient documentation

## 2021-04-06 DIAGNOSIS — R5081 Fever presenting with conditions classified elsewhere: Secondary | ICD-10-CM

## 2021-04-06 LAB — URINALYSIS, ROUTINE W REFLEX MICROSCOPIC
Bilirubin Urine: NEGATIVE
Glucose, UA: NEGATIVE mg/dL
Hgb urine dipstick: NEGATIVE
Ketones, ur: NEGATIVE mg/dL
Leukocytes,Ua: NEGATIVE
Nitrite: NEGATIVE
Protein, ur: NEGATIVE mg/dL
Specific Gravity, Urine: >1.046 — ABNORMAL HIGH (ref 1.005–1.030)
pH: 5 (ref 5.0–8.0)

## 2021-04-06 LAB — PROTIME-INR
INR: 1.3 — ABNORMAL HIGH (ref 0.8–1.2)
Prothrombin Time: 15.9 seconds — ABNORMAL HIGH (ref 11.4–15.2)

## 2021-04-06 LAB — CBC
HCT: 32.3 % — ABNORMAL LOW (ref 36.0–46.0)
Hemoglobin: 11.5 g/dL — ABNORMAL LOW (ref 12.0–15.0)
MCH: 32.1 pg (ref 26.0–34.0)
MCHC: 35.6 g/dL (ref 30.0–36.0)
MCV: 90.2 fL (ref 80.0–100.0)
Platelets: 131 10*3/uL — ABNORMAL LOW (ref 150–400)
RBC: 3.58 MIL/uL — ABNORMAL LOW (ref 3.87–5.11)
RDW: 12.2 % (ref 11.5–15.5)
WBC: 1.1 10*3/uL — CL (ref 4.0–10.5)
nRBC: 0 % (ref 0.0–0.2)

## 2021-04-06 LAB — CBC WITH DIFFERENTIAL/PLATELET
Abs Immature Granulocytes: 0.01 10*3/uL (ref 0.00–0.07)
Basophils Absolute: 0 10*3/uL (ref 0.0–0.1)
Basophils Relative: 3 %
Eosinophils Absolute: 0 10*3/uL (ref 0.0–0.5)
Eosinophils Relative: 1 %
HCT: 32.9 % — ABNORMAL LOW (ref 36.0–46.0)
Hemoglobin: 11.5 g/dL — ABNORMAL LOW (ref 12.0–15.0)
Immature Granulocytes: 1 %
Lymphocytes Relative: 77 %
Lymphs Abs: 0.8 10*3/uL (ref 0.7–4.0)
MCH: 31.9 pg (ref 26.0–34.0)
MCHC: 35 g/dL (ref 30.0–36.0)
MCV: 91.4 fL (ref 80.0–100.0)
Monocytes Absolute: 0 10*3/uL — ABNORMAL LOW (ref 0.1–1.0)
Monocytes Relative: 2 %
Neutro Abs: 0.2 10*3/uL — CL (ref 1.7–7.7)
Neutrophils Relative %: 16 %
Platelets: 142 10*3/uL — ABNORMAL LOW (ref 150–400)
RBC: 3.6 MIL/uL — ABNORMAL LOW (ref 3.87–5.11)
RDW: 12.6 % (ref 11.5–15.5)
WBC: 1.1 10*3/uL — CL (ref 4.0–10.5)
nRBC: 0 % (ref 0.0–0.2)

## 2021-04-06 LAB — BASIC METABOLIC PANEL
Anion gap: 7 (ref 5–15)
BUN: 12 mg/dL (ref 8–23)
CO2: 27 mmol/L (ref 22–32)
Calcium: 8 mg/dL — ABNORMAL LOW (ref 8.9–10.3)
Chloride: 104 mmol/L (ref 98–111)
Creatinine, Ser: 0.88 mg/dL (ref 0.44–1.00)
GFR, Estimated: >60 mL/min (ref >60)
Glucose, Bld: 113 mg/dL — ABNORMAL HIGH (ref 70–99)
Potassium: 3.5 mmol/L (ref 3.5–5.1)
Sodium: 138 mmol/L (ref 135–145)

## 2021-04-06 LAB — HIV ANTIBODY (ROUTINE TESTING W REFLEX): HIV Screen 4th Generation wRfx: NONREACTIVE

## 2021-04-06 LAB — PATHOLOGIST SMEAR REVIEW

## 2021-04-06 LAB — PROCALCITONIN: Procalcitonin: <0.1 ng/mL

## 2021-04-06 LAB — CORTISOL-AM, BLOOD: Cortisol - AM: 12.2 ug/dL (ref 6.7–22.6)

## 2021-04-06 LAB — MRSA NEXT GEN BY PCR, NASAL: MRSA by PCR Next Gen: NOT DETECTED

## 2021-04-06 MED ORDER — TBO-FILGRASTIM 480 MCG/0.8ML ~~LOC~~ SOSY
480.0000 ug | PREFILLED_SYRINGE | Freq: Once | SUBCUTANEOUS | Status: AC
  Administered 2021-04-06: 480 ug via SUBCUTANEOUS
  Filled 2021-04-06: qty 0.8

## 2021-04-06 MED ORDER — METOPROLOL TARTRATE 25 MG PO TABS
12.5000 mg | ORAL_TABLET | Freq: Two times a day (BID) | ORAL | Status: DC
  Administered 2021-04-06 – 2021-04-08 (×4): 12.5 mg via ORAL
  Filled 2021-04-06 (×4): qty 1

## 2021-04-06 MED ORDER — LORAZEPAM 0.5 MG PO TABS
0.5000 mg | ORAL_TABLET | Freq: Four times a day (QID) | ORAL | Status: DC | PRN
  Administered 2021-04-06 – 2021-04-08 (×3): 0.5 mg via ORAL
  Filled 2021-04-06 (×3): qty 1

## 2021-04-06 NOTE — ED Notes (Signed)
Pt resting comfortably in ED stretcher, stretcher locked in low position with side rails up x2. Call light and personal belongings in reach. Given warm blanket, denies further needs at this time. Discussed with pt that attending provider will be by to visit her shortly.

## 2021-04-06 NOTE — ED Notes (Signed)
Patient's HR noted to be from 99-132.  Drip increased at this time in order to meet order perimeters

## 2021-04-06 NOTE — ED Notes (Signed)
Pharmacy messaged to clarify antibiotic dosing schedule.

## 2021-04-06 NOTE — ED Notes (Signed)
Pt placed in recliner per her request. Peri care performed, brief changed and new purewick applied. Pt requests prn ativan, will administer. Personal belongings and call light in reach.

## 2021-04-06 NOTE — Consult Note (Addendum)
Hematology/Oncology Consult note [facility] [phone] [phone]  Patient Care Team: [name], FNP as PCP - General (Family Medicine) [name] [name], MD as PCP - Cardiology (Cardiology) Charletta Cousin, MD (Inactive) [name], [name], MD as Consulting Physician (Cardiology) Rico Junker, RN as Oncology Nurse Navigator   Name of the patient: Amber Barnes  [id number]  21-Apr-1951   Date of visit: 04/06/21 REASON FOR COSULTATION:  Neutropenic fever History of presenting illness-  70 y.o. female with PMH listed at below who presents to ER for evaluation of nausea vomiting diarrhea, and some abdominal pain.  She also has had a low-grade fever, reported to be febrile at home with a temperature of 100.3. In the emergency room, temperature was 99.7. Influenza and COVID-19 PCR came back negative.   CT abdomen pelvis was obtained for work-up of abdominal pain and result was negative for acute process. Blood work shows neutropenia with ANC of 0.1, blood culture was drawn.  UA was negative.  Culture pending. Patient was started on empiric antibiotics, IV cefepime, vancomycin and Flagyl.  IV antiemetics Patient reports that she ate a very small amount of breakfast and threw up. Patient was tachycardic, she has a history of chronic atrial fibrillation.  And was given Cardizem IV followed by drip.  Review of Systems  Constitutional:  Positive for appetite change and fatigue. Negative for chills and fever.  HENT:   Negative for hearing loss and voice change.   Eyes:  Negative for eye problems.  Respiratory:  Negative for chest tightness and cough.   Cardiovascular:  Negative for chest pain.  Gastrointestinal:  Positive for diarrhea, nausea and vomiting. Negative for abdominal distention, abdominal pain and blood in stool.  Endocrine: Negative for hot flashes.  Genitourinary:  Negative for difficulty urinating and frequency.    Musculoskeletal:  Negative for arthralgias.  Skin:  Negative for itching and rash.  Neurological:  Negative for extremity weakness.  Hematological:  Negative for adenopathy.  Psychiatric/Behavioral:  Negative for confusion.    Allergies  Allergen Reactions   Prednisone Other (See Comments)    Agitation, hyperactivity, polyphagia   Amiodarone Nausea And Vomiting   Hydrocodone-Acetaminophen Other (See Comments)    Hallucinations and combative   Oxycodone Itching    Can take it with benadryl   Tapentadol Itching   Tramadol Itching    Can take it with benadryl   Adhesive [Tape] Itching and Rash    Prefers paper tape   Latex Itching and Rash    use paper tap    Patient Active Problem List   Diagnosis Date Noted   Neutropenic fever ([place]) 04/05/2021   Encounter for antineoplastic chemotherapy 03/24/2021   Neuropathy 03/24/2021   Port-A-Cath in place 03/24/2021   HER2-positive carcinoma of breast ([place]) 01/26/2021   Goals of care, counseling/discussion 01/26/2021   Malignant neoplasm of upper-outer quadrant of right female breast ([place]) 01/19/2021   Left arm pain 12/02/2020   Soft tissue mass 12/02/2020   Candidal intertrigo 09/09/2020   Urinary frequency 09/09/2020   Night sweat 03/31/2020   Nasal drainage 12/18/2019   Sinus congestion 12/18/2019   Nausea 10/30/2019   Major depression in remission ([place]) 08/27/2018   Primary osteoarthritis involving multiple joints 08/27/2018   Atrial fibrillation ([place]) 08/27/2018   COPD (chronic obstructive pulmonary disease) ([place]) 08/27/2018   BMI 50.0-59.9, adult ([place]) 07/16/2018   Morbid obesity with BMI of 50.0-59.9, adult ([place]) 05/31/2018   Bradycardia 05/22/2017   Restless leg syndrome  04/13/2017   OSA (obstructive sleep apnea) 01/09/2017   Fatty liver disease, nonalcoholic 45/80/9983   Family history of ovarian cancer 06/08/2016   GERD (gastroesophageal reflux disease) 05/05/2016   Hypertension 11/11/2014   Anxiety and depression  11/11/2014   Weight loss 11/11/2014     Past Medical History:  Diagnosis Date   Achilles tendinitis    Acute medial meniscal tear    Allergy    Anxiety    Atrial fibrillation ([place])    a. new onset 07/2016; b. CHADS2VASc --> 4 (HTN, female, age, CHF); c. eliquis   CHF (congestive heart failure) (HCC)    Chronic anticoagulation    Apixaban   Chronic lower back pain    Colon polyp    Complication of anesthesia    Emergence delirium; hypoxia   COPD (chronic obstructive pulmonary disease) ([place])    Cystic breast    Depression    Endometriosis    Fibrocystic breast disease    GERD (gastroesophageal reflux disease)    History of stress test    a. 07/2016 MV: EF 55-65%. Small, mild defect  in mid anteroseptal, apical septal, and apical locations. No ischemia-->low risk.   Hyperlipidemia    Hypertension    Insomnia    Lipoma    Malignant neoplasm of upper-outer quadrant of right female breast (HCC)    Stage 1A invasive mammary carcinoma (cT1b or T1c N0); ER (-), PR (+), HER2/neu (+)   Migraine    Mild Mitral regurgitation    a. 07/2016 Echo: EF 55-65%, no rwma, mild MR. Mildly dil LA. Nl RV fx.    OSA (obstructive sleep apnea)    non-compliant with nocturnal PAP therapy   Osteoarthritis    left knee   Osteopenia    Peptic ulcer disease    a. H. pylori   Pneumonia    RLS (restless legs syndrome)    Tendonitis    Thyroid disease    Vitamin D deficiency      Past Surgical History:  Procedure Laterality Date   ABDOMINAL HYSTERECTOMY     ovaries left   ACHILLES TENDON SURGERY     2012,01/2017   BACK SURGERY     fusion lumbar   BREAST BIOPSY Right 01/15/2021   Affirm biopsy-"X" clip-INVASIVE MAMMARY CARCINOMA   BREAST BIOPSY Right 01/15/2021   u/s bx-"venus" clip INVASIVE MAMMARY CARCINOMA   BREAST BIOPSY     BREAST LUMPECTOMY,RADIO FREQ LOCALIZER,AXILLARY SENTINEL LYMPH NODE BIOPSY Right 02/17/2021   Procedure: BREAST LUMPECTOMY,RADIO FREQ LOCALIZER,AXILLARY SENTINEL  LYMPH NODE BIOPSY;  Surgeon: Ronny Bacon, MD;  Location: [facility];  Service: General;  Laterality: Right;   CARDIOVERSION N/A 09/07/2016   Procedure: CARDIOVERSION;  Surgeon: Minna Merritts, MD;  Location: [facility];  Service: Cardiovascular;  Laterality: N/A;   CARDIOVERSION N/A 10/19/2016   Procedure: CARDIOVERSION;  Surgeon: Minna Merritts, MD;  Location: [facility];  Service: Cardiovascular;  Laterality: N/A;   CARDIOVERSION N/A 12/14/2016   Procedure: CARDIOVERSION;  Surgeon: Minna Merritts, MD;  Location: [facility];  Service: Cardiovascular;  Laterality: N/A;   COLONOSCOPY     COLONOSCOPY WITH PROPOFOL N/A 05/13/2020   Procedure: COLONOSCOPY WITH PROPOFOL;  Surgeon: Toledo, Benay Pike, MD;  Location: [facility];  Service: Gastroenterology;  Laterality: N/A;  ELIQUIS   ESOPHAGOGASTRODUODENOSCOPY     ESOPHAGOGASTRODUODENOSCOPY (EGD) WITH PROPOFOL N/A 05/13/2020   Procedure: ESOPHAGOGASTRODUODENOSCOPY (EGD) WITH PROPOFOL;  Surgeon: Toledo, Benay Pike, MD;  Location: [facility];  Service: Gastroenterology;  Laterality: N/A;  FOOT SURGERY Left    tendon   JOINT REPLACEMENT Right    hip   PORTA CATH INSERTION N/A 03/23/2021   Procedure: PORTA CATH INSERTION;  Surgeon: Katha Cabal, MD;  Location: [facility];  Service: Cardiovascular;  Laterality: N/A;   PORTACATH PLACEMENT N/A 03/23/2021   Procedure: ATTEMPTED INSERTION PORT-A-CATH;  Surgeon: Ronny Bacon, MD;  Location: [facility];  Service: General;  Laterality: N/A;   THYROIDECTOMY     thyroid lobectomy secondary to a benign nodule   TOTAL HIP ARTHROPLASTY Right 08/21/2018   Procedure: TOTAL HIP ARTHROPLASTY ANTERIOR APPROACH-RIGHT CELL SAVER REQUESTED;  Surgeon: [name], MD;  Location: [facility];  Service: Orthopedics;  Laterality: Right;    Social History   Socioeconomic History   Marital status: Married    Spouse name: jeffrey   Number of children: 1   Years of education: Not on file    Highest education level: High school graduate  Occupational History    Comment: disabled  Tobacco Use   Smoking status: Former    Packs/day: 0.25    Years: 10.00    Pack years: 2.50    Types: Cigarettes    Quit date: 2005    Years since quitting: 17.7   Smokeless tobacco: Never   Tobacco comments:    smoked for 10 years, 1 pack every 2-3 days  Vaping Use   Vaping Use: Never used  Substance and Sexual Activity   Alcohol use: Not Currently    Alcohol/week: 1.0 standard drink    Types: 1 Glasses of wine per week    Comment: rarely   Drug use: No   Sexual activity: Not Currently  Other Topics Concern   Not on file  Social History Narrative   Moved to [place] from [place], originally from [place].    1 cup of caffeine daily   Right handed          Social Determinants of Health   Financial Resource Strain: Low Risk    Difficulty of Paying Living Expenses: Not very hard  Food Insecurity: No Food Insecurity   Worried About Charity fundraiser in the Last Year: Never true   Ran Out of Food in the Last Year: Never true  Transportation Needs: No Transportation Needs   Lack of Transportation (Medical): No   Lack of Transportation (Non-Medical): No  Physical Activity: Unknown   Days of Exercise per Week: 0 days   Minutes of Exercise per Session: Not on file  Stress: Stress Concern Present   Feeling of Stress : Rather much  Social Connections: Moderately Isolated   Frequency of Communication with Friends and Family: Never   Frequency of Social Gatherings with Friends and Family: Once a week   Attends Religious Services: More than 4 times per year   Active Member of [employer] or Organizations: No   Attends Archivist Meetings: Never   Marital Status: Married  Human resources officer Violence: Not At Risk   Fear of Current or Ex-Partner: No   Emotionally Abused: No   Physically Abused: No   Sexually Abused: No     Family History  Problem Relation Age  of Onset   Cancer Sister 67       ovarian   Depression Sister    Cancer Brother        CLL   Cancer Father 60       colon   Heart disease Father    Heart attack  Father 26       died from MI   Hyperlipidemia Father    Hypertension Father    Cancer Mother        leukemia   Breast cancer Paternal Aunt        64's   Migraines Neg Hx      Current Facility-Administered Medications:    acetaminophen (TYLENOL) tablet 650 mg, 650 mg, Oral, Q6H PRN **OR** acetaminophen (TYLENOL) suppository 650 mg, 650 mg, Rectal, Q6H PRN, Mansy, Jan A, MD   albuterol (PROVENTIL) (2.5 MG/3ML) 0.083% nebulizer solution 2.5 mg, 2.5 mg, Inhalation, Q8H PRN, Mansy, Jan A, MD   ALPRAZolam Duanne Moron) tablet 0.25 mg, 0.25 mg, Oral, Daily PRN, Mansy, Jan A, MD   apixaban Arne Cleveland) tablet 5 mg, 5 mg, Oral, BID, Mansy, Jan A, MD, 5 mg at 04/06/21 0941   azelastine (ASTELIN) 0.1 % nasal spray 2 spray, 2 spray, Each Nare, PRN, Mansy, Arvella Merles, MD   B-complex with vitamin C tablet, , Oral, Daily, Mansy, Jan A, MD, 1 tablet at 04/06/21 0948   busPIRone (BUSPAR) tablet 5 mg, 5 mg, Oral, TID PRN, Mansy, Jan A, MD   calcium-vitamin D (OSCAL WITH D) 500-200 MG-UNIT per tablet 1 tablet, 1 tablet, Oral, Q breakfast, Mansy, Jan A, MD, 1 tablet at 04/06/21 0942   ceFEPIme (MAXIPIME) 2 g in sodium chloride 0.9 % 100 mL IVPB, 2 g, Intravenous, Q8H, Beers, Brandon D, RPH, Last Rate: 200 mL/hr at 04/06/21 1352, 2 g at 04/06/21 1352   cholecalciferol (VITAMIN D3) tablet 5,000 Units, 5,000 Units, Oral, Daily, Mansy, Jan A, MD, 5,000 Units at 04/06/21 7353   diltiazem (CARDIZEM) 125 mg in dextrose 5% 125 mL (1 mg/mL) infusion, 5-15 mg/hr, Intravenous, Continuous, [place], MD, Last Rate: 10 mL/hr at 04/06/21 1352, 10 mg/hr at 04/06/21 1352   DULoxetine (CYMBALTA) DR capsule 30 mg, 30 mg, Oral, BID, Mansy, Jan A, MD, 30 mg at 04/06/21 2992   furosemide (LASIX) tablet 20 mg, 20 mg, Oral, BID, Mansy, Jan A, MD, 20 mg at 04/06/21 4268    hydrochlorothiazide (MICROZIDE) capsule 12.5 mg, 12.5 mg, Oral, Daily, Mansy, Jan A, MD, 12.5 mg at 04/06/21 3419   LORazepam (ATIVAN) tablet 0.5 mg, 0.5 mg, Oral, Q8H PRN, Mansy, Jan A, MD, 0.5 mg at 04/06/21 6222   losartan (COZAAR) tablet 100 mg, 100 mg, Oral, Daily, Mansy, Jan A, MD, 100 mg at 04/06/21 0940   magnesium hydroxide (MILK OF MAGNESIA) suspension 30 mL, 30 mL, Oral, Daily PRN, Mansy, Jan A, MD   metroNIDAZOLE (FLAGYL) IVPB 500 mg, 500 mg, Intravenous, Q12H, Nicole Kindred A, DO, Stopped at 04/06/21 1127   multivitamin with minerals tablet 1 tablet, 1 tablet, Oral, Daily, Mansy, Jan A, MD, 1 tablet at 04/06/21 0941   ondansetron (ZOFRAN) tablet 4 mg, 4 mg, Oral, Q6H PRN **OR** ondansetron (ZOFRAN) injection 4 mg, 4 mg, Intravenous, Q6H PRN, Mansy, Jan A, MD, 4 mg at 04/06/21 0935   pantoprazole (PROTONIX) EC tablet 40 mg, 40 mg, Oral, Daily, Mansy, Jan A, MD, 40 mg at 04/06/21 0941   potassium chloride SA (KLOR-CON) CR tablet 20 mEq, 20 mEq, Oral, BID, Mansy, Jan A, MD, 20 mEq at 04/06/21 9798   prochlorperazine (COMPAZINE) tablet 10 mg, 10 mg, Oral, Q6H PRN, Mansy, Jan A, MD   promethazine (PHENERGAN) tablet 25 mg, 25 mg, Oral, Q6H PRN, Mansy, Jan A, MD, 25 mg at 04/06/21 1359   ranolazine (RANEXA) 12 hr tablet 500 mg, 500 mg,  Oral, BID, Mansy, Jan A, MD, 500 mg at 04/06/21 6789   rOPINIRole (REQUIP) tablet 1 mg, 1 mg, Oral, QHS, Mansy, Jan A, MD   rosuvastatin (CRESTOR) tablet 20 mg, 20 mg, Oral, Daily, Mansy, Jan A, MD, 20 mg at 04/06/21 0948   SUMAtriptan (IMITREX) tablet 50 mg, 50 mg, Oral, Q2H PRN, Mansy, Jan A, MD   traZODone (DESYREL) tablet 25 mg, 25 mg, Oral, QHS PRN, Mansy, Jan A, MD   vancomycin (VANCOCIN) IVPB 1000 mg/200 mL premix, 1,000 mg, Intravenous, Once **FOLLOWED BY** [COMPLETED] vancomycin (VANCOREADY) IVPB 1500 mg/300 mL, 1,500 mg, Intravenous, Once, Lorna Dibble, RPH, Stopped at 04/06/21 3810   vancomycin (VANCOREADY) IVPB 1500 mg/300 mL, 1,500 mg,  Intravenous, Q24H, Beers, Shanon Brow, RPH  Current Outpatient Medications:    B Complex Vitamins (VITAMIN B COMPLEX PO), Take 1 tablet by mouth daily. , Disp: , Rfl:    bisoprolol (ZEBETA) 5 MG tablet, TAKE 1 TABLET BY MOUTH IN THE MORNING AND MAY TAKE AN EXTRA IN THE EVENING IF NEEDED FOR HEART RATE, Disp: 180 tablet, Rfl: 1   busPIRone (BUSPAR) 5 MG tablet, TAKE 1 TABLET BY MOUTH THREE TIMES A DAY AS NEEDED, Disp: 60 tablet, Rfl: 1   calcium gluconate 500 MG tablet, Take 500 tablets by mouth daily., Disp: , Rfl:    calcium-vitamin D (OSCAL WITH D) 500-200 MG-UNIT tablet, Take 1 tablet by mouth daily with breakfast., Disp: , Rfl:    Cholecalciferol (VITAMIN D3) 5000 units CAPS, Take 5,000 Units by mouth daily., Disp: , Rfl:    DULoxetine (CYMBALTA) 30 MG capsule, Take two tablets by mouth for a total of 60 MG daily. (Patient taking differently: Take 30 mg by mouth 2 (two) times daily.), Disp: 180 capsule, Rfl: 2   ELIQUIS 5 MG TABS tablet, TAKE 1 TABLET BY MOUTH TWICE A DAY, Disp: 180 tablet, Rfl: 1   esomeprazole (NEXIUM) 20 MG capsule, Take 20 mg by mouth as needed., Disp: , Rfl:    furosemide (LASIX) 20 MG tablet, Take 1 tablet (20 mg total) by mouth 2 (two) times daily., Disp: 180 tablet, Rfl: 0   hydrochlorothiazide (MICROZIDE) 12.5 MG capsule, TAKE 1 CAPSULE BY MOUTH EVERY DAY, Disp: 90 capsule, Rfl: 0   LORazepam (ATIVAN) 0.5 MG tablet, Take 1 tablet (0.5 mg total) by mouth every 8 (eight) hours as needed for anxiety (nausea/ vomiting)., Disp: 60 tablet, Rfl: 0   losartan (COZAAR) 100 MG tablet, TAKE 1 TABLET BY MOUTH DAILY. PLEASE CALL OFFICE TO SCHEDULE AN APPOINTMENT FOR FURTHER REFILLS, Disp: 90 tablet, Rfl: 0   Multiple Vitamin (MULTIVITAMIN) tablet, Take 1 tablet by mouth daily., Disp: , Rfl:    potassium chloride SA (KLOR-CON) 20 MEQ tablet, Take 1 tablet (20 mEq total) by mouth 2 (two) times daily., Disp: 120 tablet, Rfl: 2   ranolazine (RANEXA) 500 MG 12 hr tablet, TAKE 1 TABLET BY  MOUTH TWICE A DAY, Disp: 180 tablet, Rfl: 0   rosuvastatin (CRESTOR) 20 MG tablet, TAKE 1 TABLET BY MOUTH EVERY DAY, Disp: 90 tablet, Rfl: 0   acetaminophen (TYLENOL) 500 MG tablet, Take 500 mg by mouth every 6 (six) hours as needed. Taking as needed, Disp: , Rfl:    albuterol (VENTOLIN HFA) 108 (90 Base) MCG/ACT inhaler, INHALE 2 PUFFS INTO THE LUNGS EVERY 8 (EIGHT) HOURS AS NEEDED FOR WHEEZING OR SHORTNESS OF BREATH., Disp: 6.7 g, Rfl: 0   ALPRAZolam (XANAX) 0.25 MG tablet, TAKE 1 TABLET BY MOUTH DAILY AS NEEDED FOR  ANXIETY OR SLEEP. (Patient not taking: Reported on 04/06/2021), Disp: 30 tablet, Rfl: 2   azelastine (ASTELIN) 0.1 % nasal spray, Place into both nostrils as needed for rhinitis. Use in each nostril as directed, Disp: , Rfl:    lidocaine-prilocaine (EMLA) cream, Apply to affected area once, Disp: 30 g, Rfl: 3   ondansetron (ZOFRAN) 8 MG tablet, Take 1 tablet (8 mg total) by mouth every 8 (eight) hours as needed for nausea or vomiting., Disp: 60 tablet, Rfl: 0   prochlorperazine (COMPAZINE) 10 MG tablet, Take 1 tablet (10 mg total) by mouth every 6 (six) hours as needed (Nausea or vomiting)., Disp: 30 tablet, Rfl: 1   promethazine (PHENERGAN) 25 MG tablet, Take 1 tablet (25 mg total) by mouth every 6 (six) hours as needed for nausea or vomiting., Disp: 60 tablet, Rfl: 0   rizatriptan (MAXALT) 10 MG tablet, TAKE 1 TABLET (10 MG TOTAL) BY MOUTH AS NEEDED FOR MIGRAINE. MAY REPEAT IN 2 HOURS IF NEEDED, Disp: 10 tablet, Rfl: 0   rOPINIRole (REQUIP) 1 MG tablet, TAKE 1 TABLET BY MOUTH AT BEDTIME., Disp: 90 tablet, Rfl: 1   Physical exam:  Vitals:   04/06/21 0730 04/06/21 0800 04/06/21 1115 04/06/21 1230  BP: (!) 117/95 (!) 115/59 131/86 112/82  Pulse: 79 93 (!) 108 70  Resp: 19 15 (!) 7 17  Temp:      TempSrc:      SpO2: 97% 96% 96% 93%   Physical Exam Constitutional:      General: She is not in acute distress.    Appearance: She is obese. She is not diaphoretic.  HENT:     Head:  Normocephalic and atraumatic.     Nose: Nose normal.     Mouth/Throat:     Pharynx: No oropharyngeal exudate.  Eyes:     General: No scleral icterus.    Pupils: Pupils are equal, round, and reactive to light.  Cardiovascular:     Rate and Rhythm: Normal rate and regular rhythm.     Heart sounds: No murmur heard. Pulmonary:     Effort: Pulmonary effort is normal. No respiratory distress.     Breath sounds: No rales.  Chest:     Chest wall: No tenderness.  Abdominal:     General: There is no distension.     Palpations: Abdomen is soft.     Tenderness: There is no abdominal tenderness.  Musculoskeletal:        General: Normal range of motion.     Cervical back: Normal range of motion and neck supple.  Skin:    General: Skin is warm and dry.     Findings: No erythema.  Neurological:     Mental Status: She is alert and oriented to person, place, and time.     Cranial Nerves: No cranial nerve deficit.     Motor: No abnormal muscle tone.     Coordination: Coordination normal.  Psychiatric:        Mood and Affect: Affect normal.        CMP Latest Ref Rng & Units 04/06/2021  Glucose 70 - 99 mg/dL 113(H)  BUN 8 - 23 mg/dL 12  Creatinine 0.44 - 1.00 mg/dL 0.88  Sodium 135 - 145 mmol/L 138  Potassium 3.5 - 5.1 mmol/L 3.5  Chloride 98 - 111 mmol/L 104  CO2 22 - 32 mmol/L 27  Calcium 8.9 - 10.3 mg/dL 8.0(L)  Total Protein 6.5 - 8.1 g/dL -  Total Bilirubin 0.3 -  1.2 mg/dL -  Alkaline Phos 38 - 126 U/L -  AST 15 - 41 U/L -  ALT 0 - 44 U/L -   CBC Latest Ref Rng & Units 04/06/2021  WBC 4.0 - 10.5 K/uL 1.1(LL)  Hemoglobin 12.0 - 15.0 g/dL 11.5(L)  Hematocrit 36.0 - 46.0 % 32.3(L)  Platelets 150 - 400 K/uL 131(L)    RADIOGRAPHIC STUDIES: I have personally reviewed the radiological images as listed and agreed with the findings in the report. CT HEAD WO CONTRAST (5MM)  Result Date: 04/05/2021 CLINICAL DATA:  Dizziness nausea vomiting EXAM: CT HEAD WITHOUT CONTRAST TECHNIQUE:  Contiguous axial images were obtained from the base of the skull through the vertex without intravenous contrast. COMPARISON:  CT brain 04/17/2018 FINDINGS: Brain: No evidence of acute infarction, hemorrhage, hydrocephalus, extra-axial collection or mass lesion/mass effect. Vascular: No hyperdense vessels. Mild carotid vascular calcification Skull: Normal. Negative for fracture or focal lesion. Sinuses/Orbits: No acute finding. Other: None IMPRESSION: Negative non contrasted CT appearance of the brain Electronically Signed   By: Donavan Foil M.D.   On: 04/05/2021 18:33   CT ABDOMEN PELVIS W CONTRAST  Result Date: 04/05/2021 CLINICAL DATA:  C/O nausea, vomiting, nose bleeds since Saturday. Last chemo Wednesday. EXAM: CT ABDOMEN AND PELVIS WITH CONTRAST TECHNIQUE: Multidetector CT imaging of the abdomen and pelvis was performed using the standard protocol following bolus administration of intravenous contrast. CONTRAST:  169m OMNIPAQUE IOHEXOL 350 MG/ML SOLN COMPARISON:  CT abdomen pelvis 11/05/2019 FINDINGS: Lower chest: No acute abnormality. Hepatobiliary: No focal liver abnormality. No gallstones, gallbladder wall thickening, or pericholecystic fluid. No biliary dilatation. Pancreas: No focal lesion. Normal pancreatic contour. No surrounding inflammatory changes. No main pancreatic ductal dilatation. Spleen: Normal in size without focal abnormality. Adrenals/Urinary Tract: No adrenal nodule bilaterally. Bilateral kidneys enhance symmetrically. There is a 2 cm fluid density lesion within the left kidney that likely represents a simple renal cyst. Subcentimeter hypodensities are too small to characterize. Punctate calcification within the right kidney. No hydronephrosis. No hydroureter. The urinary bladder is unremarkable. On delayed imaging, there is no urothelial wall thickening and there are no filling defects in the opacified portions of the bilateral collecting systems or ureters. Stomach/Bowel: Stomach is  within normal limits. No evidence of bowel wall thickening or dilatation. Appendix appears normal. Vascular/Lymphatic: No abdominal aorta or iliac aneurysm. Severe atherosclerotic plaque of the aorta and its branches. No abdominal, pelvic, or inguinal lymphadenopathy. Reproductive: Not visualized due to streak artifact originating from the right femoral surgical hardware. Other: No intraperitoneal free fluid. No intraperitoneal free gas. No organized fluid collection. Musculoskeletal: No abdominal wall hernia or abnormality. No suspicious lytic or blastic osseous lesions. No acute displaced fracture. Multilevel degenerative changes of the spine. Total right hip arthroplasty. IMPRESSION: 1. No acute intra-abdominal or intrapelvic abnormality. 2.  Aortic Atherosclerosis (ICD10-I70.0). Electronically Signed   By: MIven FinnM.D.   On: 04/05/2021 20:16   PERIPHERAL VASCULAR CATHETERIZATION  Result Date: 03/23/2021 See surgical note for result.  DG Chest Portable 1 View  Result Date: 04/05/2021 CLINICAL DATA:  Shortness of breath EXAM: PORTABLE CHEST 1 VIEW COMPARISON:  03/23/2021 FINDINGS: Left-sided central venous port with tip over the SVC. Cardiomegaly with vascular congestion. No consolidation, pleural effusion, or pneumothorax IMPRESSION: Cardiomegaly with vascular congestion Electronically Signed   By: KDonavan FoilM.D.   On: 04/05/2021 18:31   DG Chest Port 1 View  Result Date: 03/23/2021 CLINICAL DATA:  Pneumothorax EXAM: PORTABLE CHEST 1 VIEW COMPARISON:  11/05/2019 FINDINGS: Lung  volumes are small but are symmetric and are clear. No pneumothorax or pleural effusion. Mild cardiomegaly is stable. Pulmonary vascularity is normal. IMPRESSION: No pneumothorax. Stable cardiomegaly Electronically Signed   By: Fidela Salisbury M.D.   On: 03/23/2021 14:35   ECHOCARDIOGRAM COMPLETE  Result Date: 03/08/2021    ECHOCARDIOGRAM REPORT   Patient Name:   DAMESHIA SEYBOLD Date of Exam: 03/08/2021 Medical Rec  #:  [id number]        Height:       65.0 in Accession #:    [id number]       Weight:       286.0 lb Date of Birth:  07-Dec-1950        BSA:          2.303 m Patient Age:    71 years         BP:           134/78 mmHg Patient Gender: F                HR:           55 bpm. Exam Location:  [facility] Procedure: 2D Echo, Color Doppler, Cardiac Doppler and Strain Analysis Indications:     Chemo Z09  History:         Patient has prior history of Echocardiogram examinations, most                  recent 07/27/2016. Risk Factors:Hypertension. Mitral valve                  regurgitation.  Sonographer:     Sherrie Sport Referring Phys:  [id number] [place] Zineb Glade Diagnosing Phys: Kathlyn [place] MD  Sonographer Comments: Global longitudinal strain was attempted. IMPRESSIONS  1. Left ventricular ejection fraction, by estimation, is 60 to 65%. The left ventricle has normal function. Left ventricular endocardial border not optimally defined to evaluate regional wall motion. There is mild left ventricular hypertrophy. Left ventricular diastolic parameters are consistent with Grade I diastolic dysfunction (impaired relaxation). The average left ventricular global longitudinal strain is -16.3 %. The global longitudinal strain is normal.  2. Right ventricular systolic function is normal. The right ventricular size is normal.  3. Left atrial size was mildly dilated.  4. The mitral valve is normal in structure. Trivial mitral valve regurgitation. No evidence of mitral stenosis.  5. The aortic valve is normal in structure. Aortic valve regurgitation is not visualized. No aortic stenosis is present. FINDINGS  Left Ventricle: Left ventricular ejection fraction, by estimation, is 60 to 65%. The left ventricle has normal function. Left ventricular endocardial border not optimally defined to evaluate regional wall motion. The average left ventricular global longitudinal strain is -16.3 %. The global longitudinal strain is normal. The left ventricular internal  cavity size was normal in size. There is mild left ventricular hypertrophy. Left ventricular diastolic parameters are consistent with Grade I diastolic dysfunction (impaired relaxation). Right Ventricle: The right ventricular size is normal. No increase in right ventricular wall thickness. Right ventricular systolic function is normal. Left Atrium: Left atrial size was mildly dilated. Right Atrium: Right atrial size was normal in size. Pericardium: There is no evidence of pericardial effusion. Mitral Valve: The mitral valve is normal in structure. Mild mitral annular calcification. Trivial mitral valve regurgitation. No evidence of mitral valve stenosis. Tricuspid Valve: The tricuspid valve is normal in structure. Tricuspid valve regurgitation is mild . No evidence of tricuspid stenosis. Aortic Valve: The aortic valve is normal in  structure. Aortic valve regurgitation is not visualized. No aortic stenosis is present. Aortic valve mean gradient measures 4.7 mmHg. Aortic valve peak gradient measures 8.4 mmHg. Aortic valve area, by VTI measures 1.88 cm. Pulmonic Valve: The pulmonic valve was normal in structure. Pulmonic valve regurgitation is mild. No evidence of pulmonic stenosis. Aorta: The aortic root is normal in size and structure. Venous: The inferior vena cava was not well visualized. IAS/Shunts: No atrial level shunt detected by color flow Doppler.  LEFT VENTRICLE PLAX 2D LVIDd:         4.30 cm  Diastology LVIDs:         2.61 cm  LV e' medial:    5.87 cm/s LV PW:         1.92 cm  LV E/e' medial:  12.5 LV IVS:        0.97 cm  LV e' lateral:   9.68 cm/s LVOT diam:     2.00 cm  LV E/e' lateral: 7.6 LV SV:         63 LV SV Index:   28       2D Longitudinal Strain LVOT Area:     3.14 cm 2D Strain GLS Avg:     -16.3 %                          3D Volume EF:                         3D EF:        60 %                         LV EDV:       274 ml                         LV ESV:       110 ml                         LV  SV:        165 ml RIGHT VENTRICLE RV Basal diam:  2.92 cm RV S prime:     13.30 cm/s TAPSE (M-mode): 4.5 cm LEFT ATRIUM           Index       RIGHT ATRIUM          Index LA diam:      4.30 cm 1.87 cm/m  RA Area:     9.16 cm LA Vol (A2C): 29.7 ml 12.90 ml/m RA Volume:   15.60 ml 6.77 ml/m LA Vol (A4C): 69.1 ml 30.01 ml/m  AORTIC VALVE                    PULMONIC VALVE AV Area (Vmax):    1.85 cm     PV Vmax:        0.63 m/s AV Area (Vmean):   1.88 cm     PV Peak grad:   1.6 mmHg AV Area (VTI):     1.88 cm     RVOT Peak grad: 3 mmHg AV Vmax:           145.00 cm/s AV Vmean:          100.433 cm/s AV VTI:            0.338 m  AV Peak Grad:      8.4 mmHg AV Mean Grad:      4.7 mmHg LVOT Vmax:         85.50 cm/s LVOT Vmean:        60.000 cm/s LVOT VTI:          0.202 m LVOT/AV VTI ratio: 0.60  AORTA Ao Root diam: 3.57 cm MITRAL VALVE               TRICUSPID VALVE MV Area (PHT): 2.63 cm    TR Peak grad:   24.6 mmHg MV Decel Time: 288 msec    TR Vmax:        248.00 cm/s MV E velocity: 73.30 cm/s MV A velocity: 88.30 cm/s  SHUNTS MV E/A ratio:  0.83        Systemic VTI:  0.20 m                            Systemic Diam: 2.00 cm Kathlyn Sacramento MD Electronically signed by Kathlyn Sacramento MD Signature Date/Time: 03/08/2021/1:46:14 PM    Final     Assessment and plan-   #Neutropenic fever Neutropenia is likely due to recent chemotherapy A1c prior to today.  Discussed with patient recommend Granix 480 MCG x1 today.  Repeat CBC with differential in AM.  I will order additional doses if needed. Continue empiric antibiotics, blood cultures -no growth in 24 hours, urine culture is pending  #Acute on chronic nausea vomiting. Patient has tried multiple antiemetics at home not able to control her symptoms. Continue as needed IV Zofran as needed, patient also has oral as needed Phenergan and Compazine  #Anxiety/insomnia/nausea vomiting Recommend Ativan 0.5 mg every 6 hours as needed.  #Diarrhea, possibly due to  chemotherapy.  Check C. difficile and a GI panel. #HER2 positive breast cancer on adjuvant Taxol/trastuzumab. Hold chemotherapy due to acute issues.  Patient will follow-up with me outpatient   Thank you for allowing me to participate in the care of this patient.   [name], MD, PhD 04/06/2021

## 2021-04-06 NOTE — ED Notes (Signed)
This RN went into this pt's room upon seeing red assistance light on outside of door. Upon entering, pt was seated on BSC and had vomited into floor. Pt stated they became nauseous while having a bowel movement and couldn't hold it in. This RN cleaned up emesis on floor and reset call bell. Pt stated they were not sure they were done with their bowel movement and wanted to stay on the [facility]. This RN provided wet washcloth to wipe face with, and instructed pt on use of call bell.

## 2021-04-06 NOTE — Progress Notes (Signed)
PROGRESS NOTE    Amber Barnes   XTK:[id number]  DOB: 29-Sep-1950  PCP: [name], FNP    DOA: 04/05/2021 LOS: 1    Brief Narrative / Hospital Course to Date:   70 year old female with past medical history of chronic A. fib, recently diagnosed breast cancer currently undergoing chemotherapy, hypertension, CHF, hyperlipidemia, and other comorbidities who presented to the ED on 04/05/2021 for evaluation of fever in addition to nausea vomiting and cramping abdominal pain.  She had some dyspnea and palpitations without chest pain.  Most recent chemo treatment was on Wednesday 9/21.  Patient follows with Dr. Tasia Catchings for oncology.  In the ED, patient was found to be neutropenic.  CT abdomen pelvis was unremarkable.  Chest x-ray with some vascular congestion but otherwise negative.  Treated with empiric broad-spectrum antibiotics and IV antiemetics in the ED in addition to IV fluids.  Admitted to [facility] service for further evaluation and management of neutropenic fever and A. fib with RVR.  Assessment & Plan   Active Problems:   Neutropenic fever (HCC)   Neutropenic fever with associated SIRS -so far no source of infection to suggest sepsis.   -- Continue cefepime and vancomycin --Can stop Flagyl after today's dose, low suspicion for anaerobic infection --Follow CBC with differentials --Oncology is following, appreciate assistance  - Granix today --Neutropenic precautions --Antipyretics as needed  Refractory nausea -patient reportedly has been having nausea for some time is been difficult to control despite multiple antiemetics. --IV Zofran as needed --Also has oral Phenergan or Compazine if Zofran not helpful  Diarrhea -suspect secondary to chemotherapy but given cramping abdominal pain and copious watery BMs, will check for C. difficile and GI panel.  If these are negative can give Imodium.  Paroxysmal A. fib with RVR -present on admission, likely triggered by acute  illness. Recent echo EF 60 to 65% with grade 1 diastolic dysfunction. Appears not on a rate controlling agent at home --Continue Cardizem drip  --Start oral Lopressor 12.5 mg twice daily --Wean off Cardizem drip as HR tolerates -- Continue Eliquis --Consider cardiology consult if persistent  Anxiety -continue home Xanax BuSpar and Cymbalta  GERD -continue PPI  Essential hypertension -continued on home losartan and HCTZ on admission.  Hold HCTZ given starting metoprolol and on Cardizem drip.  Monitor BP  Restless leg syndrome -continue Requip  Hyperlipidemia- continue Crestor    Patient BMI: There is no height or weight on file to calculate BMI.   DVT prophylaxis:  apixaban (ELIQUIS) tablet 5 mg   Diet:  Diet Orders (From admission, onward)     Start     Ordered   04/05/21 2134  Diet Heart Room service appropriate? Yes; Fluid consistency: Thin  Diet effective now       Question Answer Comment  Room service appropriate? Yes   Fluid consistency: Thin      04/05/21 2136              Code Status: Full Code   Subjective 04/06/21    Patient is in the ED holding for bed today.  She reports ongoing abdominal cramping pain and watery diarrhea.  She is felt progressively more weak today and had difficulty making it to the toilet.  Still feels chills at times.  Otherwise doing okay.  No acute events reported   Disposition Plan & Communication   Status is: Inpatient  Remains inpatient appropriate because:IV treatments appropriate due to intensity of illness or inability to take PO  Dispo: The  patient is from: Home              Anticipated d/c is to: Home              Patient currently is not medically stable to d/c.   Difficult to place patient No  Consults, Procedures, Significant Events   Consultants:  Oncology  Procedures:  None  Antimicrobials:  Anti-infectives (From admission, onward)    Start     Dose/Rate Route Frequency Ordered Stop   04/06/21 1600   vancomycin (VANCOREADY) IVPB 1500 mg/300 mL        1,500 mg 150 mL/hr over 120 Minutes Intravenous Every 24 hours 04/05/21 2156 04/13/21 1559   04/06/21 0300  ceFEPIme (MAXIPIME) 2 g in sodium chloride 0.9 % 100 mL IVPB        2 g 200 mL/hr over 30 Minutes Intravenous Every 8 hours 04/05/21 2158     04/05/21 2315  vancomycin (VANCOREADY) IVPB 1500 mg/300 mL       See Hyperspace for full Linked Orders Report.   1,500 mg 150 mL/hr over 120 Minutes Intravenous  Once 04/05/21 2145 04/06/21 0623   04/05/21 2200  ceFEPIme (MAXIPIME) 2 g in sodium chloride 0.9 % 100 mL IVPB  Status:  Discontinued        2 g 200 mL/hr over 30 Minutes Intravenous  Once 04/05/21 2136 04/06/21 0044   04/05/21 2200  metroNIDAZOLE (FLAGYL) IVPB 500 mg        500 mg 100 mL/hr over 60 Minutes Intravenous Every 12 hours 04/05/21 2136 04/07/21 0959   04/05/21 2145  vancomycin (VANCOCIN) IVPB 1000 mg/200 mL premix  Status:  Discontinued        1,000 mg 200 mL/hr over 60 Minutes Intravenous  Once 04/05/21 2136 04/05/21 2145   04/05/21 2145  vancomycin (VANCOCIN) IVPB 1000 mg/200 mL premix       See Hyperspace for full Linked Orders Report.   1,000 mg 200 mL/hr over 60 Minutes Intravenous  Once 04/05/21 2145     04/05/21 2000  vancomycin (VANCOREADY) IVPB 1500 mg/300 mL  Status:  Discontinued        1,500 mg 150 mL/hr over 120 Minutes Intravenous  Once 04/05/21 1842 04/05/21 2145   04/05/21 1845  ceFEPIme (MAXIPIME) 2 g in sodium chloride 0.9 % 100 mL IVPB        2 g 200 mL/hr over 30 Minutes Intravenous  Once 04/05/21 1831 04/05/21 2013   04/05/21 1845  metroNIDAZOLE (FLAGYL) IVPB 500 mg        500 mg 100 mL/hr over 60 Minutes Intravenous  Once 04/05/21 1831 04/05/21 2132   04/05/21 1845  vancomycin (VANCOCIN) IVPB 1000 mg/200 mL premix  Status:  Discontinued        1,000 mg 200 mL/hr over 60 Minutes Intravenous  Once 04/05/21 1831 04/05/21 2340         Micro    Objective   Vitals:   04/06/21 1230 04/06/21  1430 04/06/21 1530 04/06/21 1700  BP: 112/82 113/73 116/71 (!) 142/80  Pulse: 70 (!) 102 92   Resp: 17 16 16    Temp:      TempSrc:      SpO2: 93% 100% 96%     Intake/Output Summary (Last 24 hours) at 04/06/2021 1701 Last data filed at 04/06/2021 1127 Gross per 24 hour  Intake 2800 ml  Output --  Net 2800 ml   There were no vitals filed for this visit.  Physical  Exam:  General exam: awake, alert, no acute distress, obese, mildly ill-appearing HEENT: moist mucus membranes, hearing grossly normal  Respiratory system: CTAB diminished by habitus, no wheezes, rales or rhonchi, normal respiratory effort. Cardiovascular system: Irregularly irregular, no JVD, murmurs, rubs, gallops, no pedal edema.   Gastrointestinal system: soft, nontender nondistended. Central nervous system: A&O x4. no gross focal neurologic deficits, normal speech Extremities: moves all, no edema, normal tone Psychiatry: normal mood, congruent affect, judgement and insight appear normal  Labs   Data Reviewed: I have personally reviewed following labs and imaging studies  CBC: Recent Labs  Lab 03/31/21 0856 04/05/21 1738 04/06/21 0625  WBC 3.8* 1.0* 1.1*  1.1*  NEUTROABS 1.5* 0.1* 0.2*  HGB 13.5 13.5 11.5*  11.5*  HCT 39.5 37.8 32.9*  32.3*  MCV 90.2 88.5 91.4  90.2  PLT 149* 160 142*  016*   Basic Metabolic Panel: Recent Labs  Lab 03/31/21 0856 04/05/21 1738 04/06/21 0625  NA 135 136 138  K 3.5 3.8 3.5  CL 100 103 104  CO2 26 25 27   GLUCOSE 117* 117* 113*  BUN 22 17 12   CREATININE 1.07* 0.98 0.88  CALCIUM 8.3* 8.3* 8.0*  MG  --  2.0  --    GFR: Estimated Creatinine Clearance: 81.5 mL/min (by C-G formula based on SCr of 0.88 mg/dL). Liver Function Tests: Recent Labs  Lab 03/31/21 0856 04/05/21 1738  AST 20 20  ALT 20 22  ALKPHOS 69 69  BILITOT 0.9 1.4*  PROT 7.0 6.9  ALBUMIN 3.7 3.7   No results for input(s): LIPASE, AMYLASE in the last 168 hours. No results for input(s):  AMMONIA in the last 168 hours. Coagulation Profile: Recent Labs  Lab 04/06/21 0625  INR 1.3*   Cardiac Enzymes: No results for input(s): CKTOTAL, CKMB, CKMBINDEX, TROPONINI in the last 168 hours. BNP (last 3 results) No results for input(s): PROBNP in the last 8760 hours. HbA1C: No results for input(s): HGBA1C in the last 72 hours. CBG: No results for input(s): GLUCAP in the last 168 hours. Lipid Profile: No results for input(s): CHOL, HDL, LDLCALC, TRIG, CHOLHDL, LDLDIRECT in the last 72 hours. Thyroid Function Tests: No results for input(s): TSH, T4TOTAL, FREET4, T3FREE, THYROIDAB in the last 72 hours. Anemia Panel: No results for input(s): VITAMINB12, FOLATE, FERRITIN, TIBC, IRON, RETICCTPCT in the last 72 hours. Sepsis Labs: Recent Labs  Lab 04/05/21 1738 04/05/21 2028 04/06/21 0625  PROCALCITON  --   --  <0.10  LATICACIDVEN 1.3 1.0  --     Recent Results (from the past 240 hour(s))  Blood culture (routine x 2)     Status: None (Preliminary result)   Collection Time: 04/05/21  5:36 PM   Specimen: BLOOD  Result Value Ref Range Status   Specimen Description BLOOD LEFT ANTECUBITAL  Final   Special Requests   Final    BOTTLES DRAWN AEROBIC AND ANAEROBIC Blood Culture adequate volume   Culture   Final    NO GROWTH < 24 HOURS Performed at [facility], [address] [place], [place] [address]    Report Status PENDING  Incomplete  Resp Panel by RT-PCR (Flu A&B, Covid) Nasopharyngeal Swab     Status: None   Collection Time: 04/05/21  5:38 PM   Specimen: Nasopharyngeal Swab; Nasopharyngeal(NP) swabs in vial transport medium  Result Value Ref Range Status   SARS Coronavirus 2 by RT PCR NEGATIVE NEGATIVE Final    Comment: (NOTE) SARS-CoV-2 target nucleic acids are NOT DETECTED.  The SARS-CoV-2 RNA is generally detectable in upper respiratory specimens during the acute phase of infection. The lowest concentration of SARS-CoV-2 viral copies this assay can  detect is 138 copies/mL. A negative result does not preclude SARS-Cov-2 infection and should not be used as the sole basis for treatment or other patient management decisions. A negative result may occur with  improper specimen collection/handling, submission of specimen other than nasopharyngeal swab, presence of viral mutation(s) within the areas targeted by this assay, and inadequate number of viral copies(<138 copies/mL). A negative result must be combined with clinical observations, patient history, and epidemiological information. The expected result is Negative.  Fact Sheet for Patients:  EntrepreneurPulse.com.au  Fact Sheet for Healthcare Providers:  IncredibleEmployment.be  This test is no t yet approved or cleared by the [place] FDA and  has been authorized for detection and/or diagnosis of SARS-CoV-2 by FDA under an Emergency Use Authorization (EUA). This EUA will remain  in effect (meaning this test can be used) for the duration of the COVID-19 declaration under Section 564(b)(1) of the Act, 21 U.S.C.section 360bbb-3(b)(1), unless the authorization is terminated  or revoked sooner.       Influenza A by PCR NEGATIVE NEGATIVE Final   Influenza B by PCR NEGATIVE NEGATIVE Final    Comment: (NOTE) The Xpert Xpress SARS-CoV-2/FLU/RSV plus assay is intended as an aid in the diagnosis of influenza from Nasopharyngeal swab specimens and should not be used as a sole basis for treatment. Nasal washings and aspirates are unacceptable for Xpert Xpress SARS-CoV-2/FLU/RSV testing.  Fact Sheet for Patients: EntrepreneurPulse.com.au  Fact Sheet for Healthcare Providers: IncredibleEmployment.be  This test is not yet approved or cleared by the [place] FDA and has been authorized for detection and/or diagnosis of SARS-CoV-2 by FDA under an Emergency Use Authorization (EUA). This EUA will remain in  effect (meaning this test can be used) for the duration of the COVID-19 declaration under Section 564(b)(1) of the Act, 21 U.S.C. section 360bbb-3(b)(1), unless the authorization is terminated or revoked.  Performed at [facility], [address] [place], [place] [address]   Blood culture (routine x 2)     Status: None (Preliminary result)   Collection Time: 04/05/21  7:04 PM   Specimen: BLOOD  Result Value Ref Range Status   Specimen Description BLOOD BLOOD LEFT FOREARM  Final   Special Requests   Final    BOTTLES DRAWN AEROBIC AND ANAEROBIC Blood Culture adequate volume   Culture   Final    NO GROWTH < 12 HOURS Performed at [facility], [address], [place], [place] [address]    Report Status PENDING  Incomplete  MRSA Next Gen by PCR, Nasal     Status: None   Collection Time: 04/06/21  4:04 AM   Specimen: Nasal Mucosa; Nasal Swab  Result Value Ref Range Status   MRSA by PCR Next Gen NOT DETECTED NOT DETECTED Final    Comment: (NOTE) The GeneXpert MRSA Assay (FDA approved for NASAL specimens only), is one component of a comprehensive MRSA colonization surveillance program. It is not intended to diagnose MRSA infection nor to guide or monitor treatment for MRSA infections. Test performance is not FDA approved in patients less than 29 years old. Performed at [facility], [address] [place], [place] [address]       Imaging Studies   CT HEAD WO CONTRAST (5MM)  Result Date: 04/05/2021 CLINICAL DATA:  Dizziness nausea vomiting EXAM: CT HEAD WITHOUT CONTRAST TECHNIQUE:  Contiguous axial images were obtained from the base of the skull through the vertex without intravenous contrast. COMPARISON:  CT brain 04/17/2018 FINDINGS: Brain: No evidence of acute infarction, hemorrhage, hydrocephalus, extra-axial collection or mass lesion/mass effect. Vascular: No hyperdense vessels. Mild carotid vascular calcification Skull: Normal. Negative for  fracture or focal lesion. Sinuses/Orbits: No acute finding. Other: None IMPRESSION: Negative non contrasted CT appearance of the brain Electronically Signed   By: Donavan Foil M.D.   On: 04/05/2021 18:33   CT ABDOMEN PELVIS W CONTRAST  Result Date: 04/05/2021 CLINICAL DATA:  C/O nausea, vomiting, nose bleeds since Saturday. Last chemo Wednesday. EXAM: CT ABDOMEN AND PELVIS WITH CONTRAST TECHNIQUE: Multidetector CT imaging of the abdomen and pelvis was performed using the standard protocol following bolus administration of intravenous contrast. CONTRAST:  165mL OMNIPAQUE IOHEXOL 350 MG/ML SOLN COMPARISON:  CT abdomen pelvis 11/05/2019 FINDINGS: Lower chest: No acute abnormality. Hepatobiliary: No focal liver abnormality. No gallstones, gallbladder wall thickening, or pericholecystic fluid. No biliary dilatation. Pancreas: No focal lesion. Normal pancreatic contour. No surrounding inflammatory changes. No main pancreatic ductal dilatation. Spleen: Normal in size without focal abnormality. Adrenals/Urinary Tract: No adrenal nodule bilaterally. Bilateral kidneys enhance symmetrically. There is a 2 cm fluid density lesion within the left kidney that likely represents a simple renal cyst. Subcentimeter hypodensities are too small to characterize. Punctate calcification within the right kidney. No hydronephrosis. No hydroureter. The urinary bladder is unremarkable. On delayed imaging, there is no urothelial wall thickening and there are no filling defects in the opacified portions of the bilateral collecting systems or ureters. Stomach/Bowel: Stomach is within normal limits. No evidence of bowel wall thickening or dilatation. Appendix appears normal. Vascular/Lymphatic: No abdominal aorta or iliac aneurysm. Severe atherosclerotic plaque of the aorta and its branches. No abdominal, pelvic, or inguinal lymphadenopathy. Reproductive: Not visualized due to streak artifact originating from the right femoral surgical  hardware. Other: No intraperitoneal free fluid. No intraperitoneal free gas. No organized fluid collection. Musculoskeletal: No abdominal wall hernia or abnormality. No suspicious lytic or blastic osseous lesions. No acute displaced fracture. Multilevel degenerative changes of the spine. Total right hip arthroplasty. IMPRESSION: 1. No acute intra-abdominal or intrapelvic abnormality. 2.  Aortic Atherosclerosis (ICD10-I70.0). Electronically Signed   By: Iven Finn M.D.   On: 04/05/2021 20:16   DG Chest Portable 1 View  Result Date: 04/05/2021 CLINICAL DATA:  Shortness of breath EXAM: PORTABLE CHEST 1 VIEW COMPARISON:  03/23/2021 FINDINGS: Left-sided central venous port with tip over the SVC. Cardiomegaly with vascular congestion. No consolidation, pleural effusion, or pneumothorax IMPRESSION: Cardiomegaly with vascular congestion Electronically Signed   By: Donavan Foil M.D.   On: 04/05/2021 18:31     Medications   Scheduled Meds:  apixaban  5 mg Oral BID   B-complex with vitamin C   Oral Daily   calcium-vitamin D  1 tablet Oral Q breakfast   cholecalciferol  5,000 Units Oral Daily   DULoxetine  30 mg Oral BID   furosemide  20 mg Oral BID   hydrochlorothiazide  12.5 mg Oral Daily   losartan  100 mg Oral Daily   multivitamin with minerals  1 tablet Oral Daily   pantoprazole  40 mg Oral Daily   potassium chloride SA  20 mEq Oral BID   ranolazine  500 mg Oral BID   rOPINIRole  1 mg Oral QHS   rosuvastatin  20 mg Oral Daily   Tbo-filgastrim (GRANIX) SQ  480 mcg Subcutaneous ONCE-1800   Continuous Infusions:  ceFEPime (MAXIPIME) IV Stopped (04/06/21 1426)   diltiazem (CARDIZEM) infusion 10 mg/hr (04/06/21 1352)   metronidazole Stopped (04/06/21 1127)   vancomycin     vancomycin         LOS: 1 day    Time spent: 30 minutes    Ezekiel Slocumb, DO [facility]  04/06/2021, 5:01 PM      If 7PM-7AM, please contact night-coverage. How to contact the [facility] Attending  or Consulting provider [place] or covering provider during after hours [place], for this patient?    Check the care team in [facility] and look for a) attending/consulting [facility] provider listed and b) the [facility] team listed Log into [email or web address] and use [facility]'s universal password to access. If you do not have the password, please contact the hospital operator. Locate the [facility] provider you are looking for under [facility] and page to a number that you can be directly reached. If you still have difficulty reaching the provider, please page the [facility] (Director on Call) for the Hospitalists listed on amion for assistance.

## 2021-04-06 NOTE — ED Notes (Signed)
Pt called for help from toilet in room, stated "my legs just won't let me get up". Staff assist x4 required to get pt from toilet to bed. Pt reports diarrhea and episode of vomiting while on toilet. Will message attending MD to see if prn diarrhea meds are available.

## 2021-04-07 ENCOUNTER — Inpatient Hospital Stay: Payer: Medicare Other | Admitting: Oncology

## 2021-04-07 ENCOUNTER — Inpatient Hospital Stay: Payer: Medicare Other

## 2021-04-07 DIAGNOSIS — R5081 Fever presenting with conditions classified elsewhere: Secondary | ICD-10-CM | POA: Diagnosis not present

## 2021-04-07 DIAGNOSIS — D709 Neutropenia, unspecified: Secondary | ICD-10-CM | POA: Diagnosis not present

## 2021-04-07 DIAGNOSIS — R110 Nausea: Secondary | ICD-10-CM | POA: Diagnosis not present

## 2021-04-07 LAB — CBC WITH DIFFERENTIAL/PLATELET
Abs Immature Granulocytes: 0 10*3/uL (ref 0.00–0.07)
Basophils Absolute: 0 10*3/uL (ref 0.0–0.1)
Basophils Relative: 2 %
Eosinophils Absolute: 0 10*3/uL (ref 0.0–0.5)
Eosinophils Relative: 2 %
HCT: 32.5 % — ABNORMAL LOW (ref 36.0–46.0)
Hemoglobin: 11.2 g/dL — ABNORMAL LOW (ref 12.0–15.0)
Immature Granulocytes: 0 %
Lymphocytes Relative: 63 %
Lymphs Abs: 1.1 10*3/uL (ref 0.7–4.0)
MCH: 31 pg (ref 26.0–34.0)
MCHC: 34.5 g/dL (ref 30.0–36.0)
MCV: 90 fL (ref 80.0–100.0)
Monocytes Absolute: 0.1 10*3/uL (ref 0.1–1.0)
Monocytes Relative: 4 %
Neutro Abs: 0.5 10*3/uL — ABNORMAL LOW (ref 1.7–7.7)
Neutrophils Relative %: 29 %
Platelets: 122 10*3/uL — ABNORMAL LOW (ref 150–400)
RBC: 3.61 MIL/uL — ABNORMAL LOW (ref 3.87–5.11)
RDW: 12.2 % (ref 11.5–15.5)
Smear Review: NORMAL
WBC: 1.7 10*3/uL — ABNORMAL LOW (ref 4.0–10.5)
nRBC: 0 % (ref 0.0–0.2)

## 2021-04-07 LAB — BASIC METABOLIC PANEL
Anion gap: 8 (ref 5–15)
BUN: 10 mg/dL (ref 8–23)
CO2: 25 mmol/L (ref 22–32)
Calcium: 8.3 mg/dL — ABNORMAL LOW (ref 8.9–10.3)
Chloride: 106 mmol/L (ref 98–111)
Creatinine, Ser: 0.76 mg/dL (ref 0.44–1.00)
GFR, Estimated: >60 mL/min (ref >60)
Glucose, Bld: 102 mg/dL — ABNORMAL HIGH (ref 70–99)
Potassium: 3.2 mmol/L — ABNORMAL LOW (ref 3.5–5.1)
Sodium: 139 mmol/L (ref 135–145)

## 2021-04-07 LAB — URINE CULTURE: Culture: NO GROWTH

## 2021-04-07 LAB — MAGNESIUM: Magnesium: 2 mg/dL (ref 1.7–2.4)

## 2021-04-07 MED ORDER — SODIUM CHLORIDE 0.9% FLUSH
10.0000 mL | Freq: Two times a day (BID) | INTRAVENOUS | Status: DC
  Administered 2021-04-07 – 2021-04-09 (×3): 10 mL

## 2021-04-07 MED ORDER — CHLORHEXIDINE GLUCONATE CLOTH 2 % EX PADS
6.0000 | MEDICATED_PAD | Freq: Every day | CUTANEOUS | Status: DC
  Administered 2021-04-07 – 2021-04-09 (×3): 6 via TOPICAL

## 2021-04-07 MED ORDER — SODIUM CHLORIDE 0.9% FLUSH: 10.0000 mL | INTRAVENOUS | Status: DC | PRN

## 2021-04-07 MED ORDER — VANCOMYCIN HCL 1750 MG/350ML IV SOLN
1750.0000 mg | INTRAVENOUS | Status: DC
Start: 2021-04-07 — End: 2021-04-09
  Administered 2021-04-07 – 2021-04-08 (×2): 1750 mg via INTRAVENOUS
  Filled 2021-04-07 (×3): qty 350

## 2021-04-07 NOTE — Progress Notes (Signed)
Hematology/Oncology Progress Note [facility] Telephone:([phone] [phone]  Patient Care Team: [name], FNP as PCP - General (Family Medicine) Minna Merritts, MD as PCP - Cardiology (Cardiology) Charletta Cousin, MD (Inactive) [name], [name], MD as Consulting Physician (Cardiology) Rico Junker, RN as Oncology Nurse Navigator   Name of the patient: Amber Barnes  [id number]  1950/08/19  Date of visit: 04/07/21   INTERVAL HISTORY-  Patient reports feeling better today.  Mouth sore has improved.  She had some breakfast this morning and is currently eating her lunch.     Review of systems- Review of Systems  Constitutional:  Positive for appetite change and fatigue. Negative for chills and fever.  HENT:   Negative for hearing loss and voice change.   Eyes:  Negative for eye problems.  Respiratory:  Negative for chest tightness and cough.   Cardiovascular:  Negative for chest pain.  Gastrointestinal:  Negative for abdominal distention, abdominal pain, blood in stool, nausea and vomiting.  Endocrine: Negative for hot flashes.  Genitourinary:  Negative for difficulty urinating and frequency.   Musculoskeletal:  Negative for arthralgias.  Skin:  Negative for itching and rash.  Neurological:  Negative for extremity weakness.  Hematological:  Negative for adenopathy.  Psychiatric/Behavioral:  Negative for confusion.    Allergies  Allergen Reactions   Prednisone Other (See Comments)    Agitation, hyperactivity, polyphagia   Amiodarone Nausea And Vomiting   Hydrocodone-Acetaminophen Other (See Comments)    Hallucinations and combative   Oxycodone Itching    Can take it with benadryl   Tapentadol Itching   Tramadol Itching    Can take it with benadryl   Adhesive [Tape] Itching and Rash    Prefers paper tape   Latex Itching and Rash    use paper tap    Patient Active Problem List   Diagnosis Date Noted    Intractable vomiting    Diarrhea    Neutropenic fever ([place]) 04/05/2021   Encounter for antineoplastic chemotherapy 03/24/2021   Neuropathy 03/24/2021   Port-A-Cath in place 03/24/2021   HER2-positive carcinoma of breast ([place]) 01/26/2021   Goals of care, counseling/discussion 01/26/2021   Malignant neoplasm of upper-outer quadrant of right female breast ([place]) 01/19/2021   Left arm pain 12/02/2020   Soft tissue mass 12/02/2020   Candidal intertrigo 09/09/2020   Urinary frequency 09/09/2020   Night sweat 03/31/2020   Nasal drainage 12/18/2019   Sinus congestion 12/18/2019   Nausea 10/30/2019   Major depression in remission ([place]) 08/27/2018   Primary osteoarthritis involving multiple joints 08/27/2018   Atrial fibrillation ([place]) 08/27/2018   COPD (chronic obstructive pulmonary disease) ([place]) 08/27/2018   BMI 50.0-59.9, adult ([place]) 07/16/2018   Morbid obesity with BMI of 50.0-59.9, adult ([place]) 05/31/2018   Bradycardia 05/22/2017   Restless leg syndrome 04/13/2017   OSA (obstructive sleep apnea) 01/09/2017   Fatty liver disease, nonalcoholic 42/59/5638   Family history of ovarian cancer 06/08/2016   GERD (gastroesophageal reflux disease) 05/05/2016   Hypertension 11/11/2014   Anxiety and depression 11/11/2014   Weight loss 11/11/2014     Past Medical History:  Diagnosis Date   Achilles tendinitis    Acute medial meniscal tear    Allergy    Anxiety    Atrial fibrillation ([place])    a. new onset 07/2016; b. CHADS2VASc --> 4 (HTN, female, age, CHF); c. eliquis   CHF (congestive heart failure) (HCC)    Chronic anticoagulation    Apixaban  Chronic lower back pain    Colon polyp    Complication of anesthesia    Emergence delirium; hypoxia   COPD (chronic obstructive pulmonary disease) (HCC)    Cystic breast    Depression    Endometriosis    Fibrocystic breast disease    GERD (gastroesophageal reflux disease)    History of stress test    a. 07/2016 MV: EF 55-65%. Small, mild  defect  in mid anteroseptal, apical septal, and apical locations. No ischemia-->low risk.   Hyperlipidemia    Hypertension    Insomnia    Lipoma    Malignant neoplasm of upper-outer quadrant of right female breast (HCC)    Stage 1A invasive mammary carcinoma (cT1b or T1c N0); ER (-), PR (+), HER2/neu (+)   Migraine    Mild Mitral regurgitation    a. 07/2016 Echo: EF 55-65%, no rwma, mild MR. Mildly dil LA. Nl RV fx.    OSA (obstructive sleep apnea)    non-compliant with nocturnal PAP therapy   Osteoarthritis    left knee   Osteopenia    Peptic ulcer disease    a. H. pylori   Pneumonia    RLS (restless legs syndrome)    Tendonitis    Thyroid disease    Vitamin D deficiency      Past Surgical History:  Procedure Laterality Date   ABDOMINAL HYSTERECTOMY     ovaries left   ACHILLES TENDON SURGERY     2012,01/2017   BACK SURGERY     fusion lumbar   BREAST BIOPSY Right 01/15/2021   Affirm biopsy-"X" clip-INVASIVE MAMMARY CARCINOMA   BREAST BIOPSY Right 01/15/2021   u/s bx-"venus" clip INVASIVE MAMMARY CARCINOMA   BREAST BIOPSY     BREAST LUMPECTOMY,RADIO FREQ LOCALIZER,AXILLARY SENTINEL LYMPH NODE BIOPSY Right 02/17/2021   Procedure: BREAST LUMPECTOMY,RADIO FREQ LOCALIZER,AXILLARY SENTINEL LYMPH NODE BIOPSY;  Surgeon: Ronny Bacon, MD;  Location: [facility];  Service: General;  Laterality: Right;   CARDIOVERSION N/A 09/07/2016   Procedure: CARDIOVERSION;  Surgeon: Minna Merritts, MD;  Location: [facility];  Service: Cardiovascular;  Laterality: N/A;   CARDIOVERSION N/A 10/19/2016   Procedure: CARDIOVERSION;  Surgeon: Minna Merritts, MD;  Location: [facility];  Service: Cardiovascular;  Laterality: N/A;   CARDIOVERSION N/A 12/14/2016   Procedure: CARDIOVERSION;  Surgeon: Minna Merritts, MD;  Location: [facility];  Service: Cardiovascular;  Laterality: N/A;   COLONOSCOPY     COLONOSCOPY WITH PROPOFOL N/A 05/13/2020   Procedure: COLONOSCOPY WITH PROPOFOL;  Surgeon: Toledo,  Benay Pike, MD;  Location: [facility];  Service: Gastroenterology;  Laterality: N/A;  ELIQUIS   ESOPHAGOGASTRODUODENOSCOPY     ESOPHAGOGASTRODUODENOSCOPY (EGD) WITH PROPOFOL N/A 05/13/2020   Procedure: ESOPHAGOGASTRODUODENOSCOPY (EGD) WITH PROPOFOL;  Surgeon: Toledo, Benay Pike, MD;  Location: [facility];  Service: Gastroenterology;  Laterality: N/A;   FOOT SURGERY Left    tendon   JOINT REPLACEMENT Right    hip   PORTA CATH INSERTION N/A 03/23/2021   Procedure: PORTA CATH INSERTION;  Surgeon: Katha Cabal, MD;  Location: [facility];  Service: Cardiovascular;  Laterality: N/A;   PORTACATH PLACEMENT N/A 03/23/2021   Procedure: ATTEMPTED INSERTION PORT-A-CATH;  Surgeon: Ronny Bacon, MD;  Location: [facility];  Service: General;  Laterality: N/A;   THYROIDECTOMY     thyroid lobectomy secondary to a benign nodule   TOTAL HIP ARTHROPLASTY Right 08/21/2018   Procedure: TOTAL HIP ARTHROPLASTY ANTERIOR APPROACH-RIGHT CELL SAVER REQUESTED;  Surgeon: [name], MD;  Location: [facility];  Service: Orthopedics;  Laterality: Right;    Social History   Socioeconomic History   Marital status: Married    Spouse name: jeffrey   Number of children: 1   Years of education: Not on file   Highest education level: High school graduate  Occupational History    Comment: disabled  Tobacco Use   Smoking status: Former    Packs/day: 0.25    Years: 10.00    Pack years: 2.50    Types: Cigarettes    Quit date: 2005    Years since quitting: 17.7   Smokeless tobacco: Never   Tobacco comments:    smoked for 10 years, 1 pack every 2-3 days  Vaping Use   Vaping Use: Never used  Substance and Sexual Activity   Alcohol use: Not Currently    Alcohol/week: 1.0 standard drink    Types: 1 Glasses of wine per week    Comment: rarely   Drug use: No   Sexual activity: Not Currently  Other Topics Concern   Not on file  Social History Narrative   Moved to [place] from [place],  originally from [place].    1 cup of caffeine daily   Right handed          Social Determinants of Health   Financial Resource Strain: Low Risk    Difficulty of Paying Living Expenses: Not very hard  Food Insecurity: No Food Insecurity   Worried About Charity fundraiser in the Last Year: Never true   Ran Out of Food in the Last Year: Never true  Transportation Needs: No Transportation Needs   Lack of Transportation (Medical): No   Lack of Transportation (Non-Medical): No  Physical Activity: Unknown   Days of Exercise per Week: 0 days   Minutes of Exercise per Session: Not on file  Stress: Stress Concern Present   Feeling of Stress : Rather much  Social Connections: Moderately Isolated   Frequency of Communication with Friends and Family: Never   Frequency of Social Gatherings with Friends and Family: Once a week   Attends Religious Services: More than 4 times per year   Active Member of [employer] or Organizations: No   Attends Music therapist: Never   Marital Status: Married  Human resources officer Violence: Not At Risk   Fear of Current or Ex-Partner: No   Emotionally Abused: No   Physically Abused: No   Sexually Abused: No     Family History  Problem Relation Age of Onset   Cancer Sister 9       ovarian   Depression Sister    Cancer Brother        CLL   Cancer Father 67       colon   Heart disease Father    Heart attack Father 34       died from MI   Hyperlipidemia Father    Hypertension Father    Cancer Mother        leukemia   Breast cancer Paternal Aunt        77's   Migraines Neg Hx      Current Facility-Administered Medications:    acetaminophen (TYLENOL) tablet 650 mg, 650 mg, Oral, Q6H PRN **OR** acetaminophen (TYLENOL) suppository 650 mg, 650 mg, Rectal, Q6H PRN, Mansy, Jan A, MD   albuterol (PROVENTIL) (2.5 MG/3ML) 0.083% nebulizer solution 2.5 mg, 2.5 mg, Inhalation, Q8H PRN, Mansy, Jan A, MD   apixaban (ELIQUIS) tablet 5  mg, 5  mg, Oral, BID, Mansy, Jan A, MD, 5 mg at 04/07/21 0847   azelastine (ASTELIN) 0.1 % nasal spray 2 spray, 2 spray, Each Nare, PRN, Mansy, Arvella Merles, MD   B-complex with vitamin C tablet, , Oral, Daily, Mansy, Jan A, MD, 1 tablet at 04/07/21 0846   busPIRone (BUSPAR) tablet 5 mg, 5 mg, Oral, TID PRN, Mansy, Jan A, MD   calcium-vitamin D (OSCAL WITH D) 500-200 MG-UNIT per tablet 1 tablet, 1 tablet, Oral, Q breakfast, Mansy, Jan A, MD, 1 tablet at 04/07/21 0845   ceFEPIme (MAXIPIME) 2 g in sodium chloride 0.9 % 100 mL IVPB, 2 g, Intravenous, Q8H, Beers, Brandon D, RPH, Last Rate: 200 mL/hr at 04/07/21 0625, 2 g at 04/07/21 4665   Chlorhexidine Gluconate Cloth 2 % PADS 6 each, 6 each, Topical, Daily, Enzo Bi, MD, 6 each at 04/07/21 0847   cholecalciferol (VITAMIN D3) tablet 5,000 Units, 5,000 Units, Oral, Daily, Mansy, Jan A, MD, 5,000 Units at 04/07/21 0845   diltiazem (CARDIZEM) 125 mg in dextrose 5% 125 mL (1 mg/mL) infusion, 5-15 mg/hr, Intravenous, Continuous, Vanessa Daviston, MD, Last Rate: 8 mL/hr at 04/07/21 0408, 8 mg/hr at 04/07/21 0408   DULoxetine (CYMBALTA) DR capsule 30 mg, 30 mg, Oral, BID, Mansy, Jan A, MD, 30 mg at 04/07/21 9935   furosemide (LASIX) tablet 20 mg, 20 mg, Oral, BID, Mansy, Jan A, MD, 20 mg at 04/07/21 0847   LORazepam (ATIVAN) tablet 0.5 mg, 0.5 mg, Oral, Q6H PRN, [name], MD, 0.5 mg at 04/06/21 2108   losartan (COZAAR) tablet 100 mg, 100 mg, Oral, Daily, Mansy, Jan A, MD, 100 mg at 04/07/21 1045   magnesium hydroxide (MILK OF MAGNESIA) suspension 30 mL, 30 mL, Oral, Daily PRN, Mansy, Jan A, MD   metoprolol tartrate (LOPRESSOR) tablet 12.5 mg, 12.5 mg, Oral, BID, Nicole Kindred A, DO, 12.5 mg at 04/07/21 0844   multivitamin with minerals tablet 1 tablet, 1 tablet, Oral, Daily, Mansy, Jan A, MD, 1 tablet at 04/07/21 0844   ondansetron (ZOFRAN) tablet 4 mg, 4 mg, Oral, Q6H PRN **OR** ondansetron (ZOFRAN) injection 4 mg, 4 mg, Intravenous, Q6H PRN, Mansy, Jan A, MD, 4 mg  at 04/06/21 0935   pantoprazole (PROTONIX) EC tablet 40 mg, 40 mg, Oral, Daily, Mansy, Jan A, MD, 40 mg at 04/07/21 0847   potassium chloride SA (KLOR-CON) CR tablet 20 mEq, 20 mEq, Oral, BID, Mansy, Jan A, MD, 20 mEq at 04/07/21 0847   prochlorperazine (COMPAZINE) tablet 10 mg, 10 mg, Oral, Q6H PRN, Mansy, Jan A, MD   promethazine (PHENERGAN) tablet 25 mg, 25 mg, Oral, Q6H PRN, Mansy, Jan A, MD, 25 mg at 04/06/21 1359   ranolazine (RANEXA) 12 hr tablet 500 mg, 500 mg, Oral, BID, Mansy, Jan A, MD, 500 mg at 04/07/21 0847   rOPINIRole (REQUIP) tablet 1 mg, 1 mg, Oral, QHS, Mansy, Jan A, MD, 1 mg at 04/06/21 2105   rosuvastatin (CRESTOR) tablet 20 mg, 20 mg, Oral, Daily, Mansy, Jan A, MD, 20 mg at 04/07/21 0845   sodium chloride flush (NS) 0.9 % injection 10-40 mL, 10-40 mL, Intracatheter, Q12H, Enzo Bi, MD   sodium chloride flush (NS) 0.9 % injection 10-40 mL, 10-40 mL, Intracatheter, PRN, Enzo Bi, MD   SUMAtriptan (IMITREX) tablet 50 mg, 50 mg, Oral, Q2H PRN, Mansy, Jan A, MD   traZODone (DESYREL) tablet 25 mg, 25 mg, Oral, QHS PRN, Mansy, Jan A, MD, 25 mg at 04/06/21 2202   vancomycin (VANCOREADY) IVPB  1750 mg/350 mL, 1,750 mg, Intravenous, Q24H, Sherilyn Banker, [facility]   Physical exam:  Vitals:   04/07/21 0412 04/07/21 0812 04/07/21 1044 04/07/21 1224  BP: 121/81 107/77 117/61 105/76  Pulse: 87 84  93  Resp: 14 18  18   Temp: 98.1 F (36.7 C) 97.6 F (36.4 C)    TempSrc:      SpO2: 100% 97%  99%  Weight:      Height:       Physical Exam     CMP Latest Ref Rng & Units 04/07/2021  Glucose 70 - 99 mg/dL 102(H)  BUN 8 - 23 mg/dL 10  Creatinine 0.44 - 1.00 mg/dL 0.76  Sodium 135 - 145 mmol/L 139  Potassium 3.5 - 5.1 mmol/L 3.2(L)  Chloride 98 - 111 mmol/L 106  CO2 22 - 32 mmol/L 25  Calcium 8.9 - 10.3 mg/dL 8.3(L)  Total Protein 6.5 - 8.1 g/dL -  Total Bilirubin 0.3 - 1.2 mg/dL -  Alkaline Phos 38 - 126 U/L -  AST 15 - 41 U/L -  ALT 0 - 44 U/L -   CBC Latest Ref Rng &  Units 04/07/2021  WBC 4.0 - 10.5 K/uL 1.7(L)  Hemoglobin 12.0 - 15.0 g/dL 11.2(L)  Hematocrit 36.0 - 46.0 % 32.5(L)  Platelets 150 - 400 K/uL 122(L)    RADIOGRAPHIC STUDIES: I have personally reviewed the radiological images as listed and agreed with the findings in the report. CT HEAD WO CONTRAST (5MM)  Result Date: 04/05/2021 CLINICAL DATA:  Dizziness nausea vomiting EXAM: CT HEAD WITHOUT CONTRAST TECHNIQUE: Contiguous axial images were obtained from the base of the skull through the vertex without intravenous contrast. COMPARISON:  CT brain 04/17/2018 FINDINGS: Brain: No evidence of acute infarction, hemorrhage, hydrocephalus, extra-axial collection or mass lesion/mass effect. Vascular: No hyperdense vessels. Mild carotid vascular calcification Skull: Normal. Negative for fracture or focal lesion. Sinuses/Orbits: No acute finding. Other: None IMPRESSION: Negative non contrasted CT appearance of the brain Electronically Signed   By: Donavan Foil M.D.   On: 04/05/2021 18:33   CT ABDOMEN PELVIS W CONTRAST  Result Date: 04/05/2021 CLINICAL DATA:  C/O nausea, vomiting, nose bleeds since Saturday. Last chemo Wednesday. EXAM: CT ABDOMEN AND PELVIS WITH CONTRAST TECHNIQUE: Multidetector CT imaging of the abdomen and pelvis was performed using the standard protocol following bolus administration of intravenous contrast. CONTRAST:  184m OMNIPAQUE IOHEXOL 350 MG/ML SOLN COMPARISON:  CT abdomen pelvis 11/05/2019 FINDINGS: Lower chest: No acute abnormality. Hepatobiliary: No focal liver abnormality. No gallstones, gallbladder wall thickening, or pericholecystic fluid. No biliary dilatation. Pancreas: No focal lesion. Normal pancreatic contour. No surrounding inflammatory changes. No main pancreatic ductal dilatation. Spleen: Normal in size without focal abnormality. Adrenals/Urinary Tract: No adrenal nodule bilaterally. Bilateral kidneys enhance symmetrically. There is a 2 cm fluid density lesion within the  left kidney that likely represents a simple renal cyst. Subcentimeter hypodensities are too small to characterize. Punctate calcification within the right kidney. No hydronephrosis. No hydroureter. The urinary bladder is unremarkable. On delayed imaging, there is no urothelial wall thickening and there are no filling defects in the opacified portions of the bilateral collecting systems or ureters. Stomach/Bowel: Stomach is within normal limits. No evidence of bowel wall thickening or dilatation. Appendix appears normal. Vascular/Lymphatic: No abdominal aorta or iliac aneurysm. Severe atherosclerotic plaque of the aorta and its branches. No abdominal, pelvic, or inguinal lymphadenopathy. Reproductive: Not visualized due to streak artifact originating from the right femoral surgical hardware. Other: No intraperitoneal free fluid.  No intraperitoneal free gas. No organized fluid collection. Musculoskeletal: No abdominal wall hernia or abnormality. No suspicious lytic or blastic osseous lesions. No acute displaced fracture. Multilevel degenerative changes of the spine. Total right hip arthroplasty. IMPRESSION: 1. No acute intra-abdominal or intrapelvic abnormality. 2.  Aortic Atherosclerosis (ICD10-I70.0). Electronically Signed   By: Iven Finn M.D.   On: 04/05/2021 20:16   PERIPHERAL VASCULAR CATHETERIZATION  Result Date: 03/23/2021 See surgical note for result.  DG Chest Portable 1 View  Result Date: 04/05/2021 CLINICAL DATA:  Shortness of breath EXAM: PORTABLE CHEST 1 VIEW COMPARISON:  03/23/2021 FINDINGS: Left-sided central venous port with tip over the SVC. Cardiomegaly with vascular congestion. No consolidation, pleural effusion, or pneumothorax IMPRESSION: Cardiomegaly with vascular congestion Electronically Signed   By: Donavan Foil M.D.   On: 04/05/2021 18:31   DG Chest Port 1 View  Result Date: 03/23/2021 CLINICAL DATA:  Pneumothorax EXAM: PORTABLE CHEST 1 VIEW COMPARISON:  11/05/2019  FINDINGS: Lung volumes are small but are symmetric and are clear. No pneumothorax or pleural effusion. Mild cardiomegaly is stable. Pulmonary vascularity is normal. IMPRESSION: No pneumothorax. Stable cardiomegaly Electronically Signed   By: Fidela Salisbury M.D.   On: 03/23/2021 14:35    Assessment and plan-   Chemotherapy-induced nausea vomiting Continue antiemetics as needed.  Symptoms are better.  Chemotherapy-induced neutropenia fever, patient is afebrile. ANC has improved to 0.5.  I will hold off additional G-CSF. Anticipate that her counts will further improve tomorrow. Blood culture showed no growth in 2 days.  Urine culture is negative.  Continue empiric IV antibiotics for 1 more day.   Breast cancer on adjuvant chemotherapy. Hold chemo this week and next week to allow her recover.  She will follow up with me 2 weeks.   Thank you for allowing me to participate in the care of this patient.   [name], MD, PhD Hematology Oncology [facility] Pager- [phone] 04/07/2021

## 2021-04-07 NOTE — Consult Note (Addendum)
Pharmacy Antibiotic Note  Amber Barnes is a 70 y.o. female admitted on 04/05/2021 with sepsis (unk source); neutropenic fever with associated SIRS.  Pharmacy has been consulted for vancomycin & cefepime dosing.  Plan: Change vancomycin from 1500mg  q24h to 1750 mg q24h (est AUC 492.5 based on SCr rounded to 0.8; Vd 0.5 (BMI >40); IBW) MRSA PCR ordered; f/u and narrow vanc as appropriate Order levels when indicated  Continue cefepime 2g q8h Monitor renal function and adjust dose as clinically indicted Plan for 7 days - will assess clinical status/cultures daily for any needs to change LOT   Temp (24hrs), Avg:98.2 F (36.8 C), Min:98.1 F (36.7 C), Max:98.3 F (36.8 C)  Recent Labs  Lab 03/31/21 0856 04/05/21 1738 04/05/21 2028 04/06/21 0625 04/07/21 0410  WBC 3.8* 1.0*  --  1.1*  1.1* 1.7*  CREATININE 1.07* 0.98  --  0.88 0.76  LATICACIDVEN  --  1.3 1.0  --   --      Estimated Creatinine Clearance: 91.6 mL/min (by C-G formula based on SCr of 0.76 mg/dL).    Allergies  Allergen Reactions   Prednisone Other (See Comments)    Agitation, hyperactivity, polyphagia   Amiodarone Nausea And Vomiting   Hydrocodone-Acetaminophen Other (See Comments)    Hallucinations and combative   Oxycodone Itching    Can take it with benadryl   Tapentadol Itching   Tramadol Itching    Can take it with benadryl   Adhesive [Tape] Itching and Rash    Prefers paper tape   Latex Itching and Rash    use paper tap    Antimicrobials this admission: vancomycin 9/26 >>  Cefepime 9/26 >>  Metronidazole 9/26 >>  Dose adjustments this admission: 9/28 Vanc 1500 mg 24h > 1750 mg q24 h   Microbiology results: 9/26 BCx: NG <24h 9/26 UCx: sent/pending  9/26 MRSA PCR: negative  Thank you for allowing pharmacy to be a part of this patient's care.  Forde Dandy Jalaina Salyers 04/07/2021 7:19 AM

## 2021-04-07 NOTE — Progress Notes (Signed)
PROGRESS NOTE    Amber Barnes   TXM:[id number]  DOB: July 23, 1950  PCP: [name], FNP    DOA: 04/05/2021 LOS: 2    Brief Narrative / Hospital Course to Date:   70 year old female with past medical history of chronic A. fib, recently diagnosed breast cancer currently undergoing chemotherapy, hypertension, CHF, hyperlipidemia, and other comorbidities who presented to the ED on 04/05/2021 for evaluation of fever in addition to nausea vomiting and cramping abdominal pain.  She had some dyspnea and palpitations without chest pain.  Most recent chemo treatment was on Wednesday 9/21.  Patient follows with Dr. Tasia Catchings for oncology.  In the ED, patient was found to be neutropenic.  CT abdomen pelvis was unremarkable.  Chest x-ray with some vascular congestion but otherwise negative.  Treated with empiric broad-spectrum antibiotics and IV antiemetics in the ED in addition to IV fluids.  Admitted to [facility] service for further evaluation and management of neutropenic fever and A. fib with RVR.  Assessment & Plan   Active Problems:   Neutropenic fever (HCC)   Neutropenic fever with associated SIRS  -so far no source of infection to suggest sepsis.   --cont cefepime and vanc for now, per oncology --hold off further G-CSF  chemo-induced Refractory nausea, improved --cont antiemetics PRN  Diarrhea, resolved --C diff and GI path were not collected since no more diarrhea since presentation.  Paroxysmal A. fib with RVR -present on admission, likely triggered by acute illness. Recent echo EF 60 to 65% with grade 1 diastolic dysfunction. Appears not on a rate controlling agent at home --Continue Cardizem drip  --cont Lopressor (new) --cardiology consult tomorrow  Anxiety  -continue home Xanax BuSpar and Cymbalta  GERD -continue PPI  Essential hypertension  -cont lasix, losartan, lopressor --cont dilt gtt  Restless leg syndrome -continue Requip  Hyperlipidemia- continue  Crestor    Patient BMI: Body mass index is 47.69 kg/m.     DVT prophylaxis: QM:GNOIBBC Code Status: Full code  Family Communication:  Status is: inpatient Dispo:   The patient is from: home Anticipated d/c is to: home Anticipated d/c date is: 1-2 days Patient currently is not medically stable to d/c due to: neutropenic fever on IV abx   Subjective 04/07/21    Pt ate breakfast and lunch without issue, but started to have nausea with dinner.  No more diarrhea.   Consults, Procedures, Significant Events   Consultants:  Oncology  Procedures:  None  Antimicrobials:  Anti-infectives (From admission, onward)    Start     Dose/Rate Route Frequency Ordered Stop   04/07/21 1800  vancomycin (VANCOREADY) IVPB 1750 mg/350 mL        1,750 mg 175 mL/hr over 120 Minutes Intravenous Every 24 hours 04/07/21 0719 04/13/21 1759   04/06/21 1600  vancomycin (VANCOREADY) IVPB 1500 mg/300 mL  Status:  Discontinued        1,500 mg 150 mL/hr over 120 Minutes Intravenous Every 24 hours 04/05/21 2156 04/07/21 0719   04/06/21 0300  ceFEPIme (MAXIPIME) 2 g in sodium chloride 0.9 % 100 mL IVPB        2 g 200 mL/hr over 30 Minutes Intravenous Every 8 hours 04/05/21 2158     04/05/21 2315  vancomycin (VANCOREADY) IVPB 1500 mg/300 mL       See Hyperspace for full Linked Orders Report.   1,500 mg 150 mL/hr over 120 Minutes Intravenous  Once 04/05/21 2145 04/06/21 0623   04/05/21 2200  ceFEPIme (MAXIPIME) 2 g in sodium  chloride 0.9 % 100 mL IVPB  Status:  Discontinued        2 g 200 mL/hr over 30 Minutes Intravenous  Once 04/05/21 2136 04/06/21 0044   04/05/21 2200  metroNIDAZOLE (FLAGYL) IVPB 500 mg        500 mg 100 mL/hr over 60 Minutes Intravenous Every 12 hours 04/05/21 2136 04/07/21 0959   04/05/21 2145  vancomycin (VANCOCIN) IVPB 1000 mg/200 mL premix  Status:  Discontinued        1,000 mg 200 mL/hr over 60 Minutes Intravenous  Once 04/05/21 2136 04/05/21 2145   04/05/21 2145  vancomycin  (VANCOCIN) IVPB 1000 mg/200 mL premix  Status:  Discontinued       See Hyperspace for full Linked Orders Report.   1,000 mg 200 mL/hr over 60 Minutes Intravenous  Once 04/05/21 2145 04/07/21 0719   04/05/21 2000  vancomycin (VANCOREADY) IVPB 1500 mg/300 mL  Status:  Discontinued        1,500 mg 150 mL/hr over 120 Minutes Intravenous  Once 04/05/21 1842 04/05/21 2145   04/05/21 1845  ceFEPIme (MAXIPIME) 2 g in sodium chloride 0.9 % 100 mL IVPB        2 g 200 mL/hr over 30 Minutes Intravenous  Once 04/05/21 1831 04/05/21 2013   04/05/21 1845  metroNIDAZOLE (FLAGYL) IVPB 500 mg        500 mg 100 mL/hr over 60 Minutes Intravenous  Once 04/05/21 1831 04/05/21 2132   04/05/21 1845  vancomycin (VANCOCIN) IVPB 1000 mg/200 mL premix  Status:  Discontinued        1,000 mg 200 mL/hr over 60 Minutes Intravenous  Once 04/05/21 1831 04/05/21 2340         Micro    Objective   Vitals:   04/07/21 1044 04/07/21 1224 04/07/21 1248 04/07/21 1602  BP: 117/61 105/76  (!) 94/51  Pulse:  93  91  Resp:  18  18  Temp:   98 F (36.7 C) 98 F (36.7 C)  TempSrc:      SpO2:  99%  97%  Weight:      Height:        Intake/Output Summary (Last 24 hours) at 04/07/2021 1932 Last data filed at 04/07/2021 1733 Gross per 24 hour  Intake 1432.95 ml  Output 800 ml  Net 632.95 ml   Filed Weights   04/07/21 0046  Weight: 130 kg    Physical Exam:  Constitutional: NAD, AAOx3 HEENT: conjunctivae and lids normal, EOMI CV: No cyanosis.   RESP: normal respiratory effort, on RA Extremities: No effusions, edema in BLE SKIN: warm, dry Neuro: II - XII grossly intact.   Psych: Normal mood and affect.  Appropriate judgement and reason   Labs   Data Reviewed: I have personally reviewed following labs and imaging studies  CBC: Recent Labs  Lab 04/05/21 1738 04/06/21 0625 04/07/21 0410  WBC 1.0* 1.1*  1.1* 1.7*  NEUTROABS 0.1* 0.2* 0.5*  HGB 13.5 11.5*  11.5* 11.2*  HCT 37.8 32.9*  32.3* 32.5*   MCV 88.5 91.4  90.2 90.0  PLT 160 142*  131* 355*   Basic Metabolic Panel: Recent Labs  Lab 04/05/21 1738 04/06/21 0625 04/07/21 0410  NA 136 138 139  K 3.8 3.5 3.2*  CL 103 104 106  CO2 25 27 25   GLUCOSE 117* 113* 102*  BUN 17 12 10   CREATININE 0.98 0.88 0.76  CALCIUM 8.3* 8.0* 8.3*  MG 2.0  --  2.0  GFR: Estimated Creatinine Clearance: 91.6 mL/min (by C-G formula based on SCr of 0.76 mg/dL). Liver Function Tests: Recent Labs  Lab 04/05/21 1738  AST 20  ALT 22  ALKPHOS 69  BILITOT 1.4*  PROT 6.9  ALBUMIN 3.7   No results for input(s): LIPASE, AMYLASE in the last 168 hours. No results for input(s): AMMONIA in the last 168 hours. Coagulation Profile: Recent Labs  Lab 04/06/21 0625  INR 1.3*   Cardiac Enzymes: No results for input(s): CKTOTAL, CKMB, CKMBINDEX, TROPONINI in the last 168 hours. BNP (last 3 results) No results for input(s): PROBNP in the last 8760 hours. HbA1C: No results for input(s): HGBA1C in the last 72 hours. CBG: No results for input(s): GLUCAP in the last 168 hours. Lipid Profile: No results for input(s): CHOL, HDL, LDLCALC, TRIG, CHOLHDL, LDLDIRECT in the last 72 hours. Thyroid Function Tests: No results for input(s): TSH, T4TOTAL, FREET4, T3FREE, THYROIDAB in the last 72 hours. Anemia Panel: No results for input(s): VITAMINB12, FOLATE, FERRITIN, TIBC, IRON, RETICCTPCT in the last 72 hours. Sepsis Labs: Recent Labs  Lab 04/05/21 1738 04/05/21 2028 04/06/21 0625  PROCALCITON  --   --  <0.10  LATICACIDVEN 1.3 1.0  --     Recent Results (from the past 240 hour(s))  Blood culture (routine x 2)     Status: None (Preliminary result)   Collection Time: 04/05/21  5:36 PM   Specimen: BLOOD  Result Value Ref Range Status   Specimen Description BLOOD LEFT ANTECUBITAL  Final   Special Requests   Final    BOTTLES DRAWN AEROBIC AND ANAEROBIC Blood Culture adequate volume   Culture   Final    NO GROWTH 2 DAYS Performed at [facility], [address] [place], [place] [address]    Report Status PENDING  Incomplete  Resp Panel by RT-PCR (Flu A&B, Covid) Nasopharyngeal Swab     Status: None   Collection Time: 04/05/21  5:38 PM   Specimen: Nasopharyngeal Swab; Nasopharyngeal(NP) swabs in vial transport medium  Result Value Ref Range Status   SARS Coronavirus 2 by RT PCR NEGATIVE NEGATIVE Final    Comment: (NOTE) SARS-CoV-2 target nucleic acids are NOT DETECTED.  The SARS-CoV-2 RNA is generally detectable in upper respiratory specimens during the acute phase of infection. The lowest concentration of SARS-CoV-2 viral copies this assay can detect is 138 copies/mL. A negative result does not preclude SARS-Cov-2 infection and should not be used as the sole basis for treatment or other patient management decisions. A negative result may occur with  improper specimen collection/handling, submission of specimen other than nasopharyngeal swab, presence of viral mutation(s) within the areas targeted by this assay, and inadequate number of viral copies(<138 copies/mL). A negative result must be combined with clinical observations, patient history, and epidemiological information. The expected result is Negative.  Fact Sheet for Patients:  EntrepreneurPulse.com.au  Fact Sheet for Healthcare Providers:  IncredibleEmployment.be  This test is no t yet approved or cleared by the [place] FDA and  has been authorized for detection and/or diagnosis of SARS-CoV-2 by FDA under an Emergency Use Authorization (EUA). This EUA will remain  in effect (meaning this test can be used) for the duration of the COVID-19 declaration under Section 564(b)(1) of the Act, 21 U.S.C.section 360bbb-3(b)(1), unless the authorization is terminated  or revoked sooner.       Influenza A by PCR NEGATIVE NEGATIVE Final   Influenza B by PCR NEGATIVE NEGATIVE Final    Comment: (NOTE) The  Xpert Xpress  SARS-CoV-2/FLU/RSV plus assay is intended as an aid in the diagnosis of influenza from Nasopharyngeal swab specimens and should not be used as a sole basis for treatment. Nasal washings and aspirates are unacceptable for Xpert Xpress SARS-CoV-2/FLU/RSV testing.  Fact Sheet for Patients: EntrepreneurPulse.com.au  Fact Sheet for Healthcare Providers: IncredibleEmployment.be  This test is not yet approved or cleared by the [place] FDA and has been authorized for detection and/or diagnosis of SARS-CoV-2 by FDA under an Emergency Use Authorization (EUA). This EUA will remain in effect (meaning this test can be used) for the duration of the COVID-19 declaration under Section 564(b)(1) of the Act, 21 U.S.C. section 360bbb-3(b)(1), unless the authorization is terminated or revoked.  Performed at [facility], [address] [place], [place] [address]   Blood culture (routine x 2)     Status: None (Preliminary result)   Collection Time: 04/05/21  7:04 PM   Specimen: BLOOD  Result Value Ref Range Status   Specimen Description BLOOD BLOOD LEFT FOREARM  Final   Special Requests   Final    BOTTLES DRAWN AEROBIC AND ANAEROBIC Blood Culture adequate volume   Culture   Final    NO GROWTH 2 DAYS Performed at [facility], [address] [place], [place] [address]    Report Status PENDING  Incomplete  Urine Culture     Status: None   Collection Time: 04/06/21  4:04 AM   Specimen: Urine, Random  Result Value Ref Range Status   Specimen Description   Final    URINE, RANDOM Performed at [facility], [address] [place], [place] [address]    Special Requests   Final    NONE Performed at [facility], [address] [place], [place] [address]    Culture   Final    NO GROWTH Performed at [facility], [place] [address], [place], [place] [address]    Report Status 04/07/2021 FINAL  Final  MRSA Next  Gen by PCR, Nasal     Status: None   Collection Time: 04/06/21  4:04 AM   Specimen: Nasal Mucosa; Nasal Swab  Result Value Ref Range Status   MRSA by PCR Next Gen NOT DETECTED NOT DETECTED Final    Comment: (NOTE) The GeneXpert MRSA Assay (FDA approved for NASAL specimens only), is one component of a comprehensive MRSA colonization surveillance program. It is not intended to diagnose MRSA infection nor to guide or monitor treatment for MRSA infections. Test performance is not FDA approved in patients less than 64 years old. Performed at [facility], [address], [place], [place] [address]       Imaging Studies   CT ABDOMEN PELVIS W CONTRAST  Result Date: 04/05/2021 CLINICAL DATA:  C/O nausea, vomiting, nose bleeds since Saturday. Last chemo Wednesday. EXAM: CT ABDOMEN AND PELVIS WITH CONTRAST TECHNIQUE: Multidetector CT imaging of the abdomen and pelvis was performed using the standard protocol following bolus administration of intravenous contrast. CONTRAST:  19mL OMNIPAQUE IOHEXOL 350 MG/ML SOLN COMPARISON:  CT abdomen pelvis 11/05/2019 FINDINGS: Lower chest: No acute abnormality. Hepatobiliary: No focal liver abnormality. No gallstones, gallbladder wall thickening, or pericholecystic fluid. No biliary dilatation. Pancreas: No focal lesion. Normal pancreatic contour. No surrounding inflammatory changes. No main pancreatic ductal dilatation. Spleen: Normal in size without focal abnormality. Adrenals/Urinary Tract: No adrenal nodule bilaterally. Bilateral kidneys enhance symmetrically. There is a 2 cm fluid density lesion within the left kidney that likely represents a  simple renal cyst. Subcentimeter hypodensities are too small to characterize. Punctate calcification within the right kidney. No hydronephrosis. No hydroureter. The urinary bladder is unremarkable. On delayed imaging, there is no urothelial wall thickening and there are no filling defects in the opacified portions  of the bilateral collecting systems or ureters. Stomach/Bowel: Stomach is within normal limits. No evidence of bowel wall thickening or dilatation. Appendix appears normal. Vascular/Lymphatic: No abdominal aorta or iliac aneurysm. Severe atherosclerotic plaque of the aorta and its branches. No abdominal, pelvic, or inguinal lymphadenopathy. Reproductive: Not visualized due to streak artifact originating from the right femoral surgical hardware. Other: No intraperitoneal free fluid. No intraperitoneal free gas. No organized fluid collection. Musculoskeletal: No abdominal wall hernia or abnormality. No suspicious lytic or blastic osseous lesions. No acute displaced fracture. Multilevel degenerative changes of the spine. Total right hip arthroplasty. IMPRESSION: 1. No acute intra-abdominal or intrapelvic abnormality. 2.  Aortic Atherosclerosis (ICD10-I70.0). Electronically Signed   By: Iven Finn M.D.   On: 04/05/2021 20:16     Medications   Scheduled Meds:  apixaban  5 mg Oral BID   B-complex with vitamin C   Oral Daily   calcium-vitamin D  1 tablet Oral Q breakfast   Chlorhexidine Gluconate Cloth  6 each Topical Daily   cholecalciferol  5,000 Units Oral Daily   DULoxetine  30 mg Oral BID   furosemide  20 mg Oral BID   losartan  100 mg Oral Daily   metoprolol tartrate  12.5 mg Oral BID   multivitamin with minerals  1 tablet Oral Daily   pantoprazole  40 mg Oral Daily   potassium chloride SA  20 mEq Oral BID   ranolazine  500 mg Oral BID   rOPINIRole  1 mg Oral QHS   rosuvastatin  20 mg Oral Daily   sodium chloride flush  10-40 mL Intracatheter Q12H   Continuous Infusions:  ceFEPime (MAXIPIME) IV 2 g (04/07/21 1436)   diltiazem (CARDIZEM) infusion 8 mg/hr (04/07/21 0408)   vancomycin 1,750 mg (04/07/21 1717)       LOS: 2 days    Enzo Bi, md [facility]  04/07/2021, 7:32 PM

## 2021-04-08 ENCOUNTER — Other Ambulatory Visit: Payer: Self-pay

## 2021-04-08 DIAGNOSIS — I4819 Other persistent atrial fibrillation: Secondary | ICD-10-CM

## 2021-04-08 DIAGNOSIS — I5030 Unspecified diastolic (congestive) heart failure: Secondary | ICD-10-CM

## 2021-04-08 DIAGNOSIS — A419 Sepsis, unspecified organism: Secondary | ICD-10-CM | POA: Diagnosis not present

## 2021-04-08 DIAGNOSIS — I10 Essential (primary) hypertension: Secondary | ICD-10-CM | POA: Diagnosis not present

## 2021-04-08 DIAGNOSIS — R5081 Fever presenting with conditions classified elsewhere: Secondary | ICD-10-CM | POA: Diagnosis not present

## 2021-04-08 DIAGNOSIS — D709 Neutropenia, unspecified: Secondary | ICD-10-CM | POA: Diagnosis not present

## 2021-04-08 DIAGNOSIS — C50919 Malignant neoplasm of unspecified site of unspecified female breast: Secondary | ICD-10-CM

## 2021-04-08 LAB — CBC WITH DIFFERENTIAL/PLATELET
Abs Immature Granulocytes: 0.18 10*3/uL — ABNORMAL HIGH (ref 0.00–0.07)
Basophils Absolute: 0.1 10*3/uL (ref 0.0–0.1)
Basophils Relative: 2 %
Eosinophils Absolute: 0 10*3/uL (ref 0.0–0.5)
Eosinophils Relative: 1 %
HCT: 29.1 % — ABNORMAL LOW (ref 36.0–46.0)
Hemoglobin: 10.2 g/dL — ABNORMAL LOW (ref 12.0–15.0)
Immature Granulocytes: 8 %
Lymphocytes Relative: 53 %
Lymphs Abs: 1.2 10*3/uL (ref 0.7–4.0)
MCH: 31.6 pg (ref 26.0–34.0)
MCHC: 35.1 g/dL (ref 30.0–36.0)
MCV: 90.1 fL (ref 80.0–100.0)
Monocytes Absolute: 0.3 10*3/uL (ref 0.1–1.0)
Monocytes Relative: 11 %
Neutro Abs: 0.6 10*3/uL — ABNORMAL LOW (ref 1.7–7.7)
Neutrophils Relative %: 25 %
Platelets: 121 10*3/uL — ABNORMAL LOW (ref 150–400)
RBC: 3.23 MIL/uL — ABNORMAL LOW (ref 3.87–5.11)
RDW: 12.3 % (ref 11.5–15.5)
Smear Review: NORMAL
WBC: 2.3 10*3/uL — ABNORMAL LOW (ref 4.0–10.5)
nRBC: 0.9 % — ABNORMAL HIGH (ref 0.0–0.2)

## 2021-04-08 LAB — MAGNESIUM: Magnesium: 1.8 mg/dL (ref 1.7–2.4)

## 2021-04-08 LAB — BASIC METABOLIC PANEL
Anion gap: 6 (ref 5–15)
BUN: 7 mg/dL — ABNORMAL LOW (ref 8–23)
CO2: 25 mmol/L (ref 22–32)
Calcium: 7.9 mg/dL — ABNORMAL LOW (ref 8.9–10.3)
Chloride: 106 mmol/L (ref 98–111)
Creatinine, Ser: 0.7 mg/dL (ref 0.44–1.00)
GFR, Estimated: >60 mL/min (ref >60)
Glucose, Bld: 94 mg/dL (ref 70–99)
Potassium: 3.6 mmol/L (ref 3.5–5.1)
Sodium: 137 mmol/L (ref 135–145)

## 2021-04-08 MED ORDER — TBO-FILGRASTIM 300 MCG/0.5ML ~~LOC~~ SOSY
480.0000 ug | PREFILLED_SYRINGE | Freq: Once | SUBCUTANEOUS | Status: AC
  Administered 2021-04-08: 480 ug via SUBCUTANEOUS
  Filled 2021-04-08: qty 1

## 2021-04-08 MED ORDER — BISOPROLOL FUMARATE 5 MG PO TABS
5.0000 mg | ORAL_TABLET | Freq: Two times a day (BID) | ORAL | Status: DC
  Administered 2021-04-08: 5 mg via ORAL
  Filled 2021-04-08 (×2): qty 1

## 2021-04-08 MED ORDER — TBO-FILGRASTIM 480 MCG/0.8ML ~~LOC~~ SOSY
480.0000 ug | PREFILLED_SYRINGE | Freq: Once | SUBCUTANEOUS | Status: DC
  Filled 2021-04-08: qty 0.8

## 2021-04-08 NOTE — Consult Note (Signed)
Cardiology Consultation:   Patient ID: Amber Barnes MRN: [id number]; DOB: 1950/08/03  Admit date: 04/05/2021 Date of Consult: 04/08/2021  PCP:  [name], [facility]  Cardiologist:  Ida Rogue, MD  Advanced Practice Provider:  No care team member to display Electrophysiologist:  None    Patient Profile:   Amber Barnes is a 70 y.o. female with a hx of history of PAF, obesity, OA, hyperlipidemia, hypertension, OSA on CPAP, migraines, H. pylori/peptic ulcer disease, anxiety, and who is being seen today for the evaluation of Afib at the request of Dr. Billie Ruddy.  History of Present Illness:   [name] is a 70 y.o. female with PMH as above.  She was diagnosed with atrial fibrillation 07/2016.  Echo showed normal LV SF, mild MR.  MPI low risk.  DCCV 08/2016.  She was managed on amiodarone, BB, Eliquis.  10/2019, she was seen in the ED for atrial fibrillation with intractable nausea, chest pain, epigastric pain, and abdominal pain.  Amiodarone discontinued, as it was felt to be contributing to the symptoms.  Beta-blocker dose adjusted.  She was later diagnosed with gastritis and H. pylori.  At her visit 05/2020, she is maintaining NSR.  She was hypertensive but home BP well controlled.  She reported a 4-day history of A. fib via MyChart 08/2020.  She was advised to take an additional beta-blocker for breakthrough tachypalpitations.  5/25, she was evaluated for left upper extremity swelling.  Ultrasound negative for DVT.  Concern noted for lymphedema with patient referred to vascular.     She was last seen in the office 12/16/2020 by Murray Hodgkins, NP, at which time it was noted she was stable from a cardiac standpoint.  She denied feeling any episodes of atrial fibrillation.  She reported chronic dyspnea on exertion in the setting of sedentary lifestyle, secondary to chronic back and hip pain.  She reported being able to ambulate around her  home with a cane without dyspnea.  At high levels of activity, she would need to rest more frequently.  She had intermittent nausea.  She had noticed some enlarging of the left bicep/tricep area, with ultrasound negative for DVT as above.   Since that time, she has had frequent visits with oncology for HER2 positive breast cancer.  On 12/30/2020, she had a screening mammogram done, showing possible mass and asymmetry in the right breast.  On 01/08/2021, diagnostic mammogram showed 2 adjacent irregular spiculated masses of the right breast with follow-up biopsy showing invasive carcinoma/invasive mammary carcinoma.  On 02/2021, she underwent lumpectomy and sentinel lymph node biopsy.  Pathology showed invasive mammary carcinoma.  She was diagnosed with right breast cancer. On 03/03/2021, she saw her oncologist with recommendation for adjuvant chemotherapy with weekly Taxol plus trastuzumab for 12 weeks, followed by trastuzumab maintenance every 21 days for 13 cycles.  Recommendation was for patient to follow-up with cardiology prior to chemotherapy.   03/08/2021 echo showed EF 60 to 65%, mild LVH, G1 DD, GLS -16.3%, mild LAE, trivial MR, but was not able to evaluate regional wall motion.   Seen in office last 9/13 for preop evaluation before chemotherapy.  No medication changes.  She reported some earlier episodes of feeling as if she was in atrial fibrillation with rhythm at the time of her visit sinus bradycardia.  She was only taking bisoprolol when she felt like she was having an episode of atrial fibrillation.  She had chemotherapy on Wednesday 9/21 and  notes that she tolerated this fine.  On Friday, she started to feel poorly with significant malaise and fatigue.  She felt as if she was unable to ambulate well.  She felt as if she was back in atrial fibrillation.  On Saturday 9/24, she reports feeling nauseated with episodes of emesis.  She had abdominal pain.  She noted wheezing and shortness of breath.  She  felt as if her shortness of breath was worse due to recurrence of atrial fibrillation.  Shortly thereafter, she noted she felt unable to lay flat.  No swelling.  She denies chest pain but notes episodes of chest heaviness that have been ongoing and mainly associated with shortness of breath.  She had a cough with clear phlegm. No tachypalpitations.  She was unable to keep any food down.  When the symptoms continued, she decided to present to the emergency department, as she was unable to eat.  No reported melena or hematochezia.  She reported compliance with her anticoagulation.  She decided to present to [facility] emergency department 9/26 due to ongoing symptoms.  BP 121/95 with ventricular rate 137 bpm.  Chest x-ray showed cardiomegaly with vascular congestion.  EKG showed atrial fibrillation.  CT performed and without significant findings.  Labs showed neutropenia with recommendation to start broad spectrum antibiotics.  No infection was noted around the port.  She was started on a diltiazem drip and metoprolol for her elevated rates. Today, 9/29, cardiology is consulted for ongoing atrial fibrillation with current ventricular rates well controlled.  Since admission, she reports recent fall on her way to the restroom, though she did not hit her head.  She does have chronic dizziness, as reported in the outpatient setting, and this is usually exacerbated by her atrial fibrillation.  She states her fall occurred when she tried to reach over to the table next to her bed to grab some food.  She also notes ongoing nausea, chest heaviness, shortness of breath, cough with phlegm (and a "tickle" in her throat), fatigue, and orthopnea.  No signs or symptoms of bleeding.  Of note, she did hold her anticoagulation recently for insertion of her chemotherapy port.  She states that she is able to hear herself wheeze, which is new for her, and started shortly after her chemotherapy and worse within the last few days.  Per  patient, she was instructed to use her inhaler more often with initiation of chemotherapy, so she has increased her albuterol use, which we discussed could be increasing her ventricular rates.  On review of oncology note, recommendation is to hold chemotherapy this week and next week to allow for recovery with follow-up in approximately 2 weeks.  Past Medical History:  Diagnosis Date   Achilles tendinitis    Acute medial meniscal tear    Allergy    Anxiety    Atrial fibrillation ([place])    a. new onset 07/2016; b. CHADS2VASc --> 4 (HTN, female, age, CHF); c. eliquis   CHF (congestive heart failure) (HCC)    Chronic anticoagulation    Apixaban   Chronic lower back pain    Colon polyp    Complication of anesthesia    Emergence delirium; hypoxia   COPD (chronic obstructive pulmonary disease) ([place])    Cystic breast    Depression    Endometriosis    Fibrocystic breast disease    GERD (gastroesophageal reflux disease)    History of stress test    a. 07/2016 MV: EF 55-65%. Small, mild defect  in  mid anteroseptal, apical septal, and apical locations. No ischemia-->low risk.   Hyperlipidemia    Hypertension    Insomnia    Lipoma    Malignant neoplasm of upper-outer quadrant of right female breast (HCC)    Stage 1A invasive mammary carcinoma (cT1b or T1c N0); ER (-), PR (+), HER2/neu (+)   Migraine    Mild Mitral regurgitation    a. 07/2016 Echo: EF 55-65%, no rwma, mild MR. Mildly dil LA. Nl RV fx.    OSA (obstructive sleep apnea)    non-compliant with nocturnal PAP therapy   Osteoarthritis    left knee   Osteopenia    Peptic ulcer disease    a. H. pylori   Pneumonia    RLS (restless legs syndrome)    Tendonitis    Thyroid disease    Vitamin D deficiency     Past Surgical History:  Procedure Laterality Date   ABDOMINAL HYSTERECTOMY     ovaries left   ACHILLES TENDON SURGERY     2012,01/2017   BACK SURGERY     fusion lumbar   BREAST BIOPSY Right 01/15/2021   Affirm biopsy-"X"  clip-INVASIVE MAMMARY CARCINOMA   BREAST BIOPSY Right 01/15/2021   u/s bx-"venus" clip INVASIVE MAMMARY CARCINOMA   BREAST BIOPSY     BREAST LUMPECTOMY,RADIO FREQ LOCALIZER,AXILLARY SENTINEL LYMPH NODE BIOPSY Right 02/17/2021   Procedure: BREAST LUMPECTOMY,RADIO FREQ LOCALIZER,AXILLARY SENTINEL LYMPH NODE BIOPSY;  Surgeon: Ronny Bacon, MD;  Location: [facility];  Service: General;  Laterality: Right;   CARDIOVERSION N/A 09/07/2016   Procedure: CARDIOVERSION;  Surgeon: Minna Merritts, MD;  Location: [facility];  Service: Cardiovascular;  Laterality: N/A;   CARDIOVERSION N/A 10/19/2016   Procedure: CARDIOVERSION;  Surgeon: Minna Merritts, MD;  Location: [facility];  Service: Cardiovascular;  Laterality: N/A;   CARDIOVERSION N/A 12/14/2016   Procedure: CARDIOVERSION;  Surgeon: Minna Merritts, MD;  Location: [facility];  Service: Cardiovascular;  Laterality: N/A;   COLONOSCOPY     COLONOSCOPY WITH PROPOFOL N/A 05/13/2020   Procedure: COLONOSCOPY WITH PROPOFOL;  Surgeon: Toledo, Benay Pike, MD;  Location: [facility];  Service: Gastroenterology;  Laterality: N/A;  ELIQUIS   ESOPHAGOGASTRODUODENOSCOPY     ESOPHAGOGASTRODUODENOSCOPY (EGD) WITH PROPOFOL N/A 05/13/2020   Procedure: ESOPHAGOGASTRODUODENOSCOPY (EGD) WITH PROPOFOL;  Surgeon: Toledo, Benay Pike, MD;  Location: [facility];  Service: Gastroenterology;  Laterality: N/A;   FOOT SURGERY Left    tendon   JOINT REPLACEMENT Right    hip   PORTA CATH INSERTION N/A 03/23/2021   Procedure: PORTA CATH INSERTION;  Surgeon: Katha Cabal, MD;  Location: [facility];  Service: Cardiovascular;  Laterality: N/A;   PORTACATH PLACEMENT N/A 03/23/2021   Procedure: ATTEMPTED INSERTION PORT-A-CATH;  Surgeon: Ronny Bacon, MD;  Location: [facility];  Service: General;  Laterality: N/A;   THYROIDECTOMY     thyroid lobectomy secondary to a benign nodule   TOTAL HIP ARTHROPLASTY Right 08/21/2018   Procedure: TOTAL HIP ARTHROPLASTY  ANTERIOR APPROACH-RIGHT CELL SAVER REQUESTED;  Surgeon: [name], MD;  Location: [facility];  Service: Orthopedics;  Laterality: Right;     Home Medications:  Prior to Admission medications   Medication Sig Start Date End Date Taking? Authorizing Provider  B Complex Vitamins (VITAMIN B COMPLEX PO) Take 1 tablet by mouth daily.    Yes [provider]  bisoprolol (ZEBETA) 5 MG tablet TAKE 1 TABLET BY MOUTH IN THE MORNING AND MAY TAKE AN EXTRA IN THE EVENING IF NEEDED FOR HEART RATE  03/10/21  Yes Gollan, [name], MD  busPIRone (BUSPAR) 5 MG tablet TAKE 1 TABLET BY MOUTH THREE TIMES A DAY AS NEEDED 03/02/21  Yes [name], FNP  calcium gluconate 500 MG tablet Take 500 tablets by mouth daily.   Yes [provider]  calcium-vitamin D (OSCAL WITH D) 500-200 MG-UNIT tablet Take 1 tablet by mouth daily with breakfast.   Yes [provider]  Cholecalciferol (VITAMIN D3) 5000 units CAPS Take 5,000 Units by mouth daily.   Yes [provider]  DULoxetine (CYMBALTA) 30 MG capsule Take two tablets by mouth for a total of 60 MG daily. Patient taking differently: Take 30 mg by mouth 2 (two) times daily. 01/28/21  Yes Arnett, Yvetta Coder, FNP  ELIQUIS 5 MG TABS tablet TAKE 1 TABLET BY MOUTH TWICE A DAY 11/25/20  Yes Gollan, [name], MD  esomeprazole (NEXIUM) 20 MG capsule Take 20 mg by mouth as needed.   Yes [provider]  furosemide (LASIX) 20 MG tablet Take 1 tablet (20 mg total) by mouth 2 (two) times daily. 03/25/21  Yes Gollan, [name], MD  hydrochlorothiazide (MICROZIDE) 12.5 MG capsule TAKE 1 CAPSULE BY MOUTH EVERY DAY 03/25/21  Yes Gollan, [name], MD  LORazepam (ATIVAN) 0.5 MG tablet Take 1 tablet (0.5 mg total) by mouth every 8 (eight) hours as needed for anxiety (nausea/ vomiting). 03/31/21  Yes [name], MD  losartan (COZAAR) 100 MG tablet TAKE 1 TABLET BY MOUTH DAILY. PLEASE CALL OFFICE TO SCHEDULE AN APPOINTMENT FOR FURTHER REFILLS 02/15/21  Yes  Minna Merritts, MD  Multiple Vitamin (MULTIVITAMIN) tablet Take 1 tablet by mouth daily.   Yes [provider]  potassium chloride SA (KLOR-CON) 20 MEQ tablet Take 1 tablet (20 mEq total) by mouth 2 (two) times daily. 03/03/21  Yes [name], FNP  ranolazine (RANEXA) 500 MG 12 hr tablet TAKE 1 TABLET BY MOUTH TWICE A DAY 03/25/21  Yes Gollan, [name], MD  rosuvastatin (CRESTOR) 20 MG tablet TAKE 1 TABLET BY MOUTH EVERY DAY 03/25/21  Yes [name], FNP  acetaminophen (TYLENOL) 500 MG tablet Take 500 mg by mouth every 6 (six) hours as needed. Taking as needed    [provider]  albuterol (VENTOLIN HFA) 108 (90 Base) MCG/ACT inhaler INHALE 2 PUFFS INTO THE LUNGS EVERY 8 (EIGHT) HOURS AS NEEDED FOR WHEEZING OR SHORTNESS OF BREATH. 04/04/19   [name], FNP  ALPRAZolam (XANAX) 0.25 MG tablet TAKE 1 TABLET BY MOUTH DAILY AS NEEDED FOR ANXIETY OR SLEEP. Patient not taking: Reported on 04/06/2021 01/29/21   [name], FNP  azelastine (ASTELIN) 0.1 % nasal spray Place into both nostrils as needed for rhinitis. Use in each nostril as directed    [provider]  lidocaine-prilocaine (EMLA) cream Apply to affected area once 03/19/21   [name], MD  ondansetron (ZOFRAN) 8 MG tablet Take 1 tablet (8 mg total) by mouth every 8 (eight) hours as needed for nausea or vomiting. 03/31/21   [name], MD  prochlorperazine (COMPAZINE) 10 MG tablet Take 1 tablet (10 mg total) by mouth every 6 (six) hours as needed (Nausea or vomiting). 03/19/21   [name], MD  promethazine (PHENERGAN) 25 MG tablet Take 1 tablet (25 mg total) by mouth every 6 (six) hours as needed for nausea or vomiting. 04/02/21   [name], MD  rizatriptan (MAXALT) 10 MG tablet TAKE 1 TABLET (10 MG TOTAL) BY MOUTH AS NEEDED FOR MIGRAINE.  MAY REPEAT IN 2 HOURS IF NEEDED 03/13/18   Melvenia Beam, MD  rOPINIRole (REQUIP) 1 MG tablet TAKE 1 TABLET BY MOUTH AT BEDTIME. 01/28/21   [name], FNP     Inpatient Medications: Scheduled Meds:  apixaban  5 mg Oral BID   B-complex with vitamin C   Oral Daily   bisoprolol  5 mg Oral BID   calcium-vitamin D  1 tablet Oral Q breakfast   Chlorhexidine Gluconate Cloth  6 each Topical Daily   cholecalciferol  5,000 Units Oral Daily   DULoxetine  30 mg Oral BID   furosemide  20 mg Oral BID   multivitamin with minerals  1 tablet Oral Daily   pantoprazole  40 mg Oral Daily   potassium chloride SA  20 mEq Oral BID   ranolazine  500 mg Oral BID   rOPINIRole  1 mg Oral QHS   rosuvastatin  20 mg Oral Daily   sodium chloride flush  10-40 mL Intracatheter Q12H   Tbo-filgastrim (GRANIX) SQ  480 mcg Subcutaneous ONCE-1800   Continuous Infusions:  ceFEPime (MAXIPIME) IV 2 g (04/08/21 0532)   diltiazem (CARDIZEM) infusion 8 mg/hr (04/08/21 1312)   vancomycin 1,750 mg (04/07/21 1717)   PRN Meds: acetaminophen **OR** acetaminophen, albuterol, azelastine, busPIRone, LORazepam, magnesium hydroxide, ondansetron **OR** ondansetron (ZOFRAN) IV, prochlorperazine, promethazine, sodium chloride flush, SUMAtriptan, traZODone  Allergies:    Allergies  Allergen Reactions   Prednisone Other (See Comments)    Agitation, hyperactivity, polyphagia   Amiodarone Nausea And Vomiting   Hydrocodone-Acetaminophen Other (See Comments)    Hallucinations and combative   Oxycodone Itching    Can take it with benadryl   Tapentadol Itching   Tramadol Itching    Can take it with benadryl   Adhesive [Tape] Itching and Rash    Prefers paper tape   Latex Itching and Rash    use paper tap    Social History:   Social History   Socioeconomic History   Marital status: Married    Spouse name: jeffrey   Number of children: 1   Years of education: Not on file   Highest education level: High school graduate  Occupational History    Comment: disabled  Tobacco Use   Smoking status: Former    Packs/day: 0.25    Years: 10.00    Pack years: 2.50    Types:  Cigarettes    Quit date: 2005    Years since quitting: 17.7   Smokeless tobacco: Never   Tobacco comments:    smoked for 10 years, 1 pack every 2-3 days  Vaping Use   Vaping Use: Never used  Substance and Sexual Activity   Alcohol use: Not Currently    Alcohol/week: 1.0 standard drink    Types: 1 Glasses of wine per week    Comment: rarely   Drug use: No   Sexual activity: Not Currently  Other Topics Concern   Not on file  Social History Narrative   Moved to [place] from [place], originally from [place].    1 cup of caffeine daily   Right handed          Social Determinants of Health   Financial Resource Strain: Low Risk    Difficulty of Paying Living Expenses: Not very hard  Food Insecurity: No Food Insecurity   Worried About Running Out of Food in the Last Year: Never true   Ran Out of Food in the Last Year: Never true  Transportation Needs: No Data processing manager (Medical): No   Lack of Transportation (Non-Medical): No  Physical Activity: Unknown   Days of Exercise per Week: 0 days   Minutes of Exercise per Session: Not on file  Stress: Stress Concern Present   Feeling of Stress : Rather much  Social Connections: Moderately Isolated   Frequency of Communication with Friends and Family: Never   Frequency of Social Gatherings with Friends and Family: Once a week   Attends Religious Services: More than 4 times per year   Active Member of [employer] or Organizations: No   Attends Music therapist: Never   Marital Status: Married  Human resources officer Violence: Not At Risk   Fear of Current or Ex-Partner: No   Emotionally Abused: No   Physically Abused: No   Sexually Abused: No    Family History:    Family History  Problem Relation Age of Onset   Cancer Sister 73       ovarian   Depression Sister    Cancer Brother        CLL   Cancer Father 14       colon   Heart disease Father    Heart attack Father 1        died from MI   Hyperlipidemia Father    Hypertension Father    Cancer Mother        leukemia   Breast cancer Paternal Aunt        65's   Migraines Neg Hx      ROS:  Please see the history of present illness.  Review of Systems  Constitutional:  Positive for malaise/fatigue.  Respiratory:  Positive for cough, sputum production, shortness of breath and wheezing. Negative for hemoptysis.   Cardiovascular:  Positive for chest pain and orthopnea. Negative for palpitations, claudication, leg swelling and PND.       Describes chest pain as more chest pressure.  No current chest pain/pressure at the time of consultation.  Gastrointestinal:  Positive for nausea and vomiting. Negative for blood in stool and melena.  Musculoskeletal:  Positive for falls.       Recent fall, during which time she did not hit her head.  Fall occurred when she tried to reach over to her side table to try to grab some food  Neurological:  Positive for dizziness and weakness. Negative for loss of consciousness.       Chronic dizziness   All other ROS reviewed and negative.     Physical Exam/Data:   Vitals:   04/07/21 2314 04/08/21 0523 04/08/21 0742 04/08/21 1113  BP: 122/68 (!) 128/91 109/78 116/76  Pulse: 94 93 (!) 102 (!) 51  Resp: _0 Temp: 97.7 F (36.5 C) 97.7 F (36.5 C) 98.1 F (36.7 C) 97.8 F (36.6 C)  TempSrc: Oral Oral Oral Oral  SpO2: 99% 99% 99% 100%  Weight:      Height:        Intake/Output Summary (Last 24 hours) at 04/08/2021 1315 Last data filed at 04/08/2021 1100 Gross per 24 hour  Intake 1388.27 ml  Output 700 ml  Net 688.27 ml   Last 3 Weights 04/07/2021 03/31/2021 03/24/2021  Weight (lbs) 286 lb 9.6 oz 277 lb 284 lb  Weight (kg) 130 kg 125.646 kg 128.822 kg  Some encounter information is confidential and restricted. Go to Review Flowsheets activity to see all data.     Body mass index  is 47.69 kg/m.  General: Obese female, in no acute distress HEENT:  normal Lymph: no adenopathy Neck: JVD difficult to assess due to body habitus Endocrine:  No thryomegaly Vascular: No carotid bruits; FA pulses 2+ bilaterally without bruits  Cardiac:  normal S1, S2; IRIR; no murmur.  Chemotherapy port located upper left chest Lungs: Faint bibasilar crackles s  Abd: soft, nontender, no hepatomegaly  Ext: no significant pitting bilateral lower extremity edema Musculoskeletal:  No deformities, BUE and BLE strength normal and equal Skin: warm and dry, chemotherapy port left chest as above with clean and dry dressing Neuro:  CNs 2-12 intact, no focal abnormalities noted Psych:  Normal affect   EKG:  The EKG was personally reviewed and demonstrates:  Afib with ventricular rate 83 bpm Telemetry:  Telemetry was personally reviewed and demonstrates: Atrial fibrillation with ventricular rates in the upper 90s and occasionally elevated into the 120s  Relevant CV Studies: Echo 03/08/2021  1. Left ventricular ejection fraction, by estimation, is 60 to 65%. The  left ventricle has normal function. Left ventricular endocardial border  not optimally defined to evaluate regional wall motion. There is mild left  ventricular hypertrophy. Left  ventricular diastolic parameters are consistent with Grade I diastolic  dysfunction (impaired relaxation). The average left ventricular global  longitudinal strain is -16.3 %. The global longitudinal strain is normal.   2. Right ventricular systolic function is normal. The right ventricular  size is normal.   3. Left atrial size was mildly dilated.   4. The mitral valve is normal in structure. Trivial mitral valve  regurgitation. No evidence of mitral stenosis.   5. The aortic valve is normal in structure. Aortic valve regurgitation is  not visualized. No aortic stenosis is present.  Laboratory Data:  High Sensitivity Troponin:   Recent Labs  Lab 04/05/21 1738 04/05/21 2027  TROPONINIHS <2 <2     Chemistry Recent Labs   Lab 04/06/21 0625 04/07/21 0410 04/08/21 0539  NA 138 139 137  K 3.5 3.2* 3.6  CL 104 106 106  CO2 _0 GLUCOSE 113* 102* 94  BUN 12 10 7*  CREATININE 0.88 0.76 0.70  CALCIUM 8.0* 8.3* 7.9*  GFRNONAA >60 >60 >60  ANIONGAP _1 Recent Labs  Lab 04/05/21 1738  PROT 6.9  ALBUMIN 3.7  AST 20  ALT 22  ALKPHOS 69  BILITOT 1.4*   Hematology Recent Labs  Lab 04/06/21 0625 04/07/21 0410 04/08/21 0539  WBC 1.1*  1.1* 1.7* 2.3*  RBC 3.60*  3.58* 3.61* 3.23*  HGB 11.5*  11.5* 11.2* 10.2*  HCT 32.9*  32.3* 32.5* 29.1*  MCV 91.4  90.2 90.0 90.1  MCH 31.9  32.1 31.0 31.6  MCHC 35.0  35.6 34.5 35.1  RDW 12.6  12.2 12.2 12.3  PLT 142*  131* 122* 121*   BNPNo results for input(s): BNP, PROBNP in the last 168 hours.  DDimer No results for input(s): DDIMER in the last 168 hours.   Radiology/Studies:  CT HEAD WO CONTRAST (5MM)  Result Date: 04/05/2021 CLINICAL DATA:  Dizziness nausea vomiting EXAM: CT HEAD WITHOUT CONTRAST TECHNIQUE: Contiguous axial images were obtained from the base of the skull through the vertex without intravenous contrast. COMPARISON:  CT brain 04/17/2018 FINDINGS: Brain: No evidence of acute infarction, hemorrhage, hydrocephalus, extra-axial collection or mass lesion/mass effect. Vascular: No hyperdense vessels. Mild carotid vascular calcification Skull: Normal. Negative for fracture or focal lesion. Sinuses/Orbits: No acute finding. Other: None IMPRESSION:  Negative non contrasted CT appearance of the brain Electronically Signed   By: Donavan Foil M.D.   On: 04/05/2021 18:33   CT ABDOMEN PELVIS W CONTRAST  Result Date: 04/05/2021 CLINICAL DATA:  C/O nausea, vomiting, nose bleeds since Saturday. Last chemo Wednesday. EXAM: CT ABDOMEN AND PELVIS WITH CONTRAST TECHNIQUE: Multidetector CT imaging of the abdomen and pelvis was performed using the standard protocol following bolus administration of intravenous contrast. CONTRAST:  121m OMNIPAQUE  IOHEXOL 350 MG/ML SOLN COMPARISON:  CT abdomen pelvis 11/05/2019 FINDINGS: Lower chest: No acute abnormality. Hepatobiliary: No focal liver abnormality. No gallstones, gallbladder wall thickening, or pericholecystic fluid. No biliary dilatation. Pancreas: No focal lesion. Normal pancreatic contour. No surrounding inflammatory changes. No main pancreatic ductal dilatation. Spleen: Normal in size without focal abnormality. Adrenals/Urinary Tract: No adrenal nodule bilaterally. Bilateral kidneys enhance symmetrically. There is a 2 cm fluid density lesion within the left kidney that likely represents a simple renal cyst. Subcentimeter hypodensities are too small to characterize. Punctate calcification within the right kidney. No hydronephrosis. No hydroureter. The urinary bladder is unremarkable. On delayed imaging, there is no urothelial wall thickening and there are no filling defects in the opacified portions of the bilateral collecting systems or ureters. Stomach/Bowel: Stomach is within normal limits. No evidence of bowel wall thickening or dilatation. Appendix appears normal. Vascular/Lymphatic: No abdominal aorta or iliac aneurysm. Severe atherosclerotic plaque of the aorta and its branches. No abdominal, pelvic, or inguinal lymphadenopathy. Reproductive: Not visualized due to streak artifact originating from the right femoral surgical hardware. Other: No intraperitoneal free fluid. No intraperitoneal free gas. No organized fluid collection. Musculoskeletal: No abdominal wall hernia or abnormality. No suspicious lytic or blastic osseous lesions. No acute displaced fracture. Multilevel degenerative changes of the spine. Total right hip arthroplasty. IMPRESSION: 1. No acute intra-abdominal or intrapelvic abnormality. 2.  Aortic Atherosclerosis (ICD10-I70.0). Electronically Signed   By: MIven FinnM.D.   On: 04/05/2021 20:16   DG Chest Portable 1 View  Result Date: 04/05/2021 CLINICAL DATA:  Shortness of  breath EXAM: PORTABLE CHEST 1 VIEW COMPARISON:  03/23/2021 FINDINGS: Left-sided central venous port with tip over the SVC. Cardiomegaly with vascular congestion. No consolidation, pleural effusion, or pneumothorax IMPRESSION: Cardiomegaly with vascular congestion Electronically Signed   By: KDonavan FoilM.D.   On: 04/05/2021 18:31     Assessment and Plan:   Paroxysmal atrial fibrillation on anticoagulation -- [facility].  Suspect most recent episode of atrial fibrillation was triggered by her chemotherapy and subsequent nausea/emesis.  In addition, she has been using albuterol more frequently.  As noted in the outpatient setting, she has leg heaviness and nausea when in atrial fibrillation.  Today, she also reports some chest heaviness and orthopnea, likely in the setting of mild volume overload given ongoing atrial fibrillation. Continue rate control with bisoprolol 5 mg twice daily.  Discontinued metoprolol, given her report of shortness of breath, and as patient has tolerated bisoprolol in the outpatient setting.  We will continue IV diltiazem for now with hope to consolidate rate control before discharge. Losartan and HCTZ discontinued to allow for room in BP during escalation of rate control.  Discontinued metoprolol. Do not recommend amiodarone, previously discontinued in the setting of nausea.   Consider transition of albuterol to Xopenex, given albuterol may elevate her ventricular rates. Continue anticoagulation/Eliquis 5 mg twice daily.  CHA2DS2VASc score of at least 4 (HFpEF, hypertension, age, female).  She denies signs or symptoms of  bleeding.  Of note, it is noted anticoagulation was briefly interrupted for insertion of chemotherapy port. No plan for DCCV at this time and given interruption in anticoagulation for insertion of chemotherapy port on 03/23/2021.  No plan for TEE/DCCV at this time.   HFpEF -- Continue current  Lasix and potassium supplementation.  She is mildly volume up on exam, though do not recommend aggressive diuresis, given her recent nausea/emesis and as she is unable to keep current oral intake down without emesis. HCTZ and Losartan held to allow for escalation of rate control as below, outpatient echo every 3 months in the setting of her chemotherapy.  Essential hypertension --BP well controlled.  Reviewed recommendation for BP 130/80 or lower.  Continue bisoprolol.  Per MD, and as above, losartan and HCTZ discontinued and to allow for escalation of rate control.  Continue Lasix and potassium supplementation.  HLD --Continue Crestor 20 mg daily.   Neutropenic fever /chemotherapy-induced gastritis/nausea -- Reports recent nausea and emesis, likely in the setting of recent chemotherapy and neutropenic fever.  In addition to nausea caused by chemotherapy, she does have a history of gastritis.  She also reports nausea with atrial fibrillation.  Continue PPI.  Continue IV antibiotics per oncology/IM, given concern for neutropenia.  Monitor volume status and renal function closely.  HER2 positive carcinoma of the breast with recent initiation of chemotherapy -- Per oncology.  Echocardiograms to be ordered by oncology/ echo every 3 months in the setting of her chemotherapy.            New York Heart Association (NYHA) Functional Class NYHA Class IV  CHA2DS2-VASc Score = 4   This indicates a 4.8% annual risk of stroke. The patient's score is based upon: CHF History: 1 HTN History: 1 Diabetes History: 0 Stroke History: 0 Vascular Disease History: 0 Age Score: 1 Gender Score: 1   For questions or updates, please contact [place] Please consult [email or web address] for contact info under    Signed, Arvil Chaco, PA-C  04/08/2021 1:15 PM

## 2021-04-08 NOTE — Care Management Important Message (Signed)
Important Message  Patient Details  Name: Amber Barnes MRN: [id number] Date of Birth: June 02, 1951   Medicare Important Message Given:  Yes     Dannette Barbara 04/08/2021, 4:16 PM

## 2021-04-08 NOTE — Evaluation (Signed)
Physical Therapy Evaluation Patient Details Name: Amber Barnes MRN: [id number] DOB: [birth year] Today's Date: 04/08/2021  History of Present Illness  Pt is a 70 y.o. female with medical history that includes breast cancer currently undergoing active chemotherapy, CHF, LBP, COPD, HTN, and R THA.  Pt presented to the emergency room with acute onset of nausea and vomiting, diarrhea, and abdominal pain with low-grade fever. MD assessment includes: Neutropenic fever with associated SIRS, chemo-induced refractory nausea, diarrhea, and A-fib.   Clinical Impression  Pt was pleasant and motivated to participate during the session and overall performed well.  Pt was Ind/Mod Ind with bed mobility and transfers with good control and stability. Pt ambulated 15 feet with slow cadence with min lean on the RW for support but was steady without LOB; pt's HR was in the upper 90s to low 100s at rest and up to 130s with ambulation, SpO2 100% on room air.  Pt's HR returned to baseline levels in around 30 sec of returning to sitting.  Pt reports decreased activity tolerance compared to her baseline as well as two falls related to tripping in the last several months and will benefit from HHPT upon discharge to safely address deficits listed in patient problem list for decreased caregiver assistance and eventual return to PLOF.         Recommendations for follow up therapy are one component of a multi-disciplinary discharge planning process, led by the attending physician.  Recommendations may be updated based on patient status, additional functional criteria and insurance authorization.  Follow Up Recommendations Home health PT;Supervision - Intermittent    Equipment Recommendations  None recommended by PT    Recommendations for Other Services       Precautions / Restrictions Precautions Precautions: None Restrictions Weight Bearing Restrictions: No Other Position/Activity Restrictions: Port in LUQ       Mobility  Bed Mobility Overal bed mobility: Independent                  Transfers Overall transfer level: Modified independent Equipment used: Rolling walker (2 wheeled)                Ambulation/Gait Ambulation/Gait assistance: Supervision Gait Distance (Feet): 15 Feet x 2 Assistive device: Rolling walker (2 wheeled) Gait Pattern/deviations: Step-through pattern;Decreased step length - right;Decreased step length - left Gait velocity: decreased   General Gait Details: Slow cadence with min lean on the RW for support but steady without LOB; HR in the upper 90s to low 100s at rest and up to 130s with ambulation, SpO2 100% on room air  Stairs            Wheelchair Mobility    Modified Rankin (Stroke Patients Only)       Balance Overall balance assessment: Needs assistance;History of Falls   Sitting balance-Leahy Scale: Normal     Standing balance support: Bilateral upper extremity supported;During functional activity Standing balance-Leahy Scale: Good Standing balance comment: Min lean on the RW for support                             Pertinent Vitals/Pain Pain Assessment: 0-10 Pain Score: 8  Pain Location: LUQ port site Pain Descriptors / Indicators: Sore Pain Intervention(s): Monitored during session;Premedicated before session    Home Living Family/patient expects to be discharged to:: Private residence Living Arrangements: Spouse/significant other;Children Available Help at Discharge: Family;Available 24 hours/day Type of Home: Apartment Home Access: Level entry  Home Layout: One level Home Equipment: Bedside commode;Walker - 2 wheels;Walker - 4 wheels;Cane - quad;Transport chair;Wheelchair - power      Prior Function Level of Independence: Independent with assistive device(s)         Comments: Ind amb without AD in the home, Mod Ind amb limited community distances with a QC, transport chair for longer community  distances, Ind with ADLs, 2 falls from tripping in the last 6 months     Hand Dominance        Extremity/Trunk Assessment   Upper Extremity Assessment Upper Extremity Assessment: Overall WFL for tasks assessed    Lower Extremity Assessment Lower Extremity Assessment: Overall WFL for tasks assessed       Communication   Communication: No difficulties  Cognition Arousal/Alertness: Awake/alert Behavior During Therapy: WFL for tasks assessed/performed Overall Cognitive Status: Within Functional Limits for tasks assessed                                        General Comments      Exercises Other Exercises Other Exercises: Pt education provided on general principles of activity progression Other Exercises: Sit to/from stand transfers from various height surfaces   Assessment/Plan    PT Assessment Patient needs continued PT services  PT Problem List Decreased activity tolerance;Decreased balance       PT Treatment Interventions DME instruction;Gait training;Functional mobility training;Therapeutic activities;Therapeutic exercise;Balance training;Patient/family education    PT Goals (Current goals can be found in the Care Plan section)  Acute Rehab PT Goals Patient Stated Goal: Improved strength and endurance PT Goal Formulation: With patient Time For Goal Achievement: 04/21/21 Potential to Achieve Goals: Good    Frequency Min 2X/week   Barriers to discharge        Co-evaluation               AM-PAC PT "6 Clicks" Mobility  Outcome Measure Help needed turning from your back to your side while in a flat bed without using bedrails?: None Help needed moving from lying on your back to sitting on the side of a flat bed without using bedrails?: None Help needed moving to and from a bed to a chair (including a wheelchair)?: None Help needed standing up from a chair using your arms (e.g., wheelchair or bedside chair)?: None Help needed to walk in  hospital room?: A Little Help needed climbing 3-5 steps with a railing? : A Little 6 Click Score: 22    End of Session   Activity Tolerance: Patient tolerated treatment well Patient left: in bed;with call bell/phone within reach;with bed alarm set;Other (comment) (Pt declined up in chair) Nurse Communication: Mobility status PT Visit Diagnosis: History of falling (Z91.81);Difficulty in walking, not elsewhere classified (R26.2)    Time: 6063-0160 PT Time Calculation (min) (ACUTE ONLY): 31 min   Charges:   PT Evaluation $PT Eval Low Complexity: 1 Low PT Treatments $Therapeutic Activity: 8-22 mins       D. Scott Lasya Vetter PT, DPT 04/08/21, 11:24 AM

## 2021-04-08 NOTE — Consult Note (Addendum)
Pharmacy Antibiotic Note  Amber Barnes is a 70 y.o. female with PMH of cancer undergoing chemotherapy, Afib, CHF, COPD, HTN, HLD, OSA who was admitted on 04/05/2021 with sepsis (unk source); neutropenic fever with associated SIRS.  Pharmacy has been consulted for vancomycin & cefepime dosing.  Pt has been afebrile since admission, WBC 1.0 >1.1>1.7>2.3, procal <0.10  Bcx have had NG x3 days and Ucx NG x2 days, MRSA PCR not detected   Plan: Vancomycin Continue vancomycin 1750 mg q24h Est AUC 492.5 based on SCr rounded to 0.8; Vd 0.5 (BMI >40); IBW) Order levels around 4th or 5th dose if continued  Cefepime Will continue cefepime 2g IV q8h Monitor renal function and adjust dose as clinically indicted Spoke with oncologist regarding duration of ABX, and per MD if [place] is >1 tomorrow plan to d/c ABX   Temp (24hrs), Avg:97.8 F (36.6 C), Min:97.6 F (36.4 C), Max:98 F (36.7 C)  Recent Labs  Lab 04/05/21 1738 04/05/21 2028 04/06/21 0625 04/07/21 0410 04/08/21 0539  WBC 1.0*  --  1.1*  1.1* 1.7* 2.3*  CREATININE 0.98  --  0.88 0.76 0.70  LATICACIDVEN 1.3 1.0  --   --   --      Estimated Creatinine Clearance: 91.6 mL/min (by C-G formula based on SCr of 0.7 mg/dL).    Allergies  Allergen Reactions   Prednisone Other (See Comments)    Agitation, hyperactivity, polyphagia   Amiodarone Nausea And Vomiting   Hydrocodone-Acetaminophen Other (See Comments)    Hallucinations and combative   Oxycodone Itching    Can take it with benadryl   Tapentadol Itching   Tramadol Itching    Can take it with benadryl   Adhesive [Tape] Itching and Rash    Prefers paper tape   Latex Itching and Rash    use paper tap    Antimicrobials this admission: Ongoing Vancomycin 9/26 >>  Cefepime 9/26 >>   Completed  Metronidazole 9/26 >>9/27  Dose adjustments this admission: 9/28 Vanc 1500 mg 24h > 1750 mg q24 h   Microbiology results: 9/26 BCx: NG x3 days 9/26 UCx: NG x2 days 9/26  MRSA PCR: negative  Thank you for allowing pharmacy to be a part of this patient's care.  Narda Rutherford, PharmD Pharmacy Resident  04/08/2021 12:50 PM

## 2021-04-08 NOTE — Progress Notes (Signed)
PROGRESS NOTE    Amber Barnes   VPX:[id number]  DOB: [birth year]  PCP: [name], FNP    DOA: 04/05/2021 LOS: 3    Brief Narrative / Hospital Course to Date:   70 year old female with past medical history of chronic A. fib, recently diagnosed breast cancer currently undergoing chemotherapy, hypertension, CHF, hyperlipidemia, and other comorbidities who presented to the ED on 04/05/2021 for evaluation of fever in addition to nausea vomiting and cramping abdominal pain.  She had some dyspnea and palpitations without chest pain.  Most recent chemo treatment was on Wednesday 9/21.  Patient follows with Dr. Tasia Catchings for oncology.  In the ED, patient was found to be neutropenic.  CT abdomen pelvis was unremarkable.  Chest x-ray with some vascular congestion but otherwise negative.  Treated with empiric broad-spectrum antibiotics and IV antiemetics in the ED in addition to IV fluids.  Admitted to [facility] service for further evaluation and management of neutropenic fever and A. fib with RVR.  Assessment & Plan   Active Problems:   Neutropenic fever (HCC)   Neutropenic fever with associated SIRS  -so far no source of infection to suggest sepsis.  ANC slow to trend up. Plan: --cont cefepime and vanc for now, per oncology --Granix x1 today  HER2 positive carcinoma of the breast chemo-induced Refractory nausea, improved --cont antiemetics PRN --small meals --Dr. Tasia Catchings following  Diarrhea, resolved --C diff and GI path were not collected since no more diarrhea since presentation.  Paroxysmal A. fib with RVR -present on admission, likely triggered by acute illness. Recent echo EF 60 to 65% with grade 1 diastolic dysfunction. --takes bisoprolol at home. --started on dilt gtt Plan: --cardiology consult today, Dr. [name] to see (pt's outpatient cardiologist) --cont dilt gtt --resume bisoprolol, per cardio --d/c Lopressor --cont Eliquis  Anxiety  -continue home Xanax BuSpar and  Cymbalta  GERD  -continue PPI  Essential hypertension  --cont lasix and biroprolol, dilt gtt --hold losartan and HCTZ  Restless leg syndrome -continue Requip  Hyperlipidemia- continue Crestor  Chronic diastolic CHF --cont home Lasix   Patient BMI: Body mass index is 47.69 kg/m.     DVT prophylaxis: IO:[id number] Code Status: Full code  Family Communication:  Status is: inpatient Dispo:   The patient is from: home Anticipated d/c is to: home Anticipated d/c date is: 1-2 days Patient currently is not medically stable to d/c due to: neutropenic fever on IV abx   Subjective 04/08/21    Pt reported having vomited multiple times overnight.  Today, she ate only few bites for breakfast and lunch, and avoided vomiting.   Consults, Procedures, Significant Events   Consultants:  Oncology  Procedures:  None  Antimicrobials:  Anti-infectives (From admission, onward)    Start     Dose/Rate Route Frequency Ordered Stop   04/07/21 1800  vancomycin (VANCOREADY) IVPB 1750 mg/350 mL        1,750 mg 175 mL/hr over 120 Minutes Intravenous Every 24 hours 04/07/21 0719 04/13/21 1759   04/06/21 1600  vancomycin (VANCOREADY) IVPB 1500 mg/300 mL  Status:  Discontinued        1,500 mg 150 mL/hr over 120 Minutes Intravenous Every 24 hours 04/05/21 2156 04/07/21 0719   04/06/21 0300  ceFEPIme (MAXIPIME) 2 g in sodium chloride 0.9 % 100 mL IVPB        2 g 200 mL/hr over 30 Minutes Intravenous Every 8 hours 04/05/21 2158 04/13/21 0559   04/05/21 2315  vancomycin (VANCOREADY) IVPB 1500 mg/300 mL  See Hyperspace for full Linked Orders Report.   1,500 mg 150 mL/hr over 120 Minutes Intravenous  Once 04/05/21 2145 04/06/21 0623   04/05/21 2200  ceFEPIme (MAXIPIME) 2 g in sodium chloride 0.9 % 100 mL IVPB  Status:  Discontinued        2 g 200 mL/hr over 30 Minutes Intravenous  Once 04/05/21 2136 04/06/21 0044   04/05/21 2200  metroNIDAZOLE (FLAGYL) IVPB 500 mg        500 mg 100 mL/hr  over 60 Minutes Intravenous Every 12 hours 04/05/21 2136 04/07/21 0959   04/05/21 2145  vancomycin (VANCOCIN) IVPB 1000 mg/200 mL premix  Status:  Discontinued        1,000 mg 200 mL/hr over 60 Minutes Intravenous  Once 04/05/21 2136 04/05/21 2145   04/05/21 2145  vancomycin (VANCOCIN) IVPB 1000 mg/200 mL premix  Status:  Discontinued       See Hyperspace for full Linked Orders Report.   1,000 mg 200 mL/hr over 60 Minutes Intravenous  Once 04/05/21 2145 04/07/21 0719   04/05/21 2000  vancomycin (VANCOREADY) IVPB 1500 mg/300 mL  Status:  Discontinued        1,500 mg 150 mL/hr over 120 Minutes Intravenous  Once 04/05/21 1842 04/05/21 2145   04/05/21 1845  ceFEPIme (MAXIPIME) 2 g in sodium chloride 0.9 % 100 mL IVPB        2 g 200 mL/hr over 30 Minutes Intravenous  Once 04/05/21 1831 04/05/21 2013   04/05/21 1845  metroNIDAZOLE (FLAGYL) IVPB 500 mg        500 mg 100 mL/hr over 60 Minutes Intravenous  Once 04/05/21 1831 04/05/21 2132   04/05/21 1845  vancomycin (VANCOCIN) IVPB 1000 mg/200 mL premix  Status:  Discontinued        1,000 mg 200 mL/hr over 60 Minutes Intravenous  Once 04/05/21 1831 04/05/21 2340         Micro    Objective   Vitals:   04/08/21 0523 04/08/21 0742 04/08/21 1113 04/08/21 1557  BP: (!) 128/91 109/78 116/76 108/79  Pulse: 93 (!) 102 (!) 51 (!) 102  Resp: 16 18 18 18   Temp: 97.7 F (36.5 C) 98.1 F (36.7 C) 97.8 F (36.6 C) 97.7 F (36.5 C)  TempSrc: Oral Oral Oral Oral  SpO2: 99% 99% 100% 98%  Weight:      Height:        Intake/Output Summary (Last 24 hours) at 04/08/2021 1617 Last data filed at 04/08/2021 1100 Gross per 24 hour  Intake 1148.27 ml  Output 400 ml  Net 748.27 ml   Filed Weights   04/07/21 0046  Weight: 130 kg    Physical Exam:  Constitutional: NAD, AAOx3 HEENT: conjunctivae and lids normal, EOMI CV: No cyanosis.   RESP: normal respiratory effort, on RA Extremities: No effusions, edema in BLE SKIN: warm, dry Neuro: II -  XII grossly intact.   Psych: Normal mood and affect.  Appropriate judgement and reason   Labs   Data Reviewed: I have personally reviewed following labs and imaging studies  CBC: Recent Labs  Lab 04/05/21 1738 04/06/21 0625 04/07/21 0410 04/08/21 0539  WBC 1.0* 1.1*  1.1* 1.7* 2.3*  NEUTROABS 0.1* 0.2* 0.5* 0.6*  HGB 13.5 11.5*  11.5* 11.2* 10.2*  HCT 37.8 32.9*  32.3* 32.5* 29.1*  MCV 88.5 91.4  90.2 90.0 90.1  PLT 160 142*  131* 122* 384*   Basic Metabolic Panel: Recent Labs  Lab 04/05/21 1738 04/06/21 5364  04/07/21 0410 04/08/21 0539  NA 136 138 139 137  K 3.8 3.5 3.2* 3.6  CL 103 104 106 106  CO2 25 27 25 25   GLUCOSE 117* 113* 102* 94  BUN 17 12 10  7*  CREATININE 0.98 0.88 0.76 0.70  CALCIUM 8.3* 8.0* 8.3* 7.9*  MG 2.0  --  2.0 1.8   GFR: Estimated Creatinine Clearance: 91.6 mL/min (by C-G formula based on SCr of 0.7 mg/dL). Liver Function Tests: Recent Labs  Lab 04/05/21 1738  AST 20  ALT 22  ALKPHOS 69  BILITOT 1.4*  PROT 6.9  ALBUMIN 3.7   No results for input(s): LIPASE, AMYLASE in the last 168 hours. No results for input(s): AMMONIA in the last 168 hours. Coagulation Profile: Recent Labs  Lab 04/06/21 0625  INR 1.3*   Cardiac Enzymes: No results for input(s): CKTOTAL, CKMB, CKMBINDEX, TROPONINI in the last 168 hours. BNP (last 3 results) No results for input(s): PROBNP in the last 8760 hours. HbA1C: No results for input(s): HGBA1C in the last 72 hours. CBG: No results for input(s): GLUCAP in the last 168 hours. Lipid Profile: No results for input(s): CHOL, HDL, LDLCALC, TRIG, CHOLHDL, LDLDIRECT in the last 72 hours. Thyroid Function Tests: No results for input(s): TSH, T4TOTAL, FREET4, T3FREE, THYROIDAB in the last 72 hours. Anemia Panel: No results for input(s): VITAMINB12, FOLATE, FERRITIN, TIBC, IRON, RETICCTPCT in the last 72 hours. Sepsis Labs: Recent Labs  Lab 04/05/21 1738 04/05/21 2028 04/06/21 0625  PROCALCITON  --    --  <0.10  LATICACIDVEN 1.3 1.0  --     Recent Results (from the past 240 hour(s))  Blood culture (routine x 2)     Status: None (Preliminary result)   Collection Time: 04/05/21  5:36 PM   Specimen: BLOOD  Result Value Ref Range Status   Specimen Description BLOOD LEFT ANTECUBITAL  Final   Special Requests   Final    BOTTLES DRAWN AEROBIC AND ANAEROBIC Blood Culture adequate volume   Culture   Final    NO GROWTH 3 DAYS Performed at [facility], [address], [place], [place] [address]    Report Status PENDING  Incomplete  Resp Panel by RT-PCR (Flu A&B, Covid) Nasopharyngeal Swab     Status: None   Collection Time: 04/05/21  5:38 PM   Specimen: Nasopharyngeal Swab; Nasopharyngeal(NP) swabs in vial transport medium  Result Value Ref Range Status   SARS Coronavirus 2 by RT PCR NEGATIVE NEGATIVE Final    Comment: (NOTE) SARS-CoV-2 target nucleic acids are NOT DETECTED.  The SARS-CoV-2 RNA is generally detectable in upper respiratory specimens during the acute phase of infection. The lowest concentration of SARS-CoV-2 viral copies this assay can detect is 138 copies/mL. A negative result does not preclude SARS-Cov-2 infection and should not be used as the sole basis for treatment or other patient management decisions. A negative result may occur with  improper specimen collection/handling, submission of specimen other than nasopharyngeal swab, presence of viral mutation(s) within the areas targeted by this assay, and inadequate number of viral copies(<138 copies/mL). A negative result must be combined with clinical observations, patient history, and epidemiological information. The expected result is Negative.  Fact Sheet for Patients:  EntrepreneurPulse.com.au  Fact Sheet for Healthcare Providers:  IncredibleEmployment.be  This test is no t yet approved or cleared by the [place] FDA and  has been authorized for detection  and/or diagnosis of SARS-CoV-2 by FDA under an Emergency Use Authorization (EUA). This EUA will  remain  in effect (meaning this test can be used) for the duration of the COVID-19 declaration under Section 564(b)(1) of the Act, 21 U.S.C.section 360bbb-3(b)(1), unless the authorization is terminated  or revoked sooner.       Influenza A by PCR NEGATIVE NEGATIVE Final   Influenza B by PCR NEGATIVE NEGATIVE Final    Comment: (NOTE) The Xpert Xpress SARS-CoV-2/FLU/RSV plus assay is intended as an aid in the diagnosis of influenza from Nasopharyngeal swab specimens and should not be used as a sole basis for treatment. Nasal washings and aspirates are unacceptable for Xpert Xpress SARS-CoV-2/FLU/RSV testing.  Fact Sheet for Patients: EntrepreneurPulse.com.au  Fact Sheet for Healthcare Providers: IncredibleEmployment.be  This test is not yet approved or cleared by the [place] FDA and has been authorized for detection and/or diagnosis of SARS-CoV-2 by FDA under an Emergency Use Authorization (EUA). This EUA will remain in effect (meaning this test can be used) for the duration of the COVID-19 declaration under Section 564(b)(1) of the Act, 21 U.S.C. section 360bbb-3(b)(1), unless the authorization is terminated or revoked.  Performed at [facility], [address] [place], [place] [address]   Blood culture (routine x 2)     Status: None (Preliminary result)   Collection Time: 04/05/21  7:04 PM   Specimen: BLOOD  Result Value Ref Range Status   Specimen Description BLOOD BLOOD LEFT FOREARM  Final   Special Requests   Final    BOTTLES DRAWN AEROBIC AND ANAEROBIC Blood Culture adequate volume   Culture   Final    NO GROWTH 3 DAYS Performed at [facility], [address], [place], [place] [address]    Report Status PENDING  Incomplete  Urine Culture     Status: None   Collection Time: 04/06/21  4:04 AM   Specimen:  Urine, Random  Result Value Ref Range Status   Specimen Description   Final    URINE, RANDOM Performed at [facility], [address] [place], [place] [address]    Special Requests   Final    NONE Performed at [facility], [address] [place], [place] [address]    Culture   Final    NO GROWTH Performed at [facility], [place] [address], [place], [place] [address]    Report Status 04/07/2021 FINAL  Final  MRSA Next Gen by PCR, Nasal     Status: None   Collection Time: 04/06/21  4:04 AM   Specimen: Nasal Mucosa; Nasal Swab  Result Value Ref Range Status   MRSA by PCR Next Gen NOT DETECTED NOT DETECTED Final    Comment: (NOTE) The GeneXpert MRSA Assay (FDA approved for NASAL specimens only), is one component of a comprehensive MRSA colonization surveillance program. It is not intended to diagnose MRSA infection nor to guide or monitor treatment for MRSA infections. Test performance is not FDA approved in patients less than 57 years old. Performed at [facility], [address] [place],  [address]       Imaging Studies   No results found.   Medications   Scheduled Meds:  apixaban  5 mg Oral BID   B-complex with vitamin C   Oral Daily   bisoprolol  5 mg Oral BID   calcium-vitamin D  1 tablet Oral Q breakfast   Chlorhexidine Gluconate Cloth  6 each Topical Daily   cholecalciferol  5,000 Units Oral Daily   DULoxetine  30 mg Oral BID  furosemide  20 mg Oral BID   multivitamin with minerals  1 tablet Oral Daily   pantoprazole  40 mg Oral Daily   potassium chloride SA  20 mEq Oral BID   ranolazine  500 mg Oral BID   rOPINIRole  1 mg Oral QHS   rosuvastatin  20 mg Oral Daily   sodium chloride flush  10-40 mL Intracatheter Q12H   Tbo-filgastrim (GRANIX) SQ  480 mcg Subcutaneous ONCE-1800   Continuous Infusions:  ceFEPime (MAXIPIME) IV 2 g (04/08/21 1549)   diltiazem (CARDIZEM) infusion 8 mg/hr (04/08/21 1312)    vancomycin 1,750 mg (04/07/21 1717)       LOS: 3 days    Enzo Bi, md [facility]  04/08/2021, 4:17 PM

## 2021-04-09 DIAGNOSIS — D701 Agranulocytosis secondary to cancer chemotherapy: Secondary | ICD-10-CM

## 2021-04-09 DIAGNOSIS — F419 Anxiety disorder, unspecified: Secondary | ICD-10-CM

## 2021-04-09 DIAGNOSIS — D709 Neutropenia, unspecified: Secondary | ICD-10-CM

## 2021-04-09 DIAGNOSIS — I4819 Other persistent atrial fibrillation: Secondary | ICD-10-CM | POA: Diagnosis not present

## 2021-04-09 DIAGNOSIS — T451X5A Adverse effect of antineoplastic and immunosuppressive drugs, initial encounter: Secondary | ICD-10-CM

## 2021-04-09 DIAGNOSIS — R110 Nausea: Secondary | ICD-10-CM | POA: Diagnosis not present

## 2021-04-09 LAB — CBC WITH DIFFERENTIAL/PLATELET
Abs Immature Granulocytes: 1.11 10*3/uL — ABNORMAL HIGH (ref 0.00–0.07)
Basophils Absolute: 0 10*3/uL (ref 0.0–0.1)
Basophils Relative: 1 %
Eosinophils Absolute: 0.1 10*3/uL (ref 0.0–0.5)
Eosinophils Relative: 1 %
HCT: 28.4 % — ABNORMAL LOW (ref 36.0–46.0)
Hemoglobin: 10 g/dL — ABNORMAL LOW (ref 12.0–15.0)
Immature Granulocytes: 19 %
Lymphocytes Relative: 34 %
Lymphs Abs: 1.9 10*3/uL (ref 0.7–4.0)
MCH: 31.8 pg (ref 26.0–34.0)
MCHC: 35.2 g/dL (ref 30.0–36.0)
MCV: 90.4 fL (ref 80.0–100.0)
Monocytes Absolute: 1.4 10*3/uL — ABNORMAL HIGH (ref 0.1–1.0)
Monocytes Relative: 25 %
Neutro Abs: 1.2 10*3/uL — ABNORMAL LOW (ref 1.7–7.7)
Neutrophils Relative %: 20 %
Platelets: 143 10*3/uL — ABNORMAL LOW (ref 150–400)
RBC: 3.14 MIL/uL — ABNORMAL LOW (ref 3.87–5.11)
RDW: 12.7 % (ref 11.5–15.5)
Smear Review: NORMAL
WBC: 5.7 10*3/uL (ref 4.0–10.5)
nRBC: 0.9 % — ABNORMAL HIGH (ref 0.0–0.2)

## 2021-04-09 LAB — BASIC METABOLIC PANEL
Anion gap: 6 (ref 5–15)
BUN: 7 mg/dL — ABNORMAL LOW (ref 8–23)
CO2: 24 mmol/L (ref 22–32)
Calcium: 7.6 mg/dL — ABNORMAL LOW (ref 8.9–10.3)
Chloride: 107 mmol/L (ref 98–111)
Creatinine, Ser: 0.88 mg/dL (ref 0.44–1.00)
GFR, Estimated: >60 mL/min (ref >60)
Glucose, Bld: 88 mg/dL (ref 70–99)
Potassium: 3.6 mmol/L (ref 3.5–5.1)
Sodium: 137 mmol/L (ref 135–145)

## 2021-04-09 LAB — MAGNESIUM: Magnesium: 1.6 mg/dL — ABNORMAL LOW (ref 1.7–2.4)

## 2021-04-09 MED ORDER — BISOPROLOL FUMARATE 5 MG PO TABS
5.0000 mg | ORAL_TABLET | Freq: Two times a day (BID) | ORAL | Status: DC
  Administered 2021-04-09: 5 mg via ORAL
  Filled 2021-04-09 (×2): qty 1

## 2021-04-09 MED ORDER — CALCIUM GLUCONATE-NACL 1-0.675 GM/50ML-% IV SOLN
1.0000 g | Freq: Once | INTRAVENOUS | Status: DC
  Filled 2021-04-09: qty 50

## 2021-04-09 MED ORDER — MAGNESIUM SULFATE 4 GM/100ML IV SOLN
4.0000 g | Freq: Once | INTRAVENOUS | Status: AC
  Administered 2021-04-09: 4 g via INTRAVENOUS
  Filled 2021-04-09: qty 100

## 2021-04-09 MED ORDER — LOSARTAN POTASSIUM 25 MG PO TABS: 12.5000 mg | ORAL_TABLET | Freq: Every day | ORAL | Status: DC

## 2021-04-09 MED ORDER — CALCIUM GLUCONATE-NACL 2-0.675 GM/100ML-% IV SOLN
2.0000 g | Freq: Once | INTRAVENOUS | Status: AC
  Administered 2021-04-09: 2000 mg via INTRAVENOUS
  Filled 2021-04-09: qty 100

## 2021-04-09 MED ORDER — BISOPROLOL FUMARATE 5 MG PO TABS
5.0000 mg | ORAL_TABLET | Freq: Two times a day (BID) | ORAL | Status: DC | PRN
  Filled 2021-04-09: qty 1

## 2021-04-09 MED ORDER — HEPARIN SOD (PORK) LOCK FLUSH 100 UNIT/ML IV SOLN
500.0000 [IU] | Freq: Once | INTRAVENOUS | Status: DC
  Filled 2021-04-09 (×2): qty 5

## 2021-04-09 MED ORDER — LOSARTAN POTASSIUM 25 MG PO TABS: 12.5000 mg | ORAL_TABLET | Freq: Every day | ORAL | 0 refills | Status: DC

## 2021-04-09 MED ORDER — POTASSIUM CHLORIDE CRYS ER 20 MEQ PO TBCR: 20.0000 meq | EXTENDED_RELEASE_TABLET | Freq: Once | ORAL | Status: DC

## 2021-04-09 MED ORDER — DULOXETINE HCL 30 MG PO CPEP
30.0000 mg | ORAL_CAPSULE | Freq: Two times a day (BID) | ORAL | Status: DC
Start: 2021-04-09 — End: 2021-10-26

## 2021-04-09 MED ORDER — BISOPROLOL FUMARATE 5 MG PO TABS: 5.0000 mg | ORAL_TABLET | Freq: Two times a day (BID) | ORAL | 1 refills | Status: DC

## 2021-04-09 NOTE — Discharge Summary (Signed)
Physician Discharge Summary   Amber Barnes  female DOB: January 27, 1951  URK:[id number]  PCP: [name], FNP  Admit date: 04/05/2021 Discharge date: 04/09/2021  Admitted From: home Disposition:  home Home Health: Yes CODE STATUS: Full code  Discharge Instructions     Discharge instructions   Complete by: As directed    You have been empirically treated with IV antibiotics for neutropenic fever, but so far, no infection has been found.  Your neutrophile count has gone up, so Dr. Tasia Catchings is ok with you going home.  Please follow up with Dr. Tasia Catchings in 2 weeks.  Cardiologist has made some changes to your medications.  Please take them as directed.   Dr. Enzo Bi - -   No wound care   Complete by: As directed         Hospital Course:  For full details, please see H&P, progress notes, consult notes and ancillary notes.  Briefly,  Amber Barnes is a 70 year old female with past medical history of chronic A. fib, recently diagnosed breast cancer currently undergoing chemotherapy, hypertension, CHF, who presented to the ED on 04/05/2021 for evaluation of fever in addition to nausea vomiting and cramping abdominal pain.    She had some dyspnea and palpitations without chest pain.  Most recent chemo treatment was on Wednesday 9/21.  Patient follows with Dr. Tasia Catchings for oncology.  In the ED, patient was found to be neutropenic.  CT abdomen pelvis was unremarkable.  Chest x-ray with some vascular congestion but otherwise negative.   Treated with empiric broad-spectrum antibiotics and IV antiemetics in the ED in addition to IV fluids.  Admitted to [facility] service for further evaluation and management of neutropenic fever and A. fib with RVR.  Neutropenic fever with associated SIRS  --no source of infection to suggest sepsis.  ANC slow to trend up.  Pt received Granix x2 during hospitalization. --Pt received empiric cefepime and vanc until the day of discharge, when [place] increased to 1.4.    HER2 positive carcinoma of the breast chemo-induced Refractory nausea, improved --cont antiemetics PRN --small meals --Dr. Tasia Catchings following   Diarrhea, resolved --C diff and GI path were not collected since no more diarrhea since presentation.   Paroxysmal A. fib with RVR -present on admission, likely triggered by acute illness. Recent echo EF 60 to 65% with grade 1 diastolic dysfunction. --takes bisoprolol at home. --started on dilt gtt and Lopressor by previous hospitalist and transitioned back to home bisoprolol 5 mg BID when rate controlled, per cardiology rec.   --cont Eliquis   Anxiety  -continue home Xanax BuSpar and Cymbalta  GERD  -continue PPI  Essential hypertension  --BP had been normal without home BP meds. --cont home lasix and biroprolol --Home losartan reduced from 100 mg daily to 12.5 mg daily by cardiology due to soft BP. --Home HCTZ d/c'ed.  Restless leg syndrome -continue Requip   Hyperlipidemia - continue Crestor   Chronic diastolic CHF --cont home Lasix   Morbid obesity, BMI: Body mass index is 47.69 kg/m.    Discharge Diagnoses:  Active Problems:   Neutropenic fever ([place])   30 Day Unplanned Readmission Risk Score    Flowsheet Row ED to Hosp-Admission (Current) from 04/05/2021 in [facility]  30 Day Unplanned Readmission Risk Score (%) 25.37 Filed at 04/09/2021 0801       This score is the patient's risk of an unplanned readmission within 30 days of being discharged (0 -100%). The score is based  on dignosis, age, lab data, medications, orders, and past utilization.   Low:  0-14.9   Medium: 15-21.9   High: 22-29.9   Extreme: 30 and above         Discharge Instructions:  Allergies as of 04/09/2021       Reactions   Prednisone Other (See Comments)   Agitation, hyperactivity, polyphagia   Amiodarone Nausea And Vomiting   Hydrocodone-acetaminophen Other (See Comments)   Hallucinations and combative   Oxycodone  Itching   Can take it with benadryl   Tapentadol Itching   Tramadol Itching   Can take it with benadryl   Adhesive [tape] Itching, Rash   Prefers paper tape   Latex Itching, Rash   use paper tap        Medication List     STOP taking these medications    ALPRAZolam 0.25 MG tablet Commonly known as: XANAX   hydrochlorothiazide 12.5 MG capsule Commonly known as: MICROZIDE       TAKE these medications    acetaminophen 500 MG tablet Commonly known as: TYLENOL Take 500 mg by mouth every 6 (six) hours as needed. Taking as needed   albuterol 108 (90 Base) MCG/ACT inhaler Commonly known as: VENTOLIN HFA INHALE 2 PUFFS INTO THE LUNGS EVERY 8 (EIGHT) HOURS AS NEEDED FOR WHEEZING OR SHORTNESS OF BREATH.   azelastine 0.1 % nasal spray Commonly known as: ASTELIN Place into both nostrils as needed for rhinitis. Use in each nostril as directed   bisoprolol 5 MG tablet Commonly known as: ZEBETA Take 1 tablet (5 mg total) by mouth 2 (two) times daily. What changed: See the new instructions.   busPIRone 5 MG tablet Commonly known as: BUSPAR TAKE 1 TABLET BY MOUTH THREE TIMES A DAY AS NEEDED   calcium gluconate 500 MG tablet Take 500 tablets by mouth daily.   calcium-vitamin D 500-200 MG-UNIT tablet Commonly known as: OSCAL WITH D Take 1 tablet by mouth daily with breakfast.   DULoxetine 30 MG capsule Commonly known as: CYMBALTA Take 1 capsule (30 mg total) by mouth 2 (two) times daily. Home med. What changed:  how much to take how to take this when to take this additional instructions   Eliquis 5 MG Tabs tablet Generic drug: apixaban TAKE 1 TABLET BY MOUTH TWICE A DAY   esomeprazole 20 MG capsule Commonly known as: NEXIUM Take 20 mg by mouth as needed.   furosemide 20 MG tablet Commonly known as: LASIX Take 1 tablet (20 mg total) by mouth 2 (two) times daily.   lidocaine-prilocaine cream Commonly known as: EMLA Apply to affected area once   LORazepam  0.5 MG tablet Commonly known as: Ativan Take 1 tablet (0.5 mg total) by mouth every 8 (eight) hours as needed for anxiety (nausea/ vomiting).   losartan 25 MG tablet Commonly known as: COZAAR Take 0.5 tablets (12.5 mg total) by mouth daily. Reduced from 100 mg daily. Start taking on: April 10, 2021 What changed:  medication strength See the new instructions.   multivitamin tablet Take 1 tablet by mouth daily.   ondansetron 8 MG tablet Commonly known as: ZOFRAN Take 1 tablet (8 mg total) by mouth every 8 (eight) hours as needed for nausea or vomiting.   potassium chloride SA 20 MEQ tablet Commonly known as: KLOR-CON Take 1 tablet (20 mEq total) by mouth 2 (two) times daily.   prochlorperazine 10 MG tablet Commonly known as: COMPAZINE Take 1 tablet (10 mg total) by mouth every 6 (six)  hours as needed (Nausea or vomiting).   promethazine 25 MG tablet Commonly known as: PHENERGAN Take 1 tablet (25 mg total) by mouth every 6 (six) hours as needed for nausea or vomiting.   ranolazine 500 MG 12 hr tablet Commonly known as: RANEXA TAKE 1 TABLET BY MOUTH TWICE A DAY   rizatriptan 10 MG tablet Commonly known as: MAXALT TAKE 1 TABLET (10 MG TOTAL) BY MOUTH AS NEEDED FOR MIGRAINE. MAY REPEAT IN 2 HOURS IF NEEDED   rOPINIRole 1 MG tablet Commonly known as: REQUIP TAKE 1 TABLET BY MOUTH AT BEDTIME.   rosuvastatin 20 MG tablet Commonly known as: CRESTOR TAKE 1 TABLET BY MOUTH EVERY DAY   VITAMIN B COMPLEX PO Take 1 tablet by mouth daily.   Vitamin D3 125 MCG (5000 UT) Caps Take 5,000 Units by mouth daily.         Follow-up Information     [name], FNP Follow up in 1 week(s).   Specialty: Family Medicine Contact information: [place] [address] [place] [address] [phone]         Minna Merritts, MD Follow up in 1 week(s).   Specialty: Cardiology Contact information: [place]  [address] [phone]         [name], MD Follow up in 2 week(s).   Specialty: Oncology Contact information: [place] [address] [phone]                 Allergies  Allergen Reactions   Prednisone Other (See Comments)    Agitation, hyperactivity, polyphagia   Amiodarone Nausea And Vomiting   Hydrocodone-Acetaminophen Other (See Comments)    Hallucinations and combative   Oxycodone Itching    Can take it with benadryl   Tapentadol Itching   Tramadol Itching    Can take it with benadryl   Adhesive [Tape] Itching and Rash    Prefers paper tape   Latex Itching and Rash    use paper tap     The results of significant diagnostics from this hospitalization (including imaging, microbiology, ancillary and laboratory) are listed below for reference.   Consultations:   Procedures/Studies: CT HEAD WO CONTRAST (5MM)  Result Date: 04/05/2021 CLINICAL DATA:  Dizziness nausea vomiting EXAM: CT HEAD WITHOUT CONTRAST TECHNIQUE: Contiguous axial images were obtained from the base of the skull through the vertex without intravenous contrast. COMPARISON:  CT brain 04/17/2018 FINDINGS: Brain: No evidence of acute infarction, hemorrhage, hydrocephalus, extra-axial collection or mass lesion/mass effect. Vascular: No hyperdense vessels. Mild carotid vascular calcification Skull: Normal. Negative for fracture or focal lesion. Sinuses/Orbits: No acute finding. Other: None IMPRESSION: Negative non contrasted CT appearance of the brain Electronically Signed   By: Donavan Foil M.D.   On: 04/05/2021 18:33   CT ABDOMEN PELVIS W CONTRAST  Result Date: 04/05/2021 CLINICAL DATA:  C/O nausea, vomiting, nose bleeds since Saturday. Last chemo Wednesday. EXAM: CT ABDOMEN AND PELVIS WITH CONTRAST TECHNIQUE: Multidetector CT imaging of the abdomen and pelvis was performed using the standard protocol following bolus administration of intravenous contrast. CONTRAST:  160m  OMNIPAQUE IOHEXOL 350 MG/ML SOLN COMPARISON:  CT abdomen pelvis 11/05/2019 FINDINGS: Lower chest: No acute abnormality. Hepatobiliary: No focal liver abnormality. No gallstones, gallbladder wall thickening, or pericholecystic fluid. No biliary dilatation. Pancreas: No focal lesion. Normal pancreatic contour. No surrounding inflammatory changes. No main pancreatic ductal dilatation. Spleen: Normal in size without focal abnormality. Adrenals/Urinary Tract: No adrenal nodule bilaterally.  Bilateral kidneys enhance symmetrically. There is a 2 cm fluid density lesion within the left kidney that likely represents a simple renal cyst. Subcentimeter hypodensities are too small to characterize. Punctate calcification within the right kidney. No hydronephrosis. No hydroureter. The urinary bladder is unremarkable. On delayed imaging, there is no urothelial wall thickening and there are no filling defects in the opacified portions of the bilateral collecting systems or ureters. Stomach/Bowel: Stomach is within normal limits. No evidence of bowel wall thickening or dilatation. Appendix appears normal. Vascular/Lymphatic: No abdominal aorta or iliac aneurysm. Severe atherosclerotic plaque of the aorta and its branches. No abdominal, pelvic, or inguinal lymphadenopathy. Reproductive: Not visualized due to streak artifact originating from the right femoral surgical hardware. Other: No intraperitoneal free fluid. No intraperitoneal free gas. No organized fluid collection. Musculoskeletal: No abdominal wall hernia or abnormality. No suspicious lytic or blastic osseous lesions. No acute displaced fracture. Multilevel degenerative changes of the spine. Total right hip arthroplasty. IMPRESSION: 1. No acute intra-abdominal or intrapelvic abnormality. 2.  Aortic Atherosclerosis (ICD10-I70.0). Electronically Signed   By: Iven Finn M.D.   On: 04/05/2021 20:16   PERIPHERAL VASCULAR CATHETERIZATION  Result Date: 03/23/2021 See  surgical note for result.  DG Chest Portable 1 View  Result Date: 04/05/2021 CLINICAL DATA:  Shortness of breath EXAM: PORTABLE CHEST 1 VIEW COMPARISON:  03/23/2021 FINDINGS: Left-sided central venous port with tip over the SVC. Cardiomegaly with vascular congestion. No consolidation, pleural effusion, or pneumothorax IMPRESSION: Cardiomegaly with vascular congestion Electronically Signed   By: Donavan Foil M.D.   On: 04/05/2021 18:31   DG Chest Port 1 View  Result Date: 03/23/2021 CLINICAL DATA:  Pneumothorax EXAM: PORTABLE CHEST 1 VIEW COMPARISON:  11/05/2019 FINDINGS: Lung volumes are small but are symmetric and are clear. No pneumothorax or pleural effusion. Mild cardiomegaly is stable. Pulmonary vascularity is normal. IMPRESSION: No pneumothorax. Stable cardiomegaly Electronically Signed   By: Fidela Salisbury M.D.   On: 03/23/2021 14:35      Labs: BNP (last 3 results) No results for input(s): BNP in the last 8760 hours. Basic Metabolic Panel: Recent Labs  Lab 04/05/21 1738 04/06/21 0625 04/07/21 0410 04/08/21 0539 04/09/21 0440  NA 136 138 139 137 137  K 3.8 3.5 3.2* 3.6 3.6  CL 103 104 106 106 107  CO2 25 27 25 25 24   GLUCOSE 117* 113* 102* 94 88  BUN 17 12 10  7* 7*  CREATININE 0.98 0.88 0.76 0.70 0.88  CALCIUM 8.3* 8.0* 8.3* 7.9* 7.6*  MG 2.0  --  2.0 1.8 1.6*   Liver Function Tests: Recent Labs  Lab 04/05/21 1738  AST 20  ALT 22  ALKPHOS 69  BILITOT 1.4*  PROT 6.9  ALBUMIN 3.7   No results for input(s): LIPASE, AMYLASE in the last 168 hours. No results for input(s): AMMONIA in the last 168 hours. CBC: Recent Labs  Lab 04/05/21 1738 04/06/21 0625 04/07/21 0410 04/08/21 0539 04/09/21 0440  WBC 1.0* 1.1*  1.1* 1.7* 2.3* 5.7  NEUTROABS 0.1* 0.2* 0.5* 0.6* 1.2*  HGB 13.5 11.5*  11.5* 11.2* 10.2* 10.0*  HCT 37.8 32.9*  32.3* 32.5* 29.1* 28.4*  MCV 88.5 91.4  90.2 90.0 90.1 90.4  PLT 160 142*  131* 122* 121* 143*   Cardiac Enzymes: No results for  input(s): CKTOTAL, CKMB, CKMBINDEX, TROPONINI in the last 168 hours. BNP: Invalid input(s): POCBNP CBG: No results for input(s): GLUCAP in the last 168 hours. D-Dimer No results for input(s): DDIMER in the last 72  hours. Hgb A1c No results for input(s): HGBA1C in the last 72 hours. Lipid Profile No results for input(s): CHOL, HDL, LDLCALC, TRIG, CHOLHDL, LDLDIRECT in the last 72 hours. Thyroid function studies No results for input(s): TSH, T4TOTAL, T3FREE, THYROIDAB in the last 72 hours.  Invalid input(s): FREET3 Anemia work up No results for input(s): VITAMINB12, FOLATE, FERRITIN, TIBC, IRON, RETICCTPCT in the last 72 hours. Urinalysis    Component Value Date/Time   COLORURINE YELLOW (A) 04/06/2021 0404   APPEARANCEUR CLEAR (A) 04/06/2021 0404   APPEARANCEUR Clear 10/10/2014 2137   LABSPEC >1.046 (H) 04/06/2021 0404   LABSPEC 1.006 10/10/2014 2137   PHURINE 5.0 04/06/2021 0404   GLUCOSEU NEGATIVE 04/06/2021 0404   GLUCOSEU Negative 10/10/2014 2137   HGBUR NEGATIVE 04/06/2021 0404   BILIRUBINUR NEGATIVE 04/06/2021 0404   BILIRUBINUR neg 05/17/2016 1408   BILIRUBINUR Negative 10/10/2014 2137   KETONESUR NEGATIVE 04/06/2021 0404   PROTEINUR NEGATIVE 04/06/2021 0404   UROBILINOGEN 0.2 05/17/2016 1408   NITRITE NEGATIVE 04/06/2021 0404   LEUKOCYTESUR NEGATIVE 04/06/2021 0404   LEUKOCYTESUR Negative 10/10/2014 2137   Sepsis Labs Invalid input(s): PROCALCITONIN,  WBC,  LACTICIDVEN Microbiology Recent Results (from the past 240 hour(s))  Blood culture (routine x 2)     Status: None (Preliminary result)   Collection Time: 04/05/21  5:36 PM   Specimen: BLOOD  Result Value Ref Range Status   Specimen Description BLOOD LEFT ANTECUBITAL  Final   Special Requests   Final    BOTTLES DRAWN AEROBIC AND ANAEROBIC Blood Culture adequate volume   Culture   Final    NO GROWTH 4 DAYS Performed at [facility], [address], [place], [place] [address]    Report Status  PENDING  Incomplete  Resp Panel by RT-PCR (Flu A&B, Covid) Nasopharyngeal Swab     Status: None   Collection Time: 04/05/21  5:38 PM   Specimen: Nasopharyngeal Swab; Nasopharyngeal(NP) swabs in vial transport medium  Result Value Ref Range Status   SARS Coronavirus 2 by RT PCR NEGATIVE NEGATIVE Final    Comment: (NOTE) SARS-CoV-2 target nucleic acids are NOT DETECTED.  The SARS-CoV-2 RNA is generally detectable in upper respiratory specimens during the acute phase of infection. The lowest concentration of SARS-CoV-2 viral copies this assay can detect is 138 copies/mL. A negative result does not preclude SARS-Cov-2 infection and should not be used as the sole basis for treatment or other patient management decisions. A negative result may occur with  improper specimen collection/handling, submission of specimen other than nasopharyngeal swab, presence of viral mutation(s) within the areas targeted by this assay, and inadequate number of viral copies(<138 copies/mL). A negative result must be combined with clinical observations, patient history, and epidemiological information. The expected result is Negative.  Fact Sheet for Patients:  EntrepreneurPulse.com.au  Fact Sheet for Healthcare Providers:  IncredibleEmployment.be  This test is no t yet approved or cleared by the [place] FDA and  has been authorized for detection and/or diagnosis of SARS-CoV-2 by FDA under an Emergency Use Authorization (EUA). This EUA will remain  in effect (meaning this test can be used) for the duration of the COVID-19 declaration under Section 564(b)(1) of the Act, 21 U.S.C.section 360bbb-3(b)(1), unless the authorization is terminated  or revoked sooner.       Influenza A by PCR NEGATIVE NEGATIVE Final   Influenza B by PCR NEGATIVE NEGATIVE Final    Comment: (NOTE) The Xpert Xpress SARS-CoV-2/FLU/RSV plus assay is intended as an aid in the diagnosis  of  influenza from Nasopharyngeal swab specimens and should not be used as a sole basis for treatment. Nasal washings and aspirates are unacceptable for Xpert Xpress SARS-CoV-2/FLU/RSV testing.  Fact Sheet for Patients: EntrepreneurPulse.com.au  Fact Sheet for Healthcare Providers: IncredibleEmployment.be  This test is not yet approved or cleared by the [place] FDA and has been authorized for detection and/or diagnosis of SARS-CoV-2 by FDA under an Emergency Use Authorization (EUA). This EUA will remain in effect (meaning this test can be used) for the duration of the COVID-19 declaration under Section 564(b)(1) of the Act, 21 U.S.C. section 360bbb-3(b)(1), unless the authorization is terminated or revoked.  Performed at [facility], [address] [place], [place] [address]   Blood culture (routine x 2)     Status: None (Preliminary result)   Collection Time: 04/05/21  7:04 PM   Specimen: BLOOD  Result Value Ref Range Status   Specimen Description BLOOD BLOOD LEFT FOREARM  Final   Special Requests   Final    BOTTLES DRAWN AEROBIC AND ANAEROBIC Blood Culture adequate volume   Culture   Final    NO GROWTH 4 DAYS Performed at [facility], [address], [place], [place] [address]    Report Status PENDING  Incomplete  Urine Culture     Status: None   Collection Time: 04/06/21  4:04 AM   Specimen: Urine, Random  Result Value Ref Range Status   Specimen Description   Final    URINE, RANDOM Performed at [facility], [address], [place], [place] [address]    Special Requests   Final    NONE Performed at [facility], [address], [place], [place] [address]    Culture   Final    NO GROWTH Performed at [facility], [place] [address], [place], [place] [address]    Report Status 04/07/2021 FINAL  Final  MRSA Next Gen by PCR, Nasal     Status: None   Collection Time: 04/06/21  4:04 AM    Specimen: Nasal Mucosa; Nasal Swab  Result Value Ref Range Status   MRSA by PCR Next Gen NOT DETECTED NOT DETECTED Final    Comment: (NOTE) The GeneXpert MRSA Assay (FDA approved for NASAL specimens only), is one component of a comprehensive MRSA colonization surveillance program. It is not intended to diagnose MRSA infection nor to guide or monitor treatment for MRSA infections. Test performance is not FDA approved in patients less than 72 years old. Performed at [facility], [address] [place],  [address]      Total time spend on discharging this patient, including the last patient exam, discussing the hospital stay, instructions for ongoing care as it relates to all pertinent caregivers, as well as preparing the medical discharge records, prescriptions, and/or referrals as applicable, is 40 minutes.    Enzo Bi, MD  [facility] 04/09/2021, 11:41 AM

## 2021-04-09 NOTE — Progress Notes (Signed)
Hematology/Oncology Progress Note [facility] Telephone:([phone] [phone]  Patient Care Team: [name], FNP as PCP - General (Family Medicine) Minna Merritts, MD as PCP - Cardiology (Cardiology) Charletta Cousin, MD (Inactive) [name], [name], MD as Consulting Physician (Cardiology) Rico Junker, RN as Oncology Nurse Navigator   Name of the patient: Amber Barnes  [id number]  [birth year]  Date of visit: 04/09/21   INTERVAL HISTORY-  Patient reports feeling better today.  Nausea and vomiting symptoms are resolved.  No diarrhea.     Review of systems- Review of Systems  Constitutional:  Positive for appetite change and fatigue. Negative for chills and fever.  HENT:   Negative for hearing loss and voice change.   Eyes:  Negative for eye problems.  Respiratory:  Negative for chest tightness and cough.   Cardiovascular:  Negative for chest pain.  Gastrointestinal:  Negative for abdominal distention, abdominal pain, blood in stool, nausea and vomiting.  Endocrine: Negative for hot flashes.  Genitourinary:  Negative for difficulty urinating and frequency.   Musculoskeletal:  Negative for arthralgias.  Skin:  Negative for itching and rash.  Neurological:  Negative for extremity weakness.  Hematological:  Negative for adenopathy.  Psychiatric/Behavioral:  Negative for confusion.    Allergies  Allergen Reactions   Prednisone Other (See Comments)    Agitation, hyperactivity, polyphagia   Amiodarone Nausea And Vomiting   Hydrocodone-Acetaminophen Other (See Comments)    Hallucinations and combative   Oxycodone Itching    Can take it with benadryl   Tapentadol Itching   Tramadol Itching    Can take it with benadryl   Adhesive [Tape] Itching and Rash    Prefers paper tape   Latex Itching and Rash    use paper tap    Patient Active Problem List   Diagnosis Date Noted   Intractable vomiting    Diarrhea    Neutropenic  fever ([place]) 04/05/2021   Encounter for antineoplastic chemotherapy 03/24/2021   Neuropathy 03/24/2021   Port-A-Cath in place 03/24/2021   HER2-positive carcinoma of breast ([place]) 01/26/2021   Goals of care, counseling/discussion 01/26/2021   Malignant neoplasm of upper-outer quadrant of right female breast ([place]) 01/19/2021   Left arm pain 12/02/2020   Soft tissue mass 12/02/2020   Candidal intertrigo 09/09/2020   Urinary frequency 09/09/2020   Night sweat 03/31/2020   Nasal drainage 12/18/2019   Sinus congestion 12/18/2019   Nausea 10/30/2019   Major depression in remission ([place]) 08/27/2018   Primary osteoarthritis involving multiple joints 08/27/2018   Atrial fibrillation ([place]) 08/27/2018   COPD (chronic obstructive pulmonary disease) ([place]) 08/27/2018   BMI 50.0-59.9, adult ([place]) 07/16/2018   Morbid obesity with BMI of 50.0-59.9, adult ([place]) 05/31/2018   Bradycardia 05/22/2017   Restless leg syndrome 04/13/2017   OSA (obstructive sleep apnea) 01/09/2017   Fatty liver disease, nonalcoholic 05/69/7948   Family history of ovarian cancer 06/08/2016   GERD (gastroesophageal reflux disease) 05/05/2016   Hypertension 11/11/2014   Anxiety and depression 11/11/2014   Weight loss 11/11/2014     Past Medical History:  Diagnosis Date   Achilles tendinitis    Acute medial meniscal tear    Allergy    Anxiety    Atrial fibrillation ([place])    a. new onset 07/2016; b. CHADS2VASc --> 4 (HTN, female, age, CHF); c. eliquis   CHF (congestive heart failure) (HCC)    Chronic anticoagulation    Apixaban   Chronic lower back pain  Colon polyp    Complication of anesthesia    Emergence delirium; hypoxia   COPD (chronic obstructive pulmonary disease) (HCC)    Cystic breast    Depression    Endometriosis    Fibrocystic breast disease    GERD (gastroesophageal reflux disease)    History of stress test    a. 07/2016 MV: EF 55-65%. Small, mild defect  in mid anteroseptal, apical septal, and  apical locations. No ischemia-->low risk.   Hyperlipidemia    Hypertension    Insomnia    Lipoma    Malignant neoplasm of upper-outer quadrant of right female breast (HCC)    Stage 1A invasive mammary carcinoma (cT1b or T1c N0); ER (-), PR (+), HER2/neu (+)   Migraine    Mild Mitral regurgitation    a. 07/2016 Echo: EF 55-65%, no rwma, mild MR. Mildly dil LA. Nl RV fx.    OSA (obstructive sleep apnea)    non-compliant with nocturnal PAP therapy   Osteoarthritis    left knee   Osteopenia    Peptic ulcer disease    a. H. pylori   Pneumonia    RLS (restless legs syndrome)    Tendonitis    Thyroid disease    Vitamin D deficiency      Past Surgical History:  Procedure Laterality Date   ABDOMINAL HYSTERECTOMY     ovaries left   ACHILLES TENDON SURGERY     2012,01/2017   BACK SURGERY     fusion lumbar   BREAST BIOPSY Right 01/15/2021   Affirm biopsy-"X" clip-INVASIVE MAMMARY CARCINOMA   BREAST BIOPSY Right 01/15/2021   u/s bx-"venus" clip INVASIVE MAMMARY CARCINOMA   BREAST BIOPSY     BREAST LUMPECTOMY,RADIO FREQ LOCALIZER,AXILLARY SENTINEL LYMPH NODE BIOPSY Right 02/17/2021   Procedure: BREAST LUMPECTOMY,RADIO FREQ LOCALIZER,AXILLARY SENTINEL LYMPH NODE BIOPSY;  Surgeon: Ronny Bacon, MD;  Location: [facility];  Service: General;  Laterality: Right;   CARDIOVERSION N/A 09/07/2016   Procedure: CARDIOVERSION;  Surgeon: Minna Merritts, MD;  Location: [facility];  Service: Cardiovascular;  Laterality: N/A;   CARDIOVERSION N/A 10/19/2016   Procedure: CARDIOVERSION;  Surgeon: Minna Merritts, MD;  Location: [facility];  Service: Cardiovascular;  Laterality: N/A;   CARDIOVERSION N/A 12/14/2016   Procedure: CARDIOVERSION;  Surgeon: Minna Merritts, MD;  Location: [facility];  Service: Cardiovascular;  Laterality: N/A;   COLONOSCOPY     COLONOSCOPY WITH PROPOFOL N/A 05/13/2020   Procedure: COLONOSCOPY WITH PROPOFOL;  Surgeon: Toledo, Benay Pike, MD;  Location: [facility];  Service:  Gastroenterology;  Laterality: N/A;  ELIQUIS   ESOPHAGOGASTRODUODENOSCOPY     ESOPHAGOGASTRODUODENOSCOPY (EGD) WITH PROPOFOL N/A 05/13/2020   Procedure: ESOPHAGOGASTRODUODENOSCOPY (EGD) WITH PROPOFOL;  Surgeon: Toledo, Benay Pike, MD;  Location: [facility];  Service: Gastroenterology;  Laterality: N/A;   FOOT SURGERY Left    tendon   JOINT REPLACEMENT Right    hip   PORTA CATH INSERTION N/A 03/23/2021   Procedure: PORTA CATH INSERTION;  Surgeon: Katha Cabal, MD;  Location: [facility];  Service: Cardiovascular;  Laterality: N/A;   PORTACATH PLACEMENT N/A 03/23/2021   Procedure: ATTEMPTED INSERTION PORT-A-CATH;  Surgeon: Ronny Bacon, MD;  Location: [facility];  Service: General;  Laterality: N/A;   THYROIDECTOMY     thyroid lobectomy secondary to a benign nodule   TOTAL HIP ARTHROPLASTY Right 08/21/2018   Procedure: TOTAL HIP ARTHROPLASTY ANTERIOR APPROACH-RIGHT CELL SAVER REQUESTED;  Surgeon: [name], MD;  Location: [facility];  Service: Orthopedics;  Laterality: Right;  Social History   Socioeconomic History   Marital status: Married    Spouse name: jeffrey   Number of children: 1   Years of education: Not on file   Highest education level: High school graduate  Occupational History    Comment: disabled  Tobacco Use   Smoking status: Former    Packs/day: 0.25    Years: 10.00    Pack years: 2.50    Types: Cigarettes    Quit date: 2005    Years since quitting: 17.7   Smokeless tobacco: Never   Tobacco comments:    smoked for 10 years, 1 pack every 2-3 days  Vaping Use   Vaping Use: Never used  Substance and Sexual Activity   Alcohol use: Not Currently    Alcohol/week: 1.0 standard drink    Types: 1 Glasses of wine per week    Comment: rarely   Drug use: No   Sexual activity: Not Currently  Other Topics Concern   Not on file  Social History Narrative   Moved to [place] from [place], originally from [place].    1 cup of  caffeine daily   Right handed          Social Determinants of Health   Financial Resource Strain: Low Risk    Difficulty of Paying Living Expenses: Not very hard  Food Insecurity: No Food Insecurity   Worried About Charity fundraiser in the Last Year: Never true   Ran Out of Food in the Last Year: Never true  Transportation Needs: No Transportation Needs   Lack of Transportation (Medical): No   Lack of Transportation (Non-Medical): No  Physical Activity: Unknown   Days of Exercise per Week: 0 days   Minutes of Exercise per Session: Not on file  Stress: Stress Concern Present   Feeling of Stress : Rather much  Social Connections: Moderately Isolated   Frequency of Communication with Friends and Family: Never   Frequency of Social Gatherings with Friends and Family: Once a week   Attends Religious Services: More than 4 times per year   Active Member of [employer] or Organizations: No   Attends Music therapist: Never   Marital Status: Married  Human resources officer Violence: Not At Risk   Fear of Current or Ex-Partner: No   Emotionally Abused: No   Physically Abused: No   Sexually Abused: No     Family History  Problem Relation Age of Onset   Cancer Sister 63       ovarian   Depression Sister    Cancer Brother        CLL   Cancer Father 4       colon   Heart disease Father    Heart attack Father 15       died from MI   Hyperlipidemia Father    Hypertension Father    Cancer Mother        leukemia   Breast cancer Paternal Aunt        84's   Migraines Neg Hx      Current Facility-Administered Medications:    acetaminophen (TYLENOL) tablet 650 mg, 650 mg, Oral, Q6H PRN, 650 mg at 04/08/21 0129 **OR** acetaminophen (TYLENOL) suppository 650 mg, 650 mg, Rectal, Q6H PRN, Mans[place], Jan A, MD   albuterol (PROVENTIL) (2.5 MG/3ML) 0.083% nebulizer solution 2.5 mg, 2.5 mg, Inhalation, Q8H PRN, Mansy, Jan A, MD   apixaban (ELIQUIS) tablet 5 mg, 5 mg, Oral, BID, Mansy,  Jan A, MD, 5 mg at 04/09/21 6789   azelastine (ASTELIN) 0.1 % nasal spray 2 spray, 2 spray, Each Nare, PRN, Mansy, Arvella Merles, MD   B-complex with vitamin C tablet, , Oral, Daily, Mansy, Jan A, MD, 1 tablet at 04/07/21 0846   bisoprolol (ZEBETA) tablet 5 mg, 5 mg, Oral, BID, Visser, Jacquelyn D, PA-C, 5 mg at 04/09/21 0910   busPIRone (BUSPAR) tablet 5 mg, 5 mg, Oral, TID PRN, Mansy, Jan A, MD, 5 mg at 04/09/21 0909   calcium-vitamin D (OSCAL WITH D) 500-200 MG-UNIT per tablet 1 tablet, 1 tablet, Oral, Q breakfast, Mansy, Jan A, MD, 1 tablet at 04/07/21 0845   Chlorhexidine Gluconate Cloth 2 % PADS 6 each, 6 each, Topical, Daily, Enzo Bi, MD, 6 each at 04/09/21 0910   cholecalciferol (VITAMIN D3) tablet 5,000 Units, 5,000 Units, Oral, Daily, Mansy, Jan A, MD, 5,000 Units at 04/07/21 0845   DULoxetine (CYMBALTA) DR capsule 30 mg, 30 mg, Oral, BID, Mansy, Jan A, MD, 30 mg at 04/09/21 3810   furosemide (LASIX) tablet 20 mg, 20 mg, Oral, BID, Mansy, Jan A, MD, 20 mg at 04/09/21 1751   LORazepam (ATIVAN) tablet 0.5 mg, 0.5 mg, Oral, Q6H PRN, [name], MD, 0.5 mg at 04/08/21 2255   [START ON 04/10/2021] losartan (COZAAR) tablet 12.5 mg, 12.5 mg, Oral, Daily, Visser, Jacquelyn D, PA-C   magnesium hydroxide (MILK OF MAGNESIA) suspension 30 mL, 30 mL, Oral, Daily PRN, Mansy, Jan A, MD   magnesium sulfate IVPB 4 g 100 mL, 4 g, Intravenous, Once, Enzo Bi, MD, Last Rate: 50 mL/hr at 04/09/21 1225, 4 g at 04/09/21 1225   multivitamin with minerals tablet 1 tablet, 1 tablet, Oral, Daily, Mansy, Jan A, MD, 1 tablet at 04/07/21 0844   ondansetron (ZOFRAN) tablet 4 mg, 4 mg, Oral, Q6H PRN, 4 mg at 04/07/21 1943 **OR** ondansetron (ZOFRAN) injection 4 mg, 4 mg, Intravenous, Q6H PRN, Mansy, Jan A, MD, 4 mg at 04/09/21 0749   pantoprazole (PROTONIX) EC tablet 40 mg, 40 mg, Oral, Daily, Mansy, Jan A, MD, 40 mg at 04/09/21 0258   potassium chloride SA (KLOR-CON) CR tablet 20 mEq, 20 mEq, Oral, BID, Mansy, Jan A, MD, 20 mEq  at 04/09/21 5277   potassium chloride SA (KLOR-CON) CR tablet 20 mEq, 20 mEq, Oral, Once, Visser, Jacquelyn D, PA-C   prochlorperazine (COMPAZINE) tablet 10 mg, 10 mg, Oral, Q6H PRN, Mansy, Jan A, MD   promethazine (PHENERGAN) tablet 25 mg, 25 mg, Oral, Q6H PRN, Mansy, Jan A, MD, 25 mg at 04/08/21 0559   ranolazine (RANEXA) 12 hr tablet 500 mg, 500 mg, Oral, BID, Mansy, Jan A, MD, 500 mg at 04/09/21 8242   rOPINIRole (REQUIP) tablet 1 mg, 1 mg, Oral, QHS, Mansy, Jan A, MD, 1 mg at 04/08/21 2134   rosuvastatin (CRESTOR) tablet 20 mg, 20 mg, Oral, Daily, Mansy, Jan A, MD, 20 mg at 04/09/21 3536   sodium chloride flush (NS) 0.9 % injection 10-40 mL, 10-40 mL, Intracatheter, Q12H, Enzo Bi, MD, 10 mL at 04/09/21 0911   sodium chloride flush (NS) 0.9 % injection 10-40 mL, 10-40 mL, Intracatheter, PRN, Enzo Bi, MD   SUMAtriptan (IMITREX) tablet 50 mg, 50 mg, Oral, Q2H PRN, Mansy, Jan A, MD   traZODone (DESYREL) tablet 25 mg, 25 mg, Oral, QHS PRN, Mansy, Jan A, MD, 25 mg at 04/06/21 2202   Physical exam:  Vitals:   04/09/21 0003 04/09/21 0506 04/09/21 0700 04/09/21 1120  BP: 125/72  112/65 110/68 125/73  Pulse: 75 69 70 74  Resp: 18 18 17 17   Temp:  98 F (36.7 C) 98.1 F (36.7 C) 98.2 F (36.8 C)  TempSrc:  Oral Oral Oral  SpO2: 97% 96% 96%   Weight:      Height:       Physical Exam Constitutional:      General: She is not in acute distress.    Appearance: She is obese. She is not diaphoretic.  HENT:     Head: Normocephalic and atraumatic.     Nose: Nose normal.     Mouth/Throat:     Pharynx: No oropharyngeal exudate.  Eyes:     General: No scleral icterus.    Pupils: Pupils are equal, round, and reactive to light.  Cardiovascular:     Rate and Rhythm: Normal rate and regular rhythm.     Heart sounds: No murmur heard. Pulmonary:     Effort: Pulmonary effort is normal. No respiratory distress.     Breath sounds: No rales.  Chest:     Chest wall: No tenderness.  Abdominal:      General: There is no distension.     Palpations: Abdomen is soft.     Tenderness: There is no abdominal tenderness.  Musculoskeletal:        General: Normal range of motion.     Cervical back: Normal range of motion and neck supple.  Skin:    General: Skin is warm and dry.     Findings: No erythema.  Neurological:     Mental Status: She is alert and oriented to person, place, and time.     Cranial Nerves: No cranial nerve deficit.     Motor: No abnormal muscle tone.     Coordination: Coordination normal.  Psychiatric:        Mood and Affect: Affect normal.       CMP Latest Ref Rng & Units 04/09/2021  Glucose 70 - 99 mg/dL 88  BUN 8 - 23 mg/dL 7(L)  Creatinine 0.44 - 1.00 mg/dL 0.88  Sodium 135 - 145 mmol/L 137  Potassium 3.5 - 5.1 mmol/L 3.6  Chloride 98 - 111 mmol/L 107  CO2 22 - 32 mmol/L 24  Calcium 8.9 - 10.3 mg/dL 7.6(L)  Total Protein 6.5 - 8.1 g/dL -  Total Bilirubin 0.3 - 1.2 mg/dL -  Alkaline Phos 38 - 126 U/L -  AST 15 - 41 U/L -  ALT 0 - 44 U/L -   CBC Latest Ref Rng & Units 04/09/2021  WBC 4.0 - 10.5 K/uL 5.7  Hemoglobin 12.0 - 15.0 g/dL 10.0(L)  Hematocrit 36.0 - 46.0 % 28.4(L)  Platelets 150 - 400 K/uL 143(L)    RADIOGRAPHIC STUDIES: I have personally reviewed the radiological images as listed and agreed with the findings in the report. CT HEAD WO CONTRAST (5MM)  Result Date: 04/05/2021 CLINICAL DATA:  Dizziness nausea vomiting EXAM: CT HEAD WITHOUT CONTRAST TECHNIQUE: Contiguous axial images were obtained from the base of the skull through the vertex without intravenous contrast. COMPARISON:  CT brain 04/17/2018 FINDINGS: Brain: No evidence of acute infarction, hemorrhage, hydrocephalus, extra-axial collection or mass lesion/mass effect. Vascular: No hyperdense vessels. Mild carotid vascular calcification Skull: Normal. Negative for fracture or focal lesion. Sinuses/Orbits: No acute finding. Other: None IMPRESSION: Negative non contrasted CT appearance  of the brain Electronically Signed   By: Donavan Foil M.D.   On: 04/05/2021 18:33   CT ABDOMEN PELVIS W CONTRAST  Result  Date: 04/05/2021 CLINICAL DATA:  C/O nausea, vomiting, nose bleeds since Saturday. Last chemo Wednesday. EXAM: CT ABDOMEN AND PELVIS WITH CONTRAST TECHNIQUE: Multidetector CT imaging of the abdomen and pelvis was performed using the standard protocol following bolus administration of intravenous contrast. CONTRAST:  136m OMNIPAQUE IOHEXOL 350 MG/ML SOLN COMPARISON:  CT abdomen pelvis 11/05/2019 FINDINGS: Lower chest: No acute abnormality. Hepatobiliary: No focal liver abnormality. No gallstones, gallbladder wall thickening, or pericholecystic fluid. No biliary dilatation. Pancreas: No focal lesion. Normal pancreatic contour. No surrounding inflammatory changes. No main pancreatic ductal dilatation. Spleen: Normal in size without focal abnormality. Adrenals/Urinary Tract: No adrenal nodule bilaterally. Bilateral kidneys enhance symmetrically. There is a 2 cm fluid density lesion within the left kidney that likely represents a simple renal cyst. Subcentimeter hypodensities are too small to characterize. Punctate calcification within the right kidney. No hydronephrosis. No hydroureter. The urinary bladder is unremarkable. On delayed imaging, there is no urothelial wall thickening and there are no filling defects in the opacified portions of the bilateral collecting systems or ureters. Stomach/Bowel: Stomach is within normal limits. No evidence of bowel wall thickening or dilatation. Appendix appears normal. Vascular/Lymphatic: No abdominal aorta or iliac aneurysm. Severe atherosclerotic plaque of the aorta and its branches. No abdominal, pelvic, or inguinal lymphadenopathy. Reproductive: Not visualized due to streak artifact originating from the right femoral surgical hardware. Other: No intraperitoneal free fluid. No intraperitoneal free gas. No organized fluid collection. Musculoskeletal: No  abdominal wall hernia or abnormality. No suspicious lytic or blastic osseous lesions. No acute displaced fracture. Multilevel degenerative changes of the spine. Total right hip arthroplasty. IMPRESSION: 1. No acute intra-abdominal or intrapelvic abnormality. 2.  Aortic Atherosclerosis (ICD10-I70.0). Electronically Signed   By: MIven FinnM.D.   On: 04/05/2021 20:16   PERIPHERAL VASCULAR CATHETERIZATION  Result Date: 03/23/2021 See surgical note for result.  DG Chest Portable 1 View  Result Date: 04/05/2021 CLINICAL DATA:  Shortness of breath EXAM: PORTABLE CHEST 1 VIEW COMPARISON:  03/23/2021 FINDINGS: Left-sided central venous port with tip over the SVC. Cardiomegaly with vascular congestion. No consolidation, pleural effusion, or pneumothorax IMPRESSION: Cardiomegaly with vascular congestion Electronically Signed   By: KDonavan FoilM.D.   On: 04/05/2021 18:31   DG Chest Port 1 View  Result Date: 03/23/2021 CLINICAL DATA:  Pneumothorax EXAM: PORTABLE CHEST 1 VIEW COMPARISON:  11/05/2019 FINDINGS: Lung volumes are small but are symmetric and are clear. No pneumothorax or pleural effusion. Mild cardiomegaly is stable. Pulmonary vascularity is normal. IMPRESSION: No pneumothorax. Stable cardiomegaly Electronically Signed   By: AFidela SalisburyM.D.   On: 03/23/2021 14:35    Assessment and plan-   Chemotherapy-induced nausea vomiting Continue antiemetics as needed.  Symptoms are better. Patient has Zofran, Compazine, Phenergan at home, recommend patient to continue use as instructed after discharge.  She may also use Ativan 0.5 mg as needed for anxiety induced nausea.  Chemotherapy-induced neutropenia fever, patient is afebrile. ANC is 1.2 today.  Infectious etiology work-up is negative.  Stop antibiotics.  Breast cancer on adjuvant chemotherapy. Hold chemo this week and next week to allow her recover.  She will follow up with me 2 weeks.   Hypocalcemia, I asked patient to resume her home  dose of calcium supplementation 1200 mg daily.  She is asymptomatic currently.   Thank you for allowing me to participate in the care of this patient.  Discussed with Dr. LBillie Ruddy  Okay to discharge home today from hematology oncology aspect.  ZEarlie Server MD, PhD  04/09/2021

## 2021-04-09 NOTE — Progress Notes (Signed)
Physical Therapy Treatment Patient Details Name: Amber Barnes MRN: [id number] DOB: Mar 05, 1951 Today's Date: 04/09/2021   History of Present Illness Pt is a 70 y.o. female with medical history that includes breast cancer currently undergoing active chemotherapy, CHF, LBP, COPD, HTN, and R THA.  Pt presented to the emergency room with acute onset of nausea and vomiting, diarrhea, and abdominal pain with low-grade fever. MD assessment includes: Neutropenic fever with associated SIRS, chemo-induced refractory nausea, diarrhea, and A-fib.    PT Comments    Pt just returned to bed from trip to bathroom with nurse tech.  Fatigued but willing to try supine ex.  AROM BLE with good reall of HEP from prior admission.  Encouraged continued ex per tolerance and discussed home safety/mobility.  Declined further gait at this time   Recommendations for follow up therapy are one component of a multi-disciplinary discharge planning process, led by the attending physician.  Recommendations may be updated based on patient status, additional functional criteria and insurance authorization.  Follow Up Recommendations  Home health PT;Supervision - Intermittent     Equipment Recommendations  None recommended by PT    Recommendations for Other Services       Precautions / Restrictions Precautions Precautions: None Restrictions Weight Bearing Restrictions: No Other Position/Activity Restrictions: Port in LUQ     Mobility  Bed Mobility                    Transfers                    Ambulation/Gait                 Stairs             Wheelchair Mobility    Modified Rankin (Stroke Patients Only)       Balance                                            Cognition Arousal/Alertness: Awake/alert Behavior During Therapy: WFL for tasks assessed/performed Overall Cognitive Status: Within Functional Limits for tasks assessed                                         Exercises Other Exercises Other Exercises: BLE HEP review    General Comments        Pertinent Vitals/Pain Pain Assessment: Faces Faces Pain Scale: Hurts a little bit Pain Location: LUQ port site Pain Descriptors / Indicators: Sore Pain Intervention(s): Limited activity within patient's tolerance;Monitored during session    Home Living                      Prior Function            PT Goals (current goals can now be found in the care plan section) Progress towards PT goals: Progressing toward goals    Frequency    Min 2X/week      PT Plan      Co-evaluation              AM-PAC PT "6 Clicks" Mobility   Outcome Measure  Help needed turning from your back to your side while in a flat bed without using bedrails?: None Help needed moving from lying on your back  to sitting on the side of a flat bed without using bedrails?: None Help needed moving to and from a bed to a chair (including a wheelchair)?: None Help needed standing up from a chair using your arms (e.g., wheelchair or bedside chair)?: None Help needed to walk in hospital room?: A Little Help needed climbing 3-5 steps with a railing? : A Little 6 Click Score: 22    End of Session   Activity Tolerance: Patient tolerated treatment well Patient left: in bed;with call bell/phone within reach;with bed alarm set;Other (comment) (Pt declined up in chair) Nurse Communication: Mobility status PT Visit Diagnosis: History of falling (Z91.81);Difficulty in walking, not elsewhere classified (R26.2)     Time: 9485-4627 PT Time Calculation (min) (ACUTE ONLY): 9 min  Charges:  $Therapeutic Exercise: 8-22 mins                    Chesley Noon, PTA 04/09/21, 11:47 AM

## 2021-04-09 NOTE — Progress Notes (Addendum)
Progress Note  Patient Name: Amber Barnes Date of Encounter: 04/09/2021  Primary Cardiologist: Ida Rogue, MD   Subjective   Reports improvement in breathing status and overall sx since converting to NSR last night.  No further orthopnea.  No further chest pain or pressure.  She was able to eat a bagel and some corn flakes without emesis and reports overall improvement.  Inpatient Medications    Scheduled Meds:  apixaban  5 mg Oral BID   B-complex with vitamin C   Oral Daily   bisoprolol  5 mg Oral BID   calcium-vitamin D  1 tablet Oral Q breakfast   Chlorhexidine Gluconate Cloth  6 each Topical Daily   cholecalciferol  5,000 Units Oral Daily   DULoxetine  30 mg Oral BID   furosemide  20 mg Oral BID   multivitamin with minerals  1 tablet Oral Daily   pantoprazole  40 mg Oral Daily   potassium chloride SA  20 mEq Oral BID   ranolazine  500 mg Oral BID   rOPINIRole  1 mg Oral QHS   rosuvastatin  20 mg Oral Daily   sodium chloride flush  10-40 mL Intracatheter Q12H   Continuous Infusions:  ceFEPime (MAXIPIME) IV Stopped (04/09/21 0532)   diltiazem (CARDIZEM) infusion Stopped (04/09/21 0500)   vancomycin 1,750 mg (04/08/21 1840)   PRN Meds: acetaminophen **OR** acetaminophen, albuterol, azelastine, busPIRone, LORazepam, magnesium hydroxide, ondansetron **OR** ondansetron (ZOFRAN) IV, prochlorperazine, promethazine, sodium chloride flush, SUMAtriptan, traZODone   Vital Signs    Vitals:   04/08/21 1557 04/08/21 2023 04/09/21 0003 04/09/21 0506  BP: 108/79 113/65 125/72 112/65  Pulse: (!) 102 73 75 69  Resp: 18 17 18 18   Temp: 97.7 F (36.5 C) 98.4 F (36.9 C)  98 F (36.7 C)  TempSrc: Oral   Oral  SpO2: 98% 98% 97% 96%  Weight:      Height:        Intake/Output Summary (Last 24 hours) at 04/09/2021 0828 Last data filed at 04/09/2021 0532 Gross per 24 hour  Intake 810.99 ml  Output 700 ml  Net 110.99 ml   Last 3 Weights 04/07/2021 03/31/2021 03/24/2021   Weight (lbs) 286 lb 9.6 oz 277 lb 284 lb  Weight (kg) 130 kg 125.646 kg 128.822 kg  Some encounter information is confidential and restricted. Go to Review Flowsheets activity to see all data.      Telemetry    Earlier atrial fibrillation - converted to NSR at approximately 19:18 on 04/08/2021 and rates in NSR have been 60- 70s - Personally Reviewed  ECG    No new tracings- Personally Reviewed  Physical Exam   GEN: Obese female, no acute distress.   Neck: JVD difficult to assess due to body habitus Cardiac: RRR, no murmurs, rubs, or gallops.  Chemotherapy port located left upper chest. Respiratory: Clear to auscultation bilaterally. GI: Obese, soft, nontender, non-distended  MS: No pitting bilateral edema; No deformity. Neuro:  Nonfocal  Psych: Normal affect   Labs    High Sensitivity Troponin:   Recent Labs  Lab 04/05/21 1738 04/05/21 2027  TROPONINIHS <2 <2      Chemistry Recent Labs  Lab 04/05/21 1738 04/06/21 0625 04/07/21 0410 04/08/21 0539 04/09/21 0440  NA 136   < > 139 137 137  K 3.8   < > 3.2* 3.6 3.6  CL 103   < > 106 106 107  CO2 25   < > 25 25 24   GLUCOSE 117*   < >  102* 94 88  BUN 17   < > 10 7* 7*  CREATININE 0.98   < > 0.76 0.70 0.88  CALCIUM 8.3*   < > 8.3* 7.9* 7.6*  PROT 6.9  --   --   --   --   ALBUMIN 3.7  --   --   --   --   AST 20  --   --   --   --   ALT 22  --   --   --   --   ALKPHOS 69  --   --   --   --   BILITOT 1.4*  --   --   --   --   GFRNONAA >60   < > >60 >60 >60  ANIONGAP 8   < > 8 6 6    < > = values in this interval not displayed.     Hematology Recent Labs  Lab 04/07/21 0410 04/08/21 0539 04/09/21 0440  WBC 1.7* 2.3* 5.7  RBC 3.61* 3.23* 3.14*  HGB 11.2* 10.2* 10.0*  HCT 32.5* 29.1* 28.4*  MCV 90.0 90.1 90.4  MCH 31.0 31.6 31.8  MCHC 34.5 35.1 35.2  RDW 12.2 12.3 12.7  PLT 122* 121* 143*    BNPNo results for input(s): BNP, PROBNP in the last 168 hours.   DDimer No results for input(s): DDIMER in the  last 168 hours.   Radiology    No results found.  Cardiac Studies   Echo 03/08/2021  1. Left ventricular ejection fraction, by estimation, is 60 to 65%. The  left ventricle has normal function. Left ventricular endocardial border  not optimally defined to evaluate regional wall motion. There is mild left  ventricular hypertrophy. Left  ventricular diastolic parameters are consistent with Grade I diastolic  dysfunction (impaired relaxation). The average left ventricular global  longitudinal strain is -16.3 %. The global longitudinal strain is normal.   2. Right ventricular systolic function is normal. The right ventricular  size is normal.   3. Left atrial size was mildly dilated.   4. The mitral valve is normal in structure. Trivial mitral valve  regurgitation. No evidence of mitral stenosis.   5. The aortic valve is normal in structure. Aortic valve regurgitation is  not visualized. No aortic stenosis is present.  Patient Profile     70 y.o. female with a hx of history of PAF, obesity, OA, hyperlipidemia, hypertension, OSA on CPAP, migraines, H. pylori/peptic ulcer disease, anxiety, and who is being seen today for the evaluation of Afib after recent chemotherapy and subsequent episode of emesis.  Assessment & Plan    Paroxysmal atrial fibrillation on anticoagulation -- Converted back to NSR on 9/29 at 19:18 with improvement in breathing after that time.  Presented to [facility] after chemotherapy and subsequent nausea/emesis, followed by recurrence of known atrial fibrillation, resulting in fatigue and SOB. Rate control with PTA bisoprolol 5 mg twice daily - added hold precautions for hypotension/bradycardic rates.   Discontinued IV diltiazem.  Consider transition from albuterol to Xopenex, given SE profile of Albuterol. Continue Eliquis 5 mg twice daily.   CHA2DS2VASc score of at least 4 (HFpEF, hypertension, age, female).   Of note, OAC interrupted for insertion of chemotherapy  port. No plan for DCCV. Back in NSR. Not therapeutically anticoagulated given [facility] held for insertion of chemotherapy port.   HFpEF -- Breathing improved with conversion back to NSR.  Continue PTA Lasix 66m BID with potassium supplementation.  Net -1.1L yesterday with  wt not well tracked.  Continue to monitor I/os, daily weights.  Losartan held earlier this admission to allow for escalation of rate control. Order for tentative restart of ARB at a reduced dose of Losartan 12.106m tomorrow if BP allows.  Do not recommend HCTZ (previously noted) as she is already on lasix, and multiple diuretics increase the risk of electrolyte abnormalities.  Scheduled for OP echo q343moiven chemotherapy tx.  Hypokalemia - Suspect hypokalemia 2/2 emesis and as she has been on 2 diuretics in the outpatient setting.  Do not recommend restart of HCTZ as above.  Most recent potassium 3.6.  Ordered additional KCL repletion today.  Replete with goal 4.0.  Check magnesium.  Maintain electrolytes at goal to prevent recurrent episodes of atrial fibrillation.   Essential hypertension --BP well controlled.  Losartan held in the setting of low BP. Ordered tentative restart at a reduced dose tomorrow if BP/ Cr allows.  Do not recommend restart of HCTZ as above.   HLD --Continue Crestor 20 mg daily.   Neutropenic fever /chemotherapy-induced gastritis/nausea -- Per IM/oncology.   HER2 positive carcinoma of the breast with recent initiation of chemotherapy -- Per oncology.  Echocardiograms to be ordered by oncology/ echo every 3 months in the setting of her chemotherapy.     Sleep apnea on CPAP --Recommend nightly CPAP.    For questions or updates, please contact [facility] consult [email or web address] for contact info under        Signed, JaArvil ChacoPA-C  04/09/2021, 8:28 AM

## 2021-04-09 NOTE — Progress Notes (Signed)
Discharge instructions explained to pt/verbalized understanding. IV and tele removed. Chest portacath, de accessed/band aid in place. Will transport off unti via wheelchair when ride arrives.

## 2021-04-09 NOTE — TOC Initial Note (Signed)
Transition of Care [facility]) - Initial/Assessment Note    Patient Details  Name: Amber Barnes MRN: [id number] Date of Birth: Nov 18, 1950  Transition of Care [facility]) CM/SW Contact:    Magnus Ivan, LCSW Phone Number: 04/09/2021, 1:03 PM  Clinical Narrative:                CSW spoke with patient's spouse Hartlee Amedee for high risk screening/to discuss PT recs. Patient lives with spouse and daughter. Family provides transport. PCP is [name], FNP. [facility]. Patient has a RW, rollator, cane, wheelchair, and shower chair at home. Spouse refuses HH at this time. No additional TOC needs identified at this time.    Expected Discharge Plan: [place] Barriers to Discharge: Barriers Resolved   Patient Goals and CMS Choice Patient states their goals for this hospitalization and ongoing recovery are:: home with family CMS Medicare.gov Compare Post Acute Care list provided to:: Patient Represenative (must comment) Choice offered to / list presented to : Spouse  Expected Discharge Plan and Services Expected Discharge Plan: [place]       Living arrangements for the past 2 months: Single Family Home Expected Discharge Date: 04/09/21                                    Prior Living Arrangements/Services Living arrangements for the past 2 months: Single Family Home Lives with:: Spouse, Adult Children Patient language and need for interpreter reviewed:: Yes Do you feel safe going back to the place where you live?: Yes      Need for Family Participation in Patient Care: Yes (Comment) Care giver support system in place?: Yes (comment) Current home services: DME Criminal Activity/Legal Involvement Pertinent to Current Situation/Hospitalization: No - Comment as needed  Activities of Daily Living Home Assistive Devices/Equipment: Gilford Rile (specify type) ADL Screening (condition at time of admission) Patient's cognitive ability  adequate to safely complete daily activities?: Yes Is the patient deaf or have difficulty hearing?: No Does the patient have difficulty seeing, even when wearing glasses/contacts?: No Does the patient have difficulty concentrating, remembering, or making decisions?: No Patient able to express need for assistance with ADLs?: Yes Does the patient have difficulty dressing or bathing?: No Independently performs ADLs?: Yes (appropriate for developmental age) Does the patient have difficulty walking or climbing stairs?: Yes Weakness of Legs: None Weakness of Arms/Hands: None  Permission Sought/Granted                  Emotional Assessment       Orientation: : Oriented to Situation, Oriented to  Time, Oriented to Place, Oriented to Self Alcohol / Substance Use: Not Applicable Psych Involvement: No (comment)  Admission diagnosis:  Anxiety [F41.9] Nausea [R11.0] Atrial fibrillation with rapid ventricular response (HCC) [I48.91] Neutropenic fever ([place]) [D70.9, R50.81] Neutropenia, unspecified type ([place]) [D70.9] Sepsis, due to unspecified organism, unspecified whether acute organ dysfunction present [facility]) [A41.9] Patient Active Problem List   Diagnosis Date Noted   Intractable vomiting    Diarrhea    Neutropenic fever ([place]) 04/05/2021   Encounter for antineoplastic chemotherapy 03/24/2021   Neuropathy 03/24/2021   Port-A-Cath in place 03/24/2021   HER2-positive carcinoma of breast ([place]) 01/26/2021   Goals of care, counseling/discussion 01/26/2021   Malignant neoplasm of upper-outer quadrant of right female breast ([place]) 01/19/2021   Left arm pain 12/02/2020   Soft tissue mass  12/02/2020   Candidal intertrigo 09/09/2020   Urinary frequency 09/09/2020   Night sweat 03/31/2020   Nasal drainage 12/18/2019   Sinus congestion 12/18/2019   Nausea 10/30/2019   Major depression in remission ([place]) 08/27/2018   Primary osteoarthritis involving multiple joints 08/27/2018   Atrial  fibrillation ([place]) 08/27/2018   COPD (chronic obstructive pulmonary disease) ([place]) 08/27/2018   BMI 50.0-59.9, adult ([place]) 07/16/2018   Morbid obesity with BMI of 50.0-59.9, adult ([place]) 05/31/2018   Bradycardia 05/22/2017   Restless leg syndrome 04/13/2017   OSA (obstructive sleep apnea) 01/09/2017   Fatty liver disease, nonalcoholic 06/77/0340   Family history of ovarian cancer 06/08/2016   GERD (gastroesophageal reflux disease) 05/05/2016   Hypertension 11/11/2014   Anxiety and depression 11/11/2014   Weight loss 11/11/2014   PCP:  [name], FNP [facility] [place] [place] [address] [place] [address] Phone: [phone] Fax: [phone]  [id number] [place], [place] - [address] AT [address] [place] [address] Phone: [phone] Fax: [phone]     Social Determinants of Health ([place]) Interventions    Readmission Risk Interventions Readmission Risk Prevention Plan 04/09/2021  Transportation Screening Complete  PCP or Specialist Appt within 3-5 Days Complete  HRI or [place] Complete  Social Work Consult for [place] Planning/Counseling Complete  Palliative Care Screening Not Applicable  Medication Review Press photographer) Complete  Some recent data might be hidden

## 2021-04-09 NOTE — Plan of Care (Signed)
  Problem: Safety: Goal: Ability to remain free from injury will improve Outcome: Progressing   Problem: Nutrition: Goal: Adequate nutrition will be maintained Outcome: Progressing   Problem: Clinical Measurements: Goal: Will remain free from infection Outcome: Progressing   Problem: Health Behavior/Discharge Planning: Goal: Ability to manage health-related needs will improve Outcome: Progressing   Problem: Education: Goal: Knowledge of General Education information will improve Description: Including pain rating scale, medication(s)/side effects and non-pharmacologic comfort measures Outcome: Progressing

## 2021-04-09 NOTE — Progress Notes (Addendum)
Pt converted to SR from AFIB rate of 5mg /hr cardizem drip HR 68-70. NP Randol Kern made aware. Will continue to monitor.  Update 0500: NP Randol Kern ordered to stopped cardizem drip at this time. Will continue to monitor.

## 2021-04-10 LAB — CULTURE, BLOOD (ROUTINE X 2)
Culture: NO GROWTH
Culture: NO GROWTH
Special Requests: ADEQUATE
Special Requests: ADEQUATE

## 2021-04-12 ENCOUNTER — Telehealth: Payer: Self-pay | Admitting: Oncology

## 2021-04-12 ENCOUNTER — Other Ambulatory Visit: Payer: Self-pay | Admitting: Family

## 2021-04-12 ENCOUNTER — Other Ambulatory Visit: Payer: Self-pay

## 2021-04-12 ENCOUNTER — Telehealth: Payer: Self-pay

## 2021-04-12 DIAGNOSIS — C50919 Malignant neoplasm of unspecified site of unspecified female breast: Secondary | ICD-10-CM

## 2021-04-12 DIAGNOSIS — F419 Anxiety disorder, unspecified: Secondary | ICD-10-CM

## 2021-04-12 NOTE — Progress Notes (Signed)
    Durable Medical Equipment  (From admission, onward)           Start     Ordered   04/12/21 0000  For home use only DME standard manual wheelchair with seat cushion       Comments: Patient suffers from Breast cancer which impairs their ability to perform daily activities like bathing, dressing, moving around in the home.  A walker will not resolve issue with performing activities of daily living. A wheelchair will allow patient to safely perform daily activities. Patient can safely propel the wheelchair in the home or has a caregiver who can provide assistance. Length of need is lifetime. Accessories: elevating leg rests (ELRs), wheel locks, extensions and anti-tippers.   04/12/21 1556

## 2021-04-12 NOTE — Telephone Encounter (Signed)
Order for wheelchair entered via Epic and progress note faxed to [employer] @ [phone]

## 2021-04-12 NOTE — Telephone Encounter (Signed)
I called pt to inform her of her appts on 10/17. She asked if she could get a prescription for a wheelchair do to "poor quality of living" and her husband is unable to assist her in movement. She would like a call back to discuss her options.

## 2021-04-12 NOTE — Telephone Encounter (Signed)
Please schedule patient for lab/MD/ taxol / trastuzumab in 2 weeks and notify pt of appt.

## 2021-04-14 ENCOUNTER — Ambulatory Visit: Payer: Medicare Other | Admitting: Cardiovascular Disease

## 2021-04-14 ENCOUNTER — Encounter: Payer: Self-pay | Admitting: Cardiovascular Disease

## 2021-04-14 ENCOUNTER — Other Ambulatory Visit: Payer: Self-pay

## 2021-04-14 VITALS — BP 138/78 | HR 61 | Ht 65.0 in | Wt 282.1 lb

## 2021-04-14 DIAGNOSIS — I480 Paroxysmal atrial fibrillation: Secondary | ICD-10-CM | POA: Diagnosis not present

## 2021-04-14 DIAGNOSIS — C50919 Malignant neoplasm of unspecified site of unspecified female breast: Secondary | ICD-10-CM | POA: Diagnosis not present

## 2021-04-14 DIAGNOSIS — D709 Neutropenia, unspecified: Secondary | ICD-10-CM | POA: Diagnosis not present

## 2021-04-14 MED ORDER — LOSARTAN POTASSIUM 25 MG PO TABS: 25.0000 mg | ORAL_TABLET | Freq: Every day | ORAL | 3 refills | Status: DC

## 2021-04-14 NOTE — Patient Instructions (Addendum)
Medication Instructions:  No changes  If you need a refill on your cardiac medications before your next appointment, please call your pharmacy.   Lab work: No new labs needed  Testing/Procedures: No new testing needed   Follow-Up: At [facility], you and your health needs are our priority.  As part of our continuing mission to provide you with exceptional heart care, we have created designated Provider Care Teams.  These Care Teams include your primary Cardiologist (physician) and Advanced Practice Providers (APPs -  Physician Assistants and Nurse Practitioners) who all work together to provide you with the care you need, when you need it.  You will need a follow up appointment in 6 months  Providers on your designated Care Team:   Christopher Berge, NP Ryan Dunn, PA-C Jacquelyn Visser, PA-C Cadence Furth, PA-C   COVID-19 Vaccine Information can be found at: [email or web address] For questions related to vaccine distribution or appointments, please email [email or web address] or call [phone].    

## 2021-04-14 NOTE — Progress Notes (Signed)
Date:  04/14/2021   ID:  [name], DOB 03/30/1951, MRN [id number]  Patient Location:  [place] [address]   Provider location:   [facility], [place] office  PCP:  [name], FNP  Cardiologist:  Arvid Right [facility]  Chief Complaint  Patient presents with   Other    1 month f/u c/o weakness and no strength. Please discuss losartan correct dose pt should be taking?. Meds reviewed verbally with pt.    History of Present Illness:    Amber Barnes is a 70 y.o. female  past medical history of Paroxysmal atrial fibrillation, cardioversion 2018 Normal sinus rhythm did not hold on flecainide, changed to amiodarone  cardioversion, normal sinus rhythm obesity,  arthritis,  hyperlipidemia,  hypertension,  OSA , not on CPAP Migraines, anxiety Stress test January 2018 showing no ischemia, Ejection fraction 55-65% CHA2DS2-VASc of at least 3 (female, age, HTN). who presents for follow-up of her hyperlipidemia, hypertension, persistent atrial fibrillation  LOV 10/2019  Long discussion concerning recent events Reports diagnosis of breast cancer HER2 positive in July 2022, underwent lumpectomy, lymph node biopsy, recent start of chemotherapy with weekly Taxol plus  trastuzumab .   Chemotherapy early September 2022 She presented to the emergency room September 26, anorexia, malaise, shortness of breath, atrial fibrillation Heart rate in the emergency room 137 bpm Started on broad-spectrum antibiotics for possible sepsis, neutropenic fevers Started on diltiazem infusion for rate control, has been continued on her Eliquis Heart rate running low 100 bpm, asymptomatic  At home reports she takes Bystolic 5 mg twice daily with Ranexa and Eliquis Prior efforts to control her atrial fibrillation with flecainide in 2021 were unsuccessful, was changed to amiodarone She stopped amiodarone in 2021 secondary to potential side  effects, stomach upset   Reports she is slowly recovering, teeth hurt, head hurts where hair is falling out, armpits hurt, very fatigued Scheduled to see oncology in little over a week for consideration of more treatment   Currently taking bisoprolol 5 twice daily with her Ranexa, Eliquis Losartan dose decreased down to 25 daily given blood pressure has improved with weight loss  Lasix 20 mg p.o. twice daily.   EKG personally reviewed by myself on todays visit Normal sinus rhythm rate 61 bpm left axis deviation poor R wave progression through the anterior precordial leads  EKG today showing normal sinus rhythm rate 59 bpm no significant ST-T wave changes  Stress: lost a few friends covid Husband with MS Granddaughter living with them 70 yrs old   Other past medical history reviewed cardioversion procedure 09/07/2016 Normal sinus rhythm restored at that time Back into atrial fibrillation on last hospital visit March 13   Flecainide held, on amiodarone Repeat cardioversion 10/19/2016 Normal sinus rhythm restored   Recurrent atrial fib Seen by Dr. [name], Started on ranexa and continued on amiodarone She converted to normal sinus rhythm on her own, did not need DCCV   In the hospital for foot surgery, this was delayed as she was found to be in atrial fibrillation Admitted to the hospital 07/27/2016 Started on diltiazem, beta blocker, anticoagulation   Back in hospsital 08/10/2016 Hospital records reviewed She was very anxious about palpitations Developed chest discomfort, tightness Medication doses were increased for better rate control Lasix in the emergency room for shortness of breath Also given DuoNeb's    Past Medical History:  Diagnosis Date   Achilles tendinitis    Acute medial meniscal tear  Allergy    Anxiety    Atrial fibrillation ([place])    a. new onset 07/2016; b. CHADS2VASc --> 4 (HTN, female, age, CHF); c. eliquis   CHF (congestive heart failure) (HCC)     Chronic anticoagulation    Apixaban   Chronic lower back pain    Colon polyp    Complication of anesthesia    Emergence delirium; hypoxia   COPD (chronic obstructive pulmonary disease) ([place])    Cystic breast    Depression    Endometriosis    Fibrocystic breast disease    GERD (gastroesophageal reflux disease)    History of stress test    a. 07/2016 MV: EF 55-65%. Small, mild defect  in mid anteroseptal, apical septal, and apical locations. No ischemia-->low risk.   Hyperlipidemia    Hypertension    Insomnia    Lipoma    Malignant neoplasm of upper-outer quadrant of right female breast (HCC)    Stage 1A invasive mammary carcinoma (cT1b or T1c N0); ER (-), PR (+), HER2/neu (+)   Migraine    Mild Mitral regurgitation    a. 07/2016 Echo: EF 55-65%, no rwma, mild MR. Mildly dil LA. Nl RV fx.    OSA (obstructive sleep apnea)    non-compliant with nocturnal PAP therapy   Osteoarthritis    left knee   Osteopenia    Peptic ulcer disease    a. H. pylori   Pneumonia    RLS (restless legs syndrome)    Tendonitis    Thyroid disease    Vitamin D deficiency    Past Surgical History:  Procedure Laterality Date   ABDOMINAL HYSTERECTOMY     ovaries left   ACHILLES TENDON SURGERY     2012,01/2017   BACK SURGERY     fusion lumbar   BREAST BIOPSY Right 01/15/2021   Affirm biopsy-"X" clip-INVASIVE MAMMARY CARCINOMA   BREAST BIOPSY Right 01/15/2021   u/s bx-"venus" clip INVASIVE MAMMARY CARCINOMA   BREAST BIOPSY     BREAST LUMPECTOMY,RADIO FREQ LOCALIZER,AXILLARY SENTINEL LYMPH NODE BIOPSY Right 02/17/2021   Procedure: BREAST LUMPECTOMY,RADIO FREQ LOCALIZER,AXILLARY SENTINEL LYMPH NODE BIOPSY;  Surgeon: Ronny Bacon, MD;  Location: [facility];  Service: General;  Laterality: Right;   CARDIOVERSION N/A 09/07/2016   Procedure: CARDIOVERSION;  Surgeon: Minna Merritts, MD;  Location: [facility];  Service: Cardiovascular;  Laterality: N/A;   CARDIOVERSION N/A 10/19/2016   Procedure:  CARDIOVERSION;  Surgeon: Minna Merritts, MD;  Location: [facility];  Service: Cardiovascular;  Laterality: N/A;   CARDIOVERSION N/A 12/14/2016   Procedure: CARDIOVERSION;  Surgeon: Minna Merritts, MD;  Location: [facility];  Service: Cardiovascular;  Laterality: N/A;   COLONOSCOPY     COLONOSCOPY WITH PROPOFOL N/A 05/13/2020   Procedure: COLONOSCOPY WITH PROPOFOL;  Surgeon: Toledo, Benay Pike, MD;  Location: [facility];  Service: Gastroenterology;  Laterality: N/A;  ELIQUIS   ESOPHAGOGASTRODUODENOSCOPY     ESOPHAGOGASTRODUODENOSCOPY (EGD) WITH PROPOFOL N/A 05/13/2020   Procedure: ESOPHAGOGASTRODUODENOSCOPY (EGD) WITH PROPOFOL;  Surgeon: Toledo, Benay Pike, MD;  Location: [facility];  Service: Gastroenterology;  Laterality: N/A;   FOOT SURGERY Left    tendon   JOINT REPLACEMENT Right    hip   PORTA CATH INSERTION N/A 03/23/2021   Procedure: PORTA CATH INSERTION;  Surgeon: Katha Cabal, MD;  Location: [facility];  Service: Cardiovascular;  Laterality: N/A;   PORTACATH PLACEMENT N/A 03/23/2021   Procedure: ATTEMPTED INSERTION PORT-A-CATH;  Surgeon: Ronny Bacon, MD;  Location: [facility];  Service: General;  Laterality: N/A;   THYROIDECTOMY     thyroid lobectomy secondary to a benign nodule   TOTAL HIP ARTHROPLASTY Right 08/21/2018   Procedure: TOTAL HIP ARTHROPLASTY ANTERIOR APPROACH-RIGHT CELL SAVER REQUESTED;  Surgeon: [name], MD;  Location: [facility];  Service: Orthopedics;  Laterality: Right;     Current Meds  Medication Sig   acetaminophen (TYLENOL) 500 MG tablet Take 500 mg by mouth every 6 (six) hours as needed. Taking as needed   albuterol (VENTOLIN HFA) 108 (90 Base) MCG/ACT inhaler INHALE 2 PUFFS INTO THE LUNGS EVERY 8 (EIGHT) HOURS AS NEEDED FOR WHEEZING OR SHORTNESS OF BREATH.   azelastine (ASTELIN) 0.1 % nasal spray Place into both nostrils as needed for rhinitis. Use in each nostril as directed   B Complex Vitamins (VITAMIN B COMPLEX PO) Take 1  tablet by mouth daily.    bisoprolol (ZEBETA) 5 MG tablet Take 1 tablet (5 mg total) by mouth 2 (two) times daily.   busPIRone (BUSPAR) 5 MG tablet TAKE 1 TABLET BY MOUTH THREE TIMES A DAY AS NEEDED   calcium gluconate 500 MG tablet Take 500 tablets by mouth daily.   calcium-vitamin D (OSCAL WITH D) 500-200 MG-UNIT tablet Take 1 tablet by mouth daily with breakfast.   Cholecalciferol (VITAMIN D3) 5000 units CAPS Take 5,000 Units by mouth daily.   DULoxetine (CYMBALTA) 30 MG capsule Take 1 capsule (30 mg total) by mouth 2 (two) times daily. Home med.   ELIQUIS 5 MG TABS tablet TAKE 1 TABLET BY MOUTH TWICE A DAY   esomeprazole (NEXIUM) 20 MG capsule Take 20 mg by mouth as needed.   furosemide (LASIX) 20 MG tablet Take 1 tablet (20 mg total) by mouth 2 (two) times daily.   lidocaine-prilocaine (EMLA) cream Apply to affected area once   LORazepam (ATIVAN) 0.5 MG tablet Take 1 tablet (0.5 mg total) by mouth every 8 (eight) hours as needed for anxiety (nausea/ vomiting).   losartan (COZAAR) 25 MG tablet Take 0.5 tablets (12.5 mg total) by mouth daily. Reduced from 100 mg daily.   Multiple Vitamin (MULTIVITAMIN) tablet Take 1 tablet by mouth daily.   ondansetron (ZOFRAN) 8 MG tablet Take 1 tablet (8 mg total) by mouth every 8 (eight) hours as needed for nausea or vomiting.   potassium chloride SA (KLOR-CON) 20 MEQ tablet Take 1 tablet (20 mEq total) by mouth 2 (two) times daily.   prochlorperazine (COMPAZINE) 10 MG tablet Take 1 tablet (10 mg total) by mouth every 6 (six) hours as needed (Nausea or vomiting).   promethazine (PHENERGAN) 25 MG tablet Take 1 tablet (25 mg total) by mouth every 6 (six) hours as needed for nausea or vomiting.   ranolazine (RANEXA) 500 MG 12 hr tablet TAKE 1 TABLET BY MOUTH TWICE A DAY   rizatriptan (MAXALT) 10 MG tablet TAKE 1 TABLET (10 MG TOTAL) BY MOUTH AS NEEDED FOR MIGRAINE. MAY REPEAT IN 2 HOURS IF NEEDED   rOPINIRole (REQUIP) 1 MG tablet TAKE 1 TABLET BY MOUTH AT  BEDTIME.   rosuvastatin (CRESTOR) 20 MG tablet TAKE 1 TABLET BY MOUTH EVERY DAY     Allergies:   Prednisone, Amiodarone, Hydrocodone-acetaminophen, Oxycodone, Tapentadol, Tramadol, Adhesive [tape], and Latex   Social History   Tobacco Use   Smoking status: Former    Packs/day: 0.25    Years: 10.00    Pack years: 2.50    Types: Cigarettes    Quit date: 2005    Years since quitting: 17.7   Smokeless tobacco:  Never   Tobacco comments:    smoked for 10 years, 1 pack every 2-3 days  Vaping Use   Vaping Use: Never used  Substance Use Topics   Alcohol use: Not Currently    Alcohol/week: 1.0 standard drink    Types: 1 Glasses of wine per week    Comment: rarely   Drug use: No     Family Hx: The patient's family history includes Breast cancer in her paternal aunt; Cancer in her brother and mother; Cancer (age of onset: 31) in her sister; Cancer (age of onset: 97) in her father; Depression in her sister; Heart attack (age of onset: 17) in her father; Heart disease in her father; Hyperlipidemia in her father; Hypertension in her father. There is no history of Migraines.  ROS:   Please see the history of present illness.    Review of Systems  Constitutional:  Positive for malaise/fatigue.  HENT: Negative.    Respiratory: Negative.    Cardiovascular: Negative.   Gastrointestinal: Negative.   Genitourinary: Negative.   Musculoskeletal: Negative.   Neurological: Negative.   Psychiatric/Behavioral: Negative.    All other systems reviewed and are negative.   Labs/Other Tests and Data Reviewed:    Recent Labs: 04/05/2021: ALT 22 04/09/2021: BUN 7; Creatinine, Ser 0.88; Hemoglobin 10.0; Magnesium 1.6; Platelets 143; Potassium 3.6; Sodium 137   Recent Lipid Panel Lab Results  Component Value Date/Time   CHOL 190 03/25/2020 08:02 AM   TRIG 122.0 03/25/2020 08:02 AM   HDL 69.30 03/25/2020 08:02 AM   CHOLHDL 3 03/25/2020 08:02 AM   LDLCALC 96 03/25/2020 08:02 AM    Wt Readings  from Last 3 Encounters:  04/14/21 282 lb 2 oz (128 kg)  04/07/21 286 lb 9.6 oz (130 kg)  03/31/21 277 lb (125.6 kg)     Exam:    Vital Signs: Vital signs may also be detailed in the HPI BP 138/78 (BP Location: Left Arm, Patient Position: Sitting, Cuff Size: Large)   Pulse 61   Ht _0  (1.651 m)   Wt 282 lb 2 oz (128 kg)   SpO2 98%   BMI 46.95 kg/m   Constitutional:  oriented to person, place, and time. No distress.  HENT:  Head: Grossly normal Eyes:  no discharge. No scleral icterus.  Neck: No JVD, no carotid bruits  Cardiovascular: Regular rate and rhythm, no murmurs appreciated Pulmonary/Chest: Clear to auscultation bilaterally, no wheezes or rails Abdominal: Soft.  no distension.  no tenderness.  Musculoskeletal: Normal range of motion Neurological:  normal muscle tone. Coordination normal. No atrophy Skin: Skin warm and dry Psychiatric: normal affect, pleasant  ASSESSMENT & PLAN:    Breast cancer Undergoing chemotherapy, schedule see oncology in 1 week  Essential hypertension Will continue low-dose losartan 25, down from 100 Lasix 20 twice daily With further weight loss, blood pressure may decrease  Atrial fibrillation, paroxysmal Back in normal sinus rhythm Will recommend she continue bisoprolol 5 twice daily, Ranexa, Eliquis   SOB (shortness of breath) On Lasix 20 twice daily, stable BMP   Morbid obesity (HCC) Losing weight through recent treatments    Total encounter time more than 25 minutes  Greater than 50% was spent in counseling and coordination of care with the patient     Signed, Ida Rogue, MD  04/14/2021 4:20 PM    [place] [facility] [id number], [place], [place] [address]

## 2021-04-19 ENCOUNTER — Other Ambulatory Visit: Payer: Self-pay

## 2021-04-19 DIAGNOSIS — C50919 Malignant neoplasm of unspecified site of unspecified female breast: Secondary | ICD-10-CM

## 2021-04-19 NOTE — Progress Notes (Addendum)
Patient suffers from Breast cancer which impairs their ability to perform daily activities like bathing, dressing, moving around in the home.  A walker will not resolve issue with performing activities of daily living. A heavy duty wheelchair will allow patient to safely perform daily activities. Patient can safely propel the heavy duty wheelchair in the home or has a caregiver who can provide assistance. Length of need is lifetime.  Accessories: elevating leg rests (ELRs), wheel locks, extensions and anti-tippers.

## 2021-04-21 ENCOUNTER — Telehealth: Payer: Self-pay | Admitting: *Deleted

## 2021-04-21 ENCOUNTER — Encounter: Payer: Self-pay | Admitting: Family

## 2021-04-21 ENCOUNTER — Encounter: Payer: Self-pay | Admitting: *Deleted

## 2021-04-21 NOTE — Telephone Encounter (Signed)
Call returned to patient and advised per NP to retest tomorrow and call us back and that if she is positive, we will order antivirals and reschedule her Monday appointment. She was in agreement with this plan

## 2021-04-21 NOTE — Telephone Encounter (Signed)
Tell her to test again in the morning and if she is positive I can send her in an antiviral.   Faythe Casa, NP 04/21/2021 3:07 PM

## 2021-04-21 NOTE — Telephone Encounter (Signed)
Patient called reporting that she is scheduled for chemotherapy Monday, but she has been exposed to [employer]. She reports that she took a home COVID test today and it was negative, but she has congestion, no taste or sense of smell. She is asking what she needs to do now. Please advise

## 2021-04-22 ENCOUNTER — Telehealth: Payer: Self-pay | Admitting: *Deleted

## 2021-04-22 ENCOUNTER — Encounter: Payer: Self-pay | Admitting: Physician Assistant

## 2021-04-22 ENCOUNTER — Encounter: Payer: Self-pay | Admitting: Oncology

## 2021-04-22 ENCOUNTER — Other Ambulatory Visit: Payer: Self-pay

## 2021-04-22 ENCOUNTER — Telehealth (INDEPENDENT_AMBULATORY_CARE_PROVIDER_SITE_OTHER): Payer: Medicare Other | Admitting: Physician Assistant

## 2021-04-22 DIAGNOSIS — U071 COVID-19: Secondary | ICD-10-CM | POA: Diagnosis not present

## 2021-04-22 MED ORDER — MOLNUPIRAVIR EUA 200MG CAPSULE: 4.0000 | ORAL_CAPSULE | Freq: Two times a day (BID) | ORAL | 0 refills | Status: AC

## 2021-04-22 MED ORDER — PREDNISONE 20 MG PO TABS: 40.0000 mg | ORAL_TABLET | Freq: Every day | ORAL | 0 refills | Status: DC

## 2021-04-22 NOTE — Telephone Encounter (Signed)
Patient called reporting that her COVID test is positive and is asking for medicine for it. I also see in her chart that she is scheduled for VV with her PCP NP.

## 2021-04-22 NOTE — Telephone Encounter (Signed)
Please move appts out 2 weeks

## 2021-04-22 NOTE — Telephone Encounter (Signed)
PCP office started patient on antiviral and steroids this morning. She is scheduled for treatment Monday and to see md

## 2021-04-22 NOTE — Telephone Encounter (Signed)
I called patient who was currently very congested, but denied any SOB. I was worried with patient's health conditions & cancer dx that she needed antiviral. I was able to schedule patient with Inda Coke at [facility] for VV at 9;30.

## 2021-04-22 NOTE — Progress Notes (Signed)
Virtual Visit via Video   I connected with [name] on 04/22/21 at  9:30 AM EDT by a video enabled telemedicine application and verified that I am speaking with the correct person using two identifiers. Location patient: Home Location provider: [facility], Office Persons participating in the virtual visit: [place], [place] PA-C  I discussed the limitations of evaluation and management by telemedicine and the availability of in person appointments. The patient expressed understanding and agreed to proceed.  Subjective:   HPI:   COVID-19 Symptoms began 4 days ago. Her husband has also been sick and he tested positive yesterday.  She is fully vaccinated and has never had COVID. She tested positive with home test yesterday.  She is currently on chemotherapy. Has current dx of COPD. Uses albuterol inhaler prn. Currently having: night sweats, dry cough, nasal congestion. Denies: fever, SOB, poor PO intake, chest pain, n/v/d.  Treatments tried: upright position and hydration  Patient risk factors: Current ZESPQ-33 risk of complications score: 6 Smoking status: Bhavika Schnider  reports that she quit smoking about 17 years ago. Her smoking use included cigarettes. She has a 2.50 pack-year smoking history. She has never used smokeless tobacco. If female, currently pregnant? []   Yes [x]   No  ROS: See pertinent positives and negatives per HPI.  Patient Active Problem List   Diagnosis Date Noted   Neutropenia ([place])    Anxiety    Intractable vomiting    Diarrhea    Neutropenic fever ([place]) 04/05/2021   Encounter for antineoplastic chemotherapy 03/24/2021   Neuropathy 03/24/2021   Port-A-Cath in place 03/24/2021   HER2-positive carcinoma of breast ([place]) 01/26/2021   Goals of care, counseling/discussion 01/26/2021   Malignant neoplasm of upper-outer quadrant of right female breast ([place]) 01/19/2021   Left arm pain 12/02/2020   Soft tissue mass 12/02/2020    Candidal intertrigo 09/09/2020   Urinary frequency 09/09/2020   Night sweat 03/31/2020   Nasal drainage 12/18/2019   Sinus congestion 12/18/2019   Nausea 10/30/2019   Major depression in remission ([place]) 08/27/2018   Primary osteoarthritis involving multiple joints 08/27/2018   Atrial fibrillation ([place]) 08/27/2018   COPD (chronic obstructive pulmonary disease) ([place]) 08/27/2018   BMI 50.0-59.9, adult ([place]) 07/16/2018   Morbid obesity with BMI of 50.0-59.9, adult ([place]) 05/31/2018   Bradycardia 05/22/2017   Restless leg syndrome 04/13/2017   OSA (obstructive sleep apnea) 01/09/2017   Fatty liver disease, nonalcoholic 00/76/2263   Family history of ovarian cancer 06/08/2016   GERD (gastroesophageal reflux disease) 05/05/2016   Hypertension 11/11/2014   Anxiety and depression 11/11/2014   Weight loss 11/11/2014    Social History   Tobacco Use   Smoking status: Former    Packs/day: 0.25    Years: 10.00    Pack years: 2.50    Types: Cigarettes    Quit date: 2005    Years since quitting: 17.7   Smokeless tobacco: Never   Tobacco comments:    smoked for 10 years, 1 pack every 2-3 days  Substance Use Topics   Alcohol use: Not Currently    Alcohol/week: 1.0 standard drink    Types: 1 Glasses of wine per week    Comment: rarely    Current Outpatient Medications:    molnupiravir EUA (LAGEVRIO) 200 mg CAPS capsule, Take 4 capsules (800 mg total) by mouth 2 (two) times daily for 5 days., Disp: 40 capsule, Rfl: 0   predniSONE (DELTASONE) 20 MG tablet, Take 2 tablets (40 mg total) by  mouth daily., Disp: 10 tablet, Rfl: 0   acetaminophen (TYLENOL) 500 MG tablet, Take 500 mg by mouth every 6 (six) hours as needed. Taking as needed, Disp: , Rfl:    albuterol (VENTOLIN HFA) 108 (90 Base) MCG/ACT inhaler, INHALE 2 PUFFS INTO THE LUNGS EVERY 8 (EIGHT) HOURS AS NEEDED FOR WHEEZING OR SHORTNESS OF BREATH., Disp: 6.7 g, Rfl: 0   azelastine (ASTELIN) 0.1 % nasal spray, Place into both nostrils as  needed for rhinitis. Use in each nostril as directed, Disp: , Rfl:    B Complex Vitamins (VITAMIN B COMPLEX PO), Take 1 tablet by mouth daily. , Disp: , Rfl:    bisoprolol (ZEBETA) 5 MG tablet, Take 1 tablet (5 mg total) by mouth 2 (two) times daily., Disp: 180 tablet, Rfl: 1   busPIRone (BUSPAR) 5 MG tablet, TAKE 1 TABLET BY MOUTH THREE TIMES A DAY AS NEEDED, Disp: 60 tablet, Rfl: 1   calcium gluconate 500 MG tablet, Take 500 tablets by mouth daily., Disp: , Rfl:    calcium-vitamin D (OSCAL WITH D) 500-200 MG-UNIT tablet, Take 1 tablet by mouth daily with breakfast., Disp: , Rfl:    Cholecalciferol (VITAMIN D3) 5000 units CAPS, Take 5,000 Units by mouth daily., Disp: , Rfl:    DULoxetine (CYMBALTA) 30 MG capsule, Take 1 capsule (30 mg total) by mouth 2 (two) times daily. Home med., Disp: , Rfl:    ELIQUIS 5 MG TABS tablet, TAKE 1 TABLET BY MOUTH TWICE A DAY, Disp: 180 tablet, Rfl: 1   esomeprazole (NEXIUM) 20 MG capsule, Take 20 mg by mouth as needed., Disp: , Rfl:    furosemide (LASIX) 20 MG tablet, Take 1 tablet (20 mg total) by mouth 2 (two) times daily., Disp: 180 tablet, Rfl: 0   lidocaine-prilocaine (EMLA) cream, Apply to affected area once, Disp: 30 g, Rfl: 3   LORazepam (ATIVAN) 0.5 MG tablet, Take 1 tablet (0.5 mg total) by mouth every 8 (eight) hours as needed for anxiety (nausea/ vomiting)., Disp: 60 tablet, Rfl: 0   losartan (COZAAR) 25 MG tablet, Take 1 tablet (25 mg total) by mouth daily., Disp: 90 tablet, Rfl: 3   Multiple Vitamin (MULTIVITAMIN) tablet, Take 1 tablet by mouth daily., Disp: , Rfl:    ondansetron (ZOFRAN) 8 MG tablet, Take 1 tablet (8 mg total) by mouth every 8 (eight) hours as needed for nausea or vomiting., Disp: 60 tablet, Rfl: 0   potassium chloride SA (KLOR-CON) 20 MEQ tablet, Take 1 tablet (20 mEq total) by mouth 2 (two) times daily., Disp: 120 tablet, Rfl: 2   prochlorperazine (COMPAZINE) 10 MG tablet, Take 1 tablet (10 mg total) by mouth every 6 (six) hours as  needed (Nausea or vomiting)., Disp: 30 tablet, Rfl: 1   promethazine (PHENERGAN) 25 MG tablet, Take 1 tablet (25 mg total) by mouth every 6 (six) hours as needed for nausea or vomiting., Disp: 60 tablet, Rfl: 0   ranolazine (RANEXA) 500 MG 12 hr tablet, TAKE 1 TABLET BY MOUTH TWICE A DAY, Disp: 180 tablet, Rfl: 0   rizatriptan (MAXALT) 10 MG tablet, TAKE 1 TABLET (10 MG TOTAL) BY MOUTH AS NEEDED FOR MIGRAINE. MAY REPEAT IN 2 HOURS IF NEEDED, Disp: 10 tablet, Rfl: 0   rOPINIRole (REQUIP) 1 MG tablet, TAKE 1 TABLET BY MOUTH AT BEDTIME., Disp: 90 tablet, Rfl: 1   rosuvastatin (CRESTOR) 20 MG tablet, TAKE 1 TABLET BY MOUTH EVERY DAY, Disp: 90 tablet, Rfl: 0  Allergies  Allergen Reactions   Prednisone  Other (See Comments)    Agitation, hyperactivity, polyphagia   Amiodarone Nausea And Vomiting   Hydrocodone-Acetaminophen Other (See Comments)    Hallucinations and combative   Oxycodone Itching    Can take it with benadryl   Tapentadol Itching   Tramadol Itching    Can take it with benadryl   Adhesive [Tape] Itching and Rash    Prefers paper tape   Latex Itching and Rash    use paper tap    Objective:   VITALS: Per patient if applicable, see vitals. GENERAL: Alert, appears well and in no acute distress. HEENT: Atraumatic, conjunctiva clear, no obvious abnormalities on inspection of external nose and ears. NECK: Normal movements of the head and neck. CARDIOPULMONARY: No increased WOB. Speaking in clear sentences. I:E ratio WNL.  MS: Moves all visible extremities without noticeable abnormality. PSYCH: Pleasant and cooperative, well-groomed. Speech normal rate and rhythm. Affect is appropriate. Insight and judgement are appropriate. Attention is focused, linear, and appropriate.  NEURO: CN grossly intact. Oriented as arrived to appointment on time with no prompting. Moves both UE equally.  SKIN: No obvious lesions, wounds, erythema, or cyanosis noted on face or hands.  Assessment and Plan:    Shaletha was seen today for covid positive.  Diagnoses and all orders for this visit:  COVID-19  Other orders -     predniSONE (DELTASONE) 20 MG tablet; Take 2 tablets (40 mg total) by mouth daily. -     molnupiravir EUA (LAGEVRIO) 200 mg CAPS capsule; Take 4 capsules (800 mg total) by mouth 2 (two) times daily for 5 days.   No red flags on discussion, patient is not in any obvious distress during our visit.  Start molnupiravir and oral prednisone. Does have allergy to prednisone causing agitation but she reports that this was only when she took this with hydrocodone. Discussed starting this for her wheeze and if has any concerns with medication side effects to stop medication and let us know.  Very low threshold to go to the ER if any owrsneing symptoms due to immunocompromised status. I have also enrolled her in the mychart monitoring program.  Reviewed return precautions including new/worsening fever, SOB, new/worsening cough, sudden onset changes of symptoms. Recommended need to self-quarantine and practice social distancing until symptoms resolve. I recommend that patient follow-up if symptoms worsen or persist despite treatment x 7-10 days, sooner if needed.  I discussed the assessment and treatment plan with the patient. The patient was provided an opportunity to ask questions and all were answered. The patient agreed with the plan and demonstrated an understanding of the instructions.   The patient was advised to call back or seek an in-person evaluation if the symptoms worsen or if the condition fails to improve as anticipated.  [place], [place] 04/22/2021

## 2021-04-26 ENCOUNTER — Inpatient Hospital Stay: Payer: Medicare Other

## 2021-04-26 ENCOUNTER — Inpatient Hospital Stay: Payer: Medicare Other | Admitting: Oncology

## 2021-04-30 ENCOUNTER — Telehealth (INDEPENDENT_AMBULATORY_CARE_PROVIDER_SITE_OTHER): Payer: Medicare Other | Admitting: Family

## 2021-04-30 VITALS — Ht 65.0 in

## 2021-04-30 DIAGNOSIS — I10 Essential (primary) hypertension: Secondary | ICD-10-CM

## 2021-04-30 DIAGNOSIS — U071 COVID-19: Secondary | ICD-10-CM | POA: Diagnosis not present

## 2021-04-30 DIAGNOSIS — I480 Paroxysmal atrial fibrillation: Secondary | ICD-10-CM | POA: Diagnosis not present

## 2021-04-30 DIAGNOSIS — G2581 Restless legs syndrome: Secondary | ICD-10-CM

## 2021-04-30 MED ORDER — ROPINIROLE HCL 0.5 MG PO TABS: 0.5000 mg | ORAL_TABLET | Freq: Every day | ORAL | 0 refills | Status: DC

## 2021-04-30 NOTE — Assessment & Plan Note (Signed)
Normal ferritin. May repeat at follow up as low end normal.  Currently on Requip 1 mg, and I have given her a 0.5 mg tablet to take nightly.  She will let me know how she is doing

## 2021-04-30 NOTE — Assessment & Plan Note (Addendum)
Chronic, stable.Compliant losartan 25 mg, Lasix 20 mg twice daily. Will continue to monitor closely due to aggressive weight loss in setting of chemotherapy, breast cancer

## 2021-04-30 NOTE — Progress Notes (Signed)
Virtual Visit via Video Note  I connected with@  on 04/30/21 at 11:30 AM EDT by a video enabled telemedicine application and verified that I am speaking with the correct person using two identifiers.  Location patient: home Location provider:work  Persons participating in the virtual visit: patient, provider  I discussed the limitations of evaluation and management by telemedicine and the availability of in person appointments. The patient expressed understanding and agreed to proceed.   HPI: Complains of feeling fatigue since hospitalized 04/05/21. She was feeling this had improved slightly.  She then contracted covid and fatigue started to worsen. Nausea has resolved as not on chemo at this time. Eating as much as she can. No vomiting, fever.     Blood pressure is 'doing good.' This morning 143/80 prior to medication. Usually 120-125/75-80.   She still cannot taste or smell from covid.  Cough has improved. She has sa02 at home with 95-97% when sitting. She can walk in the rooms in the home without SOB. She may feel occasional mild SOB with activiy.  No cp, leg swelling, orthopnea.   Complains of poorly controlled restless leg syndrome-she is compliant with Requip 1mg . She suspects chemo has contributed. Neuropathy had started in bilateral feet prior to last dose of chemo.   recently contracted COVID-19 1 week ago. Seen by Inda Coke and started on prednisone and molnupiravir.   Right Breast cancer with chemotherapy which started September 2022 Following Dr. Tasia Catchings.  Treatment complicated by nausea. Plans to resume chemo end of this mouth.   Follow-up with cardiology, Dr. [name] 04/14/2021 without changes in medications  Recently hospitalized for neutropenic fever with associated SIRS.  No source of infection.    Hypertension-compliant with losartan 25 mg, Lasix 20 mg twice daily  Atrial fibrillation-compliant with bisoprolol 5 mg twice daily, Ranexa and Eliquis  ROS: See pertinent  positives and negatives per HPI.    EXAM:  VITALS per patient if applicable: Ht 5\' 5"  (1.651 m)   BMI 46.95 kg/m  BP Readings from Last 3 Encounters:  04/14/21 138/78  04/09/21 104/69  03/31/21 125/84   Wt Readings from Last 3 Encounters:  04/14/21 282 lb 2 oz (128 kg)  04/07/21 286 lb 9.6 oz (130 kg)  03/31/21 277 lb (125.6 kg)    GENERAL: alert, oriented, appears well and in no acute distress  HEENT: atraumatic, conjunttiva clear, no obvious abnormalities on inspection of external nose and ears  NECK: normal movements of the head and neck  LUNGS: on inspection no signs of respiratory distress, breathing rate appears normal, no obvious gross SOB, gasping or wheezing  CV: no obvious cyanosis  MS: moves all visible extremities without noticeable abnormality  PSYCH/NEURO: pleasant and cooperative, no obvious depression or anxiety, speech and thought processing grossly intact  ASSESSMENT AND PLAN:  Discussed the following assessment and plan:  Problem List Items Addressed This Visit       Cardiovascular and Mediastinum   Atrial fibrillation (HCC)    Chronic, stable. Continue bisoprolol 5 mg twice daily, Ranexa and Eliquis as managed by Dr [name].      Hypertension    Chronic, stable.Compliant losartan 25 mg, Lasix 20 mg twice daily. Will continue to monitor closely due to aggressive weight loss in setting of chemotherapy, breast cancer        Other   COVID-19    Cough, congestion improved.  Continues to have fatigue which we discussed as secondary to recent hospitalization and deconditioning, and now unfortunately COVID.  We discussed her pulse oximetry at home, and I have encouraged her to use when walking to ensure that her oxygen is not dropping below 90% which would require a prompt visit to emergency room or calling 911.  Advised as much she can to walk within her home to rebuild her strength, endurance.  She will let me know how she is doing      Restless  leg syndrome - Primary    Normal ferritin. May repeat at follow up as low end normal.  Currently on Requip 1 mg, and I have given her a 0.5 mg tablet to take nightly.  She will let me know how she is doing      Relevant Medications   rOPINIRole (REQUIP) 0.5 MG tablet    -we discussed possible serious and likely etiologies, options for evaluation and workup, limitations of telemedicine visit vs in person visit, treatment, treatment risks and precautions. Pt prefers to treat via telemedicine empirically rather then risking or undertaking an in person visit at this moment.  .   I discussed the assessment and treatment plan with the patient. The patient was provided an opportunity to ask questions and all were answered. The patient agreed with the plan and demonstrated an understanding of the instructions.   The patient was advised to call back or seek an in-person evaluation if the symptoms worsen or if the condition fails to improve as anticipated.   [name], FNP

## 2021-04-30 NOTE — Patient Instructions (Addendum)
Please monitor oxygen and if when walking or sitting sao2 drops below 90, you would need to go to emergency room. Please continue to closely monitor  As you can, please walk in your home to gradually increase your endurance and  strength again.  This will help with your fatigue.  You may stay on Requip for restless legs ,1 mg . I have also sent in an additional 0.5 mg tablet for you to take with the 1 mg tablet at night.  Please let me know if this is helpful for you.  Please also note that I continue to think about you and cheer you on.  .  Let me know how I can support you.

## 2021-04-30 NOTE — Assessment & Plan Note (Signed)
Cough, congestion improved.  Continues to have fatigue which we discussed as secondary to recent hospitalization and deconditioning, and now unfortunately COVID.  We discussed her pulse oximetry at home, and I have encouraged her to use when walking to ensure that her oxygen is not dropping below 90% which would require a prompt visit to emergency room or calling 911.  Advised as much she can to walk within her home to rebuild her strength, endurance.  She will let me know how she is doing

## 2021-04-30 NOTE — Assessment & Plan Note (Signed)
Chronic, stable. Continue bisoprolol 5 mg twice daily, Ranexa and Eliquis as managed by Dr [name].

## 2021-05-09 ENCOUNTER — Other Ambulatory Visit: Payer: Self-pay | Admitting: Cardiovascular Disease

## 2021-05-10 ENCOUNTER — Inpatient Hospital Stay: Payer: Medicare Other

## 2021-05-10 ENCOUNTER — Inpatient Hospital Stay: Payer: Medicare Other | Attending: Oncology | Admitting: Oncology

## 2021-05-10 ENCOUNTER — Encounter: Payer: Self-pay | Admitting: Oncology

## 2021-05-10 ENCOUNTER — Other Ambulatory Visit: Payer: Self-pay

## 2021-05-10 VITALS — BP 102/70 | HR 66 | Temp 96.9°F | Wt 278.1 lb

## 2021-05-10 DIAGNOSIS — Z5111 Encounter for antineoplastic chemotherapy: Secondary | ICD-10-CM | POA: Diagnosis not present

## 2021-05-10 DIAGNOSIS — G629 Polyneuropathy, unspecified: Secondary | ICD-10-CM | POA: Diagnosis not present

## 2021-05-10 DIAGNOSIS — Z171 Estrogen receptor negative status [ER-]: Secondary | ICD-10-CM | POA: Insufficient documentation

## 2021-05-10 DIAGNOSIS — R112 Nausea with vomiting, unspecified: Secondary | ICD-10-CM | POA: Diagnosis not present

## 2021-05-10 DIAGNOSIS — D6481 Anemia due to antineoplastic chemotherapy: Secondary | ICD-10-CM | POA: Insufficient documentation

## 2021-05-10 DIAGNOSIS — R110 Nausea: Secondary | ICD-10-CM | POA: Insufficient documentation

## 2021-05-10 DIAGNOSIS — I509 Heart failure, unspecified: Secondary | ICD-10-CM | POA: Diagnosis not present

## 2021-05-10 DIAGNOSIS — C50411 Malignant neoplasm of upper-outer quadrant of right female breast: Secondary | ICD-10-CM | POA: Diagnosis not present

## 2021-05-10 DIAGNOSIS — T451X5A Adverse effect of antineoplastic and immunosuppressive drugs, initial encounter: Secondary | ICD-10-CM | POA: Diagnosis not present

## 2021-05-10 DIAGNOSIS — C50919 Malignant neoplasm of unspecified site of unspecified female breast: Secondary | ICD-10-CM | POA: Diagnosis not present

## 2021-05-10 LAB — CBC WITH DIFFERENTIAL/PLATELET
Abs Immature Granulocytes: 0.02 10*3/uL (ref 0.00–0.07)
Basophils Absolute: 0 10*3/uL (ref 0.0–0.1)
Basophils Relative: 1 %
Eosinophils Absolute: 0.1 10*3/uL (ref 0.0–0.5)
Eosinophils Relative: 2 %
HCT: 36.3 % (ref 36.0–46.0)
Hemoglobin: 11.7 g/dL — ABNORMAL LOW (ref 12.0–15.0)
Immature Granulocytes: 0 %
Lymphocytes Relative: 27 %
Lymphs Abs: 1.6 10*3/uL (ref 0.7–4.0)
MCH: 30.6 pg (ref 26.0–34.0)
MCHC: 32.2 g/dL (ref 30.0–36.0)
MCV: 95 fL (ref 80.0–100.0)
Monocytes Absolute: 0.3 10*3/uL (ref 0.1–1.0)
Monocytes Relative: 5 %
Neutro Abs: 3.8 10*3/uL (ref 1.7–7.7)
Neutrophils Relative %: 65 %
Platelets: 150 10*3/uL (ref 150–400)
RBC: 3.82 MIL/uL — ABNORMAL LOW (ref 3.87–5.11)
RDW: 16 % — ABNORMAL HIGH (ref 11.5–15.5)
WBC: 5.8 10*3/uL (ref 4.0–10.5)
nRBC: 0 % (ref 0.0–0.2)

## 2021-05-10 LAB — COMPREHENSIVE METABOLIC PANEL
ALT: 16 U/L (ref 0–44)
AST: 19 U/L (ref 15–41)
Albumin: 3.4 g/dL — ABNORMAL LOW (ref 3.5–5.0)
Alkaline Phosphatase: 65 U/L (ref 38–126)
Anion gap: 11 (ref 5–15)
BUN: 12 mg/dL (ref 8–23)
CO2: 26 mmol/L (ref 22–32)
Calcium: 8.4 mg/dL — ABNORMAL LOW (ref 8.9–10.3)
Chloride: 102 mmol/L (ref 98–111)
Creatinine, Ser: 0.87 mg/dL (ref 0.44–1.00)
GFR, Estimated: >60 mL/min (ref >60)
Glucose, Bld: 111 mg/dL — ABNORMAL HIGH (ref 70–99)
Potassium: 3.4 mmol/L — ABNORMAL LOW (ref 3.5–5.1)
Sodium: 139 mmol/L (ref 135–145)
Total Bilirubin: 0.7 mg/dL (ref 0.3–1.2)
Total Protein: 6.4 g/dL — ABNORMAL LOW (ref 6.5–8.1)

## 2021-05-10 MED ORDER — ACETAMINOPHEN 325 MG PO TABS
650.0000 mg | ORAL_TABLET | Freq: Once | ORAL | Status: AC
  Administered 2021-05-10: 650 mg via ORAL
  Filled 2021-05-10: qty 2

## 2021-05-10 MED ORDER — SODIUM CHLORIDE 0.9 % IV SOLN
65.0000 mg/m2 | Freq: Once | INTRAVENOUS | Status: AC
  Administered 2021-05-10: 156 mg via INTRAVENOUS
  Filled 2021-05-10: qty 26

## 2021-05-10 MED ORDER — SODIUM CHLORIDE 0.9 % IV SOLN
10.0000 mg | Freq: Once | INTRAVENOUS | Status: AC
  Administered 2021-05-10: 10 mg via INTRAVENOUS
  Filled 2021-05-10: qty 10

## 2021-05-10 MED ORDER — FAMOTIDINE 20 MG IN NS 100 ML IVPB
20.0000 mg | Freq: Once | INTRAVENOUS | Status: AC
  Administered 2021-05-10: 20 mg via INTRAVENOUS
  Filled 2021-05-10: qty 20

## 2021-05-10 MED ORDER — SODIUM CHLORIDE 0.9 % IV SOLN
Freq: Once | INTRAVENOUS | Status: AC
  Filled 2021-05-10: qty 250

## 2021-05-10 MED ORDER — DIPHENHYDRAMINE HCL 50 MG/ML IJ SOLN
25.0000 mg | Freq: Once | INTRAMUSCULAR | Status: AC
Start: 2021-05-10 — End: 2021-05-10
  Administered 2021-05-10: 25 mg via INTRAVENOUS
  Filled 2021-05-10: qty 1

## 2021-05-10 MED ORDER — TRASTUZUMAB-ANNS CHEMO 150 MG IV SOLR
4.0000 mg/kg | Freq: Once | INTRAVENOUS | Status: AC
  Administered 2021-05-10: 525 mg via INTRAVENOUS
  Filled 2021-05-10: qty 25

## 2021-05-10 MED ORDER — HEPARIN SOD (PORK) LOCK FLUSH 100 UNIT/ML IV SOLN
500.0000 [IU] | Freq: Once | INTRAVENOUS | Status: AC | PRN
  Administered 2021-05-10: 500 [IU]
  Filled 2021-05-10: qty 5

## 2021-05-10 NOTE — Progress Notes (Signed)
Hematology/Oncology Progress note [facility] [phone] [phone]   Patient Care Team: Amber Hawthorne, FNP as PCP - General (Family Medicine) Amber Barnes Amber November, MD as PCP - Cardiology (Cardiology) Charletta Cousin, MD (Inactive) Amber Barnes, Amber November, MD as Consulting Physician (Cardiology) Amber Junker, RN as Oncology Nurse Navigator Amber Server, MD as Consulting Physician (Hematology and Oncology)  REFERRING PROVIDER: [name], FNP  CHIEF COMPLAINTS/REASON FOR VISIT:  Follow-up for stage I HER2 positive breast cancer  HISTORY OF PRESENTING ILLNESS:   Amber Barnes is a  70 y.o.  female with PMH listed below was seen in consultation at the request of  Amber Hawthorne, FNP  for evaluation of weight loss, family history ovarian cancer. Patient reports a history of nausea vomiting, early satiety, dysphagia symptoms that started in March.  She has lost significant amount of weight as well. Patient weighed 340 pounds in February 2021, 309 pounds in May 2021 and currently she weighs 296 pounds today. Patient has tried Zofran and scopolamine patch which did not relieve her symptoms. 11/05/2019, CT abdomen pelvis showed chronic changes with out acute abnormality to correspond with the patient's symptoms. Patient has had right upper quadrant ultrasound done on 11/14/2019, which showed hepatic steatosis with no other acute abnormality. Patient was seen by gastroenterology Dr. Alice Barnes on 11/18/2019.  Patient was recommended to take PPI daily.  Added Carafate.  H. pylori testing came back positive and the patient was started on a course of tetracycline, metronidazole, Pepto-Bismol for H. pylori treatments.  Patient reports that she has been on the treatment for 7 days now.  Nausea is slightly better but still persistent.  She is following up with gastroenterology next week for discussion of EGD. Currently she is not able to tolerate colonoscopy  prep due to the nausea.  Her stool was tested positive for occult blood. She also reports night sweating which is a chronic symptom for her. Patient was last seen on 05/20/2020 for evaluation of weight loss, family history of ovarian cancer patient was referred to establish care with genetic counselor.  Patient eventually declined genetic testing.  12/30/2020, patient had a screening mammogram done which showed possible mass and asymmetry in the right breast. 01/08/2021, diagnostic mammogram of the right breast showed 2 adjacent irregular spiculated masses.  Larger 1 was 10 mm and a smaller mass measured 5 mm. 01/15/2021 stereotactic biopsy of the right breast smaller mass showed invasive carcinoma no special type, grade 2, DCIS present, LVI negative. Ultrasound guided biopsy of the larger mass showed invasive mammary carcinoma,   Patient has appointment with surgery Dr. Christian Barnes later this week.  She was referred to establish care with oncology for evaluation.  She was accompanied by daughter. She has no new complaints.  Family history of breast cancer: Denies Family history of other cancers: Patient has a strong family history of leukemia in her mother, colon cancer in her father at age of 51, ovarian cancer in her sister at age of 71, CLL in her brother  02/17/2021, patient is status post lumpectomy and sentinel lymph node biopsy Pathology showed invasive mammary carcinoma, 2 separate foci, DCIS, negative sentinel lymph node biopsy 0/1, grade 3, or margins negative for invasive carcinoma.  ER -, PR + [1-10%], HER2 +  pT1b(m) pN0   # Strong family history of cancer, Discussed again about genetic testing. patient adamantly declined.  #  Intermittent dizziness which per patient is chronic for her due to the vertigo. #03/23/2021 Mediport  by vascular surgeon #History of CHF, 03/08/2021, echocardiogram showed LVEF 60-65%. Per patient, she has been seen by Dr. [name] team and was cleared for  proceeding with chemotherapy.  Records is not available in current EMR.  INTERVAL HISTORY Amber Barnes is a 70 y.o. female who has above history reviewed by me today presents for follow up visit for management of right side HER 2 positive breast cancer S/p 2 cycles of Taxol and Transtuzumab 04/05/2021 - 04/09/2021 patient was admitted due to neutropenic fever with associated SIRS.  Patient was treated with empiric cefepime and vancomycin until date of discharge when Amber Barnes recovered to 1.4.  Post hospitalization, patient also had COVID-19 infection so her follow-up appointment was further postponed for another 2 weeks. Today she reports feeling okay.  No nausea vomiting diarrhea. Her blood pressure initially was low in the clinic.  She says that she has followed up with her cardiology. Was felt that the decreased blood pressure may be secondary to her weight loss recently.  It was suggested by her cardiology that if she persistently have low BP, losartan will be discontinued.   Today BP in our clinic was 102/70.  Patient denies any lightheadedness, fever or chills.   Review of Systems  Constitutional:  Positive for fatigue. Negative for appetite change, chills, fever and unexpected weight change.       Patient sits in the wheelchair  HENT:   Negative for hearing loss and voice change.   Eyes:  Negative for eye problems.  Respiratory:  Negative for chest tightness and cough.   Cardiovascular:  Negative for chest pain.  Gastrointestinal:  Negative for abdominal distention, abdominal pain, blood in stool, nausea and vomiting.  Endocrine: Negative for hot flashes.  Genitourinary:  Negative for difficulty urinating and frequency.   Musculoskeletal:  Negative for arthralgias.  Skin:  Negative for itching and rash.  Neurological:  Positive for numbness. Negative for dizziness and extremity weakness.  Hematological:  Negative for adenopathy.  Psychiatric/Behavioral:  Negative for confusion.     MEDICAL HISTORY:  Past Medical History:  Diagnosis Date   Achilles tendinitis    Acute medial meniscal tear    Allergy    Anxiety    Atrial fibrillation ([place])    a. new onset 07/2016; b. CHADS2VASc --> 4 (HTN, female, age, CHF); c. eliquis   CHF (congestive heart failure) (HCC)    Chronic anticoagulation    Apixaban   Chronic lower back pain    Colon polyp    Complication of anesthesia    Emergence delirium; hypoxia   COPD (chronic obstructive pulmonary disease) ([place])    Cystic breast    Depression    Endometriosis    Fibrocystic breast disease    GERD (gastroesophageal reflux disease)    History of stress test    a. 07/2016 MV: EF 55-65%. Small, mild defect  in mid anteroseptal, apical septal, and apical locations. No ischemia-->low risk.   Hyperlipidemia    Hypertension    Insomnia    Lipoma    Malignant neoplasm of upper-outer quadrant of right female breast (HCC)    Stage 1A invasive mammary carcinoma (cT1b or T1c N0); ER (-), PR (+), HER2/neu (+)   Migraine    Mild Mitral regurgitation    a. 07/2016 Echo: EF 55-65%, no rwma, mild MR. Mildly dil LA. Nl RV fx.    OSA (obstructive sleep apnea)    non-compliant with nocturnal PAP therapy   Osteoarthritis    left knee  Osteopenia    Peptic ulcer disease    a. H. pylori   Pneumonia    RLS (restless legs syndrome)    Tendonitis    Thyroid disease    Vitamin D deficiency     SURGICAL HISTORY: Past Surgical History:  Procedure Laterality Date   ABDOMINAL HYSTERECTOMY     ovaries left   ACHILLES TENDON SURGERY     2012,01/2017   BACK SURGERY     fusion lumbar   BREAST BIOPSY Right 01/15/2021   Affirm biopsy-"X" clip-INVASIVE MAMMARY CARCINOMA   BREAST BIOPSY Right 01/15/2021   u/s bx-"venus" clip INVASIVE MAMMARY CARCINOMA   BREAST BIOPSY     BREAST LUMPECTOMY,RADIO FREQ LOCALIZER,AXILLARY SENTINEL LYMPH NODE BIOPSY Right 02/17/2021   Procedure: BREAST LUMPECTOMY,RADIO FREQ LOCALIZER,AXILLARY SENTINEL LYMPH  NODE BIOPSY;  Surgeon: Ronny Bacon, MD;  Location: [facility];  Service: General;  Laterality: Right;   CARDIOVERSION N/A 09/07/2016   Procedure: CARDIOVERSION;  Surgeon: Minna Merritts, MD;  Location: [facility];  Service: Cardiovascular;  Laterality: N/A;   CARDIOVERSION N/A 10/19/2016   Procedure: CARDIOVERSION;  Surgeon: Minna Merritts, MD;  Location: [facility];  Service: Cardiovascular;  Laterality: N/A;   CARDIOVERSION N/A 12/14/2016   Procedure: CARDIOVERSION;  Surgeon: Minna Merritts, MD;  Location: [facility];  Service: Cardiovascular;  Laterality: N/A;   COLONOSCOPY     COLONOSCOPY WITH PROPOFOL N/A 05/13/2020   Procedure: COLONOSCOPY WITH PROPOFOL;  Surgeon: Toledo, Benay Pike, MD;  Location: [facility];  Service: Gastroenterology;  Laterality: N/A;  ELIQUIS   ESOPHAGOGASTRODUODENOSCOPY     ESOPHAGOGASTRODUODENOSCOPY (EGD) WITH PROPOFOL N/A 05/13/2020   Procedure: ESOPHAGOGASTRODUODENOSCOPY (EGD) WITH PROPOFOL;  Surgeon: Toledo, Benay Pike, MD;  Location: [facility];  Service: Gastroenterology;  Laterality: N/A;   FOOT SURGERY Left    tendon   JOINT REPLACEMENT Right    hip   PORTA CATH INSERTION N/A 03/23/2021   Procedure: PORTA CATH INSERTION;  Surgeon: Katha Cabal, MD;  Location: [facility];  Service: Cardiovascular;  Laterality: N/A;   PORTACATH PLACEMENT N/A 03/23/2021   Procedure: ATTEMPTED INSERTION PORT-A-CATH;  Surgeon: Ronny Bacon, MD;  Location: [facility];  Service: General;  Laterality: N/A;   THYROIDECTOMY     thyroid lobectomy secondary to a benign nodule   TOTAL HIP ARTHROPLASTY Right 08/21/2018   Procedure: TOTAL HIP ARTHROPLASTY ANTERIOR APPROACH-RIGHT CELL SAVER REQUESTED;  Surgeon: [name], MD;  Location: [facility];  Service: Orthopedics;  Laterality: Right;    SOCIAL HISTORY: Social History   Socioeconomic History   Marital status: Married    Spouse name: jeffrey   Number of children: 1   Years of education: Not on  file   Highest education level: High school graduate  Occupational History    Comment: disabled  Tobacco Use   Smoking status: Former    Packs/day: 0.25    Years: 10.00    Pack years: 2.50    Types: Cigarettes    Quit date: 2005    Years since quitting: 17.8   Smokeless tobacco: Never   Tobacco comments:    smoked for 10 years, 1 pack every 2-3 days  Vaping Use   Vaping Use: Never used  Substance and Sexual Activity   Alcohol use: Not Currently    Alcohol/week: 1.0 standard drink    Types: 1 Glasses of wine per week    Comment: rarely   Drug use: No   Sexual activity: Not Currently  Other Topics Concern   Not on  file  Social History Narrative   Moved to [place] from [place], originally from [place].    1 cup of caffeine daily   Right handed          Social Determinants of Health   Financial Resource Strain: Low Risk    Difficulty of Paying Living Expenses: Not very hard  Food Insecurity: No Food Insecurity   Worried About Charity fundraiser in the Last Year: Never true   Ran Out of Food in the Last Year: Never true  Transportation Needs: No Transportation Needs   Lack of Transportation (Medical): No   Lack of Transportation (Non-Medical): No  Physical Activity: Unknown   Days of Exercise per Week: 0 days   Minutes of Exercise per Session: Not on file  Stress: Stress Concern Present   Feeling of Stress : Rather much  Social Connections: Moderately Isolated   Frequency of Communication with Friends and Family: Never   Frequency of Social Gatherings with Friends and Family: Once a week   Attends Religious Services: More than 4 times per year   Active Member of [employer] or Organizations: No   Attends Music therapist: Never   Marital Status: Married  Human resources officer Violence: Not At Risk   Fear of Current or Ex-Partner: No   Emotionally Abused: No   Physically Abused: No   Sexually Abused: No    FAMILY HISTORY: Family  History  Problem Relation Age of Onset   Cancer Sister 83       ovarian   Depression Sister    Cancer Brother        CLL   Cancer Father 15       colon   Heart disease Father    Heart attack Father 34       died from MI   Hyperlipidemia Father    Hypertension Father    Cancer Mother        leukemia   Breast cancer Paternal Aunt        84's   Migraines Neg Hx     ALLERGIES:  is allergic to prednisone, amiodarone, hydrocodone-acetaminophen, oxycodone, tapentadol, tramadol, adhesive [tape], and latex.  MEDICATIONS:  Current Outpatient Medications  Medication Sig Dispense Refill   acetaminophen (TYLENOL) 500 MG tablet Take 500 mg by mouth every 6 (six) hours as needed. Taking as needed     albuterol (VENTOLIN HFA) 108 (90 Base) MCG/ACT inhaler INHALE 2 PUFFS INTO THE LUNGS EVERY 8 (EIGHT) HOURS AS NEEDED FOR WHEEZING OR SHORTNESS OF BREATH. 6.7 g 0   azelastine (ASTELIN) 0.1 % nasal spray Place into both nostrils as needed for rhinitis. Use in each nostril as directed     B Complex Vitamins (VITAMIN B COMPLEX PO) Take 1 tablet by mouth daily.      busPIRone (BUSPAR) 5 MG tablet TAKE 1 TABLET BY MOUTH THREE TIMES A DAY AS NEEDED 60 tablet 1   calcium gluconate 500 MG tablet Take 500 tablets by mouth daily.     calcium-vitamin D (OSCAL WITH D) 500-200 MG-UNIT tablet Take 1 tablet by mouth daily with breakfast.     Cholecalciferol (VITAMIN D3) 5000 units CAPS Take 5,000 Units by mouth daily.     DULoxetine (CYMBALTA) 30 MG capsule Take 1 capsule (30 mg total) by mouth 2 (two) times daily. Home med.     ELIQUIS 5 MG TABS tablet TAKE 1 TABLET BY MOUTH TWICE A DAY 180 tablet 1   esomeprazole (NEXIUM)  20 MG capsule Take 20 mg by mouth as needed.     furosemide (LASIX) 20 MG tablet Take 1 tablet (20 mg total) by mouth 2 (two) times daily. 180 tablet 0   lidocaine-prilocaine (EMLA) cream Apply to affected area once 30 g 3   LORazepam (ATIVAN) 0.5 MG tablet Take 1 tablet (0.5 mg total) by  mouth every 8 (eight) hours as needed for anxiety (nausea/ vomiting). 60 tablet 0   losartan (COZAAR) 25 MG tablet Take 1 tablet (25 mg total) by mouth daily. 90 tablet 3   Multiple Vitamin (MULTIVITAMIN) tablet Take 1 tablet by mouth daily.     ondansetron (ZOFRAN) 8 MG tablet Take 1 tablet (8 mg total) by mouth every 8 (eight) hours as needed for nausea or vomiting. 60 tablet 0   potassium chloride SA (KLOR-CON) 20 MEQ tablet Take 1 tablet (20 mEq total) by mouth 2 (two) times daily. 120 tablet 2   ranolazine (RANEXA) 500 MG 12 hr tablet TAKE 1 TABLET BY MOUTH TWICE A DAY 180 tablet 0   rizatriptan (MAXALT) 10 MG tablet TAKE 1 TABLET (10 MG TOTAL) BY MOUTH AS NEEDED FOR MIGRAINE. MAY REPEAT IN 2 HOURS IF NEEDED 10 tablet 0   rOPINIRole (REQUIP) 0.5 MG tablet Take 1 tablet (0.5 mg total) by mouth at bedtime. 90 tablet 0   rOPINIRole (REQUIP) 1 MG tablet TAKE 1 TABLET BY MOUTH AT BEDTIME. 90 tablet 1   rosuvastatin (CRESTOR) 20 MG tablet TAKE 1 TABLET BY MOUTH EVERY DAY 90 tablet 0   bisoprolol (ZEBETA) 5 MG tablet Take 1 tablet (5 mg total) by mouth 2 (two) times daily. 180 tablet 1   predniSONE (DELTASONE) 20 MG tablet Take 2 tablets (40 mg total) by mouth daily. (Patient not taking: No sig reported) 10 tablet 0   prochlorperazine (COMPAZINE) 10 MG tablet Take 1 tablet (10 mg total) by mouth every 6 (six) hours as needed (Nausea or vomiting). (Patient not taking: No sig reported) 30 tablet 1   promethazine (PHENERGAN) 25 MG tablet Take 1 tablet (25 mg total) by mouth every 6 (six) hours as needed for nausea or vomiting. (Patient not taking: No sig reported) 60 tablet 0   No current facility-administered medications for this visit.     PHYSICAL EXAMINATION: ECOG PERFORMANCE STATUS: 2 - Symptomatic, <50% confined to bed Vitals:   05/10/21 0955 05/10/21 1037  BP: (!) 86/50 102/70  Pulse: 80 66  Temp: (!) 96.9 F (36.1 C)   SpO2: 98%    Filed Weights   05/10/21 0955  Weight: 278 lb 1.6  oz (126.1 kg)    Physical Exam Constitutional:      General: She is not in acute distress.    Appearance: She is obese.  HENT:     Head: Normocephalic and atraumatic.  Eyes:     General: No scleral icterus. Cardiovascular:     Rate and Rhythm: Normal rate and regular rhythm.     Heart sounds: Normal heart sounds.  Pulmonary:     Effort: Pulmonary effort is normal. No respiratory distress.     Breath sounds: No wheezing.  Abdominal:     General: Bowel sounds are normal. There is no distension.     Palpations: Abdomen is soft.  Musculoskeletal:        General: No deformity. Normal range of motion.     Cervical back: Normal range of motion and neck supple.  Skin:    General: Skin is warm and dry.  Findings: No erythema or rash.  Neurological:     Mental Status: She is alert and oriented to person, place, and time. Mental status is at baseline.     Cranial Nerves: No cranial nerve deficit.     Coordination: Coordination normal.  Psychiatric:        Mood and Affect: Mood normal.    . LABORATORY DATA:  I have reviewed the data as listed Lab Results  Component Value Date   WBC 5.8 05/10/2021   HGB 11.7 (L) 05/10/2021   HCT 36.3 05/10/2021   MCV 95.0 05/10/2021   PLT 150 05/10/2021   Recent Labs    03/31/21 0856 04/05/21 1738 04/06/21 0625 04/08/21 0539 04/09/21 0440 05/10/21 0920  NA 135 136   < > 137 137 139  K 3.5 3.8   < > 3.6 3.6 3.4*  CL 100 103   < > 106 107 102  CO2 26 25   < > 25 24 26   GLUCOSE 117* 117*   < > 94 88 111*  BUN 22 17   < > 7* 7* 12  CREATININE 1.07* 0.98   < > 0.70 0.88 0.87  CALCIUM 8.3* 8.3*   < > 7.9* 7.6* 8.4*  GFRNONAA 57* >60   < > >60 >60 >60  PROT 7.0 6.9  --   --   --  6.4*  ALBUMIN 3.7 3.7  --   --   --  3.4*  AST 20 20  --   --   --  19  ALT 20 22  --   --   --  16  ALKPHOS 69 69  --   --   --  65  BILITOT 0.9 1.4*  --   --   --  0.7   < > = values in this interval not displayed.    Iron/TIBC/Ferritin/ %Sat     Component Value Date/Time   IRON 72 03/31/2021 0856   TIBC 364 03/31/2021 0856   FERRITIN 291 03/31/2021 0856   IRONPCTSAT 20 03/31/2021 0856      RADIOGRAPHIC STUDIES: I have personally reviewed the radiological images as listed and agreed with the findings in the report. No results found.    ASSESSMENT & PLAN:  1. HER2-positive carcinoma of breast ([place])   2. Encounter for antineoplastic chemotherapy   3. Neuropathy   4. Chemotherapy induced nausea and vomiting   Cancer Staging Malignant neoplasm of upper-outer quadrant of right female breast [facility]) Staging form: Breast, AJCC 8th Edition - Pathologic stage from 03/03/2021: Stage IA (pT1b, pN0, cM0, G3, ER-, PR+, HER2+) - Signed by Amber Server, MD on 03/03/2021  #Right breast invasive carcinoma pT1b pN0 ER negative, PR weakly HER2 positive positive, HER 2 positive.  Labs reviewed and discussed with patient To proceed with trastuzumab and Taxol with dose reduction-56m/m2 Decrease premed benadryl to 235mdue to restless leg syndrome  # chemotherapy induced nausea and vomiting.  Today she has no symptoms. Zofran 81m50m8hour PRN.  Ativan 0.5mg61mh PRN nasuea/vomiting/anxiety. Hold Xanax.   Patient has history of CHF, Repeat echocardiogram showed normal LVEF. Follow-up with cardiologist Dr. GollRockey Barnes Marland KitchenPre-existing neuropathy in her fingertips bilaterally-Per patient this is secondary to cervical spine radiculopathy Patient is made aware that neuropathy may get worse secondary to chemotherapy  # she declined genetic testing  Supportive care measures are necessary for patient well-being and will be provided as necessary. We spent sufficient time to discuss many aspect of care, questions  were answered to patient's satisfaction.   All questions were answered. The patient knows to call the clinic with any problems questions or concerns.  cc Amber Hawthorne, FNP  Follow-up 1 week, Taxol and trastuzumab.   Amber Server, MD,  PhD 05/10/2021

## 2021-05-11 ENCOUNTER — Telehealth: Payer: Self-pay | Admitting: *Deleted

## 2021-05-11 ENCOUNTER — Encounter: Payer: Self-pay | Admitting: Oncology

## 2021-05-11 ENCOUNTER — Other Ambulatory Visit: Payer: Self-pay | Admitting: Oncology

## 2021-05-11 MED ORDER — NYSTATIN 100000 UNIT/GM EX POWD: 1.0000 "application " | Freq: Three times a day (TID) | CUTANEOUS | 1 refills | Status: DC

## 2021-05-11 NOTE — Telephone Encounter (Signed)
Patient called reporting that Dr Tasia Catchings said she was going to send prescription powder for a yeast infection under her arms to [facility] [employer] yesterday. I do not see mention of this in the note from yesterday and nothing was ordered. Please advise

## 2021-05-18 ENCOUNTER — Inpatient Hospital Stay: Payer: Medicare Other | Attending: Oncology

## 2021-05-18 ENCOUNTER — Inpatient Hospital Stay (HOSPITAL_BASED_OUTPATIENT_CLINIC_OR_DEPARTMENT_OTHER): Payer: Medicare Other | Admitting: Oncology

## 2021-05-18 ENCOUNTER — Inpatient Hospital Stay: Payer: Medicare Other

## 2021-05-18 ENCOUNTER — Other Ambulatory Visit: Payer: Self-pay

## 2021-05-18 ENCOUNTER — Encounter: Payer: Self-pay | Admitting: Oncology

## 2021-05-18 VITALS — BP 142/75 | HR 53 | Temp 96.6°F | Resp 18 | Wt 277.8 lb

## 2021-05-18 DIAGNOSIS — T451X5A Adverse effect of antineoplastic and immunosuppressive drugs, initial encounter: Secondary | ICD-10-CM | POA: Diagnosis not present

## 2021-05-18 DIAGNOSIS — C50919 Malignant neoplasm of unspecified site of unspecified female breast: Secondary | ICD-10-CM | POA: Diagnosis not present

## 2021-05-18 DIAGNOSIS — R112 Nausea with vomiting, unspecified: Secondary | ICD-10-CM | POA: Insufficient documentation

## 2021-05-18 DIAGNOSIS — Z171 Estrogen receptor negative status [ER-]: Secondary | ICD-10-CM | POA: Insufficient documentation

## 2021-05-18 DIAGNOSIS — G629 Polyneuropathy, unspecified: Secondary | ICD-10-CM | POA: Diagnosis not present

## 2021-05-18 DIAGNOSIS — Z79899 Other long term (current) drug therapy: Secondary | ICD-10-CM | POA: Diagnosis not present

## 2021-05-18 DIAGNOSIS — Z5111 Encounter for antineoplastic chemotherapy: Secondary | ICD-10-CM | POA: Diagnosis not present

## 2021-05-18 DIAGNOSIS — Z8041 Family history of malignant neoplasm of ovary: Secondary | ICD-10-CM | POA: Diagnosis not present

## 2021-05-18 DIAGNOSIS — I509 Heart failure, unspecified: Secondary | ICD-10-CM | POA: Diagnosis not present

## 2021-05-18 DIAGNOSIS — C50411 Malignant neoplasm of upper-outer quadrant of right female breast: Secondary | ICD-10-CM | POA: Diagnosis not present

## 2021-05-18 DIAGNOSIS — G2581 Restless legs syndrome: Secondary | ICD-10-CM | POA: Diagnosis not present

## 2021-05-18 DIAGNOSIS — D6481 Anemia due to antineoplastic chemotherapy: Secondary | ICD-10-CM | POA: Insufficient documentation

## 2021-05-18 HISTORY — DX: Adverse effect of antineoplastic and immunosuppressive drugs, initial encounter: T45.1X5A

## 2021-05-18 LAB — CBC WITH DIFFERENTIAL/PLATELET
Abs Immature Granulocytes: 0.01 10*3/uL (ref 0.00–0.07)
Basophils Absolute: 0 10*3/uL (ref 0.0–0.1)
Basophils Relative: 1 %
Eosinophils Absolute: 0.1 10*3/uL (ref 0.0–0.5)
Eosinophils Relative: 3 %
HCT: 32.7 % — ABNORMAL LOW (ref 36.0–46.0)
Hemoglobin: 10.7 g/dL — ABNORMAL LOW (ref 12.0–15.0)
Immature Granulocytes: 0 %
Lymphocytes Relative: 35 %
Lymphs Abs: 1.3 10*3/uL (ref 0.7–4.0)
MCH: 31.5 pg (ref 26.0–34.0)
MCHC: 32.7 g/dL (ref 30.0–36.0)
MCV: 96.2 fL (ref 80.0–100.0)
Monocytes Absolute: 0.1 10*3/uL (ref 0.1–1.0)
Monocytes Relative: 4 %
Neutro Abs: 2.1 10*3/uL (ref 1.7–7.7)
Neutrophils Relative %: 57 %
Platelets: 144 10*3/uL — ABNORMAL LOW (ref 150–400)
RBC: 3.4 MIL/uL — ABNORMAL LOW (ref 3.87–5.11)
RDW: 14.7 % (ref 11.5–15.5)
WBC: 3.7 10*3/uL — ABNORMAL LOW (ref 4.0–10.5)
nRBC: 0 % (ref 0.0–0.2)

## 2021-05-18 LAB — COMPREHENSIVE METABOLIC PANEL
ALT: 16 U/L (ref 0–44)
AST: 17 U/L (ref 15–41)
Albumin: 3.5 g/dL (ref 3.5–5.0)
Alkaline Phosphatase: 67 U/L (ref 38–126)
Anion gap: 7 (ref 5–15)
BUN: 11 mg/dL (ref 8–23)
CO2: 29 mmol/L (ref 22–32)
Calcium: 8.4 mg/dL — ABNORMAL LOW (ref 8.9–10.3)
Chloride: 103 mmol/L (ref 98–111)
Creatinine, Ser: 0.88 mg/dL (ref 0.44–1.00)
GFR, Estimated: >60 mL/min (ref >60)
Glucose, Bld: 91 mg/dL (ref 70–99)
Potassium: 3.9 mmol/L (ref 3.5–5.1)
Sodium: 139 mmol/L (ref 135–145)
Total Bilirubin: 0.8 mg/dL (ref 0.3–1.2)
Total Protein: 6.6 g/dL (ref 6.5–8.1)

## 2021-05-18 MED ORDER — ACETAMINOPHEN 325 MG PO TABS
650.0000 mg | ORAL_TABLET | Freq: Once | ORAL | Status: AC
  Administered 2021-05-18: 650 mg via ORAL
  Filled 2021-05-18: qty 2

## 2021-05-18 MED ORDER — HEPARIN SOD (PORK) LOCK FLUSH 100 UNIT/ML IV SOLN
500.0000 [IU] | Freq: Once | INTRAVENOUS | Status: DC
  Filled 2021-05-18: qty 5

## 2021-05-18 MED ORDER — DIPHENHYDRAMINE HCL 50 MG/ML IJ SOLN
25.0000 mg | Freq: Once | INTRAMUSCULAR | Status: AC
  Administered 2021-05-18: 25 mg via INTRAVENOUS
  Filled 2021-05-18: qty 1

## 2021-05-18 MED ORDER — FAMOTIDINE 20 MG IN NS 100 ML IVPB
20.0000 mg | Freq: Once | INTRAVENOUS | Status: AC
  Administered 2021-05-18: 20 mg via INTRAVENOUS
  Filled 2021-05-18: qty 20
  Filled 2021-05-18: qty 100

## 2021-05-18 MED ORDER — SODIUM CHLORIDE 0.9% FLUSH
10.0000 mL | INTRAVENOUS | Status: DC | PRN
  Filled 2021-05-18: qty 10

## 2021-05-18 MED ORDER — HEPARIN SOD (PORK) LOCK FLUSH 100 UNIT/ML IV SOLN
INTRAVENOUS | Status: AC
  Administered 2021-05-18: 500 [IU]
  Filled 2021-05-18: qty 5

## 2021-05-18 MED ORDER — SODIUM CHLORIDE 0.9 % IV SOLN
Freq: Once | INTRAVENOUS | Status: AC
  Filled 2021-05-18: qty 250

## 2021-05-18 MED ORDER — HEPARIN SOD (PORK) LOCK FLUSH 100 UNIT/ML IV SOLN
500.0000 [IU] | Freq: Once | INTRAVENOUS | Status: AC | PRN
  Filled 2021-05-18: qty 5

## 2021-05-18 MED ORDER — SODIUM CHLORIDE 0.9 % IV SOLN
10.0000 mg | Freq: Once | INTRAVENOUS | Status: AC
  Administered 2021-05-18: 10 mg via INTRAVENOUS
  Filled 2021-05-18: qty 10

## 2021-05-18 MED ORDER — TRASTUZUMAB-ANNS CHEMO 150 MG IV SOLR
2.0000 mg/kg | Freq: Once | INTRAVENOUS | Status: AC
  Administered 2021-05-18: 252 mg via INTRAVENOUS
  Filled 2021-05-18: qty 12

## 2021-05-18 MED ORDER — SODIUM CHLORIDE 0.9% FLUSH
10.0000 mL | INTRAVENOUS | Status: DC | PRN
  Administered 2021-05-18: 10 mL via INTRAVENOUS
  Filled 2021-05-18: qty 10

## 2021-05-18 MED ORDER — SODIUM CHLORIDE 0.9 % IV SOLN
65.0000 mg/m2 | Freq: Once | INTRAVENOUS | Status: AC
  Administered 2021-05-18: 156 mg via INTRAVENOUS
  Filled 2021-05-18: qty 26

## 2021-05-18 NOTE — Patient Instructions (Signed)
[facility]  Discharge Instructions: Thank you for choosing [facility] to provide your oncology and hematology care.  If you have a lab appointment with the [facility], please go directly to the [facility] and check in at the registration area.  Wear comfortable clothing and clothing appropriate for easy access to any Portacath or PICC line.   We strive to give you quality time with your provider. You may need to reschedule your appointment if you arrive late (15 or more minutes).  Arriving late affects you and other patients whose appointments are after yours.  Also, if you miss three or more appointments without notifying the office, you may be dismissed from the clinic at the provider's discretion.      For prescription refill requests, have your pharmacy contact our office and allow 72 hours for refills to be completed.    Today you received the following chemotherapy and/or immunotherapy agents - trastuzumab, paclitaxel      To help prevent nausea and vomiting after your treatment, we encourage you to take your nausea medication as directed.  BELOW ARE SYMPTOMS THAT SHOULD BE REPORTED IMMEDIATELY: *FEVER GREATER THAN 100.4 F (38 C) OR HIGHER *CHILLS OR SWEATING *NAUSEA AND VOMITING THAT IS NOT CONTROLLED WITH YOUR NAUSEA MEDICATION *UNUSUAL SHORTNESS OF BREATH *UNUSUAL BRUISING OR BLEEDING *URINARY PROBLEMS (pain or burning when urinating, or frequent urination) *BOWEL PROBLEMS (unusual diarrhea, constipation, pain near the anus) TENDERNESS IN MOUTH AND THROAT WITH OR WITHOUT PRESENCE OF ULCERS (sore throat, sores in mouth, or a toothache) UNUSUAL RASH, SWELLING OR PAIN  UNUSUAL VAGINAL DISCHARGE OR ITCHING   Items with * indicate a potential emergency and should be followed up as soon as possible or go to the Emergency Department if any problems should occur.  Please show the CHEMOTHERAPY ALERT CARD or IMMUNOTHERAPY ALERT  CARD at check-in to the Emergency Department and triage nurse.  Should you have questions after your visit or need to cancel or reschedule your appointment, please contact [facility]  [phone] and follow the prompts.  Office hours are 8:00 a.m. to 4:30 p.m. Monday - Friday. Please note that voicemails left after 4:00 p.m. may not be returned until the following business day.  We are closed weekends and major holidays. You have access to a nurse at all times for urgent questions. Please call the main number to the clinic [phone] and follow the prompts.  For any non-urgent questions, you may also contact your provider using MyChart. We now offer e-Visits for anyone 43 and older to request care online for non-urgent symptoms. For details visit [email or web address].   Also download the MyChart app! Go to the app store, search "MyChart", open the app, select [facility], and log in with your MyChart username and password.  Due to Covid, a mask is required upon entering the hospital/clinic. If you do not have a mask, one will be given to you upon arrival. For doctor visits, patients may have 1 support person aged 36 or older with them. For treatment visits, patients cannot have anyone with them due to current Covid guidelines and our immunocompromised population.   Trastuzumab injection for infusion What is this medication? TRASTUZUMAB (tras TOO zoo mab) is a monoclonal antibody. It is used to treat breast cancer and stomach cancer. This medicine may be used for other purposes; ask your health care provider or pharmacist if you have questions. COMMON BRAND NAME(S): Herceptin, Belenda Cruise, Ogivri,  Chalmers Guest What should I tell my care team before I take this medication? They need to know if you have any of these conditions: heart disease heart failure lung or breathing disease, like asthma an unusual or allergic reaction to trastuzumab,  benzyl alcohol, or other medications, foods, dyes, or preservatives pregnant or trying to get pregnant breast-feeding How should I use this medication? This drug is given as an infusion into a vein. It is administered in a hospital or clinic by a specially trained health care professional. Talk to your pediatrician regarding the use of this medicine in children. This medicine is not approved for use in children. Overdosage: If you think you have taken too much of this medicine contact a poison control center or emergency room at once. NOTE: This medicine is only for you. Do not share this medicine with others. What if I miss a dose? It is important not to miss a dose. Call your doctor or health care professional if you are unable to keep an appointment. What may interact with this medication? This medicine may interact with the following medications: certain types of chemotherapy, such as daunorubicin, doxorubicin, epirubicin, and idarubicin This list may not describe all possible interactions. Give your health care provider a list of all the medicines, herbs, non-prescription drugs, or dietary supplements you use. Also tell them if you smoke, drink alcohol, or use illegal drugs. Some items may interact with your medicine. What should I watch for while using this medication? Visit your doctor for checks on your progress. Report any side effects. Continue your course of treatment even though you feel ill unless your doctor tells you to stop. Call your doctor or health care professional for advice if you get a fever, chills or sore throat, or other symptoms of a cold or flu. Do not treat yourself. Try to avoid being around people who are sick. You may experience fever, chills and shaking during your first infusion. These effects are usually mild and can be treated with other medicines. Report any side effects during the infusion to your health care professional. Fever and chills usually do not happen  with later infusions. Do not become pregnant while taking this medicine or for 7 months after stopping it. Women should inform their doctor if they wish to become pregnant or think they might be pregnant. Women of child-bearing potential will need to have a negative pregnancy test before starting this medicine. There is a potential for serious side effects to an unborn child. Talk to your health care professional or pharmacist for more information. Do not breast-feed an infant while taking this medicine or for 7 months after stopping it. Women must use effective birth control with this medicine. What side effects may I notice from receiving this medication? Side effects that you should report to your doctor or health care professional as soon as possible: allergic reactions like skin rash, itching or hives, swelling of the face, lips, or tongue chest pain or palpitations cough dizziness feeling faint or lightheaded, falls fever general ill feeling or flu-like symptoms signs of worsening heart failure like breathing problems; swelling in your legs and feet unusually weak or tired Side effects that usually do not require medical attention (report to your doctor or health care professional if they continue or are bothersome): bone pain changes in taste diarrhea joint pain nausea/vomiting weight loss This list may not describe all possible side effects. Call your doctor for medical advice about side effects. You may report side  effects to FDA at 1-800-FDA-1088. Where should I keep my medication? This drug is given in a hospital or clinic and will not be stored at home. NOTE: This sheet is a summary. It may not cover all possible information. If you have questions about this medicine, talk to your doctor, pharmacist, or health care provider.  2022 Elsevier/Gold Standard (2016-07-12 00:00:00)  Paclitaxel injection What is this medication? PACLITAXEL (PAK li TAX el) is a chemotherapy drug. It  targets fast dividing cells, like cancer cells, and causes these cells to die. This medicine is used to treat ovarian cancer, breast cancer, lung cancer, Kaposi's sarcoma, and other cancers. This medicine may be used for other purposes; ask your health care provider or pharmacist if you have questions. COMMON BRAND NAME(S): Onxol, Taxol What should I tell my care team before I take this medication? They need to know if you have any of these conditions: history of irregular heartbeat liver disease low blood counts, like low white cell, platelet, or red cell counts lung or breathing disease, like asthma tingling of the fingers or toes, or other nerve disorder an unusual or allergic reaction to paclitaxel, alcohol, polyoxyethylated castor oil, other chemotherapy, other medicines, foods, dyes, or preservatives pregnant or trying to get pregnant breast-feeding How should I use this medication? This drug is given as an infusion into a vein. It is administered in a hospital or clinic by a specially trained health care professional. Talk to your pediatrician regarding the use of this medicine in children. Special care may be needed. Overdosage: If you think you have taken too much of this medicine contact a poison control center or emergency room at once. NOTE: This medicine is only for you. Do not share this medicine with others. What if I miss a dose? It is important not to miss your dose. Call your doctor or health care professional if you are unable to keep an appointment. What may interact with this medication? Do not take this medicine with any of the following medications: live virus vaccines This medicine may also interact with the following medications: antiviral medicines for hepatitis, HIV or AIDS certain antibiotics like erythromycin and clarithromycin certain medicines for fungal infections like ketoconazole and itraconazole certain medicines for seizures like carbamazepine,  phenobarbital, phenytoin gemfibrozil nefazodone rifampin St. John's wort This list may not describe all possible interactions. Give your health care provider a list of all the medicines, herbs, non-prescription drugs, or dietary supplements you use. Also tell them if you smoke, drink alcohol, or use illegal drugs. Some items may interact with your medicine. What should I watch for while using this medication? Your condition will be monitored carefully while you are receiving this medicine. You will need important blood work done while you are taking this medicine. This medicine can cause serious allergic reactions. To reduce your risk you will need to take other medicine(s) before treatment with this medicine. If you experience allergic reactions like skin rash, itching or hives, swelling of the face, lips, or tongue, tell your doctor or health care professional right away. In some cases, you may be given additional medicines to help with side effects. Follow all directions for their use. This drug may make you feel generally unwell. This is not uncommon, as chemotherapy can affect healthy cells as well as cancer cells. Report any side effects. Continue your course of treatment even though you feel ill unless your doctor tells you to stop. Call your doctor or health care professional for advice if  you get a fever, chills or sore throat, or other symptoms of a cold or flu. Do not treat yourself. This drug decreases your body's ability to fight infections. Try to avoid being around people who are sick. This medicine may increase your risk to bruise or bleed. Call your doctor or health care professional if you notice any unusual bleeding. Be careful brushing and flossing your teeth or using a toothpick because you may get an infection or bleed more easily. If you have any dental work done, tell your dentist you are receiving this medicine. Avoid taking products that contain aspirin, acetaminophen,  ibuprofen, naproxen, or ketoprofen unless instructed by your doctor. These medicines may hide a fever. Do not become pregnant while taking this medicine. Women should inform their doctor if they wish to become pregnant or think they might be pregnant. There is a potential for serious side effects to an unborn child. Talk to your health care professional or pharmacist for more information. Do not breast-feed an infant while taking this medicine. Men are advised not to father a child while receiving this medicine. This product may contain alcohol. Ask your pharmacist or healthcare provider if this medicine contains alcohol. Be sure to tell all healthcare providers you are taking this medicine. Certain medicines, like metronidazole and disulfiram, can cause an unpleasant reaction when taken with alcohol. The reaction includes flushing, headache, nausea, vomiting, sweating, and increased thirst. The reaction can last from 30 minutes to several hours. What side effects may I notice from receiving this medication? Side effects that you should report to your doctor or health care professional as soon as possible: allergic reactions like skin rash, itching or hives, swelling of the face, lips, or tongue breathing problems changes in vision fast, irregular heartbeat high or low blood pressure mouth sores pain, tingling, numbness in the hands or feet signs of decreased platelets or bleeding - bruising, pinpoint red spots on the skin, black, tarry stools, blood in the urine signs of decreased red blood cells - unusually weak or tired, feeling faint or lightheaded, falls signs of infection - fever or chills, cough, sore throat, pain or difficulty passing urine signs and symptoms of liver injury like dark yellow or brown urine; general ill feeling or flu-like symptoms; light-colored stools; loss of appetite; nausea; right upper belly pain; unusually weak or tired; yellowing of the eyes or skin swelling of the  ankles, feet, hands unusually slow heartbeat Side effects that usually do not require medical attention (report to your doctor or health care professional if they continue or are bothersome): diarrhea hair loss loss of appetite muscle or joint pain nausea, vomiting pain, redness, or irritation at site where injected tiredness This list may not describe all possible side effects. Call your doctor for medical advice about side effects. You may report side effects to FDA at 1-800-FDA-1088. Where should I keep my medication? This drug is given in a hospital or clinic and will not be stored at home. NOTE: This sheet is a summary. It may not cover all possible information. If you have questions about this medicine, talk to your doctor, pharmacist, or health care provider.  2022 Elsevier/Gold Standard (2021-03-16 00:00:00)

## 2021-05-18 NOTE — Progress Notes (Signed)
Hematology/Oncology Progress note [phone] [phone]   Patient Care Team: [name], FNP as PCP - General (Family Medicine) [name] [name], MD as PCP - Cardiology (Cardiology) Charletta Cousin, MD (Inactive) [name], [name], MD as Consulting Physician (Cardiology) Rico Junker, RN as Oncology Nurse Navigator [name], MD as Consulting Physician (Hematology and Oncology)  REFERRING PROVIDER: [name], FNP  CHIEF COMPLAINTS/REASON FOR VISIT:  Follow-up for stage I HER2 positive breast cancer  HISTORY OF PRESENTING ILLNESS:   Amber Barnes is a  70 y.o.  female with PMH listed below was seen in consultation at the request of  [name], FNP  for evaluation of weight loss, family history ovarian cancer. Patient reports a history of nausea vomiting, early satiety, dysphagia symptoms that started in March.  She has lost significant amount of weight as well. Patient weighed 340 pounds in February 2021, 309 pounds in May 2021 and currently she weighs 296 pounds today. Patient has tried Zofran and scopolamine patch which did not relieve her symptoms. 11/05/2019, CT abdomen pelvis showed chronic changes with out acute abnormality to correspond with the patient's symptoms. Patient has had right upper quadrant ultrasound done on 11/14/2019, which showed hepatic steatosis with no other acute abnormality. Patient was seen by gastroenterology Dr. [name] on 11/18/2019.  Patient was recommended to take PPI daily.  Added Carafate.  H. pylori testing came back positive and the patient was started on a course of tetracycline, metronidazole, Pepto-Bismol for H. pylori treatments.  Patient reports that she has been on the treatment for 7 days now.  Nausea is slightly better but still persistent.  She is following up with gastroenterology next week for discussion of EGD. Currently she is not able to tolerate colonoscopy prep due to the nausea.  Her stool  was tested positive for occult blood. She also reports night sweating which is a chronic symptom for her. Patient was last seen on 05/20/2020 for evaluation of weight loss, family history of ovarian cancer patient was referred to establish care with genetic counselor.  Patient eventually declined genetic testing.  12/30/2020, patient had a screening mammogram done which showed possible mass and asymmetry in the right breast. 01/08/2021, diagnostic mammogram of the right breast showed 2 adjacent irregular spiculated masses.  Larger 1 was 10 mm and a smaller mass measured 5 mm. 01/15/2021 stereotactic biopsy of the right breast smaller mass showed invasive carcinoma no special type, grade 2, DCIS present, LVI negative. Ultrasound guided biopsy of the larger mass showed invasive mammary carcinoma,   Patient has appointment with surgery Dr. [name] later this week.  She was referred to establish care with oncology for evaluation.  She was accompanied by daughter. She has no new complaints.  Family history of breast cancer: Denies Family history of other cancers: Patient has a strong family history of leukemia in her mother, colon cancer in her father at age of 55, ovarian cancer in her sister at age of 67, CLL in her brother  02/17/2021, patient is status post lumpectomy and sentinel lymph node biopsy Pathology showed invasive mammary carcinoma, 2 separate foci, DCIS, negative sentinel lymph node biopsy 0/1, grade 3, or margins negative for invasive carcinoma.  ER -, PR + [1-10%], HER2 +  pT1b(m) pN0   # Strong family history of cancer, Discussed again about genetic testing. patient adamantly declined.  #  Intermittent dizziness which per patient is chronic for her due to the vertigo. #03/23/2021 Mediport by vascular surgeon #History  of CHF, 03/08/2021, echocardiogram showed LVEF 60-65%. Per patient, she has been seen by Dr. [name] team and was cleared for proceeding with chemotherapy.   03/24/2021  - 03/31/2021 Taxol 80 mg/m2 [name] 04/05/2021 - 04/09/2021 patient was admitted due to neutropenic fever with associated SIRS.  Patient was treated with empiric cefepime and vancomycin until date of discharge when [place] recovered to 1.4.  Post hospitalization, patient also had COVID-19 infection so her follow-up appointment was further postponed for another 2 weeks.  05/10/2021 resumed dose reduce Taxol 31m/m2 /[name] INTERVAL HISTORY JMarybelle Giraldois a 70y.o. female who has above history reviewed by me today presents for follow up visit for management of right side HER 2 positive breast cancer Patient tolerated dose reduced chemotherapy well.  Denies any nausea vomiting diarrhea, fever or chills.  Slightly more fatigued recently.  She was accompanied by female family member.  Pre-existing neuropathy is not worse.   Review of Systems  Constitutional:  Positive for fatigue. Negative for appetite change, chills, fever and unexpected weight change.       Patient sits in the wheelchair  HENT:   Negative for hearing loss and voice change.   Eyes:  Negative for eye problems.  Respiratory:  Negative for chest tightness and cough.   Cardiovascular:  Negative for chest pain.  Gastrointestinal:  Negative for abdominal distention, abdominal pain, blood in stool, nausea and vomiting.  Endocrine: Negative for hot flashes.  Genitourinary:  Negative for difficulty urinating and frequency.   Musculoskeletal:  Negative for arthralgias.  Skin:  Negative for itching and rash.  Neurological:  Positive for numbness. Negative for dizziness and extremity weakness.  Hematological:  Negative for adenopathy.  Psychiatric/Behavioral:  Negative for confusion.    MEDICAL HISTORY:  Past Medical History:  Diagnosis Date   Achilles tendinitis    Acute medial meniscal tear    Allergy    Anxiety    Atrial fibrillation ([facility]    a. new onset 07/2016; b. CHADS2VASc --> 4 (HTN, female, age, CHF); c. eliquis    CHF (congestive heart failure) (HCC)    Chronic anticoagulation    Apixaban   Chronic lower back pain    Colon polyp    Complication of anesthesia    Emergence delirium; hypoxia   COPD (chronic obstructive pulmonary disease) ([facility]    Cystic breast    Depression    Endometriosis    Fibrocystic breast disease    GERD (gastroesophageal reflux disease)    History of stress test    a. 07/2016 MV: EF 55-65%. Small, mild defect  in mid anteroseptal, apical septal, and apical locations. No ischemia-->low risk.   Hyperlipidemia    Hypertension    Insomnia    Lipoma    Malignant neoplasm of upper-outer quadrant of right female breast (HCC)    Stage 1A invasive mammary carcinoma (cT1b or T1c N0); ER (-), PR (+), HER2/neu (+)   Migraine    Mild Mitral regurgitation    a. 07/2016 Echo: EF 55-65%, no rwma, mild MR. Mildly dil LA. Nl RV fx.    OSA (obstructive sleep apnea)    non-compliant with nocturnal PAP therapy   Osteoarthritis    left knee   Osteopenia    Peptic ulcer disease    a. H. pylori   Pneumonia    RLS (restless legs syndrome)    Tendonitis    Thyroid disease    Vitamin D deficiency     SURGICAL HISTORY: Past Surgical History:  Procedure Laterality Date   ABDOMINAL HYSTERECTOMY     ovaries left   ACHILLES TENDON SURGERY     2012,01/2017   BACK SURGERY     fusion lumbar   BREAST BIOPSY Right 01/15/2021   Affirm biopsy-"X" clip-INVASIVE MAMMARY CARCINOMA   BREAST BIOPSY Right 01/15/2021   u/s bx-"venus" clip INVASIVE MAMMARY CARCINOMA   BREAST BIOPSY     BREAST LUMPECTOMY,RADIO FREQ LOCALIZER,AXILLARY SENTINEL LYMPH NODE BIOPSY Right 02/17/2021   Procedure: BREAST LUMPECTOMY,RADIO FREQ LOCALIZER,AXILLARY SENTINEL LYMPH NODE BIOPSY;  Surgeon: Ronny Bacon, MD;  Location: [facility];  Service: General;  Laterality: Right;   CARDIOVERSION N/A 09/07/2016   Procedure: CARDIOVERSION;  Surgeon: Minna Merritts, MD;  Location: [facility];  Service: Cardiovascular;   Laterality: N/A;   CARDIOVERSION N/A 10/19/2016   Procedure: CARDIOVERSION;  Surgeon: Minna Merritts, MD;  Location: [facility];  Service: Cardiovascular;  Laterality: N/A;   CARDIOVERSION N/A 12/14/2016   Procedure: CARDIOVERSION;  Surgeon: Minna Merritts, MD;  Location: [facility];  Service: Cardiovascular;  Laterality: N/A;   COLONOSCOPY     COLONOSCOPY WITH PROPOFOL N/A 05/13/2020   Procedure: COLONOSCOPY WITH PROPOFOL;  Surgeon: Toledo, Benay Pike, MD;  Location: [facility];  Service: Gastroenterology;  Laterality: N/A;  ELIQUIS   ESOPHAGOGASTRODUODENOSCOPY     ESOPHAGOGASTRODUODENOSCOPY (EGD) WITH PROPOFOL N/A 05/13/2020   Procedure: ESOPHAGOGASTRODUODENOSCOPY (EGD) WITH PROPOFOL;  Surgeon: Toledo, Benay Pike, MD;  Location: [facility];  Service: Gastroenterology;  Laterality: N/A;   FOOT SURGERY Left    tendon   JOINT REPLACEMENT Right    hip   PORTA CATH INSERTION N/A 03/23/2021   Procedure: PORTA CATH INSERTION;  Surgeon: Katha Cabal, MD;  Location: [facility];  Service: Cardiovascular;  Laterality: N/A;   PORTACATH PLACEMENT N/A 03/23/2021   Procedure: ATTEMPTED INSERTION PORT-A-CATH;  Surgeon: Ronny Bacon, MD;  Location: [facility];  Service: General;  Laterality: N/A;   THYROIDECTOMY     thyroid lobectomy secondary to a benign nodule   TOTAL HIP ARTHROPLASTY Right 08/21/2018   Procedure: TOTAL HIP ARTHROPLASTY ANTERIOR APPROACH-RIGHT CELL SAVER REQUESTED;  Surgeon: [name], MD;  Location: [facility];  Service: Orthopedics;  Laterality: Right;    SOCIAL HISTORY: Social History   Socioeconomic History   Marital status: Married    Spouse name: jeffrey   Number of children: 1   Years of education: Not on file   Highest education level: High school graduate  Occupational History    Comment: disabled  Tobacco Use   Smoking status: Former    Packs/day: 0.25    Years: 10.00    Pack years: 2.50    Types: Cigarettes    Quit date: 2005    Years  since quitting: 17.8   Smokeless tobacco: Never   Tobacco comments:    smoked for 10 years, 1 pack every 2-3 days  Vaping Use   Vaping Use: Never used  Substance and Sexual Activity   Alcohol use: Not Currently    Alcohol/week: 1.0 standard drink    Types: 1 Glasses of wine per week    Comment: rarely   Drug use: No   Sexual activity: Not Currently  Other Topics Concern   Not on file  Social History Narrative   Moved to [place] from [place], originally from [place].    1 cup of caffeine daily   Right handed          Social Determinants of Health   Financial Resource Strain:  Low Risk    Difficulty of Paying Living Expenses: Not very hard  Food Insecurity: No Food Insecurity   Worried About Running Out of Food in the Last Year: Never true   Ran Out of Food in the Last Year: Never true  Transportation Needs: No Transportation Needs   Lack of Transportation (Medical): No   Lack of Transportation (Non-Medical): No  Physical Activity: Unknown   Days of Exercise per Week: 0 days   Minutes of Exercise per Session: Not on file  Stress: Stress Concern Present   Feeling of Stress : Rather much  Social Connections: Moderately Isolated   Frequency of Communication with Friends and Family: Never   Frequency of Social Gatherings with Friends and Family: Once a week   Attends Religious Services: More than 4 times per year   Active Member of [employer] or Organizations: No   Attends Music therapist: Never   Marital Status: Married  Human resources officer Violence: Not At Risk   Fear of Current or Ex-Partner: No   Emotionally Abused: No   Physically Abused: No   Sexually Abused: No    FAMILY HISTORY: Family History  Problem Relation Age of Onset   Cancer Sister 81       ovarian   Depression Sister    Cancer Brother        CLL   Cancer Father 20       colon   Heart disease Father    Heart attack Father 78       died from MI   Hyperlipidemia Father     Hypertension Father    Cancer Mother        leukemia   Breast cancer Paternal Aunt        58's   Migraines Neg Hx     ALLERGIES:  is allergic to prednisone, amiodarone, hydrocodone-acetaminophen, oxycodone, tapentadol, tramadol, adhesive [tape], and latex.  MEDICATIONS:  Current Outpatient Medications  Medication Sig Dispense Refill   acetaminophen (TYLENOL) 500 MG tablet Take 500 mg by mouth every 6 (six) hours as needed. Taking as needed     albuterol (VENTOLIN HFA) 108 (90 Base) MCG/ACT inhaler INHALE 2 PUFFS INTO THE LUNGS EVERY 8 (EIGHT) HOURS AS NEEDED FOR WHEEZING OR SHORTNESS OF BREATH. 6.7 g 0   azelastine (ASTELIN) 0.1 % nasal spray Place into both nostrils as needed for rhinitis. Use in each nostril as directed     B Complex Vitamins (VITAMIN B COMPLEX PO) Take 1 tablet by mouth daily.      busPIRone (BUSPAR) 5 MG tablet TAKE 1 TABLET BY MOUTH THREE TIMES A DAY AS NEEDED 60 tablet 1   calcium gluconate 500 MG tablet Take 500 tablets by mouth daily.     calcium-vitamin D (OSCAL WITH D) 500-200 MG-UNIT tablet Take 1 tablet by mouth daily with breakfast.     Cholecalciferol (VITAMIN D3) 5000 units CAPS Take 5,000 Units by mouth daily.     DULoxetine (CYMBALTA) 30 MG capsule Take 1 capsule (30 mg total) by mouth 2 (two) times daily. Home med.     ELIQUIS 5 MG TABS tablet TAKE 1 TABLET BY MOUTH TWICE A DAY 180 tablet 1   esomeprazole (NEXIUM) 20 MG capsule Take 20 mg by mouth as needed.     furosemide (LASIX) 20 MG tablet Take 1 tablet (20 mg total) by mouth 2 (two) times daily. 180 tablet 0   lidocaine-prilocaine (EMLA) cream Apply to affected area once 30 g 3  losartan (COZAAR) 25 MG tablet Take 1 tablet (25 mg total) by mouth daily. 90 tablet 3   Multiple Vitamin (MULTIVITAMIN) tablet Take 1 tablet by mouth daily.     nystatin (MYCOSTATIN/NYSTOP) powder Apply 1 application topically 3 (three) times daily. 15 g 1   ondansetron (ZOFRAN) 8 MG tablet Take 1 tablet (8 mg total)  by mouth every 8 (eight) hours as needed for nausea or vomiting. 60 tablet 0   potassium chloride SA (KLOR-CON) 20 MEQ tablet Take 1 tablet (20 mEq total) by mouth 2 (two) times daily. 120 tablet 2   ranolazine (RANEXA) 500 MG 12 hr tablet TAKE 1 TABLET BY MOUTH TWICE A DAY 180 tablet 0   rizatriptan (MAXALT) 10 MG tablet TAKE 1 TABLET (10 MG TOTAL) BY MOUTH AS NEEDED FOR MIGRAINE. MAY REPEAT IN 2 HOURS IF NEEDED 10 tablet 0   rOPINIRole (REQUIP) 0.5 MG tablet Take 1 tablet (0.5 mg total) by mouth at bedtime. 90 tablet 0   rOPINIRole (REQUIP) 1 MG tablet TAKE 1 TABLET BY MOUTH AT BEDTIME. 90 tablet 1   rosuvastatin (CRESTOR) 20 MG tablet TAKE 1 TABLET BY MOUTH EVERY DAY 90 tablet 0   bisoprolol (ZEBETA) 5 MG tablet Take 1 tablet (5 mg total) by mouth 2 (two) times daily. 180 tablet 1   prochlorperazine (COMPAZINE) 10 MG tablet Take 1 tablet (10 mg total) by mouth every 6 (six) hours as needed (Nausea or vomiting). (Patient not taking: No sig reported) 30 tablet 1   promethazine (PHENERGAN) 25 MG tablet Take 1 tablet (25 mg total) by mouth every 6 (six) hours as needed for nausea or vomiting. (Patient not taking: No sig reported) 60 tablet 0   No current facility-administered medications for this visit.   Facility-Administered Medications Ordered in Other Visits  Medication Dose Route Frequency Provider Last Rate Last Admin   heparin lock flush 100 UNIT/ML injection            heparin lock flush 100 unit/mL  500 Units Intravenous Once [name], MD       heparin lock flush 100 unit/mL  500 Units Intracatheter Once PRN [name], MD       PACLitaxel (TAXOL) 156 mg in sodium chloride 0.9 % 250 mL chemo infusion (</= 63m/m2)  65 mg/m2 (Order-Specific) Intravenous Once YEarlie Server MD       sodium chloride flush (NS) 0.9 % injection 10 mL  10 mL Intravenous PRN YEarlie Server MD   10 mL at 05/18/21 0850   sodium chloride flush (NS) 0.9 % injection 10 mL  10 mL Intracatheter PRN YEarlie Server MD        trastuzumab-anns ([facility] 252 mg in sodium chloride 0.9 % 250 mL chemo infusion  2 mg/kg (Treatment Plan Recorded) Intravenous Once YEarlie Server MD         PHYSICAL EXAMINATION: ECOG PERFORMANCE STATUS: 2 - Symptomatic, <50% confined to bed Vitals:   05/18/21 0909  BP: (!) 142/75  Pulse: (!) 53  Resp: 18  Temp: (!) 96.6 F (35.9 C)  SpO2: 99%   Filed Weights   05/18/21 0909  Weight: 277 lb 12.8 oz (126 kg)    Physical Exam Constitutional:      General: She is not in acute distress.    Appearance: She is obese.  HENT:     Head: Normocephalic and atraumatic.  Eyes:     General: No scleral icterus. Cardiovascular:     Rate and Rhythm: Normal rate and regular  rhythm.     Heart sounds: Normal heart sounds.  Pulmonary:     Effort: Pulmonary effort is normal. No respiratory distress.     Breath sounds: No wheezing.  Abdominal:     General: Bowel sounds are normal. There is no distension.     Palpations: Abdomen is soft.  Musculoskeletal:        General: No deformity. Normal range of motion.     Cervical back: Normal range of motion and neck supple.  Skin:    General: Skin is warm and dry.     Findings: No erythema or rash.  Neurological:     Mental Status: She is alert and oriented to person, place, and time. Mental status is at baseline.     Cranial Nerves: No cranial nerve deficit.     Coordination: Coordination normal.  Psychiatric:        Mood and Affect: Mood normal.    . LABORATORY DATA:  I have reviewed the data as listed Lab Results  Component Value Date   WBC 3.7 (L) 05/18/2021   HGB 10.7 (L) 05/18/2021   HCT 32.7 (L) 05/18/2021   MCV 96.2 05/18/2021   PLT 144 (L) 05/18/2021   Recent Labs    04/05/21 1738 04/06/21 0625 04/09/21 0440 05/10/21 0920 05/18/21 0850  NA 136   < > 137 139 139  K 3.8   < > 3.6 3.4* 3.9  CL 103   < > 107 102 103  CO2 25   < > _0 GLUCOSE 117*   < > 88 111* 91  BUN 17   < > 7* 12 11  CREATININE 0.98   < > 0.88  0.87 0.88  CALCIUM 8.3*   < > 7.6* 8.4* 8.4*  GFRNONAA >60   < > >60 >60 >60  PROT 6.9  --   --  6.4* 6.6  ALBUMIN 3.7  --   --  3.4* 3.5  AST 20  --   --  19 17  ALT 22  --   --  16 16  ALKPHOS 69  --   --  65 67  BILITOT 1.4*  --   --  0.7 0.8   < > = values in this interval not displayed.    Iron/TIBC/Ferritin/ %Sat    Component Value Date/Time   IRON 72 03/31/2021 0856   TIBC 364 03/31/2021 0856   FERRITIN 291 03/31/2021 0856   IRONPCTSAT 20 03/31/2021 0856      RADIOGRAPHIC STUDIES: I have personally reviewed the radiological images as listed and agreed with the findings in the report. No results found.    ASSESSMENT & PLAN:  1. HER2-positive carcinoma of breast ([place])   2. Encounter for antineoplastic chemotherapy   3. Neuropathy   4. Chemotherapy induced nausea and vomiting   5. Anemia due to antineoplastic chemotherapy   Cancer Staging Malignant neoplasm of upper-outer quadrant of right female breast [facility]) Staging form: Breast, AJCC 8th Edition - Pathologic stage from 03/03/2021: Stage IA (pT1b, pN0, cM0, G3, ER-, PR+, HER2+) - Signed by [name], MD on 03/03/2021  #Right breast invasive carcinoma pT1b pN0 ER negative, PR weakly HER2 positive positive, HER 2 positive.  Patient tolerates dose reduced Taxol and trastuzumab. Labs are reviewed and discussed with patient. Continue trastuzumab/Taxol with dose reduction 25m/m2 Decrease premed benadryl to 235mdue to restless leg syndrome  # chemotherapy induced nausea and vomiting.  Today she has no symptoms. Zofran 12m3m8hour  PRN.  Since now she has not experienced much nausea and vomiting, stop Ativan and resume her home dose of Xanax.  Patient has history of CHF, Repeat echocardiogram showed normal LVEF. Follow-up with cardiologist Dr. [name].Marland Kitchen   #Pre-existing neuropathy in her fingertips bilaterally-Per patient this is secondary to cervical spine radiculopathy Patient is made aware that neuropathy may get  worse secondary to chemotherapy #Chemotherapy-induced anemia, hemoglobin has slightly decreased.  Monitor. # she declined genetic testing  Supportive care measures are necessary for patient well-being and will be provided as necessary. We spent sufficient time to discuss many aspect of care, questions were answered to patient's satisfaction.   All questions were answered. The patient knows to call the clinic with any problems questions or concerns.  cc [name], FNP  Follow-up 1 week, Taxol and trastuzumab.   [name], MD, PhD 05/18/2021

## 2021-05-25 ENCOUNTER — Inpatient Hospital Stay: Payer: Medicare Other

## 2021-05-25 ENCOUNTER — Inpatient Hospital Stay (HOSPITAL_BASED_OUTPATIENT_CLINIC_OR_DEPARTMENT_OTHER): Payer: Medicare Other | Admitting: Oncology

## 2021-05-25 ENCOUNTER — Other Ambulatory Visit: Payer: Self-pay

## 2021-05-25 ENCOUNTER — Encounter: Payer: Self-pay | Admitting: Oncology

## 2021-05-25 VITALS — BP 128/75 | HR 81 | Temp 96.8°F | Wt 273.0 lb

## 2021-05-25 DIAGNOSIS — R112 Nausea with vomiting, unspecified: Secondary | ICD-10-CM | POA: Diagnosis not present

## 2021-05-25 DIAGNOSIS — I509 Heart failure, unspecified: Secondary | ICD-10-CM | POA: Diagnosis not present

## 2021-05-25 DIAGNOSIS — D701 Agranulocytosis secondary to cancer chemotherapy: Secondary | ICD-10-CM | POA: Diagnosis not present

## 2021-05-25 DIAGNOSIS — C50919 Malignant neoplasm of unspecified site of unspecified female breast: Secondary | ICD-10-CM | POA: Diagnosis not present

## 2021-05-25 DIAGNOSIS — G629 Polyneuropathy, unspecified: Secondary | ICD-10-CM

## 2021-05-25 DIAGNOSIS — T451X5A Adverse effect of antineoplastic and immunosuppressive drugs, initial encounter: Secondary | ICD-10-CM | POA: Diagnosis not present

## 2021-05-25 DIAGNOSIS — Z5111 Encounter for antineoplastic chemotherapy: Secondary | ICD-10-CM

## 2021-05-25 DIAGNOSIS — Z171 Estrogen receptor negative status [ER-]: Secondary | ICD-10-CM | POA: Diagnosis not present

## 2021-05-25 DIAGNOSIS — Z79899 Other long term (current) drug therapy: Secondary | ICD-10-CM | POA: Diagnosis not present

## 2021-05-25 DIAGNOSIS — G2581 Restless legs syndrome: Secondary | ICD-10-CM | POA: Diagnosis not present

## 2021-05-25 DIAGNOSIS — D6481 Anemia due to antineoplastic chemotherapy: Secondary | ICD-10-CM | POA: Diagnosis not present

## 2021-05-25 DIAGNOSIS — C50411 Malignant neoplasm of upper-outer quadrant of right female breast: Secondary | ICD-10-CM | POA: Diagnosis not present

## 2021-05-25 LAB — CBC WITH DIFFERENTIAL/PLATELET
Abs Immature Granulocytes: 0.01 K/uL (ref 0.00–0.07)
Basophils Absolute: 0 K/uL (ref 0.0–0.1)
Basophils Relative: 1 %
Eosinophils Absolute: 0 K/uL (ref 0.0–0.5)
Eosinophils Relative: 1 %
HCT: 32.6 % — ABNORMAL LOW (ref 36.0–46.0)
Hemoglobin: 10.8 g/dL — ABNORMAL LOW (ref 12.0–15.0)
Immature Granulocytes: 0 %
Lymphocytes Relative: 51 %
Lymphs Abs: 1.4 K/uL (ref 0.7–4.0)
MCH: 31.1 pg (ref 26.0–34.0)
MCHC: 33.1 g/dL (ref 30.0–36.0)
MCV: 93.9 fL (ref 80.0–100.0)
Monocytes Absolute: 0.2 K/uL (ref 0.1–1.0)
Monocytes Relative: 5 %
Neutro Abs: 1.2 K/uL — ABNORMAL LOW (ref 1.7–7.7)
Neutrophils Relative %: 42 %
Platelets: 245 K/uL (ref 150–400)
RBC: 3.47 MIL/uL — ABNORMAL LOW (ref 3.87–5.11)
RDW: 14.1 % (ref 11.5–15.5)
WBC: 2.8 K/uL — ABNORMAL LOW (ref 4.0–10.5)
nRBC: 0 % (ref 0.0–0.2)

## 2021-05-25 LAB — COMPREHENSIVE METABOLIC PANEL WITH GFR
ALT: 16 U/L (ref 0–44)
AST: 16 U/L (ref 15–41)
Albumin: 3.7 g/dL (ref 3.5–5.0)
Alkaline Phosphatase: 70 U/L (ref 38–126)
Anion gap: 12 (ref 5–15)
BUN: 13 mg/dL (ref 8–23)
CO2: 26 mmol/L (ref 22–32)
Calcium: 8.7 mg/dL — ABNORMAL LOW (ref 8.9–10.3)
Chloride: 98 mmol/L (ref 98–111)
Creatinine, Ser: 0.73 mg/dL (ref 0.44–1.00)
GFR, Estimated: >60 mL/min
Glucose, Bld: 109 mg/dL — ABNORMAL HIGH (ref 70–99)
Potassium: 3.9 mmol/L (ref 3.5–5.1)
Sodium: 136 mmol/L (ref 135–145)
Total Bilirubin: 0.8 mg/dL (ref 0.3–1.2)
Total Protein: 7.1 g/dL (ref 6.5–8.1)

## 2021-05-25 MED ORDER — DIPHENHYDRAMINE HCL 50 MG/ML IJ SOLN
25.0000 mg | Freq: Once | INTRAMUSCULAR | Status: AC
  Administered 2021-05-25: 25 mg via INTRAVENOUS
  Filled 2021-05-25: qty 1

## 2021-05-25 MED ORDER — SODIUM CHLORIDE 0.9 % IV SOLN
Freq: Once | INTRAVENOUS | Status: AC
  Filled 2021-05-25: qty 250

## 2021-05-25 MED ORDER — TRASTUZUMAB-ANNS CHEMO 150 MG IV SOLR
2.0000 mg/kg | Freq: Once | INTRAVENOUS | Status: AC
  Administered 2021-05-25: 252 mg via INTRAVENOUS
  Filled 2021-05-25: qty 12

## 2021-05-25 MED ORDER — ACETAMINOPHEN 325 MG PO TABS
650.0000 mg | ORAL_TABLET | Freq: Once | ORAL | Status: AC
  Administered 2021-05-25: 650 mg via ORAL
  Filled 2021-05-25: qty 2

## 2021-05-25 MED ORDER — LOPERAMIDE HCL 2 MG PO CAPS: 2.0000 mg | ORAL_CAPSULE | ORAL | 1 refills | Status: DC

## 2021-05-25 MED ORDER — FAMOTIDINE 20 MG IN NS 100 ML IVPB
20.0000 mg | Freq: Once | INTRAVENOUS | Status: AC
  Administered 2021-05-25: 20 mg via INTRAVENOUS
  Filled 2021-05-25: qty 20

## 2021-05-25 MED ORDER — SODIUM CHLORIDE 0.9 % IV SOLN
65.0000 mg/m2 | Freq: Once | INTRAVENOUS | Status: AC
  Administered 2021-05-25: 156 mg via INTRAVENOUS
  Filled 2021-05-25: qty 26

## 2021-05-25 MED ORDER — SODIUM CHLORIDE 0.9 % IV SOLN
10.0000 mg | Freq: Once | INTRAVENOUS | Status: AC
  Administered 2021-05-25: 10 mg via INTRAVENOUS
  Filled 2021-05-25: qty 10

## 2021-05-25 NOTE — Progress Notes (Signed)
Hematology/Oncology Progress note [phone] [phone]   Patient Care Team: [name], FNP as PCP - General (Family Medicine) Amber Barnes Amber November, MD as PCP - Cardiology (Cardiology) Amber Cousin, MD (Inactive) Amber Barnes, Amber November, MD as Consulting Physician (Cardiology) Amber Junker, RN as Oncology Nurse Navigator Amber Server, MD as Consulting Physician (Hematology and Oncology)  REFERRING PROVIDER: [name], FNP  CHIEF COMPLAINTS/REASON FOR VISIT:  Follow-up for stage I HER2 positive breast cancer  HISTORY OF PRESENTING ILLNESS:   Amber Barnes is a  70 y.o.  female with PMH listed below was seen in consultation at the request of  [name], FNP  for evaluation of weight loss, family history ovarian cancer. Patient reports a history of nausea vomiting, early satiety, dysphagia symptoms that started in March.  She has lost significant amount of weight as well. Patient weighed 340 pounds in February 2021, 309 pounds in May 2021 and currently she weighs 296 pounds today. Patient has tried Zofran and scopolamine patch which did not relieve her symptoms. 11/05/2019, CT abdomen pelvis showed chronic changes with out acute abnormality to correspond with the patient's symptoms. Patient has had right upper quadrant ultrasound done on 11/14/2019, which showed hepatic steatosis with no other acute abnormality. Patient was seen by gastroenterology Dr. Alice Barnes on 11/18/2019.  Patient was recommended to take PPI daily.  Added Carafate.  H. pylori testing came back positive and the patient was started on a course of tetracycline, metronidazole, Pepto-Bismol for H. pylori treatments.  Patient reports that she has been on the treatment for 7 days now.  Nausea is slightly better but still persistent.  She is following up with gastroenterology next week for discussion of EGD. Currently she is not able to tolerate colonoscopy prep due to the nausea.  Her stool  was tested positive for occult blood. She also reports night sweating which is a chronic symptom for her. Patient was last seen on 05/20/2020 for evaluation of weight loss, family history of ovarian cancer patient was referred to establish care with genetic counselor.  Patient eventually declined genetic testing.  12/30/2020, patient had a screening mammogram done which showed possible mass and asymmetry in the right breast. 01/08/2021, diagnostic mammogram of the right breast showed 2 adjacent irregular spiculated masses.  Larger 1 was 10 mm and a smaller mass measured 5 mm. 01/15/2021 stereotactic biopsy of the right breast smaller mass showed invasive carcinoma no special type, grade 2, DCIS present, LVI negative. Ultrasound guided biopsy of the larger mass showed invasive mammary carcinoma,   Patient has appointment with surgery Dr. Christian Barnes later this week.  She was referred to establish care with oncology for evaluation.  She was accompanied by daughter. She has no new complaints.  Family history of breast cancer: Denies Family history of other cancers: Patient has a strong family history of leukemia in her mother, colon cancer in her father at age of 55, ovarian cancer in her sister at age of 67, CLL in her brother  02/17/2021, patient is status post lumpectomy and sentinel lymph node biopsy Pathology showed invasive mammary carcinoma, 2 separate foci, DCIS, negative sentinel lymph node biopsy 0/1, grade 3, or margins negative for invasive carcinoma.  ER -, PR + [1-10%], HER2 +  pT1b(m) pN0   # Strong family history of cancer, Discussed again about genetic testing. patient adamantly declined.  #  Intermittent dizziness which per patient is chronic for her due to the vertigo. #03/23/2021 Mediport by vascular surgeon #History  of CHF, 03/08/2021, echocardiogram showed LVEF 60-65%. Per patient, she has been seen by Dr. [name] team and was cleared for proceeding with chemotherapy.   03/24/2021  - 03/31/2021 Taxol 80 mg/m2 [name] 04/05/2021 - 04/09/2021 patient was admitted due to neutropenic fever with associated SIRS.  Patient was treated with empiric cefepime and vancomycin until date of discharge when Gilson recovered to 1.4.  Post hospitalization, patient also had COVID-19 infection so her follow-up appointment was further postponed for another 2 weeks.  05/10/2021 resumed dose reduce Taxol $RemoveBefo'65mg'SiUyPxkdYMl$ /m2 [name]  INTERVAL HISTORY Amber Barnes is a 70 y.o. female who has above history reviewed by me today presents for follow up visit for management of right side HER 2 positive breast cancer Patient tolerates chemotherapy well.  Denies any nausea vomiting diarrhea, fever or chills.  Pre-existing neuropathy, stable.  Patient has had 1 episode of loose bowel movement and he took over-the-counter Imodium.  Symptoms are relieved.  Review of Systems  Constitutional:  Positive for fatigue. Negative for appetite change, chills, fever and unexpected weight change.       Patient sits in the wheelchair  HENT:   Negative for hearing loss and voice change.   Eyes:  Negative for eye problems.  Respiratory:  Negative for chest tightness and cough.   Cardiovascular:  Negative for chest pain.  Gastrointestinal:  Negative for abdominal distention, abdominal pain, blood in stool, nausea and vomiting.  Endocrine: Negative for hot flashes.  Genitourinary:  Negative for difficulty urinating and frequency.   Musculoskeletal:  Negative for arthralgias.  Skin:  Negative for itching and rash.  Neurological:  Positive for numbness. Negative for dizziness and extremity weakness.  Hematological:  Negative for adenopathy.  Psychiatric/Behavioral:  Negative for confusion.    MEDICAL HISTORY:  Past Medical History:  Diagnosis Date   Achilles tendinitis    Acute medial meniscal tear    Allergy    Anxiety    Atrial fibrillation ([place])    a. new onset 07/2016; b. CHADS2VASc --> 4 (HTN, female, age,  CHF); c. eliquis   CHF (congestive heart failure) (HCC)    Chronic anticoagulation    Apixaban   Chronic lower back pain    Colon polyp    Complication of anesthesia    Emergence delirium; hypoxia   COPD (chronic obstructive pulmonary disease) ([place])    Cystic breast    Depression    Endometriosis    Fibrocystic breast disease    GERD (gastroesophageal reflux disease)    History of stress test    a. 07/2016 MV: EF 55-65%. Small, mild defect  in mid anteroseptal, apical septal, and apical locations. No ischemia-->low risk.   Hyperlipidemia    Hypertension    Insomnia    Lipoma    Malignant neoplasm of upper-outer quadrant of right female breast (HCC)    Stage 1A invasive mammary carcinoma (cT1b or T1c N0); ER (-), PR (+), HER2/neu (+)   Migraine    Mild Mitral regurgitation    a. 07/2016 Echo: EF 55-65%, no rwma, mild MR. Mildly dil LA. Nl RV fx.    OSA (obstructive sleep apnea)    non-compliant with nocturnal PAP therapy   Osteoarthritis    left knee   Osteopenia    Peptic ulcer disease    a. H. pylori   Pneumonia    RLS (restless legs syndrome)    Tendonitis    Thyroid disease    Vitamin D deficiency     SURGICAL HISTORY: Past Surgical History:  Procedure Laterality Date   ABDOMINAL HYSTERECTOMY     ovaries left   ACHILLES TENDON SURGERY     2012,01/2017   BACK SURGERY     fusion lumbar   BREAST BIOPSY Right 01/15/2021   Affirm biopsy-"X" clip-INVASIVE MAMMARY CARCINOMA   BREAST BIOPSY Right 01/15/2021   u/s bx-"venus" clip INVASIVE MAMMARY CARCINOMA   BREAST BIOPSY     BREAST LUMPECTOMY,RADIO FREQ LOCALIZER,AXILLARY SENTINEL LYMPH NODE BIOPSY Right 02/17/2021   Procedure: BREAST LUMPECTOMY,RADIO FREQ LOCALIZER,AXILLARY SENTINEL LYMPH NODE BIOPSY;  Surgeon: Ronny Bacon, MD;  Location: [facility];  Service: General;  Laterality: Right;   CARDIOVERSION N/A 09/07/2016   Procedure: CARDIOVERSION;  Surgeon: Minna Merritts, MD;  Location: [facility];  Service:  Cardiovascular;  Laterality: N/A;   CARDIOVERSION N/A 10/19/2016   Procedure: CARDIOVERSION;  Surgeon: Minna Merritts, MD;  Location: [facility];  Service: Cardiovascular;  Laterality: N/A;   CARDIOVERSION N/A 12/14/2016   Procedure: CARDIOVERSION;  Surgeon: Minna Merritts, MD;  Location: [facility];  Service: Cardiovascular;  Laterality: N/A;   COLONOSCOPY     COLONOSCOPY WITH PROPOFOL N/A 05/13/2020   Procedure: COLONOSCOPY WITH PROPOFOL;  Surgeon: Toledo, Benay Pike, MD;  Location: [facility];  Service: Gastroenterology;  Laterality: N/A;  ELIQUIS   ESOPHAGOGASTRODUODENOSCOPY     ESOPHAGOGASTRODUODENOSCOPY (EGD) WITH PROPOFOL N/A 05/13/2020   Procedure: ESOPHAGOGASTRODUODENOSCOPY (EGD) WITH PROPOFOL;  Surgeon: Toledo, Benay Pike, MD;  Location: [facility];  Service: Gastroenterology;  Laterality: N/A;   FOOT SURGERY Left    tendon   JOINT REPLACEMENT Right    hip   PORTA CATH INSERTION N/A 03/23/2021   Procedure: PORTA CATH INSERTION;  Surgeon: Katha Cabal, MD;  Location: [facility];  Service: Cardiovascular;  Laterality: N/A;   PORTACATH PLACEMENT N/A 03/23/2021   Procedure: ATTEMPTED INSERTION PORT-A-CATH;  Surgeon: Ronny Bacon, MD;  Location: [facility];  Service: General;  Laterality: N/A;   THYROIDECTOMY     thyroid lobectomy secondary to a benign nodule   TOTAL HIP ARTHROPLASTY Right 08/21/2018   Procedure: TOTAL HIP ARTHROPLASTY ANTERIOR APPROACH-RIGHT CELL SAVER REQUESTED;  Surgeon: [name], MD;  Location: [facility];  Service: Orthopedics;  Laterality: Right;    SOCIAL HISTORY: Social History   Socioeconomic History   Marital status: Married    Spouse name: jeffrey   Number of children: 1   Years of education: Not on file   Highest education level: High school graduate  Occupational History    Comment: disabled  Tobacco Use   Smoking status: Former    Packs/day: 0.25    Years: 10.00    Pack years: 2.50    Types: Cigarettes    Quit  date: 2005    Years since quitting: 17.8   Smokeless tobacco: Never   Tobacco comments:    smoked for 10 years, 1 pack every 2-3 days  Vaping Use   Vaping Use: Never used  Substance and Sexual Activity   Alcohol use: Not Currently    Alcohol/week: 1.0 standard drink    Types: 1 Glasses of wine per week    Comment: rarely   Drug use: No   Sexual activity: Not Currently  Other Topics Concern   Not on file  Social History Narrative   Moved to [place] from [place], originally from [place].    1 cup of caffeine daily   Right handed          Social Determinants of Health   Financial Resource Strain:  Low Risk    Difficulty of Paying Living Expenses: Not very hard  Food Insecurity: No Food Insecurity   Worried About Running Out of Food in the Last Year: Never true   Ran Out of Food in the Last Year: Never true  Transportation Needs: No Transportation Needs   Lack of Transportation (Medical): No   Lack of Transportation (Non-Medical): No  Physical Activity: Unknown   Days of Exercise per Week: 0 days   Minutes of Exercise per Session: Not on file  Stress: Stress Concern Present   Feeling of Stress : Rather much  Social Connections: Moderately Isolated   Frequency of Communication with Friends and Family: Never   Frequency of Social Gatherings with Friends and Family: Once a week   Attends Religious Services: More than 4 times per year   Active Member of [employer] or Organizations: No   Attends Music therapist: Never   Marital Status: Married  Human resources officer Violence: Not At Risk   Fear of Current or Ex-Partner: No   Emotionally Abused: No   Physically Abused: No   Sexually Abused: No    FAMILY HISTORY: Family History  Problem Relation Age of Onset   Cancer Sister 66       ovarian   Depression Sister    Cancer Brother        CLL   Cancer Father 28       colon   Heart disease Father    Heart attack Father 66       died from MI    Hyperlipidemia Father    Hypertension Father    Cancer Mother        leukemia   Breast cancer Paternal Aunt        38's   Migraines Neg Hx     ALLERGIES:  is allergic to prednisone, amiodarone, hydrocodone-acetaminophen, oxycodone, tapentadol, tramadol, adhesive [tape], and latex.  MEDICATIONS:  Current Outpatient Medications  Medication Sig Dispense Refill   acetaminophen (TYLENOL) 500 MG tablet Take 500 mg by mouth every 6 (six) hours as needed. Taking as needed     albuterol (VENTOLIN HFA) 108 (90 Base) MCG/ACT inhaler INHALE 2 PUFFS INTO THE LUNGS EVERY 8 (EIGHT) HOURS AS NEEDED FOR WHEEZING OR SHORTNESS OF BREATH. 6.7 g 0   azelastine (ASTELIN) 0.1 % nasal spray Place into both nostrils as needed for rhinitis. Use in each nostril as directed     B Complex Vitamins (VITAMIN B COMPLEX PO) Take 1 tablet by mouth daily.      busPIRone (BUSPAR) 5 MG tablet TAKE 1 TABLET BY MOUTH THREE TIMES A DAY AS NEEDED 60 tablet 1   calcium gluconate 500 MG tablet Take 500 tablets by mouth daily.     calcium-vitamin D (OSCAL WITH D) 500-200 MG-UNIT tablet Take 1 tablet by mouth daily with breakfast.     Cholecalciferol (VITAMIN D3) 5000 units CAPS Take 5,000 Units by mouth daily.     DULoxetine (CYMBALTA) 30 MG capsule Take 1 capsule (30 mg total) by mouth 2 (two) times daily. Home med.     ELIQUIS 5 MG TABS tablet TAKE 1 TABLET BY MOUTH TWICE A DAY 180 tablet 1   esomeprazole (NEXIUM) 20 MG capsule Take 20 mg by mouth as needed.     furosemide (LASIX) 20 MG tablet Take 1 tablet (20 mg total) by mouth 2 (two) times daily. 180 tablet 0   lidocaine-prilocaine (EMLA) cream Apply to affected area once 30 g 3  losartan (COZAAR) 25 MG tablet Take 1 tablet (25 mg total) by mouth daily. 90 tablet 3   Multiple Vitamin (MULTIVITAMIN) tablet Take 1 tablet by mouth daily.     nystatin (MYCOSTATIN/NYSTOP) powder Apply 1 application topically 3 (three) times daily. 15 g 1   potassium chloride SA (KLOR-CON) 20  MEQ tablet Take 1 tablet (20 mEq total) by mouth 2 (two) times daily. 120 tablet 2   ranolazine (RANEXA) 500 MG 12 hr tablet TAKE 1 TABLET BY MOUTH TWICE A DAY 180 tablet 0   rizatriptan (MAXALT) 10 MG tablet TAKE 1 TABLET (10 MG TOTAL) BY MOUTH AS NEEDED FOR MIGRAINE. MAY REPEAT IN 2 HOURS IF NEEDED 10 tablet 0   rOPINIRole (REQUIP) 0.5 MG tablet Take 1 tablet (0.5 mg total) by mouth at bedtime. 90 tablet 0   rOPINIRole (REQUIP) 1 MG tablet TAKE 1 TABLET BY MOUTH AT BEDTIME. 90 tablet 1   rosuvastatin (CRESTOR) 20 MG tablet TAKE 1 TABLET BY MOUTH EVERY DAY 90 tablet 0   bisoprolol (ZEBETA) 5 MG tablet Take 1 tablet (5 mg total) by mouth 2 (two) times daily. 180 tablet 1   ondansetron (ZOFRAN) 8 MG tablet Take 1 tablet (8 mg total) by mouth every 8 (eight) hours as needed for nausea or vomiting. 60 tablet 0   prochlorperazine (COMPAZINE) 10 MG tablet Take 1 tablet (10 mg total) by mouth every 6 (six) hours as needed (Nausea or vomiting). (Patient not taking: No sig reported) 30 tablet 1   promethazine (PHENERGAN) 25 MG tablet Take 1 tablet (25 mg total) by mouth every 6 (six) hours as needed for nausea or vomiting. (Patient not taking: No sig reported) 60 tablet 0   No current facility-administered medications for this visit.     PHYSICAL EXAMINATION: ECOG PERFORMANCE STATUS: 2 - Symptomatic, <50% confined to bed Vitals:   05/25/21 1333  BP: 128/75  Pulse: 81  Temp: (!) 96.8 F (36 C)   Filed Weights   05/25/21 1333  Weight: 273 lb (123.8 kg)    Physical Exam Constitutional:      General: She is not in acute distress.    Appearance: She is obese.  HENT:     Head: Normocephalic and atraumatic.  Eyes:     General: No scleral icterus. Cardiovascular:     Rate and Rhythm: Normal rate and regular rhythm.     Heart sounds: Normal heart sounds.  Pulmonary:     Effort: Pulmonary effort is normal. No respiratory distress.     Breath sounds: No wheezing.  Abdominal:     General:  Bowel sounds are normal. There is no distension.     Palpations: Abdomen is soft.  Musculoskeletal:        General: No deformity. Normal range of motion.     Cervical back: Normal range of motion and neck supple.  Skin:    General: Skin is warm and dry.     Findings: No erythema or rash.  Neurological:     Mental Status: She is alert and oriented to person, place, and time. Mental status is at baseline.     Cranial Nerves: No cranial nerve deficit.     Coordination: Coordination normal.  Psychiatric:        Mood and Affect: Mood normal.    . LABORATORY DATA:  I have reviewed the data as listed Lab Results  Component Value Date   WBC 2.8 (L) 05/25/2021   HGB 10.8 (L) 05/25/2021   HCT  32.6 (L) 05/25/2021   MCV 93.9 05/25/2021   PLT 245 05/25/2021   Recent Labs    05/10/21 0920 05/18/21 0850 05/25/21 1317  NA 139 139 136  K 3.4* 3.9 3.9  CL 102 103 98  CO2 $Re'26 29 26  'LLx$ GLUCOSE 111* 91 109*  BUN $Re'12 11 13  'nyp$ CREATININE 0.87 0.88 0.73  CALCIUM 8.4* 8.4* 8.7*  GFRNONAA >60 >60 >60  PROT 6.4* 6.6 7.1  ALBUMIN 3.4* 3.5 3.7  AST $Re'19 17 16  'euM$ ALT $R'16 16 16  'KK$ ALKPHOS 65 67 70  BILITOT 0.7 0.8 0.8    Iron/TIBC/Ferritin/ %Sat    Component Value Date/Time   IRON 72 03/31/2021 0856   TIBC 364 03/31/2021 0856   FERRITIN 291 03/31/2021 0856   IRONPCTSAT 20 03/31/2021 0856      RADIOGRAPHIC STUDIES: I have personally reviewed the radiological images as listed and agreed with the findings in the report. No results found.    ASSESSMENT & PLAN:  1. HER2-positive carcinoma of breast ([place])   2. Encounter for antineoplastic chemotherapy   3. Neuropathy   4. Anemia due to antineoplastic chemotherapy   5. Chemotherapy induced neutropenia (HCC)   Cancer Staging Malignant neoplasm of upper-outer quadrant of right female breast [facility]) Staging form: Breast, AJCC 8th Edition - Pathologic stage from 03/03/2021: Stage IA (pT1b, pN0, cM0, G3, ER-, PR+, HER2+) - Signed by Amber Server, MD on  03/03/2021  #Right breast invasive carcinoma pT1b pN0 ER negative, PR weakly HER2 positive positive, HER 2 positive.  Patient tolerates dose reduced Taxol and trastuzumab. Labs are reviewed and discussed with patient Continue trastuzumab/Taxol with dose reduction $RemoveBefore'65mg'unYsHZEDMLTKh$ /m2 Decrease premed benadryl to $RemoveBef'25mg'bwsDThrFVn$  due to restless leg syndrome  # chemotherapy induced nausea and vomiting.   Patient's symptom has resolved.  Continue home antiemetics as needed.  #Chemotherapy-induced diarrhea, mild symptoms.  Discussed about instructions of Imodium.  Prescription sent to pharmacy. #Chemotherapy-induced neutropenia, mild.  Okay to proceed with treatment this week. I recommend patient to skip next week's treatment and I will see him in 2 weeks to resume chemotherapy.   Patient has history of CHF, Repeat echocardiogram showed normal LVEF. Follow-up with cardiologist Dr. Rockey Barnes.Marland Kitchen   #Pre-existing neuropathy in her fingertips bilaterally-Per patient this is secondary to cervical spine radiculopathy Patient is made aware that neuropathy may get worse secondary to chemotherapy  #Chemotherapy-induced anemia, hemoglobin has been stable.  Monitor. # she declined genetic testing  Supportive care measures are necessary for patient well-being and will be provided as necessary. We spent sufficient time to discuss many aspect of care, questions were answered to patient's satisfaction.   All questions were answered. The patient knows to call the clinic with any problems questions or concerns.  cc [name], FNP  Follow-up 2 week, Taxol and trastuzumab.   Amber Server, MD, PhD 05/25/2021

## 2021-05-25 NOTE — Patient Instructions (Signed)
[facility] [place] [facility]  Discharge Instructions: Thank you for choosing [facility] to provide your oncology and hematology care.  If you have a lab appointment with the Cancer Center, please go directly to the Cancer Center and check in at the registration area.  Wear comfortable clothing and clothing appropriate for easy access to any Portacath or PICC line.   We strive to give you quality time with your provider. You may need to reschedule your appointment if you arrive late (15 or more minutes).  Arriving late affects you and other patients whose appointments are after yours.  Also, if you miss three or more appointments without notifying the office, you may be dismissed from the clinic at the provider's discretion.      For prescription refill requests, have your pharmacy contact our office and allow 72 hours for refills to be completed.      To help prevent nausea and vomiting after your treatment, we encourage you to take your nausea medication as directed.  BELOW ARE SYMPTOMS THAT SHOULD BE REPORTED IMMEDIATELY: *FEVER GREATER THAN 100.4 F (38 C) OR HIGHER *CHILLS OR SWEATING *NAUSEA AND VOMITING THAT IS NOT CONTROLLED WITH YOUR NAUSEA MEDICATION *UNUSUAL SHORTNESS OF BREATH *UNUSUAL BRUISING OR BLEEDING *URINARY PROBLEMS (pain or burning when urinating, or frequent urination) *BOWEL PROBLEMS (unusual diarrhea, constipation, pain near the anus) TENDERNESS IN MOUTH AND THROAT WITH OR WITHOUT PRESENCE OF ULCERS (sore throat, sores in mouth, or a toothache) UNUSUAL RASH, SWELLING OR PAIN  UNUSUAL VAGINAL DISCHARGE OR ITCHING   Items with * indicate a potential emergency and should be followed up as soon as possible or go to the Emergency Department if any problems should occur.  Please show the CHEMOTHERAPY ALERT CARD or IMMUNOTHERAPY ALERT CARD at check-in to the Emergency Department and triage nurse.  Should you have questions after your  visit or need to cancel or reschedule your appointment, please contact [facility] [facility]  [phone] and follow the prompts.  Office hours are 8:00 a.m. to 4:30 p.m. Monday - Friday. Please note that voicemails left after 4:00 p.m. may not be returned until the following business day.  We are closed weekends and major holidays. You have access to a nurse at all times for urgent questions. Please call the main number to the clinic [phone] and follow the prompts.  For any non-urgent questions, you may also contact your provider using MyChart. We now offer e-Visits for anyone 18 and older to request care online for non-urgent symptoms. For details visit [email or web address].   Also download the MyChart app! Go to the app store, search "MyChart", open the app, select [facility], and log in with your MyChart username and password.  Due to Covid, a mask is required upon entering the hospital/clinic. If you do not have a mask, one will be given to you upon arrival. For doctor visits, patients may have 1 support person aged 18 or older with them. For treatment visits, patients cannot have anyone with them due to current Covid guidelines and our immunocompromised population.  

## 2021-06-01 ENCOUNTER — Inpatient Hospital Stay: Payer: Medicare Other

## 2021-06-01 ENCOUNTER — Inpatient Hospital Stay: Payer: Medicare Other | Admitting: Nurse Practitioner

## 2021-06-09 ENCOUNTER — Inpatient Hospital Stay: Payer: Medicare Other

## 2021-06-09 ENCOUNTER — Inpatient Hospital Stay (HOSPITAL_BASED_OUTPATIENT_CLINIC_OR_DEPARTMENT_OTHER): Payer: Medicare Other | Admitting: Oncology

## 2021-06-09 ENCOUNTER — Other Ambulatory Visit: Payer: Self-pay

## 2021-06-09 ENCOUNTER — Encounter: Payer: Self-pay | Admitting: Oncology

## 2021-06-09 VITALS — BP 131/62 | HR 95 | Temp 98.1°F | Resp 18 | Wt 276.9 lb

## 2021-06-09 DIAGNOSIS — T451X5A Adverse effect of antineoplastic and immunosuppressive drugs, initial encounter: Secondary | ICD-10-CM | POA: Insufficient documentation

## 2021-06-09 DIAGNOSIS — K521 Toxic gastroenteritis and colitis: Secondary | ICD-10-CM | POA: Insufficient documentation

## 2021-06-09 DIAGNOSIS — R112 Nausea with vomiting, unspecified: Secondary | ICD-10-CM | POA: Diagnosis not present

## 2021-06-09 DIAGNOSIS — C50919 Malignant neoplasm of unspecified site of unspecified female breast: Secondary | ICD-10-CM | POA: Diagnosis not present

## 2021-06-09 DIAGNOSIS — Z79899 Other long term (current) drug therapy: Secondary | ICD-10-CM | POA: Diagnosis not present

## 2021-06-09 DIAGNOSIS — D6481 Anemia due to antineoplastic chemotherapy: Secondary | ICD-10-CM

## 2021-06-09 DIAGNOSIS — Z171 Estrogen receptor negative status [ER-]: Secondary | ICD-10-CM | POA: Diagnosis not present

## 2021-06-09 DIAGNOSIS — Z8041 Family history of malignant neoplasm of ovary: Secondary | ICD-10-CM

## 2021-06-09 DIAGNOSIS — G629 Polyneuropathy, unspecified: Secondary | ICD-10-CM | POA: Diagnosis not present

## 2021-06-09 DIAGNOSIS — Z5111 Encounter for antineoplastic chemotherapy: Secondary | ICD-10-CM | POA: Diagnosis not present

## 2021-06-09 DIAGNOSIS — G2581 Restless legs syndrome: Secondary | ICD-10-CM | POA: Diagnosis not present

## 2021-06-09 DIAGNOSIS — I509 Heart failure, unspecified: Secondary | ICD-10-CM | POA: Diagnosis not present

## 2021-06-09 DIAGNOSIS — C50411 Malignant neoplasm of upper-outer quadrant of right female breast: Secondary | ICD-10-CM | POA: Diagnosis not present

## 2021-06-09 LAB — CBC WITH DIFFERENTIAL/PLATELET
Abs Immature Granulocytes: 0.04 10*3/uL (ref 0.00–0.07)
Basophils Absolute: 0.1 10*3/uL (ref 0.0–0.1)
Basophils Relative: 1 %
Eosinophils Absolute: 0 10*3/uL (ref 0.0–0.5)
Eosinophils Relative: 1 %
HCT: 34.1 % — ABNORMAL LOW (ref 36.0–46.0)
Hemoglobin: 11.2 g/dL — ABNORMAL LOW (ref 12.0–15.0)
Immature Granulocytes: 1 %
Lymphocytes Relative: 28 %
Lymphs Abs: 1.6 10*3/uL (ref 0.7–4.0)
MCH: 31.5 pg (ref 26.0–34.0)
MCHC: 32.8 g/dL (ref 30.0–36.0)
MCV: 96.1 fL (ref 80.0–100.0)
Monocytes Absolute: 0.5 10*3/uL (ref 0.1–1.0)
Monocytes Relative: 8 %
Neutro Abs: 3.5 10*3/uL (ref 1.7–7.7)
Neutrophils Relative %: 61 %
Platelets: 212 10*3/uL (ref 150–400)
RBC: 3.55 MIL/uL — ABNORMAL LOW (ref 3.87–5.11)
RDW: 14.6 % (ref 11.5–15.5)
WBC: 5.7 10*3/uL (ref 4.0–10.5)
nRBC: 0 % (ref 0.0–0.2)

## 2021-06-09 LAB — COMPREHENSIVE METABOLIC PANEL
ALT: 14 U/L (ref 0–44)
AST: 18 U/L (ref 15–41)
Albumin: 3.6 g/dL (ref 3.5–5.0)
Alkaline Phosphatase: 71 U/L (ref 38–126)
Anion gap: 9 (ref 5–15)
BUN: 14 mg/dL (ref 8–23)
CO2: 26 mmol/L (ref 22–32)
Calcium: 8.5 mg/dL — ABNORMAL LOW (ref 8.9–10.3)
Chloride: 105 mmol/L (ref 98–111)
Creatinine, Ser: 1.01 mg/dL — ABNORMAL HIGH (ref 0.44–1.00)
GFR, Estimated: >60 mL/min (ref >60)
Glucose, Bld: 123 mg/dL — ABNORMAL HIGH (ref 70–99)
Potassium: 3.9 mmol/L (ref 3.5–5.1)
Sodium: 140 mmol/L (ref 135–145)
Total Bilirubin: 0.6 mg/dL (ref 0.3–1.2)
Total Protein: 6.4 g/dL — ABNORMAL LOW (ref 6.5–8.1)

## 2021-06-09 MED ORDER — ACETAMINOPHEN 325 MG PO TABS
650.0000 mg | ORAL_TABLET | Freq: Once | ORAL | Status: AC
  Administered 2021-06-09: 650 mg via ORAL
  Filled 2021-06-09: qty 2

## 2021-06-09 MED ORDER — FAMOTIDINE 20 MG IN NS 100 ML IVPB
20.0000 mg | Freq: Once | INTRAVENOUS | Status: AC
  Administered 2021-06-09: 20 mg via INTRAVENOUS
  Filled 2021-06-09: qty 20

## 2021-06-09 MED ORDER — SODIUM CHLORIDE 0.9% FLUSH
10.0000 mL | Freq: Once | INTRAVENOUS | Status: AC
  Administered 2021-06-09: 10 mL via INTRAVENOUS
  Filled 2021-06-09: qty 10

## 2021-06-09 MED ORDER — HEPARIN SOD (PORK) LOCK FLUSH 100 UNIT/ML IV SOLN
500.0000 [IU] | Freq: Once | INTRAVENOUS | Status: AC
  Filled 2021-06-09: qty 5

## 2021-06-09 MED ORDER — DIPHENHYDRAMINE HCL 50 MG/ML IJ SOLN
25.0000 mg | Freq: Once | INTRAMUSCULAR | Status: AC
  Administered 2021-06-09: 25 mg via INTRAVENOUS
  Filled 2021-06-09: qty 1

## 2021-06-09 MED ORDER — SODIUM CHLORIDE 0.9 % IV SOLN
65.0000 mg/m2 | Freq: Once | INTRAVENOUS | Status: AC
  Administered 2021-06-09: 156 mg via INTRAVENOUS
  Filled 2021-06-09: qty 26

## 2021-06-09 MED ORDER — HEPARIN SOD (PORK) LOCK FLUSH 100 UNIT/ML IV SOLN
INTRAVENOUS | Status: AC
  Administered 2021-06-09: 500 [IU] via INTRAVENOUS
  Filled 2021-06-09: qty 5

## 2021-06-09 MED ORDER — SODIUM CHLORIDE 0.9 % IV SOLN
10.0000 mg | Freq: Once | INTRAVENOUS | Status: AC
  Administered 2021-06-09: 10 mg via INTRAVENOUS
  Filled 2021-06-09: qty 10

## 2021-06-09 MED ORDER — TRASTUZUMAB-ANNS CHEMO 150 MG IV SOLR
2.0000 mg/kg | Freq: Once | INTRAVENOUS | Status: AC
  Administered 2021-06-09: 252 mg via INTRAVENOUS
  Filled 2021-06-09: qty 12

## 2021-06-09 MED ORDER — SODIUM CHLORIDE 0.9 % IV SOLN
Freq: Once | INTRAVENOUS | Status: AC
Start: 2021-06-09 — End: 2021-06-09
  Filled 2021-06-09: qty 250

## 2021-06-09 NOTE — Progress Notes (Signed)
Requirements for power Wheelchair reviewed today with patient. Patient's weight at today's visit is 276.9 lb, height is 5'5". Per mobility assessment performed today, patient has upper and lower extremity weakness 5/5.  The power chair will assist patient in the home with ADL's such and transporting from room to room, cleaning, and toileting. Tiller Type sterring scooter will not be adequate for patient as it will be more difficult for her to get in and out off scooter. Patient has constant pain to knees and back 9/10 that will make cane, walker or manual wheelchair not adequate for patient.  Patient is mentally capable of operating power wheelchair safely and is willing to use in home.   [name] NPI [id number]

## 2021-06-09 NOTE — Patient Instructions (Signed)
[facility]  Discharge Instructions: Thank you for choosing [facility] to provide your oncology and hematology care.  If you have a lab appointment with the [facility], please go directly to the [facility] and check in at the registration area.  Wear comfortable clothing and clothing appropriate for easy access to any Portacath or PICC line.   We strive to give you quality time with your provider. You may need to reschedule your appointment if you arrive late (15 or more minutes).  Arriving late affects you and other patients whose appointments are after yours.  Also, if you miss three or more appointments without notifying the office, you may be dismissed from the clinic at the provider's discretion.      For prescription refill requests, have your pharmacy contact our office and allow 72 hours for refills to be completed.    Today you received the following chemotherapy and/or immunotherapy agents TAXOL and KANJINTI      To help prevent nausea and vomiting after your treatment, we encourage you to take your nausea medication as directed.  BELOW ARE SYMPTOMS THAT SHOULD BE REPORTED IMMEDIATELY: *FEVER GREATER THAN 100.4 F (38 C) OR HIGHER *CHILLS OR SWEATING *NAUSEA AND VOMITING THAT IS NOT CONTROLLED WITH YOUR NAUSEA MEDICATION *UNUSUAL SHORTNESS OF BREATH *UNUSUAL BRUISING OR BLEEDING *URINARY PROBLEMS (pain or burning when urinating, or frequent urination) *BOWEL PROBLEMS (unusual diarrhea, constipation, pain near the anus) TENDERNESS IN MOUTH AND THROAT WITH OR WITHOUT PRESENCE OF ULCERS (sore throat, sores in mouth, or a toothache) UNUSUAL RASH, SWELLING OR PAIN  UNUSUAL VAGINAL DISCHARGE OR ITCHING   Items with * indicate a potential emergency and should be followed up as soon as possible or go to the Emergency Department if any problems should occur.  Please show the CHEMOTHERAPY ALERT CARD or IMMUNOTHERAPY ALERT CARD at  check-in to the Emergency Department and triage nurse.  Should you have questions after your visit or need to cancel or reschedule your appointment, please contact [facility]  [phone] and follow the prompts.  Office hours are 8:00 a.m. to 4:30 p.m. Monday - Friday. Please note that voicemails left after 4:00 p.m. may not be returned until the following business day.  We are closed weekends and major holidays. You have access to a nurse at all times for urgent questions. Please call the main number to the clinic [phone] and follow the prompts.  For any non-urgent questions, you may also contact your provider using MyChart. We now offer e-Visits for anyone 68 and older to request care online for non-urgent symptoms. For details visit [email or web address].   Also download the MyChart app! Go to the app store, search "MyChart", open the app, select [facility], and log in with your MyChart username and password.  Due to Covid, a mask is required upon entering the hospital/clinic. If you do not have a mask, one will be given to you upon arrival. For doctor visits, patients may have 1 support person aged 92 or older with them. For treatment visits, patients cannot have anyone with them due to current Covid guidelines and our immunocompromised population.   Paclitaxel injection What is this medication? PACLITAXEL (PAK li TAX el) is a chemotherapy drug. It targets fast dividing cells, like cancer cells, and causes these cells to die. This medicine is used to treat ovarian cancer, breast cancer, lung cancer, Kaposi's sarcoma, and other cancers. This medicine may be used for other  purposes; ask your health care provider or pharmacist if you have questions. COMMON BRAND NAME(S): Onxol, Taxol What should I tell my care team before I take this medication? They need to know if you have any of these conditions: history of irregular heartbeat liver disease low  blood counts, like low white cell, platelet, or red cell counts lung or breathing disease, like asthma tingling of the fingers or toes, or other nerve disorder an unusual or allergic reaction to paclitaxel, alcohol, polyoxyethylated castor oil, other chemotherapy, other medicines, foods, dyes, or preservatives pregnant or trying to get pregnant breast-feeding How should I use this medication? This drug is given as an infusion into a vein. It is administered in a hospital or clinic by a specially trained health care professional. Talk to your pediatrician regarding the use of this medicine in children. Special care may be needed. Overdosage: If you think you have taken too much of this medicine contact a poison control center or emergency room at once. NOTE: This medicine is only for you. Do not share this medicine with others. What if I miss a dose? It is important not to miss your dose. Call your doctor or health care professional if you are unable to keep an appointment. What may interact with this medication? Do not take this medicine with any of the following medications: live virus vaccines This medicine may also interact with the following medications: antiviral medicines for hepatitis, HIV or AIDS certain antibiotics like erythromycin and clarithromycin certain medicines for fungal infections like ketoconazole and itraconazole certain medicines for seizures like carbamazepine, phenobarbital, phenytoin gemfibrozil nefazodone rifampin St. John's wort This list may not describe all possible interactions. Give your health care provider a list of all the medicines, herbs, non-prescription drugs, or dietary supplements you use. Also tell them if you smoke, drink alcohol, or use illegal drugs. Some items may interact with your medicine. What should I watch for while using this medication? Your condition will be monitored carefully while you are receiving this medicine. You will need  important blood work done while you are taking this medicine. This medicine can cause serious allergic reactions. To reduce your risk you will need to take other medicine(s) before treatment with this medicine. If you experience allergic reactions like skin rash, itching or hives, swelling of the face, lips, or tongue, tell your doctor or health care professional right away. In some cases, you may be given additional medicines to help with side effects. Follow all directions for their use. This drug may make you feel generally unwell. This is not uncommon, as chemotherapy can affect healthy cells as well as cancer cells. Report any side effects. Continue your course of treatment even though you feel ill unless your doctor tells you to stop. Call your doctor or health care professional for advice if you get a fever, chills or sore throat, or other symptoms of a cold or flu. Do not treat yourself. This drug decreases your body's ability to fight infections. Try to avoid being around people who are sick. This medicine may increase your risk to bruise or bleed. Call your doctor or health care professional if you notice any unusual bleeding. Be careful brushing and flossing your teeth or using a toothpick because you may get an infection or bleed more easily. If you have any dental work done, tell your dentist you are receiving this medicine. Avoid taking products that contain aspirin, acetaminophen, ibuprofen, naproxen, or ketoprofen unless instructed by your doctor. These medicines may  hide a fever. Do not become pregnant while taking this medicine. Women should inform their doctor if they wish to become pregnant or think they might be pregnant. There is a potential for serious side effects to an unborn child. Talk to your health care professional or pharmacist for more information. Do not breast-feed an infant while taking this medicine. Men are advised not to father a child while receiving this  medicine. This product may contain alcohol. Ask your pharmacist or healthcare provider if this medicine contains alcohol. Be sure to tell all healthcare providers you are taking this medicine. Certain medicines, like metronidazole and disulfiram, can cause an unpleasant reaction when taken with alcohol. The reaction includes flushing, headache, nausea, vomiting, sweating, and increased thirst. The reaction can last from 30 minutes to several hours. What side effects may I notice from receiving this medication? Side effects that you should report to your doctor or health care professional as soon as possible: allergic reactions like skin rash, itching or hives, swelling of the face, lips, or tongue breathing problems changes in vision fast, irregular heartbeat high or low blood pressure mouth sores pain, tingling, numbness in the hands or feet signs of decreased platelets or bleeding - bruising, pinpoint red spots on the skin, black, tarry stools, blood in the urine signs of decreased red blood cells - unusually weak or tired, feeling faint or lightheaded, falls signs of infection - fever or chills, cough, sore throat, pain or difficulty passing urine signs and symptoms of liver injury like dark yellow or brown urine; general ill feeling or flu-like symptoms; light-colored stools; loss of appetite; nausea; right upper belly pain; unusually weak or tired; yellowing of the eyes or skin swelling of the ankles, feet, hands unusually slow heartbeat Side effects that usually do not require medical attention (report to your doctor or health care professional if they continue or are bothersome): diarrhea hair loss loss of appetite muscle or joint pain nausea, vomiting pain, redness, or irritation at site where injected tiredness This list may not describe all possible side effects. Call your doctor for medical advice about side effects. You may report side effects to FDA at 1-800-FDA-1088. Where  should I keep my medication? This drug is given in a hospital or clinic and will not be stored at home. NOTE: This sheet is a summary. It may not cover all possible information. If you have questions about this medicine, talk to your doctor, pharmacist, or health care provider.  2022 [employer]/Gold Standard (2021-03-16 00:00:00) Trastuzumab injection for infusion What is this medication? TRASTUZUMAB (tras TOO zoo mab) is a monoclonal antibody. It is used to treat breast cancer and stomach cancer. This medicine may be used for other purposes; ask your health care provider or pharmacist if you have questions. COMMON BRAND NAME(S): Herceptin, Janae Bridgeman, Ontruzant, Trazimera What should I tell my care team before I take this medication? They need to know if you have any of these conditions: heart disease heart failure lung or breathing disease, like asthma an unusual or allergic reaction to trastuzumab, benzyl alcohol, or other medications, foods, dyes, or preservatives pregnant or trying to get pregnant breast-feeding How should I use this medication? This drug is given as an infusion into a vein. It is administered in a hospital or clinic by a specially trained health care professional. Talk to your pediatrician regarding the use of this medicine in children. This medicine is not approved for use in children. Overdosage: If you think you have taken  too much of this medicine contact a poison control center or emergency room at once. NOTE: This medicine is only for you. Do not share this medicine with others. What if I miss a dose? It is important not to miss a dose. Call your doctor or health care professional if you are unable to keep an appointment. What may interact with this medication? This medicine may interact with the following medications: certain types of chemotherapy, such as daunorubicin, doxorubicin, epirubicin, and idarubicin This list may not describe all  possible interactions. Give your health care provider a list of all the medicines, herbs, non-prescription drugs, or dietary supplements you use. Also tell them if you smoke, drink alcohol, or use illegal drugs. Some items may interact with your medicine. What should I watch for while using this medication? Visit your doctor for checks on your progress. Report any side effects. Continue your course of treatment even though you feel ill unless your doctor tells you to stop. Call your doctor or health care professional for advice if you get a fever, chills or sore throat, or other symptoms of a cold or flu. Do not treat yourself. Try to avoid being around people who are sick. You may experience fever, chills and shaking during your first infusion. These effects are usually mild and can be treated with other medicines. Report any side effects during the infusion to your health care professional. Fever and chills usually do not happen with later infusions. Do not become pregnant while taking this medicine or for 7 months after stopping it. Women should inform their doctor if they wish to become pregnant or think they might be pregnant. Women of child-bearing potential will need to have a negative pregnancy test before starting this medicine. There is a potential for serious side effects to an unborn child. Talk to your health care professional or pharmacist for more information. Do not breast-feed an infant while taking this medicine or for 7 months after stopping it. Women must use effective birth control with this medicine. What side effects may I notice from receiving this medication? Side effects that you should report to your doctor or health care professional as soon as possible: allergic reactions like skin rash, itching or hives, swelling of the face, lips, or tongue chest pain or palpitations cough dizziness feeling faint or lightheaded, falls fever general ill feeling or flu-like symptoms signs  of worsening heart failure like breathing problems; swelling in your legs and feet unusually weak or tired Side effects that usually do not require medical attention (report to your doctor or health care professional if they continue or are bothersome): bone pain changes in taste diarrhea joint pain nausea/vomiting weight loss This list may not describe all possible side effects. Call your doctor for medical advice about side effects. You may report side effects to FDA at 1-800-FDA-1088. Where should I keep my medication? This drug is given in a hospital or clinic and will not be stored at home. NOTE: This sheet is a summary. It may not cover all possible information. If you have questions about this medicine, talk to your doctor, pharmacist, or health care provider.  2022 [employer]vier/Gold Standard (2016-07-12 00:00:00)

## 2021-06-09 NOTE — Progress Notes (Signed)
Hematology/Oncology Progress note [phone] [phone]   Patient Care Team: [name], FNP as PCP - General (Family Medicine) Amber Barnes Amber November, MD as PCP - Cardiology (Cardiology) Charletta Cousin, MD (Inactive) Amber Barnes, Amber November, MD as Consulting Physician (Cardiology) Rico Junker, RN as Oncology Nurse Navigator [name], MD as Consulting Physician (Hematology and Oncology)  REFERRING PROVIDER: [name], FNP  CHIEF COMPLAINTS/REASON FOR VISIT:  Follow-up for stage I HER2 positive breast cancer  HISTORY OF PRESENTING ILLNESS:   Amber Barnes is a  70 y.o.  female with PMH listed below was seen in consultation at the request of  [name], FNP  for evaluation of weight loss, family history ovarian cancer. Patient reports a history of nausea vomiting, early satiety, dysphagia symptoms that started in March.  She has lost significant amount of weight as well. Patient weighed 340 pounds in February 2021, 309 pounds in May 2021 and currently she weighs 296 pounds today. Patient has tried Zofran and scopolamine patch which did not relieve her symptoms. 11/05/2019, CT abdomen pelvis showed chronic changes with out acute abnormality to correspond with the patient's symptoms. Patient has had right upper quadrant ultrasound done on 11/14/2019, which showed hepatic steatosis with no other acute abnormality. Patient was seen by gastroenterology Dr. Alice Barnes on 11/18/2019.  Patient was recommended to take PPI daily.  Added Carafate.  H. pylori testing came back positive and the patient was started on a course of tetracycline, metronidazole, Pepto-Bismol for H. pylori treatments.  Patient reports that she has been on the treatment for 7 days now.  Nausea is slightly better but still persistent.  She is following up with gastroenterology next week for discussion of EGD. Currently she is not able to tolerate colonoscopy prep due to the nausea.  Her stool  was tested positive for occult blood. She also reports night sweating which is a chronic symptom for her. Patient was last seen on 05/20/2020 for evaluation of weight loss, family history of ovarian cancer patient was referred to establish care with genetic counselor.  Patient eventually declined genetic testing.  12/30/2020, patient had a screening mammogram done which showed possible mass and asymmetry in the right breast. 01/08/2021, diagnostic mammogram of the right breast showed 2 adjacent irregular spiculated masses.  Larger 1 was 10 mm and a smaller mass measured 5 mm. 01/15/2021 stereotactic biopsy of the right breast smaller mass showed invasive carcinoma no special type, grade 2, DCIS present, LVI negative. Ultrasound guided biopsy of the larger mass showed invasive mammary carcinoma,   Patient has appointment with surgery Dr. Christian Barnes later this week.  She was referred to establish care with oncology for evaluation.  She was accompanied by daughter. She has no new complaints.  Family history of breast cancer: Denies Family history of other cancers: Patient has a strong family history of leukemia in her mother, colon cancer in her father at age of 55, ovarian cancer in her sister at age of 67, CLL in her brother  02/17/2021, patient is status post lumpectomy and sentinel lymph node biopsy Pathology showed invasive mammary carcinoma, 2 separate foci, DCIS, negative sentinel lymph node biopsy 0/1, grade 3, or margins negative for invasive carcinoma.  ER -, PR + [1-10%], HER2 +  pT1b(m) pN0   # Strong family history of cancer, Discussed again about genetic testing. patient adamantly declined.  #  Intermittent dizziness which per patient is chronic for her due to the vertigo. #03/23/2021 Mediport by vascular surgeon #History  of CHF, 03/08/2021, echocardiogram showed LVEF 60-65%. Per patient, she has been seen by Dr. [name] team and was cleared for proceeding with chemotherapy.   03/24/2021  - 03/31/2021 Taxol 80 mg/m2 [name] 04/05/2021 - 04/09/2021 patient was admitted due to neutropenic fever with associated SIRS.  Patient was treated with empiric cefepime and vancomycin until date of discharge when [place] recovered to 1.4.  Post hospitalization, patient also had COVID-19 infection so her follow-up appointment was further postponed for another 2 weeks.  05/10/2021 resumed dose reduce Taxol 19m/m2 /[name] INTERVAL HISTORY JEllakate Gonsalvesis a 70y.o. female who has above history reviewed by me today presents for follow up visit for management of right side HER 2 positive breast cancer Patient tolerates dose reduced Taxol and trastuzumab chemotherapy well.  Denies any nausea vomiting diarrhea, fever or chills.  Pre-existing neuropathy, stable.  Bilateral lower extremity aches and pains  Review of Systems  Constitutional:  Positive for fatigue. Negative for appetite change, chills, fever and unexpected weight change.       Patient sits in the wheelchair  HENT:   Negative for hearing loss and voice change.   Eyes:  Negative for eye problems.  Respiratory:  Negative for chest tightness and cough.   Cardiovascular:  Negative for chest pain.  Gastrointestinal:  Negative for abdominal distention, abdominal pain, blood in stool, nausea and vomiting.  Endocrine: Negative for hot flashes.  Genitourinary:  Negative for difficulty urinating and frequency.   Musculoskeletal:  Negative for arthralgias.  Skin:  Negative for itching and rash.  Neurological:  Positive for numbness. Negative for dizziness and extremity weakness.  Hematological:  Negative for adenopathy.  Psychiatric/Behavioral:  Negative for confusion.    MEDICAL HISTORY:  Past Medical History:  Diagnosis Date   Achilles tendinitis    Acute medial meniscal tear    Allergy    Anxiety    Atrial fibrillation ([place]    a. new onset 07/2016; b. CHADS2VASc --> 4 (HTN, female, age, CHF); c. eliquis   CHF (congestive  heart failure) (HCC)    Chronic anticoagulation    Apixaban   Chronic lower back pain    Colon polyp    Complication of anesthesia    Emergence delirium; hypoxia   [place]    Cystic breast    Depression    Endometriosis    Fibrocystic breast disease    GERD (gastroesophageal reflux disease)    History of stress test    a. 07/2016 MV: EF 55-65%. Small, mild defect  in mid anteroseptal, apical septal, and apical locations. No ischemia-->low risk.   Hyperlipidemia    Hypertension    Insomnia    Lipoma    Malignant neoplasm of upper-outer quadrant of right female breast (HCC)    Stage 1A invasive mammary carcinoma (cT1b or T1c N0); ER (-), PR (+), HER2/neu (+)   Migraine    Mild Mitral regurgitation    a. 07/2016 Echo: EF 55-65%, no rwma, mild MR. Mildly dil LA. Nl RV fx.    OSA (obstructive sleep apnea)    non-compliant with nocturnal PAP therapy   Osteoarthritis    left knee   Osteopenia    Peptic ulcer disease    a. H. pylori   Pneumonia    RLS (restless legs syndrome)    Tendonitis    Thyroid disease    Vitamin D deficiency     SURGICAL HISTORY: Past Surgical History:  Procedure Laterality Date   ABDOMINAL  HYSTERECTOMY     ovaries left   ACHILLES TENDON SURGERY     2012,01/2017   BACK SURGERY     fusion lumbar   BREAST BIOPSY Right 01/15/2021   Affirm biopsy-"X" clip-INVASIVE MAMMARY CARCINOMA   BREAST BIOPSY Right 01/15/2021   u/s bx-"venus" clip INVASIVE MAMMARY CARCINOMA   BREAST BIOPSY     BREAST LUMPECTOMY,RADIO FREQ LOCALIZER,AXILLARY SENTINEL LYMPH NODE BIOPSY Right 02/17/2021   Procedure: BREAST LUMPECTOMY,RADIO FREQ LOCALIZER,AXILLARY SENTINEL LYMPH NODE BIOPSY;  Surgeon: Ronny Bacon, MD;  Location: [facility];  Service: General;  Laterality: Right;   CARDIOVERSION N/A 09/07/2016   Procedure: CARDIOVERSION;  Surgeon: Minna Merritts, MD;  Location: [facility];  Service: Cardiovascular;  Laterality: N/A;    CARDIOVERSION N/A 10/19/2016   Procedure: CARDIOVERSION;  Surgeon: Minna Merritts, MD;  Location: [facility];  Service: Cardiovascular;  Laterality: N/A;   CARDIOVERSION N/A 12/14/2016   Procedure: CARDIOVERSION;  Surgeon: Minna Merritts, MD;  Location: [facility];  Service: Cardiovascular;  Laterality: N/A;   COLONOSCOPY     COLONOSCOPY WITH PROPOFOL N/A 05/13/2020   Procedure: COLONOSCOPY WITH PROPOFOL;  Surgeon: Toledo, Benay Pike, MD;  Location: [facility];  Service: Gastroenterology;  Laterality: N/A;  ELIQUIS   ESOPHAGOGASTRODUODENOSCOPY     ESOPHAGOGASTRODUODENOSCOPY (EGD) WITH PROPOFOL N/A 05/13/2020   Procedure: ESOPHAGOGASTRODUODENOSCOPY (EGD) WITH PROPOFOL;  Surgeon: Toledo, Benay Pike, MD;  Location: [facility];  Service: Gastroenterology;  Laterality: N/A;   FOOT SURGERY Left    tendon   JOINT REPLACEMENT Right    hip   PORTA CATH INSERTION N/A 03/23/2021   Procedure: PORTA CATH INSERTION;  Surgeon: Katha Cabal, MD;  Location: [facility];  Service: Cardiovascular;  Laterality: N/A;   PORTACATH PLACEMENT N/A 03/23/2021   Procedure: ATTEMPTED INSERTION PORT-A-CATH;  Surgeon: Ronny Bacon, MD;  Location: [facility];  Service: General;  Laterality: N/A;   THYROIDECTOMY     thyroid lobectomy secondary to a benign nodule   TOTAL HIP ARTHROPLASTY Right 08/21/2018   Procedure: TOTAL HIP ARTHROPLASTY ANTERIOR APPROACH-RIGHT CELL SAVER REQUESTED;  Surgeon: [name], MD;  Location: [facility];  Service: Orthopedics;  Laterality: Right;    SOCIAL HISTORY: Social History   Socioeconomic History   Marital status: Married    Spouse name: jeffrey   Number of children: 1   Years of education: Not on file   Highest education level: High school graduate  Occupational History    Comment: disabled  Tobacco Use   Smoking status: Former    Packs/day: 0.25    Years: 10.00    Pack years: 2.50    Types: Cigarettes    Quit date: 2005    Years since quitting:  17.9   Smokeless tobacco: Never   Tobacco comments:    smoked for 10 years, 1 pack every 2-3 days  Vaping Use   Vaping Use: Never used  Substance and Sexual Activity   Alcohol use: Not Currently    Alcohol/week: 1.0 standard drink    Types: 1 Glasses of wine per week    Comment: rarely   Drug use: No   Sexual activity: Not Currently  Other Topics Concern   Not on file  Social History Narrative   Moved to [place] from [place], originally from [place].    1 cup of caffeine daily   Right handed          Social Determinants of Health   Financial Resource Strain: Low Risk    Difficulty  of Paying Living Expenses: Not very hard  Food Insecurity: No Food Insecurity   Worried About [place] in the Last Year: Never true   Ran Out of Food in the Last Year: Never true  Transportation Needs: No Transportation Needs   Lack of Transportation (Medical): No   Lack of Transportation (Non-Medical): No  Physical Activity: Unknown   Days of Exercise per Week: 0 days   Minutes of Exercise per Session: Not on file  Stress: Stress Concern Present   Feeling of Stress : Rather much  Social Connections: Moderately Isolated   Frequency of Communication with Friends and Family: Never   Frequency of Social Gatherings with Friends and Family: Once a week   Attends Religious Services: More than 4 times per year   Active Member of [employer] or Organizations: No   Attends Music therapist: Never   Marital Status: Married  Human resources officer Violence: Not At Risk   Fear of Current or Ex-Partner: No   Emotionally Abused: No   Physically Abused: No   Sexually Abused: No    FAMILY HISTORY: Family History  Problem Relation Age of Onset   Cancer Sister 5       ovarian   Depression Sister    Cancer Brother        CLL   Cancer Father 49       colon   Heart disease Father    Heart attack Father 59       died from MI   Hyperlipidemia Father     Hypertension Father    Cancer Mother        leukemia   Breast cancer Paternal Aunt        48's   Migraines Neg Hx     ALLERGIES:  is allergic to prednisone, amiodarone, hydrocodone-acetaminophen, oxycodone, tapentadol, tramadol, adhesive [tape], and latex.  MEDICATIONS:  Current Outpatient Medications  Medication Sig Dispense Refill   acetaminophen (TYLENOL) 500 MG tablet Take 500 mg by mouth every 6 (six) hours as needed. Taking as needed     albuterol (VENTOLIN HFA) 108 (90 Base) MCG/ACT inhaler INHALE 2 PUFFS INTO THE LUNGS EVERY 8 (EIGHT) HOURS AS NEEDED FOR WHEEZING OR SHORTNESS OF BREATH. 6.7 g 0   ALPRAZolam (XANAX) 0.25 MG tablet Take 0.25 mg by mouth at bedtime as needed for anxiety.     azelastine (ASTELIN) 0.1 % nasal spray Place into both nostrils as needed for rhinitis. Use in each nostril as directed     B Complex Vitamins (VITAMIN B COMPLEX PO) Take 1 tablet by mouth daily.      bisoprolol (ZEBETA) 5 MG tablet Take 1 tablet (5 mg total) by mouth 2 (two) times daily. 180 tablet 1   busPIRone (BUSPAR) 5 MG tablet TAKE 1 TABLET BY MOUTH THREE TIMES A DAY AS NEEDED 60 tablet 1   Cholecalciferol (VITAMIN D3) 5000 units CAPS Take 5,000 Units by mouth daily.     DULoxetine (CYMBALTA) 30 MG capsule Take 1 capsule (30 mg total) by mouth 2 (two) times daily. Home med.     ELIQUIS 5 MG TABS tablet TAKE 1 TABLET BY MOUTH TWICE A DAY 180 tablet 1   esomeprazole (NEXIUM) 20 MG capsule Take 20 mg by mouth as needed.     furosemide (LASIX) 20 MG tablet Take 1 tablet (20 mg total) by mouth 2 (two) times daily. 180 tablet 0   lidocaine-prilocaine (EMLA) cream Apply to affected area once 30 g 3  loperamide (IMODIUM) 2 MG capsule Take 1 capsule (2 mg total) by mouth See admin instructions. Initial: 4 mg, followed by 2 mg after each loose stool; maximum: 16 mg/day 60 capsule 1   losartan (COZAAR) 25 MG tablet Take 1 tablet (25 mg total) by mouth daily. 90 tablet 3   Multiple Vitamin  (MULTIVITAMIN) tablet Take 1 tablet by mouth daily.     nystatin (MYCOSTATIN/NYSTOP) powder Apply 1 application topically 3 (three) times daily. 15 g 1   ondansetron (ZOFRAN) 8 MG tablet Take 1 tablet (8 mg total) by mouth every 8 (eight) hours as needed for nausea or vomiting. 60 tablet 0   potassium chloride SA (KLOR-CON) 20 MEQ tablet Take 1 tablet (20 mEq total) by mouth 2 (two) times daily. 120 tablet 2   prochlorperazine (COMPAZINE) 10 MG tablet Take 1 tablet (10 mg total) by mouth every 6 (six) hours as needed (Nausea or vomiting). 30 tablet 1   promethazine (PHENERGAN) 25 MG tablet Take 1 tablet (25 mg total) by mouth every 6 (six) hours as needed for nausea or vomiting. 60 tablet 0   ranolazine (RANEXA) 500 MG 12 hr tablet TAKE 1 TABLET BY MOUTH TWICE A DAY 180 tablet 0   rizatriptan (MAXALT) 10 MG tablet TAKE 1 TABLET (10 MG TOTAL) BY MOUTH AS NEEDED FOR MIGRAINE. MAY REPEAT IN 2 HOURS IF NEEDED 10 tablet 0   rOPINIRole (REQUIP) 0.5 MG tablet Take 1 tablet (0.5 mg total) by mouth at bedtime. 90 tablet 0   rOPINIRole (REQUIP) 1 MG tablet TAKE 1 TABLET BY MOUTH AT BEDTIME. 90 tablet 1   rosuvastatin (CRESTOR) 20 MG tablet TAKE 1 TABLET BY MOUTH EVERY DAY 90 tablet 0   No current facility-administered medications for this visit.     PHYSICAL EXAMINATION: ECOG PERFORMANCE STATUS: 2 - Symptomatic, <50% confined to bed Vitals:   06/09/21 0933  BP: 131/62  Pulse: 95  Resp: 18  Temp: 98.1 F (36.7 C)   Filed Weights   06/09/21 0933  Weight: 276 lb 14.4 oz (125.6 kg)    Physical Exam Constitutional:      General: She is not in acute distress.    Appearance: She is obese.  HENT:     Head: Normocephalic and atraumatic.  Eyes:     General: No scleral icterus. Cardiovascular:     Rate and Rhythm: Normal rate and regular rhythm.     Heart sounds: Normal heart sounds.  Pulmonary:     Effort: Pulmonary effort is normal. No respiratory distress.     Breath sounds: No wheezing.   Abdominal:     General: Bowel sounds are normal. There is no distension.     Palpations: Abdomen is soft.  Musculoskeletal:        General: No deformity. Normal range of motion.     Cervical back: Normal range of motion and neck supple.  Skin:    General: Skin is warm and dry.     Findings: No erythema or rash.  Neurological:     Mental Status: She is alert and oriented to person, place, and time. Mental status is at baseline.     Cranial Nerves: No cranial nerve deficit.     Coordination: Coordination normal.  Psychiatric:        Mood and Affect: Mood normal.    . LABORATORY DATA:  I have reviewed the data as listed Lab Results  Component Value Date   WBC 5.7 06/09/2021   HGB 11.2 (L)  06/09/2021   HCT 34.1 (L) 06/09/2021   MCV 96.1 06/09/2021   PLT 212 06/09/2021   Recent Labs    05/18/21 0850 05/25/21 1317 06/09/21 0902  NA 139 136 140  K 3.9 3.9 3.9  CL 103 98 105  CO2 _0 GLUCOSE 91 109* 123*  BUN _1 CREATININE 0.88 0.73 1.01*  CALCIUM 8.4* 8.7* 8.5*  GFRNONAA >60 >60 >60  PROT 6.6 7.1 6.4*  ALBUMIN 3.5 3.7 3.6  AST _2 ALT _3 ALKPHOS 67 70 71  BILITOT 0.8 0.8 0.6    Iron/TIBC/Ferritin/ %Sat    Component Value Date/Time   IRON 72 03/31/2021 0856   TIBC 364 03/31/2021 0856   FERRITIN 291 03/31/2021 0856   IRONPCTSAT 20 03/31/2021 0856      RADIOGRAPHIC STUDIES: I have personally reviewed the radiological images as listed and agreed with the findings in the report. No results found.    ASSESSMENT & PLAN:  1. HER2-positive carcinoma of breast ([place])   2. Encounter for antineoplastic chemotherapy   3. Neuropathy   4. Anemia due to antineoplastic chemotherapy   5. Chemotherapy induced diarrhea   6. Chemotherapy induced nausea and vomiting   7. Family history of ovarian cancer    Cancer Staging  Malignant neoplasm of upper-outer quadrant of right female breast [facility]) Staging form: Breast, AJCC 8th Edition -  Pathologic stage from 03/03/2021: Stage IA (pT1b, pN0, cM0, G3, ER-, PR+, HER2+) - Signed by [name], MD on 03/03/2021  #Right breast invasive carcinoma pT1b pN0 ER negative, PR weakly HER2 positive positive, HER 2 positive.  Patient tolerates dose reduced Taxol and trastuzumab. Labs reviewed and discussed with patient. Proceed with trastuzumab and Taxol-62m/m2 Decrease premed benadryl to 260mdue to restless leg syndrome  # chemotherapy induced nausea and vomiting.   Symptom has resolved.  Patient has as needed antiemetics at home.  #Chemotherapy-induced diarrhea, stable.  Imodium as needed. #Chemotherapy-induced neutropenia, resolved.  Patient has history of CHF, Repeat echocardiogram showed normal LVEF. Follow-up with cardiologist Dr. GoRockey Barnes Mildly elevated creatinine could be secondary to diuretics.  Encourage ongoing hydration   #Pre-existing neuropathy in her fingertips bilaterally-Per patient this is secondary to cervical spine radiculopathy Patient is made aware that neuropathy may get worse secondary to chemotherapy  #Chemotherapy-induced anemia, hemoglobin has been stable.  Monitor. # she declined genetic testing  Supportive care measures are necessary for patient well-being and will be provided as necessary. We spent sufficient time to discuss many aspect of care, questions were answered to patient's satisfaction.   All questions were answered. The patient knows to call the clinic with any problems questions or concerns.  cc ArBurnard HawthorneFNP  Follow-up 2 week, Taxol and trastuzumab.   ZhEarlie ServerMD, PhD 06/09/2021

## 2021-06-09 NOTE — Progress Notes (Signed)
Patient here for follow up. Reports she has been feeling and eating well.

## 2021-06-14 ENCOUNTER — Encounter: Payer: Self-pay | Admitting: Oncology

## 2021-06-14 NOTE — Telephone Encounter (Signed)
Please see pt response.

## 2021-06-16 ENCOUNTER — Inpatient Hospital Stay: Payer: Medicare Other

## 2021-06-16 ENCOUNTER — Inpatient Hospital Stay (HOSPITAL_BASED_OUTPATIENT_CLINIC_OR_DEPARTMENT_OTHER): Payer: Medicare Other | Admitting: Oncology

## 2021-06-16 ENCOUNTER — Encounter: Payer: Self-pay | Admitting: Oncology

## 2021-06-16 ENCOUNTER — Other Ambulatory Visit: Payer: Self-pay

## 2021-06-16 ENCOUNTER — Inpatient Hospital Stay: Payer: Medicare Other | Attending: Oncology

## 2021-06-16 VITALS — BP 119/92 | HR 70

## 2021-06-16 VITALS — BP 121/92 | HR 48 | Temp 98.2°F | Resp 16 | Wt 280.0 lb

## 2021-06-16 DIAGNOSIS — M8580 Other specified disorders of bone density and structure, unspecified site: Secondary | ICD-10-CM | POA: Diagnosis not present

## 2021-06-16 DIAGNOSIS — Z171 Estrogen receptor negative status [ER-]: Secondary | ICD-10-CM | POA: Diagnosis not present

## 2021-06-16 DIAGNOSIS — Z86010 Personal history of colonic polyps: Secondary | ICD-10-CM | POA: Insufficient documentation

## 2021-06-16 DIAGNOSIS — D709 Neutropenia, unspecified: Secondary | ICD-10-CM | POA: Insufficient documentation

## 2021-06-16 DIAGNOSIS — Z95828 Presence of other vascular implants and grafts: Secondary | ICD-10-CM

## 2021-06-16 DIAGNOSIS — D6481 Anemia due to antineoplastic chemotherapy: Secondary | ICD-10-CM

## 2021-06-16 DIAGNOSIS — T451X5A Adverse effect of antineoplastic and immunosuppressive drugs, initial encounter: Secondary | ICD-10-CM | POA: Insufficient documentation

## 2021-06-16 DIAGNOSIS — G629 Polyneuropathy, unspecified: Secondary | ICD-10-CM | POA: Diagnosis not present

## 2021-06-16 DIAGNOSIS — E785 Hyperlipidemia, unspecified: Secondary | ICD-10-CM | POA: Diagnosis not present

## 2021-06-16 DIAGNOSIS — K219 Gastro-esophageal reflux disease without esophagitis: Secondary | ICD-10-CM | POA: Insufficient documentation

## 2021-06-16 DIAGNOSIS — Z806 Family history of leukemia: Secondary | ICD-10-CM | POA: Diagnosis not present

## 2021-06-16 DIAGNOSIS — J449 Chronic obstructive pulmonary disease, unspecified: Secondary | ICD-10-CM | POA: Diagnosis not present

## 2021-06-16 DIAGNOSIS — I4891 Unspecified atrial fibrillation: Secondary | ICD-10-CM | POA: Diagnosis not present

## 2021-06-16 DIAGNOSIS — C50919 Malignant neoplasm of unspecified site of unspecified female breast: Secondary | ICD-10-CM

## 2021-06-16 DIAGNOSIS — C50411 Malignant neoplasm of upper-outer quadrant of right female breast: Secondary | ICD-10-CM | POA: Diagnosis not present

## 2021-06-16 DIAGNOSIS — R197 Diarrhea, unspecified: Secondary | ICD-10-CM | POA: Insufficient documentation

## 2021-06-16 DIAGNOSIS — Z5111 Encounter for antineoplastic chemotherapy: Secondary | ICD-10-CM

## 2021-06-16 DIAGNOSIS — I509 Heart failure, unspecified: Secondary | ICD-10-CM | POA: Diagnosis not present

## 2021-06-16 LAB — COMPREHENSIVE METABOLIC PANEL
ALT: 15 U/L (ref 0–44)
AST: 16 U/L (ref 15–41)
Albumin: 3.5 g/dL (ref 3.5–5.0)
Alkaline Phosphatase: 66 U/L (ref 38–126)
Anion gap: 11 (ref 5–15)
BUN: 22 mg/dL (ref 8–23)
CO2: 24 mmol/L (ref 22–32)
Calcium: 8.9 mg/dL (ref 8.9–10.3)
Chloride: 103 mmol/L (ref 98–111)
Creatinine, Ser: 1.04 mg/dL — ABNORMAL HIGH (ref 0.44–1.00)
GFR, Estimated: 59 mL/min — ABNORMAL LOW (ref >60)
Glucose, Bld: 105 mg/dL — ABNORMAL HIGH (ref 70–99)
Potassium: 4 mmol/L (ref 3.5–5.1)
Sodium: 138 mmol/L (ref 135–145)
Total Bilirubin: 0.7 mg/dL (ref 0.3–1.2)
Total Protein: 6.6 g/dL (ref 6.5–8.1)

## 2021-06-16 LAB — CBC WITH DIFFERENTIAL/PLATELET
Abs Immature Granulocytes: 0.04 10*3/uL (ref 0.00–0.07)
Basophils Absolute: 0.1 10*3/uL (ref 0.0–0.1)
Basophils Relative: 1 %
Eosinophils Absolute: 0.1 10*3/uL (ref 0.0–0.5)
Eosinophils Relative: 1 %
HCT: 34.8 % — ABNORMAL LOW (ref 36.0–46.0)
Hemoglobin: 11.8 g/dL — ABNORMAL LOW (ref 12.0–15.0)
Immature Granulocytes: 1 %
Lymphocytes Relative: 28 %
Lymphs Abs: 2 10*3/uL (ref 0.7–4.0)
MCH: 31.9 pg (ref 26.0–34.0)
MCHC: 33.9 g/dL (ref 30.0–36.0)
MCV: 94.1 fL (ref 80.0–100.0)
Monocytes Absolute: 0.2 10*3/uL (ref 0.1–1.0)
Monocytes Relative: 3 %
Neutro Abs: 4.8 10*3/uL (ref 1.7–7.7)
Neutrophils Relative %: 66 %
Platelets: 210 10*3/uL (ref 150–400)
RBC: 3.7 MIL/uL — ABNORMAL LOW (ref 3.87–5.11)
RDW: 13.6 % (ref 11.5–15.5)
WBC: 7.2 10*3/uL (ref 4.0–10.5)
nRBC: 0 % (ref 0.0–0.2)

## 2021-06-16 MED ORDER — TRASTUZUMAB-ANNS CHEMO 150 MG IV SOLR
2.0000 mg/kg | Freq: Once | INTRAVENOUS | Status: AC
  Administered 2021-06-16: 252 mg via INTRAVENOUS
  Filled 2021-06-16: qty 12

## 2021-06-16 MED ORDER — ACETAMINOPHEN-CODEINE #3 300-30 MG PO TABS: 1.0000 | ORAL_TABLET | Freq: Four times a day (QID) | ORAL | 0 refills | Status: DC | PRN

## 2021-06-16 MED ORDER — SODIUM CHLORIDE 0.9% FLUSH
10.0000 mL | Freq: Once | INTRAVENOUS | Status: AC
  Administered 2021-06-16: 10 mL via INTRAVENOUS
  Filled 2021-06-16: qty 10

## 2021-06-16 MED ORDER — FAMOTIDINE 20 MG IN NS 100 ML IVPB
20.0000 mg | Freq: Once | INTRAVENOUS | Status: AC
  Administered 2021-06-16: 20 mg via INTRAVENOUS
  Filled 2021-06-16: qty 20

## 2021-06-16 MED ORDER — SODIUM CHLORIDE 0.9 % IV SOLN
10.0000 mg | Freq: Once | INTRAVENOUS | Status: AC
  Administered 2021-06-16: 10 mg via INTRAVENOUS
  Filled 2021-06-16: qty 10

## 2021-06-16 MED ORDER — HEPARIN SOD (PORK) LOCK FLUSH 100 UNIT/ML IV SOLN
INTRAVENOUS | Status: AC
  Administered 2021-06-16: 500 [IU]
  Filled 2021-06-16: qty 5

## 2021-06-16 MED ORDER — SODIUM CHLORIDE 0.9 % IV SOLN
Freq: Once | INTRAVENOUS | Status: AC
Start: 2021-06-16 — End: 2021-06-16
  Filled 2021-06-16: qty 250

## 2021-06-16 MED ORDER — HEPARIN SOD (PORK) LOCK FLUSH 100 UNIT/ML IV SOLN
500.0000 [IU] | Freq: Once | INTRAVENOUS | Status: AC | PRN
  Filled 2021-06-16: qty 5

## 2021-06-16 MED ORDER — ACETAMINOPHEN 325 MG PO TABS
650.0000 mg | ORAL_TABLET | Freq: Once | ORAL | Status: AC
  Administered 2021-06-16: 650 mg via ORAL
  Filled 2021-06-16: qty 2

## 2021-06-16 MED ORDER — DIPHENHYDRAMINE HCL 50 MG/ML IJ SOLN
25.0000 mg | Freq: Once | INTRAMUSCULAR | Status: AC
  Administered 2021-06-16: 25 mg via INTRAVENOUS
  Filled 2021-06-16: qty 1

## 2021-06-16 MED ORDER — SODIUM CHLORIDE 0.9 % IV SOLN
65.0000 mg/m2 | Freq: Once | INTRAVENOUS | Status: AC
  Administered 2021-06-16: 156 mg via INTRAVENOUS
  Filled 2021-06-16: qty 26

## 2021-06-16 NOTE — Progress Notes (Signed)
Hematology/Oncology Progress note [phone] [phone]   Patient Care Team: [name], FNP as PCP - General (Family Medicine) Amber Barnes Amber November, MD as PCP - Cardiology (Cardiology) Charletta Cousin, MD (Inactive) Amber Barnes, Amber November, MD as Consulting Physician (Cardiology) Amber Junker, RN as Oncology Nurse Navigator Amber Server, MD as Consulting Physician (Hematology and Oncology)  REFERRING PROVIDER: [name], FNP  CHIEF COMPLAINTS/REASON FOR VISIT:  Follow-up for stage I HER2 positive breast cancer  HISTORY OF PRESENTING ILLNESS:   Amber Barnes is a  70 y.o.  female with PMH listed below was seen in consultation at the request of  [name], FNP  for evaluation of weight loss, family history ovarian cancer. Patient reports a history of nausea vomiting, early satiety, dysphagia symptoms that started in March.  She has lost significant amount of weight as well. Patient weighed 340 pounds in February 2021, 309 pounds in May 2021 and currently she weighs 296 pounds today. Patient has tried Zofran and scopolamine patch which did not relieve her symptoms. 11/05/2019, CT abdomen pelvis showed chronic changes with out acute abnormality to correspond with the patient's symptoms. Patient has had right upper quadrant ultrasound done on 11/14/2019, which showed hepatic steatosis with no other acute abnormality. Patient was seen by gastroenterology Dr. Alice Barnes on 11/18/2019.  Patient was recommended to take PPI daily.  Added Carafate.  H. pylori testing came back positive and the patient was started on a course of tetracycline, metronidazole, Pepto-Bismol for H. pylori treatments.  Patient reports that she has been on the treatment for 7 days now.  Nausea is slightly better but still persistent.  She is following up with gastroenterology next week for discussion of EGD. Currently she is not able to tolerate colonoscopy prep due to the nausea.  Her stool  was tested positive for occult blood. She also reports night sweating which is a chronic symptom for her. Patient was last seen on 05/20/2020 for evaluation of weight loss, family history of ovarian cancer patient was referred to establish care with genetic counselor.  Patient eventually declined genetic testing.  12/30/2020, patient had a screening mammogram done which showed possible mass and asymmetry in the right breast. 01/08/2021, diagnostic mammogram of the right breast showed 2 adjacent irregular spiculated masses.  Larger 1 was 10 mm and a smaller mass measured 5 mm. 01/15/2021 stereotactic biopsy of the right breast smaller mass showed invasive carcinoma no special type, grade 2, DCIS present, LVI negative. Ultrasound guided biopsy of the larger mass showed invasive mammary carcinoma,   Patient has appointment with surgery Dr. Christian Barnes later this week.  She was referred to establish care with oncology for evaluation.  She was accompanied by daughter. She has no new complaints.  Family history of breast cancer: Denies Family history of other cancers: Patient has a strong family history of leukemia in her mother, colon cancer in her father at age of 55, ovarian cancer in her sister at age of 67, CLL in her brother  02/17/2021, patient is status post lumpectomy and sentinel lymph node biopsy Pathology showed invasive mammary carcinoma, 2 separate foci, DCIS, negative sentinel lymph node biopsy 0/1, grade 3, or margins negative for invasive carcinoma.  ER -, PR + [1-10%], HER2 +  pT1b(m) pN0   # Strong family history of cancer, Discussed again about genetic testing. patient adamantly declined.  #  Intermittent dizziness which per patient is chronic for her due to the vertigo. #03/23/2021 Mediport by vascular surgeon #History  of CHF, 03/08/2021, echocardiogram showed LVEF 60-65%. Per patient, she has been seen by Dr. [name] team and was cleared for proceeding with chemotherapy.   03/24/2021  - 03/31/2021 Taxol 80 mg/m2 [name] 04/05/2021 - 04/09/2021 patient was admitted due to neutropenic fever with associated SIRS.  Patient was treated with empiric cefepime and vancomycin until date of discharge when [place] recovered to 1.4.  Post hospitalization, patient also had COVID-19 infection so her follow-up appointment was further postponed for another 2 weeks.  05/10/2021 resumed dose reduce Taxol $RemoveBefo'65mg'OmeQjlZGrEe$ /m2 [name]  INTERVAL HISTORY Amber Barnes is a 70 y.o. female who has above history reviewed by me today presents for follow up visit for management of right side HER 2 positive breast cancer Patient tolerates dose reduced Taxol and trastuzumab chemotherapy well.  Denies any nausea vomiting diarrhea, fever or chills.  Patient has experienced bilateral knee pain, upper extremity aches and pains as well as numbness of fingertips and toes.  Patient has pre-existing neuropathy.  And Tylenol did not help her pain.  Review of Systems  Constitutional:  Positive for fatigue. Negative for appetite change, chills, fever and unexpected weight change.       Patient sits in the wheelchair  HENT:   Negative for hearing loss and voice change.   Eyes:  Negative for eye problems.  Respiratory:  Negative for chest tightness and cough.   Cardiovascular:  Negative for chest pain.  Gastrointestinal:  Negative for abdominal distention, abdominal pain, blood in stool, nausea and vomiting.  Endocrine: Negative for hot flashes.  Genitourinary:  Negative for difficulty urinating and frequency.   Musculoskeletal:  Negative for arthralgias.  Skin:  Negative for itching and rash.  Neurological:  Positive for numbness. Negative for dizziness and extremity weakness.  Hematological:  Negative for adenopathy.  Psychiatric/Behavioral:  Negative for confusion.    MEDICAL HISTORY:  Past Medical History:  Diagnosis Date   Achilles tendinitis    Acute medial meniscal tear    Allergy    Anxiety    Atrial  fibrillation ([place])    a. new onset 07/2016; b. CHADS2VASc --> 4 (HTN, female, age, CHF); c. eliquis   CHF (congestive heart failure) (HCC)    Chronic anticoagulation    Apixaban   Chronic lower back pain    Colon polyp    Complication of anesthesia    Emergence delirium; hypoxia   COPD (chronic obstructive pulmonary disease) ([place])    Cystic breast    Depression    Endometriosis    Fibrocystic breast disease    GERD (gastroesophageal reflux disease)    History of stress test    a. 07/2016 MV: EF 55-65%. Small, mild defect  in mid anteroseptal, apical septal, and apical locations. No ischemia-->low risk.   Hyperlipidemia    Hypertension    Insomnia    Lipoma    Malignant neoplasm of upper-outer quadrant of right female breast (HCC)    Stage 1A invasive mammary carcinoma (cT1b or T1c N0); ER (-), PR (+), HER2/neu (+)   Migraine    Mild Mitral regurgitation    a. 07/2016 Echo: EF 55-65%, no rwma, mild MR. Mildly dil LA. Nl RV fx.    OSA (obstructive sleep apnea)    non-compliant with nocturnal PAP therapy   Osteoarthritis    left knee   Osteopenia    Peptic ulcer disease    a. H. pylori   Pneumonia    RLS (restless legs syndrome)    Tendonitis    Thyroid disease  Vitamin D deficiency     SURGICAL HISTORY: Past Surgical History:  Procedure Laterality Date   ABDOMINAL HYSTERECTOMY     ovaries left   ACHILLES TENDON SURGERY     2012,01/2017   BACK SURGERY     fusion lumbar   BREAST BIOPSY Right 01/15/2021   Affirm biopsy-"X" clip-INVASIVE MAMMARY CARCINOMA   BREAST BIOPSY Right 01/15/2021   u/s bx-"venus" clip INVASIVE MAMMARY CARCINOMA   BREAST BIOPSY     BREAST LUMPECTOMY,RADIO FREQ LOCALIZER,AXILLARY SENTINEL LYMPH NODE BIOPSY Right 02/17/2021   Procedure: BREAST LUMPECTOMY,RADIO FREQ LOCALIZER,AXILLARY SENTINEL LYMPH NODE BIOPSY;  Surgeon: Ronny Bacon, MD;  Location: [facility];  Service: General;  Laterality: Right;   CARDIOVERSION N/A 09/07/2016   Procedure:  CARDIOVERSION;  Surgeon: Minna Merritts, MD;  Location: [facility];  Service: Cardiovascular;  Laterality: N/A;   CARDIOVERSION N/A 10/19/2016   Procedure: CARDIOVERSION;  Surgeon: Minna Merritts, MD;  Location: [facility];  Service: Cardiovascular;  Laterality: N/A;   CARDIOVERSION N/A 12/14/2016   Procedure: CARDIOVERSION;  Surgeon: Minna Merritts, MD;  Location: [facility];  Service: Cardiovascular;  Laterality: N/A;   COLONOSCOPY     COLONOSCOPY WITH PROPOFOL N/A 05/13/2020   Procedure: COLONOSCOPY WITH PROPOFOL;  Surgeon: Toledo, Benay Pike, MD;  Location: [facility];  Service: Gastroenterology;  Laterality: N/A;  ELIQUIS   ESOPHAGOGASTRODUODENOSCOPY     ESOPHAGOGASTRODUODENOSCOPY (EGD) WITH PROPOFOL N/A 05/13/2020   Procedure: ESOPHAGOGASTRODUODENOSCOPY (EGD) WITH PROPOFOL;  Surgeon: Toledo, Benay Pike, MD;  Location: [facility];  Service: Gastroenterology;  Laterality: N/A;   FOOT SURGERY Left    tendon   JOINT REPLACEMENT Right    hip   PORTA CATH INSERTION N/A 03/23/2021   Procedure: PORTA CATH INSERTION;  Surgeon: Katha Cabal, MD;  Location: [facility];  Service: Cardiovascular;  Laterality: N/A;   PORTACATH PLACEMENT N/A 03/23/2021   Procedure: ATTEMPTED INSERTION PORT-A-CATH;  Surgeon: Ronny Bacon, MD;  Location: [facility];  Service: General;  Laterality: N/A;   THYROIDECTOMY     thyroid lobectomy secondary to a benign nodule   TOTAL HIP ARTHROPLASTY Right 08/21/2018   Procedure: TOTAL HIP ARTHROPLASTY ANTERIOR APPROACH-RIGHT CELL SAVER REQUESTED;  Surgeon: [name], MD;  Location: [facility];  Service: Orthopedics;  Laterality: Right;    SOCIAL HISTORY: Social History   Socioeconomic History   Marital status: Married    Spouse name: jeffrey   Number of children: 1   Years of education: Not on file   Highest education level: High school graduate  Occupational History    Comment: disabled  Tobacco Use   Smoking status: Former    Packs/day:  0.25    Years: 10.00    Pack years: 2.50    Types: Cigarettes    Quit date: 2005    Years since quitting: 17.9   Smokeless tobacco: Never   Tobacco comments:    smoked for 10 years, 1 pack every 2-3 days  Vaping Use   Vaping Use: Never used  Substance and Sexual Activity   Alcohol use: Not Currently    Alcohol/week: 1.0 standard drink    Types: 1 Glasses of wine per week    Comment: rarely   Drug use: No   Sexual activity: Not Currently  Other Topics Concern   Not on file  Social History Narrative   Moved to [place] from [place], originally from [place].    1 cup of caffeine daily   Right handed  Social Determinants of Health   Financial Resource Strain: Low Risk    Difficulty of Paying Living Expenses: Not very hard  Food Insecurity: No Food Insecurity   Worried About Charity fundraiser in the Last Year: Never true   Ran Out of Food in the Last Year: Never true  Transportation Needs: No Transportation Needs   Lack of Transportation (Medical): No   Lack of Transportation (Non-Medical): No  Physical Activity: Unknown   Days of Exercise per Week: 0 days   Minutes of Exercise per Session: Not on file  Stress: Stress Concern Present   Feeling of Stress : Rather much  Social Connections: Moderately Isolated   Frequency of Communication with Friends and Family: Never   Frequency of Social Gatherings with Friends and Family: Once a week   Attends Religious Services: More than 4 times per year   Active Member of [employer] or Organizations: No   Attends Music therapist: Never   Marital Status: Married  Human resources officer Violence: Not At Risk   Fear of Current or Ex-Partner: No   Emotionally Abused: No   Physically Abused: No   Sexually Abused: No    FAMILY HISTORY: Family History  Problem Relation Age of Onset   Cancer Sister 30       ovarian   Depression Sister    Cancer Brother        CLL   Cancer Father 79       colon    Heart disease Father    Heart attack Father 62       died from MI   Hyperlipidemia Father    Hypertension Father    Cancer Mother        leukemia   Breast cancer Paternal Aunt        7's   Migraines Neg Hx     ALLERGIES:  is allergic to prednisone, amiodarone, hydrocodone-acetaminophen, oxycodone, tapentadol, tramadol, adhesive [tape], and latex.  MEDICATIONS:  Current Outpatient Medications  Medication Sig Dispense Refill   acetaminophen (TYLENOL) 500 MG tablet Take 500 mg by mouth every 6 (six) hours as needed. Taking as needed     albuterol (VENTOLIN HFA) 108 (90 Base) MCG/ACT inhaler INHALE 2 PUFFS INTO THE LUNGS EVERY 8 (EIGHT) HOURS AS NEEDED FOR WHEEZING OR SHORTNESS OF BREATH. 6.7 g 0   ALPRAZolam (XANAX) 0.25 MG tablet Take 0.25 mg by mouth at bedtime as needed for anxiety.     azelastine (ASTELIN) 0.1 % nasal spray Place into both nostrils as needed for rhinitis. Use in each nostril as directed     B Complex Vitamins (VITAMIN B COMPLEX PO) Take 1 tablet by mouth daily.      bisoprolol (ZEBETA) 5 MG tablet Take 1 tablet (5 mg total) by mouth 2 (two) times daily. 180 tablet 1   busPIRone (BUSPAR) 5 MG tablet TAKE 1 TABLET BY MOUTH THREE TIMES A DAY AS NEEDED 60 tablet 1   Cholecalciferol (VITAMIN D3) 5000 units CAPS Take 5,000 Units by mouth daily.     DULoxetine (CYMBALTA) 30 MG capsule Take 1 capsule (30 mg total) by mouth 2 (two) times daily. Home med.     ELIQUIS 5 MG TABS tablet TAKE 1 TABLET BY MOUTH TWICE A DAY 180 tablet 1   esomeprazole (NEXIUM) 20 MG capsule Take 20 mg by mouth as needed.     furosemide (LASIX) 20 MG tablet Take 1 tablet (20 mg total) by mouth 2 (two) times daily. [place]  tablet 0   lidocaine-prilocaine (EMLA) cream Apply to affected area once 30 g 3   loperamide (IMODIUM) 2 MG capsule Take 1 capsule (2 mg total) by mouth See admin instructions. Initial: 4 mg, followed by 2 mg after each loose stool; maximum: 16 mg/day 60 capsule 1   losartan (COZAAR)  25 MG tablet Take 1 tablet (25 mg total) by mouth daily. 90 tablet 3   Multiple Vitamin (MULTIVITAMIN) tablet Take 1 tablet by mouth daily.     nystatin (MYCOSTATIN/NYSTOP) powder Apply 1 application topically 3 (three) times daily. 15 g 1   ondansetron (ZOFRAN) 8 MG tablet Take 1 tablet (8 mg total) by mouth every 8 (eight) hours as needed for nausea or vomiting. 60 tablet 0   potassium chloride SA (KLOR-CON) 20 MEQ tablet Take 1 tablet (20 mEq total) by mouth 2 (two) times daily. 120 tablet 2   prochlorperazine (COMPAZINE) 10 MG tablet Take 1 tablet (10 mg total) by mouth every 6 (six) hours as needed (Nausea or vomiting). 30 tablet 1   promethazine (PHENERGAN) 25 MG tablet Take 1 tablet (25 mg total) by mouth every 6 (six) hours as needed for nausea or vomiting. 60 tablet 0   ranolazine (RANEXA) 500 MG 12 hr tablet TAKE 1 TABLET BY MOUTH TWICE A DAY 180 tablet 0   rizatriptan (MAXALT) 10 MG tablet TAKE 1 TABLET (10 MG TOTAL) BY MOUTH AS NEEDED FOR MIGRAINE. MAY REPEAT IN 2 HOURS IF NEEDED 10 tablet 0   rOPINIRole (REQUIP) 0.5 MG tablet Take 1 tablet (0.5 mg total) by mouth at bedtime. 90 tablet 0   rOPINIRole (REQUIP) 1 MG tablet TAKE 1 TABLET BY MOUTH AT BEDTIME. 90 tablet 1   rosuvastatin (CRESTOR) 20 MG tablet TAKE 1 TABLET BY MOUTH EVERY DAY 90 tablet 0   No current facility-administered medications for this visit.     PHYSICAL EXAMINATION: ECOG PERFORMANCE STATUS: 2 - Symptomatic, <50% confined to bed There were no vitals filed for this visit.  There were no vitals filed for this visit.   Physical Exam Constitutional:      General: She is not in acute distress.    Appearance: She is obese.  HENT:     Head: Normocephalic and atraumatic.  Eyes:     General: No scleral icterus. Cardiovascular:     Rate and Rhythm: Normal rate and regular rhythm.     Heart sounds: Normal heart sounds.  Pulmonary:     Effort: Pulmonary effort is normal. No respiratory distress.     Breath  sounds: No wheezing.  Abdominal:     General: Bowel sounds are normal. There is no distension.     Palpations: Abdomen is soft.  Musculoskeletal:        General: No deformity. Normal range of motion.     Cervical back: Normal range of motion and neck supple.  Skin:    General: Skin is warm and dry.     Findings: No erythema or rash.  Neurological:     Mental Status: She is alert and oriented to person, place, and time. Mental status is at baseline.     Cranial Nerves: No cranial nerve deficit.     Coordination: Coordination normal.  Psychiatric:        Mood and Affect: Mood normal.    . LABORATORY DATA:  I have reviewed the data as listed Lab Results  Component Value Date   WBC 5.7 06/09/2021   HGB 11.2 (L) 06/09/2021  HCT 34.1 (L) 06/09/2021   MCV 96.1 06/09/2021   PLT 212 06/09/2021   Recent Labs    05/18/21 0850 05/25/21 1317 06/09/21 0902  NA 139 136 140  K 3.9 3.9 3.9  CL 103 98 105  CO2 29 26 26   GLUCOSE 91 109* 123*  BUN 11 13 14   CREATININE 0.88 0.73 1.01*  CALCIUM 8.4* 8.7* 8.5*  GFRNONAA >60 >60 >60  PROT 6.6 7.1 6.4*  ALBUMIN 3.5 3.7 3.6  AST 17 16 18   ALT 16 16 14   ALKPHOS 67 70 71  BILITOT 0.8 0.8 0.6    Iron/TIBC/Ferritin/ %Sat    Component Value Date/Time   IRON 72 03/31/2021 0856   TIBC 364 03/31/2021 0856   FERRITIN 291 03/31/2021 0856   IRONPCTSAT 20 03/31/2021 0856      RADIOGRAPHIC STUDIES: I have personally reviewed the radiological images as listed and agreed with the findings in the report. No results found.    ASSESSMENT & PLAN:  1. HER2-positive carcinoma of breast ([place])   2. Encounter for antineoplastic chemotherapy   3. Neuropathy   4. Anemia due to antineoplastic chemotherapy    Cancer Staging  Malignant neoplasm of upper-outer quadrant of right female breast [facility]) Staging form: Breast, AJCC 8th Edition - Pathologic stage from 03/03/2021: Stage IA (pT1b, pN0, cM0, G3, ER-, PR+, HER2+) - Signed by Amber Server, MD on  03/03/2021  #Right breast invasive carcinoma pT1b pN0 ER negative, PR weakly HER2 positive positive, HER 2 positive.  Patient tolerates dose reduced Taxol and trastuzumab. Labs reviewed and discussed with patient To proceed with trastuzumab and dose reduced Taxol-65mg /m2 Decrease premed benadryl to 25mg  due to restless leg syndrome  #Chemotherapy-induced diarrhea, stable.  Imodium as needed. Patient has history of CHF, Repeat echocardiogram showed normal LVEF. Follow-up with cardiologist Dr. Rockey Barnes..  elevated creatinine could be secondary to diuretics.  Encourage ongoing hydration   #Pre-existing neuropathy in her fingertips bilaterally-Per patient this is secondary to cervical spine radiculopathy Patient is made aware that neuropathy may get worse secondary to chemotherapy  #Chemotherapy-induced anemia, hemoglobin has been stable.  Monitor. # she declined genetic testing #General body aches/arthritic pain.  Discussed about Tylenol 3 and the patient agrees with the plan willing to try.  Side effects were reviewed and discussed with patient.  Recommend Tylenol 3 every 6 hours as needed for pain.  Prescription sent to pharmacy.  Supportive care measures are necessary for patient well-being and will be provided as necessary. We spent sufficient time to discuss many aspect of care, questions were answered to patient's satisfaction.   All questions were answered. The patient knows to call the clinic with any problems questions or concerns.  cc [name], FNP  Follow-up  Taxol and trastuzumab.  Weekly x3   Amber Server, MD, PhD 06/16/2021

## 2021-06-16 NOTE — Patient Instructions (Signed)
[facility] [place]  Discharge Instructions: Thank you for choosing [facility] to provide your oncology and hematology care.  If you have a lab appointment with the [facility], please go directly to the [facility] and check in at the registration area.  Wear comfortable clothing and clothing appropriate for easy access to any Portacath or PICC line.   We strive to give you quality time with your provider. You may need to reschedule your appointment if you arrive late (15 or more minutes).  Arriving late affects you and other patients whose appointments are after yours.  Also, if you miss three or more appointments without notifying the office, you may be dismissed from the clinic at the provider's discretion.      For prescription refill requests, have your pharmacy contact our office and allow 72 hours for refills to be completed.    Today you received the following chemotherapy and/or immunotherapy agents Kanjinti and Taxol       To help prevent nausea and vomiting after your treatment, we encourage you to take your nausea medication as directed.  BELOW ARE SYMPTOMS THAT SHOULD BE REPORTED IMMEDIATELY: *FEVER GREATER THAN 100.4 F (38 C) OR HIGHER *CHILLS OR SWEATING *NAUSEA AND VOMITING THAT IS NOT CONTROLLED WITH YOUR NAUSEA MEDICATION *UNUSUAL SHORTNESS OF BREATH *UNUSUAL BRUISING OR BLEEDING *URINARY PROBLEMS (pain or burning when urinating, or frequent urination) *BOWEL PROBLEMS (unusual diarrhea, constipation, pain near the anus) TENDERNESS IN MOUTH AND THROAT WITH OR WITHOUT PRESENCE OF ULCERS (sore throat, sores in mouth, or a toothache) UNUSUAL RASH, SWELLING OR PAIN  UNUSUAL VAGINAL DISCHARGE OR ITCHING   Items with * indicate a potential emergency and should be followed up as soon as possible or go to the Emergency Department if any problems should occur.  Please show the CHEMOTHERAPY ALERT CARD or IMMUNOTHERAPY ALERT CARD at  check-in to the Emergency Department and triage nurse.  Should you have questions after your visit or need to cancel or reschedule your appointment, please contact [facility] [place] [facility] [place]  [phone] and follow the prompts.  Office hours are 8:00 a.m. to 4:30 p.m. Monday - Friday. Please note that voicemails left after 4:00 p.m. may not be returned until the following business day.  We are closed weekends and major holidays. You have access to a nurse at all times for urgent questions. Please call the main number to the clinic [phone] and follow the prompts.  For any non-urgent questions, you may also contact your provider using MyChart. We now offer e-Visits for anyone 24 and older to request care online for non-urgent symptoms. For details visit [email or web address].   Also download the MyChart app! Go to the app store, search "MyChart", open the app, select [facility], and log in with your MyChart username and password.  Due to Covid, a mask is required upon entering the hospital/clinic. If you do not have a mask, one will be given to you upon arrival. For doctor visits, patients may have 1 support person aged 34 or older with them. For treatment visits, patients cannot have anyone with them due to current Covid guidelines and our immunocompromised population.

## 2021-06-21 ENCOUNTER — Other Ambulatory Visit: Payer: Self-pay

## 2021-06-21 ENCOUNTER — Encounter: Payer: Self-pay | Admitting: Family

## 2021-06-21 ENCOUNTER — Ambulatory Visit (INDEPENDENT_AMBULATORY_CARE_PROVIDER_SITE_OTHER): Payer: Medicare Other | Admitting: Family

## 2021-06-21 VITALS — BP 118/82 | HR 62 | Temp 98.1°F | Ht 65.0 in

## 2021-06-21 DIAGNOSIS — I480 Paroxysmal atrial fibrillation: Secondary | ICD-10-CM

## 2021-06-21 DIAGNOSIS — I10 Essential (primary) hypertension: Secondary | ICD-10-CM

## 2021-06-21 DIAGNOSIS — N189 Chronic kidney disease, unspecified: Secondary | ICD-10-CM | POA: Diagnosis not present

## 2021-06-21 DIAGNOSIS — G2581 Restless legs syndrome: Secondary | ICD-10-CM | POA: Diagnosis not present

## 2021-06-21 MED ORDER — FUROSEMIDE 20 MG PO TABS: 20.0000 mg | ORAL_TABLET | Freq: Every day | ORAL | 0 refills | Status: DC

## 2021-06-21 MED ORDER — ROPINIROLE HCL 2 MG PO TABS: 2.0000 mg | ORAL_TABLET | Freq: Every day | ORAL | 1 refills | Status: DC

## 2021-06-21 NOTE — Progress Notes (Signed)
Subjective:    Patient ID: Amber Barnes, female    DOB: 1950-07-31, 70 y.o.   MRN: [id number]  CC: Amber Barnes is a 70 y.o. female who presents today for follow up.   HPI: Weight has increased 4 lbs. She is eating better.  She continues to have trouble with staying hydrated. Water doesn't taste good to her.   RLS-compliant with Requip 1 mg, 0.5 mg tablet to take nightly with relief at first with dose escalation. However now symptoms returned and affecting sleep.    Echocardiogram 02/2021 left EF 60-65%. Grade 1 diastolic dysfunction.  Mild left ventricular hypertrophy  she is no longer on losartan as of 3 weeks ago.  She is compliant with this bisoprolol 5 mg twice daily, eliquis 62m BID  for atrial fibrillation.  She is also taking Lasix 20 mg twice daily and potassium chloride 20 meq bid. .No  leg swelling, sob, orthopnea.   Reduction kidney function. No nsaids. Endorses urinary frequency. No dysuria.   History of right breast cancer.  Following with Dr. YTasia Catchingsfor chemotherapy Crt 1.04 on 06/16/21  HISTORY:  Past Medical History:  Diagnosis Date  . Achilles tendinitis   . Acute medial meniscal tear   . Allergy   . Anxiety   . Atrial fibrillation ([place]    a. new onset 07/2016; b. CHADS2VASc --> 4 (HTN, female, age, CHF); c. eliquis  . CHF (congestive heart failure) ([place]   . Chronic anticoagulation    Apixaban  . Chronic lower back pain   . Colon polyp   . Complication of anesthesia    Emergence delirium; hypoxia  . COPD (chronic obstructive pulmonary disease) ([place]   . Cystic breast   . Depression   . Endometriosis   . Fibrocystic breast disease   . GERD (gastroesophageal reflux disease)   . History of stress test    a. 07/2016 MV: EF 55-65%. Small, mild defect  in mid anteroseptal, apical septal, and apical locations. No ischemia-->low risk.  .Marland KitchenHyperlipidemia   . Hypertension   . Insomnia   . Lipoma   . Malignant neoplasm of upper-outer quadrant of right female  breast ([place]    Stage 1A invasive mammary carcinoma (cT1b or T1c N0); ER (-), PR (+), HER2/neu (+)  . Migraine   . Mild Mitral regurgitation    a. 07/2016 Echo: EF 55-65%, no rwma, mild MR. Mildly dil LA. Nl RV fx.   . OSA (obstructive sleep apnea)    non-compliant with nocturnal PAP therapy  . Osteoarthritis    left knee  . Osteopenia   . Peptic ulcer disease    a. H. pylori  . Pneumonia   . RLS (restless legs syndrome)   . Tendonitis   . Thyroid disease   . Vitamin D deficiency    Past Surgical History:  Procedure Laterality Date  . ABDOMINAL HYSTERECTOMY     ovaries left  . ACHILLES TENDON SURGERY     2012,01/2017  . BACK SURGERY     fusion lumbar  . BREAST BIOPSY Right 01/15/2021   Affirm biopsy-"X" clip-INVASIVE MAMMARY CARCINOMA  . BREAST BIOPSY Right 01/15/2021   u/s bx-"venus" clip INVASIVE MAMMARY CARCINOMA  . BREAST BIOPSY    . BREAST LUMPECTOMY,RADIO FREQ LOCALIZER,AXILLARY SENTINEL LYMPH NODE BIOPSY Right 02/17/2021   Procedure: BREAST LUMPECTOMY,RADIO FREQ LOCALIZER,AXILLARY SENTINEL LYMPH NODE BIOPSY;  Surgeon: RRonny Bacon MD;  Location: [facility];  Service: General;  Laterality: Right;  . CARDIOVERSION N/A 09/07/2016   Procedure: CARDIOVERSION;  Surgeon: Minna Merritts, MD;  Location: [facility];  Service: Cardiovascular;  Laterality: N/A;  . CARDIOVERSION N/A 10/19/2016   Procedure: CARDIOVERSION;  Surgeon: Minna Merritts, MD;  Location: [facility];  Service: Cardiovascular;  Laterality: N/A;  . CARDIOVERSION N/A 12/14/2016   Procedure: CARDIOVERSION;  Surgeon: Minna Merritts, MD;  Location: [facility];  Service: Cardiovascular;  Laterality: N/A;  . COLONOSCOPY    . COLONOSCOPY WITH PROPOFOL N/A 05/13/2020   Procedure: COLONOSCOPY WITH PROPOFOL;  Surgeon: Toledo, Benay Pike, MD;  Location: [facility];  Service: Gastroenterology;  Laterality: N/A;  ELIQUIS  . ESOPHAGOGASTRODUODENOSCOPY    . ESOPHAGOGASTRODUODENOSCOPY (EGD) WITH PROPOFOL N/A 05/13/2020    Procedure: ESOPHAGOGASTRODUODENOSCOPY (EGD) WITH PROPOFOL;  Surgeon: Toledo, Benay Pike, MD;  Location: [facility];  Service: Gastroenterology;  Laterality: N/A;  . FOOT SURGERY Left    tendon  . JOINT REPLACEMENT Right    hip  . PORTA CATH INSERTION N/A 03/23/2021   Procedure: PORTA CATH INSERTION;  Surgeon: Katha Cabal, MD;  Location: [facility];  Service: Cardiovascular;  Laterality: N/A;  . PORTACATH PLACEMENT N/A 03/23/2021   Procedure: ATTEMPTED INSERTION PORT-A-CATH;  Surgeon: Ronny Bacon, MD;  Location: [facility];  Service: General;  Laterality: N/A;  . THYROIDECTOMY     thyroid lobectomy secondary to a benign nodule  . TOTAL HIP ARTHROPLASTY Right 08/21/2018   Procedure: TOTAL HIP ARTHROPLASTY ANTERIOR APPROACH-RIGHT CELL SAVER REQUESTED;  Surgeon: [name], MD;  Location: [facility];  Service: Orthopedics;  Laterality: Right;   Family History  Problem Relation Age of Onset  . Cancer Sister 76       ovarian  . Depression Sister   . Cancer Brother        CLL  . Cancer Father 68       colon  . Heart disease Father   . Heart attack Father 59       died from MI  . Hyperlipidemia Father   . Hypertension Father   . Cancer Mother        leukemia  . Breast cancer Paternal Aunt        61's  . Migraines Neg Hx     Allergies: Prednisone, Amiodarone, Hydrocodone-acetaminophen, Oxycodone, Tapentadol, Tramadol, Adhesive [tape], and Latex Current Outpatient Medications on File Prior to Visit  Medication Sig Dispense Refill  . acetaminophen (TYLENOL) 500 MG tablet Take 500 mg by mouth every 6 (six) hours as needed. Taking as needed    . acetaminophen-codeine (TYLENOL #3) 300-30 MG tablet Take 1 tablet by mouth every 6 (six) hours as needed for moderate pain or severe pain. 60 tablet 0  . albuterol (VENTOLIN HFA) 108 (90 Base) MCG/ACT inhaler INHALE 2 PUFFS INTO THE LUNGS EVERY 8 (EIGHT) HOURS AS NEEDED FOR WHEEZING OR SHORTNESS OF BREATH. 6.7 g 0  .  ALPRAZolam (XANAX) 0.25 MG tablet Take 0.25 mg by mouth at bedtime as needed for anxiety.    Marland Kitchen azelastine (ASTELIN) 0.1 % nasal spray Place into both nostrils as needed for rhinitis. Use in each nostril as directed    . B Complex Vitamins (VITAMIN B COMPLEX PO) Take 1 tablet by mouth daily.     . busPIRone (BUSPAR) 5 MG tablet TAKE 1 TABLET BY MOUTH THREE TIMES A DAY AS NEEDED 60 tablet 1  . Cholecalciferol (VITAMIN D3) 5000 units CAPS Take 5,000 Units by mouth daily.    . DULoxetine (CYMBALTA) 30 MG capsule Take 1 capsule (30 mg total) by mouth 2 (  two) times daily. Home med.    Marland Kitchen ELIQUIS 5 MG TABS tablet TAKE 1 TABLET BY MOUTH TWICE A DAY 180 tablet 1  . esomeprazole (NEXIUM) 20 MG capsule Take 20 mg by mouth as needed.    . furosemide (LASIX) 20 MG tablet Take 1 tablet (20 mg total) by mouth 2 (two) times daily. 180 tablet 0  . lidocaine-prilocaine (EMLA) cream Apply to affected area once 30 g 3  . loperamide (IMODIUM) 2 MG capsule Take 1 capsule (2 mg total) by mouth See admin instructions. Initial: 4 mg, followed by 2 mg after each loose stool; maximum: 16 mg/day 60 capsule 1  . Multiple Vitamin (MULTIVITAMIN) tablet Take 1 tablet by mouth daily.    Marland Kitchen nystatin (MYCOSTATIN/NYSTOP) powder Apply 1 application topically 3 (three) times daily. 15 g 1  . ondansetron (ZOFRAN) 8 MG tablet Take 1 tablet (8 mg total) by mouth every 8 (eight) hours as needed for nausea or vomiting. 60 tablet 0  . potassium chloride SA (KLOR-CON) 20 MEQ tablet Take 1 tablet (20 mEq total) by mouth 2 (two) times daily. 120 tablet 2  . prochlorperazine (COMPAZINE) 10 MG tablet Take 1 tablet (10 mg total) by mouth every 6 (six) hours as needed (Nausea or vomiting). 30 tablet 1  . promethazine (PHENERGAN) 25 MG tablet Take 1 tablet (25 mg total) by mouth every 6 (six) hours as needed for nausea or vomiting. 60 tablet 0  . ranolazine (RANEXA) 500 MG 12 hr tablet TAKE 1 TABLET BY MOUTH TWICE A DAY 180 tablet 0  . rizatriptan  (MAXALT) 10 MG tablet TAKE 1 TABLET (10 MG TOTAL) BY MOUTH AS NEEDED FOR MIGRAINE. MAY REPEAT IN 2 HOURS IF NEEDED 10 tablet 0  . rosuvastatin (CRESTOR) 20 MG tablet TAKE 1 TABLET BY MOUTH EVERY DAY 90 tablet 0  . bisoprolol (ZEBETA) 5 MG tablet Take 1 tablet (5 mg total) by mouth 2 (two) times daily. 180 tablet 1   No current facility-administered medications on file prior to visit.    Social History   Tobacco Use  . Smoking status: Former    Packs/day: 0.25    Years: 10.00    Pack years: 2.50    Types: Cigarettes    Quit date: 2005    Years since quitting: 17.9  . Smokeless tobacco: Never  . Tobacco comments:    smoked for 10 years, 1 pack every 2-3 days  Vaping Use  . Vaping Use: Never used  Substance Use Topics  . Alcohol use: Not Currently    Alcohol/week: 1.0 standard drink    Types: 1 Glasses of wine per week    Comment: rarely  . Drug use: No    Review of Systems  Constitutional:  Negative for chills and fever.  Respiratory:  Negative for cough and shortness of breath.   Cardiovascular:  Negative for chest pain, palpitations and leg swelling.  Gastrointestinal:  Negative for nausea and vomiting.  Genitourinary:  Positive for frequency. Negative for dysuria.     Objective:    BP 118/82 (BP Location: Left Arm, Patient Position: Sitting, Cuff Size: Large)   Pulse 62   Temp 98.1 F (36.7 C) (Oral)   Ht 5' 5" (1.651 m)   SpO2 97%   BMI 46.59 kg/m  BP Readings from Last 3 Encounters:  06/21/21 118/82  06/16/21 (!) 119/92  06/16/21 (!) 121/92   Wt Readings from Last 3 Encounters:  06/16/21 280 lb (127 kg)  06/09/21 276 lb  14.4 oz (125.6 kg)  05/25/21 273 lb (123.8 kg)    Physical Exam Vitals reviewed.  Constitutional:      Appearance: She is well-developed.  Eyes:     Conjunctiva/sclera: Conjunctivae normal.  Cardiovascular:     Rate and Rhythm: Normal rate and regular rhythm.     Pulses: Normal pulses.     Heart sounds: Normal heart sounds.   Pulmonary:     Effort: Pulmonary effort is normal.     Breath sounds: Normal breath sounds. No wheezing, rhonchi or rales.  Musculoskeletal:     Right lower leg: No edema.     Left lower leg: No edema.  Skin:    General: Skin is warm and dry.  Neurological:     Mental Status: She is alert.  Psychiatric:        Speech: Speech normal.        Behavior: Behavior normal.        Thought Content: Thought content normal.       Assessment & Plan:   Problem List Items Addressed This Visit       Cardiovascular and Mediastinum   Atrial fibrillation (HCC)    Chronic stable.Continue  bisoprolol 5 mg twice daily, eliquis 47m BID      Hypertension    Excellent control. She is no longer on losartan. No evidence of fluid volume overload.echo 02/2021 showed Grade 1 diastolic dysfunction  In setting of increased creatinine and bothersome urinary frequency, trial of lasix 124mBID from 2065mid and decrease potassium chloride to 20 meq QD as on lower dose of lasix.          Genitourinary   CKD (chronic kidney disease)    Slight decrease.  She is avoiding NSAIDs. she will have repeat BMP with hematology in 2 days.  Will follow.  Encouraged hydration, temporarily decrease Lasix from 20 mg twice daily to 10 mg twice daily        Other   Restless leg syndrome - Primary    Uncontrolled. Increase requip to 2mg76ms.       Relevant Medications   rOPINIRole (REQUIP) 2 MG tablet     I have discontinued JacqDocia Furlckie"'s rOPINIRole, losartan, and rOPINIRole. I am also having her start on rOPINIRole. Additionally, I am having her maintain her multivitamin, B Complex Vitamins (VITAMIN B COMPLEX PO), Vitamin D3, rizatriptan, albuterol, acetaminophen, Eliquis, esomeprazole, azelastine, potassium chloride SA, lidocaine-prilocaine, prochlorperazine, ranolazine, rosuvastatin, furosemide, ondansetron, promethazine, bisoprolol, DULoxetine, busPIRone, nystatin, loperamide, ALPRAZolam, and  acetaminophen-codeine.   Meds ordered this encounter  Medications  . rOPINIRole (REQUIP) 2 MG tablet    Sig: Take 1 tablet (2 mg total) by mouth at bedtime.    Dispense:  90 tablet    Refill:  1    Order Specific Question:   Supervising Provider    Answer:   TULLCrecencio [id number]]    Return precautions given.   Risks, benefits, and alternatives of the medications and treatment plan prescribed today were discussed, and patient expressed understanding.   Education regarding symptom management and diagnosis given to patient on AVS.  Continue to follow with ArneBurnard HawthorneP for routine health maintenance.   JacqDocia Furl I agreed with plan.   MargMable ParisP

## 2021-06-21 NOTE — Assessment & Plan Note (Signed)
Uncontrolled. Increase requip to 2mg  qhs.

## 2021-06-21 NOTE — Assessment & Plan Note (Signed)
Chronic stable.Continue  bisoprolol 5 mg twice daily, eliquis 5mg  BID

## 2021-06-21 NOTE — Assessment & Plan Note (Signed)
Slight decrease.  She is avoiding NSAIDs. she will have repeat BMP with hematology in 2 days.  Will follow.  Encouraged hydration, temporarily decrease Lasix from 20 mg twice daily to 10 mg twice daily

## 2021-06-21 NOTE — Assessment & Plan Note (Addendum)
Excellent control. She is no longer on losartan. No evidence of fluid volume overload.echo 02/2021 showed Grade 1 diastolic dysfunction  In setting of increased creatinine and bothersome urinary frequency, trial of lasix 10mg  BID from 20mg  bid and decrease potassium chloride to 20 meq QD as on lower dose of lasix.

## 2021-06-21 NOTE — Patient Instructions (Addendum)
For now, lets TRIAL decrease lasix to 10mg  twice per day and drink more water .  Repeat labs with Dr Tasia Catchings and send me a note to let me know that they are done.  Increase Requip to 2 mg at bedtime  Let me know how you are doing

## 2021-06-21 NOTE — Progress Notes (Signed)
I called patient & instructed on below. She will decrease potassium to 20 meq once daily.

## 2021-06-23 ENCOUNTER — Encounter: Payer: Self-pay | Admitting: Family

## 2021-06-23 ENCOUNTER — Inpatient Hospital Stay (HOSPITAL_BASED_OUTPATIENT_CLINIC_OR_DEPARTMENT_OTHER): Payer: Medicare Other | Admitting: Oncology

## 2021-06-23 ENCOUNTER — Inpatient Hospital Stay: Payer: Medicare Other

## 2021-06-23 ENCOUNTER — Other Ambulatory Visit: Payer: Self-pay

## 2021-06-23 ENCOUNTER — Encounter: Payer: Self-pay | Admitting: Oncology

## 2021-06-23 VITALS — BP 105/68 | HR 97 | Temp 96.2°F | Wt 267.0 lb

## 2021-06-23 DIAGNOSIS — G629 Polyneuropathy, unspecified: Secondary | ICD-10-CM

## 2021-06-23 DIAGNOSIS — K219 Gastro-esophageal reflux disease without esophagitis: Secondary | ICD-10-CM | POA: Diagnosis not present

## 2021-06-23 DIAGNOSIS — I4891 Unspecified atrial fibrillation: Secondary | ICD-10-CM | POA: Diagnosis not present

## 2021-06-23 DIAGNOSIS — Z171 Estrogen receptor negative status [ER-]: Secondary | ICD-10-CM | POA: Diagnosis not present

## 2021-06-23 DIAGNOSIS — C50919 Malignant neoplasm of unspecified site of unspecified female breast: Secondary | ICD-10-CM | POA: Diagnosis not present

## 2021-06-23 DIAGNOSIS — D702 Other drug-induced agranulocytosis: Secondary | ICD-10-CM | POA: Diagnosis not present

## 2021-06-23 DIAGNOSIS — C50411 Malignant neoplasm of upper-outer quadrant of right female breast: Secondary | ICD-10-CM | POA: Diagnosis not present

## 2021-06-23 DIAGNOSIS — D6481 Anemia due to antineoplastic chemotherapy: Secondary | ICD-10-CM

## 2021-06-23 DIAGNOSIS — E785 Hyperlipidemia, unspecified: Secondary | ICD-10-CM | POA: Diagnosis not present

## 2021-06-23 DIAGNOSIS — Z806 Family history of leukemia: Secondary | ICD-10-CM | POA: Diagnosis not present

## 2021-06-23 DIAGNOSIS — I509 Heart failure, unspecified: Secondary | ICD-10-CM | POA: Diagnosis not present

## 2021-06-23 DIAGNOSIS — D709 Neutropenia, unspecified: Secondary | ICD-10-CM | POA: Diagnosis not present

## 2021-06-23 DIAGNOSIS — R197 Diarrhea, unspecified: Secondary | ICD-10-CM | POA: Diagnosis not present

## 2021-06-23 DIAGNOSIS — T451X5A Adverse effect of antineoplastic and immunosuppressive drugs, initial encounter: Secondary | ICD-10-CM

## 2021-06-23 DIAGNOSIS — K521 Toxic gastroenteritis and colitis: Secondary | ICD-10-CM

## 2021-06-23 DIAGNOSIS — Z5111 Encounter for antineoplastic chemotherapy: Secondary | ICD-10-CM

## 2021-06-23 DIAGNOSIS — M8580 Other specified disorders of bone density and structure, unspecified site: Secondary | ICD-10-CM | POA: Diagnosis not present

## 2021-06-23 DIAGNOSIS — J449 Chronic obstructive pulmonary disease, unspecified: Secondary | ICD-10-CM | POA: Diagnosis not present

## 2021-06-23 DIAGNOSIS — Z86010 Personal history of colonic polyps: Secondary | ICD-10-CM | POA: Diagnosis not present

## 2021-06-23 LAB — COMPREHENSIVE METABOLIC PANEL
ALT: 17 U/L (ref 0–44)
AST: 19 U/L (ref 15–41)
Albumin: 3.8 g/dL (ref 3.5–5.0)
Alkaline Phosphatase: 64 U/L (ref 38–126)
Anion gap: 10 (ref 5–15)
BUN: 14 mg/dL (ref 8–23)
CO2: 24 mmol/L (ref 22–32)
Calcium: 8.6 mg/dL — ABNORMAL LOW (ref 8.9–10.3)
Chloride: 103 mmol/L (ref 98–111)
Creatinine, Ser: 1.08 mg/dL — ABNORMAL HIGH (ref 0.44–1.00)
GFR, Estimated: 56 mL/min — ABNORMAL LOW (ref >60)
Glucose, Bld: 127 mg/dL — ABNORMAL HIGH (ref 70–99)
Potassium: 3.4 mmol/L — ABNORMAL LOW (ref 3.5–5.1)
Sodium: 137 mmol/L (ref 135–145)
Total Bilirubin: 1 mg/dL (ref 0.3–1.2)
Total Protein: 6.8 g/dL (ref 6.5–8.1)

## 2021-06-23 LAB — CBC WITH DIFFERENTIAL/PLATELET
Abs Immature Granulocytes: 0.02 10*3/uL (ref 0.00–0.07)
Basophils Absolute: 0 10*3/uL (ref 0.0–0.1)
Basophils Relative: 2 %
Eosinophils Absolute: 0.1 10*3/uL (ref 0.0–0.5)
Eosinophils Relative: 3 %
HCT: 36.6 % (ref 36.0–46.0)
Hemoglobin: 12.3 g/dL (ref 12.0–15.0)
Immature Granulocytes: 1 %
Lymphocytes Relative: 67 %
Lymphs Abs: 1.6 10*3/uL (ref 0.7–4.0)
MCH: 31.1 pg (ref 26.0–34.0)
MCHC: 33.6 g/dL (ref 30.0–36.0)
MCV: 92.7 fL (ref 80.0–100.0)
Monocytes Absolute: 0.1 10*3/uL (ref 0.1–1.0)
Monocytes Relative: 4 %
Neutro Abs: 0.6 10*3/uL — ABNORMAL LOW (ref 1.7–7.7)
Neutrophils Relative %: 23 %
Platelets: 242 10*3/uL (ref 150–400)
RBC: 3.95 MIL/uL (ref 3.87–5.11)
RDW: 13.2 % (ref 11.5–15.5)
WBC: 2.4 10*3/uL — ABNORMAL LOW (ref 4.0–10.5)
nRBC: 0 % (ref 0.0–0.2)

## 2021-06-23 MED ORDER — MAGIC MOUTHWASH: 5.0000 mL | Freq: Two times a day (BID) | ORAL | 1 refills | Status: DC

## 2021-06-23 MED ORDER — FILGRASTIM-SNDZ 480 MCG/0.8ML IJ SOSY
480.0000 ug | PREFILLED_SYRINGE | Freq: Once | INTRAMUSCULAR | Status: AC
  Administered 2021-06-23: 11:00:00 480 ug via SUBCUTANEOUS
  Filled 2021-06-23: qty 0.8

## 2021-06-23 MED ORDER — HEPARIN SOD (PORK) LOCK FLUSH 100 UNIT/ML IV SOLN
INTRAVENOUS | Status: AC
  Administered 2021-06-23: 10:00:00 500 [IU]
  Filled 2021-06-23: qty 5

## 2021-06-23 NOTE — Progress Notes (Signed)
Hematology/Oncology Progress note [phone] [phone]   Patient Care Team: [name], FNP as PCP - General (Family Medicine) [name] [name], MD as PCP - Cardiology (Cardiology) Charletta Cousin, MD (Inactive) [name], [name], MD as Consulting Physician (Cardiology) Rico Junker, RN as Oncology Nurse Navigator [name], MD as Consulting Physician (Hematology and Oncology)  REFERRING PROVIDER: [name], FNP  CHIEF COMPLAINTS/REASON FOR VISIT:  Follow-up for stage I HER2 positive breast cancer  HISTORY OF PRESENTING ILLNESS:   Amber Barnes is a  70 y.o.  female with PMH listed below was seen in consultation at the request of  [name], FNP  for evaluation of weight loss, family history ovarian cancer. Patient reports a history of nausea vomiting, early satiety, dysphagia symptoms that started in March.  She has lost significant amount of weight as well. Patient weighed 340 pounds in February 2021, 309 pounds in May 2021 and currently she weighs 296 pounds today. Patient has tried Zofran and scopolamine patch which did not relieve her symptoms. 11/05/2019, CT abdomen pelvis showed chronic changes with out acute abnormality to correspond with the patient's symptoms. Patient has had right upper quadrant ultrasound done on 11/14/2019, which showed hepatic steatosis with no other acute abnormality. Patient was seen by gastroenterology Dr. [name] on 11/18/2019.  Patient was recommended to take PPI daily.  Added Carafate.  H. pylori testing came back positive and the patient was started on a course of tetracycline, metronidazole, Pepto-Bismol for H. pylori treatments.  Patient reports that she has been on the treatment for 7 days now.  Nausea is slightly better but still persistent.  She is following up with gastroenterology next week for discussion of EGD. Currently she is not able to tolerate colonoscopy prep due to the nausea.  Her stool  was tested positive for occult blood. She also reports night sweating which is a chronic symptom for her. Patient was last seen on 05/20/2020 for evaluation of weight loss, family history of ovarian cancer patient was referred to establish care with genetic counselor.  Patient eventually declined genetic testing.  12/30/2020, patient had a screening mammogram done which showed possible mass and asymmetry in the right breast. 01/08/2021, diagnostic mammogram of the right breast showed 2 adjacent irregular spiculated masses.  Larger 1 was 10 mm and a smaller mass measured 5 mm. 01/15/2021 stereotactic biopsy of the right breast smaller mass showed invasive carcinoma no special type, grade 2, DCIS present, LVI negative. Ultrasound guided biopsy of the larger mass showed invasive mammary carcinoma,   Patient has appointment with surgery Dr. [name] later this week.  She was referred to establish care with oncology for evaluation.  She was accompanied by daughter. She has no new complaints.  Family history of breast cancer: Denies Family history of other cancers: Patient has a strong family history of leukemia in her mother, colon cancer in her father at age of 53, ovarian cancer in her sister at age of 60, CLL in her brother  02/17/2021, patient is status post lumpectomy and sentinel lymph node biopsy Pathology showed invasive mammary carcinoma, 2 separate foci, DCIS, negative sentinel lymph node biopsy 0/1, grade 3, or margins negative for invasive carcinoma.  ER -, PR + [1-10%], HER2 +  pT1b(m) pN0   # Strong family history of cancer, Discussed again about genetic testing. patient adamantly declined.  #  Intermittent dizziness which per patient is chronic for her due to the vertigo. #03/23/2021 Mediport by vascular surgeon #  of CHF, °03/08/2021, echocardiogram showed LVEF 60-65%. °Per patient, she has been seen by Dr. Gollan's team and was cleared for proceeding with chemotherapy.  ° °03/24/2021  - 03/31/2021 Taxol 80 mg/m2 /trastuzumab °04/05/2021 - 04/09/2021 patient was admitted due to neutropenic fever with associated SIRS.  Patient was treated with empiric cefepime and vancomycin until date of discharge when ANC recovered to 1.4. ° °Post hospitalization, patient also had COVID-19 infection so her follow-up appointment was further postponed for another 2 weeks. ° °05/10/2021 resumed dose reduce Taxol 65mg/m2 /trastuzumab ° °INTERVAL HISTORY °Amber Barnes is a 70 y.o. female who has above history reviewed by me today presents for follow up visit for management of right side HER 2 positive breast cancer °Denies any nausea vomiting diarrhea, fever or chills.  °Ongoing neuropathy symptoms, pre-existing °No fever, chills, cough, dysuria.   ° °Review of Systems  °Constitutional:  Positive for fatigue. Negative for appetite change, chills, fever and unexpected weight change.  °     Patient sits in the wheelchair  °HENT:   Negative for hearing loss and voice change.   °Eyes:  Negative for eye problems.  °Respiratory:  Negative for chest tightness and cough.   °Cardiovascular:  Negative for chest pain.  °Gastrointestinal:  Negative for abdominal distention, abdominal pain, blood in stool, nausea and vomiting.  °Endocrine: Negative for hot flashes.  °Genitourinary:  Negative for difficulty urinating and frequency.   °Musculoskeletal:  Negative for arthralgias.  °Skin:  Negative for itching and rash.  °Neurological:  Positive for numbness. Negative for dizziness and extremity weakness.  °Hematological:  Negative for adenopathy.  °Psychiatric/Behavioral:  Negative for confusion.   ° °MEDICAL HISTORY:  °Past Medical History:  °Diagnosis Date  ° Achilles tendinitis   ° Acute medial meniscal tear   ° Allergy   ° Anxiety   ° Atrial fibrillation (HCC)   ° a. new onset 07/2016; b. CHADS2VASc --> 4 (HTN, female, age, CHF); c. eliquis  ° CHF (congestive heart failure) (HCC)   ° Chronic anticoagulation   ° Apixaban  °  Chronic lower back pain   ° Colon polyp   ° Complication of anesthesia   ° Emergence delirium; hypoxia  ° COPD (chronic obstructive pulmonary disease) (HCC)   ° Cystic breast   ° Depression   ° Endometriosis   ° Fibrocystic breast disease   ° GERD (gastroesophageal reflux disease)   ° History of stress test   ° a. 07/2016 MV: EF 55-65%. Small, mild defect  in mid anteroseptal, apical septal, and apical locations. No ischemia-->low risk.  ° Hyperlipidemia   ° Hypertension   ° Insomnia   ° Lipoma   ° Malignant neoplasm of upper-outer quadrant of right female breast (HCC)   ° Stage 1A invasive mammary carcinoma (cT1b or T1c N0); ER (-), PR (+), HER2/neu (+)  ° Migraine   ° Mild Mitral regurgitation   ° a. 07/2016 Echo: EF 55-65%, no rwma, mild MR. Mildly dil LA. Nl RV fx.   ° OSA (obstructive sleep apnea)   ° non-compliant with nocturnal PAP therapy  ° Osteoarthritis   ° left knee  ° Osteopenia   ° Peptic ulcer disease   ° a. H. pylori  ° Pneumonia   ° RLS (restless legs syndrome)   ° Tendonitis   ° Thyroid disease   ° Vitamin D deficiency   ° ° °SURGICAL HISTORY: °Past Surgical History:  °Procedure Laterality Date  ° ABDOMINAL HYSTERECTOMY    ° ovaries left  °   ACHILLES TENDON SURGERY     2012,01/2017   BACK SURGERY     fusion lumbar   BREAST BIOPSY Right 01/15/2021   Affirm biopsy-"X" clip-INVASIVE MAMMARY CARCINOMA   BREAST BIOPSY Right 01/15/2021   u/s bx-"venus" clip INVASIVE MAMMARY CARCINOMA   BREAST BIOPSY     BREAST LUMPECTOMY,RADIO FREQ LOCALIZER,AXILLARY SENTINEL LYMPH NODE BIOPSY Right 02/17/2021   Procedure: BREAST LUMPECTOMY,RADIO FREQ LOCALIZER,AXILLARY SENTINEL LYMPH NODE BIOPSY;  Surgeon: Ronny Bacon, MD;  Location: [facility];  Service: General;  Laterality: Right;   CARDIOVERSION N/A 09/07/2016   Procedure: CARDIOVERSION;  Surgeon: Minna Merritts, MD;  Location: [facility];  Service: Cardiovascular;  Laterality: N/A;   CARDIOVERSION N/A 10/19/2016   Procedure: CARDIOVERSION;  Surgeon:  Minna Merritts, MD;  Location: [facility];  Service: Cardiovascular;  Laterality: N/A;   CARDIOVERSION N/A 12/14/2016   Procedure: CARDIOVERSION;  Surgeon: Minna Merritts, MD;  Location: [facility];  Service: Cardiovascular;  Laterality: N/A;   COLONOSCOPY     COLONOSCOPY WITH PROPOFOL N/A 05/13/2020   Procedure: COLONOSCOPY WITH PROPOFOL;  Surgeon: Toledo, Benay Pike, MD;  Location: [facility];  Service: Gastroenterology;  Laterality: N/A;  ELIQUIS   ESOPHAGOGASTRODUODENOSCOPY     ESOPHAGOGASTRODUODENOSCOPY (EGD) WITH PROPOFOL N/A 05/13/2020   Procedure: ESOPHAGOGASTRODUODENOSCOPY (EGD) WITH PROPOFOL;  Surgeon: Toledo, Benay Pike, MD;  Location: [facility];  Service: Gastroenterology;  Laterality: N/A;   FOOT SURGERY Left    tendon   JOINT REPLACEMENT Right    hip   PORTA CATH INSERTION N/A 03/23/2021   Procedure: PORTA CATH INSERTION;  Surgeon: Katha Cabal, MD;  Location: [facility];  Service: Cardiovascular;  Laterality: N/A;   PORTACATH PLACEMENT N/A 03/23/2021   Procedure: ATTEMPTED INSERTION PORT-A-CATH;  Surgeon: Ronny Bacon, MD;  Location: [facility];  Service: General;  Laterality: N/A;   THYROIDECTOMY     thyroid lobectomy secondary to a benign nodule   TOTAL HIP ARTHROPLASTY Right 08/21/2018   Procedure: TOTAL HIP ARTHROPLASTY ANTERIOR APPROACH-RIGHT CELL SAVER REQUESTED;  Surgeon: [name], MD;  Location: [facility];  Service: Orthopedics;  Laterality: Right;    SOCIAL HISTORY: Social History   Socioeconomic History   Marital status: Married    Spouse name: jeffrey   Number of children: 1   Years of education: Not on file   Highest education level: High school graduate  Occupational History    Comment: disabled  Tobacco Use   Smoking status: Former    Packs/day: 0.25    Years: 10.00    Pack years: 2.50    Types: Cigarettes    Quit date: 2005    Years since quitting: 17.9   Smokeless tobacco: Never   Tobacco comments:    smoked for 10  years, 1 pack every 2-3 days  Vaping Use   Vaping Use: Never used  Substance and Sexual Activity   Alcohol use: Not Currently    Alcohol/week: 1.0 standard drink    Types: 1 Glasses of wine per week    Comment: rarely   Drug use: No   Sexual activity: Not Currently  Other Topics Concern   Not on file  Social History Narrative   Moved to [place] from [place], originally from [place].    1 cup of caffeine daily   Right handed          Social Determinants of Health   Financial Resource Strain: Low Risk    Difficulty of Paying Living Expenses: Not very hard  Food  Insecurity: No Food Insecurity  ° Worried About Running Out of Food in the Last Year: Never true  ° Ran Out of Food in the Last Year: Never true  °Transportation Needs: No Transportation Needs  ° Lack of Transportation (Medical): No  ° Lack of Transportation (Non-Medical): No  °Physical Activity: Unknown  ° Days of Exercise per Week: 0 days  ° Minutes of Exercise per Session: Not on file  °Stress: Stress Concern Present  ° Feeling of Stress : Rather much  °Social Connections: Moderately Isolated  ° Frequency of Communication with Friends and Family: Never  ° Frequency of Social Gatherings with Friends and Family: Once a week  ° Attends Religious Services: More than 4 times per year  ° Active Member of Clubs or Organizations: No  ° Attends Club or Organization Meetings: Never  ° Marital Status: Married  °Intimate Partner Violence: Not At Risk  ° Fear of Current or Ex-Partner: No  ° Emotionally Abused: No  ° Physically Abused: No  ° Sexually Abused: No  ° ° °FAMILY HISTORY: °Family History  °Problem Relation Age of Onset  ° Cancer Sister 54  °     ovarian  ° Depression Sister   ° Cancer Brother   °     CLL  ° Cancer Father 56  °     colon  ° Heart disease Father   ° Heart attack Father 60  °     died from MI  ° Hyperlipidemia Father   ° Hypertension Father   ° Cancer Mother   °     leukemia  ° Breast cancer Paternal  Aunt   °     40's  ° Migraines Neg Hx   ° ° °ALLERGIES:  is allergic to prednisone, amiodarone, hydrocodone-acetaminophen, oxycodone, tapentadol, tramadol, adhesive [tape], and latex. ° °MEDICATIONS:  °Current Outpatient Medications  °Medication Sig Dispense Refill  ° acetaminophen (TYLENOL) 500 MG tablet Take 500 mg by mouth every 6 (six) hours as needed. Taking as needed    ° acetaminophen-codeine (TYLENOL #3) 300-30 MG tablet Take 1 tablet by mouth every 6 (six) hours as needed for moderate pain or severe pain. 60 tablet 0  ° albuterol (VENTOLIN HFA) 108 (90 Base) MCG/ACT inhaler INHALE 2 PUFFS INTO THE LUNGS EVERY 8 (EIGHT) HOURS AS NEEDED FOR WHEEZING OR SHORTNESS OF BREATH. 6.7 g 0  ° ALPRAZolam (XANAX) 0.25 MG tablet Take 0.25 mg by mouth at bedtime as needed for anxiety.    ° azelastine (ASTELIN) 0.1 % nasal spray Place into both nostrils as needed for rhinitis. Use in each nostril as directed    ° B Complex Vitamins (VITAMIN B COMPLEX PO) Take 1 tablet by mouth daily.     ° busPIRone (BUSPAR) 5 MG tablet TAKE 1 TABLET BY MOUTH THREE TIMES A DAY AS NEEDED 60 tablet 1  ° Cholecalciferol (VITAMIN D3) 5000 units CAPS Take 5,000 Units by mouth daily.    ° DULoxetine (CYMBALTA) 30 MG capsule Take 1 capsule (30 mg total) by mouth 2 (two) times daily. Home med.    ° ELIQUIS 5 MG TABS tablet TAKE 1 TABLET BY MOUTH TWICE A DAY 180 tablet 1  ° esomeprazole (NEXIUM) 20 MG capsule Take 20 mg by mouth as needed.    ° furosemide (LASIX) 20 MG tablet Take 1 tablet (20 mg total) by mouth daily. 90 tablet 0  ° lidocaine-prilocaine (EMLA) cream Apply to affected area once 30 g 3  ° loperamide (IMODIUM) 2 MG   capsule Take 1 capsule (2 mg total) by mouth See admin instructions. Initial: 4 mg, followed by 2 mg after each loose stool; maximum: 16 mg/day 60 capsule 1  ° magic mouthwash SOLN Take 5 mLs by mouth 2 (two) times daily. 480 mL 1  ° Multiple Vitamin (MULTIVITAMIN) tablet Take 1 tablet by mouth daily.    ° nystatin  (MYCOSTATIN/NYSTOP) powder Apply 1 application topically 3 (three) times daily. 15 g 1  ° ondansetron (ZOFRAN) 8 MG tablet Take 1 tablet (8 mg total) by mouth every 8 (eight) hours as needed for nausea or vomiting. 60 tablet 0  ° potassium chloride SA (KLOR-CON) 20 MEQ tablet Take 1 tablet (20 mEq total) by mouth 2 (two) times daily. 120 tablet 2  ° prochlorperazine (COMPAZINE) 10 MG tablet Take 1 tablet (10 mg total) by mouth every 6 (six) hours as needed (Nausea or vomiting). 30 tablet 1  ° promethazine (PHENERGAN) 25 MG tablet Take 1 tablet (25 mg total) by mouth every 6 (six) hours as needed for nausea or vomiting. 60 tablet 0  ° ranolazine (RANEXA) 500 MG 12 hr tablet TAKE 1 TABLET BY MOUTH TWICE A DAY 180 tablet 0  ° rizatriptan (MAXALT) 10 MG tablet TAKE 1 TABLET (10 MG TOTAL) BY MOUTH AS NEEDED FOR MIGRAINE. MAY REPEAT IN 2 HOURS IF NEEDED 10 tablet 0  ° rOPINIRole (REQUIP) 2 MG tablet Take 1 tablet (2 mg total) by mouth at bedtime. 90 tablet 1  ° rosuvastatin (CRESTOR) 20 MG tablet TAKE 1 TABLET BY MOUTH EVERY DAY 90 tablet 0  ° bisoprolol (ZEBETA) 5 MG tablet Take 1 tablet (5 mg total) by mouth 2 (two) times daily. 180 tablet 1  ° °No current facility-administered medications for this visit.  ° ° ° °PHYSICAL EXAMINATION: °ECOG PERFORMANCE STATUS: 2 - Symptomatic, <50% confined to bed °Vitals:  ° 06/23/21 0903  °BP: 105/68  °Pulse: 97  °Temp: (!) 96.2 °F (35.7 °C)  ° ° °Filed Weights  ° 06/23/21 0903  °Weight: 267 lb (121.1 kg)  ° ° ° °Physical Exam °Constitutional:   °   General: She is not in acute distress. °   Appearance: She is obese.  °HENT:  °   Head: Normocephalic and atraumatic.  °Eyes:  °   General: No scleral icterus. °Cardiovascular:  °   Rate and Rhythm: Normal rate and regular rhythm.  °   Heart sounds: Normal heart sounds.  °Pulmonary:  °   Effort: Pulmonary effort is normal. No respiratory distress.  °   Breath sounds: No wheezing.  °Abdominal:  °   General: Bowel sounds are normal. There is  no distension.  °   Palpations: Abdomen is soft.  °Musculoskeletal:     °   General: No deformity. Normal range of motion.  °   Cervical back: Normal range of motion and neck supple.  °Skin: °   General: Skin is warm and dry.  °   Findings: No erythema or rash.  °Neurological:  °   Mental Status: She is alert and oriented to person, place, and time. Mental status is at baseline.  °   Cranial Nerves: No cranial nerve deficit.  °   Coordination: Coordination normal.  °Psychiatric:     °   Mood and Affect: Mood normal.  ° ° °. °LABORATORY DATA:  °I have reviewed the data as listed °Lab Results  °Component Value Date  ° WBC 2.4 (L) 06/23/2021  ° HGB 12.3 06/23/2021  °   HCT 36.6 06/23/2021   MCV 92.7 06/23/2021   PLT 242 06/23/2021   Recent Labs    06/09/21 0902 06/16/21 0846 06/23/21 0831  NA 140 138 137  K 3.9 4.0 3.4*  CL 105 103 103  CO2 _0 GLUCOSE 123* 105* 127*  BUN _1 CREATININE 1.01* 1.04* 1.08*  CALCIUM 8.5* 8.9 8.6*  GFRNONAA >60 59* 56*  PROT 6.4* 6.6 6.8  ALBUMIN 3.6 3.5 3.8  AST _2 ALT _3 ALKPHOS 71 66 64  BILITOT 0.6 0.7 1.0    Iron/TIBC/Ferritin/ %Sat    Component Value Date/Time   IRON 72 03/31/2021 0856   TIBC 364 03/31/2021 0856   FERRITIN 291 03/31/2021 0856   IRONPCTSAT 20 03/31/2021 0856      RADIOGRAPHIC STUDIES: I have personally reviewed the radiological images as listed and agreed with the findings in the report. No results found.    ASSESSMENT & PLAN:  1. HER2-positive carcinoma of breast ([place])   2. Encounter for antineoplastic chemotherapy   3. Anemia due to antineoplastic chemotherapy   4. Neuropathy   5. Chemotherapy induced diarrhea   6. Drug-induced neutropenia (HCC)    Cancer Staging  Malignant neoplasm of upper-outer quadrant of right female breast [facility]) Staging form: Breast, AJCC 8th Edition - Pathologic stage from 03/03/2021: Stage IA (pT1b, pN0, cM0, G3, ER-, PR+, HER2+) - Signed by [name], MD on  03/03/2021  #Right breast invasive carcinoma pT1b pN0 ER negative, PR weakly HER2 positive positive, HER 2 positive.  Patient tolerates dose reduced Taxol [27m/m2] and trastuzumab. Labs are reviewed and discussed with patient. Hold chemotherapy today.   #Chemotherapy-induced diarrhea, stable.  Imodium as needed. Patient has history of CHF, Repeat echocardiogram showed normal LVEF. Follow-up with cardiologist Dr. GRockey Situ.  # chemotherapy induced neutropenia, ANC 0.6 Afebrile. No signs of infection currently.  Plan Zarxio daily x 2. Rationale and side effects were discussed with patient.  She agrees with the plan. Claritin daily x 4 days.    #Pre-existing neuropathy in her fingertips bilaterally-Per patient this is secondary to cervical spine radiculopathy Monitor symptoms.   #Chemotherapy-induced anemia, hemoglobin has been stable.  Monitor. # she declined genetic testing #General body aches/arthritic pain.  Tylenol 3 PRN  Supportive care measures are necessary for patient well-being and will be provided as necessary. We spent sufficient time to discuss many aspect of care, questions were answered to patient's satisfaction.   All questions were answered. The patient knows to call the clinic with any problems questions or concerns.  cc ABurnard Hawthorne FNP  Follow-up  Taxol and trastuzumab.  1 week   ZEarlie Server MD, PhD 06/23/2021

## 2021-06-23 NOTE — Patient Instructions (Addendum)
[facility] CANCER CTR AT [place]  Discharge Instructions: Thank you for choosing [place] to provide your oncology and hematology care.  If you have a lab appointment with the [facility], please go directly to the [place] and check in at the registration area.  Wear comfortable clothing and clothing appropriate for easy access to any Portacath or PICC line.   We strive to give you quality time with your provider. You may need to reschedule your appointment if you arrive late (15 or more minutes).  Arriving late affects you and other patients whose appointments are after yours.  Also, if you miss three or more appointments without notifying the office, you may be dismissed from the clinic at the providers discretion.      For prescription refill requests, have your pharmacy contact our office and allow 72 hours for refills to be completed.    Today you received the following : Zarxio    Filgrastim, G-CSF injection What is this medication? FILGRASTIM, G-CSF (fil GRA stim) is a granulocyte colony-stimulating factor that stimulates the growth of neutrophils, a type of white blood cell (WBC) important in the body's fight against infection. It is used to reduce the incidence of fever and infection in patients with certain types of cancer who are receiving chemotherapy that affects the bone marrow, to stimulate blood cell production for removal of WBCs from the body prior to a bone marrow transplantation, to reduce the incidence of fever and infection in patients who have severe chronic neutropenia, and to improve survival outcomes following high-dose radiation exposure that is toxic to the bone marrow. This medicine may be used for other purposes; ask your health care provider or pharmacist if you have questions. COMMON BRAND NAME(S): Neupogen, Nivestym, Releuko, Zarxio What should I tell my care team before I take this medication? They need to know if you have  any of these conditions: kidney disease latex allergy ongoing radiation therapy sickle cell disease an unusual or allergic reaction to filgrastim, pegfilgrastim, other medicines, foods, dyes, or preservatives pregnant or trying to get pregnant breast-feeding How should I use this medication? This medicine is for injection under the skin or infusion into a vein. As an infusion into a vein, it is usually given by a health care professional in a hospital or clinic setting. If you get this medicine at home, you will be taught how to prepare and give this medicine. Refer to the Instructions for Use that come with your medication packaging. Use exactly as directed. Take your medicine at regular intervals. Do not take your medicine more often than directed. It is important that you put your used needles and syringes in a special sharps container. Do not put them in a trash can. If you do not have a sharps container, call your pharmacist or healthcare provider to get one. Talk to your pediatrician regarding the use of this medicine in children. While this drug may be prescribed for children as young as 7 months for selected conditions, precautions do apply. Overdosage: If you think you have taken too much of this medicine contact a poison control center or emergency room at once. NOTE: This medicine is only for you. Do not share this medicine with others. What if I miss a dose? It is important not to miss your dose. Call your doctor or health care professional if you miss a dose. What may interact with this medication? This medicine may interact with the following medications: medicines that may  cause a release of neutrophils, such as lithium This list may not describe all possible interactions. Give your health care provider a list of all the medicines, herbs, non-prescription drugs, or dietary supplements you use. Also tell them if you smoke, drink alcohol, or use illegal drugs. Some items may interact  with your medicine. What should I watch for while using this medication? Your condition will be monitored carefully while you are receiving this medicine. You may need blood work done while you are taking this medicine. Talk to your health care provider about your risk of cancer. You may be more at risk for certain types of cancer if you take this medicine. What side effects may I notice from receiving this medication? Side effects that you should report to your doctor or health care professional as soon as possible: allergic reactions like skin rash, itching or hives, swelling of the face, lips, or tongue back pain dizziness or feeling faint fever pain, redness, or irritation at site where injected pinpoint red spots on the skin shortness of breath or breathing problems signs and symptoms of kidney injury like trouble passing urine, change in the amount of urine, or red or dark-brown urine stomach or side pain, or pain at the shoulder swelling tiredness unusual bleeding or bruising Side effects that usually do not require medical attention (report to your doctor or health care professional if they continue or are bothersome): bone pain cough diarrhea hair loss headache muscle pain This list may not describe all possible side effects. Call your doctor for medical advice about side effects. You may report side effects to FDA at 1-800-FDA-1088. Where should I keep my medication? Keep out of the reach of children. Store in a refrigerator between 2 and 8 degrees C (36 and 46 degrees F). Do not freeze. Keep in carton to protect from light. Throw away this medicine if vials or syringes are left out of the refrigerator for more than 24 hours. Throw away any unused medicine after the expiration date. NOTE: This sheet is a summary. It may not cover all possible information. If you have questions about this medicine, talk to your doctor, pharmacist, or health care provider.  2022 Elsevier/Gold  Standard (2021-03-16 00:00:00)    To help prevent nausea and vomiting after your treatment, we encourage you to take your nausea medication as directed.  BELOW ARE SYMPTOMS THAT SHOULD BE REPORTED IMMEDIATELY: *FEVER GREATER THAN 100.4 F (38 C) OR HIGHER *CHILLS OR SWEATING *NAUSEA AND VOMITING THAT IS NOT CONTROLLED WITH YOUR NAUSEA MEDICATION *UNUSUAL SHORTNESS OF BREATH *UNUSUAL BRUISING OR BLEEDING *URINARY PROBLEMS (pain or burning when urinating, or frequent urination) *BOWEL PROBLEMS (unusual diarrhea, constipation, pain near the anus) TENDERNESS IN MOUTH AND THROAT WITH OR WITHOUT PRESENCE OF ULCERS (sore throat, sores in mouth, or a toothache) UNUSUAL RASH, SWELLING OR PAIN  UNUSUAL VAGINAL DISCHARGE OR ITCHING   Items with * indicate a potential emergency and should be followed up as soon as possible or go to the Emergency Department if any problems should occur.  Please show the CHEMOTHERAPY ALERT CARD or IMMUNOTHERAPY ALERT CARD at check-in to the Emergency Department and triage nurse.  Should you have questions after your visit or need to cancel or reschedule your appointment, please contact [facility] CANCER [place] AT [place]  [phone] and follow the prompts.  Office hours are 8:00 a.m. to 4:30 p.m. Monday - Friday. Please note that voicemails left after 4:00 p.m. may not be returned until the following business day.  We are closed weekends and major holidays. You have access to a nurse at all times for urgent questions. Please call the main number to the clinic [phone] and follow the prompts.  For any non-urgent questions, you may also contact your provider using MyChart. We now offer e-Visits for anyone 66 and older to request care online for non-urgent symptoms. For details visit [email or web address].   Also download the MyChart app! Go to the app store, search "MyChart", open the app, select [place], and log in with your MyChart username and  password.  Due to Covid, a mask is required upon entering the hospital/clinic. If you do not have a mask, one will be given to you upon arrival. For doctor visits, patients may have 1 support person aged 88 or older with them. For treatment visits, patients cannot have anyone with them due to current Covid guidelines and our immunocompromised population.

## 2021-06-23 NOTE — Progress Notes (Signed)
No tx today per MD. De-accessed port. Zarxio given SQ per order. Pt discharged stable.

## 2021-06-24 ENCOUNTER — Inpatient Hospital Stay: Payer: Medicare Other

## 2021-06-24 DIAGNOSIS — I4891 Unspecified atrial fibrillation: Secondary | ICD-10-CM | POA: Diagnosis not present

## 2021-06-24 DIAGNOSIS — E785 Hyperlipidemia, unspecified: Secondary | ICD-10-CM | POA: Diagnosis not present

## 2021-06-24 DIAGNOSIS — M8580 Other specified disorders of bone density and structure, unspecified site: Secondary | ICD-10-CM | POA: Diagnosis not present

## 2021-06-24 DIAGNOSIS — D709 Neutropenia, unspecified: Secondary | ICD-10-CM | POA: Diagnosis not present

## 2021-06-24 DIAGNOSIS — Z86010 Personal history of colonic polyps: Secondary | ICD-10-CM | POA: Diagnosis not present

## 2021-06-24 DIAGNOSIS — Z171 Estrogen receptor negative status [ER-]: Secondary | ICD-10-CM | POA: Diagnosis not present

## 2021-06-24 DIAGNOSIS — I509 Heart failure, unspecified: Secondary | ICD-10-CM | POA: Diagnosis not present

## 2021-06-24 DIAGNOSIS — C50411 Malignant neoplasm of upper-outer quadrant of right female breast: Secondary | ICD-10-CM | POA: Diagnosis not present

## 2021-06-24 DIAGNOSIS — T451X5A Adverse effect of antineoplastic and immunosuppressive drugs, initial encounter: Secondary | ICD-10-CM | POA: Diagnosis not present

## 2021-06-24 DIAGNOSIS — Z5111 Encounter for antineoplastic chemotherapy: Secondary | ICD-10-CM

## 2021-06-24 DIAGNOSIS — D6481 Anemia due to antineoplastic chemotherapy: Secondary | ICD-10-CM | POA: Diagnosis not present

## 2021-06-24 DIAGNOSIS — D702 Other drug-induced agranulocytosis: Secondary | ICD-10-CM

## 2021-06-24 DIAGNOSIS — Z806 Family history of leukemia: Secondary | ICD-10-CM | POA: Diagnosis not present

## 2021-06-24 DIAGNOSIS — R197 Diarrhea, unspecified: Secondary | ICD-10-CM | POA: Diagnosis not present

## 2021-06-24 DIAGNOSIS — J449 Chronic obstructive pulmonary disease, unspecified: Secondary | ICD-10-CM | POA: Diagnosis not present

## 2021-06-24 DIAGNOSIS — K219 Gastro-esophageal reflux disease without esophagitis: Secondary | ICD-10-CM | POA: Diagnosis not present

## 2021-06-24 MED ORDER — FILGRASTIM-SNDZ 480 MCG/0.8ML IJ SOSY
480.0000 ug | PREFILLED_SYRINGE | Freq: Once | INTRAMUSCULAR | Status: AC
  Administered 2021-06-24: 480 ug via SUBCUTANEOUS
  Filled 2021-06-24: qty 0.8

## 2021-06-30 ENCOUNTER — Other Ambulatory Visit: Payer: Self-pay

## 2021-06-30 ENCOUNTER — Inpatient Hospital Stay: Payer: Medicare Other

## 2021-06-30 ENCOUNTER — Encounter: Payer: Self-pay | Admitting: Oncology

## 2021-06-30 ENCOUNTER — Inpatient Hospital Stay (HOSPITAL_BASED_OUTPATIENT_CLINIC_OR_DEPARTMENT_OTHER): Payer: Medicare Other | Admitting: Oncology

## 2021-06-30 VITALS — BP 147/85 | HR 65 | Temp 96.7°F | Resp 16 | Wt 263.5 lb

## 2021-06-30 DIAGNOSIS — D6481 Anemia due to antineoplastic chemotherapy: Secondary | ICD-10-CM | POA: Diagnosis not present

## 2021-06-30 DIAGNOSIS — R197 Diarrhea, unspecified: Secondary | ICD-10-CM | POA: Diagnosis not present

## 2021-06-30 DIAGNOSIS — K521 Toxic gastroenteritis and colitis: Secondary | ICD-10-CM

## 2021-06-30 DIAGNOSIS — T451X5A Adverse effect of antineoplastic and immunosuppressive drugs, initial encounter: Secondary | ICD-10-CM

## 2021-06-30 DIAGNOSIS — Z806 Family history of leukemia: Secondary | ICD-10-CM | POA: Diagnosis not present

## 2021-06-30 DIAGNOSIS — G629 Polyneuropathy, unspecified: Secondary | ICD-10-CM

## 2021-06-30 DIAGNOSIS — Z86010 Personal history of colonic polyps: Secondary | ICD-10-CM | POA: Diagnosis not present

## 2021-06-30 DIAGNOSIS — K219 Gastro-esophageal reflux disease without esophagitis: Secondary | ICD-10-CM | POA: Diagnosis not present

## 2021-06-30 DIAGNOSIS — C50919 Malignant neoplasm of unspecified site of unspecified female breast: Secondary | ICD-10-CM

## 2021-06-30 DIAGNOSIS — D702 Other drug-induced agranulocytosis: Secondary | ICD-10-CM | POA: Diagnosis not present

## 2021-06-30 DIAGNOSIS — M2550 Pain in unspecified joint: Secondary | ICD-10-CM | POA: Diagnosis not present

## 2021-06-30 DIAGNOSIS — I4891 Unspecified atrial fibrillation: Secondary | ICD-10-CM | POA: Diagnosis not present

## 2021-06-30 DIAGNOSIS — E785 Hyperlipidemia, unspecified: Secondary | ICD-10-CM | POA: Diagnosis not present

## 2021-06-30 DIAGNOSIS — D709 Neutropenia, unspecified: Secondary | ICD-10-CM | POA: Diagnosis not present

## 2021-06-30 DIAGNOSIS — C50411 Malignant neoplasm of upper-outer quadrant of right female breast: Secondary | ICD-10-CM | POA: Diagnosis not present

## 2021-06-30 DIAGNOSIS — I509 Heart failure, unspecified: Secondary | ICD-10-CM | POA: Diagnosis not present

## 2021-06-30 DIAGNOSIS — Z5111 Encounter for antineoplastic chemotherapy: Secondary | ICD-10-CM

## 2021-06-30 DIAGNOSIS — J449 Chronic obstructive pulmonary disease, unspecified: Secondary | ICD-10-CM | POA: Diagnosis not present

## 2021-06-30 DIAGNOSIS — M8580 Other specified disorders of bone density and structure, unspecified site: Secondary | ICD-10-CM | POA: Diagnosis not present

## 2021-06-30 DIAGNOSIS — Z171 Estrogen receptor negative status [ER-]: Secondary | ICD-10-CM | POA: Diagnosis not present

## 2021-06-30 LAB — CBC WITH DIFFERENTIAL/PLATELET
Abs Immature Granulocytes: 1.44 10*3/uL — ABNORMAL HIGH (ref 0.00–0.07)
Basophils Absolute: 0.1 10*3/uL (ref 0.0–0.1)
Basophils Relative: 2 %
Eosinophils Absolute: 0 10*3/uL (ref 0.0–0.5)
Eosinophils Relative: 0 %
HCT: 33.1 % — ABNORMAL LOW (ref 36.0–46.0)
Hemoglobin: 11 g/dL — ABNORMAL LOW (ref 12.0–15.0)
Immature Granulocytes: 15 %
Lymphocytes Relative: 23 %
Lymphs Abs: 2.1 10*3/uL (ref 0.7–4.0)
MCH: 31.2 pg (ref 26.0–34.0)
MCHC: 33.2 g/dL (ref 30.0–36.0)
MCV: 93.8 fL (ref 80.0–100.0)
Monocytes Absolute: 1.3 10*3/uL — ABNORMAL HIGH (ref 0.1–1.0)
Monocytes Relative: 13 %
Neutro Abs: 4.5 10*3/uL (ref 1.7–7.7)
Neutrophils Relative %: 47 %
Platelets: 153 10*3/uL (ref 150–400)
RBC: 3.53 MIL/uL — ABNORMAL LOW (ref 3.87–5.11)
RDW: 14.4 % (ref 11.5–15.5)
Smear Review: NORMAL
WBC: 9.5 10*3/uL (ref 4.0–10.5)
nRBC: 0.3 % — ABNORMAL HIGH (ref 0.0–0.2)

## 2021-06-30 LAB — COMPREHENSIVE METABOLIC PANEL
ALT: 15 U/L (ref 0–44)
AST: 19 U/L (ref 15–41)
Albumin: 3.6 g/dL (ref 3.5–5.0)
Alkaline Phosphatase: 66 U/L (ref 38–126)
Anion gap: 10 (ref 5–15)
BUN: 11 mg/dL (ref 8–23)
CO2: 24 mmol/L (ref 22–32)
Calcium: 8.6 mg/dL — ABNORMAL LOW (ref 8.9–10.3)
Chloride: 104 mmol/L (ref 98–111)
Creatinine, Ser: 0.88 mg/dL (ref 0.44–1.00)
GFR, Estimated: >60 mL/min (ref >60)
Glucose, Bld: 108 mg/dL — ABNORMAL HIGH (ref 70–99)
Potassium: 3.4 mmol/L — ABNORMAL LOW (ref 3.5–5.1)
Sodium: 138 mmol/L (ref 135–145)
Total Bilirubin: 0.9 mg/dL (ref 0.3–1.2)
Total Protein: 6.5 g/dL (ref 6.5–8.1)

## 2021-06-30 MED ORDER — SODIUM CHLORIDE 0.9 % IV SOLN
Freq: Once | INTRAVENOUS | Status: AC
Start: 2021-06-30 — End: 2021-06-30
  Filled 2021-06-30: qty 250

## 2021-06-30 MED ORDER — FAMOTIDINE 20 MG IN NS 100 ML IVPB
20.0000 mg | Freq: Once | INTRAVENOUS | Status: AC
  Administered 2021-06-30: 20 mg via INTRAVENOUS
  Filled 2021-06-30: qty 20

## 2021-06-30 MED ORDER — TRASTUZUMAB-ANNS CHEMO 150 MG IV SOLR
2.0000 mg/kg | Freq: Once | INTRAVENOUS | Status: AC
  Administered 2021-06-30: 11:00:00 252 mg via INTRAVENOUS
  Filled 2021-06-30: qty 12

## 2021-06-30 MED ORDER — SODIUM CHLORIDE 0.9 % IV SOLN
65.0000 mg/m2 | Freq: Once | INTRAVENOUS | Status: AC
  Administered 2021-06-30: 11:00:00 156 mg via INTRAVENOUS
  Filled 2021-06-30: qty 26

## 2021-06-30 MED ORDER — SODIUM CHLORIDE 0.9% FLUSH
10.0000 mL | Freq: Once | INTRAVENOUS | Status: AC
  Administered 2021-06-30: 09:00:00 10 mL via INTRAVENOUS
  Filled 2021-06-30: qty 10

## 2021-06-30 MED ORDER — HEPARIN SOD (PORK) LOCK FLUSH 100 UNIT/ML IV SOLN
500.0000 [IU] | Freq: Once | INTRAVENOUS | Status: AC
  Administered 2021-06-30: 13:00:00 500 [IU] via INTRAVENOUS
  Filled 2021-06-30: qty 5

## 2021-06-30 MED ORDER — HEPARIN SOD (PORK) LOCK FLUSH 100 UNIT/ML IV SOLN
500.0000 [IU] | Freq: Once | INTRAVENOUS | Status: AC | PRN
  Administered 2021-06-30: 13:00:00 500 [IU]
  Filled 2021-06-30: qty 5

## 2021-06-30 MED ORDER — ACETAMINOPHEN 325 MG PO TABS
650.0000 mg | ORAL_TABLET | Freq: Once | ORAL | Status: AC
  Administered 2021-06-30: 10:00:00 650 mg via ORAL
  Filled 2021-06-30: qty 2

## 2021-06-30 MED ORDER — DIPHENHYDRAMINE HCL 50 MG/ML IJ SOLN
25.0000 mg | Freq: Once | INTRAMUSCULAR | Status: AC
  Administered 2021-06-30: 10:00:00 25 mg via INTRAVENOUS
  Filled 2021-06-30: qty 1

## 2021-06-30 MED ORDER — SODIUM CHLORIDE 0.9 % IV SOLN
10.0000 mg | Freq: Once | INTRAVENOUS | Status: AC
  Administered 2021-06-30: 10:00:00 10 mg via INTRAVENOUS
  Filled 2021-06-30: qty 10

## 2021-06-30 NOTE — Patient Instructions (Signed)
[facility] CANCER CTR AT [place]  Discharge Instructions: Thank you for choosing [facility] to provide your oncology and hematology care.  If you have a lab appointment with the [facility], please go directly to the [place] and check in at the registration area.  Wear comfortable clothing and clothing appropriate for easy access to any Portacath or PICC line.   We strive to give you quality time with your provider. You may need to reschedule your appointment if you arrive late (15 or more minutes).  Arriving late affects you and other patients whose appointments are after yours.  Also, if you miss three or more appointments without notifying the office, you may be dismissed from the clinic at the providers discretion.      For prescription refill requests, have your pharmacy contact our office and allow 72 hours for refills to be completed.    Today you received the following chemotherapy and/or immunotherapy agents: Taxol, Herceptin      To help prevent nausea and vomiting after your treatment, we encourage you to take your nausea medication as directed.  BELOW ARE SYMPTOMS THAT SHOULD BE REPORTED IMMEDIATELY: *FEVER GREATER THAN 100.4 F (38 C) OR HIGHER *CHILLS OR SWEATING *NAUSEA AND VOMITING THAT IS NOT CONTROLLED WITH YOUR NAUSEA MEDICATION *UNUSUAL SHORTNESS OF BREATH *UNUSUAL BRUISING OR BLEEDING *URINARY PROBLEMS (pain or burning when urinating, or frequent urination) *BOWEL PROBLEMS (unusual diarrhea, constipation, pain near the anus) TENDERNESS IN MOUTH AND THROAT WITH OR WITHOUT PRESENCE OF ULCERS (sore throat, sores in mouth, or a toothache) UNUSUAL RASH, SWELLING OR PAIN  UNUSUAL VAGINAL DISCHARGE OR ITCHING   Items with * indicate a potential emergency and should be followed up as soon as possible or go to the Emergency Department if any problems should occur.  Please show the CHEMOTHERAPY ALERT CARD or IMMUNOTHERAPY ALERT CARD at  check-in to the Emergency Department and triage nurse.  Should you have questions after your visit or need to cancel or reschedule your appointment, please contact [facility] CANCER [place] AT [place]  [phone] and follow the prompts.  Office hours are 8:00 a.m. to 4:30 p.m. Monday - Friday. Please note that voicemails left after 4:00 p.m. may not be returned until the following business day.  We are closed weekends and major holidays. You have access to a nurse at all times for urgent questions. Please call the main number to the clinic [phone] and follow the prompts.  For any non-urgent questions, you may also contact your provider using MyChart. We now offer e-Visits for anyone 63 and older to request care online for non-urgent symptoms. For details visit [email or web address].   Also download the MyChart app! Go to the app store, search "MyChart", open the app, select [place], and log in with your MyChart username and password.  Due to Covid, a mask is required upon entering the hospital/clinic. If you do not have a mask, one will be given to you upon arrival. For doctor visits, patients may have 1 support person aged 51 or older with them. For treatment visits, patients cannot have anyone with them due to current Covid guidelines and our immunocompromised population. Paclitaxel injection What is this medication? PACLITAXEL (PAK li TAX el) is a chemotherapy drug. It targets fast dividing cells, like cancer cells, and causes these cells to die. This medicine is used to treat ovarian cancer, breast cancer, lung cancer, Kaposi's sarcoma, and other cancers. This medicine may be used for other purposes; ask your  health care provider or pharmacist if you have questions. COMMON BRAND NAME(S): Onxol, Taxol What should I tell my care team before I take this medication? They need to know if you have any of these conditions: history of irregular heartbeat liver disease low blood  counts, like low white cell, platelet, or red cell counts lung or breathing disease, like asthma tingling of the fingers or toes, or other nerve disorder an unusual or allergic reaction to paclitaxel, alcohol, polyoxyethylated castor oil, other chemotherapy, other medicines, foods, dyes, or preservatives pregnant or trying to get pregnant breast-feeding How should I use this medication? This drug is given as an infusion into a vein. It is administered in a hospital or clinic by a specially trained health care professional. Talk to your pediatrician regarding the use of this medicine in children. Special care may be needed. Overdosage: If you think you have taken too much of this medicine contact a poison control center or emergency room at once. NOTE: This medicine is only for you. Do not share this medicine with others. What if I miss a dose? It is important not to miss your dose. Call your doctor or health care professional if you are unable to keep an appointment. What may interact with this medication? Do not take this medicine with any of the following medications: live virus vaccines This medicine may also interact with the following medications: antiviral medicines for hepatitis, HIV or AIDS certain antibiotics like erythromycin and clarithromycin certain medicines for fungal infections like ketoconazole and itraconazole certain medicines for seizures like carbamazepine, phenobarbital, phenytoin gemfibrozil nefazodone rifampin St. John's wort This list may not describe all possible interactions. Give your health care provider a list of all the medicines, herbs, non-prescription drugs, or dietary supplements you use. Also tell them if you smoke, drink alcohol, or use illegal drugs. Some items may interact with your medicine. What should I watch for while using this medication? Your condition will be monitored carefully while you are receiving this medicine. You will need important  blood work done while you are taking this medicine. This medicine can cause serious allergic reactions. To reduce your risk you will need to take other medicine(s) before treatment with this medicine. If you experience allergic reactions like skin rash, itching or hives, swelling of the face, lips, or tongue, tell your doctor or health care professional right away. In some cases, you may be given additional medicines to help with side effects. Follow all directions for their use. This drug may make you feel generally unwell. This is not uncommon, as chemotherapy can affect healthy cells as well as cancer cells. Report any side effects. Continue your course of treatment even though you feel ill unless your doctor tells you to stop. Call your doctor or health care professional for advice if you get a fever, chills or sore throat, or other symptoms of a cold or flu. Do not treat yourself. This drug decreases your body's ability to fight infections. Try to avoid being around people who are sick. This medicine may increase your risk to bruise or bleed. Call your doctor or health care professional if you notice any unusual bleeding. Be careful brushing and flossing your teeth or using a toothpick because you may get an infection or bleed more easily. If you have any dental work done, tell your dentist you are receiving this medicine. Avoid taking products that contain aspirin, acetaminophen, ibuprofen, naproxen, or ketoprofen unless instructed by your doctor. These medicines may hide a fever.  Do not become pregnant while taking this medicine. Women should inform their doctor if they wish to become pregnant or think they might be pregnant. There is a potential for serious side effects to an unborn child. Talk to your health care professional or pharmacist for more information. Do not breast-feed an infant while taking this medicine. Men are advised not to father a child while receiving this medicine. This product  may contain alcohol. Ask your pharmacist or healthcare provider if this medicine contains alcohol. Be sure to tell all healthcare providers you are taking this medicine. Certain medicines, like metronidazole and disulfiram, can cause an unpleasant reaction when taken with alcohol. The reaction includes flushing, headache, nausea, vomiting, sweating, and increased thirst. The reaction can last from 30 minutes to several hours. What side effects may I notice from receiving this medication? Side effects that you should report to your doctor or health care professional as soon as possible: allergic reactions like skin rash, itching or hives, swelling of the face, lips, or tongue breathing problems changes in vision fast, irregular heartbeat high or low blood pressure mouth sores pain, tingling, numbness in the hands or feet signs of decreased platelets or bleeding - bruising, pinpoint red spots on the skin, black, tarry stools, blood in the urine signs of decreased red blood cells - unusually weak or tired, feeling faint or lightheaded, falls signs of infection - fever or chills, cough, sore throat, pain or difficulty passing urine signs and symptoms of liver injury like dark yellow or brown urine; general ill feeling or flu-like symptoms; light-colored stools; loss of appetite; nausea; right upper belly pain; unusually weak or tired; yellowing of the eyes or skin swelling of the ankles, feet, hands unusually slow heartbeat Side effects that usually do not require medical attention (report to your doctor or health care professional if they continue or are bothersome): diarrhea hair loss loss of appetite muscle or joint pain nausea, vomiting pain, redness, or irritation at site where injected tiredness This list may not describe all possible side effects. Call your doctor for medical advice about side effects. You may report side effects to FDA at 1-800-FDA-1088. Where should I keep my  medication? This drug is given in a hospital or clinic and will not be stored at home. NOTE: This sheet is a summary. It may not cover all possible information. If you have questions about this medicine, talk to your doctor, pharmacist, or health care provider.  2022 Elsevier/Gold Standard (2021-03-16 00:00:00) Trastuzumab injection for infusion What is this medication? TRASTUZUMAB (tras TOO zoo mab) is a monoclonal antibody. It is used to treat breast cancer and stomach cancer. This medicine may be used for other purposes; ask your health care provider or pharmacist if you have questions. COMMON BRAND NAME(S): Herceptin, Janae Bridgeman, Ontruzant, Trazimera What should I tell my care team before I take this medication? They need to know if you have any of these conditions: heart disease heart failure lung or breathing disease, like asthma an unusual or allergic reaction to trastuzumab, benzyl alcohol, or other medications, foods, dyes, or preservatives pregnant or trying to get pregnant breast-feeding How should I use this medication? This drug is given as an infusion into a vein. It is administered in a hospital or clinic by a specially trained health care professional. Talk to your pediatrician regarding the use of this medicine in children. This medicine is not approved for use in children. Overdosage: If you think you have taken too much of  this medicine contact a poison control center or emergency room at once. NOTE: This medicine is only for you. Do not share this medicine with others. What if I miss a dose? It is important not to miss a dose. Call your doctor or health care professional if you are unable to keep an appointment. What may interact with this medication? This medicine may interact with the following medications: certain types of chemotherapy, such as daunorubicin, doxorubicin, epirubicin, and idarubicin This list may not describe all possible interactions.  Give your health care provider a list of all the medicines, herbs, non-prescription drugs, or dietary supplements you use. Also tell them if you smoke, drink alcohol, or use illegal drugs. Some items may interact with your medicine. What should I watch for while using this medication? Visit your doctor for checks on your progress. Report any side effects. Continue your course of treatment even though you feel ill unless your doctor tells you to stop. Call your doctor or health care professional for advice if you get a fever, chills or sore throat, or other symptoms of a cold or flu. Do not treat yourself. Try to avoid being around people who are sick. You may experience fever, chills and shaking during your first infusion. These effects are usually mild and can be treated with other medicines. Report any side effects during the infusion to your health care professional. Fever and chills usually do not happen with later infusions. Do not become pregnant while taking this medicine or for 7 months after stopping it. Women should inform their doctor if they wish to become pregnant or think they might be pregnant. Women of child-bearing potential will need to have a negative pregnancy test before starting this medicine. There is a potential for serious side effects to an unborn child. Talk to your health care professional or pharmacist for more information. Do not breast-feed an infant while taking this medicine or for 7 months after stopping it. Women must use effective birth control with this medicine. What side effects may I notice from receiving this medication? Side effects that you should report to your doctor or health care professional as soon as possible: allergic reactions like skin rash, itching or hives, swelling of the face, lips, or tongue chest pain or palpitations cough dizziness feeling faint or lightheaded, falls fever general ill feeling or flu-like symptoms signs of worsening heart  failure like breathing problems; swelling in your legs and feet unusually weak or tired Side effects that usually do not require medical attention (report to your doctor or health care professional if they continue or are bothersome): bone pain changes in taste diarrhea joint pain nausea/vomiting weight loss This list may not describe all possible side effects. Call your doctor for medical advice about side effects. You may report side effects to FDA at 1-800-FDA-1088. Where should I keep my medication? This drug is given in a hospital or clinic and will not be stored at home. NOTE: This sheet is a summary. It may not cover all possible information. If you have questions about this medicine, talk to your doctor, pharmacist, or health care provider.  2022 Elsevier/Gold Standard (2016-07-12 00:00:00)

## 2021-06-30 NOTE — Progress Notes (Signed)
°Hematology/Oncology Progress note °[phone] [phone] ° ° °Patient Care Team: °Barnes, Amber G, FNP as PCP - General (Family Medicine) °Barnes, Amber J, MD as PCP - Cardiology (Cardiology) °Barnes, Brad, MD (Inactive) °Barnes, Amber J, MD as Consulting Physician (Cardiology) °Barnes, Amber M, RN as Oncology Nurse Navigator °Amber Kocsis, MD as Consulting Physician (Hematology and Oncology) ° °REFERRING PROVIDER: °Barnes, Amber G, FNP  °CHIEF COMPLAINTS/REASON FOR VISIT:  °Follow-up for stage I HER2 positive breast cancer ° °HISTORY OF PRESENTING ILLNESS:  ° °Amber Barnes is a  70 y.o.  female with PMH listed below was seen in consultation at the request of  Barnes, Amber G, FNP  for evaluation of weight loss, family history ovarian cancer. °Patient reports a history of nausea vomiting, early satiety, dysphagia symptoms that started in March.  She has lost significant amount of weight as well. °Patient weighed 340 pounds in February 2021, 309 pounds in May 2021 and currently she weighs 296 pounds today. °Patient has tried Zofran and scopolamine patch which did not relieve her symptoms. °11/05/2019, CT abdomen pelvis showed chronic changes with out acute abnormality to correspond with the patient's symptoms. °Patient has had right upper quadrant ultrasound done on 11/14/2019, which showed hepatic steatosis with no other acute abnormality. °Patient was seen by gastroenterology Amber Barnes on 11/18/2019.  Patient was recommended to take PPI daily.  Added Carafate.  H. pylori testing came back positive and the patient was started on a course of tetracycline, metronidazole, Pepto-Bismol for H. pylori treatments.  Patient reports that she has been on the treatment for 7 days now.  Nausea is slightly better but still persistent.  She is following up with gastroenterology next Barnes for discussion of EGD. °Currently she is not able to tolerate colonoscopy prep due to the nausea.  Her stool  was tested positive for occult blood. °She also reports night sweating which is a chronic symptom for her. °Patient was last seen on 05/20/2020 for evaluation of weight loss, family history of ovarian cancer patient was referred to establish care with genetic counselor.  Patient eventually declined genetic testing. ° °12/30/2020, patient had a screening mammogram done which showed possible mass and asymmetry in the right breast. °01/08/2021, diagnostic mammogram of the right breast showed 2 adjacent irregular spiculated masses.  Larger 1 was 10 mm and a smaller mass measured 5 mm. °01/15/2021 stereotactic biopsy of the right breast smaller mass showed invasive carcinoma no special type, grade 2, DCIS present, LVI negative. °Ultrasound guided biopsy of the larger mass showed invasive mammary carcinoma,  ° °Patient has appointment with surgery Dr. Rodenberg later this Barnes.  She was referred to establish care with oncology for evaluation.  She was accompanied by daughter. °She has no new complaints. ° °Family history of breast cancer: Denies °Family history of other cancers: Patient has a strong family history of leukemia in her mother, colon cancer in her father at age of 56, ovarian cancer in her sister at age of 54, CLL in her brother ° °02/17/2021, patient is status post lumpectomy and sentinel lymph node biopsy °Pathology showed invasive mammary carcinoma, 2 separate foci, DCIS, negative sentinel lymph node biopsy 0/1, grade 3, or margins negative for invasive carcinoma.  ER -, PR + [1-10%], HER2 +  pT1b(m) pN0  ° °# Strong family history of cancer, Discussed again about genetic testing. patient adamantly declined. ° °#  Intermittent dizziness which per patient is chronic for her due to the vertigo. °#03/23/2021 Mediport by vascular surgeon °#History   of CHF, 03/08/2021, echocardiogram showed LVEF 60-65%. Per patient, she has been seen by Dr. Donivan Barnes team and was cleared for proceeding with chemotherapy.   03/24/2021  - 03/31/2021 Taxol 80 mg/m2 Amber Barnes 04/05/2021 - 04/09/2021 patient was admitted due to neutropenic fever with associated SIRS.  Patient was treated with empiric cefepime and vancomycin until date of discharge when [place] recovered to 1.4.  Post hospitalization, patient also had COVID-19 infection so her follow-up appointment was further postponed for another 2 weeks.  05/10/2021 resumed dose reduce Taxol $RemoveBefo'65mg'gonGimHwntA$ /m2 Amber Barnes  INTERVAL HISTORY Amber Barnes is a 70 y.o. female who has above history reviewed by me today presents for follow up visit for management of right side HER 2 positive breast cancer No fever or chills.  Patient received Zarxio daily for 2 doses daily for 2 doses last Barnes.  She reports having watery eyes for which she took allergy medication with symptom relief.  She stopped taking allergy medication and today she started to have runny nose and watery eyes again. Patient has experienced several episodes of loose bowel movement and symptoms are resolved after taking antidiarrhea medications.  Review of Systems  Constitutional:  Positive for fatigue. Negative for appetite change, chills, fever and unexpected weight change.       Patient sits in the wheelchair  HENT:   Negative for hearing loss and voice change.   Eyes:        Watery eyes  Respiratory:  Negative for chest tightness and cough.   Cardiovascular:  Negative for chest pain.  Gastrointestinal:  Negative for abdominal distention, abdominal pain, blood in stool, nausea and vomiting.  Endocrine: Negative for hot flashes.  Genitourinary:  Negative for difficulty urinating and frequency.   Musculoskeletal:  Negative for arthralgias.  Skin:  Negative for itching and rash.  Neurological:  Positive for numbness. Negative for dizziness and extremity weakness.  Hematological:  Negative for adenopathy.  Psychiatric/Behavioral:  Negative for confusion.    MEDICAL HISTORY:  Past Medical History:  Diagnosis Date    Achilles tendinitis    Acute medial meniscal tear    Allergy    Anxiety    Atrial fibrillation ([place])    a. new onset 07/2016; b. CHADS2VASc --> 4 (HTN, female, age, CHF); c. eliquis   CHF (congestive heart failure) (HCC)    Chronic anticoagulation    Apixaban   Chronic lower back pain    Colon polyp    Complication of anesthesia    Emergence delirium; hypoxia   COPD (chronic obstructive pulmonary disease) ([place])    Cystic breast    Depression    Endometriosis    Fibrocystic breast disease    GERD (gastroesophageal reflux disease)    History of stress test    a. 07/2016 MV: EF 55-65%. Small, mild defect  in mid anteroseptal, apical septal, and apical locations. No ischemia-->low risk.   Hyperlipidemia    Hypertension    Insomnia    Lipoma    Malignant neoplasm of upper-outer quadrant of right female breast (HCC)    Stage 1A invasive mammary carcinoma (cT1b or T1c N0); ER (-), PR (+), HER2/neu (+)   Migraine    Mild Mitral regurgitation    a. 07/2016 Echo: EF 55-65%, no rwma, mild MR. Mildly dil LA. Nl RV fx.    OSA (obstructive sleep apnea)    non-compliant with nocturnal PAP therapy   Osteoarthritis    left knee   Osteopenia    Peptic ulcer disease    a.  H. pylori   Pneumonia    RLS (restless legs syndrome)    Tendonitis    Thyroid disease    Vitamin D deficiency     SURGICAL HISTORY: Past Surgical History:  Procedure Laterality Date   ABDOMINAL HYSTERECTOMY     ovaries left   ACHILLES TENDON SURGERY     2012,01/2017   BACK SURGERY     fusion lumbar   BREAST BIOPSY Right 01/15/2021   Affirm biopsy-"X" clip-INVASIVE MAMMARY CARCINOMA   BREAST BIOPSY Right 01/15/2021   u/s bx-"venus" clip INVASIVE MAMMARY CARCINOMA   BREAST BIOPSY     BREAST LUMPECTOMY,RADIO FREQ LOCALIZER,AXILLARY SENTINEL LYMPH NODE BIOPSY Right 02/17/2021   Procedure: BREAST LUMPECTOMY,RADIO FREQ LOCALIZER,AXILLARY SENTINEL LYMPH NODE BIOPSY;  Surgeon: Ronny Bacon, MD;  Location: [facility];   Service: General;  Laterality: Right;   CARDIOVERSION N/A 09/07/2016   Procedure: CARDIOVERSION;  Surgeon: Minna Merritts, MD;  Location: [facility];  Service: Cardiovascular;  Laterality: N/A;   CARDIOVERSION N/A 10/19/2016   Procedure: CARDIOVERSION;  Surgeon: Minna Merritts, MD;  Location: [facility];  Service: Cardiovascular;  Laterality: N/A;   CARDIOVERSION N/A 12/14/2016   Procedure: CARDIOVERSION;  Surgeon: Minna Merritts, MD;  Location: [facility];  Service: Cardiovascular;  Laterality: N/A;   COLONOSCOPY     COLONOSCOPY WITH PROPOFOL N/A 05/13/2020   Procedure: COLONOSCOPY WITH PROPOFOL;  Surgeon: Barnes, Benay Pike, MD;  Location: [facility];  Service: Gastroenterology;  Laterality: N/A;  ELIQUIS   ESOPHAGOGASTRODUODENOSCOPY     ESOPHAGOGASTRODUODENOSCOPY (EGD) WITH PROPOFOL N/A 05/13/2020   Procedure: ESOPHAGOGASTRODUODENOSCOPY (EGD) WITH PROPOFOL;  Surgeon: Barnes, Benay Pike, MD;  Location: [facility];  Service: Gastroenterology;  Laterality: N/A;   FOOT SURGERY Left    tendon   JOINT REPLACEMENT Right    hip   PORTA CATH INSERTION N/A 03/23/2021   Procedure: PORTA CATH INSERTION;  Surgeon: Katha Cabal, MD;  Location: [facility];  Service: Cardiovascular;  Laterality: N/A;   PORTACATH PLACEMENT N/A 03/23/2021   Procedure: ATTEMPTED INSERTION PORT-A-CATH;  Surgeon: Ronny Bacon, MD;  Location: [facility];  Service: General;  Laterality: N/A;   THYROIDECTOMY     thyroid lobectomy secondary to a benign nodule   TOTAL HIP ARTHROPLASTY Right 08/21/2018   Procedure: TOTAL HIP ARTHROPLASTY ANTERIOR APPROACH-RIGHT CELL SAVER REQUESTED;  Surgeon: [name], MD;  Location: [facility];  Service: Orthopedics;  Laterality: Right;    SOCIAL HISTORY: Social History   Socioeconomic History   Marital status: Married    Spouse name: jeffrey   Number of children: 1   Years of education: Not on file   Highest education level: High school graduate  Occupational  History    Comment: disabled  Tobacco Use   Smoking status: Former    Packs/day: 0.25    Years: 10.00    Pack years: 2.50    Types: Cigarettes    Quit date: 2005    Years since quitting: 17.9   Smokeless tobacco: Never   Tobacco comments:    smoked for 10 years, 1 pack every 2-3 days  Vaping Use   Vaping Use: Never used  Substance and Sexual Activity   Alcohol use: Not Currently    Alcohol/Barnes: 1.0 standard drink    Types: 1 Glasses of wine per Barnes    Comment: rarely   Drug use: No   Sexual activity: Not Currently  Other Topics Concern   Not on file  Social History Narrative   Moved to [place] from  , originally from [place].    1 cup of caffeine daily   Right handed          Social Determinants of Health   Financial Resource Strain: Low Risk    Difficulty of Paying Living Expenses: Not very hard  Food Insecurity: No Food Insecurity   Worried About Programme researcher, broadcasting/film/video in the Last Year: Never true   Ran Out of Food in the Last Year: Never true  Transportation Needs: No Transportation Needs   Lack of Transportation (Medical): No   Lack of Transportation (Non-Medical): No  Physical Activity: Unknown   Days of Exercise per Barnes: 0 days   Minutes of Exercise per Session: Not on file  Stress: Stress Concern Present   Feeling of Stress : Rather much  Social Connections: Moderately Isolated   Frequency of Communication with Friends and Family: Never   Frequency of Social Gatherings with Friends and Family: Once a Barnes   Attends Religious Services: More than 4 times per year   Active Member of [employer] or Organizations: No   Attends Engineer, structural: Never   Marital Status: Married  Catering manager Violence: Not At Risk   Fear of Current or Ex-Partner: No   Emotionally Abused: No   Physically Abused: No   Sexually Abused: No    FAMILY HISTORY: Family History  Problem Relation Age of Onset   Cancer Sister 53       ovarian    Depression Sister    Cancer Brother        CLL   Cancer Father 30       colon   Heart disease Father    Heart attack Father 26       died from MI   Hyperlipidemia Father    Hypertension Father    Cancer Mother        leukemia   Breast cancer Paternal Aunt        66's   Migraines Neg Hx     ALLERGIES:  is allergic to prednisone, amiodarone, hydrocodone-acetaminophen, oxycodone, tapentadol, tramadol, adhesive [tape], and latex.  MEDICATIONS:  Current Outpatient Medications  Medication Sig Dispense Refill   acetaminophen (TYLENOL) 500 MG tablet Take 500 mg by mouth every 6 (six) hours as needed. Taking as needed     acetaminophen-codeine (TYLENOL #3) 300-30 MG tablet Take 1 tablet by mouth every 6 (six) hours as needed for moderate pain or severe pain. 60 tablet 0   albuterol (VENTOLIN HFA) 108 (90 Base) MCG/ACT inhaler INHALE 2 PUFFS INTO THE LUNGS EVERY 8 (EIGHT) HOURS AS NEEDED FOR WHEEZING OR SHORTNESS OF BREATH. 6.7 Barnes 0   ALPRAZolam (XANAX) 0.25 MG tablet Take 0.25 mg by mouth at bedtime as needed for anxiety.     azelastine (ASTELIN) 0.1 % nasal spray Place into both nostrils as needed for rhinitis. Use in each nostril as directed     B Complex Vitamins (VITAMIN B COMPLEX PO) Take 1 tablet by mouth daily.      busPIRone (BUSPAR) 5 MG tablet TAKE 1 TABLET BY MOUTH THREE TIMES A DAY AS NEEDED 60 tablet 1   Cholecalciferol (VITAMIN D3) 5000 units CAPS Take 5,000 Units by mouth daily.     DULoxetine (CYMBALTA) 30 MG capsule Take 1 capsule (30 mg total) by mouth 2 (two) times daily. Home med.     ELIQUIS 5 MG TABS tablet TAKE 1 TABLET BY MOUTH TWICE A DAY 180 tablet 1  esomeprazole (NEXIUM) 20 MG capsule Take 20 mg by mouth as needed.     furosemide (LASIX) 20 MG tablet Take 1 tablet (20 mg total) by mouth daily. 90 tablet 0   lidocaine-prilocaine (EMLA) cream Apply to affected area once 30 Barnes 3   loperamide (IMODIUM) 2 MG capsule Take 1 capsule (2 mg total) by mouth See admin  instructions. Initial: 4 mg, followed by 2 mg after each loose stool; maximum: 16 mg/day 60 capsule 1   magic mouthwash SOLN Take 5 mLs by mouth 2 (two) times daily. 480 mL 1   Multiple Vitamin (MULTIVITAMIN) tablet Take 1 tablet by mouth daily.     nystatin (MYCOSTATIN/NYSTOP) powder Apply 1 application topically 3 (three) times daily. 15 Barnes 1   ondansetron (ZOFRAN) 8 MG tablet Take 1 tablet (8 mg total) by mouth every 8 (eight) hours as needed for nausea or vomiting. 60 tablet 0   potassium chloride SA (KLOR-CON) 20 MEQ tablet Take 1 tablet (20 mEq total) by mouth 2 (two) times daily. 120 tablet 2   prochlorperazine (COMPAZINE) 10 MG tablet Take 1 tablet (10 mg total) by mouth every 6 (six) hours as needed (Nausea or vomiting). 30 tablet 1   promethazine (PHENERGAN) 25 MG tablet Take 1 tablet (25 mg total) by mouth every 6 (six) hours as needed for nausea or vomiting. 60 tablet 0   ranolazine (RANEXA) 500 MG 12 hr tablet TAKE 1 TABLET BY MOUTH TWICE A DAY 180 tablet 0   rizatriptan (MAXALT) 10 MG tablet TAKE 1 TABLET (10 MG TOTAL) BY MOUTH AS NEEDED FOR MIGRAINE. MAY REPEAT IN 2 HOURS IF NEEDED 10 tablet 0   rOPINIRole (REQUIP) 2 MG tablet Take 1 tablet (2 mg total) by mouth at bedtime. 90 tablet 1   rosuvastatin (CRESTOR) 20 MG tablet TAKE 1 TABLET BY MOUTH EVERY DAY 90 tablet 0   bisoprolol (ZEBETA) 5 MG tablet Take 1 tablet (5 mg total) by mouth 2 (two) times daily. 180 tablet 1   No current facility-administered medications for this visit.     PHYSICAL EXAMINATION: ECOG PERFORMANCE STATUS: 2 - Symptomatic, <50% confined to bed Vitals:   06/30/21 0842  BP: (!) 147/85  Pulse: 65  Resp: 16  Temp: (!) 96.7 F (35.9 C)  SpO2: 99%    Filed Weights   06/30/21 0842  Weight: 263 lb 8 oz (119.5 kg)     Physical Exam Constitutional:      General: She is not in acute distress.    Appearance: She is obese.  HENT:     Head: Normocephalic and atraumatic.  Eyes:     General: No  scleral icterus. Cardiovascular:     Rate and Rhythm: Normal rate and regular rhythm.     Heart sounds: Normal heart sounds.  Pulmonary:     Effort: Pulmonary effort is normal. No respiratory distress.     Breath sounds: No wheezing.  Abdominal:     General: Bowel sounds are normal. There is no distension.     Palpations: Abdomen is soft.  Musculoskeletal:        General: No deformity. Normal range of motion.     Cervical back: Normal range of motion and neck supple.  Skin:    General: Skin is warm and dry.     Findings: No erythema or rash.  Neurological:     Mental Status: She is alert and oriented to person, place, and time. Mental status is at baseline.  Cranial Nerves: No cranial nerve deficit.     Coordination: Coordination normal.  Psychiatric:        Mood and Affect: Mood normal.    . LABORATORY DATA:  I have reviewed the data as listed Lab Results  Component Value Date   WBC 9.5 06/30/2021   HGB 11.0 (L) 06/30/2021   HCT 33.1 (L) 06/30/2021   MCV 93.8 06/30/2021   PLT 153 06/30/2021   Recent Labs    06/16/21 0846 06/23/21 0831 06/30/21 0826  NA 138 137 138  K 4.0 3.4* 3.4*  CL 103 103 104  CO2 $Re'24 24 24  'MUp$ GLUCOSE 105* 127* 108*  BUN $Re'22 14 11  'wZY$ CREATININE 1.04* 1.08* 0.88  CALCIUM 8.9 8.6* 8.6*  GFRNONAA 59* 56* >60  PROT 6.6 6.8 6.5  ALBUMIN 3.5 3.8 3.6  AST $Re'16 19 19  'AQJ$ ALT $R'15 17 15  'aj$ ALKPHOS 66 64 66  BILITOT 0.7 1.0 0.9    Iron/TIBC/Ferritin/ %Sat    Component Value Date/Time   IRON 72 03/31/2021 0856   TIBC 364 03/31/2021 0856   FERRITIN 291 03/31/2021 0856   IRONPCTSAT 20 03/31/2021 0856      RADIOGRAPHIC STUDIES: I have personally reviewed the radiological images as listed and agreed with the findings in the report. No results found.    ASSESSMENT & PLAN:  1. HER2-positive carcinoma of breast ([place])   2. Encounter for antineoplastic chemotherapy   3. Anemia due to antineoplastic chemotherapy   4. Drug-induced neutropenia (HCC)    5. Neuropathy   6. Chemotherapy induced diarrhea   7. Arthralgia, unspecified joint    Cancer Staging  Malignant neoplasm of upper-outer quadrant of right female breast [facility]) Staging form: Breast, AJCC 8th Edition - Pathologic stage from 03/03/2021: Stage IA (pT1b, pN0, cM0, G3, ER-, PR+, HER2+) - Signed by [name], MD on 03/03/2021  #Right breast invasive carcinoma pT1b pN0 ER negative, PR weakly HER2 positive positive, HER 2 positive.  Patient tolerates dose reduced Taxol [$RemoveBefore'65mg'ZPbTmBFLdbUye$ /m2] and trastuzumab. Labs are reviewed and discussed with patient. Proceed with dose reduced Taxol and trastuzumab.  Today.  #Chemotherapy-induced diarrhea, stable.  Imodium as needed. Patient has history of CHF, Repeat echocardiogram showed normal LVEF. Follow-up with cardiologist Dr. [name]..  # chemotherapy induced neutropenia, status post Zarxio #Watery eyes and runny nose, likely allergy symptoms incidentally started after treatments.  Recommend patient to take allergy medication.  If symptoms worse, recommend patient to have influenza/COVID-19 tested.   #Pre-existing neuropathy in her fingertips bilaterally-Per patient this is secondary to cervical spine radiculopathy Monitor symptoms.   #Chemotherapy-induced anemia, hemoglobin has been stable.  Monitor. # she declined genetic testing #General body aches/arthritic pain.  Tylenol 3 PRN  Supportive care measures are necessary for patient well-being and will be provided as necessary. We spent sufficient time to discuss many aspect of care, questions were answered to patient's satisfaction.   All questions were answered. The patient knows to call the clinic with any problems questions or concerns.  cc [name], FNP  Follow-up  Taxol and trastuzumab.  1 Barnes   [name], MD, PhD 06/30/2021

## 2021-06-30 NOTE — Progress Notes (Signed)
Last Kanjinti given 12/7 (weekly dosing), asked MD if wanted to reload today.  Per MD no reload resume 2mg /kg today.

## 2021-06-30 NOTE — Progress Notes (Signed)
Pt wondering if watery eyes and runny nose could be caused by allergic rxn from injection received last week. Pt reports nausea/diarrhea resolved by medications

## 2021-07-02 ENCOUNTER — Encounter: Payer: Self-pay | Admitting: Emergency Medicine

## 2021-07-02 ENCOUNTER — Emergency Department: Payer: Medicare Other

## 2021-07-02 ENCOUNTER — Other Ambulatory Visit: Payer: Self-pay

## 2021-07-02 ENCOUNTER — Telehealth: Payer: Self-pay | Admitting: *Deleted

## 2021-07-02 ENCOUNTER — Inpatient Hospital Stay
Admission: EM | Admit: 2021-07-02 | Discharge: 2021-07-07 | DRG: 690 | Disposition: A | Discharge status: Home / Self Care | Payer: Medicare Other | Attending: Family Medicine | Admitting: Family Medicine

## 2021-07-02 DIAGNOSIS — G2581 Restless legs syndrome: Secondary | ICD-10-CM | POA: Diagnosis not present

## 2021-07-02 DIAGNOSIS — M1712 Unilateral primary osteoarthritis, left knee: Secondary | ICD-10-CM | POA: Diagnosis present

## 2021-07-02 DIAGNOSIS — R0789 Other chest pain: Secondary | ICD-10-CM

## 2021-07-02 DIAGNOSIS — Z96641 Presence of right artificial hip joint: Secondary | ICD-10-CM | POA: Diagnosis present

## 2021-07-02 DIAGNOSIS — Z171 Estrogen receptor negative status [ER-]: Secondary | ICD-10-CM

## 2021-07-02 DIAGNOSIS — R079 Chest pain, unspecified: Secondary | ICD-10-CM | POA: Diagnosis not present

## 2021-07-02 DIAGNOSIS — F32A Depression, unspecified: Secondary | ICD-10-CM | POA: Diagnosis not present

## 2021-07-02 DIAGNOSIS — N12 Tubulo-interstitial nephritis, not specified as acute or chronic: Secondary | ICD-10-CM | POA: Diagnosis present

## 2021-07-02 DIAGNOSIS — Z83438 Family history of other disorder of lipoprotein metabolism and other lipidemia: Secondary | ICD-10-CM

## 2021-07-02 DIAGNOSIS — I4891 Unspecified atrial fibrillation: Secondary | ICD-10-CM | POA: Diagnosis not present

## 2021-07-02 DIAGNOSIS — J449 Chronic obstructive pulmonary disease, unspecified: Secondary | ICD-10-CM | POA: Diagnosis not present

## 2021-07-02 DIAGNOSIS — I480 Paroxysmal atrial fibrillation: Secondary | ICD-10-CM | POA: Diagnosis not present

## 2021-07-02 DIAGNOSIS — N10 Acute tubulo-interstitial nephritis: Secondary | ICD-10-CM | POA: Diagnosis not present

## 2021-07-02 DIAGNOSIS — E785 Hyperlipidemia, unspecified: Secondary | ICD-10-CM | POA: Diagnosis not present

## 2021-07-02 DIAGNOSIS — M8580 Other specified disorders of bone density and structure, unspecified site: Secondary | ICD-10-CM | POA: Diagnosis present

## 2021-07-02 DIAGNOSIS — F419 Anxiety disorder, unspecified: Secondary | ICD-10-CM | POA: Diagnosis present

## 2021-07-02 DIAGNOSIS — R109 Unspecified abdominal pain: Secondary | ICD-10-CM | POA: Diagnosis not present

## 2021-07-02 DIAGNOSIS — K319 Disease of stomach and duodenum, unspecified: Secondary | ICD-10-CM | POA: Diagnosis present

## 2021-07-02 DIAGNOSIS — R188 Other ascites: Secondary | ICD-10-CM | POA: Diagnosis not present

## 2021-07-02 DIAGNOSIS — E559 Vitamin D deficiency, unspecified: Secondary | ICD-10-CM | POA: Diagnosis present

## 2021-07-02 DIAGNOSIS — M545 Low back pain, unspecified: Secondary | ICD-10-CM | POA: Diagnosis present

## 2021-07-02 DIAGNOSIS — G43909 Migraine, unspecified, not intractable, without status migrainosus: Secondary | ICD-10-CM | POA: Diagnosis present

## 2021-07-02 DIAGNOSIS — N1330 Unspecified hydronephrosis: Secondary | ICD-10-CM | POA: Diagnosis not present

## 2021-07-02 DIAGNOSIS — Z79899 Other long term (current) drug therapy: Secondary | ICD-10-CM

## 2021-07-02 DIAGNOSIS — Z20822 Contact with and (suspected) exposure to covid-19: Secondary | ICD-10-CM | POA: Diagnosis not present

## 2021-07-02 DIAGNOSIS — Z8249 Family history of ischemic heart disease and other diseases of the circulatory system: Secondary | ICD-10-CM | POA: Diagnosis not present

## 2021-07-02 DIAGNOSIS — Z885 Allergy status to narcotic agent status: Secondary | ICD-10-CM

## 2021-07-02 DIAGNOSIS — Z91040 Latex allergy status: Secondary | ICD-10-CM

## 2021-07-02 DIAGNOSIS — Z803 Family history of malignant neoplasm of breast: Secondary | ICD-10-CM | POA: Diagnosis not present

## 2021-07-02 DIAGNOSIS — N200 Calculus of kidney: Secondary | ICD-10-CM | POA: Diagnosis not present

## 2021-07-02 DIAGNOSIS — Z888 Allergy status to other drugs, medicaments and biological substances status: Secondary | ICD-10-CM

## 2021-07-02 DIAGNOSIS — N189 Chronic kidney disease, unspecified: Secondary | ICD-10-CM | POA: Diagnosis not present

## 2021-07-02 DIAGNOSIS — Z9109 Other allergy status, other than to drugs and biological substances: Secondary | ICD-10-CM

## 2021-07-02 DIAGNOSIS — N136 Pyonephrosis: Secondary | ICD-10-CM | POA: Diagnosis not present

## 2021-07-02 DIAGNOSIS — I130 Hypertensive heart and chronic kidney disease with heart failure and stage 1 through stage 4 chronic kidney disease, or unspecified chronic kidney disease: Secondary | ICD-10-CM | POA: Diagnosis present

## 2021-07-02 DIAGNOSIS — Z818 Family history of other mental and behavioral disorders: Secondary | ICD-10-CM

## 2021-07-02 DIAGNOSIS — G8929 Other chronic pain: Secondary | ICD-10-CM | POA: Diagnosis present

## 2021-07-02 DIAGNOSIS — Z7901 Long term (current) use of anticoagulants: Secondary | ICD-10-CM

## 2021-07-02 DIAGNOSIS — C50411 Malignant neoplasm of upper-outer quadrant of right female breast: Secondary | ICD-10-CM | POA: Diagnosis not present

## 2021-07-02 DIAGNOSIS — E890 Postprocedural hypothyroidism: Secondary | ICD-10-CM | POA: Diagnosis present

## 2021-07-02 DIAGNOSIS — Z90710 Acquired absence of both cervix and uterus: Secondary | ICD-10-CM

## 2021-07-02 DIAGNOSIS — K219 Gastro-esophageal reflux disease without esophagitis: Secondary | ICD-10-CM | POA: Diagnosis present

## 2021-07-02 DIAGNOSIS — Z8711 Personal history of peptic ulcer disease: Secondary | ICD-10-CM

## 2021-07-02 DIAGNOSIS — R1013 Epigastric pain: Secondary | ICD-10-CM | POA: Diagnosis not present

## 2021-07-02 DIAGNOSIS — R310 Gross hematuria: Secondary | ICD-10-CM | POA: Diagnosis not present

## 2021-07-02 DIAGNOSIS — G4733 Obstructive sleep apnea (adult) (pediatric): Secondary | ICD-10-CM | POA: Diagnosis not present

## 2021-07-02 DIAGNOSIS — I509 Heart failure, unspecified: Secondary | ICD-10-CM | POA: Diagnosis present

## 2021-07-02 DIAGNOSIS — I700 Atherosclerosis of aorta: Secondary | ICD-10-CM | POA: Diagnosis not present

## 2021-07-02 DIAGNOSIS — Z87891 Personal history of nicotine dependence: Secondary | ICD-10-CM

## 2021-07-02 LAB — URINALYSIS, COMPLETE (UACMP) WITH MICROSCOPIC
Bacteria, UA: NONE SEEN
Bilirubin Urine: NEGATIVE
Glucose, UA: NEGATIVE mg/dL
Ketones, ur: NEGATIVE mg/dL
Nitrite: NEGATIVE
Protein, ur: 30 mg/dL — AB
RBC / HPF: >50 RBC/hpf — ABNORMAL HIGH (ref 0–5)
Specific Gravity, Urine: 1.015 (ref 1.005–1.030)
Squamous Epithelial / HPF: NONE SEEN (ref 0–5)
WBC, UA: >50 WBC/hpf — ABNORMAL HIGH (ref 0–5)
pH: 7 (ref 5.0–8.0)

## 2021-07-02 LAB — CBC WITH DIFFERENTIAL/PLATELET
Abs Immature Granulocytes: 0.16 10*3/uL — ABNORMAL HIGH (ref 0.00–0.07)
Basophils Absolute: 0 10*3/uL (ref 0.0–0.1)
Basophils Relative: 0 %
Eosinophils Absolute: 0 10*3/uL (ref 0.0–0.5)
Eosinophils Relative: 0 %
HCT: 33.3 % — ABNORMAL LOW (ref 36.0–46.0)
Hemoglobin: 11.4 g/dL — ABNORMAL LOW (ref 12.0–15.0)
Immature Granulocytes: 2 %
Lymphocytes Relative: 17 %
Lymphs Abs: 1.7 10*3/uL (ref 0.7–4.0)
MCH: 31.5 pg (ref 26.0–34.0)
MCHC: 34.2 g/dL (ref 30.0–36.0)
MCV: 92 fL (ref 80.0–100.0)
Monocytes Absolute: 0.3 10*3/uL (ref 0.1–1.0)
Monocytes Relative: 3 %
Neutro Abs: 7.9 10*3/uL — ABNORMAL HIGH (ref 1.7–7.7)
Neutrophils Relative %: 78 %
Platelets: 148 10*3/uL — ABNORMAL LOW (ref 150–400)
RBC: 3.62 MIL/uL — ABNORMAL LOW (ref 3.87–5.11)
RDW: 14.4 % (ref 11.5–15.5)
WBC: 10.1 10*3/uL (ref 4.0–10.5)
nRBC: 0.5 % — ABNORMAL HIGH (ref 0.0–0.2)

## 2021-07-02 LAB — HEPATIC FUNCTION PANEL
ALT: 15 U/L (ref 0–44)
AST: 20 U/L (ref 15–41)
Albumin: 3.8 g/dL (ref 3.5–5.0)
Alkaline Phosphatase: 59 U/L (ref 38–126)
Bilirubin, Direct: 0.1 mg/dL (ref 0.0–0.2)
Indirect Bilirubin: 0.7 mg/dL (ref 0.3–0.9)
Total Bilirubin: 0.8 mg/dL (ref 0.3–1.2)
Total Protein: 6.8 g/dL (ref 6.5–8.1)

## 2021-07-02 LAB — BASIC METABOLIC PANEL
Anion gap: 5 (ref 5–15)
BUN: 19 mg/dL (ref 8–23)
CO2: 24 mmol/L (ref 22–32)
Calcium: 8.7 mg/dL — ABNORMAL LOW (ref 8.9–10.3)
Chloride: 108 mmol/L (ref 98–111)
Creatinine, Ser: 0.77 mg/dL (ref 0.44–1.00)
GFR, Estimated: >60 mL/min (ref >60)
Glucose, Bld: 104 mg/dL — ABNORMAL HIGH (ref 70–99)
Potassium: 3.7 mmol/L (ref 3.5–5.1)
Sodium: 137 mmol/L (ref 135–145)

## 2021-07-02 LAB — CBC
HCT: 33.9 % — ABNORMAL LOW (ref 36.0–46.0)
Hemoglobin: 11.3 g/dL — ABNORMAL LOW (ref 12.0–15.0)
MCH: 30.4 pg (ref 26.0–34.0)
MCHC: 33.3 g/dL (ref 30.0–36.0)
MCV: 91.1 fL (ref 80.0–100.0)
Platelets: 148 10*3/uL — ABNORMAL LOW (ref 150–400)
RBC: 3.72 MIL/uL — ABNORMAL LOW (ref 3.87–5.11)
RDW: 14.4 % (ref 11.5–15.5)
WBC: 10.1 10*3/uL (ref 4.0–10.5)
nRBC: 0.4 % — ABNORMAL HIGH (ref 0.0–0.2)

## 2021-07-02 LAB — TROPONIN I (HIGH SENSITIVITY)
Troponin I (High Sensitivity): 4 ng/L (ref <18)
Troponin I (High Sensitivity): 5 ng/L (ref <18)

## 2021-07-02 LAB — RESP PANEL BY RT-PCR (FLU A&B, COVID) ARPGX2
Influenza A by PCR: NEGATIVE
Influenza B by PCR: NEGATIVE
SARS Coronavirus 2 by RT PCR: NEGATIVE

## 2021-07-02 LAB — TSH: TSH: 2.06 u[IU]/mL (ref 0.350–4.500)

## 2021-07-02 LAB — LIPASE, BLOOD: Lipase: 24 U/L (ref 11–51)

## 2021-07-02 MED ORDER — SODIUM CHLORIDE 0.9 % IV SOLN: 1.0000 g | INTRAVENOUS | Status: DC

## 2021-07-02 MED ORDER — ROPINIROLE HCL 1 MG PO TABS
2.0000 mg | ORAL_TABLET | Freq: Every day | ORAL | Status: DC
  Administered 2021-07-02 – 2021-07-06 (×5): 2 mg via ORAL
  Filled 2021-07-02 (×5): qty 2

## 2021-07-02 MED ORDER — ONDANSETRON HCL 4 MG PO TABS: 8.0000 mg | ORAL_TABLET | Freq: Three times a day (TID) | ORAL | Status: DC | PRN

## 2021-07-02 MED ORDER — ALPRAZOLAM 0.25 MG PO TABS
0.2500 mg | ORAL_TABLET | Freq: Every evening | ORAL | Status: DC | PRN
  Administered 2021-07-02 – 2021-07-06 (×5): 0.25 mg via ORAL
  Filled 2021-07-02 (×5): qty 1

## 2021-07-02 MED ORDER — MAGIC MOUTHWASH
5.0000 mL | Freq: Two times a day (BID) | ORAL | Status: DC
  Administered 2021-07-02 – 2021-07-07 (×4): 5 mL via ORAL
  Filled 2021-07-02: qty 5
  Filled 2021-07-02 (×5): qty 10
  Filled 2021-07-02: qty 5
  Filled 2021-07-02 (×7): qty 10
  Filled 2021-07-02: qty 5

## 2021-07-02 MED ORDER — APIXABAN 5 MG PO TABS
5.0000 mg | ORAL_TABLET | Freq: Two times a day (BID) | ORAL | Status: DC
  Administered 2021-07-02 – 2021-07-05 (×6): 5 mg via ORAL
  Filled 2021-07-02 (×6): qty 1

## 2021-07-02 MED ORDER — ROSUVASTATIN CALCIUM 10 MG PO TABS
20.0000 mg | ORAL_TABLET | Freq: Every day | ORAL | Status: DC
  Administered 2021-07-02 – 2021-07-06 (×5): 20 mg via ORAL
  Filled 2021-07-02: qty 1
  Filled 2021-07-02 (×2): qty 2
  Filled 2021-07-02: qty 1
  Filled 2021-07-02: qty 2

## 2021-07-02 MED ORDER — PROCHLORPERAZINE MALEATE 10 MG PO TABS
10.0000 mg | ORAL_TABLET | Freq: Four times a day (QID) | ORAL | Status: DC | PRN
  Filled 2021-07-02: qty 1

## 2021-07-02 MED ORDER — LOPERAMIDE HCL 2 MG PO CAPS: 2.0000 mg | ORAL_CAPSULE | ORAL | Status: DC | PRN

## 2021-07-02 MED ORDER — DILTIAZEM HCL-DEXTROSE 125-5 MG/125ML-% IV SOLN (PREMIX)
5.0000 mg/h | INTRAVENOUS | Status: DC
  Administered 2021-07-02 – 2021-07-03 (×2): 5 mg/h via INTRAVENOUS
  Filled 2021-07-02 (×3): qty 125

## 2021-07-02 MED ORDER — ACETAMINOPHEN 325 MG PO TABS
650.0000 mg | ORAL_TABLET | Freq: Four times a day (QID) | ORAL | Status: DC | PRN
  Administered 2021-07-02: 22:00:00 650 mg via ORAL
  Filled 2021-07-02: qty 2

## 2021-07-02 MED ORDER — PANTOPRAZOLE SODIUM 40 MG PO TBEC: 40.0000 mg | DELAYED_RELEASE_TABLET | Freq: Every day | ORAL | Status: DC

## 2021-07-02 MED ORDER — BUSPIRONE HCL 5 MG PO TABS
5.0000 mg | ORAL_TABLET | Freq: Three times a day (TID) | ORAL | Status: DC
  Administered 2021-07-02 – 2021-07-07 (×15): 5 mg via ORAL
  Filled 2021-07-02 (×17): qty 1

## 2021-07-02 MED ORDER — MORPHINE SULFATE (PF) 2 MG/ML IV SOLN
2.0000 mg | Freq: Once | INTRAVENOUS | Status: AC
  Administered 2021-07-02: 15:00:00 2 mg via INTRAVENOUS
  Filled 2021-07-02: qty 1

## 2021-07-02 MED ORDER — ALBUTEROL SULFATE HFA 108 (90 BASE) MCG/ACT IN AERS: 2.0000 | INHALATION_SPRAY | Freq: Three times a day (TID) | RESPIRATORY_TRACT | Status: DC | PRN

## 2021-07-02 MED ORDER — METOPROLOL TARTRATE 5 MG/5ML IV SOLN
5.0000 mg | INTRAVENOUS | Status: DC | PRN
  Administered 2021-07-02: 15:00:00 5 mg via INTRAVENOUS
  Filled 2021-07-02: qty 5

## 2021-07-02 MED ORDER — DULOXETINE HCL 30 MG PO CPEP
30.0000 mg | ORAL_CAPSULE | Freq: Two times a day (BID) | ORAL | Status: DC
  Administered 2021-07-02 – 2021-07-07 (×10): 30 mg via ORAL
  Filled 2021-07-02 (×11): qty 1

## 2021-07-02 MED ORDER — SODIUM CHLORIDE 0.9 % IV SOLN
1.0000 g | Freq: Once | INTRAVENOUS | Status: DC
  Filled 2021-07-02: qty 10

## 2021-07-02 MED ORDER — PROMETHAZINE HCL 25 MG PO TABS
25.0000 mg | ORAL_TABLET | Freq: Four times a day (QID) | ORAL | Status: DC | PRN
  Filled 2021-07-02: qty 1

## 2021-07-02 MED ORDER — ACETAMINOPHEN 650 MG RE SUPP
650.0000 mg | Freq: Four times a day (QID) | RECTAL | Status: DC | PRN
  Filled 2021-07-02: qty 1

## 2021-07-02 MED ORDER — ALBUTEROL SULFATE (2.5 MG/3ML) 0.083% IN NEBU: 2.5000 mg | INHALATION_SOLUTION | Freq: Three times a day (TID) | RESPIRATORY_TRACT | Status: DC | PRN

## 2021-07-02 MED ORDER — SODIUM CHLORIDE 0.9 % IV SOLN
1.0000 g | INTRAVENOUS | Status: DC
  Administered 2021-07-02 – 2021-07-04 (×3): 1 g via INTRAVENOUS
  Filled 2021-07-02: qty 10
  Filled 2021-07-02: qty 1
  Filled 2021-07-02 (×2): qty 10

## 2021-07-02 MED ORDER — AZELASTINE HCL 0.1 % NA SOLN
1.0000 | NASAL | Status: DC | PRN
  Filled 2021-07-02: qty 30

## 2021-07-02 MED ORDER — IOHEXOL 350 MG/ML SOLN
100.0000 mL | Freq: Once | INTRAVENOUS | Status: AC | PRN
  Administered 2021-07-02: 16:00:00 100 mL via INTRAVENOUS

## 2021-07-02 NOTE — ED Triage Notes (Signed)
Pt comes into the ED via POV c/o upper abd pain and chest pain that started this morning.  Pt states she had last chemo on Wednesday.  Pt denies having anything on her stomach, but still had nausea this morning when she woke up.  Pt states the pain radiated down both arms as well.  Pt admits to a little SHOB and dizziness as well.  Pt currently has even and unlabored respirations.

## 2021-07-02 NOTE — Telephone Encounter (Signed)
Received a call from patient husband reporting that patient is vomiting, having abdominal cramps and pain across her chest and into her arms. I asked when this started and she answered from background that it started at 8 AM this morning. Since patient has not had any recent filgrastim I recommended that he take patient to ER due to chest pain into her arms. He agreed to take her to ER

## 2021-07-02 NOTE — ED Provider Notes (Signed)
° °[facility] °Emergency Department Provider Note ° ° °____________________________________________ ° ° Event Date/Time  ° First MD Initiated Contact with Patient 07/02/21 1352   °  (approximate) ° °I have reviewed the triage vital signs and the nursing notes. ° ° °HISTORY ° °Chief Complaint °Abdominal Pain and Chest Pain ° ° ° °HPI °Amber Barnes is a 70 y.o. female with past medical history of hypertension, CKD, atrial fibrillation on Eliquis, COPD, and breast cancer who presents to the ED complaining of abdominal pain and chest pain.  Patient reports that around 8 AM this morning, shortly after she had woken up, she developed pain in her epigastrium moving up into her chest and both arms.  She describes the pain as sharp and severe, not exacerbated or alleviated by anything.  It is been associated with nausea and a couple episodes of vomiting, nausea has since improved but pain persists.  She denies any associated diarrhea, fevers, dysuria, or flank pain.  She denies any history of similar symptoms, denies any cardiac history.  She has not missed any doses of her anticoagulation recently. °   ° °  ° °Past Medical History:  °Diagnosis Date  ° Achilles tendinitis   ° Acute medial meniscal tear   ° Allergy   ° Anxiety   ° Atrial fibrillation (HCC)   ° a. new onset 07/2016; b. CHADS2VASc --> 4 (HTN, female, age, CHF); c. eliquis  ° CHF (congestive heart failure) (HCC)   ° Chronic anticoagulation   ° Apixaban  ° Chronic lower back pain   ° Colon polyp   ° Complication of anesthesia   ° Emergence delirium; hypoxia  ° COPD (chronic obstructive pulmonary disease) (HCC)   ° Cystic breast   ° Depression   ° Endometriosis   ° Fibrocystic breast disease   ° GERD (gastroesophageal reflux disease)   ° History of stress test   ° a. 07/2016 MV: EF 55-65%. Small, mild defect  in mid anteroseptal, apical septal, and apical locations. No ischemia-->low risk.  ° Hyperlipidemia   ° Hypertension   ° Insomnia    ° Lipoma   ° Malignant neoplasm of upper-outer quadrant of right female breast (HCC)   ° Stage 1A invasive mammary carcinoma (cT1b or T1c N0); ER (-), PR (+), HER2/neu (+)  ° Migraine   ° Mild Mitral regurgitation   ° a. 07/2016 Echo: EF 55-65%, no rwma, mild MR. Mildly dil LA. Nl RV fx.   ° OSA (obstructive sleep apnea)   ° non-compliant with nocturnal PAP therapy  ° Osteoarthritis   ° left knee  ° Osteopenia   ° Peptic ulcer disease   ° a. H. pylori  ° Pneumonia   ° RLS (restless legs syndrome)   ° Tendonitis   ° Thyroid disease   ° Vitamin D deficiency   ° ° °Patient Active Problem List  ° Diagnosis Date Noted  ° Drug-induced neutropenia (HCC) 06/23/2021  ° CKD (chronic kidney disease) 06/21/2021  ° Chemotherapy induced diarrhea 06/09/2021  ° Anemia due to antineoplastic chemotherapy 05/18/2021  ° Chemotherapy induced nausea and vomiting 05/18/2021  ° COVID-19 04/30/2021  ° Neutropenia (HCC)   ° Anxiety   ° Intractable vomiting   ° Diarrhea   ° Neutropenic fever (HCC) 04/05/2021  ° Encounter for antineoplastic chemotherapy 03/24/2021  ° Neuropathy 03/24/2021  ° Port-A-Cath in place 03/24/2021  ° HER2-positive carcinoma of breast (HCC) 01/26/2021  ° Goals of care, counseling/discussion 01/26/2021  ° Malignant neoplasm of upper-outer quadrant   quadrant of right female breast ([place]) 01/19/2021   Left arm pain 12/02/2020   Soft tissue mass 12/02/2020   Candidal intertrigo 09/09/2020   Urinary frequency 09/09/2020   Night sweat 03/31/2020   Nasal drainage 12/18/2019   Sinus congestion 12/18/2019   Nausea 10/30/2019   Major depression in remission ([place]) 08/27/2018   Primary osteoarthritis involving multiple joints 08/27/2018   Atrial fibrillation ([place]) 08/27/2018   COPD (chronic obstructive pulmonary disease) ([place]) 08/27/2018   BMI 50.0-59.9, adult ([place]) 07/16/2018   Morbid obesity with BMI of 50.0-59.9, adult ([place]) 05/31/2018   Bradycardia 05/22/2017   Restless leg syndrome 04/13/2017   OSA (obstructive sleep  apnea) 01/09/2017   Fatty liver disease, nonalcoholic 62/09/5595   Family history of ovarian cancer 06/08/2016   GERD (gastroesophageal reflux disease) 05/05/2016   Hypertension 11/11/2014   Anxiety and depression 11/11/2014   Weight loss 11/11/2014    Past Surgical History:  Procedure Laterality Date   ABDOMINAL HYSTERECTOMY     ovaries left   ACHILLES TENDON SURGERY     2012,01/2017   BACK SURGERY     fusion lumbar   BREAST BIOPSY Right 01/15/2021   Affirm biopsy-"X" clip-INVASIVE MAMMARY CARCINOMA   BREAST BIOPSY Right 01/15/2021   u/s bx-"venus" clip INVASIVE MAMMARY CARCINOMA   BREAST BIOPSY     BREAST LUMPECTOMY,RADIO FREQ LOCALIZER,AXILLARY SENTINEL LYMPH NODE BIOPSY Right 02/17/2021   Procedure: BREAST LUMPECTOMY,RADIO FREQ LOCALIZER,AXILLARY SENTINEL LYMPH NODE BIOPSY;  Surgeon: Ronny Bacon, MD;  Location: [facility];  Service: General;  Laterality: Right;   CARDIOVERSION N/A 09/07/2016   Procedure: CARDIOVERSION;  Surgeon: Minna Merritts, MD;  Location: [facility];  Service: Cardiovascular;  Laterality: N/A;   CARDIOVERSION N/A 10/19/2016   Procedure: CARDIOVERSION;  Surgeon: Minna Merritts, MD;  Location: [facility];  Service: Cardiovascular;  Laterality: N/A;   CARDIOVERSION N/A 12/14/2016   Procedure: CARDIOVERSION;  Surgeon: Minna Merritts, MD;  Location: [facility];  Service: Cardiovascular;  Laterality: N/A;   COLONOSCOPY     COLONOSCOPY WITH PROPOFOL N/A 05/13/2020   Procedure: COLONOSCOPY WITH PROPOFOL;  Surgeon: Toledo, Benay Pike, MD;  Location: [facility];  Service: Gastroenterology;  Laterality: N/A;  ELIQUIS   ESOPHAGOGASTRODUODENOSCOPY     ESOPHAGOGASTRODUODENOSCOPY (EGD) WITH PROPOFOL N/A 05/13/2020   Procedure: ESOPHAGOGASTRODUODENOSCOPY (EGD) WITH PROPOFOL;  Surgeon: Toledo, Benay Pike, MD;  Location: [facility];  Service: Gastroenterology;  Laterality: N/A;   FOOT SURGERY Left    tendon   JOINT REPLACEMENT Right    hip   PORTA CATH  INSERTION N/A 03/23/2021   Procedure: PORTA CATH INSERTION;  Surgeon: Katha Cabal, MD;  Location: [facility];  Service: Cardiovascular;  Laterality: N/A;   PORTACATH PLACEMENT N/A 03/23/2021   Procedure: ATTEMPTED INSERTION PORT-A-CATH;  Surgeon: Ronny Bacon, MD;  Location: [facility];  Service: General;  Laterality: N/A;   THYROIDECTOMY     thyroid lobectomy secondary to a benign nodule   TOTAL HIP ARTHROPLASTY Right 08/21/2018   Procedure: TOTAL HIP ARTHROPLASTY ANTERIOR APPROACH-RIGHT CELL SAVER REQUESTED;  Surgeon: [name], MD;  Location: [facility];  Service: Orthopedics;  Laterality: Right;    Prior to Admission medications   Medication Sig Start Date End Date Taking? Authorizing Provider  acetaminophen (TYLENOL) 500 MG tablet Take 500 mg by mouth every 6 (six) hours as needed. Taking as needed    [provider]  acetaminophen-codeine (TYLENOL #3) 300-30 MG tablet Take 1 tablet by mouth every 6 (six) hours as needed for moderate pain or  pain. 06/16/21   Yu, Zhou, MD  °albuterol (VENTOLIN HFA) 108 (90 Base) MCG/ACT inhaler INHALE 2 PUFFS INTO THE LUNGS EVERY 8 (EIGHT) HOURS AS NEEDED FOR WHEEZING OR SHORTNESS OF BREATH. 04/04/19   Arnett, Margaret G, FNP  °ALPRAZolam (XANAX) 0.25 MG tablet Take 0.25 mg by mouth at bedtime as needed for anxiety.    [provider]  °azelastine (ASTELIN) 0.1 % nasal spray Place into both nostrils as needed for rhinitis. Use in each nostril as directed    [provider]  °B Complex Vitamins (VITAMIN B COMPLEX PO) Take 1 tablet by mouth daily.     [provider]  °bisoprolol (ZEBETA) 5 MG tablet Take 1 tablet (5 mg total) by mouth 2 (two) times daily. 04/09/21 06/09/21  Lai, Tina, MD  °busPIRone (BUSPAR) 5 MG tablet TAKE 1 TABLET BY MOUTH THREE TIMES A DAY AS NEEDED 04/12/21   Arnett, Margaret G, FNP  °Cholecalciferol (VITAMIN D3) 5000 units CAPS Take 5,000 Units by mouth daily.    [provider]   °DULoxetine (CYMBALTA) 30 MG capsule Take 1 capsule (30 mg total) by mouth 2 (two) times daily. Home med. 04/09/21   Lai, Tina, MD  °ELIQUIS 5 MG TABS tablet TAKE 1 TABLET BY MOUTH TWICE A DAY 11/25/20   Gollan, Timothy J, MD  °esomeprazole (NEXIUM) 20 MG capsule Take 20 mg by mouth as needed.    [provider]  °furosemide (LASIX) 20 MG tablet Take 1 tablet (20 mg total) by mouth daily. 06/21/21   Arnett, Margaret G, FNP  °lidocaine-prilocaine (EMLA) cream Apply to affected area once 03/19/21   Yu, Zhou, MD  °loperamide (IMODIUM) 2 MG capsule Take 1 capsule (2 mg total) by mouth See admin instructions. Initial: 4 mg, followed by 2 mg after each loose stool; maximum: 16 mg/day 05/25/21   Yu, Zhou, MD  °magic mouthwash SOLN Take 5 mLs by mouth 2 (two) times daily. 06/23/21   Yu, Zhou, MD  °Multiple Vitamin (MULTIVITAMIN) tablet Take 1 tablet by mouth daily.    [provider]  °nystatin (MYCOSTATIN/NYSTOP) powder Apply 1 application topically 3 (three) times daily. 05/11/21   Yu, Zhou, MD  °ondansetron (ZOFRAN) 8 MG tablet Take 1 tablet (8 mg total) by mouth every 8 (eight) hours as needed for nausea or vomiting. 03/31/21   Yu, Zhou, MD  °potassium chloride SA (KLOR-CON) 20 MEQ tablet Take 1 tablet (20 mEq total) by mouth 2 (two) times daily. 03/03/21   Arnett, Margaret G, FNP  °prochlorperazine (COMPAZINE) 10 MG tablet Take 1 tablet (10 mg total) by mouth every 6 (six) hours as needed (Nausea or vomiting). 03/19/21   Yu, Zhou, MD  °promethazine (PHENERGAN) 25 MG tablet Take 1 tablet (25 mg total) by mouth every 6 (six) hours as needed for nausea or vomiting. 04/02/21   Yu, Zhou, MD  °ranolazine (RANEXA) 500 MG 12 hr tablet TAKE 1 TABLET BY MOUTH TWICE A DAY 03/25/21   Gollan, Timothy J, MD  °rizatriptan (MAXALT) 10 MG tablet TAKE 1 TABLET (10 MG TOTAL) BY MOUTH AS NEEDED FOR MIGRAINE. MAY REPEAT IN 2 HOURS IF NEEDED 03/13/18   Ahern, Antonia B, MD  °rOPINIRole (REQUIP) 2 MG tablet Take 1 tablet (2 mg total)  by mouth at bedtime. 06/21/21   Arnett, Margaret G, FNP  °rosuvastatin (CRESTOR) 20 MG tablet TAKE 1 TABLET BY MOUTH EVERY DAY 03/25/21   Arnett, Margaret G, FNP  ° ° °Allergies °Prednisone, Amiodarone, Hydrocodone-acetaminophen, Oxycodone, Tapentadol, Tramadol, Adhesive [  tape], and Latex ° °Family History  °Problem Relation Age of Onset  ° Cancer Sister 54  °     ovarian  ° Depression Sister   ° Cancer Brother   °     CLL  ° Cancer Father 56  °     colon  ° Heart disease Father   ° Heart attack Father 60  °     died from MI  ° Hyperlipidemia Father   ° Hypertension Father   ° Cancer Mother   °     leukemia  ° Breast cancer Paternal Aunt   °     40's  ° Migraines Neg Hx   ° ° °Social History °Social History  ° °Tobacco Use  ° Smoking status: Former  °  Packs/day: 0.25  °  Years: 10.00  °  Pack years: 2.50  °  Types: Cigarettes  °  Quit date: 2005  °  Years since quitting: 17.9  ° Smokeless tobacco: Never  ° Tobacco comments:  °  smoked for 10 years, 1 pack every 2-3 days  °Vaping Use  ° Vaping Use: Never used  °Substance Use Topics  ° Alcohol use: Not Currently  °  Alcohol/week: 1.0 standard drink  °  Types: 1 Glasses of wine per week  °  Comment: rarely  ° Drug use: No  ° ° °Review of Systems ° °Constitutional: No fever/chills °Eyes: No visual changes. °ENT: No sore throat. °Cardiovascular: Positive for chest pain. °Respiratory: Denies shortness of breath. °Gastrointestinal: Positive for abdominal pain, nausea, and vomiting.  No diarrhea.  No constipation. °Genitourinary: Negative for dysuria. °Musculoskeletal: Negative for back pain. °Skin: Negative for rash. °Neurological: Negative for headaches, focal weakness or numbness. ° °____________________________________________ ° ° °PHYSICAL EXAM: ° °VITAL SIGNS: °ED Triage Vitals  °Enc Vitals Group  °   BP 07/02/21 1252 123/88  °   Pulse Rate 07/02/21 1252 81  °   Resp 07/02/21 1252 17  °   Temp 07/02/21 1252 97.8 °F (36.6 °C)  °   Temp Source 07/02/21 1252 Oral  °    SpO2 07/02/21 1252 96 %  °   Weight 07/02/21 1253 263 lb 8 oz (119.5 kg)  °   Height 07/02/21 1253 5' 5" (1.651 m)  °   Head Circumference --   °   Peak Flow --   °   Pain Score 07/02/21 1253 8  °   Pain Loc --   °   Pain Edu? --   °   Excl. in GC? --   ° ° °Constitutional: Alert and oriented. °Eyes: Conjunctivae are normal. °Head: Atraumatic. °Nose: No congestion/rhinnorhea. °Mouth/Throat: Mucous membranes are moist. °Neck: Normal ROM °Cardiovascular: Tachycardic, irregularly irregular rhythm. Grossly normal heart sounds.  2+ radial pulses bilaterally. °Respiratory: Normal respiratory effort.  No retractions. Lungs CTAB. °Gastrointestinal: Soft and tender to palpation in the bilateral upper quadrants with no rebound or guarding. No distention. °Genitourinary: deferred °Musculoskeletal: No lower extremity tenderness nor edema. °Neurologic:  Normal speech and language. No gross focal neurologic deficits are appreciated. °Skin:  Skin is warm, dry and intact. No rash noted. °Psychiatric: Mood and affect are normal. Speech and behavior are normal. ° °____________________________________________ °  °LABS °(all labs ordered are listed, but only abnormal results are displayed) ° °Labs Reviewed  °BASIC METABOLIC PANEL - Abnormal; Notable for the following components:  °    Result Value  ° Glucose, Bld 104 (*)   ° Calcium 8.7 (*)   °   All other components within normal limits  CBC - Abnormal; Notable for the following components:   RBC 3.72 (*)    Hemoglobin 11.3 (*)    HCT 33.9 (*)    Platelets 148 (*)    nRBC 0.4 (*)    All other components within normal limits  URINALYSIS, COMPLETE (UACMP) WITH MICROSCOPIC - Abnormal; Notable for the following components:   APPearance HAZY (*)    Hgb urine dipstick LARGE (*)    Protein, ur 30 (*)    Leukocytes,Ua TRACE (*)    RBC / HPF >50 (*)    WBC, UA >50 (*)    All other components within normal limits  RESP PANEL BY RT-PCR (FLU A&B, COVID) [id number]  HEPATIC FUNCTION  PANEL  LIPASE, BLOOD  TROPONIN I (HIGH SENSITIVITY)  TROPONIN I (HIGH SENSITIVITY)   ____________________________________________  EKG  ED ECG REPORT I, Blake Divine, the attending physician, personally viewed and interpreted this ECG.   Date: 07/02/2021  EKG Time: 13:03  Rate: 128  Rhythm: atrial fibrillation  Axis: Normal  Intervals:none  ST&T Change: Nonspecific T wave changes   PROCEDURES  Procedure(s) performed (including Critical Care):  .Critical Care Performed by: Blake Divine, MD Authorized by: Blake Divine, MD   Critical care provider statement:    Critical care time (minutes):  30   Critical care time was exclusive of:  Separately billable procedures and treating other patients and teaching time   Critical care was necessary to treat or prevent imminent or life-threatening deterioration of the following conditions:  Cardiac failure   Critical care was time spent personally by me on the following activities:  Development of treatment plan with patient or surrogate, discussions with consultants, evaluation of patient's response to treatment, examination of patient, ordering and review of laboratory studies, ordering and review of radiographic studies, ordering and performing treatments and interventions, pulse oximetry, re-evaluation of patient's condition and review of old charts   I assumed direction of critical care for this patient from another provider in my specialty: no     ____________________________________________   INITIAL IMPRESSION / ASSESSMENT AND PLAN / ED COURSE      71 year old female with past medical history of hypertension, CKD, atrial fibrillation on Eliquis, COPD, and breast cancer presents to the ED with acute onset of pain starting in her abdomen and and moving up into her chest and both arms.  Patient noted to be in atrial fibrillation with RVR, no significant ischemic changes noted on EKG.  Atrial fibrillation could be  contributing to her symptoms although with reproducible abdominal pain on palpation, we will further assess with CT scan.  Also check CTA of her chest given hypercoagulable state with current chemotherapy.  Initial labs are reassuring and troponin within normal limits, patient is very much interested in outpatient management if possible.  We will attempt to control heart rate with metoprolol, however this if this is unsuccessful we will start patient on diltiazem drip and plan for admission.      ____________________________________________   FINAL CLINICAL IMPRESSION(S) / ED DIAGNOSES  Final diagnoses:  Atrial fibrillation with RVR ([place])  Epigastric pain  Atypical chest pain     ED Discharge Orders     None        Note:  This document was prepared using Dragon voice recognition software and may include unintentional dictation errors.    Blake Divine, MD 07/02/21 [phone]

## 2021-07-02 NOTE — H&P (Addendum)
History and Physical    Hiedi Touchton ULA:[id number] DOB: 05-15-1951 DOA: 07/02/2021 PCP: [name], FNP  Chief Complaint: abdominal/chest pain Historian: patient HPI:  Amber Barnes is a 70 y.o. female with a PMH significant for breast cancer currently receiving chemotherapy, pAfib, anxiety/depression, HTN, OSA, OA, COPD, CKD. They presented from home to the ED on 07/02/2021 with epigastric pain x1 days.  Patient states that she began having epigastric radiating to back pain this morning which gradually worsened throughout the day. She had one episode of emesis after onset but has not had any reoccurrence or nausea since then. She has had fluttering in her chest which is the typical sensation for her when she goes into Afib. She states this is common for her and she has been adherent with anticoagulation. She has not tried anything for it at home and decided to come to the ED after discussing with someone in her oncology office to rule our cardiac cause. She endorses red urine today which she thinks is due to drinking cranberry juice yesterday. Denies frequency, urgency, dysuria.  In the ED, it was found that they had signs of pyelonephritis on CT, urinalysis. She was in Afib with RVR in 110-120s and symptomatic but hemodynamically stable and afebrile. Metoprolol transiently improved her HR but returned to RVR so was started on dilt gtt in ED.  For the pyelonephritis, she was started on CTX.  IV morphine greatly improved her pain.  Patient was admitted to medicine service for further workup and management of Afib RVR and pyelonephritis as outlined in detail below.  Review of Systems: As per HPI otherwise 10 point review of systems negative.   Past Medical History:  Diagnosis Date   Achilles tendinitis    Acute medial meniscal tear    Allergy    Anxiety    Atrial fibrillation ([place])    a. new onset 07/2016; b. CHADS2VASc --> 4 (HTN, female, age, CHF); c. eliquis   CHF  (congestive heart failure) (HCC)    Chronic anticoagulation    Apixaban   Chronic lower back pain    Colon polyp    Complication of anesthesia    Emergence delirium; hypoxia   COPD (chronic obstructive pulmonary disease) ([place])    Cystic breast    Depression    Endometriosis    Fibrocystic breast disease    GERD (gastroesophageal reflux disease)    History of stress test    a. 07/2016 MV: EF 55-65%. Small, mild defect  in mid anteroseptal, apical septal, and apical locations. No ischemia-->low risk.   Hyperlipidemia    Hypertension    Insomnia    Lipoma    Malignant neoplasm of upper-outer quadrant of right female breast (HCC)    Stage 1A invasive mammary carcinoma (cT1b or T1c N0); ER (-), PR (+), HER2/neu (+)   Migraine    Mild Mitral regurgitation    a. 07/2016 Echo: EF 55-65%, no rwma, mild MR. Mildly dil LA. Nl RV fx.    OSA (obstructive sleep apnea)    non-compliant with nocturnal PAP therapy   Osteoarthritis    left knee   Osteopenia    Peptic ulcer disease    a. H. pylori   Pneumonia    RLS (restless legs syndrome)    Tendonitis    Thyroid disease    Vitamin D deficiency     Past Surgical History:  Procedure Laterality Date   ABDOMINAL HYSTERECTOMY     ovaries left   ACHILLES TENDON SURGERY  [place]     fusion lumbar   BREAST BIOPSY Right 01/15/2021   Affirm biopsy-"X" clip-INVASIVE MAMMARY CARCINOMA   BREAST BIOPSY Right 01/15/2021   u/s bx-"venus" clip INVASIVE MAMMARY CARCINOMA   BREAST BIOPSY     BREAST LUMPECTOMY,RADIO FREQ LOCALIZER,AXILLARY SENTINEL LYMPH NODE BIOPSY Right 02/17/2021   Procedure: BREAST LUMPECTOMY,RADIO FREQ LOCALIZER,AXILLARY SENTINEL LYMPH NODE BIOPSY;  Surgeon: Ronny Bacon, MD;  Location: [facility];  Service: General;  Laterality: Right;   CARDIOVERSION N/A 09/07/2016   Procedure: CARDIOVERSION;  Surgeon: Minna Merritts, MD;  Location: [facility];  Service: Cardiovascular;  Laterality: N/A;    CARDIOVERSION N/A 10/19/2016   Procedure: CARDIOVERSION;  Surgeon: Minna Merritts, MD;  Location: [facility];  Service: Cardiovascular;  Laterality: N/A;   CARDIOVERSION N/A 12/14/2016   Procedure: CARDIOVERSION;  Surgeon: Minna Merritts, MD;  Location: [facility];  Service: Cardiovascular;  Laterality: N/A;   COLONOSCOPY     COLONOSCOPY WITH PROPOFOL N/A 05/13/2020   Procedure: COLONOSCOPY WITH PROPOFOL;  Surgeon: Toledo, Benay Pike, MD;  Location: [facility];  Service: Gastroenterology;  Laterality: N/A;  ELIQUIS   ESOPHAGOGASTRODUODENOSCOPY     ESOPHAGOGASTRODUODENOSCOPY (EGD) WITH PROPOFOL N/A 05/13/2020   Procedure: ESOPHAGOGASTRODUODENOSCOPY (EGD) WITH PROPOFOL;  Surgeon: Toledo, Benay Pike, MD;  Location: [facility];  Service: Gastroenterology;  Laterality: N/A;   FOOT SURGERY Left    tendon   JOINT REPLACEMENT Right    hip   PORTA CATH INSERTION N/A 03/23/2021   Procedure: PORTA CATH INSERTION;  Surgeon: Katha Cabal, MD;  Location: [facility];  Service: Cardiovascular;  Laterality: N/A;   PORTACATH PLACEMENT N/A 03/23/2021   Procedure: ATTEMPTED INSERTION PORT-A-CATH;  Surgeon: Ronny Bacon, MD;  Location: [facility];  Service: General;  Laterality: N/A;   THYROIDECTOMY     thyroid lobectomy secondary to a benign nodule   TOTAL HIP ARTHROPLASTY Right 08/21/2018   Procedure: TOTAL HIP ARTHROPLASTY ANTERIOR APPROACH-RIGHT CELL SAVER REQUESTED;  Surgeon: [name], MD;  Location: [facility];  Service: Orthopedics;  Laterality: Right;     reports that she quit smoking about 17 years ago. Her smoking use included cigarettes. She has a 2.50 pack-year smoking history. She has never used smokeless tobacco. She reports that she does not currently use alcohol after a past usage of about 1.0 standard drink per week. She reports that she does not use drugs.  Allergies  Allergen Reactions   Prednisone Other (See Comments)    Agitation, hyperactivity, polyphagia    Amiodarone Nausea And Vomiting   Hydrocodone-Acetaminophen Other (See Comments)    Hallucinations and combative   Oxycodone Itching    Can take it with benadryl   Tapentadol Itching   Tramadol Itching    Can take it with benadryl   Adhesive [Tape] Itching and Rash    Prefers paper tape   Latex Itching and Rash    use paper tap    Family History  Problem Relation Age of Onset   Cancer Sister 53       ovarian   Depression Sister    Cancer Brother        CLL   Cancer Father 40       colon   Heart disease Father    Heart attack Father 61       died from MI   Hyperlipidemia Father    Hypertension Father    Cancer Mother        leukemia   Breast cancer  Paternal Aunt        58's   Migraines Neg Hx     Prior to Admission medications   Medication Sig Start Date End Date Taking? Authorizing Provider  acetaminophen (TYLENOL) 500 MG tablet Take 500 mg by mouth every 6 (six) hours as needed. Taking as needed    [provider]  acetaminophen-codeine (TYLENOL #3) 300-30 MG tablet Take 1 tablet by mouth every 6 (six) hours as needed for moderate pain or severe pain. 06/16/21   [name], MD  albuterol (VENTOLIN HFA) 108 (90 Base) MCG/ACT inhaler INHALE 2 PUFFS INTO THE LUNGS EVERY 8 (EIGHT) HOURS AS NEEDED FOR WHEEZING OR SHORTNESS OF BREATH. 04/04/19   [name], FNP  ALPRAZolam Duanne Moron) 0.25 MG tablet Take 0.25 mg by mouth at bedtime as needed for anxiety.    [provider]  azelastine (ASTELIN) 0.1 % nasal spray Place into both nostrils as needed for rhinitis. Use in each nostril as directed    [provider]  B Complex Vitamins (VITAMIN B COMPLEX PO) Take 1 tablet by mouth daily.     [provider]  bisoprolol (ZEBETA) 5 MG tablet Take 1 tablet (5 mg total) by mouth 2 (two) times daily. 04/09/21 06/09/21  Enzo Bi, MD  busPIRone (BUSPAR) 5 MG tablet TAKE 1 TABLET BY MOUTH THREE TIMES A DAY AS NEEDED 04/12/21   [name], FNP   Cholecalciferol (VITAMIN D3) 5000 units CAPS Take 5,000 Units by mouth daily.    [provider]  DULoxetine (CYMBALTA) 30 MG capsule Take 1 capsule (30 mg total) by mouth 2 (two) times daily. Home med. 04/09/21   Enzo Bi, MD  ELIQUIS 5 MG TABS tablet TAKE 1 TABLET BY MOUTH TWICE A DAY 11/25/20   Minna Merritts, MD  esomeprazole (NEXIUM) 20 MG capsule Take 20 mg by mouth as needed.    [provider]  furosemide (LASIX) 20 MG tablet Take 1 tablet (20 mg total) by mouth daily. 06/21/21   [name], FNP  lidocaine-prilocaine (EMLA) cream Apply to affected area once 03/19/21   [name], MD  loperamide (IMODIUM) 2 MG capsule Take 1 capsule (2 mg total) by mouth See admin instructions. Initial: 4 mg, followed by 2 mg after each loose stool; maximum: 16 mg/day 05/25/21   [name], MD  magic mouthwash SOLN Take 5 mLs by mouth 2 (two) times daily. 06/23/21   [name], MD  Multiple Vitamin (MULTIVITAMIN) tablet Take 1 tablet by mouth daily.    [provider]  nystatin (MYCOSTATIN/NYSTOP) powder Apply 1 application topically 3 (three) times daily. 05/11/21   [name], MD  ondansetron (ZOFRAN) 8 MG tablet Take 1 tablet (8 mg total) by mouth every 8 (eight) hours as needed for nausea or vomiting. 03/31/21   [name], MD  potassium chloride SA (KLOR-CON) 20 MEQ tablet Take 1 tablet (20 mEq total) by mouth 2 (two) times daily. 03/03/21   [name], FNP  prochlorperazine (COMPAZINE) 10 MG tablet Take 1 tablet (10 mg total) by mouth every 6 (six) hours as needed (Nausea or vomiting). 03/19/21   [name], MD  promethazine (PHENERGAN) 25 MG tablet Take 1 tablet (25 mg total) by mouth every 6 (six) hours as needed for nausea or vomiting. 04/02/21   [name], MD  ranolazine (RANEXA) 500 MG 12 hr tablet TAKE 1 TABLET BY MOUTH TWICE A DAY 03/25/21   Minna Merritts, MD  rizatriptan (MAXALT) 10  MG tablet TAKE 1 TABLET (10 MG TOTAL) BY MOUTH AS NEEDED FOR MIGRAINE. MAY REPEAT IN 2  HOURS IF NEEDED 03/13/18   Melvenia Beam, MD  rOPINIRole (REQUIP) 2 MG tablet Take 1 tablet (2 mg total) by mouth at bedtime. 06/21/21   [name], FNP  rosuvastatin (CRESTOR) 20 MG tablet TAKE 1 TABLET BY MOUTH EVERY DAY 03/25/21   [name], FNP   I have personally, briefly reviewed patient's prior medical records in [place]  Physical Exam: Vitals:   07/02/21 1503 07/02/21 1601 07/02/21 1602 07/02/21 1603  BP:      Pulse: 93 (!) 122 97 93  Resp: 16 16 15 14   Temp:      TempSrc:      SpO2: 96% 94% 94% 95%  Weight:      Height:       Constitutional: NAD, calm, comfortable HEENT: lids and conjunctivae normal Mucous membranes are moist. Neck: normal, supple, no masses, no thyromegaly Respiratory: CTAB, no wheezing, no crackles. Normal respiratory effort. No accessory muscle use.  Cardiovascular: mildly tachycardic, irregular rhythm. no murmurs / rubs / gallops. Non-pitting extremity edema. 2+ pedal pulses. no clubbing / cyanosis.  Abdomen: soft, tender to palpation over epigastric region and entire upper quadrants. No pain in suprapubic area or lower quadrants. Musculoskeletal: No joint deformity upper and lower extremities. Normal muscle tone.  Skin: dry, intact, normal color, normal temperature on exposed skin Neurologic: Alert and oriented x 3. Normal speech.  Grossly non-focal exam. PERRL Psychiatric: Normal mood. Congruent affect.  Normal judgement and insight.  Labs on Admission: I have personally reviewed following labs and imaging studies  CBC: Recent Labs  Lab 06/30/21 0826 07/02/21 1256 07/02/21 1646  WBC 9.5 10.1 10.1  NEUTROABS 4.5  --  7.9*  HGB 11.0* 11.3* 11.4*  HCT 33.1* 33.9* 33.3*  MCV 93.8 91.1 92.0  PLT 153 148* 466*   Basic Metabolic Panel: Recent Labs  Lab 06/30/21 0826 07/02/21 1256  NA 138 137  K 3.4* 3.7  CL 104 108  CO2 24 24  GLUCOSE 108* 104*  BUN 11 19  CREATININE 0.88 0.77  CALCIUM 8.6* 8.7*    GFR: Estimated Creatinine Clearance: 87.1 mL/min (by C-G formula based on SCr of 0.77 mg/dL). Liver Function Tests: Recent Labs  Lab 06/30/21 0826 07/02/21 1256  AST 19 20  ALT 15 15  ALKPHOS 66 59  BILITOT 0.9 0.8  PROT 6.5 6.8  ALBUMIN 3.6 3.8   Recent Labs  Lab 07/02/21 1256  LIPASE 24   Urine analysis:    Component Value Date/Time   COLORURINE YELLOW 07/02/2021 1516   APPEARANCEUR HAZY (A) 07/02/2021 1516   APPEARANCEUR Clear 10/10/2014 2137   LABSPEC 1.015 07/02/2021 1516   LABSPEC 1.006 10/10/2014 2137   PHURINE 7.0 07/02/2021 1516   GLUCOSEU NEGATIVE 07/02/2021 1516   GLUCOSEU Negative 10/10/2014 2137   HGBUR LARGE (A) 07/02/2021 1516   BILIRUBINUR NEGATIVE 07/02/2021 1516   BILIRUBINUR neg 05/17/2016 1408   BILIRUBINUR Negative 10/10/2014 2137   KETONESUR NEGATIVE 07/02/2021 1516   PROTEINUR 30 (A) 07/02/2021 1516   UROBILINOGEN 0.2 05/17/2016 1408   NITRITE NEGATIVE 07/02/2021 1516   LEUKOCYTESUR TRACE (A) 07/02/2021 1516   LEUKOCYTESUR Negative 10/10/2014 2137    Radiological Exams on Admission: DG Chest 2 View  Result Date: 07/02/2021 CLINICAL DATA:  Chest pain and abdominal pain. EXAM: CHEST - 2 VIEW COMPARISON:  04/05/2021 and CT chest 08/23/2018. FINDINGS: Trachea is midline.  Heart is enlarged. Left IJ power port tip is in the SVC. Lungs are clear. No pleural fluid. IMPRESSION: No acute findings. Electronically Signed   By: Lorin Picket M.D.   On: 07/02/2021 13:22   CT Angio Chest PE W/Cm &/Or Wo Cm  Result Date: 07/02/2021 CLINICAL DATA:  Upper abdominal and chest pain. High probability for PE. EXAM: CT ANGIOGRAPHY CHEST WITH CONTRAST TECHNIQUE: Multidetector CT imaging of the chest was performed using the standard protocol during bolus administration of intravenous contrast. Multiplanar CT image reconstructions and MIPs were obtained to evaluate the vascular anatomy. CONTRAST:  159m OMNIPAQUE IOHEXOL 350 MG/ML SOLN COMPARISON:  None.  FINDINGS: Cardiovascular: Satisfactory opacification of the pulmonary arteries to the segmental level. No evidence of pulmonary embolism. Normal heart size. No pericardial effusion. The main pulmonary artery is mildly enlarged at 3.2 cm. Trace atherosclerotic calcifications visualized along the left anterior descending coronary artery. Left IJ approach port catheter visualized. The tip of the catheter terminates at the cavoatrial junction. Mediastinum/Nodes: No enlarged mediastinal, hilar, or axillary lymph nodes. Thyroid gland, trachea, and esophagus demonstrate no significant findings. Lungs/Pleura: Lungs are clear. No pleural effusion or pneumothorax. Upper Abdomen: No acute abnormality. Musculoskeletal: No chest wall abnormality. No acute or significant osseous findings. Review of the MIP images confirms the above findings. IMPRESSION: 1. No evidence of pulmonary embolus, pneumonia or other acute cardiopulmonary process. 2. Mild enlargement of the main pulmonary artery at 3.2 cm. Findings can be seen in the setting of pulmonary arterial hypertension. 3. Trace atherosclerotic calcifications along the thoracic aorta and left anterior descending coronary artery. Aortic Atherosclerosis (ICD10-I70.0). Electronically Signed   By: HJacqulynn CadetM.D.   On: 07/02/2021 15:58   CT Abdomen Pelvis W Contrast  Result Date: 07/02/2021 CLINICAL DATA:  Upper abdominal and chest pain nausea EXAM: CT ABDOMEN AND PELVIS WITH CONTRAST TECHNIQUE: Multidetector CT imaging of the abdomen and pelvis was performed using the standard protocol following bolus administration of intravenous contrast. CONTRAST:  1061mOMNIPAQUE IOHEXOL 350 MG/ML SOLN COMPARISON:  CT 04/05/2021, 11/05/2019 FINDINGS: Lower chest: Lung bases demonstrate no acute consolidation or effusion. Mild cardiomegaly. Hepatobiliary: No calcified gallstone.  No biliary dilatation. Pancreas: Unremarkable. No pancreatic ductal dilatation or surrounding inflammatory  changes. Spleen: Normal in size without focal abnormality. Adrenals/Urinary Tract: Adrenal glands are normal. Previously noted dominant cyst midpole left kidney is decreased with small residual. New left perinephric stranding and fluid. Additional hypodense renal lesions grossly unchanged. Mild left hydronephrosis without obstructing stone. Probable punctate kidney stones. Mild stranding about the left ureter. The bladder is unremarkable. Stomach/Bowel: The stomach is nonenlarged. No dilated small bowel. No acute bowel wall thickening. Mild diverticular disease of the left colon. Vascular/Lymphatic: Moderate aortic atherosclerosis. No aneurysm. No suspicious nodes. Reproductive: Status post hysterectomy. No adnexal masses. Other: Negative for pelvic effusion or free air. Musculoskeletal: No acute or suspicious osseous abnormality. Right hip replacement with artifact. IMPRESSION: 1. Mild left hydronephrosis with new left perinephric fat stranding and small amount of perinephric fluid. Mild soft tissue stranding about the left ureter. Findings could be secondary to recently passed kidney stone versus ascending urinary tract infection. A dominant cyst at the midpole left kidney has significantly decreased in size and rupture of the cyst could be contributing to some of the left perinephric fluid. 2. Negative for bowel obstruction or bowel wall thickening 3. Mild cardiomegaly. 4. Diverticular disease of the left colon without acute wall thickening. 5. Nonobstructing punctate kidney stones. Electronically Signed   By: KiDonavan Foil  M.D.   On: 07/02/2021 16:17    EKG: Independently reviewed. Afib. HR approximately 128.   Assessment/Plan Principal Problem:   Pyelonephritis   Pyelonephritis   abdominal pain- as evidenced by urinalysis and CT. Gross hematuria seen in canister on exam. No suprapubic pain or fever. Lipase negative. >50 RBC and WBC on urinalysis with trace leukocytes.  - continue CTX - analgesia  PRN - PO hydration encouraged and gentle IV fluids - BMP, CBC am  pAfib in current RVR in 110-120s HR- likely exacerbated by acute infection. Already on anticoagulation. Diltiazem gtt ordered by ED provider but not hung yet at time of exam. Troponins negative, no ST changes. S/p multiple failed cardioversions.  - continue dilt gtt - continuous cardiac monitoring - continue home eliquis - TSH pending  Anxiety/depression-chronic, stable - continue home medications- xanax PRN, buspirone, duloxetine  Malignant breast cancer- diagnosed 01/2021. - chemotherapy to be directed by oncology outpatient - supportive care for chemotherapy side effects including antiemetics   HTN- normotensive on presentation - holding home metoprolol while on gtt - continue home rosuvasatin  OSA- chronic, stable - CPAP QHS  CKD- in history. Patient has normal creatinine and GFR on presentation. Continue to monitor  DVT prophylaxis:  apixaban (ELIQUIS) tablet 5 mg   Code Status: full  Family Communication: none  Disposition Plan: TBD  Consults called: none   Richarda Osmond, DO [facility]  07/02/2021, 6:01 PM    To contact the appropriate [facility] Attending or Consulting provider: Check the care team in [place] for a) attending/consulting [facility] provider name and b) the [facility] team listed Log into [email or web address] and use [place]'s universal password to access. If you do not have the password, please contact the hospital operator. Locate the [facility] provider you are looking for under [facility] and page to a number that you can be directly reached. (Text pages appreciated) If you still have difficulty reaching the provider, please page the [facility] (Director on Call) for the Hospitalists listed on amion for assistance.

## 2021-07-02 NOTE — ED Notes (Signed)
Patient transported to CT 

## 2021-07-03 DIAGNOSIS — E890 Postprocedural hypothyroidism: Secondary | ICD-10-CM | POA: Diagnosis present

## 2021-07-03 DIAGNOSIS — N189 Chronic kidney disease, unspecified: Secondary | ICD-10-CM | POA: Diagnosis present

## 2021-07-03 DIAGNOSIS — R310 Gross hematuria: Secondary | ICD-10-CM | POA: Diagnosis not present

## 2021-07-03 DIAGNOSIS — I480 Paroxysmal atrial fibrillation: Secondary | ICD-10-CM | POA: Diagnosis present

## 2021-07-03 DIAGNOSIS — Z90710 Acquired absence of both cervix and uterus: Secondary | ICD-10-CM | POA: Diagnosis not present

## 2021-07-03 DIAGNOSIS — Z87891 Personal history of nicotine dependence: Secondary | ICD-10-CM | POA: Diagnosis not present

## 2021-07-03 DIAGNOSIS — I509 Heart failure, unspecified: Secondary | ICD-10-CM | POA: Diagnosis present

## 2021-07-03 DIAGNOSIS — I130 Hypertensive heart and chronic kidney disease with heart failure and stage 1 through stage 4 chronic kidney disease, or unspecified chronic kidney disease: Secondary | ICD-10-CM | POA: Diagnosis present

## 2021-07-03 DIAGNOSIS — N136 Pyonephrosis: Secondary | ICD-10-CM | POA: Diagnosis present

## 2021-07-03 DIAGNOSIS — E785 Hyperlipidemia, unspecified: Secondary | ICD-10-CM | POA: Diagnosis present

## 2021-07-03 DIAGNOSIS — G4733 Obstructive sleep apnea (adult) (pediatric): Secondary | ICD-10-CM | POA: Diagnosis present

## 2021-07-03 DIAGNOSIS — M8580 Other specified disorders of bone density and structure, unspecified site: Secondary | ICD-10-CM | POA: Diagnosis present

## 2021-07-03 DIAGNOSIS — N1330 Unspecified hydronephrosis: Secondary | ICD-10-CM | POA: Diagnosis not present

## 2021-07-03 DIAGNOSIS — R1013 Epigastric pain: Secondary | ICD-10-CM | POA: Diagnosis present

## 2021-07-03 DIAGNOSIS — Z8249 Family history of ischemic heart disease and other diseases of the circulatory system: Secondary | ICD-10-CM | POA: Diagnosis not present

## 2021-07-03 DIAGNOSIS — Z20822 Contact with and (suspected) exposure to covid-19: Secondary | ICD-10-CM | POA: Diagnosis present

## 2021-07-03 DIAGNOSIS — C50411 Malignant neoplasm of upper-outer quadrant of right female breast: Secondary | ICD-10-CM | POA: Diagnosis present

## 2021-07-03 DIAGNOSIS — E559 Vitamin D deficiency, unspecified: Secondary | ICD-10-CM | POA: Diagnosis present

## 2021-07-03 DIAGNOSIS — J449 Chronic obstructive pulmonary disease, unspecified: Secondary | ICD-10-CM | POA: Diagnosis present

## 2021-07-03 DIAGNOSIS — Z818 Family history of other mental and behavioral disorders: Secondary | ICD-10-CM | POA: Diagnosis not present

## 2021-07-03 DIAGNOSIS — K319 Disease of stomach and duodenum, unspecified: Secondary | ICD-10-CM | POA: Diagnosis present

## 2021-07-03 DIAGNOSIS — N12 Tubulo-interstitial nephritis, not specified as acute or chronic: Secondary | ICD-10-CM | POA: Diagnosis not present

## 2021-07-03 DIAGNOSIS — Z803 Family history of malignant neoplasm of breast: Secondary | ICD-10-CM | POA: Diagnosis not present

## 2021-07-03 DIAGNOSIS — Z171 Estrogen receptor negative status [ER-]: Secondary | ICD-10-CM | POA: Diagnosis not present

## 2021-07-03 DIAGNOSIS — Z96641 Presence of right artificial hip joint: Secondary | ICD-10-CM | POA: Diagnosis present

## 2021-07-03 DIAGNOSIS — F419 Anxiety disorder, unspecified: Secondary | ICD-10-CM | POA: Diagnosis present

## 2021-07-03 DIAGNOSIS — F32A Depression, unspecified: Secondary | ICD-10-CM | POA: Diagnosis present

## 2021-07-03 DIAGNOSIS — G2581 Restless legs syndrome: Secondary | ICD-10-CM | POA: Diagnosis present

## 2021-07-03 LAB — CBC
HCT: 33.8 % — ABNORMAL LOW (ref 36.0–46.0)
Hemoglobin: 11.5 g/dL — ABNORMAL LOW (ref 12.0–15.0)
MCH: 31.1 pg (ref 26.0–34.0)
MCHC: 34 g/dL (ref 30.0–36.0)
MCV: 91.4 fL (ref 80.0–100.0)
Platelets: 131 10*3/uL — ABNORMAL LOW (ref 150–400)
RBC: 3.7 MIL/uL — ABNORMAL LOW (ref 3.87–5.11)
RDW: 14.4 % (ref 11.5–15.5)
WBC: 4.8 10*3/uL (ref 4.0–10.5)
nRBC: 0.6 % — ABNORMAL HIGH (ref 0.0–0.2)

## 2021-07-03 LAB — BASIC METABOLIC PANEL
Anion gap: 7 (ref 5–15)
BUN: 18 mg/dL (ref 8–23)
CO2: 26 mmol/L (ref 22–32)
Calcium: 8.5 mg/dL — ABNORMAL LOW (ref 8.9–10.3)
Chloride: 105 mmol/L (ref 98–111)
Creatinine, Ser: 0.56 mg/dL (ref 0.44–1.00)
GFR, Estimated: >60 mL/min (ref >60)
Glucose, Bld: 88 mg/dL (ref 70–99)
Potassium: 3.8 mmol/L (ref 3.5–5.1)
Sodium: 138 mmol/L (ref 135–145)

## 2021-07-03 MED ORDER — METOPROLOL TARTRATE 25 MG PO TABS: 25.0000 mg | ORAL_TABLET | Freq: Four times a day (QID) | ORAL | Status: DC

## 2021-07-03 MED ORDER — BISOPROLOL FUMARATE 5 MG PO TABS
5.0000 mg | ORAL_TABLET | Freq: Two times a day (BID) | ORAL | Status: DC
  Administered 2021-07-04 – 2021-07-07 (×8): 5 mg via ORAL
  Filled 2021-07-03 (×10): qty 1

## 2021-07-03 MED ORDER — MORPHINE SULFATE (PF) 2 MG/ML IV SOLN
1.0000 mg | INTRAVENOUS | Status: DC | PRN
  Administered 2021-07-03 – 2021-07-04 (×5): 1 mg via INTRAVENOUS
  Filled 2021-07-03 (×5): qty 1

## 2021-07-03 MED ORDER — PANTOPRAZOLE SODIUM 40 MG PO TBEC
40.0000 mg | DELAYED_RELEASE_TABLET | Freq: Two times a day (BID) | ORAL | Status: DC
  Administered 2021-07-03 – 2021-07-07 (×9): 40 mg via ORAL
  Filled 2021-07-03 (×9): qty 1

## 2021-07-03 NOTE — Progress Notes (Signed)
PROGRESS NOTE    Amber Barnes  YHC:[id number] DOB: 08-Oct-1950 DOA: 07/02/2021 PCP: [name], FNP    Brief Narrative:  Amber Barnes is a 70 y.o. female with a PMH significant for breast cancer currently receiving chemotherapy, pAfib, anxiety/depression, HTN, OSA, OA, COPD, CKD. They presented from home to the ED on 07/02/2021 with epigastric pain x1 days.  Patient states that she began having epigastric radiating to back pain this morning which gradually worsened throughout the day. She had one episode of emesis after onset but has not had any reoccurrence or nausea since then. She has had fluttering in her chest which is the typical sensation for her when she goes into Afib. She states this is common for her and she has been adherent with anticoagulation. She has not tried anything for it at home and decided to come to the ED after discussing with someone in her oncology office to rule our cardiac cause.  In the ED, it was found that they had signs of pyelonephritis on CT, urinalysis. She was in Afib with RVR in 110-120s and symptomatic but hemodynamically stable and afebrile. Metoprolol transiently improved her HR but returned to RVR so was started on dilt gtt in ED.   Consultants:    Procedures:   Antimicrobials:  Ceftriaxone   Subjective: With mild epigastric pain.  Some chills.  No shortness of breath or chest pain.  Objective: Vitals:   07/03/21 0630 07/03/21 0815 07/03/21 1000 07/03/21 1300  BP: 120/87 (!) 154/82 138/88 132/84  Pulse: (!) 51 65 88 78  Resp: 16 15 15 20   Temp:      TempSrc:      SpO2: 98% 96% 94% 94%  Weight:      Height:       No intake or output data in the 24 hours ending 07/03/21 1555 Filed Weights   07/02/21 1253  Weight: 119.5 kg    Examination:  General exam: Appears calm and comfortable  Respiratory system: Clear to auscultation. Respiratory effort normal. Cardiovascular system: S1 & S2 heard, RRR. No JVD, murmurs, rubs,  gallops or clicks. Gastrointestinal system: Abdomen is nondistended, soft and nontender.  Normal bowel sounds heard. Central nervous system: Alert and oriented.  Grossly intact  extremities: No edema Psychiatry: Judgement and insight appear normal. Mood & affect appropriate.     Data Reviewed: I have personally reviewed following labs and imaging studies  CBC: Recent Labs  Lab 06/30/21 0826 07/02/21 1256 07/02/21 1646 07/03/21 0711  WBC 9.5 10.1 10.1 4.8  NEUTROABS 4.5  --  7.9*  --   HGB 11.0* 11.3* 11.4* 11.5*  HCT 33.1* 33.9* 33.3* 33.8*  MCV 93.8 91.1 92.0 91.4  PLT 153 148* 148* 517*   Basic Metabolic Panel: Recent Labs  Lab 06/30/21 0826 07/02/21 1256 07/03/21 0711  NA 138 137 138  K 3.4* 3.7 3.8  CL 104 108 105  CO2 24 24 26   GLUCOSE 108* 104* 88  BUN 11 19 18   CREATININE 0.88 0.77 0.56  CALCIUM 8.6* 8.7* 8.5*   GFR: Estimated Creatinine Clearance: 87.1 mL/min (by C-G formula based on SCr of 0.56 mg/dL). Liver Function Tests: Recent Labs  Lab 06/30/21 0826 07/02/21 1256  AST 19 20  ALT 15 15  ALKPHOS 66 59  BILITOT 0.9 0.8  PROT 6.5 6.8  ALBUMIN 3.6 3.8   Recent Labs  Lab 07/02/21 1256  LIPASE 24   No results for input(s): AMMONIA in the last 168 hours. Coagulation Profile: No  results for input(s): INR, PROTIME in the last 168 hours. Cardiac Enzymes: No results for input(s): CKTOTAL, CKMB, CKMBINDEX, TROPONINI in the last 168 hours. BNP (last 3 results) No results for input(s): PROBNP in the last 8760 hours. HbA1C: No results for input(s): HGBA1C in the last 72 hours. CBG: No results for input(s): GLUCAP in the last 168 hours. Lipid Profile: No results for input(s): CHOL, HDL, LDLCALC, TRIG, CHOLHDL, LDLDIRECT in the last 72 hours. Thyroid Function Tests: Recent Labs    07/02/21 1516  TSH 2.060   Anemia Panel: No results for input(s): VITAMINB12, FOLATE, FERRITIN, TIBC, IRON, RETICCTPCT in the last 72 hours. Sepsis Labs: No  results for input(s): PROCALCITON, LATICACIDVEN in the last 168 hours.  Recent Results (from the past 240 hour(s))  Resp Panel by RT-PCR (Flu A&B, Covid) Nasopharyngeal Swab     Status: None   Collection Time: 07/02/21  2:45 PM   Specimen: Nasopharyngeal Swab; Nasopharyngeal(NP) swabs in vial transport medium  Result Value Ref Range Status   SARS Coronavirus 2 by RT PCR NEGATIVE NEGATIVE Final    Comment: (NOTE) SARS-CoV-2 target nucleic acids are NOT DETECTED.  The SARS-CoV-2 RNA is generally detectable in upper respiratory specimens during the acute phase of infection. The lowest concentration of SARS-CoV-2 viral copies this assay can detect is 138 copies/mL. A negative result does not preclude SARS-Cov-2 infection and should not be used as the sole basis for treatment or other patient management decisions. A negative result may occur with  improper specimen collection/handling, submission of specimen other than nasopharyngeal swab, presence of viral mutation(s) within the areas targeted by this assay, and inadequate number of viral copies(<138 copies/mL). A negative result must be combined with clinical observations, patient history, and epidemiological information. The expected result is Negative.  Fact Sheet for Patients:  EntrepreneurPulse.com.au  Fact Sheet for Healthcare Providers:  IncredibleEmployment.be  This test is no t yet approved or cleared by the [place] FDA and  has been authorized for detection and/or diagnosis of SARS-CoV-2 by FDA under an Emergency Use Authorization (EUA). This EUA will remain  in effect (meaning this test can be used) for the duration of the COVID-19 declaration under Section 564(b)(1) of the Act, 21 U.S.C.section 360bbb-3(b)(1), unless the authorization is terminated  or revoked sooner.       Influenza A by PCR NEGATIVE NEGATIVE Final   Influenza B by PCR NEGATIVE NEGATIVE Final    Comment:  (NOTE) The Xpert Xpress SARS-CoV-2/FLU/RSV plus assay is intended as an aid in the diagnosis of influenza from Nasopharyngeal swab specimens and should not be used as a sole basis for treatment. Nasal washings and aspirates are unacceptable for Xpert Xpress SARS-CoV-2/FLU/RSV testing.  Fact Sheet for Patients: EntrepreneurPulse.com.au  Fact Sheet for Healthcare Providers: IncredibleEmployment.be  This test is not yet approved or cleared by the [place] FDA and has been authorized for detection and/or diagnosis of SARS-CoV-2 by FDA under an Emergency Use Authorization (EUA). This EUA will remain in effect (meaning this test can be used) for the duration of the COVID-19 declaration under Section 564(b)(1) of the Act, 21 U.S.C. section 360bbb-3(b)(1), unless the authorization is terminated or revoked.  Performed at [facility], [address], [place], [place] [address]          Radiology Studies: DG Chest 2 View  Result Date: 07/02/2021 CLINICAL DATA:  Chest pain and abdominal pain. EXAM: CHEST - 2 VIEW COMPARISON:  04/05/2021 and CT chest 08/23/2018. FINDINGS: Trachea is midline. Heart  is enlarged. Left IJ power port tip is in the SVC. Lungs are clear. No pleural fluid. IMPRESSION: No acute findings. Electronically Signed   By: Lorin Picket M.D.   On: 07/02/2021 13:22   CT Angio Chest PE W/Cm &/Or Wo Cm  Result Date: 07/02/2021 CLINICAL DATA:  Upper abdominal and chest pain. High probability for PE. EXAM: CT ANGIOGRAPHY CHEST WITH CONTRAST TECHNIQUE: Multidetector CT imaging of the chest was performed using the standard protocol during bolus administration of intravenous contrast. Multiplanar CT image reconstructions and MIPs were obtained to evaluate the vascular anatomy. CONTRAST:  134mL OMNIPAQUE IOHEXOL 350 MG/ML SOLN COMPARISON:  None. FINDINGS: Cardiovascular: Satisfactory opacification of the pulmonary arteries to the  segmental level. No evidence of pulmonary embolism. Normal heart size. No pericardial effusion. The main pulmonary artery is mildly enlarged at 3.2 cm. Trace atherosclerotic calcifications visualized along the left anterior descending coronary artery. Left IJ approach port catheter visualized. The tip of the catheter terminates at the cavoatrial junction. Mediastinum/Nodes: No enlarged mediastinal, hilar, or axillary lymph nodes. Thyroid gland, trachea, and esophagus demonstrate no significant findings. Lungs/Pleura: Lungs are clear. No pleural effusion or pneumothorax. Upper Abdomen: No acute abnormality. Musculoskeletal: No chest wall abnormality. No acute or significant osseous findings. Review of the MIP images confirms the above findings. IMPRESSION: 1. No evidence of pulmonary embolus, pneumonia or other acute cardiopulmonary process. 2. Mild enlargement of the main pulmonary artery at 3.2 cm. Findings can be seen in the setting of pulmonary arterial hypertension. 3. Trace atherosclerotic calcifications along the thoracic aorta and left anterior descending coronary artery. Aortic Atherosclerosis (ICD10-I70.0). Electronically Signed   By: Jacqulynn Cadet M.D.   On: 07/02/2021 15:58   CT Abdomen Pelvis W Contrast  Result Date: 07/02/2021 CLINICAL DATA:  Upper abdominal and chest pain nausea EXAM: CT ABDOMEN AND PELVIS WITH CONTRAST TECHNIQUE: Multidetector CT imaging of the abdomen and pelvis was performed using the standard protocol following bolus administration of intravenous contrast. CONTRAST:  124mL OMNIPAQUE IOHEXOL 350 MG/ML SOLN COMPARISON:  CT 04/05/2021, 11/05/2019 FINDINGS: Lower chest: Lung bases demonstrate no acute consolidation or effusion. Mild cardiomegaly. Hepatobiliary: No calcified gallstone.  No biliary dilatation. Pancreas: Unremarkable. No pancreatic ductal dilatation or surrounding inflammatory changes. Spleen: Normal in size without focal abnormality. Adrenals/Urinary Tract:  Adrenal glands are normal. Previously noted dominant cyst midpole left kidney is decreased with small residual. New left perinephric stranding and fluid. Additional hypodense renal lesions grossly unchanged. Mild left hydronephrosis without obstructing stone. Probable punctate kidney stones. Mild stranding about the left ureter. The bladder is unremarkable. Stomach/Bowel: The stomach is nonenlarged. No dilated small bowel. No acute bowel wall thickening. Mild diverticular disease of the left colon. Vascular/Lymphatic: Moderate aortic atherosclerosis. No aneurysm. No suspicious nodes. Reproductive: Status post hysterectomy. No adnexal masses. Other: Negative for pelvic effusion or free air. Musculoskeletal: No acute or suspicious osseous abnormality. Right hip replacement with artifact. IMPRESSION: 1. Mild left hydronephrosis with new left perinephric fat stranding and small amount of perinephric fluid. Mild soft tissue stranding about the left ureter. Findings could be secondary to recently passed kidney stone versus ascending urinary tract infection. A dominant cyst at the midpole left kidney has significantly decreased in size and rupture of the cyst could be contributing to some of the left perinephric fluid. 2. Negative for bowel obstruction or bowel wall thickening 3. Mild cardiomegaly. 4. Diverticular disease of the left colon without acute wall thickening. 5. Nonobstructing punctate kidney stones. Electronically Signed   By: Madie Reno.D.  On: 07/02/2021 16:17        Scheduled Meds:  apixaban  5 mg Oral BID   busPIRone  5 mg Oral TID   DULoxetine  30 mg Oral BID   magic mouthwash  5 mL Oral BID   pantoprazole  40 mg Oral BID   rOPINIRole  2 mg Oral QHS   rosuvastatin  20 mg Oral QHS   Continuous Infusions:  cefTRIAXone (ROCEPHIN)  IV Stopped (07/02/21 2011)   diltiazem (CARDIZEM) infusion 5 mg/hr (07/03/21 0151)    Assessment & Plan:   Principal Problem:   Pyelonephritis Active  Problems:   Atrial fibrillation with RVR (HCC)   Pyelonephritis   abdominal pain- as evidenced by urinalysis and CT. Gross hematuria seen in canister on admission 12/23 continue iv abx I Do not see any urine cultures Added PPI      Afib rvr likely exacerbated by acute infection.  12/24 continue Eliquis  Will resume her bisoprolol  Continue Cardizem drip and if BP tolerates will start p.o. Cardizem  Follow-up TSH   HTN-  Resume bisoprolol       Anxiety/depression-chronic, stable -Continue home meds BuSpar and Cymbalta  Xanax as needed      Malignant breast cancer- diagnosed 01/2021. - chemotherapy to be directed by oncology outpatient - supportive care for chemotherapy side effects including antiemetics     OSA- chronic, stable - CPAP QHS      DVT prophylaxis: Eliquis Code Status: Full Family Communication: None at bedside Disposition Plan:  Status is: Inpatient  Remains inpatient appropriate because: IV treatment            LOS: 0 days   Time spent: 35 minutes with more than 50% on [place], MD [facility] Pager [phone]  If 7PM-7AM, please contact night-coverage 07/03/2021, 3:55 PM

## 2021-07-03 NOTE — ED Notes (Signed)
Pt denies nausea, endorses SHOB.

## 2021-07-03 NOTE — ED Notes (Signed)
Pt given a cup of ice water

## 2021-07-03 NOTE — ED Notes (Signed)
Pt resting in bed, alert talking with RN at this time.

## 2021-07-04 DIAGNOSIS — N12 Tubulo-interstitial nephritis, not specified as acute or chronic: Secondary | ICD-10-CM | POA: Diagnosis not present

## 2021-07-04 LAB — CBC
HCT: 32.4 % — ABNORMAL LOW (ref 36.0–46.0)
Hemoglobin: 11.2 g/dL — ABNORMAL LOW (ref 12.0–15.0)
MCH: 31.3 pg (ref 26.0–34.0)
MCHC: 34.6 g/dL (ref 30.0–36.0)
MCV: 90.5 fL (ref 80.0–100.0)
Platelets: 139 10*3/uL — ABNORMAL LOW (ref 150–400)
RBC: 3.58 MIL/uL — ABNORMAL LOW (ref 3.87–5.11)
RDW: 13.7 % (ref 11.5–15.5)
WBC: 4.8 10*3/uL (ref 4.0–10.5)
nRBC: 0.6 % — ABNORMAL HIGH (ref 0.0–0.2)

## 2021-07-04 MED ORDER — CHLORHEXIDINE GLUCONATE CLOTH 2 % EX PADS
6.0000 | MEDICATED_PAD | Freq: Every day | CUTANEOUS | Status: DC
  Administered 2021-07-04 – 2021-07-06 (×3): 6 via TOPICAL

## 2021-07-04 MED ORDER — SUCRALFATE 1 GM/10ML PO SUSP
1.0000 g | Freq: Three times a day (TID) | ORAL | Status: DC
  Administered 2021-07-04 – 2021-07-07 (×14): 1 g via ORAL
  Filled 2021-07-04 (×14): qty 10

## 2021-07-04 MED ORDER — SODIUM CHLORIDE 0.9 % IV SOLN: INTRAVENOUS | Status: DC

## 2021-07-04 MED ORDER — DILTIAZEM HCL 30 MG PO TABS
30.0000 mg | ORAL_TABLET | Freq: Four times a day (QID) | ORAL | Status: DC
  Administered 2021-07-04 – 2021-07-05 (×5): 30 mg via ORAL
  Filled 2021-07-04 (×5): qty 1

## 2021-07-04 MED ORDER — MORPHINE SULFATE (PF) 2 MG/ML IV SOLN
1.0000 mg | INTRAVENOUS | Status: DC | PRN
  Administered 2021-07-04 – 2021-07-06 (×19): 1 mg via INTRAVENOUS
  Filled 2021-07-04 (×19): qty 1

## 2021-07-04 NOTE — Progress Notes (Signed)
PROGRESS NOTE    Amber Barnes  TDD:[id number] DOB: [birth year] DOA: 07/02/2021 PCP: [name], FNP    Brief Narrative:  Amber Barnes is a 70 y.o. female with a PMH significant for breast cancer currently receiving chemotherapy, pAfib, anxiety/depression, HTN, OSA, OA, COPD, CKD. They presented from home to the ED on 07/02/2021 with epigastric pain x1 days.  Patient states that she began having epigastric radiating to back pain this morning which gradually worsened throughout the day. She had one episode of emesis after onset but has not had any reoccurrence or nausea since then. She has had fluttering in her chest which is the typical sensation for her when she goes into Afib. She states this is common for her and she has been adherent with anticoagulation. She has not tried anything for it at home and decided to come to the ED after discussing with someone in her oncology office to rule our cardiac cause.  In the ED, it was found that they had signs of pyelonephritis on CT, urinalysis. She was in Afib with RVR in 110-120s and symptomatic but hemodynamically stable and afebrile. Metoprolol transiently improved her HR but returned to RVR so was started on dilt gtt in ED.   12/25 c/o abd pain. HR variable overall improving  Consultants:    Procedures:   Antimicrobials:  Ceftriaxone   Subjective: Still with epigastric pain no nausea or vomiting..  Canister with dark maroon blood  urine  Objective: Vitals:   07/03/21 2317 07/04/21 0035 07/04/21 0422 07/04/21 0819  BP: 133/72 (!) 143/98 137/88 124/82  Pulse: 85 68 96 100  Resp: 18 18 16    Temp:  97.8 F (36.6 C) 98 F (36.7 C) (!) 97.4 F (36.3 C)  TempSrc:   Oral Oral  SpO2: 96% 97% 97% 98%  Weight:      Height:        Intake/Output Summary (Last 24 hours) at 07/04/2021 1409 Last data filed at 07/04/2021 0957 Gross per 24 hour  Intake 425.07 ml  Output --  Net 425.07 ml   Filed Weights   07/02/21 1253   Weight: 119.5 kg    Examination: Calm, NAD CTA no wheezing Irregular S1-S2 no gallops Mild tender to palpation in epigastric area, positive bowel sounds no rebound No edema Awake oriented x3 Soft benign positive bowel sounds  mood and affect appropriate in current setting  Data Reviewed: I have personally reviewed following labs and imaging studies  CBC: Recent Labs  Lab 06/30/21 0826 07/02/21 1256 07/02/21 1646 07/03/21 0711 07/04/21 1033  WBC 9.5 10.1 10.1 4.8 4.8  NEUTROABS 4.5  --  7.9*  --   --   HGB 11.0* 11.3* 11.4* 11.5* 11.2*  HCT 33.1* 33.9* 33.3* 33.8* 32.4*  MCV 93.8 91.1 92.0 91.4 90.5  PLT 153 148* 148* 131* 623*   Basic Metabolic Panel: Recent Labs  Lab 06/30/21 0826 07/02/21 1256 07/03/21 0711  NA 138 137 138  K 3.4* 3.7 3.8  CL 104 108 105  CO2 24 24 26   GLUCOSE 108* 104* 88  BUN 11 19 18   CREATININE 0.88 0.77 0.56  CALCIUM 8.6* 8.7* 8.5*   GFR: Estimated Creatinine Clearance: 87.1 mL/min (by C-G formula based on SCr of 0.56 mg/dL). Liver Function Tests: Recent Labs  Lab 06/30/21 0826 07/02/21 1256  AST 19 20  ALT 15 15  ALKPHOS 66 59  BILITOT 0.9 0.8  PROT 6.5 6.8  ALBUMIN 3.6 3.8   Recent Labs  Lab 07/02/21  1256  LIPASE 24   No results for input(s): AMMONIA in the last 168 hours. Coagulation Profile: No results for input(s): INR, PROTIME in the last 168 hours. Cardiac Enzymes: No results for input(s): CKTOTAL, CKMB, CKMBINDEX, TROPONINI in the last 168 hours. BNP (last 3 results) No results for input(s): PROBNP in the last 8760 hours. HbA1C: No results for input(s): HGBA1C in the last 72 hours. CBG: No results for input(s): GLUCAP in the last 168 hours. Lipid Profile: No results for input(s): CHOL, HDL, LDLCALC, TRIG, CHOLHDL, LDLDIRECT in the last 72 hours. Thyroid Function Tests: Recent Labs    07/02/21 1516  TSH 2.060   Anemia Panel: No results for input(s): VITAMINB12, FOLATE, FERRITIN, TIBC, IRON, RETICCTPCT  in the last 72 hours. Sepsis Labs: No results for input(s): PROCALCITON, LATICACIDVEN in the last 168 hours.  Recent Results (from the past 240 hour(s))  Resp Panel by RT-PCR (Flu A&B, Covid) Nasopharyngeal Swab     Status: None   Collection Time: 07/02/21  2:45 PM   Specimen: Nasopharyngeal Swab; Nasopharyngeal(NP) swabs in vial transport medium  Result Value Ref Range Status   SARS Coronavirus 2 by RT PCR NEGATIVE NEGATIVE Final    Comment: (NOTE) SARS-CoV-2 target nucleic acids are NOT DETECTED.  The SARS-CoV-2 RNA is generally detectable in upper respiratory specimens during the acute phase of infection. The lowest concentration of SARS-CoV-2 viral copies this assay can detect is 138 copies/mL. A negative result does not preclude SARS-Cov-2 infection and should not be used as the sole basis for treatment or other patient management decisions. A negative result may occur with  improper specimen collection/handling, submission of specimen other than nasopharyngeal swab, presence of viral mutation(s) within the areas targeted by this assay, and inadequate number of viral copies(<138 copies/mL). A negative result must be combined with clinical observations, patient history, and epidemiological information. The expected result is Negative.  Fact Sheet for Patients:  EntrepreneurPulse.com.au  Fact Sheet for Healthcare Providers:  IncredibleEmployment.be  This test is no t yet approved or cleared by the [place] FDA and  has been authorized for detection and/or diagnosis of SARS-CoV-2 by FDA under an Emergency Use Authorization (EUA). This EUA will remain  in effect (meaning this test can be used) for the duration of the COVID-19 declaration under Section 564(b)(1) of the Act, 21 U.S.C.section 360bbb-3(b)(1), unless the authorization is terminated  or revoked sooner.       Influenza A by PCR NEGATIVE NEGATIVE Final   Influenza B by PCR  NEGATIVE NEGATIVE Final    Comment: (NOTE) The Xpert Xpress SARS-CoV-2/FLU/RSV plus assay is intended as an aid in the diagnosis of influenza from Nasopharyngeal swab specimens and should not be used as a sole basis for treatment. Nasal washings and aspirates are unacceptable for Xpert Xpress SARS-CoV-2/FLU/RSV testing.  Fact Sheet for Patients: EntrepreneurPulse.com.au  Fact Sheet for Healthcare Providers: IncredibleEmployment.be  This test is not yet approved or cleared by the [place] FDA and has been authorized for detection and/or diagnosis of SARS-CoV-2 by FDA under an Emergency Use Authorization (EUA). This EUA will remain in effect (meaning this test can be used) for the duration of the COVID-19 declaration under Section 564(b)(1) of the Act, 21 U.S.C. section 360bbb-3(b)(1), unless the authorization is terminated or revoked.  Performed at [facility], [address], [place], [place] [address]          Radiology Studies: CT Angio Chest PE W/Cm &/Or Wo Cm  Result Date: 07/02/2021 CLINICAL DATA:  Upper abdominal and chest pain. High probability for PE. EXAM: CT ANGIOGRAPHY CHEST WITH CONTRAST TECHNIQUE: Multidetector CT imaging of the chest was performed using the standard protocol during bolus administration of intravenous contrast. Multiplanar CT image reconstructions and MIPs were obtained to evaluate the vascular anatomy. CONTRAST:  165mL OMNIPAQUE IOHEXOL 350 MG/ML SOLN COMPARISON:  None. FINDINGS: Cardiovascular: Satisfactory opacification of the pulmonary arteries to the segmental level. No evidence of pulmonary embolism. Normal heart size. No pericardial effusion. The main pulmonary artery is mildly enlarged at 3.2 cm. Trace atherosclerotic calcifications visualized along the left anterior descending coronary artery. Left IJ approach port catheter visualized. The tip of the catheter terminates at the cavoatrial  junction. Mediastinum/Nodes: No enlarged mediastinal, hilar, or axillary lymph nodes. Thyroid gland, trachea, and esophagus demonstrate no significant findings. Lungs/Pleura: Lungs are clear. No pleural effusion or pneumothorax. Upper Abdomen: No acute abnormality. Musculoskeletal: No chest wall abnormality. No acute or significant osseous findings. Review of the MIP images confirms the above findings. IMPRESSION: 1. No evidence of pulmonary embolus, pneumonia or other acute cardiopulmonary process. 2. Mild enlargement of the main pulmonary artery at 3.2 cm. Findings can be seen in the setting of pulmonary arterial hypertension. 3. Trace atherosclerotic calcifications along the thoracic aorta and left anterior descending coronary artery. Aortic Atherosclerosis (ICD10-I70.0). Electronically Signed   By: Jacqulynn Cadet M.D.   On: 07/02/2021 15:58   CT Abdomen Pelvis W Contrast  Result Date: 07/02/2021 CLINICAL DATA:  Upper abdominal and chest pain nausea EXAM: CT ABDOMEN AND PELVIS WITH CONTRAST TECHNIQUE: Multidetector CT imaging of the abdomen and pelvis was performed using the standard protocol following bolus administration of intravenous contrast. CONTRAST:  185mL OMNIPAQUE IOHEXOL 350 MG/ML SOLN COMPARISON:  CT 04/05/2021, 11/05/2019 FINDINGS: Lower chest: Lung bases demonstrate no acute consolidation or effusion. Mild cardiomegaly. Hepatobiliary: No calcified gallstone.  No biliary dilatation. Pancreas: Unremarkable. No pancreatic ductal dilatation or surrounding inflammatory changes. Spleen: Normal in size without focal abnormality. Adrenals/Urinary Tract: Adrenal glands are normal. Previously noted dominant cyst midpole left kidney is decreased with small residual. New left perinephric stranding and fluid. Additional hypodense renal lesions grossly unchanged. Mild left hydronephrosis without obstructing stone. Probable punctate kidney stones. Mild stranding about the left ureter. The bladder is  unremarkable. Stomach/Bowel: The stomach is nonenlarged. No dilated small bowel. No acute bowel wall thickening. Mild diverticular disease of the left colon. Vascular/Lymphatic: Moderate aortic atherosclerosis. No aneurysm. No suspicious nodes. Reproductive: Status post hysterectomy. No adnexal masses. Other: Negative for pelvic effusion or free air. Musculoskeletal: No acute or suspicious osseous abnormality. Right hip replacement with artifact. IMPRESSION: 1. Mild left hydronephrosis with new left perinephric fat stranding and small amount of perinephric fluid. Mild soft tissue stranding about the left ureter. Findings could be secondary to recently passed kidney stone versus ascending urinary tract infection. A dominant cyst at the midpole left kidney has significantly decreased in size and rupture of the cyst could be contributing to some of the left perinephric fluid. 2. Negative for bowel obstruction or bowel wall thickening 3. Mild cardiomegaly. 4. Diverticular disease of the left colon without acute wall thickening. 5. Nonobstructing punctate kidney stones. Electronically Signed   By: Donavan Foil M.D.   On: 07/02/2021 16:17        Scheduled Meds:  apixaban  5 mg Oral BID   bisoprolol  5 mg Oral BID   busPIRone  5 mg Oral TID   Chlorhexidine Gluconate Cloth  6 each Topical Daily   diltiazem  30  mg Oral Q6H   DULoxetine  30 mg Oral BID   magic mouthwash  5 mL Oral BID   pantoprazole  40 mg Oral BID   rOPINIRole  2 mg Oral QHS   rosuvastatin  20 mg Oral QHS   sucralfate  1 g Oral TID WC & HS   Continuous Infusions:  sodium chloride 75 mL/hr at 07/04/21 1157   cefTRIAXone (ROCEPHIN)  IV Stopped (07/03/21 1947)   diltiazem (CARDIZEM) infusion 5 mg/hr (07/04/21 0402)    Assessment & Plan:   Principal Problem:   Pyelonephritis Active Problems:   Atrial fibrillation with RVR (HCC)   Pyelonephritis   abdominal pain- as evidenced by urinalysis and CT. Gross hematuria seen in canister  on admission 12/25 Follow-up urine culture Continue PPI, added Carafate since still with abdominal pain and endoscopy from last year revealed gastropathy. CT without any evidence of pancreatitis Pain control     Afib rvr likely exacerbated by acute infection.  12/25 continue Eliquis  Continue Cardizem p.o. and wean off of Cardizem drip  Continue bisoprolol  -TSH Normal    HTN-  Stable Continue bisoprolol and Cardizem       Anxiety/depression-chronic, stable Continue home meds BuSpar and Cymbalta Xanax as needed      Malignant breast cancer- diagnosed 01/2021. - chemotherapy to be directed by oncology outpatient - supportive care for chemotherapy side effects including antiemetics     OSA- chronic, stable - CPAP QHS      DVT prophylaxis: Eliquis Code Status: Full Family Communication: None at bedside Disposition Plan:  Status is: Inpatient  Remains inpatient appropriate because: IV treatment            LOS: 1 day   Time spent: 35 minutes with more than 50% on [place]jo, MD [facility] Pager [phone]  If 7PM-7AM, please contact night-coverage 07/04/2021, 2:09 PM

## 2021-07-05 ENCOUNTER — Other Ambulatory Visit: Payer: Self-pay | Admitting: Cardiovascular Disease

## 2021-07-05 DIAGNOSIS — N1330 Unspecified hydronephrosis: Secondary | ICD-10-CM

## 2021-07-05 DIAGNOSIS — R310 Gross hematuria: Secondary | ICD-10-CM

## 2021-07-05 DIAGNOSIS — N12 Tubulo-interstitial nephritis, not specified as acute or chronic: Secondary | ICD-10-CM | POA: Diagnosis not present

## 2021-07-05 LAB — HEMOGLOBIN AND HEMATOCRIT, BLOOD
HCT: 29.2 % — ABNORMAL LOW (ref 36.0–46.0)
Hemoglobin: 9.8 g/dL — ABNORMAL LOW (ref 12.0–15.0)

## 2021-07-05 MED ORDER — SODIUM CHLORIDE 0.9% FLUSH
10.0000 mL | Freq: Two times a day (BID) | INTRAVENOUS | Status: DC
  Administered 2021-07-05 – 2021-07-06 (×2): 10 mL via INTRAVENOUS

## 2021-07-05 MED ORDER — SODIUM CHLORIDE 0.9 % IV SOLN
2.0000 g | INTRAVENOUS | Status: DC
  Administered 2021-07-05 – 2021-07-06 (×2): 2 g via INTRAVENOUS
  Filled 2021-07-05: qty 20
  Filled 2021-07-05 (×2): qty 2

## 2021-07-05 MED ORDER — ACETAMINOPHEN 325 MG PO TABS: 650.0000 mg | ORAL_TABLET | Freq: Four times a day (QID) | ORAL | Status: DC | PRN

## 2021-07-05 MED ORDER — DILTIAZEM HCL 60 MG PO TABS
60.0000 mg | ORAL_TABLET | Freq: Four times a day (QID) | ORAL | Status: DC
  Administered 2021-07-05 – 2021-07-07 (×8): 60 mg via ORAL
  Filled 2021-07-05 (×11): qty 1

## 2021-07-05 MED ORDER — ACETAMINOPHEN 650 MG RE SUPP
650.0000 mg | Freq: Four times a day (QID) | RECTAL | Status: DC | PRN
  Filled 2021-07-05: qty 1

## 2021-07-05 NOTE — Consult Note (Signed)
H&P Physician requesting consult: Dr. Nolberto Hanlon  Chief Complaint: Gross hematuria  History of Present Illness: 70 year old female with history of breast cancer currently receiving chemotherapy presented with atrial fibrillation.  She originally presented with epigastric pain radiating towards the back.  She was found to have atrial fibrillation with RVR which was symptomatic but she was hemodynamically stable.  She was also noted to have gross hematuria.  She underwent a CT of the abdomen and pelvis with contrast on 07/02/2021 that revealed mild left hydronephrosis with some left-sided perinephric fat stranding and a small amount of perinephric fluid.  She had a cyst in the midpole of the left kidney that had decreased in size significantly possibly representing a cyst rupture.  Bladder was unremarkable.  No obvious ureteral calculi.  Patient denies any prior gross hematuria.  She states she has been voiding well without pain or discomfort.  Denies any flank pain.  Hemoglobin was noted to be 9.8 from 11.2 yesterday.  Creatinine normal at 0.56 on the 24th.  She has had no leukocytosis.  She has been on ceftriaxone for possible pyelonephritis.  No urine culture was sent that I can see.  She however denies any voiding complaints such as dysuria or urinary frequency/urgency.  She denies any flank pain as well.  She is on Eliquis.  Last dose was this morning.  Past Medical History:  Diagnosis Date   Achilles tendinitis    Acute medial meniscal tear    Allergy    Anxiety    Atrial fibrillation ([place])    a. new onset 07/2016; b. CHADS2VASc --> 4 (HTN, female, age, CHF); c. eliquis   CHF (congestive heart failure) (HCC)    Chronic anticoagulation    Apixaban   Chronic lower back pain    Colon polyp    Complication of anesthesia    Emergence delirium; hypoxia   COPD (chronic obstructive pulmonary disease) ([place])    Cystic breast    Depression    Endometriosis    Fibrocystic breast disease    GERD  (gastroesophageal reflux disease)    History of stress test    a. 07/2016 MV: EF 55-65%. Small, mild defect  in mid anteroseptal, apical septal, and apical locations. No ischemia-->low risk.   Hyperlipidemia    Hypertension    Insomnia    Lipoma    Malignant neoplasm of upper-outer quadrant of right female breast (HCC)    Stage 1A invasive mammary carcinoma (cT1b or T1c N0); ER (-), PR (+), HER2/neu (+)   Migraine    Mild Mitral regurgitation    a. 07/2016 Echo: EF 55-65%, no rwma, mild MR. Mildly dil LA. Nl RV fx.    OSA (obstructive sleep apnea)    non-compliant with nocturnal PAP therapy   Osteoarthritis    left knee   Osteopenia    Peptic ulcer disease    a. H. pylori   Pneumonia    RLS (restless legs syndrome)    Tendonitis    Thyroid disease    Vitamin D deficiency    Past Surgical History:  Procedure Laterality Date   ABDOMINAL HYSTERECTOMY     ovaries left   ACHILLES TENDON SURGERY     2012,01/2017   BACK SURGERY     fusion lumbar   BREAST BIOPSY Right 01/15/2021   Affirm biopsy-"X" clip-INVASIVE MAMMARY CARCINOMA   BREAST BIOPSY Right 01/15/2021   u/s bx-"venus" clip INVASIVE MAMMARY CARCINOMA   BREAST BIOPSY     BREAST LUMPECTOMY,RADIO FREQ LOCALIZER,AXILLARY  SENTINEL LYMPH NODE BIOPSY Right 02/17/2021   Procedure: BREAST LUMPECTOMY,RADIO FREQ LOCALIZER,AXILLARY SENTINEL LYMPH NODE BIOPSY;  Surgeon: Ronny Bacon, MD;  Location: [facility];  Service: General;  Laterality: Right;   CARDIOVERSION N/A 09/07/2016   Procedure: CARDIOVERSION;  Surgeon: Minna Merritts, MD;  Location: [facility];  Service: Cardiovascular;  Laterality: N/A;   CARDIOVERSION N/A 10/19/2016   Procedure: CARDIOVERSION;  Surgeon: Minna Merritts, MD;  Location: [facility];  Service: Cardiovascular;  Laterality: N/A;   CARDIOVERSION N/A 12/14/2016   Procedure: CARDIOVERSION;  Surgeon: Minna Merritts, MD;  Location: [facility];  Service: Cardiovascular;  Laterality: N/A;   COLONOSCOPY      COLONOSCOPY WITH PROPOFOL N/A 05/13/2020   Procedure: COLONOSCOPY WITH PROPOFOL;  Surgeon: Toledo, Benay Pike, MD;  Location: [facility];  Service: Gastroenterology;  Laterality: N/A;  ELIQUIS   ESOPHAGOGASTRODUODENOSCOPY     ESOPHAGOGASTRODUODENOSCOPY (EGD) WITH PROPOFOL N/A 05/13/2020   Procedure: ESOPHAGOGASTRODUODENOSCOPY (EGD) WITH PROPOFOL;  Surgeon: Toledo, Benay Pike, MD;  Location: [facility];  Service: Gastroenterology;  Laterality: N/A;   FOOT SURGERY Left    tendon   JOINT REPLACEMENT Right    hip   PORTA CATH INSERTION N/A 03/23/2021   Procedure: PORTA CATH INSERTION;  Surgeon: Katha Cabal, MD;  Location: [facility];  Service: Cardiovascular;  Laterality: N/A;   PORTACATH PLACEMENT N/A 03/23/2021   Procedure: ATTEMPTED INSERTION PORT-A-CATH;  Surgeon: Ronny Bacon, MD;  Location: [facility];  Service: General;  Laterality: N/A;   THYROIDECTOMY     thyroid lobectomy secondary to a benign nodule   TOTAL HIP ARTHROPLASTY Right 08/21/2018   Procedure: TOTAL HIP ARTHROPLASTY ANTERIOR APPROACH-RIGHT CELL SAVER REQUESTED;  Surgeon: [name], MD;  Location: [facility];  Service: Orthopedics;  Laterality: Right;    Home Medications:  Medications Prior to Admission  Medication Sig Dispense Refill Last Dose   acetaminophen (TYLENOL) 500 MG tablet Take 500-1,000 mg by mouth every 6 (six) hours as needed for mild pain or moderate pain.   Unknown at PRN   acetaminophen-codeine (TYLENOL #3) 300-30 MG tablet Take 1 tablet by mouth every 6 (six) hours as needed for moderate pain or severe pain. 60 tablet 0 Unknown at PRN   ALPRAZolam (XANAX) 0.25 MG tablet Take 0.25 mg by mouth at bedtime as needed for anxiety or sleep.   Past Week at PRN   azelastine (ASTELIN) 0.1 % nasal spray Place 1-2 sprays into both nostrils daily as needed for rhinitis.   Unknown at PRN   B Complex Vitamins (VITAMIN B COMPLEX PO) Take 1 tablet by mouth daily.       bisoprolol (ZEBETA) 5 MG  tablet Take 1 tablet (5 mg total) by mouth 2 (two) times daily. 180 tablet 1 07/01/2021 at 2000   busPIRone (BUSPAR) 5 MG tablet TAKE 1 TABLET BY MOUTH THREE TIMES A DAY AS NEEDED 60 tablet 1 Unknown at PRN   Cholecalciferol (VITAMIN D3) 5000 units CAPS Take 5,000 Units by mouth daily.      DULoxetine (CYMBALTA) 30 MG capsule Take 1 capsule (30 mg total) by mouth 2 (two) times daily. Home med.   07/01/2021 at 2000   ELIQUIS 5 MG TABS tablet TAKE 1 TABLET BY MOUTH TWICE A DAY 180 tablet 1 07/01/2021 at 2000   esomeprazole (NEXIUM) 20 MG capsule Take 20 mg by mouth daily as needed (GERD symptoms).   Unknown at PRN   furosemide (LASIX) 20 MG tablet Take 1 tablet (20 mg total) by mouth  daily. (Patient taking differently: Take 20 mg by mouth daily. (May take additional tablet daily if needed for swelling)) 90 tablet 0 07/01/2021 at 0800   lidocaine-prilocaine (EMLA) cream Apply to affected area once 30 g 3    loperamide (IMODIUM) 2 MG capsule Take 1 capsule (2 mg total) by mouth See admin instructions. Initial: 4 mg, followed by 2 mg after each loose stool; maximum: 16 mg/day 60 capsule 1 Unknown at PRN   magic mouthwash SOLN Take 5 mLs by mouth 2 (two) times daily. 480 mL 1    Multiple Vitamin (MULTIVITAMIN) tablet Take 1 tablet by mouth daily.      nystatin (MYCOSTATIN/NYSTOP) powder Apply 1 application topically 3 (three) times daily. 15 g 1    ondansetron (ZOFRAN) 8 MG tablet Take 1 tablet (8 mg total) by mouth every 8 (eight) hours as needed for nausea or vomiting. 60 tablet 0 Unknown at PRN   potassium chloride SA (KLOR-CON) 20 MEQ tablet Take 1 tablet (20 mEq total) by mouth 2 (two) times daily. (Patient taking differently: Take 20-40 mEq by mouth daily.) 120 tablet 2 07/01/2021 at Unknown   prochlorperazine (COMPAZINE) 10 MG tablet Take 1 tablet (10 mg total) by mouth every 6 (six) hours as needed (Nausea or vomiting). 30 tablet 1 Unknown at PRN   promethazine (PHENERGAN) 25 MG tablet Take 1 tablet  (25 mg total) by mouth every 6 (six) hours as needed for nausea or vomiting. 60 tablet 0 Unknown at PRN   ranolazine (RANEXA) 500 MG 12 hr tablet TAKE 1 TABLET BY MOUTH TWICE A DAY 180 tablet 0 07/01/2021 at 2000   rizatriptan (MAXALT) 10 MG tablet TAKE 1 TABLET (10 MG TOTAL) BY MOUTH AS NEEDED FOR MIGRAINE. MAY REPEAT IN 2 HOURS IF NEEDED 10 tablet 0 Unknown at PRN   rOPINIRole (REQUIP) 2 MG tablet Take 1 tablet (2 mg total) by mouth at bedtime. 90 tablet 1 07/01/2021 at 2000   rosuvastatin (CRESTOR) 20 MG tablet TAKE 1 TABLET BY MOUTH EVERY DAY 90 tablet 0 07/01/2021 at Unknown   Allergies:  Allergies  Allergen Reactions   Prednisone Other (See Comments)    Agitation, hyperactivity, polyphagia   Amiodarone Nausea And Vomiting   Hydrocodone-Acetaminophen Other (See Comments)    Hallucinations and combative   Oxycodone Itching    Can take it with benadryl   Tapentadol Itching   Tramadol Itching    Can take it with benadryl   Adhesive [Tape] Itching and Rash    Prefers paper tape   Latex Itching and Rash    use paper tap    Family History  Problem Relation Age of Onset   Cancer Sister 92       ovarian   Depression Sister    Cancer Brother        CLL   Cancer Father 35       colon   Heart disease Father    Heart attack Father 33       died from MI   Hyperlipidemia Father    Hypertension Father    Cancer Mother        leukemia   Breast cancer Paternal Aunt        53's   Migraines Neg Hx    Social History:  reports that she quit smoking about 17 years ago. Her smoking use included cigarettes. She has a 2.50 pack-year smoking history. She has never used smokeless tobacco. She reports that she does not currently use  alcohol after a past usage of about 1.0 standard drink per week. She reports that she does not use drugs.  ROS: A complete review of systems was performed.  All systems are negative except for pertinent findings as noted. ROS   Physical Exam:  Vital signs in  last 24 hours: Temp:  [97.6 F (36.4 C)-98 F (36.7 C)] 97.8 F (36.6 C) (12/26 1458) Pulse Rate:  [72-107] 87 (12/26 1458) Resp:  [16-20] 18 (12/26 1458) BP: (126-139)/(78-96) 131/85 (12/26 1458) SpO2:  [95 %-99 %] 98 % (12/26 1458) General:  Alert and oriented, No acute distress HEENT: Normocephalic, atraumatic Neck: No JVD or lymphadenopathy Cardiovascular: Regular rate and rhythm Lungs: Regular rate and effort Abdomen: Soft, nontender, nondistended, no abdominal masses Back: No CVA tenderness Extremities: No edema Neurologic: Grossly intact  Laboratory Data:  Results for orders placed or performed during the hospital encounter of 07/02/21 (from the past 24 hour(s))  Hemoglobin and hematocrit, blood     Status: Abnormal   Collection Time: 07/05/21  9:55 AM  Result Value Ref Range   Hemoglobin 9.8 (L) 12.0 - 15.0 g/dL   HCT 29.2 (L) 36.0 - 46.0 %   Recent Results (from the past 240 hour(s))  Resp Panel by RT-PCR (Flu A&B, Covid) Nasopharyngeal Swab     Status: None   Collection Time: 07/02/21  2:45 PM   Specimen: Nasopharyngeal Swab; Nasopharyngeal(NP) swabs in vial transport medium  Result Value Ref Range Status   SARS Coronavirus 2 by RT PCR NEGATIVE NEGATIVE Final    Comment: (NOTE) SARS-CoV-2 target nucleic acids are NOT DETECTED.  The SARS-CoV-2 RNA is generally detectable in upper respiratory specimens during the acute phase of infection. The lowest concentration of SARS-CoV-2 viral copies this assay can detect is 138 copies/mL. A negative result does not preclude SARS-Cov-2 infection and should not be used as the sole basis for treatment or other patient management decisions. A negative result may occur with  improper specimen collection/handling, submission of specimen other than nasopharyngeal swab, presence of viral mutation(s) within the areas targeted by this assay, and inadequate number of viral copies(<138 copies/mL). A negative result must be combined  with clinical observations, patient history, and epidemiological information. The expected result is Negative.  Fact Sheet for Patients:  EntrepreneurPulse.com.au  Fact Sheet for Healthcare Providers:  IncredibleEmployment.be  This test is no t yet approved or cleared by the [place] FDA and  has been authorized for detection and/or diagnosis of SARS-CoV-2 by FDA under an Emergency Use Authorization (EUA). This EUA will remain  in effect (meaning this test can be used) for the duration of the COVID-19 declaration under Section 564(b)(1) of the Act, 21 U.S.C.section 360bbb-3(b)(1), unless the authorization is terminated  or revoked sooner.       Influenza A by PCR NEGATIVE NEGATIVE Final   Influenza B by PCR NEGATIVE NEGATIVE Final    Comment: (NOTE) The Xpert Xpress SARS-CoV-2/FLU/RSV plus assay is intended as an aid in the diagnosis of influenza from Nasopharyngeal swab specimens and should not be used as a sole basis for treatment. Nasal washings and aspirates are unacceptable for Xpert Xpress SARS-CoV-2/FLU/RSV testing.  Fact Sheet for Patients: EntrepreneurPulse.com.au  Fact Sheet for Healthcare Providers: IncredibleEmployment.be  This test is not yet approved or cleared by the [place] FDA and has been authorized for detection and/or diagnosis of SARS-CoV-2 by FDA under an Emergency Use Authorization (EUA). This EUA will remain in effect (meaning this test can be used) for the duration  of the COVID-19 declaration under Section 564(b)(1) of the Act, 21 U.S.C. section 360bbb-3(b)(1), unless the authorization is terminated or revoked.  Performed at [facility], [address] [place], [place] [address]    Creatinine: Recent Labs    06/30/21 3685 07/02/21 1256 07/03/21 0711  CREATININE 0.88 0.77 0.56   CT scan personally reviewed and is detailed in the history of present  illness.  Impression/Assessment:  Gross hematuria Mild left hydronephrosis  Plan:  Agree with holding Eliquis at this time.  Continue to trend hemoglobin.  She is voiding without any retention so I do not think CBI is necessary at this time.  Should be able to transition to an oral antibiotic.  Unfortunately I do not think a urine culture was ever sent.  She will ultimately need a cystoscopy on an outpatient basis.  I would also recommend obtaining a CT hematuria protocol CT also outpatient to ensure resolution of hydronephrosis and fully evaluate kidneys/ureter given her gross hematuria.  Marton Redwood, III 07/05/2021, 5:56 PM

## 2021-07-05 NOTE — Progress Notes (Signed)
PROGRESS NOTE    Amber Barnes  YPP:[id number] DOB: Apr 18, 1951 DOA: 07/02/2021 PCP: [name], FNP    Brief Narrative:  Amber Barnes is a 70 y.o. female with a PMH significant for breast cancer currently receiving chemotherapy, pAfib, anxiety/depression, HTN, OSA, OA, COPD, CKD. They presented from home to the ED on 07/02/2021 with epigastric pain x1 days.  Patient states that she began having epigastric radiating to back pain this morning which gradually worsened throughout the day. She had one episode of emesis after onset but has not had any reoccurrence or nausea since then. She has had fluttering in her chest which is the typical sensation for her when she goes into Afib. She states this is common for her and she has been adherent with anticoagulation. She has not tried anything for it at home and decided to come to the ED after discussing with someone in her oncology office to rule our cardiac cause.  In the ED, it was found that they had signs of pyelonephritis on CT, urinalysis. She was in Afib with RVR in 110-120s and symptomatic but hemodynamically stable and afebrile. Metoprolol transiently improved her HR but returned to RVR so was started on dilt gtt in ED.   12/25 c/o abd pain. HR variable overall improving 12/26 carafate helped with abd pain. Still with hematauria/dark blood , no clots in tube today. Hg down 9.8. tele afib with burst of rvr.   Consultants:    Procedures:   Antimicrobials:  Ceftriaxone   Subjective: Epigastric pain decreased since on carafate. Urine canister with dark bloody urine. No sob, cp  Objective: Vitals:   07/04/21 2034 07/04/21 2351 07/05/21 0540 07/05/21 0748  BP: 127/86 139/87 133/82 126/78  Pulse: 92 98 (!) 107 75  Resp: 20 16 17 17   Temp: 98 F (36.7 C) 97.9 F (36.6 C) 97.8 F (36.6 C) 97.6 F (36.4 C)  TempSrc: Oral Oral Oral Oral  SpO2: 98% 97% 98% 95%  Weight:      Height:        Intake/Output Summary (Last  24 hours) at 07/05/2021 0903 Last data filed at 07/05/2021 0500 Gross per 24 hour  Intake 1641.14 ml  Output 800 ml  Net 841.14 ml   Filed Weights   07/02/21 1253  Weight: 119.5 kg    Examination: Calm, nad Cta no w/r Irreg s1/s2 no gallop Soft positive bowel sounds less epigastric pain on palpation No edema Awake oriented x3  Data Reviewed: I have personally reviewed following labs and imaging studies  CBC: Recent Labs  Lab 06/30/21 0826 07/02/21 1256 07/02/21 1646 07/03/21 0711 07/04/21 1033  WBC 9.5 10.1 10.1 4.8 4.8  NEUTROABS 4.5  --  7.9*  --   --   HGB 11.0* 11.3* 11.4* 11.5* 11.2*  HCT 33.1* 33.9* 33.3* 33.8* 32.4*  MCV 93.8 91.1 92.0 91.4 90.5  PLT 153 148* 148* 131* 458*   Basic Metabolic Panel: Recent Labs  Lab 06/30/21 0826 07/02/21 1256 07/03/21 0711  NA 138 137 138  K 3.4* 3.7 3.8  CL 104 108 105  CO2 24 24 26   GLUCOSE 108* 104* 88  BUN 11 19 18   CREATININE 0.88 0.77 0.56  CALCIUM 8.6* 8.7* 8.5*   GFR: Estimated Creatinine Clearance: 87.1 mL/min (by C-G formula based on SCr of 0.56 mg/dL). Liver Function Tests: Recent Labs  Lab 06/30/21 0826 07/02/21 1256  AST 19 20  ALT 15 15  ALKPHOS 66 59  BILITOT 0.9 0.8  PROT 6.5  6.8  ALBUMIN 3.6 3.8   Recent Labs  Lab 07/02/21 1256  LIPASE 24   No results for input(s): AMMONIA in the last 168 hours. Coagulation Profile: No results for input(s): INR, PROTIME in the last 168 hours. Cardiac Enzymes: No results for input(s): CKTOTAL, CKMB, CKMBINDEX, TROPONINI in the last 168 hours. BNP (last 3 results) No results for input(s): PROBNP in the last 8760 hours. HbA1C: No results for input(s): HGBA1C in the last 72 hours. CBG: No results for input(s): GLUCAP in the last 168 hours. Lipid Profile: No results for input(s): CHOL, HDL, LDLCALC, TRIG, CHOLHDL, LDLDIRECT in the last 72 hours. Thyroid Function Tests: Recent Labs    07/02/21 1516  TSH 2.060   Anemia Panel: No results for  input(s): VITAMINB12, FOLATE, FERRITIN, TIBC, IRON, RETICCTPCT in the last 72 hours. Sepsis Labs: No results for input(s): PROCALCITON, LATICACIDVEN in the last 168 hours.  Recent Results (from the past 240 hour(s))  Resp Panel by RT-PCR (Flu A&B, Covid) Nasopharyngeal Swab     Status: None   Collection Time: 07/02/21  2:45 PM   Specimen: Nasopharyngeal Swab; Nasopharyngeal(NP) swabs in vial transport medium  Result Value Ref Range Status   SARS Coronavirus 2 by RT PCR NEGATIVE NEGATIVE Final    Comment: (NOTE) SARS-CoV-2 target nucleic acids are NOT DETECTED.  The SARS-CoV-2 RNA is generally detectable in upper respiratory specimens during the acute phase of infection. The lowest concentration of SARS-CoV-2 viral copies this assay can detect is 138 copies/mL. A negative result does not preclude SARS-Cov-2 infection and should not be used as the sole basis for treatment or other patient management decisions. A negative result may occur with  improper specimen collection/handling, submission of specimen other than nasopharyngeal swab, presence of viral mutation(s) within the areas targeted by this assay, and inadequate number of viral copies(<138 copies/mL). A negative result must be combined with clinical observations, patient history, and epidemiological information. The expected result is Negative.  Fact Sheet for Patients:  EntrepreneurPulse.com.au  Fact Sheet for Healthcare Providers:  IncredibleEmployment.be  This test is no t yet approved or cleared by the [place] FDA and  has been authorized for detection and/or diagnosis of SARS-CoV-2 by FDA under an Emergency Use Authorization (EUA). This EUA will remain  in effect (meaning this test can be used) for the duration of the COVID-19 declaration under Section 564(b)(1) of the Act, 21 U.S.C.section 360bbb-3(b)(1), unless the authorization is terminated  or revoked sooner.        Influenza A by PCR NEGATIVE NEGATIVE Final   Influenza B by PCR NEGATIVE NEGATIVE Final    Comment: (NOTE) The Xpert Xpress SARS-CoV-2/FLU/RSV plus assay is intended as an aid in the diagnosis of influenza from Nasopharyngeal swab specimens and should not be used as a sole basis for treatment. Nasal washings and aspirates are unacceptable for Xpert Xpress SARS-CoV-2/FLU/RSV testing.  Fact Sheet for Patients: EntrepreneurPulse.com.au  Fact Sheet for Healthcare Providers: IncredibleEmployment.be  This test is not yet approved or cleared by the [place] FDA and has been authorized for detection and/or diagnosis of SARS-CoV-2 by FDA under an Emergency Use Authorization (EUA). This EUA will remain in effect (meaning this test can be used) for the duration of the COVID-19 declaration under Section 564(b)(1) of the Act, 21 U.S.C. section 360bbb-3(b)(1), unless the authorization is terminated or revoked.  Performed at [facility], [address], [place], [place] [address]          Radiology Studies: No results  found.      Scheduled Meds:  apixaban  5 mg Oral BID   bisoprolol  5 mg Oral BID   busPIRone  5 mg Oral TID   Chlorhexidine Gluconate Cloth  6 each Topical Daily   diltiazem  30 mg Oral Q6H   DULoxetine  30 mg Oral BID   magic mouthwash  5 mL Oral BID   pantoprazole  40 mg Oral BID   rOPINIRole  2 mg Oral QHS   rosuvastatin  20 mg Oral QHS   sucralfate  1 g Oral TID WC & HS   Continuous Infusions:  sodium chloride 75 mL/hr at 07/05/21 0152   cefTRIAXone (ROCEPHIN)  IV Stopped (07/04/21 2109)   diltiazem (CARDIZEM) infusion Stopped (07/04/21 1245)    Assessment & Plan:   Principal Problem:   Pyelonephritis Active Problems:   Atrial fibrillation with RVR (HCC)   Pyelonephritis   abdominal pain- as evidenced by urinalysis and CT. Gross hematuria seen in canister on admission and continues 12/26 urine  culture pending Continue PPI and Carafate for epigastric pain, will need to follow-up with her GI as outpatient as she had a EGD last year that revealed gastropathy CT scan done here please see report      Afib rvr likely exacerbated by acute infection.  12/26 rate has room for improvement Increase Cardizem to 60 mg every 6 hours DC Eliquis since with hematuria and hemoglobin dropped Continue bisoprolol TSH normal   Hematuria On going since admission H/h dropped mildly Wild hold eliquis for now [facility] for dvt ppx Monitor h/h  Likely may need outpatient cystoscopy if continues, will refer then to urology    HTN-  Stable Continue bisoprolol and Cardizem       Anxiety/depression-chronic, stable Continue home meds BuSpar and Cymbalta Xanax as needed      Malignant breast cancer- diagnosed 01/2021. - chemotherapy to be directed by oncology outpatient - supportive care for chemotherapy side effects including antiemetics     OSA- chronic, stable - CPAP QHS      DVT prophylaxis: scd Code Status: Full Family Communication: None at bedside Disposition Plan:  Status is: Inpatient  Remains inpatient appropriate because: IV treatment. H/h dropped, still with afib rvr. hematuria            LOS: 2 days   Time spent: 35 minutes with more than 50% on [place], MD [facility] Pager [phone]  If 7PM-7AM, please contact night-coverage 07/05/2021, 9:03 AM

## 2021-07-06 ENCOUNTER — Other Ambulatory Visit: Payer: Self-pay | Admitting: Physician Assistant

## 2021-07-06 DIAGNOSIS — N1330 Unspecified hydronephrosis: Secondary | ICD-10-CM

## 2021-07-06 DIAGNOSIS — R310 Gross hematuria: Secondary | ICD-10-CM

## 2021-07-06 DIAGNOSIS — N12 Tubulo-interstitial nephritis, not specified as acute or chronic: Secondary | ICD-10-CM | POA: Diagnosis not present

## 2021-07-06 LAB — CBC
HCT: 27.7 % — ABNORMAL LOW (ref 36.0–46.0)
Hemoglobin: 9.3 g/dL — ABNORMAL LOW (ref 12.0–15.0)
MCH: 30.8 pg (ref 26.0–34.0)
MCHC: 33.6 g/dL (ref 30.0–36.0)
MCV: 91.7 fL (ref 80.0–100.0)
Platelets: 130 10*3/uL — ABNORMAL LOW (ref 150–400)
RBC: 3.02 MIL/uL — ABNORMAL LOW (ref 3.87–5.11)
RDW: 13.4 % (ref 11.5–15.5)
WBC: 3.8 10*3/uL — ABNORMAL LOW (ref 4.0–10.5)
nRBC: 0 % (ref 0.0–0.2)

## 2021-07-06 NOTE — Care Management Important Message (Signed)
Important Message  Patient Details  Name: Keyunna Coco MRN: [id number] Date of Birth: 1951-03-10   Medicare Important Message Given:  N/A - LOS <3 / Initial given by admissions     Dannette Barbara 07/06/2021, 12:57 PM

## 2021-07-06 NOTE — Progress Notes (Signed)
Urology Inpatient Progress Note  Subjective: No acute events overnight. Hemoglobin slightly down today, 9.3.  Eliquis remains held. She is voiding spontaneously without difficulty.  Pure wick is in place with amber urine in the canister, no clots. Patient reports some intermittent epigastric pain that is improving.  She is curious about if she will receive her next scheduled chemotherapy treatment tomorrow.  Anti-infectives: Anti-infectives (From admission, onward)    Start     Dose/Rate Route Frequency Ordered Stop   07/05/21 1800  cefTRIAXone (ROCEPHIN) 2 g in sodium chloride 0.9 % 100 mL IVPB        2 g 200 mL/hr over 30 Minutes Intravenous Every 24 hours 07/05/21 1022     07/03/21 0000  cefTRIAXone (ROCEPHIN) 1 g in sodium chloride 0.9 % 100 mL IVPB  Status:  Discontinued        1 g 200 mL/hr over 30 Minutes Intravenous Every 24 hours 07/02/21 1821 07/02/21 1843   07/02/21 1900  cefTRIAXone (ROCEPHIN) 1 g in sodium chloride 0.9 % 100 mL IVPB  Status:  Discontinued        1 g 200 mL/hr over 30 Minutes Intravenous Every 24 hours 07/02/21 1843 07/05/21 1022   07/02/21 1700  cefTRIAXone (ROCEPHIN) 1 g in sodium chloride 0.9 % 100 mL IVPB  Status:  Discontinued        1 g 200 mL/hr over 30 Minutes Intravenous  Once 07/02/21 1646 07/02/21 1841       Current Facility-Administered Medications  Medication Dose Route Frequency Provider Last Rate Last Admin   0.9 %  sodium chloride infusion   Intravenous Continuous Nolberto Hanlon, MD 75 mL/hr at 07/06/21 0507 New Bag at 07/06/21 0507   acetaminophen (TYLENOL) tablet 650 mg  650 mg Oral Q6H PRN Oswald Hillock, RPH       Or   acetaminophen (TYLENOL) suppository 650 mg  650 mg Rectal Q6H PRN Oswald Hillock, RPH       albuterol (PROVENTIL) (2.5 MG/3ML) 0.083% nebulizer solution 2.5 mg  2.5 mg Nebulization Q8H PRN Richarda Osmond, MD       ALPRAZolam Duanne Moron) tablet 0.25 mg  0.25 mg Oral QHS PRN Richarda Osmond, MD   0.25 mg at 07/05/21  2343   azelastine (ASTELIN) 0.1 % nasal spray 1 spray  1 spray Each Nare PRN Richarda Osmond, MD       bisoprolol (ZEBETA) tablet 5 mg  5 mg Oral BID Nolberto Hanlon, MD   5 mg at 07/05/21 2131   busPIRone (BUSPAR) tablet 5 mg  5 mg Oral TID Richarda Osmond, MD   5 mg at 07/05/21 2131   cefTRIAXone (ROCEPHIN) 2 g in sodium chloride 0.9 % 100 mL IVPB  2 g Intravenous Q24H Oswald Hillock, RPH 200 mL/hr at 07/05/21 1731 2 g at 07/05/21 1731   Chlorhexidine Gluconate Cloth 2 % PADS 6 each  6 each Topical Daily Nolberto Hanlon, MD   6 each at 07/05/21 0948   diltiazem (CARDIZEM) tablet 60 mg  60 mg Oral Q6H Amery, Gwynneth Albright, MD   60 mg at 07/06/21 0514   DULoxetine (CYMBALTA) DR capsule 30 mg  30 mg Oral BID Richarda Osmond, MD   30 mg at 07/05/21 2130   loperamide (IMODIUM) capsule 2 mg  2 mg Oral PRN Richarda Osmond, MD       magic mouthwash  5 mL Oral BID Richarda Osmond, MD   5 mL at 07/03/21  2318   morphine 2 MG/ML injection 1 mg  1 mg Intravenous Q2H PRN Nolberto Hanlon, MD   1 mg at 07/06/21 0228   ondansetron (ZOFRAN) tablet 8 mg  8 mg Oral Q8H PRN Richarda Osmond, MD       pantoprazole (PROTONIX) EC tablet 40 mg  40 mg Oral BID Nolberto Hanlon, MD   40 mg at 07/05/21 2131   prochlorperazine (COMPAZINE) tablet 10 mg  10 mg Oral Q6H PRN Richarda Osmond, MD       promethazine (PHENERGAN) tablet 25 mg  25 mg Oral Q6H PRN Richarda Osmond, MD       rOPINIRole (REQUIP) tablet 2 mg  2 mg Oral QHS Doristine Mango L, MD   2 mg at 07/05/21 2131   rosuvastatin (CRESTOR) tablet 20 mg  20 mg Oral QHS Doristine Mango L, MD   20 mg at 07/05/21 2130   sodium chloride flush (NS) 0.9 % injection 10 mL  10 mL Intravenous Q12H Nolberto Hanlon, MD   10 mL at 07/05/21 2133   sucralfate (CARAFATE) 1 GM/10ML suspension 1 g  1 g Oral TID WC & HS Nolberto Hanlon, MD   1 g at 07/05/21 2130   Objective: Vital signs in last 24 hours: Temp:  [97.4 F (36.3 C)-98.4 F (36.9 C)] 98.4 F (36.9 C) (12/27  0515) Pulse Rate:  [69-91] 86 (12/27 0515) Resp:  [17-19] 17 (12/27 0515) BP: (121-139)/(59-96) 131/74 (12/27 0515) SpO2:  [96 %-99 %] 96 % (12/27 0515)  Intake/Output from previous day: 12/26 0701 - 12/27 0700 In: 1805.3 [I.V.:1805.3] Out: 1150 [Urine:1150] Intake/Output this shift: No intake/output data recorded.  Physical Exam Vitals and nursing note reviewed.  Constitutional:      General: She is not in acute distress.    Appearance: She is not ill-appearing, toxic-appearing or diaphoretic.  HENT:     Head: Normocephalic and atraumatic.  Pulmonary:     Effort: Pulmonary effort is normal. No respiratory distress.  Abdominal:     Palpations: Abdomen is soft.     Tenderness: There is abdominal tenderness (Epigastric). There is no guarding or rebound.  Skin:    General: Skin is warm and dry.  Neurological:     Mental Status: She is alert and oriented to person, place, and time.  Psychiatric:        Mood and Affect: Mood normal.        Behavior: Behavior normal.   Lab Results:  Recent Labs    07/04/21 1033 07/05/21 0955 07/06/21 0509  WBC 4.8  --  3.8*  HGB 11.2* 9.8* 9.3*  HCT 32.4* 29.2* 27.7*  PLT 139*  --  130*   Assessment & Plan: 70 year old female with breast cancer currently undergoing chemotherapy admitted with A. fib with RVR and gross hematuria with CT findings of a possible ruptured left renal cyst.  Anticoagulation being held.  Her epigastric pain is improving and hemoglobin is rather stable today.  No significant gross hematuria noted on visual inspection of her urine.  No indication for urgent urologic intervention at this time.  We discussed that she will require outpatient hematuria work-up with CT urogram and cystoscopy.  I will arrange for this today.  Okay to proceed with scheduled chemotherapy tomorrow from the urologic perspective.  Okay to resume anticoagulation in 2 to 3 days once her urine is cleared and blood counts remained  stable.  Debroah Loop, PA-C 07/06/2021

## 2021-07-06 NOTE — Progress Notes (Signed)
PROGRESS NOTE    Amber Barnes  TGG:[id number] DOB: Dec 12, 1950 DOA: 07/02/2021 PCP: [name], FNP    Brief Narrative:  Amber Barnes is a 70 y.o. female with a PMH significant for breast cancer currently receiving chemotherapy, pAfib, anxiety/depression, HTN, OSA, OA, COPD, CKD. They presented from home to the ED on 07/02/2021 with epigastric pain x1 days.  Patient states that she began having epigastric radiating to back pain this morning which gradually worsened throughout the day. She had one episode of emesis after onset but has not had any reoccurrence or nausea since then. She has had fluttering in her chest which is the typical sensation for her when she goes into Afib. She states this is common for her and she has been adherent with anticoagulation. She has not tried anything for it at home and decided to come to the ED after discussing with someone in her oncology office to rule our cardiac cause.  In the ED, it was found that they had signs of pyelonephritis on CT, urinalysis. She was in Afib with RVR in 110-120s and symptomatic but hemodynamically stable and afebrile. Metoprolol transiently improved her HR but returned to RVR so was started on dilt gtt in ED.   12/25 c/o abd pain. HR variable overall improving 12/26 carafate helped with abd pain. Still with hematauria/dark blood , no clots in tube today. Hg down 9.8. tele afib with burst of rvr.  12/27 still with quite a bit of hematuria urine canister.  Urology was consulted.  Supposed to have chemo tomorrow and have chatted Dr. Tasia Catchings her oncologist to see if patient can go there for chemo if she remains inpatient. Telemetry with improved heart rate  Consultants:  Urology  Procedures:   Antimicrobials:  Ceftriaxone   Subjective: Epigastric pain better. No n/v/sob  Objective: Vitals:   07/05/21 1823 07/05/21 2341 07/05/21 2342 07/06/21 0515  BP: (!) 121/59 139/89 139/89 131/74  Pulse: 86 91 69 86  Resp: 17  19 19 17   Temp: (!) 97.4 F (36.3 C) 98 F (36.7 C) 98 F (36.7 C) 98.4 F (36.9 C)  TempSrc: Oral Oral    SpO2: 96% 99% 99% 96%  Weight:      Height:        Intake/Output Summary (Last 24 hours) at 07/06/2021 0817 Last data filed at 07/06/2021 0300 Gross per 24 hour  Intake 1805.26 ml  Output 1150 ml  Net 655.26 ml   Filed Weights   07/02/21 1253  Weight: 119.5 kg    Examination: Calm, nad Cta no w/r Irreg s1/s2 no gallops Soft benign positive bowel sounds  no edema And alert x3    Data Reviewed: I have personally reviewed following labs and imaging studies  CBC: Recent Labs  Lab 06/30/21 0826 07/02/21 1256 07/02/21 1646 07/03/21 0711 07/04/21 1033 07/05/21 0955 07/06/21 0509  WBC 9.5 10.1 10.1 4.8 4.8  --  3.8*  NEUTROABS 4.5  --  7.9*  --   --   --   --   HGB 11.0* 11.3* 11.4* 11.5* 11.2* 9.8* 9.3*  HCT 33.1* 33.9* 33.3* 33.8* 32.4* 29.2* 27.7*  MCV 93.8 91.1 92.0 91.4 90.5  --  91.7  PLT 153 148* 148* 131* 139*  --  703*   Basic Metabolic Panel: Recent Labs  Lab 06/30/21 0826 07/02/21 1256 07/03/21 0711  NA 138 137 138  K 3.4* 3.7 3.8  CL 104 108 105  CO2 24 24 26   GLUCOSE 108* 104* 88  BUN 11 19 18   CREATININE 0.88 0.77 0.56  CALCIUM 8.6* 8.7* 8.5*   GFR: Estimated Creatinine Clearance: 87.1 mL/min (by C-G formula based on SCr of 0.56 mg/dL). Liver Function Tests: Recent Labs  Lab 06/30/21 0826 07/02/21 1256  AST 19 20  ALT 15 15  ALKPHOS 66 59  BILITOT 0.9 0.8  PROT 6.5 6.8  ALBUMIN 3.6 3.8   Recent Labs  Lab 07/02/21 1256  LIPASE 24   No results for input(s): AMMONIA in the last 168 hours. Coagulation Profile: No results for input(s): INR, PROTIME in the last 168 hours. Cardiac Enzymes: No results for input(s): CKTOTAL, CKMB, CKMBINDEX, TROPONINI in the last 168 hours. BNP (last 3 results) No results for input(s): PROBNP in the last 8760 hours. HbA1C: No results for input(s): HGBA1C in the last 72 hours. CBG: No  results for input(s): GLUCAP in the last 168 hours. Lipid Profile: No results for input(s): CHOL, HDL, LDLCALC, TRIG, CHOLHDL, LDLDIRECT in the last 72 hours. Thyroid Function Tests: No results for input(s): TSH, T4TOTAL, FREET4, T3FREE, THYROIDAB in the last 72 hours.  Anemia Panel: No results for input(s): VITAMINB12, FOLATE, FERRITIN, TIBC, IRON, RETICCTPCT in the last 72 hours. Sepsis Labs: No results for input(s): PROCALCITON, LATICACIDVEN in the last 168 hours.  Recent Results (from the past 240 hour(s))  Resp Panel by RT-PCR (Flu A&B, Covid) Nasopharyngeal Swab     Status: None   Collection Time: 07/02/21  2:45 PM   Specimen: Nasopharyngeal Swab; Nasopharyngeal(NP) swabs in vial transport medium  Result Value Ref Range Status   SARS Coronavirus 2 by RT PCR NEGATIVE NEGATIVE Final    Comment: (NOTE) SARS-CoV-2 target nucleic acids are NOT DETECTED.  The SARS-CoV-2 RNA is generally detectable in upper respiratory specimens during the acute phase of infection. The lowest concentration of SARS-CoV-2 viral copies this assay can detect is 138 copies/mL. A negative result does not preclude SARS-Cov-2 infection and should not be used as the sole basis for treatment or other patient management decisions. A negative result may occur with  improper specimen collection/handling, submission of specimen other than nasopharyngeal swab, presence of viral mutation(s) within the areas targeted by this assay, and inadequate number of viral copies(<138 copies/mL). A negative result must be combined with clinical observations, patient history, and epidemiological information. The expected result is Negative.  Fact Sheet for Patients:  EntrepreneurPulse.com.au  Fact Sheet for Healthcare Providers:  IncredibleEmployment.be  This test is no t yet approved or cleared by the [place] FDA and  has been authorized for detection and/or diagnosis of SARS-CoV-2  by FDA under an Emergency Use Authorization (EUA). This EUA will remain  in effect (meaning this test can be used) for the duration of the COVID-19 declaration under Section 564(b)(1) of the Act, 21 U.S.C.section 360bbb-3(b)(1), unless the authorization is terminated  or revoked sooner.       Influenza A by PCR NEGATIVE NEGATIVE Final   Influenza B by PCR NEGATIVE NEGATIVE Final    Comment: (NOTE) The Xpert Xpress SARS-CoV-2/FLU/RSV plus assay is intended as an aid in the diagnosis of influenza from Nasopharyngeal swab specimens and should not be used as a sole basis for treatment. Nasal washings and aspirates are unacceptable for Xpert Xpress SARS-CoV-2/FLU/RSV testing.  Fact Sheet for Patients: EntrepreneurPulse.com.au  Fact Sheet for Healthcare Providers: IncredibleEmployment.be  This test is not yet approved or cleared by the [place] FDA and has been authorized for detection and/or diagnosis of SARS-CoV-2 by FDA under an Emergency Use  Authorization (EUA). This EUA will remain in effect (meaning this test can be used) for the duration of the COVID-19 declaration under Section 564(b)(1) of the Act, 21 U.S.C. section 360bbb-3(b)(1), unless the authorization is terminated or revoked.  Performed at [facility], [address], [place], [place] [address]          Radiology Studies: No results found.      Scheduled Meds:  bisoprolol  5 mg Oral BID   busPIRone  5 mg Oral TID   Chlorhexidine Gluconate Cloth  6 each Topical Daily   diltiazem  60 mg Oral Q6H   DULoxetine  30 mg Oral BID   magic mouthwash  5 mL Oral BID   pantoprazole  40 mg Oral BID   rOPINIRole  2 mg Oral QHS   rosuvastatin  20 mg Oral QHS   sodium chloride flush  10 mL Intravenous Q12H   sucralfate  1 g Oral TID WC & HS   Continuous Infusions:  sodium chloride 75 mL/hr at 07/06/21 0507   cefTRIAXone (ROCEPHIN)  IV 2 g (07/05/21 1731)     Assessment & Plan:   Principal Problem:   Pyelonephritis Active Problems:   Atrial fibrillation with RVR (HCC)   Pyelonephritis   abdominal pain- as evidenced by urinalysis and CT. Gross hematuria seen in canister on admission and continues Continue PPI and Carafate for epigastric pain, will need to follow-up with her GI as outpatient as she had a EGD last year that revealed gastropathy CT scan done here please see report 12/27 does not look like urine culture was ever sent Continue IV ceftriaxone      Afib rvr likely exacerbated by acute infection.  12/27 heart rate better controlled with increase Cardizem  Eliquis held due to hematuria  continue bisoprolol TSH normal Can switch her to long-acting Cardizem on discharge    Hematuria On going since admission H/h dropped mildly 12/27 Eliquis held Urology's input was appreciated.  Will need outpatient cystoscopy, and repeat CT hematuria protocol which can be done as outpatient as well to ensure resolution of hydronephrosis given her gross hematuria.  Urology will arrange. No urologic intervention at this time Okay to proceed with her scheduled chemotherapy tomorrow from urologic perspective. Okay to resume anticoagulation in 2 to 3 days once her urine is cleared and blood counts remained stable      HTN-  Stable continue bisoprolol and Cardizem       Anxiety/depression-chronic, stable Continue home meds BuSpar and Cymbalta Xanax as needed      Malignant breast cancer- diagnosed 01/2021. - chemotherapy to be directed by oncology outpatient - supportive care for chemotherapy side effects including antiemetics     OSA- chronic, stable - CPAP QHS      DVT prophylaxis: scd Code Status: Full Family Communication: None at bedside Disposition Plan:  Status is: Inpatient  Remains inpatient appropriate because: IV treatment. H/h dropped, still with afib rvr. Hematuria ongoing            LOS: 3  days   Time spent: 35 minutes with more than 50% on [place], MD [facility] Pager [phone]  If 7PM-7AM, please contact night-coverage 07/06/2021, 8:17 AM

## 2021-07-07 ENCOUNTER — Inpatient Hospital Stay: Payer: Medicare Other

## 2021-07-07 ENCOUNTER — Inpatient Hospital Stay: Payer: Medicare Other | Admitting: Oncology

## 2021-07-07 ENCOUNTER — Encounter: Payer: Self-pay | Admitting: Oncology

## 2021-07-07 LAB — CBC WITH DIFFERENTIAL/PLATELET
Abs Immature Granulocytes: 0.02 10*3/uL (ref 0.00–0.07)
Basophils Absolute: 0 10*3/uL (ref 0.0–0.1)
Basophils Relative: 1 %
Eosinophils Absolute: 0 10*3/uL (ref 0.0–0.5)
Eosinophils Relative: 1 %
HCT: 27.7 % — ABNORMAL LOW (ref 36.0–46.0)
Hemoglobin: 9.5 g/dL — ABNORMAL LOW (ref 12.0–15.0)
Immature Granulocytes: 1 %
Lymphocytes Relative: 34 %
Lymphs Abs: 1.1 10*3/uL (ref 0.7–4.0)
MCH: 31.3 pg (ref 26.0–34.0)
MCHC: 34.3 g/dL (ref 30.0–36.0)
MCV: 91.1 fL (ref 80.0–100.0)
Monocytes Absolute: 0.1 10*3/uL (ref 0.1–1.0)
Monocytes Relative: 2 %
Neutro Abs: 2.1 10*3/uL (ref 1.7–7.7)
Neutrophils Relative %: 61 %
Platelets: 156 10*3/uL (ref 150–400)
RBC: 3.04 MIL/uL — ABNORMAL LOW (ref 3.87–5.11)
RDW: 13.3 % (ref 11.5–15.5)
WBC: 3.4 10*3/uL — ABNORMAL LOW (ref 4.0–10.5)
nRBC: 0 % (ref 0.0–0.2)

## 2021-07-07 MED ORDER — CIPROFLOXACIN HCL 500 MG PO TABS: 500.0000 mg | ORAL_TABLET | Freq: Two times a day (BID) | ORAL | 0 refills | Status: DC

## 2021-07-07 MED ORDER — APIXABAN 5 MG PO TABS: 5.0000 mg | ORAL_TABLET | Freq: Two times a day (BID) | ORAL | 1 refills | Status: DC

## 2021-07-07 MED ORDER — DILTIAZEM HCL ER COATED BEADS 240 MG PO CP24: 240.0000 mg | ORAL_CAPSULE | Freq: Every day | ORAL | 2 refills | Status: DC

## 2021-07-07 MED ORDER — SODIUM CHLORIDE 0.9 % IV SOLN
2.0000 g | Freq: Once | INTRAVENOUS | Status: AC
  Administered 2021-07-07: 16:00:00 2 g via INTRAVENOUS
  Filled 2021-07-07: qty 2

## 2021-07-07 NOTE — Discharge Instructions (Signed)
Amber Barnes,  You were in the hospital with a UTI and possible kidney infection. You also had a fast heart rate on top of your atrial fibrillation. You will be discharged with antibiotics and a new prescription of Cardizem for your atrial fibrillation. Please follow-up with your cardiologist. Regarding your cancer, I discussed with Dr. Tasia Catchings and she plans to continue chemotherapy once you have overcome your acute illness.

## 2021-07-07 NOTE — Discharge Summary (Signed)
Physician Discharge Summary  Amber Barnes IOE:[id number] DOB: July 16, 1950 DOA: 07/02/2021  PCP: [name], FNP  Admit date: 07/02/2021 Discharge date: 07/07/2021  Admitted From: Home Disposition: Home  Recommendations for Outpatient Follow-up:  Follow up with PCP in 1 week Follow up with urology and medical oncology Please obtain BMP/CBC in one week Please follow up on the following pending results: None  Discharge Condition: Stable CODE STATUS: Full code Diet recommendation: Regular diet   Brief/Interim Summary:  Admission HPI written by Richarda Osmond, MD   HPI:  Amber Barnes is a 70 y.o. female with a PMH significant for breast cancer currently receiving chemotherapy, pAfib, anxiety/depression, HTN, OSA, OA, COPD, CKD. They presented from home to the ED on 07/02/2021 with epigastric pain x1 days.  Patient states that she began having epigastric radiating to back pain this morning which gradually worsened throughout the day. She had one episode of emesis after onset but has not had any reoccurrence or nausea since then. She has had fluttering in her chest which is the typical sensation for her when she goes into Afib. She states this is common for her and she has been adherent with anticoagulation. She has not tried anything for it at home and decided to come to the ED after discussing with someone in her oncology office to rule our cardiac cause. She endorses red urine today which she thinks is due to drinking cranberry juice yesterday. Denies frequency, urgency, dysuria.  In the ED, it was found that they had signs of pyelonephritis on CT, urinalysis. She was in Afib with RVR in 110-120s and symptomatic but hemodynamically stable and afebrile. Metoprolol transiently improved her HR but returned to RVR so was started on dilt gtt in ED.  For the pyelonephritis, she was started on CTX.  IV morphine greatly improved her pain.  Patient was admitted to medicine  service for further workup and management of Afib RVR and pyelonephritis as outlined in detail below.    Hospital course:  Pyelonephritis Abdominal pain Patient managed empirically on Ceftriaxone IV. No urine culture or blood cultures obtained on admission. Patient received six days of Ceftriaxone IV inpatient and was transitioned to Ciprofloxacin to complete a total of 10 days of antibiotics.  Atrial fibrillation with RVR In setting of acute illness. Cardizem increased with better rate control. Eliquis held as mentioned below. Transitioned to Cardizem 240 mg daily on discharge  Hematuria Eliquis held. In setting of pyelonephritis/UTI. Urology consulted. No inpatient management. Hematuria improved. Recommendation to restart Eliquis 2 days post-discharge. Patient to follow-up with urology as an outpatient.  Anxiety Depression Continue Buspar, Cymbalta and Xanax  Malignant breast cancer Patient follows with oncology as an outpatient. Plan to resume chemotherapy once acute illness resolved.  Obstructive sleep apnea CPAP nightly  Discharge Diagnoses:  Principal Problem:   Pyelonephritis Active Problems:   Atrial fibrillation with RVR [facility])    Discharge Instructions  Discharge Instructions     Amb referral to AFIB Clinic   Complete by: As directed       Allergies as of 07/07/2021       Reactions   Prednisone Other (See Comments)   Agitation, hyperactivity, polyphagia   Amiodarone Nausea And Vomiting   Hydrocodone-acetaminophen Other (See Comments)   Hallucinations and combative   Oxycodone Itching   Can take it with benadryl   Tapentadol Itching   Tramadol Itching   Can take it with benadryl   Adhesive [tape] Itching, Rash   Prefers paper  tape   Latex Itching, Rash   use paper tap        Medication List     TAKE these medications    acetaminophen 500 MG tablet Commonly known as: TYLENOL Take 500-1,000 mg by mouth every 6 (six) hours as needed for mild  pain or moderate pain.   acetaminophen-codeine 300-30 MG tablet Commonly known as: TYLENOL #3 Take 1 tablet by mouth every 6 (six) hours as needed for moderate pain or severe pain.   ALPRAZolam 0.25 MG tablet Commonly known as: XANAX Take 0.25 mg by mouth at bedtime as needed for anxiety or sleep.   apixaban 5 MG Tabs tablet Commonly known as: Eliquis Take 1 tablet (5 mg total) by mouth 2 (two) times daily. Start on 07/09/2021 What changed:  how much to take additional instructions   azelastine 0.1 % nasal spray Commonly known as: ASTELIN Place 1-2 sprays into both nostrils daily as needed for rhinitis.   bisoprolol 5 MG tablet Commonly known as: ZEBETA Take 1 tablet (5 mg total) by mouth 2 (two) times daily.   busPIRone 5 MG tablet Commonly known as: BUSPAR TAKE 1 TABLET BY MOUTH THREE TIMES A DAY AS NEEDED   ciprofloxacin 500 MG tablet Commonly known as: Cipro Take 1 tablet (500 mg total) by mouth 2 (two) times daily for 4 days.   diltiazem 240 MG 24 hr capsule Commonly known as: Cardizem CD Take 1 capsule (240 mg total) by mouth daily.   DULoxetine 30 MG capsule Commonly known as: CYMBALTA Take 1 capsule (30 mg total) by mouth 2 (two) times daily. Home med.   esomeprazole 20 MG capsule Commonly known as: NEXIUM Take 20 mg by mouth daily as needed (GERD symptoms).   furosemide 20 MG tablet Commonly known as: LASIX Take 1 tablet (20 mg total) by mouth daily. What changed: additional instructions   lidocaine-prilocaine cream Commonly known as: EMLA Apply to affected area once   loperamide 2 MG capsule Commonly known as: IMODIUM Take 1 capsule (2 mg total) by mouth See admin instructions. Initial: 4 mg, followed by 2 mg after each loose stool; maximum: 16 mg/day   magic mouthwash Soln Take 5 mLs by mouth 2 (two) times daily.   multivitamin tablet Take 1 tablet by mouth daily.   nystatin powder Commonly known as: MYCOSTATIN/NYSTOP Apply 1 application  topically 3 (three) times daily.   ondansetron 8 MG tablet Commonly known as: ZOFRAN Take 1 tablet (8 mg total) by mouth every 8 (eight) hours as needed for nausea or vomiting.   potassium chloride SA 20 MEQ tablet Commonly known as: KLOR-CON M Take 1 tablet (20 mEq total) by mouth 2 (two) times daily. What changed:  how much to take when to take this   prochlorperazine 10 MG tablet Commonly known as: COMPAZINE Take 1 tablet (10 mg total) by mouth every 6 (six) hours as needed (Nausea or vomiting).   promethazine 25 MG tablet Commonly known as: PHENERGAN Take 1 tablet (25 mg total) by mouth every 6 (six) hours as needed for nausea or vomiting.   ranolazine 500 MG 12 hr tablet Commonly known as: RANEXA TAKE 1 TABLET BY MOUTH TWICE A DAY   rizatriptan 10 MG tablet Commonly known as: MAXALT TAKE 1 TABLET (10 MG TOTAL) BY MOUTH AS NEEDED FOR MIGRAINE. MAY REPEAT IN 2 HOURS IF NEEDED   rOPINIRole 2 MG tablet Commonly known as: Requip Take 1 tablet (2 mg total) by mouth at bedtime.  rosuvastatin 20 MG tablet Commonly known as: CRESTOR TAKE 1 TABLET BY MOUTH EVERY DAY   VITAMIN B COMPLEX PO Take 1 tablet by mouth daily.   Vitamin D3 125 MCG (5000 UT) Caps Take 5,000 Units by mouth daily.        Allergies  Allergen Reactions   Prednisone Other (See Comments)    Agitation, hyperactivity, polyphagia   Amiodarone Nausea And Vomiting   Hydrocodone-Acetaminophen Other (See Comments)    Hallucinations and combative   Oxycodone Itching    Can take it with benadryl   Tapentadol Itching   Tramadol Itching    Can take it with benadryl   Adhesive [Tape] Itching and Rash    Prefers paper tape   Latex Itching and Rash    use paper tap    Consultations: Urology Medical oncology   Procedures/Studies: DG Chest 2 View  Result Date: 07/02/2021 CLINICAL DATA:  Chest pain and abdominal pain. EXAM: CHEST - 2 VIEW COMPARISON:  04/05/2021 and CT chest 08/23/2018. FINDINGS:  Trachea is midline. Heart is enlarged. Left IJ power port tip is in the SVC. Lungs are clear. No pleural fluid. IMPRESSION: No acute findings. Electronically Signed   By: Lorin Picket M.D.   On: 07/02/2021 13:22   CT Angio Chest PE W/Cm &/Or Wo Cm  Result Date: 07/02/2021 CLINICAL DATA:  Upper abdominal and chest pain. High probability for PE. EXAM: CT ANGIOGRAPHY CHEST WITH CONTRAST TECHNIQUE: Multidetector CT imaging of the chest was performed using the standard protocol during bolus administration of intravenous contrast. Multiplanar CT image reconstructions and MIPs were obtained to evaluate the vascular anatomy. CONTRAST:  125mL OMNIPAQUE IOHEXOL 350 MG/ML SOLN COMPARISON:  None. FINDINGS: Cardiovascular: Satisfactory opacification of the pulmonary arteries to the segmental level. No evidence of pulmonary embolism. Normal heart size. No pericardial effusion. The main pulmonary artery is mildly enlarged at 3.2 cm. Trace atherosclerotic calcifications visualized along the left anterior descending coronary artery. Left IJ approach port catheter visualized. The tip of the catheter terminates at the cavoatrial junction. Mediastinum/Nodes: No enlarged mediastinal, hilar, or axillary lymph nodes. Thyroid gland, trachea, and esophagus demonstrate no significant findings. Lungs/Pleura: Lungs are clear. No pleural effusion or pneumothorax. Upper Abdomen: No acute abnormality. Musculoskeletal: No chest wall abnormality. No acute or significant osseous findings. Review of the MIP images confirms the above findings. IMPRESSION: 1. No evidence of pulmonary embolus, pneumonia or other acute cardiopulmonary process. 2. Mild enlargement of the main pulmonary artery at 3.2 cm. Findings can be seen in the setting of pulmonary arterial hypertension. 3. Trace atherosclerotic calcifications along the thoracic aorta and left anterior descending coronary artery. Aortic Atherosclerosis (ICD10-I70.0). Electronically Signed   By:  Jacqulynn Cadet M.D.   On: 07/02/2021 15:58   CT Abdomen Pelvis W Contrast  Result Date: 07/02/2021 CLINICAL DATA:  Upper abdominal and chest pain nausea EXAM: CT ABDOMEN AND PELVIS WITH CONTRAST TECHNIQUE: Multidetector CT imaging of the abdomen and pelvis was performed using the standard protocol following bolus administration of intravenous contrast. CONTRAST:  152mL OMNIPAQUE IOHEXOL 350 MG/ML SOLN COMPARISON:  CT 04/05/2021, 11/05/2019 FINDINGS: Lower chest: Lung bases demonstrate no acute consolidation or effusion. Mild cardiomegaly. Hepatobiliary: No calcified gallstone.  No biliary dilatation. Pancreas: Unremarkable. No pancreatic ductal dilatation or surrounding inflammatory changes. Spleen: Normal in size without focal abnormality. Adrenals/Urinary Tract: Adrenal glands are normal. Previously noted dominant cyst midpole left kidney is decreased with small residual. New left perinephric stranding and fluid. Additional hypodense renal lesions grossly unchanged. Mild left  hydronephrosis without obstructing stone. Probable punctate kidney stones. Mild stranding about the left ureter. The bladder is unremarkable. Stomach/Bowel: The stomach is nonenlarged. No dilated small bowel. No acute bowel wall thickening. Mild diverticular disease of the left colon. Vascular/Lymphatic: Moderate aortic atherosclerosis. No aneurysm. No suspicious nodes. Reproductive: Status post hysterectomy. No adnexal masses. Other: Negative for pelvic effusion or free air. Musculoskeletal: No acute or suspicious osseous abnormality. Right hip replacement with artifact. IMPRESSION: 1. Mild left hydronephrosis with new left perinephric fat stranding and small amount of perinephric fluid. Mild soft tissue stranding about the left ureter. Findings could be secondary to recently passed kidney stone versus ascending urinary tract infection. A dominant cyst at the midpole left kidney has significantly decreased in size and rupture of the  cyst could be contributing to some of the left perinephric fluid. 2. Negative for bowel obstruction or bowel wall thickening 3. Mild cardiomegaly. 4. Diverticular disease of the left colon without acute wall thickening. 5. Nonobstructing punctate kidney stones. Electronically Signed   By: Donavan Foil M.D.   On: 07/02/2021 16:17      Subjective: No issues overnight. Some abdominal pain which has improved.  Discharge Exam: Vitals:   07/07/21 0404 07/07/21 0808  BP: (!) 133/96 122/76  Pulse: (!) 56 60  Resp: 20 17  Temp: 98 F (36.7 C) 98.1 F (36.7 C)  SpO2: 98% 99%   Vitals:   07/06/21 1922 07/06/21 2307 07/07/21 0404 07/07/21 0808  BP: 109/68 (!) 130/92 (!) 133/96 122/76  Pulse: (!) 57 (!) 54 (!) 56 60  Resp: 18 20 20 17   Temp: 97.9 F (36.6 C) 97.8 F (36.6 C) 98 F (36.7 C) 98.1 F (36.7 C)  TempSrc:      SpO2: 99% 97% 98% 99%  Weight:      Height:        General: Pt is alert, awake, not in acute distress Cardiovascular: RRR, S1/S2 +, no rubs, no gallops Respiratory: CTA bilaterally, no wheezing, no rhonchi Abdominal: Soft, NT, ND, bowel sounds + Extremities: no edema, no cyanosis    The results of significant diagnostics from this hospitalization (including imaging, microbiology, ancillary and laboratory) are listed below for reference.     Microbiology: Recent Results (from the past 240 hour(s))  Resp Panel by RT-PCR (Flu A&B, Covid) Nasopharyngeal Swab     Status: None   Collection Time: 07/02/21  2:45 PM   Specimen: Nasopharyngeal Swab; Nasopharyngeal(NP) swabs in vial transport medium  Result Value Ref Range Status   SARS Coronavirus 2 by RT PCR NEGATIVE NEGATIVE Final    Comment: (NOTE) SARS-CoV-2 target nucleic acids are NOT DETECTED.  The SARS-CoV-2 RNA is generally detectable in upper respiratory specimens during the acute phase of infection. The lowest concentration of SARS-CoV-2 viral copies this assay can detect is 138 copies/mL. A negative  result does not preclude SARS-Cov-2 infection and should not be used as the sole basis for treatment or other patient management decisions. A negative result may occur with  improper specimen collection/handling, submission of specimen other than nasopharyngeal swab, presence of viral mutation(s) within the areas targeted by this assay, and inadequate number of viral copies(<138 copies/mL). A negative result must be combined with clinical observations, patient history, and epidemiological information. The expected result is Negative.  Fact Sheet for Patients:  EntrepreneurPulse.com.au  Fact Sheet for Healthcare Providers:  IncredibleEmployment.be  This test is no t yet approved or cleared by the [place] FDA and  has been authorized for detection  and/or diagnosis of SARS-CoV-2 by FDA under an Emergency Use Authorization (EUA). This EUA will remain  in effect (meaning this test can be used) for the duration of the COVID-19 declaration under Section 564(b)(1) of the Act, 21 U.S.C.section 360bbb-3(b)(1), unless the authorization is terminated  or revoked sooner.       Influenza A by PCR NEGATIVE NEGATIVE Final   Influenza B by PCR NEGATIVE NEGATIVE Final    Comment: (NOTE) The Xpert Xpress SARS-CoV-2/FLU/RSV plus assay is intended as an aid in the diagnosis of influenza from Nasopharyngeal swab specimens and should not be used as a sole basis for treatment. Nasal washings and aspirates are unacceptable for Xpert Xpress SARS-CoV-2/FLU/RSV testing.  Fact Sheet for Patients: EntrepreneurPulse.com.au  Fact Sheet for Healthcare Providers: IncredibleEmployment.be  This test is not yet approved or cleared by the [place] FDA and has been authorized for detection and/or diagnosis of SARS-CoV-2 by FDA under an Emergency Use Authorization (EUA). This EUA will remain in effect (meaning this test can be used)  for the duration of the COVID-19 declaration under Section 564(b)(1) of the Act, 21 U.S.C. section 360bbb-3(b)(1), unless the authorization is terminated or revoked.  Performed at [facility], [address] [place], [place] [address]      Labs: BNP (last 3 results) No results for input(s): BNP in the last 8760 hours. Basic Metabolic Panel: Recent Labs  Lab 07/02/21 1256 07/03/21 0711  NA 137 138  K 3.7 3.8  CL 108 105  CO2 24 26  GLUCOSE 104* 88  BUN 19 18  CREATININE 0.77 0.56  CALCIUM 8.7* 8.5*   Liver Function Tests: Recent Labs  Lab 07/02/21 1256  AST 20  ALT 15  ALKPHOS 59  BILITOT 0.8  PROT 6.8  ALBUMIN 3.8   Recent Labs  Lab 07/02/21 1256  LIPASE 24   No results for input(s): AMMONIA in the last 168 hours. CBC: Recent Labs  Lab 07/02/21 1646 07/03/21 0711 07/04/21 1033 07/05/21 0955 07/06/21 0509 07/07/21 0904  WBC 10.1 4.8 4.8  --  3.8* 3.4*  NEUTROABS 7.9*  --   --   --   --  2.1  HGB 11.4* 11.5* 11.2* 9.8* 9.3* 9.5*  HCT 33.3* 33.8* 32.4* 29.2* 27.7* 27.7*  MCV 92.0 91.4 90.5  --  91.7 91.1  PLT 148* 131* 139*  --  130* 156   Cardiac Enzymes: No results for input(s): CKTOTAL, CKMB, CKMBINDEX, TROPONINI in the last 168 hours. BNP: Invalid input(s): POCBNP CBG: No results for input(s): GLUCAP in the last 168 hours. D-Dimer No results for input(s): DDIMER in the last 72 hours. Hgb A1c No results for input(s): HGBA1C in the last 72 hours. Lipid Profile No results for input(s): CHOL, HDL, LDLCALC, TRIG, CHOLHDL, LDLDIRECT in the last 72 hours. Thyroid function studies No results for input(s): TSH, T4TOTAL, T3FREE, THYROIDAB in the last 72 hours.  Invalid input(s): FREET3 Anemia work up No results for input(s): VITAMINB12, FOLATE, FERRITIN, TIBC, IRON, RETICCTPCT in the last 72 hours. Urinalysis    Component Value Date/Time   COLORURINE YELLOW 07/02/2021 1516   APPEARANCEUR HAZY (A) 07/02/2021 1516   APPEARANCEUR  Clear 10/10/2014 2137   LABSPEC 1.015 07/02/2021 1516   LABSPEC 1.006 10/10/2014 2137   PHURINE 7.0 07/02/2021 1516   GLUCOSEU NEGATIVE 07/02/2021 1516   GLUCOSEU Negative 10/10/2014 2137   HGBUR LARGE (A) 07/02/2021 1516   BILIRUBINUR NEGATIVE 07/02/2021 1516   BILIRUBINUR neg 05/17/2016 1408   BILIRUBINUR Negative 10/10/2014 2137  KETONESUR NEGATIVE 07/02/2021 1516   PROTEINUR 30 (A) 07/02/2021 1516   UROBILINOGEN 0.2 05/17/2016 1408   NITRITE NEGATIVE 07/02/2021 1516   LEUKOCYTESUR TRACE (A) 07/02/2021 1516   LEUKOCYTESUR Negative 10/10/2014 2137   Sepsis Labs Invalid input(s): PROCALCITONIN,  WBC,  LACTICIDVEN Microbiology Recent Results (from the past 240 hour(s))  Resp Panel by RT-PCR (Flu A&B, Covid) Nasopharyngeal Swab     Status: None   Collection Time: 07/02/21  2:45 PM   Specimen: Nasopharyngeal Swab; Nasopharyngeal(NP) swabs in vial transport medium  Result Value Ref Range Status   SARS Coronavirus 2 by RT PCR NEGATIVE NEGATIVE Final    Comment: (NOTE) SARS-CoV-2 target nucleic acids are NOT DETECTED.  The SARS-CoV-2 RNA is generally detectable in upper respiratory specimens during the acute phase of infection. The lowest concentration of SARS-CoV-2 viral copies this assay can detect is 138 copies/mL. A negative result does not preclude SARS-Cov-2 infection and should not be used as the sole basis for treatment or other patient management decisions. A negative result may occur with  improper specimen collection/handling, submission of specimen other than nasopharyngeal swab, presence of viral mutation(s) within the areas targeted by this assay, and inadequate number of viral copies(<138 copies/mL). A negative result must be combined with clinical observations, patient history, and epidemiological information. The expected result is Negative.  Fact Sheet for Patients:  EntrepreneurPulse.com.au  Fact Sheet for Healthcare Providers:   IncredibleEmployment.be  This test is no t yet approved or cleared by the [place] FDA and  has been authorized for detection and/or diagnosis of SARS-CoV-2 by FDA under an Emergency Use Authorization (EUA). This EUA will remain  in effect (meaning this test can be used) for the duration of the COVID-19 declaration under Section 564(b)(1) of the Act, 21 U.S.C.section 360bbb-3(b)(1), unless the authorization is terminated  or revoked sooner.       Influenza A by PCR NEGATIVE NEGATIVE Final   Influenza B by PCR NEGATIVE NEGATIVE Final    Comment: (NOTE) The Xpert Xpress SARS-CoV-2/FLU/RSV plus assay is intended as an aid in the diagnosis of influenza from Nasopharyngeal swab specimens and should not be used as a sole basis for treatment. Nasal washings and aspirates are unacceptable for Xpert Xpress SARS-CoV-2/FLU/RSV testing.  Fact Sheet for Patients: EntrepreneurPulse.com.au  Fact Sheet for Healthcare Providers: IncredibleEmployment.be  This test is not yet approved or cleared by the [place] FDA and has been authorized for detection and/or diagnosis of SARS-CoV-2 by FDA under an Emergency Use Authorization (EUA). This EUA will remain in effect (meaning this test can be used) for the duration of the COVID-19 declaration under Section 564(b)(1) of the Act, 21 U.S.C. section 360bbb-3(b)(1), unless the authorization is terminated or revoked.  Performed at [facility], [address] [place], [place] [address]      Time coordinating discharge: 35 minutes  SIGNED:   Cordelia Poche, MD [facility] 07/07/2021, 11:57 AM

## 2021-07-07 NOTE — Progress Notes (Signed)
°  Transition of Care [facility]) Screening Note   Patient Details  Name: Amber Barnes Date of Birth: 1950-07-29   Transition of Care [facility]) CM/SW Contact:    Alberteen Sam, LCSW Phone Number: 07/07/2021, 2:47 PM    Transition of Care Department [facility]) has reviewed patient and no TOC needs have been identified at this time. We will continue to monitor patient advancement through interdisciplinary progression rounds. If new patient transition needs arise, please place a TOC consult.

## 2021-07-08 ENCOUNTER — Telehealth: Payer: Self-pay

## 2021-07-08 NOTE — Telephone Encounter (Signed)
Transition Care Management Follow-up Telephone Call Date of discharge and from where: 07/07/2021 [facility] How have you been since you were released from the hospital? Doing better, but a little weak Any questions or concerns? No  Items Reviewed: Did the pt receive and understand the discharge instructions provided? Yes  Medications obtained and verified? Yes  Other? No  Any new allergies since your discharge? No  Dietary orders reviewed? No Do you have support at home? Yes   Home Care and Equipment/Supplies: Were home health services ordered? not applicable If so, what is the name of the agency? N/a  Has the agency set up a time to come to the patient's home? not applicable Were any new equipment or medical supplies ordered?  No What is the name of the medical supply agency? N/a Were you able to get the supplies/equipment? not applicable Do you have any questions related to the use of the equipment or supplies? No  Functional Questionnaire: (I = Independent and D = Dependent) ADLs: I  Bathing/Dressing- I  Meal Prep- D  Eating- I  Maintaining continence- I  Transferring/Ambulation- I  Managing Meds- I  Follow up appointments reviewed:  PCP Hospital f/u appt confirmed? No available appointment to schedule within the week. Deferred to PCP Are transportation arrangements needed? No  If their condition worsens, is the pt aware to call PCP or go to the Emergency Dept.? Yes Was the patient provided with contact information for the PCP's office or ED? Yes Was to pt encouraged to call back with questions or concerns? Yes

## 2021-07-09 NOTE — Telephone Encounter (Signed)
Amber Barnes See what appts I have Discharged on 07/02/21 however no dc summary available to see when she was recommended to be seen We can add on Tues 07/12/21 at 12pm

## 2021-07-09 NOTE — Telephone Encounter (Signed)
I spoke with patient & she is willing to come in 07/13/21 at 12p. I have scheduled patient.

## 2021-07-11 ENCOUNTER — Other Ambulatory Visit: Payer: Self-pay | Admitting: Cardiovascular Disease

## 2021-07-12 ENCOUNTER — Other Ambulatory Visit: Payer: Self-pay

## 2021-07-12 ENCOUNTER — Inpatient Hospital Stay: Payer: Medicare Other | Admitting: Anesthesiology

## 2021-07-12 ENCOUNTER — Encounter
Admission: EM | Disposition: A | Discharge status: Home / Self Care | Payer: Self-pay | Source: Home / Self Care | Attending: Hospitalist

## 2021-07-12 ENCOUNTER — Encounter: Payer: Self-pay | Admitting: Emergency Medicine

## 2021-07-12 ENCOUNTER — Emergency Department: Payer: Medicare Other

## 2021-07-12 ENCOUNTER — Inpatient Hospital Stay
Admission: EM | Admit: 2021-07-12 | Discharge: 2021-07-17 | DRG: 660 | Disposition: A | Discharge status: Home / Self Care | Payer: Medicare Other | Attending: Hospitalist | Admitting: Hospitalist

## 2021-07-12 DIAGNOSIS — Z888 Allergy status to other drugs, medicaments and biological substances status: Secondary | ICD-10-CM

## 2021-07-12 DIAGNOSIS — N135 Crossing vessel and stricture of ureter without hydronephrosis: Secondary | ICD-10-CM | POA: Diagnosis present

## 2021-07-12 DIAGNOSIS — R197 Diarrhea, unspecified: Secondary | ICD-10-CM | POA: Diagnosis present

## 2021-07-12 DIAGNOSIS — N10 Acute tubulo-interstitial nephritis: Secondary | ICD-10-CM | POA: Diagnosis not present

## 2021-07-12 DIAGNOSIS — Z806 Family history of leukemia: Secondary | ICD-10-CM

## 2021-07-12 DIAGNOSIS — I130 Hypertensive heart and chronic kidney disease with heart failure and stage 1 through stage 4 chronic kidney disease, or unspecified chronic kidney disease: Secondary | ICD-10-CM | POA: Diagnosis not present

## 2021-07-12 DIAGNOSIS — J449 Chronic obstructive pulmonary disease, unspecified: Secondary | ICD-10-CM | POA: Diagnosis present

## 2021-07-12 DIAGNOSIS — Z79899 Other long term (current) drug therapy: Secondary | ICD-10-CM

## 2021-07-12 DIAGNOSIS — F32A Depression, unspecified: Secondary | ICD-10-CM | POA: Diagnosis present

## 2021-07-12 DIAGNOSIS — F419 Anxiety disorder, unspecified: Secondary | ICD-10-CM | POA: Diagnosis present

## 2021-07-12 DIAGNOSIS — N182 Chronic kidney disease, stage 2 (mild): Secondary | ICD-10-CM | POA: Diagnosis not present

## 2021-07-12 DIAGNOSIS — E860 Dehydration: Secondary | ICD-10-CM | POA: Diagnosis not present

## 2021-07-12 DIAGNOSIS — N23 Unspecified renal colic: Secondary | ICD-10-CM | POA: Diagnosis not present

## 2021-07-12 DIAGNOSIS — R319 Hematuria, unspecified: Secondary | ICD-10-CM | POA: Diagnosis not present

## 2021-07-12 DIAGNOSIS — Z96641 Presence of right artificial hip joint: Secondary | ICD-10-CM | POA: Diagnosis present

## 2021-07-12 DIAGNOSIS — Z20822 Contact with and (suspected) exposure to covid-19: Secondary | ICD-10-CM | POA: Diagnosis not present

## 2021-07-12 DIAGNOSIS — N200 Calculus of kidney: Secondary | ICD-10-CM

## 2021-07-12 DIAGNOSIS — K219 Gastro-esophageal reflux disease without esophagitis: Secondary | ICD-10-CM | POA: Diagnosis present

## 2021-07-12 DIAGNOSIS — Z95828 Presence of other vascular implants and grafts: Secondary | ICD-10-CM | POA: Diagnosis not present

## 2021-07-12 DIAGNOSIS — Z83438 Family history of other disorder of lipoprotein metabolism and other lipidemia: Secondary | ICD-10-CM | POA: Diagnosis not present

## 2021-07-12 DIAGNOSIS — G4733 Obstructive sleep apnea (adult) (pediatric): Secondary | ICD-10-CM | POA: Diagnosis present

## 2021-07-12 DIAGNOSIS — Z6841 Body Mass Index (BMI) 40.0 and over, adult: Secondary | ICD-10-CM

## 2021-07-12 DIAGNOSIS — Z87891 Personal history of nicotine dependence: Secondary | ICD-10-CM | POA: Diagnosis not present

## 2021-07-12 DIAGNOSIS — Z743 Need for continuous supervision: Secondary | ICD-10-CM | POA: Diagnosis not present

## 2021-07-12 DIAGNOSIS — Z818 Family history of other mental and behavioral disorders: Secondary | ICD-10-CM

## 2021-07-12 DIAGNOSIS — Z803 Family history of malignant neoplasm of breast: Secondary | ICD-10-CM | POA: Diagnosis not present

## 2021-07-12 DIAGNOSIS — D62 Acute posthemorrhagic anemia: Secondary | ICD-10-CM | POA: Diagnosis not present

## 2021-07-12 DIAGNOSIS — K8689 Other specified diseases of pancreas: Secondary | ICD-10-CM | POA: Diagnosis not present

## 2021-07-12 DIAGNOSIS — I5032 Chronic diastolic (congestive) heart failure: Secondary | ICD-10-CM | POA: Diagnosis present

## 2021-07-12 DIAGNOSIS — Z853 Personal history of malignant neoplasm of breast: Secondary | ICD-10-CM

## 2021-07-12 DIAGNOSIS — I10 Essential (primary) hypertension: Secondary | ICD-10-CM | POA: Diagnosis not present

## 2021-07-12 DIAGNOSIS — N201 Calculus of ureter: Secondary | ICD-10-CM | POA: Diagnosis not present

## 2021-07-12 DIAGNOSIS — N132 Hydronephrosis with renal and ureteral calculous obstruction: Secondary | ICD-10-CM | POA: Diagnosis not present

## 2021-07-12 DIAGNOSIS — I4891 Unspecified atrial fibrillation: Secondary | ICD-10-CM | POA: Diagnosis not present

## 2021-07-12 DIAGNOSIS — N3001 Acute cystitis with hematuria: Secondary | ICD-10-CM

## 2021-07-12 DIAGNOSIS — R109 Unspecified abdominal pain: Secondary | ICD-10-CM | POA: Diagnosis not present

## 2021-07-12 DIAGNOSIS — E890 Postprocedural hypothyroidism: Secondary | ICD-10-CM | POA: Diagnosis not present

## 2021-07-12 DIAGNOSIS — N136 Pyonephrosis: Secondary | ICD-10-CM | POA: Diagnosis not present

## 2021-07-12 DIAGNOSIS — Z9049 Acquired absence of other specified parts of digestive tract: Secondary | ICD-10-CM | POA: Diagnosis not present

## 2021-07-12 DIAGNOSIS — R6889 Other general symptoms and signs: Secondary | ICD-10-CM | POA: Diagnosis not present

## 2021-07-12 DIAGNOSIS — Z91040 Latex allergy status: Secondary | ICD-10-CM

## 2021-07-12 DIAGNOSIS — R9431 Abnormal electrocardiogram [ECG] [EKG]: Secondary | ICD-10-CM | POA: Diagnosis not present

## 2021-07-12 DIAGNOSIS — R001 Bradycardia, unspecified: Secondary | ICD-10-CM | POA: Diagnosis not present

## 2021-07-12 DIAGNOSIS — E785 Hyperlipidemia, unspecified: Secondary | ICD-10-CM | POA: Diagnosis present

## 2021-07-12 DIAGNOSIS — Z91048 Other nonmedicinal substance allergy status: Secondary | ICD-10-CM

## 2021-07-12 DIAGNOSIS — Z885 Allergy status to narcotic agent status: Secondary | ICD-10-CM

## 2021-07-12 DIAGNOSIS — Z8249 Family history of ischemic heart disease and other diseases of the circulatory system: Secondary | ICD-10-CM

## 2021-07-12 DIAGNOSIS — N1330 Unspecified hydronephrosis: Secondary | ICD-10-CM | POA: Diagnosis present

## 2021-07-12 DIAGNOSIS — I4819 Other persistent atrial fibrillation: Secondary | ICD-10-CM

## 2021-07-12 DIAGNOSIS — Z9221 Personal history of antineoplastic chemotherapy: Secondary | ICD-10-CM

## 2021-07-12 DIAGNOSIS — N12 Tubulo-interstitial nephritis, not specified as acute or chronic: Secondary | ICD-10-CM | POA: Diagnosis not present

## 2021-07-12 DIAGNOSIS — Z7901 Long term (current) use of anticoagulants: Secondary | ICD-10-CM

## 2021-07-12 HISTORY — PX: CYSTOSCOPY WITH STENT PLACEMENT: SHX5790

## 2021-07-12 LAB — URINALYSIS, ROUTINE W REFLEX MICROSCOPIC
Bilirubin Urine: NEGATIVE
Glucose, UA: 50 mg/dL — AB
Ketones, ur: NEGATIVE mg/dL
Nitrite: NEGATIVE
Protein, ur: 100 mg/dL — AB
RBC / HPF: >50 RBC/hpf — ABNORMAL HIGH (ref 0–5)
Specific Gravity, Urine: 1.035 — ABNORMAL HIGH (ref 1.005–1.030)
WBC, UA: >50 WBC/hpf — ABNORMAL HIGH (ref 0–5)
pH: 7 (ref 5.0–8.0)

## 2021-07-12 LAB — RESP PANEL BY RT-PCR (FLU A&B, COVID) ARPGX2
Influenza A by PCR: NEGATIVE
Influenza B by PCR: NEGATIVE
SARS Coronavirus 2 by RT PCR: NEGATIVE

## 2021-07-12 LAB — COMPREHENSIVE METABOLIC PANEL
ALT: 20 U/L (ref 0–44)
AST: 29 U/L (ref 15–41)
Albumin: 3.7 g/dL (ref 3.5–5.0)
Alkaline Phosphatase: 91 U/L (ref 38–126)
Anion gap: 9 (ref 5–15)
BUN: 12 mg/dL (ref 8–23)
CO2: 23 mmol/L (ref 22–32)
Calcium: 8.8 mg/dL — ABNORMAL LOW (ref 8.9–10.3)
Chloride: 109 mmol/L (ref 98–111)
Creatinine, Ser: 1.02 mg/dL — ABNORMAL HIGH (ref 0.44–1.00)
GFR, Estimated: 60 mL/min — ABNORMAL LOW (ref >60)
Glucose, Bld: 135 mg/dL — ABNORMAL HIGH (ref 70–99)
Potassium: 4.2 mmol/L (ref 3.5–5.1)
Sodium: 141 mmol/L (ref 135–145)
Total Bilirubin: 0.9 mg/dL (ref 0.3–1.2)
Total Protein: 7.1 g/dL (ref 6.5–8.1)

## 2021-07-12 LAB — CBC
HCT: 35.2 % — ABNORMAL LOW (ref 36.0–46.0)
Hemoglobin: 11.5 g/dL — ABNORMAL LOW (ref 12.0–15.0)
MCH: 30.9 pg (ref 26.0–34.0)
MCHC: 32.7 g/dL (ref 30.0–36.0)
MCV: 94.6 fL (ref 80.0–100.0)
Platelets: 257 10*3/uL (ref 150–400)
RBC: 3.72 MIL/uL — ABNORMAL LOW (ref 3.87–5.11)
RDW: 14.8 % (ref 11.5–15.5)
WBC: 7.1 10*3/uL (ref 4.0–10.5)
nRBC: 0 % (ref 0.0–0.2)

## 2021-07-12 LAB — BRAIN NATRIURETIC PEPTIDE: B Natriuretic Peptide: 58.1 pg/mL (ref 0.0–100.0)

## 2021-07-12 LAB — LIPASE, BLOOD: Lipase: 22 U/L (ref 11–51)

## 2021-07-12 LAB — TROPONIN I (HIGH SENSITIVITY)
Troponin I (High Sensitivity): 4 ng/L (ref <18)
Troponin I (High Sensitivity): 3 ng/L (ref <18)

## 2021-07-12 SURGERY — CYSTOSCOPY, WITH STENT INSERTION
Anesthesia: General | Laterality: Bilateral

## 2021-07-12 MED ORDER — ROPINIROLE HCL 1 MG PO TABS
2.0000 mg | ORAL_TABLET | Freq: Every day | ORAL | Status: DC
  Administered 2021-07-12 – 2021-07-16 (×5): 2 mg via ORAL
  Filled 2021-07-12 (×6): qty 2

## 2021-07-12 MED ORDER — ACETAMINOPHEN 10 MG/ML IV SOLN: 1000.0000 mg | Freq: Once | INTRAVENOUS | Status: DC | PRN

## 2021-07-12 MED ORDER — ALPRAZOLAM 0.25 MG PO TABS
0.2500 mg | ORAL_TABLET | Freq: Every evening | ORAL | Status: DC | PRN
  Filled 2021-07-12 (×2): qty 1

## 2021-07-12 MED ORDER — SODIUM CHLORIDE 0.9 % IV SOLN
INTRAVENOUS | Status: DC | PRN
  Administered 2021-07-12: 400 mL

## 2021-07-12 MED ORDER — MIDAZOLAM HCL 2 MG/2ML IJ SOLN
INTRAMUSCULAR | Status: AC
  Filled 2021-07-12: qty 2

## 2021-07-12 MED ORDER — FENTANYL CITRATE (PF) 100 MCG/2ML IJ SOLN: 25.0000 ug | INTRAMUSCULAR | Status: DC | PRN

## 2021-07-12 MED ORDER — EPHEDRINE SULFATE 50 MG/ML IJ SOLN
INTRAMUSCULAR | Status: DC | PRN
  Administered 2021-07-12: 20 mg via INTRAVENOUS
  Administered 2021-07-12: 5 mg via INTRAVENOUS

## 2021-07-12 MED ORDER — SODIUM CHLORIDE 0.9 % IV SOLN: INTRAVENOUS | Status: DC

## 2021-07-12 MED ORDER — ONDANSETRON HCL 4 MG/2ML IJ SOLN
INTRAMUSCULAR | Status: DC | PRN
  Administered 2021-07-12: 4 mg via INTRAVENOUS

## 2021-07-12 MED ORDER — SODIUM CHLORIDE 0.9 % IV SOLN
1.0000 g | INTRAVENOUS | Status: DC
  Administered 2021-07-13 – 2021-07-17 (×5): 1 g via INTRAVENOUS
  Filled 2021-07-12: qty 1
  Filled 2021-07-12: qty 10
  Filled 2021-07-12 (×3): qty 1

## 2021-07-12 MED ORDER — GLYCOPYRROLATE 0.2 MG/ML IJ SOLN
INTRAMUSCULAR | Status: AC
  Filled 2021-07-12: qty 1

## 2021-07-12 MED ORDER — FENTANYL CITRATE PF 50 MCG/ML IJ SOSY
25.0000 ug | PREFILLED_SYRINGE | INTRAMUSCULAR | Status: DC | PRN
Start: 2021-07-12 — End: 2021-07-13
  Administered 2021-07-12 – 2021-07-13 (×2): 25 ug via INTRAVENOUS
  Filled 2021-07-12 (×3): qty 1

## 2021-07-12 MED ORDER — ONDANSETRON HCL 4 MG/2ML IJ SOLN: 4.0000 mg | Freq: Once | INTRAMUSCULAR | Status: DC | PRN

## 2021-07-12 MED ORDER — DIPHENHYDRAMINE HCL 50 MG/ML IJ SOLN: 12.5000 mg | Freq: Three times a day (TID) | INTRAMUSCULAR | Status: DC | PRN

## 2021-07-12 MED ORDER — DM-GUAIFENESIN ER 30-600 MG PO TB12: 1.0000 | ORAL_TABLET | Freq: Two times a day (BID) | ORAL | Status: DC | PRN

## 2021-07-12 MED ORDER — LIDOCAINE HCL (CARDIAC) PF 100 MG/5ML IV SOSY
PREFILLED_SYRINGE | INTRAVENOUS | Status: DC | PRN
  Administered 2021-07-12: 50 mg via INTRAVENOUS

## 2021-07-12 MED ORDER — SODIUM CHLORIDE 0.9 % IV SOLN
2.0000 g | Freq: Once | INTRAVENOUS | Status: AC
  Administered 2021-07-12: 2 g via INTRAVENOUS
  Filled 2021-07-12: qty 20

## 2021-07-12 MED ORDER — FENTANYL CITRATE PF 50 MCG/ML IJ SOSY
50.0000 ug | PREFILLED_SYRINGE | Freq: Once | INTRAMUSCULAR | Status: AC
  Administered 2021-07-12: 50 ug via INTRAVENOUS
  Filled 2021-07-12: qty 1

## 2021-07-12 MED ORDER — ONDANSETRON HCL 4 MG/2ML IJ SOLN
4.0000 mg | Freq: Once | INTRAMUSCULAR | Status: AC
  Administered 2021-07-12: 4 mg via INTRAVENOUS
  Filled 2021-07-12: qty 2

## 2021-07-12 MED ORDER — DIPHENHYDRAMINE HCL 25 MG PO CAPS
25.0000 mg | ORAL_CAPSULE | Freq: Four times a day (QID) | ORAL | Status: DC | PRN
  Administered 2021-07-12 – 2021-07-17 (×13): 25 mg via ORAL
  Filled 2021-07-12 (×13): qty 1

## 2021-07-12 MED ORDER — LIDOCAINE HCL (PF) 2 % IJ SOLN
INTRAMUSCULAR | Status: AC
  Filled 2021-07-12: qty 5

## 2021-07-12 MED ORDER — VITAMIN D 25 MCG (1000 UNIT) PO TABS
5000.0000 [IU] | ORAL_TABLET | Freq: Every day | ORAL | Status: DC
  Administered 2021-07-13 – 2021-07-17 (×5): 5000 [IU] via ORAL
  Filled 2021-07-12 (×5): qty 5

## 2021-07-12 MED ORDER — EPHEDRINE 5 MG/ML INJ
INTRAVENOUS | Status: AC
  Filled 2021-07-12: qty 5

## 2021-07-12 MED ORDER — IOHEXOL 300 MG/ML  SOLN
100.0000 mL | Freq: Once | INTRAMUSCULAR | Status: AC | PRN
  Administered 2021-07-12: 100 mL via INTRAVENOUS

## 2021-07-12 MED ORDER — SUCCINYLCHOLINE CHLORIDE 200 MG/10ML IV SOSY
PREFILLED_SYRINGE | INTRAVENOUS | Status: DC | PRN
  Administered 2021-07-12: 140 mg via INTRAVENOUS

## 2021-07-12 MED ORDER — OXYCODONE HCL 5 MG PO TABS
5.0000 mg | ORAL_TABLET | Freq: Four times a day (QID) | ORAL | Status: DC | PRN
  Administered 2021-07-12 – 2021-07-13 (×2): 5 mg via ORAL
  Filled 2021-07-12 (×2): qty 1

## 2021-07-12 MED ORDER — RANOLAZINE ER 500 MG PO TB12
500.0000 mg | ORAL_TABLET | Freq: Two times a day (BID) | ORAL | Status: DC
  Administered 2021-07-12 – 2021-07-17 (×10): 500 mg via ORAL
  Filled 2021-07-12 (×11): qty 1

## 2021-07-12 MED ORDER — FUROSEMIDE 20 MG PO TABS
20.0000 mg | ORAL_TABLET | Freq: Every day | ORAL | Status: DC
  Administered 2021-07-12 – 2021-07-17 (×6): 20 mg via ORAL
  Filled 2021-07-12 (×6): qty 1

## 2021-07-12 MED ORDER — PHENYLEPHRINE HCL (PRESSORS) 10 MG/ML IV SOLN
INTRAVENOUS | Status: DC | PRN
  Administered 2021-07-12 (×2): 160 ug via INTRAVENOUS

## 2021-07-12 MED ORDER — ONDANSETRON HCL 4 MG/2ML IJ SOLN
INTRAMUSCULAR | Status: AC
  Filled 2021-07-12: qty 2

## 2021-07-12 MED ORDER — ACETAMINOPHEN 325 MG PO TABS
650.0000 mg | ORAL_TABLET | Freq: Four times a day (QID) | ORAL | Status: DC | PRN
  Administered 2021-07-14 (×2): 650 mg via ORAL
  Filled 2021-07-12 (×3): qty 2

## 2021-07-12 MED ORDER — ALBUTEROL SULFATE (2.5 MG/3ML) 0.083% IN NEBU: 2.5000 mg | INHALATION_SOLUTION | RESPIRATORY_TRACT | Status: DC | PRN

## 2021-07-12 MED ORDER — DILTIAZEM HCL ER COATED BEADS 120 MG PO CP24
240.0000 mg | ORAL_CAPSULE | Freq: Every day | ORAL | Status: DC
  Administered 2021-07-13 – 2021-07-14 (×2): 240 mg via ORAL
  Filled 2021-07-12 (×2): qty 2

## 2021-07-12 MED ORDER — GLYCOPYRROLATE 0.2 MG/ML IJ SOLN
INTRAMUSCULAR | Status: DC | PRN
  Administered 2021-07-12: .2 mg via INTRAVENOUS

## 2021-07-12 MED ORDER — BUSPIRONE HCL 5 MG PO TABS
5.0000 mg | ORAL_TABLET | Freq: Three times a day (TID) | ORAL | Status: DC | PRN
  Filled 2021-07-12: qty 1

## 2021-07-12 MED ORDER — FENTANYL CITRATE (PF) 100 MCG/2ML IJ SOLN
INTRAMUSCULAR | Status: AC
  Filled 2021-07-12: qty 2

## 2021-07-12 MED ORDER — APIXABAN 5 MG PO TABS
5.0000 mg | ORAL_TABLET | Freq: Two times a day (BID) | ORAL | Status: DC
  Administered 2021-07-13 – 2021-07-14 (×4): 5 mg via ORAL
  Filled 2021-07-12 (×4): qty 1

## 2021-07-12 MED ORDER — PANTOPRAZOLE SODIUM 40 MG PO TBEC: 40.0000 mg | DELAYED_RELEASE_TABLET | Freq: Every day | ORAL | Status: DC | PRN

## 2021-07-12 MED ORDER — PROPOFOL 10 MG/ML IV BOLUS
INTRAVENOUS | Status: DC | PRN
  Administered 2021-07-12: 140 mg via INTRAVENOUS
  Administered 2021-07-12: 20 mg via INTRAVENOUS

## 2021-07-12 MED ORDER — SUCCINYLCHOLINE CHLORIDE 200 MG/10ML IV SOSY
PREFILLED_SYRINGE | INTRAVENOUS | Status: AC
  Filled 2021-07-12: qty 10

## 2021-07-12 MED ORDER — DULOXETINE HCL 30 MG PO CPEP
30.0000 mg | ORAL_CAPSULE | Freq: Two times a day (BID) | ORAL | Status: DC
  Administered 2021-07-12 – 2021-07-17 (×10): 30 mg via ORAL
  Filled 2021-07-12 (×10): qty 1

## 2021-07-12 MED ORDER — FENTANYL CITRATE (PF) 100 MCG/2ML IJ SOLN
INTRAMUSCULAR | Status: DC | PRN
  Administered 2021-07-12 (×2): 50 ug via INTRAVENOUS

## 2021-07-12 MED ORDER — DEXAMETHASONE SODIUM PHOSPHATE 10 MG/ML IJ SOLN
INTRAMUSCULAR | Status: DC | PRN
  Administered 2021-07-12: 10 mg via INTRAVENOUS

## 2021-07-12 MED ORDER — HYDRALAZINE HCL 20 MG/ML IJ SOLN: 5.0000 mg | INTRAMUSCULAR | Status: DC | PRN

## 2021-07-12 MED ORDER — BISOPROLOL FUMARATE 5 MG PO TABS
5.0000 mg | ORAL_TABLET | Freq: Two times a day (BID) | ORAL | Status: DC
  Administered 2021-07-12 – 2021-07-15 (×7): 5 mg via ORAL
  Filled 2021-07-12 (×8): qty 1

## 2021-07-12 MED ORDER — ROSUVASTATIN CALCIUM 10 MG PO TABS
20.0000 mg | ORAL_TABLET | Freq: Every day | ORAL | Status: DC
Start: 2021-07-12 — End: 2021-07-17
  Administered 2021-07-13 – 2021-07-17 (×5): 20 mg via ORAL
  Filled 2021-07-12 (×5): qty 2

## 2021-07-12 MED ORDER — SODIUM CHLORIDE 0.9 % IV BOLUS
1000.0000 mL | Freq: Once | INTRAVENOUS | Status: AC
  Administered 2021-07-12: 1000 mL via INTRAVENOUS

## 2021-07-12 MED ORDER — ADULT MULTIVITAMIN W/MINERALS CH
1.0000 | ORAL_TABLET | Freq: Every day | ORAL | Status: DC
  Administered 2021-07-13 – 2021-07-17 (×5): 1 via ORAL
  Filled 2021-07-12 (×5): qty 1

## 2021-07-12 MED ORDER — DEXAMETHASONE SODIUM PHOSPHATE 10 MG/ML IJ SOLN
INTRAMUSCULAR | Status: AC
  Filled 2021-07-12: qty 1

## 2021-07-12 SURGICAL SUPPLY — 9 items
BAG DRAIN CYSTO-URO LG1000N (MISCELLANEOUS) ×2 IMPLANT
GUIDEWIRE STR DUAL SENSOR (WIRE) ×2 IMPLANT
IV NS IRRIG 3000ML ARTHROMATIC (IV SOLUTION) ×2 IMPLANT
KIT TURNOVER CYSTO (KITS) ×2 IMPLANT
PACK CYSTO (CUSTOM PROCEDURE TRAY) ×2 IMPLANT
SET CYSTO W/LG BORE CLAMP LF (SET/KITS/TRAYS/PACK) ×2 IMPLANT
SOL PREP POV-IOD 4OZ 10% (MISCELLANEOUS) ×2 IMPLANT
STENT URET 6FRX24 CONTOUR (STENTS) ×4 IMPLANT
SURGILUBE 2OZ TUBE FLIPTOP (MISCELLANEOUS) ×2 IMPLANT

## 2021-07-12 NOTE — Progress Notes (Signed)
This RN called son Rozell Searing with update. Pt continues to wait for room assignment, denies any needs. Will continue to monitor.

## 2021-07-12 NOTE — ED Notes (Signed)
I have attempted to draw blood culture from left hand and unsuccessful.  I called the lab, and they will come to get the blood cultures.  She says they have not been sticking in right side due to recent breatst surgery, so I placed a pink band ont hat side.

## 2021-07-12 NOTE — Anesthesia Procedure Notes (Signed)
Procedure Name: Intubation Date/Time: 07/12/2021 1:27 PM Performed by: Rolla Plate, CRNA Pre-anesthesia Checklist: Patient identified, Patient being monitored, Timeout performed, Emergency Drugs available and Suction available Patient Re-evaluated:Patient Re-evaluated prior to induction Oxygen Delivery Method: Circle system utilized Preoxygenation: Pre-oxygenation with 100% oxygen Induction Type: IV induction and Rapid sequence Laryngoscope Size: 3 and McGraph Grade View: Grade I Tube type: Oral Tube size: 6.5 mm Number of attempts: 1 Airway Equipment and Method: Stylet and Video-laryngoscopy Placement Confirmation: ETT inserted through vocal cords under direct vision, positive ETCO2 and breath sounds checked- equal and bilateral Secured at: 21 cm Tube secured with: Tape Dental Injury: Teeth and Oropharynx as per pre-operative assessment

## 2021-07-12 NOTE — H&P (Signed)
History and Physical    Amber Barnes IFO:[id number] DOB: 08-30-1950 DOA: 07/12/2021  Referring MD/NP/PA:   PCP: [name], FNP   Patient coming from:  The patient is coming from home.  At baseline, pt is independent for most of ADL.        Chief Complaint: Abdominal pain  HPI: Amber Barnes is a 71 y.o. female with medical history significant of hypertension, hyperlipidemia, COPD, GERD, depression with anxiety, OSA not on CPAP, dCHF, RLS, PUD, mitral valve regurgitation, breast cancer (s/p of mastectomy, on chemo therapy), atrial fibrillation on Eliquis, CKD-II, who presents with abdominal pain.  Patient was recently hospitalized from 12/23-12/28 due to acute pyelonephritis.  She just finished a course of ciprofloxacin treatment.  She stated that she developed abdominal pain, which is located in the right upper quadrant, constant, aching, radiating to the back.  She has burning on urination, denies dysuria or urgency.  No hematuria. Patient also reports nausea, vomiting and diarrhea.  Patient has had several episodes of nonbilious nonbloody vomiting, several episodes of diarrhea today.  Patient has dry cough, no shortness breath.  Patient stated she had some chest discomfort earlier, which is resolved.  Currently no chest pain.  Patient has Port-A line in left upper chest.  No fever or chills.  ED Course: pt was found to have positive urinalysis (cloudy appearance, trace amount of leukocyte, rare bacteria, WBC > 50, RBC > 50), negative COVID PCR, WBC 7.1, troponin level 4, 3, stable renal function, temperature normal, blood pressure 133/70, heart rate 111, RR 20, oxygen saturation 94% on room air.  CT abdomen/pelvis that showed right hydroureteronephrosis with 3 mm of obstructive UVJ stone.  Dr. Karie Fetch of urology due to urgent ureteral stent placement for patient.  Review of Systems:   General: no fevers, chills, no body weight gain, has poor appetite, has fatigue HEENT: no  blurry vision, hearing changes or sore throat Respiratory: no dyspnea, has coughing, no wheezing CV: no chest pain, no palpitations GI: has nausea, vomiting, abdominal pain, diarrhea, no constipation GU: no dysuria, has burning on urination, no increased urinary frequency, hematuria  Ext: has trace leg edema Neuro: no unilateral weakness, numbness, or tingling, no vision change or hearing loss Skin: no rash, no skin tear. MSK: No muscle spasm, no deformity, no limitation of range of movement in spin Heme: No easy bruising.  Travel history: No recent long distant travel.  Allergy:  Allergies  Allergen Reactions   Prednisone Other (See Comments)    Agitation, hyperactivity, polyphagia   Amiodarone Nausea And Vomiting   Hydrocodone-Acetaminophen Other (See Comments)    Hallucinations and combative   Oxycodone Itching    Can take it with benadryl   Tapentadol Itching   Tramadol Itching    Can take it with benadryl   Adhesive [Tape] Itching and Rash    Prefers paper tape   Latex Itching and Rash    use paper tap    Past Medical History:  Diagnosis Date   Achilles tendinitis    Acute medial meniscal tear    Allergy    Anxiety    Atrial fibrillation ([place])    a. new onset 07/2016; b. CHADS2VASc --> 4 (HTN, female, age, CHF); c. eliquis   CHF (congestive heart failure) (HCC)    Chronic anticoagulation    Apixaban   Chronic lower back pain    Colon polyp    Complication of anesthesia    Emergence delirium; hypoxia   COPD (chronic obstructive  pulmonary disease) ([place])    Cystic breast    Depression    Endometriosis    Fibrocystic breast disease    GERD (gastroesophageal reflux disease)    History of stress test    a. 07/2016 MV: EF 55-65%. Small, mild defect  in mid anteroseptal, apical septal, and apical locations. No ischemia-->low risk.   Hyperlipidemia    Hypertension    Insomnia    Lipoma    Malignant neoplasm of upper-outer quadrant of right female breast (HCC)     Stage 1A invasive mammary carcinoma (cT1b or T1c N0); ER (-), PR (+), HER2/neu (+)   Migraine    Mild Mitral regurgitation    a. 07/2016 Echo: EF 55-65%, no rwma, mild MR. Mildly dil LA. Nl RV fx.    OSA (obstructive sleep apnea)    non-compliant with nocturnal PAP therapy   Osteoarthritis    left knee   Osteopenia    Peptic ulcer disease    a. H. pylori   Pneumonia    RLS (restless legs syndrome)    Tendonitis    Thyroid disease    Vitamin D deficiency     Past Surgical History:  Procedure Laterality Date   ABDOMINAL HYSTERECTOMY     ovaries left   ACHILLES TENDON SURGERY     2012,01/2017   BACK SURGERY     fusion lumbar   BREAST BIOPSY Right 01/15/2021   Affirm biopsy-"X" clip-INVASIVE MAMMARY CARCINOMA   BREAST BIOPSY Right 01/15/2021   u/s bx-"venus" clip INVASIVE MAMMARY CARCINOMA   BREAST BIOPSY     BREAST LUMPECTOMY,RADIO FREQ LOCALIZER,AXILLARY SENTINEL LYMPH NODE BIOPSY Right 02/17/2021   Procedure: BREAST LUMPECTOMY,RADIO FREQ LOCALIZER,AXILLARY SENTINEL LYMPH NODE BIOPSY;  Surgeon: Ronny Bacon, MD;  Location: [facility];  Service: General;  Laterality: Right;   CARDIOVERSION N/A 09/07/2016   Procedure: CARDIOVERSION;  Surgeon: Minna Merritts, MD;  Location: [facility];  Service: Cardiovascular;  Laterality: N/A;   CARDIOVERSION N/A 10/19/2016   Procedure: CARDIOVERSION;  Surgeon: Minna Merritts, MD;  Location: [facility];  Service: Cardiovascular;  Laterality: N/A;   CARDIOVERSION N/A 12/14/2016   Procedure: CARDIOVERSION;  Surgeon: Minna Merritts, MD;  Location: [facility];  Service: Cardiovascular;  Laterality: N/A;   COLONOSCOPY     COLONOSCOPY WITH PROPOFOL N/A 05/13/2020   Procedure: COLONOSCOPY WITH PROPOFOL;  Surgeon: Toledo, Benay Pike, MD;  Location: [facility];  Service: Gastroenterology;  Laterality: N/A;  ELIQUIS   ESOPHAGOGASTRODUODENOSCOPY     ESOPHAGOGASTRODUODENOSCOPY (EGD) WITH PROPOFOL N/A 05/13/2020   Procedure: ESOPHAGOGASTRODUODENOSCOPY  (EGD) WITH PROPOFOL;  Surgeon: Toledo, Benay Pike, MD;  Location: [facility];  Service: Gastroenterology;  Laterality: N/A;   FOOT SURGERY Left    tendon   JOINT REPLACEMENT Right    hip   PORTA CATH INSERTION N/A 03/23/2021   Procedure: PORTA CATH INSERTION;  Surgeon: Katha Cabal, MD;  Location: [facility];  Service: Cardiovascular;  Laterality: N/A;   PORTACATH PLACEMENT N/A 03/23/2021   Procedure: ATTEMPTED INSERTION PORT-A-CATH;  Surgeon: Ronny Bacon, MD;  Location: [facility];  Service: General;  Laterality: N/A;   THYROIDECTOMY     thyroid lobectomy secondary to a benign nodule   TOTAL HIP ARTHROPLASTY Right 08/21/2018   Procedure: TOTAL HIP ARTHROPLASTY ANTERIOR APPROACH-RIGHT CELL SAVER REQUESTED;  Surgeon: [name], MD;  Location: [facility];  Service: Orthopedics;  Laterality: Right;    Social History:  reports that she quit smoking about 18 years ago. Her smoking use included cigarettes. She  has a 2.50 pack-year smoking history. She has never used smokeless tobacco. She reports that she does not currently use alcohol after a past usage of about 1.0 standard drink per week. She reports that she does not use drugs.  Family History:  Family History  Problem Relation Age of Onset   Cancer Sister 64       ovarian   Depression Sister    Cancer Brother        CLL   Cancer Father 70       colon   Heart disease Father    Heart attack Father 80       died from MI   Hyperlipidemia Father    Hypertension Father    Cancer Mother        leukemia   Breast cancer Paternal Aunt        28's   Migraines Neg Hx      Prior to Admission medications   Medication Sig Start Date End Date Taking? Authorizing Provider  acetaminophen (TYLENOL) 500 MG tablet Take 500-1,000 mg by mouth every 6 (six) hours as needed for mild pain or moderate pain.    [provider]  acetaminophen-codeine (TYLENOL #3) 300-30 MG tablet Take 1 tablet by mouth every 6 (six) hours  as needed for moderate pain or severe pain. 06/16/21   [name], MD  ALPRAZolam Duanne Moron) 0.25 MG tablet Take 0.25 mg by mouth at bedtime as needed for anxiety or sleep.    [provider]  apixaban (ELIQUIS) 5 MG TABS tablet Take 1 tablet (5 mg total) by mouth 2 (two) times daily. Start on 07/09/2021 07/07/21   Mariel Aloe, MD  azelastine (ASTELIN) 0.1 % nasal spray Place 1-2 sprays into both nostrils daily as needed for rhinitis.    [provider]  B Complex Vitamins (VITAMIN B COMPLEX PO) Take 1 tablet by mouth daily.     [provider]  bisoprolol (ZEBETA) 5 MG tablet Take 1 tablet (5 mg total) by mouth 2 (two) times daily. 04/09/21 07/02/21  Enzo Bi, MD  busPIRone (BUSPAR) 5 MG tablet TAKE 1 TABLET BY MOUTH THREE TIMES A DAY AS NEEDED 04/12/21   [name], FNP  Cholecalciferol (VITAMIN D3) 5000 units CAPS Take 5,000 Units by mouth daily.    [provider]  ciprofloxacin (CIPRO) 500 MG tablet Take 1 tablet (500 mg total) by mouth 2 (two) times daily for 4 days. 07/08/21 07/12/21  Mariel Aloe, MD  diltiazem (CARDIZEM CD) 240 MG 24 hr capsule Take 1 capsule (240 mg total) by mouth daily. 07/07/21 10/05/21  Mariel Aloe, MD  DULoxetine (CYMBALTA) 30 MG capsule Take 1 capsule (30 mg total) by mouth 2 (two) times daily. Home med. 04/09/21   Enzo Bi, MD  esomeprazole (NEXIUM) 20 MG capsule Take 20 mg by mouth daily as needed (GERD symptoms).    [provider]  furosemide (LASIX) 20 MG tablet Take 1 tablet (20 mg total) by mouth daily. Patient taking differently: Take 20 mg by mouth daily. (May take additional tablet daily if needed for swelling) 06/21/21   [name], FNP  lidocaine-prilocaine (EMLA) cream Apply to affected area once 03/19/21   [name], MD  loperamide (IMODIUM) 2 MG capsule Take 1 capsule (2 mg total) by mouth See admin instructions. Initial: 4 mg, followed by 2 mg after each loose stool; maximum: 16 mg/day 05/25/21    [name], MD  magic mouthwash SOLN Take  5 mLs by mouth 2 (two) times daily. 06/23/21   [name], MD  Multiple Vitamin (MULTIVITAMIN) tablet Take 1 tablet by mouth daily.    [provider]  nystatin (MYCOSTATIN/NYSTOP) powder Apply 1 application topically 3 (three) times daily. 05/11/21   [name], MD  ondansetron (ZOFRAN) 8 MG tablet Take 1 tablet (8 mg total) by mouth every 8 (eight) hours as needed for nausea or vomiting. 03/31/21   [name], MD  potassium chloride SA (KLOR-CON) 20 MEQ tablet Take 1 tablet (20 mEq total) by mouth 2 (two) times daily. Patient taking differently: Take 20-40 mEq by mouth daily. 03/03/21   [name], FNP  prochlorperazine (COMPAZINE) 10 MG tablet Take 1 tablet (10 mg total) by mouth every 6 (six) hours as needed (Nausea or vomiting). 03/19/21   [name], MD  promethazine (PHENERGAN) 25 MG tablet Take 1 tablet (25 mg total) by mouth every 6 (six) hours as needed for nausea or vomiting. 04/02/21   [name], MD  ranolazine (RANEXA) 500 MG 12 hr tablet TAKE 1 TABLET BY MOUTH TWICE A DAY 03/25/21   Gollan, [name], MD  rizatriptan (MAXALT) 10 MG tablet TAKE 1 TABLET (10 MG TOTAL) BY MOUTH AS NEEDED FOR MIGRAINE. MAY REPEAT IN 2 HOURS IF NEEDED 03/13/18   Melvenia Beam, MD  rOPINIRole (REQUIP) 2 MG tablet Take 1 tablet (2 mg total) by mouth at bedtime. 06/21/21   [name], FNP  rosuvastatin (CRESTOR) 20 MG tablet TAKE 1 TABLET BY MOUTH EVERY DAY 03/25/21   [name], FNP    Physical Exam: Vitals:   07/12/21 1500 07/12/21 1600 07/12/21 1636 07/12/21 1657  BP: 121/67 113/65 127/86 137/74  Pulse: 75 73 77 69  Resp: _0 Temp:   98.2 F (36.8 C) 98.1 F (36.7 C)  TempSrc:    Oral  SpO2: 96% 94% 95% 97%  Weight:      Height:       General: Not in acute distress HEENT:       Eyes: PERRL, EOMI, no scleral icterus.       ENT: No discharge from the ears and nose, no pharynx injection, no tonsillar enlargement.        Neck: No  JVD, no bruit, no mass felt. Heme: No neck lymph node enlargement. Cardiac: S1/S2, RRR, No gallops or rubs. Respiratory: No rales, wheezing, rhonchi or rubs. GI: Soft, nondistended, has tenderness in right upper quadrant, no rebound pain, no organomegaly, BS present. GU: No hematuria Ext: has trace leg edema bilaterally. 1+DP/PT pulse bilaterally. Musculoskeletal: No joint deformities, No joint redness or warmth, no limitation of ROM in spin. Skin: No rashes.  Neuro: Alert, oriented X3, cranial nerves II-XII grossly intact, moves all extremities normally.   Psych: Patient is not psychotic, no suicidal or hemocidal ideation.  Labs on Admission: I have personally reviewed following labs and imaging studies  CBC: Recent Labs  Lab 07/06/21 0509 07/07/21 0904 07/12/21 0309  WBC 3.8* 3.4* 7.1  NEUTROABS  --  2.1  --   HGB 9.3* 9.5* 11.5*  HCT 27.7* 27.7* 35.2*  MCV 91.7 91.1 94.6  PLT 130* 156 154    Basic Metabolic Panel: Recent Labs  Lab 07/12/21 0309  NA 141  K 4.2  CL 109  CO2 23  GLUCOSE 135*  BUN 12  CREATININE 1.02*  CALCIUM 8.8*    GFR: Estimated Creatinine Clearance: 68.3 mL/min (A) (by C-G formula based on  SCr of 1.02 mg/dL (H)). Liver Function Tests: Recent Labs  Lab 07/12/21 0309  AST 29  ALT 20  ALKPHOS 91  BILITOT 0.9  PROT 7.1  ALBUMIN 3.7    Recent Labs  Lab 07/12/21 0309  LIPASE 22    No results for input(s): AMMONIA in the last 168 hours. Coagulation Profile: No results for input(s): INR, PROTIME in the last 168 hours. Cardiac Enzymes: No results for input(s): CKTOTAL, CKMB, CKMBINDEX, TROPONINI in the last 168 hours. BNP (last 3 results) No results for input(s): PROBNP in the last 8760 hours. HbA1C: No results for input(s): HGBA1C in the last 72 hours. CBG: No results for input(s): GLUCAP in the last 168 hours. Lipid Profile: No results for input(s): CHOL, HDL, LDLCALC, TRIG, CHOLHDL, LDLDIRECT in the last 72 hours. Thyroid  Function Tests: No results for input(s): TSH, T4TOTAL, FREET4, T3FREE, THYROIDAB in the last 72 hours. Anemia Panel: No results for input(s): VITAMINB12, FOLATE, FERRITIN, TIBC, IRON, RETICCTPCT in the last 72 hours. Urine analysis:    Component Value Date/Time   COLORURINE AMBER (A) 07/12/2021 0848   APPEARANCEUR CLOUDY (A) 07/12/2021 0848   APPEARANCEUR Clear 10/10/2014 2137   LABSPEC 1.035 (H) 07/12/2021 0848   LABSPEC 1.006 10/10/2014 2137   PHURINE 7.0 07/12/2021 0848   GLUCOSEU 50 (A) 07/12/2021 0848   GLUCOSEU Negative 10/10/2014 2137   HGBUR LARGE (A) 07/12/2021 0848   BILIRUBINUR NEGATIVE 07/12/2021 0848   BILIRUBINUR neg 05/17/2016 1408   BILIRUBINUR Negative 10/10/2014 2137   KETONESUR NEGATIVE 07/12/2021 0848   PROTEINUR 100 (A) 07/12/2021 0848   UROBILINOGEN 0.2 05/17/2016 1408   NITRITE NEGATIVE 07/12/2021 0848   LEUKOCYTESUR TRACE (A) 07/12/2021 0848   LEUKOCYTESUR Negative 10/10/2014 2137   Sepsis Labs: _0 (procalcitonin:4,lacticidven:4) ) Recent Results (from the past 240 hour(s))  Resp Panel by RT-PCR (Flu A&B, Covid) Nasopharyngeal Swab     Status: None   Collection Time: 07/12/21  9:45 AM   Specimen: Nasopharyngeal Swab; Nasopharyngeal(NP) swabs in vial transport medium  Result Value Ref Range Status   SARS Coronavirus 2 by RT PCR NEGATIVE NEGATIVE Final    Comment: (NOTE) SARS-CoV-2 target nucleic acids are NOT DETECTED.  The SARS-CoV-2 RNA is generally detectable in upper respiratory specimens during the acute phase of infection. The lowest concentration of SARS-CoV-2 viral copies this assay can detect is 138 copies/mL. A negative result does not preclude SARS-Cov-2 infection and should not be used as the sole basis for treatment or other patient management decisions. A negative result may occur with  improper specimen collection/handling, submission of specimen other than nasopharyngeal swab, presence of viral mutation(s) within the areas  targeted by this assay, and inadequate number of viral copies(<138 copies/mL). A negative result must be combined with clinical observations, patient history, and epidemiological information. The expected result is Negative.  Fact Sheet for Patients:  EntrepreneurPulse.com.au  Fact Sheet for Healthcare Providers:  IncredibleEmployment.be  This test is no t yet approved or cleared by the [place] FDA and  has been authorized for detection and/or diagnosis of SARS-CoV-2 by FDA under an Emergency Use Authorization (EUA). This EUA will remain  in effect (meaning this test can be used) for the duration of the COVID-19 declaration under Section 564(b)(1) of the Act, 21 U.S.C.section 360bbb-3(b)(1), unless the authorization is terminated  or revoked sooner.       Influenza A by PCR NEGATIVE NEGATIVE Final   Influenza B by PCR NEGATIVE NEGATIVE Final    Comment: (NOTE) The Xpert Xpress  SARS-CoV-2/FLU/RSV plus assay is intended as an aid in the diagnosis of influenza from Nasopharyngeal swab specimens and should not be used as a sole basis for treatment. Nasal washings and aspirates are unacceptable for Xpert Xpress SARS-CoV-2/FLU/RSV testing.  Fact Sheet for Patients: EntrepreneurPulse.com.au  Fact Sheet for Healthcare Providers: IncredibleEmployment.be  This test is not yet approved or cleared by the [place] FDA and has been authorized for detection and/or diagnosis of SARS-CoV-2 by FDA under an Emergency Use Authorization (EUA). This EUA will remain in effect (meaning this test can be used) for the duration of the COVID-19 declaration under Section 564(b)(1) of the Act, 21 U.S.C. section 360bbb-3(b)(1), unless the authorization is terminated or revoked.  Performed at [facility], [address] [place], [place] [address]      Radiological Exams on Admission: CT ABDOMEN PELVIS W  CONTRAST  Result Date: 07/12/2021 CLINICAL DATA:  Flank pain with kidney stone suspected EXAM: CT ABDOMEN AND PELVIS WITH CONTRAST TECHNIQUE: Multidetector CT imaging of the abdomen and pelvis was performed using the standard protocol following bolus administration of intravenous contrast. CONTRAST:  157m OMNIPAQUE IOHEXOL 300 MG/ML  SOLN COMPARISON:  07/02/2021 FINDINGS: Lower chest:  No acute finding. Hepatobiliary: No focal liver abnormality.No evidence of biliary obstruction or stone. Pancreas: Generalized atrophy Spleen: Unremarkable. Adrenals/Urinary Tract: Negative adrenals. Right hydroureteronephrosis with perinephric stranding presumably to a 3 mm right UVJ calculus suggested on coronal reformats, but the pelvis was obscured by artifact from a right hip prosthesis. Layering calcification in the left renal pelvis and present in the bilateral calices which are unchanged. Left perinephric stranding is improved. Low densities in the left renal cortex are unchanged. Collapsed bladder which is largely obscured by streak artifact. Stomach/Bowel:  No obstruction. Appendectomy. Vascular/Lymphatic: No acute vascular abnormality. Atheromatous calcification. No mass or adenopathy. Reproductive:Hysterectomy Other: No ascites or pneumoperitoneum. Musculoskeletal: No acute abnormalities. Generalized lumbar spine degeneration. Total right hip arthroplasty. IMPRESSION: 1. Right hydroureteronephrosis likely due to a 3 mm UVJ calculus, although certainty is limited by extensive artifact from a right hip prosthesis. 2. Bilateral nephrolithiasis. Electronically Signed   By: JJorje GuildM.D.   On: 07/12/2021 05:26   DG OR UROLOGY CYSTO IMAGE ([facility]  Result Date: 07/12/2021 There is no interpretation for this exam.  This order is for images obtained during a surgical procedure.  Please See "Surgeries" Tab for more information regarding the procedure.     EKG: I have personally reviewed. Atrial fibrillation,  544,  low voltage, nonspecific T wave change  Assessment/Plan Principal Problem:   Ureterovesical junction (UVJ) obstruction Active Problems:   Hypertension   Anxiety and depression   Atrial fibrillation (HCC)   Diarrhea   Acute pyelonephritis   CKD (chronic kidney disease), stage II   HLD (hyperlipidemia)   Chronic diastolic CHF (congestive heart failure) (HCC)   Hydroureteronephrosis   Acute pyelonephritis and ureterovesical junction (UVJ) obstruction and right hydroureteronephrosis: Dr. SDiamantina Providenceof urology is consulted, did urgent ureteral stent placement.  Patient has tachycardia, but no tachypnea, no fever or leukocytosis.  Does not meet criteria for sepsis  -Admitted to MedSurg bed as inpatient -IV Rocephin -Follow-up of blood culture and urine culture -IV fluid: 1 L normal saline  Hypertension -IV hydralazine as needed -Zebeta, Cardizem  Anxiety and depression -Continue home medications  Atrial fibrillation (HCC) -Cardizem -Continue Eliquis (per Dr. SDiamantina Providence we can continue Eliquis, little bit of hematuria is normal after procedure)  Diarrhea -f/u C. difficile test  CKD (chronic kidney disease),  stage II: Stable, creatinine 1.02, BUN 12 -Follow-up with BMP  HLD (hyperlipidemia) -Crestor  Chronic diastolic CHF (congestive heart failure) ([place]): 2D echo on 03/08/2021 showed EF of 60 to 65% with grade 1 diastolic dysfunction.  She has trace leg edema, no JVD.  Does not seem to have CHF exacerbation.  BNP 562 -Continue home Lasix          DVT ppx: SCD Code Status: Full code Family Communication: not done, no family member is at bed side.      Disposition Plan:  Anticipate discharge back to previous environment Consults called:  Dr. Diamantina Providence of urology Admission status and Level of care: Telemetry Medical:   as inpt       Status is: Inpatient  Remains inpatient appropriate because: Patient has multiple comorbidities, presents with acute pyelonephritis,  hydroureteronephrosis due to obstructive ureteral stone.  Patient needs ureteral stent placement.  Her presentation is highly complicated.  Patient is at high risk of deteriorating.  Need to be treated in hospital for the next 2 days            Date of Service 07/12/2021    [facility]   If 7PM-7AM, please contact night-coverage [email or web address] 07/12/2021, 6:20 PM

## 2021-07-12 NOTE — ED Notes (Signed)
Lab came and tried to get blood cultures and was unable to get them.  Dr Ellender Hose informed.

## 2021-07-12 NOTE — H&P (View-Only) (Signed)
Urology Consult   I have been asked to see the patient by Dr. Ellender Hose, for evaluation and management of renal colic, right distal ureteral stone, possible UTI, gross hematuria.  Chief Complaint: Right-sided flank pain  HPI:  Amber Barnes is a 71 y.o. year old with a number of comorbidities including morbid obesity, CHF, COPD, breast cancer on chemotherapy, and multiple recent admissions.  She presents to the ER today with 24 hours of severe right-sided flank pain that she rates 10/10.  She also is having some gross hematuria, but denies fevers or chills.  CT today shows right-sided hydronephrosis with a possible 3 mm right distal ureteral stone.  Urinalysis with >50 RBCs, >50 WBC, rare bacteria, trace leukocytes, nitrite negative.  WBC and creatinine up slightly from baseline.  She was recently admitted from 12/23 through 12/28 with left-sided flank pain and abdominal pain, gross hematuria, possible left pyelonephritis, possible left renal cyst rupture.  CT at that time showed mild left hydronephrosis with perinephric stranding, and pelvic imaging limited secondary to right hip prosthesis.  Dr. Gloriann Loan with urology was consulted at that time and recommended holding Eliquis, and outpatient hematuria work-up with CT urogram and cystoscopy.  She improved on antibiotics and was ultimately discharged on 12/28.  Unfortunately urine culture was not sent during that hospitalization.  She was discharged on Cipro, and had held her Eliquis until resuming on 07/09/2021.  PMH: Past Medical History:  Diagnosis Date   Achilles tendinitis    Acute medial meniscal tear    Allergy    Anxiety    Atrial fibrillation ([place])    a. new onset 07/2016; b. CHADS2VASc --> 4 (HTN, female, age, CHF); c. eliquis   CHF (congestive heart failure) (HCC)    Chronic anticoagulation    Apixaban   Chronic lower back pain    Colon polyp    Complication of anesthesia    Emergence delirium; hypoxia   COPD (chronic  obstructive pulmonary disease) ([place])    Cystic breast    Depression    Endometriosis    Fibrocystic breast disease    GERD (gastroesophageal reflux disease)    History of stress test    a. 07/2016 MV: EF 55-65%. Small, mild defect  in mid anteroseptal, apical septal, and apical locations. No ischemia-->low risk.   Hyperlipidemia    Hypertension    Insomnia    Lipoma    Malignant neoplasm of upper-outer quadrant of right female breast (HCC)    Stage 1A invasive mammary carcinoma (cT1b or T1c N0); ER (-), PR (+), HER2/neu (+)   Migraine    Mild Mitral regurgitation    a. 07/2016 Echo: EF 55-65%, no rwma, mild MR. Mildly dil LA. Nl RV fx.    OSA (obstructive sleep apnea)    non-compliant with nocturnal PAP therapy   Osteoarthritis    left knee   Osteopenia    Peptic ulcer disease    a. H. pylori   Pneumonia    RLS (restless legs syndrome)    Tendonitis    Thyroid disease    Vitamin D deficiency     Surgical History: Past Surgical History:  Procedure Laterality Date   ABDOMINAL HYSTERECTOMY     ovaries left   ACHILLES TENDON SURGERY     2012,01/2017   BACK SURGERY     fusion lumbar   BREAST BIOPSY Right 01/15/2021   Affirm biopsy-"X" clip-INVASIVE MAMMARY CARCINOMA   BREAST BIOPSY Right 01/15/2021   u/s bx-"venus" clip INVASIVE  MAMMARY CARCINOMA   BREAST BIOPSY     BREAST LUMPECTOMY,RADIO FREQ LOCALIZER,AXILLARY SENTINEL LYMPH NODE BIOPSY Right 02/17/2021   Procedure: BREAST LUMPECTOMY,RADIO FREQ LOCALIZER,AXILLARY SENTINEL LYMPH NODE BIOPSY;  Surgeon: Ronny Bacon, MD;  Location: [facility];  Service: General;  Laterality: Right;   CARDIOVERSION N/A 09/07/2016   Procedure: CARDIOVERSION;  Surgeon: Minna Merritts, MD;  Location: [facility];  Service: Cardiovascular;  Laterality: N/A;   CARDIOVERSION N/A 10/19/2016   Procedure: CARDIOVERSION;  Surgeon: Minna Merritts, MD;  Location: [facility];  Service: Cardiovascular;  Laterality: N/A;   CARDIOVERSION N/A 12/14/2016    Procedure: CARDIOVERSION;  Surgeon: Minna Merritts, MD;  Location: [facility];  Service: Cardiovascular;  Laterality: N/A;   COLONOSCOPY     COLONOSCOPY WITH PROPOFOL N/A 05/13/2020   Procedure: COLONOSCOPY WITH PROPOFOL;  Surgeon: Toledo, Benay Pike, MD;  Location: [facility];  Service: Gastroenterology;  Laterality: N/A;  ELIQUIS   ESOPHAGOGASTRODUODENOSCOPY     ESOPHAGOGASTRODUODENOSCOPY (EGD) WITH PROPOFOL N/A 05/13/2020   Procedure: ESOPHAGOGASTRODUODENOSCOPY (EGD) WITH PROPOFOL;  Surgeon: Toledo, Benay Pike, MD;  Location: [facility];  Service: Gastroenterology;  Laterality: N/A;   FOOT SURGERY Left    tendon   JOINT REPLACEMENT Right    hip   PORTA CATH INSERTION N/A 03/23/2021   Procedure: PORTA CATH INSERTION;  Surgeon: Katha Cabal, MD;  Location: [facility];  Service: Cardiovascular;  Laterality: N/A;   PORTACATH PLACEMENT N/A 03/23/2021   Procedure: ATTEMPTED INSERTION PORT-A-CATH;  Surgeon: Ronny Bacon, MD;  Location: [facility];  Service: General;  Laterality: N/A;   THYROIDECTOMY     thyroid lobectomy secondary to a benign nodule   TOTAL HIP ARTHROPLASTY Right 08/21/2018   Procedure: TOTAL HIP ARTHROPLASTY ANTERIOR APPROACH-RIGHT CELL SAVER REQUESTED;  Surgeon: [name], MD;  Location: [facility];  Service: Orthopedics;  Laterality: Right;     Allergies:  Allergies  Allergen Reactions   Prednisone Other (See Comments)    Agitation, hyperactivity, polyphagia   Amiodarone Nausea And Vomiting   Hydrocodone-Acetaminophen Other (See Comments)    Hallucinations and combative   Oxycodone Itching    Can take it with benadryl   Tapentadol Itching   Tramadol Itching    Can take it with benadryl   Adhesive [Tape] Itching and Rash    Prefers paper tape   Latex Itching and Rash    use paper tap    Family History: Family History  Problem Relation Age of Onset   Cancer Sister 73       ovarian   Depression Sister    Cancer Brother        CLL    Cancer Father 71       colon   Heart disease Father    Heart attack Father 27       died from MI   Hyperlipidemia Father    Hypertension Father    Cancer Mother        leukemia   Breast cancer Paternal Aunt        94's   Migraines Neg Hx     Social History:  reports that she quit smoking about 18 years ago. Her smoking use included cigarettes. She has a 2.50 pack-year smoking history. She has never used smokeless tobacco. She reports that she does not currently use alcohol after a past usage of about 1.0 standard drink per week. She reports that she does not use drugs.  ROS: Negative aside from those stated in the HPI.  Physical Exam: BP (!) 161/76 (BP Location: Left Arm)    Pulse 70    Temp 98.5 F (36.9 C) (Oral)    Resp 18    Ht _0  (1.651 m)    Wt 119.6 kg    SpO2 97%    BMI 43.88 kg/m    Constitutional: Chronically ill-appearing Cardiovascular: Regular rate and rhythm Respiratory: Clear bilaterally, diminished at the bases GI: Abdomen is soft, nontender, nondistended, no abdominal masses GU: Right CVA tenderness   Laboratory Data: Reviewed in epic, see HPI  Pertinent Imaging: I have personally reviewed both CT scans from 07/02/2021 as well as from today.  Today's scan shows mild to moderate right-sided hydronephrosis with a possible 3 mm right distal ureteral stone, but pelvic imaging is limited by hip prosthesis.  In comparison to the prior scan, the left-sided hydronephrosis has resolved, however it appears that the right distal ureteral stone may have been present on that original CT.  There are also some small right-sided nonobstructive renal stones.  Assessment & Plan:   Very comorbid(morbid obesity, CHF, COPD, breast cancer on chemotherapy) 71 year old female with recent prolonged hospitalization for suspected left pyelonephritis, who presents with acute onset of right-sided flank pain.  CT limited by hip prosthesis, but appears to have a 3 mm right distal  ureteral stone that may have been present since at least 07/02/2021, and may have been contributing to her prior hospitalization.  Urinalysis also equivocal with significant pyuria, and with her ongoing chemotherapy, to decrease in renal function, increase in WBC, and poorly controlled pain, I recommended proceeding to the operating room for right ureteral stent placement, possible basket extraction of right distal ureteral stone.  I also recommended a left retrograde pyelogram at that time to evaluate her left collecting system after recent hospitalization for left-sided flank pain and hydronephrosis of clear etiology.  We discussed other options including observation, but with her multiple recent hospitalizations for abdominal pain and immunosuppression and chemotherapy, I recommended proceeding to the operating room.  We also discussed possible need for additional procedures including biopsy/TURBT if any bladder lesions are found since she is anticoagulated at this time, as well as potential need for ureteroscopy for definitive management in the future if stent is placed today.  Recommendations: -Agree with admission for antibiotics and resuscitation, pain control -OR today for cystoscopy, bilateral retrograde pyelograms, right ureteral stent placement, possible right ureteroscopy and stone removal  Billey Co, MD  Total time spent on the floor was 80 minutes, with greater than 50% spent in counseling and coordination of care with the patient regarding multiple recent hospitalizations, abdominal pain, CT findings, suspected right distal ureteral stone, possible UTI/pyelonephritis.  [facility] [address] [place], [place] [address] [phone]

## 2021-07-12 NOTE — ED Triage Notes (Addendum)
Pt arrived via [facility] from home where she called out due to upper epigastric pain that radiates into her back and into her lower back at kidney level. Pt recently hospitalized for same symptoms and diagnosed with UTI and a ruptured cyst on kidney. Pt sts she completed her last antibiotic last night. Pt with hx/o A-FIb and EMS reports A-Fib on monitor at rate of 107bpm. Pt denies SOB. Pt actively receiving chemotherapy for breast cancer with last treatment 2 weeks ago.

## 2021-07-12 NOTE — Anesthesia Postprocedure Evaluation (Signed)
Anesthesia Post Note  Patient: Amber Barnes  Procedure(s) Performed: CYSTOSCOPY WITH STENT PLACEMENT (Bilateral)  Patient location during evaluation: PACU Anesthesia Type: General Level of consciousness: awake and alert Pain management: pain level controlled Vital Signs Assessment: post-procedure vital signs reviewed and stable Respiratory status: spontaneous breathing, nonlabored ventilation, respiratory function stable and patient connected to nasal cannula oxygen Cardiovascular status: blood pressure returned to baseline and stable Postop Assessment: no apparent nausea or vomiting Anesthetic complications: no   No notable events documented.   Last Vitals:  Vitals:   07/12/21 1351 07/12/21 1400  BP: 121/73 124/81  Pulse:  71  Resp: 10 14  Temp: (!) 36.1 C   SpO2: 100% 100%    Last Pain:  Vitals:   07/12/21 1351  TempSrc:   PainSc: Asleep                 Arita Miss

## 2021-07-12 NOTE — ED Provider Notes (Signed)
[facility] Provider Note    Event Date/Time   First MD Initiated Contact with Patient 07/12/21 [id number]     (approximate)   History   Abdominal Pain   HPI  Amber Barnes is a 71 y.o. female with past medical history of morbid obesity, A. fib RVR, COPD, breast cancer on chemo here with right-sided flank pain.  The patient was just admitted for possible pyelonephritis and treated with antibiotics and given fluids.  She states she had been recovering well.  However, over the last 12 hours, she has developed aching, throbbing, right flank pain.  She has developed associated nausea, vomiting, and general fatigue.  She states the pain is 10 out of 10, localized on the right flank, that did improve somewhat when she was given analgesia in the waiting room.  Denies history of kidney stones.  Pain feels different than it did when she was admitted here, and it was on the left when she was admitted.  Denies history of recurrent UTIs.  She does take chemotherapy, does not know what her last neutrophil count was.  No other recent medication changes.  No known history of kidney stones.     Physical Exam   Triage Vital Signs: ED Triage Vitals  Enc Vitals Group     BP 07/12/21 0301 (!) 155/123     Pulse Rate 07/12/21 0301 (!) 111     Resp 07/12/21 0301 20     Temp 07/12/21 0400 97.6 F (36.4 C)     Temp Source 07/12/21 0301 Oral     SpO2 07/12/21 0301 98 %     Weight --      Height --      Head Circumference --      Peak Flow --      Pain Score 07/12/21 0700 10     Pain Loc --      Pain Edu? --      Excl. in [place]? --     Most recent vital signs: Vitals:   07/12/21 0825 07/12/21 1032  BP: 133/70 (!) 161/76  Pulse: 70   Resp: 18   Temp: 98.5 F (36.9 C)   SpO2: 97%      General: Awake, no distress.  CV:  Good peripheral perfusion.  Resp:  Normal effort.  Abd:  No distention.  Moderate right upper quadrant and right lower quadrant tenderness to palpation.   No overt right CVA tenderness. Other:  Well-appearing, no distress.  Left upper port site is clean, dry, intact.  No rashes.  No skin lesions over the flank or abdomen.   ED Results / Procedures / Treatments   Labs (all labs ordered are listed, but only abnormal results are displayed) Labs Reviewed  COMPREHENSIVE METABOLIC PANEL - Abnormal; Notable for the following components:      Result Value   Glucose, Bld 135 (*)    Creatinine, Ser 1.02 (*)    Calcium 8.8 (*)    GFR, Estimated 60 (*)    All other components within normal limits  CBC - Abnormal; Notable for the following components:   RBC 3.72 (*)    Hemoglobin 11.5 (*)    HCT 35.2 (*)    All other components within normal limits  URINALYSIS, ROUTINE W REFLEX MICROSCOPIC - Abnormal; Notable for the following components:   Color, Urine AMBER (*)    APPearance CLOUDY (*)    Specific Gravity, Urine 1.035 (*)    Glucose, UA 50 (*)  Hgb urine dipstick LARGE (*)    Protein, ur 100 (*)    Leukocytes,Ua TRACE (*)    RBC / HPF >50 (*)    WBC, UA >50 (*)    Bacteria, UA RARE (*)    All other components within normal limits  RESP PANEL BY RT-PCR (FLU A&B, COVID) ARPGX2  URINE CULTURE  CULTURE, BLOOD (ROUTINE X 2)  CULTURE, BLOOD (ROUTINE X 2)  LIPASE, BLOOD  BRAIN NATRIURETIC PEPTIDE  PROTIME-INR  APTT  TROPONIN I (HIGH SENSITIVITY)  TROPONIN I (HIGH SENSITIVITY)     EKG  Atrial fibrillation with rapid ventricular response.  Ventricular rate 111.  QRS 88, QTc 544.  No acute ST elevations or depressions.  EKG evidence of acute ischemia or infarct.   RADIOLOGY CT abdomen and pelvis: Images reviewed by me, concerning for right hydronephrosis with possible UPJ calculus; reviewed images and read from radiologist as well and agree.    PROCEDURES:  Critical Care performed: No  .1-3 Lead EKG Interpretation Performed by: Duffy Bruce, MD Authorized by: Duffy Bruce, MD     Interpretation: normal     ECG rate:   70-90   ECG rate assessment: normal     Rhythm: sinus rhythm     Ectopy: none     Conduction: normal   Comments:     Indication: Flank pain   MEDICATIONS ORDERED IN ED: Medications  albuterol (PROVENTIL) (2.5 MG/3ML) 0.083% nebulizer solution 2.5 mg (has no administration in time range)  fentaNYL (SUBLIMAZE) injection 25 mcg (has no administration in time range)  hydrALAZINE (APRESOLINE) injection 5 mg (has no administration in time range)  diphenhydrAMINE (BENADRYL) injection 12.5 mg (has no administration in time range)  dextromethorphan-guaiFENesin (MUCINEX DM) 30-600 MG per 12 hr tablet 1 tablet (has no administration in time range)  cefTRIAXone (ROCEPHIN) 1 g in sodium chloride 0.9 % 100 mL IVPB (has no administration in time range)  0.9 %  sodium chloride infusion (has no administration in time range)  ondansetron (ZOFRAN) injection 4 mg (4 mg Intravenous Given 07/12/21 0355)  fentaNYL (SUBLIMAZE) injection 50 mcg (50 mcg Intravenous Given 07/12/21 0356)  iohexol (OMNIPAQUE) 300 MG/ML solution 100 mL (100 mLs Intravenous Contrast Given 07/12/21 0441)  ondansetron (ZOFRAN) injection 4 mg (4 mg Intravenous Given 07/12/21 0659)  fentaNYL (SUBLIMAZE) injection 50 mcg (50 mcg Intravenous Given 07/12/21 0659)  cefTRIAXone (ROCEPHIN) 2 g in sodium chloride 0.9 % 100 mL IVPB (2 g Intravenous New Bag/Given 07/12/21 1029)  sodium chloride 0.9 % bolus 1,000 mL (1,000 mLs Intravenous New Bag/Given 07/12/21 1029)     IMPRESSION / MDM / ASSESSMENT AND PLAN / ED COURSE  I reviewed the triage vital signs and the nursing notes.                              Differential diagnosis includes, but is not limited to, kidney stone, pyelonephritis, cholecystitis, hepatitis, colitis, atypical chest pain/ischemia, pneumonia, ovarian pathology.  The patient is on the cardiac monitor to evaluate for evidence of arrhythmia and/or significant heart rate changes.  71 year old female here with right-sided abdominal  pain.  Patient hemodynamically stable.  Lab work concerning for possible UTI with significant pyuria and hematuria and rare bacteria.  Reviewed recent admission, patient was recently treated for possible UTI as well as A. fib RVR.  Query on treated or recurrent UTI.  CBC shows no significant leukocytosis but does show doubling of her white count from  her baseline leukopenia related to her chemotherapy.  LFTs normal.  Creatinine normal at 1.02, though again doubled from her baseline consistent with mild dehydration.  CT reviewed as above, shows right-sided hydro nephrosis.  Patient has an obstructing stone on the right.  EKG nonischemic and troponin negative, do not suspect ACS or referred cardiac pain.  COVID-negative.  Discussed case with Dr. Diamantina Providence of urology, who will take the patient to the OR for stent placement given concern for possible infected stone.  Discussed with Dr. [name] Hamper of hospitalist service who will admit.  Patient in agreement with this plan.      FINAL CLINICAL IMPRESSION(S) / ED DIAGNOSES   Final diagnoses:  Kidney stone on right side  Acute cystitis with hematuria  Dehydration     Rx / DC Orders   ED Discharge Orders     None        Note:  This document was prepared using Dragon voice recognition software and may include unintentional dictation errors.   Duffy Bruce, MD 07/12/21 1155

## 2021-07-12 NOTE — Transfer of Care (Signed)
Immediate Anesthesia Transfer of Care Note  Patient: Amber Barnes  Procedure(s) Performed: CYSTOSCOPY/RETROGRADE/URETEROSCOPY (Right)  Patient Location: PACU  Anesthesia Type:General  Level of Consciousness: awake  Airway & Oxygen Therapy: Patient Spontanous Breathing and Patient connected to face mask oxygen  Post-op Assessment: Report given to RN and Post -op Vital signs reviewed and stable  Post vital signs: Reviewed  Last Vitals:  Vitals Value Taken Time  BP 121/73 07/12/21 1351  Temp    Pulse 76 07/12/21 1352  Resp 19 07/12/21 1352  SpO2 100 % 07/12/21 1352  Vitals shown include unvalidated device data.  Last Pain:         Complications: No notable events documented.

## 2021-07-12 NOTE — Op Note (Addendum)
Date of procedure: 07/12/21  Preoperative diagnosis:  Right hydronephrosis UTI/right pyelonephritis  Postoperative diagnosis:  Right hydronephrosis Left hydronephrosis UTI/pyelonephritis  Procedure: Cystoscopy Left ureteral stent placement Right ureteral stent placement  Surgeon: Nickolas Madrid, MD  Anesthesia: General  Complications: None  Intraoperative findings:  No suspicious bladder lesions, mild to moderate trabeculations On scout KUB, bilateral moderate hydronephrosis with retained renal contrast from CT scan at 0400 this morning Bilateral ureteral stent placement, purulent drainage from right kidney  EBL: None  Specimens: None  Drains: Bilateral 6 French by 24 cm ureteral stents  Indication: Amber Barnes is a 71 y.o. patient with multiple recent hospitalizations for left-sided pyelonephritis, hematuria who was admitted this morning with right-sided flank pain.  CT showed a right distal ureteral stone, urinalysis was concerning for infection, and she opted for stent placement.  After reviewing the management options for treatment, they elected to proceed with the above surgical procedure(s). We have discussed the potential benefits and risks of the procedure, side effects of the proposed treatment, the likelihood of the patient achieving the goals of the procedure, and any potential problems that might occur during the procedure or recuperation. Informed consent has been obtained.  Description of procedure:  The patient was taken to the operating room and general anesthesia was induced. SCDs were placed for DVT prophylaxis. The patient was placed in the dorsal lithotomy position, prepped and draped in the usual sterile fashion, and preoperative antibiotics(ceftriaxone in ED) were administered. A preoperative time-out was performed.   A 21 French rigid cystoscope was used to intubate the urethra and thorough cystoscopy was performed.  The bladder was grossly normal  with no tumors or lesions, and there were mild to moderate trabeculations.  The ureteral orifices were orthotopic bilaterally.  A scout KUB showed retained contrast bilaterally with moderate hydronephrosis on both sides.  At this point with her recent hospitalization for left-sided pyelonephritis/hydronephrosis and her current right distal ureteral stone with concern for infection, I opted to place bilateral ureteral stents to optimize drainage.  A sensor wire advanced easily into the left ureteral orifice and passed up to the kidney under fluoroscopic vision.  A 6 French by 24 cm ureteral stent was uneventfully placed with an excellent curl in the renal pelvis, as well as under direct vision the bladder.  A sensor wire was then advanced easily into the right ureteral orifice and passed up to the kidney under fluoroscopic vision.  Purulent urine drained alongside the wire.  A 6 French by 24 cm ureteral stent was placed with an excellent curl in the right renal pelvis, as well as under direct vision the bladder.  There was brisk drainage of purulent and debris-filled urine through the side ports of the stent.  The bladder was drained and this concluded our procedure.  Disposition: Stable to PACU  Plan: Agree with admission to hospitalist for antibiotics and resuscitation, follow-up urine culture We will plan for outpatient bilateral ureteroscopy, laser lithotripsy, stent exchange in 2 to 4 weeks when clinically improved  Nickolas Madrid, MD

## 2021-07-12 NOTE — ED Notes (Signed)
Updated pt on wait times.

## 2021-07-12 NOTE — Anesthesia Preprocedure Evaluation (Signed)
Anesthesia Evaluation  Patient identified by MRN, date of birth, ID band Patient awake    Reviewed: Allergy & Precautions, NPO status , Patient's Chart, lab work & pertinent test results  History of Anesthesia Complications (+) Emergence Delirium and history of anesthetic complications  Airway Mallampati: III  TM Distance: >3 FB Neck ROM: Full    Dental  (+) Poor Dentition, Missing, Chipped   Pulmonary sleep apnea , COPD,  COPD inhaler, Patient abstained from smoking.Not current smoker, former smoker,    Pulmonary exam normal breath sounds clear to auscultation       Cardiovascular Exercise Tolerance: Poor METShypertension, Pt. on medications +CHF  (-) CAD and (-) Past MI (-) dysrhythmias  Rhythm:Regular Rate:Normal - Systolic murmurs 1. Left ventricular ejection fraction, by estimation, is 60 to 65%. The  left ventricle has normal function. Left ventricular endocardial border  not optimally defined to evaluate regional wall motion. There is mild left  ventricular hypertrophy. Left  ventricular diastolic parameters are consistent with Grade I diastolic  dysfunction (impaired relaxation). The average left ventricular global  longitudinal strain is -16.3 %. The global longitudinal strain is normal.  2. Right ventricular systolic function is normal. The right ventricular  size is normal.  3. Left atrial size was mildly dilated.  4. The mitral valve is normal in structure. Trivial mitral valve  regurgitation. No evidence of mitral stenosis.  5. The aortic valve is normal in structure. Aortic valve regurgitation is  not visualized. No aortic stenosis is present.    Neuro/Psych  Headaches, PSYCHIATRIC DISORDERS Anxiety Depression    GI/Hepatic PUD, GERD  ,(+)     (-) substance abuse  ,   Endo/Other  neg diabetesMorbid obesity  Renal/GU CRF and ARFRenal disease     Musculoskeletal  (+) Arthritis ,   Abdominal (+)  + obese,   Peds  Hematology   Anesthesia Other Findings Past Medical History: No date: Achilles tendinitis No date: Acute medial meniscal tear No date: Allergy No date: Anxiety No date: Atrial fibrillation [facility])     Comment:  a. new onset 07/2016; b. CHADS2VASc --> 4 (HTN, female,               age, CHF); c. eliquis No date: CHF (congestive heart failure) ([place]) No date: Chronic anticoagulation     Comment:  Apixaban No date: Chronic lower back pain No date: Colon polyp No date: Complication of anesthesia     Comment:  Emergence delirium; hypoxia No date: COPD (chronic obstructive pulmonary disease) (HCC) No date: Cystic breast No date: Depression No date: Endometriosis No date: Fibrocystic breast disease No date: GERD (gastroesophageal reflux disease) No date: History of stress test     Comment:  a. 07/2016 MV: EF 55-65%. Small, mild defect  in mid               anteroseptal, apical septal, and apical locations. No               ischemia-->low risk. No date: Hyperlipidemia No date: Hypertension No date: Insomnia No date: Lipoma No date: Malignant neoplasm of upper-outer quadrant of right female  breast (HCC)     Comment:  Stage 1A invasive mammary carcinoma (cT1b or T1c N0); ER              (-), PR (+), HER2/neu (+) No date: Migraine No date: Mild Mitral regurgitation     Comment:  a. 07/2016 Echo: EF 55-65%, no rwma, mild MR. Mildly dil  LA. Nl RV fx.  No date: OSA (obstructive sleep apnea)     Comment:  non-compliant with nocturnal PAP therapy No date: Osteoarthritis     Comment:  left knee No date: Osteopenia No date: Peptic ulcer disease     Comment:  a. H. pylori No date: Pneumonia No date: RLS (restless legs syndrome) No date: Tendonitis No date: Thyroid disease No date: Vitamin D deficiency  Reproductive/Obstetrics                             Anesthesia Physical Anesthesia Plan  ASA: 3 and emergent  Anesthesia Plan:  General   Post-op Pain Management: Ofirmev IV (intra-op)   Induction: Intravenous  PONV Risk Score and Plan: 4 or greater and Ondansetron, Dexamethasone and Treatment may vary due to age or medical condition  Airway Management Planned: Oral ETT  Additional Equipment: None  Intra-op Plan:   Post-operative Plan: Extubation in OR  Informed Consent: I have reviewed the patients History and Physical, chart, labs and discussed the procedure including the risks, benefits and alternatives for the proposed anesthesia with the patient or authorized representative who has indicated his/her understanding and acceptance.     Dental advisory given  Plan Discussed with: CRNA and Surgeon  Anesthesia Plan Comments: (Discussed risks of anesthesia with patient, including PONV, sore throat, lip/dental/eye damage. Rare risks discussed as well, such as cardiorespiratory and neurological sequelae, and allergic reactions. Discussed the role of CRNA in patient's perioperative care. Patient understands. Patient informed about increased incidence of above perioperative risk due to high BMI. Patient understands. )        Anesthesia Quick Evaluation

## 2021-07-12 NOTE — Consult Note (Signed)
Urology Consult   I have been asked to see the patient by Dr. Ellender Hose, for evaluation and management of renal colic, right distal ureteral stone, possible UTI, gross hematuria.  Chief Complaint: Right-sided flank pain  HPI:  Amber Barnes is a 71 y.o. year old with a number of comorbidities including morbid obesity, CHF, COPD, breast cancer on chemotherapy, and multiple recent admissions.  She presents to the ER today with 24 hours of severe right-sided flank pain that she rates 10/10.  She also is having some gross hematuria, but denies fevers or chills.  CT today shows right-sided hydronephrosis with a possible 3 mm right distal ureteral stone.  Urinalysis with >50 RBCs, >50 WBC, rare bacteria, trace leukocytes, nitrite negative.  WBC and creatinine up slightly from baseline.  She was recently admitted from 12/23 through 12/28 with left-sided flank pain and abdominal pain, gross hematuria, possible left pyelonephritis, possible left renal cyst rupture.  CT at that time showed mild left hydronephrosis with perinephric stranding, and pelvic imaging limited secondary to right hip prosthesis.  Dr. Gloriann Loan with urology was consulted at that time and recommended holding Eliquis, and outpatient hematuria work-up with CT urogram and cystoscopy.  She improved on antibiotics and was ultimately discharged on 12/28.  Unfortunately urine culture was not sent during that hospitalization.  She was discharged on Cipro, and had held her Eliquis until resuming on 07/09/2021.  PMH: Past Medical History:  Diagnosis Date   Achilles tendinitis    Acute medial meniscal tear    Allergy    Anxiety    Atrial fibrillation ([place])    a. new onset 07/2016; b. CHADS2VASc --> 4 (HTN, female, age, CHF); c. eliquis   CHF (congestive heart failure) (HCC)    Chronic anticoagulation    Apixaban   Chronic lower back pain    Colon polyp    Complication of anesthesia    Emergence delirium; hypoxia   COPD (chronic  obstructive pulmonary disease) ([place])    Cystic breast    Depression    Endometriosis    Fibrocystic breast disease    GERD (gastroesophageal reflux disease)    History of stress test    a. 07/2016 MV: EF 55-65%. Small, mild defect  in mid anteroseptal, apical septal, and apical locations. No ischemia-->low risk.   Hyperlipidemia    Hypertension    Insomnia    Lipoma    Malignant neoplasm of upper-outer quadrant of right female breast (HCC)    Stage 1A invasive mammary carcinoma (cT1b or T1c N0); ER (-), PR (+), HER2/neu (+)   Migraine    Mild Mitral regurgitation    a. 07/2016 Echo: EF 55-65%, no rwma, mild MR. Mildly dil LA. Nl RV fx.    OSA (obstructive sleep apnea)    non-compliant with nocturnal PAP therapy   Osteoarthritis    left knee   Osteopenia    Peptic ulcer disease    a. H. pylori   Pneumonia    RLS (restless legs syndrome)    Tendonitis    Thyroid disease    Vitamin D deficiency     Surgical History: Past Surgical History:  Procedure Laterality Date   ABDOMINAL HYSTERECTOMY     ovaries left   ACHILLES TENDON SURGERY     2012,01/2017   BACK SURGERY     fusion lumbar   BREAST BIOPSY Right 01/15/2021   Affirm biopsy-"X" clip-INVASIVE MAMMARY CARCINOMA   BREAST BIOPSY Right 01/15/2021   u/s bx-"venus" clip INVASIVE  MAMMARY CARCINOMA   BREAST BIOPSY     BREAST LUMPECTOMY,RADIO FREQ LOCALIZER,AXILLARY SENTINEL LYMPH NODE BIOPSY Right 02/17/2021   Procedure: BREAST LUMPECTOMY,RADIO FREQ LOCALIZER,AXILLARY SENTINEL LYMPH NODE BIOPSY;  Surgeon: Ronny Bacon, MD;  Location: [facility];  Service: General;  Laterality: Right;   CARDIOVERSION N/A 09/07/2016   Procedure: CARDIOVERSION;  Surgeon: Minna Merritts, MD;  Location: [facility];  Service: Cardiovascular;  Laterality: N/A;   CARDIOVERSION N/A 10/19/2016   Procedure: CARDIOVERSION;  Surgeon: Minna Merritts, MD;  Location: [facility];  Service: Cardiovascular;  Laterality: N/A;   CARDIOVERSION N/A 12/14/2016    Procedure: CARDIOVERSION;  Surgeon: Minna Merritts, MD;  Location: [facility];  Service: Cardiovascular;  Laterality: N/A;   COLONOSCOPY     COLONOSCOPY WITH PROPOFOL N/A 05/13/2020   Procedure: COLONOSCOPY WITH PROPOFOL;  Surgeon: Toledo, Benay Pike, MD;  Location: [facility];  Service: Gastroenterology;  Laterality: N/A;  ELIQUIS   ESOPHAGOGASTRODUODENOSCOPY     ESOPHAGOGASTRODUODENOSCOPY (EGD) WITH PROPOFOL N/A 05/13/2020   Procedure: ESOPHAGOGASTRODUODENOSCOPY (EGD) WITH PROPOFOL;  Surgeon: Toledo, Benay Pike, MD;  Location: [facility];  Service: Gastroenterology;  Laterality: N/A;   FOOT SURGERY Left    tendon   JOINT REPLACEMENT Right    hip   PORTA CATH INSERTION N/A 03/23/2021   Procedure: PORTA CATH INSERTION;  Surgeon: Katha Cabal, MD;  Location: [facility];  Service: Cardiovascular;  Laterality: N/A;   PORTACATH PLACEMENT N/A 03/23/2021   Procedure: ATTEMPTED INSERTION PORT-A-CATH;  Surgeon: Ronny Bacon, MD;  Location: [facility];  Service: General;  Laterality: N/A;   THYROIDECTOMY     thyroid lobectomy secondary to a benign nodule   TOTAL HIP ARTHROPLASTY Right 08/21/2018   Procedure: TOTAL HIP ARTHROPLASTY ANTERIOR APPROACH-RIGHT CELL SAVER REQUESTED;  Surgeon: [name], MD;  Location: [facility];  Service: Orthopedics;  Laterality: Right;     Allergies:  Allergies  Allergen Reactions   Prednisone Other (See Comments)    Agitation, hyperactivity, polyphagia   Amiodarone Nausea And Vomiting   Hydrocodone-Acetaminophen Other (See Comments)    Hallucinations and combative   Oxycodone Itching    Can take it with benadryl   Tapentadol Itching   Tramadol Itching    Can take it with benadryl   Adhesive [Tape] Itching and Rash    Prefers paper tape   Latex Itching and Rash    use paper tap    Family History: Family History  Problem Relation Age of Onset   Cancer Sister 73       ovarian   Depression Sister    Cancer Brother        CLL    Cancer Father 71       colon   Heart disease Father    Heart attack Father 27       died from MI   Hyperlipidemia Father    Hypertension Father    Cancer Mother        leukemia   Breast cancer Paternal Aunt        94's   Migraines Neg Hx     Social History:  reports that she quit smoking about 18 years ago. Her smoking use included cigarettes. She has a 2.50 pack-year smoking history. She has never used smokeless tobacco. She reports that she does not currently use alcohol after a past usage of about 1.0 standard drink per week. She reports that she does not use drugs.  ROS: Negative aside from those stated in the HPI.  Physical Exam: BP (!) 161/76 (BP Location: Left Arm)    Pulse 70    Temp 98.5 F (36.9 C) (Oral)    Resp 18    Ht _0  (1.651 m)    Wt 119.6 kg    SpO2 97%    BMI 43.88 kg/m    Constitutional: Chronically ill-appearing Cardiovascular: Regular rate and rhythm Respiratory: Clear bilaterally, diminished at the bases GI: Abdomen is soft, nontender, nondistended, no abdominal masses GU: Right CVA tenderness   Laboratory Data: Reviewed in epic, see HPI  Pertinent Imaging: I have personally reviewed both CT scans from 07/02/2021 as well as from today.  Today's scan shows mild to moderate right-sided hydronephrosis with a possible 3 mm right distal ureteral stone, but pelvic imaging is limited by hip prosthesis.  In comparison to the prior scan, the left-sided hydronephrosis has resolved, however it appears that the right distal ureteral stone may have been present on that original CT.  There are also some small right-sided nonobstructive renal stones.  Assessment & Plan:   Very comorbid(morbid obesity, CHF, COPD, breast cancer on chemotherapy) 71 year old female with recent prolonged hospitalization for suspected left pyelonephritis, who presents with acute onset of right-sided flank pain.  CT limited by hip prosthesis, but appears to have a 3 mm right distal  ureteral stone that may have been present since at least 07/02/2021, and may have been contributing to her prior hospitalization.  Urinalysis also equivocal with significant pyuria, and with her ongoing chemotherapy, to decrease in renal function, increase in WBC, and poorly controlled pain, I recommended proceeding to the operating room for right ureteral stent placement, possible basket extraction of right distal ureteral stone.  I also recommended a left retrograde pyelogram at that time to evaluate her left collecting system after recent hospitalization for left-sided flank pain and hydronephrosis of clear etiology.  We discussed other options including observation, but with her multiple recent hospitalizations for abdominal pain and immunosuppression and chemotherapy, I recommended proceeding to the operating room.  We also discussed possible need for additional procedures including biopsy/TURBT if any bladder lesions are found since she is anticoagulated at this time, as well as potential need for ureteroscopy for definitive management in the future if stent is placed today.  Recommendations: -Agree with admission for antibiotics and resuscitation, pain control -OR today for cystoscopy, bilateral retrograde pyelograms, right ureteral stent placement, possible right ureteroscopy and stone removal  Billey Co, MD  Total time spent on the floor was 80 minutes, with greater than 50% spent in counseling and coordination of care with the patient regarding multiple recent hospitalizations, abdominal pain, CT findings, suspected right distal ureteral stone, possible UTI/pyelonephritis.  [facility] [address] [place], [place] [address] [phone]

## 2021-07-13 ENCOUNTER — Inpatient Hospital Stay: Payer: Medicare Other

## 2021-07-13 ENCOUNTER — Inpatient Hospital Stay: Payer: Medicare Other | Admitting: Family

## 2021-07-13 ENCOUNTER — Encounter: Payer: Self-pay | Admitting: Urology

## 2021-07-13 DIAGNOSIS — N135 Crossing vessel and stricture of ureter without hydronephrosis: Secondary | ICD-10-CM | POA: Diagnosis not present

## 2021-07-13 LAB — CBC
HCT: 28.9 % — ABNORMAL LOW (ref 36.0–46.0)
Hemoglobin: 9.4 g/dL — ABNORMAL LOW (ref 12.0–15.0)
MCH: 30.3 pg (ref 26.0–34.0)
MCHC: 32.5 g/dL (ref 30.0–36.0)
MCV: 93.2 fL (ref 80.0–100.0)
Platelets: 222 10*3/uL (ref 150–400)
RBC: 3.1 MIL/uL — ABNORMAL LOW (ref 3.87–5.11)
RDW: 14.7 % (ref 11.5–15.5)
WBC: 2.5 10*3/uL — ABNORMAL LOW (ref 4.0–10.5)
nRBC: 0 % (ref 0.0–0.2)

## 2021-07-13 LAB — BASIC METABOLIC PANEL
Anion gap: 8 (ref 5–15)
BUN: 10 mg/dL (ref 8–23)
CO2: 23 mmol/L (ref 22–32)
Calcium: 8.5 mg/dL — ABNORMAL LOW (ref 8.9–10.3)
Chloride: 111 mmol/L (ref 98–111)
Creatinine, Ser: 0.71 mg/dL (ref 0.44–1.00)
GFR, Estimated: >60 mL/min (ref >60)
Glucose, Bld: 130 mg/dL — ABNORMAL HIGH (ref 70–99)
Potassium: 4.3 mmol/L (ref 3.5–5.1)
Sodium: 142 mmol/L (ref 135–145)

## 2021-07-13 LAB — URINE CULTURE: Culture: <10000 — AB

## 2021-07-13 LAB — GLUCOSE, CAPILLARY: Glucose-Capillary: 131 mg/dL — ABNORMAL HIGH (ref 70–99)

## 2021-07-13 MED ORDER — MORPHINE SULFATE (PF) 2 MG/ML IV SOLN
2.0000 mg | INTRAVENOUS | Status: DC | PRN
Start: 2021-07-13 — End: 2021-07-14
  Administered 2021-07-13 – 2021-07-14 (×6): 2 mg via INTRAVENOUS
  Filled 2021-07-13 (×6): qty 1

## 2021-07-13 MED ORDER — KETOROLAC TROMETHAMINE 15 MG/ML IJ SOLN
15.0000 mg | Freq: Four times a day (QID) | INTRAMUSCULAR | Status: DC
  Administered 2021-07-13 – 2021-07-14 (×4): 15 mg via INTRAVENOUS
  Filled 2021-07-13 (×4): qty 1

## 2021-07-13 NOTE — TOC Initial Note (Signed)
Transition of Care [facility]) - Initial/Assessment Note    Patient Details  Name: Amber Barnes MRN: [id number] Date of Birth: 08/14/1950  [facility]) CM/SW Contact:    Beverly Sessions, RN Phone Number: 07/13/2021, 1:08 PM  Clinical Narrative:                 Admitted for: UVJ obstruction Admitted from: Home with husband PCP: [place]  [facility] [place]   Per MD no anticipated needs at discharge   Expected Discharge Plan: Home/Self Care Barriers to Discharge: Continued Medical Work up   Patient Goals and CMS Choice        Expected Discharge Plan and Services Expected Discharge Plan: Home/Self Care       Living arrangements for the past 2 months: [place]                                      Prior Living Arrangements/Services Living arrangements for the past 2 months: Single Family Home Lives with:: Spouse Patient language and need for interpreter reviewed:: Yes Do you feel safe going back to the place where you live?: Yes      Need for Family Participation in Patient Care: Yes (Comment) Care giver support system in place?: Yes (comment) Current home services: DME Criminal Activity/Legal Involvement Pertinent to Current Situation/Hospitalization: No - Comment as needed  Activities of Daily Living Home Assistive Devices/Equipment: Walker (specify type) ADL Screening (condition at time of admission) Patient's cognitive ability adequate to safely complete daily activities?: Yes Is the patient deaf or have difficulty hearing?: No Does the patient have difficulty seeing, even when wearing glasses/contacts?: No Does the patient have difficulty concentrating, remembering, or making decisions?: No Patient able to express need for assistance with ADLs?: Yes Does the patient have difficulty dressing or bathing?: No Independently performs ADLs?: Yes (appropriate for developmental age) Does the patient have difficulty walking or climbing  stairs?: No Weakness of Legs: Both Weakness of Arms/Hands: None  Permission Sought/Granted                  Emotional Assessment       Orientation: : Oriented to Self, Oriented to Place, Oriented to  Time, Oriented to Situation Alcohol / Substance Use: Not Applicable Psych Involvement: No (comment)  Admission diagnosis:  Dehydration [E86.0] Acute cystitis with hematuria [N30.01] Kidney stone on right side [N20.0] Ureterovesical junction (UVJ) obstruction [N13.5] Patient Active Problem List   Diagnosis Date Noted   Ureterovesical junction (UVJ) obstruction 07/12/2021   Acute pyelonephritis 07/12/2021   CKD (chronic kidney disease), stage II 07/12/2021   HLD (hyperlipidemia) 07/12/2021   Chronic diastolic CHF (congestive heart failure) ([place]) 07/12/2021   Hydroureteronephrosis 07/12/2021   Pyelonephritis 07/02/2021   Drug-induced neutropenia ([place]) 06/23/2021   CKD (chronic kidney disease) 06/21/2021   Chemotherapy induced diarrhea 06/09/2021   Anemia due to antineoplastic chemotherapy 05/18/2021   Chemotherapy induced nausea and vomiting 05/18/2021   COVID-19 04/30/2021   Neutropenia ([place])    Anxiety    Intractable vomiting    Diarrhea    Neutropenic fever ([place]) 04/05/2021   Encounter for antineoplastic chemotherapy 03/24/2021   Neuropathy 03/24/2021   Port-A-Cath in place 03/24/2021   HER2-positive carcinoma of breast ([place]) 01/26/2021   Goals of care, counseling/discussion 01/26/2021   Malignant neoplasm of upper-outer quadrant of right female breast ([place]) 01/19/2021   Left arm pain 12/02/2020   Soft  tissue mass 12/02/2020   Candidal intertrigo 09/09/2020   Urinary frequency 09/09/2020   Night sweat 03/31/2020   Nasal drainage 12/18/2019   Sinus congestion 12/18/2019   Nausea 10/30/2019   Major depression in remission ([place]) 08/27/2018   Primary osteoarthritis involving multiple joints 08/27/2018   Atrial fibrillation ([place]) 08/27/2018   COPD (chronic  obstructive pulmonary disease) ([place]) 08/27/2018   BMI 50.0-59.9, adult ([place]) 07/16/2018   Morbid obesity with BMI of 50.0-59.9, adult ([place]) 05/31/2018   Bradycardia 05/22/2017   Restless leg syndrome 04/13/2017   OSA (obstructive sleep apnea) 01/09/2017   A-fib ([place]) 11/28/2016   Atrial fibrillation with RVR ([place]) 07/27/2016   Fatty liver disease, nonalcoholic 35/01/5731   Family history of ovarian cancer 06/08/2016   GERD (gastroesophageal reflux disease) 05/05/2016   Hypertension 11/11/2014   Anxiety and depression 11/11/2014   Weight loss 11/11/2014   PCP:  [name], FNP Pharmacy:   [facility] [place], [place] [address] [place] [address] Phone: [phone] Fax: [phone]  [id number] [place], [place] - [place] [address] [place] [address] Phone: [phone] Fax: [phone]     Social Determinants of Health ([place]) Interventions    Readmission Risk Interventions Readmission Risk Prevention Plan 07/13/2021 04/09/2021  Transportation Screening Complete Complete  PCP or Specialist Appt within 3-5 Days - Complete  HRI or [place] - Complete  Social Work Consult for [place] Planning/Counseling - Complete  Palliative Care Screening - Not Applicable  Medication Review (RN Care Manager) Complete Complete  Palliative Care Screening Not Applicable -  [place] Not Applicable -  Some recent data might be hidden

## 2021-07-13 NOTE — Telephone Encounter (Signed)
Eliquis 5 mg refill request received. Patient is 71 years old, weight- 129.7 kg, Crea- 0.7 on 07/13/21, Diagnosis- PAF, and last seen by Dr. [name] on 04/14/21. Dose is appropriate based on dosing criteria. Will send in refill to requested pharmacy.

## 2021-07-13 NOTE — Progress Notes (Addendum)
[facility] [place] NAME: Amber Barnes    MR#:  [id number]  DATE OF BIRTH:  12-Sep-1950  SUBJECTIVE:   complains of bilateral abdominal pain. Trying to eat some breakfast earlier. No fever. No family at bedside REVIEW OF SYSTEMS:   Review of Systems  Constitutional:  Negative for chills, fever and weight loss.  HENT:  Negative for ear discharge, ear pain and nosebleeds.   Eyes:  Negative for blurred vision, pain and discharge.  Respiratory:  Negative for sputum production, shortness of breath, wheezing and stridor.   Cardiovascular:  Negative for chest pain, palpitations, orthopnea and PND.  Gastrointestinal:  Negative for abdominal pain, diarrhea, nausea and vomiting.  Genitourinary:  Positive for dysuria and flank pain. Negative for frequency and urgency.  Musculoskeletal:  Positive for back pain. Negative for joint pain.  Neurological:  Positive for weakness. Negative for sensory change, speech change and focal weakness.  Psychiatric/Behavioral:  Negative for depression and hallucinations. The patient is not nervous/anxious.   Tolerating Diet:yes Tolerating PT:   DRUG ALLERGIES:   Allergies  Allergen Reactions   Prednisone Other (See Comments)    Agitation, hyperactivity, polyphagia   Amiodarone Nausea And Vomiting   Hydrocodone-Acetaminophen Other (See Comments)    Hallucinations and combative   Oxycodone Itching    Can take it with benadryl   Tapentadol Itching   Tramadol Itching    Can take it with benadryl   Adhesive [Tape] Itching and Rash    Prefers paper tape   Latex Itching and Rash    use paper tap    VITALS:  Blood pressure (!) 157/76, pulse 72, temperature 97.7 F (36.5 C), resp. rate 18, height 5\' 5"  (1.651 m), weight 129.7 kg, SpO2 97 %.  PHYSICAL EXAMINATION:   Physical Exam  GENERAL:  71 y.o.-year-old patient lying in the bed with no acute distress.  HEENT: Head atraumatic, normocephalic.  Oropharynx and nasopharynx clear.  LUNGS: Normal breath sounds bilaterally, no wheezing, rales, rhonchi. No use of accessory muscles of respiration.  CARDIOVASCULAR: S1, S2 normal. No murmurs, rubs, or gallops.  ABDOMEN: Soft, flank tender, nondistended. Bowel sounds present. No organomegaly or mass.  EXTREMITIES: No cyanosis, clubbing or edema b/l.    NEUROLOGIC: nonfocal PSYCHIATRIC:  patient is alert and oriented x 3.  SKIN: No obvious rash, lesion, or ulcer.   LABORATORY PANEL:  CBC Recent Labs  Lab 07/13/21 0412  WBC 2.5*  HGB 9.4*  HCT 28.9*  PLT 222    Chemistries  Recent Labs  Lab 07/12/21 0309 07/13/21 0412  NA 141 142  K 4.2 4.3  CL 109 111  CO2 23 23  GLUCOSE 135* 130*  BUN 12 10  CREATININE 1.02* 0.71  CALCIUM 8.8* 8.5*  AST 29  --   ALT 20  --   ALKPHOS 91  --   BILITOT 0.9  --    Cardiac Enzymes No results for input(s): TROPONINI in the last 168 hours. RADIOLOGY:  CT ABDOMEN PELVIS W CONTRAST  Result Date: 07/12/2021 CLINICAL DATA:  Flank pain with kidney stone suspected EXAM: CT ABDOMEN AND PELVIS WITH CONTRAST TECHNIQUE: Multidetector CT imaging of the abdomen and pelvis was performed using the standard protocol following bolus administration of intravenous contrast. CONTRAST:  13mL OMNIPAQUE IOHEXOL 300 MG/ML  SOLN COMPARISON:  07/02/2021 FINDINGS: Lower chest:  No acute finding. Hepatobiliary: No focal liver abnormality.No evidence of biliary obstruction or stone. Pancreas: Generalized atrophy Spleen: Unremarkable. Adrenals/Urinary Tract: Negative  adrenals. Right hydroureteronephrosis with perinephric stranding presumably to a 3 mm right UVJ calculus suggested on coronal reformats, but the pelvis was obscured by artifact from a right hip prosthesis. Layering calcification in the left renal pelvis and present in the bilateral calices which are unchanged. Left perinephric stranding is improved. Low densities in the left renal cortex are unchanged. Collapsed  bladder which is largely obscured by streak artifact. Stomach/Bowel:  No obstruction. Appendectomy. Vascular/Lymphatic: No acute vascular abnormality. Atheromatous calcification. No mass or adenopathy. Reproductive:Hysterectomy Other: No ascites or pneumoperitoneum. Musculoskeletal: No acute abnormalities. Generalized lumbar spine degeneration. Total right hip arthroplasty. IMPRESSION: 1. Right hydroureteronephrosis likely due to a 3 mm UVJ calculus, although certainty is limited by extensive artifact from a right hip prosthesis. 2. Bilateral nephrolithiasis. Electronically Signed   By: Jorje Guild M.D.   On: 07/12/2021 05:26   DG OR UROLOGY CYSTO IMAGE ([place])  Result Date: 07/12/2021 There is no interpretation for this exam.  This order is for images obtained during a surgical procedure.  Please See "Surgeries" Tab for more information regarding the procedure.   ASSESSMENT AND PLAN:  Amber Barnes is a 71 y.o. female with medical history significant of hypertension, hyperlipidemia, COPD, GERD, depression with anxiety, OSA not on CPAP, dCHF, RLS, PUD, mitral valve regurgitation, breast cancer (s/p of mastectomy, on chemo therapy), atrial fibrillation on Eliquis, CKD-II, who presents with abdominal pain.   Patient was recently hospitalized from 12/23-12/28 due to acute left  pyelonephritis.  She just finished a course of ciprofloxacin treatment.   Acute pyelonephritis and ureterovesical junction (UVJ) obstruction and right hydroureteronephrosis --Dr. Diamantina Providence of urology is consulted, did urgent Bilateral ureteral stent placement.  -- Patient came in with tachycardia, but no tachypnea, no fever or leukocytosis.  Does not meet criteria for sepsis  --IV Rocephin --blood culture  negative -- urine culture pending -- all of urology as outpatient  Hypertension -IV hydralazine as needed -Zebeta, Cardizem   Atrial fibrillation (HCC) -Cardizem -Continue Eliquis    Diarrhea with cipro for 10  days recently -f/u C. difficile test if pt is able to give stool sample   CKD (chronic kidney disease), stage II: Stable, creatinine 1.02, BUN 12    Chronic diastolic CHF (congestive heart failure) ([place]): 2D echo on 03/08/2021 showed EF of 60 to 65% with grade 1 diastolic dysfunction.  She has trace leg edema, no JVD.  Does not seem to have CHF exacerbation.  BNP 562 -Continue home Lasix         Procedures: cystoscopy with bilateral ureteral stent placement Family communication : none Consults : urology CODE STATUS: full DVT Prophylaxis : eliquis Level of care: Telemetry Medical Status is: Inpatient  Remains inpatient appropriate because: Pyelopnepritis         TOTAL TIME TAKING CARE OF THIS PATIENT: 25 minutes.  >50% time spent on counselling and coordination of care  Note: This dictation was prepared with Dragon dictation along with smaller phrase technology. Any transcriptional errors that result from this process are unintentional.  Amber Barnes M.D    [facility]   CC: Primary care physician; Amber Hawthorne, FNP Patient ID: Amber Barnes, female   DOB: [birth year], 71 y.o.   MRN: [id number]

## 2021-07-13 NOTE — Telephone Encounter (Signed)
Refill request

## 2021-07-14 DIAGNOSIS — N135 Crossing vessel and stricture of ureter without hydronephrosis: Secondary | ICD-10-CM | POA: Diagnosis not present

## 2021-07-14 LAB — GLUCOSE, CAPILLARY: Glucose-Capillary: 119 mg/dL — ABNORMAL HIGH (ref 70–99)

## 2021-07-14 MED ORDER — OXYCODONE HCL 5 MG PO TABS
5.0000 mg | ORAL_TABLET | ORAL | Status: DC | PRN
  Administered 2021-07-14 – 2021-07-17 (×10): 10 mg via ORAL
  Filled 2021-07-14 (×10): qty 2

## 2021-07-14 MED ORDER — MORPHINE SULFATE (PF) 2 MG/ML IV SOLN
2.0000 mg | Freq: Four times a day (QID) | INTRAVENOUS | Status: DC | PRN
Start: 2021-07-14 — End: 2021-07-17
  Administered 2021-07-14 – 2021-07-16 (×9): 2 mg via INTRAVENOUS
  Filled 2021-07-14 (×12): qty 1

## 2021-07-14 MED ORDER — SODIUM CHLORIDE 0.9 % IV SOLN: INTRAVENOUS | Status: AC

## 2021-07-14 MED ORDER — ENOXAPARIN SODIUM 60 MG/0.6ML IJ SOSY
60.0000 mg | PREFILLED_SYRINGE | INTRAMUSCULAR | Status: DC
  Administered 2021-07-15 – 2021-07-17 (×3): 60 mg via SUBCUTANEOUS
  Filled 2021-07-14 (×3): qty 0.6

## 2021-07-14 NOTE — Progress Notes (Signed)
PROGRESS NOTE    Amber Barnes  DPO:[id number] DOB: 10-18-1950 DOA: 07/12/2021 PCP: [name], FNP  [id number]   Assessment & Plan:   Principal Problem:   Ureterovesical junction (UVJ) obstruction Active Problems:   Hypertension   Anxiety and depression   Atrial fibrillation (HCC)   Diarrhea   Acute pyelonephritis   CKD (chronic kidney disease), stage II   HLD (hyperlipidemia)   Chronic diastolic CHF (congestive heart failure) (HCC)   Hydroureteronephrosis   Amber Barnes is a 71 y.o. female with medical history significant of hypertension, hyperlipidemia, COPD, GERD, depression with anxiety, OSA not on CPAP, dCHF, RLS, PUD, mitral valve regurgitation, breast cancer (s/p of mastectomy, on chemo therapy), atrial fibrillation on Eliquis, CKD-II, who presents with abdominal pain.   Patient was recently hospitalized from 12/23-12/28 due to acute left  pyelonephritis.  She just finished a course of ciprofloxacin treatment.    Acute pyelonephritis and ureterovesical junction (UVJ) obstruction and right hydroureteronephrosis --Dr. Diamantina Providence of urology is consulted, did urgent Bilateral ureteral stent placement.   --started on IV Rocephin --urine cx insignificant growth, likely due to pt having been on abx recently Plan: --cont ceftriaxone for now  Hematuria --in the setting of pyelo, urological procedure and taking Eliquis --hold home Eliquis for now  Hypertension -IV hydralazine as needed -Zebeta --hold home cardizem due to bradycardia   Atrial fibrillation ([place]) --hold home cardizem due to bradycardia --cont home bisoprolol --hold Eliquis due to hematuria  Diarrhea with cipro for 10 days recently -f/u C. difficile test if pt is able to give stool sample   CKD (chronic kidney disease), stage II: Stable, creatinine 1.02, BUN 12    Chronic diastolic CHF (congestive heart failure) ([place]): 2D echo on 03/08/2021 showed EF of 60 to 65% with grade 1 diastolic  dysfunction.  She has trace leg edema, no JVD.  Does not seem to have CHF exacerbation.  BNP 562 -Continue home Lasix         breast cancer (s/p of mastectomy, on chemo therapy) --chemo on hold due to active infection --outpatient followup with oncology   DVT prophylaxis: Lovenox SQ Code Status: Full code  Family Communication:  Level of care: Telemetry Medical Dispo:   The patient is from: home Anticipated d/c is to: home Anticipated d/c date is: 1-2 days Patient currently is not medically ready to d/c due to: IV abx, hematuria, dropping Hgb   Subjective and Interval History:  Pain better controlled today.  Still having dysuria sometimes.   Objective: Vitals:   07/14/21 0443 07/14/21 0758 07/14/21 1533 07/14/21 2031  BP:  140/67 123/65 (!) 154/77  Pulse:  (!) 58 (!) 51 (!) 52  Resp:  18 18 16   Temp:  (!) 97.5 F (36.4 C) 98 F (36.7 C) 98.3 F (36.8 C)  TempSrc:  Oral  Oral  SpO2:  98% 98% 97%  Weight: 121.6 kg     Height:        Intake/Output Summary (Last 24 hours) at 07/14/2021 2319 Last data filed at 07/14/2021 1700 Gross per 24 hour  Intake 497.4 ml  Output 1100 ml  Net -602.6 ml   Filed Weights   07/12/21 0847 07/13/21 0500 07/14/21 0443  Weight: 119.6 kg 129.7 kg 121.6 kg    Examination:   Constitutional: NAD, AAOx3 HEENT: conjunctivae and lids normal, EOMI CV: No cyanosis.   RESP: normal respiratory effort, on RA Neuro: II - XII grossly intact.   Psych: Normal mood and affect.  Appropriate judgement  and reason   Data Reviewed: I have personally reviewed following labs and imaging studies  CBC: Recent Labs  Lab 07/12/21 0309 07/13/21 0412  WBC 7.1 2.5*  HGB 11.5* 9.4*  HCT 35.2* 28.9*  MCV 94.6 93.2  PLT 257 854   Basic Metabolic Panel: Recent Labs  Lab 07/12/21 0309 07/13/21 0412  NA 141 142  K 4.2 4.3  CL 109 111  CO2 23 23  GLUCOSE 135* 130*  BUN 12 10  CREATININE 1.02* 0.71  CALCIUM 8.8* 8.5*   GFR: Estimated Creatinine  Clearance: 88 mL/min (by C-G formula based on SCr of 0.71 mg/dL). Liver Function Tests: Recent Labs  Lab 07/12/21 0309  AST 29  ALT 20  ALKPHOS 91  BILITOT 0.9  PROT 7.1  ALBUMIN 3.7   Recent Labs  Lab 07/12/21 0309  LIPASE 22   No results for input(s): AMMONIA in the last 168 hours. Coagulation Profile: No results for input(s): INR, PROTIME in the last 168 hours. Cardiac Enzymes: No results for input(s): CKTOTAL, CKMB, CKMBINDEX, TROPONINI in the last 168 hours. BNP (last 3 results) No results for input(s): PROBNP in the last 8760 hours. HbA1C: No results for input(s): HGBA1C in the last 72 hours. CBG: Recent Labs  Lab 07/13/21 0816 07/14/21 0837  GLUCAP 131* 119*   Lipid Profile: No results for input(s): CHOL, HDL, LDLCALC, TRIG, CHOLHDL, LDLDIRECT in the last 72 hours. Thyroid Function Tests: No results for input(s): TSH, T4TOTAL, FREET4, T3FREE, THYROIDAB in the last 72 hours. Anemia Panel: No results for input(s): VITAMINB12, FOLATE, FERRITIN, TIBC, IRON, RETICCTPCT in the last 72 hours. Sepsis Labs: No results for input(s): PROCALCITON, LATICACIDVEN in the last 168 hours.  Recent Results (from the past 240 hour(s))  Urine Culture     Status: Abnormal   Collection Time: 07/12/21  8:48 AM   Specimen: Urine, Random  Result Value Ref Range Status   Specimen Description   Final    URINE, RANDOM Performed at [facility], [address] [place], [place] [address]    Special Requests   Final    NONE Performed at [facility], [address] [place], [place] [address]    Culture (A)  Final    <10,000 COLONIES/mL INSIGNIFICANT GROWTH Performed at [facility] [address], [place], [place] [address]    Report Status 07/13/2021 FINAL  Final  Resp Panel by RT-PCR (Flu A&B, Covid) Nasopharyngeal Swab     Status: None   Collection Time: 07/12/21  9:45 AM   Specimen: Nasopharyngeal Swab; Nasopharyngeal(NP) swabs in vial transport  medium  Result Value Ref Range Status   SARS Coronavirus 2 by RT PCR NEGATIVE NEGATIVE Final    Comment: (NOTE) SARS-CoV-2 target nucleic acids are NOT DETECTED.  The SARS-CoV-2 RNA is generally detectable in upper respiratory specimens during the acute phase of infection. The lowest concentration of SARS-CoV-2 viral copies this assay can detect is 138 copies/mL. A negative result does not preclude SARS-Cov-2 infection and should not be used as the sole basis for treatment or other patient management decisions. A negative result may occur with  improper specimen collection/handling, submission of specimen other than nasopharyngeal swab, presence of viral mutation(s) within the areas targeted by this assay, and inadequate number of viral copies(<138 copies/mL). A negative result must be combined with clinical observations, patient history, and epidemiological information. The expected result is Negative.  Fact Sheet for Patients:  EntrepreneurPulse.com.au  Fact Sheet for Healthcare Providers:  IncredibleEmployment.be  This test is no t yet approved or cleared by the [place] and  has been authorized for detection and/or diagnosis of SARS-CoV-2 by FDA under an Emergency Use Authorization (EUA). This EUA will remain  in effect (meaning this test can be used) for the duration of the COVID-19 declaration under Section 564(b)(1) of the Act, 21 U.S.C.section 360bbb-3(b)(1), unless the authorization is terminated  or revoked sooner.       Influenza A by PCR NEGATIVE NEGATIVE Final   Influenza B by PCR NEGATIVE NEGATIVE Final    Comment: (NOTE) The Xpert Xpress SARS-CoV-2/FLU/RSV plus assay is intended as an aid in the diagnosis of influenza from Nasopharyngeal swab specimens and should not be used as a sole basis for treatment. Nasal washings and aspirates are unacceptable for Xpert Xpress SARS-CoV-2/FLU/RSV testing.  Fact Sheet for  Patients: EntrepreneurPulse.com.au  Fact Sheet for Healthcare Providers: IncredibleEmployment.be  This test is not yet approved or cleared by the [place] FDA and has been authorized for detection and/or diagnosis of SARS-CoV-2 by FDA under an Emergency Use Authorization (EUA). This EUA will remain in effect (meaning this test can be used) for the duration of the COVID-19 declaration under Section 564(b)(1) of the Act, 21 U.S.C. section 360bbb-3(b)(1), unless the authorization is terminated or revoked.  Performed at [facility], [address] [place], [place] [address]   Culture, blood (Routine X 2) w Reflex to ID Panel     Status: None (Preliminary result)   Collection Time: 07/12/21 11:57 PM   Specimen: BLOOD  Result Value Ref Range Status   Specimen Description BLOOD LEFT WRIST  Final   Special Requests   Final    IN PEDIATRIC BOTTLE Blood Culture results may not be optimal due to an excessive volume of blood received in culture bottles   Culture   Final    NO GROWTH 1 DAY Performed at [facility], [address], [place], [place] [address]    Report Status PENDING  Incomplete  Culture, blood (Routine X 2) w Reflex to ID Panel     Status: None (Preliminary result)   Collection Time: 07/12/21 11:57 PM   Specimen: BLOOD  Result Value Ref Range Status   Specimen Description BLOOD LEFT ASSIST CONTROL  Final   Special Requests   Final    BOTTLES DRAWN AEROBIC AND ANAEROBIC Blood Culture results may not be optimal due to an excessive volume of blood received in culture bottles   Culture   Final    NO GROWTH 1 DAY Performed at [facility], [address], [place], [place] [address]    Report Status PENDING  Incomplete      Radiology Studies: No results found.   Scheduled Meds:  apixaban  5 mg Oral BID   bisoprolol  5 mg Oral BID   cholecalciferol  5,000 Units Oral Daily   diltiazem  240 mg Oral  Daily   DULoxetine  30 mg Oral BID   furosemide  20 mg Oral Daily   multivitamin with minerals  1 tablet Oral Daily   ranolazine  500 mg Oral BID   rOPINIRole  2 mg Oral QHS   rosuvastatin  20 mg Oral Daily   Continuous Infusions:  cefTRIAXone (ROCEPHIN)  IV Stopped (07/14/21 1733)     LOS: 2 days     Enzo Bi, MD [facility] If 7PM-7AM, please contact night-coverage 07/14/2021, 11:19 PM

## 2021-07-15 ENCOUNTER — Other Ambulatory Visit: Payer: Self-pay | Admitting: Urology

## 2021-07-15 DIAGNOSIS — N135 Crossing vessel and stricture of ureter without hydronephrosis: Secondary | ICD-10-CM | POA: Diagnosis not present

## 2021-07-15 DIAGNOSIS — N200 Calculus of kidney: Secondary | ICD-10-CM

## 2021-07-15 DIAGNOSIS — N201 Calculus of ureter: Secondary | ICD-10-CM

## 2021-07-15 LAB — BASIC METABOLIC PANEL
Anion gap: 7 (ref 5–15)
BUN: 21 mg/dL (ref 8–23)
CO2: 28 mmol/L (ref 22–32)
Calcium: 8.2 mg/dL — ABNORMAL LOW (ref 8.9–10.3)
Chloride: 104 mmol/L (ref 98–111)
Creatinine, Ser: 0.92 mg/dL (ref 0.44–1.00)
GFR, Estimated: >60 mL/min (ref >60)
Glucose, Bld: 96 mg/dL (ref 70–99)
Potassium: 4.2 mmol/L (ref 3.5–5.1)
Sodium: 139 mmol/L (ref 135–145)

## 2021-07-15 LAB — CBC
HCT: 27.2 % — ABNORMAL LOW (ref 36.0–46.0)
Hemoglobin: 8.8 g/dL — ABNORMAL LOW (ref 12.0–15.0)
MCH: 30 pg (ref 26.0–34.0)
MCHC: 32.4 g/dL (ref 30.0–36.0)
MCV: 92.8 fL (ref 80.0–100.0)
Platelets: 208 10*3/uL (ref 150–400)
RBC: 2.93 MIL/uL — ABNORMAL LOW (ref 3.87–5.11)
RDW: 14.4 % (ref 11.5–15.5)
WBC: 4.7 10*3/uL (ref 4.0–10.5)
nRBC: 0 % (ref 0.0–0.2)

## 2021-07-15 LAB — GLUCOSE, CAPILLARY: Glucose-Capillary: 87 mg/dL (ref 70–99)

## 2021-07-15 LAB — MAGNESIUM: Magnesium: 2.3 mg/dL (ref 1.7–2.4)

## 2021-07-15 NOTE — Care Management Important Message (Signed)
Important Message  Patient Details  Name: Amber Barnes MRN: [id number] Date of Birth: July 02, 1951   Medicare Important Message Given:  Yes     Dannette Barbara 07/15/2021, 1:59 PM

## 2021-07-15 NOTE — Progress Notes (Signed)
Surgical Physician Order Form [facility] [place]  * Scheduling expectation : 2-4 weeks from 07/12/2021  *Length of Case: 1 hour  *Clearance needed: no  *Anticoagulation Instructions: May continue all anticoagulants  *Aspirin Instructions: Ok to continue all  *Post-op visit Date/Instructions:   TBD  *Diagnosis: Right distal ureteral stone, left renal stone  *Procedure: bilateral  Ureteroscopy w/laser lithotripsy & stent exchange (93406)   Additional orders: N/A  -Admit type: OUTpatient  -Anesthesia: General  -VTE Prophylaxis Standing Order SCDs       Other:   -Standing Lab Orders Per Anesthesia    Lab other: None  -Standing Test orders EKG/Chest x-ray per Anesthesia       Test other:   - Medications:  Ancef 2gm IV  -Other orders:  N/A

## 2021-07-15 NOTE — Progress Notes (Signed)
PROGRESS NOTE    Amber Barnes  PXT:[id number] DOB: 1951-02-03 DOA: 07/12/2021 PCP: [name], FNP  205A/205A-AA   Assessment & Plan:   Principal Problem:   Ureterovesical junction (UVJ) obstruction Active Problems:   Hypertension   Anxiety and depression   Atrial fibrillation (HCC)   Diarrhea   Acute pyelonephritis   CKD (chronic kidney disease), stage II   HLD (hyperlipidemia)   Chronic diastolic CHF (congestive heart failure) (HCC)   Hydroureteronephrosis   Amber Barnes is a 71 y.o. female with medical history significant of hypertension, hyperlipidemia, COPD, GERD, depression with anxiety, OSA not on CPAP, dCHF, RLS, PUD, mitral valve regurgitation, breast cancer (s/p of mastectomy, on chemo therapy), atrial fibrillation on Eliquis, CKD-II, who presents with abdominal pain.   Patient was recently hospitalized from 12/23-12/28 due to acute left  pyelonephritis.  She just finished a course of ciprofloxacin treatment.    Acute pyelonephritis and ureterovesical junction (UVJ) obstruction and right hydroureteronephrosis --Dr. Diamantina Providence of urology is consulted, did urgent Bilateral ureteral stent placement.   --started on IV Rocephin --urine cx insignificant growth, likely due to pt having been on abx recently Plan: --cont ceftriaxone while inpatient  Hematuria --in the setting of pyelo, urological procedure and taking Eliquis --cont to hold home Eliquis  Acute blood loss anemia --2/2 hematuria --monitor Hgb --transfuse to keep Hgb >7  Hypertension -IV hydralazine as needed -Zebeta --hold home cardizem due to bradycardia --start hydralazine 50 mg q8h   Atrial fibrillation (HCC) --hold home cardizem due to bradycardia --cont home bisoprolol --hold Eliquis due to hematuria  Diarrhea with cipro for 10 days recently -f/u C. difficile test if pt is able to give stool sample   CKD (chronic kidney disease), stage II: Stable, creatinine 1.02, BUN 12     Chronic diastolic CHF (congestive heart failure) ([place]): 2D echo on 03/08/2021 showed EF of 60 to 65% with grade 1 diastolic dysfunction.  She has trace leg edema, no JVD.  Does not seem to have CHF exacerbation.  BNP 562 -Continue home Lasix         breast cancer (s/p of mastectomy, on chemo therapy) --chemo on hold due to active infection --outpatient followup with oncology   DVT prophylaxis: Lovenox SQ Code Status: Full code  Family Communication:  Level of care: Telemetry Medical Dispo:   The patient is from: home Anticipated d/c is to: home Anticipated d/c date is: 1-2 days Patient currently is not medically ready to d/c due to: hematuria, dropping Hgb   Subjective and Interval History:  Pt reported pain better controlled with current pain regimen.  Still having gross hematuria.   Objective: Vitals:   07/15/21 0425 07/15/21 0500 07/15/21 0738 07/15/21 1524  BP:  (!) 154/82 (!) 151/79 (!) 148/74  Pulse:  (!) 49 (!) 51 (!) 49  Resp:  16 18 18   Temp:  97.6 F (36.4 C) 97.7 F (36.5 C) (!) 97.5 F (36.4 C)  TempSrc:  Oral Oral Oral  SpO2:  99% 100% 95%  Weight: 130.8 kg     Height:        Intake/Output Summary (Last 24 hours) at 07/15/2021 1750 Last data filed at 07/15/2021 1320 Gross per 24 hour  Intake 100 ml  Output 1300 ml  Net -1200 ml   Filed Weights   07/13/21 0500 07/14/21 0443 07/15/21 0425  Weight: 129.7 kg 121.6 kg 130.8 kg    Examination:   Constitutional: NAD, AAOx3 HEENT: conjunctivae and lids normal, EOMI CV: No cyanosis.  RESP: normal respiratory effort, on RA Neuro: II - XII grossly intact.   Psych: Normal mood and affect.  Appropriate judgement and reason   Data Reviewed: I have personally reviewed following labs and imaging studies  CBC: Recent Labs  Lab 07/12/21 0309 07/13/21 0412 07/15/21 0413  WBC 7.1 2.5* 4.7  HGB 11.5* 9.4* 8.8*  HCT 35.2* 28.9* 27.2*  MCV 94.6 93.2 92.8  PLT 257 222 885   Basic Metabolic Panel: Recent  Labs  Lab 07/12/21 0309 07/13/21 0412 07/15/21 0413  NA 141 142 139  K 4.2 4.3 4.2  CL 109 111 104  CO2 23 23 28   GLUCOSE 135* 130* 96  BUN 12 10 21   CREATININE 1.02* 0.71 0.92  CALCIUM 8.8* 8.5* 8.2*  MG  --   --  2.3   GFR: Estimated Creatinine Clearance: 79.9 mL/min (by C-G formula based on SCr of 0.92 mg/dL). Liver Function Tests: Recent Labs  Lab 07/12/21 0309  AST 29  ALT 20  ALKPHOS 91  BILITOT 0.9  PROT 7.1  ALBUMIN 3.7   Recent Labs  Lab 07/12/21 0309  LIPASE 22   No results for input(s): AMMONIA in the last 168 hours. Coagulation Profile: No results for input(s): INR, PROTIME in the last 168 hours. Cardiac Enzymes: No results for input(s): CKTOTAL, CKMB, CKMBINDEX, TROPONINI in the last 168 hours. BNP (last 3 results) No results for input(s): PROBNP in the last 8760 hours. HbA1C: No results for input(s): HGBA1C in the last 72 hours. CBG: Recent Labs  Lab 07/13/21 0816 07/14/21 0837 07/15/21 0739  GLUCAP 131* 119* 87   Lipid Profile: No results for input(s): CHOL, HDL, LDLCALC, TRIG, CHOLHDL, LDLDIRECT in the last 72 hours. Thyroid Function Tests: No results for input(s): TSH, T4TOTAL, FREET4, T3FREE, THYROIDAB in the last 72 hours. Anemia Panel: No results for input(s): VITAMINB12, FOLATE, FERRITIN, TIBC, IRON, RETICCTPCT in the last 72 hours. Sepsis Labs: No results for input(s): PROCALCITON, LATICACIDVEN in the last 168 hours.  Recent Results (from the past 240 hour(s))  Urine Culture     Status: Abnormal   Collection Time: 07/12/21  8:48 AM   Specimen: Urine, Random  Result Value Ref Range Status   Specimen Description   Final    URINE, RANDOM Performed at [facility], [address], [place], [place] [address]    Special Requests   Final    NONE Performed at [facility], [address] [place], [place] [address]    Culture (A)  Final    <10,000 COLONIES/mL INSIGNIFICANT GROWTH Performed at [facility] [address] [place], [place] [address]    Report Status 07/13/2021 FINAL  Final  Resp Panel by RT-PCR (Flu A&B, Covid) Nasopharyngeal Swab     Status: None   Collection Time: 07/12/21  9:45 AM   Specimen: Nasopharyngeal Swab; Nasopharyngeal(NP) swabs in vial transport medium  Result Value Ref Range Status   SARS Coronavirus 2 by RT PCR NEGATIVE NEGATIVE Final    Comment: (NOTE) SARS-CoV-2 target nucleic acids are NOT DETECTED.  The SARS-CoV-2 RNA is generally detectable in upper respiratory specimens during the acute phase of infection. The lowest concentration of SARS-CoV-2 viral copies this assay can detect is 138 copies/mL. A negative result does not preclude SARS-Cov-2 infection and should not be used as the sole basis for treatment or other patient management decisions. A negative result may occur with  improper specimen collection/handling, submission of specimen other than nasopharyngeal swab, presence of  viral mutation(s) within the areas targeted by this assay, and inadequate number of viral copies(<138 copies/mL). A negative result must be combined with clinical observations, patient history, and epidemiological information. The expected result is Negative.  Fact Sheet for Patients:  EntrepreneurPulse.com.au  Fact Sheet for Healthcare Providers:  IncredibleEmployment.be  This test is no t yet approved or cleared by the [place] FDA and  has been authorized for detection and/or diagnosis of SARS-CoV-2 by FDA under an Emergency Use Authorization (EUA). This EUA will remain  in effect (meaning this test can be used) for the duration of the COVID-19 declaration under Section 564(b)(1) of the Act, 21 U.S.C.section 360bbb-3(b)(1), unless the authorization is terminated  or revoked sooner.       Influenza A by PCR NEGATIVE NEGATIVE Final   Influenza B by PCR NEGATIVE NEGATIVE Final    Comment: (NOTE) The Xpert Xpress  SARS-CoV-2/FLU/RSV plus assay is intended as an aid in the diagnosis of influenza from Nasopharyngeal swab specimens and should not be used as a sole basis for treatment. Nasal washings and aspirates are unacceptable for Xpert Xpress SARS-CoV-2/FLU/RSV testing.  Fact Sheet for Patients: EntrepreneurPulse.com.au  Fact Sheet for Healthcare Providers: IncredibleEmployment.be  This test is not yet approved or cleared by the [place] FDA and has been authorized for detection and/or diagnosis of SARS-CoV-2 by FDA under an Emergency Use Authorization (EUA). This EUA will remain in effect (meaning this test can be used) for the duration of the COVID-19 declaration under Section 564(b)(1) of the Act, 21 U.S.C. section 360bbb-3(b)(1), unless the authorization is terminated or revoked.  Performed at [facility], [address] [place], [place] [address]   Culture, blood (Routine X 2) w Reflex to ID Panel     Status: None (Preliminary result)   Collection Time: 07/12/21 11:57 PM   Specimen: BLOOD  Result Value Ref Range Status   Specimen Description BLOOD LEFT WRIST  Final   Special Requests   Final    IN PEDIATRIC BOTTLE Blood Culture results may not be optimal due to an excessive volume of blood received in culture bottles   Culture   Final    NO GROWTH 2 DAYS Performed at [facility], [address] [place], [place] [address]    Report Status PENDING  Incomplete  Culture, blood (Routine X 2) w Reflex to ID Panel     Status: None (Preliminary result)   Collection Time: 07/12/21 11:57 PM   Specimen: BLOOD  Result Value Ref Range Status   Specimen Description BLOOD LEFT ASSIST CONTROL  Final   Special Requests   Final    BOTTLES DRAWN AEROBIC AND ANAEROBIC Blood Culture results may not be optimal due to an excessive volume of blood received in culture bottles   Culture   Final    NO GROWTH 2 DAYS Performed at [facility], [address], [place], [place] [address]    Report Status PENDING  Incomplete      Radiology Studies: No results found.   Scheduled Meds:  bisoprolol  5 mg Oral BID   cholecalciferol  5,000 Units Oral Daily   DULoxetine  30 mg Oral BID   enoxaparin (LOVENOX) injection  60 mg Subcutaneous Q24H   furosemide  20 mg Oral Daily   multivitamin with minerals  1 tablet Oral Daily   ranolazine  500 mg Oral BID   rOPINIRole  2 mg Oral QHS   rosuvastatin  20 mg Oral Daily  Continuous Infusions:  cefTRIAXone (ROCEPHIN)  IV Stopped (07/15/21 1041)     LOS: 3 days     Enzo Bi, MD [facility] If 7PM-7AM, please contact night-coverage 07/15/2021, 5:50 PM

## 2021-07-16 ENCOUNTER — Other Ambulatory Visit: Payer: Self-pay | Admitting: Family

## 2021-07-16 DIAGNOSIS — N135 Crossing vessel and stricture of ureter without hydronephrosis: Secondary | ICD-10-CM | POA: Diagnosis not present

## 2021-07-16 DIAGNOSIS — K760 Fatty (change of) liver, not elsewhere classified: Secondary | ICD-10-CM

## 2021-07-16 LAB — BASIC METABOLIC PANEL
Anion gap: 4 — ABNORMAL LOW (ref 5–15)
BUN: 15 mg/dL (ref 8–23)
CO2: 29 mmol/L (ref 22–32)
Calcium: 8 mg/dL — ABNORMAL LOW (ref 8.9–10.3)
Chloride: 104 mmol/L (ref 98–111)
Creatinine, Ser: 0.68 mg/dL (ref 0.44–1.00)
GFR, Estimated: >60 mL/min (ref >60)
Glucose, Bld: 95 mg/dL (ref 70–99)
Potassium: 3.6 mmol/L (ref 3.5–5.1)
Sodium: 137 mmol/L (ref 135–145)

## 2021-07-16 LAB — CBC
HCT: 27.9 % — ABNORMAL LOW (ref 36.0–46.0)
Hemoglobin: 8.9 g/dL — ABNORMAL LOW (ref 12.0–15.0)
MCH: 29.8 pg (ref 26.0–34.0)
MCHC: 31.9 g/dL (ref 30.0–36.0)
MCV: 93.3 fL (ref 80.0–100.0)
Platelets: 198 10*3/uL (ref 150–400)
RBC: 2.99 MIL/uL — ABNORMAL LOW (ref 3.87–5.11)
RDW: 14.5 % (ref 11.5–15.5)
WBC: 4.7 10*3/uL (ref 4.0–10.5)
nRBC: 0 % (ref 0.0–0.2)

## 2021-07-16 LAB — GLUCOSE, CAPILLARY
Glucose-Capillary: 64 mg/dL — ABNORMAL LOW (ref 70–99)
Glucose-Capillary: 86 mg/dL (ref 70–99)

## 2021-07-16 LAB — MAGNESIUM: Magnesium: 2.2 mg/dL (ref 1.7–2.4)

## 2021-07-16 MED ORDER — HYDRALAZINE HCL 50 MG PO TABS
50.0000 mg | ORAL_TABLET | Freq: Three times a day (TID) | ORAL | Status: DC
  Administered 2021-07-16 – 2021-07-17 (×4): 50 mg via ORAL
  Filled 2021-07-16 (×4): qty 1

## 2021-07-16 NOTE — Plan of Care (Signed)

## 2021-07-16 NOTE — Progress Notes (Signed)
PROGRESS NOTE    Amber Barnes  XBM:[id number] DOB: 07-19-1950 DOA: 07/12/2021 PCP: [name], FNP  205A/205A-AA   Assessment & Plan:   Principal Problem:   Ureterovesical junction (UVJ) obstruction Active Problems:   Hypertension   Anxiety and depression   Atrial fibrillation (HCC)   Diarrhea   Acute pyelonephritis   CKD (chronic kidney disease), stage II   HLD (hyperlipidemia)   Chronic diastolic CHF (congestive heart failure) (HCC)   Hydroureteronephrosis   Amber Barnes is a 71 y.o. female with medical history significant of hypertension, hyperlipidemia, COPD, GERD, depression with anxiety, OSA not on CPAP, dCHF, RLS, PUD, mitral valve regurgitation, breast cancer (s/p of mastectomy, on chemo therapy), atrial fibrillation on Eliquis, CKD-II, who presents with abdominal pain.   Patient was recently hospitalized from 12/23-12/28 due to acute left  pyelonephritis.  She just finished a course of ciprofloxacin treatment.    Acute pyelonephritis and ureterovesical junction (UVJ) obstruction and right hydroureteronephrosis --Dr. Diamantina Providence of urology is consulted, did urgent Bilateral ureteral stent placement.   --started on IV Rocephin --urine cx insignificant growth, likely due to pt having been on abx recently Plan: --cont ceftriaxone while inpatient  Hematuria --in the setting of pyelo, urological procedure and taking Eliquis --cont to hold home Eliquis  Acute blood loss anemia --2/2 hematuria --monitor Hgb --transfuse to keep Hgb >7  Hypertension --hold home bisoprolol and cardizem due to bradycardia --cont hydralazine 50 mg q8h (new)   Atrial fibrillation (HCC) --hold home bisoprolol and cardizem due to bradycardia --hold Eliquis due to hematuria  Diarrhea with cipro for 10 days recently -f/u C. difficile test if pt is able to give stool sample   CKD (chronic kidney disease), stage II: Stable, creatinine 1.02, BUN 12    Chronic diastolic CHF  (congestive heart failure) ([place]): 2D echo on 03/08/2021 showed EF of 60 to 65% with grade 1 diastolic dysfunction.  She has trace leg edema, no JVD.  Does not seem to have CHF exacerbation.  BNP 562 -Continue home Lasix         breast cancer (s/p of mastectomy, on chemo therapy) --chemo on hold due to active infection --outpatient followup with oncology   DVT prophylaxis: Lovenox SQ Code Status: Full code  Family Communication:  Level of care: Telemetry Medical Dispo:   The patient is from: home Anticipated d/c is to: home Anticipated d/c date is: likely tomorrow Patient currently is not medically ready to d/c due to: hematuria, dropping Hgb   Subjective and Interval History:  Pain controlled.  Still having gross hematuria.   Objective: Vitals:   07/16/21 0611 07/16/21 0816 07/16/21 1406 07/16/21 1533  BP: (!) 146/72 (!) 150/65 (!) 156/76 (!) 154/71  Pulse: (!) 51 (!) 51 (!) 52 (!) 52  Resp: 18 18 18 18   Temp: 97.6 F (36.4 C) 97.6 F (36.4 C) 97.7 F (36.5 C) (!) 97.5 F (36.4 C)  TempSrc:  Oral Oral Oral  SpO2: 96% 100% 97% 99%  Weight:      Height:        Intake/Output Summary (Last 24 hours) at 07/16/2021 1805 Last data filed at 07/16/2021 1424 Gross per 24 hour  Intake 240 ml  Output 350 ml  Net -110 ml   Filed Weights   07/14/21 0443 07/15/21 0425 07/16/21 0500  Weight: 121.6 kg 130.8 kg 129.6 kg    Examination:   Constitutional: NAD, AAOx3 HEENT: conjunctivae and lids normal, EOMI CV: No cyanosis.   RESP: normal respiratory effort,  on RA Neuro: II - XII grossly intact.   Psych: Normal mood and affect.  Appropriate judgement and reason   Data Reviewed: I have personally reviewed following labs and imaging studies  CBC: Recent Labs  Lab 07/12/21 0309 07/13/21 0412 07/15/21 0413 07/16/21 0452  WBC 7.1 2.5* 4.7 4.7  HGB 11.5* 9.4* 8.8* 8.9*  HCT 35.2* 28.9* 27.2* 27.9*  MCV 94.6 93.2 92.8 93.3  PLT 257 222 208 211   Basic Metabolic  Panel: Recent Labs  Lab 07/12/21 0309 07/13/21 0412 07/15/21 0413 07/16/21 0452  NA 141 142 139 137  K 4.2 4.3 4.2 3.6  CL 109 111 104 104  CO2 23 23 28 29   GLUCOSE 135* 130* 96 95  BUN 12 10 21 15   CREATININE 1.02* 0.71 0.92 0.68  CALCIUM 8.8* 8.5* 8.2* 8.0*  MG  --   --  2.3 2.2   GFR: Estimated Creatinine Clearance: 91.4 mL/min (by C-G formula based on SCr of 0.68 mg/dL). Liver Function Tests: Recent Labs  Lab 07/12/21 0309  AST 29  ALT 20  ALKPHOS 91  BILITOT 0.9  PROT 7.1  ALBUMIN 3.7   Recent Labs  Lab 07/12/21 0309  LIPASE 22   No results for input(s): AMMONIA in the last 168 hours. Coagulation Profile: No results for input(s): INR, PROTIME in the last 168 hours. Cardiac Enzymes: No results for input(s): CKTOTAL, CKMB, CKMBINDEX, TROPONINI in the last 168 hours. BNP (last 3 results) No results for input(s): PROBNP in the last 8760 hours. HbA1C: No results for input(s): HGBA1C in the last 72 hours. CBG: Recent Labs  Lab 07/13/21 0816 07/14/21 0837 07/15/21 0739 07/16/21 0817 07/16/21 0946  GLUCAP 131* 119* 87 64* 86   Lipid Profile: No results for input(s): CHOL, HDL, LDLCALC, TRIG, CHOLHDL, LDLDIRECT in the last 72 hours. Thyroid Function Tests: No results for input(s): TSH, T4TOTAL, FREET4, T3FREE, THYROIDAB in the last 72 hours. Anemia Panel: No results for input(s): VITAMINB12, FOLATE, FERRITIN, TIBC, IRON, RETICCTPCT in the last 72 hours. Sepsis Labs: No results for input(s): PROCALCITON, LATICACIDVEN in the last 168 hours.  Recent Results (from the past 240 hour(s))  Urine Culture     Status: Abnormal   Collection Time: 07/12/21  8:48 AM   Specimen: Urine, Random  Result Value Ref Range Status   Specimen Description   Final    URINE, RANDOM Performed at [facility], [address] [place], [place] [address]    Special Requests   Final    NONE Performed at [facility], [address], [place], [place]  [address]    Culture (A)  Final    <10,000 COLONIES/mL INSIGNIFICANT GROWTH Performed at [facility] [address] [place], [place] [address]    Report Status 07/13/2021 FINAL  Final  Resp Panel by RT-PCR (Flu A&B, Covid) Nasopharyngeal Swab     Status: None   Collection Time: 07/12/21  9:45 AM   Specimen: Nasopharyngeal Swab; Nasopharyngeal(NP) swabs in vial transport medium  Result Value Ref Range Status   SARS Coronavirus 2 by RT PCR NEGATIVE NEGATIVE Final    Comment: (NOTE) SARS-CoV-2 target nucleic acids are NOT DETECTED.  The SARS-CoV-2 RNA is generally detectable in upper respiratory specimens during the acute phase of infection. The lowest concentration of SARS-CoV-2 viral copies this assay can detect is 138 copies/mL. A negative result does not preclude SARS-Cov-2 infection and should not be used as the sole basis for treatment or other patient management  decisions. A negative result may occur with  improper specimen collection/handling, submission of specimen other than nasopharyngeal swab, presence of viral mutation(s) within the areas targeted by this assay, and inadequate number of viral copies(<138 copies/mL). A negative result must be combined with clinical observations, patient history, and epidemiological information. The expected result is Negative.  Fact Sheet for Patients:  EntrepreneurPulse.com.au  Fact Sheet for Healthcare Providers:  IncredibleEmployment.be  This test is no t yet approved or cleared by the [place] FDA and  has been authorized for detection and/or diagnosis of SARS-CoV-2 by FDA under an Emergency Use Authorization (EUA). This EUA will remain  in effect (meaning this test can be used) for the duration of the COVID-19 declaration under Section 564(b)(1) of the Act, 21 U.S.C.section 360bbb-3(b)(1), unless the authorization is terminated  or revoked sooner.       Influenza A by PCR NEGATIVE  NEGATIVE Final   Influenza B by PCR NEGATIVE NEGATIVE Final    Comment: (NOTE) The Xpert Xpress SARS-CoV-2/FLU/RSV plus assay is intended as an aid in the diagnosis of influenza from Nasopharyngeal swab specimens and should not be used as a sole basis for treatment. Nasal washings and aspirates are unacceptable for Xpert Xpress SARS-CoV-2/FLU/RSV testing.  Fact Sheet for Patients: EntrepreneurPulse.com.au  Fact Sheet for Healthcare Providers: IncredibleEmployment.be  This test is not yet approved or cleared by the [place] FDA and has been authorized for detection and/or diagnosis of SARS-CoV-2 by FDA under an Emergency Use Authorization (EUA). This EUA will remain in effect (meaning this test can be used) for the duration of the COVID-19 declaration under Section 564(b)(1) of the Act, 21 U.S.C. section 360bbb-3(b)(1), unless the authorization is terminated or revoked.  Performed at [facility], [address] [place], [place] [address]   Culture, blood (Routine X 2) w Reflex to ID Panel     Status: None (Preliminary result)   Collection Time: 07/12/21 11:57 PM   Specimen: BLOOD  Result Value Ref Range Status   Specimen Description BLOOD LEFT WRIST  Final   Special Requests   Final    IN PEDIATRIC BOTTLE Blood Culture results may not be optimal due to an excessive volume of blood received in culture bottles   Culture   Final    NO GROWTH 3 DAYS Performed at [facility], [address], [place], [place] [address]    Report Status PENDING  Incomplete  Culture, blood (Routine X 2) w Reflex to ID Panel     Status: None (Preliminary result)   Collection Time: 07/12/21 11:57 PM   Specimen: BLOOD  Result Value Ref Range Status   Specimen Description BLOOD LEFT ASSIST CONTROL  Final   Special Requests   Final    BOTTLES DRAWN AEROBIC AND ANAEROBIC Blood Culture results may not be optimal due to an excessive volume of  blood received in culture bottles   Culture   Final    NO GROWTH 3 DAYS Performed at [facility], [address], [place], [place] [address]    Report Status PENDING  Incomplete      Radiology Studies: No results found.   Scheduled Meds:  cholecalciferol  5,000 Units Oral Daily   DULoxetine  30 mg Oral BID   enoxaparin (LOVENOX) injection  60 mg Subcutaneous Q24H   furosemide  20 mg Oral Daily   hydrALAZINE  50 mg Oral Q8H   multivitamin with minerals  1 tablet Oral Daily   ranolazine  500 mg  Oral BID   rOPINIRole  2 mg Oral QHS   rosuvastatin  20 mg Oral Daily   Continuous Infusions:  cefTRIAXone (ROCEPHIN)  IV 1 g (07/16/21 0907)     LOS: 4 days     Enzo Bi, MD [facility] If 7PM-7AM, please contact night-coverage 07/16/2021, 6:05 PM

## 2021-07-17 DIAGNOSIS — N135 Crossing vessel and stricture of ureter without hydronephrosis: Secondary | ICD-10-CM | POA: Diagnosis not present

## 2021-07-17 LAB — CBC
HCT: 29.3 % — ABNORMAL LOW (ref 36.0–46.0)
Hemoglobin: 9.6 g/dL — ABNORMAL LOW (ref 12.0–15.0)
MCH: 30.7 pg (ref 26.0–34.0)
MCHC: 32.8 g/dL (ref 30.0–36.0)
MCV: 93.6 fL (ref 80.0–100.0)
Platelets: 206 10*3/uL (ref 150–400)
RBC: 3.13 MIL/uL — ABNORMAL LOW (ref 3.87–5.11)
RDW: 15 % (ref 11.5–15.5)
WBC: 4.7 10*3/uL (ref 4.0–10.5)
nRBC: 0 % (ref 0.0–0.2)

## 2021-07-17 LAB — GLUCOSE, CAPILLARY: Glucose-Capillary: 82 mg/dL (ref 70–99)

## 2021-07-17 LAB — BASIC METABOLIC PANEL
Anion gap: 8 (ref 5–15)
BUN: 9 mg/dL (ref 8–23)
CO2: 29 mmol/L (ref 22–32)
Calcium: 8.1 mg/dL — ABNORMAL LOW (ref 8.9–10.3)
Chloride: 100 mmol/L (ref 98–111)
Creatinine, Ser: 0.63 mg/dL (ref 0.44–1.00)
GFR, Estimated: >60 mL/min (ref >60)
Glucose, Bld: 91 mg/dL (ref 70–99)
Potassium: 3.6 mmol/L (ref 3.5–5.1)
Sodium: 137 mmol/L (ref 135–145)

## 2021-07-17 LAB — MAGNESIUM: Magnesium: 2.1 mg/dL (ref 1.7–2.4)

## 2021-07-17 MED ORDER — HYDRALAZINE HCL 50 MG PO TABS
50.0000 mg | ORAL_TABLET | Freq: Three times a day (TID) | ORAL | 2 refills | Status: DC
Start: 2021-07-17 — End: 2021-08-09

## 2021-07-17 MED ORDER — BISOPROLOL FUMARATE 5 MG PO TABS: ORAL_TABLET | ORAL | 1 refills | Status: DC

## 2021-07-17 MED ORDER — CIPROFLOXACIN HCL 500 MG PO TABS: 500.0000 mg | ORAL_TABLET | Freq: Two times a day (BID) | ORAL | 0 refills | Status: AC

## 2021-07-17 MED ORDER — OXYCODONE HCL 5 MG PO TABS: 5.0000 mg | ORAL_TABLET | Freq: Four times a day (QID) | ORAL | 0 refills | Status: AC | PRN

## 2021-07-17 MED ORDER — DIPHENHYDRAMINE HCL 25 MG PO CAPS: 25.0000 mg | ORAL_CAPSULE | Freq: Three times a day (TID) | ORAL | 0 refills | Status: DC | PRN

## 2021-07-17 MED ORDER — DILTIAZEM HCL ER COATED BEADS 240 MG PO CP24: ORAL_CAPSULE | ORAL | 2 refills | Status: DC

## 2021-07-17 MED ORDER — APIXABAN 5 MG PO TABS: ORAL_TABLET | ORAL | 1 refills | Status: DC

## 2021-07-17 NOTE — Discharge Summary (Signed)
Physician Discharge Summary   Amber Barnes  female DOB: [birth year]  KCL:[id number]  PCP: [name], FNP  Admit date: 07/12/2021 Discharge date: 07/17/2021  Admitted From: home Disposition:  home CODE STATUS: Full code  Discharge Instructions     Discharge instructions   Complete by: As directed    You have received 6 days of IV antibiotic for your kidney infection.  Please continue 8 more days of oral antibiotic Cipro at home as directed.  Please follow up with urology for your stents.  Your home bisoprolol and Cardizem are held due to your low heart rate.  Hydralazine is started to control your blood pressure instead.  Please hold Eliquis due to significant blood in your urine.  Follow up with outpatient provider to see when you can resume this blood thinner.   Dr. Enzo Bi [facility] Course:  For full details, please see H&P, progress notes, consult notes and ancillary notes.  Briefly,  Amber Barnes is a 71 y.o. female with medical history significant of hypertension, COPD, depression with anxiety, OSA not on CPAP, dCHF, PUD, mitral valve regurgitation, breast cancer (s/p mastectomy, on chemo therapy), atrial fibrillation on Eliquis, CKD-II, who presented with abdominal pain.   Patient was recently hospitalized from 12/23-12/28 due to acute left  pyelonephritis.  She just finished a course of ciprofloxacin treatment.    Acute pyelonephritis and ureterovesical junction (UVJ) obstruction and right hydroureteronephrosis --Dr. Diamantina Providence of urology did urgent Bilateral ureteral stent placement.   --started on IV Rocephin --urine cx insignificant growth, likely due to pt having been on abx recently --Pt received 6 days of ceftriaxone and was discharged on 8 more days of Cipro, per urology rec. --outpatient followup with urology.   Hematuria --in the setting of pyelo, urological procedure and taking Eliquis --Hematuria improved 2 days after holding  Eliquis. --cont to hold home Eliquis pending outpatient followup.   Acute blood loss anemia --2/2 hematuria --Hgb 11.5 on presentation, dropped to 8.8 on 1/5.  Eliquis held and hematuria improved, and Hgb trended up to 9.6 prior to discharge.   Hypertension --hold home bisoprolol and cardizem due to bradycardia --started on hydralazine 50 mg q8h (new) to control BP instead.   Atrial fibrillation (HCC) --hold home bisoprolol and cardizem due to bradycardia --hold Eliquis due to hematuria   Diarrhea with cipro for 10 days recently, improved   CKD (chronic kidney disease), stage II: Stable, creatinine 1.02, BUN 12    Chronic diastolic CHF (congestive heart failure) ([place]): 2D echo on 03/08/2021 showed EF of 60 to 65% with grade 1 diastolic dysfunction.  She has trace leg edema, no JVD.  Does not seem to have CHF exacerbation.  BNP 562 -Continue home Lasix         breast cancer (s/p of mastectomy, on chemo therapy) --chemo on hold due to active infection --outpatient followup with oncology   Discharge Diagnoses:  Principal Problem:   Ureterovesical junction (UVJ) obstruction Active Problems:   Hypertension   Anxiety and depression   Atrial fibrillation (HCC)   Diarrhea   Acute pyelonephritis   CKD (chronic kidney disease), stage II   HLD (hyperlipidemia)   Chronic diastolic CHF (congestive heart failure) (HCC)   Hydroureteronephrosis   30 Day Unplanned Readmission Risk Score    Flowsheet Row ED to Hosp-Admission (Current) from 07/12/2021 in [place]  30 Day Unplanned Readmission Risk Score (%) 35.36 Filed at 07/17/2021 0800  This score is the patient's risk of an unplanned readmission within 30 days of being discharged (0 -100%). The score is based on dignosis, age, lab data, medications, orders, and past utilization.   Low:  0-14.9   Medium: 15-21.9   High: 22-29.9   Extreme: 30 and above         Discharge  Instructions:  Allergies as of 07/17/2021       Reactions   Prednisone Other (See Comments)   Agitation, hyperactivity, polyphagia   Amiodarone Nausea And Vomiting   Hydrocodone-acetaminophen Other (See Comments)   Hallucinations and combative   Oxycodone Itching   Can take it with benadryl   Tapentadol Itching   Tramadol Itching   Can take it with benadryl   Adhesive [tape] Itching, Rash   Prefers paper tape   Latex Itching, Rash   use paper tap        Medication List     STOP taking these medications    acetaminophen-codeine 300-30 MG tablet Commonly known as: TYLENOL #3   potassium chloride SA 20 MEQ tablet Commonly known as: KLOR-CON M   VITAMIN B COMPLEX PO       TAKE these medications    acetaminophen 500 MG tablet Commonly known as: TYLENOL Take 500-1,000 mg by mouth every 6 (six) hours as needed for mild pain or moderate pain.   ALPRAZolam 0.25 MG tablet Commonly known as: XANAX Take 0.25 mg by mouth at bedtime as needed for anxiety or sleep.   apixaban 5 MG Tabs tablet Commonly known as: Eliquis Hold until followup with outpatient provider due to bloody urine. What changed:  how much to take how to take this when to take this additional instructions   azelastine 0.1 % nasal spray Commonly known as: ASTELIN Place 1-2 sprays into both nostrils daily as needed for rhinitis.   bisoprolol 5 MG tablet Commonly known as: ZEBETA Hold until followup with outpatient provider due to your low heart rate. What changed:  how much to take how to take this when to take this additional instructions   busPIRone 5 MG tablet Commonly known as: BUSPAR TAKE 1 TABLET BY MOUTH THREE TIMES A DAY AS NEEDED   ciprofloxacin 500 MG tablet Commonly known as: Cipro Take 1 tablet (500 mg total) by mouth 2 (two) times daily for 8 days. Start taking on: July 18, 2021   diltiazem 240 MG 24 hr capsule Commonly known as: Cardizem CD Hold until followup with  outpatient provider due to your low heart rate. What changed:  how much to take how to take this when to take this additional instructions   diphenhydrAMINE 25 mg capsule Commonly known as: BENADRYL Take 1 capsule (25 mg total) by mouth every 8 (eight) hours as needed for itching or allergies.   DULoxetine 30 MG capsule Commonly known as: CYMBALTA Take 1 capsule (30 mg total) by mouth 2 (two) times daily. Home med.   esomeprazole 20 MG capsule Commonly known as: NEXIUM Take 20 mg by mouth daily as needed (GERD symptoms).   furosemide 20 MG tablet Commonly known as: LASIX Take 1 tablet (20 mg total) by mouth daily. What changed: additional instructions   hydrALAZINE 50 MG tablet Commonly known as: APRESOLINE Take 1 tablet (50 mg total) by mouth 3 (three) times daily.   lidocaine-prilocaine cream Commonly known as: EMLA Apply to affected area once   loperamide 2 MG capsule Commonly known as: IMODIUM Take 1 capsule (2 mg total) by mouth See  admin instructions. Initial: 4 mg, followed by 2 mg after each loose stool; maximum: 16 mg/day   magic mouthwash Soln Take 5 mLs by mouth 2 (two) times daily.   multivitamin tablet Take 1 tablet by mouth daily.   nystatin powder Commonly known as: MYCOSTATIN/NYSTOP Apply 1 application topically 3 (three) times daily.   ondansetron 8 MG tablet Commonly known as: ZOFRAN Take 1 tablet (8 mg total) by mouth every 8 (eight) hours as needed for nausea or vomiting.   oxyCODONE 5 MG immediate release tablet Commonly known as: Oxy IR/ROXICODONE Take 1-2 tablets (5-10 mg total) by mouth every 6 (six) hours as needed for up to 5 days for moderate pain or severe pain.   prochlorperazine 10 MG tablet Commonly known as: COMPAZINE Take 1 tablet (10 mg total) by mouth every 6 (six) hours as needed (Nausea or vomiting).   promethazine 25 MG tablet Commonly known as: PHENERGAN Take 1 tablet (25 mg total) by mouth every 6 (six) hours as  needed for nausea or vomiting.   ranolazine 500 MG 12 hr tablet Commonly known as: RANEXA TAKE 1 TABLET BY MOUTH TWICE A DAY   rizatriptan 10 MG tablet Commonly known as: MAXALT TAKE 1 TABLET (10 MG TOTAL) BY MOUTH AS NEEDED FOR MIGRAINE. MAY REPEAT IN 2 HOURS IF NEEDED   rOPINIRole 2 MG tablet Commonly known as: Requip Take 1 tablet (2 mg total) by mouth at bedtime.   rosuvastatin 20 MG tablet Commonly known as: CRESTOR TAKE 1 TABLET BY MOUTH EVERY DAY   Vitamin D3 125 MCG (5000 UT) Caps Take 5,000 Units by mouth daily.         Follow-up Information     [name], FNP Follow up in 1 week(s).   Specialty: Family Medicine Contact information: [place] [address] [place] [address] [phone]         Billey Co, MD Follow up in 1 week(s).   Specialty: Urology Contact information: [place] [address] [phone]                 Allergies  Allergen Reactions   Prednisone Other (See Comments)    Agitation, hyperactivity, polyphagia   Amiodarone Nausea And Vomiting   Hydrocodone-Acetaminophen Other (See Comments)    Hallucinations and combative   Oxycodone Itching    Can take it with benadryl   Tapentadol Itching   Tramadol Itching    Can take it with benadryl   Adhesive [Tape] Itching and Rash    Prefers paper tape   Latex Itching and Rash    use paper tap     The results of significant diagnostics from this hospitalization (including imaging, microbiology, ancillary and laboratory) are listed below for reference.   Consultations:   Procedures/Studies: DG Chest 2 View  Result Date: 07/02/2021 CLINICAL DATA:  Chest pain and abdominal pain. EXAM: CHEST - 2 VIEW COMPARISON:  04/05/2021 and CT chest 08/23/2018. FINDINGS: Trachea is midline. Heart is enlarged. Left IJ power port tip is in the SVC. Lungs are clear. No pleural fluid. IMPRESSION: No acute findings. Electronically Signed   By: Lorin Picket M.D.   On: 07/02/2021 13:22   CT Angio Chest PE W/Cm &/Or Wo Cm  Result Date: 07/02/2021 CLINICAL DATA:  Upper abdominal and chest pain. High probability for PE. EXAM: CT ANGIOGRAPHY CHEST WITH CONTRAST TECHNIQUE: Multidetector CT imaging of the chest was performed using the standard protocol during bolus administration of intravenous  contrast. Multiplanar CT image reconstructions and MIPs were obtained to evaluate the vascular anatomy. CONTRAST:  189mL OMNIPAQUE IOHEXOL 350 MG/ML SOLN COMPARISON:  None. FINDINGS: Cardiovascular: Satisfactory opacification of the pulmonary arteries to the segmental level. No evidence of pulmonary embolism. Normal heart size. No pericardial effusion. The main pulmonary artery is mildly enlarged at 3.2 cm. Trace atherosclerotic calcifications visualized along the left anterior descending coronary artery. Left IJ approach port catheter visualized. The tip of the catheter terminates at the cavoatrial junction. Mediastinum/Nodes: No enlarged mediastinal, hilar, or axillary lymph nodes. Thyroid gland, trachea, and esophagus demonstrate no significant findings. Lungs/Pleura: Lungs are clear. No pleural effusion or pneumothorax. Upper Abdomen: No acute abnormality. Musculoskeletal: No chest wall abnormality. No acute or significant osseous findings. Review of the MIP images confirms the above findings. IMPRESSION: 1. No evidence of pulmonary embolus, pneumonia or other acute cardiopulmonary process. 2. Mild enlargement of the main pulmonary artery at 3.2 cm. Findings can be seen in the setting of pulmonary arterial hypertension. 3. Trace atherosclerotic calcifications along the thoracic aorta and left anterior descending coronary artery. Aortic Atherosclerosis (ICD10-I70.0). Electronically Signed   By: Jacqulynn Cadet M.D.   On: 07/02/2021 15:58   CT ABDOMEN PELVIS W CONTRAST  Result Date: 07/12/2021 CLINICAL DATA:  Flank pain with kidney stone suspected EXAM: CT ABDOMEN  AND PELVIS WITH CONTRAST TECHNIQUE: Multidetector CT imaging of the abdomen and pelvis was performed using the standard protocol following bolus administration of intravenous contrast. CONTRAST:  136mL OMNIPAQUE IOHEXOL 300 MG/ML  SOLN COMPARISON:  07/02/2021 FINDINGS: Lower chest:  No acute finding. Hepatobiliary: No focal liver abnormality.No evidence of biliary obstruction or stone. Pancreas: Generalized atrophy Spleen: Unremarkable. Adrenals/Urinary Tract: Negative adrenals. Right hydroureteronephrosis with perinephric stranding presumably to a 3 mm right UVJ calculus suggested on coronal reformats, but the pelvis was obscured by artifact from a right hip prosthesis. Layering calcification in the left renal pelvis and present in the bilateral calices which are unchanged. Left perinephric stranding is improved. Low densities in the left renal cortex are unchanged. Collapsed bladder which is largely obscured by streak artifact. Stomach/Bowel:  No obstruction. Appendectomy. Vascular/Lymphatic: No acute vascular abnormality. Atheromatous calcification. No mass or adenopathy. Reproductive:Hysterectomy Other: No ascites or pneumoperitoneum. Musculoskeletal: No acute abnormalities. Generalized lumbar spine degeneration. Total right hip arthroplasty. IMPRESSION: 1. Right hydroureteronephrosis likely due to a 3 mm UVJ calculus, although certainty is limited by extensive artifact from a right hip prosthesis. 2. Bilateral nephrolithiasis. Electronically Signed   By: Jorje Guild M.D.   On: 07/12/2021 05:26   CT Abdomen Pelvis W Contrast  Result Date: 07/02/2021 CLINICAL DATA:  Upper abdominal and chest pain nausea EXAM: CT ABDOMEN AND PELVIS WITH CONTRAST TECHNIQUE: Multidetector CT imaging of the abdomen and pelvis was performed using the standard protocol following bolus administration of intravenous contrast. CONTRAST:  110mL OMNIPAQUE IOHEXOL 350 MG/ML SOLN COMPARISON:  CT 04/05/2021, 11/05/2019 FINDINGS: Lower  chest: Lung bases demonstrate no acute consolidation or effusion. Mild cardiomegaly. Hepatobiliary: No calcified gallstone.  No biliary dilatation. Pancreas: Unremarkable. No pancreatic ductal dilatation or surrounding inflammatory changes. Spleen: Normal in size without focal abnormality. Adrenals/Urinary Tract: Adrenal glands are normal. Previously noted dominant cyst midpole left kidney is decreased with small residual. New left perinephric stranding and fluid. Additional hypodense renal lesions grossly unchanged. Mild left hydronephrosis without obstructing stone. Probable punctate kidney stones. Mild stranding about the left ureter. The bladder is unremarkable. Stomach/Bowel: The stomach is nonenlarged. No dilated small bowel. No acute bowel wall thickening. Mild diverticular  disease of the left colon. Vascular/Lymphatic: Moderate aortic atherosclerosis. No aneurysm. No suspicious nodes. Reproductive: Status post hysterectomy. No adnexal masses. Other: Negative for pelvic effusion or free air. Musculoskeletal: No acute or suspicious osseous abnormality. Right hip replacement with artifact. IMPRESSION: 1. Mild left hydronephrosis with new left perinephric fat stranding and small amount of perinephric fluid. Mild soft tissue stranding about the left ureter. Findings could be secondary to recently passed kidney stone versus ascending urinary tract infection. A dominant cyst at the midpole left kidney has significantly decreased in size and rupture of the cyst could be contributing to some of the left perinephric fluid. 2. Negative for bowel obstruction or bowel wall thickening 3. Mild cardiomegaly. 4. Diverticular disease of the left colon without acute wall thickening. 5. Nonobstructing punctate kidney stones. Electronically Signed   By: Donavan Foil M.D.   On: 07/02/2021 16:17   DG OR UROLOGY CYSTO IMAGE ([facility] ONLY)  Result Date: 07/12/2021 There is no interpretation for this exam.  This order is for images  obtained during a surgical procedure.  Please See "Surgeries" Tab for more information regarding the procedure.      Labs: BNP (last 3 results) Recent Labs    07/12/21 0309  BNP 94.8   Basic Metabolic Panel: Recent Labs  Lab 07/12/21 0309 07/13/21 0412 07/15/21 0413 07/16/21 0452 07/17/21 0619  NA 141 142 139 137 137  K 4.2 4.3 4.2 3.6 3.6  CL 109 111 104 104 100  CO2 23 23 28 29 29   GLUCOSE 135* 130* 96 95 91  BUN 12 10 21 15 9   CREATININE 1.02* 0.71 0.92 0.68 0.63  CALCIUM 8.8* 8.5* 8.2* 8.0* 8.1*  MG  --   --  2.3 2.2 2.1   Liver Function Tests: Recent Labs  Lab 07/12/21 0309  AST 29  ALT 20  ALKPHOS 91  BILITOT 0.9  PROT 7.1  ALBUMIN 3.7   Recent Labs  Lab 07/12/21 0309  LIPASE 22   No results for input(s): AMMONIA in the last 168 hours. CBC: Recent Labs  Lab 07/12/21 0309 07/13/21 0412 07/15/21 0413 07/16/21 0452 07/17/21 0619  WBC 7.1 2.5* 4.7 4.7 4.7  HGB 11.5* 9.4* 8.8* 8.9* 9.6*  HCT 35.2* 28.9* 27.2* 27.9* 29.3*  MCV 94.6 93.2 92.8 93.3 93.6  PLT 257 222 208 198 206   Cardiac Enzymes: No results for input(s): CKTOTAL, CKMB, CKMBINDEX, TROPONINI in the last 168 hours. BNP: Invalid input(s): POCBNP CBG: Recent Labs  Lab 07/14/21 0837 07/15/21 0739 07/16/21 0817 07/16/21 0946 07/17/21 0805  GLUCAP 119* 87 64* 86 82   D-Dimer No results for input(s): DDIMER in the last 72 hours. Hgb A1c No results for input(s): HGBA1C in the last 72 hours. Lipid Profile No results for input(s): CHOL, HDL, LDLCALC, TRIG, CHOLHDL, LDLDIRECT in the last 72 hours. Thyroid function studies No results for input(s): TSH, T4TOTAL, T3FREE, THYROIDAB in the last 72 hours.  Invalid input(s): FREET3 Anemia work up No results for input(s): VITAMINB12, FOLATE, FERRITIN, TIBC, IRON, RETICCTPCT in the last 72 hours. Urinalysis    Component Value Date/Time   COLORURINE AMBER (A) 07/12/2021 0848   APPEARANCEUR CLOUDY (A) 07/12/2021 0848   APPEARANCEUR  Clear 10/10/2014 2137   LABSPEC 1.035 (H) 07/12/2021 0848   LABSPEC 1.006 10/10/2014 2137   PHURINE 7.0 07/12/2021 0848   GLUCOSEU 50 (A) 07/12/2021 0848   GLUCOSEU Negative 10/10/2014 2137   HGBUR LARGE (A) 07/12/2021 0848   BILIRUBINUR NEGATIVE 07/12/2021 0848   BILIRUBINUR  neg 05/17/2016 1408   BILIRUBINUR Negative 10/10/2014 2137   KETONESUR NEGATIVE 07/12/2021 0848   PROTEINUR 100 (A) 07/12/2021 0848   UROBILINOGEN 0.2 05/17/2016 1408   NITRITE NEGATIVE 07/12/2021 0848   LEUKOCYTESUR TRACE (A) 07/12/2021 0848   LEUKOCYTESUR Negative 10/10/2014 2137   Sepsis Labs Invalid input(s): PROCALCITONIN,  WBC,  LACTICIDVEN Microbiology Recent Results (from the past 240 hour(s))  Urine Culture     Status: Abnormal   Collection Time: 07/12/21  8:48 AM   Specimen: Urine, Random  Result Value Ref Range Status   Specimen Description   Final    URINE, RANDOM Performed at [facility], [address] [place], [place] [address]    Special Requests   Final    NONE Performed at [facility], [address], [place], [place] [address]    Culture (A)  Final    <10,000 COLONIES/mL INSIGNIFICANT GROWTH Performed at [facility], [place] [address] [place], [place] [address]    Report Status 07/13/2021 FINAL  Final  Resp Panel by RT-PCR (Flu A&B, Covid) Nasopharyngeal Swab     Status: None   Collection Time: 07/12/21  9:45 AM   Specimen: Nasopharyngeal Swab; Nasopharyngeal(NP) swabs in vial transport medium  Result Value Ref Range Status   SARS Coronavirus 2 by RT PCR NEGATIVE NEGATIVE Final    Comment: (NOTE) SARS-CoV-2 target nucleic acids are NOT DETECTED.  The SARS-CoV-2 RNA is generally detectable in upper respiratory specimens during the acute phase of infection. The lowest concentration of SARS-CoV-2 viral copies this assay can detect is 138 copies/mL. A negative result does not preclude SARS-Cov-2 infection and should not be used as the sole basis for  treatment or other patient management decisions. A negative result may occur with  improper specimen collection/handling, submission of specimen other than nasopharyngeal swab, presence of viral mutation(s) within the areas targeted by this assay, and inadequate number of viral copies(<138 copies/mL). A negative result must be combined with clinical observations, patient history, and epidemiological information. The expected result is Negative.  Fact Sheet for Patients:  EntrepreneurPulse.com.au  Fact Sheet for Healthcare Providers:  IncredibleEmployment.be  This test is no t yet approved or cleared by the [place] FDA and  has been authorized for detection and/or diagnosis of SARS-CoV-2 by FDA under an Emergency Use Authorization (EUA). This EUA will remain  in effect (meaning this test can be used) for the duration of the COVID-19 declaration under Section 564(b)(1) of the Act, 21 U.S.C.section 360bbb-3(b)(1), unless the authorization is terminated  or revoked sooner.       Influenza A by PCR NEGATIVE NEGATIVE Final   Influenza B by PCR NEGATIVE NEGATIVE Final    Comment: (NOTE) The Xpert Xpress SARS-CoV-2/FLU/RSV plus assay is intended as an aid in the diagnosis of influenza from Nasopharyngeal swab specimens and should not be used as a sole basis for treatment. Nasal washings and aspirates are unacceptable for Xpert Xpress SARS-CoV-2/FLU/RSV testing.  Fact Sheet for Patients: EntrepreneurPulse.com.au  Fact Sheet for Healthcare Providers: IncredibleEmployment.be  This test is not yet approved or cleared by the [place] FDA and has been authorized for detection and/or diagnosis of SARS-CoV-2 by FDA under an Emergency Use Authorization (EUA). This EUA will remain in effect (meaning this test can be used) for the duration of the COVID-19 declaration under Section 564(b)(1) of the Act, 21  U.S.C. section 360bbb-3(b)(1), unless the authorization is terminated or revoked.  Performed at [facility], [place],  [place], [place] [address]   Culture, blood (Routine X 2) w Reflex to ID Panel     Status: None (Preliminary result)   Collection Time: 07/12/21 11:57 PM   Specimen: BLOOD  Result Value Ref Range Status   Specimen Description BLOOD LEFT WRIST  Final   Special Requests   Final    IN PEDIATRIC BOTTLE Blood Culture results may not be optimal due to an excessive volume of blood received in culture bottles   Culture   Final    NO GROWTH 4 DAYS Performed at [facility], [address], [place], [place] [address]    Report Status PENDING  Incomplete  Culture, blood (Routine X 2) w Reflex to ID Panel     Status: None (Preliminary result)   Collection Time: 07/12/21 11:57 PM   Specimen: BLOOD  Result Value Ref Range Status   Specimen Description BLOOD LEFT ASSIST CONTROL  Final   Special Requests   Final    BOTTLES DRAWN AEROBIC AND ANAEROBIC Blood Culture results may not be optimal due to an excessive volume of blood received in culture bottles   Culture   Final    NO GROWTH 4 DAYS Performed at [facility], [address] [place], [place] [address]    Report Status PENDING  Incomplete     Total time spend on discharging this patient, including the last patient exam, discussing the hospital stay, instructions for ongoing care as it relates to all pertinent caregivers, as well as preparing the medical discharge records, prescriptions, and/or referrals as applicable, is 30 minutes.    Enzo Bi, MD  [facility] 07/17/2021, 9:32 AM

## 2021-07-18 LAB — CULTURE, BLOOD (ROUTINE X 2)
Culture: NO GROWTH
Culture: NO GROWTH

## 2021-07-19 ENCOUNTER — Ambulatory Visit: Payer: Medicare Other

## 2021-07-19 ENCOUNTER — Telehealth: Payer: Self-pay

## 2021-07-19 NOTE — Telephone Encounter (Signed)
Transition Care Management Unsuccessful Follow-up Telephone Call  Date of discharge and from where:  07/17/21 [facility]  Attempts:  1st Attempt  Reason for unsuccessful TCM follow-up call:  Unable to reach patient. Will follow as appropriate.

## 2021-07-20 NOTE — Telephone Encounter (Signed)
Transition Care Management Unsuccessful Follow-up Telephone Call  No follow up scheduled with PCP at this  time per patient preference.   Date of discharge and from where:  07/17/21 [facility]  Patient notes: She is scheduled and following up with urology and oncology this week. Agrees to contact PCP as needed.

## 2021-07-21 ENCOUNTER — Other Ambulatory Visit: Payer: Self-pay | Admitting: Family

## 2021-07-21 DIAGNOSIS — G2581 Restless legs syndrome: Secondary | ICD-10-CM

## 2021-07-22 ENCOUNTER — Other Ambulatory Visit: Payer: Self-pay

## 2021-07-22 ENCOUNTER — Ambulatory Visit: Payer: Medicare Other | Admitting: Urology

## 2021-07-22 ENCOUNTER — Encounter: Payer: Self-pay | Admitting: Urology

## 2021-07-22 ENCOUNTER — Telehealth: Payer: Self-pay

## 2021-07-22 VITALS — BP 126/65 | HR 85 | Ht 65.0 in | Wt 262.0 lb

## 2021-07-22 DIAGNOSIS — N390 Urinary tract infection, site not specified: Secondary | ICD-10-CM

## 2021-07-22 DIAGNOSIS — R310 Gross hematuria: Secondary | ICD-10-CM | POA: Diagnosis not present

## 2021-07-22 DIAGNOSIS — N200 Calculus of kidney: Secondary | ICD-10-CM

## 2021-07-22 NOTE — Progress Notes (Signed)
[place] [place] [facility] Posting Form   Surgery Date/Time: Date: 07/30/2021  Surgeon: Dr. Nickolas Madrid, MD  Surgery Location: Day Surgery  Inpt ( No  )   Outpt (Yes)   Obs ( No  )   Diagnosis: N20.1 Right Ureteral Stone, N20.0 Left Renal Stone  -CPT: [id number]  Surgery: Bilateral Ureteroscopy with laser lithotripsy and stent exchange  Stop Anticoagulations: No and may continue ASA  Cardiac/Medical/Pulmonary Clearance needed: no   *Orders entered into EPIC  Date: 07/22/21   *Case booked in EPIC  Date: 07/22/21  *Notified pt of Surgery: Date: 07/22/21  PRE-OP UA & CX: Yes, Obtained today 07/22/2021  *Placed into Prior Authorization Work Fabio Bering Date: 07/22/21   Assistant/laser/rep:No

## 2021-07-22 NOTE — Progress Notes (Signed)
° °  07/22/2021 12:29 PM   Amber Barnes 10-Mar-1951 [id number]  Reason for visit: Follow up UTI, right distal ureteral stone  HPI: Comorbid 71 year old female who had multiple hospitalizations in December for suspected pyelonephritis as well as abdominal pain, and ultimately was found to have a 3 mm right distal ureteral stone, suspected UTI/sepsis from urinary source, as well as retained left-sided renal contrast and a left renal stone.  She ultimately underwent cystoscopy and bilateral ureteral stent placement with me on 07/12/2021.  She was treated with a course of Cipro, and urine culture ultimately was negative, likely from being on outpatient antibiotics prior to admission.  She has done well with her ureteral stents in place and has had some intermittent hematuria and some occasional frequency but otherwise her pain has improved significantly from prior to stent placement.  I reviewed her CT with her again and the need for bilateral ureteroscopy, laser lithotripsy, stent change.  We specifically discussed the risks ureteroscopy including bleeding, infection/sepsis, stent related symptoms including flank pain/urgency/frequency/incontinence/dysuria, ureteral injury, inability to access stone, or need for staged or additional procedures.  Schedule bilateral ureteroscopy, laser lithotripsy, stent change  Billey Co, MD  [facility] [address] [place], [place] [address] [phone]

## 2021-07-22 NOTE — Patient Instructions (Signed)
Laser Therapy for Kidney Stones Laser therapy for kidney stones is a procedure to break up small, hard mineral deposits that form in the kidney (kidney stones). The procedure is done using a device that produces a focused beam of light (laser). The laser breaks up kidney stones into pieces that are small enough to be passed out of the body through urination or removed from the body during the procedure. You may need laser therapy if you have kidney stones that are painful or block your urinary tract. This procedure is done by inserting a tube (ureteroscope) into your kidney through the urethral opening. The urethra is the part of the body that drains urine from the bladder. In women, the urethra opens above the vaginal opening. In men, the urethra opens at the tip of the penis. The ureteroscope is inserted through the urethra, and surgical instruments are moved through the bladder and the muscular tube that connects the kidney to the bladder (ureter) until they reach the kidney. Tell a health care provider about: Any allergies you have. All medicines you are taking, including vitamins, herbs, eye drops, creams, and over-the-counter medicines. Any problems you or family members have had with anesthetic medicines. Any blood disorders you have. Any surgeries you have had. Any medical conditions you have. Whether you are pregnant or may be pregnant. What are the risks? Generally, this is a safe procedure. However, problems may occur, including: Infection. Bleeding. Allergic reactions to medicines. Damage to the urethra, bladder, or ureter. Urinary tract infection (UTI). Narrowing of the urethra (urethral stricture). Difficulty passing urine. Blockage of the kidney caused by a fragment of kidney stone. What happens before the procedure? Medicines Ask your health care provider about: Changing or stopping your regular medicines. This is especially important if you are taking diabetes medicines or  blood thinners. Taking medicines such as aspirin and ibuprofen. These medicines can thin your blood. Do not take these medicines unless your health care provider tells you to take them. Taking over-the-counter medicines, vitamins, herbs, and supplements. Eating and drinking Follow instructions from your health care provider about eating and drinking, which may include: 8 hours before the procedure - stop eating heavy meals or foods, such as meat, fried foods, or fatty foods. 6 hours before the procedure - stop eating light meals or foods, such as toast or cereal. 6 hours before the procedure - stop drinking milk or drinks that contain milk. 2 hours before the procedure - stop drinking clear liquids. Staying hydrated Follow instructions from your health care provider about hydration, which may include: Up to 2 hours before the procedure - you may continue to drink clear liquids, such as water, clear fruit juice, black coffee, and plain tea.  General instructions You may have a physical exam before the procedure. You may also have tests, such as imaging tests and blood or urine tests. If your ureter is too narrow, your health care provider may place a soft, flexible tube (stent) inside of it. The stent may be placed days or weeks before your laser therapy procedure. Plan to have someone take you home from the hospital or clinic. If you will be going home right after the procedure, plan to have someone stay with you for 24 hours. Do not use any products that contain nicotine or tobacco for at least 4 weeks before the procedure. These products include cigarettes, e-cigarettes, and chewing tobacco. If you need help quitting, ask your health care provider. Ask your health care provider: How your  surgical site will be marked or identified. What steps will be taken to help prevent infection. These may include: Removing hair at the surgery site. Washing skin with a germ-killing soap. Taking antibiotic  medicine. What happens during the procedure?  An IV will be inserted into one of your veins. You will be given one or more of the following: A medicine to help you relax (sedative). A medicine to numb the area (local anesthetic). A medicine to make you fall asleep (general anesthetic). A ureteroscope will be inserted into your urethra. The ureteroscope will send images to a video screen in the operating room to guide your surgeon to the area of your kidney that will be treated. A small, flexible tube will be threaded through the ureteroscope and into your bladder and ureter, up to your kidney. The laser device will be inserted into your kidney through the tube. Your surgeon will pulse the laser on and off to break up kidney stones. A surgical instrument that has a tiny wire basket may be inserted through the tube into your kidney to remove the pieces of broken kidney stone. The procedure may vary among health care providers and hospitals. What happens after the procedure? Your blood pressure, heart rate, breathing rate, and blood oxygen level will be monitored until you leave the hospital or clinic. You will be given pain medicine as needed. You may continue to receive antibiotics. You may have a stent temporarily placed in your ureter. Do not drive for 24 hours if you were given a sedative during your procedure. You may be given a strainer to collect any stone fragments that you pass in your urine. Your health care provider may have these tested. Summary Laser therapy for kidney stones is a procedure to break up kidney stones into pieces that are small enough to be passed out of the body through urination or removed during the procedure. Follow instructions from your health care provider about eating and drinking before the procedure. During the procedure, the ureteroscope will send images to a video screen to guide your surgeon to the area of your kidney that will be treated. Do not drive  for 24 hours if you were given a sedative during your procedure. This information is not intended to replace advice given to you by your health care provider. Make sure you discuss any questions you have with your health care provider. Document Revised: 03/01/2021 Document Reviewed: 03/01/2021 [employer] Patient Education  [employer] [place].

## 2021-07-22 NOTE — Telephone Encounter (Signed)
I spoke with Mrs. Urbani and her daughter. We have discussed possible surgery dates and 07/30/2021  was agreed upon by all parties. Patient given information about surgery date, what to expect pre-operatively and post operatively.   We discussed that a Pre-Admission Testing office will be calling to set up the pre-op visit that will take place prior to surgery, and that these appointments are typically done over the phone with a Pre-Admissions RN.   Informed patient that our office will communicate any additional care to be provided after surgery. Patients questions or concerns were discussed during our call. Advised to call our office should there be any additional information, questions or concerns that arise. Patient verbalized understanding.

## 2021-07-23 ENCOUNTER — Other Ambulatory Visit: Payer: Medicare Other

## 2021-07-23 ENCOUNTER — Inpatient Hospital Stay: Payer: Medicare Other | Attending: Oncology

## 2021-07-23 ENCOUNTER — Inpatient Hospital Stay (HOSPITAL_BASED_OUTPATIENT_CLINIC_OR_DEPARTMENT_OTHER): Payer: Medicare Other | Admitting: Oncology

## 2021-07-23 ENCOUNTER — Encounter: Payer: Self-pay | Admitting: Oncology

## 2021-07-23 ENCOUNTER — Inpatient Hospital Stay: Payer: Medicare Other

## 2021-07-23 VITALS — BP 101/68 | HR 63 | Temp 97.0°F | Resp 18 | Wt 265.2 lb

## 2021-07-23 DIAGNOSIS — D701 Agranulocytosis secondary to cancer chemotherapy: Secondary | ICD-10-CM | POA: Insufficient documentation

## 2021-07-23 DIAGNOSIS — C50919 Malignant neoplasm of unspecified site of unspecified female breast: Secondary | ICD-10-CM

## 2021-07-23 DIAGNOSIS — Z87891 Personal history of nicotine dependence: Secondary | ICD-10-CM | POA: Insufficient documentation

## 2021-07-23 DIAGNOSIS — I110 Hypertensive heart disease with heart failure: Secondary | ICD-10-CM | POA: Insufficient documentation

## 2021-07-23 DIAGNOSIS — G629 Polyneuropathy, unspecified: Secondary | ICD-10-CM | POA: Insufficient documentation

## 2021-07-23 DIAGNOSIS — R310 Gross hematuria: Secondary | ICD-10-CM

## 2021-07-23 DIAGNOSIS — D6481 Anemia due to antineoplastic chemotherapy: Secondary | ICD-10-CM

## 2021-07-23 DIAGNOSIS — C50411 Malignant neoplasm of upper-outer quadrant of right female breast: Secondary | ICD-10-CM | POA: Insufficient documentation

## 2021-07-23 DIAGNOSIS — Z5111 Encounter for antineoplastic chemotherapy: Secondary | ICD-10-CM

## 2021-07-23 DIAGNOSIS — T451X5A Adverse effect of antineoplastic and immunosuppressive drugs, initial encounter: Secondary | ICD-10-CM | POA: Insufficient documentation

## 2021-07-23 DIAGNOSIS — Z95828 Presence of other vascular implants and grafts: Secondary | ICD-10-CM

## 2021-07-23 DIAGNOSIS — Z79899 Other long term (current) drug therapy: Secondary | ICD-10-CM | POA: Insufficient documentation

## 2021-07-23 DIAGNOSIS — I509 Heart failure, unspecified: Secondary | ICD-10-CM | POA: Insufficient documentation

## 2021-07-23 LAB — COMPREHENSIVE METABOLIC PANEL
ALT: 19 U/L (ref 0–44)
AST: 21 U/L (ref 15–41)
Albumin: 3.3 g/dL — ABNORMAL LOW (ref 3.5–5.0)
Alkaline Phosphatase: 65 U/L (ref 38–126)
Anion gap: 5 (ref 5–15)
BUN: 12 mg/dL (ref 8–23)
CO2: 25 mmol/L (ref 22–32)
Calcium: 8.2 mg/dL — ABNORMAL LOW (ref 8.9–10.3)
Chloride: 105 mmol/L (ref 98–111)
Creatinine, Ser: 0.77 mg/dL (ref 0.44–1.00)
GFR, Estimated: >60 mL/min (ref >60)
Glucose, Bld: 116 mg/dL — ABNORMAL HIGH (ref 70–99)
Potassium: 3.7 mmol/L (ref 3.5–5.1)
Sodium: 135 mmol/L (ref 135–145)
Total Bilirubin: 0.5 mg/dL (ref 0.3–1.2)
Total Protein: 6.2 g/dL — ABNORMAL LOW (ref 6.5–8.1)

## 2021-07-23 LAB — CBC WITH DIFFERENTIAL/PLATELET
Abs Immature Granulocytes: 0.08 10*3/uL — ABNORMAL HIGH (ref 0.00–0.07)
Basophils Absolute: 0.1 10*3/uL (ref 0.0–0.1)
Basophils Relative: 1 %
Eosinophils Absolute: 0.1 10*3/uL (ref 0.0–0.5)
Eosinophils Relative: 1 %
HCT: 34.3 % — ABNORMAL LOW (ref 36.0–46.0)
Hemoglobin: 11 g/dL — ABNORMAL LOW (ref 12.0–15.0)
Immature Granulocytes: 1 %
Lymphocytes Relative: 17 %
Lymphs Abs: 1.6 10*3/uL (ref 0.7–4.0)
MCH: 30.1 pg (ref 26.0–34.0)
MCHC: 32.1 g/dL (ref 30.0–36.0)
MCV: 94 fL (ref 80.0–100.0)
Monocytes Absolute: 0.7 10*3/uL (ref 0.1–1.0)
Monocytes Relative: 7 %
Neutro Abs: 6.8 10*3/uL (ref 1.7–7.7)
Neutrophils Relative %: 73 %
Platelets: 240 10*3/uL (ref 150–400)
RBC: 3.65 MIL/uL — ABNORMAL LOW (ref 3.87–5.11)
RDW: 14.7 % (ref 11.5–15.5)
WBC: 9.3 10*3/uL (ref 4.0–10.5)
nRBC: 0 % (ref 0.0–0.2)

## 2021-07-23 MED ORDER — HEPARIN SOD (PORK) LOCK FLUSH 100 UNIT/ML IV SOLN
500.0000 [IU] | Freq: Once | INTRAVENOUS | Status: AC
  Administered 2021-07-23: 500 [IU] via INTRAVENOUS
  Filled 2021-07-23: qty 5

## 2021-07-23 MED ORDER — SODIUM CHLORIDE 0.9% FLUSH
10.0000 mL | Freq: Once | INTRAVENOUS | Status: AC
  Administered 2021-07-23: 10 mL via INTRAVENOUS
  Filled 2021-07-23: qty 10

## 2021-07-23 NOTE — Progress Notes (Signed)
°Hematology/Oncology Progress note °[phone] [phone] ° ° °Patient Care Team: °Arnett, Margaret G, FNP as PCP - General (Family Medicine) °Gollan, Timothy J, MD as PCP - Cardiology (Cardiology) °Thomas, Brad, MD (Inactive) °Gollan, Timothy J, MD as Consulting Physician (Cardiology) °Lambert, Sheena M, RN as Oncology Nurse Navigator °Marimar Suber, MD as Consulting Physician (Hematology and Oncology) ° °REFERRING PROVIDER: °Arnett, Margaret G, FNP  °CHIEF COMPLAINTS/REASON FOR VISIT:  °Follow-up for stage I HER2 positive breast cancer ° °HISTORY OF PRESENTING ILLNESS:  ° °Amber Barnes is a  71 y.o.  female with PMH listed below was seen in consultation at the request of  Arnett, Margaret G, FNP  for evaluation of weight loss, family history ovarian cancer. °Patient reports a history of nausea vomiting, early satiety, dysphagia symptoms that started in March.  She has lost significant amount of weight as well. °Patient weighed 340 pounds in February 2021, 309 pounds in May 2021 and currently she weighs 296 pounds today. °Patient has tried Zofran and scopolamine patch which did not relieve her symptoms. °11/05/2019, CT abdomen pelvis showed chronic changes with out acute abnormality to correspond with the patient's symptoms. °Patient has had right upper quadrant ultrasound done on 11/14/2019, which showed hepatic steatosis with no other acute abnormality. °Patient was seen by gastroenterology Dr. Toledo on 11/18/2019.  Patient was recommended to take PPI daily.  Added Carafate.  H. pylori testing came back positive and the patient was started on a course of tetracycline, metronidazole, Pepto-Bismol for H. pylori treatments.  Patient reports that she has been on the treatment for 7 days now.  Nausea is slightly better but still persistent.  She is following up with gastroenterology next week for discussion of EGD. °Currently she is not able to tolerate colonoscopy prep due to the nausea.  Her stool  was tested positive for occult blood. °She also reports night sweating which is a chronic symptom for her. °Patient was last seen on 05/20/2020 for evaluation of weight loss, family history of ovarian cancer patient was referred to establish care with genetic counselor.  Patient eventually declined genetic testing. ° °12/30/2020, patient had a screening mammogram done which showed possible mass and asymmetry in the right breast. °01/08/2021, diagnostic mammogram of the right breast showed 2 adjacent irregular spiculated masses.  Larger 1 was 10 mm and a smaller mass measured 5 mm. °01/15/2021 stereotactic biopsy of the right breast smaller mass showed invasive carcinoma no special type, grade 2, DCIS present, LVI negative. °Ultrasound guided biopsy of the larger mass showed invasive mammary carcinoma,  ° °Patient has appointment with surgery Dr. Rodenberg later this week.  She was referred to establish care with oncology for evaluation.  She was accompanied by daughter. °She has no new complaints. ° °Family history of breast cancer: Denies °Family history of other cancers: Patient has a strong family history of leukemia in her mother, colon cancer in her father at age of 56, ovarian cancer in her sister at age of 54, CLL in her brother ° °02/17/2021, patient is status post lumpectomy and sentinel lymph node biopsy °Pathology showed invasive mammary carcinoma, 2 separate foci, DCIS, negative sentinel lymph node biopsy 0/1, grade 3, or margins negative for invasive carcinoma.  ER -, PR + [1-10%], HER2 +  pT1b(m) pN0  ° °# Strong family history of cancer, Discussed again about genetic testing. patient adamantly declined. ° °#  Intermittent dizziness which per patient is chronic for her due to the vertigo. °#03/23/2021 Mediport by vascular surgeon °#History   of CHF, 03/08/2021, echocardiogram showed LVEF 60-65%. Per patient, she has been seen by Dr. [name] team and was cleared for proceeding with chemotherapy.   03/24/2021  - 03/31/2021 Taxol 80 mg/m2 [name] 04/05/2021 - 04/09/2021 patient was admitted due to neutropenic fever with associated SIRS.  Patient was treated with empiric cefepime and vancomycin until date of discharge when [place] recovered to 1.4.  Post hospitalization, patient also had COVID-19 infection so her follow-up appointment was further postponed for another 2 weeks.  05/10/2021 resumed dose reduce Taxol $RemoveBefo'65mg'EDgggMdCmys$ /m2 [name]  INTERVAL HISTORY Amber Barnes is a 71 y.o. female who has above history reviewed by me today presents for follow up visit for management of right side HER 2 positive breast cancer  #During the interval, patient was admitted from 07/12/2021 - 07/17/2021 due to acute pyelonephritis UVJ obstruction and a right hydro ureteralnephrosis.  Patient was treated with IV antibiotics status post emergent bilateral ureteral stent placement.  Discharged home with a course of oral antibiotics.  Patient says that she still feels weak today.  Ongoing hematuria.   No fever or chills.  Review of Systems  Constitutional:  Positive for fatigue. Negative for appetite change, chills, fever and unexpected weight change.       Patient sits in the wheelchair  HENT:   Negative for hearing loss and voice change.   Respiratory:  Negative for chest tightness and cough.   Cardiovascular:  Negative for chest pain.  Gastrointestinal:  Negative for abdominal distention, abdominal pain, blood in stool, nausea and vomiting.  Endocrine: Negative for hot flashes.  Genitourinary:  Positive for hematuria. Negative for difficulty urinating and frequency.   Musculoskeletal:  Negative for arthralgias.  Skin:  Negative for itching and rash.  Neurological:  Positive for numbness. Negative for dizziness and extremity weakness.  Hematological:  Negative for adenopathy.  Psychiatric/Behavioral:  Negative for confusion.    MEDICAL HISTORY:  Past Medical History:  Diagnosis Date   Achilles tendinitis    Acute  medial meniscal tear    Allergy    Anxiety    Atrial fibrillation ([place])    a. new onset 07/2016; b. CHADS2VASc --> 4 (HTN, female, age, CHF); c. eliquis   CHF (congestive heart failure) (HCC)    Chronic anticoagulation    Apixaban   Chronic lower back pain    Colon polyp    Complication of anesthesia    Emergence delirium; hypoxia   COPD (chronic obstructive pulmonary disease) ([place])    Cystic breast    Depression    Endometriosis    Fibrocystic breast disease    GERD (gastroesophageal reflux disease)    History of stress test    a. 07/2016 MV: EF 55-65%. Small, mild defect  in mid anteroseptal, apical septal, and apical locations. No ischemia-->low risk.   Hyperlipidemia    Hypertension    Insomnia    Lipoma    Malignant neoplasm of upper-outer quadrant of right female breast (HCC)    Stage 1A invasive mammary carcinoma (cT1b or T1c N0); ER (-), PR (+), HER2/neu (+)   Migraine    Mild Mitral regurgitation    a. 07/2016 Echo: EF 55-65%, no rwma, mild MR. Mildly dil LA. Nl RV fx.    OSA (obstructive sleep apnea)    non-compliant with nocturnal PAP therapy   Osteoarthritis    left knee   Osteopenia    Peptic ulcer disease    a. H. pylori   Pneumonia    RLS (restless legs syndrome)  Tendonitis    Thyroid disease    Vitamin D deficiency     SURGICAL HISTORY: Past Surgical History:  Procedure Laterality Date   ABDOMINAL HYSTERECTOMY     ovaries left   ACHILLES TENDON SURGERY     2012,01/2017   BACK SURGERY     fusion lumbar   BREAST BIOPSY Right 01/15/2021   Affirm biopsy-"X" clip-INVASIVE MAMMARY CARCINOMA   BREAST BIOPSY Right 01/15/2021   u/s bx-"venus" clip INVASIVE MAMMARY CARCINOMA   BREAST BIOPSY     BREAST LUMPECTOMY,RADIO FREQ LOCALIZER,AXILLARY SENTINEL LYMPH NODE BIOPSY Right 02/17/2021   Procedure: BREAST LUMPECTOMY,RADIO FREQ LOCALIZER,AXILLARY SENTINEL LYMPH NODE BIOPSY;  Surgeon: Ronny Bacon, MD;  Location: [facility];  Service: General;   Laterality: Right;   CARDIOVERSION N/A 09/07/2016   Procedure: CARDIOVERSION;  Surgeon: Minna Merritts, MD;  Location: [facility];  Service: Cardiovascular;  Laterality: N/A;   CARDIOVERSION N/A 10/19/2016   Procedure: CARDIOVERSION;  Surgeon: Minna Merritts, MD;  Location: [facility];  Service: Cardiovascular;  Laterality: N/A;   CARDIOVERSION N/A 12/14/2016   Procedure: CARDIOVERSION;  Surgeon: Minna Merritts, MD;  Location: [facility];  Service: Cardiovascular;  Laterality: N/A;   COLONOSCOPY     COLONOSCOPY WITH PROPOFOL N/A 05/13/2020   Procedure: COLONOSCOPY WITH PROPOFOL;  Surgeon: Toledo, Benay Pike, MD;  Location: [facility];  Service: Gastroenterology;  Laterality: N/A;  ELIQUIS   CYSTOSCOPY WITH STENT PLACEMENT Bilateral 07/12/2021   Procedure: CYSTOSCOPY WITH STENT PLACEMENT;  Surgeon: Billey Co, MD;  Location: [facility];  Service: Urology;  Laterality: Bilateral;   ESOPHAGOGASTRODUODENOSCOPY     ESOPHAGOGASTRODUODENOSCOPY (EGD) WITH PROPOFOL N/A 05/13/2020   Procedure: ESOPHAGOGASTRODUODENOSCOPY (EGD) WITH PROPOFOL;  Surgeon: Toledo, Benay Pike, MD;  Location: [facility];  Service: Gastroenterology;  Laterality: N/A;   FOOT SURGERY Left    tendon   JOINT REPLACEMENT Right    hip   PORTA CATH INSERTION N/A 03/23/2021   Procedure: PORTA CATH INSERTION;  Surgeon: Katha Cabal, MD;  Location: [facility];  Service: Cardiovascular;  Laterality: N/A;   PORTACATH PLACEMENT N/A 03/23/2021   Procedure: ATTEMPTED INSERTION PORT-A-CATH;  Surgeon: Ronny Bacon, MD;  Location: [facility];  Service: General;  Laterality: N/A;   THYROIDECTOMY     thyroid lobectomy secondary to a benign nodule   TOTAL HIP ARTHROPLASTY Right 08/21/2018   Procedure: TOTAL HIP ARTHROPLASTY ANTERIOR APPROACH-RIGHT CELL SAVER REQUESTED;  Surgeon: [name], MD;  Location: [facility];  Service: Orthopedics;  Laterality: Right;    SOCIAL HISTORY: Social History   Socioeconomic  History   Marital status: Married    Spouse name: jeffrey   Number of children: 1   Years of education: Not on file   Highest education level: High school graduate  Occupational History    Comment: disabled  Tobacco Use   Smoking status: Former    Packs/day: 0.25    Years: 10.00    Pack years: 2.50    Types: Cigarettes    Quit date: 2005    Years since quitting: 18.0   Smokeless tobacco: Never   Tobacco comments:    smoked for 10 years, 1 pack every 2-3 days  Vaping Use   Vaping Use: Never used  Substance and Sexual Activity   Alcohol use: Not Currently    Alcohol/week: 1.0 standard drink    Types: 1 Glasses of wine per week    Comment: rarely   Drug use: No   Sexual activity: Not Currently  Other Topics  Concern   Not on file  Social History Narrative   Moved to [place] from [place], originally from [place].    1 cup of caffeine daily   Right handed          Social Determinants of Health   Financial Resource Strain: Low Risk    Difficulty of Paying Living Expenses: Not very hard  Food Insecurity: No Food Insecurity   Worried About Programme researcher, broadcasting/film/video in the Last Year: Never true   Ran Out of Food in the Last Year: Never true  Transportation Needs: No Transportation Needs   Lack of Transportation (Medical): No   Lack of Transportation (Non-Medical): No  Physical Activity: Unknown   Days of Exercise per Week: 0 days   Minutes of Exercise per Session: Not on file  Stress: Stress Concern Present   Feeling of Stress : Rather much  Social Connections: Moderately Isolated   Frequency of Communication with Friends and Family: Never   Frequency of Social Gatherings with Friends and Family: Once a week   Attends Religious Services: More than 4 times per year   Active Member of [employer] or Organizations: No   Attends Engineer, structural: Never   Marital Status: Married  Catering manager Violence: Not At Risk   Fear of Current or  Ex-Partner: No   Emotionally Abused: No   Physically Abused: No   Sexually Abused: No    FAMILY HISTORY: Family History  Problem Relation Age of Onset   Cancer Sister 59       ovarian   Depression Sister    Cancer Brother        CLL   Cancer Father 52       colon   Heart disease Father    Heart attack Father 81       died from MI   Hyperlipidemia Father    Hypertension Father    Cancer Mother        leukemia   Breast cancer Paternal Aunt        81's   Migraines Neg Hx     ALLERGIES:  is allergic to prednisone, amiodarone, hydrocodone-acetaminophen, oxycodone, tapentadol, tramadol, adhesive [tape], and latex.  MEDICATIONS:  Current Outpatient Medications  Medication Sig Dispense Refill   acetaminophen (TYLENOL) 500 MG tablet Take 500-1,000 mg by mouth every 6 (six) hours as needed for mild pain or moderate pain.     ALPRAZolam (XANAX) 0.25 MG tablet Take 0.25 mg by mouth at bedtime as needed for anxiety or sleep.     azelastine (ASTELIN) 0.1 % nasal spray Place 1-2 sprays into both nostrils daily as needed for rhinitis.     bisoprolol (ZEBETA) 5 MG tablet Hold until followup with outpatient provider due to your low heart rate. 180 tablet 1   busPIRone (BUSPAR) 5 MG tablet TAKE 1 TABLET BY MOUTH THREE TIMES A DAY AS NEEDED 60 tablet 1   Cholecalciferol (VITAMIN D3) 5000 units CAPS Take 5,000 Units by mouth daily.     ciprofloxacin (CIPRO) 500 MG tablet Take 1 tablet (500 mg total) by mouth 2 (two) times daily for 8 days. 16 tablet 0   diltiazem (CARDIZEM CD) 240 MG 24 hr capsule Hold until followup with outpatient provider due to your low heart rate. 30 capsule 2   diphenhydrAMINE (BENADRYL) 25 mg capsule Take 1 capsule (25 mg total) by mouth every 8 (eight) hours as needed for itching or allergies. 30 capsule 0  DULoxetine (CYMBALTA) 30 MG capsule Take 1 capsule (30 mg total) by mouth 2 (two) times daily. Home med.     esomeprazole (NEXIUM) 20 MG capsule Take 20 mg by mouth  daily as needed (GERD symptoms).     furosemide (LASIX) 20 MG tablet Take 1 tablet (20 mg total) by mouth daily. (Patient taking differently: Take 20 mg by mouth daily. (May take additional tablet daily if needed for swelling)) 90 tablet 0   hydrALAZINE (APRESOLINE) 50 MG tablet Take 1 tablet (50 mg total) by mouth 3 (three) times daily. 90 tablet 2   lidocaine-prilocaine (EMLA) cream Apply to affected area once 30 g 3   loperamide (IMODIUM) 2 MG capsule Take 1 capsule (2 mg total) by mouth See admin instructions. Initial: 4 mg, followed by 2 mg after each loose stool; maximum: 16 mg/day 60 capsule 1   magic mouthwash SOLN Take 5 mLs by mouth 2 (two) times daily. 480 mL 1   Multiple Vitamin (MULTIVITAMIN) tablet Take 1 tablet by mouth daily.     nystatin (MYCOSTATIN/NYSTOP) powder Apply 1 application topically 3 (three) times daily. 15 g 1   ondansetron (ZOFRAN) 8 MG tablet Take 1 tablet (8 mg total) by mouth every 8 (eight) hours as needed for nausea or vomiting. 60 tablet 0   prochlorperazine (COMPAZINE) 10 MG tablet Take 1 tablet (10 mg total) by mouth every 6 (six) hours as needed (Nausea or vomiting). 30 tablet 1   promethazine (PHENERGAN) 25 MG tablet Take 1 tablet (25 mg total) by mouth every 6 (six) hours as needed for nausea or vomiting. 60 tablet 0   ranolazine (RANEXA) 500 MG 12 hr tablet TAKE 1 TABLET BY MOUTH TWICE A DAY 180 tablet 0   rizatriptan (MAXALT) 10 MG tablet TAKE 1 TABLET (10 MG TOTAL) BY MOUTH AS NEEDED FOR MIGRAINE. MAY REPEAT IN 2 HOURS IF NEEDED 10 tablet 0   rOPINIRole (REQUIP) 2 MG tablet Take 1 tablet (2 mg total) by mouth at bedtime. 90 tablet 1   rosuvastatin (CRESTOR) 20 MG tablet TAKE 1 TABLET BY MOUTH EVERY DAY 90 tablet 0   apixaban (ELIQUIS) 5 MG TABS tablet Hold until followup with outpatient provider due to bloody urine. (Patient not taking: Reported on 07/23/2021) 180 tablet 1   No current facility-administered medications for this visit.     PHYSICAL  EXAMINATION: ECOG PERFORMANCE STATUS: 2 - Symptomatic, <50% confined to bed Vitals:   07/23/21 0830  BP: 101/68  Pulse: 63  Resp: 18  Temp: (!) 97 F (36.1 C)    Filed Weights   07/23/21 0830  Weight: 265 lb 3.2 oz (120.3 kg)     Physical Exam Constitutional:      General: She is not in acute distress.    Appearance: She is obese.  HENT:     Head: Normocephalic and atraumatic.  Eyes:     General: No scleral icterus. Cardiovascular:     Rate and Rhythm: Normal rate and regular rhythm.     Heart sounds: Normal heart sounds.  Pulmonary:     Effort: Pulmonary effort is normal. No respiratory distress.     Breath sounds: No wheezing.  Abdominal:     General: Bowel sounds are normal. There is no distension.     Palpations: Abdomen is soft.  Musculoskeletal:        General: No deformity. Normal range of motion.     Cervical back: Normal range of motion and neck supple.  Skin:  General: Skin is warm and dry.     Findings: No erythema or rash.  Neurological:     Mental Status: She is alert and oriented to person, place, and time. Mental status is at baseline.     Cranial Nerves: No cranial nerve deficit.     Coordination: Coordination normal.  Psychiatric:        Mood and Affect: Mood normal.    . LABORATORY DATA:  I have reviewed the data as listed Lab Results  Component Value Date   WBC 9.3 07/23/2021   HGB 11.0 (L) 07/23/2021   HCT 34.3 (L) 07/23/2021   MCV 94.0 07/23/2021   PLT 240 07/23/2021   Recent Labs    07/02/21 1256 07/03/21 0711 07/12/21 0309 07/13/21 0412 07/16/21 0452 07/17/21 0619 07/23/21 0808  NA 137   < > 141   < > 137 137 135  K 3.7   < > 4.2   < > 3.6 3.6 3.7  CL 108   < > 109   < > 104 100 105  CO2 24   < > 23   < > $R'29 29 25  'Cq$ GLUCOSE 104*   < > 135*   < > 95 91 116*  BUN 19   < > 12   < > $R'15 9 12  'wr$ CREATININE 0.77   < > 1.02*   < > 0.68 0.63 0.77  CALCIUM 8.7*   < > 8.8*   < > 8.0* 8.1* 8.2*  GFRNONAA >60   < > 60*   < > >60 >60  >60  PROT 6.8  --  7.1  --   --   --  6.2*  ALBUMIN 3.8  --  3.7  --   --   --  3.3*  AST 20  --  29  --   --   --  21  ALT 15  --  20  --   --   --  19  ALKPHOS 59  --  91  --   --   --  65  BILITOT 0.8  --  0.9  --   --   --  0.5  BILIDIR 0.1  --   --   --   --   --   --   IBILI 0.7  --   --   --   --   --   --    < > = values in this interval not displayed.    Iron/TIBC/Ferritin/ %Sat    Component Value Date/Time   IRON 72 03/31/2021 0856   TIBC 364 03/31/2021 0856   FERRITIN 291 03/31/2021 0856   IRONPCTSAT 20 03/31/2021 0856      RADIOGRAPHIC STUDIES: I have personally reviewed the radiological images as listed and agreed with the findings in the report. DG Chest 2 View  Result Date: 07/02/2021 CLINICAL DATA:  Chest pain and abdominal pain. EXAM: CHEST - 2 VIEW COMPARISON:  04/05/2021 and CT chest 08/23/2018. FINDINGS: Trachea is midline. Heart is enlarged. Left IJ power port tip is in the SVC. Lungs are clear. No pleural fluid. IMPRESSION: No acute findings. Electronically Signed   By: Lorin Picket M.D.   On: 07/02/2021 13:22   CT Angio Chest PE W/Cm &/Or Wo Cm  Result Date: 07/02/2021 CLINICAL DATA:  Upper abdominal and chest pain. High probability for PE. EXAM: CT ANGIOGRAPHY CHEST WITH CONTRAST TECHNIQUE: Multidetector CT imaging of the chest was performed using the standard protocol  during bolus administration of intravenous contrast. Multiplanar CT image reconstructions and MIPs were obtained to evaluate the vascular anatomy. CONTRAST:  158mL OMNIPAQUE IOHEXOL 350 MG/ML SOLN COMPARISON:  None. FINDINGS: Cardiovascular: Satisfactory opacification of the pulmonary arteries to the segmental level. No evidence of pulmonary embolism. Normal heart size. No pericardial effusion. The main pulmonary artery is mildly enlarged at 3.2 cm. Trace atherosclerotic calcifications visualized along the left anterior descending coronary artery. Left IJ approach port catheter visualized. The  tip of the catheter terminates at the cavoatrial junction. Mediastinum/Nodes: No enlarged mediastinal, hilar, or axillary lymph nodes. Thyroid gland, trachea, and esophagus demonstrate no significant findings. Lungs/Pleura: Lungs are clear. No pleural effusion or pneumothorax. Upper Abdomen: No acute abnormality. Musculoskeletal: No chest wall abnormality. No acute or significant osseous findings. Review of the MIP images confirms the above findings. IMPRESSION: 1. No evidence of pulmonary embolus, pneumonia or other acute cardiopulmonary process. 2. Mild enlargement of the main pulmonary artery at 3.2 cm. Findings can be seen in the setting of pulmonary arterial hypertension. 3. Trace atherosclerotic calcifications along the thoracic aorta and left anterior descending coronary artery. Aortic Atherosclerosis (ICD10-I70.0). Electronically Signed   By: Jacqulynn Cadet M.D.   On: 07/02/2021 15:58   CT ABDOMEN PELVIS W CONTRAST  Result Date: 07/12/2021 CLINICAL DATA:  Flank pain with kidney stone suspected EXAM: CT ABDOMEN AND PELVIS WITH CONTRAST TECHNIQUE: Multidetector CT imaging of the abdomen and pelvis was performed using the standard protocol following bolus administration of intravenous contrast. CONTRAST:  149mL OMNIPAQUE IOHEXOL 300 MG/ML  SOLN COMPARISON:  07/02/2021 FINDINGS: Lower chest:  No acute finding. Hepatobiliary: No focal liver abnormality.No evidence of biliary obstruction or stone. Pancreas: Generalized atrophy Spleen: Unremarkable. Adrenals/Urinary Tract: Negative adrenals. Right hydroureteronephrosis with perinephric stranding presumably to a 3 mm right UVJ calculus suggested on coronal reformats, but the pelvis was obscured by artifact from a right hip prosthesis. Layering calcification in the left renal pelvis and present in the bilateral calices which are unchanged. Left perinephric stranding is improved. Low densities in the left renal cortex are unchanged. Collapsed bladder which is  largely obscured by streak artifact. Stomach/Bowel:  No obstruction. Appendectomy. Vascular/Lymphatic: No acute vascular abnormality. Atheromatous calcification. No mass or adenopathy. Reproductive:Hysterectomy Other: No ascites or pneumoperitoneum. Musculoskeletal: No acute abnormalities. Generalized lumbar spine degeneration. Total right hip arthroplasty. IMPRESSION: 1. Right hydroureteronephrosis likely due to a 3 mm UVJ calculus, although certainty is limited by extensive artifact from a right hip prosthesis. 2. Bilateral nephrolithiasis. Electronically Signed   By: Jorje Guild M.D.   On: 07/12/2021 05:26   CT Abdomen Pelvis W Contrast  Result Date: 07/02/2021 CLINICAL DATA:  Upper abdominal and chest pain nausea EXAM: CT ABDOMEN AND PELVIS WITH CONTRAST TECHNIQUE: Multidetector CT imaging of the abdomen and pelvis was performed using the standard protocol following bolus administration of intravenous contrast. CONTRAST:  173mL OMNIPAQUE IOHEXOL 350 MG/ML SOLN COMPARISON:  CT 04/05/2021, 11/05/2019 FINDINGS: Lower chest: Lung bases demonstrate no acute consolidation or effusion. Mild cardiomegaly. Hepatobiliary: No calcified gallstone.  No biliary dilatation. Pancreas: Unremarkable. No pancreatic ductal dilatation or surrounding inflammatory changes. Spleen: Normal in size without focal abnormality. Adrenals/Urinary Tract: Adrenal glands are normal. Previously noted dominant cyst midpole left kidney is decreased with small residual. New left perinephric stranding and fluid. Additional hypodense renal lesions grossly unchanged. Mild left hydronephrosis without obstructing stone. Probable punctate kidney stones. Mild stranding about the left ureter. The bladder is unremarkable. Stomach/Bowel: The stomach is nonenlarged. No dilated small bowel. No acute  bowel wall thickening. Mild diverticular disease of the left colon. Vascular/Lymphatic: Moderate aortic atherosclerosis. No aneurysm. No suspicious nodes.  Reproductive: Status post hysterectomy. No adnexal masses. Other: Negative for pelvic effusion or free air. Musculoskeletal: No acute or suspicious osseous abnormality. Right hip replacement with artifact. IMPRESSION: 1. Mild left hydronephrosis with new left perinephric fat stranding and small amount of perinephric fluid. Mild soft tissue stranding about the left ureter. Findings could be secondary to recently passed kidney stone versus ascending urinary tract infection. A dominant cyst at the midpole left kidney has significantly decreased in size and rupture of the cyst could be contributing to some of the left perinephric fluid. 2. Negative for bowel obstruction or bowel wall thickening 3. Mild cardiomegaly. 4. Diverticular disease of the left colon without acute wall thickening. 5. Nonobstructing punctate kidney stones. Electronically Signed   By: Donavan Foil M.D.   On: 07/02/2021 16:17   DG OR UROLOGY CYSTO IMAGE ([facility] ONLY)  Result Date: 07/12/2021 There is no interpretation for this exam.  This order is for images obtained during a surgical procedure.  Please See "Surgeries" Tab for more information regarding the procedure.      ASSESSMENT & PLAN:  1. HER2-positive carcinoma of breast ([place])   2. Encounter for antineoplastic chemotherapy   3. Anemia due to antineoplastic chemotherapy   4. Gross hematuria    Cancer Staging  Malignant neoplasm of upper-outer quadrant of right female breast [facility]) Staging form: Breast, AJCC 8th Edition - Pathologic stage from 03/03/2021: Stage IA (pT1b, pN0, cM0, G3, ER-, PR+, HER2+) - Signed by [name], MD on 03/03/2021  #Right breast invasive carcinoma pT1b pN0 ER negative, PR weakly positive HER2 positive positive, HER 2 positive.  Patient does not tolerate adjuvant chemotherapy with Taxol and trastuzumab even with Taxol dose reduction. Has had only 2 treatments done since September 2022.  And has had multiple hospitalization afterwards. I discussed with  the patient about admitting adjuvant Taxol treatments. Repeat echocardiogram.  If stable EF, could consider maintenance of trastuzumab only, or complete or adjuvant chemotherapy and continue surveillance  #I will refer patient establish care with radiation oncology for adjuvant radiation.  # chemotherapy induced neutropenia, status post Zarxio #Watery eyes and runny nose, likely allergy symptoms incidentally started after treatments.  Recommend patient to take allergy medication.  If symptoms worse, recommend patient to have influenza/COVID-19 tested.  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  #Chemotherapy-induced anemia, hemoglobin has improved comparing to levels.  Recent admission. # she declined genetic testing #General body aches/arthritic pain.  Tylenol 3 PRN  Supportive care measures are necessary for patient well-being and will be provided as necessary. We spent sufficient time to discuss many aspect of care, questions were answered to patient's satisfaction.   All questions were answered. The patient knows to call the clinic with any problems questions or concerns.  cc [name], FNP  Follow-up to be determined.  Patient will get adjuvant radiation.  [name], MD, PhD 07/23/2021

## 2021-07-23 NOTE — Progress Notes (Signed)
Pt here for follow up. Pt reports that she is currently on antibiotics due to kidney stones. Was in the hospital at the end of Dec and had stents placed. Scheduled for cystoscopy/ ureteroscopy.

## 2021-07-25 ENCOUNTER — Other Ambulatory Visit: Payer: Self-pay | Admitting: Family

## 2021-07-25 DIAGNOSIS — G2581 Restless legs syndrome: Secondary | ICD-10-CM

## 2021-07-27 ENCOUNTER — Encounter
Admission: RE | Admit: 2021-07-27 | Discharge: 2021-07-27 | Disposition: A | Discharge status: Home / Self Care | Payer: Medicare Other | Source: Ambulatory Visit | Attending: Urology | Admitting: Urology

## 2021-07-27 ENCOUNTER — Other Ambulatory Visit: Payer: Self-pay

## 2021-07-27 NOTE — Patient Instructions (Addendum)
Your procedure is scheduled on: Friday, January 20 Report to the Registration Desk on the 1st floor of the [facility]. To find out your arrival time, please call [phone] between 1PM - 3PM on: Thursday, January 19  REMEMBER: Instructions that are not followed completely may result in serious medical risk, up to and including death; or upon the discretion of your surgeon and anesthesiologist your surgery may need to be rescheduled.  Do not eat or drink after midnight the night before surgery.  No gum chewing, lozengers or hard candies.  TAKE THESE MEDICATIONS THE MORNING OF SURGERY WITH A SIP OF WATER:  Buspirone if needed for anxiety Duloxetine (Cymbalta) Esomeprazole (Nexium) Hydralazine Ranolazine (Ranexa) Rosuvastatin (Crestor)  Follow recommendations from Cardiologist regarding stopping Eliquis. Eliquis is already on hold. Follow recommendations given by Dr. [name] following your surgery.  One week prior to surgery: Stop Anti-inflammatories (NSAIDS) such as Advil, Aleve, Ibuprofen, Motrin, Naproxen, Naprosyn and Aspirin based products such as Excedrin, Goodys Powder, BC Powder. Stop ANY OVER THE COUNTER supplements until after surgery. You may however, continue to take Tylenol if needed for pain up until the day of surgery.  No Alcohol for 24 hours before or after surgery.  No Smoking including e-cigarettes for 24 hours prior to surgery.  No chewable tobacco products for at least 6 hours prior to surgery.  No nicotine patches on the day of surgery.  Do not use any "recreational" drugs for at least a week prior to your surgery.  Please be advised that the combination of cocaine and anesthesia may have negative outcomes, up to and including death. If you test positive for cocaine, your surgery will be cancelled.  On the morning of surgery brush your teeth with toothpaste and water, you may rinse your mouth with mouthwash if you wish. Do not swallow any toothpaste or  mouthwash.  Do not wear jewelry, make-up, hairpins, clips or nail polish.  Do not wear lotions, powders, or perfumes.   Do not shave body from the neck down 48 hours prior to surgery just in case you cut yourself which could leave a site for infection.   Contact lenses, hearing aids and dentures may not be worn into surgery.  Do not bring valuables to the hospital. [facility] is not responsible for any missing/lost belongings or valuables.   Notify your doctor if there is any change in your medical condition (cold, fever, infection).  Wear comfortable clothing (specific to your surgery type) to the hospital.  After surgery, you can help prevent lung complications by doing breathing exercises.  Take deep breaths and cough every 1-2 hours. Your doctor may order a device called an Incentive Spirometer to help you take deep breaths.  If you are being discharged the day of surgery, you will not be allowed to drive home. You will need a responsible adult (18 years or older) to drive you home and stay with you that night.   If you are taking public transportation, you will need to have a responsible adult (18 years or older) with you. Please confirm with your physician that it is acceptable to use public transportation.   Please call the [place] [facility]. at [phone] if you have any questions about these instructions.  Surgery Visitation Policy:  Patients undergoing a surgery or procedure may have one family member or support person with them as long as that person is not COVID-19 positive or experiencing its symptoms.  That person may remain in the waiting  area during the procedure and may rotate out with other people.

## 2021-07-29 ENCOUNTER — Encounter: Payer: Self-pay | Admitting: *Deleted

## 2021-07-29 ENCOUNTER — Other Ambulatory Visit: Payer: Self-pay

## 2021-07-29 ENCOUNTER — Ambulatory Visit
Admission: RE | Admit: 2021-07-29 | Discharge: 2021-07-29 | Disposition: A | Discharge status: Home / Self Care | Payer: Medicare Other | Source: Ambulatory Visit | Attending: Oncology | Admitting: Oncology

## 2021-07-29 DIAGNOSIS — I110 Hypertensive heart disease with heart failure: Secondary | ICD-10-CM | POA: Diagnosis not present

## 2021-07-29 DIAGNOSIS — G4730 Sleep apnea, unspecified: Secondary | ICD-10-CM | POA: Insufficient documentation

## 2021-07-29 DIAGNOSIS — Z08 Encounter for follow-up examination after completed treatment for malignant neoplasm: Secondary | ICD-10-CM | POA: Insufficient documentation

## 2021-07-29 DIAGNOSIS — I3481 Nonrheumatic mitral (valve) annulus calcification: Secondary | ICD-10-CM | POA: Diagnosis not present

## 2021-07-29 DIAGNOSIS — E785 Hyperlipidemia, unspecified: Secondary | ICD-10-CM | POA: Insufficient documentation

## 2021-07-29 DIAGNOSIS — I509 Heart failure, unspecified: Secondary | ICD-10-CM | POA: Diagnosis not present

## 2021-07-29 DIAGNOSIS — Z0189 Encounter for other specified special examinations: Secondary | ICD-10-CM | POA: Diagnosis not present

## 2021-07-29 DIAGNOSIS — Z9221 Personal history of antineoplastic chemotherapy: Secondary | ICD-10-CM | POA: Insufficient documentation

## 2021-07-29 DIAGNOSIS — C50919 Malignant neoplasm of unspecified site of unspecified female breast: Secondary | ICD-10-CM | POA: Diagnosis not present

## 2021-07-29 LAB — ECHOCARDIOGRAM COMPLETE
AR max vel: 2.01 cm2
AV Area VTI: 1.84 cm2
AV Area mean vel: 1.9 cm2
AV Mean grad: 5 mmHg
AV Peak grad: 8.5 mmHg
Ao pk vel: 1.46 m/s
Area-P 1/2: 2.62 cm2
MV VTI: 2.04 cm2
S' Lateral: 2.97 cm

## 2021-07-29 MED ORDER — CHLORHEXIDINE GLUCONATE 0.12 % MT SOLN: 15.0000 mL | Freq: Once | OROMUCOSAL | Status: AC

## 2021-07-29 MED ORDER — ORAL CARE MOUTH RINSE: 15.0000 mL | Freq: Once | OROMUCOSAL | Status: AC

## 2021-07-29 MED ORDER — LACTATED RINGERS IV SOLN: INTRAVENOUS | Status: DC

## 2021-07-29 MED ORDER — CEFAZOLIN SODIUM-DEXTROSE 2-4 GM/100ML-% IV SOLN
2.0000 g | INTRAVENOUS | Status: AC
  Administered 2021-07-30: 2 g via INTRAVENOUS

## 2021-07-29 NOTE — Progress Notes (Signed)
*  PRELIMINARY RESULTS* Echocardiogram 2D Echocardiogram has been performed.  Amber Barnes 07/29/2021, 9:55 AM

## 2021-07-30 ENCOUNTER — Ambulatory Visit: Payer: Medicare Other

## 2021-07-30 ENCOUNTER — Ambulatory Visit
Admission: RE | Admit: 2021-07-30 | Discharge: 2021-07-30 | Disposition: A | Discharge status: Home / Self Care | Payer: Medicare Other | Attending: Urology | Admitting: Urology

## 2021-07-30 ENCOUNTER — Ambulatory Visit: Payer: Medicare Other | Admitting: Urgent Care

## 2021-07-30 ENCOUNTER — Other Ambulatory Visit: Payer: Self-pay

## 2021-07-30 ENCOUNTER — Encounter: Payer: Self-pay | Admitting: Urology

## 2021-07-30 ENCOUNTER — Encounter
Admission: RE | Disposition: A | Discharge status: Home / Self Care | Payer: Self-pay | Source: Home / Self Care | Attending: Urology

## 2021-07-30 DIAGNOSIS — F32A Depression, unspecified: Secondary | ICD-10-CM | POA: Insufficient documentation

## 2021-07-30 DIAGNOSIS — Z7901 Long term (current) use of anticoagulants: Secondary | ICD-10-CM | POA: Diagnosis not present

## 2021-07-30 DIAGNOSIS — F419 Anxiety disorder, unspecified: Secondary | ICD-10-CM | POA: Diagnosis not present

## 2021-07-30 DIAGNOSIS — N1330 Unspecified hydronephrosis: Secondary | ICD-10-CM

## 2021-07-30 DIAGNOSIS — I110 Hypertensive heart disease with heart failure: Secondary | ICD-10-CM | POA: Insufficient documentation

## 2021-07-30 DIAGNOSIS — Z87891 Personal history of nicotine dependence: Secondary | ICD-10-CM | POA: Diagnosis not present

## 2021-07-30 DIAGNOSIS — C50911 Malignant neoplasm of unspecified site of right female breast: Secondary | ICD-10-CM | POA: Insufficient documentation

## 2021-07-30 DIAGNOSIS — Z6841 Body Mass Index (BMI) 40.0 and over, adult: Secondary | ICD-10-CM | POA: Diagnosis not present

## 2021-07-30 DIAGNOSIS — N201 Calculus of ureter: Secondary | ICD-10-CM | POA: Diagnosis not present

## 2021-07-30 DIAGNOSIS — N132 Hydronephrosis with renal and ureteral calculous obstruction: Secondary | ICD-10-CM | POA: Diagnosis not present

## 2021-07-30 DIAGNOSIS — N202 Calculus of kidney with calculus of ureter: Secondary | ICD-10-CM | POA: Diagnosis not present

## 2021-07-30 DIAGNOSIS — I509 Heart failure, unspecified: Secondary | ICD-10-CM | POA: Insufficient documentation

## 2021-07-30 DIAGNOSIS — K219 Gastro-esophageal reflux disease without esophagitis: Secondary | ICD-10-CM | POA: Diagnosis not present

## 2021-07-30 DIAGNOSIS — N200 Calculus of kidney: Secondary | ICD-10-CM

## 2021-07-30 DIAGNOSIS — E785 Hyperlipidemia, unspecified: Secondary | ICD-10-CM | POA: Insufficient documentation

## 2021-07-30 DIAGNOSIS — J449 Chronic obstructive pulmonary disease, unspecified: Secondary | ICD-10-CM | POA: Diagnosis not present

## 2021-07-30 HISTORY — PX: CYSTOSCOPY/URETEROSCOPY/HOLMIUM LASER/STENT PLACEMENT: SHX6546

## 2021-07-30 SURGERY — CYSTOSCOPY/URETEROSCOPY/HOLMIUM LASER/STENT PLACEMENT
Anesthesia: General | Site: Ureter | Laterality: Bilateral

## 2021-07-30 MED ORDER — ONDANSETRON HCL 4 MG/2ML IJ SOLN
INTRAMUSCULAR | Status: DC | PRN
  Administered 2021-07-30: 4 mg via INTRAVENOUS

## 2021-07-30 MED ORDER — ROCURONIUM BROMIDE 100 MG/10ML IV SOLN
INTRAVENOUS | Status: DC | PRN
  Administered 2021-07-30: 50 mg via INTRAVENOUS

## 2021-07-30 MED ORDER — ROCURONIUM BROMIDE 10 MG/ML (PF) SYRINGE
PREFILLED_SYRINGE | INTRAVENOUS | Status: AC
  Filled 2021-07-30: qty 10

## 2021-07-30 MED ORDER — PHENYLEPHRINE HCL (PRESSORS) 10 MG/ML IV SOLN
INTRAVENOUS | Status: DC | PRN
  Administered 2021-07-30 (×4): 160 ug via INTRAVENOUS
  Administered 2021-07-30: 80 ug via INTRAVENOUS

## 2021-07-30 MED ORDER — FENTANYL CITRATE (PF) 100 MCG/2ML IJ SOLN: 25.0000 ug | INTRAMUSCULAR | Status: DC | PRN

## 2021-07-30 MED ORDER — ALBUTEROL SULFATE HFA 108 (90 BASE) MCG/ACT IN AERS
INHALATION_SPRAY | RESPIRATORY_TRACT | Status: DC | PRN
  Administered 2021-07-30: 3 via RESPIRATORY_TRACT

## 2021-07-30 MED ORDER — SODIUM CHLORIDE 0.9 % IR SOLN
Status: DC | PRN
  Administered 2021-07-30: 1 via INTRAVESICAL

## 2021-07-30 MED ORDER — VASOPRESSIN 20 UNIT/ML IV SOLN
INTRAVENOUS | Status: DC | PRN
  Administered 2021-07-30: 1 [IU] via INTRAVENOUS

## 2021-07-30 MED ORDER — ONDANSETRON HCL 4 MG/2ML IJ SOLN: 4.0000 mg | Freq: Once | INTRAMUSCULAR | Status: DC | PRN

## 2021-07-30 MED ORDER — PROPOFOL 10 MG/ML IV BOLUS
INTRAVENOUS | Status: DC | PRN
  Administered 2021-07-30: 150 mg via INTRAVENOUS

## 2021-07-30 MED ORDER — SUGAMMADEX SODIUM 200 MG/2ML IV SOLN
INTRAVENOUS | Status: DC | PRN
  Administered 2021-07-30: 200 mg via INTRAVENOUS

## 2021-07-30 MED ORDER — ONDANSETRON HCL 4 MG/2ML IJ SOLN
INTRAMUSCULAR | Status: AC
  Filled 2021-07-30: qty 2

## 2021-07-30 MED ORDER — IOHEXOL 180 MG/ML  SOLN
INTRAMUSCULAR | Status: DC | PRN
  Administered 2021-07-30: 10 mL

## 2021-07-30 MED ORDER — LIDOCAINE HCL (PF) 2 % IJ SOLN
INTRAMUSCULAR | Status: AC
  Filled 2021-07-30: qty 5

## 2021-07-30 MED ORDER — CEFAZOLIN SODIUM-DEXTROSE 2-4 GM/100ML-% IV SOLN
INTRAVENOUS | Status: AC
  Filled 2021-07-30: qty 100

## 2021-07-30 MED ORDER — DEXAMETHASONE SODIUM PHOSPHATE 10 MG/ML IJ SOLN
INTRAMUSCULAR | Status: AC
  Filled 2021-07-30: qty 1

## 2021-07-30 MED ORDER — PROPOFOL 10 MG/ML IV BOLUS
INTRAVENOUS | Status: AC
  Filled 2021-07-30: qty 20

## 2021-07-30 MED ORDER — FENTANYL CITRATE (PF) 100 MCG/2ML IJ SOLN
INTRAMUSCULAR | Status: DC | PRN
Start: 2021-07-30 — End: 2021-07-30
  Administered 2021-07-30 (×2): 25 ug via INTRAVENOUS
  Administered 2021-07-30: 50 ug via INTRAVENOUS

## 2021-07-30 MED ORDER — FENTANYL CITRATE (PF) 100 MCG/2ML IJ SOLN
INTRAMUSCULAR | Status: AC
  Filled 2021-07-30: qty 2

## 2021-07-30 MED ORDER — LIDOCAINE HCL (CARDIAC) PF 100 MG/5ML IV SOSY
PREFILLED_SYRINGE | INTRAVENOUS | Status: DC | PRN
  Administered 2021-07-30: 80 mg via INTRAVENOUS

## 2021-07-30 MED ORDER — CHLORHEXIDINE GLUCONATE 0.12 % MT SOLN
OROMUCOSAL | Status: AC
  Administered 2021-07-30: 15 mL via OROMUCOSAL
  Filled 2021-07-30: qty 15

## 2021-07-30 SURGICAL SUPPLY — 33 items
ADH LQ OCL WTPRF AMP STRL LF (MISCELLANEOUS)
ADHESIVE MASTISOL STRL (MISCELLANEOUS) IMPLANT
BAG DRAIN CYSTO-URO LG1000N (MISCELLANEOUS) ×2 IMPLANT
BRUSH SCRUB EZ 1% IODOPHOR (MISCELLANEOUS) ×2 IMPLANT
CATH URET FLEX-TIP 2 LUMEN 10F (CATHETERS) IMPLANT
CATH URETL OPEN 5X70 (CATHETERS) IMPLANT
CNTNR SPEC 2.5X3XGRAD LEK (MISCELLANEOUS)
CONT SPEC 4OZ STER OR WHT (MISCELLANEOUS)
CONT SPEC 4OZ STRL OR WHT (MISCELLANEOUS)
CONTAINER SPEC 2.5X3XGRAD LEK (MISCELLANEOUS) IMPLANT
DRAPE UTILITY 15X26 TOWEL STRL (DRAPES) ×2 IMPLANT
DRSG TEGADERM 2-3/8X2-3/4 SM (GAUZE/BANDAGES/DRESSINGS) IMPLANT
GAUZE 4X4 16PLY ~~LOC~~+RFID DBL (SPONGE) ×4 IMPLANT
GLOVE SURG UNDER POLY LF SZ7.5 (GLOVE) ×2 IMPLANT
GOWN STRL REUS W/ TWL LRG LVL3 (GOWN DISPOSABLE) ×1 IMPLANT
GOWN STRL REUS W/ TWL XL LVL3 (GOWN DISPOSABLE) ×1 IMPLANT
GOWN STRL REUS W/TWL LRG LVL3 (GOWN DISPOSABLE) ×2
GOWN STRL REUS W/TWL XL LVL3 (GOWN DISPOSABLE) ×2
GUIDEWIRE STR DUAL SENSOR (WIRE) ×2 IMPLANT
INFUSOR MANOMETER BAG 3000ML (MISCELLANEOUS) ×2 IMPLANT
IV NS IRRIG 3000ML ARTHROMATIC (IV SOLUTION) ×2 IMPLANT
KIT TURNOVER CYSTO (KITS) ×2 IMPLANT
PACK CYSTO AR (MISCELLANEOUS) ×2 IMPLANT
SET CYSTO W/LG BORE CLAMP LF (SET/KITS/TRAYS/PACK) ×2 IMPLANT
SHEATH URETERAL 12FRX35CM (MISCELLANEOUS) IMPLANT
STENT URET 6FRX24 CONTOUR (STENTS) IMPLANT
STENT URET 6FRX26 CONTOUR (STENTS) IMPLANT
SURGILUBE 2OZ TUBE FLIPTOP (MISCELLANEOUS) ×2 IMPLANT
SYR 10ML LL (SYRINGE) ×2 IMPLANT
TRACTIP FLEXIVA PULSE ID 200 (Laser) ×1 IMPLANT
VALVE UROSEAL ADJ ENDO (VALVE) ×1 IMPLANT
WATER STERILE IRR 1000ML POUR (IV SOLUTION) ×2 IMPLANT
WATER STERILE IRR 500ML POUR (IV SOLUTION) ×2 IMPLANT

## 2021-07-30 NOTE — Op Note (Signed)
Date of procedure: 07/30/21  Preoperative diagnosis:  Right ureteral stone Right renal stones Left renal stones  Postoperative diagnosis:  Right renal stones Left renal stones  Procedure: Cystoscopy, bilateral retrograde pyelograms with intraoperative interpretation Right ureteroscopy, laser lithotripsy of renal stones Left ureteroscopy, laser lithotripsy of renal stones  Surgeon: Nickolas Madrid, MD  Anesthesia: General  Complications: None  Intraoperative findings:  Normal cystoscopy No evidence of right ureteral stones, multiple right renal stones dusted No evidence of left ureteral stones, multiple small left renal stones dusted Contrast drained bilaterally, ureters widely patent, no stents placed Subtle high insertion of UPJ bilaterally, but ureteroscope passed easily into kidney  EBL: Minimal  Specimens: None  Drains: None  Indication: Amber Barnes is a 71 y.o. patient who previously presented with right distal ureteral stone, right flank pain, and sepsis from urinary source.  She underwent urgent right ureteral stent placement and intraoperatively there was also poor drainage from the left kidney and bilateral ureteral stents were placed.  After reviewing the management options for treatment, they elected to proceed with the above surgical procedure(s). We have discussed the potential benefits and risks of the procedure, side effects of the proposed treatment, the likelihood of the patient achieving the goals of the procedure, and any potential problems that might occur during the procedure or recuperation. Informed consent has been obtained.  Description of procedure:  The patient was taken to the operating room and general anesthesia was induced. SCDs were placed for DVT prophylaxis. The patient was placed in the dorsal lithotomy position, prepped and draped in the usual sterile fashion, and preoperative antibiotics were administered. A preoperative time-out was  performed.   A 21 French rigid cystoscope was used to intubate the urethra and thorough cystoscopy was performed.  The bladder was grossly normal throughout.  The right ureteral stent was grasped and pulled to the meatus, and a sensor wire passed up to the kidney under fluoroscopic vision, and the old stent removed.  A semirigid ureteroscope was advanced along the wire into the right ureteral orifice and thorough ureteroscopy showed no stones or abnormalities.  A single channel digital flexible ureteroscope then advanced easily over the wire up into the kidney.  There was a subtle high insertion of the UPJ, but the ureteroscope passed easily into the kidney.  Thorough pyeloscopy showed multiple 3 to 5 mm stones, and these were all fragmented to dust with a 200 m laser fiber on settings of 0.5 J and 80 Hz.  The stones were very soft.  Thorough pyeloscopy revealed no other stone fragments.  A retrograde pyelogram performed from the proximal ureter showed no extravasation or filling defects.  Careful pullback ureteroscopy showed no ureteral injury or stone fragments.  I then reinserted the rigid cystoscope and the left ureteral stent was grasped and pulled to the meatus, and a sensor wire was passed up to the kidney under fluoroscopic vision, and the old stent removed.  A single channel digital flexible ureteroscope advanced easily over the wire up into the kidney.  Thorough pyeloscopy revealed a few small stones in the upper and mid pole and these were fragmented on previously mentioned settings.  Thorough pyeloscopy revealed no other significant stone fragments.  Again, there is a subtle high insertion of the UPJ, but the ureteroscope passed easily in and out of the kidney.  A retrograde pyelogram from the proximal ureter showed no extravasation or filling defects.  Careful pullback ureteroscopy showed no ureteral injury or stone fragments.  Contrast appeared  to drained briskly bilaterally, and stents were  not replaced.  The bladder was drained and this concluded our procedure.    Disposition: Stable to PACU  Plan: Follow-up in clinic in 6 months with KUB prior  Nickolas Madrid, MD

## 2021-07-30 NOTE — Anesthesia Postprocedure Evaluation (Signed)
Anesthesia Post Note  Patient: Shardae Kleinman  Procedure(s) Performed: CYSTOSCOPY/URETEROSCOPY/HOLMIUM LASER/BILATERAL STENT REMOVAL (Bilateral: Ureter)  Patient location during evaluation: PACU Anesthesia Type: General Level of consciousness: awake and alert, oriented and patient cooperative Pain management: pain level controlled Vital Signs Assessment: post-procedure vital signs reviewed and stable Respiratory status: spontaneous breathing, nonlabored ventilation and respiratory function stable Cardiovascular status: blood pressure returned to baseline and stable Postop Assessment: adequate PO intake Anesthetic complications: no   No notable events documented.   Last Vitals:  Vitals:   07/30/21 1252 07/30/21 1304  BP: 117/79 122/75  Pulse: (!) 118 (!) 107  Resp: 10 16  Temp: (!) 36.1 C (!) 36.1 C  SpO2: 95% 98%    Last Pain:  Vitals:   07/30/21 1304  TempSrc: Temporal  PainSc: 0-No pain                 Darrin Nipper

## 2021-07-30 NOTE — Anesthesia Preprocedure Evaluation (Signed)
Anesthesia Evaluation  Patient identified by MRN, date of birth, ID band Patient awake    Reviewed: Allergy & Precautions, NPO status , Patient's Chart, lab work & pertinent test results  History of Anesthesia Complications (+) Emergence Delirium and history of anesthetic complications  Airway Mallampati: III  TM Distance: >3 FB Neck ROM: Full    Dental  (+) Poor Dentition, Missing, Chipped, Dental Advidsory Given   Pulmonary shortness of breath and with exertion, sleep apnea , COPD,  COPD inhaler, neg recent URI, Patient abstained from smoking.Not current smoker, former smoker,    Pulmonary exam normal breath sounds clear to auscultation       Cardiovascular Exercise Tolerance: Poor METShypertension, Pt. on medications (-) angina+CHF  (-) CAD and (-) Past MI (-) dysrhythmias  Rhythm:Regular Rate:Normal - Systolic murmurs 1. Left ventricular ejection fraction, by estimation, is 60 to 65%. The  left ventricle has normal function. Left ventricular endocardial border  not optimally defined to evaluate regional wall motion. There is mild left  ventricular hypertrophy. Left  ventricular diastolic parameters are consistent with Grade I diastolic  dysfunction (impaired relaxation). The average left ventricular global  longitudinal strain is -16.3 %. The global longitudinal strain is normal.  2. Right ventricular systolic function is normal. The right ventricular  size is normal.  3. Left atrial size was mildly dilated.  4. The mitral valve is normal in structure. Trivial mitral valve  regurgitation. No evidence of mitral stenosis.  5. The aortic valve is normal in structure. Aortic valve regurgitation is  not visualized. No aortic stenosis is present.    Neuro/Psych  Headaches, PSYCHIATRIC DISORDERS Anxiety Depression    GI/Hepatic PUD, GERD  ,(+)     (-) substance abuse  ,   Endo/Other  neg diabetesMorbid obesity   Renal/GU CRF and ARFRenal disease     Musculoskeletal  (+) Arthritis ,   Abdominal (+) + obese,   Peds  Hematology   Anesthesia Other Findings Past Medical History: No date: Achilles tendinitis No date: Acute medial meniscal tear No date: Allergy No date: Anxiety No date: Atrial fibrillation [facility])     Comment:  a. new onset 07/2016; b. CHADS2VASc --> 4 (HTN, female,               age, CHF); c. eliquis No date: CHF (congestive heart failure) ([place]) No date: Chronic anticoagulation     Comment:  Apixaban No date: Chronic lower back pain No date: Colon polyp No date: Complication of anesthesia     Comment:  Emergence delirium; hypoxia No date: COPD (chronic obstructive pulmonary disease) ([facility]) No date: Cystic breast No date: Depression No date: Endometriosis No date: Fibrocystic breast disease No date: GERD (gastroesophageal reflux disease) No date: History of stress test     Comment:  a. 07/2016 MV: EF 55-65%. Small, mild defect  in mid               anteroseptal, apical septal, and apical locations. No               ischemia-->low risk. No date: Hyperlipidemia No date: Hypertension No date: Insomnia No date: Lipoma No date: Malignant neoplasm of upper-outer quadrant of right female  breast ([place])     Comment:  Stage 1A invasive mammary carcinoma (cT1b or T1c N0); ER              (-), PR (+), HER2/neu (+) No date: Migraine No date: Mild Mitral regurgitation     Comment:  a. 07/2016 Echo: EF 55-65%, no rwma, mild MR. Mildly dil               LA. Nl RV fx.  No date: OSA (obstructive sleep apnea)     Comment:  non-compliant with nocturnal PAP therapy No date: Osteoarthritis     Comment:  left knee No date: Osteopenia No date: Peptic ulcer disease     Comment:  a. H. pylori No date: Pneumonia No date: RLS (restless legs syndrome) No date: Tendonitis No date: Thyroid disease No date: Vitamin D deficiency  Reproductive/Obstetrics                              Anesthesia Physical  Anesthesia Plan  ASA: 3  Anesthesia Plan: General   Post-op Pain Management: Ofirmev IV (intra-op)   Induction: Intravenous  PONV Risk Score and Plan: 4 or greater and Ondansetron, Dexamethasone and Treatment may vary due to age or medical condition  Airway Management Planned: Oral ETT and LMA  Additional Equipment: None  Intra-op Plan:   Post-operative Plan: Extubation in OR  Informed Consent: I have reviewed the patients History and Physical, chart, labs and discussed the procedure including the risks, benefits and alternatives for the proposed anesthesia with the patient or authorized representative who has indicated his/her understanding and acceptance.     Dental advisory given  Plan Discussed with: CRNA and Surgeon  Anesthesia Plan Comments: (Discussed risks of anesthesia with patient, including PONV, sore throat, lip/dental/eye damage. Rare risks discussed as well, such as cardiorespiratory and neurological sequelae, and allergic reactions. Discussed the role of CRNA in patient's perioperative care. Patient understands. Patient informed about increased incidence of above perioperative risk due to high BMI. Patient understands. )        Anesthesia Quick Evaluation

## 2021-07-30 NOTE — Transfer of Care (Signed)
Immediate Anesthesia Transfer of Care Note  Patient: Amber Barnes  Procedure(s) Performed: CYSTOSCOPY/URETEROSCOPY/HOLMIUM LASER/BILATERAL STENT REMOVAL (Bilateral: Ureter)  Patient Location: PACU  Anesthesia Type:General  Level of Consciousness: drowsy  Airway & Oxygen Therapy: Patient Spontanous Breathing and Patient connected to face mask oxygen  Post-op Assessment: Report given to RN  Post vital signs: stable  Last Vitals:  Vitals Value Taken Time  BP 135/83 07/30/21 1219  Temp    Pulse 72 07/30/21 1224  Resp 14 07/30/21 1224  SpO2 100 % 07/30/21 1224  Vitals shown include unvalidated device data.  Last Pain:  Vitals:   07/30/21 0840  TempSrc: Tympanic  PainSc: 0-No pain         Complications: No notable events documented.

## 2021-07-30 NOTE — Interval H&P Note (Signed)
UROLOGY H&P UPDATE  Agree with prior H&P dated 07/12/2021.  Comorbid 71 year old female who presented with right-sided flank pain and sepsis from urinary source.  CT showed a small right distal ureteral stone, and intraoperatively both kidneys had retained contrast and bilateral ureteral stents were placed.  Presents today for definitive bilateral ureteroscopy and laser lithotripsy.  Cardiac: RRR Lungs: CTA bilaterally  Laterality: Bilateral Procedure: Bilateral ureteroscopy, laser lithotripsy, stent placement  Urine: Urine culture 1/2 no growth  We specifically discussed the risks ureteroscopy including bleeding, infection/sepsis, stent related symptoms including flank pain/urgency/frequency/incontinence/dysuria, ureteral injury, inability to access stone, or need for staged or additional procedures.   Billey Co, MD 07/30/2021

## 2021-07-30 NOTE — Anesthesia Procedure Notes (Signed)
Procedure Name: Intubation Date/Time: 07/30/2021 11:40 AM Performed by: Zetta Bills, CRNA Pre-anesthesia Checklist: Patient identified, Emergency Drugs available, Suction available and Patient being monitored Patient Re-evaluated:Patient Re-evaluated prior to induction Oxygen Delivery Method: Circle system utilized Preoxygenation: Pre-oxygenation with 100% oxygen Induction Type: IV induction Ventilation: Mask ventilation without difficulty Laryngoscope Size: Glidescope and 3 Grade View: Grade II Tube type: Oral Tube size: 7.0 mm Number of attempts: 1 Airway Equipment and Method: Stylet and Oral airway Placement Confirmation: ETT inserted through vocal cords under direct vision, positive ETCO2 and breath sounds checked- equal and bilateral Secured at: 21 cm Tube secured with: Tape Dental Injury: Teeth and Oropharynx as per pre-operative assessment

## 2021-07-31 ENCOUNTER — Encounter: Payer: Self-pay | Admitting: Urology

## 2021-08-03 ENCOUNTER — Telehealth (INDEPENDENT_AMBULATORY_CARE_PROVIDER_SITE_OTHER): Payer: Medicare Other | Admitting: Family

## 2021-08-03 ENCOUNTER — Encounter: Payer: Self-pay | Admitting: Family

## 2021-08-03 VITALS — Wt 265.0 lb

## 2021-08-03 DIAGNOSIS — T451X5D Adverse effect of antineoplastic and immunosuppressive drugs, subsequent encounter: Secondary | ICD-10-CM

## 2021-08-03 DIAGNOSIS — B356 Tinea cruris: Secondary | ICD-10-CM

## 2021-08-03 MED ORDER — FLUCONAZOLE 150 MG PO TABS: 150.0000 mg | ORAL_TABLET | ORAL | 0 refills | Status: DC

## 2021-08-03 MED ORDER — CLOTRIMAZOLE 1 % EX CREA: 1.0000 "application " | TOPICAL_CREAM | Freq: Two times a day (BID) | CUTANEOUS | 0 refills | Status: DC

## 2021-08-03 NOTE — Progress Notes (Signed)
Virtual Visit via Video   I connected with patient on 08/03/21 at  1:45 PM EST by a video enabled telemedicine application and verified that I am speaking with the correct person using two identifiers.  Location patient: Home Location provider: [employer], Office Persons participating in the virtual visit: Patient, Provider, CMA   I discussed the limitations of evaluation and management by telemedicine and the availability of in person appointments. The patient expressed understanding and agreed to proceed.  Subjective:   HPI:   71 year old female, breast cancer patient who has recently d/c chemotherapy presents with concerns of a Barnes under her pannus that has been present off and on x 10 months since she has lost 100 lbs as a result of fighting breast cancer. She now had a red irritated Barnes. She reports using a pill in the past that helped. Nystatin powder has not helped. She has stopped chemotherapy due to adverse effects and plans to have a visit to determine if radiation is necessary.   ROS:   See pertinent positives and negatives per HPI.  Patient Active Problem List   Diagnosis Date Noted   Ureterovesical junction (UVJ) obstruction 07/12/2021   Acute pyelonephritis 07/12/2021   CKD (chronic kidney disease), stage II 07/12/2021   HLD (hyperlipidemia) 07/12/2021   Chronic diastolic CHF (congestive heart failure) ([place]) 07/12/2021   Hydroureteronephrosis 07/12/2021   Pyelonephritis 07/02/2021   Drug-induced neutropenia (HCC) 06/23/2021   CKD (chronic kidney disease) 06/21/2021   Chemotherapy induced diarrhea 06/09/2021   Anemia due to antineoplastic chemotherapy 05/18/2021   Chemotherapy induced nausea and vomiting 05/18/2021   COVID-19 04/30/2021   Neutropenia (HCC)    Anxiety    Intractable vomiting    Diarrhea    Neutropenic fever ([place]) 04/05/2021   Encounter for antineoplastic chemotherapy 03/24/2021   Neuropathy 03/24/2021   Port-A-Cath in place  03/24/2021   HER2-positive carcinoma of breast ([place]) 01/26/2021   Goals of care, counseling/discussion 01/26/2021   Malignant neoplasm of upper-outer quadrant of right female breast ([place]) 01/19/2021   Left arm pain 12/02/2020   Soft tissue mass 12/02/2020   Candidal intertrigo 09/09/2020   Urinary frequency 09/09/2020   Night sweat 03/31/2020   Nasal drainage 12/18/2019   Sinus congestion 12/18/2019   Nausea 10/30/2019   Major depression in remission ([place]) 08/27/2018   Primary osteoarthritis involving multiple joints 08/27/2018   Atrial fibrillation ([place]) 08/27/2018   COPD (chronic obstructive pulmonary disease) ([place]) 08/27/2018   BMI 50.0-59.9, adult ([place]) 07/16/2018   Morbid obesity with BMI of 50.0-59.9, adult ([place]) 05/31/2018   Bradycardia 05/22/2017   Restless leg syndrome 04/13/2017   OSA (obstructive sleep apnea) 01/09/2017   A-fib ([place]) 11/28/2016   Atrial fibrillation with RVR ([place]) 07/27/2016   Fatty liver disease, nonalcoholic 29/47/6546   Family history of ovarian cancer 06/08/2016   GERD (gastroesophageal reflux disease) 05/05/2016   Hypertension 11/11/2014   Anxiety and depression 11/11/2014   Weight loss 11/11/2014    Social History   Tobacco Use   Smoking status: Former    Packs/day: 0.25    Years: 10.00    Pack years: 2.50    Types: Cigarettes    Quit date: 2005    Years since quitting: 18.0   Smokeless tobacco: Never   Tobacco comments:    smoked for 10 years, 1 pack every 2-3 days  Substance Use Topics   Alcohol use: Not Currently    Alcohol/week: 1.0 standard drink    Types: 1 Glasses  of wine per week    Comment: rarely    Current Outpatient Medications:    acetaminophen (TYLENOL) 500 MG tablet, Take 500-1,000 mg by mouth every 6 (six) hours as needed for mild pain or moderate pain., Disp: , Rfl:    ALPRAZolam (XANAX) 0.25 MG tablet, Take 0.25 mg by mouth at bedtime as needed for anxiety or sleep., Disp: , Rfl:    apixaban (ELIQUIS) 5 MG TABS  tablet, Hold until followup with outpatient provider due to bloody urine., Disp: 180 tablet, Rfl: 1   azelastine (ASTELIN) 0.1 % nasal spray, Place 1-2 sprays into both nostrils daily as needed for rhinitis., Disp: , Rfl:    bisoprolol (ZEBETA) 5 MG tablet, Hold until followup with outpatient provider due to your low heart rate. (Patient taking differently: Take 5 mg by mouth daily.), Disp: 180 tablet, Rfl: 1   busPIRone (BUSPAR) 5 MG tablet, TAKE 1 TABLET BY MOUTH THREE TIMES A DAY AS NEEDED, Disp: 60 tablet, Rfl: 1   Cholecalciferol (VITAMIN D3) 5000 units CAPS, Take 5,000 Units by mouth daily., Disp: , Rfl:    clotrimazole (LOTRIMIN) 1 % cream, Apply 1 application topically 2 (two) times daily., Disp: 30 g, Rfl: 0   diltiazem (CARDIZEM CD) 240 MG 24 hr capsule, Hold until followup with outpatient provider due to your low heart rate., Disp: 30 capsule, Rfl: 2   diphenhydrAMINE (BENADRYL) 25 mg capsule, Take 1 capsule (25 mg total) by mouth every 8 (eight) hours as needed for itching or allergies., Disp: 30 capsule, Rfl: 0   DULoxetine (CYMBALTA) 30 MG capsule, Take 1 capsule (30 mg total) by mouth 2 (two) times daily. Home med., Disp: , Rfl:    esomeprazole (NEXIUM) 20 MG capsule, Take 20 mg by mouth daily as needed (GERD symptoms)., Disp: , Rfl:    fluconazole (DIFLUCAN) 150 MG tablet, Take 1 tablet (150 mg total) by mouth every 7 (seven) days., Disp: 2 tablet, Rfl: 0   furosemide (LASIX) 20 MG tablet, Take 1 tablet (20 mg total) by mouth daily. (Patient taking differently: Take 20 mg by mouth daily. (May take additional tablet daily if needed for swelling)), Disp: 90 tablet, Rfl: 0   hydrALAZINE (APRESOLINE) 50 MG tablet, Take 1 tablet (50 mg total) by mouth 3 (three) times daily., Disp: 90 tablet, Rfl: 2   lidocaine-prilocaine (EMLA) cream, Apply to affected area once (Patient taking differently: Apply 1 application topically daily as needed (pain).), Disp: 30 g, Rfl: 3   loperamide (IMODIUM) 2 MG  capsule, Take 1 capsule (2 mg total) by mouth See admin instructions. Initial: 4 mg, followed by 2 mg after each loose stool; maximum: 16 mg/day, Disp: 60 capsule, Rfl: 1   Multiple Vitamin (MULTIVITAMIN) tablet, Take 1 tablet by mouth daily., Disp: , Rfl:    nystatin (MYCOSTATIN/NYSTOP) powder, Apply 1 application topically 3 (three) times daily., Disp: 15 g, Rfl: 1   ondansetron (ZOFRAN) 8 MG tablet, Take 1 tablet (8 mg total) by mouth every 8 (eight) hours as needed for nausea or vomiting., Disp: 60 tablet, Rfl: 0   prochlorperazine (COMPAZINE) 10 MG tablet, Take 1 tablet (10 mg total) by mouth every 6 (six) hours as needed (Nausea or vomiting)., Disp: 30 tablet, Rfl: 1   promethazine (PHENERGAN) 25 MG tablet, Take 1 tablet (25 mg total) by mouth every 6 (six) hours as needed for nausea or vomiting., Disp: 60 tablet, Rfl: 0   ranolazine (RANEXA) 500 MG 12 hr tablet, TAKE 1 TABLET BY MOUTH  TWICE A DAY, Disp: 180 tablet, Rfl: 0   rizatriptan (MAXALT) 10 MG tablet, TAKE 1 TABLET (10 MG TOTAL) BY MOUTH AS NEEDED FOR MIGRAINE. MAY REPEAT IN 2 HOURS IF NEEDED, Disp: 10 tablet, Rfl: 0   rOPINIRole (REQUIP) 2 MG tablet, Take 1 tablet (2 mg total) by mouth at bedtime., Disp: 90 tablet, Rfl: 1   rosuvastatin (CRESTOR) 20 MG tablet, TAKE 1 TABLET BY MOUTH EVERY DAY, Disp: 90 tablet, Rfl: 0  Allergies  Allergen Reactions   Prednisone Other (See Comments)    Agitation, hyperactivity, polyphagia   Amiodarone Nausea And Vomiting   Hydrocodone-Acetaminophen Other (See Comments)    Hallucinations and combative   Oxycodone Itching    Can take it with benadryl   Tapentadol Itching   Tramadol Itching    Can take it with benadryl   Adhesive [Tape] Itching and Barnes    Prefers paper tape   Latex Itching and Barnes    use paper tap    Objective:   Wt 265 lb (120.2 kg)    BMI 44.10 kg/m   Patient is well-developed, well-nourished in no acute distress.  Resting comfortably at home.  Head is normocephalic,  atraumatic.  No labored breathing.  Speech is clear and coherent with logical content.  Patient is alert and oriented at baseline.    Assessment and Plan:   Amber Barnes was seen today for Barnes.  Diagnoses and all orders for this visit:  Tinea cruris  Adverse effect of chemotherapy, subsequent encounter  Other orders -     fluconazole (DIFLUCAN) 150 MG tablet; Take 1 tablet (150 mg total) by mouth every 7 (seven) days. -     clotrimazole (LOTRIMIN) 1 % cream; Apply 1 application topically 2 (two) times daily.   Call the office if symptoms worsen or persist. Recheck as scheduled and sooner as needed.    Amber Arnold, FNP 08/03/2021

## 2021-08-05 ENCOUNTER — Encounter: Payer: Self-pay | Admitting: Radiation Oncology

## 2021-08-05 ENCOUNTER — Other Ambulatory Visit: Payer: Self-pay

## 2021-08-05 ENCOUNTER — Telehealth: Payer: Self-pay | Admitting: Cardiovascular Disease

## 2021-08-05 ENCOUNTER — Ambulatory Visit
Admission: RE | Admit: 2021-08-05 | Discharge: 2021-08-05 | Disposition: A | Discharge status: Home / Self Care | Payer: Medicare Other | Source: Ambulatory Visit | Attending: Radiation Oncology | Admitting: Radiation Oncology

## 2021-08-05 VITALS — BP 121/78 | HR 67 | Temp 97.9°F | Resp 16 | Wt 259.5 lb

## 2021-08-05 DIAGNOSIS — C50411 Malignant neoplasm of upper-outer quadrant of right female breast: Secondary | ICD-10-CM | POA: Diagnosis not present

## 2021-08-05 DIAGNOSIS — Z170 Estrogen receptor positive status [ER+]: Secondary | ICD-10-CM | POA: Insufficient documentation

## 2021-08-05 DIAGNOSIS — C50919 Malignant neoplasm of unspecified site of unspecified female breast: Secondary | ICD-10-CM

## 2021-08-05 NOTE — Consult Note (Signed)
NEW PATIENT EVALUATION  Name: Amber Barnes  MRN: [id number]  Date:   08/05/2021     DOB: [birth year]   This 71 y.o. female patient presents to the clinic for initial evaluation of stage Ia (T1b N0 M0) HER2/neu positive invasive mammary carcinoma the right breast status post wide local excision sentinel biopsy and adjuvant chemotherapy  REFERRING PHYSICIAN: [name], FNP  CHIEF COMPLAINT:  Chief Complaint  Patient presents with   Breast Cancer    Initial consultation    DIAGNOSIS: The encounter diagnosis was HER2-positive carcinoma of breast ([place]).   PREVIOUS INVESTIGATIONS:  Mammogram and ultrasound reviewed Pathology report reviewed Clinical notes reviewed  HPI: Patient is a 71 year old female who presented with a suspicious mass in the right breast 10 o'clock position 9 cm from the nipple.  There is also an adjacent smaller satellite mass in the upper outer right breast.  Axilla was within normal notes on ultrasound.  Ultrasound-guided biopsy was positive for invasive mammary carcinoma HER2/neu positive.  She went on to have a wide local excision for an overall grade 3 invasive carcinoma no special type.  Tumor measured 1 cm.  Ductal carcinoma in situ was present.  Lymphovascular invasion not identified.  Margins were clear at 3 mm.  1 sentinel node was negative.  Tumor was ER negative PR positive and HER2/neu overexpressed.  Patient has been treated with Taxol and trastuzumab.  Patient developed COVID infection although her treatments have been delayed for hospitalization secondary to acute pyelonephritis UVJ obstruction and had bilateral utero stents placed.  She has had Taxol decreased although still not tolerating her treatments well and that has been discontinued.  She is currently on treated Taz Mab maintenance.  She is now referred to radiation collagen for evaluation.  From a breast standpoint she is doing well she still has some tenderness.  Incision is  well-healed.  PLANNED TREATMENT REGIMEN: Hypofractionated right whole breast radiation  PAST MEDICAL HISTORY:  has a past medical history of Achilles tendinitis, Acute medial meniscal tear, Allergy, Anxiety, Atrial fibrillation (HCC), CHF (congestive heart failure) (HCC), Chronic anticoagulation, Chronic lower back pain, Colon polyp, Complication of anesthesia, COPD (chronic obstructive pulmonary disease) ([place]), Cystic breast, Depression, Endometriosis, Fibrocystic breast disease, GERD (gastroesophageal reflux disease), History of stress test, Hyperlipidemia, Hypertension, Insomnia, Lipoma, Malignant neoplasm of upper-outer quadrant of right female breast ([place]), Migraine, Mild Mitral regurgitation, Nephrolithiasis, OSA (obstructive sleep apnea), Osteoarthritis, Osteopenia, Peptic ulcer disease, Pneumonia, RLS (restless legs syndrome), Tendonitis, Thyroid disease, and Vitamin D deficiency.    PAST SURGICAL HISTORY:  Past Surgical History:  Procedure Laterality Date   ABDOMINAL HYSTERECTOMY     ovaries left   ACHILLES TENDON SURGERY     2012,01/2017   BACK SURGERY     fusion lumbar   BREAST BIOPSY Right 01/15/2021   Affirm biopsy-"X" clip-INVASIVE MAMMARY CARCINOMA   BREAST BIOPSY Right 01/15/2021   u/s bx-"venus" clip INVASIVE MAMMARY CARCINOMA   BREAST BIOPSY     BREAST LUMPECTOMY,RADIO FREQ LOCALIZER,AXILLARY SENTINEL LYMPH NODE BIOPSY Right 02/17/2021   Procedure: BREAST LUMPECTOMY,RADIO FREQ LOCALIZER,AXILLARY SENTINEL LYMPH NODE BIOPSY;  Surgeon: Ronny Bacon, MD;  Location: [facility];  Service: General;  Laterality: Right;   CARDIOVERSION N/A 09/07/2016   Procedure: CARDIOVERSION;  Surgeon: Minna Merritts, MD;  Location: [facility];  Service: Cardiovascular;  Laterality: N/A;   CARDIOVERSION N/A 10/19/2016   Procedure: CARDIOVERSION;  Surgeon: Minna Merritts, MD;  Location: [facility];  Service: Cardiovascular;  Laterality: N/A;   CARDIOVERSION N/A 12/14/2016  Procedure:  CARDIOVERSION;  Surgeon: Minna Merritts, MD;  Location: [facility];  Service: Cardiovascular;  Laterality: N/A;   COLONOSCOPY     COLONOSCOPY WITH PROPOFOL N/A 05/13/2020   Procedure: COLONOSCOPY WITH PROPOFOL;  Surgeon: Toledo, Benay Pike, MD;  Location: [facility];  Service: Gastroenterology;  Laterality: N/A;  ELIQUIS   CYSTOSCOPY WITH STENT PLACEMENT Bilateral 07/12/2021   Procedure: CYSTOSCOPY WITH STENT PLACEMENT;  Surgeon: Billey Co, MD;  Location: [facility];  Service: Urology;  Laterality: Bilateral;   CYSTOSCOPY/URETEROSCOPY/HOLMIUM LASER/STENT PLACEMENT Bilateral 07/30/2021   Procedure: CYSTOSCOPY/URETEROSCOPY/HOLMIUM LASER/BILATERAL STENT REMOVAL;  Surgeon: Billey Co, MD;  Location: [facility];  Service: Urology;  Laterality: Bilateral;   ESOPHAGOGASTRODUODENOSCOPY     ESOPHAGOGASTRODUODENOSCOPY (EGD) WITH PROPOFOL N/A 05/13/2020   Procedure: ESOPHAGOGASTRODUODENOSCOPY (EGD) WITH PROPOFOL;  Surgeon: Toledo, Benay Pike, MD;  Location: [facility];  Service: Gastroenterology;  Laterality: N/A;   FOOT SURGERY Left    tendon   JOINT REPLACEMENT Right    hip   PORTA CATH INSERTION N/A 03/23/2021   Procedure: PORTA CATH INSERTION;  Surgeon: Katha Cabal, MD;  Location: [facility];  Service: Cardiovascular;  Laterality: N/A;   PORTACATH PLACEMENT N/A 03/23/2021   Procedure: ATTEMPTED INSERTION PORT-A-CATH;  Surgeon: Ronny Bacon, MD;  Location: [facility];  Service: General;  Laterality: N/A;   THYROIDECTOMY     thyroid lobectomy secondary to a benign nodule   TOTAL HIP ARTHROPLASTY Right 08/21/2018   Procedure: TOTAL HIP ARTHROPLASTY ANTERIOR APPROACH-RIGHT CELL SAVER REQUESTED;  Surgeon: [name], MD;  Location: [facility];  Service: Orthopedics;  Laterality: Right;    FAMILY HISTORY: family history includes Breast cancer in her paternal aunt; Cancer in her brother and mother; Cancer (age of onset: 70) in her sister; Cancer (age of onset: 44) in her  father; Depression in her sister; Heart attack (age of onset: 49) in her father; Heart disease in her father; Hyperlipidemia in her father; Hypertension in her father.  SOCIAL HISTORY:  reports that she quit smoking about 18 years ago. Her smoking use included cigarettes. She has a 2.50 pack-year smoking history. She has never used smokeless tobacco. She reports that she does not currently use alcohol after a past usage of about 1.0 standard drink per week. She reports that she does not use drugs.  ALLERGIES: Prednisone, Amiodarone, Hydrocodone-acetaminophen, Oxycodone, Tapentadol, Tramadol, Adhesive [tape], and Latex  MEDICATIONS:  Current Outpatient Medications  Medication Sig Dispense Refill   acetaminophen (TYLENOL) 500 MG tablet Take 500-1,000 mg by mouth every 6 (six) hours as needed for mild pain or moderate pain.     ALPRAZolam (XANAX) 0.25 MG tablet Take 0.25 mg by mouth at bedtime as needed for anxiety or sleep.     apixaban (ELIQUIS) 5 MG TABS tablet Hold until followup with outpatient provider due to bloody urine. 180 tablet 1   azelastine (ASTELIN) 0.1 % nasal spray Place 1-2 sprays into both nostrils daily as needed for rhinitis.     bisoprolol (ZEBETA) 5 MG tablet Hold until followup with outpatient provider due to your low heart rate. (Patient taking differently: Take 5 mg by mouth daily.) 180 tablet 1   busPIRone (BUSPAR) 5 MG tablet TAKE 1 TABLET BY MOUTH THREE TIMES A DAY AS NEEDED 60 tablet 1   Cholecalciferol (VITAMIN D3) 5000 units CAPS Take 5,000 Units by mouth daily.     clotrimazole (LOTRIMIN) 1 % cream Apply 1 application topically 2 (two) times daily. 30 g 0   diltiazem (CARDIZEM  CD) 240 MG 24 hr capsule Hold until followup with outpatient provider due to your low heart rate. 30 capsule 2   diphenhydrAMINE (BENADRYL) 25 mg capsule Take 1 capsule (25 mg total) by mouth every 8 (eight) hours as needed for itching or allergies. 30 capsule 0   DULoxetine (CYMBALTA) 30 MG  capsule Take 1 capsule (30 mg total) by mouth 2 (two) times daily. Home med.     esomeprazole (NEXIUM) 20 MG capsule Take 20 mg by mouth daily as needed (GERD symptoms).     fluconazole (DIFLUCAN) 150 MG tablet Take 1 tablet (150 mg total) by mouth every 7 (seven) days. 2 tablet 0   furosemide (LASIX) 20 MG tablet Take 1 tablet (20 mg total) by mouth daily. (Patient taking differently: Take 20 mg by mouth daily. (May take additional tablet daily if needed for swelling)) 90 tablet 0   hydrALAZINE (APRESOLINE) 50 MG tablet Take 1 tablet (50 mg total) by mouth 3 (three) times daily. 90 tablet 2   lidocaine-prilocaine (EMLA) cream Apply to affected area once (Patient taking differently: Apply 1 application topically daily as needed (pain).) 30 g 3   loperamide (IMODIUM) 2 MG capsule Take 1 capsule (2 mg total) by mouth See admin instructions. Initial: 4 mg, followed by 2 mg after each loose stool; maximum: 16 mg/day 60 capsule 1   Multiple Vitamin (MULTIVITAMIN) tablet Take 1 tablet by mouth daily.     nystatin (MYCOSTATIN/NYSTOP) powder Apply 1 application topically 3 (three) times daily. 15 g 1   ondansetron (ZOFRAN) 8 MG tablet Take 1 tablet (8 mg total) by mouth every 8 (eight) hours as needed for nausea or vomiting. 60 tablet 0   prochlorperazine (COMPAZINE) 10 MG tablet Take 1 tablet (10 mg total) by mouth every 6 (six) hours as needed (Nausea or vomiting). 30 tablet 1   promethazine (PHENERGAN) 25 MG tablet Take 1 tablet (25 mg total) by mouth every 6 (six) hours as needed for nausea or vomiting. 60 tablet 0   ranolazine (RANEXA) 500 MG 12 hr tablet TAKE 1 TABLET BY MOUTH TWICE A DAY 180 tablet 0   rizatriptan (MAXALT) 10 MG tablet TAKE 1 TABLET (10 MG TOTAL) BY MOUTH AS NEEDED FOR MIGRAINE. MAY REPEAT IN 2 HOURS IF NEEDED 10 tablet 0   rOPINIRole (REQUIP) 2 MG tablet Take 1 tablet (2 mg total) by mouth at bedtime. 90 tablet 1   rosuvastatin (CRESTOR) 20 MG tablet TAKE 1 TABLET BY MOUTH EVERY DAY  90 tablet 0   No current facility-administered medications for this encounter.    ECOG PERFORMANCE STATUS:  0 - Asymptomatic  REVIEW OF SYSTEMS: Patient is recently had ureteral stents placed and removed for ureteral stones.  Also history biopsy of her stomach which was benign. Patient denies any weight loss, fatigue, weakness, fever, chills or night sweats. Patient denies any loss of vision, blurred vision. Patient denies any ringing  of the ears or hearing loss. No irregular heartbeat. Patient denies heart murmur or history of fainting. Patient denies any chest pain or pain radiating to her upper extremities. Patient denies any shortness of breath, difficulty breathing at night, cough or hemoptysis. Patient denies any swelling in the lower legs. Patient denies any nausea vomiting, vomiting of blood, or coffee ground material in the vomitus. Patient denies any stomach pain. Patient states has had normal bowel movements no significant constipation or diarrhea. Patient denies any dysuria, hematuria or significant nocturia. Patient denies any problems walking, swelling in the  joints or loss of balance. Patient denies any skin changes, loss of hair or loss of weight. Patient denies any excessive worrying or anxiety or significant depression. Patient denies any problems with insomnia. Patient denies excessive thirst, polyuria, polydipsia. Patient denies any swollen glands, patient denies easy bruising or easy bleeding. Patient denies any recent infections, allergies or URI. Patient "s visual fields have not changed significantly in recent time.   PHYSICAL EXAM: BP 121/78 (BP Location: Right Wrist, Patient Position: Sitting, Cuff Size: Small)    Pulse 67    Temp 97.9 F (36.6 C)    Resp 16    Wt 259 lb 8 oz (117.7 kg) Comment: stated weight   BMI 43.18 kg/m  Obese female wheelchair-bound in NAD.  No dominant masses noted in either breast no axillary or supraclavicular adenopathy is identified.  Patient has  alopecia of the scalp.  Well-developed well-nourished patient in NAD. HEENT reveals PERLA, EOMI, discs not visualized.  Oral cavity is clear. No oral mucosal lesions are identified. Neck is clear without evidence of cervical or supraclavicular adenopathy. Lungs are clear to A&P. Cardiac examination is essentially unremarkable with regular rate and rhythm without murmur rub or thrill. Abdomen is benign with no organomegaly or masses noted. Motor sensory and DTR levels are equal and symmetric in the upper and lower extremities. Cranial nerves II through XII are grossly intact. Proprioception is intact. No peripheral adenopathy or edema is identified. No motor or sensory levels are noted. Crude visual fields are within normal range.  LABORATORY DATA: Pathology report reviewed    RADIOLOGY RESULTS: Mammogram and ultrasound reviewed compatible with above-stated findings   IMPRESSION: Stage Ia HER2/neu overexpressed invasive mammary carcinoma right breast status post wide local excision and adjuvant chemotherapy now for whole breast radiation  PLAN: At this time I have recommended whole breast radiation I believe I can treat over 3 weeks time or whole breast boosting her scar another 1000 cGy using electron beam.  Risks and benefits of treatment including skin reaction fatigue alteration of blood counts possible occlusion of superficial lung all were discussed in detail with the patient she seems to comprehend my treatment plan well.  I have personally set up and ordered CT simulation for next week to allow her further recovery from her chemotherapy.  Patient again comprehends my treatment plan well.  I would like to take this opportunity to thank you for allowing me to participate in the care of your patient.Noreene Filbert, MD

## 2021-08-05 NOTE — Telephone Encounter (Signed)
°  Patient Consent for Virtual Visit        Amber Barnes has provided verbal consent on 08/05/2021 for a virtual visit (video or telephone).   CONSENT FOR VIRTUAL VISIT FOR:  Amber Barnes  By participating in this virtual visit I agree to the following:  I hereby voluntarily request, consent and authorize [facility] and its employed or contracted physicians, physician assistants, nurse practitioners or other licensed health care professionals (the Practitioner), to provide me with telemedicine health care services (the Services") as deemed necessary by the treating Practitioner. I acknowledge and consent to receive the Services by the Practitioner via telemedicine. I understand that the telemedicine visit will involve communicating with the Practitioner through live audiovisual communication technology and the disclosure of certain medical information by electronic transmission. I acknowledge that I have been given the opportunity to request an in-person assessment or other available alternative prior to the telemedicine visit and am voluntarily participating in the telemedicine visit.  I understand that I have the right to withhold or withdraw my consent to the use of telemedicine in the course of my care at any time, without affecting my right to future care or treatment, and that the Practitioner or I may terminate the telemedicine visit at any time. I understand that I have the right to inspect all information obtained and/or recorded in the course of the telemedicine visit and may receive copies of available information for a reasonable fee.  I understand that some of the potential risks of receiving the Services via telemedicine include:  Delay or interruption in medical evaluation due to technological equipment failure or disruption; Information transmitted may not be sufficient (e.g. poor resolution of images) to allow for appropriate medical decision making by the Practitioner;  and/or  In rare instances, security protocols could fail, causing a breach of personal health information.  Furthermore, I acknowledge that it is my responsibility to provide information about my medical history, conditions and care that is complete and accurate to the best of my ability. I acknowledge that Practitioner's advice, recommendations, and/or decision may be based on factors not within their control, such as incomplete or inaccurate data provided by me or distortions of diagnostic images or specimens that may result from electronic transmissions. I understand that the practice of medicine is not an exact science and that Practitioner makes no warranties or guarantees regarding treatment outcomes. I acknowledge that a copy of this consent can be made available to me via my patient portal ([facility]), or I can request a printed copy by calling the office of [facility].    I understand that my insurance will be billed for this visit.   I have read or had this consent read to me. I understand the contents of this consent, which adequately explains the benefits and risks of the Services being provided via telemedicine.  I have been provided ample opportunity to ask questions regarding this consent and the Services and have had my questions answered to my satisfaction. I give my informed consent for the services to be provided through the use of telemedicine in my medical care

## 2021-08-09 ENCOUNTER — Other Ambulatory Visit: Payer: Self-pay

## 2021-08-09 ENCOUNTER — Telehealth (INDEPENDENT_AMBULATORY_CARE_PROVIDER_SITE_OTHER): Payer: Medicare Other | Admitting: Cardiovascular Disease

## 2021-08-09 ENCOUNTER — Encounter: Payer: Self-pay | Admitting: Cardiovascular Disease

## 2021-08-09 VITALS — BP 126/72 | HR 67 | Ht 65.0 in | Wt 259.0 lb

## 2021-08-09 DIAGNOSIS — C50919 Malignant neoplasm of unspecified site of unspecified female breast: Secondary | ICD-10-CM

## 2021-08-09 DIAGNOSIS — G629 Polyneuropathy, unspecified: Secondary | ICD-10-CM | POA: Diagnosis not present

## 2021-08-09 DIAGNOSIS — I4891 Unspecified atrial fibrillation: Secondary | ICD-10-CM

## 2021-08-09 DIAGNOSIS — N189 Chronic kidney disease, unspecified: Secondary | ICD-10-CM

## 2021-08-09 DIAGNOSIS — M1712 Unilateral primary osteoarthritis, left knee: Secondary | ICD-10-CM | POA: Diagnosis not present

## 2021-08-09 DIAGNOSIS — I5032 Chronic diastolic (congestive) heart failure: Secondary | ICD-10-CM

## 2021-08-09 DIAGNOSIS — C50911 Malignant neoplasm of unspecified site of right female breast: Secondary | ICD-10-CM | POA: Diagnosis not present

## 2021-08-09 DIAGNOSIS — I10 Essential (primary) hypertension: Secondary | ICD-10-CM | POA: Diagnosis not present

## 2021-08-09 DIAGNOSIS — J449 Chronic obstructive pulmonary disease, unspecified: Secondary | ICD-10-CM | POA: Diagnosis not present

## 2021-08-09 DIAGNOSIS — I509 Heart failure, unspecified: Secondary | ICD-10-CM | POA: Diagnosis not present

## 2021-08-09 MED ORDER — DILTIAZEM HCL ER COATED BEADS 240 MG PO CP24: 240.0000 mg | ORAL_CAPSULE | Freq: Every day | ORAL | 7 refills | Status: DC

## 2021-08-09 NOTE — Progress Notes (Signed)
Virtual Visit via Video Note   This visit type was conducted due to national recommendations for restrictions regarding the COVID-19 Pandemic (e.g. social distancing) in an effort to limit this patient's exposure and mitigate transmission in our community.  Due to her co-morbid illnesses, this patient is at least at moderate risk for complications without adequate follow up.  This format is felt to be most appropriate for this patient at this time.  All issues noted in this document were discussed and addressed.  A limited physical exam was performed with this format.  Please refer to the patient's chart for her consent to telehealth for [facility].   I connected with  [name] on 08/09/21 by a video enabled telemedicine application and verified that I am speaking with the correct person using two identifiers. I am contacting the patient above from our cardiology clinic office or alternate office work station to their home, I discussed the limitations of evaluation and management by telemedicine. The patient expressed understanding and agreed to proceed.   Evaluation Performed:  Follow-up visit  Date:  08/09/2021   ID:  Amber Barnes, DOB 1951/02/19, MRN [id number]  Patient Location:  [place] [id number]   Provider location:   [facility], [place] office  PCP:  [name], FNP  Cardiologist:  Patsy Baltimore  Chief Complaint  Patient presents with   Atrial Fibrillation    Patient c/o shortness of breath when in A-Fib. Medications reviewed by the patient verbally.     History of Present Illness:    Amber Barnes is a 71 y.o. female who presents via audio/video conferencing for a telehealth visit today.   The patient does not symptoms concerning for COVID-19 infection (fever, chills, cough, or new SHORTNESS OF BREATH).   Patient has a past medical history of Kaela Beitz is a 71 y.o. female  past  medical history of Paroxysmal atrial fibrillation, cardioversion 2018 Normal sinus rhythm did not hold on flecainide, changed to amiodarone  cardioversion, normal sinus rhythm obesity,  arthritis,  hyperlipidemia,  hypertension,  OSA , not on CPAP Migraines, anxiety Stress test January 2018 showing no ischemia, Ejection fraction 55-65% CHA2DS2-VASc of at least 3 (female, age, HTN). who presents for follow-up of her hyperlipidemia, hypertension, persistent atrial fibrillation  In hospital 6 days of IV antibiotic for your kidney infection Acute pyelonephritis and ureterovesical junction (UVJ) obstruction and right hydroureteronephrosis --Dr. Diamantina Providence of urology did urgent Bilateral ureteral stent placement.  bisoprolol and Cardizem are held due to your low heart rate.   Eliquis due to significant blood in your urine. Was in atrial fib on arrival D/c on 07/17/2021  98-120/70 at home   --Hgb 11.5 on presentation, dropped to 8.8 on 1/5 Repeat 11.0  Now back on eliquis,(stents out)  breast cancer (s/p of mastectomy, on chemo therapy) --chemo on hold due to active infection  Starting XRT next week, on right  NSR 07/29/2021 when echo performed  On lasix once a day Watching ankles   other past medical hx reviewed Hx of breast cancer HER2 positive in July 2022, underwent lumpectomy, lymph node biopsy, recent start of chemotherapy with weekly Taxol plus  trastuzumab .    Chemotherapy early September 2022 She presented to the emergency room September 26, anorexia, malaise, shortness of breath, atrial fibrillation Heart rate in the emergency room 137 bpm Started on broad-spectrum antibiotics for possible sepsis, neutropenic fevers Started on diltiazem infusion for rate control,  has been continued on her Eliquis Heart rate running low 100 bpm, asymptomatic   cardioversion procedure 09/07/2016 Normal sinus rhythm restored at that time Back into atrial fibrillation on last hospital  visit March 13   Flecainide held, on amiodarone Repeat cardioversion 10/19/2016 Normal sinus rhythm restored   Recurrent atrial fib Started on ranexa and continued on amiodarone She converted to normal sinus rhythm on her own, did not need DCCV   In the hospital for foot surgery, this was delayed as she was found to be in atrial fibrillation Admitted to the hospital 07/27/2016 Started on diltiazem, beta blocker, anticoagulation   Back in hospsital 08/10/2016 Hospital records reviewed She was very anxious about palpitations Developed chest discomfort, tightness Medication doses were increased for better rate control Lasix in the emergency room for shortness of breath Also given DuoNeb's    Past Medical History:  Diagnosis Date   Achilles tendinitis    Acute medial meniscal tear    Allergy    Anxiety    Atrial fibrillation ([place])    a. new onset 07/2016; b. CHADS2VASc --> 4 (HTN, female, age, CHF); c. eliquis   CHF (congestive heart failure) (HCC)    Chronic anticoagulation    Apixaban   Chronic lower back pain    Colon polyp    Complication of anesthesia    Emergence delirium; hypoxia   COPD (chronic obstructive pulmonary disease) ([place])    Cystic breast    Depression    Endometriosis    Fibrocystic breast disease    GERD (gastroesophageal reflux disease)    History of stress test    a. 07/2016 MV: EF 55-65%. Small, mild defect  in mid anteroseptal, apical septal, and apical locations. No ischemia-->low risk.   Hyperlipidemia    Hypertension    Insomnia    Lipoma    Malignant neoplasm of upper-outer quadrant of right female breast (HCC)    Stage 1A invasive mammary carcinoma (cT1b or T1c N0); ER (-), PR (+), HER2/neu (+)   Migraine    Mild Mitral regurgitation    a. 07/2016 Echo: EF 55-65%, no rwma, mild MR. Mildly dil LA. Nl RV fx.    Nephrolithiasis    OSA (obstructive sleep apnea)    non-compliant with nocturnal PAP therapy   Osteoarthritis    left knee    Osteopenia    Peptic ulcer disease    a. H. pylori   Pneumonia    RLS (restless legs syndrome)    Tendonitis    Thyroid disease    Vitamin D deficiency    Past Surgical History:  Procedure Laterality Date   ABDOMINAL HYSTERECTOMY     ovaries left   ACHILLES TENDON SURGERY     2012,01/2017   BACK SURGERY     fusion lumbar   BREAST BIOPSY Right 01/15/2021   Affirm biopsy-"X" clip-INVASIVE MAMMARY CARCINOMA   BREAST BIOPSY Right 01/15/2021   u/s bx-"venus" clip INVASIVE MAMMARY CARCINOMA   BREAST BIOPSY     BREAST LUMPECTOMY,RADIO FREQ LOCALIZER,AXILLARY SENTINEL LYMPH NODE BIOPSY Right 02/17/2021   Procedure: BREAST LUMPECTOMY,RADIO FREQ LOCALIZER,AXILLARY SENTINEL LYMPH NODE BIOPSY;  Surgeon: Ronny Bacon, MD;  Location: [facility];  Service: General;  Laterality: Right;   CARDIOVERSION N/A 09/07/2016   Procedure: CARDIOVERSION;  Surgeon: Minna Merritts, MD;  Location: [facility];  Service: Cardiovascular;  Laterality: N/A;   CARDIOVERSION N/A 10/19/2016   Procedure: CARDIOVERSION;  Surgeon: Minna Merritts, MD;  Location: [facility];  Service: Cardiovascular;  Laterality: N/A;  CARDIOVERSION N/A 12/14/2016   Procedure: CARDIOVERSION;  Surgeon: Minna Merritts, MD;  Location: [facility];  Service: Cardiovascular;  Laterality: N/A;   COLONOSCOPY     COLONOSCOPY WITH PROPOFOL N/A 05/13/2020   Procedure: COLONOSCOPY WITH PROPOFOL;  Surgeon: Toledo, Benay Pike, MD;  Location: [facility];  Service: Gastroenterology;  Laterality: N/A;  ELIQUIS   CYSTOSCOPY WITH STENT PLACEMENT Bilateral 07/12/2021   Procedure: CYSTOSCOPY WITH STENT PLACEMENT;  Surgeon: Billey Co, MD;  Location: [facility];  Service: Urology;  Laterality: Bilateral;   CYSTOSCOPY/URETEROSCOPY/HOLMIUM LASER/STENT PLACEMENT Bilateral 07/30/2021   Procedure: CYSTOSCOPY/URETEROSCOPY/HOLMIUM LASER/BILATERAL STENT REMOVAL;  Surgeon: Billey Co, MD;  Location: [facility];  Service: Urology;  Laterality: Bilateral;    ESOPHAGOGASTRODUODENOSCOPY     ESOPHAGOGASTRODUODENOSCOPY (EGD) WITH PROPOFOL N/A 05/13/2020   Procedure: ESOPHAGOGASTRODUODENOSCOPY (EGD) WITH PROPOFOL;  Surgeon: Toledo, Benay Pike, MD;  Location: [facility];  Service: Gastroenterology;  Laterality: N/A;   FOOT SURGERY Left    tendon   JOINT REPLACEMENT Right    hip   PORTA CATH INSERTION N/A 03/23/2021   Procedure: PORTA CATH INSERTION;  Surgeon: Katha Cabal, MD;  Location: [facility];  Service: Cardiovascular;  Laterality: N/A;   PORTACATH PLACEMENT N/A 03/23/2021   Procedure: ATTEMPTED INSERTION PORT-A-CATH;  Surgeon: Ronny Bacon, MD;  Location: [facility];  Service: General;  Laterality: N/A;   THYROIDECTOMY     thyroid lobectomy secondary to a benign nodule   TOTAL HIP ARTHROPLASTY Right 08/21/2018   Procedure: TOTAL HIP ARTHROPLASTY ANTERIOR APPROACH-RIGHT CELL SAVER REQUESTED;  Surgeon: [name], MD;  Location: [facility];  Service: Orthopedics;  Laterality: Right;      Allergies:   Prednisone, Amiodarone, Hydrocodone-acetaminophen, Oxycodone, Tapentadol, Tramadol, Adhesive [tape], and Latex   Social History   Tobacco Use   Smoking status: Former    Packs/day: 0.25    Years: 10.00    Pack years: 2.50    Types: Cigarettes    Quit date: 2005    Years since quitting: 18.0   Smokeless tobacco: Never   Tobacco comments:    smoked for 10 years, 1 pack every 2-3 days  Vaping Use   Vaping Use: Never used  Substance Use Topics   Alcohol use: Not Currently    Alcohol/week: 1.0 standard drink    Types: 1 Glasses of wine per week    Comment: rarely   Drug use: No     Current Outpatient Medications on File Prior to Visit  Medication Sig Dispense Refill   acetaminophen (TYLENOL) 500 MG tablet Take 500-1,000 mg by mouth every 6 (six) hours as needed for mild pain or moderate pain.     ALPRAZolam (XANAX) 0.25 MG tablet Take 0.25 mg by mouth at bedtime as needed for anxiety or sleep.     apixaban  (ELIQUIS) 5 MG TABS tablet Take 5 mg by mouth 2 (two) times daily.     azelastine (ASTELIN) 0.1 % nasal spray Place 1-2 sprays into both nostrils daily as needed for rhinitis.     bisoprolol (ZEBETA) 5 MG tablet Take 5 mg by mouth in the morning and at bedtime.     busPIRone (BUSPAR) 5 MG tablet TAKE 1 TABLET BY MOUTH THREE TIMES A DAY AS NEEDED 60 tablet 1   Cholecalciferol (VITAMIN D3) 5000 units CAPS Take 5,000 Units by mouth daily.     clotrimazole (LOTRIMIN) 1 % cream Apply 1 application topically 2 (two) times daily. 30 g 0   diltiazem (CARDIZEM CD) 240 MG  24 hr capsule Hold until followup with outpatient provider due to your low heart rate. 30 capsule 2   diphenhydrAMINE (BENADRYL) 25 mg capsule Take 1 capsule (25 mg total) by mouth every 8 (eight) hours as needed for itching or allergies. 30 capsule 0   DULoxetine (CYMBALTA) 30 MG capsule Take 1 capsule (30 mg total) by mouth 2 (two) times daily. Home med.     esomeprazole (NEXIUM) 20 MG capsule Take 20 mg by mouth daily as needed (GERD symptoms).     furosemide (LASIX) 20 MG tablet Take 20 mg by mouth daily.     hydrALAZINE (APRESOLINE) 50 MG tablet Take 1 tablet (50 mg total) by mouth 3 (three) times daily. 90 tablet 2   loperamide (IMODIUM) 2 MG capsule Take 1 capsule (2 mg total) by mouth See admin instructions. Initial: 4 mg, followed by 2 mg after each loose stool; maximum: 16 mg/day 60 capsule 1   Multiple Vitamin (MULTIVITAMIN) tablet Take 1 tablet by mouth daily.     nystatin (MYCOSTATIN/NYSTOP) powder Apply 1 application topically 3 (three) times daily. 15 g 1   ondansetron (ZOFRAN) 8 MG tablet Take 1 tablet (8 mg total) by mouth every 8 (eight) hours as needed for nausea or vomiting. 60 tablet 0   prochlorperazine (COMPAZINE) 10 MG tablet Take 1 tablet (10 mg total) by mouth every 6 (six) hours as needed (Nausea or vomiting). 30 tablet 1   promethazine (PHENERGAN) 25 MG tablet Take 1 tablet (25 mg total) by mouth every 6 (six)  hours as needed for nausea or vomiting. 60 tablet 0   ranolazine (RANEXA) 500 MG 12 hr tablet TAKE 1 TABLET BY MOUTH TWICE A DAY 180 tablet 0   rizatriptan (MAXALT) 10 MG tablet TAKE 1 TABLET (10 MG TOTAL) BY MOUTH AS NEEDED FOR MIGRAINE. MAY REPEAT IN 2 HOURS IF NEEDED 10 tablet 0   rOPINIRole (REQUIP) 2 MG tablet Take 1 tablet (2 mg total) by mouth at bedtime. 90 tablet 1   rosuvastatin (CRESTOR) 20 MG tablet TAKE 1 TABLET BY MOUTH EVERY DAY 90 tablet 0   fluconazole (DIFLUCAN) 150 MG tablet Take 1 tablet (150 mg total) by mouth every 7 (seven) days. (Patient not taking: Reported on 08/09/2021) 2 tablet 0   lidocaine-prilocaine (EMLA) cream Apply to affected area once (Patient not taking: Reported on 08/09/2021) 30 g 3   No current facility-administered medications on file prior to visit.     Family Hx: The patient's family history includes Breast cancer in her paternal aunt; Cancer in her brother and mother; Cancer (age of onset: 74) in her sister; Cancer (age of onset: 20) in her father; Depression in her sister; Heart attack (age of onset: 60) in her father; Heart disease in her father; Hyperlipidemia in her father; Hypertension in her father. There is no history of Migraines.  ROS:   Please see the history of present illness.    Review of Systems  Constitutional: Negative.   HENT: Negative.    Respiratory: Negative.    Cardiovascular: Negative.   Gastrointestinal: Negative.   Musculoskeletal: Negative.   Neurological: Negative.   Psychiatric/Behavioral: Negative.    All other systems reviewed and are negative.    Labs/Other Tests and Data Reviewed:    Recent Labs: 07/02/2021: TSH 2.060 07/12/2021: B Natriuretic Peptide 58.1 07/17/2021: Magnesium 2.1 07/23/2021: ALT 19; BUN 12; Creatinine, Ser 0.77; Hemoglobin 11.0; Platelets 240; Potassium 3.7; Sodium 135   Recent Lipid Panel Lab Results  Component Value Date/Time  CHOL 190 03/25/2020 08:02 AM   TRIG 122.0 03/25/2020 08:02 AM    HDL 69.30 03/25/2020 08:02 AM   CHOLHDL 3 03/25/2020 08:02 AM   LDLCALC 96 03/25/2020 08:02 AM    Wt Readings from Last 3 Encounters:  08/09/21 259 lb (117.5 kg)  08/05/21 259 lb 8 oz (117.7 kg)  08/03/21 265 lb (120.2 kg)     Exam:    Vital Signs: Vital signs may also be detailed in the HPI BP 126/72 (BP Location: Left Arm, Patient Position: Sitting, Cuff Size: Normal)    Pulse 67    Ht _0  (1.651 m)    Wt 259 lb (117.5 kg)    BMI 43.10 kg/m   Wt Readings from Last 3 Encounters:  08/09/21 259 lb (117.5 kg)  08/05/21 259 lb 8 oz (117.7 kg)  08/03/21 265 lb (120.2 kg)   Temp Readings from Last 3 Encounters:  08/05/21 97.9 F (36.6 C)  07/30/21 (!) 97 F (36.1 C) (Temporal)  07/23/21 (!) 97 F (36.1 C)   BP Readings from Last 3 Encounters:  08/09/21 126/72  08/05/21 121/78  07/30/21 122/75   Pulse Readings from Last 3 Encounters:  08/09/21 67  08/05/21 67  07/30/21 (!) 107     Well nourished, well developed female in no acute distress. Constitutional:  oriented to person, place, and time. No distress.  Head: Normocephalic and atraumatic.    ASSESSMENT & PLAN:    Problem List Items Addressed This Visit   None    COVID-19 Education: The signs and symptoms of COVID-19 were discussed with the patient and how to seek care for testing (follow up with PCP or arrange E-visit).  The importance of social distancing was discussed today.  Patient Risk:   After full review of this patients clinical status, I feel that they are at least moderate risk at this time.  Time:   Today, I have spent 25 minutes with the patient with telehealth technology discussing the cardiac and medical problems/diagnoses detailed above   Additional 10 min spent reviewing the chart prior to patient visit today   Medication Adjustments/Labs and Tests Ordered: Current medicines are reviewed at length with the patient today.  Concerns regarding medicines are outlined above.   Tests  Ordered: No tests ordered   Medication Changes: No changes made   Total encounter time more than 40 minutes  Greater than 50% was spent in counseling and coordination of care with the patient    Signed, Ida Rogue, [place] [address], [place], [place] [address]

## 2021-08-09 NOTE — Patient Instructions (Addendum)
Medication Instructions:  Please STOP hydralazine Monitor pressures If elevated, we may need 1/2 dose hydralazine (just call the office)  If you need a refill on your cardiac medications before your next appointment, please call your pharmacy.   Lab work: No new labs needed  Testing/Procedures: No new testing needed  Follow-Up: At [facility], you and your health needs are our priority.  As part of our continuing mission to provide you with exceptional heart care, we have created designated Provider Care Teams.  These Care Teams include your primary Cardiologist (physician) and Advanced Practice Providers (APPs -  Physician Assistants and Nurse Practitioners) who all work together to provide you with the care you need, when you need it.  You will need a follow up appointment in 3 months  Providers on your designated Care Team:   Murray Hodgkins, NP Christell Faith, PA-C Cadence Kathlen Mody, [place]  COVID-19 Vaccine Information can be found at: [email or web address].uk For questions related to vaccine distribution or appointments, please email vaccine@ [email or web address] or call [phone].

## 2021-08-10 ENCOUNTER — Telehealth: Payer: Self-pay

## 2021-08-10 ENCOUNTER — Other Ambulatory Visit: Payer: Self-pay | Admitting: Family

## 2021-08-10 DIAGNOSIS — F419 Anxiety disorder, unspecified: Secondary | ICD-10-CM

## 2021-08-10 NOTE — Telephone Encounter (Signed)
Pt informed of MD recommendation and follow up plan and verbalized understanding. She has appt for CT sim tomorrow (2/1). Added to reminder list for follow up appt reminder.

## 2021-08-10 NOTE — Telephone Encounter (Signed)
-----   Message from [name], MD sent at 08/09/2021 11:27 PM EST ----- Let her know that her heart function remains stable.  She has established care with [facility]. I recommend her to finish RT and follow up with me 2 weeks later to discuss resuming target therapy only at that time.  Please arrange MD visit 2 weeks after last RT.

## 2021-08-11 ENCOUNTER — Ambulatory Visit
Admission: RE | Admit: 2021-08-11 | Discharge: 2021-08-11 | Disposition: A | Discharge status: Home / Self Care | Payer: Medicare Other | Source: Ambulatory Visit | Attending: Radiation Oncology | Admitting: Radiation Oncology

## 2021-08-11 DIAGNOSIS — C50411 Malignant neoplasm of upper-outer quadrant of right female breast: Secondary | ICD-10-CM | POA: Diagnosis not present

## 2021-08-11 DIAGNOSIS — Z510 Encounter for antineoplastic radiation therapy: Secondary | ICD-10-CM | POA: Diagnosis not present

## 2021-08-11 DIAGNOSIS — D0511 Intraductal carcinoma in situ of right breast: Secondary | ICD-10-CM | POA: Insufficient documentation

## 2021-08-12 ENCOUNTER — Other Ambulatory Visit: Payer: Self-pay | Admitting: *Deleted

## 2021-08-12 DIAGNOSIS — C50919 Malignant neoplasm of unspecified site of unspecified female breast: Secondary | ICD-10-CM

## 2021-08-13 NOTE — Telephone Encounter (Signed)
Historical medication Last OV: 06/21/2021 Next OV: not scheduled

## 2021-08-16 DIAGNOSIS — D0511 Intraductal carcinoma in situ of right breast: Secondary | ICD-10-CM | POA: Diagnosis not present

## 2021-08-16 DIAGNOSIS — Z510 Encounter for antineoplastic radiation therapy: Secondary | ICD-10-CM | POA: Diagnosis not present

## 2021-08-16 DIAGNOSIS — C50411 Malignant neoplasm of upper-outer quadrant of right female breast: Secondary | ICD-10-CM | POA: Diagnosis not present

## 2021-08-17 ENCOUNTER — Ambulatory Visit: Admission: RE | Admit: 2021-08-17 | Payer: Medicare Other | Source: Ambulatory Visit

## 2021-08-17 DIAGNOSIS — Z510 Encounter for antineoplastic radiation therapy: Secondary | ICD-10-CM | POA: Diagnosis not present

## 2021-08-17 DIAGNOSIS — C50411 Malignant neoplasm of upper-outer quadrant of right female breast: Secondary | ICD-10-CM | POA: Diagnosis not present

## 2021-08-17 DIAGNOSIS — D0511 Intraductal carcinoma in situ of right breast: Secondary | ICD-10-CM | POA: Diagnosis not present

## 2021-08-18 ENCOUNTER — Other Ambulatory Visit: Payer: Self-pay | Admitting: Family

## 2021-08-18 ENCOUNTER — Ambulatory Visit
Admission: RE | Admit: 2021-08-18 | Discharge: 2021-08-18 | Disposition: A | Discharge status: Home / Self Care | Payer: Medicare Other | Source: Ambulatory Visit | Attending: Radiation Oncology | Admitting: Radiation Oncology

## 2021-08-18 ENCOUNTER — Other Ambulatory Visit: Payer: Self-pay | Admitting: Cardiovascular Disease

## 2021-08-18 DIAGNOSIS — I10 Essential (primary) hypertension: Secondary | ICD-10-CM

## 2021-08-18 DIAGNOSIS — D0511 Intraductal carcinoma in situ of right breast: Secondary | ICD-10-CM | POA: Diagnosis not present

## 2021-08-18 DIAGNOSIS — Z510 Encounter for antineoplastic radiation therapy: Secondary | ICD-10-CM | POA: Diagnosis not present

## 2021-08-18 DIAGNOSIS — C50411 Malignant neoplasm of upper-outer quadrant of right female breast: Secondary | ICD-10-CM | POA: Diagnosis not present

## 2021-08-18 NOTE — Telephone Encounter (Signed)
Patient scheduled for Final XRT on 09/15/21. Please schedule MD only follow up -2 weeks after that date and inform pt of appt.

## 2021-08-18 NOTE — Telephone Encounter (Signed)
Call pt to clarify medication  During our visit 06/21/21, we agreed   Decreased lasix 10mg  BID from 20mg  bid and ALSO decrease potassium chloride to 20 meq QD as on lower dose of lasix.   How is she taking lasix and potassium chloride?   Any changes since I last saw her?

## 2021-08-19 ENCOUNTER — Ambulatory Visit
Admission: RE | Admit: 2021-08-19 | Discharge: 2021-08-19 | Disposition: A | Discharge status: Home / Self Care | Payer: Medicare Other | Source: Ambulatory Visit | Attending: Radiation Oncology | Admitting: Radiation Oncology

## 2021-08-19 DIAGNOSIS — C50411 Malignant neoplasm of upper-outer quadrant of right female breast: Secondary | ICD-10-CM | POA: Diagnosis not present

## 2021-08-19 DIAGNOSIS — D0511 Intraductal carcinoma in situ of right breast: Secondary | ICD-10-CM | POA: Diagnosis not present

## 2021-08-19 DIAGNOSIS — Z510 Encounter for antineoplastic radiation therapy: Secondary | ICD-10-CM | POA: Diagnosis not present

## 2021-08-20 ENCOUNTER — Ambulatory Visit
Admission: RE | Admit: 2021-08-20 | Discharge: 2021-08-20 | Disposition: A | Discharge status: Home / Self Care | Payer: Medicare Other | Source: Ambulatory Visit | Attending: Radiation Oncology | Admitting: Radiation Oncology

## 2021-08-20 DIAGNOSIS — Z510 Encounter for antineoplastic radiation therapy: Secondary | ICD-10-CM | POA: Diagnosis not present

## 2021-08-20 DIAGNOSIS — C50411 Malignant neoplasm of upper-outer quadrant of right female breast: Secondary | ICD-10-CM | POA: Diagnosis not present

## 2021-08-20 DIAGNOSIS — D0511 Intraductal carcinoma in situ of right breast: Secondary | ICD-10-CM | POA: Diagnosis not present

## 2021-08-23 ENCOUNTER — Ambulatory Visit
Admission: RE | Admit: 2021-08-23 | Discharge: 2021-08-23 | Disposition: A | Discharge status: Home / Self Care | Payer: Medicare Other | Source: Ambulatory Visit | Attending: Radiation Oncology | Admitting: Radiation Oncology

## 2021-08-23 ENCOUNTER — Other Ambulatory Visit: Payer: Self-pay

## 2021-08-23 DIAGNOSIS — D0511 Intraductal carcinoma in situ of right breast: Secondary | ICD-10-CM | POA: Diagnosis not present

## 2021-08-23 DIAGNOSIS — C50411 Malignant neoplasm of upper-outer quadrant of right female breast: Secondary | ICD-10-CM | POA: Diagnosis not present

## 2021-08-23 DIAGNOSIS — Z510 Encounter for antineoplastic radiation therapy: Secondary | ICD-10-CM | POA: Diagnosis not present

## 2021-08-23 MED ORDER — POTASSIUM CHLORIDE CRYS ER 20 MEQ PO TBCR: 20.0000 meq | EXTENDED_RELEASE_TABLET | Freq: Every day | ORAL | 3 refills | Status: DC

## 2021-08-24 ENCOUNTER — Ambulatory Visit
Admission: RE | Admit: 2021-08-24 | Discharge: 2021-08-24 | Disposition: A | Discharge status: Home / Self Care | Payer: Medicare Other | Source: Ambulatory Visit | Attending: Radiation Oncology | Admitting: Radiation Oncology

## 2021-08-24 DIAGNOSIS — C50411 Malignant neoplasm of upper-outer quadrant of right female breast: Secondary | ICD-10-CM | POA: Diagnosis not present

## 2021-08-24 DIAGNOSIS — Z510 Encounter for antineoplastic radiation therapy: Secondary | ICD-10-CM | POA: Diagnosis not present

## 2021-08-24 DIAGNOSIS — D0511 Intraductal carcinoma in situ of right breast: Secondary | ICD-10-CM | POA: Diagnosis not present

## 2021-08-25 ENCOUNTER — Ambulatory Visit
Admission: RE | Admit: 2021-08-25 | Discharge: 2021-08-25 | Disposition: A | Discharge status: Home / Self Care | Payer: Medicare Other | Source: Ambulatory Visit | Attending: Radiation Oncology | Admitting: Radiation Oncology

## 2021-08-25 DIAGNOSIS — Z510 Encounter for antineoplastic radiation therapy: Secondary | ICD-10-CM | POA: Diagnosis not present

## 2021-08-25 DIAGNOSIS — D0511 Intraductal carcinoma in situ of right breast: Secondary | ICD-10-CM | POA: Diagnosis not present

## 2021-08-25 DIAGNOSIS — C50411 Malignant neoplasm of upper-outer quadrant of right female breast: Secondary | ICD-10-CM | POA: Diagnosis not present

## 2021-08-26 ENCOUNTER — Ambulatory Visit
Admission: RE | Admit: 2021-08-26 | Discharge: 2021-08-26 | Disposition: A | Discharge status: Home / Self Care | Payer: Medicare Other | Source: Ambulatory Visit | Attending: Radiation Oncology | Admitting: Radiation Oncology

## 2021-08-26 DIAGNOSIS — Z510 Encounter for antineoplastic radiation therapy: Secondary | ICD-10-CM | POA: Diagnosis not present

## 2021-08-26 DIAGNOSIS — D0511 Intraductal carcinoma in situ of right breast: Secondary | ICD-10-CM | POA: Diagnosis not present

## 2021-08-26 DIAGNOSIS — C50411 Malignant neoplasm of upper-outer quadrant of right female breast: Secondary | ICD-10-CM | POA: Diagnosis not present

## 2021-08-27 ENCOUNTER — Ambulatory Visit
Admission: RE | Admit: 2021-08-27 | Discharge: 2021-08-27 | Disposition: A | Discharge status: Home / Self Care | Payer: Medicare Other | Source: Ambulatory Visit | Attending: Radiation Oncology | Admitting: Radiation Oncology

## 2021-08-27 DIAGNOSIS — D0511 Intraductal carcinoma in situ of right breast: Secondary | ICD-10-CM | POA: Diagnosis not present

## 2021-08-27 DIAGNOSIS — C50411 Malignant neoplasm of upper-outer quadrant of right female breast: Secondary | ICD-10-CM | POA: Diagnosis not present

## 2021-08-27 DIAGNOSIS — Z510 Encounter for antineoplastic radiation therapy: Secondary | ICD-10-CM | POA: Diagnosis not present

## 2021-08-30 ENCOUNTER — Ambulatory Visit
Admission: RE | Admit: 2021-08-30 | Discharge: 2021-08-30 | Disposition: A | Discharge status: Home / Self Care | Payer: Medicare Other | Source: Ambulatory Visit | Attending: Radiation Oncology | Admitting: Radiation Oncology

## 2021-08-30 DIAGNOSIS — Z510 Encounter for antineoplastic radiation therapy: Secondary | ICD-10-CM | POA: Diagnosis not present

## 2021-08-30 DIAGNOSIS — D0511 Intraductal carcinoma in situ of right breast: Secondary | ICD-10-CM | POA: Diagnosis not present

## 2021-08-30 DIAGNOSIS — C50411 Malignant neoplasm of upper-outer quadrant of right female breast: Secondary | ICD-10-CM | POA: Diagnosis not present

## 2021-08-31 ENCOUNTER — Ambulatory Visit: Payer: Medicare Other

## 2021-08-31 ENCOUNTER — Ambulatory Visit
Admission: RE | Admit: 2021-08-31 | Discharge: 2021-08-31 | Disposition: A | Discharge status: Home / Self Care | Payer: Medicare Other | Source: Ambulatory Visit | Attending: Radiation Oncology | Admitting: Radiation Oncology

## 2021-08-31 DIAGNOSIS — Z510 Encounter for antineoplastic radiation therapy: Secondary | ICD-10-CM | POA: Diagnosis not present

## 2021-08-31 DIAGNOSIS — C50411 Malignant neoplasm of upper-outer quadrant of right female breast: Secondary | ICD-10-CM | POA: Diagnosis not present

## 2021-08-31 DIAGNOSIS — D0511 Intraductal carcinoma in situ of right breast: Secondary | ICD-10-CM | POA: Diagnosis not present

## 2021-09-01 ENCOUNTER — Other Ambulatory Visit: Payer: Self-pay

## 2021-09-01 ENCOUNTER — Inpatient Hospital Stay: Payer: Medicare Other

## 2021-09-01 ENCOUNTER — Ambulatory Visit
Admission: RE | Admit: 2021-09-01 | Discharge: 2021-09-01 | Disposition: A | Discharge status: Home / Self Care | Payer: Medicare Other | Source: Ambulatory Visit | Attending: Radiation Oncology | Admitting: Radiation Oncology

## 2021-09-01 DIAGNOSIS — Z95828 Presence of other vascular implants and grafts: Secondary | ICD-10-CM

## 2021-09-01 DIAGNOSIS — D0511 Intraductal carcinoma in situ of right breast: Secondary | ICD-10-CM | POA: Insufficient documentation

## 2021-09-01 DIAGNOSIS — C50411 Malignant neoplasm of upper-outer quadrant of right female breast: Secondary | ICD-10-CM | POA: Diagnosis not present

## 2021-09-01 DIAGNOSIS — Z510 Encounter for antineoplastic radiation therapy: Secondary | ICD-10-CM | POA: Diagnosis not present

## 2021-09-01 DIAGNOSIS — C50919 Malignant neoplasm of unspecified site of unspecified female breast: Secondary | ICD-10-CM

## 2021-09-01 LAB — CBC
HCT: 38.7 % (ref 36.0–46.0)
Hemoglobin: 12.4 g/dL (ref 12.0–15.0)
MCH: 29.6 pg (ref 26.0–34.0)
MCHC: 32 g/dL (ref 30.0–36.0)
MCV: 92.4 fL (ref 80.0–100.0)
Platelets: 198 10*3/uL (ref 150–400)
RBC: 4.19 MIL/uL (ref 3.87–5.11)
RDW: 13.9 % (ref 11.5–15.5)
WBC: 4.5 10*3/uL (ref 4.0–10.5)
nRBC: 0 % (ref 0.0–0.2)

## 2021-09-01 MED ORDER — HEPARIN SOD (PORK) LOCK FLUSH 100 UNIT/ML IV SOLN
500.0000 [IU] | Freq: Once | INTRAVENOUS | Status: AC
  Administered 2021-09-01: 500 [IU] via INTRAVENOUS
  Filled 2021-09-01: qty 5

## 2021-09-01 MED ORDER — SODIUM CHLORIDE 0.9% FLUSH
10.0000 mL | Freq: Once | INTRAVENOUS | Status: AC
  Administered 2021-09-01: 10 mL via INTRAVENOUS
  Filled 2021-09-01: qty 10

## 2021-09-02 ENCOUNTER — Ambulatory Visit
Admission: RE | Admit: 2021-09-02 | Discharge: 2021-09-02 | Disposition: A | Discharge status: Home / Self Care | Payer: Medicare Other | Source: Ambulatory Visit | Attending: Radiation Oncology | Admitting: Radiation Oncology

## 2021-09-02 DIAGNOSIS — C50411 Malignant neoplasm of upper-outer quadrant of right female breast: Secondary | ICD-10-CM | POA: Diagnosis not present

## 2021-09-02 DIAGNOSIS — D0511 Intraductal carcinoma in situ of right breast: Secondary | ICD-10-CM | POA: Diagnosis not present

## 2021-09-02 DIAGNOSIS — Z510 Encounter for antineoplastic radiation therapy: Secondary | ICD-10-CM | POA: Diagnosis not present

## 2021-09-03 ENCOUNTER — Ambulatory Visit
Admission: RE | Admit: 2021-09-03 | Discharge: 2021-09-03 | Disposition: A | Discharge status: Home / Self Care | Payer: Medicare Other | Source: Ambulatory Visit | Attending: Radiation Oncology | Admitting: Radiation Oncology

## 2021-09-03 DIAGNOSIS — D0511 Intraductal carcinoma in situ of right breast: Secondary | ICD-10-CM | POA: Diagnosis not present

## 2021-09-03 DIAGNOSIS — C50411 Malignant neoplasm of upper-outer quadrant of right female breast: Secondary | ICD-10-CM | POA: Diagnosis not present

## 2021-09-03 DIAGNOSIS — Z510 Encounter for antineoplastic radiation therapy: Secondary | ICD-10-CM | POA: Diagnosis not present

## 2021-09-06 ENCOUNTER — Ambulatory Visit
Admission: RE | Admit: 2021-09-06 | Discharge: 2021-09-06 | Disposition: A | Discharge status: Home / Self Care | Payer: Medicare Other | Source: Ambulatory Visit | Attending: Radiation Oncology | Admitting: Radiation Oncology

## 2021-09-06 DIAGNOSIS — Z510 Encounter for antineoplastic radiation therapy: Secondary | ICD-10-CM | POA: Diagnosis not present

## 2021-09-06 DIAGNOSIS — C50411 Malignant neoplasm of upper-outer quadrant of right female breast: Secondary | ICD-10-CM | POA: Diagnosis not present

## 2021-09-06 DIAGNOSIS — D0511 Intraductal carcinoma in situ of right breast: Secondary | ICD-10-CM | POA: Diagnosis not present

## 2021-09-07 ENCOUNTER — Ambulatory Visit
Admission: RE | Admit: 2021-09-07 | Discharge: 2021-09-07 | Disposition: A | Discharge status: Home / Self Care | Payer: Medicare Other | Source: Ambulatory Visit | Attending: Radiation Oncology | Admitting: Radiation Oncology

## 2021-09-07 DIAGNOSIS — I509 Heart failure, unspecified: Secondary | ICD-10-CM | POA: Diagnosis not present

## 2021-09-07 DIAGNOSIS — D0511 Intraductal carcinoma in situ of right breast: Secondary | ICD-10-CM | POA: Diagnosis not present

## 2021-09-07 DIAGNOSIS — J449 Chronic obstructive pulmonary disease, unspecified: Secondary | ICD-10-CM | POA: Diagnosis not present

## 2021-09-07 DIAGNOSIS — M1712 Unilateral primary osteoarthritis, left knee: Secondary | ICD-10-CM | POA: Diagnosis not present

## 2021-09-07 DIAGNOSIS — C50411 Malignant neoplasm of upper-outer quadrant of right female breast: Secondary | ICD-10-CM | POA: Diagnosis not present

## 2021-09-07 DIAGNOSIS — C50911 Malignant neoplasm of unspecified site of right female breast: Secondary | ICD-10-CM | POA: Diagnosis not present

## 2021-09-07 DIAGNOSIS — G629 Polyneuropathy, unspecified: Secondary | ICD-10-CM | POA: Diagnosis not present

## 2021-09-07 DIAGNOSIS — Z510 Encounter for antineoplastic radiation therapy: Secondary | ICD-10-CM | POA: Diagnosis not present

## 2021-09-08 ENCOUNTER — Ambulatory Visit
Admission: RE | Admit: 2021-09-08 | Discharge: 2021-09-08 | Disposition: A | Discharge status: Home / Self Care | Payer: Medicare Other | Source: Ambulatory Visit | Attending: Radiation Oncology | Admitting: Radiation Oncology

## 2021-09-08 DIAGNOSIS — C50411 Malignant neoplasm of upper-outer quadrant of right female breast: Secondary | ICD-10-CM | POA: Diagnosis not present

## 2021-09-08 DIAGNOSIS — Z510 Encounter for antineoplastic radiation therapy: Secondary | ICD-10-CM | POA: Insufficient documentation

## 2021-09-09 ENCOUNTER — Ambulatory Visit
Admission: RE | Admit: 2021-09-09 | Discharge: 2021-09-09 | Disposition: A | Discharge status: Home / Self Care | Payer: Medicare Other | Source: Ambulatory Visit | Attending: Radiation Oncology | Admitting: Radiation Oncology

## 2021-09-09 DIAGNOSIS — C50411 Malignant neoplasm of upper-outer quadrant of right female breast: Secondary | ICD-10-CM | POA: Diagnosis not present

## 2021-09-09 DIAGNOSIS — Z510 Encounter for antineoplastic radiation therapy: Secondary | ICD-10-CM | POA: Diagnosis not present

## 2021-09-10 ENCOUNTER — Ambulatory Visit
Admission: RE | Admit: 2021-09-10 | Discharge: 2021-09-10 | Disposition: A | Discharge status: Home / Self Care | Payer: Medicare Other | Source: Ambulatory Visit | Attending: Radiation Oncology | Admitting: Radiation Oncology

## 2021-09-10 ENCOUNTER — Other Ambulatory Visit: Payer: Self-pay | Admitting: Cardiovascular Disease

## 2021-09-10 DIAGNOSIS — Z510 Encounter for antineoplastic radiation therapy: Secondary | ICD-10-CM | POA: Diagnosis not present

## 2021-09-10 DIAGNOSIS — C50411 Malignant neoplasm of upper-outer quadrant of right female breast: Secondary | ICD-10-CM | POA: Diagnosis not present

## 2021-09-13 ENCOUNTER — Ambulatory Visit
Admission: RE | Admit: 2021-09-13 | Discharge: 2021-09-13 | Disposition: A | Discharge status: Home / Self Care | Payer: Medicare Other | Source: Ambulatory Visit | Attending: Radiation Oncology | Admitting: Radiation Oncology

## 2021-09-13 DIAGNOSIS — C50411 Malignant neoplasm of upper-outer quadrant of right female breast: Secondary | ICD-10-CM | POA: Diagnosis not present

## 2021-09-13 DIAGNOSIS — Z510 Encounter for antineoplastic radiation therapy: Secondary | ICD-10-CM | POA: Diagnosis not present

## 2021-09-14 ENCOUNTER — Ambulatory Visit
Admission: RE | Admit: 2021-09-14 | Discharge: 2021-09-14 | Disposition: A | Discharge status: Home / Self Care | Payer: Medicare Other | Source: Ambulatory Visit | Attending: Radiation Oncology | Admitting: Radiation Oncology

## 2021-09-14 ENCOUNTER — Other Ambulatory Visit: Payer: Self-pay | Admitting: *Deleted

## 2021-09-14 DIAGNOSIS — C50411 Malignant neoplasm of upper-outer quadrant of right female breast: Secondary | ICD-10-CM | POA: Diagnosis not present

## 2021-09-14 DIAGNOSIS — Z510 Encounter for antineoplastic radiation therapy: Secondary | ICD-10-CM | POA: Diagnosis not present

## 2021-09-14 MED ORDER — SILVER SULFADIAZINE 1 % EX CREA: 1.0000 "application " | TOPICAL_CREAM | Freq: Two times a day (BID) | CUTANEOUS | 3 refills | Status: DC

## 2021-09-15 ENCOUNTER — Ambulatory Visit
Admission: RE | Admit: 2021-09-15 | Discharge: 2021-09-15 | Disposition: A | Discharge status: Home / Self Care | Payer: Medicare Other | Source: Ambulatory Visit | Attending: Radiation Oncology | Admitting: Radiation Oncology

## 2021-09-15 DIAGNOSIS — C50411 Malignant neoplasm of upper-outer quadrant of right female breast: Secondary | ICD-10-CM | POA: Diagnosis not present

## 2021-09-15 DIAGNOSIS — Z510 Encounter for antineoplastic radiation therapy: Secondary | ICD-10-CM | POA: Diagnosis not present

## 2021-09-29 ENCOUNTER — Inpatient Hospital Stay: Payer: Medicare Other | Attending: Oncology | Admitting: Oncology

## 2021-09-29 ENCOUNTER — Encounter: Payer: Self-pay | Admitting: Oncology

## 2021-09-29 ENCOUNTER — Other Ambulatory Visit: Payer: Self-pay

## 2021-09-29 VITALS — BP 128/78 | HR 55 | Temp 97.8°F | Resp 18 | Ht 65.0 in | Wt 258.7 lb

## 2021-09-29 DIAGNOSIS — I110 Hypertensive heart disease with heart failure: Secondary | ICD-10-CM | POA: Insufficient documentation

## 2021-09-29 DIAGNOSIS — Z87891 Personal history of nicotine dependence: Secondary | ICD-10-CM | POA: Insufficient documentation

## 2021-09-29 DIAGNOSIS — Z5112 Encounter for antineoplastic immunotherapy: Secondary | ICD-10-CM | POA: Insufficient documentation

## 2021-09-29 DIAGNOSIS — C50919 Malignant neoplasm of unspecified site of unspecified female breast: Secondary | ICD-10-CM

## 2021-09-29 DIAGNOSIS — Z8041 Family history of malignant neoplasm of ovary: Secondary | ICD-10-CM | POA: Diagnosis not present

## 2021-09-29 DIAGNOSIS — Z800 Family history of malignant neoplasm of digestive organs: Secondary | ICD-10-CM | POA: Insufficient documentation

## 2021-09-29 DIAGNOSIS — Z803 Family history of malignant neoplasm of breast: Secondary | ICD-10-CM | POA: Insufficient documentation

## 2021-09-29 DIAGNOSIS — M5412 Radiculopathy, cervical region: Secondary | ICD-10-CM | POA: Insufficient documentation

## 2021-09-29 DIAGNOSIS — Z806 Family history of leukemia: Secondary | ICD-10-CM | POA: Diagnosis not present

## 2021-09-29 DIAGNOSIS — Z171 Estrogen receptor negative status [ER-]: Secondary | ICD-10-CM | POA: Diagnosis not present

## 2021-09-29 DIAGNOSIS — Z90710 Acquired absence of both cervix and uterus: Secondary | ICD-10-CM | POA: Diagnosis not present

## 2021-09-29 DIAGNOSIS — C50411 Malignant neoplasm of upper-outer quadrant of right female breast: Secondary | ICD-10-CM | POA: Diagnosis not present

## 2021-09-29 DIAGNOSIS — I509 Heart failure, unspecified: Secondary | ICD-10-CM | POA: Insufficient documentation

## 2021-09-29 DIAGNOSIS — Z7189 Other specified counseling: Secondary | ICD-10-CM

## 2021-09-29 DIAGNOSIS — G629 Polyneuropathy, unspecified: Secondary | ICD-10-CM | POA: Insufficient documentation

## 2021-09-29 NOTE — Progress Notes (Signed)
Pt here for follow up. No new concerns voiced.   

## 2021-09-29 NOTE — Progress Notes (Signed)
?Hematology/Oncology Progress note ?[phone] [phone] ?  ? ? ? ?Patient Care Team: ?Amber Hawthorne, FNP as PCP - General (Family Medicine) ?Amber Merritts, MD as PCP - Cardiology (Cardiology) ?Amber Merritts, MD as Consulting Physician (Cardiology) ?Amber Junker, RN as Oncology Nurse Navigator ?Amber Server, MD as Consulting Physician (Hematology and Oncology) ?Amber Co, MD as Consulting Physician (Urology) ?Amber Filbert, MD as Consulting Physician (Radiation Oncology) ?Amber Bacon, MD as Consulting Physician (General Surgery) ? ?REFERRING PROVIDER: ?Amber Hawthorne, FNP  ?CHIEF COMPLAINTS/REASON FOR VISIT:  ?Follow-up for stage I HER2 positive breast cancer ? ?HISTORY OF PRESENTING ILLNESS:  ? ?Amber Barnes is a  71 y.o.  female with PMH listed below was seen in consultation at the request of  Amber Hawthorne, FNP  for evaluation of weight loss, family history ovarian cancer. ?Patient reports a history of nausea vomiting, early satiety, dysphagia symptoms that started in March.  She has lost significant amount of weight as well. ?Patient weighed 340 pounds in February 2021, 309 pounds in May 2021 and currently she weighs 296 pounds today. ?Patient has tried Zofran and scopolamine patch which did not relieve her symptoms. ?11/05/2019, CT abdomen pelvis showed chronic changes with out acute abnormality to correspond with the patient's symptoms. ?Patient has had right upper quadrant ultrasound done on 11/14/2019, which showed hepatic steatosis with no other acute abnormality. ?Patient was seen by gastroenterology Dr. Alice Barnes on 11/18/2019.  Patient was recommended to take PPI daily.  Added Carafate.  H. pylori testing came back positive and the patient was started on a course of tetracycline, metronidazole, Pepto-Bismol for H. pylori treatments.  Patient reports that she has been on the treatment for 7 days now.  Nausea is slightly better but still persistent.   She is following up with gastroenterology next week for discussion of EGD. ?Currently she is not able to tolerate colonoscopy prep due to the nausea.  Her stool was tested positive for occult blood. ?She also reports night sweating which is a chronic symptom for her. ?Patient was last seen on 05/20/2020 for evaluation of weight loss, family history of ovarian cancer patient was referred to establish care with genetic counselor.  Patient eventually declined genetic testing. ? ?12/30/2020, patient had a screening mammogram done which showed possible mass and asymmetry in the right breast. ?01/08/2021, diagnostic mammogram of the right breast showed 2 adjacent irregular spiculated masses.  Larger 1 was 10 mm and a smaller mass measured 5 mm. ?01/15/2021 stereotactic biopsy of the right breast smaller mass showed invasive carcinoma no special type, grade 2, DCIS present, LVI negative. ?Ultrasound guided biopsy of the larger mass showed invasive mammary carcinoma,  ? ?Patient has appointment with surgery Dr. Christian Barnes later this week.  She was referred to establish care with oncology for evaluation.  She was accompanied by daughter. ?She has no new complaints. ? ?Family history of breast cancer: Denies ?Family history of other cancers: Patient has a strong family history of leukemia in her mother, colon cancer in her father at age of 25, ovarian cancer in her sister at age of 37, CLL in her brother ? ?02/17/2021, patient is status post lumpectomy and sentinel lymph node biopsy ?Pathology showed invasive mammary carcinoma, 2 separate foci, DCIS, negative sentinel lymph node biopsy 0/1, grade 3, or margins negative for invasive carcinoma.  ER -, PR + [1-10%], HER2 +  pT1b(m) pN0  ? ?# Strong family history of cancer, Discussed again about genetic testing. patient adamantly  declined. ? ?#  Intermittent dizziness which per patient is chronic for her due to the vertigo. ?#03/23/2021 Mediport by vascular surgeon ?#History of  CHF, ?03/08/2021, echocardiogram showed LVEF 60-65%. ?Per patient, she has been seen by Amber Barnes team and was cleared for proceeding with chemotherapy.  ? ?03/24/2021 - 03/31/2021 Taxol 80 mg/m2 Amber Barnes ?04/05/2021 - 04/09/2021 patient was admitted due to neutropenic fever with associated SIRS.  Patient was treated with empiric cefepime and vancomycin until date of discharge when [place] recovered to 1.4. ? ?Post hospitalization, patient also had COVID-19 infection so her follow-up appointment was further postponed for another 2 weeks. ? ?05/10/2021 resumed dose reduce Taxol 59m/m2 /Amber Barnes?05/25/2021-06/30/2021, cycle 2 dose reduced Taxol 684mm2 /tAmie Barnes ?admitted from 07/12/2021 - 07/17/2021 due to acute pyelonephritis UVJ obstruction and a right hydro ureteralnephrosis.  Patient was treated with IV antibiotics status post emergent bilateral ureteral stent placement.  Discharged home with a course of oral antibiotics. ? ? ? ?INTERVAL HISTORY ?Amber Barnes a [age].o. female who has above history reviewed by me today presents for follow up visit for management of right side HER 2 positive breast cancer ? ?08/18/2021 - 09/08/2021, status post adjuvant radiation.  Patient had experienced skin toxicities from radiation.  Symptoms are better since the completion of radiation. ? ?Today she presented to follow-up in the clinic and discussed about future plan. ? ? ?Review of Systems  ?Constitutional:  Positive for fatigue. Negative for appetite change, chills, fever and unexpected weight change.  ?     Patient sits in the wheelchair  ?HENT:   Negative for hearing loss and voice change.   ?Respiratory:  Negative for chest tightness and cough.   ?Cardiovascular:  Negative for chest pain.  ?Gastrointestinal:  Negative for abdominal distention, abdominal pain, blood in stool, nausea and vomiting.  ?Endocrine: Negative for hot flashes.  ?Genitourinary:  Negative for difficulty urinating, frequency and hematuria.    ?Musculoskeletal:  Negative for arthralgias.  ?Skin:  Negative for itching and rash.  ?Neurological:  Positive for numbness. Negative for dizziness and extremity weakness.  ?Hematological:  Negative for adenopathy.  ?Psychiatric/Behavioral:  Negative for confusion.   ? ?MEDICAL HISTORY:  ?Past Medical History:  ?Diagnosis Date  ? Achilles tendinitis   ? Acute medial meniscal tear   ? Allergy   ? Anxiety   ? Atrial fibrillation ([facility]  ? a. new onset 07/2016; b. CHADS2VASc --> 4 (HTN, female, age, CHF); c. eliquis  ? CHF (congestive heart failure) ([facility]  ? Chronic anticoagulation   ? Apixaban  ? Chronic lower back pain   ? Colon polyp   ? Complication of anesthesia   ? Emergence delirium; hypoxia  ? COPD (chronic obstructive pulmonary disease) ([facility]  ? Cystic breast   ? Depression   ? Endometriosis   ? Fibrocystic breast disease   ? GERD (gastroesophageal reflux disease)   ? History of stress test   ? a. 07/2016 MV: EF 55-65%. Small, mild defect  in mid anteroseptal, apical septal, and apical locations. No ischemia-->low risk.  ? Hyperlipidemia   ? Hypertension   ? Insomnia   ? Lipoma   ? Malignant neoplasm of upper-outer quadrant of right female breast ([facility]  ? Stage 1A invasive mammary carcinoma (cT1b or T1c N0); ER (-), PR (+), HER2/neu (+)  ? Migraine   ? Mild Mitral regurgitation   ? a. 07/2016 Echo: EF 55-65%, no rwma, mild MR. Mildly dil LA. Nl RV fx.   ?  Nephrolithiasis   ? OSA (obstructive sleep apnea)   ? non-compliant with nocturnal PAP therapy  ? Osteoarthritis   ? left knee  ? Osteopenia   ? Peptic ulcer disease   ? a. H. pylori  ? Pneumonia   ? RLS (restless legs syndrome)   ? Tendonitis   ? Thyroid disease   ? Vitamin D deficiency   ? ? ?SURGICAL HISTORY: ?Past Surgical History:  ?Procedure Laterality Date  ? ABDOMINAL HYSTERECTOMY    ? ovaries left  ? ACHILLES TENDON SURGERY    ? 2012,01/2017  ? BACK SURGERY    ? fusion lumbar  ? BREAST BIOPSY Right 01/15/2021  ? Affirm biopsy-"X" clip-INVASIVE  MAMMARY CARCINOMA  ? BREAST BIOPSY Right 01/15/2021  ? u/s bx-"venus" clip INVASIVE MAMMARY CARCINOMA  ? BREAST BIOPSY    ? BREAST LUMPECTOMY,RADIO FREQ LOCALIZER,AXILLARY SENTINEL LYMPH NODE BIOPSY Right 02/17/2021

## 2021-10-05 ENCOUNTER — Other Ambulatory Visit: Payer: Self-pay | Admitting: Family

## 2021-10-05 DIAGNOSIS — K760 Fatty (change of) liver, not elsewhere classified: Secondary | ICD-10-CM

## 2021-10-06 ENCOUNTER — Other Ambulatory Visit: Payer: Self-pay

## 2021-10-06 ENCOUNTER — Inpatient Hospital Stay: Payer: Medicare Other

## 2021-10-06 VITALS — BP 147/84 | HR 61 | Temp 96.4°F | Resp 20 | Wt 260.3 lb

## 2021-10-06 DIAGNOSIS — Z87891 Personal history of nicotine dependence: Secondary | ICD-10-CM | POA: Diagnosis not present

## 2021-10-06 DIAGNOSIS — I110 Hypertensive heart disease with heart failure: Secondary | ICD-10-CM | POA: Diagnosis not present

## 2021-10-06 DIAGNOSIS — I509 Heart failure, unspecified: Secondary | ICD-10-CM | POA: Diagnosis not present

## 2021-10-06 DIAGNOSIS — G629 Polyneuropathy, unspecified: Secondary | ICD-10-CM | POA: Diagnosis not present

## 2021-10-06 DIAGNOSIS — C50411 Malignant neoplasm of upper-outer quadrant of right female breast: Secondary | ICD-10-CM | POA: Diagnosis not present

## 2021-10-06 DIAGNOSIS — Z800 Family history of malignant neoplasm of digestive organs: Secondary | ICD-10-CM | POA: Diagnosis not present

## 2021-10-06 DIAGNOSIS — C50919 Malignant neoplasm of unspecified site of unspecified female breast: Secondary | ICD-10-CM

## 2021-10-06 DIAGNOSIS — Z171 Estrogen receptor negative status [ER-]: Secondary | ICD-10-CM | POA: Diagnosis not present

## 2021-10-06 DIAGNOSIS — Z803 Family history of malignant neoplasm of breast: Secondary | ICD-10-CM | POA: Diagnosis not present

## 2021-10-06 DIAGNOSIS — Z806 Family history of leukemia: Secondary | ICD-10-CM | POA: Diagnosis not present

## 2021-10-06 DIAGNOSIS — M5412 Radiculopathy, cervical region: Secondary | ICD-10-CM | POA: Diagnosis not present

## 2021-10-06 DIAGNOSIS — Z5112 Encounter for antineoplastic immunotherapy: Secondary | ICD-10-CM | POA: Diagnosis not present

## 2021-10-06 LAB — COMPREHENSIVE METABOLIC PANEL
ALT: 13 U/L (ref 0–44)
AST: 18 U/L (ref 15–41)
Albumin: 3.6 g/dL (ref 3.5–5.0)
Alkaline Phosphatase: 66 U/L (ref 38–126)
Anion gap: 6 (ref 5–15)
BUN: 15 mg/dL (ref 8–23)
CO2: 26 mmol/L (ref 22–32)
Calcium: 8.6 mg/dL — ABNORMAL LOW (ref 8.9–10.3)
Chloride: 104 mmol/L (ref 98–111)
Creatinine, Ser: 0.82 mg/dL (ref 0.44–1.00)
GFR, Estimated: >60 mL/min (ref >60)
Glucose, Bld: 102 mg/dL — ABNORMAL HIGH (ref 70–99)
Potassium: 3.8 mmol/L (ref 3.5–5.1)
Sodium: 136 mmol/L (ref 135–145)
Total Bilirubin: 0.5 mg/dL (ref 0.3–1.2)
Total Protein: 6.6 g/dL (ref 6.5–8.1)

## 2021-10-06 LAB — CBC WITH DIFFERENTIAL/PLATELET
Abs Immature Granulocytes: 0.01 10*3/uL (ref 0.00–0.07)
Basophils Absolute: 0 10*3/uL (ref 0.0–0.1)
Basophils Relative: 0 %
Eosinophils Absolute: 0.1 10*3/uL (ref 0.0–0.5)
Eosinophils Relative: 2 %
HCT: 37.2 % (ref 36.0–46.0)
Hemoglobin: 12.3 g/dL (ref 12.0–15.0)
Immature Granulocytes: 0 %
Lymphocytes Relative: 26 %
Lymphs Abs: 1 10*3/uL (ref 0.7–4.0)
MCH: 30.1 pg (ref 26.0–34.0)
MCHC: 33.1 g/dL (ref 30.0–36.0)
MCV: 91 fL (ref 80.0–100.0)
Monocytes Absolute: 0.3 10*3/uL (ref 0.1–1.0)
Monocytes Relative: 6 %
Neutro Abs: 2.6 10*3/uL (ref 1.7–7.7)
Neutrophils Relative %: 66 %
Platelets: 161 10*3/uL (ref 150–400)
RBC: 4.09 MIL/uL (ref 3.87–5.11)
RDW: 14 % (ref 11.5–15.5)
WBC: 4 10*3/uL (ref 4.0–10.5)
nRBC: 0 % (ref 0.0–0.2)

## 2021-10-06 MED ORDER — DIPHENHYDRAMINE HCL 25 MG PO CAPS
50.0000 mg | ORAL_CAPSULE | Freq: Once | ORAL | Status: AC
  Administered 2021-10-06: 50 mg via ORAL
  Filled 2021-10-06: qty 2

## 2021-10-06 MED ORDER — HEPARIN SOD (PORK) LOCK FLUSH 100 UNIT/ML IV SOLN
500.0000 [IU] | Freq: Once | INTRAVENOUS | Status: AC | PRN
  Filled 2021-10-06: qty 5

## 2021-10-06 MED ORDER — TRASTUZUMAB-ANNS CHEMO 150 MG IV SOLR
6.0000 mg/kg | Freq: Once | INTRAVENOUS | Status: AC
  Administered 2021-10-06: 777 mg via INTRAVENOUS
  Filled 2021-10-06: qty 37

## 2021-10-06 MED ORDER — ACETAMINOPHEN 325 MG PO TABS
650.0000 mg | ORAL_TABLET | Freq: Once | ORAL | Status: AC
  Administered 2021-10-06: 650 mg via ORAL
  Filled 2021-10-06: qty 2

## 2021-10-06 MED ORDER — HEPARIN SOD (PORK) LOCK FLUSH 100 UNIT/ML IV SOLN
INTRAVENOUS | Status: AC
  Administered 2021-10-06: 500 [IU]
  Filled 2021-10-06: qty 5

## 2021-10-06 MED ORDER — SODIUM CHLORIDE 0.9 % IV SOLN
Freq: Once | INTRAVENOUS | Status: AC
  Filled 2021-10-06: qty 250

## 2021-10-06 NOTE — Progress Notes (Signed)
NO reload for trastuzumab per MD. ? ?T.O Dr Laney Potash, PharmD ?

## 2021-10-06 NOTE — Patient Instructions (Signed)
[facility] CANCER CTR AT [place]  Discharge Instructions: ?Thank you for choosing [place] to provide your oncology and hematology care.  ?If you have a lab appointment with the [place], please go directly to the [place] and check in at the registration area. ? ?Wear comfortable clothing and clothing appropriate for easy access to any Portacath or PICC line.  ? ?We strive to give you quality time with your provider. You may need to reschedule your appointment if you arrive late (15 or more minutes).  Arriving late affects you and other patients whose appointments are after yours.  Also, if you miss three or more appointments without notifying the office, you may be dismissed from the clinic at the provider?s discretion.    ?  ?For prescription refill requests, have your pharmacy contact our office and allow 72 hours for refills to be completed.   ? ?Today you received the following chemotherapy and/or immunotherapy agents Trastuzumab    ?  ?To help prevent nausea and vomiting after your treatment, we encourage you to take your nausea medication as directed. ? ?BELOW ARE SYMPTOMS THAT SHOULD BE REPORTED IMMEDIATELY: ?*FEVER GREATER THAN 100.4 F (38 ?C) OR HIGHER ?*CHILLS OR SWEATING ?*NAUSEA AND VOMITING THAT IS NOT CONTROLLED WITH YOUR NAUSEA MEDICATION ?*UNUSUAL SHORTNESS OF BREATH ?*UNUSUAL BRUISING OR BLEEDING ?*URINARY PROBLEMS (pain or burning when urinating, or frequent urination) ?*BOWEL PROBLEMS (unusual diarrhea, constipation, pain near the anus) ?TENDERNESS IN MOUTH AND THROAT WITH OR WITHOUT PRESENCE OF ULCERS (sore throat, sores in mouth, or a toothache) ?UNUSUAL RASH, SWELLING OR PAIN  ?UNUSUAL VAGINAL DISCHARGE OR ITCHING  ? ?Items with * indicate a potential emergency and should be followed up as soon as possible or go to the Emergency Department if any problems should occur. ? ?Please show the CHEMOTHERAPY ALERT CARD or IMMUNOTHERAPY ALERT CARD at check-in to  the Emergency Department and triage nurse. ? ?Should you have questions after your visit or need to cancel or reschedule your appointment, please contact [facility] CANCER [place] AT [employer]  [phone] and follow the prompts.  Office hours are 8:00 a.m. to 4:30 p.m. Monday - Friday. Please note that voicemails left after 4:00 p.m. may not be returned until the following business day.  We are closed weekends and major holidays. You have access to a nurse at all times for urgent questions. Please call the main number to the clinic [phone] and follow the prompts. ? ?For any non-urgent questions, you may also contact your provider using MyChart. We now offer e-Visits for anyone 72 and older to request care online for non-urgent symptoms. For details visit [email or web address]. ?  ?Also download the MyChart app! Go to the app store, search "MyChart", open the app, select [place], and log in with your MyChart username and password. ? ?Due to Covid, a mask is required upon entering the hospital/clinic. If you do not have a mask, one will be given to you upon arrival. For doctor visits, patients may have 1 support person aged 92 or older with them. For treatment visits, patients cannot have anyone with them due to current Covid guidelines and our immunocompromised population.  ?

## 2021-10-07 DIAGNOSIS — M1712 Unilateral primary osteoarthritis, left knee: Secondary | ICD-10-CM | POA: Diagnosis not present

## 2021-10-07 DIAGNOSIS — C50911 Malignant neoplasm of unspecified site of right female breast: Secondary | ICD-10-CM | POA: Diagnosis not present

## 2021-10-07 DIAGNOSIS — J449 Chronic obstructive pulmonary disease, unspecified: Secondary | ICD-10-CM | POA: Diagnosis not present

## 2021-10-07 DIAGNOSIS — I509 Heart failure, unspecified: Secondary | ICD-10-CM | POA: Diagnosis not present

## 2021-10-07 DIAGNOSIS — G629 Polyneuropathy, unspecified: Secondary | ICD-10-CM | POA: Diagnosis not present

## 2021-10-09 ENCOUNTER — Other Ambulatory Visit: Payer: Self-pay | Admitting: Family

## 2021-10-09 DIAGNOSIS — F419 Anxiety disorder, unspecified: Secondary | ICD-10-CM

## 2021-10-20 ENCOUNTER — Ambulatory Visit
Admission: RE | Admit: 2021-10-20 | Discharge: 2021-10-20 | Disposition: A | Discharge status: Home / Self Care | Payer: Medicare Other | Source: Ambulatory Visit | Attending: Radiation Oncology | Admitting: Radiation Oncology

## 2021-10-20 ENCOUNTER — Encounter: Payer: Self-pay | Admitting: Radiation Oncology

## 2021-10-20 VITALS — BP 153/75 | HR 55 | Temp 96.7°F | Ht 65.0 in

## 2021-10-20 DIAGNOSIS — C50919 Malignant neoplasm of unspecified site of unspecified female breast: Secondary | ICD-10-CM

## 2021-10-20 DIAGNOSIS — C50411 Malignant neoplasm of upper-outer quadrant of right female breast: Secondary | ICD-10-CM | POA: Insufficient documentation

## 2021-10-20 DIAGNOSIS — Z923 Personal history of irradiation: Secondary | ICD-10-CM | POA: Diagnosis not present

## 2021-10-20 DIAGNOSIS — Z170 Estrogen receptor positive status [ER+]: Secondary | ICD-10-CM | POA: Insufficient documentation

## 2021-10-20 NOTE — Progress Notes (Signed)
Radiation Oncology ?Follow up Note ? ?Name: Amber Barnes   ?Date:   10/20/2021 ?MRN:  [id number] ?DOB: Aug 27, 1950  ? ? ?This 71 y.o. female presents to the clinic today for 1 month follow-up status post whole breast radiation to her right breast for stage Ia (T1b N0 M0) HER2/neu positive invasive mammary carcinoma. ? ?REFERRING PROVIDER: [name], FNP ? ?HPI: Patient is a 71 year old female now at 1 month having completed whole breast radiation to her right breast for stage Ia HER2/neu positive base of mammary carcinoma.  Seen today in routine follow-up she is doing well she is receiving Herceptin infusions.  She specifically denies breast tenderness cough or bone pain.. ? ?COMPLICATIONS OF TREATMENT: none ? ?FOLLOW UP COMPLIANCE: keeps appointments  ? ?PHYSICAL EXAM:  ?BP (!) 153/75   Pulse (!) 55   Temp (!) 96.7 ?F (35.9 ?C)   Ht 5' 5"  (1.651 m)   BMI 43.31 kg/m?  ?Lungs are clear to A&P cardiac examination essentially unremarkable with regular rate and rhythm. No dominant mass or nodularity is noted in either breast in 2 positions examined. Incision is well-healed. No axillary or supraclavicular adenopathy is appreciated. Cosmetic result is excellent.  Well-developed well-nourished patient in NAD. HEENT reveals PERLA, EOMI, discs not visualized.  Oral cavity is clear. No oral mucosal lesions are identified. Neck is clear without evidence of cervical or supraclavicular adenopathy. Lungs are clear to A&P. Cardiac examination is essentially unremarkable with regular rate and rhythm without murmur rub or thrill. Abdomen is benign with no organomegaly or masses noted. Motor sensory and DTR levels are equal and symmetric in the upper and lower extremities. Cranial nerves II through XII are grossly intact. Proprioception is intact. No peripheral adenopathy or edema is identified. No motor or sensory levels are noted. Crude visual fields are within normal range. ? ?RADIOLOGY RESULTS: No current films to  review ? ?PLAN: Present time patient is doing well 1 month out from whole breast radiation low side effect profile.  She continues with Herceptin infusion.  I have asked to see her back in 4 to 5 months for follow-up.  Patient and family know to call with any concerns. ? ?I would like to take this opportunity to thank you for allowing me to participate in the care of your patient.. ?  ? Noreene Filbert, MD ? ?

## 2021-10-25 ENCOUNTER — Other Ambulatory Visit: Payer: Self-pay | Admitting: Family

## 2021-10-25 DIAGNOSIS — F32A Depression, unspecified: Secondary | ICD-10-CM

## 2021-10-27 ENCOUNTER — Inpatient Hospital Stay: Payer: Medicare Other

## 2021-10-27 ENCOUNTER — Inpatient Hospital Stay: Payer: Medicare Other | Attending: Oncology

## 2021-10-27 ENCOUNTER — Inpatient Hospital Stay (HOSPITAL_BASED_OUTPATIENT_CLINIC_OR_DEPARTMENT_OTHER): Payer: Medicare Other | Admitting: Oncology

## 2021-10-27 ENCOUNTER — Encounter: Payer: Self-pay | Admitting: Oncology

## 2021-10-27 VITALS — BP 155/83 | HR 52 | Temp 97.8°F | Resp 18 | Wt 260.9 lb

## 2021-10-27 DIAGNOSIS — C50919 Malignant neoplasm of unspecified site of unspecified female breast: Secondary | ICD-10-CM

## 2021-10-27 DIAGNOSIS — M7989 Other specified soft tissue disorders: Secondary | ICD-10-CM | POA: Insufficient documentation

## 2021-10-27 DIAGNOSIS — Z171 Estrogen receptor negative status [ER-]: Secondary | ICD-10-CM | POA: Insufficient documentation

## 2021-10-27 DIAGNOSIS — Z5112 Encounter for antineoplastic immunotherapy: Secondary | ICD-10-CM

## 2021-10-27 DIAGNOSIS — C50411 Malignant neoplasm of upper-outer quadrant of right female breast: Secondary | ICD-10-CM | POA: Diagnosis not present

## 2021-10-27 DIAGNOSIS — G629 Polyneuropathy, unspecified: Secondary | ICD-10-CM | POA: Diagnosis not present

## 2021-10-27 LAB — COMPREHENSIVE METABOLIC PANEL
ALT: 13 U/L (ref 0–44)
AST: 19 U/L (ref 15–41)
Albumin: 3.7 g/dL (ref 3.5–5.0)
Alkaline Phosphatase: 77 U/L (ref 38–126)
Anion gap: 7 (ref 5–15)
BUN: 12 mg/dL (ref 8–23)
CO2: 28 mmol/L (ref 22–32)
Calcium: 8.9 mg/dL (ref 8.9–10.3)
Chloride: 102 mmol/L (ref 98–111)
Creatinine, Ser: 0.93 mg/dL (ref 0.44–1.00)
GFR, Estimated: >60 mL/min (ref >60)
Glucose, Bld: 103 mg/dL — ABNORMAL HIGH (ref 70–99)
Potassium: 3.5 mmol/L (ref 3.5–5.1)
Sodium: 137 mmol/L (ref 135–145)
Total Bilirubin: 0.7 mg/dL (ref 0.3–1.2)
Total Protein: 7 g/dL (ref 6.5–8.1)

## 2021-10-27 LAB — CBC WITH DIFFERENTIAL/PLATELET
Abs Immature Granulocytes: 0.01 10*3/uL (ref 0.00–0.07)
Basophils Absolute: 0 10*3/uL (ref 0.0–0.1)
Basophils Relative: 0 %
Eosinophils Absolute: 0.1 10*3/uL (ref 0.0–0.5)
Eosinophils Relative: 1 %
HCT: 39 % (ref 36.0–46.0)
Hemoglobin: 12.8 g/dL (ref 12.0–15.0)
Immature Granulocytes: 0 %
Lymphocytes Relative: 24 %
Lymphs Abs: 1 10*3/uL (ref 0.7–4.0)
MCH: 29.8 pg (ref 26.0–34.0)
MCHC: 32.8 g/dL (ref 30.0–36.0)
MCV: 90.9 fL (ref 80.0–100.0)
Monocytes Absolute: 0.3 10*3/uL (ref 0.1–1.0)
Monocytes Relative: 7 %
Neutro Abs: 2.9 10*3/uL (ref 1.7–7.7)
Neutrophils Relative %: 68 %
Platelets: 169 10*3/uL (ref 150–400)
RBC: 4.29 MIL/uL (ref 3.87–5.11)
RDW: 13.9 % (ref 11.5–15.5)
WBC: 4.4 10*3/uL (ref 4.0–10.5)
nRBC: 0 % (ref 0.0–0.2)

## 2021-10-27 MED ORDER — SODIUM CHLORIDE 0.9 % IV SOLN
Freq: Once | INTRAVENOUS | Status: AC
  Filled 2021-10-27: qty 250

## 2021-10-27 MED ORDER — HEPARIN SOD (PORK) LOCK FLUSH 100 UNIT/ML IV SOLN
500.0000 [IU] | Freq: Once | INTRAVENOUS | Status: AC | PRN
  Administered 2021-10-27: 500 [IU]
  Filled 2021-10-27: qty 5

## 2021-10-27 MED ORDER — HEPARIN SOD (PORK) LOCK FLUSH 100 UNIT/ML IV SOLN
INTRAVENOUS | Status: AC
  Filled 2021-10-27: qty 5

## 2021-10-27 MED ORDER — DIPHENHYDRAMINE HCL 25 MG PO CAPS
50.0000 mg | ORAL_CAPSULE | Freq: Once | ORAL | Status: AC
  Administered 2021-10-27: 50 mg via ORAL
  Filled 2021-10-27: qty 2

## 2021-10-27 MED ORDER — TRASTUZUMAB-ANNS CHEMO 150 MG IV SOLR
6.0000 mg/kg | Freq: Once | INTRAVENOUS | Status: AC
  Administered 2021-10-27: 777 mg via INTRAVENOUS
  Filled 2021-10-27: qty 37

## 2021-10-27 MED ORDER — ACETAMINOPHEN 325 MG PO TABS
650.0000 mg | ORAL_TABLET | Freq: Once | ORAL | Status: AC
  Administered 2021-10-27: 650 mg via ORAL
  Filled 2021-10-27: qty 2

## 2021-10-27 NOTE — Progress Notes (Signed)
Pt here for follow up. Pt repots that she had been having some fluid retention to lasix was increased.  ?

## 2021-10-27 NOTE — Patient Instructions (Signed)
[facility] CANCER CTR AT [place]  Discharge Instructions: ?Thank you for choosing [facility] to provide your oncology and hematology care.  ?If you have a lab appointment with the [place], please go directly to the [place] and check in at the registration area. ? ?Wear comfortable clothing and clothing appropriate for easy access to any Portacath or PICC line.  ? ?We strive to give you quality time with your provider. You may need to reschedule your appointment if you arrive late (15 or more minutes).  Arriving late affects you and other patients whose appointments are after yours.  Also, if you miss three or more appointments without notifying the office, you may be dismissed from the clinic at the provider?s discretion.    ?  ?For prescription refill requests, have your pharmacy contact our office and allow 72 hours for refills to be completed.   ? ?Today you received the following chemotherapy and/or immunotherapy agents Herceptin ?  ?To help prevent nausea and vomiting after your treatment, we encourage you to take your nausea medication as directed. ? ?BELOW ARE SYMPTOMS THAT SHOULD BE REPORTED IMMEDIATELY: ?*FEVER GREATER THAN 100.4 F (38 ?C) OR HIGHER ?*CHILLS OR SWEATING ?*NAUSEA AND VOMITING THAT IS NOT CONTROLLED WITH YOUR NAUSEA MEDICATION ?*UNUSUAL SHORTNESS OF BREATH ?*UNUSUAL BRUISING OR BLEEDING ?*URINARY PROBLEMS (pain or burning when urinating, or frequent urination) ?*BOWEL PROBLEMS (unusual diarrhea, constipation, pain near the anus) ?TENDERNESS IN MOUTH AND THROAT WITH OR WITHOUT PRESENCE OF ULCERS (sore throat, sores in mouth, or a toothache) ?UNUSUAL RASH, SWELLING OR PAIN  ?UNUSUAL VAGINAL DISCHARGE OR ITCHING  ? ?Items with * indicate a potential emergency and should be followed up as soon as possible or go to the Emergency Department if any problems should occur. ? ?Please show the CHEMOTHERAPY ALERT CARD or IMMUNOTHERAPY ALERT CARD at check-in to the  Emergency Department and triage nurse. ? ?Should you have questions after your visit or need to cancel or reschedule your appointment, please contact [facility] CANCER [place] AT [place]  [phone] and follow the prompts.  Office hours are 8:00 a.m. to 4:30 p.m. Monday - Friday. Please note that voicemails left after 4:00 p.m. may not be returned until the following business day.  We are closed weekends and major holidays. You have access to a nurse at all times for urgent questions. Please call the main number to the clinic [phone] and follow the prompts. ? ?For any non-urgent questions, you may also contact your provider using MyChart. We now offer e-Visits for anyone 69 and older to request care online for non-urgent symptoms. For details visit [email or web address]. ?  ?Also download the MyChart app! Go to the app store, search "MyChart", open the app, select [place], and log in with your MyChart username and password. ? ?Due to Covid, a mask is required upon entering the hospital/clinic. If you do not have a mask, one will be given to you upon arrival. For doctor visits, patients may have 1 support person aged 69 or older with them. For treatment visits, patients cannot have anyone with them due to current Covid guidelines and our immunocompromised population.  ?

## 2021-10-27 NOTE — Progress Notes (Signed)
?Hematology/Oncology Progress note ?[phone] [phone] ?  ? ? ? ?Patient Care Team: ?[name], FNP as PCP - General (Family Medicine) ?Minna Merritts, MD as PCP - Cardiology (Cardiology) ?Minna Merritts, MD as Consulting Physician (Cardiology) ?Rico Junker, RN as Oncology Nurse Navigator ?[name], MD as Consulting Physician (Hematology and Oncology) ?Billey Co, MD as Consulting Physician (Urology) ?Noreene Filbert, MD as Consulting Physician (Radiation Oncology) ?Ronny Bacon, MD as Consulting Physician (General Surgery) ? ?REFERRING PROVIDER: ?[name], FNP  ?CHIEF COMPLAINTS/REASON FOR VISIT:  ?Follow-up for stage I HER2 positive breast cancer ? ?HISTORY OF PRESENTING ILLNESS:  ? ?Amber Barnes is a  71 y.o.  female with PMH listed below was seen in consultation at the request of  [name], FNP  for evaluation of weight loss, family history ovarian cancer. ?Patient reports a history of nausea vomiting, early satiety, dysphagia symptoms that started in March.  She has lost significant amount of weight as well. ?Patient weighed 340 pounds in February 2021, 309 pounds in May 2021 and currently she weighs 296 pounds today. ?Patient has tried Zofran and scopolamine patch which did not relieve her symptoms. ?11/05/2019, CT abdomen pelvis showed chronic changes with out acute abnormality to correspond with the patient's symptoms. ?Patient has had right upper quadrant ultrasound done on 11/14/2019, which showed hepatic steatosis with no other acute abnormality. ?Patient was seen by gastroenterology Dr. [name] on 11/18/2019.  Patient was recommended to take PPI daily.  Added Carafate.  H. pylori testing came back positive and the patient was started on a course of tetracycline, metronidazole, Pepto-Bismol for H. pylori treatments.  Patient reports that she has been on the treatment for 7 days now.  Nausea is slightly better but still persistent.   She is following up with gastroenterology next week for discussion of EGD. ?Currently she is not able to tolerate colonoscopy prep due to the nausea.  Her stool was tested positive for occult blood. ?She also reports night sweating which is a chronic symptom for her. ?Patient was last seen on 05/20/2020 for evaluation of weight loss, family history of ovarian cancer patient was referred to establish care with genetic counselor.  Patient eventually declined genetic testing. ? ?12/30/2020, patient had a screening mammogram done which showed possible mass and asymmetry in the right breast. ?01/08/2021, diagnostic mammogram of the right breast showed 2 adjacent irregular spiculated masses.  Larger 1 was 10 mm and a smaller mass measured 5 mm. ?01/15/2021 stereotactic biopsy of the right breast smaller mass showed invasive carcinoma no special type, grade 2, DCIS present, LVI negative. ?Ultrasound guided biopsy of the larger mass showed invasive mammary carcinoma,  ? ?Patient has appointment with surgery Dr. [name] later this week.  She was referred to establish care with oncology for evaluation.  She was accompanied by daughter. ?She has no new complaints. ? ?Family history of breast cancer: Denies ?Family history of other cancers: Patient has a strong family history of leukemia in her mother, colon cancer in her father at age of 75, ovarian cancer in her sister at age of 72, CLL in her brother ? ?02/17/2021, patient is status post lumpectomy and sentinel lymph node biopsy ?Pathology showed invasive mammary carcinoma, 2 separate foci, DCIS, negative sentinel lymph node biopsy 0/1, grade 3, or margins negative for invasive carcinoma.  ER -, PR + [1-10%], HER2 +  pT1b(m) pN0  ? ?# Strong family history of cancer, Discussed again about genetic testing. patient adamantly  declined. ? ?#  Intermittent dizziness which per patient is chronic for her due to the vertigo. ?#03/23/2021 Mediport by vascular surgeon ?#History of  CHF, ?03/08/2021, echocardiogram showed LVEF 60-65%. ?Per patient, she has been seen by Dr. [name] team and was cleared for proceeding with chemotherapy.  ? ?03/24/2021 - 03/31/2021 Taxol 80 mg/m2 [name] ?04/05/2021 - 04/09/2021 patient was admitted due to neutropenic fever with associated SIRS.  Patient was treated with empiric cefepime and vancomycin until date of discharge when [place] recovered to 1.4. ? ?Post hospitalization, patient also had COVID-19 infection so her follow-up appointment was further postponed for another 2 weeks. ? ?05/10/2021 resumed dose reduce Taxol 55m/m2 /[name]?05/25/2021-06/30/2021, cycle 2 dose reduced Taxol 666mm2 /tAmie Critchley ?admitted from 07/12/2021 - 07/17/2021 due to acute pyelonephritis UVJ obstruction and a right hydro ureteralnephrosis.  Patient was treated with IV antibiotics status post emergent bilateral ureteral stent placement.  Discharged home with a course of oral antibiotics. ? ?08/18/2021 - 09/08/2021, status post adjuvant radiation.  Patient had experienced skin toxicities from radiation.  Symptoms are better since the completion of radiation. ? ?10/06/2021, shared decision was made to proceed with maintenance trastuzumab.  Her cardiologist Dr. GoRockey Situave clearance to proceed. ? ?INTERVAL HISTORY ?JaAleksandra Rabens a [age].o. female who has above history reviewed by me today presents for follow up visit for management of right side HER 2 positive breast cancer ? ?Patient is currently on maintenance trastuzumab.  Overall she tolerates very well.  She has noticed increased lower extremity fluid retention.  Lasix has been increased to 40 mg daily which helps the symptoms.  She denies any shortness of breath more than her baseline. ?Review of Systems  ?Constitutional:  Positive for fatigue. Negative for appetite change, chills, fever and unexpected weight change.  ?     Patient sits in the wheelchair  ?HENT:   Negative for hearing loss and voice change.    ?Respiratory:  Negative for chest tightness and cough.   ?Cardiovascular:  Positive for leg swelling. Negative for chest pain.  ?Gastrointestinal:  Negative for abdominal distention, abdominal pain, blood in stool, nausea and vomiting.  ?Endocrine: Negative for hot flashes.  ?Genitourinary:  Negative for difficulty urinating, frequency and hematuria.   ?Musculoskeletal:  Negative for arthralgias.  ?Skin:  Negative for itching and rash.  ?Neurological:  Positive for numbness. Negative for dizziness and extremity weakness.  ?Hematological:  Negative for adenopathy.  ?Psychiatric/Behavioral:  Negative for confusion.   ? ?MEDICAL HISTORY:  ?Past Medical History:  ?Diagnosis Date  ? Achilles tendinitis   ? Acute medial meniscal tear   ? Allergy   ? Anxiety   ? Atrial fibrillation ([place]  ? a. new onset 07/2016; b. CHADS2VASc --> 4 (HTN, female, age, CHF); c. eliquis  ? CHF (congestive heart failure) ([place]  ? Chronic anticoagulation   ? Apixaban  ? Chronic lower back pain   ? Colon polyp   ? Complication of anesthesia   ? Emergence delirium; hypoxia  ? COPD (chronic obstructive pulmonary disease) ([place]  ? Cystic breast   ? Depression   ? Endometriosis   ? Fibrocystic breast disease   ? GERD (gastroesophageal reflux disease)   ? History of stress test   ? a. 07/2016 MV: EF 55-65%. Small, mild defect  in mid anteroseptal, apical septal, and apical locations. No ischemia-->low risk.  ? Hyperlipidemia   ? Hypertension   ? Insomnia   ? Lipoma   ? Malignant neoplasm of upper-outer  quadrant of right female breast ([place])   ? Stage 1A invasive mammary carcinoma (cT1b or T1c N0); ER (-), PR (+), HER2/neu (+)  ? Migraine   ? Mild Mitral regurgitation   ? a. 07/2016 Echo: EF 55-65%, no rwma, mild MR. Mildly dil LA. Nl RV fx.   ? Nephrolithiasis   ? OSA (obstructive sleep apnea)   ? non-compliant with nocturnal PAP therapy  ? Osteoarthritis   ? left knee  ? Osteopenia   ? Peptic ulcer disease   ? a. H. pylori  ? Pneumonia   ? RLS  (restless legs syndrome)   ? Tendonitis   ? Thyroid disease   ? Vitamin D deficiency   ? ? ?SURGICAL HISTORY: ?Past Surgical History:  ?Procedure Laterality Date  ? ABDOMINAL HYSTERECTOMY    ? ovaries left  ? ACHILLES TENDON

## 2021-10-28 ENCOUNTER — Telehealth (INDEPENDENT_AMBULATORY_CARE_PROVIDER_SITE_OTHER): Payer: Medicare Other | Admitting: Internal Medicine

## 2021-10-28 ENCOUNTER — Encounter: Payer: Self-pay | Admitting: Internal Medicine

## 2021-10-28 DIAGNOSIS — L304 Erythema intertrigo: Secondary | ICD-10-CM

## 2021-10-28 MED ORDER — FLUCONAZOLE 150 MG PO TABS: 150.0000 mg | ORAL_TABLET | ORAL | 0 refills | Status: DC

## 2021-10-28 MED ORDER — NYSTATIN 100000 UNIT/GM EX CREA: 1.0000 "application " | TOPICAL_CREAM | Freq: Two times a day (BID) | CUTANEOUS | 5 refills | Status: DC

## 2021-10-28 MED ORDER — HYDROCORTISONE 2.5 % EX OINT: TOPICAL_OINTMENT | Freq: Two times a day (BID) | CUTANEOUS | 2 refills | Status: DC

## 2021-10-28 MED ORDER — NYSTATIN 100000 UNIT/GM EX POWD: 1.0000 "application " | Freq: Three times a day (TID) | CUTANEOUS | 11 refills | Status: DC

## 2021-10-28 NOTE — Progress Notes (Signed)
Virtual Visit via Video Note ? ?I connected with Amber Barnes ? on 10/28/21 at  9:00 AM EDT by a video enabled telemedicine application and verified that I am speaking with the correct person using two identifiers. ? Location patient: [place] ?Location provider:work or home office ?Persons participating in the virtual visit: patient, provider ? ?I discussed the limitations and requested verbal permission for telemedicine visit. The patient expressed understanding and agreed to proceed. ? ? ?HPI: ? ?Acute telemedicine visit for : ?Rash on abdomen had before tried money butt cream w/o relief which is kids diaper rash cream she does have skin folds and had radiation recently rash comes and goes on her abdomen has had nystatin powder in the past  ?Rash burns and open wounds to skin  ? ? ?-Pertinent past medical history: see below ?-Pertinent medication allergies: ?Allergies  ?Allergen Reactions  ? Prednisone Other (See Comments)  ?  Agitation, hyperactivity, polyphagia  ? Amiodarone Nausea And Vomiting  ? Hydrocodone-Acetaminophen Other (See Comments)  ?  Hallucinations and combative  ? Oxycodone Itching  ?  Can take it with benadryl  ? Tapentadol Itching  ? Tramadol Itching  ?  Can take it with benadryl  ? Adhesive [Tape] Itching and Rash  ?  Prefers paper tape  ? Latex Itching and Rash  ?  use paper tap  ? ?-COVID-19 vaccine status:  ?Immunization History  ?Administered Date(s) Administered  ? Fluad Quad(high Dose 65+) 04/08/2019, 03/30/2020  ? Influenza Inj Mdck Quad Pf 04/03/2017  ? Influenza, High Dose Seasonal PF 03/05/2021  ? Influenza,inj,Quad PF,6+ Mos 04/14/2016, 03/30/2018  ? Influenza-Unspecified 03/07/2019  ? PFIZER Comirnaty(Gray Top)Covid-19 Tri-Sucrose Vaccine 01/06/2021  ? PFIZER(Purple Top)SARS-COV-2 Vaccination 08/23/2019, 09/18/2019, 06/02/2020  ? PNEUMOCOCCAL CONJUGATE-20 01/26/2021  ? Pneumococcal Conjugate-13 03/30/2018  ? Pneumococcal Polysaccharide-23 04/18/2019  ? Pneumococcal-Unspecified 03/23/2012  ? Td  11/01/2012  ? Tdap 03/05/2021  ? Zoster Recombinat (Shingrix) 01/06/2021, 03/05/2021  ? Zoster, Live 10/07/2012  ? ? ? ?ROS: See pertinent positives and negatives per HPI. ? ?Past Medical History:  ?Diagnosis Date  ? Achilles tendinitis   ? Acute medial meniscal tear   ? Allergy   ? Anxiety   ? Atrial fibrillation ([place])   ? a. new onset 07/2016; b. CHADS2VASc --> 4 (HTN, female, age, CHF); c. eliquis  ? CHF (congestive heart failure) ([place])   ? Chronic anticoagulation   ? Apixaban  ? Chronic lower back pain   ? Colon polyp   ? Complication of anesthesia   ? Emergence delirium; hypoxia  ? COPD (chronic obstructive pulmonary disease) ([place])   ? Cystic breast   ? Depression   ? Endometriosis   ? Fibrocystic breast disease   ? GERD (gastroesophageal reflux disease)   ? History of stress test   ? a. 07/2016 MV: EF 55-65%. Small, mild defect  in mid anteroseptal, apical septal, and apical locations. No ischemia-->low risk.  ? Hyperlipidemia   ? Hypertension   ? Insomnia   ? Lipoma   ? Malignant neoplasm of upper-outer quadrant of right female breast ([place])   ? Stage 1A invasive mammary carcinoma (cT1b or T1c N0); ER (-), PR (+), HER2/neu (+)  ? Migraine   ? Mild Mitral regurgitation   ? a. 07/2016 Echo: EF 55-65%, no rwma, mild MR. Mildly dil LA. Nl RV fx.   ? Nephrolithiasis   ? OSA (obstructive sleep apnea)   ? non-compliant with nocturnal PAP therapy  ? Osteoarthritis   ? left knee  ? Osteopenia   ?  Peptic ulcer disease   ? a. H. pylori  ? Pneumonia   ? RLS (restless legs syndrome)   ? Tendonitis   ? Thyroid disease   ? Vitamin D deficiency   ? ? ?Past Surgical History:  ?Procedure Laterality Date  ? ABDOMINAL HYSTERECTOMY    ? ovaries left  ? ACHILLES TENDON SURGERY    ? 2012,01/2017  ? BACK SURGERY    ? fusion lumbar  ? BREAST BIOPSY Right 01/15/2021  ? Affirm biopsy-"X" clip-INVASIVE MAMMARY CARCINOMA  ? BREAST BIOPSY Right 01/15/2021  ? u/s bx-"venus" clip INVASIVE MAMMARY CARCINOMA  ? BREAST BIOPSY    ? BREAST  LUMPECTOMY,RADIO FREQ LOCALIZER,AXILLARY SENTINEL LYMPH NODE BIOPSY Right 02/17/2021  ? Procedure: BREAST LUMPECTOMY,RADIO FREQ LOCALIZER,AXILLARY SENTINEL LYMPH NODE BIOPSY;  Surgeon: Ronny Bacon, MD;  Location: [facility];  Service: General;  Laterality: Right;  ? CARDIOVERSION N/A 09/07/2016  ? Procedure: CARDIOVERSION;  Surgeon: Minna Merritts, MD;  Location: [facility];  Service: Cardiovascular;  Laterality: N/A;  ? CARDIOVERSION N/A 10/19/2016  ? Procedure: CARDIOVERSION;  Surgeon: Minna Merritts, MD;  Location: [facility];  Service: Cardiovascular;  Laterality: N/A;  ? CARDIOVERSION N/A 12/14/2016  ? Procedure: CARDIOVERSION;  Surgeon: Minna Merritts, MD;  Location: [facility];  Service: Cardiovascular;  Laterality: N/A;  ? COLONOSCOPY    ? COLONOSCOPY WITH PROPOFOL N/A 05/13/2020  ? Procedure: COLONOSCOPY WITH PROPOFOL;  Surgeon: Toledo, Benay Pike, MD;  Location: [facility];  Service: Gastroenterology;  Laterality: N/A;  ELIQUIS  ? CYSTOSCOPY WITH STENT PLACEMENT Bilateral 07/12/2021  ? Procedure: CYSTOSCOPY WITH STENT PLACEMENT;  Surgeon: Billey Co, MD;  Location: [facility];  Service: Urology;  Laterality: Bilateral;  ? CYSTOSCOPY/URETEROSCOPY/HOLMIUM LASER/STENT PLACEMENT Bilateral 07/30/2021  ? Procedure: CYSTOSCOPY/URETEROSCOPY/HOLMIUM LASER/BILATERAL STENT REMOVAL;  Surgeon: Billey Co, MD;  Location: [facility];  Service: Urology;  Laterality: Bilateral;  ? ESOPHAGOGASTRODUODENOSCOPY    ? ESOPHAGOGASTRODUODENOSCOPY (EGD) WITH PROPOFOL N/A 05/13/2020  ? Procedure: ESOPHAGOGASTRODUODENOSCOPY (EGD) WITH PROPOFOL;  Surgeon: Toledo, Benay Pike, MD;  Location: [facility];  Service: Gastroenterology;  Laterality: N/A;  ? FOOT SURGERY Left   ? tendon  ? JOINT REPLACEMENT Right   ? hip  ? PORTA CATH INSERTION N/A 03/23/2021  ? Procedure: PORTA CATH INSERTION;  Surgeon: Katha Cabal, MD;  Location: [facility];  Service: Cardiovascular;  Laterality: N/A;  ? PORTACATH PLACEMENT N/A  03/23/2021  ? Procedure: ATTEMPTED INSERTION PORT-A-CATH;  Surgeon: Ronny Bacon, MD;  Location: [facility];  Service: General;  Laterality: N/A;  ? THYROIDECTOMY    ? thyroid lobectomy secondary to a benign nodule  ? TOTAL HIP ARTHROPLASTY Right 08/21/2018  ? Procedure: TOTAL HIP ARTHROPLASTY ANTERIOR APPROACH-RIGHT CELL SAVER REQUESTED;  Surgeon: [name], MD;  Location: [facility];  Service: Orthopedics;  Laterality: Right;  ? ? ? ?Current Outpatient Medications:  ?  acetaminophen (TYLENOL) 500 MG tablet, Take 500-1,000 mg by mouth every 6 (six) hours as needed for mild pain or moderate pain., Disp: , Rfl:  ?  ALPRAZolam (XANAX) 0.25 MG tablet, TAKE 1 TABLET BY MOUTH DAILY AS NEEDED FOR ANXIETY OR SLEEP., Disp: 30 tablet, Rfl: 2 ?  apixaban (ELIQUIS) 5 MG TABS tablet, Take 5 mg by mouth 2 (two) times daily., Disp: , Rfl:  ?  azelastine (ASTELIN) 0.1 % nasal spray, Place 1-2 sprays into both nostrils daily as needed for rhinitis., Disp: , Rfl:  ?  bisoprolol (ZEBETA) 5 MG tablet, Take 5 mg by mouth in the morning and at  bedtime., Disp: , Rfl:  ?  busPIRone (BUSPAR) 5 MG tablet, TAKE 1 TABLET BY MOUTH THREE TIMES A DAY AS NEEDED, Disp: 60 tablet, Rfl: 1 ?  Cholecalciferol (VITAMIN D3) 5000 units CAPS, Take 5,000 Units by mouth daily., Disp: , Rfl:  ?  clotrimazole (LOTRIMIN) 1 % cream, Apply 1 application topically 2 (two) times daily., Disp: 30 g, Rfl: 0 ?  diphenhydrAMINE (BENADRYL) 25 mg capsule, Take 1 capsule (25 mg total) by mouth every 8 (eight) hours as needed for itching or allergies., Disp: 30 capsule, Rfl: 0 ?  DULoxetine (CYMBALTA) 30 MG capsule, TAKE 2 CAPSULES BY MOUTH EVERY DAY FOR TOTAL DOSE OF 60MG, Disp: 180 capsule, Rfl: 2 ?  esomeprazole (NEXIUM) 20 MG capsule, Take 20 mg by mouth daily as needed (GERD symptoms)., Disp: , Rfl:  ?  fluconazole (DIFLUCAN) 150 MG tablet, Take 1 tablet (150 mg total) by mouth once a week. X 2-4 weeks, Disp: 4 tablet, Rfl: 0 ?  furosemide (LASIX) 20 MG tablet,  TAKE 1 TABLET BY MOUTH TWICE A DAY, Disp: 180 tablet, Rfl: 0 ?  hydrocortisone 2.5 % ointment, Apply topically 2 (two) times daily. prn, Disp: 60 g, Rfl: 2 ?  lidocaine-prilocaine (EMLA) cream, Apply to affecte

## 2021-11-07 DIAGNOSIS — M1712 Unilateral primary osteoarthritis, left knee: Secondary | ICD-10-CM | POA: Diagnosis not present

## 2021-11-07 DIAGNOSIS — G629 Polyneuropathy, unspecified: Secondary | ICD-10-CM | POA: Diagnosis not present

## 2021-11-07 DIAGNOSIS — J449 Chronic obstructive pulmonary disease, unspecified: Secondary | ICD-10-CM | POA: Diagnosis not present

## 2021-11-07 DIAGNOSIS — I509 Heart failure, unspecified: Secondary | ICD-10-CM | POA: Diagnosis not present

## 2021-11-07 DIAGNOSIS — C50911 Malignant neoplasm of unspecified site of right female breast: Secondary | ICD-10-CM | POA: Diagnosis not present

## 2021-11-07 NOTE — Progress Notes (Signed)
?  ?  ? ? ? ?Date:  11/08/2021  ? ?ID:  [name], DOB 1951-07-09, MRN [id number] ? ?Patient Location:  ?[address] ?[place] [address]  ? ?Provider location:   ?[place], [employer] office ? ?PCP:  [name], FNP  ?Cardiologist:  [facility] ? ?Chief Complaint  ?Patient presents with  ? 3 month follow up   ?  "Doing well." Cardiac clearance for left knee surgery. Medications reviewed by the patient verbally.   ? ? ?History of Present Illness:   ? ?Amber Barnes is a 71 y.o. female past medical history of ?Paroxysmal atrial fibrillation, cardioversion 2018 ?Normal sinus rhythm did not hold on flecainide, changed to amiodarone ? cardioversion, normal sinus rhythm ?obesity,  ?arthritis,  ?hyperlipidemia,  ?hypertension,  ?OSA , not on CPAP ?Migraines, anxiety ?Stress test January 2018 showing no ischemia, Ejection fraction 55-65% ?CHA2DS2-VASc of at least 3 (female, age, HTN). ?who presents for follow-up of her hyperlipidemia, hypertension, persistent atrial fibrillation ? ?Last seen by video visit 07/2021 ?Undergoing treatment for breast cancer ?currently on maintenance trastuzumab ?repeat echocardiogram every 3 months while on treatments per  oncology ? ?In follow-up today reports that she feels well ?Reports that she would like to have knee replacement surgery ?Scheduled to see orthopedics this month ? ?No recent episodes of infection, feels her weight is stable ?Energy is good, no leg edema no PND orthopnea ?Takes Lasix daily, occasionally with second dose if she has ankle swelling, takes with potassium ? ?Denies any tachypalpitations concerning for atrial fibrillation ?Reports that she stopped diltiazem, this was contributing to leg swelling ?Heart rate blood pressure stable off the diltiazem ?Off amiodarone.  Does not remember where this was held ? ?EKG personally reviewed by myself on todays visit ?Sinus bradycardia rate 55 bpm no significant ST-T wave  changes ? ?Other past medical history reviewed ? ?In hospital early January 2023 ?6 days of IV antibiotic for your kidney infection ?Acute pyelonephritis and ureterovesical junction (UVJ) obstruction and right hydroureteronephrosis ?--Dr. Diamantina Providence of urology did urgent Bilateral ureteral stent placement.  ?bisoprolol and Cardizem are held due to your low heart rate.   ?Eliquis held due to significant blood in the urine. ?Was in atrial fib on arrival ?D/c on 07/17/2021 ? ?NSR 07/29/2021 when echo performed ?Normal ejection fraction greater than 60% ? ?Hx of breast cancer HER2 positive in July 2022, underwent lumpectomy, lymph node biopsy, recent start of chemotherapy with weekly Taxol plus  trastuzumab .  ?  ?Chemotherapy early September 2022 ?She presented to the emergency room September 26, anorexia, malaise, shortness of breath, atrial fibrillation ?Heart rate in the emergency room 137 bpm ?Started on broad-spectrum antibiotics for possible sepsis, neutropenic fevers ?Started on diltiazem infusion for rate control, has been continued on her Eliquis ?Heart rate running low 100 bpm, asymptomatic ?  ?cardioversion procedure 09/07/2016 ? ?Past Medical History:  ?Diagnosis Date  ? Achilles tendinitis   ? Acute medial meniscal tear   ? Allergy   ? Anxiety   ? Atrial fibrillation ([place])   ? a. new onset 07/2016; b. CHADS2VASc --> 4 (HTN, female, age, CHF); c. eliquis  ? CHF (congestive heart failure) ([place])   ? Chronic anticoagulation   ? Apixaban  ? Chronic lower back pain   ? Colon polyp   ? Complication of anesthesia   ? Emergence delirium; hypoxia  ? COPD (chronic obstructive pulmonary disease) ([place])   ? Cystic breast   ? Depression   ?  Endometriosis   ? Fibrocystic breast disease   ? GERD (gastroesophageal reflux disease)   ? History of stress test   ? a. 07/2016 MV: EF 55-65%. Small, mild defect  in mid anteroseptal, apical septal, and apical locations. No ischemia-->low risk.  ? Hyperlipidemia   ? Hypertension   ?  Insomnia   ? Lipoma   ? Malignant neoplasm of upper-outer quadrant of right female breast ([place])   ? Stage 1A invasive mammary carcinoma (cT1b or T1c N0); ER (-), PR (+), HER2/neu (+)  ? Migraine   ? Mild Mitral regurgitation   ? a. 07/2016 Echo: EF 55-65%, no rwma, mild MR. Mildly dil LA. Nl RV fx.   ? Nephrolithiasis   ? OSA (obstructive sleep apnea)   ? non-compliant with nocturnal PAP therapy  ? Osteoarthritis   ? left knee  ? Osteopenia   ? Peptic ulcer disease   ? a. H. pylori  ? Pneumonia   ? RLS (restless legs syndrome)   ? Tendonitis   ? Thyroid disease   ? Vitamin D deficiency   ? ?Past Surgical History:  ?Procedure Laterality Date  ? ABDOMINAL HYSTERECTOMY    ? ovaries left  ? ACHILLES TENDON SURGERY    ? 2012,01/2017  ? BACK SURGERY    ? fusion lumbar  ? BREAST BIOPSY Right 01/15/2021  ? Affirm biopsy-"X" clip-INVASIVE MAMMARY CARCINOMA  ? BREAST BIOPSY Right 01/15/2021  ? u/s bx-"venus" clip INVASIVE MAMMARY CARCINOMA  ? BREAST BIOPSY    ? BREAST LUMPECTOMY,RADIO FREQ LOCALIZER,AXILLARY SENTINEL LYMPH NODE BIOPSY Right 02/17/2021  ? Procedure: BREAST LUMPECTOMY,RADIO FREQ LOCALIZER,AXILLARY SENTINEL LYMPH NODE BIOPSY;  Surgeon: Ronny Bacon, MD;  Location: [facility];  Service: General;  Laterality: Right;  ? CARDIOVERSION N/A 09/07/2016  ? Procedure: CARDIOVERSION;  Surgeon: Minna Merritts, MD;  Location: [facility];  Service: Cardiovascular;  Laterality: N/A;  ? CARDIOVERSION N/A 10/19/2016  ? Procedure: CARDIOVERSION;  Surgeon: Minna Merritts, MD;  Location: [facility];  Service: Cardiovascular;  Laterality: N/A;  ? CARDIOVERSION N/A 12/14/2016  ? Procedure: CARDIOVERSION;  Surgeon: Minna Merritts, MD;  Location: [facility];  Service: Cardiovascular;  Laterality: N/A;  ? COLONOSCOPY    ? COLONOSCOPY WITH PROPOFOL N/A 05/13/2020  ? Procedure: COLONOSCOPY WITH PROPOFOL;  Surgeon: Toledo, Benay Pike, MD;  Location: [facility];  Service: Gastroenterology;  Laterality: N/A;  ELIQUIS  ? CYSTOSCOPY WITH  STENT PLACEMENT Bilateral 07/12/2021  ? Procedure: CYSTOSCOPY WITH STENT PLACEMENT;  Surgeon: Billey Co, MD;  Location: [facility];  Service: Urology;  Laterality: Bilateral;  ? CYSTOSCOPY/URETEROSCOPY/HOLMIUM LASER/STENT PLACEMENT Bilateral 07/30/2021  ? Procedure: CYSTOSCOPY/URETEROSCOPY/HOLMIUM LASER/BILATERAL STENT REMOVAL;  Surgeon: Billey Co, MD;  Location: [facility];  Service: Urology;  Laterality: Bilateral;  ? ESOPHAGOGASTRODUODENOSCOPY    ? ESOPHAGOGASTRODUODENOSCOPY (EGD) WITH PROPOFOL N/A 05/13/2020  ? Procedure: ESOPHAGOGASTRODUODENOSCOPY (EGD) WITH PROPOFOL;  Surgeon: Toledo, Benay Pike, MD;  Location: [facility];  Service: Gastroenterology;  Laterality: N/A;  ? FOOT SURGERY Left   ? tendon  ? JOINT REPLACEMENT Right   ? hip  ? PORTA CATH INSERTION N/A 03/23/2021  ? Procedure: PORTA CATH INSERTION;  Surgeon: Katha Cabal, MD;  Location: [facility];  Service: Cardiovascular;  Laterality: N/A;  ? PORTACATH PLACEMENT N/A 03/23/2021  ? Procedure: ATTEMPTED INSERTION PORT-A-CATH;  Surgeon: Ronny Bacon, MD;  Location: [facility];  Service: General;  Laterality: N/A;  ? THYROIDECTOMY    ? thyroid lobectomy secondary to a benign nodule  ? TOTAL HIP ARTHROPLASTY  Right 08/21/2018  ? Procedure: TOTAL HIP ARTHROPLASTY ANTERIOR APPROACH-RIGHT CELL SAVER REQUESTED;  Surgeon: [name], MD;  Location: [facility];  Service: Orthopedics;  Laterality: Right;  ?  ? ? ?Allergies:   Prednisone, Amiodarone, Hydrocodone-acetaminophen, Oxycodone, Tapentadol, Tramadol, Adhesive [tape], and Latex  ? ?Social History  ? ?Tobacco Use  ? Smoking status: Former  ?  Packs/day: 0.25  ?  Years: 10.00  ?  Pack years: 2.50  ?  Types: Cigarettes  ?  Quit date: 2005  ?  Years since quitting: 18.3  ? Smokeless tobacco: Never  ? Tobacco comments:  ?  smoked for 10 years, 1 pack every 2-3 days  ?Vaping Use  ? Vaping Use: Never used  ?Substance Use Topics  ? Alcohol use: Not Currently  ?  Alcohol/week: 1.0  standard drink  ?  Types: 1 Glasses of wine per week  ?  Comment: rarely  ? Drug use: No  ?  ? ?Current Outpatient Medications on File Prior to Visit  ?Medication Sig Dispense Refill  ? acetaminophen (TYLENOL) 500 MG

## 2021-11-08 ENCOUNTER — Ambulatory Visit (INDEPENDENT_AMBULATORY_CARE_PROVIDER_SITE_OTHER): Payer: Medicare Other | Admitting: Cardiovascular Disease

## 2021-11-08 ENCOUNTER — Encounter: Payer: Self-pay | Admitting: Cardiovascular Disease

## 2021-11-08 VITALS — BP 118/80 | HR 55 | Ht 65.0 in | Wt 262.2 lb

## 2021-11-08 DIAGNOSIS — N189 Chronic kidney disease, unspecified: Secondary | ICD-10-CM | POA: Diagnosis not present

## 2021-11-08 DIAGNOSIS — I10 Essential (primary) hypertension: Secondary | ICD-10-CM | POA: Diagnosis not present

## 2021-11-08 DIAGNOSIS — E785 Hyperlipidemia, unspecified: Secondary | ICD-10-CM

## 2021-11-08 DIAGNOSIS — I4891 Unspecified atrial fibrillation: Secondary | ICD-10-CM | POA: Diagnosis not present

## 2021-11-08 DIAGNOSIS — I5032 Chronic diastolic (congestive) heart failure: Secondary | ICD-10-CM

## 2021-11-08 DIAGNOSIS — C50919 Malignant neoplasm of unspecified site of unspecified female breast: Secondary | ICD-10-CM

## 2021-11-08 DIAGNOSIS — Z09 Encounter for follow-up examination after completed treatment for conditions other than malignant neoplasm: Secondary | ICD-10-CM

## 2021-11-08 NOTE — Patient Instructions (Addendum)
Medication Instructions:  ?No changes ? ?If you need a refill on your cardiac medications before your next appointment, please call your pharmacy.  ? ?Lab work: ?No new labs needed ? ?Testing/Procedures: ? ?Your physician has requested that you have an echocardiogram. Echocardiography is a painless test that uses sound waves to create images of your heart. It provides your doctor with information about the size and shape of your heart and how well your heart?s chambers and valves are working. This procedure takes approximately one hour. There are no restrictions for this procedure.  ? ?Follow-Up: ?At [facility], you and your health needs are our priority.  As part of our continuing mission to provide you with exceptional heart care, we have created designated Provider Care Teams.  These Care Teams include your primary Cardiologist (physician) and Advanced Practice Providers (APPs -  Physician Assistants and Nurse Practitioners) who all work together to provide you with the care you need, when you need it. ? ?You will need a follow up appointment in 6 months ? ?Providers on your designated Care Team:   ?Murray Hodgkins, NP ?Christell Faith, PA-C ?Cadence Kathlen Mody, PA-C ? ?COVID-19 Vaccine Information can be found at: [email or web address].uk For questions related to vaccine distribution or appointments, please email [email or web address] or call [phone].  ? ?

## 2021-11-14 ENCOUNTER — Other Ambulatory Visit: Payer: Self-pay | Admitting: Family

## 2021-11-14 ENCOUNTER — Other Ambulatory Visit: Payer: Self-pay | Admitting: Cardiovascular Disease

## 2021-11-16 DIAGNOSIS — M1712 Unilateral primary osteoarthritis, left knee: Secondary | ICD-10-CM | POA: Diagnosis not present

## 2021-11-16 DIAGNOSIS — G8929 Other chronic pain: Secondary | ICD-10-CM | POA: Insufficient documentation

## 2021-11-17 ENCOUNTER — Inpatient Hospital Stay: Payer: Medicare Other | Admitting: Oncology

## 2021-11-17 ENCOUNTER — Inpatient Hospital Stay: Payer: Medicare Other | Attending: Oncology

## 2021-11-17 ENCOUNTER — Inpatient Hospital Stay: Payer: Medicare Other

## 2021-11-17 ENCOUNTER — Encounter: Payer: Self-pay | Admitting: Oncology

## 2021-11-17 VITALS — BP 150/65 | HR 60 | Resp 16

## 2021-11-17 VITALS — BP 177/98 | HR 58 | Temp 96.2°F | Wt 259.0 lb

## 2021-11-17 DIAGNOSIS — G629 Polyneuropathy, unspecified: Secondary | ICD-10-CM | POA: Insufficient documentation

## 2021-11-17 DIAGNOSIS — Z806 Family history of leukemia: Secondary | ICD-10-CM | POA: Diagnosis not present

## 2021-11-17 DIAGNOSIS — E559 Vitamin D deficiency, unspecified: Secondary | ICD-10-CM | POA: Diagnosis not present

## 2021-11-17 DIAGNOSIS — J449 Chronic obstructive pulmonary disease, unspecified: Secondary | ICD-10-CM | POA: Diagnosis not present

## 2021-11-17 DIAGNOSIS — M5412 Radiculopathy, cervical region: Secondary | ICD-10-CM | POA: Diagnosis not present

## 2021-11-17 DIAGNOSIS — Z86010 Personal history of colonic polyps: Secondary | ICD-10-CM | POA: Diagnosis not present

## 2021-11-17 DIAGNOSIS — E785 Hyperlipidemia, unspecified: Secondary | ICD-10-CM | POA: Insufficient documentation

## 2021-11-17 DIAGNOSIS — K760 Fatty (change of) liver, not elsewhere classified: Secondary | ICD-10-CM | POA: Insufficient documentation

## 2021-11-17 DIAGNOSIS — C50919 Malignant neoplasm of unspecified site of unspecified female breast: Secondary | ICD-10-CM | POA: Diagnosis not present

## 2021-11-17 DIAGNOSIS — Z87891 Personal history of nicotine dependence: Secondary | ICD-10-CM | POA: Diagnosis not present

## 2021-11-17 DIAGNOSIS — Z5112 Encounter for antineoplastic immunotherapy: Secondary | ICD-10-CM | POA: Diagnosis not present

## 2021-11-17 DIAGNOSIS — Z5111 Encounter for antineoplastic chemotherapy: Secondary | ICD-10-CM | POA: Diagnosis not present

## 2021-11-17 DIAGNOSIS — I509 Heart failure, unspecified: Secondary | ICD-10-CM | POA: Insufficient documentation

## 2021-11-17 DIAGNOSIS — C50411 Malignant neoplasm of upper-outer quadrant of right female breast: Secondary | ICD-10-CM | POA: Insufficient documentation

## 2021-11-17 DIAGNOSIS — Z7901 Long term (current) use of anticoagulants: Secondary | ICD-10-CM | POA: Insufficient documentation

## 2021-11-17 DIAGNOSIS — I10 Essential (primary) hypertension: Secondary | ICD-10-CM | POA: Diagnosis not present

## 2021-11-17 DIAGNOSIS — I4891 Unspecified atrial fibrillation: Secondary | ICD-10-CM | POA: Diagnosis not present

## 2021-11-17 DIAGNOSIS — Z803 Family history of malignant neoplasm of breast: Secondary | ICD-10-CM | POA: Insufficient documentation

## 2021-11-17 DIAGNOSIS — R42 Dizziness and giddiness: Secondary | ICD-10-CM | POA: Diagnosis not present

## 2021-11-17 DIAGNOSIS — K219 Gastro-esophageal reflux disease without esophagitis: Secondary | ICD-10-CM | POA: Insufficient documentation

## 2021-11-17 DIAGNOSIS — R200 Anesthesia of skin: Secondary | ICD-10-CM | POA: Insufficient documentation

## 2021-11-17 DIAGNOSIS — R202 Paresthesia of skin: Secondary | ICD-10-CM | POA: Diagnosis not present

## 2021-11-17 DIAGNOSIS — Z171 Estrogen receptor negative status [ER-]: Secondary | ICD-10-CM | POA: Diagnosis not present

## 2021-11-17 LAB — CBC WITH DIFFERENTIAL/PLATELET
Abs Immature Granulocytes: 0.01 10*3/uL (ref 0.00–0.07)
Basophils Absolute: 0 10*3/uL (ref 0.0–0.1)
Basophils Relative: 1 %
Eosinophils Absolute: 0 10*3/uL (ref 0.0–0.5)
Eosinophils Relative: 1 %
HCT: 37.6 % (ref 36.0–46.0)
Hemoglobin: 12.5 g/dL (ref 12.0–15.0)
Immature Granulocytes: 0 %
Lymphocytes Relative: 26 %
Lymphs Abs: 0.9 10*3/uL (ref 0.7–4.0)
MCH: 30.3 pg (ref 26.0–34.0)
MCHC: 33.2 g/dL (ref 30.0–36.0)
MCV: 91.3 fL (ref 80.0–100.0)
Monocytes Absolute: 0.3 10*3/uL (ref 0.1–1.0)
Monocytes Relative: 8 %
Neutro Abs: 2.3 10*3/uL (ref 1.7–7.7)
Neutrophils Relative %: 64 %
Platelets: 151 10*3/uL (ref 150–400)
RBC: 4.12 MIL/uL (ref 3.87–5.11)
RDW: 14.1 % (ref 11.5–15.5)
WBC: 3.6 10*3/uL — ABNORMAL LOW (ref 4.0–10.5)
nRBC: 0 % (ref 0.0–0.2)

## 2021-11-17 LAB — COMPREHENSIVE METABOLIC PANEL
ALT: 14 U/L (ref 0–44)
AST: 18 U/L (ref 15–41)
Albumin: 3.7 g/dL (ref 3.5–5.0)
Alkaline Phosphatase: 74 U/L (ref 38–126)
Anion gap: 8 (ref 5–15)
BUN: 19 mg/dL (ref 8–23)
CO2: 25 mmol/L (ref 22–32)
Calcium: 8.4 mg/dL — ABNORMAL LOW (ref 8.9–10.3)
Chloride: 102 mmol/L (ref 98–111)
Creatinine, Ser: 0.85 mg/dL (ref 0.44–1.00)
GFR, Estimated: >60 mL/min (ref >60)
Glucose, Bld: 95 mg/dL (ref 70–99)
Potassium: 3.6 mmol/L (ref 3.5–5.1)
Sodium: 135 mmol/L (ref 135–145)
Total Bilirubin: 0.7 mg/dL (ref 0.3–1.2)
Total Protein: 6.9 g/dL (ref 6.5–8.1)

## 2021-11-17 MED ORDER — DIPHENHYDRAMINE HCL 25 MG PO CAPS
50.0000 mg | ORAL_CAPSULE | Freq: Once | ORAL | Status: AC
  Administered 2021-11-17: 50 mg via ORAL
  Filled 2021-11-17: qty 2

## 2021-11-17 MED ORDER — TRASTUZUMAB-ANNS CHEMO 150 MG IV SOLR
6.0000 mg/kg | Freq: Once | INTRAVENOUS | Status: AC
  Administered 2021-11-17: 777 mg via INTRAVENOUS
  Filled 2021-11-17: qty 37

## 2021-11-17 MED ORDER — HEPARIN SOD (PORK) LOCK FLUSH 100 UNIT/ML IV SOLN
500.0000 [IU] | Freq: Once | INTRAVENOUS | Status: AC | PRN
  Administered 2021-11-17: 500 [IU]
  Filled 2021-11-17: qty 5

## 2021-11-17 MED ORDER — ACETAMINOPHEN 325 MG PO TABS
650.0000 mg | ORAL_TABLET | Freq: Once | ORAL | Status: AC
  Administered 2021-11-17: 650 mg via ORAL
  Filled 2021-11-17: qty 2

## 2021-11-17 MED ORDER — SODIUM CHLORIDE 0.9 % IV SOLN
Freq: Once | INTRAVENOUS | Status: AC
  Filled 2021-11-17: qty 250

## 2021-11-17 NOTE — Patient Instructions (Signed)
[facility]  Discharge Instructions: ?Thank you for choosing [facility] to provide your oncology and hematology care.  ?If you have a lab appointment with the [facility], please go directly to the [facility] and check in at the registration area. ? ?Wear comfortable clothing and clothing appropriate for easy access to any Portacath or PICC line.  ? ?We strive to give you quality time with your provider. You may need to reschedule your appointment if you arrive late (15 or more minutes).  Arriving late affects you and other patients whose appointments are after yours.  Also, if you miss three or more appointments without notifying the office, you may be dismissed from the clinic at the provider?s discretion.    ?  ?For prescription refill requests, have your pharmacy contact our office and allow 72 hours for refills to be completed.   ? ?Today you received the following chemotherapy and/or immunotherapy agents [place]    ?  ?To help prevent nausea and vomiting after your treatment, we encourage you to take your nausea medication as directed. ? ?BELOW ARE SYMPTOMS THAT SHOULD BE REPORTED IMMEDIATELY: ?*FEVER GREATER THAN 100.4 F (38 ?C) OR HIGHER ?*CHILLS OR SWEATING ?*NAUSEA AND VOMITING THAT IS NOT CONTROLLED WITH YOUR NAUSEA MEDICATION ?*UNUSUAL SHORTNESS OF BREATH ?*UNUSUAL BRUISING OR BLEEDING ?*URINARY PROBLEMS (pain or burning when urinating, or frequent urination) ?*BOWEL PROBLEMS (unusual diarrhea, constipation, pain near the anus) ?TENDERNESS IN MOUTH AND THROAT WITH OR WITHOUT PRESENCE OF ULCERS (sore throat, sores in mouth, or a toothache) ?UNUSUAL RASH, SWELLING OR PAIN  ?UNUSUAL VAGINAL DISCHARGE OR ITCHING  ? ?Items with * indicate a potential emergency and should be followed up as soon as possible or go to the Emergency Department if any problems should occur. ? ?Please show the CHEMOTHERAPY ALERT CARD or IMMUNOTHERAPY ALERT CARD at check-in to  the Emergency Department and triage nurse. ? ?Should you have questions after your visit or need to cancel or reschedule your appointment, please contact [facility] [place] [facility] [place]  [phone] and follow the prompts.  Office hours are 8:00 a.m. to 4:30 p.m. Monday - Friday. Please note that voicemails left after 4:00 p.m. may not be returned until the following business day.  We are closed weekends and major holidays. You have access to a nurse at all times for urgent questions. Please call the main number to the clinic [phone] and follow the prompts. ? ?For any non-urgent questions, you may also contact your provider using MyChart. We now offer e-Visits for anyone 14 and older to request care online for non-urgent symptoms. For details visit [email or web address]. ?  ?Also download the MyChart app! Go to the app store, search "MyChart", open the app, select [facility], and log in with your MyChart username and password. ? ?Due to Covid, a mask is required upon entering the hospital/clinic. If you do not have a mask, one will be given to you upon arrival. For doctor visits, patients may have 1 support person aged 40 or older with them. For treatment visits, patients cannot have anyone with them due to current Covid guidelines and our immunocompromised population.  ?

## 2021-11-17 NOTE — Progress Notes (Signed)
?Hematology/Oncology Progress note ?[phone] [phone] ?  ? ? ? ?Patient Care Team: ?Amber Barnes as PCP - General (Family Medicine) ?Amber Merritts, MD as PCP - Cardiology (Cardiology) ?Amber Merritts, MD as Consulting Physician (Cardiology) ?Amber Junker, RN as Oncology Nurse Navigator ?Amber Server, MD as Consulting Physician (Hematology and Oncology) ?Amber Co, MD as Consulting Physician (Urology) ?Amber Filbert, MD as Consulting Physician (Radiation Oncology) ?Amber Bacon, MD as Consulting Physician (General Surgery) ? ?REFERRING PROVIDER: ?Amber Barnes  ?CHIEF COMPLAINTS/REASON FOR VISIT:  ?Follow-up for stage I HER2 positive breast cancer ? ?HISTORY OF PRESENTING ILLNESS:  ? ?Amber Barnes is a  71 y.o.  female with PMH listed below was seen in consultation at the request of  Amber Barnes  for evaluation of weight loss, family history ovarian cancer. ?Patient reports a history of nausea vomiting, early satiety, dysphagia symptoms that started in March.  She has lost significant amount of weight as well. ?Patient weighed 340 pounds in February 2021, 309 pounds in May 2021 and currently she weighs 296 pounds today. ?Patient has tried Zofran and scopolamine patch which did not relieve her symptoms. ?11/05/2019, CT abdomen pelvis showed chronic changes with out acute abnormality to correspond with the patient's symptoms. ?Patient has had right upper quadrant ultrasound done on 11/14/2019, which showed hepatic steatosis with no other acute abnormality. ?Patient was seen by gastroenterology Amber Barnes on 11/18/2019.  Patient was recommended to take PPI daily.  Added Carafate.  H. pylori testing came back positive and the patient was started on a course of tetracycline, metronidazole, Pepto-Bismol for H. pylori treatments.  Patient reports that she has been on the treatment for 7 days now.  Nausea is slightly better but still persistent.   She is following up with gastroenterology next week for discussion of EGD. ?Currently she is not able to tolerate colonoscopy prep due to the nausea.  Her stool was tested positive for occult blood. ?She also reports night sweating which is a chronic symptom for her. ?Patient was last seen on 05/20/2020 for evaluation of weight loss, family history of ovarian cancer patient was referred to establish care with genetic counselor.  Patient eventually declined genetic testing. ? ?12/30/2020, patient had a screening mammogram done which showed possible mass and asymmetry in the right breast. ?01/08/2021, diagnostic mammogram of the right breast showed 2 adjacent irregular spiculated masses.  Larger 1 was 10 mm and a smaller mass measured 5 mm. ?01/15/2021 stereotactic biopsy of the right breast smaller mass showed invasive carcinoma no special type, grade 2, DCIS present, LVI negative. ?Ultrasound guided biopsy of the larger mass showed invasive mammary carcinoma,  ? ?Patient has appointment with surgery Amber Barnes later this week.  She was referred to establish care with oncology for evaluation.  She was accompanied by daughter. ?She has no new complaints. ? ?Family history of breast cancer: Denies ?Family history of other cancers: Patient has a strong family history of leukemia in her mother, colon cancer in her father at age of 76, ovarian cancer in her sister at age of 63, CLL in her brother ? ?02/17/2021, patient is status post lumpectomy and sentinel lymph node biopsy ?Pathology showed invasive mammary carcinoma, 2 separate foci, DCIS, negative sentinel lymph node biopsy 0/1, grade 3, or margins negative for invasive carcinoma.  ER -, PR + [1-10%], HER2 +  pT1b(m) pN0  ? ?# Strong family history of cancer, Discussed again about genetic testing. patient adamantly  declined. ? ?#  Intermittent dizziness which per patient is chronic for her due to the vertigo. ?#03/23/2021 Mediport by vascular surgeon ?#History of  CHF, ?03/08/2021, echocardiogram showed LVEF 60-65%. ?Per patient, she has been seen by Dr. [name] team and was cleared for proceeding with chemotherapy.  ? ?03/24/2021 - 03/31/2021 Taxol 80 mg/m2 Amber Barnes ?04/05/2021 - 04/09/2021 patient was admitted due to neutropenic fever with associated SIRS.  Patient was treated with empiric cefepime and vancomycin until date of discharge when Amber Barnes recovered to 1.4. ? ?Post hospitalization, patient also had COVID-19 infection so her follow-up appointment was further postponed for another 2 weeks. ? ?05/10/2021 resumed dose reduce Taxol 64m/m2 /Amber Barnes?05/25/2021-06/30/2021, cycle 2 dose reduced Taxol 654mm2 /tAmie Barnes ?admitted from 07/12/2021 - 07/17/2021 due to acute pyelonephritis UVJ obstruction and a right hydro ureteralnephrosis.  Patient was treated with IV antibiotics status post emergent bilateral ureteral stent placement.  Discharged home with a course of oral antibiotics. ? ?08/18/2021 - 09/08/2021, status post adjuvant radiation.  Patient had experienced skin toxicities from radiation.  Symptoms are better since the completion of radiation. ? ?10/06/2021, shared decision was made to proceed with maintenance trastuzumab.  Her cardiologist Dr. GoRockey Situave clearance to proceed. ? ?INTERVAL HISTORY ?JaAleeza Bellvilles a [age].o. female who has above history reviewed by me today presents for follow up visit for management of right side HER 2 positive breast cancer ? ?Patient is currently on maintenance trastuzumab.  ?Overall she tolerates well. ?Patient has pain with osteoarthritis.  Orthopedic surgeon recommends total knee replacement.  She was recommended to lose additional 20 pounds prior to being cleared for surgery. ?Patient denies any chest pain, shortness of breath, diarrhea. ? ?Review of Systems  ?Constitutional:  Positive for fatigue. Negative for appetite change, chills, fever and unexpected weight change.  ?     Patient sits in the wheelchair  ?HENT:    Negative for hearing loss and voice change.   ?Respiratory:  Negative for chest tightness and cough.   ?Cardiovascular:  Positive for leg swelling. Negative for chest pain.  ?Gastrointestinal:  Negative for abdominal distention, abdominal pain, blood in stool, nausea and vomiting.  ?Endocrine: Negative for hot flashes.  ?Genitourinary:  Negative for difficulty urinating, frequency and hematuria.   ?Musculoskeletal:  Negative for arthralgias.  ?Skin:  Negative for itching and rash.  ?Neurological:  Positive for numbness. Negative for dizziness and extremity weakness.  ?Hematological:  Negative for adenopathy.  ?Psychiatric/Behavioral:  Negative for confusion.   ? ?MEDICAL HISTORY:  ?Past Medical History:  ?Diagnosis Date  ? Achilles tendinitis   ? Acute medial meniscal tear   ? Allergy   ? Anxiety   ? Atrial fibrillation ([facility]  ? a. new onset 07/2016; b. CHADS2VASc --> 4 (HTN, female, age, CHF); c. eliquis  ? CHF (congestive heart failure) ([place]  ? Chronic anticoagulation   ? Apixaban  ? Chronic lower back pain   ? Colon polyp   ? Complication of anesthesia   ? Emergence delirium; hypoxia  ? COPD (chronic obstructive pulmonary disease) ([place]  ? Cystic breast   ? Depression   ? Endometriosis   ? Fibrocystic breast disease   ? GERD (gastroesophageal reflux disease)   ? History of stress test   ? a. 07/2016 MV: EF 55-65%. Small, mild defect  in mid anteroseptal, apical septal, and apical locations. No ischemia-->low risk.  ? Hyperlipidemia   ? Hypertension   ? Insomnia   ? Lipoma   ? Malignant  neoplasm of upper-outer quadrant of right female breast ([place])   ? Stage 1A invasive mammary carcinoma (cT1b or T1c N0); ER (-), PR (+), HER2/neu (+)  ? Migraine   ? Mild Mitral regurgitation   ? a. 07/2016 Echo: EF 55-65%, no rwma, mild MR. Mildly dil LA. Nl RV fx.   ? Nephrolithiasis   ? OSA (obstructive sleep apnea)   ? non-compliant with nocturnal PAP therapy  ? Osteoarthritis   ? left knee  ? Osteopenia   ? Peptic ulcer  disease   ? a. H. pylori  ? Pneumonia   ? RLS (restless legs syndrome)   ? Tendonitis   ? Thyroid disease   ? Vitamin D deficiency   ? ? ?SURGICAL HISTORY: ?Past Surgical History:  ?Procedure Laterality Date  ? ABDOM

## 2021-11-26 ENCOUNTER — Ambulatory Visit (INDEPENDENT_AMBULATORY_CARE_PROVIDER_SITE_OTHER): Payer: Medicare Other

## 2021-11-26 DIAGNOSIS — C50411 Malignant neoplasm of upper-outer quadrant of right female breast: Secondary | ICD-10-CM | POA: Diagnosis not present

## 2021-11-26 DIAGNOSIS — Z0189 Encounter for other specified special examinations: Secondary | ICD-10-CM | POA: Diagnosis not present

## 2021-11-26 DIAGNOSIS — Z09 Encounter for follow-up examination after completed treatment for conditions other than malignant neoplasm: Secondary | ICD-10-CM | POA: Diagnosis not present

## 2021-11-26 LAB — ECHOCARDIOGRAM COMPLETE
AR max vel: 2.34 cm2
AV Area VTI: 2.65 cm2
AV Area mean vel: 2.01 cm2
AV Mean grad: 4 mmHg
AV Peak grad: 8 mmHg
Ao pk vel: 1.41 m/s
Area-P 1/2: 3 cm2
Calc EF: 54.5 %
S' Lateral: 3.7 cm
Single Plane A2C EF: 50.8 %
Single Plane A4C EF: 55.8 %

## 2021-11-30 ENCOUNTER — Ambulatory Visit: Payer: Medicare Other

## 2021-12-01 ENCOUNTER — Encounter: Payer: Self-pay | Admitting: Oncology

## 2021-12-01 ENCOUNTER — Ambulatory Visit: Payer: Medicare Other

## 2021-12-07 DIAGNOSIS — J449 Chronic obstructive pulmonary disease, unspecified: Secondary | ICD-10-CM | POA: Diagnosis not present

## 2021-12-07 DIAGNOSIS — M1712 Unilateral primary osteoarthritis, left knee: Secondary | ICD-10-CM | POA: Diagnosis not present

## 2021-12-07 DIAGNOSIS — C50911 Malignant neoplasm of unspecified site of right female breast: Secondary | ICD-10-CM | POA: Diagnosis not present

## 2021-12-07 DIAGNOSIS — I509 Heart failure, unspecified: Secondary | ICD-10-CM | POA: Diagnosis not present

## 2021-12-07 DIAGNOSIS — G629 Polyneuropathy, unspecified: Secondary | ICD-10-CM | POA: Diagnosis not present

## 2021-12-08 ENCOUNTER — Inpatient Hospital Stay: Payer: Medicare Other

## 2021-12-08 ENCOUNTER — Other Ambulatory Visit: Payer: Self-pay | Admitting: Family

## 2021-12-08 ENCOUNTER — Inpatient Hospital Stay (HOSPITAL_BASED_OUTPATIENT_CLINIC_OR_DEPARTMENT_OTHER): Payer: Medicare Other | Admitting: Oncology

## 2021-12-08 ENCOUNTER — Encounter: Payer: Self-pay | Admitting: Oncology

## 2021-12-08 VITALS — BP 152/78 | HR 59 | Temp 96.7°F | Ht 65.0 in | Wt 262.0 lb

## 2021-12-08 DIAGNOSIS — Z86010 Personal history of colonic polyps: Secondary | ICD-10-CM | POA: Diagnosis not present

## 2021-12-08 DIAGNOSIS — C50411 Malignant neoplasm of upper-outer quadrant of right female breast: Secondary | ICD-10-CM | POA: Diagnosis not present

## 2021-12-08 DIAGNOSIS — E785 Hyperlipidemia, unspecified: Secondary | ICD-10-CM | POA: Diagnosis not present

## 2021-12-08 DIAGNOSIS — Z171 Estrogen receptor negative status [ER-]: Secondary | ICD-10-CM | POA: Diagnosis not present

## 2021-12-08 DIAGNOSIS — Z803 Family history of malignant neoplasm of breast: Secondary | ICD-10-CM | POA: Diagnosis not present

## 2021-12-08 DIAGNOSIS — Z7901 Long term (current) use of anticoagulants: Secondary | ICD-10-CM | POA: Diagnosis not present

## 2021-12-08 DIAGNOSIS — E559 Vitamin D deficiency, unspecified: Secondary | ICD-10-CM | POA: Diagnosis not present

## 2021-12-08 DIAGNOSIS — R42 Dizziness and giddiness: Secondary | ICD-10-CM | POA: Diagnosis not present

## 2021-12-08 DIAGNOSIS — K219 Gastro-esophageal reflux disease without esophagitis: Secondary | ICD-10-CM | POA: Diagnosis not present

## 2021-12-08 DIAGNOSIS — I509 Heart failure, unspecified: Secondary | ICD-10-CM | POA: Diagnosis not present

## 2021-12-08 DIAGNOSIS — R202 Paresthesia of skin: Secondary | ICD-10-CM | POA: Diagnosis not present

## 2021-12-08 DIAGNOSIS — J449 Chronic obstructive pulmonary disease, unspecified: Secondary | ICD-10-CM | POA: Diagnosis not present

## 2021-12-08 DIAGNOSIS — F419 Anxiety disorder, unspecified: Secondary | ICD-10-CM

## 2021-12-08 DIAGNOSIS — K760 Fatty (change of) liver, not elsewhere classified: Secondary | ICD-10-CM | POA: Diagnosis not present

## 2021-12-08 DIAGNOSIS — I10 Essential (primary) hypertension: Secondary | ICD-10-CM | POA: Diagnosis not present

## 2021-12-08 DIAGNOSIS — G629 Polyneuropathy, unspecified: Secondary | ICD-10-CM | POA: Diagnosis not present

## 2021-12-08 DIAGNOSIS — Z5112 Encounter for antineoplastic immunotherapy: Secondary | ICD-10-CM | POA: Diagnosis not present

## 2021-12-08 DIAGNOSIS — R200 Anesthesia of skin: Secondary | ICD-10-CM | POA: Diagnosis not present

## 2021-12-08 DIAGNOSIS — I4891 Unspecified atrial fibrillation: Secondary | ICD-10-CM | POA: Diagnosis not present

## 2021-12-08 DIAGNOSIS — C50919 Malignant neoplasm of unspecified site of unspecified female breast: Secondary | ICD-10-CM | POA: Diagnosis not present

## 2021-12-08 DIAGNOSIS — Z806 Family history of leukemia: Secondary | ICD-10-CM | POA: Diagnosis not present

## 2021-12-08 DIAGNOSIS — M5412 Radiculopathy, cervical region: Secondary | ICD-10-CM | POA: Diagnosis not present

## 2021-12-08 DIAGNOSIS — M7989 Other specified soft tissue disorders: Secondary | ICD-10-CM

## 2021-12-08 DIAGNOSIS — Z87891 Personal history of nicotine dependence: Secondary | ICD-10-CM | POA: Diagnosis not present

## 2021-12-08 LAB — COMPREHENSIVE METABOLIC PANEL
ALT: 15 U/L (ref 0–44)
AST: 21 U/L (ref 15–41)
Albumin: 3.6 g/dL (ref 3.5–5.0)
Alkaline Phosphatase: 71 U/L (ref 38–126)
Anion gap: 8 (ref 5–15)
BUN: 15 mg/dL (ref 8–23)
CO2: 29 mmol/L (ref 22–32)
Calcium: 8.7 mg/dL — ABNORMAL LOW (ref 8.9–10.3)
Chloride: 104 mmol/L (ref 98–111)
Creatinine, Ser: 0.79 mg/dL (ref 0.44–1.00)
GFR, Estimated: >60 mL/min (ref >60)
Glucose, Bld: 124 mg/dL — ABNORMAL HIGH (ref 70–99)
Potassium: 3.7 mmol/L (ref 3.5–5.1)
Sodium: 141 mmol/L (ref 135–145)
Total Bilirubin: 0.6 mg/dL (ref 0.3–1.2)
Total Protein: 6.7 g/dL (ref 6.5–8.1)

## 2021-12-08 LAB — CBC WITH DIFFERENTIAL/PLATELET
Abs Immature Granulocytes: 0.01 10*3/uL (ref 0.00–0.07)
Basophils Absolute: 0 10*3/uL (ref 0.0–0.1)
Basophils Relative: 0 %
Eosinophils Absolute: 0.1 10*3/uL (ref 0.0–0.5)
Eosinophils Relative: 1 %
HCT: 36.7 % (ref 36.0–46.0)
Hemoglobin: 12.2 g/dL (ref 12.0–15.0)
Immature Granulocytes: 0 %
Lymphocytes Relative: 34 %
Lymphs Abs: 1.6 10*3/uL (ref 0.7–4.0)
MCH: 30.7 pg (ref 26.0–34.0)
MCHC: 33.2 g/dL (ref 30.0–36.0)
MCV: 92.4 fL (ref 80.0–100.0)
Monocytes Absolute: 0.3 10*3/uL (ref 0.1–1.0)
Monocytes Relative: 6 %
Neutro Abs: 2.8 10*3/uL (ref 1.7–7.7)
Neutrophils Relative %: 59 %
Platelets: 173 10*3/uL (ref 150–400)
RBC: 3.97 MIL/uL (ref 3.87–5.11)
RDW: 14 % (ref 11.5–15.5)
WBC: 4.7 10*3/uL (ref 4.0–10.5)
nRBC: 0 % (ref 0.0–0.2)

## 2021-12-08 MED ORDER — TRASTUZUMAB-ANNS CHEMO 150 MG IV SOLR
6.0000 mg/kg | Freq: Once | INTRAVENOUS | Status: AC
  Administered 2021-12-08: 777 mg via INTRAVENOUS
  Filled 2021-12-08: qty 37

## 2021-12-08 MED ORDER — GABAPENTIN 100 MG PO CAPS: 100.0000 mg | ORAL_CAPSULE | Freq: Three times a day (TID) | ORAL | 0 refills | Status: DC

## 2021-12-08 MED ORDER — SODIUM CHLORIDE 0.9% FLUSH
10.0000 mL | Freq: Once | INTRAVENOUS | Status: AC
  Administered 2021-12-08: 10 mL via INTRAVENOUS
  Filled 2021-12-08: qty 10

## 2021-12-08 MED ORDER — ACETAMINOPHEN 325 MG PO TABS
650.0000 mg | ORAL_TABLET | Freq: Once | ORAL | Status: AC
  Administered 2021-12-08: 650 mg via ORAL
  Filled 2021-12-08: qty 2

## 2021-12-08 MED ORDER — SODIUM CHLORIDE 0.9 % IV SOLN
Freq: Once | INTRAVENOUS | Status: AC
  Filled 2021-12-08: qty 250

## 2021-12-08 MED ORDER — HEPARIN SOD (PORK) LOCK FLUSH 100 UNIT/ML IV SOLN
500.0000 [IU] | Freq: Once | INTRAVENOUS | Status: AC | PRN
  Filled 2021-12-08: qty 5

## 2021-12-08 MED ORDER — DIPHENHYDRAMINE HCL 25 MG PO CAPS
50.0000 mg | ORAL_CAPSULE | Freq: Once | ORAL | Status: AC
  Administered 2021-12-08: 50 mg via ORAL
  Filled 2021-12-08: qty 2

## 2021-12-08 MED ORDER — HEPARIN SOD (PORK) LOCK FLUSH 100 UNIT/ML IV SOLN
INTRAVENOUS | Status: AC
  Administered 2021-12-08: 500 [IU]
  Filled 2021-12-08: qty 5

## 2021-12-08 NOTE — Patient Instructions (Addendum)
[facility] AT [place]  Discharge Instructions: Thank you for choosing [facility] to provide your oncology and hematology care.  If you have a lab appointment with the [facility], please go directly to the [facility] and check in at the registration area.  Wear comfortable clothing and clothing appropriate for easy access to any Portacath or PICC line.   We strive to give you quality time with your provider. You may need to reschedule your appointment if you arrive late (15 or more minutes).  Arriving late affects you and other patients whose appointments are after yours.  Also, if you miss three or more appointments without notifying the office, you may be dismissed from the clinic at the provider's discretion.      For prescription refill requests, have your pharmacy contact our office and allow 72 hours for refills to be completed.    Today you received the following chemotherapy and/or immunotherapy agents Trastuzumab       To help prevent nausea and vomiting after your treatment, we encourage you to take your nausea medication as directed.  BELOW ARE SYMPTOMS THAT SHOULD BE REPORTED IMMEDIATELY: *FEVER GREATER THAN 100.4 F (38 C) OR HIGHER *CHILLS OR SWEATING *NAUSEA AND VOMITING THAT IS NOT CONTROLLED WITH YOUR NAUSEA MEDICATION *UNUSUAL SHORTNESS OF BREATH *UNUSUAL BRUISING OR BLEEDING *URINARY PROBLEMS (pain or burning when urinating, or frequent urination) *BOWEL PROBLEMS (unusual diarrhea, constipation, pain near the anus) TENDERNESS IN MOUTH AND THROAT WITH OR WITHOUT PRESENCE OF ULCERS (sore throat, sores in mouth, or a toothache) UNUSUAL RASH, SWELLING OR PAIN  UNUSUAL VAGINAL DISCHARGE OR ITCHING   Items with * indicate a potential emergency and should be followed up as soon as possible or go to the Emergency Department if any problems should occur.  Please show the CHEMOTHERAPY ALERT CARD or IMMUNOTHERAPY ALERT CARD at check-in  to the Emergency Department and triage nurse.  Should you have questions after your visit or need to cancel or reschedule your appointment, please contact [facility] [place] AT [place]  [phone] and follow the prompts.  Office hours are 8:00 a.m. to 4:30 p.m. Monday - Friday. Please note that voicemails left after 4:00 p.m. may not be returned until the following business day.  We are closed weekends and major holidays. You have access to a nurse at all times for urgent questions. Please call the main number to the clinic [phone] and follow the prompts.  For any non-urgent questions, you may also contact your provider using MyChart. We now offer e-Visits for anyone 59 and older to request care online for non-urgent symptoms. For details visit [email or web address].   Also download the MyChart app! Go to the app store, search "MyChart", open the app, select [place], and log in with your MyChart username and password.  Due to Covid, a mask is required upon entering the hospital/clinic. If you do not have a mask, one will be given to you upon arrival. For doctor visits, patients may have 1 support person aged 66 or older with them. For treatment visits, patients cannot have anyone with them due to current Covid guidelines and our immunocompromised population.

## 2021-12-08 NOTE — Progress Notes (Signed)
Hematology/Oncology Progress note [phone] [phone]      Patient Care Team: [name], FNP as PCP - General (Family Medicine) [name] [name], MD as PCP - Cardiology (Cardiology) Minna Merritts, MD as Consulting Physician (Cardiology) Rico Junker, RN as Oncology Nurse Navigator [name], MD as Consulting Physician (Hematology and Oncology) Billey Co, MD as Consulting Physician (Urology) Noreene Filbert, MD as Consulting Physician (Radiation Oncology) Ronny Bacon, MD as Consulting Physician (General Surgery)  REFERRING PROVIDER: [name], FNP  CHIEF COMPLAINTS/REASON FOR VISIT:  Follow-up for stage I HER2 positive breast cancer  HISTORY OF PRESENTING ILLNESS:   Amber Barnes is a  71 y.o.  female with PMH listed below was seen in consultation at the request of  [name], FNP  for evaluation of weight loss, family history ovarian cancer. Patient reports a history of nausea vomiting, early satiety, dysphagia symptoms that started in March.  She has lost significant amount of weight as well. Patient weighed 340 pounds in February 2021, 309 pounds in May 2021 and currently she weighs 296 pounds today. Patient has tried Zofran and scopolamine patch which did not relieve her symptoms. 11/05/2019, CT abdomen pelvis showed chronic changes with out acute abnormality to correspond with the patient's symptoms. Patient has had right upper quadrant ultrasound done on 11/14/2019, which showed hepatic steatosis with no other acute abnormality. Patient was seen by gastroenterology Dr. [name] on 11/18/2019.  Patient was recommended to take PPI daily.  Added Carafate.  H. pylori testing came back positive and the patient was started on a course of tetracycline, metronidazole, Pepto-Bismol for H. pylori treatments.  Patient reports that she has been on the treatment for 7 days now.  Nausea is slightly better but still persistent.   She is following up with gastroenterology next week for discussion of EGD. Currently she is not able to tolerate colonoscopy prep due to the nausea.  Her stool was tested positive for occult blood. She also reports night sweating which is a chronic symptom for her. Patient was last seen on 05/20/2020 for evaluation of weight loss, family history of ovarian cancer patient was referred to establish care with genetic counselor.  Patient eventually declined genetic testing.  12/30/2020, patient had a screening mammogram done which showed possible mass and asymmetry in the right breast. 01/08/2021, diagnostic mammogram of the right breast showed 2 adjacent irregular spiculated masses.  Larger 1 was 10 mm and a smaller mass measured 5 mm. 01/15/2021 stereotactic biopsy of the right breast smaller mass showed invasive carcinoma no special type, grade 2, DCIS present, LVI negative. Ultrasound guided biopsy of the larger mass showed invasive mammary carcinoma,   Patient has appointment with surgery Dr. [name] later this week.  She was referred to establish care with oncology for evaluation.  She was accompanied by daughter. She has no new complaints.  Family history of breast cancer: Denies Family history of other cancers: Patient has a strong family history of leukemia in her mother, colon cancer in her father at age of 30, ovarian cancer in her sister at age of 8, CLL in her brother  02/17/2021, patient is status post lumpectomy and sentinel lymph node biopsy Pathology showed invasive mammary carcinoma, 2 separate foci, DCIS, negative sentinel lymph node biopsy 0/1, grade 3, or margins negative for invasive carcinoma.  ER -, PR + [1-10%], HER2 +  pT1b(m) pN0   # Strong family history of cancer, Discussed again about genetic testing. patient adamantly  declined.  #  Intermittent dizziness which per patient is chronic for her due to the vertigo. #03/23/2021 Mediport by vascular surgeon #History of  CHF, 03/08/2021, echocardiogram showed LVEF 60-65%. Per patient, she has been seen by Dr. [name] team and was cleared for proceeding with chemotherapy.   03/24/2021 - 03/31/2021 Taxol 80 mg/m2 [name] 04/05/2021 - 04/09/2021 patient was admitted due to neutropenic fever with associated SIRS.  Patient was treated with empiric cefepime and vancomycin until date of discharge when [place] recovered to 1.4.  Post hospitalization, patient also had COVID-19 infection so her follow-up appointment was further postponed for another 2 weeks.  05/10/2021 resumed dose reduce Taxol $RemoveBefo'65mg'kqYbNZtIEBp$ /m2 [name] 05/25/2021-06/30/2021, cycle 2 dose reduced Taxol $RemoveBefor'65mg'XVOWUpoNYmru$ /m2 /trastuzumab  admitted from 07/12/2021 - 07/17/2021 due to acute pyelonephritis UVJ obstruction and a right hydro ureteralnephrosis.  Patient was treated with IV antibiotics status post emergent bilateral ureteral stent placement.  Discharged home with a course of oral antibiotics.  08/18/2021 - 09/08/2021, status post adjuvant radiation.  Patient had experienced skin toxicities from radiation.  Symptoms are better since the completion of radiation.  10/06/2021, shared decision was made to proceed with maintenance trastuzumab.  Her cardiologist Dr. [name] gave clearance to proceed.  INTERVAL HISTORY Amber Barnes is a 71 y.o. female who has above history reviewed by me today presents for follow up visit for management of right side HER 2 positive breast cancer  Patient is currently on maintenance trastuzumab.  Patient reports feeling well.  No new complaints today. 519, 23, echocardiogram showed LVEF 55 to 18%, grade 1 diastolic dysfunction. Worsening of numbness/tingling   Review of Systems  Constitutional:  Positive for fatigue. Negative for appetite change, chills, fever and unexpected weight change.       Patient sits in the wheelchair  HENT:   Negative for hearing loss and voice change.   Respiratory:  Negative for chest tightness and cough.    Cardiovascular:  Positive for leg swelling. Negative for chest pain.  Gastrointestinal:  Negative for abdominal distention, abdominal pain, blood in stool, nausea and vomiting.  Endocrine: Negative for hot flashes.  Genitourinary:  Negative for difficulty urinating, frequency and hematuria.   Musculoskeletal:  Negative for arthralgias.  Skin:  Negative for itching and rash.  Neurological:  Positive for numbness. Negative for dizziness and extremity weakness.  Hematological:  Negative for adenopathy.  Psychiatric/Behavioral:  Negative for confusion.    MEDICAL HISTORY:  Past Medical History:  Diagnosis Date   Achilles tendinitis    Acute medial meniscal tear    Allergy    Anxiety    Atrial fibrillation ([place])    a. new onset 07/2016; b. CHADS2VASc --> 4 (HTN, female, age, CHF); c. eliquis   CHF (congestive heart failure) (HCC)    Chronic anticoagulation    Apixaban   Chronic lower back pain    Colon polyp    Complication of anesthesia    Emergence delirium; hypoxia   COPD (chronic obstructive pulmonary disease) ([place])    Cystic breast    Depression    Endometriosis    Fibrocystic breast disease    GERD (gastroesophageal reflux disease)    History of stress test    a. 07/2016 MV: EF 55-65%. Small, mild defect  in mid anteroseptal, apical septal, and apical locations. No ischemia-->low risk.   Hyperlipidemia    Hypertension    Insomnia    Lipoma    Malignant neoplasm of upper-outer quadrant of right female breast ([place])    Stage 1A invasive  mammary carcinoma (cT1b or T1c N0); ER (-), PR (+), HER2/neu (+)   Migraine    Mild Mitral regurgitation    a. 07/2016 Echo: EF 55-65%, no rwma, mild MR. Mildly dil LA. Nl RV fx.    Nephrolithiasis    OSA (obstructive sleep apnea)    non-compliant with nocturnal PAP therapy   Osteoarthritis    left knee   Osteopenia    Peptic ulcer disease    a. H. pylori   Pneumonia    RLS (restless legs syndrome)    Tendonitis    Thyroid disease     Vitamin D deficiency     SURGICAL HISTORY: Past Surgical History:  Procedure Laterality Date   ABDOMINAL HYSTERECTOMY     ovaries left   ACHILLES TENDON SURGERY     2012,01/2017   BACK SURGERY     fusion lumbar   BREAST BIOPSY Right 01/15/2021   Affirm biopsy-"X" clip-INVASIVE MAMMARY CARCINOMA   BREAST BIOPSY Right 01/15/2021   u/s bx-"venus" clip INVASIVE MAMMARY CARCINOMA   BREAST BIOPSY     BREAST LUMPECTOMY,RADIO FREQ LOCALIZER,AXILLARY SENTINEL LYMPH NODE BIOPSY Right 02/17/2021   Procedure: BREAST LUMPECTOMY,RADIO FREQ LOCALIZER,AXILLARY SENTINEL LYMPH NODE BIOPSY;  Surgeon: Ronny Bacon, MD;  Location: [facility];  Service: General;  Laterality: Right;   CARDIOVERSION N/A 09/07/2016   Procedure: CARDIOVERSION;  Surgeon: Minna Merritts, MD;  Location: [facility];  Service: Cardiovascular;  Laterality: N/A;   CARDIOVERSION N/A 10/19/2016   Procedure: CARDIOVERSION;  Surgeon: Minna Merritts, MD;  Location: [facility];  Service: Cardiovascular;  Laterality: N/A;   CARDIOVERSION N/A 12/14/2016   Procedure: CARDIOVERSION;  Surgeon: Minna Merritts, MD;  Location: [facility];  Service: Cardiovascular;  Laterality: N/A;   COLONOSCOPY     COLONOSCOPY WITH PROPOFOL N/A 05/13/2020   Procedure: COLONOSCOPY WITH PROPOFOL;  Surgeon: Toledo, Benay Pike, MD;  Location: [facility];  Service: Gastroenterology;  Laterality: N/A;  ELIQUIS   CYSTOSCOPY WITH STENT PLACEMENT Bilateral 07/12/2021   Procedure: CYSTOSCOPY WITH STENT PLACEMENT;  Surgeon: Billey Co, MD;  Location: [facility];  Service: Urology;  Laterality: Bilateral;   CYSTOSCOPY/URETEROSCOPY/HOLMIUM LASER/STENT PLACEMENT Bilateral 07/30/2021   Procedure: CYSTOSCOPY/URETEROSCOPY/HOLMIUM LASER/BILATERAL STENT REMOVAL;  Surgeon: Billey Co, MD;  Location: [facility];  Service: Urology;  Laterality: Bilateral;   ESOPHAGOGASTRODUODENOSCOPY     ESOPHAGOGASTRODUODENOSCOPY (EGD) WITH PROPOFOL N/A 05/13/2020   Procedure:  ESOPHAGOGASTRODUODENOSCOPY (EGD) WITH PROPOFOL;  Surgeon: Toledo, Benay Pike, MD;  Location: [facility];  Service: Gastroenterology;  Laterality: N/A;   FOOT SURGERY Left    tendon   JOINT REPLACEMENT Right    hip   PORTA CATH INSERTION N/A 03/23/2021   Procedure: PORTA CATH INSERTION;  Surgeon: Katha Cabal, MD;  Location: [facility];  Service: Cardiovascular;  Laterality: N/A;   PORTACATH PLACEMENT N/A 03/23/2021   Procedure: ATTEMPTED INSERTION PORT-A-CATH;  Surgeon: Ronny Bacon, MD;  Location: [facility];  Service: General;  Laterality: N/A;   THYROIDECTOMY     thyroid lobectomy secondary to a benign nodule   TOTAL HIP ARTHROPLASTY Right 08/21/2018   Procedure: TOTAL HIP ARTHROPLASTY ANTERIOR APPROACH-RIGHT CELL SAVER REQUESTED;  Surgeon: [name], MD;  Location: [facility];  Service: Orthopedics;  Laterality: Right;    SOCIAL HISTORY: Social History   Socioeconomic History   Marital status: Married    Spouse name: jeffrey   Number of children: 1   Years of education: Not on file   Highest education level: High school graduate  Occupational History  Comment: disabled  Tobacco Use   Smoking status: Former    Packs/day: 0.25    Years: 10.00    Pack years: 2.50    Types: Cigarettes    Quit date: 2005    Years since quitting: 18.4   Smokeless tobacco: Never   Tobacco comments:    smoked for 10 years, 1 pack every 2-3 days  Vaping Use   Vaping Use: Never used  Substance and Sexual Activity   Alcohol use: Not Currently    Alcohol/week: 1.0 standard drink    Types: 1 Glasses of wine per week    Comment: rarely   Drug use: No   Sexual activity: Not Currently  Other Topics Concern   Not on file  Social History Narrative   Moved to [place] from [place], originally from [place].    1 cup of caffeine daily   Right handed   Lives with husband, step-daughter and child       Social Determinants of Health   Financial Resource  Strain: Not on file  Food Insecurity: Not on file  Transportation Needs: Not on file  Physical Activity: Not on file  Stress: Not on file  Social Connections: Not on file  Intimate Partner Violence: Not on file    FAMILY HISTORY: Family History  Problem Relation Age of Onset   Cancer Sister 30       ovarian   Depression Sister    Cancer Brother        CLL   Cancer Father 43       colon   Heart disease Father    Heart attack Father 36       died from MI   Hyperlipidemia Father    Hypertension Father    Cancer Mother        leukemia   Breast cancer Paternal Aunt        45's   Migraines Neg Hx     ALLERGIES:  is allergic to prednisone, amiodarone, hydrocodone-acetaminophen, oxycodone, tapentadol, tramadol, adhesive [tape], and latex.  MEDICATIONS:  Current Outpatient Medications  Medication Sig Dispense Refill   acetaminophen (TYLENOL) 500 MG tablet Take 500-1,000 mg by mouth every 6 (six) hours as needed for mild pain or moderate pain.     ALPRAZolam (XANAX) 0.25 MG tablet TAKE 1 TABLET BY MOUTH DAILY AS NEEDED FOR ANXIETY OR SLEEP. 30 tablet 2   apixaban (ELIQUIS) 5 MG TABS tablet Take 5 mg by mouth 2 (two) times daily.     azelastine (ASTELIN) 0.1 % nasal spray Place 1-2 sprays into both nostrils daily as needed for rhinitis.     bisoprolol (ZEBETA) 5 MG tablet Take 5 mg by mouth in the morning and at bedtime.     busPIRone (BUSPAR) 5 MG tablet TAKE 1 TABLET BY MOUTH THREE TIMES A DAY AS NEEDED 60 tablet 1   Cholecalciferol (VITAMIN D3) 5000 units CAPS Take 5,000 Units by mouth daily.     clotrimazole (LOTRIMIN) 1 % cream Apply 1 application topically 2 (two) times daily. 30 g 0   diphenhydrAMINE (BENADRYL) 25 mg capsule Take 1 capsule (25 mg total) by mouth every 8 (eight) hours as needed for itching or allergies. 30 capsule 0   DULoxetine (CYMBALTA) 30 MG capsule TAKE 2 CAPSULES BY MOUTH EVERY DAY FOR TOTAL DOSE OF $Remov'60MG'qQFWOc$  180 capsule 2   esomeprazole (NEXIUM) 20 MG  capsule Take 20 mg by mouth daily as needed (GERD symptoms).  fluconazole (DIFLUCAN) 150 MG tablet Take 1 tablet (150 mg total) by mouth once a week. X 2-4 weeks 4 tablet 0   furosemide (LASIX) 20 MG tablet TAKE 1 TABLET BY MOUTH TWICE A DAY 180 tablet 0   hydrocortisone 2.5 % ointment Apply topically 2 (two) times daily. prn 60 g 2   KLOR-CON M20 20 MEQ tablet TAKE 1 TABLET BY MOUTH EVERY DAY 90 tablet 1   lidocaine-prilocaine (EMLA) cream Apply to affected area once 30 g 3   loperamide (IMODIUM) 2 MG capsule Take 1 capsule (2 mg total) by mouth See admin instructions. Initial: 4 mg, followed by 2 mg after each loose stool; maximum: 16 mg/day 60 capsule 1   Multiple Vitamin (MULTIVITAMIN) tablet Take 1 tablet by mouth daily.     nystatin (MYCOSTATIN/NYSTOP) powder Apply 1 application. topically 3 (three) times daily. 60 g 11   nystatin cream (MYCOSTATIN) Apply 1 application. topically 2 (two) times daily. Prn abdomen 60 g 5   ondansetron (ZOFRAN) 8 MG tablet Take 1 tablet (8 mg total) by mouth every 8 (eight) hours as needed for nausea or vomiting. 60 tablet 0   prochlorperazine (COMPAZINE) 10 MG tablet Take 1 tablet (10 mg total) by mouth every 6 (six) hours as needed (Nausea or vomiting). 30 tablet 1   promethazine (PHENERGAN) 25 MG tablet Take 1 tablet (25 mg total) by mouth every 6 (six) hours as needed for nausea or vomiting. 60 tablet 0   ranolazine (RANEXA) 500 MG 12 hr tablet TAKE 1 TABLET BY MOUTH TWICE A DAY 180 tablet 1   rizatriptan (MAXALT) 10 MG tablet TAKE 1 TABLET (10 MG TOTAL) BY MOUTH AS NEEDED FOR MIGRAINE. MAY REPEAT IN 2 HOURS IF NEEDED 10 tablet 0   rOPINIRole (REQUIP) 2 MG tablet Take 1 tablet (2 mg total) by mouth at bedtime. 90 tablet 1   rosuvastatin (CRESTOR) 20 MG tablet TAKE 1 TABLET BY MOUTH EVERY DAY 90 tablet 0   silver sulfADIAZINE (SILVADENE) 1 % cream Apply 1 application. topically 2 (two) times daily. 50 g 3   No current facility-administered medications for  this visit.   Facility-Administered Medications Ordered in Other Visits  Medication Dose Route Frequency Provider Last Rate Last Admin   sodium chloride flush (NS) 0.9 % injection 10 mL  10 mL Intravenous Once [name], MD         PHYSICAL EXAMINATION: ECOG PERFORMANCE STATUS: 2 - Symptomatic, <50% confined to bed Vitals:   12/08/21 1304  BP: (!) 152/78  Pulse: (!) 59  Temp: (!) 96.7 F (35.9 C)    Filed Weights   12/08/21 1304  Weight: 262 lb (118.8 kg)     Physical Exam Constitutional:      General: She is not in acute distress.    Appearance: She is obese.  HENT:     Head: Normocephalic and atraumatic.  Eyes:     General: No scleral icterus. Cardiovascular:     Rate and Rhythm: Normal rate and regular rhythm.     Heart sounds: Normal heart sounds.  Pulmonary:     Effort: Pulmonary effort is normal. No respiratory distress.     Breath sounds: No wheezing.  Abdominal:     General: Bowel sounds are normal. There is no distension.     Palpations: Abdomen is soft.  Musculoskeletal:        General: No deformity. Normal range of motion.     Cervical back: Normal range of motion and neck  supple.     Comments: 1+ edema bilaterally in lower extremity  Skin:    General: Skin is warm and dry.     Findings: No erythema or rash.  Neurological:     Mental Status: She is alert and oriented to person, place, and time. Mental status is at baseline.     Cranial Nerves: No cranial nerve deficit.     Coordination: Coordination normal.  Psychiatric:        Mood and Affect: Mood normal.    . LABORATORY DATA:  I have reviewed the data as listed Lab Results  Component Value Date   WBC 3.6 (L) 11/17/2021   HGB 12.5 11/17/2021   HCT 37.6 11/17/2021   MCV 91.3 11/17/2021   PLT 151 11/17/2021   Recent Labs    07/02/21 1256 07/03/21 0711 10/06/21 0834 10/27/21 0855 11/17/21 0851  NA 137   < > 136 137 135  K 3.7   < > 3.8 3.5 3.6  CL 108   < > 104 102 102  CO2 24   <  > $'26 28 25  't$ GLUCOSE 104*   < > 102* 103* 95  BUN 19   < > $R'15 12 19  'SJ$ CREATININE 0.77   < > 0.82 0.93 0.85  CALCIUM 8.7*   < > 8.6* 8.9 8.4*  GFRNONAA >60   < > >60 >60 >60  PROT 6.8   < > 6.6 7.0 6.9  ALBUMIN 3.8   < > 3.6 3.7 3.7  AST 20   < > $R'18 19 18  'ld$ ALT 15   < > $R'13 13 14  'xt$ ALKPHOS 59   < > 66 77 74  BILITOT 0.8   < > 0.5 0.7 0.7  BILIDIR 0.1  --   --   --   --   IBILI 0.7  --   --   --   --    < > = values in this interval not displayed.    Iron/TIBC/Ferritin/ %Sat    Component Value Date/Time   IRON 72 03/31/2021 0856   TIBC 364 03/31/2021 0856   FERRITIN 291 03/31/2021 0856   IRONPCTSAT 20 03/31/2021 0856      RADIOGRAPHIC STUDIES: I have personally reviewed the radiological images as listed and agreed with the findings in the report. ECHOCARDIOGRAM COMPLETE  Result Date: 11/26/2021    ECHOCARDIOGRAM REPORT   Patient Name:   Amber Barnes Date of Exam: 11/26/2021 Medical Rec #:  [id number]        Height:       65.0 in Accession #:    [id number]       Weight:       259.0 lb Date of Birth:  [birth year]        BSA:          2.208 m Patient Age:    72 years         BP:           162/88 mmHg Patient Gender: F                HR:           53 bpm. Exam Location:  [facility] Procedure: 2D Echo, Cardiac Doppler, Color Doppler and Strain Analysis Indications:    R60.0 Lower extremity edema  History:        Patient has prior history of Echocardiogram examinations, most  recent 07/29/2021. COPD, Arrythmias:Atrial Fibrillation,                 Signs/Symptoms:Edema; Risk Factors:Dyslipidemia, Hypertension,                 Sleep Apnea, Former Smoker and                  Trastuzumab + radiation.  Sonographer:    Pilar Jarvis RDMS, RVT, RDCS Referring Phys: [place] Comments: Suboptimal parasternal window. IMPRESSIONS  1. Left ventricular ejection fraction, by estimation, is 55 to 60%. The left ventricle has normal function. The left ventricle has no regional  wall motion abnormalities. Left ventricular diastolic parameters are consistent with Grade I diastolic dysfunction (impaired relaxation). The average left ventricular global longitudinal strain is -18.7 %. The global longitudinal strain is normal.  2. Right ventricular systolic function is normal. The right ventricular size is normal. Tricuspid regurgitation signal is inadequate for assessing PA pressure.  3. Left atrial size was mildly dilated.  4. The mitral valve is normal in structure. Mild mitral valve regurgitation. No evidence of mitral stenosis.  5. The aortic valve is normal in structure. Aortic valve regurgitation is not visualized. No aortic stenosis is present.  6. The inferior vena cava is normal in size with greater than 50% respiratory variability, suggesting right atrial pressure of 3 mmHg. FINDINGS  Left Ventricle: Left ventricular ejection fraction, by estimation, is 55 to 60%. The left ventricle has normal function. The left ventricle has no regional wall motion abnormalities. The average left ventricular global longitudinal strain is -18.7 %. The global longitudinal strain is normal. The left ventricular internal cavity size was normal in size. There is no left ventricular hypertrophy. Left ventricular diastolic parameters are consistent with Grade I diastolic dysfunction (impaired relaxation). Right Ventricle: The right ventricular size is normal. No increase in right ventricular wall thickness. Right ventricular systolic function is normal. Tricuspid regurgitation signal is inadequate for assessing PA pressure. Left Atrium: Left atrial size was mildly dilated. Right Atrium: Right atrial size was normal in size. Pericardium: There is no evidence of pericardial effusion. Mitral Valve: The mitral valve is normal in structure. Mild mitral annular calcification. Mild mitral valve regurgitation. No evidence of mitral valve stenosis. Tricuspid Valve: The tricuspid valve is normal in structure.  Tricuspid valve regurgitation is not demonstrated. No evidence of tricuspid stenosis. Aortic Valve: The aortic valve is normal in structure. Aortic valve regurgitation is not visualized. No aortic stenosis is present. Aortic valve mean gradient measures 4.0 mmHg. Aortic valve peak gradient measures 8.0 mmHg. Aortic valve area, by VTI measures 2.65 cm. Pulmonic Valve: The pulmonic valve was normal in structure. Pulmonic valve regurgitation is mild. No evidence of pulmonic stenosis. Aorta: The aortic root is normal in size and structure. Venous: The inferior vena cava is normal in size with greater than 50% respiratory variability, suggesting right atrial pressure of 3 mmHg. IAS/Shunts: No atrial level shunt detected by color flow Doppler.  LEFT VENTRICLE PLAX 2D LVIDd:         5.40 cm      Diastology LVIDs:         3.70 cm      LV e' medial:    4.90 cm/s LV PW:         1.10 cm      LV E/e' medial:  13.0 LV IVS:        0.90 cm      LV e'  lateral:   6.20 cm/s LVOT diam:     2.10 cm      LV E/e' lateral: 10.3 LV SV:         89 LV SV Index:   40           2D Longitudinal Strain LVOT Area:     3.46 cm     2D Strain GLS Avg:     -18.7 %  LV Volumes (MOD) LV vol d, MOD A2C: 91.5 ml LV vol d, MOD A4C: 120.0 ml LV vol s, MOD A2C: 45.0 ml LV vol s, MOD A4C: 53.0 ml LV SV MOD A2C:     46.5 ml LV SV MOD A4C:     120.0 ml LV SV MOD BP:      58.5 ml RIGHT VENTRICLE             IVC RV Basal diam:  4.20 cm     IVC diam: 1.50 cm RV Mid diam:    3.70 cm RV S prime:     11.10 cm/s TAPSE (M-mode): 2.0 cm LEFT ATRIUM             Index        RIGHT ATRIUM           Index LA diam:        4.30 cm 1.95 cm/m   RA Area:     19.30 cm LA Vol (A2C):   58.3 ml 26.41 ml/m  RA Volume:   53.10 ml  24.05 ml/m LA Vol (A4C):   60.8 ml 27.54 ml/m LA Biplane Vol: 61.0 ml 27.63 ml/m  AORTIC VALVE                    PULMONIC VALVE AV Area (Vmax):    2.34 cm     PV Vmax:          1.15 m/s AV Area (Vmean):   2.01 cm     PV Peak grad:     5.3 mmHg AV  Area (VTI):     2.65 cm     PR End Diast Vel: 5.11 msec AV Vmax:           141.00 cm/s AV Vmean:          98.400 cm/s AV VTI:            0.335 m AV Peak Grad:      8.0 mmHg AV Mean Grad:      4.0 mmHg LVOT Vmax:         95.30 cm/s LVOT Vmean:        57.100 cm/s LVOT VTI:          0.256 m LVOT/AV VTI ratio: 0.76  AORTA Ao Root diam: 3.40 cm Ao Asc diam:  3.60 cm Ao Arch diam: 2.1 cm MITRAL VALVE MV Area (PHT): 3.00 cm    SHUNTS MV Decel Time: 253 msec    Systemic VTI:  0.26 m MV E velocity: 63.80 cm/s  Systemic Diam: 2.10 cm MV A velocity: 79.70 cm/s MV E/A ratio:  0.80 Ida Rogue MD Electronically signed by Ida Rogue MD Signature Date/Time: 11/26/2021/12:17:58 PM    Final       ASSESSMENT & PLAN:  1. HER2-positive carcinoma of breast ([place])   2. Encounter for monoclonal antibody treatment for malignancy   3. Neuropathy   4. Leg swelling    Cancer Staging  Malignant neoplasm of upper-outer quadrant of right female breast (  [place]) Staging form: Breast, AJCC 8th Edition - Pathologic stage from 03/03/2021: Stage IA (pT1b, pN0, cM0, G3, ER-, PR+, HER2+) - Signed by [name], MD on 03/03/2021  #Right breast invasive carcinoma pT1b pN0 ER negative, PR weakly positive HER 2 positive.  Patient does not tolerate adjuvant chemotherapy with Taxol and trastuzumab despite Taxol dose reduction.-->Status post adjuvant radiation--> maintenance trastuzumab Labs reviewed and discussed with Echocardiogram results reviewed with patient.  LVEF is slightly decreased compared to last echo, still within normal limits. Proceed with trastuzumab maintenance   #Lower extremity edema, currently on Lasix 40 mg daily.  Grade 1 diastolic disc unction, follow-up with Dr. [name]. 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  Recommend gabapentin 100 mg 3 times daily.  She has previously tolerated gabapentin in the past for restless leg syndrome few years  ago.  Supportive care measures are necessary for patient well-being and will be provided as necessary. We spent sufficient time to discuss many aspect of care, questions were answered to patient's satisfaction.   All questions were answered. The patient knows to call the clinic with any problems questions or concerns.  cc [name], FNP  Follow-up 3 weeks lab MD trastuzumab. 12/08/2021

## 2021-12-09 ENCOUNTER — Ambulatory Visit (INDEPENDENT_AMBULATORY_CARE_PROVIDER_SITE_OTHER): Payer: Medicare Other

## 2021-12-09 VITALS — Ht 65.0 in | Wt 262.0 lb

## 2021-12-09 DIAGNOSIS — Z0000 Encounter for general adult medical examination without abnormal findings: Secondary | ICD-10-CM

## 2021-12-09 NOTE — Progress Notes (Signed)
Subjective:   Amber Barnes is a 71 y.o. female who presents for Medicare Annual (Subsequent) preventive examination.  Review of Systems    No ROS.  Medicare Wellness Virtual Visit.  Visual/audio telehealth visit, UTA vital signs.   See social history for additional risk factors.   Cardiac Risk Factors include: advanced age (>45mn, >>59women);hypertension     Objective:    Today's Vitals   12/09/21 1034  Weight: 262 lb (118.8 kg)  Height: 5' 5"  (1.651 m)   Body mass index is 43.6 kg/m.     12/09/2021   10:45 AM 12/08/2021   12:59 PM 11/17/2021    9:14 AM 10/27/2021    9:11 AM 09/29/2021    2:01 PM 08/05/2021    9:30 AM 07/27/2021   11:00 AM  Advanced Directives  Does Patient Have a Medical Advance Directive? Yes Yes Yes Yes Yes Yes Yes  Type of AParamedicof [place]Living will   [place] will Living will;Healthcare Power of AMcLeanLiving will HHawkeyeLiving will  Does patient want to make changes to medical advance directive? No - Patient declined     No - Patient declined No - Patient declined  Copy of HBret Hartein Chart? No - copy requested   No - copy requested  No - copy requested No - copy requested  Would patient like information on creating a medical advance directive?      No - Patient declined     Current Medications (verified) Outpatient Encounter Medications as of 12/09/2021  Medication Sig   acetaminophen (TYLENOL) 500 MG tablet Take 500-1,000 mg by mouth every 6 (six) hours as needed for mild pain or moderate pain.   ALPRAZolam (XANAX) 0.25 MG tablet TAKE 1 TABLET BY MOUTH DAILY AS NEEDED FOR ANXIETY OR SLEEP.   apixaban (ELIQUIS) 5 MG TABS tablet Take 5 mg by mouth 2 (two) times daily.   azelastine (ASTELIN) 0.1 % nasal spray Place 1-2 sprays into both nostrils daily as needed for rhinitis.   bisoprolol (ZEBETA) 5 MG tablet Take 5 mg by  mouth in the morning and at bedtime.   busPIRone (BUSPAR) 5 MG tablet TAKE 1 TABLET BY MOUTH THREE TIMES A DAY AS NEEDED   Cholecalciferol (VITAMIN D3) 5000 units CAPS Take 5,000 Units by mouth daily.   clotrimazole (LOTRIMIN) 1 % cream Apply 1 application topically 2 (two) times daily.   diphenhydrAMINE (BENADRYL) 25 mg capsule Take 1 capsule (25 mg total) by mouth every 8 (eight) hours as needed for itching or allergies.   DULoxetine (CYMBALTA) 30 MG capsule TAKE 2 CAPSULES BY MOUTH EVERY DAY FOR TOTAL DOSE OF 60MG   esomeprazole (NEXIUM) 20 MG capsule Take 20 mg by mouth daily as needed (GERD symptoms).   fluconazole (DIFLUCAN) 150 MG tablet Take 1 tablet (150 mg total) by mouth once a week. X 2-4 weeks   furosemide (LASIX) 20 MG tablet TAKE 1 TABLET BY MOUTH TWICE A DAY   gabapentin (NEURONTIN) 100 MG capsule Take 1 capsule (100 mg total) by mouth 3 (three) times daily.   hydrocortisone 2.5 % ointment Apply topically 2 (two) times daily. prn   KLOR-CON M20 20 MEQ tablet TAKE 1 TABLET BY MOUTH EVERY DAY   lidocaine-prilocaine (EMLA) cream Apply to affected area once   loperamide (IMODIUM) 2 MG capsule Take 1 capsule (2 mg total) by mouth See admin instructions. Initial: 4 mg, followed by 2  mg after each loose stool; maximum: 16 mg/day   Multiple Vitamin (MULTIVITAMIN) tablet Take 1 tablet by mouth daily.   nystatin (MYCOSTATIN/NYSTOP) powder Apply 1 application. topically 3 (three) times daily.   nystatin cream (MYCOSTATIN) Apply 1 application. topically 2 (two) times daily. Prn abdomen   ondansetron (ZOFRAN) 8 MG tablet Take 1 tablet (8 mg total) by mouth every 8 (eight) hours as needed for nausea or vomiting.   prochlorperazine (COMPAZINE) 10 MG tablet Take 1 tablet (10 mg total) by mouth every 6 (six) hours as needed (Nausea or vomiting).   promethazine (PHENERGAN) 25 MG tablet Take 1 tablet (25 mg total) by mouth every 6 (six) hours as needed for nausea or vomiting.   ranolazine (RANEXA)  500 MG 12 hr tablet TAKE 1 TABLET BY MOUTH TWICE A DAY   rizatriptan (MAXALT) 10 MG tablet TAKE 1 TABLET (10 MG TOTAL) BY MOUTH AS NEEDED FOR MIGRAINE. MAY REPEAT IN 2 HOURS IF NEEDED   rOPINIRole (REQUIP) 2 MG tablet Take 1 tablet (2 mg total) by mouth at bedtime.   rosuvastatin (CRESTOR) 20 MG tablet TAKE 1 TABLET BY MOUTH EVERY DAY   silver sulfADIAZINE (SILVADENE) 1 % cream Apply 1 application. topically 2 (two) times daily.   No facility-administered encounter medications on file as of 12/09/2021.    Allergies (verified) Prednisone, Amiodarone, Hydrocodone-acetaminophen, Oxycodone, Tapentadol, Tramadol, Adhesive [tape], and Latex   History: Past Medical History:  Diagnosis Date   Achilles tendinitis    Acute medial meniscal tear    Allergy    Anxiety    Atrial fibrillation ([place])    a. new onset 07/2016; b. CHADS2VASc --> 4 (HTN, female, age, CHF); c. eliquis   CHF (congestive heart failure) (HCC)    Chronic anticoagulation    Apixaban   Chronic lower back pain    Colon polyp    Complication of anesthesia    Emergence delirium; hypoxia   COPD (chronic obstructive pulmonary disease) ([place])    Cystic breast    Depression    Endometriosis    Fibrocystic breast disease    GERD (gastroesophageal reflux disease)    History of stress test    a. 07/2016 MV: EF 55-65%. Small, mild defect  in mid anteroseptal, apical septal, and apical locations. No ischemia-->low risk.   Hyperlipidemia    Hypertension    Insomnia    Lipoma    Malignant neoplasm of upper-outer quadrant of right female breast (HCC)    Stage 1A invasive mammary carcinoma (cT1b or T1c N0); ER (-), PR (+), HER2/neu (+)   Migraine    Mild Mitral regurgitation    a. 07/2016 Echo: EF 55-65%, no rwma, mild MR. Mildly dil LA. Nl RV fx.    Nephrolithiasis    OSA (obstructive sleep apnea)    non-compliant with nocturnal PAP therapy   Osteoarthritis    left knee   Osteopenia    Peptic ulcer disease    a. H. pylori    Pneumonia    RLS (restless legs syndrome)    Tendonitis    Thyroid disease    Vitamin D deficiency    Past Surgical History:  Procedure Laterality Date   ABDOMINAL HYSTERECTOMY     ovaries left   ACHILLES TENDON SURGERY     2012,01/2017   BACK SURGERY     fusion lumbar   BREAST BIOPSY Right 01/15/2021   Affirm biopsy-"X" clip-INVASIVE MAMMARY CARCINOMA   BREAST BIOPSY Right 01/15/2021   u/s bx-"venus" clip INVASIVE MAMMARY  CARCINOMA   BREAST BIOPSY     BREAST LUMPECTOMY,RADIO FREQ LOCALIZER,AXILLARY SENTINEL LYMPH NODE BIOPSY Right 02/17/2021   Procedure: BREAST LUMPECTOMY,RADIO FREQ LOCALIZER,AXILLARY SENTINEL LYMPH NODE BIOPSY;  Surgeon: Ronny Bacon, MD;  Location: [facility];  Service: General;  Laterality: Right;   CARDIOVERSION N/A 09/07/2016   Procedure: CARDIOVERSION;  Surgeon: Minna Merritts, MD;  Location: [facility];  Service: Cardiovascular;  Laterality: N/A;   CARDIOVERSION N/A 10/19/2016   Procedure: CARDIOVERSION;  Surgeon: Minna Merritts, MD;  Location: [facility];  Service: Cardiovascular;  Laterality: N/A;   CARDIOVERSION N/A 12/14/2016   Procedure: CARDIOVERSION;  Surgeon: Minna Merritts, MD;  Location: [facility];  Service: Cardiovascular;  Laterality: N/A;   COLONOSCOPY     COLONOSCOPY WITH PROPOFOL N/A 05/13/2020   Procedure: COLONOSCOPY WITH PROPOFOL;  Surgeon: Toledo, Benay Pike, MD;  Location: [facility];  Service: Gastroenterology;  Laterality: N/A;  ELIQUIS   CYSTOSCOPY WITH STENT PLACEMENT Bilateral 07/12/2021   Procedure: CYSTOSCOPY WITH STENT PLACEMENT;  Surgeon: Billey Co, MD;  Location: [facility];  Service: Urology;  Laterality: Bilateral;   CYSTOSCOPY/URETEROSCOPY/HOLMIUM LASER/STENT PLACEMENT Bilateral 07/30/2021   Procedure: CYSTOSCOPY/URETEROSCOPY/HOLMIUM LASER/BILATERAL STENT REMOVAL;  Surgeon: Billey Co, MD;  Location: [facility];  Service: Urology;  Laterality: Bilateral;   ESOPHAGOGASTRODUODENOSCOPY     ESOPHAGOGASTRODUODENOSCOPY  (EGD) WITH PROPOFOL N/A 05/13/2020   Procedure: ESOPHAGOGASTRODUODENOSCOPY (EGD) WITH PROPOFOL;  Surgeon: Toledo, Benay Pike, MD;  Location: [facility];  Service: Gastroenterology;  Laterality: N/A;   FOOT SURGERY Left    tendon   JOINT REPLACEMENT Right    hip   PORTA CATH INSERTION N/A 03/23/2021   Procedure: PORTA CATH INSERTION;  Surgeon: Katha Cabal, MD;  Location: [facility];  Service: Cardiovascular;  Laterality: N/A;   PORTACATH PLACEMENT N/A 03/23/2021   Procedure: ATTEMPTED INSERTION PORT-A-CATH;  Surgeon: Ronny Bacon, MD;  Location: [facility];  Service: General;  Laterality: N/A;   THYROIDECTOMY     thyroid lobectomy secondary to a benign nodule   TOTAL HIP ARTHROPLASTY Right 08/21/2018   Procedure: TOTAL HIP ARTHROPLASTY ANTERIOR APPROACH-RIGHT CELL SAVER REQUESTED;  Surgeon: [name], MD;  Location: [facility];  Service: Orthopedics;  Laterality: Right;   Family History  Problem Relation Age of Onset   Cancer Sister 48       ovarian   Depression Sister    Cancer Brother        CLL   Cancer Father 21       colon   Heart disease Father    Heart attack Father 63       died from MI   Hyperlipidemia Father    Hypertension Father    Cancer Mother        leukemia   Breast cancer Paternal Aunt        65's   Migraines Neg Hx    Social History   Socioeconomic History   Marital status: Married    Spouse name: jeffrey   Number of children: 1   Years of education: Not on file   Highest education level: High school graduate  Occupational History    Comment: disabled  Tobacco Use   Smoking status: Former    Packs/day: 0.25    Years: 10.00    Pack years: 2.50    Types: Cigarettes    Quit date: 2005    Years since quitting: 18.4   Smokeless tobacco: Never   Tobacco comments:    smoked for 10 years, 1 pack  every 2-3 days  Vaping Use   Vaping Use: Never used  Substance and Sexual Activity   Alcohol use: Not Currently    Alcohol/week: 1.0  standard drink    Types: 1 Glasses of wine per week    Comment: rarely   Drug use: No   Sexual activity: Not Currently  Other Topics Concern   Not on file  Social History Narrative   Moved to [place] from [place], originally from [place].    1 cup of caffeine daily   Right handed   Lives with husband, step-daughter and child       Social Determinants of Health   Financial Resource Strain: Low Risk    Difficulty of Paying Living Expenses: Not very hard  Food Insecurity: No Food Insecurity   Worried About Charity fundraiser in the Last Year: Never true   Ran Out of Food in the Last Year: Never true  Transportation Needs: No Transportation Needs   Lack of Transportation (Medical): No   Lack of Transportation (Non-Medical): No  Physical Activity: Insufficiently Active   Days of Exercise per Week: 7 days   Minutes of Exercise per Session: 20 min  Stress: Stress Concern Present   Feeling of Stress : Very much  Social Connections: Moderately Isolated   Frequency of Communication with Friends and Family: Never   Frequency of Social Gatherings with Friends and Family: Once a week   Attends Religious Services: More than 4 times per year   Active Member of [employer] or Organizations: No   Attends Music therapist: Never   Marital Status: Married    Tobacco Counseling Counseling given: Not Answered Tobacco comments: smoked for 10 years, 1 pack every 2-3 days   Clinical Intake:  Pre-visit preparation completed: Yes        Diabetes: No  How often do you need to have someone help you when you read instructions, pamphlets, or other written materials from your doctor or pharmacy?: 1 - Never  Interpreter Needed?: No      Activities of Daily Living    12/09/2021   10:37 AM 07/27/2021   10:59 AM  In your present state of health, do you have any difficulty performing the following activities:  Hearing? 0 0  Vision? 0 0  Difficulty  concentrating or making decisions? 1 0  Comment Notes difficulty remembering right now due to chemotherapy   Walking or climbing stairs? 1 1  Comment Chronic R knee pain   Dressing or bathing? 0 0  Doing errands, shopping? 0 1  Preparing Food and eating ? N   Using the Toilet? N   In the past six months, have you accidently leaked urine? Y   Comment Managed with incontinence brief   Do you have problems with loss of bowel control? N   Managing your Medications? N   Managing your Finances? N   Housekeeping or managing your Housekeeping? N   Comment Pace self with activity     Patient Care Team: [name], FNP as PCP - General (Family Medicine) [name] [name], MD as PCP - Cardiology (Cardiology) [name] [name], MD as Consulting Physician (Cardiology) Rico Junker, RN as Oncology Nurse Navigator [name], MD as Consulting Physician (Hematology and Oncology) Billey Co, MD as Consulting Physician (Urology) Noreene Filbert, MD as Consulting Physician (Radiation Oncology) Ronny Bacon, MD as Consulting Physician (General Surgery)  Indicate any recent Medical Services you may have received from  other than [facility] providers in the past year (date may be approximate).     Assessment:   This is a routine wellness examination for [place].  Virtual Visit via Telephone Note  I connected with  [name] on 12/09/21 at 10:30 AM EDT by telephone and verified that I am speaking with the correct person using two identifiers.  Persons participating in the virtual visit: patient/Nurse Health Advisor   I discussed the limitations of performing an evaluation and management service by telehealth. We continued and completed visit with audio only. Some vital signs may be absent or patient reported.   Hearing/Vision screen Hearing Screening - Comments:: Patient is able to hear conversational tones without difficulty. No issues reported. Vision Screening -  Comments:: Wears corrective lenses They have seen their ophthalmologist.   Dietary issues and exercise activities discussed: Current Exercise Habits: Home exercise routine, Type of exercise: stretching, Time (Minutes): 20, Frequency (Times/Week): 7, Weekly Exercise (Minutes/Week): 140, Intensity: Mild Low sodium diet Monitor fluid intake   Goals Addressed               This Visit's Progress     Patient Stated     Weight loss goal 20lb (pt-stated)        Portion control meals Increase physical activity with stationary bike as tolerated Walking water aerobics Add 2lb strength training weights       Depression Screen    12/09/2021   11:01 AM 10/28/2021    9:15 AM 06/21/2021    1:49 PM 06/21/2021   10:15 AM 01/06/2021   12:22 PM 11/27/2020    2:12 PM 09/09/2020   11:34 AM  PHQ 2/9 Scores  PHQ - 2 Score 1 0  2 2 0   PHQ- 9 Score     8    Exception Documentation   Other- indicate reason in comment box    Other- indicate reason in comment box  Not completed   Verbally discuss with patient today.    Discussed with patient verbally.    Fall Risk    12/09/2021   10:48 AM 10/28/2021    9:15 AM 03/02/2021    1:36 PM 11/27/2020    2:19 PM 12/18/2019    2:31 PM  Fall Risk   Falls in the past year? 1 0 0 0 0  Number falls in past yr: 0 0  0 0  Injury with Fall?  0  0 0  Risk for fall due to : Impaired balance/gait No Fall Risks     Risk for fall due to: Comment Chronic R knee pain      Follow up Falls evaluation completed Falls evaluation completed  Falls evaluation completed Falls evaluation completed    [place]: Home free of loose throw rugs in walkways, pet beds, electrical cords, etc? Yes  Adequate lighting in your home to reduce risk of falls? Yes   ASSISTIVE DEVICES UTILIZED TO PREVENT FALLS: Life alert? No  Use of a cane, walker or w/c? Yes  Grab bars in the bathroom? Yes  Shower chair or bench in shower? Yes  Elevated toilet seat or  a handicapped toilet? Yes   TIMED UP AND GO: Was the test performed? No .   Cognitive Function:  Patient is alert and oriented x3.  Enjoys playing scrabble, jeopardy, solitaire and other brain stimulating activities.  Some difficulty remembering due to chemotherapy. Manages her own medications and finances without difficulty.  11/27/2019   11:53 AM  6CIT Screen  What Year? 0 points  What month? 0 points  Months in reverse 0 points  Repeat phrase 0 points    Immunizations Immunization History  Administered Date(s) Administered   Fluad Quad(high Dose 65+) 04/08/2019, 03/30/2020   Influenza Inj Mdck Quad Pf 04/03/2017   Influenza, High Dose Seasonal PF 03/05/2021   Influenza,inj,Quad PF,6+ Mos 04/14/2016, 03/30/2018   Influenza-Unspecified 03/07/2019   PFIZER Comirnaty(Gray Top)Covid-19 Tri-Sucrose Vaccine 01/06/2021   PFIZER(Purple Top)SARS-COV-2 Vaccination 08/23/2019, 09/18/2019, 06/02/2020   PNEUMOCOCCAL CONJUGATE-20 01/26/2021   Pneumococcal Conjugate-13 03/30/2018   Pneumococcal Polysaccharide-23 04/18/2019   Pneumococcal-Unspecified 03/23/2012   Td 11/01/2012   Tdap 03/05/2021   Zoster Recombinat (Shingrix) 01/06/2021, 03/05/2021   Zoster, Live 10/07/2012   Screening Tests Health Maintenance  Topic Date Due   COVID-19 Vaccine (5 - Booster for [place] series) 12/25/2021 (Originally 03/03/2021)   INFLUENZA VACCINE  02/08/2022   MAMMOGRAM  12/31/2022   COLONOSCOPY (Pts 45-76yr Insurance coverage will need to be confirmed)  05/14/2023   TETANUS/TDAP  03/06/2031   Pneumonia Vaccine 69 Years old  Completed   DEXA SCAN  Completed   Hepatitis C Screening  Completed   Zoster Vaccines- Shingrix  Completed   HPV VACCINES  Aged Out   Health Maintenance There are no preventive care reminders to display for this patient.  Lung Cancer Screening: (Low Dose CT Chest recommended if Age 509-80years, 30 pack-year currently smoking OR have quit w/in 15years.) does not  qualify.   Vision Screening: Recommended annual ophthalmology exams for early detection of glaucoma and other disorders of the eye.  Dental Screening: Recommended annual dental exams for proper oral hygiene  Community Resource Referral / Chronic Care Management: CRR required this visit?  No   CCM required this visit?  No      Plan:     I have personally reviewed and noted the following in the patient's chart:   Medical and social history Use of alcohol, tobacco or illicit drugs  Current medications and supplements including opioid prescriptions.  Functional ability and status Nutritional status Physical activity Advanced directives List of other physicians Hospitalizations, surgeries, and ER visits in previous 12 months Vitals Screenings to include cognitive, depression, and falls Referrals and appointments  In addition, I have reviewed and discussed with patient certain preventive protocols, quality metrics, and best practice recommendations. A written personalized care plan for preventive services as well as general preventive health recommendations were provided to patient.     OVarney Biles LPN   [id number]

## 2021-12-09 NOTE — Patient Instructions (Addendum)
  Amber Barnes , Thank you for taking time to come for your Medicare Wellness Visit. I appreciate your ongoing commitment to your health goals. Please review the following plan we discussed and let me know if I can assist you in the future.   These are the goals we discussed:  Goals       Patient Stated     Weight loss goal 20lb (pt-stated)      Portion control meals Increase physical activity with stationary bike as tolerated Walking water aerobics Add 2lb strength training weights        This is a list of the screening recommended for you and due dates:  Health Maintenance  Topic Date Due   COVID-19 Vaccine (5 - Booster for Pfizer series) 12/25/2021*   Flu Shot  02/08/2022   Mammogram  12/31/2022   Colon Cancer Screening  05/14/2023   Tetanus Vaccine  03/06/2031   Pneumonia Vaccine  Completed   DEXA scan (bone density measurement)  Completed   Hepatitis C Screening: USPSTF Recommendation to screen - Ages 32-79 yo.  Completed   Zoster (Shingles) Vaccine  Completed   HPV Vaccine  Aged Out  *Topic was postponed. The date shown is not the original due date.

## 2021-12-10 ENCOUNTER — Ambulatory Visit: Payer: Medicare Other | Admitting: Family

## 2021-12-10 ENCOUNTER — Encounter: Payer: Self-pay | Admitting: Family

## 2021-12-10 VITALS — BP 132/82 | HR 69 | Temp 97.7°F | Ht 65.0 in | Wt 259.0 lb

## 2021-12-10 DIAGNOSIS — F419 Anxiety disorder, unspecified: Secondary | ICD-10-CM | POA: Diagnosis not present

## 2021-12-10 DIAGNOSIS — F32A Depression, unspecified: Secondary | ICD-10-CM | POA: Diagnosis not present

## 2021-12-10 DIAGNOSIS — I10 Essential (primary) hypertension: Secondary | ICD-10-CM

## 2021-12-10 DIAGNOSIS — R42 Dizziness and giddiness: Secondary | ICD-10-CM | POA: Insufficient documentation

## 2021-12-10 MED ORDER — ALPRAZOLAM 0.25 MG PO TABS: 0.2500 mg | ORAL_TABLET | Freq: Two times a day (BID) | ORAL | 2 refills | Status: DC | PRN

## 2021-12-10 MED ORDER — FLUOXETINE HCL 20 MG PO CAPS: 20.0000 mg | ORAL_CAPSULE | Freq: Every morning | ORAL | 3 refills | Status: DC

## 2021-12-10 MED ORDER — MECLIZINE HCL 12.5 MG PO TABS: 12.5000 mg | ORAL_TABLET | Freq: Three times a day (TID) | ORAL | 0 refills | Status: DC | PRN

## 2021-12-10 NOTE — Progress Notes (Signed)
Stress and depression

## 2021-12-10 NOTE — Assessment & Plan Note (Deleted)
She is not orthostatic based on vitals.

## 2021-12-10 NOTE — Assessment & Plan Note (Addendum)
She is not orthostatic based on vitals. Reassuring neurologic exam.  Presentation most consistent with vertigo.  I opted not perform Dix-Hallpike maneuver today as she is felt vertigo when she changed from lying to sitting while obtaining orthostatic vitals.  Trial of meclizine 12.'5mg'$ .  CT head obtained 04/05/2021 without acute findings.  We will discuss repeat neuroimaging if symptom continues.  We will also discuss consult with ENT.  close follow up

## 2021-12-10 NOTE — Patient Instructions (Addendum)
Start taking buspar '5mg'$  three time per day I have refilled xanax 0.'25mg'$  to twice to day Please start taking Cymbalta 30 mg (a decrease from 60 mg) for the next 5 days.  Then you may stop the medication completely.    Once off of cymbalta, you may start Prozac 20 mg.  Trial of meclizine for vertigo  Vertigo Vertigo is the feeling that you or your surroundings are moving when they are not. This feeling can come and go at any time. Vertigo often goes away on its own. Vertigo can be dangerous if it occurs while you are doing something that could endanger yourself or others, such as driving or operating machinery. Your health care provider will do tests to try to determine the cause of your vertigo. Tests will also help your health care provider decide how best to treat your condition. Follow these instructions at home: Eating and drinking     Dehydration can make vertigo worse. Drink enough fluid to keep your urine pale yellow. Do not drink alcohol. Activity Return to your normal activities as told by your health care provider. Ask your health care provider what activities are safe for you. In the morning, first sit up on the side of the bed. When you feel okay, stand slowly while you hold onto something until you know that your balance is fine. Move slowly. Avoid sudden body or head movements or certain positions, as told by your health care provider. If you have trouble walking or keeping your balance, try using a cane for stability. If you feel dizzy or unstable, sit down right away. Avoid doing any tasks that would cause danger to you or others if vertigo occurs. Avoid bending down if you feel dizzy. Place items in your home so that they are easy for you to reach without bending or leaning over. Do not drive or use machinery if you feel dizzy. General instructions Take over-the-counter and prescription medicines only as told by your health care provider. Keep all follow-up visits. This is  important. Contact a health care provider if: Your medicines do not relieve your vertigo or they make it worse. Your condition gets worse or you develop new symptoms. You have a fever. You develop nausea or vomiting, or if nausea gets worse. Your family or friends notice any behavioral changes. You have numbness or a prickling and tingling sensation in part of your body. Get help right away if you: Are always dizzy or you faint. Develop severe headaches. Develop a stiff neck. Develop sensitivity to light. Have difficulty moving or speaking. Have weakness in your hands, arms, or legs. Have changes in your hearing or vision. These symptoms may represent a serious problem that is an emergency. Do not wait to see if the symptoms will go away. Get medical help right away. Call your local emergency services (911 in the [place] Do not drive yourself to the hospital. Summary Vertigo is the feeling that you or your surroundings are moving when they are not. Your health care provider will do tests to try to determine the cause of your vertigo. Follow instructions for home care. You may be told to avoid certain tasks, positions, or movements. Contact a health care provider if your medicines do not relieve your symptoms, or if you have a fever, nausea, vomiting, or changes in behavior. Get help right away if you have severe headaches or difficulty speaking, or you develop hearing or vision problems. This information is not intended to replace advice given  to you by your health care provider. Make sure you discuss any questions you have with your health care provider. Document Revised: 05/27/2020 Document Reviewed: 05/27/2020 Elsevier Patient Education  [place].

## 2021-12-10 NOTE — Progress Notes (Signed)
Subjective:    Patient ID: Amber Barnes, female    DOB: Apr 11, 1951, 71 y.o.   MRN: [id number]  CC: Amber Barnes is a 71 y.o. female who presents today for follow up.   HPI:  Complains of vertigo when  lays in bed or turns or her head in bed. 'room is going around' . She also describes feeling 'dizzy' when she gets up too fast. Symptom is not present today.  She has had vertigo in the past.  No head injury.  No nausea, syncope, congestion, hearing loss, ear pain.  She has yet to start gabapentin for peripheral neuropathy as recently prescribed by Dr Tasia Catchings.    Complains of worsening anxiety and irritability. Compliant with xanax 0.44m qhs which is helpful. Worries about health and recurrence of breast cancer. She worries about her husband's health.  She is compliant with cymbalta 6102m buspar 48m34mID. No si/hi. No suicide plan.   She is exercising at home. She is going to start stationary bike. Energy has improved.   Chronic left knee pain and she is awaiting TKR. She is using cane and wheelchair for longer distances.    Continues to follow with Dr. Yu Tasia Catchingsr breast cancer Last seen by cardiology Dr. GolRockey Situ1/2023 for chronic diastolic CHF, paroxysmal atrial fibrillation.  Diltiazem has been held for leg swelling.  She is no longer on amiodarone.  She is compliant with Ranexa, Eliquis, bisoprolol 48mg40md.  She is compliant with Lasix 20mg50m  echocardiogram repeated 11/2021 with normal LV HISTORY:  Past Medical History:  Diagnosis Date  . Achilles tendinitis   . Acute medial meniscal tear   . Allergy   . Anxiety   . Atrial fibrillation (HCC) [place]. new onset 07/2016; b. CHADS2VASc --> 4 (HTN, female, age, CHF); c. eliquis  . CHF (congestive heart failure) (HCC) [place] Chronic anticoagulation    Apixaban  . Chronic lower back pain   . Colon polyp   . Complication of anesthesia    Emergence delirium; hypoxia  . COPD (chronic obstructive pulmonary disease) (HCC) [place] Cystic  breast   . Depression   . Endometriosis   . Fibrocystic breast disease   . GERD (gastroesophageal reflux disease)   . History of stress test    a. 07/2016 MV: EF 55-65%. Small, mild defect  in mid anteroseptal, apical septal, and apical locations. No ischemia-->low risk.  . HypMarland Kitchenrlipidemia   . Hypertension   . Insomnia   . Lipoma   . Malignant neoplasm of upper-outer quadrant of right female breast (HCC[place] invasive mammary carcinoma (cT1b or T1c N0); ER (-), PR (+), HER2/neu (+)  . Migraine   . Mild Mitral regurgitation    a. 07/2016 Echo: EF 55-65%, no rwma, mild MR. Mildly dil LA. Nl RV fx.   . Nephrolithiasis   . OSA (obstructive sleep apnea)    non-compliant with nocturnal PAP therapy  . Osteoarthritis    left knee  . Osteopenia   . Peptic ulcer disease    a. H. pylori  . Pneumonia   . RLS (restless legs syndrome)   . Tendonitis   . Thyroid disease   . Vitamin D deficiency    Past Surgical History:  Procedure Laterality Date  . ABDOMINAL HYSTERECTOMY     ovaries left  . ACHILLES TENDON SURGERY     2012,01/2017  . BACK SURGERY     fusion lumbar  . BREAST BIOPSY  Right 01/15/2021   Affirm biopsy-"X" clip-INVASIVE MAMMARY CARCINOMA  . BREAST BIOPSY Right 01/15/2021   u/s bx-"venus" clip INVASIVE MAMMARY CARCINOMA  . BREAST BIOPSY    . BREAST LUMPECTOMY,RADIO FREQ LOCALIZER,AXILLARY SENTINEL LYMPH NODE BIOPSY Right 02/17/2021   Procedure: BREAST LUMPECTOMY,RADIO FREQ LOCALIZER,AXILLARY SENTINEL LYMPH NODE BIOPSY;  Surgeon: Ronny Bacon, MD;  Location: [facility];  Service: General;  Laterality: Right;  . CARDIOVERSION N/A 09/07/2016   Procedure: CARDIOVERSION;  Surgeon: Minna Merritts, MD;  Location: [facility];  Service: Cardiovascular;  Laterality: N/A;  . CARDIOVERSION N/A 10/19/2016   Procedure: CARDIOVERSION;  Surgeon: Minna Merritts, MD;  Location: [facility];  Service: Cardiovascular;  Laterality: N/A;  . CARDIOVERSION N/A 12/14/2016   Procedure:  CARDIOVERSION;  Surgeon: Minna Merritts, MD;  Location: [facility];  Service: Cardiovascular;  Laterality: N/A;  . COLONOSCOPY    . COLONOSCOPY WITH PROPOFOL N/A 05/13/2020   Procedure: COLONOSCOPY WITH PROPOFOL;  Surgeon: Toledo, Benay Pike, MD;  Location: [facility];  Service: Gastroenterology;  Laterality: N/A;  ELIQUIS  . CYSTOSCOPY WITH STENT PLACEMENT Bilateral 07/12/2021   Procedure: CYSTOSCOPY WITH STENT PLACEMENT;  Surgeon: Billey Co, MD;  Location: [facility];  Service: Urology;  Laterality: Bilateral;  . CYSTOSCOPY/URETEROSCOPY/HOLMIUM LASER/STENT PLACEMENT Bilateral 07/30/2021   Procedure: CYSTOSCOPY/URETEROSCOPY/HOLMIUM LASER/BILATERAL STENT REMOVAL;  Surgeon: Billey Co, MD;  Location: [facility];  Service: Urology;  Laterality: Bilateral;  . ESOPHAGOGASTRODUODENOSCOPY    . ESOPHAGOGASTRODUODENOSCOPY (EGD) WITH PROPOFOL N/A 05/13/2020   Procedure: ESOPHAGOGASTRODUODENOSCOPY (EGD) WITH PROPOFOL;  Surgeon: Toledo, Benay Pike, MD;  Location: [facility];  Service: Gastroenterology;  Laterality: N/A;  . FOOT SURGERY Left    tendon  . JOINT REPLACEMENT Right    hip  . PORTA CATH INSERTION N/A 03/23/2021   Procedure: PORTA CATH INSERTION;  Surgeon: Katha Cabal, MD;  Location: [place] [facility];  Service: Cardiovascular;  Laterality: N/A;  . PORTACATH PLACEMENT N/A 03/23/2021   Procedure: ATTEMPTED INSERTION PORT-A-CATH;  Surgeon: Ronny Bacon, MD;  Location: [facility];  Service: General;  Laterality: N/A;  . THYROIDECTOMY     thyroid lobectomy secondary to a benign nodule  . TOTAL HIP ARTHROPLASTY Right 08/21/2018   Procedure: TOTAL HIP ARTHROPLASTY ANTERIOR APPROACH-RIGHT CELL SAVER REQUESTED;  Surgeon: [name], MD;  Location: [facility];  Service: Orthopedics;  Laterality: Right;   Family History  Problem Relation Age of Onset  . Cancer Sister 76       ovarian  . Depression Sister   . Cancer Brother        CLL  . Cancer Father 2       colon  .  Heart disease Father   . Heart attack Father 24       died from MI  . Hyperlipidemia Father   . Hypertension Father   . Cancer Mother        leukemia  . Breast cancer Paternal Aunt        60's  . Migraines Neg Hx     Allergies: Prednisone, Amiodarone, Hydrocodone-acetaminophen, Oxycodone, Tapentadol, Tramadol, Adhesive [tape], and Latex Current Outpatient Medications on File Prior to Visit  Medication Sig Dispense Refill  . acetaminophen (TYLENOL) 500 MG tablet Take 500-1,000 mg by mouth every 6 (six) hours as needed for mild pain or moderate pain.    Marland Kitchen apixaban (ELIQUIS) 5 MG TABS tablet Take 5 mg by mouth 2 (two) times daily.    Marland Kitchen azelastine (ASTELIN) 0.1 % nasal spray Place 1-2 sprays into both nostrils  daily as needed for rhinitis.    Marland Kitchen bisoprolol (ZEBETA) 5 MG tablet Take 5 mg by mouth in the morning and at bedtime.    . busPIRone (BUSPAR) 5 MG tablet TAKE 1 TABLET BY MOUTH THREE TIMES A DAY AS NEEDED 60 tablet 1  . Cholecalciferol (VITAMIN D3) 5000 units CAPS Take 5,000 Units by mouth daily.    . clotrimazole (LOTRIMIN) 1 % cream Apply 1 application topically 2 (two) times daily. 30 g 0  . diphenhydrAMINE (BENADRYL) 25 mg capsule Take 1 capsule (25 mg total) by mouth every 8 (eight) hours as needed for itching or allergies. 30 capsule 0  . esomeprazole (NEXIUM) 20 MG capsule Take 20 mg by mouth daily as needed (GERD symptoms).    . fluconazole (DIFLUCAN) 150 MG tablet Take 1 tablet (150 mg total) by mouth once a week. X 2-4 weeks 4 tablet 0  . furosemide (LASIX) 20 MG tablet TAKE 1 TABLET BY MOUTH TWICE A DAY 180 tablet 0  . gabapentin (NEURONTIN) 100 MG capsule Take 1 capsule (100 mg total) by mouth 3 (three) times daily. 270 capsule 0  . hydrocortisone 2.5 % ointment Apply topically 2 (two) times daily. prn 60 g 2  . KLOR-CON M20 20 MEQ tablet TAKE 1 TABLET BY MOUTH EVERY DAY 90 tablet 1  . lidocaine-prilocaine (EMLA) cream Apply to affected area once 30 g 3  . loperamide  (IMODIUM) 2 MG capsule Take 1 capsule (2 mg total) by mouth See admin instructions. Initial: 4 mg, followed by 2 mg after each loose stool; maximum: 16 mg/day 60 capsule 1  . Multiple Vitamin (MULTIVITAMIN) tablet Take 1 tablet by mouth daily.    Marland Kitchen nystatin (MYCOSTATIN/NYSTOP) powder Apply 1 application. topically 3 (three) times daily. 60 g 11  . nystatin cream (MYCOSTATIN) Apply 1 application. topically 2 (two) times daily. Prn abdomen 60 g 5  . ondansetron (ZOFRAN) 8 MG tablet Take 1 tablet (8 mg total) by mouth every 8 (eight) hours as needed for nausea or vomiting. 60 tablet 0  . prochlorperazine (COMPAZINE) 10 MG tablet Take 1 tablet (10 mg total) by mouth every 6 (six) hours as needed (Nausea or vomiting). 30 tablet 1  . promethazine (PHENERGAN) 25 MG tablet Take 1 tablet (25 mg total) by mouth every 6 (six) hours as needed for nausea or vomiting. 60 tablet 0  . ranolazine (RANEXA) 500 MG 12 hr tablet TAKE 1 TABLET BY MOUTH TWICE A DAY 180 tablet 1  . rizatriptan (MAXALT) 10 MG tablet TAKE 1 TABLET (10 MG TOTAL) BY MOUTH AS NEEDED FOR MIGRAINE. MAY REPEAT IN 2 HOURS IF NEEDED 10 tablet 0  . rOPINIRole (REQUIP) 2 MG tablet Take 1 tablet (2 mg total) by mouth at bedtime. 90 tablet 1  . rosuvastatin (CRESTOR) 20 MG tablet TAKE 1 TABLET BY MOUTH EVERY DAY 90 tablet 0  . silver sulfADIAZINE (SILVADENE) 1 % cream Apply 1 application. topically 2 (two) times daily. 50 g 3   No current facility-administered medications on file prior to visit.    Social History   Tobacco Use  . Smoking status: Former    Packs/day: 0.25    Years: 10.00    Total pack years: 2.50    Types: Cigarettes    Quit date: 2005    Years since quitting: 18.4  . Smokeless tobacco: Never  . Tobacco comments:    smoked for 10 years, 1 pack every 2-3 days  Vaping Use  . Vaping Use: Never  used  Substance Use Topics  . Alcohol use: Not Currently    Alcohol/week: 1.0 standard drink of alcohol    Types: 1 Glasses of wine  per week    Comment: rarely  . Drug use: No    Review of Systems  Constitutional:  Negative for chills and fever.  HENT:  Negative for congestion.   Eyes:  Negative for visual disturbance.  Respiratory:  Negative for cough.   Cardiovascular:  Negative for chest pain and palpitations.  Gastrointestinal:  Negative for nausea and vomiting.  Musculoskeletal:  Positive for arthralgias (left knee pain).  Neurological:  Positive for numbness. Negative for syncope.  Psychiatric/Behavioral:  The patient is nervous/anxious.       Objective:    BP 132/82 (BP Location: Left Arm, Patient Position: Sitting, Cuff Size: Normal)   Pulse 69   Temp 97.7 F (36.5 C) (Oral)   Ht 5' 5"  (1.651 m)   Wt 259 lb (117.5 kg)   SpO2 94%   BMI 43.10 kg/m  BP Readings from Last 3 Encounters:  12/10/21 132/82  12/08/21 (!) 152/78  11/17/21 (!) 150/65   Wt Readings from Last 3 Encounters:  12/10/21 259 lb (117.5 kg)  12/09/21 262 lb (118.8 kg)  12/08/21 262 lb (118.8 kg)    Physical Exam Vitals reviewed.  Constitutional:      Appearance: She is well-developed.  HENT:     Head: Normocephalic and atraumatic.     Right Ear: Hearing, tympanic membrane, ear canal and external ear normal. No swelling or tenderness. No middle ear effusion. Tympanic membrane is not erythematous or bulging.     Left Ear: Tympanic membrane, ear canal and external ear normal. No swelling or tenderness.  No middle ear effusion. Tympanic membrane is not erythematous or bulging.     Nose: Nose normal. No rhinorrhea.     Right Sinus: No maxillary sinus tenderness or frontal sinus tenderness.     Left Sinus: No maxillary sinus tenderness or frontal sinus tenderness.     Mouth/Throat:     Pharynx: Uvula midline. No posterior oropharyngeal erythema.  Eyes:     General: Lids are normal. Lids are everted, no foreign bodies appreciated.     Conjunctiva/sclera: Conjunctivae normal.     Pupils: Pupils are equal, round, and reactive to  light.     Comments: Normal fundus bilaterally   Cardiovascular:     Rate and Rhythm: Normal rate and regular rhythm.     Pulses: Normal pulses.     Heart sounds: Normal heart sounds.  Pulmonary:     Effort: Pulmonary effort is normal.     Breath sounds: Normal breath sounds. No wheezing, rhonchi or rales.  Lymphadenopathy:     Head:     Right side of head: No submental, submandibular, tonsillar, preauricular, posterior auricular or occipital adenopathy.     Left side of head: No submental, submandibular, tonsillar, preauricular, posterior auricular or occipital adenopathy.     Cervical: No cervical adenopathy.     Right cervical: No superficial, deep or posterior cervical adenopathy.    Left cervical: No superficial, deep or posterior cervical adenopathy.  Skin:    General: Skin is warm and dry.  Neurological:     Mental Status: She is alert.     Cranial Nerves: No cranial nerve deficit.     Sensory: No sensory deficit.     Deep Tendon Reflexes:     Reflex Scores:      Bicep reflexes are 2+  on the right side and 2+ on the left side.      Patellar reflexes are 2+ on the right side and 2+ on the left side.    Comments: Grip equal and strong bilateral upper extremities. Gait strong and steady. Able to perform  finger-to-nose without difficulty.   Psychiatric:        Speech: Speech normal.        Behavior: Behavior normal.        Thought Content: Thought content normal.       Assessment & Plan:   Problem List Items Addressed This Visit       Cardiovascular and Mediastinum   Hypertension    Blood pressure well controlled today.  Patient is compliant with bisoprolol 5 mg twice daily, Ranexa, lasix as managed by cardiology which  certainly can contribute to dizziness. She was not particularly orthostatic on exam today and presentation is more consistent with vertigo.  We will continue to monitor blood pressure and discuss with cardiology if any changes to regimen should be  warranted        Other   Anxiety and depression - Primary    Uncontrolled.  Exacerbated by concern over her health and her husband's health.  Cymbalta has not been particularly helpful;  we opted to wean off.   Advised her to increase BuSpar 38m from twice daily to 3 times daily.  Once she is weaned off of Cymbalta, she will start Prozac 20 mg.  I have also increased Xanax 0.233m to allow her to take BID prn temporarily. Close follow up.       Relevant Medications   ALPRAZolam (XANAX) 0.25 MG tablet   FLUoxetine (PROZAC) 20 MG capsule   Dizziness   Vertigo    She is not orthostatic based on vitals. Reassuring neurologic exam.  Presentation most consistent with vertigo.  I opted not perform Dix-Hallpike maneuver today as she is felt vertigo when she changed from lying to sitting while obtaining orthostatic vitals.  Trial of meclizine 12.17m27m CT head obtained 04/05/2021 without acute findings.  We will discuss repeat neuroimaging if symptom continues.  We will also discuss consult with ENT.  close follow up      Relevant Medications   meclizine (ANTIVERT) 12.5 MG tablet   Other Visit Diagnoses     Anxiety disorder, unspecified       Relevant Medications   ALPRAZolam (XANAX) 0.25 MG tablet   FLUoxetine (PROZAC) 20 MG capsule        I have discontinued JacDocia Furlackie"'s DULoxetine. I have also changed her ALPRAZolam. Additionally, I am having her start on FLUoxetine and meclizine. Lastly, I am having her maintain her multivitamin, Vitamin D3, rizatriptan, acetaminophen, esomeprazole, azelastine, lidocaine-prilocaine, prochlorperazine, ondansetron, promethazine, loperamide, rOPINIRole, diphenhydrAMINE, clotrimazole, apixaban, bisoprolol, furosemide, silver sulfADIAZINE, rosuvastatin, fluconazole, nystatin cream, nystatin, hydrocortisone, ranolazine, [place]-Con M20, gabapentin, and busPIRone.   Meds ordered this encounter  Medications  . ALPRAZolam (XANAX) 0.25 MG tablet     Sig: Take 1 tablet (0.25 mg total) by mouth 2 (two) times daily as needed for anxiety.    Dispense:  60 tablet    Refill:  2    This request is for a new prescription for a controlled substance as required by Federal/State law.    Order Specific Question:   Supervising Provider    Answer:   TULCrecencio [id number]]  . FLUoxetine (PROZAC) 20 MG capsule    Sig: Take 1 capsule (20 mg total)  by mouth every morning.    Dispense:  90 capsule    Refill:  3    Order Specific Question:   Supervising Provider    Answer:   Deborra Medina L [[id number]]  . meclizine (ANTIVERT) 12.5 MG tablet    Sig: Take 1 tablet (12.5 mg total) by mouth 3 (three) times daily as needed for dizziness.    Dispense:  30 tablet    Refill:  0    Order Specific Question:   Supervising Provider    Answer:   Crecencio Mc [[id number]]    Return precautions given.   Risks, benefits, and alternatives of the medications and treatment plan prescribed today were discussed, and patient expressed understanding.   Education regarding symptom management and diagnosis given to patient on AVS.  Continue to follow with [name], FNP for routine health maintenance.   [name] and I agreed with plan.   [name], FNP

## 2021-12-13 ENCOUNTER — Telehealth: Payer: Self-pay

## 2021-12-13 ENCOUNTER — Other Ambulatory Visit: Payer: Self-pay

## 2021-12-13 NOTE — Telephone Encounter (Signed)
Lady from insurance carrier called to verify dose of Kanjinti given to patient on dates of: 03/24/2021 ('4mg'$ ), 04/01/2021 ('2mg'$ ), 05/10/2021 ('4mg'$ ), and 05/18/2021 (2g). Insurance verbalized understanding.

## 2021-12-15 ENCOUNTER — Other Ambulatory Visit: Payer: Self-pay | Admitting: Internal Medicine

## 2021-12-15 DIAGNOSIS — L304 Erythema intertrigo: Secondary | ICD-10-CM

## 2021-12-17 NOTE — Assessment & Plan Note (Addendum)
Blood pressure well controlled today.  Patient is compliant with bisoprolol 5 mg twice daily, Ranexa, lasix as managed by cardiology which  certainly can contribute to dizziness. She was not particularly orthostatic on exam today and presentation is more consistent with vertigo.  We will continue to monitor blood pressure and discuss with cardiology if any changes to regimen should be warranted

## 2021-12-17 NOTE — Assessment & Plan Note (Addendum)
Uncontrolled.  Exacerbated by concern over her health and her husband's health.  Cymbalta has not been particularly helpful;  we opted to wean off.   Advised her to increase BuSpar '5mg'$  from twice daily to 3 times daily.  Once she is weaned off of Cymbalta, she will start Prozac 20 mg.  I have also increased Xanax 0.'25mg'$   to allow her to take BID prn temporarily. Close follow up.

## 2021-12-18 ENCOUNTER — Other Ambulatory Visit: Payer: Self-pay | Admitting: Family

## 2021-12-18 DIAGNOSIS — G2581 Restless legs syndrome: Secondary | ICD-10-CM

## 2021-12-28 ENCOUNTER — Ambulatory Visit (INDEPENDENT_AMBULATORY_CARE_PROVIDER_SITE_OTHER): Payer: Medicare Other | Admitting: Family Medicine

## 2021-12-28 ENCOUNTER — Encounter: Payer: Self-pay | Admitting: Family Medicine

## 2021-12-28 DIAGNOSIS — M7989 Other specified soft tissue disorders: Secondary | ICD-10-CM

## 2021-12-28 NOTE — Patient Instructions (Signed)
Nice to meet you. We will get an MRI ordered for you.  If you do not hear about this getting scheduled in the next week or so please let us know.

## 2021-12-29 ENCOUNTER — Inpatient Hospital Stay (HOSPITAL_BASED_OUTPATIENT_CLINIC_OR_DEPARTMENT_OTHER): Payer: Medicare Other | Admitting: Oncology

## 2021-12-29 ENCOUNTER — Inpatient Hospital Stay: Payer: Medicare Other | Attending: Oncology

## 2021-12-29 ENCOUNTER — Telehealth: Payer: Self-pay | Admitting: Family Medicine

## 2021-12-29 ENCOUNTER — Inpatient Hospital Stay: Payer: Medicare Other

## 2021-12-29 ENCOUNTER — Encounter: Payer: Self-pay | Admitting: Oncology

## 2021-12-29 VITALS — BP 139/79 | HR 58 | Temp 96.4°F | Wt 263.0 lb

## 2021-12-29 DIAGNOSIS — Z170 Estrogen receptor positive status [ER+]: Secondary | ICD-10-CM | POA: Diagnosis not present

## 2021-12-29 DIAGNOSIS — C50411 Malignant neoplasm of upper-outer quadrant of right female breast: Secondary | ICD-10-CM | POA: Insufficient documentation

## 2021-12-29 DIAGNOSIS — C50919 Malignant neoplasm of unspecified site of unspecified female breast: Secondary | ICD-10-CM | POA: Diagnosis not present

## 2021-12-29 DIAGNOSIS — M5412 Radiculopathy, cervical region: Secondary | ICD-10-CM | POA: Diagnosis not present

## 2021-12-29 DIAGNOSIS — G629 Polyneuropathy, unspecified: Secondary | ICD-10-CM | POA: Insufficient documentation

## 2021-12-29 DIAGNOSIS — M79652 Pain in left thigh: Secondary | ICD-10-CM | POA: Diagnosis not present

## 2021-12-29 DIAGNOSIS — Z90710 Acquired absence of both cervix and uterus: Secondary | ICD-10-CM | POA: Insufficient documentation

## 2021-12-29 DIAGNOSIS — Z87891 Personal history of nicotine dependence: Secondary | ICD-10-CM | POA: Diagnosis not present

## 2021-12-29 DIAGNOSIS — R229 Localized swelling, mass and lump, unspecified: Secondary | ICD-10-CM

## 2021-12-29 DIAGNOSIS — Z5112 Encounter for antineoplastic immunotherapy: Secondary | ICD-10-CM | POA: Diagnosis not present

## 2021-12-29 DIAGNOSIS — Z7901 Long term (current) use of anticoagulants: Secondary | ICD-10-CM | POA: Diagnosis not present

## 2021-12-29 DIAGNOSIS — R195 Other fecal abnormalities: Secondary | ICD-10-CM | POA: Insufficient documentation

## 2021-12-29 DIAGNOSIS — Z79899 Other long term (current) drug therapy: Secondary | ICD-10-CM | POA: Insufficient documentation

## 2021-12-29 LAB — CBC WITH DIFFERENTIAL/PLATELET
Abs Immature Granulocytes: 0 10*3/uL (ref 0.00–0.07)
Basophils Absolute: 0 10*3/uL (ref 0.0–0.1)
Basophils Relative: 1 %
Eosinophils Absolute: 0.1 10*3/uL (ref 0.0–0.5)
Eosinophils Relative: 1 %
HCT: 36.1 % (ref 36.0–46.0)
Hemoglobin: 12.1 g/dL (ref 12.0–15.0)
Immature Granulocytes: 0 %
Lymphocytes Relative: 33 %
Lymphs Abs: 1.3 10*3/uL (ref 0.7–4.0)
MCH: 31.3 pg (ref 26.0–34.0)
MCHC: 33.5 g/dL (ref 30.0–36.0)
MCV: 93.3 fL (ref 80.0–100.0)
Monocytes Absolute: 0.3 10*3/uL (ref 0.1–1.0)
Monocytes Relative: 7 %
Neutro Abs: 2.3 10*3/uL (ref 1.7–7.7)
Neutrophils Relative %: 58 %
Platelets: 163 10*3/uL (ref 150–400)
RBC: 3.87 MIL/uL (ref 3.87–5.11)
RDW: 14.2 % (ref 11.5–15.5)
WBC: 3.9 10*3/uL — ABNORMAL LOW (ref 4.0–10.5)
nRBC: 0 % (ref 0.0–0.2)

## 2021-12-29 LAB — COMPREHENSIVE METABOLIC PANEL
ALT: 17 U/L (ref 0–44)
AST: 22 U/L (ref 15–41)
Albumin: 3.7 g/dL (ref 3.5–5.0)
Alkaline Phosphatase: 73 U/L (ref 38–126)
Anion gap: 8 (ref 5–15)
BUN: 14 mg/dL (ref 8–23)
CO2: 27 mmol/L (ref 22–32)
Calcium: 8.4 mg/dL — ABNORMAL LOW (ref 8.9–10.3)
Chloride: 104 mmol/L (ref 98–111)
Creatinine, Ser: 0.75 mg/dL (ref 0.44–1.00)
GFR, Estimated: >60 mL/min (ref >60)
Glucose, Bld: 108 mg/dL — ABNORMAL HIGH (ref 70–99)
Potassium: 3.8 mmol/L (ref 3.5–5.1)
Sodium: 139 mmol/L (ref 135–145)
Total Bilirubin: 0.5 mg/dL (ref 0.3–1.2)
Total Protein: 6.6 g/dL (ref 6.5–8.1)

## 2021-12-29 MED ORDER — SODIUM CHLORIDE 0.9% FLUSH
10.0000 mL | INTRAVENOUS | Status: DC | PRN
  Administered 2021-12-29: 10 mL
  Filled 2021-12-29: qty 10

## 2021-12-29 MED ORDER — DIPHENHYDRAMINE HCL 25 MG PO CAPS
50.0000 mg | ORAL_CAPSULE | Freq: Once | ORAL | Status: AC
  Administered 2021-12-29: 50 mg via ORAL
  Filled 2021-12-29: qty 2

## 2021-12-29 MED ORDER — HEPARIN SOD (PORK) LOCK FLUSH 100 UNIT/ML IV SOLN
INTRAVENOUS | Status: AC
  Administered 2021-12-29: 500 [IU]
  Filled 2021-12-29: qty 5

## 2021-12-29 MED ORDER — SODIUM CHLORIDE 0.9% FLUSH
10.0000 mL | Freq: Once | INTRAVENOUS | Status: AC
  Administered 2021-12-29: 10 mL via INTRAVENOUS
  Filled 2021-12-29: qty 10

## 2021-12-29 MED ORDER — TRASTUZUMAB-ANNS CHEMO 150 MG IV SOLR
6.0000 mg/kg | Freq: Once | INTRAVENOUS | Status: AC
  Administered 2021-12-29: 777 mg via INTRAVENOUS
  Filled 2021-12-29: qty 37

## 2021-12-29 MED ORDER — HEPARIN SOD (PORK) LOCK FLUSH 100 UNIT/ML IV SOLN
500.0000 [IU] | Freq: Once | INTRAVENOUS | Status: AC | PRN
  Filled 2021-12-29: qty 5

## 2021-12-29 MED ORDER — SODIUM CHLORIDE 0.9 % IV SOLN
Freq: Once | INTRAVENOUS | Status: AC
  Filled 2021-12-29: qty 250

## 2021-12-29 MED ORDER — ACETAMINOPHEN 325 MG PO TABS
650.0000 mg | ORAL_TABLET | Freq: Once | ORAL | Status: AC
  Administered 2021-12-29: 650 mg via ORAL
  Filled 2021-12-29: qty 2

## 2021-12-29 NOTE — Patient Instructions (Addendum)
[facility]  Discharge Instructions: Thank you for choosing [facility] to provide your oncology and hematology care.  If you have a lab appointment with the Cancer Center, please go directly to the Cancer Center and check in at the registration area.  Wear comfortable clothing and clothing appropriate for easy access to any Portacath or PICC line.   We strive to give you quality time with your provider. You may need to reschedule your appointment if you arrive late (15 or more minutes).  Arriving late affects you and other patients whose appointments are after yours.  Also, if you miss three or more appointments without notifying the office, you may be dismissed from the clinic at the provider's discretion.      For prescription refill requests, have your pharmacy contact our office and allow 72 hours for refills to be completed.    Today you received the following chemotherapy and/or immunotherapy agents- trastuzumab      To help prevent nausea and vomiting after your treatment, we encourage you to take your nausea medication as directed.  BELOW ARE SYMPTOMS THAT SHOULD BE REPORTED IMMEDIATELY: *FEVER GREATER THAN 100.4 F (38 C) OR HIGHER *CHILLS OR SWEATING *NAUSEA AND VOMITING THAT IS NOT CONTROLLED WITH YOUR NAUSEA MEDICATION *UNUSUAL SHORTNESS OF BREATH *UNUSUAL BRUISING OR BLEEDING *URINARY PROBLEMS (pain or burning when urinating, or frequent urination) *BOWEL PROBLEMS (unusual diarrhea, constipation, pain near the anus) TENDERNESS IN MOUTH AND THROAT WITH OR WITHOUT PRESENCE OF ULCERS (sore throat, sores in mouth, or a toothache) UNUSUAL RASH, SWELLING OR PAIN  UNUSUAL VAGINAL DISCHARGE OR ITCHING   Items with * indicate a potential emergency and should be followed up as soon as possible or go to the Emergency Department if any problems should occur.  Please show the CHEMOTHERAPY ALERT CARD or IMMUNOTHERAPY ALERT CARD at check-in  to the Emergency Department and triage nurse.  Should you have questions after your visit or need to cancel or reschedule your appointment, please contact [facility]  [phone] and follow the prompts.  Office hours are 8:00 a.m. to 4:30 p.m. Monday - Friday. Please note that voicemails left after 4:00 p.m. may not be returned until the following business day.  We are closed weekends and major holidays. You have access to a nurse at all times for urgent questions. Please call the main number to the clinic [phone] and follow the prompts.  For any non-urgent questions, you may also contact your provider using MyChart. We now offer e-Visits for anyone 18 and older to request care online for non-urgent symptoms. For details visit [email or web address].   Also download the MyChart app! Go to the app store, search "MyChart", open the app, select [place], and log in with your MyChart username and password.  Masks are optional in the cancer centers. If you would like for your care team to wear a mask while they are taking care of you, please let them know. For doctor visits, patients may have with them one support person who is at least 69 years old. At this time, visitors are not allowed in the infusion area.   

## 2021-12-29 NOTE — Progress Notes (Signed)
Hematology/Oncology Progress note [phone] [phone]      Patient Care Team: [name], FNP as PCP - General (Family Medicine) [name] [name], MD as PCP - Cardiology (Cardiology) Minna Merritts, MD as Consulting Physician (Cardiology) Rico Junker, RN as Oncology Nurse Navigator [name], MD as Consulting Physician (Hematology and Oncology) Billey Co, MD as Consulting Physician (Urology) Noreene Filbert, MD as Consulting Physician (Radiation Oncology) Ronny Bacon, MD as Consulting Physician (General Surgery)  REFERRING PROVIDER: [name], FNP  CHIEF COMPLAINTS/REASON FOR VISIT:  Follow-up for stage I HER2 positive breast cancer  HISTORY OF PRESENTING ILLNESS:   Amber Barnes is a  71 y.o.  female with PMH listed below was seen in consultation at the request of  [name], FNP  for evaluation of weight loss, family history ovarian cancer. Patient reports a history of nausea vomiting, early satiety, dysphagia symptoms that started in March.  She has lost significant amount of weight as well. Patient weighed 340 pounds in February 2021, 309 pounds in May 2021 and currently she weighs 296 pounds today. Patient has tried Zofran and scopolamine patch which did not relieve her symptoms. 11/05/2019, CT abdomen pelvis showed chronic changes with out acute abnormality to correspond with the patient's symptoms. Patient has had right upper quadrant ultrasound done on 11/14/2019, which showed hepatic steatosis with no other acute abnormality. Patient was seen by gastroenterology Dr. [name] on 11/18/2019.  Patient was recommended to take PPI daily.  Added Carafate.  H. pylori testing came back positive and the patient was started on a course of tetracycline, metronidazole, Pepto-Bismol for H. pylori treatments.  Patient reports that she has been on the treatment for 7 days now.  Nausea is slightly better but still persistent.   She is following up with gastroenterology next week for discussion of EGD. Currently she is not able to tolerate colonoscopy prep due to the nausea.  Her stool was tested positive for occult blood. She also reports night sweating which is a chronic symptom for her. Patient was last seen on 05/20/2020 for evaluation of weight loss, family history of ovarian cancer patient was referred to establish care with genetic counselor.  Patient eventually declined genetic testing.  12/30/2020, patient had a screening mammogram done which showed possible mass and asymmetry in the right breast. 01/08/2021, diagnostic mammogram of the right breast showed 2 adjacent irregular spiculated masses.  Larger 1 was 10 mm and a smaller mass measured 5 mm. 01/15/2021 stereotactic biopsy of the right breast smaller mass showed invasive carcinoma no special type, grade 2, DCIS present, LVI negative. Ultrasound guided biopsy of the larger mass showed invasive mammary carcinoma,   Patient has appointment with surgery Dr. [name] later this week.  She was referred to establish care with oncology for evaluation.  She was accompanied by daughter. She has no new complaints.  Family history of breast cancer: Denies Family history of other cancers: Patient has a strong family history of leukemia in her mother, colon cancer in her father at age of 30, ovarian cancer in her sister at age of 8, CLL in her brother  02/17/2021, patient is status post lumpectomy and sentinel lymph node biopsy Pathology showed invasive mammary carcinoma, 2 separate foci, DCIS, negative sentinel lymph node biopsy 0/1, grade 3, or margins negative for invasive carcinoma.  ER -, PR + [1-10%], HER2 +  pT1b(m) pN0   # Strong family history of cancer, Discussed again about genetic testing. patient adamantly  declined.  #  Intermittent dizziness which per patient is chronic for her due to the vertigo. #03/23/2021 Mediport by vascular surgeon #History of  CHF, 03/08/2021, echocardiogram showed LVEF 60-65%. Per patient, she has been seen by Dr. [name] team and was cleared for proceeding with chemotherapy.   03/24/2021 - 03/31/2021 Taxol 80 mg/m2 [name] 04/05/2021 - 04/09/2021 patient was admitted due to neutropenic fever with associated SIRS.  Patient was treated with empiric cefepime and vancomycin until date of discharge when [place] recovered to 1.4.  Post hospitalization, patient also had COVID-19 infection so her follow-up appointment was further postponed for another 2 weeks.  05/10/2021 resumed dose reduce Taxol 39m/m2 /[name]11/15/2022-06/30/2021, cycle 2 dose reduced Taxol 679mm2 /trastuzumab  admitted from 07/12/2021 - 07/17/2021 due to acute pyelonephritis UVJ obstruction and a right hydro ureteralnephrosis.  Patient was treated with IV antibiotics status post emergent bilateral ureteral stent placement.  Discharged home with a course of oral antibiotics.  08/18/2021 - 09/08/2021, status post adjuvant radiation.  Patient had experienced skin toxicities from radiation.  Symptoms are better since the completion of radiation.  10/06/2021, shared decision was made to proceed with maintenance trastuzumab.  Her cardiologist Dr. GoRockey Situave clearance to proceed.  INTERVAL HISTORY [name] a [age].o. female who has above history reviewed by me today presents for follow up visit for management of right side HER 2 positive breast cancer Patient is currently on maintenance trastuzumab. No SOB, chest pain, nausea vomiting. No  Left butt/thigh tenderness for a few weeks, she was seen by Dr.Sonnenberg who feels it is likely a lipoma, and a ultrasound soft tissue left lower extremity was ordered for further evaluation. No prior injury.  Review of Systems  Constitutional:  Positive for fatigue. Negative for appetite change, chills, fever and unexpected weight change.       Patient sits in the wheelchair  HENT:   Negative for hearing loss  and voice change.   Respiratory:  Negative for chest tightness and cough.   Cardiovascular:  Positive for leg swelling. Negative for chest pain.  Gastrointestinal:  Negative for abdominal distention, abdominal pain, blood in stool, nausea and vomiting.  Endocrine: Negative for hot flashes.  Genitourinary:  Negative for difficulty urinating, frequency and hematuria.   Musculoskeletal:  Negative for arthralgias.       Left butt/thigh area of tenderness, increasing in size.  Skin:  Negative for itching and rash.  Neurological:  Positive for numbness. Negative for dizziness and extremity weakness.  Hematological:  Negative for adenopathy.  Psychiatric/Behavioral:  Negative for confusion.     MEDICAL HISTORY:  Past Medical History:  Diagnosis Date   Achilles tendinitis    Acute medial meniscal tear    Allergy    Anxiety    Atrial fibrillation ([facility] [place]   a. new onset 07/2016; b. CHADS2VASc --> 4 (HTN, female, age, CHF); c. eliquis   CHF (congestive heart failure) (HCC)    Chronic anticoagulation    Apixaban   Chronic lower back pain    Colon polyp    Complication of anesthesia    Emergence delirium; hypoxia   COPD (chronic obstructive pulmonary disease) ([facility] [place]   Cystic breast    Depression    Endometriosis    Fibrocystic breast disease    GERD (gastroesophageal reflux disease)    History of stress test    a. 07/2016 MV: EF 55-65%. Small, mild defect  in mid anteroseptal, apical septal, and apical locations. No ischemia-->low risk.   Hyperlipidemia  Hypertension    Insomnia    Lipoma    Malignant neoplasm of upper-outer quadrant of right female breast (HCC)    Stage 1A invasive mammary carcinoma (cT1b or T1c N0); ER (-), PR (+), HER2/neu (+)   Migraine    Mild Mitral regurgitation    a. 07/2016 Echo: EF 55-65%, no rwma, mild MR. Mildly dil LA. Nl RV fx.    Nephrolithiasis    OSA (obstructive sleep apnea)    non-compliant with nocturnal PAP therapy   Osteoarthritis    left  knee   Osteopenia    Peptic ulcer disease    a. H. pylori   Pneumonia    RLS (restless legs syndrome)    Tendonitis    Thyroid disease    Vitamin D deficiency     SURGICAL HISTORY: Past Surgical History:  Procedure Laterality Date   ABDOMINAL HYSTERECTOMY     ovaries left   ACHILLES TENDON SURGERY     2012,01/2017   BACK SURGERY     fusion lumbar   BREAST BIOPSY Right 01/15/2021   Affirm biopsy-"X" clip-INVASIVE MAMMARY CARCINOMA   BREAST BIOPSY Right 01/15/2021   u/s bx-"venus" clip INVASIVE MAMMARY CARCINOMA   BREAST BIOPSY     BREAST LUMPECTOMY,RADIO FREQ LOCALIZER,AXILLARY SENTINEL LYMPH NODE BIOPSY Right 02/17/2021   Procedure: BREAST LUMPECTOMY,RADIO FREQ LOCALIZER,AXILLARY SENTINEL LYMPH NODE BIOPSY;  Surgeon: Ronny Bacon, MD;  Location: [facility];  Service: General;  Laterality: Right;   CARDIOVERSION N/A 09/07/2016   Procedure: CARDIOVERSION;  Surgeon: Minna Merritts, MD;  Location: [facility];  Service: Cardiovascular;  Laterality: N/A;   CARDIOVERSION N/A 10/19/2016   Procedure: CARDIOVERSION;  Surgeon: Minna Merritts, MD;  Location: [facility];  Service: Cardiovascular;  Laterality: N/A;   CARDIOVERSION N/A 12/14/2016   Procedure: CARDIOVERSION;  Surgeon: Minna Merritts, MD;  Location: [facility];  Service: Cardiovascular;  Laterality: N/A;   COLONOSCOPY     COLONOSCOPY WITH PROPOFOL N/A 05/13/2020   Procedure: COLONOSCOPY WITH PROPOFOL;  Surgeon: Toledo, Benay Pike, MD;  Location: [facility];  Service: Gastroenterology;  Laterality: N/A;  ELIQUIS   CYSTOSCOPY WITH STENT PLACEMENT Bilateral 07/12/2021   Procedure: CYSTOSCOPY WITH STENT PLACEMENT;  Surgeon: Billey Co, MD;  Location: [facility];  Service: Urology;  Laterality: Bilateral;   CYSTOSCOPY/URETEROSCOPY/HOLMIUM LASER/STENT PLACEMENT Bilateral 07/30/2021   Procedure: CYSTOSCOPY/URETEROSCOPY/HOLMIUM LASER/BILATERAL STENT REMOVAL;  Surgeon: Billey Co, MD;  Location: [facility];  Service: Urology;   Laterality: Bilateral;   ESOPHAGOGASTRODUODENOSCOPY     ESOPHAGOGASTRODUODENOSCOPY (EGD) WITH PROPOFOL N/A 05/13/2020   Procedure: ESOPHAGOGASTRODUODENOSCOPY (EGD) WITH PROPOFOL;  Surgeon: Toledo, Benay Pike, MD;  Location: [facility];  Service: Gastroenterology;  Laterality: N/A;   FOOT SURGERY Left    tendon   JOINT REPLACEMENT Right    hip   PORTA CATH INSERTION N/A 03/23/2021   Procedure: PORTA CATH INSERTION;  Surgeon: Katha Cabal, MD;  Location: [facility];  Service: Cardiovascular;  Laterality: N/A;   PORTACATH PLACEMENT N/A 03/23/2021   Procedure: ATTEMPTED INSERTION PORT-A-CATH;  Surgeon: Ronny Bacon, MD;  Location: [facility];  Service: General;  Laterality: N/A;   THYROIDECTOMY     thyroid lobectomy secondary to a benign nodule   TOTAL HIP ARTHROPLASTY Right 08/21/2018   Procedure: TOTAL HIP ARTHROPLASTY ANTERIOR APPROACH-RIGHT CELL SAVER REQUESTED;  Surgeon: [name], MD;  Location: [facility];  Service: Orthopedics;  Laterality: Right;    SOCIAL HISTORY: Social History   Socioeconomic History   Marital status: Married  Spouse name: jeffrey   Number of children: 1   Years of education: Not on file   Highest education level: High school graduate  Occupational History    Comment: disabled  Tobacco Use   Smoking status: Former    Packs/day: 0.25    Years: 10.00    Total pack years: 2.50    Types: Cigarettes    Quit date: 2005    Years since quitting: 18.4   Smokeless tobacco: Never   Tobacco comments:    smoked for 10 years, 1 pack every 2-3 days  Vaping Use   Vaping Use: Never used  Substance and Sexual Activity   Alcohol use: Not Currently    Alcohol/week: 1.0 standard drink of alcohol    Types: 1 Glasses of wine per week    Comment: rarely   Drug use: No   Sexual activity: Not Currently  Other Topics Concern   Not on file  Social History Narrative   Moved to [place] from [place], originally from [place].     1 cup of caffeine daily   Right handed   Lives with husband, step-daughter and child       Social Determinants of Health   Financial Resource Strain: Low Risk  (12/09/2021)   Overall Financial Resource Strain (CARDIA)    Difficulty of Paying Living Expenses: Not very hard  Food Insecurity: No Food Insecurity (12/09/2021)   Hunger Vital Sign    Worried About Running Out of Food in the Last Year: Never true    Ran Out of Food in the Last Year: Never true  Transportation Needs: No Transportation Needs (12/09/2021)   PRAPARE - Hydrologist (Medical): No    Lack of Transportation (Non-Medical): No  Physical Activity: Insufficiently Active (12/09/2021)   Exercise Vital Sign    Days of Exercise per Week: 7 days    Minutes of Exercise per Session: 20 min  Stress: Stress Concern Present (12/09/2021)   [place]    Feeling of Stress : Very much  Social Connections: Moderately Isolated (12/09/2021)   Social Connection and Isolation Panel [NHANES]    Frequency of Communication with Friends and Family: Never    Frequency of Social Gatherings with Friends and Family: Once a week    Attends Religious Services: More than 4 times per year    Active Member of [employer] or Organizations: No    Attends Archivist Meetings: Never    Marital Status: Married  Human resources officer Violence: Not At Risk (12/09/2021)   Humiliation, Afraid, Rape, and Kick questionnaire    Fear of Current or Ex-Partner: No    Emotionally Abused: No    Physically Abused: No    Sexually Abused: No    FAMILY HISTORY: Family History  Problem Relation Age of Onset   Cancer Sister 55       ovarian   Depression Sister    Cancer Brother        CLL   Cancer Father 16       colon   Heart disease Father    Heart attack Father 60       died from MI   Hyperlipidemia Father    Hypertension Father    Cancer Mother        leukemia    Breast cancer Paternal Aunt        6's   Migraines Neg Hx  ALLERGIES:  is allergic to prednisone, amiodarone, hydrocodone-acetaminophen, oxycodone, tapentadol, tramadol, adhesive [tape], and latex.  MEDICATIONS:  Current Outpatient Medications  Medication Sig Dispense Refill   acetaminophen (TYLENOL) 500 MG tablet Take 500-1,000 mg by mouth every 6 (six) hours as needed for mild pain or moderate pain.     ALPRAZolam (XANAX) 0.25 MG tablet Take 1 tablet (0.25 mg total) by mouth 2 (two) times daily as needed for anxiety. 60 tablet 2   apixaban (ELIQUIS) 5 MG TABS tablet Take 5 mg by mouth 2 (two) times daily.     azelastine (ASTELIN) 0.1 % nasal spray Place 1-2 sprays into both nostrils daily as needed for rhinitis.     bisoprolol (ZEBETA) 5 MG tablet Take 5 mg by mouth in the morning and at bedtime.     busPIRone (BUSPAR) 5 MG tablet TAKE 1 TABLET BY MOUTH THREE TIMES A DAY AS NEEDED 60 tablet 1   Cholecalciferol (VITAMIN D3) 5000 units CAPS Take 5,000 Units by mouth daily.     clotrimazole (LOTRIMIN) 1 % cream Apply 1 application topically 2 (two) times daily. 30 g 0   diphenhydrAMINE (BENADRYL) 25 mg capsule Take 1 capsule (25 mg total) by mouth every 8 (eight) hours as needed for itching or allergies. 30 capsule 0   esomeprazole (NEXIUM) 20 MG capsule Take 20 mg by mouth daily as needed (GERD symptoms).     fluconazole (DIFLUCAN) 150 MG tablet Take 1 tablet (150 mg total) by mouth once a week. X 2-4 weeks 4 tablet 0   FLUoxetine (PROZAC) 20 MG capsule Take 1 capsule (20 mg total) by mouth every morning. 90 capsule 3   furosemide (LASIX) 20 MG tablet TAKE 1 TABLET BY MOUTH TWICE A DAY 180 tablet 0   gabapentin (NEURONTIN) 100 MG capsule Take 1 capsule (100 mg total) by mouth 3 (three) times daily. 270 capsule 0   hydrocortisone 2.5 % ointment Apply topically 2 (two) times daily. prn 60 g 2   KLOR-CON M20 20 MEQ tablet TAKE 1 TABLET BY MOUTH EVERY DAY 90 tablet 1   lidocaine-prilocaine  (EMLA) cream Apply to affected area once 30 g 3   loperamide (IMODIUM) 2 MG capsule Take 1 capsule (2 mg total) by mouth See admin instructions. Initial: 4 mg, followed by 2 mg after each loose stool; maximum: 16 mg/day 60 capsule 1   meclizine (ANTIVERT) 12.5 MG tablet Take 1 tablet (12.5 mg total) by mouth 3 (three) times daily as needed for dizziness. 30 tablet 0   Multiple Vitamin (MULTIVITAMIN) tablet Take 1 tablet by mouth daily.     nystatin (MYCOSTATIN/NYSTOP) powder Apply 1 application. topically 3 (three) times daily. 60 g 11   nystatin cream (MYCOSTATIN) Apply 1 application. topically 2 (two) times daily. Prn abdomen 60 g 5   ondansetron (ZOFRAN) 8 MG tablet Take 1 tablet (8 mg total) by mouth every 8 (eight) hours as needed for nausea or vomiting. 60 tablet 0   prochlorperazine (COMPAZINE) 10 MG tablet Take 1 tablet (10 mg total) by mouth every 6 (six) hours as needed (Nausea or vomiting). 30 tablet 1   promethazine (PHENERGAN) 25 MG tablet Take 1 tablet (25 mg total) by mouth every 6 (six) hours as needed for nausea or vomiting. 60 tablet 0   ranolazine (RANEXA) 500 MG 12 hr tablet TAKE 1 TABLET BY MOUTH TWICE A DAY 180 tablet 1   rizatriptan (MAXALT) 10 MG tablet TAKE 1 TABLET (10 MG TOTAL) BY MOUTH AS NEEDED  FOR MIGRAINE. MAY REPEAT IN 2 HOURS IF NEEDED 10 tablet 0   rOPINIRole (REQUIP) 2 MG tablet TAKE 1 TABLET BY MOUTH AT BEDTIME. 90 tablet 1   rosuvastatin (CRESTOR) 20 MG tablet TAKE 1 TABLET BY MOUTH EVERY DAY 90 tablet 0   silver sulfADIAZINE (SILVADENE) 1 % cream Apply 1 application. topically 2 (two) times daily. 50 g 3   No current facility-administered medications for this visit.     PHYSICAL EXAMINATION: ECOG PERFORMANCE STATUS: 2 - Symptomatic, <50% confined to bed Vitals:   12/29/21 1309  BP: 139/79  Pulse: (!) 58  Temp: (!) 96.4 F (35.8 C)    Filed Weights   12/29/21 1309  Weight: 263 lb (119.3 kg)     Physical Exam Constitutional:      General: She  is not in acute distress.    Appearance: She is obese.  HENT:     Head: Normocephalic and atraumatic.  Eyes:     General: No scleral icterus. Cardiovascular:     Rate and Rhythm: Normal rate and regular rhythm.     Heart sounds: Normal heart sounds.  Pulmonary:     Effort: Pulmonary effort is normal. No respiratory distress.     Breath sounds: No wheezing.  Abdominal:     General: Bowel sounds are normal. There is no distension.     Palpations: Abdomen is soft.  Musculoskeletal:        General: No deformity. Normal range of motion.     Cervical back: Normal range of motion and neck supple.     Comments: 1+ edema bilaterally in lower extremity Left outer thing tenderness, no discrete large mass, a few palpable tender subcutaneous nodules.    Skin:    General: Skin is warm and dry.     Findings: No erythema or rash.  Neurological:     Mental Status: She is alert and oriented to person, place, and time. Mental status is at baseline.     Cranial Nerves: No cranial nerve deficit.     Coordination: Coordination normal.  Psychiatric:        Mood and Affect: Mood normal.     . LABORATORY DATA:  I have reviewed the data as listed Lab Results  Component Value Date   WBC 3.9 (L) 12/29/2021   HGB 12.1 12/29/2021   HCT 36.1 12/29/2021   MCV 93.3 12/29/2021   PLT 163 12/29/2021   Recent Labs    07/02/21 1256 07/03/21 0711 11/17/21 0851 12/08/21 1248 12/29/21 1244  NA 137   < > 135 141 139  K 3.7   < > 3.6 3.7 3.8  CL 108   < > 102 104 104  CO2 24   < > _0 GLUCOSE 104*   < > 95 124* 108*  BUN 19   < > _1 CREATININE 0.77   < > 0.85 0.79 0.75  CALCIUM 8.7*   < > 8.4* 8.7* 8.4*  GFRNONAA >60   < > >60 >60 >60  PROT 6.8   < > 6.9 6.7 6.6  ALBUMIN 3.8   < > 3.7 3.6 3.7  AST 20   < > _2 ALT 15   < > _3 ALKPHOS 59   < > 74 71 73  BILITOT 0.8   < > 0.7 0.6 0.5  BILIDIR 0.1  --   --   --   --   IBILI 0.7  --   --   --   --    < > =  values in this  interval not displayed.    Iron/TIBC/Ferritin/ %Sat    Component Value Date/Time   IRON 72 03/31/2021 0856   TIBC 364 03/31/2021 0856   FERRITIN 291 03/31/2021 0856   IRONPCTSAT 20 03/31/2021 0856      RADIOGRAPHIC STUDIES: I have personally reviewed the radiological images as listed and agreed with the findings in the report. No results found.    ASSESSMENT & PLAN:  1. HER2-positive carcinoma of breast ([place])   2. Encounter for monoclonal antibody treatment for malignancy   3. Neuropathy   4. Acute thigh pain, left    Cancer Staging  Malignant neoplasm of upper-outer quadrant of right female breast [facility]) Staging form: Breast, AJCC 8th Edition - Pathologic stage from 03/03/2021: Stage IA (pT1b, pN0, cM0, G3, ER-, PR+, HER2+) - Signed by [name], MD on 03/03/2021  #Right breast invasive carcinoma pT1b pN0 ER negative, PR weakly positive HER 2 positive.  Patient does not tolerate adjuvant chemotherapy with Taxol and trastuzumab despite Taxol dose reduction.-->Status post adjuvant radiation--> maintenance Trastuzumab Labs are reviewed and discussed with patient. Proceed with trastuzumab maintenance  #Pre-existing neuropathy in her fingertips bilaterally- secondary to cervical spine radiculopathy continue 100 mg 3 times daily.  She may increase dose of 200 mg at bedtime.  # left outer thigh tenderness.?  Panniculitis.  Dr.Sonneberg has ordered ultrasound for evaluation. Will review her [place] result at next visit.   Supportive care measures are necessary for patient well-being and will be provided as necessary. We spent sufficient time to discuss many aspect of care, questions were answered to patient's satisfaction.  Follow up in 3 weeks.   All questions were answered. The patient knows to call the clinic with any problems questions or concerns.  cc [name], FNP  Follow-up 3 weeks lab MD trastuzumab. 12/29/2021

## 2021-12-29 NOTE — Telephone Encounter (Signed)
Please let the patient know that in looking for an MRI order for the area of concern over her left hip I could not find a specific order for this area so we will get an ultrasound to look at the lesion to start with.  Also after thinking about it some more I think we should just go ahead and send her to a surgeon to evaluate the area as well given that she has been having pain in the area has been growing.  I can place that referral once you speak with her.

## 2021-12-29 NOTE — Telephone Encounter (Signed)
LVM for patient to call back.   Nyko Gell,cma  

## 2021-12-29 NOTE — Assessment & Plan Note (Addendum)
I discussed that this area is likely a lipoma though given the fact that it has been enlarging and has become painful we will need to do advanced imaging to completely characterize this area.  We will get imaging.  She was advised to contact us if she does not hear about this being scheduled within 1 to 2 weeks. After the visit I determine that she should go ahead and see a surgeon for this. We will relay this to her by phone.

## 2021-12-29 NOTE — Progress Notes (Signed)
Tommi Rumps, MD Phone: [phone]  Amber Barnes is a 71 y.o. female who presents today for same-day visit.  Subcutaneous mass: Patient reports a knot near her left hip.  Notes she thinks it is a lipoma though it has been getting bigger and has had some associated pain.  She wonders if it is just more noticeable given that she has lost 105 pounds over the last year or so.  She reports a history of lipomas scattered over her body.  Social History   Tobacco Use  Smoking Status Former   Packs/day: 0.25   Years: 10.00   Total pack years: 2.50   Types: Cigarettes   Quit date: 2005   Years since quitting: 18.4  Smokeless Tobacco Never  Tobacco Comments   smoked for 10 years, 1 pack every 2-3 days    Current Outpatient Medications on File Prior to Visit  Medication Sig Dispense Refill   acetaminophen (TYLENOL) 500 MG tablet Take 500-1,000 mg by mouth every 6 (six) hours as needed for mild pain or moderate pain.     ALPRAZolam (XANAX) 0.25 MG tablet Take 1 tablet (0.25 mg total) by mouth 2 (two) times daily as needed for anxiety. 60 tablet 2   apixaban (ELIQUIS) 5 MG TABS tablet Take 5 mg by mouth 2 (two) times daily.     azelastine (ASTELIN) 0.1 % nasal spray Place 1-2 sprays into both nostrils daily as needed for rhinitis.     bisoprolol (ZEBETA) 5 MG tablet Take 5 mg by mouth in the morning and at bedtime.     busPIRone (BUSPAR) 5 MG tablet TAKE 1 TABLET BY MOUTH THREE TIMES A DAY AS NEEDED 60 tablet 1   Cholecalciferol (VITAMIN D3) 5000 units CAPS Take 5,000 Units by mouth daily.     clotrimazole (LOTRIMIN) 1 % cream Apply 1 application topically 2 (two) times daily. 30 g 0   diphenhydrAMINE (BENADRYL) 25 mg capsule Take 1 capsule (25 mg total) by mouth every 8 (eight) hours as needed for itching or allergies. 30 capsule 0   esomeprazole (NEXIUM) 20 MG capsule Take 20 mg by mouth daily as needed (GERD symptoms).     fluconazole (DIFLUCAN) 150 MG tablet Take 1 tablet (150 mg  total) by mouth once a week. X 2-4 weeks 4 tablet 0   FLUoxetine (PROZAC) 20 MG capsule Take 1 capsule (20 mg total) by mouth every morning. 90 capsule 3   furosemide (LASIX) 20 MG tablet TAKE 1 TABLET BY MOUTH TWICE A DAY 180 tablet 0   gabapentin (NEURONTIN) 100 MG capsule Take 1 capsule (100 mg total) by mouth 3 (three) times daily. 270 capsule 0   hydrocortisone 2.5 % ointment Apply topically 2 (two) times daily. prn 60 g 2   KLOR-CON M20 20 MEQ tablet TAKE 1 TABLET BY MOUTH EVERY DAY 90 tablet 1   lidocaine-prilocaine (EMLA) cream Apply to affected area once 30 g 3   loperamide (IMODIUM) 2 MG capsule Take 1 capsule (2 mg total) by mouth See admin instructions. Initial: 4 mg, followed by 2 mg after each loose stool; maximum: 16 mg/day 60 capsule 1   meclizine (ANTIVERT) 12.5 MG tablet Take 1 tablet (12.5 mg total) by mouth 3 (three) times daily as needed for dizziness. 30 tablet 0   Multiple Vitamin (MULTIVITAMIN) tablet Take 1 tablet by mouth daily.     nystatin (MYCOSTATIN/NYSTOP) powder Apply 1 application. topically 3 (three) times daily. 60 g 11   nystatin cream (MYCOSTATIN) Apply 1  application. topically 2 (two) times daily. Prn abdomen 60 g 5   ondansetron (ZOFRAN) 8 MG tablet Take 1 tablet (8 mg total) by mouth every 8 (eight) hours as needed for nausea or vomiting. 60 tablet 0   prochlorperazine (COMPAZINE) 10 MG tablet Take 1 tablet (10 mg total) by mouth every 6 (six) hours as needed (Nausea or vomiting). 30 tablet 1   promethazine (PHENERGAN) 25 MG tablet Take 1 tablet (25 mg total) by mouth every 6 (six) hours as needed for nausea or vomiting. 60 tablet 0   ranolazine (RANEXA) 500 MG 12 hr tablet TAKE 1 TABLET BY MOUTH TWICE A DAY 180 tablet 1   rizatriptan (MAXALT) 10 MG tablet TAKE 1 TABLET (10 MG TOTAL) BY MOUTH AS NEEDED FOR MIGRAINE. MAY REPEAT IN 2 HOURS IF NEEDED 10 tablet 0   rOPINIRole (REQUIP) 2 MG tablet TAKE 1 TABLET BY MOUTH AT BEDTIME. 90 tablet 1   rosuvastatin  (CRESTOR) 20 MG tablet TAKE 1 TABLET BY MOUTH EVERY DAY 90 tablet 0   silver sulfADIAZINE (SILVADENE) 1 % cream Apply 1 application. topically 2 (two) times daily. 50 g 3   No current facility-administered medications on file prior to visit.     ROS see history of present illness  Objective  Physical Exam Vitals:   12/28/21 1218  BP: 110/70  Pulse: 71  Temp: 98 F (36.7 C)  SpO2: 96%    BP Readings from Last 3 Encounters:  12/28/21 110/70  12/10/21 132/82  12/08/21 (!) 152/78   Wt Readings from Last 3 Encounters:  12/28/21 262 lb 9.6 oz (119.1 kg)  12/10/21 259 lb (117.5 kg)  12/09/21 262 lb (118.8 kg)    Physical Exam Musculoskeletal:       Legs:      Assessment/Plan: Please see individual problem list.  Problem List Items Addressed This Visit     Soft tissue mass    I discussed that this area is likely a lipoma though given the fact that it has been enlarging and has become painful we will need to do advanced imaging to completely characterize this area.  We will get imaging.  She was advised to contact us if she does not hear about this being scheduled within 1 to 2 weeks. After the visit I determine that she should go ahead and see a surgeon for this. We will relay this to her by phone.       Relevant Orders   [place] LT LOWER EXTREM LTD SOFT TISSUE NON VASCULAR     Return if symptoms worsen or fail to improve.   Tommi Rumps, MD [place]

## 2021-12-30 NOTE — Telephone Encounter (Signed)
LVM for patient to call back for message from provider.  Amber Barnes,cma

## 2021-12-31 ENCOUNTER — Other Ambulatory Visit: Payer: Self-pay | Admitting: Family

## 2021-12-31 DIAGNOSIS — K760 Fatty (change of) liver, not elsewhere classified: Secondary | ICD-10-CM

## 2021-12-31 NOTE — Telephone Encounter (Signed)
I called and spoke with the patient and informed her of the message from he provider and she is okay with seeing a surgeon about her hip and she is okay with a [place] if needed.  Gracilyn Gunia,cma

## 2022-01-03 ENCOUNTER — Ambulatory Visit
Admission: RE | Admit: 2022-01-03 | Discharge: 2022-01-03 | Disposition: A | Discharge status: Home / Self Care | Payer: Medicare Other | Source: Ambulatory Visit | Attending: Family Medicine | Admitting: Family Medicine

## 2022-01-03 ENCOUNTER — Ambulatory Visit
Admission: RE | Admit: 2022-01-03 | Discharge: 2022-01-03 | Disposition: A | Discharge status: Home / Self Care | Payer: Medicare Other | Source: Ambulatory Visit | Attending: Oncology | Admitting: Oncology

## 2022-01-03 DIAGNOSIS — R936 Abnormal findings on diagnostic imaging of limbs: Secondary | ICD-10-CM | POA: Diagnosis not present

## 2022-01-03 DIAGNOSIS — M7989 Other specified soft tissue disorders: Secondary | ICD-10-CM

## 2022-01-03 DIAGNOSIS — Z1239 Encounter for other screening for malignant neoplasm of breast: Secondary | ICD-10-CM | POA: Diagnosis not present

## 2022-01-03 DIAGNOSIS — Z853 Personal history of malignant neoplasm of breast: Secondary | ICD-10-CM | POA: Diagnosis not present

## 2022-01-03 DIAGNOSIS — C50919 Malignant neoplasm of unspecified site of unspecified female breast: Secondary | ICD-10-CM | POA: Diagnosis present

## 2022-01-03 DIAGNOSIS — R921 Mammographic calcification found on diagnostic imaging of breast: Secondary | ICD-10-CM | POA: Diagnosis not present

## 2022-01-03 DIAGNOSIS — Z9889 Other specified postprocedural states: Secondary | ICD-10-CM | POA: Insufficient documentation

## 2022-01-04 ENCOUNTER — Encounter: Payer: Self-pay | Admitting: Surgery

## 2022-01-04 ENCOUNTER — Ambulatory Visit (INDEPENDENT_AMBULATORY_CARE_PROVIDER_SITE_OTHER): Payer: Medicare Other | Admitting: Surgery

## 2022-01-04 VITALS — BP 131/79 | HR 60 | Temp 98.1°F | Ht 65.0 in | Wt 261.8 lb

## 2022-01-04 DIAGNOSIS — M25552 Pain in left hip: Secondary | ICD-10-CM

## 2022-01-04 NOTE — Progress Notes (Signed)
Patient ID: Amber Barnes, female   DOB: [birth year], 71 y.o.   MRN: [id number]  Chief Complaint: Left hip mass for 2 to 3 months  History of Present Illness Amber Barnes is a 71 y.o. female with a reportedly enlarging left hip mass over the last 2 to 3 months.  She reports her pain is a 10 out of 10.  She has had no drainage from the area.  She denies nausea, vomiting, fevers and chills.  She reports it seems to be exacerbated at night when trying to sleep because she is awake and tears.  She also reports direct pressure exacerbates the pain.  And various other activities like exercise in the pool, walking etc.  She has utilized medications of Xanax and Tylenol to help her with pain, she also reports massaging the area to help with pain as well.  Otherwise nothing works to help her with her pain.  Past Medical History Past Medical History:  Diagnosis Date   Achilles tendinitis    Acute medial meniscal tear    Allergy    Anxiety    Atrial fibrillation ([place])    a. new onset 07/2016; b. CHADS2VASc --> 4 (HTN, female, age, CHF); c. eliquis   CHF (congestive heart failure) (HCC)    Chronic anticoagulation    Apixaban   Chronic lower back pain    Colon polyp    Complication of anesthesia    Emergence delirium; hypoxia   COPD (chronic obstructive pulmonary disease) ([place])    Cystic breast    Depression    Endometriosis    Fibrocystic breast disease    GERD (gastroesophageal reflux disease)    History of stress test    a. 07/2016 MV: EF 55-65%. Small, mild defect  in mid anteroseptal, apical septal, and apical locations. No ischemia-->low risk.   Hyperlipidemia    Hypertension    Insomnia    Lipoma    Malignant neoplasm of upper-outer quadrant of right female breast (HCC)    Stage 1A invasive mammary carcinoma (cT1b or T1c N0); ER (-), PR (+), HER2/neu (+)   Migraine    Mild Mitral regurgitation    a. 07/2016 Echo: EF 55-65%, no rwma, mild MR. Mildly dil LA. Nl RV fx.     Nephrolithiasis    OSA (obstructive sleep apnea)    non-compliant with nocturnal PAP therapy   Osteoarthritis    left knee   Osteopenia    Peptic ulcer disease    a. H. pylori   Pneumonia    RLS (restless legs syndrome)    Tendonitis    Thyroid disease    Vitamin D deficiency       Past Surgical History:  Procedure Laterality Date   ABDOMINAL HYSTERECTOMY     ovaries left   ACHILLES TENDON SURGERY     2012,01/2017   BACK SURGERY     fusion lumbar   BREAST BIOPSY Right 01/15/2021   Affirm biopsy-"X" clip-INVASIVE MAMMARY CARCINOMA   BREAST BIOPSY Right 01/15/2021   u/s bx-"venus" clip INVASIVE MAMMARY CARCINOMA   BREAST BIOPSY     BREAST LUMPECTOMY,RADIO FREQ LOCALIZER,AXILLARY SENTINEL LYMPH NODE BIOPSY Right 02/17/2021   Procedure: BREAST [place] LYMPH NODE BIOPSY;  Surgeon: Ronny Bacon, MD;  Location: [facility];  Service: General;  Laterality: Right;   CARDIOVERSION N/A 09/07/2016   Procedure: CARDIOVERSION;  Surgeon: Minna Merritts, MD;  Location: [facility];  Service: Cardiovascular;  Laterality: N/A;   CARDIOVERSION N/A 10/19/2016   Procedure:  CARDIOVERSION;  Surgeon: Minna Merritts, MD;  Location: [facility];  Service: Cardiovascular;  Laterality: N/A;   CARDIOVERSION N/A 12/14/2016   Procedure: CARDIOVERSION;  Surgeon: Minna Merritts, MD;  Location: [facility];  Service: Cardiovascular;  Laterality: N/A;   COLONOSCOPY     COLONOSCOPY WITH PROPOFOL N/A 05/13/2020   Procedure: COLONOSCOPY WITH PROPOFOL;  Surgeon: Toledo, Benay Pike, MD;  Location: [facility];  Service: Gastroenterology;  Laterality: N/A;  ELIQUIS   CYSTOSCOPY WITH STENT PLACEMENT Bilateral 07/12/2021   Procedure: CYSTOSCOPY WITH STENT PLACEMENT;  Surgeon: Billey Co, MD;  Location: [facility];  Service: Urology;  Laterality: Bilateral;   CYSTOSCOPY/URETEROSCOPY/HOLMIUM LASER/STENT PLACEMENT Bilateral 07/30/2021   Procedure:  CYSTOSCOPY/URETEROSCOPY/HOLMIUM LASER/BILATERAL STENT REMOVAL;  Surgeon: Billey Co, MD;  Location: [facility];  Service: Urology;  Laterality: Bilateral;   ESOPHAGOGASTRODUODENOSCOPY     ESOPHAGOGASTRODUODENOSCOPY (EGD) WITH PROPOFOL N/A 05/13/2020   Procedure: ESOPHAGOGASTRODUODENOSCOPY (EGD) WITH PROPOFOL;  Surgeon: Toledo, Benay Pike, MD;  Location: [facility];  Service: Gastroenterology;  Laterality: N/A;   FOOT SURGERY Left    tendon   JOINT REPLACEMENT Right    hip   PORTA CATH INSERTION N/A 03/23/2021   Procedure: PORTA CATH INSERTION;  Surgeon: Katha Cabal, MD;  Location: [facility];  Service: Cardiovascular;  Laterality: N/A;   PORTACATH PLACEMENT N/A 03/23/2021   Procedure: ATTEMPTED INSERTION PORT-A-CATH;  Surgeon: Ronny Bacon, MD;  Location: [facility];  Service: General;  Laterality: N/A;   THYROIDECTOMY     thyroid lobectomy secondary to a benign nodule   TOTAL HIP ARTHROPLASTY Right 08/21/2018   Procedure: TOTAL HIP ARTHROPLASTY ANTERIOR APPROACH-RIGHT CELL SAVER REQUESTED;  Surgeon: [name], MD;  Location: [facility];  Service: Orthopedics;  Laterality: Right;    Allergies  Allergen Reactions   Prednisone Other (See Comments)    Agitation, hyperactivity, polyphagia   Amiodarone Nausea And Vomiting   Hydrocodone-Acetaminophen Other (See Comments)    Hallucinations and combative   Oxycodone Itching    Can take it with benadryl   Tapentadol Itching   Tramadol Itching    Can take it with benadryl   Adhesive [Tape] Itching and Rash    Prefers paper tape   Latex Itching and Rash    use paper tap    Current Outpatient Medications  Medication Sig Dispense Refill   acetaminophen (TYLENOL) 500 MG tablet Take 500-1,000 mg by mouth every 6 (six) hours as needed for mild pain or moderate pain.     ALPRAZolam (XANAX) 0.25 MG tablet Take 1 tablet (0.25 mg total) by mouth 2 (two) times daily as needed for anxiety. 60 tablet 2   apixaban (ELIQUIS)  5 MG TABS tablet Take 5 mg by mouth 2 (two) times daily.     azelastine (ASTELIN) 0.1 % nasal spray Place 1-2 sprays into both nostrils daily as needed for rhinitis.     bisoprolol (ZEBETA) 5 MG tablet Take 5 mg by mouth in the morning and at bedtime.     busPIRone (BUSPAR) 5 MG tablet TAKE 1 TABLET BY MOUTH THREE TIMES A DAY AS NEEDED 60 tablet 1   Cholecalciferol (VITAMIN D3) 5000 units CAPS Take 5,000 Units by mouth daily.     clotrimazole (LOTRIMIN) 1 % cream Apply 1 application topically 2 (two) times daily. 30 g 0   diphenhydrAMINE (BENADRYL) 25 mg capsule Take 1 capsule (25 mg total) by mouth every 8 (eight) hours as needed for itching or allergies. 30 capsule 0   esomeprazole (NEXIUM)  20 MG capsule Take 20 mg by mouth daily as needed (GERD symptoms).     fluconazole (DIFLUCAN) 150 MG tablet Take 1 tablet (150 mg total) by mouth once a week. X 2-4 weeks 4 tablet 0   FLUoxetine (PROZAC) 20 MG capsule Take 1 capsule (20 mg total) by mouth every morning. 90 capsule 3   furosemide (LASIX) 20 MG tablet TAKE 1 TABLET BY MOUTH TWICE A DAY 180 tablet 0   gabapentin (NEURONTIN) 100 MG capsule Take 1 capsule (100 mg total) by mouth 3 (three) times daily. 270 capsule 0   hydrocortisone 2.5 % ointment Apply topically 2 (two) times daily. prn 60 g 2   KLOR-CON M20 20 MEQ tablet TAKE 1 TABLET BY MOUTH EVERY DAY 90 tablet 1   lidocaine-prilocaine (EMLA) cream Apply to affected area once 30 g 3   loperamide (IMODIUM) 2 MG capsule Take 1 capsule (2 mg total) by mouth See admin instructions. Initial: 4 mg, followed by 2 mg after each loose stool; maximum: 16 mg/day 60 capsule 1   meclizine (ANTIVERT) 12.5 MG tablet Take 1 tablet (12.5 mg total) by mouth 3 (three) times daily as needed for dizziness. 30 tablet 0   Multiple Vitamin (MULTIVITAMIN) tablet Take 1 tablet by mouth daily.     nystatin (MYCOSTATIN/NYSTOP) powder Apply 1 application. topically 3 (three) times daily. 60 g 11   nystatin cream  (MYCOSTATIN) Apply 1 application. topically 2 (two) times daily. Prn abdomen 60 g 5   ondansetron (ZOFRAN) 8 MG tablet Take 1 tablet (8 mg total) by mouth every 8 (eight) hours as needed for nausea or vomiting. 60 tablet 0   prochlorperazine (COMPAZINE) 10 MG tablet Take 1 tablet (10 mg total) by mouth every 6 (six) hours as needed (Nausea or vomiting). 30 tablet 1   promethazine (PHENERGAN) 25 MG tablet Take 1 tablet (25 mg total) by mouth every 6 (six) hours as needed for nausea or vomiting. 60 tablet 0   ranolazine (RANEXA) 500 MG 12 hr tablet TAKE 1 TABLET BY MOUTH TWICE A DAY 180 tablet 1   rizatriptan (MAXALT) 10 MG tablet TAKE 1 TABLET (10 MG TOTAL) BY MOUTH AS NEEDED FOR MIGRAINE. MAY REPEAT IN 2 HOURS IF NEEDED 10 tablet 0   rOPINIRole (REQUIP) 2 MG tablet TAKE 1 TABLET BY MOUTH AT BEDTIME. 90 tablet 1   rosuvastatin (CRESTOR) 20 MG tablet TAKE 1 TABLET BY MOUTH EVERY DAY 90 tablet 0   silver sulfADIAZINE (SILVADENE) 1 % cream Apply 1 application. topically 2 (two) times daily. 50 g 3   No current facility-administered medications for this visit.    Family History Family History  Problem Relation Age of Onset   Cancer Sister 92       ovarian   Depression Sister    Cancer Brother        CLL   Cancer Father 24       colon   Heart disease Father    Heart attack Father 52       died from MI   Hyperlipidemia Father    Hypertension Father    Cancer Mother        leukemia   Breast cancer Paternal Aunt        71's   Migraines Neg Hx       Social History Social History   Tobacco Use   Smoking status: Former    Packs/day: 0.25    Years: 10.00    Total pack years:  2.50    Types: Cigarettes    Quit date: 2005    Years since quitting: 18.4   Smokeless tobacco: Never   Tobacco comments:    smoked for 10 years, 1 pack every 2-3 days  Vaping Use   Vaping Use: Never used  Substance Use Topics   Alcohol use: Not Currently    Alcohol/week: 1.0 standard drink of alcohol     Types: 1 Glasses of wine per week    Comment: rarely   Drug use: No       Physical Exam Blood pressure 131/79, pulse 60, temperature 98.1 F (36.7 C), temperature source Oral, height _0  (1.651 m), weight 261 lb 12.8 oz (118.8 kg), SpO2 97 %. Last Weight  Most recent update: 01/04/2022  3:12 PM    Weight  118.8 kg (261 lb 12.8 oz)             CONSTITUTIONAL: Well developed, and nourished, appropriately responsive and aware without distress.   EYES: Sclera non-icteric.   EARS, NOSE, MOUTH AND THROAT:  The oropharynx is clear. Oral mucosa is pink and moist.   Hearing is intact to voice.  NECK: Trachea is midline, and there is no jugular venous distension.  LYMPH NODES:  Lymph nodes in the neck are not enlarged. RESPIRATORY:  Normal respiratory effort without pathologic use of accessory muscles. CARDIOVASCULAR: Heart is regular in rate.  GI: The abdomen is  soft, nontender, and nondistended.  MUSCULOSKELETAL:  Symmetrical muscle tone appreciated in all four extremities.    SKIN: Skin turgor is normal. No pathologic skin lesions appreciated.  Diffuse adiposity without isolated/distinct mass in the left hip area. NEUROLOGIC:  Motor and sensation appear grossly normal.  Cranial nerves are grossly without defect. PSYCH:  Alert and oriented to person, place and time. Affect is appropriate for situation.  Data Reviewed I have personally reviewed what is currently available of the patient's imaging, recent labs and medical records.   Labs:     Latest Ref Rng & Units 12/29/2021   12:44 PM 12/08/2021   12:48 PM 11/17/2021    8:51 AM  CBC  WBC 4.0 - 10.5 K/uL 3.9  4.7  3.6   Hemoglobin 12.0 - 15.0 g/dL 12.1  12.2  12.5   Hematocrit 36.0 - 46.0 % 36.1  36.7  37.6   Platelets 150 - 400 K/uL 163  173  151       Latest Ref Rng & Units 12/29/2021   12:44 PM 12/08/2021   12:48 PM 11/17/2021    8:51 AM  CMP  Glucose 70 - 99 mg/dL 108  124  95   BUN 8 - 23 mg/dL _1 Creatinine  0.44 - 1.00 mg/dL 0.75  0.79  0.85   Sodium 135 - 145 mmol/L 139  141  135   Potassium 3.5 - 5.1 mmol/L 3.8  3.7  3.6   Chloride 98 - 111 mmol/L 104  104  102   CO2 22 - 32 mmol/L _2 Calcium 8.9 - 10.3 mg/dL 8.4  8.7  8.4   Total Protein 6.5 - 8.1 g/dL 6.6  6.7  6.9   Total Bilirubin 0.3 - 1.2 mg/dL 0.5  0.6  0.7   Alkaline Phos 38 - 126 U/L 73  71  74   AST 15 - 41 U/L _3 ALT 0 - 44 U/L 17  15  14  Imaging:  Within last 24 hrs: No results found.  Assessment    Sciatica, left. Adiposity, left hip buttock area. Patient Active Problem List   Diagnosis Date Noted   Dizziness 12/10/2021   Leg swelling 10/27/2021   Encounter for monoclonal antibody treatment for malignancy 09/29/2021   Ureterovesical junction (UVJ) obstruction 07/12/2021   Acute pyelonephritis 07/12/2021   CKD (chronic kidney disease), stage II 07/12/2021   HLD (hyperlipidemia) 07/12/2021   Chronic diastolic CHF (congestive heart failure) ([place]) 07/12/2021   Hydroureteronephrosis 07/12/2021   Pyelonephritis 07/02/2021   Drug-induced neutropenia ([place]) 06/23/2021   CKD (chronic kidney disease) 06/21/2021   Chemotherapy induced diarrhea 06/09/2021   Anemia due to antineoplastic chemotherapy 05/18/2021   Chemotherapy induced nausea and vomiting 05/18/2021   COVID-19 04/30/2021   Neutropenia (HCC)    Anxiety    Intractable vomiting    Diarrhea    Neutropenic fever ([place]) 04/05/2021   Encounter for antineoplastic chemotherapy 03/24/2021   Neuropathy 03/24/2021   Port-A-Cath in place 03/24/2021   HER2-positive carcinoma of breast ([place]) 01/26/2021   Goals of care, counseling/discussion 01/26/2021   Malignant neoplasm of upper-outer quadrant of right female breast ([place]) 01/19/2021   Left arm pain 12/02/2020   Soft tissue mass 12/02/2020   Candidal intertrigo 09/09/2020   Urinary frequency 09/09/2020   Night sweat 03/31/2020   Nasal drainage 12/18/2019   Sinus congestion 12/18/2019    Nausea 10/30/2019   Major depression in remission ([place]) 08/27/2018   Primary osteoarthritis involving multiple joints 08/27/2018   Atrial fibrillation ([place]) 08/27/2018   COPD (chronic obstructive pulmonary disease) ([place]) 08/27/2018   BMI 50.0-59.9, adult ([place]) 07/16/2018   Morbid obesity with BMI of 50.0-59.9, adult ([place]) 05/31/2018   Bradycardia 05/22/2017   Restless leg syndrome 04/13/2017   OSA (obstructive sleep apnea) 01/09/2017   A-fib ([place]) 11/28/2016   Atrial fibrillation with RVR ([place]) 07/27/2016   Fatty liver disease, nonalcoholic 28/76/8115   Vertigo 06/08/2016   Family history of ovarian cancer 06/08/2016   GERD (gastroesophageal reflux disease) 05/05/2016   Hypertension 11/11/2014   Anxiety and depression 11/11/2014   Weight loss 11/11/2014    Plan    In order to achieve realistic expectations, I explained to the patient that the soft tissue overlying her area of tenderness is not going to exacerbate the tenderness to that region.  Its removal will be unhelpful in terms of the underlying issues.  She reported that it was exacerbated during exercise in the pool, and ambulating.  Improved with Xanax, Tylenol and massaging the area. Considering her multiple other core morbidities and other areas of concern, I suspect we need to continue to focus on major areas involving her health.  I certainly cannot encourage excision of this area of adiposity to be helpful for her left hip pain.  Face-to-face time spent with the patient and accompanying care providers(if present) was 30 minutes, with more than 50% of the time spent counseling, educating, and coordinating care of the patient.    These notes generated with voice recognition software. I apologize for typographical errors.  Ronny Bacon M.D., FACS 01/04/2022, 3:35 PM

## 2022-01-05 DIAGNOSIS — M25552 Pain in left hip: Secondary | ICD-10-CM | POA: Insufficient documentation

## 2022-01-07 DIAGNOSIS — C50911 Malignant neoplasm of unspecified site of right female breast: Secondary | ICD-10-CM | POA: Diagnosis not present

## 2022-01-07 DIAGNOSIS — G629 Polyneuropathy, unspecified: Secondary | ICD-10-CM | POA: Diagnosis not present

## 2022-01-07 DIAGNOSIS — J449 Chronic obstructive pulmonary disease, unspecified: Secondary | ICD-10-CM | POA: Diagnosis not present

## 2022-01-07 DIAGNOSIS — I509 Heart failure, unspecified: Secondary | ICD-10-CM | POA: Diagnosis not present

## 2022-01-07 DIAGNOSIS — M1712 Unilateral primary osteoarthritis, left knee: Secondary | ICD-10-CM | POA: Diagnosis not present

## 2022-01-12 ENCOUNTER — Encounter: Payer: Self-pay | Admitting: Family

## 2022-01-13 ENCOUNTER — Other Ambulatory Visit: Payer: Self-pay | Admitting: Cardiovascular Disease

## 2022-01-13 NOTE — Telephone Encounter (Signed)
Refill request

## 2022-01-13 NOTE — Telephone Encounter (Signed)
Prescription refill request for Eliquis received. Indication: Atrial Fib Last office visit: 11/08/21  Johnny Bridge MD Scr: 0.75 on 12/29/21 Age: 71 Weight: 119kg  Based on above findings Eliquis '5mg'$  twice daily is the appropriate dose.  Refill approved.

## 2022-01-19 ENCOUNTER — Inpatient Hospital Stay: Payer: Medicare Other | Attending: Oncology

## 2022-01-19 ENCOUNTER — Inpatient Hospital Stay (HOSPITAL_BASED_OUTPATIENT_CLINIC_OR_DEPARTMENT_OTHER): Payer: Medicare Other | Admitting: Oncology

## 2022-01-19 ENCOUNTER — Telehealth: Payer: Self-pay | Admitting: Oncology

## 2022-01-19 ENCOUNTER — Inpatient Hospital Stay: Payer: Medicare Other

## 2022-01-19 ENCOUNTER — Encounter: Payer: Self-pay | Admitting: Oncology

## 2022-01-19 VITALS — BP 152/91 | HR 55 | Temp 97.1°F | Resp 18 | Wt 259.9 lb

## 2022-01-19 DIAGNOSIS — Z79899 Other long term (current) drug therapy: Secondary | ICD-10-CM | POA: Diagnosis not present

## 2022-01-19 DIAGNOSIS — Z7901 Long term (current) use of anticoagulants: Secondary | ICD-10-CM | POA: Insufficient documentation

## 2022-01-19 DIAGNOSIS — Z5112 Encounter for antineoplastic immunotherapy: Secondary | ICD-10-CM | POA: Diagnosis not present

## 2022-01-19 DIAGNOSIS — I4891 Unspecified atrial fibrillation: Secondary | ICD-10-CM

## 2022-01-19 DIAGNOSIS — Z87891 Personal history of nicotine dependence: Secondary | ICD-10-CM | POA: Diagnosis not present

## 2022-01-19 DIAGNOSIS — C50411 Malignant neoplasm of upper-outer quadrant of right female breast: Secondary | ICD-10-CM | POA: Diagnosis not present

## 2022-01-19 DIAGNOSIS — G629 Polyneuropathy, unspecified: Secondary | ICD-10-CM | POA: Diagnosis not present

## 2022-01-19 DIAGNOSIS — Z171 Estrogen receptor negative status [ER-]: Secondary | ICD-10-CM | POA: Diagnosis not present

## 2022-01-19 DIAGNOSIS — Z5111 Encounter for antineoplastic chemotherapy: Secondary | ICD-10-CM

## 2022-01-19 DIAGNOSIS — C50919 Malignant neoplasm of unspecified site of unspecified female breast: Secondary | ICD-10-CM

## 2022-01-19 LAB — CBC WITH DIFFERENTIAL/PLATELET
Abs Immature Granulocytes: 0.01 10*3/uL (ref 0.00–0.07)
Basophils Absolute: 0 10*3/uL (ref 0.0–0.1)
Basophils Relative: 0 %
Eosinophils Absolute: 0.1 10*3/uL (ref 0.0–0.5)
Eosinophils Relative: 1 %
HCT: 36.3 % (ref 36.0–46.0)
Hemoglobin: 12.3 g/dL (ref 12.0–15.0)
Immature Granulocytes: 0 %
Lymphocytes Relative: 25 %
Lymphs Abs: 1.1 10*3/uL (ref 0.7–4.0)
MCH: 31.6 pg (ref 26.0–34.0)
MCHC: 33.9 g/dL (ref 30.0–36.0)
MCV: 93.3 fL (ref 80.0–100.0)
Monocytes Absolute: 0.3 10*3/uL (ref 0.1–1.0)
Monocytes Relative: 7 %
Neutro Abs: 3 10*3/uL (ref 1.7–7.7)
Neutrophils Relative %: 67 %
Platelets: 146 10*3/uL — ABNORMAL LOW (ref 150–400)
RBC: 3.89 MIL/uL (ref 3.87–5.11)
RDW: 13.8 % (ref 11.5–15.5)
WBC: 4.5 10*3/uL (ref 4.0–10.5)
nRBC: 0 % (ref 0.0–0.2)

## 2022-01-19 LAB — COMPREHENSIVE METABOLIC PANEL
ALT: 14 U/L (ref 0–44)
AST: 20 U/L (ref 15–41)
Albumin: 3.7 g/dL (ref 3.5–5.0)
Alkaline Phosphatase: 75 U/L (ref 38–126)
Anion gap: 7 (ref 5–15)
BUN: 15 mg/dL (ref 8–23)
CO2: 26 mmol/L (ref 22–32)
Calcium: 8.4 mg/dL — ABNORMAL LOW (ref 8.9–10.3)
Chloride: 103 mmol/L (ref 98–111)
Creatinine, Ser: 0.89 mg/dL (ref 0.44–1.00)
GFR, Estimated: >60 mL/min (ref >60)
Glucose, Bld: 107 mg/dL — ABNORMAL HIGH (ref 70–99)
Potassium: 3.9 mmol/L (ref 3.5–5.1)
Sodium: 136 mmol/L (ref 135–145)
Total Bilirubin: 0.8 mg/dL (ref 0.3–1.2)
Total Protein: 6.7 g/dL (ref 6.5–8.1)

## 2022-01-19 MED ORDER — HEPARIN SOD (PORK) LOCK FLUSH 100 UNIT/ML IV SOLN
500.0000 [IU] | Freq: Once | INTRAVENOUS | Status: AC | PRN
  Administered 2022-01-19: 500 [IU]
  Filled 2022-01-19: qty 5

## 2022-01-19 MED ORDER — DIPHENHYDRAMINE HCL 25 MG PO CAPS
50.0000 mg | ORAL_CAPSULE | Freq: Once | ORAL | Status: AC
  Administered 2022-01-19: 50 mg via ORAL
  Filled 2022-01-19: qty 2

## 2022-01-19 MED ORDER — ACETAMINOPHEN 325 MG PO TABS
650.0000 mg | ORAL_TABLET | Freq: Once | ORAL | Status: AC
  Administered 2022-01-19: 650 mg via ORAL
  Filled 2022-01-19: qty 2

## 2022-01-19 MED ORDER — TRASTUZUMAB-ANNS CHEMO 150 MG IV SOLR
700.0000 mg | Freq: Once | INTRAVENOUS | Status: AC
  Administered 2022-01-19: 700 mg via INTRAVENOUS
  Filled 2022-01-19: qty 33.33

## 2022-01-19 MED ORDER — SODIUM CHLORIDE 0.9 % IV SOLN
Freq: Once | INTRAVENOUS | Status: AC
  Filled 2022-01-19: qty 250

## 2022-01-19 NOTE — Progress Notes (Signed)
Patient here for follow up. Pt wanting to know if the treatment she is taking can cause fluid retention.

## 2022-01-19 NOTE — Patient Instructions (Signed)
[facility]  Discharge Instructions: Thank you for choosing [facility] to provide your oncology and hematology care.  If you have a lab appointment with the Cancer Center, please go directly to the Cancer Center and check in at the registration area.  Wear comfortable clothing and clothing appropriate for easy access to any Portacath or PICC line.   We strive to give you quality time with your provider. You may need to reschedule your appointment if you arrive late (15 or more minutes).  Arriving late affects you and other patients whose appointments are after yours.  Also, if you miss three or more appointments without notifying the office, you may be dismissed from the clinic at the provider's discretion.      For prescription refill requests, have your pharmacy contact our office and allow 72 hours for refills to be completed.    Today you received the following chemotherapy and/or immunotherapy agents- trastuzumab      To help prevent nausea and vomiting after your treatment, we encourage you to take your nausea medication as directed.  BELOW ARE SYMPTOMS THAT SHOULD BE REPORTED IMMEDIATELY: *FEVER GREATER THAN 100.4 F (38 C) OR HIGHER *CHILLS OR SWEATING *NAUSEA AND VOMITING THAT IS NOT CONTROLLED WITH YOUR NAUSEA MEDICATION *UNUSUAL SHORTNESS OF BREATH *UNUSUAL BRUISING OR BLEEDING *URINARY PROBLEMS (pain or burning when urinating, or frequent urination) *BOWEL PROBLEMS (unusual diarrhea, constipation, pain near the anus) TENDERNESS IN MOUTH AND THROAT WITH OR WITHOUT PRESENCE OF ULCERS (sore throat, sores in mouth, or a toothache) UNUSUAL RASH, SWELLING OR PAIN  UNUSUAL VAGINAL DISCHARGE OR ITCHING   Items with * indicate a potential emergency and should be followed up as soon as possible or go to the Emergency Department if any problems should occur.  Please show the CHEMOTHERAPY ALERT CARD or IMMUNOTHERAPY ALERT CARD at check-in  to the Emergency Department and triage nurse.  Should you have questions after your visit or need to cancel or reschedule your appointment, please contact [facility]  [phone] and follow the prompts.  Office hours are 8:00 a.m. to 4:30 p.m. Monday - Friday. Please note that voicemails left after 4:00 p.m. may not be returned until the following business day.  We are closed weekends and major holidays. You have access to a nurse at all times for urgent questions. Please call the main number to the clinic [phone] and follow the prompts.  For any non-urgent questions, you may also contact your provider using MyChart. We now offer e-Visits for anyone 18 and older to request care online for non-urgent symptoms. For details visit [email or web address].   Also download the MyChart app! Go to the app store, search "MyChart", open the app, select [facility], and log in with your MyChart username and password.  Masks are optional in the cancer centers. If you would like for your care team to wear a mask while they are taking care of you, please let them know. For doctor visits, patients may have with them one support person who is at least 69 years old. At this time, visitors are not allowed in the infusion area.   

## 2022-01-19 NOTE — Assessment & Plan Note (Signed)
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  She may increase dose of 200 mg at bedtime.  

## 2022-01-19 NOTE — Progress Notes (Signed)
Hematology/Oncology Progress note Telephone:([phone] Fax:([phone]) [phone]      ASSESSMENT & PLAN:   Cancer Staging  Malignant neoplasm of upper-outer quadrant of right female breast [facility]) Staging form: Breast, AJCC 8th Edition - Pathologic stage from 03/03/2021: Stage IA (pT1b, pN0, cM0, G3, ER-, PR+, HER2+) - Signed by [name], MD on 03/03/2021   Malignant neoplasm of upper-outer quadrant of right female breast [facility]) #Right breast invasive carcinoma pT1b pN0 ER negative, PR weakly positive HER 2 positive.  Patient does not tolerate adjuvant chemotherapy with Taxol and trastuzumab despite Taxol dose reduction.-->Status post adjuvant radiation--> maintenance Trastuzumab Labs are reviewed and discussed with patient. Proceed with trastuzumab maintenance Repeat echocardiogram in August 2023.  Neuropathy Pre-existing neuropathy in her fingertips bilaterally- secondary to cervical spine radiculopathy continue 100 mg 3 times daily.  She may increase dose of 200 mg at bedtime.   Encounter for monoclonal antibody treatment for malignancy Treatment plan as needed.   Atrial fibrillation [facility]) Follow-up with cardiology.  Patient is on Eliquis 5 mg twice daily.  Orders Placed This Encounter  Procedures   US BREAST LTD UNI RIGHT INC AXILLA    Standing Status:   Future    Standing Expiration Date:   01/20/2023    Order Specific Question:   Reason for Exam (SYMPTOM  OR DIAGNOSIS REQUIRED)    Answer:   breast cancer    Order Specific Question:   Preferred imaging location?    Answer:   [facility]   MM DIAG BREAST TOMO UNI RIGHT    Standing Status:   Future    Standing Expiration Date:   01/20/2023    Order Specific Question:   Reason for Exam (SYMPTOM  OR DIAGNOSIS REQUIRED)    Answer:   breast cancer    Order Specific Question:   Preferred imaging location?    Answer:   [facility]   ECHOCARDIOGRAM COMPLETE    Standing Status:   Future    Standing Expiration Date:    01/20/2023    Order Specific Question:   Where should this test be performed    Answer:   [facility]    Order Specific Question:   Perflutren DEFINITY (image enhancing agent) should be administered unless hypersensitivity or allergy exist    Answer:   Administer Perflutren    Order Specific Question:   Reason for exam-Echo    Answer:   Chemo  Z09   Follow up  3 weeks lab MD trastuzumab.  Schedule right diagnostic mammogram in 6 months. All questions were answered. The patient knows to call the clinic with any problems, questions or concerns.  [name], MD, PhD [facility] Hematology Oncology 01/19/2022   CHIEF COMPLAINTS/REASON FOR VISIT:  Follow-up for stage I HER2 positive breast cancer  HISTORY OF PRESENTING ILLNESS:   Amber Barnes is a  71 y.o.  female with PMH listed below was seen in consultation at the request of  [name], FNP  for evaluation of weight loss, family history ovarian cancer. Patient reports a history of nausea vomiting, early satiety, dysphagia symptoms that started in March.  She has lost significant amount of weight as well. Patient weighed 340 pounds in February 2021, 309 pounds in May 2021 and currently she weighs 296 pounds today. Patient has tried Zofran and scopolamine patch which did not relieve her symptoms. 11/05/2019, CT abdomen pelvis showed chronic changes with out acute abnormality to correspond with the patient's symptoms. Patient has had right upper quadrant ultrasound  done on 11/14/2019, which showed hepatic steatosis with no other acute abnormality. Patient was seen by gastroenterology Dr. [name] on 11/18/2019.  Patient was recommended to take PPI daily.  Added Carafate.  H. pylori testing came back positive and the patient was started on a course of tetracycline, metronidazole, Pepto-Bismol for H. pylori treatments.  Patient reports that she has been on the treatment for 7 days now.  Nausea is slightly better but still persistent.   She is following up with gastroenterology next week for discussion of EGD. Currently she is not able to tolerate colonoscopy prep due to the nausea.  Her stool was tested positive for occult blood. She also reports night sweating which is a chronic symptom for her. Patient was last seen on 05/20/2020 for evaluation of weight loss, family history of ovarian cancer patient was referred to establish care with genetic counselor.  Patient eventually declined genetic testing.  12/30/2020, patient had a screening mammogram done which showed possible mass and asymmetry in the right breast. 01/08/2021, diagnostic mammogram of the right breast showed 2 adjacent irregular spiculated masses.  Larger 1 was 10 mm and a smaller mass measured 5 mm. 01/15/2021 stereotactic biopsy of the right breast smaller mass showed invasive carcinoma no special type, grade 2, DCIS present, LVI negative. Ultrasound guided biopsy of the larger mass showed invasive mammary carcinoma,   Patient has appointment with surgery Dr. [name] later this week.  She was referred to establish care with oncology for evaluation.  She was accompanied by daughter. She has no new complaints.  Family history of breast cancer: Denies Family history of other cancers: Patient has a strong family history of leukemia in her mother, colon cancer in her father at age of 70, ovarian cancer in her sister at age of 80, CLL in her brother  02/17/2021, patient is status post lumpectomy and sentinel lymph node biopsy Pathology showed invasive mammary carcinoma, 2 separate foci, DCIS, negative sentinel lymph node biopsy 0/1, grade 3, or margins negative for invasive carcinoma.  ER -, PR + [1-10%], HER2 +  pT1b(m) pN0   # Strong family history of cancer, Discussed again about genetic testing. patient adamantly declined.  #  Intermittent dizziness which per patient is chronic for her due to the vertigo. #03/23/2021 Mediport by vascular surgeon #History of  CHF, 03/08/2021, echocardiogram showed LVEF 60-65%. Per patient, she has been seen by Dr. [name] team and was cleared for proceeding with chemotherapy.   03/24/2021 - 03/31/2021 Adjuvant chemotherapy Taxol 80 mg/m2 [name] 04/05/2021 - 04/09/2021 patient was admitted due to neutropenic fever with associated SIRS.  Patient was treated with empiric cefepime and vancomycin until date of discharge when [place]ble recovered to 1.4.  Post hospitalization, patient also had COVID-19 infection so her follow-up appointment was further postponed for another 2 weeks.  05/10/2021 resumed dose reduce Taxol 23m/m2 /[name]11/15/2022-06/30/2021, cycle 2 dose reduced Taxol 6108mm2 /trastuzumab  admitted from 07/12/2021 - 07/17/2021 due to acute pyelonephritis UVJ obstruction and a right hydro ureteralnephrosis.  Patient was treated with IV antibiotics status post emergent bilateral ureteral stent placement.  Discharged home with a course of oral antibiotics.  08/18/2021 - 09/08/2021, status post adjuvant radiation.  Patient had experienced skin toxicities from radiation.  Symptoms are better since the completion of radiation.  10/06/2021, shared decision was made to proceed with maintenance trastuzumab.  Her cardiologist Dr. GoRockey Situave clearance to proceed.  Left butt/thigh tenderness for a few weeks, she was seen by Dr.Sonnenberg who feels it is likely  a lipoma, and a ultrasound soft tissue left lower extremity was ordered for further evaluation.  INTERVAL HISTORY Amber Barnes is a 71 y.o. female who has above history reviewed by me today presents for follow up visit for management of right side HER 2 positive breast cancer Patient is currently on maintenance trastuzumab.  She denies SOB, chest pain, nausea vomiting or diarrhea. During the interval, she has noticed increased lower extremity edema 1 day, and took extra dose of Lasix which improved her symptoms.  She also noticed fast heartbeat after swimming a  few strokes.  Symptom resolved spontaneously.  Review of Systems  Constitutional:  Positive for fatigue. Negative for appetite change, chills, fever and unexpected weight change.       Patient sits in the wheelchair  HENT:   Negative for hearing loss and voice change.   Respiratory:  Negative for chest tightness and cough.   Cardiovascular:  Positive for leg swelling. Negative for chest pain.  Gastrointestinal:  Negative for abdominal distention, abdominal pain, blood in stool, nausea and vomiting.  Endocrine: Negative for hot flashes.  Genitourinary:  Negative for difficulty urinating, frequency and hematuria.   Musculoskeletal:  Negative for arthralgias.       Left butt/thigh area of tenderness, increasing in size.  Skin:  Negative for itching and rash.  Neurological:  Positive for numbness. Negative for dizziness and extremity weakness.  Hematological:  Negative for adenopathy.  Psychiatric/Behavioral:  Negative for confusion.     MEDICAL HISTORY:  Past Medical History:  Diagnosis Date   Achilles tendinitis    Acute medial meniscal tear    Allergy    Anxiety    Atrial fibrillation ([place])    a. new onset 07/2016; b. CHADS2VASc --> 4 (HTN, female, age, CHF); c. eliquis   CHF (congestive heart failure) (HCC)    Chronic anticoagulation    Apixaban   Chronic lower back pain    Colon polyp    Complication of anesthesia    Emergence delirium; hypoxia   COPD (chronic obstructive pulmonary disease) ([place])    Cystic breast    Depression    Endometriosis    Fibrocystic breast disease    GERD (gastroesophageal reflux disease)    History of stress test    a. 07/2016 MV: EF 55-65%. Small, mild defect  in mid anteroseptal, apical septal, and apical locations. No ischemia-->low risk.   Hyperlipidemia    Hypertension    Insomnia    Lipoma    Malignant neoplasm of upper-outer quadrant of right female breast (HCC)    Stage 1A invasive mammary carcinoma (cT1b or T1c N0); ER (-), PR (+),  HER2/neu (+)   Migraine    Mild Mitral regurgitation    a. 07/2016 Echo: EF 55-65%, no rwma, mild MR. Mildly dil LA. Nl RV fx.    Nephrolithiasis    OSA (obstructive sleep apnea)    non-compliant with nocturnal PAP therapy   Osteoarthritis    left knee   Osteopenia    Peptic ulcer disease    a. H. pylori   Pneumonia    RLS (restless legs syndrome)    Tendonitis    Thyroid disease    Vitamin D deficiency     SURGICAL HISTORY: Past Surgical History:  Procedure Laterality Date   ABDOMINAL HYSTERECTOMY     ovaries left   ACHILLES TENDON SURGERY     2012,01/2017   BACK SURGERY     fusion lumbar   BREAST BIOPSY Right 01/15/2021  Affirm biopsy-"X" clip-INVASIVE MAMMARY CARCINOMA   BREAST BIOPSY Right 01/15/2021   u/s bx-"venus" clip INVASIVE MAMMARY CARCINOMA   BREAST BIOPSY     BREAST LUMPECTOMY,RADIO FREQ LOCALIZER,AXILLARY SENTINEL LYMPH NODE BIOPSY Right 02/17/2021   Procedure: BREAST LUMPECTOMY,RADIO FREQ LOCALIZER,AXILLARY SENTINEL LYMPH NODE BIOPSY;  Surgeon: Ronny Bacon, MD;  Location: [facility];  Service: General;  Laterality: Right;   CARDIOVERSION N/A 09/07/2016   Procedure: CARDIOVERSION;  Surgeon: Minna Merritts, MD;  Location: [facility];  Service: Cardiovascular;  Laterality: N/A;   CARDIOVERSION N/A 10/19/2016   Procedure: CARDIOVERSION;  Surgeon: Minna Merritts, MD;  Location: [facility];  Service: Cardiovascular;  Laterality: N/A;   CARDIOVERSION N/A 12/14/2016   Procedure: CARDIOVERSION;  Surgeon: Minna Merritts, MD;  Location: [facility];  Service: Cardiovascular;  Laterality: N/A;   COLONOSCOPY     COLONOSCOPY WITH PROPOFOL N/A 05/13/2020   Procedure: COLONOSCOPY WITH PROPOFOL;  Surgeon: Toledo, Benay Pike, MD;  Location: [facility];  Service: Gastroenterology;  Laterality: N/A;  ELIQUIS   CYSTOSCOPY WITH STENT PLACEMENT Bilateral 07/12/2021   Procedure: CYSTOSCOPY WITH STENT PLACEMENT;  Surgeon: Billey Co, MD;  Location: [facility];  Service:  Urology;  Laterality: Bilateral;   CYSTOSCOPY/URETEROSCOPY/HOLMIUM LASER/STENT PLACEMENT Bilateral 07/30/2021   Procedure: CYSTOSCOPY/URETEROSCOPY/HOLMIUM LASER/BILATERAL STENT REMOVAL;  Surgeon: Billey Co, MD;  Location: [facility];  Service: Urology;  Laterality: Bilateral;   ESOPHAGOGASTRODUODENOSCOPY     ESOPHAGOGASTRODUODENOSCOPY (EGD) WITH PROPOFOL N/A 05/13/2020   Procedure: ESOPHAGOGASTRODUODENOSCOPY (EGD) WITH PROPOFOL;  Surgeon: Toledo, Benay Pike, MD;  Location: [facility];  Service: Gastroenterology;  Laterality: N/A;   FOOT SURGERY Left    tendon   JOINT REPLACEMENT Right    hip   PORTA CATH INSERTION N/A 03/23/2021   Procedure: PORTA CATH INSERTION;  Surgeon: Katha Cabal, MD;  Location: [place] [facility];  Service: Cardiovascular;  Laterality: N/A;   PORTACATH PLACEMENT N/A 03/23/2021   Procedure: ATTEMPTED INSERTION PORT-A-CATH;  Surgeon: Ronny Bacon, MD;  Location: [facility];  Service: General;  Laterality: N/A;   THYROIDECTOMY     thyroid lobectomy secondary to a benign nodule   TOTAL HIP ARTHROPLASTY Right 08/21/2018   Procedure: TOTAL HIP ARTHROPLASTY ANTERIOR APPROACH-RIGHT CELL SAVER REQUESTED;  Surgeon: [name], MD;  Location: [facility];  Service: Orthopedics;  Laterality: Right;    SOCIAL HISTORY: Social History   Socioeconomic History   Marital status: Married    Spouse name: jeffrey   Number of children: 1   Years of education: Not on file   Highest education level: High school graduate  Occupational History    Comment: disabled  Tobacco Use   Smoking status: Former    Packs/day: 0.25    Years: 10.00    Total pack years: 2.50    Types: Cigarettes    Quit date: 2005    Years since quitting: 18.5   Smokeless tobacco: Never   Tobacco comments:    smoked for 10 years, 1 pack every 2-3 days  Vaping Use   Vaping Use: Never used  Substance and Sexual Activity   Alcohol use: Not Currently    Alcohol/week: 1.0 standard drink of  alcohol    Types: 1 Glasses of wine per week    Comment: rarely   Drug use: No   Sexual activity: Not Currently  Other Topics Concern   Not on file  Social History Narrative   Moved to [place] from [place], originally from [place].    1 cup of caffeine  daily   Right handed   Lives with husband, step-daughter and child       Social Determinants of Health   Financial Resource Strain: Low Risk  (12/09/2021)   Overall Financial Resource Strain (CARDIA)    Difficulty of Paying Living Expenses: Not very hard  Food Insecurity: No Food Insecurity (12/09/2021)   Hunger Vital Sign    Worried About Running Out of Food in the Last Year: Never true    Ran Out of Food in the Last Year: Never true  Transportation Needs: No Transportation Needs (12/09/2021)   PRAPARE - Hydrologist (Medical): No    Lack of Transportation (Non-Medical): No  Physical Activity: Insufficiently Active (12/09/2021)   Exercise Vital Sign    Days of Exercise per Week: 7 days    Minutes of Exercise per Session: 20 min  Stress: Stress Concern Present (12/09/2021)   [place]    Feeling of Stress : Very much  Social Connections: Moderately Isolated (12/09/2021)   Social Connection and Isolation Panel [NHANES]    Frequency of Communication with Friends and Family: Never    Frequency of Social Gatherings with Friends and Family: Once a week    Attends Religious Services: More than 4 times per year    Active Member of [employer] or Organizations: No    Attends Archivist Meetings: Never    Marital Status: Married  Human resources officer Violence: Not At Risk (12/09/2021)   Humiliation, Afraid, Rape, and Kick questionnaire    Fear of Current or Ex-Partner: No    Emotionally Abused: No    Physically Abused: No    Sexually Abused: No    FAMILY HISTORY: Family History  Problem Relation Age of Onset   Cancer Sister  67       ovarian   Depression Sister    Cancer Brother        CLL   Cancer Father 15       colon   Heart disease Father    Heart attack Father 19       died from MI   Hyperlipidemia Father    Hypertension Father    Cancer Mother        leukemia   Breast cancer Paternal Aunt        40's   Migraines Neg Hx     ALLERGIES:  is allergic to prednisone, amiodarone, hydrocodone-acetaminophen, oxycodone, tapentadol, tramadol, adhesive [tape], and latex.  MEDICATIONS:  Current Outpatient Medications  Medication Sig Dispense Refill   acetaminophen (TYLENOL) 500 MG tablet Take 500-1,000 mg by mouth every 6 (six) hours as needed for mild pain or moderate pain.     ALPRAZolam (XANAX) 0.25 MG tablet Take 1 tablet (0.25 mg total) by mouth 2 (two) times daily as needed for anxiety. 60 tablet 2   apixaban (ELIQUIS) 5 MG TABS tablet TAKE 1 TABLET BY MOUTH TWICE A DAY 180 tablet 1   azelastine (ASTELIN) 0.1 % nasal spray Place 1-2 sprays into both nostrils daily as needed for rhinitis.     bisoprolol (ZEBETA) 5 MG tablet Take 5 mg by mouth in the morning and at bedtime.     busPIRone (BUSPAR) 5 MG tablet TAKE 1 TABLET BY MOUTH THREE TIMES A DAY AS NEEDED 60 tablet 1   Cholecalciferol (VITAMIN D3) 5000 units CAPS Take 5,000 Units by mouth daily.     clotrimazole (LOTRIMIN) 1 % cream Apply 1 application  topically 2 (two) times daily. 30 g 0   diphenhydrAMINE (BENADRYL) 25 mg capsule Take 1 capsule (25 mg total) by mouth every 8 (eight) hours as needed for itching or allergies. 30 capsule 0   esomeprazole (NEXIUM) 20 MG capsule Take 20 mg by mouth daily as needed (GERD symptoms).     fluconazole (DIFLUCAN) 150 MG tablet Take 1 tablet (150 mg total) by mouth once a week. X 2-4 weeks 4 tablet 0   FLUoxetine (PROZAC) 20 MG capsule Take 1 capsule (20 mg total) by mouth every morning. 90 capsule 3   furosemide (LASIX) 20 MG tablet TAKE 1 TABLET BY MOUTH TWICE A DAY 180 tablet 0   gabapentin (NEURONTIN) 100  MG capsule Take 1 capsule (100 mg total) by mouth 3 (three) times daily. 270 capsule 0   hydrocortisone 2.5 % ointment Apply topically 2 (two) times daily. prn 60 g 2   KLOR-CON M20 20 MEQ tablet TAKE 1 TABLET BY MOUTH EVERY DAY 90 tablet 1   lidocaine-prilocaine (EMLA) cream Apply to affected area once 30 g 3   loperamide (IMODIUM) 2 MG capsule Take 1 capsule (2 mg total) by mouth See admin instructions. Initial: 4 mg, followed by 2 mg after each loose stool; maximum: 16 mg/day 60 capsule 1   meclizine (ANTIVERT) 12.5 MG tablet Take 1 tablet (12.5 mg total) by mouth 3 (three) times daily as needed for dizziness. 30 tablet 0   Multiple Vitamin (MULTIVITAMIN) tablet Take 1 tablet by mouth daily.     nystatin (MYCOSTATIN/NYSTOP) powder Apply 1 application. topically 3 (three) times daily. 60 g 11   nystatin cream (MYCOSTATIN) Apply 1 application. topically 2 (two) times daily. Prn abdomen 60 g 5   ondansetron (ZOFRAN) 8 MG tablet Take 1 tablet (8 mg total) by mouth every 8 (eight) hours as needed for nausea or vomiting. 60 tablet 0   prochlorperazine (COMPAZINE) 10 MG tablet Take 1 tablet (10 mg total) by mouth every 6 (six) hours as needed (Nausea or vomiting). 30 tablet 1   promethazine (PHENERGAN) 25 MG tablet Take 1 tablet (25 mg total) by mouth every 6 (six) hours as needed for nausea or vomiting. 60 tablet 0   ranolazine (RANEXA) 500 MG 12 hr tablet TAKE 1 TABLET BY MOUTH TWICE A DAY 180 tablet 1   rizatriptan (MAXALT) 10 MG tablet TAKE 1 TABLET (10 MG TOTAL) BY MOUTH AS NEEDED FOR MIGRAINE. MAY REPEAT IN 2 HOURS IF NEEDED 10 tablet 0   rOPINIRole (REQUIP) 2 MG tablet TAKE 1 TABLET BY MOUTH AT BEDTIME. 90 tablet 1   rosuvastatin (CRESTOR) 20 MG tablet TAKE 1 TABLET BY MOUTH EVERY DAY 90 tablet 0   silver sulfADIAZINE (SILVADENE) 1 % cream Apply 1 application. topically 2 (two) times daily. 50 g 3   No current facility-administered medications for this visit.     PHYSICAL EXAMINATION: ECOG  PERFORMANCE STATUS: 2 - Symptomatic, <50% confined to bed Vitals:   01/19/22 1012  BP: (!) 152/91  Pulse: (!) 55  Resp: 18  Temp: (!) 97.1 F (36.2 C)    Filed Weights   01/19/22 1012  Weight: 259 lb 14.4 oz (117.9 kg)     Physical Exam Constitutional:      General: She is not in acute distress.    Appearance: She is obese.  HENT:     Head: Normocephalic and atraumatic.  Eyes:     General: No scleral icterus. Cardiovascular:     Rate and Rhythm:  Normal rate and regular rhythm.     Heart sounds: Normal heart sounds.  Pulmonary:     Effort: Pulmonary effort is normal. No respiratory distress.     Breath sounds: No wheezing.  Abdominal:     General: Bowel sounds are normal. There is no distension.     Palpations: Abdomen is soft.  Musculoskeletal:        General: No deformity. Normal range of motion.     Cervical back: Normal range of motion and neck supple.     Comments: 1+ edema bilaterally in lower extremity Left outer thing tenderness, no discrete large mass, a few palpable tender subcutaneous nodules.    Skin:    General: Skin is warm and dry.     Findings: No erythema or rash.  Neurological:     Mental Status: She is alert and oriented to person, place, and time. Mental status is at baseline.     Cranial Nerves: No cranial nerve deficit.     Coordination: Coordination normal.  Psychiatric:        Mood and Affect: Mood normal.     . LABORATORY DATA:  I have reviewed the data as listed Lab Results  Component Value Date   WBC 4.5 01/19/2022   HGB 12.3 01/19/2022   HCT 36.3 01/19/2022   MCV 93.3 01/19/2022   PLT 146 (L) 01/19/2022   Recent Labs    07/02/21 1256 07/03/21 0711 12/08/21 1248 12/29/21 1244 01/19/22 0948  NA 137   < > 141 139 136  K 3.7   < > 3.7 3.8 3.9  CL 108   < > 104 104 103  CO2 24   < > 29 27 26   GLUCOSE 104*   < > 124* 108* 107*  BUN 19   < > 15 14 15   CREATININE 0.77   < > 0.79 0.75 0.89  CALCIUM 8.7*   < > 8.7* 8.4* 8.4*   GFRNONAA >60   < > >60 >60 >60  PROT 6.8   < > 6.7 6.6 6.7  ALBUMIN 3.8   < > 3.6 3.7 3.7  AST 20   < > 21 22 20   ALT 15   < > 15 17 14   ALKPHOS 59   < > 71 73 75  BILITOT 0.8   < > 0.6 0.5 0.8  BILIDIR 0.1  --   --   --   --   IBILI 0.7  --   --   --   --    < > = values in this interval not displayed.    Iron/TIBC/Ferritin/ %Sat    Component Value Date/Time   IRON 72 03/31/2021 0856   TIBC 364 03/31/2021 0856   FERRITIN 291 03/31/2021 0856   IRONPCTSAT 20 03/31/2021 0856      RADIOGRAPHIC STUDIES: I have personally reviewed the radiological images as listed and agreed with the findings in the report. [place] LT LOWER EXTREM LTD SOFT TISSUE NON VASCULAR  Result Date: 01/03/2022 CLINICAL DATA:  Palpable abnormality left hip EXAM: ULTRASOUND left LOWER EXTREMITY LIMITED TECHNIQUE: Ultrasound examination of the lower extremity soft tissues was performed in the area of clinical concern. COMPARISON:  None Available. FINDINGS: Sonographic examination of soft tissues around the left hip was performed with attention to the area of patient's symptoms. There is no demonstrable discrete fluid collection or irregular soft tissue mass. Echogenicity in the area of patient's symptoms is similar to subcutaneous fat. IMPRESSION: No significant focal sonographic  abnormality is seen in the soft tissues adjacent to the left hip in the area of patient's symptoms. Electronically Signed   By: Elmer Picker M.D.   On: 01/03/2022 15:31   MM DIAG BREAST TOMO BILATERAL  Result Date: 01/03/2022 CLINICAL DATA:  71 year old female status post malignant right lumpectomy in August 2022. EXAM: DIGITAL DIAGNOSTIC BILATERAL MAMMOGRAM WITH TOMOSYNTHESIS AND CAD TECHNIQUE: Bilateral digital diagnostic mammography and breast tomosynthesis was performed. The images were evaluated with computer-aided detection. COMPARISON:  Previous exam(s). ACR Breast Density Category b: There are scattered areas of fibroglandular  density. FINDINGS: Interval post lumpectomy changes are demonstrated in the upper outer right breast at posterior depth. A few linear calcifications posterior to the lumpectomy bed are most suggestive of fat necrosis on the 3D tomosynthesis views. Otherwise, no new or suspicious findings in either breast. IMPRESSION: 1. Probably benign right breast calcifications along the posterior lumpectomy bed felt to represent early changes of fat necrosis. Recommend short-term imaging follow-up. 2. No mammographic evidence of malignancy on the left. RECOMMENDATION: Diagnostic right breast mammogram in 6 months. I have discussed the findings and recommendations with the patient. If applicable, a reminder letter will be sent to the patient regarding the next appointment. BI-RADS CATEGORY  3: Probably benign. Electronically Signed   By: Kristopher Oppenheim M.D.   On: 01/03/2022 14:41

## 2022-01-19 NOTE — Progress Notes (Signed)
Patient tolerated trastuzumab infusion well, no questions/concerns voiced. Patient stable at discharge. AVS given.

## 2022-01-19 NOTE — Assessment & Plan Note (Signed)
#  Right breast invasive carcinoma pT1b pN0 ER negative, PR weakly positive HER 2 positive.  Patient does not tolerate adjuvant chemotherapy with Taxol and trastuzumab despite Taxol dose reduction.-->Status post adjuvant radiation--> maintenance Trastuzumab Labs are reviewed and discussed with patient. Proceed with trastuzumab maintenance Repeat echocardiogram in August 2023.

## 2022-01-19 NOTE — Assessment & Plan Note (Signed)
Follow-up with cardiology.  Patient is on Eliquis 5 mg twice daily.

## 2022-01-19 NOTE — Progress Notes (Signed)
Per MD will adjust dose based on weight.

## 2022-01-19 NOTE — Telephone Encounter (Signed)
LVM for pt with appt info .Marland KitchenKJ

## 2022-01-19 NOTE — Assessment & Plan Note (Signed)
Treatment plan as needed.  

## 2022-01-31 ENCOUNTER — Other Ambulatory Visit: Payer: Self-pay

## 2022-02-02 ENCOUNTER — Ambulatory Visit: Payer: Medicare Other | Admitting: Urology

## 2022-02-02 ENCOUNTER — Other Ambulatory Visit: Payer: Self-pay | Admitting: *Deleted

## 2022-02-02 DIAGNOSIS — N200 Calculus of kidney: Secondary | ICD-10-CM

## 2022-02-03 ENCOUNTER — Ambulatory Visit: Payer: Medicare Other | Admitting: Urology

## 2022-02-06 DIAGNOSIS — M1712 Unilateral primary osteoarthritis, left knee: Secondary | ICD-10-CM | POA: Diagnosis not present

## 2022-02-06 DIAGNOSIS — I509 Heart failure, unspecified: Secondary | ICD-10-CM | POA: Diagnosis not present

## 2022-02-06 DIAGNOSIS — C50911 Malignant neoplasm of unspecified site of right female breast: Secondary | ICD-10-CM | POA: Diagnosis not present

## 2022-02-06 DIAGNOSIS — J449 Chronic obstructive pulmonary disease, unspecified: Secondary | ICD-10-CM | POA: Diagnosis not present

## 2022-02-06 DIAGNOSIS — G629 Polyneuropathy, unspecified: Secondary | ICD-10-CM | POA: Diagnosis not present

## 2022-02-07 ENCOUNTER — Ambulatory Visit: Payer: Medicare Other | Admitting: Family

## 2022-02-07 ENCOUNTER — Encounter: Payer: Self-pay | Admitting: Family

## 2022-02-07 DIAGNOSIS — M7989 Other specified soft tissue disorders: Secondary | ICD-10-CM | POA: Diagnosis not present

## 2022-02-07 DIAGNOSIS — Z8639 Personal history of other endocrine, nutritional and metabolic disease: Secondary | ICD-10-CM

## 2022-02-07 DIAGNOSIS — F32A Depression, unspecified: Secondary | ICD-10-CM | POA: Diagnosis not present

## 2022-02-07 DIAGNOSIS — R42 Dizziness and giddiness: Secondary | ICD-10-CM

## 2022-02-07 DIAGNOSIS — F419 Anxiety disorder, unspecified: Secondary | ICD-10-CM

## 2022-02-07 DIAGNOSIS — I10 Essential (primary) hypertension: Secondary | ICD-10-CM

## 2022-02-07 DIAGNOSIS — Z6841 Body Mass Index (BMI) 40.0 and over, adult: Secondary | ICD-10-CM

## 2022-02-07 LAB — POCT GLYCOSYLATED HEMOGLOBIN (HGB A1C): Hemoglobin A1C: 4.5 % (ref 4.0–5.6)

## 2022-02-07 MED ORDER — WEGOVY 0.25 MG/0.5ML ~~LOC~~ SOAJ: 0.2500 mg | SUBCUTANEOUS | 2 refills | Status: DC

## 2022-02-07 NOTE — Assessment & Plan Note (Signed)
Chronic, stable.  Continue Prozac 20 mg, BuSpar 5 mg 2 times daily, Xanax 0.25 mg twice daily as needed.  

## 2022-02-07 NOTE — Patient Instructions (Addendum)
I have ordered an ultrasound of your left upper back to further evaluate the soft tissue mass   Let us know if you dont hear back within a week in regards to an appointment being scheduled.    we have discussed starting non insulin daily injectable medication called Wegovy  which is a glucagon like peptide (GLP 1) agonist and works by delaying gastric emptying and increasing insulin secretion.It is given once per week. Most patients see significant weight loss with this drug class.   You may NOT take either medication if you or your family has history of thyroid, parathyroid, OR adrenal cancer. Please confirm you and your family does NOT have this history as this drug class has black box warning on this medication for that reason.   Advise to follow with directions on prescription and slowly increase from 0.'25mg'$  [place] once per week ;stay here for 4 weeks. You may then increase to 0.'5mg'$  [place] once per week and stay there for 4 weeks.  We can slowly titrate further at follow up with goal of no more than 1-2 lbs weight loss per week.  This is  Dr. Lupita Dawn  example of a  "Low GI"  Diet:  It will allow you to lose 4 to 8  lbs  per month if you follow it carefully.  Your goal with exercise is a minimum of 30 minutes of aerobic exercise 5 days per week (Walking does not count once it becomes easy!)    All of the foods can be found at grocery stores and in bulk at [employer].  The Atkins protein bars and shakes are available in more varieties at [employer], [employer] and [employer].     7 AM Breakfast:  Choose from the following:  Low carbohydrate Protein  Shakes (I recommend the  Premier Protein chocolate shakes,  EAS AdvantEdge "Carb Control" shakes  Or the Atkins shakes all are under 3 net carbs)     a scrambled egg/bacon/cheese burrito made with Mission's "carb balance" whole wheat tortilla  (about 10 net carbs )  Regulatory affairs officer (basically a quiche without the pastry crust) that is  eaten cold and very convenient way to get your eggs.  8 carbs)  If you make your own protein shakes, avoid bananas and pineapple,  And use low carb greek yogurt or original /unsweetened almond or soy milk    Avoid cereal and bananas, oatmeal and cream of wheat and grits. They are loaded with carbohydrates!   10 AM: high protein snack:  Protein bar by Atkins (the snack size, under 200 cal, usually < 6 net carbs).    A stick of cheese:  Around 1 carb,  100 cal     Dannon Light n Fit [place] Yogurt  (80 cal, 8 carbs)  Other so called "protein bars" and Greek yogurts tend to be loaded with carbohydrates.  Remember, in food advertising, the word "energy" is synonymous for " carbohydrate."  Lunch:   A Sandwich using the bread choices listed, Can use any  Eggs,  lunchmeat, grilled meat or canned tuna), avocado, regular mayo/mustard  and cheese.  A Salad using blue cheese, ranch,  Goddess or vinagrette,  Avoid taco shells, croutons or "confetti" and no "candied nuts" but regular nuts OK.   No pretzels, nabs  or chips.  Pickles and miniature sweet peppers are a good low carb alternative that provide a "crunch"  The bread is the only source of carbohydrate in a  sandwich and  can be decreased by trying some of the attached alternatives to traditional loaf bread   Avoid "Low fat dressings, as well as [place] dressings They are loaded with sugar!   3 PM/ Mid day  Snack:  Consider  1 ounce of  almonds, walnuts, pistachios, pecans, peanuts,  Macadamia nuts or a nut medley.  Avoid "granola and granola bars "  Mixed nuts are ok in moderation as long as there are no raisins,  cranberries or dried fruit.   KIND bars are OK if you get the low glycemic index variety   Try the prosciutto/mozzarella cheese sticks by [employer]  In deli /backery section   High protein      6 PM  Dinner:     Meat/fowl/fish with a green salad, and either broccoli, cauliflower, green beans, spinach, brussel  sprouts or  Lima beans. DO NOT BREAD THE PROTEIN!!      There is a low carb pasta by Dreamfield's that is acceptable and tastes great: only 5 digestible carbs/serving.( All grocery stores but BJs carry it ) Several ready made meals are available low carb:   Try Michel Angelo's chicken piccata or chicken or eggplant parm over low carb pasta.(Lowes and BJs)   Marjory Lies Sanchez's "Carnitas" (pulled pork, no sauce,  0 carbs) or his beef pot roast to make a dinner burrito (at [employer]J's)  Pesto over low carb pasta (bj's sells a good quality pesto in the center refrigerated section of the deli   Try satueeing  Cheral Marker with mushroooms as a good side   [employer] Giant makes a mashed cauliflower that tastes like mashed potatoes  Whole wheat pasta is still full of digestible carbs and  Not as low in glycemic index as Dreamfield's.   Brown rice is still rice,  So skip the rice and noodles if you eat [place] or [place] (or at least limit to 1/2 cup)  9 PM snack :   Breyer's "low carb" fudgsicle or  ice cream bar (Carb Smart line), or  Weight Watcher's ice cream bar , or another "no sugar added" ice cream;  a serving of fresh berries/cherries with whipped cream   Cheese or DANNON'S LlGHT N FIT GREEK YOGURT  8 ounces of [place] unsweetened almond/cococunut milk    Treat yourself to a parfait made with whipped cream blueberiies, walnuts and vanilla greek yogurt  Avoid bananas, pineapple, grapes  and watermelon on a regular basis because they are high in sugar.  THINK OF THEM AS DESSERT  Remember that snack Substitutions should be less than 10 NET carbs per serving and meals < 20 carbs. Remember to subtract fiber grams to get the "net carbs."

## 2022-02-07 NOTE — Assessment & Plan Note (Signed)
Completely resolved at this time.  Patient no longer requires meclizine.  She will let me know if symptom were to recur.

## 2022-02-07 NOTE — Progress Notes (Signed)
Subjective:    Patient ID: Amber Barnes, female    DOB: 1950/11/28, 71 y.o.   MRN: [id number]  CC: Amber Barnes is a 71 y.o. female who presents today for an acute visit and follow up    HPI: She is interested in weight loss medication.  She is intermittent fasting and eating less portions. She is exercising daily.     She also complains of 'knots' posterior neck. She has felt these weeks ago. Nontender.   Previously seen by dr [name] for left buttocks adiposity whom declined surgery.   No associated trouble swallowing, CP, sob, left arm numbness.   Vertigo has resolved at this time. She is no longer using meclizine.    No personal or family h/o thyroid cancer, MEN. She has noncancerous history of thyroidectomy.    Anxiety and depression-compliant with Prozac 20 mg, BuSpar 5 mg 2 times daily, Xanax 0.25 mg twice daily as needed. She feels anxiety has improved .  Right breast cancer, following with Dr. Tasia Catchings undergoing chemotherapy  HISTORY:  Past Medical History:  Diagnosis Date   Achilles tendinitis    Acute medial meniscal tear    Allergy    Anxiety    Atrial fibrillation ([place])    a. new onset 07/2016; b. CHADS2VASc --> 4 (HTN, female, age, CHF); c. eliquis   CHF (congestive heart failure) (HCC)    Chronic anticoagulation    Apixaban   Chronic lower back pain    Colon polyp    Complication of anesthesia    Emergence delirium; hypoxia   COPD (chronic obstructive pulmonary disease) ([place])    Cystic breast    Depression    Endometriosis    Fibrocystic breast disease    GERD (gastroesophageal reflux disease)    History of stress test    a. 07/2016 MV: EF 55-65%. Small, mild defect  in mid anteroseptal, apical septal, and apical locations. No ischemia-->low risk.   Hyperlipidemia    Hypertension    Insomnia    Lipoma    Malignant neoplasm of upper-outer quadrant of right female breast (HCC)    Stage 1A invasive mammary carcinoma (cT1b or T1c N0); ER (-), PR  (+), HER2/neu (+)   Migraine    Mild Mitral regurgitation    a. 07/2016 Echo: EF 55-65%, no rwma, mild MR. Mildly dil LA. Nl RV fx.    Nephrolithiasis    OSA (obstructive sleep apnea)    non-compliant with nocturnal PAP therapy   Osteoarthritis    left knee   Osteopenia    Peptic ulcer disease    a. H. pylori   Pneumonia    RLS (restless legs syndrome)    Tendonitis    Thyroid disease    Vitamin D deficiency    Past Surgical History:  Procedure Laterality Date   ABDOMINAL HYSTERECTOMY     ovaries left   ACHILLES TENDON SURGERY     2012,01/2017   BACK SURGERY     fusion lumbar   BREAST BIOPSY Right 01/15/2021   Affirm biopsy-"X" clip-INVASIVE MAMMARY CARCINOMA   BREAST BIOPSY Right 01/15/2021   u/s bx-"venus" clip INVASIVE MAMMARY CARCINOMA   BREAST BIOPSY     BREAST LUMPECTOMY,RADIO FREQ LOCALIZER,AXILLARY SENTINEL LYMPH NODE BIOPSY Right 02/17/2021   Procedure: BREAST [place] LYMPH NODE BIOPSY;  Surgeon: Ronny Bacon, MD;  Location: [facility];  Service: General;  Laterality: Right;   CARDIOVERSION N/A 09/07/2016   Procedure: CARDIOVERSION;  Surgeon: Minna Merritts, MD;  Location:  [facility];  Service: Cardiovascular;  Laterality: N/A;   CARDIOVERSION N/A 10/19/2016   Procedure: CARDIOVERSION;  Surgeon: Minna Merritts, MD;  Location: [facility];  Service: Cardiovascular;  Laterality: N/A;   CARDIOVERSION N/A 12/14/2016   Procedure: CARDIOVERSION;  Surgeon: Minna Merritts, MD;  Location: [facility];  Service: Cardiovascular;  Laterality: N/A;   COLONOSCOPY     COLONOSCOPY WITH PROPOFOL N/A 05/13/2020   Procedure: COLONOSCOPY WITH PROPOFOL;  Surgeon: Toledo, Benay Pike, MD;  Location: [facility];  Service: Gastroenterology;  Laterality: N/A;  ELIQUIS   CYSTOSCOPY WITH STENT PLACEMENT Bilateral 07/12/2021   Procedure: CYSTOSCOPY WITH STENT PLACEMENT;  Surgeon: Billey Co, MD;  Location: [facility];  Service: Urology;   Laterality: Bilateral;   CYSTOSCOPY/URETEROSCOPY/HOLMIUM LASER/STENT PLACEMENT Bilateral 07/30/2021   Procedure: CYSTOSCOPY/URETEROSCOPY/HOLMIUM LASER/BILATERAL STENT REMOVAL;  Surgeon: Billey Co, MD;  Location: [facility];  Service: Urology;  Laterality: Bilateral;   ESOPHAGOGASTRODUODENOSCOPY     ESOPHAGOGASTRODUODENOSCOPY (EGD) WITH PROPOFOL N/A 05/13/2020   Procedure: ESOPHAGOGASTRODUODENOSCOPY (EGD) WITH PROPOFOL;  Surgeon: Toledo, Benay Pike, MD;  Location: [facility];  Service: Gastroenterology;  Laterality: N/A;   FOOT SURGERY Left    tendon   JOINT REPLACEMENT Right    hip   PORTA CATH INSERTION N/A 03/23/2021   Procedure: PORTA CATH INSERTION;  Surgeon: Katha Cabal, MD;  Location: [facility];  Service: Cardiovascular;  Laterality: N/A;   PORTACATH PLACEMENT N/A 03/23/2021   Procedure: ATTEMPTED INSERTION PORT-A-CATH;  Surgeon: Ronny Bacon, MD;  Location: [facility];  Service: General;  Laterality: N/A;   THYROIDECTOMY     thyroid lobectomy secondary to a benign nodule   TOTAL HIP ARTHROPLASTY Right 08/21/2018   Procedure: TOTAL HIP ARTHROPLASTY ANTERIOR APPROACH-RIGHT CELL SAVER REQUESTED;  Surgeon: [name], MD;  Location: [facility];  Service: Orthopedics;  Laterality: Right;   Family History  Problem Relation Age of Onset   Cancer Mother        leukemia   Cancer Father 45       colon   Heart disease Father    Heart attack Father 88       died from MI   Hyperlipidemia Father    Hypertension Father    Cancer Sister 83       ovarian   Depression Sister    Cancer Brother        CLL   Breast cancer Paternal Aunt        2's   Migraines Neg Hx    Thyroid cancer Neg Hx     Allergies: Prednisone, Amiodarone, Hydrocodone-acetaminophen, Oxycodone, Tapentadol, Tramadol, Adhesive [tape], and Latex Current Outpatient Medications on File Prior to Visit  Medication Sig Dispense Refill   acetaminophen (TYLENOL) 500 MG tablet Take 500-1,000 mg by  mouth every 6 (six) hours as needed for mild pain or moderate pain.     ALPRAZolam (XANAX) 0.25 MG tablet Take 1 tablet (0.25 mg total) by mouth 2 (two) times daily as needed for anxiety. 60 tablet 2   apixaban (ELIQUIS) 5 MG TABS tablet TAKE 1 TABLET BY MOUTH TWICE A DAY 180 tablet 1   azelastine (ASTELIN) 0.1 % nasal spray Place 1-2 sprays into both nostrils daily as needed for rhinitis.     bisoprolol (ZEBETA) 5 MG tablet Take 5 mg by mouth in the morning and at bedtime.     busPIRone (BUSPAR) 5 MG tablet TAKE 1 TABLET BY MOUTH THREE TIMES A DAY AS NEEDED 60 tablet 1   Cholecalciferol (  VITAMIN D3) 5000 units CAPS Take 5,000 Units by mouth daily.     clotrimazole (LOTRIMIN) 1 % cream Apply 1 application topically 2 (two) times daily. 30 g 0   diphenhydrAMINE (BENADRYL) 25 mg capsule Take 1 capsule (25 mg total) by mouth every 8 (eight) hours as needed for itching or allergies. 30 capsule 0   esomeprazole (NEXIUM) 20 MG capsule Take 20 mg by mouth daily as needed (GERD symptoms).     fluconazole (DIFLUCAN) 150 MG tablet Take 1 tablet (150 mg total) by mouth once a week. X 2-4 weeks 4 tablet 0   FLUoxetine (PROZAC) 20 MG capsule Take 1 capsule (20 mg total) by mouth every morning. 90 capsule 3   furosemide (LASIX) 20 MG tablet TAKE 1 TABLET BY MOUTH TWICE A DAY 180 tablet 0   gabapentin (NEURONTIN) 100 MG capsule Take 1 capsule (100 mg total) by mouth 3 (three) times daily. 270 capsule 0   hydrocortisone 2.5 % ointment Apply topically 2 (two) times daily. prn 60 g 2   KLOR-CON M20 20 MEQ tablet TAKE 1 TABLET BY MOUTH EVERY DAY 90 tablet 1   lidocaine-prilocaine (EMLA) cream Apply to affected area once 30 g 3   loperamide (IMODIUM) 2 MG capsule Take 1 capsule (2 mg total) by mouth See admin instructions. Initial: 4 mg, followed by 2 mg after each loose stool; maximum: 16 mg/day 60 capsule 1   meclizine (ANTIVERT) 12.5 MG tablet Take 1 tablet (12.5 mg total) by mouth 3 (three) times daily as needed  for dizziness. 30 tablet 0   Multiple Vitamin (MULTIVITAMIN) tablet Take 1 tablet by mouth daily.     nystatin (MYCOSTATIN/NYSTOP) powder Apply 1 application. topically 3 (three) times daily. 60 g 11   nystatin cream (MYCOSTATIN) Apply 1 application. topically 2 (two) times daily. Prn abdomen 60 g 5   ondansetron (ZOFRAN) 8 MG tablet Take 1 tablet (8 mg total) by mouth every 8 (eight) hours as needed for nausea or vomiting. 60 tablet 0   prochlorperazine (COMPAZINE) 10 MG tablet Take 1 tablet (10 mg total) by mouth every 6 (six) hours as needed (Nausea or vomiting). 30 tablet 1   promethazine (PHENERGAN) 25 MG tablet Take 1 tablet (25 mg total) by mouth every 6 (six) hours as needed for nausea or vomiting. 60 tablet 0   ranolazine (RANEXA) 500 MG 12 hr tablet TAKE 1 TABLET BY MOUTH TWICE A DAY 180 tablet 1   rizatriptan (MAXALT) 10 MG tablet TAKE 1 TABLET (10 MG TOTAL) BY MOUTH AS NEEDED FOR MIGRAINE. MAY REPEAT IN 2 HOURS IF NEEDED 10 tablet 0   rOPINIRole (REQUIP) 2 MG tablet TAKE 1 TABLET BY MOUTH AT BEDTIME. 90 tablet 1   rosuvastatin (CRESTOR) 20 MG tablet TAKE 1 TABLET BY MOUTH EVERY DAY 90 tablet 0   silver sulfADIAZINE (SILVADENE) 1 % cream Apply 1 application. topically 2 (two) times daily. 50 g 3   No current facility-administered medications on file prior to visit.    Social History   Tobacco Use   Smoking status: Former    Packs/day: 0.25    Years: 10.00    Total pack years: 2.50    Types: Cigarettes    Quit date: 2005    Years since quitting: 18.5   Smokeless tobacco: Never   Tobacco comments:    smoked for 10 years, 1 pack every 2-3 days  Vaping Use   Vaping Use: Never used  Substance Use Topics   Alcohol  use: Not Currently    Alcohol/week: 1.0 standard drink of alcohol    Types: 1 Glasses of wine per week    Comment: rarely   Drug use: No    Review of Systems  Constitutional:  Negative for chills and fever.  HENT:  Negative for trouble swallowing.    Respiratory:  Negative for cough.   Cardiovascular:  Negative for chest pain and palpitations.  Gastrointestinal:  Negative for nausea and vomiting.  Skin:  Negative for rash.      Objective:    BP 126/82 (BP Location: Left Arm, Patient Position: Sitting, Cuff Size: Normal)   Pulse 88   Temp 97.9 F (36.6 C) (Oral)   Ht _0  (1.651 m)   Wt 259 lb 9.6 oz (117.8 kg)   SpO2 96%   BMI 43.20 kg/m    Physical Exam Vitals reviewed.  Constitutional:      Appearance: She is well-developed.  Eyes:     Conjunctiva/sclera: Conjunctivae normal.  Neck:     Thyroid: No thyroid mass, thyromegaly or thyroid tenderness.  Cardiovascular:     Rate and Rhythm: Normal rate and regular rhythm.     Pulses: Normal pulses.     Heart sounds: Normal heart sounds.  Pulmonary:     Effort: Pulmonary effort is normal.     Breath sounds: Normal breath sounds. No wheezing, rhonchi or rales.  Skin:    General: Skin is warm and dry.          Comments: 2 well-circumscribed approximately 2 to 3 cm masses felt left scapular area.  Nontender.  No erythema.  Area is nonfluctuant.  No red streaks or increased warmth  Neurological:     Mental Status: She is alert.  Psychiatric:        Speech: Speech normal.        Behavior: Behavior normal.        Thought Content: Thought content normal.        Assessment & Plan:   Problem List Items Addressed This Visit       Cardiovascular and Mediastinum   Hypertension   Relevant Orders   POCT HgB A1C (Completed)     Other   Anxiety and depression    Chronic, stable.  Continue Prozac 20 mg, BuSpar 5 mg 2 times daily, Xanax 0.25 mg twice daily as needed      Obesity - Primary    Congratulated patient on diligence to lifestyle changes.  Counseled on low glycemic diet.  She prefers to start medication ad we discussed starting Wegovy.  She has no history of prediabetes.  Counseled on side effects including black box warning as it relates to medullary thyroid  cancer, multiple endocrine neoplasia and administration of medication.  Close follow-up      Relevant Medications   Semaglutide-Weight Management (WEGOVY) 0.25 MG/0.5ML SOAJ   Soft tissue mass    Presentation consistent with lipoma.  Pending miscellaneous ultrasound to further evaluate      Relevant Orders   [place] MiscellaneoUS Localization   Vertigo    Completely resolved at this time.  Patient no longer requires meclizine.  She will let me know if symptom were to recur.           I am having Amber Furl "[name]" start on Wegovy. I am also having her maintain her multivitamin, Vitamin D3, rizatriptan, acetaminophen, esomeprazole, azelastine, lidocaine-prilocaine, prochlorperazine, ondansetron, promethazine, loperamide, diphenhydrAMINE, clotrimazole, bisoprolol, furosemide, silver sulfADIAZINE, fluconazole, nystatin cream, nystatin, hydrocortisone, ranolazine, [place]-Con  M20, gabapentin, busPIRone, ALPRAZolam, FLUoxetine, meclizine, rOPINIRole, rosuvastatin, and Eliquis.   Meds ordered this encounter  Medications   Semaglutide-Weight Management (WEGOVY) 0.25 MG/0.5ML SOAJ    Sig: Inject 0.25 mg into the skin once a week. After 4 weeks, increase to 0.52m [place] qwk    Dispense:  2 mL    Refill:  2    Order Specific Question:   Supervising Provider    Answer:   TCrecencio Mc[[id number]]    Return precautions given.   Risks, benefits, and alternatives of the medications and treatment plan prescribed today were discussed, and patient expressed understanding.   Education regarding symptom management and diagnosis given to patient on AVS.  Continue to follow with ABurnard Hawthorne FNP for routine health maintenance.   JDocia Furland I agreed with plan.   MMable Paris FNP

## 2022-02-07 NOTE — Assessment & Plan Note (Addendum)
Congratulated patient on diligence to lifestyle changes.  Counseled on low glycemic diet.  She prefers to start medication ad we discussed starting Wegovy.  She has no history of prediabetes.  Counseled on side effects including black box warning as it relates to medullary thyroid cancer, multiple endocrine neoplasia and administration of medication.  Close follow-up

## 2022-02-07 NOTE — Assessment & Plan Note (Signed)
Presentation consistent with lipoma.  Pending miscellaneous ultrasound to further evaluate

## 2022-02-08 ENCOUNTER — Other Ambulatory Visit: Payer: Self-pay

## 2022-02-09 ENCOUNTER — Inpatient Hospital Stay: Payer: Medicare Other

## 2022-02-09 ENCOUNTER — Encounter: Payer: Self-pay | Admitting: Oncology

## 2022-02-09 ENCOUNTER — Inpatient Hospital Stay: Payer: Medicare Other | Attending: Oncology

## 2022-02-09 ENCOUNTER — Inpatient Hospital Stay (HOSPITAL_BASED_OUTPATIENT_CLINIC_OR_DEPARTMENT_OTHER): Payer: Medicare Other | Admitting: Oncology

## 2022-02-09 VITALS — BP 156/83 | HR 52

## 2022-02-09 DIAGNOSIS — R42 Dizziness and giddiness: Secondary | ICD-10-CM | POA: Diagnosis not present

## 2022-02-09 DIAGNOSIS — C50411 Malignant neoplasm of upper-outer quadrant of right female breast: Secondary | ICD-10-CM

## 2022-02-09 DIAGNOSIS — M7989 Other specified soft tissue disorders: Secondary | ICD-10-CM

## 2022-02-09 DIAGNOSIS — Z5112 Encounter for antineoplastic immunotherapy: Secondary | ICD-10-CM

## 2022-02-09 DIAGNOSIS — G629 Polyneuropathy, unspecified: Secondary | ICD-10-CM | POA: Diagnosis not present

## 2022-02-09 DIAGNOSIS — I4891 Unspecified atrial fibrillation: Secondary | ICD-10-CM | POA: Diagnosis not present

## 2022-02-09 DIAGNOSIS — Z171 Estrogen receptor negative status [ER-]: Secondary | ICD-10-CM | POA: Diagnosis not present

## 2022-02-09 DIAGNOSIS — C50919 Malignant neoplasm of unspecified site of unspecified female breast: Secondary | ICD-10-CM

## 2022-02-09 LAB — CBC WITH DIFFERENTIAL/PLATELET
Abs Immature Granulocytes: 0.01 10*3/uL (ref 0.00–0.07)
Basophils Absolute: 0 10*3/uL (ref 0.0–0.1)
Basophils Relative: 0 %
Eosinophils Absolute: 0.1 10*3/uL (ref 0.0–0.5)
Eosinophils Relative: 1 %
HCT: 37 % (ref 36.0–46.0)
Hemoglobin: 12.4 g/dL (ref 12.0–15.0)
Immature Granulocytes: 0 %
Lymphocytes Relative: 27 %
Lymphs Abs: 1.3 10*3/uL (ref 0.7–4.0)
MCH: 31.5 pg (ref 26.0–34.0)
MCHC: 33.5 g/dL (ref 30.0–36.0)
MCV: 93.9 fL (ref 80.0–100.0)
Monocytes Absolute: 0.3 10*3/uL (ref 0.1–1.0)
Monocytes Relative: 6 %
Neutro Abs: 3 10*3/uL (ref 1.7–7.7)
Neutrophils Relative %: 66 %
Platelets: 152 10*3/uL (ref 150–400)
RBC: 3.94 MIL/uL (ref 3.87–5.11)
RDW: 13.3 % (ref 11.5–15.5)
WBC: 4.7 10*3/uL (ref 4.0–10.5)
nRBC: 0 % (ref 0.0–0.2)

## 2022-02-09 LAB — COMPREHENSIVE METABOLIC PANEL
ALT: 17 U/L (ref 0–44)
AST: 24 U/L (ref 15–41)
Albumin: 4.1 g/dL (ref 3.5–5.0)
Alkaline Phosphatase: 77 U/L (ref 38–126)
Anion gap: 9 (ref 5–15)
BUN: 19 mg/dL (ref 8–23)
CO2: 28 mmol/L (ref 22–32)
Calcium: 8.9 mg/dL (ref 8.9–10.3)
Chloride: 102 mmol/L (ref 98–111)
Creatinine, Ser: 1 mg/dL (ref 0.44–1.00)
GFR, Estimated: >60 mL/min (ref >60)
Glucose, Bld: 97 mg/dL (ref 70–99)
Potassium: 3.8 mmol/L (ref 3.5–5.1)
Sodium: 139 mmol/L (ref 135–145)
Total Bilirubin: 0.7 mg/dL (ref 0.3–1.2)
Total Protein: 7.1 g/dL (ref 6.5–8.1)

## 2022-02-09 MED ORDER — DIPHENHYDRAMINE HCL 25 MG PO CAPS
50.0000 mg | ORAL_CAPSULE | Freq: Once | ORAL | Status: AC
  Administered 2022-02-09: 50 mg via ORAL
  Filled 2022-02-09: qty 2

## 2022-02-09 MED ORDER — SODIUM CHLORIDE 0.9 % IV SOLN
Freq: Once | INTRAVENOUS | Status: AC
  Filled 2022-02-09: qty 250

## 2022-02-09 MED ORDER — TRASTUZUMAB-ANNS CHEMO 150 MG IV SOLR: 6.0000 mg/kg | Freq: Once | INTRAVENOUS | Status: DC

## 2022-02-09 MED ORDER — HEPARIN SOD (PORK) LOCK FLUSH 100 UNIT/ML IV SOLN
500.0000 [IU] | Freq: Once | INTRAVENOUS | Status: AC | PRN
  Administered 2022-02-09: 500 [IU]
  Filled 2022-02-09: qty 5

## 2022-02-09 MED ORDER — TRASTUZUMAB-ANNS CHEMO 150 MG IV SOLR
6.0000 mg/kg | Freq: Once | INTRAVENOUS | Status: AC
  Administered 2022-02-09: 714 mg via INTRAVENOUS
  Filled 2022-02-09: qty 34

## 2022-02-09 MED ORDER — ACETAMINOPHEN 325 MG PO TABS
650.0000 mg | ORAL_TABLET | Freq: Once | ORAL | Status: AC
  Administered 2022-02-09: 650 mg via ORAL
  Filled 2022-02-09: qty 2

## 2022-02-09 NOTE — Assessment & Plan Note (Signed)
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  She may increase dose of 200 mg at bedtime.  

## 2022-02-09 NOTE — Assessment & Plan Note (Signed)
Follow-up with cardiology.  Patient is on Eliquis 5 mg twice daily.

## 2022-02-09 NOTE — Assessment & Plan Note (Signed)
PCP has ordered ultrasound for further evaluation.

## 2022-02-09 NOTE — Assessment & Plan Note (Signed)
Treatment plan as needed.  

## 2022-02-09 NOTE — Progress Notes (Signed)
Hematology/Oncology Progress note Telephone:(336) [phone] Fax:(336) [phone]      ASSESSMENT & PLAN:   Cancer Staging  Malignant neoplasm of upper-outer quadrant of right female breast [facility]) Staging form: Breast, AJCC 8th Edition - Pathologic stage from 03/03/2021: Stage IA (pT1b, pN0, cM0, G3, ER-, PR+, HER2+) - Signed by [name], MD on 03/03/2021   Malignant neoplasm of upper-outer quadrant of right female breast [facility]) #Right breast invasive carcinoma pT1b pN0 ER negative, PR weakly positive HER 2 positive.  Patient does not tolerate adjuvant chemotherapy with Taxol and trastuzumab despite Taxol dose reduction.-->Status post adjuvant radiation--> maintenance Trastuzumab Labs are reviewed and discussed with patient. Proceed with trastuzumab maintenance Repeat echocardiogram in August 2023.  right diagnostic mammogram in December 2023  Neuropathy Pre-existing neuropathy in her fingertips bilaterally- secondary to cervical spine radiculopathy continue 100 mg 3 times daily.  She may increase dose of 200 mg at bedtime.   Encounter for monoclonal antibody treatment for malignancy Treatment plan as needed.   Atrial fibrillation [facility]) Follow-up with cardiology.  Patient is on Eliquis 5 mg twice daily.  Soft tissue mass PCP has ordered ultrasound for further evaluation.  No orders of the defined types were placed in this encounter.  Follow up  3 weeks lab MD trastuzumab.   All questions were answered. The patient knows to call the clinic with any problems, questions or concerns.  [name], MD, PhD [facility] Hematology Oncology 02/09/2022   CHIEF COMPLAINTS/REASON FOR VISIT:  Follow-up for stage I HER2 positive breast cancer  HISTORY OF PRESENTING ILLNESS:   Amber Barnes is a  71 y.o.  female with PMH listed below was seen in consultation at the request of  [name], FNP  for evaluation of weight loss, family history ovarian cancer. Patient reports a history  of nausea vomiting, early satiety, dysphagia symptoms that started in March.  She has lost significant amount of weight as well. Patient weighed 340 pounds in February 2021, 309 pounds in May 2021 and currently she weighs 296 pounds today. Patient has tried Zofran and scopolamine patch which did not relieve her symptoms. 11/05/2019, CT abdomen pelvis showed chronic changes with out acute abnormality to correspond with the patient's symptoms. Patient has had right upper quadrant ultrasound done on 11/14/2019, which showed hepatic steatosis with no other acute abnormality. Patient was seen by gastroenterology Dr. [name] on 11/18/2019.  Patient was recommended to take PPI daily.  Added Carafate.  H. pylori testing came back positive and the patient was started on a course of tetracycline, metronidazole, Pepto-Bismol for H. pylori treatments.  Patient reports that she has been on the treatment for 7 days now.  Nausea is slightly better but still persistent.  She is following up with gastroenterology next week for discussion of EGD. Currently she is not able to tolerate colonoscopy prep due to the nausea.  Her stool was tested positive for occult blood. She also reports night sweating which is a chronic symptom for her. Patient was last seen on 05/20/2020 for evaluation of weight loss, family history of ovarian cancer patient was referred to establish care with genetic counselor.  Patient eventually declined genetic testing.  12/30/2020, patient had a screening mammogram done which showed possible mass and asymmetry in the right breast. 01/08/2021, diagnostic mammogram of the right breast showed 2 adjacent irregular spiculated masses.  Larger 1 was 10 mm and a smaller mass measured 5 mm. 01/15/2021 stereotactic biopsy of the right breast smaller mass showed invasive carcinoma no special  type, grade 2, DCIS present, LVI negative. Ultrasound guided biopsy of the larger mass showed invasive mammary carcinoma,    Patient has appointment with surgery Dr. [name] later this week.  She was referred to establish care with oncology for evaluation.  She was accompanied by daughter. She has no new complaints.  Family history of breast cancer: Denies Family history of other cancers: Patient has a strong family history of leukemia in her mother, colon cancer in her father at age of 14, ovarian cancer in her sister at age of 27, CLL in her brother  02/17/2021, patient is status post lumpectomy and sentinel lymph node biopsy Pathology showed invasive mammary carcinoma, 2 separate foci, DCIS, negative sentinel lymph node biopsy 0/1, grade 3, or margins negative for invasive carcinoma.  ER -, PR + [1-10%], HER2 +  pT1b(m) pN0   # Strong family history of cancer, Discussed again about genetic testing. patient adamantly declined.  #  Intermittent dizziness which per patient is chronic for her due to the vertigo. #03/23/2021 Mediport by vascular surgeon #History of CHF, 03/08/2021, echocardiogram showed LVEF 60-65%. Per patient, she has been seen by Dr. [name] team and was cleared for proceeding with chemotherapy.   03/24/2021 - 03/31/2021 Adjuvant chemotherapy Taxol 80 mg/m2 [name] 04/05/2021 - 04/09/2021 patient was admitted due to neutropenic fever with associated SIRS.  Patient was treated with empiric cefepime and vancomycin until date of discharge when [place] recovered to 1.4.  Post hospitalization, patient also had COVID-19 infection so her follow-up appointment was further postponed for another 2 weeks.  05/10/2021 resumed dose reduce Taxol 4m/m2 /[name]11/15/2022-06/30/2021, cycle 2 dose reduced Taxol 614mm2 /trastuzumab  admitted from 07/12/2021 - 07/17/2021 due to acute pyelonephritis UVJ obstruction and a right hydro ureteralnephrosis.  Patient was treated with IV antibiotics status post emergent bilateral ureteral stent placement.  Discharged home with a course of oral antibiotics.  08/18/2021  - 09/08/2021, status post adjuvant radiation.  Patient had experienced skin toxicities from radiation.  Symptoms are better since the completion of radiation.  10/06/2021, shared decision was made to proceed with maintenance trastuzumab.  Her cardiologist Dr. GoRockey Situave clearance to proceed.  Left butt/thigh tenderness for a few weeks, she was seen by Dr.Sonnenberg who feels it is likely a lipoma, and a ultrasound soft tissue left lower extremity was ordered for further evaluation.  INTERVAL HISTORY JaLegacy Carrenders a [age].o. female who has above history reviewed by me today presents for follow up visit for management of right side HER 2 positive breast cancer Patient is currently on maintenance trastuzumab.  She denies SOB, chest pain, nausea vomiting or diarrhea. Left scapular soft tissue mass, she noticed few weeks ago, has been evaluated by primary care provider.  Felt to be likely lipoma and ultrasound has been ordered for further evaluation.  Review of Systems  Constitutional:  Positive for fatigue. Negative for appetite change, chills, fever and unexpected weight change.       Patient sits in the wheelchair  HENT:   Negative for hearing loss and voice change.   Respiratory:  Negative for chest tightness and cough.   Cardiovascular:  Positive for leg swelling. Negative for chest pain.  Gastrointestinal:  Negative for abdominal distention, abdominal pain, blood in stool, nausea and vomiting.  Endocrine: Negative for hot flashes.  Genitourinary:  Negative for difficulty urinating, frequency and hematuria.   Musculoskeletal:  Negative for arthralgias.       Left butt/thigh area of tenderness, increasing in size.  Skin:  Negative for itching and rash.  Neurological:  Positive for numbness. Negative for dizziness and extremity weakness.  Hematological:  Negative for adenopathy.  Psychiatric/Behavioral:  Negative for confusion.     MEDICAL HISTORY:  Past Medical History:  Diagnosis  Date   Achilles tendinitis    Acute medial meniscal tear    Allergy    Anxiety    Atrial fibrillation ([place])    a. new onset 07/2016; b. CHADS2VASc --> 4 (HTN, female, age, CHF); c. eliquis   CHF (congestive heart failure) (HCC)    Chronic anticoagulation    Apixaban   Chronic lower back pain    Colon polyp    Complication of anesthesia    Emergence delirium; hypoxia   COPD (chronic obstructive pulmonary disease) ([place])    Cystic breast    Depression    Endometriosis    Fibrocystic breast disease    GERD (gastroesophageal reflux disease)    History of stress test    a. 07/2016 MV: EF 55-65%. Small, mild defect  in mid anteroseptal, apical septal, and apical locations. No ischemia-->low risk.   Hyperlipidemia    Hypertension    Insomnia    Lipoma    Malignant neoplasm of upper-outer quadrant of right female breast (HCC)    Stage 1A invasive mammary carcinoma (cT1b or T1c N0); ER (-), PR (+), HER2/neu (+)   Migraine    Mild Mitral regurgitation    a. 07/2016 Echo: EF 55-65%, no rwma, mild MR. Mildly dil LA. Nl RV fx.    Nephrolithiasis    OSA (obstructive sleep apnea)    non-compliant with nocturnal PAP therapy   Osteoarthritis    left knee   Osteopenia    Peptic ulcer disease    a. H. pylori   Pneumonia    RLS (restless legs syndrome)    Tendonitis    Thyroid disease    Vitamin D deficiency     SURGICAL HISTORY: Past Surgical History:  Procedure Laterality Date   ABDOMINAL HYSTERECTOMY     ovaries left   ACHILLES TENDON SURGERY     2012,01/2017   BACK SURGERY     fusion lumbar   BREAST BIOPSY Right 01/15/2021   Affirm biopsy-"X" clip-INVASIVE MAMMARY CARCINOMA   BREAST BIOPSY Right 01/15/2021   u/s bx-"venus" clip INVASIVE MAMMARY CARCINOMA   BREAST BIOPSY     BREAST LUMPECTOMY,RADIO FREQ LOCALIZER,AXILLARY SENTINEL LYMPH NODE BIOPSY Right 02/17/2021   Procedure: BREAST LUMPECTOMY,RADIO FREQ LOCALIZER,AXILLARY SENTINEL LYMPH NODE BIOPSY;  Surgeon: Ronny Bacon, MD;  Location: [facility];  Service: General;  Laterality: Right;   CARDIOVERSION N/A 09/07/2016   Procedure: CARDIOVERSION;  Surgeon: Minna Merritts, MD;  Location: [facility];  Service: Cardiovascular;  Laterality: N/A;   CARDIOVERSION N/A 10/19/2016   Procedure: CARDIOVERSION;  Surgeon: Minna Merritts, MD;  Location: [facility];  Service: Cardiovascular;  Laterality: N/A;   CARDIOVERSION N/A 12/14/2016   Procedure: CARDIOVERSION;  Surgeon: Minna Merritts, MD;  Location: [facility];  Service: Cardiovascular;  Laterality: N/A;   COLONOSCOPY     COLONOSCOPY WITH PROPOFOL N/A 05/13/2020   Procedure: COLONOSCOPY WITH PROPOFOL;  Surgeon: Toledo, Benay Pike, MD;  Location: [facility];  Service: Gastroenterology;  Laterality: N/A;  ELIQUIS   CYSTOSCOPY WITH STENT PLACEMENT Bilateral 07/12/2021   Procedure: CYSTOSCOPY WITH STENT PLACEMENT;  Surgeon: Billey Co, MD;  Location: [facility];  Service: Urology;  Laterality: Bilateral;   CYSTOSCOPY/URETEROSCOPY/HOLMIUM LASER/STENT PLACEMENT Bilateral 07/30/2021   Procedure: CYSTOSCOPY/URETEROSCOPY/HOLMIUM LASER/BILATERAL STENT REMOVAL;  Surgeon: Billey Co, MD;  Location: [facility];  Service: Urology;  Laterality: Bilateral;   ESOPHAGOGASTRODUODENOSCOPY     ESOPHAGOGASTRODUODENOSCOPY (EGD) WITH PROPOFOL N/A 05/13/2020   Procedure: ESOPHAGOGASTRODUODENOSCOPY (EGD) WITH PROPOFOL;  Surgeon: Toledo, Benay Pike, MD;  Location: [facility];  Service: Gastroenterology;  Laterality: N/A;   FOOT SURGERY Left    tendon   JOINT REPLACEMENT Right    hip   PORTA CATH INSERTION N/A 03/23/2021   Procedure: PORTA CATH INSERTION;  Surgeon: Katha Cabal, MD;  Location: [place] [facility];  Service: Cardiovascular;  Laterality: N/A;   PORTACATH PLACEMENT N/A 03/23/2021   Procedure: ATTEMPTED INSERTION PORT-A-CATH;  Surgeon: Ronny Bacon, MD;  Location: [facility];  Service: General;  Laterality: N/A;   THYROIDECTOMY     thyroid lobectomy  secondary to a benign nodule   TOTAL HIP ARTHROPLASTY Right 08/21/2018   Procedure: TOTAL HIP ARTHROPLASTY ANTERIOR APPROACH-RIGHT CELL SAVER REQUESTED;  Surgeon: [name], MD;  Location: [facility];  Service: Orthopedics;  Laterality: Right;    SOCIAL HISTORY: Social History   Socioeconomic History   Marital status: Married    Spouse name: jeffrey   Number of children: 1   Years of education: Not on file   Highest education level: High school graduate  Occupational History    Comment: disabled  Tobacco Use   Smoking status: Former    Packs/day: 0.25    Years: 10.00    Total pack years: 2.50    Types: Cigarettes    Quit date: 2005    Years since quitting: 18.5   Smokeless tobacco: Never   Tobacco comments:    smoked for 10 years, 1 pack every 2-3 days  Vaping Use   Vaping Use: Never used  Substance and Sexual Activity   Alcohol use: Not Currently    Alcohol/week: 1.0 standard drink of alcohol    Types: 1 Glasses of wine per week    Comment: rarely   Drug use: No   Sexual activity: Not Currently  Other Topics Concern   Not on file  Social History Narrative   Moved to [place] from [place], originally from [place].    1 cup of caffeine daily   Right handed   Lives with husband, step-daughter and child       Social Determinants of Health   Financial Resource Strain: Low Risk  (12/09/2021)   Overall Financial Resource Strain (CARDIA)    Difficulty of Paying Living Expenses: Not very hard  Food Insecurity: No Food Insecurity (12/09/2021)   Hunger Vital Sign    Worried About Running Out of Food in the Last Year: Never true    Ran Out of Food in the Last Year: Never true  Transportation Needs: No Transportation Needs (12/09/2021)   PRAPARE - Hydrologist (Medical): No    Lack of Transportation (Non-Medical): No  Physical Activity: Insufficiently Active (12/09/2021)   Exercise Vital Sign    Days of Exercise per Week: 7  days    Minutes of Exercise per Session: 20 min  Stress: Stress Concern Present (12/09/2021)   [place]    Feeling of Stress : Very much  Social Connections: Moderately Isolated (12/09/2021)   Social Connection and Isolation Panel [NHANES]    Frequency of Communication with Friends and Family: Never    Frequency of Social Gatherings with Friends and Family: Once a week    Attends Religious Services:  More than 4 times per year    Active Member of Clubs or Organizations: No    Attends Archivist Meetings: Never    Marital Status: Married  Human resources officer Violence: Not At Risk (12/09/2021)   Humiliation, Afraid, Rape, and Kick questionnaire    Fear of Current or Ex-Partner: No    Emotionally Abused: No    Physically Abused: No    Sexually Abused: No    FAMILY HISTORY: Family History  Problem Relation Age of Onset   Cancer Mother        leukemia   Cancer Father 50       colon   Heart disease Father    Heart attack Father 52       died from MI   Hyperlipidemia Father    Hypertension Father    Cancer Sister 40       ovarian   Depression Sister    Cancer Brother        CLL   Breast cancer Paternal Aunt        30's   Migraines Neg Hx    Thyroid cancer Neg Hx     ALLERGIES:  is allergic to prednisone, amiodarone, hydrocodone-acetaminophen, oxycodone, tapentadol, tramadol, adhesive [tape], and latex.  MEDICATIONS:  Current Outpatient Medications  Medication Sig Dispense Refill   acetaminophen (TYLENOL) 500 MG tablet Take 500-1,000 mg by mouth every 6 (six) hours as needed for mild pain or moderate pain.     ALPRAZolam (XANAX) 0.25 MG tablet Take 1 tablet (0.25 mg total) by mouth 2 (two) times daily as needed for anxiety. 60 tablet 2   apixaban (ELIQUIS) 5 MG TABS tablet TAKE 1 TABLET BY MOUTH TWICE A DAY 180 tablet 1   azelastine (ASTELIN) 0.1 % nasal spray Place 1-2 sprays into both nostrils daily as  needed for rhinitis.     bisoprolol (ZEBETA) 5 MG tablet Take 5 mg by mouth in the morning and at bedtime.     busPIRone (BUSPAR) 5 MG tablet TAKE 1 TABLET BY MOUTH THREE TIMES A DAY AS NEEDED 60 tablet 1   Cholecalciferol (VITAMIN D3) 5000 units CAPS Take 5,000 Units by mouth daily.     clotrimazole (LOTRIMIN) 1 % cream Apply 1 application topically 2 (two) times daily. 30 g 0   diphenhydrAMINE (BENADRYL) 25 mg capsule Take 1 capsule (25 mg total) by mouth every 8 (eight) hours as needed for itching or allergies. 30 capsule 0   esomeprazole (NEXIUM) 20 MG capsule Take 20 mg by mouth daily as needed (GERD symptoms).     FLUoxetine (PROZAC) 20 MG capsule Take 1 capsule (20 mg total) by mouth every morning. 90 capsule 3   furosemide (LASIX) 20 MG tablet TAKE 1 TABLET BY MOUTH TWICE A DAY 180 tablet 0   gabapentin (NEURONTIN) 100 MG capsule Take 1 capsule (100 mg total) by mouth 3 (three) times daily. 270 capsule 0   hydrocortisone 2.5 % ointment Apply topically 2 (two) times daily. prn 60 g 2   KLOR-CON M20 20 MEQ tablet TAKE 1 TABLET BY MOUTH EVERY DAY 90 tablet 1   lidocaine-prilocaine (EMLA) cream Apply to affected area once 30 g 3   loperamide (IMODIUM) 2 MG capsule Take 1 capsule (2 mg total) by mouth See admin instructions. Initial: 4 mg, followed by 2 mg after each loose stool; maximum: 16 mg/day 60 capsule 1   meclizine (ANTIVERT) 12.5 MG tablet Take 1 tablet (12.5 mg total) by mouth 3 (three)  times daily as needed for dizziness. 30 tablet 0   Multiple Vitamin (MULTIVITAMIN) tablet Take 1 tablet by mouth daily.     nystatin (MYCOSTATIN/NYSTOP) powder Apply 1 application. topically 3 (three) times daily. 60 g 11   nystatin cream (MYCOSTATIN) Apply 1 application. topically 2 (two) times daily. Prn abdomen 60 g 5   ondansetron (ZOFRAN) 8 MG tablet Take 1 tablet (8 mg total) by mouth every 8 (eight) hours as needed for nausea or vomiting. 60 tablet 0   prochlorperazine (COMPAZINE) 10 MG tablet  Take 1 tablet (10 mg total) by mouth every 6 (six) hours as needed (Nausea or vomiting). 30 tablet 1   promethazine (PHENERGAN) 25 MG tablet Take 1 tablet (25 mg total) by mouth every 6 (six) hours as needed for nausea or vomiting. 60 tablet 0   ranolazine (RANEXA) 500 MG 12 hr tablet TAKE 1 TABLET BY MOUTH TWICE A DAY 180 tablet 1   rizatriptan (MAXALT) 10 MG tablet TAKE 1 TABLET (10 MG TOTAL) BY MOUTH AS NEEDED FOR MIGRAINE. MAY REPEAT IN 2 HOURS IF NEEDED 10 tablet 0   rOPINIRole (REQUIP) 2 MG tablet TAKE 1 TABLET BY MOUTH AT BEDTIME. 90 tablet 1   rosuvastatin (CRESTOR) 20 MG tablet TAKE 1 TABLET BY MOUTH EVERY DAY 90 tablet 0   fluconazole (DIFLUCAN) 150 MG tablet Take 1 tablet (150 mg total) by mouth once a week. X 2-4 weeks (Patient not taking: Reported on 02/09/2022) 4 tablet 0   Semaglutide-Weight Management (WEGOVY) 0.25 MG/0.5ML SOAJ Inject 0.25 mg into the skin once a week. After 4 weeks, increase to 0.55m Evans qwk (Patient not taking: Reported on 02/09/2022) 2 mL 2   silver sulfADIAZINE (SILVADENE) 1 % cream Apply 1 application. topically 2 (two) times daily. (Patient not taking: Reported on 02/09/2022) 50 g 3   No current facility-administered medications for this visit.     PHYSICAL EXAMINATION: ECOG PERFORMANCE STATUS: 2 - Symptomatic, <50% confined to bed Vitals:   02/09/22 0900  BP: (!) 152/97  Pulse: 60  Resp: 20  Temp: 98.7 F (37.1 C)  SpO2: 97%    Filed Weights   02/09/22 0900  Weight: 259 lb 8 oz (117.7 kg)     Physical Exam Constitutional:      General: She is not in acute distress.    Appearance: She is obese.  HENT:     Head: Normocephalic and atraumatic.  Eyes:     General: No scleral icterus. Cardiovascular:     Rate and Rhythm: Normal rate and regular rhythm.     Heart sounds: Normal heart sounds.  Pulmonary:     Effort: Pulmonary effort is normal. No respiratory distress.     Breath sounds: No wheezing.  Abdominal:     General: Bowel sounds are  normal. There is no distension.     Palpations: Abdomen is soft.  Musculoskeletal:        General: No deformity. Normal range of motion.     Cervical back: Normal range of motion and neck supple.     Comments: 1+ edema bilaterally in lower extremity Small left scapular soft tissue mass,   Skin:    General: Skin is warm and dry.     Findings: No erythema or rash.  Neurological:     Mental Status: She is alert and oriented to person, place, and time. Mental status is at baseline.     Cranial Nerves: No cranial nerve deficit.     Coordination: Coordination normal.  Psychiatric:        Mood and Affect: Mood normal.     . LABORATORY DATA:  I have reviewed the data as listed Lab Results  Component Value Date   WBC 4.7 02/09/2022   HGB 12.4 02/09/2022   HCT 37.0 02/09/2022   MCV 93.9 02/09/2022   PLT 152 02/09/2022   Recent Labs    07/02/21 1256 07/03/21 0711 12/29/21 1244 01/19/22 0948 02/09/22 0849  NA 137   < > 139 136 139  K 3.7   < > 3.8 3.9 3.8  CL 108   < > 104 103 102  CO2 24   < > 27 26 28   GLUCOSE 104*   < > 108* 107* 97  BUN 19   < > 14 15 19   CREATININE 0.77   < > 0.75 0.89 1.00  CALCIUM 8.7*   < > 8.4* 8.4* 8.9  GFRNONAA >60   < > >60 >60 >60  PROT 6.8   < > 6.6 6.7 7.1  ALBUMIN 3.8   < > 3.7 3.7 4.1  AST 20   < > 22 20 24   ALT 15   < > 17 14 17   ALKPHOS 59   < > 73 75 77  BILITOT 0.8   < > 0.5 0.8 0.7  BILIDIR 0.1  --   --   --   --   IBILI 0.7  --   --   --   --    < > = values in this interval not displayed.    Iron/TIBC/Ferritin/ %Sat    Component Value Date/Time   IRON 72 03/31/2021 0856   TIBC 364 03/31/2021 0856   FERRITIN 291 03/31/2021 0856   IRONPCTSAT 20 03/31/2021 0856      RADIOGRAPHIC STUDIES: I have personally reviewed the radiological images as listed and agreed with the findings in the report. No results found.

## 2022-02-09 NOTE — Assessment & Plan Note (Addendum)
#  Right breast invasive carcinoma pT1b pN0 ER negative, PR weakly positive HER 2 positive.  Patient does not tolerate adjuvant chemotherapy with Taxol and trastuzumab despite Taxol dose reduction.-->Status post adjuvant radiation--> maintenance Trastuzumab Labs are reviewed and discussed with patient. Proceed with trastuzumab maintenance Repeat echocardiogram in August 2023.  right diagnostic mammogram in December 2023

## 2022-02-09 NOTE — Patient Instructions (Signed)
[facility]  Discharge Instructions: Thank you for choosing [facility] to provide your oncology and hematology care.   If you have a lab appointment with the Cancer Center, please go directly to the Cancer Center and check in at the registration area.   Wear comfortable clothing and clothing appropriate for easy access to any Portacath or PICC line.   We strive to give you quality time with your provider. You may need to reschedule your appointment if you arrive late (15 or more minutes).  Arriving late affects you and other patients whose appointments are after yours.  Also, if you miss three or more appointments without notifying the office, you may be dismissed from the clinic at the provider's discretion.      For prescription refill requests, have your pharmacy contact our office and allow 72 hours for refills to be completed.    Today you received the following chemotherapy and/or immunotherapy agents       To help prevent nausea and vomiting after your treatment, we encourage you to take your nausea medication as directed.  BELOW ARE SYMPTOMS THAT SHOULD BE REPORTED IMMEDIATELY: *FEVER GREATER THAN 100.4 F (38 C) OR HIGHER *CHILLS OR SWEATING *NAUSEA AND VOMITING THAT IS NOT CONTROLLED WITH YOUR NAUSEA MEDICATION *UNUSUAL SHORTNESS OF BREATH *UNUSUAL BRUISING OR BLEEDING *URINARY PROBLEMS (pain or burning when urinating, or frequent urination) *BOWEL PROBLEMS (unusual diarrhea, constipation, pain near the anus) TENDERNESS IN MOUTH AND THROAT WITH OR WITHOUT PRESENCE OF ULCERS (sore throat, sores in mouth, or a toothache) UNUSUAL RASH, SWELLING OR PAIN  UNUSUAL VAGINAL DISCHARGE OR ITCHING   Items with * indicate a potential emergency and should be followed up as soon as possible or go to the Emergency Department if any problems should occur.  Please show the CHEMOTHERAPY ALERT CARD or IMMUNOTHERAPY ALERT CARD at check-in to the  Emergency Department and triage nurse.  Should you have questions after your visit or need to cancel or reschedule your appointment, please contact [facility]  Dept: [phone]  and follow the prompts.  Office hours are 8:00 a.m. to 4:30 p.m. Monday - Friday. Please note that voicemails left after 4:00 p.m. may not be returned until the following business day.  We are closed weekends and major holidays. You have access to a nurse at all times for urgent questions. Please call the main number to the clinic Dept: [phone] and follow the prompts.   For any non-urgent questions, you may also contact your provider using MyChart. We now offer e-Visits for anyone 18 and older to request care online for non-urgent symptoms. For details visit [email or web address].   Also download the MyChart app! Go to the app store, search "MyChart", open the app, select [facility], and log in with your MyChart username and password.  Masks are optional in the cancer centers. If you would like for your care team to wear a mask while they are taking care of you, please let them know. You may have one support person who is at least 69 years old accompany you for your appointments. 

## 2022-02-09 NOTE — Progress Notes (Signed)
Patient states she has some knots in her neck and back and stated dr.barrnett scheduled an ultrasound. Patient is waiting for that appointment.

## 2022-02-09 NOTE — Assessment & Plan Note (Deleted)
PCP has ordered ultrasound for further evaluation.

## 2022-02-10 ENCOUNTER — Other Ambulatory Visit: Payer: Self-pay | Admitting: Family

## 2022-02-10 DIAGNOSIS — F419 Anxiety disorder, unspecified: Secondary | ICD-10-CM

## 2022-02-12 ENCOUNTER — Other Ambulatory Visit: Payer: Self-pay | Admitting: Family

## 2022-02-12 DIAGNOSIS — F32A Depression, unspecified: Secondary | ICD-10-CM

## 2022-02-14 ENCOUNTER — Other Ambulatory Visit: Payer: Self-pay

## 2022-02-14 DIAGNOSIS — F419 Anxiety disorder, unspecified: Secondary | ICD-10-CM

## 2022-02-14 MED ORDER — BUSPIRONE HCL 5 MG PO TABS: 5.0000 mg | ORAL_TABLET | Freq: Three times a day (TID) | ORAL | 1 refills | Status: DC | PRN

## 2022-02-15 ENCOUNTER — Other Ambulatory Visit: Payer: Self-pay | Admitting: Family

## 2022-02-15 DIAGNOSIS — M7989 Other specified soft tissue disorders: Secondary | ICD-10-CM

## 2022-02-24 ENCOUNTER — Ambulatory Visit: Admission: RE | Admit: 2022-02-24 | Payer: Medicare Other | Source: Ambulatory Visit

## 2022-02-25 ENCOUNTER — Other Ambulatory Visit: Payer: Self-pay | Admitting: Family

## 2022-02-25 ENCOUNTER — Ambulatory Visit
Admission: RE | Admit: 2022-02-25 | Discharge: 2022-02-25 | Disposition: A | Discharge status: Home / Self Care | Payer: Medicare Other | Source: Ambulatory Visit | Attending: Oncology | Admitting: Oncology

## 2022-02-25 DIAGNOSIS — J449 Chronic obstructive pulmonary disease, unspecified: Secondary | ICD-10-CM | POA: Diagnosis not present

## 2022-02-25 DIAGNOSIS — I4891 Unspecified atrial fibrillation: Secondary | ICD-10-CM | POA: Insufficient documentation

## 2022-02-25 DIAGNOSIS — I509 Heart failure, unspecified: Secondary | ICD-10-CM | POA: Diagnosis not present

## 2022-02-25 DIAGNOSIS — E785 Hyperlipidemia, unspecified: Secondary | ICD-10-CM | POA: Insufficient documentation

## 2022-02-25 DIAGNOSIS — Z5111 Encounter for antineoplastic chemotherapy: Secondary | ICD-10-CM | POA: Diagnosis not present

## 2022-02-25 DIAGNOSIS — M7989 Other specified soft tissue disorders: Secondary | ICD-10-CM

## 2022-02-25 DIAGNOSIS — I110 Hypertensive heart disease with heart failure: Secondary | ICD-10-CM | POA: Insufficient documentation

## 2022-02-25 DIAGNOSIS — Z0189 Encounter for other specified special examinations: Secondary | ICD-10-CM

## 2022-02-25 LAB — ECHOCARDIOGRAM COMPLETE
AR max vel: 1.6 cm2
AV Area VTI: 1.83 cm2
AV Area mean vel: 1.69 cm2
AV Mean grad: 4 mmHg
AV Peak grad: 7.4 mmHg
Ao pk vel: 1.36 m/s
Area-P 1/2: 4.1 cm2
S' Lateral: 3.3 cm

## 2022-02-25 NOTE — Progress Notes (Signed)
*  PRELIMINARY RESULTS* Echocardiogram 2D Echocardiogram has been performed.  Amber Barnes 02/25/2022, 10:46 AM

## 2022-02-25 NOTE — Progress Notes (Signed)
*  PRELIMINARY RESULTS* Echocardiogram 2D Echocardiogram has been performed.  Sherrie Sport 02/25/2022, 10:43 AM

## 2022-03-02 ENCOUNTER — Inpatient Hospital Stay (HOSPITAL_BASED_OUTPATIENT_CLINIC_OR_DEPARTMENT_OTHER): Payer: Medicare Other | Admitting: Oncology

## 2022-03-02 ENCOUNTER — Encounter: Payer: Self-pay | Admitting: Oncology

## 2022-03-02 ENCOUNTER — Inpatient Hospital Stay: Payer: Medicare Other

## 2022-03-02 VITALS — BP 141/93 | HR 57 | Resp 18 | Ht 65.0 in | Wt 258.0 lb

## 2022-03-02 DIAGNOSIS — C50919 Malignant neoplasm of unspecified site of unspecified female breast: Secondary | ICD-10-CM

## 2022-03-02 DIAGNOSIS — M5126 Other intervertebral disc displacement, lumbar region: Secondary | ICD-10-CM | POA: Insufficient documentation

## 2022-03-02 DIAGNOSIS — C50411 Malignant neoplasm of upper-outer quadrant of right female breast: Secondary | ICD-10-CM

## 2022-03-02 DIAGNOSIS — Z5112 Encounter for antineoplastic immunotherapy: Secondary | ICD-10-CM | POA: Diagnosis not present

## 2022-03-02 DIAGNOSIS — M1610 Unilateral primary osteoarthritis, unspecified hip: Secondary | ICD-10-CM | POA: Insufficient documentation

## 2022-03-02 DIAGNOSIS — Z171 Estrogen receptor negative status [ER-]: Secondary | ICD-10-CM | POA: Diagnosis not present

## 2022-03-02 DIAGNOSIS — G629 Polyneuropathy, unspecified: Secondary | ICD-10-CM | POA: Diagnosis not present

## 2022-03-02 DIAGNOSIS — Z95828 Presence of other vascular implants and grafts: Secondary | ICD-10-CM

## 2022-03-02 HISTORY — DX: Unilateral primary osteoarthritis, unspecified hip: M16.10

## 2022-03-02 LAB — COMPREHENSIVE METABOLIC PANEL
ALT: 13 U/L (ref 0–44)
AST: 19 U/L (ref 15–41)
Albumin: 4.1 g/dL (ref 3.5–5.0)
Alkaline Phosphatase: 77 U/L (ref 38–126)
Anion gap: 10 (ref 5–15)
BUN: 26 mg/dL — ABNORMAL HIGH (ref 8–23)
CO2: 26 mmol/L (ref 22–32)
Calcium: 8.9 mg/dL (ref 8.9–10.3)
Chloride: 101 mmol/L (ref 98–111)
Creatinine, Ser: 0.98 mg/dL (ref 0.44–1.00)
GFR, Estimated: >60 mL/min (ref >60)
Glucose, Bld: 92 mg/dL (ref 70–99)
Potassium: 3.9 mmol/L (ref 3.5–5.1)
Sodium: 137 mmol/L (ref 135–145)
Total Bilirubin: 0.8 mg/dL (ref 0.3–1.2)
Total Protein: 7.2 g/dL (ref 6.5–8.1)

## 2022-03-02 LAB — CBC WITH DIFFERENTIAL/PLATELET
Abs Immature Granulocytes: 0.02 10*3/uL (ref 0.00–0.07)
Basophils Absolute: 0 10*3/uL (ref 0.0–0.1)
Basophils Relative: 0 %
Eosinophils Absolute: 0.1 10*3/uL (ref 0.0–0.5)
Eosinophils Relative: 1 %
HCT: 37.9 % (ref 36.0–46.0)
Hemoglobin: 12.6 g/dL (ref 12.0–15.0)
Immature Granulocytes: 0 %
Lymphocytes Relative: 26 %
Lymphs Abs: 1.3 10*3/uL (ref 0.7–4.0)
MCH: 31.3 pg (ref 26.0–34.0)
MCHC: 33.2 g/dL (ref 30.0–36.0)
MCV: 94.3 fL (ref 80.0–100.0)
Monocytes Absolute: 0.3 10*3/uL (ref 0.1–1.0)
Monocytes Relative: 7 %
Neutro Abs: 3.2 10*3/uL (ref 1.7–7.7)
Neutrophils Relative %: 66 %
Platelets: 165 10*3/uL (ref 150–400)
RBC: 4.02 MIL/uL (ref 3.87–5.11)
RDW: 13 % (ref 11.5–15.5)
WBC: 4.9 10*3/uL (ref 4.0–10.5)
nRBC: 0 % (ref 0.0–0.2)

## 2022-03-02 MED ORDER — TRASTUZUMAB-ANNS CHEMO 150 MG IV SOLR
6.0000 mg/kg | Freq: Once | INTRAVENOUS | Status: AC
  Administered 2022-03-02: 714 mg via INTRAVENOUS
  Filled 2022-03-02: qty 34

## 2022-03-02 MED ORDER — HEPARIN SOD (PORK) LOCK FLUSH 100 UNIT/ML IV SOLN
500.0000 [IU] | Freq: Once | INTRAVENOUS | Status: AC
  Administered 2022-03-02: 500 [IU] via INTRAVENOUS
  Filled 2022-03-02: qty 5

## 2022-03-02 MED ORDER — SODIUM CHLORIDE 0.9% FLUSH
10.0000 mL | INTRAVENOUS | Status: DC | PRN
  Administered 2022-03-02: 10 mL via INTRAVENOUS
  Filled 2022-03-02: qty 10

## 2022-03-02 MED ORDER — ACETAMINOPHEN 325 MG PO TABS
650.0000 mg | ORAL_TABLET | Freq: Once | ORAL | Status: AC
  Administered 2022-03-02: 650 mg via ORAL

## 2022-03-02 MED ORDER — SODIUM CHLORIDE 0.9 % IV SOLN
Freq: Once | INTRAVENOUS | Status: AC
  Filled 2022-03-02: qty 250

## 2022-03-02 MED ORDER — DIPHENHYDRAMINE HCL 25 MG PO CAPS
50.0000 mg | ORAL_CAPSULE | Freq: Once | ORAL | Status: AC
  Administered 2022-03-02: 50 mg via ORAL

## 2022-03-02 NOTE — Progress Notes (Signed)
The following biosimilar Kanjinti (trastuzumab-anns) has been selected for use in this patient.

## 2022-03-02 NOTE — Patient Instructions (Signed)
[facility]  Discharge Instructions: Thank you for choosing [facility] to provide your oncology and hematology care.  If you have a lab appointment with the Cancer Center, please go directly to the Cancer Center and check in at the registration area.  Wear comfortable clothing and clothing appropriate for easy access to any Portacath or PICC line.   We strive to give you quality time with your provider. You may need to reschedule your appointment if you arrive late (15 or more minutes).  Arriving late affects you and other patients whose appointments are after yours.  Also, if you miss three or more appointments without notifying the office, you may be dismissed from the clinic at the provider's discretion.      For prescription refill requests, have your pharmacy contact our office and allow 72 hours for refills to be completed.       To help prevent nausea and vomiting after your treatment, we encourage you to take your nausea medication as directed.  BELOW ARE SYMPTOMS THAT SHOULD BE REPORTED IMMEDIATELY: *FEVER GREATER THAN 100.4 F (38 C) OR HIGHER *CHILLS OR SWEATING *NAUSEA AND VOMITING THAT IS NOT CONTROLLED WITH YOUR NAUSEA MEDICATION *UNUSUAL SHORTNESS OF BREATH *UNUSUAL BRUISING OR BLEEDING *URINARY PROBLEMS (pain or burning when urinating, or frequent urination) *BOWEL PROBLEMS (unusual diarrhea, constipation, pain near the anus) TENDERNESS IN MOUTH AND THROAT WITH OR WITHOUT PRESENCE OF ULCERS (sore throat, sores in mouth, or a toothache) UNUSUAL RASH, SWELLING OR PAIN  UNUSUAL VAGINAL DISCHARGE OR ITCHING   Items with * indicate a potential emergency and should be followed up as soon as possible or go to the Emergency Department if any problems should occur.  Please show the CHEMOTHERAPY ALERT CARD or IMMUNOTHERAPY ALERT CARD at check-in to the Emergency Department and triage nurse.  Should you have questions after your  visit or need to cancel or reschedule your appointment, please contact [facility]  [phone] and follow the prompts.  Office hours are 8:00 a.m. to 4:30 p.m. Monday - Friday. Please note that voicemails left after 4:00 p.m. may not be returned until the following business day.  We are closed weekends and major holidays. You have access to a nurse at all times for urgent questions. Please call the main number to the clinic [phone] and follow the prompts.  For any non-urgent questions, you may also contact your provider using MyChart. We now offer e-Visits for anyone 18 and older to request care online for non-urgent symptoms. For details visit [email or web address].   Also download the MyChart app! Go to the app store, search "MyChart", open the app, select [place], and log in with your MyChart username and password.  Masks are optional in the cancer centers. If you would like for your care team to wear a mask while they are taking care of you, please let them know. For doctor visits, patients may have with them one support person who is at least 69 years old. At this time, visitors are not allowed in the infusion area.   

## 2022-03-02 NOTE — Progress Notes (Signed)
ON PATHWAY REGIMEN - Breast  No Change  Continue With Treatment as Ordered.  Original Decision Date/Time: 03/03/2021 17:18     Cycle 1: A cycle is 7 days:     Trastuzumab-xxxx      Paclitaxel    Cycles 2 through 12: A cycle is every 7 days:     Trastuzumab-xxxx      Paclitaxel    Cycles 13 through 25: A cycle is every 21 days:     Trastuzumab-xxxx   **Always confirm dose/schedule in your pharmacy ordering system**  Patient Characteristics: Postoperative without Neoadjuvant Therapy (Pathologic Staging), Invasive Disease, Adjuvant Therapy, HER2 Positive, ER Negative/Unknown, Node Negative, pT1b, pN0/N1mi, Chemotherapy Indicated Therapeutic Status: Postoperative without Neoadjuvant Therapy (Pathologic Staging) AJCC Grade: G3 AJCC N Category: pN0 AJCC M Category: cM0 ER Status: Negative (-) AJCC 8 Stage Grouping: IA HER2 Status: Positive (+) Oncotype Dx Recurrence Score: Not Appropriate AJCC T Category: pT1b PR Status: Positive (+) Adjuvant Therapy Status: No Adjuvant Therapy Received Yet or Changing Initial Adjuvant Regimen due to Tolerance Intent of Therapy: Curative Intent, Discussed with Patient 

## 2022-03-02 NOTE — Progress Notes (Signed)
Hematology/Oncology Progress note [phone] Fax:([phone]      ASSESSMENT & PLAN:   Cancer Staging  Malignant neoplasm of upper-outer quadrant of right female breast [facility]) Staging form: Breast, AJCC 8th Edition - Pathologic stage from 03/03/2021: Stage IA (pT1b, pN0, cM0, G3, ER-, PR+, HER2+) - Signed by [name], MD on 03/03/2021   Malignant neoplasm of upper-outer quadrant of right female breast [facility]) #Right breast invasive carcinoma pT1b pN0 ER negative, PR weakly positive HER 2 positive.  Patient does not tolerate adjuvant chemotherapy with Taxol and trastuzumab despite Taxol dose reduction.-->Status post adjuvant radiation--> maintenance Trastuzumab Labs are reviewed and discussed with patient. Proceed with trastuzumab maintenance. August 2023 Echo showed stable LVEF Obtain right diagnostic mammogram in December 2023  No orders of the defined types were placed in this encounter.  Follow up  3 weeks lab MD trastuzumab.   All questions were answered. The patient knows to call the clinic with any problems, questions or concerns.  [name], MD, PhD [facility] Hematology Oncology 03/02/2022   CHIEF COMPLAINTS/REASON FOR VISIT:  Follow-up for stage I HER2 positive breast cancer  HISTORY OF PRESENTING ILLNESS:   Amber Barnes is a  71 y.o.  female presents for follow up of right breast cancer.  Oncology History  Malignant neoplasm of upper-outer quadrant of right female breast ([place])  01/19/2021 Initial Diagnosis   Breast cancer in female  -12/30/2020, patient had a screening mammogram done which showed possible mass and asymmetry in the right breast. 01/08/2021, diagnostic mammogram of the right breast showed 2 adjacent irregular spiculated masses.  Larger 1 was 10 mm and a smaller mass measured 5 mm. 01/15/2021 stereotactic biopsy of the right breast smaller mass showed invasive carcinoma no special type, grade 2, DCIS present, LVI negative.Ultrasound guided  biopsy of the larger mass showed invasive mammary carcinoma  02/17/2021, patient is status post lumpectomy and sentinel lymph node biopsy Pathology showed invasive mammary carcinoma, 2 separate foci, DCIS, negative sentinel lymph node biopsy 0/1, grade 3, or margins negative for invasive carcinoma.  ER -, PR + [1-10%], HER2 +  pT1b(m) pN0   Strong family history of cancer, Discussed again about genetic testing. patient adamantly declined.   03/03/2021 Cancer Staging   Staging form: Breast, AJCC 8th Edition - Pathologic stage from 03/03/2021: Stage IA (pT1b, pN0, cM0, G3, ER-, PR+, HER2+) - Signed by [name], MD on 03/03/2021 Stage prefix: Initial diagnosis Multigene prognostic tests performed: None Histologic grading system: 3 grade system   03/08/2021 Echocardiogram   echocardiogram showed LVEF 60-65%. Per patient, she has been seen by Dr. [name] team and was cleared for proceeding with chemotherapy.    02/2021 Genetic Testing   Strong family history of cancer, Discussed again about genetic testing. patient adamantly declined.   03/23/2021 Procedure   Mediport by vascular surgeon   03/24/2021 -  Chemotherapy   03/24/2021 - 03/31/2021 Taxol 80 mg/m2 [name] 04/05/2021 - 04/09/2021 patient was admitted due to neutropenic fever with associated SIRS.  Patient was treated with empiric cefepime and vancomycin until date of discharge when [place] recovered to 1.4. Post hospitalization, patient also had COVID-19 infection so her follow-up appointment was further postponed for another 2 weeks.  05/10/2021 resumed dose reduce Taxol 53m/m2 /[name]11/15/2022-06/30/2021, cycle 2 dose reduced Taxol 656mm2 /trastuzumab   admitted from 07/12/2021 - 07/17/2021 due to acute pyelonephritis UVJ obstruction and a right hydro ureteralnephrosis.  Patient was treated with IV antibiotics status post emergent bilateral ureteral stent placement.  Discharged  home with a course of oral antibiotics.  Shared  decision made to hold chemotherapy and proceed with radiation.    08/18/2021 - 09/08/2021 Radiation Therapy    adjuvant breast radiation   10/06/2021 -  Chemotherapy   10/06/2021, shared decision was made to proceed with maintenance trastuzumab.  Her cardiologist Dr. [name] gave clearance to proceed. Plan Transtuzumab for up to a year if tolerates.    12/14/2021 Mammogram   Bilateral diagnostic mammogram 1. Probably benign right breast calcifications along the posterior lumpectomy bed felt to represent early changes of fat necrosis.Recommend short-term imaging follow-up. 2. No mammographic evidence of malignancy on the left.   03/02/2022 Echocardiogram   LVEF 24-23%, grade I diastolic dysfuction   [id number] carcinoma of breast (HCC)    Left butt/thigh tenderness for a few weeks, ultrasound soft tissue left lower extremity negative.   INTERVAL HISTORY Amber Barnes is a 71 y.o. female who has above history reviewed by me today presents for follow up visit for management of right side HER 2 positive breast cancer  Left scapular soft tissue mass, PCP ordered a CT scan for further evaluation. Patient has no new complaints.  Denies any shortness of breath, chest pain..  Review of Systems  Constitutional:  Positive for fatigue. Negative for appetite change, chills, fever and unexpected weight change.       Patient sits in the wheelchair  HENT:   Negative for hearing loss and voice change.   Respiratory:  Negative for chest tightness and cough.   Cardiovascular:  Negative for chest pain and leg swelling.  Gastrointestinal:  Negative for abdominal distention, abdominal pain, blood in stool, nausea and vomiting.  Endocrine: Negative for hot flashes.  Genitourinary:  Negative for difficulty urinating, frequency and hematuria.   Musculoskeletal:  Positive for arthralgias.  Skin:  Negative for itching and rash.  Neurological:  Positive for numbness. Negative for dizziness and extremity  weakness.  Hematological:  Negative for adenopathy.  Psychiatric/Behavioral:  Negative for confusion.     MEDICAL HISTORY:  Past Medical History:  Diagnosis Date   Achilles tendinitis    Acute medial meniscal tear    Allergy    Anxiety    Atrial fibrillation ([place])    a. new onset 07/2016; b. CHADS2VASc --> 4 (HTN, female, age, CHF); c. eliquis   CHF (congestive heart failure) (HCC)    Chronic anticoagulation    Apixaban   Chronic lower back pain    Colon polyp    Complication of anesthesia    Emergence delirium; hypoxia   COPD (chronic obstructive pulmonary disease) ([place])    Cystic breast    Depression    Endometriosis    Fibrocystic breast disease    GERD (gastroesophageal reflux disease)    History of stress test    a. 07/2016 MV: EF 55-65%. Small, mild defect  in mid anteroseptal, apical septal, and apical locations. No ischemia-->low risk.   Hyperlipidemia    Hypertension    Insomnia    Lipoma    Malignant neoplasm of upper-outer quadrant of right female breast (HCC)    Stage 1A invasive mammary carcinoma (cT1b or T1c N0); ER (-), PR (+), HER2/neu (+)   Migraine    Mild Mitral regurgitation    a. 07/2016 Echo: EF 55-65%, no rwma, mild MR. Mildly dil LA. Nl RV fx.    Nephrolithiasis    OSA (obstructive sleep apnea)    non-compliant with nocturnal PAP therapy   Osteoarthritis    left knee  Osteopenia    Peptic ulcer disease    a. H. pylori   Pneumonia    RLS (restless legs syndrome)    Tendonitis    Thyroid disease    Vitamin D deficiency     SURGICAL HISTORY: Past Surgical History:  Procedure Laterality Date   ABDOMINAL HYSTERECTOMY     ovaries left   ACHILLES TENDON SURGERY     2012,01/2017   BACK SURGERY     fusion lumbar   BREAST BIOPSY Right 01/15/2021   Affirm biopsy-"X" clip-INVASIVE MAMMARY CARCINOMA   BREAST BIOPSY Right 01/15/2021   u/s bx-"venus" clip INVASIVE MAMMARY CARCINOMA   BREAST BIOPSY     BREAST LUMPECTOMY,RADIO FREQ  LOCALIZER,AXILLARY SENTINEL LYMPH NODE BIOPSY Right 02/17/2021   Procedure: BREAST LUMPECTOMY,RADIO FREQ LOCALIZER,AXILLARY SENTINEL LYMPH NODE BIOPSY;  Surgeon: Ronny Bacon, MD;  Location: [facility];  Service: General;  Laterality: Right;   CARDIOVERSION N/A 09/07/2016   Procedure: CARDIOVERSION;  Surgeon: Minna Merritts, MD;  Location: [facility];  Service: Cardiovascular;  Laterality: N/A;   CARDIOVERSION N/A 10/19/2016   Procedure: CARDIOVERSION;  Surgeon: Minna Merritts, MD;  Location: [facility];  Service: Cardiovascular;  Laterality: N/A;   CARDIOVERSION N/A 12/14/2016   Procedure: CARDIOVERSION;  Surgeon: Minna Merritts, MD;  Location: [facility];  Service: Cardiovascular;  Laterality: N/A;   COLONOSCOPY     COLONOSCOPY WITH PROPOFOL N/A 05/13/2020   Procedure: COLONOSCOPY WITH PROPOFOL;  Surgeon: Toledo, Benay Pike, MD;  Location: [facility];  Service: Gastroenterology;  Laterality: N/A;  ELIQUIS   CYSTOSCOPY WITH STENT PLACEMENT Bilateral 07/12/2021   Procedure: CYSTOSCOPY WITH STENT PLACEMENT;  Surgeon: Billey Co, MD;  Location: [facility];  Service: Urology;  Laterality: Bilateral;   CYSTOSCOPY/URETEROSCOPY/HOLMIUM LASER/STENT PLACEMENT Bilateral 07/30/2021   Procedure: CYSTOSCOPY/URETEROSCOPY/HOLMIUM LASER/BILATERAL STENT REMOVAL;  Surgeon: Billey Co, MD;  Location: [facility];  Service: Urology;  Laterality: Bilateral;   ESOPHAGOGASTRODUODENOSCOPY     ESOPHAGOGASTRODUODENOSCOPY (EGD) WITH PROPOFOL N/A 05/13/2020   Procedure: ESOPHAGOGASTRODUODENOSCOPY (EGD) WITH PROPOFOL;  Surgeon: Toledo, Benay Pike, MD;  Location: [facility];  Service: Gastroenterology;  Laterality: N/A;   FOOT SURGERY Left    tendon   JOINT REPLACEMENT Right    hip   PORTA CATH INSERTION N/A 03/23/2021   Procedure: PORTA CATH INSERTION;  Surgeon: Katha Cabal, MD;  Location: [facility];  Service: Cardiovascular;  Laterality: N/A;   PORTACATH PLACEMENT N/A 03/23/2021    Procedure: ATTEMPTED INSERTION PORT-A-CATH;  Surgeon: Ronny Bacon, MD;  Location: [facility];  Service: General;  Laterality: N/A;   THYROIDECTOMY     thyroid lobectomy secondary to a benign nodule   TOTAL HIP ARTHROPLASTY Right 08/21/2018   Procedure: TOTAL HIP ARTHROPLASTY ANTERIOR APPROACH-RIGHT CELL SAVER REQUESTED;  Surgeon: [name], MD;  Location: [facility];  Service: Orthopedics;  Laterality: Right;    SOCIAL HISTORY: Social History   Socioeconomic History   Marital status: Married    Spouse name: jeffrey   Number of children: 1   Years of education: Not on file   Highest education level: High school graduate  Occupational History    Comment: disabled  Tobacco Use   Smoking status: Former    Packs/day: 0.25    Years: 10.00    Total pack years: 2.50    Types: Cigarettes    Quit date: 2005    Years since quitting: 18.6   Smokeless tobacco: Never   Tobacco comments:    smoked for 10 years, 1 pack every 2-3  days  Vaping Use   Vaping Use: Never used  Substance and Sexual Activity   Alcohol use: Not Currently    Alcohol/week: 1.0 standard drink of alcohol    Types: 1 Glasses of wine per week    Comment: rarely   Drug use: No   Sexual activity: Not Currently  Other Topics Concern   Not on file  Social History Narrative   Moved to [place] from [place], originally from [place].    1 cup of caffeine daily   Right handed   Lives with husband, step-daughter and child       Social Determinants of Health   Financial Resource Strain: Low Risk  (12/09/2021)   Overall Financial Resource Strain (CARDIA)    Difficulty of Paying Living Expenses: Not very hard  Food Insecurity: No Food Insecurity (12/09/2021)   Hunger Vital Sign    Worried About Running Out of Food in the Last Year: Never true    Ran Out of Food in the Last Year: Never true  Transportation Needs: No Transportation Needs (12/09/2021)   PRAPARE - Radiographer, therapeutic (Medical): No    Lack of Transportation (Non-Medical): No  Physical Activity: Insufficiently Active (12/09/2021)   Exercise Vital Sign    Days of Exercise per Week: 7 days    Minutes of Exercise per Session: 20 min  Stress: Stress Concern Present (12/09/2021)   [place]    Feeling of Stress : Very much  Social Connections: Moderately Isolated (12/09/2021)   Social Connection and Isolation Panel [NHANES]    Frequency of Communication with Friends and Family: Never    Frequency of Social Gatherings with Friends and Family: Once a week    Attends Religious Services: More than 4 times per year    Active Member of [employer] or Organizations: No    Attends Archivist Meetings: Never    Marital Status: Married  Human resources officer Violence: Not At Risk (12/09/2021)   Humiliation, Afraid, Rape, and Kick questionnaire    Fear of Current or Ex-Partner: No    Emotionally Abused: No    Physically Abused: No    Sexually Abused: No    FAMILY HISTORY: Family History  Problem Relation Age of Onset   Cancer Mother        leukemia   Cancer Father 58       colon   Heart disease Father    Heart attack Father 78       died from MI   Hyperlipidemia Father    Hypertension Father    Cancer Sister 56       ovarian   Depression Sister    Cancer Brother        CLL   Breast cancer Paternal Aunt        40's   Migraines Neg Hx    Thyroid cancer Neg Hx     ALLERGIES:  is allergic to prednisone, amiodarone, hydrocodone-acetaminophen, oxycodone, tapentadol, tramadol, adhesive [tape], and latex.  MEDICATIONS:  Current Outpatient Medications  Medication Sig Dispense Refill   acetaminophen (TYLENOL) 500 MG tablet Take 500-1,000 mg by mouth every 6 (six) hours as needed for mild pain or moderate pain.     ALPRAZolam (XANAX) 0.25 MG tablet TAKE 1 TABLET BY MOUTH 2 TIMES DAILY AS NEEDED FOR ANXIETY. 60 tablet 2   apixaban  (ELIQUIS) 5 MG TABS tablet TAKE 1 TABLET BY MOUTH TWICE  A DAY 180 tablet 1   azelastine (ASTELIN) 0.1 % nasal spray Place 1-2 sprays into both nostrils daily as needed for rhinitis.     bisoprolol (ZEBETA) 5 MG tablet Take 5 mg by mouth in the morning and at bedtime.     busPIRone (BUSPAR) 5 MG tablet Take 1 tablet (5 mg total) by mouth 3 (three) times daily as needed. 60 tablet 1   Cholecalciferol (VITAMIN D3) 5000 units CAPS Take 5,000 Units by mouth daily.     clotrimazole (LOTRIMIN) 1 % cream Apply 1 application topically 2 (two) times daily. 30 g 0   diphenhydrAMINE (BENADRYL) 25 mg capsule Take 1 capsule (25 mg total) by mouth every 8 (eight) hours as needed for itching or allergies. 30 capsule 0   esomeprazole (NEXIUM) 20 MG capsule Take 20 mg by mouth daily as needed (GERD symptoms).     FLUoxetine (PROZAC) 20 MG capsule Take 1 capsule (20 mg total) by mouth every morning. 90 capsule 3   furosemide (LASIX) 20 MG tablet TAKE 1 TABLET BY MOUTH TWICE A DAY 180 tablet 0   gabapentin (NEURONTIN) 100 MG capsule Take 1 capsule (100 mg total) by mouth 3 (three) times daily. 270 capsule 0   hydrocortisone 2.5 % ointment Apply topically 2 (two) times daily. prn 60 g 2   KLOR-CON M20 20 MEQ tablet TAKE 1 TABLET BY MOUTH EVERY DAY 90 tablet 1   loperamide (IMODIUM) 2 MG capsule Take 1 capsule (2 mg total) by mouth See admin instructions. Initial: 4 mg, followed by 2 mg after each loose stool; maximum: 16 mg/day 60 capsule 1   meclizine (ANTIVERT) 12.5 MG tablet Take 1 tablet (12.5 mg total) by mouth 3 (three) times daily as needed for dizziness. 30 tablet 0   Multiple Vitamin (MULTIVITAMIN) tablet Take 1 tablet by mouth daily.     nystatin (MYCOSTATIN/NYSTOP) powder Apply 1 application. topically 3 (three) times daily. 60 g 11   nystatin cream (MYCOSTATIN) Apply 1 application. topically 2 (two) times daily. Prn abdomen 60 g 5   ondansetron (ZOFRAN) 8 MG tablet Take 1 tablet (8 mg total) by mouth every  8 (eight) hours as needed for nausea or vomiting. 60 tablet 0   promethazine (PHENERGAN) 25 MG tablet Take 1 tablet (25 mg total) by mouth every 6 (six) hours as needed for nausea or vomiting. 60 tablet 0   ranolazine (RANEXA) 500 MG 12 hr tablet TAKE 1 TABLET BY MOUTH TWICE A DAY 180 tablet 1   rizatriptan (MAXALT) 10 MG tablet TAKE 1 TABLET (10 MG TOTAL) BY MOUTH AS NEEDED FOR MIGRAINE. MAY REPEAT IN 2 HOURS IF NEEDED 10 tablet 0   rOPINIRole (REQUIP) 2 MG tablet TAKE 1 TABLET BY MOUTH AT BEDTIME. 90 tablet 1   rosuvastatin (CRESTOR) 20 MG tablet TAKE 1 TABLET BY MOUTH EVERY DAY 90 tablet 0   fluconazole (DIFLUCAN) 150 MG tablet Take 1 tablet (150 mg total) by mouth once a week. X 2-4 weeks (Patient not taking: Reported on 02/09/2022) 4 tablet 0   Semaglutide-Weight Management (WEGOVY) 0.25 MG/0.5ML SOAJ Inject 0.25 mg into the skin once a week. After 4 weeks, increase to 0.8m [place] qwk (Patient not taking: Reported on 02/09/2022) 2 mL 2   silver sulfADIAZINE (SILVADENE) 1 % cream Apply 1 application. topically 2 (two) times daily. (Patient not taking: Reported on 02/09/2022) 50 g 3   No current facility-administered medications for this visit.   Facility-Administered Medications Ordered in Other Visits  Medication Dose Route Frequency Provider Last Rate Last Admin   sodium chloride flush (NS) 0.9 % injection 10 mL  10 mL Intravenous PRN [name], MD   10 mL at 03/02/22 0855     PHYSICAL EXAMINATION: ECOG PERFORMANCE STATUS: 1 - Symptomatic but completely ambulatory Vitals:   03/02/22 0907  BP: (!) 141/93  Pulse: (!) 57  Resp: 18  SpO2: 98%    Filed Weights   03/02/22 0907  Weight: 258 lb (117 kg)     Physical Exam Constitutional:      General: She is not in acute distress.    Appearance: She is obese.  HENT:     Head: Normocephalic and atraumatic.  Eyes:     General: No scleral icterus. Cardiovascular:     Rate and Rhythm: Normal rate and regular rhythm.     Heart sounds:  Normal heart sounds.  Pulmonary:     Effort: Pulmonary effort is normal. No respiratory distress.     Breath sounds: No wheezing.  Abdominal:     General: Bowel sounds are normal. There is no distension.     Palpations: Abdomen is soft.  Musculoskeletal:        General: No deformity. Normal range of motion.     Cervical back: Normal range of motion and neck supple.     Comments: 1+ edema bilaterally in lower extremity Small left scapular soft tissue mass,   Skin:    General: Skin is warm and dry.     Findings: No erythema or rash.  Neurological:     Mental Status: She is alert and oriented to person, place, and time. Mental status is at baseline.     Cranial Nerves: No cranial nerve deficit.     Coordination: Coordination normal.  Psychiatric:        Mood and Affect: Mood normal.     . LABORATORY DATA:  I have reviewed the data as listed    Latest Ref Rng & Units 03/02/2022    8:55 AM 02/09/2022    8:49 AM 01/19/2022    9:48 AM  CBC  WBC 4.0 - 10.5 K/uL 4.9  4.7  4.5   Hemoglobin 12.0 - 15.0 g/dL 12.6  12.4  12.3   Hematocrit 36.0 - 46.0 % 37.9  37.0  36.3   Platelets 150 - 400 K/uL 165  152  146       Latest Ref Rng & Units 03/02/2022    8:55 AM 02/09/2022    8:49 AM 01/19/2022    9:48 AM  CMP  Glucose 70 - 99 mg/dL 92  97  107   BUN 8 - 23 mg/dL 26  19  15    Creatinine 0.44 - 1.00 mg/dL 0.98  1.00  0.89   Sodium 135 - 145 mmol/L 137  139  136   Potassium 3.5 - 5.1 mmol/L 3.9  3.8  3.9   Chloride 98 - 111 mmol/L 101  102  103   CO2 22 - 32 mmol/L 26  28  26    Calcium 8.9 - 10.3 mg/dL 8.9  8.9  8.4   Total Protein 6.5 - 8.1 g/dL 7.2  7.1  6.7   Total Bilirubin 0.3 - 1.2 mg/dL 0.8  0.7  0.8   Alkaline Phos 38 - 126 U/L 77  77  75   AST 15 - 41 U/L 19  24  20    ALT 0 - 44 U/L 13  17  14  RADIOGRAPHIC STUDIES: I have personally reviewed the radiological images as listed and agreed with the findings in the report. ECHOCARDIOGRAM COMPLETE  Result Date:  02/25/2022    ECHOCARDIOGRAM REPORT   Patient Name:   TREACY HOLCOMB Date of Exam: 02/25/2022 Medical Rec #:  [id number]        Height:       65.0 in Accession #:    [id number]       Weight:       259.5 lb Date of Birth:  [birth year]        BSA:          2.210 m Patient Age:    44 years         BP:           156/83 mmHg Patient Gender: F                HR:           52 bpm. Exam Location:  [facility] Procedure: 2D Echo, Cardiac Doppler, Color Doppler and Strain Analysis Indications:     Chemo Z09  History:         Patient has prior history of Echocardiogram examinations, most                  recent 11/26/2021. CHF, COPD, Arrythmias:Atrial Fibrillation;                  Risk Factors:Hypertension and Dyslipidemia.  Sonographer:     Sherrie Sport Referring Phys:  [id number] [place] Diagnosing Phys: Ida Rogue MD  Sonographer Comments: Suboptimal parasternal window. Global longitudinal strain was attempted. IMPRESSIONS  1. Left ventricular ejection fraction, by estimation, is 60 to 65%. The left ventricle has normal function. The left ventricle has no regional wall motion abnormalities. Left ventricular diastolic parameters are consistent with Grade I diastolic dysfunction (impaired relaxation). The average left ventricular global longitudinal strain is -17.0 %.  2. Right ventricular systolic function is normal. The right ventricular size is normal. There is normal pulmonary artery systolic pressure. The estimated right ventricular systolic pressure is 78.5 mmHg.  3. Left atrial size was mildly dilated.  4. The mitral valve is normal in structure. Mild mitral valve regurgitation. No evidence of mitral stenosis.  5. The aortic valve was not well visualized. Aortic valve regurgitation is not visualized. No aortic stenosis is present.  6. The inferior vena cava is normal in size with greater than 50% respiratory variability, suggesting right atrial pressure of 3 mmHg. FINDINGS  Left Ventricle: Left ventricular ejection fraction, by  estimation, is 60 to 65%. The left ventricle has normal function. The left ventricle has no regional wall motion abnormalities. The average left ventricular global longitudinal strain is -17.0 %. The left ventricular internal cavity size was normal in size. There is no left ventricular hypertrophy. Left ventricular diastolic parameters are consistent with Grade I diastolic dysfunction (impaired relaxation). Right Ventricle: The right ventricular size is normal. No increase in right ventricular wall thickness. Right ventricular systolic function is normal. There is normal pulmonary artery systolic pressure. The tricuspid regurgitant velocity is 2.57 m/s, and  with an assumed right atrial pressure of 5 mmHg, the estimated right ventricular systolic pressure is 88.5 mmHg. Left Atrium: Left atrial size was mildly dilated. Right Atrium: Right atrial size was normal in size. Pericardium: There is no evidence of pericardial effusion. Mitral Valve: The mitral valve is normal in structure. Mild mitral annular calcification. Mild mitral valve regurgitation. No evidence of  mitral valve stenosis. Tricuspid Valve: The tricuspid valve is normal in structure. Tricuspid valve regurgitation is not demonstrated. No evidence of tricuspid stenosis. Aortic Valve: The aortic valve was not well visualized. Aortic valve regurgitation is not visualized. No aortic stenosis is present. Aortic valve mean gradient measures 4.0 mmHg. Aortic valve peak gradient measures 7.4 mmHg. Aortic valve area, by VTI measures 1.83 cm. Pulmonic Valve: The pulmonic valve was normal in structure. Pulmonic valve regurgitation is not visualized. No evidence of pulmonic stenosis. Aorta: The aortic root is normal in size and structure. Venous: The inferior vena cava is normal in size with greater than 50% respiratory variability, suggesting right atrial pressure of 3 mmHg. IAS/Shunts: No atrial level shunt detected by color flow Doppler.  LEFT VENTRICLE PLAX 2D  LVIDd:         5.10 cm   Diastology LVIDs:         3.30 cm   LV e' medial:    5.22 cm/s LV PW:         1.40 cm   LV E/e' medial:  16.2 LV IVS:        1.20 cm   LV e' lateral:   8.92 cm/s LVOT diam:     1.90 cm   LV E/e' lateral: 9.5 LV SV:         60 LV SV Index:   27        2D Longitudinal Strain LVOT Area:     2.84 cm  2D Strain GLS Avg:     -17.0 %                           3D Volume EF:                          3D EF:        55 %                          LV EDV:       145 ml                          LV ESV:       66 ml                          LV SV:        79 ml RIGHT VENTRICLE RV Basal diam:  4.30 cm RV S prime:     11.70 cm/s TAPSE (M-mode): 2.2 cm LEFT ATRIUM             Index        RIGHT ATRIUM           Index LA diam:        3.90 cm 1.76 cm/m   RA Area:     24.80 cm LA Vol (A2C):   69.4 ml 31.41 ml/m  RA Volume:   77.50 ml  35.07 ml/m LA Vol (A4C):   71.1 ml 32.18 ml/m LA Biplane Vol: 73.2 ml 33.13 ml/m  AORTIC VALVE AV Area (Vmax):    1.60 cm AV Area (Vmean):   1.69 cm AV Area (VTI):     1.83 cm AV Vmax:           136.33 cm/s AV Vmean:  93.433 cm/s AV VTI:            0.329 m AV Peak Grad:      7.4 mmHg AV Mean Grad:      4.0 mmHg LVOT Vmax:         77.00 cm/s LVOT Vmean:        55.700 cm/s LVOT VTI:          0.213 m LVOT/AV VTI ratio: 0.65  AORTA Ao Root diam: 3.43 cm MITRAL VALVE               TRICUSPID VALVE MV Area (PHT): 4.10 cm    TR Peak grad:   26.4 mmHg MV Decel Time: 185 msec    TR Vmax:        257.00 cm/s MV E velocity: 84.80 cm/s MV A velocity: 72.80 cm/s  SHUNTS MV E/A ratio:  1.16        Systemic VTI:  0.21 m                            Systemic Diam: 1.90 cm Ida Rogue MD Electronically signed by Ida Rogue MD Signature Date/Time: 02/25/2022/11:03:41 AM    Final

## 2022-03-02 NOTE — Assessment & Plan Note (Signed)
#  Right breast invasive carcinoma pT1b pN0 ER negative, PR weakly positive HER 2 positive.  Patient does not tolerate adjuvant chemotherapy with Taxol and trastuzumab despite Taxol dose reduction.-->Status post adjuvant radiation--> maintenance Trastuzumab Labs are reviewed and discussed with patient. Proceed with trastuzumab maintenance. August 2023 Echo showed stable LVEF Obtain right diagnostic mammogram in December 2023

## 2022-03-03 ENCOUNTER — Ambulatory Visit
Admission: RE | Admit: 2022-03-03 | Discharge: 2022-03-03 | Disposition: A | Discharge status: Home / Self Care | Payer: Medicare Other | Source: Ambulatory Visit | Attending: Family | Admitting: Family

## 2022-03-03 DIAGNOSIS — R222 Localized swelling, mass and lump, trunk: Secondary | ICD-10-CM | POA: Diagnosis not present

## 2022-03-03 DIAGNOSIS — I2510 Atherosclerotic heart disease of native coronary artery without angina pectoris: Secondary | ICD-10-CM | POA: Insufficient documentation

## 2022-03-03 DIAGNOSIS — R918 Other nonspecific abnormal finding of lung field: Secondary | ICD-10-CM | POA: Diagnosis not present

## 2022-03-03 DIAGNOSIS — M7989 Other specified soft tissue disorders: Secondary | ICD-10-CM | POA: Insufficient documentation

## 2022-03-03 DIAGNOSIS — Z853 Personal history of malignant neoplasm of breast: Secondary | ICD-10-CM | POA: Diagnosis not present

## 2022-03-09 ENCOUNTER — Ambulatory Visit: Payer: Medicare Other | Admitting: Family

## 2022-03-09 ENCOUNTER — Other Ambulatory Visit: Payer: Self-pay | Admitting: Family

## 2022-03-09 ENCOUNTER — Encounter: Payer: Self-pay | Admitting: Family

## 2022-03-09 DIAGNOSIS — I509 Heart failure, unspecified: Secondary | ICD-10-CM | POA: Diagnosis not present

## 2022-03-09 DIAGNOSIS — G629 Polyneuropathy, unspecified: Secondary | ICD-10-CM | POA: Diagnosis not present

## 2022-03-09 DIAGNOSIS — J449 Chronic obstructive pulmonary disease, unspecified: Secondary | ICD-10-CM | POA: Diagnosis not present

## 2022-03-09 DIAGNOSIS — M1712 Unilateral primary osteoarthritis, left knee: Secondary | ICD-10-CM | POA: Diagnosis not present

## 2022-03-09 DIAGNOSIS — R9389 Abnormal findings on diagnostic imaging of other specified body structures: Secondary | ICD-10-CM

## 2022-03-09 DIAGNOSIS — C50911 Malignant neoplasm of unspecified site of right female breast: Secondary | ICD-10-CM | POA: Diagnosis not present

## 2022-03-11 ENCOUNTER — Ambulatory Visit (INDEPENDENT_AMBULATORY_CARE_PROVIDER_SITE_OTHER): Payer: Medicare Other | Admitting: Family

## 2022-03-11 ENCOUNTER — Encounter: Payer: Self-pay | Admitting: Family

## 2022-03-11 VITALS — BP 132/78 | HR 56 | Temp 97.8°F | Ht 65.0 in | Wt 257.4 lb

## 2022-03-11 DIAGNOSIS — R9389 Abnormal findings on diagnostic imaging of other specified body structures: Secondary | ICD-10-CM | POA: Diagnosis not present

## 2022-03-11 DIAGNOSIS — M7989 Other specified soft tissue disorders: Secondary | ICD-10-CM

## 2022-03-11 DIAGNOSIS — Z6841 Body Mass Index (BMI) 40.0 and over, adult: Secondary | ICD-10-CM | POA: Diagnosis not present

## 2022-03-11 LAB — LIPID PANEL
Cholesterol: 192 mg/dL (ref 0–200)
HDL: 60.5 mg/dL (ref >39.00)
LDL Cholesterol: 109 mg/dL — ABNORMAL HIGH (ref 0–99)
NonHDL: 131.42
Total CHOL/HDL Ratio: 3
Triglycerides: 114 mg/dL (ref 0.0–149.0)
VLDL: 22.8 mg/dL (ref 0.0–40.0)

## 2022-03-11 MED ORDER — SAXENDA 18 MG/3ML ~~LOC~~ SOPN: 0.6000 mg | PEN_INJECTOR | Freq: Every day | SUBCUTANEOUS | 2 refills | Status: DC

## 2022-03-11 NOTE — Progress Notes (Signed)
Subjective:    Patient ID: Amber Barnes, female    DOB: 09/25/1950, 71 y.o.   MRN: [id number]  CC: Amber Barnes is a 71 y.o. female who presents today for follow up.   HPI: Left upper back mass is unchanged.   Area is nontender   She was unable to get wegovy due to backorder. She is eating healthy, swimming as well as walking.   She remains interested in weight loss medication  CT chest 03/04/2022 no focal pulmonary infiltrates.  Possible small cyst in the left kidney.  No focal abnormalities noted in the subcutaneous plane left upper back.  No significant lymphadenopathy.  Coronary artery calcification HISTORY:  Past Medical History:  Diagnosis Date  . Achilles tendinitis   . Acute medial meniscal tear   . Allergy   . Anxiety   . Atrial fibrillation ([place])    a. new onset 07/2016; b. CHADS2VASc --> 4 (HTN, female, age, CHF); c. eliquis  . CHF (congestive heart failure) ([place])   . Chronic anticoagulation    Apixaban  . Chronic lower back pain   . Colon polyp   . Complication of anesthesia    Emergence delirium; hypoxia  . COPD (chronic obstructive pulmonary disease) ([place])   . Cystic breast   . Depression   . Endometriosis   . Fibrocystic breast disease   . GERD (gastroesophageal reflux disease)   . History of stress test    a. 07/2016 MV: EF 55-65%. Small, mild defect  in mid anteroseptal, apical septal, and apical locations. No ischemia-->low risk.  Marland Kitchen Hyperlipidemia   . Hypertension   . Insomnia   . Lipoma   . Malignant neoplasm of upper-outer quadrant of right female breast ([place])    Stage 1A invasive mammary carcinoma (cT1b or T1c N0); ER (-), PR (+), HER2/neu (+)  . Migraine   . Mild Mitral regurgitation    a. 07/2016 Echo: EF 55-65%, no rwma, mild MR. Mildly dil LA. Nl RV fx.   . Nephrolithiasis   . OSA (obstructive sleep apnea)    non-compliant with nocturnal PAP therapy  . Osteoarthritis    left knee  . Osteopenia   . Peptic ulcer disease    a. H.  pylori  . Pneumonia   . RLS (restless legs syndrome)   . Tendonitis   . Thyroid disease   . Vitamin D deficiency    Past Surgical History:  Procedure Laterality Date  . ABDOMINAL HYSTERECTOMY     ovaries left  . ACHILLES TENDON SURGERY     2012,01/2017  . BACK SURGERY     fusion lumbar  . BREAST BIOPSY Right 01/15/2021   Affirm biopsy-"X" clip-INVASIVE MAMMARY CARCINOMA  . BREAST BIOPSY Right 01/15/2021   u/s bx-"venus" clip INVASIVE MAMMARY CARCINOMA  . BREAST BIOPSY    . BREAST LUMPECTOMY,RADIO FREQ LOCALIZER,AXILLARY SENTINEL LYMPH NODE BIOPSY Right 02/17/2021   Procedure: BREAST LUMPECTOMY,RADIO FREQ LOCALIZER,AXILLARY SENTINEL LYMPH NODE BIOPSY;  Surgeon: Ronny Bacon, MD;  Location: [facility];  Service: General;  Laterality: Right;  . CARDIOVERSION N/A 09/07/2016   Procedure: CARDIOVERSION;  Surgeon: Minna Merritts, MD;  Location: [facility];  Service: Cardiovascular;  Laterality: N/A;  . CARDIOVERSION N/A 10/19/2016   Procedure: CARDIOVERSION;  Surgeon: Minna Merritts, MD;  Location: [facility];  Service: Cardiovascular;  Laterality: N/A;  . CARDIOVERSION N/A 12/14/2016   Procedure: CARDIOVERSION;  Surgeon: Minna Merritts, MD;  Location: [facility];  Service: Cardiovascular;  Laterality: N/A;  . COLONOSCOPY    .  COLONOSCOPY WITH PROPOFOL N/A 05/13/2020   Procedure: COLONOSCOPY WITH PROPOFOL;  Surgeon: Toledo, Benay Pike, MD;  Location: [facility];  Service: Gastroenterology;  Laterality: N/A;  ELIQUIS  . CYSTOSCOPY WITH STENT PLACEMENT Bilateral 07/12/2021   Procedure: CYSTOSCOPY WITH STENT PLACEMENT;  Surgeon: Billey Co, MD;  Location: [facility];  Service: Urology;  Laterality: Bilateral;  . CYSTOSCOPY/URETEROSCOPY/HOLMIUM LASER/STENT PLACEMENT Bilateral 07/30/2021   Procedure: CYSTOSCOPY/URETEROSCOPY/HOLMIUM LASER/BILATERAL STENT REMOVAL;  Surgeon: Billey Co, MD;  Location: [facility];  Service: Urology;  Laterality: Bilateral;  . ESOPHAGOGASTRODUODENOSCOPY     . ESOPHAGOGASTRODUODENOSCOPY (EGD) WITH PROPOFOL N/A 05/13/2020   Procedure: ESOPHAGOGASTRODUODENOSCOPY (EGD) WITH PROPOFOL;  Surgeon: Toledo, Benay Pike, MD;  Location: [facility];  Service: Gastroenterology;  Laterality: N/A;  . FOOT SURGERY Left    tendon  . JOINT REPLACEMENT Right    hip  . PORTA CATH INSERTION N/A 03/23/2021   Procedure: PORTA CATH INSERTION;  Surgeon: Katha Cabal, MD;  Location: [facility];  Service: Cardiovascular;  Laterality: N/A;  . PORTACATH PLACEMENT N/A 03/23/2021   Procedure: ATTEMPTED INSERTION PORT-A-CATH;  Surgeon: Ronny Bacon, MD;  Location: [facility];  Service: General;  Laterality: N/A;  . THYROIDECTOMY     thyroid lobectomy secondary to a benign nodule  . TOTAL HIP ARTHROPLASTY Right 08/21/2018   Procedure: TOTAL HIP ARTHROPLASTY ANTERIOR APPROACH-RIGHT CELL SAVER REQUESTED;  Surgeon: [name], MD;  Location: [facility];  Service: Orthopedics;  Laterality: Right;   Family History  Problem Relation Age of Onset  . Cancer Mother        leukemia  . Cancer Father 20       colon  . Heart disease Father   . Heart attack Father 44       died from MI  . Hyperlipidemia Father   . Hypertension Father   . Cancer Sister 26       ovarian  . Depression Sister   . Cancer Brother        CLL  . Breast cancer Paternal Aunt        75's  . Migraines Neg Hx   . Thyroid cancer Neg Hx     Allergies: Prednisone, Amiodarone, Hydrocodone-acetaminophen, Oxycodone, Tapentadol, Tramadol, Adhesive [tape], and Latex Current Outpatient Medications on File Prior to Visit  Medication Sig Dispense Refill  . acetaminophen (TYLENOL) 500 MG tablet Take 500-1,000 mg by mouth every 6 (six) hours as needed for mild pain or moderate pain.    Marland Kitchen ALPRAZolam (XANAX) 0.25 MG tablet TAKE 1 TABLET BY MOUTH 2 TIMES DAILY AS NEEDED FOR ANXIETY. 60 tablet 2  . apixaban (ELIQUIS) 5 MG TABS tablet TAKE 1 TABLET BY MOUTH TWICE A DAY 180 tablet 1  . azelastine  (ASTELIN) 0.1 % nasal spray Place 1-2 sprays into both nostrils daily as needed for rhinitis.    Marland Kitchen bisoprolol (ZEBETA) 5 MG tablet Take 5 mg by mouth in the morning and at bedtime.    . busPIRone (BUSPAR) 5 MG tablet Take 1 tablet (5 mg total) by mouth 3 (three) times daily as needed. 60 tablet 1  . Cholecalciferol (VITAMIN D3) 5000 units CAPS Take 5,000 Units by mouth daily.    . clotrimazole (LOTRIMIN) 1 % cream Apply 1 application topically 2 (two) times daily. 30 g 0  . diphenhydrAMINE (BENADRYL) 25 mg capsule Take 1 capsule (25 mg total) by mouth every 8 (eight) hours as needed for itching or allergies. 30 capsule 0  . esomeprazole (NEXIUM) 20 MG capsule  Take 20 mg by mouth daily as needed (GERD symptoms).    Marland Kitchen FLUoxetine (PROZAC) 20 MG capsule Take 1 capsule (20 mg total) by mouth every morning. 90 capsule 3  . furosemide (LASIX) 20 MG tablet TAKE 1 TABLET BY MOUTH TWICE A DAY 180 tablet 0  . gabapentin (NEURONTIN) 100 MG capsule Take 1 capsule (100 mg total) by mouth 3 (three) times daily. 270 capsule 0  . hydrocortisone 2.5 % ointment Apply topically 2 (two) times daily. prn 60 g 2  . KLOR-CON M20 20 MEQ tablet TAKE 1 TABLET BY MOUTH EVERY DAY 90 tablet 1  . loperamide (IMODIUM) 2 MG capsule Take 1 capsule (2 mg total) by mouth See admin instructions. Initial: 4 mg, followed by 2 mg after each loose stool; maximum: 16 mg/day 60 capsule 1  . meclizine (ANTIVERT) 12.5 MG tablet Take 1 tablet (12.5 mg total) by mouth 3 (three) times daily as needed for dizziness. 30 tablet 0  . Multiple Vitamin (MULTIVITAMIN) tablet Take 1 tablet by mouth daily.    Marland Kitchen nystatin (MYCOSTATIN/NYSTOP) powder Apply 1 application. topically 3 (three) times daily. 60 g 11  . nystatin cream (MYCOSTATIN) Apply 1 application. topically 2 (two) times daily. Prn abdomen 60 g 5  . ondansetron (ZOFRAN) 8 MG tablet Take 1 tablet (8 mg total) by mouth every 8 (eight) hours as needed for nausea or vomiting. 60 tablet 0  .  promethazine (PHENERGAN) 25 MG tablet Take 1 tablet (25 mg total) by mouth every 6 (six) hours as needed for nausea or vomiting. 60 tablet 0  . ranolazine (RANEXA) 500 MG 12 hr tablet TAKE 1 TABLET BY MOUTH TWICE A DAY 180 tablet 1  . rizatriptan (MAXALT) 10 MG tablet TAKE 1 TABLET (10 MG TOTAL) BY MOUTH AS NEEDED FOR MIGRAINE. MAY REPEAT IN 2 HOURS IF NEEDED 10 tablet 0  . rOPINIRole (REQUIP) 2 MG tablet TAKE 1 TABLET BY MOUTH AT BEDTIME. 90 tablet 1  . rosuvastatin (CRESTOR) 20 MG tablet TAKE 1 TABLET BY MOUTH EVERY DAY 90 tablet 0  . silver sulfADIAZINE (SILVADENE) 1 % cream Apply 1 application. topically 2 (two) times daily. 50 g 3  . [DISCONTINUED] prochlorperazine (COMPAZINE) 10 MG tablet Take 1 tablet (10 mg total) by mouth every 6 (six) hours as needed (Nausea or vomiting). 30 tablet 1   No current facility-administered medications on file prior to visit.    Social History   Tobacco Use  . Smoking status: Former    Packs/day: 0.25    Years: 10.00    Total pack years: 2.50    Types: Cigarettes    Quit date: 2005    Years since quitting: 18.6  . Smokeless tobacco: Never  . Tobacco comments:    smoked for 10 years, 1 pack every 2-3 days  Vaping Use  . Vaping Use: Never used  Substance Use Topics  . Alcohol use: Not Currently    Alcohol/week: 1.0 standard drink of alcohol    Types: 1 Glasses of wine per week    Comment: rarely  . Drug use: No    Review of Systems  Constitutional:  Negative for chills and fever.  Respiratory:  Negative for cough.   Cardiovascular:  Negative for chest pain and palpitations.  Gastrointestinal:  Negative for nausea and vomiting.      Objective:    BP 132/78 (BP Location: Left Arm, Patient Position: Sitting, Cuff Size: Normal)   Pulse (!) 56   Temp 97.8 F (36.6  C) (Oral)   Ht 5' 5"  (1.651 m)   Wt 257 lb 6.4 oz (116.8 kg)   SpO2 96%   BMI 42.83 kg/m  BP Readings from Last 3 Encounters:  03/11/22 132/78  03/02/22 (!) 141/93   02/09/22 (!) 156/83   Wt Readings from Last 3 Encounters:  03/11/22 257 lb 6.4 oz (116.8 kg)  03/02/22 258 lb (117 kg)  02/09/22 259 lb 8 oz (117.7 kg)    Physical Exam Vitals reviewed.  Constitutional:      Appearance: She is well-developed.  Eyes:     Conjunctiva/sclera: Conjunctivae normal.  Cardiovascular:     Rate and Rhythm: Normal rate and regular rhythm.     Pulses: Normal pulses.     Heart sounds: Normal heart sounds.  Pulmonary:     Effort: Pulmonary effort is normal.     Breath sounds: Normal breath sounds. No wheezing, rhonchi or rales.  Skin:    General: Skin is warm and dry.  Neurological:     Mental Status: She is alert.  Psychiatric:        Speech: Speech normal.        Behavior: Behavior normal.        Thought Content: Thought content normal.       Assessment & Plan:   Problem List Items Addressed This Visit       Other   Morbid obesity with BMI of 50.0-59.9, adult ([place]) - Primary (Chronic)    Congratulated patient on lifestyle measures to lose weight.  Wegovy on backorder.  Trial of Saxenda      Relevant Medications   Liraglutide -Weight Management (SAXENDA) 18 MG/3ML SOPN   Soft tissue mass (Chronic)    CT chest overall unrevealing.  I offered a second opinion with general surgery and patient politely declines.  She feels comfortable  to watch the area and certainly  if mass were to enlarge or become tender, she will let me know.  Of note: She understands renal ultrasound is ordered for left kidney cyst for further characterization.       Other Visit Diagnoses     Abnormal CT of the chest            I have discontinued Saya Mccoll "Jackie"'s fluconazole and Wegovy. I am also having her start on Saxenda. Additionally, I am having her maintain her multivitamin, Vitamin D3, rizatriptan, acetaminophen, esomeprazole, azelastine, ondansetron, promethazine, loperamide, diphenhydrAMINE, clotrimazole, bisoprolol, furosemide, silver  sulfADIAZINE, nystatin cream, nystatin, hydrocortisone, ranolazine, [place]-Con M20, gabapentin, FLUoxetine, meclizine, rOPINIRole, rosuvastatin, Eliquis, ALPRAZolam, and busPIRone.   Meds ordered this encounter  Medications  . Liraglutide -Weight Management (SAXENDA) 18 MG/3ML SOPN    Sig: Inject 0.6 mg into the skin daily.    Dispense:  3 mL    Refill:  2    Order Specific Question:   Supervising Provider    Answer:   Crecencio Mc [[id number]]    Return precautions given.   Risks, benefits, and alternatives of the medications and treatment plan prescribed today were discussed, and patient expressed understanding.   Education regarding symptom management and diagnosis given to patient on AVS.  Continue to follow with [name], FNP for routine health maintenance.   [name] and I agreed with plan.   [name], FNP

## 2022-03-11 NOTE — Assessment & Plan Note (Addendum)
CT chest overall unrevealing.  I offered a second opinion with general surgery and patient politely declines.  She feels comfortable  to watch the area and certainly  if mass were to enlarge or become tender, she will let me know.  Of note: She understands renal ultrasound is ordered for left kidney cyst for further characterization.

## 2022-03-11 NOTE — Assessment & Plan Note (Signed)
Congratulated patient on lifestyle measures to lose weight.  Wegovy on backorder.  Trial of Saxenda

## 2022-03-11 NOTE — Patient Instructions (Signed)
Start Saxenda 0.'6mg'$  subcutaneously ([place]) ; you may take this injection daily and will gradually increase as tolerated to maximum dose of '3mg'$  injected subcutaneous daily. You do not have to increase each week, you can go weeks without increasing.  If you do increase, you do this by increasing dose 0.'6mg'$  once per week.  For example: Week one : 0.'6mg'$  [place] daily Week two: 1.'2mg'$  [place] daily  Week three: 1.8 mg [place] daily Week four: 2.'4mg'$  [place] daily Week five: '3mg'$   daily AND STAY THERE.  Remember you may NOT take this medication if ANY personal or family history of thyroid cancer so please let us know asap if this history were to change.   Nice to see you!

## 2022-03-15 ENCOUNTER — Other Ambulatory Visit: Payer: Self-pay | Admitting: Oncology

## 2022-03-15 ENCOUNTER — Telehealth: Payer: Self-pay | Admitting: Family

## 2022-03-15 NOTE — Telephone Encounter (Signed)
Lft pt vm to call [phone] press option 3 and then 2 to sch . thanks 

## 2022-03-17 ENCOUNTER — Other Ambulatory Visit: Payer: Self-pay | Admitting: Cardiovascular Disease

## 2022-03-17 MED ORDER — FUROSEMIDE 20 MG PO TABS: 20.0000 mg | ORAL_TABLET | Freq: Two times a day (BID) | ORAL | 0 refills | Status: DC

## 2022-03-18 ENCOUNTER — Other Ambulatory Visit: Payer: Self-pay

## 2022-03-18 MED ORDER — ROSUVASTATIN CALCIUM 40 MG PO TABS: 40.0000 mg | ORAL_TABLET | Freq: Every day | ORAL | 3 refills | Status: DC

## 2022-03-18 NOTE — Progress Notes (Signed)
[facility]

## 2022-03-23 ENCOUNTER — Encounter: Payer: Self-pay | Admitting: Oncology

## 2022-03-23 ENCOUNTER — Inpatient Hospital Stay (HOSPITAL_BASED_OUTPATIENT_CLINIC_OR_DEPARTMENT_OTHER): Payer: Medicare Other | Admitting: Oncology

## 2022-03-23 ENCOUNTER — Inpatient Hospital Stay: Payer: Medicare Other | Attending: Oncology

## 2022-03-23 ENCOUNTER — Inpatient Hospital Stay: Payer: Medicare Other

## 2022-03-23 VITALS — BP 127/76 | HR 40 | Temp 96.6°F | Resp 20 | Wt 265.4 lb

## 2022-03-23 DIAGNOSIS — I509 Heart failure, unspecified: Secondary | ICD-10-CM | POA: Insufficient documentation

## 2022-03-23 DIAGNOSIS — G629 Polyneuropathy, unspecified: Secondary | ICD-10-CM | POA: Insufficient documentation

## 2022-03-23 DIAGNOSIS — Z800 Family history of malignant neoplasm of digestive organs: Secondary | ICD-10-CM | POA: Insufficient documentation

## 2022-03-23 DIAGNOSIS — M8580 Other specified disorders of bone density and structure, unspecified site: Secondary | ICD-10-CM | POA: Insufficient documentation

## 2022-03-23 DIAGNOSIS — C50919 Malignant neoplasm of unspecified site of unspecified female breast: Secondary | ICD-10-CM

## 2022-03-23 DIAGNOSIS — J449 Chronic obstructive pulmonary disease, unspecified: Secondary | ICD-10-CM | POA: Insufficient documentation

## 2022-03-23 DIAGNOSIS — Z803 Family history of malignant neoplasm of breast: Secondary | ICD-10-CM | POA: Diagnosis not present

## 2022-03-23 DIAGNOSIS — Z5112 Encounter for antineoplastic immunotherapy: Secondary | ICD-10-CM

## 2022-03-23 DIAGNOSIS — Z806 Family history of leukemia: Secondary | ICD-10-CM | POA: Diagnosis not present

## 2022-03-23 DIAGNOSIS — Z87891 Personal history of nicotine dependence: Secondary | ICD-10-CM | POA: Diagnosis not present

## 2022-03-23 DIAGNOSIS — C50411 Malignant neoplasm of upper-outer quadrant of right female breast: Secondary | ICD-10-CM

## 2022-03-23 DIAGNOSIS — Z171 Estrogen receptor negative status [ER-]: Secondary | ICD-10-CM

## 2022-03-23 DIAGNOSIS — E785 Hyperlipidemia, unspecified: Secondary | ICD-10-CM | POA: Insufficient documentation

## 2022-03-23 DIAGNOSIS — E559 Vitamin D deficiency, unspecified: Secondary | ICD-10-CM | POA: Insufficient documentation

## 2022-03-23 DIAGNOSIS — I4891 Unspecified atrial fibrillation: Secondary | ICD-10-CM | POA: Insufficient documentation

## 2022-03-23 DIAGNOSIS — K219 Gastro-esophageal reflux disease without esophagitis: Secondary | ICD-10-CM | POA: Insufficient documentation

## 2022-03-23 DIAGNOSIS — I10 Essential (primary) hypertension: Secondary | ICD-10-CM | POA: Diagnosis not present

## 2022-03-23 DIAGNOSIS — Z79899 Other long term (current) drug therapy: Secondary | ICD-10-CM | POA: Diagnosis not present

## 2022-03-23 DIAGNOSIS — M1990 Unspecified osteoarthritis, unspecified site: Secondary | ICD-10-CM | POA: Diagnosis not present

## 2022-03-23 LAB — CBC WITH DIFFERENTIAL/PLATELET
Abs Immature Granulocytes: 0.01 10*3/uL (ref 0.00–0.07)
Basophils Absolute: 0 10*3/uL (ref 0.0–0.1)
Basophils Relative: 0 %
Eosinophils Absolute: 0.1 10*3/uL (ref 0.0–0.5)
Eosinophils Relative: 1 %
HCT: 38.3 % (ref 36.0–46.0)
Hemoglobin: 12.7 g/dL (ref 12.0–15.0)
Immature Granulocytes: 0 %
Lymphocytes Relative: 23 %
Lymphs Abs: 1.4 10*3/uL (ref 0.7–4.0)
MCH: 31.5 pg (ref 26.0–34.0)
MCHC: 33.2 g/dL (ref 30.0–36.0)
MCV: 95 fL (ref 80.0–100.0)
Monocytes Absolute: 0.4 10*3/uL (ref 0.1–1.0)
Monocytes Relative: 6 %
Neutro Abs: 4.2 10*3/uL (ref 1.7–7.7)
Neutrophils Relative %: 70 %
Platelets: 183 10*3/uL (ref 150–400)
RBC: 4.03 MIL/uL (ref 3.87–5.11)
RDW: 13.2 % (ref 11.5–15.5)
WBC: 6 10*3/uL (ref 4.0–10.5)
nRBC: 0 % (ref 0.0–0.2)

## 2022-03-23 LAB — COMPREHENSIVE METABOLIC PANEL
ALT: 14 U/L (ref 0–44)
AST: 19 U/L (ref 15–41)
Albumin: 3.8 g/dL (ref 3.5–5.0)
Alkaline Phosphatase: 69 U/L (ref 38–126)
Anion gap: 8 (ref 5–15)
BUN: 22 mg/dL (ref 8–23)
CO2: 26 mmol/L (ref 22–32)
Calcium: 8.3 mg/dL — ABNORMAL LOW (ref 8.9–10.3)
Chloride: 106 mmol/L (ref 98–111)
Creatinine, Ser: 0.94 mg/dL (ref 0.44–1.00)
GFR, Estimated: >60 mL/min (ref >60)
Glucose, Bld: 95 mg/dL (ref 70–99)
Potassium: 3.6 mmol/L (ref 3.5–5.1)
Sodium: 140 mmol/L (ref 135–145)
Total Bilirubin: 0.6 mg/dL (ref 0.3–1.2)
Total Protein: 6.9 g/dL (ref 6.5–8.1)

## 2022-03-23 MED ORDER — ACETAMINOPHEN 325 MG PO TABS
650.0000 mg | ORAL_TABLET | Freq: Once | ORAL | Status: AC
  Administered 2022-03-23: 650 mg via ORAL
  Filled 2022-03-23: qty 2

## 2022-03-23 MED ORDER — DIPHENHYDRAMINE HCL 25 MG PO CAPS
50.0000 mg | ORAL_CAPSULE | Freq: Once | ORAL | Status: AC
  Administered 2022-03-23: 50 mg via ORAL
  Filled 2022-03-23: qty 2

## 2022-03-23 MED ORDER — TRASTUZUMAB-ANNS CHEMO 150 MG IV SOLR
6.0000 mg/kg | Freq: Once | INTRAVENOUS | Status: AC
  Administered 2022-03-23: 714 mg via INTRAVENOUS
  Filled 2022-03-23: qty 34

## 2022-03-23 MED ORDER — SODIUM CHLORIDE 0.9 % IV SOLN
Freq: Once | INTRAVENOUS | Status: AC
  Filled 2022-03-23: qty 250

## 2022-03-23 MED ORDER — HEPARIN SOD (PORK) LOCK FLUSH 100 UNIT/ML IV SOLN
500.0000 [IU] | Freq: Once | INTRAVENOUS | Status: AC | PRN
  Administered 2022-03-23: 500 [IU]
  Filled 2022-03-23: qty 5

## 2022-03-23 MED ORDER — TRASTUZUMAB-ANNS CHEMO 150 MG IV SOLR: 6.0000 mg/kg | Freq: Once | INTRAVENOUS | Status: DC

## 2022-03-23 NOTE — Assessment & Plan Note (Signed)
#  Right breast invasive carcinoma pT1b pN0 ER negative, PR weakly positive HER 2 positive.  Patient does not tolerate adjuvant chemotherapy with Taxol and trastuzumab despite Taxol dose reduction.-->Status post adjuvant radiation--> maintenance Trastuzumab Labs are reviewed and discussed with patient. Proceed with trastuzumab maintenance. August 2023 Echo showed stable LVEF Obtain right diagnostic mammogram in December 2023

## 2022-03-23 NOTE — Assessment & Plan Note (Signed)
Recommend patient to take calcium 1500-1800 mg daily

## 2022-03-23 NOTE — Assessment & Plan Note (Signed)
Follow-up with cardiology.  Patient is on Eliquis 5 mg twice daily.

## 2022-03-23 NOTE — Progress Notes (Addendum)
Hematology/Oncology Progress note [phone] [phone]      ASSESSMENT & PLAN:   Cancer Staging  Malignant neoplasm of upper-outer quadrant of right female breast [facility]) Staging form: Breast, AJCC 8th Edition - Pathologic stage from 03/03/2021: Stage IA (pT1b, pN0, cM0, G3, ER-, PR+, HER2+) - Signed by [name], MD on 03/03/2021   Malignant neoplasm of upper-outer quadrant of right female breast [facility]) #Right breast invasive carcinoma pT1b pN0 ER negative, PR weakly positive HER 2 positive.  Patient does not tolerate adjuvant chemotherapy with Taxol and trastuzumab despite Taxol dose reduction.-->Status post adjuvant radiation--> maintenance Trastuzumab Labs are reviewed and discussed with patient. Proceed with trastuzumab maintenance. 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  She may increase dose of 200 mg at bedtime.   Encounter for monoclonal antibody treatment for malignancy Treatment plan as needed.   Atrial fibrillation [facility]) Follow-up with cardiology.  Patient is on Eliquis 5 mg twice daily.  Hypocalcemia Recommend patient to take calcium 1500-1800 mg daily  Orders Placed This Encounter  Procedures   Comprehensive metabolic panel    Standing Status:   Future    Standing Expiration Date:   03/24/2023   CBC with Differential    Standing Status:   Future    Standing Expiration Date:   03/23/2023   Comprehensive metabolic panel    Standing Status:   Future    Standing Expiration Date:   04/14/2023   CBC with Differential    Standing Status:   Future    Standing Expiration Date:   04/13/2023   Comprehensive metabolic panel    Standing Status:   Future    Standing Expiration Date:   05/05/2023   CBC with Differential    Standing Status:   Future    Standing  Expiration Date:   05/04/2023   Comprehensive metabolic panel    Standing Status:   Future    Standing Expiration Date:   05/26/2023   CBC with Differential    Standing Status:   Future    Standing Expiration Date:   05/25/2023   Comprehensive metabolic panel    Standing Status:   Future    Standing Expiration Date:   06/16/2023   CBC with Differential    Standing Status:   Future    Standing Expiration Date:   06/15/2023    Follow up  3 weeks lab MD trastuzumab.   All questions were answered. The patient knows to call the clinic with any problems, questions or concerns.  [name], MD, PhD [facility] Hematology Oncology 03/23/2022   CHIEF COMPLAINTS/REASON FOR VISIT:  Follow-up for stage I HER2 positive breast cancer  HISTORY OF PRESENTING ILLNESS:   Amber Barnes is a  71 y.o.  female presents for follow up of right breast cancer.  Oncology History  Malignant neoplasm of upper-outer quadrant of right female breast ([place])  01/19/2021 Initial Diagnosis   Breast cancer in female  -12/30/2020, patient had a screening mammogram done which showed possible mass and asymmetry in the right breast. 01/08/2021, diagnostic mammogram of the right breast showed 2 adjacent irregular spiculated masses.  Larger 1 was 10 mm and a smaller mass measured 5 mm. 01/15/2021 stereotactic biopsy of the right breast smaller mass showed invasive carcinoma no special type, grade 2, DCIS present, LVI negative.Ultrasound guided biopsy of the larger mass showed  invasive mammary carcinoma  02/17/2021, patient is status post lumpectomy and sentinel lymph node biopsy Pathology showed invasive mammary carcinoma, 2 separate foci, DCIS, negative sentinel lymph node biopsy 0/1, grade 3, or margins negative for invasive carcinoma.  ER -, PR + [1-10%], HER2 +  pT1b(m) pN0   Strong family history of cancer, Discussed again about genetic testing. patient adamantly declined.   03/03/2021 Cancer Staging   Staging form:  Breast, AJCC 8th Edition - Pathologic stage from 03/03/2021: Stage IA (pT1b, pN0, cM0, G3, ER-, PR+, HER2+) - Signed by [name], MD on 03/03/2021 Stage prefix: Initial diagnosis Multigene prognostic tests performed: None Histologic grading system: 3 grade system   03/08/2021 Echocardiogram   echocardiogram showed LVEF 60-65%. Per patient, she has been seen by Dr. [name] team and was cleared for proceeding with chemotherapy.    02/2021 Genetic Testing   Strong family history of cancer, Discussed again about genetic testing. patient adamantly declined.   03/23/2021 Procedure   Mediport by vascular surgeon   03/24/2021 -  Chemotherapy   03/24/2021 - 03/31/2021 Taxol 80 mg/m2 [name] 04/05/2021 - 04/09/2021 patient was admitted due to neutropenic fever with associated SIRS.  Patient was treated with empiric cefepime and vancomycin until date of discharge when [place] recovered to 1.4. Post hospitalization, patient also had COVID-19 infection so her follow-up appointment was further postponed for another 2 weeks.  05/10/2021 resumed dose reduce Taxol 21m/m2 /[name]11/15/2022-06/30/2021, cycle 2 dose reduced Taxol 624mm2 /trastuzumab   admitted from 07/12/2021 - 07/17/2021 due to acute pyelonephritis UVJ obstruction and a right hydro ureteralnephrosis.  Patient was treated with IV antibiotics status post emergent bilateral ureteral stent placement.  Discharged home with a course of oral antibiotics.  Shared decision made to hold chemotherapy and proceed with radiation.    08/18/2021 - 09/08/2021 Radiation Therapy    adjuvant breast radiation   10/06/2021 -  Chemotherapy   10/06/2021, shared decision was made to proceed with maintenance trastuzumab.  Her cardiologist Dr. GoRockey Situave clearance to proceed. Plan Transtuzumab for up to a year if tolerates.    12/14/2021 Mammogram   Bilateral diagnostic mammogram 1. Probably benign right breast calcifications along the posterior lumpectomy bed felt to  represent early changes of fat necrosis.Recommend short-term imaging follow-up. 2. No mammographic evidence of malignancy on the left.   03/02/2022 Echocardiogram   LVEF 6072-62%grade I diastolic dysfuction   HEMBT5-HRCBULAGarcinoma of breast (HCC)    Left butt/thigh tenderness for a few weeks, ultrasound soft tissue left lower extremity negative.   INTERVAL HISTORY Amber Barnes a [age].o. female who has above history reviewed by me today presents for follow up visit for management of right side HER 2 positive breast cancer Patient has no new complaints.  Denies any shortness of breath, chest pain.. she had a fall in the swimming pool recently. No injury.  Intermittently in A fib. She follows up with cardiology  Review of Systems  Constitutional:  Positive for fatigue. Negative for appetite change, chills, fever and unexpected weight change.       Patient sits in the wheelchair  HENT:   Negative for hearing loss and voice change.   Respiratory:  Negative for chest tightness and cough.   Cardiovascular:  Negative for chest pain and leg swelling.  Gastrointestinal:  Negative for abdominal distention, abdominal pain, blood in stool, nausea and vomiting.  Endocrine: Negative for hot flashes.  Genitourinary:  Negative for difficulty urinating, frequency and hematuria.   Musculoskeletal:  Positive  for arthralgias.  Skin:  Negative for itching and rash.  Neurological:  Positive for numbness. Negative for dizziness and extremity weakness.  Hematological:  Negative for adenopathy.  Psychiatric/Behavioral:  Negative for confusion.     MEDICAL HISTORY:  Past Medical History:  Diagnosis Date   Achilles tendinitis    Acute medial meniscal tear    Allergy    Anxiety    Atrial fibrillation ([place])    a. new onset 07/2016; b. CHADS2VASc --> 4 (HTN, female, age, CHF); c. eliquis   CHF (congestive heart failure) (HCC)    Chronic anticoagulation    Apixaban   Chronic lower back pain     Colon polyp    Complication of anesthesia    Emergence delirium; hypoxia   COPD (chronic obstructive pulmonary disease) ([place])    Cystic breast    Depression    Endometriosis    Fibrocystic breast disease    GERD (gastroesophageal reflux disease)    History of stress test    a. 07/2016 MV: EF 55-65%. Small, mild defect  in mid anteroseptal, apical septal, and apical locations. No ischemia-->low risk.   Hyperlipidemia    Hypertension    Insomnia    Lipoma    Malignant neoplasm of upper-outer quadrant of right female breast (HCC)    Stage 1A invasive mammary carcinoma (cT1b or T1c N0); ER (-), PR (+), HER2/neu (+)   Migraine    Mild Mitral regurgitation    a. 07/2016 Echo: EF 55-65%, no rwma, mild MR. Mildly dil LA. Nl RV fx.    Nephrolithiasis    OSA (obstructive sleep apnea)    non-compliant with nocturnal PAP therapy   Osteoarthritis    left knee   Osteopenia    Peptic ulcer disease    a. H. pylori   Pneumonia    RLS (restless legs syndrome)    Tendonitis    Thyroid disease    Vitamin D deficiency     SURGICAL HISTORY: Past Surgical History:  Procedure Laterality Date   ABDOMINAL HYSTERECTOMY     ovaries left   ACHILLES TENDON SURGERY     2012,01/2017   BACK SURGERY     fusion lumbar   BREAST BIOPSY Right 01/15/2021   Affirm biopsy-"X" clip-INVASIVE MAMMARY CARCINOMA   BREAST BIOPSY Right 01/15/2021   u/s bx-"venus" clip INVASIVE MAMMARY CARCINOMA   BREAST BIOPSY     BREAST LUMPECTOMY,RADIO FREQ LOCALIZER,AXILLARY SENTINEL LYMPH NODE BIOPSY Right 02/17/2021   Procedure: BREAST LUMPECTOMY,RADIO FREQ LOCALIZER,AXILLARY SENTINEL LYMPH NODE BIOPSY;  Surgeon: Ronny Bacon, MD;  Location: [facility];  Service: General;  Laterality: Right;   CARDIOVERSION N/A 09/07/2016   Procedure: CARDIOVERSION;  Surgeon: Minna Merritts, MD;  Location: [facility];  Service: Cardiovascular;  Laterality: N/A;   CARDIOVERSION N/A 10/19/2016   Procedure: CARDIOVERSION;  Surgeon: Minna Merritts, MD;  Location: [facility];  Service: Cardiovascular;  Laterality: N/A;   CARDIOVERSION N/A 12/14/2016   Procedure: CARDIOVERSION;  Surgeon: Minna Merritts, MD;  Location: [facility];  Service: Cardiovascular;  Laterality: N/A;   COLONOSCOPY     COLONOSCOPY WITH PROPOFOL N/A 05/13/2020   Procedure: COLONOSCOPY WITH PROPOFOL;  Surgeon: Toledo, Benay Pike, MD;  Location: [facility];  Service: Gastroenterology;  Laterality: N/A;  ELIQUIS   CYSTOSCOPY WITH STENT PLACEMENT Bilateral 07/12/2021   Procedure: CYSTOSCOPY WITH STENT PLACEMENT;  Surgeon: Billey Co, MD;  Location: [facility];  Service: Urology;  Laterality: Bilateral;   CYSTOSCOPY/URETEROSCOPY/HOLMIUM LASER/STENT PLACEMENT Bilateral 07/30/2021   Procedure:  CYSTOSCOPY/URETEROSCOPY/HOLMIUM LASER/BILATERAL STENT REMOVAL;  Surgeon: Billey Co, MD;  Location: [facility];  Service: Urology;  Laterality: Bilateral;   ESOPHAGOGASTRODUODENOSCOPY     ESOPHAGOGASTRODUODENOSCOPY (EGD) WITH PROPOFOL N/A 05/13/2020   Procedure: ESOPHAGOGASTRODUODENOSCOPY (EGD) WITH PROPOFOL;  Surgeon: Toledo, Benay Pike, MD;  Location: [facility];  Service: Gastroenterology;  Laterality: N/A;   FOOT SURGERY Left    tendon   JOINT REPLACEMENT Right    hip   PORTA CATH INSERTION N/A 03/23/2021   Procedure: PORTA CATH INSERTION;  Surgeon: Katha Cabal, MD;  Location: [place] [facility];  Service: Cardiovascular;  Laterality: N/A;   PORTACATH PLACEMENT N/A 03/23/2021   Procedure: ATTEMPTED INSERTION PORT-A-CATH;  Surgeon: Ronny Bacon, MD;  Location: [facility];  Service: General;  Laterality: N/A;   THYROIDECTOMY     thyroid lobectomy secondary to a benign nodule   TOTAL HIP ARTHROPLASTY Right 08/21/2018   Procedure: TOTAL HIP ARTHROPLASTY ANTERIOR APPROACH-RIGHT CELL SAVER REQUESTED;  Surgeon: [name], MD;  Location: [facility];  Service: Orthopedics;  Laterality: Right;    SOCIAL HISTORY: Social History   Socioeconomic History    Marital status: Married    Spouse name: jeffrey   Number of children: 1   Years of education: Not on file   Highest education level: High school graduate  Occupational History    Comment: disabled  Tobacco Use   Smoking status: Former    Packs/day: 0.25    Years: 10.00    Total pack years: 2.50    Types: Cigarettes    Quit date: 2005    Years since quitting: 18.7   Smokeless tobacco: Never   Tobacco comments:    smoked for 10 years, 1 pack every 2-3 days  Vaping Use   Vaping Use: Never used  Substance and Sexual Activity   Alcohol use: Not Currently    Alcohol/week: 1.0 standard drink of alcohol    Types: 1 Glasses of wine per week    Comment: rarely   Drug use: No   Sexual activity: Not Currently  Other Topics Concern   Not on file  Social History Narrative   Moved to [place] from [place], originally from [place].    1 cup of caffeine daily   Right handed   Lives with husband, step-daughter and child       Social Determinants of Health   Financial Resource Strain: Low Risk  (12/09/2021)   Overall Financial Resource Strain (CARDIA)    Difficulty of Paying Living Expenses: Not very hard  Food Insecurity: No Food Insecurity (12/09/2021)   Hunger Vital Sign    Worried About Running Out of Food in the Last Year: Never true    Ran Out of Food in the Last Year: Never true  Transportation Needs: No Transportation Needs (12/09/2021)   PRAPARE - Hydrologist (Medical): No    Lack of Transportation (Non-Medical): No  Physical Activity: Insufficiently Active (12/09/2021)   Exercise Vital Sign    Days of Exercise per Week: 7 days    Minutes of Exercise per Session: 20 min  Stress: Stress Concern Present (12/09/2021)   [place]    Feeling of Stress : Very much  Social Connections: Moderately Isolated (12/09/2021)   Social Connection and Isolation Panel [NHANES]     Frequency of Communication with Friends and Family: Never    Frequency of Social Gatherings with Friends and Family: Once a week  Attends Religious Services: More than 4 times per year    Active Member of Clubs or Organizations: No    Attends Archivist Meetings: Never    Marital Status: Married  Human resources officer Violence: Not At Risk (12/09/2021)   Humiliation, Afraid, Rape, and Kick questionnaire    Fear of Current or Ex-Partner: No    Emotionally Abused: No    Physically Abused: No    Sexually Abused: No    FAMILY HISTORY: Family History  Problem Relation Age of Onset   Cancer Mother        leukemia   Cancer Father 27       colon   Heart disease Father    Heart attack Father 40       died from MI   Hyperlipidemia Father    Hypertension Father    Cancer Sister 16       ovarian   Depression Sister    Cancer Brother        CLL   Breast cancer Paternal Aunt        4's   Migraines Neg Hx    Thyroid cancer Neg Hx     ALLERGIES:  is allergic to prednisone, amiodarone, hydrocodone-acetaminophen, oxycodone, tapentadol, tramadol, adhesive [tape], and latex.  MEDICATIONS:  Current Outpatient Medications  Medication Sig Dispense Refill   acetaminophen (TYLENOL) 500 MG tablet Take 500-1,000 mg by mouth every 6 (six) hours as needed for mild pain or moderate pain.     ALPRAZolam (XANAX) 0.25 MG tablet TAKE 1 TABLET BY MOUTH 2 TIMES DAILY AS NEEDED FOR ANXIETY. 60 tablet 2   apixaban (ELIQUIS) 5 MG TABS tablet TAKE 1 TABLET BY MOUTH TWICE A DAY 180 tablet 1   azelastine (ASTELIN) 0.1 % nasal spray Place 1-2 sprays into both nostrils daily as needed for rhinitis.     bisoprolol (ZEBETA) 5 MG tablet Take 5 mg by mouth in the morning and at bedtime.     busPIRone (BUSPAR) 5 MG tablet Take 1 tablet (5 mg total) by mouth 3 (three) times daily as needed. 60 tablet 1   Cholecalciferol (VITAMIN D3) 5000 units CAPS Take 5,000 Units by mouth daily.     clotrimazole (LOTRIMIN) 1  % cream Apply 1 application topically 2 (two) times daily. 30 g 0   diphenhydrAMINE (BENADRYL) 25 mg capsule Take 1 capsule (25 mg total) by mouth every 8 (eight) hours as needed for itching or allergies. 30 capsule 0   esomeprazole (NEXIUM) 20 MG capsule Take 20 mg by mouth daily as needed (GERD symptoms).     FLUoxetine (PROZAC) 20 MG capsule Take 1 capsule (20 mg total) by mouth every morning. 90 capsule 3   furosemide (LASIX) 20 MG tablet Take 1 tablet (20 mg total) by mouth 2 (two) times daily. 180 tablet 0   gabapentin (NEURONTIN) 100 MG capsule TAKE 1 CAPSULE (100 MG TOTAL) BY MOUTH THREE TIMES DAILY. 270 capsule 0   hydrocortisone 2.5 % ointment Apply topically 2 (two) times daily. prn 60 g 2   KLOR-CON M20 20 MEQ tablet TAKE 1 TABLET BY MOUTH EVERY DAY 90 tablet 1   Liraglutide -Weight Management (SAXENDA) 18 MG/3ML SOPN Inject 0.6 mg into the skin daily. 3 mL 2   loperamide (IMODIUM) 2 MG capsule Take 1 capsule (2 mg total) by mouth See admin instructions. Initial: 4 mg, followed by 2 mg after each loose stool; maximum: 16 mg/day 60 capsule 1   meclizine (ANTIVERT) 12.5 MG  tablet Take 1 tablet (12.5 mg total) by mouth 3 (three) times daily as needed for dizziness. 30 tablet 0   Multiple Vitamin (MULTIVITAMIN) tablet Take 1 tablet by mouth daily.     nystatin (MYCOSTATIN/NYSTOP) powder Apply 1 application. topically 3 (three) times daily. 60 g 11   nystatin cream (MYCOSTATIN) Apply 1 application. topically 2 (two) times daily. Prn abdomen 60 g 5   ondansetron (ZOFRAN) 8 MG tablet Take 1 tablet (8 mg total) by mouth every 8 (eight) hours as needed for nausea or vomiting. 60 tablet 0   promethazine (PHENERGAN) 25 MG tablet Take 1 tablet (25 mg total) by mouth every 6 (six) hours as needed for nausea or vomiting. 60 tablet 0   ranolazine (RANEXA) 500 MG 12 hr tablet TAKE 1 TABLET BY MOUTH TWICE A DAY 180 tablet 1   rizatriptan (MAXALT) 10 MG tablet TAKE 1 TABLET (10 MG TOTAL) BY MOUTH AS  NEEDED FOR MIGRAINE. MAY REPEAT IN 2 HOURS IF NEEDED 10 tablet 0   rOPINIRole (REQUIP) 2 MG tablet TAKE 1 TABLET BY MOUTH AT BEDTIME. 90 tablet 1   rosuvastatin (CRESTOR) 20 MG tablet TAKE 1 TABLET BY MOUTH EVERY DAY 90 tablet 0   rosuvastatin (CRESTOR) 40 MG tablet Take 1 tablet (40 mg total) by mouth daily. 90 tablet 3   silver sulfADIAZINE (SILVADENE) 1 % cream Apply 1 application. topically 2 (two) times daily. 50 g 3   No current facility-administered medications for this visit.     PHYSICAL EXAMINATION: ECOG PERFORMANCE STATUS: 1 - Symptomatic but completely ambulatory Vitals:   03/23/22 0849  BP: 127/76  Pulse: (!) 40  Resp: 20  Temp: (!) 96.6 F (35.9 C)  SpO2: 99%    Filed Weights   03/23/22 0849  Weight: 265 lb 6.4 oz (120.4 kg)     Physical Exam Constitutional:      General: She is not in acute distress.    Appearance: She is obese.  HENT:     Head: Normocephalic and atraumatic.  Eyes:     General: No scleral icterus. Cardiovascular:     Rate and Rhythm: Normal rate and regular rhythm.     Heart sounds: Normal heart sounds.  Pulmonary:     Effort: Pulmonary effort is normal. No respiratory distress.     Breath sounds: No wheezing.  Abdominal:     General: Bowel sounds are normal. There is no distension.     Palpations: Abdomen is soft.  Musculoskeletal:        General: No deformity. Normal range of motion.     Cervical back: Normal range of motion and neck supple.     Comments: 1+ edema bilaterally in lower extremity Small left scapular soft tissue mass,   Skin:    General: Skin is warm and dry.     Findings: No erythema or rash.  Neurological:     Mental Status: She is alert and oriented to person, place, and time. Mental status is at baseline.     Cranial Nerves: No cranial nerve deficit.     Coordination: Coordination normal.  Psychiatric:        Mood and Affect: Mood normal.     . LABORATORY DATA:  I have reviewed the data as listed     Latest Ref Rng & Units 03/23/2022    8:33 AM 03/02/2022    8:55 AM 02/09/2022    8:49 AM  CBC  WBC 4.0 - 10.5 K/uL 6.0  4.9  4.7   Hemoglobin 12.0 - 15.0 g/dL 12.7  12.6  12.4   Hematocrit 36.0 - 46.0 % 38.3  37.9  37.0   Platelets 150 - 400 K/uL 183  165  152       Latest Ref Rng & Units 03/23/2022    8:33 AM 03/02/2022    8:55 AM 02/09/2022    8:49 AM  CMP  Glucose 70 - 99 mg/dL 95  92  97   BUN 8 - 23 mg/dL 22  26  19    Creatinine 0.44 - 1.00 mg/dL 0.94  0.98  1.00   Sodium 135 - 145 mmol/L 140  137  139   Potassium 3.5 - 5.1 mmol/L 3.6  3.9  3.8   Chloride 98 - 111 mmol/L 106  101  102   CO2 22 - 32 mmol/L 26  26  28    Calcium 8.9 - 10.3 mg/dL 8.3  8.9  8.9   Total Protein 6.5 - 8.1 g/dL 6.9  7.2  7.1   Total Bilirubin 0.3 - 1.2 mg/dL 0.6  0.8  0.7   Alkaline Phos 38 - 126 U/L 69  77  77   AST 15 - 41 U/L 19  19  24    ALT 0 - 44 U/L 14  13  17        RADIOGRAPHIC STUDIES: I have personally reviewed the radiological images as listed and agreed with the findings in the report. CT Chest Wo Contrast  Result Date: 03/04/2022 CLINICAL DATA:  Mass in the left upper back, evaluate mediastinum EXAM: CT CHEST WITHOUT CONTRAST TECHNIQUE: Multidetector CT imaging of the chest was performed following the standard protocol without IV contrast. RADIATION DOSE REDUCTION: This exam was performed according to the departmental dose-optimization program which includes automated exposure control, adjustment of the mA and/or kV according to patient size and/or use of iterative reconstruction technique. COMPARISON:  07/02/2021 FINDINGS: Cardiovascular: There are scattered coronary artery calcifications. There are scattered calcifications seen thoracic aorta and its major branches. Dense calcification is seen in the region of mitral annulus. Tip of left chest port is seen in superior vena cava. Mediastinum/Nodes: No significant lymphadenopathy is seen. Lungs/Pleura: There are no focal pulmonary infiltrates.  Small faint ground-glass densities in the lateral aspect of right mid lung field have not changed, possibly scarring. There is no pleural effusion or pneumothorax. Upper Abdomen: No acute findings are seen. Possible small cyst is seen in the midportion of left kidney. Musculoskeletal: No acute findings are seen. No focal abnormalities are noted in subcutaneous plane in the left upper back. IMPRESSION: No significant lymphadenopathy is seen in mediastinum. Coronary artery calcifications are seen. There is no focal pulmonary consolidation. There is no pleural effusion or pneumothorax. Electronically Signed   By: Elmer Picker M.D.   On: 03/04/2022 18:59   ECHOCARDIOGRAM COMPLETE  Result Date: 02/25/2022    ECHOCARDIOGRAM REPORT   Patient Name:   Amber Barnes Date of Exam: 02/25/2022 Medical Rec #:  [id number]        Height:       65.0 in Accession #:    [id number]       Weight:       259.5 lb Date of Birth:  1951/05/27        BSA:          2.210 m Patient Age:    22 years         BP:  156/83 mmHg Patient Gender: F                HR:           52 bpm. Exam Location:  [facility] Procedure: 2D Echo, Cardiac Doppler, Color Doppler and Strain Analysis Indications:     Chemo Z09  History:         Patient has prior history of Echocardiogram examinations, most                  recent 11/26/2021. CHF, COPD, Arrythmias:Atrial Fibrillation;                  Risk Factors:Hypertension and Dyslipidemia.  Sonographer:     Sherrie Sport Referring Phys:  [id number] [place] Diagnosing Phys: Ida Rogue MD  Sonographer Comments: Suboptimal parasternal window. Global longitudinal strain was attempted. IMPRESSIONS  1. Left ventricular ejection fraction, by estimation, is 60 to 65%. The left ventricle has normal function. The left ventricle has no regional wall motion abnormalities. Left ventricular diastolic parameters are consistent with Grade I diastolic dysfunction (impaired relaxation). The average left ventricular global  longitudinal strain is -17.0 %.  2. Right ventricular systolic function is normal. The right ventricular size is normal. There is normal pulmonary artery systolic pressure. The estimated right ventricular systolic pressure is 24.2 mmHg.  3. Left atrial size was mildly dilated.  4. The mitral valve is normal in structure. Mild mitral valve regurgitation. No evidence of mitral stenosis.  5. The aortic valve was not well visualized. Aortic valve regurgitation is not visualized. No aortic stenosis is present.  6. The inferior vena cava is normal in size with greater than 50% respiratory variability, suggesting right atrial pressure of 3 mmHg. FINDINGS  Left Ventricle: Left ventricular ejection fraction, by estimation, is 60 to 65%. The left ventricle has normal function. The left ventricle has no regional wall motion abnormalities. The average left ventricular global longitudinal strain is -17.0 %. The left ventricular internal cavity size was normal in size. There is no left ventricular hypertrophy. Left ventricular diastolic parameters are consistent with Grade I diastolic dysfunction (impaired relaxation). Right Ventricle: The right ventricular size is normal. No increase in right ventricular wall thickness. Right ventricular systolic function is normal. There is normal pulmonary artery systolic pressure. The tricuspid regurgitant velocity is 2.57 m/s, and  with an assumed right atrial pressure of 5 mmHg, the estimated right ventricular systolic pressure is 35.3 mmHg. Left Atrium: Left atrial size was mildly dilated. Right Atrium: Right atrial size was normal in size. Pericardium: There is no evidence of pericardial effusion. Mitral Valve: The mitral valve is normal in structure. Mild mitral annular calcification. Mild mitral valve regurgitation. No evidence of mitral valve stenosis. Tricuspid Valve: The tricuspid valve is normal in structure. Tricuspid valve regurgitation is not demonstrated. No evidence of  tricuspid stenosis. Aortic Valve: The aortic valve was not well visualized. Aortic valve regurgitation is not visualized. No aortic stenosis is present. Aortic valve mean gradient measures 4.0 mmHg. Aortic valve peak gradient measures 7.4 mmHg. Aortic valve area, by VTI measures 1.83 cm. Pulmonic Valve: The pulmonic valve was normal in structure. Pulmonic valve regurgitation is not visualized. No evidence of pulmonic stenosis. Aorta: The aortic root is normal in size and structure. Venous: The inferior vena cava is normal in size with greater than 50% respiratory variability, suggesting right atrial pressure of 3 mmHg. IAS/Shunts: No atrial level shunt detected by color flow Doppler.  LEFT VENTRICLE PLAX 2D LVIDd:  5.10 cm   Diastology LVIDs:         3.30 cm   LV e' medial:    5.22 cm/s LV PW:         1.40 cm   LV E/e' medial:  16.2 LV IVS:        1.20 cm   LV e' lateral:   8.92 cm/s LVOT diam:     1.90 cm   LV E/e' lateral: 9.5 LV SV:         60 LV SV Index:   27        2D Longitudinal Strain LVOT Area:     2.84 cm  2D Strain GLS Avg:     -17.0 %                           3D Volume EF:                          3D EF:        55 %                          LV EDV:       145 ml                          LV ESV:       66 ml                          LV SV:        79 ml RIGHT VENTRICLE RV Basal diam:  4.30 cm RV S prime:     11.70 cm/s TAPSE (M-mode): 2.2 cm LEFT ATRIUM             Index        RIGHT ATRIUM           Index LA diam:        3.90 cm 1.76 cm/m   RA Area:     24.80 cm LA Vol (A2C):   69.4 ml 31.41 ml/m  RA Volume:   77.50 ml  35.07 ml/m LA Vol (A4C):   71.1 ml 32.18 ml/m LA Biplane Vol: 73.2 ml 33.13 ml/m  AORTIC VALVE AV Area (Vmax):    1.60 cm AV Area (Vmean):   1.69 cm AV Area (VTI):     1.83 cm AV Vmax:           136.33 cm/s AV Vmean:          93.433 cm/s AV VTI:            0.329 m AV Peak Grad:      7.4 mmHg AV Mean Grad:      4.0 mmHg LVOT Vmax:         77.00 cm/s LVOT Vmean:         55.700 cm/s LVOT VTI:          0.213 m LVOT/AV VTI ratio: 0.65  AORTA Ao Root diam: 3.43 cm MITRAL VALVE               TRICUSPID VALVE MV Area (PHT): 4.10 cm    TR Peak grad:   26.4 mmHg MV Decel Time: 185 msec    TR Vmax:        257.00 cm/s MV  E velocity: 84.80 cm/s MV A velocity: 72.80 cm/s  SHUNTS MV E/A ratio:  1.16        Systemic VTI:  0.21 m                            Systemic Diam: 1.90 cm Ida Rogue MD Electronically signed by Ida Rogue MD Signature Date/Time: 02/25/2022/11:03:41 AM    Final

## 2022-03-23 NOTE — Assessment & Plan Note (Signed)
Treatment plan as needed.  

## 2022-03-23 NOTE — Assessment & Plan Note (Signed)
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  She may increase dose of 200 mg at bedtime.  

## 2022-03-23 NOTE — Patient Instructions (Signed)
[facility]  Discharge Instructions: Thank you for choosing [facility] to provide your oncology and hematology care.  If you have a lab appointment with the Cancer Center, please go directly to the Cancer Center and check in at the registration area.  Wear comfortable clothing and clothing appropriate for easy access to any Portacath or PICC line.   We strive to give you quality time with your provider. You may need to reschedule your appointment if you arrive late (15 or more minutes).  Arriving late affects you and other patients whose appointments are after yours.  Also, if you miss three or more appointments without notifying the office, you may be dismissed from the clinic at the provider's discretion.      For prescription refill requests, have your pharmacy contact our office and allow 72 hours for refills to be completed.    Today you received the following chemotherapy and/or immunotherapy agents Kanjinti   To help prevent nausea and vomiting after your treatment, we encourage you to take your nausea medication as directed.  BELOW ARE SYMPTOMS THAT SHOULD BE REPORTED IMMEDIATELY: *FEVER GREATER THAN 100.4 F (38 C) OR HIGHER *CHILLS OR SWEATING *NAUSEA AND VOMITING THAT IS NOT CONTROLLED WITH YOUR NAUSEA MEDICATION *UNUSUAL SHORTNESS OF BREATH *UNUSUAL BRUISING OR BLEEDING *URINARY PROBLEMS (pain or burning when urinating, or frequent urination) *BOWEL PROBLEMS (unusual diarrhea, constipation, pain near the anus) TENDERNESS IN MOUTH AND THROAT WITH OR WITHOUT PRESENCE OF ULCERS (sore throat, sores in mouth, or a toothache) UNUSUAL RASH, SWELLING OR PAIN  UNUSUAL VAGINAL DISCHARGE OR ITCHING   Items with * indicate a potential emergency and should be followed up as soon as possible or go to the Emergency Department if any problems should occur.  Please show the CHEMOTHERAPY ALERT CARD or IMMUNOTHERAPY ALERT CARD at check-in to the  Emergency Department and triage nurse.  Should you have questions after your visit or need to cancel or reschedule your appointment, please contact [facility]  [phone] and follow the prompts.  Office hours are 8:00 a.m. to 4:30 p.m. Monday - Friday. Please note that voicemails left after 4:00 p.m. may not be returned until the following business day.  We are closed weekends and major holidays. You have access to a nurse at all times for urgent questions. Please call the main number to the clinic [phone] and follow the prompts.  For any non-urgent questions, you may also contact your provider using MyChart. We now offer e-Visits for anyone 18 and older to request care online for non-urgent symptoms. For details visit [email or web address].   Also download the MyChart app! Go to the app store, search "MyChart", open the app, select [facility], and log in with your MyChart username and password.  Masks are optional in the cancer centers. If you would like for your care team to wear a mask while they are taking care of you, please let them know. For doctor visits, patients may have with them one support person who is at least 69 years old. At this time, visitors are not allowed in the infusion area.   

## 2022-03-28 ENCOUNTER — Ambulatory Visit
Admission: RE | Admit: 2022-03-28 | Discharge: 2022-03-28 | Disposition: A | Discharge status: Home / Self Care | Payer: Medicare Other | Source: Ambulatory Visit | Attending: Radiation Oncology | Admitting: Radiation Oncology

## 2022-03-28 VITALS — BP 164/79 | HR 69 | Ht 65.0 in | Wt 256.0 lb

## 2022-03-28 DIAGNOSIS — C50411 Malignant neoplasm of upper-outer quadrant of right female breast: Secondary | ICD-10-CM | POA: Diagnosis not present

## 2022-03-28 DIAGNOSIS — Z170 Estrogen receptor positive status [ER+]: Secondary | ICD-10-CM | POA: Diagnosis not present

## 2022-03-28 NOTE — Progress Notes (Signed)
Radiation Oncology Follow up Note  Name: Amber Barnes   Date:   03/28/2022 MRN:  [id number] DOB: December 15, 1950    This 71 y.o. 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  REFERRING PROVIDER: ABurnard Hawthorne FNP  HPI: Patient is a 71year old female now out 6 months having completed whole breast radiation to her right breast for stage Ia HER2/neu positive base of mammary carcinoma seen today in routine follow-up she is doing well.  She specifically denies breast.  Tenderness cough or bone pain.  Her tumor was ER negative PR weakly positive.  She completed Taxol and is on maintenance Trastuzumab which she is tolerating well.  She had a CT scan of her chest last month showing no significant lymphadenopathy in the mediastinum.  Her last mammogram which I have reviewed back in June showed some calcifications along the posterior lumpectomy bed probably fat necrosis.  Left breast was benign.  COMPLICATIONS OF TREATMENT: none  FOLLOW UP COMPLIANCE: keeps appointments   PHYSICAL EXAM:  BP (!) 164/79   Pulse 69   Ht 5' 5"  (1.651 m)   Wt 256 lb (116.1 kg)   BMI 42.60 kg/m  Lungs are clear to A&P cardiac examination essentially unremarkable with regular rate and rhythm. No dominant mass or nodularity is noted in either breast in 2 positions examined. Incision is well-healed. No axillary or supraclavicular adenopathy is appreciated. Cosmetic result is excellent.  Well-developed well-nourished patient in NAD. HEENT reveals PERLA, EOMI, discs not visualized.  Oral cavity is clear. No oral mucosal lesions are identified. Neck is clear without evidence of cervical or supraclavicular adenopathy. Lungs are clear to A&P. Cardiac examination is essentially unremarkable with regular rate and rhythm without murmur rub or thrill. Abdomen is benign with no organomegaly or  masses noted. Motor sensory and DTR levels are equal and symmetric in the upper and lower extremities. Cranial nerves II through XII are grossly intact. Proprioception is intact. No peripheral adenopathy or edema is identified. No motor or sensory levels are noted. Crude visual fields are within normal range.  RADIOLOGY RESULTS: Mammograms and CT scan reviewed compatible with above-stated findings  PLAN: Present time patient is doing well 6 months out with no evidence of disease.  She continues on her immune immunotherapy without significant side effect.  Therefore closely watching her right breast for possible fat necrosis.  I have asked to see her back in 6 months for follow-up.  Patient knows to call with any concerns.  I would like to take this opportunity to thank you for allowing me to participate in the care of your patient..Amber Filbert MD

## 2022-03-29 ENCOUNTER — Other Ambulatory Visit: Payer: Self-pay

## 2022-03-30 ENCOUNTER — Ambulatory Visit (INDEPENDENT_AMBULATORY_CARE_PROVIDER_SITE_OTHER): Payer: Medicare Other

## 2022-03-30 DIAGNOSIS — Z23 Encounter for immunization: Secondary | ICD-10-CM | POA: Diagnosis not present

## 2022-03-30 NOTE — Progress Notes (Addendum)
Pt arrived for flu shot, given in R deltoid. Pt tolerated injection well, showed no signs of distress nor voiced any concerns.   Pt was also asking to have pneumonia vaccine in ofc, she has had pneumo 13,23, & 20 with last dose being on 01/26/21. Advise & let your CMA know to reach out to pt if she is in need of another prevnar vx.   Reviewed PNA vaccines. She has completed series [name], NP

## 2022-04-09 DIAGNOSIS — J449 Chronic obstructive pulmonary disease, unspecified: Secondary | ICD-10-CM | POA: Diagnosis not present

## 2022-04-09 DIAGNOSIS — I509 Heart failure, unspecified: Secondary | ICD-10-CM | POA: Diagnosis not present

## 2022-04-09 DIAGNOSIS — M1712 Unilateral primary osteoarthritis, left knee: Secondary | ICD-10-CM | POA: Diagnosis not present

## 2022-04-09 DIAGNOSIS — C50911 Malignant neoplasm of unspecified site of right female breast: Secondary | ICD-10-CM | POA: Diagnosis not present

## 2022-04-09 DIAGNOSIS — G629 Polyneuropathy, unspecified: Secondary | ICD-10-CM | POA: Diagnosis not present

## 2022-04-13 ENCOUNTER — Other Ambulatory Visit: Payer: Self-pay

## 2022-04-13 ENCOUNTER — Inpatient Hospital Stay: Payer: Medicare Other

## 2022-04-13 ENCOUNTER — Encounter: Payer: Self-pay | Admitting: Oncology

## 2022-04-13 ENCOUNTER — Inpatient Hospital Stay: Payer: Medicare Other | Attending: Oncology | Admitting: Oncology

## 2022-04-13 DIAGNOSIS — Z9221 Personal history of antineoplastic chemotherapy: Secondary | ICD-10-CM | POA: Diagnosis not present

## 2022-04-13 DIAGNOSIS — Z1731 Human epidermal growth factor receptor 2 positive status: Secondary | ICD-10-CM

## 2022-04-13 DIAGNOSIS — M8580 Other specified disorders of bone density and structure, unspecified site: Secondary | ICD-10-CM | POA: Diagnosis not present

## 2022-04-13 DIAGNOSIS — Z8616 Personal history of COVID-19: Secondary | ICD-10-CM | POA: Diagnosis not present

## 2022-04-13 DIAGNOSIS — F17210 Nicotine dependence, cigarettes, uncomplicated: Secondary | ICD-10-CM | POA: Insufficient documentation

## 2022-04-13 DIAGNOSIS — K219 Gastro-esophageal reflux disease without esophagitis: Secondary | ICD-10-CM | POA: Diagnosis not present

## 2022-04-13 DIAGNOSIS — I340 Nonrheumatic mitral (valve) insufficiency: Secondary | ICD-10-CM | POA: Insufficient documentation

## 2022-04-13 DIAGNOSIS — Z5112 Encounter for antineoplastic immunotherapy: Secondary | ICD-10-CM | POA: Insufficient documentation

## 2022-04-13 DIAGNOSIS — M5412 Radiculopathy, cervical region: Secondary | ICD-10-CM | POA: Insufficient documentation

## 2022-04-13 DIAGNOSIS — C50411 Malignant neoplasm of upper-outer quadrant of right female breast: Secondary | ICD-10-CM | POA: Insufficient documentation

## 2022-04-13 DIAGNOSIS — E785 Hyperlipidemia, unspecified: Secondary | ICD-10-CM | POA: Diagnosis not present

## 2022-04-13 DIAGNOSIS — Z923 Personal history of irradiation: Secondary | ICD-10-CM | POA: Insufficient documentation

## 2022-04-13 DIAGNOSIS — J449 Chronic obstructive pulmonary disease, unspecified: Secondary | ICD-10-CM | POA: Diagnosis not present

## 2022-04-13 DIAGNOSIS — Z86010 Personal history of colonic polyps: Secondary | ICD-10-CM | POA: Insufficient documentation

## 2022-04-13 DIAGNOSIS — Z7901 Long term (current) use of anticoagulants: Secondary | ICD-10-CM | POA: Insufficient documentation

## 2022-04-13 DIAGNOSIS — G629 Polyneuropathy, unspecified: Secondary | ICD-10-CM | POA: Insufficient documentation

## 2022-04-13 DIAGNOSIS — I10 Essential (primary) hypertension: Secondary | ICD-10-CM | POA: Diagnosis not present

## 2022-04-13 DIAGNOSIS — T451X5A Adverse effect of antineoplastic and immunosuppressive drugs, initial encounter: Secondary | ICD-10-CM

## 2022-04-13 DIAGNOSIS — Z79899 Other long term (current) drug therapy: Secondary | ICD-10-CM | POA: Insufficient documentation

## 2022-04-13 DIAGNOSIS — C50919 Malignant neoplasm of unspecified site of unspecified female breast: Secondary | ICD-10-CM

## 2022-04-13 DIAGNOSIS — Z806 Family history of leukemia: Secondary | ICD-10-CM | POA: Diagnosis not present

## 2022-04-13 DIAGNOSIS — Z171 Estrogen receptor negative status [ER-]: Secondary | ICD-10-CM | POA: Diagnosis not present

## 2022-04-13 DIAGNOSIS — I4891 Unspecified atrial fibrillation: Secondary | ICD-10-CM | POA: Insufficient documentation

## 2022-04-13 DIAGNOSIS — E559 Vitamin D deficiency, unspecified: Secondary | ICD-10-CM | POA: Diagnosis not present

## 2022-04-13 DIAGNOSIS — G4700 Insomnia, unspecified: Secondary | ICD-10-CM | POA: Diagnosis not present

## 2022-04-13 DIAGNOSIS — I509 Heart failure, unspecified: Secondary | ICD-10-CM | POA: Diagnosis not present

## 2022-04-13 LAB — CBC WITH DIFFERENTIAL/PLATELET
Abs Immature Granulocytes: 0.01 10*3/uL (ref 0.00–0.07)
Basophils Absolute: 0 10*3/uL (ref 0.0–0.1)
Basophils Relative: 0 %
Eosinophils Absolute: 0.1 10*3/uL (ref 0.0–0.5)
Eosinophils Relative: 2 %
HCT: 37.7 % (ref 36.0–46.0)
Hemoglobin: 12.7 g/dL (ref 12.0–15.0)
Immature Granulocytes: 0 %
Lymphocytes Relative: 27 %
Lymphs Abs: 1.3 10*3/uL (ref 0.7–4.0)
MCH: 31 pg (ref 26.0–34.0)
MCHC: 33.7 g/dL (ref 30.0–36.0)
MCV: 92 fL (ref 80.0–100.0)
Monocytes Absolute: 0.3 10*3/uL (ref 0.1–1.0)
Monocytes Relative: 6 %
Neutro Abs: 3.1 10*3/uL (ref 1.7–7.7)
Neutrophils Relative %: 65 %
Platelets: 152 10*3/uL (ref 150–400)
RBC: 4.1 MIL/uL (ref 3.87–5.11)
RDW: 12.8 % (ref 11.5–15.5)
WBC: 4.7 10*3/uL (ref 4.0–10.5)
nRBC: 0 % (ref 0.0–0.2)

## 2022-04-13 LAB — COMPREHENSIVE METABOLIC PANEL
ALT: 15 U/L (ref 0–44)
AST: 22 U/L (ref 15–41)
Albumin: 3.8 g/dL (ref 3.5–5.0)
Alkaline Phosphatase: 76 U/L (ref 38–126)
Anion gap: 4 — ABNORMAL LOW (ref 5–15)
BUN: 16 mg/dL (ref 8–23)
CO2: 29 mmol/L (ref 22–32)
Calcium: 8.6 mg/dL — ABNORMAL LOW (ref 8.9–10.3)
Chloride: 105 mmol/L (ref 98–111)
Creatinine, Ser: 0.83 mg/dL (ref 0.44–1.00)
GFR, Estimated: >60 mL/min (ref >60)
Glucose, Bld: 88 mg/dL (ref 70–99)
Potassium: 3.9 mmol/L (ref 3.5–5.1)
Sodium: 138 mmol/L (ref 135–145)
Total Bilirubin: 0.8 mg/dL (ref 0.3–1.2)
Total Protein: 7.1 g/dL (ref 6.5–8.1)

## 2022-04-13 MED ORDER — ONDANSETRON HCL 8 MG PO TABS: 8.0000 mg | ORAL_TABLET | Freq: Three times a day (TID) | ORAL | 0 refills | Status: DC | PRN

## 2022-04-13 MED ORDER — SODIUM CHLORIDE 0.9 % IV SOLN
Freq: Once | INTRAVENOUS | Status: AC
  Filled 2022-04-13: qty 250

## 2022-04-13 MED ORDER — TRASTUZUMAB-ANNS CHEMO 150 MG IV SOLR
6.0000 mg/kg | Freq: Once | INTRAVENOUS | Status: AC
  Administered 2022-04-13: 693 mg via INTRAVENOUS
  Filled 2022-04-13: qty 33

## 2022-04-13 MED ORDER — SODIUM CHLORIDE 0.9% FLUSH
10.0000 mL | Freq: Once | INTRAVENOUS | Status: AC
  Administered 2022-04-13: 10 mL via INTRAVENOUS
  Filled 2022-04-13: qty 10

## 2022-04-13 MED ORDER — ACETAMINOPHEN 325 MG PO TABS
650.0000 mg | ORAL_TABLET | Freq: Once | ORAL | Status: AC
  Administered 2022-04-13: 650 mg via ORAL
  Filled 2022-04-13: qty 2

## 2022-04-13 MED ORDER — DIPHENHYDRAMINE HCL 25 MG PO CAPS
50.0000 mg | ORAL_CAPSULE | Freq: Once | ORAL | Status: AC
  Administered 2022-04-13: 50 mg via ORAL
  Filled 2022-04-13: qty 2

## 2022-04-13 MED ORDER — HEPARIN SOD (PORK) LOCK FLUSH 100 UNIT/ML IV SOLN
500.0000 [IU] | Freq: Once | INTRAVENOUS | Status: AC | PRN
  Administered 2022-04-13: 500 [IU]
  Filled 2022-04-13: qty 5

## 2022-04-13 NOTE — Assessment & Plan Note (Signed)
#  Right breast invasive carcinoma pT1b pN0 ER negative, PR weakly positive HER 2 positive.  Patient does not tolerate adjuvant chemotherapy with Taxol and trastuzumab despite Taxol dose reduction.-->Status post adjuvant radiation--> maintenance Trastuzumab Labs are reviewed and discussed with patient. Proceed with trastuzumab maintenance. August 2023 Echo showed stable LVEF Obtain right diagnostic mammogram in December 2023

## 2022-04-13 NOTE — Assessment & Plan Note (Signed)
Improved. Continue calcium 1500-1800 mg daily

## 2022-04-13 NOTE — Progress Notes (Signed)
Hematology/Oncology Progress note [phone] Fax:([phone]      ASSESSMENT & PLAN:   Cancer Staging  Malignant neoplasm of upper-outer quadrant of right female breast [facility]) Staging form: Breast, AJCC 8th Edition - Pathologic stage from 03/03/2021: Stage IA (pT1b, pN0, cM0, G3, ER-, PR+, HER2+) - Signed by [name], MD on 03/03/2021   Malignant neoplasm of upper-outer quadrant of right female breast [facility]) #Right breast invasive carcinoma pT1b pN0 ER negative, PR weakly positive HER 2 positive.  Patient does not tolerate adjuvant chemotherapy with Taxol and trastuzumab despite Taxol dose reduction.-->Status post adjuvant radiation--> maintenance Trastuzumab Labs are reviewed and discussed with patient. Proceed with trastuzumab maintenance. August 2023 Echo showed stable LVEF Obtain right diagnostic mammogram in December 2023  Encounter for monoclonal antibody treatment for malignancy Treatment plan as needed.   Neuropathy Pre-existing neuropathy in her fingertips bilaterally- secondary to cervical spine radiculopathy continue 100 mg 3 times daily.  She may increase dose of 200 mg at bedtime.   Hypocalcemia Improved. Continue calcium 1500-1800 mg daily  No orders of the defined types were placed in this encounter.   Follow up  3 weeks lab MD trastuzumab.   All questions were answered. The patient knows to call the clinic with any problems, questions or concerns.  [name], MD, PhD [facility] Hematology Oncology 04/13/2022   CHIEF COMPLAINTS/REASON FOR VISIT:  Follow-up for stage I HER2 positive breast cancer  HISTORY OF PRESENTING ILLNESS:   Amber Barnes is a  71 y.o.  female presents for follow up of right breast cancer.  Oncology History  Malignant neoplasm of upper-outer quadrant of right female breast ([place])  01/19/2021 Initial Diagnosis   Breast cancer in female  -12/30/2020, patient had a screening mammogram done which showed possible mass  and asymmetry in the right breast. 01/08/2021, diagnostic mammogram of the right breast showed 2 adjacent irregular spiculated masses.  Larger 1 was 10 mm and a smaller mass measured 5 mm. 01/15/2021 stereotactic biopsy of the right breast smaller mass showed invasive carcinoma no special type, grade 2, DCIS present, LVI negative.Ultrasound guided biopsy of the larger mass showed invasive mammary carcinoma  02/17/2021, patient is status post lumpectomy and sentinel lymph node biopsy Pathology showed invasive mammary carcinoma, 2 separate foci, DCIS, negative sentinel lymph node biopsy 0/1, grade 3, or margins negative for invasive carcinoma.  ER -, PR + [1-10%], HER2 +  pT1b(m) pN0   Strong family history of cancer, Discussed again about genetic testing. patient adamantly declined.   03/03/2021 Cancer Staging   Staging form: Breast, AJCC 8th Edition - Pathologic stage from 03/03/2021: Stage IA (pT1b, pN0, cM0, G3, ER-, PR+, HER2+) - Signed by [name], MD on 03/03/2021 Stage prefix: Initial diagnosis Multigene prognostic tests performed: None Histologic grading system: 3 grade system   03/08/2021 Echocardiogram   echocardiogram showed LVEF 60-65%. Per patient, she has been seen by Dr. [name] team and was cleared for proceeding with chemotherapy.    02/2021 Genetic Testing   Strong family history of cancer, Discussed again about genetic testing. patient adamantly declined.   03/23/2021 Procedure   Mediport by vascular surgeon   03/24/2021 -  Chemotherapy   03/24/2021 - 03/31/2021 Taxol 80 mg/m2 [name] 04/05/2021 - 04/09/2021 patient was admitted due to neutropenic fever with associated SIRS.  Patient was treated with empiric cefepime and vancomycin until date of discharge when [place] recovered to 1.4. Post hospitalization, patient also had COVID-19 infection so her follow-up appointment was further postponed for another  2 weeks.  05/10/2021 resumed dose reduce Taxol 10m/m2  /[name]11/15/2022-06/30/2021, cycle 2 dose reduced Taxol 68mm2 /trastuzumab   admitted from 07/12/2021 - 07/17/2021 due to acute pyelonephritis UVJ obstruction and a right hydro ureteralnephrosis.  Patient was treated with IV antibiotics status post emergent bilateral ureteral stent placement.  Discharged home with a course of oral antibiotics.  Shared decision made to hold chemotherapy and proceed with radiation.    08/18/2021 - 09/08/2021 Radiation Therapy    adjuvant breast radiation   10/06/2021 -  Chemotherapy   10/06/2021, shared decision was made to proceed with maintenance trastuzumab.  Her cardiologist Dr. GoRockey Situave clearance to proceed. Plan Transtuzumab for up to a year if tolerates.    12/14/2021 Mammogram   Bilateral diagnostic mammogram 1. Probably benign right breast calcifications along the posterior lumpectomy bed felt to represent early changes of fat necrosis.Recommend short-term imaging follow-up. 2. No mammographic evidence of malignancy on the left.   03/02/2022 Echocardiogram   LVEF 6060-10%grade I diastolic dysfuction   HEXNA3-FTDDUKGUarcinoma of breast (HCC)    Left butt/thigh tenderness for a few weeks, ultrasound soft tissue left lower extremity negative.   INTERVAL HISTORY Amber Barnes a [age].o. female who has above history reviewed by me today presents for follow up visit for management of right side HER 2 positive breast cancer Patient has occasional nausea episodes, spontaneously resolves.  She has not needed to take any antiemetics. She denies vomiting, abdominal pain.  No recent atrial fibrillation episodes.  No other new complaints.  Review of Systems  Constitutional:  Positive for fatigue. Negative for appetite change, chills, fever and unexpected weight change.       Patient sits in the wheelchair  HENT:   Negative for hearing loss and voice change.   Respiratory:  Negative for chest tightness and cough.   Cardiovascular:  Negative for  chest pain and leg swelling.  Gastrointestinal:  Negative for abdominal distention, abdominal pain, blood in stool, nausea and vomiting.  Endocrine: Negative for hot flashes.  Genitourinary:  Negative for difficulty urinating, frequency and hematuria.   Musculoskeletal:  Positive for arthralgias.  Skin:  Negative for itching and rash.  Neurological:  Positive for numbness. Negative for dizziness and extremity weakness.  Hematological:  Negative for adenopathy.  Psychiatric/Behavioral:  Negative for confusion.     MEDICAL HISTORY:  Past Medical History:  Diagnosis Date   Achilles tendinitis    Acute medial meniscal tear    Allergy    Anxiety    Atrial fibrillation ([facility]   a. new onset 07/2016; b. CHADS2VASc --> 4 (HTN, female, age, CHF); c. eliquis   CHF (congestive heart failure) (HCC)    Chronic anticoagulation    Apixaban   Chronic lower back pain    Colon polyp    Complication of anesthesia    Emergence delirium; hypoxia   COPD (chronic obstructive pulmonary disease) ([facility]   Cystic breast    Depression    Endometriosis    Fibrocystic breast disease    GERD (gastroesophageal reflux disease)    History of stress test    a. 07/2016 MV: EF 55-65%. Small, mild defect  in mid anteroseptal, apical septal, and apical locations. No ischemia-->low risk.   Hyperlipidemia    Hypertension    Insomnia    Lipoma    Malignant neoplasm of upper-outer quadrant of right female breast (HCC)    Stage 1A invasive mammary carcinoma (cT1b or T1c N0); ER (-), PR (+),  HER2/neu (+)   Migraine    Mild Mitral regurgitation    a. 07/2016 Echo: EF 55-65%, no rwma, mild MR. Mildly dil LA. Nl RV fx.    Nephrolithiasis    OSA (obstructive sleep apnea)    non-compliant with nocturnal PAP therapy   Osteoarthritis    left knee   Osteopenia    Peptic ulcer disease    a. H. pylori   Pneumonia    RLS (restless legs syndrome)    Tendonitis    Thyroid disease    Vitamin D deficiency     SURGICAL  HISTORY: Past Surgical History:  Procedure Laterality Date   ABDOMINAL HYSTERECTOMY     ovaries left   ACHILLES TENDON SURGERY     2012,01/2017   BACK SURGERY     fusion lumbar   BREAST BIOPSY Right 01/15/2021   Affirm biopsy-"X" clip-INVASIVE MAMMARY CARCINOMA   BREAST BIOPSY Right 01/15/2021   u/s bx-"venus" clip INVASIVE MAMMARY CARCINOMA   BREAST BIOPSY     BREAST LUMPECTOMY,RADIO FREQ LOCALIZER,AXILLARY SENTINEL LYMPH NODE BIOPSY Right 02/17/2021   Procedure: BREAST LUMPECTOMY,RADIO FREQ LOCALIZER,AXILLARY SENTINEL LYMPH NODE BIOPSY;  Surgeon: Ronny Bacon, MD;  Location: [facility];  Service: General;  Laterality: Right;   CARDIOVERSION N/A 09/07/2016   Procedure: CARDIOVERSION;  Surgeon: Minna Merritts, MD;  Location: [facility];  Service: Cardiovascular;  Laterality: N/A;   CARDIOVERSION N/A 10/19/2016   Procedure: CARDIOVERSION;  Surgeon: Minna Merritts, MD;  Location: [facility];  Service: Cardiovascular;  Laterality: N/A;   CARDIOVERSION N/A 12/14/2016   Procedure: CARDIOVERSION;  Surgeon: Minna Merritts, MD;  Location: [facility];  Service: Cardiovascular;  Laterality: N/A;   COLONOSCOPY     COLONOSCOPY WITH PROPOFOL N/A 05/13/2020   Procedure: COLONOSCOPY WITH PROPOFOL;  Surgeon: Toledo, Benay Pike, MD;  Location: [facility];  Service: Gastroenterology;  Laterality: N/A;  ELIQUIS   CYSTOSCOPY WITH STENT PLACEMENT Bilateral 07/12/2021   Procedure: CYSTOSCOPY WITH STENT PLACEMENT;  Surgeon: Billey Co, MD;  Location: [facility];  Service: Urology;  Laterality: Bilateral;   CYSTOSCOPY/URETEROSCOPY/HOLMIUM LASER/STENT PLACEMENT Bilateral 07/30/2021   Procedure: CYSTOSCOPY/URETEROSCOPY/HOLMIUM LASER/BILATERAL STENT REMOVAL;  Surgeon: Billey Co, MD;  Location: [facility];  Service: Urology;  Laterality: Bilateral;   ESOPHAGOGASTRODUODENOSCOPY     ESOPHAGOGASTRODUODENOSCOPY (EGD) WITH PROPOFOL N/A 05/13/2020   Procedure: ESOPHAGOGASTRODUODENOSCOPY (EGD) WITH PROPOFOL;   Surgeon: Toledo, Benay Pike, MD;  Location: [facility];  Service: Gastroenterology;  Laterality: N/A;   FOOT SURGERY Left    tendon   JOINT REPLACEMENT Right    hip   PORTA CATH INSERTION N/A 03/23/2021   Procedure: PORTA CATH INSERTION;  Surgeon: Katha Cabal, MD;  Location: [facility];  Service: Cardiovascular;  Laterality: N/A;   PORTACATH PLACEMENT N/A 03/23/2021   Procedure: ATTEMPTED INSERTION PORT-A-CATH;  Surgeon: Ronny Bacon, MD;  Location: [facility];  Service: General;  Laterality: N/A;   THYROIDECTOMY     thyroid lobectomy secondary to a benign nodule   TOTAL HIP ARTHROPLASTY Right 08/21/2018   Procedure: TOTAL HIP ARTHROPLASTY ANTERIOR APPROACH-RIGHT CELL SAVER REQUESTED;  Surgeon: [name], MD;  Location: [facility];  Service: Orthopedics;  Laterality: Right;    SOCIAL HISTORY: Social History   Socioeconomic History   Marital status: Married    Spouse name: jeffrey   Number of children: 1   Years of education: Not on file   Highest education level: High school graduate  Occupational History    Comment: disabled  Tobacco Use  Smoking status: Former    Packs/day: 0.25    Years: 10.00    Total pack years: 2.50    Types: Cigarettes    Quit date: 2005    Years since quitting: 18.7   Smokeless tobacco: Never   Tobacco comments:    smoked for 10 years, 1 pack every 2-3 days  Vaping Use   Vaping Use: Never used  Substance and Sexual Activity   Alcohol use: Not Currently    Alcohol/week: 1.0 standard drink of alcohol    Types: 1 Glasses of wine per week    Comment: rarely   Drug use: No   Sexual activity: Not Currently  Other Topics Concern   Not on file  Social History Narrative   Moved to [place] from [place], originally from [place].    1 cup of caffeine daily   Right handed   Lives with husband, step-daughter and child       Social Determinants of Health   Financial Resource Strain: Low Risk  (12/09/2021)    Overall Financial Resource Strain (CARDIA)    Difficulty of Paying Living Expenses: Not very hard  Food Insecurity: No Food Insecurity (12/09/2021)   Hunger Vital Sign    Worried About Running Out of Food in the Last Year: Never true    Ran Out of Food in the Last Year: Never true  Transportation Needs: No Transportation Needs (12/09/2021)   PRAPARE - Hydrologist (Medical): No    Lack of Transportation (Non-Medical): No  Physical Activity: Insufficiently Active (12/09/2021)   Exercise Vital Sign    Days of Exercise per Week: 7 days    Minutes of Exercise per Session: 20 min  Stress: Stress Concern Present (12/09/2021)   [place]    Feeling of Stress : Very much  Social Connections: Moderately Isolated (12/09/2021)   Social Connection and Isolation Panel [NHANES]    Frequency of Communication with Friends and Family: Never    Frequency of Social Gatherings with Friends and Family: Once a week    Attends Religious Services: More than 4 times per year    Active Member of [employer] or Organizations: No    Attends Archivist Meetings: Never    Marital Status: Married  Human resources officer Violence: Not At Risk (12/09/2021)   Humiliation, Afraid, Rape, and Kick questionnaire    Fear of Current or Ex-Partner: No    Emotionally Abused: No    Physically Abused: No    Sexually Abused: No    FAMILY HISTORY: Family History  Problem Relation Age of Onset   Cancer Mother        leukemia   Cancer Father 39       colon   Heart disease Father    Heart attack Father 68       died from MI   Hyperlipidemia Father    Hypertension Father    Cancer Sister 51       ovarian   Depression Sister    Cancer Brother        CLL   Breast cancer Paternal Aunt        40's   Migraines Neg Hx    Thyroid cancer Neg Hx     ALLERGIES:  is allergic to prednisone, amiodarone, hydrocodone-acetaminophen, oxycodone,  tapentadol, tramadol, adhesive [tape], and latex.  MEDICATIONS:  Current Outpatient Medications  Medication Sig Dispense Refill   acetaminophen (TYLENOL)  500 MG tablet Take 500-1,000 mg by mouth every 6 (six) hours as needed for mild pain or moderate pain.     ALPRAZolam (XANAX) 0.25 MG tablet TAKE 1 TABLET BY MOUTH 2 TIMES DAILY AS NEEDED FOR ANXIETY. 60 tablet 2   apixaban (ELIQUIS) 5 MG TABS tablet TAKE 1 TABLET BY MOUTH TWICE A DAY 180 tablet 1   azelastine (ASTELIN) 0.1 % nasal spray Place 1-2 sprays into both nostrils daily as needed for rhinitis.     bisoprolol (ZEBETA) 5 MG tablet Take 5 mg by mouth in the morning and at bedtime.     busPIRone (BUSPAR) 5 MG tablet Take 1 tablet (5 mg total) by mouth 3 (three) times daily as needed. 60 tablet 1   Cholecalciferol (VITAMIN D3) 5000 units CAPS Take 5,000 Units by mouth daily.     clotrimazole (LOTRIMIN) 1 % cream Apply 1 application topically 2 (two) times daily. 30 g 0   diphenhydrAMINE (BENADRYL) 25 mg capsule Take 1 capsule (25 mg total) by mouth every 8 (eight) hours as needed for itching or allergies. 30 capsule 0   esomeprazole (NEXIUM) 20 MG capsule Take 20 mg by mouth daily as needed (GERD symptoms).     FLUoxetine (PROZAC) 20 MG capsule Take 1 capsule (20 mg total) by mouth every morning. 90 capsule 3   furosemide (LASIX) 20 MG tablet Take 1 tablet (20 mg total) by mouth 2 (two) times daily. 180 tablet 0   gabapentin (NEURONTIN) 100 MG capsule TAKE 1 CAPSULE (100 MG TOTAL) BY MOUTH THREE TIMES DAILY. 270 capsule 0   hydrocortisone 2.5 % ointment Apply topically 2 (two) times daily. prn 60 g 2   KLOR-CON M20 20 MEQ tablet TAKE 1 TABLET BY MOUTH EVERY DAY 90 tablet 1   Liraglutide -Weight Management (SAXENDA) 18 MG/3ML SOPN Inject 0.6 mg into the skin daily. 3 mL 2   loperamide (IMODIUM) 2 MG capsule Take 1 capsule (2 mg total) by mouth See admin instructions. Initial: 4 mg, followed by 2 mg after each loose stool; maximum: 16 mg/day  60 capsule 1   meclizine (ANTIVERT) 12.5 MG tablet Take 1 tablet (12.5 mg total) by mouth 3 (three) times daily as needed for dizziness. 30 tablet 0   Multiple Vitamin (MULTIVITAMIN) tablet Take 1 tablet by mouth daily.     nystatin (MYCOSTATIN/NYSTOP) powder Apply 1 application. topically 3 (three) times daily. 60 g 11   nystatin cream (MYCOSTATIN) Apply 1 application. topically 2 (two) times daily. Prn abdomen 60 g 5   ondansetron (ZOFRAN) 8 MG tablet Take 1 tablet (8 mg total) by mouth every 8 (eight) hours as needed for nausea or vomiting. 60 tablet 0   promethazine (PHENERGAN) 25 MG tablet Take 1 tablet (25 mg total) by mouth every 6 (six) hours as needed for nausea or vomiting. 60 tablet 0   ranolazine (RANEXA) 500 MG 12 hr tablet TAKE 1 TABLET BY MOUTH TWICE A DAY 180 tablet 1   rizatriptan (MAXALT) 10 MG tablet TAKE 1 TABLET (10 MG TOTAL) BY MOUTH AS NEEDED FOR MIGRAINE. MAY REPEAT IN 2 HOURS IF NEEDED 10 tablet 0   rOPINIRole (REQUIP) 2 MG tablet TAKE 1 TABLET BY MOUTH AT BEDTIME. 90 tablet 1   rosuvastatin (CRESTOR) 20 MG tablet TAKE 1 TABLET BY MOUTH EVERY DAY 90 tablet 0   rosuvastatin (CRESTOR) 40 MG tablet Take 1 tablet (40 mg total) by mouth daily. 90 tablet 3   silver sulfADIAZINE (SILVADENE) 1 % cream  Apply 1 application. topically 2 (two) times daily. 50 g 3   No current facility-administered medications for this visit.     PHYSICAL EXAMINATION: ECOG PERFORMANCE STATUS: 1 - Symptomatic but completely ambulatory Vitals:   04/13/22 1034 04/13/22 1102  BP: (!) 175/93 (!) 157/87  Pulse: (!) 51   Resp: 18   Temp: (!) 97.2 F (36.2 C)   SpO2: 96%     Filed Weights   04/13/22 1030  Weight: 261 lb (118.4 kg)     Physical Exam Constitutional:      General: She is not in acute distress.    Appearance: She is obese.  HENT:     Head: Normocephalic and atraumatic.  Eyes:     General: No scleral icterus. Cardiovascular:     Rate and Rhythm: Normal rate and regular  rhythm.     Heart sounds: Normal heart sounds.  Pulmonary:     Effort: Pulmonary effort is normal. No respiratory distress.     Breath sounds: No wheezing.  Abdominal:     General: Bowel sounds are normal. There is no distension.     Palpations: Abdomen is soft.  Musculoskeletal:        General: No deformity. Normal range of motion.     Cervical back: Normal range of motion and neck supple.     Comments: 1+ edema bilaterally in lower extremity Small left scapular soft tissue mass,   Skin:    General: Skin is warm and dry.     Findings: No erythema or rash.  Neurological:     Mental Status: She is alert and oriented to person, place, and time. Mental status is at baseline.     Cranial Nerves: No cranial nerve deficit.     Coordination: Coordination normal.  Psychiatric:        Mood and Affect: Mood normal.     . LABORATORY DATA:  I have reviewed the data as listed    Latest Ref Rng & Units 04/13/2022   10:13 AM 03/23/2022    8:33 AM 03/02/2022    8:55 AM  CBC  WBC 4.0 - 10.5 K/uL 4.7  6.0  4.9   Hemoglobin 12.0 - 15.0 g/dL 12.7  12.7  12.6   Hematocrit 36.0 - 46.0 % 37.7  38.3  37.9   Platelets 150 - 400 K/uL 152  183  165       Latest Ref Rng & Units 04/13/2022   10:13 AM 03/23/2022    8:33 AM 03/02/2022    8:55 AM  CMP  Glucose 70 - 99 mg/dL 88  95  92   BUN 8 - 23 mg/dL _0 Creatinine 0.44 - 1.00 mg/dL 0.83  0.94  0.98   Sodium 135 - 145 mmol/L 138  140  137   Potassium 3.5 - 5.1 mmol/L 3.9  3.6  3.9   Chloride 98 - 111 mmol/L 105  106  101   CO2 22 - 32 mmol/L _1 Calcium 8.9 - 10.3 mg/dL 8.6  8.3  8.9   Total Protein 6.5 - 8.1 g/dL 7.1  6.9  7.2   Total Bilirubin 0.3 - 1.2 mg/dL 0.8  0.6  0.8   Alkaline Phos 38 - 126 U/L 76  69  77   AST 15 - 41 U/L _2 ALT 0 - 44 U/L 15  14  13  RADIOGRAPHIC STUDIES: I have personally reviewed the radiological images as listed and agreed with the findings in the report. No results  found.

## 2022-04-13 NOTE — Assessment & Plan Note (Signed)
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  She may increase dose of 200 mg at bedtime.  

## 2022-04-13 NOTE — Progress Notes (Signed)
Occasional nausea over the last 3-4 weeks.. - needs refill on medications.  Chronic cough that she has had for a while.

## 2022-04-13 NOTE — Assessment & Plan Note (Signed)
Treatment plan as needed.  

## 2022-04-25 ENCOUNTER — Other Ambulatory Visit: Payer: Self-pay | Admitting: Oncology

## 2022-04-25 ENCOUNTER — Other Ambulatory Visit: Payer: Self-pay | Admitting: Cardiovascular Disease

## 2022-04-25 ENCOUNTER — Other Ambulatory Visit: Payer: Self-pay | Admitting: Family

## 2022-04-25 DIAGNOSIS — R112 Nausea with vomiting, unspecified: Secondary | ICD-10-CM

## 2022-04-25 DIAGNOSIS — G2581 Restless legs syndrome: Secondary | ICD-10-CM

## 2022-04-25 DIAGNOSIS — F419 Anxiety disorder, unspecified: Secondary | ICD-10-CM

## 2022-05-04 ENCOUNTER — Encounter: Payer: Self-pay | Admitting: Oncology

## 2022-05-04 ENCOUNTER — Inpatient Hospital Stay: Payer: Medicare Other

## 2022-05-04 ENCOUNTER — Inpatient Hospital Stay (HOSPITAL_BASED_OUTPATIENT_CLINIC_OR_DEPARTMENT_OTHER): Payer: Medicare Other | Admitting: Oncology

## 2022-05-04 VITALS — BP 125/91 | HR 50 | Temp 96.0°F | Wt 259.6 lb

## 2022-05-04 DIAGNOSIS — Z5112 Encounter for antineoplastic immunotherapy: Secondary | ICD-10-CM | POA: Diagnosis not present

## 2022-05-04 DIAGNOSIS — F17210 Nicotine dependence, cigarettes, uncomplicated: Secondary | ICD-10-CM | POA: Diagnosis not present

## 2022-05-04 DIAGNOSIS — G4700 Insomnia, unspecified: Secondary | ICD-10-CM | POA: Diagnosis not present

## 2022-05-04 DIAGNOSIS — C50919 Malignant neoplasm of unspecified site of unspecified female breast: Secondary | ICD-10-CM | POA: Diagnosis not present

## 2022-05-04 DIAGNOSIS — I340 Nonrheumatic mitral (valve) insufficiency: Secondary | ICD-10-CM | POA: Diagnosis not present

## 2022-05-04 DIAGNOSIS — I509 Heart failure, unspecified: Secondary | ICD-10-CM | POA: Diagnosis not present

## 2022-05-04 DIAGNOSIS — E559 Vitamin D deficiency, unspecified: Secondary | ICD-10-CM | POA: Diagnosis not present

## 2022-05-04 DIAGNOSIS — Z5181 Encounter for therapeutic drug level monitoring: Secondary | ICD-10-CM | POA: Diagnosis not present

## 2022-05-04 DIAGNOSIS — Z171 Estrogen receptor negative status [ER-]: Secondary | ICD-10-CM

## 2022-05-04 DIAGNOSIS — K219 Gastro-esophageal reflux disease without esophagitis: Secondary | ICD-10-CM | POA: Diagnosis not present

## 2022-05-04 DIAGNOSIS — I480 Paroxysmal atrial fibrillation: Secondary | ICD-10-CM | POA: Diagnosis not present

## 2022-05-04 DIAGNOSIS — C50411 Malignant neoplasm of upper-outer quadrant of right female breast: Secondary | ICD-10-CM | POA: Diagnosis not present

## 2022-05-04 DIAGNOSIS — E785 Hyperlipidemia, unspecified: Secondary | ICD-10-CM | POA: Diagnosis not present

## 2022-05-04 DIAGNOSIS — J449 Chronic obstructive pulmonary disease, unspecified: Secondary | ICD-10-CM | POA: Diagnosis not present

## 2022-05-04 DIAGNOSIS — I4891 Unspecified atrial fibrillation: Secondary | ICD-10-CM | POA: Diagnosis not present

## 2022-05-04 DIAGNOSIS — Z806 Family history of leukemia: Secondary | ICD-10-CM | POA: Diagnosis not present

## 2022-05-04 DIAGNOSIS — G629 Polyneuropathy, unspecified: Secondary | ICD-10-CM

## 2022-05-04 DIAGNOSIS — Z923 Personal history of irradiation: Secondary | ICD-10-CM | POA: Diagnosis not present

## 2022-05-04 DIAGNOSIS — Z79899 Other long term (current) drug therapy: Secondary | ICD-10-CM

## 2022-05-04 DIAGNOSIS — I10 Essential (primary) hypertension: Secondary | ICD-10-CM | POA: Diagnosis not present

## 2022-05-04 DIAGNOSIS — M5412 Radiculopathy, cervical region: Secondary | ICD-10-CM | POA: Diagnosis not present

## 2022-05-04 DIAGNOSIS — Z86010 Personal history of colonic polyps: Secondary | ICD-10-CM | POA: Diagnosis not present

## 2022-05-04 DIAGNOSIS — Z8616 Personal history of COVID-19: Secondary | ICD-10-CM | POA: Diagnosis not present

## 2022-05-04 DIAGNOSIS — Z7901 Long term (current) use of anticoagulants: Secondary | ICD-10-CM | POA: Diagnosis not present

## 2022-05-04 DIAGNOSIS — Z9221 Personal history of antineoplastic chemotherapy: Secondary | ICD-10-CM | POA: Diagnosis not present

## 2022-05-04 DIAGNOSIS — M8580 Other specified disorders of bone density and structure, unspecified site: Secondary | ICD-10-CM | POA: Diagnosis not present

## 2022-05-04 LAB — CBC WITH DIFFERENTIAL/PLATELET
Abs Immature Granulocytes: 0.01 10*3/uL (ref 0.00–0.07)
Basophils Absolute: 0 10*3/uL (ref 0.0–0.1)
Basophils Relative: 1 %
Eosinophils Absolute: 0.1 10*3/uL (ref 0.0–0.5)
Eosinophils Relative: 1 %
HCT: 41.9 % (ref 36.0–46.0)
Hemoglobin: 14 g/dL (ref 12.0–15.0)
Immature Granulocytes: 0 %
Lymphocytes Relative: 35 %
Lymphs Abs: 2 10*3/uL (ref 0.7–4.0)
MCH: 30.9 pg (ref 26.0–34.0)
MCHC: 33.4 g/dL (ref 30.0–36.0)
MCV: 92.5 fL (ref 80.0–100.0)
Monocytes Absolute: 0.4 10*3/uL (ref 0.1–1.0)
Monocytes Relative: 7 %
Neutro Abs: 3.1 10*3/uL (ref 1.7–7.7)
Neutrophils Relative %: 56 %
Platelets: 177 10*3/uL (ref 150–400)
RBC: 4.53 MIL/uL (ref 3.87–5.11)
RDW: 13 % (ref 11.5–15.5)
WBC: 5.6 10*3/uL (ref 4.0–10.5)
nRBC: 0 % (ref 0.0–0.2)

## 2022-05-04 LAB — COMPREHENSIVE METABOLIC PANEL
ALT: 17 U/L (ref 0–44)
AST: 22 U/L (ref 15–41)
Albumin: 3.8 g/dL (ref 3.5–5.0)
Alkaline Phosphatase: 74 U/L (ref 38–126)
Anion gap: 8 (ref 5–15)
BUN: 20 mg/dL (ref 8–23)
CO2: 27 mmol/L (ref 22–32)
Calcium: 8.6 mg/dL — ABNORMAL LOW (ref 8.9–10.3)
Chloride: 105 mmol/L (ref 98–111)
Creatinine, Ser: 0.91 mg/dL (ref 0.44–1.00)
GFR, Estimated: >60 mL/min (ref >60)
Glucose, Bld: 105 mg/dL — ABNORMAL HIGH (ref 70–99)
Potassium: 3.9 mmol/L (ref 3.5–5.1)
Sodium: 140 mmol/L (ref 135–145)
Total Bilirubin: 0.5 mg/dL (ref 0.3–1.2)
Total Protein: 7.1 g/dL (ref 6.5–8.1)

## 2022-05-04 MED ORDER — SODIUM CHLORIDE 0.9 % IV SOLN
Freq: Once | INTRAVENOUS | Status: AC
  Filled 2022-05-04: qty 250

## 2022-05-04 MED ORDER — TRASTUZUMAB-ANNS CHEMO 150 MG IV SOLR
6.0000 mg/kg | Freq: Once | INTRAVENOUS | Status: AC
  Administered 2022-05-04: 693 mg via INTRAVENOUS
  Filled 2022-05-04: qty 33

## 2022-05-04 MED ORDER — DIPHENHYDRAMINE HCL 25 MG PO CAPS
50.0000 mg | ORAL_CAPSULE | Freq: Once | ORAL | Status: AC
  Administered 2022-05-04: 50 mg via ORAL
  Filled 2022-05-04: qty 2

## 2022-05-04 MED ORDER — HEPARIN SOD (PORK) LOCK FLUSH 100 UNIT/ML IV SOLN
500.0000 [IU] | Freq: Once | INTRAVENOUS | Status: AC | PRN
  Administered 2022-05-04: 500 [IU]
  Filled 2022-05-04: qty 5

## 2022-05-04 MED ORDER — ACETAMINOPHEN 325 MG PO TABS
650.0000 mg | ORAL_TABLET | Freq: Once | ORAL | Status: AC
  Administered 2022-05-04: 650 mg via ORAL
  Filled 2022-05-04: qty 2

## 2022-05-04 NOTE — Assessment & Plan Note (Signed)
Follow-up with cardiology.  Patient is on Eliquis 5 mg twice daily.

## 2022-05-04 NOTE — Assessment & Plan Note (Signed)
Treatment plan as needed.  

## 2022-05-04 NOTE — Progress Notes (Signed)
Hematology/Oncology Progress note [phone] [phone]) [phone]      ASSESSMENT & PLAN:   Cancer Staging  Malignant neoplasm of upper-outer quadrant of right female breast [facility]) Staging form: Breast, AJCC 8th Edition - Pathologic stage from 03/03/2021: Stage IA (pT1b, pN0, cM0, G3, ER-, PR+, HER2+) - Signed by [name], MD on 03/03/2021   Malignant neoplasm of upper-outer quadrant of right female breast [facility]) #Right breast invasive carcinoma pT1b pN0 ER negative, PR weakly positive HER 2 positive.  Patient does not tolerate adjuvant chemotherapy with Taxol and trastuzumab despite Taxol dose reduction.-->Status post adjuvant radiation--> maintenance Trastuzumab Labs are reviewed and discussed with patient. Proceed with trastuzumab maintenance. August 2023 Echo showed stable LVEF Obtain right diagnostic mammogram in December 2023 Repeat echocardiogram in November 2023.  Encounter for monoclonal antibody treatment for malignancy Treatment plan as needed.   Atrial fibrillation [facility]) Follow-up with cardiology.  Patient is on Eliquis 5 mg twice daily.  Neuropathy Pre-existing neuropathy in her fingertips bilaterally- secondary to cervical spine radiculopathy continue 100 mg 3 times daily.  She may increase dose of 200 mg at bedtime.   Orders Placed This Encounter  Procedures   Comprehensive metabolic panel    Standing Status:   Future    Standing Expiration Date:   07/07/2023   CBC with Differential    Standing Status:   Future    Standing Expiration Date:   07/06/2023   Comprehensive metabolic panel    Standing Status:   Future    Standing Expiration Date:   07/28/2023   CBC with Differential    Standing Status:   Future    Standing Expiration Date:   07/27/2023   ECHOCARDIOGRAM COMPLETE    Standing Status:   Future    Standing Expiration Date:   05/05/2023    Order Specific Question:   Where should this test be performed    Answer:   [facility]     Order Specific Question:   Perflutren DEFINITY (image enhancing agent) should be administered unless hypersensitivity or allergy exist    Answer:   Administer Perflutren    Order Specific Question:   Reason for exam-Echo    Answer:   Chemo  Z09     Follow up  3 weeks lab MD trastuzumab.   All questions were answered. The patient knows to call the clinic with any problems, questions or concerns.  [name], MD, PhD [facility] Hematology Oncology 05/04/2022   CHIEF COMPLAINTS/REASON FOR VISIT:  Follow-up for stage I HER2 positive breast cancer  HISTORY OF PRESENTING ILLNESS:   Amber Barnes is a  71 y.o.  female presents for follow up of right breast cancer.  Oncology History  Malignant neoplasm of upper-outer quadrant of right female breast ([place])  01/19/2021 Initial Diagnosis   Breast cancer in female  -12/30/2020, patient had a screening mammogram done which showed possible mass and asymmetry in the right breast. 01/08/2021, diagnostic mammogram of the right breast showed 2 adjacent irregular spiculated masses.  Larger 1 was 10 mm and a smaller mass measured 5 mm. 01/15/2021 stereotactic biopsy of the right breast smaller mass showed invasive carcinoma no special type, grade 2, DCIS present, LVI negative.Ultrasound guided biopsy of the larger mass showed invasive mammary carcinoma  02/17/2021, patient is status post lumpectomy and sentinel lymph node biopsy Pathology showed invasive mammary carcinoma, 2 separate foci, DCIS, negative sentinel lymph node biopsy 0/1, grade 3, or margins negative for invasive carcinoma.  ER -, PR + [1-10%],  HER2 +  pT1b(m) pN0   Strong family history of cancer, Discussed again about genetic testing. patient adamantly declined.   03/03/2021 Cancer Staging   Staging form: Breast, AJCC 8th Edition - Pathologic stage from 03/03/2021: Stage IA (pT1b, pN0, cM0, G3, ER-, PR+, HER2+) - Signed by [name], MD on 03/03/2021 Stage prefix: Initial diagnosis Multigene  prognostic tests performed: None Histologic grading system: 3 grade system   03/08/2021 Echocardiogram   echocardiogram showed LVEF 60-65%. Per patient, she has been seen by Dr. [name] team and was cleared for proceeding with chemotherapy.    02/2021 Genetic Testing   Strong family history of cancer, Discussed again about genetic testing. patient adamantly declined.   03/23/2021 Procedure   Mediport by vascular surgeon   03/24/2021 -  Chemotherapy   03/24/2021 - 03/31/2021 Taxol 80 mg/m2 [name] 04/05/2021 - 04/09/2021 patient was admitted due to neutropenic fever with associated SIRS.  Patient was treated with empiric cefepime and vancomycin until date of discharge when [place] recovered to 1.4. Post hospitalization, patient also had COVID-19 infection so her follow-up appointment was further postponed for another 2 weeks.  05/10/2021 resumed dose reduce Taxol 69m/m2 /[name]11/15/2022-06/30/2021, cycle 2 dose reduced Taxol 658mm2 /trastuzumab   admitted from 07/12/2021 - 07/17/2021 due to acute pyelonephritis UVJ obstruction and a right hydro ureteralnephrosis.  Patient was treated with IV antibiotics status post emergent bilateral ureteral stent placement.  Discharged home with a course of oral antibiotics.  Shared decision made to hold chemotherapy and proceed with radiation.    08/18/2021 - 09/08/2021 Radiation Therapy    adjuvant breast radiation   10/06/2021 -  Chemotherapy   10/06/2021, shared decision was made to proceed with maintenance trastuzumab.  Her cardiologist Dr. GoRockey Situave clearance to proceed. Plan Transtuzumab for up to a year if tolerates.    12/14/2021 Mammogram   Bilateral diagnostic mammogram 1. Probably benign right breast calcifications along the posterior lumpectomy bed felt to represent early changes of fat necrosis.Recommend short-term imaging follow-up. 2. No mammographic evidence of malignancy on the left.   03/02/2022 Echocardiogram   LVEF 60-65%, grade I  diastolic dysfuction   HEYQM2-NOIBBCWUarcinoma of breast (HCC)    Left butt/thigh tenderness for a few weeks, ultrasound soft tissue left lower extremity negative.   INTERVAL HISTORY Amber Merrittss a [age].o. female who has above history reviewed by me today presents for follow up visit for management of right side HER 2 positive breast cancer Patient has intermittent episodes of atrial fibrillation.  She attributes to too much activity during the past week.  Slight chest heaviness, denies shortness of breath, chest pain, leg swelling, nausea vomiting, abdominal pain.  She has appointment with Dr. GoRockey Situext Monday.  She knows to call cardiologist office if symptoms gets worse.   Review of Systems  Constitutional:  Positive for fatigue. Negative for appetite change, chills, fever and unexpected weight change.       Patient sits in the wheelchair  HENT:   Negative for hearing loss and voice change.   Respiratory:  Negative for chest tightness and cough.   Cardiovascular:  Negative for chest pain and leg swelling.  Gastrointestinal:  Negative for abdominal distention, abdominal pain, blood in stool, nausea and vomiting.  Endocrine: Negative for hot flashes.  Genitourinary:  Negative for difficulty urinating, frequency and hematuria.   Musculoskeletal:  Positive for arthralgias.  Skin:  Negative for itching and rash.  Neurological:  Positive for numbness. Negative for dizziness and extremity weakness.  Hematological:  Negative for adenopathy.  Psychiatric/Behavioral:  Negative for confusion.     MEDICAL HISTORY:  Past Medical History:  Diagnosis Date   Achilles tendinitis    Acute medial meniscal tear    Allergy    Anxiety    Atrial fibrillation ([place])    a. new onset 07/2016; b. CHADS2VASc --> 4 (HTN, female, age, CHF); c. eliquis   CHF (congestive heart failure) (HCC)    Chronic anticoagulation    Apixaban   Chronic lower back pain    Colon polyp    Complication of  anesthesia    Emergence delirium; hypoxia   COPD (chronic obstructive pulmonary disease) ([place])    Cystic breast    Depression    Endometriosis    Fibrocystic breast disease    GERD (gastroesophageal reflux disease)    History of stress test    a. 07/2016 MV: EF 55-65%. Small, mild defect  in mid anteroseptal, apical septal, and apical locations. No ischemia-->low risk.   Hyperlipidemia    Hypertension    Insomnia    Lipoma    Malignant neoplasm of upper-outer quadrant of right female breast (HCC)    Stage 1A invasive mammary carcinoma (cT1b or T1c N0); ER (-), PR (+), HER2/neu (+)   Migraine    Mild Mitral regurgitation    a. 07/2016 Echo: EF 55-65%, no rwma, mild MR. Mildly dil LA. Nl RV fx.    Nephrolithiasis    OSA (obstructive sleep apnea)    non-compliant with nocturnal PAP therapy   Osteoarthritis    left knee   Osteopenia    Peptic ulcer disease    a. H. pylori   Pneumonia    RLS (restless legs syndrome)    Tendonitis    Thyroid disease    Vitamin D deficiency     SURGICAL HISTORY: Past Surgical History:  Procedure Laterality Date   ABDOMINAL HYSTERECTOMY     ovaries left   ACHILLES TENDON SURGERY     2012,01/2017   BACK SURGERY     fusion lumbar   BREAST BIOPSY Right 01/15/2021   Affirm biopsy-"X" clip-INVASIVE MAMMARY CARCINOMA   BREAST BIOPSY Right 01/15/2021   u/s bx-"venus" clip INVASIVE MAMMARY CARCINOMA   BREAST BIOPSY     BREAST LUMPECTOMY,RADIO FREQ LOCALIZER,AXILLARY SENTINEL LYMPH NODE BIOPSY Right 02/17/2021   Procedure: BREAST LUMPECTOMY,RADIO FREQ LOCALIZER,AXILLARY SENTINEL LYMPH NODE BIOPSY;  Surgeon: Ronny Bacon, MD;  Location: [facility];  Service: General;  Laterality: Right;   CARDIOVERSION N/A 09/07/2016   Procedure: CARDIOVERSION;  Surgeon: Minna Merritts, MD;  Location: [facility];  Service: Cardiovascular;  Laterality: N/A;   CARDIOVERSION N/A 10/19/2016   Procedure: CARDIOVERSION;  Surgeon: Minna Merritts, MD;  Location: [facility];   Service: Cardiovascular;  Laterality: N/A;   CARDIOVERSION N/A 12/14/2016   Procedure: CARDIOVERSION;  Surgeon: Minna Merritts, MD;  Location: [facility];  Service: Cardiovascular;  Laterality: N/A;   COLONOSCOPY     COLONOSCOPY WITH PROPOFOL N/A 05/13/2020   Procedure: COLONOSCOPY WITH PROPOFOL;  Surgeon: Toledo, Benay Pike, MD;  Location: [facility];  Service: Gastroenterology;  Laterality: N/A;  ELIQUIS   CYSTOSCOPY WITH STENT PLACEMENT Bilateral 07/12/2021   Procedure: CYSTOSCOPY WITH STENT PLACEMENT;  Surgeon: Billey Co, MD;  Location: [facility];  Service: Urology;  Laterality: Bilateral;   CYSTOSCOPY/URETEROSCOPY/HOLMIUM LASER/STENT PLACEMENT Bilateral 07/30/2021   Procedure: CYSTOSCOPY/URETEROSCOPY/HOLMIUM LASER/BILATERAL STENT REMOVAL;  Surgeon: Billey Co, MD;  Location: [facility];  Service: Urology;  Laterality: Bilateral;   ESOPHAGOGASTRODUODENOSCOPY  ESOPHAGOGASTRODUODENOSCOPY (EGD) WITH PROPOFOL N/A 05/13/2020   Procedure: ESOPHAGOGASTRODUODENOSCOPY (EGD) WITH PROPOFOL;  Surgeon: Toledo, Benay Pike, MD;  Location: [facility];  Service: Gastroenterology;  Laterality: N/A;   FOOT SURGERY Left    tendon   JOINT REPLACEMENT Right    hip   PORTA CATH INSERTION N/A 03/23/2021   Procedure: PORTA CATH INSERTION;  Surgeon: Katha Cabal, MD;  Location: [facility];  Service: Cardiovascular;  Laterality: N/A;   PORTACATH PLACEMENT N/A 03/23/2021   Procedure: ATTEMPTED INSERTION PORT-A-CATH;  Surgeon: Ronny Bacon, MD;  Location: [facility];  Service: General;  Laterality: N/A;   THYROIDECTOMY     thyroid lobectomy secondary to a benign nodule   TOTAL HIP ARTHROPLASTY Right 08/21/2018   Procedure: TOTAL HIP ARTHROPLASTY ANTERIOR APPROACH-RIGHT CELL SAVER REQUESTED;  Surgeon: [name], MD;  Location: [facility];  Service: Orthopedics;  Laterality: Right;    SOCIAL HISTORY: Social History   Socioeconomic History   Marital status: Married    Spouse  name: jeffrey   Number of children: 1   Years of education: Not on file   Highest education level: High school graduate  Occupational History    Comment: disabled  Tobacco Use   Smoking status: Former    Packs/day: 0.25    Years: 10.00    Total pack years: 2.50    Types: Cigarettes    Quit date: 2005    Years since quitting: 18.8   Smokeless tobacco: Never   Tobacco comments:    smoked for 10 years, 1 pack every 2-3 days  Vaping Use   Vaping Use: Never used  Substance and Sexual Activity   Alcohol use: Not Currently    Alcohol/week: 1.0 standard drink of alcohol    Types: 1 Glasses of wine per week    Comment: rarely   Drug use: No   Sexual activity: Not Currently  Other Topics Concern   Not on file  Social History Narrative   Moved to [place] from [place], originally from [place].    1 cup of caffeine daily   Right handed   Lives with husband, step-daughter and child       Social Determinants of Health   Financial Resource Strain: Low Risk  (12/09/2021)   Overall Financial Resource Strain (CARDIA)    Difficulty of Paying Living Expenses: Not very hard  Food Insecurity: No Food Insecurity (12/09/2021)   Hunger Vital Sign    Worried About Running Out of Food in the Last Year: Never true    Ran Out of Food in the Last Year: Never true  Transportation Needs: No Transportation Needs (12/09/2021)   PRAPARE - Hydrologist (Medical): No    Lack of Transportation (Non-Medical): No  Physical Activity: Insufficiently Active (12/09/2021)   Exercise Vital Sign    Days of Exercise per Week: 7 days    Minutes of Exercise per Session: 20 min  Stress: Stress Concern Present (12/09/2021)   [place]    Feeling of Stress : Very much  Social Connections: Moderately Isolated (12/09/2021)   Social Connection and Isolation Panel [NHANES]    Frequency of Communication with  Friends and Family: Never    Frequency of Social Gatherings with Friends and Family: Once a week    Attends Religious Services: More than 4 times per year    Active Member of [employer] or Organizations: No    Attends Club or  Organization Meetings: Never    Marital Status: Married  Human resources officer Violence: Not At Risk (12/09/2021)   Humiliation, Afraid, Rape, and Kick questionnaire    Fear of Current or Ex-Partner: No    Emotionally Abused: No    Physically Abused: No    Sexually Abused: No    FAMILY HISTORY: Family History  Problem Relation Age of Onset   Cancer Mother        leukemia   Cancer Father 10       colon   Heart disease Father    Heart attack Father 15       died from MI   Hyperlipidemia Father    Hypertension Father    Cancer Sister 51       ovarian   Depression Sister    Cancer Brother        CLL   Breast cancer Paternal Aunt        65's   Migraines Neg Hx    Thyroid cancer Neg Hx     ALLERGIES:  is allergic to prednisone, amiodarone, hydrocodone-acetaminophen, oxycodone, tapentadol, tramadol, adhesive [tape], and latex.  MEDICATIONS:  Current Outpatient Medications  Medication Sig Dispense Refill   acetaminophen (TYLENOL) 500 MG tablet Take 500-1,000 mg by mouth every 6 (six) hours as needed for mild pain or moderate pain.     ALPRAZolam (XANAX) 0.25 MG tablet TAKE 1 TABLET BY MOUTH 2 TIMES DAILY AS NEEDED FOR ANXIETY. 60 tablet 2   apixaban (ELIQUIS) 5 MG TABS tablet TAKE 1 TABLET BY MOUTH TWICE A DAY 180 tablet 1   bisoprolol (ZEBETA) 5 MG tablet Take 5 mg by mouth in the morning and at bedtime.     busPIRone (BUSPAR) 5 MG tablet TAKE 1 TABLET BY MOUTH 3 TIMES DAILY AS NEEDED. 60 tablet 1   Cholecalciferol (VITAMIN D3) 5000 units CAPS Take 5,000 Units by mouth daily.     clotrimazole (LOTRIMIN) 1 % cream Apply 1 application topically 2 (two) times daily. 30 g 0   FLUoxetine (PROZAC) 20 MG capsule Take 1 capsule (20 mg total) by mouth every morning. 90  capsule 3   furosemide (LASIX) 20 MG tablet TAKE 1 TABLET BY MOUTH TWICE A DAY 180 tablet 0   gabapentin (NEURONTIN) 100 MG capsule TAKE 1 CAPSULE (100 MG TOTAL) BY MOUTH THREE TIMES DAILY. 270 capsule 0   KLOR-CON M20 20 MEQ tablet TAKE 1 TABLET BY MOUTH EVERY DAY 90 tablet 1   Multiple Vitamin (MULTIVITAMIN) tablet Take 1 tablet by mouth daily.     nystatin (MYCOSTATIN/NYSTOP) powder Apply 1 application. topically 3 (three) times daily. 60 g 11   nystatin cream (MYCOSTATIN) Apply 1 application. topically 2 (two) times daily. Prn abdomen 60 g 5   ranolazine (RANEXA) 500 MG 12 hr tablet TAKE 1 TABLET BY MOUTH TWICE A DAY 180 tablet 1   rOPINIRole (REQUIP) 2 MG tablet TAKE 1 TABLET BY MOUTH EVERYDAY AT BEDTIME 90 tablet 1   rosuvastatin (CRESTOR) 20 MG tablet TAKE 1 TABLET BY MOUTH EVERY DAY 90 tablet 0   rosuvastatin (CRESTOR) 40 MG tablet Take 1 tablet (40 mg total) by mouth daily. 90 tablet 3   azelastine (ASTELIN) 0.1 % nasal spray Place 1-2 sprays into both nostrils daily as needed for rhinitis. (Patient not taking: Reported on 05/04/2022)     diphenhydrAMINE (BENADRYL) 25 mg capsule Take 1 capsule (25 mg total) by mouth every 8 (eight) hours as needed for itching or allergies. (Patient not taking:  Reported on 05/04/2022) 30 capsule 0   esomeprazole (NEXIUM) 20 MG capsule Take 20 mg by mouth daily as needed (GERD symptoms). (Patient not taking: Reported on 05/04/2022)     hydrocortisone 2.5 % ointment Apply topically 2 (two) times daily. prn (Patient not taking: Reported on 05/04/2022) 60 g 2   loperamide (IMODIUM) 2 MG capsule Take 1 capsule (2 mg total) by mouth See admin instructions. Initial: 4 mg, followed by 2 mg after each loose stool; maximum: 16 mg/day (Patient not taking: Reported on 05/04/2022) 60 capsule 1   meclizine (ANTIVERT) 12.5 MG tablet Take 1 tablet (12.5 mg total) by mouth 3 (three) times daily as needed for dizziness. (Patient not taking: Reported on 05/04/2022) 30 tablet 0    ondansetron (ZOFRAN) 8 MG tablet TAKE 1 TABLET BY MOUTH EVERY 8 HOURS AS NEEDED FOR NAUSEA OR VOMITING. (Patient not taking: Reported on 05/04/2022) 60 tablet 0   promethazine (PHENERGAN) 25 MG tablet Take 1 tablet (25 mg total) by mouth every 6 (six) hours as needed for nausea or vomiting. (Patient not taking: Reported on 05/04/2022) 60 tablet 0   rizatriptan (MAXALT) 10 MG tablet TAKE 1 TABLET (10 MG TOTAL) BY MOUTH AS NEEDED FOR MIGRAINE. MAY REPEAT IN 2 HOURS IF NEEDED (Patient not taking: Reported on 05/04/2022) 10 tablet 0   silver sulfADIAZINE (SILVADENE) 1 % cream Apply 1 application. topically 2 (two) times daily. (Patient not taking: Reported on 05/04/2022) 50 g 3   No current facility-administered medications for this visit.     PHYSICAL EXAMINATION: ECOG PERFORMANCE STATUS: 1 - Symptomatic but completely ambulatory Vitals:   05/04/22 1346  BP: (!) 125/91  Pulse: (!) 50  Temp: (!) 96 F (35.6 C)  SpO2: 97%    Filed Weights   05/04/22 1346  Weight: 259 lb 9.6 oz (117.8 kg)     Physical Exam Constitutional:      General: She is not in acute distress.    Appearance: She is obese.  HENT:     Head: Normocephalic and atraumatic.  Eyes:     General: No scleral icterus. Cardiovascular:     Rate and Rhythm: Normal rate. Rhythm irregular.     Heart sounds: Normal heart sounds.  Pulmonary:     Effort: Pulmonary effort is normal. No respiratory distress.     Breath sounds: No wheezing.  Abdominal:     General: Bowel sounds are normal. There is no distension.     Palpations: Abdomen is soft.  Musculoskeletal:        General: No deformity. Normal range of motion.     Cervical back: Normal range of motion and neck supple.     Comments: 1+ edema bilaterally in lower extremity Small left scapular soft tissue mass,   Skin:    General: Skin is warm and dry.     Findings: No erythema or rash.  Neurological:     Mental Status: She is alert and oriented to person, place,  and time. Mental status is at baseline.     Cranial Nerves: No cranial nerve deficit.     Coordination: Coordination normal.  Psychiatric:        Mood and Affect: Mood normal.     . LABORATORY DATA:  I have reviewed the data as listed    Latest Ref Rng & Units 05/04/2022    1:28 PM 04/13/2022   10:13 AM 03/23/2022    8:33 AM  CBC  WBC 4.0 - 10.5 K/uL 5.6  4.7  6.0   Hemoglobin 12.0 - 15.0 g/dL 14.0  12.7  12.7   Hematocrit 36.0 - 46.0 % 41.9  37.7  38.3   Platelets 150 - 400 K/uL 177  152  183       Latest Ref Rng & Units 05/04/2022    1:28 PM 04/13/2022   10:13 AM 03/23/2022    8:33 AM  CMP  Glucose 70 - 99 mg/dL 105  88  95   BUN 8 - 23 mg/dL _0 Creatinine 0.44 - 1.00 mg/dL 0.91  0.83  0.94   Sodium 135 - 145 mmol/L 140  138  140   Potassium 3.5 - 5.1 mmol/L 3.9  3.9  3.6   Chloride 98 - 111 mmol/L 105  105  106   CO2 22 - 32 mmol/L _1 Calcium 8.9 - 10.3 mg/dL 8.6  8.6  8.3   Total Protein 6.5 - 8.1 g/dL 7.1  7.1  6.9   Total Bilirubin 0.3 - 1.2 mg/dL 0.5  0.8  0.6   Alkaline Phos 38 - 126 U/L 74  76  69   AST 15 - 41 U/L _2 ALT 0 - 44 U/L _3 RADIOGRAPHIC STUDIES: I have personally reviewed the radiological images as listed and agreed with the findings in the report. No results found.

## 2022-05-04 NOTE — Patient Instructions (Signed)
[facility]  Discharge Instructions: Thank you for choosing [facility] to provide your oncology and hematology care.  If you have a lab appointment with the Cancer Center, please go directly to the Cancer Center and check in at the registration area.  Wear comfortable clothing and clothing appropriate for easy access to any Portacath or PICC line.   We strive to give you quality time with your provider. You may need to reschedule your appointment if you arrive late (15 or more minutes).  Arriving late affects you and other patients whose appointments are after yours.  Also, if you miss three or more appointments without notifying the office, you may be dismissed from the clinic at the provider's discretion.      For prescription refill requests, have your pharmacy contact our office and allow 72 hours for refills to be completed.    Today you received the following chemotherapy and/or immunotherapy agents Kan[place]inti   To help prevent nausea and vomiting after your treatment, we encourage you to take your nausea medication as directed.  BELOW ARE SYMPTOMS THAT SHOULD BE REPORTED IMMEDIATELY: *FEVER GREATER THAN 100.4 F (38 C) OR HIGHER *CHILLS OR SWEATING *NAUSEA AND VOMITING THAT IS NOT CONTROLLED WITH YOUR NAUSEA MEDICATION *UNUSUAL SHORTNESS OF BREATH *UNUSUAL BRUISING OR BLEEDING *URINARY PROBLEMS (pain or burning when urinating, or frequent urination) *BOWEL PROBLEMS (unusual diarrhea, constipation, pain near the anus) TENDERNESS IN MOUTH AND THROAT WITH OR WITHOUT PRESENCE OF ULCERS (sore throat, sores in mouth, or a toothache) UNUSUAL RASH, SWELLING OR PAIN  UNUSUAL VAGINAL DISCHARGE OR ITCHING   Items with * indicate a potential emergency and should be followed up as soon as possible or go to the Emergency Department if any problems should occur.  Please show the CHEMOTHERAPY ALERT CARD or IMMUNOTHERAPY ALERT CARD at check-in to the  Emergency Department and triage nurse.  Should you have questions after your visit or need to cancel or reschedule your appointment, please contact [facility]  [phone] and follow the prompts.  Office hours are 8:00 a.m. to 4:30 p.m. Monday - Friday. Please note that voicemails left after 4:00 p.m. may not be returned until the following business day.  We are closed weekends and major holidays. You have access to a nurse at all times for urgent questions. Please call the main number to the clinic [phone] and follow the prompts.  For any non-urgent questions, you may also contact your provider using MyChart. We now offer e-Visits for anyone 18 and older to request care online for non-urgent symptoms. For details visit [email or web address].   Also download the MyChart app! Go to the app store, search "MyChart", open the app, select [place], and log in with your MyChart username and password.  Masks are optional in the cancer centers. If you would like for your care team to wear a mask while they are taking care of you, please let them know. For doctor visits, patients may have with them one support person who is at least 69 years old. At this time, visitors are not allowed in the infusion area.   

## 2022-05-04 NOTE — Assessment & Plan Note (Signed)
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  She may increase dose of 200 mg at bedtime.  

## 2022-05-04 NOTE — Assessment & Plan Note (Signed)
#  Right breast invasive carcinoma pT1b pN0 ER negative, PR weakly positive HER 2 positive.  Patient does not tolerate adjuvant chemotherapy with Taxol and trastuzumab despite Taxol dose reduction.-->Status post adjuvant radiation--> maintenance Trastuzumab Labs are reviewed and discussed with patient. Proceed with trastuzumab maintenance. August 2023 Echo showed stable LVEF Obtain right diagnostic mammogram in December 2023 Repeat echocardiogram in November 2023. 

## 2022-05-07 NOTE — Progress Notes (Unsigned)
Date:  05/09/2022   ID:  Amber Barnes, DOB 25-Aug-1950, MRN [id number]  Patient Location:  [place] [id number]   Provider location:   [facility] [address], [place] office  PCP:  Amber Hawthorne, FNP  Cardiologist:  Amber Barnes [facility]  Chief Complaint  Patient presents with   6 month follow up    Patient c/o heaviness in chest, shortness of breath and A-Fib. Patient will need to have left knee surgery. Medications reviewed by the patient verbally.     History of Present Illness:    Amber Barnes is a 71 y.o. female past medical history of Paroxysmal atrial fibrillation, cardioversion 2018 Normal sinus rhythm did not hold on flecainide, changed to amiodarone  cardioversion, normal sinus rhythm obesity,  arthritis,  hyperlipidemia,  hypertension,  OSA , not on CPAP Migraines, anxiety Stress test January 2018 showing no ischemia, Ejection fraction 55-65% CHA2DS2-VASc of at least 3 (female, age, HTN). who presents for follow-up of her hyperlipidemia, hypertension, persistent atrial fibrillation  Last seen by myself in clinic May 2023  Weight stable since 5/23  Taking diltiazem PRN for afib Takes bisoprolol 5 BID Stopped amiodarone for GI upset 4/21 Increased SOB today, "went into afib today"  Continues to receive treatment for breast cancer on maintenance trastuzumab, finishes in January repeat echocardiogram every 3 months while on treatments per  oncology EF normal  Has to lose weight 20 pounds before knee surgery Instead was offered weight loss surgery, she is not interested Medication options are backordered at the pharmacy  Continues on Lasix twice a day Denies significant leg swelling  EKG personally reviewed by myself on todays visit Atrial fibrillation rate 100 bpm no significant ST-T wave changes  Other past medical history reviewed In hospital early January 2023 6 days of IV antibiotic for your  kidney infection Acute pyelonephritis and ureterovesical junction (UVJ) obstruction and Barnes hydroureteronephrosis --Dr. Diamantina Barnes of urology did urgent Bilateral ureteral stent placement.  bisoprolol and Cardizem are held due to your low heart rate.   Eliquis held due to significant blood in the urine. Was in atrial fib on arrival D/c on 07/17/2021  NSR 07/29/2021 when echo performed Normal ejection fraction greater than 60%  Hx of breast cancer HER2 positive in July 2022, underwent lumpectomy, lymph node biopsy, recent start of chemotherapy with weekly Taxol plus  trastuzumab .    Chemotherapy early September 2022 She presented to the emergency room September 26, anorexia, malaise, shortness of breath, atrial fibrillation Heart rate in the emergency room 137 bpm Started on broad-spectrum antibiotics for possible sepsis, neutropenic fevers Started on diltiazem infusion for rate control, has been continued on her Eliquis Heart rate running low 100 bpm, asymptomatic   cardioversion procedure 09/07/2016  Past Medical History:  Diagnosis Date   Achilles tendinitis    Acute medial meniscal tear    Allergy    Anxiety    Atrial fibrillation ([place])    a. new onset 07/2016; b. CHADS2VASc --> 4 (HTN, female, age, CHF); c. eliquis   CHF (congestive heart failure) (HCC)    Chronic anticoagulation    Apixaban   Chronic lower back pain    Colon polyp    Complication of anesthesia    Emergence delirium; hypoxia   COPD (chronic obstructive pulmonary disease) (HCC)    Cystic breast    Depression    Endometriosis    Fibrocystic breast disease    GERD (gastroesophageal reflux disease)  History of stress test    a. 07/2016 MV: EF 55-65%. Small, mild defect  in mid anteroseptal, apical septal, and apical locations. No ischemia-->low risk.   Hyperlipidemia    Hypertension    Insomnia    Lipoma    Malignant neoplasm of upper-outer quadrant of Barnes female breast (HCC)    Stage 1A invasive  mammary carcinoma (cT1b or T1c N0); ER (-), PR (+), HER2/neu (+)   Migraine    Mild Mitral regurgitation    a. 07/2016 Echo: EF 55-65%, no rwma, mild MR. Mildly dil LA. Nl RV fx.    Nephrolithiasis    OSA (obstructive sleep apnea)    non-compliant with nocturnal PAP therapy   Osteoarthritis    left knee   Osteopenia    Peptic ulcer disease    a. H. pylori   Pneumonia    RLS (restless legs syndrome)    Tendonitis    Thyroid disease    Vitamin D deficiency    Past Surgical History:  Procedure Laterality Date   ABDOMINAL HYSTERECTOMY     ovaries left   ACHILLES TENDON SURGERY     2012,01/2017   BACK SURGERY     fusion lumbar   BREAST BIOPSY Barnes 01/15/2021   Affirm biopsy-"X" clip-INVASIVE MAMMARY CARCINOMA   BREAST BIOPSY Barnes 01/15/2021   u/s bx-"venus" clip INVASIVE MAMMARY CARCINOMA   BREAST BIOPSY     BREAST LUMPECTOMY,RADIO FREQ LOCALIZER,AXILLARY SENTINEL LYMPH NODE BIOPSY Barnes 02/17/2021   Procedure: BREAST LUMPECTOMY,RADIO FREQ LOCALIZER,AXILLARY SENTINEL LYMPH NODE BIOPSY;  Surgeon: Ronny Bacon, MD;  Location: [facility];  Service: General;  Laterality: Barnes;   CARDIOVERSION N/A 09/07/2016   Procedure: CARDIOVERSION;  Surgeon: Minna Merritts, MD;  Location: [facility];  Service: Cardiovascular;  Laterality: N/A;   CARDIOVERSION N/A 10/19/2016   Procedure: CARDIOVERSION;  Surgeon: Minna Merritts, MD;  Location: [facility];  Service: Cardiovascular;  Laterality: N/A;   CARDIOVERSION N/A 12/14/2016   Procedure: CARDIOVERSION;  Surgeon: Minna Merritts, MD;  Location: [facility];  Service: Cardiovascular;  Laterality: N/A;   COLONOSCOPY     COLONOSCOPY WITH PROPOFOL N/A 05/13/2020   Procedure: COLONOSCOPY WITH PROPOFOL;  Surgeon: Toledo, Benay Pike, MD;  Location: [facility];  Service: Gastroenterology;  Laterality: N/A;  ELIQUIS   CYSTOSCOPY WITH STENT PLACEMENT Bilateral 07/12/2021   Procedure: CYSTOSCOPY WITH STENT PLACEMENT;  Surgeon: Billey Co, MD;   Location: [facility];  Service: Urology;  Laterality: Bilateral;   CYSTOSCOPY/URETEROSCOPY/HOLMIUM LASER/STENT PLACEMENT Bilateral 07/30/2021   Procedure: CYSTOSCOPY/URETEROSCOPY/HOLMIUM LASER/BILATERAL STENT REMOVAL;  Surgeon: Billey Co, MD;  Location: [facility];  Service: Urology;  Laterality: Bilateral;   ESOPHAGOGASTRODUODENOSCOPY     ESOPHAGOGASTRODUODENOSCOPY (EGD) WITH PROPOFOL N/A 05/13/2020   Procedure: ESOPHAGOGASTRODUODENOSCOPY (EGD) WITH PROPOFOL;  Surgeon: Toledo, Benay Pike, MD;  Location: [facility];  Service: Gastroenterology;  Laterality: N/A;   FOOT SURGERY Left    tendon   JOINT REPLACEMENT Barnes    hip   PORTA CATH INSERTION N/A 03/23/2021   Procedure: PORTA CATH INSERTION;  Surgeon: Katha Cabal, MD;  Location: [facility];  Service: Cardiovascular;  Laterality: N/A;   PORTACATH PLACEMENT N/A 03/23/2021   Procedure: ATTEMPTED INSERTION PORT-A-CATH;  Surgeon: Ronny Bacon, MD;  Location: [facility];  Service: General;  Laterality: N/A;   THYROIDECTOMY     thyroid lobectomy secondary to a benign nodule   TOTAL HIP ARTHROPLASTY Barnes 08/21/2018   Procedure: TOTAL HIP ARTHROPLASTY ANTERIOR APPROACH-Barnes CELL SAVER REQUESTED;  Surgeon: [name],  MD;  Location: [facility];  Service: Orthopedics;  Laterality: Barnes;      Allergies:   Prednisone, Amiodarone, Hydrocodone-acetaminophen, Oxycodone, Tapentadol, Tramadol, Adhesive [tape], and Latex   Social History   Tobacco Use   Smoking status: Former    Packs/day: 0.25    Years: 10.00    Total pack years: 2.50    Types: Cigarettes    Quit date: 2005    Years since quitting: 18.8   Smokeless tobacco: Never   Tobacco comments:    smoked for 10 years, 1 pack every 2-3 days  Vaping Use   Vaping Use: Never used  Substance Use Topics   Alcohol use: Not Currently    Alcohol/week: 1.0 standard drink of alcohol    Types: 1 Glasses of wine per week    Comment: rarely   Drug use: No     Current  Outpatient Medications on File Prior to Visit  Medication Sig Dispense Refill   acetaminophen (TYLENOL) 500 MG tablet Take 500-1,000 mg by mouth every 6 (six) hours as needed for mild pain or moderate pain.     ALPRAZolam (XANAX) 0.25 MG tablet TAKE 1 TABLET BY MOUTH 2 TIMES DAILY AS NEEDED FOR ANXIETY. 60 tablet 2   apixaban (ELIQUIS) 5 MG TABS tablet TAKE 1 TABLET BY MOUTH TWICE A DAY 180 tablet 1   bisoprolol (ZEBETA) 5 MG tablet Take 5 mg by mouth in the morning and at bedtime.     busPIRone (BUSPAR) 5 MG tablet TAKE 1 TABLET BY MOUTH 3 TIMES DAILY AS NEEDED. 60 tablet 1   Cholecalciferol (VITAMIN D3) 5000 units CAPS Take 5,000 Units by mouth daily.     clotrimazole (LOTRIMIN) 1 % cream Apply 1 application topically 2 (two) times daily. 30 g 0   diltiazem (CARDIZEM CD) 240 MG 24 hr capsule Take 240 mg by mouth as needed.     diphenhydrAMINE (BENADRYL) 25 mg capsule Take 1 capsule (25 mg total) by mouth every 8 (eight) hours as needed for itching or allergies. 30 capsule 0   esomeprazole (NEXIUM) 20 MG capsule Take 20 mg by mouth daily as needed (GERD symptoms).     FLUoxetine (PROZAC) 20 MG capsule Take 1 capsule (20 mg total) by mouth every morning. 90 capsule 3   furosemide (LASIX) 20 MG tablet TAKE 1 TABLET BY MOUTH TWICE A DAY 180 tablet 0   gabapentin (NEURONTIN) 100 MG capsule TAKE 1 CAPSULE (100 MG TOTAL) BY MOUTH THREE TIMES DAILY. 270 capsule 0   KLOR-CON M20 20 MEQ tablet TAKE 1 TABLET BY MOUTH EVERY DAY 90 tablet 1   Multiple Vitamin (MULTIVITAMIN) tablet Take 1 tablet by mouth daily.     nystatin (MYCOSTATIN/NYSTOP) powder Apply 1 application. topically 3 (three) times daily. 60 g 11   nystatin cream (MYCOSTATIN) Apply 1 application. topically 2 (two) times daily. Prn abdomen 60 g 5   ondansetron (ZOFRAN) 8 MG tablet TAKE 1 TABLET BY MOUTH EVERY 8 HOURS AS NEEDED FOR NAUSEA OR VOMITING. 60 tablet 0   promethazine (PHENERGAN) 25 MG tablet Take 1 tablet (25 mg total) by mouth every  6 (six) hours as needed for nausea or vomiting. 60 tablet 0   ranolazine (RANEXA) 500 MG 12 hr tablet TAKE 1 TABLET BY MOUTH TWICE A DAY 180 tablet 1   rizatriptan (MAXALT) 10 MG tablet TAKE 1 TABLET (10 MG TOTAL) BY MOUTH AS NEEDED FOR MIGRAINE. MAY REPEAT IN 2 HOURS IF NEEDED 10 tablet 0   rOPINIRole (  REQUIP) 2 MG tablet TAKE 1 TABLET BY MOUTH EVERYDAY AT BEDTIME 90 tablet 1   rosuvastatin (CRESTOR) 40 MG tablet Take 1 tablet (40 mg total) by mouth daily. 90 tablet 3   meclizine (ANTIVERT) 12.5 MG tablet Take 1 tablet (12.5 mg total) by mouth 3 (three) times daily as needed for dizziness. (Patient not taking: Reported on 05/09/2022) 30 tablet 0   [DISCONTINUED] prochlorperazine (COMPAZINE) 10 MG tablet Take 1 tablet (10 mg total) by mouth every 6 (six) hours as needed (Nausea or vomiting). 30 tablet 1   No current facility-administered medications on file prior to visit.     Family Hx: The patient's family history includes Breast cancer in her paternal aunt; Cancer in her brother and mother; Cancer (age of onset: 107) in her sister; Cancer (age of onset: 13) in her father; Depression in her sister; Heart attack (age of onset: 18) in her father; Heart disease in her father; Hyperlipidemia in her father; Hypertension in her father. There is no history of Migraines or Thyroid cancer.  ROS:   Please see the history of present illness.    Review of Systems  Constitutional: Negative.   HENT: Negative.    Respiratory: Negative.    Cardiovascular: Negative.   Gastrointestinal: Negative.   Musculoskeletal: Negative.   Neurological: Negative.   Psychiatric/Behavioral: Negative.    All other systems reviewed and are negative.    Labs/Other Tests and Data Reviewed:    Recent Labs: 07/02/2021: TSH 2.060 07/12/2021: B Natriuretic Peptide 58.1 07/17/2021: Magnesium 2.1 05/04/2022: ALT 17; BUN 20; Creatinine, Ser 0.91; Hemoglobin 14.0; Platelets 177; Potassium 3.9; Sodium 140   Recent Lipid  Panel Lab Results  Component Value Date/Time   CHOL 192 03/11/2022 11:10 AM   TRIG 114.0 03/11/2022 11:10 AM   HDL 60.50 03/11/2022 11:10 AM   CHOLHDL 3 03/11/2022 11:10 AM   LDLCALC 109 (H) 03/11/2022 11:10 AM    Wt Readings from Last 3 Encounters:  05/09/22 260 lb 4 oz (118 kg)  05/04/22 259 lb 9.6 oz (117.8 kg)  04/13/22 261 lb (118.4 kg)     Exam:    Vital Signs: Vital signs may also be detailed in the HPI BP 116/80 (BP Location: Left Wrist, Patient Position: Sitting, Cuff Size: Normal)   Pulse 100   Ht _0  (1.651 m)   Wt 260 lb 4 oz (118 kg)   SpO2 96%   BMI 43.31 kg/m   Constitutional:  oriented to person, place, and time. No distress.  HENT:  Head: Grossly normal Eyes:  no discharge. No scleral icterus.  Neck: No JVD, no carotid bruits  Cardiovascular: Regular rate and rhythm, no murmurs appreciated Pulmonary/Chest: Clear to auscultation bilaterally, no wheezes or rails Abdominal: Soft.  no distension.  no tenderness.  Musculoskeletal: Normal range of motion Neurological:  normal muscle tone. Coordination normal. No atrophy Skin: Skin warm and dry Psychiatric: normal affect, pleasant   ASSESSMENT & PLAN:    Problem List Items Addressed This Visit       Cardiology Problems   Hypertension   Relevant Medications   diltiazem (CARDIZEM CD) 240 MG 24 hr capsule   Other Relevant Orders   EKG 12-Lead   Chronic diastolic CHF (congestive heart failure) (HCC) - Primary   Relevant Medications   diltiazem (CARDIZEM CD) 240 MG 24 hr capsule   Other Relevant Orders   EKG 12-Lead   Atrial fibrillation with RVR (HCC)   Relevant Medications   diltiazem (CARDIZEM CD) 240 MG  24 hr capsule   Other Relevant Orders   EKG 12-Lead     Other   CKD (chronic kidney disease)   Relevant Orders   EKG 12-Lead   HER2-positive carcinoma of breast ([place])   Other Visit Diagnoses     Hyperlipidemia LDL goal <100       Relevant Medications   diltiazem (CARDIZEM CD) 240 MG 24  hr capsule   Essential hypertension       Relevant Medications   diltiazem (CARDIZEM CD) 240 MG 24 hr capsule     Preop cardiovascular evaluation for knee surgery Reports she is unable to have surgery until she loses 20 pounds From a cardiovascular perspective we would like to get her back to normal sinus rhythm Suggested she follow directions as below  Paroxysmal atrial fibrillation Having paroxysmal atrial fibrillation Amiodarone previously held 2021 it would appear for GI upset, nausea She is taking bisoprolol 5 twice daily Taking diltiazem ER 240 but only as needed for leg swelling On Ranexa, Eliquis Recommend she increase bisoprolol up to 10 twice daily Could consider flecainide to maintain normal rhythm and follow-up  Breast cancer Undergoing infusion, scheduled to finish January 2024 Echocardiogram every 3 months, stable  Diastolic CHF Improved in the setting of maintaining normal rhythm, on Lasix daily Euvolemic    Total encounter time more than 30 minutes  Greater than 50% was spent in counseling and coordination of care with the patient    Signed, Ida Rogue, [place] [id number], [place], [place] [address]

## 2022-05-09 ENCOUNTER — Encounter: Payer: Self-pay | Admitting: Cardiovascular Disease

## 2022-05-09 ENCOUNTER — Encounter (INDEPENDENT_AMBULATORY_CARE_PROVIDER_SITE_OTHER): Payer: Self-pay

## 2022-05-09 ENCOUNTER — Ambulatory Visit: Payer: Medicare Other | Attending: Cardiovascular Disease | Admitting: Cardiovascular Disease

## 2022-05-09 VITALS — BP 116/80 | HR 100 | Ht 65.0 in | Wt 260.2 lb

## 2022-05-09 DIAGNOSIS — C50919 Malignant neoplasm of unspecified site of unspecified female breast: Secondary | ICD-10-CM

## 2022-05-09 DIAGNOSIS — M1712 Unilateral primary osteoarthritis, left knee: Secondary | ICD-10-CM | POA: Diagnosis not present

## 2022-05-09 DIAGNOSIS — I10 Essential (primary) hypertension: Secondary | ICD-10-CM | POA: Diagnosis not present

## 2022-05-09 DIAGNOSIS — N189 Chronic kidney disease, unspecified: Secondary | ICD-10-CM | POA: Diagnosis not present

## 2022-05-09 DIAGNOSIS — I509 Heart failure, unspecified: Secondary | ICD-10-CM | POA: Diagnosis not present

## 2022-05-09 DIAGNOSIS — J449 Chronic obstructive pulmonary disease, unspecified: Secondary | ICD-10-CM | POA: Diagnosis not present

## 2022-05-09 DIAGNOSIS — E785 Hyperlipidemia, unspecified: Secondary | ICD-10-CM | POA: Diagnosis not present

## 2022-05-09 DIAGNOSIS — G629 Polyneuropathy, unspecified: Secondary | ICD-10-CM | POA: Diagnosis not present

## 2022-05-09 DIAGNOSIS — I4891 Unspecified atrial fibrillation: Secondary | ICD-10-CM | POA: Diagnosis not present

## 2022-05-09 DIAGNOSIS — C50911 Malignant neoplasm of unspecified site of right female breast: Secondary | ICD-10-CM | POA: Diagnosis not present

## 2022-05-09 DIAGNOSIS — I5032 Chronic diastolic (congestive) heart failure: Secondary | ICD-10-CM | POA: Diagnosis not present

## 2022-05-09 NOTE — Patient Instructions (Addendum)
Medication Instructions:  - Your physician has recommended you make the following change in your medication:   1) INCREASE Bisoprolol 5 mg: - take 2 tablets (10 mg) by mouth twice a day  If you need a refill on your cardiac medications before your next appointment, please call your pharmacy.    Lab work: No new labs needed   Testing/Procedures: No new testing needed   Follow-Up: At [facility], you and your health needs are our priority.  As part of our continuing mission to provide you with exceptional heart care, we have created designated Provider Care Teams.  These Care Teams include your primary Cardiologist (physician) and Advanced Practice Providers (APPs -  Physician Assistants and Nurse Practitioners) who all work together to provide you with the care you need, when you need it.  You will need a follow up appointment in 6 months  Providers on your designated Care Team:   Murray Hodgkins, NP Christell Faith, PA-C Cadence Kathlen Mody, [place]  COVID-19 Vaccine Information can be found at: [email or web address] For questions related to vaccine distribution or appointments, please email [email or web address] or call [phone].

## 2022-05-10 ENCOUNTER — Other Ambulatory Visit: Payer: Self-pay

## 2022-05-12 ENCOUNTER — Telehealth: Payer: Self-pay | Admitting: Cardiovascular Disease

## 2022-05-12 DIAGNOSIS — Z5181 Encounter for therapeutic drug level monitoring: Secondary | ICD-10-CM

## 2022-05-12 NOTE — Telephone Encounter (Signed)
Pt c/o medication issue:  1. Name of Medication:   bisoprolol (ZEBETA) 5 MG tablet   2. How are you currently taking this medication (dosage and times per day)?   As prescribed  3. Are you having a reaction (difficulty breathing--STAT)?   No  4. What is your medication issue?   Patient stated the increased medication has not taken her out of Afib.

## 2022-05-12 NOTE — Telephone Encounter (Signed)
Attempted to call the patient. No answer- I left a detailed message that I will forward her message to Dr. [name] and we will follow up with her tomorrow as long as everything is stable for her.  Otherwise, if she has questions or concerns over night I asked that she call the office back and follow the prompts to be directed to the physician on call.

## 2022-05-13 ENCOUNTER — Other Ambulatory Visit: Payer: Self-pay | Admitting: Cardiovascular Disease

## 2022-05-13 MED ORDER — FLECAINIDE ACETATE 100 MG PO TABS: 100.0000 mg | ORAL_TABLET | Freq: Two times a day (BID) | ORAL | 3 refills | Status: DC

## 2022-05-13 NOTE — Telephone Encounter (Signed)
Reviewed results and recommendations with patient. She verbalized understanding and was agreeable with plan. She did request that I send her these instructions via My Chart. She had no further questions at this time.     Minna Merritts, MD  P Cv Div [place] Triage Caller: Unspecified (Yesterday,  1:27 PM) Would start flecainide 100 twice daily Stay on bisoprolol 10 twice daily Would call us and let us know if she is not going back to normal sinus rhythm after 1 week If she remains in atrial fibrillation on flecainide, we will probably need a cardioversion on bisoprolol flecainide mix Need to arrange Flecainide treadmill 2 weeks to make sure no inducible arrhythmia Thx TGollan

## 2022-05-17 NOTE — Telephone Encounter (Signed)
Minna Merritts, MD  Stana Bunting, RN  Often in atrial fibrillation can have fluid retention and put people into congestive heart failure Would recommend Lasix 40 twice a day for 3 days, moderate fluid intake Once feeling better would go back to Lasix 20 twice a day Thx TG

## 2022-05-17 NOTE — Telephone Encounter (Signed)
Left message for pt to call back  °

## 2022-05-17 NOTE — Telephone Encounter (Signed)
Spoke w/ pt. She reports fluid retention since starting flecainide w/ 4 lb wt gain since last night. She reports SOB this am, w/ trouble breathing and feeling like she is drowning when lying down. Her feet and hands are swollen and she can't take her rings off. She drinks 4-5 glasses of water a day and feels like it's "probably not enough". She feels like she is back in NSR w/ HR 60-65.   Also, she is sched for treadmill stress test on 06/07/22, but she reports that she either uses a cane or wheelchair due to her knees and does not feel that she can walk on treadmill.   She fell a couple of days ago b/c her knees locked up.  Advised her that I will make Dr. [name] aware and call her back w/ his recommendations.

## 2022-05-17 NOTE — Telephone Encounter (Signed)
Pt c/o swelling: STAT is pt has developed SOB within 24 hours  How much weight have you gained and in what time span?  4 lbs in 1 day  If swelling, where is the swelling located?  Hands, legs, feet--patient states it feel like she has retention all over her body  Are you currently taking a fluid pill?  Furosemide, twice daily as prescribed Patient mentions she has already taken it twice this morning   Are you currently SOB?  Not currently but patient mentions she has been more SOB  Do you have a log of your daily weights (if so, list)?  11/06: 258 11/07: 262  Have you gained 3 pounds in a day or 5 pounds in a week?   Have you traveled recently?   No

## 2022-05-18 NOTE — Telephone Encounter (Signed)
Spoke w/ pt and advised her of Dr. [name] recommendations. She did not take her flecainide yet today and is considering stopping it b/c she thinks it is causing her swelling. Advised her to resume and follow Dr. [name] instructions and call if sx persist. She is agreeable and is appreciative of the call.

## 2022-05-24 ENCOUNTER — Telehealth: Payer: Self-pay | Admitting: Cardiovascular Disease

## 2022-05-24 ENCOUNTER — Other Ambulatory Visit: Payer: Self-pay | Admitting: *Deleted

## 2022-05-24 MED ORDER — BISOPROLOL FUMARATE 10 MG PO TABS: 10.0000 mg | ORAL_TABLET | Freq: Two times a day (BID) | ORAL | 3 refills | Status: DC

## 2022-05-24 MED ORDER — BISOPROLOL FUMARATE 5 MG PO TABS: 10.0000 mg | ORAL_TABLET | Freq: Two times a day (BID) | ORAL | 0 refills | Status: DC

## 2022-05-24 NOTE — Telephone Encounter (Signed)
*  STAT* If patient is at the pharmacy, call can be transferred to refill team.   1. Which medications need to be refilled? (please list name of each medication and dose if known) bisoprolol (ZEBETA) 5 MG tablet  2. Which pharmacy/location (including street and city if local pharmacy) is medication to be sent to? [id number] [address] [place]  3. Do they need a 30 day or 90 day supply? 90  Completely out of this medications.

## 2022-05-24 NOTE — Telephone Encounter (Signed)
Requested Prescriptions   Signed Prescriptions Disp Refills   bisoprolol (ZEBETA) 5 MG tablet 360 tablet 0    Sig: Take 2 tablets (10 mg total) by mouth 2 (two) times daily. Take 2 tablets (10 mg) by mouth twice daily as direct    Authorizing Provider: Minna Merritts    Ordering User: Raelene Bott, Vana Arif L

## 2022-05-25 ENCOUNTER — Ambulatory Visit: Payer: Medicare Other | Admitting: Oncology

## 2022-05-25 ENCOUNTER — Other Ambulatory Visit: Payer: Medicare Other

## 2022-05-25 ENCOUNTER — Ambulatory Visit: Payer: Medicare Other

## 2022-05-25 ENCOUNTER — Encounter: Payer: Self-pay | Admitting: Oncology

## 2022-05-25 ENCOUNTER — Inpatient Hospital Stay (HOSPITAL_BASED_OUTPATIENT_CLINIC_OR_DEPARTMENT_OTHER): Payer: Medicare Other | Admitting: Oncology

## 2022-05-25 ENCOUNTER — Inpatient Hospital Stay: Payer: Medicare Other

## 2022-05-25 ENCOUNTER — Inpatient Hospital Stay: Payer: Medicare Other | Attending: Oncology

## 2022-05-25 VITALS — BP 161/84 | HR 56 | Temp 96.9°F | Wt 258.0 lb

## 2022-05-25 DIAGNOSIS — Z923 Personal history of irradiation: Secondary | ICD-10-CM | POA: Insufficient documentation

## 2022-05-25 DIAGNOSIS — Z5112 Encounter for antineoplastic immunotherapy: Secondary | ICD-10-CM

## 2022-05-25 DIAGNOSIS — Z803 Family history of malignant neoplasm of breast: Secondary | ICD-10-CM | POA: Diagnosis not present

## 2022-05-25 DIAGNOSIS — I4891 Unspecified atrial fibrillation: Secondary | ICD-10-CM | POA: Insufficient documentation

## 2022-05-25 DIAGNOSIS — C50411 Malignant neoplasm of upper-outer quadrant of right female breast: Secondary | ICD-10-CM | POA: Diagnosis not present

## 2022-05-25 DIAGNOSIS — Z806 Family history of leukemia: Secondary | ICD-10-CM | POA: Diagnosis not present

## 2022-05-25 DIAGNOSIS — Z87891 Personal history of nicotine dependence: Secondary | ICD-10-CM | POA: Insufficient documentation

## 2022-05-25 DIAGNOSIS — E785 Hyperlipidemia, unspecified: Secondary | ICD-10-CM | POA: Insufficient documentation

## 2022-05-25 DIAGNOSIS — Z7901 Long term (current) use of anticoagulants: Secondary | ICD-10-CM | POA: Diagnosis not present

## 2022-05-25 DIAGNOSIS — Z171 Estrogen receptor negative status [ER-]: Secondary | ICD-10-CM | POA: Diagnosis not present

## 2022-05-25 DIAGNOSIS — C50919 Malignant neoplasm of unspecified site of unspecified female breast: Secondary | ICD-10-CM | POA: Diagnosis not present

## 2022-05-25 DIAGNOSIS — E559 Vitamin D deficiency, unspecified: Secondary | ICD-10-CM | POA: Diagnosis not present

## 2022-05-25 DIAGNOSIS — M8580 Other specified disorders of bone density and structure, unspecified site: Secondary | ICD-10-CM | POA: Insufficient documentation

## 2022-05-25 DIAGNOSIS — K219 Gastro-esophageal reflux disease without esophagitis: Secondary | ICD-10-CM | POA: Diagnosis not present

## 2022-05-25 DIAGNOSIS — I480 Paroxysmal atrial fibrillation: Secondary | ICD-10-CM

## 2022-05-25 DIAGNOSIS — Z79899 Other long term (current) drug therapy: Secondary | ICD-10-CM | POA: Insufficient documentation

## 2022-05-25 DIAGNOSIS — G629 Polyneuropathy, unspecified: Secondary | ICD-10-CM | POA: Insufficient documentation

## 2022-05-25 DIAGNOSIS — J449 Chronic obstructive pulmonary disease, unspecified: Secondary | ICD-10-CM | POA: Diagnosis not present

## 2022-05-25 DIAGNOSIS — I10 Essential (primary) hypertension: Secondary | ICD-10-CM | POA: Diagnosis not present

## 2022-05-25 DIAGNOSIS — I509 Heart failure, unspecified: Secondary | ICD-10-CM | POA: Diagnosis not present

## 2022-05-25 DIAGNOSIS — L989 Disorder of the skin and subcutaneous tissue, unspecified: Secondary | ICD-10-CM | POA: Diagnosis not present

## 2022-05-25 LAB — CBC WITH DIFFERENTIAL/PLATELET
Abs Immature Granulocytes: 0.02 10*3/uL (ref 0.00–0.07)
Basophils Absolute: 0 10*3/uL (ref 0.0–0.1)
Basophils Relative: 0 %
Eosinophils Absolute: 0.1 10*3/uL (ref 0.0–0.5)
Eosinophils Relative: 1 %
HCT: 40.2 % (ref 36.0–46.0)
Hemoglobin: 13.3 g/dL (ref 12.0–15.0)
Immature Granulocytes: 0 %
Lymphocytes Relative: 25 %
Lymphs Abs: 1.6 10*3/uL (ref 0.7–4.0)
MCH: 30.6 pg (ref 26.0–34.0)
MCHC: 33.1 g/dL (ref 30.0–36.0)
MCV: 92.4 fL (ref 80.0–100.0)
Monocytes Absolute: 0.4 10*3/uL (ref 0.1–1.0)
Monocytes Relative: 7 %
Neutro Abs: 4.2 10*3/uL (ref 1.7–7.7)
Neutrophils Relative %: 67 %
Platelets: 179 10*3/uL (ref 150–400)
RBC: 4.35 MIL/uL (ref 3.87–5.11)
RDW: 12.9 % (ref 11.5–15.5)
WBC: 6.3 10*3/uL (ref 4.0–10.5)
nRBC: 0 % (ref 0.0–0.2)

## 2022-05-25 LAB — COMPREHENSIVE METABOLIC PANEL
ALT: 15 U/L (ref 0–44)
AST: 22 U/L (ref 15–41)
Albumin: 3.9 g/dL (ref 3.5–5.0)
Alkaline Phosphatase: 77 U/L (ref 38–126)
Anion gap: 8 (ref 5–15)
BUN: 18 mg/dL (ref 8–23)
CO2: 28 mmol/L (ref 22–32)
Calcium: 9.1 mg/dL (ref 8.9–10.3)
Chloride: 102 mmol/L (ref 98–111)
Creatinine, Ser: 0.93 mg/dL (ref 0.44–1.00)
GFR, Estimated: >60 mL/min (ref >60)
Glucose, Bld: 92 mg/dL (ref 70–99)
Potassium: 3.8 mmol/L (ref 3.5–5.1)
Sodium: 138 mmol/L (ref 135–145)
Total Bilirubin: 0.6 mg/dL (ref 0.3–1.2)
Total Protein: 7.4 g/dL (ref 6.5–8.1)

## 2022-05-25 MED ORDER — HEPARIN SOD (PORK) LOCK FLUSH 100 UNIT/ML IV SOLN
500.0000 [IU] | Freq: Once | INTRAVENOUS | Status: AC | PRN
  Administered 2022-05-25: 500 [IU]
  Filled 2022-05-25: qty 5

## 2022-05-25 MED ORDER — TRASTUZUMAB-ANNS CHEMO 150 MG IV SOLR
6.0000 mg/kg | Freq: Once | INTRAVENOUS | Status: AC
  Administered 2022-05-25: 693 mg via INTRAVENOUS
  Filled 2022-05-25: qty 33

## 2022-05-25 MED ORDER — SODIUM CHLORIDE 0.9 % IV SOLN
Freq: Once | INTRAVENOUS | Status: AC
  Filled 2022-05-25: qty 250

## 2022-05-25 MED ORDER — DIPHENHYDRAMINE HCL 25 MG PO CAPS
50.0000 mg | ORAL_CAPSULE | Freq: Once | ORAL | Status: AC
  Administered 2022-05-25: 50 mg via ORAL
  Filled 2022-05-25: qty 2

## 2022-05-25 MED ORDER — ACETAMINOPHEN 325 MG PO TABS
650.0000 mg | ORAL_TABLET | Freq: Once | ORAL | Status: AC
  Administered 2022-05-25: 650 mg via ORAL
  Filled 2022-05-25: qty 2

## 2022-05-25 NOTE — Progress Notes (Signed)
Hematology/Oncology Progress note Telephone:([phone] [phone]) [phone]      ASSESSMENT & PLAN:   Cancer Staging  Malignant neoplasm of upper-outer quadrant of right female breast Amber Barnes) Staging form: Breast, AJCC 8th Edition - Pathologic stage from 03/03/2021: Stage IA (pT1b, pN0, cM0, G3, ER-, PR+, HER2+) - Signed by Amber Server, MD on 03/03/2021   Malignant neoplasm of upper-outer quadrant of right female breast Amber Barnes) #Right breast invasive carcinoma pT1b pN0 ER negative, PR weakly positive HER 2 positive.  Patient does not tolerate adjuvant chemotherapy with Taxol and trastuzumab despite Taxol dose reduction.-->Status post adjuvant radiation--> maintenance Trastuzumab Labs are reviewed and discussed with patient. Proceed with trastuzumab maintenance. August 2023 Echo showed stable LVEF Obtain right diagnostic mammogram in December 2023 Repeat echocardiogram in November 2023.  Atrial fibrillation Amber Barnes) Follow-up with cardiology.  Patient is on Eliquis 5 mg twice daily. Recently started on Flecainide.  Neuropathy Pre-existing neuropathy in her fingertips bilaterally- secondary to cervical spine radiculopathy continue 100 mg 3 times daily.  She may increase dose of 200 mg at bedtime.   Encounter for monoclonal antibody treatment for malignancy Treatment plan as needed.   Orders Placed This Encounter  Procedures   Ambulatory referral to Dermatology    Referral Priority:   Routine    Referral Type:   Consultation    Referral Reason:   Specialty Services Required    Requested Specialty:   Dermatology    Number of Visits Requested:   1     Follow up  3 weeks lab MD trastuzumab.   All questions were answered. The patient knows to call the clinic with any problems, questions or concerns.  Amber Server, MD, PhD Amber Barnes Hematology Oncology 05/25/2022   CHIEF COMPLAINTS/REASON FOR VISIT:  Follow-up for stage I HER2 positive breast cancer  HISTORY OF PRESENTING ILLNESS:    Amber Barnes is a  71 y.o.  female presents for follow up of right breast cancer.  Oncology History  Malignant neoplasm of upper-outer quadrant of right female breast (Amber Barnes)  01/19/2021 Initial Diagnosis   Breast cancer in female  -12/30/2020, patient had a screening mammogram done which showed possible mass and asymmetry in the right breast. 01/08/2021, diagnostic mammogram of the right breast showed 2 adjacent irregular spiculated masses.  Larger 1 was 10 mm and a smaller mass measured 5 mm. 01/15/2021 stereotactic biopsy of the right breast smaller mass showed invasive carcinoma no special type, grade 2, DCIS present, LVI negative.Ultrasound guided biopsy of the larger mass showed invasive mammary carcinoma  02/17/2021, patient is status post lumpectomy and sentinel lymph node biopsy Pathology showed invasive mammary carcinoma, 2 separate foci, DCIS, negative sentinel lymph node biopsy 0/1, grade 3, or margins negative for invasive carcinoma.  ER -, PR + [1-10%], HER2 +  pT1b(m) pN0   Strong family history of cancer, Discussed again about genetic testing. patient adamantly declined.   03/03/2021 Cancer Staging   Staging form: Breast, AJCC 8th Edition - Pathologic stage from 03/03/2021: Stage IA (pT1b, pN0, cM0, G3, ER-, PR+, HER2+) - Signed by Amber Server, MD on 03/03/2021 Stage prefix: Initial diagnosis Multigene prognostic tests performed: None Histologic grading system: 3 grade system   03/08/2021 Echocardiogram   echocardiogram showed LVEF 60-65%. Per patient, she has been seen by Amber Barnes team and was cleared for proceeding with chemotherapy.    02/2021 Genetic Testing   Strong family history of cancer, Discussed again about genetic testing. patient adamantly declined.   03/23/2021 Procedure   Mediport by  vascular surgeon   03/24/2021 -  Chemotherapy   03/24/2021 - 03/31/2021 Taxol 80 mg/m2 Amber Barnes 04/05/2021 - 04/09/2021 patient was admitted due to neutropenic fever with  associated SIRS.  Patient was treated with empiric cefepime and vancomycin until date of discharge when [place] recovered to 1.4. Post hospitalization, patient also had COVID-19 infection so her follow-up appointment was further postponed for another 2 weeks.  05/10/2021 resumed dose reduce Taxol 61m/m2 /Amber Critchley11/15/2022-06/30/2021, cycle 2 dose reduced Taxol 644mm2 /trastuzumab   admitted from 07/12/2021 - 07/17/2021 due to acute pyelonephritis UVJ obstruction and a right hydro ureteralnephrosis.  Patient was treated with IV antibiotics status post emergent bilateral ureteral stent placement.  Discharged home with a course of oral antibiotics.  Shared decision made to hold chemotherapy and proceed with radiation.    08/18/2021 - 09/08/2021 Radiation Therapy    adjuvant breast radiation   10/06/2021 -  Chemotherapy   10/06/2021, shared decision was made to proceed with maintenance trastuzumab.  Her cardiologist Amber Barnes clearance to proceed. Plan Transtuzumab for up to a year if tolerates.    12/14/2021 Mammogram   Bilateral diagnostic mammogram 1. Probably benign right breast calcifications along the posterior lumpectomy bed felt to represent early changes of fat necrosis.Recommend short-term imaging follow-up. 2. No mammographic evidence of malignancy on the left.   03/02/2022 Echocardiogram   LVEF 6074-16%grade I diastolic dysfuction   HELAG5-XMIWOEHOarcinoma of breast (HCC)    Left butt/thigh tenderness for a few weeks, ultrasound soft tissue left lower extremity negative.   INTERVAL HISTORY Amber Stickles a [age].o. female who has above history reviewed by me today presents for follow up visit for management of right side HER 2 positive breast cancer She feels well today. No new complaints.    Review of Systems  Constitutional:  Positive for fatigue. Negative for appetite change, chills, fever and unexpected weight change.  HENT:   Negative for hearing loss and voice  change.   Respiratory:  Negative for chest tightness and cough.   Cardiovascular:  Negative for chest pain and leg swelling.  Gastrointestinal:  Negative for abdominal distention, abdominal pain, blood in stool, nausea and vomiting.  Endocrine: Negative for hot flashes.  Genitourinary:  Negative for difficulty urinating, frequency and hematuria.   Musculoskeletal:  Positive for arthralgias.  Skin:  Negative for itching and rash.  Neurological:  Positive for numbness. Negative for dizziness and extremity weakness.  Hematological:  Negative for adenopathy.  Psychiatric/Behavioral:  Negative for confusion.     MEDICAL HISTORY:  Past Medical History:  Diagnosis Date   Achilles tendinitis    Acute medial meniscal tear    Allergy    Anxiety    Atrial fibrillation ([facility]   a. new onset 07/2016; b. CHADS2VASc --> 4 (HTN, female, age, CHF); c. eliquis   CHF (congestive heart failure) (HCC)    Chronic anticoagulation    Apixaban   Chronic lower back pain    Colon polyp    Complication of anesthesia    Emergence delirium; hypoxia   COPD (chronic obstructive pulmonary disease) ([facility]   Cystic breast    Depression    Endometriosis    Fibrocystic breast disease    GERD (gastroesophageal reflux disease)    History of stress test    a. 07/2016 MV: EF 55-65%. Small, mild defect  in mid anteroseptal, apical septal, and apical locations. No ischemia-->low risk.   Hyperlipidemia    Hypertension    Insomnia  Lipoma    Malignant neoplasm of upper-outer quadrant of right female breast ([place])    Stage 1A invasive mammary carcinoma (cT1b or T1c N0); ER (-), PR (+), HER2/neu (+)   Migraine    Mild Mitral regurgitation    a. 07/2016 Echo: EF 55-65%, no rwma, mild MR. Mildly dil LA. Nl RV fx.    Nephrolithiasis    OSA (obstructive sleep apnea)    non-compliant with nocturnal PAP therapy   Osteoarthritis    left knee   Osteopenia    Peptic ulcer disease    a. H. pylori   Pneumonia    RLS  (restless legs syndrome)    Tendonitis    Thyroid disease    Vitamin D deficiency     SURGICAL HISTORY: Past Surgical History:  Procedure Laterality Date   ABDOMINAL HYSTERECTOMY     ovaries left   ACHILLES TENDON SURGERY     2012,01/2017   BACK SURGERY     fusion lumbar   BREAST BIOPSY Right 01/15/2021   Affirm biopsy-"X" clip-INVASIVE MAMMARY CARCINOMA   BREAST BIOPSY Right 01/15/2021   u/s bx-"venus" clip INVASIVE MAMMARY CARCINOMA   BREAST BIOPSY     BREAST LUMPECTOMY,RADIO FREQ LOCALIZER,AXILLARY SENTINEL LYMPH NODE BIOPSY Right 02/17/2021   Procedure: BREAST LUMPECTOMY,RADIO FREQ LOCALIZER,AXILLARY SENTINEL LYMPH NODE BIOPSY;  Surgeon: Ronny Bacon, MD;  Location: [facility];  Service: General;  Laterality: Right;   CARDIOVERSION N/A 09/07/2016   Procedure: CARDIOVERSION;  Surgeon: Minna Merritts, MD;  Location: [facility];  Service: Cardiovascular;  Laterality: N/A;   CARDIOVERSION N/A 10/19/2016   Procedure: CARDIOVERSION;  Surgeon: Minna Merritts, MD;  Location: [facility];  Service: Cardiovascular;  Laterality: N/A;   CARDIOVERSION N/A 12/14/2016   Procedure: CARDIOVERSION;  Surgeon: Minna Merritts, MD;  Location: [facility];  Service: Cardiovascular;  Laterality: N/A;   COLONOSCOPY     COLONOSCOPY WITH PROPOFOL N/A 05/13/2020   Procedure: COLONOSCOPY WITH PROPOFOL;  Surgeon: Toledo, Benay Pike, MD;  Location: [facility];  Service: Gastroenterology;  Laterality: N/A;  ELIQUIS   CYSTOSCOPY WITH STENT PLACEMENT Bilateral 07/12/2021   Procedure: CYSTOSCOPY WITH STENT PLACEMENT;  Surgeon: Billey Co, MD;  Location: [facility];  Service: Urology;  Laterality: Bilateral;   CYSTOSCOPY/URETEROSCOPY/HOLMIUM LASER/STENT PLACEMENT Bilateral 07/30/2021   Procedure: CYSTOSCOPY/URETEROSCOPY/HOLMIUM LASER/BILATERAL STENT REMOVAL;  Surgeon: Billey Co, MD;  Location: [facility];  Service: Urology;  Laterality: Bilateral;   ESOPHAGOGASTRODUODENOSCOPY      ESOPHAGOGASTRODUODENOSCOPY (EGD) WITH PROPOFOL N/A 05/13/2020   Procedure: ESOPHAGOGASTRODUODENOSCOPY (EGD) WITH PROPOFOL;  Surgeon: Toledo, Benay Pike, MD;  Location: [facility];  Service: Gastroenterology;  Laterality: N/A;   FOOT SURGERY Left    tendon   JOINT REPLACEMENT Right    hip   PORTA CATH INSERTION N/A 03/23/2021   Procedure: PORTA CATH INSERTION;  Surgeon: Katha Cabal, MD;  Location: [facility];  Service: Cardiovascular;  Laterality: N/A;   PORTACATH PLACEMENT N/A 03/23/2021   Procedure: ATTEMPTED INSERTION PORT-A-CATH;  Surgeon: Ronny Bacon, MD;  Location: [facility];  Service: General;  Laterality: N/A;   THYROIDECTOMY     thyroid lobectomy secondary to a benign nodule   TOTAL HIP ARTHROPLASTY Right 08/21/2018   Procedure: TOTAL HIP ARTHROPLASTY ANTERIOR APPROACH-RIGHT CELL SAVER REQUESTED;  Surgeon: [name], MD;  Location: [facility];  Service: Orthopedics;  Laterality: Right;    SOCIAL HISTORY: Social History   Socioeconomic History   Marital status: Married    Spouse name: jeffrey   Number of children:  1   Years of education: Not on file   Highest education level: High school graduate  Occupational History    Comment: disabled  Tobacco Use   Smoking status: Former    Packs/day: 0.25    Years: 10.00    Total pack years: 2.50    Types: Cigarettes    Quit date: 2005    Years since quitting: 18.8   Smokeless tobacco: Never   Tobacco comments:    smoked for 10 years, 1 pack every 2-3 days  Vaping Use   Vaping Use: Never used  Substance and Sexual Activity   Alcohol use: Not Currently    Alcohol/week: 1.0 standard drink of alcohol    Types: 1 Glasses of wine per week    Comment: rarely   Drug use: No   Sexual activity: Not Currently  Other Topics Concern   Not on file  Social History Narrative   Moved to [place] from [place], originally from [place].    1 cup of caffeine daily   Right handed   Lives with  husband, step-daughter and child       Social Determinants of Barnes   Financial Resource Strain: Low Risk  (12/09/2021)   Overall Financial Resource Strain (CARDIA)    Difficulty of Paying Living Expenses: Not very hard  Food Insecurity: No Food Insecurity (12/09/2021)   Hunger Vital Sign    Worried About Running Out of Food in the Last Year: Never true    Ran Out of Food in the Last Year: Never true  Transportation Needs: No Transportation Needs (12/09/2021)   PRAPARE - Hydrologist (Medical): No    Lack of Transportation (Non-Medical): No  Physical Activity: Insufficiently Active (12/09/2021)   Exercise Vital Sign    Days of Exercise per Week: 7 days    Minutes of Exercise per Session: 20 min  Stress: Stress Concern Present (12/09/2021)   [place]    Feeling of Stress : Very much  Social Connections: Moderately Isolated (12/09/2021)   Social Connection and Isolation Panel [NHANES]    Frequency of Communication with Friends and Family: Never    Frequency of Social Gatherings with Friends and Family: Once a week    Attends Religious Services: More than 4 times per year    Active Member of [employer] or Organizations: No    Attends Archivist Meetings: Never    Marital Status: Married  Human resources officer Violence: Not At Risk (12/09/2021)   Humiliation, Afraid, Rape, and Kick questionnaire    Fear of Current or Ex-Partner: No    Emotionally Abused: No    Physically Abused: No    Sexually Abused: No    FAMILY HISTORY: Family History  Problem Relation Age of Onset   Cancer Mother        leukemia   Cancer Father 48       colon   Heart disease Father    Heart attack Father 54       died from MI   Hyperlipidemia Father    Hypertension Father    Cancer Sister 38       ovarian   Depression Sister    Cancer Brother        CLL   Breast cancer Paternal Aunt        17's   Migraines Neg  Hx    Thyroid cancer Neg Hx  ALLERGIES:  is allergic to prednisone, amiodarone, hydrocodone-acetaminophen, oxycodone, tapentadol, tramadol, adhesive [tape], and latex.  MEDICATIONS:  Current Outpatient Medications  Medication Sig Dispense Refill   acetaminophen (TYLENOL) 500 MG tablet Take 500-1,000 mg by mouth every 6 (six) hours as needed for mild pain or moderate pain.     ALPRAZolam (XANAX) 0.25 MG tablet TAKE 1 TABLET BY MOUTH 2 TIMES DAILY AS NEEDED FOR ANXIETY. 60 tablet 2   apixaban (ELIQUIS) 5 MG TABS tablet TAKE 1 TABLET BY MOUTH TWICE A DAY 180 tablet 1   bisoprolol (ZEBETA) 10 MG tablet Take 1 tablet (10 mg total) by mouth 2 (two) times daily. 120 tablet 3   busPIRone (BUSPAR) 5 MG tablet TAKE 1 TABLET BY MOUTH 3 TIMES DAILY AS NEEDED. 60 tablet 1   Cholecalciferol (VITAMIN D3) 5000 units CAPS Take 5,000 Units by mouth daily.     clotrimazole (LOTRIMIN) 1 % cream Apply 1 application topically 2 (two) times daily. 30 g 0   diltiazem (CARDIZEM CD) 240 MG 24 hr capsule Take 240 mg by mouth as needed.     diphenhydrAMINE (BENADRYL) 25 mg capsule Take 1 capsule (25 mg total) by mouth every 8 (eight) hours as needed for itching or allergies. 30 capsule 0   esomeprazole (NEXIUM) 20 MG capsule Take 20 mg by mouth daily as needed (GERD symptoms).     flecainide (TAMBOCOR) 100 MG tablet Take 1 tablet (100 mg total) by mouth 2 (two) times daily. 180 tablet 3   FLUoxetine (PROZAC) 20 MG capsule Take 1 capsule (20 mg total) by mouth every morning. 90 capsule 3   furosemide (LASIX) 20 MG tablet TAKE 1 TABLET BY MOUTH TWICE A DAY 180 tablet 0   gabapentin (NEURONTIN) 100 MG capsule TAKE 1 CAPSULE (100 MG TOTAL) BY MOUTH THREE TIMES DAILY. 270 capsule 0   KLOR-CON M20 20 MEQ tablet TAKE 1 TABLET BY MOUTH EVERY DAY 90 tablet 1   nystatin (MYCOSTATIN/NYSTOP) powder Apply 1 application. topically 3 (three) times daily. 60 g 11   nystatin cream (MYCOSTATIN) Apply 1 application. topically 2 (two)  times daily. Prn abdomen 60 g 5   ondansetron (ZOFRAN) 8 MG tablet TAKE 1 TABLET BY MOUTH EVERY 8 HOURS AS NEEDED FOR NAUSEA OR VOMITING. 60 tablet 0   promethazine (PHENERGAN) 25 MG tablet Take 1 tablet (25 mg total) by mouth every 6 (six) hours as needed for nausea or vomiting. 60 tablet 0   ranolazine (RANEXA) 500 MG 12 hr tablet TAKE 1 TABLET BY MOUTH TWICE A DAY 180 tablet 1   rizatriptan (MAXALT) 10 MG tablet TAKE 1 TABLET (10 MG TOTAL) BY MOUTH AS NEEDED FOR MIGRAINE. MAY REPEAT IN 2 HOURS IF NEEDED 10 tablet 0   rOPINIRole (REQUIP) 2 MG tablet TAKE 1 TABLET BY MOUTH EVERYDAY AT BEDTIME 90 tablet 1   rosuvastatin (CRESTOR) 40 MG tablet Take 1 tablet (40 mg total) by mouth daily. 90 tablet 3   meclizine (ANTIVERT) 12.5 MG tablet Take 1 tablet (12.5 mg total) by mouth 3 (three) times daily as needed for dizziness. (Patient not taking: Reported on 05/09/2022) 30 tablet 0   Multiple Vitamin (MULTIVITAMIN) tablet Take 1 tablet by mouth daily.     No current facility-administered medications for this visit.     PHYSICAL EXAMINATION: ECOG PERFORMANCE STATUS: 1 - Symptomatic but completely ambulatory Vitals:   05/25/22 1307  BP: (!) 161/84  Pulse: (!) 56  Temp: (!) 96.9 F (36.1 C)  SpO2: 100%  Filed Weights   05/25/22 1307  Weight: 258 lb (117 kg)     Physical Exam Constitutional:      General: She is not in acute distress.    Appearance: She is obese.  HENT:     Head: Normocephalic and atraumatic.  Eyes:     General: No scleral icterus. Cardiovascular:     Rate and Rhythm: Normal rate. Rhythm irregular.     Heart sounds: Normal heart sounds.  Pulmonary:     Effort: Pulmonary effort is normal. No respiratory distress.     Breath sounds: No wheezing.  Abdominal:     General: Bowel sounds are normal. There is no distension.     Palpations: Abdomen is soft.  Musculoskeletal:        General: No deformity. Normal range of motion.     Cervical back: Normal range of  motion and neck supple.     Comments: 1+ edema bilaterally in lower extremity Small left scapular soft tissue mass,   Skin:    General: Skin is warm and dry.     Findings: No erythema or rash.  Neurological:     Mental Status: She is alert and oriented to person, place, and time. Mental status is at baseline.     Cranial Nerves: No cranial nerve deficit.     Coordination: Coordination normal.  Psychiatric:        Mood and Affect: Mood normal.     . LABORATORY DATA:  I have reviewed the data as listed    Latest Ref Rng & Units 05/25/2022   12:56 PM 05/04/2022    1:28 PM 04/13/2022   10:13 AM  CBC  WBC 4.0 - 10.5 K/uL 6.3  5.6  4.7   Hemoglobin 12.0 - 15.0 g/dL 13.3  14.0  12.7   Hematocrit 36.0 - 46.0 % 40.2  41.9  37.7   Platelets 150 - 400 K/uL 179  177  152       Latest Ref Rng & Units 05/25/2022   12:56 PM 05/04/2022    1:28 PM 04/13/2022   10:13 AM  CMP  Glucose 70 - 99 mg/dL 92  105  88   BUN 8 - 23 mg/dL _0 Creatinine 0.44 - 1.00 mg/dL 0.93  0.91  0.83   Sodium 135 - 145 mmol/L 138  140  138   Potassium 3.5 - 5.1 mmol/L 3.8  3.9  3.9   Chloride 98 - 111 mmol/L 102  105  105   CO2 22 - 32 mmol/L _1 Calcium 8.9 - 10.3 mg/dL 9.1  8.6  8.6   Total Protein 6.5 - 8.1 g/dL 7.4  7.1  7.1   Total Bilirubin 0.3 - 1.2 mg/dL 0.6  0.5  0.8   Alkaline Phos 38 - 126 U/L 77  74  76   AST 15 - 41 U/L _2 ALT 0 - 44 U/L _3 RADIOGRAPHIC STUDIES: I have personally reviewed the radiological images as listed and agreed with the findings in the report. No results found.

## 2022-05-25 NOTE — Assessment & Plan Note (Signed)
#  Right breast invasive carcinoma pT1b pN0 ER negative, PR weakly positive HER 2 positive.  Patient does not tolerate adjuvant chemotherapy with Taxol and trastuzumab despite Taxol dose reduction.-->Status post adjuvant radiation--> maintenance Trastuzumab Labs are reviewed and discussed with patient. Proceed with trastuzumab maintenance. August 2023 Echo showed stable LVEF Obtain right diagnostic mammogram in December 2023 Repeat echocardiogram in November 2023.

## 2022-05-25 NOTE — Telephone Encounter (Signed)
Call pt  Received a fax for a knee support orthosis  I dont believe we have discussed or taken an image of knee recently  Please sch appt so we can discuss and evaluate knee more thoroughly

## 2022-05-25 NOTE — Assessment & Plan Note (Signed)
Treatment plan as needed.

## 2022-05-25 NOTE — Assessment & Plan Note (Signed)
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  She may increase dose of 200 mg at bedtime.

## 2022-05-25 NOTE — Assessment & Plan Note (Signed)
Follow-up with cardiology.  Patient is on Eliquis 5 mg twice daily. Recently started on Flecainide.

## 2022-05-25 NOTE — Patient Instructions (Signed)
[facility] AT [place]  Discharge Instructions: Thank you for choosing [facility] to provide your oncology and hematology care.  If you have a lab appointment with the [facility], please go directly to the [facility] and check in at the registration area.  Wear comfortable clothing and clothing appropriate for easy access to any Portacath or PICC line.   We strive to give you quality time with your provider. You may need to reschedule your appointment if you arrive late (15 or more minutes).  Arriving late affects you and other patients whose appointments are after yours.  Also, if you miss three or more appointments without notifying the office, you may be dismissed from the clinic at the provider's discretion.      For prescription refill requests, have your pharmacy contact our office and allow 72 hours for refills to be completed.    Today you received the following chemotherapy and/or immunotherapy agents KANJINTI      To help prevent nausea and vomiting after your treatment, we encourage you to take your nausea medication as directed.  BELOW ARE SYMPTOMS THAT SHOULD BE REPORTED IMMEDIATELY: *FEVER GREATER THAN 100.4 F (38 C) OR HIGHER *CHILLS OR SWEATING *NAUSEA AND VOMITING THAT IS NOT CONTROLLED WITH YOUR NAUSEA MEDICATION *UNUSUAL SHORTNESS OF BREATH *UNUSUAL BRUISING OR BLEEDING *URINARY PROBLEMS (pain or burning when urinating, or frequent urination) *BOWEL PROBLEMS (unusual diarrhea, constipation, pain near the anus) TENDERNESS IN MOUTH AND THROAT WITH OR WITHOUT PRESENCE OF ULCERS (sore throat, sores in mouth, or a toothache) UNUSUAL RASH, SWELLING OR PAIN  UNUSUAL VAGINAL DISCHARGE OR ITCHING   Items with * indicate a potential emergency and should be followed up as soon as possible or go to the Emergency Department if any problems should occur.  Please show the CHEMOTHERAPY ALERT CARD or IMMUNOTHERAPY ALERT CARD at check-in to  the Emergency Department and triage nurse.  Should you have questions after your visit or need to cancel or reschedule your appointment, please contact [facility] [place] AT [place]  [phone] and follow the prompts.  Office hours are 8:00 a.m. to 4:30 p.m. Monday - Friday. Please note that voicemails left after 4:00 p.m. may not be returned until the following business day.  We are closed weekends and major holidays. You have access to a nurse at all times for urgent questions. Please call the main number to the clinic [phone] and follow the prompts.  For any non-urgent questions, you may also contact your provider using MyChart. We now offer e-Visits for anyone 69 and older to request care online for non-urgent symptoms. For details visit [email or web address].   Also download the MyChart app! Go to the app store, search "MyChart", open the app, select [facility], and log in with your MyChart username and password.  Masks are optional in the cancer centers. If you would like for your care team to wear a mask while they are taking care of you, please let them know. For doctor visits, patients may have with them one support person who is at least 69 years old. At this time, visitors are not allowed in the infusion area.  Trastuzumab Injection What is this medication? TRASTUZUMAB (tras TOO zoo mab) treats breast cancer and stomach cancer. It works by blocking a protein that causes cancer cells to grow and multiply. This helps to slow or stop the spread of cancer cells. This medicine may be used for other purposes; ask your health care provider or  pharmacist if you have questions. COMMON BRAND NAME(S): Herceptin, Janae Bridgeman, Ontruzant, Trazimera What should I tell my care team before I take this medication? They need to know if you have any of these conditions: Heart failure Lung disease An unusual or allergic reaction to trastuzumab, other medications,  foods, dyes, or preservatives Pregnant or trying to get pregnant Breast-feeding How should I use this medication? This medication is injected into a vein. It is given by your care team in a hospital or clinic setting. Talk to your care team about the use of this medication in children. It is not approved for use in children. Overdosage: If you think you have taken too much of this medicine contact a poison control center or emergency room at once. NOTE: This medicine is only for you. Do not share this medicine with others. What if I miss a dose? Keep appointments for follow-up doses. It is important not to miss your dose. Call your care team if you are unable to keep an appointment. What may interact with this medication? Certain types of chemotherapy, such as daunorubicin, doxorubicin, epirubicin, idarubicin This list may not describe all possible interactions. Give your health care provider a list of all the medicines, herbs, non-prescription drugs, or dietary supplements you use. Also tell them if you smoke, drink alcohol, or use illegal drugs. Some items may interact with your medicine. What should I watch for while using this medication? Your condition will be monitored carefully while you are receiving this medication. This medication may make you feel generally unwell. This is not uncommon, as chemotherapy affects healthy cells as well as cancer cells. Report any side effects. Continue your course of treatment even though you feel ill unless your care team tells you to stop. This medication may increase your risk of getting an infection. Call your care team for advice if you get a fever, chills, sore throat, or other symptoms of a cold or flu. Do not treat yourself. Try to avoid being around people who are sick. Avoid taking medications that contain aspirin, acetaminophen, ibuprofen, naproxen, or ketoprofen unless instructed by your care team. These medications can hide a fever. Talk to your  care team if you may be pregnant. Serious birth defects can occur if you take this medication during pregnancy and for 7 months after the last dose. You will need a negative pregnancy test before starting this medication. Contraception is recommended while taking this medication and for 7 months after the last dose. Your care team can help you find the option that works for you. Do not breastfeed while taking this medication and for 7 months after stopping treatment. What side effects may I notice from receiving this medication? Side effects that you should report to your care team as soon as possible: Allergic reactions or angioedema--skin rash, itching or hives, swelling of the face, eyes, lips, tongue, arms, or legs, trouble swallowing or breathing Dry cough, shortness of breath or trouble breathing Heart failure--shortness of breath, swelling of the ankles, feet, or hands, sudden weight gain, unusual weakness or fatigue Infection--fever, chills, cough, or sore throat Infusion reactions--chest pain, shortness of breath or trouble breathing, feeling faint or lightheaded Side effects that usually do not require medical attention (report to your care team if they continue or are bothersome): Diarrhea Dizziness Headache Nausea Trouble sleeping Vomiting This list may not describe all possible side effects. Call your doctor for medical advice about side effects. You may report side effects to FDA at 1-800-FDA-1088.  Where should I keep my medication? This medication is given in a hospital or clinic. It will not be stored at home. NOTE: This sheet is a summary. It may not cover all possible information. If you have questions about this medicine, talk to your doctor, pharmacist, or health care provider.  2023 Elsevier/Gold Standard (2021-10-28 00:00:00)

## 2022-05-26 ENCOUNTER — Other Ambulatory Visit: Payer: Self-pay

## 2022-05-31 ENCOUNTER — Ambulatory Visit
Admission: RE | Admit: 2022-05-31 | Discharge: 2022-05-31 | Disposition: A | Discharge status: Home / Self Care | Payer: Medicare Other | Source: Ambulatory Visit | Attending: Oncology | Admitting: Oncology

## 2022-05-31 DIAGNOSIS — C50411 Malignant neoplasm of upper-outer quadrant of right female breast: Secondary | ICD-10-CM | POA: Diagnosis not present

## 2022-05-31 DIAGNOSIS — Z171 Estrogen receptor negative status [ER-]: Secondary | ICD-10-CM | POA: Insufficient documentation

## 2022-05-31 DIAGNOSIS — J449 Chronic obstructive pulmonary disease, unspecified: Secondary | ICD-10-CM | POA: Insufficient documentation

## 2022-05-31 DIAGNOSIS — I509 Heart failure, unspecified: Secondary | ICD-10-CM | POA: Diagnosis not present

## 2022-05-31 DIAGNOSIS — Z5181 Encounter for therapeutic drug level monitoring: Secondary | ICD-10-CM | POA: Diagnosis not present

## 2022-05-31 DIAGNOSIS — I110 Hypertensive heart disease with heart failure: Secondary | ICD-10-CM | POA: Diagnosis not present

## 2022-05-31 DIAGNOSIS — Z79899 Other long term (current) drug therapy: Secondary | ICD-10-CM | POA: Insufficient documentation

## 2022-05-31 DIAGNOSIS — Z0189 Encounter for other specified special examinations: Secondary | ICD-10-CM | POA: Diagnosis not present

## 2022-05-31 LAB — ECHOCARDIOGRAM COMPLETE
AR max vel: 2.04 cm2
AV Area VTI: 2.24 cm2
AV Area mean vel: 2.19 cm2
AV Mean grad: 5 mmHg
AV Peak grad: 9.7 mmHg
Ao pk vel: 1.56 m/s
Area-P 1/2: 3.53 cm2
Calc EF: 58.4 %
S' Lateral: 3 cm
Single Plane A2C EF: 50.1 %
Single Plane A4C EF: 66.4 %

## 2022-05-31 NOTE — Progress Notes (Signed)
*  PRELIMINARY RESULTS* Echocardiogram 2D Echocardiogram has been performed.  Sherrie Sport 05/31/2022, 11:00 AM

## 2022-06-09 ENCOUNTER — Ambulatory Visit: Payer: Medicare Other

## 2022-06-14 ENCOUNTER — Ambulatory Visit: Payer: Medicare Other | Attending: Internal Medicine

## 2022-06-14 DIAGNOSIS — Z5181 Encounter for therapeutic drug level monitoring: Secondary | ICD-10-CM

## 2022-06-14 DIAGNOSIS — Z79899 Other long term (current) drug therapy: Secondary | ICD-10-CM | POA: Diagnosis not present

## 2022-06-14 LAB — EXERCISE TOLERANCE TEST
Angina Index: 0
Duke Treadmill Score: 3
Estimated workload: 2
Exercise duration (min): 3 min
Exercise duration (sec): 0 s
MPHR: 151 {beats}/min
Peak HR: 74 {beats}/min
Percent HR: 49 %
Rest HR: 52 {beats}/min
ST Depression (mm): 0 mm

## 2022-06-15 ENCOUNTER — Inpatient Hospital Stay: Payer: Medicare Other

## 2022-06-15 ENCOUNTER — Encounter: Payer: Self-pay | Admitting: Oncology

## 2022-06-15 ENCOUNTER — Inpatient Hospital Stay: Payer: Medicare Other | Attending: Oncology | Admitting: Oncology

## 2022-06-15 VITALS — BP 161/80 | HR 60 | Temp 96.2°F | Wt 260.5 lb

## 2022-06-15 DIAGNOSIS — Z806 Family history of leukemia: Secondary | ICD-10-CM | POA: Insufficient documentation

## 2022-06-15 DIAGNOSIS — M8580 Other specified disorders of bone density and structure, unspecified site: Secondary | ICD-10-CM | POA: Diagnosis not present

## 2022-06-15 DIAGNOSIS — E785 Hyperlipidemia, unspecified: Secondary | ICD-10-CM | POA: Diagnosis not present

## 2022-06-15 DIAGNOSIS — Z87891 Personal history of nicotine dependence: Secondary | ICD-10-CM | POA: Insufficient documentation

## 2022-06-15 DIAGNOSIS — Z9221 Personal history of antineoplastic chemotherapy: Secondary | ICD-10-CM | POA: Diagnosis not present

## 2022-06-15 DIAGNOSIS — I10 Essential (primary) hypertension: Secondary | ICD-10-CM | POA: Insufficient documentation

## 2022-06-15 DIAGNOSIS — Z8711 Personal history of peptic ulcer disease: Secondary | ICD-10-CM | POA: Diagnosis not present

## 2022-06-15 DIAGNOSIS — Z7901 Long term (current) use of anticoagulants: Secondary | ICD-10-CM | POA: Insufficient documentation

## 2022-06-15 DIAGNOSIS — C50411 Malignant neoplasm of upper-outer quadrant of right female breast: Secondary | ICD-10-CM

## 2022-06-15 DIAGNOSIS — Z5112 Encounter for antineoplastic immunotherapy: Secondary | ICD-10-CM | POA: Diagnosis not present

## 2022-06-15 DIAGNOSIS — K219 Gastro-esophageal reflux disease without esophagitis: Secondary | ICD-10-CM | POA: Insufficient documentation

## 2022-06-15 DIAGNOSIS — Z171 Estrogen receptor negative status [ER-]: Secondary | ICD-10-CM | POA: Diagnosis not present

## 2022-06-15 DIAGNOSIS — Z803 Family history of malignant neoplasm of breast: Secondary | ICD-10-CM | POA: Diagnosis not present

## 2022-06-15 DIAGNOSIS — C50919 Malignant neoplasm of unspecified site of unspecified female breast: Secondary | ICD-10-CM | POA: Diagnosis not present

## 2022-06-15 DIAGNOSIS — I509 Heart failure, unspecified: Secondary | ICD-10-CM | POA: Diagnosis not present

## 2022-06-15 DIAGNOSIS — Z79899 Other long term (current) drug therapy: Secondary | ICD-10-CM | POA: Diagnosis not present

## 2022-06-15 DIAGNOSIS — G629 Polyneuropathy, unspecified: Secondary | ICD-10-CM | POA: Diagnosis not present

## 2022-06-15 DIAGNOSIS — Z923 Personal history of irradiation: Secondary | ICD-10-CM | POA: Diagnosis not present

## 2022-06-15 DIAGNOSIS — E559 Vitamin D deficiency, unspecified: Secondary | ICD-10-CM | POA: Insufficient documentation

## 2022-06-15 DIAGNOSIS — I480 Paroxysmal atrial fibrillation: Secondary | ICD-10-CM

## 2022-06-15 DIAGNOSIS — I4891 Unspecified atrial fibrillation: Secondary | ICD-10-CM | POA: Diagnosis not present

## 2022-06-15 DIAGNOSIS — J449 Chronic obstructive pulmonary disease, unspecified: Secondary | ICD-10-CM | POA: Insufficient documentation

## 2022-06-15 DIAGNOSIS — Z8041 Family history of malignant neoplasm of ovary: Secondary | ICD-10-CM | POA: Diagnosis not present

## 2022-06-15 LAB — COMPREHENSIVE METABOLIC PANEL
ALT: 16 U/L (ref 0–44)
AST: 22 U/L (ref 15–41)
Albumin: 3.7 g/dL (ref 3.5–5.0)
Alkaline Phosphatase: 76 U/L (ref 38–126)
Anion gap: 8 (ref 5–15)
BUN: 19 mg/dL (ref 8–23)
CO2: 27 mmol/L (ref 22–32)
Calcium: 8.4 mg/dL — ABNORMAL LOW (ref 8.9–10.3)
Chloride: 104 mmol/L (ref 98–111)
Creatinine, Ser: 0.69 mg/dL (ref 0.44–1.00)
GFR, Estimated: >60 mL/min (ref >60)
Glucose, Bld: 87 mg/dL (ref 70–99)
Potassium: 3.9 mmol/L (ref 3.5–5.1)
Sodium: 139 mmol/L (ref 135–145)
Total Bilirubin: 0.7 mg/dL (ref 0.3–1.2)
Total Protein: 6.8 g/dL (ref 6.5–8.1)

## 2022-06-15 LAB — CBC WITH DIFFERENTIAL/PLATELET
Abs Immature Granulocytes: 0.01 10*3/uL (ref 0.00–0.07)
Basophils Absolute: 0 10*3/uL (ref 0.0–0.1)
Basophils Relative: 1 %
Eosinophils Absolute: 0.1 10*3/uL (ref 0.0–0.5)
Eosinophils Relative: 1 %
HCT: 39.6 % (ref 36.0–46.0)
Hemoglobin: 13.4 g/dL (ref 12.0–15.0)
Immature Granulocytes: 0 %
Lymphocytes Relative: 26 %
Lymphs Abs: 1.4 10*3/uL (ref 0.7–4.0)
MCH: 31.2 pg (ref 26.0–34.0)
MCHC: 33.8 g/dL (ref 30.0–36.0)
MCV: 92.1 fL (ref 80.0–100.0)
Monocytes Absolute: 0.4 10*3/uL (ref 0.1–1.0)
Monocytes Relative: 7 %
Neutro Abs: 3.6 10*3/uL (ref 1.7–7.7)
Neutrophils Relative %: 65 %
Platelets: 164 10*3/uL (ref 150–400)
RBC: 4.3 MIL/uL (ref 3.87–5.11)
RDW: 13.2 % (ref 11.5–15.5)
WBC: 5.6 10*3/uL (ref 4.0–10.5)
nRBC: 0 % (ref 0.0–0.2)

## 2022-06-15 MED ORDER — HEPARIN SOD (PORK) LOCK FLUSH 100 UNIT/ML IV SOLN
INTRAVENOUS | Status: AC
  Administered 2022-06-15: 500 [IU]
  Filled 2022-06-15: qty 5

## 2022-06-15 MED ORDER — ACETAMINOPHEN 325 MG PO TABS
650.0000 mg | ORAL_TABLET | Freq: Once | ORAL | Status: AC
  Administered 2022-06-15: 650 mg via ORAL
  Filled 2022-06-15: qty 2

## 2022-06-15 MED ORDER — TRASTUZUMAB-ANNS CHEMO 150 MG IV SOLR
6.0000 mg/kg | Freq: Once | INTRAVENOUS | Status: AC
  Administered 2022-06-15: 693 mg via INTRAVENOUS
  Filled 2022-06-15: qty 33

## 2022-06-15 MED ORDER — SODIUM CHLORIDE 0.9 % IV SOLN
Freq: Once | INTRAVENOUS | Status: AC
  Filled 2022-06-15: qty 250

## 2022-06-15 MED ORDER — HEPARIN SOD (PORK) LOCK FLUSH 100 UNIT/ML IV SOLN
500.0000 [IU] | Freq: Once | INTRAVENOUS | Status: AC | PRN
  Administered 2022-06-15: 500 [IU]
  Filled 2022-06-15: qty 5

## 2022-06-15 MED ORDER — DIPHENHYDRAMINE HCL 25 MG PO CAPS
50.0000 mg | ORAL_CAPSULE | Freq: Once | ORAL | Status: AC
  Administered 2022-06-15: 50 mg via ORAL
  Filled 2022-06-15: qty 2

## 2022-06-15 NOTE — Assessment & Plan Note (Signed)
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  She may increase dose of 200 mg at bedtime.

## 2022-06-15 NOTE — Assessment & Plan Note (Signed)
Continue calcium 1500-1800 mg daily

## 2022-06-15 NOTE — Progress Notes (Addendum)
Hematology/Oncology Progress note Telephone:(336) [phone] Fax:(336) [phone]      ASSESSMENT & PLAN:   Cancer Staging  Malignant neoplasm of upper-outer quadrant of right female breast [facility]) Staging form: Breast, AJCC 8th Edition - Pathologic stage from 03/03/2021: Stage IA (pT1b, pN0, cM0, G3, ER-, PR+, HER2+) - Signed by [name], MD on 03/03/2021   Malignant neoplasm of upper-outer quadrant of right female breast [facility]) #Right breast invasive carcinoma pT1b pN0 ER negative, PR weakly positive HER 2 positive.  Patient does not tolerate adjuvant chemotherapy with Taxol and trastuzumab despite Taxol dose reduction.-->Status post adjuvant radiation--> maintenance Trastuzumab Labs are reviewed and discussed with patient. Proceed with trastuzumab maintenance. Nov 2023 Echo showed mild decrease [5%] LVEF Obtain right diagnostic mammogram in December 2023  Atrial fibrillation [facility]) Patient is on Eliquis 5 mg twice daily. On Flecainide. Follow-up with cardiology.   Neuropathy Pre-existing neuropathy in her fingertips bilaterally- secondary to cervical spine radiculopathy continue 100 mg 3 times daily.  She may increase dose of 200 mg at bedtime.   Encounter for monoclonal antibody treatment for malignancy Treatment plan as needed.   Hypocalcemia Continue calcium 1500-1800 mg daily  Follow up  3 weeks lab MD trastuzumab.   All questions were answered. The patient knows to call the clinic with any problems, questions or concerns.  [name], MD, PhD [facility] Hematology Oncology 06/15/2022   CHIEF COMPLAINTS/REASON FOR VISIT:  Follow-up for stage I HER2 positive breast cancer  HISTORY OF PRESENTING ILLNESS:   Amber Barnes is a  71 y.o.  female presents for follow up of right breast cancer.  Oncology History  Malignant neoplasm of upper-outer quadrant of right female breast ([place])  01/19/2021 Initial Diagnosis   Breast cancer in female  -12/30/2020, patient had a  screening mammogram done which showed possible mass and asymmetry in the right breast. 01/08/2021, diagnostic mammogram of the right breast showed 2 adjacent irregular spiculated masses.  Larger 1 was 10 mm and a smaller mass measured 5 mm. 01/15/2021 stereotactic biopsy of the right breast smaller mass showed invasive carcinoma no special type, grade 2, DCIS present, LVI negative.Ultrasound guided biopsy of the larger mass showed invasive mammary carcinoma  02/17/2021, patient is status post lumpectomy and sentinel lymph node biopsy Pathology showed invasive mammary carcinoma, 2 separate foci, DCIS, negative sentinel lymph node biopsy 0/1, grade 3, or margins negative for invasive carcinoma.  ER -, PR + [1-10%], HER2 +  pT1b(m) pN0   Strong family history of cancer, Discussed again about genetic testing. patient adamantly declined.   03/03/2021 Cancer Staging   Staging form: Breast, AJCC 8th Edition - Pathologic stage from 03/03/2021: Stage IA (pT1b, pN0, cM0, G3, ER-, PR+, HER2+) - Signed by [name], MD on 03/03/2021 Stage prefix: Initial diagnosis Multigene prognostic tests performed: None Histologic grading system: 3 grade system   03/08/2021 Echocardiogram   echocardiogram showed LVEF 60-65%. Per patient, she has been seen by Dr. [name] team and was cleared for proceeding with chemotherapy.    02/2021 Genetic Testing   Strong family history of cancer, Discussed again about genetic testing. patient adamantly declined.   03/23/2021 Procedure   Mediport by vascular surgeon   03/24/2021 -  Chemotherapy   03/24/2021 - 03/31/2021 Taxol 80 mg/m2 [name] 04/05/2021 - 04/09/2021 patient was admitted due to neutropenic fever with associated SIRS.  Patient was treated with empiric cefepime and vancomycin until date of discharge when [place] recovered to 1.4. Post hospitalization, patient also had COVID-19 infection so her follow-up  appointment was further postponed for another 2 weeks.  05/10/2021  resumed dose reduce Taxol 69m/m2 /[name]11/15/2022-06/30/2021, cycle 2 dose reduced Taxol 653mm2 /trastuzumab   admitted from 07/12/2021 - 07/17/2021 due to acute pyelonephritis UVJ obstruction and a right hydro ureteralnephrosis.  Patient was treated with IV antibiotics status post emergent bilateral ureteral stent placement.  Discharged home with a course of oral antibiotics.  Shared decision made to hold chemotherapy and proceed with radiation.    08/18/2021 - 09/08/2021 Radiation Therapy    adjuvant breast radiation   10/06/2021 -  Chemotherapy   10/06/2021, shared decision was made to proceed with maintenance trastuzumab.  Her cardiologist Dr. GoRockey Situave clearance to proceed. Plan Transtuzumab for up to a year if tolerates.    12/14/2021 Mammogram   Bilateral diagnostic mammogram 1. Probably benign right breast calcifications along the posterior lumpectomy bed felt to represent early changes of fat necrosis.Recommend short-term imaging follow-up. 2. No mammographic evidence of malignancy on the left.   03/02/2022 Echocardiogram   LVEF 6072-09%grade I diastolic dysfuction   1147/09/6283chocardiogram   LVEF is 55 to 60%, Grade II diastolic dysfunction    [id number] of breast (HCC)    Left butt/thigh tenderness for a few weeks, ultrasound soft tissue left lower extremity negative.   INTERVAL HISTORY Amber Barnes a [age].o. female who has above history reviewed by me today presents for follow up visit for management of right side HER 2 positive breast cancer She feels well today. No new complaints.    Review of Systems  Constitutional:  Positive for fatigue. Negative for appetite change, chills, fever and unexpected weight change.  HENT:   Negative for hearing loss and voice change.   Respiratory:  Negative for chest tightness and cough.   Cardiovascular:  Negative for chest pain and leg swelling.  Gastrointestinal:  Negative for abdominal distention, abdominal  pain, blood in stool, nausea and vomiting.  Endocrine: Negative for hot flashes.  Genitourinary:  Negative for difficulty urinating, frequency and hematuria.   Musculoskeletal:  Positive for arthralgias.  Skin:  Negative for itching and rash.  Neurological:  Positive for numbness. Negative for dizziness and extremity weakness.  Hematological:  Negative for adenopathy.  Psychiatric/Behavioral:  Negative for confusion.     MEDICAL HISTORY:  Past Medical History:  Diagnosis Date   Achilles tendinitis    Acute medial meniscal tear    Allergy    Anxiety    Atrial fibrillation ([place]   a. new onset 07/2016; b. CHADS2VASc --> 4 (HTN, female, age, CHF); c. eliquis   CHF (congestive heart failure) (HCC)    Chronic anticoagulation    Apixaban   Chronic lower back pain    Colon polyp    Complication of anesthesia    Emergence delirium; hypoxia   COPD (chronic obstructive pulmonary disease) ([place]   Cystic breast    Depression    Endometriosis    Fibrocystic breast disease    GERD (gastroesophageal reflux disease)    History of stress test    a. 07/2016 MV: EF 55-65%. Small, mild defect  in mid anteroseptal, apical septal, and apical locations. No ischemia-->low risk.   Hyperlipidemia    Hypertension    Insomnia    Lipoma    Malignant neoplasm of upper-outer quadrant of right female breast (HCC)    Stage 1A invasive mammary carcinoma (cT1b or T1c N0); ER (-), PR (+), HER2/neu (+)   Migraine    Mild Mitral regurgitation  a. 07/2016 Echo: EF 55-65%, no rwma, mild MR. Mildly dil LA. Nl RV fx.    Nephrolithiasis    OSA (obstructive sleep apnea)    non-compliant with nocturnal PAP therapy   Osteoarthritis    left knee   Osteopenia    Peptic ulcer disease    a. H. pylori   Pneumonia    RLS (restless legs syndrome)    Tendonitis    Thyroid disease    Vitamin D deficiency     SURGICAL HISTORY: Past Surgical History:  Procedure Laterality Date   ABDOMINAL HYSTERECTOMY      ovaries left   ACHILLES TENDON SURGERY     2012,01/2017   BACK SURGERY     fusion lumbar   BREAST BIOPSY Right 01/15/2021   Affirm biopsy-"X" clip-INVASIVE MAMMARY CARCINOMA   BREAST BIOPSY Right 01/15/2021   u/s bx-"venus" clip INVASIVE MAMMARY CARCINOMA   BREAST BIOPSY     BREAST LUMPECTOMY,RADIO FREQ LOCALIZER,AXILLARY SENTINEL LYMPH NODE BIOPSY Right 02/17/2021   Procedure: BREAST LUMPECTOMY,RADIO FREQ LOCALIZER,AXILLARY SENTINEL LYMPH NODE BIOPSY;  Surgeon: Ronny Bacon, MD;  Location: [facility];  Service: General;  Laterality: Right;   CARDIOVERSION N/A 09/07/2016   Procedure: CARDIOVERSION;  Surgeon: Minna Merritts, MD;  Location: [facility];  Service: Cardiovascular;  Laterality: N/A;   CARDIOVERSION N/A 10/19/2016   Procedure: CARDIOVERSION;  Surgeon: Minna Merritts, MD;  Location: [facility];  Service: Cardiovascular;  Laterality: N/A;   CARDIOVERSION N/A 12/14/2016   Procedure: CARDIOVERSION;  Surgeon: Minna Merritts, MD;  Location: [facility];  Service: Cardiovascular;  Laterality: N/A;   COLONOSCOPY     COLONOSCOPY WITH PROPOFOL N/A 05/13/2020   Procedure: COLONOSCOPY WITH PROPOFOL;  Surgeon: Toledo, Benay Pike, MD;  Location: [facility];  Service: Gastroenterology;  Laterality: N/A;  ELIQUIS   CYSTOSCOPY WITH STENT PLACEMENT Bilateral 07/12/2021   Procedure: CYSTOSCOPY WITH STENT PLACEMENT;  Surgeon: Billey Co, MD;  Location: [facility];  Service: Urology;  Laterality: Bilateral;   CYSTOSCOPY/URETEROSCOPY/HOLMIUM LASER/STENT PLACEMENT Bilateral 07/30/2021   Procedure: CYSTOSCOPY/URETEROSCOPY/HOLMIUM LASER/BILATERAL STENT REMOVAL;  Surgeon: Billey Co, MD;  Location: [facility];  Service: Urology;  Laterality: Bilateral;   ESOPHAGOGASTRODUODENOSCOPY     ESOPHAGOGASTRODUODENOSCOPY (EGD) WITH PROPOFOL N/A 05/13/2020   Procedure: ESOPHAGOGASTRODUODENOSCOPY (EGD) WITH PROPOFOL;  Surgeon: Toledo, Benay Pike, MD;  Location: [facility];  Service: Gastroenterology;   Laterality: N/A;   FOOT SURGERY Left    tendon   JOINT REPLACEMENT Right    hip   PORTA CATH INSERTION N/A 03/23/2021   Procedure: PORTA CATH INSERTION;  Surgeon: Katha Cabal, MD;  Location: [place] [facility];  Service: Cardiovascular;  Laterality: N/A;   PORTACATH PLACEMENT N/A 03/23/2021   Procedure: ATTEMPTED INSERTION PORT-A-CATH;  Surgeon: Ronny Bacon, MD;  Location: [facility];  Service: General;  Laterality: N/A;   THYROIDECTOMY     thyroid lobectomy secondary to a benign nodule   TOTAL HIP ARTHROPLASTY Right 08/21/2018   Procedure: TOTAL HIP ARTHROPLASTY ANTERIOR APPROACH-RIGHT CELL SAVER REQUESTED;  Surgeon: [name], MD;  Location: [facility];  Service: Orthopedics;  Laterality: Right;    SOCIAL HISTORY: Social History   Socioeconomic History   Marital status: Married    Spouse name: jeffrey   Number of children: 1   Years of education: Not on file   Highest education level: High school graduate  Occupational History    Comment: disabled  Tobacco Use   Smoking status: Former    Packs/day: 0.25    Years: 10.00  Total pack years: 2.50    Types: Cigarettes    Quit date: 2005    Years since quitting: 18.9   Smokeless tobacco: Never   Tobacco comments:    smoked for 10 years, 1 pack every 2-3 days  Vaping Use   Vaping Use: Never used  Substance and Sexual Activity   Alcohol use: Not Currently    Alcohol/week: 1.0 standard drink of alcohol    Types: 1 Glasses of wine per week    Comment: rarely   Drug use: No   Sexual activity: Not Currently  Other Topics Concern   Not on file  Social History Narrative   Moved to [place] from [place], originally from [place].    1 cup of caffeine daily   Right handed   Lives with husband, step-daughter and child       Social Determinants of Health   Financial Resource Strain: Low Risk  (12/09/2021)   Overall Financial Resource Strain (CARDIA)    Difficulty of Paying Living Expenses:  Not very hard  Food Insecurity: No Food Insecurity (12/09/2021)   Hunger Vital Sign    Worried About Running Out of Food in the Last Year: Never true    Ran Out of Food in the Last Year: Never true  Transportation Needs: No Transportation Needs (12/09/2021)   PRAPARE - Hydrologist (Medical): No    Lack of Transportation (Non-Medical): No  Physical Activity: Insufficiently Active (12/09/2021)   Exercise Vital Sign    Days of Exercise per Week: 7 days    Minutes of Exercise per Session: 20 min  Stress: Stress Concern Present (12/09/2021)   [place]    Feeling of Stress : Very much  Social Connections: Moderately Isolated (12/09/2021)   Social Connection and Isolation Panel [NHANES]    Frequency of Communication with Friends and Family: Never    Frequency of Social Gatherings with Friends and Family: Once a week    Attends Religious Services: More than 4 times per year    Active Member of [employer] or Organizations: No    Attends Archivist Meetings: Never    Marital Status: Married  Human resources officer Violence: Not At Risk (12/09/2021)   Humiliation, Afraid, Rape, and Kick questionnaire    Fear of Current or Ex-Partner: No    Emotionally Abused: No    Physically Abused: No    Sexually Abused: No    FAMILY HISTORY: Family History  Problem Relation Age of Onset   Cancer Mother        leukemia   Cancer Father 43       colon   Heart disease Father    Heart attack Father 72       died from MI   Hyperlipidemia Father    Hypertension Father    Cancer Sister 75       ovarian   Depression Sister    Cancer Brother        CLL   Breast cancer Paternal Aunt        40's   Migraines Neg Hx    Thyroid cancer Neg Hx     ALLERGIES:  is allergic to prednisone, amiodarone, hydrocodone-acetaminophen, oxycodone, tapentadol, tramadol, adhesive [tape], and latex.  MEDICATIONS:  Current Outpatient  Medications  Medication Sig Dispense Refill   acetaminophen (TYLENOL) 500 MG tablet Take 500-1,000 mg by mouth every 6 (six) hours as needed for mild  pain or moderate pain.     ALPRAZolam (XANAX) 0.25 MG tablet TAKE 1 TABLET BY MOUTH 2 TIMES DAILY AS NEEDED FOR ANXIETY. 60 tablet 2   apixaban (ELIQUIS) 5 MG TABS tablet TAKE 1 TABLET BY MOUTH TWICE A DAY 180 tablet 1   bisoprolol (ZEBETA) 10 MG tablet Take 1 tablet (10 mg total) by mouth 2 (two) times daily. 120 tablet 3   busPIRone (BUSPAR) 5 MG tablet TAKE 1 TABLET BY MOUTH 3 TIMES DAILY AS NEEDED. 60 tablet 1   Cholecalciferol (VITAMIN D3) 5000 units CAPS Take 5,000 Units by mouth daily.     clotrimazole (LOTRIMIN) 1 % cream Apply 1 application topically 2 (two) times daily. 30 g 0   diltiazem (CARDIZEM CD) 240 MG 24 hr capsule Take 240 mg by mouth as needed.     diphenhydrAMINE (BENADRYL) 25 mg capsule Take 1 capsule (25 mg total) by mouth every 8 (eight) hours as needed for itching or allergies. 30 capsule 0   esomeprazole (NEXIUM) 20 MG capsule Take 20 mg by mouth daily as needed (GERD symptoms).     flecainide (TAMBOCOR) 100 MG tablet Take 1 tablet (100 mg total) by mouth 2 (two) times daily. 180 tablet 3   FLUoxetine (PROZAC) 20 MG capsule Take 1 capsule (20 mg total) by mouth every morning. 90 capsule 3   furosemide (LASIX) 20 MG tablet TAKE 1 TABLET BY MOUTH TWICE A DAY 180 tablet 0   gabapentin (NEURONTIN) 100 MG capsule TAKE 1 CAPSULE (100 MG TOTAL) BY MOUTH THREE TIMES DAILY. 270 capsule 0   KLOR-CON M20 20 MEQ tablet TAKE 1 TABLET BY MOUTH EVERY DAY 90 tablet 1   Multiple Vitamin (MULTIVITAMIN) tablet Take 1 tablet by mouth daily.     nystatin (MYCOSTATIN/NYSTOP) powder Apply 1 application. topically 3 (three) times daily. 60 g 11   nystatin cream (MYCOSTATIN) Apply 1 application. topically 2 (two) times daily. Prn abdomen 60 g 5   ondansetron (ZOFRAN) 8 MG tablet TAKE 1 TABLET BY MOUTH EVERY 8 HOURS AS NEEDED FOR NAUSEA OR VOMITING.  60 tablet 0   promethazine (PHENERGAN) 25 MG tablet Take 1 tablet (25 mg total) by mouth every 6 (six) hours as needed for nausea or vomiting. 60 tablet 0   ranolazine (RANEXA) 500 MG 12 hr tablet TAKE 1 TABLET BY MOUTH TWICE A DAY 180 tablet 1   rizatriptan (MAXALT) 10 MG tablet TAKE 1 TABLET (10 MG TOTAL) BY MOUTH AS NEEDED FOR MIGRAINE. MAY REPEAT IN 2 HOURS IF NEEDED 10 tablet 0   rOPINIRole (REQUIP) 2 MG tablet TAKE 1 TABLET BY MOUTH EVERYDAY AT BEDTIME 90 tablet 1   rosuvastatin (CRESTOR) 40 MG tablet Take 1 tablet (40 mg total) by mouth daily. 90 tablet 3   meclizine (ANTIVERT) 12.5 MG tablet Take 1 tablet (12.5 mg total) by mouth 3 (three) times daily as needed for dizziness. (Patient not taking: Reported on 05/09/2022) 30 tablet 0   No current facility-administered medications for this visit.     PHYSICAL EXAMINATION: ECOG PERFORMANCE STATUS: 1 - Symptomatic but completely ambulatory Vitals:   06/15/22 1401  BP: (!) 161/80  Pulse: 60  Temp: (!) 96.2 F (35.7 C)  SpO2: 98%    Filed Weights   06/15/22 1401  Weight: 260 lb 8 oz (118.2 kg)     Physical Exam Constitutional:      General: She is not in acute distress.    Appearance: She is obese.  HENT:  Head: Normocephalic and atraumatic.  Eyes:     General: No scleral icterus. Cardiovascular:     Rate and Rhythm: Normal rate. Rhythm irregular.     Heart sounds: Normal heart sounds.  Pulmonary:     Effort: Pulmonary effort is normal. No respiratory distress.     Breath sounds: No wheezing.  Abdominal:     General: Bowel sounds are normal. There is no distension.     Palpations: Abdomen is soft.  Musculoskeletal:        General: No deformity. Normal range of motion.     Cervical back: Normal range of motion and neck supple.     Comments: 1+ edema bilaterally in lower extremity Small left scapular soft tissue mass,   Skin:    General: Skin is warm and dry.     Findings: No erythema or rash.  Neurological:      Mental Status: She is alert and oriented to person, place, and time. Mental status is at baseline.     Cranial Nerves: No cranial nerve deficit.     Coordination: Coordination normal.  Psychiatric:        Mood and Affect: Mood normal.     . LABORATORY DATA:  I have reviewed the data as listed    Latest Ref Rng & Units 06/15/2022    1:47 PM 05/25/2022   12:56 PM 05/04/2022    1:28 PM  CBC  WBC 4.0 - 10.5 K/uL 5.6  6.3  5.6   Hemoglobin 12.0 - 15.0 g/dL 13.4  13.3  14.0   Hematocrit 36.0 - 46.0 % 39.6  40.2  41.9   Platelets 150 - 400 K/uL 164  179  177       Latest Ref Rng & Units 06/15/2022    1:47 PM 05/25/2022   12:56 PM 05/04/2022    1:28 PM  CMP  Glucose 70 - 99 mg/dL 87  92  105   BUN 8 - 23 mg/dL _0 Creatinine 0.44 - 1.00 mg/dL 0.69  0.93  0.91   Sodium 135 - 145 mmol/L 139  138  140   Potassium 3.5 - 5.1 mmol/L 3.9  3.8  3.9   Chloride 98 - 111 mmol/L 104  102  105   CO2 22 - 32 mmol/L _1 Calcium 8.9 - 10.3 mg/dL 8.4  9.1  8.6   Total Protein 6.5 - 8.1 g/dL 6.8  7.4  7.1   Total Bilirubin 0.3 - 1.2 mg/dL 0.7  0.6  0.5   Alkaline Phos 38 - 126 U/L 76  77  74   AST 15 - 41 U/L _2 ALT 0 - 44 U/L _3 RADIOGRAPHIC STUDIES: I have personally reviewed the radiological images as listed and agreed with the findings in the report. Exercise Tolerance Test  Result Date: 06/14/2022   No ST deviation was noted.   Prior study not available for comparison. Inconclusive stress test due to lack of heart rate augmentation.  Hypertensive exercise response. No ST changes or ectopy noted during study.   ECHOCARDIOGRAM COMPLETE  Result Date: 05/31/2022    ECHOCARDIOGRAM REPORT   Patient Name:   CAMBRE MATSON Date of Exam: 05/31/2022 Medical Rec #:  [id number]        Height:       65.0 in Accession #:    [id number]  Weight:       258.0 lb Date of Birth:  05-Mar-1951        BSA:          2.204 m Patient Age:    46 years          BP:           161/84 mmHg Patient Gender: F                HR:           56 bpm. Exam Location:  [facility] Procedure: 2D Echo, Cardiac Doppler, Color Doppler and Strain Analysis Indications:     Chemo Z09  History:         Patient has prior history of Echocardiogram examinations, most                  recent 02/25/2022. CHF, COPD; Risk Factors:Hypertension.  Sonographer:     Sherrie Sport Referring Phys:  [id number] Fynley Chrystal Diagnosing Phys: Nelva Bush MD  Sonographer Comments: Global longitudinal strain was attempted. IMPRESSIONS  1. Left ventricular ejection fraction, by estimation, is 55 to 60%. The left ventricle has normal function. The left ventricle has no regional wall motion abnormalities. There is mild left ventricular hypertrophy. Left ventricular diastolic parameters are consistent with Grade II diastolic dysfunction (pseudonormalization). Elevated left atrial pressure. The average left ventricular global longitudinal strain is -16.0 %. The global longitudinal strain is normal.  2. Right ventricular systolic function is normal. The right ventricular size is normal. There is normal pulmonary artery systolic pressure.  3. The mitral valve is degenerative. Mild to moderate mitral valve regurgitation.  4. The aortic valve was not well visualized. Aortic valve regurgitation is not visualized. No aortic stenosis is present.  5. The inferior vena cava is normal in size with greater than 50% respiratory variability, suggesting right atrial pressure of 3 mmHg. FINDINGS  Left Ventricle: Left ventricular ejection fraction, by estimation, is 55 to 60%. The left ventricle has normal function. The left ventricle has no regional wall motion abnormalities. The average left ventricular global longitudinal strain is -16.0 %. The global longitudinal strain is normal. The left ventricular internal cavity size was normal in size. There is mild left ventricular hypertrophy. Left ventricular diastolic parameters are consistent with  Grade II diastolic dysfunction (pseudonormalization). Elevated left atrial pressure. Right Ventricle: The right ventricular size is normal. No increase in right ventricular wall thickness. Right ventricular systolic function is normal. There is normal pulmonary artery systolic pressure. The tricuspid regurgitant velocity is 2.34 m/s, and  with an assumed right atrial pressure of 3 mmHg, the estimated right ventricular systolic pressure is 29.9 mmHg. Left Atrium: Left atrial size was normal in size. Right Atrium: Right atrial size was normal in size. Pericardium: There is no evidence of pericardial effusion. Presence of epicardial fat layer. Mitral Valve: The mitral valve is degenerative in appearance. There is mild thickening of the mitral valve leaflet(s). There is mild calcification of the mitral valve leaflet(s). Mild mitral annular calcification. Mild to moderate mitral valve regurgitation. Tricuspid Valve: The tricuspid valve is not well visualized. Tricuspid valve regurgitation is mild. Aortic Valve: The aortic valve was not well visualized. Aortic valve regurgitation is not visualized. No aortic stenosis is present. Aortic valve mean gradient measures 5.0 mmHg. Aortic valve peak gradient measures 9.7 mmHg. Aortic valve area, by VTI measures 2.24 cm. Pulmonic Valve: The pulmonic valve was not well visualized. Pulmonic valve regurgitation is not visualized. No evidence of  pulmonic stenosis. Aorta: The aortic root is normal in size and structure. Pulmonary Artery: The pulmonary artery is not well seen. Venous: The inferior vena cava is normal in size with greater than 50% respiratory variability, suggesting right atrial pressure of 3 mmHg. IAS/Shunts: No atrial level shunt detected by color flow Doppler.  LEFT VENTRICLE PLAX 2D LVIDd:         4.30 cm     Diastology LVIDs:         3.00 cm     LV e' medial:    6.09 cm/s LV PW:         1.21 cm     LV E/e' medial:  16.6 LV IVS:        1.15 cm     LV e' lateral:    10.60 cm/s LVOT diam:     2.10 cm     LV E/e' lateral: 9.5 LV SV:         74 LV SV Index:   34          2D Longitudinal Strain LVOT Area:     3.46 cm    2D Strain GLS Avg:     -16.0 %  LV Volumes (MOD) LV vol d, MOD A2C: 77.4 ml LV vol d, MOD A4C: 88.5 ml LV vol s, MOD A2C: 38.6 ml LV vol s, MOD A4C: 29.7 ml LV SV MOD A2C:     38.7 ml LV SV MOD A4C:     88.5 ml LV SV MOD BP:      51.6 ml RIGHT VENTRICLE RV Basal diam:  3.50 cm RV Mid diam:    3.00 cm RV S prime:     11.30 cm/s TAPSE (M-mode): 2.5 cm LEFT ATRIUM             Index        RIGHT ATRIUM           Index LA diam:        3.60 cm 1.63 cm/m   RA Area:     16.80 cm LA Vol (A2C):   39.4 ml 17.87 ml/m  RA Volume:   49.90 ml  22.64 ml/m LA Vol (A4C):   47.5 ml 21.55 ml/m LA Biplane Vol: 46.8 ml 21.23 ml/m  AORTIC VALVE AV Area (Vmax):    2.04 cm AV Area (Vmean):   2.19 cm AV Area (VTI):     2.24 cm AV Vmax:           156.00 cm/s AV Vmean:          101.000 cm/s AV VTI:            0.331 m AV Peak Grad:      9.7 mmHg AV Mean Grad:      5.0 mmHg LVOT Vmax:         91.80 cm/s LVOT Vmean:        63.800 cm/s LVOT VTI:          0.214 m LVOT/AV VTI ratio: 0.65  AORTA Ao Root diam: 3.50 cm MITRAL VALVE                TRICUSPID VALVE MV Area (PHT): 3.53 cm     TR Peak grad:   21.9 mmHg MV Decel Time: 215 msec     TR Vmax:        234.00 cm/s MV E velocity: 101.00 cm/s MV A velocity: 67.10 cm/s   SHUNTS MV E/A ratio:  1.51         Systemic VTI:  0.21 m                             Systemic Diam: 2.10 cm Nelva Bush MD Electronically signed by Nelva Bush MD Signature Date/Time: 05/31/2022/6:23:36 PM    Final

## 2022-06-15 NOTE — Assessment & Plan Note (Signed)
Patient is on Eliquis 5 mg twice daily. On Flecainide. Follow-up with cardiology.

## 2022-06-15 NOTE — Assessment & Plan Note (Addendum)
#  Right breast invasive carcinoma pT1b pN0 ER negative, PR weakly positive HER 2 positive.  Patient does not tolerate adjuvant chemotherapy with Taxol and trastuzumab despite Taxol dose reduction.-->Status post adjuvant radiation--> maintenance Trastuzumab Labs are reviewed and discussed with patient. Proceed with trastuzumab maintenance. Nov 2023 Echo showed mild decrease [5%] LVEF Obtain right diagnostic mammogram in December 2023

## 2022-06-15 NOTE — Assessment & Plan Note (Signed)
Treatment plan as needed.

## 2022-06-16 ENCOUNTER — Other Ambulatory Visit: Payer: Self-pay | Admitting: Family

## 2022-06-16 ENCOUNTER — Other Ambulatory Visit: Payer: Self-pay | Admitting: Oncology

## 2022-06-16 DIAGNOSIS — F419 Anxiety disorder, unspecified: Secondary | ICD-10-CM

## 2022-06-18 ENCOUNTER — Other Ambulatory Visit: Payer: Self-pay | Admitting: Family

## 2022-06-22 ENCOUNTER — Encounter: Payer: Self-pay | Admitting: Cardiovascular Disease

## 2022-07-06 ENCOUNTER — Inpatient Hospital Stay (HOSPITAL_BASED_OUTPATIENT_CLINIC_OR_DEPARTMENT_OTHER): Payer: Medicare Other | Admitting: Oncology

## 2022-07-06 ENCOUNTER — Ambulatory Visit
Admission: RE | Admit: 2022-07-06 | Discharge: 2022-07-06 | Disposition: A | Discharge status: Home / Self Care | Payer: Medicare Other | Source: Ambulatory Visit | Attending: Oncology | Admitting: Oncology

## 2022-07-06 ENCOUNTER — Encounter: Payer: Self-pay | Admitting: Oncology

## 2022-07-06 ENCOUNTER — Inpatient Hospital Stay: Payer: Medicare Other

## 2022-07-06 VITALS — BP 131/87 | HR 70 | Temp 96.3°F | Wt 266.7 lb

## 2022-07-06 DIAGNOSIS — Z171 Estrogen receptor negative status [ER-]: Secondary | ICD-10-CM | POA: Diagnosis not present

## 2022-07-06 DIAGNOSIS — C50919 Malignant neoplasm of unspecified site of unspecified female breast: Secondary | ICD-10-CM

## 2022-07-06 DIAGNOSIS — Z5112 Encounter for antineoplastic immunotherapy: Secondary | ICD-10-CM

## 2022-07-06 DIAGNOSIS — C50411 Malignant neoplasm of upper-outer quadrant of right female breast: Secondary | ICD-10-CM | POA: Diagnosis not present

## 2022-07-06 DIAGNOSIS — I4891 Unspecified atrial fibrillation: Secondary | ICD-10-CM | POA: Diagnosis not present

## 2022-07-06 DIAGNOSIS — Z923 Personal history of irradiation: Secondary | ICD-10-CM | POA: Diagnosis not present

## 2022-07-06 DIAGNOSIS — G629 Polyneuropathy, unspecified: Secondary | ICD-10-CM | POA: Diagnosis not present

## 2022-07-06 DIAGNOSIS — Z87891 Personal history of nicotine dependence: Secondary | ICD-10-CM | POA: Diagnosis not present

## 2022-07-06 DIAGNOSIS — J449 Chronic obstructive pulmonary disease, unspecified: Secondary | ICD-10-CM | POA: Diagnosis not present

## 2022-07-06 DIAGNOSIS — M8580 Other specified disorders of bone density and structure, unspecified site: Secondary | ICD-10-CM | POA: Diagnosis not present

## 2022-07-06 DIAGNOSIS — Z8711 Personal history of peptic ulcer disease: Secondary | ICD-10-CM | POA: Diagnosis not present

## 2022-07-06 DIAGNOSIS — I480 Paroxysmal atrial fibrillation: Secondary | ICD-10-CM

## 2022-07-06 DIAGNOSIS — K219 Gastro-esophageal reflux disease without esophagitis: Secondary | ICD-10-CM | POA: Diagnosis not present

## 2022-07-06 DIAGNOSIS — Z806 Family history of leukemia: Secondary | ICD-10-CM | POA: Diagnosis not present

## 2022-07-06 DIAGNOSIS — Z79899 Other long term (current) drug therapy: Secondary | ICD-10-CM | POA: Diagnosis not present

## 2022-07-06 DIAGNOSIS — Z803 Family history of malignant neoplasm of breast: Secondary | ICD-10-CM | POA: Diagnosis not present

## 2022-07-06 DIAGNOSIS — Z7901 Long term (current) use of anticoagulants: Secondary | ICD-10-CM | POA: Diagnosis not present

## 2022-07-06 DIAGNOSIS — E559 Vitamin D deficiency, unspecified: Secondary | ICD-10-CM | POA: Diagnosis not present

## 2022-07-06 DIAGNOSIS — E785 Hyperlipidemia, unspecified: Secondary | ICD-10-CM | POA: Diagnosis not present

## 2022-07-06 DIAGNOSIS — Z9221 Personal history of antineoplastic chemotherapy: Secondary | ICD-10-CM | POA: Diagnosis not present

## 2022-07-06 DIAGNOSIS — I10 Essential (primary) hypertension: Secondary | ICD-10-CM | POA: Diagnosis not present

## 2022-07-06 DIAGNOSIS — I509 Heart failure, unspecified: Secondary | ICD-10-CM | POA: Diagnosis not present

## 2022-07-06 DIAGNOSIS — R921 Mammographic calcification found on diagnostic imaging of breast: Secondary | ICD-10-CM | POA: Diagnosis not present

## 2022-07-06 LAB — COMPREHENSIVE METABOLIC PANEL
ALT: 18 U/L (ref 0–44)
AST: 21 U/L (ref 15–41)
Albumin: 3.8 g/dL (ref 3.5–5.0)
Alkaline Phosphatase: 79 U/L (ref 38–126)
Anion gap: 10 (ref 5–15)
BUN: 15 mg/dL (ref 8–23)
CO2: 27 mmol/L (ref 22–32)
Calcium: 8.4 mg/dL — ABNORMAL LOW (ref 8.9–10.3)
Chloride: 104 mmol/L (ref 98–111)
Creatinine, Ser: 0.91 mg/dL (ref 0.44–1.00)
GFR, Estimated: >60 mL/min (ref >60)
Glucose, Bld: 114 mg/dL — ABNORMAL HIGH (ref 70–99)
Potassium: 3.9 mmol/L (ref 3.5–5.1)
Sodium: 141 mmol/L (ref 135–145)
Total Bilirubin: 0.5 mg/dL (ref 0.3–1.2)
Total Protein: 7 g/dL (ref 6.5–8.1)

## 2022-07-06 LAB — CBC WITH DIFFERENTIAL/PLATELET
Abs Immature Granulocytes: 0.01 10*3/uL (ref 0.00–0.07)
Basophils Absolute: 0 10*3/uL (ref 0.0–0.1)
Basophils Relative: 0 %
Eosinophils Absolute: 0.1 10*3/uL (ref 0.0–0.5)
Eosinophils Relative: 1 %
HCT: 39.5 % (ref 36.0–46.0)
Hemoglobin: 13.1 g/dL (ref 12.0–15.0)
Immature Granulocytes: 0 %
Lymphocytes Relative: 24 %
Lymphs Abs: 1.9 10*3/uL (ref 0.7–4.0)
MCH: 30.4 pg (ref 26.0–34.0)
MCHC: 33.2 g/dL (ref 30.0–36.0)
MCV: 91.6 fL (ref 80.0–100.0)
Monocytes Absolute: 0.4 10*3/uL (ref 0.1–1.0)
Monocytes Relative: 5 %
Neutro Abs: 5.5 10*3/uL (ref 1.7–7.7)
Neutrophils Relative %: 70 %
Platelets: 175 10*3/uL (ref 150–400)
RBC: 4.31 MIL/uL (ref 3.87–5.11)
RDW: 13.3 % (ref 11.5–15.5)
WBC: 7.8 10*3/uL (ref 4.0–10.5)
nRBC: 0 % (ref 0.0–0.2)

## 2022-07-06 LAB — VITAMIN D 25 HYDROXY (VIT D DEFICIENCY, FRACTURES): Vit D, 25-Hydroxy: 33.34 ng/mL (ref 30–100)

## 2022-07-06 MED ORDER — DIPHENHYDRAMINE HCL 25 MG PO CAPS
50.0000 mg | ORAL_CAPSULE | Freq: Once | ORAL | Status: AC
  Administered 2022-07-06: 50 mg via ORAL
  Filled 2022-07-06: qty 2

## 2022-07-06 MED ORDER — HEPARIN SOD (PORK) LOCK FLUSH 100 UNIT/ML IV SOLN
500.0000 [IU] | Freq: Once | INTRAVENOUS | Status: AC | PRN
  Administered 2022-07-06: 500 [IU]
  Filled 2022-07-06: qty 5

## 2022-07-06 MED ORDER — SODIUM CHLORIDE 0.9 % IV SOLN
Freq: Once | INTRAVENOUS | Status: AC
  Filled 2022-07-06: qty 250

## 2022-07-06 MED ORDER — TRASTUZUMAB-ANNS CHEMO 150 MG IV SOLR
6.0000 mg/kg | Freq: Once | INTRAVENOUS | Status: AC
  Administered 2022-07-06: 693 mg via INTRAVENOUS
  Filled 2022-07-06: qty 33

## 2022-07-06 MED ORDER — ACETAMINOPHEN 325 MG PO TABS
650.0000 mg | ORAL_TABLET | Freq: Once | ORAL | Status: AC
  Administered 2022-07-06: 650 mg via ORAL
  Filled 2022-07-06: qty 2

## 2022-07-06 MED ORDER — SODIUM CHLORIDE 0.9 % IV SOLN
1.0000 g | Freq: Once | INTRAVENOUS | Status: AC
  Administered 2022-07-06: 1 g via INTRAVENOUS
  Filled 2022-07-06: qty 10

## 2022-07-06 MED ORDER — SODIUM CHLORIDE 0.9 % IV SOLN: 2.0000 g | Freq: Once | INTRAVENOUS | Status: DC

## 2022-07-06 NOTE — Assessment & Plan Note (Signed)
Patient is on Eliquis 5 mg twice daily. On Flecainide. Follow-up with cardiology. -she is going to establish care with EP

## 2022-07-06 NOTE — Assessment & Plan Note (Signed)
Treatment plan as needed.

## 2022-07-06 NOTE — Progress Notes (Signed)
Hematology/Oncology Progress note [phone] [phone]      ASSESSMENT & PLAN:   Cancer Staging  Malignant neoplasm of upper-outer quadrant of right female breast [facility]) Staging form: Breast, AJCC 8th Edition - Pathologic stage from 03/03/2021: Stage IA (pT1b, pN0, cM0, G3, ER-, PR+, HER2+) - Signed by Amber Server, MD on 03/03/2021   Malignant neoplasm of upper-outer quadrant of right female breast [facility]) #Right breast invasive carcinoma pT1b pN0 ER negative, PR weakly positive HER 2 positive.  Patient does not tolerate adjuvant chemotherapy with Taxol and trastuzumab despite Taxol dose reduction.-->Status post adjuvant radiation--> maintenance Trastuzumab Labs are reviewed and discussed with patient. Proceed with trastuzumab maintenance. Nov 2023 Echo showed mild decrease [5%] of LVEF- monitor   Atrial fibrillation [facility]) Patient is on Eliquis 5 mg twice daily. On Flecainide. Follow-up with cardiology. -she is going to establish care with EP  Encounter for monoclonal antibody treatment for malignancy Treatment plan as needed.   Hypocalcemia She is on calcium 2000 mg daily.  Check vitamin D level.  IV calcium gluconate 2g x 1  Follow up  3 weeks lab MD trastuzumab.   All questions were answered. The patient knows to call the clinic with any problems, questions or concerns.  Amber Server, MD, PhD [facility] Hematology Oncology 07/06/2022   CHIEF COMPLAINTS/REASON FOR VISIT:  Follow-up for stage I HER2 positive breast cancer  HISTORY OF PRESENTING ILLNESS:   Amber Barnes is a  71 y.o.  female presents for follow up of right breast cancer.  Oncology History  Malignant neoplasm of upper-outer quadrant of right female breast ([place])  01/19/2021 Initial Diagnosis   Breast cancer in female  -12/30/2020, patient had a screening mammogram done which showed possible mass and asymmetry in the right breast. 01/08/2021, diagnostic mammogram of the right breast  showed 2 adjacent irregular spiculated masses.  Larger 1 was 10 mm and a smaller mass measured 5 mm. 01/15/2021 stereotactic biopsy of the right breast smaller mass showed invasive carcinoma no special type, grade 2, DCIS present, LVI negative.Ultrasound guided biopsy of the larger mass showed invasive mammary carcinoma  02/17/2021, patient is status post lumpectomy and sentinel lymph node biopsy Pathology showed invasive mammary carcinoma, 2 separate foci, DCIS, negative sentinel lymph node biopsy 0/1, grade 3, or margins negative for invasive carcinoma.  ER -, PR + [1-10%], HER2 +  pT1b(m) pN0   Strong family history of cancer, Discussed again about genetic testing. patient adamantly declined.   03/03/2021 Cancer Staging   Staging form: Breast, AJCC 8th Edition - Pathologic stage from 03/03/2021: Stage IA (pT1b, pN0, cM0, G3, ER-, PR+, HER2+) - Signed by Amber Server, MD on 03/03/2021 Stage prefix: Initial diagnosis Multigene prognostic tests performed: None Histologic grading system: 3 grade system   03/08/2021 Echocardiogram   echocardiogram showed LVEF 60-65%. Per patient, she has been seen by Dr. [name] team and was cleared for proceeding with chemotherapy.    02/2021 Genetic Testing   Strong family history of cancer, Discussed again about genetic testing. patient adamantly declined.   03/23/2021 Procedure   Mediport by vascular surgeon   03/24/2021 -  Chemotherapy   03/24/2021 - 03/31/2021 Taxol 80 mg/m2 Amber Barnes 04/05/2021 - 04/09/2021 patient was admitted due to neutropenic fever with associated SIRS.  Patient was treated with empiric cefepime and vancomycin until date of discharge when Olga recovered to 1.4. Post hospitalization, patient also had COVID-19 infection so her follow-up appointment was further postponed for another 2 weeks.  05/10/2021 resumed dose reduce  Taxol 63m/m2 /Amber Critchley11/15/2022-06/30/2021, cycle 2 dose reduced Taxol 672mm2 /trastuzumab   admitted from  07/12/2021 - 07/17/2021 due to acute pyelonephritis UVJ obstruction and a right hydro ureteralnephrosis.  Patient was treated with IV antibiotics status post emergent bilateral ureteral stent placement.  Discharged home with a course of oral antibiotics.  Shared decision made to hold chemotherapy and proceed with radiation.    08/18/2021 - 09/08/2021 Radiation Therapy    adjuvant breast radiation   10/06/2021 -  Chemotherapy   10/06/2021, shared decision was made to proceed with maintenance trastuzumab.  Her cardiologist Dr. GoRockey Situave clearance to proceed. Plan Transtuzumab for up to a year if tolerates.    12/14/2021 Mammogram   Bilateral diagnostic mammogram 1. Probably benign right breast calcifications along the posterior lumpectomy bed felt to represent early changes of fat necrosis.Recommend short-term imaging follow-up. 2. No mammographic evidence of malignancy on the left.   03/02/2022 Echocardiogram   LVEF 6064-40%grade I diastolic dysfuction   1134/74/2595chocardiogram   LVEF is 55 to 60%, Grade II diastolic dysfunction     Imaging   Unilateral diagnostic mammogram -right 1. No evidence of malignancy. 2. The previously described probably benign right breast calcifications are benign calcifications associated with fat necrosis and oil cyst formation.   HER2-positive carcinoma of breast ([place]   Left butt/thigh tenderness for a few weeks, ultrasound soft tissue left lower extremity negative.   INTERVAL HISTORY JaKijuana Barnes a [age].o. female who has above history reviewed by me today presents for follow up visit for management of right side HER 2 positive breast cancer She feels well today. No new complaints.    Review of Systems  Constitutional:  Positive for fatigue. Negative for appetite change, chills, fever and unexpected weight change.  HENT:   Negative for hearing loss and voice change.   Respiratory:  Negative for chest tightness and cough.   Cardiovascular:   Negative for chest pain and leg swelling.  Gastrointestinal:  Negative for abdominal distention, abdominal pain, blood in stool, nausea and vomiting.  Endocrine: Negative for hot flashes.  Genitourinary:  Negative for difficulty urinating, frequency and hematuria.   Musculoskeletal:  Positive for arthralgias.  Skin:  Negative for itching and rash.  Neurological:  Positive for numbness. Negative for dizziness and extremity weakness.  Hematological:  Negative for adenopathy.  Psychiatric/Behavioral:  Negative for confusion.     MEDICAL HISTORY:  Past Medical History:  Diagnosis Date   Achilles tendinitis    Acute medial meniscal tear    Allergy    Anxiety    Atrial fibrillation ([facility]   a. new onset 07/2016; b. CHADS2VASc --> 4 (HTN, female, age, CHF); c. eliquis   CHF (congestive heart failure) (HCC)    Chronic anticoagulation    Apixaban   Chronic lower back pain    Colon polyp    Complication of anesthesia    Emergence delirium; hypoxia   COPD (chronic obstructive pulmonary disease) ([place]   Cystic breast    Depression    Endometriosis    Fibrocystic breast disease    GERD (gastroesophageal reflux disease)    History of stress test    a. 07/2016 MV: EF 55-65%. Small, mild defect  in mid anteroseptal, apical septal, and apical locations. No ischemia-->low risk.   Hyperlipidemia    Hypertension    Insomnia    Lipoma    Malignant [place] of upper-outer quadrant of right [place] breast ([place]   Stage 1A invasive  mammary carcinoma (cT1b or T1c N0); ER (-), PR (+), HER2/neu (+)   Migraine    Mild Mitral regurgitation    a. 07/2016 Echo: EF 55-65%, no rwma, mild MR. Mildly dil LA. Nl RV fx.    Nephrolithiasis    OSA (obstructive sleep apnea)    non-compliant with nocturnal PAP therapy   Osteoarthritis    left knee   Osteopenia    Peptic ulcer disease    a. H. pylori   Pneumonia    RLS (restless legs syndrome)    Tendonitis    Thyroid disease    Vitamin D deficiency      SURGICAL HISTORY: Past Surgical History:  Procedure Laterality Date   ABDOMINAL HYSTERECTOMY     ovaries left   ACHILLES TENDON SURGERY     2012,01/2017   BACK SURGERY     fusion lumbar   BREAST BIOPSY Right 01/15/2021   Affirm biopsy-"X" clip-INVASIVE MAMMARY CARCINOMA   BREAST BIOPSY Right 01/15/2021   u/s bx-"venus" clip INVASIVE MAMMARY CARCINOMA   BREAST BIOPSY     BREAST LUMPECTOMY,RADIO FREQ LOCALIZER,AXILLARY SENTINEL LYMPH NODE BIOPSY Right 02/17/2021   Procedure: BREAST LUMPECTOMY,RADIO FREQ LOCALIZER,AXILLARY SENTINEL LYMPH NODE BIOPSY;  Surgeon: Ronny Bacon, MD;  Location: [facility];  Service: General;  Laterality: Right;   CARDIOVERSION N/A 09/07/2016   Procedure: CARDIOVERSION;  Surgeon: Minna Merritts, MD;  Location: [facility];  Service: Cardiovascular;  Laterality: N/A;   CARDIOVERSION N/A 10/19/2016   Procedure: CARDIOVERSION;  Surgeon: Minna Merritts, MD;  Location: [facility];  Service: Cardiovascular;  Laterality: N/A;   CARDIOVERSION N/A 12/14/2016   Procedure: CARDIOVERSION;  Surgeon: Minna Merritts, MD;  Location: [facility];  Service: Cardiovascular;  Laterality: N/A;   COLONOSCOPY     COLONOSCOPY WITH PROPOFOL N/A 05/13/2020   Procedure: COLONOSCOPY WITH PROPOFOL;  Surgeon: Toledo, Benay Pike, MD;  Location: [facility];  Service: Gastroenterology;  Laterality: N/A;  ELIQUIS   CYSTOSCOPY WITH STENT PLACEMENT Bilateral 07/12/2021   Procedure: CYSTOSCOPY WITH STENT PLACEMENT;  Surgeon: Billey Co, MD;  Location: [facility];  Service: Urology;  Laterality: Bilateral;   CYSTOSCOPY/URETEROSCOPY/HOLMIUM LASER/STENT PLACEMENT Bilateral 07/30/2021   Procedure: CYSTOSCOPY/URETEROSCOPY/HOLMIUM LASER/BILATERAL STENT REMOVAL;  Surgeon: Billey Co, MD;  Location: [facility];  Service: Urology;  Laterality: Bilateral;   ESOPHAGOGASTRODUODENOSCOPY     ESOPHAGOGASTRODUODENOSCOPY (EGD) WITH PROPOFOL N/A 05/13/2020   Procedure: ESOPHAGOGASTRODUODENOSCOPY (EGD)  WITH PROPOFOL;  Surgeon: Toledo, Benay Pike, MD;  Location: [facility];  Service: Gastroenterology;  Laterality: N/A;   FOOT SURGERY Left    tendon   JOINT REPLACEMENT Right    hip   PORTA CATH INSERTION N/A 03/23/2021   Procedure: PORTA CATH INSERTION;  Surgeon: Katha Cabal, MD;  Location: [facility];  Service: Cardiovascular;  Laterality: N/A;   PORTACATH PLACEMENT N/A 03/23/2021   Procedure: ATTEMPTED INSERTION PORT-A-CATH;  Surgeon: Ronny Bacon, MD;  Location: [facility];  Service: General;  Laterality: N/A;   THYROIDECTOMY     thyroid lobectomy secondary to a benign nodule   TOTAL HIP ARTHROPLASTY Right 08/21/2018   Procedure: TOTAL HIP ARTHROPLASTY ANTERIOR APPROACH-RIGHT CELL SAVER REQUESTED;  Surgeon: [name], MD;  Location: [facility];  Service: Orthopedics;  Laterality: Right;    SOCIAL HISTORY: Social History   Socioeconomic History   Marital status: Married    Spouse name: jeffrey   Number of children: 1   Years of education: Not on file   Highest education level: High school graduate  Occupational History  Comment: disabled  Tobacco Use   Smoking status: Former    Packs/day: 0.25    Years: 10.00    Total pack years: 2.50    Types: Cigarettes    Quit date: 2005    Years since quitting: 18.9   Smokeless tobacco: Never   Tobacco comments:    smoked for 10 years, 1 pack every 2-3 days  Vaping Use   Vaping Use: Never used  Substance and Sexual Activity   Alcohol use: Not Currently    Alcohol/week: 1.0 standard drink of alcohol    Types: 1 Glasses of wine per week    Comment: rarely   Drug use: No   Sexual activity: Not Currently  Other Topics Concern   Not on file  Social History Narrative   Moved to [place] from [place], originally from [place].    1 cup of caffeine daily   Right handed   Lives with husband, step-daughter and child       Social Determinants of Health   Financial Resource Strain: Low  Risk  (12/09/2021)   Overall Financial Resource Strain (CARDIA)    Difficulty of Paying Living Expenses: Not very hard  Food Insecurity: No Food Insecurity (12/09/2021)   Hunger Vital Sign    Worried About Running Out of Food in the Last Year: Never true    Ran Out of Food in the Last Year: Never true  Transportation Needs: No Transportation Needs (12/09/2021)   PRAPARE - Hydrologist (Medical): No    Lack of Transportation (Non-Medical): No  Physical Activity: Insufficiently Active (12/09/2021)   Exercise Vital Sign    Days of Exercise per Week: 7 days    Minutes of Exercise per Session: 20 min  Stress: Stress Concern Present (12/09/2021)   [place]    Feeling of Stress : Very much  Social Connections: Moderately Isolated (12/09/2021)   Social Connection and Isolation Panel [NHANES]    Frequency of Communication with Friends and Family: Never    Frequency of Social Gatherings with Friends and Family: Once a week    Attends Religious Services: More than 4 times per year    Active Member of [employer] or Organizations: No    Attends Archivist Meetings: Never    Marital Status: Married  Human resources officer Violence: Not At Risk (12/09/2021)   Humiliation, Afraid, Rape, and Kick questionnaire    Fear of Current or Ex-Partner: No    Emotionally Abused: No    Physically Abused: No    Sexually Abused: No    FAMILY HISTORY: Family History  Problem Relation Age of Onset   Cancer Mother        leukemia   Cancer Father 32       colon   Heart disease Father    Heart attack Father 54       died from MI   Hyperlipidemia Father    Hypertension Father    Cancer Sister 31       ovarian   Depression Sister    Cancer Brother        CLL   Breast cancer Paternal Aunt        40's   Migraines Neg Hx    Thyroid cancer Neg Hx     ALLERGIES:  is allergic to prednisone, amiodarone,  hydrocodone-acetaminophen, oxycodone, tapentadol, tramadol, adhesive [tape], and latex.  MEDICATIONS:  Current Outpatient Medications  Medication  Sig Dispense Refill   acetaminophen (TYLENOL) 500 MG tablet Take 500-1,000 mg by mouth every 6 (six) hours as needed for mild pain or moderate pain.     ALPRAZolam (XANAX) 0.25 MG tablet TAKE 1 TABLET BY MOUTH 2 TIMES DAILY AS NEEDED FOR ANXIETY. 60 tablet 2   apixaban (ELIQUIS) 5 MG TABS tablet TAKE 1 TABLET BY MOUTH TWICE A DAY 180 tablet 1   bisoprolol (ZEBETA) 10 MG tablet Take 1 tablet (10 mg total) by mouth 2 (two) times daily. 120 tablet 3   busPIRone (BUSPAR) 5 MG tablet TAKE 1 TABLET BY MOUTH THREE TIMES A DAY AS NEEDED 60 tablet 1   Cholecalciferol (VITAMIN D3) 5000 units CAPS Take 5,000 Units by mouth daily.     clotrimazole (LOTRIMIN) 1 % cream Apply 1 application topically 2 (two) times daily. 30 g 0   diltiazem (CARDIZEM CD) 240 MG 24 hr capsule Take 240 mg by mouth as needed.     diphenhydrAMINE (BENADRYL) 25 mg capsule Take 1 capsule (25 mg total) by mouth every 8 (eight) hours as needed for itching or allergies. 30 capsule 0   esomeprazole (NEXIUM) 20 MG capsule Take 20 mg by mouth daily as needed (GERD symptoms).     flecainide (TAMBOCOR) 100 MG tablet Take 1 tablet (100 mg total) by mouth 2 (two) times daily. 180 tablet 3   FLUoxetine (PROZAC) 20 MG capsule Take 1 capsule (20 mg total) by mouth every morning. 90 capsule 3   furosemide (LASIX) 20 MG tablet TAKE 1 TABLET BY MOUTH TWICE A DAY 180 tablet 0   gabapentin (NEURONTIN) 100 MG capsule TAKE 1 CAPSULE (100 MG TOTAL) BY MOUTH 3 TIMES A DAY 270 capsule 0   KLOR-CON M20 20 MEQ tablet TAKE 1 TABLET BY MOUTH EVERY DAY 90 tablet 1   Multiple Vitamin (MULTIVITAMIN) tablet Take 1 tablet by mouth daily.     nystatin (MYCOSTATIN/NYSTOP) powder Apply 1 application. topically 3 (three) times daily. 60 g 11   nystatin cream (MYCOSTATIN) Apply 1 application. topically 2 (two) times daily.  Prn abdomen 60 g 5   ondansetron (ZOFRAN) 8 MG tablet TAKE 1 TABLET BY MOUTH EVERY 8 HOURS AS NEEDED FOR NAUSEA OR VOMITING. 60 tablet 0   promethazine (PHENERGAN) 25 MG tablet Take 1 tablet (25 mg total) by mouth every 6 (six) hours as needed for nausea or vomiting. 60 tablet 0   ranolazine (RANEXA) 500 MG 12 hr tablet TAKE 1 TABLET BY MOUTH TWICE A DAY 180 tablet 1   rizatriptan (MAXALT) 10 MG tablet TAKE 1 TABLET (10 MG TOTAL) BY MOUTH AS NEEDED FOR MIGRAINE. MAY REPEAT IN 2 HOURS IF NEEDED 10 tablet 0   rOPINIRole (REQUIP) 2 MG tablet TAKE 1 TABLET BY MOUTH EVERYDAY AT BEDTIME 90 tablet 1   rosuvastatin (CRESTOR) 20 MG tablet TAKE 1 TABLET BY MOUTH EVERY DAY 90 tablet 1   rosuvastatin (CRESTOR) 40 MG tablet Take 1 tablet (40 mg total) by mouth daily. 90 tablet 3   meclizine (ANTIVERT) 12.5 MG tablet Take 1 tablet (12.5 mg total) by mouth 3 (three) times daily as needed for dizziness. (Patient not taking: Reported on 05/09/2022) 30 tablet 0   No current facility-administered medications for this visit.     PHYSICAL EXAMINATION: ECOG PERFORMANCE STATUS: 1 - Symptomatic but completely ambulatory Vitals:   07/06/22 1305  BP: 131/87  Pulse: 70  Temp: (!) 96.3 F (35.7 C)  SpO2: 99%    Filed Weights  07/06/22 1305  Weight: 266 lb 11.2 oz (121 kg)     Physical Exam Constitutional:      General: She is not in acute distress.    Appearance: She is obese.  HENT:     Head: Normocephalic and atraumatic.  Eyes:     General: No scleral icterus. Cardiovascular:     Rate and Rhythm: Normal rate. Rhythm irregular.     Heart sounds: Normal heart sounds.  Pulmonary:     Effort: Pulmonary effort is normal. No respiratory distress.     Breath sounds: No wheezing.  Abdominal:     General: Bowel sounds are normal. There is no distension.     Palpations: Abdomen is soft.  Musculoskeletal:        General: No deformity. Normal range of motion.     Cervical back: Normal range of motion  and neck supple.     Comments: 1+ edema bilaterally in lower extremity Small left scapular soft tissue mass,   Skin:    General: Skin is warm and dry.     Findings: No erythema or rash.  Neurological:     Mental Status: She is alert and oriented to person, place, and time. Mental status is at baseline.     Cranial Nerves: No cranial nerve deficit.     Coordination: Coordination normal.  Psychiatric:        Mood and Affect: Mood normal.     . LABORATORY DATA:  I have reviewed the data as listed    Latest Ref Rng & Units 07/06/2022   12:37 PM 06/15/2022    1:47 PM 05/25/2022   12:56 PM  CBC  WBC 4.0 - 10.5 K/uL 7.8  5.6  6.3   Hemoglobin 12.0 - 15.0 g/dL 13.1  13.4  13.3   Hematocrit 36.0 - 46.0 % 39.5  39.6  40.2   Platelets 150 - 400 K/uL 175  164  179       Latest Ref Rng & Units 07/06/2022   12:37 PM 06/15/2022    1:47 PM 05/25/2022   12:56 PM  CMP  Glucose 70 - 99 mg/dL 114  87  92   BUN 8 - 23 mg/dL _0 Creatinine 0.44 - 1.00 mg/dL 0.91  0.69  0.93   Sodium 135 - 145 mmol/L 141  139  138   Potassium 3.5 - 5.1 mmol/L 3.9  3.9  3.8   Chloride 98 - 111 mmol/L 104  104  102   CO2 22 - 32 mmol/L _1 Calcium 8.9 - 10.3 mg/dL 8.4  8.4  9.1   Total Protein 6.5 - 8.1 g/dL 7.0  6.8  7.4   Total Bilirubin 0.3 - 1.2 mg/dL 0.5  0.7  0.6   Alkaline Phos 38 - 126 U/L 79  76  77   AST 15 - 41 U/L _2 ALT 0 - 44 U/L _3 RADIOGRAPHIC STUDIES: I have personally reviewed the radiological images as listed and agreed with the findings in the report. MM DIAG BREAST TOMO UNI RIGHT  Result Date: 07/06/2022 CLINICAL DATA:  Probably benign calcifications at the patient's lumpectomy site on the right. Status post right lumpectomy for breast cancer in August 2022 followed by radiation therapy. EXAM: DIGITAL DIAGNOSTIC UNILATERAL RIGHT MAMMOGRAM WITH TOMOSYNTHESIS TECHNIQUE: Right digital diagnostic mammography and breast tomosynthesis was  performed. COMPARISON:  Previous exam(s). ACR Breast Density Category b: There are scattered areas of fibroglandular density. FINDINGS: Post lumpectomy changes are again demonstrated in the posterior upper outer right breast. There are small areas of fat necrosis and oil cyst formation at that location with associated dystrophic wall calcifications, including the previously described probably benign calcifications. Right breast secretory calcifications are stable. No findings suspicious for malignancy. IMPRESSION: 1. No evidence of malignancy. 2. The previously described probably benign right breast calcifications are benign calcifications associated with fat necrosis and oil cyst formation. RECOMMENDATION: Bilateral diagnostic mammogram in June 2024 when due. I have discussed the findings and recommendations with the patient. If applicable, a reminder letter will be sent to the patient regarding the next appointment. BI-RADS CATEGORY  2: Benign. Electronically Signed   By: Claudie Revering M.D.   On: 07/06/2022 11:59  Exercise Tolerance Test  Result Date: 06/14/2022   No ST deviation was noted.   Prior study not available for comparison. Inconclusive stress test due to lack of heart rate augmentation.  Hypertensive exercise response. No ST changes or ectopy noted during study.

## 2022-07-06 NOTE — Assessment & Plan Note (Addendum)
#  Right breast invasive carcinoma pT1b pN0 ER negative, PR weakly positive HER 2 positive.  Patient does not tolerate adjuvant chemotherapy with Taxol and trastuzumab despite Taxol dose reduction.-->Status post adjuvant radiation--> maintenance Trastuzumab Labs are reviewed and discussed with patient. Proceed with trastuzumab maintenance. Nov 2023 Echo showed mild decrease [5%] of LVEF- monitor

## 2022-07-06 NOTE — Patient Instructions (Signed)
[facility]  Discharge Instructions: Thank you for choosing [facility] to provide your oncology and hematology care.  If you have a lab appointment with the Cancer Center, please go directly to the Cancer Center and check in at the registration area.  Wear comfortable clothing and clothing appropriate for easy access to any Portacath or PICC line.   We strive to give you quality time with your provider. You may need to reschedule your appointment if you arrive late (15 or more minutes).  Arriving late affects you and other patients whose appointments are after yours.  Also, if you miss three or more appointments without notifying the office, you may be dismissed from the clinic at the provider's discretion.      For prescription refill requests, have your pharmacy contact our office and allow 72 hours for refills to be completed.    To help prevent nausea and vomiting after your treatment, we encourage you to take your nausea medication as directed.  BELOW ARE SYMPTOMS THAT SHOULD BE REPORTED IMMEDIATELY: *FEVER GREATER THAN 100.4 F (38 C) OR HIGHER *CHILLS OR SWEATING *NAUSEA AND VOMITING THAT IS NOT CONTROLLED WITH YOUR NAUSEA MEDICATION *UNUSUAL SHORTNESS OF BREATH *UNUSUAL BRUISING OR BLEEDING *URINARY PROBLEMS (pain or burning when urinating, or frequent urination) *BOWEL PROBLEMS (unusual diarrhea, constipation, pain near the anus) TENDERNESS IN MOUTH AND THROAT WITH OR WITHOUT PRESENCE OF ULCERS (sore throat, sores in mouth, or a toothache) UNUSUAL RASH, SWELLING OR PAIN  UNUSUAL VAGINAL DISCHARGE OR ITCHING   Items with * indicate a potential emergency and should be followed up as soon as possible or go to the Emergency Department if any problems should occur.  Please show the CHEMOTHERAPY ALERT CARD or IMMUNOTHERAPY ALERT CARD at check-in to the Emergency Department and triage nurse.  Should you have questions after your visit or  need to cancel or reschedule your appointment, please contact [facility]  [phone] and follow the prompts.  Office hours are 8:00 a.m. to 4:30 p.m. Monday - Friday. Please note that voicemails left after 4:00 p.m. may not be returned until the following business day.  We are closed weekends and major holidays. You have access to a nurse at all times for urgent questions. Please call the main number to the clinic [phone] and follow the prompts.  For any non-urgent questions, you may also contact your provider using MyChart. We now offer e-Visits for anyone 18 and older to request care online for non-urgent symptoms. For details visit [email or web address].   Also download the MyChart app! Go to the app store, search "MyChart", open the app, select [facility], and log in with your MyChart username and password.    

## 2022-07-06 NOTE — Assessment & Plan Note (Signed)
She is on calcium 2000 mg daily.  Check vitamin D level.  IV calcium gluconate 2g x 1

## 2022-07-07 NOTE — Telephone Encounter (Signed)
Spoke to pt and she stated that se did not put in a request for a knee brace.

## 2022-07-09 ENCOUNTER — Other Ambulatory Visit: Payer: Self-pay | Admitting: Family

## 2022-07-09 ENCOUNTER — Other Ambulatory Visit: Payer: Self-pay | Admitting: Cardiovascular Disease

## 2022-07-09 DIAGNOSIS — F419 Anxiety disorder, unspecified: Secondary | ICD-10-CM

## 2022-07-12 ENCOUNTER — Other Ambulatory Visit: Payer: Self-pay | Admitting: Family

## 2022-07-12 DIAGNOSIS — F419 Anxiety disorder, unspecified: Secondary | ICD-10-CM

## 2022-07-12 MED ORDER — ALPRAZOLAM 0.25 MG PO TABS: 0.2500 mg | ORAL_TABLET | Freq: Two times a day (BID) | ORAL | 2 refills | Status: DC | PRN

## 2022-07-15 ENCOUNTER — Other Ambulatory Visit: Payer: Self-pay | Admitting: Family

## 2022-07-15 ENCOUNTER — Other Ambulatory Visit: Payer: Self-pay | Admitting: Cardiovascular Disease

## 2022-07-15 DIAGNOSIS — F419 Anxiety disorder, unspecified: Secondary | ICD-10-CM

## 2022-07-20 DIAGNOSIS — M542 Cervicalgia: Secondary | ICD-10-CM | POA: Diagnosis not present

## 2022-07-20 DIAGNOSIS — L578 Other skin changes due to chronic exposure to nonionizing radiation: Secondary | ICD-10-CM | POA: Diagnosis not present

## 2022-07-20 DIAGNOSIS — M9901 Segmental and somatic dysfunction of cervical region: Secondary | ICD-10-CM | POA: Diagnosis not present

## 2022-07-20 DIAGNOSIS — G43001 Migraine without aura, not intractable, with status migrainosus: Secondary | ICD-10-CM | POA: Diagnosis not present

## 2022-07-20 DIAGNOSIS — M9903 Segmental and somatic dysfunction of lumbar region: Secondary | ICD-10-CM | POA: Diagnosis not present

## 2022-07-20 DIAGNOSIS — L570 Actinic keratosis: Secondary | ICD-10-CM | POA: Diagnosis not present

## 2022-07-21 DIAGNOSIS — M9901 Segmental and somatic dysfunction of cervical region: Secondary | ICD-10-CM | POA: Diagnosis not present

## 2022-07-21 DIAGNOSIS — M542 Cervicalgia: Secondary | ICD-10-CM | POA: Diagnosis not present

## 2022-07-21 DIAGNOSIS — G43001 Migraine without aura, not intractable, with status migrainosus: Secondary | ICD-10-CM | POA: Diagnosis not present

## 2022-07-21 DIAGNOSIS — M9903 Segmental and somatic dysfunction of lumbar region: Secondary | ICD-10-CM | POA: Diagnosis not present

## 2022-07-25 DIAGNOSIS — M542 Cervicalgia: Secondary | ICD-10-CM | POA: Diagnosis not present

## 2022-07-25 DIAGNOSIS — M9903 Segmental and somatic dysfunction of lumbar region: Secondary | ICD-10-CM | POA: Diagnosis not present

## 2022-07-25 DIAGNOSIS — G43001 Migraine without aura, not intractable, with status migrainosus: Secondary | ICD-10-CM | POA: Diagnosis not present

## 2022-07-25 DIAGNOSIS — M9901 Segmental and somatic dysfunction of cervical region: Secondary | ICD-10-CM | POA: Diagnosis not present

## 2022-07-26 DIAGNOSIS — M9903 Segmental and somatic dysfunction of lumbar region: Secondary | ICD-10-CM | POA: Diagnosis not present

## 2022-07-26 DIAGNOSIS — M542 Cervicalgia: Secondary | ICD-10-CM | POA: Diagnosis not present

## 2022-07-26 DIAGNOSIS — M9901 Segmental and somatic dysfunction of cervical region: Secondary | ICD-10-CM | POA: Diagnosis not present

## 2022-07-26 DIAGNOSIS — G43001 Migraine without aura, not intractable, with status migrainosus: Secondary | ICD-10-CM | POA: Diagnosis not present

## 2022-07-27 ENCOUNTER — Inpatient Hospital Stay (HOSPITAL_BASED_OUTPATIENT_CLINIC_OR_DEPARTMENT_OTHER): Payer: Medicare Other | Admitting: Oncology

## 2022-07-27 ENCOUNTER — Encounter: Payer: Self-pay | Admitting: Oncology

## 2022-07-27 ENCOUNTER — Encounter: Payer: Self-pay | Admitting: *Deleted

## 2022-07-27 ENCOUNTER — Inpatient Hospital Stay: Payer: Medicare Other | Attending: Oncology

## 2022-07-27 ENCOUNTER — Inpatient Hospital Stay: Payer: Medicare Other

## 2022-07-27 VITALS — BP 155/112 | HR 95

## 2022-07-27 VITALS — BP 134/100 | HR 83 | Temp 96.0°F | Wt 258.4 lb

## 2022-07-27 DIAGNOSIS — G43001 Migraine without aura, not intractable, with status migrainosus: Secondary | ICD-10-CM | POA: Diagnosis not present

## 2022-07-27 DIAGNOSIS — C50411 Malignant neoplasm of upper-outer quadrant of right female breast: Secondary | ICD-10-CM

## 2022-07-27 DIAGNOSIS — Z171 Estrogen receptor negative status [ER-]: Secondary | ICD-10-CM | POA: Diagnosis not present

## 2022-07-27 DIAGNOSIS — G629 Polyneuropathy, unspecified: Secondary | ICD-10-CM | POA: Insufficient documentation

## 2022-07-27 DIAGNOSIS — C50919 Malignant neoplasm of unspecified site of unspecified female breast: Secondary | ICD-10-CM | POA: Diagnosis not present

## 2022-07-27 DIAGNOSIS — Z5112 Encounter for antineoplastic immunotherapy: Secondary | ICD-10-CM

## 2022-07-27 DIAGNOSIS — M542 Cervicalgia: Secondary | ICD-10-CM | POA: Diagnosis not present

## 2022-07-27 DIAGNOSIS — M9901 Segmental and somatic dysfunction of cervical region: Secondary | ICD-10-CM | POA: Diagnosis not present

## 2022-07-27 DIAGNOSIS — M9903 Segmental and somatic dysfunction of lumbar region: Secondary | ICD-10-CM | POA: Diagnosis not present

## 2022-07-27 LAB — CBC WITH DIFFERENTIAL/PLATELET
Abs Immature Granulocytes: 0.02 10*3/uL (ref 0.00–0.07)
Basophils Absolute: 0 10*3/uL (ref 0.0–0.1)
Basophils Relative: 0 %
Eosinophils Absolute: 0.1 10*3/uL (ref 0.0–0.5)
Eosinophils Relative: 1 %
HCT: 43.5 % (ref 36.0–46.0)
Hemoglobin: 14.4 g/dL (ref 12.0–15.0)
Immature Granulocytes: 0 %
Lymphocytes Relative: 26 %
Lymphs Abs: 1.8 10*3/uL (ref 0.7–4.0)
MCH: 30.3 pg (ref 26.0–34.0)
MCHC: 33.1 g/dL (ref 30.0–36.0)
MCV: 91.4 fL (ref 80.0–100.0)
Monocytes Absolute: 0.4 10*3/uL (ref 0.1–1.0)
Monocytes Relative: 6 %
Neutro Abs: 4.7 10*3/uL (ref 1.7–7.7)
Neutrophils Relative %: 67 %
Platelets: 189 10*3/uL (ref 150–400)
RBC: 4.76 MIL/uL (ref 3.87–5.11)
RDW: 13 % (ref 11.5–15.5)
WBC: 7 10*3/uL (ref 4.0–10.5)
nRBC: 0 % (ref 0.0–0.2)

## 2022-07-27 LAB — COMPREHENSIVE METABOLIC PANEL
ALT: 18 U/L (ref 0–44)
AST: 25 U/L (ref 15–41)
Albumin: 4 g/dL (ref 3.5–5.0)
Alkaline Phosphatase: 84 U/L (ref 38–126)
Anion gap: 9 (ref 5–15)
BUN: 19 mg/dL (ref 8–23)
CO2: 30 mmol/L (ref 22–32)
Calcium: 8.9 mg/dL (ref 8.9–10.3)
Chloride: 100 mmol/L (ref 98–111)
Creatinine, Ser: 0.87 mg/dL (ref 0.44–1.00)
GFR, Estimated: >60 mL/min (ref >60)
Glucose, Bld: 112 mg/dL — ABNORMAL HIGH (ref 70–99)
Potassium: 3.7 mmol/L (ref 3.5–5.1)
Sodium: 139 mmol/L (ref 135–145)
Total Bilirubin: 0.6 mg/dL (ref 0.3–1.2)
Total Protein: 7.7 g/dL (ref 6.5–8.1)

## 2022-07-27 MED ORDER — HEPARIN SOD (PORK) LOCK FLUSH 100 UNIT/ML IV SOLN
500.0000 [IU] | Freq: Once | INTRAVENOUS | Status: AC | PRN
  Filled 2022-07-27: qty 5

## 2022-07-27 MED ORDER — HEPARIN SOD (PORK) LOCK FLUSH 100 UNIT/ML IV SOLN
INTRAVENOUS | Status: AC
  Administered 2022-07-27: 500 [IU]
  Filled 2022-07-27: qty 5

## 2022-07-27 MED ORDER — ACETAMINOPHEN 325 MG PO TABS
650.0000 mg | ORAL_TABLET | Freq: Once | ORAL | Status: AC
  Administered 2022-07-27: 650 mg via ORAL
  Filled 2022-07-27: qty 2

## 2022-07-27 MED ORDER — TRASTUZUMAB-ANNS CHEMO 150 MG IV SOLR
6.0000 mg/kg | Freq: Once | INTRAVENOUS | Status: AC
  Administered 2022-07-27: 693 mg via INTRAVENOUS
  Filled 2022-07-27: qty 14.29

## 2022-07-27 MED ORDER — DIPHENHYDRAMINE HCL 25 MG PO CAPS
50.0000 mg | ORAL_CAPSULE | Freq: Once | ORAL | Status: AC
  Administered 2022-07-27: 50 mg via ORAL
  Filled 2022-07-27: qty 2

## 2022-07-27 MED ORDER — SODIUM CHLORIDE 0.9 % IV SOLN
Freq: Once | INTRAVENOUS | Status: AC
  Filled 2022-07-27: qty 250

## 2022-07-27 NOTE — Progress Notes (Signed)
Acupuncture approval faxed to [place]

## 2022-07-27 NOTE — Progress Notes (Signed)
1530: MD aware of B/P and patient's complaints of a headache. Pt denies any other symptoms at this time. Per Dr. Tasia Catchings okay to discharge pt home and pt to monitor b/p today and tomorrow. Pt educated to monitor b/p at home for today and tomorrow and  IF headache worsens or new symptoms begin pt to report to ER for evaluation, and if B/P remains elevated pt to contact PCP for further instructions. Pt verbalizes understanding. Pt stable at discharge.

## 2022-07-27 NOTE — Assessment & Plan Note (Addendum)
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  She may increase dose of 200 mg at bedtime.  Acupuncture clinic referal

## 2022-07-27 NOTE — Assessment & Plan Note (Addendum)
#  Right breast invasive carcinoma pT1b pN0 ER negative, PR weakly positive HER 2 positive.  Not on endocrine therapy due to unclear magnitude of benefit Patient does not tolerate adjuvant chemotherapy with Taxol and trastuzumab despite Taxol dose reduction.-->Status post adjuvant radiation--> maintenance Trastuzumab Labs are reviewed and discussed with patient. Proceed with trastuzumab- last treatment.  Nov 2023 Echo showed mild decrease [5%] of LVEF Bilateral diagnostic mammogram in June 2024

## 2022-07-27 NOTE — Assessment & Plan Note (Signed)
Treatment plan as needed.

## 2022-07-27 NOTE — Assessment & Plan Note (Signed)
She is on calcium 2000 mg daily.

## 2022-07-27 NOTE — Assessment & Plan Note (Signed)
Patient is on Eliquis 5 mg twice daily. On Flecainide. Follow-up with cardiology. -she is going to establish care with EP

## 2022-07-27 NOTE — Progress Notes (Signed)
Hematology/Oncology Progress note Telephone:(336) [phone] Fax:(336) [phone]      ASSESSMENT & PLAN:   Cancer Staging  Malignant neoplasm of upper-outer quadrant of right female breast [facility]) Staging form: Breast, AJCC 8th Edition - Pathologic stage from 03/03/2021: Stage IA (pT1b, pN0, cM0, G3, ER-, PR+, HER2+) - Signed by [name], MD on 03/03/2021   Malignant neoplasm of upper-outer quadrant of right female breast [facility]) #Right breast invasive carcinoma pT1b pN0 ER negative, PR weakly positive HER 2 positive.  Not on endocrine therapy due to unclear magnitude of benefit Patient does not tolerate adjuvant chemotherapy with Taxol and trastuzumab despite Taxol dose reduction.-->Status post adjuvant radiation--> maintenance Trastuzumab Labs are reviewed and discussed with patient. Proceed with trastuzumab- last treatment.  Nov 2023 Echo showed mild decrease [5%] of LVEF Bilateral diagnostic mammogram in June 2024    Neuropathy Pre-existing neuropathy in her fingertips bilaterally- secondary to cervical spine radiculopathy continue 100 mg 3 times daily.  She may increase dose of 200 mg at bedtime.  Acupuncture clinic referal  Atrial fibrillation [facility]) Patient is on Eliquis 5 mg twice daily. On Flecainide. Follow-up with cardiology. -she is going to establish care with EP  Encounter for monoclonal antibody treatment for malignancy Treatment plan as needed.   Hypocalcemia She is on calcium 2000 mg daily.    Follow up   3 months. All questions were answered. The patient knows to call the clinic with any problems, questions or concerns.  [name], MD, PhD [facility] Hematology Oncology 07/27/2022   CHIEF COMPLAINTS/REASON FOR VISIT:  Follow-up for stage I HER2 positive breast cancer  HISTORY OF PRESENTING ILLNESS:   Amber Barnes is a  72 y.o.  female presents for follow up of right breast cancer.  Oncology History  Malignant neoplasm of upper-outer quadrant of  right female breast ([place])  01/19/2021 Initial Diagnosis   Breast cancer in female  -12/30/2020, patient had a screening mammogram done which showed possible mass and asymmetry in the right breast. 01/08/2021, diagnostic mammogram of the right breast showed 2 adjacent irregular spiculated masses.  Larger 1 was 10 mm and a smaller mass measured 5 mm. 01/15/2021 stereotactic biopsy of the right breast smaller mass showed invasive carcinoma no special type, grade 2, DCIS present, LVI negative.Ultrasound guided biopsy of the larger mass showed invasive mammary carcinoma  02/17/2021, patient is status post lumpectomy and sentinel lymph node biopsy Pathology showed invasive mammary carcinoma, 2 separate foci, DCIS, negative sentinel lymph node biopsy 0/1, grade 3, or margins negative for invasive carcinoma.  ER -, PR + [1-10%], HER2 +  pT1b(m) pN0   Strong family history of cancer, Discussed again about genetic testing. patient adamantly declined.   03/03/2021 Cancer Staging   Staging form: Breast, AJCC 8th Edition - Pathologic stage from 03/03/2021: Stage IA (pT1b, pN0, cM0, G3, ER-, PR+, HER2+) - Signed by [name], MD on 03/03/2021 Stage prefix: Initial diagnosis Multigene prognostic tests performed: None Histologic grading system: 3 grade system   03/08/2021 Echocardiogram   echocardiogram showed LVEF 60-65%. Per patient, she has been seen by Dr. [name] team and was cleared for proceeding with chemotherapy.    02/2021 Genetic Testing   Strong family history of cancer, Discussed again about genetic testing. patient adamantly declined.   03/23/2021 Procedure   Mediport by vascular surgeon   03/24/2021 -  Chemotherapy   03/24/2021 - 03/31/2021 Taxol 80 mg/m2 [name] 04/05/2021 - 04/09/2021 patient was admitted due to neutropenic fever with associated SIRS.  Patient was treated  with empiric cefepime and vancomycin until date of discharge when Justice recovered to 1.4. Post hospitalization, patient also  had COVID-19 infection so her follow-up appointment was further postponed for another 2 weeks.  05/10/2021 resumed dose reduce Taxol '65mg'$ /m2 [name] 05/25/2021-06/30/2021, cycle 2 dose reduced Taxol '65mg'$ /m2 /trastuzumab   admitted from 07/12/2021 - 07/17/2021 due to acute pyelonephritis UVJ obstruction and a right hydro ureteralnephrosis.  Patient was treated with IV antibiotics status post emergent bilateral ureteral stent placement.  Discharged home with a course of oral antibiotics.  Shared decision made to hold chemotherapy and proceed with radiation.    08/18/2021 - 09/08/2021 Radiation Therapy    adjuvant breast radiation   10/06/2021 -  Chemotherapy   10/06/2021, shared decision was made to proceed with maintenance trastuzumab.  Her cardiologist Dr. [name] gave clearance to proceed. Plan Transtuzumab for up to a year if tolerates.    12/14/2021 Mammogram   Bilateral diagnostic mammogram 1. Probably benign right breast calcifications along the posterior lumpectomy bed felt to represent early changes of fat necrosis.Recommend short-term imaging follow-up. 2. No mammographic evidence of malignancy on the left.   03/02/2022 Echocardiogram   LVEF 95-09%, grade I diastolic dysfuction   32/67/1245 Echocardiogram   LVEF is 55 to 60%, Grade II diastolic dysfunction    80/99/8338 Mammogram   Unilateral diagnostic mammogram -right 1. No evidence of malignancy. 2. The previously described probably benign right breast calcifications are benign calcifications associated with fat necrosis and oil cyst formation.   HER2-positive carcinoma of breast ([place])    Left butt/thigh tenderness for a few weeks, ultrasound soft tissue left lower extremity negative.   INTERVAL HISTORY Amber Barnes is a 72 y.o. female who has above history reviewed by me today presents for follow up visit for management of right side HER 2 positive breast cancer She feels well today. No new complaints.    Review of  Systems  Constitutional:  Positive for fatigue. Negative for appetite change, chills, fever and unexpected weight change.  HENT:   Negative for hearing loss and voice change.   Respiratory:  Negative for chest tightness and cough.   Cardiovascular:  Negative for chest pain and leg swelling.  Gastrointestinal:  Negative for abdominal distention, abdominal pain, blood in stool, nausea and vomiting.  Endocrine: Negative for hot flashes.  Genitourinary:  Negative for difficulty urinating, frequency and hematuria.   Musculoskeletal:  Positive for arthralgias.  Skin:  Negative for itching and rash.  Neurological:  Positive for numbness. Negative for dizziness and extremity weakness.  Hematological:  Negative for adenopathy.  Psychiatric/Behavioral:  Negative for confusion.     MEDICAL HISTORY:  Past Medical History:  Diagnosis Date   Achilles tendinitis    Acute medial meniscal tear    Allergy    Anxiety    Atrial fibrillation ([place])    a. new onset 07/2016; b. CHADS2VASc --> 4 (HTN, female, age, CHF); c. eliquis   CHF (congestive heart failure) (HCC)    Chronic anticoagulation    Apixaban   Chronic lower back pain    Colon polyp    Complication of anesthesia    Emergence delirium; hypoxia   COPD (chronic obstructive pulmonary disease) ([place])    Cystic breast    Depression    Endometriosis    Fibrocystic breast disease    GERD (gastroesophageal reflux disease)    History of stress test    a. 07/2016 MV: EF 55-65%. Small, mild defect  in mid anteroseptal, apical septal, and apical locations.  No ischemia-->low risk.   Hyperlipidemia    Hypertension    Insomnia    Lipoma    Malignant neoplasm of upper-outer quadrant of right female breast (HCC)    Stage 1A invasive mammary carcinoma (cT1b or T1c N0); ER (-), PR (+), HER2/neu (+)   Migraine    Mild Mitral regurgitation    a. 07/2016 Echo: EF 55-65%, no rwma, mild MR. Mildly dil LA. Nl RV fx.    Nephrolithiasis    OSA (obstructive  sleep apnea)    non-compliant with nocturnal PAP therapy   Osteoarthritis    left knee   Osteopenia    Peptic ulcer disease    a. H. pylori   Pneumonia    RLS (restless legs syndrome)    Tendonitis    Thyroid disease    Vitamin D deficiency     SURGICAL HISTORY: Past Surgical History:  Procedure Laterality Date   ABDOMINAL HYSTERECTOMY     ovaries left   ACHILLES TENDON SURGERY     2012,01/2017   BACK SURGERY     fusion lumbar   BREAST BIOPSY Right 01/15/2021   Affirm biopsy-"X" clip-INVASIVE MAMMARY CARCINOMA   BREAST BIOPSY Right 01/15/2021   u/s bx-"venus" clip INVASIVE MAMMARY CARCINOMA   BREAST BIOPSY     BREAST LUMPECTOMY,RADIO FREQ LOCALIZER,AXILLARY SENTINEL LYMPH NODE BIOPSY Right 02/17/2021   Procedure: BREAST LUMPECTOMY,RADIO FREQ LOCALIZER,AXILLARY SENTINEL LYMPH NODE BIOPSY;  Surgeon: Ronny Bacon, MD;  Location: [facility];  Service: General;  Laterality: Right;   CARDIOVERSION N/A 09/07/2016   Procedure: CARDIOVERSION;  Surgeon: Minna Merritts, MD;  Location: [facility];  Service: Cardiovascular;  Laterality: N/A;   CARDIOVERSION N/A 10/19/2016   Procedure: CARDIOVERSION;  Surgeon: Minna Merritts, MD;  Location: [facility];  Service: Cardiovascular;  Laterality: N/A;   CARDIOVERSION N/A 12/14/2016   Procedure: CARDIOVERSION;  Surgeon: Minna Merritts, MD;  Location: [facility];  Service: Cardiovascular;  Laterality: N/A;   COLONOSCOPY     COLONOSCOPY WITH PROPOFOL N/A 05/13/2020   Procedure: COLONOSCOPY WITH PROPOFOL;  Surgeon: Toledo, Benay Pike, MD;  Location: [facility];  Service: Gastroenterology;  Laterality: N/A;  ELIQUIS   CYSTOSCOPY WITH STENT PLACEMENT Bilateral 07/12/2021   Procedure: CYSTOSCOPY WITH STENT PLACEMENT;  Surgeon: Billey Co, MD;  Location: [facility];  Service: Urology;  Laterality: Bilateral;   CYSTOSCOPY/URETEROSCOPY/HOLMIUM LASER/STENT PLACEMENT Bilateral 07/30/2021   Procedure: CYSTOSCOPY/URETEROSCOPY/HOLMIUM LASER/BILATERAL  STENT REMOVAL;  Surgeon: Billey Co, MD;  Location: [facility];  Service: Urology;  Laterality: Bilateral;   ESOPHAGOGASTRODUODENOSCOPY     ESOPHAGOGASTRODUODENOSCOPY (EGD) WITH PROPOFOL N/A 05/13/2020   Procedure: ESOPHAGOGASTRODUODENOSCOPY (EGD) WITH PROPOFOL;  Surgeon: Toledo, Benay Pike, MD;  Location: [facility];  Service: Gastroenterology;  Laterality: N/A;   FOOT SURGERY Left    tendon   JOINT REPLACEMENT Right    hip   PORTA CATH INSERTION N/A 03/23/2021   Procedure: PORTA CATH INSERTION;  Surgeon: Katha Cabal, MD;  Location: [facility];  Service: Cardiovascular;  Laterality: N/A;   PORTACATH PLACEMENT N/A 03/23/2021   Procedure: ATTEMPTED INSERTION PORT-A-CATH;  Surgeon: Ronny Bacon, MD;  Location: [facility];  Service: General;  Laterality: N/A;   THYROIDECTOMY     thyroid lobectomy secondary to a benign nodule   TOTAL HIP ARTHROPLASTY Right 08/21/2018   Procedure: TOTAL HIP ARTHROPLASTY ANTERIOR APPROACH-RIGHT CELL SAVER REQUESTED;  Surgeon: [name], MD;  Location: [facility];  Service: Orthopedics;  Laterality: Right;    SOCIAL HISTORY: Social History   Socioeconomic  History   Marital status: Married    Spouse name: jeffrey   Number of children: 1   Years of education: Not on file   Highest education level: High school graduate  Occupational History    Comment: disabled  Tobacco Use   Smoking status: Former    Packs/day: 0.25    Years: 10.00    Total pack years: 2.50    Types: Cigarettes    Quit date: 2005    Years since quitting: 19.0   Smokeless tobacco: Never   Tobacco comments:    smoked for 10 years, 1 pack every 2-3 days  Vaping Use   Vaping Use: Never used  Substance and Sexual Activity   Alcohol use: Not Currently    Alcohol/week: 1.0 standard drink of alcohol    Types: 1 Glasses of wine per week    Comment: rarely   Drug use: No   Sexual activity: Not Currently  Other Topics Concern   Not on file  Social History  Narrative   Moved to [place] from [place], originally from [place].    1 cup of caffeine daily   Right handed   Lives with husband, step-daughter and child       Social Determinants of Health   Financial Resource Strain: Low Risk  (12/09/2021)   Overall Financial Resource Strain (CARDIA)    Difficulty of Paying Living Expenses: Not very hard  Food Insecurity: No Food Insecurity (12/09/2021)   Hunger Vital Sign    Worried About Running Out of Food in the Last Year: Never true    Ran Out of Food in the Last Year: Never true  Transportation Needs: No Transportation Needs (12/09/2021)   PRAPARE - Hydrologist (Medical): No    Lack of Transportation (Non-Medical): No  Physical Activity: Insufficiently Active (12/09/2021)   Exercise Vital Sign    Days of Exercise per Week: 7 days    Minutes of Exercise per Session: 20 min  Stress: Stress Concern Present (12/09/2021)   [place]    Feeling of Stress : Very much  Social Connections: Moderately Isolated (12/09/2021)   Social Connection and Isolation Panel [NHANES]    Frequency of Communication with Friends and Family: Never    Frequency of Social Gatherings with Friends and Family: Once a week    Attends Religious Services: More than 4 times per year    Active Member of [employer] or Organizations: No    Attends Archivist Meetings: Never    Marital Status: Married  Human resources officer Violence: Not At Risk (12/09/2021)   Humiliation, Afraid, Rape, and Kick questionnaire    Fear of Current or Ex-Partner: No    Emotionally Abused: No    Physically Abused: No    Sexually Abused: No    FAMILY HISTORY: Family History  Problem Relation Age of Onset   Cancer Mother        leukemia   Cancer Father 11       colon   Heart disease Father    Heart attack Father 63       died from MI   Hyperlipidemia Father    Hypertension Father     Cancer Sister 47       ovarian   Depression Sister    Cancer Brother        CLL   Breast cancer Paternal Aunt  40's   Migraines Neg Hx    Thyroid cancer Neg Hx     ALLERGIES:  is allergic to prednisone, amiodarone, hydrocodone-acetaminophen, oxycodone, tapentadol, tramadol, adhesive [tape], and latex.  MEDICATIONS:  Current Outpatient Medications  Medication Sig Dispense Refill   acetaminophen (TYLENOL) 500 MG tablet Take 500-1,000 mg by mouth every 6 (six) hours as needed for mild pain or moderate pain.     ALPRAZolam (XANAX) 0.25 MG tablet Take 1 tablet (0.25 mg total) by mouth 2 (two) times daily as needed for anxiety. 60 tablet 2   apixaban (ELIQUIS) 5 MG TABS tablet TAKE 1 TABLET BY MOUTH TWICE A DAY 180 tablet 1   bisoprolol (ZEBETA) 10 MG tablet Take 1 tablet (10 mg total) by mouth 2 (two) times daily. 120 tablet 3   busPIRone (BUSPAR) 5 MG tablet TAKE 1 TABLET BY MOUTH THREE TIMES A DAY AS NEEDED 60 tablet 1   Cholecalciferol (VITAMIN D3) 5000 units CAPS Take 5,000 Units by mouth daily.     clotrimazole (LOTRIMIN) 1 % cream Apply 1 application topically 2 (two) times daily. 30 g 0   diltiazem (CARDIZEM CD) 240 MG 24 hr capsule Take 240 mg by mouth as needed.     diphenhydrAMINE (BENADRYL) 25 mg capsule Take 1 capsule (25 mg total) by mouth every 8 (eight) hours as needed for itching or allergies. 30 capsule 0   esomeprazole (NEXIUM) 20 MG capsule Take 20 mg by mouth daily as needed (GERD symptoms).     flecainide (TAMBOCOR) 100 MG tablet Take 1 tablet (100 mg total) by mouth 2 (two) times daily. 180 tablet 3   FLUoxetine (PROZAC) 20 MG capsule Take 1 capsule (20 mg total) by mouth every morning. 90 capsule 3   furosemide (LASIX) 20 MG tablet TAKE 1 TABLET BY MOUTH TWICE A DAY 180 tablet 0   gabapentin (NEURONTIN) 100 MG capsule TAKE 1 CAPSULE (100 MG TOTAL) BY MOUTH 3 TIMES A DAY 270 capsule 0   KLOR-CON M20 20 MEQ tablet TAKE 1 TABLET BY MOUTH EVERY DAY 90 tablet 1    meclizine (ANTIVERT) 12.5 MG tablet Take 1 tablet (12.5 mg total) by mouth 3 (three) times daily as needed for dizziness. 30 tablet 0   Multiple Vitamin (MULTIVITAMIN) tablet Take 1 tablet by mouth daily.     nystatin (MYCOSTATIN/NYSTOP) powder Apply 1 application. topically 3 (three) times daily. 60 g 11   nystatin cream (MYCOSTATIN) Apply 1 application. topically 2 (two) times daily. Prn abdomen 60 g 5   ondansetron (ZOFRAN) 8 MG tablet TAKE 1 TABLET BY MOUTH EVERY 8 HOURS AS NEEDED FOR NAUSEA OR VOMITING. 60 tablet 0   promethazine (PHENERGAN) 25 MG tablet Take 1 tablet (25 mg total) by mouth every 6 (six) hours as needed for nausea or vomiting. 60 tablet 0   ranolazine (RANEXA) 500 MG 12 hr tablet TAKE 1 TABLET BY MOUTH TWICE A DAY 180 tablet 2   rizatriptan (MAXALT) 10 MG tablet TAKE 1 TABLET (10 MG TOTAL) BY MOUTH AS NEEDED FOR MIGRAINE. MAY REPEAT IN 2 HOURS IF NEEDED 10 tablet 0   rOPINIRole (REQUIP) 2 MG tablet TAKE 1 TABLET BY MOUTH EVERYDAY AT BEDTIME 90 tablet 1   rosuvastatin (CRESTOR) 20 MG tablet TAKE 1 TABLET BY MOUTH EVERY DAY 90 tablet 1   rosuvastatin (CRESTOR) 40 MG tablet Take 1 tablet (40 mg total) by mouth daily. 90 tablet 3   No current facility-administered medications for this visit.     PHYSICAL  EXAMINATION: ECOG PERFORMANCE STATUS: 1 - Symptomatic but completely ambulatory Vitals:   07/27/22 1305  BP: (!) 134/100  Pulse: 83  Temp: (!) 96 F (35.6 C)  SpO2: 97%    Filed Weights   07/27/22 1305  Weight: 258 lb 6.4 oz (117.2 kg)     Physical Exam Constitutional:      General: She is not in acute distress.    Appearance: She is obese.  HENT:     Head: Normocephalic and atraumatic.  Eyes:     General: No scleral icterus. Cardiovascular:     Rate and Rhythm: Normal rate. Rhythm irregular.     Heart sounds: Normal heart sounds.  Pulmonary:     Effort: Pulmonary effort is normal. No respiratory distress.     Breath sounds: No wheezing.  Abdominal:      General: Bowel sounds are normal. There is no distension.     Palpations: Abdomen is soft.  Musculoskeletal:        General: No deformity. Normal range of motion.     Cervical back: Normal range of motion and neck supple.     Comments: 1+ edema bilaterally in lower extremity Small left scapular soft tissue mass,   Skin:    General: Skin is warm and dry.     Findings: No erythema or rash.  Neurological:     Mental Status: She is alert and oriented to person, place, and time. Mental status is at baseline.     Cranial Nerves: No cranial nerve deficit.     Coordination: Coordination normal.  Psychiatric:        Mood and Affect: Mood normal.     . LABORATORY DATA:  I have reviewed the data as listed    Latest Ref Rng & Units 07/27/2022   12:50 PM 07/06/2022   12:37 PM 06/15/2022    1:47 PM  CBC  WBC 4.0 - 10.5 K/uL 7.0  7.8  5.6   Hemoglobin 12.0 - 15.0 g/dL 14.4  13.1  13.4   Hematocrit 36.0 - 46.0 % 43.5  39.5  39.6   Platelets 150 - 400 K/uL 189  175  164       Latest Ref Rng & Units 07/27/2022   12:50 PM 07/06/2022   12:37 PM 06/15/2022    1:47 PM  CMP  Glucose 70 - 99 mg/dL 112  114  87   BUN 8 - 23 mg/dL '19  15  19   '$ Creatinine 0.44 - 1.00 mg/dL 0.87  0.91  0.69   Sodium 135 - 145 mmol/L 139  141  139   Potassium 3.5 - 5.1 mmol/L 3.7  3.9  3.9   Chloride 98 - 111 mmol/L 100  104  104   CO2 22 - 32 mmol/L '30  27  27   '$ Calcium 8.9 - 10.3 mg/dL 8.9  8.4  8.4   Total Protein 6.5 - 8.1 g/dL 7.7  7.0  6.8   Total Bilirubin 0.3 - 1.2 mg/dL 0.6  0.5  0.7   Alkaline Phos 38 - 126 U/L 84  79  76   AST 15 - 41 U/L '25  21  22   '$ ALT 0 - 44 U/L '18  18  16       '$ RADIOGRAPHIC STUDIES: I have personally reviewed the radiological images as listed and agreed with the findings in the report. MM DIAG BREAST TOMO UNI RIGHT  Result Date: 07/06/2022 CLINICAL DATA:  Probably benign calcifications at  the patient's lumpectomy site on the right. Status post right lumpectomy for  breast cancer in August 2022 followed by radiation therapy. EXAM: DIGITAL DIAGNOSTIC UNILATERAL RIGHT MAMMOGRAM WITH TOMOSYNTHESIS TECHNIQUE: Right digital diagnostic mammography and breast tomosynthesis was performed. COMPARISON:  Previous exam(s). ACR Breast Density Category b: There are scattered areas of fibroglandular density. FINDINGS: Post lumpectomy changes are again demonstrated in the posterior upper outer right breast. There are small areas of fat necrosis and oil cyst formation at that location with associated dystrophic wall calcifications, including the previously described probably benign calcifications. Right breast secretory calcifications are stable. No findings suspicious for malignancy. IMPRESSION: 1. No evidence of malignancy. 2. The previously described probably benign right breast calcifications are benign calcifications associated with fat necrosis and oil cyst formation. RECOMMENDATION: Bilateral diagnostic mammogram in June 2024 when due. I have discussed the findings and recommendations with the patient. If applicable, a reminder letter will be sent to the patient regarding the next appointment. BI-RADS CATEGORY  2: Benign. Electronically Signed   By: Claudie Revering M.D.   On: 07/06/2022 11:59

## 2022-07-27 NOTE — Patient Instructions (Signed)
[facility] [place]  Discharge Instructions: Thank you for choosing [facility] to provide your oncology and hematology care.  If you have a lab appointment with the [facility], please go directly to the [facility] and check in at the registration area.  Wear comfortable clothing and clothing appropriate for easy access to any Portacath or PICC line.   We strive to give you quality time with your provider. You may need to reschedule your appointment if you arrive late (15 or more minutes).  Arriving late affects you and other patients whose appointments are after yours.  Also, if you miss three or more appointments without notifying the office, you may be dismissed from the clinic at the provider's discretion.      For prescription refill requests, have your pharmacy contact our office and allow 72 hours for refills to be completed.    Today you received the following chemotherapy and/or immunotherapy agents [place]i      To help prevent nausea and vomiting after your treatment, we encourage you to take your nausea medication as directed.  BELOW ARE SYMPTOMS THAT SHOULD BE REPORTED IMMEDIATELY: *FEVER GREATER THAN 100.4 F (38 C) OR HIGHER *CHILLS OR SWEATING *NAUSEA AND VOMITING THAT IS NOT CONTROLLED WITH YOUR NAUSEA MEDICATION *UNUSUAL SHORTNESS OF BREATH *UNUSUAL BRUISING OR BLEEDING *URINARY PROBLEMS (pain or burning when urinating, or frequent urination) *BOWEL PROBLEMS (unusual diarrhea, constipation, pain near the anus) TENDERNESS IN MOUTH AND THROAT WITH OR WITHOUT PRESENCE OF ULCERS (sore throat, sores in mouth, or a toothache) UNUSUAL RASH, SWELLING OR PAIN  UNUSUAL VAGINAL DISCHARGE OR ITCHING   Items with * indicate a potential emergency and should be followed up as soon as possible or go to the Emergency Department if any problems should occur.  Please show the CHEMOTHERAPY ALERT CARD or IMMUNOTHERAPY ALERT CARD at check-in to  the Emergency Department and triage nurse.  Should you have questions after your visit or need to cancel or reschedule your appointment, please contact [facility] [place] [facility] [place]  [phone] and follow the prompts.  Office hours are 8:00 a.m. to 4:30 p.m. Monday - Friday. Please note that voicemails left after 4:00 p.m. may not be returned until the following business day.  We are closed weekends and major holidays. You have access to a nurse at all times for urgent questions. Please call the main number to the clinic [phone] and follow the prompts.  For any non-urgent questions, you may also contact your provider using MyChart. We now offer e-Visits for anyone 46 and older to request care online for non-urgent symptoms. For details visit [email or web address].   Also download the MyChart app! Go to the app store, search "MyChart", open the app, select [facility], and log in with your MyChart username and password.

## 2022-07-28 ENCOUNTER — Ambulatory Visit: Payer: Medicare Other | Admitting: Nurse Practitioner

## 2022-07-28 ENCOUNTER — Encounter: Payer: Self-pay | Admitting: Nurse Practitioner

## 2022-07-28 VITALS — BP 162/92 | HR 96 | Temp 97.4°F | Ht 65.0 in | Wt 259.0 lb

## 2022-07-28 DIAGNOSIS — M542 Cervicalgia: Secondary | ICD-10-CM | POA: Diagnosis not present

## 2022-07-28 DIAGNOSIS — I10 Essential (primary) hypertension: Secondary | ICD-10-CM | POA: Diagnosis not present

## 2022-07-28 DIAGNOSIS — M9901 Segmental and somatic dysfunction of cervical region: Secondary | ICD-10-CM | POA: Diagnosis not present

## 2022-07-28 DIAGNOSIS — G43001 Migraine without aura, not intractable, with status migrainosus: Secondary | ICD-10-CM | POA: Diagnosis not present

## 2022-07-28 DIAGNOSIS — M9903 Segmental and somatic dysfunction of lumbar region: Secondary | ICD-10-CM | POA: Diagnosis not present

## 2022-07-28 MED ORDER — LOSARTAN POTASSIUM-HCTZ 50-12.5 MG PO TABS: ORAL_TABLET | ORAL | 1 refills | Status: DC

## 2022-07-28 NOTE — Progress Notes (Signed)
Established Patient Office Visit  Subjective:  Patient ID: Amber Barnes, female    DOB: 25-Jan-1951  Age: 72 y.o. MRN: [id number]  CC:  Chief Complaint  Patient presents with   Hypertension     HPI  Amber Barnes presents for elevated blood pressure. She state that the BP is running high since last 2 days. She states 160/112 last night.   Her BP was under control with BP medication from about past 1 year. She take Dilitaiazem as needed for tachycardia.   Her BP was high during Chemotherapy yesterday.  Have headache and some dizziness. Denies SOB, Chest pain.   Hypertension Associated symptoms include headaches and shortness of breath. Pertinent negatives include no chest pain or palpitations.     Past Medical History:  Diagnosis Date   Achilles tendinitis    Acute medial meniscal tear    Allergy    Anxiety    Atrial fibrillation ([place])    a. new onset 07/2016; b. CHADS2VASc --> 4 (HTN, female, age, CHF); c. eliquis   CHF (congestive heart failure) (HCC)    Chronic anticoagulation    Apixaban   Chronic lower back pain    Colon polyp    Complication of anesthesia    Emergence delirium; hypoxia   COPD (chronic obstructive pulmonary disease) ([place])    Cystic breast    Depression    Endometriosis    Fibrocystic breast disease    GERD (gastroesophageal reflux disease)    History of stress test    a. 07/2016 MV: EF 55-65%. Small, mild defect  in mid anteroseptal, apical septal, and apical locations. No ischemia-->low risk.   Hyperlipidemia    Hypertension    Insomnia    Lipoma    Malignant neoplasm of upper-outer quadrant of right female breast (HCC)    Stage 1A invasive mammary carcinoma (cT1b or T1c N0); ER (-), PR (+), HER2/neu (+)   Migraine    Mild Mitral regurgitation    a. 07/2016 Echo: EF 55-65%, no rwma, mild MR. Mildly dil LA. Nl RV fx.    Nephrolithiasis    OSA (obstructive sleep apnea)    non-compliant with nocturnal PAP therapy   Osteoarthritis     left knee   Osteopenia    Peptic ulcer disease    a. H. pylori   Pneumonia    RLS (restless legs syndrome)    Tendonitis    Thyroid disease    Vitamin D deficiency     Past Surgical History:  Procedure Laterality Date   ABDOMINAL HYSTERECTOMY     ovaries left   ACHILLES TENDON SURGERY     2012,01/2017   BACK SURGERY     fusion lumbar   BREAST BIOPSY Right 01/15/2021   Affirm biopsy-"X" clip-INVASIVE MAMMARY CARCINOMA   BREAST BIOPSY Right 01/15/2021   u/s bx-"venus" clip INVASIVE MAMMARY CARCINOMA   BREAST BIOPSY     BREAST LUMPECTOMY,RADIO FREQ LOCALIZER,AXILLARY SENTINEL LYMPH NODE BIOPSY Right 02/17/2021   Procedure: BREAST LUMPECTOMY,RADIO FREQ LOCALIZER,AXILLARY SENTINEL LYMPH NODE BIOPSY;  Surgeon: Ronny Bacon, MD;  Location: [facility];  Service: General;  Laterality: Right;   CARDIOVERSION N/A 09/07/2016   Procedure: CARDIOVERSION;  Surgeon: Minna Merritts, MD;  Location: [facility];  Service: Cardiovascular;  Laterality: N/A;   CARDIOVERSION N/A 10/19/2016   Procedure: CARDIOVERSION;  Surgeon: Minna Merritts, MD;  Location: [facility];  Service: Cardiovascular;  Laterality: N/A;   CARDIOVERSION N/A 12/14/2016   Procedure: CARDIOVERSION;  Surgeon: Minna Merritts, MD;  Location: [facility];  Service: Cardiovascular;  Laterality: N/A;   COLONOSCOPY     COLONOSCOPY WITH PROPOFOL N/A 05/13/2020   Procedure: COLONOSCOPY WITH PROPOFOL;  Surgeon: Toledo, Benay Pike, MD;  Location: [facility];  Service: Gastroenterology;  Laterality: N/A;  ELIQUIS   CYSTOSCOPY WITH STENT PLACEMENT Bilateral 07/12/2021   Procedure: CYSTOSCOPY WITH STENT PLACEMENT;  Surgeon: Billey Co, MD;  Location: [facility];  Service: Urology;  Laterality: Bilateral;   CYSTOSCOPY/URETEROSCOPY/HOLMIUM LASER/STENT PLACEMENT Bilateral 07/30/2021   Procedure: CYSTOSCOPY/URETEROSCOPY/HOLMIUM LASER/BILATERAL STENT REMOVAL;  Surgeon: Billey Co, MD;  Location: [facility];  Service: Urology;   Laterality: Bilateral;   ESOPHAGOGASTRODUODENOSCOPY     ESOPHAGOGASTRODUODENOSCOPY (EGD) WITH PROPOFOL N/A 05/13/2020   Procedure: ESOPHAGOGASTRODUODENOSCOPY (EGD) WITH PROPOFOL;  Surgeon: Toledo, Benay Pike, MD;  Location: [facility];  Service: Gastroenterology;  Laterality: N/A;   FOOT SURGERY Left    tendon   JOINT REPLACEMENT Right    hip   PORTA CATH INSERTION N/A 03/23/2021   Procedure: PORTA CATH INSERTION;  Surgeon: Katha Cabal, MD;  Location: [facility];  Service: Cardiovascular;  Laterality: N/A;   PORTACATH PLACEMENT N/A 03/23/2021   Procedure: ATTEMPTED INSERTION PORT-A-CATH;  Surgeon: Ronny Bacon, MD;  Location: [facility];  Service: General;  Laterality: N/A;   THYROIDECTOMY     thyroid lobectomy secondary to a benign nodule   TOTAL HIP ARTHROPLASTY Right 08/21/2018   Procedure: TOTAL HIP ARTHROPLASTY ANTERIOR APPROACH-RIGHT CELL SAVER REQUESTED;  Surgeon: [name], MD;  Location: [facility];  Service: Orthopedics;  Laterality: Right;    Family History  Problem Relation Age of Onset   Cancer Mother        leukemia   Cancer Father 39       colon   Heart disease Father    Heart attack Father 73       died from MI   Hyperlipidemia Father    Hypertension Father    Cancer Sister 25       ovarian   Depression Sister    Cancer Brother        CLL   Breast cancer Paternal Aunt        27's   Migraines Neg Hx    Thyroid cancer Neg Hx     Social History   Socioeconomic History   Marital status: Married    Spouse name: jeffrey   Number of children: 1   Years of education: Not on file   Highest education level: High school graduate  Occupational History    Comment: disabled  Tobacco Use   Smoking status: Former    Packs/day: 0.25    Years: 10.00    Total pack years: 2.50    Types: Cigarettes    Quit date: 2005    Years since quitting: 19.0   Smokeless tobacco: Never   Tobacco comments:    smoked for 10 years, 1 pack every 2-3 days   Vaping Use   Vaping Use: Never used  Substance and Sexual Activity   Alcohol use: Not Currently    Alcohol/week: 1.0 standard drink of alcohol    Types: 1 Glasses of wine per week    Comment: rarely   Drug use: No   Sexual activity: Not Currently  Other Topics Concern   Not on file  Social History Narrative   Moved to [place] from [place], originally from [place].    1 cup of caffeine daily   Right handed   Lives with  husband, step-daughter and child       Social Determinants of Health   Financial Resource Strain: Low Risk  (12/09/2021)   Overall Financial Resource Strain (CARDIA)    Difficulty of Paying Living Expenses: Not very hard  Food Insecurity: No Food Insecurity (12/09/2021)   Hunger Vital Sign    Worried About Running Out of Food in the Last Year: Never true    Ran Out of Food in the Last Year: Never true  Transportation Needs: No Transportation Needs (12/09/2021)   PRAPARE - Hydrologist (Medical): No    Lack of Transportation (Non-Medical): No  Physical Activity: Insufficiently Active (12/09/2021)   Exercise Vital Sign    Days of Exercise per Week: 7 days    Minutes of Exercise per Session: 20 min  Stress: Stress Concern Present (12/09/2021)   [place]    Feeling of Stress : Very much  Social Connections: Moderately Isolated (12/09/2021)   Social Connection and Isolation Panel [NHANES]    Frequency of Communication with Friends and Family: Never    Frequency of Social Gatherings with Friends and Family: Once a week    Attends Religious Services: More than 4 times per year    Active Member of [employer] or Organizations: No    Attends Archivist Meetings: Never    Marital Status: Married  Human resources officer Violence: Not At Risk (12/09/2021)   Humiliation, Afraid, Rape, and Kick questionnaire    Fear of Current or Ex-Partner: No    Emotionally  Abused: No    Physically Abused: No    Sexually Abused: No     Outpatient Medications Prior to Visit  Medication Sig Dispense Refill   acetaminophen (TYLENOL) 500 MG tablet Take 500-1,000 mg by mouth every 6 (six) hours as needed for mild pain or moderate pain.     ALPRAZolam (XANAX) 0.25 MG tablet Take 1 tablet (0.25 mg total) by mouth 2 (two) times daily as needed for anxiety. 60 tablet 2   apixaban (ELIQUIS) 5 MG TABS tablet TAKE 1 TABLET BY MOUTH TWICE A DAY 180 tablet 1   bisoprolol (ZEBETA) 10 MG tablet Take 1 tablet (10 mg total) by mouth 2 (two) times daily. 120 tablet 3   busPIRone (BUSPAR) 5 MG tablet TAKE 1 TABLET BY MOUTH THREE TIMES A DAY AS NEEDED 60 tablet 1   Cholecalciferol (VITAMIN D3) 5000 units CAPS Take 5,000 Units by mouth daily.     clotrimazole (LOTRIMIN) 1 % cream Apply 1 application topically 2 (two) times daily. 30 g 0   diltiazem (CARDIZEM CD) 240 MG 24 hr capsule Take 240 mg by mouth as needed.     diphenhydrAMINE (BENADRYL) 25 mg capsule Take 1 capsule (25 mg total) by mouth every 8 (eight) hours as needed for itching or allergies. 30 capsule 0   esomeprazole (NEXIUM) 20 MG capsule Take 20 mg by mouth daily as needed (GERD symptoms).     flecainide (TAMBOCOR) 100 MG tablet Take 1 tablet (100 mg total) by mouth 2 (two) times daily. 180 tablet 3   FLUoxetine (PROZAC) 20 MG capsule Take 1 capsule (20 mg total) by mouth every morning. 90 capsule 3   furosemide (LASIX) 20 MG tablet TAKE 1 TABLET BY MOUTH TWICE A DAY 180 tablet 0   gabapentin (NEURONTIN) 100 MG capsule TAKE 1 CAPSULE (100 MG TOTAL) BY MOUTH 3 TIMES A DAY 270 capsule 0   KLOR-CON M20  20 MEQ tablet TAKE 1 TABLET BY MOUTH EVERY DAY 90 tablet 1   meclizine (ANTIVERT) 12.5 MG tablet Take 1 tablet (12.5 mg total) by mouth 3 (three) times daily as needed for dizziness. 30 tablet 0   Multiple Vitamin (MULTIVITAMIN) tablet Take 1 tablet by mouth daily.     nystatin (MYCOSTATIN/NYSTOP) powder Apply 1  application. topically 3 (three) times daily. 60 g 11   nystatin cream (MYCOSTATIN) Apply 1 application. topically 2 (two) times daily. Prn abdomen 60 g 5   ondansetron (ZOFRAN) 8 MG tablet TAKE 1 TABLET BY MOUTH EVERY 8 HOURS AS NEEDED FOR NAUSEA OR VOMITING. 60 tablet 0   promethazine (PHENERGAN) 25 MG tablet Take 1 tablet (25 mg total) by mouth every 6 (six) hours as needed for nausea or vomiting. 60 tablet 0   ranolazine (RANEXA) 500 MG 12 hr tablet TAKE 1 TABLET BY MOUTH TWICE A DAY 180 tablet 2   rizatriptan (MAXALT) 10 MG tablet TAKE 1 TABLET (10 MG TOTAL) BY MOUTH AS NEEDED FOR MIGRAINE. MAY REPEAT IN 2 HOURS IF NEEDED 10 tablet 0   rOPINIRole (REQUIP) 2 MG tablet TAKE 1 TABLET BY MOUTH EVERYDAY AT BEDTIME 90 tablet 1   rosuvastatin (CRESTOR) 20 MG tablet TAKE 1 TABLET BY MOUTH EVERY DAY 90 tablet 1   rosuvastatin (CRESTOR) 40 MG tablet Take 1 tablet (40 mg total) by mouth daily. 90 tablet 3   No facility-administered medications prior to visit.    Allergies  Allergen Reactions   Prednisone Other (See Comments)    Agitation, hyperactivity, polyphagia   Amiodarone Nausea And Vomiting   Hydrocodone-Acetaminophen Other (See Comments)    Hallucinations and combative   Oxycodone Itching    Can take it with benadryl   Tapentadol Itching   Tramadol Itching    Can take it with benadryl   Adhesive [Tape] Itching and Rash    Prefers paper tape   Latex Itching and Rash    use paper tap    ROS Review of Systems  Constitutional: Negative.   HENT: Negative.    Respiratory:  Positive for shortness of breath. Negative for chest tightness.   Cardiovascular:  Negative for chest pain and palpitations.  Gastrointestinal: Negative.   Genitourinary: Negative.   Musculoskeletal:  Positive for back pain.  Neurological:  Positive for light-headedness and headaches.  Psychiatric/Behavioral: Negative.        Objective:    Physical Exam Constitutional:      Appearance: Normal appearance.  She is obese.  HENT:     Head: Normocephalic and atraumatic.     Nose: Nose normal.     Mouth/Throat:     Mouth: Mucous membranes are moist.  Eyes:     Conjunctiva/sclera: Conjunctivae normal.     Pupils: Pupils are equal, round, and reactive to light.  Cardiovascular:     Rate and Rhythm: Normal rate and regular rhythm.     Pulses: Normal pulses.     Heart sounds: Normal heart sounds. No murmur heard. Pulmonary:     Effort: Pulmonary effort is normal.     Breath sounds: Normal breath sounds.  Abdominal:     General: There is no distension.     Hernia: No hernia is present.  Musculoskeletal:        General: Normal range of motion.  Skin:    General: Skin is warm.     Capillary Refill: Capillary refill takes less than 2 seconds.  Neurological:     General: No  focal deficit present.     Mental Status: She is alert and oriented to person, place, and time. Mental status is at baseline.  Psychiatric:        Mood and Affect: Mood normal.        Behavior: Behavior normal.        Thought Content: Thought content normal.        Judgment: Judgment normal.     BP (!) 162/92   Pulse 96   Temp (!) 97.4 F (36.3 C)   Ht '5\' 5"'$  (1.651 m)   Wt 259 lb (117.5 kg)   SpO2 99%   BMI 43.10 kg/m  Wt Readings from Last 3 Encounters:  07/28/22 259 lb (117.5 kg)  07/27/22 258 lb 6.4 oz (117.2 kg)  07/06/22 266 lb 11.2 oz (121 kg)     Health Maintenance  Topic Date Due   COVID-19 Vaccine (6 - 2023-24 season) 08/13/2022 (Originally 06/02/2022)   Medicare Annual Wellness ([place])  12/10/2022   COLONOSCOPY (Pts 45-93yr Insurance coverage will need to be confirmed)  05/14/2023   MAMMOGRAM  01/04/2024   DTaP/Tdap/Td (3 - Td or Tdap) 03/06/2031   Pneumonia Vaccine 70 Years old  Completed   INFLUENZA VACCINE  Completed   DEXA SCAN  Completed   Hepatitis C Screening  Completed   Zoster Vaccines- Shingrix  Completed   HPV VACCINES  Aged Out    There are no preventive care reminders to  display for this patient.  Lab Results  Component Value Date   TSH 2.060 07/02/2021   Lab Results  Component Value Date   WBC 7.0 07/27/2022   HGB 14.4 07/27/2022   HCT 43.5 07/27/2022   MCV 91.4 07/27/2022   PLT 189 07/27/2022   Lab Results  Component Value Date   NA 139 07/27/2022   K 3.7 07/27/2022   CO2 30 07/27/2022   GLUCOSE 112 (H) 07/27/2022   BUN 19 07/27/2022   CREATININE 0.87 07/27/2022   BILITOT 0.6 07/27/2022   ALKPHOS 84 07/27/2022   AST 25 07/27/2022   ALT 18 07/27/2022   PROT 7.7 07/27/2022   ALBUMIN 4.0 07/27/2022   CALCIUM 8.9 07/27/2022   ANIONGAP 9 07/27/2022   GFR 55.32 (L) 03/12/2021   Lab Results  Component Value Date   CHOL 192 03/11/2022   Lab Results  Component Value Date   HDL 60.50 03/11/2022   Lab Results  Component Value Date   LDLCALC 109 (H) 03/11/2022   Lab Results  Component Value Date   TRIG 114.0 03/11/2022   Lab Results  Component Value Date   CHOLHDL 3 03/11/2022   Lab Results  Component Value Date   HGBA1C 4.5 02/07/2022      Assessment & Plan:   Problem List Items Addressed This Visit       Cardiovascular and Mediastinum   Hypertension - Primary    Patient BP  Vitals:   07/28/22 1112  BP: (!) 162/92    in the office today. Advised pt to follow a low sodium and heart healthy diet. Started her on losartan HCTZ 50/12.5 mg, half a tablet daily. Encourage patient to check blood pressure and bring the readings to next follow-up in 2 weeks.      Relevant Medications   losartan-hydrochlorothiazide (HYZAAR) 50-12.5 MG tablet     Meds ordered this encounter  Medications   losartan-hydrochlorothiazide (HYZAAR) 50-12.5 MG tablet    Sig: Take half tablet once a day.    Dispense:  30 tablet    Refill:  1     Follow-up: Return in about 2 weeks (around 08/11/2022).    Theresia Lo, NP

## 2022-07-29 ENCOUNTER — Other Ambulatory Visit: Payer: Self-pay

## 2022-07-31 ENCOUNTER — Encounter: Payer: Self-pay | Admitting: Nurse Practitioner

## 2022-07-31 NOTE — Assessment & Plan Note (Signed)
Patient BP  Vitals:   07/28/22 1112  BP: (!) 162/92    in the office today. Advised pt to follow a low sodium and heart healthy diet. Started her on losartan HCTZ 50/12.5 mg, half a tablet daily. Encourage patient to check blood pressure and bring the readings to next follow-up in 2 weeks.

## 2022-08-01 DIAGNOSIS — M9903 Segmental and somatic dysfunction of lumbar region: Secondary | ICD-10-CM | POA: Diagnosis not present

## 2022-08-01 DIAGNOSIS — M542 Cervicalgia: Secondary | ICD-10-CM | POA: Diagnosis not present

## 2022-08-01 DIAGNOSIS — G43001 Migraine without aura, not intractable, with status migrainosus: Secondary | ICD-10-CM | POA: Diagnosis not present

## 2022-08-01 DIAGNOSIS — M9901 Segmental and somatic dysfunction of cervical region: Secondary | ICD-10-CM | POA: Diagnosis not present

## 2022-08-02 ENCOUNTER — Telehealth: Payer: Medicare Other | Admitting: Physician Assistant

## 2022-08-02 DIAGNOSIS — I959 Hypotension, unspecified: Secondary | ICD-10-CM

## 2022-08-02 NOTE — Progress Notes (Signed)
Virtual Visit Consent   Amber Barnes, you are scheduled for a virtual visit with a [facility] provider today. Just as with appointments in the office, your consent must be obtained to participate. Your consent will be active for this visit and any virtual visit you may have with one of our providers in the next 365 days. If you have a MyChart account, a copy of this consent can be sent to you electronically.  As this is a virtual visit, video technology does not allow for your provider to perform a traditional examination. This may limit your provider's ability to fully assess your condition. If your provider identifies any concerns that need to be evaluated in person or the need to arrange testing (such as labs, EKG, etc.), we will make arrangements to do so. Although advances in technology are sophisticated, we cannot ensure that it will always work on either your end or our end. If the connection with a video visit is poor, the visit may have to be switched to a telephone visit. With either a video or telephone visit, we are not always able to ensure that we have a secure connection.  By engaging in this virtual visit, you consent to the provision of healthcare and authorize for your insurance to be billed (if applicable) for the services provided during this visit. Depending on your insurance coverage, you may receive a charge related to this service.  I need to obtain your verbal consent now. Are you willing to proceed with your visit today? Amber Barnes has provided verbal consent on 08/02/2022 for a virtual visit (video or telephone). Leeanne Rio, [place]  Date: 08/02/2022 7:21 PM  Virtual Visit via Video Note   I, Leeanne Rio, connected with  Amber Barnes  ([id number], 70-Apr-1952) on 08/02/22 at  7:15 PM EST by a video-enabled telemedicine application and verified that I am speaking with the correct person using two identifiers.  Location: Patient: Virtual Visit  Location Patient: Home Provider: Virtual Visit Location Provider: Home Office   I discussed the limitations of evaluation and management by telemedicine and the availability of in person appointments. The patient expressed understanding and agreed to proceed.    History of Present Illness: Amber Barnes is a 72 y.o. who identifies as a female who was assigned female at birth, and is being seen today for lower BP readings after starting new BP medication. Patient with history of atrial fibrillation, hypertension, chronic diastolic heart failure, OSA, COPD, CKD and breat cancer. She was recently started on losartan-HCTZ 50-12.5 to take 1/2 tablet once daily to help lower BP. Notes that BP has been low over past few days, down to 80 systolic. Today with positional lightheadedness, checked BP and noted at 80/60 with HR 97 despite her diltiazem. Denies chest pain, sob. Denies lightheadedness or dizziness at rest. Has been hydrating. Denies change to diet, activity level or stress. Is taking her other regular medications as directed. Denies PND, orthopnea, increased leg swelling. Noted her BP cuff is relatively new and has been accurate with in-office readings int he past.    HPI: HPI  Problems:  Patient Active Problem List   Diagnosis Date Noted   Hypocalcemia 03/23/2022   Primary localized osteoarthritis of pelvic region and thigh 03/02/2022   Displacement of lumbar intervertebral disc without myelopathy 03/02/2022   Left hip pain 01/05/2022   Dizziness 12/10/2021   Chronic pain of left knee 11/16/2021   Leg swelling 10/27/2021   Encounter for monoclonal antibody treatment  for malignancy 09/29/2021   Ureterovesical junction (UVJ) obstruction 07/12/2021   Acute pyelonephritis 07/12/2021   CKD (chronic kidney disease), stage II 07/12/2021   HLD (hyperlipidemia) 07/12/2021   Chronic diastolic CHF (congestive heart failure) ([place]) 07/12/2021   Hydroureteronephrosis 07/12/2021   Pyelonephritis  07/02/2021   Drug-induced neutropenia ([place]) 06/23/2021   CKD (chronic kidney disease) 06/21/2021   Chemotherapy induced diarrhea 06/09/2021   Anemia due to antineoplastic chemotherapy 05/18/2021   Chemotherapy induced nausea and vomiting 05/18/2021   COVID-19 04/30/2021   Neutropenia ([place])    Anxiety    Intractable vomiting    Diarrhea    Neutropenic fever ([place]) 04/05/2021   Encounter for antineoplastic chemotherapy 03/24/2021   Neuropathy 03/24/2021   Port-A-Cath in place 03/24/2021   HER2-positive carcinoma of breast ([place]) 01/26/2021   Goals of care, counseling/discussion 01/26/2021   Malignant neoplasm of upper-outer quadrant of right female breast ([place]) 01/19/2021   Left arm pain 12/02/2020   Soft tissue mass 12/02/2020   Candidal intertrigo 09/09/2020   Urinary frequency 09/09/2020   Night sweat 03/31/2020   Family history of CLL (chronic lymphoid leukemia) 03/21/2020   Nasal drainage 12/18/2019   Sinus congestion 12/18/2019   Nausea 10/30/2019   Major depression in remission ([place]) 08/27/2018   Primary osteoarthritis involving multiple joints 08/27/2018   Atrial fibrillation ([place]) 08/27/2018   COPD (chronic obstructive pulmonary disease) ([place]) 08/27/2018   BMI 50.0-59.9, adult ([place]) 07/16/2018   Morbid obesity with BMI of 50.0-59.9, adult ([place]) 05/31/2018   Bradycardia 05/22/2017   Restless leg syndrome 04/13/2017   OSA (obstructive sleep apnea) 01/09/2017   A-fib ([place]) 11/28/2016   Atrial fibrillation with RVR ([place]) 07/27/2016   Fatty liver disease, nonalcoholic 29/52/8413   Vertigo 06/08/2016   Family history of ovarian cancer 06/08/2016   GERD (gastroesophageal reflux disease) 05/05/2016   Obesity 11/11/2014   Hypertension 11/11/2014   Anxiety and depression 11/11/2014   Weight loss 11/11/2014    Allergies:  Allergies  Allergen Reactions   Prednisone Other (See Comments)    Agitation, hyperactivity, polyphagia   Amiodarone Nausea And Vomiting    Hydrocodone-Acetaminophen Other (See Comments)    Hallucinations and combative   Oxycodone Itching    Can take it with benadryl   Tapentadol Itching   Tramadol Itching    Can take it with benadryl   Adhesive [Tape] Itching and Rash    Prefers paper tape   Latex Itching and Rash    use paper tap   Medications:  Current Outpatient Medications:    acetaminophen (TYLENOL) 500 MG tablet, Take 500-1,000 mg by mouth every 6 (six) hours as needed for mild pain or moderate pain., Disp: , Rfl:    ALPRAZolam (XANAX) 0.25 MG tablet, Take 1 tablet (0.25 mg total) by mouth 2 (two) times daily as needed for anxiety., Disp: 60 tablet, Rfl: 2   apixaban (ELIQUIS) 5 MG TABS tablet, TAKE 1 TABLET BY MOUTH TWICE A DAY, Disp: 180 tablet, Rfl: 1   bisoprolol (ZEBETA) 10 MG tablet, Take 1 tablet (10 mg total) by mouth 2 (two) times daily., Disp: 120 tablet, Rfl: 3   busPIRone (BUSPAR) 5 MG tablet, TAKE 1 TABLET BY MOUTH THREE TIMES A DAY AS NEEDED, Disp: 60 tablet, Rfl: 1   Cholecalciferol (VITAMIN D3) 5000 units CAPS, Take 5,000 Units by mouth daily., Disp: , Rfl:    clotrimazole (LOTRIMIN) 1 % cream, Apply 1 application topically 2 (two) times daily., Disp: 30 g, Rfl: 0   diltiazem (CARDIZEM CD) 240  MG 24 hr capsule, Take 240 mg by mouth as needed., Disp: , Rfl:    diphenhydrAMINE (BENADRYL) 25 mg capsule, Take 1 capsule (25 mg total) by mouth every 8 (eight) hours as needed for itching or allergies., Disp: 30 capsule, Rfl: 0   esomeprazole (NEXIUM) 20 MG capsule, Take 20 mg by mouth daily as needed (GERD symptoms)., Disp: , Rfl:    flecainide (TAMBOCOR) 100 MG tablet, Take 1 tablet (100 mg total) by mouth 2 (two) times daily., Disp: 180 tablet, Rfl: 3   FLUoxetine (PROZAC) 20 MG capsule, Take 1 capsule (20 mg total) by mouth every morning., Disp: 90 capsule, Rfl: 3   furosemide (LASIX) 20 MG tablet, TAKE 1 TABLET BY MOUTH TWICE A DAY, Disp: 180 tablet, Rfl: 0   gabapentin (NEURONTIN) 100 MG capsule, TAKE 1  CAPSULE (100 MG TOTAL) BY MOUTH 3 TIMES A DAY, Disp: 270 capsule, Rfl: 0   KLOR-CON M20 20 MEQ tablet, TAKE 1 TABLET BY MOUTH EVERY DAY, Disp: 90 tablet, Rfl: 1   losartan-hydrochlorothiazide (HYZAAR) 50-12.5 MG tablet, Take half tablet once a day., Disp: 30 tablet, Rfl: 1   meclizine (ANTIVERT) 12.5 MG tablet, Take 1 tablet (12.5 mg total) by mouth 3 (three) times daily as needed for dizziness., Disp: 30 tablet, Rfl: 0   Multiple Vitamin (MULTIVITAMIN) tablet, Take 1 tablet by mouth daily., Disp: , Rfl:    nystatin (MYCOSTATIN/NYSTOP) powder, Apply 1 application. topically 3 (three) times daily., Disp: 60 g, Rfl: 11   nystatin cream (MYCOSTATIN), Apply 1 application. topically 2 (two) times daily. Prn abdomen, Disp: 60 g, Rfl: 5   ondansetron (ZOFRAN) 8 MG tablet, TAKE 1 TABLET BY MOUTH EVERY 8 HOURS AS NEEDED FOR NAUSEA OR VOMITING., Disp: 60 tablet, Rfl: 0   promethazine (PHENERGAN) 25 MG tablet, Take 1 tablet (25 mg total) by mouth every 6 (six) hours as needed for nausea or vomiting., Disp: 60 tablet, Rfl: 0   ranolazine (RANEXA) 500 MG 12 hr tablet, TAKE 1 TABLET BY MOUTH TWICE A DAY, Disp: 180 tablet, Rfl: 2   rizatriptan (MAXALT) 10 MG tablet, TAKE 1 TABLET (10 MG TOTAL) BY MOUTH AS NEEDED FOR MIGRAINE. MAY REPEAT IN 2 HOURS IF NEEDED, Disp: 10 tablet, Rfl: 0   rOPINIRole (REQUIP) 2 MG tablet, TAKE 1 TABLET BY MOUTH EVERYDAY AT BEDTIME, Disp: 90 tablet, Rfl: 1   rosuvastatin (CRESTOR) 20 MG tablet, TAKE 1 TABLET BY MOUTH EVERY DAY, Disp: 90 tablet, Rfl: 1   rosuvastatin (CRESTOR) 40 MG tablet, Take 1 tablet (40 mg total) by mouth daily., Disp: 90 tablet, Rfl: 3  Observations/Objective: Patient is well-developed, well-nourished in no acute distress.  Resting comfortably at home.  Head is normocephalic, atraumatic.  No labored breathing. Speech is clear and coherent with logical content.  Patient is alert and oriented at baseline.   Assessment and Plan: 1. Hypotension, unspecified  hypotension type  BP 80/60. Positional lightheadedness likely due to orthostatic hypotension. Thanfkully asymptomatic otherwise. Want her to hydrate. Will have her increase dietary sodium tonight only to help keep BP at a better level. Compression stocking and continue to elevate legs while resting. No quick positional changes. Will have her hold tomorrow's dose of losartan-HCTZ. She is to call PCP office in the morning for follow-up and further adjustment. May benefit from switching from combo to plain losartan 25 mg once daily. Will send copy of note to PCP.  Strict ER precautions reviewed.  Follow Up Instructions: I discussed the assessment and treatment plan  with the patient. The patient was provided an opportunity to ask questions and all were answered. The patient agreed with the plan and demonstrated an understanding of the instructions.  A copy of instructions were sent to the patient via MyChart unless otherwise noted below.   The patient was advised to call back or seek an in-person evaluation if the symptoms worsen or if the condition fails to improve as anticipated.  Time:  I spent 20 minutes with the patient via telehealth technology discussing the above problems/concerns.    Leeanne Rio, PA-C

## 2022-08-02 NOTE — Patient Instructions (Signed)
[name], thank you for joining Leeanne Rio, PA-C for today's virtual visit.  While this provider is not your primary care provider (PCP), if your PCP is located in our provider database this encounter information will be shared with them immediately following your visit.   [facility] account gives you access to today's visit and all your visits, tests, and labs performed at [facility] " click here if you don't have a [facility] account or go to [email or web address]  Consent: (Patient) Amber Barnes provided verbal consent for this virtual visit at the beginning of the encounter.  Current Medications:  Current Outpatient Medications:    acetaminophen (TYLENOL) 500 MG tablet, Take 500-1,000 mg by mouth every 6 (six) hours as needed for mild pain or moderate pain., Disp: , Rfl:    ALPRAZolam (XANAX) 0.25 MG tablet, Take 1 tablet (0.25 mg total) by mouth 2 (two) times daily as needed for anxiety., Disp: 60 tablet, Rfl: 2   apixaban (ELIQUIS) 5 MG TABS tablet, TAKE 1 TABLET BY MOUTH TWICE A DAY, Disp: 180 tablet, Rfl: 1   bisoprolol (ZEBETA) 10 MG tablet, Take 1 tablet (10 mg total) by mouth 2 (two) times daily., Disp: 120 tablet, Rfl: 3   busPIRone (BUSPAR) 5 MG tablet, TAKE 1 TABLET BY MOUTH THREE TIMES A DAY AS NEEDED, Disp: 60 tablet, Rfl: 1   Cholecalciferol (VITAMIN D3) 5000 units CAPS, Take 5,000 Units by mouth daily., Disp: , Rfl:    clotrimazole (LOTRIMIN) 1 % cream, Apply 1 application topically 2 (two) times daily., Disp: 30 g, Rfl: 0   diltiazem (CARDIZEM CD) 240 MG 24 hr capsule, Take 240 mg by mouth as needed., Disp: , Rfl:    diphenhydrAMINE (BENADRYL) 25 mg capsule, Take 1 capsule (25 mg total) by mouth every 8 (eight) hours as needed for itching or allergies., Disp: 30 capsule, Rfl: 0   esomeprazole (NEXIUM) 20 MG capsule, Take 20 mg by mouth daily as needed (GERD symptoms)., Disp: , Rfl:    flecainide (TAMBOCOR) 100 MG  tablet, Take 1 tablet (100 mg total) by mouth 2 (two) times daily., Disp: 180 tablet, Rfl: 3   FLUoxetine (PROZAC) 20 MG capsule, Take 1 capsule (20 mg total) by mouth every morning., Disp: 90 capsule, Rfl: 3   furosemide (LASIX) 20 MG tablet, TAKE 1 TABLET BY MOUTH TWICE A DAY, Disp: 180 tablet, Rfl: 0   gabapentin (NEURONTIN) 100 MG capsule, TAKE 1 CAPSULE (100 MG TOTAL) BY MOUTH 3 TIMES A DAY, Disp: 270 capsule, Rfl: 0   KLOR-CON M20 20 MEQ tablet, TAKE 1 TABLET BY MOUTH EVERY DAY, Disp: 90 tablet, Rfl: 1   losartan-hydrochlorothiazide (HYZAAR) 50-12.5 MG tablet, Take half tablet once a day., Disp: 30 tablet, Rfl: 1   meclizine (ANTIVERT) 12.5 MG tablet, Take 1 tablet (12.5 mg total) by mouth 3 (three) times daily as needed for dizziness., Disp: 30 tablet, Rfl: 0   Multiple Vitamin (MULTIVITAMIN) tablet, Take 1 tablet by mouth daily., Disp: , Rfl:    nystatin (MYCOSTATIN/NYSTOP) powder, Apply 1 application. topically 3 (three) times daily., Disp: 60 g, Rfl: 11   nystatin cream (MYCOSTATIN), Apply 1 application. topically 2 (two) times daily. Prn abdomen, Disp: 60 g, Rfl: 5   ondansetron (ZOFRAN) 8 MG tablet, TAKE 1 TABLET BY MOUTH EVERY 8 HOURS AS NEEDED FOR NAUSEA OR VOMITING., Disp: 60 tablet, Rfl: 0   promethazine (PHENERGAN) 25 MG tablet, Take 1 tablet (25 mg total) by mouth every 6 (six)  hours as needed for nausea or vomiting., Disp: 60 tablet, Rfl: 0   ranolazine (RANEXA) 500 MG 12 hr tablet, TAKE 1 TABLET BY MOUTH TWICE A DAY, Disp: 180 tablet, Rfl: 2   rizatriptan (MAXALT) 10 MG tablet, TAKE 1 TABLET (10 MG TOTAL) BY MOUTH AS NEEDED FOR MIGRAINE. MAY REPEAT IN 2 HOURS IF NEEDED, Disp: 10 tablet, Rfl: 0   rOPINIRole (REQUIP) 2 MG tablet, TAKE 1 TABLET BY MOUTH EVERYDAY AT BEDTIME, Disp: 90 tablet, Rfl: 1   rosuvastatin (CRESTOR) 20 MG tablet, TAKE 1 TABLET BY MOUTH EVERY DAY, Disp: 90 tablet, Rfl: 1   rosuvastatin (CRESTOR) 40 MG tablet, Take 1 tablet (40 mg total) by mouth daily., Disp: 90  tablet, Rfl: 3   Medications ordered in this encounter:  No orders of the defined types were placed in this encounter.    *If you need refills on other medications prior to your next appointment, please contact your pharmacy*  Follow-Up: Call back or seek an in-person evaluation if the symptoms worsen or if the condition fails to improve as anticipated.  [place] [phone]  Other Instructions Please keep hydrated. Ok to increase dietary sodium intake (salt) tonight -- handful of salted nuts, deli meat, etc -- just to keep BP at better level overnight. Elevate legs and rest. Avoid sudden position changes. If you have compression stockings, wear them. Do not take the morning dose of losartan-hctz. Ok to continue the rest of your medications as directed. I want you to call your PCP office in the morning to get scheduled for follow-up and further management.  If you note any shortness of breath, chest pain, dizziness at rest, or any further decline in BP tonight, please be evaluated at nearest ER.   If you have been instructed to have an in-person evaluation today at a local Urgent Care facility, please use the link below. It will take you to a list of all of our available [facility], including address, phone number and hours of operation. Please do not delay care.  [facility]  If you or a family member do not have a primary care provider, use the link below to schedule a visit and establish care. When you choose a [facility] primary care physician or advanced practice provider, you gain a long-term partner in health. Find a Primary Care Provider  Learn more about [facility] in-office and virtual care options: [facility]

## 2022-08-03 DIAGNOSIS — M9901 Segmental and somatic dysfunction of cervical region: Secondary | ICD-10-CM | POA: Diagnosis not present

## 2022-08-03 DIAGNOSIS — M542 Cervicalgia: Secondary | ICD-10-CM | POA: Diagnosis not present

## 2022-08-03 DIAGNOSIS — M9903 Segmental and somatic dysfunction of lumbar region: Secondary | ICD-10-CM | POA: Diagnosis not present

## 2022-08-03 DIAGNOSIS — G43001 Migraine without aura, not intractable, with status migrainosus: Secondary | ICD-10-CM | POA: Diagnosis not present

## 2022-08-04 DIAGNOSIS — M9901 Segmental and somatic dysfunction of cervical region: Secondary | ICD-10-CM | POA: Diagnosis not present

## 2022-08-04 DIAGNOSIS — M9903 Segmental and somatic dysfunction of lumbar region: Secondary | ICD-10-CM | POA: Diagnosis not present

## 2022-08-04 DIAGNOSIS — G43001 Migraine without aura, not intractable, with status migrainosus: Secondary | ICD-10-CM | POA: Diagnosis not present

## 2022-08-04 DIAGNOSIS — M542 Cervicalgia: Secondary | ICD-10-CM | POA: Diagnosis not present

## 2022-08-06 ENCOUNTER — Other Ambulatory Visit: Payer: Self-pay | Admitting: Family

## 2022-08-09 NOTE — Progress Notes (Unsigned)
Electrophysiology Office Note:    Date:  08/10/2022   ID:  Amber Barnes, DOB [birth year], MRN [id number]  PCP:  [name], FNP  [facility] Cardiologist:  Ida Rogue, MD  [facility] Electrophysiologist:  Vickie Epley, MD   Referring MD: [name], FNP   Chief Complaint: AF  History of Present Illness:    Amber Barnes is a 72 y.o. female who presents for an evaluation of AF at the request of Dr [name]. Their medical history includes breast cancer, HFpEF, AF on Eliquis, COPD, OSA.  The patient was last seen by Dr [name] 05/09/2022. She was previously on flecainide but continued to have episodes of AF. Previously prescribed amiodarone but this was stopped due to GI side effects. Also with history of hematuria. Previously was cardioverted.   She feels very fatigued while in atrial fibrillation.  She tells me that yesterday she was in normal rhythm and was able to walk without much limitation.  Today she could barely get in from the parking lot because she knows she is back in A-fib.  This is confirmed on today's EKG.  She has been taking her diltiazem daily for at least the last 5 days because of her atrial fibrillation.     Past Medical History:  Diagnosis Date   Achilles tendinitis    Acute medial meniscal tear    Allergy    Anxiety    Atrial fibrillation ([place])    a. new onset 07/2016; b. CHADS2VASc --> 4 (HTN, female, age, CHF); c. eliquis   Chemotherapy induced nausea and vomiting 05/18/2021   CHF (congestive heart failure) (HCC)    Chronic anticoagulation    Apixaban   Chronic lower back pain    Colon polyp    Complication of anesthesia    Emergence delirium; hypoxia   COPD (chronic obstructive pulmonary disease) ([place])    Cystic breast    Depression    Endometriosis    Fibrocystic breast disease    GERD (gastroesophageal reflux disease)    History of stress test    a. 07/2016 MV: EF 55-65%. Small, mild defect  in mid anteroseptal,  apical septal, and apical locations. No ischemia-->low risk.   Hyperlipidemia    Hypertension    Insomnia    Lipoma    Malignant neoplasm of upper-outer quadrant of right female breast (HCC)    Stage 1A invasive mammary carcinoma (cT1b or T1c N0); ER (-), PR (+), HER2/neu (+)   Migraine    Mild Mitral regurgitation    a. 07/2016 Echo: EF 55-65%, no rwma, mild MR. Mildly dil LA. Nl RV fx.    Nephrolithiasis    OSA (obstructive sleep apnea)    non-compliant with nocturnal PAP therapy   Osteoarthritis    left knee   Osteopenia    Peptic ulcer disease    a. H. pylori   Pneumonia    RLS (restless legs syndrome)    Tendonitis    Thyroid disease    Vitamin D deficiency     Past Surgical History:  Procedure Laterality Date   ABDOMINAL HYSTERECTOMY     ovaries left   ACHILLES TENDON SURGERY     2012,01/2017   BACK SURGERY     fusion lumbar   BREAST BIOPSY Right 01/15/2021   Affirm biopsy-"X" clip-INVASIVE MAMMARY CARCINOMA   BREAST BIOPSY Right 01/15/2021   u/s bx-"venus" clip INVASIVE MAMMARY CARCINOMA   BREAST BIOPSY     BREAST LUMPECTOMY,RADIO FREQ LOCALIZER,AXILLARY SENTINEL LYMPH NODE  BIOPSY Right 02/17/2021   Procedure: BREAST LUMPECTOMY,RADIO FREQ LOCALIZER,AXILLARY SENTINEL LYMPH NODE BIOPSY;  Surgeon: Ronny Bacon, MD;  Location: [facility];  Service: General;  Laterality: Right;   CARDIOVERSION N/A 09/07/2016   Procedure: CARDIOVERSION;  Surgeon: Minna Merritts, MD;  Location: [facility];  Service: Cardiovascular;  Laterality: N/A;   CARDIOVERSION N/A 10/19/2016   Procedure: CARDIOVERSION;  Surgeon: Minna Merritts, MD;  Location: [facility];  Service: Cardiovascular;  Laterality: N/A;   CARDIOVERSION N/A 12/14/2016   Procedure: CARDIOVERSION;  Surgeon: Minna Merritts, MD;  Location: [facility];  Service: Cardiovascular;  Laterality: N/A;   COLONOSCOPY     COLONOSCOPY WITH PROPOFOL N/A 05/13/2020   Procedure: COLONOSCOPY WITH PROPOFOL;  Surgeon: Toledo, Benay Pike, MD;   Location: [facility];  Service: Gastroenterology;  Laterality: N/A;  ELIQUIS   CYSTOSCOPY WITH STENT PLACEMENT Bilateral 07/12/2021   Procedure: CYSTOSCOPY WITH STENT PLACEMENT;  Surgeon: Billey Co, MD;  Location: [facility];  Service: Urology;  Laterality: Bilateral;   CYSTOSCOPY/URETEROSCOPY/HOLMIUM LASER/STENT PLACEMENT Bilateral 07/30/2021   Procedure: CYSTOSCOPY/URETEROSCOPY/HOLMIUM LASER/BILATERAL STENT REMOVAL;  Surgeon: Billey Co, MD;  Location: [facility];  Service: Urology;  Laterality: Bilateral;   ESOPHAGOGASTRODUODENOSCOPY     ESOPHAGOGASTRODUODENOSCOPY (EGD) WITH PROPOFOL N/A 05/13/2020   Procedure: ESOPHAGOGASTRODUODENOSCOPY (EGD) WITH PROPOFOL;  Surgeon: Toledo, Benay Pike, MD;  Location: [facility];  Service: Gastroenterology;  Laterality: N/A;   FOOT SURGERY Left    tendon   JOINT REPLACEMENT Right    hip   PORTA CATH INSERTION N/A 03/23/2021   Procedure: PORTA CATH INSERTION;  Surgeon: Katha Cabal, MD;  Location: [facility];  Service: Cardiovascular;  Laterality: N/A;   PORTACATH PLACEMENT N/A 03/23/2021   Procedure: ATTEMPTED INSERTION PORT-A-CATH;  Surgeon: Ronny Bacon, MD;  Location: [facility];  Service: General;  Laterality: N/A;   THYROIDECTOMY  1990   half thyroid lobectomy secondary to a benign nodule   TOTAL HIP ARTHROPLASTY Right 08/21/2018   Procedure: TOTAL HIP ARTHROPLASTY ANTERIOR APPROACH-RIGHT CELL SAVER REQUESTED;  Surgeon: [name], MD;  Location: [facility];  Service: Orthopedics;  Laterality: Right;    Current Medications: Current Meds  Medication Sig   acetaminophen (TYLENOL) 500 MG tablet Take 500-1,000 mg by mouth every 6 (six) hours as needed for mild pain or moderate pain.   ALPRAZolam (XANAX) 0.25 MG tablet Take 1 tablet (0.25 mg total) by mouth 2 (two) times daily as needed for anxiety.   apixaban (ELIQUIS) 5 MG TABS tablet TAKE 1 TABLET BY MOUTH TWICE A DAY   bisoprolol (ZEBETA) 10 MG tablet Take 1  tablet (10 mg total) by mouth 2 (two) times daily.   busPIRone (BUSPAR) 5 MG tablet TAKE 1 TABLET BY MOUTH THREE TIMES A DAY AS NEEDED   Cholecalciferol (VITAMIN D3) 5000 units CAPS Take 5,000 Units by mouth daily.   clotrimazole (LOTRIMIN) 1 % cream Apply 1 application topically 2 (two) times daily.   diltiazem (CARDIZEM CD) 240 MG 24 hr capsule Take 240 mg by mouth as needed.   diphenhydrAMINE (BENADRYL) 25 mg capsule Take 1 capsule (25 mg total) by mouth every 8 (eight) hours as needed for itching or allergies.   esomeprazole (NEXIUM) 20 MG capsule Take 20 mg by mouth daily as needed (GERD symptoms).   flecainide (TAMBOCOR) 100 MG tablet Take 1 tablet (100 mg total) by mouth 2 (two) times daily.   FLUoxetine (PROZAC) 20 MG capsule Take 1 capsule (20 mg total) by mouth every morning.   furosemide (  LASIX) 20 MG tablet TAKE 1 TABLET BY MOUTH TWICE A DAY   gabapentin (NEURONTIN) 100 MG capsule TAKE 1 CAPSULE (100 MG TOTAL) BY MOUTH 3 TIMES A DAY   KLOR-CON M20 20 MEQ tablet TAKE 1 TABLET BY MOUTH EVERY DAY   Liraglutide -Weight Management (SAXENDA) 18 MG/3ML SOPN Inject 0.6 mg into the skin daily.   losartan-hydrochlorothiazide (HYZAAR) 50-12.5 MG tablet Take half tablet once a day.   meclizine (ANTIVERT) 12.5 MG tablet Take 1 tablet (12.5 mg total) by mouth 3 (three) times daily as needed for dizziness.   Multiple Vitamin (MULTIVITAMIN) tablet Take 1 tablet by mouth daily.   nystatin (MYCOSTATIN/NYSTOP) powder Apply 1 application. topically 3 (three) times daily.   nystatin cream (MYCOSTATIN) Apply 1 application. topically 2 (two) times daily. Prn abdomen   ondansetron (ZOFRAN) 8 MG tablet TAKE 1 TABLET BY MOUTH EVERY 8 HOURS AS NEEDED FOR NAUSEA OR VOMITING.   promethazine (PHENERGAN) 25 MG tablet Take 1 tablet (25 mg total) by mouth every 6 (six) hours as needed for nausea or vomiting.   ranolazine (RANEXA) 500 MG 12 hr tablet TAKE 1 TABLET BY MOUTH TWICE A DAY   rizatriptan (MAXALT) 10 MG  tablet TAKE 1 TABLET (10 MG TOTAL) BY MOUTH AS NEEDED FOR MIGRAINE. MAY REPEAT IN 2 HOURS IF NEEDED   rOPINIRole (REQUIP) 2 MG tablet TAKE 1 TABLET BY MOUTH EVERYDAY AT BEDTIME   rosuvastatin (CRESTOR) 20 MG tablet TAKE 1 TABLET BY MOUTH EVERY DAY   rosuvastatin (CRESTOR) 40 MG tablet Take 1 tablet (40 mg total) by mouth daily.     Allergies:   Prednisone, Amiodarone, Hydrocodone-acetaminophen, Oxycodone, Tapentadol, Tramadol, Adhesive [tape], and Latex   Social History   Socioeconomic History   Marital status: Married    Spouse name: jeffrey   Number of children: 1   Years of education: Not on file   Highest education level: High school graduate  Occupational History    Comment: disabled  Tobacco Use   Smoking status: Former    Packs/day: 0.25    Years: 10.00    Total pack years: 2.50    Types: Cigarettes    Quit date: 2005    Years since quitting: 19.0   Smokeless tobacco: Never   Tobacco comments:    smoked for 10 years, 1 pack every 2-3 days  Vaping Use   Vaping Use: Never used  Substance and Sexual Activity   Alcohol use: Not Currently    Alcohol/week: 1.0 standard drink of alcohol    Types: 1 Glasses of wine per week    Comment: rarely   Drug use: No   Sexual activity: Not Currently  Other Topics Concern   Not on file  Social History Narrative   Moved to [place] from [place], originally from [place].    1 cup of caffeine daily   Right handed   Lives with husband, step-daughter and child       Social Determinants of Health   Financial Resource Strain: Low Risk  (12/09/2021)   Overall Financial Resource Strain (CARDIA)    Difficulty of Paying Living Expenses: Not very hard  Food Insecurity: No Food Insecurity (12/09/2021)   Hunger Vital Sign    Worried About Running Out of Food in the Last Year: Never true    Ran Out of Food in the Last Year: Never true  Transportation Needs: No Transportation Needs (12/09/2021)   PRAPARE - Armed forces logistics/support/administrative officer (Medical):  No    Lack of Transportation (Non-Medical): No  Physical Activity: Insufficiently Active (12/09/2021)   Exercise Vital Sign    Days of Exercise per Week: 7 days    Minutes of Exercise per Session: 20 min  Stress: Stress Concern Present (12/09/2021)   [place]    Feeling of Stress : Very much  Social Connections: Moderately Isolated (12/09/2021)   Social Connection and Isolation Panel [NHANES]    Frequency of Communication with Friends and Family: Never    Frequency of Social Gatherings with Friends and Family: Once a week    Attends Religious Services: More than 4 times per year    Active Member of [employer] or Organizations: No    Attends Archivist Meetings: Never    Marital Status: Married     Family History: The patient's family history includes Breast cancer in her paternal aunt; Cancer in her brother and mother; Cancer (age of onset: 27) in her sister; Cancer (age of onset: 43) in her father; Depression in her sister; Heart attack (age of onset: 57) in her father; Heart disease in her father; Hyperlipidemia in her father; Hypertension in her father. There is no history of Migraines or Thyroid cancer.  ROS:   Please see the history of present illness.    All other systems reviewed and are negative.  EKGs/Labs/Other Studies Reviewed:    The following studies were reviewed today:  05/2022 echo - EF55, RV normal, mild-mod MR  05/09/2022 ECG shows AF 11/08/2021 ECG shows sinus bradycardia  EKG:  The ekg ordered today demonstrates atrial fibrillation with a slow ventricular response at 55 bpm.   Recent Labs: 07/27/2022: ALT 18; BUN 19; Creatinine, Ser 0.87; Hemoglobin 14.4; Platelets 189; Potassium 3.7; Sodium 139  Recent Lipid Panel    Component Value Date/Time   CHOL 192 03/11/2022 1110   TRIG 114.0 03/11/2022 1110   HDL 60.50 03/11/2022 1110   CHOLHDL 3 03/11/2022  1110   VLDL 22.8 03/11/2022 1110   LDLCALC 109 (H) 03/11/2022 1110    Physical Exam:    VS:  BP 118/64   Pulse (!) 55   Ht '5\' 5"'$  (1.651 m)   Wt 267 lb (121.1 kg)   SpO2 98%   BMI 44.43 kg/m     Wt Readings from Last 3 Encounters:  08/10/22 267 lb (121.1 kg)  08/10/22 264 lb 3.2 oz (119.8 kg)  07/28/22 259 lb (117.5 kg)     GEN:  Well nourished, well developed in no acute distress.  Obese CARDIAC: Irregularly irregular, no murmurs, rubs, gallops RESPIRATORY:  Clear to auscultation without rales, wheezing or rhonchi  PSYCHIATRIC:  Normal affect       ASSESSMENT:    1. Persistent atrial fibrillation ([place])   2. Encounter for long-term (current) use of high-risk medication   3. Chronic diastolic CHF (congestive heart failure) (HCC)    PLAN:    In order of problems listed above:   #Persistent AF Symptomatic episodes despite treatment with flecainide.  Discussed treatment options today for their AF including antiarrhythmic drug therapy and ablation. Discussed risks, recovery and likelihood of success. Discussed potential need for repeat ablation procedures and antiarrhythmic drugs after an initial ablation. They wish to proceed with scheduling.  Risk, benefits, and alternatives to EP study and radiofrequency ablation for afib were also discussed in detail today. These risks include but are not limited to stroke, bleeding, vascular damage, tamponade, perforation, damage to the esophagus,  lungs, and other structures, pulmonary vein stenosis, worsening renal function, and death. The patient understands these risk and wishes to proceed.  We will therefore proceed with catheter ablation at the next available time.  Carto, ICE, anesthesia are requested for the procedure.  Will also obtain CT PV protocol prior to the procedure to exclude LAA thrombus and further evaluate atrial anatomy.  Continue Eliquis.  #HFpEF NYHA II. Warm and dry on exam. Continue current medical  therapy.      Medication Adjustments/Labs and Tests Ordered: Current medicines are reviewed at length with the patient today.  Concerns regarding medicines are outlined above.  No orders of the defined types were placed in this encounter.  No orders of the defined types were placed in this encounter.    Signed, Hilton Cork. Quentin Ore, MD, [facility], [facility] 08/10/2022 11:55 AM    Electrophysiology [facility]

## 2022-08-10 ENCOUNTER — Encounter: Payer: Self-pay | Admitting: Family

## 2022-08-10 ENCOUNTER — Encounter: Payer: Self-pay | Admitting: Cardiology

## 2022-08-10 ENCOUNTER — Other Ambulatory Visit: Payer: Self-pay | Admitting: Family

## 2022-08-10 ENCOUNTER — Ambulatory Visit: Payer: Medicare Other | Attending: Cardiology | Admitting: Cardiology

## 2022-08-10 ENCOUNTER — Ambulatory Visit (INDEPENDENT_AMBULATORY_CARE_PROVIDER_SITE_OTHER): Payer: Medicare Other | Admitting: Family

## 2022-08-10 VITALS — BP 138/78 | HR 77 | Temp 97.4°F | Ht 65.0 in | Wt 264.2 lb

## 2022-08-10 VITALS — BP 118/64 | HR 55 | Ht 65.0 in | Wt 267.0 lb

## 2022-08-10 DIAGNOSIS — R899 Unspecified abnormal finding in specimens from other organs, systems and tissues: Secondary | ICD-10-CM | POA: Diagnosis not present

## 2022-08-10 DIAGNOSIS — M9901 Segmental and somatic dysfunction of cervical region: Secondary | ICD-10-CM | POA: Diagnosis not present

## 2022-08-10 DIAGNOSIS — M542 Cervicalgia: Secondary | ICD-10-CM | POA: Diagnosis not present

## 2022-08-10 DIAGNOSIS — Z6841 Body Mass Index (BMI) 40.0 and over, adult: Secondary | ICD-10-CM

## 2022-08-10 DIAGNOSIS — I5032 Chronic diastolic (congestive) heart failure: Secondary | ICD-10-CM | POA: Diagnosis not present

## 2022-08-10 DIAGNOSIS — C44319 Basal cell carcinoma of skin of other parts of face: Secondary | ICD-10-CM | POA: Diagnosis not present

## 2022-08-10 DIAGNOSIS — N1330 Unspecified hydronephrosis: Secondary | ICD-10-CM | POA: Diagnosis not present

## 2022-08-10 DIAGNOSIS — E669 Obesity, unspecified: Secondary | ICD-10-CM | POA: Diagnosis not present

## 2022-08-10 DIAGNOSIS — I4819 Other persistent atrial fibrillation: Secondary | ICD-10-CM

## 2022-08-10 DIAGNOSIS — G43001 Migraine without aura, not intractable, with status migrainosus: Secondary | ICD-10-CM | POA: Diagnosis not present

## 2022-08-10 DIAGNOSIS — Z79899 Other long term (current) drug therapy: Secondary | ICD-10-CM

## 2022-08-10 DIAGNOSIS — C4491 Basal cell carcinoma of skin, unspecified: Secondary | ICD-10-CM | POA: Insufficient documentation

## 2022-08-10 DIAGNOSIS — F32A Depression, unspecified: Secondary | ICD-10-CM

## 2022-08-10 DIAGNOSIS — M9903 Segmental and somatic dysfunction of lumbar region: Secondary | ICD-10-CM | POA: Diagnosis not present

## 2022-08-10 DIAGNOSIS — F419 Anxiety disorder, unspecified: Secondary | ICD-10-CM

## 2022-08-10 LAB — BASIC METABOLIC PANEL
BUN: 16 mg/dL (ref 6–23)
CO2: 31 mEq/L (ref 19–32)
Calcium: 9 mg/dL (ref 8.4–10.5)
Chloride: 101 mEq/L (ref 96–112)
Creatinine, Ser: 0.91 mg/dL (ref 0.40–1.20)
GFR: 64.29 mL/min (ref >60.00)
Glucose, Bld: 90 mg/dL (ref 70–99)
Potassium: 3.6 mEq/L (ref 3.5–5.1)
Sodium: 140 mEq/L (ref 135–145)

## 2022-08-10 MED ORDER — SAXENDA 18 MG/3ML ~~LOC~~ SOPN: 0.6000 mg | PEN_INJECTOR | Freq: Every day | SUBCUTANEOUS | 3 refills | Status: DC

## 2022-08-10 NOTE — Patient Instructions (Addendum)
I ordered ultrasound of kidneys.  Let us know if you dont hear back within a week in regards to an appointment being scheduled.   So that you are aware, if you are [facility] MyChart user , please pay attention to your MyChart messages as you may receive a MyChart message with a phone number to call and schedule this test/appointment own your own from our referral coordinator. This is a new process so I do not want you to miss this message.  If you are not a MyChart user, you will receive a phone call.   Start Saxenda 0.'6mg'$  subcutaneously ([place]) ; you may take this injection daily and will gradually increase as tolerated to maximum dose of '3mg'$  injected subcutaneous daily. You do not have to increase each week, you can go weeks without increasing.  If you do increase, you do this by increasing dose 0.'6mg'$  once per week.  For example: Week one : 0.'6mg'$  [place] daily Week two: 1.'2mg'$  [place] daily  Week three: 1.8 mg [place] daily Week four: 2.'4mg'$  [place] daily Week five: '3mg'$   daily AND STAY THERE.  Remember you may NOT take this medication if ANY personal or family history of thyroid cancer so please let us know asap if this history were to change.

## 2022-08-10 NOTE — Patient Instructions (Addendum)
Medication Instructions:  Your physician recommends that you continue on your current medications as directed. Please refer to the Current Medication list given to you today.  *If you need a refill on your cardiac medications before your next appointment, please call your pharmacy*  Lab Work: BMET and CBC prior to your CT scan You will get your lab work at [facility]) hospital.  Your lab work will be done at the [place] next to [employer]. These are walk in labs- you will not need an appointment and you do not need to be fasting.    Testing/Procedures: Cardiac CT scan: we will call you to schedule (this will be done about a week prior to your ablation) Ablation: Tuesday May 28th at 10:30am. Please arrive at [facility], Main Entrance A at 8:30am  Follow-Up: At [facility], you and your health needs are our priority.  As part of our continuing mission to provide you with exceptional heart care, we have created designated Provider Care Teams.  These Care Teams include your primary Cardiologist (physician) and Advanced Practice Providers (APPs -  Physician Assistants and Nurse Practitioners) who all work together to provide you with the care you need, when you need it.  Your next appointment:   We will call you to arrange your follow up appointments.

## 2022-08-10 NOTE — Assessment & Plan Note (Signed)
Chronic, stable.  Continue Prozac 20 mg, BuSpar 5 mg 2 times daily, Xanax 0.25 mg twice daily as needed.

## 2022-08-10 NOTE — Progress Notes (Signed)
Assessment & Plan:  BMI 50.0-59.9, adult ([place]) -     Saxenda; Inject 0.6 mg into the skin daily.  Dispense: 9 mL; Refill: 3  Abnormal laboratory test -     Basic metabolic panel -     PTH, Intact (ICMA) and Ionized Calcium  Hydroureteronephrosis -     US RENAL; Future  Basal cell carcinoma (BCC) of skin of other part of face  Obesity (BMI 35.0-39.9 without comorbidity) Assessment & Plan: Trial of Saxenda.  Counseled patient on blackbox warning as it relates to medullary thyroid cancer, multiple endocrine neoplasia.  Counseled on side effects, administration.   Anxiety and depression Assessment & Plan: Chronic, stable.  Continue Prozac 20 mg, BuSpar 5 mg 2 times daily, Xanax 0.25 mg twice daily as needed.       Return precautions given.   Risks, benefits, and alternatives of the medications and treatment plan prescribed today were discussed, and patient expressed understanding.   Education regarding symptom management and diagnosis given to patient on AVS either electronically or printed.  No follow-ups on file.  [name], FNP  Subjective:    Patient ID: Amber Barnes, female    DOB: [birth year], 72 y.o.   MRN: [id number]  CC: Amber Barnes is a 72 y.o. female who presents today for follow up.   HPI: Feels well today.  No new complaint   GAD- she feels well on regimen. She is taking xanax 0.'25mg'$  qhs which helps with sleep.  She will occasionally take 0.2 mg in the morning Compliant with buspar, prozac. She worries about her husband whom has MS.      She wasn't able to fill Saxenda due to backorder.   H/o half thyroid lobectomy secondary to a benign nodule  She continues to follow closely with Dr. Tasia Catchings for right breast cancer.  Last visit 07/27/22.  She no longer on maintenance chemotherapy  She has follow-up today with cardiology   Allergies: Prednisone, Amiodarone, Hydrocodone-acetaminophen, Oxycodone, Tapentadol, Tramadol, Adhesive [tape], and  Latex Current Outpatient Medications on File Prior to Visit  Medication Sig Dispense Refill   acetaminophen (TYLENOL) 500 MG tablet Take 500-1,000 mg by mouth every 6 (six) hours as needed for mild pain or moderate pain.     ALPRAZolam (XANAX) 0.25 MG tablet Take 1 tablet (0.25 mg total) by mouth 2 (two) times daily as needed for anxiety. 60 tablet 2   apixaban (ELIQUIS) 5 MG TABS tablet TAKE 1 TABLET BY MOUTH TWICE A DAY 180 tablet 1   bisoprolol (ZEBETA) 10 MG tablet Take 1 tablet (10 mg total) by mouth 2 (two) times daily. 120 tablet 3   busPIRone (BUSPAR) 5 MG tablet TAKE 1 TABLET BY MOUTH THREE TIMES A DAY AS NEEDED 60 tablet 1   Cholecalciferol (VITAMIN D3) 5000 units CAPS Take 5,000 Units by mouth daily.     clotrimazole (LOTRIMIN) 1 % cream Apply 1 application topically 2 (two) times daily. 30 g 0   diltiazem (CARDIZEM CD) 240 MG 24 hr capsule Take 240 mg by mouth as needed.     diphenhydrAMINE (BENADRYL) 25 mg capsule Take 1 capsule (25 mg total) by mouth every 8 (eight) hours as needed for itching or allergies. 30 capsule 0   esomeprazole (NEXIUM) 20 MG capsule Take 20 mg by mouth daily as needed (GERD symptoms).     flecainide (TAMBOCOR) 100 MG tablet Take 1 tablet (100 mg total) by mouth 2 (two) times daily. 180 tablet 3   FLUoxetine (PROZAC) 20  MG capsule Take 1 capsule (20 mg total) by mouth every morning. 90 capsule 3   furosemide (LASIX) 20 MG tablet TAKE 1 TABLET BY MOUTH TWICE A DAY 180 tablet 0   gabapentin (NEURONTIN) 100 MG capsule TAKE 1 CAPSULE (100 MG TOTAL) BY MOUTH 3 TIMES A DAY 270 capsule 0   KLOR-CON M20 20 MEQ tablet TAKE 1 TABLET BY MOUTH EVERY DAY 90 tablet 1   losartan-hydrochlorothiazide (HYZAAR) 50-12.5 MG tablet Take half tablet once a day. 30 tablet 1   meclizine (ANTIVERT) 12.5 MG tablet Take 1 tablet (12.5 mg total) by mouth 3 (three) times daily as needed for dizziness. 30 tablet 0   Multiple Vitamin (MULTIVITAMIN) tablet Take 1 tablet by mouth daily.      nystatin (MYCOSTATIN/NYSTOP) powder Apply 1 application. topically 3 (three) times daily. 60 g 11   nystatin cream (MYCOSTATIN) Apply 1 application. topically 2 (two) times daily. Prn abdomen 60 g 5   ondansetron (ZOFRAN) 8 MG tablet TAKE 1 TABLET BY MOUTH EVERY 8 HOURS AS NEEDED FOR NAUSEA OR VOMITING. 60 tablet 0   promethazine (PHENERGAN) 25 MG tablet Take 1 tablet (25 mg total) by mouth every 6 (six) hours as needed for nausea or vomiting. 60 tablet 0   ranolazine (RANEXA) 500 MG 12 hr tablet TAKE 1 TABLET BY MOUTH TWICE A DAY 180 tablet 2   rizatriptan (MAXALT) 10 MG tablet TAKE 1 TABLET (10 MG TOTAL) BY MOUTH AS NEEDED FOR MIGRAINE. MAY REPEAT IN 2 HOURS IF NEEDED 10 tablet 0   rOPINIRole (REQUIP) 2 MG tablet TAKE 1 TABLET BY MOUTH EVERYDAY AT BEDTIME 90 tablet 1   rosuvastatin (CRESTOR) 20 MG tablet TAKE 1 TABLET BY MOUTH EVERY DAY 90 tablet 1   rosuvastatin (CRESTOR) 40 MG tablet Take 1 tablet (40 mg total) by mouth daily. 90 tablet 3   [DISCONTINUED] prochlorperazine (COMPAZINE) 10 MG tablet Take 1 tablet (10 mg total) by mouth every 6 (six) hours as needed (Nausea or vomiting). 30 tablet 1   No current facility-administered medications on file prior to visit.    Review of Systems  Constitutional:  Negative for chills and fever.  Respiratory:  Negative for cough.   Cardiovascular:  Negative for chest pain and palpitations.  Gastrointestinal:  Negative for nausea and vomiting.      Objective:    BP 138/78   Pulse 77   Temp (!) 97.4 F (36.3 C) (Oral)   Ht '5\' 5"'$  (1.651 m)   Wt 264 lb 3.2 oz (119.8 kg)   SpO2 97%   BMI 43.97 kg/m  BP Readings from Last 3 Encounters:  08/10/22 138/78  07/28/22 (!) 162/92  07/27/22 (!) 155/112   Wt Readings from Last 3 Encounters:  08/10/22 264 lb 3.2 oz (119.8 kg)  07/28/22 259 lb (117.5 kg)  07/27/22 258 lb 6.4 oz (117.2 kg)    Physical Exam Vitals reviewed.  Constitutional:      Appearance: She is well-developed.  Eyes:      Conjunctiva/sclera: Conjunctivae normal.  Cardiovascular:     Rate and Rhythm: Normal rate and regular rhythm.     Pulses: Normal pulses.     Heart sounds: Normal heart sounds.  Pulmonary:     Effort: Pulmonary effort is normal.     Breath sounds: Normal breath sounds. No wheezing, rhonchi or rales.  Skin:    General: Skin is warm and dry.  Neurological:     Mental Status: She is alert.  Psychiatric:  Speech: Speech normal.        Behavior: Behavior normal.        Thought Content: Thought content normal.

## 2022-08-10 NOTE — Assessment & Plan Note (Signed)
Trial of Saxenda.  Counseled patient on blackbox warning as it relates to medullary thyroid cancer, multiple endocrine neoplasia.  Counseled on side effects, administration.

## 2022-08-11 ENCOUNTER — Encounter: Payer: Self-pay | Admitting: Oncology

## 2022-08-11 ENCOUNTER — Encounter: Payer: Self-pay | Admitting: Family

## 2022-08-11 ENCOUNTER — Other Ambulatory Visit (HOSPITAL_COMMUNITY): Payer: Self-pay

## 2022-08-11 DIAGNOSIS — M542 Cervicalgia: Secondary | ICD-10-CM | POA: Diagnosis not present

## 2022-08-11 DIAGNOSIS — G43001 Migraine without aura, not intractable, with status migrainosus: Secondary | ICD-10-CM | POA: Diagnosis not present

## 2022-08-11 DIAGNOSIS — M9901 Segmental and somatic dysfunction of cervical region: Secondary | ICD-10-CM | POA: Diagnosis not present

## 2022-08-11 DIAGNOSIS — M9903 Segmental and somatic dysfunction of lumbar region: Secondary | ICD-10-CM | POA: Diagnosis not present

## 2022-08-11 NOTE — Telephone Encounter (Signed)
PA Saxenda 

## 2022-08-11 NOTE — Telephone Encounter (Signed)
Patient Advocate Encounter   Received notification from [employer] that prior authorization for Saxenda is required.   PA submitted on 08/11/2022 Key [id number] Status is pending

## 2022-08-12 DIAGNOSIS — M9901 Segmental and somatic dysfunction of cervical region: Secondary | ICD-10-CM | POA: Diagnosis not present

## 2022-08-12 DIAGNOSIS — G43001 Migraine without aura, not intractable, with status migrainosus: Secondary | ICD-10-CM | POA: Diagnosis not present

## 2022-08-12 DIAGNOSIS — M542 Cervicalgia: Secondary | ICD-10-CM | POA: Diagnosis not present

## 2022-08-12 DIAGNOSIS — M9903 Segmental and somatic dysfunction of lumbar region: Secondary | ICD-10-CM | POA: Diagnosis not present

## 2022-08-14 LAB — PTH, INTACT (ICMA) AND IONIZED CALCIUM
Calcium: 8.3 mg/dL — ABNORMAL LOW (ref 8.6–10.4)
PTH: 51 pg/mL (ref 16–77)

## 2022-08-14 LAB — EXTRA SPECIMEN

## 2022-08-16 DIAGNOSIS — M542 Cervicalgia: Secondary | ICD-10-CM | POA: Diagnosis not present

## 2022-08-16 DIAGNOSIS — G43001 Migraine without aura, not intractable, with status migrainosus: Secondary | ICD-10-CM | POA: Diagnosis not present

## 2022-08-16 DIAGNOSIS — M9901 Segmental and somatic dysfunction of cervical region: Secondary | ICD-10-CM | POA: Diagnosis not present

## 2022-08-16 DIAGNOSIS — M9903 Segmental and somatic dysfunction of lumbar region: Secondary | ICD-10-CM | POA: Diagnosis not present

## 2022-08-17 ENCOUNTER — Ambulatory Visit
Admission: RE | Admit: 2022-08-17 | Discharge: 2022-08-17 | Disposition: A | Discharge status: Home / Self Care | Payer: Medicare Other | Source: Ambulatory Visit | Attending: Family | Admitting: Family

## 2022-08-17 DIAGNOSIS — N1330 Unspecified hydronephrosis: Secondary | ICD-10-CM | POA: Insufficient documentation

## 2022-08-17 DIAGNOSIS — N281 Cyst of kidney, acquired: Secondary | ICD-10-CM | POA: Diagnosis not present

## 2022-08-17 NOTE — Telephone Encounter (Signed)
Pharmacy Patient Advocate Encounter  Received notification from [employer] that the request for prior authorization for Saxenda has been denied due to .    Please be advised we currently do not have a Pharmacist to review denials, therefore you will need to process appeals accordingly as needed. Thanks for your support at this time.   You may call [phone] to appeal.

## 2022-08-18 ENCOUNTER — Other Ambulatory Visit: Payer: Self-pay | Admitting: Family

## 2022-08-18 DIAGNOSIS — M542 Cervicalgia: Secondary | ICD-10-CM | POA: Diagnosis not present

## 2022-08-18 DIAGNOSIS — G43001 Migraine without aura, not intractable, with status migrainosus: Secondary | ICD-10-CM | POA: Diagnosis not present

## 2022-08-18 DIAGNOSIS — M9901 Segmental and somatic dysfunction of cervical region: Secondary | ICD-10-CM | POA: Diagnosis not present

## 2022-08-18 DIAGNOSIS — Z6841 Body Mass Index (BMI) 40.0 and over, adult: Secondary | ICD-10-CM

## 2022-08-18 DIAGNOSIS — M9903 Segmental and somatic dysfunction of lumbar region: Secondary | ICD-10-CM | POA: Diagnosis not present

## 2022-08-18 MED ORDER — METFORMIN HCL ER 500 MG PO TB24: 500.0000 mg | ORAL_TABLET | Freq: Every evening | ORAL | 2 refills | Status: DC

## 2022-08-18 NOTE — Telephone Encounter (Signed)
Spoke to pt and informed her that the PA for [place] had been denied. Pt stated that she would like to maybe try something else.

## 2022-08-22 DIAGNOSIS — M542 Cervicalgia: Secondary | ICD-10-CM | POA: Diagnosis not present

## 2022-08-22 DIAGNOSIS — M9903 Segmental and somatic dysfunction of lumbar region: Secondary | ICD-10-CM | POA: Diagnosis not present

## 2022-08-22 DIAGNOSIS — G43001 Migraine without aura, not intractable, with status migrainosus: Secondary | ICD-10-CM | POA: Diagnosis not present

## 2022-08-22 DIAGNOSIS — M9901 Segmental and somatic dysfunction of cervical region: Secondary | ICD-10-CM | POA: Diagnosis not present

## 2022-08-24 DIAGNOSIS — M9901 Segmental and somatic dysfunction of cervical region: Secondary | ICD-10-CM | POA: Diagnosis not present

## 2022-08-24 DIAGNOSIS — M9903 Segmental and somatic dysfunction of lumbar region: Secondary | ICD-10-CM | POA: Diagnosis not present

## 2022-08-24 DIAGNOSIS — M542 Cervicalgia: Secondary | ICD-10-CM | POA: Diagnosis not present

## 2022-08-24 DIAGNOSIS — G43001 Migraine without aura, not intractable, with status migrainosus: Secondary | ICD-10-CM | POA: Diagnosis not present

## 2022-08-29 DIAGNOSIS — G43001 Migraine without aura, not intractable, with status migrainosus: Secondary | ICD-10-CM | POA: Diagnosis not present

## 2022-08-29 DIAGNOSIS — M9903 Segmental and somatic dysfunction of lumbar region: Secondary | ICD-10-CM | POA: Diagnosis not present

## 2022-08-29 DIAGNOSIS — M9901 Segmental and somatic dysfunction of cervical region: Secondary | ICD-10-CM | POA: Diagnosis not present

## 2022-08-29 DIAGNOSIS — M542 Cervicalgia: Secondary | ICD-10-CM | POA: Diagnosis not present

## 2022-09-01 DIAGNOSIS — M9903 Segmental and somatic dysfunction of lumbar region: Secondary | ICD-10-CM | POA: Diagnosis not present

## 2022-09-01 DIAGNOSIS — M9901 Segmental and somatic dysfunction of cervical region: Secondary | ICD-10-CM | POA: Diagnosis not present

## 2022-09-01 DIAGNOSIS — G43001 Migraine without aura, not intractable, with status migrainosus: Secondary | ICD-10-CM | POA: Diagnosis not present

## 2022-09-01 DIAGNOSIS — M542 Cervicalgia: Secondary | ICD-10-CM | POA: Diagnosis not present

## 2022-09-06 ENCOUNTER — Other Ambulatory Visit
Admission: RE | Admit: 2022-09-06 | Discharge: 2022-09-06 | Disposition: A | Discharge status: Home / Self Care | Payer: Medicare Other | Source: Ambulatory Visit | Attending: Cardiology | Admitting: Cardiology

## 2022-09-06 ENCOUNTER — Ambulatory Visit: Payer: Medicare Other | Attending: Cardiology | Admitting: Cardiology

## 2022-09-06 VITALS — BP 131/88 | HR 86 | Ht 65.0 in | Wt 261.0 lb

## 2022-09-06 DIAGNOSIS — M9901 Segmental and somatic dysfunction of cervical region: Secondary | ICD-10-CM | POA: Diagnosis not present

## 2022-09-06 DIAGNOSIS — I4891 Unspecified atrial fibrillation: Secondary | ICD-10-CM

## 2022-09-06 DIAGNOSIS — M9903 Segmental and somatic dysfunction of lumbar region: Secondary | ICD-10-CM | POA: Diagnosis not present

## 2022-09-06 DIAGNOSIS — M542 Cervicalgia: Secondary | ICD-10-CM | POA: Diagnosis not present

## 2022-09-06 DIAGNOSIS — C50411 Malignant neoplasm of upper-outer quadrant of right female breast: Secondary | ICD-10-CM | POA: Diagnosis not present

## 2022-09-06 DIAGNOSIS — E785 Hyperlipidemia, unspecified: Secondary | ICD-10-CM | POA: Diagnosis not present

## 2022-09-06 DIAGNOSIS — I4892 Unspecified atrial flutter: Secondary | ICD-10-CM

## 2022-09-06 DIAGNOSIS — I4819 Other persistent atrial fibrillation: Secondary | ICD-10-CM

## 2022-09-06 DIAGNOSIS — E876 Hypokalemia: Secondary | ICD-10-CM

## 2022-09-06 DIAGNOSIS — I5032 Chronic diastolic (congestive) heart failure: Secondary | ICD-10-CM | POA: Diagnosis not present

## 2022-09-06 DIAGNOSIS — R0609 Other forms of dyspnea: Secondary | ICD-10-CM | POA: Diagnosis not present

## 2022-09-06 DIAGNOSIS — Z171 Estrogen receptor negative status [ER-]: Secondary | ICD-10-CM | POA: Diagnosis not present

## 2022-09-06 DIAGNOSIS — G43001 Migraine without aura, not intractable, with status migrainosus: Secondary | ICD-10-CM | POA: Diagnosis not present

## 2022-09-06 DIAGNOSIS — I10 Essential (primary) hypertension: Secondary | ICD-10-CM | POA: Diagnosis not present

## 2022-09-06 LAB — CBC
HCT: 42.5 % (ref 36.0–46.0)
Hemoglobin: 14.2 g/dL (ref 12.0–15.0)
MCH: 29.9 pg (ref 26.0–34.0)
MCHC: 33.4 g/dL (ref 30.0–36.0)
MCV: 89.5 fL (ref 80.0–100.0)
Platelets: 182 10*3/uL (ref 150–400)
RBC: 4.75 MIL/uL (ref 3.87–5.11)
RDW: 12.8 % (ref 11.5–15.5)
WBC: 7.1 10*3/uL (ref 4.0–10.5)
nRBC: 0 % (ref 0.0–0.2)

## 2022-09-06 LAB — BASIC METABOLIC PANEL
Anion gap: 13 (ref 5–15)
BUN: 19 mg/dL (ref 8–23)
CO2: 26 mmol/L (ref 22–32)
Calcium: 9.1 mg/dL (ref 8.9–10.3)
Chloride: 100 mmol/L (ref 98–111)
Creatinine, Ser: 1 mg/dL (ref 0.44–1.00)
GFR, Estimated: >60 mL/min (ref >60)
Glucose, Bld: 93 mg/dL (ref 70–99)
Potassium: 3.5 mmol/L (ref 3.5–5.1)
Sodium: 139 mmol/L (ref 135–145)

## 2022-09-06 LAB — TSH: TSH: 4.653 u[IU]/mL — ABNORMAL HIGH (ref 0.350–4.500)

## 2022-09-06 NOTE — Patient Instructions (Signed)
Medication Instructions:  No changes at this time.   *If you need a refill on your cardiac medications before your next appointment, please call your pharmacy*   Lab Work: CBC, BMP & TSH. Go to [facility] and check in at registration desk.   If you have labs (blood work) drawn today and your tests are completely normal, you will receive your results only by: [place] (if you have MyChart) OR A paper copy in the mail If you have any lab test that is abnormal or we need to change your treatment, we will call you to review the results.   Testing/Procedures: Your physician has requested that you have an echocardiogram. Echocardiography is a painless test that uses sound waves to create images of your heart. It provides your doctor with information about the size and shape of your heart and how well your heart's chambers and valves are working. This procedure takes approximately one hour. There are no restrictions for this procedure. Please do NOT wear cologne, perfume, aftershave, or lotions (deodorant is allowed). Please arrive 15 minutes prior to your appointment time.    Follow-Up: At [facility], you and your health needs are our priority.  As part of our continuing mission to provide you with exceptional heart care, we have created designated Provider Care Teams.  These Care Teams include your primary Cardiologist (physician) and Advanced Practice Providers (APPs -  Physician Assistants and Nurse Practitioners) who all work together to provide you with the care you need, when you need it.   Your next appointment:   Follow up after echocardiogram.   Provider:   Ida Rogue, MD or Gerrie Nordmann, NP

## 2022-09-06 NOTE — Progress Notes (Signed)
Cardiology Clinic Note   Patient Name: Amber Barnes Date of Encounter: 09/08/2022  Primary Care Provider:  [name], FNP Primary Cardiologist:  Ida Rogue, MD  Patient Profile    72 year old female with a past medical history of paroxysmal atrial fibrillation with cardioversion in 2018, hypertension, chronic diastolic congestive heart failure, obstructive sleep apnea, COPD, gastroesophageal reflux disease, fatty liver disease, neuropathy, CKD, morbid obesity, arthritis, hyperlipidemia, migraines, and recent history of breast cancer who has been on maintenance treatment, who returns to clinic today with weakness, labile blood pressures, and bodyaches related to her atrial fibrillation.  Past Medical History    Past Medical History:  Diagnosis Date   Achilles tendinitis    Acute medial meniscal tear    Allergy    Anxiety    Atrial fibrillation ([place])    a. new onset 07/2016; b. CHADS2VASc --> 4 (HTN, female, age, CHF); c. eliquis   Chemotherapy induced nausea and vomiting 05/18/2021   CHF (congestive heart failure) (HCC)    Chronic anticoagulation    Apixaban   Chronic lower back pain    Colon polyp    Complication of anesthesia    Emergence delirium; hypoxia   COPD (chronic obstructive pulmonary disease) ([place])    Cystic breast    Depression    Endometriosis    Fibrocystic breast disease    GERD (gastroesophageal reflux disease)    History of stress test    a. 07/2016 MV: EF 55-65%. Small, mild defect  in mid anteroseptal, apical septal, and apical locations. No ischemia-->low risk.   Hyperlipidemia    Hypertension    Insomnia    Lipoma    Malignant neoplasm of upper-outer quadrant of right female breast (HCC)    Stage 1A invasive mammary carcinoma (cT1b or T1c N0); ER (-), PR (+), HER2/neu (+)   Migraine    Mild Mitral regurgitation    a. 07/2016 Echo: EF 55-65%, no rwma, mild MR. Mildly dil LA. Nl RV fx.    Nephrolithiasis    OSA (obstructive sleep  apnea)    non-compliant with nocturnal PAP therapy   Osteoarthritis    left knee   Osteopenia    Peptic ulcer disease    a. H. pylori   Pneumonia    RLS (restless legs syndrome)    Tendonitis    Thyroid disease    Vitamin D deficiency    Past Surgical History:  Procedure Laterality Date   ABDOMINAL HYSTERECTOMY     ovaries left   ACHILLES TENDON SURGERY     2012,01/2017   BACK SURGERY     fusion lumbar   BREAST BIOPSY Right 01/15/2021   Affirm biopsy-"X" clip-INVASIVE MAMMARY CARCINOMA   BREAST BIOPSY Right 01/15/2021   u/s bx-"venus" clip INVASIVE MAMMARY CARCINOMA   BREAST BIOPSY     BREAST LUMPECTOMY,RADIO FREQ LOCALIZER,AXILLARY SENTINEL LYMPH NODE BIOPSY Right 02/17/2021   Procedure: BREAST LUMPECTOMY,RADIO FREQ LOCALIZER,AXILLARY SENTINEL LYMPH NODE BIOPSY;  Surgeon: Ronny Bacon, MD;  Location: [facility];  Service: General;  Laterality: Right;   CARDIOVERSION N/A 09/07/2016   Procedure: CARDIOVERSION;  Surgeon: Minna Merritts, MD;  Location: [facility];  Service: Cardiovascular;  Laterality: N/A;   CARDIOVERSION N/A 10/19/2016   Procedure: CARDIOVERSION;  Surgeon: Minna Merritts, MD;  Location: [facility];  Service: Cardiovascular;  Laterality: N/A;   CARDIOVERSION N/A 12/14/2016   Procedure: CARDIOVERSION;  Surgeon: Minna Merritts, MD;  Location: [facility];  Service: Cardiovascular;  Laterality: N/A;   COLONOSCOPY  COLONOSCOPY WITH PROPOFOL N/A 05/13/2020   Procedure: COLONOSCOPY WITH PROPOFOL;  Surgeon: Toledo, Benay Pike, MD;  Location: [facility];  Service: Gastroenterology;  Laterality: N/A;  ELIQUIS   CYSTOSCOPY WITH STENT PLACEMENT Bilateral 07/12/2021   Procedure: CYSTOSCOPY WITH STENT PLACEMENT;  Surgeon: Billey Co, MD;  Location: [facility];  Service: Urology;  Laterality: Bilateral;   CYSTOSCOPY/URETEROSCOPY/HOLMIUM LASER/STENT PLACEMENT Bilateral 07/30/2021   Procedure: CYSTOSCOPY/URETEROSCOPY/HOLMIUM LASER/BILATERAL STENT REMOVAL;  Surgeon:  Billey Co, MD;  Location: [facility];  Service: Urology;  Laterality: Bilateral;   ESOPHAGOGASTRODUODENOSCOPY     ESOPHAGOGASTRODUODENOSCOPY (EGD) WITH PROPOFOL N/A 05/13/2020   Procedure: ESOPHAGOGASTRODUODENOSCOPY (EGD) WITH PROPOFOL;  Surgeon: Toledo, Benay Pike, MD;  Location: [facility];  Service: Gastroenterology;  Laterality: N/A;   FOOT SURGERY Left    tendon   JOINT REPLACEMENT Right    hip   PORTA CATH INSERTION N/A 03/23/2021   Procedure: PORTA CATH INSERTION;  Surgeon: Katha Cabal, MD;  Location: [facility];  Service: Cardiovascular;  Laterality: N/A;   PORTACATH PLACEMENT N/A 03/23/2021   Procedure: ATTEMPTED INSERTION PORT-A-CATH;  Surgeon: Ronny Bacon, MD;  Location: [facility];  Service: General;  Laterality: N/A;   THYROIDECTOMY  1990   half thyroid lobectomy secondary to a benign nodule   TOTAL HIP ARTHROPLASTY Right 08/21/2018   Procedure: TOTAL HIP ARTHROPLASTY ANTERIOR APPROACH-RIGHT CELL SAVER REQUESTED;  Surgeon: [name], MD;  Location: [facility];  Service: Orthopedics;  Laterality: Right;    Allergies  Allergies  Allergen Reactions   Prednisone Other (See Comments)    Agitation, hyperactivity, polyphagia   Amiodarone Nausea And Vomiting and Other (See Comments)   Hydrocodone-Acetaminophen Other (See Comments)    Hallucinations and combative   Oxycodone Itching    Can take it with benadryl   Tapentadol Itching   Tramadol Itching    Can take it with benadryl   Adhesive [Tape] Itching and Rash    Prefers paper tape   Latex Itching and Rash    use paper tap    History of Present Illness    Amber Barnes is a 72 year old female with the previously mentioned past medical history.  She had undergone a first cardioversion for paroxysmal atrial fibrillation in 2018.  She did go into sinus rhythm but did not hold off flecainide, she was then changed to amiodarone and was cardioverted to normal sinus rhythm, unfortunately she  reverted back to atrial fibrillation and a flutter.  She had stress testing that was completed in January 2018 showing no ischemia, ejection fraction 55-65%, she was also diagnosed with breast cancer during treatments per oncology was she was to have repeat echocardiogram every 3 months and continued to have a normal EF.  She was hospitalized in early January 2023 for 6 days of IV antibiotic for kidney infection she had acute pyelonephritis with a ureterovesicular junction obstruction and right hydroureteronephrosis, which at that time she was seen and treated by Urology with bilateral ureteral stent placement. Eliquis had to be held due to hematuria.  She was able to be discharged home 07/17/2021.  Repeat echocardiogram in 07/2021 revealed LVEF of 60%, repeat completed 11/26/2021 revealed EF of 55-60%, again in 02/25/2022 she was 60 to 65%, her last study was 05/31/2022 when she was still 55 to 60%.  She did undergo exercise tolerance testing no ST deviation was noted prior study none available for comparison it was inconclusive stress test due to the lack of heart rate augmentation hypertensive exercise response and no ST  changes or ectopy noted during the study.   She continues to remain symptomatic with her atrial fibrillation atrial flutter and was last seen in clinic 07/31/2022 by Dr. Quentin Ore.  With her continued symptoms related to her atrial fibrillation with worsening fatigue and shortness of breath despite being treated with flecainide and diltiazem she was scheduled for coronary CTA and ablation procedure to take place in May 2024.  She returns to clinic today with complaints of fatigue, weakness, palpitations, related to being in atrial fibrillation or atrial flutter.  She states that she wants to know if her ablation procedure can be moved up sooner as she does not think that she can wait until May to have her procedure done.  She has also taken herself off of her diltiazem at this point stating that she  did not feel as though the medication was doing any good.  She has positive for palpitations, shortness of breath, peripheral edema, fatigue, and being achy and tired.  She denies chest pain, chest pressure, lightheadedness or dizziness.  She denies any recent hospitalizations or visits to the emergency department.  Home Medications    Current Outpatient Medications  Medication Sig Dispense Refill   ALPRAZolam (XANAX) 0.25 MG tablet Take 1 tablet (0.25 mg total) by mouth 2 (two) times daily as needed for anxiety. 60 tablet 2   apixaban (ELIQUIS) 5 MG TABS tablet TAKE 1 TABLET BY MOUTH TWICE A DAY 180 tablet 1   bisoprolol (ZEBETA) 10 MG tablet Take 1 tablet (10 mg total) by mouth 2 (two) times daily. 120 tablet 3   busPIRone (BUSPAR) 5 MG tablet TAKE 1 TABLET BY MOUTH THREE TIMES A DAY AS NEEDED 60 tablet 1   Cholecalciferol (VITAMIN D3) 5000 units CAPS Take 5,000 Units by mouth daily.     clotrimazole (LOTRIMIN) 1 % cream Apply 1 application topically 2 (two) times daily. 30 g 0   esomeprazole (NEXIUM) 20 MG capsule Take 20 mg by mouth daily as needed (GERD symptoms).     flecainide (TAMBOCOR) 100 MG tablet Take 1 tablet (100 mg total) by mouth 2 (two) times daily. 180 tablet 3   FLUoxetine (PROZAC) 20 MG capsule Take 1 capsule (20 mg total) by mouth every morning. 90 capsule 3   furosemide (LASIX) 20 MG tablet TAKE 1 TABLET BY MOUTH TWICE A DAY 180 tablet 0   gabapentin (NEURONTIN) 100 MG capsule TAKE 1 CAPSULE (100 MG TOTAL) BY MOUTH 3 TIMES A DAY 270 capsule 0   KLOR-CON M20 20 MEQ tablet TAKE 1 TABLET BY MOUTH EVERY DAY 90 tablet 1   losartan-hydrochlorothiazide (HYZAAR) 50-12.5 MG tablet Take half tablet once a day. 30 tablet 1   metFORMIN (GLUCOPHAGE-XR) 500 MG 24 hr tablet Take 1 tablet (500 mg total) by mouth every evening. 90 tablet 2   Multiple Vitamin (MULTIVITAMIN) tablet Take 1 tablet by mouth daily.     nystatin cream (MYCOSTATIN) Apply 1 application. topically 2 (two) times  daily. Prn abdomen 60 g 5   ranolazine (RANEXA) 500 MG 12 hr tablet TAKE 1 TABLET BY MOUTH TWICE A DAY 180 tablet 2   rizatriptan (MAXALT) 10 MG tablet TAKE 1 TABLET (10 MG TOTAL) BY MOUTH AS NEEDED FOR MIGRAINE. MAY REPEAT IN 2 HOURS IF NEEDED 10 tablet 0   rOPINIRole (REQUIP) 2 MG tablet TAKE 1 TABLET BY MOUTH EVERYDAY AT BEDTIME 90 tablet 1   rosuvastatin (CRESTOR) 40 MG tablet Take 1 tablet (40 mg total) by mouth daily. 90 tablet  3   acetaminophen (TYLENOL) 500 MG tablet Take 500-1,000 mg by mouth every 6 (six) hours as needed for mild pain or moderate pain. (Patient not taking: Reported on 09/06/2022)     diltiazem (CARDIZEM CD) 240 MG 24 hr capsule Take 240 mg by mouth as needed. (Patient not taking: Reported on 09/06/2022)     diphenhydrAMINE (BENADRYL) 25 mg capsule Take 1 capsule (25 mg total) by mouth every 8 (eight) hours as needed for itching or allergies. (Patient not taking: Reported on 09/06/2022) 30 capsule 0   meclizine (ANTIVERT) 12.5 MG tablet Take 1 tablet (12.5 mg total) by mouth 3 (three) times daily as needed for dizziness. (Patient not taking: Reported on 09/06/2022) 30 tablet 0   nystatin (MYCOSTATIN/NYSTOP) powder Apply 1 application. topically 3 (three) times daily. (Patient not taking: Reported on 09/06/2022) 60 g 11   ondansetron (ZOFRAN) 8 MG tablet TAKE 1 TABLET BY MOUTH EVERY 8 HOURS AS NEEDED FOR NAUSEA OR VOMITING. (Patient not taking: Reported on 09/06/2022) 60 tablet 0   promethazine (PHENERGAN) 25 MG tablet Take 1 tablet (25 mg total) by mouth every 6 (six) hours as needed for nausea or vomiting. (Patient not taking: Reported on 09/06/2022) 60 tablet 0   rosuvastatin (CRESTOR) 20 MG tablet TAKE 1 TABLET BY MOUTH EVERY DAY (Patient not taking: Reported on 09/06/2022) 90 tablet 1   No current facility-administered medications for this visit.     Family History    Family History  Problem Relation Age of Onset   Cancer Mother        leukemia   Cancer Father 26        colon   Heart disease Father    Heart attack Father 16       died from MI   Hyperlipidemia Father    Hypertension Father    Cancer Sister 77       ovarian   Depression Sister    Cancer Brother        CLL   Breast cancer Paternal Aunt        30's   Migraines Neg Hx    Thyroid cancer Neg Hx    She indicated that her mother is deceased. She indicated that her father is deceased. She indicated that her sister is alive. She indicated that both of her brothers are alive. She indicated that the status of her paternal aunt is unknown. She indicated that the status of her neg hx is unknown.  Social History    Social History   Socioeconomic History   Marital status: Married    Spouse name: jeffrey   Number of children: 1   Years of education: Not on file   Highest education level: High school graduate  Occupational History    Comment: disabled  Tobacco Use   Smoking status: Former    Packs/day: 0.25    Years: 10.00    Total pack years: 2.50    Types: Cigarettes    Quit date: 2005    Years since quitting: 19.1   Smokeless tobacco: Never   Tobacco comments:    smoked for 10 years, 1 pack every 2-3 days  Vaping Use   Vaping Use: Never used  Substance and Sexual Activity   Alcohol use: Not Currently    Alcohol/week: 1.0 standard drink of alcohol    Types: 1 Glasses of wine per week    Comment: rarely   Drug use: No   Sexual activity: Not Currently  Other Topics  Concern   Not on file  Social History Narrative   Moved to [place] from [place], originally from [place].    1 cup of caffeine daily   Right handed   Lives with husband, step-daughter and child       Social Determinants of Health   Financial Resource Strain: Low Risk  (12/09/2021)   Overall Financial Resource Strain (CARDIA)    Difficulty of Paying Living Expenses: Not very hard  Food Insecurity: No Food Insecurity (12/09/2021)   Hunger Vital Sign    Worried About Running Out of Food in the  Last Year: Never true    Ran Out of Food in the Last Year: Never true  Transportation Needs: No Transportation Needs (12/09/2021)   PRAPARE - Hydrologist (Medical): No    Lack of Transportation (Non-Medical): No  Physical Activity: Insufficiently Active (12/09/2021)   Exercise Vital Sign    Days of Exercise per Week: 7 days    Minutes of Exercise per Session: 20 min  Stress: Stress Concern Present (12/09/2021)   [place]    Feeling of Stress : Very much  Social Connections: Moderately Isolated (12/09/2021)   Social Connection and Isolation Panel [NHANES]    Frequency of Communication with Friends and Family: Never    Frequency of Social Gatherings with Friends and Family: Once a week    Attends Religious Services: More than 4 times per year    Active Member of [employer] or Organizations: No    Attends Archivist Meetings: Never    Marital Status: Married  Human resources officer Violence: Not At Risk (12/09/2021)   Humiliation, Afraid, Rape, and Kick questionnaire    Fear of Current or Ex-Partner: No    Emotionally Abused: No    Physically Abused: No    Sexually Abused: No     Review of Systems    General:  No chills, fever, night sweats or weight changes. Endorses fatigue Cardiovascular:  No chest pain, endorses dyspnea on exertion, endorses edema, but denies orthopnea, palpitations, paroxysmal nocturnal dyspnea. Respiratory: No cough, endorses dyspnea Urologic: No hematuria, dysuria Abdominal:   No nausea, vomiting, diarrhea, bright red blood per rectum, melena, or hematemesis Neurologic:  No visual changes, wkns, changes in mental status. All other systems reviewed and are otherwise negative except as noted above.   Physical Exam    VS:  BP 131/88 (BP Location: Left Arm, Patient Position: Sitting, Cuff Size: Normal) Comment (BP Location): wrist  Pulse 86   Ht '5\' 5"'$  (1.651 m)   Wt 261  lb (118.4 kg)   SpO2 98%   BMI 43.43 kg/m  , BMI Body mass index is 43.43 kg/m.     GEN: Well nourished, well developed, in no acute distress. HEENT: normal. Neck: Supple, no JVD, carotid bruits, or masses. Cardiac: irregularly irregular, no murmurs, rubs, or gallops. No clubbing, cyanosis, 1+ edema to BLE.  Radials 2+/PT 2+ and equal bilaterally.  Respiratory:  Respirations regular and unlabored, clear to auscultation bilaterally. GI: Soft, nontender, obese, nondistended, BS + x 4. MS: no deformity or atrophy. Skin: warm and dry, no rash. Neuro:  Strength and sensation are intact. Psych: Normal affect.  Accessory Clinical Findings    ECG personally reviewed by me today-atrial flutter with AV block, LVH, left axis deviation- No acute changes  Lab Results  Component Value Date   WBC 7.1 09/06/2022   HGB 14.2 09/06/2022  HCT 42.5 09/06/2022   MCV 89.5 09/06/2022   PLT 182 09/06/2022   Lab Results  Component Value Date   CREATININE 1.00 09/06/2022   BUN 19 09/06/2022   NA 139 09/06/2022   K 3.5 09/06/2022   CL 100 09/06/2022   CO2 26 09/06/2022   Lab Results  Component Value Date   ALT 18 07/27/2022   AST 25 07/27/2022   ALKPHOS 84 07/27/2022   BILITOT 0.6 07/27/2022   Lab Results  Component Value Date   CHOL 192 03/11/2022   HDL 60.50 03/11/2022   LDLCALC 109 (H) 03/11/2022   TRIG 114.0 03/11/2022   CHOLHDL 3 03/11/2022    Lab Results  Component Value Date   HGBA1C 4.5 02/07/2022    Assessment & Plan   1.  Persistent atrial fibrillation noted to be in a flutter today on EKG.  Unfortunately patient stopped taking her diltiazem about a week ago as she stated that it was not making her feel any better.  She is rate controlled and is continued on flecainide 100 mg twice daily, bisoprolol 10 mg twice daily, and apixaban 5 mg twice daily for CHA2DS2-VASc score of at least 5.  For her symptoms related to the atrial flutter she can restart her diltiazem at the  previous scheduled dose to see if she comes out of the irregular rhythm and converts back to sinus.  She has been sent for CBC to rule out anemia as a [place] as she is on anticoagulant, BMP to reevaluate electrolytes and kidney function, and a TSH as she states that she has always been hypothyroid had a half of her thyroid removed but has not had to be on medication previously.  She has been encouraged to avoid triggers of dehydration, stress, worsening fatigue, and caffeine.  She has been encouraged to maintain all follow-up appointments with the EP keep her procedure for ablation as scheduled.  2.  Chronic diastolic CHF with a longstanding history of being symptomatic to her atrial fibrillation.  Her weight is down today 6 pounds from previous visit.  She has not had any problems with sleeping or appetite.  She does have some mild edema to her bilateral lower extremities.  She is continued on buspirone and furosemide.  Repeat echocardiogram has been ordered to reevaluate EF of since her treatments are complete.  3.  Hypertension with a blood pressure today of 131/88.  Has remained stable.  She is continued on losartan HCTZ 50/12.5 mg daily, buspirone, and furosemide.  She has been encouraged to continue to monitor her pressure at home and to notify us of any huge fluctuations in pressure.  We did discuss about her having blood pressure done with a wrist cuff where it is not as accurate on the wrist as well as home cuffs are not as accurate when she is in A-fib a flutter since that is an irregular heart rhythm the cuff has a hard time reading an accurate pulse and blood pressure.  4.  Hyperlipidemia with LDL of 1097/08/02.  She is on rosuvastatin 40 mg daily.  This continues to be managed by her PCP.  5.  History of HDR 2-positive carcinoma of the breast.  She just recently finished treatments in January 2024.  She has been scheduled for follow-up echocardiogram as she had a slight decrease in her EF of 5% in  her November 2023 echo.  This continues to be followed by her oncologist with every 66-monthechocardiograms recommended when she continues to  be on treatment.  6.  Disposition patient to return to clinic to see MD/APP in 6 to 8 weeks or sooner if needed for reevaluation of symptoms and to discuss echocardiogram results.  CHA2DS2-VASc Score = 5   This indicates a 7.2% annual risk of stroke. The patient's score is based upon: CHF History: 1 HTN History: 1 Diabetes History: 1 Stroke History: 0 Vascular Disease History: 0 Age Score: 1 Gender Score: 1       Tesslyn Baumert, NP 09/08/2022, 2:04 PM

## 2022-09-07 DIAGNOSIS — M1712 Unilateral primary osteoarthritis, left knee: Secondary | ICD-10-CM | POA: Diagnosis not present

## 2022-09-07 DIAGNOSIS — M25662 Stiffness of left knee, not elsewhere classified: Secondary | ICD-10-CM | POA: Diagnosis not present

## 2022-09-07 DIAGNOSIS — M25562 Pain in left knee: Secondary | ICD-10-CM | POA: Diagnosis not present

## 2022-09-07 DIAGNOSIS — R262 Difficulty in walking, not elsewhere classified: Secondary | ICD-10-CM | POA: Diagnosis not present

## 2022-09-08 ENCOUNTER — Encounter: Payer: Self-pay | Admitting: Cardiology

## 2022-09-08 DIAGNOSIS — M9901 Segmental and somatic dysfunction of cervical region: Secondary | ICD-10-CM | POA: Diagnosis not present

## 2022-09-08 DIAGNOSIS — G43001 Migraine without aura, not intractable, with status migrainosus: Secondary | ICD-10-CM | POA: Diagnosis not present

## 2022-09-08 DIAGNOSIS — M9903 Segmental and somatic dysfunction of lumbar region: Secondary | ICD-10-CM | POA: Diagnosis not present

## 2022-09-08 DIAGNOSIS — M542 Cervicalgia: Secondary | ICD-10-CM | POA: Diagnosis not present

## 2022-09-11 ENCOUNTER — Other Ambulatory Visit: Payer: Self-pay | Admitting: Cardiovascular Disease

## 2022-09-12 ENCOUNTER — Encounter: Payer: Self-pay | Admitting: Family

## 2022-09-12 ENCOUNTER — Ambulatory Visit (INDEPENDENT_AMBULATORY_CARE_PROVIDER_SITE_OTHER): Payer: Medicare Other | Admitting: Family

## 2022-09-12 VITALS — BP 126/82 | HR 99 | Temp 97.8°F | Ht 65.0 in | Wt 258.2 lb

## 2022-09-12 DIAGNOSIS — F32A Depression, unspecified: Secondary | ICD-10-CM

## 2022-09-12 DIAGNOSIS — E785 Hyperlipidemia, unspecified: Secondary | ICD-10-CM

## 2022-09-12 DIAGNOSIS — Z6841 Body Mass Index (BMI) 40.0 and over, adult: Secondary | ICD-10-CM

## 2022-09-12 DIAGNOSIS — F419 Anxiety disorder, unspecified: Secondary | ICD-10-CM

## 2022-09-12 DIAGNOSIS — G629 Polyneuropathy, unspecified: Secondary | ICD-10-CM | POA: Diagnosis not present

## 2022-09-12 DIAGNOSIS — R899 Unspecified abnormal finding in specimens from other organs, systems and tissues: Secondary | ICD-10-CM | POA: Diagnosis not present

## 2022-09-12 DIAGNOSIS — R7989 Other specified abnormal findings of blood chemistry: Secondary | ICD-10-CM | POA: Diagnosis not present

## 2022-09-12 LAB — T4, FREE: Free T4: 0.81 ng/dL (ref 0.60–1.60)

## 2022-09-12 LAB — T3, FREE: T3, Free: 2.6 pg/mL (ref 2.3–4.2)

## 2022-09-12 LAB — TSH: TSH: 3.4 u[IU]/mL (ref 0.35–5.50)

## 2022-09-12 MED ORDER — GABAPENTIN 100 MG PO CAPS: ORAL_CAPSULE | ORAL | 3 refills | Status: DC

## 2022-09-12 NOTE — Assessment & Plan Note (Signed)
Congratulated patient on 10 pound weight loss.  Continue metformin 1000 mg daily.

## 2022-09-12 NOTE — Progress Notes (Unsigned)
Assessment & Plan:  Hyperlipidemia, unspecified hyperlipidemia type     Return precautions given.   Risks, benefits, and alternatives of the medications and treatment plan prescribed today were discussed, and patient expressed understanding.   Education regarding symptom management and diagnosis given to patient on AVS either electronically or printed.  No follow-ups on file.  [name], FNP  Subjective:    Patient ID: Amber Barnes, female    DOB: [birth year], 72 y.o.   MRN: [id number]  CC: Amber Barnes is a 72 y.o. female who presents today for follow up.   HPI: She was unable to start saxenda due to insurance. She has been taking metformin '1000mg'$  qd. She has lost 10 lbs  Recent elevation TSH  Right half thyroid lobectomy secondary to a benign nodule in 1990.   No h/o radiation of the neck.   No trouble swallowing, voice changes.   H/o right breast cancer s/p chemo and radiation.    Planning ablation 12/06/2022.  Follow-up cardiology 09/06/2022 for persistent atrial fibrillation.  She remains compliant with flecainide 100 mg twice daily, this profile 2 mg twice daily and apixaban 5 mg twice daily.  Tried cymbalta, luvox, zoloft   Allergies: Prednisone, Amiodarone, Hydrocodone-acetaminophen, Oxycodone, Tapentadol, Tramadol, Adhesive [tape], and Latex Current Outpatient Medications on File Prior to Visit  Medication Sig Dispense Refill   acetaminophen (TYLENOL) 500 MG tablet Take 500-1,000 mg by mouth every 6 (six) hours as needed for mild pain or moderate pain.     ALPRAZolam (XANAX) 0.25 MG tablet Take 1 tablet (0.25 mg total) by mouth 2 (two) times daily as needed for anxiety. 60 tablet 2   bisoprolol (ZEBETA) 10 MG tablet Take 1 tablet (10 mg total) by mouth 2 (two) times daily. 120 tablet 3   busPIRone (BUSPAR) 5 MG tablet TAKE 1 TABLET BY MOUTH THREE TIMES A DAY AS NEEDED 60 tablet 1   Cholecalciferol (VITAMIN D3) 5000 units CAPS Take 5,000 Units by  mouth daily.     clotrimazole (LOTRIMIN) 1 % cream Apply 1 application topically 2 (two) times daily. 30 g 0   diltiazem (CARDIZEM CD) 240 MG 24 hr capsule Take 240 mg by mouth as needed.     diphenhydrAMINE (BENADRYL) 25 mg capsule Take 1 capsule (25 mg total) by mouth every 8 (eight) hours as needed for itching or allergies. 30 capsule 0   ELIQUIS 5 MG TABS tablet TAKE 1 TABLET BY MOUTH TWICE A DAY 180 tablet 1   esomeprazole (NEXIUM) 20 MG capsule Take 20 mg by mouth daily as needed (GERD symptoms).     flecainide (TAMBOCOR) 100 MG tablet Take 1 tablet (100 mg total) by mouth 2 (two) times daily. 180 tablet 3   FLUoxetine (PROZAC) 20 MG capsule Take 1 capsule (20 mg total) by mouth every morning. 90 capsule 3   furosemide (LASIX) 20 MG tablet TAKE 1 TABLET BY MOUTH TWICE A DAY 180 tablet 0   gabapentin (NEURONTIN) 100 MG capsule TAKE 1 CAPSULE (100 MG TOTAL) BY MOUTH 3 TIMES A DAY 270 capsule 0   KLOR-CON M20 20 MEQ tablet TAKE 1 TABLET BY MOUTH EVERY DAY 90 tablet 1   losartan-hydrochlorothiazide (HYZAAR) 50-12.5 MG tablet Take half tablet once a day. 30 tablet 1   meclizine (ANTIVERT) 12.5 MG tablet Take 1 tablet (12.5 mg total) by mouth 3 (three) times daily as needed for dizziness. 30 tablet 0   metFORMIN (GLUCOPHAGE-XR) 500 MG 24 hr tablet Take 1 tablet (500 mg total)  by mouth every evening. 90 tablet 2   Multiple Vitamin (MULTIVITAMIN) tablet Take 1 tablet by mouth daily.     nystatin cream (MYCOSTATIN) Apply 1 application. topically 2 (two) times daily. Prn abdomen 60 g 5   ondansetron (ZOFRAN) 8 MG tablet TAKE 1 TABLET BY MOUTH EVERY 8 HOURS AS NEEDED FOR NAUSEA OR VOMITING. 60 tablet 0   promethazine (PHENERGAN) 25 MG tablet Take 1 tablet (25 mg total) by mouth every 6 (six) hours as needed for nausea or vomiting. 60 tablet 0   ranolazine (RANEXA) 500 MG 12 hr tablet TAKE 1 TABLET BY MOUTH TWICE A DAY 180 tablet 2   rizatriptan (MAXALT) 10 MG tablet TAKE 1 TABLET (10 MG TOTAL) BY MOUTH  AS NEEDED FOR MIGRAINE. MAY REPEAT IN 2 HOURS IF NEEDED 10 tablet 0   rOPINIRole (REQUIP) 2 MG tablet TAKE 1 TABLET BY MOUTH EVERYDAY AT BEDTIME 90 tablet 1   rosuvastatin (CRESTOR) 40 MG tablet Take 1 tablet (40 mg total) by mouth daily. 90 tablet 3   [DISCONTINUED] prochlorperazine (COMPAZINE) 10 MG tablet Take 1 tablet (10 mg total) by mouth every 6 (six) hours as needed (Nausea or vomiting). 30 tablet 1   No current facility-administered medications on file prior to visit.    Review of Systems  Constitutional:  Negative for chills and fever.  HENT:  Negative for trouble swallowing and voice change.   Respiratory:  Negative for cough.   Cardiovascular:  Negative for chest pain and palpitations.  Gastrointestinal:  Negative for nausea and vomiting.      Objective:    BP 126/82   Pulse 99   Temp 97.8 F (36.6 C)   Ht '5\' 5"'$  (1.651 m)   Wt 258 lb 3.2 oz (117.1 kg)   SpO2 98%   BMI 42.97 kg/m  BP Readings from Last 3 Encounters:  09/12/22 126/82  09/06/22 131/88  08/10/22 118/64   Wt Readings from Last 3 Encounters:  09/12/22 258 lb 3.2 oz (117.1 kg)  09/06/22 261 lb (118.4 kg)  08/10/22 267 lb (121.1 kg)    Physical Exam Vitals reviewed.  Constitutional:      Appearance: She is well-developed.  Eyes:     Conjunctiva/sclera: Conjunctivae normal.  Neck:     Thyroid: No thyroid mass, thyromegaly or thyroid tenderness.  Cardiovascular:     Rate and Rhythm: Normal rate and regular rhythm.     Pulses: Normal pulses.     Heart sounds: Normal heart sounds.  Pulmonary:     Effort: Pulmonary effort is normal.     Breath sounds: Normal breath sounds. No wheezing, rhonchi or rales.  Skin:    General: Skin is warm and dry.  Neurological:     Mental Status: She is alert.  Psychiatric:        Speech: Speech normal.        Behavior: Behavior normal.        Thought Content: Thought content normal.

## 2022-09-12 NOTE — Telephone Encounter (Signed)
Prescription refill request for Eliquis received.  Indication: afib  Last office visit: [place], 09/06/2022 Scr: 1.00, 09/06/2022 Age: 72 yo  Weight: 118 kg   Refill sent.

## 2022-09-12 NOTE — Assessment & Plan Note (Addendum)
Complicated by atrial fib, a flutter in terms of identifying symptoms of hypothyroidism. Pending thyroid studies, ultrasound. We discussed continuing to monitor for overt thyroid symptoms which would warrant starting thyroid medication.

## 2022-09-12 NOTE — Telephone Encounter (Signed)
Refill request

## 2022-09-13 ENCOUNTER — Other Ambulatory Visit: Payer: Self-pay

## 2022-09-13 IMAGING — CR DG CHEST 2V
2 series · 2 of 2 positions shown · non-contrast
Comparison: 04/05/2021 and CT chest 08/23/2018.

CLINICAL DATA: Chest pain and abdominal pain.

EXAM:
CHEST - 2 VIEW

[chest pa]
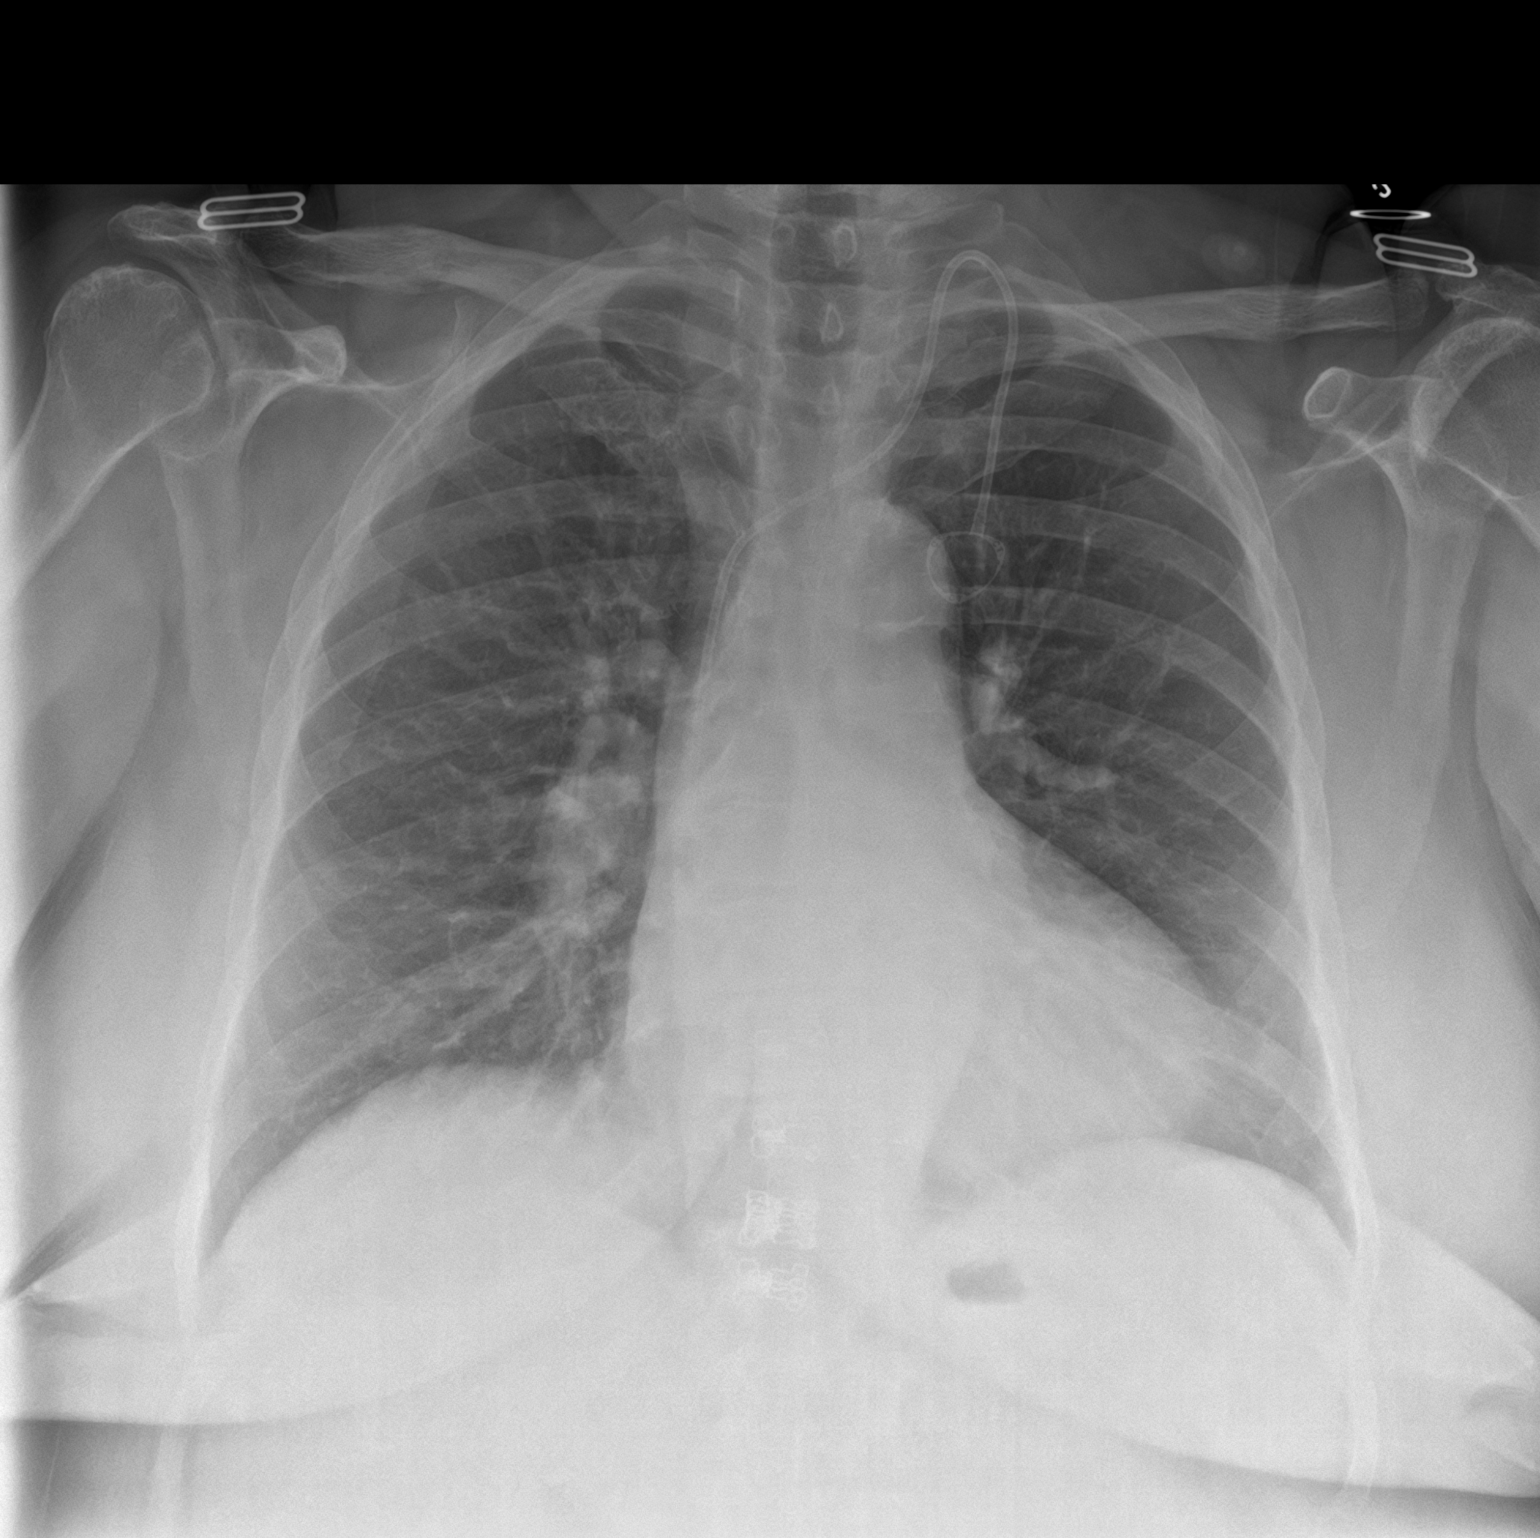

[chest lat]
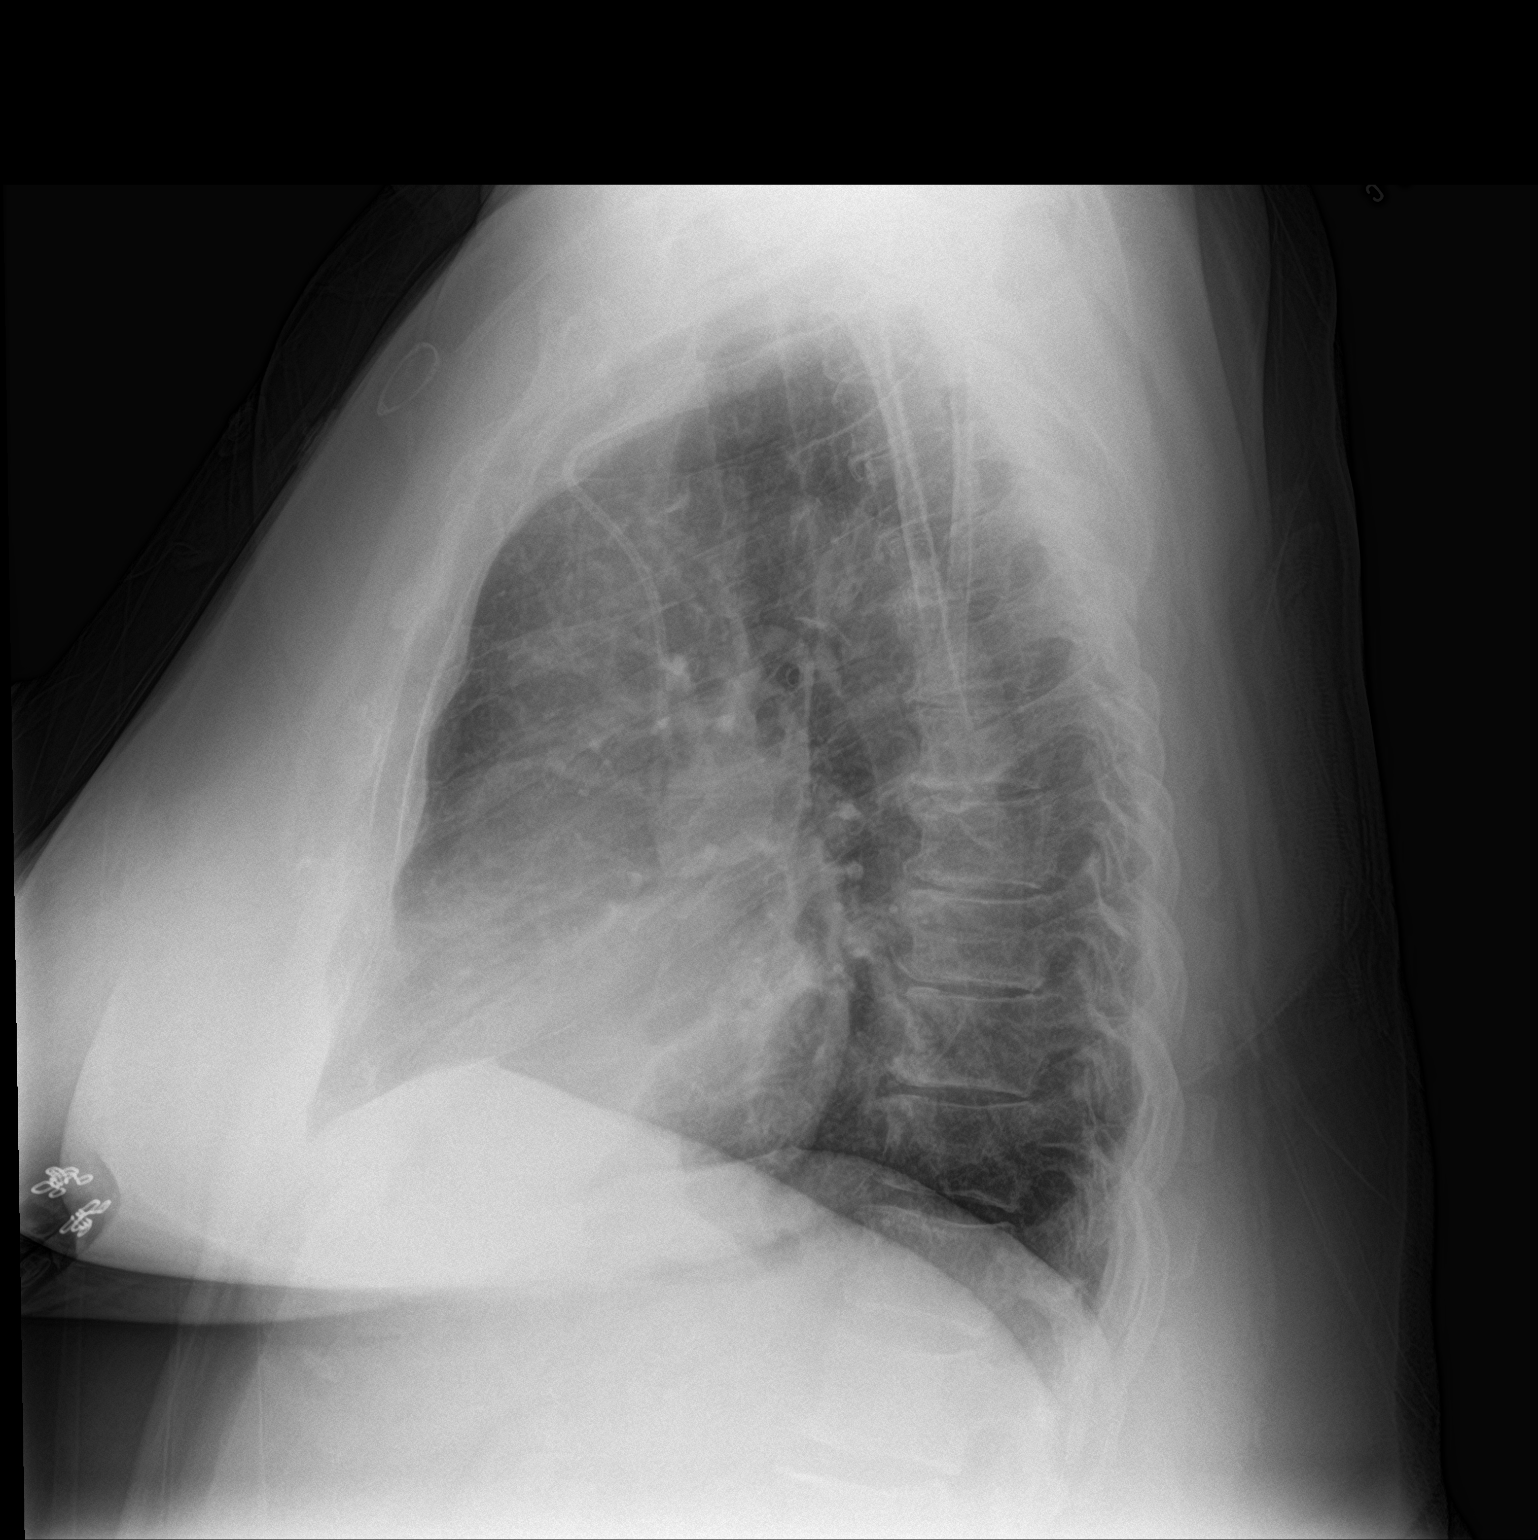

[2 of 2 positions shown; findings below may reference images not displayed]

FINDINGS: Trachea is midline. Heart is enlarged. Left IJ power port tip is in
the SVC. Lungs are clear. No pleural fluid.
IMPRESSION: No acute findings.

## 2022-09-13 IMAGING — CT CT ANGIO CHEST
2 of 7 series · 18 of 46 positions shown · IV contrast (APPLIED)
Comparison: None.

CLINICAL DATA: Upper abdominal and chest pain. High probability for
PE.

EXAM:
CT ANGIOGRAPHY CHEST WITH CONTRAST
TECHNIQUE: Multidetector CT imaging of the chest was performed using the
standard protocol during bolus administration of intravenous
contrast. Multiplanar CT image reconstructions and MIPs were
obtained to evaluate the vascular anatomy.
CONTRAST:  100mL OMNIPAQUE IOHEXOL 350 MG/ML SOLN

[Series 5: thins · axial · 0.72mm/px · z∈[-517,-246]mm · 15 of 377 slices shown]
[im 19/377  lung]
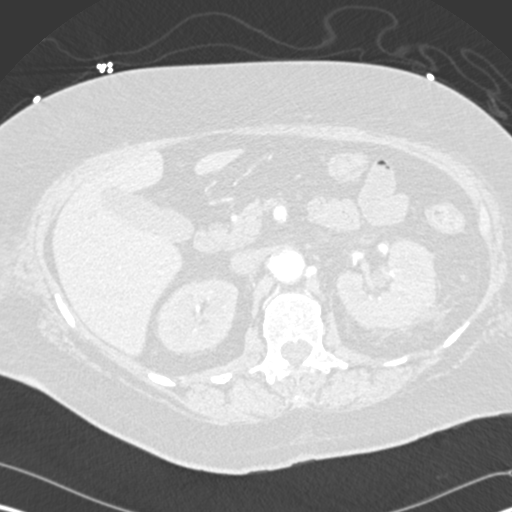
[im 38/377  soft-tissue]
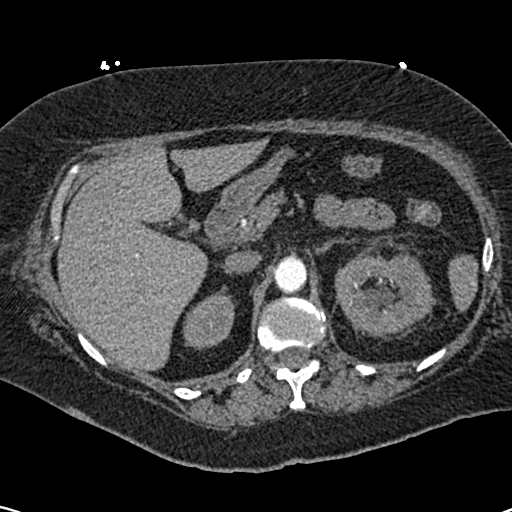
[im 76/377  lung]
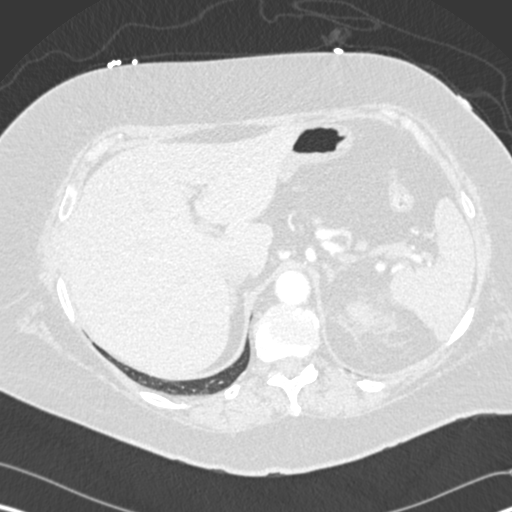
[im 95/377  soft-tissue]
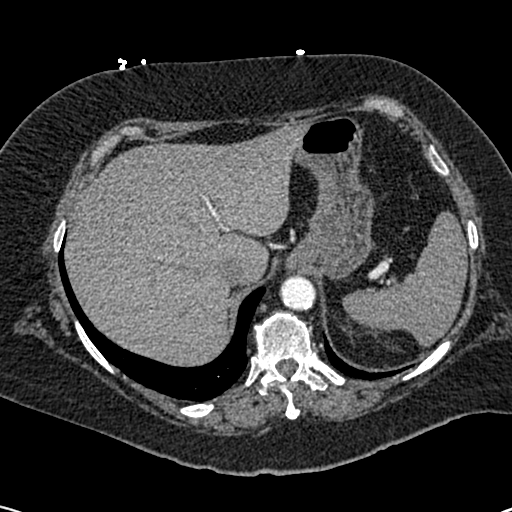
[im 113/377  lung]
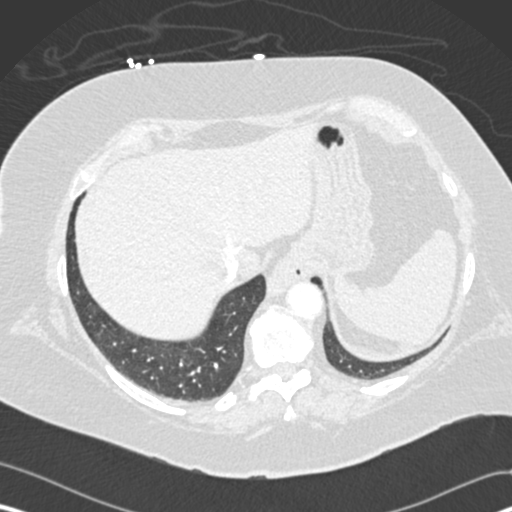
[im 132/377  soft-tissue]
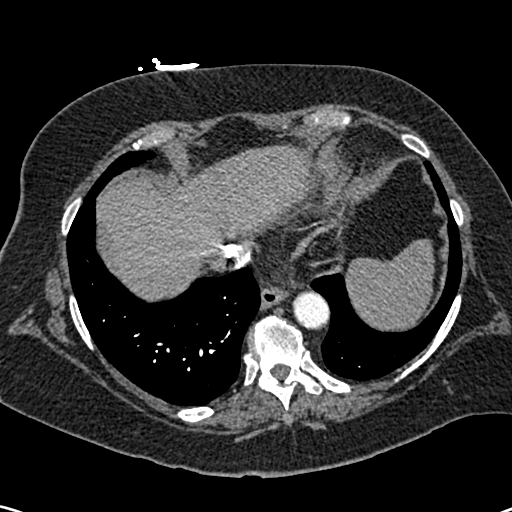
[im 170/377  lung]
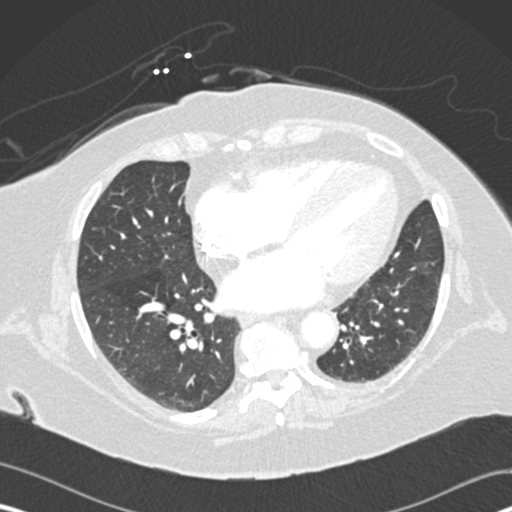
[im 189/377  soft-tissue]
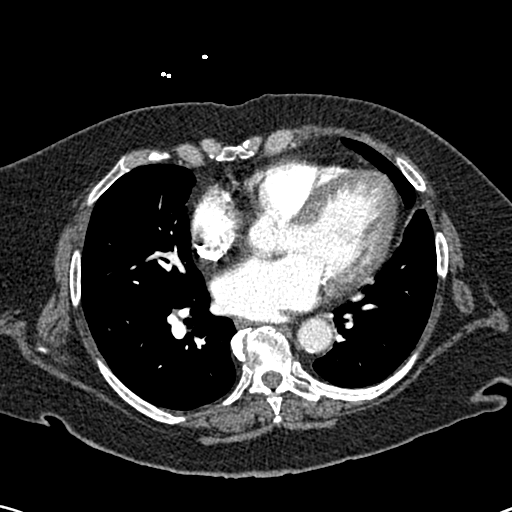
[im 207/377  lung]
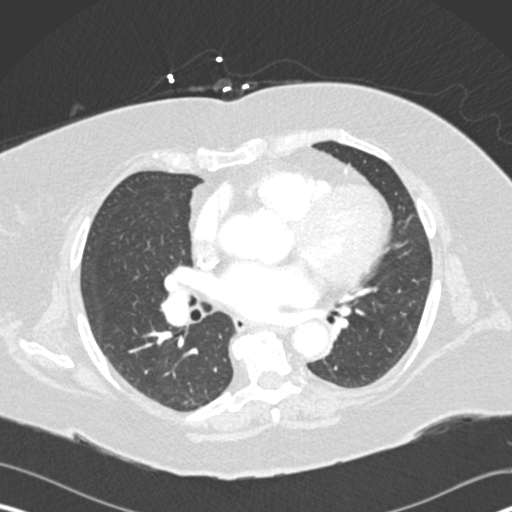
[im 245/377  soft-tissue]
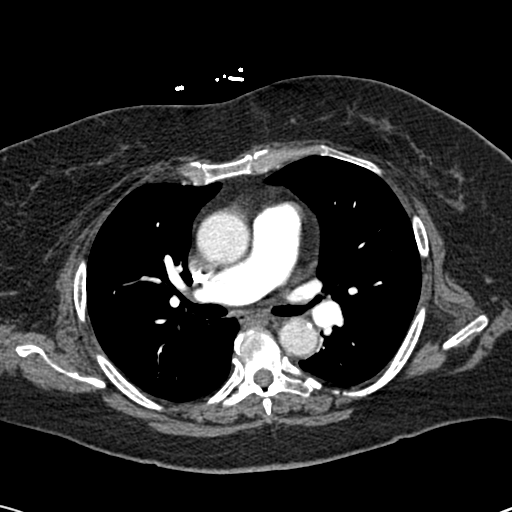
[im 264/377  lung]
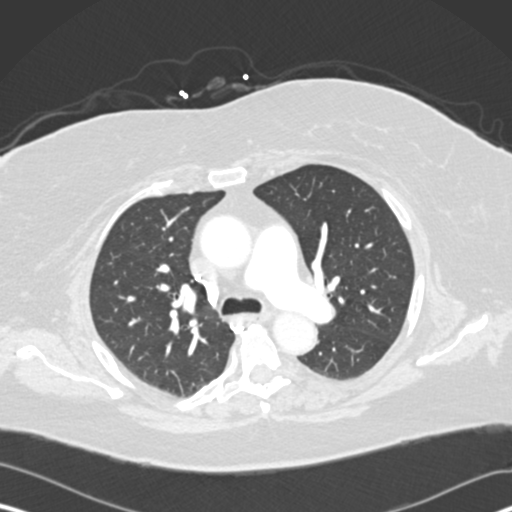
[im 283/377  soft-tissue]
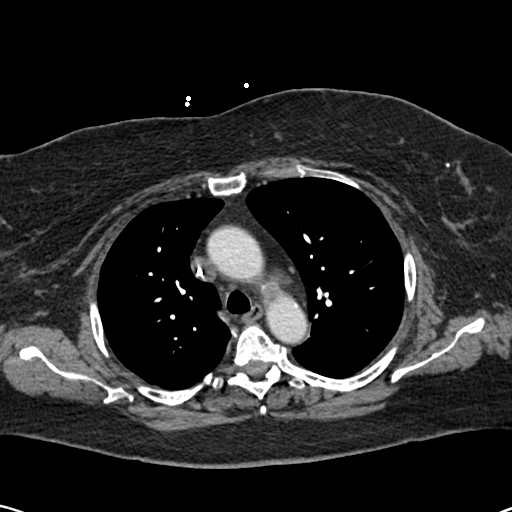
[im 301/377  lung]
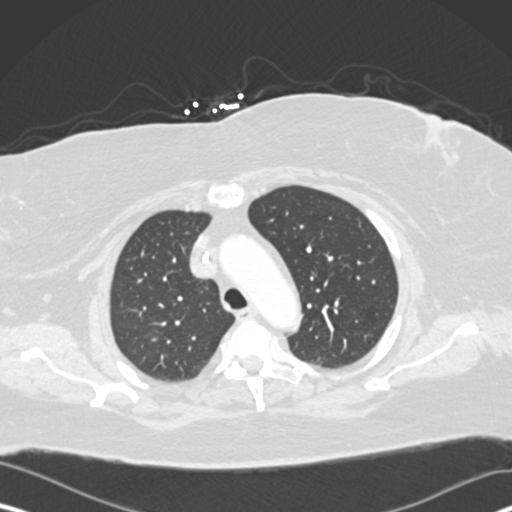
[im 339/377  soft-tissue]
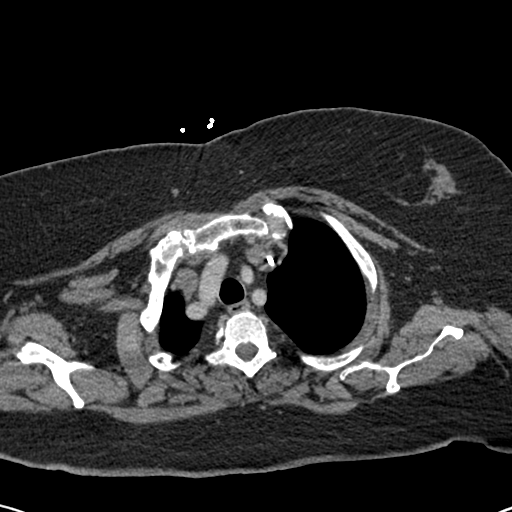
[im 358/377  lung]
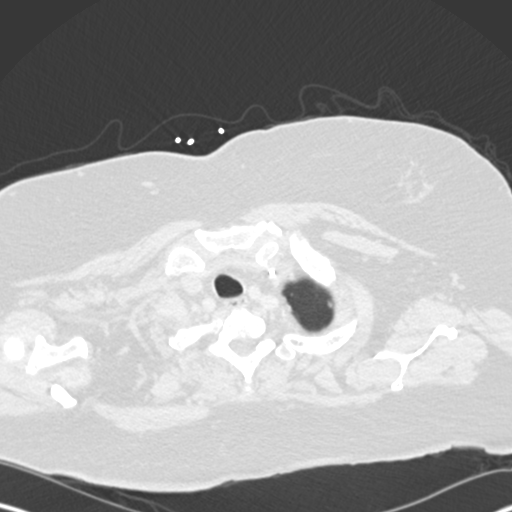

[Series 7: coronal mpr · coronal · 0.71mm/px · 3 of 136 slices shown]
[im 34/136  soft-tissue]
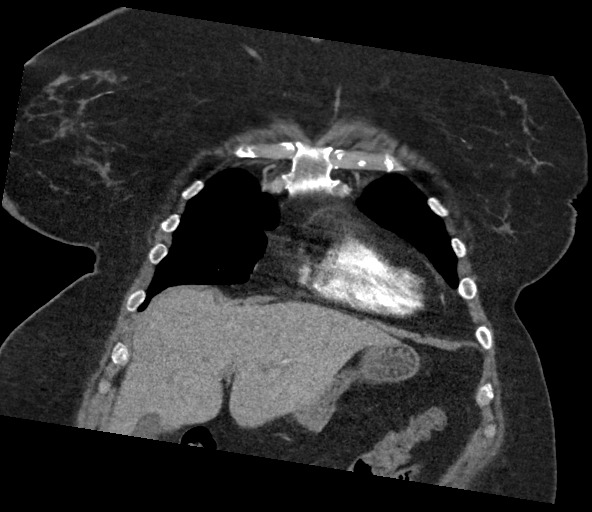
[im 68/136  soft-tissue]
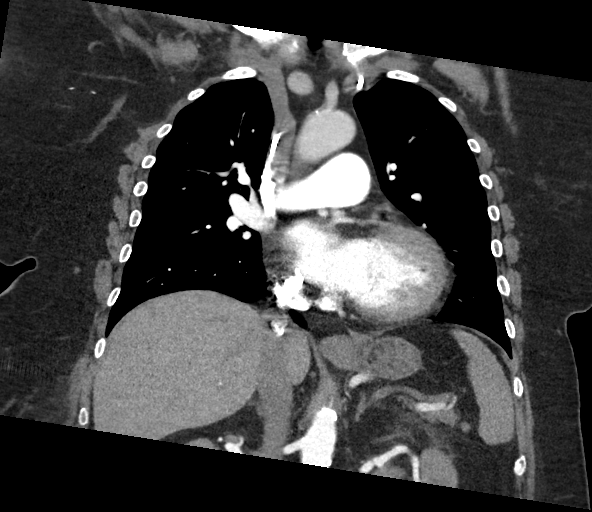
[im 102/136  soft-tissue]
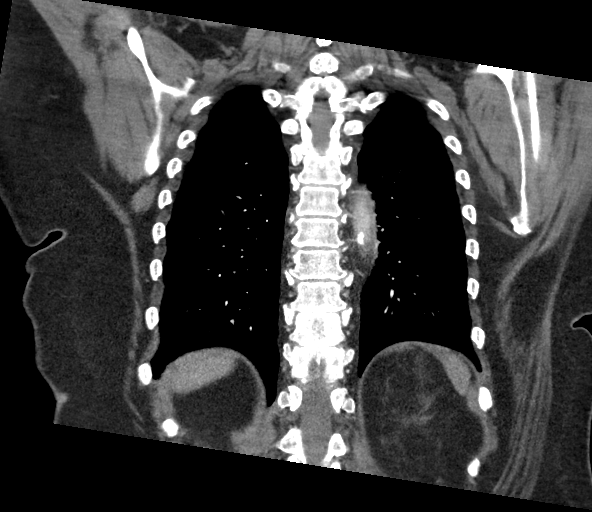

[18 of 46 positions shown; findings below may reference images not displayed]

FINDINGS: Cardiovascular: Satisfactory opacification of the pulmonary arteries
to the segmental level. No evidence of pulmonary embolism. Normal
heart size. No pericardial effusion. The main pulmonary artery is
mildly enlarged at 3.2 cm. Trace atherosclerotic calcifications
visualized along the left anterior descending coronary artery. Left
IJ approach port catheter visualized. The tip of the catheter
terminates at the cavoatrial junction.

Mediastinum/Nodes: No enlarged mediastinal, hilar, or axillary lymph
nodes. Thyroid gland, trachea, and esophagus demonstrate no
significant findings.

Lungs/Pleura: Lungs are clear. No pleural effusion or pneumothorax.

Upper Abdomen: No acute abnormality.

Musculoskeletal: No chest wall abnormality. No acute or significant
osseous findings.

Review of the MIP images confirms the above findings.
IMPRESSION: 1. No evidence of pulmonary embolus, pneumonia or other acute
cardiopulmonary process.
2. Mild enlargement of the main pulmonary artery at 3.2 cm. Findings
can be seen in the setting of pulmonary arterial hypertension.
3. Trace atherosclerotic calcifications along the thoracic aorta and
left anterior descending coronary artery.

Aortic Atherosclerosis (2OWZ3-18H.H).

## 2022-09-14 MED ORDER — ESCITALOPRAM OXALATE 10 MG PO TABS: 10.0000 mg | ORAL_TABLET | Freq: Every day | ORAL | 1 refills | Status: DC

## 2022-09-14 MED ORDER — METFORMIN HCL ER 500 MG PO TB24: 1000.0000 mg | ORAL_TABLET | Freq: Every evening | ORAL | 2 refills | Status: DC

## 2022-09-14 NOTE — Assessment & Plan Note (Addendum)
Discussed drug interaction with flecainide and Prozac . Patient confirmed she is taking flecainide '100mg'$  BID .   Consulted with pharmD, Catie Harper. We discussed fluoxetine and paroxetine as it relates to prolonged Qtc, and use of CYP2D6  enzyme. Escitalopram only has the Qtc warning. She is aware that she would need routine monitoring of EKGs which she is doing regularly with cardiology.  Pharmacy and I agreed that a low dose of escitalopram would be appropriate.   Stop fluoxetine '20mg'$ , start escitalopram '10mg'$ .

## 2022-09-14 NOTE — Patient Instructions (Signed)
Stop prozac  Start lexapro

## 2022-09-16 ENCOUNTER — Ambulatory Visit: Payer: Medicare Other | Admitting: Family

## 2022-09-19 DIAGNOSIS — D485 Neoplasm of uncertain behavior of skin: Secondary | ICD-10-CM | POA: Diagnosis not present

## 2022-09-19 DIAGNOSIS — L578 Other skin changes due to chronic exposure to nonionizing radiation: Secondary | ICD-10-CM | POA: Diagnosis not present

## 2022-09-19 DIAGNOSIS — L821 Other seborrheic keratosis: Secondary | ICD-10-CM | POA: Diagnosis not present

## 2022-09-19 DIAGNOSIS — Z872 Personal history of diseases of the skin and subcutaneous tissue: Secondary | ICD-10-CM | POA: Diagnosis not present

## 2022-09-19 DIAGNOSIS — D225 Melanocytic nevi of trunk: Secondary | ICD-10-CM | POA: Diagnosis not present

## 2022-09-20 ENCOUNTER — Ambulatory Visit
Admission: RE | Admit: 2022-09-20 | Discharge: 2022-09-20 | Disposition: A | Discharge status: Home / Self Care | Payer: Medicare Other | Source: Ambulatory Visit | Attending: Family | Admitting: Family

## 2022-09-20 DIAGNOSIS — E079 Disorder of thyroid, unspecified: Secondary | ICD-10-CM | POA: Diagnosis not present

## 2022-09-20 DIAGNOSIS — M542 Cervicalgia: Secondary | ICD-10-CM | POA: Diagnosis not present

## 2022-09-20 DIAGNOSIS — M9901 Segmental and somatic dysfunction of cervical region: Secondary | ICD-10-CM | POA: Diagnosis not present

## 2022-09-20 DIAGNOSIS — R899 Unspecified abnormal finding in specimens from other organs, systems and tissues: Secondary | ICD-10-CM | POA: Diagnosis not present

## 2022-09-20 DIAGNOSIS — M9903 Segmental and somatic dysfunction of lumbar region: Secondary | ICD-10-CM | POA: Diagnosis not present

## 2022-09-20 DIAGNOSIS — G43001 Migraine without aura, not intractable, with status migrainosus: Secondary | ICD-10-CM | POA: Diagnosis not present

## 2022-09-21 ENCOUNTER — Inpatient Hospital Stay: Payer: Medicare Other | Attending: Oncology

## 2022-09-21 DIAGNOSIS — M1712 Unilateral primary osteoarthritis, left knee: Secondary | ICD-10-CM | POA: Diagnosis not present

## 2022-09-21 DIAGNOSIS — M25662 Stiffness of left knee, not elsewhere classified: Secondary | ICD-10-CM | POA: Diagnosis not present

## 2022-09-21 DIAGNOSIS — C50411 Malignant neoplasm of upper-outer quadrant of right female breast: Secondary | ICD-10-CM | POA: Diagnosis not present

## 2022-09-21 DIAGNOSIS — M25562 Pain in left knee: Secondary | ICD-10-CM | POA: Diagnosis not present

## 2022-09-21 DIAGNOSIS — R262 Difficulty in walking, not elsewhere classified: Secondary | ICD-10-CM | POA: Diagnosis not present

## 2022-09-21 DIAGNOSIS — Z95828 Presence of other vascular implants and grafts: Secondary | ICD-10-CM

## 2022-09-21 DIAGNOSIS — Z171 Estrogen receptor negative status [ER-]: Secondary | ICD-10-CM | POA: Insufficient documentation

## 2022-09-21 MED ORDER — HEPARIN SOD (PORK) LOCK FLUSH 100 UNIT/ML IV SOLN
500.0000 [IU] | Freq: Once | INTRAVENOUS | Status: AC
  Administered 2022-09-21: 500 [IU] via INTRAVENOUS
  Filled 2022-09-21: qty 5

## 2022-09-21 NOTE — Progress Notes (Signed)
Survivorship Care Plan visit completed.  Treatment summary reviewed and given to patient.  ASCO answers booklet reviewed and given to patient.  CARE program and Cancer Transitions discussed with patient along with other resources cancer center offers to patients and caregivers.  Patient verbalized understanding.    

## 2022-09-26 ENCOUNTER — Encounter: Payer: Self-pay | Admitting: Radiation Oncology

## 2022-09-26 ENCOUNTER — Ambulatory Visit
Admission: RE | Admit: 2022-09-26 | Discharge: 2022-09-26 | Disposition: A | Discharge status: Home / Self Care | Payer: Medicare Other | Source: Ambulatory Visit | Attending: Radiation Oncology | Admitting: Radiation Oncology

## 2022-09-26 VITALS — BP 138/106 | HR 92 | Temp 97.0°F | Ht 65.0 in | Wt 258.4 lb

## 2022-09-26 DIAGNOSIS — Z170 Estrogen receptor positive status [ER+]: Secondary | ICD-10-CM | POA: Diagnosis not present

## 2022-09-26 DIAGNOSIS — C50411 Malignant neoplasm of upper-outer quadrant of right female breast: Secondary | ICD-10-CM | POA: Diagnosis not present

## 2022-09-26 DIAGNOSIS — Z923 Personal history of irradiation: Secondary | ICD-10-CM | POA: Insufficient documentation

## 2022-09-26 NOTE — Progress Notes (Signed)
Radiation Oncology Follow up Note  Name: Amber Barnes   Date:   09/26/2022 MRN:  [id number] DOB: Dec 04, 1950    This 72 y.o. female presents to the clinic today for 1 year follow-up status post whole breast radiation to her right breast for stage Ia HER2/neu positive invasive mammary carcinoma.  REFERRING PROVIDER: [name], FNP  HPI: Patient is a 72 year old female now out 1 year having completed whole breast radiation to her right breast for stage Ia (T1b N0 M0) HER2/neu positive invasive mammary carcinoma..  She had a CT scan of her chest back in August which I have reviewed showed no evidence to suggest metastatic disease.  She also had mammograms back in December which were BI-RADS 2 benign.  Patient is not on endocrine therapy.  COMPLICATIONS OF TREATMENT: none  FOLLOW UP COMPLIANCE: keeps appointments   PHYSICAL EXAM:  BP (!) 138/106 Comment: Patient states this is normal for her; medication taken  approx 1hr ago.   She has A-Fib  Pulse 92   Temp (!) 97 F (36.1 C)   Ht 5\' 5"  (1.651 m)   Wt 258 lb 6.4 oz (117.2 kg)   BMI 43.00 kg/m  Lungs are clear to A&P cardiac examination essentially unremarkable with regular rate and rhythm. No dominant mass or nodularity is noted in either breast in 2 positions examined. Incision is well-healed. No axillary or supraclavicular adenopathy is appreciated. Cosmetic result is excellent.  Patient does have some erythematous and skin changes in the midsternal region associated with her bra rubbing.  Well-developed well-nourished patient in NAD. HEENT reveals PERLA, EOMI, discs not visualized.  Oral cavity is clear. No oral mucosal lesions are identified. Neck is clear without evidence of cervical or supraclavicular adenopathy. Lungs are clear to A&P. Cardiac examination is essentially unremarkable with regular rate and rhythm without murmur rub or thrill. Abdomen is benign with no organomegaly or masses noted. Motor sensory and DTR levels  are equal and symmetric in the upper and lower extremities. Cranial nerves II through XII are grossly intact. Proprioception is intact. No peripheral adenopathy or edema is identified. No motor or sensory levels are noted. Crude visual fields are within normal range.  RADIOLOGY RESULTS: Mammograms CT scans reviewed compatible with above-stated findings  PLAN: Present time patient is doing well with no evidence of disease now 1 year out from whole breast radiation.  She has completed all therapy at this time.  I have suggested she gets different bra based on the irritation this is causing her skin and the overlying sternum.  This is not radiation related.  I have otherwise asked to see her back in 1 year for follow-up.  Patient is to call with any concerns.  I would like to take this opportunity to thank you for allowing me to participate in the care of your patient.Noreene Filbert, MD

## 2022-09-27 DIAGNOSIS — G43001 Migraine without aura, not intractable, with status migrainosus: Secondary | ICD-10-CM | POA: Diagnosis not present

## 2022-09-27 DIAGNOSIS — M542 Cervicalgia: Secondary | ICD-10-CM | POA: Diagnosis not present

## 2022-09-27 DIAGNOSIS — M9901 Segmental and somatic dysfunction of cervical region: Secondary | ICD-10-CM | POA: Diagnosis not present

## 2022-09-27 DIAGNOSIS — M9903 Segmental and somatic dysfunction of lumbar region: Secondary | ICD-10-CM | POA: Diagnosis not present

## 2022-09-28 DIAGNOSIS — M25562 Pain in left knee: Secondary | ICD-10-CM | POA: Diagnosis not present

## 2022-09-28 DIAGNOSIS — M1712 Unilateral primary osteoarthritis, left knee: Secondary | ICD-10-CM | POA: Diagnosis not present

## 2022-09-28 DIAGNOSIS — M25662 Stiffness of left knee, not elsewhere classified: Secondary | ICD-10-CM | POA: Diagnosis not present

## 2022-09-28 DIAGNOSIS — R262 Difficulty in walking, not elsewhere classified: Secondary | ICD-10-CM | POA: Diagnosis not present

## 2022-10-03 ENCOUNTER — Encounter: Payer: Self-pay | Admitting: Family

## 2022-10-03 ENCOUNTER — Other Ambulatory Visit: Payer: Self-pay | Admitting: Family

## 2022-10-03 DIAGNOSIS — Z6841 Body Mass Index (BMI) 40.0 and over, adult: Secondary | ICD-10-CM

## 2022-10-03 MED ORDER — METFORMIN HCL ER 500 MG PO TB24: ORAL_TABLET | ORAL | 3 refills | Status: DC

## 2022-10-03 NOTE — Telephone Encounter (Signed)
Spoke to pt and she stated that she is running out of the Metformin because of her  titrating the dosage.

## 2022-10-03 NOTE — Telephone Encounter (Signed)
Spoke to pt and informed her that Metformin has been sent in and updated

## 2022-10-04 DIAGNOSIS — M9901 Segmental and somatic dysfunction of cervical region: Secondary | ICD-10-CM | POA: Diagnosis not present

## 2022-10-04 DIAGNOSIS — M542 Cervicalgia: Secondary | ICD-10-CM | POA: Diagnosis not present

## 2022-10-04 DIAGNOSIS — G43001 Migraine without aura, not intractable, with status migrainosus: Secondary | ICD-10-CM | POA: Diagnosis not present

## 2022-10-04 DIAGNOSIS — M9903 Segmental and somatic dysfunction of lumbar region: Secondary | ICD-10-CM | POA: Diagnosis not present

## 2022-10-05 DIAGNOSIS — R262 Difficulty in walking, not elsewhere classified: Secondary | ICD-10-CM | POA: Diagnosis not present

## 2022-10-05 DIAGNOSIS — M25562 Pain in left knee: Secondary | ICD-10-CM | POA: Diagnosis not present

## 2022-10-05 DIAGNOSIS — M1712 Unilateral primary osteoarthritis, left knee: Secondary | ICD-10-CM | POA: Diagnosis not present

## 2022-10-05 DIAGNOSIS — M25662 Stiffness of left knee, not elsewhere classified: Secondary | ICD-10-CM | POA: Diagnosis not present

## 2022-10-08 DIAGNOSIS — G629 Polyneuropathy, unspecified: Secondary | ICD-10-CM | POA: Diagnosis not present

## 2022-10-08 DIAGNOSIS — M1712 Unilateral primary osteoarthritis, left knee: Secondary | ICD-10-CM | POA: Diagnosis not present

## 2022-10-08 DIAGNOSIS — J449 Chronic obstructive pulmonary disease, unspecified: Secondary | ICD-10-CM | POA: Diagnosis not present

## 2022-10-08 DIAGNOSIS — I509 Heart failure, unspecified: Secondary | ICD-10-CM | POA: Diagnosis not present

## 2022-10-08 DIAGNOSIS — C50911 Malignant neoplasm of unspecified site of right female breast: Secondary | ICD-10-CM | POA: Diagnosis not present

## 2022-10-11 ENCOUNTER — Other Ambulatory Visit: Payer: Self-pay

## 2022-10-12 ENCOUNTER — Ambulatory Visit: Payer: Medicare Other | Admitting: Radiation Oncology

## 2022-10-17 ENCOUNTER — Other Ambulatory Visit: Payer: Self-pay | Admitting: Nurse Practitioner

## 2022-10-18 DIAGNOSIS — M9903 Segmental and somatic dysfunction of lumbar region: Secondary | ICD-10-CM | POA: Diagnosis not present

## 2022-10-18 DIAGNOSIS — G43001 Migraine without aura, not intractable, with status migrainosus: Secondary | ICD-10-CM | POA: Diagnosis not present

## 2022-10-18 DIAGNOSIS — M542 Cervicalgia: Secondary | ICD-10-CM | POA: Diagnosis not present

## 2022-10-18 DIAGNOSIS — M9901 Segmental and somatic dysfunction of cervical region: Secondary | ICD-10-CM | POA: Diagnosis not present

## 2022-10-20 ENCOUNTER — Other Ambulatory Visit: Payer: Self-pay | Admitting: Family

## 2022-10-20 ENCOUNTER — Ambulatory Visit: Payer: Medicare Other | Attending: Cardiology

## 2022-10-20 DIAGNOSIS — R0609 Other forms of dyspnea: Secondary | ICD-10-CM

## 2022-10-20 DIAGNOSIS — I77810 Thoracic aortic ectasia: Secondary | ICD-10-CM

## 2022-10-20 DIAGNOSIS — F419 Anxiety disorder, unspecified: Secondary | ICD-10-CM

## 2022-10-20 HISTORY — DX: Thoracic aortic ectasia: I77.810

## 2022-10-20 LAB — ECHOCARDIOGRAM COMPLETE
AR max vel: 1.92 cm2
AV Area VTI: 1.82 cm2
AV Area mean vel: 1.76 cm2
AV Mean grad: 2 mmHg
AV Peak grad: 4.3 mmHg
Ao pk vel: 1.04 m/s
Calc EF: 41.8 %
S' Lateral: 3.7 cm
Single Plane A2C EF: 40.5 %
Single Plane A4C EF: 42.4 %

## 2022-10-27 ENCOUNTER — Encounter: Payer: Self-pay | Admitting: Cardiology

## 2022-10-27 ENCOUNTER — Ambulatory Visit: Payer: Medicare Other | Attending: Cardiology | Admitting: Cardiology

## 2022-10-27 VITALS — BP 128/80 | HR 80 | Ht 65.0 in | Wt 248.0 lb

## 2022-10-27 DIAGNOSIS — I5032 Chronic diastolic (congestive) heart failure: Secondary | ICD-10-CM | POA: Diagnosis not present

## 2022-10-27 DIAGNOSIS — I4819 Other persistent atrial fibrillation: Secondary | ICD-10-CM | POA: Diagnosis not present

## 2022-10-27 DIAGNOSIS — E785 Hyperlipidemia, unspecified: Secondary | ICD-10-CM

## 2022-10-27 DIAGNOSIS — Z171 Estrogen receptor negative status [ER-]: Secondary | ICD-10-CM

## 2022-10-27 DIAGNOSIS — I10 Essential (primary) hypertension: Secondary | ICD-10-CM | POA: Diagnosis not present

## 2022-10-27 DIAGNOSIS — R072 Precordial pain: Secondary | ICD-10-CM | POA: Diagnosis not present

## 2022-10-27 DIAGNOSIS — I2510 Atherosclerotic heart disease of native coronary artery without angina pectoris: Secondary | ICD-10-CM | POA: Diagnosis not present

## 2022-10-27 DIAGNOSIS — C50411 Malignant neoplasm of upper-outer quadrant of right female breast: Secondary | ICD-10-CM

## 2022-10-27 NOTE — Patient Instructions (Addendum)
Medication Instructions:  - Your physician recommends that you continue on your current medications as directed. Please refer to the Current Medication list given to you today.  *If you need a refill on your cardiac medications before your next appointment, please call your pharmacy*   Lab Work: - none ordered  If you have labs (blood work) drawn today and your tests are completely normal, you will receive your results only by: MyChart Message (if you have MyChart) OR A paper copy in the mail If you have any lab test that is abnormal or we need to change your treatment, we will call you to review the results.   Testing/Procedures: - Your physician has requested that you have a lexiscan myoview (chemical stress test/ cardiac nuclear scan)   [facility] MYOVIEW  Your caregiver has ordered a Stress Test with nuclear imaging. The purpose of this test is to evaluate the blood supply to your heart muscle. This procedure is referred to as a "Non-Invasive Stress Test." This is because other than having an IV started in your vein, nothing is inserted or "invades" your body. Cardiac stress tests are done to find areas of poor blood flow to the heart by determining the extent of coronary artery disease (CAD). Some patients exercise on a treadmill, which naturally increases the blood flow to your heart, while others who are  unable to walk on a treadmill due to physical limitations have a pharmacologic/chemical stress agent called Lexiscan . This medicine will mimic walking on a treadmill by temporarily increasing your coronary blood flow.   Please note: these test may take anywhere between 2-4 hours to complete  PLEASE REPORT TO [facility] MEDICAL MALL ENTRANCE  THE VOLUNTEERS AT THE FIRST DESK WILL DIRECT YOU WHERE TO GO  Date of Procedure:_____________________________________  Arrival Time for Procedure:______________________________  Instructions regarding medication:   _xx___ : Hold Lasix  (furosemide)  & Metformin the morning of your test  _xx___:  Bonita Quin may take all of your other regular morning medications with enough water to get them down safely they day of your test.   PLEASE NOTIFY THE OFFICE AT LEAST 24 HOURS IN ADVANCE IF YOU ARE UNABLE TO KEEP YOUR APPOINTMENT.  [phone] AND  PLEASE NOTIFY NUCLEAR MEDICINE AT [facility] AT LEAST 24 HOURS IN ADVANCE IF YOU ARE UNABLE TO KEEP YOUR APPOINTMENT. [phone]  How to prepare for your Myoview test:  Do not eat or drink after midnight No caffeine for 24 hours prior to test No smoking 24 hours prior to test. Your medication may be taken with water.  If your doctor stopped a medication because of this test, do not take that medication. Ladies, please do not wear dresses.  Skirts or pants are appropriate. Please wear a short sleeve shirt. No perfume, cologne or lotion. Wear comfortable walking shoes. No heels!     Follow-Up: At [facility], you and your health needs are our priority.  As part of our continuing mission to provide you with exceptional heart care, we have created designated Provider Care Teams.  These Care Teams include your primary Cardiologist (physician) and Advanced Practice Providers (APPs -  Physician Assistants and Nurse Practitioners) who all work together to provide you with the care you need, when you need it.  We recommend signing up for the patient portal called "MyChart".  Sign up information is provided on this After Visit Summary.  MyChart is used to connect with patients for Virtual Visits (Telemedicine).  Patients are able to view  lab/test results, encounter notes, upcoming appointments, etc.  Non-urgent messages can be sent to your provider as well.   To learn more about what you can do with MyChart, go to [email or web address].    Your next appointment:   After stress testing is completed   Provider:   You may see Julien Nordmann, MD or one of the following Advanced Practice Providers on your  designated Care Team:    Charlsie Quest, NP    Other Instructions Cardiac Nuclear Scan A cardiac nuclear scan is a test that measures blood flow to the heart when a person is resting and when exercising. The test looks for problems such as: Not enough blood reaching a portion of the heart. The heart muscle not working normally. You may need this test if: You have heart disease. You have had abnormal lab results. You have had heart surgery or a balloon procedure to open up blocked arteries (angioplasty) or a small mesh tube (stent). You have chest pain. You have shortness of breath. You have had a heart attack (myocardial infarction). In this test, a radioactive dye (tracer) is injected into your bloodstream. After the tracer has traveled to your heart, an imaging device is used to measure how much of the tracer is absorbed by or distributed to various areas of your heart. This procedure is usually done at a hospital or clinic and takes 2-4 hours. Tell a health care provider about: Any allergies you have. All medicines you are taking, including vitamins, herbs, eye drops, creams, and over-the-counter medicines. Any bleeding problems you have. Any surgeries you have had. Any medical conditions you have. Whether you are pregnant or may be pregnant. Any history of asthma or chronic lung disease. Any history of heart rhythm disorders or heart valve conditions. What are the risks? Your health care provider will talk with you about risks. These may include: Serious chest pain and heart attack. This is only a risk if the stress portion of the test is done. Fast or irregular heartbeats (palpitations). Sensation of warmth in your chest. This usually passes quickly. Allergic reaction to the tracer. This is generally mild and extremely rare. Shortness of breath or trouble breathing. What happens before the procedure? Ask your health care provider about changing or stopping your regular  medicines. This is especially important if you are taking diabetes medicines or blood thinners. Follow instructions from your health care provider about eating or drinking restrictions. Remove your jewelry on the day of the procedure. Ask your health care provider about nicotine or caffeine restrictions. What happens during the procedure? An IV will be inserted into one of your veins. Your health care provider will inject a small amount of radioactive tracer through the IV. You will wait for 20-40 minutes while the tracer travels through your bloodstream. Your heart activity will be monitored with an electrocardiogram (ECG). You will lie down on an exam table. Images of your heart will be taken for about 15-20 minutes. You may also have a stress test. For this test, one of the following may be done: You will exercise on a treadmill or stationary bike. While you exercise, your heart's activity will be monitored with an ECG, and your blood pressure will be checked. You will be given medicines that will increase blood flow to parts of your heart. This is done if you are unable to exercise. When blood flow to your heart has peaked, a tracer will again be injected through the IV. After 20-40 minutes, you will  get back on the exam table and have more images taken of your heart. Depending on the type of tracer used, scans may need to be repeated 3-4 hours later. Your IV line will be removed when the procedure is over. The procedure may vary among health care providers and hospitals. What happens after the procedure? Unless your health care provider tells you not to, return to your normal schedule, including diet, activities, international travel, and medicines. Unless your health care provider tells you not to, increase your fluid intake. Drinking more fluid will help to flush the tracer from your body. Ask your health care provider, or the department that is doing the test: When will my results be  ready? How will I get my results? What are my treatment options? What other tests do I need? What are my next steps? This information is not intended to replace advice given to you by your health care provider. Make sure you discuss any questions you have with your health care provider. Document Revised: 11/23/2021 Document Reviewed: 11/23/2021 Elsevier Patient Education  2023 [employer].

## 2022-10-27 NOTE — Progress Notes (Signed)
Cardiology Office Note:   Date:  10/27/2022  ID:  Amber Barnes, DOB 1951/03/09, MRN [id number]  History of Present Illness:   Amber Barnes is a 72 y.o. female with past medical history of paroxysmal atrial fibrillation with cardioversion in 2018, hypertension, chronic diastolic congestive heart failure, obstructive sleep apnea, COPD, gastroesophageal reflux disease, fatty liver disease, neuropathy, CKD, morbid obesity, arthritis, hyperlipidemia, migraines, history of breast cancer who has been on maintenance treatment, who returns to clinic today to follow-up on her atrial fibrillation.  She had stress testing completed in January 2021 showed no ischemia, ejection fraction 55 to 60%, she was diagnosed with breast cancer during treatments per oncology.  Repeat echocardiogram every 3 months and continued to have normal EF.  She was hospitalized early January 2023 required 6 days of IV antibiotic therapy for kidney infection she had acute pyelonephritis with a ureterovesicular junction obstruction and right hydroureteronephrosis.  Echocardiogram 1/23 revealed LVEF of 60%, repeat echo 11/26/2021 revealed EF 55 to 60%, 02/25/2022 EF of 60 to 65% in 05/31/2022 she remained 55 to 60%.  She did undergo exercise tolerance testing that was considered inconclusive due to the lack of heart rate augmentation and hypertensive exercise response with no ST changes or ectopy noted on her study.  She continues to remain symptomatic from her atrial fibrillation and atrial flutter was evaluated by EP in 07/31/2022.  At that time she was scheduled for coronary CTA and ablation at a place in May 2024.  She was last seen in clinic 09/06/2022 with continued complaints of fatigue, weakness, palpitations related to atrial fibrillation and atrial flutter.  She was scheduled for repeat echocardiogram to reevaluate her symptoms.  She returns clinic today with complaints of fatigue, shortness of breath, and occasional chest  discomfort that she states chest twinges in the left upper chest and shoulder that happens on exertion.  She states that she still has episodes when she exerts herself of coronary in atrial fibrillation but this is different feeling them with her A-fib for which she has chest discomfort.  She has not noted with rest.  She continues to have peripheral edema that has improved.  She states she did have 1 episode where she had a lower blood pressure in the evening called the answering service was advised to increase her sodium today to increase her blood pressure.  No other changes were made to her medications.  She continues to follow with the cancer center and had her last treatment in January of this year.  She is still waiting to have her ablation for atrial fibrillation that was changed to she she said due to some scheduling issues with anesthesia.  She denies any recent hospitalizations or visits to the emergency department.  ROS: 10 point review of systems has been reviewed is considered negative with exception of what is in the HPI.  Studies Reviewed:    EKG: No new tracings have been completed  TTE 10/20/22  1. Left ventricular ejection fraction, by estimation, is 45 to 50%. Left  ventricular ejection fraction by 3D volume is 44 %. The left ventricle has  mildly decreased function. The left ventricle demonstrates global  hypokinesis. Left ventricular diastolic   parameters are indeterminate. The average left ventricular global  longitudinal strain is -8.0 %.   2. Right ventricular systolic function is moderately reduced. The right  ventricular size is normal. There is normal pulmonary artery systolic  pressure. The estimated right ventricular systolic pressure is 28.0 mmHg.   3. Left  atrial size was mildly dilated.   4. The mitral valve is normal in structure. Mild mitral valve  regurgitation. No evidence of mitral stenosis.   5. Tricuspid valve regurgitation is mild to moderate.   6. The  aortic valve has an indeterminant number of cusps. Aortic valve  regurgitation is not visualized. No aortic stenosis is present.   7. There is borderline dilatation of the ascending aorta, measuring 39  mm.   8. The inferior vena cava is normal in size with greater than 50%  respiratory variability, suggesting right atrial pressure of 3 mmHg.   Risk Assessment/Calculations:    CHA2DS2-VASc Score = 5   This indicates a 7.2% annual risk of stroke. The patient's score is based upon: CHF History: 1 HTN History: 1 Diabetes History: 1 Stroke History: 0 Vascular Disease History: 0 Age Score: 1 Gender Score: 1             Physical Exam:   VS:  BP 128/80   Pulse 80   Ht 5\' 5"  (1.651 m)   Wt 248 lb (112.5 kg)   SpO2 94%   BMI 41.27 kg/m    Wt Readings from Last 3 Encounters:  10/27/22 248 lb (112.5 kg)  09/26/22 258 lb 6.4 oz (117.2 kg)  09/12/22 258 lb 3.2 oz (117.1 kg)     GEN: Well nourished, well developed in no acute distress NECK: No JVD; No carotid bruits CARDIAC: RRR, no murmurs, rubs, gallops RESPIRATORY:  Clear to auscultation without rales, wheezing or rhonchi  ABDOMEN: Soft, non-tender, non-distended EXTREMITIES: Trace pretibial edema; No deformity   ASSESSMENT AND PLAN:   Precordial chest discomfort.  Patient states that the discomfort only happens with exertion and it has primarily in the left upper chest.  Radiation into the shoulder.  She states last 5 to 10 minutes after she rests and then resolves on its own.  She had previously had an exercise stress testing that was nonobstructive and did not test due to the chest heaviness being stopped due to elevated blood pressures.  At this time she has been scheduled for a Lexiscan Myoview to rule out ischemia related to her discomfort as well as a noted drop in her EF on her echocardiogram.  Chronic diastolic congestive heart failure longstanding history of being symptomatic with atrial fibrillation.  She is down 10  pounds today from previous visit.  She has been having issues with sleeping but not with appetite.  She has some trace pretibial edema but overall is euvolemic on exam.  Recent echocardiogram revealed an LVEF of 45 to 50% which is dropped from her previous study 55 to 60%.  Left ventricle demonstrates global hypokinesis which is a new finding on her echocardiograms.  She also recently had her last treatment for breast cancer in January of this year.  She is continued on bisoprolol, furosemide, and losartan.  Will consider adding SGLT2 inhibitor or MRA on return if there is no further issues noted with blood pressure.  Coronary artery calcifications noted on chest CT.  She is continued on apixaban and lieu of aspirin, rosuvastatin 40 mg daily, Ranexa 500 mg twice daily for antianginal effect.  Essential hypertension blood pressure today 128/80.  Blood pressures remained stable.  He is continued on losartan HCTZ 50/12.5 mg daily, Zebeta, and furosemide.  She is encouraged to continue to monitor blood pressure at home and notify us of fluctuations.  Hyperlipidemia previous LDL of 109.  She is continued on rosuvastatin 40 mg daily.  This continues to be monitored by PCP.  Persistent atrial fibrillation she is extremely symptomatic today.  She has an A-fib ablation scheduled for June.  She is continued on apixaban 5 mg twice daily for CHA2DS2-VASc score of at least 5.  She denies any bleeding.  Labs have been stable.  She is continued on flecainide, bisoprolol, diltiazem.  She has been encouraged to continue with her appointments with PCP and maintain her follow-ups.  History of HDR 2-positive carcinoma of the breast.  She is recently finished treatments in January 2024.  Echocardiogram was completed with noted decrease in EF of 45-50%.  She continues to be followed by oncology.  Disposition patient return to clinic to see MD/APP once stress testing is completed to reevaluate symptoms.    Shared Decision  Making/Informed Consent The risks [chest pain, shortness of breath, cardiac arrhythmias, dizziness, blood pressure fluctuations, myocardial infarction, stroke/transient ischemic attack, nausea, vomiting, allergic reaction, radiation exposure, metallic taste sensation and life-threatening complications (estimated to be 1 in 10,000)], benefits (risk stratification, diagnosing coronary artery disease, treatment guidance) and alternatives of a nuclear stress test were discussed in detail with Ms. Fuston and she agrees to proceed.   Signed, Diann Bangerter, NP

## 2022-10-31 ENCOUNTER — Inpatient Hospital Stay (HOSPITAL_BASED_OUTPATIENT_CLINIC_OR_DEPARTMENT_OTHER): Payer: Medicare Other | Admitting: Oncology

## 2022-10-31 ENCOUNTER — Inpatient Hospital Stay: Payer: Medicare Other | Attending: Oncology

## 2022-10-31 ENCOUNTER — Encounter: Payer: Self-pay | Admitting: Oncology

## 2022-10-31 ENCOUNTER — Inpatient Hospital Stay: Payer: Medicare Other

## 2022-10-31 VITALS — BP 115/93 | HR 84 | Temp 97.2°F | Resp 18 | Wt 247.5 lb

## 2022-10-31 DIAGNOSIS — Z87891 Personal history of nicotine dependence: Secondary | ICD-10-CM | POA: Diagnosis not present

## 2022-10-31 DIAGNOSIS — E876 Hypokalemia: Secondary | ICD-10-CM

## 2022-10-31 DIAGNOSIS — Z9221 Personal history of antineoplastic chemotherapy: Secondary | ICD-10-CM | POA: Insufficient documentation

## 2022-10-31 DIAGNOSIS — C50919 Malignant neoplasm of unspecified site of unspecified female breast: Secondary | ICD-10-CM

## 2022-10-31 DIAGNOSIS — I480 Paroxysmal atrial fibrillation: Secondary | ICD-10-CM

## 2022-10-31 DIAGNOSIS — Z7901 Long term (current) use of anticoagulants: Secondary | ICD-10-CM | POA: Diagnosis not present

## 2022-10-31 DIAGNOSIS — C50411 Malignant neoplasm of upper-outer quadrant of right female breast: Secondary | ICD-10-CM

## 2022-10-31 DIAGNOSIS — G629 Polyneuropathy, unspecified: Secondary | ICD-10-CM | POA: Insufficient documentation

## 2022-10-31 DIAGNOSIS — Z171 Estrogen receptor negative status [ER-]: Secondary | ICD-10-CM | POA: Diagnosis not present

## 2022-10-31 DIAGNOSIS — Z923 Personal history of irradiation: Secondary | ICD-10-CM | POA: Diagnosis not present

## 2022-10-31 DIAGNOSIS — I4891 Unspecified atrial fibrillation: Secondary | ICD-10-CM | POA: Diagnosis not present

## 2022-10-31 LAB — COMPREHENSIVE METABOLIC PANEL
ALT: 13 U/L (ref 0–44)
AST: 20 U/L (ref 15–41)
Albumin: 4.2 g/dL (ref 3.5–5.0)
Alkaline Phosphatase: 69 U/L (ref 38–126)
Anion gap: 14 (ref 5–15)
BUN: 19 mg/dL (ref 8–23)
CO2: 27 mmol/L (ref 22–32)
Calcium: 8.8 mg/dL — ABNORMAL LOW (ref 8.9–10.3)
Chloride: 97 mmol/L — ABNORMAL LOW (ref 98–111)
Creatinine, Ser: 1.25 mg/dL — ABNORMAL HIGH (ref 0.44–1.00)
GFR, Estimated: 47 mL/min — ABNORMAL LOW (ref >60)
Glucose, Bld: 115 mg/dL — ABNORMAL HIGH (ref 70–99)
Potassium: 2.9 mmol/L — ABNORMAL LOW (ref 3.5–5.1)
Sodium: 138 mmol/L (ref 135–145)
Total Bilirubin: 0.8 mg/dL (ref 0.3–1.2)
Total Protein: 7.2 g/dL (ref 6.5–8.1)

## 2022-10-31 LAB — CBC WITH DIFFERENTIAL/PLATELET
Abs Immature Granulocytes: 0.01 10*3/uL (ref 0.00–0.07)
Basophils Absolute: 0 10*3/uL (ref 0.0–0.1)
Basophils Relative: 1 %
Eosinophils Absolute: 0.1 10*3/uL (ref 0.0–0.5)
Eosinophils Relative: 1 %
HCT: 42.2 % (ref 36.0–46.0)
Hemoglobin: 14.6 g/dL (ref 12.0–15.0)
Immature Granulocytes: 0 %
Lymphocytes Relative: 30 %
Lymphs Abs: 1.9 10*3/uL (ref 0.7–4.0)
MCH: 31.3 pg (ref 26.0–34.0)
MCHC: 34.6 g/dL (ref 30.0–36.0)
MCV: 90.4 fL (ref 80.0–100.0)
Monocytes Absolute: 0.4 10*3/uL (ref 0.1–1.0)
Monocytes Relative: 6 %
Neutro Abs: 3.9 10*3/uL (ref 1.7–7.7)
Neutrophils Relative %: 62 %
Platelets: 175 10*3/uL (ref 150–400)
RBC: 4.67 MIL/uL (ref 3.87–5.11)
RDW: 13.5 % (ref 11.5–15.5)
WBC: 6.3 10*3/uL (ref 4.0–10.5)
nRBC: 0 % (ref 0.0–0.2)

## 2022-10-31 MED ORDER — HEPARIN SOD (PORK) LOCK FLUSH 100 UNIT/ML IV SOLN
500.0000 [IU] | Freq: Once | INTRAVENOUS | Status: AC
  Administered 2022-10-31: 500 [IU] via INTRAVENOUS
  Filled 2022-10-31: qty 5

## 2022-10-31 MED ORDER — SODIUM CHLORIDE 0.9% FLUSH
10.0000 mL | Freq: Once | INTRAVENOUS | Status: AC
  Administered 2022-10-31: 10 mL via INTRAVENOUS
  Filled 2022-10-31: qty 10

## 2022-10-31 MED ORDER — POTASSIUM CHLORIDE 20 MEQ/100ML IV SOLN
20.0000 meq | Freq: Once | INTRAVENOUS | Status: AC
  Administered 2022-10-31: 20 meq via INTRAVENOUS

## 2022-10-31 MED ORDER — SODIUM CHLORIDE 0.9 % IV SOLN
INTRAVENOUS | Status: DC
  Filled 2022-10-31: qty 250

## 2022-10-31 NOTE — Progress Notes (Signed)
Pt here for follow up. Reports that she is having issues with her heart and is undergoing cardiology work up.

## 2022-10-31 NOTE — Assessment & Plan Note (Addendum)
#  Right breast invasive carcinoma pT1b pN0 ER negative, PR weakly positive HER 2 positive.  Not on endocrine therapy due to unclear magnitude of benefit Patient does not tolerate adjuvant chemotherapy with Taxol and trastuzumab despite Taxol dose reduction.-->Status post adjuvant radiation--> Finished maintenance Trastuzumab Labs are reviewed and discussed with patient. Bilateral diagnostic mammogram in June 2024

## 2022-10-31 NOTE — Assessment & Plan Note (Addendum)
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 Has not established with acupuncture clinic yet

## 2022-10-31 NOTE — Assessment & Plan Note (Signed)
IV Kcl x 1 today, Recommend patient to increase potassium to BID and have a follow up lab done with her PCP or Cardiologist to trend potassium level.

## 2022-10-31 NOTE — Assessment & Plan Note (Addendum)
She is on calcium 2000 mg daily. . Calcium is stable.

## 2022-10-31 NOTE — Progress Notes (Signed)
Hematology/Oncology Progress note [phone] [phone]     CHIEF COMPLAINTS/REASON FOR VISIT:  Follow-up for stage I HER2 positive breast cancer  ASSESSMENT & PLAN:   Cancer Staging  Malignant neoplasm of upper-outer quadrant of right female breast Staging form: Breast, AJCC 8th Edition - Pathologic stage from 03/03/2021: Stage IA (pT1b, pN0, cM0, G3, ER-, PR+, HER2+) - Signed by [name], MD on 03/03/2021   Malignant neoplasm of upper-outer quadrant of right female breast #Right breast invasive carcinoma pT1b pN0 ER negative, PR weakly positive HER 2 positive.  Not on endocrine therapy due to unclear magnitude of benefit Patient does not tolerate adjuvant chemotherapy with Taxol and trastuzumab despite Taxol dose reduction.-->Status post adjuvant radiation--> Finished maintenance Trastuzumab Labs are reviewed and discussed with patient. Bilateral diagnostic mammogram in June 2024    Atrial fibrillation [facility]) Patient is on Eliquis 5 mg twice daily. Echo showed decreased LVEF 45-50% On Flecainide. Follow-up with cardiology. -she is going to follow up with EP for ablation.   Neuropathy Pre-existing neuropathy in her fingertips bilaterally- secondary to cervical spine radiculopathy Improved, she is off gabapentin. Has not established with acupuncture clinic yet   Hypocalcemia She is on calcium 2000 mg daily. . Calcium is stable.    Hypokalemia IV Kcl x 1 today, Recommend patient to increase potassium to BID and have a follow up lab done with her PCP or Cardiologist to trend potassium level.    Follow up   3 months. All questions were answered. The patient knows to call the clinic with any problems, questions or concerns.  [name], MD, PhD [facility] Hematology Oncology 10/31/2022     HISTORY OF PRESENTING ILLNESS:   Amber Barnes is a  72 y.o.  female presents for follow up of right breast cancer.  Oncology History   Malignant neoplasm of upper-outer quadrant of right female breast  01/19/2021 Initial Diagnosis   Breast cancer in female  -12/30/2020, patient had a screening mammogram done which showed possible mass and asymmetry in the right breast. 01/08/2021, diagnostic mammogram of the right breast showed 2 adjacent irregular spiculated masses.  Larger 1 was 10 mm and a smaller mass measured 5 mm. 01/15/2021 stereotactic biopsy of the right breast smaller mass showed invasive carcinoma no special type, grade 2, DCIS present, LVI negative.Ultrasound guided biopsy of the larger mass showed invasive mammary carcinoma  02/17/2021, patient is status post lumpectomy and sentinel lymph node biopsy Pathology showed invasive mammary carcinoma, 2 separate foci, DCIS, negative sentinel lymph node biopsy 0/1, grade 3, or margins negative for invasive carcinoma.  ER -, PR + [1-10%], HER2 +  pT1b(m) pN0   Strong family history of cancer, Discussed again about genetic testing. patient adamantly declined.   03/03/2021 Cancer Staging   Staging form: Breast, AJCC 8th Edition - Pathologic stage from 03/03/2021: Stage IA (pT1b, pN0, cM0, G3, ER-, PR+, HER2+) - Signed by [name], MD on 03/03/2021 Stage prefix: Initial diagnosis Multigene prognostic tests performed: None Histologic grading system: 3 grade system   03/08/2021 Echocardiogram   echocardiogram showed LVEF 60-65%. Per patient, she has been seen by Dr. Windell Hummingbird team and was cleared for proceeding with chemotherapy.    02/2021 Genetic Testing   Strong family history of cancer, Discussed again about genetic testing. patient adamantly declined.   03/23/2021 Procedure   Mediport by vascular surgeon   03/24/2021 -  Chemotherapy   03/24/2021 - 03/31/2021 Taxol 80 mg/m2 Herby Abraham 04/05/2021 - 04/09/2021 patient was admitted due  to neutropenic fever with associated SIRS.  Patient was treated with empiric cefepime and vancomycin until date of discharge when ANC recovered to  1.4. Post hospitalization, patient also had COVID-19 infection so her follow-up appointment was further postponed for another 2 weeks.  05/10/2021 resumed dose reduce Taxol /m2 Herby Abraham 05/25/2021-06/30/2021, cycle 2 dose reduced Taxol /m2 /trastuzumab   admitted from 07/12/2021 - 07/17/2021 due to acute pyelonephritis UVJ obstruction and a right hydro ureteralnephrosis.  Patient was treated with IV antibiotics status post emergent bilateral ureteral stent placement.  Discharged home with a course of oral antibiotics.  Shared decision made to hold chemotherapy and proceed with radiation.    08/18/2021 - 09/08/2021 Radiation Therapy    adjuvant breast radiation   10/06/2021 -  Chemotherapy   10/06/2021, shared decision was made to proceed with maintenance trastuzumab.  Her cardiologist Dr. Mariah Milling gave clearance to proceed. Plan Transtuzumab for up to a year if tolerates.    12/14/2021 Mammogram   Bilateral diagnostic mammogram 1. Probably benign right breast calcifications along the posterior lumpectomy bed felt to represent early changes of fat necrosis.Recommend short-term imaging follow-up. 2. No mammographic evidence of malignancy on the left.   03/02/2022 Echocardiogram   LVEF 60-65%, grade I diastolic dysfuction   05/31/2022 Echocardiogram   LVEF is 55 to 60%, Grade II diastolic dysfunction    10/20/2022 Echocardiogram   LVEF 45-50%   07/07/2023 Mammogram   Unilateral diagnostic mammogram -right 1. No evidence of malignancy. 2. The previously described probably benign right breast calcifications are benign calcifications associated with fat necrosis and oil cyst formation.   HER2-positive carcinoma of breast    Left butt/thigh tenderness for a few weeks, ultrasound soft tissue left lower extremity negative.   INTERVAL HISTORY [name] is a 72 y.o. female who has above history reviewed by me today presents for follow up visit for management of right side HER 2  positive breast cancer  Recent Echo showed decreased LVEF, she has cardiology work up scheduled tomorrow. On Lasix  BID She takes potassium daily.  Neuropathy has improved. Off Gabapentin.    Review of Systems  Constitutional:  Positive for fatigue. Negative for appetite change, chills, fever and unexpected weight change.  HENT:   Negative for hearing loss and voice change.   Respiratory:  Negative for chest tightness and cough.   Cardiovascular:  Negative for chest pain and leg swelling.  Gastrointestinal:  Negative for abdominal distention, abdominal pain, blood in stool, nausea and vomiting.  Endocrine: Negative for hot flashes.  Genitourinary:  Negative for difficulty urinating, frequency and hematuria.   Musculoskeletal:  Positive for arthralgias.  Skin:  Negative for itching and rash.  Neurological:  Positive for numbness. Negative for dizziness and extremity weakness.  Hematological:  Negative for adenopathy.  Psychiatric/Behavioral:  Negative for confusion.     MEDICAL HISTORY:  Past Medical History:  Diagnosis Date   Achilles tendinitis    Acute medial meniscal tear    Allergy    Anxiety    Atrial fibrillation    a. new onset 07/2016; b. CHADS2VASc --> 4 (HTN, female, age, CHF); c. eliquis   Chemotherapy induced nausea and vomiting 05/18/2021   CHF (congestive heart failure)    Chronic anticoagulation    Apixaban   Chronic lower back pain    Colon polyp    Complication of anesthesia    Emergence delirium; hypoxia   COPD (chronic obstructive pulmonary disease)    Cystic breast    Depression  Endometriosis    Fibrocystic breast disease    GERD (gastroesophageal reflux disease)    History of stress test    a. 07/2016 MV: EF 55-65%. Small, mild defect  in mid anteroseptal, apical septal, and apical locations. No ischemia-->low risk.   Hyperlipidemia    Hypertension    Insomnia    Lipoma    Malignant neoplasm of upper-outer quadrant of right female  breast    Stage 1A invasive mammary carcinoma (cT1b or T1c N0); ER (-), PR (+), HER2/neu (+)   Migraine    Mild Mitral regurgitation    a. 07/2016 Echo: EF 55-65%, no rwma, mild MR. Mildly dil LA. Nl RV fx.    Nephrolithiasis    OSA (obstructive sleep apnea)    non-compliant with nocturnal PAP therapy   Osteoarthritis    left knee   Osteopenia    Peptic ulcer disease    a. H. pylori   Pneumonia    RLS (restless legs syndrome)    Tendonitis    Thyroid disease    Vitamin D deficiency     SURGICAL HISTORY: Past Surgical History:  Procedure Laterality Date   ABDOMINAL HYSTERECTOMY     ovaries left   ACHILLES TENDON SURGERY     2012,01/2017   BACK SURGERY     fusion lumbar   BREAST BIOPSY Right 01/15/2021   Affirm biopsy-"X" clip-INVASIVE MAMMARY CARCINOMA   BREAST BIOPSY Right 01/15/2021   u/s bx-"venus" clip INVASIVE MAMMARY CARCINOMA   BREAST BIOPSY     BREAST LUMPECTOMY,RADIO FREQ LOCALIZER,AXILLARY SENTINEL LYMPH NODE BIOPSY Right 02/17/2021   Procedure: BREAST LUMPECTOMY,RADIO FREQ LOCALIZER,AXILLARY SENTINEL LYMPH NODE BIOPSY;  Surgeon: Campbell Lerner, MD;  Location: [facility];  Service: General;  Laterality: Right;   CARDIOVERSION N/A 09/07/2016   Procedure: CARDIOVERSION;  Surgeon: Antonieta Iba, MD;  Location: [facility];  Service: Cardiovascular;  Laterality: N/A;   CARDIOVERSION N/A 10/19/2016   Procedure: CARDIOVERSION;  Surgeon: Antonieta Iba, MD;  Location: [facility];  Service: Cardiovascular;  Laterality: N/A;   CARDIOVERSION N/A 12/14/2016   Procedure: CARDIOVERSION;  Surgeon: Antonieta Iba, MD;  Location: [facility];  Service: Cardiovascular;  Laterality: N/A;   COLONOSCOPY     COLONOSCOPY WITH PROPOFOL N/A 05/13/2020   Procedure: COLONOSCOPY WITH PROPOFOL;  Surgeon: Toledo, Boykin Nearing, MD;  Location: [facility];  Service: Gastroenterology;  Laterality: N/A;  ELIQUIS   CYSTOSCOPY WITH STENT PLACEMENT Bilateral 07/12/2021   Procedure: CYSTOSCOPY WITH  STENT PLACEMENT;  Surgeon: Sondra Come, MD;  Location: [facility];  Service: Urology;  Laterality: Bilateral;   CYSTOSCOPY/URETEROSCOPY/HOLMIUM LASER/STENT PLACEMENT Bilateral 07/30/2021   Procedure: CYSTOSCOPY/URETEROSCOPY/HOLMIUM LASER/BILATERAL STENT REMOVAL;  Surgeon: Sondra Come, MD;  Location: [facility];  Service: Urology;  Laterality: Bilateral;   ESOPHAGOGASTRODUODENOSCOPY     ESOPHAGOGASTRODUODENOSCOPY (EGD) WITH PROPOFOL N/A 05/13/2020   Procedure: ESOPHAGOGASTRODUODENOSCOPY (EGD) WITH PROPOFOL;  Surgeon: Toledo, Boykin Nearing, MD;  Location: [facility];  Service: Gastroenterology;  Laterality: N/A;   FOOT SURGERY Left    tendon   JOINT REPLACEMENT Right    hip   PORTA CATH INSERTION N/A 03/23/2021   Procedure: PORTA CATH INSERTION;  Surgeon: Renford Dills, MD;  Location: [facility];  Service: Cardiovascular;  Laterality: N/A;   PORTACATH PLACEMENT N/A 03/23/2021   Procedure: ATTEMPTED INSERTION PORT-A-CATH;  Surgeon: Campbell Lerner, MD;  Location: [facility];  Service: General;  Laterality: N/A;   THYROIDECTOMY  1990   half thyroid lobectomy secondary to a benign nodule  TOTAL HIP ARTHROPLASTY Right 08/21/2018   Procedure: TOTAL HIP ARTHROPLASTY ANTERIOR APPROACH-RIGHT CELL SAVER REQUESTED;  Surgeon: Kennedy Bucker, MD;  Location: [facility];  Service: Orthopedics;  Laterality: Right;    SOCIAL HISTORY: Social History   Socioeconomic History   Marital status: Married    Spouse name: jeffrey   Number of children: 1   Years of education: Not on file   Highest education level: High school graduate  Occupational History    Comment: disabled  Tobacco Use   Smoking status: Former    Packs/day: 0.25    Years: 10.00    Additional pack years: 0.00    Total pack years: 2.50    Types: Cigarettes    Quit date: 2005    Years since quitting: 19.3   Smokeless tobacco: Never   Tobacco comments:    smoked for 10 years, 1 pack every 2-3 days  Vaping Use    Vaping Use: Never used  Substance and Sexual Activity   Alcohol use: Not Currently    Alcohol/week: 1.0 standard drink of alcohol    Types: 1 Glasses of wine per week    Comment: rarely   Drug use: No   Sexual activity: Not Currently  Other Topics Concern   Not on file  Social History Narrative   Moved to [place] from [place], originally from [place].    1 cup of caffeine daily   Right handed   Lives with husband, step-daughter and child       Social Determinants of Health   Financial Resource Strain: Low Risk  (12/09/2021)   Overall Financial Resource Strain (CARDIA)    Difficulty of Paying Living Expenses: Not very hard  Food Insecurity: No Food Insecurity (12/09/2021)   Hunger Vital Sign    Worried About Running Out of Food in the Last Year: Never true    Ran Out of Food in the Last Year: Never true  Transportation Needs: No Transportation Needs (12/09/2021)   PRAPARE - Administrator, Civil Service (Medical): No    Lack of Transportation (Non-Medical): No  Physical Activity: Insufficiently Active (12/09/2021)   Exercise Vital Sign    Days of Exercise per Week: 7 days    Minutes of Exercise per Session: 20 min  Stress: Stress Concern Present (12/09/2021)   [employer] of Occupational Health - Occupational Stress Questionnaire    Feeling of Stress : Very much  Social Connections: Moderately Isolated (12/09/2021)   Social Connection and Isolation Panel [NHANES]    Frequency of Communication with Friends and Family: Never    Frequency of Social Gatherings with Friends and Family: Once a week    Attends Religious Services: More than 4 times per year    Active Member of [employer] or Organizations: No    Attends Banker Meetings: Never    Marital Status: Married  Catering manager Violence: Not At Risk (12/09/2021)   Humiliation, Afraid, Rape, and Kick questionnaire    Fear of Current or Ex-Partner: No    Emotionally Abused: No     Physically Abused: No    Sexually Abused: No    FAMILY HISTORY: Family History  Problem Relation Age of Onset   Cancer Mother        leukemia   Cancer Father 36       colon   Heart disease Father    Heart attack Father 92       died from MI   Hyperlipidemia Father  Hypertension Father    Cancer Sister 59       ovarian   Depression Sister    Cancer Brother        CLL   Breast cancer Paternal Aunt        72's   Migraines Neg Hx    Thyroid cancer Neg Hx     ALLERGIES:  is allergic to prednisone, amiodarone, hydrocodone-acetaminophen, oxycodone, tapentadol, tramadol, adhesive [tape], and latex.  MEDICATIONS:  Current Outpatient Medications  Medication Sig Dispense Refill   acetaminophen (TYLENOL) 500 MG tablet Take 500-1,000 mg by mouth every 6 (six) hours as needed for mild pain or moderate pain.     ALPRAZolam (XANAX) 0.25 MG tablet Take 1 tablet (0.25 mg total) by mouth 2 (two) times daily as needed for anxiety. 60 tablet 2   bisoprolol (ZEBETA) 10 MG tablet Take 1 tablet (10 mg total) by mouth 2 (two) times daily. 120 tablet 3   busPIRone (BUSPAR) 5 MG tablet TAKE 1 TABLET BY MOUTH THREE TIMES A DAY AS NEEDED 60 tablet 1   Cholecalciferol (VITAMIN D3) 5000 units CAPS Take 5,000 Units by mouth daily.     clotrimazole (LOTRIMIN) 1 % cream Apply 1 application topically 2 (two) times daily. 30 g 0   ELIQUIS 5 MG TABS tablet TAKE 1 TABLET BY MOUTH TWICE A DAY 180 tablet 1   escitalopram (LEXAPRO) 10 MG tablet Take 1 tablet (10 mg total) by mouth daily. 90 tablet 1   esomeprazole (NEXIUM) 20 MG capsule Take 20 mg by mouth daily as needed (GERD symptoms).     flecainide (TAMBOCOR) 100 MG tablet Take 1 tablet (100 mg total) by mouth 2 (two) times daily. 180 tablet 3   furosemide (LASIX) 20 MG tablet TAKE 1 TABLET BY MOUTH TWICE A DAY 180 tablet 0   KLOR-CON M20 20 MEQ tablet TAKE 1 TABLET BY MOUTH EVERY DAY 90 tablet 1   losartan-hydrochlorothiazide (HYZAAR) 50-12.5 MG tablet  TAKE 1/2 TABLET DAILY 45 tablet 1   metFORMIN (GLUCOPHAGE-XR) 500 MG 24 hr tablet Take 1 tablet by mouth in the morning and 2 tablets by mouth at night 270 tablet 3   Multiple Vitamin (MULTIVITAMIN) tablet Take 1 tablet by mouth daily.     nystatin cream (MYCOSTATIN) Apply 1 application. topically 2 (two) times daily. Prn abdomen 60 g 5   ondansetron (ZOFRAN) 8 MG tablet TAKE 1 TABLET BY MOUTH EVERY 8 HOURS AS NEEDED FOR NAUSEA OR VOMITING. 60 tablet 0   promethazine (PHENERGAN) 25 MG tablet Take 1 tablet (25 mg total) by mouth every 6 (six) hours as needed for nausea or vomiting. 60 tablet 0   ranolazine (RANEXA) 500 MG 12 hr tablet TAKE 1 TABLET BY MOUTH TWICE A DAY 180 tablet 2   rizatriptan (MAXALT) 10 MG tablet TAKE 1 TABLET (10 MG TOTAL) BY MOUTH AS NEEDED FOR MIGRAINE. MAY REPEAT IN 2 HOURS IF NEEDED 10 tablet 0   rOPINIRole (REQUIP) 2 MG tablet TAKE 1 TABLET BY MOUTH EVERYDAY AT BEDTIME 90 tablet 1   rosuvastatin (CRESTOR) 40 MG tablet Take 1 tablet (40 mg total) by mouth daily. 90 tablet 3   diphenhydrAMINE (BENADRYL) 25 mg capsule Take 1 capsule (25 mg total) by mouth every 8 (eight) hours as needed for itching or allergies. (Patient not taking: Reported on 10/31/2022) 30 capsule 0   gabapentin (NEURONTIN) 100 MG capsule TAKE 1 CAPSULE (100 MG TOTAL) BY MOUTH 3 TIMES A DAY (Patient not taking: Reported on  10/31/2022) 270 capsule 3   meclizine (ANTIVERT) 12.5 MG tablet Take 1 tablet (12.5 mg total) by mouth 3 (three) times daily as needed for dizziness. (Patient not taking: Reported on 10/31/2022) 30 tablet 0   No current facility-administered medications for this visit.   Facility-Administered Medications Ordered in Other Visits  Medication Dose Route Frequency Provider Last Rate Last Admin   0.9 %  sodium chloride infusion   Intravenous Continuous [name], MD   Stopped at 10/31/22 1603     PHYSICAL EXAMINATION: ECOG PERFORMANCE STATUS: 1 - Symptomatic but completely  ambulatory Vitals:   10/31/22 1414  BP: (!) 115/93  Pulse: 84  Resp: 18  Temp: (!) 97.2 F (36.2 C)    Filed Weights   10/31/22 1414  Weight: 247 lb 8 oz (112.3 kg)     Physical Exam Constitutional:      General: She is not in acute distress.    Appearance: She is obese.  HENT:     Head: Normocephalic and atraumatic.  Eyes:     General: No scleral icterus. Cardiovascular:     Rate and Rhythm: Normal rate. Rhythm irregular.     Heart sounds: Normal heart sounds.  Pulmonary:     Effort: Pulmonary effort is normal. No respiratory distress.     Breath sounds: No wheezing.  Abdominal:     General: Bowel sounds are normal. There is no distension.     Palpations: Abdomen is soft.  Musculoskeletal:        General: No deformity. Normal range of motion.     Cervical back: Normal range of motion and neck supple.     Comments: 1+ edema bilaterally in lower extremity    Skin:    General: Skin is warm and dry.     Findings: No erythema or rash.  Neurological:     Mental Status: She is alert and oriented to person, place, and time. Mental status is at baseline.     Cranial Nerves: No cranial nerve deficit.     Coordination: Coordination normal.  Psychiatric:        Mood and Affect: Mood normal.     . LABORATORY DATA:  I have reviewed the data as listed    Latest Ref Rng & Units 10/31/2022    1:42 PM 09/06/2022    4:54 PM 07/27/2022   12:50 PM  CBC  WBC 4.0 - 10.5 K/uL 6.3  7.1  7.0   Hemoglobin 12.0 - 15.0 g/dL 16.1  09.6  04.5   Hematocrit 36.0 - 46.0 % 42.2  42.5  43.5   Platelets 150 - 400 K/uL 175  182  189       Latest Ref Rng & Units 10/31/2022    1:42 PM 09/06/2022    4:54 PM 08/10/2022   10:22 AM  CMP  Glucose 70 - 99 mg/dL 409  93  90   BUN 8 - 23 mg/dL 19  19  16    Creatinine 0.44 - 1.00 mg/dL 8.11  9.14  7.82   Sodium 135 - 145 mmol/L 138  139  140   Potassium 3.5 - 5.1 mmol/L 2.9  3.5  3.6   Chloride 98 - 111 mmol/L 97  100  101   CO2 22 - 32 mmol/L  27  26  31    Calcium 8.9 - 10.3 mg/dL 8.8  9.1  9.0    8.3   Total Protein 6.5 - 8.1 g/dL 7.2     Total  Bilirubin 0.3 - 1.2 mg/dL 0.8     Alkaline Phos 38 - 126 U/L 69     AST 15 - 41 U/L 20     ALT 0 - 44 U/L 13         RADIOGRAPHIC STUDIES: I have personally reviewed the radiological images as listed and agreed with the findings in the report. ECHOCARDIOGRAM COMPLETE  Result Date: 10/20/2022    ECHOCARDIOGRAM REPORT   Patient Name:   TRENIYAH LYNN Date of Exam: 10/20/2022 Medical Rec #:  [id number]        Height:       65.0 in Accession #:    [id number]       Weight:       258.4 lb Date of Birth:  1951-06-30        BSA:          2.206 m Patient Age:    63 years         BP:           130/82 mmHg Patient Gender: F                HR:           83 bpm. Exam Location:  [facility] Procedure: 2D Echo, 3D Echo, Cardiac Doppler and Color Doppler Indications:    I48.91* Unspeicified atrial fibrillation  History:        Patient has prior history of Echocardiogram examinations, most                 recent 05/31/2022. CHF, COPD, Arrythmias:Atrial Fibrillation and                 Bradycardia, Signs/Symptoms:Fatigue, Shortness of Breath and                 Dizziness/Lightheadedness; Risk Factors:Former Smoker and                 Hypertension. H/o chemotherapy and radiation. Last chemo                 treatment was in 06/2022.  Sonographer:    Quentin Ore RDMS, RVT, RDCS Referring Phys: [id number] SHERI HAMMOCK  Sonographer Comments: Global longitudinal strain was attempted. IMPRESSIONS  1. Left ventricular ejection fraction, by estimation, is 45 to 50%. Left ventricular ejection fraction by 3D volume is 44 %. The left ventricle has mildly decreased function. The left ventricle demonstrates global hypokinesis. Left ventricular diastolic  parameters are indeterminate. The average left ventricular global longitudinal strain is -8.0 %.  2. Right ventricular systolic function is moderately reduced. The right ventricular  size is normal. There is normal pulmonary artery systolic pressure. The estimated right ventricular systolic pressure is 28.0 mmHg.  3. Left atrial size was mildly dilated.  4. The mitral valve is normal in structure. Mild mitral valve regurgitation. No evidence of mitral stenosis.  5. Tricuspid valve regurgitation is mild to moderate.  6. The aortic valve has an indeterminant number of cusps. Aortic valve regurgitation is not visualized. No aortic stenosis is present.  7. There is borderline dilatation of the ascending aorta, measuring 39 mm.  8. The inferior vena cava is normal in size with greater than 50% respiratory variability, suggesting right atrial pressure of 3 mmHg. FINDINGS  Left Ventricle: Left ventricular ejection fraction, by estimation, is 45 to 50%. Left ventricular ejection fraction by 3D volume is 44 %. The left ventricle has mildly decreased function. The left ventricle demonstrates global hypokinesis. The average  left ventricular global longitudinal strain is -8.0 %. The left ventricular internal cavity size was normal in size. There is no left ventricular hypertrophy. Left ventricular diastolic parameters are indeterminate. Right Ventricle: The right ventricular size is normal. No increase in right ventricular wall thickness. Right ventricular systolic function is moderately reduced. There is normal pulmonary artery systolic pressure. The tricuspid regurgitant velocity is 2.40 m/s, and with an assumed right atrial pressure of 5 mmHg, the estimated right ventricular systolic pressure is 28.0 mmHg. Left Atrium: Left atrial size was mildly dilated. Right Atrium: Right atrial size was normal in size. Pericardium: There is no evidence of pericardial effusion. Mitral Valve: The mitral valve is normal in structure. Mild mitral annular calcification. Mild mitral valve regurgitation. No evidence of mitral valve stenosis. Tricuspid Valve: The tricuspid valve is normal in structure. Tricuspid valve  regurgitation is mild to moderate. No evidence of tricuspid stenosis. Aortic Valve: The aortic valve has an indeterminant number of cusps. Aortic valve regurgitation is not visualized. No aortic stenosis is present. Aortic valve mean gradient measures 2.0 mmHg. Aortic valve peak gradient measures 4.3 mmHg. Aortic valve area, by VTI measures 1.82 cm. Pulmonic Valve: The pulmonic valve was normal in structure. Pulmonic valve regurgitation is not visualized. No evidence of pulmonic stenosis. Aorta: The aortic root is normal in size and structure. There is borderline dilatation of the ascending aorta, measuring 39 mm. Venous: The inferior vena cava is normal in size with greater than 50% respiratory variability, suggesting right atrial pressure of 3 mmHg. IAS/Shunts: No atrial level shunt detected by color flow Doppler.  LEFT VENTRICLE PLAX 2D LVIDd:         5.00 cm LVIDs:         3.70 cm         2D LV PW:         0.90 cm         Longitudinal LV IVS:        0.90 cm         Strain LVOT diam:     1.80 cm         2D Strain GLS  -8.0 % LV SV:         32              Avg: LV SV Index:   15 LVOT Area:     2.54 cm        3D Volume EF                                LV 3D EF:    Left                                             ventricul LV Volumes (MOD)                            ar LV vol d, MOD    93.0 ml                    ejection A2C:  fraction LV vol d, MOD    85.1 ml                    by 3D A4C:                                        volume is LV vol s, MOD    55.3 ml                    44 %. A2C: LV vol s, MOD    49.0 ml A4C:                           3D Volume EF: LV SV MOD A2C:   37.7 ml       3D EF:        44 % LV SV MOD A4C:   85.1 ml       LV EDV:       101 ml LV SV MOD BP:    38.7 ml       LV ESV:       57 ml                                LV SV:        44 ml RIGHT VENTRICLE            IVC RV Basal diam:  3.20 cm    IVC diam: 1.60 cm RV S prime:     9.46 cm/s TAPSE (M-mode):  1.8 cm LEFT ATRIUM             Index LA diam:        4.50 cm 2.04 cm/m LA Vol (A2C):   54.4 ml 24.66 ml/m LA Vol (A4C):   72.9 ml 33.05 ml/m LA Biplane Vol: 68.1 ml 30.87 ml/m  AORTIC VALVE                    PULMONIC VALVE AV Area (Vmax):    1.92 cm     PV Vmax:          1.07 m/s AV Area (Vmean):   1.76 cm     PV Peak grad:     4.6 mmHg AV Area (VTI):     1.82 cm     PR End Diast Vel: 7.18 msec AV Vmax:           104.00 cm/s AV Vmean:          72.700 cm/s AV VTI:            0.178 m AV Peak Grad:      4.3 mmHg AV Mean Grad:      2.0 mmHg LVOT Vmax:         78.30 cm/s LVOT Vmean:        50.400 cm/s LVOT VTI:          0.128 m LVOT/AV VTI ratio: 0.72  AORTA Ao Root diam: 3.10 cm Ao Asc diam:  3.90 cm Ao Arch diam: 2.3 cm TRICUSPID VALVE TR Peak grad:   23.0 mmHg TR Vmax:        240.00 cm/s  SHUNTS Systemic VTI:  0.13 m Systemic Diam: 1.80 cm Julien Nordmann MD Electronically signed by Julien Nordmann  MD Signature Date/Time: 10/20/2022/5:50:57 PM    Final

## 2022-10-31 NOTE — Assessment & Plan Note (Addendum)
Patient is on Eliquis 5 mg twice daily. Echo showed decreased LVEF 45-50% On Flecainide. Follow-up with cardiology. -she is going to follow up with EP for ablation.

## 2022-11-01 ENCOUNTER — Telehealth: Payer: Self-pay

## 2022-11-01 ENCOUNTER — Encounter
Admission: RE | Admit: 2022-11-01 | Discharge: 2022-11-01 | Disposition: A | Discharge status: Home / Self Care | Payer: Medicare Other | Source: Ambulatory Visit | Attending: Cardiology | Admitting: Cardiology

## 2022-11-01 DIAGNOSIS — Z171 Estrogen receptor negative status [ER-]: Secondary | ICD-10-CM

## 2022-11-01 DIAGNOSIS — R072 Precordial pain: Secondary | ICD-10-CM | POA: Diagnosis not present

## 2022-11-01 LAB — CANCER ANTIGEN 15-3: CA 15-3: 18.5 U/mL (ref 0.0–25.0)

## 2022-11-01 LAB — CANCER ANTIGEN 27.29: CA 27.29: 20.9 U/mL (ref 0.0–38.6)

## 2022-11-01 MED ORDER — TECHNETIUM TC 99M TETROFOSMIN IV KIT
32.1600 | PACK | Freq: Once | INTRAVENOUS | Status: AC | PRN
  Administered 2022-11-01: 32.16 via INTRAVENOUS

## 2022-11-01 MED ORDER — REGADENOSON 0.4 MG/5ML IV SOLN
0.4000 mg | Freq: Once | INTRAVENOUS | Status: AC
  Administered 2022-11-01: 0.4 mg via INTRAVENOUS
  Filled 2022-11-01: qty 5

## 2022-11-01 MED ORDER — TECHNETIUM TC 99M TETROFOSMIN IV KIT
10.4000 | PACK | Freq: Once | INTRAVENOUS | Status: AC | PRN
  Administered 2022-11-01: 10.4 via INTRAVENOUS

## 2022-11-01 NOTE — Telephone Encounter (Signed)
-----   Message from [name], MD sent at 10/31/2022  6:28 PM EDT ----- I forget that she needs to have bilateral diagnostic mammogram in June 2024. No new breast concerns.

## 2022-11-01 NOTE — Telephone Encounter (Signed)
Pt is due for  mammogram in June, please schedule and inform pt of appts.

## 2022-11-02 ENCOUNTER — Encounter: Payer: Self-pay | Admitting: Oncology

## 2022-11-02 LAB — NM MYOCAR MULTI W/SPECT W/WALL MOTION / EF
Estimated workload: 1
Exercise duration (min): 0 min
Exercise duration (sec): 0 s
LV dias vol: 128 mL (ref 46–106)
LV sys vol: 61 mL
MPHR: 151 {beats}/min
Nuc Stress EF: 52 %
Peak HR: 99 {beats}/min
Percent HR: 63 %
Rest HR: 81 {beats}/min
Rest Nuclear Isotope Dose: 10.4 mCi
SDS: 6
SRS: 1
SSS: 11
ST Depression (mm): 0 mm
Stress Nuclear Isotope Dose: 32.2 mCi
TID: 1.03

## 2022-11-03 NOTE — Progress Notes (Signed)
Stress testing was considered intermediate risk. There were some changes noted but could be related to artifact. Recommend moving appointment up to discuss symptoms and next steps.

## 2022-11-07 NOTE — Progress Notes (Unsigned)
Date:  11/08/2022   ID:  [name], DOB [birth year], MRN [id number]  Patient Location:  [address] [place] [address]   Provider location:   [facility], [place] office  PCP:  Allegra Grana, FNP  Cardiologist:  Hubbard Robinson [facility]  Chief Complaint  Patient presents with   6 month follow up     Patient c/o twinges in chest with activity, nauseated most days, fatigue, shortness of breath and no energy. Medications reviewed by the patient verbally.     History of Present Illness:    Amber Barnes is a 72 y.o. female past medical history of Paroxysmal atrial fibrillation, cardioversion 2018 Normal sinus rhythm did not hold on flecainide, changed to amiodarone  cardioversion, normal sinus rhythm obesity,  arthritis,  hyperlipidemia,  hypertension,  OSA , not on CPAP Migraines, anxiety Stress test January 2018 showing no ischemia, Ejection fraction 55-65% CHA2DS2-VASc of at least 3 (female, age, HTN). who presents for follow-up of her hyperlipidemia, hypertension, persistent atrial fibrillation  Last seen by myself in clinic October 2023 Has been in atrial fibrillation over the past year, more recently atrial flutter past several months Seen by one of our providers October 27, 2022 Stress test completed November 01, 2022 Perfusion defect noted in the anterior apical, apical septal, apical region, possible small region of ischemia  Echocardiogram ejection fraction 45 to 50% Moderately reduced RV function Mild to moderate TR  Ablation  scheduled June 3rd  Tired all the time, chronic nausea Twinges in her chest Weight continues to drop secondary to nausea  Stopped amiodarone for GI upset 4/21  Continues on Lasix twice a day Denies significant leg swelling  Recent lab work showing very low potassium Was repleted by oncology, started on oral potassium 20 twice daily  Receiving treatment for breast cancer Treated  with maintenance trastuzumab  Systolic pressure today 100, has not taken her morning medications  EKG personally reviewed by myself on todays visit Atrial flutter ventricular rate 84 bpm  Other past medical history reviewed In hospital early January 2023 6 days of IV antibiotic for your kidney infection Acute pyelonephritis and ureterovesical junction (UVJ) obstruction and right hydroureteronephrosis --Dr. Richardo Hanks of urology did urgent Bilateral ureteral stent placement.  bisoprolol and Cardizem are held due to your low heart rate.   Eliquis held due to significant blood in the urine. Was in atrial fib on arrival D/c on 07/17/2021  NSR 07/29/2021 when echo performed Normal ejection fraction greater than 60%  Hx of breast cancer HER2 positive in July 2022, underwent lumpectomy, lymph node biopsy, recent start of chemotherapy with weekly Taxol plus  trastuzumab .    Chemotherapy early September 2022 She presented to the emergency room September 26, anorexia, malaise, shortness of breath, atrial fibrillation Heart rate in the emergency room 137 bpm Started on broad-spectrum antibiotics for possible sepsis, neutropenic fevers Started on diltiazem infusion for rate control, has been continued on her Eliquis Heart rate running low 100 bpm, asymptomatic   cardioversion procedure 09/07/2016  Past Medical History:  Diagnosis Date   Achilles tendinitis    Acute medial meniscal tear    Allergy    Anxiety    Atrial fibrillation (HCC)    a. new onset 07/2016; b. CHADS2VASc --> 4 (HTN, female, age, CHF); c. eliquis   Chemotherapy induced nausea and vomiting 05/18/2021   CHF (congestive heart failure) (HCC)    Chronic anticoagulation    Apixaban  Chronic lower back pain    Colon polyp    Complication of anesthesia    Emergence delirium; hypoxia   COPD (chronic obstructive pulmonary disease) (HCC)    Cystic breast    Depression    Endometriosis    Fibrocystic breast disease    GERD  (gastroesophageal reflux disease)    History of stress test    a. 07/2016 MV: EF 55-65%. Small, mild defect  in mid anteroseptal, apical septal, and apical locations. No ischemia-->low risk.   Hyperlipidemia    Hypertension    Insomnia    Lipoma    Malignant neoplasm of upper-outer quadrant of right female breast (HCC)    Stage 1A invasive mammary carcinoma (cT1b or T1c N0); ER (-), PR (+), HER2/neu (+)   Migraine    Mild Mitral regurgitation    a. 07/2016 Echo: EF 55-65%, no rwma, mild MR. Mildly dil LA. Nl RV fx.    Nephrolithiasis    OSA (obstructive sleep apnea)    non-compliant with nocturnal PAP therapy   Osteoarthritis    left knee   Osteopenia    Peptic ulcer disease    a. H. pylori   Pneumonia    RLS (restless legs syndrome)    Tendonitis    Thyroid disease    Vitamin D deficiency    Past Surgical History:  Procedure Laterality Date   ABDOMINAL HYSTERECTOMY     ovaries left   ACHILLES TENDON SURGERY     2012,01/2017   BACK SURGERY     fusion lumbar   BREAST BIOPSY Right 01/15/2021   Affirm biopsy-"X" clip-INVASIVE MAMMARY CARCINOMA   BREAST BIOPSY Right 01/15/2021   u/s bx-"venus" clip INVASIVE MAMMARY CARCINOMA   BREAST BIOPSY     BREAST LUMPECTOMY,RADIO FREQ LOCALIZER,AXILLARY SENTINEL LYMPH NODE BIOPSY Right 02/17/2021   Procedure: BREAST LUMPECTOMY,RADIO FREQ LOCALIZER,AXILLARY SENTINEL LYMPH NODE BIOPSY;  Surgeon: Campbell Lerner, MD;  Location: [facility];  Service: General;  Laterality: Right;   CARDIOVERSION N/A 09/07/2016   Procedure: CARDIOVERSION;  Surgeon: Antonieta Iba, MD;  Location: [facility];  Service: Cardiovascular;  Laterality: N/A;   CARDIOVERSION N/A 10/19/2016   Procedure: CARDIOVERSION;  Surgeon: Antonieta Iba, MD;  Location: [facility];  Service: Cardiovascular;  Laterality: N/A;   CARDIOVERSION N/A 12/14/2016   Procedure: CARDIOVERSION;  Surgeon: Antonieta Iba, MD;  Location: [facility];  Service: Cardiovascular;  Laterality: N/A;    COLONOSCOPY     COLONOSCOPY WITH PROPOFOL N/A 05/13/2020   Procedure: COLONOSCOPY WITH PROPOFOL;  Surgeon: Toledo, Boykin Nearing, MD;  Location: [facility];  Service: Gastroenterology;  Laterality: N/A;  ELIQUIS   CYSTOSCOPY WITH STENT PLACEMENT Bilateral 07/12/2021   Procedure: CYSTOSCOPY WITH STENT PLACEMENT;  Surgeon: Sondra Come, MD;  Location: [facility];  Service: Urology;  Laterality: Bilateral;   CYSTOSCOPY/URETEROSCOPY/HOLMIUM LASER/STENT PLACEMENT Bilateral 07/30/2021   Procedure: CYSTOSCOPY/URETEROSCOPY/HOLMIUM LASER/BILATERAL STENT REMOVAL;  Surgeon: Sondra Come, MD;  Location: [facility];  Service: Urology;  Laterality: Bilateral;   ESOPHAGOGASTRODUODENOSCOPY     ESOPHAGOGASTRODUODENOSCOPY (EGD) WITH PROPOFOL N/A 05/13/2020   Procedure: ESOPHAGOGASTRODUODENOSCOPY (EGD) WITH PROPOFOL;  Surgeon: Toledo, Boykin Nearing, MD;  Location: [facility];  Service: Gastroenterology;  Laterality: N/A;   FOOT SURGERY Left    tendon   JOINT REPLACEMENT Right    hip   PORTA CATH INSERTION N/A 03/23/2021   Procedure: PORTA CATH INSERTION;  Surgeon: Renford Dills, MD;  Location: [facility];  Service: Cardiovascular;  Laterality: N/A;   PORTACATH PLACEMENT N/A  03/23/2021   Procedure: ATTEMPTED INSERTION PORT-A-CATH;  Surgeon: Campbell Lerner, MD;  Location: [facility];  Service: General;  Laterality: N/A;   THYROIDECTOMY  1990   half thyroid lobectomy secondary to a benign nodule   TOTAL HIP ARTHROPLASTY Right 08/21/2018   Procedure: TOTAL HIP ARTHROPLASTY ANTERIOR APPROACH-RIGHT CELL SAVER REQUESTED;  Surgeon: Kennedy Bucker, MD;  Location: [facility];  Service: Orthopedics;  Laterality: Right;      Allergies:   Prednisone, Amiodarone, Hydrocodone-acetaminophen, Oxycodone, Tapentadol, Tramadol, Adhesive [tape], and Latex   Social History   Tobacco Use   Smoking status: Former    Packs/day: 0.25    Years: 10.00    Additional pack years: 0.00    Total pack years: 2.50     Types: Cigarettes    Quit date: 2005    Years since quitting: 19.3   Smokeless tobacco: Never   Tobacco comments:    smoked for 10 years, 1 pack every 2-3 days  Vaping Use   Vaping Use: Never used  Substance Use Topics   Alcohol use: Not Currently    Alcohol/week: 1.0 standard drink of alcohol    Types: 1 Glasses of wine per week    Comment: rarely   Drug use: No     Current Outpatient Medications on File Prior to Visit  Medication Sig Dispense Refill   acetaminophen (TYLENOL) 500 MG tablet Take 500-1,000 mg by mouth every 6 (six) hours as needed for mild pain or moderate pain.     ALPRAZolam (XANAX) 0.25 MG tablet Take 1 tablet (0.25 mg total) by mouth 2 (two) times daily as needed for anxiety. 60 tablet 2   bisoprolol (ZEBETA) 10 MG tablet Take 1 tablet (10 mg total) by mouth 2 (two) times daily. 120 tablet 3   busPIRone (BUSPAR) 5 MG tablet TAKE 1 TABLET BY MOUTH THREE TIMES A DAY AS NEEDED 60 tablet 1   Cholecalciferol (VITAMIN D3) 5000 units CAPS Take 5,000 Units by mouth daily.     clotrimazole (LOTRIMIN) 1 % cream Apply 1 application topically 2 (two) times daily. 30 g 0   ELIQUIS 5 MG TABS tablet TAKE 1 TABLET BY MOUTH TWICE A DAY 180 tablet 1   escitalopram (LEXAPRO) 10 MG tablet Take 1 tablet (10 mg total) by mouth daily. 90 tablet 1   esomeprazole (NEXIUM) 20 MG capsule Take 20 mg by mouth daily as needed (GERD symptoms).     flecainide (TAMBOCOR) 100 MG tablet Take 1 tablet (100 mg total) by mouth 2 (two) times daily. 180 tablet 3   furosemide (LASIX) 20 MG tablet TAKE 1 TABLET BY MOUTH TWICE A DAY 180 tablet 0   losartan-hydrochlorothiazide (HYZAAR) 50-12.5 MG tablet TAKE 1/2 TABLET DAILY 45 tablet 1   metFORMIN (GLUCOPHAGE-XR) 500 MG 24 hr tablet Take 1 tablet by mouth in the morning and 2 tablets by mouth at night 270 tablet 3   Multiple Vitamin (MULTIVITAMIN) tablet Take 1 tablet by mouth daily.     nystatin cream (MYCOSTATIN) Apply 1 application. topically 2 (two)  times daily. Prn abdomen 60 g 5   potassium chloride SA (KLOR-CON M) 20 MEQ tablet Take 20 mEq by mouth 2 (two) times daily.     promethazine (PHENERGAN) 25 MG tablet Take 1 tablet (25 mg total) by mouth every 6 (six) hours as needed for nausea or vomiting. 60 tablet 0   ranolazine (RANEXA) 500 MG 12 hr tablet TAKE 1 TABLET BY MOUTH TWICE A DAY 180 tablet 2  rizatriptan (MAXALT) 10 MG tablet TAKE 1 TABLET (10 MG TOTAL) BY MOUTH AS NEEDED FOR MIGRAINE. MAY REPEAT IN 2 HOURS IF NEEDED 10 tablet 0   rOPINIRole (REQUIP) 2 MG tablet TAKE 1 TABLET BY MOUTH EVERYDAY AT BEDTIME 90 tablet 1   rosuvastatin (CRESTOR) 40 MG tablet Take 1 tablet (40 mg total) by mouth daily. 90 tablet 3   diphenhydrAMINE (BENADRYL) 25 mg capsule Take 1 capsule (25 mg total) by mouth every 8 (eight) hours as needed for itching or allergies. (Patient not taking: Reported on 10/31/2022) 30 capsule 0   gabapentin (NEURONTIN) 100 MG capsule TAKE 1 CAPSULE (100 MG TOTAL) BY MOUTH 3 TIMES A DAY (Patient not taking: Reported on 10/31/2022) 270 capsule 3   meclizine (ANTIVERT) 12.5 MG tablet Take 1 tablet (12.5 mg total) by mouth 3 (three) times daily as needed for dizziness. (Patient not taking: Reported on 10/31/2022) 30 tablet 0   [DISCONTINUED] prochlorperazine (COMPAZINE) 10 MG tablet Take 1 tablet (10 mg total) by mouth every 6 (six) hours as needed (Nausea or vomiting). 30 tablet 1   No current facility-administered medications on file prior to visit.     Family Hx: The patient's family history includes Breast cancer in her paternal aunt; Cancer in her brother and mother; Cancer (age of onset: 50) in her sister; Cancer (age of onset: 14) in her father; Depression in her sister; Heart attack (age of onset: 29) in her father; Heart disease in her father; Hyperlipidemia in her father; Hypertension in her father. There is no history of Migraines or Thyroid cancer.  ROS:   Please see the history of present illness.    Review of  Systems  Constitutional:  Positive for malaise/fatigue.  HENT: Negative.    Respiratory: Negative.    Cardiovascular: Negative.   Gastrointestinal:  Positive for nausea.  Musculoskeletal: Negative.   Neurological: Negative.   Psychiatric/Behavioral: Negative.    All other systems reviewed and are negative.    Labs/Other Tests and Data Reviewed:    Recent Labs: 09/12/2022: TSH 3.40 10/31/2022: ALT 13; BUN 19; Creatinine, Ser 1.25; Hemoglobin 14.6; Platelets 175; Potassium 2.9; Sodium 138   Recent Lipid Panel Lab Results  Component Value Date/Time   CHOL 192 03/11/2022 11:10 AM   TRIG 114.0 03/11/2022 11:10 AM   HDL 60.50 03/11/2022 11:10 AM   CHOLHDL 3 03/11/2022 11:10 AM   LDLCALC 109 (H) 03/11/2022 11:10 AM    Wt Readings from Last 3 Encounters:  11/08/22 245 lb (111.1 kg)  10/31/22 247 lb 8 oz (112.3 kg)  10/27/22 248 lb (112.5 kg)     Exam:    Vital Signs: Vital signs may also be detailed in the HPI BP 100/62 (BP Location: Left Arm, Patient Position: Sitting, Cuff Size: Large)   Pulse 84   Ht 5\' 5"  (1.651 m)   Wt 245 lb (111.1 kg)   SpO2 97%   BMI 40.77 kg/m   Constitutional:  oriented to person, place, and time. No distress.  HENT:  Head: Grossly normal Eyes:  no discharge. No scleral icterus.  Neck: No JVD, no carotid bruits  Cardiovascular: Irregularly irregular no murmurs appreciated Pulmonary/Chest: Clear to auscultation bilaterally, no wheezes or rails Abdominal: Soft.  no distension.  no tenderness.  Musculoskeletal: Normal range of motion Neurological:  normal muscle tone. Coordination normal. No atrophy Skin: Skin warm and dry Psychiatric: normal affect, pleasant   ASSESSMENT & PLAN:    Problem List Items Addressed This Visit  Cardiology Problems   Atrial fibrillation (HCC) - Primary (Chronic)   Relevant Orders   EKG 12-Lead   Chronic diastolic CHF (congestive heart failure) (HCC)   Relevant Orders   EKG 12-Lead     Other    Chemotherapy induced nausea and vomiting   Relevant Medications   ondansetron (ZOFRAN) 8 MG tablet   Other Visit Diagnoses     Hyperlipidemia LDL goal <100       Essential hypertension       Relevant Orders   EKG 12-Lead   Coronary artery calcification seen on CAT scan       Dyspnea on exertion       Atrial flutter, unspecified type (HCC)          Paroxysmal atrial fibrillation/atrial flutter Having paroxysmal atrial fibrillation since last year/flutter past several months Amiodarone previously held 2021 it would appear for GI upset, nausea Currently on flecainide, bisoprolol 10 twice daily Scheduled for ablation in June Will discuss with EP whether she needs to stay on flecainide at this time given chronic nausea Medication changes as below, increase bisoprolol up to 15 twice daily for better rate control with ambulation  Breast cancer Undergoing infusion, scheduled to finish January 2024 Echocardiogram every 3 months, stable  Diastolic CHF Euvolemic if not dehydrated, hold HCTZ, decrease Lasix down to 20 daily, potassium down to 20 daily Recheck BMP next week  Essential hypertension Given weight loss, low blood pressure, we will hold losartan HCTZ Recent lab work showing prerenal state, we will decrease Lasix down to 20 mg daily from twice daily Decrease potassium down to 20 daily    Total encounter time more than 40 minutes  Greater than 50% was spent in counseling and coordination of care with the patient    Signed, Julien Nordmann, MD  [facility] [address], [place], [place] [address]

## 2022-11-08 ENCOUNTER — Encounter: Payer: Self-pay | Admitting: Cardiovascular Disease

## 2022-11-08 ENCOUNTER — Ambulatory Visit: Payer: Medicare Other | Admitting: Family

## 2022-11-08 ENCOUNTER — Ambulatory Visit: Payer: Medicare Other | Attending: Cardiovascular Disease | Admitting: Cardiovascular Disease

## 2022-11-08 VITALS — BP 100/62 | HR 84 | Ht 65.0 in | Wt 245.0 lb

## 2022-11-08 DIAGNOSIS — Z79899 Other long term (current) drug therapy: Secondary | ICD-10-CM | POA: Diagnosis not present

## 2022-11-08 DIAGNOSIS — R112 Nausea with vomiting, unspecified: Secondary | ICD-10-CM

## 2022-11-08 DIAGNOSIS — I4819 Other persistent atrial fibrillation: Secondary | ICD-10-CM | POA: Diagnosis not present

## 2022-11-08 DIAGNOSIS — T451X5A Adverse effect of antineoplastic and immunosuppressive drugs, initial encounter: Secondary | ICD-10-CM

## 2022-11-08 DIAGNOSIS — I5032 Chronic diastolic (congestive) heart failure: Secondary | ICD-10-CM

## 2022-11-08 DIAGNOSIS — I2510 Atherosclerotic heart disease of native coronary artery without angina pectoris: Secondary | ICD-10-CM | POA: Diagnosis not present

## 2022-11-08 DIAGNOSIS — I4892 Unspecified atrial flutter: Secondary | ICD-10-CM

## 2022-11-08 DIAGNOSIS — R0609 Other forms of dyspnea: Secondary | ICD-10-CM

## 2022-11-08 DIAGNOSIS — I10 Essential (primary) hypertension: Secondary | ICD-10-CM

## 2022-11-08 DIAGNOSIS — E785 Hyperlipidemia, unspecified: Secondary | ICD-10-CM

## 2022-11-08 MED ORDER — BISOPROLOL FUMARATE 10 MG PO TABS: 15.0000 mg | ORAL_TABLET | Freq: Two times a day (BID) | ORAL | 3 refills | Status: DC

## 2022-11-08 MED ORDER — POTASSIUM CHLORIDE CRYS ER 20 MEQ PO TBCR: 20.0000 meq | EXTENDED_RELEASE_TABLET | Freq: Every day | ORAL | 3 refills | Status: DC

## 2022-11-08 MED ORDER — ONDANSETRON HCL 8 MG PO TABS: 8.0000 mg | ORAL_TABLET | Freq: Three times a day (TID) | ORAL | 1 refills | Status: DC | PRN

## 2022-11-08 MED ORDER — FUROSEMIDE 20 MG PO TABS: 20.0000 mg | ORAL_TABLET | Freq: Every day | ORAL | 0 refills | Status: DC

## 2022-11-08 NOTE — Patient Instructions (Addendum)
Medication Instructions:  Please hold the losartan HCT Decrease the lasix down to 20 mg once a day Decrease potassium down to one a day  Please increase the bisoprolol up to 15 mg mg twice a day  If you need a refill on your cardiac medications before your next appointment, please call your pharmacy.   Lab work: Your provider would like for you to return in 1week to have the following labs drawn: BMP.   Please go to the [facility] entrance and check in at the front desk.  You do not need an appointment.  They are open from 7am-6 pm.    Testing/Procedures: No new testing needed  Follow-Up: At [facility], you and your health needs are our priority.  As part of our continuing mission to provide you with exceptional heart care, we have created designated Provider Care Teams.  These Care Teams include your primary Cardiologist (physician) and Advanced Practice Providers (APPs -  Physician Assistants and Nurse Practitioners) who all work together to provide you with the care you need, when you need it.  You will need a follow up appointment in 3 months  Providers on your designated Care Team:   Nicolasa Ducking, NP Eula Listen, PA-C Cadence Fransico Michael, [place]  COVID-19 Vaccine Information can be found at: [email or web address] For questions related to vaccine distribution or appointments, please email [email or web address] or call [phone].

## 2022-11-09 ENCOUNTER — Encounter: Payer: Self-pay | Admitting: Oncology

## 2022-11-09 ENCOUNTER — Ambulatory Visit (INDEPENDENT_AMBULATORY_CARE_PROVIDER_SITE_OTHER): Payer: Medicare Other | Admitting: Family

## 2022-11-09 VITALS — BP 118/78 | HR 101 | Temp 97.7°F | Ht 65.0 in | Wt 243.8 lb

## 2022-11-09 DIAGNOSIS — G2581 Restless legs syndrome: Secondary | ICD-10-CM

## 2022-11-09 DIAGNOSIS — I10 Essential (primary) hypertension: Secondary | ICD-10-CM | POA: Diagnosis not present

## 2022-11-09 DIAGNOSIS — I480 Paroxysmal atrial fibrillation: Secondary | ICD-10-CM

## 2022-11-09 MED ORDER — ROPINIROLE HCL 0.5 MG PO TABS
0.5000 mg | ORAL_TABLET | Freq: Every day | ORAL | 0 refills | Status: DC
Start: 2022-11-09 — End: 2023-02-07

## 2022-11-09 NOTE — Assessment & Plan Note (Addendum)
Chronic fatigue. Continue flecainide 100mg  BID, bisoprolol 10 mg twice daily as managed by Dr. Lalla Brothers, Dr. Mariah Milling.  Scheduled for ablation in June.

## 2022-11-09 NOTE — Assessment & Plan Note (Signed)
Suboptimal control.  Increase Requip to 2.5 mg qhs.

## 2022-11-09 NOTE — Patient Instructions (Signed)
Increase Requip to 2.5 mg at bedtime.  Very nice seeing you today!

## 2022-11-09 NOTE — Progress Notes (Signed)
Assessment & Plan:  Restless leg syndrome Assessment & Plan: Suboptimal control.  Increase Requip to 2.5 mg qhs.  Orders: -     rOPINIRole HCl; Take 1 tablet (0.5 mg total) by mouth at bedtime.  Dispense: 90 tablet; Refill: 0  Primary hypertension Assessment & Plan: She is no longer on losartan hydrochlorothiazide.  Lasix decreased from 40 mg daily to 20 mg daily.  Blood pressure excellent today.  Will monitor   Paroxysmal atrial fibrillation (HCC) Assessment & Plan: Chronic fatigue. Continue flecainide 100mg  BID, bisoprolol 10 mg twice daily as managed by Dr. Lalla Brothers, Dr. Mariah Milling.  Scheduled for ablation in June.      Return precautions given.   Risks, benefits, and alternatives of the medications and treatment plan prescribed today were discussed, and patient expressed understanding.   Education regarding symptom management and diagnosis given to patient on AVS either electronically or printed.  Return in about 4 months (around 03/12/2023).  Rennie Plowman, FNP  Subjective:    Patient ID: Suni Jarnagin, female    DOB: 02/14/1951, 72 y.o.   MRN: [id number]  CC: Naly Schwanz is a 72 y.o. female who presents today for follow up.   HPI: She feels somewhat better today as yesterday she had productive follow-up with Dr. Mariah Milling. blood pressure medications were reduced in the setting of weight loss.   She is ready for ablation she feels her longstanding fatigue is related to atrial fib/atrial flutter.  She had stopped metformin due to ongoing nausea which she attributes to atrial fibrillation.  Nausea did not improve when she stopped metformin.  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  She is compliant with Nexium 20 mg.  She complains of restless legs at bedtime.  Compliant with Requip 2 mg.  Restless legs interferes with sleep.    She has decreased Lasix to 20 mg qd from 40mg  QD  Follow up dr Mariah Milling yesterday for paroxysmal atrial  fibrillation/atrial flutter.  Continue flecainide, bisoprolol 10 mg twice daily.  Scheduled for ablation in June.  Diastolic CHF.  Advised to hold losartan - HCTZ and decrease Lasix 20 mg daily and potassium to 20 mg daily.  Recheck BMP in 1 week.  She has lost weight.  Allergies: Prednisone, Amiodarone, Hydrocodone-acetaminophen, Oxycodone, Tapentadol, Tramadol, Adhesive [tape], and Latex Current Outpatient Medications on File Prior to Visit  Medication Sig Dispense Refill   acetaminophen (TYLENOL) 500 MG tablet Take 500-1,000 mg by mouth every 6 (six) hours as needed for mild pain or moderate pain.     ALPRAZolam (XANAX) 0.25 MG tablet Take 1 tablet (0.25 mg total) by mouth 2 (two) times daily as needed for anxiety. 60 tablet 2   bisoprolol (ZEBETA) 10 MG tablet Take 1.5 tablets (15 mg total) by mouth 2 (two) times daily. 270 tablet 3   busPIRone (BUSPAR) 5 MG tablet TAKE 1 TABLET BY MOUTH THREE TIMES A DAY AS NEEDED 60 tablet 1   Cholecalciferol (VITAMIN D3) 5000 units CAPS Take 5,000 Units by mouth daily.     clotrimazole (LOTRIMIN) 1 % cream Apply 1 application topically 2 (two) times daily. 30 g 0   diphenhydrAMINE (BENADRYL) 25 mg capsule Take 1 capsule (25 mg total) by mouth every 8 (eight) hours as needed for itching or allergies. 30 capsule 0   ELIQUIS 5 MG TABS tablet TAKE 1 TABLET BY MOUTH TWICE A DAY 180 tablet 1   escitalopram (LEXAPRO) 10 MG tablet Take 1 tablet (10 mg total) by mouth  daily. 90 tablet 1   esomeprazole (NEXIUM) 20 MG capsule Take 20 mg by mouth daily as needed (GERD symptoms).     flecainide (TAMBOCOR) 100 MG tablet Take 1 tablet (100 mg total) by mouth 2 (two) times daily. 180 tablet 3   furosemide (LASIX) 20 MG tablet Take 1 tablet (20 mg total) by mouth daily. 180 tablet 0   meclizine (ANTIVERT) 12.5 MG tablet Take 1 tablet (12.5 mg total) by mouth 3 (three) times daily as needed for dizziness. 30 tablet 0   Multiple Vitamin (MULTIVITAMIN) tablet Take 1 tablet by  mouth daily.     nystatin cream (MYCOSTATIN) Apply 1 application. topically 2 (two) times daily. Prn abdomen 60 g 5   ondansetron (ZOFRAN) 8 MG tablet Take 1 tablet (8 mg total) by mouth every 8 (eight) hours as needed. 60 tablet 1   potassium chloride SA (KLOR-CON M) 20 MEQ tablet Take 1 tablet (20 mEq total) by mouth daily. 90 tablet 3   promethazine (PHENERGAN) 25 MG tablet Take 1 tablet (25 mg total) by mouth every 6 (six) hours as needed for nausea or vomiting. 60 tablet 0   ranolazine (RANEXA) 500 MG 12 hr tablet TAKE 1 TABLET BY MOUTH TWICE A DAY 180 tablet 2   rizatriptan (MAXALT) 10 MG tablet TAKE 1 TABLET (10 MG TOTAL) BY MOUTH AS NEEDED FOR MIGRAINE. MAY REPEAT IN 2 HOURS IF NEEDED 10 tablet 0   rOPINIRole (REQUIP) 2 MG tablet TAKE 1 TABLET BY MOUTH EVERYDAY AT BEDTIME 90 tablet 1   rosuvastatin (CRESTOR) 40 MG tablet Take 1 tablet (40 mg total) by mouth daily. 90 tablet 3   [DISCONTINUED] prochlorperazine (COMPAZINE) 10 MG tablet Take 1 tablet (10 mg total) by mouth every 6 (six) hours as needed (Nausea or vomiting). 30 tablet 1   No current facility-administered medications on file prior to visit.    Review of Systems  Constitutional:  Positive for fatigue. Negative for chills and fever.  Respiratory:  Negative for cough and shortness of breath.   Cardiovascular:  Negative for chest pain and palpitations.  Gastrointestinal:  Positive for nausea. Negative for vomiting.      Objective:    BP 118/78   Pulse (!) 101   Temp 97.7 F (36.5 C) (Oral)   Ht 5\' 5"  (1.651 m)   Wt 243 lb 12.8 oz (110.6 kg)   SpO2 97%   BMI 40.57 kg/m  BP Readings from Last 3 Encounters:  11/09/22 118/78  11/08/22 100/62  10/31/22 (!) 115/93   Wt Readings from Last 3 Encounters:  11/09/22 243 lb 12.8 oz (110.6 kg)  11/08/22 245 lb (111.1 kg)  10/31/22 247 lb 8 oz (112.3 kg)   Wt 260lbs 05/09/2022   Physical Exam Vitals reviewed.  Constitutional:      Appearance: She is well-developed.   Eyes:     Conjunctiva/sclera: Conjunctivae normal.  Cardiovascular:     Rate and Rhythm: Normal rate. Rhythm regularly irregular.     Pulses: Normal pulses.     Heart sounds: Normal heart sounds.  Pulmonary:     Effort: Pulmonary effort is normal.     Breath sounds: Normal breath sounds. No wheezing, rhonchi or rales.  Skin:    General: Skin is warm and dry.  Neurological:     Mental Status: She is alert.  Psychiatric:        Speech: Speech normal.        Behavior: Behavior normal.  Thought Content: Thought content normal.

## 2022-11-09 NOTE — Assessment & Plan Note (Addendum)
She is no longer on losartan hydrochlorothiazide.  Lasix decreased from 40 mg daily to 20 mg daily.  Blood pressure excellent today.  Will monitor

## 2022-11-14 ENCOUNTER — Telehealth: Payer: Self-pay | Admitting: *Deleted

## 2022-11-14 ENCOUNTER — Other Ambulatory Visit: Payer: Self-pay | Admitting: Cardiovascular Disease

## 2022-11-14 NOTE — Telephone Encounter (Signed)
Patient is returning call.  °

## 2022-11-14 NOTE — Telephone Encounter (Signed)
Spoke with patient and reviewed provider recommendations. She verbalized understanding with no further questions at this time.  

## 2022-11-14 NOTE — Telephone Encounter (Signed)
-----   Message from Antonieta Iba, MD sent at 11/12/2022  2:28 PM EDT ----- Talked with Dr. Lalla Brothers, would recommend she stay on her flecainide preablation Thx TGollan  ----- Message ----- From: Lanier Prude, MD Sent: 11/10/2022   2:35 PM EDT To: Antonieta Iba, MD  I would keep her on it preablation. CL  ----- Message ----- From: Antonieta Iba, MD Sent: 11/08/2022  12:46 PM EDT To: Lanier Prude, MD  Seen in clinic, chronic nausea, weight loss Scheduled for ablation June A-fib now transitioning to flutter x 1 year Does she need to stay on flecainide preablation? Thx TG

## 2022-11-14 NOTE — Telephone Encounter (Signed)
Attempted to call patient to inform her to stay on the flecainide (TAMBOCOR) 100 MG tablet as recommended by Dr. Lalla Brothers but no answer and unable to leave message.

## 2022-11-16 ENCOUNTER — Other Ambulatory Visit: Payer: Self-pay | Admitting: Family

## 2022-11-16 ENCOUNTER — Inpatient Hospital Stay: Payer: Medicare Other

## 2022-11-16 DIAGNOSIS — F32A Depression, unspecified: Secondary | ICD-10-CM

## 2022-11-17 ENCOUNTER — Other Ambulatory Visit
Admission: RE | Admit: 2022-11-17 | Discharge: 2022-11-17 | Disposition: A | Discharge status: Home / Self Care | Payer: Medicare Other | Source: Ambulatory Visit | Attending: Cardiovascular Disease | Admitting: Cardiovascular Disease

## 2022-11-17 DIAGNOSIS — Z79899 Other long term (current) drug therapy: Secondary | ICD-10-CM | POA: Insufficient documentation

## 2022-11-17 LAB — BASIC METABOLIC PANEL
Anion gap: 8 (ref 5–15)
BUN: 14 mg/dL (ref 8–23)
CO2: 28 mmol/L (ref 22–32)
Calcium: 8.4 mg/dL — ABNORMAL LOW (ref 8.9–10.3)
Chloride: 104 mmol/L (ref 98–111)
Creatinine, Ser: 1.09 mg/dL — ABNORMAL HIGH (ref 0.44–1.00)
GFR, Estimated: 55 mL/min — ABNORMAL LOW (ref >60)
Glucose, Bld: 95 mg/dL (ref 70–99)
Potassium: 4.1 mmol/L (ref 3.5–5.1)
Sodium: 140 mmol/L (ref 135–145)

## 2022-11-23 ENCOUNTER — Other Ambulatory Visit: Payer: Self-pay | Admitting: Family

## 2022-11-23 DIAGNOSIS — G2581 Restless legs syndrome: Secondary | ICD-10-CM

## 2022-11-30 ENCOUNTER — Other Ambulatory Visit: Payer: Self-pay | Admitting: Family

## 2022-12-01 ENCOUNTER — Other Ambulatory Visit: Payer: Medicare Other

## 2022-12-07 ENCOUNTER — Telehealth (HOSPITAL_COMMUNITY): Payer: Self-pay | Admitting: Emergency Medicine

## 2022-12-07 NOTE — Telephone Encounter (Signed)
Unable to leave vm Soumya Colson RN Navigator Cardiac [facility] [phone] Office  [phone] Cell  

## 2022-12-08 ENCOUNTER — Ambulatory Visit
Admission: RE | Admit: 2022-12-08 | Discharge: 2022-12-08 | Disposition: A | Discharge status: Home / Self Care | Payer: Medicare Other | Source: Ambulatory Visit | Attending: Cardiology | Admitting: Cardiology

## 2022-12-08 DIAGNOSIS — I5032 Chronic diastolic (congestive) heart failure: Secondary | ICD-10-CM | POA: Insufficient documentation

## 2022-12-08 DIAGNOSIS — Z79899 Other long term (current) drug therapy: Secondary | ICD-10-CM | POA: Insufficient documentation

## 2022-12-08 DIAGNOSIS — I4819 Other persistent atrial fibrillation: Secondary | ICD-10-CM | POA: Diagnosis not present

## 2022-12-08 MED ORDER — IOHEXOL 350 MG/ML SOLN
100.0000 mL | Freq: Once | INTRAVENOUS | Status: AC | PRN
  Administered 2022-12-08: 100 mL via INTRAVENOUS

## 2022-12-09 NOTE — Pre-Procedure Instructions (Signed)
Instructed patient on the following items: Arrival time 1130 Nothing to eat or drink after midnight No meds AM of procedure Responsible person to drive you home and stay with you for 24 hrs  Have you missed any doses of anti-coagulant Eliquis- Takes twice a day, hasn't missed any doses.  Don't take dose on Monday morning.

## 2022-12-12 ENCOUNTER — Ambulatory Visit (HOSPITAL_COMMUNITY)
Admission: RE | Admit: 2022-12-12 | Discharge: 2022-12-12 | Disposition: A | Discharge status: Home / Self Care | Payer: Medicare Other | Attending: Cardiology | Admitting: Cardiology

## 2022-12-12 ENCOUNTER — Ambulatory Visit (HOSPITAL_COMMUNITY): Payer: Medicare Other | Admitting: Certified Registered Nurse Anesthetist

## 2022-12-12 ENCOUNTER — Other Ambulatory Visit (HOSPITAL_COMMUNITY): Payer: Self-pay

## 2022-12-12 ENCOUNTER — Ambulatory Visit (HOSPITAL_BASED_OUTPATIENT_CLINIC_OR_DEPARTMENT_OTHER): Payer: Medicare Other | Admitting: Certified Registered Nurse Anesthetist

## 2022-12-12 ENCOUNTER — Encounter (HOSPITAL_COMMUNITY): Payer: Self-pay | Admitting: Cardiology

## 2022-12-12 ENCOUNTER — Encounter (HOSPITAL_COMMUNITY)
Admission: RE | Disposition: A | Discharge status: Home / Self Care | Payer: Medicare Other | Source: Home / Self Care | Attending: Cardiology

## 2022-12-12 DIAGNOSIS — Z7901 Long term (current) use of anticoagulants: Secondary | ICD-10-CM | POA: Diagnosis not present

## 2022-12-12 DIAGNOSIS — J449 Chronic obstructive pulmonary disease, unspecified: Secondary | ICD-10-CM

## 2022-12-12 DIAGNOSIS — I4891 Unspecified atrial fibrillation: Secondary | ICD-10-CM

## 2022-12-12 DIAGNOSIS — I5032 Chronic diastolic (congestive) heart failure: Secondary | ICD-10-CM | POA: Diagnosis not present

## 2022-12-12 DIAGNOSIS — I110 Hypertensive heart disease with heart failure: Secondary | ICD-10-CM | POA: Insufficient documentation

## 2022-12-12 DIAGNOSIS — I10 Essential (primary) hypertension: Secondary | ICD-10-CM | POA: Diagnosis not present

## 2022-12-12 DIAGNOSIS — Z8249 Family history of ischemic heart disease and other diseases of the circulatory system: Secondary | ICD-10-CM | POA: Insufficient documentation

## 2022-12-12 DIAGNOSIS — I319 Disease of pericardium, unspecified: Secondary | ICD-10-CM

## 2022-12-12 DIAGNOSIS — I483 Typical atrial flutter: Secondary | ICD-10-CM | POA: Diagnosis not present

## 2022-12-12 DIAGNOSIS — I4819 Other persistent atrial fibrillation: Secondary | ICD-10-CM | POA: Diagnosis not present

## 2022-12-12 DIAGNOSIS — Z853 Personal history of malignant neoplasm of breast: Secondary | ICD-10-CM | POA: Diagnosis not present

## 2022-12-12 DIAGNOSIS — Z87891 Personal history of nicotine dependence: Secondary | ICD-10-CM

## 2022-12-12 HISTORY — DX: Disease of pericardium, unspecified: I31.9

## 2022-12-12 HISTORY — PX: ATRIAL FIBRILLATION ABLATION: EP1191

## 2022-12-12 LAB — CBC
HCT: 39.6 % (ref 36.0–46.0)
Hemoglobin: 13.5 g/dL (ref 12.0–15.0)
MCH: 31.8 pg (ref 26.0–34.0)
MCHC: 34.1 g/dL (ref 30.0–36.0)
MCV: 93.4 fL (ref 80.0–100.0)
Platelets: 159 10*3/uL (ref 150–400)
RBC: 4.24 MIL/uL (ref 3.87–5.11)
RDW: 13.7 % (ref 11.5–15.5)
WBC: 5.1 10*3/uL (ref 4.0–10.5)
nRBC: 0 % (ref 0.0–0.2)

## 2022-12-12 LAB — POCT ACTIVATED CLOTTING TIME
Activated Clotting Time: 347 seconds
Activated Clotting Time: 396 seconds

## 2022-12-12 SURGERY — ATRIAL FIBRILLATION ABLATION
Anesthesia: General

## 2022-12-12 MED ORDER — SODIUM CHLORIDE 0.9 % IV SOLN: INTRAVENOUS | Status: DC

## 2022-12-12 MED ORDER — PANTOPRAZOLE SODIUM 40 MG PO TBEC
40.0000 mg | DELAYED_RELEASE_TABLET | Freq: Every day | ORAL | 0 refills | Status: DC
  Filled 2022-12-12: qty 45, 45d supply, fill #0

## 2022-12-12 MED ORDER — ONDANSETRON HCL 4 MG/2ML IJ SOLN
INTRAMUSCULAR | Status: DC | PRN
  Administered 2022-12-12: 4 mg via INTRAVENOUS

## 2022-12-12 MED ORDER — SODIUM CHLORIDE 0.9% FLUSH: 3.0000 mL | Freq: Two times a day (BID) | INTRAVENOUS | Status: DC

## 2022-12-12 MED ORDER — ROCURONIUM BROMIDE 10 MG/ML (PF) SYRINGE
PREFILLED_SYRINGE | INTRAVENOUS | Status: DC | PRN
  Administered 2022-12-12: 80 mg via INTRAVENOUS

## 2022-12-12 MED ORDER — HEPARIN SODIUM (PORCINE) 1000 UNIT/ML IJ SOLN
INTRAMUSCULAR | Status: AC
  Filled 2022-12-12: qty 10

## 2022-12-12 MED ORDER — HEPARIN SODIUM (PORCINE) 1000 UNIT/ML IJ SOLN
INTRAMUSCULAR | Status: DC | PRN
  Administered 2022-12-12: 1000 [IU] via INTRAVENOUS

## 2022-12-12 MED ORDER — COLCHICINE 0.6 MG PO TABS
0.6000 mg | ORAL_TABLET | Freq: Two times a day (BID) | ORAL | 0 refills | Status: DC
  Filled 2022-12-12: qty 10, 5d supply, fill #0

## 2022-12-12 MED ORDER — MIDAZOLAM HCL 2 MG/2ML IJ SOLN
INTRAMUSCULAR | Status: DC | PRN
  Administered 2022-12-12: 1 mg via INTRAVENOUS

## 2022-12-12 MED ORDER — ONDANSETRON HCL 4 MG/2ML IJ SOLN: 4.0000 mg | Freq: Four times a day (QID) | INTRAMUSCULAR | Status: DC | PRN

## 2022-12-12 MED ORDER — HEPARIN SODIUM (PORCINE) 1000 UNIT/ML IJ SOLN
INTRAMUSCULAR | Status: DC | PRN
  Administered 2022-12-12: 17000 [IU] via INTRAVENOUS

## 2022-12-12 MED ORDER — SODIUM CHLORIDE 0.9 % IV SOLN: 250.0000 mL | INTRAVENOUS | Status: DC | PRN

## 2022-12-12 MED ORDER — PROPOFOL 10 MG/ML IV BOLUS
INTRAVENOUS | Status: DC | PRN
  Administered 2022-12-12: 150 mg via INTRAVENOUS

## 2022-12-12 MED ORDER — SUGAMMADEX SODIUM 200 MG/2ML IV SOLN
INTRAVENOUS | Status: DC | PRN
  Administered 2022-12-12: 200 mg via INTRAVENOUS
  Administered 2022-12-12: 100 mg via INTRAVENOUS

## 2022-12-12 MED ORDER — FENTANYL CITRATE (PF) 250 MCG/5ML IJ SOLN
INTRAMUSCULAR | Status: DC | PRN
  Administered 2022-12-12: 50 ug via INTRAVENOUS

## 2022-12-12 MED ORDER — APIXABAN 5 MG PO TABS
5.0000 mg | ORAL_TABLET | Freq: Two times a day (BID) | ORAL | Status: DC
  Administered 2022-12-12: 5 mg via ORAL
  Filled 2022-12-12 (×2): qty 1

## 2022-12-12 MED ORDER — PHENYLEPHRINE HCL-NACL 20-0.9 MG/250ML-% IV SOLN
INTRAVENOUS | Status: DC | PRN
  Administered 2022-12-12: 20 ug/min via INTRAVENOUS

## 2022-12-12 MED ORDER — ACETAMINOPHEN 500 MG PO TABS
ORAL_TABLET | ORAL | Status: AC
  Filled 2022-12-12: qty 2

## 2022-12-12 MED ORDER — SODIUM CHLORIDE 0.9% FLUSH: 3.0000 mL | INTRAVENOUS | Status: DC | PRN

## 2022-12-12 MED ORDER — HEPARIN (PORCINE) IN NACL 1000-0.9 UT/500ML-% IV SOLN
INTRAVENOUS | Status: DC | PRN
  Administered 2022-12-12 (×3): 500 mL

## 2022-12-12 MED ORDER — COLCHICINE 0.6 MG PO TABS
0.6000 mg | ORAL_TABLET | Freq: Two times a day (BID) | ORAL | Status: DC
  Administered 2022-12-12: 0.6 mg via ORAL
  Filled 2022-12-12 (×2): qty 1

## 2022-12-12 MED ORDER — PHENYLEPHRINE 80 MCG/ML (10ML) SYRINGE FOR IV PUSH (FOR BLOOD PRESSURE SUPPORT)
PREFILLED_SYRINGE | INTRAVENOUS | Status: DC | PRN
  Administered 2022-12-12 (×2): 80 ug via INTRAVENOUS

## 2022-12-12 MED ORDER — LIDOCAINE 2% (20 MG/ML) 5 ML SYRINGE
INTRAMUSCULAR | Status: DC | PRN
  Administered 2022-12-12: 80 mg via INTRAVENOUS

## 2022-12-12 MED ORDER — ACETAMINOPHEN 500 MG PO TABS
1000.0000 mg | ORAL_TABLET | Freq: Once | ORAL | Status: AC
  Administered 2022-12-12: 1000 mg via ORAL

## 2022-12-12 MED ORDER — PANTOPRAZOLE SODIUM 40 MG PO TBEC
40.0000 mg | DELAYED_RELEASE_TABLET | Freq: Every day | ORAL | Status: DC
  Administered 2022-12-12: 40 mg via ORAL
  Filled 2022-12-12 (×2): qty 1

## 2022-12-12 MED ORDER — PROTAMINE SULFATE 10 MG/ML IV SOLN
INTRAVENOUS | Status: DC | PRN
  Administered 2022-12-12: 5 mg via INTRAVENOUS
  Administered 2022-12-12 (×3): 10 mg via INTRAVENOUS

## 2022-12-12 SURGICAL SUPPLY — 21 items
BAG SNAP BAND KOVER 36X36 (MISCELLANEOUS) IMPLANT
BLANKET WARM UNDERBOD FULL ACC (MISCELLANEOUS) ×1 IMPLANT
CATH 8FR REPROCESSED SOUNDSTAR (CATHETERS) ×1 IMPLANT
CATH 8FR SOUNDSTAR REPROCESSED (CATHETERS) IMPLANT
CATH ABLAT QDOT MICRO BI TC DF (CATHETERS) IMPLANT
CATH OCTARAY 2.0 F 3-3-3-3-3 (CATHETERS) IMPLANT
CATH S-M CIRCA TEMP PROBE (CATHETERS) IMPLANT
CATH WEB BI DIR CSDF CRV REPRO (CATHETERS) IMPLANT
CLOSURE PERCLOSE PROSTYLE (VASCULAR PRODUCTS) IMPLANT
COVER SWIFTLINK CONNECTOR (BAG) ×1 IMPLANT
MAT PREVALON FULL STRYKER (MISCELLANEOUS) IMPLANT
PACK EP LATEX FREE (CUSTOM PROCEDURE TRAY) ×1
PACK EP LF (CUSTOM PROCEDURE TRAY) ×1 IMPLANT
PAD DEFIB RADIO PHYSIO CONN (PAD) ×1 IMPLANT
PATCH CARTO3 (PAD) IMPLANT
SHEATH BAYLIS TRANSSEPTAL 98CM (NEEDLE) IMPLANT
SHEATH CARTO VIZIGO SM CVD (SHEATH) IMPLANT
SHEATH PINNACLE 8F 10CM (SHEATH) IMPLANT
SHEATH PINNACLE 9F 10CM (SHEATH) IMPLANT
SHEATH PROBE COVER 6X72 (BAG) IMPLANT
TUBING SMART ABLATE COOLFLOW (TUBING) IMPLANT

## 2022-12-12 NOTE — H&P (Signed)
Electrophysiology Office Note:     Date:  12/12/2022    ID:  Amber Barnes, DOB [birth year], MRN [id number]   PCP:  Allegra Grana, FNP      [facility] Cardiologist:  Julien Nordmann, MD  [facility] Electrophysiologist:  Lanier Prude, MD    Referring MD: Allegra Grana, FNP    Chief Complaint: AF   History of Present Illness:     Amber Barnes is a 72 y.o. female who presents for an evaluation of AF at the request of Dr Mariah Milling. Their medical history includes breast cancer, HFpEF, AF on Eliquis, COPD, OSA.   The patient was last seen by Dr Mariah Milling 05/09/2022. She was previously on flecainide but continued to have episodes of AF. Previously prescribed amiodarone but this was stopped due to GI side effects. Also with history of hematuria. Previously was cardioverted.    She feels very fatigued while in atrial fibrillation.  She tells me that yesterday she was in normal rhythm and was able to walk without much limitation.  Today she could barely get in from the parking lot because she knows she is back in A-fib.  This is confirmed on today's EKG.  She has been taking her diltiazem daily for at least the last 5 days because of her atrial fibrillation.   Presents for PVI today.     Objective      Past Medical History:  Diagnosis Date   Achilles tendinitis     Acute medial meniscal tear     Allergy     Anxiety     Atrial fibrillation (HCC)      a. new onset 07/2016; b. CHADS2VASc --> 4 (HTN, female, age, CHF); c. eliquis   Chemotherapy induced nausea and vomiting 05/18/2021   CHF (congestive heart failure) (HCC)     Chronic anticoagulation      Apixaban   Chronic lower back pain     Colon polyp     Complication of anesthesia      Emergence delirium; hypoxia   COPD (chronic obstructive pulmonary disease) (HCC)     Cystic breast     Depression     Endometriosis     Fibrocystic breast disease     GERD (gastroesophageal reflux disease)     History of stress  test      a. 07/2016 MV: EF 55-65%. Small, mild defect  in mid anteroseptal, apical septal, and apical locations. No ischemia-->low risk.   Hyperlipidemia     Hypertension     Insomnia     Lipoma     Malignant neoplasm of upper-outer quadrant of right female breast (HCC)      Stage 1A invasive mammary carcinoma (cT1b or T1c N0); ER (-), PR (+), HER2/neu (+)   Migraine     Mild Mitral regurgitation      a. 07/2016 Echo: EF 55-65%, no rwma, mild MR. Mildly dil LA. Nl RV fx.    Nephrolithiasis     OSA (obstructive sleep apnea)      non-compliant with nocturnal PAP therapy   Osteoarthritis      left knee   Osteopenia     Peptic ulcer disease      a. H. pylori   Pneumonia     RLS (restless legs syndrome)     Tendonitis     Thyroid disease     Vitamin D deficiency             Past Surgical History:  Procedure Laterality Date   ABDOMINAL HYSTERECTOMY        ovaries left   ACHILLES TENDON SURGERY        2012,01/2017   BACK SURGERY        fusion lumbar   BREAST BIOPSY Right 01/15/2021    Affirm biopsy-"X" clip-INVASIVE MAMMARY CARCINOMA   BREAST BIOPSY Right 01/15/2021    u/s bx-"venus" clip INVASIVE MAMMARY CARCINOMA   BREAST BIOPSY       BREAST LUMPECTOMY,RADIO FREQ LOCALIZER,AXILLARY SENTINEL LYMPH NODE BIOPSY Right 02/17/2021    Procedure: BREAST LUMPECTOMY,RADIO FREQ LOCALIZER,AXILLARY SENTINEL LYMPH NODE BIOPSY;  Surgeon: Campbell Lerner, MD;  Location: [facility];  Service: General;  Laterality: Right;   CARDIOVERSION N/A 09/07/2016    Procedure: CARDIOVERSION;  Surgeon: Antonieta Iba, MD;  Location: [facility];  Service: Cardiovascular;  Laterality: N/A;   CARDIOVERSION N/A 10/19/2016    Procedure: CARDIOVERSION;  Surgeon: Antonieta Iba, MD;  Location: [facility];  Service: Cardiovascular;  Laterality: N/A;   CARDIOVERSION N/A 12/14/2016    Procedure: CARDIOVERSION;  Surgeon: Antonieta Iba, MD;  Location: [facility];  Service: Cardiovascular;  Laterality: N/A;    COLONOSCOPY       COLONOSCOPY WITH PROPOFOL N/A 05/13/2020    Procedure: COLONOSCOPY WITH PROPOFOL;  Surgeon: Toledo, Boykin Nearing, MD;  Location: [facility];  Service: Gastroenterology;  Laterality: N/A;  ELIQUIS   CYSTOSCOPY WITH STENT PLACEMENT Bilateral 07/12/2021    Procedure: CYSTOSCOPY WITH STENT PLACEMENT;  Surgeon: Sondra Come, MD;  Location: [facility];  Service: Urology;  Laterality: Bilateral;   CYSTOSCOPY/URETEROSCOPY/HOLMIUM LASER/STENT PLACEMENT Bilateral 07/30/2021    Procedure: CYSTOSCOPY/URETEROSCOPY/HOLMIUM LASER/BILATERAL STENT REMOVAL;  Surgeon: Sondra Come, MD;  Location: [facility];  Service: Urology;  Laterality: Bilateral;   ESOPHAGOGASTRODUODENOSCOPY       ESOPHAGOGASTRODUODENOSCOPY (EGD) WITH PROPOFOL N/A 05/13/2020    Procedure: ESOPHAGOGASTRODUODENOSCOPY (EGD) WITH PROPOFOL;  Surgeon: Toledo, Boykin Nearing, MD;  Location: [facility];  Service: Gastroenterology;  Laterality: N/A;   FOOT SURGERY Left      tendon   JOINT REPLACEMENT Right      hip   PORTA CATH INSERTION N/A 03/23/2021    Procedure: PORTA CATH INSERTION;  Surgeon: Renford Dills, MD;  Location: [facility];  Service: Cardiovascular;  Laterality: N/A;   PORTACATH PLACEMENT N/A 03/23/2021    Procedure: ATTEMPTED INSERTION PORT-A-CATH;  Surgeon: Campbell Lerner, MD;  Location: [facility];  Service: General;  Laterality: N/A;   THYROIDECTOMY   1990    half thyroid lobectomy secondary to a benign nodule   TOTAL HIP ARTHROPLASTY Right 08/21/2018    Procedure: TOTAL HIP ARTHROPLASTY ANTERIOR APPROACH-RIGHT CELL SAVER REQUESTED;  Surgeon: Kennedy Bucker, MD;  Location: [facility];  Service: Orthopedics;  Laterality: Right;      Current Medications: Active Medications      Current Meds  Medication Sig   acetaminophen (TYLENOL) 500 MG tablet Take 500-1,000 mg by mouth every 6 (six) hours as needed for mild pain or moderate pain.   ALPRAZolam (XANAX) 0.25 MG tablet Take 1 tablet (0.25 mg  total) by mouth 2 (two) times daily as needed for anxiety.   apixaban (ELIQUIS) 5 MG TABS tablet TAKE 1 TABLET BY MOUTH TWICE A DAY   bisoprolol (ZEBETA) 10 MG tablet Take 1 tablet (10 mg total) by mouth 2 (two) times daily.   busPIRone (BUSPAR) 5 MG tablet TAKE 1 TABLET BY MOUTH THREE TIMES A DAY AS NEEDED   Cholecalciferol (VITAMIN D3) 5000 units CAPS Take  5,000 Units by mouth daily.   clotrimazole (LOTRIMIN) 1 % cream Apply 1 application topically 2 (two) times daily.   diltiazem (CARDIZEM CD) 240 MG 24 hr capsule Take 240 mg by mouth as needed.   diphenhydrAMINE (BENADRYL) 25 mg capsule Take 1 capsule (25 mg total) by mouth every 8 (eight) hours as needed for itching or allergies.   esomeprazole (NEXIUM) 20 MG capsule Take 20 mg by mouth daily as needed (GERD symptoms).   flecainide (TAMBOCOR) 100 MG tablet Take 1 tablet (100 mg total) by mouth 2 (two) times daily.   FLUoxetine (PROZAC) 20 MG capsule Take 1 capsule (20 mg total) by mouth every morning.   furosemide (LASIX) 20 MG tablet TAKE 1 TABLET BY MOUTH TWICE A DAY   gabapentin (NEURONTIN) 100 MG capsule TAKE 1 CAPSULE (100 MG TOTAL) BY MOUTH 3 TIMES A DAY   KLOR-CON M20 20 MEQ tablet TAKE 1 TABLET BY MOUTH EVERY DAY   Liraglutide -Weight Management (SAXENDA) 18 MG/3ML SOPN Inject 0.6 mg into the skin daily.   losartan-hydrochlorothiazide (HYZAAR) 50-12.5 MG tablet Take half tablet once a day.   meclizine (ANTIVERT) 12.5 MG tablet Take 1 tablet (12.5 mg total) by mouth 3 (three) times daily as needed for dizziness.   Multiple Vitamin (MULTIVITAMIN) tablet Take 1 tablet by mouth daily.   nystatin (MYCOSTATIN/NYSTOP) powder Apply 1 application. topically 3 (three) times daily.   nystatin cream (MYCOSTATIN) Apply 1 application. topically 2 (two) times daily. Prn abdomen   ondansetron (ZOFRAN) 8 MG tablet TAKE 1 TABLET BY MOUTH EVERY 8 HOURS AS NEEDED FOR NAUSEA OR VOMITING.   promethazine (PHENERGAN) 25 MG tablet Take 1 tablet (25 mg  total) by mouth every 6 (six) hours as needed for nausea or vomiting.   ranolazine (RANEXA) 500 MG 12 hr tablet TAKE 1 TABLET BY MOUTH TWICE A DAY   rizatriptan (MAXALT) 10 MG tablet TAKE 1 TABLET (10 MG TOTAL) BY MOUTH AS NEEDED FOR MIGRAINE. MAY REPEAT IN 2 HOURS IF NEEDED   rOPINIRole (REQUIP) 2 MG tablet TAKE 1 TABLET BY MOUTH EVERYDAY AT BEDTIME   rosuvastatin (CRESTOR) 20 MG tablet TAKE 1 TABLET BY MOUTH EVERY DAY   rosuvastatin (CRESTOR) 40 MG tablet Take 1 tablet (40 mg total) by mouth daily.        Allergies:   Prednisone, Amiodarone, Hydrocodone-acetaminophen, Oxycodone, Tapentadol, Tramadol, Adhesive [tape], and Latex    Social History         Socioeconomic History   Marital status: Married      Spouse name: jeffrey   Number of children: 1   Years of education: Not on file   Highest education level: High school graduate  Occupational History      Comment: disabled  Tobacco Use   Smoking status: Former      Packs/day: 0.25      Years: 10.00      Total pack years: 2.50      Types: Cigarettes      Quit date: 2005      Years since quitting: 19.0   Smokeless tobacco: Never   Tobacco comments:      smoked for 10 years, 1 pack every 2-3 days  Vaping Use   Vaping Use: Never used  Substance and Sexual Activity   Alcohol use: Not Currently      Alcohol/week: 1.0 standard drink of alcohol      Types: 1 Glasses of wine per week      Comment: rarely  Drug use: No   Sexual activity: Not Currently  Other Topics Concern   Not on file  Social History Narrative    Moved to [place] from [place], originally from [place].     1 cup of caffeine daily    Right handed    Lives with husband, step-daughter and child         Social Determinants of Health        Financial Resource Strain: Low Risk  (12/09/2021)    Overall Financial Resource Strain (CARDIA)     Difficulty of Paying Living Expenses: Not very hard  Food Insecurity: No Food Insecurity  (12/09/2021)    Hunger Vital Sign     Worried About Running Out of Food in the Last Year: Never true     Ran Out of Food in the Last Year: Never true  Transportation Needs: No Transportation Needs (12/09/2021)    PRAPARE - Therapist, art (Medical): No     Lack of Transportation (Non-Medical): No  Physical Activity: Insufficiently Active (12/09/2021)    Exercise Vital Sign     Days of Exercise per Week: 7 days     Minutes of Exercise per Session: 20 min  Stress: Stress Concern Present (12/09/2021)    [employer] of Occupational Health - Occupational Stress Questionnaire     Feeling of Stress : Very much  Social Connections: Moderately Isolated (12/09/2021)    Social Connection and Isolation Panel [NHANES]     Frequency of Communication with Friends and Family: Never     Frequency of Social Gatherings with Friends and Family: Once a week     Attends Religious Services: More than 4 times per year     Active Member of [employer] or Organizations: No     Attends Banker Meetings: Never     Marital Status: Married      Family History: The patient's family history includes Breast cancer in her paternal aunt; Cancer in her brother and mother; Cancer (age of onset: 88) in her sister; Cancer (age of onset: 70) in her father; Depression in her sister; Heart attack (age of onset: 42) in her father; Heart disease in her father; Hyperlipidemia in her father; Hypertension in her father. There is no history of Migraines or Thyroid cancer.   ROS:   Please see the history of present illness.    All other systems reviewed and are negative.   EKGs/Labs/Other Studies Reviewed:     The following studies were reviewed today:   05/2022 echo - EF55, RV normal, mild-mod MR   05/09/2022 ECG shows AF 11/08/2021 ECG shows sinus bradycardia   EKG:  The ekg ordered today demonstrates atrial fibrillation with a slow ventricular response at 55 bpm.     Recent Labs: 07/27/2022:  ALT 18; BUN 19; Creatinine, Ser 0.87; Hemoglobin 14.4; Platelets 189; Potassium 3.7; Sodium 139  Recent Lipid Panel Labs (Brief)          Component Value Date/Time    CHOL 192 03/11/2022 1110    TRIG 114.0 03/11/2022 1110    HDL 60.50 03/11/2022 1110    CHOLHDL 3 03/11/2022 1110    VLDL 22.8 03/11/2022 1110    LDLCALC 109 (H) 03/11/2022 1110        Physical Exam:     VS:  BP 139/101   Pulse 101   Ht 5\' 5"  (1.651 m)   Wt 267 lb (121.1 kg)   SpO2  98%   BMI 44.43 kg/m         Wt Readings from Last 3 Encounters:  08/10/22 267 lb (121.1 kg)  08/10/22 264 lb 3.2 oz (119.8 kg)  07/28/22 259 lb (117.5 kg)      GEN:  Well nourished, well developed in no acute distress.  Obese CARDIAC: Irregularly irregular, no murmurs, rubs, gallops RESPIRATORY:  Clear to auscultation without rales, wheezing or rhonchi  PSYCHIATRIC:  Normal affect          Assessment ASSESSMENT:     1. Persistent atrial fibrillation (HCC)   2. Encounter for long-term (current) use of high-risk medication   3. Chronic diastolic CHF (congestive heart failure) (HCC)     PLAN:     In order of problems listed above:     #Persistent AF Symptomatic episodes despite treatment with flecainide.   Discussed treatment options today for their AF including antiarrhythmic drug therapy and ablation. Discussed risks, recovery and likelihood of success. Discussed potential need for repeat ablation procedures and antiarrhythmic drugs after an initial ablation. They wish to proceed with scheduling.   Risk, benefits, and alternatives to EP study and radiofrequency ablation for afib were also discussed in detail today. These risks include but are not limited to stroke, bleeding, vascular damage, tamponade, perforation, damage to the esophagus, lungs, and other structures, pulmonary vein stenosis, worsening renal function, and death. The patient understands these risk and wishes to proceed.  We will therefore proceed with  catheter ablation at the next available time.  Carto, ICE, anesthesia are requested for the procedure.  Will also obtain CT PV protocol prior to the procedure to exclude LAA thrombus and further evaluate atrial anatomy.   Continue Eliquis.   #HFpEF NYHA II. Warm and dry on exam. Continue current medical therapy.    Presents for PVI today. Procedure reviewed.      Signed, Rossie Muskrat. Lalla Brothers, MD, [facility], [facility] 12/12/2022 Electrophysiology [facility]

## 2022-12-12 NOTE — Discharge Instructions (Signed)

## 2022-12-12 NOTE — Anesthesia Postprocedure Evaluation (Signed)
Anesthesia Post Note  Patient: Amber Barnes  Procedure(s) Performed: ATRIAL FIBRILLATION ABLATION     Patient location during evaluation: PACU Anesthesia Type: General Level of consciousness: awake and alert Pain management: pain level controlled Vital Signs Assessment: post-procedure vital signs reviewed and stable Respiratory status: spontaneous breathing, nonlabored ventilation and respiratory function stable Cardiovascular status: blood pressure returned to baseline and stable Postop Assessment: no apparent nausea or vomiting Anesthetic complications: no  No notable events documented.  Last Vitals:  Vitals:   12/12/22 1610 12/12/22 1615  BP: 121/67 116/71  Pulse: 61 68  Resp: 17 14  Temp:    SpO2: 92% 94%    Last Pain:  Vitals:   12/12/22 1605  TempSrc: Temporal  PainSc:                  Ivalene Platte,W. EDMOND

## 2022-12-12 NOTE — Anesthesia Procedure Notes (Signed)
Procedure Name: Intubation Date/Time: 12/12/2022 1:48 PM  Performed by: Cy Blamer, CRNAPre-anesthesia Checklist: Patient identified, Emergency Drugs available, Suction available and Patient being monitored Patient Re-evaluated:Patient Re-evaluated prior to induction Oxygen Delivery Method: Circle system utilized Preoxygenation: Pre-oxygenation with 100% oxygen Induction Type: IV induction Ventilation: Two handed mask ventilation required Laryngoscope Size: Miller and 2 Grade View: Grade II Tube type: Oral Number of attempts: 1 Airway Equipment and Method: Stylet and Bite block Placement Confirmation: ETT inserted through vocal cords under direct vision, positive ETCO2 and breath sounds checked- equal and bilateral Secured at: 21 cm Tube secured with: Tape Dental Injury: Teeth and Oropharynx as per pre-operative assessment

## 2022-12-12 NOTE — Transfer of Care (Signed)
Immediate Anesthesia Transfer of Care Note  Patient: Amber Barnes  Procedure(s) Performed: ATRIAL FIBRILLATION ABLATION  Patient Location: Cath Lab  Anesthesia Type:General  Level of Consciousness: awake, alert , oriented, patient cooperative, and responds to stimulation  Airway & Oxygen Therapy: Patient Spontanous Breathing and Patient connected to nasal cannula oxygen  Post-op Assessment: Report given to RN and Post -op Vital signs reviewed and stable  Post vital signs: Reviewed and stable  Last Vitals:  Vitals Value Taken Time  BP 117/66 12/12/22 1545  Temp 36.8 C 12/12/22 1544  Pulse 61 12/12/22 1546  Resp 17 12/12/22 1546  SpO2 96 % 12/12/22 1546  Vitals shown include unvalidated device data.  Last Pain:  Vitals:   12/12/22 1544  TempSrc: Temporal  PainSc: 0-No pain         Complications: No notable events documented.

## 2022-12-12 NOTE — Anesthesia Preprocedure Evaluation (Addendum)
Anesthesia Evaluation  Patient identified by MRN, date of birth, ID band Patient awake    Reviewed: Allergy & Precautions, H&P , NPO status , Patient's Chart, lab work & pertinent test results, reviewed documented beta blocker date and time   Airway Mallampati: III  TM Distance: >3 FB Neck ROM: Full    Dental no notable dental hx. (+) Teeth Intact, Dental Advisory Given   Pulmonary sleep apnea , COPD, former smoker   Pulmonary exam normal breath sounds clear to auscultation       Cardiovascular hypertension, Pt. on medications and Pt. on home beta blockers + dysrhythmias Atrial Fibrillation  Rhythm:Irregular Rate:Normal     Neuro/Psych  Headaches  Anxiety Depression       GI/Hepatic Neg liver ROS, PUD,GERD  Medicated,,  Endo/Other  negative endocrine ROS    Renal/GU negative Renal ROS  negative genitourinary   Musculoskeletal   Abdominal   Peds  Hematology  (+) Blood dyscrasia, anemia   Anesthesia Other Findings   Reproductive/Obstetrics negative OB ROS                             Anesthesia Physical Anesthesia Plan  ASA: 3  Anesthesia Plan: General   Post-op Pain Management: Tylenol PO (pre-op)*   Induction: Intravenous  PONV Risk Score and Plan: 4 or greater and Ondansetron, Dexamethasone and Treatment may vary due to age or medical condition  Airway Management Planned: Oral ETT  Additional Equipment:   Intra-op Plan:   Post-operative Plan: Extubation in OR  Informed Consent: I have reviewed the patients History and Physical, chart, labs and discussed the procedure including the risks, benefits and alternatives for the proposed anesthesia with the patient or authorized representative who has indicated his/her understanding and acceptance.     Dental advisory given  Plan Discussed with: CRNA  Anesthesia Plan Comments:        Anesthesia Quick Evaluation

## 2022-12-13 ENCOUNTER — Emergency Department (HOSPITAL_BASED_OUTPATIENT_CLINIC_OR_DEPARTMENT_OTHER)
Admit: 2022-12-13 | Discharge: 2022-12-13 | Disposition: A | Discharge status: Home / Self Care | Payer: Medicare Other | Attending: Internal Medicine | Admitting: Internal Medicine

## 2022-12-13 ENCOUNTER — Ambulatory Visit (HOSPITAL_COMMUNITY): Payer: Medicare Other | Admitting: Physician Assistant

## 2022-12-13 ENCOUNTER — Telehealth: Payer: Self-pay | Admitting: Family

## 2022-12-13 ENCOUNTER — Telehealth: Payer: Self-pay | Admitting: Cardiology

## 2022-12-13 ENCOUNTER — Observation Stay
Admission: EM | Admit: 2022-12-13 | Discharge: 2022-12-14 | Disposition: A | Discharge status: Home / Self Care | Payer: Medicare Other | Attending: Student in an Organized Health Care Education/Training Program | Admitting: Student in an Organized Health Care Education/Training Program

## 2022-12-13 ENCOUNTER — Emergency Department: Payer: Medicare Other

## 2022-12-13 ENCOUNTER — Encounter: Payer: Self-pay | Admitting: Emergency Medicine

## 2022-12-13 ENCOUNTER — Telehealth (INDEPENDENT_AMBULATORY_CARE_PROVIDER_SITE_OTHER): Payer: Medicare Other | Admitting: Family

## 2022-12-13 ENCOUNTER — Other Ambulatory Visit: Payer: Self-pay

## 2022-12-13 ENCOUNTER — Encounter (HOSPITAL_COMMUNITY): Payer: Self-pay | Admitting: Cardiology

## 2022-12-13 VITALS — Ht 65.0 in | Wt 244.2 lb

## 2022-12-13 DIAGNOSIS — I5032 Chronic diastolic (congestive) heart failure: Secondary | ICD-10-CM | POA: Insufficient documentation

## 2022-12-13 DIAGNOSIS — I309 Acute pericarditis, unspecified: Secondary | ICD-10-CM | POA: Diagnosis not present

## 2022-12-13 DIAGNOSIS — Z7901 Long term (current) use of anticoagulants: Secondary | ICD-10-CM | POA: Insufficient documentation

## 2022-12-13 DIAGNOSIS — Z87891 Personal history of nicotine dependence: Secondary | ICD-10-CM | POA: Diagnosis not present

## 2022-12-13 DIAGNOSIS — I5A Non-ischemic myocardial injury (non-traumatic): Secondary | ICD-10-CM | POA: Diagnosis not present

## 2022-12-13 DIAGNOSIS — J430 Unilateral pulmonary emphysema [MacLeod's syndrome]: Secondary | ICD-10-CM | POA: Diagnosis not present

## 2022-12-13 DIAGNOSIS — N182 Chronic kidney disease, stage 2 (mild): Secondary | ICD-10-CM | POA: Insufficient documentation

## 2022-12-13 DIAGNOSIS — Z853 Personal history of malignant neoplasm of breast: Secondary | ICD-10-CM | POA: Insufficient documentation

## 2022-12-13 DIAGNOSIS — I10 Essential (primary) hypertension: Secondary | ICD-10-CM | POA: Diagnosis not present

## 2022-12-13 DIAGNOSIS — K279 Peptic ulcer, site unspecified, unspecified as acute or chronic, without hemorrhage or perforation: Secondary | ICD-10-CM | POA: Diagnosis not present

## 2022-12-13 DIAGNOSIS — E7849 Other hyperlipidemia: Secondary | ICD-10-CM | POA: Diagnosis not present

## 2022-12-13 DIAGNOSIS — Z96641 Presence of right artificial hip joint: Secondary | ICD-10-CM | POA: Diagnosis not present

## 2022-12-13 DIAGNOSIS — K219 Gastro-esophageal reflux disease without esophagitis: Secondary | ICD-10-CM | POA: Diagnosis present

## 2022-12-13 DIAGNOSIS — F32A Depression, unspecified: Secondary | ICD-10-CM | POA: Diagnosis not present

## 2022-12-13 DIAGNOSIS — R079 Chest pain, unspecified: Secondary | ICD-10-CM

## 2022-12-13 DIAGNOSIS — I3139 Other pericardial effusion (noninflammatory): Secondary | ICD-10-CM | POA: Diagnosis not present

## 2022-12-13 DIAGNOSIS — I480 Paroxysmal atrial fibrillation: Secondary | ICD-10-CM | POA: Diagnosis not present

## 2022-12-13 DIAGNOSIS — I130 Hypertensive heart and chronic kidney disease with heart failure and stage 1 through stage 4 chronic kidney disease, or unspecified chronic kidney disease: Secondary | ICD-10-CM | POA: Insufficient documentation

## 2022-12-13 DIAGNOSIS — I4891 Unspecified atrial fibrillation: Secondary | ICD-10-CM | POA: Diagnosis not present

## 2022-12-13 DIAGNOSIS — I319 Disease of pericardium, unspecified: Secondary | ICD-10-CM | POA: Diagnosis not present

## 2022-12-13 DIAGNOSIS — Z91040 Latex allergy status: Secondary | ICD-10-CM | POA: Insufficient documentation

## 2022-12-13 DIAGNOSIS — J449 Chronic obstructive pulmonary disease, unspecified: Secondary | ICD-10-CM | POA: Diagnosis not present

## 2022-12-13 DIAGNOSIS — F419 Anxiety disorder, unspecified: Secondary | ICD-10-CM

## 2022-12-13 DIAGNOSIS — Z79899 Other long term (current) drug therapy: Secondary | ICD-10-CM | POA: Diagnosis not present

## 2022-12-13 DIAGNOSIS — I308 Other forms of acute pericarditis: Secondary | ICD-10-CM | POA: Diagnosis not present

## 2022-12-13 DIAGNOSIS — N6310 Unspecified lump in the right breast, unspecified quadrant: Secondary | ICD-10-CM | POA: Diagnosis not present

## 2022-12-13 DIAGNOSIS — R0789 Other chest pain: Secondary | ICD-10-CM | POA: Diagnosis not present

## 2022-12-13 DIAGNOSIS — E785 Hyperlipidemia, unspecified: Secondary | ICD-10-CM | POA: Diagnosis present

## 2022-12-13 HISTORY — DX: Chronic diastolic (congestive) heart failure: I50.32

## 2022-12-13 HISTORY — DX: Chronic kidney disease, stage 2 (mild): N18.2

## 2022-12-13 HISTORY — DX: Rheumatic tricuspid insufficiency: I07.1

## 2022-12-13 HISTORY — DX: Paroxysmal atrial fibrillation: I48.0

## 2022-12-13 LAB — ECHOCARDIOGRAM LIMITED
Calc EF: 54.3 %
Height: 65 in
S' Lateral: 3.6 cm
Single Plane A2C EF: 41.7 %
Single Plane A4C EF: 63.2 %
Weight: 3904 oz

## 2022-12-13 LAB — BASIC METABOLIC PANEL
Anion gap: 6 (ref 5–15)
BUN: 14 mg/dL (ref 8–23)
CO2: 29 mmol/L (ref 22–32)
Calcium: 8.5 mg/dL — ABNORMAL LOW (ref 8.9–10.3)
Chloride: 103 mmol/L (ref 98–111)
Creatinine, Ser: 0.85 mg/dL (ref 0.44–1.00)
GFR, Estimated: >60 mL/min (ref >60)
Glucose, Bld: 127 mg/dL — ABNORMAL HIGH (ref 70–99)
Potassium: 3.7 mmol/L (ref 3.5–5.1)
Sodium: 138 mmol/L (ref 135–145)

## 2022-12-13 LAB — PROTIME-INR
INR: 1.2 (ref 0.8–1.2)
Prothrombin Time: 15.2 seconds (ref 11.4–15.2)

## 2022-12-13 LAB — CBC
HCT: 40.6 % (ref 36.0–46.0)
Hemoglobin: 13.3 g/dL (ref 12.0–15.0)
MCH: 31.9 pg (ref 26.0–34.0)
MCHC: 32.8 g/dL (ref 30.0–36.0)
MCV: 97.4 fL (ref 80.0–100.0)
Platelets: 149 10*3/uL — ABNORMAL LOW (ref 150–400)
RBC: 4.17 MIL/uL (ref 3.87–5.11)
RDW: 14.2 % (ref 11.5–15.5)
WBC: 11.3 10*3/uL — ABNORMAL HIGH (ref 4.0–10.5)
nRBC: 0 % (ref 0.0–0.2)

## 2022-12-13 LAB — TROPONIN I (HIGH SENSITIVITY)
Troponin I (High Sensitivity): 1791 ng/L (ref <18)
Troponin I (High Sensitivity): 1506 ng/L (ref <18)
Troponin I (High Sensitivity): 1475 ng/L (ref <18)

## 2022-12-13 LAB — SEDIMENTATION RATE: Sed Rate: 31 mm/hr — ABNORMAL HIGH (ref 0–30)

## 2022-12-13 LAB — BRAIN NATRIURETIC PEPTIDE: B Natriuretic Peptide: 76.9 pg/mL (ref 0.0–100.0)

## 2022-12-13 LAB — HEPARIN LEVEL (UNFRACTIONATED): Heparin Unfractionated: >1.1 IU/mL — ABNORMAL HIGH (ref 0.30–0.70)

## 2022-12-13 LAB — APTT: aPTT: 30 seconds (ref 24–36)

## 2022-12-13 MED ORDER — PANTOPRAZOLE SODIUM 40 MG PO TBEC
40.0000 mg | DELAYED_RELEASE_TABLET | Freq: Every day | ORAL | Status: DC
  Administered 2022-12-13: 40 mg via ORAL
  Filled 2022-12-13: qty 1

## 2022-12-13 MED ORDER — BISOPROLOL FUMARATE 5 MG PO TABS
15.0000 mg | ORAL_TABLET | Freq: Two times a day (BID) | ORAL | Status: DC
  Administered 2022-12-13 – 2022-12-14 (×2): 15 mg via ORAL
  Filled 2022-12-13 (×2): qty 3

## 2022-12-13 MED ORDER — COLCHICINE 0.6 MG PO TABS
0.6000 mg | ORAL_TABLET | Freq: Two times a day (BID) | ORAL | Status: DC
  Administered 2022-12-13 – 2022-12-14 (×3): 0.6 mg via ORAL
  Filled 2022-12-13 (×3): qty 1

## 2022-12-13 MED ORDER — RANOLAZINE ER 500 MG PO TB12
500.0000 mg | ORAL_TABLET | Freq: Two times a day (BID) | ORAL | Status: DC
  Administered 2022-12-13 – 2022-12-14 (×2): 500 mg via ORAL
  Filled 2022-12-13 (×2): qty 1

## 2022-12-13 MED ORDER — ESCITALOPRAM OXALATE 10 MG PO TABS
10.0000 mg | ORAL_TABLET | Freq: Every day | ORAL | Status: DC
  Administered 2022-12-13 – 2022-12-14 (×2): 10 mg via ORAL
  Filled 2022-12-13 (×2): qty 1

## 2022-12-13 MED ORDER — ROSUVASTATIN CALCIUM 10 MG PO TABS
40.0000 mg | ORAL_TABLET | Freq: Every day | ORAL | Status: DC
  Administered 2022-12-13: 40 mg via ORAL
  Filled 2022-12-13: qty 2

## 2022-12-13 MED ORDER — ACETAMINOPHEN 325 MG PO TABS: 650.0000 mg | ORAL_TABLET | Freq: Four times a day (QID) | ORAL | Status: DC | PRN

## 2022-12-13 MED ORDER — IBUPROFEN 400 MG PO TABS
600.0000 mg | ORAL_TABLET | Freq: Two times a day (BID) | ORAL | Status: DC
  Administered 2022-12-13 – 2022-12-14 (×2): 600 mg via ORAL
  Filled 2022-12-13 (×2): qty 2

## 2022-12-13 MED ORDER — DM-GUAIFENESIN ER 30-600 MG PO TB12: 1.0000 | ORAL_TABLET | Freq: Two times a day (BID) | ORAL | Status: DC | PRN

## 2022-12-13 MED ORDER — VITAMIN D 25 MCG (1000 UNIT) PO TABS
5000.0000 [IU] | ORAL_TABLET | Freq: Every day | ORAL | Status: DC
  Administered 2022-12-14: 5000 [IU] via ORAL
  Filled 2022-12-13: qty 5

## 2022-12-13 MED ORDER — ONDANSETRON HCL 4 MG/2ML IJ SOLN: 4.0000 mg | Freq: Three times a day (TID) | INTRAMUSCULAR | Status: DC | PRN

## 2022-12-13 MED ORDER — MORPHINE SULFATE (PF) 4 MG/ML IV SOLN
4.0000 mg | Freq: Once | INTRAVENOUS | Status: AC
  Administered 2022-12-13: 4 mg via INTRAVENOUS
  Filled 2022-12-13: qty 1

## 2022-12-13 MED ORDER — APIXABAN 5 MG PO TABS
5.0000 mg | ORAL_TABLET | Freq: Two times a day (BID) | ORAL | Status: DC
  Administered 2022-12-13 – 2022-12-14 (×2): 5 mg via ORAL
  Filled 2022-12-13 (×2): qty 1

## 2022-12-13 MED ORDER — ALBUTEROL SULFATE (2.5 MG/3ML) 0.083% IN NEBU: 3.0000 mL | INHALATION_SOLUTION | RESPIRATORY_TRACT | Status: DC | PRN

## 2022-12-13 MED ORDER — IBUPROFEN 800 MG PO TABS
800.0000 mg | ORAL_TABLET | Freq: Three times a day (TID) | ORAL | Status: DC
  Administered 2022-12-13: 800 mg via ORAL
  Filled 2022-12-13: qty 1

## 2022-12-13 MED ORDER — ALPRAZOLAM 0.25 MG PO TABS: 0.2500 mg | ORAL_TABLET | Freq: Two times a day (BID) | ORAL | Status: DC | PRN

## 2022-12-13 MED ORDER — FLECAINIDE ACETATE 50 MG PO TABS
100.0000 mg | ORAL_TABLET | Freq: Two times a day (BID) | ORAL | Status: DC
  Administered 2022-12-13 – 2022-12-14 (×2): 100 mg via ORAL
  Filled 2022-12-13: qty 2
  Filled 2022-12-13: qty 1

## 2022-12-13 MED ORDER — HYDRALAZINE HCL 20 MG/ML IJ SOLN: 5.0000 mg | INTRAMUSCULAR | Status: DC | PRN

## 2022-12-13 MED ORDER — ROPINIROLE HCL 1 MG PO TABS
2.5000 mg | ORAL_TABLET | Freq: Every day | ORAL | Status: DC
  Administered 2022-12-13: 2.5 mg via ORAL
  Filled 2022-12-13: qty 2.5

## 2022-12-13 MED ORDER — ASPIRIN 81 MG PO CHEW
324.0000 mg | CHEWABLE_TABLET | Freq: Once | ORAL | Status: DC
  Filled 2022-12-13: qty 4

## 2022-12-13 MED ORDER — ADULT MULTIVITAMIN W/MINERALS CH
1.0000 | ORAL_TABLET | Freq: Every day | ORAL | Status: DC
  Administered 2022-12-14: 1 via ORAL
  Filled 2022-12-13: qty 1

## 2022-12-13 MED ORDER — FUROSEMIDE 20 MG PO TABS
20.0000 mg | ORAL_TABLET | Freq: Every day | ORAL | Status: DC
  Administered 2022-12-13 – 2022-12-14 (×2): 20 mg via ORAL
  Filled 2022-12-13 (×2): qty 1

## 2022-12-13 MED ORDER — PANTOPRAZOLE SODIUM 40 MG PO TBEC
40.0000 mg | DELAYED_RELEASE_TABLET | Freq: Two times a day (BID) | ORAL | Status: DC
  Administered 2022-12-13 – 2022-12-14 (×2): 40 mg via ORAL
  Filled 2022-12-13 (×2): qty 1

## 2022-12-13 MED ORDER — ONDANSETRON HCL 4 MG/2ML IJ SOLN
4.0000 mg | Freq: Once | INTRAMUSCULAR | Status: AC
  Administered 2022-12-13: 4 mg via INTRAVENOUS
  Filled 2022-12-13: qty 2

## 2022-12-13 MED ORDER — FENTANYL CITRATE PF 50 MCG/ML IJ SOSY
25.0000 ug | PREFILLED_SYRINGE | INTRAMUSCULAR | Status: DC | PRN
  Administered 2022-12-13 – 2022-12-14 (×3): 25 ug via INTRAVENOUS
  Filled 2022-12-13 (×3): qty 1

## 2022-12-13 MED ORDER — ROPINIROLE HCL 0.25 MG PO TABS
0.5000 mg | ORAL_TABLET | Freq: Every day | ORAL | Status: DC
  Filled 2022-12-13: qty 2

## 2022-12-13 NOTE — ED Notes (Signed)
Pt asleep.

## 2022-12-13 NOTE — Telephone Encounter (Signed)
Spoke to [employer] [phone]. Scheduled appt for June 19th @ 2:20 pm. Pt has been notified.

## 2022-12-13 NOTE — Telephone Encounter (Signed)
Call [facility] Patient has felt right breast mass approximately 3:00.  She has upcoming diagnostic mammogram images scheduled for the end of this month.  Can we schedule sooner?

## 2022-12-13 NOTE — ED Notes (Signed)
EDP at bedside  

## 2022-12-13 NOTE — ED Notes (Signed)
Provided juice and crackers.

## 2022-12-13 NOTE — Telephone Encounter (Signed)
I have signed Please ask [place] to schedule

## 2022-12-13 NOTE — ED Notes (Signed)
Pt resting comfortably in bed

## 2022-12-13 NOTE — Telephone Encounter (Signed)
  Per answering service message:  Patient had ablasion yesterday and is calling complaining of chest pain. Please call

## 2022-12-13 NOTE — Consult Note (Signed)
Cardiology Consult    Patient ID: Demetric Quick MRN: [id number], DOB/AGE: 12/24/1950   Admit date: 12/13/2022 Date of Consult: 12/13/2022  Primary Physician: Allegra Grana, FNP Primary Cardiologist: Julien Nordmann, MD Requesting Provider: Jamey Reas, MD  Patient Profile    Amber Barnes is a 72 y.o. female with a history of paroxysmal atrial fibrillation, hypertension, chronic heart failure with midrange ejection fraction, sleep apnea, COPD, GERD, fatty liver disease, neuropathy, stage II - III chronic kidney disease, morbid obesity, arthritis, hyperlipidemia, migraines, and breast cancer, who is being seen today for the evaluation of chest pain following A-fib ablation on June 3, at the request of Dr. Cyril Loosen.  Past Medical History   Past Medical History:  Diagnosis Date   Achilles tendinitis    Acute medial meniscal tear    Allergy    Anxiety    Atrial fibrillation (HCC)    a. new onset 07/2016; b. CHADS2VASc --> 4 (HTN, female, age, CHF)-->eliquis; c. 12/2022 s/p PVI.   Chemotherapy induced nausea and vomiting 05/18/2021   Chronic anticoagulation    Apixaban   Chronic heart failure with mid-range ejection fraction (HFmrEF) (HCC)    a. 10/2022 Echo: EF 45-50%, glob HK, mod reduced RV fxn, RVSP , mildly dil LA, mild MR, mild-mod TR. Asc Ao 39mm.   Chronic lower back pain    CKD (chronic kidney disease), stage II    Colon polyp    Complication of anesthesia    Emergence delirium; hypoxia   COPD (chronic obstructive pulmonary disease) (HCC)    Cystic breast    Depression    Endometriosis    Fibrocystic breast disease    GERD (gastroesophageal reflux disease)    History of stress test    a. 07/2016 MV: EF 55-65%. Small, mild defect  in mid anteroseptal, apical septal, and apical locations. No ischemia-->low risk; b. 10/2022 MV: mod sized partially rev apical ant, apical septal, and apical defect. EF 52%. No signific cor Ca2+; b. 11/2022 Cardiac CT: Ca2+ = 31.6  (58th%'ile).   Hyperlipidemia    Hypertension    Insomnia    Lipoma    Malignant neoplasm of upper-outer quadrant of right female breast (HCC)    Stage 1A invasive mammary carcinoma (cT1b or T1c N0); ER (-), PR (+), HER2/neu (+)   Migraine    Mild Mitral regurgitation    a. 07/2016 Echo: Mild MR; b. 10/2022 Echo: Mild MR.   Nephrolithiasis    OSA (obstructive sleep apnea)    non-compliant with nocturnal PAP therapy   Osteoarthritis    left knee   Osteopenia    Peptic ulcer disease    a. H. pylori   Pneumonia    RLS (restless legs syndrome)    Tendonitis    Thyroid disease    Tricuspid regurgitation    a. 10/2022 Echo: mild-mod TR.   Vitamin D deficiency     Past Surgical History:  Procedure Laterality Date   ABDOMINAL HYSTERECTOMY     ovaries left   ACHILLES TENDON SURGERY     2012,01/2017   ATRIAL FIBRILLATION ABLATION N/A 12/12/2022   Procedure: ATRIAL FIBRILLATION ABLATION;  Surgeon: Lanier Prude, MD;  Location: [facility];  Service: Cardiovascular;  Laterality: N/A;   BACK SURGERY     fusion lumbar   BREAST BIOPSY Right 01/15/2021   Affirm biopsy-"X" clip-INVASIVE MAMMARY CARCINOMA   BREAST BIOPSY Right 01/15/2021   u/s bx-"venus" clip INVASIVE MAMMARY CARCINOMA   BREAST BIOPSY  BREAST LUMPECTOMY,RADIO FREQ LOCALIZER,AXILLARY SENTINEL LYMPH NODE BIOPSY Right 02/17/2021   Procedure: BREAST LUMPECTOMY,RADIO FREQ LOCALIZER,AXILLARY SENTINEL LYMPH NODE BIOPSY;  Surgeon: Campbell Lerner, MD;  Location: [facility];  Service: General;  Laterality: Right;   CARDIOVERSION N/A 09/07/2016   Procedure: CARDIOVERSION;  Surgeon: Antonieta Iba, MD;  Location: [facility];  Service: Cardiovascular;  Laterality: N/A;   CARDIOVERSION N/A 10/19/2016   Procedure: CARDIOVERSION;  Surgeon: Antonieta Iba, MD;  Location: [facility];  Service: Cardiovascular;  Laterality: N/A;   CARDIOVERSION N/A 12/14/2016   Procedure: CARDIOVERSION;  Surgeon: Antonieta Iba, MD;  Location:  [facility];  Service: Cardiovascular;  Laterality: N/A;   COLONOSCOPY     COLONOSCOPY WITH PROPOFOL N/A 05/13/2020   Procedure: COLONOSCOPY WITH PROPOFOL;  Surgeon: Toledo, Boykin Nearing, MD;  Location: [facility];  Service: Gastroenterology;  Laterality: N/A;  ELIQUIS   CYSTOSCOPY WITH STENT PLACEMENT Bilateral 07/12/2021   Procedure: CYSTOSCOPY WITH STENT PLACEMENT;  Surgeon: Sondra Come, MD;  Location: [facility];  Service: Urology;  Laterality: Bilateral;   CYSTOSCOPY/URETEROSCOPY/HOLMIUM LASER/STENT PLACEMENT Bilateral 07/30/2021   Procedure: CYSTOSCOPY/URETEROSCOPY/HOLMIUM LASER/BILATERAL STENT REMOVAL;  Surgeon: Sondra Come, MD;  Location: [facility];  Service: Urology;  Laterality: Bilateral;   ESOPHAGOGASTRODUODENOSCOPY     ESOPHAGOGASTRODUODENOSCOPY (EGD) WITH PROPOFOL N/A 05/13/2020   Procedure: ESOPHAGOGASTRODUODENOSCOPY (EGD) WITH PROPOFOL;  Surgeon: Toledo, Boykin Nearing, MD;  Location: [facility];  Service: Gastroenterology;  Laterality: N/A;   FOOT SURGERY Left    tendon   JOINT REPLACEMENT Right    hip   PORTA CATH INSERTION N/A 03/23/2021   Procedure: PORTA CATH INSERTION;  Surgeon: Renford Dills, MD;  Location: [facility];  Service: Cardiovascular;  Laterality: N/A;   PORTACATH PLACEMENT N/A 03/23/2021   Procedure: ATTEMPTED INSERTION PORT-A-CATH;  Surgeon: Campbell Lerner, MD;  Location: [facility];  Service: General;  Laterality: N/A;   THYROIDECTOMY  1990   half thyroid lobectomy secondary to a benign nodule   TOTAL HIP ARTHROPLASTY Right 08/21/2018   Procedure: TOTAL HIP ARTHROPLASTY ANTERIOR APPROACH-RIGHT CELL SAVER REQUESTED;  Surgeon: Kennedy Bucker, MD;  Location: [facility];  Service: Orthopedics;  Laterality: Right;     Allergies  Allergies  Allergen Reactions   Prednisone Other (See Comments)    Agitation, hyperactivity, polyphagia   Amiodarone Nausea And Vomiting and Other (See Comments)   Hydrocodone-Acetaminophen Other (See Comments)     Hallucinations and combative   Oxycodone Itching    Can take it with benadryl   Tapentadol Itching   Tramadol Itching    Can take it with benadryl   Adhesive [Tape] Itching and Rash    Prefers paper tape   Latex Itching and Rash    use paper tap    History of Present Illness    72 y.o. female with a history of paroxysmal atrial fibrillation, hypertension, chronic heart failure with midrange ejection fraction, sleep apnea, COPD, GERD, fatty liver disease, neuropathy, stage II - III chronic kidney disease, morbid obesity, arthritis, hyperlipidemia, migraines, and breast cancer.  She was diagnosed with atrial fibrillation in 2018 and subsequently underwent cardioversion.  She had recurrent symptomatic atrial fibrillation and failed flecainide and did not tolerate amiodarone.  She was evaluated by electrophysiology in January 2024 with recommendation for catheter ablation, which was just performed yesterday (see below).  In the setting of chemotherapy for breast cancer treatment, she has had serial echocardiograms over the years.  Most recent echo in April 2024 showed EF of 45 to 50% and global  hypokinesis, which was reduction from prior echo.  At office visit in April, she reported fatigue, dyspnea, and intermittent chest discomfort in the setting of ongoing atrial fibrillation, and subsequently underwent stress testing which showed a moderate size partially reversible apical anterior, apical septal, and apical defect.  She was seen back in clinic by Dr. Mariah Milling on April 30.  She was felt to be volume depleted and HCTZ therapy was discontinued while Lasix was reduced to 20 mg daily.  As noted above, patient underwent catheter ablation for atrial fibrillation and typical atrial flutter on June 3.  Trivial pericardial effusion was noted on intracardiac echo postprocedure and she was placed on colchicine x 5 days as well as PPI therapy for 45 days.  She felt well throughout the evening yesterday however,  when Ms. Southwood awoke this AM she sat up in bed and had severe, 8/10 sharp chest pain.  This worsened w/ lying down and also w/ deep breathing.  At one point, the pain moved to her left jaw and ear.  She contacted our office to report symptoms w/ plan to have her add ibuprofen 600 mg BID to her colchicine, and have her f/u in afib clinic w/in the next 24 hrs.  When office staff reached back out to pt to pass on recs, she had already presented to [facility] ED for eval.  Here, ECG shows sinus rhythm w/o acute ST/T changes.  CXR shows cardiomegaly w/ vascular congestion.  Labs notable for troponin of 1791 and WBC of 11.3.  Currently pain is 8/10, though she is hemodynamically stable.  Pain worse w/ deep breathing on examination.  F/u trop 1506.  Inpatient Medications     colchicine  0.6 mg Oral BID   ibuprofen  600 mg Oral BID   pantoprazole  40 mg Oral Daily    Family History    Family History  Problem Relation Age of Onset   Cancer Mother        leukemia   Cancer Father 60       colon   Heart disease Father    Heart attack Father 65       died from MI   Hyperlipidemia Father    Hypertension Father    Cancer Sister 7       ovarian   Depression Sister    Cancer Brother        CLL   Breast cancer Paternal Aunt        40's   Migraines Neg Hx    Thyroid cancer Neg Hx    She indicated that her mother is deceased. She indicated that her father is deceased. She indicated that her sister is alive. She indicated that both of her brothers are alive. She indicated that the status of her paternal aunt is unknown. She indicated that the status of her neg hx is unknown.   Social History    Social History   Socioeconomic History   Marital status: Married    Spouse name: jeffrey   Number of children: 1   Years of education: Not on file   Highest education level: 12th grade  Occupational History    Comment: disabled  Tobacco Use   Smoking status: Former    Packs/day: 0.25    Years:  10.00    Additional pack years: 0.00    Total pack years: 2.50    Types: Cigarettes    Quit date: 2005    Years since quitting: 19.4  Smokeless tobacco: Never   Tobacco comments:    smoked for 10 years, 1 pack every 2-3 days  Vaping Use   Vaping Use: Never used  Substance and Sexual Activity   Alcohol use: Not Currently    Alcohol/week: 1.0 standard drink of alcohol    Types: 1 Glasses of wine per week    Comment: rarely   Drug use: No   Sexual activity: Not Currently  Other Topics Concern   Not on file  Social History Narrative   Moved to [place] from [place], originally from [place].    1 cup of caffeine daily   Right handed   Lives with husband, step-daughter and child       Social Determinants of Health   Financial Resource Strain: Low Risk  (11/09/2022)   Overall Financial Resource Strain (CARDIA)    Difficulty of Paying Living Expenses: Not very hard  Food Insecurity: No Food Insecurity (11/09/2022)   Hunger Vital Sign    Worried About Running Out of Food in the Last Year: Never true    Ran Out of Food in the Last Year: Never true  Transportation Needs: No Transportation Needs (11/09/2022)   PRAPARE - Administrator, Civil Service (Medical): No    Lack of Transportation (Non-Medical): No  Physical Activity: Unknown (11/09/2022)   Exercise Vital Sign    Days of Exercise per Week: 0 days    Minutes of Exercise per Session: Not on file  Recent Concern: Physical Activity - Inactive (11/09/2022)   Exercise Vital Sign    Days of Exercise per Week: 0 days    Minutes of Exercise per Session: 20 min  Stress: Stress Concern Present (11/09/2022)   [employer] of Occupational Health - Occupational Stress Questionnaire    Feeling of Stress : To some extent  Social Connections: Socially Integrated (11/09/2022)   Social Connection and Isolation Panel [NHANES]    Frequency of Communication with Friends and Family: Twice a week    Frequency of  Social Gatherings with Friends and Family: Twice a week    Attends Religious Services: More than 4 times per year    Active Member of [employer] or Organizations: Yes    Attends Banker Meetings: 1 to 4 times per year    Marital Status: Married  Catering manager Violence: Not At Risk (12/09/2021)   Humiliation, Afraid, Rape, and Kick questionnaire    Fear of Current or Ex-Partner: No    Emotionally Abused: No    Physically Abused: No    Sexually Abused: No     Review of Systems    General:  No chills, fever, night sweats or weight changes.  Cardiovascular:  +++ pleuritic chest pain, no dyspnea on exertion, edema, orthopnea, palpitations, paroxysmal nocturnal dyspnea. Dermatological: No rash, lesions/masses Respiratory: No cough, dyspnea Urologic: No hematuria, dysuria Abdominal:   No nausea, vomiting, diarrhea, bright red blood per rectum, melena, or hematemesis Neurologic:  No visual changes, wkns, changes in mental status. All other systems reviewed and are otherwise negative except as noted above.  Physical Exam    Blood pressure (!) 128/57, pulse 69, temperature 98.3 F (36.8 C), temperature source Oral, resp. rate (!) 21, height 5\' 5"  (1.651 m), weight 110.7 kg, SpO2 92 %.  General: Pleasant, NAD Psych: Normal affect. Neuro: Alert and oriented X 3. Moves all extremities spontaneously. HEENT: Normal  Neck: Supple without bruits or JVD. Lungs:  Resp regular and unlabored, CTA. Heart: RRR no  s3, s4, 2/6 syst murmur @ LLSB. Abdomen: Soft, non-tender, non-distended, BS + x 4.  Extremities: No clubbing, cyanosis or edema. DP/PT2+, Radials 2+ and equal bilaterally.  Labs    Cardiac Enzymes Recent Labs  Lab 12/13/22 1025 12/13/22 1141  TROPONINIHS 1,791* 1,506*     BNP    Component Value Date/Time   BNP 58.1 07/12/2021 0309   Lab Results  Component Value Date   WBC 11.3 (H) 12/13/2022   HGB 13.3 12/13/2022   HCT 40.6 12/13/2022   MCV 97.4 12/13/2022   PLT  149 (L) 12/13/2022    Recent Labs  Lab 12/13/22 1025  NA 138  K 3.7  CL 103  CO2 29  BUN 14  CREATININE 0.85  CALCIUM 8.5*  GLUCOSE 127*   Lab Results  Component Value Date   CHOL 192 03/11/2022   HDL 60.50 03/11/2022   LDLCALC 109 (H) 03/11/2022   TRIG 114.0 03/11/2022    Radiology Studies    DG Chest 2 View  Result Date: 12/13/2022 CLINICAL DATA:  Chest pain EXAM: CHEST - 2 VIEW COMPARISON:  X-ray 07/02/2021.  CT scan 03/03/2022 FINDINGS: Enlarged cardiopericardial silhouette with some vascular congestion. No pneumothorax, effusion or edema. Overlapping cardiac leads. Left upper chest IJ chest port with tip at the SVC right atrial junction region. Mild degenerative changes of the spine on lateral view. IMPRESSION: Enlarged heart with vascular congestion.  Chest port Electronically Signed   By: Karen Kays M.D.   On: 12/13/2022 11:08   ECG & Cardiac Imaging    RSR, 67, 1st deg AVB, poor R progression, no acute ST/T changes - personally reviewed.  Assessment & Plan    1.  Acute pericarditis: Patient with a history of persistent atrial fibrillation and flutter status post catheter ablation for both on June 3.  She was noted to have a trivial pericardial effusion on intracardiac echo during procedure and was placed on colchicine at discharge.  She felt well until awakening this morning with severe, sharp chest pain that initially radiated to her jaw and left ear but has since been pleuritic in nature and also worse with lying flat, prompting presentation to the ED today.  ECG is without acute ST or T changes.  Troponins are elevated at 1791  1506.  Stat limited echo just performed and showed an EF of 50 to 55% without regional wall motion abnormalities or evidence of effusion/tamponade.  Suspect troponin elevation secondary to ablative therapy, and downward trend is reassuring that this does not represent ACS.  Will plan to continue colchicine 0.6 mg twice daily, though recommend a  more prolonged course (originally prescribed for 5 days - EP wishes to extend to 14 days), potentially out to 3 months if necessary.  Further, we will add ibuprofen 600 mg twice daily at the recommendation of Dr. Lalla Brothers.  She is already on PPI therapy.  Will need outpatient monitoring of H&H in the setting of ibuprofen and Eliquis therapy.  2.  Elevated troponin: As above, troponin elevated with downward trend in the setting of recent catheter ablation.  Echo with low normal LV function and without regional wall motion abnormalities.  This does not appear to represent ACS and is more consistent with acute pericarditis as outlined above.  She did have a low to intermediate risk stress test in April of this year with recommendation for conservative management by her primary cardiologist.  Subsequent cardiac CT with calcium score of 31.6 (58th percentile).  Reevaluate symptoms following  management of pericarditis and defer to primary cardiologist as to whether or not additional ischemic evaluation is warranted.  Continue home medications.  3.  Persistent atrial fibrillation/flutter: As above, status post catheter ablation yesterday.  Maintaining sinus rhythm.  Continue home medications including flecainide, bisoprolol, and eliquis.  4. Chronic HFmrEF:  EF 45-50% on echo in April w/ limited echo showing sl improvement in sinus rhythm @ 50-55%.  Does not appear to be volume overloaded on examination.  Cont home meds.  Signed, Nicolasa Ducking, NP 12/13/2022, 2:05 PM  For questions or updates, please contact   Please consult [email or web address] for contact info under Cardiology/STEMI.

## 2022-12-13 NOTE — Assessment & Plan Note (Addendum)
Chest pain with rest.  Patient currently at home with her daughter and husband.  Patient had an ablation for atrial fibrillation yesterday, Dr. Lalla Brothers.  There is a call to his office however advised patient to go to the emergency room to be evaluated to ensure this is postoperative pain versus cardiac in nature.  She declined going by EMS.  She states her husband will drive her . she verbalized understanding. Triage report given to [facility] ED nurse Morrie Sheldon.

## 2022-12-13 NOTE — Telephone Encounter (Signed)
Pt contacted [facility] Triage with an urgent s/p 6/3, AFIB Ablation concern.    Pt stated that she felt fine yesterday; When she woke up this morning had chest pain / discomfort  ( 8 out of 10 ) with breathing in / out, and with movement.  Device Team consulted, and Dr. Lalla Brothers contacted.    Per Dr. Lalla Brothers, Have Pt take Ibuprofen 600 mg, BID with food, and take her colchicine 0.6 mg BID with food to help with symptom management.   Dr. Lalla Brothers also wanted Pt seen in AFIB clinic today or tomorrow at the latest.  Per Ms. Stacy RN of AFIB clinic, they had a cancellation for 330 pm today, 12/13/22;  I will call Pt and advise regarding orders above, and appointment this afternoon.  Follow up required.    ===============================   Attempted to contact Pt x 5 calls, and unable to contact or leave a voicemail message, regarding Dr. Lovena Neighbours plan of care above / AFIB clinic appointment obtained for 6/4 at 330 pm.   Saw in EPIC that Pt checked into [facility] for providers care.     Message sent to Ms. Stacy RN of Afib Clinic in [facility], and cancelled 330 pm appointment there.    I will advise Dr. Jeanie Cooks of Pt checking into ER for care, and that Afib Clinic appointment cancelled.  Device Team [facility] on [address] made aware of Pt ER visit as well.

## 2022-12-13 NOTE — H&P (Signed)
History and Physical    Kalia Andreen ZOX:[id number] DOB: 09/11/1950 DOA: 12/13/2022  Referring MD/NP/PA:   PCP: Allegra Grana, FNP   Patient coming from:  The patient is coming from home.     Chief Complaint: chest pain  HPI: Amber Barnes is a 72 y.o. female with medical history significant of atrial fibrillation on Eliquis, s/p of ablation yesterday, hypertension, hyperlipidemia, COPD, diastolic CHF, depression with anxiety, breast cancer (s/p of chemotherapy), morbid obesity, who presents with chest pain.  Patient had history of persistent atrial fibrillation and flutter. She underwent procedure of catheter ablation for both yesterday 6/3. She was noted to have a trivial pericardial effusion on intracardiac echo during procedure. She was placed on colchicine at discharge. She states that she started having chest pain since last night, which is located in the substernal area, sharp, 8 out of 10 severity, radiating to left jaw, pleuritic, aggravated by deep breath and laying flat.  Patient does not have shortness of breath, cough, fever or chills.  No nausea, vomiting, diarrhea or abdominal pain.  No symptoms of UTI.  Data reviewed independently and ED Course: pt was found to have troponin 1791 --> 1506, BNP 76.9, WBC 11.3, GFR> 60, temperature normal, blood pressure 128/57, heart rate 69, RR 21, oxygen saturation 92-98% on room air.  Chest x-ray showed cardiomegaly and vascular congestion.  Patient is placed on telemetry bed for observation.  Dr. Okey Dupre of cardiology is consulted.   EKG: I have personally reviewed.  Sinus rhythm, QTc 494, borderline LAD, poor R wave progression, T wave inversion in V1-V5, PAC.  Review of Systems:   General: no fevers, chills, no body weight gain, has fatigue HEENT: no blurry vision, hearing changes or sore throat Respiratory: no dyspnea, coughing, wheezing CV: has chest pain, no palpitations GI: no nausea, vomiting, abdominal pain, diarrhea,  constipation GU: no dysuria, burning on urination, increased urinary frequency, hematuria  Ext: no leg edema Neuro: no unilateral weakness, numbness, or tingling, no vision change or hearing loss Skin: no rash, no skin tear. MSK: No muscle spasm, no deformity, no limitation of range of movement in spin Heme: No easy bruising.  Travel history: No recent long distant travel.   Allergy:  Allergies  Allergen Reactions   Prednisone Other (See Comments)    Agitation, hyperactivity, polyphagia   Amiodarone Nausea And Vomiting and Other (See Comments)   Hydrocodone-Acetaminophen Other (See Comments)    Hallucinations and combative   Oxycodone Itching    Can take it with benadryl   Tapentadol Itching   Tramadol Itching    Can take it with benadryl   Adhesive [Tape] Itching and Rash    Prefers paper tape   Latex Itching and Rash    use paper tap    Past Medical History:  Diagnosis Date   Achilles tendinitis    Acute medial meniscal tear    Allergy    Anxiety    Atrial fibrillation (HCC)    a. new onset 07/2016; b. CHADS2VASc --> 4 (HTN, female, age, CHF)-->eliquis; c. 12/2022 s/p PVI.   Chemotherapy induced nausea and vomiting 05/18/2021   Chronic anticoagulation    Apixaban   Chronic heart failure with mid-range ejection fraction (HFmrEF) (HCC)    a. 10/2022 Echo: EF 45-50%, glob HK, mod reduced RV fxn, RVSP , mildly dil LA, mild MR, mild-mod TR. Asc Ao 39mm.   Chronic lower back pain    CKD (chronic kidney disease), stage II    Colon polyp  Complication of anesthesia    Emergence delirium; hypoxia   COPD (chronic obstructive pulmonary disease) (HCC)    Cystic breast    Depression    Endometriosis    Fibrocystic breast disease    GERD (gastroesophageal reflux disease)    History of stress test    a. 07/2016 MV: EF 55-65%. Small, mild defect  in mid anteroseptal, apical septal, and apical locations. No ischemia-->low risk; b. 10/2022 MV: mod sized partially rev apical  ant, apical septal, and apical defect. EF 52%. No signific cor Ca2+; b. 11/2022 Cardiac CT: Ca2+ = 31.6 (58th%'ile).   Hyperlipidemia    Hypertension    Insomnia    Lipoma    Malignant neoplasm of upper-outer quadrant of right female breast (HCC)    Stage 1A invasive mammary carcinoma (cT1b or T1c N0); ER (-), PR (+), HER2/neu (+)   Migraine    Mild Mitral regurgitation    a. 07/2016 Echo: Mild MR; b. 10/2022 Echo: Mild MR.   Nephrolithiasis    OSA (obstructive sleep apnea)    non-compliant with nocturnal PAP therapy   Osteoarthritis    left knee   Osteopenia    Peptic ulcer disease    a. H. pylori   Pneumonia    RLS (restless legs syndrome)    Tendonitis    Thyroid disease    Tricuspid regurgitation    a. 10/2022 Echo: mild-mod TR.   Vitamin D deficiency     Past Surgical History:  Procedure Laterality Date   ABDOMINAL HYSTERECTOMY     ovaries left   ACHILLES TENDON SURGERY     2012,01/2017   ATRIAL FIBRILLATION ABLATION N/A 12/12/2022   Procedure: ATRIAL FIBRILLATION ABLATION;  Surgeon: Lanier Prude, MD;  Location: [facility];  Service: Cardiovascular;  Laterality: N/A;   BACK SURGERY     fusion lumbar   BREAST BIOPSY Right 01/15/2021   Affirm biopsy-"X" clip-INVASIVE MAMMARY CARCINOMA   BREAST BIOPSY Right 01/15/2021   u/s bx-"venus" clip INVASIVE MAMMARY CARCINOMA   BREAST BIOPSY     BREAST LUMPECTOMY,RADIO FREQ LOCALIZER,AXILLARY SENTINEL LYMPH NODE BIOPSY Right 02/17/2021   Procedure: BREAST LUMPECTOMY,RADIO FREQ LOCALIZER,AXILLARY SENTINEL LYMPH NODE BIOPSY;  Surgeon: Campbell Lerner, MD;  Location: [facility];  Service: General;  Laterality: Right;   CARDIOVERSION N/A 09/07/2016   Procedure: CARDIOVERSION;  Surgeon: Antonieta Iba, MD;  Location: [facility];  Service: Cardiovascular;  Laterality: N/A;   CARDIOVERSION N/A 10/19/2016   Procedure: CARDIOVERSION;  Surgeon: Antonieta Iba, MD;  Location: [facility];  Service: Cardiovascular;  Laterality: N/A;    CARDIOVERSION N/A 12/14/2016   Procedure: CARDIOVERSION;  Surgeon: Antonieta Iba, MD;  Location: [facility];  Service: Cardiovascular;  Laterality: N/A;   COLONOSCOPY     COLONOSCOPY WITH PROPOFOL N/A 05/13/2020   Procedure: COLONOSCOPY WITH PROPOFOL;  Surgeon: Toledo, Boykin Nearing, MD;  Location: [facility];  Service: Gastroenterology;  Laterality: N/A;  ELIQUIS   CYSTOSCOPY WITH STENT PLACEMENT Bilateral 07/12/2021   Procedure: CYSTOSCOPY WITH STENT PLACEMENT;  Surgeon: Sondra Come, MD;  Location: [facility];  Service: Urology;  Laterality: Bilateral;   CYSTOSCOPY/URETEROSCOPY/HOLMIUM LASER/STENT PLACEMENT Bilateral 07/30/2021   Procedure: CYSTOSCOPY/URETEROSCOPY/HOLMIUM LASER/BILATERAL STENT REMOVAL;  Surgeon: Sondra Come, MD;  Location: [facility];  Service: Urology;  Laterality: Bilateral;   ESOPHAGOGASTRODUODENOSCOPY     ESOPHAGOGASTRODUODENOSCOPY (EGD) WITH PROPOFOL N/A 05/13/2020   Procedure: ESOPHAGOGASTRODUODENOSCOPY (EGD) WITH PROPOFOL;  Surgeon: Toledo, Boykin Nearing, MD;  Location: [facility];  Service: Gastroenterology;  Laterality: N/A;  FOOT SURGERY Left    tendon   JOINT REPLACEMENT Right    hip   PORTA CATH INSERTION N/A 03/23/2021   Procedure: PORTA CATH INSERTION;  Surgeon: Renford Dills, MD;  Location: [facility];  Service: Cardiovascular;  Laterality: N/A;   PORTACATH PLACEMENT N/A 03/23/2021   Procedure: ATTEMPTED INSERTION PORT-A-CATH;  Surgeon: Campbell Lerner, MD;  Location: [facility];  Service: General;  Laterality: N/A;   THYROIDECTOMY  1990   half thyroid lobectomy secondary to a benign nodule   TOTAL HIP ARTHROPLASTY Right 08/21/2018   Procedure: TOTAL HIP ARTHROPLASTY ANTERIOR APPROACH-RIGHT CELL SAVER REQUESTED;  Surgeon: Kennedy Bucker, MD;  Location: [facility];  Service: Orthopedics;  Laterality: Right;    Social History:  reports that she quit smoking about 19 years ago. Her smoking use included cigarettes. She has a 2.50  pack-year smoking history. She has never used smokeless tobacco. She reports that she does not currently use alcohol after a past usage of about 1.0 standard drink of alcohol per week. She reports that she does not use drugs.  Family History:  Family History  Problem Relation Age of Onset   Cancer Mother        leukemia   Cancer Father 10       colon   Heart disease Father    Heart attack Father 33       died from MI   Hyperlipidemia Father    Hypertension Father    Cancer Sister 40       ovarian   Depression Sister    Cancer Brother        CLL   Breast cancer Paternal Aunt        40's   Migraines Neg Hx    Thyroid cancer Neg Hx      Prior to Admission medications   Medication Sig Start Date End Date Taking? Authorizing Provider  acetaminophen (TYLENOL) 500 MG tablet Take 500-1,000 mg by mouth every 6 (six) hours as needed for mild pain or moderate pain.   Yes [provider]  ALPRAZolam (XANAX) 0.25 MG tablet TAKE 1 TABLET BY MOUTH 2 TIMES DAILY AS NEEDED FOR ANXIETY. 11/16/22  Yes Arnett, Lyn Records, FNP  bisoprolol (ZEBETA) 10 MG tablet Take 1.5 tablets (15 mg total) by mouth 2 (two) times daily. 11/08/22  Yes Antonieta Iba, MD  Cholecalciferol (VITAMIN D3) 5000 units CAPS Take 5,000 Units by mouth daily.   Yes [provider]  colchicine 0.6 MG tablet Take 1 tablet (0.6 mg total) by mouth 2 (two) times daily for 5 days. 12/12/22 12/17/22 Yes Tillery, Mariam Dollar, PA-C  ELIQUIS 5 MG TABS tablet TAKE 1 TABLET BY MOUTH TWICE A DAY 09/12/22  Yes Gollan, Tollie Pizza, MD  escitalopram (LEXAPRO) 10 MG tablet Take 1 tablet (10 mg total) by mouth daily. 09/14/22  Yes Allegra Grana, FNP  flecainide (TAMBOCOR) 100 MG tablet Take 1 tablet (100 mg total) by mouth 2 (two) times daily. 05/13/22  Yes Gollan, Tollie Pizza, MD  FLUoxetine (PROZAC) 20 MG capsule TAKE 1 CAPSULE BY MOUTH EVERY DAY IN THE MORNING 11/30/22  Yes Arnett, Lyn Records, FNP  furosemide (LASIX) 20 MG tablet  Take 1 tablet (20 mg total) by mouth daily. 11/08/22  Yes Gollan, Tollie Pizza, MD  nystatin cream (MYCOSTATIN) Apply 1 application. topically 2 (two) times daily. Prn abdomen 10/28/21  Yes McLean-Scocuzza, Pasty Spillers, MD  ondansetron (ZOFRAN) 8 MG tablet Take 1 tablet (8 mg total)  by mouth every 8 (eight) hours as needed. 11/08/22  Yes Gollan, Tollie Pizza, MD  pantoprazole (PROTONIX) 40 MG tablet Take 1 tablet (40 mg total) by mouth daily. 12/12/22 01/26/23 Yes Tillery, Mariam Dollar, PA-C  potassium chloride SA (KLOR-CON M) 20 MEQ tablet Take 1 tablet (20 mEq total) by mouth daily. 11/08/22  Yes Gollan, Tollie Pizza, MD  ranolazine (RANEXA) 500 MG 12 hr tablet TAKE 1 TABLET BY MOUTH TWICE A DAY 07/12/22  Yes Gollan, Tollie Pizza, MD  rOPINIRole (REQUIP) 0.5 MG tablet Take 1 tablet (0.5 mg total) by mouth at bedtime. 11/09/22  Yes Allegra Grana, FNP  rOPINIRole (REQUIP) 2 MG tablet TAKE 1 TABLET BY MOUTH EVERYDAY AT BEDTIME 11/23/22  Yes Arnett, Lyn Records, FNP  rosuvastatin (CRESTOR) 40 MG tablet Take 1 tablet (40 mg total) by mouth daily. 03/18/22  Yes Arnett, Lyn Records, FNP  busPIRone (BUSPAR) 5 MG tablet TAKE 1 TABLET BY MOUTH THREE TIMES A DAY AS NEEDED 10/21/22   Allegra Grana, FNP  clotrimazole (LOTRIMIN) 1 % cream Apply 1 application topically 2 (two) times daily. 08/03/21   Eulis Foster, FNP  diphenhydrAMINE (BENADRYL) 25 mg capsule Take 1 capsule (25 mg total) by mouth every 8 (eight) hours as needed for itching or allergies. Patient not taking: Reported on 12/13/2022 07/17/21   Darlin Priestly, MD  meclizine (ANTIVERT) 12.5 MG tablet Take 1 tablet (12.5 mg total) by mouth 3 (three) times daily as needed for dizziness. Patient not taking: Reported on 12/13/2022 12/10/21   Allegra Grana, FNP  Multiple Vitamin (MULTIVITAMIN) tablet Take 1 tablet by mouth daily.    [provider]  promethazine (PHENERGAN) 25 MG tablet Take 1 tablet (25 mg total) by mouth every 6 (six) hours as needed for nausea or  vomiting. 04/02/21   [name], MD  rizatriptan (MAXALT) 10 MG tablet TAKE 1 TABLET (10 MG TOTAL) BY MOUTH AS NEEDED FOR MIGRAINE. MAY REPEAT IN 2 HOURS IF NEEDED 03/13/18   Anson Fret, MD  prochlorperazine (COMPAZINE) 10 MG tablet Take 1 tablet (10 mg total) by mouth every 6 (six) hours as needed (Nausea or vomiting). 03/19/21 03/02/22  [name], MD    Physical Exam: Vitals:   12/13/22 1535 12/13/22 1700 12/13/22 1715 12/13/22 1815  BP:   (!) 90/52   Pulse:  65 62 61  Resp:  16 15 13   Temp: 98.2 F (36.8 C)     TempSrc: Oral     SpO2:  95% 92% 94%  Weight:      Height:       General: Not in acute distress HEENT:       Eyes: PERRL, EOMI, no jaundice       ENT: No discharge from the ears and nose, no pharynx injection, no tonsillar enlargement.        Neck: No JVD, no bruit, no mass felt. Heme: No neck lymph node enlargement. Cardiac: S1/S2, RRR, No gallops or rubs. Respiratory: No rales, wheezing, rhonchi or rubs. GI: Soft, nondistended, nontender, no rebound pain, no organomegaly, BS present. GU: No hematuria Ext: No pitting leg edema bilaterally. 1+DP/PT pulse bilaterally. Musculoskeletal: No joint deformities, No joint redness or warmth, no limitation of ROM in spin. Skin: No rashes.  Neuro: Alert, oriented X3, cranial nerves II-XII grossly intact, moves all extremities normally.  Psych: Patient is not psychotic, no suicidal or hemocidal ideation.  Labs on Admission: I have personally reviewed following labs and imaging studies  CBC: Recent  Labs  Lab 12/12/22 1117 12/13/22 1025  WBC 5.1 11.3*  HGB 13.5 13.3  HCT 39.6 40.6  MCV 93.4 97.4  PLT 159 149*   Basic Metabolic Panel: Recent Labs  Lab 12/13/22 1025  NA 138  K 3.7  CL 103  CO2 29  GLUCOSE 127*  BUN 14  CREATININE 0.85  CALCIUM 8.5*   GFR: Estimated Creatinine Clearance: 76.3 mL/min (by C-G formula based on SCr of 0.85 mg/dL). Liver Function Tests: No results for input(s): "AST", "ALT", "ALKPHOS",  "BILITOT", "PROT", "ALBUMIN" in the last 168 hours. No results for input(s): "LIPASE", "AMYLASE" in the last 168 hours. No results for input(s): "AMMONIA" in the last 168 hours. Coagulation Profile: Recent Labs  Lab 12/13/22 1141  INR 1.2   Cardiac Enzymes: No results for input(s): "CKTOTAL", "CKMB", "CKMBINDEX", "TROPONINI" in the last 168 hours. BNP (last 3 results) No results for input(s): "PROBNP" in the last 8760 hours. HbA1C: No results for input(s): "HGBA1C" in the last 72 hours. CBG: No results for input(s): "GLUCAP" in the last 168 hours. Lipid Profile: No results for input(s): "CHOL", "HDL", "LDLCALC", "TRIG", "CHOLHDL", "LDLDIRECT" in the last 72 hours. Thyroid Function Tests: No results for input(s): "TSH", "T4TOTAL", "FREET4", "T3FREE", "THYROIDAB" in the last 72 hours. Anemia Panel: No results for input(s): "VITAMINB12", "FOLATE", "FERRITIN", "TIBC", "IRON", "RETICCTPCT" in the last 72 hours. Urine analysis:    Component Value Date/Time   COLORURINE AMBER (A) 07/12/2021 0848   APPEARANCEUR CLOUDY (A) 07/12/2021 0848   APPEARANCEUR Clear 10/10/2014 2137   LABSPEC 1.035 (H) 07/12/2021 0848   LABSPEC 1.006 10/10/2014 2137   PHURINE 7.0 07/12/2021 0848   GLUCOSEU 50 (A) 07/12/2021 0848   GLUCOSEU Negative 10/10/2014 2137   HGBUR LARGE (A) 07/12/2021 0848   BILIRUBINUR NEGATIVE 07/12/2021 0848   BILIRUBINUR neg 05/17/2016 1408   BILIRUBINUR Negative 10/10/2014 2137   KETONESUR NEGATIVE 07/12/2021 0848   PROTEINUR 100 (A) 07/12/2021 0848   UROBILINOGEN 0.2 05/17/2016 1408   NITRITE NEGATIVE 07/12/2021 0848   LEUKOCYTESUR TRACE (A) 07/12/2021 0848   LEUKOCYTESUR Negative 10/10/2014 2137   Sepsis Labs: @LABRCNTIP (procalcitonin:4,lacticidven:4) )No results found for this or any previous visit (from the past 240 hour(s)).   Radiological Exams on Admission: ECHOCARDIOGRAM LIMITED  Result Date: 12/13/2022    ECHOCARDIOGRAM LIMITED REPORT   Patient Name:    JOYCELYNN PHU Date of Exam: 12/13/2022 Medical Rec #:  [id number]        Height:       65.0 in Accession #:    [id number]       Weight:       244.0 lb Date of Birth:  03/21/1951        BSA:          2.153 m Patient Age:    72 years         BP:           156/87 mmHg Patient Gender: F                HR:           66 bpm. Exam Location:  [facility] Procedure: Limited Echo Indications:     Chest pain, Pericardial effusion  History:         Patient has prior history of Echocardiogram examinations, most                  recent 10/20/2022. CHF, COPD, Arrythmias:Atrial Fibrillation and  Bradycardia, Signs/Symptoms:Chest Pain; Risk Factors:Sleep                  Apnea and Dyslipidemia. Breast CA, S/P a-fib ablation 12/12/22.  Sonographer:     Mikki Harbor Referring Phys:  [id number] CHRISTOPHER END Diagnosing Phys: Yvonne Kendall MD  Sonographer Comments: Patient is obese. IMPRESSIONS  1. Left ventricular ejection fraction, by estimation, is 50 to 55%. The left ventricle has low normal function. The left ventricle has no regional wall motion abnormalities. There is mild left ventricular hypertrophy.  2. Moderate mitral annular calcification.  3. The inferior vena cava is dilated in size with <50% respiratory variability, suggesting right atrial pressure of 15 mmHg. FINDINGS  Left Ventricle: Left ventricular ejection fraction, by estimation, is 50 to 55%. The left ventricle has low normal function. The left ventricle has no regional wall motion abnormalities. The left ventricular internal cavity size was normal in size. There is mild left ventricular hypertrophy. Right Ventricle: No increase in right ventricular wall thickness. Pericardium: There is no evidence of pericardial effusion. Mitral Valve: Moderate mitral annular calcification. Tricuspid Valve: The tricuspid valve is grossly normal. Aorta: The aortic root is normal in size and structure. Venous: The inferior vena cava is dilated in size with less than 50%  respiratory variability, suggesting right atrial pressure of 15 mmHg. LEFT VENTRICLE PLAX 2D LVIDd:         4.90 cm LVIDs:         3.60 cm LV PW:         1.30 cm LV IVS:        1.20 cm LVOT diam:     2.10 cm LVOT Area:     3.46 cm  LV Volumes (MOD) LV vol d, MOD A2C: 59.4 ml LV vol d, MOD A4C: 71.2 ml LV vol s, MOD A2C: 34.6 ml LV vol s, MOD A4C: 26.2 ml LV SV MOD A2C:     24.8 ml LV SV MOD A4C:     71.2 ml LV SV MOD BP:      35.8 ml LEFT ATRIUM         Index LA diam:    3.90 cm 1.81 cm/m   AORTA Ao Root diam: 3.50 cm  SHUNTS Systemic Diam: 2.10 cm Yvonne Kendall MD Electronically signed by Yvonne Kendall MD Signature Date/Time: 12/13/2022/1:53:34 PM    Final    DG Chest 2 View  Result Date: 12/13/2022 CLINICAL DATA:  Chest pain EXAM: CHEST - 2 VIEW COMPARISON:  X-ray 07/02/2021.  CT scan 03/03/2022 FINDINGS: Enlarged cardiopericardial silhouette with some vascular congestion. No pneumothorax, effusion or edema. Overlapping cardiac leads. Left upper chest IJ chest port with tip at the SVC right atrial junction region. Mild degenerative changes of the spine on lateral view. IMPRESSION: Enlarged heart with vascular congestion.  Chest port Electronically Signed   By: Karen Kays M.D.   On: 12/13/2022 11:08   EP STUDY  Result Date: 12/12/2022 CONCLUSIONS: 1.  Successful PVI 2.  Successful ablation/isolation of the posterior wall 3.  Successful ablation of the cavotricuspid isthmus for typical atrial flutter 4.  Intracardiac echo reveals trivial pericardial effusion, normal left atrial architecture 5.  No early apparent complications. 6.  Colchicine 0.6 mg by mouth twice daily for 5 days 7.  Protonix 40 mg by mouth once daily for 45 days      Assessment/Plan Principal Problem:   Pericarditis Active Problems:   Myocardial injury   PAF (paroxysmal atrial fibrillation) (HCC)  HTN (hypertension)   HLD (hyperlipidemia)   COPD (chronic obstructive pulmonary disease) (HCC)   Chronic diastolic CHF  (congestive heart failure) (HCC)   GERD (gastroesophageal reflux disease)   Peptic ulcer   Anxiety and depression   Obesity, Class III, BMI 40-49.9 (morbid obesity) (HCC)   Assessment and Plan:  Pericarditis: pt's chest pain is most likely due to pericarditis per cardiologist. Consulted Dr. End of card. Per dr. Serita Kyle note, Dr. Lalla Brothers who did ablation procedure has recommended continuation of colchicine as well as addition of ibuprofen 600 mg twice daily with meals.   -Placed on TeleMed follow-up patient -As needed fentanyl for pain -Colchicine 0.6 mg twice daily -Ibuprofen 600 mg twice daily -Will start Protonix 40 mg twice daily (patient has a history of peptic ulcer)  Myocardial injury: Troponin level 1791, 1506. Per Dr. Okey Dupre, this does not appear to represent ACS. -Will not give aspirin since patient is on Eliquis and is on high-dose of ibuprofen -Trend troponin -Check A1c, FLP -As needed fentanyl for pain -Crestor  PAF (paroxysmal atrial fibrillation) (HCC) -Eliquis, flecainide, Zebeta  HTN (hypertension) -IV hydralazine as needed -Patient is on Lasix, zebeta  HLD (hyperlipidemia) -Crestor  COPD (chronic obstructive pulmonary disease) (HCC): Stable -Bronchodilators  Chronic diastolic CHF (congestive heart failure) (HCC): 2D echo on 12/13/2022 showed EF of 50-55%.  Patient does not have leg edema JVD.  BNP is normal 76.9, CHF is compensated. -Continue home Lasix  GERD (gastroesophageal reflux disease) and Peptic ulcer -Increase to Protonix to 40 mg twice daily  Anxiety and depression -Continue home medications  Obesity, Class III, BMI 40-49.9 (morbid obesity) (HCC): Body weight 110.7 kg, BMI 40.60 -Healthy diet and exercise -Encouraged losing weight     DVT ppx: on Eliquis  Code Status: Full code    Family Communication: I offered to call her family, but patient states that she does not need me to call her family.  She will call her family by  herself.  Disposition Plan:  Anticipate discharge back to previous environment  Consults called: Dr. Okey Dupre of cardiology  Admission status and Level of care: Telemetry Cardiac:    for obs    Dispo: The patient is from: Home              Anticipated d/c is to: Home              Anticipated d/c date is: 1 day              Patient currently is not medically stable to d/c.    Severity of Illness:  The appropriate patient status for this patient is OBSERVATION. Observation status is judged to be reasonable and necessary in order to provide the required intensity of service to ensure the patient's safety. The patient's presenting symptoms, physical exam findings, and initial radiographic and laboratory data in the context of their medical condition is felt to place them at decreased risk for further clinical deterioration. Furthermore, it is anticipated that the patient will be medically stable for discharge from the hospital within 2 midnights of admission.        Date of Service 12/13/2022    Lorretta Harp [facility]   If 7PM-7AM, please contact night-coverage [email or web address] 12/13/2022, 6:37 PM

## 2022-12-13 NOTE — ED Triage Notes (Signed)
Pt via POV from home. Pt was c/o mid chest pain that radiated up to the L jaw. Pt had a cardiac ablation done yesterday for afib/aflutter. Pt also is c/o SOB. Denies cough/congestion. Pt does take Eliquis. Pt is A&OX4 and NAD

## 2022-12-13 NOTE — Progress Notes (Signed)
*  PRELIMINARY RESULTS* Echocardiogram  A Limited 2D Echocardiogram has been performed.  Carolyne Fiscal 12/13/2022, 1:00 PM

## 2022-12-13 NOTE — ED Provider Notes (Signed)
[facility] Provider Note    Event Date/Time   First MD Initiated Contact with Patient 12/13/22 1013     (approximate)   History   Chest Pain   HPI  Amber Barnes is a 72 y.o. female with a history of paroxysmal atrial fibrillation, COPD presents with complaints of chest discomfort.  Patient reports she had ablation for atrial fibrillation yesterday.  Confirmed via review of records, patient had successful ablation she reports that she felt well yesterday, at around 3 in the morning she went to sit up a little bit and had discomfort in her chest which radiated into her neck.  She reports feeling somewhat better now     Physical Exam   Triage Vital Signs: ED Triage Vitals  Enc Vitals Group     BP 12/13/22 1010 (!) 156/81     Pulse Rate 12/13/22 1010 68     Resp 12/13/22 1010 18     Temp 12/13/22 1010 98.3 F (36.8 C)     Temp Source 12/13/22 1010 Oral     SpO2 12/13/22 1010 98 %     Weight 12/13/22 1009 110.7 kg (244 lb)     Height 12/13/22 1009 1.651 m (5\' 5" )     Head Circumference --      Peak Flow --      Pain Score 12/13/22 1009 8     Pain Loc --      Pain Edu? --      Excl. in GC? --     Most recent vital signs: Vitals:   12/13/22 1300 12/13/22 1330  BP: 134/81 (!) 128/57  Pulse: 69 69  Resp: 20 (!) 21  Temp:    SpO2: 96% 92%     General: Awake, no distress.  CV:  Good peripheral perfusion.  Resp:  Normal effort.  Abd:  No distention.  Other:     ED Results / Procedures / Treatments   Labs (all labs ordered are listed, but only abnormal results are displayed) Labs Reviewed  BASIC METABOLIC PANEL - Abnormal; Notable for the following components:      Result Value   Glucose, Bld 127 (*)    Calcium 8.5 (*)    All other components within normal limits  CBC - Abnormal; Notable for the following components:   WBC 11.3 (*)    Platelets 149 (*)    All other components within normal limits  HEPARIN LEVEL  (UNFRACTIONATED) - Abnormal; Notable for the following components:   Heparin Unfractionated >1.10 (*)    All other components within normal limits  TROPONIN I (HIGH SENSITIVITY) - Abnormal; Notable for the following components:   Troponin I (High Sensitivity) 1,791 (*)    All other components within normal limits  TROPONIN I (HIGH SENSITIVITY) - Abnormal; Notable for the following components:   Troponin I (High Sensitivity) 1,506 (*)    All other components within normal limits  APTT  PROTIME-INR     EKG  ED ECG REPORT I, Jene Every, the attending physician, personally viewed and interpreted this ECG.  Date: 12/13/2022  Rhythm: normal sinus rhythm QRS Axis: normal Intervals: normal ST/T Wave abnormalities: normal Narrative Interpretation: no evidence of acute ischemia    RADIOLOGY Chest x-ray viewed interpreted by me, no acute normality    PROCEDURES:  Critical Care performed: yes  CRITICAL CARE Performed by: Jene Every   Total critical care time: 30 minutes  Critical care time was exclusive of separately billable procedures  and treating other patients.  Critical care was necessary to treat or prevent imminent or life-threatening deterioration.  Critical care was time spent personally by me on the following activities: development of treatment plan with patient and/or surrogate as well as nursing, discussions with consultants, evaluation of patient's response to treatment, examination of patient, obtaining history from patient or surrogate, ordering and performing treatments and interventions, ordering and review of laboratory studies, ordering and review of radiographic studies, pulse oximetry and re-evaluation of patient's condition. }  Procedures   MEDICATIONS ORDERED IN ED: Medications  colchicine tablet 0.6 mg (0.6 mg Oral Given 12/13/22 1301)  pantoprazole (PROTONIX) EC tablet 40 mg (40 mg Oral Given 12/13/22 1301)  ibuprofen (ADVIL) tablet 600 mg  (has no administration in time range)  morphine (PF) 4 MG/ML injection 4 mg (has no administration in time range)  ondansetron (ZOFRAN) injection 4 mg (has no administration in time range)     IMPRESSION / MDM / ASSESSMENT AND PLAN / ED COURSE  I reviewed the triage vital signs and the nursing notes. Patient's presentation is most consistent with acute presentation with potential threat to life or bodily function.  Patient presents with chest pain as detailed above, differential includes ACS, possibly procedural related discomfort, pneumonia, less likely pneumothorax   Will check labs including high-sensitivity troponin, obtain chest x-ray placed the cardiac monitor and reevaluate  ----------------------------------------- 11:27 AM on 12/13/2022 ----------------------------------------- Notified of significantly elevated troponin of 1791, have discussed with Dr. Okey Dupre of cardiology who initially recommended heparin but after discussion with Dr. Lalla Brothers the ablation was complex and he is not surprised about elevated troponin, recommend second troponin echocardiogram and reevaluation  Second troponin is trending down ----------------------------------------- 12:42 PM on 12/13/2022 -----------------------------------------  Echo is currently in process   Dr. Okey Dupre reports echocardiogram looks okay, patient continues to have 8 out of 10 chest discomfort and given this he and I agree that patient should stay in the hospital for pain control and observation  I have ordered additional pain medications for her, appreciate cardiology consultation     FINAL CLINICAL IMPRESSION(S) / ED DIAGNOSES   Final diagnoses:  Acute pericarditis, unspecified type     Rx / DC Orders   ED Discharge Orders     None        Note:  This document was prepared using Dragon voice recognition software and may include unintentional dictation errors.   Jene Every, MD 12/13/22 1504

## 2022-12-13 NOTE — Telephone Encounter (Signed)
Spoke to [employer] .Marland [phone] and she is going to change orders and send for you to [facility] and call pt to schedule earlier appt

## 2022-12-13 NOTE — Consult Note (Addendum)
ELECTROPHYSIOLOGY CONSULT NOTE    Patient ID: Amber Barnes MRN: [id number], DOB/AGE: 1951/03/05 72 y.o.  Admit date: 12/13/2022 Date of Consult: 12/13/2022  Primary Physician: Allegra Grana, FNP Primary Cardiologist: Julien Nordmann, MD  Electrophysiologist: Dr. Lalla Brothers   Referring Provider: Dr. Cyril Loosen  Patient Profile: Amber Barnes is a 72 y.o. female with a history of parox AFib, HFpEF, COPD, OSA who is being seen today for the evaluation of chest pain with inspiration, elevated troponin at the request of Dr. Cyril Loosen.  HPI:  Amber Barnes is a 72 y.o. female with above PMH who is s/p AFib ablation yesterday with Dr. Lalla Brothers at [facility].  When she woke up this morning she had very intense chest pain, worse with lying down and deep breathing, also some jaw pain.  In ER, troponin elevated to >1700, has since down-trended to 1500. Sinus rhythm by EKG without ST elevation.  Her chest pain had improved some with ibuprofen and colchicine, has since returned. Not as bad as it was this morning.  She did take her eliquis prior to coming to ER this AM.  Updated echo done, read pending.   Labs Potassium3.7 (06/04 1025)   Creatinine, ser  0.85 (06/04 1025) PLT  149* (06/04 1025) HGB  13.3 (06/04 1025) WBC 11.3* (06/04 1025) Troponin I (High Sensitivity)1,506* (06/04 1141).    Past Medical History:  Diagnosis Date   Achilles tendinitis    Acute medial meniscal tear    Allergy    Anxiety    Atrial fibrillation (HCC)    a. new onset 07/2016; b. CHADS2VASc --> 4 (HTN, female, age, CHF); c. eliquis   Chemotherapy induced nausea and vomiting 05/18/2021   CHF (congestive heart failure) (HCC)    Chronic anticoagulation    Apixaban   Chronic lower back pain    Colon polyp    Complication of anesthesia    Emergence delirium; hypoxia   COPD (chronic obstructive pulmonary disease) (HCC)    Cystic breast    Depression    Endometriosis    Fibrocystic breast disease    GERD  (gastroesophageal reflux disease)    History of stress test    a. 07/2016 MV: EF 55-65%. Small, mild defect  in mid anteroseptal, apical septal, and apical locations. No ischemia-->low risk.   Hyperlipidemia    Hypertension    Insomnia    Lipoma    Malignant neoplasm of upper-outer quadrant of right female breast (HCC)    Stage 1A invasive mammary carcinoma (cT1b or T1c N0); ER (-), PR (+), HER2/neu (+)   Migraine    Mild Mitral regurgitation    a. 07/2016 Echo: EF 55-65%, no rwma, mild MR. Mildly dil LA. Nl RV fx.    Nephrolithiasis    OSA (obstructive sleep apnea)    non-compliant with nocturnal PAP therapy   Osteoarthritis    left knee   Osteopenia    Peptic ulcer disease    a. H. pylori   Pneumonia    RLS (restless legs syndrome)    Tendonitis    Thyroid disease    Vitamin D deficiency      Surgical History:  Past Surgical History:  Procedure Laterality Date   ABDOMINAL HYSTERECTOMY     ovaries left   ACHILLES TENDON SURGERY     2012,01/2017   ATRIAL FIBRILLATION ABLATION N/A 12/12/2022   Procedure: ATRIAL FIBRILLATION ABLATION;  Surgeon: Lanier Prude, MD;  Location: [facility];  Service: Cardiovascular;  Laterality: N/A;  BACK SURGERY     fusion lumbar   BREAST BIOPSY Right 01/15/2021   Affirm biopsy-"X" clip-INVASIVE MAMMARY CARCINOMA   BREAST BIOPSY Right 01/15/2021   u/s bx-"venus" clip INVASIVE MAMMARY CARCINOMA   BREAST BIOPSY     BREAST LUMPECTOMY,RADIO FREQ LOCALIZER,AXILLARY SENTINEL LYMPH NODE BIOPSY Right 02/17/2021   Procedure: BREAST LUMPECTOMY,RADIO FREQ LOCALIZER,AXILLARY SENTINEL LYMPH NODE BIOPSY;  Surgeon: Campbell Lerner, MD;  Location: [facility];  Service: General;  Laterality: Right;   CARDIOVERSION N/A 09/07/2016   Procedure: CARDIOVERSION;  Surgeon: Antonieta Iba, MD;  Location: [facility];  Service: Cardiovascular;  Laterality: N/A;   CARDIOVERSION N/A 10/19/2016   Procedure: CARDIOVERSION;  Surgeon: Antonieta Iba, MD;   Location: [facility];  Service: Cardiovascular;  Laterality: N/A;   CARDIOVERSION N/A 12/14/2016   Procedure: CARDIOVERSION;  Surgeon: Antonieta Iba, MD;  Location: [facility];  Service: Cardiovascular;  Laterality: N/A;   COLONOSCOPY     COLONOSCOPY WITH PROPOFOL N/A 05/13/2020   Procedure: COLONOSCOPY WITH PROPOFOL;  Surgeon: Toledo, Boykin Nearing, MD;  Location: [facility];  Service: Gastroenterology;  Laterality: N/A;  ELIQUIS   CYSTOSCOPY WITH STENT PLACEMENT Bilateral 07/12/2021   Procedure: CYSTOSCOPY WITH STENT PLACEMENT;  Surgeon: Sondra Come, MD;  Location: [facility];  Service: Urology;  Laterality: Bilateral;   CYSTOSCOPY/URETEROSCOPY/HOLMIUM LASER/STENT PLACEMENT Bilateral 07/30/2021   Procedure: CYSTOSCOPY/URETEROSCOPY/HOLMIUM LASER/BILATERAL STENT REMOVAL;  Surgeon: Sondra Come, MD;  Location: [facility];  Service: Urology;  Laterality: Bilateral;   ESOPHAGOGASTRODUODENOSCOPY     ESOPHAGOGASTRODUODENOSCOPY (EGD) WITH PROPOFOL N/A 05/13/2020   Procedure: ESOPHAGOGASTRODUODENOSCOPY (EGD) WITH PROPOFOL;  Surgeon: Toledo, Boykin Nearing, MD;  Location: [facility];  Service: Gastroenterology;  Laterality: N/A;   FOOT SURGERY Left    tendon   JOINT REPLACEMENT Right    hip   PORTA CATH INSERTION N/A 03/23/2021   Procedure: PORTA CATH INSERTION;  Surgeon: Renford Dills, MD;  Location: [facility];  Service: Cardiovascular;  Laterality: N/A;   PORTACATH PLACEMENT N/A 03/23/2021   Procedure: ATTEMPTED INSERTION PORT-A-CATH;  Surgeon: Campbell Lerner, MD;  Location: [facility];  Service: General;  Laterality: N/A;   THYROIDECTOMY  1990   half thyroid lobectomy secondary to a benign nodule   TOTAL HIP ARTHROPLASTY Right 08/21/2018   Procedure: TOTAL HIP ARTHROPLASTY ANTERIOR APPROACH-RIGHT CELL SAVER REQUESTED;  Surgeon: Kennedy Bucker, MD;  Location: [facility];  Service: Orthopedics;  Laterality: Right;     (Not in a hospital admission)   Inpatient Medications:    colchicine  0.6 mg Oral BID   ibuprofen  800 mg Oral Q8H   pantoprazole  40 mg Oral Daily    Allergies:  Allergies  Allergen Reactions   Prednisone Other (See Comments)    Agitation, hyperactivity, polyphagia   Amiodarone Nausea And Vomiting and Other (See Comments)   Hydrocodone-Acetaminophen Other (See Comments)    Hallucinations and combative   Oxycodone Itching    Can take it with benadryl   Tapentadol Itching   Tramadol Itching    Can take it with benadryl   Adhesive [Tape] Itching and Rash    Prefers paper tape   Latex Itching and Rash    use paper tap    Family History  Problem Relation Age of Onset   Cancer Mother        leukemia   Cancer Father 30       colon   Heart disease Father    Heart attack Father 48       died  from MI   Hyperlipidemia Father    Hypertension Father    Cancer Sister 26       ovarian   Depression Sister    Cancer Brother        CLL   Breast cancer Paternal Aunt        40's   Migraines Neg Hx    Thyroid cancer Neg Hx      Physical Exam: Vitals:   12/13/22 1009 12/13/22 1010 12/13/22 1130 12/13/22 1200  BP:  (!) 156/81  130/63  Pulse:  68 66 66  Resp:  18 20 (!) 21  Temp:  98.3 F (36.8 C)    TempSrc:  Oral    SpO2:  98% 93% 93%  Weight: 110.7 kg     Height: 5\' 5"  (1.651 m)       GEN- NAD, A&O x 3, normal affect HEENT: Normocephalic, atraumatic Lungs- CTAB, Normal effort.  Heart- Regular rate and rhythm, No M/G/R.  GI- Soft, NT, ND.  Extremities- No clubbing or cyanosis, trace edema   Radiology/Studies: DG Chest 2 View  Result Date: 12/13/2022 CLINICAL DATA:  Chest pain EXAM: CHEST - 2 VIEW COMPARISON:  X-ray 07/02/2021.  CT scan 03/03/2022 FINDINGS: Enlarged cardiopericardial silhouette with some vascular congestion. No pneumothorax, effusion or edema. Overlapping cardiac leads. Left upper chest IJ chest port with tip at the SVC right atrial junction region. Mild degenerative changes of the spine on lateral view.  IMPRESSION: Enlarged heart with vascular congestion.  Chest port Electronically Signed   By: Karen Kays M.D.   On: 12/13/2022 11:08   Amber STUDY  Result Date: 12/12/2022 CONCLUSIONS: 1.  Successful PVI 2.  Successful ablation/isolation of the posterior wall 3.  Successful ablation of the cavotricuspid isthmus for typical atrial flutter 4.  Intracardiac echo reveals trivial pericardial effusion, normal left atrial architecture 5.  No early apparent complications. 6.  Colchicine 0.6 mg by mouth twice daily for 5 days 7.  Protonix 40 mg by mouth once daily for 45 days   CT CARDIAC MORPH/PULM VEIN W/CM&W/O CA SCORE  Result Date: 12/09/2022 CLINICAL DATA:  Atrial fibrillation scheduled for an ablation. EXAM: Cardiac CT/CTA TECHNIQUE: The patient was scanned on a Siemens Somatom scanner. FINDINGS: A 120 kV prospective scan was triggered in the descending thoracic aorta at 111 HU's. Gantry rotation speed was 280 msecs and collimation was .9 mm. No beta blockade and no NTG was given. The 3D data set was reconstructed in 5% intervals of the 60-80 % of the R-R cycle. Diastolic phases were analyzed on a dedicated work station using MPR, MIP and VRT modes. The patient received 100 cc of contrast. There is normal pulmonary vein drainage into the left atrium (3 on the right and 2 on the left) with ostial measurements as follows: RUPV: 21 x 15 mm mm, Area 24 mm2 RMPV: 9 x 7 mm, Area 5 mm2 RLPV: 12 x 11 mm, Area 10 mm2 LUPV: 15 x 14 mm, Area 16 mm2 LLPV: 20 x 10 mm, Area 15 mm2 The left atrial appendage is a Windsock-cactus type with ostial size 28 x 23 mm and length 32 mm, Area 47 mm2. There is no thrombus in the left atrial appendage. The esophagus runs in the left atrial midline and is not in the proximity to any of the pulmonary veins. Aorta: Normal caliber. No dissection. Descending aorta calcifications. Aortic Valve:  Trileaflet.  No calcifications. Coronary Arteries: Normal coronary origin. Right dominance. The study  was performed  without use of NTG and insufficient for plaque evaluation. IMPRESSION: 1. There is normal pulmonary vein drainage into the left atrium. (3 on the right and 2 on the left) 2. The left atrial appendage is a Windsock-cactus type with ostial size 28 x 23 mm and length 32 mm, Area 47 mm2. There is no thrombus in the left atrial appendage. 3. The esophagus runs in the left atrial midline and is not in the proximity to any of the pulmonary veins. 4. Coronary calcium score 31.6. This was 58th percentile for age and gender matched controls. Electronically Signed   By: Debbe Odea M.D.   On: 12/09/2022 08:04    EKG:NSR, rate  (personally reviewed)  TELEMETRY: NSR 70-80s, rare PVC (personally reviewed)   Assessment/Plan: #) Pericarditis s/p AF ablation AF ablation yesterday w posterior wall and CVI with Dr. Lalla Brothers Echo does not appear to show effusion Trops downtrending EKG without ST elevation Has remained in NSR  Amber Barnes extend Colchicine 0.6 BID x 14d (previously 5d) Ibuprofen 600mg  BID PRN w food Continue protonix 40mg  daily x 45 days   #) Hypercoag d/t AFib Continue 5mg  eliquis BID for stroke ppx   Amber Barnes sign off at this time,  but remains available. Please reconsult if needed.  Medication Recommendations:  send additional colchicine 0.6mg  x 9 days to pharmacy to complete 14d course Other recommendations (labs, testing, etc):  none Follow up as an outpatient:  6/12 at 2:30 with PA Nelva Bush at AFib clinic  For questions or updates, please contact [facility] Please consult [email or web address] for contact info under Cardiology/STEMI.  Signed, Amber Don, NP  12/13/2022 1:23 PM   I have seen the patient via virtual consultation today.  The patient expressed their consent to participate in today's virtual consult. The patient was located at [facility] while I was located at [facility].  I have examined the patient and reviewed the above  assessment and plan.     -----------------   I have seen and examined this patient with Amber Barnes.  Agree with above, note added to reflect my findings.  Patient with a history of atrial fibrillation presented to the emergency room today with chest pain.  She had an ablation for atrial fibrillation yesterday.  Chest pain is worse when she takes a deep breath and when she lies flat.  It is improved when she sits up.  Her troponin was found to be significantly elevated.  She was given colchicine and ibuprofen with some improvement in discomfort.  On exam, she is lying in bed comfortably.  She is in no acute distress.  EKG shows sinus rhythm without ST or T wave changes and no PR depression.  Atrial fibrillation: Patient remains in sinus rhythm.  Post ablation.  Would continue anticoagulation at this time. Acute pericarditis: Likely as a consequence of atrial fibrillation ablation.  Troponin elevation likely due to recent ablation.  Echo without effusion.  Would be acceptable for discharge with medications as above.  Has follow-up arranged in clinic.  Amber to sign off.  Amber Betten M. Nakeitha Milligan MD 12/13/2022 4:28 PM   Caregility was used to complete today's consult.     Total time of encounter: 30 minutes total time of encounter, including face-to-face patient care, coordination of care and counseling regarding high complexity medical decision making re: pericarditis.   Z6109 - ER or Initial Inpatient 30 min U0454 - ER or Initial Inpatient 50 min U9811 - ER or Initial Inpatient 70 min  U9811 - Follow up inpatient consult limited 15 min G0407 - Follow up inpatient consult intermediate 25 min G0408 - Follow up inpatient consult complex 35 min  -GT modifier - via interactive audio and video telecommunication systems     Amber Kurihara Jorja Loa, MD 12/13/2022 4:28 PM

## 2022-12-13 NOTE — Assessment & Plan Note (Signed)
Patient has palpated right breast mass, approximately 3:00.  Nontender.  History of right breast cancer.  Diagnostic mammogram and right left breast ultrasound are scheduled 01/02/2023.  Call out to move appointment up.

## 2022-12-13 NOTE — Progress Notes (Addendum)
Virtual Visit via Video Note  I connected with Amber Barnes on 12/20/22 at  9:00 AM EDT by a video enabled telemedicine application and verified that I am speaking with the correct person using two identifiers. Location patient: home Location provider: work  Persons participating in the virtual visit: patient, provider  I discussed the limitations of evaluation and management by telemedicine and the availability of in person appointments. The patient expressed understanding and agreed to proceed.  Interactive audio and video telecommunications were attempted between this provider and patient, however failed, due to patient having technical difficulties or patient did not have access to video capability.  We continued and completed visit with audio only.   HPI: She complains right breast non tender mass the size at 3 oclock  of a pea noticed one week ago. Unchanged in size.   She has spoken to Dr Lalla Brothers 5 minutes ago as she describes 'soreness everytime I breathe.' Occurring at rest.  SOB is unchanged. 30 minutes ago she had numbness in jaw, since resolved.  Denies left arm numbness, diaphoresis  Husband and daughter are with her.   Denies left arm numbness , left jaw pain.   Ablation yesterday for persistent atrial fibrillation, Dr. Lalla Brothers  She is following Dr. Cathie Hoops, oncology.  History of right breast cancer Due bilateral mammogram diagnostic June 2024, scheduled    ROS: See pertinent positives and negatives per HPI.  EXAM:  VITALS per patient if applicable: Ht 5\' 5"  (1.651 m)   Wt 244 lb 3.2 oz (110.8 kg)   BMI 40.64 kg/m  BP Readings from Last 3 Encounters:  12/20/22 120/80  12/14/22 113/63  12/12/22 109/64   Wt Readings from Last 3 Encounters:  12/20/22 240 lb (108.9 kg)  12/20/22 243 lb (110.2 kg)  12/13/22 244 lb (110.7 kg)      ASSESSMENT AND PLAN: Chest pain, unspecified type Assessment & Plan: Chest pain with rest.  Patient currently at home with her  daughter and husband.  Patient had an ablation for atrial fibrillation yesterday, Dr. Lalla Brothers.  There is a call to his office however advised patient to go to the emergency room to be evaluated to ensure this is postoperative pain versus cardiac in nature.  She declined going by EMS.  She states her husband will drive her . she verbalized understanding. Triage report given to [facility] ED nurse Morrie Sheldon.    Mass of right breast, unspecified quadrant Assessment & Plan: Patient has palpated right breast mass, approximately 3:00.  Nontender.  History of right breast cancer.  Diagnostic mammogram and right left breast ultrasound are scheduled 01/02/2023.  Call out to move appointment up.    I spent 15 minutes with this patient on the phone.   -we discussed possible serious and likely etiologies, options for evaluation and workup, limitations of telemedicine visit vs in person visit, treatment, treatment risks and precautions. Pt prefers to treat via telemedicine empirically rather then risking or undertaking an in person visit at this moment.    I discussed the assessment and treatment plan with the patient. The patient was provided an opportunity to ask questions and all were answered. The patient agreed with the plan and demonstrated an understanding of the instructions.   The patient was advised to call back or seek an in-person evaluation if the symptoms worsen or if the condition fails to improve as anticipated.  Advised if desired AVS can be mailed or viewed via MyChart if Mychart user.   Rennie Plowman, FNP

## 2022-12-14 DIAGNOSIS — I309 Acute pericarditis, unspecified: Secondary | ICD-10-CM | POA: Diagnosis not present

## 2022-12-14 LAB — TROPONIN I (HIGH SENSITIVITY)
Troponin I (High Sensitivity): 1234 ng/L (ref <18)
Troponin I (High Sensitivity): 1076 ng/L (ref <18)

## 2022-12-14 LAB — CBC
HCT: 32.7 % — ABNORMAL LOW (ref 36.0–46.0)
Hemoglobin: 11 g/dL — ABNORMAL LOW (ref 12.0–15.0)
MCH: 32.4 pg (ref 26.0–34.0)
MCHC: 33.6 g/dL (ref 30.0–36.0)
MCV: 96.5 fL (ref 80.0–100.0)
Platelets: UNDETERMINED 10*3/uL (ref 150–400)
RBC: 3.39 MIL/uL — ABNORMAL LOW (ref 3.87–5.11)
RDW: 14.1 % (ref 11.5–15.5)
WBC: 7.3 10*3/uL (ref 4.0–10.5)
nRBC: 0 % (ref 0.0–0.2)

## 2022-12-14 LAB — LIPID PANEL
Cholesterol: 101 mg/dL (ref 0–200)
HDL: 48 mg/dL (ref >40)
LDL Cholesterol: 43 mg/dL (ref 0–99)
Total CHOL/HDL Ratio: 2.1 RATIO
Triglycerides: 51 mg/dL (ref <150)
VLDL: 10 mg/dL (ref 0–40)

## 2022-12-14 LAB — BASIC METABOLIC PANEL
Anion gap: 7 (ref 5–15)
BUN: 14 mg/dL (ref 8–23)
CO2: 24 mmol/L (ref 22–32)
Calcium: 8 mg/dL — ABNORMAL LOW (ref 8.9–10.3)
Chloride: 106 mmol/L (ref 98–111)
Creatinine, Ser: 0.77 mg/dL (ref 0.44–1.00)
GFR, Estimated: >60 mL/min (ref >60)
Glucose, Bld: 102 mg/dL — ABNORMAL HIGH (ref 70–99)
Potassium: 3.4 mmol/L — ABNORMAL LOW (ref 3.5–5.1)
Sodium: 137 mmol/L (ref 135–145)

## 2022-12-14 LAB — C-REACTIVE PROTEIN: CRP: 5.4 mg/dL — ABNORMAL HIGH (ref <1.0)

## 2022-12-14 LAB — HEMOGLOBIN A1C
Hgb A1c MFr Bld: 4.6 % — ABNORMAL LOW (ref 4.8–5.6)
Mean Plasma Glucose: 85 mg/dL

## 2022-12-14 MED ORDER — IBUPROFEN 600 MG PO TABS: 600.0000 mg | ORAL_TABLET | Freq: Two times a day (BID) | ORAL | 0 refills | Status: DC

## 2022-12-14 MED ORDER — COLCHICINE 0.6 MG PO TABS: 0.6000 mg | ORAL_TABLET | Freq: Two times a day (BID) | ORAL | 0 refills | Status: DC

## 2022-12-14 MED ORDER — IBUPROFEN 600 MG PO TABS: 600.0000 mg | ORAL_TABLET | Freq: Two times a day (BID) | ORAL | 0 refills | Status: DC | PRN

## 2022-12-14 NOTE — Progress Notes (Signed)
Transition of Care [facility]) - Inpatient Brief Assessment   Patient Details  Name: Amber Barnes MRN: [id number] Date of Birth: June 20, 1951  [facility] CM/SW Contact:    Truddie Hidden, RN Phone Number: 12/14/2022, 11:44 AM   Clinical Narrative: TOC continues to provide ongoing assessment for needs and discharge planning.   Transition of Care Asessment: Insurance and Status: Insurance coverage has been reviewed Patient has primary care physician: Yes Home environment has been reviewed: Plans to return home Prior level of function:: Independent Prior/Current Home Services: No current home services Social Determinants of Health Reivew: SDOH reviewed no interventions necessary Readmission risk has been reviewed: Yes Transition of care needs: no transition of care needs at this time

## 2022-12-14 NOTE — Discharge Instructions (Signed)
Continue your colchicine and ibuprofen with food.  Please follow up with cardiology as scheduled.

## 2022-12-14 NOTE — Progress Notes (Signed)
  Patient Name: Amber Barnes Date of Encounter: 12/14/2022  Primary Cardiologist: Julien Nordmann, MD Electrophysiologist: Lanier Prude, MD  Interval Summary   NAEON, tx out of ER.  Continues to have some chest pain, worse with hiccups.  Updated echo shows preserved EF, no effusion   Inpatient Medications    Scheduled Meds:  apixaban  5 mg Oral BID   bisoprolol  15 mg Oral BID   cholecalciferol  5,000 Units Oral Daily   colchicine  0.6 mg Oral BID   escitalopram  10 mg Oral Daily   flecainide  100 mg Oral BID   furosemide  20 mg Oral Daily   ibuprofen  600 mg Oral BID   multivitamin with minerals  1 tablet Oral Daily   pantoprazole  40 mg Oral BID   ranolazine  500 mg Oral BID   rOPINIRole  2.5 mg Oral QHS   rosuvastatin  40 mg Oral Daily   Continuous Infusions:  PRN Meds: acetaminophen, albuterol, ALPRAZolam, dextromethorphan-guaiFENesin, fentaNYL (SUBLIMAZE) injection, hydrALAZINE, ondansetron (ZOFRAN) IV   Vital Signs    Vitals:   12/14/22 0142 12/14/22 0204 12/14/22 0424 12/14/22 0725  BP: (!) 107/55 (!) 147/72 108/63 113/63  Pulse: (!) 59 65 (!) 59 (!) 57  Resp: 15 18 18 16   Temp:  98.3 F (36.8 C) 98.1 F (36.7 C) 97.9 F (36.6 C)  TempSrc:      SpO2: 94% 99% 98% 95%  Weight:      Height:       No intake or output data in the 24 hours ending 12/14/22 0805 Filed Weights   12/13/22 1009  Weight: 110.7 kg    Physical Exam    GEN- The patient is well appearing, alert and oriented x 3 today.   Lungs- Clear to ausculation bilaterally, normal work of breathing Cardiac- Regular rate and rhythm, no murmurs, rubs or gallops GI- soft, NT, ND, + BS Extremities- no clubbing or cyanosis. No edema  Telemetry    NSR (personally reviewed)  Hospital Course    Amber Barnes is a 72 y.o. female admitted for history of parox AFib, HFpEF, COPD, OSA admitted for pericarditis s/p AF ablation earlier this week  Assessment & Plan    #) Pericarditis  s/p AF ablation AF ablation 6/3 w posterior wall and CVI with Dr. Jon Gills echo [place] downtrending EKG without ST elevation Has remained in NSR  Will extend Colchicine 0.6 BID x 14d (previously 5d) Ibuprofen 600mg  BID PRN w food Continue protonix 40mg  daily x 45 days     #) Hypercoag d/t AFib Continue 5mg  eliquis BID for stroke ppx   From EP perspective, patient is ok to discharge   For questions or updates, please contact [facility] Please consult [email or web address] for contact info under Cardiology/STEMI.  Signed, Sherie Don, NP  12/14/2022, 8:05 AM

## 2022-12-14 NOTE — Discharge Summary (Signed)
Physician Discharge Summary  Patient: Amber Barnes WUJ:[id number] DOB: 1951/06/22   Code Status: Full Code Admit date: 12/13/2022 Discharge date: 12/14/2022 Disposition: Home, No home health services recommended PCP: Allegra Grana, FNP  Recommendations for Outpatient Follow-up:  Follow up with PCP within 1-2 weeks Regarding general hospital follow up and preventative care Follow up with cardiology   Discharge Diagnoses:  Principal Problem:   Pericarditis Active Problems:   Myocardial injury   PAF (paroxysmal atrial fibrillation) (HCC)   HTN (hypertension)   HLD (hyperlipidemia)   COPD (chronic obstructive pulmonary disease) (HCC)   Chronic diastolic CHF (congestive heart failure) (HCC)   GERD (gastroesophageal reflux disease)   Peptic ulcer   Anxiety and depression   Obesity, Class III, BMI 40-49.9 (morbid obesity) [facility])  Brief Hospital Course Summary:  Amber Barnes is a 72 y.o. female with medical history significant of atrial fibrillation on Eliquis, s/p of ablation 6/4, hypertension, hyperlipidemia, COPD, diastolic CHF, depression with anxiety, breast cancer (s/p of chemotherapy), morbid obesity, who presents with chest pain.  She underwent procedure of catheter ablation for both 6/3. She was noted to have a trivial pericardial effusion on intracardiac echo during procedure. She was placed on colchicine at discharge. She states that she started having chest pain since last night, which is located in the substernal area, sharp, 8 out of 10 severity, radiating to left jaw, pleuritic, aggravated by deep breath and laying flat.  Patient does not have shortness of breath, cough, fever or chills.  No nausea, vomiting, diarrhea or abdominal pain.  No symptoms of UTI.   ED Course: pt was found to have troponin 1791 --> 1506, BNP 76.9, WBC 11.3, GFR> 60, temperature normal, blood pressure 128/57, heart rate 69, RR 21, oxygen saturation 92-98% on room air.   Chest x-ray:  cardiomegaly and vascular congestion Echo limited: unremarkable  Dr. Okey Dupre of cardiology is consulted.   EKG: Sinus rhythm, QTc 494, borderline LAD, poor R wave progression, T wave inversion in V1-V5, PAC.  6/5: continues to have mild chest discomfort. Evaluated by cardiology/EP for pericarditis s/p AF ablation. Troponins trended down and remained in NSR on telemetry since admission. They prescribed colchicine and ibuprofen. She was able to discharge.  Discharge Condition: Good, improved Recommended discharge diet: Regular healthy diet  Consultations: Cardiology/EP  Procedures/Studies: Limited echo  Allergies as of 12/14/2022       Reactions   Prednisone Other (See Comments)   Agitation, hyperactivity, polyphagia   Amiodarone Nausea And Vomiting, Other (See Comments)   Hydrocodone-acetaminophen Other (See Comments)   Hallucinations and combative   Oxycodone Itching   Can take it with benadryl   Tapentadol Itching   Tramadol Itching   Can take it with benadryl   Adhesive [tape] Itching, Rash   Prefers paper tape   Latex Itching, Rash   use paper tap        Medication List     TAKE these medications    acetaminophen 500 MG tablet Commonly known as: TYLENOL Take 500-1,000 mg by mouth every 6 (six) hours as needed for mild pain or moderate pain.   ALPRAZolam 0.25 MG tablet Commonly known as: XANAX TAKE 1 TABLET BY MOUTH 2 TIMES DAILY AS NEEDED FOR ANXIETY.   bisoprolol 10 MG tablet Commonly known as: ZEBETA Take 1.5 tablets (15 mg total) by mouth 2 (two) times daily.   busPIRone 5 MG tablet Commonly known as: BUSPAR TAKE 1 TABLET BY MOUTH THREE TIMES A DAY AS NEEDED  clotrimazole 1 % cream Commonly known as: LOTRIMIN Apply 1 application topically 2 (two) times daily.   colchicine 0.6 MG tablet Take 1 tablet (0.6 mg total) by mouth 2 (two) times daily for 5 days. What changed: Another medication with the same name was added. Make sure you understand how and  when to take each.   colchicine 0.6 MG tablet Take 1 tablet (0.6 mg total) by mouth 2 (two) times daily for 9 days. Start taking on: December 17, 2022 What changed: You were already taking a medication with the same name, and this prescription was added. Make sure you understand how and when to take each.   diphenhydrAMINE 25 mg capsule Commonly known as: BENADRYL Take 1 capsule (25 mg total) by mouth every 8 (eight) hours as needed for itching or allergies.   Eliquis 5 MG Tabs tablet Generic drug: apixaban TAKE 1 TABLET BY MOUTH TWICE A DAY   escitalopram 10 MG tablet Commonly known as: Lexapro Take 1 tablet (10 mg total) by mouth daily.   flecainide 100 MG tablet Commonly known as: TAMBOCOR Take 1 tablet (100 mg total) by mouth 2 (two) times daily.   FLUoxetine 20 MG capsule Commonly known as: PROZAC TAKE 1 CAPSULE BY MOUTH EVERY DAY IN THE MORNING   furosemide 20 MG tablet Commonly known as: LASIX Take 1 tablet (20 mg total) by mouth daily.   ibuprofen 600 MG tablet Commonly known as: ADVIL Take 1 tablet (600 mg total) by mouth 2 (two) times daily as needed (chest pain).   meclizine 12.5 MG tablet Commonly known as: ANTIVERT Take 1 tablet (12.5 mg total) by mouth 3 (three) times daily as needed for dizziness.   multivitamin tablet Take 1 tablet by mouth daily.   nystatin cream Commonly known as: MYCOSTATIN Apply 1 application. topically 2 (two) times daily. Prn abdomen What changed:  when to take this reasons to take this   ondansetron 8 MG tablet Commonly known as: ZOFRAN Take 1 tablet (8 mg total) by mouth every 8 (eight) hours as needed.   pantoprazole 40 MG tablet Commonly known as: Protonix Take 1 tablet (40 mg total) by mouth daily.   potassium chloride SA 20 MEQ tablet Commonly known as: KLOR-CON M Take 1 tablet (20 mEq total) by mouth daily.   promethazine 25 MG tablet Commonly known as: PHENERGAN Take 1 tablet (25 mg total) by mouth every 6 (six)  hours as needed for nausea or vomiting.   ranolazine 500 MG 12 hr tablet Commonly known as: RANEXA TAKE 1 TABLET BY MOUTH TWICE A DAY   rizatriptan 10 MG tablet Commonly known as: MAXALT TAKE 1 TABLET (10 MG TOTAL) BY MOUTH AS NEEDED FOR MIGRAINE. MAY REPEAT IN 2 HOURS IF NEEDED   rOPINIRole 0.5 MG tablet Commonly known as: REQUIP Take 1 tablet (0.5 mg total) by mouth at bedtime.   rOPINIRole 2 MG tablet Commonly known as: REQUIP TAKE 1 TABLET BY MOUTH EVERYDAY AT BEDTIME   rosuvastatin 40 MG tablet Commonly known as: CRESTOR Take 1 tablet (40 mg total) by mouth daily.   Vitamin D3 125 MCG (5000 UT) Caps Take 5,000 Units by mouth daily.        Follow-up Information     Sherie Don, NP. Go on 12/20/2022.   Specialty: Cardiology Why: at [facility] information: [address] [place] [address] [phone]                 Subjective  Pt reports having mild chest discomfort. Denies SOB, cough. Denies palpitations. States that she does not have GERD like symptoms. She feels comfortable going home with cardiology follow up.   All questions and concerns were addressed at time of discharge.  Objective  Blood pressure 113/63, pulse (!) 57, temperature 97.9 F (36.6 C), resp. rate 16, height 5\' 5"  (1.651 m), weight 110.7 kg, SpO2 95 %.   General: Pt is alert, awake, not in acute distress Cardiovascular: RRR, S1/S2 +, no rubs, no gallops Respiratory: CTA bilaterally, no wheezing, no rhonchi Abdominal: Soft, NT, ND, bowel sounds + Extremities: no edema, no cyanosis  The results of significant diagnostics from this hospitalization (including imaging, microbiology, ancillary and laboratory) are listed below for reference.   Imaging studies: CT CARDIAC MORPH/PULM VEIN W/CM&W/O CA SCORE  Addendum Date: 12/14/2022   ADDENDUM REPORT: 12/14/2022 02:10 EXAM: OVER-READ INTERPRETATION  CT CHEST The following report is an over-read performed by radiologist  Dr. Aram Candela of [facility] on 12/14/2022. This over-read does not include interpretation of cardiac or coronary anatomy or pathology. The coronary calcium score/coronary CTA interpretation by the cardiologist is attached. COMPARISON:  March 03, 2022 FINDINGS: Cardiovascular: There are no significant extracardiac vascular findings. Mediastinum/Nodes: There are no enlarged lymph nodes within the visualized mediastinum. Lungs/Pleura: There is no pleural effusion. The visualized lungs appear clear. Upper abdomen: No significant findings in the visualized upper abdomen. Musculoskeletal/Chest wall: No chest wall mass or suspicious osseous findings within the visualized chest. IMPRESSION: No significant extracardiac findings within the visualized chest. Electronically Signed   By: Aram Candela M.D.   On: 12/14/2022 02:10   Result Date: 12/14/2022 CLINICAL DATA:  Atrial fibrillation scheduled for an ablation. EXAM: Cardiac CT/CTA TECHNIQUE: The patient was scanned on a Siemens Somatom scanner. FINDINGS: A 120 kV prospective scan was triggered in the descending thoracic aorta at 111 HU's. Gantry rotation speed was 280 msecs and collimation was .9 mm. No beta blockade and no NTG was given. The 3D data set was reconstructed in 5% intervals of the 60-80 % of the R-R cycle. Diastolic phases were analyzed on a dedicated work station using MPR, MIP and VRT modes. The patient received 100 cc of contrast. There is normal pulmonary vein drainage into the left atrium (3 on the right and 2 on the left) with ostial measurements as follows: RUPV: 21 x 15 mm mm, Area 24 mm2 RMPV: 9 x 7 mm, Area 5 mm2 RLPV: 12 x 11 mm, Area 10 mm2 LUPV: 15 x 14 mm, Area 16 mm2 LLPV: 20 x 10 mm, Area 15 mm2 The left atrial appendage is a Windsock-cactus type with ostial size 28 x 23 mm and length 32 mm, Area 47 mm2. There is no thrombus in the left atrial appendage. The esophagus runs in the left atrial midline and is not in the  proximity to any of the pulmonary veins. Aorta: Normal caliber. No dissection. Descending aorta calcifications. Aortic Valve:  Trileaflet.  No calcifications. Coronary Arteries: Normal coronary origin. Right dominance. The study was performed without use of NTG and insufficient for plaque evaluation. IMPRESSION: 1. There is normal pulmonary vein drainage into the left atrium. (3 on the right and 2 on the left) 2. The left atrial appendage is a Windsock-cactus type with ostial size 28 x 23 mm and length 32 mm, Area 47 mm2. There is no thrombus in the left atrial appendage. 3. The esophagus runs in the left atrial midline and is not in the  proximity to any of the pulmonary veins. 4. Coronary calcium score 31.6. This was 58th percentile for age and gender matched controls. Electronically Signed: By: Debbe Odea M.D. On: 12/09/2022 08:04   ECHOCARDIOGRAM LIMITED  Result Date: 12/13/2022    ECHOCARDIOGRAM LIMITED REPORT   Patient Name:   Amber Barnes Date of Exam: 12/13/2022 Medical Rec #:  [id number]        Height:       65.0 in Accession #:    [id number]       Weight:       244.0 lb Date of Birth:  01-Feb-1951        BSA:          2.153 m Patient Age:    72 years         BP:           156/87 mmHg Patient Gender: F                HR:           66 bpm. Exam Location:  [facility] Procedure: Limited Echo Indications:     Chest pain, Pericardial effusion  History:         Patient has prior history of Echocardiogram examinations, most                  recent 10/20/2022. CHF, COPD, Arrythmias:Atrial Fibrillation and                  Bradycardia, Signs/Symptoms:Chest Pain; Risk Factors:Sleep                  Apnea and Dyslipidemia. Breast CA, S/P a-fib ablation 12/12/22.  Sonographer:     Mikki Harbor Referring Phys:  [id number] CHRISTOPHER END Diagnosing Phys: Yvonne Kendall MD  Sonographer Comments: Patient is obese. IMPRESSIONS  1. Left ventricular ejection fraction, by estimation, is 50 to 55%. The left ventricle has low  normal function. The left ventricle has no regional wall motion abnormalities. There is mild left ventricular hypertrophy.  2. Moderate mitral annular calcification.  3. The inferior vena cava is dilated in size with <50% respiratory variability, suggesting right atrial pressure of 15 mmHg. FINDINGS  Left Ventricle: Left ventricular ejection fraction, by estimation, is 50 to 55%. The left ventricle has low normal function. The left ventricle has no regional wall motion abnormalities. The left ventricular internal cavity size was normal in size. There is mild left ventricular hypertrophy. Right Ventricle: No increase in right ventricular wall thickness. Pericardium: There is no evidence of pericardial effusion. Mitral Valve: Moderate mitral annular calcification. Tricuspid Valve: The tricuspid valve is grossly normal. Aorta: The aortic root is normal in size and structure. Venous: The inferior vena cava is dilated in size with less than 50% respiratory variability, suggesting right atrial pressure of 15 mmHg. LEFT VENTRICLE PLAX 2D LVIDd:         4.90 cm LVIDs:         3.60 cm LV PW:         1.30 cm LV IVS:        1.20 cm LVOT diam:     2.10 cm LVOT Area:     3.46 cm  LV Volumes (MOD) LV vol d, MOD A2C: 59.4 ml LV vol d, MOD A4C: 71.2 ml LV vol s, MOD A2C: 34.6 ml LV vol s, MOD A4C: 26.2 ml LV SV MOD A2C:     24.8 ml LV SV MOD A4C:  71.2 ml LV SV MOD BP:      35.8 ml LEFT ATRIUM         Index LA diam:    3.90 cm 1.81 cm/m   AORTA Ao Root diam: 3.50 cm  SHUNTS Systemic Diam: 2.10 cm Yvonne Kendall MD Electronically signed by Yvonne Kendall MD Signature Date/Time: 12/13/2022/1:53:34 PM    Final    DG Chest 2 View  Result Date: 12/13/2022 CLINICAL DATA:  Chest pain EXAM: CHEST - 2 VIEW COMPARISON:  X-ray 07/02/2021.  CT scan 03/03/2022 FINDINGS: Enlarged cardiopericardial silhouette with some vascular congestion. No pneumothorax, effusion or edema. Overlapping cardiac leads. Left upper chest IJ chest port with  tip at the SVC right atrial junction region. Mild degenerative changes of the spine on lateral view. IMPRESSION: Enlarged heart with vascular congestion.  Chest port Electronically Signed   By: Karen Kays M.D.   On: 12/13/2022 11:08   EP STUDY  Result Date: 12/12/2022 CONCLUSIONS: 1.  Successful PVI 2.  Successful ablation/isolation of the posterior wall 3.  Successful ablation of the cavotricuspid isthmus for typical atrial flutter 4.  Intracardiac echo reveals trivial pericardial effusion, normal left atrial architecture 5.  No early apparent complications. 6.  Colchicine 0.6 mg by mouth twice daily for 5 days 7.  Protonix 40 mg by mouth once daily for 45 days    Labs: Basic Metabolic Panel: Recent Labs  Lab 12/13/22 1025 12/14/22 0449  NA 138 137  K 3.7 3.4*  CL 103 106  CO2 29 24  GLUCOSE 127* 102*  BUN 14 14  CREATININE 0.85 0.77  CALCIUM 8.5* 8.0*   CBC: Recent Labs  Lab 12/12/22 1117 12/13/22 1025 12/14/22 0449  WBC 5.1 11.3* 7.3  HGB 13.5 13.3 11.0*  HCT 39.6 40.6 32.7*  MCV 93.4 97.4 96.5  PLT 159 149* PLATELET CLUMPS NOTED ON SMEAR, UNABLE TO ESTIMATE   Microbiology: Results for orders placed or performed during the hospital encounter of 07/12/21  Urine Culture     Status: Abnormal   Collection Time: 07/12/21  8:48 AM   Specimen: Urine, Random  Result Value Ref Range Status   Specimen Description   Final    URINE, RANDOM Performed at [facility], [address], [place], [place] [address]    Special Requests   Final    NONE Performed at [facility], [address] [place], [place] [address]    Culture (A)  Final    <10,000 COLONIES/mL INSIGNIFICANT GROWTH Performed at [facility], [address] [place], [place] [address]    Report Status 07/13/2021 FINAL  Final  Resp Panel by RT-PCR (Flu A&B, Covid) Nasopharyngeal Swab     Status: None   Collection Time: 07/12/21  9:45 AM   Specimen: Nasopharyngeal Swab;  Nasopharyngeal(NP) swabs in vial transport medium  Result Value Ref Range Status   SARS Coronavirus 2 by RT PCR NEGATIVE NEGATIVE Final    Comment: (NOTE) SARS-CoV-2 target nucleic acids are NOT DETECTED.  The SARS-CoV-2 RNA is generally detectable in upper respiratory specimens during the acute phase of infection. The lowest concentration of SARS-CoV-2 viral copies this assay can detect is 138 copies/mL. A negative result does not preclude SARS-Cov-2 infection and should not be used as the sole basis for treatment or other patient management decisions. A negative result may occur with  improper specimen collection/handling, submission of specimen other than nasopharyngeal swab, presence of viral mutation(s) within the areas targeted by this assay, and  inadequate number of viral copies(<138 copies/mL). A negative result must be combined with clinical observations, patient history, and epidemiological information. The expected result is Negative.  Fact Sheet for Patients:  BloggerCourse.com  Fact Sheet for Healthcare Providers:  SeriousBroker.it  This test is no t yet approved or cleared by the [place] FDA and  has been authorized for detection and/or diagnosis of SARS-CoV-2 by FDA under an Emergency Use Authorization (EUA). This EUA will remain  in effect (meaning this test can be used) for the duration of the COVID-19 declaration under Section 564(b)(1) of the Act, 21 U.S.C.section 360bbb-3(b)(1), unless the authorization is terminated  or revoked sooner.       Influenza A by PCR NEGATIVE NEGATIVE Final   Influenza B by PCR NEGATIVE NEGATIVE Final    Comment: (NOTE) The Xpert Xpress SARS-CoV-2/FLU/RSV plus assay is intended as an aid in the diagnosis of influenza from Nasopharyngeal swab specimens and should not be used as a sole basis for treatment. Nasal washings and aspirates are unacceptable for Xpert Xpress  SARS-CoV-2/FLU/RSV testing.  Fact Sheet for Patients: BloggerCourse.com  Fact Sheet for Healthcare Providers: SeriousBroker.it  This test is not yet approved or cleared by the [place] FDA and has been authorized for detection and/or diagnosis of SARS-CoV-2 by FDA under an Emergency Use Authorization (EUA). This EUA will remain in effect (meaning this test can be used) for the duration of the COVID-19 declaration under Section 564(b)(1) of the Act, 21 U.S.C. section 360bbb-3(b)(1), unless the authorization is terminated or revoked.  Performed at [facility], [address] [place], [place] [address]   Culture, blood (Routine X 2) w Reflex to ID Panel     Status: None   Collection Time: 07/12/21 11:57 PM   Specimen: BLOOD  Result Value Ref Range Status   Specimen Description BLOOD LEFT WRIST  Final   Special Requests   Final    IN PEDIATRIC BOTTLE Blood Culture results may not be optimal due to an excessive volume of blood received in culture bottles   Culture   Final    NO GROWTH 5 DAYS Performed at [facility], [address] [place], [place] [address]    Report Status 07/18/2021 FINAL  Final  Culture, blood (Routine X 2) w Reflex to ID Panel     Status: None   Collection Time: 07/12/21 11:57 PM   Specimen: BLOOD  Result Value Ref Range Status   Specimen Description BLOOD LEFT ASSIST CONTROL  Final   Special Requests   Final    BOTTLES DRAWN AEROBIC AND ANAEROBIC Blood Culture results may not be optimal due to an excessive volume of blood received in culture bottles   Culture   Final    NO GROWTH 5 DAYS Performed at [facility], [address] [place], [place] [address]    Report Status 07/18/2021 FINAL  Final   *Note: Due to a large number of results and/or encounters for the requested time period, some results have not been displayed. A complete set of results can be found in  Results Review.   Time coordinating discharge: Over 30 minutes  Leeroy Bock, MD  [facility] 12/14/2022, 9:56 AM

## 2022-12-15 LAB — MISC LABCORP TEST (SEND OUT): Labcorp test code: 83935

## 2022-12-19 NOTE — Progress Notes (Signed)
Cardiology Office Note Date:  12/20/2022  Patient ID:  Amber Barnes, Amber Barnes 21-Oct-1950, MRN [id number] PCP:  Allegra Grana, FNP  Cardiologist:  Julien Nordmann, MD Electrophysiologist: Lanier Prude, MD    Chief Complaint: Afib ablation follow-up  History of Present Illness: Suellyn Meenan is a 72 y.o. female with PMH notable for parox AFib, HTN, OSA, COPD; seen today for Lanier Prude, MD for post hospital follow up.   S/p Afib ablation by Dr. Lalla Brothers on 12/12/2022. She has not had any episodes of Afib since ablation.  She had considerable chest pain post-procedure, was eval'd at [facility] ER, found to have pericarditis. Started on ibuprofen 600mg  BID, colchicine duration increased to 2 weeks.   Today, she states her main complaint is nausea, vomiting, and diarrhea.  She thinks one of her new medicines may be to blame.  She has had very little energy since her procedure, thinks this is because of her vomiting and diarrhea.  Her chest pain has completely resolved.  She has not taken as needed ibuprofen in 4 days. She does not think she has had any A-fib recurrence since ablation procedure.  She is continuing to take flecainide and bisoprolol twice daily, no missed doses.  She does question whether she will continue to take these medications in the future.  No chest pain, shortness of breath, or trouble swallowing. Leg sites healed without issue.   She is compliant with anticoagulation and has not missed any doses.  She has no bleeding concerns.   AAD History: Flecainide   Past Medical History:  Diagnosis Date   Achilles tendinitis    Acute medial meniscal tear    Allergy    Anxiety    Atrial fibrillation (HCC)    a. new onset 07/2016; b. CHADS2VASc --> 4 (HTN, female, age, CHF)-->eliquis; c. 12/2022 s/p PVI.   Chemotherapy induced nausea and vomiting 05/18/2021   Chronic anticoagulation    Apixaban   Chronic heart failure with mid-range ejection fraction (HFmrEF) (HCC)     a. 10/2022 Echo: EF 45-50%, glob HK, mod reduced RV fxn, RVSP , mildly dil LA, mild MR, mild-mod TR. Asc Ao 39mm.   Chronic lower back pain    CKD (chronic kidney disease), stage II    Colon polyp    Complication of anesthesia    Emergence delirium; hypoxia   COPD (chronic obstructive pulmonary disease) (HCC)    Cystic breast    Depression    Endometriosis    Fibrocystic breast disease    GERD (gastroesophageal reflux disease)    History of stress test    a. 07/2016 MV: EF 55-65%. Small, mild defect  in mid anteroseptal, apical septal, and apical locations. No ischemia-->low risk; b. 10/2022 MV: mod sized partially rev apical ant, apical septal, and apical defect. EF 52%. No signific cor Ca2+; b. 11/2022 Cardiac CT: Ca2+ = 31.6 (58th%'ile).   Hyperlipidemia    Hypertension    Insomnia    Lipoma    Malignant neoplasm of upper-outer quadrant of right female breast (HCC)    Stage 1A invasive mammary carcinoma (cT1b or T1c N0); ER (-), PR (+), HER2/neu (+)   Migraine    Mild Mitral regurgitation    a. 07/2016 Echo: Mild MR; b. 10/2022 Echo: Mild MR.   Nephrolithiasis    OSA (obstructive sleep apnea)    non-compliant with nocturnal PAP therapy   Osteoarthritis    left knee   Osteopenia    Peptic ulcer disease  a. H. pylori   Pneumonia    RLS (restless legs syndrome)    Tendonitis    Thyroid disease    Tricuspid regurgitation    a. 10/2022 Echo: mild-mod TR.   Vitamin D deficiency     Past Surgical History:  Procedure Laterality Date   ABDOMINAL HYSTERECTOMY     ovaries left   ACHILLES TENDON SURGERY     2012,01/2017   ATRIAL FIBRILLATION ABLATION N/A 12/12/2022   Procedure: ATRIAL FIBRILLATION ABLATION;  Surgeon: Lanier Prude, MD;  Location: [facility];  Service: Cardiovascular;  Laterality: N/A;   BACK SURGERY     fusion lumbar   BREAST BIOPSY Right 01/15/2021   Affirm biopsy-"X" clip-INVASIVE MAMMARY CARCINOMA   BREAST BIOPSY Right 01/15/2021   u/s  bx-"venus" clip INVASIVE MAMMARY CARCINOMA   BREAST BIOPSY     BREAST LUMPECTOMY,RADIO FREQ LOCALIZER,AXILLARY SENTINEL LYMPH NODE BIOPSY Right 02/17/2021   Procedure: BREAST LUMPECTOMY,RADIO FREQ LOCALIZER,AXILLARY SENTINEL LYMPH NODE BIOPSY;  Surgeon: Campbell Lerner, MD;  Location: [facility];  Service: General;  Laterality: Right;   CARDIOVERSION N/A 09/07/2016   Procedure: CARDIOVERSION;  Surgeon: Antonieta Iba, MD;  Location: [facility];  Service: Cardiovascular;  Laterality: N/A;   CARDIOVERSION N/A 10/19/2016   Procedure: CARDIOVERSION;  Surgeon: Antonieta Iba, MD;  Location: [facility];  Service: Cardiovascular;  Laterality: N/A;   CARDIOVERSION N/A 12/14/2016   Procedure: CARDIOVERSION;  Surgeon: Antonieta Iba, MD;  Location: [facility];  Service: Cardiovascular;  Laterality: N/A;   COLONOSCOPY     COLONOSCOPY WITH PROPOFOL N/A 05/13/2020   Procedure: COLONOSCOPY WITH PROPOFOL;  Surgeon: Toledo, Boykin Nearing, MD;  Location: [facility];  Service: Gastroenterology;  Laterality: N/A;  ELIQUIS   CYSTOSCOPY WITH STENT PLACEMENT Bilateral 07/12/2021   Procedure: CYSTOSCOPY WITH STENT PLACEMENT;  Surgeon: Sondra Come, MD;  Location: [facility];  Service: Urology;  Laterality: Bilateral;   CYSTOSCOPY/URETEROSCOPY/HOLMIUM LASER/STENT PLACEMENT Bilateral 07/30/2021   Procedure: CYSTOSCOPY/URETEROSCOPY/HOLMIUM LASER/BILATERAL STENT REMOVAL;  Surgeon: Sondra Come, MD;  Location: [facility];  Service: Urology;  Laterality: Bilateral;   ESOPHAGOGASTRODUODENOSCOPY     ESOPHAGOGASTRODUODENOSCOPY (EGD) WITH PROPOFOL N/A 05/13/2020   Procedure: ESOPHAGOGASTRODUODENOSCOPY (EGD) WITH PROPOFOL;  Surgeon: Toledo, Boykin Nearing, MD;  Location: [facility];  Service: Gastroenterology;  Laterality: N/A;   FOOT SURGERY Left    tendon   JOINT REPLACEMENT Right    hip   PORTA CATH INSERTION N/A 03/23/2021   Procedure: PORTA CATH INSERTION;  Surgeon: Renford Dills, MD;  Location: [facility];  Service: Cardiovascular;  Laterality: N/A;   PORTACATH PLACEMENT N/A 03/23/2021   Procedure: ATTEMPTED INSERTION PORT-A-CATH;  Surgeon: Campbell Lerner, MD;  Location: [facility];  Service: General;  Laterality: N/A;   THYROIDECTOMY  1990   half thyroid lobectomy secondary to a benign nodule   TOTAL HIP ARTHROPLASTY Right 08/21/2018   Procedure: TOTAL HIP ARTHROPLASTY ANTERIOR APPROACH-RIGHT CELL SAVER REQUESTED;  Surgeon: Kennedy Bucker, MD;  Location: [facility];  Service: Orthopedics;  Laterality: Right;    Current Outpatient Medications  Medication Instructions   acetaminophen (TYLENOL) 500-1,000 mg, Oral, Every 6 hours PRN   ALPRAZolam (XANAX) 0.25 mg, Oral, 2 times daily PRN   bisoprolol (ZEBETA) 15 mg, Oral, 2 times daily   busPIRone (BUSPAR) 5 mg, Oral, 3 times daily PRN   clotrimazole (LOTRIMIN) 1 % cream 1 application , Topical, 2 times daily   colchicine 0.6 mg, Oral, Daily   diphenhydrAMINE (BENADRYL) 25 mg, Oral, Every 8 hours  PRN   ELIQUIS 5 MG TABS tablet TAKE 1 TABLET BY MOUTH TWICE A DAY   escitalopram (LEXAPRO) 10 mg, Oral, Daily   flecainide (TAMBOCOR) 100 mg, Oral, 2 times daily   furosemide (LASIX) 20 mg, Oral, Daily   ibuprofen (ADVIL) 600 mg, Oral, 2 times daily PRN   Multiple Vitamin (MULTIVITAMIN) tablet 1 tablet, Oral, Daily   nystatin cream (MYCOSTATIN) 1 application , Topical, 2 times daily, Prn abdomen   ondansetron (ZOFRAN) 8 mg, Oral, Every 8 hours PRN   pantoprazole (PROTONIX) 40 mg, Oral, Daily   potassium chloride SA (KLOR-CON M) 20 MEQ tablet 20 mEq, Oral, Daily   ranolazine (RANEXA) 500 MG 12 hr tablet TAKE 1 TABLET BY MOUTH TWICE A DAY   rOPINIRole (REQUIP) 2 MG tablet TAKE 1 TABLET BY MOUTH EVERYDAY AT BEDTIME   rOPINIRole (REQUIP) 0.5 mg, Oral, Daily at bedtime   rosuvastatin (CRESTOR) 40 mg, Oral, Daily   Vitamin D3 5,000 Units, Oral, Daily    Social History:  The patient  reports that she quit smoking about 19 years ago. Her smoking use  included cigarettes. She has a 2.50 pack-year smoking history. She has never used smokeless tobacco. She reports that she does not currently use alcohol after a past usage of about 1.0 standard drink of alcohol per week. She reports that she does not use drugs.   Family History:  The patient's family history includes Breast cancer in her paternal aunt; Cancer in her brother and mother; Cancer (age of onset: 74) in her sister; Cancer (age of onset: 21) in her father; Depression in her sister; Heart attack (age of onset: 27) in her father; Heart disease in her father; Hyperlipidemia in her father; Hypertension in her father.  ROS:  Please see the history of present illness. All other systems are reviewed and otherwise negative.   PHYSICAL EXAM:  VS:  BP 120/80 (BP Location: Left Arm, Patient Position: Sitting, Cuff Size: Large)   Pulse (!) 57   Ht 5\' 5"  (1.651 m)   Wt 240 lb (108.9 kg)   SpO2 96%   BMI 39.94 kg/m  BMI: Body mass index is 39.94 kg/m.  GEN- The patient is well appearing, alert and oriented x 3 today.   Lungs- Clear to ausculation bilaterally, normal work of breathing.  Heart- Regular rate and rhythm, no murmurs, rubs or gallops Extremities- Trace peripheral edema, warm, dry   EKG is ordered. Personal review of EKG from today shows: Sinus bradycardia with first-degree AV block, rate 57  PR 220 -stable compared to prior  Recent Labs: 09/12/2022: TSH 3.40 10/31/2022: ALT 13 12/13/2022: B Natriuretic Peptide 76.9 12/14/2022: BUN 14; Creatinine, Ser 0.77; Hemoglobin 11.0; Platelets PLATELET CLUMPS NOTED ON SMEAR, UNABLE TO ESTIMATE; Potassium 3.4; Sodium 137  12/14/2022: Cholesterol 101; HDL 48; LDL Cholesterol 43; Total CHOL/HDL Ratio 2.1; Triglycerides 51; VLDL 10   Estimated Creatinine Clearance: 80.4 mL/min (by C-G formula based on SCr of 0.77 mg/dL).   Wt Readings from Last 3 Encounters:  12/20/22 240 lb (108.9 kg)  12/20/22 243 lb (110.2 kg)  12/13/22 244 lb (110.7 kg)      Additional studies reviewed include: Previous EP, cardiology notes.   TTE, 12/13/2022  1. Left ventricular ejection fraction, by estimation, is 50 to 55%. The left ventricle has low normal function. The left ventricle has no regional wall motion abnormalities. There is mild left ventricular hypertrophy.   2. Moderate mitral annular calcification.   3. The inferior vena cava is  dilated in size with <50% respiratory variability, suggesting right atrial pressure of 15 mmHg.   AF ablation, 12/12/2022 1.  Successful PVI 2.  Successful ablation/isolation of the posterior wall 3.  Successful ablation of the cavotricuspid isthmus for typical atrial flutter 4.  Intracardiac echo reveals trivial pericardial effusion, normal left atrial architecture 5.  No early apparent complications. 6.  Colchicine 0.6 mg by mouth twice daily for 5 days 7.  Protonix 40 mg by mouth once daily for 45 days  ASSESSMENT AND PLAN:  #) Pericarditis s/p AF ablation S/p AF ablation 6/3 Chest pain and pressure post-procedurally Echo reassuring without effusion Chest pain has since resolved Discussed that gastrointestinal distress was likely due to colchicine.  Will decrease colchicine to 0.6 daily x 7 days.  If vomiting and diarrhea continue at lower dose, she may stop it Cont protonix 45d post-procedurally, stop when rx ends  #) persis AFib S/p ablation Groin sites well-healed No AF episodes since procedure Cont flecainide + bisoprolol  #) Hypercoag d/t AFib CHA2DS2-VASc Score = 5 [CHF History: 1, HTN History: 1, Diabetes History: 1, Stroke History: 0, Vascular Disease History: 0, Age Score: 1, Gender Score: 1].  Therefore, the patient's annual risk of stroke is 7.2 %.    NOAC - eliquis 5mg  BID, appropriately dosed No bleeding concerns   Current medicines are reviewed at length with the patient today.   The patient has concerns regarding her medicines.  The following changes were made today:   DECREASE  colchicine to 0.6mg  daily STOP ibuprofen PRN  Labs/ tests ordered today include:  Orders Placed This Encounter  Procedures   EKG 12-Lead     Disposition: Follow up with EP APP in in 4 weeks  She prefers to be seen at [facility] heart care office as opposed to the A-fib clinic in [place] for routine post A-fib appointment.  Will coordinate   Signed, Sherie Don, NP  12/20/22  2:59 PM  Electrophysiology [facility]

## 2022-12-20 ENCOUNTER — Ambulatory Visit (INDEPENDENT_AMBULATORY_CARE_PROVIDER_SITE_OTHER): Payer: Medicare Other

## 2022-12-20 ENCOUNTER — Ambulatory Visit: Payer: Medicare Other | Attending: Internal Medicine | Admitting: Cardiology

## 2022-12-20 ENCOUNTER — Encounter: Payer: Self-pay | Admitting: Cardiology

## 2022-12-20 VITALS — Ht 65.0 in | Wt 243.0 lb

## 2022-12-20 VITALS — BP 120/80 | HR 57 | Ht 65.0 in | Wt 240.0 lb

## 2022-12-20 DIAGNOSIS — D6869 Other thrombophilia: Secondary | ICD-10-CM

## 2022-12-20 DIAGNOSIS — Z0000 Encounter for general adult medical examination without abnormal findings: Secondary | ICD-10-CM | POA: Diagnosis not present

## 2022-12-20 DIAGNOSIS — I4819 Other persistent atrial fibrillation: Secondary | ICD-10-CM

## 2022-12-20 DIAGNOSIS — I309 Acute pericarditis, unspecified: Secondary | ICD-10-CM | POA: Diagnosis not present

## 2022-12-20 MED ORDER — COLCHICINE 0.6 MG PO TABS: 0.6000 mg | ORAL_TABLET | Freq: Every day | ORAL | 0 refills | Status: DC

## 2022-12-20 NOTE — Progress Notes (Signed)
I connected with  Amber Barnes on 12/20/22 by a audio enabled telemedicine application and verified that I am speaking with the correct person using two identifiers.  Patient Location: Home  Provider Location: Office/Clinic  I discussed the limitations of evaluation and management by telemedicine. The patient expressed understanding and agreed to proceed.  Subjective:   Amber Barnes is a 72 y.o. female who presents for Medicare Annual (Subsequent) preventive examination.  Review of Systems     Cardiac Risk Factors include: advanced age (>78men, >52 women);hypertension;sedentary lifestyle     Objective:    Today's Vitals   12/20/22 0934 12/20/22 0949  Weight:  243 lb (110.2 kg)  Height:  5\' 5"  (1.651 m)  PainSc: 4     Body mass index is 40.44 kg/m.     12/20/2022    9:40 AM 12/14/2022    2:01 AM 12/13/2022   10:10 AM 12/12/2022   11:46 AM 10/31/2022    2:07 PM 09/26/2022   10:34 AM 06/15/2022    2:08 PM  Advanced Directives  Does Patient Have a Medical Advance Directive? Yes Yes No Yes Yes Yes Yes  Type of Estate agent of [place];Living will Healthcare Power of [place];Living will  Healthcare Power of [place];Living will Healthcare Power of [place];Living will Healthcare Power of [place];Living will Healthcare Power of [place];Living will  Does patient want to make changes to medical advance directive?  No - Patient declined  No - Patient declined  No - Patient declined   Copy of Healthcare Power of Attorney in Chart? No - copy requested No - copy requested  No - copy requested No - copy requested No - copy requested   Would patient like information on creating a medical advance directive?     No - Patient declined      Current Medications (verified) Outpatient Encounter Medications as of 12/20/2022  Medication Sig   acetaminophen (TYLENOL) 500 MG tablet Take 500-1,000 mg by mouth every 6 (six) hours as needed for mild pain or moderate pain.    ALPRAZolam (XANAX) 0.25 MG tablet TAKE 1 TABLET BY MOUTH 2 TIMES DAILY AS NEEDED FOR ANXIETY.   bisoprolol (ZEBETA) 10 MG tablet Take 1.5 tablets (15 mg total) by mouth 2 (two) times daily.   busPIRone (BUSPAR) 5 MG tablet TAKE 1 TABLET BY MOUTH THREE TIMES A DAY AS NEEDED   Cholecalciferol (VITAMIN D3) 5000 units CAPS Take 5,000 Units by mouth daily.   clotrimazole (LOTRIMIN) 1 % cream Apply 1 application topically 2 (two) times daily.   colchicine 0.6 MG tablet Take 1 tablet (0.6 mg total) by mouth 2 (two) times daily for 9 days.   diphenhydrAMINE (BENADRYL) 25 mg capsule Take 1 capsule (25 mg total) by mouth every 8 (eight) hours as needed for itching or allergies.   ELIQUIS 5 MG TABS tablet TAKE 1 TABLET BY MOUTH TWICE A DAY   escitalopram (LEXAPRO) 10 MG tablet Take 1 tablet (10 mg total) by mouth daily.   flecainide (TAMBOCOR) 100 MG tablet Take 1 tablet (100 mg total) by mouth 2 (two) times daily.   furosemide (LASIX) 20 MG tablet Take 1 tablet (20 mg total) by mouth daily.   Multiple Vitamin (MULTIVITAMIN) tablet Take 1 tablet by mouth daily.   nystatin cream (MYCOSTATIN) Apply 1 application. topically 2 (two) times daily. Prn abdomen (Patient taking differently: Apply 1 application  topically 2 (two) times daily as needed for dry skin. Prn abdomen)   ondansetron (ZOFRAN) 8 MG tablet  Take 1 tablet (8 mg total) by mouth every 8 (eight) hours as needed.   pantoprazole (PROTONIX) 40 MG tablet Take 1 tablet (40 mg total) by mouth daily.   potassium chloride SA (KLOR-CON M) 20 MEQ tablet Take 1 tablet (20 mEq total) by mouth daily.   ranolazine (RANEXA) 500 MG 12 hr tablet TAKE 1 TABLET BY MOUTH TWICE A DAY   rOPINIRole (REQUIP) 2 MG tablet TAKE 1 TABLET BY MOUTH EVERYDAY AT BEDTIME   rosuvastatin (CRESTOR) 40 MG tablet Take 1 tablet (40 mg total) by mouth daily.   colchicine 0.6 MG tablet Take 1 tablet (0.6 mg total) by mouth 2 (two) times daily for 5 days.   FLUoxetine (PROZAC) 20 MG capsule  TAKE 1 CAPSULE BY MOUTH EVERY DAY IN THE MORNING (Patient not taking: Reported on 12/13/2022)   ibuprofen (ADVIL) 600 MG tablet Take 1 tablet (600 mg total) by mouth 2 (two) times daily as needed (chest pain). (Patient not taking: Reported on 12/20/2022)   meclizine (ANTIVERT) 12.5 MG tablet Take 1 tablet (12.5 mg total) by mouth 3 (three) times daily as needed for dizziness. (Patient not taking: Reported on 12/13/2022)   promethazine (PHENERGAN) 25 MG tablet Take 1 tablet (25 mg total) by mouth every 6 (six) hours as needed for nausea or vomiting. (Patient not taking: Reported on 12/13/2022)   rizatriptan (MAXALT) 10 MG tablet TAKE 1 TABLET (10 MG TOTAL) BY MOUTH AS NEEDED FOR MIGRAINE. MAY REPEAT IN 2 HOURS IF NEEDED (Patient not taking: Reported on 12/13/2022)   rOPINIRole (REQUIP) 0.5 MG tablet Take 1 tablet (0.5 mg total) by mouth at bedtime.   [DISCONTINUED] prochlorperazine (COMPAZINE) 10 MG tablet Take 1 tablet (10 mg total) by mouth every 6 (six) hours as needed (Nausea or vomiting).   No facility-administered encounter medications on file as of 12/20/2022.    Allergies (verified) Prednisone, Amiodarone, Hydrocodone-acetaminophen, Oxycodone, Tapentadol, Tramadol, Adhesive [tape], and Latex   History: Past Medical History:  Diagnosis Date   Achilles tendinitis    Acute medial meniscal tear    Allergy    Anxiety    Atrial fibrillation (HCC)    a. new onset 07/2016; b. CHADS2VASc --> 4 (HTN, female, age, CHF)-->eliquis; c. 12/2022 s/p PVI.   Chemotherapy induced nausea and vomiting 05/18/2021   Chronic anticoagulation    Apixaban   Chronic heart failure with mid-range ejection fraction (HFmrEF) (HCC)    a. 10/2022 Echo: EF 45-50%, glob HK, mod reduced RV fxn, RVSP , mildly dil LA, mild MR, mild-mod TR. Asc Ao 39mm.   Chronic lower back pain    CKD (chronic kidney disease), stage II    Colon polyp    Complication of anesthesia    Emergence delirium; hypoxia   COPD (chronic obstructive  pulmonary disease) (HCC)    Cystic breast    Depression    Endometriosis    Fibrocystic breast disease    GERD (gastroesophageal reflux disease)    History of stress test    a. 07/2016 MV: EF 55-65%. Small, mild defect  in mid anteroseptal, apical septal, and apical locations. No ischemia-->low risk; b. 10/2022 MV: mod sized partially rev apical ant, apical septal, and apical defect. EF 52%. No signific cor Ca2+; b. 11/2022 Cardiac CT: Ca2+ = 31.6 (58th%'ile).   Hyperlipidemia    Hypertension    Insomnia    Lipoma    Malignant neoplasm of upper-outer quadrant of right female breast (HCC)    Stage 1A invasive mammary carcinoma (cT1b or  T1c N0); ER (-), PR (+), HER2/neu (+)   Migraine    Mild Mitral regurgitation    a. 07/2016 Echo: Mild MR; b. 10/2022 Echo: Mild MR.   Nephrolithiasis    OSA (obstructive sleep apnea)    non-compliant with nocturnal PAP therapy   Osteoarthritis    left knee   Osteopenia    Peptic ulcer disease    a. H. pylori   Pneumonia    RLS (restless legs syndrome)    Tendonitis    Thyroid disease    Tricuspid regurgitation    a. 10/2022 Echo: mild-mod TR.   Vitamin D deficiency    Past Surgical History:  Procedure Laterality Date   ABDOMINAL HYSTERECTOMY     ovaries left   ACHILLES TENDON SURGERY     2012,01/2017   ATRIAL FIBRILLATION ABLATION N/A 12/12/2022   Procedure: ATRIAL FIBRILLATION ABLATION;  Surgeon: Lanier Prude, MD;  Location: [facility];  Service: Cardiovascular;  Laterality: N/A;   BACK SURGERY     fusion lumbar   BREAST BIOPSY Right 01/15/2021   Affirm biopsy-"X" clip-INVASIVE MAMMARY CARCINOMA   BREAST BIOPSY Right 01/15/2021   u/s bx-"venus" clip INVASIVE MAMMARY CARCINOMA   BREAST BIOPSY     BREAST LUMPECTOMY,RADIO FREQ LOCALIZER,AXILLARY SENTINEL LYMPH NODE BIOPSY Right 02/17/2021   Procedure: BREAST LUMPECTOMY,RADIO FREQ LOCALIZER,AXILLARY SENTINEL LYMPH NODE BIOPSY;  Surgeon: Campbell Lerner, MD;  Location: [facility];   Service: General;  Laterality: Right;   CARDIOVERSION N/A 09/07/2016   Procedure: CARDIOVERSION;  Surgeon: Antonieta Iba, MD;  Location: [facility];  Service: Cardiovascular;  Laterality: N/A;   CARDIOVERSION N/A 10/19/2016   Procedure: CARDIOVERSION;  Surgeon: Antonieta Iba, MD;  Location: [facility];  Service: Cardiovascular;  Laterality: N/A;   CARDIOVERSION N/A 12/14/2016   Procedure: CARDIOVERSION;  Surgeon: Antonieta Iba, MD;  Location: [facility];  Service: Cardiovascular;  Laterality: N/A;   COLONOSCOPY     COLONOSCOPY WITH PROPOFOL N/A 05/13/2020   Procedure: COLONOSCOPY WITH PROPOFOL;  Surgeon: Toledo, Boykin Nearing, MD;  Location: [facility];  Service: Gastroenterology;  Laterality: N/A;  ELIQUIS   CYSTOSCOPY WITH STENT PLACEMENT Bilateral 07/12/2021   Procedure: CYSTOSCOPY WITH STENT PLACEMENT;  Surgeon: Sondra Come, MD;  Location: [facility];  Service: Urology;  Laterality: Bilateral;   CYSTOSCOPY/URETEROSCOPY/HOLMIUM LASER/STENT PLACEMENT Bilateral 07/30/2021   Procedure: CYSTOSCOPY/URETEROSCOPY/HOLMIUM LASER/BILATERAL STENT REMOVAL;  Surgeon: Sondra Come, MD;  Location: [facility];  Service: Urology;  Laterality: Bilateral;   ESOPHAGOGASTRODUODENOSCOPY     ESOPHAGOGASTRODUODENOSCOPY (EGD) WITH PROPOFOL N/A 05/13/2020   Procedure: ESOPHAGOGASTRODUODENOSCOPY (EGD) WITH PROPOFOL;  Surgeon: Toledo, Boykin Nearing, MD;  Location: [facility];  Service: Gastroenterology;  Laterality: N/A;   FOOT SURGERY Left    tendon   JOINT REPLACEMENT Right    hip   PORTA CATH INSERTION N/A 03/23/2021   Procedure: PORTA CATH INSERTION;  Surgeon: Renford Dills, MD;  Location: [facility];  Service: Cardiovascular;  Laterality: N/A;   PORTACATH PLACEMENT N/A 03/23/2021   Procedure: ATTEMPTED INSERTION PORT-A-CATH;  Surgeon: Campbell Lerner, MD;  Location: [facility];  Service: General;  Laterality: N/A;   THYROIDECTOMY  1990   half thyroid lobectomy secondary to a benign  nodule   TOTAL HIP ARTHROPLASTY Right 08/21/2018   Procedure: TOTAL HIP ARTHROPLASTY ANTERIOR APPROACH-RIGHT CELL SAVER REQUESTED;  Surgeon: Kennedy Bucker, MD;  Location: [facility];  Service: Orthopedics;  Laterality: Right;   Family History  Problem Relation Age of Onset   Cancer Mother  leukemia   Cancer Father 64       colon   Heart disease Father    Heart attack Father 62       died from MI   Hyperlipidemia Father    Hypertension Father    Cancer Sister 53       ovarian   Depression Sister    Cancer Brother        CLL   Breast cancer Paternal Aunt        65's   Migraines Neg Hx    Thyroid cancer Neg Hx    Social History   Socioeconomic History   Marital status: Married    Spouse name: jeffrey   Number of children: 1   Years of education: Not on file   Highest education level: 12th grade  Occupational History    Comment: disabled  Tobacco Use   Smoking status: Former    Packs/day: 0.25    Years: 10.00    Additional pack years: 0.00    Total pack years: 2.50    Types: Cigarettes    Quit date: 2005    Years since quitting: 19.4   Smokeless tobacco: Never   Tobacco comments:    smoked for 10 years, 1 pack every 2-3 days  Vaping Use   Vaping Use: Never used  Substance and Sexual Activity   Alcohol use: Not Currently    Alcohol/week: 1.0 standard drink of alcohol    Types: 1 Glasses of wine per week    Comment: rarely   Drug use: No   Sexual activity: Not Currently  Other Topics Concern   Not on file  Social History Narrative   Moved to [place] from [place], originally from [place].    1 cup of caffeine daily   Right handed   Lives with husband, step-daughter and child       Social Determinants of Health   Financial Resource Strain: Low Risk  (12/20/2022)   Overall Financial Resource Strain (CARDIA)    Difficulty of Paying Living Expenses: Not hard at all  Food Insecurity: No Food Insecurity (12/20/2022)   Hunger Vital Sign     Worried About Running Out of Food in the Last Year: Never true    Ran Out of Food in the Last Year: Never true  Transportation Needs: No Transportation Needs (12/20/2022)   PRAPARE - Administrator, Civil Service (Medical): No    Lack of Transportation (Non-Medical): No  Physical Activity: Inactive (12/20/2022)   Exercise Vital Sign    Days of Exercise per Week: 0 days    Minutes of Exercise per Session: 0 min  Stress: Stress Concern Present (12/20/2022)   [employer] of Occupational Health - Occupational Stress Questionnaire    Feeling of Stress : To some extent  Social Connections: Moderately Integrated (12/20/2022)   Social Connection and Isolation Panel [NHANES]    Frequency of Communication with Friends and Family: More than three times a week    Frequency of Social Gatherings with Friends and Family: Once a week    Attends Religious Services: More than 4 times per year    Active Member of [employer] or Organizations: No    Attends Banker Meetings: Never    Marital Status: Married    Tobacco Counseling Counseling given: Not Answered Tobacco comments: smoked for 10 years, 1 pack every 2-3 days   Clinical Intake:  Pre-visit preparation completed: Yes  Pain : 0-10 Pain Score:  4  Pain Location: Abdomen Pain Radiating Towards: had surgery last week     Nutritional Risks: None Diabetes: No  How often do you need to have someone help you when you read instructions, pamphlets, or other written materials from your doctor or pharmacy?: 1 - Never  Diabetic?no  Interpreter Needed?: No  Information entered by :: Kennedy Bucker, LPN   Activities of Daily Living    12/20/2022    9:41 AM 12/19/2022    8:45 AM  In your present state of health, do you have any difficulty performing the following activities:  Hearing? 0 0  Vision? 0 0  Difficulty concentrating or making decisions? 0 1  Walking or climbing stairs? 1 1  Dressing or bathing? 0 0   Doing errands, shopping? 0 1  Preparing Food and eating ? N Y  Using the Toilet? N N  In the past six months, have you accidently leaked urine? N Y  Do you have problems with loss of bowel control? N Y  Managing your Medications? N N  Managing your Finances? N N  Housekeeping or managing your Housekeeping? N Y    Patient Care Team: Allegra Grana, FNP as PCP - General (Family Medicine) Mariah Milling Tollie Pizza, MD as PCP - Cardiology (Cardiology) Lanier Prude, MD as PCP - Electrophysiology (Cardiology) Antonieta Iba, MD as Consulting Physician (Cardiology) Jim Like, RN as Oncology Nurse Navigator [name], MD as Consulting Physician (Hematology and Oncology) Sondra Come, MD as Consulting Physician (Urology) Carmina Miller, MD as Consulting Physician (Radiation Oncology) Campbell Lerner, MD as Consulting Physician (General Surgery)  Indicate any recent Medical Services you may have received from other than [facility] providers in the past year (date may be approximate).     Assessment:   This is a routine wellness examination for [place].  Hearing/Vision screen Hearing Screening - Comments:: No aids Vision Screening - Comments:: Wears glasses- Prudencio Burly  Dietary issues and exercise activities discussed: Current Exercise Habits: The patient does not participate in regular exercise at present   Goals Addressed             This Visit's Progress    DIET - EAT MORE FRUITS AND VEGETABLES         Depression Screen    12/20/2022    9:38 AM 12/13/2022    9:08 AM 12/13/2022    9:07 AM 11/09/2022    4:11 PM 09/12/2022    8:20 AM 08/10/2022    9:26 AM 07/28/2022   11:26 AM  PHQ 2/9 Scores  PHQ - 2 Score 2 0 0 0 4 2 0  PHQ- 9 Score 5 0  1 16 7 8     Fall Risk    12/20/2022    9:41 AM 12/19/2022    8:45 AM 12/13/2022    9:09 AM 11/09/2022    4:11 PM 09/12/2022    8:19 AM  Fall Risk   Falls in the past year? 1 1 0 0 1  Number falls in past yr: 1 1 0 0 1   Injury with Fall? 0 0 0 0 0  Risk for fall due to : History of fall(s)  No Fall Risks No Fall Risks History of fall(s)  Follow up Falls evaluation completed;Falls prevention discussed  Falls evaluation completed Falls evaluation completed Falls evaluation completed    FALL RISK PREVENTION PERTAINING TO THE HOME:  Any stairs in or around the home? No  If so, are there any  without handrails? Yes  Home free of loose throw rugs in walkways, pet beds, electrical cords, etc? Yes  Adequate lighting in your home to reduce risk of falls? Yes   ASSISTIVE DEVICES UTILIZED TO PREVENT FALLS:  Life alert? No  Use of a cane, walker or w/c? Yes - cane Grab bars in the bathroom? Yes  Shower chair or bench in shower? Yes  Elevated toilet seat or a handicapped toilet? Yes    Cognitive Function:        12/20/2022    9:45 AM 11/27/2019   11:53 AM  6CIT Screen  What Year? 0 points 0 points  What month? 0 points 0 points  What time? 0 points   Count back from 20 0 points   Months in reverse 0 points 0 points  Repeat phrase 0 points 0 points  Total Score 0 points     Immunizations Immunization History  Administered Date(s) Administered   Fluad Quad(high Dose 65+) 04/08/2019, 03/30/2020, 03/30/2022   Influenza Inj Mdck Quad Pf 04/03/2017   Influenza, High Dose Seasonal PF 03/05/2021   Influenza,inj,Quad PF,6+ Mos 04/14/2016, 03/30/2018   Influenza-Unspecified 03/07/2019   PFIZER Comirnaty(Gray Top)Covid-19 Tri-Sucrose Vaccine 01/06/2021, 04/07/2022   PFIZER(Purple Top)SARS-COV-2 Vaccination 08/23/2019, 09/18/2019, 06/02/2020   PNEUMOCOCCAL CONJUGATE-20 01/26/2021   Pfizer Covid-19 Vaccine Bivalent Booster 84yrs & up 04/07/2022   Pneumococcal Conjugate-13 03/30/2018   Pneumococcal Polysaccharide-23 04/18/2019   Pneumococcal-Unspecified 03/23/2012   Td 11/01/2012   Tdap 03/05/2021   Zoster Recombinat (Shingrix) 01/06/2021, 03/05/2021   Zoster, Live 10/07/2012    TDAP status: Due,  Education has been provided regarding the importance of this vaccine. Advised may receive this vaccine at local pharmacy or Health Dept. Aware to provide a copy of the vaccination record if obtained from local pharmacy or Health Dept. Verbalized acceptance and understanding.  Flu Vaccine status: Up to date  Pneumococcal vaccine status: Up to date  Covid-19 vaccine status: Completed vaccines  Qualifies for Shingles Vaccine? Yes   Zostavax completed Yes   Shingrix Completed?: Yes  Screening Tests Health Maintenance  Topic Date Due   COVID-19 Vaccine (6 - 2023-24 season) 06/02/2022   INFLUENZA VACCINE  02/09/2023   Colonoscopy  05/14/2023   Medicare Annual Wellness (AWV)  12/20/2023   MAMMOGRAM  01/04/2024   DTaP/Tdap/Td (3 - Td or Tdap) 03/06/2031   Pneumonia Vaccine 72+ Years old  Completed   DEXA SCAN  Completed   Hepatitis C Screening  Completed   Zoster Vaccines- Shingrix  Completed   HPV VACCINES  Aged Out    Health Maintenance  Health Maintenance Due  Topic Date Due   COVID-19 Vaccine (6 - 2023-24 season) 06/02/2022    Colorectal cancer screening: Type of screening: Colonoscopy. Completed 05/13/20. Repeat every 3 years  Mammogram status: Completed SCHEDULED FOR 12/28/22. Repeat every year  Bone Density status: Completed 09/29/14. Results reflect: Bone density results: NORMAL. Repeat every 5 years.- DECLINED REFERRAL, WANTS TO DISCUSS W/ MD  Lung Cancer Screening: (Low Dose CT Chest recommended if Age 27-80 years, 30 pack-year currently smoking OR have quit w/in 15years.) does not qualify.    Additional Screening:  Hepatitis C Screening: does qualify; Completed 03/31/20  Vision Screening: Recommended annual ophthalmology exams for early detection of glaucoma and other disorders of the eye. Is the patient up to date with their annual eye exam?  Yes  Who is the provider or what is the name of the office in which the patient attends annual eye exams? [facility]  If  pt is not established with a provider, would they like to be referred to a provider to establish care? No .   Dental Screening: Recommended annual dental exams for proper oral hygiene  Community Resource Referral / Chronic Care Management: CRR required this visit?  No   CCM required this visit?  No      Plan:     I have personally reviewed and noted the following in the patient's chart:   Medical and social history Use of alcohol, tobacco or illicit drugs  Current medications and supplements including opioid prescriptions. Patient is not currently taking opioid prescriptions. Functional ability and status Nutritional status Physical activity Advanced directives List of other physicians Hospitalizations, surgeries, and ER visits in previous 12 months Vitals Screenings to include cognitive, depression, and falls Referrals and appointments  In addition, I have reviewed and discussed with patient certain preventive protocols, quality metrics, and best practice recommendations. A written personalized care plan for preventive services as well as general preventive health recommendations were provided to patient.     Hal Hope, LPN   12/06/4130   Nurse Notes: none

## 2022-12-20 NOTE — Patient Instructions (Addendum)
Medication Instructions:  DECREASE colchicine 0.6 mg by mouth to once daily for 7 days then STOP  *If you need a refill on your cardiac medications before your next appointment, please call your pharmacy*   Lab Work: No labs ordered  If you have labs (blood work) drawn today and your tests are completely normal, you will receive your results only by: MyChart Message (if you have MyChart) OR A paper copy in the mail If you have any lab test that is abnormal or we need to change your treatment, we will call you to review the results.   Testing/Procedures: No testing ordered  Follow-Up: At [facility], you and your health needs are our priority.  As part of our continuing mission to provide you with exceptional heart care, we have created designated Provider Care Teams.  These Care Teams include your primary Cardiologist (physician) and Advanced Practice Providers (APPs -  Physician Assistants and Nurse Practitioners) who all work together to provide you with the care you need, when you need it.  We recommend signing up for the patient portal called "MyChart".  Sign up information is provided on this After Visit Summary.  MyChart is used to connect with patients for Virtual Visits (Telemedicine).  Patients are able to view lab/test results, encounter notes, upcoming appointments, etc.  Non-urgent messages can be sent to your provider as well.   To learn more about what you can do with MyChart, go to [email or web address].    Your next appointment:    3 weeks  Provider:   Sherie Don, NP

## 2022-12-20 NOTE — Patient Instructions (Signed)
Amber Barnes , Thank you for taking time to come for your Medicare Wellness Visit. I appreciate your ongoing commitment to your health goals. Please review the following plan we discussed and let me know if I can assist you in the future.   These are the goals we discussed:  Goals       DIET - EAT MORE FRUITS AND VEGETABLES      Weight loss goal 20lb (pt-stated)      Portion control meals Increase physical activity with stationary bike as tolerated Walking water aerobics Add 2lb strength training weights        This is a list of the screening recommended for you and due dates:  Health Maintenance  Topic Date Due   COVID-19 Vaccine (6 - 2023-24 season) 06/02/2022   Flu Shot  02/09/2023   Colon Cancer Screening  05/14/2023   Medicare Annual Wellness Visit  12/20/2023   Mammogram  01/04/2024   DTaP/Tdap/Td vaccine (3 - Td or Tdap) 03/06/2031   Pneumonia Vaccine  Completed   DEXA scan (bone density measurement)  Completed   Hepatitis C Screening  Completed   Zoster (Shingles) Vaccine  Completed   HPV Vaccine  Aged Out    Advanced directives: no  Conditions/risks identified: none  Next appointment: Follow up in one year for your annual wellness visit 12/22/23 @ 9:15 am by phone   Preventive Care 65 Years and Older, Female Preventive care refers to lifestyle choices and visits with your health care provider that can promote health and wellness. What does preventive care include? A yearly physical exam. This is also called an annual well check. Dental exams once or twice a year. Routine eye exams. Ask your health care provider how often you should have your eyes checked. Personal lifestyle choices, including: Daily care of your teeth and gums. Regular physical activity. Eating a healthy diet. Avoiding tobacco and drug use. Limiting alcohol use. Practicing safe sex. Taking low-dose aspirin every day. Taking vitamin and mineral supplements as recommended by your health care  provider. What happens during an annual well check? The services and screenings done by your health care provider during your annual well check will depend on your age, overall health, lifestyle risk factors, and family history of disease. Counseling  Your health care provider may ask you questions about your: Alcohol use. Tobacco use. Drug use. Emotional well-being. Home and relationship well-being. Sexual activity. Eating habits. History of falls. Memory and ability to understand (cognition). Work and work Astronomer. Reproductive health. Screening  You may have the following tests or measurements: Height, weight, and BMI. Blood pressure. Lipid and cholesterol levels. These may be checked every 5 years, or more frequently if you are over 58 years old. Skin check. Lung cancer screening. You may have this screening every year starting at age 61 if you have a 30-pack-year history of smoking and currently smoke or have quit within the past 15 years. Fecal occult blood test (FOBT) of the stool. You may have this test every year starting at age 69. Flexible sigmoidoscopy or colonoscopy. You may have a sigmoidoscopy every 5 years or a colonoscopy every 10 years starting at age 36. Hepatitis C blood test. Hepatitis B blood test. Sexually transmitted disease (STD) testing. Diabetes screening. This is done by checking your blood sugar (glucose) after you have not eaten for a while (fasting). You may have this done every 1-3 years. Bone density scan. This is done to screen for osteoporosis. You may have this  done starting at age 69. Mammogram. This may be done every 1-2 years. Talk to your health care provider about how often you should have regular mammograms. Talk with your health care provider about your test results, treatment options, and if necessary, the need for more tests. Vaccines  Your health care provider may recommend certain vaccines, such as: Influenza vaccine. This is  recommended every year. Tetanus, diphtheria, and acellular pertussis (Tdap, Td) vaccine. You may need a Td booster every 10 years. Zoster vaccine. You may need this after age 76. Pneumococcal 13-valent conjugate (PCV13) vaccine. One dose is recommended after age 87. Pneumococcal polysaccharide (PPSV23) vaccine. One dose is recommended after age 58. Talk to your health care provider about which screenings and vaccines you need and how often you need them. This information is not intended to replace advice given to you by your health care provider. Make sure you discuss any questions you have with your health care provider. Document Released: 07/24/2015 Document Revised: 03/16/2016 Document Reviewed: 04/28/2015 Elsevier Interactive Patient Education  2017 [place] Prevention in the Home Falls can cause injuries. They can happen to people of all ages. There are many things you can do to make your home safe and to help prevent falls. What can I do on the outside of my home? Regularly fix the edges of walkways and driveways and fix any cracks. Remove anything that might make you trip as you walk through a door, such as a raised step or threshold. Trim any bushes or trees on the path to your home. Use bright outdoor lighting. Clear any walking paths of anything that might make someone trip, such as rocks or tools. Regularly check to see if handrails are loose or broken. Make sure that both sides of any steps have handrails. Any raised decks and porches should have guardrails on the edges. Have any leaves, snow, or ice cleared regularly. Use sand or salt on walking paths during winter. Clean up any spills in your garage right away. This includes oil or grease spills. What can I do in the bathroom? Use night lights. Install grab bars by the toilet and in the tub and shower. Do not use towel bars as grab bars. Use non-skid mats or decals in the tub or shower. If you need to sit down in  the shower, use a plastic, non-slip stool. Keep the floor dry. Clean up any water that spills on the floor as soon as it happens. Remove soap buildup in the tub or shower regularly. Attach bath mats securely with double-sided non-slip rug tape. Do not have throw rugs and other things on the floor that can make you trip. What can I do in the bedroom? Use night lights. Make sure that you have a light by your bed that is easy to reach. Do not use any sheets or blankets that are too big for your bed. They should not hang down onto the floor. Have a firm chair that has side arms. You can use this for support while you get dressed. Do not have throw rugs and other things on the floor that can make you trip. What can I do in the kitchen? Clean up any spills right away. Avoid walking on wet floors. Keep items that you use a lot in easy-to-reach places. If you need to reach something above you, use a strong step stool that has a grab bar. Keep electrical cords out of the way. Do not use floor polish or wax  that makes floors slippery. If you must use wax, use non-skid floor wax. Do not have throw rugs and other things on the floor that can make you trip. What can I do with my stairs? Do not leave any items on the stairs. Make sure that there are handrails on both sides of the stairs and use them. Fix handrails that are broken or loose. Make sure that handrails are as long as the stairways. Check any carpeting to make sure that it is firmly attached to the stairs. Fix any carpet that is loose or worn. Avoid having throw rugs at the top or bottom of the stairs. If you do have throw rugs, attach them to the floor with carpet tape. Make sure that you have a light switch at the top of the stairs and the bottom of the stairs. If you do not have them, ask someone to add them for you. What else can I do to help prevent falls? Wear shoes that: Do not have high heels. Have rubber bottoms. Are comfortable  and fit you well. Are closed at the toe. Do not wear sandals. If you use a stepladder: Make sure that it is fully opened. Do not climb a closed stepladder. Make sure that both sides of the stepladder are locked into place. Ask someone to hold it for you, if possible. Clearly mark and make sure that you can see: Any grab bars or handrails. First and last steps. Where the edge of each step is. Use tools that help you move around (mobility aids) if they are needed. These include: Canes. Walkers. Scooters. Crutches. Turn on the lights when you go into a dark area. Replace any light bulbs as soon as they burn out. Set up your furniture so you have a clear path. Avoid moving your furniture around. If any of your floors are uneven, fix them. If there are any pets around you, be aware of where they are. Review your medicines with your doctor. Some medicines can make you feel dizzy. This can increase your chance of falling. Ask your doctor what other things that you can do to help prevent falls. This information is not intended to replace advice given to you by your health care provider. Make sure you discuss any questions you have with your health care provider. Document Released: 04/23/2009 Document Revised: 12/03/2015 Document Reviewed: 08/01/2014 Elsevier Interactive Patient Education  2017 [employer].

## 2022-12-21 ENCOUNTER — Ambulatory Visit (HOSPITAL_COMMUNITY): Payer: Medicare Other | Admitting: Internal Medicine

## 2022-12-21 ENCOUNTER — Other Ambulatory Visit: Payer: Self-pay

## 2022-12-25 ENCOUNTER — Other Ambulatory Visit: Payer: Self-pay | Admitting: Family

## 2022-12-25 DIAGNOSIS — F419 Anxiety disorder, unspecified: Secondary | ICD-10-CM

## 2022-12-28 ENCOUNTER — Ambulatory Visit
Admit: 2022-12-28 | Discharge: 2022-12-28 | Disposition: A | Discharge status: Home / Self Care | Payer: Medicare Other | Attending: Oncology | Admitting: Oncology

## 2022-12-28 ENCOUNTER — Ambulatory Visit
Admission: RE | Admit: 2022-12-28 | Discharge: 2022-12-28 | Disposition: A | Discharge status: Home / Self Care | Payer: Medicare Other | Source: Ambulatory Visit | Attending: Oncology | Admitting: Oncology

## 2022-12-28 DIAGNOSIS — C50411 Malignant neoplasm of upper-outer quadrant of right female breast: Secondary | ICD-10-CM

## 2022-12-28 DIAGNOSIS — Z853 Personal history of malignant neoplasm of breast: Secondary | ICD-10-CM | POA: Diagnosis not present

## 2022-12-28 DIAGNOSIS — R92323 Mammographic fibroglandular density, bilateral breasts: Secondary | ICD-10-CM | POA: Diagnosis not present

## 2022-12-28 DIAGNOSIS — Z171 Estrogen receptor negative status [ER-]: Secondary | ICD-10-CM | POA: Insufficient documentation

## 2022-12-28 DIAGNOSIS — N6312 Unspecified lump in the right breast, upper inner quadrant: Secondary | ICD-10-CM | POA: Diagnosis not present

## 2022-12-29 ENCOUNTER — Other Ambulatory Visit: Payer: Self-pay | Admitting: Family

## 2022-12-29 DIAGNOSIS — N63 Unspecified lump in unspecified breast: Secondary | ICD-10-CM

## 2022-12-29 DIAGNOSIS — R928 Other abnormal and inconclusive findings on diagnostic imaging of breast: Secondary | ICD-10-CM

## 2023-01-03 ENCOUNTER — Ambulatory Visit (HOSPITAL_COMMUNITY): Payer: Medicare Other | Admitting: Internal Medicine

## 2023-01-04 ENCOUNTER — Ambulatory Visit
Admission: RE | Admit: 2023-01-04 | Discharge: 2023-01-04 | Disposition: A | Discharge status: Home / Self Care | Payer: Medicare Other | Source: Ambulatory Visit | Attending: Family | Admitting: Family

## 2023-01-04 DIAGNOSIS — N63 Unspecified lump in unspecified breast: Secondary | ICD-10-CM | POA: Insufficient documentation

## 2023-01-04 DIAGNOSIS — N6312 Unspecified lump in the right breast, upper inner quadrant: Secondary | ICD-10-CM | POA: Diagnosis not present

## 2023-01-04 DIAGNOSIS — R928 Other abnormal and inconclusive findings on diagnostic imaging of breast: Secondary | ICD-10-CM | POA: Diagnosis not present

## 2023-01-04 DIAGNOSIS — N641 Fat necrosis of breast: Secondary | ICD-10-CM | POA: Diagnosis not present

## 2023-01-04 HISTORY — PX: BREAST BIOPSY: SHX20

## 2023-01-04 MED ORDER — LIDOCAINE-EPINEPHRINE 1 %-1:100000 IJ SOLN
8.0000 mL | Freq: Once | INTRAMUSCULAR | Status: AC
  Administered 2023-01-04: 8 mL
  Filled 2023-01-04: qty 8

## 2023-01-04 MED ORDER — LIDOCAINE 1 % OPTIME INJ - NO CHARGE
5.0000 mL | Freq: Once | INTRAMUSCULAR | Status: AC
  Administered 2023-01-04: 5 mL via INTRADERMAL
  Filled 2023-01-04: qty 6

## 2023-01-06 ENCOUNTER — Other Ambulatory Visit: Payer: Medicare Other

## 2023-01-08 NOTE — Progress Notes (Unsigned)
Cardiology Office Note Date:  01/09/2023  Patient ID:  Amber Barnes, Amber Barnes 05/09/1951, MRN [id number] PCP:  Allegra Grana, FNP  Cardiologist:  Julien Nordmann, MD Electrophysiologist: Lanier Prude, MD   Amber Barnes is a 72 y.o. female with a history of HTN, OSA, COPD, Breast Ca, atrial fibrillation who presents for follow-up after recent AF ablation. Following ablation, she presented to [facility] ER with increased chest pain with deep breathing, dx with pericarditis. Ibuprofen started, colchicine duration increased from 5d > 14days. I saw her in clinic 12/20/2022 and her chest pain was significantly improved, but she was c/o significant N/V/D. I lowered colchine to daily.   Patient is on eliquis 5mg  BID for a CHADS2VASC score of 5.  On follow up today, patient states that she feels much better.  No further nausea vomiting diarrhea, she has no chest pain.  She does not think she has had any A-fib episodes.  Her main symptom of A-fib prior to ablation was fatigue.  She has noticed her energy level has been a little bit higher since preablation.  She has been getting in the pool daily to pool walk.  She is limited by activity due to her left hip pain She had a right breast biopsy last week, has been told that it is a benign lesion.  She is compliant with anticoagulation and has not missed any doses.  She has no bleeding concerns.  There is some slight bruising on her right breast from last week's biopsy, but significantly less then she thought there would be  S/p Afib ablation by Dr. Lalla Brothers on 12/12/2022. She has not had any episodes of Afib since ablation. No chest pain, shortness of breath, or trouble swallowing. Leg sites healed without issue.    she has a BMI of Body mass index is 39.94 kg/m.Marland Kitchen Filed Weights   01/09/23 1100  Weight: 240 lb (108.9 kg)    Family History  Problem Relation Age of Onset   Cancer Mother        leukemia   Cancer Father 32       colon   Heart  disease Father    Heart attack Father 13       died from MI   Hyperlipidemia Father    Hypertension Father    Cancer Sister 35       ovarian   Depression Sister    Cancer Brother        CLL   Breast cancer Paternal Aunt        40's   Migraines Neg Hx    Thyroid cancer Neg Hx     Atrial Fibrillation Management history:  Previous antiarrhythmic drugs: amiodarone, stopped d/t GI upset; flecainide currently Previous cardioversions: 08/2016, 10/2016, 12/2016 Previous ablations: 12/2022 Anticoagulation history: eliquis   Past Medical History:  Diagnosis Date   Achilles tendinitis    Acute medial meniscal tear    Allergy    Anxiety    Atrial fibrillation (HCC)    a. new onset 07/2016; b. CHADS2VASc --> 4 (HTN, female, age, CHF)-->eliquis; c. 12/2022 s/p PVI.   Chemotherapy induced nausea and vomiting 05/18/2021   Chronic anticoagulation    Apixaban   Chronic heart failure with mid-range ejection fraction (HFmrEF) (HCC)    a. 10/2022 Echo: EF 45-50%, glob HK, mod reduced RV fxn, RVSP , mildly dil LA, mild MR, mild-mod TR. Asc Ao 39mm.   Chronic lower back pain    CKD (chronic kidney disease), stage II  Colon polyp    Complication of anesthesia    Emergence delirium; hypoxia   COPD (chronic obstructive pulmonary disease) (HCC)    Cystic breast    Depression    Endometriosis    Fibrocystic breast disease    GERD (gastroesophageal reflux disease)    History of stress test    a. 07/2016 MV: EF 55-65%. Small, mild defect  in mid anteroseptal, apical septal, and apical locations. No ischemia-->low risk; b. 10/2022 MV: mod sized partially rev apical ant, apical septal, and apical defect. EF 52%. No signific cor Ca2+; b. 11/2022 Cardiac CT: Ca2+ = 31.6 (58th%'ile).   Hyperlipidemia    Hypertension    Insomnia    Lipoma    Malignant neoplasm of upper-outer quadrant of right female breast (HCC)    Stage 1A invasive mammary carcinoma (cT1b or T1c N0); ER (-), PR (+), HER2/neu (+)    Migraine    Mild Mitral regurgitation    a. 07/2016 Echo: Mild MR; b. 10/2022 Echo: Mild MR.   Nephrolithiasis    OSA (obstructive sleep apnea)    non-compliant with nocturnal PAP therapy   Osteoarthritis    left knee   Osteopenia    Peptic ulcer disease    a. H. pylori   Pneumonia    RLS (restless legs syndrome)    Tendonitis    Thyroid disease    Tricuspid regurgitation    a. 10/2022 Echo: mild-mod TR.   Vitamin D deficiency     Current Outpatient Medications  Medication Instructions   acetaminophen (TYLENOL) 500-1,000 mg, Oral, Every 6 hours PRN   ALPRAZolam (XANAX) 0.25 mg, Oral, 2 times daily PRN   bisoprolol (ZEBETA) 15 mg, Oral, 2 times daily   busPIRone (BUSPAR) 5 mg, Oral, 3 times daily PRN   diphenhydrAMINE (BENADRYL) 25 mg, Oral, Every 8 hours PRN   ELIQUIS 5 MG TABS tablet TAKE 1 TABLET BY MOUTH TWICE A DAY   escitalopram (LEXAPRO) 10 mg, Oral, Daily   flecainide (TAMBOCOR) 100 mg, Oral, 2 times daily   furosemide (LASIX) 20 mg, Oral, Daily   Multiple Vitamin (MULTIVITAMIN) tablet 1 tablet, Oral, Daily   nystatin cream (MYCOSTATIN) 1 application , Topical, 2 times daily, Prn abdomen   ondansetron (ZOFRAN) 8 mg, Oral, Every 8 hours PRN   pantoprazole (PROTONIX) 40 mg, Oral, Daily   potassium chloride SA (KLOR-CON M) 20 MEQ tablet 20 mEq, Oral, Daily   ranolazine (RANEXA) 500 MG 12 hr tablet TAKE 1 TABLET BY MOUTH TWICE A DAY   rOPINIRole (REQUIP) 2 MG tablet TAKE 1 TABLET BY MOUTH EVERYDAY AT BEDTIME   rOPINIRole (REQUIP) 0.5 mg, Oral, Daily at bedtime   rosuvastatin (CRESTOR) 40 mg, Oral, Daily   Vitamin D3 5,000 Units, Oral, Daily    Allergies  Allergen Reactions   Prednisone Other (See Comments)    Agitation, hyperactivity, polyphagia   Amiodarone Nausea And Vomiting and Other (See Comments)   Hydrocodone-Acetaminophen Other (See Comments)    Hallucinations and combative   Oxycodone Itching    Can take it with benadryl   Tapentadol Itching   Tramadol  Itching    Can take it with benadryl   Adhesive [Tape] Itching and Rash    Prefers paper tape   Latex Itching and Rash    use paper tap    Social History:  The patient  reports that she quit smoking about 19 years ago. Her smoking use included cigarettes. She has a 2.50 pack-year smoking history. She has  never used smokeless tobacco. She reports current alcohol use of about 1.0 standard drink of alcohol per week. She reports that she does not use drugs.   Family History:  The patient's family history includes Breast cancer in her paternal aunt; Cancer in her brother and mother; Cancer (age of onset: 53) in her sister; Cancer (age of onset: 66) in her father; Depression in her sister; Heart attack (age of onset: 22) in her father; Heart disease in her father; Hyperlipidemia in her father; Hypertension in her father.  ROS:  Please see the history of present illness. All other systems are reviewed and otherwise negative.   PHYSICAL EXAM:  Vitals:   01/09/23 1100 01/09/23 1130  BP: (!) 150/90 (!) 150/78  Pulse: (!) 54   Height: 5\' 5"  (1.651 m)   Weight: 240 lb (108.9 kg)   SpO2: 97%   BMI (Calculated): 39.94      GEN- The patient is well appearing, alert and oriented x 3 today.   Lungs- Clear to ausculation bilaterally, normal work of breathing.  Heart- Regular rate and rhythm, no murmurs, rubs or gallops Extremities- No peripheral edema, warm, dry   EKG is ordered. Personal review of EKG from today shows:  EKG Interpretation Date/Time:  Monday January 09 2023 10:57:49 EDT Ventricular Rate:  54 PR Interval:  228 QRS Duration:  96 QT Interval:  468 QTC Calculation: 443 R Axis:   -12  Text Interpretation: Sinus bradycardia with 1st degree A-V block Confirmed by Sherie Don [phone]) on 01/09/2023 11:05:04 AM    Recent Labs: 09/12/2022: TSH 3.40 10/31/2022: ALT 13 12/13/2022: B Natriuretic Peptide 76.9 12/14/2022: BUN 14; Creatinine, Ser 0.77; Hemoglobin 11.0; Platelets PLATELET CLUMPS  NOTED ON SMEAR, UNABLE TO ESTIMATE; Potassium 3.4; Sodium 137  12/14/2022: Cholesterol 101; HDL 48; LDL Cholesterol 43; Total CHOL/HDL Ratio 2.1; Triglycerides 51; VLDL 10   CrCl cannot be calculated (Patient's most recent lab result is older than the maximum 21 days allowed.).   Wt Readings from Last 3 Encounters:  01/09/23 240 lb (108.9 kg)  12/20/22 240 lb (108.9 kg)  12/20/22 243 lb (110.2 kg)     Additional studies reviewed include: Previous EP, cardiology notes.   TTE, 12/13/2022  1. Left ventricular ejection fraction, by estimation, is 50 to 55%. The left ventricle has low normal function. The left ventricle has no regional wall motion abnormalities. There is mild left ventricular hypertrophy.   2. Moderate mitral annular calcification.   3. The inferior vena cava is dilated in size with <50% respiratory variability, suggesting right atrial pressure of 15 mmHg.    AF ablation, 12/12/2022 1.  Successful PVI 2.  Successful ablation/isolation of the posterior wall 3.  Successful ablation of the cavotricuspid isthmus for typical atrial flutter 4.  Intracardiac echo reveals trivial pericardial effusion, normal left atrial architecture 5.  No early apparent complications. 6.  Colchicine 0.6 mg by mouth twice daily for 5 days 7.  Protonix 40 mg by mouth once daily for 45 days  TTE, 10/20/2022  1. Left ventricular ejection fraction, by estimation, is 45 to 50%. Left ventricular ejection fraction by 3D volume is 44 %. The left ventricle has mildly decreased function. The left ventricle demonstrates global hypokinesis. Left ventricular diastolic  parameters are indeterminate. The average left ventricular global longitudinal strain is -8.0 %.   2. Right ventricular systolic function is moderately reduced. The right ventricular size is normal. There is normal pulmonary artery systolic pressure. The estimated right ventricular systolic pressure is  28.0 mmHg.   3. Left atrial size was mildly  dilated.   4. The mitral valve is normal in structure. Mild mitral valve regurgitation. No evidence of mitral stenosis.   5. Tricuspid valve regurgitation is mild to moderate.   6. The aortic valve has an indeterminant number of cusps. Aortic valve regurgitation is not visualized. No aortic stenosis is present.   7. There is borderline dilatation of the ascending aorta, measuring 39 mm.   8. The inferior vena cava is normal in size with greater than 50% respiratory variability, suggesting right atrial pressure of 3 mmHg.   ASSESSMENT AND PLAN:  #) CHA2DS2-VASc Score = 5  The patient's score is based upon: CHF History: 1 HTN History: 1 Diabetes History: 1 Stroke History: 0 Vascular Disease History: 0 Age Score: 1 Gender Score: 1     ASSESSMENT AND PLAN:  #) Pericarditis s/p AF ablation, resolved S/p AF ablation 6/3 Chest pain and pressure post-procedurally, has since resolved Echo reassuring without effusion   #) persis AFib S/p ablation Groin sites well-healed No AF episodes since procedure Cont flecainide + bisoprolol EKG today with stable intervals, first-degree block unchanged   #) Hypercoag d/t AFib CHA2DS2-VASc Score = 5 [CHF History: 1, HTN History: 1, Diabetes History: 1, Stroke History: 0, Vascular Disease History: 0, Age Score: 1, Gender Score: 1].  Therefore, the patient's annual risk of stroke is 7.2 %.    NOAC - eliquis 5mg  BID, appropriately dosed No bleeding concerns  #) Obesity Body mass index is 39.94 kg/m. Lifestyle modification was discussed at length including regular exercise and weight reduction. Continue pool walking  #) HTN Elevated today on recheck  patient states that she had dietary indiscretion yesterday and just took her morning meds prior to appointment Home readings typically 120s/70s Continue to monitor, recommended that she check her blood pressure 3-4 times per week and notify office if measurements continue to be elevated   Current  medicines are reviewed at length with the patient today.   The patient does not have concerns regarding her medicines.  The following changes were made today:  none  Labs/ tests ordered today include:  Orders Placed This Encounter  Procedures   EKG 12-Lead     Disposition: Follow up with Dr. Lalla Brothers in as usual post procedure   Signed, Sherie Don, NP  01/09/23  12:10 PM  Electrophysiology [facility]

## 2023-01-09 ENCOUNTER — Encounter: Payer: Self-pay | Admitting: Cardiology

## 2023-01-09 ENCOUNTER — Ambulatory Visit (HOSPITAL_COMMUNITY): Payer: Medicare Other | Admitting: Internal Medicine

## 2023-01-09 ENCOUNTER — Ambulatory Visit: Payer: Medicare Other | Attending: Cardiology | Admitting: Cardiology

## 2023-01-09 VITALS — BP 150/78 | HR 54 | Ht 65.0 in | Wt 240.0 lb

## 2023-01-09 DIAGNOSIS — I10 Essential (primary) hypertension: Secondary | ICD-10-CM

## 2023-01-09 DIAGNOSIS — I309 Acute pericarditis, unspecified: Secondary | ICD-10-CM | POA: Diagnosis not present

## 2023-01-09 DIAGNOSIS — D6869 Other thrombophilia: Secondary | ICD-10-CM

## 2023-01-09 DIAGNOSIS — I4819 Other persistent atrial fibrillation: Secondary | ICD-10-CM

## 2023-01-09 NOTE — Patient Instructions (Signed)
Medication Instructions:  Your physician recommends that you continue on your current medications as directed. Please refer to the Current Medication list given to you today.  *If you need a refill on your cardiac medications before your next appointment, please call your pharmacy*   Lab Work: No labs ordered  If you have labs (blood work) drawn today and your tests are completely normal, you will receive your results only by: MyChart Message (if you have MyChart) OR A paper copy in the mail If you have any lab test that is abnormal or we need to change your treatment, we will call you to review the results.   Testing/Procedures: No testing ordered   Follow-Up: At [facility], you and your health needs are our priority.  As part of our continuing mission to provide you with exceptional heart care, we have created designated Provider Care Teams.  These Care Teams include your primary Cardiologist (physician) and Advanced Practice Providers (APPs -  Physician Assistants and Nurse Practitioners) who all work together to provide you with the care you need, when you need it.  We recommend signing up for the patient portal called "MyChart".  Sign up information is provided on this After Visit Summary.  MyChart is used to connect with patients for Virtual Visits (Telemedicine).  Patients are able to view lab/test results, encounter notes, upcoming appointments, etc.  Non-urgent messages can be sent to your provider as well.   To learn more about what you can do with MyChart, go to [email or web address].    Your next appointment:   03/15/23 @ 11:40 AM  Provider:   Steffanie Dunn, MD

## 2023-01-11 ENCOUNTER — Inpatient Hospital Stay: Payer: Medicare Other

## 2023-01-16 ENCOUNTER — Telehealth: Payer: Self-pay | Admitting: Family

## 2023-01-16 NOTE — Telephone Encounter (Signed)
Patient tested positive for COVID this morning. She is requesting medication, but wanted to know by Arnett if she can take COVID medication with all her issues. No appointment available with Arnett and patient really wanted to speak to Arnett about taking medications.

## 2023-01-17 ENCOUNTER — Telehealth (INDEPENDENT_AMBULATORY_CARE_PROVIDER_SITE_OTHER): Payer: Medicare Other | Admitting: Family

## 2023-01-17 ENCOUNTER — Encounter: Payer: Self-pay | Admitting: Family

## 2023-01-17 DIAGNOSIS — U071 COVID-19: Secondary | ICD-10-CM

## 2023-01-17 NOTE — Patient Instructions (Addendum)
May start claritin ( anti histamine)  over the counter   Flonase is also for allergies, thin nasal congestion, it is a nasal steroid.   For cough or thicker congestion, you may use mucinex -DM which is an expectorant  and cough suppressant.   Paxlovid has very strong interactions with flecainide and eliquis.    Please let me know how you are doing.   COVID-19 COVID-19 is an infection caused by a virus called SARS-CoV-2. This type of virus is called a coronavirus. People with COVID-19 may: Have little to no symptoms. Have mild to moderate symptoms that affect their lungs and breathing. Get very sick. What are the causes? COVID-19 is caused by a virus. This virus may be in the air as droplets or on surfaces. It can spread from an infected person when they cough, sneeze, speak, sing, or breathe. You may become infected if: You breathe in the infected droplets in the air. You touch an object that has the virus on it. What increases the risk? You are at risk of getting COVID-19 if you have been around someone with the infection. You may be more likely to get very sick if: You are 70 years old or older. You have certain medical conditions, such as: Heart disease. Diabetes. Chronic respiratory disease. Cancer. Pregnancy. You are immunocompromised. This means your body cannot fight infections easily. You have a disability or trouble moving, meaning you're immobile. What are the signs or symptoms? People may have different symptoms from COVID-19. The symptoms can also be mild to severe. They often show up in 5-6 days after being infected. But they can take up to 14 days to appear. Common symptoms are: Cough. Feeling tired. New loss of taste or smell. Fever. Less common symptoms are: Sore throat. Headache. Body or muscle aches. Diarrhea. A skin rash or odd-colored fingers or toes. Red or irritated eyes. Sometimes, COVID-19 does not cause symptoms. How is this diagnosed? COVID-19  can be diagnosed with tests done in the lab or at home. Fluid from your nose, mouth, or lungs will be used to check for the virus. How is this treated? Treatment for COVID-19 depends on how sick you are. Mild symptoms can be treated at home with rest, fluids, and over-the-counter medicines. Severe symptoms may be treated in a hospital intensive care unit (ICU). If you have symptoms and are at risk of getting very sick, you may be given a medicine that fights viruses. This medicine is called an antiviral. How is this prevented? To protect yourself from COVID-19: Know your risk factors. Get vaccinated. If your body cannot fight infections easily, talk to your provider about treatment to help prevent COVID-19. Stay at least 1 meter away from others. Wear a well-fitted mask when: You can't stay at a distance from people. You're in a place with poor air flow. Try to be in open spaces with good air flow when in public. Wash your hands often or use an alcohol-based hand sanitizer. Cover your nose and mouth when coughing and sneezing. If you think you have COVID-19 or have been around someone who has it, stay home and be by yourself for 5-10 days. Where to find more information Centers for Disease Control and Prevention (CDC): TonerPromos.no World Health Organization [facility]): VisitDestination.com.br Get help right away if: You have trouble breathing or get short of breath. You have pain or pressure in your chest. You cannot speak or move any part of your body. You are confused. Your symptoms get worse.  These symptoms may be an emergency. Get help right away. Call 911. Do not wait to see if the symptoms will go away. Do not drive yourself to the hospital. This information is not intended to replace advice given to you by your health care provider. Make sure you discuss any questions you have with your health care provider. Document Revised: 07/05/2022 Document Reviewed: 03/11/2022 Elsevier Patient Education  2024  [employer].

## 2023-01-17 NOTE — Progress Notes (Signed)
Virtual Visit via Video Note  I connected with Amber Barnes on 01/17/23 at  8:00 AM EDT by a video enabled telemedicine application and verified that I am speaking with the correct person using two identifiers. Location patient: home Location provider: work  Persons participating in the virtual visit: patient, provider  I discussed the limitations of evaluation and management by telemedicine and the availability of in person appointments. The patient expressed understanding and agreed to proceed.  HPI: Complains of cough and congestion which started 5 days ago.  Symptoms are improved. Congestion is more clear , and can be thick. Cough is in the morning, improved from over all. She had 'a little bit' of sore throat and cough.  Denies fever, sob, cp.    She tested positive for COVID yesterday.  History of atrial fibrillation, hypertension, OSA, breast cancer Status post atrial fibrillation ablation 12/12/2022.  She is compliant with Eliquis  Follow up cardiology 01/09/23  No h/o CKD  Former smoker.   ROS: See pertinent positives and negatives per HPI.  EXAM:  VITALS per patient if applicable: There were no vitals taken for this visit. BP Readings from Last 3 Encounters:  01/09/23 (!) 150/78  12/20/22 120/80  12/14/22 113/63   Wt Readings from Last 3 Encounters:  01/09/23 240 lb (108.9 kg)  12/20/22 240 lb (108.9 kg)  12/20/22 243 lb (110.2 kg)    GENERAL: alert, oriented, appears well and in no acute distress  HEENT: atraumatic, conjunttiva clear, no obvious abnormalities on inspection of external nose and ears  NECK: normal movements of the head and neck  LUNGS: on inspection no signs of respiratory distress, breathing rate appears normal, no obvious gross SOB, gasping or wheezing  CV: no obvious cyanosis  MS: moves all visible extremities without noticeable abnormality  PSYCH/NEURO: pleasant and cooperative, no obvious depression or anxiety, speech and thought  processing grossly intact  ASSESSMENT AND PLAN: COVID-19 Assessment & Plan: Afebrile. Symptoms improving. Day 5 of symptom onset. Paxlovid has very strong interactions with flecainide and eliquis.  We discussed treating conservatively.  Advised patient to start over-the-counter antihistamine such as Claritin.  Advised that Flonase was safe as well as Mucinex DM.  Counseled on the importance of avoidance of decongestants in the setting of hypertension, history of atrial fibrillation.  Patient will let me know how she is doing and if symptoms persist or worsen       -we discussed possible serious and likely etiologies, options for evaluation and workup, limitations of telemedicine visit vs in person visit, treatment, treatment risks and precautions. Pt prefers to treat via telemedicine empirically rather then risking or undertaking an in person visit at this moment.    I discussed the assessment and treatment plan with the patient. The patient was provided an opportunity to ask questions and all were answered. The patient agreed with the plan and demonstrated an understanding of the instructions.   The patient was advised to call back or seek an in-person evaluation if the symptoms worsen or if the condition fails to improve as anticipated.  Advised if desired AVS can be mailed or viewed via MyChart if Mychart user.   Rennie Plowman, FNP

## 2023-01-17 NOTE — Assessment & Plan Note (Addendum)
Afebrile. Symptoms improving. Day 5 of symptom onset. Paxlovid has very strong interactions with flecainide and eliquis.  We discussed treating conservatively.  Advised patient to start over-the-counter antihistamine such as Claritin.  Advised that Flonase was safe as well as Mucinex DM.  Counseled on the importance of avoidance of decongestants in the setting of hypertension, history of atrial fibrillation.  Patient will let me know how she is doing and if symptoms persist or worsen

## 2023-01-18 ENCOUNTER — Other Ambulatory Visit: Payer: Self-pay | Admitting: Family

## 2023-01-18 NOTE — Telephone Encounter (Signed)
Done

## 2023-01-31 ENCOUNTER — Inpatient Hospital Stay: Payer: Medicare Other | Admitting: Oncology

## 2023-01-31 ENCOUNTER — Inpatient Hospital Stay: Payer: Medicare Other

## 2023-02-05 ENCOUNTER — Other Ambulatory Visit: Payer: Self-pay | Admitting: Family

## 2023-02-05 DIAGNOSIS — G2581 Restless legs syndrome: Secondary | ICD-10-CM

## 2023-02-06 NOTE — Progress Notes (Unsigned)
Date:  02/07/2023   ID:  Amber Barnes, DOB 03-19-1951, MRN [id number]  Patient Location:  [address] [place] [address]   Provider location:   [facility], [place] office  PCP:  Allegra Grana, FNP  Cardiologist:  [facility]  Chief Complaint  Patient presents with   3 month follow up     Patient c/o twinges in chest, shortness of breath, weakness and fatigue. Medications reviewed by the patient verbally.     History of Present Illness:    Amber Barnes is a 72 y.o. female past medical history of Paroxysmal atrial fibrillation, cardioversion 2018 Normal sinus rhythm did not hold on flecainide, changed to amiodarone  cardioversion, normal sinus rhythm obesity,  arthritis,  hyperlipidemia,  hypertension,  OSA , not on CPAP Migraines, anxiety Stress test January 2018 showing no ischemia, Ejection fraction 55-65% CHA2DS2-VASc of at least 3 (female, age, HTN). Coronary calcium score 31.6.  EF 50 to 55% in NSR on June 2024 who presents for follow-up of her hyperlipidemia, hypertension, persistent atrial fibrillation  Last seen by myself in clinic April 2024 Underwent A-fib ablation June 2024 by Dr. Lalla Brothers on 12/12/2022  Postprocedural pericarditis started on ibuprofen, colchicine Remains on Eliquis 5 twice daily, CHADS2VASC score of 5.  Remains on flecainide and bisoprolol  Echo June 2024 EF 50 to 55%  Needs left hip replacement Already had surgery on the right, recovered well  Heart rate low on today's visit, asymptomatic Remains on bisoprolol 15 twice daily with flecainide 100 twice daily  Reports chronic mild shortness of breath, feels this is likely from deconditioning As water walking  Recent testing reviewed Stress test completed November 01, 2022 Perfusion defect noted in the anterior apical, apical septal, apical region, possible small region of ischemia  Echocardiogram ejection fraction 45 to 50%  in afib Moderately reduced RV function Mild to moderate TR  EKG personally reviewed by myself on todays visit EKG Interpretation Date/Time:  Tuesday February 07 2023 10:44:00 EDT Ventricular Rate:  48 PR Interval:  232 QRS Duration:  104 QT Interval:  476 QTC Calculation: 425 R Axis:   -15  Text Interpretation: Sinus bradycardia with 1st degree A-V block Minimal voltage criteria for LVH, may be normal variant ( Cornell product ) Cannot rule out Anterior infarct (cited on or before 07-Feb-2023) When compared with ECG of 09-Jan-2023 10:57, No significant change was found Confirmed by Julien Nordmann [phone]) on 02/07/2023 10:44:55 AM   Other past medical history reviewed In hospital early January 2023 6 days of IV antibiotic for your kidney infection Acute pyelonephritis and ureterovesical junction (UVJ) obstruction and right hydroureteronephrosis --Dr. Richardo Hanks of urology did urgent Bilateral ureteral stent placement.  bisoprolol and Cardizem are held due to your low heart rate.   Eliquis held due to significant blood in the urine. Was in atrial fib on arrival D/c on 07/17/2021  NSR 07/29/2021 when echo performed Normal ejection fraction greater than 60%  Hx of breast cancer HER2 positive in July 2022, underwent lumpectomy, lymph node biopsy, recent start of chemotherapy with weekly Taxol plus  trastuzumab .    Chemotherapy early September 2022 She presented to the emergency room September 26, anorexia, malaise, shortness of breath, atrial fibrillation Heart rate in the emergency room 137 bpm Started on broad-spectrum antibiotics for possible sepsis, neutropenic fevers Started on diltiazem infusion for rate control, has been continued on her Eliquis Heart rate running low 100 bpm, asymptomatic  cardioversion procedure 09/07/2016  Past Medical History:  Diagnosis Date   Achilles tendinitis    Acute medial meniscal tear    Allergy    Anxiety    Atrial fibrillation (HCC)    a. new  onset 07/2016; b. CHADS2VASc --> 4 (HTN, female, age, CHF)-->eliquis; c. 12/2022 s/p PVI.   Chemotherapy induced nausea and vomiting 05/18/2021   Chronic anticoagulation    Apixaban   Chronic heart failure with mid-range ejection fraction (HFmrEF) (HCC)    a. 10/2022 Echo: EF 45-50%, glob HK, mod reduced RV fxn, RVSP , mildly dil LA, mild MR, mild-mod TR. Asc Ao 39mm.   Chronic lower back pain    CKD (chronic kidney disease), stage II    Colon polyp    Complication of anesthesia    Emergence delirium; hypoxia   COPD (chronic obstructive pulmonary disease) (HCC)    Cystic breast    Depression    Endometriosis    Fibrocystic breast disease    GERD (gastroesophageal reflux disease)    History of stress test    a. 07/2016 MV: EF 55-65%. Small, mild defect  in mid anteroseptal, apical septal, and apical locations. No ischemia-->low risk; b. 10/2022 MV: mod sized partially rev apical ant, apical septal, and apical defect. EF 52%. No signific cor Ca2+; b. 11/2022 Cardiac CT: Ca2+ = 31.6 (58th%'ile).   Hyperlipidemia    Hypertension    Insomnia    Lipoma    Malignant neoplasm of upper-outer quadrant of right female breast (HCC)    Stage 1A invasive mammary carcinoma (cT1b or T1c N0); ER (-), PR (+), HER2/neu (+)   Migraine    Mild Mitral regurgitation    a. 07/2016 Echo: Mild MR; b. 10/2022 Echo: Mild MR.   Nephrolithiasis    OSA (obstructive sleep apnea)    non-compliant with nocturnal PAP therapy   Osteoarthritis    left knee   Osteopenia    Peptic ulcer disease    a. H. pylori   Pneumonia    RLS (restless legs syndrome)    Tendonitis    Thyroid disease    Tricuspid regurgitation    a. 10/2022 Echo: mild-mod TR.   Vitamin D deficiency    Past Surgical History:  Procedure Laterality Date   ABDOMINAL HYSTERECTOMY     ovaries left   ACHILLES TENDON SURGERY     2012,01/2017   ATRIAL FIBRILLATION ABLATION N/A 12/12/2022   Procedure: ATRIAL FIBRILLATION ABLATION;  Surgeon: Lanier Prude, MD;  Location: [facility];  Service: Cardiovascular;  Laterality: N/A;   BACK SURGERY     fusion lumbar   BREAST BIOPSY Right 01/15/2021   Affirm biopsy-"X" clip-INVASIVE MAMMARY CARCINOMA   BREAST BIOPSY Right 01/15/2021   u/s bx-"venus" clip INVASIVE MAMMARY CARCINOMA   BREAST BIOPSY     BREAST BIOPSY Right 01/04/2023   u/s bx ribbon clip path pending   BREAST BIOPSY Right 01/04/2023   [place] RT BREAST BX W LOC DEV 1ST LESION IMG BX SPEC US GUIDE 01/04/2023 [facility]   BREAST LUMPECTOMY,RADIO FREQ LOCALIZER,AXILLARY SENTINEL LYMPH NODE BIOPSY Right 02/17/2021   Procedure: BREAST LUMPECTOMY,RADIO FREQ LOCALIZER,AXILLARY SENTINEL LYMPH NODE BIOPSY;  Surgeon: Campbell Lerner, MD;  Location: [facility];  Service: General;  Laterality: Right;   CARDIOVERSION N/A 09/07/2016   Procedure: CARDIOVERSION;  Surgeon: Antonieta Iba, MD;  Location: [facility];  Service: Cardiovascular;  Laterality: N/A;   CARDIOVERSION N/A 10/19/2016   Procedure: CARDIOVERSION;  Surgeon: Antonieta Iba, MD;  Location: [facility];  Service: Cardiovascular;  Laterality: N/A;   CARDIOVERSION N/A 12/14/2016   Procedure: CARDIOVERSION;  Surgeon: Antonieta Iba, MD;  Location: [facility];  Service: Cardiovascular;  Laterality: N/A;   COLONOSCOPY     COLONOSCOPY WITH PROPOFOL N/A 05/13/2020   Procedure: COLONOSCOPY WITH PROPOFOL;  Surgeon: Toledo, Boykin Nearing, MD;  Location: [facility];  Service: Gastroenterology;  Laterality: N/A;  ELIQUIS   CYSTOSCOPY WITH STENT PLACEMENT Bilateral 07/12/2021   Procedure: CYSTOSCOPY WITH STENT PLACEMENT;  Surgeon: Sondra Come, MD;  Location: [facility];  Service: Urology;  Laterality: Bilateral;   CYSTOSCOPY/URETEROSCOPY/HOLMIUM LASER/STENT PLACEMENT Bilateral 07/30/2021   Procedure: CYSTOSCOPY/URETEROSCOPY/HOLMIUM LASER/BILATERAL STENT REMOVAL;  Surgeon: Sondra Come, MD;  Location: [facility];  Service: Urology;  Laterality: Bilateral;    ESOPHAGOGASTRODUODENOSCOPY     ESOPHAGOGASTRODUODENOSCOPY (EGD) WITH PROPOFOL N/A 05/13/2020   Procedure: ESOPHAGOGASTRODUODENOSCOPY (EGD) WITH PROPOFOL;  Surgeon: Toledo, Boykin Nearing, MD;  Location: [facility];  Service: Gastroenterology;  Laterality: N/A;   FOOT SURGERY Left    tendon   JOINT REPLACEMENT Right    hip   PORTA CATH INSERTION N/A 03/23/2021   Procedure: PORTA CATH INSERTION;  Surgeon: Renford Dills, MD;  Location: [facility];  Service: Cardiovascular;  Laterality: N/A;   PORTACATH PLACEMENT N/A 03/23/2021   Procedure: ATTEMPTED INSERTION PORT-A-CATH;  Surgeon: Campbell Lerner, MD;  Location: [facility];  Service: General;  Laterality: N/A;   THYROIDECTOMY  1990   half thyroid lobectomy secondary to a benign nodule   TOTAL HIP ARTHROPLASTY Right 08/21/2018   Procedure: TOTAL HIP ARTHROPLASTY ANTERIOR APPROACH-RIGHT CELL SAVER REQUESTED;  Surgeon: Kennedy Bucker, MD;  Location: [facility];  Service: Orthopedics;  Laterality: Right;      Allergies:   Prednisone, Amiodarone, Hydrocodone-acetaminophen, Oxycodone, Tapentadol, Tramadol, Adhesive [tape], and Latex   Social History   Tobacco Use   Smoking status: Former    Current packs/day: 0.00    Average packs/day: 0.3 packs/day for 10.0 years (2.5 ttl pk-yrs)    Types: Cigarettes    Start date: 73    Quit date: 2005    Years since quitting: 19.5   Smokeless tobacco: Never   Tobacco comments:    smoked for 10 years, 1 pack every 2-3 days  Vaping Use   Vaping status: Never Used  Substance Use Topics   Alcohol use: Yes    Alcohol/week: 1.0 standard drink of alcohol    Types: 1 Glasses of wine per week    Comment: rarely   Drug use: No     Current Outpatient Medications on File Prior to Visit  Medication Sig Dispense Refill   acetaminophen (TYLENOL) 500 MG tablet Take 500-1,000 mg by mouth every 6 (six) hours as needed for mild pain or moderate pain.     ALPRAZolam (XANAX) 0.25 MG tablet TAKE 1  TABLET BY MOUTH 2 TIMES DAILY AS NEEDED FOR ANXIETY. 60 tablet 2   bisoprolol (ZEBETA) 10 MG tablet Take 1.5 tablets (15 mg total) by mouth 2 (two) times daily. 270 tablet 3   busPIRone (BUSPAR) 5 MG tablet TAKE 1 TABLET BY MOUTH THREE TIMES A DAY AS NEEDED 60 tablet 1   Cholecalciferol (VITAMIN D3) 5000 units CAPS Take 5,000 Units by mouth daily.     diphenhydrAMINE (BENADRYL) 25 mg capsule Take 1 capsule (25 mg total) by mouth every 8 (eight) hours as needed for itching or allergies. 30 capsule 0   ELIQUIS 5 MG TABS tablet TAKE 1 TABLET BY MOUTH TWICE  A DAY 180 tablet 1   escitalopram (LEXAPRO) 10 MG tablet Take 1 tablet (10 mg total) by mouth daily. 90 tablet 1   flecainide (TAMBOCOR) 100 MG tablet Take 1 tablet (100 mg total) by mouth 2 (two) times daily. 180 tablet 3   furosemide (LASIX) 20 MG tablet Take 1 tablet (20 mg total) by mouth daily. 180 tablet 0   Multiple Vitamin (MULTIVITAMIN) tablet Take 1 tablet by mouth daily.     nystatin cream (MYCOSTATIN) Apply 1 application. topically 2 (two) times daily. Prn abdomen (Patient taking differently: Apply 1 application  topically 2 (two) times daily as needed for dry skin. Prn abdomen) 60 g 5   ondansetron (ZOFRAN) 8 MG tablet Take 1 tablet (8 mg total) by mouth every 8 (eight) hours as needed. 60 tablet 1   potassium chloride SA (KLOR-CON M) 20 MEQ tablet Take 1 tablet (20 mEq total) by mouth daily. 90 tablet 3   ranolazine (RANEXA) 500 MG 12 hr tablet TAKE 1 TABLET BY MOUTH TWICE A DAY 180 tablet 2   rOPINIRole (REQUIP) 0.5 MG tablet Take 1 tablet (0.5 mg total) by mouth at bedtime. 90 tablet 0   rOPINIRole (REQUIP) 2 MG tablet TAKE 1 TABLET BY MOUTH EVERYDAY AT BEDTIME 90 tablet 3   rosuvastatin (CRESTOR) 40 MG tablet Take 1 tablet (40 mg total) by mouth daily. 90 tablet 3   pantoprazole (PROTONIX) 40 MG tablet Take 1 tablet (40 mg total) by mouth daily. (Patient not taking: Reported on 02/07/2023) 45 tablet 0   [DISCONTINUED]  prochlorperazine (COMPAZINE) 10 MG tablet Take 1 tablet (10 mg total) by mouth every 6 (six) hours as needed (Nausea or vomiting). 30 tablet 1   No current facility-administered medications on file prior to visit.     Family Hx: The patient's family history includes Breast cancer in her paternal aunt; Cancer in her brother and mother; Cancer (age of onset: 80) in her sister; Cancer (age of onset: 35) in her father; Depression in her sister; Heart attack (age of onset: 67) in her father; Heart disease in her father; Hyperlipidemia in her father; Hypertension in her father. There is no history of Migraines or Thyroid cancer.  ROS:   Please see the history of present illness.    Review of Systems  Constitutional:  Positive for malaise/fatigue.  HENT: Negative.    Respiratory: Negative.    Cardiovascular: Negative.   Gastrointestinal:  Positive for nausea.  Musculoskeletal: Negative.   Neurological: Negative.   Psychiatric/Behavioral: Negative.    All other systems reviewed and are negative.    Labs/Other Tests and Data Reviewed:    Recent Labs: 09/12/2022: TSH 3.40 10/31/2022: ALT 13 12/13/2022: B Natriuretic Peptide 76.9 12/14/2022: BUN 14; Creatinine, Ser 0.77; Hemoglobin 11.0; Platelets PLATELET CLUMPS NOTED ON SMEAR, UNABLE TO ESTIMATE; Potassium 3.4; Sodium 137   Recent Lipid Panel Lab Results  Component Value Date/Time   CHOL 101 12/14/2022 04:49 AM   TRIG 51 12/14/2022 04:49 AM   HDL 48 12/14/2022 04:49 AM   CHOLHDL 2.1 12/14/2022 04:49 AM   LDLCALC 43 12/14/2022 04:49 AM    Wt Readings from Last 3 Encounters:  02/07/23 238 lb (108 kg)  01/09/23 240 lb (108.9 kg)  12/20/22 240 lb (108.9 kg)     Exam:    Vital Signs: Vital signs may also be detailed in the HPI BP 132/80 (BP Location: Left Arm, Patient Position: Sitting, Cuff Size: Large)   Pulse (!) 48   Ht 5'  5" (1.651 m)   Wt 238 lb (108 kg)   SpO2 97%   BMI 39.61 kg/m   Constitutional:  oriented to person,  place, and time. No distress.  HENT:  Head: Grossly normal Eyes:  no discharge. No scleral icterus.  Neck: No JVD, no carotid bruits  Cardiovascular: Regular rate and rhythm, no murmurs appreciated Pulmonary/Chest: Clear to auscultation bilaterally, no wheezes or rails Abdominal: Soft.  no distension.  no tenderness.  Musculoskeletal: Normal range of motion Neurological:  normal muscle tone. Coordination normal. No atrophy Skin: Skin warm and dry Psychiatric: normal affect, pleasant  ASSESSMENT & PLAN:    Problem List Items Addressed This Visit       Cardiology Problems   Atrial fibrillation (HCC) - Primary (Chronic)   Relevant Orders   EKG 12-Lead (Completed)   Chronic diastolic CHF (congestive heart failure) (HCC)   Relevant Orders   EKG 12-Lead (Completed)   Other Visit Diagnoses     Essential hypertension       Relevant Orders   EKG 12-Lead (Completed)   Hyperlipidemia LDL goal <100       Coronary artery calcification seen on CAT scan       Relevant Orders   EKG 12-Lead (Completed)   Dyspnea on exertion       Atrial flutter, unspecified type (HCC)       Relevant Orders   EKG 12-Lead (Completed)      Preop cardiovascular hip surgery acceptable risk for left hip replacement surgery No further cardiac testing needed  Paroxysmal atrial fibrillation/atrial flutter Ablation 6/24 Maintining NSR  Breast cancer Reports she has completed treatments  Diastolic CHF Euvolemic on Lasix 20 daily Chronic mild shortness of breath, extra Lasix as needed Walking program recommended as tolerated with her hip  Bradycardia Recommend she decrease bisoprolol down to 10 twice daily Monitor heart rate and call our office if it continues to run in the 40s Goal would be in the mid 50s or higher  Essential hypertension Change to bisoprolol dosing as above   Total encounter time more than 30 minutes  Greater than 50% was spent in counseling and coordination of care with the  patient    Signed, Julien Nordmann, MD  [facility] [address], [place], [place] [address]

## 2023-02-07 ENCOUNTER — Encounter: Payer: Self-pay | Admitting: Cardiovascular Disease

## 2023-02-07 ENCOUNTER — Ambulatory Visit: Payer: Medicare Other | Attending: Cardiovascular Disease | Admitting: Cardiovascular Disease

## 2023-02-07 ENCOUNTER — Other Ambulatory Visit (HOSPITAL_COMMUNITY): Payer: Self-pay

## 2023-02-07 VITALS — BP 132/80 | HR 48 | Ht 65.0 in | Wt 238.0 lb

## 2023-02-07 DIAGNOSIS — I10 Essential (primary) hypertension: Secondary | ICD-10-CM | POA: Diagnosis not present

## 2023-02-07 DIAGNOSIS — I5032 Chronic diastolic (congestive) heart failure: Secondary | ICD-10-CM

## 2023-02-07 DIAGNOSIS — R0609 Other forms of dyspnea: Secondary | ICD-10-CM | POA: Diagnosis not present

## 2023-02-07 DIAGNOSIS — I4819 Other persistent atrial fibrillation: Secondary | ICD-10-CM | POA: Diagnosis not present

## 2023-02-07 DIAGNOSIS — E785 Hyperlipidemia, unspecified: Secondary | ICD-10-CM | POA: Diagnosis not present

## 2023-02-07 DIAGNOSIS — I2510 Atherosclerotic heart disease of native coronary artery without angina pectoris: Secondary | ICD-10-CM | POA: Diagnosis not present

## 2023-02-07 DIAGNOSIS — I4892 Unspecified atrial flutter: Secondary | ICD-10-CM

## 2023-02-07 MED ORDER — BISOPROLOL FUMARATE 10 MG PO TABS: 10.0000 mg | ORAL_TABLET | Freq: Two times a day (BID) | ORAL | 3 refills | Status: DC

## 2023-02-07 NOTE — Patient Instructions (Addendum)
Medication Instructions:  Please decrease the bisoprolol down to 10 mg twice a day Monitor heart rate , call if low Goal mid 50s  If you need a refill on your cardiac medications before your next appointment, please call your pharmacy.   Lab work: No new labs needed  Testing/Procedures: No new testing needed  Follow-Up: At [facility], you and your health needs are our priority.  As part of our continuing mission to provide you with exceptional heart care, we have created designated Provider Care Teams.  These Care Teams include your primary Cardiologist (physician) and Advanced Practice Providers (APPs -  Physician Assistants and Nurse Practitioners) who all work together to provide you with the care you need, when you need it.  You will need a follow up appointment in 12 months  Providers on your designated Care Team:   Nicolasa Ducking, NP Eula Listen, PA-C Cadence Fransico [place], [place]  COVID-19 Vaccine Information can be found at: [email or web address] For questions related to vaccine distribution or appointments, please email [email or web address] or call [phone].

## 2023-02-14 ENCOUNTER — Inpatient Hospital Stay: Payer: Medicare Other | Attending: Oncology

## 2023-02-14 ENCOUNTER — Encounter: Payer: Self-pay | Admitting: Oncology

## 2023-02-14 ENCOUNTER — Inpatient Hospital Stay (HOSPITAL_BASED_OUTPATIENT_CLINIC_OR_DEPARTMENT_OTHER): Payer: Medicare Other | Admitting: Oncology

## 2023-02-14 VITALS — BP 118/87 | HR 52 | Temp 96.0°F | Resp 18 | Wt 239.8 lb

## 2023-02-14 DIAGNOSIS — C50411 Malignant neoplasm of upper-outer quadrant of right female breast: Secondary | ICD-10-CM | POA: Diagnosis not present

## 2023-02-14 DIAGNOSIS — Z86010 Personal history of colonic polyps: Secondary | ICD-10-CM | POA: Diagnosis not present

## 2023-02-14 DIAGNOSIS — E785 Hyperlipidemia, unspecified: Secondary | ICD-10-CM | POA: Insufficient documentation

## 2023-02-14 DIAGNOSIS — K219 Gastro-esophageal reflux disease without esophagitis: Secondary | ICD-10-CM | POA: Insufficient documentation

## 2023-02-14 DIAGNOSIS — Z79899 Other long term (current) drug therapy: Secondary | ICD-10-CM | POA: Insufficient documentation

## 2023-02-14 DIAGNOSIS — M9903 Segmental and somatic dysfunction of lumbar region: Secondary | ICD-10-CM | POA: Diagnosis not present

## 2023-02-14 DIAGNOSIS — Z171 Estrogen receptor negative status [ER-]: Secondary | ICD-10-CM

## 2023-02-14 DIAGNOSIS — Z87891 Personal history of nicotine dependence: Secondary | ICD-10-CM | POA: Insufficient documentation

## 2023-02-14 DIAGNOSIS — Z95828 Presence of other vascular implants and grafts: Secondary | ICD-10-CM

## 2023-02-14 DIAGNOSIS — Z9221 Personal history of antineoplastic chemotherapy: Secondary | ICD-10-CM | POA: Insufficient documentation

## 2023-02-14 DIAGNOSIS — I4891 Unspecified atrial fibrillation: Secondary | ICD-10-CM | POA: Insufficient documentation

## 2023-02-14 DIAGNOSIS — Z923 Personal history of irradiation: Secondary | ICD-10-CM | POA: Insufficient documentation

## 2023-02-14 DIAGNOSIS — Z7901 Long term (current) use of anticoagulants: Secondary | ICD-10-CM | POA: Insufficient documentation

## 2023-02-14 DIAGNOSIS — E559 Vitamin D deficiency, unspecified: Secondary | ICD-10-CM | POA: Insufficient documentation

## 2023-02-14 DIAGNOSIS — G629 Polyneuropathy, unspecified: Secondary | ICD-10-CM

## 2023-02-14 DIAGNOSIS — M8580 Other specified disorders of bone density and structure, unspecified site: Secondary | ICD-10-CM | POA: Insufficient documentation

## 2023-02-14 DIAGNOSIS — N182 Chronic kidney disease, stage 2 (mild): Secondary | ICD-10-CM | POA: Insufficient documentation

## 2023-02-14 DIAGNOSIS — I509 Heart failure, unspecified: Secondary | ICD-10-CM | POA: Diagnosis not present

## 2023-02-14 DIAGNOSIS — I480 Paroxysmal atrial fibrillation: Secondary | ICD-10-CM

## 2023-02-14 DIAGNOSIS — M542 Cervicalgia: Secondary | ICD-10-CM | POA: Diagnosis not present

## 2023-02-14 DIAGNOSIS — J449 Chronic obstructive pulmonary disease, unspecified: Secondary | ICD-10-CM | POA: Insufficient documentation

## 2023-02-14 DIAGNOSIS — M9901 Segmental and somatic dysfunction of cervical region: Secondary | ICD-10-CM | POA: Diagnosis not present

## 2023-02-14 DIAGNOSIS — I10 Essential (primary) hypertension: Secondary | ICD-10-CM | POA: Insufficient documentation

## 2023-02-14 DIAGNOSIS — G43001 Migraine without aura, not intractable, with status migrainosus: Secondary | ICD-10-CM | POA: Diagnosis not present

## 2023-02-14 LAB — CBC WITH DIFFERENTIAL (CANCER CENTER ONLY)
Abs Immature Granulocytes: 0.01 10*3/uL (ref 0.00–0.07)
Basophils Absolute: 0 10*3/uL (ref 0.0–0.1)
Basophils Relative: 1 %
Eosinophils Absolute: 0.1 10*3/uL (ref 0.0–0.5)
Eosinophils Relative: 1 %
HCT: 40 % (ref 36.0–46.0)
Hemoglobin: 13.4 g/dL (ref 12.0–15.0)
Immature Granulocytes: 0 %
Lymphocytes Relative: 23 %
Lymphs Abs: 1.2 10*3/uL (ref 0.7–4.0)
MCH: 31.5 pg (ref 26.0–34.0)
MCHC: 33.5 g/dL (ref 30.0–36.0)
MCV: 94.1 fL (ref 80.0–100.0)
Monocytes Absolute: 0.3 10*3/uL (ref 0.1–1.0)
Monocytes Relative: 7 %
Neutro Abs: 3.3 10*3/uL (ref 1.7–7.7)
Neutrophils Relative %: 68 %
Platelet Count: 153 10*3/uL (ref 150–400)
RBC: 4.25 MIL/uL (ref 3.87–5.11)
RDW: 13.2 % (ref 11.5–15.5)
WBC Count: 4.9 10*3/uL (ref 4.0–10.5)
nRBC: 0 % (ref 0.0–0.2)

## 2023-02-14 LAB — CMP (CANCER CENTER ONLY)
ALT: 15 U/L (ref 0–44)
AST: 20 U/L (ref 15–41)
Albumin: 3.8 g/dL (ref 3.5–5.0)
Alkaline Phosphatase: 74 U/L (ref 38–126)
Anion gap: 10 (ref 5–15)
BUN: 16 mg/dL (ref 8–23)
CO2: 26 mmol/L (ref 22–32)
Calcium: 8.7 mg/dL — ABNORMAL LOW (ref 8.9–10.3)
Chloride: 103 mmol/L (ref 98–111)
Creatinine: 0.93 mg/dL (ref 0.44–1.00)
GFR, Estimated: >60 mL/min (ref >60)
Glucose, Bld: 98 mg/dL (ref 70–99)
Potassium: 3.5 mmol/L (ref 3.5–5.1)
Sodium: 139 mmol/L (ref 135–145)
Total Bilirubin: 0.8 mg/dL (ref 0.3–1.2)
Total Protein: 6.9 g/dL (ref 6.5–8.1)

## 2023-02-14 MED ORDER — HEPARIN SOD (PORK) LOCK FLUSH 100 UNIT/ML IV SOLN
500.0000 [IU] | Freq: Once | INTRAVENOUS | Status: AC
  Administered 2023-02-14: 500 [IU] via INTRAVENOUS
  Filled 2023-02-14: qty 5

## 2023-02-14 MED ORDER — SODIUM CHLORIDE 0.9% FLUSH
10.0000 mL | INTRAVENOUS | Status: DC | PRN
  Administered 2023-02-14: 10 mL via INTRAVENOUS
  Filled 2023-02-14: qty 10

## 2023-02-14 NOTE — Assessment & Plan Note (Signed)
Patient is on Eliquis 5 mg twice daily. Echo showed decreased LVEF 45-50% S/p ablation. . Follow-up with cardiology.

## 2023-02-14 NOTE — Assessment & Plan Note (Addendum)
#  Right breast invasive carcinoma pT1b pN0 ER negative, PR weakly positive HER 2 positive.  Not on endocrine therapy due to unclear magnitude of benefit Patient does not tolerate adjuvant chemotherapy with Taxol and trastuzumab despite Taxol dose reduction.-->Status post adjuvant radiation--> 07/27/2022 Finished maintenance Trastuzumab Labs are reviewed and discussed with patient. Bilateral diagnostic mammogram in June 2024 showed indeterminant right breast lesion, s/p biopsy, pathology showed fat necrosis, negative for malignancy Continue annual diagnostic mammogram next due June 2025

## 2023-02-14 NOTE — Assessment & Plan Note (Signed)
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 Has not established with acupuncture clinic yet

## 2023-02-14 NOTE — Progress Notes (Signed)
Hematology/Oncology Progress note [phone] [phone]     CHIEF COMPLAINTS/REASON FOR VISIT:  Follow-up for stage I HER2 positive breast cancer  ASSESSMENT & PLAN:   Cancer Staging  Malignant neoplasm of upper-outer quadrant of right female breast [facility]) Staging form: Breast, AJCC 8th Edition - Pathologic stage from 03/03/2021: Stage IA (pT1b, pN0, cM0, G3, ER-, PR+, HER2+) - Signed by [name], MD on 03/03/2021   Malignant neoplasm of upper-outer quadrant of right female breast [facility]) #Right breast invasive carcinoma pT1b pN0 ER negative, PR weakly positive HER 2 positive.  Not on endocrine therapy due to unclear magnitude of benefit Patient does not tolerate adjuvant chemotherapy with Taxol and trastuzumab despite Taxol dose reduction.-->Status post adjuvant radiation--> 07/27/2022 Finished maintenance Trastuzumab Labs are reviewed and discussed with patient. Bilateral diagnostic mammogram in June 2024 showed indeterminant right breast lesion, s/p biopsy, pathology showed fat necrosis, negative for malignancy Continue annual diagnostic mammogram next due June 2025  Neuropathy Pre-existing neuropathy in her fingertips bilaterally- secondary to cervical spine radiculopathy Improved, she is off gabapentin. Has not established with acupuncture clinic yet   Atrial fibrillation [facility]) Patient is on Eliquis 5 mg twice daily. Echo showed decreased LVEF 45-50% S/p ablation. . Follow-up with cardiology.   Hypocalcemia She is on calcium 2000 mg daily. . Calcium is stable.    Follow up  6 months. All questions were answered. The patient knows to call the clinic with any problems, questions or concerns.  [name], MD, PhD [facility] Hematology Oncology 02/14/2023     HISTORY OF PRESENTING ILLNESS:   Amber Barnes is a  72 y.o.  female presents for follow up of right breast cancer.  Oncology History  Malignant neoplasm of upper-outer quadrant of right  female breast (HCC)  01/19/2021 Initial Diagnosis   Breast cancer in female  -12/30/2020, patient had a screening mammogram done which showed possible mass and asymmetry in the right breast. 01/08/2021, diagnostic mammogram of the right breast showed 2 adjacent irregular spiculated masses.  Larger 1 was 10 mm and a smaller mass measured 5 mm. 01/15/2021 stereotactic biopsy of the right breast smaller mass showed invasive carcinoma no special type, grade 2, DCIS present, LVI negative.Ultrasound guided biopsy of the larger mass showed invasive mammary carcinoma  02/17/2021, patient is status post lumpectomy and sentinel lymph node biopsy Pathology showed invasive mammary carcinoma, 2 separate foci, DCIS, negative sentinel lymph node biopsy 0/1, grade 3, or margins negative for invasive carcinoma.  ER -, PR + [1-10%], HER2 +  pT1b(m) pN0   Strong family history of cancer, Discussed again about genetic testing. patient adamantly declined.   03/03/2021 Cancer Staging   Staging form: Breast, AJCC 8th Edition - Pathologic stage from 03/03/2021: Stage IA (pT1b, pN0, cM0, G3, ER-, PR+, HER2+) - Signed by [name], MD on 03/03/2021 Stage prefix: Initial diagnosis Multigene prognostic tests performed: None Histologic grading system: 3 grade system   03/08/2021 Echocardiogram   echocardiogram showed LVEF 60-65%. Per patient, she has been seen by Dr. Windell Hummingbird team and was cleared for proceeding with chemotherapy.    02/2021 Genetic Testing   Strong family history of cancer, Discussed again about genetic testing. patient adamantly declined.   03/23/2021 Procedure   Mediport by vascular surgeon   03/24/2021 -  Chemotherapy   03/24/2021 - 03/31/2021 Taxol 80 mg/m2 Herby Abraham 04/05/2021 - 04/09/2021 patient was admitted due to neutropenic fever with associated SIRS.  Patient was treated with empiric cefepime and vancomycin until date of discharge when  ANC recovered to 1.4. Post hospitalization, patient also had  COVID-19 infection so her follow-up appointment was further postponed for another 2 weeks.  05/10/2021 resumed dose reduce Taxol 65mg /m2 Herby Abraham 05/25/2021-06/30/2021, cycle 2 dose reduced Taxol 65mg /m2 /trastuzumab   admitted from 07/12/2021 - 07/17/2021 due to acute pyelonephritis UVJ obstruction and a right hydro ureteralnephrosis.  Patient was treated with IV antibiotics status post emergent bilateral ureteral stent placement.  Discharged home with a course of oral antibiotics.  Shared decision made to hold chemotherapy and proceed with radiation.    08/18/2021 - 09/08/2021 Radiation Therapy    adjuvant breast radiation   10/06/2021 -  Chemotherapy   10/06/2021, shared decision was made to proceed with maintenance trastuzumab.  Her cardiologist Dr. Mariah Milling gave clearance to proceed. Plan Transtuzumab for up to a year if tolerates.    12/14/2021 Mammogram   Bilateral diagnostic mammogram 1. Probably benign right breast calcifications along the posterior lumpectomy bed felt to represent early changes of fat necrosis.Recommend short-term imaging follow-up. 2. No mammographic evidence of malignancy on the left.   03/02/2022 Echocardiogram   LVEF 60-65%, grade I diastolic dysfuction   05/31/2022 Echocardiogram   LVEF is 55 to 60%, Grade II diastolic dysfunction    10/20/2022 Echocardiogram   LVEF 45-50%   12/28/2022 Mammogram   Bilateral diagnostic mammogram showed There is an indeterminate 8 mm mass in the RIGHT upper inner breast  S/p US guided biopsy of right breast lesion.  Pathology showed Fat necrosis, negative for malignancy.  Radiology feels this results is concordant.    07/07/2023 Mammogram   Unilateral diagnostic mammogram -right 1. No evidence of malignancy. 2. The previously described probably benign right breast calcifications are benign calcifications associated with fat necrosis and oil cyst formation.   HER2-positive carcinoma of breast (HCC)    Left butt/thigh  tenderness for a few weeks, ultrasound soft tissue left lower extremity negative.   INTERVAL HISTORY Amber Barnes is a 72 y.o. female who has above history reviewed by me today presents for follow up visit for management of right side HER 2 positive breast cancer Neuropathy has improved. Off Gabapentin.  S/p afib ablation, with pericarditis. Chest pain has improved. She follows up with cardiology.  No new breast concerns.    Review of Systems  Constitutional:  Positive for fatigue. Negative for appetite change, chills, fever and unexpected weight change.  HENT:   Negative for hearing loss and voice change.   Respiratory:  Negative for chest tightness and cough.   Cardiovascular:  Negative for chest pain and leg swelling.  Gastrointestinal:  Negative for abdominal distention, abdominal pain, blood in stool, nausea and vomiting.  Endocrine: Negative for hot flashes.  Genitourinary:  Negative for difficulty urinating, frequency and hematuria.   Musculoskeletal:  Positive for arthralgias.  Skin:  Negative for itching and rash.  Neurological:  Positive for numbness. Negative for dizziness and extremity weakness.  Hematological:  Negative for adenopathy.  Psychiatric/Behavioral:  Negative for confusion.     MEDICAL HISTORY:  Past Medical History:  Diagnosis Date   Achilles tendinitis    Acute medial meniscal tear    Allergy    Anxiety    Atrial fibrillation (HCC)    a. new onset 07/2016; b. CHADS2VASc --> 4 (HTN, female, age, CHF)-->eliquis; c. 12/2022 s/p PVI.   Chemotherapy induced nausea and vomiting 05/18/2021   Chronic anticoagulation    Apixaban   Chronic heart failure with mid-range ejection fraction (HFmrEF) (HCC)    a. 10/2022 Echo: EF  45-50%, glob HK, mod reduced RV fxn, RVSP , mildly dil LA, mild MR, mild-mod TR. Asc Ao 39mm.   Chronic lower back pain    CKD (chronic kidney disease), stage II    Colon polyp    Complication of anesthesia    Emergence delirium;  hypoxia   COPD (chronic obstructive pulmonary disease) (HCC)    Cystic breast    Depression    Endometriosis    Fibrocystic breast disease    GERD (gastroesophageal reflux disease)    History of stress test    a. 07/2016 MV: EF 55-65%. Small, mild defect  in mid anteroseptal, apical septal, and apical locations. No ischemia-->low risk; b. 10/2022 MV: mod sized partially rev apical ant, apical septal, and apical defect. EF 52%. No signific cor Ca2+; b. 11/2022 Cardiac CT: Ca2+ = 31.6 (58th%'ile).   Hyperlipidemia    Hypertension    Insomnia    Lipoma    Malignant neoplasm of upper-outer quadrant of right female breast (HCC)    Stage 1A invasive mammary carcinoma (cT1b or T1c N0); ER (-), PR (+), HER2/neu (+)   Migraine    Mild Mitral regurgitation    a. 07/2016 Echo: Mild MR; b. 10/2022 Echo: Mild MR.   Nephrolithiasis    OSA (obstructive sleep apnea)    non-compliant with nocturnal PAP therapy   Osteoarthritis    left knee   Osteopenia    Peptic ulcer disease    a. H. pylori   Pneumonia    RLS (restless legs syndrome)    Tendonitis    Thyroid disease    Tricuspid regurgitation    a. 10/2022 Echo: mild-mod TR.   Vitamin D deficiency     SURGICAL HISTORY: Past Surgical History:  Procedure Laterality Date   ABDOMINAL HYSTERECTOMY     ovaries left   ACHILLES TENDON SURGERY     2012,01/2017   ATRIAL FIBRILLATION ABLATION N/A 12/12/2022   Procedure: ATRIAL FIBRILLATION ABLATION;  Surgeon: Lanier Prude, MD;  Location: [facility];  Service: Cardiovascular;  Laterality: N/A;   BACK SURGERY     fusion lumbar   BREAST BIOPSY Right 01/15/2021   Affirm biopsy-"X" clip-INVASIVE MAMMARY CARCINOMA   BREAST BIOPSY Right 01/15/2021   u/s bx-"venus" clip INVASIVE MAMMARY CARCINOMA   BREAST BIOPSY     BREAST BIOPSY Right 01/04/2023   u/s bx ribbon clip path pending   BREAST BIOPSY Right 01/04/2023   [place] RT BREAST BX W LOC DEV 1ST LESION IMG BX SPEC US GUIDE 01/04/2023  [facility]   BREAST LUMPECTOMY,RADIO FREQ LOCALIZER,AXILLARY SENTINEL LYMPH NODE BIOPSY Right 02/17/2021   Procedure: BREAST LUMPECTOMY,RADIO FREQ LOCALIZER,AXILLARY SENTINEL LYMPH NODE BIOPSY;  Surgeon: Campbell Lerner, MD;  Location: [facility] ORS;  Service: General;  Laterality: Right;   CARDIOVERSION N/A 09/07/2016   Procedure: CARDIOVERSION;  Surgeon: Antonieta Iba, MD;  Location: [facility] ORS;  Service: Cardiovascular;  Laterality: N/A;   CARDIOVERSION N/A 10/19/2016   Procedure: CARDIOVERSION;  Surgeon: Antonieta Iba, MD;  Location: [facility];  Service: Cardiovascular;  Laterality: N/A;   CARDIOVERSION N/A 12/14/2016   Procedure: CARDIOVERSION;  Surgeon: Antonieta Iba, MD;  Location: [facility];  Service: Cardiovascular;  Laterality: N/A;   COLONOSCOPY     COLONOSCOPY WITH PROPOFOL N/A 05/13/2020   Procedure: COLONOSCOPY WITH PROPOFOL;  Surgeon: Toledo, Boykin Nearing, MD;  Location: [facility];  Service: Gastroenterology;  Laterality: N/A;  ELIQUIS   CYSTOSCOPY WITH STENT PLACEMENT Bilateral 07/12/2021   Procedure: CYSTOSCOPY WITH STENT PLACEMENT;  Surgeon: Sondra Come, MD;  Location: [facility] ORS;  Service: Urology;  Laterality: Bilateral;   CYSTOSCOPY/URETEROSCOPY/HOLMIUM LASER/STENT PLACEMENT Bilateral 07/30/2021   Procedure: CYSTOSCOPY/URETEROSCOPY/HOLMIUM LASER/BILATERAL STENT REMOVAL;  Surgeon: Sondra Come, MD;  Location: [facility];  Service: Urology;  Laterality: Bilateral;   ESOPHAGOGASTRODUODENOSCOPY     ESOPHAGOGASTRODUODENOSCOPY (EGD) WITH PROPOFOL N/A 05/13/2020   Procedure: ESOPHAGOGASTRODUODENOSCOPY (EGD) WITH PROPOFOL;  Surgeon: Toledo, Boykin Nearing, MD;  Location: [facility];  Service: Gastroenterology;  Laterality: N/A;   FOOT SURGERY Left    tendon   JOINT REPLACEMENT Right    hip   PORTA CATH INSERTION N/A 03/23/2021   Procedure: PORTA CATH INSERTION;  Surgeon: Renford Dills, MD;  Location: [facility]VAS[facility];  Service: Cardiovascular;  Laterality:  N/A;   PORTACATH PLACEMENT N/A 03/23/2021   Procedure: ATTEMPTED INSERTION PORT-A-CATH;  Surgeon: Campbell Lerner, MD;  Location: [facility];  Service: General;  Laterality: N/A;   THYROIDECTOMY  1990   half thyroid lobectomy secondary to a benign nodule   TOTAL HIP ARTHROPLASTY Right 08/21/2018   Procedure: TOTAL HIP ARTHROPLASTY ANTERIOR APPROACH-RIGHT CELL SAVER REQUESTED;  Surgeon: Kennedy Bucker, MD;  Location: [facility];  Service: Orthopedics;  Laterality: Right;    SOCIAL HISTORY: Social History   Socioeconomic History   Marital status: Married    Spouse name: jeffrey   Number of children: 1   Years of education: Not on file   Highest education level: 12th grade  Occupational History    Comment: disabled  Tobacco Use   Smoking status: Former    Current packs/day: 0.00    Average packs/day: 0.3 packs/day for 10.0 years (2.5 ttl pk-yrs)    Types: Cigarettes    Start date: 36    Quit date: 2005    Years since quitting: 19.6   Smokeless tobacco: Never   Tobacco comments:    smoked for 10 years, 1 pack every 2-3 days  Vaping Use   Vaping status: Never Used  Substance and Sexual Activity   Alcohol use: Yes    Alcohol/week: 1.0 standard drink of alcohol    Types: 1 Glasses of wine per week    Comment: rarely   Drug use: No   Sexual activity: Not Currently  Other Topics Concern   Not on file  Social History Narrative   Moved to [place] from [place], originally from [place].    1 cup of caffeine daily   Right handed   Lives with husband, step-daughter and child       Social Determinants of Health   Financial Resource Strain: Low Risk  (12/20/2022)   Overall Financial Resource Strain (CARDIA)    Difficulty of Paying Living Expenses: Not hard at all  Food Insecurity: No Food Insecurity (12/20/2022)   Hunger Vital Sign    Worried About Running Out of Food in the Last Year: Never true    Ran Out of Food in the Last Year: Never true   Transportation Needs: No Transportation Needs (12/20/2022)   PRAPARE - Administrator, Civil Service (Medical): No    Lack of Transportation (Non-Medical): No  Physical Activity: Inactive (12/20/2022)   Exercise Vital Sign    Days of Exercise per Week: 0 days    Minutes of Exercise per Session: 0 min  Stress: Stress Concern Present (12/20/2022)   [employer] of Occupational Health - Occupational Stress Questionnaire    Feeling of Stress : To some extent  Social Connections: Moderately Integrated (12/20/2022)  Social Advertising account executive [NHANES]    Frequency of Communication with Friends and Family: More than three times a week    Frequency of Social Gatherings with Friends and Family: Once a week    Attends Religious Services: More than 4 times per year    Active Member of [employer] or Organizations: No    Attends Banker Meetings: Never    Marital Status: Married  Catering manager Violence: Not At Risk (12/20/2022)   Humiliation, Afraid, Rape, and Kick questionnaire    Fear of Current or Ex-Partner: No    Emotionally Abused: No    Physically Abused: No    Sexually Abused: No    FAMILY HISTORY: Family History  Problem Relation Age of Onset   Cancer Mother        leukemia   Cancer Father 2       colon   Heart disease Father    Heart attack Father 33       died from MI   Hyperlipidemia Father    Hypertension Father    Cancer Sister 27       ovarian   Depression Sister    Cancer Brother        CLL   Breast cancer Paternal Aunt        40's   Migraines Neg Hx    Thyroid cancer Neg Hx     ALLERGIES:  is allergic to prednisone, amiodarone, hydrocodone-acetaminophen, oxycodone, tapentadol, tramadol, adhesive [tape], and latex.  MEDICATIONS:  Current Outpatient Medications  Medication Sig Dispense Refill   acetaminophen (TYLENOL) 500 MG tablet Take 500-1,000 mg by mouth every 6 (six) hours as needed for mild pain or moderate pain.      ALPRAZolam (XANAX) 0.25 MG tablet TAKE 1 TABLET BY MOUTH 2 TIMES DAILY AS NEEDED FOR ANXIETY. 60 tablet 2   bisoprolol (ZEBETA) 10 MG tablet Take 1 tablet (10 mg total) by mouth 2 (two) times daily. 180 tablet 3   busPIRone (BUSPAR) 5 MG tablet TAKE 1 TABLET BY MOUTH THREE TIMES A DAY AS NEEDED 60 tablet 1   Cholecalciferol (VITAMIN D3) 5000 units CAPS Take 5,000 Units by mouth daily.     diphenhydrAMINE (BENADRYL) 25 mg capsule Take 1 capsule (25 mg total) by mouth every 8 (eight) hours as needed for itching or allergies. 30 capsule 0   ELIQUIS 5 MG TABS tablet TAKE 1 TABLET BY MOUTH TWICE A DAY 180 tablet 1   escitalopram (LEXAPRO) 10 MG tablet Take 1 tablet (10 mg total) by mouth daily. 90 tablet 1   flecainide (TAMBOCOR) 100 MG tablet Take 1 tablet (100 mg total) by mouth 2 (two) times daily. 180 tablet 3   furosemide (LASIX) 20 MG tablet Take 1 tablet (20 mg total) by mouth daily. 180 tablet 0   Multiple Vitamin (MULTIVITAMIN) tablet Take 1 tablet by mouth daily.     nystatin cream (MYCOSTATIN) Apply 1 application. topically 2 (two) times daily. Prn abdomen (Patient taking differently: Apply 1 application  topically 2 (two) times daily as needed for dry skin. Prn abdomen) 60 g 5   ondansetron (ZOFRAN) 8 MG tablet Take 1 tablet (8 mg total) by mouth every 8 (eight) hours as needed. 60 tablet 1   potassium chloride SA (KLOR-CON M) 20 MEQ tablet Take 1 tablet (20 mEq total) by mouth daily. 90 tablet 3   ranolazine (RANEXA) 500 MG 12 hr tablet TAKE 1 TABLET BY MOUTH TWICE A DAY 180 tablet 2  rOPINIRole (REQUIP) 0.5 MG tablet TAKE 1 TABLET BY MOUTH AT BEDTIME. 90 tablet 3   rOPINIRole (REQUIP) 2 MG tablet TAKE 1 TABLET BY MOUTH EVERYDAY AT BEDTIME 90 tablet 3   rosuvastatin (CRESTOR) 40 MG tablet Take 1 tablet (40 mg total) by mouth daily. 90 tablet 3   pantoprazole (PROTONIX) 40 MG tablet Take 1 tablet (40 mg total) by mouth daily. (Patient not taking: Reported on 02/07/2023) 45 tablet 0   No  current facility-administered medications for this visit.   Facility-Administered Medications Ordered in Other Visits  Medication Dose Route Frequency Provider Last Rate Last Admin   sodium chloride flush (NS) 0.9 % injection 10 mL  10 mL Intravenous PRN [name], MD   10 mL at 02/14/23 1023     PHYSICAL EXAMINATION: ECOG PERFORMANCE STATUS: 1 - Symptomatic but completely ambulatory Vitals:   02/14/23 1026  BP: 118/87  Pulse: (!) 52  Resp: 18  Temp: (!) 96 F (35.6 C)  SpO2: 97%    Filed Weights   02/14/23 1026  Weight: 239 lb 12.8 oz (108.8 kg)     Physical Exam Constitutional:      General: She is not in acute distress.    Appearance: She is obese.  HENT:     Head: Normocephalic and atraumatic.  Eyes:     General: No scleral icterus. Cardiovascular:     Rate and Rhythm: Normal rate. Rhythm irregular.     Heart sounds: Normal heart sounds.  Pulmonary:     Effort: Pulmonary effort is normal. No respiratory distress.     Breath sounds: No wheezing.  Abdominal:     General: Bowel sounds are normal. There is no distension.     Palpations: Abdomen is soft.  Musculoskeletal:        General: No deformity. Normal range of motion.     Cervical back: Normal range of motion and neck supple.     Comments: 1+ edema bilaterally in lower extremity    Skin:    General: Skin is warm and dry.     Findings: No erythema or rash.  Neurological:     Mental Status: She is alert and oriented to person, place, and time. Mental status is at baseline.     Cranial Nerves: No cranial nerve deficit.     Coordination: Coordination normal.  Psychiatric:        Mood and Affect: Mood normal.     . LABORATORY DATA:  I have reviewed the data as listed    Latest Ref Rng & Units 02/14/2023   10:18 AM 12/14/2022    4:49 AM 12/13/2022   10:25 AM  CBC  WBC 4.0 - 10.5 K/uL 4.9  7.3  11.3   Hemoglobin 12.0 - 15.0 g/dL 24.4  01.0  27.2   Hematocrit 36.0 - 46.0 % 40.0  32.7  40.6   Platelets  150 - 400 K/uL 153  PLATELET CLUMPS NOTED ON SMEAR, UNABLE TO ESTIMATE  149       Latest Ref Rng & Units 02/14/2023   10:18 AM 12/14/2022    4:49 AM 12/13/2022   10:25 AM  CMP  Glucose 70 - 99 mg/dL 98  536  644   BUN 8 - 23 mg/dL 16  14  14    Creatinine 0.44 - 1.00 mg/dL 0.34  7.42  5.95   Sodium 135 - 145 mmol/L 139  137  138   Potassium 3.5 - 5.1 mmol/L 3.5  3.4  3.7  Chloride 98 - 111 mmol/L 103  106  103   CO2 22 - 32 mmol/L 26  24  29    Calcium 8.9 - 10.3 mg/dL 8.7  8.0  8.5   Total Protein 6.5 - 8.1 g/dL 6.9     Total Bilirubin 0.3 - 1.2 mg/dL 0.8     Alkaline Phos 38 - 126 U/L 74     AST 15 - 41 U/L 20     ALT 0 - 44 U/L 15         RADIOGRAPHIC STUDIES: I have personally reviewed the radiological images as listed and agreed with the findings in the report. No results found.

## 2023-02-14 NOTE — Assessment & Plan Note (Signed)
She is on calcium 2000 mg daily. . Calcium is stable.

## 2023-02-14 NOTE — Patient Instructions (Signed)

## 2023-02-15 DIAGNOSIS — M9903 Segmental and somatic dysfunction of lumbar region: Secondary | ICD-10-CM | POA: Diagnosis not present

## 2023-02-15 DIAGNOSIS — M9901 Segmental and somatic dysfunction of cervical region: Secondary | ICD-10-CM | POA: Diagnosis not present

## 2023-02-15 DIAGNOSIS — M542 Cervicalgia: Secondary | ICD-10-CM | POA: Diagnosis not present

## 2023-02-15 DIAGNOSIS — G43001 Migraine without aura, not intractable, with status migrainosus: Secondary | ICD-10-CM | POA: Diagnosis not present

## 2023-02-16 DIAGNOSIS — M542 Cervicalgia: Secondary | ICD-10-CM | POA: Diagnosis not present

## 2023-02-16 DIAGNOSIS — M9903 Segmental and somatic dysfunction of lumbar region: Secondary | ICD-10-CM | POA: Diagnosis not present

## 2023-02-16 DIAGNOSIS — M9901 Segmental and somatic dysfunction of cervical region: Secondary | ICD-10-CM | POA: Diagnosis not present

## 2023-02-16 DIAGNOSIS — G43001 Migraine without aura, not intractable, with status migrainosus: Secondary | ICD-10-CM | POA: Diagnosis not present

## 2023-02-20 DIAGNOSIS — M9903 Segmental and somatic dysfunction of lumbar region: Secondary | ICD-10-CM | POA: Diagnosis not present

## 2023-02-20 DIAGNOSIS — M9901 Segmental and somatic dysfunction of cervical region: Secondary | ICD-10-CM | POA: Diagnosis not present

## 2023-02-20 DIAGNOSIS — M542 Cervicalgia: Secondary | ICD-10-CM | POA: Diagnosis not present

## 2023-02-20 DIAGNOSIS — G43001 Migraine without aura, not intractable, with status migrainosus: Secondary | ICD-10-CM | POA: Diagnosis not present

## 2023-02-22 DIAGNOSIS — M9901 Segmental and somatic dysfunction of cervical region: Secondary | ICD-10-CM | POA: Diagnosis not present

## 2023-02-22 DIAGNOSIS — G43001 Migraine without aura, not intractable, with status migrainosus: Secondary | ICD-10-CM | POA: Diagnosis not present

## 2023-02-22 DIAGNOSIS — M542 Cervicalgia: Secondary | ICD-10-CM | POA: Diagnosis not present

## 2023-02-22 DIAGNOSIS — M9903 Segmental and somatic dysfunction of lumbar region: Secondary | ICD-10-CM | POA: Diagnosis not present

## 2023-02-23 ENCOUNTER — Encounter (INDEPENDENT_AMBULATORY_CARE_PROVIDER_SITE_OTHER): Payer: Self-pay

## 2023-02-23 DIAGNOSIS — M9903 Segmental and somatic dysfunction of lumbar region: Secondary | ICD-10-CM | POA: Diagnosis not present

## 2023-02-23 DIAGNOSIS — G43001 Migraine without aura, not intractable, with status migrainosus: Secondary | ICD-10-CM | POA: Diagnosis not present

## 2023-02-23 DIAGNOSIS — M9901 Segmental and somatic dysfunction of cervical region: Secondary | ICD-10-CM | POA: Diagnosis not present

## 2023-02-23 DIAGNOSIS — M542 Cervicalgia: Secondary | ICD-10-CM | POA: Diagnosis not present

## 2023-02-25 ENCOUNTER — Other Ambulatory Visit: Payer: Self-pay | Admitting: Cardiovascular Disease

## 2023-02-25 ENCOUNTER — Other Ambulatory Visit: Payer: Self-pay | Admitting: Family

## 2023-02-25 DIAGNOSIS — F32A Depression, unspecified: Secondary | ICD-10-CM

## 2023-02-27 DIAGNOSIS — G43001 Migraine without aura, not intractable, with status migrainosus: Secondary | ICD-10-CM | POA: Diagnosis not present

## 2023-02-27 DIAGNOSIS — M9901 Segmental and somatic dysfunction of cervical region: Secondary | ICD-10-CM | POA: Diagnosis not present

## 2023-02-27 DIAGNOSIS — M542 Cervicalgia: Secondary | ICD-10-CM | POA: Diagnosis not present

## 2023-02-27 DIAGNOSIS — M9903 Segmental and somatic dysfunction of lumbar region: Secondary | ICD-10-CM | POA: Diagnosis not present

## 2023-02-27 NOTE — Telephone Encounter (Signed)
Prescription refill request for Eliquis received. Indication:afib Last office visit:7/24 Scr:0.93  8/24 Age: 72 Weight:108.8  kg  Prescription refilled

## 2023-02-27 NOTE — Telephone Encounter (Signed)
Refill request

## 2023-03-01 DIAGNOSIS — M9903 Segmental and somatic dysfunction of lumbar region: Secondary | ICD-10-CM | POA: Diagnosis not present

## 2023-03-01 DIAGNOSIS — M9901 Segmental and somatic dysfunction of cervical region: Secondary | ICD-10-CM | POA: Diagnosis not present

## 2023-03-01 DIAGNOSIS — G43001 Migraine without aura, not intractable, with status migrainosus: Secondary | ICD-10-CM | POA: Diagnosis not present

## 2023-03-01 DIAGNOSIS — M542 Cervicalgia: Secondary | ICD-10-CM | POA: Diagnosis not present

## 2023-03-02 DIAGNOSIS — M9901 Segmental and somatic dysfunction of cervical region: Secondary | ICD-10-CM | POA: Diagnosis not present

## 2023-03-02 DIAGNOSIS — G43001 Migraine without aura, not intractable, with status migrainosus: Secondary | ICD-10-CM | POA: Diagnosis not present

## 2023-03-02 DIAGNOSIS — M9903 Segmental and somatic dysfunction of lumbar region: Secondary | ICD-10-CM | POA: Diagnosis not present

## 2023-03-02 DIAGNOSIS — M542 Cervicalgia: Secondary | ICD-10-CM | POA: Diagnosis not present

## 2023-03-06 DIAGNOSIS — G43001 Migraine without aura, not intractable, with status migrainosus: Secondary | ICD-10-CM | POA: Diagnosis not present

## 2023-03-06 DIAGNOSIS — M542 Cervicalgia: Secondary | ICD-10-CM | POA: Diagnosis not present

## 2023-03-06 DIAGNOSIS — M9901 Segmental and somatic dysfunction of cervical region: Secondary | ICD-10-CM | POA: Diagnosis not present

## 2023-03-06 DIAGNOSIS — M9903 Segmental and somatic dysfunction of lumbar region: Secondary | ICD-10-CM | POA: Diagnosis not present

## 2023-03-08 ENCOUNTER — Ambulatory Visit: Payer: Medicare Other | Admitting: Cardiology

## 2023-03-08 ENCOUNTER — Inpatient Hospital Stay: Payer: Medicare Other

## 2023-03-08 DIAGNOSIS — M9903 Segmental and somatic dysfunction of lumbar region: Secondary | ICD-10-CM | POA: Diagnosis not present

## 2023-03-08 DIAGNOSIS — G43001 Migraine without aura, not intractable, with status migrainosus: Secondary | ICD-10-CM | POA: Diagnosis not present

## 2023-03-08 DIAGNOSIS — M9901 Segmental and somatic dysfunction of cervical region: Secondary | ICD-10-CM | POA: Diagnosis not present

## 2023-03-08 DIAGNOSIS — M542 Cervicalgia: Secondary | ICD-10-CM | POA: Diagnosis not present

## 2023-03-09 DIAGNOSIS — M9903 Segmental and somatic dysfunction of lumbar region: Secondary | ICD-10-CM | POA: Diagnosis not present

## 2023-03-09 DIAGNOSIS — M542 Cervicalgia: Secondary | ICD-10-CM | POA: Diagnosis not present

## 2023-03-09 DIAGNOSIS — G43001 Migraine without aura, not intractable, with status migrainosus: Secondary | ICD-10-CM | POA: Diagnosis not present

## 2023-03-09 DIAGNOSIS — M9901 Segmental and somatic dysfunction of cervical region: Secondary | ICD-10-CM | POA: Diagnosis not present

## 2023-03-10 ENCOUNTER — Other Ambulatory Visit: Payer: Self-pay | Admitting: Family

## 2023-03-12 ENCOUNTER — Other Ambulatory Visit: Payer: Self-pay | Admitting: Family

## 2023-03-12 ENCOUNTER — Other Ambulatory Visit: Payer: Self-pay | Admitting: Cardiovascular Disease

## 2023-03-14 ENCOUNTER — Encounter: Payer: Self-pay | Admitting: Family

## 2023-03-14 ENCOUNTER — Ambulatory Visit (INDEPENDENT_AMBULATORY_CARE_PROVIDER_SITE_OTHER): Payer: Medicare Other | Admitting: Family

## 2023-03-14 VITALS — BP 120/76 | HR 67 | Temp 98.7°F | Ht 65.0 in | Wt 238.0 lb

## 2023-03-14 DIAGNOSIS — F32A Depression, unspecified: Secondary | ICD-10-CM

## 2023-03-14 DIAGNOSIS — F419 Anxiety disorder, unspecified: Secondary | ICD-10-CM | POA: Diagnosis not present

## 2023-03-14 DIAGNOSIS — I5032 Chronic diastolic (congestive) heart failure: Secondary | ICD-10-CM

## 2023-03-14 DIAGNOSIS — Z23 Encounter for immunization: Secondary | ICD-10-CM

## 2023-03-14 DIAGNOSIS — M9901 Segmental and somatic dysfunction of cervical region: Secondary | ICD-10-CM | POA: Diagnosis not present

## 2023-03-14 DIAGNOSIS — I480 Paroxysmal atrial fibrillation: Secondary | ICD-10-CM

## 2023-03-14 DIAGNOSIS — M542 Cervicalgia: Secondary | ICD-10-CM | POA: Diagnosis not present

## 2023-03-14 DIAGNOSIS — G43001 Migraine without aura, not intractable, with status migrainosus: Secondary | ICD-10-CM | POA: Diagnosis not present

## 2023-03-14 DIAGNOSIS — M9903 Segmental and somatic dysfunction of lumbar region: Secondary | ICD-10-CM | POA: Diagnosis not present

## 2023-03-14 MED ORDER — FUROSEMIDE 20 MG PO TABS
20.0000 mg | ORAL_TABLET | Freq: Every day | ORAL | 3 refills | Status: DC
Start: 2023-03-14 — End: 2024-02-14

## 2023-03-14 NOTE — Progress Notes (Signed)
Assessment & Plan:  Chronic diastolic CHF (congestive heart failure) (HCC) Assessment & Plan: Chronic, symptomatically stable at this time.  She is taking Lasix 20 mg once daily.  She will take an additional dose for leg swelling if needed.  Counseled her on the need of potassium chloride tablet 20 meq daily; advised to take an additional potassium chloride  tablet if she uses Lasix additional dose.  She verbalized understanding  Orders: -     Furosemide; Take 1 tablet (20 mg total) by mouth daily. May take additional 20mg  PO prn for leg swelling  Dispense: 180 tablet; Refill: 3  Encounter for immunization -     Flu Vaccine Trivalent High Dose (Fluad)  PAF (paroxysmal atrial fibrillation) (HCC) Assessment & Plan: Maintaining SR since ablation 12/2022.  She remains compliant with bisoprolol 2 mg twice daily, Eliquis 5 mg twice daily.  She is followed closely by Dr. Lalla Brothers, Dr. Mariah Milling.  Will follow   Anxiety and depression Assessment & Plan: Chronic, stable.  Continue escitalopram 10mg , xanax 0.25mg  po prn       Return precautions given.   Risks, benefits, and alternatives of the medications and treatment plan prescribed today were discussed, and patient expressed understanding.   Education regarding symptom management and diagnosis given to patient on AVS either electronically or printed.  Return in about 4 months (around 07/14/2023).  Rennie Plowman, FNP  Subjective:    Patient ID: Amber Barnes, female    DOB: [birth year], 72 y.o.   MRN: [id number]  CC: Amber Barnes is a 72 y.o. female who presents today for follow up.   HPI: Anxiety is well controlled.   She takes xanax 0.25mg  at bedtime prn. Lexapro is working well.   Planning left hip replacement with Dr Rosita Kea, in October 2024 or later.   She is following with massage therapist and chiropractor with improvement.   No nsaids. She is using tylenol arthritis.    Follow up Dr Mariah Milling 02/07/23 for preop  cardiovascular hip surgery.  Acceptable risk for left hip replacement surgery.  Paroxysmal atrial fibrillation/atrial flutter.  Ablation 12/2022.  Maintaining NSR. Maintained on Lasix 20 mg daily.  Decrease bisoprolol to 10 mg twice daily.  Follow up with Dr Lalla Brothers tomorrow   Allergies: Prednisone, Amiodarone, Hydrocodone-acetaminophen, Oxycodone, Tapentadol, Tramadol, Adhesive [tape], and Latex Current Outpatient Medications on File Prior to Visit  Medication Sig Dispense Refill   acetaminophen (TYLENOL) 500 MG tablet Take 500-1,000 mg by mouth every 6 (six) hours as needed for mild pain or moderate pain.     ALPRAZolam (XANAX) 0.25 MG tablet TAKE 1 TABLET BY MOUTH 2 TIMES DAILY AS NEEDED FOR ANXIETY. 60 tablet 2   bisoprolol (ZEBETA) 10 MG tablet Take 1 tablet (10 mg total) by mouth 2 (two) times daily. 180 tablet 3   busPIRone (BUSPAR) 5 MG tablet TAKE 1 TABLET BY MOUTH THREE TIMES A DAY AS NEEDED 60 tablet 1   Cholecalciferol (VITAMIN D3) 5000 units CAPS Take 5,000 Units by mouth daily.     diphenhydrAMINE (BENADRYL) 25 mg capsule Take 1 capsule (25 mg total) by mouth every 8 (eight) hours as needed for itching or allergies. 30 capsule 0   ELIQUIS 5 MG TABS tablet TAKE 1 TABLET BY MOUTH TWICE A DAY 180 tablet 1   escitalopram (LEXAPRO) 10 MG tablet TAKE 1 TABLET BY MOUTH EVERY DAY 90 tablet 1   Multiple Vitamin (MULTIVITAMIN) tablet Take 1 tablet by mouth daily.     nystatin cream (MYCOSTATIN)  Apply 1 application. topically 2 (two) times daily. Prn abdomen (Patient taking differently: Apply 1 application  topically 2 (two) times daily as needed for dry skin. Prn abdomen) 60 g 5   ondansetron (ZOFRAN) 8 MG tablet Take 1 tablet (8 mg total) by mouth every 8 (eight) hours as needed. 60 tablet 1   potassium chloride SA (KLOR-CON M) 20 MEQ tablet Take 1 tablet (20 mEq total) by mouth daily. 90 tablet 3   rOPINIRole (REQUIP) 0.5 MG tablet TAKE 1 TABLET BY MOUTH AT BEDTIME. 90 tablet 3    rOPINIRole (REQUIP) 2 MG tablet TAKE 1 TABLET BY MOUTH EVERYDAY AT BEDTIME 90 tablet 3   rosuvastatin (CRESTOR) 40 MG tablet TAKE 1 TABLET BY MOUTH EVERY DAY 90 tablet 3   flecainide (TAMBOCOR) 100 MG tablet TAKE 1 TABLET BY MOUTH TWICE A DAY 180 tablet 3   ranolazine (RANEXA) 500 MG 12 hr tablet TAKE 1 TABLET BY MOUTH TWICE A DAY 180 tablet 2   [DISCONTINUED] prochlorperazine (COMPAZINE) 10 MG tablet Take 1 tablet (10 mg total) by mouth every 6 (six) hours as needed (Nausea or vomiting). 30 tablet 1   No current facility-administered medications on file prior to visit.    Review of Systems  Constitutional:  Negative for chills and fever.  Respiratory:  Negative for cough.   Cardiovascular:  Negative for chest pain and palpitations.  Gastrointestinal:  Negative for nausea and vomiting.  Musculoskeletal:  Positive for arthralgias (left hip).      Objective:    BP 120/76   Pulse 67   Temp 98.7 F (37.1 C) (Oral)   Ht 5\' 5"  (1.651 m)   Wt 238 lb (108 kg)   SpO2 95%   BMI 39.61 kg/m  BP Readings from Last 3 Encounters:  03/14/23 120/76  02/14/23 118/87  02/07/23 132/80   Wt Readings from Last 3 Encounters:  03/14/23 238 lb (108 kg)  02/14/23 239 lb 12.8 oz (108.8 kg)  02/07/23 238 lb (108 kg)    Physical Exam Vitals reviewed.  Constitutional:      Appearance: She is well-developed.  Eyes:     Conjunctiva/sclera: Conjunctivae normal.  Cardiovascular:     Rate and Rhythm: Normal rate and regular rhythm.     Pulses: Normal pulses.     Heart sounds: Normal heart sounds.  Pulmonary:     Effort: Pulmonary effort is normal.     Breath sounds: Normal breath sounds. No wheezing, rhonchi or rales.  Skin:    General: Skin is warm and dry.  Neurological:     Mental Status: She is alert.  Psychiatric:        Speech: Speech normal.        Behavior: Behavior normal.        Thought Content: Thought content normal.

## 2023-03-14 NOTE — Assessment & Plan Note (Signed)
Maintaining SR since ablation 12/2022.  She remains compliant with bisoprolol 2 mg twice daily, Eliquis 5 mg twice daily.  She is followed closely by Dr. Lalla Brothers, Dr. Mariah Milling.  Will follow

## 2023-03-14 NOTE — Assessment & Plan Note (Signed)
Chronic, stable.  Continue escitalopram 10mg , xanax 0.25mg  po prn

## 2023-03-14 NOTE — Assessment & Plan Note (Signed)
Chronic, symptomatically stable at this time.  She is taking Lasix 20 mg once daily.  She will take an additional dose for leg swelling if needed.  Counseled her on the need of potassium chloride tablet 20 meq daily; advised to take an additional potassium chloride  tablet if she uses Lasix additional dose.  She verbalized understanding

## 2023-03-14 NOTE — Patient Instructions (Signed)
Nice to see you!   

## 2023-03-15 ENCOUNTER — Encounter: Payer: Self-pay | Admitting: Cardiology

## 2023-03-15 ENCOUNTER — Ambulatory Visit: Payer: Medicare Other | Attending: Cardiology | Admitting: Cardiology

## 2023-03-15 DIAGNOSIS — M9903 Segmental and somatic dysfunction of lumbar region: Secondary | ICD-10-CM | POA: Diagnosis not present

## 2023-03-15 DIAGNOSIS — I4819 Other persistent atrial fibrillation: Secondary | ICD-10-CM

## 2023-03-15 DIAGNOSIS — I5032 Chronic diastolic (congestive) heart failure: Secondary | ICD-10-CM | POA: Diagnosis not present

## 2023-03-15 DIAGNOSIS — I10 Essential (primary) hypertension: Secondary | ICD-10-CM

## 2023-03-15 DIAGNOSIS — I4892 Unspecified atrial flutter: Secondary | ICD-10-CM | POA: Diagnosis not present

## 2023-03-15 DIAGNOSIS — G43001 Migraine without aura, not intractable, with status migrainosus: Secondary | ICD-10-CM | POA: Diagnosis not present

## 2023-03-15 DIAGNOSIS — M542 Cervicalgia: Secondary | ICD-10-CM | POA: Diagnosis not present

## 2023-03-15 DIAGNOSIS — M9901 Segmental and somatic dysfunction of cervical region: Secondary | ICD-10-CM | POA: Diagnosis not present

## 2023-03-15 NOTE — Patient Instructions (Signed)
Medication Instructions:  Your physician has recommended you make the following change in your medication:  1) STOP taking flecainide   *If you need a refill on your cardiac medications before your next appointment, please call your pharmacy*  Follow-Up: At [facility], you and your health needs are our priority.  As part of our continuing mission to provide you with exceptional heart care, we have created designated Provider Care Teams.  These Care Teams include your primary Cardiologist (physician) and Advanced Practice Providers (APPs -  Physician Assistants and Nurse Practitioners) who all work together to provide you with the care you need, when you need it.  Your next appointment:   6 month(s)  Provider:   Sherie Don, NP

## 2023-03-15 NOTE — Progress Notes (Signed)
Electrophysiology Office Follow up Visit Note:    Date:  03/15/2023   ID:  Amber Barnes, DOB 1950-09-15, MRN [id number]  PCP:  Allegra Grana, FNP  [facility] Cardiologist:  Julien Nordmann, MD  [facility] Electrophysiologist:  Lanier Prude, MD    Interval History:    Amber Barnes is a 72 y.o. female who presents for a follow up visit.   She had an A-fib ablation December 12, 2022.  During the ablation, the pulmonary veins, posterior wall and CTI were ablated.  She did have pericarditis following her ablation.  This is resolved.  Limited echo following the ablation showed no change in the baseline trivial pericardial fluid amounts.  She saw Rosalita Chessman in clinic January 09, 2023.  She reported no recurrent episodes of atrial fibrillation following her ablation.  She saw Dr. Mariah Milling on February 07, 2023.  Her heart rates were low so her bisoprolol was down titrated at that appointment.  Today, she is doing well.  No recurrence of her arrhythmia.  She is been taking Eliquis without missed doses.  She continues to take flecainide.  She is with a friend in clinic today.  She is planning on having a left hip replacement surgery soon.  She has been increasing her level of activity steadily since the ablation procedure.  She reports improved endurance after the ablation.     Past medical, surgical, social and family history were reviewed.  ROS:   Please see the history of present illness.    All other systems reviewed and are negative.  EKGs/Labs/Other Studies Reviewed:    The following studies were reviewed today:    EKG Interpretation Date/Time:  Wednesday March 15 2023 11:49:36 EDT Ventricular Rate:  56 PR Interval:  244 QRS Duration:  106 QT Interval:  466 QTC Calculation: 449 R Axis:   -22  Text Interpretation: Sinus bradycardia with sinus arrhythmia with 1st degree A-V block Confirmed by Steffanie Dunn [phone]) on 03/15/2023 12:02:27 PM    Physical Exam:     VS:  BP 106/60 (BP Location: Left Arm, Patient Position: Sitting, Cuff Size: Large)   Pulse (!) 56   Ht 5\' 5"  (1.651 m)   Wt 241 lb 6 oz (109.5 kg)   SpO2 96%   BMI 40.17 kg/m     Wt Readings from Last 3 Encounters:  03/15/23 241 lb 6 oz (109.5 kg)  03/14/23 238 lb (108 kg)  02/14/23 239 lb 12.8 oz (108.8 kg)     GEN:  Well nourished, well developed in no acute distress.  Obese CARDIAC: RRR, no murmurs, rubs, gallops RESPIRATORY:  Clear to auscultation without rales, wheezing or rhonchi       ASSESSMENT:    1. Persistent atrial fibrillation (HCC)   2. Atrial flutter, unspecified type (HCC)   3. Chronic diastolic CHF (congestive heart failure) (HCC)   4. Primary hypertension    PLAN:    In order of problems listed above:   #Persistent atrial fibrillation and flutter Doing well after her catheter ablation Continue Eliquis for stroke prophylaxis Stop flecainide Continue bisoprolol  #Hypertension At goal today.  Recommend checking blood pressures 1-2 times per week at home and recording the values.  Recommend bringing these recordings to the primary care physician. Continue bisoprolol  #Chronic diastolic heart failure NYHA class II.  Warm and dry on exam today.  Rhythm control indicated as above.  Continue Lasix.  #PreOp Risk Stratification Ms. Behlke's perioperative risk of a major cardiac event is  0.4% according to the Revised Cardiac Risk Index (RCRI).  Therefore, she is at low risk for perioperative complications.    Recommendations: According to ACC/AHA guidelines, no further cardiovascular testing needed.  The patient may proceed to surgery at acceptable risk.   Antiplatelet and/or Anticoagulation Recommendations: Eliquis (Apixaban) can be held for 2 days prior to surgery.  Please resume post op when felt to be safe.     Follow-up 6 months with APP.   Signed, Steffanie Dunn, MD, [facility], [facility] 03/15/2023 12:09 PM    Electrophysiology [facility]

## 2023-03-16 DIAGNOSIS — M9903 Segmental and somatic dysfunction of lumbar region: Secondary | ICD-10-CM | POA: Diagnosis not present

## 2023-03-16 DIAGNOSIS — M9901 Segmental and somatic dysfunction of cervical region: Secondary | ICD-10-CM | POA: Diagnosis not present

## 2023-03-16 DIAGNOSIS — M542 Cervicalgia: Secondary | ICD-10-CM | POA: Diagnosis not present

## 2023-03-16 DIAGNOSIS — G43001 Migraine without aura, not intractable, with status migrainosus: Secondary | ICD-10-CM | POA: Diagnosis not present

## 2023-03-18 ENCOUNTER — Other Ambulatory Visit: Payer: Self-pay | Admitting: Family

## 2023-03-18 DIAGNOSIS — F419 Anxiety disorder, unspecified: Secondary | ICD-10-CM

## 2023-03-20 DIAGNOSIS — M542 Cervicalgia: Secondary | ICD-10-CM | POA: Diagnosis not present

## 2023-03-20 DIAGNOSIS — G43001 Migraine without aura, not intractable, with status migrainosus: Secondary | ICD-10-CM | POA: Diagnosis not present

## 2023-03-20 DIAGNOSIS — M9901 Segmental and somatic dysfunction of cervical region: Secondary | ICD-10-CM | POA: Diagnosis not present

## 2023-03-20 DIAGNOSIS — M9903 Segmental and somatic dysfunction of lumbar region: Secondary | ICD-10-CM | POA: Diagnosis not present

## 2023-03-22 DIAGNOSIS — G43001 Migraine without aura, not intractable, with status migrainosus: Secondary | ICD-10-CM | POA: Diagnosis not present

## 2023-03-22 DIAGNOSIS — M9903 Segmental and somatic dysfunction of lumbar region: Secondary | ICD-10-CM | POA: Diagnosis not present

## 2023-03-22 DIAGNOSIS — M542 Cervicalgia: Secondary | ICD-10-CM | POA: Diagnosis not present

## 2023-03-22 DIAGNOSIS — M9901 Segmental and somatic dysfunction of cervical region: Secondary | ICD-10-CM | POA: Diagnosis not present

## 2023-03-23 DIAGNOSIS — G43001 Migraine without aura, not intractable, with status migrainosus: Secondary | ICD-10-CM | POA: Diagnosis not present

## 2023-03-23 DIAGNOSIS — M9901 Segmental and somatic dysfunction of cervical region: Secondary | ICD-10-CM | POA: Diagnosis not present

## 2023-03-23 DIAGNOSIS — M9903 Segmental and somatic dysfunction of lumbar region: Secondary | ICD-10-CM | POA: Diagnosis not present

## 2023-03-23 DIAGNOSIS — M542 Cervicalgia: Secondary | ICD-10-CM | POA: Diagnosis not present

## 2023-03-27 DIAGNOSIS — M9903 Segmental and somatic dysfunction of lumbar region: Secondary | ICD-10-CM | POA: Diagnosis not present

## 2023-03-27 DIAGNOSIS — M9901 Segmental and somatic dysfunction of cervical region: Secondary | ICD-10-CM | POA: Diagnosis not present

## 2023-03-27 DIAGNOSIS — G43001 Migraine without aura, not intractable, with status migrainosus: Secondary | ICD-10-CM | POA: Diagnosis not present

## 2023-03-27 DIAGNOSIS — M542 Cervicalgia: Secondary | ICD-10-CM | POA: Diagnosis not present

## 2023-03-28 DIAGNOSIS — M25552 Pain in left hip: Secondary | ICD-10-CM | POA: Diagnosis not present

## 2023-03-28 DIAGNOSIS — M9903 Segmental and somatic dysfunction of lumbar region: Secondary | ICD-10-CM | POA: Diagnosis not present

## 2023-03-28 DIAGNOSIS — M1612 Unilateral primary osteoarthritis, left hip: Secondary | ICD-10-CM | POA: Diagnosis not present

## 2023-03-28 DIAGNOSIS — M9901 Segmental and somatic dysfunction of cervical region: Secondary | ICD-10-CM | POA: Diagnosis not present

## 2023-03-28 DIAGNOSIS — G43001 Migraine without aura, not intractable, with status migrainosus: Secondary | ICD-10-CM | POA: Diagnosis not present

## 2023-03-28 DIAGNOSIS — M542 Cervicalgia: Secondary | ICD-10-CM | POA: Diagnosis not present

## 2023-03-30 ENCOUNTER — Telehealth: Payer: Self-pay

## 2023-03-30 ENCOUNTER — Other Ambulatory Visit (HOSPITAL_COMMUNITY): Payer: Self-pay

## 2023-03-30 NOTE — Telephone Encounter (Signed)
..  Pre-operative Risk Assessment    Patient Name: Amber Barnes  DOB: [birth year] MRN: [id number] no new appt      Request for Surgical Clearance    Procedure:   left tha  Date of Surgery:  Clearance 05/11/23                                 Surgeon:  dr Audelia Acton Surgeon's Group or Practice Name:  [facility] orthopaedics and sports medicine Phone number:  [phone] Fax number:  [phone]   Type of Clearance Requested:   - Medical  - Pharmacy:  Hold Apixaban (Eliquis)     Type of Anesthesia:  Not Indicated   Additional requests/questions:   Patient last appointment was 03/15/23 No new appointment Signed, Renee Ramus   03/30/2023, 2:41 PM

## 2023-03-31 ENCOUNTER — Other Ambulatory Visit (HOSPITAL_COMMUNITY): Payer: Self-pay

## 2023-03-31 NOTE — Telephone Encounter (Signed)
Patient with diagnosis of afib on Eliquis for anticoagulation.    Procedure: L-THA Date of procedure: 04/3123   CHA2DS2-VASc Score = 5   This indicates a 7.2% annual risk of stroke. The patient's score is based upon: CHF History: 1 HTN History: 1 Diabetes History: 1 Stroke History: 0 Vascular Disease History: 0 Age Score: 1 Gender Score: 1  CrCl 70 mL/min Platelet count 153 K   ablation December 12, 2022   Per office protocol, patient can hold Eliquis for 3 days prior to procedure.     **This guidance is not considered finalized until pre-operative APP has relayed final recommendations.**

## 2023-03-31 NOTE — Telephone Encounter (Signed)
Name: Amber Barnes  DOB: March 28, 1951  MRN: [id number]   Primary Cardiologist: Julien Nordmann, MD  Chart reviewed as part of pre-operative protocol coverage. Amber Barnes was last seen on 03/15/2023 by Dr. Lalla Brothers.  Per office visit note: "Amber Barnes's perioperative risk of a major cardiac event is 0.4% according to the Revised Cardiac Risk Index (RCRI).  Therefore, she is at low risk for perioperative complications.    Recommendations: According to ACC/AHA guidelines, no further cardiovascular testing needed.  The patient may proceed to surgery at acceptable risk."     Per Pharm D, patient may hold Eliquis for 3 days prior to procedure.    I will route this recommendation to the requesting party via Epic fax function and remove from pre-op pool. Please call with questions.  Carlos Levering, NP 03/31/2023, 1:34 PM

## 2023-04-10 ENCOUNTER — Other Ambulatory Visit: Payer: Self-pay | Admitting: Family

## 2023-04-10 ENCOUNTER — Other Ambulatory Visit: Payer: Self-pay | Admitting: Orthopedic Surgery

## 2023-04-10 DIAGNOSIS — F419 Anxiety disorder, unspecified: Secondary | ICD-10-CM

## 2023-04-19 ENCOUNTER — Ambulatory Visit: Payer: Medicare Other | Admitting: Dermatology

## 2023-04-19 ENCOUNTER — Inpatient Hospital Stay: Payer: Medicare Other | Attending: Oncology

## 2023-04-19 DIAGNOSIS — Z171 Estrogen receptor negative status [ER-]: Secondary | ICD-10-CM | POA: Insufficient documentation

## 2023-04-19 DIAGNOSIS — C50411 Malignant neoplasm of upper-outer quadrant of right female breast: Secondary | ICD-10-CM | POA: Insufficient documentation

## 2023-04-19 DIAGNOSIS — Z95828 Presence of other vascular implants and grafts: Secondary | ICD-10-CM

## 2023-04-19 MED ORDER — HEPARIN SOD (PORK) LOCK FLUSH 100 UNIT/ML IV SOLN
500.0000 [IU] | Freq: Once | INTRAVENOUS | Status: AC
  Administered 2023-04-19: 500 [IU] via INTRAVENOUS
  Filled 2023-04-19: qty 5

## 2023-04-19 MED ORDER — SODIUM CHLORIDE 0.9% FLUSH
10.0000 mL | INTRAVENOUS | Status: DC | PRN
  Administered 2023-04-19: 10 mL via INTRAVENOUS
  Filled 2023-04-19: qty 10

## 2023-04-26 DIAGNOSIS — M1612 Unilateral primary osteoarthritis, left hip: Secondary | ICD-10-CM | POA: Diagnosis not present

## 2023-04-28 ENCOUNTER — Encounter
Admission: RE | Admit: 2023-04-28 | Discharge: 2023-04-28 | Disposition: A | Discharge status: Home / Self Care | Payer: Medicare Other | Source: Ambulatory Visit | Attending: Orthopedic Surgery | Admitting: Orthopedic Surgery

## 2023-04-28 VITALS — BP 139/69 | HR 52 | Resp 12 | Ht 65.0 in | Wt 243.4 lb

## 2023-04-28 DIAGNOSIS — Z01818 Encounter for other preprocedural examination: Secondary | ICD-10-CM

## 2023-04-28 DIAGNOSIS — Z01812 Encounter for preprocedural laboratory examination: Secondary | ICD-10-CM | POA: Insufficient documentation

## 2023-04-28 HISTORY — DX: Anemia, unspecified: D64.9

## 2023-04-28 HISTORY — DX: Personal history of urinary calculi: Z87.442

## 2023-04-28 HISTORY — DX: Personal history of nicotine dependence: Z87.891

## 2023-04-28 HISTORY — DX: Presence of other vascular implants and grafts: Z95.828

## 2023-04-28 HISTORY — DX: Unspecified atrial flutter: I48.92

## 2023-04-28 HISTORY — DX: Atrioventricular block, first degree: I44.0

## 2023-04-28 LAB — TYPE AND SCREEN
ABO/RH(D): A POS
Antibody Screen: NEGATIVE

## 2023-04-28 LAB — COMPREHENSIVE METABOLIC PANEL
ALT: 16 U/L (ref 0–44)
AST: 20 U/L (ref 15–41)
Albumin: 4.1 g/dL (ref 3.5–5.0)
Alkaline Phosphatase: 85 U/L (ref 38–126)
Anion gap: 9 (ref 5–15)
BUN: 17 mg/dL (ref 8–23)
CO2: 29 mmol/L (ref 22–32)
Calcium: 8.9 mg/dL (ref 8.9–10.3)
Chloride: 101 mmol/L (ref 98–111)
Creatinine, Ser: 0.9 mg/dL (ref 0.44–1.00)
GFR, Estimated: >60 mL/min (ref >60)
Glucose, Bld: 84 mg/dL (ref 70–99)
Potassium: 3.8 mmol/L (ref 3.5–5.1)
Sodium: 139 mmol/L (ref 135–145)
Total Bilirubin: 1.1 mg/dL (ref 0.3–1.2)
Total Protein: 7.5 g/dL (ref 6.5–8.1)

## 2023-04-28 LAB — SURGICAL PCR SCREEN
MRSA, PCR: NEGATIVE
Staphylococcus aureus: NEGATIVE

## 2023-04-28 LAB — URINALYSIS, ROUTINE W REFLEX MICROSCOPIC
Bacteria, UA: NONE SEEN
Bilirubin Urine: NEGATIVE
Glucose, UA: NEGATIVE mg/dL
Ketones, ur: NEGATIVE mg/dL
Nitrite: NEGATIVE
Protein, ur: NEGATIVE mg/dL
Specific Gravity, Urine: 1.009 (ref 1.005–1.030)
pH: 5 (ref 5.0–8.0)

## 2023-04-28 NOTE — Patient Instructions (Addendum)
Your procedure is scheduled on:05-11-23 Thursday Report to the Registration Desk on the 1st floor of the [facility].Then proceed to the 2nd floor Surgery Desk To find out your arrival time, please call [phone] between 1PM - 3PM on:05-10-23 Wednesday If your arrival time is 6:00 am, do not arrive before that time as the [facility] entrance doors do not open until 6:00 am.  REMEMBER: Instructions that are not followed completely may result in serious medical risk, up to and including death; or upon the discretion of your surgeon and anesthesiologist your surgery may need to be rescheduled.  Do not eat food after midnight the night before surgery.  No gum chewing or hard candies.  You may however, drink CLEAR liquids up to 2 hours before you are scheduled to arrive for your surgery. Do not drink anything within 2 hours of your scheduled arrival time.  Clear liquids include: - water  - apple juice without pulp - gatorade (not RED colors) - black coffee or tea (Do NOT add milk or creamers to the coffee or tea) Do NOT drink anything that is not on this list.  In addition, your doctor has ordered for you to drink the provided:  Ensure Pre-Surgery Clear Carbohydrate Drink  Drinking this carbohydrate drink up to two hours before surgery helps to reduce insulin resistance and improve patient outcomes. Please complete drinking 2 hours before scheduled arrival time.  One week prior to surgery:Last dose on 05-03-23 Stop Anti-inflammatories (NSAIDS) such as Advil, Aleve, Ibuprofen, Motrin, Naproxen, Naprosyn and Aspirin based products such as Excedrin, Goody's Powder, BC Powder. Stop ANY OVER THE COUNTER supplements until after surgery.(Vitamin D3, Mulitivitamin)   You may however, continue to take Tylenol if needed for pain up until the day of surgery   Stop your ELIQUIS 2 days prior to surgery-Last dose will be on 05-08-23 Monday  Continue taking all of your other prescription  medications up until the day of surgery.  ON THE DAY OF SURGERY ONLY TAKE THESE MEDICATIONS WITH SIPS OF WATER: -bisoprolol (ZEBETA)  -busPIRone (BUSPAR)  -escitalopram (LEXAPRO)  -potassium chloride SA (KLOR-CON M)  -ranolazine (RANEXA)  -rosuvastatin (CRESTOR)  -You may take ALPRAZolam Prudy Feeler) if needed for anxiety  No Alcohol for 24 hours before or after surgery.  No Smoking including e-cigarettes for 24 hours before surgery.  No chewable tobacco products for at least 6 hours before surgery.  No nicotine patches on the day of surgery.  Do not use any "recreational" drugs for at least a week (preferably 2 weeks) before your surgery.  Please be advised that the combination of cocaine and anesthesia may have negative outcomes, up to and including death. If you test positive for cocaine, your surgery will be cancelled.  On the morning of surgery brush your teeth with toothpaste and water, you may rinse your mouth with mouthwash if you wish. Do not swallow any toothpaste or mouthwash.  Use CHG Soap as directed on instruction sheet.  Do not wear jewelry, make-up, hairpins, clips or nail polish.  For welded (permanent) jewelry: bracelets, anklets, waist bands, etc.  Please have this removed prior to surgery.  If it is not removed, there is a chance that hospital personnel will need to cut it off on the day of surgery.  Do not wear lotions, powders, or perfumes.   Do not shave body hair from the neck down 48 hours before surgery.  Contact lenses, hearing aids and dentures may not be worn into surgery.  Do  not bring valuables to the hospital. [facility] is not responsible for any missing/lost belongings or valuables.  Notify your doctor if there is any change in your medical condition (cold, fever, infection).  Wear comfortable clothing (specific to your surgery type) to the hospital.  After surgery, you can help prevent lung complications by doing breathing exercises.  Take  deep breaths and cough every 1-2 hours. Your doctor may order a device called an Incentive Spirometer to help you take deep breaths. When coughing or sneezing, hold a pillow firmly against your incision with both hands. This is called "splinting." Doing this helps protect your incision. It also decreases belly discomfort.  If you are being admitted to the hospital overnight, leave your suitcase in the car. After surgery it may be brought to your room.  In case of increased patient census, it may be necessary for you, the patient, to continue your postoperative care in the Same Day Surgery department.  If you are being discharged the day of surgery, you will not be allowed to drive home. You will need a responsible individual to drive you home and stay with you for 24 hours after surgery.   If you are taking public transportation, you will need to have a responsible individual with you.  Please call the Pre-admissions Testing Dept. at [phone] if you have any questions about these instructions.  Surgery Visitation Policy:  Patients having surgery or a procedure may have two visitors.  Children under the age of 57 must have an adult with them who is not the patient.  Inpatient Visitation:    Visiting hours are 7 a.m. to 8 p.m. Up to four visitors are allowed at one time in a patient room. The visitors may rotate out with other people during the day.  One visitor age 64 or older may stay with the patient overnight and must be in the room by 8 p.m.    Pre-operative 5 CHG Bath Instructions   You can play a key role in reducing the risk of infection after surgery. Your skin needs to be as free of germs as possible. You can reduce the number of germs on your skin by washing with CHG (chlorhexidine gluconate) soap before surgery. CHG is an antiseptic soap that kills germs and continues to kill germs even after washing.   DO NOT use if you have an allergy to chlorhexidine/CHG or  antibacterial soaps. If your skin becomes reddened or irritated, stop using the CHG and notify one of our RNs at [phone].   Please shower with the CHG soap starting 4 days before surgery using the following schedule:     Please keep in mind the following:  DO NOT shave, including legs and underarms, starting the day of your first shower.   You may shave your face at any point before/day of surgery.  Place clean sheets on your bed the day you start using CHG soap. Use a clean washcloth (not used since being washed) for each shower. DO NOT sleep with pets once you start using the CHG.   CHG Shower Instructions:  If you choose to wash your hair and private area, wash first with your normal shampoo/soap.  After you use shampoo/soap, rinse your hair and body thoroughly to remove shampoo/soap residue.  Turn the water OFF and apply about 3 tablespoons (45 ml) of CHG soap to a CLEAN washcloth.  Apply CHG soap ONLY FROM YOUR NECK DOWN TO YOUR TOES (washing for 3-5 minutes)  DO NOT use CHG soap on face, private areas, open wounds, or sores.  Pay special attention to the area where your surgery is being performed.  If you are having back surgery, having someone wash your back for you may be helpful. Wait 2 minutes after CHG soap is applied, then you may rinse off the CHG soap.  Pat dry with a clean towel  Put on clean clothes/pajamas   If you choose to wear lotion, please use ONLY the CHG-compatible lotions on the back of this paper.     Additional instructions for the day of surgery: DO NOT APPLY any lotions, deodorants, cologne, or perfumes.   Put on clean/comfortable clothes.  Brush your teeth.  Ask your nurse before applying any prescription medications to the skin.      CHG Compatible Lotions   Aveeno Moisturizing lotion  Cetaphil Moisturizing Cream  Cetaphil Moisturizing Lotion  Clairol Herbal Essence Moisturizing Lotion, Dry Skin  Clairol Herbal Essence Moisturizing Lotion,  Extra Dry Skin  Clairol Herbal Essence Moisturizing Lotion, Normal Skin  Curel Age Defying Therapeutic Moisturizing Lotion with Alpha Hydroxy  Curel Extreme Care Body Lotion  Curel Soothing Hands Moisturizing Hand Lotion  Curel Therapeutic Moisturizing Cream, Fragrance-Free  Curel Therapeutic Moisturizing Lotion, Fragrance-Free  Curel Therapeutic Moisturizing Lotion, Original Formula  Eucerin Daily Replenishing Lotion  Eucerin Dry Skin Therapy Plus Alpha Hydroxy Crme  Eucerin Dry Skin Therapy Plus Alpha Hydroxy Lotion  Eucerin Original Crme  Eucerin Original Lotion  Eucerin Plus Crme Eucerin Plus Lotion  Eucerin TriLipid Replenishing Lotion  Keri Anti-Bacterial Hand Lotion  Keri Deep Conditioning Original Lotion Dry Skin Formula Softly Scented  Keri Deep Conditioning Original Lotion, Fragrance Free Sensitive Skin Formula  Keri Lotion Fast Absorbing Fragrance Free Sensitive Skin Formula  Keri Lotion Fast Absorbing Softly Scented Dry Skin Formula  Keri Original Lotion  Keri Skin Renewal Lotion Keri Silky Smooth Lotion  Keri Silky Smooth Sensitive Skin Lotion  Nivea Body Creamy Conditioning Oil  Nivea Body Extra Enriched Lotion  Nivea Body Original Lotion  Nivea Body Sheer Moisturizing Lotion Nivea Crme  Nivea Skin Firming Lotion  NutraDerm 30 Skin Lotion  NutraDerm Skin Lotion  NutraDerm Therapeutic Skin Cream  NutraDerm Therapeutic Skin Lotion  ProShield Protective Hand Cream  Provon moisturizing lotion  How to Use an Incentive Spirometer An incentive spirometer is a tool that measures how well you are filling your lungs with each breath. Learning to take long, deep breaths using this tool can help you keep your lungs clear and active. This may help to reverse or lessen your chance of developing breathing (pulmonary) problems, especially infection. You may be asked to use a spirometer: After a surgery. If you have a lung problem or a history of smoking. After a long period  of time when you have been unable to move or be active. If the spirometer includes an indicator to show the highest number that you have reached, your health care provider or respiratory therapist will help you set a goal. Keep a log of your progress as told by your health care provider. What are the risks? Breathing too quickly may cause dizziness or cause you to pass out. Take your time so you do not get dizzy or light-headed. If you are in pain, you may need to take pain medicine before doing incentive spirometry. It is harder to take a deep breath if you are having pain. How to use your incentive spirometer  Sit up on the edge of your bed  or on a chair. Hold the incentive spirometer so that it is in an upright position. Before you use the spirometer, breathe out normally. Place the mouthpiece in your mouth. Make sure your lips are closed tightly around it. Breathe in slowly and as deeply as you can through your mouth, causing the piston or the ball to rise toward the top of the chamber. Hold your breath for 3-5 seconds, or for as long as possible. If the spirometer includes a coach indicator, use this to guide you in breathing. Slow down your breathing if the indicator goes above the marked areas. Remove the mouthpiece from your mouth and breathe out normally. The piston or ball will return to the bottom of the chamber. Rest for a few seconds, then repeat the steps 10 or more times. Take your time and take a few normal breaths between deep breaths so that you do not get dizzy or light-headed. Do this every 1-2 hours when you are awake. If the spirometer includes a goal marker to show the highest number you have reached (best effort), use this as a goal to work toward during each repetition. After each set of 10 deep breaths, cough a few times. This will help to make sure that your lungs are clear. If you have an incision on your chest or abdomen from surgery, place a pillow or a rolled-up  towel firmly against the incision when you cough. This can help to reduce pain while taking deep breaths and coughing. General tips When you are able to get out of bed: Walk around often. Continue to take deep breaths and cough in order to clear your lungs. Keep using the incentive spirometer until your health care provider says it is okay to stop using it. If you have been in the hospital, you may be told to keep using the spirometer at home. Contact a health care provider if: You are having difficulty using the spirometer. You have trouble using the spirometer as often as instructed. Your pain medicine is not giving enough relief for you to use the spirometer as told. You have a fever. Get help right away if: You develop shortness of breath. You develop a cough with bloody mucus from the lungs. You have fluid or blood coming from an incision site after you cough. Summary An incentive spirometer is a tool that can help you learn to take long, deep breaths to keep your lungs clear and active. You may be asked to use a spirometer after a surgery, if you have a lung problem or a history of smoking, or if you have been inactive for a long period of time. Use your incentive spirometer as instructed every 1-2 hours while you are awake. If you have an incision on your chest or abdomen, place a pillow or a rolled-up towel firmly against your incision when you cough. This will help to reduce pain. Get help right away if you have shortness of breath, you cough up bloody mucus, or blood comes from your incision when you cough. This information is not intended to replace advice given to you by your health care provider. Make sure you discuss any questions you have with your health care provider. Document Revised: 09/16/2019 Document Reviewed: 09/16/2019 Elsevier Patient Education  2024 [employer].

## 2023-04-28 NOTE — Plan of Care (Signed)
CHL Tonsillectomy/Adenoidectomy, Postoperative PEDS care plan entered in error.

## 2023-05-03 ENCOUNTER — Inpatient Hospital Stay: Payer: Medicare Other

## 2023-05-05 ENCOUNTER — Ambulatory Visit (INDEPENDENT_AMBULATORY_CARE_PROVIDER_SITE_OTHER): Payer: Medicare Other | Admitting: Family

## 2023-05-05 ENCOUNTER — Ambulatory Visit
Admission: RE | Admit: 2023-05-05 | Discharge: 2023-05-05 | Disposition: A | Discharge status: Home / Self Care | Payer: Medicare Other | Source: Ambulatory Visit | Attending: Family

## 2023-05-05 VITALS — BP 138/82 | HR 53 | Temp 97.5°F | Ht 64.0 in | Wt 243.6 lb

## 2023-05-05 DIAGNOSIS — N6310 Unspecified lump in the right breast, unspecified quadrant: Secondary | ICD-10-CM | POA: Diagnosis not present

## 2023-05-05 DIAGNOSIS — N644 Mastodynia: Secondary | ICD-10-CM | POA: Insufficient documentation

## 2023-05-05 DIAGNOSIS — C50911 Malignant neoplasm of unspecified site of right female breast: Secondary | ICD-10-CM | POA: Diagnosis not present

## 2023-05-05 DIAGNOSIS — Z853 Personal history of malignant neoplasm of breast: Secondary | ICD-10-CM | POA: Insufficient documentation

## 2023-05-05 DIAGNOSIS — Z1239 Encounter for other screening for malignant neoplasm of breast: Secondary | ICD-10-CM | POA: Insufficient documentation

## 2023-05-05 DIAGNOSIS — R92321 Mammographic fibroglandular density, right breast: Secondary | ICD-10-CM | POA: Diagnosis not present

## 2023-05-05 DIAGNOSIS — G2581 Restless legs syndrome: Secondary | ICD-10-CM

## 2023-05-05 DIAGNOSIS — G4733 Obstructive sleep apnea (adult) (pediatric): Secondary | ICD-10-CM | POA: Diagnosis not present

## 2023-05-05 DIAGNOSIS — I10 Essential (primary) hypertension: Secondary | ICD-10-CM | POA: Diagnosis not present

## 2023-05-05 DIAGNOSIS — D649 Anemia, unspecified: Secondary | ICD-10-CM | POA: Diagnosis not present

## 2023-05-05 LAB — BASIC METABOLIC PANEL
BUN: 16 mg/dL (ref 6–23)
CO2: 28 meq/L (ref 19–32)
Calcium: 9 mg/dL (ref 8.4–10.5)
Chloride: 102 meq/L (ref 96–112)
Creatinine, Ser: 1 mg/dL (ref 0.40–1.20)
GFR: 57.12 mL/min — ABNORMAL LOW (ref >60.00)
Glucose, Bld: 86 mg/dL (ref 70–99)
Potassium: 4.5 meq/L (ref 3.5–5.1)
Sodium: 140 meq/L (ref 135–145)

## 2023-05-05 LAB — B12 AND FOLATE PANEL
Folate: 6.7 ng/mL (ref >5.9)
Vitamin B-12: 495 pg/mL (ref 211–911)

## 2023-05-05 MED ORDER — LOSARTAN POTASSIUM-HCTZ 50-12.5 MG PO TABS
1.0000 | ORAL_TABLET | Freq: Every day | ORAL | 0 refills | Status: DC | PRN
Start: 2023-05-05 — End: 2023-06-16

## 2023-05-05 MED ORDER — ROPINIROLE HCL 1 MG PO TABS
1.0000 mg | ORAL_TABLET | Freq: Every day | ORAL | 3 refills | Status: DC
Start: 2023-05-05 — End: 2023-06-16

## 2023-05-05 NOTE — Assessment & Plan Note (Signed)
Tenderness on exam.

## 2023-05-05 NOTE — Progress Notes (Unsigned)
Assessment & Plan:  Mass of right breast, unspecified quadrant Assessment & Plan: Tenderness on exam.   Orders: -     MM 3D DIAGNOSTIC MAMMOGRAM UNILATERAL RIGHT BREAST; Future -     [place] LIMITED ULTRASOUND INCLUDING AXILLA RIGHT BREAST; Future  Restless leg syndrome -     Iron, TIBC and Ferritin Panel -     B12 and Folate Panel -     rOPINIRole HCl; Take 1 tablet (1 mg total) by mouth at bedtime.  Dispense: 90 tablet; Refill: 3  Primary hypertension -     Basic metabolic panel -     Losartan Potassium-HCTZ; Take 1 tablet by mouth daily as needed (for BP 140/80).  Dispense: 90 tablet; Refill: 0  OSA (obstructive sleep apnea) -     Ambulatory referral to Pulmonology     Return precautions given.   Risks, benefits, and alternatives of the medications and treatment plan prescribed today were discussed, and patient expressed understanding.   Education regarding symptom management and diagnosis given to patient on AVS either electronically or printed.  No follow-ups on file.  Rennie Plowman, FNP  Subjective:    Patient ID: Amber Barnes, female    DOB: 11-24-1950, 72 y.o.   MRN: [id number]  CC: Amber Barnes is a 72 y.o. female who presents today for follow up.   HPI: HPI Complains of increased blood pressure , more noticeable at night . BP 100/65 in the evening.  She has had readings  as high 189/ 105 in the morning; she will take if she has a HA in the morning. In half hour, BP had come down.   She is not wearing cipap at night.   She follows a low sodium diet. No sudafed, NSAIDs.   She is compliant with bisoprolol 10 mg BID.   She is no longer taking losartan hydrochlorothiazide 50-12.5 mg every day due to low blood pressure.   She takes lasix 20mg  prn if legs are swollen; she took lasix last night.   Denies CP, SOB, palpitations.   Complains of painful 'knot' in right breast  Compliant with requip 2.5mg  ; with last increase restless legs improved. She  is interested in increasing the dose.    total left  hip arthroplasty planned 05/11/2023  Follow-up Dr. Lalla Brothers 03/15/2023.  Continued on bisoprolol, Eliquis.  She is no longer on flecainide.   Breast biopsy right 01/04/23 -Breast, right, needle core biopsy, 1:00 9 cm fn - FAT NECROSIS. NEGATIVE FOR MALIGNANCY.  Diagnostic bilateral mammogram 12/28/22 without evidence of malignancy in the LEFT breast.    allergies: Hydrocodone, Prednisone, Amiodarone, Hydrocodone-acetaminophen, Tapentadol, Adhesive [tape], Latex, Oxycodone, Tramadol, and Wound dressing adhesive Current Outpatient Medications on File Prior to Visit  Medication Sig Dispense Refill   acetaminophen (TYLENOL) 500 MG tablet Take 500-1,000 mg by mouth every 6 (six) hours as needed for mild pain or moderate pain.     ALPRAZolam (XANAX) 0.25 MG tablet TAKE 1 TABLET BY MOUTH TWICE A DAY AS NEEDED FOR ANXIETY (Patient taking differently: Take 0.25 mg by mouth at bedtime as needed for anxiety or sleep.) 60 tablet 2   bisoprolol (ZEBETA) 10 MG tablet Take 1 tablet (10 mg total) by mouth 2 (two) times daily. 180 tablet 3   busPIRone (BUSPAR) 5 MG tablet TAKE 1 TABLET BY MOUTH THREE TIMES A DAY AS NEEDED (Patient taking differently: Take 5 mg by mouth 2 (two) times daily.) 60 tablet 1   Cholecalciferol (VITAMIN D3) 5000 units CAPS Take  5,000 Units by mouth daily.     diphenhydrAMINE (BENADRYL) 25 mg capsule Take 1 capsule (25 mg total) by mouth every 8 (eight) hours as needed for itching or allergies. 30 capsule 0   ELIQUIS 5 MG TABS tablet TAKE 1 TABLET BY MOUTH TWICE A DAY 180 tablet 1   escitalopram (LEXAPRO) 10 MG tablet TAKE 1 TABLET BY MOUTH EVERY DAY 90 tablet 1   furosemide (LASIX) 20 MG tablet Take 1 tablet (20 mg total) by mouth daily. May take additional 20mg  PO prn for leg swelling 180 tablet 3   metFORMIN (GLUCOPHAGE-XR) 500 MG 24 hr tablet Take by mouth.     Multiple Vitamin (MULTIVITAMIN) tablet Take 1 tablet by mouth daily.      nystatin cream (MYCOSTATIN) Apply 1 application. topically 2 (two) times daily. Prn abdomen (Patient taking differently: Apply 1 application  topically 2 (two) times daily as needed for dry skin. Prn abdomen) 60 g 5   ondansetron (ZOFRAN) 8 MG tablet Take 1 tablet (8 mg total) by mouth every 8 (eight) hours as needed. 60 tablet 1   potassium chloride SA (KLOR-CON M) 20 MEQ tablet Take 1 tablet (20 mEq total) by mouth daily. (Patient taking differently: Take 20 mEq by mouth every morning. And if she takes additional Lasix she will take another K+) 90 tablet 3   ranolazine (RANEXA) 500 MG 12 hr tablet TAKE 1 TABLET BY MOUTH TWICE A DAY 180 tablet 2   rOPINIRole (REQUIP) 2 MG tablet TAKE 1 TABLET BY MOUTH EVERYDAY AT BEDTIME 90 tablet 3   rosuvastatin (CRESTOR) 40 MG tablet TAKE 1 TABLET BY MOUTH EVERY DAY 90 tablet 3   [DISCONTINUED] prochlorperazine (COMPAZINE) 10 MG tablet Take 1 tablet (10 mg total) by mouth every 6 (six) hours as needed (Nausea or vomiting). 30 tablet 1   No current facility-administered medications on file prior to visit.    Review of Systems    Objective:    BP 138/82   Pulse (!) 53   Temp (!) 97.5 F (36.4 C) (Oral)   Ht 5\' 4"  (1.626 m)   Wt 243 lb 9.6 oz (110.5 kg)   SpO2 98%   BMI 41.81 kg/m  BP Readings from Last 3 Encounters:  05/05/23 138/82  04/28/23 139/69  03/15/23 106/60   Wt Readings from Last 3 Encounters:  05/05/23 243 lb 9.6 oz (110.5 kg)  04/28/23 243 lb 6.2 oz (110.4 kg)  03/15/23 241 lb 6 oz (109.5 kg)    Physical Exam Vitals reviewed.  Constitutional:      Appearance: She is well-developed.  Eyes:     Conjunctiva/sclera: Conjunctivae normal.  Cardiovascular:     Rate and Rhythm: Normal rate and regular rhythm.     Pulses: Normal pulses.     Heart sounds: Normal heart sounds.  Pulmonary:     Effort: Pulmonary effort is normal.     Breath sounds: Normal breath sounds. No wheezing, rhonchi or rales.  Chest:     Chest wall: No  mass.  Breasts:    Right: Tenderness present. No swelling, inverted nipple, nipple discharge or skin change.     Left: No swelling, inverted nipple, nipple discharge, skin change or tenderness.       Comments: Tenderness right medial breast as noted on diagram, approx 12 oclock.  No palpable mass.  Skin:    General: Skin is warm and dry.  Neurological:     Mental Status: She is alert.  Psychiatric:  Speech: Speech normal.        Behavior: Behavior normal.        Thought Content: Thought content normal.

## 2023-05-05 NOTE — Patient Instructions (Signed)
Increase Requip to 3 mg at bedtime. For blood pressure you may take losartan hydrochlorothiazide as needed for blood pressure greater than 140/80.  I refilled this medication today. Referral to pulmonology for retesting of sleep apnea I have ordered right breast diagnostic images  Let us know if you dont hear back within a week in regards to an appointment being scheduled.   So that you are aware, if you are [facility] MyChart user , please pay attention to your MyChart messages as you may receive a MyChart message with a phone number to call and schedule this test/appointment own your own from our referral coordinator. This is a new process so I do not want you to miss this message.  If you are not a MyChart user, you will receive a phone call.

## 2023-05-06 LAB — IRON,TIBC AND FERRITIN PANEL
%SAT: 24 % (ref 16–45)
Ferritin: 77 ng/mL (ref 16–288)
Iron: 82 ug/dL (ref 45–160)
TIBC: 340 ug/dL (ref 250–450)

## 2023-05-09 ENCOUNTER — Encounter: Payer: Self-pay | Admitting: Orthopedic Surgery

## 2023-05-09 NOTE — Progress Notes (Signed)
Perioperative / Anesthesia Services  Pre-Admission Testing Clinical Review / Pre-Operative Anesthesia Consult  Date: 05/10/23  Patient Demographics:  Name: Amber Barnes DOB:   Nov 26, 1950 MRN:   [id number]  Planned Surgical Procedure(s):    Case: [id number] Date/Time: 05/11/23 0715   Procedure: TOTAL HIP ARTHROPLASTY ANTERIOR APPROACH (Left: Hip)   Anesthesia type: Choice   Pre-op diagnosis:      Primary osteoarthritis of left hip M16.12     Left hip pain M25.552   Location: [facility] OR ROOM 01 / [facility]   Surgeons: Reinaldo Berber, MD     NOTE: Available PAT nursing documentation and vital signs have been reviewed. Clinical nursing staff has updated patient's PMH/PSHx, current medication list, and drug allergies/intolerances to ensure comprehensive history available to assist in medical decision making as it pertains to the aforementioned surgical procedure and anticipated anesthetic course. Extensive review of available clinical information personally performed. Amber Barnes PMH and PSHx updated with any diagnoses/procedures that  may have been inadvertently omitted during her intake with the pre-admission testing department's nursing staff.  Clinical Discussion:  Amber Barnes is a 72 y.o. female who is submitted for pre-surgical anesthesia review and clearance prior to her undergoing the above procedure. Patient is a Former Smoker (2.5 pack years; quit 07/2003). Pertinent PMH includes: atrial fibrillation/flutter (s/p ablation), HFmrEF, aortic atherosclerosis, ascending aorta dilatation, HTN, HLD, hypothyroidism (s/p partial thyroidectomy), COPD, OSAH (non-compliant with nocturnal PAP therapy), GERD (no daily Tx), CKD-II, PUD, anemia, breast cancer, OA, chronic lower back pain, nephrolithiasis, depression, anxiety (on BZO), insomnia, RLS.   Patient is followed by cardiology Amber Kindle, MD). She was last seen in the cardiology clinic on 03/15/2023; notes  reviewed. At the time of her clinic visit, patient doing well overall from a cardiovascular perspective. Patient denied any chest pain, shortness of breath, PND, orthopnea, palpitations, significant peripheral edema, weakness, fatigue, vertiginous symptoms, or presyncope/syncope. Patient with a past medical history significant for cardiovascular diagnoses. Documented physical exam was grossly benign, providing no evidence of acute exacerbation and/or decompensation of the patient's known cardiovascular conditions.  Patient diagnosed with atrial fibrillation in January 2018.   Patient underwent DCCV procedure on 09/07/2016, at which time she received multiple synchronized cardioversions.  150 J x 2 converted patient to atrial flutter.  Subsequent 200 J x 1 cardioversion restored NSR..  Due to recurrent atrial arrhythmia, patient underwent second DCCV procedure on 10/19/2016, at which time she received a single 150 J cardioversion, which restored NSR.  Again, due to recurrent atrial arrhythmia, patient was brought in for a third DCCV procedure in June 2018.  Upon arrival, patient was noted to be in NSR, therefore procedure was aborted.  Patient ultimately underwent PVI ablation procedure on 12/12/2022.  Since procedure, patient has had no recurrence of her atrial arrhythmia.  Most recent complete TTE was performed on 10/20/2022 revealing a mildly reduced left ventricular systolic function with an EF of 45-50%.  There was global hypokinesis.  Left ventricular diastolic Doppler parameters indeterminate.  GLS -8.0%.  Right ventricular size was normal with moderately reduced systolic function.  RVSP 28.0 mmHg.  Left atrium mildly dilated.  There was mild to moderate mitral and tricuspid valve regurgitation.  All transvalvular gradients were noted to be normal providing no evidence suggestive of valvular stenosis.  Ascending aorta borderline dilated at 39 mm.  Most recent myocardial perfusion imaging study  performed on 11/01/2022 revealed a mildly reduced left ventricular systolic function with an EF of 52%.  There was subtle  apical anterior and apical septal hypokinesis noted with stress.  Maximum heart rate 99 bpm, which was 63% of her MPHR.  There was a moderate in size, moderate to severe in severity, partially reversible defect involving the apical anterior, apical septal, and apical segments.  Most consistent with ischemia and possible element of scar, however artifact cannot be completely ruled out.  CT attenuation correction images showed no significant coronary artery calcification, though aortic atherosclerosis, aortic valve, and mitral annular calcifications were noted.  Study was abnormal and determined to be intermediate risk.  Coronary CTA was performed on 12/08/2022 that demonstrated an Agatston coronary artery calcium score of 31.6. This placed patient in the 64 percentile for age, sex, and race matched controls patient with a windsock cactus type LAA with an area of 47 mm.  There was no LAA thrombus noted.  No significant extracardiac findings demonstrated.  Follow-up limited TTE status post cardiac ablation performed on 12/13/2022.  EF had improved to 50-55%.  There were no regional wall motion abnormalities.  Mild LVH noted.  There was moderate mitral annular calcification.  Again, patient with a known diagnosis of atrial fibrillation; CHA2DS2-VASc Score = 5 (age, sex, HFmrEF, HTN, vascular disease history).  Cardiac rate and rhythm was being maintained on oral bisoprolol + flecainide.  Patient is chronically anticoagulated using standard dose apixaban.  Patient reported be compliant with DOAC therapy with no evidence or reports of GI/GU related bleeding.  Blood pressure well-controlled at 120/76 mmHg on currently prescribed beta-blocker (bisoprolol), diuretic (furosemide + HCTZ) and ARB (losartan) therapies.  Patient is on ranolazine for recurrent chest pain.  She is on rosuvastatin for HLD  diagnosis and further ASCVD prevention.  Patient is not diabetic.  She does have an OSAH diagnosis, however patient is reportedly noncompliant with prescribed nocturnal PAP therapy. Patient is able to complete all of her  ADL/IADLs without cardiovascular limitation.  Per the DASI, patient is able to achieve at least 4 METS of physical activity without experiencing any significant degree of angina/anginal equivalent symptoms.  Flecainide discontinued.  No other changes were made to her medication regimen.  Patient to follow-up with outpatient cardiology in 6 months or sooner if needed.  Amber Barnes is scheduled for an elective TOTAL HIP ARTHROPLASTY ANTERIOR APPROACH (Left: Hip) on 05/11/2023 with Dr. Reinaldo Berber, MD.  Given patient's past medical history significant for cardiovascular diagnoses, presurgical cardiac clearance was sought by the PAT team.  Per cardiology, "Ms. Erkkila's perioperative risk of a major cardiac event is 0.4% according to the Revised Cardiac Risk Index (RCRI).  Therefore, she is at LOW risk for perioperative complications.  According to ACC/AHA guidelines, no further cardiovascular testing needed.  The patient may proceed to surgery at ACCEPTABLE risk."  Again, this patient is on daily oral anticoagulation therapy using a DOAC medication. She has been instructed on recommendations for holding her apixaban for 3 days prior to her procedure with plans to restart as soon as postoperative bleeding risk felt to be minimized by her attending surgeon. The patient has been instructed that her last dose of her apixaban should be on 05/08/2023.  Patient reports previous perioperative complications with anesthesia in the past. PMH (+) for hypoxia and emergence delirium with general anesthesia. In review of the available records, it is noted that patient underwent a general anesthetic course at [facility] in 12/2022 without documented complications.       05/05/2023     9:11 AM 04/28/2023   10:17 AM  03/15/2023   11:44 AM  Vitals with BMI  Height 5\' 4"  5\' 5"  5\' 5"   Weight 243 lbs 10 oz 243 lbs 6 oz 241 lbs 6 oz  BMI 41.79 40.5 40.17  Systolic 138 139 725  Diastolic 82 69 60  Pulse 53 52 56    Providers/Specialists:   NOTE: Primary physician provider listed below. Patient may have been seen by APP or partner within same practice.   PROVIDER ROLE / SPECIALTY LAST Wilber Bihari, MD Orthopedics (Surgeon) 04/26/2023  Allegra Grana, FNP Primary Care Provider 05/05/2023  Julien Nordmann, MD Cardiology 02/07/2023; update preop APP call on 03/31/2023  Steffanie Dunn, MD Electrophysiology 03/15/2023  [name], MD Medical Oncology 02/14/2023   Allergies:  Hydrocodone, Prednisone, Amiodarone, Hydrocodone-acetaminophen, Tapentadol, Adhesive [tape], Latex, Oxycodone, Tramadol, and Wound dressing adhesive  Current Home Medications:   No current facility-administered medications for this encounter.    acetaminophen (TYLENOL) 500 MG tablet   ALPRAZolam (XANAX) 0.25 MG tablet   bisoprolol (ZEBETA) 10 MG tablet   busPIRone (BUSPAR) 5 MG tablet   Cholecalciferol (VITAMIN D3) 5000 units CAPS   diphenhydrAMINE (BENADRYL) 25 mg capsule   ELIQUIS 5 MG TABS tablet   escitalopram (LEXAPRO) 10 MG tablet   furosemide (LASIX) 20 MG tablet   losartan-hydrochlorothiazide (HYZAAR) 50-12.5 MG tablet   metFORMIN (GLUCOPHAGE-XR) 500 MG 24 hr tablet   Multiple Vitamin (MULTIVITAMIN) tablet   nystatin cream (MYCOSTATIN)   ondansetron (ZOFRAN) 8 MG tablet   potassium chloride SA (KLOR-CON M) 20 MEQ tablet   ranolazine (RANEXA) 500 MG 12 hr tablet   rOPINIRole (REQUIP) 1 MG tablet   rOPINIRole (REQUIP) 2 MG tablet   rosuvastatin (CRESTOR) 40 MG tablet   History:   Past Medical History:  Diagnosis Date   Achilles tendinitis    Acute medial meniscal tear    Allergy    Anemia    Anxiety    a.) on BZO PRN (alprazolam)   Aortic atherosclerosis (HCC)     Ascending aorta dilatation (HCC) 10/20/2022   a.) TTE 10/20/2022: asc Ao 39 mm   Atrial fibrillation/flutter (HCC) 07/2016   a.) Dx'd 07/2016; b.) CHA2DS2-VASc = 5 (age, sex, HFmrEF, HTN, vasc disease history) as of 05/09/2023; c.) s/p DCCV 09/07/2016 (150 J x 2 --> A.flutter; 200 J x 1 --> NSR); d.) s/p DCCV 10/19/2016 (150 J x 1 --> NSR); e.) brought in for DCCV 12/2016; cancelled d/t NSR; f.) s/p PVI ablation 12/12/2022; g.) cardiac rate/rhythm maintained on oral bisoprolol; chronically anticoagulated using apixaban   CAD (coronary artery disease)    a.) cCTA 12/08/2022: Ca2+ = 31.6 (58th %'ile for age/sex/race match control)   Chemotherapy induced nausea and vomiting 05/18/2021   Chronic heart failure with mid-range ejection fraction (HFmrEF) (HCC)    a.) TTE 03/08/21: EF 60-65, mild LVH, G1DD, mild LAE, triv MR; b.) TTE 07/29/21: EF 60-65, G1DD, mild LAE, triv MR; c.) TTE 11/26/21: EF 55-60, G1DD, mild LAE, mild MR; d.) TTE 02/25/22: EF 60-65, G1DD, mild LAE, mild MR; e.) TTE 05/31/22: EF 55-60, mild LVH, G2DD, mild-mod MR; f.) TTE 10/20/22: EF 45-50, mild LAE, mod red RVSF, mild-mod TR, Asc Ao 39; g.) TTE 12/13/22: EF 50-55, mild LVH, mild MAC   CKD (chronic kidney disease), stage II    Colon polyp    Complication of anesthesia    a.) emergence delirium; b.) hypoxia after hip replacement   COPD (chronic obstructive pulmonary disease) (HCC)    DDD (degenerative disc disease), lumbar  a.) s/p fusion   Depression    Endometriosis    Fibrocystic breast disease    GERD (gastroesophageal reflux disease)    History of stress test    a.) MV 07/29/2016: EF 55-65%. Small, mild defect  in mid anteroseptal, apical septal, and apical locations. No ischemia-->low risk; b.) MV 10/2022: mod sized partially rev apical ant, apical septal, and apical defect. EF 52%. No signific cor Ca2+; c.) cCTA 12/08/2022: Ca2+ = 31.6 (58th %'ile).   History of tobacco use    Hyperlipidemia    Hypertension     Hypothyroidism    a.) s/p partial thyroidectomy for nodule; pathology benign   Insomnia    Lipoma    Malignant neoplasm of upper-outer quadrant of right female breast (HCC) 03/03/2021   a.) stage 1A invasive mammary carcinoma (cT1b or T1c N0); ER (-), PR (+), HER2/neu (+); did not tolerate adjuvant chemotherapy (paclitacel + trastuzumab) despite paclitaxel dose reduction; s/p adjuvant XRT; cinished maintenance trastuzumab 07/27/2022; no adjuvant endocrine therapy as no clear benefit   Migraine    Nephrolithiasis    On apixaban therapy    OSA (obstructive sleep apnea)    a.) non-compliant with nocturnal PAP therapy   Osteoarthritis    Osteopenia    Peptic ulcer disease    a.) H. pylori   Pericarditis 12/12/2022   a.) following A.fib ablation; resolved   Pneumonia    Port-A-Cath in place    RLS (restless legs syndrome)    a.) on ropinirole   Vitamin D deficiency    Past Surgical History:  Procedure Laterality Date   ABDOMINAL HYSTERECTOMY     ovaries left   ACHILLES TENDON SURGERY     2012,01/2017   ATRIAL FIBRILLATION ABLATION N/A 12/12/2022   Procedure: ATRIAL FIBRILLATION ABLATION;  Surgeon: Lanier Prude, MD;  Location: [facility];  Service: Cardiovascular;  Laterality: N/A;   BREAST BIOPSY Right 01/15/2021   Affirm biopsy-"X" clip-INVASIVE MAMMARY CARCINOMA   BREAST BIOPSY Right 01/15/2021   u/s bx-"venus" clip INVASIVE MAMMARY CARCINOMA   BREAST BIOPSY     BREAST BIOPSY Right 01/04/2023   u/s bx ribbon clip path pending   BREAST BIOPSY Right 01/04/2023   [place] RT BREAST BX W LOC DEV 1ST LESION IMG BX SPEC US GUIDE 01/04/2023 [facility]   BREAST LUMPECTOMY,RADIO FREQ LOCALIZER,AXILLARY SENTINEL LYMPH NODE BIOPSY Right 02/17/2021   Procedure: BREAST LUMPECTOMY,RADIO FREQ LOCALIZER,AXILLARY SENTINEL LYMPH NODE BIOPSY;  Surgeon: Campbell Lerner, MD;  Location: [facility] ORS;  Service: General;  Laterality: Right;   CARDIOVERSION N/A 09/07/2016   Procedure:  CARDIOVERSION;  Surgeon: Antonieta Iba, MD;  Location: [facility]S;  Service: Cardiovascular;  Laterality: N/A;   CARDIOVERSION N/A 10/19/2016   Procedure: CARDIOVERSION;  Surgeon: Antonieta Iba, MD;  Location: [facility]S;  Service: Cardiovascular;  Laterality: N/A;   CARDIOVERSION N/A 12/14/2016   Procedure: CARDIOVERSION;  Surgeon: Antonieta Iba, MD;  Location: [facility]S;  Service: Cardiovascular;  Laterality: N/A;   COLONOSCOPY     COLONOSCOPY WITH PROPOFOL N/A 05/13/2020   Procedure: COLONOSCOPY WITH PROPOFOL;  Surgeon: Toledo, Boykin Nearing, MD;  Location: [facility];  Service: Gastroenterology;  Laterality: N/A;  ELIQUIS   CYSTOSCOPY WITH STENT PLACEMENT Bilateral 07/12/2021   Procedure: CYSTOSCOPY WITH STENT PLACEMENT;  Surgeon: Sondra Come, MD;  Location: [facility] ORS;  Service: Urology;  Laterality: Bilateral;   CYSTOSCOPY/URETEROSCOPY/HOLMIUM LASER/STENT PLACEMENT Bilateral 07/30/2021   Procedure: CYSTOSCOPY/URETEROSCOPY/HOLMIUM LASER/BILATERAL STENT REMOVAL;  Surgeon: Sondra Come, MD;  Location: [facility] ORS;  Service: Urology;  Laterality: Bilateral;   ESOPHAGOGASTRODUODENOSCOPY     ESOPHAGOGASTRODUODENOSCOPY (EGD) WITH PROPOFOL N/A 05/13/2020   Procedure: ESOPHAGOGASTRODUODENOSCOPY (EGD) WITH PROPOFOL;  Surgeon: Toledo, Boykin Nearing, MD;  Location: [facility];  Service: Gastroenterology;  Laterality: N/A;   FOOT SURGERY Left    tendon   LUMBAR FUSION     PORTA CATH INSERTION N/A 03/23/2021   Procedure: PORTA CATH INSERTION;  Surgeon: Renford Dills, MD;  Location: [facility];  Service: Cardiovascular;  Laterality: N/A;   PORTACATH PLACEMENT N/A 03/23/2021   Procedure: ATTEMPTED INSERTION PORT-A-CATH;  Surgeon: Campbell Lerner, MD;  Location: [facility] ORS;  Service: General;  Laterality: N/A;   THYROIDECTOMY  1990   half thyroid lobectomy secondary to a benign nodule   TOTAL HIP ARTHROPLASTY Right 08/21/2018   Procedure: TOTAL HIP ARTHROPLASTY ANTERIOR  APPROACH-RIGHT CELL SAVER REQUESTED;  Surgeon: Kennedy Bucker, MD;  Location: [facility] ORS;  Service: Orthopedics;  Laterality: Right;   Family History  Problem Relation Age of Onset   Cancer Mother        leukemia   Cancer Father 36       colon   Heart disease Father    Heart attack Father 41       died from MI   Hyperlipidemia Father    Hypertension Father    Cancer Sister 81       ovarian   Depression Sister    Cancer Brother        CLL   Breast cancer Paternal Aunt        79's   Migraines Neg Hx    Thyroid cancer Neg Hx    Social History   Tobacco Use   Smoking status: Former    Current packs/day: 0.00    Average packs/day: 0.3 packs/day for 10.0 years (2.5 ttl pk-yrs)    Types: Cigarettes    Start date: 57    Quit date: 2005    Years since quitting: 19.8   Smokeless tobacco: Never   Tobacco comments:    smoked for 10 years, 1 pack every 2-3 days  Vaping Use   Vaping status: Never Used  Substance Use Topics   Alcohol use: Yes    Alcohol/week: 1.0 standard drink of alcohol    Types: 1 Glasses of wine per week    Comment: margarita qd   Drug use: Yes    Types: Marijuana    Pertinent Clinical Results:  LABS:   Lab Results  Component Value Date   WBC 4.9 02/14/2023   HGB 13.4 02/14/2023   HCT 40.0 02/14/2023   MCV 94.1 02/14/2023   PLT 153 02/14/2023   Lab Results  Component Value Date   NA 140 05/05/2023   K 4.5 05/05/2023   CO2 28 05/05/2023   GLUCOSE 86 05/05/2023   BUN 16 05/05/2023   CREATININE 1.00 05/05/2023   CALCIUM 9.0 05/05/2023   GFR 57.12 (L) 05/05/2023   GFRNONAA >60 04/28/2023    ECG: Date: 04/14/2023 Time ECG obtained: 1149 AM Rate: 56 bpm Rhythm:  Sinus bradycardia with sinus arrhythmia with first-degree AV block Axis (leads I and aVF): Normal Intervals: PR 244 ms. QRS 106 ms. QTc 449 ms. ST segment and T wave changes: No evidence of acute ST segment elevation or depression.   Comparison: Similar to previous tracing obtained  on 02/07/2023   IMAGING / PROCEDURES: ECHOCARDIOGRAM LIMITED performed on 12/13/2022 Left ventricular ejection fraction, by estimation, is 50 to 55%. The left ventricle has  low normal function. The left ventricle has no regional wall motion abnormalities. There is mild left ventricular hypertrophy.  Moderate mitral annular calcification.  The inferior vena cava is dilated in size with <50% respiratory variability, suggesting right atrial pressure of 15 mmHg.   CT CARDIAC MORPH/PULM VEIN W/CM&W/O CA SCORE performed on 12/08/2022 There is normal pulmonary vein drainage into the left atrium. (3 on the right and 2 on the left) The left atrial appendage is a Windsock-cactus type with ostial size 28 x 23 mm and length 32 mm, Area 47 mm2. There is no thrombus in the left atrial appendage. The esophagus runs in the left atrial midline and is not in the proximity to any of the pulmonary veins. Coronary calcium score 31.6. This was 58th percentile for age and gender matched controls. No significant extracardiac findings within the visualized chest.   MYOCARDIAL PERFUSION IMAGING STUDY (LEXISCAN) performed on 11/01/2022 There is a moderate in size, moderate to severe in severity he partially reversible defect involving the apical anterior, apical septal, and apical segments.  This is most consistent with ischemia and possible element of scar but cannot rule out artifact. Left ventricular systolic function is mildly reduced (LVEF 52% by Siemens calculation, 43% by QGS).  There is subtle hypokinesis of the apical septal Micra. No significant coronary artery calcification is noted, though aortic atherosclerosis and aortic valve and mitral annular calcifications are noted. This is an intermediate risk study.  ECHOCARDIOGRAM COMPLETE performed on 10/20/2022 Left ventricular ejection fraction, by estimation, is 45 to 50%. Left ventricular ejection fraction by 3D volume is 44 %. The left ventricle has mildly  decreased function. The left ventricle demonstrates global hypokinesis. Left ventricular diastolic parameters are indeterminate. The average left ventricular global  longitudinal strain is -8.0 %. Right ventricular systolic function is moderately reduced. The right ventricular size is normal. There is normal pulmonary artery systolic pressure. The estimated right ventricular systolic pressure is 28.0 mmHg.  Left atrial size was mildly dilated.  The mitral valve is normal in structure. Mild mitral valve regurgitation. No evidence of mitral stenosis.  Tricuspid valve regurgitation is mild to moderate.  The aortic valve has an indeterminant number of cusps. Aortic valve regurgitation is not visualized. No aortic stenosis is present.  There is borderline dilatation of the ascending aorta, measuring 39 mm.  The inferior vena cava is normal in size with greater than 50% respiratory variability, suggesting right atrial pressure of 3 mmHg.   EXERCISE TOLERANCE TEST performed on 06/14/2022 No ST deviation was noted. Prior study not available for comparison. Inconclusive stress test due to lack of heart rate augmentation.    Impression and Plan:  Amber Barnes has been referred for pre-anesthesia review and clearance prior to her undergoing the planned anesthetic and procedural courses. Available labs, pertinent testing, and imaging results were personally reviewed by me in preparation for upcoming operative/procedural course. [name] medical record has been updated following extensive record review and patient interview with PAT staff.   This patient has been appropriately cleared by cardiology with an overall LOW/ACCEPTABLE risk of experiencing significant perioperative cardiovascular complications. Based on clinical review performed today (05/10/23), barring any significant acute changes in the patient's overall condition, it is anticipated that she will be able to proceed with the planned surgical  intervention. Any acute changes in clinical condition may necessitate her procedure being postponed and/or cancelled. Patient will meet with anesthesia team (MD and/or CRNA) on the day of her procedure for preoperative evaluation/assessment. Questions regarding  anesthetic course will be fielded at that time.   Pre-surgical instructions were reviewed with the patient during her PAT appointment, and questions were fielded to satisfaction by PAT clinical staff. She has been instructed on which medications that she will need to hold prior to surgery, as well as the ones that have been deemed safe/appropriate to take on the day of her procedure. As part of the general education provided by PAT, patient made aware both verbally and in writing, that she would need to abstain from the use of any illegal substances during her perioperative course.  She was advised that failure to follow the provided instructions could necessitate case cancellation or result in serious perioperative complications up to and including death. Patient encouraged to contact PAT and/or her surgeon's office to discuss any questions or concerns that may arise prior to surgery; verbalized understanding.   Quentin Mulling, MSN, APRN, FNP-C, CEN [facility]  Perioperative Services Nurse Practitioner Phone: [phone] Fax: [phone] 05/10/23 12:24 PM  NOTE: This note has been prepared using Dragon dictation software. Despite my best ability to proofread, there is always the potential that unintentional transcriptional errors may still occur from this process.

## 2023-05-10 ENCOUNTER — Encounter: Payer: Self-pay | Admitting: Orthopedic Surgery

## 2023-05-11 ENCOUNTER — Other Ambulatory Visit: Payer: Self-pay

## 2023-05-11 ENCOUNTER — Encounter: Payer: Self-pay | Admitting: Orthopedic Surgery

## 2023-05-11 ENCOUNTER — Observation Stay
Admission: RE | Admit: 2023-05-11 | Discharge: 2023-05-12 | Disposition: A | Discharge status: Home Health | Payer: Medicare Other | Source: Ambulatory Visit | Attending: Orthopedic Surgery | Admitting: Orthopedic Surgery

## 2023-05-11 ENCOUNTER — Ambulatory Visit: Payer: Medicare Other | Admitting: Urgent Care

## 2023-05-11 ENCOUNTER — Encounter
Admission: RE | Disposition: A | Discharge status: Home Health | Payer: Self-pay | Source: Ambulatory Visit | Attending: Orthopedic Surgery

## 2023-05-11 ENCOUNTER — Ambulatory Visit: Payer: Medicare Other

## 2023-05-11 DIAGNOSIS — Z96642 Presence of left artificial hip joint: Principal | ICD-10-CM | POA: Diagnosis present

## 2023-05-11 DIAGNOSIS — E039 Hypothyroidism, unspecified: Secondary | ICD-10-CM | POA: Insufficient documentation

## 2023-05-11 DIAGNOSIS — N182 Chronic kidney disease, stage 2 (mild): Secondary | ICD-10-CM | POA: Insufficient documentation

## 2023-05-11 DIAGNOSIS — M1612 Unilateral primary osteoarthritis, left hip: Principal | ICD-10-CM | POA: Insufficient documentation

## 2023-05-11 DIAGNOSIS — Z8616 Personal history of COVID-19: Secondary | ICD-10-CM | POA: Diagnosis not present

## 2023-05-11 DIAGNOSIS — I5032 Chronic diastolic (congestive) heart failure: Secondary | ICD-10-CM | POA: Insufficient documentation

## 2023-05-11 DIAGNOSIS — J449 Chronic obstructive pulmonary disease, unspecified: Secondary | ICD-10-CM | POA: Diagnosis not present

## 2023-05-11 DIAGNOSIS — I2510 Atherosclerotic heart disease of native coronary artery without angina pectoris: Secondary | ICD-10-CM | POA: Insufficient documentation

## 2023-05-11 DIAGNOSIS — I130 Hypertensive heart and chronic kidney disease with heart failure and stage 1 through stage 4 chronic kidney disease, or unspecified chronic kidney disease: Secondary | ICD-10-CM | POA: Insufficient documentation

## 2023-05-11 DIAGNOSIS — Z79899 Other long term (current) drug therapy: Secondary | ICD-10-CM | POA: Insufficient documentation

## 2023-05-11 DIAGNOSIS — Z853 Personal history of malignant neoplasm of breast: Secondary | ICD-10-CM | POA: Diagnosis not present

## 2023-05-11 DIAGNOSIS — I4891 Unspecified atrial fibrillation: Secondary | ICD-10-CM | POA: Diagnosis not present

## 2023-05-11 DIAGNOSIS — Z85828 Personal history of other malignant neoplasm of skin: Secondary | ICD-10-CM | POA: Insufficient documentation

## 2023-05-11 DIAGNOSIS — Z96643 Presence of artificial hip joint, bilateral: Secondary | ICD-10-CM | POA: Diagnosis not present

## 2023-05-11 DIAGNOSIS — Z7901 Long term (current) use of anticoagulants: Secondary | ICD-10-CM | POA: Insufficient documentation

## 2023-05-11 DIAGNOSIS — I480 Paroxysmal atrial fibrillation: Secondary | ICD-10-CM | POA: Diagnosis not present

## 2023-05-11 DIAGNOSIS — Z96641 Presence of right artificial hip joint: Secondary | ICD-10-CM | POA: Diagnosis not present

## 2023-05-11 HISTORY — DX: Long term (current) use of anticoagulants: Z79.01

## 2023-05-11 HISTORY — DX: Atherosclerosis of aorta: I70.0

## 2023-05-11 HISTORY — DX: Other intervertebral disc degeneration, lumbar region without mention of lumbar back pain or lower extremity pain: M51.369

## 2023-05-11 HISTORY — DX: Atherosclerotic heart disease of native coronary artery without angina pectoris: I25.10

## 2023-05-11 HISTORY — PX: TOTAL HIP ARTHROPLASTY: SHX124

## 2023-05-11 HISTORY — DX: Presence of left artificial hip joint: Z96.642

## 2023-05-11 HISTORY — DX: Hypothyroidism, unspecified: E03.9

## 2023-05-11 SURGERY — ARTHROPLASTY, HIP, TOTAL, ANTERIOR APPROACH
Anesthesia: General | Site: Hip | Laterality: Left

## 2023-05-11 MED ORDER — METOCLOPRAMIDE HCL 5 MG/ML IJ SOLN: 5.0000 mg | Freq: Three times a day (TID) | INTRAMUSCULAR | Status: DC | PRN

## 2023-05-11 MED ORDER — CEFAZOLIN SODIUM-DEXTROSE 2-4 GM/100ML-% IV SOLN
INTRAVENOUS | Status: AC
Start: 2023-05-11 — End: ?
  Filled 2023-05-11: qty 100

## 2023-05-11 MED ORDER — DOCUSATE SODIUM 100 MG PO CAPS
ORAL_CAPSULE | ORAL | Status: AC
  Filled 2023-05-11: qty 1

## 2023-05-11 MED ORDER — APIXABAN 2.5 MG PO TABS
5.0000 mg | ORAL_TABLET | Freq: Two times a day (BID) | ORAL | Status: DC
  Administered 2023-05-12: 5 mg via ORAL

## 2023-05-11 MED ORDER — ACETAMINOPHEN 10 MG/ML IV SOLN
INTRAVENOUS | Status: AC
  Filled 2023-05-11: qty 100

## 2023-05-11 MED ORDER — TRANEXAMIC ACID-NACL 1000-0.7 MG/100ML-% IV SOLN: 1000.0000 mg | INTRAVENOUS | Status: DC

## 2023-05-11 MED ORDER — DIPHENHYDRAMINE HCL 50 MG/ML IJ SOLN: 12.5000 mg | Freq: Once | INTRAMUSCULAR | Status: DC | PRN

## 2023-05-11 MED ORDER — CEFAZOLIN SODIUM-DEXTROSE 2-4 GM/100ML-% IV SOLN
2.0000 g | INTRAVENOUS | Status: AC
  Administered 2023-05-11: 2 g via INTRAVENOUS

## 2023-05-11 MED ORDER — DOCUSATE SODIUM 100 MG PO CAPS
100.0000 mg | ORAL_CAPSULE | Freq: Two times a day (BID) | ORAL | Status: DC
  Administered 2023-05-11 – 2023-05-12 (×2): 100 mg via ORAL

## 2023-05-11 MED ORDER — ONDANSETRON HCL 4 MG/2ML IJ SOLN
INTRAMUSCULAR | Status: AC
Start: 2023-05-11 — End: ?
  Filled 2023-05-11: qty 2

## 2023-05-11 MED ORDER — METOCLOPRAMIDE HCL 5 MG PO TABS: 5.0000 mg | ORAL_TABLET | Freq: Three times a day (TID) | ORAL | Status: DC | PRN

## 2023-05-11 MED ORDER — TRAMADOL HCL 50 MG PO TABS
50.0000 mg | ORAL_TABLET | Freq: Four times a day (QID) | ORAL | Status: DC | PRN
  Administered 2023-05-11 (×2): 50 mg via ORAL

## 2023-05-11 MED ORDER — 0.9 % SODIUM CHLORIDE (POUR BTL) OPTIME
TOPICAL | Status: DC | PRN
  Administered 2023-05-11: 500 mL

## 2023-05-11 MED ORDER — LACTATED RINGERS IV SOLN
INTRAVENOUS | Status: DC
Start: 2023-05-11 — End: 2023-05-11

## 2023-05-11 MED ORDER — PHENOL 1.4 % MT LIQD: 1.0000 | OROMUCOSAL | Status: DC | PRN

## 2023-05-11 MED ORDER — PROPOFOL 10 MG/ML IV BOLUS
INTRAVENOUS | Status: DC | PRN
  Administered 2023-05-11: 20 mg via INTRAVENOUS
  Administered 2023-05-11: 200 mg via INTRAVENOUS

## 2023-05-11 MED ORDER — MIDAZOLAM HCL 2 MG/2ML IJ SOLN
INTRAMUSCULAR | Status: AC
Start: 2023-05-11 — End: ?
  Filled 2023-05-11: qty 2

## 2023-05-11 MED ORDER — BISOPROLOL FUMARATE 5 MG PO TABS
7.5000 mg | ORAL_TABLET | Freq: Every day | ORAL | Status: DC
  Administered 2023-05-12: 7.5 mg via ORAL
  Filled 2023-05-11: qty 1.5

## 2023-05-11 MED ORDER — TRANEXAMIC ACID-NACL 1000-0.7 MG/100ML-% IV SOLN
INTRAVENOUS | Status: AC
  Filled 2023-05-11: qty 100

## 2023-05-11 MED ORDER — BUPIVACAINE LIPOSOME 1.3 % IJ SUSP
INTRAMUSCULAR | Status: AC
  Filled 2023-05-11: qty 40

## 2023-05-11 MED ORDER — DEXAMETHASONE SODIUM PHOSPHATE 10 MG/ML IJ SOLN
8.0000 mg | Freq: Once | INTRAMUSCULAR | Status: AC
  Administered 2023-05-11: 8 mg via INTRAVENOUS

## 2023-05-11 MED ORDER — TRANEXAMIC ACID-NACL 1000-0.7 MG/100ML-% IV SOLN
INTRAVENOUS | Status: DC | PRN
  Administered 2023-05-11 (×2): 1000 mg via INTRAVENOUS

## 2023-05-11 MED ORDER — HYDROMORPHONE HCL 1 MG/ML IJ SOLN
INTRAMUSCULAR | Status: AC
  Filled 2023-05-11: qty 1

## 2023-05-11 MED ORDER — PANTOPRAZOLE SODIUM 40 MG PO TBEC
40.0000 mg | DELAYED_RELEASE_TABLET | Freq: Every day | ORAL | Status: DC
  Administered 2023-05-12: 40 mg via ORAL

## 2023-05-11 MED ORDER — SODIUM CHLORIDE FLUSH 0.9 % IV SOLN
INTRAVENOUS | Status: AC
  Filled 2023-05-11: qty 30

## 2023-05-11 MED ORDER — PHENYLEPHRINE HCL-NACL 20-0.9 MG/250ML-% IV SOLN
INTRAVENOUS | Status: DC | PRN
  Administered 2023-05-11: 15 ug/min via INTRAVENOUS
  Administered 2023-05-11: 25 ug/min via INTRAVENOUS
  Administered 2023-05-11 (×2): 160 ug via INTRAVENOUS
  Administered 2023-05-11: 80 ug via INTRAVENOUS
  Administered 2023-05-11 (×2): 160 ug via INTRAVENOUS

## 2023-05-11 MED ORDER — FUROSEMIDE 20 MG PO TABS
20.0000 mg | ORAL_TABLET | Freq: Every day | ORAL | Status: DC
Start: 2023-05-11 — End: 2023-05-12
  Administered 2023-05-12: 20 mg via ORAL

## 2023-05-11 MED ORDER — DIPHENHYDRAMINE HCL 50 MG/ML IJ SOLN
INTRAMUSCULAR | Status: DC | PRN
  Administered 2023-05-11: 12.5 mg via INTRAVENOUS

## 2023-05-11 MED ORDER — POTASSIUM CHLORIDE CRYS ER 20 MEQ PO TBCR
20.0000 meq | EXTENDED_RELEASE_TABLET | Freq: Every day | ORAL | Status: DC
  Administered 2023-05-12: 20 meq via ORAL

## 2023-05-11 MED ORDER — GLYCOPYRROLATE 0.2 MG/ML IJ SOLN
INTRAMUSCULAR | Status: DC | PRN
  Administered 2023-05-11: .2 mg via INTRAVENOUS

## 2023-05-11 MED ORDER — TRAMADOL HCL 50 MG PO TABS
ORAL_TABLET | ORAL | Status: AC
  Filled 2023-05-11: qty 1

## 2023-05-11 MED ORDER — SODIUM CHLORIDE 0.9 % IV SOLN: INTRAVENOUS | Status: DC

## 2023-05-11 MED ORDER — RANOLAZINE ER 500 MG PO TB12
500.0000 mg | ORAL_TABLET | Freq: Two times a day (BID) | ORAL | Status: DC
  Administered 2023-05-11 – 2023-05-12 (×2): 500 mg via ORAL
  Filled 2023-05-11 (×2): qty 1

## 2023-05-11 MED ORDER — ACETAMINOPHEN 10 MG/ML IV SOLN
INTRAVENOUS | Status: DC | PRN
  Administered 2023-05-11: 1000 mg via INTRAVENOUS

## 2023-05-11 MED ORDER — ONDANSETRON HCL 4 MG PO TABS: 4.0000 mg | ORAL_TABLET | Freq: Four times a day (QID) | ORAL | Status: DC | PRN

## 2023-05-11 MED ORDER — SURGIPHOR WOUND IRRIGATION SYSTEM - OPTIME: TOPICAL | Status: DC | PRN

## 2023-05-11 MED ORDER — KETOROLAC TROMETHAMINE 15 MG/ML IJ SOLN
INTRAMUSCULAR | Status: AC
  Filled 2023-05-11: qty 1

## 2023-05-11 MED ORDER — OXYCODONE HCL 5 MG/5ML PO SOLN: 5.0000 mg | Freq: Once | ORAL | Status: DC | PRN

## 2023-05-11 MED ORDER — ROPINIROLE HCL 1 MG PO TABS
2.0000 mg | ORAL_TABLET | Freq: Every day | ORAL | Status: DC
  Administered 2023-05-11: 2 mg via ORAL
  Filled 2023-05-11: qty 2

## 2023-05-11 MED ORDER — LOSARTAN POTASSIUM-HCTZ 50-12.5 MG PO TABS: 1.0000 | ORAL_TABLET | Freq: Every day | ORAL | Status: DC | PRN

## 2023-05-11 MED ORDER — BISOPROLOL FUMARATE 5 MG PO TABS: 10.0000 mg | ORAL_TABLET | Freq: Two times a day (BID) | ORAL | Status: DC

## 2023-05-11 MED ORDER — ROSUVASTATIN CALCIUM 20 MG PO TABS
40.0000 mg | ORAL_TABLET | Freq: Every day | ORAL | Status: DC
  Administered 2023-05-12: 40 mg via ORAL

## 2023-05-11 MED ORDER — CEFAZOLIN SODIUM-DEXTROSE 2-4 GM/100ML-% IV SOLN
2.0000 g | Freq: Four times a day (QID) | INTRAVENOUS | Status: AC
  Administered 2023-05-11 (×2): 2 g via INTRAVENOUS

## 2023-05-11 MED ORDER — ALPRAZOLAM 0.5 MG PO TABS
0.2500 mg | ORAL_TABLET | Freq: Every evening | ORAL | Status: DC | PRN
  Administered 2023-05-12: 0.25 mg via ORAL

## 2023-05-11 MED ORDER — OXYCODONE HCL 5 MG PO TABS: 5.0000 mg | ORAL_TABLET | Freq: Once | ORAL | Status: DC | PRN

## 2023-05-11 MED ORDER — SURGIFLO WITH THROMBIN (HEMOSTATIC MATRIX KIT) OPTIME
TOPICAL | Status: DC | PRN
  Administered 2023-05-11: 1 via TOPICAL

## 2023-05-11 MED ORDER — BUPIVACAINE-EPINEPHRINE (PF) 0.25% -1:200000 IJ SOLN
INTRAMUSCULAR | Status: AC
  Filled 2023-05-11: qty 60

## 2023-05-11 MED ORDER — SUGAMMADEX SODIUM 200 MG/2ML IV SOLN
INTRAVENOUS | Status: DC | PRN
  Administered 2023-05-11: 300 mg via INTRAVENOUS

## 2023-05-11 MED ORDER — CEFAZOLIN SODIUM-DEXTROSE 2-4 GM/100ML-% IV SOLN
INTRAVENOUS | Status: AC
  Filled 2023-05-11: qty 100

## 2023-05-11 MED ORDER — ONDANSETRON HCL 4 MG/2ML IJ SOLN
4.0000 mg | Freq: Four times a day (QID) | INTRAMUSCULAR | Status: DC | PRN
  Administered 2023-05-11 – 2023-05-12 (×2): 4 mg via INTRAVENOUS

## 2023-05-11 MED ORDER — ROCURONIUM BROMIDE 100 MG/10ML IV SOLN
INTRAVENOUS | Status: DC | PRN
  Administered 2023-05-11: 60 mg via INTRAVENOUS
  Administered 2023-05-11: 10 mg via INTRAVENOUS

## 2023-05-11 MED ORDER — CHLORHEXIDINE GLUCONATE 0.12 % MT SOLN
OROMUCOSAL | Status: AC
  Filled 2023-05-11: qty 15

## 2023-05-11 MED ORDER — DIPHENHYDRAMINE HCL 25 MG PO CAPS: 25.0000 mg | ORAL_CAPSULE | Freq: Three times a day (TID) | ORAL | Status: DC | PRN

## 2023-05-11 MED ORDER — KETOROLAC TROMETHAMINE 15 MG/ML IJ SOLN
INTRAMUSCULAR | Status: AC
Start: 2023-05-11 — End: ?
  Filled 2023-05-11: qty 1

## 2023-05-11 MED ORDER — PROPOFOL 1000 MG/100ML IV EMUL
INTRAVENOUS | Status: AC
  Filled 2023-05-11: qty 100

## 2023-05-11 MED ORDER — MENTHOL 3 MG MT LOZG: 1.0000 | LOZENGE | OROMUCOSAL | Status: DC | PRN

## 2023-05-11 MED ORDER — FENTANYL CITRATE (PF) 100 MCG/2ML IJ SOLN
INTRAMUSCULAR | Status: AC
  Filled 2023-05-11: qty 2

## 2023-05-11 MED ORDER — HYDROMORPHONE HCL 1 MG/ML IJ SOLN
INTRAMUSCULAR | Status: DC | PRN
  Administered 2023-05-11 (×2): .5 mg via INTRAVENOUS

## 2023-05-11 MED ORDER — PHENYLEPHRINE HCL-NACL 20-0.9 MG/250ML-% IV SOLN
INTRAVENOUS | Status: AC
  Filled 2023-05-11: qty 250

## 2023-05-11 MED ORDER — LIDOCAINE HCL (CARDIAC) PF 100 MG/5ML IV SOSY
PREFILLED_SYRINGE | INTRAVENOUS | Status: DC | PRN
  Administered 2023-05-11: 100 mg via INTRAVENOUS

## 2023-05-11 MED ORDER — BUSPIRONE HCL 5 MG PO TABS
5.0000 mg | ORAL_TABLET | Freq: Two times a day (BID) | ORAL | Status: DC
  Administered 2023-05-11 – 2023-05-12 (×2): 5 mg via ORAL
  Filled 2023-05-11 (×2): qty 1

## 2023-05-11 MED ORDER — BUSPIRONE HCL 5 MG PO TABS: 5.0000 mg | ORAL_TABLET | Freq: Two times a day (BID) | ORAL | Status: DC

## 2023-05-11 MED ORDER — ORAL CARE MOUTH RINSE: 15.0000 mL | Freq: Once | OROMUCOSAL | Status: AC

## 2023-05-11 MED ORDER — SODIUM CHLORIDE 0.9 % IR SOLN
Status: DC | PRN
  Administered 2023-05-11: 100 mL

## 2023-05-11 MED ORDER — CHLORHEXIDINE GLUCONATE 0.12 % MT SOLN
15.0000 mL | Freq: Once | OROMUCOSAL | Status: AC
Start: 2023-05-11 — End: 2023-05-11
  Administered 2023-05-11: 15 mL via OROMUCOSAL

## 2023-05-11 MED ORDER — DEXAMETHASONE SODIUM PHOSPHATE 10 MG/ML IJ SOLN
INTRAMUSCULAR | Status: AC
  Filled 2023-05-11: qty 1

## 2023-05-11 MED ORDER — ONDANSETRON HCL 4 MG/2ML IJ SOLN
INTRAMUSCULAR | Status: DC | PRN
  Administered 2023-05-11: 4 mg via INTRAVENOUS

## 2023-05-11 MED ORDER — ESCITALOPRAM OXALATE 10 MG PO TABS
10.0000 mg | ORAL_TABLET | Freq: Every day | ORAL | Status: DC
Start: 2023-05-11 — End: 2023-05-12
  Administered 2023-05-12: 10 mg via ORAL
  Filled 2023-05-11: qty 1

## 2023-05-11 MED ORDER — OXYCODONE HCL 5 MG PO TABS: 5.0000 mg | ORAL_TABLET | ORAL | Status: DC | PRN

## 2023-05-11 MED ORDER — HYDROMORPHONE HCL 1 MG/ML IJ SOLN
0.2500 mg | INTRAMUSCULAR | Status: DC | PRN
  Administered 2023-05-11: 0.25 mg via INTRAVENOUS
  Administered 2023-05-11: 0.5 mg via INTRAVENOUS
  Administered 2023-05-11: 0.25 mg via INTRAVENOUS
  Administered 2023-05-11 (×2): 0.5 mg via INTRAVENOUS

## 2023-05-11 MED ORDER — LIDOCAINE HCL (PF) 2 % IJ SOLN
INTRAMUSCULAR | Status: AC
  Filled 2023-05-11: qty 5

## 2023-05-11 MED ORDER — ACETAMINOPHEN 500 MG PO TABS
ORAL_TABLET | ORAL | Status: AC
Start: 2023-05-11 — End: ?
  Filled 2023-05-11: qty 2

## 2023-05-11 MED ORDER — SODIUM CHLORIDE (PF) 0.9 % IJ SOLN
INTRAMUSCULAR | Status: DC | PRN
  Administered 2023-05-11: 50 mL via INTRAMUSCULAR

## 2023-05-11 MED ORDER — HYDROCHLOROTHIAZIDE 12.5 MG PO TABS
12.5000 mg | ORAL_TABLET | Freq: Every day | ORAL | Status: DC
  Filled 2023-05-11: qty 1

## 2023-05-11 MED ORDER — KETOROLAC TROMETHAMINE 15 MG/ML IJ SOLN
7.5000 mg | Freq: Four times a day (QID) | INTRAMUSCULAR | Status: AC
  Administered 2023-05-11 – 2023-05-12 (×4): 7.5 mg via INTRAVENOUS

## 2023-05-11 MED ORDER — DIPHENHYDRAMINE HCL 50 MG/ML IJ SOLN
INTRAMUSCULAR | Status: AC
  Filled 2023-05-11: qty 1

## 2023-05-11 MED ORDER — FENTANYL CITRATE (PF) 100 MCG/2ML IJ SOLN
INTRAMUSCULAR | Status: DC | PRN
  Administered 2023-05-11 (×2): 50 ug via INTRAVENOUS

## 2023-05-11 MED ORDER — ACETAMINOPHEN 500 MG PO TABS
1000.0000 mg | ORAL_TABLET | Freq: Three times a day (TID) | ORAL | Status: DC
  Administered 2023-05-11 – 2023-05-12 (×3): 1000 mg via ORAL

## 2023-05-11 MED ORDER — LOSARTAN POTASSIUM 50 MG PO TABS: 50.0000 mg | ORAL_TABLET | Freq: Every day | ORAL | Status: DC

## 2023-05-11 SURGICAL SUPPLY — 72 items
ADH SKN CLS APL DERMABOND .7 (GAUZE/BANDAGES/DRESSINGS) ×1
AGENT HMST KT MTR STRL THRMB (HEMOSTASIS) ×1
APL PRP STRL LF DISP 70% ISPRP (MISCELLANEOUS) ×1
BLADE CLIPPER SURG (BLADE) IMPLANT
BLADE SAGITTAL AGGR TOOTH XLG (BLADE) ×1 IMPLANT
BNDG CMPR 5X6 CHSV STRCH STRL (GAUZE/BANDAGES/DRESSINGS) ×2
BNDG COHESIVE 6X5 TAN ST LF (GAUZE/BANDAGES/DRESSINGS) ×2 IMPLANT
CHLORAPREP W/TINT 26 (MISCELLANEOUS) ×1 IMPLANT
DERMABOND ADVANCED .7 DNX12 (GAUZE/BANDAGES/DRESSINGS) ×1 IMPLANT
DRAPE C-ARM XRAY 36X54 (DRAPES) ×1 IMPLANT
DRAPE POUCH INSTRU U-SHP 10X18 (DRAPES) ×1 IMPLANT
DRAPE SHEET LG 3/4 BI-LAMINATE (DRAPES) ×3 IMPLANT
DRAPE TABLE BACK 80X90 (DRAPES) ×1 IMPLANT
DRSG MEPILEX SACRM 8.7X9.8 (GAUZE/BANDAGES/DRESSINGS) ×1 IMPLANT
DRSG OPSITE POSTOP 4X8 (GAUZE/BANDAGES/DRESSINGS) ×1 IMPLANT
ELECT BLADE 4.0 EZ CLEAN MEGAD (MISCELLANEOUS) ×1
ELECT REM PT RETURN 9FT ADLT (ELECTROSURGICAL) ×1
ELECTRODE BLDE 4.0 EZ CLN MEGD (MISCELLANEOUS) ×1 IMPLANT
ELECTRODE REM PT RTRN 9FT ADLT (ELECTROSURGICAL) ×1 IMPLANT
GLOVE BIO SURGEON STRL SZ8 (GLOVE) ×1 IMPLANT
GLOVE BIOGEL PI IND STRL 8 (GLOVE) ×1 IMPLANT
GLOVE PI ORTHO PRO STRL 7.5 (GLOVE) ×2 IMPLANT
GLOVE PI ORTHO PRO STRL SZ8 (GLOVE) ×2 IMPLANT
GLOVE SURG SYN 7.5 E (GLOVE) ×1 IMPLANT
GLOVE SURG SYN 7.5 PF PI (GLOVE) ×1 IMPLANT
GOWN SRG XL LVL 3 NONREINFORCE (GOWNS) ×1 IMPLANT
GOWN STRL NON-REIN TWL XL LVL3 (GOWNS) ×1
GOWN STRL REUS W/ TWL LRG LVL3 (GOWN DISPOSABLE) ×1 IMPLANT
GOWN STRL REUS W/ TWL XL LVL3 (GOWN DISPOSABLE) ×1 IMPLANT
GOWN STRL REUS W/TWL LRG LVL3 (GOWN DISPOSABLE) ×1
GOWN STRL REUS W/TWL XL LVL3 (GOWN DISPOSABLE) ×1
HANDLE YANKAUER SUCT OPEN TIP (MISCELLANEOUS) ×1 IMPLANT
HEAD CERAMIC FEMORAL 36MM (Head) IMPLANT
HOOD PEEL AWAY T7 (MISCELLANEOUS) ×2 IMPLANT
INSERT 0 DEGREE 36 (Miscellaneous) IMPLANT
IV NS 100ML SINGLE PACK (IV SOLUTION) ×1 IMPLANT
KIT PATIENT CARE HANA TABLE (KITS) ×1 IMPLANT
LIGHT WAVEGUIDE WIDE FLAT (MISCELLANEOUS) ×1 IMPLANT
MANIFOLD NEPTUNE II (INSTRUMENTS) ×1 IMPLANT
MARKER SKIN DUAL TIP RULER LAB (MISCELLANEOUS) ×1 IMPLANT
MAT ABSORB FLUID 56X50 GRAY (MISCELLANEOUS) ×1 IMPLANT
NDL FILTER BLUNT 18X1 1/2 (NEEDLE) IMPLANT
NDL SAFETY ECLIPSE 18X1.5 (NEEDLE) ×1 IMPLANT
NDL SPNL 20GX3.5 QUINCKE YW (NEEDLE) ×1 IMPLANT
NEEDLE FILTER BLUNT 18X1 1/2 (NEEDLE) IMPLANT
NEEDLE SPNL 20GX3.5 QUINCKE YW (NEEDLE) ×1 IMPLANT
NS IRRIG 500ML POUR BTL (IV SOLUTION) ×1 IMPLANT
PACK HIP COMPR (MISCELLANEOUS) ×1 IMPLANT
PAD ARMBOARD 7.5X6 YLW CONV (MISCELLANEOUS) ×1 IMPLANT
SCREW HEX LP 6.5X15 (Screw) IMPLANT
SCREW HEX LP 6.5X20 (Screw) IMPLANT
SHELL TRIDENT II CLUST 50 (Shell) IMPLANT
SLEEVE SCD COMPRESS KNEE MED (STOCKING) ×1 IMPLANT
SOLUTION IRRIG SURGIPHOR (IV SOLUTION) ×1 IMPLANT
STEM HIGH OFFSET SZ4 36X103 (Stem) IMPLANT
SURGIFLO W/THROMBIN 8M KIT (HEMOSTASIS) IMPLANT
SUT BONE WAX W31G (SUTURE) ×1 IMPLANT
SUT DVC 2 QUILL PDO T11 36X36 (SUTURE) ×1 IMPLANT
SUT ETHIBOND 2 V 37 (SUTURE) ×1 IMPLANT
SUT QUILL MONODERM 3-0 PS-2 (SUTURE) ×1 IMPLANT
SUT SILK 0 (SUTURE) ×1
SUT SILK 0 30XBRD TIE 6 (SUTURE) ×1 IMPLANT
SUT VIC AB 0 CT1 36 (SUTURE) ×1 IMPLANT
SUT VIC AB 2-0 CT2 27 (SUTURE) ×2 IMPLANT
SYR 30ML LL (SYRINGE) ×2 IMPLANT
SYR TB 1ML LL NO SAFETY (SYRINGE) IMPLANT
TAPE MICROFOAM 4IN (TAPE) IMPLANT
TOWEL OR 17X26 4PK STRL BLUE (TOWEL DISPOSABLE) IMPLANT
TRAP FLUID SMOKE EVACUATOR (MISCELLANEOUS) ×1 IMPLANT
TRAY FOLEY SLVR 16FR LF STAT (SET/KITS/TRAYS/PACK) IMPLANT
WAND WEREWOLF FASTSEAL 6.0 (MISCELLANEOUS) ×1 IMPLANT
WATER STERILE IRR 1000ML POUR (IV SOLUTION) ×1 IMPLANT

## 2023-05-11 NOTE — Anesthesia Postprocedure Evaluation (Addendum)
Anesthesia Post Note  Patient: Amber Barnes  Procedure(s) Performed: TOTAL HIP ARTHROPLASTY ANTERIOR APPROACH (Left: Hip)  Patient location during evaluation: PACU Anesthesia Type: General Level of consciousness: awake and alert Pain management: pain level controlled Vital Signs Assessment: post-procedure vital signs reviewed and stable Respiratory status: spontaneous breathing, nonlabored ventilation, respiratory function stable and patient connected to nasal cannula oxygen Cardiovascular status: blood pressure returned to baseline and stable Postop Assessment: no apparent nausea or vomiting Anesthetic complications: yes   Encounter Notable Events  Notable Event Outcome Phase Comment  Difficult to intubate - expected  Intraprocedure Filed from anesthesia note documentation.     Last Vitals:  Vitals:   05/11/23 1200 05/11/23 1245  BP: (!) 104/58 (!) 109/94  Pulse:  (!) 54  Resp: 12 13  Temp:  (!) 36.1 C  SpO2: 93% 92%    Last Pain:  Vitals:   05/11/23 1200  TempSrc:   PainSc: Asleep                 Louie Boston

## 2023-05-11 NOTE — Assessment & Plan Note (Addendum)
Suboptimal control .Ferritin 77.  Increase Requip to 3 mg at bedtime.

## 2023-05-11 NOTE — Interval H&P Note (Signed)
Patient history and physical updated. Consent reviewed including risks, benefits, and alternatives to surgery. Patient agrees with above plan to proceed with left anterior total hip arthroplasty  

## 2023-05-11 NOTE — Anesthesia Preprocedure Evaluation (Addendum)
Anesthesia Evaluation  Patient identified by MRN, date of birth, ID band Patient awake    Reviewed: Allergy & Precautions, NPO status , Patient's Chart, lab work & pertinent test results  History of Anesthesia Complications (+) Emergence Delirium and history of anesthetic complications  Airway Mallampati: III  TM Distance: >3 FB Neck ROM: full    Dental  (+) Chipped   Pulmonary sleep apnea , COPD, former smoker   Pulmonary exam normal        Cardiovascular hypertension, On Medications and On Home Beta Blockers + CAD and +CHF  + dysrhythmias (s/p ablation) Atrial Fibrillation   Follow-up limited TTE status post cardiac ablation performed on 12/13/2022.  EF had improved to 50-55%.  There were no regional wall motion abnormalities.  Mild LVH noted.  There was moderate mitral annular calcification.   Per cardiology, "Ms. Normington's perioperative risk of a major cardiac event is 0.4% according to the Revised Cardiac Risk Index (RCRI).  Therefore, she is at LOW risk for perioperative complications.  According to ACC/AHA guidelines, no further cardiovascular testing needed.  The patient may proceed to surgery at ACCEPTABLE risk."   Neuro/Psych  Headaches PSYCHIATRIC DISORDERS Anxiety Depression     Neuromuscular disease    GI/Hepatic Neg liver ROS, PUD,GERD  Controlled,,  Endo/Other  Hypothyroidism  Morbid obesity  Renal/GU Renal disease     Musculoskeletal   Abdominal   Peds  Hematology  (+) Blood dyscrasia, anemia   Anesthesia Other Findings Past Medical History: No date: Achilles tendinitis No date: Acute medial meniscal tear No date: Allergy No date: Anemia No date: Anxiety     Comment:  a.) on BZO PRN (alprazolam) No date: Aortic atherosclerosis (HCC) 10/20/2022: Ascending aorta dilatation (HCC)     Comment:  a.) TTE 10/20/2022: asc Ao 39 mm 07/2016: Atrial fibrillation/flutter (HCC)     Comment:  a.) Dx'd 07/2016;  b.) CHA2DS2-VASc = 5 (age, sex,               HFmrEF, HTN, vasc disease history) as of 05/09/2023; c.)               s/p DCCV 09/07/2016 (150 J x 2 --> A.flutter; 200 J x 1               --> NSR); d.) s/p DCCV 10/19/2016 (150 J x 1 --> NSR);               e.) brought in for DCCV 12/2016; cancelled d/t NSR; f.)               s/p PVI ablation 12/12/2022; g.) cardiac rate/rhythm               maintained on oral bisoprolol; chronically anticoagulated              using apixaban No date: CAD (coronary artery disease)     Comment:  a.) cCTA 12/08/2022: Ca2+ = 31.6 (58th %'ile for               age/sex/race match control) 05/18/2021: Chemotherapy induced nausea and vomiting No date: Chronic heart failure with mid-range ejection fraction  (HFmrEF) (HCC)     Comment:  a.) TTE 03/08/21: EF 60-65, mild LVH, G1DD, mild LAE,               triv MR; b.) TTE 07/29/21: EF 60-65, G1DD, mild LAE, triv               MR; c.) TTE  11/26/21: EF 55-60, G1DD, mild LAE, mild MR;               d.) TTE 02/25/22: EF 60-65, G1DD, mild LAE, mild MR; e.)               TTE 05/31/22: EF 55-60, mild LVH, G2DD, mild-mod MR; f.)               TTE 10/20/22: EF 45-50, mild LAE, mod red RVSF, mild-mod               TR, Asc Ao 39; g.) TTE 12/13/22: EF 50-55, mild LVH, mild               MAC No date: CKD (chronic kidney disease), stage II No date: Colon polyp No date: Complication of anesthesia     Comment:  a.) emergence delirium; b.) hypoxia after hip               replacement No date: COPD (chronic obstructive pulmonary disease) (HCC) No date: DDD (degenerative disc disease), lumbar     Comment:  a.) s/p fusion No date: Depression No date: Endometriosis No date: Fibrocystic breast disease No date: GERD (gastroesophageal reflux disease) No date: History of stress test     Comment:  a.) MV 07/29/2016: EF 55-65%. Small, mild defect  in mid              anteroseptal, apical septal, and apical locations. No                ischemia-->low risk; b.) MV 10/2022: mod sized partially               rev apical ant, apical septal, and apical defect. EF 52%.              No signific cor Ca2+; c.) cCTA 12/08/2022: Ca2+ = 31.6               (58th %'ile). No date: History of tobacco use No date: Hyperlipidemia No date: Hypertension No date: Hypothyroidism     Comment:  a.) s/p partial thyroidectomy for nodule; pathology               benign No date: Insomnia No date: Lipoma 03/03/2021: Malignant neoplasm of upper-outer quadrant of right  female breast (HCC)     Comment:  a.) stage 1A invasive mammary carcinoma (cT1b or T1c               N0); ER (-), PR (+), HER2/neu (+); did not tolerate               adjuvant chemotherapy (paclitacel + trastuzumab) despite               paclitaxel dose reduction; s/p adjuvant XRT; cinished               maintenance trastuzumab 07/27/2022; no adjuvant endocrine               therapy as no clear benefit No date: Migraine No date: Nephrolithiasis No date: On apixaban therapy No date: OSA (obstructive sleep apnea)     Comment:  a.) non-compliant with nocturnal PAP therapy No date: Osteoarthritis No date: Osteopenia No date: Peptic ulcer disease     Comment:  a.) H. pylori 12/12/2022: Pericarditis     Comment:  a.) following A.fib ablation; resolved No date: Pneumonia No date: Port-A-Cath in place No date: RLS (restless legs syndrome)     Comment:  a.)  on ropinirole No date: Vitamin D deficiency  Past Surgical History: No date: ABDOMINAL HYSTERECTOMY     Comment:  ovaries left No date: ACHILLES TENDON SURGERY     Comment:  2012,01/2017 12/12/2022: ATRIAL FIBRILLATION ABLATION; N/A     Comment:  Procedure: ATRIAL FIBRILLATION ABLATION;  Surgeon:               Lanier Prude, MD;  Location: [facility];                Service: Cardiovascular;  Laterality: N/A; 01/15/2021: BREAST BIOPSY; Right     Comment:  Affirm biopsy-"X" clip-INVASIVE MAMMARY  CARCINOMA 01/15/2021: BREAST BIOPSY; Right     Comment:  u/s bx-"venus" clip INVASIVE MAMMARY CARCINOMA No date: BREAST BIOPSY 01/04/2023: BREAST BIOPSY; Right     Comment:  u/s bx ribbon clip path pending 01/04/2023: BREAST BIOPSY; Right     Comment:  [place] RT BREAST BX W LOC DEV 1ST LESION IMG BX SPEC [place]               GUIDE 01/04/2023 [facility] 02/17/2021: BREAST LUMPECTOMY,RADIO FREQ LOCALIZER,AXILLARY SENTINEL  LYMPH NODE BIOPSY; Right     Comment:  Procedure: BREAST LUMPECTOMY,RADIO FREQ               LOCALIZER,AXILLARY SENTINEL LYMPH NODE BIOPSY;  Surgeon:               Campbell Lerner, MD;  Location: [facility];  Service:               General;  Laterality: Right; 09/07/2016: CARDIOVERSION; N/A     Comment:  Procedure: CARDIOVERSION;  Surgeon: Antonieta Iba,               MD;  Location: [facility];  Service: Cardiovascular;                Laterality: N/A; 10/19/2016: CARDIOVERSION; N/A     Comment:  Procedure: CARDIOVERSION;  Surgeon: Antonieta Iba,               MD;  Location: [facility];  Service: Cardiovascular;                Laterality: N/A; 12/14/2016: CARDIOVERSION; N/A     Comment:  Procedure: CARDIOVERSION;  Surgeon: Antonieta Iba,               MD;  Location: [facility];  Service: Cardiovascular;                Laterality: N/A; No date: COLONOSCOPY 05/13/2020: COLONOSCOPY WITH PROPOFOL; N/A     Comment:  Procedure: COLONOSCOPY WITH PROPOFOL;  Surgeon: Tol[place],               Boykin Nearing, MD;  Location: [facility];  Service:               Gastroenterology;  Laterality: N/A;  ELIQUIS 07/12/2021: CYSTOSCOPY WITH STENT PLACEMENT; Bilateral     Comment:  Procedure: CYSTOSCOPY WITH STENT PLACEMENT;  Surgeon:               Sondra Come, MD;  Location: [facility];  Service:               Urology;  Laterality: Bilateral; 07/30/2021: CYSTOSCOPY/URETEROSCOPY/HOLMIUM LASER/STENT PLACEMENT;  Bilateral     Comment:  Procedure: CYSTOSCOPY/URETEROSCOPY/HOLMIUM                LASER/BILATERAL STENT REMOVAL;  Surgeon: Legrand Rams  C, MD;  Location: [facility];  Service: Urology;                Laterality: Bilateral; No date: ESOPHAGOGASTRODUODENOSCOPY 05/13/2020: ESOPHAGOGASTRODUODENOSCOPY (EGD) WITH PROPOFOL; N/A     Comment:  Procedure: ESOPHAGOGASTRODUODENOSCOPY (EGD) WITH               PROPOFOL;  Surgeon: Toledo, Boykin Nearing, MD;  Location:               [facility];  Service: Gastroenterology;  Laterality:               N/A; No date: FOOT SURGERY; Left     Comment:  tendon No date: LUMBAR FUSION 03/23/2021: PORTA CATH INSERTION; N/A     Comment:  Procedure: PORTA CATH INSERTION;  Surgeon: Renford Dills, MD;  Location: [facility];  Service:              Cardiovascular;  Laterality: N/A; 03/23/2021: PORTACATH PLACEMENT; N/A     Comment:  Procedure: ATTEMPTED INSERTION PORT-A-CATH;  Surgeon:               Campbell Lerner, MD;  Location: [facility];  Service:               General;  Laterality: N/A; 1990: THYROIDECTOMY     Comment:  half thyroid lobectomy secondary to a benign nodule 08/21/2018: TOTAL HIP ARTHROPLASTY; Right     Comment:  Procedure: TOTAL HIP ARTHROPLASTY ANTERIOR               APPROACH-RIGHT CELL SAVER REQUESTED;  Surgeon: Kennedy Bucker, MD;  Location: [facility];  Service: Orthopedics;               Laterality: Right;  BMI    Body Mass Index: 40.02 kg/m      Reproductive/Obstetrics negative OB ROS                             Anesthesia Physical Anesthesia Plan  ASA: 3  Anesthesia Plan: General ETT   Post-op Pain Management: Ofirmev IV (intra-op)*, Toradol IV (intra-op)* and Dilaudid IV   Induction: Intravenous  PONV Risk Score and Plan: 2 and Dexamethasone, Ondansetron and Treatment may vary due to age or medical condition  Airway Management Planned: Oral ETT  Additional Equipment:   Intra-op Plan:   Post-operative Plan: Extubation  in OR  Informed Consent: I have reviewed the patients History and Physical, chart, labs and discussed the procedure including the risks, benefits and alternatives for the proposed anesthesia with the patient or authorized representative who has indicated his/her understanding and acceptance.     Dental Advisory Given  Plan Discussed with: Anesthesiologist, CRNA and Surgeon  Anesthesia Plan Comments: (Patient consented for risks of anesthesia including but not limited to:  - adverse reactions to medications - damage to eyes, teeth, lips or other oral mucosa - nerve damage due to positioning  - sore throat or hoarseness - Damage to heart, brain, nerves, lungs, other parts of body or loss of life  Patient voiced understanding and assent. Patient took Eliquis 10/28, not a candidate for spinal)        Anesthesia Quick Evaluation

## 2023-05-11 NOTE — Op Note (Signed)
Patient Name: Ayeisha Eanes  ATF:[id number]  Pre-Operative Diagnosis: Left hip Osteoarthritis  Post-Operative Diagnosis: (same)  Procedure: Left Total Hip Arthroplasty  Components/Implants: Cup: Trident Tritanium Clusterhole 17mm/D w/ x2 screws    Liner: Neutral X3 poly 36/D  Stem: Insignia #4 high offset  Head:biolox ceramic 36mm +72mm  Date of Surgery: 05/11/2023  Surgeon: Reinaldo Berber MD  Assistant: Amador Cunas PA (present and scrubbed throughout the case, critical for assistance with exposure, retraction, instrumentation, and closure), Minor PAS   Anesthesiologist: Joelene Millin  Anesthesia: General   EBL: 250cc  IVF:600cc  Complications: None   Brief history: The patient is a 72 year old female with a history of osteoarthritis of the left hip with pain limiting their range of motion and activities of daily living, which has failed multiple attempts at conservative therapy.  The risks and benefits of total hip arthroplasty as definitive surgical treatment were discussed with the patient, who opted to proceed with the operation.  After outpatient medical clearance and optimization was completed the patient was admitted to [facility] for the procedure.  All preoperative films were reviewed and an appropriate surgical plan was made prior to surgery.   Description of procedure: The patient was brought to the operating room where laterality was confirmed by all those present to be the left side.  The patient was administered general anesthesia on a stretcher prior to being moved supine on the operating room table. Patient was given an intravenous dose of antibiotics for surgical prophylaxis and TXA.  All bony prominences and extremities were well padded and the patient was securely attached to the table boots, a perineal post was placed and the patient had a safety strap placed.  Surgical site was prepped with alcohol and chlorhexidine. The surgical site over the  hip was and draped in typical sterile fashion with multiple layers of adhesive and nonadhesive drapes.  The incision site was marked out with a sterile marker and care was taken to assess the position of the ASIS and ensure appropriate position for the incision.    A surgical timeout was then called with participation of all staff in the room the patient was then a confirmed again and laterality confirmed.  Incision was made over the anterior lateral aspect of the proximal thigh in line with the TFL.  Appropriate retractors were placed and all bleeding vessels were coagulated within the subcutaneous and fatty layers.  An incision was made in the TFL fascia in the interval was carefully identified.  The lateral ascending branches of the circumflex vessels were identified, cauterized and carefully dissected. The main vessels were then tied with a 0 silk hand tie.  Retractors were placed around the superior lateral and inferior medial aspects of the femoral neck and a capsulotomy was performed exposing the hip joint.  Retraction stitches were placed and the capsulotomy to assist with visualization.  Femoral neck cut was then made and the femoral head was extracted after placing the leg in traction.  Bone wax was then applied to the proximal cut surface of the femur and aqua mantis was used to address any bleeding around the femoral neck cut.  Retractors were then placed around the acetabulum to fully visualize the joint space, and the remaining labral tissue was removed and pulvinar was removed.   The acetabulum was then sequentially reamed up to the appropriate size in order to get good fit and fill for the acetabular component while under fluoroscopic guidance.  Acetabular component was then placed and  malleted into a secure fit while confirming position and abduction angle and anteversion utilizing fluoroscopy.  2 screws were then placed in the acetabular cup to assist in securing the cup in place. The cup was  irrigated,  a real neutral liner was placed, impacted, and checked for stability. The femur traction was dropped and sequentially externally rotated while performing a release of the posterior and superomedial tissues off of the proximal femur to allow for mobility, care was taken to preserve the external rotators and piriformis attachments.  The remaining interval between the abductors and the capsule was dissected out and a retractor was placed over the superolateral aspect of the femur over the greater trochanter.  The leg was carefully brought down into extension and adducted to provide visualization of the proximal femur for broaching.  The femur was then sequentially broached up to an appropriate size which provided for good fill and stability to the femoral broach.  A trial neck and head were placed on the femoral broach and the leg was brought up for reduction.  The hip was reduced and manual check of stability was performed.  The hip was found to be stable in flexion internal rotation and extension external rotation.  Leg lengths were confirmed on fluoroscopy.   The hip was then dislocated the trial neck and head were removed.  The leg was then brought down into extension and adduction in the proximal femur was reexposed.  The broach trial was removed and the femur was irrigated with normal saline prior to the real femoral stem being implanted.  After the femoral stem was seated and shown to have good fit and fill the appropriate head was impacted the leg was brought up and reduced.  There was good range of motion with stability in flexion internal rotation and extension external rotation on testing.  Leg lengths were found to be appropriate on fluoroscopic evaluation at this time.  The hip was then irrigated with betdine based surgiphor solution and then saline solution.  The capsulotomy was repaired with Ethibond sutures.  Surgiflo was then placed on the exposed cut surface of the femur and  periacetabular area to reduce any postoperative bleeding. A pericapsular and peritrochanteric cocktail with Exparel and bupivacaine was then injected as well as the subcutaneous tissues. The fascia was closed with a #1 barbed running suture.  The deep tissues were closed with Vicryl sutures the subcutaneous tissues were closed with interrupted Vicryl sutures and a running barbed 4-0 suture.  The skin was then reinforced with Dermabond and a sterile dressing was placed.   The patient was awoken from anesthesia transferred off of the operating room table onto a hospital bed where examination of leg lengths found the leg lengths to be equal with a good distal pulse.  The patient was then transferred to the PACU in stable condition.

## 2023-05-11 NOTE — Plan of Care (Signed)
 [facility] Tonsillectomy/Adenoidectomy, Postoperative PEDS care plan entered in error.

## 2023-05-11 NOTE — Plan of Care (Signed)
  Problem: Pain Management: Goal: Pain level will decrease with appropriate interventions Outcome: Progressing   

## 2023-05-11 NOTE — Transfer of Care (Signed)
Immediate Anesthesia Transfer of Care Note  Patient: Amber Barnes  Procedure(s) Performed: TOTAL HIP ARTHROPLASTY ANTERIOR APPROACH (Left: Hip)  Patient Location: PACU  Anesthesia Type:General  Level of Consciousness: awake and drowsy  Airway & Oxygen Therapy: Patient Spontanous Breathing  Post-op Assessment: Report given to RN and Post -op Vital signs reviewed and stable  Post vital signs: Reviewed and stable  Last Vitals:  Vitals Value Taken Time  BP 161/70 05/11/23 1030  Temp 36.3 C 05/11/23 1021  Pulse 57 05/11/23 1033  Resp 12 05/11/23 1033  SpO2 100 % 05/11/23 1033  Vitals shown include unfiled device data.  Last Pain:  Vitals:   05/11/23 1021  TempSrc:   PainSc: Asleep         Complications:  Encounter Notable Events  Notable Event Outcome Phase Comment  Difficult to intubate - expected  Intraprocedure Filed from anesthesia note documentation.

## 2023-05-11 NOTE — Anesthesia Procedure Notes (Signed)
Procedure Name: Intubation Date/Time: 05/11/2023 7:43 AM  Performed by: Ginger Carne, CRNAPre-anesthesia Checklist: Patient identified, Emergency Drugs available, Suction available, Patient being monitored and Timeout performed Patient Re-evaluated:Patient Re-evaluated prior to induction Oxygen Delivery Method: Circle system utilized Preoxygenation: Pre-oxygenation with 100% oxygen Induction Type: IV induction Ventilation: Mask ventilation with difficulty, Oral airway inserted - appropriate to patient size and Two handed mask ventilation required Laryngoscope Size: McGraph and 3 Grade View: Grade I Tube type: Oral Tube size: 7.0 mm Number of attempts: 1 Airway Equipment and Method: Stylet and Video-laryngoscopy Placement Confirmation: ETT inserted through vocal cords under direct vision, positive ETCO2 and breath sounds checked- equal and bilateral Secured at: 20 cm Tube secured with: Tape Dental Injury: Teeth and Oropharynx as per pre-operative assessment  Difficulty Due To: Difficulty was anticipated and Difficult Airway- due to limited oral opening

## 2023-05-11 NOTE — Assessment & Plan Note (Signed)
Labile blood pressure. Advised to take losartan hydrochlorothiazide as needed for blood pressure greater than 140/80.  Referral to pulmonology for retesting of sleep apnea in the setting of labile blood pressure and history of atrial fibrillation.

## 2023-05-11 NOTE — H&P (Signed)
History of Present Illness: Amber Barnes is an 72 y.o. female who presents for history and physical for left anterior total hip arthroplasty with Dr. Audelia Acton on 05/11/2023. Patient has had years of progressive left hip pain. Pain is moderate to severe in her groin lateral hip and buttocks and increases with any attempted weightbearing. She has a hard time walking short distances. States she had to use a wheelchair to come in today from her car. Pain is 10 out of 10 when she stands. She has tried Tylenol he and ice with no improvement. The pain is the same pain she experienced in her right hip before having right hip replacement. She denies any recent falls trauma or injury. No numbness or tingling or weakness. Pain is severe and interferes with her quality of life and activities day living. Unable to take NSAIDs due to being on Eliquis  Of note the patient reports she is in remission from breast cancer treatment and was recently told by her cardiologist that she is stable and would be a candidate for surgery.  Patient is a BMI of 39.6 is a non-smoker and a nondiabetic with an A1c of 4.6  Past Medical History: Past Medical History:  Diagnosis Date  Atrial fibrillation (CMS/HHS-HCC)  Colon polyp  Cystic breast  Depression  Endometriosis  Hyperlipidemia  Hypertension   Past Surgical History: Past Surgical History:  Procedure Laterality Date  ARTHROPLASTY HIP TOTAL Right 08/21/2018  COLONOSCOPY 05/13/2020  Tubular adenomas/Repeat 67yrs/TKT  EGD 05/13/2020  Normal EGD biopsies/Esophageal stenosis. Dilated/No repeat needed/TKT  back surgery  COLONOSCOPY  foot surgery  HYSTERECTOMY VAGINAL  thyroid sugery  UPPER GASTROINTESTINAL ENDOSCOPY   Past Family History: Family History  Problem Relation Age of Onset  Leukemia Mother  Coronary Artery Disease (Blocked arteries around heart) Father  Diabetes Father  Colon cancer Father   Medications: Current Outpatient Medications   Medication Sig Dispense Refill  ALPRAZolam (XANAX) 0.25 MG tablet Take by mouth Take 1 tablet (0.25 mg total) by mouth daily as needed for anxiety.  AMIOdarone (PACERONE) 200 MG tablet Take 200 mg by mouth once daily  apixaban (ELIQUIS) 5 mg tablet TAKE 1 TABLET BY MOUTH TWICE A DAY  b complex vitamins capsule Take 1 capsule by mouth once daily  bisoprolol (ZEBETA) 5 MG tablet Take by mouth Take 0.5 tablets (2.5 mg total) by mouth daily.  calcium carbonate (CALCIUM 500 ORAL) 1 tablet daily  escitalopram oxalate (LEXAPRO) 10 MG tablet Take 1 tablet by mouth once daily  esomeprazole (NEXIUM) 20 MG DR capsule TAKE 1 CAPSULE BY MOUTH TWICE A DAY 180 capsule 3  FUROsemide (LASIX) 20 MG tablet Take 20 mg by mouth once daily  losartan (COZAAR) 25 MG tablet TAKE 1 TABLET BY MOUTH EVERY DAY  metoclopramide (REGLAN) 10 MG tablet Take 1 tablet by mouth 4 hours before starting colon prep and then you may take an additional tablet by mouth 6 hours after until finished 5 tablet 0  multivitamin (MULTIVITAMIN) tablet Take 1 tablet by mouth once daily  oxymetazoline (NO DRIP) 0.05 % nasal spray Place 1 spray into both nostrils once daily as needed  potassium chloride (KLOR-CON 10) 10 MEQ ER tablet Take 10 mEq by mouth once daily  ranolazine (RANEXA) 500 MG ER tablet TAKE 1 TABLET BY MOUTH TWICE A DAY  rizatriptan (MAXALT) 10 MG tablet Take 10 mg by mouth once as needed  rosuvastatin (CRESTOR) 20 MG tablet rosuvastatin 20 mg tablet TK 1 T PO D  sertraline (ZOLOFT) 100  MG tablet TAKE 1 TABLET BY MOUTH EVERY DAY  VITAMIN D3 ORAL 1 tablet daily   No current facility-administered medications for this visit.   Allergies: Allergies  Allergen Reactions  Oxycodone Itching  Can take it with benadryl  Tramadol Itching  Can take it with benadryl  Adhesive Tape-Silicones Itching and Rash  Prefers paper tape  Latex Itching and Rash  use paper tape use paper tap  Tapentadol Itching and Rash    Visit  Vitals: Vitals:  04/26/23 1040  BP: 118/80    Review of Systems:  A comprehensive 14 point ROS was performed, reviewed, and the pertinent orthopaedic findings are documented in the HPI.  Physical Exam: Body mass index is 39.61 kg/m. General:  Well developed, well nourished, no apparent distress, normal affect, presents in a wheelchair  HEENT: Head normocephalic, atraumatic, PERRL.   Abdomen: Soft, non tender, non distended, Bowel sounds present.  Heart: Examination of the heart reveals regular, rate, and rhythm. There is no murmur noted on ascultation. There is a normal apical pulse.  Lungs: Lungs are clear to auscultation. There is no wheeze, rhonchi, or crackles. There is normal expansion of bilateral chest walls.   Left hip exam  SKIN: intact SWELLING: none WARMTH: no warmth TENDERNESS: none, Stinchfield Positive ROM: 10 degrees internal rotation and 30 degrees external rotation and pain with internal rotation, which localizes to the groin reproduces her pain; Hip Flexion 90 STRENGTH: normal GAIT: antalgic STABILITY: stable to testing CREPITUS: no LEG LENGTH DISCREPANCY: none NEUROLOGICAL EXAM: normal VASCULAR EXAM: normal LUMBAR SPINE: tenderness: no straight leg raising sign: no motor exam: normal  The contralateral hip was examined for comparison and it showed: TENDERNESS: none ROM: normal and full STRENGTH: normal STABILITY: stable to testing  Hip Imaging :  I have reviewed AP pelvis and lateral hip X-rays (2 views) taken today in the office 03/28/2023. Left hip which reveal moderate/severe degenerative changes with joint space narrowing and osteophyte formation, subchondral cysts and sclerosis with femoral head deformity and a large cam lesion on the left hip. The right hip is status post total hip arthroplasty with a fairly large cup which is vertical in orientation and a well fit stem which is in slight varus neither appear to be changed from prior films  no evidence of periprosthetic fracture or loosening.   Assessment:  Encounter Diagnosis  Name Primary?  Primary osteoarthritis of left hip Yes   Plan: Amber Barnes is a 72 year old female with severe left hip degenerative arthritis. She has had years of increasing hip pain that interferes with her quality of life and activities day living. She has agreed and consented to a left anterior total hip arthroplasty with Dr. Audelia Acton on 05/11/2023. Risks, benefits, complications of surgery have been discussed with the patient. Patient has agreed and consented the procedure.   The hospitalization and post-operative care and rehabilitation were also discussed. The use of perioperative antibiotics and DVT prophylaxis were discussed. The risk, benefits and alternatives to a surgical intervention were discussed at length with the patient. The patient was also advised of risks related to the medical comorbidities and elevated body mass index (BMI). A lengthy discussion took place to review the most common complications including but not limited to: deep vein thrombosis, pulmonary embolus, heart attack, stroke, infection, wound breakdown, heterotopic ossification, dislocation, numbness, leg length in-equality, intraoperative fracture, damage to nerves, tendon,muscles, arteries or other blood vessels, death and other possible complications from anesthesia. The patient was told that we will take steps to  minimize these risks by using sterile technique, antibiotics and DVT prophylaxis when appropriate and follow the patient postoperatively in the office setting to monitor progress. The possibility of recurrent pain, no improvement in pain and actual worsening of pain were also discussed with the patient. The risk of dislocation following total hip replacement was discussed and potential precautions to prevent dislocation were reviewed.   Medical and cardiac clearance have been obtained. Eliquis was held for 3 days prior to  surgery.

## 2023-05-11 NOTE — Evaluation (Signed)
Physical Therapy Evaluation Patient Details Name: Amber Barnes MRN: [id number] DOB: 02-24-1951 Today's Date: 05/11/2023  History of Present Illness  Pt is a 72 yo female /sp L THA. PMH of sleep apnea, COPD, HTN, CHF, afib, breast cancer, R THA.  Clinical Impression  Pt wakes easily to voice, reported no pain during session and family/friend present for most of session as well. She reported independence at baseline with frequent falls due to her left hip.   The patient was able to perform several supine exercises with verbal and tactile cues. Supine to sit with extended time, supervision. Sit <> stand with RW and CGA, able to step pivot to commode, and perform pericare modi and don briefs with supervision. She ambulated ~76ft to recliner in room. She needed verbal cues for safe hand placement with fair carryover. Further mobility deferred due to pt dizziness (BP in sitting 140s/60s, RN notified of pt status).  Overall the patient demonstrated deficits (see "PT Problem List") that impede the patient's functional abilities, safety, and mobility and would benefit from skilled PT intervention.          If plan is discharge home, recommend the following: Assistance with cooking/housework;Assist for transportation;Help with stairs or ramp for entrance   Can travel by private vehicle        Equipment Recommendations None recommended by PT  Recommendations for Other Services       Functional Status Assessment Patient has had a recent decline in their functional status and demonstrates the ability to make significant improvements in function in a reasonable and predictable amount of time.     Precautions / Restrictions Precautions Precautions: Anterior Hip Precaution Booklet Issued: Yes (comment) Restrictions Weight Bearing Restrictions: Yes LLE Weight Bearing: Weight bearing as tolerated      Mobility  Bed Mobility Overal bed mobility: Needs Assistance Bed Mobility: Supine to Sit      Supine to sit: Supervision          Transfers Overall transfer level: Needs assistance Equipment used: Rolling walker (2 wheels) Transfers: Sit to/from Stand, Bed to chair/wheelchair/BSC Sit to Stand: Contact guard assist   Step pivot transfers: Contact guard assist            Ambulation/Gait Ambulation/Gait assistance: Contact guard assist Gait Distance (Feet): 3 Feet Assistive device: Rolling walker (2 wheels)         General Gait Details: true ambulation deferred due to pt dizziness  Stairs            Wheelchair Mobility     Tilt Bed    Modified Rankin (Stroke Patients Only)       Balance Overall balance assessment: Needs assistance Sitting-balance support: Feet supported Sitting balance-Leahy Scale: Good Sitting balance - Comments: seated pericare modI   Standing balance support: Bilateral upper extremity supported Standing balance-Leahy Scale: Fair                               Pertinent Vitals/Pain Pain Assessment Pain Assessment: No/denies pain    Home Living Family/patient expects to be discharged to:: Private residence Living Arrangements: Spouse/significant other Available Help at Discharge: Family;Friend(s);Available 24 hours/day Type of Home: Apartment Home Access: Level entry;Stairs to enter   Entrance Stairs-Number of Steps: 1 curb   Home Layout: One level Home Equipment: Agricultural consultant (2 wheels);Rollator (4 wheels);BSC/3in1;Wheelchair - power Additional Comments: 1 fall per week due to L hip giving out    Prior Function Prior  Level of Function : Independent/Modified Independent                     Extremity/Trunk Assessment   Upper Extremity Assessment Upper Extremity Assessment: Overall WFL for tasks assessed    Lower Extremity Assessment Lower Extremity Assessment:  (s/p L THA, but able to move BLE against gravity)       Communication   Communication Communication: No apparent  difficulties  Cognition Arousal: Alert Behavior During Therapy: WFL for tasks assessed/performed Overall Cognitive Status: Within Functional Limits for tasks assessed                                          General Comments      Exercises Total Joint Exercises Ankle Circles/Pumps: AROM, Both, 10 reps Quad Sets: AROM, Both, 10 reps Gluteal Sets: AROM, Both, 10 reps Heel Slides: AROM, Both, 10 reps   Assessment/Plan    PT Assessment Patient needs continued PT services  PT Problem List Decreased strength;Decreased range of motion;Pain;Decreased activity tolerance;Decreased balance;Decreased mobility;Decreased knowledge of precautions       PT Treatment Interventions DME instruction;Neuromuscular re-education;Gait training;Stair training;Patient/family education;Functional mobility training;Therapeutic activities;Therapeutic exercise;Balance training    PT Goals (Current goals can be found in the Care Plan section)  Acute Rehab PT Goals Patient Stated Goal: to go home PT Goal Formulation: With patient Time For Goal Achievement: 05/25/23 Potential to Achieve Goals: Good    Frequency BID     Co-evaluation               AM-PAC PT "6 Clicks" Mobility  Outcome Measure Help needed turning from your back to your side while in a flat bed without using bedrails?: A Little Help needed moving from lying on your back to sitting on the side of a flat bed without using bedrails?: A Little Help needed moving to and from a bed to a chair (including a wheelchair)?: A Little Help needed standing up from a chair using your arms (e.g., wheelchair or bedside chair)?: A Little Help needed to walk in hospital room?: A Little Help needed climbing 3-5 steps with a railing? : A Little 6 Click Score: 18    End of Session   Activity Tolerance: Patient tolerated treatment well Patient left: in chair;with call bell/phone within reach;with family/visitor present Nurse  Communication: Mobility status PT Visit Diagnosis: Other abnormalities of gait and mobility (R26.89);Muscle weakness (generalized) (M62.81);Pain;Difficulty in walking, not elsewhere classified (R26.2) Pain - Right/Left: Left Pain - part of body: Hip    Time: 8841-6606 PT Time Calculation (min) (ACUTE ONLY): 29 min   Charges:   PT Evaluation $PT Eval Low Complexity: 1 Low PT Treatments $Therapeutic Exercise: 8-22 mins $Therapeutic Activity: 8-22 mins PT General Charges $$ ACUTE PT VISIT: 1 Visit        Olga Coaster PT, DPT 3:37 PM,05/11/23

## 2023-05-12 ENCOUNTER — Encounter: Payer: Self-pay | Admitting: Orthopedic Surgery

## 2023-05-12 DIAGNOSIS — I130 Hypertensive heart and chronic kidney disease with heart failure and stage 1 through stage 4 chronic kidney disease, or unspecified chronic kidney disease: Secondary | ICD-10-CM | POA: Diagnosis not present

## 2023-05-12 DIAGNOSIS — J449 Chronic obstructive pulmonary disease, unspecified: Secondary | ICD-10-CM | POA: Diagnosis not present

## 2023-05-12 DIAGNOSIS — I5032 Chronic diastolic (congestive) heart failure: Secondary | ICD-10-CM | POA: Diagnosis not present

## 2023-05-12 DIAGNOSIS — I2510 Atherosclerotic heart disease of native coronary artery without angina pectoris: Secondary | ICD-10-CM | POA: Diagnosis not present

## 2023-05-12 DIAGNOSIS — N182 Chronic kidney disease, stage 2 (mild): Secondary | ICD-10-CM | POA: Diagnosis not present

## 2023-05-12 DIAGNOSIS — I480 Paroxysmal atrial fibrillation: Secondary | ICD-10-CM | POA: Diagnosis not present

## 2023-05-12 DIAGNOSIS — Z85828 Personal history of other malignant neoplasm of skin: Secondary | ICD-10-CM | POA: Diagnosis not present

## 2023-05-12 DIAGNOSIS — Z7901 Long term (current) use of anticoagulants: Secondary | ICD-10-CM | POA: Diagnosis not present

## 2023-05-12 DIAGNOSIS — Z96641 Presence of right artificial hip joint: Secondary | ICD-10-CM | POA: Diagnosis not present

## 2023-05-12 DIAGNOSIS — Z853 Personal history of malignant neoplasm of breast: Secondary | ICD-10-CM | POA: Diagnosis not present

## 2023-05-12 DIAGNOSIS — E039 Hypothyroidism, unspecified: Secondary | ICD-10-CM | POA: Diagnosis not present

## 2023-05-12 DIAGNOSIS — Z8616 Personal history of COVID-19: Secondary | ICD-10-CM | POA: Diagnosis not present

## 2023-05-12 DIAGNOSIS — Z79899 Other long term (current) drug therapy: Secondary | ICD-10-CM | POA: Diagnosis not present

## 2023-05-12 DIAGNOSIS — M1612 Unilateral primary osteoarthritis, left hip: Secondary | ICD-10-CM | POA: Diagnosis not present

## 2023-05-12 LAB — CBC
HCT: 33.6 % — ABNORMAL LOW (ref 36.0–46.0)
Hemoglobin: 11.7 g/dL — ABNORMAL LOW (ref 12.0–15.0)
MCH: 32.6 pg (ref 26.0–34.0)
MCHC: 34.8 g/dL (ref 30.0–36.0)
MCV: 93.6 fL (ref 80.0–100.0)
Platelets: 130 10*3/uL — ABNORMAL LOW (ref 150–400)
RBC: 3.59 MIL/uL — ABNORMAL LOW (ref 3.87–5.11)
RDW: 12.4 % (ref 11.5–15.5)
WBC: 13.5 10*3/uL — ABNORMAL HIGH (ref 4.0–10.5)
nRBC: 0 % (ref 0.0–0.2)

## 2023-05-12 LAB — BASIC METABOLIC PANEL
Anion gap: 8 (ref 5–15)
BUN: 24 mg/dL — ABNORMAL HIGH (ref 8–23)
CO2: 26 mmol/L (ref 22–32)
Calcium: 7.8 mg/dL — ABNORMAL LOW (ref 8.9–10.3)
Chloride: 100 mmol/L (ref 98–111)
Creatinine, Ser: 1.14 mg/dL — ABNORMAL HIGH (ref 0.44–1.00)
GFR, Estimated: 52 mL/min — ABNORMAL LOW (ref >60)
Glucose, Bld: 126 mg/dL — ABNORMAL HIGH (ref 70–99)
Potassium: 4.8 mmol/L (ref 3.5–5.1)
Sodium: 134 mmol/L — ABNORMAL LOW (ref 135–145)

## 2023-05-12 MED ORDER — ROSUVASTATIN CALCIUM 20 MG PO TABS
ORAL_TABLET | ORAL | Status: AC
  Filled 2023-05-12: qty 2

## 2023-05-12 MED ORDER — POTASSIUM CHLORIDE CRYS ER 20 MEQ PO TBCR
EXTENDED_RELEASE_TABLET | ORAL | Status: AC
  Filled 2023-05-12: qty 1

## 2023-05-12 MED ORDER — FUROSEMIDE 20 MG PO TABS
ORAL_TABLET | ORAL | Status: AC
Start: 2023-05-12 — End: ?
  Filled 2023-05-12: qty 1

## 2023-05-12 MED ORDER — DOCUSATE SODIUM 100 MG PO CAPS: 100.0000 mg | ORAL_CAPSULE | Freq: Two times a day (BID) | ORAL | 0 refills | Status: DC

## 2023-05-12 MED ORDER — ACETAMINOPHEN 500 MG PO TABS
ORAL_TABLET | ORAL | Status: AC
Start: 2023-05-12 — End: ?
  Filled 2023-05-12: qty 2

## 2023-05-12 MED ORDER — PANTOPRAZOLE SODIUM 40 MG PO TBEC
DELAYED_RELEASE_TABLET | ORAL | Status: AC
  Filled 2023-05-12: qty 1

## 2023-05-12 MED ORDER — LOSARTAN POTASSIUM 50 MG PO TABS
ORAL_TABLET | ORAL | Status: AC
  Filled 2023-05-12: qty 1

## 2023-05-12 MED ORDER — OXYCODONE HCL 5 MG PO TABS: 2.5000 mg | ORAL_TABLET | Freq: Four times a day (QID) | ORAL | 0 refills | Status: DC | PRN

## 2023-05-12 MED ORDER — ACETAMINOPHEN 500 MG PO TABS: 1000.0000 mg | ORAL_TABLET | Freq: Three times a day (TID) | ORAL | 0 refills | Status: DC

## 2023-05-12 MED ORDER — APIXABAN 2.5 MG PO TABS
ORAL_TABLET | ORAL | Status: AC
  Filled 2023-05-12: qty 2

## 2023-05-12 MED ORDER — TRAMADOL HCL 50 MG PO TABS: 50.0000 mg | ORAL_TABLET | Freq: Four times a day (QID) | ORAL | 0 refills | Status: DC | PRN

## 2023-05-12 MED ORDER — ACETAMINOPHEN 500 MG PO TABS
ORAL_TABLET | ORAL | Status: AC
  Filled 2023-05-12: qty 2

## 2023-05-12 MED ORDER — KETOROLAC TROMETHAMINE 15 MG/ML IJ SOLN
INTRAMUSCULAR | Status: AC
  Filled 2023-05-12: qty 1

## 2023-05-12 MED ORDER — ALPRAZOLAM 0.5 MG PO TABS
ORAL_TABLET | ORAL | Status: AC
  Filled 2023-05-12: qty 1

## 2023-05-12 MED ORDER — ONDANSETRON HCL 4 MG/2ML IJ SOLN
INTRAMUSCULAR | Status: AC
  Filled 2023-05-12: qty 2

## 2023-05-12 MED ORDER — DOCUSATE SODIUM 100 MG PO CAPS
ORAL_CAPSULE | ORAL | Status: AC
Start: 2023-05-12 — End: ?
  Filled 2023-05-12: qty 1

## 2023-05-12 NOTE — Progress Notes (Addendum)
Subjective: 1 Day Post-Op Procedure(s) (LRB): TOTAL HIP ARTHROPLASTY ANTERIOR APPROACH (Left) Patient reports pain as mild.   Bladder feels uncomfortable/full. Urinated once yesterday. Patient is well, and has had no acute complaints or problems Denies any CP, SOB, ABD pain. We will continue therapy today.  Plan is to go Home after hospital stay.  Objective: Vital signs in last 24 hours: Temp:  [97 F (36.1 C)-98 F (36.7 C)] 98 F (36.7 C) (10/31 1936) Pulse Rate:  [54-70] 68 (10/31 1936) Resp:  [11-18] 18 (10/31 1936) BP: (104-161)/(54-94) 116/68 (10/31 1936) SpO2:  [92 %-100 %] 96 % (10/31 1936)  Intake/Output from previous day: 10/31 0701 - 11/01 0700 In: 1100 [I.V.:600; IV Piggyback:500] Out: 850 [Urine:600; Blood:250] Intake/Output this shift: No intake/output data recorded.  Recent Labs    05/12/23 0517  HGB 11.7*   Recent Labs    05/12/23 0517  WBC 13.5*  RBC 3.59*  HCT 33.6*  PLT 130*   Recent Labs    05/12/23 0517  NA 134*  K 4.8  CL 100  CO2 26  BUN 24*  CREATININE 1.14*  GLUCOSE 126*  CALCIUM 7.8*   No results for input(s): "LABPT", "INR" in the last 72 hours.  EXAM General - Patient is Alert, Appropriate, and Oriented Extremity - Neurovascular intact Sensation intact distally Intact pulses distally Dorsiflexion/Plantar flexion intact No cellulitis present Compartment soft Dressing - dressing C/D/I and no drainage Motor Function - intact, moving foot and toes well on exam.   Past Medical History:  Diagnosis Date   Achilles tendinitis    Acute medial meniscal tear    Allergy    Anemia    Anxiety    a.) on BZO PRN (alprazolam)   Aortic atherosclerosis (HCC)    Ascending aorta dilatation (HCC) 10/20/2022   a.) TTE 10/20/2022: asc Ao 39 mm   Atrial fibrillation/flutter (HCC) 07/2016   a.) Dx'd 07/2016; b.) CHA2DS2-VASc = 5 (age, sex, HFmrEF, HTN, vasc disease history) as of 05/09/2023; c.) s/p DCCV 09/07/2016 (150 J x 2 -->  A.flutter; 200 J x 1 --> NSR); d.) s/p DCCV 10/19/2016 (150 J x 1 --> NSR); e.) brought in for DCCV 12/2016; cancelled d/t NSR; f.) s/p PVI ablation 12/12/2022; g.) cardiac rate/rhythm maintained on oral bisoprolol; chronically anticoagulated using apixaban   CAD (coronary artery disease)    a.) cCTA 12/08/2022: Ca2+ = 31.6 (58th %'ile for age/sex/race match control)   Chemotherapy induced nausea and vomiting 05/18/2021   Chronic heart failure with mid-range ejection fraction (HFmrEF) (HCC)    a.) TTE 03/08/21: EF 60-65, mild LVH, G1DD, mild LAE, triv MR; b.) TTE 07/29/21: EF 60-65, G1DD, mild LAE, triv MR; c.) TTE 11/26/21: EF 55-60, G1DD, mild LAE, mild MR; d.) TTE 02/25/22: EF 60-65, G1DD, mild LAE, mild MR; e.) TTE 05/31/22: EF 55-60, mild LVH, G2DD, mild-mod MR; f.) TTE 10/20/22: EF 45-50, mild LAE, mod red RVSF, mild-mod TR, Asc Ao 39; g.) TTE 12/13/22: EF 50-55, mild LVH, mild MAC   CKD (chronic kidney disease), stage II    Colon polyp    Complication of anesthesia    a.) emergence delirium; b.) hypoxia after hip replacement   COPD (chronic obstructive pulmonary disease) (HCC)    DDD (degenerative disc disease), lumbar    a.) s/p fusion   Depression    Endometriosis    Fibrocystic breast disease    GERD (gastroesophageal reflux disease)    History of stress test    a.) MV 07/29/2016: EF  55-65%. Small, mild defect  in mid anteroseptal, apical septal, and apical locations. No ischemia-->low risk; b.) MV 10/2022: mod sized partially rev apical ant, apical septal, and apical defect. EF 52%. No signific cor Ca2+; c.) cCTA 12/08/2022: Ca2+ = 31.6 (58th %'ile).   History of tobacco use    Hyperlipidemia    Hypertension    Hypothyroidism    a.) s/p partial thyroidectomy for nodule; pathology benign   Insomnia    Lipoma    Malignant neoplasm of upper-outer quadrant of right female breast (HCC) 03/03/2021   a.) stage 1A invasive mammary carcinoma (cT1b or T1c N0); ER (-), PR (+), HER2/neu (+); did not  tolerate adjuvant chemotherapy (paclitacel + trastuzumab) despite paclitaxel dose reduction; s/p adjuvant XRT; cinished maintenance trastuzumab 07/27/2022; no adjuvant endocrine therapy as no clear benefit   Migraine    Nephrolithiasis    On apixaban therapy    OSA (obstructive sleep apnea)    a.) non-compliant with nocturnal PAP therapy   Osteoarthritis    Osteopenia    Peptic ulcer disease    a.) H. pylori   Pericarditis 12/12/2022   a.) following A.fib ablation; resolved   Pneumonia    Port-A-Cath in place    RLS (restless legs syndrome)    a.) on ropinirole   Vitamin D deficiency     Assessment/Plan:   1 Day Post-Op Procedure(s) (LRB): TOTAL HIP ARTHROPLASTY ANTERIOR APPROACH (Left) Principal Problem:   S/P total left hip arthroplasty  Estimated body mass index is 40.02 kg/m as calculated from the following:   Height as of this encounter: 5\' 5"  (1.651 m).   Weight as of this encounter: 109.1 kg. Advance diet Up with therapy Pain well controlled  Labs and VSS  Bladder scan showed 400+ ml. Patient ambulated to restroom and voided 400+ ml.  CM to assist with discharge to SNF.      DVT Prophylaxis - Lovenox, TED hose, and SCDs Weight-Bearing as tolerated to left leg   T. Cranston Neighbor, PA-C [facility] 05/12/2023, 7:34 AM

## 2023-05-12 NOTE — TOC Initial Note (Signed)
Transition of Care [facility]) - Initial/Assessment Note    Patient Details  Name: Amber Barnes MRN: [id number] Date of Birth: 1950-12-12  Transition of Care [facility]) CM/SW Contact:    Marlowe Sax, RN Phone Number: 05/12/2023, 9:10 AM  Clinical Narrative:                 The patient lives at home with spouse, she has DME at home including Agricultural consultant (2 wheels);Rollator (4 wheels);BSC/3in1;Wheelchair - power  Set u-p with [facility] for [facility] prior to surgery by surgeons office  Expected Discharge Plan: Home w Home Health Services Barriers to Discharge: No Barriers Identified   Patient Goals and CMS Choice            Expected Discharge Plan and Services   Discharge Planning Services: CM Consult   Living arrangements for the past 2 months: Single Family Home Expected Discharge Date: 05/12/23               DME Arranged: N/A         HH Arranged: PT, OT HH Agency: [facility] Date HH Agency Contacted: 05/12/23 Time HH Agency Contacted: [phone] Representative spoke with at [facility] Agency: Kandee Keen  Prior Living Arrangements/Services Living arrangements for the past 2 months: Single Family Home Lives with:: Spouse Patient language and need for interpreter reviewed:: Yes Do you feel safe going back to the place where you live?: Yes      Need for Family Participation in Patient Care: Yes (Comment) Care giver support system in place?: Yes (comment) Current home services: DME (Rolling Walker (2 wheels);Rollator (4 wheels);BSC/3in1;Wheelchair - power) Criminal Activity/Legal Involvement Pertinent to Current Situation/Hospitalization: No - Comment as needed  Activities of Daily Living   ADL Screening (condition at time of admission) Independently performs ADLs?: Yes (appropriate for developmental age) Is the patient deaf or have difficulty hearing?: No Does the patient have difficulty seeing, even when wearing glasses/contacts?: No Does the patient have difficulty  concentrating, remembering, or making decisions?: No  Permission Sought/Granted   Permission granted to share information with : Yes, Verbal Permission Granted              Emotional Assessment Appearance:: Appears stated age Attitude/Demeanor/Rapport: Engaged Affect (typically observed): Accepting Orientation: : Oriented to Self, Oriented to Place, Oriented to  Time, Oriented to Situation Alcohol / Substance Use: Not Applicable Psych Involvement: No (comment)  Admission diagnosis:  S/P total left hip arthroplasty [W09.811] Patient Active Problem List   Diagnosis Date Noted   S/P total left hip arthroplasty 05/11/2023   Breast mass, right 12/13/2022   Myocardial injury 12/13/2022   Peptic ulcer 12/13/2022   Obesity, Class III, BMI 40-49.9 (morbid obesity) (HCC) 12/13/2022   PAF (paroxysmal atrial fibrillation) (HCC) 12/13/2022   Pericarditis 12/13/2022   Elevated TSH 09/12/2022   Basal cell carcinoma 08/10/2022   Hypocalcemia 03/23/2022   Primary localized osteoarthritis of pelvic region and thigh 03/02/2022   Displacement of lumbar intervertebral disc without myelopathy 03/02/2022   Left hip pain 01/05/2022   Dizziness 12/10/2021   Chronic pain of left knee 11/16/2021   Leg swelling 10/27/2021   Encounter for monoclonal antibody treatment for malignancy 09/29/2021   Ureterovesical junction (UVJ) obstruction 07/12/2021   Acute pyelonephritis 07/12/2021   CKD (chronic kidney disease), stage II 07/12/2021   HLD (hyperlipidemia) 07/12/2021   Chronic diastolic CHF (congestive heart failure) (HCC) 07/12/2021   Hydroureteronephrosis 07/12/2021   Pyelonephritis 07/02/2021   Drug-induced neutropenia (HCC) 06/23/2021   CKD (chronic kidney  disease) 06/21/2021   Chemotherapy induced diarrhea 06/09/2021   Anemia due to antineoplastic chemotherapy 05/18/2021   Chemotherapy induced nausea and vomiting 05/18/2021   COVID-19 04/30/2021   Neutropenia (HCC)    Anxiety     Intractable vomiting    Diarrhea    Neutropenic fever (HCC) 04/05/2021   Encounter for antineoplastic chemotherapy 03/24/2021   Neuropathy 03/24/2021   Port-A-Cath in place 03/24/2021   HER2-positive carcinoma of breast (HCC) 01/26/2021   Goals of care, counseling/discussion 01/26/2021   Malignant neoplasm of upper-outer quadrant of right female breast (HCC) 01/19/2021   Left arm pain 12/02/2020   Soft tissue mass 12/02/2020   Candidal intertrigo 09/09/2020   Urinary frequency 09/09/2020   Night sweat 03/31/2020   Family history of CLL (chronic lymphoid leukemia) 03/21/2020   Nasal drainage 12/18/2019   Sinus congestion 12/18/2019   Nausea 10/30/2019   Major depression in remission (HCC) 08/27/2018   Primary osteoarthritis involving multiple joints 08/27/2018   Atrial fibrillation (HCC) 08/27/2018   COPD (chronic obstructive pulmonary disease) (HCC) 08/27/2018   BMI 50.0-59.9, adult (HCC) 07/16/2018   Obesity (BMI 35.0-39.9 without comorbidity) 05/31/2018   Bradycardia 05/22/2017   Restless leg syndrome 04/13/2017   OSA (obstructive sleep apnea) 01/09/2017   A-fib (HCC) 11/28/2016   Hypokalemia 07/28/2016   Chest pain    Atrial fibrillation with RVR (HCC) 07/27/2016   Fatty liver disease, nonalcoholic 06/30/2016   Vertigo 60/45/4098   Family history of ovarian cancer 06/08/2016   GERD (gastroesophageal reflux disease) 05/05/2016   Obesity 11/11/2014   HTN (hypertension) 11/11/2014   Anxiety and depression 11/11/2014   Weight loss 11/11/2014   PCP:  Allegra Grana, FNP Pharmacy:   [facility] [phone] [place], [place] - [address] [place] [address] Phone: [phone] Fax: [phone]  [id number] [place], [place] - [address] [place] [address] Phone: [phone] Fax: [phone]  [facility] [address] [place] [address] Phone: [phone] Fax:  [phone]     Social Determinants of Health (SDOH) Social History: SDOH Screenings   Food Insecurity: No Food Insecurity (05/11/2023)  Housing: Low Risk  (05/11/2023)  Transportation Needs: No Transportation Needs (05/11/2023)  Utilities: Not At Risk (05/11/2023)  Alcohol Screen: Low Risk  (05/05/2023)  Depression (PHQ2-9): Medium Risk (03/14/2023)  Financial Resource Strain: Low Risk  (05/05/2023)  Physical Activity: Inactive (05/05/2023)  Social Connections: Socially Integrated (05/05/2023)  Stress: Stress Concern Present (05/05/2023)  Tobacco Use: Medium Risk (05/11/2023)   SDOH Interventions:     Readmission Risk Interventions    07/13/2021    1:04 PM 04/09/2021    1:02 PM  Readmission Risk Prevention Plan  Transportation Screening Complete Complete  PCP or Specialist Appt within 3-5 Days  Complete  HRI or Home Care Consult  Complete  Social Work Consult for Recovery Care Planning/Counseling  Complete  Palliative Care Screening  Not Applicable  Medication Review Oceanographer) Complete Complete  Palliative Care Screening Not Applicable   Skilled Nursing Facility Not Applicable

## 2023-05-12 NOTE — Progress Notes (Signed)
Physical Therapy Treatment Patient Details Name: Amber Barnes MRN: [id number] DOB: [birth year] Today's Date: 05/12/2023   History of Present Illness Pt is a 72 yo female /sp L THA. PMH of sleep apnea, COPD, HTN, CHF, afib, breast cancer, R THA.    PT Comments  Pt was sitting in recliner upon arrival. She is A and O x 4. Very cooperative and motivated.  Demonstrated safe abilities to stand and ambulate with RW. No LOB. Performed step, to simulate home entry without difficulty. Pt is progressing well and is cleared from an acute PT standpoint for safe DC home.    If plan is discharge home, recommend the following: Assistance with cooking/housework;Assist for transportation;Help with stairs or ramp for entrance     Equipment Recommendations  None recommended by PT       Precautions / Restrictions Precautions Precautions: Anterior Hip Precaution Booklet Issued: Yes (comment) Restrictions Weight Bearing Restrictions: Yes LLE Weight Bearing: Weight bearing as tolerated     Mobility  Bed Mobility Overal bed mobility: Modified Independent  General bed mobility comments: per RN staff. pt in recliner pre/post session    Transfers Overall transfer level: Needs assistance Equipment used: Rolling walker (2 wheels) Transfers: Sit to/from Stand Sit to Stand: Supervision   Ambulation/Gait Ambulation/Gait assistance: Modified independent (Device/Increase time) Gait Distance (Feet): 200 Feet Assistive device: Rolling walker (2 wheels) Gait Pattern/deviations: Step-through pattern Gait velocity: decreased  General Gait Details: Pt easily and safely able to ambulate 200 ft without LOB   Stairs Stairs: Yes Stairs assistance: Supervision Stair Management: No rails, Step to pattern, With walker, Forwards, Backwards Number of Stairs: 1 General stair comments: pt demonstrates safe abilities to ascend/descend step to simulate home environment    Balance Overall balance assessment:  Modified Independent      Cognition Arousal: Alert Behavior During Therapy: WFL for tasks assessed/performed Overall Cognitive Status: Within Functional Limits for tasks assessed        General Comments General comments (skin integrity, edema, etc.): Reviewed car transfers, importance of routine mobility + HEP, and expectations post acute PT wise      Pertinent Vitals/Pain Pain Assessment Pain Assessment: 0-10 Pain Score: 1  Pain Location: hip Pain Descriptors / Indicators: Sore Pain Intervention(s): Limited activity within patient's tolerance, Monitored during session, Ice applied     PT Goals (current goals can now be found in the care plan section) Acute Rehab PT Goals Patient Stated Goal: to go home Progress towards PT goals: Progressing toward goals    Frequency    BID       AM-PAC PT "6 Clicks" Mobility   Outcome Measure  Help needed turning from your back to your side while in a flat bed without using bedrails?: A Little Help needed moving from lying on your back to sitting on the side of a flat bed without using bedrails?: A Little Help needed moving to and from a bed to a chair (including a wheelchair)?: A Little Help needed standing up from a chair using your arms (e.g., wheelchair or bedside chair)?: A Little Help needed to walk in hospital room?: A Little Help needed climbing 3-5 steps with a railing? : A Little 6 Click Score: 18    End of Session   Activity Tolerance: Patient tolerated treatment well Patient left: in chair;with call bell/phone within reach;with family/visitor present Nurse Communication: Mobility status PT Visit Diagnosis: Other abnormalities of gait and mobility (R26.89);Muscle weakness (generalized) (M62.81);Pain;Difficulty in walking, not elsewhere classified (R26.2) Pain - Right/Left: Left  Pain - part of body: Hip     Time: 0750-0808 PT Time Calculation (min) (ACUTE ONLY): 18 min  Charges:    $Gait Training: 8-22 mins PT  General Charges $$ ACUTE PT VISIT: 1 Visit                     Jetta Lout PTA 05/12/23, 8:41 AM

## 2023-05-12 NOTE — Discharge Summary (Signed)
Physician Discharge Summary  Patient ID: Amber Barnes MRN: [id number] DOB/AGE: 70-Oct-1952 70 y.o.  Admit date: 05/11/2023 Discharge date: 05/12/2023  Admission Diagnoses:  S/P total left hip arthroplasty [Z36.644]   Discharge Diagnoses: Patient Active Problem List   Diagnosis Date Noted   S/P total left hip arthroplasty 05/11/2023   Breast mass, right 12/13/2022   Myocardial injury 12/13/2022   Peptic ulcer 12/13/2022   Obesity, Class III, BMI 40-49.9 (morbid obesity) (HCC) 12/13/2022   PAF (paroxysmal atrial fibrillation) (HCC) 12/13/2022   Pericarditis 12/13/2022   Elevated TSH 09/12/2022   Basal cell carcinoma 08/10/2022   Hypocalcemia 03/23/2022   Primary localized osteoarthritis of pelvic region and thigh 03/02/2022   Displacement of lumbar intervertebral disc without myelopathy 03/02/2022   Left hip pain 01/05/2022   Dizziness 12/10/2021   Chronic pain of left knee 11/16/2021   Leg swelling 10/27/2021   Encounter for monoclonal antibody treatment for malignancy 09/29/2021   Ureterovesical junction (UVJ) obstruction 07/12/2021   Acute pyelonephritis 07/12/2021   CKD (chronic kidney disease), stage II 07/12/2021   HLD (hyperlipidemia) 07/12/2021   Chronic diastolic CHF (congestive heart failure) (HCC) 07/12/2021   Hydroureteronephrosis 07/12/2021   Pyelonephritis 07/02/2021   Drug-induced neutropenia (HCC) 06/23/2021   CKD (chronic kidney disease) 06/21/2021   Chemotherapy induced diarrhea 06/09/2021   Anemia due to antineoplastic chemotherapy 05/18/2021   Chemotherapy induced nausea and vomiting 05/18/2021   COVID-19 04/30/2021   Neutropenia (HCC)    Anxiety    Intractable vomiting    Diarrhea    Neutropenic fever (HCC) 04/05/2021   Encounter for antineoplastic chemotherapy 03/24/2021   Neuropathy 03/24/2021   Port-A-Cath in place 03/24/2021   HER2-positive carcinoma of breast (HCC) 01/26/2021   Goals of care, counseling/discussion 01/26/2021   Malignant  neoplasm of upper-outer quadrant of right female breast (HCC) 01/19/2021   Left arm pain 12/02/2020   Soft tissue mass 12/02/2020   Candidal intertrigo 09/09/2020   Urinary frequency 09/09/2020   Night sweat 03/31/2020   Family history of CLL (chronic lymphoid leukemia) 03/21/2020   Nasal drainage 12/18/2019   Sinus congestion 12/18/2019   Nausea 10/30/2019   Major depression in remission (HCC) 08/27/2018   Primary osteoarthritis involving multiple joints 08/27/2018   Atrial fibrillation (HCC) 08/27/2018   COPD (chronic obstructive pulmonary disease) (HCC) 08/27/2018   BMI 50.0-59.9, adult (HCC) 07/16/2018   Obesity (BMI 35.0-39.9 without comorbidity) 05/31/2018   Bradycardia 05/22/2017   Restless leg syndrome 04/13/2017   OSA (obstructive sleep apnea) 01/09/2017   A-fib (HCC) 11/28/2016   Hypokalemia 07/28/2016   Chest pain    Atrial fibrillation with RVR (HCC) 07/27/2016   Fatty liver disease, nonalcoholic 06/30/2016   Vertigo 03/47/4259   Family history of ovarian cancer 06/08/2016   GERD (gastroesophageal reflux disease) 05/05/2016   Obesity 11/11/2014   HTN (hypertension) 11/11/2014   Anxiety and depression 11/11/2014   Weight loss 11/11/2014    Past Medical History:  Diagnosis Date   Achilles tendinitis    Acute medial meniscal tear    Allergy    Anemia    Anxiety    a.) on BZO PRN (alprazolam)   Aortic atherosclerosis (HCC)    Ascending aorta dilatation (HCC) 10/20/2022   a.) TTE 10/20/2022: asc Ao 39 mm   Atrial fibrillation/flutter (HCC) 07/2016   a.) Dx'd 07/2016; b.) CHA2DS2-VASc = 5 (age, sex, HFmrEF, HTN, vasc disease history) as of 05/09/2023; c.) s/p DCCV 09/07/2016 (150 J x 2 --> A.flutter; 200 J x 1 --> NSR); d.) s/p  DCCV 10/19/2016 (150 J x 1 --> NSR); e.) brought in for DCCV 12/2016; cancelled d/t NSR; f.) s/p PVI ablation 12/12/2022; g.) cardiac rate/rhythm maintained on oral bisoprolol; chronically anticoagulated using apixaban   CAD (coronary artery  disease)    a.) cCTA 12/08/2022: Ca2+ = 31.6 (58th %'ile for age/sex/race match control)   Chemotherapy induced nausea and vomiting 05/18/2021   Chronic heart failure with mid-range ejection fraction (HFmrEF) (HCC)    a.) TTE 03/08/21: EF 60-65, mild LVH, G1DD, mild LAE, triv MR; b.) TTE 07/29/21: EF 60-65, G1DD, mild LAE, triv MR; c.) TTE 11/26/21: EF 55-60, G1DD, mild LAE, mild MR; d.) TTE 02/25/22: EF 60-65, G1DD, mild LAE, mild MR; e.) TTE 05/31/22: EF 55-60, mild LVH, G2DD, mild-mod MR; f.) TTE 10/20/22: EF 45-50, mild LAE, mod red RVSF, mild-mod TR, Asc Ao 39; g.) TTE 12/13/22: EF 50-55, mild LVH, mild MAC   CKD (chronic kidney disease), stage II    Colon polyp    Complication of anesthesia    a.) emergence delirium; b.) hypoxia after hip replacement   COPD (chronic obstructive pulmonary disease) (HCC)    DDD (degenerative disc disease), lumbar    a.) s/p fusion   Depression    Endometriosis    Fibrocystic breast disease    GERD (gastroesophageal reflux disease)    History of stress test    a.) MV 07/29/2016: EF 55-65%. Small, mild defect  in mid anteroseptal, apical septal, and apical locations. No ischemia-->low risk; b.) MV 10/2022: mod sized partially rev apical ant, apical septal, and apical defect. EF 52%. No signific cor Ca2+; c.) cCTA 12/08/2022: Ca2+ = 31.6 (58th %'ile).   History of tobacco use    Hyperlipidemia    Hypertension    Hypothyroidism    a.) s/p partial thyroidectomy for nodule; pathology benign   Insomnia    Lipoma    Malignant neoplasm of upper-outer quadrant of right female breast (HCC) 03/03/2021   a.) stage 1A invasive mammary carcinoma (cT1b or T1c N0); ER (-), PR (+), HER2/neu (+); did not tolerate adjuvant chemotherapy (paclitacel + trastuzumab) despite paclitaxel dose reduction; s/p adjuvant XRT; cinished maintenance trastuzumab 07/27/2022; no adjuvant endocrine therapy as no clear benefit   Migraine    Nephrolithiasis    On apixaban therapy    OSA (obstructive  sleep apnea)    a.) non-compliant with nocturnal PAP therapy   Osteoarthritis    Osteopenia    Peptic ulcer disease    a.) H. pylori   Pericarditis 12/12/2022   a.) following A.fib ablation; resolved   Pneumonia    Port-A-Cath in place    RLS (restless legs syndrome)    a.) on ropinirole   Vitamin D deficiency      Transfusion: none   Consultants (if any):   Discharged Condition: Improved  Hospital Course: Amber Barnes is an 72 y.o. female who was admitted 05/11/2023 with a diagnosis of S/P total left hip arthroplasty and went to the operating room on 05/11/2023 and underwent the above named procedures.    Surgeries: Procedure(s): TOTAL HIP ARTHROPLASTY ANTERIOR APPROACH on 05/11/2023 Patient tolerated the surgery well. Taken to PACU where she was stabilized and then transferred to the orthopedic floor.  Started on Eliquis, TEDs and SCDs applied bilaterally. Heels elevated on bed. No evidence of DVT. Negative Homan. Physical therapy started on day #1 for gait training and transfer. OT started day #1 for ADL and assisted devices.  Patient's IV was d/c on day #1. Patient was able  to safely and independently complete all PT goals. PT recommending discharge to home.    On post op day #1 patient was stable and ready for discharge to home with HHPT.  Implants: Cup: Trident Tritanium Clusterhole 23mm/D w/ x2 screws    Liner: Neutral X3 poly 36/D  Stem: Insignia #4 high offset  Head:biolox ceramic 36mm +67mm   She was given perioperative antibiotics:  Anti-infectives (From admission, onward)    Start     Dose/Rate Route Frequency Ordered Stop   05/11/23 1330  ceFAZolin (ANCEF) IVPB 2g/100 mL premix        2 g 200 mL/hr over 30 Minutes Intravenous Every 6 hours 05/11/23 1037 05/11/23 2121   05/11/23 0615  ceFAZolin (ANCEF) IVPB 2g/100 mL premix        2 g 200 mL/hr over 30 Minutes Intravenous On call to O.R. 05/11/23 4098 05/11/23 0813     .  She was given sequential  compression devices, early ambulation, and Lovenox TEDs for DVT prophylaxis.  She benefited maximally from the hospital stay and there were no complications.    Recent vital signs:  Vitals:   05/11/23 1748 05/11/23 1936  BP: (!) 144/73 116/68  Pulse: 70 68  Resp: 16 18  Temp: 97.9 F (36.6 C) 98 F (36.7 C)  SpO2: 95% 96%    Recent laboratory studies:  Lab Results  Component Value Date   HGB 11.7 (L) 05/12/2023   HGB 13.4 02/14/2023   HGB 11.0 (L) 12/14/2022   Lab Results  Component Value Date   WBC 13.5 (H) 05/12/2023   PLT 130 (L) 05/12/2023   Lab Results  Component Value Date   INR 1.2 12/13/2022   Lab Results  Component Value Date   NA 134 (L) 05/12/2023   K 4.8 05/12/2023   CL 100 05/12/2023   CO2 26 05/12/2023   BUN 24 (H) 05/12/2023   CREATININE 1.14 (H) 05/12/2023   GLUCOSE 126 (H) 05/12/2023    Discharge Medications:   Allergies as of 05/12/2023       Reactions   Hydrocodone    Prednisone Other (See Comments)   Agitation, hyperactivity, polyphagia   Amiodarone Nausea And Vomiting, Other (See Comments)   Hydrocodone-acetaminophen Other (See Comments)   Hallucinations and combative   Tapentadol Itching   Adhesive [tape] Itching, Rash   Prefers paper tape   Latex Itching, Rash   use paper tap   Oxycodone Itching   Can take it with benadryl   Tramadol Itching   Can take it with benadryl   Wound Dressing Adhesive         Medication List     STOP taking these medications    metFORMIN 500 MG 24 hr tablet Commonly known as: GLUCOPHAGE-XR       TAKE these medications    acetaminophen 500 MG tablet Commonly known as: TYLENOL Take 2 tablets (1,000 mg total) by mouth every 8 (eight) hours. What changed:  how much to take when to take this reasons to take this   ALPRAZolam 0.25 MG tablet Commonly known as: XANAX TAKE 1 TABLET BY MOUTH TWICE A DAY AS NEEDED FOR ANXIETY What changed: See the new instructions.   bisoprolol 10 MG  tablet Commonly known as: ZEBETA Take 1 tablet (10 mg total) by mouth 2 (two) times daily.   busPIRone 5 MG tablet Commonly known as: BUSPAR TAKE 1 TABLET BY MOUTH THREE TIMES A DAY AS NEEDED What changed: when to take this  diphenhydrAMINE 25 mg capsule Commonly known as: BENADRYL Take 1 capsule (25 mg total) by mouth every 8 (eight) hours as needed for itching or allergies.   docusate sodium 100 MG capsule Commonly known as: COLACE Take 1 capsule (100 mg total) by mouth 2 (two) times daily.   Eliquis 5 MG Tabs tablet Generic drug: apixaban TAKE 1 TABLET BY MOUTH TWICE A DAY   escitalopram 10 MG tablet Commonly known as: LEXAPRO TAKE 1 TABLET BY MOUTH EVERY DAY   furosemide 20 MG tablet Commonly known as: LASIX Take 1 tablet (20 mg total) by mouth daily. May take additional 20mg  PO prn for leg swelling   losartan-hydrochlorothiazide 50-12.5 MG tablet Commonly known as: HYZAAR Take 1 tablet by mouth daily as needed (for BP 140/80).   multivitamin tablet Take 1 tablet by mouth daily.   nystatin cream Commonly known as: MYCOSTATIN Apply 1 application. topically 2 (two) times daily. Prn abdomen What changed:  when to take this reasons to take this   ondansetron 8 MG tablet Commonly known as: ZOFRAN Take 1 tablet (8 mg total) by mouth every 8 (eight) hours as needed.   oxyCODONE 5 MG immediate release tablet Commonly known as: Oxy IR/ROXICODONE Take 0.5-1 tablets (2.5-5 mg total) by mouth every 6 (six) hours as needed for breakthrough pain or severe pain (pain score 7-10).   potassium chloride SA 20 MEQ tablet Commonly known as: KLOR-CON M Take 1 tablet (20 mEq total) by mouth daily. What changed:  when to take this additional instructions   ranolazine 500 MG 12 hr tablet Commonly known as: RANEXA TAKE 1 TABLET BY MOUTH TWICE A DAY   rOPINIRole 2 MG tablet Commonly known as: REQUIP TAKE 1 TABLET BY MOUTH EVERYDAY AT BEDTIME   rOPINIRole 1 MG  tablet Commonly known as: REQUIP Take 1 tablet (1 mg total) by mouth at bedtime.   rosuvastatin 40 MG tablet Commonly known as: CRESTOR TAKE 1 TABLET BY MOUTH EVERY DAY   traMADol 50 MG tablet Commonly known as: ULTRAM Take 1 tablet (50 mg total) by mouth every 6 (six) hours as needed for moderate pain (pain score 4-6).   Vitamin D3 125 MCG (5000 UT) Caps Take 5,000 Units by mouth daily.        Diagnostic Studies: DG HIP UNILAT WITH PELVIS 1V LEFT  Result Date: 05/11/2023 CLINICAL DATA:  Postoperative total left hip arthroplasty. Intraoperative fluoroscopy. EXAM: DG HIP (WITH OR WITHOUT PELVIS) 1V*L* COMPARISON:  CT abdomen and pelvis 07/12/2021 FINDINGS: Images were performed intraoperatively without the presence of a radiologist. The patient is undergoing total left hip arthroplasty. Redemonstration of total right hip arthroplasty, partially visualized. No hardware complication is seen. Total fluoroscopy images: 4 Total fluoroscopy time: 18 seconds Total dose: Radiation Exposure Index (as provided by the fluoroscopic device): 3.2 mGy air Kerma Please see intraoperative findings for further detail. IMPRESSION: Intraoperative fluoroscopy for total left hip arthroplasty. Electronically Signed   By: Neita Garnet M.D.   On: 05/11/2023 11:46   DG C-Arm 1-60 Min-No Report  Result Date: 05/11/2023 Fluoroscopy was utilized by the requesting physician.  No radiographic interpretation.   DG C-Arm 1-60 Min-No Report  Result Date: 05/11/2023 Fluoroscopy was utilized by the requesting physician.  No radiographic interpretation.   MM 3D DIAGNOSTIC MAMMOGRAM UNILATERAL RIGHT BREAST  Result Date: 05/05/2023 CLINICAL DATA:  RIGHT breast pain. History of invasive mammary carcinoma in the RIGHT breast with lumpectomy in August 2022 with negative margins. Status post radiation. Recent ultrasound-guided biopsy  in June 2024 which demonstrated organizing fat necrosis at the site of developing  asymmetry in the RIGHT breast (RIBBON clip). EXAM: DIGITAL DIAGNOSTIC UNILATERAL RIGHT MAMMOGRAM WITH TOMOSYNTHESIS AND CAD; ULTRASOUND RIGHT BREAST LIMITED TECHNIQUE: Right digital diagnostic mammography and breast tomosynthesis was performed. The images were evaluated with computer-aided detection. ; Targeted ultrasound examination of the right breast was performed Best images possible per technologist communication. COMPARISON:  Previous exam(s). ACR Breast Density Category b: There are scattered areas of fibroglandular density. FINDINGS: There is density and architectural distortion within the RIGHT breast, consistent with postsurgical changes. These are stable in comparison to prior. RIGHT breast focal asymmetry with associated RIBBON clip at site of prior benign biopsy demonstrating fat necrosis. No suspicious mass, distortion, or microcalcifications are identified to suggest presence of malignancy. On physical exam, no suspicious mass is appreciated. Targeted ultrasound was performed of the site of painful concern in the RIGHT upper breast. No suspicious cystic or solid mass is seen at the site of painful concern in the upper breast. IMPRESSION: 1. No mammographic or sonographic evidence of malignancy at the site of painful concern in the RIGHT breast. Any further workup of the patient's symptoms should be based on the clinical assessment. 2. Stable postsurgical changes of the RIGHT breast. No mammographic evidence of malignancy. 3. Breast pain is a common condition, which will often resolve on its own without intervention. It can be affected by hormonal changes, medication side effect, weight changes and fit of the bra. Pain may also be referred from other adjacent areas of the body. Breast pain may be improved by wearing adequate well-fitting support, over-the-counter topical and oral NSAID medication, low-fat diet, and ice/heat as needed. Studies have shown an improvement in cyclic pain with use of evening  primrose oil, vitamin D and vitamin E. Clinical follow-up recommended to discuss any further work-up recommendations and appropriate treatment. RECOMMENDATION: Recommend bilateral diagnostic mammogram (with RIGHT and LEFT breast ultrasound if deemed necessary) in June 2025. I have discussed the findings and recommendations with the patient. If applicable, a reminder letter will be sent to the patient regarding the next appointment. BI-RADS CATEGORY  2: Benign. Electronically Signed   By: Meda Klinefelter M.D.   On: 05/05/2023 14:07   [place] LIMITED ULTRASOUND INCLUDING AXILLA RIGHT BREAST  Result Date: 05/05/2023 CLINICAL DATA:  RIGHT breast pain. History of invasive mammary carcinoma in the RIGHT breast with lumpectomy in August 2022 with negative margins. Status post radiation. Recent ultrasound-guided biopsy in June 2024 which demonstrated organizing fat necrosis at the site of developing asymmetry in the RIGHT breast (RIBBON clip). EXAM: DIGITAL DIAGNOSTIC UNILATERAL RIGHT MAMMOGRAM WITH TOMOSYNTHESIS AND CAD; ULTRASOUND RIGHT BREAST LIMITED TECHNIQUE: Right digital diagnostic mammography and breast tomosynthesis was performed. The images were evaluated with computer-aided detection. ; Targeted ultrasound examination of the right breast was performed Best images possible per technologist communication. COMPARISON:  Previous exam(s). ACR Breast Density Category b: There are scattered areas of fibroglandular density. FINDINGS: There is density and architectural distortion within the RIGHT breast, consistent with postsurgical changes. These are stable in comparison to prior. RIGHT breast focal asymmetry with associated RIBBON clip at site of prior benign biopsy demonstrating fat necrosis. No suspicious mass, distortion, or microcalcifications are identified to suggest presence of malignancy. On physical exam, no suspicious mass is appreciated. Targeted ultrasound was performed of the site of painful concern in  the RIGHT upper breast. No suspicious cystic or solid mass is seen at the site of painful concern in the  upper breast. IMPRESSION: 1. No mammographic or sonographic evidence of malignancy at the site of painful concern in the RIGHT breast. Any further workup of the patient's symptoms should be based on the clinical assessment. 2. Stable postsurgical changes of the RIGHT breast. No mammographic evidence of malignancy. 3. Breast pain is a common condition, which will often resolve on its own without intervention. It can be affected by hormonal changes, medication side effect, weight changes and fit of the bra. Pain may also be referred from other adjacent areas of the body. Breast pain may be improved by wearing adequate well-fitting support, over-the-counter topical and oral NSAID medication, low-fat diet, and ice/heat as needed. Studies have shown an improvement in cyclic pain with use of evening primrose oil, vitamin D and vitamin E. Clinical follow-up recommended to discuss any further work-up recommendations and appropriate treatment. RECOMMENDATION: Recommend bilateral diagnostic mammogram (with RIGHT and LEFT breast ultrasound if deemed necessary) in June 2025. I have discussed the findings and recommendations with the patient. If applicable, a reminder letter will be sent to the patient regarding the next appointment. BI-RADS CATEGORY  2: Benign. Electronically Signed   By: Meda Klinefelter M.D.   On: 05/05/2023 14:07    Disposition:      Follow-up Information     Evon Slack, PA-C Follow up in 2 week(s).   Specialties: Orthopedic Surgery, Emergency Medicine Contact information: [address] [place] [address] [phone]                  Signed: Patience Musca 05/12/2023, 7:50 AM

## 2023-05-12 NOTE — Discharge Instructions (Addendum)
Instructions after Anterior Total Hip Replacement        Dr. Regenia Skeeter., M.D.      Dept. of Orthopaedics & Sports Medicine  [facility]  [address]  [place], [place]  [address]  Phone: [phone]   Fax: [phone]    DIET: Drink plenty of non-alcoholic fluids. Resume your normal diet. Include foods high in fiber.  ACTIVITY:  You may use crutches or a walker with weight-bearing as tolerated, unless instructed otherwise. You may be weaned off of the walker or crutches by your Physical Therapist.  Continue doing gentle exercises. Exercising will reduce the pain and swelling, increase motion, and prevent muscle weakness.   Please continue to use the TED compression stockings for 2 weeks. You may remove the stockings at night, but should reapply them in the morning. Do not drive or operate any equipment until instructed.  WOUND CARE:  Continue to use ice packs periodically to reduce pain and swelling. You may shower with honeycomb dressing 3 days after your surgery. Do not submerge incision site under water. Remove honeycomb dressing 7 days after surgery and allow dermabond to fall off on its own.   MEDICATIONS: You may resume your regular medications. Please take the pain medication as prescribed on the medication list. Do not take pain medication on an empty stomach. You have been given a prescription for a blood thinner to prevent blood clots. Please take the medication (Eliquis) as instructed.  Pain medications and iron supplements can cause constipation. Use a stool softener (Senokot or Colace) on a daily basis and a laxative (dulcolax or miralax) as needed. Do not drive or drink alcoholic beverages when taking pain medications.  POSTOPERATIVE CONSTIPATION PROTOCOL Constipation - defined medically as fewer than three stools per week and severe constipation as less than one stool per week.  One of the most common issues patients have following surgery  is constipation.  Even if you have a regular bowel pattern at home, your normal regimen is likely to be disrupted due to multiple reasons following surgery.  Combination of anesthesia, postoperative narcotics, change in appetite and fluid intake all can affect your bowels.  In order to avoid complications following surgery, here are some recommendations in order to help you during your recovery period.  Colace (docusate) - Pick up an over-the-counter form of Colace or another stool softener and take twice a day as long as you are requiring postoperative pain medications.  Take with a full glass of water daily.  If you experience loose stools or diarrhea, hold the colace until you stool forms back up.  If your symptoms do not get better within 1 week or if they get worse, check with your doctor.  Dulcolax (bisacodyl) - Pick up over-the-counter and take as directed by the product packaging as needed to assist with the movement of your bowels.  Take with a full glass of water.  Use this product as needed if not relieved by Colace only.   MiraLax (polyethylene glycol) - Pick up over-the-counter to have on hand.  MiraLax is a solution that will increase the amount of water in your bowels to assist with bowel movements.  Take as directed and can mix with a glass of water, juice, soda, coffee, or tea.  Take if you go more than two days without a movement. Do not use MiraLax more than once per day. Call your doctor if you are still constipated or irregular after using this medication for 7 days  in a row.  If you continue to have problems with postoperative constipation, please contact the office for further assistance and recommendations.  If you experience "the worst abdominal pain ever" or develop nausea or vomiting, please contact the office immediatly for further recommendations for treatment.   CALL THE OFFICE FOR: Temperature above 101 degrees Excessive bleeding or drainage on the dressing. Excessive  swelling, coldness, or paleness of the toes. Persistent nausea and vomiting.  FOLLOW-UP:  You should have an appointment to return to the office in 2 weeks after surgery. Arrangements have been made for continuation of Physical Therapy (either home therapy or outpatient therapy).

## 2023-05-12 NOTE — Plan of Care (Signed)
Patient progressing 

## 2023-05-14 DIAGNOSIS — I130 Hypertensive heart and chronic kidney disease with heart failure and stage 1 through stage 4 chronic kidney disease, or unspecified chronic kidney disease: Secondary | ICD-10-CM | POA: Diagnosis not present

## 2023-05-14 DIAGNOSIS — N182 Chronic kidney disease, stage 2 (mild): Secondary | ICD-10-CM | POA: Diagnosis not present

## 2023-05-14 DIAGNOSIS — I5042 Chronic combined systolic (congestive) and diastolic (congestive) heart failure: Secondary | ICD-10-CM | POA: Diagnosis not present

## 2023-05-14 DIAGNOSIS — Z96642 Presence of left artificial hip joint: Secondary | ICD-10-CM | POA: Diagnosis not present

## 2023-05-14 DIAGNOSIS — Z471 Aftercare following joint replacement surgery: Secondary | ICD-10-CM | POA: Diagnosis not present

## 2023-05-15 DIAGNOSIS — N182 Chronic kidney disease, stage 2 (mild): Secondary | ICD-10-CM | POA: Diagnosis not present

## 2023-05-15 DIAGNOSIS — I130 Hypertensive heart and chronic kidney disease with heart failure and stage 1 through stage 4 chronic kidney disease, or unspecified chronic kidney disease: Secondary | ICD-10-CM | POA: Diagnosis not present

## 2023-05-15 DIAGNOSIS — Z471 Aftercare following joint replacement surgery: Secondary | ICD-10-CM | POA: Diagnosis not present

## 2023-05-15 DIAGNOSIS — Z96642 Presence of left artificial hip joint: Secondary | ICD-10-CM | POA: Diagnosis not present

## 2023-05-15 DIAGNOSIS — I5042 Chronic combined systolic (congestive) and diastolic (congestive) heart failure: Secondary | ICD-10-CM | POA: Diagnosis not present

## 2023-05-15 LAB — SURGICAL PATHOLOGY

## 2023-05-18 ENCOUNTER — Encounter: Payer: Self-pay | Admitting: Internal Medicine

## 2023-05-18 DIAGNOSIS — Z471 Aftercare following joint replacement surgery: Secondary | ICD-10-CM | POA: Diagnosis not present

## 2023-05-18 DIAGNOSIS — Z96642 Presence of left artificial hip joint: Secondary | ICD-10-CM | POA: Diagnosis not present

## 2023-05-18 DIAGNOSIS — I5042 Chronic combined systolic (congestive) and diastolic (congestive) heart failure: Secondary | ICD-10-CM | POA: Diagnosis not present

## 2023-05-18 DIAGNOSIS — N182 Chronic kidney disease, stage 2 (mild): Secondary | ICD-10-CM | POA: Diagnosis not present

## 2023-05-18 DIAGNOSIS — I130 Hypertensive heart and chronic kidney disease with heart failure and stage 1 through stage 4 chronic kidney disease, or unspecified chronic kidney disease: Secondary | ICD-10-CM | POA: Diagnosis not present

## 2023-05-19 DIAGNOSIS — Z471 Aftercare following joint replacement surgery: Secondary | ICD-10-CM | POA: Diagnosis not present

## 2023-05-19 DIAGNOSIS — I5042 Chronic combined systolic (congestive) and diastolic (congestive) heart failure: Secondary | ICD-10-CM | POA: Diagnosis not present

## 2023-05-19 DIAGNOSIS — I130 Hypertensive heart and chronic kidney disease with heart failure and stage 1 through stage 4 chronic kidney disease, or unspecified chronic kidney disease: Secondary | ICD-10-CM | POA: Diagnosis not present

## 2023-05-19 DIAGNOSIS — N182 Chronic kidney disease, stage 2 (mild): Secondary | ICD-10-CM | POA: Diagnosis not present

## 2023-05-19 DIAGNOSIS — Z96642 Presence of left artificial hip joint: Secondary | ICD-10-CM | POA: Diagnosis not present

## 2023-05-22 DIAGNOSIS — I130 Hypertensive heart and chronic kidney disease with heart failure and stage 1 through stage 4 chronic kidney disease, or unspecified chronic kidney disease: Secondary | ICD-10-CM | POA: Diagnosis not present

## 2023-05-22 DIAGNOSIS — N182 Chronic kidney disease, stage 2 (mild): Secondary | ICD-10-CM | POA: Diagnosis not present

## 2023-05-22 DIAGNOSIS — Z471 Aftercare following joint replacement surgery: Secondary | ICD-10-CM | POA: Diagnosis not present

## 2023-05-22 DIAGNOSIS — Z96642 Presence of left artificial hip joint: Secondary | ICD-10-CM | POA: Diagnosis not present

## 2023-05-22 DIAGNOSIS — I5042 Chronic combined systolic (congestive) and diastolic (congestive) heart failure: Secondary | ICD-10-CM | POA: Diagnosis not present

## 2023-05-24 DIAGNOSIS — N182 Chronic kidney disease, stage 2 (mild): Secondary | ICD-10-CM | POA: Diagnosis not present

## 2023-05-24 DIAGNOSIS — I130 Hypertensive heart and chronic kidney disease with heart failure and stage 1 through stage 4 chronic kidney disease, or unspecified chronic kidney disease: Secondary | ICD-10-CM | POA: Diagnosis not present

## 2023-05-24 DIAGNOSIS — I5042 Chronic combined systolic (congestive) and diastolic (congestive) heart failure: Secondary | ICD-10-CM | POA: Diagnosis not present

## 2023-05-24 DIAGNOSIS — Z96642 Presence of left artificial hip joint: Secondary | ICD-10-CM | POA: Diagnosis not present

## 2023-05-24 DIAGNOSIS — Z471 Aftercare following joint replacement surgery: Secondary | ICD-10-CM | POA: Diagnosis not present

## 2023-06-01 ENCOUNTER — Other Ambulatory Visit (HOSPITAL_COMMUNITY): Payer: Self-pay

## 2023-06-01 ENCOUNTER — Ambulatory Visit: Payer: Medicare Other | Admitting: Dermatology

## 2023-06-16 ENCOUNTER — Encounter: Payer: Self-pay | Admitting: Family

## 2023-06-16 ENCOUNTER — Telehealth (INDEPENDENT_AMBULATORY_CARE_PROVIDER_SITE_OTHER): Payer: Medicare Other | Admitting: Family

## 2023-06-16 VITALS — BP 100/60 | HR 72 | Ht 65.0 in | Wt 240.4 lb

## 2023-06-16 DIAGNOSIS — F419 Anxiety disorder, unspecified: Secondary | ICD-10-CM

## 2023-06-16 DIAGNOSIS — G2581 Restless legs syndrome: Secondary | ICD-10-CM

## 2023-06-16 DIAGNOSIS — I10 Essential (primary) hypertension: Secondary | ICD-10-CM

## 2023-06-16 DIAGNOSIS — F32A Depression, unspecified: Secondary | ICD-10-CM | POA: Diagnosis not present

## 2023-06-16 MED ORDER — ROPINIROLE HCL 0.5 MG PO TABS: 0.5000 mg | ORAL_TABLET | Freq: Every day | ORAL | 3 refills | Status: DC

## 2023-06-16 MED ORDER — LOSARTAN POTASSIUM-HCTZ 50-12.5 MG PO TABS: 0.5000 | ORAL_TABLET | Freq: Every day | ORAL | Status: DC | PRN

## 2023-06-16 MED ORDER — ROPINIROLE HCL 3 MG PO TABS: 3.0000 mg | ORAL_TABLET | Freq: Every day | ORAL | 3 refills | Status: DC

## 2023-06-16 NOTE — Progress Notes (Signed)
Virtual Visit via Video Note  I connected with Rayford Halsted on 06/16/23 at 11:00 AM EST by a video enabled telemedicine application and verified that I am speaking with the correct person using two identifiers. Location patient: home Location provider: work  Persons participating in the virtual visit: patient, provider  I discussed the limitations of evaluation and management by telemedicine and the availability of in person appointments. The patient expressed understanding and agreed to proceed.  HPI: Follow up restless legs She is compliant with Requip 3mg  at bedtime, increased 6 weeks ago. She then increased 3.5 which has been helpful.    Compliant xanax 0.25mg  BID prn however of lately she has only used 3-4 days per week.    Gabapentin was not effective for her  Ferritin 77 04/2023  She is not taking iron.    Status post left hip total replacement  She has taken hyzaar today as bp was 145/80. BP 100/60 yesterday.   Denies depression. Anxiety has improved.   ROS: See pertinent positives and negatives per HPI.  EXAM:  VITALS per patient if applicable: BP 100/60   Pulse 72   Ht 5\' 5"  (1.651 m)   Wt 240 lb 6.4 oz (109 kg)   SpO2 98%   BMI 40.00 kg/m  BP Readings from Last 3 Encounters:  06/16/23 100/60  05/12/23 (!) 140/67  05/05/23 138/82   Wt Readings from Last 3 Encounters:  06/16/23 240 lb 6.4 oz (109 kg)  05/11/23 240 lb 8 oz (109.1 kg)  05/05/23 243 lb 9.6 oz (110.5 kg)      06/16/2023   10:57 AM 03/14/2023    9:08 AM 03/14/2023    8:51 AM  Depression screen PHQ 2/9  Decreased Interest 0 1 0  Down, Depressed, Hopeless 0 0 0  PHQ - 2 Score 0 1 0  Altered sleeping  3 0  Tired, decreased energy  2 0  Change in appetite  1 0  Feeling bad or failure about yourself   1 0  Trouble concentrating  1 0  Moving slowly or fidgety/restless  0 0  Suicidal thoughts  0 0  PHQ-9 Score  9 0  Difficult doing work/chores  Somewhat difficult Not difficult at all     GENERAL: alert, oriented, appears well and in no acute distress  HEENT: atraumatic, conjunttiva clear, no obvious abnormalities on inspection of external nose and ears  NECK: normal movements of the head and neck  LUNGS: on inspection no signs of respiratory distress, breathing rate appears normal, no obvious gross SOB, gasping or wheezing  CV: no obvious cyanosis  MS: moves all visible extremities without noticeable abnormality  PSYCH/NEURO: pleasant and cooperative, no obvious depression or anxiety, speech and thought processing grossly intact  ASSESSMENT AND PLAN: Restless leg syndrome Assessment & Plan: Chronic, improved.  Ferritin 77.  Depression and anxiety under excellent control . continue requip 3.5mg  at bedtime.   Orders: -     rOPINIRole HCl; Take 1 tablet (3 mg total) by mouth at bedtime.  Dispense: 90 tablet; Refill: 3 -     rOPINIRole HCl; Take 1 tablet (0.5 mg total) by mouth at bedtime.  Dispense: 90 tablet; Refill: 3  Primary hypertension -     Losartan Potassium-HCTZ; Take 0.5 tablets by mouth daily as needed (for BP 140/80).  Anxiety and depression Assessment & Plan: Chronic, excellent control.  Continue escitalopram 10mg , xanax 0.25mg  po prn       -we discussed possible serious and likely  etiologies, options for evaluation and workup, limitations of telemedicine visit vs in person visit, treatment, treatment risks and precautions. Pt prefers to treat via telemedicine empirically rather then risking or undertaking an in person visit at this moment.    I discussed the assessment and treatment plan with the patient. The patient was provided an opportunity to ask questions and all were answered. The patient agreed with the plan and demonstrated an understanding of the instructions.   The patient was advised to call back or seek an in-person evaluation if the symptoms worsen or if the condition fails to improve as anticipated.  Advised if desired AVS can be  mailed or viewed via MyChart if Mychart user.   Rennie Plowman, FNP

## 2023-06-16 NOTE — Patient Instructions (Addendum)
Take HALF tablet of hyzaar if needed for blood pressure > 140/80.  Increase requip 3.5 mg at bedtime.   Happy holidays!

## 2023-06-16 NOTE — Assessment & Plan Note (Signed)
Chronic, excellent control.  Continue escitalopram 10mg , xanax 0.25mg  po prn

## 2023-06-16 NOTE — Assessment & Plan Note (Addendum)
Chronic, improved.  Ferritin 77.  Depression and anxiety under excellent control . continue requip 3.5mg  at bedtime.

## 2023-06-20 ENCOUNTER — Ambulatory Visit: Payer: Medicare Other | Admitting: Dermatology

## 2023-06-23 DIAGNOSIS — Z96642 Presence of left artificial hip joint: Secondary | ICD-10-CM | POA: Diagnosis not present

## 2023-06-27 ENCOUNTER — Encounter: Payer: Self-pay | Admitting: Dermatology

## 2023-06-27 ENCOUNTER — Ambulatory Visit: Payer: Medicare Other | Admitting: Dermatology

## 2023-06-27 DIAGNOSIS — L821 Other seborrheic keratosis: Secondary | ICD-10-CM

## 2023-06-27 DIAGNOSIS — L814 Other melanin hyperpigmentation: Secondary | ICD-10-CM | POA: Diagnosis not present

## 2023-06-27 DIAGNOSIS — D229 Melanocytic nevi, unspecified: Secondary | ICD-10-CM

## 2023-06-27 DIAGNOSIS — Z853 Personal history of malignant neoplasm of breast: Secondary | ICD-10-CM

## 2023-06-27 DIAGNOSIS — W908XXA Exposure to other nonionizing radiation, initial encounter: Secondary | ICD-10-CM | POA: Diagnosis not present

## 2023-06-27 DIAGNOSIS — D1801 Hemangioma of skin and subcutaneous tissue: Secondary | ICD-10-CM

## 2023-06-27 DIAGNOSIS — D1724 Benign lipomatous neoplasm of skin and subcutaneous tissue of left leg: Secondary | ICD-10-CM | POA: Diagnosis not present

## 2023-06-27 DIAGNOSIS — Z1283 Encounter for screening for malignant neoplasm of skin: Secondary | ICD-10-CM

## 2023-06-27 DIAGNOSIS — L578 Other skin changes due to chronic exposure to nonionizing radiation: Secondary | ICD-10-CM

## 2023-06-27 NOTE — Progress Notes (Signed)
   New Patient Visit   Subjective  Amber Barnes is a 72 y.o. female who presents for the following: Skin Cancer Screening and Full Body Skin Exam. No personal Hx of skin cancer or dysplastic nevi.   The patient presents for Total-Body Skin Exam (TBSE) for skin cancer screening and mole check. The patient has spots, moles and lesions to be evaluated, some may be new or changing and the patient may have concern these could be cancer.  No hx of skin cancer. Patient was seen at [facility] and had bx done at right cheek, patient was advised results were benign.   The following portions of the chart were reviewed this encounter and updated as appropriate: medications, allergies, medical history  Review of Systems:  No other skin or systemic complaints except as noted in HPI or Assessment and Plan.  Objective  Well appearing patient in no apparent distress; mood and affect are within normal limits.  A full examination was performed including scalp, head, eyes, ears, nose, lips, neck, chest, axillae, abdomen, back, buttocks, bilateral upper extremities, bilateral lower extremities, hands, feet, fingers, toes, fingernails, and toenails. All findings within normal limits unless otherwise noted below.   Relevant physical exam findings are noted in the Assessment and Plan.    Assessment & Plan   SKIN CANCER SCREENING PERFORMED TODAY.  ACTINIC DAMAGE - Chronic condition, secondary to cumulative UV/sun exposure - diffuse scaly erythematous macules with underlying dyspigmentation - Recommend daily broad spectrum sunscreen SPF 30+ to sun-exposed areas, reapply every 2 hours as needed.  - Staying in the shade or wearing long sleeves, sun glasses (UVA+UVB protection) and wide brim hats (4-inch brim around the entire circumference of the hat) are also recommended for sun protection.  - Call for new or changing lesions.  LENTIGINES, SEBORRHEIC KERATOSES, HEMANGIOMAS - Benign normal skin  lesions - Benign-appearing - Call for any changes  MELANOCYTIC NEVI - Tan-brown and/or pink-flesh-colored symmetric macules and papules - Benign appearing on exam today - Observation - Call clinic for new or changing moles - Recommend daily use of broad spectrum spf 30+ sunscreen to sun-exposed areas.    Lipoma vs angiolipoma  Exam: large subcutaneous movable mass Location: left hip/thigh  Benign-appearing. Exam most consistent with a lipoma. Discussed that a lipoma is a benign fatty growth that can grow over time and sometimes get irritated. Recommend observation if it is not bothersome or changing. Discussed option of ILK injections or surgical excision to remove it if it is growing, symptomatic, or other changes noted. Please call for new or changing lesions so they can be evaluated.  Discussed biopsy to be more certain of diagnosis. Due to size of area will require removal by general surgeon. Patient will send message through MyChart if wants referral.   SEBORRHEIC KERATOSIS - Stuck-on, waxy, tan-brown plaque at suprapubic area, brown waxy papule at inferior left breast.  - Benign-appearing - Discussed benign etiology and prognosis. - Observe - Call for any changes  Hx of breast cancer. Right breast.       Return for TBSE in 1-2 years.  I, Lawson Radar, CMA, am acting as scribe for Elie Goody, MD.   Documentation: I have reviewed the above documentation for accuracy and completeness, and I agree with the above.  Elie Goody, MD

## 2023-06-27 NOTE — Patient Instructions (Signed)
 Recommend daily broad spectrum sunscreen SPF 30+ to sun-exposed areas, reapply every 2 hours as needed. Call for new or changing lesions.  Staying in the shade or wearing long sleeves, sun glasses (UVA+UVB protection) and wide brim hats (4-inch brim around the entire circumference of the hat) are also recommended for sun protection.    Melanoma ABCDEs  Melanoma is the most dangerous type of skin cancer, and is the leading cause of death from skin disease.  You are more likely to develop melanoma if you: Have light-colored skin, light-colored eyes, or red or blond hair Spend a lot of time in the sun Tan regularly, either outdoors or in a tanning bed Have had blistering sunburns, especially during childhood Have a close family member who has had a melanoma Have atypical moles or large birthmarks  Early detection of melanoma is key since treatment is typically straightforward and cure rates are extremely high if we catch it early.   The first sign of melanoma is often a change in a mole or a new dark spot.  The ABCDE system is a way of remembering the signs of melanoma.  A for asymmetry:  The two halves do not match. B for border:  The edges of the growth are irregular. C for color:  A mixture of colors are present instead of an even brown color. D for diameter:  Melanomas are usually (but not always) greater than 6mm - the size of a pencil eraser. E for evolution:  The spot keeps changing in size, shape, and color.  Please check your skin once per month between visits. You can use a small mirror in front and a large mirror behind you to keep an eye on the back side or your body.   If you see any new or changing lesions before your next follow-up, please call to schedule a visit.  Please continue daily skin protection including broad spectrum sunscreen SPF 30+ to sun-exposed areas, reapplying every 2 hours as needed when you're outdoors.   Staying in the shade or wearing long sleeves, sun  glasses (UVA+UVB protection) and wide brim hats (4-inch brim around the entire circumference of the hat) are also recommended for sun protection.     Due to recent changes in healthcare laws, you may see results of your pathology and/or laboratory studies on MyChart before the doctors have had a chance to review them. We understand that in some cases there may be results that are confusing or concerning to you. Please understand that not all results are received at the same time and often the doctors may need to interpret multiple results in order to provide you with the best plan of care or course of treatment. Therefore, we ask that you please give [place] 2 business days to thoroughly review all your results before contacting the office for clarification. Should we see a critical lab result, you will be contacted sooner.   If You Need Anything After Your Visit  If you have any questions or concerns for your doctor, please call our main line at [phone] and press option 4 to reach your doctor's medical assistant. If no one answers, please leave a voicemail as directed and we will return your call as soon as possible. Messages left after 4 pm will be answered the following business day.   You may also send [place] a message via MyChart. We typically respond to MyChart messages within 1-2 business days.  For prescription refills, please ask your pharmacy to contact  our office. Our fax number is [phone].  If you have an urgent issue when the clinic is closed that cannot wait until the next business day, you can page your doctor at the number below.    Please note that while we do our best to be available for urgent issues outside of office hours, we are not available 24/7.   If you have an urgent issue and are unable to reach [place], you may choose to seek medical care at your doctor's office, retail clinic, urgent care center, or emergency room.  If you have a medical emergency, please immediately  call 911 or go to the emergency department.  Pager Numbers  - Dr. Gwen Pounds: [phone]  - Dr. Roseanne Reno: [phone]  - Dr. Katrinka Blazing: [phone]   In the event of inclement weather, please call our main line at [phone] for an update on the status of any delays or closures.  Dermatology Medication Tips: Please keep the boxes that topical medications come in in order to help keep track of the instructions about where and how to use these. Pharmacies typically print the medication instructions only on the boxes and not directly on the medication tubes.   If your medication is too expensive, please contact our office at [phone] option 4 or send [place] a message through MyChart.   We are unable to tell what your co-pay for medications will be in advance as this is different depending on your insurance coverage. However, we may be able to find a substitute medication at lower cost or fill out paperwork to get insurance to cover a needed medication.   If a prior authorization is required to get your medication covered by your insurance company, please allow [place] 1-2 business days to complete this process.  Drug prices often vary depending on where the prescription is filled and some pharmacies may offer cheaper prices.  The website www.goodrx.com contains coupons for medications through different pharmacies. The prices here do not account for what the cost may be with help from insurance (it may be cheaper with your insurance), but the website can give you the price if you did not use any insurance.  - You can print the associated coupon and take it with your prescription to the pharmacy.  - You may also stop by our office during regular business hours and pick up a GoodRx coupon card.  - If you need your prescription sent electronically to a different pharmacy, notify our office through [facility] or by phone at [phone] option 4.     Si Usted Necesita Algo Despus de Su  Visita  Tambin puede enviarnos un mensaje a travs de Clinical cytogeneticist. Por lo general respondemos a los mensajes de MyChart en el transcurso de 1 a 2 das hbiles.  Para renovar recetas, por favor pida a su farmacia que se ponga en contacto con nuestra oficina. Annie Sable de fax es [place] [phone].  Si tiene un asunto urgente cuando la clnica est cerrada y que no puede esperar hasta el siguiente da hbil, puede llamar/localizar a su doctor(a) al nmero que aparece a continuacin.   Por favor, tenga en cuenta que aunque hacemos todo lo posible para estar disponibles para asuntos urgentes fuera del horario de [place], no estamos disponibles las 24 horas del da, los 7 [address]  [address] de la [place].   Si tiene un problema urgente y no puede comunicarse con nosotros, puede optar por buscar atencin mdica  en el consultorio de su doctor(a), en una clnica  privada, en un centro de atencin urgente o en una sala de emergencias.  Si tiene Engineer, drilling, por favor llame inmediatamente al 911 o vaya a la sala de emergencias.  Nmeros de bper  - Dr. Gwen Pounds: [phone]  - Dra. Roseanne Reno: [phone]  - Dr. Katrinka Blazing: [phone]   En caso de inclemencias del tiempo, por favor llame a Lacy Duverney principal al [phone] para una actualizacin sobre el [place] de cualquier retraso o cierre.  Consejos para la medicacin en dermatologa: Por favor, guarde las cajas en las que vienen los medicamentos de uso tpico para ayudarle a seguir las instrucciones sobre dnde y cmo usarlos. Las farmacias generalmente imprimen las instrucciones del medicamento slo en las cajas y no directamente en los tubos del [place].   Si su medicamento es muy caro, por favor, pngase en contacto con Rolm Gala llamando al [phone] y presione la opcin 4 o envenos un mensaje a travs de Clinical cytogeneticist.   No podemos decirle cul ser su copago por los medicamentos por adelantado ya que esto es diferente dependiendo de  la cobertura de su seguro. Sin embargo, es posible que podamos encontrar un medicamento sustituto a Audiological scientist un formulario para que el seguro cubra el medicamento que se considera necesario.   Si se requiere una autorizacin previa para que su compaa de seguros [place] su medicamento, por favor permtanos de 1 a 2 das hbiles para completar [address]  Los precios de los medicamentos varan con frecuencia dependiendo del Environmental consultant de dnde se surte la receta y alguna farmacias pueden ofrecer precios ms baratos.  El sitio web www.goodrx.com tiene cupones para medicamentos de Health and safety inspector. Los precios aqu no tienen en cuenta lo que podra costar con la ayuda del seguro (puede ser ms barato con su seguro), pero el sitio web puede darle el precio si no utiliz Tourist information centre manager.  - Puede imprimir el cupn correspondiente y llevarlo con su receta a la farmacia.  - Tambin puede pasar por nuestra oficina durante el horario de atencin regular y Education officer, museum una tarjeta de cupones de GoodRx.  - Si necesita que su receta se enve electrnicamente a una farmacia diferente, informe a nuestra oficina a travs de MyChart de [place] o por telfono llamando al [phone] y presione la opcin 4.

## 2023-06-28 ENCOUNTER — Telehealth: Payer: Self-pay

## 2023-06-28 ENCOUNTER — Inpatient Hospital Stay: Payer: Medicare Other | Attending: Oncology

## 2023-06-28 DIAGNOSIS — Z853 Personal history of malignant neoplasm of breast: Secondary | ICD-10-CM | POA: Insufficient documentation

## 2023-06-28 DIAGNOSIS — Z452 Encounter for adjustment and management of vascular access device: Secondary | ICD-10-CM | POA: Diagnosis not present

## 2023-06-28 DIAGNOSIS — Z95828 Presence of other vascular implants and grafts: Secondary | ICD-10-CM

## 2023-06-28 MED ORDER — HEPARIN SOD (PORK) LOCK FLUSH 100 UNIT/ML IV SOLN
500.0000 [IU] | Freq: Once | INTRAVENOUS | Status: AC
  Administered 2023-06-28: 500 [IU] via INTRAVENOUS
  Filled 2023-06-28: qty 5

## 2023-06-28 MED ORDER — SODIUM CHLORIDE 0.9% FLUSH
10.0000 mL | Freq: Once | INTRAVENOUS | Status: AC
  Administered 2023-06-28: 10 mL via INTRAVENOUS
  Filled 2023-06-28: qty 10

## 2023-06-28 NOTE — Telephone Encounter (Signed)
Patient called office today and would like referral for general surgeon discussed at office visit yesterday. aw

## 2023-06-29 ENCOUNTER — Other Ambulatory Visit: Payer: Self-pay

## 2023-06-29 DIAGNOSIS — D1724 Benign lipomatous neoplasm of skin and subcutaneous tissue of left leg: Secondary | ICD-10-CM

## 2023-06-29 NOTE — Progress Notes (Signed)
Referral sent to general surgery per patient request.

## 2023-07-03 ENCOUNTER — Other Ambulatory Visit: Payer: Self-pay | Admitting: Family

## 2023-07-03 DIAGNOSIS — F419 Anxiety disorder, unspecified: Secondary | ICD-10-CM

## 2023-07-14 ENCOUNTER — Other Ambulatory Visit: Payer: Self-pay | Admitting: Family

## 2023-07-14 ENCOUNTER — Telehealth: Payer: Self-pay | Admitting: Family

## 2023-07-14 ENCOUNTER — Ambulatory Visit (INDEPENDENT_AMBULATORY_CARE_PROVIDER_SITE_OTHER): Payer: Medicare Other | Admitting: Family

## 2023-07-14 VITALS — BP 136/82 | HR 79 | Temp 97.6°F | Ht 61.0 in | Wt 245.2 lb

## 2023-07-14 DIAGNOSIS — I10 Essential (primary) hypertension: Secondary | ICD-10-CM | POA: Diagnosis not present

## 2023-07-14 DIAGNOSIS — F419 Anxiety disorder, unspecified: Secondary | ICD-10-CM

## 2023-07-14 DIAGNOSIS — F32A Depression, unspecified: Secondary | ICD-10-CM | POA: Diagnosis not present

## 2023-07-14 MED ORDER — ESCITALOPRAM OXALATE 10 MG PO TABS: 10.0000 mg | ORAL_TABLET | Freq: Every day | ORAL | 3 refills | Status: DC

## 2023-07-14 MED ORDER — BUSPIRONE HCL 5 MG PO TABS
5.0000 mg | ORAL_TABLET | Freq: Two times a day (BID) | ORAL | Status: DC
Start: 2023-07-14 — End: 2023-09-11

## 2023-07-14 MED ORDER — LOSARTAN POTASSIUM-HCTZ 50-12.5 MG PO TABS: 0.5000 | ORAL_TABLET | Freq: Every day | ORAL | 1 refills | Status: DC | PRN

## 2023-07-14 NOTE — Assessment & Plan Note (Signed)
 Chronic, excellent control.  Continue escitalopram 10mg , xanax 0.25mg  po prn , buspar 10mg  bid.

## 2023-07-14 NOTE — Telephone Encounter (Signed)
 Noted.

## 2023-07-14 NOTE — Assessment & Plan Note (Signed)
 Chronic, stable today.  Provided refill of losartan  hydrochlorothiazide  half tablet and advised to take her blood pressure greater than 140/80.  Compliant with Lasix  20 mg daily.  She will take an additional 20 mg dose of Lasix  as needed.  She takes potassium chloride  20 meq  daily and an additional dose if she takes an additional lasix  20mg  dose.

## 2023-07-14 NOTE — Telephone Encounter (Signed)
 Pt has made her appointment for to get her lab work done on Tuesday 07/18/23.

## 2023-07-14 NOTE — Progress Notes (Signed)
 Assessment & Plan:  Anxiety and depression Assessment & Plan: Chronic, excellent control.  Continue escitalopram  10mg , xanax  0.25mg  po prn , buspar  10mg  bid.   Orders: -     Escitalopram  Oxalate; Take 1 tablet (10 mg total) by mouth daily.  Dispense: 90 tablet; Refill: 3  Anxiety -     busPIRone  HCl; Take 1 tablet (5 mg total) by mouth 2 (two) times daily.  Primary hypertension Assessment & Plan: Chronic, stable today.  Provided refill of losartan  hydrochlorothiazide  half tablet and advised to take her blood pressure greater than 140/80.  Compliant with Lasix  20 mg daily.  She will take an additional 20 mg dose of Lasix  as needed.  She takes potassium chloride  20 meq  daily and an additional dose if she takes an additional lasix  20mg  dose.   Orders: -     Losartan  Potassium-HCTZ; Take 0.5 tablets by mouth daily as needed (for BP 140/80).  Dispense: 30 tablet; Refill: 1 -     Basic metabolic panel; Future     Return precautions given.   Risks, benefits, and alternatives of the medications and treatment plan prescribed today were discussed, and patient expressed understanding.   Education regarding symptom management and diagnosis given to patient on AVS either electronically or printed.  Return in about 6 months (around 01/11/2024).  [name], FNP  Subjective:    Patient ID: Amber Barnes, female    DOB: 12/25/1950, 73 y.o.   MRN: [id number]  CC: Amber Barnes is a 73 y.o. female who presents today for follow up.   HPI: Medication follow up Feels well today.  No new complaints.     She had follow-up with dermatology for suspected left thigh lipoma.  Pending consult with general surgeon.  She also has upcoming appointment with orthopedics to discuss left knee replacement.    She feels Lexapro  and BuSpar  are working well.  Rare use of Xanax   Allergies: Hydrocodone , Prednisone , Amiodarone , Hydrocodone -acetaminophen , Tapentadol, Adhesive [tape], Latex, Oxycodone ,  Tramadol , and Wound dressing adhesive Current Outpatient Medications on File Prior to Visit  Medication Sig Dispense Refill   acetaminophen  (TYLENOL ) 500 MG tablet Take 2 tablets (1,000 mg total) by mouth every 8 (eight) hours. 30 tablet 0   ALPRAZolam  (XANAX ) 0.25 MG tablet TAKE 1 TABLET BY MOUTH TWICE A DAY AS NEEDED FOR ANXIETY (Patient taking differently: Take 0.25 mg by mouth at bedtime as needed for anxiety or sleep.) 60 tablet 2   bisoprolol  (ZEBETA ) 10 MG tablet Take 1 tablet (10 mg total) by mouth 2 (two) times daily. 180 tablet 3   Cholecalciferol  (VITAMIN D3) 5000 units CAPS Take 5,000 Units by mouth daily.     ELIQUIS  5 MG TABS tablet TAKE 1 TABLET BY MOUTH TWICE A DAY 180 tablet 1   furosemide  (LASIX ) 20 MG tablet Take 1 tablet (20 mg total) by mouth daily. May take additional 20mg  PO prn for leg swelling 180 tablet 3   Multiple Vitamin (MULTIVITAMIN) tablet Take 1 tablet by mouth daily.     nystatin  cream (MYCOSTATIN ) Apply 1 application. topically 2 (two) times daily. Prn abdomen (Patient taking differently: Apply 1 application  topically 2 (two) times daily as needed for dry skin. Prn abdomen) 60 g 5   potassium chloride  SA (KLOR-CON  M) 20 MEQ tablet Take 1 tablet (20 mEq total) by mouth daily. (Patient taking differently: Take 20 mEq by mouth every morning. And if she takes additional Lasix  she will take another K+) 90 tablet 3  ranolazine  (RANEXA ) 500 MG 12 hr tablet TAKE 1 TABLET BY MOUTH TWICE A DAY 180 tablet 2   rOPINIRole  (REQUIP ) 0.5 MG tablet Take 1 tablet (0.5 mg total) by mouth at bedtime. 90 tablet 3   rOPINIRole  (REQUIP ) 3 MG tablet Take 1 tablet (3 mg total) by mouth at bedtime. 90 tablet 3   rosuvastatin  (CRESTOR ) 40 MG tablet TAKE 1 TABLET BY MOUTH EVERY DAY 90 tablet 3   [DISCONTINUED] prochlorperazine  (COMPAZINE ) 10 MG tablet Take 1 tablet (10 mg total) by mouth every 6 (six) hours as needed (Nausea or vomiting). 30 tablet 1   No current facility-administered  medications on file prior to visit.    Review of Systems  Constitutional:  Negative for chills and fever.  Respiratory:  Negative for cough.   Cardiovascular:  Negative for chest pain and palpitations.  Gastrointestinal:  Negative for nausea and vomiting.      Objective:    BP 136/82   Pulse 79   Temp 97.6 F (36.4 C) (Oral)   Ht 5' 1 (1.549 m)   Wt 245 lb 3.2 oz (111.2 kg)   SpO2 98%   BMI 46.33 kg/m  BP Readings from Last 3 Encounters:  07/14/23 136/82  06/16/23 100/60  05/12/23 (!) 140/67   Wt Readings from Last 3 Encounters:  07/14/23 245 lb 3.2 oz (111.2 kg)  06/16/23 240 lb 6.4 oz (109 kg)  05/11/23 240 lb 8 oz (109.1 kg)    Physical Exam Vitals reviewed.  Constitutional:      Appearance: She is well-developed.  Eyes:     Conjunctiva/sclera: Conjunctivae normal.  Cardiovascular:     Rate and Rhythm: Normal rate and regular rhythm.     Pulses: Normal pulses.     Heart sounds: Normal heart sounds.  Pulmonary:     Effort: Pulmonary effort is normal.     Breath sounds: Normal breath sounds. No wheezing, rhonchi or rales.  Skin:    General: Skin is warm and dry.  Neurological:     Mental Status: She is alert.  Psychiatric:        Speech: Speech normal.        Behavior: Behavior normal.        Thought Content: Thought content normal.

## 2023-07-17 NOTE — Progress Notes (Signed)
 Patient ID: Amber Barnes, female   DOB: 1951/06/24, 73 y.o.   MRN: [id number]  Chief Complaint: Left hip mass for years  History of Present Illness   Left Total Hip Arthroplasty 05/11/2023 She has recently gone through so much including cancer therapy and weight loss.  She is currently participating in chair yoga, and has lost a fair amount of weight, along with this the mass has become more noticeable/pronounced due to the adjacent weight loss most likely.  Has become more symptomatic during her group sessions performing chair yoga.  Previous ultrasound had been obtained, not defining any particular lesion in the area.  So that this is likely a lipomatous mass very consistent with the normal lipomatous tissue around it.  It is significantly more pronounced than previously appreciated on her prior visit.  Previously seen 12/2021 note below: Amber Barnes is a 74 y.o. female with a reportedly enlarging left hip mass over the last 2 to 3 months.  She reports her pain is a 10 out of 10.  She has had no drainage from the area.  She denies nausea, vomiting, fevers and chills.  She reports it seems to be exacerbated at night when trying to sleep because she is awake and tears.  She also reports direct pressure exacerbates the pain.  And various other activities like exercise in the pool, walking etc.  She has utilized medications of Xanax  and Tylenol  to help her with pain, she also reports massaging the area to help with pain as well.  Otherwise nothing works to help her with her pain.  Past Medical History Past Medical History:  Diagnosis Date   Achilles tendinitis    Acute medial meniscal tear    Allergy    Anemia    Anxiety    a.) on BZO PRN (alprazolam )   Aortic atherosclerosis (HCC)    Ascending aorta dilatation (HCC) 10/20/2022   a.) TTE 10/20/2022: asc Ao 39 mm   Atrial fibrillation/flutter (HCC) 07/2016   a.) Dx'd 07/2016; b.) CHA2DS2-VASc = 5 (age, sex, HFmrEF, HTN, vasc disease  history) as of 05/09/2023; c.) s/p DCCV 09/07/2016 (150 J x 2 --> A.flutter; 200 J x 1 --> NSR); d.) s/p DCCV 10/19/2016 (150 J x 1 --> NSR); e.) brought in for DCCV 12/2016; cancelled d/t NSR; f.) s/p PVI ablation 12/12/2022; g.) cardiac rate/rhythm maintained on oral bisoprolol ; chronically anticoagulated using apixaban    CAD (coronary artery disease)    a.) cCTA 12/08/2022: Ca2+ = 31.6 (58th %'ile for age/sex/race match control)   Chemotherapy induced nausea and vomiting 05/18/2021   Chronic heart failure with mid-range ejection fraction (HFmrEF) (HCC)    a.) TTE 03/08/21: EF 60-65, mild LVH, G1DD, mild LAE, triv MR; b.) TTE 07/29/21: EF 60-65, G1DD, mild LAE, triv MR; c.) TTE 11/26/21: EF 55-60, G1DD, mild LAE, mild MR; d.) TTE 02/25/22: EF 60-65, G1DD, mild LAE, mild MR; e.) TTE 05/31/22: EF 55-60, mild LVH, G2DD, mild-mod MR; f.) TTE 10/20/22: EF 45-50, mild LAE, mod red RVSF, mild-mod TR, Asc Ao 39; g.) TTE 12/13/22: EF 50-55, mild LVH, mild MAC   CKD (chronic kidney disease), stage II    Colon polyp    Complication of anesthesia    a.) emergence delirium; b.) hypoxia after hip replacement   COPD (chronic obstructive pulmonary disease) (HCC)    DDD (degenerative disc disease), lumbar    a.) s/p fusion   Depression    Endometriosis    Fibrocystic breast disease    GERD (gastroesophageal reflux  disease)    History of stress test    a.) MV 07/29/2016: EF 55-65%. Small, mild defect  in mid anteroseptal, apical septal, and apical locations. No ischemia-->low risk; b.) MV 10/2022: mod sized partially rev apical ant, apical septal, and apical defect. EF 52%. No signific cor Ca2+; c.) cCTA 12/08/2022: Ca2+ = 31.6 (58th %'ile).   History of tobacco use    Hyperlipidemia    Hypertension    Hypothyroidism    a.) s/p partial thyroidectomy for nodule; pathology benign   Insomnia    Lipoma    Malignant neoplasm of upper-outer quadrant of right female breast (HCC) 03/03/2021   a.) stage 1A invasive mammary  carcinoma (cT1b or T1c N0); ER (-), PR (+), HER2/neu (+); did not tolerate adjuvant chemotherapy (paclitacel + trastuzumab ) despite paclitaxel  dose reduction; s/p adjuvant XRT; cinished maintenance trastuzumab  07/27/2022; no adjuvant endocrine therapy as no clear benefit   Migraine    Nephrolithiasis    On apixaban  therapy    OSA (obstructive sleep apnea)    a.) non-compliant with nocturnal PAP therapy   Osteoarthritis    Osteopenia    Peptic ulcer disease    a.) H. pylori   Pericarditis 12/12/2022   a.) following A.fib ablation; resolved   Pneumonia    Port-A-Cath in place    RLS (restless legs syndrome)    a.) on ropinirole    Vitamin D  deficiency       Past Surgical History:  Procedure Laterality Date   ABDOMINAL HYSTERECTOMY     ovaries left   ACHILLES TENDON SURGERY     2012,01/2017   ATRIAL FIBRILLATION ABLATION N/A 12/12/2022   Procedure: ATRIAL FIBRILLATION ABLATION;  Surgeon: Cindie Ole DASEN, MD;  Location: [facility];  Service: Cardiovascular;  Laterality: N/A;   BREAST BIOPSY Right 01/15/2021   Affirm biopsy-X clip-INVASIVE MAMMARY CARCINOMA   BREAST BIOPSY Right 01/15/2021   u/s bx-venus clip INVASIVE MAMMARY CARCINOMA   BREAST BIOPSY     BREAST BIOPSY Right 01/04/2023   u/s bx ribbon clip path pending   BREAST BIOPSY Right 01/04/2023   US  RT BREAST BX W LOC DEV 1ST LESION IMG BX SPEC US  GUIDE 01/04/2023 [facility]   BREAST LUMPECTOMY,RADIO FREQ LOCALIZER,AXILLARY SENTINEL LYMPH NODE BIOPSY Right 02/17/2021   Procedure: BREAST LUMPECTOMY,RADIO FREQ LOCALIZER,AXILLARY SENTINEL LYMPH NODE BIOPSY;  Surgeon: Lane Shope, MD;  Location: [facility];  Service: General;  Laterality: Right;   CARDIOVERSION N/A 09/07/2016   Procedure: CARDIOVERSION;  Surgeon: Evalene JINNY Lunger, MD;  Location: [facility];  Service: Cardiovascular;  Laterality: N/A;   CARDIOVERSION N/A 10/19/2016   Procedure: CARDIOVERSION;  Surgeon: Evalene JINNY Lunger, MD;  Location: [facility];  Service: Cardiovascular;  Laterality: N/A;   CARDIOVERSION N/A 12/14/2016   Procedure: CARDIOVERSION;  Surgeon: Lunger Evalene JINNY, MD;  Location: [facility];  Service: Cardiovascular;  Laterality: N/A;   COLONOSCOPY     COLONOSCOPY WITH PROPOFOL  N/A 05/13/2020   Procedure: COLONOSCOPY WITH PROPOFOL ;  Surgeon: Toledo, Ladell POUR, MD;  Location: [facility];  Service: Gastroenterology;  Laterality: N/A;  ELIQUIS    CYSTOSCOPY WITH STENT PLACEMENT Bilateral 07/12/2021   Procedure: CYSTOSCOPY WITH STENT PLACEMENT;  Surgeon: Francisca Redell BROCKS, MD;  Location: [facility];  Service: Urology;  Laterality: Bilateral;   CYSTOSCOPY/URETEROSCOPY/HOLMIUM LASER/STENT PLACEMENT Bilateral 07/30/2021   Procedure: CYSTOSCOPY/URETEROSCOPY/HOLMIUM LASER/BILATERAL STENT REMOVAL;  Surgeon: Francisca Redell BROCKS, MD;  Location: [facility];  Service: Urology;  Laterality: Bilateral;   ESOPHAGOGASTRODUODENOSCOPY     ESOPHAGOGASTRODUODENOSCOPY (EGD) WITH PROPOFOL  N/A 05/13/2020  Procedure: ESOPHAGOGASTRODUODENOSCOPY (EGD) WITH PROPOFOL ;  Surgeon: Toledo, Ladell POUR, MD;  Location: [facility];  Service: Gastroenterology;  Laterality: N/A;   FOOT SURGERY Left    tendon   LUMBAR FUSION     PORTA CATH INSERTION N/A 03/23/2021   Procedure: PORTA CATH INSERTION;  Surgeon: Jama Cordella MATSU, MD;  Location: [facility];  Service: Cardiovascular;  Laterality: N/A;   PORTACATH PLACEMENT N/A 03/23/2021   Procedure: ATTEMPTED INSERTION PORT-A-CATH;  Surgeon: Lane Shope, MD;  Location: [facility];  Service: General;  Laterality: N/A;   THYROIDECTOMY  1990   half thyroid  lobectomy secondary to a benign nodule   TOTAL HIP ARTHROPLASTY Right 08/21/2018   Procedure: TOTAL HIP ARTHROPLASTY ANTERIOR APPROACH-RIGHT CELL SAVER REQUESTED;  Surgeon: Kathlynn Sharper, MD;  Location: [facility];  Service: Orthopedics;  Laterality: Right;   TOTAL HIP ARTHROPLASTY Left 05/11/2023   Procedure: TOTAL HIP ARTHROPLASTY ANTERIOR APPROACH;   Surgeon: Lorelle Hussar, MD;  Location: [facility];  Service: Orthopedics;  Laterality: Left;    Allergies  Allergen Reactions   Hydrocodone     Prednisone  Other (See Comments)    Agitation, hyperactivity, polyphagia   Amiodarone  Nausea And Vomiting and Other (See Comments)   Hydrocodone -Acetaminophen  Other (See Comments)    Hallucinations and combative   Tapentadol Itching   Adhesive [Tape] Itching and Rash    Prefers paper tape   Latex Itching and Rash    use paper tap   Oxycodone  Itching    Can take it with benadryl    Tramadol  Itching    Can take it with benadryl    Wound Dressing Adhesive     Current Outpatient Medications  Medication Sig Dispense Refill   acetaminophen  (TYLENOL ) 500 MG tablet Take 2 tablets (1,000 mg total) by mouth every 8 (eight) hours. 30 tablet 0   ALPRAZolam  (XANAX ) 0.25 MG tablet TAKE 1 TABLET BY MOUTH TWICE A DAY AS NEEDED FOR ANXIETY (Patient taking differently: Take 0.25 mg by mouth at bedtime as needed for anxiety or sleep.) 60 tablet 2   bisoprolol  (ZEBETA ) 10 MG tablet Take 1 tablet (10 mg total) by mouth 2 (two) times daily. 180 tablet 3   busPIRone  (BUSPAR ) 5 MG tablet Take 1 tablet (5 mg total) by mouth 2 (two) times daily.     Cholecalciferol  (VITAMIN D3) 5000 units CAPS Take 5,000 Units by mouth daily.     ELIQUIS  5 MG TABS tablet TAKE 1 TABLET BY MOUTH TWICE A DAY 180 tablet 1   escitalopram  (LEXAPRO ) 10 MG tablet Take 1 tablet (10 mg total) by mouth daily. 90 tablet 3   furosemide  (LASIX ) 20 MG tablet Take 1 tablet (20 mg total) by mouth daily. May take additional 20mg  PO prn for leg swelling 180 tablet 3   losartan -hydrochlorothiazide  (HYZAAR) 50-12.5 MG tablet Take 0.5 tablets by mouth daily as needed (for BP 140/80). 30 tablet 1   Multiple Vitamin (MULTIVITAMIN) tablet Take 1 tablet by mouth daily.     nystatin  cream (MYCOSTATIN ) Apply 1 application. topically 2 (two) times daily. Prn abdomen (Patient taking differently: Apply 1 application   topically 2 (two) times daily as needed for dry skin. Prn abdomen) 60 g 5   potassium chloride  SA (KLOR-CON  M) 20 MEQ tablet Take 1 tablet (20 mEq total) by mouth daily. (Patient taking differently: Take 20 mEq by mouth every morning. And if she takes additional Lasix  she will take another K+) 90 tablet 3   ranolazine  (RANEXA ) 500 MG 12 hr tablet TAKE 1 TABLET  BY MOUTH TWICE A DAY 180 tablet 2   rOPINIRole  (REQUIP ) 0.5 MG tablet Take 1 tablet (0.5 mg total) by mouth at bedtime. 90 tablet 3   rOPINIRole  (REQUIP ) 3 MG tablet Take 1 tablet (3 mg total) by mouth at bedtime. 90 tablet 3   rosuvastatin  (CRESTOR ) 40 MG tablet TAKE 1 TABLET BY MOUTH EVERY DAY 90 tablet 3   No current facility-administered medications for this visit.    Family History Family History  Problem Relation Age of Onset   Cancer Mother        leukemia   Cancer Father 19       colon   Heart disease Father    Heart attack Father 61       died from MI   Hyperlipidemia Father    Hypertension Father    Cancer Sister 45       ovarian   Depression Sister    Cancer Brother        CLL   Breast cancer Paternal Aunt        33's   Migraines Neg Hx    Thyroid  cancer Neg Hx       Social History Social History   Tobacco Use   Smoking status: Former    Current packs/day: 0.00    Average packs/day: 0.3 packs/day for 10.0 years (2.5 ttl pk-yrs)    Types: Cigarettes    Start date: 10    Quit date: 2005    Years since quitting: 20.0    Passive exposure: Past   Smokeless tobacco: Never   Tobacco comments:    smoked for 10 years, 1 pack every 2-3 days  Vaping Use   Vaping status: Never Used  Substance Use Topics   Alcohol use: Yes    Alcohol/week: 1.0 standard drink of alcohol    Types: 1 Glasses of wine per week    Comment: margarita qd   Drug use: Yes    Types: Marijuana       Physical Exam Blood pressure (!) 153/89, pulse (!) 52, temperature 98 F (36.7 C), height 5' 5 (1.651 m), weight 244 lb (110.7  kg), SpO2 96%. Last Weight  Most recent update: 07/18/2023 10:48 AM    Weight  110.7 kg (244 lb)              CONSTITUTIONAL: Well developed, and nourished, appropriately responsive and aware without distress.   EYES: Sclera non-icteric.   EARS, NOSE, MOUTH AND THROAT:  The oropharynx is clear. Oral mucosa is pink and moist.   Hearing is intact to voice.  NECK: Trachea is midline, and there is no jugular venous distension.  LYMPH NODES:  Lymph nodes in the neck are not enlarged. RESPIRATORY:  Normal respiratory effort without pathologic use of accessory muscles. CARDIOVASCULAR: Heart is regular in rate.  GI: The abdomen is  soft, nontender, and nondistended.  MUSCULOSKELETAL:  Symmetrical muscle tone appreciated in all four extremities.    SKIN: Skin turgor is normal. No pathologic skin lesions appreciated.  There is now a prominent isolated/distinct mass in the left hip area, readily distinguishable from the adjacent soft tissue, and clearly distinct compared to the right hip area.  I believe this is what she had appreciated in the past, and due to her health care efforts has become more observable currently.  Estimated size is well over 15 to 20 cm.  I hope it is discrete and not multilobular, though it is hard to be completely certain of  this. NEUROLOGIC:  Motor and sensation appear grossly normal.  Cranial nerves are grossly without defect. PSYCH:  Alert and oriented to person, place and time. Affect is appropriate for situation.  Data Reviewed I have personally reviewed what is currently available of the patient's imaging, recent labs and medical records.   Labs:     Latest Ref Rng & Units 05/12/2023    5:17 AM 02/14/2023   10:18 AM 12/14/2022    4:49 AM  CBC  WBC 4.0 - 10.5 K/uL 13.5  4.9  7.3   Hemoglobin 12.0 - 15.0 g/dL 88.2  86.5  88.9   Hematocrit 36.0 - 46.0 % 33.6  40.0  32.7   Platelets 150 - 400 K/uL 130  153  PLATELET CLUMPS NOTED ON SMEAR, UNABLE TO ESTIMATE        Latest Ref Rng & Units 05/12/2023    5:17 AM 05/05/2023   10:03 AM 04/28/2023   10:50 AM  CMP  Glucose 70 - 99 mg/dL 873  86  84   BUN 8 - 23 mg/dL 24  16  17    Creatinine 0.44 - 1.00 mg/dL 8.85  8.99  9.09   Sodium 135 - 145 mmol/L 134  140  139   Potassium 3.5 - 5.1 mmol/L 4.8  4.5  3.8   Chloride 98 - 111 mmol/L 100  102  101   CO2 22 - 32 mmol/L 26  28  29    Calcium  8.9 - 10.3 mg/dL 7.8  9.0  8.9   Total Protein 6.5 - 8.1 g/dL   7.5   Total Bilirubin 0.3 - 1.2 mg/dL   1.1   Alkaline Phos 38 - 126 U/L   85   AST 15 - 41 U/L   20   ALT 0 - 44 U/L   16       Imaging:  Within last 24 hrs: No results found.  Assessment    Symptomatic large lipomatous mass left lateral thigh/hip area. Patient Active Problem List   Diagnosis Date Noted   S/P total left hip arthroplasty 05/11/2023   Breast mass, right 12/13/2022   Myocardial injury 12/13/2022   Peptic ulcer 12/13/2022   Obesity, Class III, BMI 40-49.9 (morbid obesity) (HCC) 12/13/2022   PAF (paroxysmal atrial fibrillation) (HCC) 12/13/2022   Pericarditis 12/13/2022   Elevated TSH 09/12/2022   Basal cell carcinoma 08/10/2022   Hypocalcemia 03/23/2022   Primary localized osteoarthritis of pelvic region and thigh 03/02/2022   Displacement of lumbar intervertebral disc without myelopathy 03/02/2022   Left hip pain 01/05/2022   Dizziness 12/10/2021   Chronic pain of left knee 11/16/2021   Leg swelling 10/27/2021   Encounter for monoclonal antibody treatment for malignancy 09/29/2021   Ureterovesical junction (UVJ) obstruction 07/12/2021   Acute pyelonephritis 07/12/2021   CKD (chronic kidney disease), stage II 07/12/2021   HLD (hyperlipidemia) 07/12/2021   Chronic diastolic CHF (congestive heart failure) (HCC) 07/12/2021   Hydroureteronephrosis 07/12/2021   Pyelonephritis 07/02/2021   Drug-induced neutropenia (HCC) 06/23/2021   CKD (chronic kidney disease) 06/21/2021   Chemotherapy induced diarrhea 06/09/2021   Anemia  due to antineoplastic chemotherapy 05/18/2021   Chemotherapy induced nausea and vomiting 05/18/2021   COVID-19 04/30/2021   Neutropenia (HCC)    Anxiety    Intractable vomiting    Diarrhea    Neutropenic fever (HCC) 04/05/2021   Encounter for antineoplastic chemotherapy 03/24/2021   Neuropathy 03/24/2021   Port-A-Cath in place 03/24/2021   HER2-positive carcinoma of breast (HCC) 01/26/2021  Goals of care, counseling/discussion 01/26/2021   Malignant neoplasm of upper-outer quadrant of right female breast (HCC) 01/19/2021   Left arm pain 12/02/2020   Soft tissue mass 12/02/2020   Candidal intertrigo 09/09/2020   Urinary frequency 09/09/2020   Night sweat 03/31/2020   Family history of CLL (chronic lymphoid leukemia) 03/21/2020   Nasal drainage 12/18/2019   Sinus congestion 12/18/2019   Nausea 10/30/2019   Major depression in remission (HCC) 08/27/2018   Primary osteoarthritis involving multiple joints 08/27/2018   Atrial fibrillation (HCC) 08/27/2018   COPD (chronic obstructive pulmonary disease) (HCC) 08/27/2018   BMI 50.0-59.9, adult (HCC) 07/16/2018   Obesity (BMI 35.0-39.9 without comorbidity) 05/31/2018   Bradycardia 05/22/2017   Restless leg syndrome 04/13/2017   OSA (obstructive sleep apnea) 01/09/2017   A-fib (HCC) 11/28/2016   Hypokalemia 07/28/2016   Chest pain    Atrial fibrillation with RVR (HCC) 07/27/2016   Fatty liver disease, nonalcoholic 06/30/2016   Vertigo 88/70/7982   Family history of ovarian cancer 06/08/2016   GERD (gastroesophageal reflux disease) 05/05/2016   Obesity 11/11/2014   HTN (hypertension) 11/11/2014   Anxiety and depression 11/11/2014   Weight loss 11/11/2014    Plan    Although she has had a previous ultrasound which did not distinguish the mass, I believe that an MRI may be helpful at reassuring that this is not a sarcomatous process.  We discussed the risk of a large incision to excise the area, looking at minimally invasive  options I suggested consideration of possible liposuction to treat a benign fatty mass and hopefully diminish the risk of wound issues. She would like to explore the option of discussing with plastic surgery, not for cosmetic sake but for perceived diminished risk with wound breakdown in this area. If after the MRI we lack confidence that this is a benign process, we may proceed with a wide local excision.  Will consult plastics regarding liposuction of the symptomatic lipomatous mass. Face-to-face time spent with the patient and accompanying care providers(if present) was 30 minutes, with more than 50% of the time spent counseling, educating, and coordinating care of the patient.    These notes generated with voice recognition software. I apologize for typographical errors.  Honor Leghorn M.D., FACS 07/18/2023, 12:33 PM

## 2023-07-18 ENCOUNTER — Other Ambulatory Visit: Payer: Medicare Other

## 2023-07-18 ENCOUNTER — Ambulatory Visit: Payer: Medicare Other | Admitting: Surgery

## 2023-07-18 ENCOUNTER — Encounter: Payer: Self-pay | Admitting: Surgery

## 2023-07-18 VITALS — BP 153/89 | HR 52 | Temp 98.0°F | Ht 65.0 in | Wt 244.0 lb

## 2023-07-18 DIAGNOSIS — M25552 Pain in left hip: Secondary | ICD-10-CM

## 2023-07-18 DIAGNOSIS — D1724 Benign lipomatous neoplasm of skin and subcutaneous tissue of left leg: Secondary | ICD-10-CM | POA: Diagnosis not present

## 2023-07-18 DIAGNOSIS — R2242 Localized swelling, mass and lump, left lower limb: Secondary | ICD-10-CM | POA: Insufficient documentation

## 2023-07-18 NOTE — Patient Instructions (Addendum)
 We will refer you to Plastic Surgery for a consultation on this area to see if they may be more suitable for removing this area. They will call you to schedule this.   If they are not able to do this we would be willing to remove this for you.  We will get you scheduled for an MRI of this area to get a better idea of how this looks inside.    You are scheduled for an MRI of your hip at [facility] on 07/31/23. You will need to arrive at the [facility] entrance of the [facility] and check in at the Radiology desk at 2:30 pm.   We will call you with the results.

## 2023-07-19 ENCOUNTER — Other Ambulatory Visit: Payer: Medicare Other

## 2023-07-31 ENCOUNTER — Ambulatory Visit
Admission: RE | Admit: 2023-07-31 | Discharge: 2023-07-31 | Disposition: A | Discharge status: Home / Self Care | Payer: Medicare Other | Source: Ambulatory Visit | Attending: Surgery | Admitting: Surgery

## 2023-07-31 DIAGNOSIS — R2242 Localized swelling, mass and lump, left lower limb: Secondary | ICD-10-CM | POA: Diagnosis not present

## 2023-07-31 DIAGNOSIS — Z96642 Presence of left artificial hip joint: Secondary | ICD-10-CM | POA: Diagnosis not present

## 2023-07-31 DIAGNOSIS — M25552 Pain in left hip: Secondary | ICD-10-CM | POA: Diagnosis not present

## 2023-07-31 DIAGNOSIS — M7989 Other specified soft tissue disorders: Secondary | ICD-10-CM | POA: Diagnosis not present

## 2023-07-31 DIAGNOSIS — M799 Soft tissue disorder, unspecified: Secondary | ICD-10-CM | POA: Diagnosis not present

## 2023-07-31 MED ORDER — GADOBUTROL 1 MMOL/ML IV SOLN
10.0000 mL | Freq: Once | INTRAVENOUS | Status: AC | PRN
  Administered 2023-07-31: 10 mL via INTRAVENOUS

## 2023-08-01 ENCOUNTER — Telehealth: Payer: Self-pay | Admitting: *Deleted

## 2023-08-01 DIAGNOSIS — M25562 Pain in left knee: Secondary | ICD-10-CM | POA: Diagnosis not present

## 2023-08-01 DIAGNOSIS — M1712 Unilateral primary osteoarthritis, left knee: Secondary | ICD-10-CM | POA: Diagnosis not present

## 2023-08-01 NOTE — Telephone Encounter (Signed)
   Pre-operative Risk Assessment    Patient Name: Amber Barnes  DOB: 09-27-1950 MRN: [id number]   Date of last office visit: 03/15/23 DR. Lalla Brothers; 02/07/23 DR. GOLLAN  Date of next office visit: NONE   Request for Surgical Clearance    Procedure:  LEFT TKA  Date of Surgery:  Clearance TBD                                Surgeon:  DR. [facility] Surgeon's Group or Practice Name:  [facility] Phone number:  [phone] Fax number:  [phone]   Type of Clearance Requested:   - Medical  - Pharmacy:  Hold Apixaban (Eliquis)     Type of Anesthesia:  Not Indicated (GENERAL?)   Additional requests/questions:    Elpidio Anis   08/01/2023, 6:20 PM

## 2023-08-02 ENCOUNTER — Telehealth: Payer: Self-pay | Admitting: *Deleted

## 2023-08-02 ENCOUNTER — Telehealth: Payer: Self-pay | Admitting: Cardiovascular Disease

## 2023-08-02 NOTE — Telephone Encounter (Signed)
Pt has been scheduled tele preop apt 08/15/22. Pt tells me that her surgery is planned for 09/04/23, though was sent to our office as TBD.   Med rec and consent are done.     Patient Consent for Virtual Visit        Amber Barnes has provided verbal consent on 08/02/2023 for a virtual visit (video or telephone).   CONSENT FOR VIRTUAL VISIT FOR:  Amber Barnes  By participating in this virtual visit I agree to the following:  I hereby voluntarily request, consent and authorize [facility] and its employed or contracted physicians, physician assistants, nurse practitioners or other licensed health care professionals (the Practitioner), to provide me with telemedicine health care services (the "Services") as deemed necessary by the treating Practitioner. I acknowledge and consent to receive the Services by the Practitioner via telemedicine. I understand that the telemedicine visit will involve communicating with the Practitioner through live audiovisual communication technology and the disclosure of certain medical information by electronic transmission. I acknowledge that I have been given the opportunity to request an in-person assessment or other available alternative prior to the telemedicine visit and am voluntarily participating in the telemedicine visit.  I understand that I have the right to withhold or withdraw my consent to the use of telemedicine in the course of my care at any time, without affecting my right to future care or treatment, and that the Practitioner or I may terminate the telemedicine visit at any time. I understand that I have the right to inspect all information obtained and/or recorded in the course of the telemedicine visit and may receive copies of available information for a reasonable fee.  I understand that some of the potential risks of receiving the Services via telemedicine include:  Delay or interruption in medical evaluation due to technological  equipment failure or disruption; Information transmitted may not be sufficient (e.g. poor resolution of images) to allow for appropriate medical decision making by the Practitioner; and/or  In rare instances, security protocols could fail, causing a breach of personal health information.  Furthermore, I acknowledge that it is my responsibility to provide information about my medical history, conditions and care that is complete and accurate to the best of my ability. I acknowledge that Practitioner's advice, recommendations, and/or decision may be based on factors not within their control, such as incomplete or inaccurate data provided by me or distortions of diagnostic images or specimens that may result from electronic transmissions. I understand that the practice of medicine is not an exact science and that Practitioner makes no warranties or guarantees regarding treatment outcomes. I acknowledge that a copy of this consent can be made available to me via my patient portal [facility] MyChart), or I can request a printed copy by calling the office of [facility].    I understand that my insurance will be billed for this visit.   I have read or had this consent read to me. I understand the contents of this consent, which adequately explains the benefits and risks of the Services being provided via telemedicine.  I have been provided ample opportunity to ask questions regarding this consent and the Services and have had my questions answered to my satisfaction. I give my informed consent for the services to be provided through the use of telemedicine in my medical care

## 2023-08-02 NOTE — Telephone Encounter (Signed)
Patient with diagnosis of atrial fibrillation on Eliquis for anticoagulation.    Procedure:  LEFT TKA   Date of Surgery:  Clearance TBD   CHA2DS2-VASc Score = 4   This indicates a 4.8% annual risk of stroke. The patient's score is based upon: CHF History: 1 HTN History: 1 Diabetes History: 0 Stroke History: 0 Vascular Disease History: 0 Age Score: 1 Gender Score: 1    CrCl 80 Platelet count 130  Per office protocol, patient can hold Eliquis for 3 days prior to procedure.   Patient will not need bridging with Lovenox (enoxaparin) around procedure.  **This guidance is not considered finalized until pre-operative APP has relayed final recommendations.**

## 2023-08-02 NOTE — Telephone Encounter (Signed)
   Name: Amber Barnes  DOB: 31-Oct-1950  MRN: [id number]  Primary Cardiologist: Julien Nordmann, MD   Preoperative team, please contact this patient and set up a phone call appointment for further preoperative risk assessment. Please obtain consent and complete medication review. Thank you for your help.  I confirm that guidance regarding antiplatelet and oral anticoagulation therapy has been completed and, if necessary, noted below.  Per office protocol, patient can hold Eliquis for 3 days prior to procedure.   Patient will not need bridging with Lovenox (enoxaparin) around procedure.  I also confirmed the patient resides in the state of [place]. As per [facility] Medical Board telemedicine laws, the patient must reside in the state in which the provider is licensed.   Napoleon Form, Leodis Rains, NP 08/02/2023, 9:18 AM [facility]

## 2023-08-02 NOTE — Telephone Encounter (Signed)
Left message to call back to schedule tele pre op appt.  

## 2023-08-02 NOTE — Telephone Encounter (Signed)
Pt has been scheduled tele preop apt 08/15/22. Pt tells me that her surgery is planned for 09/04/23, though was sent to our office as TBD.    Med rec and consent are done.

## 2023-08-02 NOTE — Telephone Encounter (Signed)
Pharmacy please advise on holding Eliquis prior to left total knee arthroplasty scheduled for TBD. Thank you.

## 2023-08-02 NOTE — Telephone Encounter (Signed)
Patient returned Pre-Op call to schedule a tele-visit.

## 2023-08-02 NOTE — Telephone Encounter (Signed)
Wilson, Jasmin B17 minutes ago (3:33 PM)   JW Patient returned Pre-Op call to schedule a tele-visit.      Note   Amber Barnes, Amber "Annice Pih" [phone]  Annetta Maw

## 2023-08-07 ENCOUNTER — Encounter: Payer: Self-pay | Admitting: Plastic Surgery

## 2023-08-07 ENCOUNTER — Ambulatory Visit: Payer: Medicare Other | Admitting: Plastic Surgery

## 2023-08-07 ENCOUNTER — Other Ambulatory Visit: Payer: Self-pay | Admitting: Family

## 2023-08-07 VITALS — BP 172/82 | HR 58 | Ht 65.0 in | Wt 248.2 lb

## 2023-08-07 DIAGNOSIS — E65 Localized adiposity: Secondary | ICD-10-CM

## 2023-08-07 DIAGNOSIS — F32A Depression, unspecified: Secondary | ICD-10-CM

## 2023-08-07 NOTE — Progress Notes (Signed)
Referring Provider Allegra Grana, FNP [address] [place],  [place] [address]   CC:  Chief Complaint  Patient presents with   Advice Only      Amber Barnes is an 73 y.o. female.  HPI: Amber Barnes is a very nice 73 year old female who presents today with complaints of increasing size and pain in the soft tissues over the left hip.  She denies any specific trauma to the region.  She has had a hip replacement on the left side with significant pain prior to the hip replacement.  She has a history of breast cancer and is concerned about the increasing size on the left hip.  Allergies  Allergen Reactions   Hydrocodone    Prednisone Other (See Comments)    Agitation, hyperactivity, polyphagia   Amiodarone Nausea And Vomiting and Other (See Comments)   Hydrocodone-Acetaminophen Other (See Comments)    Hallucinations and combative   Tapentadol Itching   Adhesive [Tape] Itching and Rash    Prefers paper tape   Latex Itching and Rash    use paper tap   Oxycodone Itching    Can take it with benadryl   Tramadol Itching    Can take it with benadryl   Wound Dressing Adhesive     Outpatient Encounter Medications as of 08/07/2023  Medication Sig   acetaminophen (TYLENOL) 500 MG tablet Take 2 tablets (1,000 mg total) by mouth every 8 (eight) hours.   ALPRAZolam (XANAX) 0.25 MG tablet TAKE 1 TABLET BY MOUTH TWICE A DAY AS NEEDED FOR ANXIETY (Patient taking differently: Take 0.25 mg by mouth at bedtime as needed for anxiety or sleep.)   bisoprolol (ZEBETA) 10 MG tablet Take 1 tablet (10 mg total) by mouth 2 (two) times daily.   busPIRone (BUSPAR) 5 MG tablet Take 1 tablet (5 mg total) by mouth 2 (two) times daily.   Cholecalciferol (VITAMIN D3) 5000 units CAPS Take 5,000 Units by mouth daily.   ELIQUIS 5 MG TABS tablet TAKE 1 TABLET BY MOUTH TWICE A DAY   escitalopram (LEXAPRO) 10 MG tablet Take 1 tablet (10 mg total) by mouth daily.   furosemide (LASIX) 20 MG tablet Take  1 tablet (20 mg total) by mouth daily. May take additional 20mg  PO prn for leg swelling   losartan-hydrochlorothiazide (HYZAAR) 50-12.5 MG tablet Take 0.5 tablets by mouth daily as needed (for BP 140/80).   Multiple Vitamin (MULTIVITAMIN) tablet Take 1 tablet by mouth daily.   nystatin cream (MYCOSTATIN) Apply 1 application. topically 2 (two) times daily. Prn abdomen (Patient taking differently: Apply 1 application  topically 2 (two) times daily as needed for dry skin. Prn abdomen)   potassium chloride SA (KLOR-CON M) 20 MEQ tablet Take 1 tablet (20 mEq total) by mouth daily. (Patient taking differently: Take 20 mEq by mouth every morning. And if she takes additional Lasix she will take another K+)   ranolazine (RANEXA) 500 MG 12 hr tablet TAKE 1 TABLET BY MOUTH TWICE A DAY   rOPINIRole (REQUIP) 0.5 MG tablet Take 1 tablet (0.5 mg total) by mouth at bedtime.   rOPINIRole (REQUIP) 3 MG tablet Take 1 tablet (3 mg total) by mouth at bedtime.   rosuvastatin (CRESTOR) 40 MG tablet TAKE 1 TABLET BY MOUTH EVERY DAY   [DISCONTINUED] prochlorperazine (COMPAZINE) 10 MG tablet Take 1 tablet (10 mg total) by mouth every 6 (six) hours as needed (Nausea or vomiting).   No facility-administered encounter medications on file as of 08/07/2023.  Past Medical History:  Diagnosis Date   Achilles tendinitis    Acute medial meniscal tear    Allergy    Anemia    Anxiety    a.) on BZO PRN (alprazolam)   Aortic atherosclerosis (HCC)    Ascending aorta dilatation (HCC) 10/20/2022   a.) TTE 10/20/2022: asc Ao 39 mm   Atrial fibrillation/flutter (HCC) 07/2016   a.) Dx'd 07/2016; b.) CHA2DS2-VASc = 5 (age, sex, HFmrEF, HTN, vasc disease history) as of 05/09/2023; c.) s/p DCCV 09/07/2016 (150 J x 2 --> A.flutter; 200 J x 1 --> NSR); d.) s/p DCCV 10/19/2016 (150 J x 1 --> NSR); e.) brought in for DCCV 12/2016; cancelled d/t NSR; f.) s/p PVI ablation 12/12/2022; g.) cardiac rate/rhythm maintained on oral bisoprolol;  chronically anticoagulated using apixaban   BMI 50.0-59.9, adult (HCC) 07/16/2018   CAD (coronary artery disease)    a.) cCTA 12/08/2022: Ca2+ = 31.6 (58th %'ile for age/sex/race match control)   Chemotherapy induced nausea and vomiting 05/18/2021   Chronic heart failure with mid-range ejection fraction (HFmrEF) (HCC)    a.) TTE 03/08/21: EF 60-65, mild LVH, G1DD, mild LAE, triv MR; b.) TTE 07/29/21: EF 60-65, G1DD, mild LAE, triv MR; c.) TTE 11/26/21: EF 55-60, G1DD, mild LAE, mild MR; d.) TTE 02/25/22: EF 60-65, G1DD, mild LAE, mild MR; e.) TTE 05/31/22: EF 55-60, mild LVH, G2DD, mild-mod MR; f.) TTE 10/20/22: EF 45-50, mild LAE, mod red RVSF, mild-mod TR, Asc Ao 39; g.) TTE 12/13/22: EF 50-55, mild LVH, mild MAC   CKD (chronic kidney disease), stage II    Colon polyp    Complication of anesthesia    a.) emergence delirium; b.) hypoxia after hip replacement   COPD (chronic obstructive pulmonary disease) (HCC)    DDD (degenerative disc disease), lumbar    a.) s/p fusion   Depression    Endometriosis    Fibrocystic breast disease    GERD (gastroesophageal reflux disease)    History of stress test    a.) MV 07/29/2016: EF 55-65%. Small, mild defect  in mid anteroseptal, apical septal, and apical locations. No ischemia-->low risk; b.) MV 10/2022: mod sized partially rev apical ant, apical septal, and apical defect. EF 52%. No signific cor Ca2+; c.) cCTA 12/08/2022: Ca2+ = 31.6 (58th %'ile).   History of tobacco use    Hyperlipidemia    Hypertension    Hypothyroidism    a.) s/p partial thyroidectomy for nodule; pathology benign   Insomnia    Lipoma    Malignant neoplasm of upper-outer quadrant of right female breast (HCC) 03/03/2021   a.) stage 1A invasive mammary carcinoma (cT1b or T1c N0); ER (-), PR (+), HER2/neu (+); did not tolerate adjuvant chemotherapy (paclitacel + trastuzumab) despite paclitaxel dose reduction; s/p adjuvant XRT; cinished maintenance trastuzumab 07/27/2022; no adjuvant  endocrine therapy as no clear benefit   Migraine    Nephrolithiasis    On apixaban therapy    OSA (obstructive sleep apnea)    a.) non-compliant with nocturnal PAP therapy   Osteoarthritis    Osteopenia    Peptic ulcer disease    a.) H. pylori   Pericarditis 12/12/2022   a.) following A.fib ablation; resolved   Pneumonia    Port-A-Cath in place    RLS (restless legs syndrome)    a.) on ropinirole   Vitamin D deficiency     Past Surgical History:  Procedure Laterality Date   ABDOMINAL HYSTERECTOMY     ovaries left   ACHILLES TENDON SURGERY  2012,01/2017   ATRIAL FIBRILLATION ABLATION N/A 12/12/2022   Procedure: ATRIAL FIBRILLATION ABLATION;  Surgeon: Lanier Prude, MD;  Location[facility] INVASIVE CV LAB;  Service: Cardiovascular;  Laterality: N/A;   BREAST BIOPSY Right 01/15/2021   Affirm biopsy-"X" clip-INVASIVE MAMMARY CARCINOMA   BREAST BIOPSY Right 01/15/2021   u/s bx-"venus" clip INVASIVE MAMMARY CARCINOMA   BREAST BIOPSY     BREAST BIOPSY Right 01/04/2023   u/s bx ribbon clip path pending   BREAST BIOPSY Right 01/04/2023   [place] RT BREAST BX W LOC DEV 1ST LESION IMG BX SPEC US GUIDE 01/04/2023 [facility]   BREAST LUMPECTOMY,RADIO FREQ LOCALIZER,AXILLARY SENTINEL LYMPH NODE BIOPSY Right 02/17/2021   Procedure: BREAST LUMPECTOMY,RADIO FREQ LOCALIZER,AXILLARY SENTINEL LYMPH NODE BIOPSY;  Surgeon: Campbell Lerner, MD;  Location: [facility];  Service: General;  Laterality: Right;   CARDIOVERSION N/A 09/07/2016   Procedure: CARDIOVERSION;  Surgeon: Antonieta Iba, MD;  Location: [facility];  Service: Cardiovascular;  Laterality: N/A;   CARDIOVERSION N/A 10/19/2016   Procedure: CARDIOVERSION;  Surgeon: Antonieta Iba, MD;  Location: [facility];  Service: Cardiovascular;  Laterality: N/A;   CARDIOVERSION N/A 12/14/2016   Procedure: CARDIOVERSION;  Surgeon: Antonieta Iba, MD;  Location: [facility];  Service: Cardiovascular;  Laterality: N/A;   COLONOSCOPY     COLONOSCOPY  WITH PROPOFOL N/A 05/13/2020   Procedure: COLONOSCOPY WITH PROPOFOL;  Surgeon: Toledo, Boykin Nearing, MD;  Location: [facility];  Service: Gastroenterology;  Laterality: N/A;  ELIQUIS   CYSTOSCOPY WITH STENT PLACEMENT Bilateral 07/12/2021   Procedure: CYSTOSCOPY WITH STENT PLACEMENT;  Surgeon: Sondra Come, MD;  Location: [facility];  Service: Urology;  Laterality: Bilateral;   CYSTOSCOPY/URETEROSCOPY/HOLMIUM LASER/STENT PLACEMENT Bilateral 07/30/2021   Procedure: CYSTOSCOPY/URETEROSCOPY/HOLMIUM LASER/BILATERAL STENT REMOVAL;  Surgeon: Sondra Come, MD;  Location: [facility];  Service: Urology;  Laterality: Bilateral;   ESOPHAGOGASTRODUODENOSCOPY     ESOPHAGOGASTRODUODENOSCOPY (EGD) WITH PROPOFOL N/A 05/13/2020   Procedure: ESOPHAGOGASTRODUODENOSCOPY (EGD) WITH PROPOFOL;  Surgeon: Toledo, Boykin Nearing, MD;  Location: [facility];  Service: Gastroenterology;  Laterality: N/A;   FOOT SURGERY Left    tendon   LUMBAR FUSION     PORTA CATH INSERTION N/A 03/23/2021   Procedure: PORTA CATH INSERTION;  Surgeon: Renford Dills, MD;  Location: [facility] INVASIVE CV LAB;  Service: Cardiovascular;  Laterality: N/A;   PORTACATH PLACEMENT N/A 03/23/2021   Procedure: ATTEMPTED INSERTION PORT-A-CATH;  Surgeon: Campbell Lerner, MD;  Location: [facility];  Service: General;  Laterality: N/A;   THYROIDECTOMY  1990   half thyroid lobectomy secondary to a benign nodule   TOTAL HIP ARTHROPLASTY Right 08/21/2018   Procedure: TOTAL HIP ARTHROPLASTY ANTERIOR APPROACH-RIGHT CELL SAVER REQUESTED;  Surgeon: Kennedy Bucker, MD;  Location: [facility];  Service: Orthopedics;  Laterality: Right;   TOTAL HIP ARTHROPLASTY Left 05/11/2023   Procedure: TOTAL HIP ARTHROPLASTY ANTERIOR APPROACH;  Surgeon: Reinaldo Berber, MD;  Location: [facility];  Service: Orthopedics;  Laterality: Left;    Family History  Problem Relation Age of Onset   Cancer Mother        leukemia   Cancer Father 31       colon   Heart disease Father     Heart attack Father 31       died from MI   Hyperlipidemia Father    Hypertension Father    Cancer Sister 8       ovarian   Depression Sister    Cancer Brother        CLL   Breast  cancer Paternal Aunt        62's   Migraines Neg Hx    Thyroid cancer Neg Hx     Social History   Social History Narrative   Moved to [place] from [place], originally from [place].    1 cup of caffeine daily   Right handed   Lives with husband, step-daughter and child         Review of Systems General: Denies fevers, chills, weight loss CV: Denies chest pain, shortness of breath, palpitations Left hip, soft tissues: Patient reports increasing size and discomfort in the region  Physical Exam    08/07/2023   11:24 AM 08/07/2023   10:53 AM 07/18/2023   10:48 AM  Vitals with BMI  Height  5\' 5"  5\' 5"   Weight  248 lbs 3 oz 244 lbs  BMI  41.3 40.6  Systolic 172 179 578  Diastolic 82 104 89  Pulse 58 59 52    General:  No acute distress,  Alert and oriented, Non-Toxic, Normal speech and affect Left hip, soft tissues: On physical exam there is clearly asymmetry between the right and left side with the left side being noticeably larger than the right.  On physical exam I cannot find any discrete mass within the tissues.  There is no specific point of tenderness.  There is a well-healed incision on the anterior portion of the hip. Mammogram: Mammogram in October 2024.  Short-term follow-up arranged.  MRI, left hip:    IMPRESSION: 1. Left total hip arthroplasty with susceptibility artifact resulting from the orthopedic hardware partially obscuring the adjacent soft tissue and osseous structures. Mild marrow edema in the left proximal femur likely postsurgical. No periarticular fluid collection or osteolysis. 2. A 2.3 x 1.2 x 5 cm fluid collection along the anterior margin of the tensor fascia lata muscle likely reflecting a small postoperative seroma deep to the incision  site. 3. Otherwise, no soft tissue mass, fluid collection or hematoma overlying the left hip. Mild soft tissue edema in the subcutaneous fat overlying the left hip with skin thickening which may be secondary to lymphedema or cellulitis Assessment/Plan Left hip, soft tissue asymmetry: I do not feel any specific mass on physical exam.  I cannot appreciate any mass on the MRI. MRI report does not show a specific soft tissue mass. At this point I do not have a surgical intervention that would be reasonable to pursue.  If there was a discrete mass this of course should be removed for pathologic diagnosis but I cannot palpate 1 and there is no evidence of 1 on the MRI.  Lipo suction for treatment of pain is not indicated. I have made arrangements to speak with Amber Barnes again on Thursday as her MRI results were not available prior to her appointment today.  I would recommend following up with her orthopedic surgeon to discuss the ongoing discomfort.  45 minutes spent reviewing the patient's chart and MRI, examining the patient and discussing her findings, documenting care.  Santiago Glad 08/07/2023, 4:54 PM

## 2023-08-10 ENCOUNTER — Ambulatory Visit (INDEPENDENT_AMBULATORY_CARE_PROVIDER_SITE_OTHER): Payer: Medicare Other | Admitting: Plastic Surgery

## 2023-08-10 DIAGNOSIS — E65 Localized adiposity: Secondary | ICD-10-CM

## 2023-08-10 NOTE — Progress Notes (Signed)
Spoke with Amber Barnes this morning by phone.  I was in my office and she was at a physician's appointment with her husband.  She confirmed who she was.  I discussed with her the findings from the MRI which do not show any mass within the soft tissues.  Given the MRI results and the fact that I cannot feel any discrete mass I do not see a role for surgery in her care.  I have spoken with Dr. Claudine Mouton about this and he agrees.  It may be reasonable for her to speak with her orthopedic surgeon again to see if there is anything from the standpoint of the hip replacement that may be causing discomfort.  She may follow-up with me as needed.  40 minutes spent reviewing her chart reviewing the MRI coordinating care and documenting.

## 2023-08-14 ENCOUNTER — Telehealth: Payer: Self-pay | Admitting: Cardiovascular Disease

## 2023-08-14 NOTE — Telephone Encounter (Signed)
Returned call to pt. She thought her appt was today, 08/14/23, however, it isn't until 08/16/23.  Left a detailed message that if she needed to change it that she can call back and ask for the preop team.

## 2023-08-14 NOTE — Telephone Encounter (Signed)
Pt would like a c/b regarding rescheduling Pre Op appt. Please advise

## 2023-08-16 ENCOUNTER — Ambulatory Visit: Payer: Medicare Other | Attending: Cardiology | Admitting: Cardiology

## 2023-08-16 DIAGNOSIS — Z01810 Encounter for preprocedural cardiovascular examination: Secondary | ICD-10-CM | POA: Diagnosis not present

## 2023-08-16 NOTE — Assessment & Plan Note (Addendum)
#  Right breast invasive carcinoma pT1b pN0 ER negative, PR weakly positive HER 2 positive.  Not on endocrine therapy due to unclear magnitude of benefit Patient does not tolerate adjuvant chemotherapy with Taxol  and trastuzumab  despite Taxol  dose reduction.-->Status post adjuvant radiation--> 07/27/2022 Finished maintenance Trastuzumab  Labs are reviewed and discussed with patient. She is doing well clinically.  Continue annual diagnostic mammogram next due June 2025

## 2023-08-16 NOTE — Progress Notes (Signed)
 Virtual Visit via Telephone Note   Because of Amber Barnes's co-morbid illnesses, she is at least at moderate risk for complications without adequate follow up.  This format is felt to be most appropriate for this patient at this time.  The patient did not have access to video technology/had technical difficulties with video requiring transitioning to audio format only (telephone).  All issues noted in this document were discussed and addressed.  No physical exam could be performed with this format.  Please refer to the patient's chart for her consent to telehealth for [facility].  Evaluation Performed:  Preoperative cardiovascular risk assessment _____________   Date:  08/16/2023   Patient ID:  Amber Barnes, DOB [birth year], MRN [id number] Patient Location:  Home Provider location:   Office  Primary Care Provider:  Dineen Rollene MATSU, FNP Primary Cardiologist:  Evalene Lunger, MD  Chief Complaint / Patient Profile   73 y.o. y/o female with a h/o persistent atrial fibrillation s/p ablation, hypertension, chronic diastolic HF, hyperlipidemia who is pending Left TKA with Dr. Lorelle and presents today for telephonic preoperative cardiovascular risk assessment.  History of Present Illness    Amber Barnes is a 73 y.o. female who presents via audio/video conferencing for a telehealth visit today.  Pt was last seen in cardiology clinic on 03/15/23 by Dr. Cindie.  At that time Amber Barnes was doing well.  The patient is now pending procedure as outlined above. Since her last visit, she has done well from a cardiac standpoint.  Today she denies chest pain, shortness of breath, lower extremity edema, fatigue, palpitations, melena, hematuria, hemoptysis, diaphoresis, weakness, presyncope, syncope, orthopnea, and PND.  Past Medical History    Past Medical History:  Diagnosis Date   Achilles tendinitis    Acute medial meniscal tear    Allergy    Anemia    Anxiety     a.) on BZO PRN (alprazolam )   Aortic atherosclerosis (HCC)    Ascending aorta dilatation (HCC) 10/20/2022   a.) TTE 10/20/2022: asc Ao 39 mm   Atrial fibrillation/flutter (HCC) 07/2016   a.) Dx'd 07/2016; b.) CHA2DS2-VASc = 5 (age, sex, HFmrEF, HTN, vasc disease history) as of 05/09/2023; c.) s/p DCCV 09/07/2016 (150 J x 2 --> A.flutter; 200 J x 1 --> NSR); d.) s/p DCCV 10/19/2016 (150 J x 1 --> NSR); e.) brought in for DCCV 12/2016; cancelled d/t NSR; f.) s/p PVI ablation 12/12/2022; g.) cardiac rate/rhythm maintained on oral bisoprolol ; chronically anticoagulated using apixaban    BMI 50.0-59.9, adult (HCC) 07/16/2018   CAD (coronary artery disease)    a.) cCTA 12/08/2022: Ca2+ = 31.6 (58th %'ile for age/sex/race match control)   Chemotherapy induced nausea and vomiting 05/18/2021   Chronic heart failure with mid-range ejection fraction (HFmrEF) (HCC)    a.) TTE 03/08/21: EF 60-65, mild LVH, G1DD, mild LAE, triv MR; b.) TTE 07/29/21: EF 60-65, G1DD, mild LAE, triv MR; c.) TTE 11/26/21: EF 55-60, G1DD, mild LAE, mild MR; d.) TTE 02/25/22: EF 60-65, G1DD, mild LAE, mild MR; e.) TTE 05/31/22: EF 55-60, mild LVH, G2DD, mild-mod MR; f.) TTE 10/20/22: EF 45-50, mild LAE, mod red RVSF, mild-mod TR, Asc Ao 39; g.) TTE 12/13/22: EF 50-55, mild LVH, mild MAC   CKD (chronic kidney disease), stage II    Colon polyp    Complication of anesthesia    a.) emergence delirium; b.) hypoxia after hip replacement   COPD (chronic obstructive pulmonary disease) (HCC)    DDD (degenerative disc disease), lumbar  a.) s/p fusion   Depression    Endometriosis    Fibrocystic breast disease    GERD (gastroesophageal reflux disease)    History of stress test    a.) MV 07/29/2016: EF 55-65%. Small, mild defect  in mid anteroseptal, apical septal, and apical locations. No ischemia-->low risk; b.) MV 10/2022: mod sized partially rev apical ant, apical septal, and apical defect. EF 52%. No signific cor Ca2+; c.) cCTA 12/08/2022:  Ca2+ = 31.6 (58th %'ile).   History of tobacco use    Hyperlipidemia    Hypertension    Hypothyroidism    a.) s/p partial thyroidectomy for nodule; pathology benign   Insomnia    Lipoma    Malignant neoplasm of upper-outer quadrant of right female breast (HCC) 03/03/2021   a.) stage 1A invasive mammary carcinoma (cT1b or T1c N0); ER (-), PR (+), HER2/neu (+); did not tolerate adjuvant chemotherapy (paclitacel + trastuzumab ) despite paclitaxel  dose reduction; s/p adjuvant XRT; cinished maintenance trastuzumab  07/27/2022; no adjuvant endocrine therapy as no clear benefit   Migraine    Nephrolithiasis    On apixaban  therapy    OSA (obstructive sleep apnea)    a.) non-compliant with nocturnal PAP therapy   Osteoarthritis    Osteopenia    Peptic ulcer disease    a.) H. pylori   Pericarditis 12/12/2022   a.) following A.fib ablation; resolved   Pneumonia    Port-A-Cath in place    RLS (restless legs syndrome)    a.) on ropinirole    Vitamin D  deficiency    Past Surgical History:  Procedure Laterality Date   ABDOMINAL HYSTERECTOMY     ovaries left   ACHILLES TENDON SURGERY     2012,01/2017   ATRIAL FIBRILLATION ABLATION N/A 12/12/2022   Procedure: ATRIAL FIBRILLATION ABLATION;  Surgeon: Cindie Ole DASEN, MD;  Location: [facility];  Service: Cardiovascular;  Laterality: N/A;   BREAST BIOPSY Right 01/15/2021   Affirm biopsy-X clip-INVASIVE MAMMARY CARCINOMA   BREAST BIOPSY Right 01/15/2021   u/s bx-venus clip INVASIVE MAMMARY CARCINOMA   BREAST BIOPSY     BREAST BIOPSY Right 01/04/2023   u/s bx ribbon clip path pending   BREAST BIOPSY Right 01/04/2023   US  RT BREAST BX W LOC DEV 1ST LESION IMG BX SPEC US  GUIDE 01/04/2023 [facility]   BREAST LUMPECTOMY,RADIO FREQ LOCALIZER,AXILLARY SENTINEL LYMPH NODE BIOPSY Right 02/17/2021   Procedure: BREAST LUMPECTOMY,RADIO FREQ LOCALIZER,AXILLARY SENTINEL LYMPH NODE BIOPSY;  Surgeon: Lane Shope, MD;  Location: [facility];   Service: General;  Laterality: Right;   CARDIOVERSION N/A 09/07/2016   Procedure: CARDIOVERSION;  Surgeon: Evalene JINNY Lunger, MD;  Location: [facility];  Service: Cardiovascular;  Laterality: N/A;   CARDIOVERSION N/A 10/19/2016   Procedure: CARDIOVERSION;  Surgeon: Evalene JINNY Lunger, MD;  Location: [facility];  Service: Cardiovascular;  Laterality: N/A;   CARDIOVERSION N/A 12/14/2016   Procedure: CARDIOVERSION;  Surgeon: Lunger Evalene JINNY, MD;  Location: [facility];  Service: Cardiovascular;  Laterality: N/A;   COLONOSCOPY     COLONOSCOPY WITH PROPOFOL  N/A 05/13/2020   Procedure: COLONOSCOPY WITH PROPOFOL ;  Surgeon: Toledo, Ladell POUR, MD;  Location: [facility];  Service: Gastroenterology;  Laterality: N/A;  ELIQUIS    CYSTOSCOPY WITH STENT PLACEMENT Bilateral 07/12/2021   Procedure: CYSTOSCOPY WITH STENT PLACEMENT;  Surgeon: Francisca Redell BROCKS, MD;  Location: [facility];  Service: Urology;  Laterality: Bilateral;   CYSTOSCOPY/URETEROSCOPY/HOLMIUM LASER/STENT PLACEMENT Bilateral 07/30/2021   Procedure: CYSTOSCOPY/URETEROSCOPY/HOLMIUM LASER/BILATERAL STENT REMOVAL;  Surgeon: Francisca Redell BROCKS, MD;  Location: [facility];  Service: Urology;  Laterality: Bilateral;   ESOPHAGOGASTRODUODENOSCOPY     ESOPHAGOGASTRODUODENOSCOPY (EGD) WITH PROPOFOL  N/A 05/13/2020   Procedure: ESOPHAGOGASTRODUODENOSCOPY (EGD) WITH PROPOFOL ;  Surgeon: Toledo, Ladell POUR, MD;  Location: [facility];  Service: Gastroenterology;  Laterality: N/A;   FOOT SURGERY Left    tendon   LUMBAR FUSION     PORTA CATH INSERTION N/A 03/23/2021   Procedure: PORTA CATH INSERTION;  Surgeon: Jama Cordella MATSU, MD;  Location: [facility];  Service: Cardiovascular;  Laterality: N/A;   PORTACATH PLACEMENT N/A 03/23/2021   Procedure: ATTEMPTED INSERTION PORT-A-CATH;  Surgeon: Lane Shope, MD;  Location: [facility];  Service: General;  Laterality: N/A;   THYROIDECTOMY  1990   half thyroid  lobectomy secondary to a benign nodule   TOTAL HIP  ARTHROPLASTY Right 08/21/2018   Procedure: TOTAL HIP ARTHROPLASTY ANTERIOR APPROACH-RIGHT CELL SAVER REQUESTED;  Surgeon: Kathlynn Sharper, MD;  Location: [facility];  Service: Orthopedics;  Laterality: Right;   TOTAL HIP ARTHROPLASTY Left 05/11/2023   Procedure: TOTAL HIP ARTHROPLASTY ANTERIOR APPROACH;  Surgeon: Lorelle Hussar, MD;  Location: [facility];  Service: Orthopedics;  Laterality: Left;   Allergies Allergies  Allergen Reactions   Hydrocodone     Prednisone  Other (See Comments)    Agitation, hyperactivity, polyphagia   Amiodarone  Nausea And Vomiting and Other (See Comments)   Hydrocodone -Acetaminophen  Other (See Comments)    Hallucinations and combative   Tapentadol Itching   Adhesive [Tape] Itching and Rash    Prefers paper tape   Latex Itching and Rash    use paper tap   Oxycodone  Itching    Can take it with benadryl    Tramadol  Itching    Can take it with benadryl    Wound Dressing Adhesive    Home Medications    Prior to Admission medications   Medication Sig Start Date End Date Taking? Authorizing Provider  acetaminophen  (TYLENOL ) 500 MG tablet Take 2 tablets (1,000 mg total) by mouth every 8 (eight) hours. 05/12/23   Charlene Debby BROCKS, PA-C  ALPRAZolam  (XANAX ) 0.25 MG tablet TAKE 1 TABLET BY MOUTH TWICE A DAY AS NEEDED FOR ANXIETY 08/08/23   Dineen Rollene MATSU, FNP  bisoprolol  (ZEBETA ) 10 MG tablet Take 1 tablet (10 mg total) by mouth 2 (two) times daily. 02/07/23   Gollan, Timothy J, MD  busPIRone  (BUSPAR ) 5 MG tablet Take 1 tablet (5 mg total) by mouth 2 (two) times daily. 07/14/23   Dineen Rollene MATSU, FNP  Cholecalciferol  (VITAMIN D3) 5000 units CAPS Take 5,000 Units by mouth daily.    [provider]  ELIQUIS  5 MG TABS tablet TAKE 1 TABLET BY MOUTH TWICE A DAY 02/27/23   Gollan, Timothy J, MD  escitalopram  (LEXAPRO ) 10 MG tablet Take 1 tablet (10 mg total) by mouth daily. 07/14/23   Dineen Rollene MATSU, FNP  furosemide  (LASIX ) 20 MG tablet Take 1 tablet (20 mg total)  by mouth daily. May take additional 20mg  PO prn for leg swelling 03/14/23   Dineen Rollene MATSU, FNP  losartan -hydrochlorothiazide  (HYZAAR) 50-12.5 MG tablet Take 0.5 tablets by mouth daily as needed (for BP 140/80). 07/14/23   Dineen Rollene MATSU, FNP  Multiple Vitamin (MULTIVITAMIN) tablet Take 1 tablet by mouth daily.    [provider]  nystatin  cream (MYCOSTATIN ) Apply 1 application. topically 2 (two) times daily. Prn abdomen Patient taking differently: Apply 1 application  topically 2 (two) times daily as needed for dry skin. Prn abdomen 10/28/21   McLean-Scocuzza, Randine SAILOR, MD  potassium chloride  SA (KLOR-CON  M)  20 MEQ tablet Take 1 tablet (20 mEq total) by mouth daily. Patient taking differently: Take 20 mEq by mouth every morning. And if she takes additional Lasix  she will take another K+ 11/08/22   Gollan, Timothy J, MD  ranolazine  (RANEXA ) 500 MG 12 hr tablet TAKE 1 TABLET BY MOUTH TWICE A DAY 03/14/23   Gollan, Timothy J, MD  rOPINIRole  (REQUIP ) 0.5 MG tablet Take 1 tablet (0.5 mg total) by mouth at bedtime. 06/16/23   Dineen Rollene MATSU, FNP  rOPINIRole  (REQUIP ) 3 MG tablet Take 1 tablet (3 mg total) by mouth at bedtime. 06/16/23   Dineen Rollene MATSU, FNP  rosuvastatin  (CRESTOR ) 40 MG tablet TAKE 1 TABLET BY MOUTH EVERY DAY 03/10/23   Dineen Rollene MATSU, FNP  prochlorperazine  (COMPAZINE ) 10 MG tablet Take 1 tablet (10 mg total) by mouth every 6 (six) hours as needed (Nausea or vomiting). 03/19/21 03/02/22  [name], MD    Physical Exam    Vital Signs:  Amber Barnes does not have vital signs available for review today.  Given telephonic nature of communication, physical exam is limited. AAOx3. NAD. Normal affect.  Speech and respirations are unlabored.  Accessory Clinical Findings   None Assessment & Plan    1.  Preoperative Cardiovascular Risk Assessment: Left TKA with Dr. Lorelle   Amber Barnes's perioperative risk of a major cardiac event is 0.9% according to the Revised Cardiac  Risk Index (RCRI).  Therefore, she is at low risk for perioperative complications.   Her functional capacity is fair at 6.05 METs according to the Duke Activity Status Index (DASI). Recommendations: According to ACC/AHA guidelines, no further cardiovascular testing needed.  The patient may proceed to surgery at acceptable risk.   Antiplatelet and/or Anticoagulation Recommendations: Eliquis  (Apixaban ) can be held for 3 days prior to surgery.  Please resume post op when felt to be safe.  Patient will not need bridging with Lovenox  around procedure.   The patient was advised that if she develops new symptoms prior to surgery to contact our office to arrange for a follow-up visit, and she verbalized understanding.  A copy of this note will be routed to requesting surgeon.  Time:   Today, I have spent 12 minutes with the patient with telehealth technology discussing medical history, symptoms, and management plan.    Colin Ellers D Marrell Dicaprio, NP  08/16/2023, 8:00 AM

## 2023-08-17 ENCOUNTER — Inpatient Hospital Stay: Payer: Medicare Other | Attending: Oncology

## 2023-08-17 ENCOUNTER — Inpatient Hospital Stay (HOSPITAL_BASED_OUTPATIENT_CLINIC_OR_DEPARTMENT_OTHER): Payer: Medicare Other | Admitting: Oncology

## 2023-08-17 ENCOUNTER — Encounter: Payer: Self-pay | Admitting: Oncology

## 2023-08-17 ENCOUNTER — Telehealth: Payer: Self-pay

## 2023-08-17 VITALS — BP 133/81 | HR 55 | Temp 96.3°F | Resp 18 | Wt 247.8 lb

## 2023-08-17 DIAGNOSIS — Z9221 Personal history of antineoplastic chemotherapy: Secondary | ICD-10-CM | POA: Insufficient documentation

## 2023-08-17 DIAGNOSIS — Z96642 Presence of left artificial hip joint: Secondary | ICD-10-CM | POA: Insufficient documentation

## 2023-08-17 DIAGNOSIS — I4891 Unspecified atrial fibrillation: Secondary | ICD-10-CM | POA: Diagnosis not present

## 2023-08-17 DIAGNOSIS — R234 Changes in skin texture: Secondary | ICD-10-CM | POA: Insufficient documentation

## 2023-08-17 DIAGNOSIS — I130 Hypertensive heart and chronic kidney disease with heart failure and stage 1 through stage 4 chronic kidney disease, or unspecified chronic kidney disease: Secondary | ICD-10-CM | POA: Insufficient documentation

## 2023-08-17 DIAGNOSIS — Z95828 Presence of other vascular implants and grafts: Secondary | ICD-10-CM

## 2023-08-17 DIAGNOSIS — G4733 Obstructive sleep apnea (adult) (pediatric): Secondary | ICD-10-CM | POA: Diagnosis not present

## 2023-08-17 DIAGNOSIS — J449 Chronic obstructive pulmonary disease, unspecified: Secondary | ICD-10-CM | POA: Insufficient documentation

## 2023-08-17 DIAGNOSIS — M8580 Other specified disorders of bone density and structure, unspecified site: Secondary | ICD-10-CM | POA: Diagnosis not present

## 2023-08-17 DIAGNOSIS — Z452 Encounter for adjustment and management of vascular access device: Secondary | ICD-10-CM | POA: Insufficient documentation

## 2023-08-17 DIAGNOSIS — Z79899 Other long term (current) drug therapy: Secondary | ICD-10-CM | POA: Diagnosis not present

## 2023-08-17 DIAGNOSIS — R079 Chest pain, unspecified: Secondary | ICD-10-CM | POA: Insufficient documentation

## 2023-08-17 DIAGNOSIS — Z7901 Long term (current) use of anticoagulants: Secondary | ICD-10-CM | POA: Insufficient documentation

## 2023-08-17 DIAGNOSIS — E039 Hypothyroidism, unspecified: Secondary | ICD-10-CM | POA: Diagnosis not present

## 2023-08-17 DIAGNOSIS — G629 Polyneuropathy, unspecified: Secondary | ICD-10-CM | POA: Insufficient documentation

## 2023-08-17 DIAGNOSIS — C50411 Malignant neoplasm of upper-outer quadrant of right female breast: Secondary | ICD-10-CM

## 2023-08-17 DIAGNOSIS — Z171 Estrogen receptor negative status [ER-]: Secondary | ICD-10-CM

## 2023-08-17 DIAGNOSIS — Z923 Personal history of irradiation: Secondary | ICD-10-CM | POA: Diagnosis not present

## 2023-08-17 DIAGNOSIS — Z87891 Personal history of nicotine dependence: Secondary | ICD-10-CM | POA: Insufficient documentation

## 2023-08-17 DIAGNOSIS — Z860100 Personal history of colon polyps, unspecified: Secondary | ICD-10-CM | POA: Insufficient documentation

## 2023-08-17 DIAGNOSIS — G2581 Restless legs syndrome: Secondary | ICD-10-CM | POA: Diagnosis not present

## 2023-08-17 DIAGNOSIS — Z806 Family history of leukemia: Secondary | ICD-10-CM | POA: Insufficient documentation

## 2023-08-17 DIAGNOSIS — K219 Gastro-esophageal reflux disease without esophagitis: Secondary | ICD-10-CM | POA: Diagnosis not present

## 2023-08-17 DIAGNOSIS — I2510 Atherosclerotic heart disease of native coronary artery without angina pectoris: Secondary | ICD-10-CM | POA: Diagnosis not present

## 2023-08-17 DIAGNOSIS — M1990 Unspecified osteoarthritis, unspecified site: Secondary | ICD-10-CM | POA: Diagnosis not present

## 2023-08-17 DIAGNOSIS — E785 Hyperlipidemia, unspecified: Secondary | ICD-10-CM | POA: Insufficient documentation

## 2023-08-17 DIAGNOSIS — Z803 Family history of malignant neoplasm of breast: Secondary | ICD-10-CM | POA: Insufficient documentation

## 2023-08-17 DIAGNOSIS — Z8041 Family history of malignant neoplasm of ovary: Secondary | ICD-10-CM | POA: Insufficient documentation

## 2023-08-17 LAB — CBC WITH DIFFERENTIAL (CANCER CENTER ONLY)
Abs Immature Granulocytes: 0.01 10*3/uL (ref 0.00–0.07)
Basophils Absolute: 0 10*3/uL (ref 0.0–0.1)
Basophils Relative: 1 %
Eosinophils Absolute: 0.1 10*3/uL (ref 0.0–0.5)
Eosinophils Relative: 2 %
HCT: 39.4 % (ref 36.0–46.0)
Hemoglobin: 13.2 g/dL (ref 12.0–15.0)
Immature Granulocytes: 0 %
Lymphocytes Relative: 26 %
Lymphs Abs: 1.1 10*3/uL (ref 0.7–4.0)
MCH: 30.8 pg (ref 26.0–34.0)
MCHC: 33.5 g/dL (ref 30.0–36.0)
MCV: 92.1 fL (ref 80.0–100.0)
Monocytes Absolute: 0.3 10*3/uL (ref 0.1–1.0)
Monocytes Relative: 6 %
Neutro Abs: 2.7 10*3/uL (ref 1.7–7.7)
Neutrophils Relative %: 65 %
Platelet Count: 152 10*3/uL (ref 150–400)
RBC: 4.28 MIL/uL (ref 3.87–5.11)
RDW: 13.4 % (ref 11.5–15.5)
WBC Count: 4.2 10*3/uL (ref 4.0–10.5)
nRBC: 0 % (ref 0.0–0.2)

## 2023-08-17 LAB — CMP (CANCER CENTER ONLY)
ALT: 13 U/L (ref 0–44)
AST: 19 U/L (ref 15–41)
Albumin: 3.7 g/dL (ref 3.5–5.0)
Alkaline Phosphatase: 75 U/L (ref 38–126)
Anion gap: 8 (ref 5–15)
BUN: 18 mg/dL (ref 8–23)
CO2: 26 mmol/L (ref 22–32)
Calcium: 8.6 mg/dL — ABNORMAL LOW (ref 8.9–10.3)
Chloride: 103 mmol/L (ref 98–111)
Creatinine: 0.96 mg/dL (ref 0.44–1.00)
GFR, Estimated: >60 mL/min (ref >60)
Glucose, Bld: 91 mg/dL (ref 70–99)
Potassium: 4.3 mmol/L (ref 3.5–5.1)
Sodium: 137 mmol/L (ref 135–145)
Total Bilirubin: 0.5 mg/dL (ref 0.0–1.2)
Total Protein: 6.9 g/dL (ref 6.5–8.1)

## 2023-08-17 MED ORDER — SODIUM CHLORIDE 0.9% FLUSH
10.0000 mL | Freq: Once | INTRAVENOUS | Status: AC
  Administered 2023-08-17: 10 mL via INTRAVENOUS
  Filled 2023-08-17: qty 10

## 2023-08-17 MED ORDER — HEPARIN SOD (PORK) LOCK FLUSH 100 UNIT/ML IV SOLN
500.0000 [IU] | Freq: Once | INTRAVENOUS | Status: AC
  Administered 2023-08-17: 500 [IU] via INTRAVENOUS
  Filled 2023-08-17: qty 5

## 2023-08-17 NOTE — Progress Notes (Signed)
 Pt here for follow up. Pt reports that she has Lipoma to left hip. Since last visit she has had bilateral hip surgery and heart surgery.

## 2023-08-17 NOTE — Assessment & Plan Note (Addendum)
 Continue  calcium  2000 mg daily. Amber Barnes

## 2023-08-17 NOTE — Progress Notes (Signed)
 Hematology/Oncology Progress note [phone] [phone]     CHIEF COMPLAINTS/REASON FOR VISIT:  Follow-up for stage I HER2 positive breast cancer  ASSESSMENT & PLAN:   Cancer Staging  Malignant neoplasm of upper-outer quadrant of right female breast [facility]) Staging form: Breast, AJCC 8th Edition - Pathologic stage from 03/03/2021: Stage IA (pT1b, pN0, cM0, G3, ER-, PR+, HER2+) - Signed by [name], MD on 03/03/2021   Malignant neoplasm of upper-outer quadrant of right female breast [facility]) #Right breast invasive carcinoma pT1b pN0 ER negative, PR weakly positive HER 2 positive.  Not on endocrine therapy due to unclear magnitude of benefit Patient does not tolerate adjuvant chemotherapy with Taxol  and trastuzumab  despite Taxol  dose reduction.-->Status post adjuvant radiation--> 07/27/2022 Finished maintenance Trastuzumab  Labs are reviewed and discussed with patient. She is doing well clinically.  Continue annual diagnostic mammogram next due June 2025  Hypocalcemia Continue  calcium  2000 mg daily. .  Neuropathy Pre-existing neuropathy in her fingertips bilaterally- secondary to cervical spine radiculopathy Improved, she is off gabapentin .    Port-A-Cath in place She is doing well clinically, about 2 years after finishing surgery/chemo/radiation.  Dicussed about option of port removal and she agrees. Will ask Dr. Lane to remove medi port   Left hip soft tissue edema likely attribute to the growth of tissue asymmetry.  Recommend patient to continue follow up with orthopaedic surgeon.   Follow up 6 months. All questions were answered. The patient knows to call the clinic with any problems, questions or concerns.  [name], MD, PhD [facility] Hematology Oncology 08/17/2023     HISTORY OF PRESENTING ILLNESS:   Amber Barnes is a  73 y.o.  female presents for follow up of right breast cancer.  Oncology History  Malignant neoplasm of upper-outer  quadrant of right female breast (HCC)  01/19/2021 Initial Diagnosis   Breast cancer in female  -12/30/2020, patient had a screening mammogram done which showed possible mass and asymmetry in the right breast. 01/08/2021, diagnostic mammogram of the right breast showed 2 adjacent irregular spiculated masses.  Larger 1 was 10 mm and a smaller mass measured 5 mm. 01/15/2021 stereotactic biopsy of the right breast smaller mass showed invasive carcinoma no special type, grade 2, DCIS present, LVI negative.Ultrasound guided biopsy of the larger mass showed invasive mammary carcinoma  02/17/2021, patient is status post lumpectomy and sentinel lymph node biopsy Pathology showed invasive mammary carcinoma, 2 separate foci, DCIS, negative sentinel lymph node biopsy 0/1, grade 3, or margins negative for invasive carcinoma.  ER -, PR + [1-10%], HER2 +  pT1b(m) pN0   Strong family history of cancer, Discussed again about genetic testing. patient adamantly declined.   03/03/2021 Cancer Staging   Staging form: Breast, AJCC 8th Edition - Pathologic stage from 03/03/2021: Stage IA (pT1b, pN0, cM0, G3, ER-, PR+, HER2+) - Signed by [name], MD on 03/03/2021 Stage prefix: Initial diagnosis Multigene prognostic tests performed: None Histologic grading system: 3 grade system   03/08/2021 Echocardiogram   echocardiogram showed LVEF 60-65%. Per patient, she has been seen by Dr. Tresia team and was cleared for proceeding with chemotherapy.    02/2021 Genetic Testing   Strong family history of cancer, Discussed again about genetic testing. patient adamantly declined.   03/23/2021 Procedure   Mediport by vascular surgeon   03/24/2021 -  Chemotherapy   03/24/2021 - 03/31/2021 Taxol  80 mg/m2 /trastuzumab  04/05/2021 - 04/09/2021 patient was admitted due to neutropenic fever with associated SIRS.  Patient was treated with empiric  cefepime  and vancomycin  until date of discharge when ANC recovered to 1.4. Post hospitalization,  patient also had COVID-19 infection so her follow-up appointment was further postponed for another 2 weeks.  05/10/2021 resumed dose reduce Taxol  65mg /m2 /trastuzumab  05/25/2021-06/30/2021, cycle 2 dose reduced Taxol  65mg /m2 /trastuzumab    admitted from 07/12/2021 - 07/17/2021 due to acute pyelonephritis UVJ obstruction and a right hydro ureteralnephrosis.  Patient was treated with IV antibiotics status post emergent bilateral ureteral stent placement.  Discharged home with a course of oral antibiotics.  Shared decision made to hold chemotherapy and proceed with radiation.    08/18/2021 - 09/08/2021 Radiation Therapy    adjuvant breast radiation   10/06/2021 -  Chemotherapy   10/06/2021, shared decision was made to proceed with maintenance trastuzumab .  Her cardiologist Dr. Gollan gave clearance to proceed. Plan Transtuzumab for up to a year if tolerates.    12/14/2021 Mammogram   Bilateral diagnostic mammogram 1. Probably benign right breast calcifications along the posterior lumpectomy bed felt to represent early changes of fat necrosis.Recommend short-term imaging follow-up. 2. No mammographic evidence of malignancy on the left.   03/02/2022 Echocardiogram   LVEF 60-65%, grade I diastolic dysfuction   05/31/2022 Echocardiogram   LVEF is 55 to 60%, Grade II diastolic dysfunction    10/20/2022 Echocardiogram   LVEF 45-50%   12/28/2022 Mammogram   Bilateral diagnostic mammogram showed There is an indeterminate 8 mm mass in the RIGHT upper inner breast  S/p US  guided biopsy of right breast lesion.  Pathology showed Fat necrosis, negative for malignancy.  Radiology feels this results is concordant.    07/07/2023 Mammogram   Unilateral diagnostic mammogram -right 1. No evidence of malignancy. 2. The previously described probably benign right breast calcifications are benign calcifications associated with fat necrosis and oil cyst formation.   HER2-positive carcinoma of breast (HCC)    Left  butt/thigh tenderness for a few weeks, ultrasound soft tissue left lower extremity negative.   INTERVAL HISTORY Amber Barnes is a 73 y.o. female who has above history reviewed by me today presents for follow up visit for management of right side HER 2 positive breast cancer Neuropathy has improved. Off Gabapentin .  S/p afib ablation, with pericarditis. Chest pain has improved. She follows up with cardiology.  No new breast concerns.  She has had left hip replacement. She has noticed left hip soft tissue growth.  MRI left hip did not show any mass at the area of concern. She has 2.3cm fluid collection along the anterior tensor fascia lata muscle.  Mild soft tissue edema in the subcutaneous fat overlying the left hip with skin thickening which may be secondary to lymphedema or cellulitis   Review of Systems  Constitutional:  Positive for fatigue. Negative for appetite change, chills, fever and unexpected weight change.  HENT:   Negative for hearing loss and voice change.   Respiratory:  Negative for chest tightness and cough.   Cardiovascular:  Negative for chest pain and leg swelling.  Gastrointestinal:  Negative for abdominal distention, abdominal pain, blood in stool, nausea and vomiting.  Endocrine: Negative for hot flashes.  Genitourinary:  Negative for difficulty urinating, frequency and hematuria.   Musculoskeletal:  Positive for arthralgias.  Skin:  Negative for itching and rash.  Neurological:  Positive for numbness. Negative for dizziness and extremity weakness.  Hematological:  Negative for adenopathy.  Psychiatric/Behavioral:  Negative for confusion.     MEDICAL HISTORY:  Past Medical History:  Diagnosis Date   Achilles tendinitis  Acute medial meniscal tear    Allergy    Anemia    Anxiety    a.) on BZO PRN (alprazolam )   Aortic atherosclerosis (HCC)    Ascending aorta dilatation (HCC) 10/20/2022   a.) TTE 10/20/2022: asc Ao 39 mm   Atrial fibrillation/flutter  (HCC) 07/2016   a.) Dx'd 07/2016; b.) CHA2DS2-VASc = 5 (age, sex, HFmrEF, HTN, vasc disease history) as of 05/09/2023; c.) s/p DCCV 09/07/2016 (150 J x 2 --> A.flutter; 200 J x 1 --> NSR); d.) s/p DCCV 10/19/2016 (150 J x 1 --> NSR); e.) brought in for DCCV 12/2016; cancelled d/t NSR; f.) s/p PVI ablation 12/12/2022; g.) cardiac rate/rhythm maintained on oral bisoprolol ; chronically anticoagulated using apixaban    BMI 50.0-59.9, adult (HCC) 07/16/2018   CAD (coronary artery disease)    a.) cCTA 12/08/2022: Ca2+ = 31.6 (58th %'ile for age/sex/race match control)   Chemotherapy induced nausea and vomiting 05/18/2021   Chronic heart failure with mid-range ejection fraction (HFmrEF) (HCC)    a.) TTE 03/08/21: EF 60-65, mild LVH, G1DD, mild LAE, triv MR; b.) TTE 07/29/21: EF 60-65, G1DD, mild LAE, triv MR; c.) TTE 11/26/21: EF 55-60, G1DD, mild LAE, mild MR; d.) TTE 02/25/22: EF 60-65, G1DD, mild LAE, mild MR; e.) TTE 05/31/22: EF 55-60, mild LVH, G2DD, mild-mod MR; f.) TTE 10/20/22: EF 45-50, mild LAE, mod red RVSF, mild-mod TR, Asc Ao 39; g.) TTE 12/13/22: EF 50-55, mild LVH, mild MAC   CKD (chronic kidney disease), stage II    Colon polyp    Complication of anesthesia    a.) emergence delirium; b.) hypoxia after hip replacement   COPD (chronic obstructive pulmonary disease) (HCC)    DDD (degenerative disc disease), lumbar    a.) s/p fusion   Depression    Endometriosis    Fibrocystic breast disease    GERD (gastroesophageal reflux disease)    History of stress test    a.) MV 07/29/2016: EF 55-65%. Small, mild defect  in mid anteroseptal, apical septal, and apical locations. No ischemia-->low risk; b.) MV 10/2022: mod sized partially rev apical ant, apical septal, and apical defect. EF 52%. No signific cor Ca2+; c.) cCTA 12/08/2022: Ca2+ = 31.6 (58th %'ile).   History of tobacco use    Hyperlipidemia    Hypertension    Hypothyroidism    a.) s/p partial thyroidectomy for nodule; pathology benign    Insomnia    Lipoma    Malignant neoplasm of upper-outer quadrant of right female breast (HCC) 03/03/2021   a.) stage 1A invasive mammary carcinoma (cT1b or T1c N0); ER (-), PR (+), HER2/neu (+); did not tolerate adjuvant chemotherapy (paclitacel + trastuzumab ) despite paclitaxel  dose reduction; s/p adjuvant XRT; cinished maintenance trastuzumab  07/27/2022; no adjuvant endocrine therapy as no clear benefit   Migraine    Nephrolithiasis    On apixaban  therapy    OSA (obstructive sleep apnea)    a.) non-compliant with nocturnal PAP therapy   Osteoarthritis    Osteopenia    Peptic ulcer disease    a.) H. pylori   Pericarditis 12/12/2022   a.) following A.fib ablation; resolved   Pneumonia    Port-A-Cath in place    RLS (restless legs syndrome)    a.) on ropinirole    Vitamin D  deficiency     SURGICAL HISTORY: Past Surgical History:  Procedure Laterality Date   ABDOMINAL HYSTERECTOMY     ovaries left   ACHILLES TENDON SURGERY     2012,01/2017   ATRIAL FIBRILLATION ABLATION N/A  12/12/2022   Procedure: ATRIAL FIBRILLATION ABLATION;  Surgeon: Cindie Ole DASEN, MD;  Location: [facility] INVASIVE CV LAB;  Service: Cardiovascular;  Laterality: N/A;   BREAST BIOPSY Right 01/15/2021   Affirm biopsy-X clip-INVASIVE MAMMARY CARCINOMA   BREAST BIOPSY Right 01/15/2021   u/s bx-venus clip INVASIVE MAMMARY CARCINOMA   BREAST BIOPSY     BREAST BIOPSY Right 01/04/2023   u/s bx ribbon clip path pending   BREAST BIOPSY Right 01/04/2023   US  RT BREAST BX W LOC DEV 1ST LESION IMG BX SPEC US  GUIDE 01/04/2023 [facility]   BREAST LUMPECTOMY,RADIO FREQ LOCALIZER,AXILLARY SENTINEL LYMPH NODE BIOPSY Right 02/17/2021   Procedure: BREAST LUMPECTOMY,RADIO FREQ LOCALIZER,AXILLARY SENTINEL LYMPH NODE BIOPSY;  Surgeon: Lane Shope, MD;  Location: [facility];  Service: General;  Laterality: Right;   CARDIOVERSION N/A 09/07/2016   Procedure: CARDIOVERSION;  Surgeon: Evalene JINNY Lunger, MD;  Location: [facility];   Service: Cardiovascular;  Laterality: N/A;   CARDIOVERSION N/A 10/19/2016   Procedure: CARDIOVERSION;  Surgeon: Evalene JINNY Lunger, MD;  Location: [facility];  Service: Cardiovascular;  Laterality: N/A;   CARDIOVERSION N/A 12/14/2016   Procedure: CARDIOVERSION;  Surgeon: Lunger Evalene JINNY, MD;  Location: [facility];  Service: Cardiovascular;  Laterality: N/A;   COLONOSCOPY     COLONOSCOPY WITH PROPOFOL  N/A 05/13/2020   Procedure: COLONOSCOPY WITH PROPOFOL ;  Surgeon: Toledo, Ladell POUR, MD;  Location: [facility];  Service: Gastroenterology;  Laterality: N/A;  ELIQUIS    CYSTOSCOPY WITH STENT PLACEMENT Bilateral 07/12/2021   Procedure: CYSTOSCOPY WITH STENT PLACEMENT;  Surgeon: Francisca Redell BROCKS, MD;  Location: [facility];  Service: Urology;  Laterality: Bilateral;   CYSTOSCOPY/URETEROSCOPY/HOLMIUM LASER/STENT PLACEMENT Bilateral 07/30/2021   Procedure: CYSTOSCOPY/URETEROSCOPY/HOLMIUM LASER/BILATERAL STENT REMOVAL;  Surgeon: Francisca Redell BROCKS, MD;  Location: [facility];  Service: Urology;  Laterality: Bilateral;   ESOPHAGOGASTRODUODENOSCOPY     ESOPHAGOGASTRODUODENOSCOPY (EGD) WITH PROPOFOL  N/A 05/13/2020   Procedure: ESOPHAGOGASTRODUODENOSCOPY (EGD) WITH PROPOFOL ;  Surgeon: Toledo, Ladell POUR, MD;  Location: [facility];  Service: Gastroenterology;  Laterality: N/A;   FOOT SURGERY Left    tendon   LUMBAR FUSION     PORTA CATH INSERTION N/A 03/23/2021   Procedure: PORTA CATH INSERTION;  Surgeon: Jama Cordella MATSU, MD;  Location: [facility] INVASIVE CV LAB;  Service: Cardiovascular;  Laterality: N/A;   PORTACATH PLACEMENT N/A 03/23/2021   Procedure: ATTEMPTED INSERTION PORT-A-CATH;  Surgeon: Lane Shope, MD;  Location: [facility];  Service: General;  Laterality: N/A;   THYROIDECTOMY  1990   half thyroid  lobectomy secondary to a benign nodule   TOTAL HIP ARTHROPLASTY Right 08/21/2018   Procedure: TOTAL HIP ARTHROPLASTY ANTERIOR APPROACH-RIGHT CELL SAVER REQUESTED;  Surgeon: Kathlynn Sharper, MD;  Location:  [facility];  Service: Orthopedics;  Laterality: Right;   TOTAL HIP ARTHROPLASTY Left 05/11/2023   Procedure: TOTAL HIP ARTHROPLASTY ANTERIOR APPROACH;  Surgeon: Lorelle Hussar, MD;  Location: [facility];  Service: Orthopedics;  Laterality: Left;    SOCIAL HISTORY: Social History   Socioeconomic History   Marital status: Married    Spouse name: jeffrey   Number of children: 1   Years of education: Not on file   Highest education level: 12th grade  Occupational History    Comment: disabled  Tobacco Use   Smoking status: Former    Current packs/day: 0.00    Average packs/day: 0.3 packs/day for 10.0 years (2.5 ttl pk-yrs)    Types: Cigarettes    Start date: 87    Quit date: 2005    Years since quitting: 20.1  Passive exposure: Past   Smokeless tobacco: Never   Tobacco comments:    smoked for 10 years, 1 pack every 2-3 days  Vaping Use   Vaping status: Never Used  Substance and Sexual Activity   Alcohol use: Yes    Alcohol/week: 1.0 standard drink of alcohol    Types: 1 Glasses of wine per week    Comment: margarita qd   Drug use: Yes    Types: Marijuana   Sexual activity: Not Currently  Other Topics Concern   Not on file  Social History Narrative   Moved to [place] from [place], originally from [place].    1 cup of caffeine  daily   Right handed   Lives with husband, step-daughter and child       Social Drivers of Health   Financial Resource Strain: Low Risk  (07/14/2023)   Overall Financial Resource Strain (CARDIA)    Difficulty of Paying Living Expenses: Not very hard  Food Insecurity: No Food Insecurity (07/14/2023)   Hunger Vital Sign    Worried About Running Out of Food in the Last Year: Never true    Ran Out of Food in the Last Year: Never true  Transportation Needs: No Transportation Needs (07/14/2023)   PRAPARE - Administrator, Civil Service (Medical): No    Lack of Transportation (Non-Medical): No  Physical Activity:  Insufficiently Active (07/14/2023)   Exercise Vital Sign    Days of Exercise per Week: 4 days    Minutes of Exercise per Session: 20 min  Stress: Stress Concern Present (07/14/2023)   [employer] of Occupational Health - Occupational Stress Questionnaire    Feeling of Stress : To some extent  Social Connections: Socially Integrated (07/14/2023)   Social Connection and Isolation Panel [NHANES]    Frequency of Communication with Friends and Family: More than three times a week    Frequency of Social Gatherings with Friends and Family: Three times a week    Attends Religious Services: More than 4 times per year    Active Member of Clubs or Organizations: Yes    Attends Banker Meetings: More than 4 times per year    Marital Status: Married  Catering Manager Violence: Not At Risk (05/11/2023)   Humiliation, Afraid, Rape, and Kick questionnaire    Fear of Current or Ex-Partner: No    Emotionally Abused: No    Physically Abused: No    Sexually Abused: No    FAMILY HISTORY: Family History  Problem Relation Age of Onset   Cancer Mother        leukemia   Cancer Father 30       colon   Heart disease Father    Heart attack Father 77       died from MI   Hyperlipidemia Father    Hypertension Father    Cancer Sister 85       ovarian   Depression Sister    Cancer Brother        CLL   Breast cancer Paternal Aunt        40's   Migraines Neg Hx    Thyroid  cancer Neg Hx     ALLERGIES:  is allergic to hydrocodone , prednisone , amiodarone , hydrocodone -acetaminophen , tapentadol, adhesive [tape], latex, oxycodone , tramadol , and wound dressing adhesive.  MEDICATIONS:  Current Outpatient Medications  Medication Sig Dispense Refill   acetaminophen  (TYLENOL ) 500 MG tablet Take 2 tablets (1,000 mg total) by mouth every 8 (eight) hours.  30 tablet 0   ALPRAZolam  (XANAX ) 0.25 MG tablet TAKE 1 TABLET BY MOUTH TWICE A DAY AS NEEDED FOR ANXIETY 60 tablet 2   bisoprolol  (ZEBETA ) 10 MG  tablet Take 1 tablet (10 mg total) by mouth 2 (two) times daily. 180 tablet 3   busPIRone  (BUSPAR ) 5 MG tablet Take 1 tablet (5 mg total) by mouth 2 (two) times daily.     Cholecalciferol  (VITAMIN D3) 5000 units CAPS Take 5,000 Units by mouth daily.     ELIQUIS  5 MG TABS tablet TAKE 1 TABLET BY MOUTH TWICE A DAY 180 tablet 1   escitalopram  (LEXAPRO ) 10 MG tablet Take 1 tablet (10 mg total) by mouth daily. 90 tablet 3   furosemide  (LASIX ) 20 MG tablet Take 1 tablet (20 mg total) by mouth daily. May take additional 20mg  PO prn for leg swelling 180 tablet 3   losartan -hydrochlorothiazide  (HYZAAR) 50-12.5 MG tablet Take 0.5 tablets by mouth daily as needed (for BP 140/80). 30 tablet 1   Multiple Vitamin (MULTIVITAMIN) tablet Take 1 tablet by mouth daily.     potassium chloride  SA (KLOR-CON  M) 20 MEQ tablet Take 1 tablet (20 mEq total) by mouth daily. (Patient taking differently: Take 20 mEq by mouth every morning. And if she takes additional Lasix  she will take another K+) 90 tablet 3   ranolazine  (RANEXA ) 500 MG 12 hr tablet TAKE 1 TABLET BY MOUTH TWICE A DAY 180 tablet 2   rOPINIRole  (REQUIP ) 0.5 MG tablet Take 1 tablet (0.5 mg total) by mouth at bedtime. 90 tablet 3   rOPINIRole  (REQUIP ) 3 MG tablet Take 1 tablet (3 mg total) by mouth at bedtime. 90 tablet 3   rosuvastatin  (CRESTOR ) 40 MG tablet TAKE 1 TABLET BY MOUTH EVERY DAY 90 tablet 3   nystatin  cream (MYCOSTATIN ) Apply 1 application. topically 2 (two) times daily. Prn abdomen (Patient not taking: Reported on 08/17/2023) 60 g 5   No current facility-administered medications for this visit.     PHYSICAL EXAMINATION: ECOG PERFORMANCE STATUS: 1 - Symptomatic but completely ambulatory Vitals:   08/17/23 1055  BP: 133/81  Pulse: (!) 55  Resp: 18  Temp: (!) 96.3 F (35.7 C)    Filed Weights   08/17/23 1055  Weight: 247 lb 12.8 oz (112.4 kg)     Physical Exam Constitutional:      General: She is not in acute distress.    Appearance:  She is obese.  HENT:     Head: Normocephalic and atraumatic.  Eyes:     General: No scleral icterus. Cardiovascular:     Rate and Rhythm: Normal rate and regular rhythm.     Heart sounds: Normal heart sounds.  Pulmonary:     Effort: Pulmonary effort is normal. No respiratory distress.     Breath sounds: No wheezing.  Abdominal:     General: Bowel sounds are normal. There is no distension.     Palpations: Abdomen is soft.  Musculoskeletal:        General: No deformity. Normal range of motion.     Cervical back: Normal range of motion and neck supple.     Comments: 1+ edema bilaterally in lower extremity    Skin:    General: Skin is warm and dry.     Findings: No erythema or rash.  Neurological:     Mental Status: She is alert and oriented to person, place, and time. Mental status is at baseline.  Psychiatric:  Mood and Affect: Mood normal.     . LABORATORY DATA:  I have reviewed the data as listed    Latest Ref Rng & Units 08/17/2023   10:37 AM 05/12/2023    5:17 AM 02/14/2023   10:18 AM  CBC  WBC 4.0 - 10.5 K/uL 4.2  13.5  4.9   Hemoglobin 12.0 - 15.0 g/dL 86.7  88.2  86.5   Hematocrit 36.0 - 46.0 % 39.4  33.6  40.0   Platelets 150 - 400 K/uL 152  130  153       Latest Ref Rng & Units 08/17/2023   10:37 AM 05/12/2023    5:17 AM 05/05/2023   10:03 AM  CMP  Glucose 70 - 99 mg/dL 91  873  86   BUN 8 - 23 mg/dL 18  24  16    Creatinine 0.44 - 1.00 mg/dL 9.03  8.85  8.99   Sodium 135 - 145 mmol/L 137  134  140   Potassium 3.5 - 5.1 mmol/L 4.3  4.8  4.5   Chloride 98 - 111 mmol/L 103  100  102   CO2 22 - 32 mmol/L 26  26  28    Calcium  8.9 - 10.3 mg/dL 8.6  7.8  9.0   Total Protein 6.5 - 8.1 g/dL 6.9     Total Bilirubin 0.0 - 1.2 mg/dL 0.5     Alkaline Phos 38 - 126 U/L 75     AST 15 - 41 U/L 19     ALT 0 - 44 U/L 13         RADIOGRAPHIC STUDIES: I have personally reviewed the radiological images as listed and agreed with the findings in the report. MR HIP  LEFT W WO CONTRAST Result Date: 08/07/2023 CLINICAL DATA:  Left hip replacement in October. No known injury, enlarging left hip mass. EXAM: MRI OF THE LEFT HIP WITHOUT AND WITH CONTRAST TECHNIQUE: Multiplanar, multisequence MR imaging was performed both before and after administration of intravenous contrast. CONTRAST:  10mL GADAVIST  GADOBUTROL  1 MMOL/ML IV SOLN COMPARISON:  CT abdomen/pelvis 07/12/2021 FINDINGS: Bones: Left total hip arthroplasty with susceptibility artifact resulting from the orthopedic hardware partially obscuring the adjacent soft tissue and osseous structures. Mild marrow edema in the left proximal femur likely postsurgical. No periarticular fluid collection or osteolysis. 2.3 x 1.2 x 5 cm fluid collection along the anterior margin of the tensor fascia lata muscle likely reflecting a small postoperative seroma deep to the incision site. No periosteal reaction or bone destruction. No aggressive osseous lesion. Normal sacrum and sacroiliac joints. No SI joint widening or erosive changes. Articular cartilage and labrum Articular cartilage:  No chondral defect. Labrum: Grossly intact, but evaluation is limited by lack of intraarticular fluid. Joint or bursal effusion Joint effusion:  No hip joint effusion.  No SI joint effusion. Bursae:  No bursal fluid. Muscles and tendons Flexors: Normal. Extensors: Normal. Abductors: Normal. Adductors: Normal. Gluteals: Normal. Hamstrings: Normal. Other findings No pelvic free fluid. No fluid collection or hematoma. No inguinal lymphadenopathy. No inguinal hernia. No soft tissue mass, fluid collection or hematoma overlying the left hip. Mild soft tissue edema in the subcutaneous fat overlying the left hip with skin thickening. IMPRESSION: 1. Left total hip arthroplasty with susceptibility artifact resulting from the orthopedic hardware partially obscuring the adjacent soft tissue and osseous structures. Mild marrow edema in the left proximal femur likely  postsurgical. No periarticular fluid collection or osteolysis. 2. A 2.3 x 1.2 x 5 cm fluid  collection along the anterior margin of the tensor fascia lata muscle likely reflecting a small postoperative seroma deep to the incision site. 3. Otherwise, no soft tissue mass, fluid collection or hematoma overlying the left hip. Mild soft tissue edema in the subcutaneous fat overlying the left hip with skin thickening which may be secondary to lymphedema or cellulitis Electronically Signed   By: Julaine Blanch M.D.   On: 08/07/2023 15:05

## 2023-08-17 NOTE — Telephone Encounter (Addendum)
 Order for port removal faxed to [facility] (Dr. Ofilia Benton).

## 2023-08-17 NOTE — Assessment & Plan Note (Signed)
 Pre-existing neuropathy in her fingertips bilaterally- secondary to cervical spine radiculopathy Improved, she is off gabapentin .

## 2023-08-17 NOTE — Assessment & Plan Note (Signed)
 She is doing well clinically, about 2 years after finishing surgery/chemo/radiation.  Dicussed about option of port removal and she agrees. Will ask Dr. Ofilia Benton to remove medi port

## 2023-08-18 LAB — CANCER ANTIGEN 27.29: CA 27.29: 14.4 U/mL (ref 0.0–38.6)

## 2023-08-19 LAB — CANCER ANTIGEN 15-3: CA 15-3: 17.6 U/mL (ref 0.0–25.0)

## 2023-08-20 ENCOUNTER — Other Ambulatory Visit: Payer: Self-pay | Admitting: Cardiovascular Disease

## 2023-08-21 ENCOUNTER — Other Ambulatory Visit: Payer: Self-pay | Admitting: Orthopedic Surgery

## 2023-08-21 NOTE — Telephone Encounter (Signed)
 Prescription refill request for Eliquis  received. Indication:afib Last office visit:2/25 Scr:0.96  2/25 Age: 73 Weight:112.4  kg  Prescription refilled

## 2023-08-23 DIAGNOSIS — M1712 Unilateral primary osteoarthritis, left knee: Secondary | ICD-10-CM | POA: Diagnosis not present

## 2023-08-25 ENCOUNTER — Other Ambulatory Visit: Payer: Self-pay

## 2023-08-25 ENCOUNTER — Observation Stay
Admission: EM | Admit: 2023-08-25 | Discharge: 2023-08-26 | Disposition: A | Discharge status: Home / Self Care | Payer: Medicare Other | Attending: Hospitalist | Admitting: Hospitalist

## 2023-08-25 ENCOUNTER — Emergency Department: Payer: Medicare Other

## 2023-08-25 ENCOUNTER — Observation Stay: Payer: Medicare Other

## 2023-08-25 DIAGNOSIS — J449 Chronic obstructive pulmonary disease, unspecified: Secondary | ICD-10-CM | POA: Diagnosis not present

## 2023-08-25 DIAGNOSIS — E039 Hypothyroidism, unspecified: Secondary | ICD-10-CM | POA: Diagnosis not present

## 2023-08-25 DIAGNOSIS — Z79899 Other long term (current) drug therapy: Secondary | ICD-10-CM | POA: Insufficient documentation

## 2023-08-25 DIAGNOSIS — F1090 Alcohol use, unspecified, uncomplicated: Secondary | ICD-10-CM | POA: Insufficient documentation

## 2023-08-25 DIAGNOSIS — Z91040 Latex allergy status: Secondary | ICD-10-CM | POA: Insufficient documentation

## 2023-08-25 DIAGNOSIS — Z853 Personal history of malignant neoplasm of breast: Secondary | ICD-10-CM | POA: Diagnosis not present

## 2023-08-25 DIAGNOSIS — I5032 Chronic diastolic (congestive) heart failure: Secondary | ICD-10-CM

## 2023-08-25 DIAGNOSIS — R519 Headache, unspecified: Secondary | ICD-10-CM | POA: Diagnosis not present

## 2023-08-25 DIAGNOSIS — I480 Paroxysmal atrial fibrillation: Secondary | ICD-10-CM | POA: Insufficient documentation

## 2023-08-25 DIAGNOSIS — R079 Chest pain, unspecified: Principal | ICD-10-CM | POA: Diagnosis present

## 2023-08-25 DIAGNOSIS — I10 Essential (primary) hypertension: Secondary | ICD-10-CM | POA: Insufficient documentation

## 2023-08-25 DIAGNOSIS — M1990 Unspecified osteoarthritis, unspecified site: Secondary | ICD-10-CM | POA: Diagnosis not present

## 2023-08-25 DIAGNOSIS — C50919 Malignant neoplasm of unspecified site of unspecified female breast: Secondary | ICD-10-CM | POA: Diagnosis present

## 2023-08-25 DIAGNOSIS — Z9221 Personal history of antineoplastic chemotherapy: Secondary | ICD-10-CM | POA: Diagnosis not present

## 2023-08-25 DIAGNOSIS — I2510 Atherosclerotic heart disease of native coronary artery without angina pectoris: Secondary | ICD-10-CM | POA: Diagnosis not present

## 2023-08-25 DIAGNOSIS — Z923 Personal history of irradiation: Secondary | ICD-10-CM | POA: Diagnosis not present

## 2023-08-25 DIAGNOSIS — I130 Hypertensive heart and chronic kidney disease with heart failure and stage 1 through stage 4 chronic kidney disease, or unspecified chronic kidney disease: Secondary | ICD-10-CM | POA: Diagnosis not present

## 2023-08-25 DIAGNOSIS — R29818 Other symptoms and signs involving the nervous system: Secondary | ICD-10-CM | POA: Diagnosis not present

## 2023-08-25 DIAGNOSIS — I517 Cardiomegaly: Secondary | ICD-10-CM | POA: Diagnosis not present

## 2023-08-25 DIAGNOSIS — I700 Atherosclerosis of aorta: Secondary | ICD-10-CM | POA: Diagnosis not present

## 2023-08-25 DIAGNOSIS — E785 Hyperlipidemia, unspecified: Secondary | ICD-10-CM | POA: Diagnosis not present

## 2023-08-25 DIAGNOSIS — F1290 Cannabis use, unspecified, uncomplicated: Secondary | ICD-10-CM | POA: Diagnosis not present

## 2023-08-25 DIAGNOSIS — Z7901 Long term (current) use of anticoagulants: Secondary | ICD-10-CM | POA: Diagnosis not present

## 2023-08-25 DIAGNOSIS — K219 Gastro-esophageal reflux disease without esophagitis: Secondary | ICD-10-CM | POA: Diagnosis not present

## 2023-08-25 DIAGNOSIS — R234 Changes in skin texture: Secondary | ICD-10-CM | POA: Diagnosis not present

## 2023-08-25 DIAGNOSIS — I672 Cerebral atherosclerosis: Secondary | ICD-10-CM | POA: Diagnosis not present

## 2023-08-25 DIAGNOSIS — C50411 Malignant neoplasm of upper-outer quadrant of right female breast: Secondary | ICD-10-CM | POA: Diagnosis not present

## 2023-08-25 DIAGNOSIS — I4891 Unspecified atrial fibrillation: Secondary | ICD-10-CM

## 2023-08-25 DIAGNOSIS — Z87891 Personal history of nicotine dependence: Secondary | ICD-10-CM | POA: Diagnosis not present

## 2023-08-25 DIAGNOSIS — M8580 Other specified disorders of bone density and structure, unspecified site: Secondary | ICD-10-CM | POA: Diagnosis not present

## 2023-08-25 DIAGNOSIS — G2581 Restless legs syndrome: Secondary | ICD-10-CM | POA: Diagnosis not present

## 2023-08-25 DIAGNOSIS — G4733 Obstructive sleep apnea (adult) (pediatric): Secondary | ICD-10-CM | POA: Diagnosis not present

## 2023-08-25 DIAGNOSIS — G629 Polyneuropathy, unspecified: Secondary | ICD-10-CM | POA: Diagnosis not present

## 2023-08-25 DIAGNOSIS — Z452 Encounter for adjustment and management of vascular access device: Secondary | ICD-10-CM | POA: Diagnosis not present

## 2023-08-25 DIAGNOSIS — Z96642 Presence of left artificial hip joint: Secondary | ICD-10-CM | POA: Diagnosis not present

## 2023-08-25 DIAGNOSIS — R0789 Other chest pain: Secondary | ICD-10-CM | POA: Diagnosis present

## 2023-08-25 DIAGNOSIS — Z860100 Personal history of colon polyps, unspecified: Secondary | ICD-10-CM | POA: Diagnosis not present

## 2023-08-25 LAB — CBC
HCT: 40.1 % (ref 36.0–46.0)
Hemoglobin: 13.4 g/dL (ref 12.0–15.0)
MCH: 30.9 pg (ref 26.0–34.0)
MCHC: 33.4 g/dL (ref 30.0–36.0)
MCV: 92.6 fL (ref 80.0–100.0)
Platelets: 154 10*3/uL (ref 150–400)
RBC: 4.33 MIL/uL (ref 3.87–5.11)
RDW: 13.5 % (ref 11.5–15.5)
WBC: 7.8 10*3/uL (ref 4.0–10.5)
nRBC: 0 % (ref 0.0–0.2)

## 2023-08-25 LAB — TROPONIN I (HIGH SENSITIVITY)
Troponin I (High Sensitivity): 3 ng/L (ref <18)
Troponin I (High Sensitivity): 3 ng/L (ref <18)

## 2023-08-25 LAB — BASIC METABOLIC PANEL
Anion gap: 11 (ref 5–15)
BUN: 15 mg/dL (ref 8–23)
CO2: 27 mmol/L (ref 22–32)
Calcium: 8.8 mg/dL — ABNORMAL LOW (ref 8.9–10.3)
Chloride: 101 mmol/L (ref 98–111)
Creatinine, Ser: 0.89 mg/dL (ref 0.44–1.00)
GFR, Estimated: >60 mL/min (ref >60)
Glucose, Bld: 94 mg/dL (ref 70–99)
Potassium: 4.1 mmol/L (ref 3.5–5.1)
Sodium: 139 mmol/L (ref 135–145)

## 2023-08-25 MED ORDER — RANOLAZINE ER 500 MG PO TB12
500.0000 mg | ORAL_TABLET | Freq: Two times a day (BID) | ORAL | Status: DC
  Administered 2023-08-25 – 2023-08-26 (×2): 500 mg via ORAL
  Filled 2023-08-25 (×2): qty 1

## 2023-08-25 MED ORDER — LOSARTAN POTASSIUM 50 MG PO TABS
50.0000 mg | ORAL_TABLET | Freq: Once | ORAL | Status: AC
  Administered 2023-08-25: 50 mg via ORAL
  Filled 2023-08-25: qty 1

## 2023-08-25 MED ORDER — FENTANYL CITRATE PF 50 MCG/ML IJ SOSY
50.0000 ug | PREFILLED_SYRINGE | Freq: Once | INTRAMUSCULAR | Status: AC
  Administered 2023-08-25: 50 ug via INTRAVENOUS
  Filled 2023-08-25: qty 1

## 2023-08-25 MED ORDER — BISOPROLOL FUMARATE 5 MG PO TABS
10.0000 mg | ORAL_TABLET | Freq: Two times a day (BID) | ORAL | Status: DC
  Administered 2023-08-25 – 2023-08-26 (×2): 10 mg via ORAL
  Filled 2023-08-25 (×2): qty 2

## 2023-08-25 MED ORDER — HYDROCHLOROTHIAZIDE 12.5 MG PO TABS
12.5000 mg | ORAL_TABLET | Freq: Once | ORAL | Status: AC
  Administered 2023-08-25: 12.5 mg via ORAL
  Filled 2023-08-25: qty 1

## 2023-08-25 MED ORDER — ONDANSETRON HCL 4 MG/2ML IJ SOLN: 4.0000 mg | Freq: Four times a day (QID) | INTRAMUSCULAR | Status: DC | PRN

## 2023-08-25 MED ORDER — ENOXAPARIN SODIUM 60 MG/0.6ML IJ SOSY: 0.5000 mg/kg | PREFILLED_SYRINGE | INTRAMUSCULAR | Status: DC

## 2023-08-25 MED ORDER — IOHEXOL 300 MG/ML  SOLN: 75.0000 mL | Freq: Once | INTRAMUSCULAR | Status: DC | PRN

## 2023-08-25 MED ORDER — NITROGLYCERIN 0.4 MG SL SUBL
0.4000 mg | SUBLINGUAL_TABLET | Freq: Once | SUBLINGUAL | Status: AC
  Administered 2023-08-25: 0.4 mg via SUBLINGUAL
  Filled 2023-08-25: qty 1

## 2023-08-25 MED ORDER — ACETAMINOPHEN 325 MG PO TABS: 650.0000 mg | ORAL_TABLET | ORAL | Status: DC | PRN

## 2023-08-25 MED ORDER — APIXABAN 5 MG PO TABS
5.0000 mg | ORAL_TABLET | Freq: Two times a day (BID) | ORAL | Status: DC
  Administered 2023-08-25 – 2023-08-26 (×2): 5 mg via ORAL
  Filled 2023-08-25 (×2): qty 1

## 2023-08-25 MED ORDER — ROSUVASTATIN CALCIUM 20 MG PO TABS
40.0000 mg | ORAL_TABLET | Freq: Every day | ORAL | Status: DC
  Administered 2023-08-25: 40 mg via ORAL
  Filled 2023-08-25: qty 2

## 2023-08-25 MED ORDER — HYDRALAZINE HCL 20 MG/ML IJ SOLN: 10.0000 mg | INTRAMUSCULAR | Status: DC | PRN

## 2023-08-25 MED ORDER — IOHEXOL 350 MG/ML SOLN
75.0000 mL | Freq: Once | INTRAVENOUS | Status: AC | PRN
  Administered 2023-08-25: 75 mL via INTRAVENOUS

## 2023-08-25 MED ORDER — LABETALOL HCL 5 MG/ML IV SOLN: 20.0000 mg | Freq: Once | INTRAVENOUS | Status: DC

## 2023-08-25 MED ORDER — OXYCODONE-ACETAMINOPHEN 7.5-325 MG PO TABS
1.0000 | ORAL_TABLET | Freq: Four times a day (QID) | ORAL | Status: DC | PRN
  Administered 2023-08-25 – 2023-08-26 (×2): 1 via ORAL
  Filled 2023-08-25 (×2): qty 1

## 2023-08-25 NOTE — Assessment & Plan Note (Signed)
Rate controlled  Cont eliquis, zebeta

## 2023-08-25 NOTE — ED Provider Notes (Addendum)
-----------------------------------------   4:52 PM on 08/25/2023 ----------------------------------------- Patient care assumed from prior provider.  I personally seen and evaluated the patient.  She continues to have central chest pain described as a 7/10.  Patient's workup shows a reassuring CBC, reassuring chemistry and a negative troponin x 2.  CTA of the chest is negative as well.  Patient has had multiple rounds of pain medication as well as nitroglycerin continues with chest pain we will admit to the hospital service for further workup and treatment.  Patient agreeable to plan.  HEART Score: 4      Minna Antis, MD 08/25/23 1718

## 2023-08-25 NOTE — Assessment & Plan Note (Signed)
Stable from a respiratory standpoint Continue home inhalers

## 2023-08-25 NOTE — Assessment & Plan Note (Signed)
PPI ?

## 2023-08-25 NOTE — ED Provider Notes (Signed)
[facility] Provider Note    Event Date/Time   First MD Initiated Contact with Patient 08/25/23 1101     (approximate)   History   Chest Pain   HPI  Amber Barnes is a 73 y.o. female with a prior history of smoking, atrial fibrillation, CAD, CHF, CKD, COPD who presents with complaints of painful inspiration chest discomfort which started this morning.  Yesterday she felt well.  No recent significant travel.  No calf pain or swelling.  She wonders if she has increased fluid, has taken an extra fluid pill but typically does not have discomfort in her chest when she has edema     Physical Exam   Triage Vital Signs: ED Triage Vitals  Encounter Vitals Group     BP 08/25/23 1032 (!) 182/101     Systolic BP Percentile --      Diastolic BP Percentile --      Pulse Rate 08/25/23 1032 60     Resp 08/25/23 1032 17     Temp 08/25/23 1032 98 F (36.7 C)     Temp Source 08/25/23 1032 Oral     SpO2 08/25/23 1032 96 %     Weight 08/25/23 1030 111.1 kg (245 lb)     Height 08/25/23 1030 1.651 m (5\' 5" )     Head Circumference --      Peak Flow --      Pain Score 08/25/23 1030 9     Pain Loc --      Pain Education --      Exclude from Growth Chart --     Most recent vital signs: Vitals:   08/26/23 0600 08/26/23 0806  BP: (!) 100/56 (!) 104/91  Pulse: 60 60  Resp: 19   Temp: 98.1 F (36.7 C) 97.7 F (36.5 C)  SpO2: 93% 97%     General: Awake, no distress.  CV:  Good peripheral perfusion.  Resp:  Normal effort.  Clear to auscultation bilaterally, no rash, no tenderness to palpation Abd:  No distention.  Other:  Mild edema in the lower extremities bilaterally   ED Results / Procedures / Treatments   Labs (all labs ordered are listed, but only abnormal results are displayed) Labs Reviewed  BASIC METABOLIC PANEL - Abnormal; Notable for the following components:      Result Value   Calcium 8.8 (*)    All other components within normal limits   CBC  HIV ANTIBODY (ROUTINE TESTING W REFLEX)  SEDIMENTATION RATE  C-REACTIVE PROTEIN  TROPONIN I (HIGH SENSITIVITY)  TROPONIN I (HIGH SENSITIVITY)     EKG  ED ECG REPORT I, Jene Every, the attending physician, personally viewed and interpreted this ECG.  Date: 08/25/2023  Rhythm: normal sinus rhythm QRS Axis: normal Intervals: normal ST/T Wave abnormalities: normal Narrative Interpretation: no evidence of acute ischemia    RADIOLOGY Chest x-ray viewed interpret by me, no acute abnormality    PROCEDURES:  Critical Care performed:   Procedures   MEDICATIONS ORDERED IN ED: Medications  hydrALAZINE (APRESOLINE) injection 10 mg (has no administration in time range)  ondansetron (ZOFRAN) injection 4 mg (has no administration in time range)  bisoprolol (ZEBETA) tablet 10 mg (10 mg Oral Given 08/25/23 2143)  apixaban (ELIQUIS) tablet 5 mg (5 mg Oral Given 08/25/23 2143)  ranolazine (RANEXA) 12 hr tablet 500 mg (500 mg Oral Given 08/25/23 2144)  rosuvastatin (CRESTOR) tablet 40 mg (40 mg Oral Given 08/25/23 2143)  acetaminophen (TYLENOL) tablet 1,000  mg (has no administration in time range)  fentaNYL (SUBLIMAZE) injection 50 mcg (50 mcg Intravenous Given 08/25/23 1313)  iohexol (OMNIPAQUE) 350 MG/ML injection 75 mL (75 mLs Intravenous Contrast Given 08/25/23 1244)  fentaNYL (SUBLIMAZE) injection 50 mcg (50 mcg Intravenous Given 08/25/23 1509)  losartan (COZAAR) tablet 50 mg (50 mg Oral Given 08/25/23 1557)  hydrochlorothiazide (HYDRODIURIL) tablet 12.5 mg (12.5 mg Oral Given 08/25/23 1557)  nitroGLYCERIN (NITROSTAT) SL tablet 0.4 mg (0.4 mg Sublingual Given 08/25/23 1557)     IMPRESSION / MDM / ASSESSMENT AND PLAN / ED COURSE  I reviewed the triage vital signs and the nursing notes. Patient's presentation is most consistent with acute presentation with potential threat to life or bodily function.  Patient presents with chest pain, pleurisy as detailed above, differential  includes ACS, musculoskeletal pain, PE, pulmonary edema  EKG, high sensitive troponin are reassuring, chest x-ray not consistent with pulmonary edema will send for CT angiography given pleuritic chest pain with abrupt onset  CT angiography negative for PE, patient with continued chest discomfort, will give sublingual nitroglycerin and additional pain medication.  Have asked my colleague to reevaluate to determine disposition        FINAL CLINICAL IMPRESSION(S) / ED DIAGNOSES   Final diagnoses:  Chest pain, unspecified type     Rx / DC Orders   ED Discharge Orders     None        Note:  This document was prepared using Dragon voice recognition software and may include unintentional dictation errors.   Jene Every, MD 08/26/23 [phone]

## 2023-08-25 NOTE — Progress Notes (Signed)
PHARMACIST - PHYSICIAN COMMUNICATION  CONCERNING:  Enoxaparin (Lovenox) for DVT Prophylaxis    RECOMMENDATION: Patient was prescribed enoxaprin 40mg  q24 hours for VTE prophylaxis.   Filed Weights   08/25/23 1030  Weight: 111.1 kg (245 lb)    Body mass index is 40.77 kg/m.  Estimated Creatinine Clearance: 73 mL/min (by C-G formula based on SCr of 0.89 mg/dL).  Based on [facility] policy patient is candidate for enoxaparin 0.5mg /kg TBW SQ every 24 hours based on BMI being >30.  DESCRIPTION: Pharmacy has adjusted enoxaparin dose per [facility] policy.  Patient is now receiving enoxaparin 55 mg every 24 hours    Rockwell Alexandria, PharmD Clinical Pharmacist  08/25/2023 5:38 PM

## 2023-08-25 NOTE — Assessment & Plan Note (Signed)
Blood pressure 140s to 180s over 100s Suspect element of symptomatic hypotension with presentation of chest pain Titrate BP regimen Monitor

## 2023-08-25 NOTE — Assessment & Plan Note (Signed)
Statin

## 2023-08-25 NOTE — H&P (Addendum)
History and Physical    Patient: Amber Barnes ZOX:[id number] DOB: [birth year] DOA: 08/25/2023 DOS: the patient was seen and examined on 08/25/2023 PCP: Allegra Grana, FNP  Patient coming from: Home  Chief Complaint:  Chief Complaint  Patient presents with   Chest Pain   HPI: Amber Barnes is a 73 y.o. female with medical history significant of atrial fibrillation s/p ablation, hypertension, chronic diastolic HF, hyperlipidemia, breast cancer s/p chemo/radiation/surgery, HTN, hypothyroidism presenting with chest pain.  Patient reports sudden onset of central chest pain since earlier this morning.  Chest pain in center of chest.  Minimal radiation.  Chest pain worse with breathing as well as deep movement.  No reported trauma or recent heavy lifting.  No associated nausea or diaphoresis.  Positive mild headache and vision changes.  Also patient reports blood pressure has been elevated into the 180s which is not baseline.  No focal hemiparesis or confusion.?  Transient episode of left lower extremity tingling.  Noted admission June 2024 for pericarditis. Presented to the ER afebrile, pressures 140s of 180s to 60s to 100s.  Satting well on room air.  White count 7.8, hemoglobin 13.4, platelets 154, troponin negative x 2, creatinine 0.9.  CTA of the chest negative for PE.  Mild cardiomegaly. EKG NSR.  Review of Systems: As mentioned in the history of present illness. All other systems reviewed and are negative. Past Medical History:  Diagnosis Date   Achilles tendinitis    Acute medial meniscal tear    Allergy    Anemia    Anxiety    a.) on BZO PRN (alprazolam)   Aortic atherosclerosis (HCC)    Ascending aorta dilatation (HCC) 10/20/2022   a.) TTE 10/20/2022: asc Ao 39 mm   Atrial fibrillation/flutter (HCC) 07/2016   a.) Dx'd 07/2016; b.) CHA2DS2-VASc = 5 (age, sex, HFmrEF, HTN, vasc disease history) as of 05/09/2023; c.) s/p DCCV 09/07/2016 (150 J x 2 --> A.flutter; 200 J x 1 -->  NSR); d.) s/p DCCV 10/19/2016 (150 J x 1 --> NSR); e.) brought in for DCCV 12/2016; cancelled d/t NSR; f.) s/p PVI ablation 12/12/2022; g.) cardiac rate/rhythm maintained on oral bisoprolol; chronically anticoagulated using apixaban   BMI 50.0-59.9, adult (HCC) 07/16/2018   CAD (coronary artery disease)    a.) cCTA 12/08/2022: Ca2+ = 31.6 (58th %'ile for age/sex/race match control)   Chemotherapy induced nausea and vomiting 05/18/2021   Chronic heart failure with mid-range ejection fraction (HFmrEF) (HCC)    a.) TTE 03/08/21: EF 60-65, mild LVH, G1DD, mild LAE, triv MR; b.) TTE 07/29/21: EF 60-65, G1DD, mild LAE, triv MR; c.) TTE 11/26/21: EF 55-60, G1DD, mild LAE, mild MR; d.) TTE 02/25/22: EF 60-65, G1DD, mild LAE, mild MR; e.) TTE 05/31/22: EF 55-60, mild LVH, G2DD, mild-mod MR; f.) TTE 10/20/22: EF 45-50, mild LAE, mod red RVSF, mild-mod TR, Asc Ao 39; g.) TTE 12/13/22: EF 50-55, mild LVH, mild MAC   CKD (chronic kidney disease), stage II    Colon polyp    Complication of anesthesia    a.) emergence delirium; b.) hypoxia after hip replacement   COPD (chronic obstructive pulmonary disease) (HCC)    DDD (degenerative disc disease), lumbar    a.) s/p fusion   Depression    Endometriosis    Fibrocystic breast disease    GERD (gastroesophageal reflux disease)    History of stress test    a.) MV 07/29/2016: EF 55-65%. Small, mild defect  in mid anteroseptal, apical septal, and apical locations. No  ischemia-->low risk; b.) MV 10/2022: mod sized partially rev apical ant, apical septal, and apical defect. EF 52%. No signific cor Ca2+; c.) cCTA 12/08/2022: Ca2+ = 31.6 (58th %'ile).   History of tobacco use    Hyperlipidemia    Hypertension    Hypothyroidism    a.) s/p partial thyroidectomy for nodule; pathology benign   Insomnia    Lipoma    Malignant neoplasm of upper-outer quadrant of right female breast (HCC) 03/03/2021   a.) stage 1A invasive mammary carcinoma (cT1b or T1c N0); ER (-), PR (+),  HER2/neu (+); did not tolerate adjuvant chemotherapy (paclitacel + trastuzumab) despite paclitaxel dose reduction; s/p adjuvant XRT; cinished maintenance trastuzumab 07/27/2022; no adjuvant endocrine therapy as no clear benefit   Migraine    Nephrolithiasis    On apixaban therapy    OSA (obstructive sleep apnea)    a.) non-compliant with nocturnal PAP therapy   Osteoarthritis    Osteopenia    Peptic ulcer disease    a.) H. pylori   Pericarditis 12/12/2022   a.) following A.fib ablation; resolved   Pneumonia    Port-A-Cath in place    RLS (restless legs syndrome)    a.) on ropinirole   Vitamin D deficiency    Past Surgical History:  Procedure Laterality Date   ABDOMINAL HYSTERECTOMY     ovaries left   ACHILLES TENDON SURGERY     2012,01/2017   ATRIAL FIBRILLATION ABLATION N/A 12/12/2022   Procedure: ATRIAL FIBRILLATION ABLATION;  Surgeon: Lanier Prude, MD;  Location: [facility];  Service: Cardiovascular;  Laterality: N/A;   BREAST BIOPSY Right 01/15/2021   Affirm biopsy-"X" clip-INVASIVE MAMMARY CARCINOMA   BREAST BIOPSY Right 01/15/2021   u/s bx-"venus" clip INVASIVE MAMMARY CARCINOMA   BREAST BIOPSY     BREAST BIOPSY Right 01/04/2023   u/s bx ribbon clip path pending   BREAST BIOPSY Right 01/04/2023   [place] RT BREAST BX W LOC DEV 1ST LESION IMG BX SPEC US GUIDE 01/04/2023 [facility]   BREAST LUMPECTOMY,RADIO FREQ LOCALIZER,AXILLARY SENTINEL LYMPH NODE BIOPSY Right 02/17/2021   Procedure: BREAST LUMPECTOMY,RADIO FREQ LOCALIZER,AXILLARY SENTINEL LYMPH NODE BIOPSY;  Surgeon: Campbell Lerner, MD;  Location: [facility];  Service: General;  Laterality: Right;   CARDIOVERSION N/A 09/07/2016   Procedure: CARDIOVERSION;  Surgeon: Antonieta Iba, MD;  Location: [facility];  Service: Cardiovascular;  Laterality: N/A;   CARDIOVERSION N/A 10/19/2016   Procedure: CARDIOVERSION;  Surgeon: Antonieta Iba, MD;  Location: [facility];  Service: Cardiovascular;  Laterality: N/A;    CARDIOVERSION N/A 12/14/2016   Procedure: CARDIOVERSION;  Surgeon: Antonieta Iba, MD;  Location: [facility];  Service: Cardiovascular;  Laterality: N/A;   COLONOSCOPY     COLONOSCOPY WITH PROPOFOL N/A 05/13/2020   Procedure: COLONOSCOPY WITH PROPOFOL;  Surgeon: Toledo, Boykin Nearing, MD;  Location: [facility];  Service: Gastroenterology;  Laterality: N/A;  ELIQUIS   CYSTOSCOPY WITH STENT PLACEMENT Bilateral 07/12/2021   Procedure: CYSTOSCOPY WITH STENT PLACEMENT;  Surgeon: Sondra Come, MD;  Location: [facility];  Service: Urology;  Laterality: Bilateral;   CYSTOSCOPY/URETEROSCOPY/HOLMIUM LASER/STENT PLACEMENT Bilateral 07/30/2021   Procedure: CYSTOSCOPY/URETEROSCOPY/HOLMIUM LASER/BILATERAL STENT REMOVAL;  Surgeon: Sondra Come, MD;  Location: [facility];  Service: Urology;  Laterality: Bilateral;   ESOPHAGOGASTRODUODENOSCOPY     ESOPHAGOGASTRODUODENOSCOPY (EGD) WITH PROPOFOL N/A 05/13/2020   Procedure: ESOPHAGOGASTRODUODENOSCOPY (EGD) WITH PROPOFOL;  Surgeon: Toledo, Boykin Nearing, MD;  Location: [facility];  Service: Gastroenterology;  Laterality: N/A;   FOOT SURGERY Left    tendon  LUMBAR FUSION     PORTA CATH INSERTION N/A 03/23/2021   Procedure: PORTA CATH INSERTION;  Surgeon: Renford Dills, MD;  Location: [facility];  Service: Cardiovascular;  Laterality: N/A;   PORTACATH PLACEMENT N/A 03/23/2021   Procedure: ATTEMPTED INSERTION PORT-A-CATH;  Surgeon: Campbell Lerner, MD;  Location: [facility];  Service: General;  Laterality: N/A;   THYROIDECTOMY  1990   half thyroid lobectomy secondary to a benign nodule   TOTAL HIP ARTHROPLASTY Right 08/21/2018   Procedure: TOTAL HIP ARTHROPLASTY ANTERIOR APPROACH-RIGHT CELL SAVER REQUESTED;  Surgeon: Kennedy Bucker, MD;  Location: [facility];  Service: Orthopedics;  Laterality: Right;   TOTAL HIP ARTHROPLASTY Left 05/11/2023   Procedure: TOTAL HIP ARTHROPLASTY ANTERIOR APPROACH;  Surgeon: Reinaldo Berber, MD;  Location: [facility];   Service: Orthopedics;  Laterality: Left;   Social History:  reports that she quit smoking about 20 years ago. Her smoking use included cigarettes. She started smoking about 30 years ago. She has a 2.5 pack-year smoking history. She has been exposed to tobacco smoke. She has never used smokeless tobacco. She reports current alcohol use of about 1.0 standard drink of alcohol per week. She reports current drug use. Drug: Marijuana.  Allergies  Allergen Reactions   Hydrocodone    Prednisone Other (See Comments)    Agitation, hyperactivity, polyphagia   Amiodarone Nausea And Vomiting and Other (See Comments)   Hydrocodone-Acetaminophen Other (See Comments)    Hallucinations and combative   Tapentadol Itching   Adhesive [Tape] Itching and Rash    Prefers paper tape   Latex Itching and Rash    use paper tap   Oxycodone Itching    Can take it with benadryl   Tramadol Itching    Can take it with benadryl   Wound Dressing Adhesive     Family History  Problem Relation Age of Onset   Cancer Mother        leukemia   Cancer Father 2       colon   Heart disease Father    Heart attack Father 77       died from MI   Hyperlipidemia Father    Hypertension Father    Cancer Sister 38       ovarian   Depression Sister    Cancer Brother        CLL   Breast cancer Paternal Aunt        40's   Migraines Neg Hx    Thyroid cancer Neg Hx     Prior to Admission medications   Medication Sig Start Date End Date Taking? Authorizing Provider  acetaminophen (TYLENOL) 500 MG tablet Take 2 tablets (1,000 mg total) by mouth every 8 (eight) hours. 05/12/23   Evon Slack, PA-C  ALPRAZolam Prudy Feeler) 0.25 MG tablet TAKE 1 TABLET BY MOUTH TWICE A DAY AS NEEDED FOR ANXIETY 08/08/23   Allegra Grana, FNP  bisoprolol (ZEBETA) 10 MG tablet Take 1 tablet (10 mg total) by mouth 2 (two) times daily. 02/07/23   Antonieta Iba, MD  busPIRone (BUSPAR) 5 MG tablet Take 1 tablet (5 mg total) by mouth 2 (two)  times daily. 07/14/23   Allegra Grana, FNP  Cholecalciferol (VITAMIN D3) 5000 units CAPS Take 5,000 Units by mouth daily.    [provider]  ELIQUIS 5 MG TABS tablet TAKE 1 TABLET BY MOUTH TWICE A DAY 08/21/23   Antonieta Iba, MD  escitalopram (LEXAPRO) 10 MG tablet Take 1 tablet (  10 mg total) by mouth daily. 07/14/23   Allegra Grana, FNP  furosemide (LASIX) 20 MG tablet Take 1 tablet (20 mg total) by mouth daily. May take additional 20mg  PO prn for leg swelling 03/14/23   Allegra Grana, FNP  losartan-hydrochlorothiazide (HYZAAR) 50-12.5 MG tablet Take 0.5 tablets by mouth daily as needed (for BP 140/80). 07/14/23   Allegra Grana, FNP  Multiple Vitamin (MULTIVITAMIN) tablet Take 1 tablet by mouth daily.    [provider]  nystatin cream (MYCOSTATIN) Apply 1 application. topically 2 (two) times daily. Prn abdomen Patient not taking: Reported on 08/17/2023 10/28/21   McLean-Scocuzza, Pasty Spillers, MD  potassium chloride SA (KLOR-CON M) 20 MEQ tablet Take 1 tablet (20 mEq total) by mouth daily. Patient taking differently: Take 20 mEq by mouth every morning. And if she takes additional Lasix she will take another K+ 11/08/22   Antonieta Iba, MD  ranolazine (RANEXA) 500 MG 12 hr tablet TAKE 1 TABLET BY MOUTH TWICE A DAY 03/14/23   Antonieta Iba, MD  rOPINIRole (REQUIP) 0.5 MG tablet Take 1 tablet (0.5 mg total) by mouth at bedtime. 06/16/23   Allegra Grana, FNP  rOPINIRole (REQUIP) 3 MG tablet Take 1 tablet (3 mg total) by mouth at bedtime. 06/16/23   Allegra Grana, FNP  rosuvastatin (CRESTOR) 40 MG tablet TAKE 1 TABLET BY MOUTH EVERY DAY 03/10/23   Allegra Grana, FNP  prochlorperazine (COMPAZINE) 10 MG tablet Take 1 tablet (10 mg total) by mouth every 6 (six) hours as needed (Nausea or vomiting). 03/19/21 03/02/22  [name], MD    Physical Exam: Vitals:   08/25/23 1032 08/25/23 1235 08/25/23 1515 08/25/23 1630  BP: (!) 182/101 (!) 183/100 (!) 188/83 (!)  148/68  Pulse: 60 66 71 69  Resp: 17 18  18   Temp: 98 F (36.7 C) 98.3 F (36.8 C)    TempSrc: Oral Oral    SpO2: 96% 97% 96% 98%  Weight:      Height:       Physical Exam Constitutional:      Appearance: She is obese.  HENT:     Head: Normocephalic and atraumatic.     Nose: Nose normal.     Mouth/Throat:     Mouth: Mucous membranes are moist.  Eyes:     Pupils: Pupils are equal, round, and reactive to light.  Cardiovascular:     Rate and Rhythm: Normal rate and regular rhythm.  Pulmonary:     Effort: Pulmonary effort is normal.  Abdominal:     General: Bowel sounds are normal.  Musculoskeletal:        General: Normal range of motion.     Comments: + moderate to severe anterior chest wall TTP    Skin:    General: Skin is warm.  Neurological:     General: No focal deficit present.  Psychiatric:        Mood and Affect: Mood normal.     Data Reviewed:  There are no new results to review at this time.  CT Angio Chest PE W and/or Wo Contrast CLINICAL DATA:  Pulmonary embolism (PE) suspected, high prob. History of breast cancer.  EXAM: CT ANGIOGRAPHY CHEST WITH CONTRAST  TECHNIQUE: Multidetector CT imaging of the chest was performed using the standard protocol during bolus administration of intravenous contrast. Multiplanar CT image reconstructions and MIPs were obtained to evaluate the vascular anatomy.  RADIATION DOSE REDUCTION: This exam was performed according to the  departmental dose-optimization program which includes automated exposure control, adjustment of the mA and/or kV according to patient size and/or use of iterative reconstruction technique.  CONTRAST:  75mL OMNIPAQUE IOHEXOL 350 MG/ML SOLN  COMPARISON:  CT chest dated 12/08/2022 and 03/03/2022.  FINDINGS: Cardiovascular: Satisfactory opacification of the pulmonary arteries to the segmental level. No evidence of pulmonary embolism. Heart is mildly enlarged. No pericardial effusion.  Scattered coronary artery calcifications. Thoracic aorta is normal in caliber with atherosclerotic calcification extending into the arch branch vessels.  Mediastinum/Nodes: No enlarged mediastinal, hilar, or axillary lymph nodes. Trachea and esophagus demonstrate no significant findings.  Lungs/Pleura: No focal consolidation, pleural effusion, or pneumothorax. Mild lingular and left basilar atelectasis/scarring. Similar faint ground-glass densities in the right mid lung and left apex, essentially unchanged since the prior exam dated 03/03/2022. No new or enlarging suspicious pulmonary nodule.  Upper Abdomen: No acute abnormality.  Musculoskeletal: No acute osseous abnormality. No suspicious osseous lesion.  Review of the MIP images confirms the above findings.  IMPRESSION: 1. No evidence of pulmonary embolism. 2. No acute intrathoracic findings. 3. Mild cardiomegaly.  Aortic Atherosclerosis (ICD10-I70.0).  Electronically Signed   By: Hart Robinsons M.D.   On: 08/25/2023 15:16 DG Chest 2 View CLINICAL DATA:  Central chest pain  EXAM: CHEST - 2 VIEW  COMPARISON:  Chest radiograph dated 12/13/2022  FINDINGS: Lines/tubes: Left chest wall port tip projects over the superior cavoatrial junction.  Lungs: Well inflated lungs. No focal consolidation.  Pleura: No pneumothorax or pleural effusion.  Heart/mediastinum: The heart size and mediastinal contours are within normal limits.  Bones: No acute osseous abnormality.  IMPRESSION: No active cardiopulmonary disease.  Electronically Signed   By: Agustin Cree M.D.   On: 08/25/2023 11:36  Lab Results  Component Value Date   WBC 7.8 08/25/2023   HGB 13.4 08/25/2023   HCT 40.1 08/25/2023   MCV 92.6 08/25/2023   PLT 154 08/25/2023   Last metabolic panel Lab Results  Component Value Date   GLUCOSE 94 08/25/2023   NA 139 08/25/2023   K 4.1 08/25/2023   CL 101 08/25/2023   CO2 27 08/25/2023   BUN 15 08/25/2023    CREATININE 0.89 08/25/2023   GFRNONAA >60 08/25/2023   CALCIUM 8.8 (L) 08/25/2023   PHOS 4.7 (H) 08/11/2016   PROT 6.9 08/17/2023   ALBUMIN 3.7 08/17/2023   LABGLOB 2.6 02/27/2018   AGRATIO 1.7 02/27/2018   BILITOT 0.5 08/17/2023   ALKPHOS 75 08/17/2023   AST 19 08/17/2023   ALT 13 08/17/2023   ANIONGAP 11 08/25/2023    Assessment and Plan: * Chest pain Recurrent reproducible central chest pain times 12-24 hours Pain worse with movement as well as deep breathing Moderate to severe tenderness to palpation on palpation of central chest concerning for costochondritis CT of the chest negative for PE Troponin negative x 2 EKG stable Will check 2d ECHO in setting of prior history pericarditis  Check baseline inflammatory markers  Pain control  Monitor  Cardiology consultation as clinically indicated  PAF (paroxysmal atrial fibrillation) (HCC) Rate controlled  Cont eliquis, zebeta   HTN (hypertension) Blood pressure 140s to 180s over 100s Suspect element of symptomatic hypotension with presentation of chest pain Titrate BP regimen Monitor  HLD (hyperlipidemia) Statin  COPD (chronic obstructive pulmonary disease) (HCC) Stable from a respiratory standpoint Continue home inhalers  GERD (gastroesophageal reflux disease) PPI  HER2-positive carcinoma of breast (HCC) doing well clinically, about 2 years after finishing surgery/chemo/radiation   OSA (obstructive sleep  apnea) CPAP    Greater than 50% was spent in counseling and coordination of care with patient Total encounter time 80 minutes or more   Advance Care Planning:   Code Status: Full Code   Consults: None   Family Communication: No family at the bedside   Severity of Illness: The appropriate patient status for this patient is OBSERVATION. Observation status is judged to be reasonable and necessary in order to provide the required intensity of service to ensure the patient's safety. The patient's presenting  symptoms, physical exam findings, and initial radiographic and laboratory data in the context of their medical condition is felt to place them at decreased risk for further clinical deterioration. Furthermore, it is anticipated that the patient will be medically stable for discharge from the hospital within 2 midnights of admission.   Author: Floydene Flock, MD 08/25/2023 5:46 PM  For on call review [email or web address].

## 2023-08-25 NOTE — ED Notes (Signed)
Pt in procedure

## 2023-08-25 NOTE — ED Notes (Signed)
Pt ambulated to and from restroom with cane, nadn

## 2023-08-25 NOTE — Assessment & Plan Note (Signed)
doing well clinically, about 2 years after finishing surgery/chemo/radiation

## 2023-08-25 NOTE — Assessment & Plan Note (Addendum)
Recurrent reproducible central chest pain times 12-24 hours Pain worse with movement as well as deep breathing Moderate to severe tenderness to palpation on palpation of central chest concerning for costochondritis CT of the chest negative for PE Troponin negative x 2 EKG stable Will check 2d ECHO in setting of prior history pericarditis  Check baseline inflammatory markers  Pain control  Monitor  Cardiology consultation as clinically indicated

## 2023-08-25 NOTE — Assessment & Plan Note (Signed)
CPAP.

## 2023-08-25 NOTE — ED Triage Notes (Signed)
Pt sts that she has been having chest pain since this AM. Pt sts that the pain is in the center of her chest that goes up into her neck. Pt denies any SOB of cough.

## 2023-08-26 ENCOUNTER — Observation Stay (HOSPITAL_BASED_OUTPATIENT_CLINIC_OR_DEPARTMENT_OTHER)
Admission: RE | Admit: 2023-08-26 | Discharge: 2023-08-26 | Disposition: A | Discharge status: Home / Self Care | Payer: Medicare Other | Source: Ambulatory Visit | Attending: Family Medicine | Admitting: Family Medicine

## 2023-08-26 DIAGNOSIS — R0789 Other chest pain: Secondary | ICD-10-CM

## 2023-08-26 DIAGNOSIS — R079 Chest pain, unspecified: Secondary | ICD-10-CM

## 2023-08-26 LAB — ECHOCARDIOGRAM COMPLETE
AR max vel: 1.84 cm2
AV Area VTI: 1.86 cm2
AV Area mean vel: 1.79 cm2
AV Mean grad: 5 mm[Hg]
AV Peak grad: 9.9 mm[Hg]
Ao pk vel: 1.57 m/s
Area-P 1/2: 3.91 cm2
Height: 65 in
S' Lateral: 3.5 cm
Weight: 3920 [oz_av]

## 2023-08-26 LAB — HIV ANTIBODY (ROUTINE TESTING W REFLEX): HIV Screen 4th Generation wRfx: NONREACTIVE

## 2023-08-26 LAB — C-REACTIVE PROTEIN: CRP: 13 mg/dL — ABNORMAL HIGH (ref <1.0)

## 2023-08-26 LAB — SEDIMENTATION RATE: Sed Rate: 44 mm/h — ABNORMAL HIGH (ref 0–30)

## 2023-08-26 MED ORDER — ACETAMINOPHEN 500 MG PO TABS: 1000.0000 mg | ORAL_TABLET | Freq: Three times a day (TID) | ORAL | Status: DC | PRN

## 2023-08-26 MED ORDER — KETOROLAC TROMETHAMINE 15 MG/ML IJ SOLN
15.0000 mg | Freq: Once | INTRAMUSCULAR | Status: AC
  Administered 2023-08-26: 15 mg via INTRAVENOUS
  Filled 2023-08-26: qty 1

## 2023-08-26 NOTE — ED Notes (Signed)
Wrote tp Dr Fran Lowes, pt is asking for benadryl to be given (ordered) and to get percocet.

## 2023-08-26 NOTE — Progress Notes (Signed)
*  PRELIMINARY RESULTS* Echocardiogram 2D Echocardiogram has been performed.  Amber Barnes 08/26/2023, 9:22 AM

## 2023-08-26 NOTE — Discharge Summary (Signed)
Physician Discharge Summary   Amber Barnes  female DOB: 18-Oct-1950  BOF:[id number]  PCP: Allegra Grana, FNP  Admit date: 08/25/2023 Discharge date: 08/26/2023  Admitted From: home Disposition:  home CODE STATUS: Full code   Hospital Course:  For full details, please see H&P, progress notes, consult notes and ancillary notes.  Briefly,  Amber Barnes is a 73 y.o. female with medical history significant of atrial fibrillation s/p ablation, hypertension, chronic diastolic HF, hyperlipidemia, breast cancer s/p chemo/radiation/surgery, HTN, hypothyroidism presenting with chest pain.   * Chest pain, likely 2/2 costochondritis ACS ruled out Recurrent reproducible central chest pain times 12-24 hours Pain worse with movement as well as deep breathing Moderate to severe tenderness to palpation on palpation of central chest concerning for costochondritis CT of the chest negative for PE Troponin negative x 2 EKG stable.  Echo wnl.   PAF (paroxysmal atrial fibrillation) (HCC) Rate controlled  Cont home eliquis, zebeta    HTN (hypertension) --cont home bisoprolol, lasix and hyzaar   HLD (hyperlipidemia) Statin   COPD (chronic obstructive pulmonary disease) (HCC) Stable from a respiratory standpoint   HER2-positive carcinoma of breast (HCC) doing well clinically, about 2 years after finishing surgery/chemo/radiation    OSA (obstructive sleep apnea) CPAP    Discharge Diagnoses:  Principal Problem:   Chest pain Active Problems:   PAF (paroxysmal atrial fibrillation) (HCC)   HTN (hypertension)   HLD (hyperlipidemia)   COPD (chronic obstructive pulmonary disease) (HCC)   GERD (gastroesophageal reflux disease)   OSA (obstructive sleep apnea)   HER2-positive carcinoma of breast (HCC)     Discharge Instructions:  Allergies as of 08/26/2023       Reactions   Hydrocodone    Prednisone Other (See Comments)   Agitation, hyperactivity, polyphagia   Amiodarone  Nausea And Vomiting, Other (See Comments)   Hydrocodone-acetaminophen Other (See Comments)   Hallucinations and combative   Tapentadol Itching   Adhesive [tape] Itching, Rash   Prefers paper tape   Latex Itching, Rash   use paper tap   Oxycodone Itching   Can take it with benadryl   Tramadol Itching   Can take it with benadryl   Wound Dressing Adhesive         Medication List     STOP taking these medications    nystatin cream Commonly known as: MYCOSTATIN       TAKE these medications    acetaminophen 500 MG tablet Commonly known as: TYLENOL Take 2 tablets (1,000 mg total) by mouth every 8 (eight) hours as needed. Home med. What changed:  when to take this reasons to take this additional instructions   ALPRAZolam 0.25 MG tablet Commonly known as: XANAX TAKE 1 TABLET BY MOUTH TWICE A DAY AS NEEDED FOR ANXIETY   bisoprolol 10 MG tablet Commonly known as: ZEBETA Take 1 tablet (10 mg total) by mouth 2 (two) times daily.   busPIRone 5 MG tablet Commonly known as: BUSPAR Take 1 tablet (5 mg total) by mouth 2 (two) times daily.   Eliquis 5 MG Tabs tablet Generic drug: apixaban TAKE 1 TABLET BY MOUTH TWICE A DAY   escitalopram 10 MG tablet Commonly known as: LEXAPRO Take 1 tablet (10 mg total) by mouth daily.   furosemide 20 MG tablet Commonly known as: LASIX Take 1 tablet (20 mg total) by mouth daily. May take additional 20mg  PO prn for leg swelling   losartan-hydrochlorothiazide 50-12.5 MG tablet Commonly known as: HYZAAR Take 0.5 tablets by mouth daily  as needed (for BP 140/80).   multivitamin tablet Take 1 tablet by mouth daily.   potassium chloride SA 20 MEQ tablet Commonly known as: KLOR-CON M Take 1 tablet (20 mEq total) by mouth daily. What changed:  when to take this additional instructions   ranolazine 500 MG 12 hr tablet Commonly known as: RANEXA TAKE 1 TABLET BY MOUTH TWICE A DAY   rOPINIRole 2 MG tablet Commonly known as:  REQUIP Take 2 mg by mouth at bedtime. What changed: Another medication with the same name was removed. Continue taking this medication, and follow the directions you see here.   rosuvastatin 40 MG tablet Commonly known as: CRESTOR TAKE 1 TABLET BY MOUTH EVERY DAY   Vitamin D3 125 MCG (5000 UT) Caps Take 5,000 Units by mouth daily.         Follow-up Information     Allegra Grana, FNP Follow up in 1 week(s).   Specialty: Family Medicine Contact information: [address] [place]ure[place]s [address] [place] [address] [phone]                 Allergies  Allergen Reactions   Hydrocodone    Prednisone Other (See Comments)    Agitation, hyperactivity, polyphagia   Amiodarone Nausea And Vomiting and Other (See Comments)   Hydrocodone-Acetaminophen Other (See Comments)    Hallucinations and combative   Tapentadol Itching   Adhesive [Tape] Itching and Rash    Prefers paper tape   Latex Itching and Rash    use paper tap   Oxycodone Itching    Can take it with benadryl   Tramadol Itching    Can take it with benadryl   Wound Dressing Adhesive      The results of significant diagnostics from this hospitalization (including imaging, microbiology, ancillary and laboratory) are listed below for reference.   Consultations:   Procedures/Studies: CT HEAD WO CONTRAST ( ) Result Date: 08/25/2023 CLINICAL DATA:  Neuro deficit, acute, stroke suspected. Atrial fibrillation. Headache and visual disturbance. EXAM: CT HEAD WITHOUT CONTRAST TECHNIQUE: Contiguous axial images were obtained from the base of the skull through the vertex without intravenous contrast. RADIATION DOSE REDUCTION: This exam was performed according to the departmental dose-optimization program which includes automated exposure control, adjustment of the mA and/or kV according to patient size and/or use of iterative reconstruction technique. COMPARISON:  04/05/2021 FINDINGS: Brain: The brain shows a normal  appearance without evidence of malformation, atrophy, old or acute small or large vessel infarction, mass lesion, hemorrhage, hydrocephalus or extra-axial collection. Vascular: There is atherosclerotic calcification of the major vessels at the base of the brain. Skull: Normal.  No traumatic finding.  No focal bone lesion. Sinuses/Orbits: Sinuses are clear. Orbits appear normal. Mastoids are clear. Other: None significant IMPRESSION: No acute CT finding. Normal appearance of the brain itself. Atherosclerotic calcification of the major vessels at the base of the brain. Electronically Signed   By: Paulina Fusi M.D.   On: 08/25/2023 19:48   CT Angio Chest PE W and/or Wo Contrast Result Date: 08/25/2023 CLINICAL DATA:  Pulmonary embolism (PE) suspected, high prob. History of breast cancer. EXAM: CT ANGIOGRAPHY CHEST WITH CONTRAST TECHNIQUE: Multidetector CT imaging of the chest was performed using the standard protocol during bolus administration of intravenous contrast. Multiplanar CT image reconstructions and MIPs were obtained to evaluate the vascular anatomy. RADIATION DOSE REDUCTION: This exam was performed according to the departmental dose-optimization program which includes automated exposure control, adjustment of the mA and/or kV according to patient  size and/or use of iterative reconstruction technique. CONTRAST:  75mL OMNIPAQUE IOHEXOL 350 MG/ML SOLN COMPARISON:  CT chest dated 12/08/2022 and 03/03/2022. FINDINGS: Cardiovascular: Satisfactory opacification of the pulmonary arteries to the segmental level. No evidence of pulmonary embolism. Heart is mildly enlarged. No pericardial effusion. Scattered coronary artery calcifications. Thoracic aorta is normal in caliber with atherosclerotic calcification extending into the arch branch vessels. Mediastinum/Nodes: No enlarged mediastinal, hilar, or axillary lymph nodes. Trachea and esophagus demonstrate no significant findings. Lungs/Pleura: No focal  consolidation, pleural effusion, or pneumothorax. Mild lingular and left basilar atelectasis/scarring. Similar faint ground-glass densities in the right mid lung and left apex, essentially unchanged since the prior exam dated 03/03/2022. No new or enlarging suspicious pulmonary nodule. Upper Abdomen: No acute abnormality. Musculoskeletal: No acute osseous abnormality. No suspicious osseous lesion. Review of the MIP images confirms the above findings. IMPRESSION: 1. No evidence of pulmonary embolism. 2. No acute intrathoracic findings. 3. Mild cardiomegaly. Aortic Atherosclerosis (ICD10-I70.0). Electronically Signed   By: Hart Robinsons M.D.   On: 08/25/2023 15:16   DG Chest 2 View Result Date: 08/25/2023 CLINICAL DATA:  Central chest pain EXAM: CHEST - 2 VIEW COMPARISON:  Chest radiograph dated 12/13/2022 FINDINGS: Lines/tubes: Left chest wall port tip projects over the superior cavoatrial junction. Lungs: Well inflated lungs. No focal consolidation. Pleura: No pneumothorax or pleural effusion. Heart/mediastinum: The heart size and mediastinal contours are within normal limits. Bones: No acute osseous abnormality. IMPRESSION: No active cardiopulmonary disease. Electronically Signed   By: Agustin Cree M.D.   On: 08/25/2023 11:36   MR HIP LEFT W WO CONTRAST Result Date: 08/07/2023 CLINICAL DATA:  Left hip replacement in October. No known injury, enlarging left hip mass. EXAM: MRI OF THE LEFT HIP WITHOUT AND WITH CONTRAST TECHNIQUE: Multiplanar, multisequence MR imaging was performed both before and after administration of intravenous contrast. CONTRAST:  10mL GADAVIST GADOBUTROL 1 MMOL/ML IV SOLN COMPARISON:  CT abdomen/pelvis 07/12/2021 FINDINGS: Bones: Left total hip arthroplasty with susceptibility artifact resulting from the orthopedic hardware partially obscuring the adjacent soft tissue and osseous structures. Mild marrow edema in the left proximal femur likely postsurgical. No periarticular fluid  collection or osteolysis. 2.3 x 1.2 x 5 cm fluid collection along the anterior margin of the tensor fascia lata muscle likely reflecting a small postoperative seroma deep to the incision site. No periosteal reaction or bone destruction. No aggressive osseous lesion. Normal sacrum and sacroiliac joints. No SI joint widening or erosive changes. Articular cartilage and labrum Articular cartilage:  No chondral defect. Labrum: Grossly intact, but evaluation is limited by lack of intraarticular fluid. Joint or bursal effusion Joint effusion:  No hip joint effusion.  No SI joint effusion. Bursae:  No bursal fluid. Muscles and tendons Flexors: Normal. Extensors: Normal. Abductors: Normal. Adductors: Normal. Gluteals: Normal. Hamstrings: Normal. Other findings No pelvic free fluid. No fluid collection or hematoma. No inguinal lymphadenopathy. No inguinal hernia. No soft tissue mass, fluid collection or hematoma overlying the left hip. Mild soft tissue edema in the subcutaneous fat overlying the left hip with skin thickening. IMPRESSION: 1. Left total hip arthroplasty with susceptibility artifact resulting from the orthopedic hardware partially obscuring the adjacent soft tissue and osseous structures. Mild marrow edema in the left proximal femur likely postsurgical. No periarticular fluid collection or osteolysis. 2. A 2.3 x 1.2 x 5 cm fluid collection along the anterior margin of the tensor fascia lata muscle likely reflecting a small postoperative seroma deep to the incision site. 3. Otherwise, no soft tissue mass,  fluid collection or hematoma overlying the left hip. Mild soft tissue edema in the subcutaneous fat overlying the left hip with skin thickening which may be secondary to lymphedema or cellulitis Electronically Signed   By: Elige Ko M.D.   On: 08/07/2023 15:05      Labs: BNP (last 3 results) Recent Labs    12/13/22 1025  BNP 76.9   Basic Metabolic Panel: Recent Labs  Lab 08/25/23 1038  NA 139   K 4.1  CL 101  CO2 27  GLUCOSE 94  BUN 15  CREATININE 0.89  CALCIUM 8.8*   Liver Function Tests: No results for input(s): "AST", "ALT", "ALKPHOS", "BILITOT", "PROT", "ALBUMIN" in the last 168 hours. No results for input(s): "LIPASE", "AMYLASE" in the last 168 hours. No results for input(s): "AMMONIA" in the last 168 hours. CBC: Recent Labs  Lab 08/25/23 1038  WBC 7.8  HGB 13.4  HCT 40.1  MCV 92.6  PLT 154   Cardiac Enzymes: No results for input(s): "CKTOTAL", "CKMB", "CKMBINDEX", "TROPONINI" in the last 168 hours. BNP: Invalid input(s): "POCBNP" CBG: No results for input(s): "GLUCAP" in the last 168 hours. D-Dimer No results for input(s): "DDIMER" in the last 72 hours. Hgb A1c No results for input(s): "HGBA1C" in the last 72 hours. Lipid Profile No results for input(s): "CHOL", "HDL", "LDLCALC", "TRIG", "CHOLHDL", "LDLDIRECT" in the last 72 hours. Thyroid function studies No results for input(s): "TSH", "T4TOTAL", "T3FREE", "THYROIDAB" in the last 72 hours.  Invalid input(s): "FREET3" Anemia work up No results for input(s): "VITAMINB12", "FOLATE", "FERRITIN", "TIBC", "IRON", "RETICCTPCT" in the last 72 hours. Urinalysis    Component Value Date/Time   COLORURINE YELLOW (A) 04/28/2023 1050   APPEARANCEUR CLEAR (A) 04/28/2023 1050   APPEARANCEUR Clear 10/10/2014 2137   LABSPEC 1.009 04/28/2023 1050   LABSPEC 1.006 10/10/2014 2137   PHURINE 5.0 04/28/2023 1050   GLUCOSEU NEGATIVE 04/28/2023 1050   GLUCOSEU Negative 10/10/2014 2137   HGBUR SMALL (A) 04/28/2023 1050   BILIRUBINUR NEGATIVE 04/28/2023 1050   BILIRUBINUR neg 05/17/2016 1408   BILIRUBINUR Negative 10/10/2014 2137   KETONESUR NEGATIVE 04/28/2023 1050   PROTEINUR NEGATIVE 04/28/2023 1050   UROBILINOGEN 0.2 05/17/2016 1408   NITRITE NEGATIVE 04/28/2023 1050   LEUKOCYTESUR SMALL (A) 04/28/2023 1050   LEUKOCYTESUR Negative 10/10/2014 2137   Sepsis Labs Recent Labs  Lab 08/25/23 1038  WBC 7.8    Microbiology No results found for this or any previous visit (from the past 240 hours).   Total time spend on discharging this patient, including the last patient exam, discussing the hospital stay, instructions for ongoing care as it relates to all pertinent caregivers, as well as preparing the medical discharge records, prescriptions, and/or referrals as applicable, is 40 minutes.    Darlin Priestly, MD  [facility] 08/26/2023, 10:04 AM

## 2023-08-27 ENCOUNTER — Encounter: Payer: Self-pay | Admitting: Family

## 2023-08-29 ENCOUNTER — Ambulatory Visit: Payer: Medicare Other | Admitting: Family

## 2023-08-29 ENCOUNTER — Encounter: Payer: Self-pay | Admitting: Family

## 2023-08-29 VITALS — BP 130/76 | HR 68 | Temp 97.6°F | Ht 65.0 in | Wt 248.6 lb

## 2023-08-29 DIAGNOSIS — R0789 Other chest pain: Secondary | ICD-10-CM

## 2023-08-29 NOTE — Assessment & Plan Note (Addendum)
 Reviewed hospitalization, medications reconciled.  Discussed musculoskeletal etiology including costochondritis versus GERD Chest pain reproducible on exam.  Pain is improved.  Advised trial of heat and/or ice across her chest wall.  Consider physical therapy.  Advised to start Nexium for the next few days to see if symptoms improve.

## 2023-08-29 NOTE — Progress Notes (Signed)
 Assessment & Plan:  Atypical chest pain Assessment & Plan: Reviewed hospitalization, medications reconciled.  Discussed musculoskeletal etiology including costochondritis versus GERD Chest pain reproducible on exam.  Pain is improved.  Advised trial of heat and/or ice across her chest wall.  Consider physical therapy.  Advised to start Nexium for the next few days to see if symptoms improve.      Return precautions given.   Risks, benefits, and alternatives of the medications and treatment plan prescribed today were discussed, and patient expressed understanding.   Education regarding symptom management and diagnosis given to patient on AVS either electronically or printed.  Return in about 3 months (around 11/26/2023).  Rennie Plowman, FNP  Subjective:    Patient ID: Amber Barnes, female    DOB: [birth year], 73 y.o.   MRN: [id number]  CC: Amber Barnes is a 73 y.o. female who presents today for follow up.   HPI: She is feeling better today, cp has improved. She describes as episodic, epigastric pain. Epigastric pain started after tomato soup.  She didn't take nexium after soup as she normally doses.  Tomato-based foods have historically triggered acid reflux.   Pain is not migratory. She endorses chronic upper back pain. No shoulder pain. Pain with reaching. She is doing chair yoga, no known injury.   Denies F, left arm numbness, N, V, chills, hematuria, epigastric burning.   She is able to take a deep breathe and can feel the pain. Pain is not worse with twisting movement. She is concerned for husband's health and questions if anxiety contributory.   She is moving around today, getting dressed, using cane, without CP.   She is compliant with Ranexa 500 mg twice daily as prescribed by Dr. Mariah Milling.   She remains compliant with  bisoprolol  mg twice daily,  Lasix 20 mg day , and Hyzaar as needed     Presented to the ED 08/25/23 for CP. Subsequent hospitalization   Troponin x 2 negative CRP 13 ( prior 5.4) Hgb 13.4   Given hydralazine, zofran injection.   CTA negative PE; heart is mildly enlarged. No pericardial effusion.  Given nitroglycerin with plans to be admitted for 7/10 CP Dr Lenard Lance  H/o pericarditis 12/2022   Successful atrial fibrillation ablation 12/2022 Dr Lalla Brothers  Echocardiogram with left ventricular ejection fraction 60 to 65%.  Normal pulmonary artery pressure.  Mild mitral valve regurgitation.  Last seen by cardiology 02/07/2019 for follow-up persistent atrial fibrillation, diastolic CHF Allergies: Hydrocodone, Prednisone, Amiodarone, Hydrocodone-acetaminophen, Tapentadol, Adhesive [tape], Latex, Oxycodone, Tramadol, and Wound dressing adhesive Current Outpatient Medications on File Prior to Visit  Medication Sig Dispense Refill   acetaminophen (TYLENOL) 500 MG tablet Take 2 tablets (1,000 mg total) by mouth every 8 (eight) hours as needed. Home med.     ALPRAZolam (XANAX) 0.25 MG tablet TAKE 1 TABLET BY MOUTH TWICE A DAY AS NEEDED FOR ANXIETY 60 tablet 2   bisoprolol (ZEBETA) 10 MG tablet Take 1 tablet (10 mg total) by mouth 2 (two) times daily. 180 tablet 3   busPIRone (BUSPAR) 5 MG tablet Take 1 tablet (5 mg total) by mouth 2 (two) times daily.     Cholecalciferol (VITAMIN D3) 5000 units CAPS Take 5,000 Units by mouth daily.     ELIQUIS 5 MG TABS tablet TAKE 1 TABLET BY MOUTH TWICE A DAY 180 tablet 1   escitalopram (LEXAPRO) 10 MG tablet Take 1 tablet (10 mg total) by mouth daily. 90 tablet 3   furosemide (LASIX) 20 MG  tablet Take 1 tablet (20 mg total) by mouth daily. May take additional 20mg  PO prn for leg swelling 180 tablet 3   losartan-hydrochlorothiazide (HYZAAR) 50-12.5 MG tablet Take 0.5 tablets by mouth daily as needed (for BP 140/80). 30 tablet 1   Multiple Vitamin (MULTIVITAMIN) tablet Take 1 tablet by mouth daily.     potassium chloride SA (KLOR-CON M) 20 MEQ tablet Take 1 tablet (20 mEq total) by mouth daily.  (Patient taking differently: Take 20 mEq by mouth every morning. And if she takes additional Lasix she will take another K+) 90 tablet 3   ranolazine (RANEXA) 500 MG 12 hr tablet TAKE 1 TABLET BY MOUTH TWICE A DAY 180 tablet 2   rOPINIRole (REQUIP) 2 MG tablet Take 2 mg by mouth at bedtime.     rosuvastatin (CRESTOR) 40 MG tablet TAKE 1 TABLET BY MOUTH EVERY DAY 90 tablet 3   [DISCONTINUED] prochlorperazine (COMPAZINE) 10 MG tablet Take 1 tablet (10 mg total) by mouth every 6 (six) hours as needed (Nausea or vomiting). 30 tablet 1   No current facility-administered medications on file prior to visit.    Review of Systems  Constitutional:  Negative for chills and fever.  Respiratory:  Negative for cough.   Cardiovascular:  Positive for chest pain. Negative for palpitations.  Gastrointestinal:  Negative for nausea and vomiting.      Objective:    BP 130/76   Pulse 68   Temp 97.6 F (36.4 C) (Oral)   Ht 5\' 5"  (1.651 m)   Wt 248 lb 9.6 oz (112.8 kg)   SpO2 98%   BMI 41.37 kg/m  BP Readings from Last 3 Encounters:  09/04/23 (!) 113/59  08/29/23 130/76  08/26/23 (!) 104/91   Wt Readings from Last 3 Encounters:  08/29/23 248 lb 9.6 oz (112.8 kg)  08/25/23 245 lb (111.1 kg)  08/17/23 247 lb 12.8 oz (112.4 kg)    Physical Exam Vitals reviewed.  Constitutional:      Appearance: She is well-developed.  Eyes:     Conjunctiva/sclera: Conjunctivae normal.  Cardiovascular:     Rate and Rhythm: Normal rate and regular rhythm.     Pulses: Normal pulses.     Heart sounds: Normal heart sounds.  Pulmonary:     Effort: Pulmonary effort is normal.     Breath sounds: Normal breath sounds. No wheezing, rhonchi or rales.  Chest:     Chest wall: Tenderness present.       Comments: Reproducible chest wall pain. Skin:    General: Skin is warm and dry.  Neurological:     Mental Status: She is alert.  Psychiatric:        Speech: Speech normal.        Behavior: Behavior normal.         Thought Content: Thought content normal.

## 2023-08-29 NOTE — Patient Instructions (Addendum)
You heat and ice across your chest for suspected musculoskeletal pain.  Also, consider acid reflux. You may restart nexium for the next few days to a couple of weeks to see if pain responds  Please let me know how symptoms are and if any new concerns  Ensure you schedule a follow up with Dr Mariah Milling.

## 2023-09-04 ENCOUNTER — Encounter
Admission: RE | Admit: 2023-09-04 | Discharge: 2023-09-04 | Disposition: A | Discharge status: Home / Self Care | Payer: Medicare Other | Source: Ambulatory Visit | Attending: Orthopedic Surgery

## 2023-09-04 ENCOUNTER — Encounter: Payer: Self-pay | Admitting: Orthopedic Surgery

## 2023-09-04 ENCOUNTER — Other Ambulatory Visit: Payer: Self-pay

## 2023-09-04 DIAGNOSIS — Z01812 Encounter for preprocedural laboratory examination: Secondary | ICD-10-CM | POA: Diagnosis not present

## 2023-09-04 DIAGNOSIS — M1712 Unilateral primary osteoarthritis, left knee: Secondary | ICD-10-CM | POA: Diagnosis present

## 2023-09-04 DIAGNOSIS — Z01818 Encounter for other preprocedural examination: Secondary | ICD-10-CM | POA: Diagnosis present

## 2023-09-04 LAB — URINALYSIS, ROUTINE W REFLEX MICROSCOPIC
Bilirubin Urine: NEGATIVE
Glucose, UA: NEGATIVE mg/dL
Ketones, ur: NEGATIVE mg/dL
Nitrite: NEGATIVE
Protein, ur: NEGATIVE mg/dL
Specific Gravity, Urine: 1.009 (ref 1.005–1.030)
pH: 5 (ref 5.0–8.0)

## 2023-09-04 LAB — SURGICAL PCR SCREEN
MRSA, PCR: NEGATIVE
Staphylococcus aureus: NEGATIVE

## 2023-09-04 SURGERY — Surgical Case
Anesthesia: *Unknown

## 2023-09-04 NOTE — Patient Instructions (Addendum)
 Your procedure is scheduled on: Monday, March 09/2023 Report to the Registration Desk on the 1st floor of [facility]. To find out your arrival time, please call [phone] between 1PM - 3PM on:  Friday, Sep 08, 2023 If your arrival time is 6:00 am, do not arrive before that time as the [facility] entrance doors do not open until 6:00 am.  REMEMBER: Instructions that are not followed completely may result in serious medical risk, up to and including death; or upon the discretion of your surgeon and anesthesiologist your surgery may need to be rescheduled.  Do not eat food after midnight the night before surgery.  No gum chewing or hard candies.  You may however, drink CLEAR liquids up to 2 hours before you are scheduled to arrive for your surgery. Do not drink anything within 2 hours of your scheduled arrival time.  Clear liquids include: - water  - apple juice without pulp - gatorade (not RED colors) - black coffee or tea (Do NOT add milk or creamers to the coffee or tea) Do NOT drink anything that is not on this list.   In addition, your doctor has ordered for you to drink the provided:  Ensure Pre-Surgery Clear Carbohydrate Drink   Drinking this carbohydrate drink up to two hours before surgery helps to reduce insulin resistance and improve patient outcomes. Please complete drinking 2 hours before scheduled arrival time.  One week prior to surgery: Stop Anti-inflammatories (NSAIDS) such as Advil, Aleve, Ibuprofen, Motrin, Naproxen, Naprosyn and Aspirin based products such as Excedrin, Goody's Powder, BC Powder. Stop ANY OVER THE COUNTER supplements until after surgery.  You may however, continue to take Tylenol if needed for pain up until the day of surgery.                                                                Eliquis (Apixaban) can be held for 3 days prior to surgery per NP, cardiology . Last dose will be Feb 27/2025 ,Thursday  Continue taking all of your  other prescription medications up until the day of surgery.  ON THE DAY OF SURGERY ONLY TAKE THESE MEDICATIONS WITH SIPS OF WATER:  ALPRAZolam (XANAX)  bisoprolol (ZEBETA) busPIRone (BUSPAR) escitalopram (LEXAPRO) ranolazine (RANEXA) rosuvastatin (CRESTOR)   No Alcohol for 24 hours before or after surgery.  No Smoking including e-cigarettes for 24 hours before surgery.  No chewable tobacco products for at least 6 hours before surgery.  No nicotine patches on the day of surgery.  Do not use any "recreational" drugs for at least a week (preferably 2 weeks) before your surgery.  Please be advised that the combination of cocaine and anesthesia may have negative outcomes, up to and including death. If you test positive for cocaine, your surgery will be cancelled.  On the morning of surgery brush your teeth with toothpaste and water, you may rinse your mouth with mouthwash if you wish. Do not swallow any toothpaste or mouthwash.  Use CHG Soap or wipes as directed on instruction sheet.-   provided for you   Do not wear jewelry, make-up, hairpins, clips or nail polish.  For welded (permanent) jewelry: bracelets, anklets, waist bands, etc.  Please have this removed prior to surgery.  If it is not  removed, there is a chance that hospital personnel will need to cut it off on the day of surgery.  Do not wear lotions, powders, or perfumes.   Do not shave body hair from the neck down 48 hours before surgery.  Contact lenses, hearing aids and dentures may not be worn into surgery.  Do not bring valuables to the hospital. [facility] is not responsible for any missing/lost belongings or valuables.    Notify your doctor if there is any change in your medical condition (cold, fever, infection).  Wear comfortable clothing (specific to your surgery type) to the hospital.  After surgery, you can help prevent lung complications by doing breathing exercises.  Take deep breaths and cough every  1-2 hours. Your doctor may order a device called an Incentive Spirometer to help you take deep breaths.  If you are being admitted to the hospital overnight, leave your suitcase in the car. After surgery it may be brought to your room.   If you are being discharged the day of surgery, you will not be allowed to drive home. You will need a responsible individual to drive you home and stay with you for 24 hours after surgery.    Please call the Pre-admissions Testing Dept. at [phone] if you have any questions about these instructions.  Surgery Visitation Policy:  Patients having surgery or a procedure may have two visitors.  Children under the age of 64 must have an adult with them who is not the patient.  Temporary Visitor Restrictions Due to increasing cases of flu, RSV and COVID-19: Children ages 48 and under will not be able to visit patients in [facility] hospitals under most circumstances.  Inpatient Visitation:    Visiting hours are 7 a.m. to 8 p.m. Up to four visitors are allowed at one time in a patient room. The visitors may rotate out with other people during the day.  One visitor age 48 or older may stay with the patient overnight and must be in the room by 8 p.m.      Pre-operative 5 CHG Bath Instructions   You can play a key role in reducing the risk of infection after surgery. Your skin needs to be as free of germs as possible. You can reduce the number of germs on your skin by washing with CHG (chlorhexidine gluconate) soap before surgery. CHG is an antiseptic soap that kills germs and continues to kill germs even after washing.   DO NOT use if you have an allergy to chlorhexidine/CHG or antibacterial soaps. If your skin becomes reddened or irritated, stop using the CHG and notify one of our RNs at [phone].   Please shower with the CHG soap starting 4 days before surgery using the following schedule:         Please keep in mind the following:   DO NOT shave, including legs and underarms, starting the day of your first shower.   You may shave your face at any point before/day of surgery.  Place clean sheets on your bed the day you start using CHG soap. Use a clean washcloth (not used since being washed) for each shower. DO NOT sleep with pets once you start using the CHG.   CHG Shower Instructions:  If you choose to wash your hair and private area, wash first with your normal shampoo/soap.  After you use shampoo/soap, rinse your hair and body thoroughly to remove shampoo/soap residue.  Turn the water OFF and apply about 3  tablespoons (45 ml) of CHG soap to a CLEAN washcloth.  Apply CHG soap ONLY FROM YOUR NECK DOWN TO YOUR TOES (washing for 3-5 minutes)  DO NOT use CHG soap on face, private areas, open wounds, or sores.  Pay special attention to the area where your surgery is being performed.  If you are having back surgery, having someone wash your back for you may be helpful. Wait 2 minutes after CHG soap is applied, then you may rinse off the CHG soap.  Pat dry with a clean towel  Put on clean clothes/pajamas   If you choose to wear lotion, please use ONLY the CHG-compatible lotions on the back of this paper.     Additional instructions for the day of surgery: DO NOT APPLY any lotions, deodorants, cologne, or perfumes.   Put on clean/comfortable clothes.  Brush your teeth.  Ask your nurse before applying any prescription medications to the skin.      CHG Compatible Lotions   Aveeno Moisturizing lotion  Cetaphil Moisturizing Cream  Cetaphil Moisturizing Lotion  Clairol Herbal Essence Moisturizing Lotion, Dry Skin  Clairol Herbal Essence Moisturizing Lotion, Extra Dry Skin  Clairol Herbal Essence Moisturizing Lotion, Normal Skin  Curel Age Defying Therapeutic Moisturizing Lotion with Alpha Hydroxy  Curel Extreme Care Body Lotion  Curel Soothing Hands Moisturizing Hand Lotion  Curel Therapeutic Moisturizing Cream,  Fragrance-Free  Curel Therapeutic Moisturizing Lotion, Fragrance-Free  Curel Therapeutic Moisturizing Lotion, Original Formula  Eucerin Daily Replenishing Lotion  Eucerin Dry Skin Therapy Plus Alpha Hydroxy Crme  Eucerin Dry Skin Therapy Plus Alpha Hydroxy Lotion  Eucerin Original Crme  Eucerin Original Lotion  Eucerin Plus Crme Eucerin Plus Lotion  Eucerin TriLipid Replenishing Lotion  Keri Anti-Bacterial Hand Lotion  Keri Deep Conditioning Original Lotion Dry Skin Formula Softly Scented  Keri Deep Conditioning Original Lotion, Fragrance Free Sensitive Skin Formula  Keri Lotion Fast Absorbing Fragrance Free Sensitive Skin Formula  Keri Lotion Fast Absorbing Softly Scented Dry Skin Formula  Keri Original Lotion  Keri Skin Renewal Lotion Keri Silky Smooth Lotion  Keri Silky Smooth Sensitive Skin Lotion  Nivea Body Creamy Conditioning Oil  Nivea Body Extra Enriched Lotion  Nivea Body Original Lotion  Nivea Body Sheer Moisturizing Lotion Nivea Crme  Nivea Skin Firming Lotion  NutraDerm 30 Skin Lotion  NutraDerm Skin Lotion  NutraDerm Therapeutic Skin Cream  NutraDerm Therapeutic Skin Lotion  ProShield Protective Hand Cream  Provon moisturizing lotion     Preoperative Educational Videos for Total Hip, Knee and Shoulder Replacements  To better prepare for surgery, please view our videos that explain the physical activity and discharge planning required to have the best surgical recovery at [facility].  [email or web address]  Questions? Call [phone] or email [email or web address]      How to Use an Incentive Spirometer  An incentive spirometer is a tool that measures how well you are filling your lungs with each breath. Learning to take long, deep breaths using this tool can help you keep your lungs clear and active. This may help to reverse or lessen your chance of developing  breathing (pulmonary) problems, especially infection. You may be asked to use a spirometer: After a surgery. If you have a lung problem or a history of smoking. After a long period of time when you have been unable to move or be active. If the spirometer includes an indicator to show the highest number that you have reached, your health care provider or respiratory therapist will help you set a  goal. Keep a log of your progress as told by your health care provider. What are the risks? Breathing too quickly may cause dizziness or cause you to pass out. Take your time so you do not get dizzy or light-headed. If you are in pain, you may need to take pain medicine before doing incentive spirometry. It is harder to take a deep breath if you are having pain. How to use your incentive spirometer  Sit up on the edge of your bed or on a chair. Hold the incentive spirometer so that it is in an upright position. Before you use the spirometer, breathe out normally. Place the mouthpiece in your mouth. Make sure your lips are closed tightly around it. Breathe in slowly and as deeply as you can through your mouth, causing the piston or the ball to rise toward the top of the chamber. Hold your breath for 3-5 seconds, or for as long as possible. If the spirometer includes a coach indicator, use this to guide you in breathing. Slow down your breathing if the indicator goes above the marked areas. Remove the mouthpiece from your mouth and breathe out normally. The piston or ball will return to the bottom of the chamber. Rest for a few seconds, then repeat the steps 10 or more times. Take your time and take a few normal breaths between deep breaths so that you do not get dizzy or light-headed. Do this every 1-2 hours when you are awake. If the spirometer includes a goal marker to show the highest number you have reached (best effort), use this as a goal to work toward during each repetition. After each set of 10  deep breaths, cough a few times. This will help to make sure that your lungs are clear. If you have an incision on your chest or abdomen from surgery, place a pillow or a rolled-up towel firmly against the incision when you cough. This can help to reduce pain while taking deep breaths and coughing. General tips When you are able to get out of bed: Walk around often. Continue to take deep breaths and cough in order to clear your lungs. Keep using the incentive spirometer until your health care provider says it is okay to stop using it. If you have been in the hospital, you may be told to keep using the spirometer at home. Contact a health care provider if: You are having difficulty using the spirometer. You have trouble using the spirometer as often as instructed. Your pain medicine is not giving enough relief for you to use the spirometer as told. You have a fever. Get help right away if: You develop shortness of breath. You develop a cough with bloody mucus from the lungs. You have fluid or blood coming from an incision site after you cough. Summary An incentive spirometer is a tool that can help you learn to take long, deep breaths to keep your lungs clear and active. You may be asked to use a spirometer after a surgery, if you have a lung problem or a history of smoking, or if you have been inactive for a long period of time. Use your incentive spirometer as instructed every 1-2 hours while you are awake. If you have an incision on your chest or abdomen, place a pillow or a rolled-up towel firmly against your incision when you cough. This will help to reduce pain. Get help right away if you have shortness of breath, you cough up bloody mucus, or blood comes  from your incision when you cough. This information is not intended to replace advice given to you by your health care provider. Make sure you discuss any questions you have with your health care provider. Document Revised: 09/16/2019  Document Reviewed: 09/16/2019 Elsevier Patient Education  2023 [employer].

## 2023-09-07 NOTE — Telephone Encounter (Signed)
Error

## 2023-09-08 ENCOUNTER — Encounter: Payer: Self-pay | Admitting: Orthopedic Surgery

## 2023-09-08 NOTE — Progress Notes (Signed)
 Perioperative / Anesthesia Services  Pre-Admission Testing Clinical Review / Pre-Operative Anesthesia Consult  Date: 09/08/23  Patient Demographics:  Name: Amber Barnes DOB:   [birth year] MRN:   [id number]  Planned Surgical Procedure(s):    Case: [id number] Date/Time: 09/11/23 0959   Procedure: TOTAL KNEE ARTHROPLASTY (Left: Knee)   Anesthesia type: Choice   Pre-op diagnosis:      Primary osteoarthritis of left knee M17.12     Left knee pain, unspecified chronicity M25.562   Location: [facility] OR ROOM 02 / [facility]   Surgeons: Reinaldo Berber, MD     NOTE: Available PAT nursing documentation and vital signs have been reviewed. Clinical nursing staff has updated patient's PMH/PSHx, current medication list, and drug allergies/intolerances to ensure comprehensive history available to assist in medical decision making as it pertains to the aforementioned surgical procedure and anticipated anesthetic course. Extensive review of available clinical information personally performed. [place] [facility] and PSHx updated with any diagnoses/procedures that  may have been inadvertently omitted during her intake with the pre-admission testing department's nursing staff.  Clinical Discussion:  Amber Barnes is a 73 y.o. female who is submitted for pre-surgical anesthesia review and clearance prior to her undergoing the above procedure. Patient is a Former Smoker (2.5 pack years; quit 07/2003). Pertinent PMH includes: atrial fibrillation/flutter (s/p ablation), HFmrEF, aortic atherosclerosis, ascending aorta dilatation, cardiomegaly, HTN, HLD, hypothyroidism (s/p partial thyroidectomy), COPD, OSAH (non-compliant with nocturnal PAP therapy), GERD (no daily Tx), CKD-II, PUD, anemia, breast cancer, OA, chronic lower back pain, nephrolithiasis, depression, anxiety (on BZO), insomnia, RLS.   Patient is followed by cardiology Amber Kindle, MD). She was last seen in the cardiology clinic on  03/15/2023; notes reviewed. At the time of her clinic visit, patient doing well overall from a cardiovascular perspective. Patient denied any chest pain, shortness of breath, PND, orthopnea, palpitations, significant peripheral edema, weakness, fatigue, vertiginous symptoms, or presyncope/syncope. Patient with a past medical history significant for cardiovascular diagnoses. Documented physical exam was grossly benign, providing no evidence of acute exacerbation and/or decompensation of the patient's known cardiovascular conditions.  Patient diagnosed with atrial fibrillation in January 2018.   Patient underwent DCCV procedure on 09/07/2016, at which time she received multiple synchronized cardioversions.  150 J x 2 converted patient to atrial flutter.  Subsequent 200 J x 1 cardioversion restored NSR..  Due to recurrent atrial arrhythmia, patient underwent second DCCV procedure on 10/19/2016, at which time she received a single 150 J cardioversion, which restored NSR.  Again, due to recurrent atrial arrhythmia, patient was brought in for a third DCCV procedure in June 2018.  Upon arrival, patient was noted to be in NSR, therefore procedure was aborted.  Patient ultimately underwent PVI ablation procedure on 12/12/2022.  Since procedure, patient has had no recurrence of her atrial arrhythmia.  Most recent complete TTE was performed on 10/20/2022 revealing a mildly reduced left ventricular systolic function with an EF of 45-50%.  There was global hypokinesis.  Left ventricular diastolic Doppler parameters indeterminate.  GLS -8.0%.  Right ventricular size was normal with moderately reduced systolic function.  RVSP 28.0 mmHg.  Left atrium mildly dilated.  There was mild to moderate mitral and tricuspid valve regurgitation.  All transvalvular gradients were noted to be normal providing no evidence suggestive of valvular stenosis.  Ascending aorta borderline dilated at 39 mm.  Most recent myocardial  perfusion imaging study performed on 11/01/2022 revealed a mildly reduced left ventricular systolic function with an EF of 52%.  There was  subtle apical anterior and apical septal hypokinesis noted with stress.  Maximum heart rate 99 bpm, which was 63% of her MPHR.  There was a moderate in size, moderate to severe in severity, partially reversible defect involving the apical anterior, apical septal, and apical segments.  Most consistent with ischemia and possible element of scar, however artifact cannot be completely ruled out.  CT attenuation correction images showed no significant coronary artery calcification, though aortic atherosclerosis, aortic valve, and mitral annular calcifications were noted.  Study was abnormal and determined to be intermediate risk.  Coronary CTA was performed on 12/08/2022 that demonstrated an Agatston coronary artery calcium score of 31.6. This placed patient in the 91 percentile for age, sex, and race matched controls patient with a windsock cactus type LAA with an area of 47 mm.  There was no LAA thrombus noted.  No significant extracardiac findings demonstrated.  Follow-up limited TTE status post cardiac ablation performed on 12/13/2022.  EF had improved to 50-55%.  There were no regional wall motion abnormalities.  Mild LVH noted.  There was moderate mitral annular calcification.  Again, patient with a known diagnosis of atrial fibrillation; CHA2DS2-VASc Score = 5 (age, sex, HFmrEF, HTN, vascular disease history).  Cardiac rate and rhythm was being maintained on oral bisoprolol + flecainide.  Patient is chronically anticoagulated using standard dose apixaban.  Patient reported be compliant with DOAC therapy with no evidence or reports of GI/GU related bleeding.  Blood pressure well-controlled at 120/76 mmHg on currently prescribed beta-blocker (bisoprolol), diuretic (furosemide + HCTZ) and ARB (losartan) therapies.  Patient is on ranolazine for recurrent chest pain.  She is  on rosuvastatin for HLD diagnosis and further ASCVD prevention.  Patient is not diabetic.  She does have an OSAH diagnosis, however patient is reportedly noncompliant with prescribed nocturnal PAP therapy. Patient is able to complete all of her  ADL/IADLs without cardiovascular limitation.  Per the DASI, patient is able to achieve at least 4 METS of physical activity without experiencing any significant degree of angina/anginal equivalent symptoms.  Flecainide discontinued.  No other changes were made to her medication regimen.  Patient to follow-up with outpatient cardiology in 6 months or sooner if needed.  Amber Barnes is scheduled for an elective TOTAL KNEE ARTHROPLASTY (Left: Knee) on 05/11/2023 with Dr. Reinaldo Berber, MD.  Given patient's past medical history significant for cardiovascular diagnoses, presurgical cardiac clearance was sought by the PAT team.  Per cardiology, "Ms. Dundon's perioperative risk of a major cardiac event is 0.9% according to the Revised Cardiac Risk Index (RCRI).  Therefore, she is at low risk for perioperative complications.   Her functional capacity is fair at 6.05 METs according to the [place] Activity Status Index (DASI). According to ACC/AHA guidelines, no further cardiovascular testing needed.  The patient may proceed to surgery at ACCEPTABLE risk".  Again, this patient is on daily oral anticoagulation therapy using a DOAC medication. She has been instructed on recommendations for holding her apixaban for 3 days prior to her procedure with plans to restart as soon as postoperative bleeding risk felt to be minimized by her attending surgeon. The patient has been instructed that her last dose of her apixaban should be on 09/07/2023.  Patient reports previous perioperative complications with anesthesia in the past. PMH (+) for hypoxia and emergence delirium with general anesthesia. In review of the available records, it is noted that patient underwent a general anesthetic  course here at [facility] ([facility]) in 04/2023  without documented complications.       09/04/2023    2:10 PM 08/29/2023   11:45 AM 08/26/2023   10:30 AM  Vitals with BMI  Height  5\' 5"    Weight  248 lbs 10 oz   BMI  41.37   Systolic 113 130   Diastolic 59 76   Pulse 55 68 59    Providers/Specialists:   NOTE: Primary physician provider listed below. Patient may have been seen by APP or partner within same practice.   PROVIDER ROLE / SPECIALTY LAST Wilber Bihari, MD Orthopedics (Surgeon) 08/23/2023  Allegra Grana, FNP Primary Care Provider 08/29/2023  Julien Nordmann, MD Cardiology 02/07/2023; update preop APP call on 08/16/2023  Steffanie Dunn, MD Electrophysiology 03/15/2023  [name], MD Medical Oncology 08/17/2023   Allergies:  Hydrocodone, Prednisone, Amiodarone, Hydrocodone-acetaminophen, Tapentadol, Adhesive [tape], Latex, Oxycodone, Tramadol, and Wound dressing adhesive  Current Home Medications:   No current facility-administered medications for this encounter.    acetaminophen (TYLENOL) 500 MG tablet   ALPRAZolam (XANAX) 0.25 MG tablet   bisoprolol (ZEBETA) 10 MG tablet   busPIRone (BUSPAR) 5 MG tablet   Calcium Carbonate (CALCIUM 500 PO)   Cholecalciferol (VITAMIN D3) 5000 units CAPS   ELIQUIS 5 MG TABS tablet   escitalopram (LEXAPRO) 10 MG tablet   furosemide (LASIX) 20 MG tablet   losartan-hydrochlorothiazide (HYZAAR) 50-12.5 MG tablet   Multiple Vitamin (MULTIVITAMIN) tablet   potassium chloride SA (KLOR-CON M) 20 MEQ tablet   ranolazine (RANEXA) 500 MG 12 hr tablet   rOPINIRole (REQUIP) 2 MG tablet   rosuvastatin (CRESTOR) 40 MG tablet   History:   Past Medical History:  Diagnosis Date   Achilles tendinitis    Acute medial meniscal tear    Allergy    Anemia    Anxiety    a.) on BZO PRN (alprazolam)   Aortic atherosclerosis (HCC)    Ascending aorta dilatation (HCC) 10/20/2022   a.) TTE 10/20/2022:  asc Ao 39 mm   Atrial fibrillation/flutter (HCC) 07/2016   a.) Dx'd 07/2016; b.) CHA2DS2-VASc = 5 (age, sex, HFmrEF, HTN, vasc disease history) as of 05/09/2023; c.) s/p DCCV 09/07/2016 (150 J x 2 --> A.flutter; 200 J x 1 --> NSR); d.) s/p DCCV 10/19/2016 (150 J x 1 --> NSR); e.) brought in for DCCV 12/2016; cancelled d/t NSR; f.) s/p PVI ablation 12/12/2022; g.) cardiac rate/rhythm maintained on oral bisoprolol; chronically anticoagulated using apixaban   BMI 50.0-59.9, adult (HCC) 07/16/2018   CAD (coronary artery disease)    a.) cCTA 12/08/2022: Ca2+ = 31.6 (58th %'ile for age/sex/race match control)   Chemotherapy induced nausea and vomiting 05/18/2021   Chronic heart failure with mid-range ejection fraction (HFmrEF) (HCC)    a.) TTE 03/08/21: EF 60-65, mild LVH, G1DD, mild LAE, triv MR; b.) TTE 07/29/21: EF 60-65, G1DD, mild LAE, triv MR; c.) TTE 11/26/21: EF 55-60, G1DD, mild LAE, mild MR; d.) TTE 02/25/22: EF 60-65, G1DD, mild LAE, mild MR; e.) TTE 05/31/22: EF 55-60, mild LVH, G2DD, mild-mod MR; f.) TTE 10/20/22: EF 45-50, mild LAE, mod red RVSF, mild-mod TR, Asc Ao 39; g.) TTE 12/13/22: EF 50-55, mild LVH, mild MAC   CKD (chronic kidney disease), stage II    Colon polyp    Complication of anesthesia    a.) emergence delirium; b.) hypoxia after hip replacement   COPD (chronic obstructive pulmonary disease) (HCC)    DDD (degenerative disc disease), lumbar    a.) s/p fusion   Depression  Endometriosis    Fibrocystic breast disease    GERD (gastroesophageal reflux disease)    History of kidney stones    History of stress test    a.) MV 07/29/2016: EF 55-65%. Small, mild defect  in mid anteroseptal, apical septal, and apical locations. No ischemia-->low risk; b.) MV 10/2022: mod sized partially rev apical ant, apical septal, and apical defect. EF 52%. No signific cor Ca2+; c.) cCTA 12/08/2022: Ca2+ = 31.6 (58th %'ile).   History of tobacco use    Hyperlipidemia    Hypertension     Hypothyroidism    a.) s/p partial thyroidectomy for nodule; pathology benign   Insomnia    Left knee pain, unspecified chronicity    Lipoma    Malignant neoplasm of upper-outer quadrant of right female breast (HCC) 03/03/2021   a.) stage 1A invasive mammary carcinoma (cT1b or T1c N0); ER (-), PR (+), HER2/neu (+); did not tolerate adjuvant chemotherapy (paclitacel + trastuzumab) despite paclitaxel dose reduction; s/p adjuvant XRT; cinished maintenance trastuzumab 07/27/2022; no adjuvant endocrine therapy as no clear benefit   Migraine    Nephrolithiasis    On apixaban therapy    OSA (obstructive sleep apnea)    a.) non-compliant with nocturnal PAP therapy   Osteoarthritis    Osteopenia    Peptic ulcer disease    a.) H. pylori   Pericarditis 12/12/2022   a.) following A.fib ablation; resolved   Pneumonia    Port-A-Cath in place    Primary osteoarthritis of left knee    RLS (restless legs syndrome)    a.) on ropinirole   Vitamin D deficiency    Past Surgical History:  Procedure Laterality Date   ABDOMINAL HYSTERECTOMY     ovaries left   ACHILLES TENDON SURGERY     2012,01/2017   ATRIAL FIBRILLATION ABLATION N/A 12/12/2022   Procedure: ATRIAL FIBRILLATION ABLATION;  Surgeon: Lanier Prude, MD;  Location: [facility];  Service: Cardiovascular;  Laterality: N/A;   BREAST BIOPSY Right 01/15/2021   Affirm biopsy-"X" clip-INVASIVE MAMMARY CARCINOMA   BREAST BIOPSY Right 01/15/2021   u/s bx-"venus" clip INVASIVE MAMMARY CARCINOMA   BREAST BIOPSY     BREAST BIOPSY Right 01/04/2023   u/s bx ribbon clip path pending   BREAST BIOPSY Right 01/04/2023   [place] RT BREAST BX W LOC DEV 1ST LESION IMG BX SPEC US GUIDE 01/04/2023 [facility]   BREAST LUMPECTOMY,RADIO FREQ LOCALIZER,AXILLARY SENTINEL LYMPH NODE BIOPSY Right 02/17/2021   Procedure: BREAST LUMPECTOMY,RADIO FREQ LOCALIZER,AXILLARY SENTINEL LYMPH NODE BIOPSY;  Surgeon: Campbell Lerner, MD;  Location: [facility]S;  Service:  General;  Laterality: Right;   CARDIOVERSION N/A 09/07/2016   Procedure: CARDIOVERSION;  Surgeon: Antonieta Iba, MD;  Location: [facility];  Service: Cardiovascular;  Laterality: N/A;   CARDIOVERSION N/A 10/19/2016   Procedure: CARDIOVERSION;  Surgeon: Antonieta Iba, MD;  Location: [facility];  Service: Cardiovascular;  Laterality: N/A;   CARDIOVERSION N/A 12/14/2016   Procedure: CARDIOVERSION;  Surgeon: Antonieta Iba, MD;  Location: [facility];  Service: Cardiovascular;  Laterality: N/A;   COLONOSCOPY     COLONOSCOPY WITH PROPOFOL N/A 05/13/2020   Procedure: COLONOSCOPY WITH PROPOFOL;  Surgeon: Toledo, Boykin Nearing, MD;  Location: [facility];  Service: Gastroenterology;  Laterality: N/A;  ELIQUIS   CYSTOSCOPY WITH STENT PLACEMENT Bilateral 07/12/2021   Procedure: CYSTOSCOPY WITH STENT PLACEMENT;  Surgeon: Sondra Come, MD;  Location: [facility];  Service: Urology;  Laterality: Bilateral;   CYSTOSCOPY/URETEROSCOPY/HOLMIUM LASER/STENT PLACEMENT Bilateral 07/30/2021   Procedure:  CYSTOSCOPY/URETEROSCOPY/HOLMIUM LASER/BILATERAL STENT REMOVAL;  Surgeon: Sondra Come, MD;  Location: [facility];  Service: Urology;  Laterality: Bilateral;   ESOPHAGOGASTRODUODENOSCOPY     ESOPHAGOGASTRODUODENOSCOPY (EGD) WITH PROPOFOL N/A 05/13/2020   Procedure: ESOPHAGOGASTRODUODENOSCOPY (EGD) WITH PROPOFOL;  Surgeon: Toledo, Boykin Nearing, MD;  Location: [facility];  Service: Gastroenterology;  Laterality: N/A;   FOOT SURGERY Left    tendon   LUMBAR FUSION     PORTA CATH INSERTION N/A 03/23/2021   Procedure: PORTA CATH INSERTION;  Surgeon: Renford Dills, MD;  Location: [facility];  Service: Cardiovascular;  Laterality: N/A;   PORTACATH PLACEMENT N/A 03/23/2021   Procedure: ATTEMPTED INSERTION PORT-A-CATH;  Surgeon: Campbell Lerner, MD;  Location: [facility];  Service: General;  Laterality: N/A;   THYROIDECTOMY  1990   half thyroid lobectomy secondary to a benign nodule   TOTAL HIP  ARTHROPLASTY Right 08/21/2018   Procedure: TOTAL HIP ARTHROPLASTY ANTERIOR APPROACH-RIGHT CELL SAVER REQUESTED;  Surgeon: Kennedy Bucker, MD;  Location: [facility];  Service: Orthopedics;  Laterality: Right;   TOTAL HIP ARTHROPLASTY Left 05/11/2023   Procedure: TOTAL HIP ARTHROPLASTY ANTERIOR APPROACH;  Surgeon: Reinaldo Berber, MD;  Location: [facility];  Service: Orthopedics;  Laterality: Left;   Family History  Problem Relation Age of Onset   Cancer Mother        leukemia   Cancer Father 2       colon   Heart disease Father    Heart attack Father 4       died from MI   Hyperlipidemia Father    Hypertension Father    Cancer Sister 72       ovarian   Depression Sister    Cancer Brother        CLL   Breast cancer Paternal Aunt        80's   Migraines Neg Hx    Thyroid cancer Neg Hx    Social History   Tobacco Use   Smoking status: Former    Current packs/day: 0.00    Average packs/day: 0.3 packs/day for 10.0 years (2.5 ttl pk-yrs)    Types: Cigarettes    Start date: 18    Quit date: 2005    Years since quitting: 20.1    Passive exposure: Past   Smokeless tobacco: Never   Tobacco comments:    smoked for 10 years, 1 pack every 2-3 days  Vaping Use   Vaping status: Never Used  Substance Use Topics   Alcohol use: Yes    Alcohol/week: 1.0 standard drink of alcohol    Types: 1 Glasses of wine per week    Comment: margarita qd   Drug use: Yes    Types: Marijuana    Pertinent Clinical Results:  LABS:   Lab Results  Component Value Date   WBC 7.8 08/25/2023   HGB 13.4 08/25/2023   HCT 40.1 08/25/2023   MCV 92.6 08/25/2023   PLT 154 08/25/2023   Lab Results  Component Value Date   NA 139 08/25/2023   K 4.1 08/25/2023   CO2 27 08/25/2023   GLUCOSE 94 08/25/2023   BUN 15 08/25/2023   CREATININE 0.89 08/25/2023   CALCIUM 8.8 (L) 08/25/2023   GFR 57.12 (L) 05/05/2023   GFRNONAA >60 08/25/2023    ECG: Date: 08/25/2023 Time ECG obtained: 1031 AM Rate: 62  bpm Rhythm: NSR Axis (leads I and aVF): Normal Intervals: PR 192 ms. QRS 82 ms. QTc 442 ms. ST segment and T wave changes:  No significant acute ST or T wave abnormalities. Comparison: Previous tracing obtained on 03/15/2023 showed sinus bradycardia with sinus arrhythmia and first-degree AV block at a rate of 56 bpm.   IMAGING / PROCEDURES: DIAGNOSTIC RADIOGRAPHS OF LEFT KNEE 4+ VIEWS performed on 08/01/2023 Moderate to severe degenerative changes with medial, lateral and patellofemoral joint space narrowing with medial bone-on-bone articulation and flexion.   There is osteophyte formation, sclerosis and subchondral cyst formation Kellgren-Lawrence grade 3/4.   AP, flexed PA, and sunrise of the right knee show moderate degenerative changes with medial joint space narrowing and osteophyte formation. No fractures or dislocations noted in either knee.   TRANSTHORACIC ECHOCARDIOGRAM performed on 08/26/2023 Left ventricular ejection fraction, by estimation, is 60 to 65%. The left ventricle has normal function. The left ventricle has no regional wall motion abnormalities. Left ventricular diastolic parameters were normal.  Right ventricular systolic function is normal. The right ventricular size is normal. There is normal pulmonary artery systolic pressure. The estimated right ventricular systolic pressure is 25.6 mmHg.  The mitral valve is normal in structure. Mild mitral valve regurgitation. No evidence of mitral stenosis.  The aortic valve is normal in structure. Aortic valve regurgitation is not visualized. No aortic stenosis is present. Aortic valve mean gradient measures 5.0 mmHg.  The inferior vena cava is normal in size with greater than 50% respiratory variability, suggesting right atrial pressure of 3 mmHg.   CT ANGIO CHEST PE W AND/OR WO CONTRAST performed on 08/25/2023 No evidence of pulmonary embolism. No acute intrathoracic findings. Mild cardiomegaly. Aortic atherosclerosis  CT  CARDIAC MORPH/PULM VEIN W/CM&W/O CA SCORE performed on 12/08/2022 There is normal pulmonary vein drainage into the left atrium. (3 on the right and 2 on the left) The left atrial appendage is a Windsock-cactus type with ostial size 28 x 23 mm and length 32 mm, Area 47 mm2. There is no thrombus in the left atrial appendage. The esophagus runs in the left atrial midline and is not in the proximity to any of the pulmonary veins. Coronary calcium score 31.6. This was 58th percentile for age and gender matched controls. No significant extracardiac findings within the visualized chest.   MYOCARDIAL PERFUSION IMAGING STUDY (LEXISCAN) performed on 11/01/2022 There is a moderate in size, moderate to severe in severity he partially reversible defect involving the apical anterior, apical septal, and apical segments.  This is most consistent with ischemia and possible element of scar but cannot rule out artifact. Left ventricular systolic function is mildly reduced (LVEF 52% by Siemens calculation, 43% by QGS).  There is subtle hypokinesis of the apical septal Micra. No significant coronary artery calcification is noted, though aortic atherosclerosis and aortic valve and mitral annular calcifications are noted. This is an intermediate risk study.  ECHOCARDIOGRAM COMPLETE performed on 10/20/2022 Left ventricular ejection fraction, by estimation, is 45 to 50%. Left ventricular ejection fraction by 3D volume is 44 %. The left ventricle has mildly decreased function. The left ventricle demonstrates global hypokinesis. Left ventricular diastolic parameters are indeterminate. The average left ventricular global  longitudinal strain is -8.0 %. Right ventricular systolic function is moderately reduced. The right ventricular size is normal. There is normal pulmonary artery systolic pressure. The estimated right ventricular systolic pressure is 28.0 mmHg.  Left atrial size was mildly dilated.  The mitral valve is normal  in structure. Mild mitral valve regurgitation. No evidence of mitral stenosis.  Tricuspid valve regurgitation is mild to moderate.  The aortic valve has an indeterminant number of cusps.  Aortic valve regurgitation is not visualized. No aortic stenosis is present.  There is borderline dilatation of the ascending aorta, measuring 39 mm.  The inferior vena cava is normal in size with greater than 50% respiratory variability, suggesting right atrial pressure of 3 mmHg.   EXERCISE TOLERANCE TEST performed on 06/14/2022 No ST deviation was noted. Prior study not available for comparison. Inconclusive stress test due to lack of heart rate augmentation.    Impression and Plan:  Amber Barnes has been referred for pre-anesthesia review and clearance prior to her undergoing the planned anesthetic and procedural courses. Available labs, pertinent testing, and imaging results were personally reviewed by me in preparation for upcoming operative/procedural course. [facility] medical record has been updated following extensive record review and patient interview with PAT staff.   This patient has been appropriately cleared by cardiology with an overall ACCEPTABLE risk of experiencing significant perioperative cardiovascular complications. Based on clinical review performed today (09/08/23), barring any significant acute changes in the patient's overall condition, it is anticipated that she will be able to proceed with the planned surgical intervention. Any acute changes in clinical condition may necessitate her procedure being postponed and/or cancelled. Patient will meet with anesthesia team (MD and/or CRNA) on the day of her procedure for preoperative evaluation/assessment. Questions regarding anesthetic course will be fielded at that time.   Pre-surgical instructions were reviewed with the patient during her PAT appointment, and questions were fielded to satisfaction by PAT clinical staff. She has been  instructed on which medications that she will need to hold prior to surgery, as well as the ones that have been deemed safe/appropriate to take on the day of her procedure. As part of the general education provided by PAT, patient made aware both verbally and in writing, that she would need to abstain from the use of any illegal substances during her perioperative course.  She was advised that failure to follow the provided instructions could necessitate case cancellation or result in serious perioperative complications up to and including death. Patient encouraged to contact PAT and/or her surgeon's office to discuss any questions or concerns that may arise prior to surgery; verbalized understanding.   Quentin Mulling, MSN, APRN, FNP-C, CEN [facility]  Perioperative Services Nurse Practitioner Phone: [phone] Fax: [phone] 09/08/23 1:51 PM  NOTE: This note has been prepared using Dragon dictation software. Despite my best ability to proofread, there is always the potential that unintentional transcriptional errors may still occur from this process.

## 2023-09-10 ENCOUNTER — Other Ambulatory Visit: Payer: Self-pay | Admitting: Family

## 2023-09-10 DIAGNOSIS — F419 Anxiety disorder, unspecified: Secondary | ICD-10-CM

## 2023-09-10 MED ORDER — TRANEXAMIC ACID-NACL 1000-0.7 MG/100ML-% IV SOLN
1000.0000 mg | INTRAVENOUS | Status: AC
  Administered 2023-09-11 (×2): 1000 mg via INTRAVENOUS

## 2023-09-10 MED ORDER — ORAL CARE MOUTH RINSE: 15.0000 mL | Freq: Once | OROMUCOSAL | Status: AC

## 2023-09-10 MED ORDER — DEXAMETHASONE SODIUM PHOSPHATE 10 MG/ML IJ SOLN
8.0000 mg | Freq: Once | INTRAMUSCULAR | Status: AC
  Administered 2023-09-11: 8 mg via INTRAVENOUS

## 2023-09-10 MED ORDER — CHLORHEXIDINE GLUCONATE 0.12 % MT SOLN
15.0000 mL | Freq: Once | OROMUCOSAL | Status: AC
  Administered 2023-09-11: 15 mL via OROMUCOSAL

## 2023-09-10 MED ORDER — LACTATED RINGERS IV SOLN: INTRAVENOUS | Status: DC

## 2023-09-10 MED ORDER — CEFAZOLIN SODIUM-DEXTROSE 2-4 GM/100ML-% IV SOLN
2.0000 g | INTRAVENOUS | Status: AC
  Administered 2023-09-11: 2 g via INTRAVENOUS

## 2023-09-11 ENCOUNTER — Ambulatory Visit
Admission: RE | Admit: 2023-09-11 | Discharge: 2023-09-12 | Disposition: A | Discharge status: Home / Self Care | Payer: Medicare Other | Attending: Orthopedic Surgery | Admitting: Orthopedic Surgery

## 2023-09-11 ENCOUNTER — Ambulatory Visit

## 2023-09-11 ENCOUNTER — Other Ambulatory Visit: Payer: Self-pay

## 2023-09-11 ENCOUNTER — Ambulatory Visit: Payer: Self-pay | Admitting: Urgent Care

## 2023-09-11 ENCOUNTER — Encounter
Admission: RE | Disposition: A | Discharge status: Home / Self Care | Payer: Self-pay | Source: Home / Self Care | Attending: Orthopedic Surgery

## 2023-09-11 ENCOUNTER — Encounter: Payer: Self-pay | Admitting: Orthopedic Surgery

## 2023-09-11 DIAGNOSIS — Z96652 Presence of left artificial knee joint: Secondary | ICD-10-CM

## 2023-09-11 DIAGNOSIS — D631 Anemia in chronic kidney disease: Secondary | ICD-10-CM | POA: Insufficient documentation

## 2023-09-11 DIAGNOSIS — Z6841 Body Mass Index (BMI) 40.0 and over, adult: Secondary | ICD-10-CM | POA: Insufficient documentation

## 2023-09-11 DIAGNOSIS — Z7901 Long term (current) use of anticoagulants: Secondary | ICD-10-CM | POA: Insufficient documentation

## 2023-09-11 DIAGNOSIS — I5042 Chronic combined systolic (congestive) and diastolic (congestive) heart failure: Secondary | ICD-10-CM | POA: Diagnosis not present

## 2023-09-11 DIAGNOSIS — E66813 Obesity, class 3: Secondary | ICD-10-CM | POA: Insufficient documentation

## 2023-09-11 DIAGNOSIS — I480 Paroxysmal atrial fibrillation: Secondary | ICD-10-CM | POA: Insufficient documentation

## 2023-09-11 DIAGNOSIS — Z87891 Personal history of nicotine dependence: Secondary | ICD-10-CM | POA: Diagnosis not present

## 2023-09-11 DIAGNOSIS — J449 Chronic obstructive pulmonary disease, unspecified: Secondary | ICD-10-CM | POA: Insufficient documentation

## 2023-09-11 DIAGNOSIS — Z79899 Other long term (current) drug therapy: Secondary | ICD-10-CM | POA: Insufficient documentation

## 2023-09-11 DIAGNOSIS — Z9889 Other specified postprocedural states: Secondary | ICD-10-CM | POA: Insufficient documentation

## 2023-09-11 DIAGNOSIS — G43909 Migraine, unspecified, not intractable, without status migrainosus: Secondary | ICD-10-CM | POA: Diagnosis not present

## 2023-09-11 DIAGNOSIS — Z91198 Patient's noncompliance with other medical treatment and regimen for other reason: Secondary | ICD-10-CM | POA: Diagnosis not present

## 2023-09-11 DIAGNOSIS — I700 Atherosclerosis of aorta: Secondary | ICD-10-CM | POA: Insufficient documentation

## 2023-09-11 DIAGNOSIS — Z01812 Encounter for preprocedural laboratory examination: Secondary | ICD-10-CM

## 2023-09-11 DIAGNOSIS — I130 Hypertensive heart and chronic kidney disease with heart failure and stage 1 through stage 4 chronic kidney disease, or unspecified chronic kidney disease: Secondary | ICD-10-CM | POA: Diagnosis not present

## 2023-09-11 DIAGNOSIS — Z853 Personal history of malignant neoplasm of breast: Secondary | ICD-10-CM | POA: Insufficient documentation

## 2023-09-11 DIAGNOSIS — F32A Depression, unspecified: Secondary | ICD-10-CM | POA: Diagnosis not present

## 2023-09-11 DIAGNOSIS — G4733 Obstructive sleep apnea (adult) (pediatric): Secondary | ICD-10-CM | POA: Diagnosis not present

## 2023-09-11 DIAGNOSIS — I2510 Atherosclerotic heart disease of native coronary artery without angina pectoris: Secondary | ICD-10-CM | POA: Insufficient documentation

## 2023-09-11 DIAGNOSIS — N182 Chronic kidney disease, stage 2 (mild): Secondary | ICD-10-CM | POA: Insufficient documentation

## 2023-09-11 DIAGNOSIS — Z471 Aftercare following joint replacement surgery: Secondary | ICD-10-CM | POA: Diagnosis not present

## 2023-09-11 DIAGNOSIS — K219 Gastro-esophageal reflux disease without esophagitis: Secondary | ICD-10-CM | POA: Insufficient documentation

## 2023-09-11 DIAGNOSIS — R079 Chest pain, unspecified: Secondary | ICD-10-CM | POA: Diagnosis not present

## 2023-09-11 DIAGNOSIS — M1712 Unilateral primary osteoarthritis, left knee: Secondary | ICD-10-CM | POA: Diagnosis not present

## 2023-09-11 DIAGNOSIS — F419 Anxiety disorder, unspecified: Secondary | ICD-10-CM | POA: Insufficient documentation

## 2023-09-11 DIAGNOSIS — E785 Hyperlipidemia, unspecified: Secondary | ICD-10-CM | POA: Insufficient documentation

## 2023-09-11 HISTORY — DX: Pain in left knee: M25.562

## 2023-09-11 HISTORY — DX: Cardiomegaly: I51.7

## 2023-09-11 HISTORY — DX: Presence of left artificial knee joint: Z96.652

## 2023-09-11 HISTORY — DX: Unilateral primary osteoarthritis, left knee: M17.12

## 2023-09-11 HISTORY — PX: TOTAL KNEE ARTHROPLASTY: SHX125

## 2023-09-11 SURGERY — ARTHROPLASTY, KNEE, TOTAL
Anesthesia: Spinal | Site: Knee | Laterality: Left

## 2023-09-11 MED ORDER — ONDANSETRON HCL 4 MG/2ML IJ SOLN
INTRAMUSCULAR | Status: AC
  Filled 2023-09-11: qty 2

## 2023-09-11 MED ORDER — ONDANSETRON HCL 4 MG/2ML IJ SOLN: 4.0000 mg | Freq: Four times a day (QID) | INTRAMUSCULAR | Status: DC | PRN

## 2023-09-11 MED ORDER — BISOPROLOL FUMARATE 5 MG PO TABS
10.0000 mg | ORAL_TABLET | Freq: Two times a day (BID) | ORAL | Status: DC
  Administered 2023-09-11 – 2023-09-12 (×2): 10 mg via ORAL
  Filled 2023-09-11 (×2): qty 2

## 2023-09-11 MED ORDER — BUPIVACAINE HCL (PF) 0.5 % IJ SOLN
INTRAMUSCULAR | Status: AC
  Filled 2023-09-11: qty 10

## 2023-09-11 MED ORDER — LOSARTAN POTASSIUM-HCTZ 50-12.5 MG PO TABS: 0.5000 | ORAL_TABLET | Freq: Every day | ORAL | Status: DC | PRN

## 2023-09-11 MED ORDER — ACETAMINOPHEN 500 MG PO TABS
1000.0000 mg | ORAL_TABLET | Freq: Three times a day (TID) | ORAL | Status: DC
  Administered 2023-09-11 – 2023-09-12 (×3): 1000 mg via ORAL
  Filled 2023-09-11 (×3): qty 2

## 2023-09-11 MED ORDER — PROPOFOL 500 MG/50ML IV EMUL
INTRAVENOUS | Status: DC | PRN
Start: 2023-09-11 — End: 2023-09-11
  Administered 2023-09-11 (×2): 20 mg via INTRAVENOUS
  Administered 2023-09-11: 100 ug/kg/min via INTRAVENOUS

## 2023-09-11 MED ORDER — ROSUVASTATIN CALCIUM 10 MG PO TABS
40.0000 mg | ORAL_TABLET | Freq: Every day | ORAL | Status: DC
  Administered 2023-09-12: 40 mg via ORAL

## 2023-09-11 MED ORDER — KETOROLAC TROMETHAMINE 15 MG/ML IJ SOLN
7.5000 mg | Freq: Four times a day (QID) | INTRAMUSCULAR | Status: AC
  Administered 2023-09-11 – 2023-09-12 (×3): 7.5 mg via INTRAVENOUS
  Filled 2023-09-11 (×4): qty 1

## 2023-09-11 MED ORDER — SODIUM CHLORIDE (PF) 0.9 % IJ SOLN
INTRAMUSCULAR | Status: AC
  Filled 2023-09-11: qty 20

## 2023-09-11 MED ORDER — SODIUM CHLORIDE 0.9 % IV SOLN: INTRAVENOUS | Status: DC

## 2023-09-11 MED ORDER — METOCLOPRAMIDE HCL 5 MG/ML IJ SOLN: 5.0000 mg | Freq: Three times a day (TID) | INTRAMUSCULAR | Status: DC | PRN

## 2023-09-11 MED ORDER — PHENYLEPHRINE HCL-NACL 20-0.9 MG/250ML-% IV SOLN
INTRAVENOUS | Status: DC | PRN
  Administered 2023-09-11: 20 ug/min via INTRAVENOUS

## 2023-09-11 MED ORDER — KETAMINE HCL 50 MG/5ML IJ SOSY
PREFILLED_SYRINGE | INTRAMUSCULAR | Status: AC
  Filled 2023-09-11: qty 5

## 2023-09-11 MED ORDER — CEFAZOLIN SODIUM-DEXTROSE 2-4 GM/100ML-% IV SOLN
2.0000 g | Freq: Four times a day (QID) | INTRAVENOUS | Status: AC
  Administered 2023-09-11 (×2): 2 g via INTRAVENOUS
  Filled 2023-09-11 (×2): qty 100

## 2023-09-11 MED ORDER — BUSPIRONE HCL 5 MG PO TABS
5.0000 mg | ORAL_TABLET | Freq: Two times a day (BID) | ORAL | Status: DC
  Administered 2023-09-11 – 2023-09-12 (×2): 5 mg via ORAL
  Filled 2023-09-11 (×2): qty 1

## 2023-09-11 MED ORDER — ACETAMINOPHEN 10 MG/ML IV SOLN
INTRAVENOUS | Status: AC
  Filled 2023-09-11: qty 100

## 2023-09-11 MED ORDER — RANOLAZINE ER 500 MG PO TB12
500.0000 mg | ORAL_TABLET | Freq: Two times a day (BID) | ORAL | Status: DC
  Administered 2023-09-11 – 2023-09-12 (×2): 500 mg via ORAL
  Filled 2023-09-11 (×2): qty 1

## 2023-09-11 MED ORDER — ACETAMINOPHEN 10 MG/ML IV SOLN
INTRAVENOUS | Status: DC | PRN
  Administered 2023-09-11: 1000 mg via INTRAVENOUS

## 2023-09-11 MED ORDER — FENTANYL CITRATE (PF) 100 MCG/2ML IJ SOLN: 25.0000 ug | INTRAMUSCULAR | Status: DC | PRN

## 2023-09-11 MED ORDER — ROPINIROLE HCL 1 MG PO TABS
2.0000 mg | ORAL_TABLET | Freq: Every day | ORAL | Status: DC
  Administered 2023-09-11: 2 mg via ORAL
  Filled 2023-09-11: qty 2

## 2023-09-11 MED ORDER — BUPIVACAINE-EPINEPHRINE (PF) 0.25% -1:200000 IJ SOLN
INTRAMUSCULAR | Status: AC
  Filled 2023-09-11: qty 30

## 2023-09-11 MED ORDER — MIDAZOLAM HCL 2 MG/2ML IJ SOLN
INTRAMUSCULAR | Status: AC
  Filled 2023-09-11: qty 2

## 2023-09-11 MED ORDER — SURGIPHOR WOUND IRRIGATION SYSTEM - OPTIME: TOPICAL | Status: DC | PRN

## 2023-09-11 MED ORDER — KETAMINE HCL 50 MG/5ML IJ SOSY
PREFILLED_SYRINGE | INTRAMUSCULAR | Status: DC | PRN
  Administered 2023-09-11: 20 mg via INTRAVENOUS

## 2023-09-11 MED ORDER — GLYCOPYRROLATE 0.2 MG/ML IJ SOLN
INTRAMUSCULAR | Status: AC
  Filled 2023-09-11: qty 1

## 2023-09-11 MED ORDER — SODIUM CHLORIDE (PF) 0.9 % IJ SOLN
INTRAMUSCULAR | Status: DC | PRN
  Administered 2023-09-11: 71 mL

## 2023-09-11 MED ORDER — LOSARTAN POTASSIUM 50 MG PO TABS
50.0000 mg | ORAL_TABLET | ORAL | Status: DC | PRN
  Administered 2023-09-12: 50 mg via ORAL
  Filled 2023-09-11: qty 1

## 2023-09-11 MED ORDER — PROPOFOL 1000 MG/100ML IV EMUL
INTRAVENOUS | Status: AC
  Filled 2023-09-11: qty 100

## 2023-09-11 MED ORDER — DIPHENHYDRAMINE HCL 25 MG PO CAPS
25.0000 mg | ORAL_CAPSULE | Freq: Four times a day (QID) | ORAL | Status: DC | PRN
  Administered 2023-09-12 (×2): 25 mg via ORAL
  Filled 2023-09-11 (×2): qty 1

## 2023-09-11 MED ORDER — BUPIVACAINE HCL (PF) 0.5 % IJ SOLN
INTRAMUSCULAR | Status: DC | PRN
  Administered 2023-09-11: 2.5 mL via INTRATHECAL

## 2023-09-11 MED ORDER — TRAMADOL HCL 50 MG PO TABS
50.0000 mg | ORAL_TABLET | Freq: Four times a day (QID) | ORAL | Status: DC | PRN
  Administered 2023-09-12 (×2): 50 mg via ORAL
  Filled 2023-09-11 (×2): qty 1

## 2023-09-11 MED ORDER — HYDROCHLOROTHIAZIDE 12.5 MG PO TABS
12.5000 mg | ORAL_TABLET | ORAL | Status: DC | PRN
Start: 2023-09-11 — End: 2023-09-12
  Administered 2023-09-12: 12.5 mg via ORAL
  Filled 2023-09-11 (×2): qty 1

## 2023-09-11 MED ORDER — MENTHOL 3 MG MT LOZG: 1.0000 | LOZENGE | OROMUCOSAL | Status: DC | PRN

## 2023-09-11 MED ORDER — ONDANSETRON HCL 4 MG/2ML IJ SOLN
INTRAMUSCULAR | Status: DC | PRN
  Administered 2023-09-11: 4 mg via INTRAVENOUS

## 2023-09-11 MED ORDER — HYDROCODONE-ACETAMINOPHEN 5-325 MG PO TABS
1.0000 | ORAL_TABLET | ORAL | Status: DC | PRN
  Filled 2023-09-11: qty 1

## 2023-09-11 MED ORDER — LIDOCAINE HCL (CARDIAC) PF 100 MG/5ML IV SOSY
PREFILLED_SYRINGE | INTRAVENOUS | Status: DC | PRN
  Administered 2023-09-11: 40 mg via INTRAVENOUS

## 2023-09-11 MED ORDER — MORPHINE SULFATE (PF) 4 MG/ML IV SOLN: 0.5000 mg | INTRAVENOUS | Status: DC | PRN

## 2023-09-11 MED ORDER — ACETAMINOPHEN 325 MG PO TABS: 325.0000 mg | ORAL_TABLET | Freq: Four times a day (QID) | ORAL | Status: DC | PRN

## 2023-09-11 MED ORDER — MIDAZOLAM HCL 5 MG/5ML IJ SOLN
INTRAMUSCULAR | Status: DC | PRN
  Administered 2023-09-11: 2 mg via INTRAVENOUS

## 2023-09-11 MED ORDER — METOCLOPRAMIDE HCL 5 MG PO TABS
5.0000 mg | ORAL_TABLET | Freq: Three times a day (TID) | ORAL | Status: DC | PRN
  Filled 2023-09-11: qty 2

## 2023-09-11 MED ORDER — TRANEXAMIC ACID-NACL 1000-0.7 MG/100ML-% IV SOLN
INTRAVENOUS | Status: AC
  Filled 2023-09-11: qty 100

## 2023-09-11 MED ORDER — ESCITALOPRAM OXALATE 10 MG PO TABS
10.0000 mg | ORAL_TABLET | Freq: Every day | ORAL | Status: DC
  Administered 2023-09-12: 10 mg via ORAL
  Filled 2023-09-11: qty 1

## 2023-09-11 MED ORDER — DOCUSATE SODIUM 100 MG PO CAPS
100.0000 mg | ORAL_CAPSULE | Freq: Two times a day (BID) | ORAL | Status: DC
  Administered 2023-09-11 – 2023-09-12 (×3): 100 mg via ORAL
  Filled 2023-09-11 (×3): qty 1

## 2023-09-11 MED ORDER — FUROSEMIDE 20 MG PO TABS
20.0000 mg | ORAL_TABLET | Freq: Every day | ORAL | Status: DC
  Administered 2023-09-11 – 2023-09-12 (×2): 20 mg via ORAL
  Filled 2023-09-11 (×2): qty 1

## 2023-09-11 MED ORDER — CHLORHEXIDINE GLUCONATE 0.12 % MT SOLN
OROMUCOSAL | Status: AC
  Filled 2023-09-11: qty 15

## 2023-09-11 MED ORDER — SODIUM CHLORIDE 0.9 % IR SOLN
Status: DC | PRN
  Administered 2023-09-11: 3000 mL

## 2023-09-11 MED ORDER — GLYCOPYRROLATE PF 0.2 MG/ML IJ SOSY
PREFILLED_SYRINGE | INTRAMUSCULAR | Status: DC | PRN
  Administered 2023-09-11 (×2): .1 mg via INTRAVENOUS

## 2023-09-11 MED ORDER — DEXAMETHASONE SODIUM PHOSPHATE 10 MG/ML IJ SOLN
INTRAMUSCULAR | Status: AC
  Filled 2023-09-11: qty 1

## 2023-09-11 MED ORDER — PANTOPRAZOLE SODIUM 40 MG PO TBEC
40.0000 mg | DELAYED_RELEASE_TABLET | Freq: Every day | ORAL | Status: DC
  Administered 2023-09-11 – 2023-09-12 (×2): 40 mg via ORAL
  Filled 2023-09-11 (×2): qty 1

## 2023-09-11 MED ORDER — BUPIVACAINE LIPOSOME 1.3 % IJ SUSP
INTRAMUSCULAR | Status: AC
  Filled 2023-09-11: qty 20

## 2023-09-11 MED ORDER — APIXABAN 2.5 MG PO TABS
5.0000 mg | ORAL_TABLET | Freq: Two times a day (BID) | ORAL | Status: DC
Start: 2023-09-12 — End: 2023-09-12
  Administered 2023-09-12: 5 mg via ORAL

## 2023-09-11 MED ORDER — ONDANSETRON HCL 4 MG PO TABS: 4.0000 mg | ORAL_TABLET | Freq: Four times a day (QID) | ORAL | Status: DC | PRN

## 2023-09-11 MED ORDER — ALPRAZOLAM 0.25 MG PO TABS
0.2500 mg | ORAL_TABLET | Freq: Two times a day (BID) | ORAL | Status: DC | PRN
  Administered 2023-09-11: 0.25 mg via ORAL
  Filled 2023-09-11: qty 1

## 2023-09-11 MED ORDER — CEFAZOLIN SODIUM-DEXTROSE 2-4 GM/100ML-% IV SOLN
INTRAVENOUS | Status: AC
  Filled 2023-09-11: qty 100

## 2023-09-11 MED ORDER — PHENOL 1.4 % MT LIQD: 1.0000 | OROMUCOSAL | Status: DC | PRN

## 2023-09-11 SURGICAL SUPPLY — 70 items
BLADE PATELLA REAM PILOT HOLE (MISCELLANEOUS) IMPLANT
BLADE SAGITTAL AGGR TOOTH XLG (BLADE) IMPLANT
BLADE SAW SAG 25X90X1.19 (BLADE) ×1 IMPLANT
BLADE SAW SAG 29X58X.64 (BLADE) ×1 IMPLANT
BNDG ELASTIC 6INX 5YD STR LF (GAUZE/BANDAGES/DRESSINGS) ×1 IMPLANT
BOWL CEMENT MIX W/ADAPTER (MISCELLANEOUS) ×1 IMPLANT
BRUSH SCRUB EZ PLAIN DRY (MISCELLANEOUS) ×1 IMPLANT
CEMENT BONE R 1X40 (Cement) ×2 IMPLANT
CHLORAPREP W/TINT 26 (MISCELLANEOUS) ×2 IMPLANT
COMP FEM CEMT PERSONA STD SZ7 (Knees) ×1 IMPLANT
COMPONENT FEM CMT PRSN STD SZ7 (Knees) IMPLANT
COOLER POLAR GLACIER W/PUMP (MISCELLANEOUS) ×1 IMPLANT
CUFF TRNQT CYL 24X4X16.5-23 (TOURNIQUET CUFF) IMPLANT
CUFF TRNQT CYL 30X4X21-28X (TOURNIQUET CUFF) IMPLANT
DERMABOND ADVANCED .7 DNX12 (GAUZE/BANDAGES/DRESSINGS) ×1 IMPLANT
DRAPE SHEET LG 3/4 BI-LAMINATE (DRAPES) ×2 IMPLANT
DRSG MEPILEX SACRM 8.7X9.8 (GAUZE/BANDAGES/DRESSINGS) ×1 IMPLANT
DRSG OPSITE POSTOP 4X10 (GAUZE/BANDAGES/DRESSINGS) IMPLANT
DRSG OPSITE POSTOP 4X8 (GAUZE/BANDAGES/DRESSINGS) IMPLANT
ELECT REM PT RETURN 9FT ADLT (ELECTROSURGICAL) ×1 IMPLANT
ELECTRODE REM PT RTRN 9FT ADLT (ELECTROSURGICAL) ×1 IMPLANT
GLOVE BIO SURGEON STRL SZ8 (GLOVE) ×1 IMPLANT
GLOVE BIOGEL PI IND STRL 8 (GLOVE) ×1 IMPLANT
GLOVE PI ORTHO PRO STRL 7.5 (GLOVE) ×2 IMPLANT
GLOVE PI ORTHO PRO STRL SZ8 (GLOVE) ×2 IMPLANT
GLOVE SURG SYN 7.5 E (GLOVE) ×1 IMPLANT
GLOVE SURG SYN 7.5 PF PI (GLOVE) ×1 IMPLANT
GOWN SRG XL LVL 3 NONREINFORCE (GOWNS) ×1 IMPLANT
GOWN STRL REUS W/ TWL LRG LVL3 (GOWN DISPOSABLE) ×1 IMPLANT
GOWN STRL REUS W/ TWL XL LVL3 (GOWN DISPOSABLE) ×1 IMPLANT
HOOD PEEL AWAY T7 (MISCELLANEOUS) ×2 IMPLANT
IV NS IRRIG 3000ML ARTHROMATIC (IV SOLUTION) ×1 IMPLANT
KIT TURNOVER KIT A (KITS) ×1 IMPLANT
MANIFOLD NEPTUNE II (INSTRUMENTS) ×1 IMPLANT
MARKER SKIN DUAL TIP RULER LAB (MISCELLANEOUS) ×1 IMPLANT
MAT ABSORB FLUID 56X50 GRAY (MISCELLANEOUS) ×1 IMPLANT
NDL HYPO 21X1.5 SAFETY (NEEDLE) ×1 IMPLANT
NEEDLE HYPO 21X1.5 SAFETY (NEEDLE) ×1 IMPLANT
PACK TOTAL KNEE (MISCELLANEOUS) ×1 IMPLANT
PAD ARMBOARD 7.5X6 YLW CONV (MISCELLANEOUS) ×3 IMPLANT
PAD WRAPON POLAR KNEE (MISCELLANEOUS) ×1 IMPLANT
PAD WRAPON POLOR MULTI XL (MISCELLANEOUS) IMPLANT
PENCIL SMOKE EVACUATOR (MISCELLANEOUS) ×1 IMPLANT
PIN DRILL HDLS TROCAR 75 4PK (PIN) IMPLANT
PULSAVAC PLUS IRRIG FAN TIP (DISPOSABLE) ×1 IMPLANT
SCREW FEMALE HEX FIX 25X2.5 (ORTHOPEDIC DISPOSABLE SUPPLIES) IMPLANT
SCREW HEX HEADED 3.5X27 DISP (ORTHOPEDIC DISPOSABLE SUPPLIES) IMPLANT
SLEEVE SCD COMPRESS KNEE MED (STOCKING) ×1 IMPLANT
SOLUTION IRRIG SURGIPHOR (IV SOLUTION) ×1 IMPLANT
STEM POLY PAT PLY 32M KNEE (Knees) IMPLANT
STEM TIB ST PERS 14+30 (Stem) IMPLANT
STEM TIBIA 5 DEG SZ E L KNEE (Knees) IMPLANT
STEM TIBIAL SZ6-7 E-F10 (Stem) IMPLANT
SUT STRATA 1 CT-1 DLB (SUTURE) ×1 IMPLANT
SUT STRATAFIX 14 PDO 48 VLT (SUTURE) ×1 IMPLANT
SUT STRATAFIX PDO 1 14 VIOLET (SUTURE) ×1 IMPLANT
SUT VIC AB 0 CT1 36 (SUTURE) ×1 IMPLANT
SUT VIC AB 2-0 CT2 27 (SUTURE) ×2 IMPLANT
SUT VICRYL 1-0 27IN ABS (SUTURE) ×1 IMPLANT
SUTURE STRATA SPIR 4-0 18 (SUTURE) ×1 IMPLANT
SUTURE VICRYL 1-0 27IN ABS (SUTURE) ×1 IMPLANT
SYR 20ML LL LF (SYRINGE) ×2 IMPLANT
TAPE CLOTH 3X10 WHT NS LF (GAUZE/BANDAGES/DRESSINGS) ×1 IMPLANT
TIBIA STEM 5 DEG SZ E L KNEE (Knees) ×1 IMPLANT
TIP FAN IRRIG PULSAVAC PLUS (DISPOSABLE) ×1 IMPLANT
TOWEL OR 17X26 4PK STRL BLUE (TOWEL DISPOSABLE) IMPLANT
TRAP FLUID SMOKE EVACUATOR (MISCELLANEOUS) ×1 IMPLANT
WATER STERILE IRR 1000ML POUR (IV SOLUTION) ×1 IMPLANT
WRAP-ON POLOR PAD MULTI XL (MISCELLANEOUS) ×1 IMPLANT
WRAPON POLAR PAD KNEE (MISCELLANEOUS) IMPLANT

## 2023-09-11 NOTE — TOC Progression Note (Signed)
 Transition of Care [facility]) - Progression Note    Patient Details  Name: Amber Barnes MRN: [id number] Date of Birth: Jun 02, 1951  Transition of Care [facility]) CM/SW Contact  Marlowe Sax, RN Phone Number: 09/11/2023, 4:21 PM  Clinical Narrative:     Patient is set up with [facility] prior to surgery by the surgeons office has DME at home and does not need additonal       Expected Discharge Plan and Services                                               Social Determinants of Health (SDOH) Interventions SDOH Screenings   Food Insecurity: No Food Insecurity (09/11/2023)  Housing: Low Risk  (09/11/2023)  Transportation Needs: No Transportation Needs (09/11/2023)  Utilities: At Risk (09/11/2023)  Alcohol Screen: Low Risk  (07/14/2023)  Depression (PHQ2-9): Low Risk  (08/29/2023)  Financial Resource Strain: Low Risk  (08/23/2023)   Received from [facility]  Physical Activity: Insufficiently Active (07/14/2023)  Social Connections: Socially Integrated (09/11/2023)  Stress: Stress Concern Present (07/14/2023)  Tobacco Use: Medium Risk (09/11/2023)    Readmission Risk Interventions    07/13/2021    1:04 PM 04/09/2021    1:02 PM  Readmission Risk Prevention Plan  Transportation Screening Complete Complete  PCP or Specialist Appt within 3-5 Days  Complete  HRI or Home Care Consult  Complete  Social Work Consult for Recovery Care Planning/Counseling  Complete  Palliative Care Screening  Not Applicable  Medication Review Oceanographer) Complete Complete  Palliative Care Screening Not Applicable   Skilled Nursing Facility Not Applicable

## 2023-09-11 NOTE — Transfer of Care (Signed)
 Immediate Anesthesia Transfer of Care Note  Patient: Amber Barnes  Procedure(s) Performed: TOTAL KNEE ARTHROPLASTY (Left: Knee)  Patient Location: PACU  Anesthesia Type:Spinal  Level of Consciousness: awake, alert , and drowsy  Airway & Oxygen Therapy: Patient Spontanous Breathing and Patient connected to face mask oxygen  Post-op Assessment: Report given to RN and Post -op Vital signs reviewed and stable  Post vital signs: Reviewed and stable  Last Vitals:  Vitals Value Taken Time  BP 131/65 09/11/23 1232  Temp 35.9 1232  Pulse 65 09/11/23 1234  Resp 15 09/11/23 1234  SpO2 100 % 09/11/23 1234  Vitals shown include unfiled device data.  Last Pain:  Vitals:   09/11/23 0902  TempSrc: Temporal  PainSc: 0-No pain         Complications: No notable events documented.

## 2023-09-11 NOTE — Interval H&P Note (Signed)
 Patient history and physical updated. Consent reviewed including risks, benefits, and alternatives to surgery. Patient agrees with above plan to proceed with left total knee arthroplasty.

## 2023-09-11 NOTE — H&P (Signed)
 History of Present Illness: The patient is an 73 y.o. female seen in clinic today for history and physical for left total knee arthroplasty with Dr. Audelia Acton on 09/11/2023. She has x-rays showing complete loss of joint space the medial compartment of the left knee. She has had years of increasing pain with no improvement with conservative treatment. Pain is 10 out of 10 at its worst. Pain is located in the anterior medial aspect of the left knee. Most recent injections 1 year ago provided her with no relief. She is currently ambulating with a cane. She is also taking Tylenol. She is unable to do anti-inflammatory medications due to being on Eliquis. Her pain interferes with her quality of life and activities of daily living. The patient denies fevers, chills, numbness, tingling, shortness of breath, chest pain, recent illness, or any trauma.  Past Medical History: Past Medical History:  Diagnosis Date  Atrial fibrillation (CMS/HHS-HCC)  Colon polyp  Cystic breast  Depression  Endometriosis  Hyperlipidemia  Hypertension   Past Surgical History: Past Surgical History:  Procedure Laterality Date  ARTHROPLASTY HIP TOTAL Right 08/21/2018  COLONOSCOPY 05/13/2020  Tubular adenomas/Repeat 90yrs/TKT  EGD 05/13/2020  Normal EGD biopsies/Esophageal stenosis. Dilated/No repeat needed/TKT  back surgery  COLONOSCOPY  foot surgery  HYSTERECTOMY VAGINAL  thyroid sugery  UPPER GASTROINTESTINAL ENDOSCOPY   Past Family History: Family History  Problem Relation Age of Onset  Leukemia Mother  Coronary Artery Disease (Blocked arteries around heart) Father  Diabetes Father  Colon cancer Father   Medications: Current Outpatient Medications  Medication Sig Dispense Refill  ALPRAZolam (XANAX) 0.25 MG tablet Take by mouth Take 1 tablet (0.25 mg total) by mouth daily as needed for anxiety.  AMIOdarone (PACERONE) 200 MG tablet Take 200 mg by mouth once daily  apixaban (ELIQUIS) 5 mg tablet TAKE 1 TABLET BY  MOUTH TWICE A DAY  b complex vitamins capsule Take 1 capsule by mouth once daily  bisoprolol (ZEBETA) 5 MG tablet Take by mouth Take 0.5 tablets (2.5 mg total) by mouth daily.  calcium carbonate (CALCIUM 500 ORAL) 1 tablet daily  escitalopram oxalate (LEXAPRO) 10 MG tablet Take 1 tablet by mouth once daily  esomeprazole (NEXIUM) 20 MG DR capsule TAKE 1 CAPSULE BY MOUTH TWICE A DAY 180 capsule 3  FUROsemide (LASIX) 20 MG tablet Take 20 mg by mouth once daily  losartan (COZAAR) 25 MG tablet TAKE 1 TABLET BY MOUTH EVERY DAY  metoclopramide (REGLAN) 10 MG tablet Take 1 tablet by mouth 4 hours before starting colon prep and then you may take an additional tablet by mouth 6 hours after until finished 5 tablet 0  multivitamin (MULTIVITAMIN) tablet Take 1 tablet by mouth once daily  oxymetazoline (NO DRIP) 0.05 % nasal spray Place 1 spray into both nostrils once daily as needed  potassium chloride (KLOR-CON 10) 10 MEQ ER tablet Take 10 mEq by mouth once daily  ranolazine (RANEXA) 500 MG ER tablet TAKE 1 TABLET BY MOUTH TWICE A DAY  rizatriptan (MAXALT) 10 MG tablet Take 10 mg by mouth once as needed  rosuvastatin (CRESTOR) 20 MG tablet rosuvastatin 20 mg tablet TK 1 T PO D  sertraline (ZOLOFT) 100 MG tablet TAKE 1 TABLET BY MOUTH EVERY DAY  VITAMIN D3 ORAL 1 tablet daily   No current facility-administered medications for this visit.   Allergies: Allergies  Allergen Reactions  Oxycodone Itching  Can take it with benadryl  Tramadol Itching  Can take it with benadryl  Adhesive Tape-Silicones Itching and Rash  Prefers paper tape  Latex Itching and Rash  use paper tape use paper tap  Tapentadol Itching and Rash    Visit Vitals: Vitals:  08/23/23 0953  BP: 120/78    Review of Systems:  A comprehensive 14 point ROS was performed, reviewed, and the pertinent orthopaedic findings are documented in the HPI.  Physical Exam: General:  Well developed, well nourished, no apparent distress,  normal affect, antalgic gait with a cane  HEENT: Head normocephalic, atraumatic, PERRL.   Abdomen: Soft, non tender, non distended, Bowel sounds present.  Heart: Examination of the heart reveals regular, rate, and rhythm. There is no murmur noted on ascultation. There is a normal apical pulse.  Lungs: Lungs are clear to auscultation. There is no wheeze, rhonchi, or crackles. There is normal expansion of bilateral chest walls.  Comprehensive Knee Exam: Gait Non-antalgic  Alignment Neutral   Inspection Left  Skin Normal appearance with no obvious deformity. No ecchymosis or erythema.  Soft Tissue No focal soft tissue swelling  Quad Atrophy None   Palpation  Left  Tenderness Medial joint line tenderness to palpation  Crepitus + patellofemoral and tibiofemoral crepitus  Effusion None   Range of Motion Left  Flexion 0-115  Extension Full knee extension without hyperextension   Ligamentous Exam Left  Lachman Normal  Valgus 0 Normal  Valgus 30 Normal  Varus 0 Normal  Varus 30 Normal  Anterior Drawer Normal  Posterior Drawer Normal   Meniscal Exam Left  Hyperflexion Test Positive  Hyperextension Test Positive  McMurray's Negative   Neurovascular Left  Quadriceps Strength 5/5  Hamstring Strength 5/5  Hip Abductor Strength 4/5  Distal Motor Normal  Distal Sensory Normal light touch sensation  Distal Pulses Normal    Imaging Studies: X-rays of the left knee reviewed by me today from [facility] show moderate to severe degenerative changes with medial, lateral and patellofemoral joint space narrowing with medial bone-on-bone articulation and flexion. There is osteophyte formation, sclerosis and subchondral cyst formation Kellgren-Lawrence grade 3/4. AP, flexed PA, and sunrise of the right knee show moderate degenerative changes with medial joint space narrowing and osteophyte formation. No fractures or dislocations noted in either knee.   Assessment:  ICD-10-CM  1. Primary  osteoarthritis of left knee M17.12   Advanced left knee osteoarthritis  Plan:  Amber Barnes is a 73 year old female presents for history and physical for left total knee arthroplasty with Dr. Audelia Acton on 09/11/2023. She has complete loss of joint space along the medial compartment. She has had no relief with recent conservative treatment. Her pain interferes with her quality of life and activities day living. Risks, benefits, complications of a left total knee arthroplasty have been discussed with the patient and patient has agreed and consented procedure with Dr. Audelia Acton on 09/11/2023.  The hospitalization and post-operative care and rehabilitation were also discussed. The use of perioperative antibiotics and DVT prophylaxis were discussed. The risk, benefits and alternatives to a surgical intervention were discussed at length with the patient. The patient was also advised of risks related to the medical comorbidities and elevated body mass index (BMI). A lengthy discussion took place to review the most common complications including but not limited to: stiffness, loss of function, complex regional pain syndrome, deep vein thrombosis, pulmonary embolus, heart attack, stroke, infection, wound breakdown, numbness, intraoperative fracture, damage to nerves, tendon,muscles, arteries or other blood vessels, death and other possible complications from anesthesia. The patient was told that we will take steps to minimize these risks by using sterile  technique, antibiotics and DVT prophylaxis when appropriate and follow the patient postoperatively in the office setting to monitor progress. The possibility of recurrent pain, no improvement in pain and actual worsening of pain were also discussed with the patient.   The patient received clearance for surgery. Agrees with plan to proceed with left total knee arthroplasty.

## 2023-09-11 NOTE — Evaluation (Signed)
 Physical Therapy Evaluation Patient Details Name: Amber Barnes MRN: [id number] DOB: [birth year] Today's Date: 09/11/2023  History of Present Illness  Pt is a 73 yo F diagnosed with left knee osteoarthritis and is s/p elective L TKA.  PMH includes BLE THA, COPD, HTN, A-fib, and anxiety.  Of note pt reports history of cardiac ablation procedure with baseline HR of 50-60 bpm.  Clinical Impression  Pt was pleasant and motivated to participate during the session and put forth good effort throughout. Pt required no physical assistance during the session and presented with good stability in both sitting and standing.  Pt was able to ambulate forwards and backwards at the EOB and then from bed to chair with step-to pattern but with no overt LOB or L knee buckling.  Pt reported no adverse symptoms during the session with SpO2 and HR WNL throughout on room air. Pt will benefit from continued PT services upon discharge to safely address deficits listed in patient problem list for decreased caregiver assistance and eventual return to PLOF.           If plan is discharge home, recommend the following: A little help with walking and/or transfers;A little help with bathing/dressing/bathroom;Assistance with cooking/housework;Assist for transportation   Can travel by private vehicle        Equipment Recommendations None recommended by PT  Recommendations for Other Services       Functional Status Assessment Patient has had a recent decline in their functional status and demonstrates the ability to make significant improvements in function in a reasonable and predictable amount of time.     Precautions / Restrictions Precautions Precautions: Fall Recall of Precautions/Restrictions: Intact Restrictions Weight Bearing Restrictions Per Provider Order: Yes LLE Weight Bearing Per Provider Order: Weight bearing as tolerated      Mobility  Bed Mobility Overal bed mobility: Modified Independent              General bed mobility comments: Min extra time and effort only    Transfers Overall transfer level: Needs assistance Equipment used: Rolling walker (2 wheels) Transfers: Sit to/from Stand Sit to Stand: Contact guard assist           General transfer comment: Min verbal and visual cues for sequencing most notably for hand and L foot positioning    Ambulation/Gait Ambulation/Gait assistance: Contact guard assist Gait Distance (Feet): 10 Feet Assistive device: Rolling walker (2 wheels) Gait Pattern/deviations: Step-to pattern, Decreased stance time - left, Decreased step length - right Gait velocity: decreased     General Gait Details: Step-to sequencing education provided for initial bout of ambulation s/p surgery but pt demonstrated good control and stability throughout with no signs of L knee buckling  Stairs            Wheelchair Mobility     Tilt Bed    Modified Rankin (Stroke Patients Only)       Balance Overall balance assessment: Needs assistance   Sitting balance-Leahy Scale: Normal     Standing balance support: Bilateral upper extremity supported, During functional activity, Reliant on assistive device for balance Standing balance-Leahy Scale: Fair                               Pertinent Vitals/Pain Pain Assessment Pain Assessment: No/denies pain    Home Living Family/patient expects to be discharged to:: Private residence Living Arrangements: Spouse/significant other Available Help at Discharge: Family;Available 24 hours/day Type of Home:  Apartment Home Access: Level entry       Home Layout: One level Home Equipment: Agricultural consultant (2 wheels);Rollator (4 wheels);BSC/3in1;Wheelchair - power;Cane - single point Additional Comments: Spouse has MS but able to assist with chorers around the home/meal prep, other family available to assist after 3PM as well    Prior Function Prior Level of Function :  Independent/Modified Independent             Mobility Comments: Ind amb in the home without an AD, SPC in the community, one fall in the last 6 months due to tripping on carpet ADLs Comments: Ind with ADLs     Extremity/Trunk Assessment   Upper Extremity Assessment Upper Extremity Assessment: Overall WFL for tasks assessed    Lower Extremity Assessment Lower Extremity Assessment: LLE deficits/detail LLE Deficits / Details: BLE ankle strength and AROM grossly WNL; sensation to light touch intact to the LLE but decreased compared to R; LLE hip flex strength >/= 3/5 LLE Sensation: decreased light touch LLE Coordination: WNL       Communication   Communication Communication: No apparent difficulties    Cognition Arousal: Alert Behavior During Therapy: WFL for tasks assessed/performed   PT - Cognitive impairments: No apparent impairments                         Following commands: Intact       Cueing Cueing Techniques: Verbal cues, Gestural cues, Visual cues, Tactile cues     General Comments      Exercises Total Joint Exercises Ankle Circles/Pumps: AROM, Strengthening, Both, 10 reps Quad Sets: AROM, Strengthening, Left, 10 reps, 5 reps Straight Leg Raises: AROM, Strengthening, Left, 5 reps Long Arc Quad: AROM, Strengthening, Left, 5 reps, 10 reps Knee Flexion: AROM, Strengthening, Left, 5 reps, 10 reps Goniometric ROM: L knee AROM: 4-103 deg Marching in Standing: AROM, Strengthening, Both, 5 reps, Standing Other Exercises Other Exercises: HEP education per handout with emphasis on LLE QS and seated knee flex Other Exercises: Positioning education to promote L knee ext PROM and prevent heel pressure   Assessment/Plan    PT Assessment Patient needs continued PT services  PT Problem List Decreased strength;Decreased range of motion;Decreased activity tolerance;Decreased balance;Decreased mobility;Decreased knowledge of use of DME       PT Treatment  Interventions DME instruction;Gait training;Stair training;Functional mobility training;Therapeutic activities;Therapeutic exercise;Balance training;Patient/family education    PT Goals (Current goals can be found in the Care Plan section)  Acute Rehab PT Goals Patient Stated Goal: To walk with less pain PT Goal Formulation: With patient Time For Goal Achievement: 09/24/23 Potential to Achieve Goals: Good    Frequency BID     Co-evaluation               AM-PAC PT "6 Clicks" Mobility  Outcome Measure Help needed turning from your back to your side while in a flat bed without using bedrails?: None Help needed moving from lying on your back to sitting on the side of a flat bed without using bedrails?: None Help needed moving to and from a bed to a chair (including a wheelchair)?: A Little Help needed standing up from a chair using your arms (e.g., wheelchair or bedside chair)?: A Little Help needed to walk in hospital room?: A Lot Help needed climbing 3-5 steps with a railing? : A Lot 6 Click Score: 18    End of Session Equipment Utilized During Treatment: Gait belt Activity Tolerance: Patient tolerated treatment  well Patient left: in chair;with call bell/phone within reach;with SCD's reapplied;Other (comment) (Polar care donned to L knee) Nurse Communication: Mobility status;Weight bearing status PT Visit Diagnosis: Other abnormalities of gait and mobility (R26.89);Muscle weakness (generalized) (M62.81);History of falling (Z91.81)    Time: 2841-3244 PT Time Calculation (min) (ACUTE ONLY): 38 min   Charges:   PT Evaluation $PT Eval Moderate Complexity: 1 Mod PT Treatments $Therapeutic Exercise: 8-22 mins PT General Charges $$ ACUTE PT VISIT: 1 Visit       D. Elly Modena PT, DPT 09/11/23, 4:31 PM

## 2023-09-11 NOTE — Op Note (Signed)
 Patient Name: Amber Barnes  ZOX:[id number]  Pre-Operative Diagnosis: Left knee Osteoarthritis  Post-Operative Diagnosis: (same)  Procedure: Left Total Knee Arthroplasty  Components/Implants: Femur: Persona Size 7 CR   Tibia: Persona Size E w/ 14x17mm stem extension  Poly: 10mm MC  Patella: 32x8.48mm symmetric  Femoral Valgus Cut Angle: 5 degrees  Distal Femoral Re-cut: +64mm distal recut  Patella Resurfacing: yes   Date of Surgery: 09/11/2023  Surgeon: Reinaldo Berber MD  Assistant: Amador Cunas PA (present and scrubbed throughout the case, critical for assistance with exposure, retraction, instrumentation, and closure), [place] PAs   Anesthesiologist: Karlton Lemon  Anesthesia: Spinal   Tourniquet Time: 59 min  EBL: 50cc  IVF: 400cc  Complications: None   Brief history: The patient is a 73 year old female with a history of osteoarthritis of the left knee with pain limiting their range of motion and activities of daily living, which has failed multiple attempts at conservative therapy.  The risks and benefits of total knee arthroplasty as definitive surgical treatment were discussed with the patient, who opted to proceed with the operation.  After outpatient medical clearance and optimization was completed the patient was admitted to [facility] for the procedure.  All preoperative films were reviewed and an appropriate surgical plan was made prior to surgery. Preoperative range of motion was 0 to 115.  Description of procedure: The patient was brought to the operating room where laterality was confirmed by all those present to be the left side.   Spinal anesthesia was administered and the patient received an intravenous dose of antibiotics for surgical prophylaxis and a dose of tranexamic acid.  Patient is positioned supine on the operating room table with all bony prominences well-padded.  A well-padded tourniquet was applied to the left thigh.  The knee  was then prepped and draped in usual sterile fashion with multiple layers of adhesive and nonadhesive drapes.  All of those present in the operating room participated in a surgical timeout laterality and patient were confirmed.   An Esmarch was wrapped around the extremity and the leg was elevated and the knee flexed.  The tourniquet was inflated to a pressure of 275 mmHg. The Esmarch was removed and the leg was brought down to full extension.  The patella and tibial tubercle identified and outlined using a marking pen and a midline skin incision was made with a knife carried through the subcutaneous tissue down to the extensor retinaculum.  After exposure of the extensor mechanism the medial parapatellar arthrotomy was performed with a scalpel and electrocautery extending down medial and distal to the tibial tubercle taking care to avoid incising the patellar tendon.   A standard medial release was performed over the proximal tibia.  The knee was brought into extension in order to excise the fat pad taking care not to damage the patella tendon.  The superior soft tissue was removed from the anterior surface of the distal femur to visualize for the procedure.  The knee was then brought into flexion with the patella subluxed laterally and subluxing the tibia anteriorly.  The ACL was transected and removed with electrocautery and additional soft tissue was removed from the proximal surface of the tibia to fully expose. The PCL was found to be intact and was preserved.  An extramedullary tibial cutting guide was then applied to the leg with a spring-loaded ankle clamp placed around the distal tibia just above the malleoli the angulation of the guide was adjusted to give some posterior slope in the  tibial resection with an appropriate varus/valgus alignment.  The resection guide was then pinned to the proximal tibia and the proximal tibial surface was resected with an oscillating saw.  Careful attention was paid to  ensure the blade did not disrupt any of the soft tissues including any lateral or medial ligament.  Attention was then turned to the femur, with the knee slightly flexed a opening drill was used to enter the medullary canal of the femur.  After removing the drill marrow was suctioned out to decompress the distal femur.  An intramedullary femoral guide was then inserted into the drill hole and the alignment guide was seated firmly against the distal end of the medial femoral condyle.  The distal femoral cutting guide was then attached and pinned securely to the anterior surface of the femur and the intramedullary rod and alignment guide was removed.  Distal femur resection was then performed with an oscillating saw with retractors protecting medial and laterally.   The distal cutting block was then removed and the extension gap was checked with a spacer.  Extension gap was found to be appropriately sized to accommodate the spacer block. The spacer block was found to be tight and did not allow full extension so the femoral cutting guide was reattached and additional 2 mm of distal femur were resected.   The femoral sizing guide was then placed securely into the posterior condyles of the femur and the femoral size was measured and determined to be 7.  The size 7; 4-in-1 cutting guide was placed in position and secured with 2 pins.  The anterior posterior and chamfer resections were then performed with an oscillating saw.  Bony fragments and osteophytes were then removed.  Using a lamina spreader the posterior medial and lateral condyles were checked for additional osteophytes and posterior soft tissue remnants.  Any remaining meniscus was removed at this time.  Periarticular injection was performed in the meniscal rims and posterior capsule with aspiration performed to ensure no intravascular injection.   The tibia was then exposed and the tibial trial was pinned onto the plateau after confirming appropriate  orientation and rotation.  Using the drill bushing the tibia was prepared to the appropriate drill depth.  Tibial broach impactor was then driven through the punch guide using a mallet.  The femoral trial component was then inserted onto the femur.  A trial tibial polyethylene bearing was then placed and the knee was reduced.  The knee achieved full extension with no hyperextension and was found to be balanced in flexion and extension with the trials in place.  The knee was then brought into full extension the patella was everted and held with 2 Kocher clamps.  The articular surface of the patella was then resected with an patella reamer and saw after careful measurement with a caliper.  The patella was then prepared with the drill guide and a trial patella was placed.  The knee was then taken through range of motion and it was found that the patella articulated appropriately with the trochlea and good patellofemoral motion without subluxation.    The correct final components for implantation were confirmed and opened by the circulator nurse.  A bone plug was fashioned from the patient's bone cuts to plug the intramedullary femoral canal access hole and was tamped into place.  The prepared surfaces of the patella femur and tibia were cleaned with pulsatile lavage to remove all blood fat and other material and then the surfaces were dried.  2 bags of cement were mixed under vacuum and the components were cemented into place.  Excess cement was removed with curettes and forceps. A trial polyethylene tibial component was placed and the knee was brought into extension to allow the cement to set.  At this time the periarticular injection cocktail was placed in the soft tissues surrounding the knee.  After full curing of the cement the balance of the knee was checked again and the final polyethylene size was confirmed. The tibial component was irrigated and locking mechanism checked to ensure it was clear of debris.  The real polyethylene tibial component was implanted and the knee was brought through a range of motion.   The knee was then irrigated with copious amount of normal saline via pulsatile lavage to remove all loose bodies and other debris.  The knee was then irrigated with surgiphor betadine based wash and reirrigated with saline.  The tourniquet was then dropped and all bleeding vessels were identified and coagulated.  The arthrotomy was approximated with #1 Vicryl and closed with #1 Stratafix suture.  The knee was brought into slight flexion and the subcutaneous tissues were closed with 0 Vicryl, 2-0 Vicryl and a running subcuticular 4-0 stratafix barbed suture.  Skin was then glued with Dermabond.  A sterile adhesive dressing was then placed along with a sequential compression device to the calf, a Ted stocking, and a cryotherapy cuff.   Sponge, needle, and Lap counts were all correct at the end of the case.   The patient was transferred off of the operating room table to a hospital bed, good pulses were found distally on the operative side.  The patient was transferred to the recovery room in stable condition.

## 2023-09-11 NOTE — Anesthesia Procedure Notes (Signed)
 Spinal  Patient location during procedure: OR Start time: 09/11/2023 10:23 AM End time: 09/11/2023 10:31 AM Reason for block: surgical anesthesia Staffing Performed: resident/CRNA  Resident/CRNA: Morene Crocker, CRNA Performed by: Morene Crocker, CRNA Authorized by: Lenard Simmer, MD   Preanesthetic Checklist Completed: patient identified, IV checked, site marked, risks and benefits discussed, surgical consent, monitors and equipment checked, pre-op evaluation and timeout performed Spinal Block Patient position: sitting Prep: ChloraPrep Patient monitoring: heart rate, continuous pulse ox and blood pressure Approach: midline Location: L3-4 Injection technique: single-shot Needle Needle type: Introducer and Pencan  Needle gauge: 24 G Needle length: 10 cm Assessment Sensory level: T10 Events: CSF return Additional Notes Negative paresthesia. Negative blood return. Positive free-flowing CSF. Expiration date of kit checked and confirmed. Patient tolerated procedure well, without complications. Successful on first attempt, no complications noted.

## 2023-09-11 NOTE — Anesthesia Preprocedure Evaluation (Signed)
 Anesthesia Evaluation  Patient identified by MRN, date of birth, ID band Patient awake    Reviewed: Allergy & Precautions, NPO status , Patient's Chart, lab work & pertinent test results  History of Anesthesia Complications (+) Emergence Delirium and history of anesthetic complications  Airway Mallampati: III  TM Distance: >3 FB Neck ROM: full    Dental  (+) Chipped, Dental Advidsory Given   Pulmonary neg shortness of breath, sleep apnea , COPD, neg recent URI, former smoker   Pulmonary exam normal        Cardiovascular hypertension, On Medications and On Home Beta Blockers (-) angina + CAD and +CHF  (-) Past MI and (-) Cardiac Stents + dysrhythmias (s/p ablation) Atrial Fibrillation (-) Valvular Problems/Murmurs  Follow-up limited TTE status post cardiac ablation performed on 12/13/2022.  EF had improved to 50-55%.  There were no regional wall motion abnormalities.  Mild LVH noted.  There was moderate mitral annular calcification.   Per cardiology, "Amber Barnes's perioperative risk of a major cardiac event is 0.4% according to the Revised Cardiac Risk Index (RCRI).  Therefore, she is at LOW risk for perioperative complications.  According to ACC/AHA guidelines, no further cardiovascular testing needed.  The patient may proceed to surgery at ACCEPTABLE risk."   Neuro/Psych  Headaches, neg Seizures PSYCHIATRIC DISORDERS Anxiety Depression     Neuromuscular disease    GI/Hepatic Neg liver ROS, PUD,GERD  Controlled,,  Endo/Other  Hypothyroidism  Class 3 obesity  Renal/GU Renal disease     Musculoskeletal   Abdominal   Peds  Hematology   Anesthesia Other Findings Past Medical History: No date: Achilles tendinitis No date: Acute medial meniscal tear No date: Allergy No date: Anemia No date: Anxiety     Comment:  a.) on BZO PRN (alprazolam) No date: Aortic atherosclerosis (HCC) 10/20/2022: Ascending aorta dilatation (HCC)      Comment:  a.) TTE 10/20/2022: asc Ao 39 mm 07/2016: Atrial fibrillation/flutter (HCC)     Comment:  a.) Dx'd 07/2016; b.) CHA2DS2-VASc = 5 (age, sex,               HFmrEF, HTN, vasc disease history) as of 05/09/2023; c.)               s/p DCCV 09/07/2016 (150 J x 2 --> A.flutter; 200 J x 1               --> NSR); d.) s/p DCCV 10/19/2016 (150 J x 1 --> NSR);               e.) brought in for DCCV 12/2016; cancelled d/t NSR; f.)               s/p PVI ablation 12/12/2022; g.) cardiac rate/rhythm               maintained on oral bisoprolol; chronically anticoagulated              using apixaban No date: CAD (coronary artery disease)     Comment:  a.) cCTA 12/08/2022: Ca2+ = 31.6 (58th %'ile for               age/sex/race match control) 05/18/2021: Chemotherapy induced nausea and vomiting No date: Chronic heart failure with mid-range ejection fraction  (HFmrEF) (HCC)     Comment:  a.) TTE 03/08/21: EF 60-65, mild LVH, G1DD, mild LAE,               triv MR; b.) TTE 07/29/21: EF 60-65, G1DD, mild  LAE, triv               MR; c.) TTE 11/26/21: EF 55-60, G1DD, mild LAE, mild MR;               d.) TTE 02/25/22: EF 60-65, G1DD, mild LAE, mild MR; e.)               TTE 05/31/22: EF 55-60, mild LVH, G2DD, mild-mod MR; f.)               TTE 10/20/22: EF 45-50, mild LAE, mod red RVSF, mild-mod               TR, Asc Ao 39; g.) TTE 12/13/22: EF 50-55, mild LVH, mild               MAC No date: CKD (chronic kidney disease), stage II No date: Colon polyp No date: Complication of anesthesia     Comment:  a.) emergence delirium; b.) hypoxia after hip               replacement No date: COPD (chronic obstructive pulmonary disease) (HCC) No date: DDD (degenerative disc disease), lumbar     Comment:  a.) s/p fusion No date: Depression No date: Endometriosis No date: Fibrocystic breast disease No date: GERD (gastroesophageal reflux disease) No date: History of stress test     Comment:  a.) MV 07/29/2016: EF  55-65%. Small, mild defect  in mid              anteroseptal, apical septal, and apical locations. No               ischemia-->low risk; b.) MV 10/2022: mod sized partially               rev apical ant, apical septal, and apical defect. EF 52%.              No signific cor Ca2+; c.) cCTA 12/08/2022: Ca2+ = 31.6               (58th %'ile). No date: History of tobacco use No date: Hyperlipidemia No date: Hypertension No date: Hypothyroidism     Comment:  a.) s/p partial thyroidectomy for nodule; pathology               benign No date: Insomnia No date: Lipoma 03/03/2021: Malignant neoplasm of upper-outer quadrant of right  female breast (HCC)     Comment:  a.) stage 1A invasive mammary carcinoma (cT1b or T1c               N0); ER (-), PR (+), HER2/neu (+); did not tolerate               adjuvant chemotherapy (paclitacel + trastuzumab) despite               paclitaxel dose reduction; s/p adjuvant XRT; cinished               maintenance trastuzumab 07/27/2022; no adjuvant endocrine               therapy as no clear benefit No date: Migraine No date: Nephrolithiasis No date: On apixaban therapy No date: OSA (obstructive sleep apnea)     Comment:  a.) non-compliant with nocturnal PAP therapy No date: Osteoarthritis No date: Osteopenia No date: Peptic ulcer disease     Comment:  a.) H. pylori 12/12/2022: Pericarditis     Comment:  a.) following A.fib ablation; resolved No date:  Pneumonia No date: Port-A-Cath in place No date: RLS (restless legs syndrome)     Comment:  a.) on ropinirole No date: Vitamin D deficiency  Past Surgical History: No date: ABDOMINAL HYSTERECTOMY     Comment:  ovaries left No date: ACHILLES TENDON SURGERY     Comment:  2012,01/2017 12/12/2022: ATRIAL FIBRILLATION ABLATION; N/A     Comment:  Procedure: ATRIAL FIBRILLATION ABLATION;  Surgeon:               Lanier Prude, MD;  Location: [facility];                Service: Cardiovascular;   Laterality: N/A; 01/15/2021: BREAST BIOPSY; Right     Comment:  Affirm biopsy-"X" clip-INVASIVE MAMMARY CARCINOMA 01/15/2021: BREAST BIOPSY; Right     Comment:  u/s bx-"venus" clip INVASIVE MAMMARY CARCINOMA No date: BREAST BIOPSY 01/04/2023: BREAST BIOPSY; Right     Comment:  u/s bx ribbon clip path pending 01/04/2023: BREAST BIOPSY; Right     Comment:  [place] RT BREAST BX W LOC DEV 1ST LESION IMG BX SPEC [place]               GUIDE 01/04/2023 [facility] 02/17/2021: BREAST LUMPECTOMY,RADIO FREQ LOCALIZER,AXILLARY SENTINEL  LYMPH NODE BIOPSY; Right     Comment:  Procedure: BREAST LUMPECTOMY,RADIO FREQ               LOCALIZER,AXILLARY SENTINEL LYMPH NODE BIOPSY;  Surgeon:               Campbell Lerner, MD;  Location: [facility];  Service:               General;  Laterality: Right; 09/07/2016: CARDIOVERSION; N/A     Comment:  Procedure: CARDIOVERSION;  Surgeon: Antonieta Iba,               MD;  Location: [facility];  Service: Cardiovascular;                Laterality: N/A; 10/19/2016: CARDIOVERSION; N/A     Comment:  Procedure: CARDIOVERSION;  Surgeon: Antonieta Iba,               MD;  Location: [facility];  Service: Cardiovascular;                Laterality: N/A; 12/14/2016: CARDIOVERSION; N/A     Comment:  Procedure: CARDIOVERSION;  Surgeon: Antonieta Iba,               MD;  Location: [facility];  Service: Cardiovascular;                Laterality: N/A; No date: COLONOSCOPY 05/13/2020: COLONOSCOPY WITH PROPOFOL; N/A     Comment:  Procedure: COLONOSCOPY WITH PROPOFOL;  Surgeon: [place],               Boykin Nearing, MD;  Location: [facility];  Service:               Gastroenterology;  Laterality: N/A;  ELIQUIS 07/12/2021: CYSTOSCOPY WITH STENT PLACEMENT; Bilateral     Comment:  Procedure: CYSTOSCOPY WITH STENT PLACEMENT;  Surgeon:               Sondra Come, MD;  Location: [facility];  Service:               Urology;  Laterality: Bilateral; 07/30/2021:  CYSTOSCOPY/URETEROSCOPY/HOLMIUM LASER/STENT PLACEMENT;  Bilateral     Comment:  Procedure: CYSTOSCOPY/URETEROSCOPY/HOLMIUM  LASER/BILATERAL STENT REMOVAL;  Surgeon: Sondra Come, MD;  Location: [facility];  Service: Urology;                Laterality: Bilateral; No date: ESOPHAGOGASTRODUODENOSCOPY 05/13/2020: ESOPHAGOGASTRODUODENOSCOPY (EGD) WITH PROPOFOL; N/A     Comment:  Procedure: ESOPHAGOGASTRODUODENOSCOPY (EGD) WITH               PROPOFOL;  Surgeon: Toledo, Boykin Nearing, MD;  Location:               [facility];  Service: Gastroenterology;  Laterality:               N/A; No date: FOOT SURGERY; Left     Comment:  tendon No date: LUMBAR FUSION 03/23/2021: PORTA CATH INSERTION; N/A     Comment:  Procedure: PORTA CATH INSERTION;  Surgeon: Renford Dills, MD;  Location: [facility];  Service:              Cardiovascular;  Laterality: N/A; 03/23/2021: PORTACATH PLACEMENT; N/A     Comment:  Procedure: ATTEMPTED INSERTION PORT-A-CATH;  Surgeon:               Campbell Lerner, MD;  Location: [facility];  Service:               General;  Laterality: N/A; 1990: THYROIDECTOMY     Comment:  half thyroid lobectomy secondary to a benign nodule 08/21/2018: TOTAL HIP ARTHROPLASTY; Right     Comment:  Procedure: TOTAL HIP ARTHROPLASTY ANTERIOR               APPROACH-RIGHT CELL SAVER REQUESTED;  Surgeon: Kennedy Bucker, MD;  Location: [facility];  Service: Orthopedics;               Laterality: Right;  BMI    Body Mass Index: 40.02 kg/m      Reproductive/Obstetrics negative OB ROS                             Anesthesia Physical Anesthesia Plan  ASA: 3  Anesthesia Plan: Spinal   Post-op Pain Management: Ofirmev IV (intra-op)* and Toradol IV (intra-op)*   Induction: Intravenous  PONV Risk Score and Plan: 2 and Dexamethasone, Ondansetron and Treatment may vary due to age or medical  condition  Airway Management Planned: Natural Airway and Simple Face Mask  Additional Equipment:   Intra-op Plan:   Post-operative Plan:   Informed Consent: I have reviewed the patients History and Physical, chart, labs and discussed the procedure including the risks, benefits and alternatives for the proposed anesthesia with the patient or authorized representative who has indicated his/her understanding and acceptance.     Dental Advisory Given  Plan Discussed with: Anesthesiologist, CRNA and Surgeon  Anesthesia Plan Comments:         Anesthesia Quick Evaluation

## 2023-09-12 DIAGNOSIS — I130 Hypertensive heart and chronic kidney disease with heart failure and stage 1 through stage 4 chronic kidney disease, or unspecified chronic kidney disease: Secondary | ICD-10-CM | POA: Diagnosis not present

## 2023-09-12 DIAGNOSIS — M1712 Unilateral primary osteoarthritis, left knee: Secondary | ICD-10-CM | POA: Diagnosis not present

## 2023-09-12 DIAGNOSIS — R079 Chest pain, unspecified: Secondary | ICD-10-CM | POA: Diagnosis not present

## 2023-09-12 DIAGNOSIS — Z853 Personal history of malignant neoplasm of breast: Secondary | ICD-10-CM | POA: Diagnosis not present

## 2023-09-12 DIAGNOSIS — I700 Atherosclerosis of aorta: Secondary | ICD-10-CM | POA: Diagnosis not present

## 2023-09-12 DIAGNOSIS — E785 Hyperlipidemia, unspecified: Secondary | ICD-10-CM | POA: Diagnosis not present

## 2023-09-12 DIAGNOSIS — Z87891 Personal history of nicotine dependence: Secondary | ICD-10-CM | POA: Diagnosis not present

## 2023-09-12 DIAGNOSIS — I5042 Chronic combined systolic (congestive) and diastolic (congestive) heart failure: Secondary | ICD-10-CM | POA: Diagnosis not present

## 2023-09-12 DIAGNOSIS — J449 Chronic obstructive pulmonary disease, unspecified: Secondary | ICD-10-CM | POA: Diagnosis not present

## 2023-09-12 DIAGNOSIS — D631 Anemia in chronic kidney disease: Secondary | ICD-10-CM | POA: Diagnosis not present

## 2023-09-12 DIAGNOSIS — Z9889 Other specified postprocedural states: Secondary | ICD-10-CM | POA: Diagnosis not present

## 2023-09-12 DIAGNOSIS — N182 Chronic kidney disease, stage 2 (mild): Secondary | ICD-10-CM | POA: Diagnosis not present

## 2023-09-12 DIAGNOSIS — I480 Paroxysmal atrial fibrillation: Secondary | ICD-10-CM | POA: Diagnosis not present

## 2023-09-12 DIAGNOSIS — Z91198 Patient's noncompliance with other medical treatment and regimen for other reason: Secondary | ICD-10-CM | POA: Diagnosis not present

## 2023-09-12 DIAGNOSIS — Z7901 Long term (current) use of anticoagulants: Secondary | ICD-10-CM | POA: Diagnosis not present

## 2023-09-12 DIAGNOSIS — I2510 Atherosclerotic heart disease of native coronary artery without angina pectoris: Secondary | ICD-10-CM | POA: Diagnosis not present

## 2023-09-12 DIAGNOSIS — G4733 Obstructive sleep apnea (adult) (pediatric): Secondary | ICD-10-CM | POA: Diagnosis not present

## 2023-09-12 DIAGNOSIS — K219 Gastro-esophageal reflux disease without esophagitis: Secondary | ICD-10-CM | POA: Diagnosis not present

## 2023-09-12 DIAGNOSIS — G43909 Migraine, unspecified, not intractable, without status migrainosus: Secondary | ICD-10-CM | POA: Diagnosis not present

## 2023-09-12 DIAGNOSIS — Z79899 Other long term (current) drug therapy: Secondary | ICD-10-CM | POA: Diagnosis not present

## 2023-09-12 LAB — BASIC METABOLIC PANEL
Anion gap: 7 (ref 5–15)
BUN: 23 mg/dL (ref 8–23)
CO2: 23 mmol/L (ref 22–32)
Calcium: 8.3 mg/dL — ABNORMAL LOW (ref 8.9–10.3)
Chloride: 105 mmol/L (ref 98–111)
Creatinine, Ser: 1.07 mg/dL — ABNORMAL HIGH (ref 0.44–1.00)
GFR, Estimated: 56 mL/min — ABNORMAL LOW (ref >60)
Glucose, Bld: 142 mg/dL — ABNORMAL HIGH (ref 70–99)
Potassium: 4.2 mmol/L (ref 3.5–5.1)
Sodium: 135 mmol/L (ref 135–145)

## 2023-09-12 LAB — CBC
HCT: 35 % — ABNORMAL LOW (ref 36.0–46.0)
Hemoglobin: 12.1 g/dL (ref 12.0–15.0)
MCH: 30.8 pg (ref 26.0–34.0)
MCHC: 34.6 g/dL (ref 30.0–36.0)
MCV: 89.1 fL (ref 80.0–100.0)
Platelets: 167 10*3/uL (ref 150–400)
RBC: 3.93 MIL/uL (ref 3.87–5.11)
RDW: 13 % (ref 11.5–15.5)
WBC: 12 10*3/uL — ABNORMAL HIGH (ref 4.0–10.5)
nRBC: 0 % (ref 0.0–0.2)

## 2023-09-12 MED ORDER — ROSUVASTATIN CALCIUM 20 MG PO TABS
ORAL_TABLET | ORAL | Status: AC
  Filled 2023-09-12: qty 2

## 2023-09-12 MED ORDER — ONDANSETRON HCL 4 MG PO TABS: 4.0000 mg | ORAL_TABLET | Freq: Four times a day (QID) | ORAL | 0 refills | Status: AC | PRN

## 2023-09-12 MED ORDER — CELECOXIB 100 MG PO CAPS: 100.0000 mg | ORAL_CAPSULE | Freq: Every day | ORAL | 0 refills | Status: AC

## 2023-09-12 MED ORDER — DOCUSATE SODIUM 100 MG PO CAPS: 100.0000 mg | ORAL_CAPSULE | Freq: Two times a day (BID) | ORAL | 0 refills | Status: DC

## 2023-09-12 MED ORDER — OXYCODONE HCL 5 MG PO TABS
2.5000 mg | ORAL_TABLET | Freq: Three times a day (TID) | ORAL | 0 refills | Status: DC | PRN
Start: 2023-09-12 — End: 2023-10-21

## 2023-09-12 MED ORDER — TRAMADOL HCL 50 MG PO TABS
50.0000 mg | ORAL_TABLET | Freq: Four times a day (QID) | ORAL | 0 refills | Status: DC | PRN
Start: 2023-09-12 — End: 2023-10-22

## 2023-09-12 MED ORDER — APIXABAN 2.5 MG PO TABS
ORAL_TABLET | ORAL | Status: AC
  Filled 2023-09-12: qty 2

## 2023-09-12 MED ORDER — ACETAMINOPHEN 500 MG PO TABS
1000.0000 mg | ORAL_TABLET | Freq: Three times a day (TID) | ORAL | 0 refills | Status: AC
Start: 2023-09-12 — End: ?

## 2023-09-12 NOTE — Progress Notes (Signed)
 Subjective: 1 Day Post-Op Procedure(s) (LRB): ARTHROPLASTY, KNEE, TOTAL (Left) Patient reports pain as mild.   Patient is well, but has had some minor complaints of headaches.  No vision changes chest pain or shortness of breath.  No weakness.  Blood pressure has been a little high but she has not had blood pressure medications this morning.  Overall pain well-controlled and she is doing well We will continue therapy today.  Plan is to go Home after hospital stay.  Objective: Vital signs in last 24 hours: Temp:  [96.9 F (36.1 C)-98.3 F (36.8 C)] 98.1 F (36.7 C) (03/04 0725) Pulse Rate:  [51-65] 54 (03/04 0725) Resp:  [15-20] 15 (03/04 0725) BP: (142-187)/(56-87) 155/79 (03/04 0725) SpO2:  [91 %-100 %] 93 % (03/04 0725) Weight:  [110.7 kg] 110.7 kg (03/03 0902)  Intake/Output from previous day: 03/03 0701 - 03/04 0700 In: 1221 [P.O.:240; I.V.:460.8; IV Piggyback:520.1] Out: 250 [Urine:200; Blood:50] Intake/Output this shift: No intake/output data recorded.  Recent Labs    09/12/23 0556  HGB 12.1   Recent Labs    09/12/23 0556  WBC 12.0*  RBC 3.93  HCT 35.0*  PLT 167   Recent Labs    09/12/23 0556  NA 135  K 4.2  CL 105  CO2 23  BUN 23  CREATININE 1.07*  GLUCOSE 142*  CALCIUM 8.3*   No results for input(s): "LABPT", "INR" in the last 72 hours.  EXAM General - Patient is Alert, Appropriate, and Oriented Extremity - Neurovascular intact Sensation intact distally Intact pulses distally Dorsiflexion/Plantar flexion intact Dressing - dressing C/D/I and no drainage Motor Function - intact, moving foot and toes well on exam.   Past Medical History:  Diagnosis Date   Achilles tendinitis    Acute medial meniscal tear    Allergy    Anemia    Anxiety    a.) on BZO PRN (alprazolam)   Aortic atherosclerosis (HCC)    Ascending aorta dilatation (HCC) 10/20/2022   a.) TTE 10/20/2022: asc Ao 39 mm   Atrial fibrillation/flutter (HCC) 07/2016   a.) Dx'd  07/2016; b.) CHA2DS2-VASc = 5 (age, sex, HFmrEF, HTN, vasc disease history) as of 05/09/2023; c.) s/p DCCV 09/07/2016 (150 J x 2 --> A.flutter; 200 J x 1 --> NSR); d.) s/p DCCV 10/19/2016 (150 J x 1 --> NSR); e.) brought in for DCCV 12/2016; cancelled d/t NSR; f.) s/p PVI ablation 12/12/2022; g.) cardiac rate/rhythm maintained on oral bisoprolol; chronically anticoagulated using apixaban   BMI 50.0-59.9, adult (HCC) 07/16/2018   CAD (coronary artery disease)    a.) cCTA 12/08/2022: Ca2+ = 31.6 (58th %'ile for age/sex/race match control)   Cardiomegaly    Chemotherapy induced nausea and vomiting 05/18/2021   Chronic heart failure with mid-range ejection fraction (HFmrEF) (HCC)    a.) TTE 03/08/21: EF 60-65, mild LVH, G1DD, mild LAE, triv MR; b.) TTE 07/29/21: EF 60-65, G1DD, mild LAE, triv MR; c.) TTE 11/26/21: EF 55-60, G1DD, mild LAE, mild MR; d.) TTE 02/25/22: EF 60-65, G1DD, mild LAE, mild MR; e.) TTE 05/31/22: EF 55-60, mild LVH, G2DD, mild-mod MR; f.) TTE 10/20/22: EF 45-50, mild LAE, mod red RVSF, mild-mod TR, Asc Ao 39; g.) TTE 12/13/22: EF 50-55, mild LVH, mild MAC   CKD (chronic kidney disease), stage II    Colon polyp    Complication of anesthesia    a.) emergence delirium; b.) hypoxia after hip replacement   COPD (chronic obstructive pulmonary disease) (HCC)    DDD (degenerative disc disease),  lumbar    a.) s/p fusion   Depression    Endometriosis    Fibrocystic breast disease    GERD (gastroesophageal reflux disease)    History of kidney stones    History of stress test    a.) MV 07/29/2016: EF 55-65%. Small, mild defect  in mid anteroseptal, apical septal, and apical locations. No ischemia-->low risk; b.) MV 10/2022: mod sized partially rev apical ant, apical septal, and apical defect. EF 52%. No signific cor Ca2+; c.) cCTA 12/08/2022: Ca2+ = 31.6 (58th %'ile).   History of tobacco use    Hyperlipidemia    Hypertension    Hypothyroidism    a.) s/p partial thyroidectomy for nodule;  pathology benign   Insomnia    Left knee pain, unspecified chronicity    Lipoma    Malignant neoplasm of upper-outer quadrant of right female breast (HCC) 03/03/2021   a.) stage 1A invasive mammary carcinoma (cT1b or T1c N0); ER (-), PR (+), HER2/neu (+); did not tolerate adjuvant chemotherapy (paclitacel + trastuzumab) despite paclitaxel dose reduction; s/p adjuvant XRT; cinished maintenance trastuzumab 07/27/2022; no adjuvant endocrine therapy as no clear benefit   Migraine    Nephrolithiasis    On apixaban therapy    OSA (obstructive sleep apnea)    a.) non-compliant with nocturnal PAP therapy   Osteoarthritis    Osteopenia    Peptic ulcer disease    a.) H. pylori   Pericarditis 12/12/2022   a.) following A.fib ablation; resolved   Pneumonia    Port-A-Cath in place    RLS (restless legs syndrome)    a.) on ropinirole   Vitamin D deficiency     Assessment/Plan:   1 Day Post-Op Procedure(s) (LRB): ARTHROPLASTY, KNEE, TOTAL (Left) Principal Problem:   S/P TKR (total knee replacement), left  Estimated body mass index is 40.6 kg/m as calculated from the following:   Height as of this encounter: 5\' 5"  (1.651 m).   Weight as of this encounter: 110.7 kg. Advance diet Up with therapy Pain well-controlled Labs are stable Blood pressures slightly elevated, will give BP medications this morning and continue to monitor blood pressure. Care management to assist with discharge.  Will continue to monitor vital signs and check progress with PT.  Possible discharge to home this afternoon.  DVT Prophylaxis - TED hose and Eliquis , SCDs Weight-Bearing as tolerated to left leg   T. Cranston Neighbor, PA-C [facility] 09/12/2023, 8:17 AM

## 2023-09-12 NOTE — Anesthesia Postprocedure Evaluation (Signed)
 Anesthesia Post Note  Patient: Amber Barnes  Procedure(s) Performed: ARTHROPLASTY, KNEE, TOTAL (Left: Knee)  Patient location during evaluation: Nursing Unit Anesthesia Type: Spinal Level of consciousness: oriented and awake and alert Pain management: pain level controlled Vital Signs Assessment: post-procedure vital signs reviewed and stable Respiratory status: spontaneous breathing and respiratory function stable Cardiovascular status: blood pressure returned to baseline and stable Postop Assessment: no headache, no backache, no apparent nausea or vomiting and patient able to bend at knees Anesthetic complications: no   No notable events documented.   Last Vitals:  Vitals:   09/12/23 0553 09/12/23 0725  BP: (!) 184/81 (!) 155/79  Pulse: 65 (!) 54  Resp:  15  Temp:  36.7 C  SpO2:  93%    Last Pain:  Vitals:   09/12/23 0725  TempSrc: Oral  PainSc:                  Starling Manns

## 2023-09-12 NOTE — Discharge Instructions (Signed)
Instructions after Total Knee Replacement   Amber Barnes M.D.     Dept. of [facility]  [address] [place], [place]  [address]  Phone: [phone]   Fax: [phone]    DIET: Drink plenty of non-alcoholic fluids. Resume your normal diet. Include foods high in fiber.  ACTIVITY:  You may use crutches or a walker with weight-bearing as tolerated, unless instructed otherwise. You may be weaned off of the walker or crutches by your Physical Therapist.  Do NOT place pillows under the knee. Anything placed under the knee could limit your ability to straighten the knee.   Continue doing gentle exercises. Exercising will reduce the pain and swelling, increase motion, and prevent muscle weakness.   Please continue to use the TED compression stockings for 2 weeks. You may remove the stockings at night, but should reapply them in the morning. Do not drive or operate any equipment until instructed.  WOUND CARE:  Continue to use the PolarCare or ice packs periodically to reduce pain and swelling. You may begin showering 3 days after surgery with honeycomb dressing. Remove honeycomb dressing 7 days after surgery and continue showering. Allow dermabond to fall off on its own.  MEDICATIONS: You may resume your regular medications. Please take the pain medication as prescribed on the medication. Do not take pain medication on an empty stomach. Continue with Eliquis  Do not drive or drink alcoholic beverages when taking pain medications.  POSTOPERATIVE CONSTIPATION PROTOCOL Constipation - defined medically as fewer than three stools per week and severe constipation as less than one stool per week.  One of the most common issues patients have following surgery is constipation.  Even if you have a regular bowel pattern at home, your normal regimen is likely to be disrupted due to multiple reasons following surgery.  Combination of anesthesia,  postoperative narcotics, change in appetite and fluid intake all can affect your bowels.  In order to avoid complications following surgery, here are some recommendations in order to help you during your recovery period.  Colace (docusate) - Pick up an over-the-counter form of Colace or another stool softener and take twice a day as long as you are requiring postoperative pain medications.  Take with a full glass of water daily.  If you experience loose stools or diarrhea, hold the colace until you stool forms back up.  If your symptoms do not get better within 1 week or if they get worse, check with your doctor.  Dulcolax (bisacodyl) - Pick up over-the-counter and take as directed by the product packaging as needed to assist with the movement of your bowels.  Take with a full glass of water.  Use this product as needed if not relieved by Colace only.   MiraLax (polyethylene glycol) - Pick up over-the-counter to have on hand.  MiraLax is a solution that will increase the amount of water in your bowels to assist with bowel movements.  Take as directed and can mix with a glass of water, juice, soda, coffee, or tea.  Take if you go more than two days without a movement. Do not use MiraLax more than once per day. Call your doctor if you are still constipated or irregular after using this medication for 7 days in a row.  If you continue to have problems with postoperative constipation, please contact the office for further assistance and recommendations.  If you experience "the worst abdominal pain ever" or develop nausea or  vomiting, please contact the office immediatly for further recommendations for treatment.   CALL THE OFFICE FOR: Temperature above 101 degrees Excessive bleeding or drainage on the dressing. Excessive swelling, coldness, or paleness of the toes. Persistent nausea and vomiting.  FOLLOW-UP:  You should have an appointment to return to the office in 14 days after  surgery. Arrangements have been made for continuation of Physical Therapy (either home therapy or outpatient therapy).

## 2023-09-12 NOTE — Plan of Care (Signed)
   Problem: Activity: Goal: Ability to avoid complications of mobility impairment will improve Outcome: Progressing   Problem: Pain Management: Goal: Pain level will decrease with appropriate interventions Outcome: Progressing

## 2023-09-12 NOTE — Progress Notes (Signed)
 DISCHARGE NOTE:   Pt dc with IV removed and dc instructions given. Pt voices no questions or concerns at this time. Pt has both TED hose on and in place. Pt wheeled down to medical mall entrance by staff and pt's daughter in law provided transportation.

## 2023-09-12 NOTE — Discharge Summary (Signed)
 Physician Discharge Summary  Patient ID: Amber Barnes MRN: [id number] DOB/AGE: [birth year] 73 y.o.  Admit date: 09/11/2023 Discharge date: 09/12/2023  Admission Diagnoses:  S/P TKR (total knee replacement), left [Z96.652]   Discharge Diagnoses: Patient Active Problem List   Diagnosis Date Noted   S/P TKR (total knee replacement), left 09/11/2023   Hip mass, left 07/18/2023   S/P total left hip arthroplasty 05/11/2023   Breast mass, right 12/13/2022   Myocardial injury 12/13/2022   Peptic ulcer 12/13/2022   Obesity, Class III, BMI 40-49.9 (morbid obesity) (HCC) 12/13/2022   PAF (paroxysmal atrial fibrillation) (HCC) 12/13/2022   Pericarditis 12/13/2022   Elevated TSH 09/12/2022   Basal cell carcinoma 08/10/2022   Hypocalcemia 03/23/2022   Primary localized osteoarthritis of pelvic region and thigh 03/02/2022   Displacement of lumbar intervertebral disc without myelopathy 03/02/2022   Left hip pain 01/05/2022   Dizziness 12/10/2021   Chronic pain of left knee 11/16/2021   Leg swelling 10/27/2021   Encounter for monoclonal antibody treatment for malignancy 09/29/2021   Ureterovesical junction (UVJ) obstruction 07/12/2021   Acute pyelonephritis 07/12/2021   CKD (chronic kidney disease), stage II 07/12/2021   HLD (hyperlipidemia) 07/12/2021   Chronic diastolic CHF (congestive heart failure) (HCC) 07/12/2021   Hydroureteronephrosis 07/12/2021   Pyelonephritis 07/02/2021   Drug-induced neutropenia (HCC) 06/23/2021   Chemotherapy induced diarrhea 06/09/2021   Anemia due to antineoplastic chemotherapy 05/18/2021   Chemotherapy induced nausea and vomiting 05/18/2021   COVID-19 04/30/2021   Neutropenia (HCC)    Intractable vomiting    Diarrhea    Neutropenic fever (HCC) 04/05/2021   Encounter for antineoplastic chemotherapy 03/24/2021   Neuropathy 03/24/2021   Port-A-Cath in place 03/24/2021   HER2-positive carcinoma of breast (HCC) 01/26/2021   Goals of care,  counseling/discussion 01/26/2021   Malignant neoplasm of upper-outer quadrant of right female breast (HCC) 01/19/2021   Left arm pain 12/02/2020   Soft tissue mass 12/02/2020   Candidal intertrigo 09/09/2020   Urinary frequency 09/09/2020   Night sweat 03/31/2020   Family history of CLL (chronic lymphoid leukemia) 03/21/2020   Nasal drainage 12/18/2019   Sinus congestion 12/18/2019   Nausea 10/30/2019   Major depression in remission (HCC) 08/27/2018   Primary osteoarthritis involving multiple joints 08/27/2018   COPD (chronic obstructive pulmonary disease) (HCC) 08/27/2018   Obesity (BMI 35.0-39.9 without comorbidity) 05/31/2018   Bradycardia 05/22/2017   Restless leg syndrome 04/13/2017   OSA (obstructive sleep apnea) 01/09/2017   Hypokalemia 07/28/2016   Atypical chest pain    Atrial fibrillation with RVR (HCC) 07/27/2016   Fatty liver disease, nonalcoholic 06/30/2016   Vertigo 78/29/5621   Family history of ovarian cancer 06/08/2016   GERD (gastroesophageal reflux disease) 05/05/2016   HTN (hypertension) 11/11/2014   Anxiety and depression 11/11/2014   Weight loss 11/11/2014    Past Medical History:  Diagnosis Date   Achilles tendinitis    Acute medial meniscal tear    Allergy    Anemia    Anxiety    a.) on BZO PRN (alprazolam)   Aortic atherosclerosis (HCC)    Ascending aorta dilatation (HCC) 10/20/2022   a.) TTE 10/20/2022: asc Ao 39 mm   Atrial fibrillation/flutter (HCC) 07/2016   a.) Dx'd 07/2016; b.) CHA2DS2-VASc = 5 (age, sex, HFmrEF, HTN, vasc disease history) as of 05/09/2023; c.) s/p DCCV 09/07/2016 (150 J x 2 --> A.flutter; 200 J x 1 --> NSR); d.) s/p DCCV 10/19/2016 (150 J x 1 --> NSR); e.) brought in for DCCV 12/2016; cancelled d/t  NSR; f.) s/p PVI ablation 12/12/2022; g.) cardiac rate/rhythm maintained on oral bisoprolol; chronically anticoagulated using apixaban   BMI 50.0-59.9, adult (HCC) 07/16/2018   CAD (coronary artery disease)    a.) cCTA  12/08/2022: Ca2+ = 31.6 (58th %'ile for age/sex/race match control)   Cardiomegaly    Chemotherapy induced nausea and vomiting 05/18/2021   Chronic heart failure with mid-range ejection fraction (HFmrEF) (HCC)    a.) TTE 03/08/21: EF 60-65, mild LVH, G1DD, mild LAE, triv MR; b.) TTE 07/29/21: EF 60-65, G1DD, mild LAE, triv MR; c.) TTE 11/26/21: EF 55-60, G1DD, mild LAE, mild MR; d.) TTE 02/25/22: EF 60-65, G1DD, mild LAE, mild MR; e.) TTE 05/31/22: EF 55-60, mild LVH, G2DD, mild-mod MR; f.) TTE 10/20/22: EF 45-50, mild LAE, mod red RVSF, mild-mod TR, Asc Ao 39; g.) TTE 12/13/22: EF 50-55, mild LVH, mild MAC   CKD (chronic kidney disease), stage II    Colon polyp    Complication of anesthesia    a.) emergence delirium; b.) hypoxia after hip replacement   COPD (chronic obstructive pulmonary disease) (HCC)    DDD (degenerative disc disease), lumbar    a.) s/p fusion   Depression    Endometriosis    Fibrocystic breast disease    GERD (gastroesophageal reflux disease)    History of kidney stones    History of stress test    a.) MV 07/29/2016: EF 55-65%. Small, mild defect  in mid anteroseptal, apical septal, and apical locations. No ischemia-->low risk; b.) MV 10/2022: mod sized partially rev apical ant, apical septal, and apical defect. EF 52%. No signific cor Ca2+; c.) cCTA 12/08/2022: Ca2+ = 31.6 (58th %'ile).   History of tobacco use    Hyperlipidemia    Hypertension    Hypothyroidism    a.) s/p partial thyroidectomy for nodule; pathology benign   Insomnia    Left knee pain, unspecified chronicity    Lipoma    Malignant neoplasm of upper-outer quadrant of right female breast (HCC) 03/03/2021   a.) stage 1A invasive mammary carcinoma (cT1b or T1c N0); ER (-), PR (+), HER2/neu (+); did not tolerate adjuvant chemotherapy (paclitacel + trastuzumab) despite paclitaxel dose reduction; s/p adjuvant XRT; cinished maintenance trastuzumab 07/27/2022; no adjuvant endocrine therapy as no clear benefit    Migraine    Nephrolithiasis    On apixaban therapy    OSA (obstructive sleep apnea)    a.) non-compliant with nocturnal PAP therapy   Osteoarthritis    Osteopenia    Peptic ulcer disease    a.) H. pylori   Pericarditis 12/12/2022   a.) following A.fib ablation; resolved   Pneumonia    Port-A-Cath in place    RLS (restless legs syndrome)    a.) on ropinirole   Vitamin D deficiency      Transfusion: none   Consultants (if any):   Discharged Condition: Improved  Hospital Course: Amber Barnes is an 73 y.o. female who was admitted 09/11/2023 with a diagnosis of S/P TKR (total knee replacement), left and went to the operating room on 09/11/2023 and underwent the above named procedures.    Surgeries: Procedure(s): ARTHROPLASTY, KNEE, TOTAL on 09/11/2023 Patient tolerated the surgery well. Taken to PACU where she was stabilized and then transferred to the orthopedic floor.  Started on Eliquis TEDs and SCDs applied bilaterally. Heels elevated on bed. No evidence of DVT. Negative Homan. Physical therapy started on day #1 for gait training and transfer. OT started day #1 for ADL and assisted devices.  Patient's  IV was d/c on day #1. Patient was able to safely and independently complete all PT goals. PT recommending discharge to home.    Patient with slightly elevated blood pressure on postop day 1, blood pressure medications were given and blood pressure improved.  Patient asymptomatic.  On post op day #1 patient was stable and ready for discharge to home .  Implants: Femur: Persona Size 7 CR   Tibia: Persona Size E w/ 14x1mm stem extension  Poly: 10mm MC  Patella: 32x8.39mm symmetric   She was given perioperative antibiotics:  Anti-infectives (From admission, onward)    Start     Dose/Rate Route Frequency Ordered Stop   09/11/23 1700  ceFAZolin (ANCEF) IVPB 2g/100 mL premix        2 g 200 mL/hr over 30 Minutes Intravenous Every 6 hours 09/11/23 1423 09/12/23 0012   09/11/23 0600   ceFAZolin (ANCEF) IVPB 2g/100 mL premix        2 g 200 mL/hr over 30 Minutes Intravenous On call to O.R. 09/10/23 2343 09/11/23 1050     .  She was given sequential compression devices, early ambulation, and Eliquis TEDs for DVT prophylaxis.  She benefited maximally from the hospital stay and there were no complications.    Recent vital signs:  Vitals:   09/12/23 0725 09/12/23 1100  BP: (!) 155/79 (!) 144/78  Pulse: (!) 54 60  Resp: 15 16  Temp: 98.1 F (36.7 C) 98 F (36.7 C)  SpO2: 93%     Recent laboratory studies:  Lab Results  Component Value Date   HGB 12.1 09/12/2023   HGB 13.4 08/25/2023   HGB 13.2 08/17/2023   Lab Results  Component Value Date   WBC 12.0 (H) 09/12/2023   PLT 167 09/12/2023   Lab Results  Component Value Date   INR 1.2 12/13/2022   Lab Results  Component Value Date   NA 135 09/12/2023   K 4.2 09/12/2023   CL 105 09/12/2023   CO2 23 09/12/2023   BUN 23 09/12/2023   CREATININE 1.07 (H) 09/12/2023   GLUCOSE 142 (H) 09/12/2023    Discharge Medications:   Allergies as of 09/12/2023       Reactions   Hydrocodone    Prednisone Other (See Comments)   Agitation, hyperactivity, polyphagia   Amiodarone Nausea And Vomiting, Other (See Comments)   Hydrocodone-acetaminophen Other (See Comments)   Hallucinations and combative   Tapentadol Itching   Adhesive [tape] Itching, Rash   Prefers paper tape   Latex Itching, Rash   use paper tap   Oxycodone Itching   Can take it with benadryl   Tramadol Itching   Can take it with benadryl   Wound Dressing Adhesive         Medication List     TAKE these medications    acetaminophen 500 MG tablet Commonly known as: TYLENOL Take 2 tablets (1,000 mg total) by mouth every 8 (eight) hours. What changed:  when to take this reasons to take this additional instructions   ALPRAZolam 0.25 MG tablet Commonly known as: XANAX TAKE 1 TABLET BY MOUTH TWICE A DAY AS NEEDED FOR ANXIETY    bisoprolol 10 MG tablet Commonly known as: ZEBETA Take 1 tablet (10 mg total) by mouth 2 (two) times daily.   busPIRone 5 MG tablet Commonly known as: BUSPAR TAKE 1 TABLET BY MOUTH THREE TIMES A DAY AS NEEDED What changed:  when to take this reasons to take this   CALCIUM  500 PO Take 1 tablet by mouth daily.   celecoxib 100 MG capsule Commonly known as: CeleBREX Take 1 capsule (100 mg total) by mouth daily for 7 days.   docusate sodium 100 MG capsule Commonly known as: COLACE Take 1 capsule (100 mg total) by mouth 2 (two) times daily.   Eliquis 5 MG Tabs tablet Generic drug: apixaban TAKE 1 TABLET BY MOUTH TWICE A DAY   escitalopram 10 MG tablet Commonly known as: LEXAPRO Take 1 tablet (10 mg total) by mouth daily.   furosemide 20 MG tablet Commonly known as: LASIX Take 1 tablet (20 mg total) by mouth daily. May take additional 20mg  PO prn for leg swelling   losartan-hydrochlorothiazide 50-12.5 MG tablet Commonly known as: HYZAAR Take 0.5 tablets by mouth daily as needed (for BP 140/80).   multivitamin tablet Take 1 tablet by mouth daily.   ondansetron 4 MG tablet Commonly known as: ZOFRAN Take 1 tablet (4 mg total) by mouth every 6 (six) hours as needed for nausea.   oxyCODONE 5 MG immediate release tablet Commonly known as: Roxicodone Take 0.5-1 tablets (2.5-5 mg total) by mouth every 8 (eight) hours as needed.   potassium chloride SA 20 MEQ tablet Commonly known as: KLOR-CON M Take 1 tablet (20 mEq total) by mouth daily. What changed:  when to take this additional instructions   ranolazine 500 MG 12 hr tablet Commonly known as: RANEXA TAKE 1 TABLET BY MOUTH TWICE A DAY   rOPINIRole 2 MG tablet Commonly known as: REQUIP Take 2 mg by mouth at bedtime.   rosuvastatin 40 MG tablet Commonly known as: CRESTOR TAKE 1 TABLET BY MOUTH EVERY DAY   traMADol 50 MG tablet Commonly known as: ULTRAM Take 1 tablet (50 mg total) by mouth every 6 (six) hours  as needed for moderate pain (pain score 4-6).   Vitamin D3 125 MCG (5000 UT) Caps Take 5,000 Units by mouth daily.        Diagnostic Studies: DG Knee Left Port Result Date: 09/11/2023 CLINICAL DATA:  Status post total left knee arthroplasty. EXAM: PORTABLE LEFT KNEE - 1-2 VIEW COMPARISON:  Left knee radiographs 04/13/2017 FINDINGS: Interval total left knee arthroplasty. No perihardware lucency is seen to indicate hardware failure or loosening. Expected postoperative changes including intra-articular and subcutaneous air. Smalljoint effusion. No acute fracture or dislocation. IMPRESSION: Interval total left knee arthroplasty without evidence of hardware failure. Electronically Signed   By: Neita Garnet M.D.   On: 09/11/2023 14:32   ECHOCARDIOGRAM COMPLETE Result Date: 08/26/2023    ECHOCARDIOGRAM REPORT   Patient Name:   ZARIANNA DICARLO Date of Exam: 08/26/2023 Medical Rec #:  [id number]        Height:       65.0 in Accession #:    [id number]       Weight:       245.0 lb Date of Birth:  Aug 07, 1950        BSA:          2.156 m Patient Age:    72 years         BP:           104/91 mmHg Patient Gender: F                HR:           55 bpm. Exam Location:  [facility] Procedure: 2D Echo, Cardiac Doppler and Color Doppler (Both Spectral and Color  Flow Doppler were utilized during procedure). Indications:     Chest Pain  History:         Patient has prior history of Echocardiogram examinations, most                  recent 12/13/2022. CKD 2 and COPD, Arrythmias:Atrial                  Fibrillation; Risk Factors:Hypertension, Dyslipidemia, Former                  Smoker and Sleep Apnea.  Sonographer:     Dondra Prader RVT RCS Referring Phys:  [id number] Francoise Schaumann [place] Diagnosing Phys: Julien Nordmann MD IMPRESSIONS  1. Left ventricular ejection fraction, by estimation, is 60 to 65%. The left ventricle has normal function. The left ventricle has no regional wall motion abnormalities. Left ventricular diastolic  parameters were normal.  2. Right ventricular systolic function is normal. The right ventricular size is normal. There is normal pulmonary artery systolic pressure. The estimated right ventricular systolic pressure is 25.6 mmHg.  3. The mitral valve is normal in structure. Mild mitral valve regurgitation. No evidence of mitral stenosis.  4. The aortic valve is normal in structure. Aortic valve regurgitation is not visualized. No aortic stenosis is present. Aortic valve mean gradient measures 5.0 mmHg.  5. The inferior vena cava is normal in size with greater than 50% respiratory variability, suggesting right atrial pressure of 3 mmHg. FINDINGS  Left Ventricle: Left ventricular ejection fraction, by estimation, is 60 to 65%. The left ventricle has normal function. The left ventricle has no regional wall motion abnormalities. The left ventricular internal cavity size was normal in size. There is  no left ventricular hypertrophy. Left ventricular diastolic parameters were normal. Right Ventricle: The right ventricular size is normal. No increase in right ventricular wall thickness. Right ventricular systolic function is normal. There is normal pulmonary artery systolic pressure. The tricuspid regurgitant velocity is 2.27 m/s, and  with an assumed right atrial pressure of 5 mmHg, the estimated right ventricular systolic pressure is 25.6 mmHg. Left Atrium: Left atrial size was normal in size. Right Atrium: Right atrial size was normal in size. Pericardium: There is no evidence of pericardial effusion. Mitral Valve: The mitral valve is normal in structure. Mild mitral annular calcification. Mild mitral valve regurgitation. No evidence of mitral valve stenosis. Tricuspid Valve: The tricuspid valve is normal in structure. Tricuspid valve regurgitation is mild . No evidence of tricuspid stenosis. Aortic Valve: The aortic valve is normal in structure. Aortic valve regurgitation is not visualized. No aortic stenosis is  present. Aortic valve mean gradient measures 5.0 mmHg. Aortic valve peak gradient measures 9.9 mmHg. Aortic valve area, by VTI measures 1.86 cm. Pulmonic Valve: The pulmonic valve was normal in structure. Pulmonic valve regurgitation is not visualized. No evidence of pulmonic stenosis. Aorta: The aortic root is normal in size and structure. Venous: The inferior vena cava is normal in size with greater than 50% respiratory variability, suggesting right atrial pressure of 3 mmHg. IAS/Shunts: No atrial level shunt detected by color flow Doppler. Additional Comments: 3D imaging was not performed.  LEFT VENTRICLE PLAX 2D LVIDd:         4.80 cm   Diastology LVIDs:         3.50 cm   LV e' medial:    10.10 cm/s LV PW:         1.30 cm   LV E/e' medial:  7.6  LV IVS:        1.20 cm   LV e' lateral:   9.60 cm/s LVOT diam:     1.80 cm   LV E/e' lateral: 8.0 LV SV:         68 LV SV Index:   32 LVOT Area:     2.54 cm  RIGHT VENTRICLE            IVC RV S prime:     9.49 cm/s  IVC diam: 1.80 cm TAPSE (M-mode): 2.1 cm LEFT ATRIUM             Index LA diam:        4.20 cm 1.95 cm/m LA Vol (A2C):   71.7 ml 33.25 ml/m LA Vol (A4C):   69.5 ml 32.21 ml/m LA Biplane Vol: 70.3 ml 32.60 ml/m  AORTIC VALVE                     PULMONIC VALVE AV Area (Vmax):    1.84 cm      PV Vmax:          0.96 m/s AV Area (Vmean):   1.79 cm      PV Peak grad:     3.7 mmHg AV Area (VTI):     1.86 cm      PR End Diast Vel: 7.29 msec AV Vmax:           157.00 cm/s AV Vmean:          109.000 cm/s AV VTI:            0.367 m AV Peak Grad:      9.9 mmHg AV Mean Grad:      5.0 mmHg LVOT Vmax:         113.50 cm/s LVOT Vmean:        76.800 cm/s LVOT VTI:          0.268 m LVOT/AV VTI ratio: 0.73  AORTA Ao Root diam: 3.20 cm Ao Asc diam:  3.20 cm MITRAL VALVE               TRICUSPID VALVE MV Area (PHT): 3.91 cm    TR Peak grad:   20.6 mmHg MV Decel Time: 194 msec    TR Vmax:        227.00 cm/s MV E velocity: 77.00 cm/s MV A velocity: 73.40 cm/s  SHUNTS MV E/A  ratio:  1.05        Systemic VTI:  0.27 m                            Systemic Diam: 1.80 cm Julien Nordmann MD Electronically signed by Julien Nordmann MD Signature Date/Time: 08/26/2023/3:12:45 PM    Final    CT HEAD WO CONTRAST ( ) Result Date: 08/25/2023 CLINICAL DATA:  Neuro deficit, acute, stroke suspected. Atrial fibrillation. Headache and visual disturbance. EXAM: CT HEAD WITHOUT CONTRAST TECHNIQUE: Contiguous axial images were obtained from the base of the skull through the vertex without intravenous contrast. RADIATION DOSE REDUCTION: This exam was performed according to the departmental dose-optimization program which includes automated exposure control, adjustment of the mA and/or kV according to patient size and/or use of iterative reconstruction technique. COMPARISON:  04/05/2021 FINDINGS: Brain: The brain shows a normal appearance without evidence of malformation, atrophy, old or acute small or large vessel infarction, mass lesion, hemorrhage, hydrocephalus or extra-axial collection. Vascular: There is atherosclerotic calcification  of the major vessels at the base of the brain. Skull: Normal.  No traumatic finding.  No focal bone lesion. Sinuses/Orbits: Sinuses are clear. Orbits appear normal. Mastoids are clear. Other: None significant IMPRESSION: No acute CT finding. Normal appearance of the brain itself. Atherosclerotic calcification of the major vessels at the base of the brain. Electronically Signed   By: Paulina Fusi M.D.   On: 08/25/2023 19:48   CT Angio Chest PE W and/or Wo Contrast Result Date: 08/25/2023 CLINICAL DATA:  Pulmonary embolism (PE) suspected, high prob. History of breast cancer. EXAM: CT ANGIOGRAPHY CHEST WITH CONTRAST TECHNIQUE: Multidetector CT imaging of the chest was performed using the standard protocol during bolus administration of intravenous contrast. Multiplanar CT image reconstructions and MIPs were obtained to evaluate the vascular anatomy. RADIATION DOSE  REDUCTION: This exam was performed according to the departmental dose-optimization program which includes automated exposure control, adjustment of the mA and/or kV according to patient size and/or use of iterative reconstruction technique. CONTRAST:  75mL OMNIPAQUE IOHEXOL 350 MG/ML SOLN COMPARISON:  CT chest dated 12/08/2022 and 03/03/2022. FINDINGS: Cardiovascular: Satisfactory opacification of the pulmonary arteries to the segmental level. No evidence of pulmonary embolism. Heart is mildly enlarged. No pericardial effusion. Scattered coronary artery calcifications. Thoracic aorta is normal in caliber with atherosclerotic calcification extending into the arch branch vessels. Mediastinum/Nodes: No enlarged mediastinal, hilar, or axillary lymph nodes. Trachea and esophagus demonstrate no significant findings. Lungs/Pleura: No focal consolidation, pleural effusion, or pneumothorax. Mild lingular and left basilar atelectasis/scarring. Similar faint ground-glass densities in the right mid lung and left apex, essentially unchanged since the prior exam dated 03/03/2022. No new or enlarging suspicious pulmonary nodule. Upper Abdomen: No acute abnormality. Musculoskeletal: No acute osseous abnormality. No suspicious osseous lesion. Review of the MIP images confirms the above findings. IMPRESSION: 1. No evidence of pulmonary embolism. 2. No acute intrathoracic findings. 3. Mild cardiomegaly. Aortic Atherosclerosis (ICD10-I70.0). Electronically Signed   By: Hart Robinsons M.D.   On: 08/25/2023 15:16   DG Chest 2 View Result Date: 08/25/2023 CLINICAL DATA:  Central chest pain EXAM: CHEST - 2 VIEW COMPARISON:  Chest radiograph dated 12/13/2022 FINDINGS: Lines/tubes: Left chest wall port tip projects over the superior cavoatrial junction. Lungs: Well inflated lungs. No focal consolidation. Pleura: No pneumothorax or pleural effusion. Heart/mediastinum: The heart size and mediastinal contours are within normal limits.  Bones: No acute osseous abnormality. IMPRESSION: No active cardiopulmonary disease. Electronically Signed   By: Agustin Cree M.D.   On: 08/25/2023 11:36    Disposition:  There are no questions and answers to display.           Follow-up Information     Evon Slack, PA-C Follow up in 2 week(s).   Specialties: Orthopedic Surgery, Emergency Medicine Contact information: [address] [place] [address] [phone]                  Signed: Evon Slack 09/12/2023, 11:54 AM

## 2023-09-12 NOTE — Evaluation (Signed)
 Occupational Therapy Evaluation Patient Details Name: Almarie Kurdziel MRN: [id number] DOB: [birth year] Today's Date: 09/12/2023   History of Present Illness   Pt is a 73 yo F diagnosed with left knee osteoarthritis and is s/p elective L TKA.  PMH includes BLE THA, COPD, HTN, A-fib, and anxiety.  Of note pt reports history of cardiac ablation procedure with baseline HR of 50-60 bpm.     Clinical Impressions Pt seen for OT evaluation this date, POD#1 from above surgery. PTA pt reports she is MOD I-I in ADL/IADL, amb with no AD household distances, [facility] community distances. Pt provided education re: in polar care mgt, falls prevention strategies, home/routines modifications, DME/AE for LB bathing and dressing tasks, and compression stocking mgt. Pt would benefit from skilled OT services including additional instruction in dressing techniques with or without assistive devices for dressing and bathing skills to support recall and carryover prior to discharge and ultimately to maximize safety, independence, and minimize falls risk and caregiver burden. Pt is left in bedside chair, all needs met. OT will follow acutely.     If plan is discharge home, recommend the following:   A little help with bathing/dressing/bathroom;Assistance with cooking/housework;Help with stairs or ramp for entrance     Functional Status Assessment   Patient has had a recent decline in their functional status and demonstrates the ability to make significant improvements in function in a reasonable and predictable amount of time.     Equipment Recommendations   Other (comment) (pt reports she has BSC and RW at home which I recommend to use for ADLs at this time)     Recommendations for Other Services         Precautions/Restrictions   Precautions Precautions: Fall Recall of Precautions/Restrictions: Intact Restrictions Weight Bearing Restrictions Per Provider Order: Yes LLE Weight Bearing Per Provider  Order: Weight bearing as tolerated     Mobility Bed Mobility               General bed mobility comments: NT in recliner pre/post session    Transfers Overall transfer level: Needs assistance Equipment used: Rolling walker (2 wheels) Transfers: Sit to/from Stand Sit to Stand: Supervision                  Balance Overall balance assessment: Needs assistance Sitting-balance support: Feet supported Sitting balance-Leahy Scale: Normal     Standing balance support: Bilateral upper extremity supported, During functional activity, Reliant on assistive device for balance Standing balance-Leahy Scale: Good                             ADL either performed or assessed with clinical judgement   ADL Overall ADL's : Needs assistance/impaired Eating/Feeding: Set up;Sitting   Grooming: Wash/dry hands;Standing;Supervision/safety Grooming Details (indicate cue type and reason): with RW at sink level         Upper Body Dressing : Set up;Sitting   Lower Body Dressing: Moderate assistance Lower Body Dressing Details (indicate cue type and reason): simulated, socks; discussed use of AE for pants/underwear, pt is familiar from previous surgeries Toilet Transfer: Supervision/safety;Rolling walker (2 wheels);BSC/3in1;Grab bars   Toileting- Clothing Manipulation and Hygiene: Supervision/safety;Sitting/lateral lean Toileting - Clothing Manipulation Details (indicate cue type and reason): continent urine on toilet     Functional mobility during ADLs: Supervision/safety;Rolling walker (2 wheels) (approx 15' two attempts, intermittent vcs for technique)       Vision Patient Visual Report: No change from baseline  Perception         Praxis         Pertinent Vitals/Pain Pain Assessment Pain Assessment: 0-10 Pain Score: 2  Pain Location: L knee Pain Descriptors / Indicators: Discomfort Pain Intervention(s): Premedicated before session, Repositioned,  Monitored during session     Extremity/Trunk Assessment Upper Extremity Assessment Upper Extremity Assessment: Overall WFL for tasks assessed   Lower Extremity Assessment Lower Extremity Assessment: LLE deficits/detail LLE Deficits / Details: s/p L TKA       Communication Communication Communication: No apparent difficulties   Cognition Arousal: Alert Behavior During Therapy: WFL for tasks assessed/performed Cognition: No apparent impairments                               Following commands: Intact       Cueing  General Comments   Cueing Techniques: Verbal cues;Gestural cues;Visual cues      Exercises     Shoulder Instructions      Home Living Family/patient expects to be discharged to:: Private residence Living Arrangements: Spouse/significant other Available Help at Discharge: Family;Available 24 hours/day (husband, step daugther, grand daughter) Type of Home: Apartment Home Access: Level entry     Home Layout: One level     Bathroom Shower/Tub: Producer, television/film/video: Standard     Home Equipment: Agricultural consultant (2 wheels);Rollator (4 wheels);BSC/3in1;Wheelchair - power;Cane - single point   Additional Comments: reacher/sock aid; pt report family can assist as needed      Prior Functioning/Environment Prior Level of Function : Independent/Modified Independent             Mobility Comments: amb with no AD household distances, [facility] community distances ADLs Comments: MOD I-I in ADL/IADL    OT Problem List: Decreased strength;Decreased activity tolerance;Decreased knowledge of use of DME or AE   OT Treatment/Interventions: Self-care/ADL training;DME and/or AE instruction;Therapeutic activities;Balance training;Therapeutic exercise;Patient/family education      OT Goals(Current goals can be found in the care plan section)   Acute Rehab OT Goals Patient Stated Goal: go home OT Goal Formulation: With patient Time For  Goal Achievement: 09/26/23 Potential to Achieve Goals: Good ADL Goals Pt Will Perform Grooming: with modified independence;sitting;standing Pt Will Perform Lower Body Dressing: with modified independence;sitting/lateral leans;sit to/from stand Pt Will Transfer to Toilet: with modified independence;ambulating Pt Will Perform Toileting - Clothing Manipulation and hygiene: with modified independence;sitting/lateral leans;sit to/from stand   OT Frequency:  Min 2X/week    Co-evaluation              AM-PAC OT "6 Clicks" Daily Activity     Outcome Measure Help from another person eating meals?: None Help from another person taking care of personal grooming?: None Help from another person toileting, which includes using toliet, bedpan, or urinal?: None Help from another person bathing (including washing, rinsing, drying)?: A Little Help from another person to put on and taking off regular upper body clothing?: None Help from another person to put on and taking off regular lower body clothing?: A Little 6 Click Score: 22   End of Session Equipment Utilized During Treatment: Rolling walker (2 wheels) Nurse Communication: Mobility status  Activity Tolerance: Patient tolerated treatment well Patient left: in chair;with call bell/phone within reach  OT Visit Diagnosis: Other abnormalities of gait and mobility (R26.89)                Time: 4010-2725 OT Time Calculation (min): 17  min Charges:  OT General Charges $OT Visit: 1 Visit OT Evaluation $OT Eval Low Complexity: 1 Low  Oleta Mouse, OTD OTR/L  09/12/23, 10:56 AM

## 2023-09-12 NOTE — Progress Notes (Signed)
 Physical Therapy Treatment Patient Details Name: Amber Barnes MRN: [id number] DOB: 05-12-1951 Today's Date: 09/12/2023   History of Present Illness Pt is a 73 yo F diagnosed with left knee osteoarthritis and is s/p elective L TKA.  PMH includes BLE THA, COPD, HTN, A-fib, and anxiety.  Of note pt reports history of cardiac ablation procedure with baseline HR of 50-60 bpm.    PT Comments  Pt was pleasant and motivated to participate during the session and put forth good effort throughout. Pt required no physical assistance during the session and presented with very good control and stability throughout.  Pt's gait pattern quickly progressed to step-through with improved cadence and confidence compared to prior session and pt was able to ascend/descend stairs with good carryover of proper sequencing.  Pt reported no adverse symptoms during the session other than mod L knee pain.  Pt will benefit from continued PT services upon discharge to safely address deficits listed in patient problem list for decreased caregiver assistance and eventual return to PLOF.       If plan is discharge home, recommend the following: A little help with walking and/or transfers;A little help with bathing/dressing/bathroom;Assistance with cooking/housework;Assist for transportation   Can travel by private vehicle        Equipment Recommendations  None recommended by PT    Recommendations for Other Services       Precautions / Restrictions Precautions Precautions: Fall Recall of Precautions/Restrictions: Intact Restrictions Weight Bearing Restrictions Per Provider Order: Yes LLE Weight Bearing Per Provider Order: Weight bearing as tolerated     Mobility  Bed Mobility               General bed mobility comments: NT in recliner pre/post session    Transfers Overall transfer level: Needs assistance Equipment used: Rolling walker (2 wheels) Transfers: Sit to/from Stand Sit to Stand:  Supervision           General transfer comment: Min verbal and visual cues for sequencing most notably for hand and L foot positioning    Ambulation/Gait Ambulation/Gait assistance: Contact guard assist Gait Distance (Feet): 20 Feet x 1, 150 Feet x 1 Assistive device: Rolling walker (2 wheels) Gait Pattern/deviations: Step-to pattern, Decreased stance time - left, Decreased step length - right, Step-through pattern, Antalgic Gait velocity: decreased     General Gait Details: Step-to sequencing that quickly progressed to step-through with only mildly antalgic pattern on the LLE; steady throughout with no overt LOB or signs of L knee buckling/instability   Stairs Stairs: Yes Stairs assistance: Contact guard assist Stair Management: Two rails, Step to pattern, Forwards Number of Stairs: 4 General stair comments: Min multi-modal cues for proper sequencing with pt demonstrating good carryover and stability throughout   Wheelchair Mobility     Tilt Bed    Modified Rankin (Stroke Patients Only)       Balance           Standing balance support: Bilateral upper extremity supported, During functional activity, Reliant on assistive device for balance Standing balance-Leahy Scale: Good                              Communication Communication Communication: No apparent difficulties  Cognition Arousal: Alert Behavior During Therapy: WFL for tasks assessed/performed   PT - Cognitive impairments: No apparent impairments  Following commands: Intact      Cueing Cueing Techniques: Verbal cues, Gestural cues, Visual cues  Exercises Total Joint Exercises Quad Sets: AROM, Strengthening, Left, 10 reps Long Arc Quad: AROM, Strengthening, Left, 5 reps, 10 reps Knee Flexion: AROM, Strengthening, Left, 5 reps, 10 reps Goniometric ROM: L knee AROM grossly 3-100 deg Other Exercises Other Exercises: HEP education/review per handout  with emphasis on LLE QS and seated knee flex Other Exercises: Positioning education/review to promote L knee ext PROM and prevent heel pressure Other Exercises: Car transfer sequencing education provided    General Comments        Pertinent Vitals/Pain Pain Assessment Pain Assessment: 0-10 Pain Score: 3  Pain Location: L knee Pain Descriptors / Indicators: Sore Pain Intervention(s): Monitored during session, Premedicated before session, Repositioned, Ice applied    Home Living Family/patient expects to be discharged to:: Private residence Living Arrangements: Spouse/significant other Available Help at Discharge: Family;Available 24 hours/day (husband, step daugther, grand daughter) Type of Home: Apartment Home Access: Level entry       Home Layout: One level Home Equipment: Agricultural consultant (2 wheels);Rollator (4 wheels);BSC/3in1;Wheelchair - power;Cane - single point Additional Comments: reacher/sock aid; pt report family can assist as needed    Prior Function            PT Goals (current goals can now be found in the care plan section) Progress towards PT goals: Progressing toward goals    Frequency    BID      PT Plan      Co-evaluation              AM-PAC PT "6 Clicks" Mobility   Outcome Measure  Help needed turning from your back to your side while in a flat bed without using bedrails?: None Help needed moving from lying on your back to sitting on the side of a flat bed without using bedrails?: None Help needed moving to and from a bed to a chair (including a wheelchair)?: A Little Help needed standing up from a chair using your arms (e.g., wheelchair or bedside chair)?: A Little Help needed to walk in hospital room?: A Little Help needed climbing 3-5 steps with a railing? : A Little 6 Click Score: 20    End of Session Equipment Utilized During Treatment: Gait belt Activity Tolerance: Patient tolerated treatment well Patient left: in chair;with  call bell/phone within reach;with SCD's reapplied;Other (comment) (polar care donned to L knee) Nurse Communication: Mobility status;Weight bearing status PT Visit Diagnosis: Other abnormalities of gait and mobility (R26.89);Muscle weakness (generalized) (M62.81);History of falling (Z91.81)     Time: 1010-1044 PT Time Calculation (min) (ACUTE ONLY): 34 min  Charges:    $Gait Training: 23-37 mins PT General Charges $$ ACUTE PT VISIT: 1 Visit                     D. Scott Ludie Hudon PT, DPT 09/12/23, 11:07 AM

## 2023-09-13 ENCOUNTER — Encounter: Payer: Self-pay | Admitting: Orthopedic Surgery

## 2023-09-13 DIAGNOSIS — I2510 Atherosclerotic heart disease of native coronary artery without angina pectoris: Secondary | ICD-10-CM | POA: Diagnosis not present

## 2023-09-13 DIAGNOSIS — N182 Chronic kidney disease, stage 2 (mild): Secondary | ICD-10-CM | POA: Diagnosis not present

## 2023-09-13 DIAGNOSIS — Z471 Aftercare following joint replacement surgery: Secondary | ICD-10-CM | POA: Diagnosis not present

## 2023-09-13 DIAGNOSIS — G629 Polyneuropathy, unspecified: Secondary | ICD-10-CM | POA: Diagnosis not present

## 2023-09-13 DIAGNOSIS — I480 Paroxysmal atrial fibrillation: Secondary | ICD-10-CM | POA: Diagnosis not present

## 2023-09-13 DIAGNOSIS — I5042 Chronic combined systolic (congestive) and diastolic (congestive) heart failure: Secondary | ICD-10-CM | POA: Diagnosis not present

## 2023-09-13 DIAGNOSIS — M159 Polyosteoarthritis, unspecified: Secondary | ICD-10-CM | POA: Diagnosis not present

## 2023-09-13 DIAGNOSIS — Z96652 Presence of left artificial knee joint: Secondary | ICD-10-CM | POA: Diagnosis not present

## 2023-09-13 DIAGNOSIS — M51369 Other intervertebral disc degeneration, lumbar region without mention of lumbar back pain or lower extremity pain: Secondary | ICD-10-CM | POA: Diagnosis not present

## 2023-09-13 DIAGNOSIS — Z791 Long term (current) use of non-steroidal anti-inflammatories (NSAID): Secondary | ICD-10-CM | POA: Diagnosis not present

## 2023-09-13 DIAGNOSIS — I4892 Unspecified atrial flutter: Secondary | ICD-10-CM | POA: Diagnosis not present

## 2023-09-13 DIAGNOSIS — K279 Peptic ulcer, site unspecified, unspecified as acute or chronic, without hemorrhage or perforation: Secondary | ICD-10-CM | POA: Diagnosis not present

## 2023-09-13 DIAGNOSIS — G2581 Restless legs syndrome: Secondary | ICD-10-CM | POA: Diagnosis not present

## 2023-09-13 DIAGNOSIS — G4733 Obstructive sleep apnea (adult) (pediatric): Secondary | ICD-10-CM | POA: Diagnosis not present

## 2023-09-13 DIAGNOSIS — I252 Old myocardial infarction: Secondary | ICD-10-CM | POA: Diagnosis not present

## 2023-09-13 DIAGNOSIS — I130 Hypertensive heart and chronic kidney disease with heart failure and stage 1 through stage 4 chronic kidney disease, or unspecified chronic kidney disease: Secondary | ICD-10-CM | POA: Diagnosis not present

## 2023-09-13 DIAGNOSIS — D631 Anemia in chronic kidney disease: Secondary | ICD-10-CM | POA: Diagnosis not present

## 2023-09-13 DIAGNOSIS — J449 Chronic obstructive pulmonary disease, unspecified: Secondary | ICD-10-CM | POA: Diagnosis not present

## 2023-09-13 DIAGNOSIS — D709 Neutropenia, unspecified: Secondary | ICD-10-CM | POA: Diagnosis not present

## 2023-09-13 DIAGNOSIS — I700 Atherosclerosis of aorta: Secondary | ICD-10-CM | POA: Diagnosis not present

## 2023-09-13 DIAGNOSIS — E039 Hypothyroidism, unspecified: Secondary | ICD-10-CM | POA: Diagnosis not present

## 2023-09-15 DIAGNOSIS — N182 Chronic kidney disease, stage 2 (mild): Secondary | ICD-10-CM | POA: Diagnosis not present

## 2023-09-15 DIAGNOSIS — G4733 Obstructive sleep apnea (adult) (pediatric): Secondary | ICD-10-CM | POA: Diagnosis not present

## 2023-09-15 DIAGNOSIS — I700 Atherosclerosis of aorta: Secondary | ICD-10-CM | POA: Diagnosis not present

## 2023-09-15 DIAGNOSIS — I252 Old myocardial infarction: Secondary | ICD-10-CM | POA: Diagnosis not present

## 2023-09-15 DIAGNOSIS — I2510 Atherosclerotic heart disease of native coronary artery without angina pectoris: Secondary | ICD-10-CM | POA: Diagnosis not present

## 2023-09-15 DIAGNOSIS — Z791 Long term (current) use of non-steroidal anti-inflammatories (NSAID): Secondary | ICD-10-CM | POA: Diagnosis not present

## 2023-09-15 DIAGNOSIS — I480 Paroxysmal atrial fibrillation: Secondary | ICD-10-CM | POA: Diagnosis not present

## 2023-09-15 DIAGNOSIS — Z471 Aftercare following joint replacement surgery: Secondary | ICD-10-CM | POA: Diagnosis not present

## 2023-09-15 DIAGNOSIS — E039 Hypothyroidism, unspecified: Secondary | ICD-10-CM | POA: Diagnosis not present

## 2023-09-15 DIAGNOSIS — G2581 Restless legs syndrome: Secondary | ICD-10-CM | POA: Diagnosis not present

## 2023-09-15 DIAGNOSIS — G629 Polyneuropathy, unspecified: Secondary | ICD-10-CM | POA: Diagnosis not present

## 2023-09-15 DIAGNOSIS — M51369 Other intervertebral disc degeneration, lumbar region without mention of lumbar back pain or lower extremity pain: Secondary | ICD-10-CM | POA: Diagnosis not present

## 2023-09-15 DIAGNOSIS — D709 Neutropenia, unspecified: Secondary | ICD-10-CM | POA: Diagnosis not present

## 2023-09-15 DIAGNOSIS — Z96652 Presence of left artificial knee joint: Secondary | ICD-10-CM | POA: Diagnosis not present

## 2023-09-15 DIAGNOSIS — J449 Chronic obstructive pulmonary disease, unspecified: Secondary | ICD-10-CM | POA: Diagnosis not present

## 2023-09-15 DIAGNOSIS — I5042 Chronic combined systolic (congestive) and diastolic (congestive) heart failure: Secondary | ICD-10-CM | POA: Diagnosis not present

## 2023-09-15 DIAGNOSIS — I130 Hypertensive heart and chronic kidney disease with heart failure and stage 1 through stage 4 chronic kidney disease, or unspecified chronic kidney disease: Secondary | ICD-10-CM | POA: Diagnosis not present

## 2023-09-15 DIAGNOSIS — K279 Peptic ulcer, site unspecified, unspecified as acute or chronic, without hemorrhage or perforation: Secondary | ICD-10-CM | POA: Diagnosis not present

## 2023-09-15 DIAGNOSIS — D631 Anemia in chronic kidney disease: Secondary | ICD-10-CM | POA: Diagnosis not present

## 2023-09-15 DIAGNOSIS — I4892 Unspecified atrial flutter: Secondary | ICD-10-CM | POA: Diagnosis not present

## 2023-09-15 DIAGNOSIS — M159 Polyosteoarthritis, unspecified: Secondary | ICD-10-CM | POA: Diagnosis not present

## 2023-09-18 DIAGNOSIS — G629 Polyneuropathy, unspecified: Secondary | ICD-10-CM | POA: Diagnosis not present

## 2023-09-18 DIAGNOSIS — I480 Paroxysmal atrial fibrillation: Secondary | ICD-10-CM | POA: Diagnosis not present

## 2023-09-18 DIAGNOSIS — I130 Hypertensive heart and chronic kidney disease with heart failure and stage 1 through stage 4 chronic kidney disease, or unspecified chronic kidney disease: Secondary | ICD-10-CM | POA: Diagnosis not present

## 2023-09-18 DIAGNOSIS — D709 Neutropenia, unspecified: Secondary | ICD-10-CM | POA: Diagnosis not present

## 2023-09-18 DIAGNOSIS — I5042 Chronic combined systolic (congestive) and diastolic (congestive) heart failure: Secondary | ICD-10-CM | POA: Diagnosis not present

## 2023-09-18 DIAGNOSIS — N182 Chronic kidney disease, stage 2 (mild): Secondary | ICD-10-CM | POA: Diagnosis not present

## 2023-09-18 DIAGNOSIS — Z96652 Presence of left artificial knee joint: Secondary | ICD-10-CM | POA: Diagnosis not present

## 2023-09-18 DIAGNOSIS — J449 Chronic obstructive pulmonary disease, unspecified: Secondary | ICD-10-CM | POA: Diagnosis not present

## 2023-09-18 DIAGNOSIS — K279 Peptic ulcer, site unspecified, unspecified as acute or chronic, without hemorrhage or perforation: Secondary | ICD-10-CM | POA: Diagnosis not present

## 2023-09-18 DIAGNOSIS — I4892 Unspecified atrial flutter: Secondary | ICD-10-CM | POA: Diagnosis not present

## 2023-09-18 DIAGNOSIS — I2510 Atherosclerotic heart disease of native coronary artery without angina pectoris: Secondary | ICD-10-CM | POA: Diagnosis not present

## 2023-09-18 DIAGNOSIS — E039 Hypothyroidism, unspecified: Secondary | ICD-10-CM | POA: Diagnosis not present

## 2023-09-18 DIAGNOSIS — Z471 Aftercare following joint replacement surgery: Secondary | ICD-10-CM | POA: Diagnosis not present

## 2023-09-18 DIAGNOSIS — I700 Atherosclerosis of aorta: Secondary | ICD-10-CM | POA: Diagnosis not present

## 2023-09-18 DIAGNOSIS — Z791 Long term (current) use of non-steroidal anti-inflammatories (NSAID): Secondary | ICD-10-CM | POA: Diagnosis not present

## 2023-09-18 DIAGNOSIS — M51369 Other intervertebral disc degeneration, lumbar region without mention of lumbar back pain or lower extremity pain: Secondary | ICD-10-CM | POA: Diagnosis not present

## 2023-09-18 DIAGNOSIS — M159 Polyosteoarthritis, unspecified: Secondary | ICD-10-CM | POA: Diagnosis not present

## 2023-09-18 DIAGNOSIS — D631 Anemia in chronic kidney disease: Secondary | ICD-10-CM | POA: Diagnosis not present

## 2023-09-18 DIAGNOSIS — I252 Old myocardial infarction: Secondary | ICD-10-CM | POA: Diagnosis not present

## 2023-09-18 DIAGNOSIS — G4733 Obstructive sleep apnea (adult) (pediatric): Secondary | ICD-10-CM | POA: Diagnosis not present

## 2023-09-18 DIAGNOSIS — G2581 Restless legs syndrome: Secondary | ICD-10-CM | POA: Diagnosis not present

## 2023-09-20 DIAGNOSIS — M51369 Other intervertebral disc degeneration, lumbar region without mention of lumbar back pain or lower extremity pain: Secondary | ICD-10-CM | POA: Diagnosis not present

## 2023-09-20 DIAGNOSIS — M159 Polyosteoarthritis, unspecified: Secondary | ICD-10-CM | POA: Diagnosis not present

## 2023-09-20 DIAGNOSIS — G629 Polyneuropathy, unspecified: Secondary | ICD-10-CM | POA: Diagnosis not present

## 2023-09-20 DIAGNOSIS — D631 Anemia in chronic kidney disease: Secondary | ICD-10-CM | POA: Diagnosis not present

## 2023-09-20 DIAGNOSIS — N182 Chronic kidney disease, stage 2 (mild): Secondary | ICD-10-CM | POA: Diagnosis not present

## 2023-09-20 DIAGNOSIS — Z96652 Presence of left artificial knee joint: Secondary | ICD-10-CM | POA: Diagnosis not present

## 2023-09-20 DIAGNOSIS — I480 Paroxysmal atrial fibrillation: Secondary | ICD-10-CM | POA: Diagnosis not present

## 2023-09-20 DIAGNOSIS — I700 Atherosclerosis of aorta: Secondary | ICD-10-CM | POA: Diagnosis not present

## 2023-09-20 DIAGNOSIS — Z791 Long term (current) use of non-steroidal anti-inflammatories (NSAID): Secondary | ICD-10-CM | POA: Diagnosis not present

## 2023-09-20 DIAGNOSIS — I2510 Atherosclerotic heart disease of native coronary artery without angina pectoris: Secondary | ICD-10-CM | POA: Diagnosis not present

## 2023-09-20 DIAGNOSIS — I130 Hypertensive heart and chronic kidney disease with heart failure and stage 1 through stage 4 chronic kidney disease, or unspecified chronic kidney disease: Secondary | ICD-10-CM | POA: Diagnosis not present

## 2023-09-20 DIAGNOSIS — G2581 Restless legs syndrome: Secondary | ICD-10-CM | POA: Diagnosis not present

## 2023-09-20 DIAGNOSIS — I5042 Chronic combined systolic (congestive) and diastolic (congestive) heart failure: Secondary | ICD-10-CM | POA: Diagnosis not present

## 2023-09-20 DIAGNOSIS — Z471 Aftercare following joint replacement surgery: Secondary | ICD-10-CM | POA: Diagnosis not present

## 2023-09-20 DIAGNOSIS — E039 Hypothyroidism, unspecified: Secondary | ICD-10-CM | POA: Diagnosis not present

## 2023-09-20 DIAGNOSIS — I4892 Unspecified atrial flutter: Secondary | ICD-10-CM | POA: Diagnosis not present

## 2023-09-20 DIAGNOSIS — J449 Chronic obstructive pulmonary disease, unspecified: Secondary | ICD-10-CM | POA: Diagnosis not present

## 2023-09-20 DIAGNOSIS — G4733 Obstructive sleep apnea (adult) (pediatric): Secondary | ICD-10-CM | POA: Diagnosis not present

## 2023-09-20 DIAGNOSIS — D709 Neutropenia, unspecified: Secondary | ICD-10-CM | POA: Diagnosis not present

## 2023-09-20 DIAGNOSIS — I252 Old myocardial infarction: Secondary | ICD-10-CM | POA: Diagnosis not present

## 2023-09-20 DIAGNOSIS — K279 Peptic ulcer, site unspecified, unspecified as acute or chronic, without hemorrhage or perforation: Secondary | ICD-10-CM | POA: Diagnosis not present

## 2023-09-22 DIAGNOSIS — I252 Old myocardial infarction: Secondary | ICD-10-CM | POA: Diagnosis not present

## 2023-09-22 DIAGNOSIS — I700 Atherosclerosis of aorta: Secondary | ICD-10-CM | POA: Diagnosis not present

## 2023-09-22 DIAGNOSIS — M51369 Other intervertebral disc degeneration, lumbar region without mention of lumbar back pain or lower extremity pain: Secondary | ICD-10-CM | POA: Diagnosis not present

## 2023-09-22 DIAGNOSIS — D631 Anemia in chronic kidney disease: Secondary | ICD-10-CM | POA: Diagnosis not present

## 2023-09-22 DIAGNOSIS — G4733 Obstructive sleep apnea (adult) (pediatric): Secondary | ICD-10-CM | POA: Diagnosis not present

## 2023-09-22 DIAGNOSIS — N182 Chronic kidney disease, stage 2 (mild): Secondary | ICD-10-CM | POA: Diagnosis not present

## 2023-09-22 DIAGNOSIS — I130 Hypertensive heart and chronic kidney disease with heart failure and stage 1 through stage 4 chronic kidney disease, or unspecified chronic kidney disease: Secondary | ICD-10-CM | POA: Diagnosis not present

## 2023-09-22 DIAGNOSIS — G2581 Restless legs syndrome: Secondary | ICD-10-CM | POA: Diagnosis not present

## 2023-09-22 DIAGNOSIS — I2510 Atherosclerotic heart disease of native coronary artery without angina pectoris: Secondary | ICD-10-CM | POA: Diagnosis not present

## 2023-09-22 DIAGNOSIS — E039 Hypothyroidism, unspecified: Secondary | ICD-10-CM | POA: Diagnosis not present

## 2023-09-22 DIAGNOSIS — D709 Neutropenia, unspecified: Secondary | ICD-10-CM | POA: Diagnosis not present

## 2023-09-22 DIAGNOSIS — Z96652 Presence of left artificial knee joint: Secondary | ICD-10-CM | POA: Diagnosis not present

## 2023-09-22 DIAGNOSIS — J449 Chronic obstructive pulmonary disease, unspecified: Secondary | ICD-10-CM | POA: Diagnosis not present

## 2023-09-22 DIAGNOSIS — I5042 Chronic combined systolic (congestive) and diastolic (congestive) heart failure: Secondary | ICD-10-CM | POA: Diagnosis not present

## 2023-09-22 DIAGNOSIS — Z791 Long term (current) use of non-steroidal anti-inflammatories (NSAID): Secondary | ICD-10-CM | POA: Diagnosis not present

## 2023-09-22 DIAGNOSIS — I480 Paroxysmal atrial fibrillation: Secondary | ICD-10-CM | POA: Diagnosis not present

## 2023-09-22 DIAGNOSIS — I4892 Unspecified atrial flutter: Secondary | ICD-10-CM | POA: Diagnosis not present

## 2023-09-22 DIAGNOSIS — Z471 Aftercare following joint replacement surgery: Secondary | ICD-10-CM | POA: Diagnosis not present

## 2023-09-22 DIAGNOSIS — M159 Polyosteoarthritis, unspecified: Secondary | ICD-10-CM | POA: Diagnosis not present

## 2023-09-22 DIAGNOSIS — K279 Peptic ulcer, site unspecified, unspecified as acute or chronic, without hemorrhage or perforation: Secondary | ICD-10-CM | POA: Diagnosis not present

## 2023-09-22 DIAGNOSIS — G629 Polyneuropathy, unspecified: Secondary | ICD-10-CM | POA: Diagnosis not present

## 2023-09-25 DIAGNOSIS — D709 Neutropenia, unspecified: Secondary | ICD-10-CM | POA: Diagnosis not present

## 2023-09-25 DIAGNOSIS — E039 Hypothyroidism, unspecified: Secondary | ICD-10-CM | POA: Diagnosis not present

## 2023-09-25 DIAGNOSIS — G629 Polyneuropathy, unspecified: Secondary | ICD-10-CM | POA: Diagnosis not present

## 2023-09-25 DIAGNOSIS — I700 Atherosclerosis of aorta: Secondary | ICD-10-CM | POA: Diagnosis not present

## 2023-09-25 DIAGNOSIS — Z96652 Presence of left artificial knee joint: Secondary | ICD-10-CM | POA: Diagnosis not present

## 2023-09-25 DIAGNOSIS — Z471 Aftercare following joint replacement surgery: Secondary | ICD-10-CM | POA: Diagnosis not present

## 2023-09-25 DIAGNOSIS — D631 Anemia in chronic kidney disease: Secondary | ICD-10-CM | POA: Diagnosis not present

## 2023-09-25 DIAGNOSIS — K279 Peptic ulcer, site unspecified, unspecified as acute or chronic, without hemorrhage or perforation: Secondary | ICD-10-CM | POA: Diagnosis not present

## 2023-09-25 DIAGNOSIS — M51369 Other intervertebral disc degeneration, lumbar region without mention of lumbar back pain or lower extremity pain: Secondary | ICD-10-CM | POA: Diagnosis not present

## 2023-09-25 DIAGNOSIS — I2510 Atherosclerotic heart disease of native coronary artery without angina pectoris: Secondary | ICD-10-CM | POA: Diagnosis not present

## 2023-09-25 DIAGNOSIS — N182 Chronic kidney disease, stage 2 (mild): Secondary | ICD-10-CM | POA: Diagnosis not present

## 2023-09-25 DIAGNOSIS — I5042 Chronic combined systolic (congestive) and diastolic (congestive) heart failure: Secondary | ICD-10-CM | POA: Diagnosis not present

## 2023-09-25 DIAGNOSIS — G4733 Obstructive sleep apnea (adult) (pediatric): Secondary | ICD-10-CM | POA: Diagnosis not present

## 2023-09-25 DIAGNOSIS — Z791 Long term (current) use of non-steroidal anti-inflammatories (NSAID): Secondary | ICD-10-CM | POA: Diagnosis not present

## 2023-09-25 DIAGNOSIS — G2581 Restless legs syndrome: Secondary | ICD-10-CM | POA: Diagnosis not present

## 2023-09-25 DIAGNOSIS — I480 Paroxysmal atrial fibrillation: Secondary | ICD-10-CM | POA: Diagnosis not present

## 2023-09-25 DIAGNOSIS — J449 Chronic obstructive pulmonary disease, unspecified: Secondary | ICD-10-CM | POA: Diagnosis not present

## 2023-09-25 DIAGNOSIS — I130 Hypertensive heart and chronic kidney disease with heart failure and stage 1 through stage 4 chronic kidney disease, or unspecified chronic kidney disease: Secondary | ICD-10-CM | POA: Diagnosis not present

## 2023-09-25 DIAGNOSIS — I4892 Unspecified atrial flutter: Secondary | ICD-10-CM | POA: Diagnosis not present

## 2023-09-25 DIAGNOSIS — I252 Old myocardial infarction: Secondary | ICD-10-CM | POA: Diagnosis not present

## 2023-09-25 DIAGNOSIS — M159 Polyosteoarthritis, unspecified: Secondary | ICD-10-CM | POA: Diagnosis not present

## 2023-09-26 DIAGNOSIS — M25562 Pain in left knee: Secondary | ICD-10-CM | POA: Diagnosis not present

## 2023-09-26 DIAGNOSIS — Z96652 Presence of left artificial knee joint: Secondary | ICD-10-CM | POA: Diagnosis not present

## 2023-09-28 DIAGNOSIS — Z96652 Presence of left artificial knee joint: Secondary | ICD-10-CM | POA: Diagnosis not present

## 2023-10-02 DIAGNOSIS — Z96652 Presence of left artificial knee joint: Secondary | ICD-10-CM | POA: Diagnosis not present

## 2023-10-04 DIAGNOSIS — Z96652 Presence of left artificial knee joint: Secondary | ICD-10-CM | POA: Diagnosis not present

## 2023-10-04 DIAGNOSIS — M25562 Pain in left knee: Secondary | ICD-10-CM | POA: Diagnosis not present

## 2023-10-06 DIAGNOSIS — M25562 Pain in left knee: Secondary | ICD-10-CM | POA: Diagnosis not present

## 2023-10-06 DIAGNOSIS — Z96652 Presence of left artificial knee joint: Secondary | ICD-10-CM | POA: Diagnosis not present

## 2023-10-09 DIAGNOSIS — M25562 Pain in left knee: Secondary | ICD-10-CM | POA: Diagnosis not present

## 2023-10-09 DIAGNOSIS — Z96652 Presence of left artificial knee joint: Secondary | ICD-10-CM | POA: Diagnosis not present

## 2023-10-12 ENCOUNTER — Ambulatory Visit: Payer: Medicare Other | Attending: Radiation Oncology | Admitting: Radiation Oncology

## 2023-10-13 DIAGNOSIS — Z96652 Presence of left artificial knee joint: Secondary | ICD-10-CM | POA: Diagnosis not present

## 2023-10-15 ENCOUNTER — Other Ambulatory Visit: Payer: Self-pay

## 2023-10-15 ENCOUNTER — Emergency Department

## 2023-10-15 DIAGNOSIS — E785 Hyperlipidemia, unspecified: Secondary | ICD-10-CM | POA: Diagnosis present

## 2023-10-15 DIAGNOSIS — I130 Hypertensive heart and chronic kidney disease with heart failure and stage 1 through stage 4 chronic kidney disease, or unspecified chronic kidney disease: Secondary | ICD-10-CM | POA: Diagnosis present

## 2023-10-15 DIAGNOSIS — I700 Atherosclerosis of aorta: Secondary | ICD-10-CM | POA: Diagnosis not present

## 2023-10-15 DIAGNOSIS — Z96643 Presence of artificial hip joint, bilateral: Secondary | ICD-10-CM | POA: Diagnosis not present

## 2023-10-15 DIAGNOSIS — Z803 Family history of malignant neoplasm of breast: Secondary | ICD-10-CM

## 2023-10-15 DIAGNOSIS — I10 Essential (primary) hypertension: Secondary | ICD-10-CM | POA: Diagnosis not present

## 2023-10-15 DIAGNOSIS — J449 Chronic obstructive pulmonary disease, unspecified: Secondary | ICD-10-CM | POA: Diagnosis not present

## 2023-10-15 DIAGNOSIS — Z981 Arthrodesis status: Secondary | ICD-10-CM

## 2023-10-15 DIAGNOSIS — I480 Paroxysmal atrial fibrillation: Secondary | ICD-10-CM | POA: Diagnosis present

## 2023-10-15 DIAGNOSIS — Z8249 Family history of ischemic heart disease and other diseases of the circulatory system: Secondary | ICD-10-CM | POA: Diagnosis not present

## 2023-10-15 DIAGNOSIS — R262 Difficulty in walking, not elsewhere classified: Secondary | ICD-10-CM | POA: Diagnosis not present

## 2023-10-15 DIAGNOSIS — Z818 Family history of other mental and behavioral disorders: Secondary | ICD-10-CM

## 2023-10-15 DIAGNOSIS — M5416 Radiculopathy, lumbar region: Secondary | ICD-10-CM | POA: Diagnosis not present

## 2023-10-15 DIAGNOSIS — E66812 Obesity, class 2: Secondary | ICD-10-CM | POA: Diagnosis present

## 2023-10-15 DIAGNOSIS — Z8349 Family history of other endocrine, nutritional and metabolic diseases: Secondary | ICD-10-CM

## 2023-10-15 DIAGNOSIS — Z7901 Long term (current) use of anticoagulants: Secondary | ICD-10-CM

## 2023-10-15 DIAGNOSIS — Z800 Family history of malignant neoplasm of digestive organs: Secondary | ICD-10-CM

## 2023-10-15 DIAGNOSIS — I5042 Chronic combined systolic (congestive) and diastolic (congestive) heart failure: Secondary | ICD-10-CM | POA: Diagnosis not present

## 2023-10-15 DIAGNOSIS — G2581 Restless legs syndrome: Secondary | ICD-10-CM | POA: Diagnosis not present

## 2023-10-15 DIAGNOSIS — D62 Acute posthemorrhagic anemia: Secondary | ICD-10-CM | POA: Diagnosis not present

## 2023-10-15 DIAGNOSIS — I4892 Unspecified atrial flutter: Secondary | ICD-10-CM | POA: Diagnosis not present

## 2023-10-15 DIAGNOSIS — M48061 Spinal stenosis, lumbar region without neurogenic claudication: Secondary | ICD-10-CM | POA: Diagnosis not present

## 2023-10-15 DIAGNOSIS — Z87891 Personal history of nicotine dependence: Secondary | ICD-10-CM

## 2023-10-15 DIAGNOSIS — Z885 Allergy status to narcotic agent status: Secondary | ICD-10-CM

## 2023-10-15 DIAGNOSIS — Z79899 Other long term (current) drug therapy: Secondary | ICD-10-CM

## 2023-10-15 DIAGNOSIS — M1612 Unilateral primary osteoarthritis, left hip: Secondary | ICD-10-CM | POA: Diagnosis not present

## 2023-10-15 DIAGNOSIS — Z9221 Personal history of antineoplastic chemotherapy: Secondary | ICD-10-CM | POA: Diagnosis not present

## 2023-10-15 DIAGNOSIS — M4186 Other forms of scoliosis, lumbar region: Secondary | ICD-10-CM | POA: Diagnosis not present

## 2023-10-15 DIAGNOSIS — I2510 Atherosclerotic heart disease of native coronary artery without angina pectoris: Secondary | ICD-10-CM | POA: Diagnosis present

## 2023-10-15 DIAGNOSIS — I5032 Chronic diastolic (congestive) heart failure: Secondary | ICD-10-CM | POA: Diagnosis not present

## 2023-10-15 DIAGNOSIS — T8450XA Infection and inflammatory reaction due to unspecified internal joint prosthesis, initial encounter: Secondary | ICD-10-CM | POA: Diagnosis not present

## 2023-10-15 DIAGNOSIS — F419 Anxiety disorder, unspecified: Secondary | ICD-10-CM | POA: Diagnosis present

## 2023-10-15 DIAGNOSIS — B957 Other staphylococcus as the cause of diseases classified elsewhere: Secondary | ICD-10-CM | POA: Diagnosis not present

## 2023-10-15 DIAGNOSIS — M8580 Other specified disorders of bone density and structure, unspecified site: Secondary | ICD-10-CM | POA: Diagnosis present

## 2023-10-15 DIAGNOSIS — B958 Unspecified staphylococcus as the cause of diseases classified elsewhere: Secondary | ICD-10-CM | POA: Diagnosis not present

## 2023-10-15 DIAGNOSIS — Z806 Family history of leukemia: Secondary | ICD-10-CM

## 2023-10-15 DIAGNOSIS — M5126 Other intervertebral disc displacement, lumbar region: Secondary | ICD-10-CM | POA: Diagnosis not present

## 2023-10-15 DIAGNOSIS — Z96641 Presence of right artificial hip joint: Secondary | ICD-10-CM | POA: Diagnosis present

## 2023-10-15 DIAGNOSIS — F32A Depression, unspecified: Secondary | ICD-10-CM | POA: Diagnosis present

## 2023-10-15 DIAGNOSIS — Z6839 Body mass index (BMI) 39.0-39.9, adult: Secondary | ICD-10-CM

## 2023-10-15 DIAGNOSIS — G4733 Obstructive sleep apnea (adult) (pediatric): Secondary | ICD-10-CM | POA: Diagnosis present

## 2023-10-15 DIAGNOSIS — Z853 Personal history of malignant neoplasm of breast: Secondary | ICD-10-CM

## 2023-10-15 DIAGNOSIS — E890 Postprocedural hypothyroidism: Secondary | ICD-10-CM | POA: Diagnosis not present

## 2023-10-15 DIAGNOSIS — T8452XA Infection and inflammatory reaction due to internal left hip prosthesis, initial encounter: Secondary | ICD-10-CM | POA: Diagnosis not present

## 2023-10-15 DIAGNOSIS — G8929 Other chronic pain: Secondary | ICD-10-CM | POA: Diagnosis present

## 2023-10-15 DIAGNOSIS — Z888 Allergy status to other drugs, medicaments and biological substances status: Secondary | ICD-10-CM

## 2023-10-15 DIAGNOSIS — T8149XA Infection following a procedure, other surgical site, initial encounter: Secondary | ICD-10-CM | POA: Diagnosis not present

## 2023-10-15 DIAGNOSIS — Z96642 Presence of left artificial hip joint: Secondary | ICD-10-CM | POA: Diagnosis not present

## 2023-10-15 DIAGNOSIS — Z8041 Family history of malignant neoplasm of ovary: Secondary | ICD-10-CM

## 2023-10-15 DIAGNOSIS — Y831 Surgical operation with implant of artificial internal device as the cause of abnormal reaction of the patient, or of later complication, without mention of misadventure at the time of the procedure: Secondary | ICD-10-CM | POA: Diagnosis present

## 2023-10-15 DIAGNOSIS — I4891 Unspecified atrial fibrillation: Secondary | ICD-10-CM | POA: Diagnosis not present

## 2023-10-15 DIAGNOSIS — Z471 Aftercare following joint replacement surgery: Secondary | ICD-10-CM | POA: Diagnosis not present

## 2023-10-15 DIAGNOSIS — N182 Chronic kidney disease, stage 2 (mild): Secondary | ICD-10-CM | POA: Diagnosis not present

## 2023-10-15 DIAGNOSIS — R590 Localized enlarged lymph nodes: Secondary | ICD-10-CM | POA: Diagnosis not present

## 2023-10-15 DIAGNOSIS — M4316 Spondylolisthesis, lumbar region: Secondary | ICD-10-CM | POA: Diagnosis not present

## 2023-10-15 DIAGNOSIS — I77810 Thoracic aortic ectasia: Secondary | ICD-10-CM | POA: Diagnosis present

## 2023-10-15 DIAGNOSIS — Z91040 Latex allergy status: Secondary | ICD-10-CM

## 2023-10-15 DIAGNOSIS — Z96653 Presence of artificial knee joint, bilateral: Secondary | ICD-10-CM | POA: Diagnosis present

## 2023-10-15 DIAGNOSIS — M25552 Pain in left hip: Secondary | ICD-10-CM | POA: Diagnosis not present

## 2023-10-15 NOTE — ED Triage Notes (Signed)
 Pt to ed from home via [facility] for left sided HIP pain. Pt has HX of hip replacement in same hip. Pt has PT 3x a week. Pt feels like today it is more painful than normal and affecting her walking more than normal. Pt denies felling and denies any injuries. Pt took her prescribed tramadol 2 hours ago with no relief. Pt is caox4, in no acute distress.

## 2023-10-15 NOTE — ED Notes (Signed)
 Pt arrived from home BIB EMS for left hip w/o injury. Pain has gotten worse throughout the day, sharp pain that is constant, but is non-radiating. Pt has had prior hip sx to the left hip. Recent left knee sx 5 weeks ago, has Pt 3x a week.  EMS vitals 123/66 68 HR 98% RA

## 2023-10-16 ENCOUNTER — Emergency Department

## 2023-10-16 ENCOUNTER — Other Ambulatory Visit: Payer: Self-pay | Admitting: Interventional Radiology

## 2023-10-16 ENCOUNTER — Inpatient Hospital Stay
Admission: EM | Admit: 2023-10-16 | Discharge: 2023-10-22 | DRG: 464 | Disposition: A | Discharge status: Home Health | Attending: Internal Medicine | Admitting: Internal Medicine

## 2023-10-16 DIAGNOSIS — R350 Frequency of micturition: Secondary | ICD-10-CM

## 2023-10-16 DIAGNOSIS — R52 Pain, unspecified: Secondary | ICD-10-CM

## 2023-10-16 DIAGNOSIS — E66812 Obesity, class 2: Secondary | ICD-10-CM | POA: Diagnosis present

## 2023-10-16 DIAGNOSIS — R300 Dysuria: Secondary | ICD-10-CM

## 2023-10-16 DIAGNOSIS — E785 Hyperlipidemia, unspecified: Secondary | ICD-10-CM | POA: Diagnosis present

## 2023-10-16 DIAGNOSIS — M5126 Other intervertebral disc displacement, lumbar region: Secondary | ICD-10-CM | POA: Diagnosis not present

## 2023-10-16 DIAGNOSIS — I5032 Chronic diastolic (congestive) heart failure: Secondary | ICD-10-CM | POA: Diagnosis present

## 2023-10-16 DIAGNOSIS — I10 Essential (primary) hypertension: Secondary | ICD-10-CM | POA: Diagnosis present

## 2023-10-16 DIAGNOSIS — G2581 Restless legs syndrome: Secondary | ICD-10-CM | POA: Diagnosis present

## 2023-10-16 DIAGNOSIS — R262 Difficulty in walking, not elsewhere classified: Secondary | ICD-10-CM

## 2023-10-16 DIAGNOSIS — M5416 Radiculopathy, lumbar region: Secondary | ICD-10-CM

## 2023-10-16 DIAGNOSIS — Z471 Aftercare following joint replacement surgery: Secondary | ICD-10-CM | POA: Diagnosis not present

## 2023-10-16 DIAGNOSIS — T8450XA Infection and inflammatory reaction due to unspecified internal joint prosthesis, initial encounter: Principal | ICD-10-CM

## 2023-10-16 DIAGNOSIS — B958 Unspecified staphylococcus as the cause of diseases classified elsewhere: Secondary | ICD-10-CM

## 2023-10-16 DIAGNOSIS — M4186 Other forms of scoliosis, lumbar region: Secondary | ICD-10-CM | POA: Diagnosis not present

## 2023-10-16 DIAGNOSIS — Z96643 Presence of artificial hip joint, bilateral: Secondary | ICD-10-CM | POA: Diagnosis not present

## 2023-10-16 DIAGNOSIS — M25552 Pain in left hip: Principal | ICD-10-CM | POA: Diagnosis present

## 2023-10-16 DIAGNOSIS — I4891 Unspecified atrial fibrillation: Secondary | ICD-10-CM | POA: Diagnosis present

## 2023-10-16 DIAGNOSIS — M48061 Spinal stenosis, lumbar region without neurogenic claudication: Secondary | ICD-10-CM | POA: Diagnosis not present

## 2023-10-16 DIAGNOSIS — D62 Acute posthemorrhagic anemia: Secondary | ICD-10-CM

## 2023-10-16 DIAGNOSIS — R590 Localized enlarged lymph nodes: Secondary | ICD-10-CM | POA: Diagnosis not present

## 2023-10-16 DIAGNOSIS — F32A Depression, unspecified: Secondary | ICD-10-CM | POA: Diagnosis present

## 2023-10-16 DIAGNOSIS — M4316 Spondylolisthesis, lumbar region: Secondary | ICD-10-CM | POA: Diagnosis not present

## 2023-10-16 LAB — CBC WITH DIFFERENTIAL/PLATELET
Abs Immature Granulocytes: 0.02 10*3/uL (ref 0.00–0.07)
Basophils Absolute: 0 10*3/uL (ref 0.0–0.1)
Basophils Relative: 0 %
Eosinophils Absolute: 0 10*3/uL (ref 0.0–0.5)
Eosinophils Relative: 1 %
HCT: 31.1 % — ABNORMAL LOW (ref 36.0–46.0)
Hemoglobin: 10.1 g/dL — ABNORMAL LOW (ref 12.0–15.0)
Immature Granulocytes: 0 %
Lymphocytes Relative: 16 %
Lymphs Abs: 0.9 10*3/uL (ref 0.7–4.0)
MCH: 30.7 pg (ref 26.0–34.0)
MCHC: 32.5 g/dL (ref 30.0–36.0)
MCV: 94.5 fL (ref 80.0–100.0)
Monocytes Absolute: 0.5 10*3/uL (ref 0.1–1.0)
Monocytes Relative: 9 %
Neutro Abs: 4 10*3/uL (ref 1.7–7.7)
Neutrophils Relative %: 74 %
Platelets: 172 10*3/uL (ref 150–400)
RBC: 3.29 MIL/uL — ABNORMAL LOW (ref 3.87–5.11)
RDW: 12.8 % (ref 11.5–15.5)
WBC: 5.5 10*3/uL (ref 4.0–10.5)
nRBC: 0 % (ref 0.0–0.2)

## 2023-10-16 LAB — BASIC METABOLIC PANEL WITH GFR
Anion gap: 9 (ref 5–15)
BUN: 18 mg/dL (ref 8–23)
CO2: 23 mmol/L (ref 22–32)
Calcium: 8.2 mg/dL — ABNORMAL LOW (ref 8.9–10.3)
Chloride: 103 mmol/L (ref 98–111)
Creatinine, Ser: 0.86 mg/dL (ref 0.44–1.00)
GFR, Estimated: >60 mL/min (ref >60)
Glucose, Bld: 96 mg/dL (ref 70–99)
Potassium: 4 mmol/L (ref 3.5–5.1)
Sodium: 135 mmol/L (ref 135–145)

## 2023-10-16 LAB — SEDIMENTATION RATE: Sed Rate: 80 mm/h — ABNORMAL HIGH (ref 0–30)

## 2023-10-16 LAB — C-REACTIVE PROTEIN: CRP: 17 mg/dL — ABNORMAL HIGH (ref <1.0)

## 2023-10-16 LAB — CK: Total CK: 31 U/L — ABNORMAL LOW (ref 38–234)

## 2023-10-16 MED ORDER — HYDROMORPHONE HCL 1 MG/ML IJ SOLN
0.5000 mg | INTRAMUSCULAR | Status: DC | PRN
  Administered 2023-10-16 – 2023-10-21 (×13): 0.5 mg via INTRAVENOUS
  Filled 2023-10-16 (×3): qty 1
  Filled 2023-10-16: qty 0.5
  Filled 2023-10-16: qty 1
  Filled 2023-10-16: qty 0.5
  Filled 2023-10-16 (×7): qty 1

## 2023-10-16 MED ORDER — DEXAMETHASONE SODIUM PHOSPHATE 10 MG/ML IJ SOLN
10.0000 mg | Freq: Once | INTRAMUSCULAR | Status: AC
  Administered 2023-10-16: 10 mg via INTRAVENOUS
  Filled 2023-10-16: qty 1

## 2023-10-16 MED ORDER — APIXABAN 5 MG PO TABS
5.0000 mg | ORAL_TABLET | Freq: Two times a day (BID) | ORAL | Status: DC
  Administered 2023-10-16 – 2023-10-22 (×11): 5 mg via ORAL
  Filled 2023-10-16 (×11): qty 1

## 2023-10-16 MED ORDER — ROPINIROLE HCL 1 MG PO TABS
2.0000 mg | ORAL_TABLET | Freq: Every day | ORAL | Status: DC
  Administered 2023-10-16 – 2023-10-21 (×6): 2 mg via ORAL
  Filled 2023-10-16 (×6): qty 2

## 2023-10-16 MED ORDER — ESCITALOPRAM OXALATE 10 MG PO TABS
10.0000 mg | ORAL_TABLET | Freq: Every day | ORAL | Status: DC
  Administered 2023-10-16 – 2023-10-22 (×6): 10 mg via ORAL
  Filled 2023-10-16 (×6): qty 1

## 2023-10-16 MED ORDER — SODIUM CHLORIDE 0.9 % IV BOLUS (SEPSIS)
500.0000 mL | Freq: Once | INTRAVENOUS | Status: AC
  Administered 2023-10-16: 500 mL via INTRAVENOUS

## 2023-10-16 MED ORDER — KETOROLAC TROMETHAMINE 30 MG/ML IJ SOLN
15.0000 mg | Freq: Once | INTRAMUSCULAR | Status: AC
  Administered 2023-10-16: 15 mg via INTRAVENOUS
  Filled 2023-10-16: qty 1

## 2023-10-16 MED ORDER — ONDANSETRON HCL 4 MG PO TABS: 4.0000 mg | ORAL_TABLET | Freq: Four times a day (QID) | ORAL | Status: DC | PRN

## 2023-10-16 MED ORDER — ALPRAZOLAM 0.25 MG PO TABS
0.2500 mg | ORAL_TABLET | Freq: Two times a day (BID) | ORAL | Status: DC | PRN
  Administered 2023-10-17 – 2023-10-21 (×3): 0.25 mg via ORAL
  Filled 2023-10-16 (×3): qty 1

## 2023-10-16 MED ORDER — FENTANYL CITRATE PF 50 MCG/ML IJ SOSY
50.0000 ug | PREFILLED_SYRINGE | Freq: Once | INTRAMUSCULAR | Status: AC
  Administered 2023-10-16: 50 ug via INTRAVENOUS
  Filled 2023-10-16: qty 1

## 2023-10-16 MED ORDER — MORPHINE SULFATE (PF) 4 MG/ML IV SOLN
4.0000 mg | Freq: Once | INTRAVENOUS | Status: AC
  Administered 2023-10-16: 4 mg via INTRAVENOUS
  Filled 2023-10-16: qty 1

## 2023-10-16 MED ORDER — RANOLAZINE ER 500 MG PO TB12
500.0000 mg | ORAL_TABLET | Freq: Two times a day (BID) | ORAL | Status: DC
  Administered 2023-10-16 – 2023-10-22 (×12): 500 mg via ORAL
  Filled 2023-10-16 (×13): qty 1

## 2023-10-16 MED ORDER — ACETAMINOPHEN 500 MG PO TABS
1000.0000 mg | ORAL_TABLET | Freq: Three times a day (TID) | ORAL | Status: DC
Start: 2023-10-16 — End: 2023-10-22
  Administered 2023-10-16 – 2023-10-22 (×16): 1000 mg via ORAL
  Filled 2023-10-16 (×17): qty 2

## 2023-10-16 MED ORDER — OXYCODONE HCL 5 MG PO TABS
2.5000 mg | ORAL_TABLET | Freq: Three times a day (TID) | ORAL | Status: DC | PRN
Start: 2023-10-16 — End: 2023-10-18
  Administered 2023-10-18: 5 mg via ORAL
  Filled 2023-10-16: qty 1

## 2023-10-16 MED ORDER — ONDANSETRON HCL 4 MG/2ML IJ SOLN
4.0000 mg | Freq: Once | INTRAMUSCULAR | Status: AC
  Administered 2023-10-16: 4 mg via INTRAVENOUS
  Filled 2023-10-16: qty 2

## 2023-10-16 MED ORDER — ROSUVASTATIN CALCIUM 20 MG PO TABS
40.0000 mg | ORAL_TABLET | Freq: Every day | ORAL | Status: DC
  Administered 2023-10-16 – 2023-10-20 (×4): 40 mg via ORAL
  Filled 2023-10-16 (×4): qty 2

## 2023-10-16 MED ORDER — DOCUSATE SODIUM 100 MG PO CAPS
100.0000 mg | ORAL_CAPSULE | Freq: Two times a day (BID) | ORAL | Status: DC
  Administered 2023-10-16 (×2): 100 mg via ORAL
  Filled 2023-10-16 (×2): qty 1

## 2023-10-16 MED ORDER — FUROSEMIDE 20 MG PO TABS
20.0000 mg | ORAL_TABLET | Freq: Every day | ORAL | Status: DC
  Administered 2023-10-16 – 2023-10-22 (×6): 20 mg via ORAL
  Filled 2023-10-16 (×6): qty 1

## 2023-10-16 MED ORDER — POTASSIUM CHLORIDE CRYS ER 20 MEQ PO TBCR
20.0000 meq | EXTENDED_RELEASE_TABLET | Freq: Every day | ORAL | Status: DC
  Administered 2023-10-16 – 2023-10-22 (×6): 20 meq via ORAL
  Filled 2023-10-16 (×6): qty 1

## 2023-10-16 MED ORDER — BISOPROLOL FUMARATE 5 MG PO TABS
10.0000 mg | ORAL_TABLET | Freq: Two times a day (BID) | ORAL | Status: DC
  Administered 2023-10-16 – 2023-10-22 (×10): 10 mg via ORAL
  Filled 2023-10-16 (×13): qty 2

## 2023-10-16 NOTE — Plan of Care (Signed)

## 2023-10-16 NOTE — H&P (Signed)
 History and Physical    Amber Barnes BJY:[id number] DOB: [birth year] DOA: 10/16/2023  PCP: Allegra Grana, FNP (Confirm with patient/family/NH records and if not entered, this has to be entered at [facility] point of entry) Patient coming from: Home  I have personally briefly reviewed patient's old medical records in [facility]  Chief Complaint: Left hip pain  HPI: Amber Barnes is a 73 y.o. female with medical history significant of multiple OA status post left hip replacement and recent left knee replacement, HTN, PAF status post ablation on Eliquis, chronic HFpEF, HLD, breast cancer status post chemo/immunotherapy/radiation and surgery, hypothyroidism, presented with worsening of left hip pain.  Patient had left hip replacement back in October 2024 and has been doing well.  Lately she has had worsening of left knee pain and underwent left knee TKR and started on PT therapy 2 weeks ago and she has been told well.  Last PT was last Friday, and she reported that the intensity of PT has been increased significantly over the last week.  After starting morning, she started to feel severe pain of the left hip, could not put weight on left hip and gradually getting worse.  She appears to have no trouble moving her hips while lying in the bed and she denied any fever chills.  No back pains. ED Course: Afebrile, not tachycardia blood pressure borderline low.  Blood work showed WBC 5.5 hemoglobin 10.1, creatinine 0.8 BUN 18K 4.0.  X-ray of left hip showed intact hardware, MRI of left hip pending.  X-ray left knee showed no acute findings.  Lumbar spine MRI showed multiple level degenerative changes but most are chronic including findings on L1-L2 L2-L3 L3-4 and L4-L5.  Review of Systems: As per HPI otherwise 14 point review of systems negative.    Past Medical History:  Diagnosis Date   Achilles tendinitis    Acute medial meniscal tear    Allergy    Anemia    Anxiety    a.) on BZO PRN  (alprazolam)   Aortic atherosclerosis (HCC)    Ascending aorta dilatation (HCC) 10/20/2022   a.) TTE 10/20/2022: asc Ao 39 mm   Atrial fibrillation/flutter (HCC) 07/2016   a.) Dx'd 07/2016; b.) CHA2DS2-VASc = 5 (age, sex, HFmrEF, HTN, vasc disease history) as of 05/09/2023; c.) s/p DCCV 09/07/2016 (150 J x 2 --> A.flutter; 200 J x 1 --> NSR); d.) s/p DCCV 10/19/2016 (150 J x 1 --> NSR); e.) brought in for DCCV 12/2016; cancelled d/t NSR; f.) s/p PVI ablation 12/12/2022; g.) cardiac rate/rhythm maintained on oral bisoprolol; chronically anticoagulated using apixaban   BMI 50.0-59.9, adult (HCC) 07/16/2018   CAD (coronary artery disease)    a.) cCTA 12/08/2022: Ca2+ = 31.6 (58th %'ile for age/sex/race match control)   Cardiomegaly    Chemotherapy induced nausea and vomiting 05/18/2021   Chronic heart failure with mid-range ejection fraction (HFmrEF) (HCC)    a.) TTE 03/08/21: EF 60-65, mild LVH, G1DD, mild LAE, triv MR; b.) TTE 07/29/21: EF 60-65, G1DD, mild LAE, triv MR; c.) TTE 11/26/21: EF 55-60, G1DD, mild LAE, mild MR; d.) TTE 02/25/22: EF 60-65, G1DD, mild LAE, mild MR; e.) TTE 05/31/22: EF 55-60, mild LVH, G2DD, mild-mod MR; f.) TTE 10/20/22: EF 45-50, mild LAE, mod red RVSF, mild-mod TR, Asc Ao 39; g.) TTE 12/13/22: EF 50-55, mild LVH, mild MAC   CKD (chronic kidney disease), stage II    Colon polyp    Complication of anesthesia    a.) emergence delirium;  b.) hypoxia after hip replacement   COPD (chronic obstructive pulmonary disease) (HCC)    DDD (degenerative disc disease), lumbar    a.) s/p fusion   Depression    Endometriosis    Fibrocystic breast disease    GERD (gastroesophageal reflux disease)    History of kidney stones    History of stress test    a.) MV 07/29/2016: EF 55-65%. Small, mild defect  in mid anteroseptal, apical septal, and apical locations. No ischemia-->low risk; b.) MV 10/2022: mod sized partially rev apical ant, apical septal, and apical defect. EF 52%. No signific  cor Ca2+; c.) cCTA 12/08/2022: Ca2+ = 31.6 (58th %'ile).   History of tobacco use    Hyperlipidemia    Hypertension    Hypothyroidism    a.) s/p partial thyroidectomy for nodule; pathology benign   Insomnia    Left knee pain, unspecified chronicity    Lipoma    Malignant neoplasm of upper-outer quadrant of right female breast (HCC) 03/03/2021   a.) stage 1A invasive mammary carcinoma (cT1b or T1c N0); ER (-), PR (+), HER2/neu (+); did not tolerate adjuvant chemotherapy (paclitacel + trastuzumab) despite paclitaxel dose reduction; s/p adjuvant XRT; cinished maintenance trastuzumab 07/27/2022; no adjuvant endocrine therapy as no clear benefit   Migraine    Nephrolithiasis    On apixaban therapy    OSA (obstructive sleep apnea)    a.) non-compliant with nocturnal PAP therapy   Osteoarthritis    Osteopenia    Peptic ulcer disease    a.) H. pylori   Pericarditis 12/12/2022   a.) following A.fib ablation; resolved   Pneumonia    Port-A-Cath in place    RLS (restless legs syndrome)    a.) on ropinirole   Vitamin D deficiency     Past Surgical History:  Procedure Laterality Date   ABDOMINAL HYSTERECTOMY     ovaries left   ACHILLES TENDON SURGERY     2012,01/2017   ATRIAL FIBRILLATION ABLATION N/A 12/12/2022   Procedure: ATRIAL FIBRILLATION ABLATION;  Surgeon: Lanier Prude, MD;  Location: [facility];  Service: Cardiovascular;  Laterality: N/A;   BREAST BIOPSY Right 01/15/2021   Affirm biopsy-"X" clip-INVASIVE MAMMARY CARCINOMA   BREAST BIOPSY Right 01/15/2021   u/s bx-"venus" clip INVASIVE MAMMARY CARCINOMA   BREAST BIOPSY     BREAST BIOPSY Right 01/04/2023   u/s bx ribbon clip path pending   BREAST BIOPSY Right 01/04/2023   [place] RT BREAST BX W LOC DEV 1ST LESION IMG BX SPEC US GUIDE 01/04/2023 [facility]   BREAST LUMPECTOMY,RADIO FREQ LOCALIZER,AXILLARY SENTINEL LYMPH NODE BIOPSY Right 02/17/2021   Procedure: BREAST LUMPECTOMY,RADIO FREQ LOCALIZER,AXILLARY  SENTINEL LYMPH NODE BIOPSY;  Surgeon: Campbell Lerner, MD;  Location: [facility];  Service: General;  Laterality: Right;   CARDIOVERSION N/A 09/07/2016   Procedure: CARDIOVERSION;  Surgeon: Antonieta Iba, MD;  Location: [facility];  Service: Cardiovascular;  Laterality: N/A;   CARDIOVERSION N/A 10/19/2016   Procedure: CARDIOVERSION;  Surgeon: Antonieta Iba, MD;  Location: [facility];  Service: Cardiovascular;  Laterality: N/A;   CARDIOVERSION N/A 12/14/2016   Procedure: CARDIOVERSION;  Surgeon: Antonieta Iba, MD;  Location: [facility];  Service: Cardiovascular;  Laterality: N/A;   COLONOSCOPY     COLONOSCOPY WITH PROPOFOL N/A 05/13/2020   Procedure: COLONOSCOPY WITH PROPOFOL;  Surgeon: Toledo, Boykin Nearing, MD;  Location: [facility];  Service: Gastroenterology;  Laterality: N/A;  ELIQUIS   CYSTOSCOPY WITH STENT PLACEMENT Bilateral 07/12/2021   Procedure: CYSTOSCOPY WITH STENT PLACEMENT;  Surgeon: Sondra Come, MD;  Location: [facility];  Service: Urology;  Laterality: Bilateral;   CYSTOSCOPY/URETEROSCOPY/HOLMIUM LASER/STENT PLACEMENT Bilateral 07/30/2021   Procedure: CYSTOSCOPY/URETEROSCOPY/HOLMIUM LASER/BILATERAL STENT REMOVAL;  Surgeon: Sondra Come, MD;  Location: [facility];  Service: Urology;  Laterality: Bilateral;   ESOPHAGOGASTRODUODENOSCOPY     ESOPHAGOGASTRODUODENOSCOPY (EGD) WITH PROPOFOL N/A 05/13/2020   Procedure: ESOPHAGOGASTRODUODENOSCOPY (EGD) WITH PROPOFOL;  Surgeon: Toledo, Boykin Nearing, MD;  Location: [facility];  Service: Gastroenterology;  Laterality: N/A;   FOOT SURGERY Left    tendon   LUMBAR FUSION     PORTA CATH INSERTION N/A 03/23/2021   Procedure: PORTA CATH INSERTION;  Surgeon: Renford Dills, MD;  Location: [facility];  Service: Cardiovascular;  Laterality: N/A;   PORTACATH PLACEMENT N/A 03/23/2021   Procedure: ATTEMPTED INSERTION PORT-A-CATH;  Surgeon: Campbell Lerner, MD;  Location: [facility];  Service: General;  Laterality: N/A;    THYROIDECTOMY  1990   half thyroid lobectomy secondary to a benign nodule   TOTAL HIP ARTHROPLASTY Right 08/21/2018   Procedure: TOTAL HIP ARTHROPLASTY ANTERIOR APPROACH-RIGHT CELL SAVER REQUESTED;  Surgeon: Kennedy Bucker, MD;  Location: [facility];  Service: Orthopedics;  Laterality: Right;   TOTAL HIP ARTHROPLASTY Left 05/11/2023   Procedure: TOTAL HIP ARTHROPLASTY ANTERIOR APPROACH;  Surgeon: Reinaldo Berber, MD;  Location: [facility];  Service: Orthopedics;  Laterality: Left;   TOTAL KNEE ARTHROPLASTY Left 09/11/2023   Procedure: ARTHROPLASTY, KNEE, TOTAL;  Surgeon: Reinaldo Berber, MD;  Location: [facility];  Service: Orthopedics;  Laterality: Left;     reports that she quit smoking about 20 years ago. Her smoking use included cigarettes. She started smoking about 30 years ago. She has a 2.5 pack-year smoking history. She has been exposed to tobacco smoke. She has never used smokeless tobacco. She reports current alcohol use of about 1.0 standard drink of alcohol per week. She reports current drug use. Drug: Marijuana.  Allergies  Allergen Reactions   Hydrocodone    Prednisone Other (See Comments)    Agitation, hyperactivity, polyphagia   Amiodarone Nausea And Vomiting and Other (See Comments)   Hydrocodone-Acetaminophen Other (See Comments)    Hallucinations and combative   Tapentadol Itching   Adhesive [Tape] Itching and Rash    Prefers paper tape   Latex Itching and Rash    use paper tap   Oxycodone Itching    Can take it with benadryl   Tramadol Itching    Can take it with benadryl   Wound Dressing Adhesive     Family History  Problem Relation Age of Onset   Cancer Mother        leukemia   Cancer Father 83       colon   Heart disease Father    Heart attack Father 47       died from MI   Hyperlipidemia Father    Hypertension Father    Cancer Sister 33       ovarian   Depression Sister    Cancer Brother        CLL   Breast cancer Paternal Aunt        40's    Migraines Neg Hx    Thyroid cancer Neg Hx      Prior to Admission medications   Medication Sig Start Date End Date Taking? Authorizing Provider  acetaminophen (TYLENOL) 500 MG tablet Take 2 tablets (1,000 mg total) by mouth every 8 (eight) hours. 09/12/23  Yes Evon Slack, PA-C  ALPRAZolam Prudy Feeler) 0.25  MG tablet TAKE 1 TABLET BY MOUTH TWICE A DAY AS NEEDED FOR ANXIETY 08/08/23  Yes Arnett, Lyn Records, FNP  bisoprolol (ZEBETA) 10 MG tablet Take 1 tablet (10 mg total) by mouth 2 (two) times daily. 02/07/23  Yes Gollan, Tollie Pizza, MD  busPIRone (BUSPAR) 5 MG tablet TAKE 1 TABLET BY MOUTH THREE TIMES A DAY AS NEEDED 09/11/23  Yes Allegra Grana, FNP  Calcium Carbonate (CALCIUM 500 PO) Take 1 tablet by mouth daily.   Yes [provider]  Cholecalciferol (VITAMIN D3) 5000 units CAPS Take 5,000 Units by mouth daily.   Yes [provider]  docusate sodium (COLACE) 100 MG capsule Take 1 capsule (100 mg total) by mouth 2 (two) times daily. 09/12/23  Yes Gaines, Thomas C, PA-C  ELIQUIS 5 MG TABS tablet TAKE 1 TABLET BY MOUTH TWICE A DAY 08/21/23  Yes Gollan, Tollie Pizza, MD  escitalopram (LEXAPRO) 10 MG tablet Take 1 tablet (10 mg total) by mouth daily. 07/14/23  Yes Arnett, Lyn Records, FNP  furosemide (LASIX) 20 MG tablet Take 1 tablet (20 mg total) by mouth daily. May take additional 20mg  PO prn for leg swelling 03/14/23  Yes Arnett, Lyn Records, FNP  losartan-hydrochlorothiazide (HYZAAR) 50-12.5 MG tablet Take 0.5 tablets by mouth daily as needed (for BP 140/80). 07/14/23  Yes Allegra Grana, FNP  Multiple Vitamin (MULTIVITAMIN) tablet Take 1 tablet by mouth daily.   Yes [provider]  ondansetron (ZOFRAN) 4 MG tablet Take 1 tablet (4 mg total) by mouth every 6 (six) hours as needed for nausea. 09/12/23  Yes Evon Slack, PA-C  oxyCODONE (ROXICODONE) 5 MG immediate release tablet Take 0.5-1 tablets (2.5-5 mg total) by mouth every 8 (eight) hours as needed. 09/12/23 09/11/24 Yes  Evon Slack, PA-C  potassium chloride SA (KLOR-CON M) 20 MEQ tablet Take 1 tablet (20 mEq total) by mouth daily. Patient taking differently: Take 20 mEq by mouth every morning. And if she takes additional Lasix she will take another K+ 11/08/22  Yes Gollan, Tollie Pizza, MD  ranolazine (RANEXA) 500 MG 12 hr tablet TAKE 1 TABLET BY MOUTH TWICE A DAY 03/14/23  Yes Gollan, Tollie Pizza, MD  rOPINIRole (REQUIP) 2 MG tablet Take 2 mg by mouth at bedtime. 08/21/23  Yes [provider]  rosuvastatin (CRESTOR) 40 MG tablet TAKE 1 TABLET BY MOUTH EVERY DAY 03/10/23  Yes Arnett, Lyn Records, FNP  traMADol (ULTRAM) 50 MG tablet Take 1 tablet (50 mg total) by mouth every 6 (six) hours as needed for moderate pain (pain score 4-6). 09/12/23  Yes Evon Slack, PA-C  prochlorperazine (COMPAZINE) 10 MG tablet Take 1 tablet (10 mg total) by mouth every 6 (six) hours as needed (Nausea or vomiting). 03/19/21 03/02/22  [name], MD    Physical Exam: Vitals:   10/16/23 0014 10/16/23 0100 10/16/23 0225 10/16/23 0727  BP: (!) 151/54 117/75 (!) 110/49 (!) 112/50  Pulse: 65 69 71 70  Resp: 18  17 16   Temp: 98.7 F (37.1 C)   98 F (36.7 C)  TempSrc: Oral   Oral  SpO2: 96% 98% 97% 98%  Weight:      Height:        Constitutional: NAD, calm, comfortable Vitals:   10/16/23 0014 10/16/23 0100 10/16/23 0225 10/16/23 0727  BP: (!) 151/54 117/75 (!) 110/49 (!) 112/50  Pulse: 65 69 71 70  Resp: 18  17 16   Temp: 98.7 F (37.1 C)   98 F (36.7  C)  TempSrc: Oral   Oral  SpO2: 96% 98% 97% 98%  Weight:      Height:       Eyes: PERRL, lids and conjunctivae normal ENMT: Mucous membranes are moist. Posterior pharynx clear of any exudate or lesions.Normal dentition.  Neck: normal, supple, no masses, no thyromegaly Respiratory: clear to auscultation bilaterally, no wheezing, no crackles. Normal respiratory effort. No accessory muscle use.  Cardiovascular: Regular rate and rhythm, no murmurs / rubs / gallops. No  extremity edema. 2+ pedal pulses. No carotid bruits.  Abdomen: no tenderness, no masses palpated. No hepatosplenomegaly. Bowel sounds positive.  Musculoskeletal: no clubbing / cyanosis. No joint deformity upper and lower extremities. Good ROM, no contractures. Normal muscle tone.  Mild tenderness on muscle and soft tissue around left hip, normal ROM with passive movement of left hip.  Did not try to stand patient up because of the severe pain Skin: no rashes, lesions, ulcers. No induration Neurologic: CN 2-12 grossly intact. Sensation intact, DTR normal. Strength 5/5 in all 4.  Psychiatric: Normal judgment and insight. Alert and oriented x 3. Normal mood.     Labs on Admission: I have personally reviewed following labs and imaging studies  CBC: Recent Labs  Lab 10/16/23 0107  WBC 5.5  NEUTROABS 4.0  HGB 10.1*  HCT 31.1*  MCV 94.5  PLT 172   Basic Metabolic Panel: Recent Labs  Lab 10/16/23 0107  NA 135  K 4.0  CL 103  CO2 23  GLUCOSE 96  BUN 18  CREATININE 0.86  CALCIUM 8.2*   GFR: Estimated Creatinine Clearance: 74.4 mL/min (by C-G formula based on SCr of 0.86 mg/dL). Liver Function Tests: No results for input(s): "AST", "ALT", "ALKPHOS", "BILITOT", "PROT", "ALBUMIN" in the last 168 hours. No results for input(s): "LIPASE", "AMYLASE" in the last 168 hours. No results for input(s): "AMMONIA" in the last 168 hours. Coagulation Profile: No results for input(s): "INR", "PROTIME" in the last 168 hours. Cardiac Enzymes: No results for input(s): "CKTOTAL", "CKMB", "CKMBINDEX", "TROPONINI" in the last 168 hours. BNP (last 3 results) No results for input(s): "PROBNP" in the last 8760 hours. HbA1C: No results for input(s): "HGBA1C" in the last 72 hours. CBG: No results for input(s): "GLUCAP" in the last 168 hours. Lipid Profile: No results for input(s): "CHOL", "HDL", "LDLCALC", "TRIG", "CHOLHDL", "LDLDIRECT" in the last 72 hours. Thyroid Function Tests: No results for  input(s): "TSH", "T4TOTAL", "FREET4", "T3FREE", "THYROIDAB" in the last 72 hours. Anemia Panel: No results for input(s): "VITAMINB12", "FOLATE", "FERRITIN", "TIBC", "IRON", "RETICCTPCT" in the last 72 hours. Urine analysis:    Component Value Date/Time   COLORURINE STRAW (A) 09/04/2023 1512   APPEARANCEUR CLEAR (A) 09/04/2023 1512   APPEARANCEUR Clear 10/10/2014 2137   LABSPEC 1.009 09/04/2023 1512   LABSPEC 1.006 10/10/2014 2137   PHURINE 5.0 09/04/2023 1512   GLUCOSEU NEGATIVE 09/04/2023 1512   GLUCOSEU Negative 10/10/2014 2137   HGBUR SMALL (A) 09/04/2023 1512   BILIRUBINUR NEGATIVE 09/04/2023 1512   BILIRUBINUR neg 05/17/2016 1408   BILIRUBINUR Negative 10/10/2014 2137   KETONESUR NEGATIVE 09/04/2023 1512   PROTEINUR NEGATIVE 09/04/2023 1512   UROBILINOGEN 0.2 05/17/2016 1408   NITRITE NEGATIVE 09/04/2023 1512   LEUKOCYTESUR SMALL (A) 09/04/2023 1512   LEUKOCYTESUR Negative 10/10/2014 2137    Radiological Exams on Admission: MR LUMBAR SPINE WO CONTRAST Result Date: 10/16/2023 CLINICAL DATA:  73 year old female with low back pain, left side pain radiating to the hip. EXAM: MRI LUMBAR SPINE WITHOUT CONTRAST TECHNIQUE: Multiplanar, multisequence  MR imaging of the lumbar spine was performed. No intravenous contrast was administered. COMPARISON:  Lumbar MRI 02/23/2018. CT Abdomen and Pelvis 07/12/2021. FINDINGS: Segmentation: Normal segmentation on the CT comparison, same numbering system as on the 2019 MRI. Alignment: Generally stable lumbar lordosis since 2019. mild chronic retrolisthesis at L1-L2 and L2-L3 is stable. Similar new retrolisthesis at L3-L4. Subtle chronic anterolisthesis at L4-L5 is stable. Mild dextroconvex chronic lumbar scoliosis. Vertebrae: Normal background bone marrow signal. Stable vertebral height since 2019. Chronic degenerative endplate changes with a mix of both acute and chronic degenerative marrow signal changes including mild marrow edema inferiorly at the L1  endplate, at the left L4 superior endplate. Occasional benign vertebral hemangiomas (no follow-up imaging recommended). Intact visible sacrum and SI joints. Chronic postoperative changes as detailed below. Conus medullaris and cauda equina: Conus extends to the L1 level. No lower spinal cord or conus signal abnormality. Paraspinal and other soft tissues: Chronic benign renal cysts (no follow-up imaging recommended). Negative visible other abdominal viscera. Chronic mild postoperative changes to the posterior paraspinal soft tissues. Disc levels: Visible lower thoracic levels through T12-L1:  Negative. L1-L2: Progressed disc space loss since 2019. Circumferential disc osteophyte complex asymmetric to the right. Mild facet and ligament flavum hypertrophy. Epidural lipomatosis. New mild spinal stenosis. Mild left and moderate right L1 foraminal stenosis have increased. L2-L3: Progressed disc space loss with circumferential disc osteophyte complex asymmetric to the right. Mild facet and ligament flavum hypertrophy. Moderate new spinal stenosis (series 8, image 13). Moderate right lateral recess stenosis is new (right L3 nerve level). Moderate to severe right L2 foraminal stenosis has increased. Stable mild left foraminal stenosis. L3-L4: Posterior decompression, laminectomy in the midline since 2019. Severe residual facet and ligament flavum hypertrophy greater on the left. Circumferential disc osteophyte complex asymmetric to the left. Mild residual spinal and lateral recess stenosis. Severe left L3 foraminal stenosis is largely new since 2019 (series 5, image 14). A small posterior synovial cyst just below the disc space does not meaningfully contribute to stenosis (series 6, image 10). Right L3 foraminal stenosis is mild. L4-L5: Chronic grade 1 anterolisthesis. Chronic midline posterior decompression. Stable moderate facet hypertrophy greater on the left. No stenosis. L5-S1: Disc space loss with similar chronic  circumferential disc osteophyte complex asymmetric to the right. Mild facet and ligament flavum hypertrophy. No spinal or lateral recess stenosis. Mild to moderate bilateral L5 foraminal stenosis is stable. IMPRESSION: 1. Stable chronic lower lumbar spine degeneration at L4-L5 and L5-S1, the former chronically decompressed posteriorly. 2. Midline posterior L3-L4 posterior decompression since 2019, but severe residual facet and posterior element hypertrophy greater on the left. Severe new left foraminal stenosis, query left L3 radiculitis. Mild residual spinal and lateral recess stenosis. 3. Progressed degeneration and spinal stenosis also at L1-L2 and L2-L3 since 2019, moderate at the latter. Increased right greater than left neural foraminal stenosis at those levels. Electronically Signed   By: Odessa Fleming M.D.   On: 10/16/2023 05:33   DG Hip Unilat W or Wo Pelvis 2-3 Views Left Result Date: 10/15/2023 CLINICAL DATA:  pain EXAM: DG HIP (WITH OR WITHOUT PELVIS) 2-3V LEFT COMPARISON:  MRI left hip 08/08/2023 trauma x-ray left hip 05/11/2023 FINDINGS: Total bilateral hip arthroplasty. No radiographic findings to suggest surgical hardware complication. There is no evidence of hip fracture or dislocation. No acute displaced fracture or diastasis of the bones of the pelvis. There is no evidence of arthropathy or other focal bone abnormality. IMPRESSION: Negative for acute traumatic injury. Total bilateral  hip arthroplasty. Electronically Signed   By: Tish Frederickson M.D.   On: 10/15/2023 23:34    EKG:  Assessment/Plan Principal Problem:   Left hip pain  (please populate well all problems here in Problem List. (For example, if patient is on BP meds at home and you resume or decide to hold them, it is a problem that needs to be her. Same for CAD, COPD, HLD and so on)  Acute ambulatory impairment Acute left hip pain -MRI pending, consider orthopedic surgery consultation if left hip MRI positive -Pain  management -PT evaluation pending MRI findings -Other DDx, she has no fever or leukocytosis, low suspicion for septic arthritis  History of PAF status post ablation-in sinus rhythm - Continue Eliquis and bisoprolol  History of HFpEF-euvolemic, - Continue Lasix  HLD - Continue statin  COPD -No acute concern, continue as needed inhalers  Morbid obesity - BMI 39 - Recommend calorie control   DVT prophylaxis: Eliquis Code Status: Full code Family Communication: Husband over the phone Disposition Plan: Expect less than 2 midnight hospital stay Consults called: None Admission status: MedSurg observation   Emeline General MD [facility] Pager [phone]  10/16/2023, 9:49 AM

## 2023-10-16 NOTE — Consult Note (Signed)
 ORTHOPAEDIC CONSULTATION  REQUESTING PHYSICIAN: Emeline General, MD  Chief Complaint:   Left groin/thigh pain  History of Present Illness: Amber Barnes is a 73 y.o. female medical history significant of multiple OA status post left hip replacement and recent left knee replacement, HTN, PAF status post ablation on Eliquis, chronic HFpEF, HLD, breast cancer status post chemo/immunotherapy/radiation and surgery, hypothyroidism who presented with worsening left groin and thigh pain which was radiating from her left lower back around her left groin into her medial thigh.  She reports this increased after recent physical therapy that she has been doing for a left total knee replacement which she had done a month ago.  Her recovery for the knee has been uneventful and she has been improving well with regards to the function of her left knee.  She denies any fevers or chills does report that she has intermittent night sweats.  Initially after her left hip replacement she did well and had a little bit of early postoperative drainage and was found to have a seroma.  It was noninfectious appearing and she progressed well without any issues.  She reached out to the office earlier today due to increasing pain and came to the emergency room for evaluation.  Blood work showed normal white count x-rays showed intact hardware without any issues.  An MRI was performed and inflammatory labs were obtained which showed elevated inflammatory labs and redemonstration of fluid over the anterior compartment of the left hip.  Orthopedics was consulted for evaluation. Patient reports since admission to the hospital her pain has improved and she recently walked a few laps around the nursing station.  She reports the pain is worse when she stands elevated with her back extended and goes when she leans forward or sits down.  She does report some initial left thigh  weakness which associated with the pain and then improved.  She denies any numbness or tingling at this time.  She reports a history of back pain but that the pain coming down into her leg like this was new.  Past Medical History:  Diagnosis Date   Achilles tendinitis    Acute medial meniscal tear    Allergy    Anemia    Anxiety    a.) on BZO PRN (alprazolam)   Aortic atherosclerosis (HCC)    Ascending aorta dilatation (HCC) 10/20/2022   a.) TTE 10/20/2022: asc Ao 39 mm   Atrial fibrillation/flutter (HCC) 07/2016   a.) Dx'd 07/2016; b.) CHA2DS2-VASc = 5 (age, sex, HFmrEF, HTN, vasc disease history) as of 05/09/2023; c.) s/p DCCV 09/07/2016 (150 J x 2 --> A.flutter; 200 J x 1 --> NSR); d.) s/p DCCV 10/19/2016 (150 J x 1 --> NSR); e.) brought in for DCCV 12/2016; cancelled d/t NSR; f.) s/p PVI ablation 12/12/2022; g.) cardiac rate/rhythm maintained on oral bisoprolol; chronically anticoagulated using apixaban   BMI 50.0-59.9, adult (HCC) 07/16/2018   CAD (coronary artery disease)    a.) cCTA 12/08/2022: Ca2+ = 31.6 (58th %'ile for age/sex/race match control)   Cardiomegaly    Chemotherapy induced nausea and vomiting 05/18/2021   Chronic heart failure with mid-range ejection fraction (HFmrEF) (HCC)    a.) TTE 03/08/21: EF 60-65, mild LVH, G1DD, mild LAE, triv MR; b.) TTE 07/29/21: EF 60-65, G1DD, mild LAE, triv MR; c.) TTE 11/26/21: EF 55-60, G1DD, mild LAE, mild MR; d.) TTE 02/25/22: EF 60-65, G1DD, mild LAE, mild MR; e.) TTE 05/31/22: EF 55-60, mild LVH, G2DD, mild-mod MR; f.) TTE 10/20/22:  EF 45-50, mild LAE, mod red RVSF, mild-mod TR, Asc Ao 39; g.) TTE 12/13/22: EF 50-55, mild LVH, mild MAC   CKD (chronic kidney disease), stage II    Colon polyp    Complication of anesthesia    a.) emergence delirium; b.) hypoxia after hip replacement   COPD (chronic obstructive pulmonary disease) (HCC)    DDD (degenerative disc disease), lumbar    a.) s/p fusion   Depression    Endometriosis    Fibrocystic  breast disease    GERD (gastroesophageal reflux disease)    History of kidney stones    History of stress test    a.) MV 07/29/2016: EF 55-65%. Small, mild defect  in mid anteroseptal, apical septal, and apical locations. No ischemia-->low risk; b.) MV 10/2022: mod sized partially rev apical ant, apical septal, and apical defect. EF 52%. No signific cor Ca2+; c.) cCTA 12/08/2022: Ca2+ = 31.6 (58th %'ile).   History of tobacco use    Hyperlipidemia    Hypertension    Hypothyroidism    a.) s/p partial thyroidectomy for nodule; pathology benign   Insomnia    Left knee pain, unspecified chronicity    Lipoma    Malignant neoplasm of upper-outer quadrant of right female breast (HCC) 03/03/2021   a.) stage 1A invasive mammary carcinoma (cT1b or T1c N0); ER (-), PR (+), HER2/neu (+); did not tolerate adjuvant chemotherapy (paclitacel + trastuzumab) despite paclitaxel dose reduction; s/p adjuvant XRT; cinished maintenance trastuzumab 07/27/2022; no adjuvant endocrine therapy as no clear benefit   Migraine    Nephrolithiasis    On apixaban therapy    OSA (obstructive sleep apnea)    a.) non-compliant with nocturnal PAP therapy   Osteoarthritis    Osteopenia    Peptic ulcer disease    a.) H. pylori   Pericarditis 12/12/2022   a.) following A.fib ablation; resolved   Pneumonia    Port-A-Cath in place    RLS (restless legs syndrome)    a.) on ropinirole   Vitamin D deficiency    Past Surgical History:  Procedure Laterality Date   ABDOMINAL HYSTERECTOMY     ovaries left   ACHILLES TENDON SURGERY     2012,01/2017   ATRIAL FIBRILLATION ABLATION N/A 12/12/2022   Procedure: ATRIAL FIBRILLATION ABLATION;  Surgeon: Lanier Prude, MD;  Location: [facility];  Service: Cardiovascular;  Laterality: N/A;   BREAST BIOPSY Right 01/15/2021   Affirm biopsy-"X" clip-INVASIVE MAMMARY CARCINOMA   BREAST BIOPSY Right 01/15/2021   u/s bx-"venus" clip INVASIVE MAMMARY CARCINOMA   BREAST BIOPSY      BREAST BIOPSY Right 01/04/2023   u/s bx ribbon clip path pending   BREAST BIOPSY Right 01/04/2023   [place] RT BREAST BX W LOC DEV 1ST LESION IMG BX SPEC US GUIDE 01/04/2023 [facility]   BREAST LUMPECTOMY,RADIO FREQ LOCALIZER,AXILLARY SENTINEL LYMPH NODE BIOPSY Right 02/17/2021   Procedure: BREAST LUMPECTOMY,RADIO FREQ LOCALIZER,AXILLARY SENTINEL LYMPH NODE BIOPSY;  Surgeon: Campbell Lerner, MD;  Location: [facility];  Service: General;  Laterality: Right;   CARDIOVERSION N/A 09/07/2016   Procedure: CARDIOVERSION;  Surgeon: Antonieta Iba, MD;  Location: [facility];  Service: Cardiovascular;  Laterality: N/A;   CARDIOVERSION N/A 10/19/2016   Procedure: CARDIOVERSION;  Surgeon: Antonieta Iba, MD;  Location: [facility];  Service: Cardiovascular;  Laterality: N/A;   CARDIOVERSION N/A 12/14/2016   Procedure: CARDIOVERSION;  Surgeon: Antonieta Iba, MD;  Location: [facility];  Service: Cardiovascular;  Laterality: N/A;   COLONOSCOPY  COLONOSCOPY WITH PROPOFOL N/A 05/13/2020   Procedure: COLONOSCOPY WITH PROPOFOL;  Surgeon: Toledo, Boykin Nearing, MD;  Location: [facility];  Service: Gastroenterology;  Laterality: N/A;  ELIQUIS   CYSTOSCOPY WITH STENT PLACEMENT Bilateral 07/12/2021   Procedure: CYSTOSCOPY WITH STENT PLACEMENT;  Surgeon: Sondra Come, MD;  Location: [facility];  Service: Urology;  Laterality: Bilateral;   CYSTOSCOPY/URETEROSCOPY/HOLMIUM LASER/STENT PLACEMENT Bilateral 07/30/2021   Procedure: CYSTOSCOPY/URETEROSCOPY/HOLMIUM LASER/BILATERAL STENT REMOVAL;  Surgeon: Sondra Come, MD;  Location: [facility];  Service: Urology;  Laterality: Bilateral;   ESOPHAGOGASTRODUODENOSCOPY     ESOPHAGOGASTRODUODENOSCOPY (EGD) WITH PROPOFOL N/A 05/13/2020   Procedure: ESOPHAGOGASTRODUODENOSCOPY (EGD) WITH PROPOFOL;  Surgeon: Toledo, Boykin Nearing, MD;  Location: [facility];  Service: Gastroenterology;  Laterality: N/A;   FOOT SURGERY Left    tendon   LUMBAR FUSION     PORTA CATH INSERTION  N/A 03/23/2021   Procedure: PORTA CATH INSERTION;  Surgeon: Renford Dills, MD;  Location: [facility];  Service: Cardiovascular;  Laterality: N/A;   PORTACATH PLACEMENT N/A 03/23/2021   Procedure: ATTEMPTED INSERTION PORT-A-CATH;  Surgeon: Campbell Lerner, MD;  Location: [facility];  Service: General;  Laterality: N/A;   THYROIDECTOMY  1990   half thyroid lobectomy secondary to a benign nodule   TOTAL HIP ARTHROPLASTY Right 08/21/2018   Procedure: TOTAL HIP ARTHROPLASTY ANTERIOR APPROACH-RIGHT CELL SAVER REQUESTED;  Surgeon: Kennedy Bucker, MD;  Location: [facility];  Service: Orthopedics;  Laterality: Right;   TOTAL HIP ARTHROPLASTY Left 05/11/2023   Procedure: TOTAL HIP ARTHROPLASTY ANTERIOR APPROACH;  Surgeon: Reinaldo Berber, MD;  Location: [facility];  Service: Orthopedics;  Laterality: Left;   TOTAL KNEE ARTHROPLASTY Left 09/11/2023   Procedure: ARTHROPLASTY, KNEE, TOTAL;  Surgeon: Reinaldo Berber, MD;  Location: [facility];  Service: Orthopedics;  Laterality: Left;   Social History   Socioeconomic History   Marital status: Married    Spouse name: jeffrey   Number of children: 1   Years of education: Not on file   Highest education level: 12th grade  Occupational History    Comment: disabled  Tobacco Use   Smoking status: Former    Current packs/day: 0.00    Average packs/day: 0.3 packs/day for 10.0 years (2.5 ttl pk-yrs)    Types: Cigarettes    Start date: 73    Quit date: 2005    Years since quitting: 20.2    Passive exposure: Past   Smokeless tobacco: Never   Tobacco comments:    smoked for 10 years, 1 pack every 2-3 days  Vaping Use   Vaping status: Never Used  Substance and Sexual Activity   Alcohol use: Yes    Alcohol/week: 1.0 standard drink of alcohol    Types: 1 Glasses of wine per week    Comment: margarita qd   Drug use: Yes    Types: Marijuana   Sexual activity: Not Currently  Other Topics Concern   Not on file  Social History Narrative    Moved to [place] from [place], originally from [place].    1 cup of caffeine daily   Right handed   Lives with husband, step-daughter and child       Social Drivers of Health   Financial Resource Strain: Low Risk  (08/23/2023)   Received [facility]   Overall Financial Resource Strain (CARDIA)    Difficulty of Paying Living Expenses: Not very hard  Food Insecurity: No Food Insecurity (10/16/2023)   Hunger Vital Sign    Worried  About Running Out of Food in the Last Year: Never true    Ran Out of Food in the Last Year: Never true  Transportation Needs: No Transportation Needs (10/16/2023)   PRAPARE - Administrator, Civil Service (Medical): No    Lack of Transportation (Non-Medical): No  Physical Activity: Insufficiently Active (07/14/2023)   Exercise Vital Sign    Days of Exercise per Week: 4 days    Minutes of Exercise per Session: 20 min  Stress: Stress Concern Present (07/14/2023)   [employer] of Occupational Health - Occupational Stress Questionnaire    Feeling of Stress : To some extent  Social Connections: Socially Integrated (10/16/2023)   Social Connection and Isolation Panel [NHANES]    Frequency of Communication with Friends and Family: More than three times a week    Frequency of Social Gatherings with Friends and Family: More than three times a week    Attends Religious Services: More than 4 times per year    Active Member of [employer] or Organizations: Yes    Attends Engineer, structural: More than 4 times per year    Marital Status: Married   Family History  Problem Relation Age of Onset   Cancer Mother        leukemia   Cancer Father 27       colon   Heart disease Father    Heart attack Father 30       died from MI   Hyperlipidemia Father    Hypertension Father    Cancer Sister 56       ovarian   Depression Sister    Cancer Brother        CLL   Breast cancer Paternal Aunt        40's    Migraines Neg Hx    Thyroid cancer Neg Hx    Allergies  Allergen Reactions   Hydrocodone    Prednisone Other (See Comments)    Agitation, hyperactivity, polyphagia   Amiodarone Nausea And Vomiting and Other (See Comments)   Hydrocodone-Acetaminophen Other (See Comments)    Hallucinations and combative   Tapentadol Itching   Adhesive [Tape] Itching and Rash    Prefers paper tape   Latex Itching and Rash    use paper tap   Oxycodone Itching    Can take it with benadryl   Tramadol Itching    Can take it with benadryl   Wound Dressing Adhesive    Prior to Admission medications   Medication Sig Start Date End Date Taking? Authorizing Provider  acetaminophen (TYLENOL) 500 MG tablet Take 2 tablets (1,000 mg total) by mouth every 8 (eight) hours. 09/12/23  Yes Evon Slack, PA-C  ALPRAZolam Prudy Feeler) 0.25 MG tablet TAKE 1 TABLET BY MOUTH TWICE A DAY AS NEEDED FOR ANXIETY 08/08/23  Yes Arnett, Lyn Records, FNP  bisoprolol (ZEBETA) 10 MG tablet Take 1 tablet (10 mg total) by mouth 2 (two) times daily. 02/07/23  Yes Gollan, Tollie Pizza, MD  busPIRone (BUSPAR) 5 MG tablet TAKE 1 TABLET BY MOUTH THREE TIMES A DAY AS NEEDED 09/11/23  Yes Allegra Grana, FNP  Calcium Carbonate (CALCIUM 500 PO) Take 1 tablet by mouth daily.   Yes [provider]  Cholecalciferol (VITAMIN D3) 5000 units CAPS Take 5,000 Units by mouth daily.   Yes [provider]  docusate sodium (COLACE) 100 MG capsule Take 1 capsule (100 mg total) by mouth 2 (two) times daily. 09/12/23  Yes Floyce Stakes,  Thomas C, PA-C  ELIQUIS 5 MG TABS tablet TAKE 1 TABLET BY MOUTH TWICE A DAY 08/21/23  Yes Gollan, Tollie Pizza, MD  escitalopram (LEXAPRO) 10 MG tablet Take 1 tablet (10 mg total) by mouth daily. 07/14/23  Yes Arnett, Lyn Records, FNP  furosemide (LASIX) 20 MG tablet Take 1 tablet (20 mg total) by mouth daily. May take additional 20mg  PO prn for leg swelling 03/14/23  Yes Arnett, Lyn Records, FNP  losartan-hydrochlorothiazide (HYZAAR)  50-12.5 MG tablet Take 0.5 tablets by mouth daily as needed (for BP 140/80). 07/14/23  Yes Allegra Grana, FNP  Multiple Vitamin (MULTIVITAMIN) tablet Take 1 tablet by mouth daily.   Yes [provider]  ondansetron (ZOFRAN) 4 MG tablet Take 1 tablet (4 mg total) by mouth every 6 (six) hours as needed for nausea. 09/12/23  Yes Evon Slack, PA-C  oxyCODONE (ROXICODONE) 5 MG immediate release tablet Take 0.5-1 tablets (2.5-5 mg total) by mouth every 8 (eight) hours as needed. 09/12/23 09/11/24 Yes Evon Slack, PA-C  potassium chloride SA (KLOR-CON M) 20 MEQ tablet Take 1 tablet (20 mEq total) by mouth daily. Patient taking differently: Take 20 mEq by mouth every morning. And if she takes additional Lasix she will take another K+ 11/08/22  Yes Gollan, Tollie Pizza, MD  ranolazine (RANEXA) 500 MG 12 hr tablet TAKE 1 TABLET BY MOUTH TWICE A DAY 03/14/23  Yes Gollan, Tollie Pizza, MD  rOPINIRole (REQUIP) 2 MG tablet Take 2 mg by mouth at bedtime. 08/21/23  Yes [provider]  rosuvastatin (CRESTOR) 40 MG tablet TAKE 1 TABLET BY MOUTH EVERY DAY 03/10/23  Yes Arnett, Lyn Records, FNP  traMADol (ULTRAM) 50 MG tablet Take 1 tablet (50 mg total) by mouth every 6 (six) hours as needed for moderate pain (pain score 4-6). 09/12/23  Yes Evon Slack, PA-C  prochlorperazine (COMPAZINE) 10 MG tablet Take 1 tablet (10 mg total) by mouth every 6 (six) hours as needed (Nausea or vomiting). 03/19/21 03/02/22  [name], MD   MR HIP LEFT WO CONTRAST Result Date: 10/16/2023 CLINICAL DATA:  Increasing left hip pain. History of left hip replacement in October 2024. EXAM: MR OF THE LEFT HIP WITHOUT CONTRAST TECHNIQUE: Multiplanar, multisequence MR imaging was performed. No intravenous contrast was administered. COMPARISON:  MRI of the left hip dated 07/31/2023. Left hip radiographs dated 10/15/2023. FINDINGS: Bone Status post bilateral hip arthroplasties with associated susceptibility artifact obscuring the adjacent  osseous structures and soft tissue. Increased size of a fluid collection along the surgical tract within the left proximal anterior thigh, deep to the incision site, currently measuring 7.5 x 3.4 x 6.2 cm (series 11, image 33 and series 10, image 32), previously 2.3 x 1.2 x 5 cm. This irregular lobulated collection demonstrates heterogenous fluid signal intensity and insinuates between the overlying tensor fascia lata and rectus femoris muscles and between the vastus lateralis and iliopsoas muscle. There is surrounding soft tissue and intramuscular edema. This collection extends towards the level of the left prosthetic hip joint. Additional smaller rounded heterogenous collection in the proximal left vastus lateralis muscle measuring 1.2 x 1.0 x 1.4 cm (series 11, image 35). There is marrow edema of the left greater trochanter. SI joints are unremarkable. Soft tissue and Muscle: Postsurgical changes the left hip with soft tissue fluid collections, as described above. Enlarged left external iliac chain node measures 12 mm in short axis. Several additional prominent left pelvic and inguinal nodes are likely reactive. Visualized intrapelvic contents are  grossly unremarkable. IMPRESSION: 1. Increased size of a 7.5 x 6.2 x 3.4 cm heterogenous fluid collection along the surgical tract within the left proximal anterior thigh, deep to the incision site. This is concerning for abscess given the presence of surrounding soft tissue and intramuscular edema, although, sterility remains indeterminate. The collection insinuates between the anterior compartment musculature of the left proximal thigh and extends towards the level of the left prosthetic hip joint, septic joint can not be excluded. Additional smaller 1.4 x 1.2 x 1.0 cm fluid collection in the proximal left vastus lateralis muscle. 2. Left total hip arthroplasty with associated susceptibility artifact obscuring the adjacent osseous structures and soft tissue. There is  marrow edema of the left greater trochanter, which may be reactive, however, osteomyelitis can not be excluded. 3. Enlarged and prominent left pelvic and inguinal lymph nodes, likely reactive. Electronically Signed   By: Hart Robinsons M.D.   On: 10/16/2023 12:15   MR LUMBAR SPINE WO CONTRAST Result Date: 10/16/2023 CLINICAL DATA:  73 year old female with low back pain, left side pain radiating to the hip. EXAM: MRI LUMBAR SPINE WITHOUT CONTRAST TECHNIQUE: Multiplanar, multisequence MR imaging of the lumbar spine was performed. No intravenous contrast was administered. COMPARISON:  Lumbar MRI 02/23/2018. CT Abdomen and Pelvis 07/12/2021. FINDINGS: Segmentation: Normal segmentation on the CT comparison, same numbering system as on the 2019 MRI. Alignment: Generally stable lumbar lordosis since 2019. mild chronic retrolisthesis at L1-L2 and L2-L3 is stable. Similar new retrolisthesis at L3-L4. Subtle chronic anterolisthesis at L4-L5 is stable. Mild dextroconvex chronic lumbar scoliosis. Vertebrae: Normal background bone marrow signal. Stable vertebral height since 2019. Chronic degenerative endplate changes with a mix of both acute and chronic degenerative marrow signal changes including mild marrow edema inferiorly at the L1 endplate, at the left L4 superior endplate. Occasional benign vertebral hemangiomas (no follow-up imaging recommended). Intact visible sacrum and SI joints. Chronic postoperative changes as detailed below. Conus medullaris and cauda equina: Conus extends to the L1 level. No lower spinal cord or conus signal abnormality. Paraspinal and other soft tissues: Chronic benign renal cysts (no follow-up imaging recommended). Negative visible other abdominal viscera. Chronic mild postoperative changes to the posterior paraspinal soft tissues. Disc levels: Visible lower thoracic levels through T12-L1:  Negative. L1-L2: Progressed disc space loss since 2019. Circumferential disc osteophyte complex  asymmetric to the right. Mild facet and ligament flavum hypertrophy. Epidural lipomatosis. New mild spinal stenosis. Mild left and moderate right L1 foraminal stenosis have increased. L2-L3: Progressed disc space loss with circumferential disc osteophyte complex asymmetric to the right. Mild facet and ligament flavum hypertrophy. Moderate new spinal stenosis (series 8, image 13). Moderate right lateral recess stenosis is new (right L3 nerve level). Moderate to severe right L2 foraminal stenosis has increased. Stable mild left foraminal stenosis. L3-L4: Posterior decompression, laminectomy in the midline since 2019. Severe residual facet and ligament flavum hypertrophy greater on the left. Circumferential disc osteophyte complex asymmetric to the left. Mild residual spinal and lateral recess stenosis. Severe left L3 foraminal stenosis is largely new since 2019 (series 5, image 14). A small posterior synovial cyst just below the disc space does not meaningfully contribute to stenosis (series 6, image 10). Right L3 foraminal stenosis is mild. L4-L5: Chronic grade 1 anterolisthesis. Chronic midline posterior decompression. Stable moderate facet hypertrophy greater on the left. No stenosis. L5-S1: Disc space loss with similar chronic circumferential disc osteophyte complex asymmetric to the right. Mild facet and ligament flavum hypertrophy. No spinal or lateral recess stenosis. Mild  to moderate bilateral L5 foraminal stenosis is stable. IMPRESSION: 1. Stable chronic lower lumbar spine degeneration at L4-L5 and L5-S1, the former chronically decompressed posteriorly. 2. Midline posterior L3-L4 posterior decompression since 2019, but severe residual facet and posterior element hypertrophy greater on the left. Severe new left foraminal stenosis, query left L3 radiculitis. Mild residual spinal and lateral recess stenosis. 3. Progressed degeneration and spinal stenosis also at L1-L2 and L2-L3 since 2019, moderate at the  latter. Increased right greater than left neural foraminal stenosis at those levels. Electronically Signed   By: Odessa Fleming M.D.   On: 10/16/2023 05:33   DG Hip Unilat W or Wo Pelvis 2-3 Views Left Result Date: 10/15/2023 CLINICAL DATA:  pain EXAM: DG HIP (WITH OR WITHOUT PELVIS) 2-3V LEFT COMPARISON:  MRI left hip 08/08/2023 trauma x-ray left hip 05/11/2023 FINDINGS: Total bilateral hip arthroplasty. No radiographic findings to suggest surgical hardware complication. There is no evidence of hip fracture or dislocation. No acute displaced fracture or diastasis of the bones of the pelvis. There is no evidence of arthropathy or other focal bone abnormality. IMPRESSION: Negative for acute traumatic injury. Total bilateral hip arthroplasty. Electronically Signed   By: Tish Frederickson M.D.   On: 10/15/2023 23:34    Positive ROS: All other systems have been reviewed and were otherwise negative with the exception of those mentioned in the HPI and as above.  Physical Exam: General:  Alert, no acute distress Psychiatric:  Patient is competent for consent with normal mood and affect   Cardiovascular:  No pedal edema Respiratory:  No wheezing, non-labored breathing GI:  Abdomen is soft and non-tender Skin:  No lesions in the area of chief complaint Neurologic:  Sensation intact distally Lymphatic:  No axillary or cervical lymphadenopathy  Orthopedic Exam:  Left lower extremity Well-healed incision over the anterior left thigh and anterior knee no erythema or localized soft tissue swelling or any fluctuance noted Fullness noted over the left hip grossly unchanged from prior exams in the office No pain with range of motion of the hip or knee at rest no pain with micromotion Some pain in the groin with straight leg raise past 65 degrees no radiation Neurovascular intact distally compartments all soft  negative Homans Dorsiflex and plantarflex the foot and toes intact esophagus pulse  X-rays:  X-rays show  intact left total hip prosthesis unchanged from prior films. MRI images and report reviewed by myself show redemonstration of an anterior fluid collection over the hip in the tract of the prior hip surgery deep to the fascia with no sinus tract to the skin.  Slightly larger in size on this MRI compared MRI from January of this year.  With some mild edema in the surrounding musculature.  Agree with radiologist interpretation.    Assessment: Left hip pain/left L3 radiculitis/rule out septic left hip/left hip seroma  Plan: Reviewed the clinical and radiographic findings with the patient.  I do think significant percentage of her pain at this time is likely coming from compression of the left L3 nerve root based on her exam and the story of her pain.  However given the finding of inflammatory markers and the fluid collection I feel it is prudent to rule out any sort of infection around the left hip.  Patient agrees with this plan and I put in a consult with interventional radiology send message to the on-call provider to see if they can help obtain fluid aspiration of the collection.  Will keep the patient n.p.o.  after midnight and we did discuss that if there is a potential that communicates with the hip she may need surgical intervention with a potential washout or patient.  We did discuss differences in acuity, chronic versus acute infections of a hip and success rates if we would need to go in to the hip.  Will see what fluid shows once again aspiration and the plan from there.  With regards to the L3 nerve root I do recommend follow-up with neurosurgery once we have ruled down infection.    Reinaldo Berber MD  Beeper #:  [phone]  10/16/2023 5:18 PM

## 2023-10-16 NOTE — ED Provider Notes (Signed)
 [facility] Provider Note    Event Date/Time   First MD Initiated Contact with Patient 10/16/23 0015     (approximate)   History   Hip Pain (chronic)   HPI  Cortne Amara is a 73 y.o. female with history of atrial fibrillation, CAD, CHF, CKD, hypertension, hyperlipidemia who presents to the emergency department with left hip pain.  States that she thinks she may have overworked herself with physical therapy this week but pain has progressively worsened to the point that she is now barely able to move the hip without significant pain.  She denies any falls.  She has previously had a hip replacement by Dr. Audelia Acton in November 2024 and recently had left total knee replacement in March 2025.  Denies any calf pain or calf tenderness.  She denies fevers or chills but had a temperature of 99 here in the emergency department.  She states she normally ambulates with a walker.  She lives at home with her husband.  She states pain started in the anterior hip, moved laterally and is now in the buttocks and going into the lower back.  She does report prior history of back surgery about 15 years ago.  Denies numbness, tingling or focal weakness.  No bowel or bladder incontinence.  No urinary retention.   History provided by patient, son.    Past Medical History:  Diagnosis Date   Achilles tendinitis    Acute medial meniscal tear    Allergy    Anemia    Anxiety    a.) on BZO PRN (alprazolam)   Aortic atherosclerosis (HCC)    Ascending aorta dilatation (HCC) 10/20/2022   a.) TTE 10/20/2022: asc Ao 39 mm   Atrial fibrillation/flutter (HCC) 07/2016   a.) Dx'd 07/2016; b.) CHA2DS2-VASc = 5 (age, sex, HFmrEF, HTN, vasc disease history) as of 05/09/2023; c.) s/p DCCV 09/07/2016 (150 J x 2 --> A.flutter; 200 J x 1 --> NSR); d.) s/p DCCV 10/19/2016 (150 J x 1 --> NSR); e.) brought in for DCCV 12/2016; cancelled d/t NSR; f.) s/p PVI ablation 12/12/2022; g.) cardiac  rate/rhythm maintained on oral bisoprolol; chronically anticoagulated using apixaban   BMI 50.0-59.9, adult (HCC) 07/16/2018   CAD (coronary artery disease)    a.) cCTA 12/08/2022: Ca2+ = 31.6 (58th %'ile for age/sex/race match control)   Cardiomegaly    Chemotherapy induced nausea and vomiting 05/18/2021   Chronic heart failure with mid-range ejection fraction (HFmrEF) (HCC)    a.) TTE 03/08/21: EF 60-65, mild LVH, G1DD, mild LAE, triv MR; b.) TTE 07/29/21: EF 60-65, G1DD, mild LAE, triv MR; c.) TTE 11/26/21: EF 55-60, G1DD, mild LAE, mild MR; d.) TTE 02/25/22: EF 60-65, G1DD, mild LAE, mild MR; e.) TTE 05/31/22: EF 55-60, mild LVH, G2DD, mild-mod MR; f.) TTE 10/20/22: EF 45-50, mild LAE, mod red RVSF, mild-mod TR, Asc Ao 39; g.) TTE 12/13/22: EF 50-55, mild LVH, mild MAC   CKD (chronic kidney disease), stage II    Colon polyp    Complication of anesthesia    a.) emergence delirium; b.) hypoxia after hip replacement   COPD (chronic obstructive pulmonary disease) (HCC)    DDD (degenerative disc disease), lumbar    a.) s/p fusion   Depression    Endometriosis    Fibrocystic breast disease    GERD (gastroesophageal reflux disease)    History of kidney stones    History of stress test    a.) MV 07/29/2016: EF 55-65%. Small, mild defect  in mid anteroseptal, apical septal, and apical locations. No ischemia-->low risk; b.) MV 10/2022: mod sized partially rev apical ant, apical septal, and apical defect. EF 52%. No signific cor Ca2+; c.) cCTA 12/08/2022: Ca2+ = 31.6 (58th %'ile).   History of tobacco use    Hyperlipidemia    Hypertension    Hypothyroidism    a.) s/p partial thyroidectomy for nodule; pathology benign   Insomnia    Left knee pain, unspecified chronicity    Lipoma    Malignant neoplasm of upper-outer quadrant of right female breast (HCC) 03/03/2021   a.) stage 1A invasive mammary carcinoma (cT1b or T1c N0); ER (-), PR (+), HER2/neu (+); did not tolerate adjuvant chemotherapy (paclitacel  + trastuzumab) despite paclitaxel dose reduction; s/p adjuvant XRT; cinished maintenance trastuzumab 07/27/2022; no adjuvant endocrine therapy as no clear benefit   Migraine    Nephrolithiasis    On apixaban therapy    OSA (obstructive sleep apnea)    a.) non-compliant with nocturnal PAP therapy   Osteoarthritis    Osteopenia    Peptic ulcer disease    a.) H. pylori   Pericarditis 12/12/2022   a.) following A.fib ablation; resolved   Pneumonia    Port-A-Cath in place    RLS (restless legs syndrome)    a.) on ropinirole   Vitamin D deficiency     Past Surgical History:  Procedure Laterality Date   ABDOMINAL HYSTERECTOMY     ovaries left   ACHILLES TENDON SURGERY     2012,01/2017   ATRIAL FIBRILLATION ABLATION N/A 12/12/2022   Procedure: ATRIAL FIBRILLATION ABLATION;  Surgeon: Lanier Prude, MD;  Location: [facility];  Service: Cardiovascular;  Laterality: N/A;   BREAST BIOPSY Right 01/15/2021   Affirm biopsy-"X" clip-INVASIVE MAMMARY CARCINOMA   BREAST BIOPSY Right 01/15/2021   u/s bx-"venus" clip INVASIVE MAMMARY CARCINOMA   BREAST BIOPSY     BREAST BIOPSY Right 01/04/2023   u/s bx ribbon clip path pending   BREAST BIOPSY Right 01/04/2023   [place] RT BREAST BX W LOC DEV 1ST LESION IMG BX SPEC US GUIDE 01/04/2023 [facility]   BREAST LUMPECTOMY,RADIO FREQ LOCALIZER,AXILLARY SENTINEL LYMPH NODE BIOPSY Right 02/17/2021   Procedure: BREAST LUMPECTOMY,RADIO FREQ LOCALIZER,AXILLARY SENTINEL LYMPH NODE BIOPSY;  Surgeon: Campbell Lerner, MD;  Location: [facility];  Service: General;  Laterality: Right;   CARDIOVERSION N/A 09/07/2016   Procedure: CARDIOVERSION;  Surgeon: Antonieta Iba, MD;  Location: [facility];  Service: Cardiovascular;  Laterality: N/A;   CARDIOVERSION N/A 10/19/2016   Procedure: CARDIOVERSION;  Surgeon: Antonieta Iba, MD;  Location: [facility];  Service: Cardiovascular;  Laterality: N/A;   CARDIOVERSION N/A 12/14/2016   Procedure: CARDIOVERSION;   Surgeon: Antonieta Iba, MD;  Location: [facility];  Service: Cardiovascular;  Laterality: N/A;   COLONOSCOPY     COLONOSCOPY WITH PROPOFOL N/A 05/13/2020   Procedure: COLONOSCOPY WITH PROPOFOL;  Surgeon: Toledo, Boykin Nearing, MD;  Location: [facility];  Service: Gastroenterology;  Laterality: N/A;  ELIQUIS   CYSTOSCOPY WITH STENT PLACEMENT Bilateral 07/12/2021   Procedure: CYSTOSCOPY WITH STENT PLACEMENT;  Surgeon: Sondra Come, MD;  Location: [facility];  Service: Urology;  Laterality: Bilateral;   CYSTOSCOPY/URETEROSCOPY/HOLMIUM LASER/STENT PLACEMENT Bilateral 07/30/2021   Procedure: CYSTOSCOPY/URETEROSCOPY/HOLMIUM LASER/BILATERAL STENT REMOVAL;  Surgeon: Sondra Come, MD;  Location: [facility];  Service: Urology;  Laterality: Bilateral;   ESOPHAGOGASTRODUODENOSCOPY     ESOPHAGOGASTRODUODENOSCOPY (EGD) WITH PROPOFOL N/A 05/13/2020   Procedure: ESOPHAGOGASTRODUODENOSCOPY (EGD) WITH PROPOFOL;  Surgeon: Norma Fredrickson, Boykin Nearing, MD;  Location: [facility];  Service: Gastroenterology;  Laterality: N/A;   FOOT SURGERY Left    tendon   LUMBAR FUSION     PORTA CATH INSERTION N/A 03/23/2021   Procedure: PORTA CATH INSERTION;  Surgeon: Renford Dills, MD;  Location: [facility];  Service: Cardiovascular;  Laterality: N/A;   PORTACATH PLACEMENT N/A 03/23/2021   Procedure: ATTEMPTED INSERTION PORT-A-CATH;  Surgeon: Campbell Lerner, MD;  Location: [facility];  Service: General;  Laterality: N/A;   THYROIDECTOMY  1990   half thyroid lobectomy secondary to a benign nodule   TOTAL HIP ARTHROPLASTY Right 08/21/2018   Procedure: TOTAL HIP ARTHROPLASTY ANTERIOR APPROACH-RIGHT CELL SAVER REQUESTED;  Surgeon: Kennedy Bucker, MD;  Location: [facility];  Service: Orthopedics;  Laterality: Right;   TOTAL HIP ARTHROPLASTY Left 05/11/2023   Procedure: TOTAL HIP ARTHROPLASTY ANTERIOR APPROACH;  Surgeon: Reinaldo Berber, MD;  Location: [facility];  Service: Orthopedics;  Laterality: Left;   TOTAL KNEE  ARTHROPLASTY Left 09/11/2023   Procedure: ARTHROPLASTY, KNEE, TOTAL;  Surgeon: Reinaldo Berber, MD;  Location: [facility];  Service: Orthopedics;  Laterality: Left;    MEDICATIONS:  Prior to Admission medications   Medication Sig Start Date End Date Taking? Authorizing Provider  acetaminophen (TYLENOL) 500 MG tablet Take 2 tablets (1,000 mg total) by mouth every 8 (eight) hours. 09/12/23   Evon Slack, PA-C  ALPRAZolam Prudy Feeler) 0.25 MG tablet TAKE 1 TABLET BY MOUTH TWICE A DAY AS NEEDED FOR ANXIETY 08/08/23   Allegra Grana, FNP  bisoprolol (ZEBETA) 10 MG tablet Take 1 tablet (10 mg total) by mouth 2 (two) times daily. 02/07/23   Antonieta Iba, MD  busPIRone (BUSPAR) 5 MG tablet TAKE 1 TABLET BY MOUTH THREE TIMES A DAY AS NEEDED 09/11/23   Allegra Grana, FNP  Calcium Carbonate (CALCIUM 500 PO) Take 1 tablet by mouth daily.    [provider]  Cholecalciferol (VITAMIN D3) 5000 units CAPS Take 5,000 Units by mouth daily.    [provider]  docusate sodium (COLACE) 100 MG capsule Take 1 capsule (100 mg total) by mouth 2 (two) times daily. 09/12/23   Amador Cunas C, PA-C  ELIQUIS 5 MG TABS tablet TAKE 1 TABLET BY MOUTH TWICE A DAY 08/21/23   Antonieta Iba, MD  escitalopram (LEXAPRO) 10 MG tablet Take 1 tablet (10 mg total) by mouth daily. 07/14/23   Allegra Grana, FNP  furosemide (LASIX) 20 MG tablet Take 1 tablet (20 mg total) by mouth daily. May take additional 20mg  PO prn for leg swelling 03/14/23   Allegra Grana, FNP  losartan-hydrochlorothiazide (HYZAAR) 50-12.5 MG tablet Take 0.5 tablets by mouth daily as needed (for BP 140/80). 07/14/23   Allegra Grana, FNP  Multiple Vitamin (MULTIVITAMIN) tablet Take 1 tablet by mouth daily.    [provider]  ondansetron (ZOFRAN) 4 MG tablet Take 1 tablet (4 mg total) by mouth every 6 (six) hours as needed for nausea. 09/12/23   Evon Slack, PA-C  oxyCODONE (ROXICODONE) 5 MG immediate release tablet Take  0.5-1 tablets (2.5-5 mg total) by mouth every 8 (eight) hours as needed. 09/12/23 09/11/24  Evon Slack, PA-C  potassium chloride SA (KLOR-CON M) 20 MEQ tablet Take 1 tablet (20 mEq total) by mouth daily. Patient taking differently: Take 20 mEq by mouth every morning. And if she takes additional Lasix she will take another K+ 11/08/22   Antonieta Iba, MD  ranolazine (RANEXA) 500 MG 12 hr tablet TAKE  1 TABLET BY MOUTH TWICE A DAY 03/14/23   Antonieta Iba, MD  rOPINIRole (REQUIP) 2 MG tablet Take 2 mg by mouth at bedtime. 08/21/23   [provider]  rosuvastatin (CRESTOR) 40 MG tablet TAKE 1 TABLET BY MOUTH EVERY DAY 03/10/23   Allegra Grana, FNP  traMADol (ULTRAM) 50 MG tablet Take 1 tablet (50 mg total) by mouth every 6 (six) hours as needed for moderate pain (pain score 4-6). 09/12/23   Evon Slack, PA-C  prochlorperazine (COMPAZINE) 10 MG tablet Take 1 tablet (10 mg total) by mouth every 6 (six) hours as needed (Nausea or vomiting). 03/19/21 03/02/22  [name], MD    Physical Exam   Triage Vital Signs: ED Triage Vitals  Encounter Vitals Group     BP 10/15/23 2306 97/75     Systolic BP Percentile --      Diastolic BP Percentile --      Pulse Rate 10/15/23 2306 64     Resp 10/15/23 2306 18     Temp 10/15/23 2306 99 F (37.2 C)     Temp Source 10/15/23 2306 Oral     SpO2 10/15/23 2306 98 %     Weight 10/15/23 2304 238 lb (108 kg)     Height 10/15/23 2304 5\' 5"  (1.651 m)     Head Circumference --      Peak Flow --      Pain Score 10/15/23 2304 10     Pain Loc --      Pain Education --      Exclude from Growth Chart --     Most recent vital signs: Vitals:   10/16/23 0225 10/16/23 0727  BP: (!) 110/49 (!) 112/50  Pulse: 71 70  Resp: 17 16  Temp:  98 F (36.7 C)  SpO2: 97% 98%    CONSTITUTIONAL: Alert, responds appropriately to questions. Well-appearing; well-nourished, elderly, pleasant, nontoxic in appearance HEAD: Normocephalic, atraumatic EYES:  Conjunctivae clear, pupils appear equal, sclera nonicteric ENT: normal nose; moist mucous membranes NECK: Supple, normal ROM CARD: RRR; S1 and S2 appreciated RESP: Normal chest excursion without splinting or tachypnea; breath sounds clear and equal bilaterally; no wheezes, no rhonchi, no rales, no hypoxia or respiratory distress, speaking full sentences ABD/GI: Non-distended; soft, non-tender, no rebound, no guarding, no peritoneal signs BACK: The back appears normal, tender to palpation over the lower lumbar spine without step-off or deformity or overlying skin changes EXT: Patient is tender to palpation diffusely over the left hip without soft tissue swelling, ecchymosis, increased redness or warmth.  I am unable to range her hip at all due to significant pain and this also limits range of motion in the knee although she is not tender over the left knee and there is no overlying soft tissue swelling, joint effusion, increased redness or warmth to the knee.  There is no calf tenderness or calf swelling.  She has 2+ DP pulses bilaterally and normal movement of the ankle, toes.  Compartments in the left leg are soft.  Normal capillary refill.  Normal sensation. SKIN: Normal color for age and race; warm; no rash on exposed skin NEURO: Moves all extremities equally, normal speech, normal sensation, no saddle anesthesia, no hyperreflexia, gait testing deferred, difficult to test for strength in her left leg due to significant pain in the hip PSYCH: The patient's mood and manner are appropriate.   ED Results / Procedures / Treatments   LABS: (all labs ordered are listed, but only  abnormal results are displayed) Labs Reviewed  CBC WITH DIFFERENTIAL/PLATELET - Abnormal; Notable for the following components:      Result Value   RBC 3.29 (*)    Hemoglobin 10.1 (*)    HCT 31.1 (*)    All other components within normal limits  BASIC METABOLIC PANEL WITH GFR - Abnormal; Notable for the following  components:   Calcium 8.2 (*)    All other components within normal limits  SEDIMENTATION RATE - Abnormal; Notable for the following components:   Sed Rate 80 (*)    All other components within normal limits  C-REACTIVE PROTEIN - Abnormal; Notable for the following components:   CRP 17.0 (*)    All other components within normal limits     EKG:  EKG Interpretation Date/Time:    Ventricular Rate:    PR Interval:    QRS Duration:    QT Interval:    QTC Calculation:   R Axis:      Text Interpretation:           RADIOLOGY: My personal review and interpretation of imaging: MRI shows L3 radiculitis.  X-ray of the left hip shows no fracture.  I have personally reviewed all radiology reports.   MR LUMBAR SPINE WO CONTRAST Result Date: 10/16/2023 CLINICAL DATA:  73 year old female with low back pain, left side pain radiating to the hip. EXAM: MRI LUMBAR SPINE WITHOUT CONTRAST TECHNIQUE: Multiplanar, multisequence MR imaging of the lumbar spine was performed. No intravenous contrast was administered. COMPARISON:  Lumbar MRI 02/23/2018. CT Abdomen and Pelvis 07/12/2021. FINDINGS: Segmentation: Normal segmentation on the CT comparison, same numbering system as on the 2019 MRI. Alignment: Generally stable lumbar lordosis since 2019. mild chronic retrolisthesis at L1-L2 and L2-L3 is stable. Similar new retrolisthesis at L3-L4. Subtle chronic anterolisthesis at L4-L5 is stable. Mild dextroconvex chronic lumbar scoliosis. Vertebrae: Normal background bone marrow signal. Stable vertebral height since 2019. Chronic degenerative endplate changes with a mix of both acute and chronic degenerative marrow signal changes including mild marrow edema inferiorly at the L1 endplate, at the left L4 superior endplate. Occasional benign vertebral hemangiomas (no follow-up imaging recommended). Intact visible sacrum and SI joints. Chronic postoperative changes as detailed below. Conus medullaris and cauda equina:  Conus extends to the L1 level. No lower spinal cord or conus signal abnormality. Paraspinal and other soft tissues: Chronic benign renal cysts (no follow-up imaging recommended). Negative visible other abdominal viscera. Chronic mild postoperative changes to the posterior paraspinal soft tissues. Disc levels: Visible lower thoracic levels through T12-L1:  Negative. L1-L2: Progressed disc space loss since 2019. Circumferential disc osteophyte complex asymmetric to the right. Mild facet and ligament flavum hypertrophy. Epidural lipomatosis. New mild spinal stenosis. Mild left and moderate right L1 foraminal stenosis have increased. L2-L3: Progressed disc space loss with circumferential disc osteophyte complex asymmetric to the right. Mild facet and ligament flavum hypertrophy. Moderate new spinal stenosis (series 8, image 13). Moderate right lateral recess stenosis is new (right L3 nerve level). Moderate to severe right L2 foraminal stenosis has increased. Stable mild left foraminal stenosis. L3-L4: Posterior decompression, laminectomy in the midline since 2019. Severe residual facet and ligament flavum hypertrophy greater on the left. Circumferential disc osteophyte complex asymmetric to the left. Mild residual spinal and lateral recess stenosis. Severe left L3 foraminal stenosis is largely new since 2019 (series 5, image 14). A small posterior synovial cyst just below the disc space does not meaningfully contribute to stenosis (series 6, image 10). Right L3 foraminal  stenosis is mild. L4-L5: Chronic grade 1 anterolisthesis. Chronic midline posterior decompression. Stable moderate facet hypertrophy greater on the left. No stenosis. L5-S1: Disc space loss with similar chronic circumferential disc osteophyte complex asymmetric to the right. Mild facet and ligament flavum hypertrophy. No spinal or lateral recess stenosis. Mild to moderate bilateral L5 foraminal stenosis is stable. IMPRESSION: 1. Stable chronic lower  lumbar spine degeneration at L4-L5 and L5-S1, the former chronically decompressed posteriorly. 2. Midline posterior L3-L4 posterior decompression since 2019, but severe residual facet and posterior element hypertrophy greater on the left. Severe new left foraminal stenosis, query left L3 radiculitis. Mild residual spinal and lateral recess stenosis. 3. Progressed degeneration and spinal stenosis also at L1-L2 and L2-L3 since 2019, moderate at the latter. Increased right greater than left neural foraminal stenosis at those levels. Electronically Signed   By: Odessa Fleming M.D.   On: 10/16/2023 05:33   DG Hip Unilat W or Wo Pelvis 2-3 Views Left Result Date: 10/15/2023 CLINICAL DATA:  pain EXAM: DG HIP (WITH OR WITHOUT PELVIS) 2-3V LEFT COMPARISON:  MRI left hip 08/08/2023 trauma x-ray left hip 05/11/2023 FINDINGS: Total bilateral hip arthroplasty. No radiographic findings to suggest surgical hardware complication. There is no evidence of hip fracture or dislocation. No acute displaced fracture or diastasis of the bones of the pelvis. There is no evidence of arthropathy or other focal bone abnormality. IMPRESSION: Negative for acute traumatic injury. Total bilateral hip arthroplasty. Electronically Signed   By: Tish Frederickson M.D.   On: 10/15/2023 23:34     PROCEDURES:  Critical Care performed: No      Procedures    IMPRESSION / MDM / ASSESSMENT AND PLAN / ED COURSE  I reviewed the triage vital signs and the nursing notes.    Patient here with severe left hip pain.  The patient is on the cardiac monitor to evaluate for evidence of arrhythmia and/or significant heart rate changes.   DIFFERENTIAL DIAGNOSIS (includes but not limited to):   Arthritis, labral tear, less likely fracture, dislocation, septic arthritis.  Differential also includes lumbar radiculopathy.  Doubt cauda equina, epidural abscess or hematoma, discitis or osteomyelitis.   Patient's presentation is most consistent with acute  presentation with potential threat to life or bodily function.   PLAN: Imaging of the hip obtained from triage and reviewed/interpreted by myself and the radiologist and showed no acute abnormality.  Will provide with pain medication.  She does have initial temperature of 99 but denies any infectious symptoms.  Will check labs including white blood cell count, inflammatory markers and recheck her temperature.   MEDICATIONS GIVEN IN ED: Medications  dexamethasone (DECADRON) injection 10 mg (has no administration in time range)  morphine (PF) 4 MG/ML injection 4 mg (4 mg Intravenous Given 10/16/23 0110)  ondansetron (ZOFRAN) injection 4 mg (4 mg Intravenous Given 10/16/23 0108)  morphine (PF) 4 MG/ML injection 4 mg (4 mg Intravenous Given 10/16/23 0248)  ketorolac (TORADOL) 30 MG/ML injection 15 mg (15 mg Intravenous Given 10/16/23 0246)  fentaNYL (SUBLIMAZE) injection 50 mcg (50 mcg Intravenous Given 10/16/23 0519)  sodium chloride 0.9 % bolus 500 mL (500 mLs Intravenous New Bag/Given 10/16/23 0519)     ED COURSE: Repeat temperature normal.  No leukocytosis.  ESR minimally elevated.  CRP pending.  Patient still having significant pain despite morphine, Toradol, fentanyl.  Will admit for pain control, physical therapy and nonemergent orthopedic consult.  Will obtain MRI of the lumbar spine, hip.    MRI of the lumbar  spine reviewed and interpreted by myself and the radiologist and shows L3 radiculitis.  This could be contributing to her symptoms.  Will give a dose of Decadron here.  MRI of the hip pending.   CONSULTS:  Consulted and discussed patient's case with hospitalist, Dr. Arville Care.  I have recommended admission and consulting physician agrees and will place admission orders.  Patient (and family if present) agree with this plan.   I reviewed all nursing notes, vitals, pertinent previous records.  All labs, EKGs, imaging ordered have been independently reviewed and interpreted by  myself.    OUTSIDE RECORDS REVIEWED: Reviewed previous orthopedic notes.       FINAL CLINICAL IMPRESSION(S) / ED DIAGNOSES   Final diagnoses:  Left hip pain  Intractable pain  Unable to ambulate  Lumbar radiculitis     Rx / DC Orders   ED Discharge Orders     None        Note:  This document was prepared using Dragon voice recognition software and may include unintentional dictation errors.   Kenia Teagarden, Layla Maw, DO 10/16/23 [phone]

## 2023-10-17 ENCOUNTER — Observation Stay: Admitting: Anesthesiology

## 2023-10-17 ENCOUNTER — Observation Stay

## 2023-10-17 ENCOUNTER — Encounter
Admission: EM | Disposition: A | Discharge status: Home Health | Payer: Self-pay | Source: Home / Self Care | Attending: Internal Medicine

## 2023-10-17 ENCOUNTER — Other Ambulatory Visit: Payer: Self-pay

## 2023-10-17 ENCOUNTER — Encounter: Payer: Self-pay | Admitting: Internal Medicine

## 2023-10-17 DIAGNOSIS — I4891 Unspecified atrial fibrillation: Secondary | ICD-10-CM | POA: Diagnosis not present

## 2023-10-17 DIAGNOSIS — G4700 Insomnia, unspecified: Secondary | ICD-10-CM | POA: Insufficient documentation

## 2023-10-17 DIAGNOSIS — Z96642 Presence of left artificial hip joint: Secondary | ICD-10-CM | POA: Diagnosis not present

## 2023-10-17 DIAGNOSIS — E785 Hyperlipidemia, unspecified: Secondary | ICD-10-CM

## 2023-10-17 DIAGNOSIS — M5416 Radiculopathy, lumbar region: Secondary | ICD-10-CM

## 2023-10-17 DIAGNOSIS — I5032 Chronic diastolic (congestive) heart failure: Secondary | ICD-10-CM

## 2023-10-17 DIAGNOSIS — T8452XA Infection and inflammatory reaction due to internal left hip prosthesis, initial encounter: Secondary | ICD-10-CM | POA: Diagnosis not present

## 2023-10-17 DIAGNOSIS — M25552 Pain in left hip: Secondary | ICD-10-CM | POA: Diagnosis not present

## 2023-10-17 DIAGNOSIS — R262 Difficulty in walking, not elsewhere classified: Secondary | ICD-10-CM | POA: Diagnosis not present

## 2023-10-17 DIAGNOSIS — T8149XA Infection following a procedure, other surgical site, initial encounter: Secondary | ICD-10-CM | POA: Diagnosis not present

## 2023-10-17 DIAGNOSIS — I10 Essential (primary) hypertension: Secondary | ICD-10-CM

## 2023-10-17 DIAGNOSIS — I4892 Unspecified atrial flutter: Secondary | ICD-10-CM

## 2023-10-17 DIAGNOSIS — R52 Pain, unspecified: Secondary | ICD-10-CM

## 2023-10-17 DIAGNOSIS — G2581 Restless legs syndrome: Secondary | ICD-10-CM

## 2023-10-17 HISTORY — PX: EXCISIONAL TOTAL HIP ARTHROPLASTY WITH ANTIBIOTIC SPACERS: SHX5826

## 2023-10-17 LAB — BODY FLUID CELL COUNT WITH DIFFERENTIAL
Eos, Fluid: 0 %
Lymphs, Fluid: 2 %
Monocyte-Macrophage-Serous Fluid: 5 %
Neutrophil Count, Fluid: 93 %
Total Nucleated Cell Count, Fluid: 14749 uL

## 2023-10-17 LAB — TYPE AND SCREEN
ABO/RH(D): A POS
Antibody Screen: NEGATIVE

## 2023-10-17 SURGERY — EXCISIONAL TOTAL HIP ARTHROPLASTY WITH ANTIBIOTIC SPACERS
Anesthesia: General | Site: Hip | Laterality: Left

## 2023-10-17 MED ORDER — DEXAMETHASONE SODIUM PHOSPHATE 10 MG/ML IJ SOLN
INTRAMUSCULAR | Status: AC
  Filled 2023-10-17: qty 1

## 2023-10-17 MED ORDER — SODIUM CHLORIDE 0.9 % IV SOLN: INTRAVENOUS | Status: DC

## 2023-10-17 MED ORDER — TRANEXAMIC ACID 1000 MG/10ML IV SOLN
INTRAVENOUS | Status: DC | PRN
  Administered 2023-10-17 (×2): 1000 mg via INTRAVENOUS

## 2023-10-17 MED ORDER — FENTANYL CITRATE (PF) 100 MCG/2ML IJ SOLN
INTRAMUSCULAR | Status: DC | PRN
  Administered 2023-10-17: 25 ug via INTRAVENOUS
  Administered 2023-10-17: 50 ug via INTRAVENOUS
  Administered 2023-10-17: 25 ug via INTRAVENOUS

## 2023-10-17 MED ORDER — METOCLOPRAMIDE HCL 5 MG PO TABS: 5.0000 mg | ORAL_TABLET | Freq: Three times a day (TID) | ORAL | Status: DC | PRN

## 2023-10-17 MED ORDER — PROPOFOL 10 MG/ML IV BOLUS
INTRAVENOUS | Status: AC
Start: 2023-10-17 — End: ?
  Filled 2023-10-17: qty 20

## 2023-10-17 MED ORDER — METOCLOPRAMIDE HCL 5 MG/ML IJ SOLN: 5.0000 mg | Freq: Three times a day (TID) | INTRAMUSCULAR | Status: DC | PRN

## 2023-10-17 MED ORDER — SODIUM CHLORIDE 0.9 % IR SOLN
Status: DC | PRN
  Administered 2023-10-17: 3000 mL
  Administered 2023-10-17: 1000 mL
  Administered 2023-10-17: 100 mL

## 2023-10-17 MED ORDER — HYDROCHLOROTHIAZIDE 12.5 MG PO TABS
6.2500 mg | ORAL_TABLET | Freq: Every day | ORAL | Status: DC
  Administered 2023-10-18 – 2023-10-22 (×5): 6.25 mg via ORAL
  Filled 2023-10-17 (×5): qty 1

## 2023-10-17 MED ORDER — LACTATED RINGERS IV SOLN: INTRAVENOUS | Status: DC | PRN

## 2023-10-17 MED ORDER — LOSARTAN POTASSIUM-HCTZ 50-12.5 MG PO TABS: 0.5000 | ORAL_TABLET | Freq: Every day | ORAL | Status: DC

## 2023-10-17 MED ORDER — ESMOLOL HCL 100 MG/10ML IV SOLN
INTRAVENOUS | Status: DC | PRN
  Administered 2023-10-17 (×2): 20 mg via INTRAVENOUS

## 2023-10-17 MED ORDER — OXYCODONE HCL 5 MG PO TABS
5.0000 mg | ORAL_TABLET | ORAL | Status: DC | PRN
  Administered 2023-10-17: 5 mg via ORAL

## 2023-10-17 MED ORDER — ROCURONIUM BROMIDE 10 MG/ML (PF) SYRINGE
PREFILLED_SYRINGE | INTRAVENOUS | Status: AC
  Filled 2023-10-17: qty 10

## 2023-10-17 MED ORDER — MIDAZOLAM HCL 2 MG/2ML IJ SOLN
INTRAMUSCULAR | Status: AC
  Filled 2023-10-17: qty 2

## 2023-10-17 MED ORDER — VANCOMYCIN HCL 1 G IV SOLR
INTRAVENOUS | Status: DC | PRN
  Administered 2023-10-17: 1000 mg

## 2023-10-17 MED ORDER — LIDOCAINE HCL (PF) 2 % IJ SOLN
INTRAMUSCULAR | Status: AC
  Filled 2023-10-17: qty 5

## 2023-10-17 MED ORDER — VANCOMYCIN HCL IN DEXTROSE 1-5 GM/200ML-% IV SOLN
1000.0000 mg | Freq: Once | INTRAVENOUS | Status: AC
  Administered 2023-10-17: 1000 mg via INTRAVENOUS

## 2023-10-17 MED ORDER — LOSARTAN POTASSIUM 25 MG PO TABS
25.0000 mg | ORAL_TABLET | Freq: Every day | ORAL | Status: DC
  Administered 2023-10-18 – 2023-10-22 (×2): 25 mg via ORAL
  Filled 2023-10-17 (×5): qty 1

## 2023-10-17 MED ORDER — TOBRAMYCIN SULFATE 1.2 G IJ SOLR
INTRAMUSCULAR | Status: AC
  Filled 2023-10-17: qty 1.2

## 2023-10-17 MED ORDER — CEFAZOLIN SODIUM-DEXTROSE 2-4 GM/100ML-% IV SOLN
INTRAVENOUS | Status: AC
  Filled 2023-10-17: qty 100

## 2023-10-17 MED ORDER — SODIUM CHLORIDE (PF) 0.9 % IJ SOLN
INTRAMUSCULAR | Status: DC | PRN
  Administered 2023-10-17: 15 mL

## 2023-10-17 MED ORDER — 0.9 % SODIUM CHLORIDE (POUR BTL) OPTIME
TOPICAL | Status: DC | PRN
  Administered 2023-10-17: 500 mL

## 2023-10-17 MED ORDER — VANCOMYCIN HCL IN DEXTROSE 1-5 GM/200ML-% IV SOLN
INTRAVENOUS | Status: AC
  Filled 2023-10-17: qty 200

## 2023-10-17 MED ORDER — FENTANYL CITRATE (PF) 100 MCG/2ML IJ SOLN
INTRAMUSCULAR | Status: AC
  Filled 2023-10-17: qty 2

## 2023-10-17 MED ORDER — ONDANSETRON HCL 4 MG/2ML IJ SOLN
INTRAMUSCULAR | Status: DC | PRN
  Administered 2023-10-17: 4 mg via INTRAVENOUS

## 2023-10-17 MED ORDER — LIDOCAINE 1 % OPTIME INJ - NO CHARGE
10.0000 mL | Freq: Once | INTRAMUSCULAR | Status: AC
  Administered 2023-10-17: 10 mL via SUBCUTANEOUS

## 2023-10-17 MED ORDER — HYDRALAZINE HCL 20 MG/ML IJ SOLN
INTRAMUSCULAR | Status: DC | PRN
Start: 2023-10-17 — End: 2023-10-17
  Administered 2023-10-17: 5 mg via INTRAVENOUS

## 2023-10-17 MED ORDER — SUGAMMADEX SODIUM 200 MG/2ML IV SOLN
INTRAVENOUS | Status: AC
  Filled 2023-10-17: qty 4

## 2023-10-17 MED ORDER — DOCUSATE SODIUM 100 MG PO CAPS
100.0000 mg | ORAL_CAPSULE | Freq: Two times a day (BID) | ORAL | Status: DC
  Administered 2023-10-17 – 2023-10-22 (×10): 100 mg via ORAL
  Filled 2023-10-17 (×10): qty 1

## 2023-10-17 MED ORDER — ACETAMINOPHEN 500 MG PO TABS
1000.0000 mg | ORAL_TABLET | Freq: Three times a day (TID) | ORAL | Status: AC
  Administered 2023-10-17: 1000 mg via ORAL
  Filled 2023-10-17: qty 2

## 2023-10-17 MED ORDER — DIPHENHYDRAMINE HCL 25 MG PO CAPS
25.0000 mg | ORAL_CAPSULE | Freq: Four times a day (QID) | ORAL | Status: DC | PRN
  Administered 2023-10-17: 25 mg via ORAL

## 2023-10-17 MED ORDER — PHENYLEPHRINE HCL-NACL 20-0.9 MG/250ML-% IV SOLN
INTRAVENOUS | Status: AC
  Filled 2023-10-17: qty 250

## 2023-10-17 MED ORDER — HYDROMORPHONE HCL 1 MG/ML IJ SOLN
INTRAMUSCULAR | Status: AC
  Filled 2023-10-17: qty 0.5

## 2023-10-17 MED ORDER — DIPHENHYDRAMINE HCL 25 MG PO CAPS
ORAL_CAPSULE | ORAL | Status: AC
  Filled 2023-10-17: qty 1

## 2023-10-17 MED ORDER — OXYCODONE HCL 5 MG PO TABS
ORAL_TABLET | ORAL | Status: AC
  Filled 2023-10-17: qty 1

## 2023-10-17 MED ORDER — TRANEXAMIC ACID 1000 MG/10ML IV SOLN: INTRAVENOUS | Status: DC | PRN

## 2023-10-17 MED ORDER — FENTANYL CITRATE (PF) 100 MCG/2ML IJ SOLN
INTRAMUSCULAR | Status: AC
Start: 2023-10-17 — End: ?
  Filled 2023-10-17: qty 2

## 2023-10-17 MED ORDER — SURGIPHOR WOUND IRRIGATION SYSTEM - OPTIME: TOPICAL | Status: DC | PRN

## 2023-10-17 MED ORDER — LIDOCAINE HCL (CARDIAC) PF 100 MG/5ML IV SOSY
PREFILLED_SYRINGE | INTRAVENOUS | Status: DC | PRN
Start: 2023-10-17 — End: 2023-10-17
  Administered 2023-10-17: 60 mg via INTRAVENOUS

## 2023-10-17 MED ORDER — FENTANYL CITRATE (PF) 100 MCG/2ML IJ SOLN
25.0000 ug | INTRAMUSCULAR | Status: DC | PRN
Start: 2023-10-17 — End: 2023-10-17
  Administered 2023-10-17 (×4): 25 ug via INTRAVENOUS

## 2023-10-17 MED ORDER — SUGAMMADEX SODIUM 200 MG/2ML IV SOLN
INTRAVENOUS | Status: DC | PRN
  Administered 2023-10-17: 200 mg via INTRAVENOUS

## 2023-10-17 MED ORDER — HYDROMORPHONE HCL 1 MG/ML IJ SOLN
INTRAMUSCULAR | Status: AC
  Filled 2023-10-17: qty 1

## 2023-10-17 MED ORDER — PROPOFOL 10 MG/ML IV BOLUS
INTRAVENOUS | Status: DC | PRN
  Administered 2023-10-17: 200 mg via INTRAVENOUS

## 2023-10-17 MED ORDER — PHENOL 1.4 % MT LIQD: 1.0000 | OROMUCOSAL | Status: DC | PRN

## 2023-10-17 MED ORDER — GLYCOPYRROLATE 0.2 MG/ML IJ SOLN
INTRAMUSCULAR | Status: DC | PRN
Start: 2023-10-17 — End: 2023-10-17
  Administered 2023-10-17: .2 mg via INTRAVENOUS

## 2023-10-17 MED ORDER — MENTHOL 3 MG MT LOZG: 1.0000 | LOZENGE | OROMUCOSAL | Status: DC | PRN

## 2023-10-17 MED ORDER — TRANEXAMIC ACID-NACL 1000-0.7 MG/100ML-% IV SOLN
INTRAVENOUS | Status: AC
  Filled 2023-10-17: qty 100

## 2023-10-17 MED ORDER — HYDROMORPHONE HCL 1 MG/ML IJ SOLN
0.5000 mg | INTRAMUSCULAR | Status: DC | PRN
  Administered 2023-10-17: 0.5 mg via INTRAVENOUS

## 2023-10-17 MED ORDER — SUCCINYLCHOLINE CHLORIDE 200 MG/10ML IV SOSY
PREFILLED_SYRINGE | INTRAVENOUS | Status: DC | PRN
Start: 2023-10-17 — End: 2023-10-17
  Administered 2023-10-17: 100 mg via INTRAVENOUS

## 2023-10-17 MED ORDER — FENTANYL CITRATE (PF) 100 MCG/2ML IJ SOLN
INTRAMUSCULAR | Status: AC | PRN
Start: 2023-10-17 — End: 2023-10-17
  Administered 2023-10-17: 50 ug via INTRAVENOUS

## 2023-10-17 MED ORDER — ROCURONIUM BROMIDE 100 MG/10ML IV SOLN
INTRAVENOUS | Status: DC | PRN
Start: 2023-10-17 — End: 2023-10-17
  Administered 2023-10-17 (×2): 10 mg via INTRAVENOUS
  Administered 2023-10-17 (×2): 30 mg via INTRAVENOUS

## 2023-10-17 MED ORDER — ONDANSETRON HCL 4 MG/2ML IJ SOLN
INTRAMUSCULAR | Status: AC
  Filled 2023-10-17: qty 2

## 2023-10-17 MED ORDER — TOBRAMYCIN SULFATE 1.2 G IJ SOLR
INTRAMUSCULAR | Status: DC | PRN
  Administered 2023-10-17: 1.2 g

## 2023-10-17 MED ORDER — PANTOPRAZOLE SODIUM 40 MG PO TBEC
40.0000 mg | DELAYED_RELEASE_TABLET | Freq: Every day | ORAL | Status: DC
  Administered 2023-10-17 – 2023-10-22 (×6): 40 mg via ORAL
  Filled 2023-10-17 (×6): qty 1

## 2023-10-17 MED ORDER — HYDRALAZINE HCL 20 MG/ML IJ SOLN
INTRAMUSCULAR | Status: AC
  Filled 2023-10-17: qty 1

## 2023-10-17 MED ORDER — CEFAZOLIN SODIUM-DEXTROSE 2-4 GM/100ML-% IV SOLN
2.0000 g | Freq: Once | INTRAVENOUS | Status: AC
  Administered 2023-10-17: 2 g via INTRAVENOUS

## 2023-10-17 MED ORDER — SODIUM CHLORIDE 0.9 % IV SOLN: INTRAVENOUS | Status: AC

## 2023-10-17 MED ORDER — HYDROMORPHONE HCL 1 MG/ML IJ SOLN
INTRAMUSCULAR | Status: DC | PRN
  Administered 2023-10-17 (×2): .5 mg via INTRAVENOUS

## 2023-10-17 MED ORDER — IRRISEPT - 450ML BOTTLE WITH 0.05% CHG IN STERILE WATER, USP 99.95% OPTIME
TOPICAL | Status: DC | PRN
  Administered 2023-10-17: 450 mL

## 2023-10-17 MED ORDER — CEFAZOLIN SODIUM-DEXTROSE 2-4 GM/100ML-% IV SOLN
2.0000 g | Freq: Three times a day (TID) | INTRAVENOUS | Status: DC
  Administered 2023-10-17 – 2023-10-18 (×2): 2 g via INTRAVENOUS
  Filled 2023-10-17 (×2): qty 100

## 2023-10-17 MED ORDER — GLYCOPYRROLATE 0.2 MG/ML IJ SOLN
INTRAMUSCULAR | Status: AC
  Filled 2023-10-17: qty 2

## 2023-10-17 MED ORDER — CHLORHEXIDINE GLUCONATE 0.12 % MT SOLN
OROMUCOSAL | Status: AC
Start: 2023-10-17 — End: ?
  Filled 2023-10-17: qty 15

## 2023-10-17 MED ORDER — DEXAMETHASONE SODIUM PHOSPHATE 10 MG/ML IJ SOLN
INTRAMUSCULAR | Status: DC | PRN
  Administered 2023-10-17: 5 mg via INTRAVENOUS

## 2023-10-17 MED ORDER — MIDAZOLAM HCL 5 MG/5ML IJ SOLN
INTRAMUSCULAR | Status: AC | PRN
  Administered 2023-10-17 (×2): 1 mg via INTRAVENOUS

## 2023-10-17 SURGICAL SUPPLY — 78 items
BLADE SAGITTAL AGGR TOOTH XLG (BLADE) ×1 IMPLANT
BNDG COHESIVE 6X5 TAN ST LF (GAUZE/BANDAGES/DRESSINGS) ×1 IMPLANT
BRUSH SCRUB EZ PLAIN DRY (MISCELLANEOUS) ×1 IMPLANT
CANISTER WOUND CARE 500ML ATS (WOUND CARE) IMPLANT
CHLORAPREP W/TINT 26 (MISCELLANEOUS) ×1 IMPLANT
CNTNR URN SCR LID CUP LEK RST (MISCELLANEOUS) IMPLANT
DERMABOND ADVANCED .7 DNX12 (GAUZE/BANDAGES/DRESSINGS) ×1 IMPLANT
DRAPE C-ARM XRAY 36X54 (DRAPES) ×1 IMPLANT
DRAPE SHEET LG 3/4 BI-LAMINATE (DRAPES) ×3 IMPLANT
DRAPE TABLE BACK 80X90 (DRAPES) ×1 IMPLANT
DRSG MEPILEX SACRM 8.7X9.8 (GAUZE/BANDAGES/DRESSINGS) ×1 IMPLANT
DRSG OPSITE POSTOP 4X8 (GAUZE/BANDAGES/DRESSINGS) ×1 IMPLANT
ELECT BLADE 4.0 EZ CLEAN MEGAD (MISCELLANEOUS) ×1 IMPLANT
ELECT REM PT RETURN 9FT ADLT (ELECTROSURGICAL) ×1 IMPLANT
ELECTRODE BLDE 4.0 EZ CLN MEGD (MISCELLANEOUS) ×1 IMPLANT
ELECTRODE REM PT RTRN 9FT ADLT (ELECTROSURGICAL) ×1 IMPLANT
EVACUATOR 1/8 PVC DRAIN (DRAIN) IMPLANT
GLOVE BIO SURGEON STRL SZ8 (GLOVE) ×1 IMPLANT
GLOVE BIOGEL PI IND STRL 8 (GLOVE) ×1 IMPLANT
GLOVE PI ORTHO PRO STRL 7.5 (GLOVE) ×2 IMPLANT
GLOVE PI ORTHO PRO STRL SZ8 (GLOVE) ×2 IMPLANT
GLOVE SURG SYN 7.5 E (GLOVE) ×1 IMPLANT
GLOVE SURG SYN 7.5 PF PI (GLOVE) ×1 IMPLANT
GOWN SRG XL LVL 3 NONREINFORCE (GOWNS) ×1 IMPLANT
GOWN STRL REUS W/ TWL LRG LVL3 (GOWN DISPOSABLE) ×1 IMPLANT
GOWN STRL REUS W/ TWL XL LVL3 (GOWN DISPOSABLE) ×1 IMPLANT
HANDLE YANKAUER SUCT OPEN TIP (MISCELLANEOUS) IMPLANT
HANDPIECE VERSAJET DEBRIDEMENT (MISCELLANEOUS) IMPLANT
HEAD FEM BIOLOX DELTA UN 36 (Head) IMPLANT
HOOD PEEL AWAY T7 (MISCELLANEOUS) ×2 IMPLANT
INSERT 0 DEGREE 36 (Miscellaneous) IMPLANT
IV NS 1000ML BAXH (IV SOLUTION) IMPLANT
IV NS 100ML SINGLE PACK (IV SOLUTION) ×1 IMPLANT
JET LAVAGE IRRISEPT WOUND (IRRIGATION / IRRIGATOR) ×1 IMPLANT
KIT PATIENT CARE HANA TABLE (KITS) ×1 IMPLANT
KIT PREVENA INCISION MGT 13 (CANNISTER) IMPLANT
KIT STIMULAN RAPID CURE 5CC (Orthopedic Implant) IMPLANT
KIT TURNOVER CYSTO (KITS) ×1 IMPLANT
LAVAGE JET IRRISEPT WOUND (IRRIGATION / IRRIGATOR) IMPLANT
LIGHT WAVEGUIDE WIDE FLAT (MISCELLANEOUS) ×1 IMPLANT
MANIFOLD NEPTUNE II (INSTRUMENTS) ×1 IMPLANT
MARKER SKIN DUAL TIP RULER LAB (MISCELLANEOUS) ×1 IMPLANT
MAT ABSORB FLUID 56X50 GRAY (MISCELLANEOUS) ×1 IMPLANT
NDL SPNL 20GX3.5 QUINCKE YW (NEEDLE) ×1 IMPLANT
NEEDLE SPNL 20GX3.5 QUINCKE YW (NEEDLE) IMPLANT
NS IRRIG 500ML POUR BTL (IV SOLUTION) ×1 IMPLANT
PACK HIP COMPR (MISCELLANEOUS) ×1 IMPLANT
PAD ARMBOARD POSITIONER FOAM (MISCELLANEOUS) ×1 IMPLANT
PENCIL SMOKE EVACUATOR (MISCELLANEOUS) ×1 IMPLANT
PULSAVAC PLUS IRRIG FAN TIP (DISPOSABLE) ×1 IMPLANT
SLEEVE FEM ADAPTER V-40 +0 (Sleeve) IMPLANT
SLEEVE SCD COMPRESS KNEE MED (STOCKING) ×1 IMPLANT
SOL .9 NS 3000ML IRR UROMATIC (IV SOLUTION) IMPLANT
SOLUTION IRRIG SURGIPHOR (IV SOLUTION) ×1 IMPLANT
SPONGE T-LAP 18X18 ~~LOC~~+RFID (SPONGE) IMPLANT
STAPLER SKIN PROX 35W (STAPLE) IMPLANT
SURGIFLO W/THROMBIN 8M KIT (HEMOSTASIS) IMPLANT
SUT BONE WAX W31G (SUTURE) ×1 IMPLANT
SUT ETHIBOND 2 V 37 (SUTURE) ×1 IMPLANT
SUT MNCRL AB 3-0 PS2 27 (SUTURE) IMPLANT
SUT PDS AB 0 CT1 27 (SUTURE) IMPLANT
SUT PDS AB 1 CT1 27 (SUTURE) IMPLANT
SUT SILK 0 30XBRD TIE 6 (SUTURE) ×1 IMPLANT
SUT STRATA 1 CT-1 DLB (SUTURE) IMPLANT
SUT STRATAFIX 14 PDO 36 VLT (SUTURE) ×1 IMPLANT
SUT STRATAFIX PDO 1 14 VIOLET (SUTURE) IMPLANT
SUT VIC AB 0 CT1 36 (SUTURE) ×1 IMPLANT
SUT VIC AB 2-0 CT2 27 (SUTURE) ×1 IMPLANT
SUTURE STRATA 1 CT-1 DLB (SUTURE) IMPLANT
SUTURE STRATA SPIR 4-0 18 (SUTURE) ×1 IMPLANT
SWAB CULTURE AMIES ANAERIB BLU (MISCELLANEOUS) IMPLANT
SYR 20ML LL LF (SYRINGE) ×2 IMPLANT
TAPE MICROFOAM 4IN (TAPE) IMPLANT
TIP FAN IRRIG PULSAVAC PLUS (DISPOSABLE) IMPLANT
TOWEL OR 17X26 4PK STRL BLUE (TOWEL DISPOSABLE) IMPLANT
TRAP FLUID SMOKE EVACUATOR (MISCELLANEOUS) ×1 IMPLANT
WAND WEREWOLF FASTSEAL 6.0 (MISCELLANEOUS) ×1 IMPLANT
WATER STERILE IRR 1000ML POUR (IV SOLUTION) ×1 IMPLANT

## 2023-10-17 NOTE — Procedures (Signed)
 Interventional Radiology Procedure Note  Procedure: CT guided aspiration of fluid anterior to neck of left hip prosthesis  Complications: None  Estimated Blood Loss: None  Findings: Fluid seen by MRI targeted by CT with 18 G trocar needle yielding about 1 mL of turbid fluid. Combined with another 1 mL of sterile saline and sent for GS/culture and cell count.  Jodi Marble. Fredia Sorrow, M.D Pager:  [phone]

## 2023-10-17 NOTE — Plan of Care (Signed)

## 2023-10-17 NOTE — Progress Notes (Signed)
 Subjective: Patient seen at bedside and I reviewed the laboratory findings with the patient.  Still nursing some mild pain in the left hip and a general feeling of warmth.  She tolerated the procedure well this morning for the left hip aspiration complications.  She denies any fevers, chills, shortness of breath or chest pain at this time.  Eliquis dose has been held since yesterday morning.  She is n.p.o. from 10 PM last night.   Objective: Vital signs in last 24 hours: Temp:  [97.7 F (36.5 C)-98.1 F (36.7 C)] 97.7 F (36.5 C) (04/08 1000) Pulse Rate:  [53-63] 53 (04/08 1030) Resp:  [12-18] 12 (04/08 1030) BP: (143-192)/(66-78) 155/70 (04/08 1030) SpO2:  [94 %-98 %] 94 % (04/08 1030)  Intake/Output from previous day: 04/07 0701 - 04/08 0700 In: 120 [P.O.:120] Out: -  Intake/Output this shift: No intake/output data recorded.  Recent Labs    10/16/23 0107  HGB 10.1*   Recent Labs    10/16/23 0107  WBC 5.5  RBC 3.29*  HCT 31.1*  PLT 172   Recent Labs    10/16/23 0107  NA 135  K 4.0  CL 103  CO2 23  BUN 18  CREATININE 0.86  GLUCOSE 96  CALCIUM 8.2*   No results for input(s): "LABPT", "INR" in the last 72 hours.  Physical Exam: Left lower extremity Well-healed incision over the anterior left thigh and anterior knee no erythema or localized soft tissue swelling or any fluctuance noted Fullness noted over the left hip grossly unchanged from prior exams in the office No pain with range of motion of the hip or knee at rest no pain with micromotion Some pain in the groin with straight leg raise past 65 degrees no radiation Neurovascular intact distally compartments all soft  negative Homans Dorsiflex and plantarflex the foot and toes intact esophagus pulse  X-rays: X-rays show intact left total hip prosthesis unchanged from prior films. MRI images and report reviewed by myself show redemonstration of an anterior fluid collection over the hip in the tract of the  prior hip surgery deep to the fascia with no sinus tract to the skin.  Slightly larger in size on this MRI compared MRI from January of this year.  With some mild edema in the surrounding musculature.  Agree with radiologist interpretation.   Body fluid cell count with differential Order: [id number]  Status: Edited Result - FINAL     Next appt: 11/10/2023 at 10:00 AM in Cardiology Sherie Don, NP)   Test Result Released: No (scheduled for 10/17/2023 12:55 PM)   0 Result Notes    Component Ref Range & Units (hover) 10:20  Fluid Type-FCT HIP VC  Comment: LEFT CORRECTED ON 04/08 AT 1048: PREVIOUSLY REPORTED AS Left Hip  Color, Fluid ORANGE Abnormal   Appearance, Fluid TURBID Abnormal   Total Nucleated Cell Count, Fluid 14,749  Neutrophil Count, Fluid 93  Lymphs, Fluid 2  Monocyte-Macrophage-Serous Fluid 5  Eos, Fluid 0  Comment: Performed at [facility], [address] [place], [place] [address]     Assessment: Left hip fluid collection concerning for septic arthritis  Plan: I reviewed the clinical and radiographic findings with the patient.  Her cell count is concerning for an infection in her left hip which appears to communicate with the fluid collection.  I reviewed the treatment options with the patient including irrigation debridement with device retention, 1 stage revision, and two-stage revision.  Given the low virulent nature of this potential  infection and the ability to maintain a minimally invasive approach with an anterior washout head and liner exchange we discussed a device retention procedure.  We did review that there is an increased risk of failure of the device retention given the chronicity of this infection is unclear.  We did discuss that should a head and liner exchange fail we would then consider a two-stage full revision procedure likely with a spacer.  The patient understands this increased risk of failure and potential need for additional surgery and  would like to proceed with a device retention irrigation and debridement procedure with placement of antibiotic beads and a drain.  We did review that if the implant was found to be grossly loose that we would consider a 1 stage procedure with full component exchange.  We carefully discussed the risks of surgical operation including infection, damage to nerves, vessels, ligaments, tendons, other nearby structures.  We also placed an infectious disease consultation to assist with antibiotics postoperatively.  We did review she would likely need a PICC line with extended antibiotic treatment.  All questions answered agrees above plan to make preparations for a left hip irrigation debridement with headliner exchange today.  The patient was marked and the consent was filled out and placed on the chart.   Reinaldo Berber 10/17/2023, 1:02 PM

## 2023-10-17 NOTE — Progress Notes (Signed)
Patient remains in OR

## 2023-10-17 NOTE — Care Management Obs Status (Signed)
 MEDICARE OBSERVATION STATUS NOTIFICATION   Patient Details  Name: Amber Barnes MRN: [id number] Date of Birth: 03-13-1951   Medicare Observation Status Notification Given:  Orland Dec, CMA 10/17/2023, 1:43 PM

## 2023-10-17 NOTE — Transfer of Care (Signed)
 Immediate Anesthesia Transfer of Care Note  Patient: Amber Barnes  Procedure(s) Performed: EXCISIONAL TOTAL HIP ARTHROPLASTY WITH PLACEMENT OF ANTIBIOTIC BEADS AND DRAIN (Left: Hip)  Patient Location: PACU  Anesthesia Type:General  Level of Consciousness: drowsy  Airway & Oxygen Therapy: Patient Spontanous Breathing and Patient connected to face mask oxygen  Post-op Assessment: Report given to RN  Post vital signs: stable  Last Vitals:  Vitals Value Taken Time  BP 111/87 10/17/23 1833  Temp    Pulse 74 10/17/23 1837  Resp 17 10/17/23 1837  SpO2 100 % 10/17/23 1837  Vitals shown include unfiled device data.  Last Pain:  Vitals:   10/17/23 1502  TempSrc:   PainSc: 6          Complications:  Encounter Notable Events  Notable Event Outcome Phase Comment  Difficult to intubate - expected  Intraprocedure Filed from anesthesia note documentation.

## 2023-10-17 NOTE — Assessment & Plan Note (Signed)
 S/p ablation and remained in sinus rhythm -Continue with home bisoprolol and Eliquis

## 2023-10-17 NOTE — Assessment & Plan Note (Signed)
 Continue home Lexapro

## 2023-10-17 NOTE — Op Note (Signed)
 Patient Name: Amber Barnes  WGN:[id number]  Pre-Operative Diagnosis: Left hip septic arthritis status post total hip arthroplasty  Post-Operative Diagnosis: (same)  Procedure: Left hip irrigation debridement with head and liner hip revision, placement of antibiotic beads, drain placement  Components/Implants: Cup: Unchanged    liner: Neutral 36/D poly stem: Unchanged head: Biolox ceramic 36 mm universal with +0 mm adapter sleeve  Date of Surgery: 10/17/2023  Surgeon: Reinaldo Berber MD  Assistant: Rocky Link RNFA (present and scrubbed throughout the case, critical for assistance with exposure, retraction, instrumentation, and closure)   Anesthesiologist: Joelene Millin  Anesthesia: General   EBL: 200cc  IVF:500cc  Complications: None   Brief history: The patient is a 73 year old female presented to the emergency room with increasing left hip pain and after undergoing MRI of the left hip was found to have a fluid collection.  Patient had no systemic symptoms for infection at that time but based on the appearance of the collection the decision was made to have it aspirated.  The cell count was found to be significantly elevated and an extensive conversation was had with the patient about surgical options including irrigation debridement with device retention and the possibility of 1 stage or two-stage revision.  Under shared decision making model the patient elected move forward with a irrigation debridement with device retention should the implants appear well-fixed to the bone.  All preoperative films were reviewed and an appropriate surgical plan was made prior to surgery.   Description of procedure: The patient was brought to the operating room where laterality was confirmed by all those present to be the left side.  The patient was administered spinal anesthesia on a stretcher prior to being moved supine on the operating room table. Patient was given an intravenous dose of TXA antibiotics were  held until cultures were obtained.  All bony prominences and extremities were well padded and the patient was securely attached to the table boots, a perineal post was placed and the patient had a safety strap placed.  Surgical site was prepped with alcohol and chlorhexidine. The surgical site over the hip was and draped in typical sterile fashion with multiple layers of adhesive and nonadhesive drapes.  The incision site was marked out with a sterile marker in line with the prior incision and care was taken to assess the position of the ASIS and ensure appropriate position for the incision.    A surgical timeout was then called with participation of all staff in the room the patient was then a confirmed again and laterality confirmed.  Incision was made over the anterior lateral aspect of the proximal thigh in line with the TFL and the previous incision.  Careful dissection was taken down to the subcutaneous tissues to the fascia overlying the TFL.  An incision was made in the fascia and the interval was carefully identified.  After dissecting through the fascia and deep into the interval purulent fluid was encountered which tracked down to the anterior femoral neck.  Cultures were taken of the fluid at this time from the collection and off the femoral neck.  Additional cultures were taken with synovial tissue from around the acetabulum and the femoral component.  A full circumferential debridement was made around the femoral neck and the prior repaired capsule was removed anteriorly.  Electrocautery and sharp debridement were used to fully clear around the entire femoral acetabular components. The hip was then irrigated with pulsatile lavage and cleared of all debris. The hip was then carefully dislocated the  head was tamped off.  A full evaluation of the femoral component was taken circumferentially at this time and was found to be well-fixed without any motion after pulling on the component with  significant force.  The acetabulum was fully exposed and cleared and the old polyethylene was removed using 1/4 inch curved osteotome with care taken not to damage the locking ring.  The bone behind the acetabulum was well-appearing with no evidence of bony erosion or retroacetabular infection.  The hip was then irrigated again with pulsatile lavage.  All exposed surfaces of metal and bony interfaces were then cleared with Versajet.  The hip was then soaked with dilute Betadine while the surgical field was cleaned all of the retractors were cleaned and changed and the entire surgical team changed gloves.  After the Betadine was allowed to soak it was irrigated out the hip was reexposed and a new liner was placed and impacted into position.  It was checked for stability and found to be well fixed.  The trunnion was then cleaned and a new ball was impacted into place.  The hip was reduced and checked under fluoroscopic imagery to ensure that it was in good position without of any periprosthetic fracture.  It was found to be stable through testing and flexion internal rotation and extension with external rotation.  The hip was then irrigated and allowed to soak with Irrisept solution.  A drain was placed through the inferior lateral tissues deep to the fascia.  Irrisept was irrigated out and normal saline was then irrigated again.  All bleeding vessels were coagulated and there was no active bleeding within the hip.  Stimulan beads with vancomycin and tobramycin were then placed within the hip. The fascia was closed with #1 interrupted PDS figure-of-eight sutures for a watertight closure care was taken not to stitch in the drain.  The deep tissues were closed with 0 PDS sutures and the subcutaneous tissues were closed with 3-0 Monocryl.  The skin was then closed with staples and a incisional VAC was placed.  The patient was awoken from anesthesia transferred off of the operating room table onto a hospital bed  where examination of leg lengths found the leg lengths to be equal  with a good distal pulse.  The patient was then transferred to the PACU in stable condition.  Infectious disease consult will be obtained to assist with antibiotic guidance and the patient will likely need a PICC line for extended antibiotic treatment postoperatively.  She may weight-bear as tolerated on the hip.

## 2023-10-17 NOTE — Hospital Course (Addendum)
 Taken from H&P.    Amber Barnes is a 73 y.o. female with medical history significant of multiple OA status post left hip replacement and recent left knee replacement, HTN, PAF status post ablation on Eliquis, chronic HFpEF, HLD, breast cancer status post chemo/immunotherapy/radiation and surgery, hypothyroidism, presented with worsening of left hip pain.   Patient had left hip replacement back in October 2024 and has been doing well. Lately she has had worsening of left knee pain and underwent left knee TKR and started on PT therapy 2 weeks ago and she has been doing well.   On presentation vital and labs stable.X-ray of left hip showed intact hardware,   X-ray left knee showed no acute findings.  Lumbar spine MRI showed multiple level degenerative changes but most are chronic including findings on L1-L2 L2-L3 L3-4 and L4-L5.  MRI of hip with increased size of heterogenous fluid collection along the surgical track, concerning for abscess with surrounding soft tissue and intramuscular edema.  Orthopedic surgery was consulted and left hip arthrocentesis with IR was ordered.  Antibiotics were held until cultures were taken. Also concern of L3 radiculitis.  4/8: Vitals with mildly elevated blood pressure at 155/67.  CRP significantly elevated at 17, ESR 80.  IR guided arthrocentesis with WBC of 14 749, 93% neutrophil.  Orthopedic surgery is going to take her to the OR for washout later today.  4/9: Vital stable with borderline bradycardia, s/p excisional total hip arthroplasty with placement of antibiotic beads and drain on left on 4/8.  Labs with decrease of hemoglobin to 9, likely secondary to blood loss during surgery, starting on supplement.  Preliminary cultures with no organisms seen.  Patient will be weightbearing as tolerated on left extremity per orthopedic surgery note. Orthopedic surgery ordered UA as she was complaining of mild dysuria likely due to Foley catheter placement during  surgery, it was removed earlier this morning.  Patient is able to void.  UA with mild hematuria only.  Urine cultures were also ordered by orthopedic surgery, likely just had mild cystitis/inflammation with Foley and not an infection. Started on ceftriaxone-ID was also consulted at orthopedic request.  4/10: Remained hemodynamically stable.  Urine cultures with no growth.  Multiple cultures from hip joint and only warm is growing Methicillin-resistant Staphylococcus lugdunensis-started on vancomycin   CBC with hemoglobin of 8.6-all cell line decreased. Drain was removed, patient will go home with wound VAC in place.  4/11:2/3 intraoperative cultures started growing Staphylococcus lugdunensis, ID switched antibiotics to daptomycin, PICC line was ordered and then later decided to use her existing port.  Crestor dose was decreased to 5 mg daily while taking daptomycin.  She will need 6 weeks of daptomycin with end date of 11/27/2023.  ID is arranging home antibiotic and likely can go home tomorrow.

## 2023-10-17 NOTE — Progress Notes (Signed)
 Patient asking about morning meds, MD messaged as patient is NPO, MD stated to hold meds until after surgery today.

## 2023-10-17 NOTE — Assessment & Plan Note (Signed)
 Continue home Crestor

## 2023-10-17 NOTE — Assessment & Plan Note (Signed)
-

## 2023-10-17 NOTE — Anesthesia Procedure Notes (Signed)
 Procedure Name: Intubation Date/Time: 10/17/2023 3:57 PM  Performed by: Lind Guest, CRNAPre-anesthesia Checklist: Patient identified, Emergency Drugs available, Suction available and Patient being monitored Patient Re-evaluated:Patient Re-evaluated prior to induction Oxygen Delivery Method: Circle system utilized Preoxygenation: Pre-oxygenation with 100% oxygen Induction Type: IV induction Ventilation: Mask ventilation without difficulty Laryngoscope Size: McGrath and 3 Grade View: Grade II Tube type: Oral Tube size: 7.0 mm Number of attempts: 1 Airway Equipment and Method: Stylet and Oral airway Placement Confirmation: ETT inserted through vocal cords under direct vision, positive ETCO2 and breath sounds checked- equal and bilateral Secured at: 22 cm Tube secured with: Tape Dental Injury: Teeth and Oropharynx as per pre-operative assessment  Difficulty Due To: Difficulty was anticipated Comments: Used McGrath first attempt due to small oral opening

## 2023-10-17 NOTE — Assessment & Plan Note (Signed)
 Clinically appears euvolemic. - Continue home low-dose Lasix

## 2023-10-17 NOTE — Anesthesia Preprocedure Evaluation (Signed)
 Anesthesia Evaluation  Patient identified by MRN, date of birth, ID band Patient awake    Reviewed: Allergy & Precautions, NPO status , Patient's Chart, lab work & pertinent test results  History of Anesthesia Complications (+) Emergence Delirium and history of anesthetic complications  Airway Mallampati: III  TM Distance: >3 FB Neck ROM: full    Dental  (+) Chipped, Dental Advidsory Given   Pulmonary neg shortness of breath, sleep apnea , COPD, neg recent URI, former smoker   Pulmonary exam normal        Cardiovascular hypertension, On Medications and On Home Beta Blockers (-) angina + CAD and +CHF  (-) Past MI and (-) Cardiac Stents + dysrhythmias (s/p ablation) Atrial Fibrillation (-) Valvular Problems/Murmurs  Follow-up limited TTE status post cardiac ablation performed on 12/13/2022.  EF had improved to 50-55%.  There were no regional wall motion abnormalities.  Mild LVH noted.  There was moderate mitral annular calcification.   Per cardiology, "Ms. Bayard's perioperative risk of a major cardiac event is 0.4% according to the Revised Cardiac Risk Index (RCRI).  Therefore, she is at LOW risk for perioperative complications.  According to ACC/AHA guidelines, no further cardiovascular testing needed.  The patient may proceed to surgery at ACCEPTABLE risk."   Neuro/Psych  Headaches, neg Seizures PSYCHIATRIC DISORDERS Anxiety Depression     Neuromuscular disease    GI/Hepatic Neg liver ROS, PUD,GERD  Controlled,,  Endo/Other  Hypothyroidism  Class 3 obesity  Renal/GU Renal disease     Musculoskeletal   Abdominal   Peds  Hematology   Anesthesia Other Findings Past Medical History: No date: Achilles tendinitis No date: Acute medial meniscal tear No date: Allergy No date: Anemia No date: Anxiety     Comment:  a.) on BZO PRN (alprazolam) No date: Aortic atherosclerosis (HCC) 10/20/2022: Ascending aorta dilatation (HCC)      Comment:  a.) TTE 10/20/2022: asc Ao 39 mm 07/2016: Atrial fibrillation/flutter (HCC)     Comment:  a.) Dx'd 07/2016; b.) CHA2DS2-VASc = 5 (age, sex,               HFmrEF, HTN, vasc disease history) as of 05/09/2023; c.)               s/p DCCV 09/07/2016 (150 J x 2 --> A.flutter; 200 J x 1               --> NSR); d.) s/p DCCV 10/19/2016 (150 J x 1 --> NSR);               e.) brought in for DCCV 12/2016; cancelled d/t NSR; f.)               s/p PVI ablation 12/12/2022; g.) cardiac rate/rhythm               maintained on oral bisoprolol; chronically anticoagulated              using apixaban No date: CAD (coronary artery disease)     Comment:  a.) cCTA 12/08/2022: Ca2+ = 31.6 (58th %'ile for               age/sex/race match control) 05/18/2021: Chemotherapy induced nausea and vomiting No date: Chronic heart failure with mid-range ejection fraction  (HFmrEF) (HCC)     Comment:  a.) TTE 03/08/21: EF 60-65, mild LVH, G1DD, mild LAE,               triv MR; b.) TTE 07/29/21: EF 60-65, G1DD, mild  LAE, triv               MR; c.) TTE 11/26/21: EF 55-60, G1DD, mild LAE, mild MR;               d.) TTE 02/25/22: EF 60-65, G1DD, mild LAE, mild MR; e.)               TTE 05/31/22: EF 55-60, mild LVH, G2DD, mild-mod MR; f.)               TTE 10/20/22: EF 45-50, mild LAE, mod red RVSF, mild-mod               TR, Asc Ao 39; g.) TTE 12/13/22: EF 50-55, mild LVH, mild               MAC No date: CKD (chronic kidney disease), stage II No date: Colon polyp No date: Complication of anesthesia     Comment:  a.) emergence delirium; b.) hypoxia after hip               replacement No date: COPD (chronic obstructive pulmonary disease) (HCC) No date: DDD (degenerative disc disease), lumbar     Comment:  a.) s/p fusion No date: Depression No date: Endometriosis No date: Fibrocystic breast disease No date: GERD (gastroesophageal reflux disease) No date: History of stress test     Comment:  a.) MV 07/29/2016: EF  55-65%. Small, mild defect  in mid              anteroseptal, apical septal, and apical locations. No               ischemia-->low risk; b.) MV 10/2022: mod sized partially               rev apical ant, apical septal, and apical defect. EF 52%.              No signific cor Ca2+; c.) cCTA 12/08/2022: Ca2+ = 31.6               (58th %'ile). No date: History of tobacco use No date: Hyperlipidemia No date: Hypertension No date: Hypothyroidism     Comment:  a.) s/p partial thyroidectomy for nodule; pathology               benign No date: Insomnia No date: Lipoma 03/03/2021: Malignant neoplasm of upper-outer quadrant of right  female breast (HCC)     Comment:  a.) stage 1A invasive mammary carcinoma (cT1b or T1c               N0); ER (-), PR (+), HER2/neu (+); did not tolerate               adjuvant chemotherapy (paclitacel + trastuzumab) despite               paclitaxel dose reduction; s/p adjuvant XRT; cinished               maintenance trastuzumab 07/27/2022; no adjuvant endocrine               therapy as no clear benefit No date: Migraine No date: Nephrolithiasis No date: On apixaban therapy No date: OSA (obstructive sleep apnea)     Comment:  a.) non-compliant with nocturnal PAP therapy No date: Osteoarthritis No date: Osteopenia No date: Peptic ulcer disease     Comment:  a.) H. pylori 12/12/2022: Pericarditis     Comment:  a.) following A.fib ablation; resolved No date:  Pneumonia No date: Port-A-Cath in place No date: RLS (restless legs syndrome)     Comment:  a.) on ropinirole No date: Vitamin D deficiency  Past Surgical History: No date: ABDOMINAL HYSTERECTOMY     Comment:  ovaries left No date: ACHILLES TENDON SURGERY     Comment:  2012,01/2017 12/12/2022: ATRIAL FIBRILLATION ABLATION; N/A     Comment:  Procedure: ATRIAL FIBRILLATION ABLATION;  Surgeon:               Lanier Prude, MD;  Location: [facility];                Service: Cardiovascular;   Laterality: N/A; 01/15/2021: BREAST BIOPSY; Right     Comment:  Affirm biopsy-"X" clip-INVASIVE MAMMARY CARCINOMA 01/15/2021: BREAST BIOPSY; Right     Comment:  u/s bx-"venus" clip INVASIVE MAMMARY CARCINOMA No date: BREAST BIOPSY 01/04/2023: BREAST BIOPSY; Right     Comment:  u/s bx ribbon clip path pending 01/04/2023: BREAST BIOPSY; Right     Comment:  [place] RT BREAST BX W LOC DEV 1ST LESION IMG BX SPEC [place]               GUIDE 01/04/2023 [facility] 02/17/2021: BREAST LUMPECTOMY,RADIO FREQ LOCALIZER,AXILLARY SENTINEL  LYMPH NODE BIOPSY; Right     Comment:  Procedure: BREAST LUMPECTOMY,RADIO FREQ               LOCALIZER,AXILLARY SENTINEL LYMPH NODE BIOPSY;  Surgeon:               Campbell Lerner, MD;  Location: [facility];  Service:               General;  Laterality: Right; 09/07/2016: CARDIOVERSION; N/A     Comment:  Procedure: CARDIOVERSION;  Surgeon: Antonieta Iba,               MD;  Location: [facility];  Service: Cardiovascular;                Laterality: N/A; 10/19/2016: CARDIOVERSION; N/A     Comment:  Procedure: CARDIOVERSION;  Surgeon: Antonieta Iba,               MD;  Location: [facility];  Service: Cardiovascular;                Laterality: N/A; 12/14/2016: CARDIOVERSION; N/A     Comment:  Procedure: CARDIOVERSION;  Surgeon: Antonieta Iba,               MD;  Location: [facility];  Service: Cardiovascular;                Laterality: N/A; No date: COLONOSCOPY 05/13/2020: COLONOSCOPY WITH PROPOFOL; N/A     Comment:  Procedure: COLONOSCOPY WITH PROPOFOL;  Surgeon: Tol[place],               Boykin Nearing, MD;  Location: [facility];  Service:               Gastroenterology;  Laterality: N/A;  ELIQUIS 07/12/2021: CYSTOSCOPY WITH STENT PLACEMENT; Bilateral     Comment:  Procedure: CYSTOSCOPY WITH STENT PLACEMENT;  Surgeon:               Sondra Come, MD;  Location: [facility];  Service:               Urology;  Laterality: Bilateral; 07/30/2021:  CYSTOSCOPY/URETEROSCOPY/HOLMIUM LASER/STENT PLACEMENT;  Bilateral     Comment:  Procedure: CYSTOSCOPY/URETEROSCOPY/HOLMIUM  LASER/BILATERAL STENT REMOVAL;  Surgeon: Sondra Come, MD;  Location: [facility];  Service: Urology;                Laterality: Bilateral; No date: ESOPHAGOGASTRODUODENOSCOPY 05/13/2020: ESOPHAGOGASTRODUODENOSCOPY (EGD) WITH PROPOFOL; N/A     Comment:  Procedure: ESOPHAGOGASTRODUODENOSCOPY (EGD) WITH               PROPOFOL;  Surgeon: Toledo, Boykin Nearing, MD;  Location:               [facility];  Service: Gastroenterology;  Laterality:               N/A; No date: FOOT SURGERY; Left     Comment:  tendon No date: LUMBAR FUSION 03/23/2021: PORTA CATH INSERTION; N/A     Comment:  Procedure: PORTA CATH INSERTION;  Surgeon: Renford Dills, MD;  Location: [facility];  Service:              Cardiovascular;  Laterality: N/A; 03/23/2021: PORTACATH PLACEMENT; N/A     Comment:  Procedure: ATTEMPTED INSERTION PORT-A-CATH;  Surgeon:               Campbell Lerner, MD;  Location: [facility];  Service:               General;  Laterality: N/A; 1990: THYROIDECTOMY     Comment:  half thyroid lobectomy secondary to a benign nodule 08/21/2018: TOTAL HIP ARTHROPLASTY; Right     Comment:  Procedure: TOTAL HIP ARTHROPLASTY ANTERIOR               APPROACH-RIGHT CELL SAVER REQUESTED;  Surgeon: Kennedy Bucker, MD;  Location: [facility];  Service: Orthopedics;               Laterality: Right;  BMI    Body Mass Index: 40.02 kg/m      Reproductive/Obstetrics negative OB ROS                             Anesthesia Physical Anesthesia Plan  ASA: 3  Anesthesia Plan: General   Post-op Pain Management: Ofirmev IV (intra-op)* and Toradol IV (intra-op)*   Induction: Intravenous  PONV Risk Score and Plan: 2 and Dexamethasone, Ondansetron and Treatment may vary due to age or medical  condition  Airway Management Planned: LMA  Additional Equipment:   Intra-op Plan:   Post-operative Plan: Extubation in OR  Informed Consent: I have reviewed the patients History and Physical, chart, labs and discussed the procedure including the risks, benefits and alternatives for the proposed anesthesia with the patient or authorized representative who has indicated his/her understanding and acceptance.     Dental Advisory Given  Plan Discussed with: Anesthesiologist, CRNA and Surgeon  Anesthesia Plan Comments: (Patient consented for risks of anesthesia including but not limited to:  - adverse reactions to medications - damage to eyes, teeth, lips or other oral mucosa - nerve damage due to positioning  - sore throat or hoarseness - Damage to heart, brain, nerves, lungs, other parts of body or loss of life  Patient voiced understanding and assent.)        Anesthesia Quick Evaluation

## 2023-10-17 NOTE — Progress Notes (Signed)
 Patient clinically stable post CT Hip aspiration per Dr Fredia Sorrow, tolerated well. Vitals stable pre and post procedure.  Denies complaints immediately post procedure. Received Versed 2 mg along with Fentanyl 50 mcg IV for procedure.report given to [facility] RN post procedure/specials/1

## 2023-10-17 NOTE — Plan of Care (Signed)
   Problem: Education: Goal: Knowledge of General Education information will improve Description: Including pain rating scale, medication(s)/side effects and non-pharmacologic comfort measures Outcome: Progressing   Problem: Health Behavior/Discharge Planning: Goal: Ability to manage health-related needs will improve Outcome: Progressing   Problem: Clinical Measurements: Goal: Ability to maintain clinical measurements within normal limits will improve Outcome: Progressing Goal: Will remain free from infection Outcome: Progressing Goal: Diagnostic test results will improve Outcome: Progressing Goal: Respiratory complications will improve Outcome: Progressing Goal: Cardiovascular complication will be avoided Outcome: Progressing   Problem: Coping: Goal: Level of anxiety will decrease Outcome: Progressing   Problem: Nutrition: Goal: Adequate nutrition will be maintained Outcome: Progressing   Problem: Activity: Goal: Risk for activity intolerance will decrease Outcome: Progressing   Problem: Elimination: Goal: Will not experience complications related to bowel motility Outcome: Progressing Goal: Will not experience complications related to urinary retention Outcome: Progressing

## 2023-10-17 NOTE — Consult Note (Addendum)
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 visit.)

## 2023-10-17 NOTE — Progress Notes (Signed)
 Progress Note   Patient: Amber Barnes ZOX:[id number] DOB: Dec 15, 1950 DOA: 10/16/2023     0 DOS: the patient was seen and examined on 10/17/2023   Brief hospital course: Taken from H&P.    Mayzee Reichenbach is a 73 y.o. female with medical history significant of multiple OA status post left hip replacement and recent left knee replacement, HTN, PAF status post ablation on Eliquis, chronic HFpEF, HLD, breast cancer status post chemo/immunotherapy/radiation and surgery, hypothyroidism, presented with worsening of left hip pain.   Patient had left hip replacement back in October 2024 and has been doing well. Lately she has had worsening of left knee pain and underwent left knee TKR and started on PT therapy 2 weeks ago and she has been doing well.   On presentation vital and labs stable.X-ray of left hip showed intact hardware,   X-ray left knee showed no acute findings.  Lumbar spine MRI showed multiple level degenerative changes but most are chronic including findings on L1-L2 L2-L3 L3-4 and L4-L5.  MRI of hip with increased size of heterogenous fluid collection along the surgical track, concerning for abscess with surrounding soft tissue and intramuscular edema.  Orthopedic surgery was consulted and left hip arthrocentesis with IR was ordered.  Antibiotics were held until cultures were taken. Also concern of L3 radiculitis.  4/8: Vitals with mildly elevated blood pressure at 155/67.  CRP significantly elevated at 17, ESR 80.  IR guided arthrocentesis with WBC of 14 749, 93% neutrophil.  Orthopedic surgery is going to take her to the OR for washout later today.   Assessment and Plan: * Left hip pain History of significant arthritis s/p left hip and knee replacement. MRI with concern of fluid collection/abscess, s/p arthrocentesis with IR-preliminary labs with WBC of above 14,000 with 93% neutrophil.  No fever or leukocytosis - Orthopedic surgery is on board-taking her to the OR  today -Holding antibiotics for now, we will start after surgery as needed -Continue with supportive care and pain management  Atrial fibrillation/flutter [facility]) S/p ablation and remained in sinus rhythm -Continue with home bisoprolol and Eliquis  Hypertension Blood pressure mildly elevated.  Patient was taking low-dose losartan and hydrochlorothiazide combination pill as needed at home. - Continue home losartan and hydrochlorothiazide - As needed hydralazine  Depression - Continue home Lexapro  Hyperlipidemia - Continue home Crestor  Chronic heart failure with preserved ejection fraction (HFpEF) (HCC) Clinically appears euvolemic. - Continue home low-dose Lasix  RLS (restless legs syndrome) - Continue home Requip   Subjective: Patient was feeling quite upset that she might have to go to operating room and concern of infection in her hip joint, per patient she was doing very well after the surgery. Continue to have significant pain.  Physical Exam: Vitals:   10/17/23 0952 10/17/23 1000 10/17/23 1015 10/17/23 1030  BP: (!) 166/76 (!) 143/67 (!) 170/72 (!) 155/70  Pulse: (!) 57 (!) 56 (!) 59 (!) 53  Resp: 15 15 13 12   Temp:  97.7 F (36.5 C)    TempSrc:  Oral    SpO2: 97% 96% 97% 94%  Weight:      Height:       General.  Morbidly obese lady, in no acute distress. Pulmonary.  Lungs clear bilaterally, normal respiratory effort. CV.  Regular rate and rhythm, no JVD, rub or murmur. Abdomen.  Soft, nontender, nondistended, BS positive. CNS.  Alert and oriented .  No focal neurologic deficit. Extremities.  No edema, no cyanosis, pulses intact and symmetrical. Psychiatry.  Judgment and insight appears normal.   Data Reviewed: Prior data reviewed  Family Communication: Discussed with her husband on phone  Disposition: Status is: Observation The patient will require care spanning > 2 midnights and should be moved to inpatient because: Severity of illness  Planned  Discharge Destination:  To be determined  DVT prophylaxis.  Eliquis Time spent: 45 minutes  This record has been created using Conservation officer, historic buildings. Errors have been sought and corrected,but may not always be located. Such creation errors do not reflect on the standard of care.   Author: Arnetha Courser, MD 10/17/2023 1:13 PM  For on call review [email or web address].

## 2023-10-17 NOTE — Assessment & Plan Note (Signed)
 History of significant arthritis s/p left hip and knee replacement. MRI with concern of fluid collection/abscess, s/p arthrocentesis with IR-preliminary labs with WBC of above 14,000 with 93% neutrophil.  No fever or leukocytosis - Orthopedic surgery is on board-taking her to the OR today -Holding antibiotics for now, we will start after surgery as needed -Continue with supportive care and pain management

## 2023-10-17 NOTE — Assessment & Plan Note (Signed)
 Blood pressure mildly elevated.  Patient was taking low-dose losartan and hydrochlorothiazide combination pill as needed at home. - Continue home losartan and hydrochlorothiazide - As needed hydralazine

## 2023-10-18 ENCOUNTER — Ambulatory Visit: Admitting: Cardiology

## 2023-10-18 DIAGNOSIS — I5032 Chronic diastolic (congestive) heart failure: Secondary | ICD-10-CM | POA: Diagnosis not present

## 2023-10-18 DIAGNOSIS — I10 Essential (primary) hypertension: Secondary | ICD-10-CM | POA: Diagnosis not present

## 2023-10-18 DIAGNOSIS — Z8349 Family history of other endocrine, nutritional and metabolic diseases: Secondary | ICD-10-CM | POA: Diagnosis not present

## 2023-10-18 DIAGNOSIS — G2581 Restless legs syndrome: Secondary | ICD-10-CM | POA: Diagnosis not present

## 2023-10-18 DIAGNOSIS — I700 Atherosclerosis of aorta: Secondary | ICD-10-CM | POA: Diagnosis present

## 2023-10-18 DIAGNOSIS — G4733 Obstructive sleep apnea (adult) (pediatric): Secondary | ICD-10-CM | POA: Diagnosis present

## 2023-10-18 DIAGNOSIS — I5042 Chronic combined systolic (congestive) and diastolic (congestive) heart failure: Secondary | ICD-10-CM | POA: Diagnosis present

## 2023-10-18 DIAGNOSIS — I480 Paroxysmal atrial fibrillation: Secondary | ICD-10-CM | POA: Diagnosis present

## 2023-10-18 DIAGNOSIS — R350 Frequency of micturition: Secondary | ICD-10-CM

## 2023-10-18 DIAGNOSIS — T8450XA Infection and inflammatory reaction due to unspecified internal joint prosthesis, initial encounter: Secondary | ICD-10-CM | POA: Diagnosis not present

## 2023-10-18 DIAGNOSIS — B957 Other staphylococcus as the cause of diseases classified elsewhere: Secondary | ICD-10-CM | POA: Diagnosis not present

## 2023-10-18 DIAGNOSIS — I130 Hypertensive heart and chronic kidney disease with heart failure and stage 1 through stage 4 chronic kidney disease, or unspecified chronic kidney disease: Secondary | ICD-10-CM | POA: Diagnosis present

## 2023-10-18 DIAGNOSIS — B958 Unspecified staphylococcus as the cause of diseases classified elsewhere: Secondary | ICD-10-CM | POA: Diagnosis not present

## 2023-10-18 DIAGNOSIS — J449 Chronic obstructive pulmonary disease, unspecified: Secondary | ICD-10-CM | POA: Diagnosis present

## 2023-10-18 DIAGNOSIS — M25552 Pain in left hip: Secondary | ICD-10-CM | POA: Diagnosis not present

## 2023-10-18 DIAGNOSIS — Z79899 Other long term (current) drug therapy: Secondary | ICD-10-CM | POA: Diagnosis not present

## 2023-10-18 DIAGNOSIS — N182 Chronic kidney disease, stage 2 (mild): Secondary | ICD-10-CM | POA: Diagnosis present

## 2023-10-18 DIAGNOSIS — R300 Dysuria: Secondary | ICD-10-CM

## 2023-10-18 DIAGNOSIS — G8929 Other chronic pain: Secondary | ICD-10-CM | POA: Diagnosis present

## 2023-10-18 DIAGNOSIS — I4891 Unspecified atrial fibrillation: Secondary | ICD-10-CM | POA: Diagnosis not present

## 2023-10-18 DIAGNOSIS — T8452XA Infection and inflammatory reaction due to internal left hip prosthesis, initial encounter: Secondary | ICD-10-CM | POA: Diagnosis not present

## 2023-10-18 DIAGNOSIS — Z87891 Personal history of nicotine dependence: Secondary | ICD-10-CM | POA: Diagnosis not present

## 2023-10-18 DIAGNOSIS — R262 Difficulty in walking, not elsewhere classified: Secondary | ICD-10-CM | POA: Diagnosis not present

## 2023-10-18 DIAGNOSIS — Z9221 Personal history of antineoplastic chemotherapy: Secondary | ICD-10-CM | POA: Diagnosis not present

## 2023-10-18 DIAGNOSIS — M5416 Radiculopathy, lumbar region: Secondary | ICD-10-CM | POA: Diagnosis not present

## 2023-10-18 DIAGNOSIS — D62 Acute posthemorrhagic anemia: Secondary | ICD-10-CM

## 2023-10-18 DIAGNOSIS — Z7901 Long term (current) use of anticoagulants: Secondary | ICD-10-CM | POA: Diagnosis not present

## 2023-10-18 DIAGNOSIS — F32A Depression, unspecified: Secondary | ICD-10-CM | POA: Diagnosis present

## 2023-10-18 DIAGNOSIS — Z96653 Presence of artificial knee joint, bilateral: Secondary | ICD-10-CM | POA: Diagnosis present

## 2023-10-18 DIAGNOSIS — I4892 Unspecified atrial flutter: Secondary | ICD-10-CM | POA: Diagnosis not present

## 2023-10-18 DIAGNOSIS — Z8249 Family history of ischemic heart disease and other diseases of the circulatory system: Secondary | ICD-10-CM | POA: Diagnosis not present

## 2023-10-18 DIAGNOSIS — I2510 Atherosclerotic heart disease of native coronary artery without angina pectoris: Secondary | ICD-10-CM | POA: Diagnosis present

## 2023-10-18 DIAGNOSIS — E890 Postprocedural hypothyroidism: Secondary | ICD-10-CM | POA: Diagnosis present

## 2023-10-18 DIAGNOSIS — E785 Hyperlipidemia, unspecified: Secondary | ICD-10-CM | POA: Diagnosis not present

## 2023-10-18 DIAGNOSIS — Y831 Surgical operation with implant of artificial internal device as the cause of abnormal reaction of the patient, or of later complication, without mention of misadventure at the time of the procedure: Secondary | ICD-10-CM | POA: Diagnosis present

## 2023-10-18 LAB — URINALYSIS, COMPLETE (UACMP) WITH MICROSCOPIC
Bilirubin Urine: NEGATIVE
Glucose, UA: NEGATIVE mg/dL
Ketones, ur: NEGATIVE mg/dL
Leukocytes,Ua: NEGATIVE
Nitrite: NEGATIVE
Protein, ur: NEGATIVE mg/dL
Specific Gravity, Urine: 1.023 (ref 1.005–1.030)
pH: 5 (ref 5.0–8.0)

## 2023-10-18 LAB — BASIC METABOLIC PANEL WITH GFR
Anion gap: 6 (ref 5–15)
BUN: 15 mg/dL (ref 8–23)
CO2: 25 mmol/L (ref 22–32)
Calcium: 7.8 mg/dL — ABNORMAL LOW (ref 8.9–10.3)
Chloride: 105 mmol/L (ref 98–111)
Creatinine, Ser: 0.85 mg/dL (ref 0.44–1.00)
GFR, Estimated: >60 mL/min (ref >60)
Glucose, Bld: 97 mg/dL (ref 70–99)
Potassium: 4.3 mmol/L (ref 3.5–5.1)
Sodium: 136 mmol/L (ref 135–145)

## 2023-10-18 LAB — CBC
HCT: 25.9 % — ABNORMAL LOW (ref 36.0–46.0)
Hemoglobin: 9 g/dL — ABNORMAL LOW (ref 12.0–15.0)
MCH: 30.7 pg (ref 26.0–34.0)
MCHC: 34.7 g/dL (ref 30.0–36.0)
MCV: 88.4 fL (ref 80.0–100.0)
Platelets: 209 10*3/uL (ref 150–400)
RBC: 2.93 MIL/uL — ABNORMAL LOW (ref 3.87–5.11)
RDW: 12.6 % (ref 11.5–15.5)
WBC: 7.4 10*3/uL (ref 4.0–10.5)
nRBC: 0 % (ref 0.0–0.2)

## 2023-10-18 MED ORDER — DIPHENHYDRAMINE HCL 25 MG PO CAPS
25.0000 mg | ORAL_CAPSULE | Freq: Four times a day (QID) | ORAL | Status: DC | PRN
  Administered 2023-10-18 (×2): 25 mg via ORAL
  Filled 2023-10-18 (×2): qty 1

## 2023-10-18 MED ORDER — FE FUM-VIT C-VIT B12-FA 460-60-0.01-1 MG PO CAPS
1.0000 | ORAL_CAPSULE | Freq: Two times a day (BID) | ORAL | Status: DC
  Administered 2023-10-18 – 2023-10-22 (×9): 1 via ORAL
  Filled 2023-10-18 (×9): qty 1

## 2023-10-18 MED ORDER — SODIUM CHLORIDE 0.9 % IV SOLN
2.0000 g | INTRAVENOUS | Status: DC
  Administered 2023-10-18 – 2023-10-19 (×2): 2 g via INTRAVENOUS
  Filled 2023-10-18 (×3): qty 20

## 2023-10-18 MED ORDER — TRAMADOL HCL 50 MG PO TABS
50.0000 mg | ORAL_TABLET | Freq: Four times a day (QID) | ORAL | Status: DC | PRN
  Administered 2023-10-18 – 2023-10-21 (×6): 50 mg via ORAL
  Filled 2023-10-18 (×7): qty 1

## 2023-10-18 NOTE — Progress Notes (Signed)
 Physical Therapy Treatment Patient Details Name: Amber Barnes MRN: [id number] DOB: 1950-07-27 Today's Date: 10/18/2023   History of Present Illness Pt is a 73 yo F admitted with increased pain in L hip, she has history of recent  L TKA 1 month ago, L THA in 2024. Now s/p L THA with antibiotic beads. PMH includes BLE THA, COPD, HTN, A-fib, and anxiety.  Of note pt reports history of cardiac ablation procedure with baseline HR of 50-60 bpm.    PT Comments  Patient received in bed, she reports her bed is wet, pure wick was not working properly. Patient is mod I with bed mobility and transfers. She ambulated 220 feet with RW and supervision. Mild increased pain in hip since this am. Patient is moving well and progressing well. She will continue to benefit from skilled PT to improve strength and activity tolerance.       If plan is discharge home, recommend the following: A little help with walking and/or transfers;A little help with bathing/dressing/bathroom   Can travel by private vehicle      yes  Equipment Recommendations  None recommended by PT    Recommendations for Other Services       Precautions / Restrictions Precautions Precautions: Anterior Hip;Fall Recall of Precautions/Restrictions: Intact Restrictions Weight Bearing Restrictions Per Provider Order: Yes LLE Weight Bearing Per Provider Order: Weight bearing as tolerated     Mobility  Bed Mobility Overal bed mobility: Modified Independent                  Transfers Overall transfer level: Modified independent Equipment used: Rolling walker (2 wheels)                    Ambulation/Gait Ambulation/Gait assistance: Supervision Gait Distance (Feet): 220 Feet Assistive device: Rolling walker (2 wheels) Gait Pattern/deviations: Step-through pattern, Decreased step length - right, Decreased step length - left Gait velocity: decr     General Gait Details: patient ambulating well with RW, supervision  only. mildly increased pain this pm.   Stairs             Wheelchair Mobility     Tilt Bed    Modified Rankin (Stroke Patients Only)       Balance Overall balance assessment: Modified Independent                                          Communication Communication Communication: No apparent difficulties  Cognition Arousal: Alert Behavior During Therapy: WFL for tasks assessed/performed   PT - Cognitive impairments: No apparent impairments                         Following commands: Intact      Cueing Cueing Techniques: Verbal cues  Exercises      General Comments        Pertinent Vitals/Pain Pain Assessment Pain Assessment: Faces Faces Pain Scale: Hurts little more Pain Location: L LE Pain Descriptors / Indicators: Burning, Sore Pain Intervention(s): Monitored during session, Repositioned, Ice applied    Home Living                          Prior Function            PT Goals (current goals can now be found in the care plan section)  Acute Rehab PT Goals Patient Stated Goal: return home PT Goal Formulation: With patient/family Time For Goal Achievement: 10/21/23 Potential to Achieve Goals: Good Progress towards PT goals: Progressing toward goals    Frequency    BID      PT Plan      Co-evaluation              AM-PAC PT "6 Clicks" Mobility   Outcome Measure  Help needed turning from your back to your side while in a flat bed without using bedrails?: None Help needed moving from lying on your back to sitting on the side of a flat bed without using bedrails?: None Help needed moving to and from a bed to a chair (including a wheelchair)?: A Little Help needed standing up from a chair using your arms (e.g., wheelchair or bedside chair)?: A Little Help needed to walk in hospital room?: A Little Help needed climbing 3-5 steps with a railing? : A Little 6 Click Score: 20    End of Session    Activity Tolerance: Patient tolerated treatment well Patient left: in bed;with call bell/phone within reach;with bed alarm set Nurse Communication: Mobility status PT Visit Diagnosis: Muscle weakness (generalized) (M62.81);Pain;Difficulty in walking, not elsewhere classified (R26.2) Pain - Right/Left: Left Pain - part of body: Hip     Time: 2956-2130 PT Time Calculation (min) (ACUTE ONLY): 17 min  Charges:    $Gait Training: 8-22 mins PT General Charges $$ ACUTE PT VISIT: 1 Visit                     Hymen Arnett, PT, GCS 10/18/23,2:34 PM

## 2023-10-18 NOTE — Progress Notes (Signed)
 [employer] wound Vac not operational, unit replaced.

## 2023-10-18 NOTE — Plan of Care (Signed)
  Problem: Education: Goal: Knowledge of General Education information will improve Description: Including pain rating scale, medication(s)/side effects and non-pharmacologic comfort measures Outcome: Progressing   Problem: Health Behavior/Discharge Planning: Goal: Ability to manage health-related needs will improve Outcome: Progressing   Problem: Clinical Measurements: Goal: Ability to maintain clinical measurements within normal limits will improve Outcome: Progressing Goal: Will remain free from infection Outcome: Progressing Goal: Diagnostic test results will improve Outcome: Progressing Goal: Respiratory complications will improve Outcome: Progressing Goal: Cardiovascular complication will be avoided Outcome: Progressing   Problem: Activity: Goal: Risk for activity intolerance will decrease Outcome: Progressing   Problem: Nutrition: Goal: Adequate nutrition will be maintained Outcome: Progressing   Problem: Coping: Goal: Level of anxiety will decrease Outcome: Progressing   Problem: Elimination: Goal: Will not experience complications related to bowel motility Outcome: Progressing Goal: Will not experience complications related to urinary retention Outcome: Progressing   Problem: Pain Managment: Goal: General experience of comfort will improve and/or be controlled Outcome: Progressing   Problem: Skin Integrity: Goal: Risk for impaired skin integrity will decrease Outcome: Progressing   Problem: Safety: Goal: Ability to remain free from injury will improve Outcome: Progressing   Problem: Education: Goal: Knowledge of the prescribed therapeutic regimen will improve Outcome: Progressing Goal: Understanding of discharge needs will improve Outcome: Progressing Goal: Individualized Educational Video(s) Outcome: Progressing

## 2023-10-18 NOTE — Consult Note (Signed)
 NAME: Amber Barnes  DOB: 05/02/1951  MRN: [id number]  Date/Time: 10/18/2023 12:52 PM  REQUESTING PROVIDER: Dr.Amin Subjective:  REASON FOR CONSULT: Left hip PJI ? Amber Barnes is a 73 y.o. with a history of rt Ca breast, b/l TKA. Left THA, afib , cardiac ablationpresents to the ED with painful swelling left hip'pt underwent left hip replacement Oct 2024. She had left knee replacement in March 2025 She was doing fine until last Saturday. She was having PT when she developed pain left hip and leg and was unable to walk She did not have fever, may be some chills   10/15/23  BP 97/75  Temp 99 F (37.2 C)  Pulse Rate 64  Resp 18  SpO2 98 %    Latest Reference Range & Units 10/16/23  WBC 4.0 - 10.5 K/uL 5.5  Hemoglobin 12.0 - 15.0 g/dL 82.9 (L)  HCT 56.2 - 13.0 % 31.1 (L)  Platelets 150 - 400 K/uL 172  Creatinine 0.44 - 1.00 mg/dL 8.65  MRI left hip showed 7.5 x 3.4 x 6.2 cm within left proximal ant thigh Pt had aspiration of the fluid by IR and it showed 14K WBC (93% N) Culture sent She underwent I/D washout yesterday and I am seeing the patient for the PJI  Pt has some dysuria- not sure whether it started after foley or before But UA is not significant Past Medical History:  Diagnosis Date   Achilles tendinitis    Acute medial meniscal tear    Allergy    Anemia    Anxiety    a.) on BZO PRN (alprazolam)   Aortic atherosclerosis (HCC)    Ascending aorta dilatation (HCC) 10/20/2022   a.) TTE 10/20/2022: asc Ao 39 mm   Atrial fibrillation/flutter (HCC) 07/2016   a.) Dx'd 07/2016; b.) CHA2DS2-VASc = 5 (age, sex, HFmrEF, HTN, vasc disease history) as of 05/09/2023; c.) s/p DCCV 09/07/2016 (150 J x 2 --> A.flutter; 200 J x 1 --> NSR); d.) s/p DCCV 10/19/2016 (150 J x 1 --> NSR); e.) brought in for DCCV 12/2016; cancelled d/t NSR; f.) s/p PVI ablation 12/12/2022; g.) cardiac rate/rhythm maintained on oral bisoprolol; chronically anticoagulated using apixaban   BMI 50.0-59.9,  adult (HCC) 07/16/2018   CAD (coronary artery disease)    a.) cCTA 12/08/2022: Ca2+ = 31.6 (58th %'ile for age/sex/race match control)   Cardiomegaly    Chemotherapy induced nausea and vomiting 05/18/2021   Chronic heart failure with mid-range ejection fraction (HFmrEF) (HCC)    a.) TTE 03/08/21: EF 60-65, mild LVH, G1DD, mild LAE, triv MR; b.) TTE 07/29/21: EF 60-65, G1DD, mild LAE, triv MR; c.) TTE 11/26/21: EF 55-60, G1DD, mild LAE, mild MR; d.) TTE 02/25/22: EF 60-65, G1DD, mild LAE, mild MR; e.) TTE 05/31/22: EF 55-60, mild LVH, G2DD, mild-mod MR; f.) TTE 10/20/22: EF 45-50, mild LAE, mod red RVSF, mild-mod TR, Asc Ao 39; g.) TTE 12/13/22: EF 50-55, mild LVH, mild MAC   CKD (chronic kidney disease), stage II    Colon polyp    Complication of anesthesia    a.) emergence delirium; b.) hypoxia after hip replacement   COPD (chronic obstructive pulmonary disease) (HCC)    DDD (degenerative disc disease), lumbar    a.) s/p fusion   Depression    Endometriosis    Fibrocystic breast disease    GERD (gastroesophageal reflux disease)    History of kidney stones    History of stress test    a.) MV 07/29/2016: EF 55-65%. Small, mild  defect  in mid anteroseptal, apical septal, and apical locations. No ischemia-->low risk; b.) MV 10/2022: mod sized partially rev apical ant, apical septal, and apical defect. EF 52%. No signific cor Ca2+; c.) cCTA 12/08/2022: Ca2+ = 31.6 (58th %'ile).   History of tobacco use    Hyperlipidemia    Hypertension    Hypothyroidism    a.) s/p partial thyroidectomy for nodule; pathology benign   Insomnia    Left knee pain, unspecified chronicity    Lipoma    Malignant neoplasm of upper-outer quadrant of right female breast (HCC) 03/03/2021   a.) stage 1A invasive mammary carcinoma (cT1b or T1c N0); ER (-), PR (+), HER2/neu (+); did not tolerate adjuvant chemotherapy (paclitacel + trastuzumab) despite paclitaxel dose reduction; s/p adjuvant XRT; cinished maintenance trastuzumab  07/27/2022; no adjuvant endocrine therapy as no clear benefit   Migraine    Nephrolithiasis    On apixaban therapy    OSA (obstructive sleep apnea)    a.) non-compliant with nocturnal PAP therapy   Osteoarthritis    Osteopenia    Peptic ulcer disease    a.) H. pylori   Pericarditis 12/12/2022   a.) following A.fib ablation; resolved   Pneumonia    Port-A-Cath in place    RLS (restless legs syndrome)    a.) on ropinirole   Vitamin D deficiency     Past Surgical History:  Procedure Laterality Date   ABDOMINAL HYSTERECTOMY     ovaries left   ACHILLES TENDON SURGERY     2012,01/2017   ATRIAL FIBRILLATION ABLATION N/A 12/12/2022   Procedure: ATRIAL FIBRILLATION ABLATION;  Surgeon: Lanier Prude, MD;  [facility];  Service: Cardiovascular;  Laterality: N/A;   BREAST BIOPSY Right 01/15/2021   Affirm biopsy-"X" clip-INVASIVE MAMMARY CARCINOMA   BREAST BIOPSY Right 01/15/2021   u/s bx-"venus" clip INVASIVE MAMMARY CARCINOMA   BREAST BIOPSY     BREAST BIOPSY Right 01/04/2023   u/s bx ribbon clip path pending   BREAST BIOPSY Right 01/04/2023   [place] RT BREAST BX W LOC DEV 1ST LESION IMG BX SPEC US GUIDE 01/04/2023 [facility]   BREAST LUMPECTOMY,RADIO FREQ LOCALIZER,AXILLARY SENTINEL LYMPH NODE BIOPSY Right 02/17/2021   Procedure: BREAST LUMPECTOMY,RADIO FREQ LOCALIZER,AXILLARY SENTINEL LYMPH NODE BIOPSY;  Surgeon: Campbell Lerner, MD;  Location: [facility];  Service: General;  Laterality: Right;   CARDIOVERSION N/A 09/07/2016   Procedure: CARDIOVERSION;  Surgeon: Antonieta Iba, MD;  Location: [facility];  Service: Cardiovascular;  Laterality: N/A;   CARDIOVERSION N/A 10/19/2016   Procedure: CARDIOVERSION;  Surgeon: Antonieta Iba, MD;  Location: [facility];  Service: Cardiovascular;  Laterality: N/A;   CARDIOVERSION N/A 12/14/2016   Procedure: CARDIOVERSION;  Surgeon: Antonieta Iba, MD;  Location: [facility];  Service: Cardiovascular;  Laterality: N/A;    COLONOSCOPY     COLONOSCOPY WITH PROPOFOL N/A 05/13/2020   Procedure: COLONOSCOPY WITH PROPOFOL;  Surgeon: Toledo, Boykin Nearing, MD;  Location: [facility];  Service: Gastroenterology;  Laterality: N/A;  ELIQUIS   CYSTOSCOPY WITH STENT PLACEMENT Bilateral 07/12/2021   Procedure: CYSTOSCOPY WITH STENT PLACEMENT;  Surgeon: Sondra Come, MD;  Location: [facility];  Service: Urology;  Laterality: Bilateral;   CYSTOSCOPY/URETEROSCOPY/HOLMIUM LASER/STENT PLACEMENT Bilateral 07/30/2021   Procedure: CYSTOSCOPY/URETEROSCOPY/HOLMIUM LASER/BILATERAL STENT REMOVAL;  Surgeon: Sondra Come, MD;  Location: [facility];  Service: Urology;  Laterality: Bilateral;   ESOPHAGOGASTRODUODENOSCOPY     ESOPHAGOGASTRODUODENOSCOPY (EGD) WITH PROPOFOL N/A 05/13/2020   Procedure: ESOPHAGOGASTRODUODENOSCOPY (EGD) WITH PROPOFOL;  Surgeon: Geistown, Melia,  MD;  Location: [facility];  Service: Gastroenterology;  Laterality: N/A;   FOOT SURGERY Left    tendon   LUMBAR FUSION     PORTA CATH INSERTION N/A 03/23/2021   Procedure: PORTA CATH INSERTION;  Surgeon: Renford Dills, MD;  Location: [facility];  Service: Cardiovascular;  Laterality: N/A;   PORTACATH PLACEMENT N/A 03/23/2021   Procedure: ATTEMPTED INSERTION PORT-A-CATH;  Surgeon: Campbell Lerner, MD;  Location: [facility];  Service: General;  Laterality: N/A;   THYROIDECTOMY  1990   half thyroid lobectomy secondary to a benign nodule   TOTAL HIP ARTHROPLASTY Right 08/21/2018   Procedure: TOTAL HIP ARTHROPLASTY ANTERIOR APPROACH-RIGHT CELL SAVER REQUESTED;  Surgeon: Kennedy Bucker, MD;  Location: [facility];  Service: Orthopedics;  Laterality: Right;   TOTAL HIP ARTHROPLASTY Left 05/11/2023   Procedure: TOTAL HIP ARTHROPLASTY ANTERIOR APPROACH;  Surgeon: Reinaldo Berber, MD;  Location: [facility];  Service: Orthopedics;  Laterality: Left;   TOTAL KNEE ARTHROPLASTY Left 09/11/2023   Procedure: ARTHROPLASTY, KNEE, TOTAL;  Surgeon: Reinaldo Berber, MD;   Location: [facility];  Service: Orthopedics;  Laterality: Left;    Social History   Socioeconomic History   Marital status: Married    Spouse name: jeffrey   Number of children: 1   Years of education: Not on file   Highest education level: 12th grade  Occupational History    Comment: disabled  Tobacco Use   Smoking status: Former    Current packs/day: 0.00    Average packs/day: 0.3 packs/day for 10.0 years (2.5 ttl pk-yrs)    Types: Cigarettes    Start date: 88    Quit date: 2005    Years since quitting: 20.2    Passive exposure: Past   Smokeless tobacco: Never   Tobacco comments:    smoked for 10 years, 1 pack every 2-3 days  Vaping Use   Vaping status: Never Used  Substance and Sexual Activity   Alcohol use: Yes    Alcohol/week: 1.0 standard drink of alcohol    Types: 1 Glasses of wine per week    Comment: margarita qd   Drug use: Yes    Types: Marijuana   Sexual activity: Not Currently  Other Topics Concern   Not on file  Social History Narrative   Moved to [place] from [place], originally from [place].    1 cup of caffeine daily   Right handed   Lives with husband, step-daughter and child       Social Drivers of Health   Financial Resource Strain: Low Risk  (08/23/2023)   Received from [facility]   Overall Financial Resource Strain (CARDIA)    Difficulty of Paying Living Expenses: Not very hard  Food Insecurity: No Food Insecurity (10/16/2023)   Hunger Vital Sign    Worried About Running Out of Food in the Last Year: Never true    Ran Out of Food in the Last Year: Never true  Transportation Needs: No Transportation Needs (10/16/2023)   PRAPARE - Administrator, Civil Service (Medical): No    Lack of Transportation (Non-Medical): No  Physical Activity: Insufficiently Active (07/14/2023)   Exercise Vital Sign    Days of Exercise per Week: 4 days    Minutes of Exercise per Session: 20 min  Stress: Stress  Concern Present (07/14/2023)   [employer] of Occupational Health - Occupational Stress Questionnaire    Feeling of Stress : To some extent  Social Connections: Socially  Integrated (10/16/2023)   Social Connection and Isolation Panel [NHANES]    Frequency of Communication with Friends and Family: More than three times a week    Frequency of Social Gatherings with Friends and Family: More than three times a week    Attends Religious Services: More than 4 times per year    Active Member of [employer] or Organizations: Yes    Attends Engineer, structural: More than 4 times per year    Marital Status: Married  Catering manager Violence: Not At Risk (10/16/2023)   Humiliation, Afraid, Rape, and Kick questionnaire    Fear of Current or Ex-Partner: No    Emotionally Abused: No    Physically Abused: No    Sexually Abused: No    Family History  Problem Relation Age of Onset   Cancer Mother        leukemia   Cancer Father 29       colon   Heart disease Father    Heart attack Father 73       died from MI   Hyperlipidemia Father    Hypertension Father    Cancer Sister 35       ovarian   Depression Sister    Cancer Brother        CLL   Breast cancer Paternal Aunt        40's   Migraines Neg Hx    Thyroid cancer Neg Hx    Allergies  Allergen Reactions   Hydrocodone    Prednisone Other (See Comments)    Agitation, hyperactivity, polyphagia   Amiodarone Nausea And Vomiting and Other (See Comments)   Hydrocodone-Acetaminophen Other (See Comments)    Hallucinations and combative   Tapentadol Itching   Adhesive [Tape] Itching and Rash    Prefers paper tape   Latex Itching and Rash    use paper tap   Oxycodone Itching    Can take it with benadryl   Tramadol Itching    Can take it with benadryl   Wound Dressing Adhesive    I? Current Facility-Administered Medications  Medication Dose Route Frequency Provider Last Rate Last Admin   0.9 %  sodium chloride infusion    Intravenous Continuous Reinaldo Berber, MD 100 mL/hr at 10/18/23 0549 New Bag at 10/18/23 0549   acetaminophen (TYLENOL) tablet 1,000 mg  1,000 mg Oral Q8H Mikey College T, MD   1,000 mg at 10/18/23 9604   ALPRAZolam Prudy Feeler) tablet 0.25 mg  0.25 mg Oral BID PRN Mikey College T, MD   0.25 mg at 10/17/23 2214   apixaban (ELIQUIS) tablet 5 mg  5 mg Oral BID Reinaldo Berber, MD   5 mg at 10/18/23 0852   bisoprolol (ZEBETA) tablet 10 mg  10 mg Oral BID Mikey College T, MD   10 mg at 10/18/23 0856   cefTRIAXone (ROCEPHIN) 2 g in sodium chloride 0.9 % 100 mL IVPB  2 g Intravenous Q24H Arnetha Courser, MD 200 mL/hr at 10/18/23 1222 2 g at 10/18/23 1222   diphenhydrAMINE (BENADRYL) capsule 25 mg  25 mg Oral Q6H PRN Mansy, Jan A, MD   25 mg at 10/18/23 0911   docusate sodium (COLACE) capsule 100 mg  100 mg Oral BID Reinaldo Berber, MD   100 mg at 10/18/23 0853   escitalopram (LEXAPRO) tablet 10 mg  10 mg Oral Daily Mikey College T, MD   10 mg at 10/18/23 0852   Fe Fum-Vit C-Vit B12-FA (TRIGELS-F FORTE) capsule 1 capsule  1 capsule Oral BID Arnetha Courser, MD   1 capsule at 10/18/23 1005   furosemide (LASIX) tablet 20 mg  20 mg Oral Daily Mikey College T, MD   20 mg at 10/18/23 8295   hydrochlorothiazide (HYDRODIURIL) tablet 6.25 mg  6.25 mg Oral Daily Madueme, Elvira C, RPH   6.25 mg at 10/18/23 0857   And   losartan (COZAAR) tablet 25 mg  25 mg Oral Daily Madueme, Elvira C, RPH   25 mg at 10/18/23 6213   HYDROmorphone (DILAUDID) injection 0.5 mg  0.5 mg Intravenous Q4H PRN Mikey College T, MD   0.5 mg at 10/18/23 0539   menthol-cetylpyridinium (CEPACOL) lozenge 3 mg  1 lozenge Oral PRN Reinaldo Berber, MD       Or   phenol (CHLORASEPTIC) mouth spray 1 spray  1 spray Mouth/Throat PRN Reinaldo Berber, MD       metoCLOPramide (REGLAN) tablet 5-10 mg  5-10 mg Oral Q8H PRN Reinaldo Berber, MD       Or   metoCLOPramide (REGLAN) injection 5-10 mg  5-10 mg Intravenous Q8H PRN Reinaldo Berber, MD       ondansetron  (ZOFRAN) tablet 4 mg  4 mg Oral Q6H PRN Mikey College T, MD       pantoprazole (PROTONIX) EC tablet 40 mg  40 mg Oral Daily Reinaldo Berber, MD   40 mg at 10/18/23 0853   potassium chloride SA (KLOR-CON M) CR tablet 20 mEq  20 mEq Oral Daily Mikey College T, MD   20 mEq at 10/18/23 0851   ranolazine (RANEXA) 12 hr tablet 500 mg  500 mg Oral BID Mikey College T, MD   500 mg at 10/18/23 0856   rOPINIRole (REQUIP) tablet 2 mg  2 mg Oral QHS Mikey College T, MD   2 mg at 10/17/23 2142   rosuvastatin (CRESTOR) tablet 40 mg  40 mg Oral Daily Mikey College T, MD   40 mg at 10/18/23 0851   traMADol (ULTRAM) tablet 50 mg  50 mg Oral Q6H PRN Evon Slack, PA-C   50 mg at 10/18/23 0865     Abtx:  Anti-infectives (From admission, onward)    Start     Dose/Rate Route Frequency Ordered Stop   10/18/23 1200  cefTRIAXone (ROCEPHIN) 2 g in sodium chloride 0.9 % 100 mL IVPB        2 g 200 mL/hr over 30 Minutes Intravenous Every 24 hours 10/18/23 1112     10/17/23 2200  ceFAZolin (ANCEF) IVPB 2g/100 mL premix  Status:  Discontinued        2 g 200 mL/hr over 30 Minutes Intravenous Every 8 hours 10/17/23 2017 10/18/23 1112   10/17/23 1654  tobramycin (NEBCIN) powder  Status:  Discontinued          As needed 10/17/23 1654 10/17/23 1830   10/17/23 1654  vancomycin (VANCOCIN) powder  Status:  Discontinued          As needed 10/17/23 1654 10/17/23 1830   10/17/23 1545  ceFAZolin (ANCEF) IVPB 2g/100 mL premix        2 g 200 mL/hr over 30 Minutes Intravenous  Once 10/17/23 1531 10/17/23 1703   10/17/23 1545  vancomycin (VANCOCIN) IVPB 1000 mg/200 mL premix        1,000 mg 200 mL/hr over 60 Minutes Intravenous  Once 10/17/23 1531 10/17/23 1756       REVIEW OF SYSTEMS:  Const: negative fever, negative chills, negative weight loss Eyes: negative  diplopia or visual changes, negative eye pain ENT: negative coryza, negative sore throat Resp: negative cough, hemoptysis, dyspnea Cards: negative for chest pain,  palpitations, lower extremity edema GU: dysuria GI: Negative for abdominal pain, diarrhea, bleeding, constipation Skin: negative for rash and pruritus Heme: negative for easy bruising and gum/nose bleeding MS:  as above Neurolo:negative for headaches, dizziness, vertigo, memory problems  Psych: negative for feelings of anxiety, depression  Endocrine: negative for thyroid, diabetes Allergy/Immunology- negative for any medication or food allergies ?  Objective:  VITALS:  BP 137/62 (BP Location: Left Arm)   Pulse (!) 49   Temp (!) 97.5 F (36.4 C) (Oral)   Resp 16   Ht 5\' 5"  (1.651 m)   Wt 108 kg   SpO2 99%   BMI 39.61 kg/m   PHYSICAL EXAM:  General: Alert, cooperative, no distress, appears stated age.  Head: Normocephalic, without obvious abnormality, atraumatic. Eyes: Conjunctivae clear, anicteric sclerae. Pupils are equal ENT Nares normal. No drainage or sinus tenderness. Lips, mucosa, and tongue normal. No Thrush Neck: Supple, symmetrical, no adenopathy, thyroid: non tender no carotid bruit and no JVD. PORT left side of the chest Lungs: Clear to auscultation bilaterally. No Wheezing or Rhonchi. No rales. Heart: Regular rate and rhythm, no murmur, rub or gallop. Abdomen: Soft, non-tender,not distended. Bowel sounds normal. No masses Extremities: left thigh- wound vac and drain Left knee scar healthy Skin: No rashes or lesions. Or bruising Lymph: Cervical, supraclavicular normal. Neurologic: Grossly non-focal Pertinent Labs Lab Results CBC    Component Value Date/Time   WBC 7.4 10/18/2023 0446   RBC 2.93 (L) 10/18/2023 0446   HGB 9.0 (L) 10/18/2023 0446   HGB 13.2 08/17/2023 1037   HGB 12.9 10/11/2016 0843   HCT 25.9 (L) 10/18/2023 0446   HCT 37.7 10/11/2016 0843   PLT 209 10/18/2023 0446   PLT 152 08/17/2023 1037   PLT 227 10/11/2016 0843   MCV 88.4 10/18/2023 0446   MCV 86 10/11/2016 0843   MCH 30.7 10/18/2023 0446   MCHC 34.7 10/18/2023 0446   RDW 12.6  10/18/2023 0446   RDW 14.8 10/11/2016 0843   LYMPHSABS 0.9 10/16/2023 0107   LYMPHSABS 1.6 10/11/2016 0843   MONOABS 0.5 10/16/2023 0107   EOSABS 0.0 10/16/2023 0107   EOSABS 0.1 10/11/2016 0843   BASOSABS 0.0 10/16/2023 0107   BASOSABS 0.0 10/11/2016 0843       Latest Ref Rng & Units 10/18/2023    4:46 AM 10/16/2023    1:07 AM 09/12/2023    5:56 AM  CMP  Glucose 70 - 99 mg/dL 97  96  409   BUN 8 - 23 mg/dL 15  18  23    Creatinine 0.44 - 1.00 mg/dL 8.11  9.14  7.82   Sodium 135 - 145 mmol/L 136  135  135   Potassium 3.5 - 5.1 mmol/L 4.3  4.0  4.2   Chloride 98 - 111 mmol/L 105  103  105   CO2 22 - 32 mmol/L 25  23  23    Calcium 8.9 - 10.3 mg/dL 7.8  8.2  8.3       Microbiology: Recent Results (from the past 240 hours)  Aerobic/Anaerobic Culture w Gram Stain (surgical/deep wound)     Status: None (Preliminary result)   Collection Time: 10/17/23 10:20 AM   Specimen: Joint, Hip; Fine Needle Aspirate  Result Value Ref Range Status   Specimen Description   Final    HIP LEFT Performed at [facility], [address], [place], [place] [address]    Special Requests   Final    Normal Performed at [facility], [address] [place], [place] [address]    Gram Stain   Final    ABUNDANT WBC PRESENT, PREDOMINANTLY PMN NO ORGANISMS SEEN    Culture   Final    CULTURE REINCUBATED FOR BETTER GROWTH Performed at [facility], [address], [place], [place] [address]    Report Status PENDING  Incomplete  Aerobic/Anaerobic Culture w Gram Stain (surgical/deep wound)     Status: None (Preliminary result)   Collection Time: 10/17/23  4:37 PM   Specimen: Path Tissue  Result Value Ref Range Status   Specimen Description   Final    TISSUE Performed at [facility], [address] [place], [place] [address]    Special Requests   Final    left hip joint Performed at [facility], [address] [place], [place] [address]    Gram Stain    Final    RARE WBC PRESENT, PREDOMINANTLY PMN NO ORGANISMS SEEN    Culture   Final    NO GROWTH < 24 HOURS Performed at [facility], [address] [place], [place] [address]    Report Status PENDING  Incomplete  Aerobic/Anaerobic Culture w Gram Stain (surgical/deep wound)     Status: None (Preliminary result)   Collection Time: 10/17/23  4:39 PM   Specimen: Path Tissue  Result Value Ref Range Status   Specimen Description   Final    TISSUE Performed at [facility], [address] [place], [place] [address]    Special Requests   Final    left hip joint 2 Performed at [facility], [address] [place], [place] [address]    Gram Stain   Final    RARE WBC PRESENT, PREDOMINANTLY PMN NO ORGANISMS SEEN    Culture   Final    NO GROWTH < 24 HOURS Performed at [facility], [address] [place], [place] [address]    Report Status PENDING  Incomplete  Aerobic/Anaerobic Culture w Gram Stain (surgical/deep wound)     Status: None (Preliminary result)   Collection Time: 10/17/23  4:43 PM   Specimen: PATH Soft tissue  Result Value Ref Range Status   Specimen Description   Final    TISSUE Performed at [facility], [address], [place], [place] [address]    Special Requests   Final    LEFT HIP SYNOVIUM AND CAPSULE Performed at [facility], [address] [place], [place] [address]    Gram Stain NO WBC SEEN NO ORGANISMS SEEN   Final   Culture   Final    NO GROWTH < 24 HOURS Performed at [facility], [address], [place], [place] [address]    Report Status PENDING  Incomplete  Aerobic/Anaerobic Culture w Gram Stain (surgical/deep wound)     Status: None (Preliminary result)   Collection Time: 10/17/23  4:47 PM   Specimen: PATH Soft tissue  Result Value Ref Range Status   Specimen Description   Final    TISSUE Performed at [facility], [address] [place], [place] [address]    Special  Requests   Final    LEFT HIPSYNOVIUM AND CAPSULE Performed at [facility], [address], [place], [place] [address]    Gram Stain   Final  RARE WBC PRESENT, PREDOMINANTLY PMN NO ORGANISMS SEEN    Culture   Final    NO GROWTH < 24 HOURS Performed at [facility], [address] [place], [place] [address]    Report Status PENDING  Incomplete    IMAGING RESULTS: MRI left thigh Collection seen I have personally reviewed the films ? Impression/Recommendation ? Left hip prosthetic joint infection- S/p irrigation and debridement with head and liner hip revision Cultures sent Pt is currently on ceftriaxone Await culture  May have to add MRSA coverage  Minimal dysuria-UA minimal wbc but more RBC -so likely irritation from foley- UC not helpful in this situation Foley has been removed  Recent left TKA- site is healthy  H/o ca breast s/p surgery, immune therapy and radiation  Port present  H/o afib on amoidarone Cardiac ablation    ? _I have personally spent  -60--minutes involved in face-to-face and non-face-to-face activities for this patient on the day of the visit. Professional time spent includes the following activities: Preparing to see the patient (review of tests), Obtaining and/or reviewing separately obtained history (admission/discharge record), Performing a medically appropriate examination and/or evaluation , Ordering medications/tests/procedures, referring and communicating with other health care professionals, Documenting clinical information in the EMR, Independently interpreting results (not separately reported), Communicating results to the patient/family/caregiver, Counseling and educating the patient/family/caregiver and Care coordination (not separately reported).    ________________________________________________

## 2023-10-18 NOTE — Evaluation (Signed)
 Physical Therapy Evaluation Patient Details Name: Amber Barnes MRN: [id number] DOB: 09/27/1950 Today's Date: 10/18/2023  History of Present Illness  Pt is a 73 yo F admitted with increased pain in L hip, she has history of recent  L TKA 1 month ago, L THA in 2024. Now s/p L THA with antibiotic beads. PMH includes BLE THA, COPD, HTN, A-fib, and anxiety.  Of note pt reports history of cardiac ablation procedure with baseline HR of 50-60 bpm.  Clinical Impression  Patient received in bed. She is agreeable to PT assessment. Patient reports mild pain. She is mod I with bed mobility and transfers. Ambulated 125 feet with RW and supervision. Assist needed to manage lines. Patient reports L thigh cramping after ambulation. She is moving well. Patient will continue to benefit from skilled PT to improve functional independence.           If plan is discharge home, recommend the following: A little help with walking and/or transfers;A little help with bathing/dressing/bathroom   Can travel by private vehicle    yes    Equipment Recommendations None recommended by PT  Recommendations for Other Services       Functional Status Assessment Patient has had a recent decline in their functional status and demonstrates the ability to make significant improvements in function in a reasonable and predictable amount of time.     Precautions / Restrictions Precautions Precautions: Fall;Anterior Hip Recall of Precautions/Restrictions: Intact Restrictions Weight Bearing Restrictions Per Provider Order: Yes LLE Weight Bearing Per Provider Order: Weight bearing as tolerated      Mobility  Bed Mobility Overal bed mobility: Modified Independent                  Transfers Overall transfer level: Modified independent Equipment used: Rolling walker (2 wheels)                    Ambulation/Gait Ambulation/Gait assistance: Supervision Gait Distance (Feet): 125 Feet Assistive device:  Rolling walker (2 wheels) Gait Pattern/deviations: Step-through pattern, Decreased step length - right, Decreased step length - left Gait velocity: decr     General Gait Details: patient ambulating well with RW, supervision only. Reports cramping in L thigh at end of walk.  Stairs            Wheelchair Mobility     Tilt Bed    Modified Rankin (Stroke Patients Only)       Balance Overall balance assessment: Modified Independent                                           Pertinent Vitals/Pain Pain Assessment Pain Assessment: Faces Faces Pain Scale: Hurts little more Pain Location: L LE Pain Descriptors / Indicators: Cramping Pain Intervention(s): Monitored during session, Repositioned    Home Living Family/patient expects to be discharged to:: Private residence Living Arrangements: Spouse/significant other Available Help at Discharge: Family;Available 24 hours/day Type of Home: Apartment Home Access: Level entry       Home Layout: One level Home Equipment: Agricultural consultant (2 wheels);Rollator (4 wheels);BSC/3in1;Wheelchair - power;Cane - single point Additional Comments: reacher/sock aid; pt report family can assist as needed    Prior Function Prior Level of Function : Independent/Modified Independent;Driving             Mobility Comments: was just finishing OPPT for TKA ADLs Comments: MOD I-I in ADL/IADL  Extremity/Trunk Assessment   Upper Extremity Assessment Upper Extremity Assessment: Overall WFL for tasks assessed    Lower Extremity Assessment Lower Extremity Assessment: LLE deficits/detail LLE Coordination: decreased gross motor    Cervical / Trunk Assessment Cervical / Trunk Assessment: Normal  Communication   Communication Communication: No apparent difficulties    Cognition Arousal: Alert Behavior During Therapy: WFL for tasks assessed/performed   PT - Cognitive impairments: No apparent impairments                          Following commands: Intact       Cueing Cueing Techniques: Verbal cues     General Comments      Exercises     Assessment/Plan    PT Assessment Patient needs continued PT services  PT Problem List Decreased strength;Decreased activity tolerance;Decreased mobility;Obesity;Pain       PT Treatment Interventions DME instruction;Gait training;Functional mobility training;Therapeutic activities;Therapeutic exercise;Balance training;Neuromuscular re-education;Patient/family education    PT Goals (Current goals can be found in the Care Plan section)  Acute Rehab PT Goals Patient Stated Goal: return home PT Goal Formulation: With patient/family Time For Goal Achievement: 10/21/23 Potential to Achieve Goals: Good    Frequency BID     Co-evaluation               AM-PAC PT "6 Clicks" Mobility  Outcome Measure Help needed turning from your back to your side while in a flat bed without using bedrails?: None Help needed moving from lying on your back to sitting on the side of a flat bed without using bedrails?: None Help needed moving to and from a bed to a chair (including a wheelchair)?: A Little Help needed standing up from a chair using your arms (e.g., wheelchair or bedside chair)?: A Little Help needed to walk in hospital room?: A Little Help needed climbing 3-5 steps with a railing? : A Little 6 Click Score: 20    End of Session   Activity Tolerance: Patient tolerated treatment well Patient left: in chair;with call bell/phone within reach;with chair alarm set Nurse Communication: Mobility status PT Visit Diagnosis: Muscle weakness (generalized) (M62.81);Pain;Difficulty in walking, not elsewhere classified (R26.2) Pain - Right/Left: Left Pain - part of body: Leg    Time: 1610-9604 PT Time Calculation (min) (ACUTE ONLY): 32 min   Charges:   PT Evaluation $PT Eval Moderate Complexity: 1 Mod PT Treatments $Gait Training: 8-22 mins PT  General Charges $$ ACUTE PT VISIT: 1 Visit         Ethon Wymer, PT, GCS 10/18/23,9:53 AM

## 2023-10-18 NOTE — Progress Notes (Signed)
 Subjective: 1 Day Post-Op Procedure(s) (LRB): EXCISIONAL TOTAL HIP ARTHROPLASTY WITH PLACEMENT OF ANTIBIOTIC BEADS AND DRAIN (Left) Patient reports pain as mild.   Patient is well, and has had no acute complaints or problems Denies any CP, SOB, ABD pain. We will continue therapy today.   Objective: Vital signs in last 24 hours: Temp:  [97.2 F (36.2 C)-98 F (36.7 C)] 98 F (36.7 C) (04/09 0529) Pulse Rate:  [53-77] 56 (04/09 0529) Resp:  [10-18] 16 (04/08 2100) BP: (90-192)/(51-80) 132/61 (04/09 0529) SpO2:  [94 %-100 %] 100 % (04/09 0529)  Intake/Output from previous day: 04/08 0701 - 04/09 0700 In: 1788.3 [I.V.:1688.3; IV Piggyback:100] Out: 2100 [Urine:1810; Drains:90; Blood:200] Intake/Output this shift: No intake/output data recorded.  Recent Labs    10/16/23 0107 10/18/23 0446  HGB 10.1* 9.0*   Recent Labs    10/16/23 0107 10/18/23 0446  WBC 5.5 7.4  RBC 3.29* 2.93*  HCT 31.1* 25.9*  PLT 172 209   Recent Labs    10/16/23 0107 10/18/23 0446  NA 135 136  K 4.0 4.3  CL 103 105  CO2 23 25  BUN 18 15  CREATININE 0.86 0.85  GLUCOSE 96 97  CALCIUM 8.2* 7.8*   No results for input(s): "LABPT", "INR" in the last 72 hours.  EXAM General - Patient is Alert, Appropriate, and Oriented Extremity - Neurovascular intact Sensation intact distally Intact pulses distally Dorsiflexion/Plantar flexion intact No cellulitis present Compartment soft Dressing - dressing C/D/I and no drainage in provena, hemovac with drainage present. Motor Function - intact, moving foot and toes well on exam.   Past Medical History:  Diagnosis Date   Achilles tendinitis    Acute medial meniscal tear    Allergy    Anemia    Anxiety    a.) on BZO PRN (alprazolam)   Aortic atherosclerosis (HCC)    Ascending aorta dilatation (HCC) 10/20/2022   a.) TTE 10/20/2022: asc Ao 39 mm   Atrial fibrillation/flutter (HCC) 07/2016   a.) Dx'd 07/2016; b.) CHA2DS2-VASc = 5 (age, sex,  HFmrEF, HTN, vasc disease history) as of 05/09/2023; c.) s/p DCCV 09/07/2016 (150 J x 2 --> A.flutter; 200 J x 1 --> NSR); d.) s/p DCCV 10/19/2016 (150 J x 1 --> NSR); e.) brought in for DCCV 12/2016; cancelled d/t NSR; f.) s/p PVI ablation 12/12/2022; g.) cardiac rate/rhythm maintained on oral bisoprolol; chronically anticoagulated using apixaban   BMI 50.0-59.9, adult (HCC) 07/16/2018   CAD (coronary artery disease)    a.) cCTA 12/08/2022: Ca2+ = 31.6 (58th %'ile for age/sex/race match control)   Cardiomegaly    Chemotherapy induced nausea and vomiting 05/18/2021   Chronic heart failure with mid-range ejection fraction (HFmrEF) (HCC)    a.) TTE 03/08/21: EF 60-65, mild LVH, G1DD, mild LAE, triv MR; b.) TTE 07/29/21: EF 60-65, G1DD, mild LAE, triv MR; c.) TTE 11/26/21: EF 55-60, G1DD, mild LAE, mild MR; d.) TTE 02/25/22: EF 60-65, G1DD, mild LAE, mild MR; e.) TTE 05/31/22: EF 55-60, mild LVH, G2DD, mild-mod MR; f.) TTE 10/20/22: EF 45-50, mild LAE, mod red RVSF, mild-mod TR, Asc Ao 39; g.) TTE 12/13/22: EF 50-55, mild LVH, mild MAC   CKD (chronic kidney disease), stage II    Colon polyp    Complication of anesthesia    a.) emergence delirium; b.) hypoxia after hip replacement   COPD (chronic obstructive pulmonary disease) (HCC)    DDD (degenerative disc disease), lumbar    a.) s/p fusion   Depression  Endometriosis    Fibrocystic breast disease    GERD (gastroesophageal reflux disease)    History of kidney stones    History of stress test    a.) MV 07/29/2016: EF 55-65%. Small, mild defect  in mid anteroseptal, apical septal, and apical locations. No ischemia-->low risk; b.) MV 10/2022: mod sized partially rev apical ant, apical septal, and apical defect. EF 52%. No signific cor Ca2+; c.) cCTA 12/08/2022: Ca2+ = 31.6 (58th %'ile).   History of tobacco use    Hyperlipidemia    Hypertension    Hypothyroidism    a.) s/p partial thyroidectomy for nodule; pathology benign   Insomnia    Left knee  pain, unspecified chronicity    Lipoma    Malignant neoplasm of upper-outer quadrant of right female breast (HCC) 03/03/2021   a.) stage 1A invasive mammary carcinoma (cT1b or T1c N0); ER (-), PR (+), HER2/neu (+); did not tolerate adjuvant chemotherapy (paclitacel + trastuzumab) despite paclitaxel dose reduction; s/p adjuvant XRT; cinished maintenance trastuzumab 07/27/2022; no adjuvant endocrine therapy as no clear benefit   Migraine    Nephrolithiasis    On apixaban therapy    OSA (obstructive sleep apnea)    a.) non-compliant with nocturnal PAP therapy   Osteoarthritis    Osteopenia    Peptic ulcer disease    a.) H. pylori   Pericarditis 12/12/2022   a.) following A.fib ablation; resolved   Pneumonia    Port-A-Cath in place    RLS (restless legs syndrome)    a.) on ropinirole   Vitamin D deficiency     Assessment/Plan:   1 Day Post-Op Procedure(s) (LRB): EXCISIONAL TOTAL HIP ARTHROPLASTY WITH PLACEMENT OF ANTIBIOTIC BEADS AND DRAIN (Left) Principal Problem:   Left hip pain Active Problems:   Hypertension   Depression   Atrial fibrillation/flutter (HCC)   RLS (restless legs syndrome)   Hyperlipidemia   Chronic heart failure with preserved ejection fraction (HFpEF) (HCC)   Intractable pain   Lumbar radiculitis   Unable to ambulate  Estimated body mass index is 39.61 kg/m as calculated from the following:   Height as of this encounter: 5\' 5"  (1.651 m).   Weight as of this encounter: 108 kg. Advance diet Up with therapy Pain well controlled Labs and VSS ID consult and cultures pending.  Continue with IV abx Remove foley catheter this am Monitor hemovac output, may remove hemovac tomorrow   DVT Prophylaxis - TED hose and SCDS Eliquis Weight-Bearing as tolerated to left leg   T. Cranston Neighbor, PA-C [facility] 10/18/2023, 8:19 AM

## 2023-10-18 NOTE — TOC Initial Note (Signed)
 Transition of Care [facility]) - Initial/Assessment Note    Patient Details  Name: Amber Barnes MRN: [id number] Date of Birth: 07-06-1951  Transition of Care [facility]) CM/SW Contact:    Marlowe Sax, RN Phone Number: 10/18/2023, 2:20 PM  Clinical Narrative:                 Patient from home where she is independent, has DME at home [employer] (2 wheels);Rollator (4 wheels);BSC/3in1;Wheelchair - power;Cane - single point Additional Comments: reacher/sock aid; pt report family can assist as needed   Expected Discharge Plan: OP Rehab Barriers to Discharge: Barriers Resolved   Patient Goals and CMS Choice            Expected Discharge Plan and Services   Discharge Planning Services: CM Consult   Living arrangements for the past 2 months: Single Family Home                 DME Arranged: N/A DME Agency: NA Date DME Agency Contacted: 10/18/23     HH Arranged: NA          Prior Living Arrangements/Services Living arrangements for the past 2 months: Single Family Home Lives with:: Spouse Patient language and need for interpreter reviewed:: Yes Do you feel safe going back to the place where you live?: Yes      Need for Family Participation in Patient Care: Yes (Comment)   Current home services: DME Adult nurse (2 wheels);Rollator (4 wheels);BSC/3in1;Wheelchair - power;Cane - single point  Additional Comments: reacher/sock aid; pt report family can assist as needed) Criminal Activity/Legal Involvement Pertinent to Current Situation/Hospitalization: No - Comment as needed  Activities of Daily Living   ADL Screening (condition at time of admission) Independently performs ADLs?: Yes (appropriate for developmental age) Is the patient deaf or have difficulty hearing?: No Does the patient have difficulty seeing, even when wearing glasses/contacts?: No Does the patient have difficulty concentrating, remembering, or making decisions?: No  Permission Sought/Granted    Permission granted to share information with : Yes, Verbal Permission Granted              Emotional Assessment Appearance:: Appears stated age Attitude/Demeanor/Rapport: Engaged Affect (typically observed): Pleasant Orientation: : Oriented to Self, Oriented to Place, Oriented to  Time, Oriented to Situation Alcohol / Substance Use: Not Applicable Psych Involvement: No (comment)  Admission diagnosis:  Left hip pain [M25.552] Lumbar radiculitis [M54.16] Intractable pain [R52] Unable to ambulate [R26.2] Patient Active Problem List   Diagnosis Date Noted   Dysuria 10/18/2023   Acute postoperative anemia due to expected blood loss 10/18/2023   Intractable pain 10/17/2023   Lumbar radiculitis 10/17/2023   Unable to ambulate 10/17/2023   Insomnia    S/P TKR (total knee replacement), left 09/11/2023   Hip mass, left 07/18/2023   S/P total left hip arthroplasty 05/11/2023   Breast mass, right 12/13/2022   Myocardial injury 12/13/2022   Peptic ulcer 12/13/2022   Obesity, Class III, BMI 40-49.9 (morbid obesity) (HCC) 12/13/2022   PAF (paroxysmal atrial fibrillation) (HCC) 12/13/2022   Pericarditis 12/13/2022   Elevated TSH 09/12/2022   Basal cell carcinoma 08/10/2022   Hypocalcemia 03/23/2022   Primary localized osteoarthritis of pelvic region and thigh 03/02/2022   Displacement of lumbar intervertebral disc without myelopathy 03/02/2022   Left hip pain 01/05/2022   Dizziness 12/10/2021   Chronic pain of left knee 11/16/2021   Leg swelling 10/27/2021   Encounter for monoclonal antibody treatment for malignancy 09/29/2021   Ureterovesical junction (UVJ)  obstruction 07/12/2021   Acute pyelonephritis 07/12/2021   CKD (chronic kidney disease), stage II 07/12/2021   Hyperlipidemia 07/12/2021   Chronic heart failure with preserved ejection fraction (HFpEF) (HCC) 07/12/2021   Hydroureteronephrosis 07/12/2021   Pyelonephritis 07/02/2021   Drug-induced neutropenia (HCC) 06/23/2021    Chemotherapy induced diarrhea 06/09/2021   Anemia due to antineoplastic chemotherapy 05/18/2021   Chemotherapy induced nausea and vomiting 05/18/2021   COVID-19 04/30/2021   Neutropenia (HCC)    Intractable vomiting    Diarrhea    Neutropenic fever (HCC) 04/05/2021   Encounter for antineoplastic chemotherapy 03/24/2021   Neuropathy 03/24/2021   Port-A-Cath in place 03/24/2021   HER2-positive carcinoma of breast (HCC) 01/26/2021   Goals of care, counseling/discussion 01/26/2021   Malignant neoplasm of upper-outer quadrant of right female breast (HCC) 01/19/2021   Left arm pain 12/02/2020   Soft tissue mass 12/02/2020   Candidal intertrigo 09/09/2020   Urinary frequency 09/09/2020   Night sweat 03/31/2020   Family history of CLL (chronic lymphoid leukemia) 03/21/2020   Nasal drainage 12/18/2019   Sinus congestion 12/18/2019   Nausea 10/30/2019   Major depression in remission (HCC) 08/27/2018   Primary osteoarthritis involving multiple joints 08/27/2018   COPD (chronic obstructive pulmonary disease) (HCC) 08/27/2018   Obesity (BMI 35.0-39.9 without comorbidity) 05/31/2018   Bradycardia 05/22/2017   RLS (restless legs syndrome) 04/13/2017   OSA (obstructive sleep apnea) 01/09/2017   Hypokalemia 07/28/2016   Atypical chest pain    Atrial fibrillation/flutter (HCC) 07/27/2016   Fatty liver disease, nonalcoholic 06/30/2016   Vertigo 29/56/2130   Family history of ovarian cancer 06/08/2016   GERD (gastroesophageal reflux disease) 05/05/2016   Hypertension 11/11/2014   Depression 11/11/2014   Weight loss 11/11/2014   PCP:  Allegra Grana, FNP Pharmacy:   [phone], [place] - [address] [place] [address] Phone: [phone] Fax: [phone]  [id number] [place], [place] - [address] [place] [address] Phone: [phone] Fax: [phone]  [facility] [address] [place] [address] Phone: [phone] Fax: [phone]     Social Drivers of Health (SDOH) Social History: SDOH Screenings   Food Insecurity: No Food Insecurity (10/16/2023)  Housing: Low Risk  (10/16/2023)  Transportation Needs: No Transportation Needs (10/16/2023)  Utilities: Not At Risk (10/16/2023)  Recent Concern: Utilities - At Risk (09/11/2023)  Alcohol Screen: Low Risk  (07/14/2023)  Depression (PHQ2-9): Low Risk  (08/29/2023)  Financial Resource Strain: Low Risk  (08/23/2023)   Received from [facility]  Physical Activity: Insufficiently Active (07/14/2023)  Social Connections: Socially Integrated (10/16/2023)  Stress: Stress Concern Present (07/14/2023)  Tobacco Use: Medium Risk (10/17/2023)   SDOH Interventions:     Readmission Risk Interventions    07/13/2021    1:04 PM 04/09/2021    1:02 PM  Readmission Risk Prevention Plan  Transportation Screening Complete Complete  PCP or Specialist Appt within 3-5 Days  Complete  HRI or Home Care Consult  Complete  Social Work Consult for Recovery Care Planning/Counseling  Complete  Palliative Care Screening  Not Applicable  Medication Review Oceanographer) Complete Complete  Palliative Care Screening Not Applicable   Skilled Nursing Facility Not Applicable

## 2023-10-18 NOTE — Assessment & Plan Note (Addendum)
 10-16-2023 through 10-20-2023 Hemoglobin at 8.6 this morning, likely secondary to blood loss during surgery. -Starting her on supplement. -Monitor hemoglobin  10-21-2023 stable HgB.

## 2023-10-18 NOTE — Plan of Care (Signed)

## 2023-10-18 NOTE — Assessment & Plan Note (Addendum)
 10-16-2023 through 10-20-2023 History of significant arthritis s/p left hip and knee replacement. MRI with concern of fluid collection/abscess, s/p arthrocentesis with IR-preliminary labs with WBC of above 14,000 with 93% neutrophil.  No fever or leukocytosis S/p left hip revision arthroplasty and a drain placement on 4/8 Intraoperative cultures with  methicillin-resistant Staphylococcus Lugdunensis  - Antibiotics switched with daptomycin by ID - Patient will need 6 weeks of daptomycin with end date of 11/27/2023 -PICC line ordered but later decided to use existing port which will be accessed before she leaves. -ID is working on home antibiotics -Wound VAC will remain in place for another 1 week-orthopedic will remove as outpatient -Continue with supportive care and pain management  10-21-2023 pt refusing to be discharged. Orthopedics has seen patient. Ortho adjusting pain medications. Pt is medically stable for discharge. Since pt is refusing DC to home, will start SNF bed admission process.  **update. Pt is now agreeable to being discharge to home. Home health PT, OT are re-ordered. Initial order for home health PT/OT placed on 10-17-2023. Will re-order home health PT/OT and add RN to look at negative pressure dressing and to monitor her closed surgical wound.

## 2023-10-18 NOTE — Assessment & Plan Note (Addendum)
 10-16-2023 through 10-20-2023 Patient was complaining of some dysuria this morning, likely due to Foley placement during surgery.  Foley was removed earlier today and she is able to void.  UA with mild hematuria, likely mild cystitis/inflammation with Foley. Urine cultures negative  10-21-2023 pt does not have a UTI.

## 2023-10-18 NOTE — Progress Notes (Signed)
 Progress Note   Patient: Amber Barnes UJW:[id number] DOB: 1951/04/13 DOA: 10/16/2023     0 DOS: the patient was seen and examined on 10/18/2023   Brief hospital course: Taken from H&P.    Amber Barnes is a 73 y.o. female with medical history significant of multiple OA status post left hip replacement and recent left knee replacement, HTN, PAF status post ablation on Eliquis, chronic HFpEF, HLD, breast cancer status post chemo/immunotherapy/radiation and surgery, hypothyroidism, presented with worsening of left hip pain.   Patient had left hip replacement back in October 2024 and has been doing well. Lately she has had worsening of left knee pain and underwent left knee TKR and started on PT therapy 2 weeks ago and she has been doing well.   On presentation vital and labs stable.X-ray of left hip showed intact hardware,   X-ray left knee showed no acute findings.  Lumbar spine MRI showed multiple level degenerative changes but most are chronic including findings on L1-L2 L2-L3 L3-4 and L4-L5.  MRI of hip with increased size of heterogenous fluid collection along the surgical track, concerning for abscess with surrounding soft tissue and intramuscular edema.  Orthopedic surgery was consulted and left hip arthrocentesis with IR was ordered.  Antibiotics were held until cultures were taken. Also concern of L3 radiculitis.  4/8: Vitals with mildly elevated blood pressure at 155/67.  CRP significantly elevated at 17, ESR 80.  IR guided arthrocentesis with WBC of 14 749, 93% neutrophil.  Orthopedic surgery is going to take her to the OR for washout later today.  4/9: Vital stable with borderline bradycardia, s/p excisional total hip arthroplasty with placement of antibiotic beads and drain on left on 4/8.  Labs with decrease of hemoglobin to 9, likely secondary to blood loss during surgery, starting on supplement.  Preliminary cultures with no organisms seen.  Patient will be weightbearing as  tolerated on left extremity per orthopedic surgery note. Orthopedic surgery ordered UA as she was complaining of mild dysuria likely due to Foley catheter placement during surgery, it was removed earlier this morning.  Patient is able to void.  UA with mild hematuria only.  Urine cultures were also ordered by orthopedic surgery, likely just had mild cystitis/inflammation with Foley and not an infection. Started on ceftriaxone-ID was also consulted at orthopedic request.   Assessment and Plan: * Left hip pain History of significant arthritis s/p left hip and knee replacement. MRI with concern of fluid collection/abscess, s/p arthrocentesis with IR-preliminary labs with WBC of above 14,000 with 93% neutrophil.  No fever or leukocytosis S/p left hip revision arthroplasty and a drain placement on 4/8 Intraoperative cultures with no organisms seen yet Cell counts more consistent with infection ID consult was placed -Continue with supportive care and pain management  Atrial fibrillation/flutter (HCC) S/p ablation and remained in sinus rhythm -Continue with home bisoprolol and Eliquis  Dysuria Patient was complaining of some dysuria this morning, likely due to Foley placement during surgery.  Foley was removed earlier today and she is able to void.  UA with mild hematuria, likely mild cystitis/inflammation with Foley. Urine cultures were also ordered by orthopedic surgery and still pending. - UTI less likely but she was started on ceftriaxone which will cover her UTI and concern of septic joint.  Hypertension Blood pressure mildly elevated.  Patient was taking low-dose losartan and hydrochlorothiazide combination pill as needed at home. - Continue home losartan and hydrochlorothiazide - As needed hydralazine  Acute postoperative anemia due to expected  blood loss Hemoglobin at 9 this morning, likely secondary to blood loss during surgery. -Starting her on supplement -Monitor  hemoglobin  Depression - Continue home Lexapro  Hyperlipidemia - Continue home Crestor  Chronic heart failure with preserved ejection fraction (HFpEF) (HCC) Clinically appears euvolemic. - Continue home low-dose Lasix  RLS (restless legs syndrome) - Continue home Requip   Subjective: Patient was complaining of mild dysuria started this morning.  Left hip pain much improved.  Physical Exam: Vitals:   10/17/23 2100 10/18/23 0131 10/18/23 0529 10/18/23 0831  BP: 138/73 136/61 132/61 137/62  Pulse: 73 (!) 55 (!) 56 (!) 49  Resp: 16   16  Temp: 98 F (36.7 C) 97.9 F (36.6 C) 98 F (36.7 C) (!) 97.5 F (36.4 C)  TempSrc: Oral  Oral Oral  SpO2: 97% 97% 100% 99%  Weight:      Height:       General.  Morbidly obese lady, in no acute distress. Pulmonary.  Lungs clear bilaterally, normal respiratory effort. CV.  Regular rate and rhythm, no JVD, rub or murmur. Abdomen.  Soft, nontender, nondistended, BS positive. CNS.  Alert and oriented .  No focal neurologic deficit. Extremities.  No edema, no cyanosis, pulses intact and symmetrical.  Left hip with bandage and drain in place Psychiatry.  Judgment and insight appears normal.   Data Reviewed: Prior data reviewed  Family Communication: Discussed with patient  Disposition: Status is: Inpatient  inpatient because: Severity of illness  Planned Discharge Destination: Home  DVT prophylaxis.  Eliquis Time spent: 44 minutes  This record has been created using Conservation officer, historic buildings. Errors have been sought and corrected,but may not always be located. Such creation errors do not reflect on the standard of care.   Author: Arnetha Courser, MD 10/18/2023 1:54 PM  For on call review [email or web address].

## 2023-10-19 ENCOUNTER — Encounter: Payer: Self-pay | Admitting: Orthopedic Surgery

## 2023-10-19 DIAGNOSIS — M25552 Pain in left hip: Secondary | ICD-10-CM | POA: Diagnosis not present

## 2023-10-19 DIAGNOSIS — M5416 Radiculopathy, lumbar region: Secondary | ICD-10-CM | POA: Diagnosis not present

## 2023-10-19 DIAGNOSIS — T8450XA Infection and inflammatory reaction due to unspecified internal joint prosthesis, initial encounter: Secondary | ICD-10-CM

## 2023-10-19 DIAGNOSIS — B957 Other staphylococcus as the cause of diseases classified elsewhere: Secondary | ICD-10-CM | POA: Diagnosis not present

## 2023-10-19 DIAGNOSIS — R262 Difficulty in walking, not elsewhere classified: Secondary | ICD-10-CM | POA: Diagnosis not present

## 2023-10-19 DIAGNOSIS — T8452XA Infection and inflammatory reaction due to internal left hip prosthesis, initial encounter: Secondary | ICD-10-CM | POA: Diagnosis not present

## 2023-10-19 DIAGNOSIS — I4891 Unspecified atrial fibrillation: Secondary | ICD-10-CM | POA: Diagnosis not present

## 2023-10-19 LAB — CBC
HCT: 25.7 % — ABNORMAL LOW (ref 36.0–46.0)
Hemoglobin: 8.6 g/dL — ABNORMAL LOW (ref 12.0–15.0)
MCH: 31.2 pg (ref 26.0–34.0)
MCHC: 33.5 g/dL (ref 30.0–36.0)
MCV: 93.1 fL (ref 80.0–100.0)
Platelets: 181 10*3/uL (ref 150–400)
RBC: 2.76 MIL/uL — ABNORMAL LOW (ref 3.87–5.11)
RDW: 12.8 % (ref 11.5–15.5)
WBC: 4.9 10*3/uL (ref 4.0–10.5)
nRBC: 0 % (ref 0.0–0.2)

## 2023-10-19 LAB — URINE CULTURE: Culture: NO GROWTH

## 2023-10-19 MED ORDER — MAGNESIUM HYDROXIDE 400 MG/5ML PO SUSP: 30.0000 mL | Freq: Every day | ORAL | Status: DC | PRN

## 2023-10-19 MED ORDER — VANCOMYCIN HCL 1500 MG/300ML IV SOLN
1500.0000 mg | INTRAVENOUS | Status: DC
  Filled 2023-10-19: qty 300

## 2023-10-19 MED ORDER — VANCOMYCIN HCL 2000 MG/400ML IV SOLN
2000.0000 mg | Freq: Once | INTRAVENOUS | Status: AC
  Administered 2023-10-19: 2000 mg via INTRAVENOUS
  Filled 2023-10-19: qty 400

## 2023-10-19 MED ORDER — TRAMADOL HCL 50 MG PO TABS: 50.0000 mg | ORAL_TABLET | Freq: Four times a day (QID) | ORAL | 0 refills | Status: DC | PRN

## 2023-10-19 NOTE — Plan of Care (Signed)

## 2023-10-19 NOTE — Progress Notes (Signed)
 Physical Therapy Treatment Patient Details Name: Amber Barnes MRN: [id number] DOB: February 26, 1951 Today's Date: 10/19/2023   History of Present Illness Pt is a 73 yo F admitted with increased pain in L hip, she has history of recent  L TKA 1 month ago, L THA in 2024. Now s/p L THA with antibiotic beads. PMH includes BLE THA, COPD, HTN, A-fib, and anxiety.  Of note pt reports history of cardiac ablation procedure with baseline HR of 50-60 bpm.    PT Comments  Patient received in bed, she is agreeable to PT session. Patient reports minimal pain. Hemovac removed this am. Patient is mod I with bed mobility. Transfers with cga, ambulated 175 feet with RW and supervision. Limited by L thigh cramping during gait requiring 2 brief standing rest breaks. Patient will continue to benefit from skilled PT to improve independence, strength and endurance.      If plan is discharge home, recommend the following: A little help with walking and/or transfers;A little help with bathing/dressing/bathroom   Can travel by private vehicle      yes  Equipment Recommendations  None recommended by PT    Recommendations for Other Services       Precautions / Restrictions Precautions Precautions: Anterior Hip;Fall Recall of Precautions/Restrictions: Intact Restrictions Weight Bearing Restrictions Per Provider Order: Yes LLE Weight Bearing Per Provider Order: Weight bearing as tolerated     Mobility  Bed Mobility Overal bed mobility: Modified Independent                  Transfers Overall transfer level: Needs assistance Equipment used: Rolling walker (2 wheels) Transfers: Sit to/from Stand Sit to Stand: Contact guard assist                Ambulation/Gait Ambulation/Gait assistance: Supervision Gait Distance (Feet): 175 Feet Assistive device: Rolling walker (2 wheels) Gait Pattern/deviations: Step-through pattern, Decreased step length - right, Decreased step length - left, Decreased  weight shift to left Gait velocity: decr     General Gait Details: patient ambulating well with RW, supervision only. mildly increased pain session with thigh cramping with ambulation.   Stairs             Wheelchair Mobility     Tilt Bed    Modified Rankin (Stroke Patients Only)       Balance Overall balance assessment: Modified Independent                                          Communication Communication Communication: No apparent difficulties  Cognition Arousal: Alert Behavior During Therapy: WFL for tasks assessed/performed   PT - Cognitive impairments: No apparent impairments                         Following commands: Intact      Cueing Cueing Techniques: Verbal cues  Exercises      General Comments        Pertinent Vitals/Pain Pain Assessment Pain Assessment: Faces Faces Pain Scale: Hurts little more Pain Location: L LE Pain Descriptors / Indicators: Discomfort, Cramping, Sore Pain Intervention(s): Monitored during session, Repositioned    Home Living                          Prior Function  PT Goals (current goals can now be found in the care plan section) Acute Rehab PT Goals Patient Stated Goal: return home PT Goal Formulation: With patient/family Time For Goal Achievement: 10/21/23 Potential to Achieve Goals: Good Progress towards PT goals: Progressing toward goals    Frequency    BID      PT Plan      Co-evaluation              AM-PAC PT "6 Clicks" Mobility   Outcome Measure  Help needed turning from your back to your side while in a flat bed without using bedrails?: None Help needed moving from lying on your back to sitting on the side of a flat bed without using bedrails?: None Help needed moving to and from a bed to a chair (including a wheelchair)?: A Little Help needed standing up from a chair using your arms (e.g., wheelchair or bedside chair)?: A  Little Help needed to walk in hospital room?: A Little Help needed climbing 3-5 steps with a railing? : A Little 6 Click Score: 20    End of Session   Activity Tolerance: Patient tolerated treatment well Patient left: in chair;with call bell/phone within reach;with chair alarm set Nurse Communication: Mobility status PT Visit Diagnosis: Muscle weakness (generalized) (M62.81);Pain;Difficulty in walking, not elsewhere classified (R26.2) Pain - Right/Left: Left Pain - part of body: Hip     Time: 1013-1027 PT Time Calculation (min) (ACUTE ONLY): 14 min  Charges:    $Gait Training: 8-22 mins PT General Charges $$ ACUTE PT VISIT: 1 Visit                     Amber Barnes, PT, GCS 10/19/23,11:09 AM

## 2023-10-19 NOTE — Plan of Care (Signed)
  Problem: Education: Goal: Knowledge of General Education information will improve Description: Including pain rating scale, medication(s)/side effects and non-pharmacologic comfort measures Outcome: Progressing   Problem: Health Behavior/Discharge Planning: Goal: Ability to manage health-related needs will improve Outcome: Progressing   Problem: Clinical Measurements: Goal: Ability to maintain clinical measurements within normal limits will improve Outcome: Progressing Goal: Will remain free from infection Outcome: Progressing Goal: Diagnostic test results will improve Outcome: Progressing   Problem: Coping: Goal: Level of anxiety will decrease Outcome: Progressing   Problem: Pain Managment: Goal: General experience of comfort will improve and/or be controlled Outcome: Progressing   Problem: Safety: Goal: Ability to remain free from injury will improve Outcome: Progressing   Problem: Skin Integrity: Goal: Risk for impaired skin integrity will decrease Outcome: Progressing

## 2023-10-19 NOTE — Anesthesia Postprocedure Evaluation (Signed)
 Anesthesia Post Note  Patient: Amber Barnes  Procedure(s) Performed: EXCISIONAL TOTAL HIP ARTHROPLASTY WITH PLACEMENT OF ANTIBIOTIC BEADS AND DRAIN (Left: Hip)  Patient location during evaluation: PACU Anesthesia Type: General Level of consciousness: awake and alert Pain management: pain level controlled Vital Signs Assessment: post-procedure vital signs reviewed and stable Respiratory status: spontaneous breathing, nonlabored ventilation, respiratory function stable and patient connected to nasal cannula oxygen Cardiovascular status: blood pressure returned to baseline and stable Postop Assessment: no apparent nausea or vomiting Anesthetic complications: yes   Encounter Notable Events  Notable Event Outcome Phase Comment  Difficult to intubate - expected  Intraprocedure Filed from anesthesia note documentation.     Last Vitals:  Vitals:   10/18/23 2234 10/19/23 0436  BP:  131/60  Pulse: (!) 54 61  Resp:  18  Temp:  36.6 C  SpO2:  98%    Last Pain:  Vitals:   10/19/23 0526  TempSrc:   PainSc: Asleep                 Louie Boston

## 2023-10-19 NOTE — Assessment & Plan Note (Addendum)
 10-16-2023 through 10-20-2023  Blood pressure within goal. - Continue home losartan and hydrochlorothiazide - As needed hydralazine  10-21-2023 stable.

## 2023-10-19 NOTE — Progress Notes (Signed)
 ID Pt doing better Worked with PT that aggravated the pain left hip Drain removed   O/e Awake and alert Patient Vitals for the past 24 hrs:  BP Temp Temp src Pulse Resp SpO2  10/19/23 2020 (!) 117/59 97.9 F (36.6 C) -- 71 16 99 %  10/19/23 1621 (!) 131/57 97.7 F (36.5 C) Oral 75 16 100 %  10/19/23 0905 (!) 123/49 97.8 F (36.6 C) Oral 62 16 98 %  10/19/23 0436 131/60 97.8 F (36.6 C) -- 61 18 98 %  10/18/23 2234 -- -- -- (!) 54 -- --   Chest CTA Hss1s2 Abd soft Left hip- surgical site vac present Cns non focal Labs    Latest Ref Rng & Units 10/19/2023    4:44 AM 10/18/2023    4:46 AM 10/16/2023    1:07 AM  CBC  WBC 4.0 - 10.5 K/uL 4.9  7.4  5.5   Hemoglobin 12.0 - 15.0 g/dL 8.6  9.0  91.4   Hematocrit 36.0 - 46.0 % 25.7  25.9  31.1   Platelets 150 - 400 K/uL 181  209  172        Latest Ref Rng & Units 10/18/2023    4:46 AM 10/16/2023    1:07 AM 09/12/2023    5:56 AM  CMP  Glucose 70 - 99 mg/dL 97  96  782   BUN 8 - 23 mg/dL 15  18  23    Creatinine 0.44 - 1.00 mg/dL 9.56  2.13  0.86   Sodium 135 - 145 mmol/L 136  135  135   Potassium 3.5 - 5.1 mmol/L 4.3  4.0  4.2   Chloride 98 - 111 mmol/L 105  103  105   CO2 22 - 32 mmol/L 25  23  23    Calcium 8.9 - 10.3 mg/dL 7.8  8.2  8.3     Micro Aspiration culture staph lugdunesis All 4 surgical cultures NG so far  Impression/recommendation  Left hip prosthetic joint infection- S/p irrigation and debridement with head and liner hip revision Cultures from the aspiration done by IR is staph lugdunensis Need to corroborate with surgical cultures as this can be a skin contaminant or a true pathogen Pt is currently on ceftriaxone, add vanco    Minimal dysuria-resolved- UC NG    Recent left TKA- site is healthy   H/o ca breast s/p surgery, immune therapy and radiation   Port present   H/o afib on amoidarone Cardiac ablation  Discussed the management with patient- will discuss with Dr.Aberman

## 2023-10-19 NOTE — Progress Notes (Signed)
 Subjective: 2 Days Post-Op Procedure(s) (LRB): EXCISIONAL TOTAL HIP ARTHROPLASTY WITH PLACEMENT OF ANTIBIOTIC BEADS AND DRAIN (Left) Patient reports pain as mild.   Patient is well, and has had no acute complaints or problems Denies any CP, SOB, ABD pain. We will continue therapy today.   Objective: Vital signs in last 24 hours: Temp:  [97.8 F (36.6 C)-98 F (36.7 C)] 97.8 F (36.6 C) (04/10 0905) Pulse Rate:  [54-62] 62 (04/10 0905) Resp:  [16-18] 16 (04/10 0905) BP: (100-131)/(42-61) 123/49 (04/10 0905) SpO2:  [97 %-98 %] 98 % (04/10 0905)  Intake/Output from previous day: 04/09 0701 - 04/10 0700 In: 1703.3 [P.O.:600; I.V.:803.3; IV Piggyback:300] Out: 40 [Drains:40] Intake/Output this shift: No intake/output data recorded.  Recent Labs    10/18/23 0446 10/19/23 0444  HGB 9.0* 8.6*   Recent Labs    10/18/23 0446 10/19/23 0444  WBC 7.4 4.9  RBC 2.93* 2.76*  HCT 25.9* 25.7*  PLT 209 181   Recent Labs    10/18/23 0446  NA 136  K 4.3  CL 105  CO2 25  BUN 15  CREATININE 0.85  GLUCOSE 97  CALCIUM 7.8*   No results for input(s): "LABPT", "INR" in the last 72 hours.  EXAM General - Patient is Alert, Appropriate, and Oriented Extremity - Neurovascular intact Sensation intact distally Intact pulses distally Dorsiflexion/Plantar flexion intact No cellulitis present Compartment soft Dressing - dressing C/D/I and no drainage in provena, hemovac with drainage present. Hemovac removed Motor Function - intact, moving foot and toes well on exam.   Past Medical History:  Diagnosis Date   Achilles tendinitis    Acute medial meniscal tear    Allergy    Anemia    Anxiety    a.) on BZO PRN (alprazolam)   Aortic atherosclerosis (HCC)    Ascending aorta dilatation (HCC) 10/20/2022   a.) TTE 10/20/2022: asc Ao 39 mm   Atrial fibrillation/flutter (HCC) 07/2016   a.) Dx'd 07/2016; b.) CHA2DS2-VASc = 5 (age, sex, HFmrEF, HTN, vasc disease history) as of  05/09/2023; c.) s/p DCCV 09/07/2016 (150 J x 2 --> A.flutter; 200 J x 1 --> NSR); d.) s/p DCCV 10/19/2016 (150 J x 1 --> NSR); e.) brought in for DCCV 12/2016; cancelled d/t NSR; f.) s/p PVI ablation 12/12/2022; g.) cardiac rate/rhythm maintained on oral bisoprolol; chronically anticoagulated using apixaban   BMI 50.0-59.9, adult (HCC) 07/16/2018   CAD (coronary artery disease)    a.) cCTA 12/08/2022: Ca2+ = 31.6 (58th %'ile for age/sex/race match control)   Cardiomegaly    Chemotherapy induced nausea and vomiting 05/18/2021   Chronic heart failure with mid-range ejection fraction (HFmrEF) (HCC)    a.) TTE 03/08/21: EF 60-65, mild LVH, G1DD, mild LAE, triv MR; b.) TTE 07/29/21: EF 60-65, G1DD, mild LAE, triv MR; c.) TTE 11/26/21: EF 55-60, G1DD, mild LAE, mild MR; d.) TTE 02/25/22: EF 60-65, G1DD, mild LAE, mild MR; e.) TTE 05/31/22: EF 55-60, mild LVH, G2DD, mild-mod MR; f.) TTE 10/20/22: EF 45-50, mild LAE, mod red RVSF, mild-mod TR, Asc Ao 39; g.) TTE 12/13/22: EF 50-55, mild LVH, mild MAC   CKD (chronic kidney disease), stage II    Colon polyp    Complication of anesthesia    a.) emergence delirium; b.) hypoxia after hip replacement   COPD (chronic obstructive pulmonary disease) (HCC)    DDD (degenerative disc disease), lumbar    a.) s/p fusion   Depression    Endometriosis    Fibrocystic breast disease  GERD (gastroesophageal reflux disease)    History of kidney stones    History of stress test    a.) MV 07/29/2016: EF 55-65%. Small, mild defect  in mid anteroseptal, apical septal, and apical locations. No ischemia-->low risk; b.) MV 10/2022: mod sized partially rev apical ant, apical septal, and apical defect. EF 52%. No signific cor Ca2+; c.) cCTA 12/08/2022: Ca2+ = 31.6 (58th %'ile).   History of tobacco use    Hyperlipidemia    Hypertension    Hypothyroidism    a.) s/p partial thyroidectomy for nodule; pathology benign   Insomnia    Left knee pain, unspecified chronicity    Lipoma     Malignant neoplasm of upper-outer quadrant of right female breast (HCC) 03/03/2021   a.) stage 1A invasive mammary carcinoma (cT1b or T1c N0); ER (-), PR (+), HER2/neu (+); did not tolerate adjuvant chemotherapy (paclitacel + trastuzumab) despite paclitaxel dose reduction; s/p adjuvant XRT; cinished maintenance trastuzumab 07/27/2022; no adjuvant endocrine therapy as no clear benefit   Migraine    Nephrolithiasis    On apixaban therapy    OSA (obstructive sleep apnea)    a.) non-compliant with nocturnal PAP therapy   Osteoarthritis    Osteopenia    Peptic ulcer disease    a.) H. pylori   Pericarditis 12/12/2022   a.) following A.fib ablation; resolved   Pneumonia    Port-A-Cath in place    RLS (restless legs syndrome)    a.) on ropinirole   Vitamin D deficiency     Assessment/Plan:   2 Days Post-Op Procedure(s) (LRB): EXCISIONAL TOTAL HIP ARTHROPLASTY WITH PLACEMENT OF ANTIBIOTIC BEADS AND DRAIN (Left) Principal Problem:   Left hip pain Active Problems:   Hypertension   Depression   Atrial fibrillation/flutter (HCC)   RLS (restless legs syndrome)   Hyperlipidemia   Chronic heart failure with preserved ejection fraction (HFpEF) (HCC)   Intractable pain   Lumbar radiculitis   Unable to ambulate   Dysuria   Acute postoperative anemia due to expected blood loss  Estimated body mass index is 39.61 kg/m as calculated from the following:   Height as of this encounter: 5\' 5"  (1.651 m).   Weight as of this encounter: 108 kg. Advance diet Up with therapy Pain well controlled VSS Acute post op blood loss anemia - Hgb 8.6, stable, continue with Fe supplement Appreciate ID consult - Culture showing staphylococcus lugdogensis.   Continue with IV per ID reccomendations Work on [employer]  Patient will need follow up with [facility] ortho 7 days after discharge for provena removal and wound check.  DVT Prophylaxis - TED hose and SCDS Eliquis Weight-Bearing as tolerated to left leg   T. Cranston Neighbor, PA-C [facility] 10/19/2023, 10:04 AM

## 2023-10-19 NOTE — Progress Notes (Signed)
 Physical Therapy Treatment Patient Details Name: Amber Barnes MRN: [id number] DOB: [birth year] Today's Date: 10/19/2023   History of Present Illness Pt is a 73 yo F admitted with increased pain in L hip, she has history of recent  L TKA 1 month ago, L THA in 2024. Now s/p L THA with antibiotic beads. PMH includes BLE THA, COPD, HTN, A-fib, and anxiety.  Of note pt reports history of cardiac ablation procedure with baseline HR of 50-60 bpm.    PT Comments  Patient received up in recliner. Reports she is ready to walk again, then take a nap. Patient is mod I with transfers and ambulation in hallway. Increased distance this pm. Pain just in groin/hip area. Patient making good progress and will continue to benefit from mobilization for independence.     If plan is discharge home, recommend the following: A little help with walking and/or transfers;A little help with bathing/dressing/bathroom   Can travel by private vehicle      yes  Equipment Recommendations  None recommended by PT    Recommendations for Other Services       Precautions / Restrictions Precautions Precautions: Anterior Hip;Fall Recall of Precautions/Restrictions: Intact Restrictions Weight Bearing Restrictions Per Provider Order: Yes LLE Weight Bearing Per Provider Order: Weight bearing as tolerated     Mobility  Bed Mobility Overal bed mobility: Modified Independent             General bed mobility comments: able to get back into bed independently    Transfers Overall transfer level: Modified independent Equipment used: Rolling walker (2 wheels) Transfers: Sit to/from Stand Sit to Stand: Modified independent (Device/Increase time)                Ambulation/Gait Ambulation/Gait assistance: Modified independent (Device/Increase time) Gait Distance (Feet): 225 Feet Assistive device: Rolling walker (2 wheels) Gait Pattern/deviations: Step-through pattern Gait velocity: decr     General Gait  Details: patient ambulating well with RW, supervision only. mildly increased pain session with thigh cramping with ambulation.   Stairs             Wheelchair Mobility     Tilt Bed    Modified Rankin (Stroke Patients Only)       Balance Overall balance assessment: Modified Independent                                          Communication Communication Communication: No apparent difficulties  Cognition Arousal: Alert Behavior During Therapy: WFL for tasks assessed/performed   PT - Cognitive impairments: No apparent impairments                         Following commands: Intact      Cueing Cueing Techniques: Verbal cues  Exercises      General Comments        Pertinent Vitals/Pain Pain Assessment Pain Assessment: Faces Faces Pain Scale: Hurts little more Pain Location: L LE (hip/groin area) Pain Descriptors / Indicators: Discomfort, Grimacing Pain Intervention(s): Monitored during session, Patient requesting pain meds-RN notified    Home Living                          Prior Function            PT Goals (current goals can now be found in the care plan  section) Acute Rehab PT Goals Patient Stated Goal: return home PT Goal Formulation: With patient/family Time For Goal Achievement: 10/21/23 Potential to Achieve Goals: Good Progress towards PT goals: Progressing toward goals    Frequency    7X/week      PT Plan      Co-evaluation              AM-PAC PT "6 Clicks" Mobility   Outcome Measure  Help needed turning from your back to your side while in a flat bed without using bedrails?: None Help needed moving from lying on your back to sitting on the side of a flat bed without using bedrails?: None Help needed moving to and from a bed to a chair (including a wheelchair)?: None Help needed standing up from a chair using your arms (e.g., wheelchair or bedside chair)?: None Help needed to walk in  hospital room?: A Little Help needed climbing 3-5 steps with a railing? : A Little 6 Click Score: 22    End of Session   Activity Tolerance: Patient tolerated treatment well Patient left: in bed;with call bell/phone within reach Nurse Communication: Mobility status PT Visit Diagnosis: Muscle weakness (generalized) (M62.81);Pain;Difficulty in walking, not elsewhere classified (R26.2) Pain - Right/Left: Left Pain - part of body: Hip     Time: 1610-9604 PT Time Calculation (min) (ACUTE ONLY): 13 min  Charges:    $Gait Training: 8-22 mins PT General Charges $$ ACUTE PT VISIT: 1 Visit                     Cougar Imel, PT, GCS 10/19/23,2:34 PM

## 2023-10-19 NOTE — Progress Notes (Signed)
 Progress Note   Patient: Amber Barnes WUJ:[id number] DOB: 1951-01-04 DOA: 10/16/2023     1 DOS: the patient was seen and examined on 10/19/2023   Brief hospital course: Taken from H&P.    Amber Barnes is a 73 y.o. female with medical history significant of multiple OA status post left hip replacement and recent left knee replacement, HTN, PAF status post ablation on Eliquis, chronic HFpEF, HLD, breast cancer status post chemo/immunotherapy/radiation and surgery, hypothyroidism, presented with worsening of left hip pain.   Patient had left hip replacement back in October 2024 and has been doing well. Lately she has had worsening of left knee pain and underwent left knee TKR and started on PT therapy 2 weeks ago and she has been doing well.   On presentation vital and labs stable.X-ray of left hip showed intact hardware,   X-ray left knee showed no acute findings.  Lumbar spine MRI showed multiple level degenerative changes but most are chronic including findings on L1-L2 L2-L3 L3-4 and L4-L5.  MRI of hip with increased size of heterogenous fluid collection along the surgical track, concerning for abscess with surrounding soft tissue and intramuscular edema.  Orthopedic surgery was consulted and left hip arthrocentesis with IR was ordered.  Antibiotics were held until cultures were taken. Also concern of L3 radiculitis.  4/8: Vitals with mildly elevated blood pressure at 155/67.  CRP significantly elevated at 17, ESR 80.  IR guided arthrocentesis with WBC of 14 749, 93% neutrophil.  Orthopedic surgery is going to take her to the OR for washout later today.  4/9: Vital stable with borderline bradycardia, s/p excisional total hip arthroplasty with placement of antibiotic beads and drain on left on 4/8.  Labs with decrease of hemoglobin to 9, likely secondary to blood loss during surgery, starting on supplement.  Preliminary cultures with no organisms seen.  Patient will be weightbearing as  tolerated on left extremity per orthopedic surgery note. Orthopedic surgery ordered UA as she was complaining of mild dysuria likely due to Foley catheter placement during surgery, it was removed earlier this morning.  Patient is able to void.  UA with mild hematuria only.  Urine cultures were also ordered by orthopedic surgery, likely just had mild cystitis/inflammation with Foley and not an infection. Started on ceftriaxone-ID was also consulted at orthopedic request.  4/10: Remained hemodynamically stable.  Urine cultures with no growth.  Multiple cultures from hip joint and only warm is growing Methicillin-resistant Staphylococcus lugdunensis-started on vancomycin   CBC with hemoglobin of 8.6-all cell line decreased. Drain was removed, patient will go home with wound VAC in place   Assessment and Plan: * Left hip pain History of significant arthritis s/p left hip and knee replacement. MRI with concern of fluid collection/abscess, s/p arthrocentesis with IR-preliminary labs with WBC of above 14,000 with 93% neutrophil.  No fever or leukocytosis S/p left hip revision arthroplasty and a drain placement on 4/8 Intraoperative cultures with no organisms seen yet Cell counts more consistent with infection One of the many synovial fluid culture with methicillin-resistant Staphylococcus Lugdunensis  - Started on vancomycin ID consult was placed -Continue with supportive care and pain management  Atrial fibrillation/flutter (HCC) S/p ablation and remained in sinus rhythm -Continue with home bisoprolol and Eliquis  Dysuria Patient was complaining of some dysuria this morning, likely due to Foley placement during surgery.  Foley was removed earlier today and she is able to void.  UA with mild hematuria, likely mild cystitis/inflammation with Foley. Urine cultures negative  Hypertension Blood pressure within goal. - Continue home losartan and hydrochlorothiazide - As needed  hydralazine  Acute postoperative anemia due to expected blood loss Hemoglobin at 8.6 this morning, likely secondary to blood loss during surgery. -Starting her on supplement -Monitor hemoglobin  Depression - Continue home Lexapro  Hyperlipidemia - Continue home Crestor  Chronic heart failure with preserved ejection fraction (HFpEF) (HCC) Clinically appears euvolemic. - Continue home low-dose Lasix  RLS (restless legs syndrome) - Continue home Requip   Subjective: Patient was sitting comfortably in chair when seen today.  Pain seems well-controlled and she was able to ambulate.  Physical Exam: Vitals:   10/18/23 2033 10/18/23 2234 10/19/23 0436 10/19/23 0905  BP: 123/61  131/60 (!) 123/49  Pulse: (!) 55 (!) 54 61 62  Resp: 18  18 16   Temp: 97.9 F (36.6 C)  97.8 F (36.6 C) 97.8 F (36.6 C)  TempSrc:    Oral  SpO2: 97%  98% 98%  Weight:      Height:       General.  Obese elderly lady, in no acute distress. Pulmonary.  Lungs clear bilaterally, normal respiratory effort. CV.  Regular rate and rhythm, no JVD, rub or murmur. Abdomen.  Soft, nontender, nondistended, BS positive. CNS.  Alert and oriented .  No focal neurologic deficit. Extremities.  No edema, no cyanosis, pulses intact and symmetrical.  Left hip with wound VAC in place. Psychiatry.  Judgment and insight appears normal.   Data Reviewed: Prior data reviewed  Family Communication: Discussed with patient  Disposition: Status is: Inpatient  inpatient because: Severity of illness  Planned Discharge Destination: Home  DVT prophylaxis.  Eliquis Time spent: 45 minutes  This record has been created using Conservation officer, historic buildings. Errors have been sought and corrected,but may not always be located. Such creation errors do not reflect on the standard of care.   Author: Arnetha Courser, MD 10/19/2023 2:17 PM  For on call review [email or web address].

## 2023-10-19 NOTE — Progress Notes (Signed)
 Pharmacy Antibiotic Note  Amber Barnes is a 73 y.o. female admitted on 10/16/2023 with hip prosthetic joint infection.  Pharmacy has been consulted for vancomycin dosing. She had R THA in Feb 2020, L THA Oct 2024 and L knee 09/11/2023.  SHe has a washout of L hip with head and liner revision on 10/17/2023  Today, 10/19/2023 Day #2 ceftriaxone, Day #1 vancomycin Renal; SCr WNL - at baseline WBC WNL Afeb 4/8 hip aspiration cx - oxacillin resistant S. Lugdunensis 4/8 multiple OR cultures NGTD   Plan: Vancomycin 2gm IV x 1 as loading dose followed by 1500mg  IV q24h Estimated AUC 551 (goal 400-600) Estimated Trough = 13 mcg/ml BMI 39.5 used Vd = 0.5 L/kg (and IBW for Ke) Watch renal function and check vancomycin level if remains here when reached steady state Ordered daptomycin MIC to be sent to [facility] Continue ceftriaxone 2gm IV q24h pending OR cultures  Height: 5\' 5"  (165.1 cm) Weight: 108 kg (238 lb) IBW/kg (Calculated) : 57  Temp (24hrs), Avg:97.9 F (36.6 C), Min:97.8 F (36.6 C), Max:98 F (36.7 C)  Recent Labs  Lab 10/16/23 0107 10/18/23 0446 10/19/23 0444  WBC 5.5 7.4 4.9  CREATININE 0.86 0.85  --     Estimated Creatinine Clearance: 75.3 mL/min (by C-G formula based on SCr of 0.85 mg/dL).    Allergies  Allergen Reactions   Hydrocodone    Prednisone Other (See Comments)    Agitation, hyperactivity, polyphagia   Amiodarone Nausea And Vomiting and Other (See Comments)   Hydrocodone-Acetaminophen Other (See Comments)    Hallucinations and combative   Tapentadol Itching   Adhesive [Tape] Itching and Rash    Prefers paper tape   Latex Itching and Rash    use paper tap   Oxycodone Itching    Can take it with benadryl   Tramadol Itching    Can take it with benadryl   Wound Dressing Adhesive     Thank you for allowing pharmacy to be a part of this patient's care.  Juliette Alcide, PharmD, BCPS, BCIDP Work Cell: [phone] 10/19/2023 10:55 AM

## 2023-10-20 ENCOUNTER — Other Ambulatory Visit: Payer: Self-pay

## 2023-10-20 DIAGNOSIS — I4891 Unspecified atrial fibrillation: Secondary | ICD-10-CM | POA: Diagnosis not present

## 2023-10-20 DIAGNOSIS — B958 Unspecified staphylococcus as the cause of diseases classified elsewhere: Secondary | ICD-10-CM

## 2023-10-20 DIAGNOSIS — R262 Difficulty in walking, not elsewhere classified: Secondary | ICD-10-CM | POA: Diagnosis not present

## 2023-10-20 DIAGNOSIS — M5416 Radiculopathy, lumbar region: Secondary | ICD-10-CM | POA: Diagnosis not present

## 2023-10-20 DIAGNOSIS — T8452XA Infection and inflammatory reaction due to internal left hip prosthesis, initial encounter: Secondary | ICD-10-CM | POA: Diagnosis not present

## 2023-10-20 DIAGNOSIS — M25552 Pain in left hip: Secondary | ICD-10-CM | POA: Diagnosis not present

## 2023-10-20 DIAGNOSIS — B957 Other staphylococcus as the cause of diseases classified elsewhere: Secondary | ICD-10-CM | POA: Diagnosis not present

## 2023-10-20 LAB — CREATININE, SERUM
Creatinine, Ser: 0.92 mg/dL (ref 0.44–1.00)
GFR, Estimated: >60 mL/min (ref >60)

## 2023-10-20 LAB — CK: Total CK: 56 U/L (ref 38–234)

## 2023-10-20 MED ORDER — SODIUM CHLORIDE 0.9 % IV SOLN
8.0000 mg/kg | Freq: Every day | INTRAVENOUS | Status: DC
  Administered 2023-10-20 – 2023-10-22 (×3): 600 mg via INTRAVENOUS
  Filled 2023-10-20 (×3): qty 12

## 2023-10-20 MED ORDER — SODIUM CHLORIDE 0.9% FLUSH
10.0000 mL | Freq: Two times a day (BID) | INTRAVENOUS | Status: DC
  Administered 2023-10-20: 10 mL
  Administered 2023-10-20 – 2023-10-21 (×2): 20 mL
  Administered 2023-10-21: 10 mL
  Administered 2023-10-22: 25 mL

## 2023-10-20 MED ORDER — SODIUM CHLORIDE 0.9% FLUSH: 10.0000 mL | INTRAVENOUS | Status: DC | PRN

## 2023-10-20 MED ORDER — ROSUVASTATIN CALCIUM 5 MG PO TABS: 5.0000 mg | ORAL_TABLET | Freq: Every day | ORAL | Status: DC

## 2023-10-20 MED ORDER — ROSUVASTATIN CALCIUM 5 MG PO TABS
5.0000 mg | ORAL_TABLET | Freq: Every day | ORAL | Status: DC
  Administered 2023-10-21 – 2023-10-22 (×2): 5 mg via ORAL
  Filled 2023-10-20 (×2): qty 1

## 2023-10-20 MED ORDER — CHLORHEXIDINE GLUCONATE CLOTH 2 % EX PADS
6.0000 | MEDICATED_PAD | Freq: Every day | CUTANEOUS | Status: DC
  Administered 2023-10-20 – 2023-10-22 (×3): 6 via TOPICAL

## 2023-10-20 NOTE — Progress Notes (Signed)
 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 To discharging provider:  please sign these orders via discharge navigator,  Select New Orders & click on the button choice - Manage This Unsigned Work.     Thank you for allowing pharmacy to be a part of this patient's care.  Juliette Alcide, PharmD, BCPS, BCIDP Work Cell: [phone] 10/20/2023 9:48 AM

## 2023-10-20 NOTE — Progress Notes (Signed)
 Physical Therapy Treatment Patient Details Name: Amber Barnes MRN: [id number] DOB: 1950-09-08 Today's Date: 10/20/2023   History of Present Illness Pt is a 73 yo F admitted with increased pain in L hip, she has history of recent  L TKA 1 month ago, L THA in 2024. Now s/p L THA with antibiotic beads. PMH includes BLE THA, COPD, HTN, A-fib, and anxiety.  Of note pt reports history of cardiac ablation procedure with baseline HR of 50-60 bpm.    PT Comments  Pt was finishing up education on antibiotics and HH nursing care upon arrival. She is A and O x 4. Agreeable and extremely pleasant." I'm planning to go home tomorrow." Pt was easily and safely able to exit bed, stand to RW, and tolerate ambulation x ~ 400 ft. No LOB however pt does endorse L hip pain and requested medication after session. RN aware. Pt is progressing well. Recommend OPPT at DC to continue to promote improved I'ly and safety with all ADLs.    If plan is discharge home, recommend the following: A little help with walking and/or transfers;A little help with bathing/dressing/bathroom     Equipment Recommendations  None recommended by PT       Precautions / Restrictions Precautions Precautions: Anterior Hip;Fall Precaution Booklet Issued: Yes (comment) Recall of Precautions/Restrictions: Intact Restrictions Weight Bearing Restrictions Per Provider Order: Yes LLE Weight Bearing Per Provider Order: Weight bearing as tolerated     Mobility  Bed Mobility Overal bed mobility: Modified Independent   Transfers Overall transfer level: Modified independent Equipment used: Rolling walker (2 wheels) Transfers: Sit to/from Stand Sit to Stand: Modified independent (Device/Increase time)   Ambulation/Gait Ambulation/Gait assistance: Modified independent (Device/Increase time) Gait Distance (Feet): 400 Feet Assistive device: Rolling walker (2 wheels) Gait Pattern/deviations: Step-through pattern Gait velocity: WNL  General  Gait Details: pt was safely and easily able to ambulate 2 laps ~ 400 ft with RW without LOB or safety concerns    Balance Overall balance assessment: Modified Independent     Communication Communication Communication: No apparent difficulties  Cognition Arousal: Alert Behavior During Therapy: WFL for tasks assessed/performed   PT - Cognitive impairments: No apparent impairments    PT - Cognition Comments: Pt is A and O x 4 Following commands: Intact      Cueing Cueing Techniques: Verbal cues         Pertinent Vitals/Pain Pain Assessment Pain Assessment: 0-10 Pain Score: 7  Pain Location: L LE (hip/groin area) Pain Descriptors / Indicators: Discomfort, Grimacing Pain Intervention(s): Limited activity within patient's tolerance, Monitored during session, Premedicated before session, Repositioned     PT Goals (current goals can now be found in the care plan section) Acute Rehab PT Goals Patient Stated Goal: return home Progress towards PT goals: Progressing toward goals    Frequency    7X/week       AM-PAC PT "6 Clicks" Mobility   Outcome Measure  Help needed turning from your back to your side while in a flat bed without using bedrails?: None Help needed moving from lying on your back to sitting on the side of a flat bed without using bedrails?: None Help needed moving to and from a bed to a chair (including a wheelchair)?: None Help needed standing up from a chair using your arms (e.g., wheelchair or bedside chair)?: None Help needed to walk in hospital room?: A Little Help needed climbing 3-5 steps with a railing? : A Little 6 Click Score: 22    End  of Session   Activity Tolerance: Patient tolerated treatment well Patient left: in bed;with call bell/phone within reach Nurse Communication: Mobility status PT Visit Diagnosis: Muscle weakness (generalized) (M62.81);Pain;Difficulty in walking, not elsewhere classified (R26.2) Pain - Right/Left: Left Pain -  part of body: Hip     Time: 8295-6213 PT Time Calculation (min) (ACUTE ONLY): 20 min  Charges:    $Gait Training: 8-22 mins PT General Charges $$ ACUTE PT VISIT: 1 Visit                     Jetta Lout PTA 10/20/23, 5:02 PM

## 2023-10-20 NOTE — Progress Notes (Signed)
 At patient's bedside to place PICC, after explaining the insertion, risk and benefits Mrs Amber Barnes does not want the PICC placed she would rather have her home antibiotics given through her implanted port. I have spoken with Pam at  home-health services who has informed me that they can use the implanted port for the duration of the ABT. I will access her port so she will be ready to go when she is discharged.

## 2023-10-20 NOTE — Assessment & Plan Note (Addendum)
 10-16-2023 through 10-20-2023  S/p ablation and remained in sinus rhythm. -Continue with home bisoprolol and Eliquis  10-21-2023  stable.

## 2023-10-20 NOTE — Care Management Important Message (Signed)
 Important Message  Patient Details  Name: Amber Barnes MRN: [id number] Date of Birth: Nov 14, 1950   Important Message Given:  Yes - Medicare IM     Cristela Blue, CMA 10/20/2023, 4:11 PM

## 2023-10-20 NOTE — Progress Notes (Signed)
 ID Pt doing better Worked with PT that aggravated the pain left hip Drain removed   O/e Awake and alert Patient Vitals for the past 24 hrs:  BP Temp Temp src Pulse Resp SpO2  10/20/23 1628 (!) 120/49 98.1 F (36.7 C) -- 64 18 96 %  10/20/23 0903 121/62 98.5 F (36.9 C) -- 74 17 98 %  10/20/23 0630 (!) 139/55 97.7 F (36.5 C) Oral 60 17 98 %  10/19/23 2020 (!) 117/59 97.9 F (36.6 C) -- 71 16 99 %   Chest CTA Hss1s2 Abd soft Left hip- surgical site vac present Cns non focal Labs    Latest Ref Rng & Units 10/19/2023    4:44 AM 10/18/2023    4:46 AM 10/16/2023    1:07 AM  CBC  WBC 4.0 - 10.5 K/uL 4.9  7.4  5.5   Hemoglobin 12.0 - 15.0 g/dL 8.6  9.0  16.1   Hematocrit 36.0 - 46.0 % 25.7  25.9  31.1   Platelets 150 - 400 K/uL 181  209  172        Latest Ref Rng & Units 10/20/2023    5:00 AM 10/18/2023    4:46 AM 10/16/2023    1:07 AM  CMP  Glucose 70 - 99 mg/dL  97  96   BUN 8 - 23 mg/dL  15  18   Creatinine 0.96 - 1.00 mg/dL 0.45  4.09  8.11   Sodium 135 - 145 mmol/L  136  135   Potassium 3.5 - 5.1 mmol/L  4.3  4.0   Chloride 98 - 111 mmol/L  105  103   CO2 22 - 32 mmol/L  25  23   Calcium 8.9 - 10.3 mg/dL  7.8  8.2     Micro Aspiration culture staph lugdunesis All 4 surgical cultures NG so far  Impression/recommendation  Left hip prosthetic joint infection- S/p irrigation and debridement with head and liner hip revision Cultures from the aspiration and OR is staph lugdunensis Dc ceftriaxone and vanco and change to daptomycin for convenience on discharge so as to do every day dosing and no levels to be monitored Daptomycin MIC has been sent and if it is not susceptible then will  change to vancomcyin While on dapto will reduce crestor to 5mg  ( Discussed with cardiologist)    Recent left TKA- site is healthy   H/o ca breast s/p surgery, immune therapy and radiation   Port present- she can use that for IV antibiotic   H/o afib on amoidarone Cardiac  ablation  Discussed the management with Dr.Aberman, Dr.Amin and the patient  OPAT writtent  I will follow her as OP

## 2023-10-20 NOTE — TOC Progression Note (Signed)
 Transition of Care [facility]) - Progression Note    Patient Details  Name: Amber Barnes MRN: [id number] Date of Birth: 13-Jul-1950  Transition of Care [facility]) CM/SW Contact  Liliana Cline, LCSW Phone Number: 10/20/2023, 2:42 PM  Clinical Narrative:    Per TOC handoff, patient was set up with [facility] for home IV antibiotics.  CSW called Pam with [employer]. Pam states she is doing bedside teaching today and has everything arranged for patient to DC home tomorrow. Pam states they have set up [facility] services through [place].    Expected Discharge Plan: OP Rehab Barriers to Discharge: Barriers Resolved  Expected Discharge Plan and Services   Discharge Planning Services: CM Consult   Living arrangements for the past 2 months: Single Family Home                 DME Arranged: N/A DME Agency: NA Date DME Agency Contacted: 10/18/23     HH Arranged: NA           Social Determinants of Health (SDOH) Interventions SDOH Screenings   Food Insecurity: No Food Insecurity (10/16/2023)  Housing: Low Risk  (10/16/2023)  Transportation Needs: No Transportation Needs (10/16/2023)  Utilities: Not At Risk (10/16/2023)  Recent Concern: Utilities - At Risk (09/11/2023)  Alcohol Screen: Low Risk  (07/14/2023)  Depression (PHQ2-9): Low Risk  (08/29/2023)  Financial Resource Strain: Low Risk  (08/23/2023)   Received from [facility]  Physical Activity: Insufficiently Active (07/14/2023)  Social Connections: Socially Integrated (10/16/2023)  Stress: Stress Concern Present (07/14/2023)  Tobacco Use: Medium Risk (10/17/2023)    Readmission Risk Interventions    07/13/2021    1:04 PM 04/09/2021    1:02 PM  Readmission Risk Prevention Plan  Transportation Screening Complete Complete  PCP or Specialist Appt within 3-5 Days  Complete  HRI or Home Care Consult  Complete  Social Work Consult for Recovery Care Planning/Counseling  Complete  Palliative Care Screening  Not Applicable  Medication Review  Oceanographer) Complete Complete  Palliative Care Screening Not Applicable   Skilled Nursing Facility Not Applicable

## 2023-10-20 NOTE — Progress Notes (Addendum)
 Pharmacy Antibiotic Note  Amber Barnes is a 73 y.o. female admitted on 10/16/2023 with hip prosthetic joint infection.  Pharmacy has been consulted for vancomycin dosing. She had R THA in Feb 2020, L THA Oct 2024 and L knee 09/11/2023.  She has a washout of L hip with head and liner revision on 10/17/2023.  Today, 10/20/2023 Day #1 daptomycin Renal; SCr WNL - at baseline WBC WNL Afeb Previously on ceftriaxone and vancomycin 4/8 hip aspiration cx - oxacillin resistant S. Lugdunensis 4/8 multiple OR cultures NGTD   Plan: Daptomycin 600mg  IV q24h which is 8 mg/kg - using AdjBW (77.4 kg) for BMI > 30 (39.5) Watch renal function  Serum CPK weekly x2 - monitor closely d/t concomitant statin therapy (Crestor decreased to 5 mg while on daptomycin) Monitor for myopathies, rhabdomyolysis - muscle pain, weakness, cramps, dark urine Monitor for peripheral neuropathy   Height: 5\' 5"  (165.1 cm) Weight: 108 kg (238 lb) IBW/kg (Calculated) : 57  Temp (24hrs), Avg:98 F (36.7 C), Min:97.7 F (36.5 C), Max:98.5 F (36.9 C)  Recent Labs  Lab 10/16/23 0107 10/18/23 0446 10/19/23 0444 10/20/23 0500  WBC 5.5 7.4 4.9  --   CREATININE 0.86 0.85  --  0.92    Estimated Creatinine Clearance: 69.5 mL/min (by C-G formula based on SCr of 0.92 mg/dL).    Allergies  Allergen Reactions   Hydrocodone    Prednisone Other (See Comments)    Agitation, hyperactivity, polyphagia   Amiodarone Nausea And Vomiting and Other (See Comments)   Hydrocodone-Acetaminophen Other (See Comments)    Hallucinations and combative   Tapentadol Itching   Adhesive [Tape] Itching and Rash    Prefers paper tape   Latex Itching and Rash    use paper tap   Oxycodone Itching    Can take it with benadryl   Tramadol Itching    Can take it with benadryl   Wound Dressing Adhesive     Thank you for allowing pharmacy to be a part of this patient's care.  Laqueta Jean PharmD Candidate 10/20/2023 9:40 AM  Juliette Alcide, PharmD, BCPS, BCIDP Work Cell: [phone] 10/20/2023 10:28 AM

## 2023-10-20 NOTE — Treatment Plan (Signed)
 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 Baseline Creatinine <1    Allergies  Allergen Reactions   Hydrocodone    Prednisone Other (See Comments)    Agitation, hyperactivity, polyphagia   Amiodarone Nausea And Vomiting and Other (See Comments)   Hydrocodone-Acetaminophen Other (See Comments)    Hallucinations and combative   Tapentadol Itching   Adhesive [Tape] Itching and Rash    Prefers paper tape   Latex Itching and Rash    use paper tap   Oxycodone Itching    Can take it with benadryl   Tramadol Itching    Can take it with benadryl   Wound Dressing Adhesive     OPAT Orders Discharge antibiotics: Daptomycin 8mg /kg body weight 600mg  IV every 24 hours Duration: 6 weeks End Date: 11/27/23   [facility] Care Per Protocol:  Labs weekly while on IV antibiotics: _X_ CBC with differential _X_ CK _X_ CMP   _X_ Please pull PIC at completion of IV antibiotics   Fax weekly lab results  promptly to [phone] and  [phone]  Clinic Follow Up Appt:with Dr.Beatrice Sehgal 11/14/23 at 11.45AM  Call [phone] with critical value or questions

## 2023-10-20 NOTE — Progress Notes (Signed)
 Progress Note   Patient: Amber Barnes BJY:[id number] DOB: September 08, 1950 DOA: 10/16/2023     2 DOS: the patient was seen and examined on 10/20/2023   Brief hospital course: Taken from H&P.    Claretha Townshend is a 73 y.o. female with medical history significant of multiple OA status post left hip replacement and recent left knee replacement, HTN, PAF status post ablation on Eliquis, chronic HFpEF, HLD, breast cancer status post chemo/immunotherapy/radiation and surgery, hypothyroidism, presented with worsening of left hip pain.   Patient had left hip replacement back in October 2024 and has been doing well. Lately she has had worsening of left knee pain and underwent left knee TKR and started on PT therapy 2 weeks ago and she has been doing well.   On presentation vital and labs stable.X-ray of left hip showed intact hardware,   X-ray left knee showed no acute findings.  Lumbar spine MRI showed multiple level degenerative changes but most are chronic including findings on L1-L2 L2-L3 L3-4 and L4-L5.  MRI of hip with increased size of heterogenous fluid collection along the surgical track, concerning for abscess with surrounding soft tissue and intramuscular edema.  Orthopedic surgery was consulted and left hip arthrocentesis with IR was ordered.  Antibiotics were held until cultures were taken. Also concern of L3 radiculitis.  4/8: Vitals with mildly elevated blood pressure at 155/67.  CRP significantly elevated at 17, ESR 80.  IR guided arthrocentesis with WBC of 14 749, 93% neutrophil.  Orthopedic surgery is going to take her to the OR for washout later today.  4/9: Vital stable with borderline bradycardia, s/p excisional total hip arthroplasty with placement of antibiotic beads and drain on left on 4/8.  Labs with decrease of hemoglobin to 9, likely secondary to blood loss during surgery, starting on supplement.  Preliminary cultures with no organisms seen.  Patient will be weightbearing as  tolerated on left extremity per orthopedic surgery note. Orthopedic surgery ordered UA as she was complaining of mild dysuria likely due to Foley catheter placement during surgery, it was removed earlier this morning.  Patient is able to void.  UA with mild hematuria only.  Urine cultures were also ordered by orthopedic surgery, likely just had mild cystitis/inflammation with Foley and not an infection. Started on ceftriaxone-ID was also consulted at orthopedic request.  4/10: Remained hemodynamically stable.  Urine cultures with no growth.  Multiple cultures from hip joint and only warm is growing Methicillin-resistant Staphylococcus lugdunensis-started on vancomycin   CBC with hemoglobin of 8.6-all cell line decreased. Drain was removed, patient will go home with wound VAC in place.  4/11:2/3 intraoperative cultures started growing Staphylococcus lugdunensis, ID switched antibiotics to daptomycin, PICC line was ordered and then later decided to use her existing port.  Crestor dose was decreased to 5 mg daily while taking daptomycin.  She will need 6 weeks of daptomycin with end date of 11/27/2023.  ID is arranging home antibiotic and likely can go home tomorrow.   Assessment and Plan: * Left hip pain History of significant arthritis s/p left hip and knee replacement. MRI with concern of fluid collection/abscess, s/p arthrocentesis with IR-preliminary labs with WBC of above 14,000 with 93% neutrophil.  No fever or leukocytosis S/p left hip revision arthroplasty and a drain placement on 4/8 Intraoperative cultures with  methicillin-resistant Staphylococcus Lugdunensis  - Antibiotics switched with daptomycin by ID - Patient will need 6 weeks of daptomycin with end date of 11/27/2023 -PICC line ordered but later decided to use  existing port which will be accessed before she leaves. -ID is working on home antibiotics -Wound VAC will remain in place for another 1 week-orthopedic will remove as  outpatient -Continue with supportive care and pain management  Atrial fibrillation/flutter (HCC) S/p ablation and remained in sinus rhythm -Continue with home bisoprolol and Eliquis  Dysuria Patient was complaining of some dysuria this morning, likely due to Foley placement during surgery.  Foley was removed earlier today and she is able to void.  UA with mild hematuria, likely mild cystitis/inflammation with Foley. Urine cultures negative   Hypertension Blood pressure within goal. - Continue home losartan and hydrochlorothiazide - As needed hydralazine  Acute postoperative anemia due to expected blood loss Hemoglobin at 8.6 this morning, likely secondary to blood loss during surgery. -Starting her on supplement -Monitor hemoglobin  Depression - Continue home Lexapro  Hyperlipidemia - Continue home Crestor  Chronic heart failure with preserved ejection fraction (HFpEF) (HCC) Clinically appears euvolemic. - Continue home low-dose Lasix  RLS (restless legs syndrome) - Continue home Requip   Subjective: Patient was complaining of some irritation around wound VAC site.  Per orthopedic surgery everything looks good.  Physical Exam: Vitals:   10/19/23 1621 10/19/23 2020 10/20/23 0630 10/20/23 0903  BP: (!) 131/57 (!) 117/59 (!) 139/55 121/62  Pulse: 75 71 60 74  Resp: 16 16 17 17   Temp: 97.7 F (36.5 C) 97.9 F (36.6 C) 97.7 F (36.5 C) 98.5 F (36.9 C)  TempSrc: Oral  Oral   SpO2: 100% 99% 98% 98%  Weight:      Height:       General.  Obese elderly lady, in no acute distress. Pulmonary.  Lungs clear bilaterally, normal respiratory effort. CV.  Regular rate and rhythm, no JVD, rub or murmur. Abdomen.  Soft, nontender, nondistended, BS positive. CNS.  Alert and oriented .  No focal neurologic deficit. Extremities.  No edema, no cyanosis, pulses intact and symmetrical.  Left hip with wound VAC Psychiatry.  Judgment and insight appears normal.    Data  Reviewed: Prior data reviewed  Family Communication: Discussed with patient  Disposition: Status is: Inpatient  inpatient because: Severity of illness  Planned Discharge Destination: Home  DVT prophylaxis.  Eliquis Time spent: 44 minutes  This record has been created using Conservation officer, historic buildings. Errors have been sought and corrected,but may not always be located. Such creation errors do not reflect on the standard of care.   Author: Arnetha Courser, MD 10/20/2023 3:46 PM  For on call review [email or web address].

## 2023-10-20 NOTE — Progress Notes (Signed)
 Subjective: 3 Days Post-Op Procedure(s) (LRB): EXCISIONAL TOTAL HIP ARTHROPLASTY WITH PLACEMENT OF ANTIBIOTIC BEADS AND DRAIN (Left) Patient reports pain as mild.   Patient is well, and has had no acute complaints or problems Denies any CP, SOB, ABD pain. We will continue therapy today.  + BM  Objective: Vital signs in last 24 hours: Temp:  [97.7 F (36.5 C)-97.9 F (36.6 C)] 97.7 F (36.5 C) (04/11 0630) Pulse Rate:  [60-75] 60 (04/11 0630) Resp:  [16-17] 17 (04/11 0630) BP: (117-139)/(49-59) 139/55 (04/11 0630) SpO2:  [98 %-100 %] 98 % (04/11 0630)  Intake/Output from previous day: No intake/output data recorded. Intake/Output this shift: No intake/output data recorded.  Recent Labs    10/18/23 0446 10/19/23 0444  HGB 9.0* 8.6*   Recent Labs    10/18/23 0446 10/19/23 0444  WBC 7.4 4.9  RBC 2.93* 2.76*  HCT 25.9* 25.7*  PLT 209 181   Recent Labs    10/18/23 0446 10/20/23 0500  NA 136  --   K 4.3  --   CL 105  --   CO2 25  --   BUN 15  --   CREATININE 0.85 0.92  GLUCOSE 97  --   CALCIUM 7.8*  --    No results for input(s): "LABPT", "INR" in the last 72 hours.  EXAM General - Patient is Alert, Appropriate, and Oriented Extremity - Neurovascular intact Sensation intact distally Intact pulses distally Dorsiflexion/Plantar flexion intact No cellulitis present Compartment soft Dressing - dressing C/D/I and no drainage in provena Motor Function - intact, moving foot and toes well on exam.   Past Medical History:  Diagnosis Date   Achilles tendinitis    Acute medial meniscal tear    Allergy    Anemia    Anxiety    a.) on BZO PRN (alprazolam)   Aortic atherosclerosis (HCC)    Ascending aorta dilatation (HCC) 10/20/2022   a.) TTE 10/20/2022: asc Ao 39 mm   Atrial fibrillation/flutter (HCC) 07/2016   a.) Dx'd 07/2016; b.) CHA2DS2-VASc = 5 (age, sex, HFmrEF, HTN, vasc disease history) as of 05/09/2023; c.) s/p DCCV 09/07/2016 (150 J x 2 -->  A.flutter; 200 J x 1 --> NSR); d.) s/p DCCV 10/19/2016 (150 J x 1 --> NSR); e.) brought in for DCCV 12/2016; cancelled d/t NSR; f.) s/p PVI ablation 12/12/2022; g.) cardiac rate/rhythm maintained on oral bisoprolol; chronically anticoagulated using apixaban   BMI 50.0-59.9, adult (HCC) 07/16/2018   CAD (coronary artery disease)    a.) cCTA 12/08/2022: Ca2+ = 31.6 (58th %'ile for age/sex/race match control)   Cardiomegaly    Chemotherapy induced nausea and vomiting 05/18/2021   Chronic heart failure with mid-range ejection fraction (HFmrEF) (HCC)    a.) TTE 03/08/21: EF 60-65, mild LVH, G1DD, mild LAE, triv MR; b.) TTE 07/29/21: EF 60-65, G1DD, mild LAE, triv MR; c.) TTE 11/26/21: EF 55-60, G1DD, mild LAE, mild MR; d.) TTE 02/25/22: EF 60-65, G1DD, mild LAE, mild MR; e.) TTE 05/31/22: EF 55-60, mild LVH, G2DD, mild-mod MR; f.) TTE 10/20/22: EF 45-50, mild LAE, mod red RVSF, mild-mod TR, Asc Ao 39; g.) TTE 12/13/22: EF 50-55, mild LVH, mild MAC   CKD (chronic kidney disease), stage II    Colon polyp    Complication of anesthesia    a.) emergence delirium; b.) hypoxia after hip replacement   COPD (chronic obstructive pulmonary disease) (HCC)    DDD (degenerative disc disease), lumbar    a.) s/p fusion   Depression  Endometriosis    Fibrocystic breast disease    GERD (gastroesophageal reflux disease)    History of kidney stones    History of stress test    a.) MV 07/29/2016: EF 55-65%. Small, mild defect  in mid anteroseptal, apical septal, and apical locations. No ischemia-->low risk; b.) MV 10/2022: mod sized partially rev apical ant, apical septal, and apical defect. EF 52%. No signific cor Ca2+; c.) cCTA 12/08/2022: Ca2+ = 31.6 (58th %'ile).   History of tobacco use    Hyperlipidemia    Hypertension    Hypothyroidism    a.) s/p partial thyroidectomy for nodule; pathology benign   Insomnia    Left knee pain, unspecified chronicity    Lipoma    Malignant neoplasm of upper-outer quadrant of right  female breast (HCC) 03/03/2021   a.) stage 1A invasive mammary carcinoma (cT1b or T1c N0); ER (-), PR (+), HER2/neu (+); did not tolerate adjuvant chemotherapy (paclitacel + trastuzumab) despite paclitaxel dose reduction; s/p adjuvant XRT; cinished maintenance trastuzumab 07/27/2022; no adjuvant endocrine therapy as no clear benefit   Migraine    Nephrolithiasis    On apixaban therapy    OSA (obstructive sleep apnea)    a.) non-compliant with nocturnal PAP therapy   Osteoarthritis    Osteopenia    Peptic ulcer disease    a.) H. pylori   Pericarditis 12/12/2022   a.) following A.fib ablation; resolved   Pneumonia    Port-A-Cath in place    RLS (restless legs syndrome)    a.) on ropinirole   Vitamin D deficiency     Assessment/Plan:   3 Days Post-Op Procedure(s) (LRB): EXCISIONAL TOTAL HIP ARTHROPLASTY WITH PLACEMENT OF ANTIBIOTIC BEADS AND DRAIN (Left) Principal Problem:   Left hip pain Active Problems:   Hypertension   Depression   Atrial fibrillation/flutter (HCC)   RLS (restless legs syndrome)   Hyperlipidemia   Chronic heart failure with preserved ejection fraction (HFpEF) (HCC)   Intractable pain   Lumbar radiculitis   Unable to ambulate   Dysuria   Acute postoperative anemia due to expected blood loss   Prosthetic joint infection (HCC)  Estimated body mass index is 39.61 kg/m as calculated from the following:   Height as of this encounter: 5\' 5"  (1.651 m).   Weight as of this encounter: 108 kg. Advance diet Up with therapy Pain well controlled VSS Acute post op blood loss anemia - Hgb 8.6, stable, continue with Fe supplement Appreciate ID consult - Culture showing staphylococcus lugdogensis. Awaiting intraoperative cultures to rule out contamination.  Continue with IV antibiotics per ID, Vanc and Rocephin   Patient will need follow up with [facility] ortho 7 days after discharge for provena removal and wound check.  DVT Prophylaxis - TED hose and SCDS  Eliquis Weight-Bearing as tolerated to left leg   T. Cranston Neighbor, PA-C [facility] 10/20/2023, 8:12 AM

## 2023-10-21 ENCOUNTER — Encounter: Payer: Self-pay | Admitting: Family Medicine

## 2023-10-21 ENCOUNTER — Encounter: Payer: Self-pay | Admitting: Oncology

## 2023-10-21 DIAGNOSIS — M25552 Pain in left hip: Secondary | ICD-10-CM | POA: Diagnosis not present

## 2023-10-21 DIAGNOSIS — T8450XA Infection and inflammatory reaction due to unspecified internal joint prosthesis, initial encounter: Secondary | ICD-10-CM | POA: Diagnosis not present

## 2023-10-21 DIAGNOSIS — B958 Unspecified staphylococcus as the cause of diseases classified elsewhere: Secondary | ICD-10-CM | POA: Diagnosis not present

## 2023-10-21 DIAGNOSIS — E66812 Obesity, class 2: Secondary | ICD-10-CM

## 2023-10-21 DIAGNOSIS — D62 Acute posthemorrhagic anemia: Secondary | ICD-10-CM

## 2023-10-21 MED ORDER — DAPTOMYCIN IV (FOR PTA / DISCHARGE USE ONLY)
600.0000 mg | INTRAVENOUS | 0 refills | Status: AC
Start: 2023-10-22 — End: 2023-11-27

## 2023-10-21 MED ORDER — OXYCODONE HCL 5 MG PO TABS
2.5000 mg | ORAL_TABLET | Freq: Three times a day (TID) | ORAL | Status: DC | PRN
  Administered 2023-10-21 – 2023-10-22 (×2): 2.5 mg via ORAL
  Filled 2023-10-21 (×2): qty 1

## 2023-10-21 MED ORDER — TRAMADOL HCL 50 MG PO TABS
50.0000 mg | ORAL_TABLET | Freq: Four times a day (QID) | ORAL | Status: DC | PRN
  Administered 2023-10-21: 50 mg via ORAL
  Administered 2023-10-21: 100 mg via ORAL
  Administered 2023-10-22: 50 mg via ORAL
  Filled 2023-10-21: qty 2
  Filled 2023-10-21 (×2): qty 1

## 2023-10-21 MED ORDER — OXYCODONE HCL 5 MG PO TABS: 2.5000 mg | ORAL_TABLET | Freq: Three times a day (TID) | ORAL | 0 refills | Status: DC | PRN

## 2023-10-21 MED ORDER — TRAMADOL HCL 50 MG PO TABS
50.0000 mg | ORAL_TABLET | Freq: Four times a day (QID) | ORAL | 0 refills | Status: DC | PRN
Start: 2023-10-21 — End: 2024-01-01

## 2023-10-21 NOTE — Assessment & Plan Note (Signed)
 10-21-2023 ID planned on sending pt home with IV cubicin. Pt is now refusing DC to home.

## 2023-10-21 NOTE — Progress Notes (Signed)
 Physical Therapy Treatment Patient Details Name: Amber Barnes MRN: [id number] DOB: 05/29/1951 Today's Date: 10/21/2023   History of Present Illness Pt is a 73 yo F admitted with increased pain in L hip, she has history of recent  L TKA 1 month ago, L THA in 2024. Now s/p L THA with antibiotic beads. PMH includes BLE THA, COPD, HTN, A-fib, and anxiety.  Of note pt reports history of cardiac ablation procedure with baseline HR of 50-60 bpm.    PT Comments  Overall, pt demonstrated much improved activity tolerance and safety with mobility, transfers, and gait performing at a Mod I to supervision level for longer gait distances with RW. During gait training, at the end of the first lap, pt reported sharp, cramping type pain in L Quad and had to increase weight bearing through UE s; pt remained stable.  DC recs remain appropriate to maximize her independence and safety with all ADLs.      If plan is discharge home, recommend the following: A little help with walking and/or transfers;A little help with bathing/dressing/bathroom   Can travel by private vehicle        Equipment Recommendations  None recommended by PT    Recommendations for Other Services       Precautions / Restrictions Precautions Precautions: Anterior Hip;Fall Precaution Booklet Issued: Yes (comment) Recall of Precautions/Restrictions: Intact Restrictions Weight Bearing Restrictions Per Provider Order: Yes LLE Weight Bearing Per Provider Order: Weight bearing as tolerated     Mobility  Bed Mobility Overal bed mobility: Modified Independent                  Transfers Overall transfer level: Modified independent Equipment used: Rolling walker (2 wheels) Transfers: Sit to/from Stand, Bed to chair/wheelchair/BSC Sit to Stand: Modified independent (Device/Increase time)   Step pivot transfers: Modified independent (Device/Increase time)            Ambulation/Gait Ambulation/Gait assistance:  Supervision Gait Distance (Feet): 180 Feet Assistive device: Rolling walker (2 wheels) Gait Pattern/deviations: Step-through pattern Gait velocity: decr     General Gait Details: at the end of the first lap pt reported sharp, cramping type pain in L Quad and had to increase weight bearing through UE s; remained stable.   Stairs             Wheelchair Mobility     Tilt Bed    Modified Rankin (Stroke Patients Only)       Balance Overall balance assessment: Modified Independent                                          Communication Communication Communication: No apparent difficulties  Cognition Arousal: Alert Behavior During Therapy: WFL for tasks assessed/performed   PT - Cognitive impairments: No apparent impairments                         Following commands: Intact      Cueing Cueing Techniques: Verbal cues  Exercises      General Comments        Pertinent Vitals/Pain Pain Assessment Pain Score: 7  Pain Location: L LE (hip/groin area); and top of L quad Pain Descriptors / Indicators: Sharp, Cramping Pain Intervention(s): Limited activity within patient's tolerance, Monitored during session, Premedicated before session, Ice applied    Home Living  Prior Function            PT Goals (current goals can now be found in the care plan section) Acute Rehab PT Goals Patient Stated Goal: return home PT Goal Formulation: With patient/family Time For Goal Achievement: 10/21/23 Potential to Achieve Goals: Good Progress towards PT goals: Progressing toward goals    Frequency    7X/week      PT Plan      Co-evaluation              AM-PAC PT "6 Clicks" Mobility   Outcome Measure  Help needed turning from your back to your side while in a flat bed without using bedrails?: None Help needed moving from lying on your back to sitting on the side of a flat bed without using  bedrails?: None Help needed moving to and from a bed to a chair (including a wheelchair)?: None Help needed standing up from a chair using your arms (e.g., wheelchair or bedside chair)?: None Help needed to walk in hospital room?: None Help needed climbing 3-5 steps with a railing? : A Little 6 Click Score: 23    End of Session Equipment Utilized During Treatment: Gait belt Activity Tolerance: Patient limited by pain Patient left: in chair;with call Amber/phone within reach Nurse Communication: Mobility status PT Visit Diagnosis: Muscle weakness (generalized) (M62.81);Pain;Difficulty in walking, not elsewhere classified (R26.2) Pain - Right/Left: Left Pain - part of body: Hip     Time: 1140-1205 PT Time Calculation (min) (ACUTE ONLY): 25 min  Charges:    $Gait Training: 8-22 mins $Therapeutic Activity: 8-22 mins PT General Charges $$ ACUTE PT VISIT: 1 Visit                     Eliazar Gross, PTA  10/21/23, 12:27 PM

## 2023-10-21 NOTE — Progress Notes (Signed)
 PROGRESS NOTE    Amber Barnes  ZOX:[id number] DOB: 05-Dec-1950 DOA: 10/16/2023 PCP: Calista Catching, FNP  Subjective: Pt refusing discharge from hospital. Pt walked >400 feet yesterday with PT. No further indication for hospitalization. Discussed with orthopedics.   Hospital Course: Taken from H&P.    Amber Barnes is a 73 y.o. female with medical history significant of multiple OA status post left hip replacement and recent left knee replacement, HTN, PAF status post ablation on Eliquis, chronic HFpEF, HLD, breast cancer status post chemo/immunotherapy/radiation and surgery, hypothyroidism, presented with worsening of left hip pain.   Patient had left hip replacement back in October 2024 and has been doing well. Lately she has had worsening of left knee pain and underwent left knee TKR and started on PT therapy 2 weeks ago and she has been doing well.   On presentation vital and labs stable.X-ray of left hip showed intact hardware,   X-ray left knee showed no acute findings.  Lumbar spine MRI showed multiple level degenerative changes but most are chronic including findings on L1-L2 L2-L3 L3-4 and L4-L5.  MRI of hip with increased size of heterogenous fluid collection along the surgical track, concerning for abscess with surrounding soft tissue and intramuscular edema.  Orthopedic surgery was consulted and left hip arthrocentesis with IR was ordered.  Antibiotics were held until cultures were taken. Also concern of L3 radiculitis.  4/8: Vitals with mildly elevated blood pressure at 155/67.  CRP significantly elevated at 17, ESR 80.  IR guided arthrocentesis with WBC of 14 749, 93% neutrophil.  Orthopedic surgery is going to take her to the OR for washout later today.  4/9: Vital stable with borderline bradycardia, s/p excisional total hip arthroplasty with placement of antibiotic beads and drain on left on 4/8.  Labs with decrease of hemoglobin to 9, likely secondary to blood loss  during surgery, starting on supplement.  Preliminary cultures with no organisms seen.  Patient will be weightbearing as tolerated on left extremity per orthopedic surgery note. Orthopedic surgery ordered UA as she was complaining of mild dysuria likely due to Foley catheter placement during surgery, it was removed earlier this morning.  Patient is able to void.  UA with mild hematuria only.  Urine cultures were also ordered by orthopedic surgery, likely just had mild cystitis/inflammation with Foley and not an infection. Started on ceftriaxone-ID was also consulted at orthopedic request.  4/10: Remained hemodynamically stable.  Urine cultures with no growth.  Multiple cultures from hip joint and only warm is growing Methicillin-resistant Staphylococcus lugdunensis-started on vancomycin   CBC with hemoglobin of 8.6-all cell line decreased. Drain was removed, patient will go home with wound VAC in place.  4/11:2/3 intraoperative cultures started growing Staphylococcus lugdunensis, ID switched antibiotics to daptomycin, PICC line was ordered and then later decided to use her existing port.  Crestor dose was decreased to 5 mg daily while taking daptomycin.  She will need 6 weeks of daptomycin with end date of 11/27/2023.  ID is arranging home antibiotic and likely can go home tomorrow.   Assessment and Plan: * Left hip pain 10-16-2023 through 10-20-2023 History of significant arthritis s/p left hip and knee replacement. MRI with concern of fluid collection/abscess, s/p arthrocentesis with IR-preliminary labs with WBC of above 14,000 with 93% neutrophil.  No fever or leukocytosis S/p left hip revision arthroplasty and a drain placement on 4/8 Intraoperative cultures with  methicillin-resistant Staphylococcus Lugdunensis  - Antibiotics switched with daptomycin by ID - Patient will need 6  weeks of daptomycin with end date of 11/27/2023 -PICC line ordered but later decided to use existing port which will be  accessed before she leaves. -ID is working on home antibiotics -Wound VAC will remain in place for another 1 week-orthopedic will remove as outpatient -Continue with supportive care and pain management  10-21-2023 pt refusing to be discharged. Orthopedics has seen patient. Ortho adjusting pain medications. Pt is medically stable for discharge. Since pt is refusing DC to home, will start SNF bed admission process.  Staphylococcus infection 10-21-2023 ID planned on sending pt home with IV cubicin. Pt is now refusing DC to home.  Prosthetic joint infection (HCC) 10-21-2023 ID planned on sending pt home with IV cubicin. Pt is now refusing DC to home.  Acute postoperative anemia due to expected blood loss 10-16-2023 through 10-20-2023 Hemoglobin at 8.6 this morning, likely secondary to blood loss during surgery. -Starting her on supplement. -Monitor hemoglobin  10-21-2023 stable HgB.   Dysuria 10-16-2023 through 10-20-2023 Patient was complaining of some dysuria this morning, likely due to Foley placement during surgery.  Foley was removed earlier today and she is able to void.  UA with mild hematuria, likely mild cystitis/inflammation with Foley. Urine cultures negative  10-21-2023 pt does not have a UTI.   Intractable pain 10-21-2023 pt has pain clinic contract.    Chronic heart failure with preserved ejection fraction (HFpEF) (HCC) 10-16-2023 through 10-20-2023  Clinically appears euvolemic. - Continue home low-dose Lasix  10-21-2023 stable.  Hyperlipidemia 10-16-2023 through 10-20-2023 - Continue home Crestor  10-21-2023 stable.  Obesity, Class II, BMI 35-39.9 Body mass index is 39.61 kg/m.   RLS (restless legs syndrome) 10-16-2023 through 10-20-2023 - Continue home Requip  10-21-2023 stable  Atrial fibrillation/flutter (HCC) 10-16-2023 through 10-20-2023  S/p ablation and remained in sinus rhythm. -Continue with home bisoprolol and Eliquis  10-21-2023  stable.  Depression 10-16-2023  through 10-20-2023 - Continue home Lexapro  10-21-2023 stable.  Hypertension 10-16-2023 through 10-20-2023  Blood pressure within goal. - Continue home losartan and hydrochlorothiazide - As needed hydralazine  10-21-2023 stable.  DVT prophylaxis: SCDs Start: 10/17/23 2018 apixaban (ELIQUIS) tablet 5 mg     Code Status: Full Code Family Communication: no family at bedside Disposition Plan: return home vs snf Reason for continuing need for hospitalization: pt refusing to be discharge to home. She is medically stable for DC today. Since pt is refusing DC to home, will have CM start SNF bed admission process.  Objective: Vitals:   10/20/23 1628 10/20/23 2115 10/21/23 0503 10/21/23 0726  BP: (!) 120/49 (!) 146/53 (!) 137/59 (!) 125/52  Pulse: 64 (!) 58 (!) 58   Resp: 18 16 16 16   Temp: 98.1 F (36.7 C) 98.1 F (36.7 C) 98.2 F (36.8 C) (!) 97.4 F (36.3 C)  TempSrc:  Oral Oral   SpO2: 96% 96% 96% 100%  Weight:      Height:        Intake/Output Summary (Last 24 hours) at 10/21/2023 1039 Last data filed at 10/20/2023 2154 Gross per 24 hour  Intake 305 ml  Output --  Net 305 ml   Filed Weights   10/15/23 2304  Weight: 108 kg    Examination:  Physical Exam Vitals and nursing note reviewed.  Constitutional:      Appearance: She is obese.  HENT:     Head: Normocephalic and atraumatic.     Nose: Nose normal.  Eyes:     General: No scleral icterus. Cardiovascular:  Rate and Rhythm: Normal rate and regular rhythm.  Pulmonary:     Effort: Pulmonary effort is normal.     Breath sounds: Normal breath sounds.  Abdominal:     General: Bowel sounds are normal. There is no distension.     Palpations: Abdomen is soft.     Tenderness: There is no abdominal tenderness.  Musculoskeletal:     Right lower leg: No edema.     Left lower leg: No edema.  Skin:    General: Skin is warm and dry.     Capillary Refill: Capillary refill takes less than 2 seconds.  Neurological:      Mental Status: She is alert and oriented to person, place, and time.     Data Reviewed: I have personally reviewed following labs and imaging studies  CBC: Recent Labs  Lab 10/16/23 0107 10/18/23 0446 10/19/23 0444  WBC 5.5 7.4 4.9  NEUTROABS 4.0  --   --   HGB 10.1* 9.0* 8.6*  HCT 31.1* 25.9* 25.7*  MCV 94.5 88.4 93.1  PLT 172 209 181   Basic Metabolic Panel: Recent Labs  Lab 10/16/23 0107 10/18/23 0446 10/20/23 0500  NA 135 136  --   K 4.0 4.3  --   CL 103 105  --   CO2 23 25  --   GLUCOSE 96 97  --   BUN 18 15  --   CREATININE 0.86 0.85 0.92  CALCIUM 8.2* 7.8*  --    GFR: Estimated Creatinine Clearance: 69.5 mL/min (by C-G formula based on SCr of 0.92 mg/dL). Cardiac Enzymes: Recent Labs  Lab 10/16/23 0107 10/20/23 0500  CKTOTAL 31* 56   BNP (last 3 results) Recent Labs    12/13/22 1025  BNP 76.9   Recent Results (from the past 240 hours)  Aerobic/Anaerobic Culture w Gram Stain (surgical/deep wound)     Status: None (Preliminary result)   Collection Time: 10/17/23 10:20 AM   Specimen: Joint, Hip; Fine Needle Aspirate  Result Value Ref Range Status   Specimen Description   Final    HIP LEFT Performed at [facility], [address], [place], [place] [address]    Special Requests   Final    Normal Performed at [facility], [address] [place], [place] [address]    Gram Stain   Final    ABUNDANT WBC PRESENT, PREDOMINANTLY PMN NO ORGANISMS SEEN Performed at [facility], [address], [place], [place] [address]    Culture   Final    FEW STAPHYLOCOCCUS LUGDUNENSIS Sent to [facility] for further susceptibility testing. NO ANAEROBES ISOLATED; CULTURE IN PROGRESS FOR 5 DAYS    Report Status PENDING  Incomplete   Organism ID, Bacteria STAPHYLOCOCCUS LUGDUNENSIS  Final      Susceptibility   Staphylococcus lugdunensis - MIC*    CIPROFLOXACIN <=0.5 SENSITIVE Sensitive     ERYTHROMYCIN <=0.25 SENSITIVE Sensitive      GENTAMICIN <=0.5 SENSITIVE Sensitive     OXACILLIN >=4 RESISTANT Resistant     TETRACYCLINE <=1 SENSITIVE Sensitive     VANCOMYCIN <=0.5 SENSITIVE Sensitive     TRIMETH/SULFA <=10 SENSITIVE Sensitive     CLINDAMYCIN <=0.25 SENSITIVE Sensitive     RIFAMPIN <=0.5 SENSITIVE Sensitive     Inducible Clindamycin NEGATIVE Sensitive     * FEW STAPHYLOCOCCUS LUGDUNENSIS  MIC (1 Drug)-aspiration of hip; 10/17/2023; Synovial, Left Hip; oxacillin resistant S. lugdunensis; Daptomycin     Status: None   Collection Time: 10/17/23 10:20  AM   Specimen: Synovial, Left Hip  Result Value Ref Range Status   Min Inhibitory Conc (1 Drug) Preliminary report  Final    Comment: (NOTE) Performed At: [facility] [address] [place], [place] [id number] Pearlean Botts MD CW:[id number]    Source LEFT HIP WOUND  Final    Comment: Performed at [facility], [address], [place], [place] [address]  MIC Result     Status: None   Collection Time: 10/17/23 10:20 AM  Result Value Ref Range Status   Result 1 (MIC) Comment  Final    Comment: (NOTE) Staphylococcus lugdunensis Performed At: [facility] [address] [place], [place] [id number] Pearlean Botts MD [id number]   Aerobic/Anaerobic Culture w Gram Stain (surgical/deep wound)     Status: None (Preliminary result)   Collection Time: 10/17/23  4:37 PM   Specimen: Path Tissue  Result Value Ref Range Status   Specimen Description   Final    TISSUE Performed at [facility], [address] [place], [place] [address]    Special Requests   Final    left hip joint Performed at [facility], [address] [place], [place] [address]    Gram Stain   Final    RARE WBC PRESENT, PREDOMINANTLY PMN NO ORGANISMS SEEN Performed at [facility], [address] [place], [place] [address]    Culture   Final    RARE STAPHYLOCOCCUS LUGDUNENSIS SUSCEPTIBILITIES PERFORMED ON PREVIOUS CULTURE WITHIN THE LAST 5  DAYS. NO ANAEROBES ISOLATED; CULTURE IN PROGRESS FOR 5 DAYS    Report Status PENDING  Incomplete  Aerobic/Anaerobic Culture w Gram Stain (surgical/deep wound)     Status: None (Preliminary result)   Collection Time: 10/17/23  4:39 PM   Specimen: Path Tissue  Result Value Ref Range Status   Specimen Description   Final    TISSUE Performed at [facility], [address], [place], [place] [address]    Special Requests   Final    left hip joint 2 Performed at [facility], [address] [place], [place] [address]    Gram Stain   Final    RARE WBC PRESENT, PREDOMINANTLY PMN NO ORGANISMS SEEN Performed at [facility], [address] [place], [place] [address]    Culture   Final    RARE STAPHYLOCOCCUS LUGDUNENSIS SUSCEPTIBILITIES PERFORMED ON PREVIOUS CULTURE WITHIN THE LAST 5 DAYS. NO ANAEROBES ISOLATED; CULTURE IN PROGRESS FOR 5 DAYS    Report Status PENDING  Incomplete  Aerobic/Anaerobic Culture w Gram Stain (surgical/deep wound)     Status: None (Preliminary result)   Collection Time: 10/17/23  4:43 PM   Specimen: PATH Soft tissue  Result Value Ref Range Status   Specimen Description   Final    TISSUE Performed at [facility], [address] [place] [address], [place], [place] [address]    Special Requests   Final    LEFT HIP SYNOVIUM AND CAPSULE Performed at [facility], [address] [place], [place] [address]    Gram Stain NO WBC SEEN NO ORGANISMS SEEN   Final   Culture   Final    NO GROWTH 3 DAYS NO ANAEROBES ISOLATED; CULTURE IN PROGRESS FOR 5 DAYS Performed at [facility], [address] [place], [place] [address]    Report Status PENDING  Incomplete  Aerobic/Anaerobic Culture w Gram Stain (surgical/deep wound)     Status: None (Preliminary result)   Collection  Time: 10/17/23  4:47 PM   Specimen: PATH Soft tissue  Result Value Ref Range Status   Specimen Description   Final    TISSUE Performed at [facility], [address], [place], [place] [address]    Special Requests   Final    LEFT HIPSYNOVIUM AND CAPSULE Performed at [facility], [address] [place], [place] [address]    Gram Stain   Final    RARE WBC PRESENT, PREDOMINANTLY PMN NO ORGANISMS SEEN Performed at [facility], [address], [place], [place] [address]    Culture   Final    RARE STAPHYLOCOCCUS LUGDUNENSIS SUSCEPTIBILITIES PERFORMED ON PREVIOUS CULTURE WITHIN THE LAST 5 DAYS. NO ANAEROBES ISOLATED; CULTURE IN PROGRESS FOR 5 DAYS    Report Status PENDING  Incomplete  Urine Culture     Status: None   Collection Time: 10/18/23  8:24 AM   Specimen: Urine, Clean Catch  Result Value Ref Range Status   Specimen Description   Final    URINE, CLEAN CATCH Performed at [facility], [address] [place], [place] [address]    Special Requests   Final    NONE Performed at [facility], [address] [place], [place] [address]    Culture   Final    NO GROWTH Performed at [facility], [address], [place], [place] [address]    Report Status 10/19/2023 FINAL  Final     Radiology Studies: US  EKG SITE RITE Result Date: 10/20/2023 If Site Rite image not attached, placement could not be confirmed due to current cardiac rhythm.  US  EKG SITE RITE Result Date: 10/20/2023 If Site Rite image not attached, placement could not be confirmed due to current cardiac rhythm.   Scheduled Meds:  acetaminophen  1,000 mg Oral Q8H   apixaban  5 mg Oral BID   bisoprolol  10 mg Oral BID   Chlorhexidine Gluconate Cloth  6 each Topical Daily   docusate sodium  100 mg Oral BID   escitalopram  10 mg Oral Daily   Fe Fum-Vit C-Vit B12-FA  1 capsule Oral BID   furosemide  20 mg Oral Daily   hydrochlorothiazide  6.25 mg Oral Daily   And   losartan  25 mg Oral Daily   pantoprazole  40 mg Oral Daily   potassium chloride SA  20 mEq Oral Daily   ranolazine  500 mg Oral BID   rOPINIRole   2 mg Oral QHS   rosuvastatin  5 mg Oral Daily   sodium chloride flush  10-40 mL Intracatheter Q12H   Continuous Infusions:  DAPTOmycin 600 mg (10/21/23 0915)     LOS: 3 days   Time spent: 45 minutes  Unk Garb, DO  [facility]  10/21/2023, 10:39 AM

## 2023-10-21 NOTE — Progress Notes (Signed)
 Nurse has made multiple attempts to educate on discharge and discharge teaching. Patient feels that her discharge is unsafe at this time and patient is appealing the discharge.   Charge nurse and [facility] made aware.  Provider notified via secure chat.

## 2023-10-21 NOTE — Assessment & Plan Note (Signed)
 10-16-2023 through 10-20-2023  Clinically appears euvolemic. - Continue home low-dose Lasix  10-21-2023 stable.

## 2023-10-21 NOTE — Assessment & Plan Note (Signed)
 10-16-2023 through 10-20-2023 - Continue home Lexapro  10-21-2023 stable.

## 2023-10-21 NOTE — Discharge Summary (Addendum)
 [facility] Physician Discharge Summary   Patient name: Amber Barnes  Admit date:     10/16/2023  Discharge date: 10/21/2023  Attending Physician: AMIN, SUMAYYA [[id number]]  Discharge Physician: Unk Garb   PCP: Calista Catching, FNP  Admitted From: Home Disposition:  Home  Recommendations for Outpatient Follow-up:  Follow up with PCP in 1-2 weeks Follow up with orthopedics next week for dressing change  Home Health:Yes. PT, OT, RN Equipment/Devices: Negative pressure dressing  Discharge Condition:Stable CODE STATUS:FULL Diet recommendation: Heart Healthy Fluid Restriction: None  Hospital Summary: Taken from H&P.    Amber Barnes is a 73 y.o. female with medical history significant of multiple OA status post left hip replacement and recent left knee replacement, HTN, PAF status post ablation on Eliquis, chronic HFpEF, HLD, breast cancer status post chemo/immunotherapy/radiation and surgery, hypothyroidism, presented with worsening of left hip pain.   Patient had left hip replacement back in October 2024 and has been doing well. Lately she has had worsening of left knee pain and underwent left knee TKR and started on PT therapy 2 weeks ago and she has been doing well.   On presentation vital and labs stable.X-ray of left hip showed intact hardware,   X-ray left knee showed no acute findings.  Lumbar spine MRI showed multiple level degenerative changes but most are chronic including findings on L1-L2 L2-L3 L3-4 and L4-L5.  MRI of hip with increased size of heterogenous fluid collection along the surgical track, concerning for abscess with surrounding soft tissue and intramuscular edema.  Orthopedic surgery was consulted and left hip arthrocentesis with IR was ordered.  Antibiotics were held until cultures were taken. Also concern of L3 radiculitis.  4/8: Vitals with mildly elevated blood pressure at 155/67.  CRP significantly elevated at 17, ESR 80.  IR guided  arthrocentesis with WBC of 14 749, 93% neutrophil.  Orthopedic surgery is going to take her to the OR for washout later today.  4/9: Vital stable with borderline bradycardia, s/p excisional total hip arthroplasty with placement of antibiotic beads and drain on left on 4/8.  Labs with decrease of hemoglobin to 9, likely secondary to blood loss during surgery, starting on supplement.  Preliminary cultures with no organisms seen.  Patient will be weightbearing as tolerated on left extremity per orthopedic surgery note. Orthopedic surgery ordered UA as she was complaining of mild dysuria likely due to Foley catheter placement during surgery, it was removed earlier this morning.  Patient is able to void.  UA with mild hematuria only.  Urine cultures were also ordered by orthopedic surgery, likely just had mild cystitis/inflammation with Foley and not an infection. Started on ceftriaxone-ID was also consulted at orthopedic request.  4/10: Remained hemodynamically stable.  Urine cultures with no growth.  Multiple cultures from hip joint and only warm is growing Methicillin-resistant Staphylococcus lugdunensis-started on vancomycin   CBC with hemoglobin of 8.6-all cell line decreased. Drain was removed, patient will go home with wound VAC in place.  4/11:2/3 intraoperative cultures started growing Staphylococcus lugdunensis, ID switched antibiotics to daptomycin, PICC line was ordered and then later decided to use her existing port.  Crestor dose was decreased to 5 mg daily while taking daptomycin.  She will need 6 weeks of daptomycin with end date of 11/27/2023.  ID is arranging home antibiotic and likely can go home tomorrow.  Hospital Course by Problem: * Left hip pain 10-16-2023 through 10-20-2023 History of significant arthritis s/p left hip and knee replacement. MRI with concern of  fluid collection/abscess, s/p arthrocentesis with IR-preliminary labs with WBC of above 14,000 with 93% neutrophil.  No fever  or leukocytosis S/p left hip revision arthroplasty and a drain placement on 4/8 Intraoperative cultures with  methicillin-resistant Staphylococcus Lugdunensis  - Antibiotics switched with daptomycin by ID - Patient will need 6 weeks of daptomycin with end date of 11/27/2023 -PICC line ordered but later decided to use existing port which will be accessed before she leaves. -ID is working on home antibiotics -Wound VAC will remain in place for another 1 week-orthopedic will remove as outpatient -Continue with supportive care and pain management  10-21-2023 pt refusing to be discharged. Orthopedics has seen patient. Ortho adjusting pain medications. Pt is medically stable for discharge. Since pt is refusing DC to home, will start SNF bed admission process.  **update. Pt is now agreeable to being discharge to home. Home health PT, OT are re-ordered. Initial order for home health PT/OT placed on 10-17-2023. Will re-order home health PT/OT and add RN to look at negative pressure dressing and to monitor her closed surgical wound.  Staphylococcus infection 10-21-2023 ID planned on sending pt home with IV cubicin. Pt is now refusing DC to home.  Prosthetic joint infection (HCC) 10-21-2023 ID planned on sending pt home with IV cubicin. Pt is now refusing DC to home.  Acute postoperative anemia due to expected blood loss 10-16-2023 through 10-20-2023 Hemoglobin at 8.6 this morning, likely secondary to blood loss during surgery. -Starting her on supplement. -Monitor hemoglobin  10-21-2023 stable HgB.   Dysuria 10-16-2023 through 10-20-2023 Patient was complaining of some dysuria this morning, likely due to Foley placement during surgery.  Foley was removed earlier today and she is able to void.  UA with mild hematuria, likely mild cystitis/inflammation with Foley. Urine cultures negative  10-21-2023 pt does not have a UTI.   Intractable pain 10-21-2023 pt has pain clinic contract.    Chronic heart  failure with preserved ejection fraction (HFpEF) (HCC) 10-16-2023 through 10-20-2023  Clinically appears euvolemic. - Continue home low-dose Lasix  10-21-2023 stable.  Hyperlipidemia 10-16-2023 through 10-20-2023 - Continue home Crestor  10-21-2023 stable.  Obesity, Class II, BMI 35-39.9 Body mass index is 39.61 kg/m.   RLS (restless legs syndrome) 10-16-2023 through 10-20-2023 - Continue home Requip  10-21-2023 stable  Atrial fibrillation/flutter (HCC) 10-16-2023 through 10-20-2023  S/p ablation and remained in sinus rhythm. -Continue with home bisoprolol and Eliquis  10-21-2023  stable.  Depression 10-16-2023 through 10-20-2023 - Continue home Lexapro  10-21-2023 stable.  Hypertension 10-16-2023 through 10-20-2023  Blood pressure within goal. - Continue home losartan and hydrochlorothiazide - As needed hydralazine  10-21-2023 stable.    Discharge Diagnoses:  Principal Problem:   Left hip pain Active Problems:   Prosthetic joint infection (HCC)   Staphylococcus infection   Hypertension   Depression   Atrial fibrillation/flutter (HCC)   RLS (restless legs syndrome)   Obesity, Class II, BMI 35-39.9   Hyperlipidemia   Chronic heart failure with preserved ejection fraction (HFpEF) (HCC)   Intractable pain   Dysuria   Acute postoperative anemia due to expected blood loss   Lumbar radiculitis   Unable to ambulate   Discharge Instructions  Discharge Instructions     [facility] pharmacist to adjust dose for Vancomycin, Aminoglycosides and other anti-infective therapies as requested by physician.   Complete by: As directed    [facility] to provide Cath Flo 2mg    Complete by: As directed    Administer for PICC  line occlusion and as ordered by physician for other access device issues.   Anaphylaxis Kit: Provided to treat any anaphylactic reaction to the medication being provided to the patient if First Dose or when requested by physician   Complete by: As  directed    Epinephrine 1mg /ml vial / amp: Administer 0.3mg  (0.72ml) subcutaneously once for moderate to severe anaphylaxis, nurse to call physician and pharmacy when reaction occurs and call 911 if needed for immediate care   Diphenhydramine 50mg /ml IV vial: Administer 25-50mg  IV/IM PRN for first dose reaction, rash, itching, mild reaction, nurse to call physician and pharmacy when reaction occurs   Sodium Chloride 0.9% NS IV: Administer if needed for hypovolemic blood pressure drop or as ordered by physician after call to physician with anaphylactic reaction   Call MD for:  difficulty breathing, headache or visual disturbances   Complete by: As directed    Call MD for:  extreme fatigue   Complete by: As directed    Call MD for:  hives   Complete by: As directed    Call MD for:  persistant dizziness or light-headedness   Complete by: As directed    Call MD for:  persistant nausea and vomiting   Complete by: As directed    Call MD for:  redness, tenderness, or signs of infection (pain, swelling, redness, odor or green/yellow discharge around incision site)   Complete by: As directed    Call MD for:  severe uncontrolled pain   Complete by: As directed    Call MD for:  temperature >100.4   Complete by: As directed    Change dressing on IV access line weekly and PRN   Complete by: As directed    Diet - low sodium heart healthy   Complete by: As directed    Discharge instructions   Complete by: As directed    1. Follow up with your primary care provider in 1-2 weeks following discharge from hospital. 2. Follow up with orthopedics as scheduled. Call for an appointment if you do not hear from orthopedic office by Wednesday October 25, 2023   Discharge wound care:   Complete by: As directed    Keep negative pressure dressing intact until seen by orthopedics   Flush IV access with Sodium Chloride 0.9% and Heparin 10 units/ml or 100 units/ml   Complete by: As directed    Home infusion  instructions - [facility]   Complete by: As directed    Instructions: Flush IV access with Sodium Chloride 0.9% and Heparin 10units/ml or 100units/ml   Change dressing on IV access line: Weekly and PRN   Instructions Cath Flo 2mg : Administer for PICC Line occlusion and as ordered by physician for other access device   [facility] pharmacist to adjust dose for: Vancomycin, Aminoglycosides and other anti-infective therapies as requested by physician   Increase activity slowly   Complete by: As directed    Method of administration may be changed at the discretion of home infusion pharmacist based upon assessment of the patient and/or caregiver's ability to self-administer the medication ordered   Complete by: As directed       Allergies as of 10/21/2023       Reactions   Hydrocodone    Prednisone Other (See Comments)   Agitation, hyperactivity, polyphagia   Amiodarone Nausea And Vomiting, Other (See Comments)   Hydrocodone-acetaminophen Other (See Comments)   Hallucinations and combative   Tapentadol Itching   Adhesive [tape] Itching, Rash  Prefers paper tape   Latex Itching, Rash   use paper tap   Oxycodone Itching   Can take it with benadryl   Tramadol Itching   Can take it with benadryl   Wound Dressing Adhesive         Medication List     PAUSE taking these medications    rosuvastatin 40 MG tablet Wait to take this until your doctor or other care provider tells you to start again. Hold until you have completed your IV antibiotics Commonly known as: CRESTOR TAKE 1 TABLET BY MOUTH EVERY DAY       TAKE these medications    acetaminophen 500 MG tablet Commonly known as: TYLENOL Take 2 tablets (1,000 mg total) by mouth every 8 (eight) hours.   ALPRAZolam 0.25 MG tablet Commonly known as: XANAX TAKE 1 TABLET BY MOUTH TWICE A DAY AS NEEDED FOR ANXIETY   bisoprolol 10 MG tablet Commonly known as: ZEBETA Take 1 tablet (10 mg total) by mouth  2 (two) times daily.   busPIRone 5 MG tablet Commonly known as: BUSPAR TAKE 1 TABLET BY MOUTH THREE TIMES A DAY AS NEEDED   CALCIUM 500 PO Take 1 tablet by mouth daily.   daptomycin IVPB Commonly known as: CUBICIN Inject 600 mg into the vein daily. Indication:  Methicillin-resistant S. Lugdunensis hip prosthetic joint infection First Dose: Yes Last Day of Therapy:  11/27/2023 Labs - Once weekly:  CBC/D, CMP, and CPK Fax weekly lab results  promptly to [phone] and  [phone] Method of administration: IV Push Method of administration may be changed at the discretion of home infusion pharmacist based upon assessment of the patient and/or caregiver's ability to self-administer the medication ordered. Please pull PIC at completion of IV antibiotics Call [phone] with critical value or questions Start taking on: October 22, 2023   docusate sodium 100 MG capsule Commonly known as: COLACE Take 1 capsule (100 mg total) by mouth 2 (two) times daily.   Eliquis 5 MG Tabs tablet Generic drug: apixaban TAKE 1 TABLET BY MOUTH TWICE A DAY   escitalopram 10 MG tablet Commonly known as: LEXAPRO Take 1 tablet (10 mg total) by mouth daily.   furosemide 20 MG tablet Commonly known as: LASIX Take 1 tablet (20 mg total) by mouth daily. May take additional 20mg  PO prn for leg swelling   losartan-hydrochlorothiazide 50-12.5 MG tablet Commonly known as: HYZAAR Take 0.5 tablets by mouth daily as needed (for BP 140/80).   multivitamin tablet Take 1 tablet by mouth daily.   ondansetron 4 MG tablet Commonly known as: ZOFRAN Take 1 tablet (4 mg total) by mouth every 6 (six) hours as needed for nausea.   oxyCODONE 5 MG immediate release tablet Commonly known as: Roxicodone Take 0.5 tablets (2.5 mg total) by mouth every 8 (eight) hours as needed for breakthrough pain. What changed:  how much to take reasons to take this   potassium chloride SA 20 MEQ tablet Commonly known as:  KLOR-CON M Take 1 tablet (20 mEq total) by mouth daily. What changed:  when to take this additional instructions   ranolazine 500 MG 12 hr tablet Commonly known as: RANEXA TAKE 1 TABLET BY MOUTH TWICE A DAY   rOPINIRole 2 MG tablet Commonly known as: REQUIP Take 2 mg by mouth at bedtime.   traMADol 50 MG tablet Commonly known as: ULTRAM Take 1-2 tablets (50-100 mg total) by mouth every 6 (six) hours as needed for moderate pain (pain score 4-6).  What changed: how much to take   Vitamin D3 125 MCG (5000 UT) Caps Take 5,000 Units by mouth daily.               Discharge Care Instructions  (From admission, onward)           Start     Ordered   10/21/23 0000  Change dressing on IV access line weekly and PRN  (Home infusion instructions - [facility] )        10/21/23 1152   10/21/23 0000  Discharge wound care:       Comments: Keep negative pressure dressing intact until seen by orthopedics   10/21/23 1154            Follow-up Information     Venus Ginsberg, MD Follow up on 10/27/2023.   Specialty: Orthopedic Surgery Contact information: [address] [place] [address] [phone]                Allergies  Allergen Reactions   Hydrocodone    Prednisone Other (See Comments)    Agitation, hyperactivity, polyphagia   Amiodarone Nausea And Vomiting and Other (See Comments)   Hydrocodone-Acetaminophen Other (See Comments)    Hallucinations and combative   Tapentadol Itching   Adhesive [Tape] Itching and Rash    Prefers paper tape   Latex Itching and Rash    use paper tap   Oxycodone Itching    Can take it with benadryl   Tramadol Itching    Can take it with benadryl   Wound Dressing Adhesive     Discharge Exam: Vitals:   10/21/23 0726 10/21/23 1412  BP: (!) 125/52 120/63  Pulse:  62  Resp: 16 18  Temp: (!) 97.4 F (36.3 C) (!) 97.4 F (36.3 C)  SpO2: 100% 99%    Physical Exam Vitals and nursing note  reviewed.  Constitutional:      Appearance: She is obese.  HENT:     Head: Normocephalic and atraumatic.     Nose: Nose normal.  Eyes:     General: No scleral icterus. Cardiovascular:     Rate and Rhythm: Normal rate and regular rhythm.  Pulmonary:     Effort: Pulmonary effort is normal.     Breath sounds: Normal breath sounds.  Abdominal:     General: Bowel sounds are normal. There is no distension.     Palpations: Abdomen is soft.     Tenderness: There is no abdominal tenderness.  Musculoskeletal:     Right lower leg: No edema.     Left lower leg: No edema.  Skin:    General: Skin is warm and dry.     Capillary Refill: Capillary refill takes less than 2 seconds.  Neurological:     Mental Status: She is alert and oriented to person, place, and time.     The results of significant diagnostics from this hospitalization (including imaging, microbiology, ancillary and laboratory) are listed below for reference.    Microbiology: Recent Results (from the past 240 hours)  Aerobic/Anaerobic Culture w Gram Stain (surgical/deep wound)     Status: None (Preliminary result)   Collection Time: 10/17/23 10:20 AM   Specimen: Joint, Hip; Fine Needle Aspirate  Result Value Ref Range Status   Specimen Description   Final    HIP LEFT Performed at [facility], [address], [place], [place] [address]    Special Requests   Final    Normal Performed at  [facility], [address], [place], [place] [address]    Gram Stain   Final    ABUNDANT WBC PRESENT, PREDOMINANTLY PMN NO ORGANISMS SEEN Performed at [facility], [address] [place], [place] [address]    Culture   Final    FEW STAPHYLOCOCCUS LUGDUNENSIS Sent to [facility] for further susceptibility testing. NO ANAEROBES ISOLATED; CULTURE IN PROGRESS FOR 5 DAYS    Report Status PENDING  Incomplete   Organism ID, Bacteria STAPHYLOCOCCUS LUGDUNENSIS  Final      Susceptibility   Staphylococcus  lugdunensis - MIC*    CIPROFLOXACIN <=0.5 SENSITIVE Sensitive     ERYTHROMYCIN <=0.25 SENSITIVE Sensitive     GENTAMICIN <=0.5 SENSITIVE Sensitive     OXACILLIN >=4 RESISTANT Resistant     TETRACYCLINE <=1 SENSITIVE Sensitive     VANCOMYCIN <=0.5 SENSITIVE Sensitive     TRIMETH/SULFA <=10 SENSITIVE Sensitive     CLINDAMYCIN <=0.25 SENSITIVE Sensitive     RIFAMPIN <=0.5 SENSITIVE Sensitive     Inducible Clindamycin NEGATIVE Sensitive     * FEW STAPHYLOCOCCUS LUGDUNENSIS  MIC (1 Drug)-aspiration of hip; 10/17/2023; Synovial, Left Hip; oxacillin resistant S. lugdunensis; Daptomycin     Status: None   Collection Time: 10/17/23 10:20 AM   Specimen: Synovial, Left Hip  Result Value Ref Range Status   Min Inhibitory Conc (1 Drug) Preliminary report  Final    Comment: (NOTE) Performed At: [facility] [employer] [address] [place], [place] [id number] Pearlean Botts MD [id number]    Source LEFT HIP WOUND  Final    Comment: Performed at [facility], [address], [place], [place] [address]  MIC Result     Status: None   Collection Time: 10/17/23 10:20 AM  Result Value Ref Range Status   Result 1 (MIC) Comment  Final    Comment: (NOTE) Staphylococcus lugdunensis Performed At: [facility] [address] [place], [place] [id number] Pearlean Botts MD KG:[id number]   Aerobic/Anaerobic Culture w Gram Stain (surgical/deep wound)     Status: None (Preliminary result)   Collection Time: 10/17/23  4:37 PM   Specimen: Path Tissue  Result Value Ref Range Status   Specimen Description   Final    TISSUE Performed at [facility], [address] [place], [place] [address]    Special Requests   Final    left hip joint Performed at [facility], [address] [place], [place] [address]    Gram Stain   Final    RARE WBC PRESENT, PREDOMINANTLY PMN NO ORGANISMS SEEN Performed at [facility], [address] [place], [place] [address]    Culture    Final    RARE STAPHYLOCOCCUS LUGDUNENSIS SUSCEPTIBILITIES PERFORMED ON PREVIOUS CULTURE WITHIN THE LAST 5 DAYS. NO ANAEROBES ISOLATED; CULTURE IN PROGRESS FOR 5 DAYS    Report Status PENDING  Incomplete  Aerobic/Anaerobic Culture w Gram Stain (surgical/deep wound)     Status: None (Preliminary result)   Collection Time: 10/17/23  4:39 PM   Specimen: Path Tissue  Result Value Ref Range Status   Specimen Description   Final    TISSUE Performed at [facility], [address] [place], [place] [address]    Special Requests   Final    left hip joint 2 Performed at [facility], [address] [place], [place] [address]    Gram Stain   Final    RARE WBC PRESENT, PREDOMINANTLY PMN NO ORGANISMS SEEN Performed  at [facility], [address], [place], [place] [address]    Culture   Final    RARE STAPHYLOCOCCUS LUGDUNENSIS SUSCEPTIBILITIES PERFORMED ON PREVIOUS CULTURE WITHIN THE LAST 5 DAYS. NO ANAEROBES ISOLATED; CULTURE IN PROGRESS FOR 5 DAYS    Report Status PENDING  Incomplete  Aerobic/Anaerobic Culture w Gram Stain (surgical/deep wound)     Status: None (Preliminary result)   Collection Time: 10/17/23  4:43 PM   Specimen: PATH Soft tissue  Result Value Ref Range Status   Specimen Description   Final    TISSUE Performed at [facility], [address] [place], [place] [address]    Special Requests   Final    LEFT HIP SYNOVIUM AND CAPSULE Performed at [facility], [address] [place], [place] [address]    Gram Stain NO WBC SEEN NO ORGANISMS SEEN   Final   Culture   Final    NO GROWTH 4 DAYS NO ANAEROBES ISOLATED; CULTURE IN PROGRESS FOR 5 DAYS Performed at [facility], [address], [place], [place] [address]    Report Status PENDING  Incomplete  Aerobic/Anaerobic Culture w Gram Stain (surgical/deep wound)     Status: None (Preliminary result)   Collection Time: 10/17/23  4:47 PM   Specimen: PATH Soft tissue   Result Value Ref Range Status   Specimen Description   Final    TISSUE Performed at [facility], [address] [place], [place] [address]    Special Requests   Final    LEFT HIPSYNOVIUM AND CAPSULE Performed at [facility], [address] [place], [place] [address]    Gram Stain   Final    RARE WBC PRESENT, PREDOMINANTLY PMN NO ORGANISMS SEEN Performed at [facility], [address], [place], [place] [address]    Culture   Final    RARE STAPHYLOCOCCUS LUGDUNENSIS SUSCEPTIBILITIES PERFORMED ON PREVIOUS CULTURE WITHIN THE LAST 5 DAYS. NO ANAEROBES ISOLATED; CULTURE IN PROGRESS FOR 5 DAYS    Report Status PENDING  Incomplete  Urine Culture     Status: None   Collection Time: 10/18/23  8:24 AM   Specimen: Urine, Clean Catch  Result Value Ref Range Status   Specimen Description   Final    URINE, CLEAN CATCH Performed at [facility], [address], [place], [place] [address]    Special Requests   Final    NONE Performed at [facility], [address] [place], [place] [address]    Culture   Final    NO GROWTH Performed at [facility], [address], [place], [place] [address]    Report Status 10/19/2023 FINAL  Final     Labs: BNP (last 3 results) Recent Labs    12/13/22 1025  BNP 76.9   Basic Metabolic Panel: Recent Labs  Lab 10/16/23 0107 10/18/23 0446 10/20/23 0500  NA 135 136  --   K 4.0 4.3  --   CL 103 105  --   CO2 23 25  --   GLUCOSE 96 97  --   BUN 18 15  --   CREATININE 0.86 0.85 0.92  CALCIUM 8.2* 7.8*  --    CBC: Recent Labs  Lab 10/16/23 0107 10/18/23 0446 10/19/23 0444  WBC 5.5 7.4 4.9  NEUTROABS 4.0  --   --   HGB 10.1* 9.0* 8.6*  HCT 31.1* 25.9* 25.7*  MCV 94.5 88.4 93.1  PLT 172  209 181   Cardiac Enzymes: Recent Labs  Lab 10/16/23 0107 10/20/23 0500  CKTOTAL 31* 56   Urinalysis    Component Value Date/Time   COLORURINE YELLOW (A) 10/18/2023 0824    APPEARANCEUR HAZY (A) 10/18/2023 0824   LABSPEC 1.023 10/18/2023 0824   PHURINE 5.0 10/18/2023 0824   GLUCOSEU NEGATIVE 10/18/2023 0824   HGBUR MODERATE (A) 10/18/2023 0824   BILIRUBINUR NEGATIVE 10/18/2023 0824   KETONESUR NEGATIVE 10/18/2023 0824   PROTEINUR NEGATIVE 10/18/2023 0824   NITRITE NEGATIVE 10/18/2023 0824   LEUKOCYTESUR NEGATIVE 10/18/2023 0824   Sepsis Labs Recent Labs  Lab 10/16/23 0107 10/18/23 0446 10/19/23 0444  WBC 5.5 7.4 4.9    Procedures/Studies: US  EKG SITE RITE Result Date: 10/20/2023 If Site Rite image not attached, placement could not be confirmed due to current cardiac rhythm.  US  EKG SITE RITE Result Date: 10/20/2023 If Site Rite image not attached, placement could not be confirmed due to current cardiac rhythm.  DG HIP UNILAT WITH PELVIS 2-3 VIEWS LEFT Result Date: 10/18/2023 CLINICAL DATA:  Elective surgery. EXAM: DG HIP (WITH OR WITHOUT PELVIS) 2-3V LEFT COMPARISON:  None Available. FINDINGS: Two fluoroscopic spot views of the pelvis and left hip obtained in the operating room. Sequential images during hip arthroplasty. Fluoroscopy time 1 second. Dose 0.2841 mGy. IMPRESSION: Intraoperative fluoroscopy during left hip arthroplasty. Electronically Signed   By: Chadwick Colonel M.D.   On: 10/18/2023 00:06   DG C-Arm 1-60 Min-No Report Result Date: 10/17/2023 Fluoroscopy was utilized by the requesting physician.  No radiographic interpretation.   CT ASPIRATION N/S Result Date: 10/17/2023 INDICATION: Status post left hip arthroplasty on 05/11/2023. The patient has developed increasing and now intractable left hip pain with MRI demonstrating fluid collection anterior to the neck of the femoral component of prosthesis in the proximal thigh. The patient presents for attempted aspiration of fluid under CT guidance. EXAM: CT-GUIDED ASPIRATION OF LEFT LOWER EXTREMITY FLUID COLLECTION MEDICATIONS: None ANESTHESIA/SEDATION: Moderate (conscious) sedation was employed  during this procedure. A total of Versed 2.0 mg and Fentanyl 50 mcg was administered intravenously by the radiology nurse. Total intra-service moderate Sedation Time: 15 minutes. The patient's level of consciousness and vital signs were monitored continuously by radiology nursing throughout the procedure under my direct supervision. COMPLICATIONS: None immediate. PROCEDURE: Informed written consent was obtained from the patient after a thorough discussion of the procedural risks, benefits and alternatives. All questions were addressed. Maximal Sterile Barrier Technique was utilized including caps, mask, sterile gowns, sterile gloves, sterile drape, hand hygiene and skin antiseptic. A timeout was performed prior to the initiation of the procedure. After localizing region of soft tissue prominence of the anterior left hip/proximal thigh seen by MRI, overlying skin was prepped with chlorhexidine and local anesthesia provided with 1% lidocaine. An 18 gauge trocar needle was then advanced under CT guidance into 2 separate locations and aspiration performed. Total fluid volume aspirated was combined with 1 mL of sterile saline and sent for culture analysis and cell count. FINDINGS: Soft tissue prominence having fluid signal intensity seen by MRI and of roughly triangular shape just anterior to, and abutting, the neck portion of the femoral component of the left total hip arthroplasty was targeted. Aspiration yielded approximately 1 mL of turbid/purulent fluid. IMPRESSION: CT-guided aspiration of fluid anterior, and abutting, the neck portion of the left total hip arthroplasty yielded approximately 1 mL of turbid/purulent fluid. This was combined with an additional 1 mL of sterile saline and sent for culture analysis and  cell count. Electronically Signed   By: Erica Hau M.D.   On: 10/17/2023 10:52   MR HIP LEFT WO CONTRAST Result Date: 10/16/2023 CLINICAL DATA:  Increasing left hip pain. History of left hip  replacement in October 2024. EXAM: MR OF THE LEFT HIP WITHOUT CONTRAST TECHNIQUE: Multiplanar, multisequence MR imaging was performed. No intravenous contrast was administered. COMPARISON:  MRI of the left hip dated 07/31/2023. Left hip radiographs dated 10/15/2023. FINDINGS: Bone Status post bilateral hip arthroplasties with associated susceptibility artifact obscuring the adjacent osseous structures and soft tissue. Increased size of a fluid collection along the surgical tract within the left proximal anterior thigh, deep to the incision site, currently measuring 7.5 x 3.4 x 6.2 cm (series 11, image 33 and series 10, image 32), previously 2.3 x 1.2 x 5 cm. This irregular lobulated collection demonstrates heterogenous fluid signal intensity and insinuates between the overlying tensor fascia lata and rectus femoris muscles and between the vastus lateralis and iliopsoas muscle. There is surrounding soft tissue and intramuscular edema. This collection extends towards the level of the left prosthetic hip joint. Additional smaller rounded heterogenous collection in the proximal left vastus lateralis muscle measuring 1.2 x 1.0 x 1.4 cm (series 11, image 35). There is marrow edema of the left greater trochanter. SI joints are unremarkable. Soft tissue and Muscle: Postsurgical changes the left hip with soft tissue fluid collections, as described above. Enlarged left external iliac chain node measures 12 mm in short axis. Several additional prominent left pelvic and inguinal nodes are likely reactive. Visualized intrapelvic contents are grossly unremarkable. IMPRESSION: 1. Increased size of a 7.5 x 6.2 x 3.4 cm heterogenous fluid collection along the surgical tract within the left proximal anterior thigh, deep to the incision site. This is concerning for abscess given the presence of surrounding soft tissue and intramuscular edema, although, sterility remains indeterminate. The collection insinuates between the anterior  compartment musculature of the left proximal thigh and extends towards the level of the left prosthetic hip joint, septic joint can not be excluded. Additional smaller 1.4 x 1.2 x 1.0 cm fluid collection in the proximal left vastus lateralis muscle. 2. Left total hip arthroplasty with associated susceptibility artifact obscuring the adjacent osseous structures and soft tissue. There is marrow edema of the left greater trochanter, which may be reactive, however, osteomyelitis can not be excluded. 3. Enlarged and prominent left pelvic and inguinal lymph nodes, likely reactive. Electronically Signed   By: Mannie Seek M.D.   On: 10/16/2023 12:15   MR LUMBAR SPINE WO CONTRAST Result Date: 10/16/2023 CLINICAL DATA:  73 year old female with low back pain, left side pain radiating to the hip. EXAM: MRI LUMBAR SPINE WITHOUT CONTRAST TECHNIQUE: Multiplanar, multisequence MR imaging of the lumbar spine was performed. No intravenous contrast was administered. COMPARISON:  Lumbar MRI 02/23/2018. CT Abdomen and Pelvis 07/12/2021. FINDINGS: Segmentation: Normal segmentation on the CT comparison, same numbering system as on the 2019 MRI. Alignment: Generally stable lumbar lordosis since 2019. mild chronic retrolisthesis at L1-L2 and L2-L3 is stable. Similar new retrolisthesis at L3-L4. Subtle chronic anterolisthesis at L4-L5 is stable. Mild dextroconvex chronic lumbar scoliosis. Vertebrae: Normal background bone marrow signal. Stable vertebral height since 2019. Chronic degenerative endplate changes with a mix of both acute and chronic degenerative marrow signal changes including mild marrow edema inferiorly at the L1 endplate, at the left L4 superior endplate. Occasional benign vertebral hemangiomas (no follow-up imaging recommended). Intact visible sacrum and SI joints. Chronic postoperative changes as detailed below. Conus  medullaris and cauda equina: Conus extends to the L1 level. No lower spinal cord or conus signal  abnormality. Paraspinal and other soft tissues: Chronic benign renal cysts (no follow-up imaging recommended). Negative visible other abdominal viscera. Chronic mild postoperative changes to the posterior paraspinal soft tissues. Disc levels: Visible lower thoracic levels through T12-L1:  Negative. L1-L2: Progressed disc space loss since 2019. Circumferential disc osteophyte complex asymmetric to the right. Mild facet and ligament flavum hypertrophy. Epidural lipomatosis. New mild spinal stenosis. Mild left and moderate right L1 foraminal stenosis have increased. L2-L3: Progressed disc space loss with circumferential disc osteophyte complex asymmetric to the right. Mild facet and ligament flavum hypertrophy. Moderate new spinal stenosis (series 8, image 13). Moderate right lateral recess stenosis is new (right L3 nerve level). Moderate to severe right L2 foraminal stenosis has increased. Stable mild left foraminal stenosis. L3-L4: Posterior decompression, laminectomy in the midline since 2019. Severe residual facet and ligament flavum hypertrophy greater on the left. Circumferential disc osteophyte complex asymmetric to the left. Mild residual spinal and lateral recess stenosis. Severe left L3 foraminal stenosis is largely new since 2019 (series 5, image 14). A small posterior synovial cyst just below the disc space does not meaningfully contribute to stenosis (series 6, image 10). Right L3 foraminal stenosis is mild. L4-L5: Chronic grade 1 anterolisthesis. Chronic midline posterior decompression. Stable moderate facet hypertrophy greater on the left. No stenosis. L5-S1: Disc space loss with similar chronic circumferential disc osteophyte complex asymmetric to the right. Mild facet and ligament flavum hypertrophy. No spinal or lateral recess stenosis. Mild to moderate bilateral L5 foraminal stenosis is stable. IMPRESSION: 1. Stable chronic lower lumbar spine degeneration at L4-L5 and L5-S1, the former chronically  decompressed posteriorly. 2. Midline posterior L3-L4 posterior decompression since 2019, but severe residual facet and posterior element hypertrophy greater on the left. Severe new left foraminal stenosis, query left L3 radiculitis. Mild residual spinal and lateral recess stenosis. 3. Progressed degeneration and spinal stenosis also at L1-L2 and L2-L3 since 2019, moderate at the latter. Increased right greater than left neural foraminal stenosis at those levels. Electronically Signed   By: Marlise Simpers M.D.   On: 10/16/2023 05:33   DG Hip Unilat W or Wo Pelvis 2-3 Views Left Result Date: 10/15/2023 CLINICAL DATA:  pain EXAM: DG HIP (WITH OR WITHOUT PELVIS) 2-3V LEFT COMPARISON:  MRI left hip 08/08/2023 trauma x-ray left hip 05/11/2023 FINDINGS: Total bilateral hip arthroplasty. No radiographic findings to suggest surgical hardware complication. There is no evidence of hip fracture or dislocation. No acute displaced fracture or diastasis of the bones of the pelvis. There is no evidence of arthropathy or other focal bone abnormality. IMPRESSION: Negative for acute traumatic injury. Total bilateral hip arthroplasty. Electronically Signed   By: Morgane  Naveau M.D.   On: 10/15/2023 23:34    Time coordinating discharge: 50 mins  SIGNED:  Unk Garb, DO [facility] 10/21/23, 2:53 PM

## 2023-10-21 NOTE — TOC Transition Note (Signed)
 Transition of Care [facility]) - Discharge Note   Patient Details  Name: Amber Barnes MRN: [id number] Date of Birth: 1951-01-04  Transition of Care [facility]) CM/SW Contact:  Zoe Hinds, RN Phone Number: 10/21/2023, 2:31 PM   Clinical Narrative:     This CM spoke with pt and healthcare team regarding pt's DC plan and updated pt is medically cleared to dc per chart review dc order placed . Pt has HH infusion services coordinated with [facility] and HH PT set up with [facility] . This CM spoke with Pam from [facility] agency [facility] who will provide abx Infusion , was updated pt's  meds are ready o be delivered and she will need her dose today before she leaves , pt will self dose there IV abx  tomm & completed  2 teaching from [facility] infusion RN  already. Pam from [facility] agency informed staff will be there Mon to complete  port care , infusion, and reinforce any teaching needs for pt  ! This CM updated pt and healthcare team of above info. No further dc needs assessed or requested at this time .  Final next level of care: Home w Home Health Services (Pt has HH Infusion with [facility] & HH PT with [facility]) Barriers to Discharge: Barriers Resolved   Patient Goals and CMS Choice Patient states their goals for this hospitalization and ongoing recovery are:: To DC Home safe with HH PT & HH Infusion for antibitoc therapy CMS Medicare.gov Compare Post Acute Care list provided to::  (N/A) Choice offered to / list presented to : NA                        Discharge Plan and Services Additional resources added to the After Visit Summary for     Discharge Planning Services: CM Consult            DME Arranged: N/A DME Agency: [facility], [place] Date DME Agency Contacted: 10/18/23     HH Arranged: RN, PT, IV Antibiotics (With [facility] RN & [facility] for [facility] PT) HH Agency: [name] Date [facility] Agency Contacted: 10/21/23 Time HH Agency Contacted: [id number] Representative spoke with at [facility]  Agency: [facility] & Randel Buss at [place]  Social Drivers of Health (SDOH) Interventions SDOH Screenings   Food Insecurity: No Food Insecurity (10/16/2023)  Housing: Low Risk  (10/16/2023)  Transportation Needs: No Transportation Needs (10/16/2023)  Utilities: Not At Risk (10/16/2023)  Recent Concern: Utilities - At Risk (09/11/2023)  Alcohol Screen: Low Risk  (07/14/2023)  Depression (PHQ2-9): Low Risk  (08/29/2023)  Financial Resource Strain: Low Risk  (08/23/2023)   Received from [facility]  Physical Activity: Insufficiently Active (07/14/2023)  Social Connections: Socially Integrated (10/16/2023)  Stress: Stress Concern Present (07/14/2023)  Tobacco Use: Medium Risk (10/17/2023)     Readmission Risk Interventions    07/13/2021    1:04 PM 04/09/2021    1:02 PM  Readmission Risk Prevention Plan  Transportation Screening Complete Complete  PCP or Specialist Appt within 3-5 Days  Complete  HRI or Home Care Consult  Complete  Social Work Consult for Recovery Care Planning/Counseling  Complete  Palliative Care Screening  Not Applicable  Medication Review [employer]) Complete Complete  Palliative Care Screening Not Applicable   Skilled Nursing Facility Not Applicable

## 2023-10-21 NOTE — Plan of Care (Signed)
 ?  Problem: Clinical Measurements: ?Goal: Will remain free from infection ?Outcome: Progressing ?  ?

## 2023-10-21 NOTE — Progress Notes (Signed)
 Subjective: 4 Days Post-Op Procedure(s) (LRB): EXCISIONAL TOTAL HIP ARTHROPLASTY WITH PLACEMENT OF ANTIBIOTIC BEADS AND DRAIN (Left) Patient reports pain as moderate. She noticed increased pain in left groin. Patient is well, and has had no acute complaints or problems Denies any CP, SOB, ABD pain. We will continue therapy today.  + BM  Objective: Vital signs in last 24 hours: Temp:  [97.4 F (36.3 C)-98.2 F (36.8 C)] 97.4 F (36.3 C) (04/12 0726) Pulse Rate:  [58-64] 58 (04/12 0503) Resp:  [16-18] 16 (04/12 0726) BP: (120-146)/(49-59) 125/52 (04/12 0726) SpO2:  [96 %-100 %] 100 % (04/12 0726)  Intake/Output from previous day: 04/11 0701 - 04/12 0700 In: 305 [P.O.:240; I.V.:3; IV Piggyback:62] Out: -  Intake/Output this shift: No intake/output data recorded.  Recent Labs    10/19/23 0444  HGB 8.6*   Recent Labs    10/19/23 0444  WBC 4.9  RBC 2.76*  HCT 25.7*  PLT 181   Recent Labs    10/20/23 0500  CREATININE 0.92   No results for input(s): "LABPT", "INR" in the last 72 hours.  EXAM General - Patient is Alert, Appropriate, and Oriented Extremity - Neurovascular intact Sensation intact distally Intact pulses distally Dorsiflexion/Plantar flexion intact No cellulitis present Compartment soft Leg lengths equal Dressing - dressing C/D/I and no drainage in provena Motor Function - intact, moving foot and toes well on exam.   Past Medical History:  Diagnosis Date   Achilles tendinitis    Acute medial meniscal tear    Allergy    Anemia    Anxiety    a.) on BZO PRN (alprazolam)   Aortic atherosclerosis (HCC)    Ascending aorta dilatation (HCC) 10/20/2022   a.) TTE 10/20/2022: asc Ao 39 mm   Atrial fibrillation/flutter (HCC) 07/2016   a.) Dx'd 07/2016; b.) CHA2DS2-VASc = 5 (age, sex, HFmrEF, HTN, vasc disease history) as of 05/09/2023; c.) s/p DCCV 09/07/2016 (150 J x 2 --> A.flutter; 200 J x 1 --> NSR); d.) s/p DCCV 10/19/2016 (150 J x 1 --> NSR);  e.) brought in for DCCV 12/2016; cancelled d/t NSR; f.) s/p PVI ablation 12/12/2022; g.) cardiac rate/rhythm maintained on oral bisoprolol; chronically anticoagulated using apixaban   BMI 50.0-59.9, adult (HCC) 07/16/2018   CAD (coronary artery disease)    a.) cCTA 12/08/2022: Ca2+ = 31.6 (58th %'ile for age/sex/race match control)   Cardiomegaly    Chemotherapy induced nausea and vomiting 05/18/2021   Chronic heart failure with mid-range ejection fraction (HFmrEF) (HCC)    a.) TTE 03/08/21: EF 60-65, mild LVH, G1DD, mild LAE, triv MR; b.) TTE 07/29/21: EF 60-65, G1DD, mild LAE, triv MR; c.) TTE 11/26/21: EF 55-60, G1DD, mild LAE, mild MR; d.) TTE 02/25/22: EF 60-65, G1DD, mild LAE, mild MR; e.) TTE 05/31/22: EF 55-60, mild LVH, G2DD, mild-mod MR; f.) TTE 10/20/22: EF 45-50, mild LAE, mod red RVSF, mild-mod TR, Asc Ao 39; g.) TTE 12/13/22: EF 50-55, mild LVH, mild MAC   CKD (chronic kidney disease), stage II    Colon polyp    Complication of anesthesia    a.) emergence delirium; b.) hypoxia after hip replacement   COPD (chronic obstructive pulmonary disease) (HCC)    DDD (degenerative disc disease), lumbar    a.) s/p fusion   Depression    Endometriosis    Fibrocystic breast disease    GERD (gastroesophageal reflux disease)    History of kidney stones    History of stress test    a.) MV 07/29/2016:  EF 55-65%. Small, mild defect  in mid anteroseptal, apical septal, and apical locations. No ischemia-->low risk; b.) MV 10/2022: mod sized partially rev apical ant, apical septal, and apical defect. EF 52%. No signific cor Ca2+; c.) cCTA 12/08/2022: Ca2+ = 31.6 (58th %'ile).   History of tobacco use    Hyperlipidemia    Hypertension    Hypothyroidism    a.) s/p partial thyroidectomy for nodule; pathology benign   Insomnia    Left knee pain, unspecified chronicity    Lipoma    Malignant neoplasm of upper-outer quadrant of right female breast (HCC) 03/03/2021   a.) stage 1A invasive mammary carcinoma  (cT1b or T1c N0); ER (-), PR (+), HER2/neu (+); did not tolerate adjuvant chemotherapy (paclitacel + trastuzumab) despite paclitaxel dose reduction; s/p adjuvant XRT; cinished maintenance trastuzumab 07/27/2022; no adjuvant endocrine therapy as no clear benefit   Migraine    Nephrolithiasis    On apixaban therapy    OSA (obstructive sleep apnea)    a.) non-compliant with nocturnal PAP therapy   Osteoarthritis    Osteopenia    Peptic ulcer disease    a.) H. pylori   Pericarditis 12/12/2022   a.) following A.fib ablation; resolved   Pneumonia    Port-A-Cath in place    RLS (restless legs syndrome)    a.) on ropinirole   Vitamin D deficiency     Assessment/Plan:   4 Days Post-Op Procedure(s) (LRB): EXCISIONAL TOTAL HIP ARTHROPLASTY WITH PLACEMENT OF ANTIBIOTIC BEADS AND DRAIN (Left) Principal Problem:   Left hip pain Active Problems:   Hypertension   Depression   Atrial fibrillation/flutter (HCC)   RLS (restless legs syndrome)   Hyperlipidemia   Chronic heart failure with preserved ejection fraction (HFpEF) (HCC)   Intractable pain   Lumbar radiculitis   Unable to ambulate   Dysuria   Acute postoperative anemia due to expected blood loss   Prosthetic joint infection (HCC)   Staphylococcus infection  Estimated body mass index is 39.61 kg/m as calculated from the following:   Height as of this encounter: 5\' 5"  (1.651 m).   Weight as of this encounter: 108 kg. Advance diet Up with therapy Pain well controlled, increased tramadol to 50-100mg  q 6hrs VSS Continue with IV antibiotics per ID Plan on discharge to home today   Patient will need follow up with [facility] ortho next week for provena removal and wound check.  DVT Prophylaxis - TED hose and SCDS Eliquis Weight-Bearing as tolerated to left leg   T. Thomos Flies, PA-C [facility] 10/21/2023, 9:23 AM

## 2023-10-21 NOTE — Assessment & Plan Note (Signed)
Body mass index is 39.61 kg/m.

## 2023-10-21 NOTE — Assessment & Plan Note (Signed)
 10-16-2023 through 10-20-2023 - Continue home Crestor  10-21-2023 stable.

## 2023-10-21 NOTE — Assessment & Plan Note (Signed)
 10-16-2023 through 10-20-2023 - Continue home Requip  10-21-2023 stable

## 2023-10-21 NOTE — Plan of Care (Signed)
 ?  Problem: Clinical Measurements: ?Goal: Ability to maintain clinical measurements within normal limits will improve ?Outcome: Progressing ?Goal: Will remain free from infection ?Outcome: Progressing ?Goal: Diagnostic test results will improve ?Outcome: Progressing ?  ?

## 2023-10-21 NOTE — Subjective & Objective (Signed)
 Pt refusing discharge from hospital. Pt walked >400 feet yesterday with PT. No further indication for hospitalization. Discussed with orthopedics.

## 2023-10-21 NOTE — Assessment & Plan Note (Signed)
 10-21-2023 pt has pain clinic contract.

## 2023-10-22 DIAGNOSIS — T8452XA Infection and inflammatory reaction due to internal left hip prosthesis, initial encounter: Secondary | ICD-10-CM | POA: Diagnosis not present

## 2023-10-22 LAB — BASIC METABOLIC PANEL WITH GFR
Anion gap: 9 (ref 5–15)
BUN: 19 mg/dL (ref 8–23)
CO2: 29 mmol/L (ref 22–32)
Calcium: 8.7 mg/dL — ABNORMAL LOW (ref 8.9–10.3)
Chloride: 99 mmol/L (ref 98–111)
Creatinine, Ser: 0.86 mg/dL (ref 0.44–1.00)
GFR, Estimated: >60 mL/min (ref >60)
Glucose, Bld: 88 mg/dL (ref 70–99)
Potassium: 4 mmol/L (ref 3.5–5.1)
Sodium: 137 mmol/L (ref 135–145)

## 2023-10-22 LAB — AEROBIC/ANAEROBIC CULTURE W GRAM STAIN (SURGICAL/DEEP WOUND)
Culture: NO GROWTH
Gram Stain: NONE SEEN

## 2023-10-22 LAB — CBC WITH DIFFERENTIAL/PLATELET
Abs Immature Granulocytes: 0.03 10*3/uL (ref 0.00–0.07)
Basophils Absolute: 0 10*3/uL (ref 0.0–0.1)
Basophils Relative: 0 %
Eosinophils Absolute: 0.2 10*3/uL (ref 0.0–0.5)
Eosinophils Relative: 3 %
HCT: 27.7 % — ABNORMAL LOW (ref 36.0–46.0)
Hemoglobin: 9.2 g/dL — ABNORMAL LOW (ref 12.0–15.0)
Immature Granulocytes: 1 %
Lymphocytes Relative: 29 %
Lymphs Abs: 1.7 10*3/uL (ref 0.7–4.0)
MCH: 30.4 pg (ref 26.0–34.0)
MCHC: 33.2 g/dL (ref 30.0–36.0)
MCV: 91.4 fL (ref 80.0–100.0)
Monocytes Absolute: 0.5 10*3/uL (ref 0.1–1.0)
Monocytes Relative: 8 %
Neutro Abs: 3.4 10*3/uL (ref 1.7–7.7)
Neutrophils Relative %: 59 %
Platelets: 235 10*3/uL (ref 150–400)
RBC: 3.03 MIL/uL — ABNORMAL LOW (ref 3.87–5.11)
RDW: 13.2 % (ref 11.5–15.5)
WBC: 5.8 10*3/uL (ref 4.0–10.5)
nRBC: 0 % (ref 0.0–0.2)

## 2023-10-22 LAB — MAGNESIUM: Magnesium: 2.2 mg/dL (ref 1.7–2.4)

## 2023-10-22 NOTE — Progress Notes (Signed)
 Physical Therapy Treatment Patient Details Name: Amber Barnes MRN: [id number] DOB: 1951/06/20 Today's Date: 10/22/2023   History of Present Illness Pt is a 73 yo F admitted with increased pain in L hip, she has history of recent  L TKA 1 month ago, L THA in 2024. Now s/p L THA with antibiotic beads. PMH includes BLE THA, COPD, HTN, A-fib, and anxiety.  Of note pt reports history of cardiac ablation procedure with baseline HR of 50-60 bpm.    PT Comments  Pt able to get OOB and complete x 1 lap today.  Opts to remain in chair.  Pt stated she feels more confident with mobility today and plans to discharge home today.  No further questions or concerns voiced.   If plan is discharge home, recommend the following: A little help with walking and/or transfers;A little help with bathing/dressing/bathroom   Can travel by private vehicle        Equipment Recommendations  None recommended by PT    Recommendations for Other Services       Precautions / Restrictions Precautions Precautions: Anterior Hip;Fall Precaution Booklet Issued: Yes (comment) Recall of Precautions/Restrictions: Intact Restrictions Weight Bearing Restrictions Per Provider Order: Yes LLE Weight Bearing Per Provider Order: Weight bearing as tolerated     Mobility  Bed Mobility Overal bed mobility: Needs Assistance               Patient Response: Cooperative  Transfers Overall transfer level: Modified independent Equipment used: Rolling walker (2 wheels) Transfers: Sit to/from Stand Sit to Stand: Modified independent (Device/Increase time)   Step pivot transfers: Modified independent (Device/Increase time)            Ambulation/Gait Ambulation/Gait assistance: Supervision Gait Distance (Feet): 200 Feet Assistive device: Rolling walker (2 wheels) Gait Pattern/deviations: Step-through pattern Gait velocity: decr     General Gait Details: pt stated she feels more confident today.  occasional  cramping but manages with occasional standing rest breaks with AROM with walker support   Stairs             Wheelchair Mobility     Tilt Bed Tilt Bed Patient Response: Cooperative  Modified Rankin (Stroke Patients Only)       Balance Overall balance assessment: Modified Independent                                          Communication Communication Communication: No apparent difficulties  Cognition Arousal: Alert Behavior During Therapy: WFL for tasks assessed/performed   PT - Cognitive impairments: No apparent impairments                       PT - Cognition Comments: Pt is A and O x 4 Following commands: Intact      Cueing Cueing Techniques: Verbal cues  Exercises      General Comments        Pertinent Vitals/Pain Pain Assessment Pain Assessment: Faces Faces Pain Scale: Hurts little more Pain Descriptors / Indicators: Sharp, Cramping Pain Intervention(s): Limited activity within patient's tolerance, Monitored during session, Patient requesting pain meds-RN notified, Repositioned, Ice applied    Home Living                          Prior Function            PT Goals (current  goals can now be found in the care plan section) Progress towards PT goals: Progressing toward goals    Frequency    7X/week      PT Plan      Co-evaluation              AM-PAC PT "6 Clicks" Mobility   Outcome Measure  Help needed turning from your back to your side while in a flat bed without using bedrails?: None Help needed moving from lying on your back to sitting on the side of a flat bed without using bedrails?: None Help needed moving to and from a bed to a chair (including a wheelchair)?: None Help needed standing up from a chair using your arms (e.g., wheelchair or bedside chair)?: None Help needed to walk in hospital room?: A Little Help needed climbing 3-5 steps with a railing? : A Little 6 Click Score:  22    End of Session Equipment Utilized During Treatment: Gait belt Activity Tolerance: Patient tolerated treatment well Patient left: in chair;with call bell/phone within reach Nurse Communication: Mobility status PT Visit Diagnosis: Muscle weakness (generalized) (M62.81);Pain;Difficulty in walking, not elsewhere classified (R26.2) Pain - Right/Left: Left Pain - part of body: Hip     Time: 1003-1017 PT Time Calculation (min) (ACUTE ONLY): 14 min  Charges:    $Gait Training: 8-22 mins PT General Charges $$ ACUTE PT VISIT: 1 Visit                   Charlanne Cong, PTA 10/22/23, 10:53 AM

## 2023-10-22 NOTE — Progress Notes (Signed)
 Subjective: 5 Days Post-Op Procedure(s) (LRB): EXCISIONAL TOTAL HIP ARTHROPLASTY WITH PLACEMENT OF ANTIBIOTIC BEADS AND DRAIN (Left) Patient reports pain as mild.  Pain well-controlled with tramadol and oxycodone.  Good progress of physical therapy this morning.  She feels safe discharging home today. Patient is well, and has had no acute complaints or problems Denies any CP, SOB, ABD pain. + BM  Objective: Vital signs in last 24 hours: Temp:  [97.4 F (36.3 C)-98.3 F (36.8 C)] 97.8 F (36.6 C) (04/13 0748) Pulse Rate:  [58-64] 58 (04/13 0748) Resp:  [16-18] 17 (04/13 0748) BP: (100-132)/(52-74) 124/63 (04/13 0748) SpO2:  [96 %-99 %] 96 % (04/13 0748)  Intake/Output from previous day: 04/12 0701 - 04/13 0700 In: 170 [P.O.:170] Out: -  Intake/Output this shift: Total I/O In: 360 [P.O.:360] Out: -   Recent Labs    10/22/23 0410  HGB 9.2*   Recent Labs    10/22/23 0410  WBC 5.8  RBC 3.03*  HCT 27.7*  PLT 235   Recent Labs    10/20/23 0500 10/22/23 0410  NA  --  137  K  --  4.0  CL  --  99  CO2  --  29  BUN  --  19  CREATININE 0.92 0.86  GLUCOSE  --  88  CALCIUM  --  8.7*   No results for input(s): "LABPT", "INR" in the last 72 hours.  EXAM General - Patient is Alert, Appropriate, and Oriented Extremity - Neurovascular intact Sensation intact distally Intact pulses distally Dorsiflexion/Plantar flexion intact No cellulitis present Compartment soft Leg lengths equal Dressing - dressing C/D/I and no drainage in provena Motor Function - intact, moving foot and toes well on exam.   Past Medical History:  Diagnosis Date   Achilles tendinitis    Acute medial meniscal tear    Allergy    Anemia    Anxiety    a.) on BZO PRN (alprazolam)   Aortic atherosclerosis (HCC)    Ascending aorta dilatation (HCC) 10/20/2022   a.) TTE 10/20/2022: asc Ao 39 mm   Atrial fibrillation/flutter (HCC) 07/2016   a.) Dx'd 07/2016; b.) CHA2DS2-VASc = 5 (age, sex,  HFmrEF, HTN, vasc disease history) as of 05/09/2023; c.) s/p DCCV 09/07/2016 (150 J x 2 --> A.flutter; 200 J x 1 --> NSR); d.) s/p DCCV 10/19/2016 (150 J x 1 --> NSR); e.) brought in for DCCV 12/2016; cancelled d/t NSR; f.) s/p PVI ablation 12/12/2022; g.) cardiac rate/rhythm maintained on oral bisoprolol; chronically anticoagulated using apixaban   BMI 50.0-59.9, adult (HCC) 07/16/2018   CAD (coronary artery disease)    a.) cCTA 12/08/2022: Ca2+ = 31.6 (58th %'ile for age/sex/race match control)   Cardiomegaly    Chemotherapy induced nausea and vomiting 05/18/2021   Chronic heart failure with mid-range ejection fraction (HFmrEF) (HCC)    a.) TTE 03/08/21: EF 60-65, mild LVH, G1DD, mild LAE, triv MR; b.) TTE 07/29/21: EF 60-65, G1DD, mild LAE, triv MR; c.) TTE 11/26/21: EF 55-60, G1DD, mild LAE, mild MR; d.) TTE 02/25/22: EF 60-65, G1DD, mild LAE, mild MR; e.) TTE 05/31/22: EF 55-60, mild LVH, G2DD, mild-mod MR; f.) TTE 10/20/22: EF 45-50, mild LAE, mod red RVSF, mild-mod TR, Asc Ao 39; g.) TTE 12/13/22: EF 50-55, mild LVH, mild MAC   CKD (chronic kidney disease), stage II    Colon polyp    Complication of anesthesia    a.) emergence delirium; b.) hypoxia after hip replacement   COPD (chronic obstructive pulmonary disease) (  HCC)    DDD (degenerative disc disease), lumbar    a.) s/p fusion   Depression    Endometriosis    Family history of CLL (chronic lymphoid leukemia) 03/21/2020   Fibrocystic breast disease    GERD (gastroesophageal reflux disease)    History of kidney stones    History of stress test    a.) MV 07/29/2016: EF 55-65%. Small, mild defect  in mid anteroseptal, apical septal, and apical locations. No ischemia-->low risk; b.) MV 10/2022: mod sized partially rev apical ant, apical septal, and apical defect. EF 52%. No signific cor Ca2+; c.) cCTA 12/08/2022: Ca2+ = 31.6 (58th %'ile).   History of tobacco use    Hyperlipidemia    Hypertension    Hypothyroidism    a.) s/p partial  thyroidectomy for nodule; pathology benign   Insomnia    Left knee pain, unspecified chronicity    Lipoma    Malignant neoplasm of upper-outer quadrant of right female breast (HCC) 03/03/2021   a.) stage 1A invasive mammary carcinoma (cT1b or T1c N0); ER (-), PR (+), HER2/neu (+); did not tolerate adjuvant chemotherapy (paclitacel + trastuzumab) despite paclitaxel dose reduction; s/p adjuvant XRT; cinished maintenance trastuzumab 07/27/2022; no adjuvant endocrine therapy as no clear benefit   Migraine    Nephrolithiasis    On apixaban therapy    OSA (obstructive sleep apnea)    a.) non-compliant with nocturnal PAP therapy   Osteoarthritis    Osteopenia    PAF (paroxysmal atrial fibrillation) (HCC) 12/13/2022   Peptic ulcer disease    a.) H. pylori   Pericarditis 12/12/2022   a.) following A.fib ablation; resolved   Pneumonia    Port-A-Cath in place    Primary localized osteoarthritis of pelvic region and thigh 03/02/2022   RLS (restless legs syndrome)    a.) on ropinirole   S/P TKR (total knee replacement), left 09/11/2023   S/P total left hip arthroplasty 05/11/2023   Vertigo 06/08/2016   Vitamin D deficiency     Assessment/Plan:   5 Days Post-Op Procedure(s) (LRB): EXCISIONAL TOTAL HIP ARTHROPLASTY WITH PLACEMENT OF ANTIBIOTIC BEADS AND DRAIN (Left) Principal Problem:   Left hip pain Active Problems:   Hypertension   Depression   Atrial fibrillation/flutter (HCC)   RLS (restless legs syndrome)   Obesity, Class II, BMI 35-39.9   Hyperlipidemia   Chronic heart failure with preserved ejection fraction (HFpEF) (HCC)   Intractable pain   Lumbar radiculitis   Unable to ambulate   Dysuria   Acute postoperative anemia due to expected blood loss   Prosthetic joint infection (HCC)   Staphylococcus infection  Estimated body mass index is 39.61 kg/m as calculated from the following:   Height as of this encounter: 5\' 5"  (1.651 m).   Weight as of this encounter: 108  kg. Advance diet Up with therapy Pain well controlled VSS Continue with IV antibiotics per ID Plan on discharge to home today    Patient will need follow up with [facility] ortho next week for provena removal and wound check.  DVT Prophylaxis - TED hose and SCDS Eliquis Weight-Bearing as tolerated to left leg   T. Thomos Flies, PA-C [facility] 10/22/2023, 11:22 AM

## 2023-10-22 NOTE — Plan of Care (Signed)
  Problem: Clinical Measurements: Goal: Diagnostic test results will improve Outcome: Progressing   Problem: Clinical Measurements: Goal: Respiratory complications will improve Outcome: Progressing   

## 2023-10-23 ENCOUNTER — Telehealth: Payer: Self-pay

## 2023-10-23 DIAGNOSIS — T8452XA Infection and inflammatory reaction due to internal left hip prosthesis, initial encounter: Secondary | ICD-10-CM | POA: Diagnosis not present

## 2023-10-23 LAB — MIC RESULT

## 2023-10-23 LAB — MINIMUM INHIBITORY CONC. (1 DRUG)

## 2023-10-23 NOTE — Patient Instructions (Signed)
 Visit Information  Thank you for taking time to visit with me today. Please don't hesitate to contact me if I can be of assistance to you before our next scheduled telephone appointment.  Our next appointment is by telephone on Wednesday April 23rd at 2:00pm  Following is a copy of your care plan:   Goals Addressed             This Visit's Progress    VBCI Transitions of Care (TOC) Care Plan       Problems:  Recent Hospitalization for treatment of  Infection Equipment/DME barrier Equipment for Wound Vac  Goal:  Over the next 30 days, the patient will not experience hospital readmission  Interventions:   Surgery (THR and post op infection 10/16/23): Evaluation of current treatment plan related to Post Op infection for [facility] surgery assessed patient/caregiver understanding of surgical procedure   reviewed post-operative instructions with patient/caregiver addressed questions about post - surgical incision care  reviewed medications with patient and addressed questions reviewed scheduled provider appointments with patientAppointment 10/27/23 with Dr. Clyda Dark  Patient Self Care Activities:  Attend all scheduled provider appointments Call pharmacy for medication refills 3-7 days in advance of running out of medications Call provider office for new concerns or questions  Notify RN Care Manager of [facility] call rescheduling needs Participate in Transition of Care Program/Attend [facility] scheduled calls Perform all self care activities independently  Take medications as prescribed    Plan:  Telephone follow up appointment with care management team member scheduled for:  Wednesday  April 23rd at 2:00pm        Patient verbalizes understanding of instructions and care plan provided today and agrees to view in MyChart. Active MyChart status and patient understanding of how to access instructions and care plan via MyChart confirmed with patient.     The patient has been provided with contact  information for the care management team and has been advised to call with any health related questions or concerns.   Please call the care guide team at [phone] if you need to cancel or reschedule your appointment.   Please call the Suicide and Crisis Lifeline: 988 call the [place]  National Suicide Prevention Lifeline: (859) 484-0613 or TTY: 587-691-7835 TTY 218-538-2648) to talk to a trained counselor if you are experiencing a Mental Health or Behavioral Health Crisis or need someone to talk to.  Gareld June, BSN, RN [place]  VBCI - [employer] [facility] RN Care Manager [phone]

## 2023-10-23 NOTE — Transitions of Care (Post Inpatient/ED Visit) (Signed)
 10/23/2023  Name: Amber Barnes MRN: [id number] DOB: March 13, 1951  Today's TOC FU Call Status: Today's TOC FU Call Status:: Successful TOC FU Call Completed TOC FU Call Complete Date: 10/23/23 Patient's Name and Date of Birth confirmed.  Transition Care Management Follow-up Telephone Call Date of Discharge: 10/22/23 Discharge Facility: [facility]) Type of Discharge: Inpatient Admission Primary Inpatient Discharge Diagnosis:: Abscess s/p THR How have you been since you were released from the hospital?: Same Any questions or concerns?: Yes Patient Questions/Concerns:: Has a wound vac. Needs more cartriges due to increased drainage Patient Questions/Concerns Addressed: Other: (The patient put a call into the provider)  Items Reviewed: Did you receive and understand the discharge instructions provided?: Yes Medications obtained,verified, and reconciled?: Yes (Medications Reviewed) Any new allergies since your discharge?: No Dietary orders reviewed?: Yes Type of Diet Ordered:: low sodium heart healthy Do you have support at home?: Yes People in Home [RPT]: spouse Name of Support/Comfort Primary Source: Amber Barnes  Medications Reviewed Today: Medications Reviewed Today     Reviewed by Claudene Crystal, RN (Case Manager) on 10/23/23 at 1431  Med List Status: <None>   Medication Order Taking? Sig Documenting Provider Last Dose Status Informant  acetaminophen (TYLENOL) 500 MG tablet [id number]  Take 2 tablets (1,000 mg total) by mouth every 8 (eight) hours. Coralyn Derry, PA-C  Active Self  ALPRAZolam (XANAX) 0.25 MG tablet [id number] No TAKE 1 TABLET BY MOUTH TWICE A DAY AS NEEDED FOR ANXIETY Calista Catching, FNP 09/11/2023  5:00 AM Active Self, Pharmacy Records  bisoprolol (ZEBETA) 10 MG tablet [id number] No Take 1 tablet (10 mg total) by mouth 2 (two) times daily. Gollan, Timothy J, MD 10/15/2023 Evening Active Self, Pharmacy Records  busPIRone  (BUSPAR) 5 MG tablet [id number] No TAKE 1 TABLET BY MOUTH THREE TIMES A DAY AS NEEDED Calista Catching, FNP 10/15/2023 Active Self  Calcium Carbonate (CALCIUM 500 PO) [id number] No Take 1 tablet by mouth daily. [provider] 10/15/2023 Active Self  Cholecalciferol (VITAMIN D3) 5000 units CAPS [id number] No Take 5,000 Units by mouth daily. [provider] 10/15/2023 Active Self, Pharmacy Records  daptomycin (CUBICIN) IVPB [id number]  Inject 600 mg into the vein daily. 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 Please pull PIC at completion of IV antibiotics Call [phone] with critical value or questions Unk Garb, DO  Active   docusate sodium (COLACE) 100 MG capsule [id number] No Take 1 capsule (100 mg total) by mouth 2 (two) times daily. Coralyn Derry, PA-C Past Month Active Self  ELIQUIS 5 MG TABS tablet [id number] No TAKE 1 TABLET BY MOUTH TWICE A DAY Gollan, Timothy J, MD 10/15/2023 Evening Active Self, Pharmacy Records  escitalopram (LEXAPRO) 10 MG tablet [id number] No Take 1 tablet (10 mg total) by mouth daily. Calista Catching, FNP 10/15/2023 Active Self, Pharmacy Records  furosemide (LASIX) 20 MG tablet [id number] No Take 1 tablet (20 mg total) by mouth daily. May take additional 20mg  PO prn for leg swelling Calista Catching, FNP 10/15/2023 Active Self, Pharmacy Records  losartan-hydrochlorothiazide [facility]) 50-12.5 MG tablet [id number] No Take 0.5 tablets by  mouth daily as needed (for BP 140/80). Allegra Grana, FNP Past Month Active Self, Pharmacy Records            Med Note Sharia Reeve   Fri Aug 25, 2023  5:53 PM) prn  Multiple Vitamin (MULTIVITAMIN) tablet [id number] No Take 1 tablet by mouth daily. [provider] 10/15/2023 Active Self, Pharmacy Records  ondansetron (ZOFRAN) 4 MG tablet [id number]  Take 1 tablet (4 mg total) by mouth every 6 (six) hours as needed for nausea. Evon Slack, PA-C  Active Self  oxyCODONE (ROXICODONE) 5 MG immediate release tablet [id number]  Take 0.5 tablets (2.5 mg total) by mouth every 8 (eight) hours as needed for breakthrough pain. Evon Slack, PA-C  Active   potassium chloride SA (KLOR-CON M) 20 MEQ tablet [id number] No Take 1 tablet (20 mEq total) by mouth daily.  Patient taking differently: Take 20 mEq by mouth every morning. And if she takes additional Lasix she will take another K+   Antonieta Iba, MD 10/15/2023 Active Self, Pharmacy Records  Discontinued 03/02/22 1309 ranolazine (RANEXA) 500 MG 12 hr tablet [id number] No TAKE 1 TABLET BY MOUTH TWICE A DAY Antonieta Iba, MD 10/15/2023 Active Self, Pharmacy Records  rOPINIRole (REQUIP) 2 MG tablet [id number] No Take 2 mg by mouth at bedtime. [provider] 10/15/2023 Bedtime Active Self, Pharmacy Records  rosuvastatin (CRESTOR) 40 MG tablet [id number] No TAKE 1 TABLET BY MOUTH EVERY DAY Allegra Grana, FNP 10/15/2023 Active Self, Pharmacy Records  traMADol (ULTRAM) 50 MG tablet [id number]  Take 1-2 tablets (50-100 mg total) by mouth every 6 (six) hours as needed for moderate pain (pain score 4-6). Evon Slack, PA-C  Active   Med List Note Sharia Reeve, CPhT 04/06/21 0113): UDS 02/27/2018             Home Care and Equipment/Supplies: Were Home Health Services Ordered?: Yes Name of Home Health Agency:: [facility] and [facility] Has Agency set up a time to come to your home?: Yes First Home Health Visit Date: 10/23/23 (Infusion) Any new equipment or medical supplies ordered?: Yes Name of Medical  supply agency?: [facility] Were you able to get the equipment/medical supplies?: Yes (Needs more cartriges for wound vac) Do you have any questions related to the use of the equipment/supplies?: No  Functional Questionnaire: Do you need assistance with bathing/showering or dressing?: No Do you need assistance with meal preparation?: No Do you need assistance with eating?: No Do you have difficulty maintaining continence: No Do you need assistance with getting out of bed/getting out of a chair/moving?: No Do you have difficulty managing or taking your medications?: No  Follow up appointments reviewed: PCP Follow-up appointment confirmed?: No (the patient feels like she has too many provider appointments and does not want to schedule a PCP appointment) MD Provider Line Number:[phone] Given: No Specialist Hospital Follow-up appointment confirmed?: Yes Date of Specialist follow-up appointment?: 10/26/23 Follow-Up Specialty Provider:: Dr. Audelia Acton Do you need transportation to your follow-up appointment?: No Do you understand care options if your condition(s) worsen?: Yes-patient verbalized understanding  SDOH Interventions Today    Flowsheet Row Most Recent Value  SDOH Interventions   Food Insecurity Interventions Intervention Not Indicated  Housing Interventions Intervention Not Indicated  Transportation Interventions Intervention Not Indicated  Utilities Interventions Intervention Not Indicated       Goals Addressed             This Visit's Progress  VBCI Transitions of Care (TOC) Care Plan       Problems:  Recent Hospitalization for treatment of  Infection Equipment/DME barrier Equipment for Wound Vac  Goal:  Over the next 30 days, the patient will not experience hospital readmission  Interventions:   Surgery (THR and post op infection 10/16/23): Evaluation of current treatment plan related to Post Op infection for [facility] surgery assessed patient/caregiver understanding  of surgical procedure   reviewed post-operative instructions with patient/caregiver addressed questions about post - surgical incision care  reviewed medications with patient and addressed questions reviewed scheduled provider appointments with patientAppointment 10/27/23 with Dr. Clyda Dark  Patient Self Care Activities:  Attend all scheduled provider appointments Call pharmacy for medication refills 3-7 days in advance of running out of medications Call provider office for new concerns or questions  Notify RN Care Manager of [name] call rescheduling needs Participate in Transition of Care Program/Attend [facility] scheduled calls Perform all self care activities independently  Take medications as prescribed    Plan:  Telephone follow up appointment with care management team member scheduled for:  Wednesday  April 23rd at 2:00pm       [facility] Outreach completed. The patient was discharged yesterday and was discharged home with IV antibiotics and a wound vac due to post op infection to her hip. The patient had concerns because the cartridge from the wound vac had filled and she replaced it and this cartridge is now almost full and she doesn't have a spare one because she was told it wouldn't drain. She is waiting on a call back from the surgeon. The nurse from the infusion company was out already today and assisted with administration. She does not have a PICC line because she has a PORT from chemotherapy. The patient is comfortable administering her IV meds. She is concerned about a yeast infection and states she will start eating yogurt. She states she does not have much pain and it is mostly managed by Tylenol. She is mobile but cannot mover her left leg. She has not heard from [facility] yet for HHPT. The patient is agreeable to the 30 Day Outreach program.    Routine follow-up and on-going assessment evaluation and education of disease processes, and recommended interventions for both chronic and acute  medical conditions, will occur during each weekly visit during [facility] 30-day Program Outreach calls along with ongoing review of symptoms, medication reviews and reconciliation. Any updates, inconsistencies, discrepancies or acute care concerns will be addressed on the Care Plan and routed to the correct Practitioner if indicated.    The patient has been provided with contact information for the care management team and has been advised to call with any health-related questions or concerns. The patient verbalized understanding with current POC. The patient is directed to their insurance card regarding availability of benefits coverage.  Gareld June, BSN, RN [place]  VBCI - [employer] [facility] RN Care Manager [phone]

## 2023-10-24 DIAGNOSIS — M5116 Intervertebral disc disorders with radiculopathy, lumbar region: Secondary | ICD-10-CM | POA: Diagnosis not present

## 2023-10-24 DIAGNOSIS — N182 Chronic kidney disease, stage 2 (mild): Secondary | ICD-10-CM | POA: Diagnosis not present

## 2023-10-24 DIAGNOSIS — M48061 Spinal stenosis, lumbar region without neurogenic claudication: Secondary | ICD-10-CM | POA: Diagnosis not present

## 2023-10-24 DIAGNOSIS — D631 Anemia in chronic kidney disease: Secondary | ICD-10-CM | POA: Diagnosis not present

## 2023-10-24 DIAGNOSIS — M150 Primary generalized (osteo)arthritis: Secondary | ICD-10-CM | POA: Diagnosis not present

## 2023-10-24 DIAGNOSIS — D62 Acute posthemorrhagic anemia: Secondary | ICD-10-CM | POA: Diagnosis not present

## 2023-10-24 DIAGNOSIS — I5032 Chronic diastolic (congestive) heart failure: Secondary | ICD-10-CM | POA: Diagnosis not present

## 2023-10-24 DIAGNOSIS — I130 Hypertensive heart and chronic kidney disease with heart failure and stage 1 through stage 4 chronic kidney disease, or unspecified chronic kidney disease: Secondary | ICD-10-CM | POA: Diagnosis not present

## 2023-10-24 DIAGNOSIS — I700 Atherosclerosis of aorta: Secondary | ICD-10-CM | POA: Diagnosis not present

## 2023-10-24 DIAGNOSIS — J449 Chronic obstructive pulmonary disease, unspecified: Secondary | ICD-10-CM | POA: Diagnosis not present

## 2023-10-24 DIAGNOSIS — E039 Hypothyroidism, unspecified: Secondary | ICD-10-CM | POA: Diagnosis not present

## 2023-10-24 DIAGNOSIS — M4726 Other spondylosis with radiculopathy, lumbar region: Secondary | ICD-10-CM | POA: Diagnosis not present

## 2023-10-24 DIAGNOSIS — I480 Paroxysmal atrial fibrillation: Secondary | ICD-10-CM | POA: Diagnosis not present

## 2023-10-24 DIAGNOSIS — G43909 Migraine, unspecified, not intractable, without status migrainosus: Secondary | ICD-10-CM | POA: Diagnosis not present

## 2023-10-24 DIAGNOSIS — I2510 Atherosclerotic heart disease of native coronary artery without angina pectoris: Secondary | ICD-10-CM | POA: Diagnosis not present

## 2023-10-24 DIAGNOSIS — G2581 Restless legs syndrome: Secondary | ICD-10-CM | POA: Diagnosis not present

## 2023-10-24 DIAGNOSIS — G4700 Insomnia, unspecified: Secondary | ICD-10-CM | POA: Diagnosis not present

## 2023-10-24 DIAGNOSIS — E785 Hyperlipidemia, unspecified: Secondary | ICD-10-CM | POA: Diagnosis not present

## 2023-10-24 DIAGNOSIS — T8452XA Infection and inflammatory reaction due to internal left hip prosthesis, initial encounter: Secondary | ICD-10-CM | POA: Diagnosis not present

## 2023-10-24 DIAGNOSIS — K219 Gastro-esophageal reflux disease without esophagitis: Secondary | ICD-10-CM | POA: Diagnosis not present

## 2023-10-24 DIAGNOSIS — I4892 Unspecified atrial flutter: Secondary | ICD-10-CM | POA: Diagnosis not present

## 2023-10-24 DIAGNOSIS — E559 Vitamin D deficiency, unspecified: Secondary | ICD-10-CM | POA: Diagnosis not present

## 2023-10-25 DIAGNOSIS — Z96652 Presence of left artificial knee joint: Secondary | ICD-10-CM | POA: Diagnosis not present

## 2023-10-27 DIAGNOSIS — I4892 Unspecified atrial flutter: Secondary | ICD-10-CM | POA: Diagnosis not present

## 2023-10-27 DIAGNOSIS — D62 Acute posthemorrhagic anemia: Secondary | ICD-10-CM | POA: Diagnosis not present

## 2023-10-27 DIAGNOSIS — E039 Hypothyroidism, unspecified: Secondary | ICD-10-CM | POA: Diagnosis not present

## 2023-10-27 DIAGNOSIS — I130 Hypertensive heart and chronic kidney disease with heart failure and stage 1 through stage 4 chronic kidney disease, or unspecified chronic kidney disease: Secondary | ICD-10-CM | POA: Diagnosis not present

## 2023-10-27 DIAGNOSIS — E559 Vitamin D deficiency, unspecified: Secondary | ICD-10-CM | POA: Diagnosis not present

## 2023-10-27 DIAGNOSIS — I480 Paroxysmal atrial fibrillation: Secondary | ICD-10-CM | POA: Diagnosis not present

## 2023-10-27 DIAGNOSIS — D631 Anemia in chronic kidney disease: Secondary | ICD-10-CM | POA: Diagnosis not present

## 2023-10-27 DIAGNOSIS — I5032 Chronic diastolic (congestive) heart failure: Secondary | ICD-10-CM | POA: Diagnosis not present

## 2023-10-27 DIAGNOSIS — T8452XA Infection and inflammatory reaction due to internal left hip prosthesis, initial encounter: Secondary | ICD-10-CM | POA: Diagnosis not present

## 2023-10-27 DIAGNOSIS — I2510 Atherosclerotic heart disease of native coronary artery without angina pectoris: Secondary | ICD-10-CM | POA: Diagnosis not present

## 2023-10-27 DIAGNOSIS — M48061 Spinal stenosis, lumbar region without neurogenic claudication: Secondary | ICD-10-CM | POA: Diagnosis not present

## 2023-10-27 DIAGNOSIS — M4726 Other spondylosis with radiculopathy, lumbar region: Secondary | ICD-10-CM | POA: Diagnosis not present

## 2023-10-27 DIAGNOSIS — I700 Atherosclerosis of aorta: Secondary | ICD-10-CM | POA: Diagnosis not present

## 2023-10-27 DIAGNOSIS — K219 Gastro-esophageal reflux disease without esophagitis: Secondary | ICD-10-CM | POA: Diagnosis not present

## 2023-10-27 DIAGNOSIS — E785 Hyperlipidemia, unspecified: Secondary | ICD-10-CM | POA: Diagnosis not present

## 2023-10-27 DIAGNOSIS — N182 Chronic kidney disease, stage 2 (mild): Secondary | ICD-10-CM | POA: Diagnosis not present

## 2023-10-27 DIAGNOSIS — M5116 Intervertebral disc disorders with radiculopathy, lumbar region: Secondary | ICD-10-CM | POA: Diagnosis not present

## 2023-10-27 DIAGNOSIS — J449 Chronic obstructive pulmonary disease, unspecified: Secondary | ICD-10-CM | POA: Diagnosis not present

## 2023-10-27 DIAGNOSIS — G4700 Insomnia, unspecified: Secondary | ICD-10-CM | POA: Diagnosis not present

## 2023-10-27 DIAGNOSIS — M150 Primary generalized (osteo)arthritis: Secondary | ICD-10-CM | POA: Diagnosis not present

## 2023-10-27 DIAGNOSIS — G2581 Restless legs syndrome: Secondary | ICD-10-CM | POA: Diagnosis not present

## 2023-10-27 DIAGNOSIS — G43909 Migraine, unspecified, not intractable, without status migrainosus: Secondary | ICD-10-CM | POA: Diagnosis not present

## 2023-10-31 DIAGNOSIS — M5116 Intervertebral disc disorders with radiculopathy, lumbar region: Secondary | ICD-10-CM | POA: Diagnosis not present

## 2023-10-31 DIAGNOSIS — I130 Hypertensive heart and chronic kidney disease with heart failure and stage 1 through stage 4 chronic kidney disease, or unspecified chronic kidney disease: Secondary | ICD-10-CM | POA: Diagnosis not present

## 2023-10-31 DIAGNOSIS — I480 Paroxysmal atrial fibrillation: Secondary | ICD-10-CM | POA: Diagnosis not present

## 2023-10-31 DIAGNOSIS — G4700 Insomnia, unspecified: Secondary | ICD-10-CM | POA: Diagnosis not present

## 2023-10-31 DIAGNOSIS — E785 Hyperlipidemia, unspecified: Secondary | ICD-10-CM | POA: Diagnosis not present

## 2023-10-31 DIAGNOSIS — K219 Gastro-esophageal reflux disease without esophagitis: Secondary | ICD-10-CM | POA: Diagnosis not present

## 2023-10-31 DIAGNOSIS — M150 Primary generalized (osteo)arthritis: Secondary | ICD-10-CM | POA: Diagnosis not present

## 2023-10-31 DIAGNOSIS — D631 Anemia in chronic kidney disease: Secondary | ICD-10-CM | POA: Diagnosis not present

## 2023-10-31 DIAGNOSIS — I700 Atherosclerosis of aorta: Secondary | ICD-10-CM | POA: Diagnosis not present

## 2023-10-31 DIAGNOSIS — G2581 Restless legs syndrome: Secondary | ICD-10-CM | POA: Diagnosis not present

## 2023-10-31 DIAGNOSIS — D62 Acute posthemorrhagic anemia: Secondary | ICD-10-CM | POA: Diagnosis not present

## 2023-10-31 DIAGNOSIS — I4892 Unspecified atrial flutter: Secondary | ICD-10-CM | POA: Diagnosis not present

## 2023-10-31 DIAGNOSIS — T8452XA Infection and inflammatory reaction due to internal left hip prosthesis, initial encounter: Secondary | ICD-10-CM | POA: Diagnosis not present

## 2023-10-31 DIAGNOSIS — M48061 Spinal stenosis, lumbar region without neurogenic claudication: Secondary | ICD-10-CM | POA: Diagnosis not present

## 2023-10-31 DIAGNOSIS — M4726 Other spondylosis with radiculopathy, lumbar region: Secondary | ICD-10-CM | POA: Diagnosis not present

## 2023-10-31 DIAGNOSIS — E559 Vitamin D deficiency, unspecified: Secondary | ICD-10-CM | POA: Diagnosis not present

## 2023-10-31 DIAGNOSIS — G43909 Migraine, unspecified, not intractable, without status migrainosus: Secondary | ICD-10-CM | POA: Diagnosis not present

## 2023-10-31 DIAGNOSIS — I2510 Atherosclerotic heart disease of native coronary artery without angina pectoris: Secondary | ICD-10-CM | POA: Diagnosis not present

## 2023-10-31 DIAGNOSIS — E039 Hypothyroidism, unspecified: Secondary | ICD-10-CM | POA: Diagnosis not present

## 2023-10-31 DIAGNOSIS — I5032 Chronic diastolic (congestive) heart failure: Secondary | ICD-10-CM | POA: Diagnosis not present

## 2023-10-31 DIAGNOSIS — J449 Chronic obstructive pulmonary disease, unspecified: Secondary | ICD-10-CM | POA: Diagnosis not present

## 2023-10-31 DIAGNOSIS — N182 Chronic kidney disease, stage 2 (mild): Secondary | ICD-10-CM | POA: Diagnosis not present

## 2023-11-01 ENCOUNTER — Other Ambulatory Visit: Payer: Self-pay

## 2023-11-01 NOTE — Transitions of Care (Post Inpatient/ED Visit) (Signed)
 Transition of Care week 2  Visit Note  11/01/2023  Name: Amber Barnes MRN: [id number]          DOB: 1950/07/23  Situation: Patient enrolled in [facility] 30-day program. Visit completed with Anton Kirsten by telephone.   Background:     Past Medical History:  Diagnosis Date   Achilles tendinitis    Acute medial meniscal tear    Allergy    Anemia    Anxiety    a.) on BZO PRN (alprazolam )   Aortic atherosclerosis (HCC)    Ascending aorta dilatation (HCC) 10/20/2022   a.) TTE 10/20/2022: asc Ao 39 mm   Atrial fibrillation/flutter (HCC) 07/2016   a.) Dx'd 07/2016; b.) CHA2DS2-VASc = 5 (age, sex, HFmrEF, HTN, vasc disease history) as of 05/09/2023; c.) s/p DCCV 09/07/2016 (150 J x 2 --> A.flutter; 200 J x 1 --> NSR); d.) s/p DCCV 10/19/2016 (150 J x 1 --> NSR); e.) brought in for DCCV 12/2016; cancelled d/t NSR; f.) s/p PVI ablation 12/12/2022; g.) cardiac rate/rhythm maintained on oral bisoprolol ; chronically anticoagulated using apixaban    BMI 50.0-59.9, adult (HCC) 07/16/2018   CAD (coronary artery disease)    a.) cCTA 12/08/2022: Ca2+ = 31.6 (58th %'ile for age/sex/race match control)   Cardiomegaly    Chemotherapy induced nausea and vomiting 05/18/2021   Chronic heart failure with mid-range ejection fraction (HFmrEF) (HCC)    a.) TTE 03/08/21: EF 60-65, mild LVH, G1DD, mild LAE, triv MR; b.) TTE 07/29/21: EF 60-65, G1DD, mild LAE, triv MR; c.) TTE 11/26/21: EF 55-60, G1DD, mild LAE, mild MR; d.) TTE 02/25/22: EF 60-65, G1DD, mild LAE, mild MR; e.) TTE 05/31/22: EF 55-60, mild LVH, G2DD, mild-mod MR; f.) TTE 10/20/22: EF 45-50, mild LAE, mod red RVSF, mild-mod TR, Asc Ao 39; g.) TTE 12/13/22: EF 50-55, mild LVH, mild MAC   CKD (chronic kidney disease), stage II    Colon polyp    Complication of anesthesia    a.) emergence delirium; b.) hypoxia after hip replacement   COPD (chronic obstructive pulmonary disease) (HCC)    DDD (degenerative disc disease), lumbar    a.) s/p fusion    Depression    Endometriosis    Family history of CLL (chronic lymphoid leukemia) 03/21/2020   Fibrocystic breast disease    GERD (gastroesophageal reflux disease)    History of kidney stones    History of stress test    a.) MV 07/29/2016: EF 55-65%. Small, mild defect  in mid anteroseptal, apical septal, and apical locations. No ischemia-->low risk; b.) MV 10/2022: mod sized partially rev apical ant, apical septal, and apical defect. EF 52%. No signific cor Ca2+; c.) cCTA 12/08/2022: Ca2+ = 31.6 (58th %'ile).   History of tobacco use    Hyperlipidemia    Hypertension    Hypothyroidism    a.) s/p partial thyroidectomy for nodule; pathology benign   Insomnia    Left knee pain, unspecified chronicity    Lipoma    Malignant neoplasm of upper-outer quadrant of right female breast (HCC) 03/03/2021   a.) stage 1A invasive mammary carcinoma (cT1b or T1c N0); ER (-), PR (+), HER2/neu (+); did not tolerate adjuvant chemotherapy (paclitacel + trastuzumab ) despite paclitaxel  dose reduction; s/p adjuvant XRT; cinished maintenance trastuzumab  07/27/2022; no adjuvant endocrine therapy as no clear benefit   Migraine    Nephrolithiasis    On apixaban  therapy    OSA (obstructive sleep apnea)    a.) non-compliant with nocturnal PAP therapy   Osteoarthritis  Osteopenia    PAF (paroxysmal atrial fibrillation) (HCC) 12/13/2022   Peptic ulcer disease    a.) H. pylori   Pericarditis 12/12/2022   a.) following A.fib ablation; resolved   Pneumonia    Port-A-Cath in place    Primary localized osteoarthritis of pelvic region and thigh 03/02/2022   RLS (restless legs syndrome)    a.) on ropinirole    S/P TKR (total knee replacement), left 09/11/2023   S/P total left hip arthroplasty 05/11/2023   Vertigo 06/08/2016   Vitamin D  deficiency     Assessment: TOC Outreach completed to the patient today. She was in a lot of pain due to havinc been to the Orthopedist today and he cleaned out the abscess to her left  hip and removed her wound vac. She currently has a dressing in place which she isn't to change. She complains of severe pain and is taking Oxycodone  and Tramadol . She has been told to not exercise rest her leg and not put weight on it. Discussed constipation due to immobility and opioids. She has Colace and eats prunes. She continues to self administer her IV antibiotics through her PORT. She is receiving Heparin  when flushing her PORT but is also on DVT prophylaxis with Eliquis . She states so far she has avoided a yeast infection with the ABX and eats yogurt. The patient returns to Dr. Clyda Dark on 11/08/23  Patient Reported Symptoms: Cognitive Cognitive Status: Alert and oriented to person, place, and time      Neurological Neurological Review of Symptoms: No symptoms reported    HEENT HEENT Symptoms Reported: No symptoms reported      Cardiovascular Cardiovascular Symptoms Reported: No symptoms reported Does patient have uncontrolled Hypertension?: No Cardiovascular Conditions: Heart failure Cardiovascular Management Strategies: Medication therapy, Weight management Do You Have a Working Readable Scale?: Yes Weight: 238 lb (108 kg) Cardiovascular Self-Management Outcome: 4 (good)  Respiratory Respiratory Symptoms Reported: No symptoms reported    Endocrine Patient reports the following symptoms related to hypoglycemia or hyperglycemia : No symptoms reported Is patient diabetic?: No    Gastrointestinal Gastrointestinal Symptoms Reported: Constipation Additional Gastrointestinal Details: The patient eats prunes daily Gastrointestinal Conditions: Reflux/heartburn Gastrointestinal Management Strategies: Medication therapy, Diet modification, Exercise Gastrointestinal Self-Management Outcome: 4 (good) Nutrition Risk Screen (CP): Large or nonhealing wound, burn or pressure injury  Genitourinary Genitourinary Symptoms Reported: No symptoms reported Genitourinary Self-Management Outcome: 4  (good)  Integumentary Integumentary Symptoms Reported: Wound Additional Integumentary Details: Wound vac discontinued Skin Conditions: Wound Skin Management Strategies: Adequate rest, Medication therapy Skin Comment: The wound vac has been discontinued and no further drainage noted  Musculoskeletal Musculoskelatal Symptoms Reviewed: Difficulty walking, Weakness Additional Musculoskeletal Details: Currently not allowed to bear weight on left leg Musculoskeletal Conditions: Mobility limited, Other Other Musculoskeletal Conditions: THR and TKR Musculoskeletal Management Strategies: Medication therapy, Adequate rest Musculoskeletal Self-Management Outcome: 3 (uncertain) Musculoskeletal Comment: Has been instructed not to be active Falls in the past year?: No Number of falls in past year: 1 or less Was there an injury with Fall?: No Fall Risk Category Calculator: 0 Patient Fall Risk Level: Low Fall Risk Patient at Risk for Falls Due to: Impaired mobility, Impaired balance/gait, Medication side effect, Orthopedic patient Fall risk Follow up: Education provided, Falls prevention discussed  Psychosocial Psychosocial Symptoms Reported: No symptoms reported         There were no vitals filed for this visit.  Medications Reviewed Today     Reviewed by Claudene Crystal, RN (Case Manager) on 11/01/23 at 1415  Med List Status: <None>   Medication Order Taking? Sig Documenting Provider Last Dose Status Informant  acetaminophen  (TYLENOL ) 500 MG tablet [id number]  Take 2 tablets (1,000 mg total) by mouth every 8 (eight) hours. Coralyn Derry, PA-C  Active Self  ALPRAZolam  (XANAX ) 0.25 MG tablet [id number] No TAKE 1 TABLET BY MOUTH TWICE A DAY AS NEEDED FOR ANXIETY Calista Catching, FNP 09/11/2023  5:00 AM Active Self, Pharmacy Records  bisoprolol  (ZEBETA ) 10 MG tablet [id number] No Take 1 tablet (10 mg total) by mouth 2 (two) times daily. Gollan, Timothy J, MD 10/15/2023 Evening Active Self, Pharmacy  Records  busPIRone  (BUSPAR ) 5 MG tablet [id number] No TAKE 1 TABLET BY MOUTH THREE TIMES A DAY AS NEEDED Calista Catching, FNP 10/15/2023 Active Self  Calcium  Carbonate (CALCIUM  500 PO) [id number] No Take 1 tablet by mouth daily. [provider] 10/15/2023 Active Self  Cholecalciferol  (VITAMIN D3) 5000 units CAPS [id number] No Take 5,000 Units by mouth daily. [provider] 10/15/2023 Active Self, Pharmacy Records  daptomycin  (CUBICIN ) IVPB [id number]  Inject 600 mg into the vein daily. 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 Please pull PIC at completion of IV antibiotics Call [phone] with critical value or questions Unk Garb, DO  Active   docusate sodium  (COLACE) 100 MG capsule [id number] No Take 1 capsule (100 mg total) by mouth 2 (two) times daily. Coralyn Derry, PA-C Past Month Active Self  ELIQUIS  5 MG TABS tablet [id number] No TAKE 1 TABLET BY MOUTH TWICE A DAY Gollan, Timothy J, MD 10/15/2023 Evening Active Self, Pharmacy Records  escitalopram  (LEXAPRO ) 10 MG tablet [id number] No Take 1 tablet (10 mg total) by mouth daily. Calista Catching, FNP 10/15/2023 Active Self, Pharmacy Records  furosemide  (LASIX ) 20 MG tablet [id number] No Take 1 tablet (20 mg total) by mouth daily. May take additional 20mg  PO prn for leg swelling Calista Catching, FNP 10/15/2023 Active Self, Pharmacy Records  losartan -hydrochlorothiazide  (HYZAAR) 50-12.5 MG tablet [id number] No Take 0.5 tablets by mouth daily as needed (for BP 140/80). Calista Catching, FNP Past Month Active Self,  Pharmacy Records           Med Note Blondie [facility]   Fri Aug 25, 2023  5:53 PM) prn  Multiple Vitamin (MULTIVITAMIN) tablet [id number] No Take 1 tablet by mouth daily. [provider] 10/15/2023 Active Self, Pharmacy Records  ondansetron  (ZOFRAN ) 4 MG tablet [id number]  Take 1 tablet (4 mg total) by mouth every 6 (six) hours as needed for nausea. Coralyn Derry, PA-C  Active Self  oxyCODONE  (ROXICODONE ) 5 MG immediate release tablet [id number]  Take 0.5 tablets (2.5 mg total) by mouth every 8 (eight) hours as needed for breakthrough pain. Coralyn Derry, PA-C  Active   potassium chloride  SA (KLOR-CON  M) 20 MEQ tablet [id number] No Take 1 tablet (20 mEq total) by mouth daily.  Patient taking differently: Take 20 mEq by mouth every morning. And if she takes additional Lasix  she will take another K+   Gollan, Timothy J, MD 10/15/2023 Active Self, Pharmacy Records  Discontinued 03/02/22 1309 ranolazine  (RANEXA ) 500 MG 12 hr  tablet [id number] No TAKE 1 TABLET BY MOUTH TWICE A DAY Gollan, Timothy J, MD 10/15/2023 Active Self, Pharmacy Records  rOPINIRole  (REQUIP ) 2 MG tablet [id number] No Take 2 mg by mouth at bedtime. [provider] 10/15/2023 Bedtime Active Self, Pharmacy Records  rosuvastatin  (CRESTOR ) 40 MG tablet [id number] No TAKE 1 TABLET BY MOUTH EVERY DAY Calista Catching, FNP 10/15/2023 Active Self, Pharmacy Records  traMADol  (ULTRAM ) 50 MG tablet [id number]  Take 1-2 tablets (50-100 mg total) by mouth every 6 (six) hours as needed for moderate pain (pain score 4-6). Coralyn Derry, PA-C  Active   Med List Note Blondie [facility], CPhT 04/06/21 0113): UDS 02/27/2018             Recommendation:   Pain medication as directed. Stay ahead of your pain Take Colace and Prunes daily to prevent constipation Follow your providers recommendations regarding exercises and weight bearing Participate in HHPT as able Adequate rest to left hip to promote healing  Follow Up Plan:    Telephone follow-up in 1 week  Gareld June, BSN, RN [place]  VBCI - [facility] RN Care Manager [phone]

## 2023-11-01 NOTE — Patient Instructions (Signed)
 Visit Information  Thank you for taking time to visit with me today. Please don't hesitate to contact me if I can be of assistance to you before our next scheduled telephone appointment.  Our next appointment is by telephone on Thursday May 1st at 1:00pm  Following is a copy of your care plan:   Goals Addressed             This Visit's Progress    VBCI Transitions of Care (TOC) Care Plan   On track    Problems: (reviewed 11/01/23) Recent Hospitalization for treatment of  Infection Equipment/DME barrier Equipment for Wound Vac Wound vac discontinued 11/01/23  Goal: (reviewed 11/01/23)   Goal: On track Over the next 30 days, the patient will not experience hospital readmission  Interventions: (reviewed 11/01/23)  Surgery (THR and post op infection 10/16/23): Evaluation of current treatment plan related to Post Op infection for [facility] surgery assessed patient/caregiver understanding of surgical procedure   reviewed post-operative instructions with patient/caregiver addressed questions about post - surgical incision care  reviewed medications with patient and addressed questions reviewed scheduled provider appointments with patientAppointment 10/27/23 with Dr. Clyda Dark  - Completed Leave dressing in place in place until the patient returns to see Dr. Clyda Dark April 30 Self administration of IV abx through PORT daily Exercises as instructed by Orthopedic surgeon. Do not put weight on left leg  Patient Self Care Activities:  (reviewed 11/01/23) Attend all scheduled provider appointments Call pharmacy for medication refills 3-7 days in advance of running out of medications Call provider office for new concerns or questions  Notify RN Care Manager of [facility] call rescheduling needs Participate in Transition of Care Program/Attend TOC scheduled calls Perform all self care activities independently  Take medications as prescribed    Plan: (reviewed 11/01/23) Telephone follow up appointment with care  management team member scheduled for:  Thursday May 1 at 1:00pm          Patient verbalizes understanding of instructions and care plan provided today and agrees to view in MyChart. Active MyChart status and patient understanding of how to access instructions and care plan via MyChart confirmed with patient.     The patient has been provided with contact information for the care management team and has been advised to call with any health related questions or concerns.   Please call the care guide team at [phone] if you need to cancel or reschedule your appointment.   Please call the Suicide and Crisis Lifeline: 988 call the [place]  National Suicide Prevention Lifeline: 585-043-7002 or TTY: 762-113-8107 TTY 201-520-9747) to talk to a trained counselor if you are experiencing a Mental Health or Behavioral Health Crisis or need someone to talk to.  Gareld June, BSN, RN [place]  VBCI - [employer] [facility] RN Care Manager [phone]

## 2023-11-02 LAB — AEROBIC/ANAEROBIC CULTURE W GRAM STAIN (SURGICAL/DEEP WOUND): Special Requests: NORMAL

## 2023-11-02 LAB — LAB REPORT - SCANNED: EGFR: 71

## 2023-11-03 ENCOUNTER — Emergency Department

## 2023-11-03 ENCOUNTER — Emergency Department
Admission: EM | Admit: 2023-11-03 | Discharge: 2023-11-03 | Disposition: A | Discharge status: Home / Self Care | Attending: Emergency Medicine | Admitting: Emergency Medicine

## 2023-11-03 ENCOUNTER — Other Ambulatory Visit: Payer: Self-pay

## 2023-11-03 DIAGNOSIS — I4891 Unspecified atrial fibrillation: Secondary | ICD-10-CM | POA: Diagnosis not present

## 2023-11-03 DIAGNOSIS — N281 Cyst of kidney, acquired: Secondary | ICD-10-CM | POA: Diagnosis not present

## 2023-11-03 DIAGNOSIS — N182 Chronic kidney disease, stage 2 (mild): Secondary | ICD-10-CM | POA: Diagnosis not present

## 2023-11-03 DIAGNOSIS — I130 Hypertensive heart and chronic kidney disease with heart failure and stage 1 through stage 4 chronic kidney disease, or unspecified chronic kidney disease: Secondary | ICD-10-CM | POA: Diagnosis not present

## 2023-11-03 DIAGNOSIS — Z96643 Presence of artificial hip joint, bilateral: Secondary | ICD-10-CM | POA: Diagnosis not present

## 2023-11-03 DIAGNOSIS — K529 Noninfective gastroenteritis and colitis, unspecified: Secondary | ICD-10-CM | POA: Diagnosis not present

## 2023-11-03 DIAGNOSIS — E860 Dehydration: Secondary | ICD-10-CM

## 2023-11-03 DIAGNOSIS — J449 Chronic obstructive pulmonary disease, unspecified: Secondary | ICD-10-CM | POA: Diagnosis not present

## 2023-11-03 DIAGNOSIS — I2510 Atherosclerotic heart disease of native coronary artery without angina pectoris: Secondary | ICD-10-CM | POA: Insufficient documentation

## 2023-11-03 DIAGNOSIS — I482 Chronic atrial fibrillation, unspecified: Secondary | ICD-10-CM

## 2023-11-03 DIAGNOSIS — E039 Hypothyroidism, unspecified: Secondary | ICD-10-CM | POA: Diagnosis not present

## 2023-11-03 DIAGNOSIS — K5730 Diverticulosis of large intestine without perforation or abscess without bleeding: Secondary | ICD-10-CM | POA: Diagnosis not present

## 2023-11-03 DIAGNOSIS — Z96652 Presence of left artificial knee joint: Secondary | ICD-10-CM | POA: Insufficient documentation

## 2023-11-03 DIAGNOSIS — I509 Heart failure, unspecified: Secondary | ICD-10-CM | POA: Diagnosis not present

## 2023-11-03 DIAGNOSIS — R1111 Vomiting without nausea: Secondary | ICD-10-CM | POA: Diagnosis not present

## 2023-11-03 DIAGNOSIS — N200 Calculus of kidney: Secondary | ICD-10-CM | POA: Diagnosis not present

## 2023-11-03 DIAGNOSIS — R112 Nausea with vomiting, unspecified: Secondary | ICD-10-CM | POA: Diagnosis present

## 2023-11-03 LAB — CBC WITH DIFFERENTIAL/PLATELET
Abs Immature Granulocytes: 0.02 10*3/uL (ref 0.00–0.07)
Basophils Absolute: 0 10*3/uL (ref 0.0–0.1)
Basophils Relative: 0 %
Eosinophils Absolute: 0 10*3/uL (ref 0.0–0.5)
Eosinophils Relative: 0 %
HCT: 34.5 % — ABNORMAL LOW (ref 36.0–46.0)
Hemoglobin: 11.7 g/dL — ABNORMAL LOW (ref 12.0–15.0)
Immature Granulocytes: 0 %
Lymphocytes Relative: 13 %
Lymphs Abs: 0.8 10*3/uL (ref 0.7–4.0)
MCH: 30.4 pg (ref 26.0–34.0)
MCHC: 33.9 g/dL (ref 30.0–36.0)
MCV: 89.6 fL (ref 80.0–100.0)
Monocytes Absolute: 0.4 10*3/uL (ref 0.1–1.0)
Monocytes Relative: 7 %
Neutro Abs: 4.9 10*3/uL (ref 1.7–7.7)
Neutrophils Relative %: 80 %
Platelets: 205 10*3/uL (ref 150–400)
RBC: 3.85 MIL/uL — ABNORMAL LOW (ref 3.87–5.11)
RDW: 14.1 % (ref 11.5–15.5)
WBC: 6.2 10*3/uL (ref 4.0–10.5)
nRBC: 0 % (ref 0.0–0.2)

## 2023-11-03 LAB — COMPREHENSIVE METABOLIC PANEL WITH GFR
ALT: 15 U/L (ref 0–44)
AST: 22 U/L (ref 15–41)
Albumin: 3.4 g/dL — ABNORMAL LOW (ref 3.5–5.0)
Alkaline Phosphatase: 74 U/L (ref 38–126)
Anion gap: 8 (ref 5–15)
BUN: 15 mg/dL (ref 8–23)
CO2: 26 mmol/L (ref 22–32)
Calcium: 8.7 mg/dL — ABNORMAL LOW (ref 8.9–10.3)
Chloride: 101 mmol/L (ref 98–111)
Creatinine, Ser: 0.83 mg/dL (ref 0.44–1.00)
GFR, Estimated: >60 mL/min (ref >60)
Glucose, Bld: 110 mg/dL — ABNORMAL HIGH (ref 70–99)
Potassium: 3.5 mmol/L (ref 3.5–5.1)
Sodium: 135 mmol/L (ref 135–145)
Total Bilirubin: 0.9 mg/dL (ref 0.0–1.2)
Total Protein: 7 g/dL (ref 6.5–8.1)

## 2023-11-03 LAB — LIPASE, BLOOD: Lipase: 25 U/L (ref 11–51)

## 2023-11-03 MED ORDER — SODIUM CHLORIDE 0.9% FLUSH: 10.0000 mL | INTRAVENOUS | Status: DC | PRN

## 2023-11-03 MED ORDER — CHLORHEXIDINE GLUCONATE CLOTH 2 % EX PADS
6.0000 | MEDICATED_PAD | Freq: Every day | CUTANEOUS | Status: DC
  Administered 2023-11-03: 6 via TOPICAL
  Filled 2023-11-03: qty 6

## 2023-11-03 MED ORDER — SODIUM CHLORIDE 0.9% FLUSH
10.0000 mL | Freq: Two times a day (BID) | INTRAVENOUS | Status: DC
  Administered 2023-11-03: 10 mL

## 2023-11-03 MED ORDER — SODIUM CHLORIDE 0.9 % IV BOLUS
1000.0000 mL | Freq: Once | INTRAVENOUS | Status: AC
  Administered 2023-11-03: 1000 mL via INTRAVENOUS

## 2023-11-03 MED ORDER — METOCLOPRAMIDE HCL 10 MG PO TABS: 10.0000 mg | ORAL_TABLET | Freq: Four times a day (QID) | ORAL | 0 refills | Status: AC | PRN

## 2023-11-03 MED ORDER — IOHEXOL 300 MG/ML  SOLN
100.0000 mL | Freq: Once | INTRAMUSCULAR | Status: AC | PRN
  Administered 2023-11-03: 100 mL via INTRAVENOUS

## 2023-11-03 MED ORDER — PANTOPRAZOLE SODIUM 40 MG IV SOLR
40.0000 mg | Freq: Once | INTRAVENOUS | Status: AC
  Administered 2023-11-03: 40 mg via INTRAVENOUS
  Filled 2023-11-03: qty 10

## 2023-11-03 MED ORDER — FAMOTIDINE 20 MG PO TABS: 20.0000 mg | ORAL_TABLET | Freq: Two times a day (BID) | ORAL | 0 refills | Status: AC

## 2023-11-03 MED ORDER — METOCLOPRAMIDE HCL 5 MG/ML IJ SOLN
10.0000 mg | INTRAMUSCULAR | Status: AC
  Administered 2023-11-03: 10 mg via INTRAVENOUS
  Filled 2023-11-03: qty 2

## 2023-11-03 NOTE — Progress Notes (Signed)
 AT bedside for IVT consult. Pt noted to have a PAC previously accessed per cancer center (per pt). Dressing intact with good blood return. Instructed primary RN to use the PAC, as it is accessed and ready to use. No clinical indication for PIV access at this time.

## 2023-11-03 NOTE — ED Triage Notes (Signed)
 Pt BIB [facility] from home c/o [facility] x 48 hours. Pt reports vomiting at least x6 times in the past 24 hours. Pt denies recent illness or diarrhea. Pt was recently hospitalized for a recent hip surgery complication. Pt has been taking PO Zofran  prescribed for post surgery and reports no relief.   97 temp 73 HR 166/101

## 2023-11-03 NOTE — ED Provider Notes (Signed)
 [facility] Provider Note    Event Date/Time   First MD Initiated Contact with Patient 11/03/23 0820     (approximate)   History   Chief Complaint: Nausea (vomit) and Emesis   HPI  Amber Barnes is a 73 y.o. female with a history of CAD, atrial fibrillation, GERD, endometriosis, CKD who comes ED complaining of nausea vomiting for the last 2 days associated with diarrhea as well.  Poor oral intake.  Currently on daptomycin   due to infection and left hip where she recently had surgery.  Denies chest pain shortness of breath or worsening pain or swelling in the left hip.  Does endorse generalized abdominal pain.        Past Medical History:  Diagnosis Date   Achilles tendinitis    Acute medial meniscal tear    Allergy    Anemia    Anxiety    a.) on BZO PRN (alprazolam )   Aortic atherosclerosis (HCC)    Ascending aorta dilatation (HCC) 10/20/2022   a.) TTE 10/20/2022: asc Ao 39 mm   Atrial fibrillation/flutter (HCC) 07/2016   a.) Dx'd 07/2016; b.) CHA2DS2-VASc = 5 (age, sex, HFmrEF, HTN, vasc disease history) as of 05/09/2023; c.) s/p DCCV 09/07/2016 (150 J x 2 --> A.flutter; 200 J x 1 --> NSR); d.) s/p DCCV 10/19/2016 (150 J x 1 --> NSR); e.) brought in for DCCV 12/2016; cancelled d/t NSR; f.) s/p PVI ablation 12/12/2022; g.) cardiac rate/rhythm maintained on oral bisoprolol ; chronically anticoagulated using apixaban    BMI 50.0-59.9, adult (HCC) 07/16/2018   CAD (coronary artery disease)    a.) cCTA 12/08/2022: Ca2+ = 31.6 (58th %'ile for age/sex/race match control)   Cardiomegaly    Chemotherapy induced nausea and vomiting 05/18/2021   Chronic heart failure with mid-range ejection fraction (HFmrEF) (HCC)    a.) TTE 03/08/21: EF 60-65, mild LVH, G1DD, mild LAE, triv MR; b.) TTE 07/29/21: EF 60-65, G1DD, mild LAE, triv MR; c.) TTE 11/26/21: EF 55-60, G1DD, mild LAE, mild MR; d.) TTE 02/25/22: EF 60-65, G1DD, mild LAE, mild MR; e.) TTE 05/31/22: EF 55-60,  mild LVH, G2DD, mild-mod MR; f.) TTE 10/20/22: EF 45-50, mild LAE, mod red RVSF, mild-mod TR, Asc Ao 39; g.) TTE 12/13/22: EF 50-55, mild LVH, mild MAC   CKD (chronic kidney disease), stage II    Colon polyp    Complication of anesthesia    a.) emergence delirium; b.) hypoxia after hip replacement   COPD (chronic obstructive pulmonary disease) (HCC)    DDD (degenerative disc disease), lumbar    a.) s/p fusion   Depression    Endometriosis    Family history of CLL (chronic lymphoid leukemia) 03/21/2020   Fibrocystic breast disease    GERD (gastroesophageal reflux disease)    History of kidney stones    History of stress test    a.) MV 07/29/2016: EF 55-65%. Small, mild defect  in mid anteroseptal, apical septal, and apical locations. No ischemia-->low risk; b.) MV 10/2022: mod sized partially rev apical ant, apical septal, and apical defect. EF 52%. No signific cor Ca2+; c.) cCTA 12/08/2022: Ca2+ = 31.6 (58th %'ile).   History of tobacco use    Hyperlipidemia    Hypertension    Hypothyroidism    a.) s/p partial thyroidectomy for nodule; pathology benign   Insomnia    Left knee pain, unspecified chronicity    Lipoma    Malignant neoplasm of upper-outer quadrant of right female breast (HCC) 03/03/2021   a.) stage  1A invasive mammary carcinoma (cT1b or T1c N0); ER (-), PR (+), HER2/neu (+); did not tolerate adjuvant chemotherapy (paclitacel + trastuzumab ) despite paclitaxel  dose reduction; s/p adjuvant XRT; cinished maintenance trastuzumab  07/27/2022; no adjuvant endocrine therapy as no clear benefit   Migraine    Nephrolithiasis    On apixaban  therapy    OSA (obstructive sleep apnea)    a.) non-compliant with nocturnal PAP therapy   Osteoarthritis    Osteopenia    PAF (paroxysmal atrial fibrillation) (HCC) 12/13/2022   Peptic ulcer disease    a.) H. pylori   Pericarditis 12/12/2022   a.) following A.fib ablation; resolved   Pneumonia    Port-A-Cath in place    Primary localized  osteoarthritis of pelvic region and thigh 03/02/2022   RLS (restless legs syndrome)    a.) on ropinirole    S/P TKR (total knee replacement), left 09/11/2023   S/P total left hip arthroplasty 05/11/2023   Vertigo 06/08/2016   Vitamin D  deficiency     Current Outpatient Rx   Order #: [id number] Class: Normal   Order #: [id number] Class: Normal   Order #: [id number] Class: Normal   Order #: [id number] Class: Normal   Order #: [id number] Class: Normal   Order #: [id number] Class: Normal   Order #: [id number] Class: Historical Med   Order #: [id number] Class: Historical Med   Order #: [id number] Class: Print   Order #: [id number] Class: Normal   Order #: [id number] Class: Normal   Order #: [id number] Class: Normal   Order #: [id number] Class: Normal   Order #: [id number] Class: Normal   Order #: [id number] Class: Historical Med   Order #: [id number] Class: Normal   Order #: [id number] Class: Print   Order #: [id number] Class: Normal   Order #: [id number] Class: Normal   Order #: [id number] Class: Historical Med   [Paused] Order #: [id number] Class: Normal   Order #: [id number] Class: Print    Past Surgical History:  Procedure Laterality Date   ABDOMINAL HYSTERECTOMY     ovaries left   ACHILLES TENDON SURGERY     2012,01/2017   ATRIAL FIBRILLATION ABLATION N/A 12/12/2022   Procedure: ATRIAL FIBRILLATION ABLATION;  Surgeon: Boyce Byes, MD;  Location: [facility] INVASIVE CV LAB;  Service: Cardiovascular;  Laterality: N/A;   BREAST BIOPSY Right 01/15/2021   Affirm biopsy-"X" clip-INVASIVE MAMMARY CARCINOMA   BREAST BIOPSY Right 01/15/2021   u/s bx-"venus" clip INVASIVE MAMMARY CARCINOMA   BREAST BIOPSY     BREAST BIOPSY Right 01/04/2023   u/s bx ribbon clip path pending   BREAST BIOPSY Right 01/04/2023   US  RT BREAST BX W LOC DEV 1ST LESION IMG BX SPEC US  GUIDE 01/04/2023 [facility]   BREAST LUMPECTOMY,RADIO FREQ LOCALIZER,AXILLARY SENTINEL LYMPH NODE BIOPSY Right 02/17/2021   Procedure: BREAST LUMPECTOMY,RADIO  FREQ LOCALIZER,AXILLARY SENTINEL LYMPH NODE BIOPSY;  Surgeon: Flynn Hylan, MD;  Location: [facility];  Service: General;  Laterality: Right;   CARDIOVERSION N/A 09/07/2016   Procedure: CARDIOVERSION;  Surgeon: Devorah Fonder, MD;  Location: [facility];  Service: Cardiovascular;  Laterality: N/A;   CARDIOVERSION N/A 10/19/2016   Procedure: CARDIOVERSION;  Surgeon: Devorah Fonder, MD;  Location: [facility];  Service: Cardiovascular;  Laterality: N/A;   CARDIOVERSION N/A 12/14/2016   Procedure: CARDIOVERSION;  Surgeon: Devorah Fonder, MD;  Location: [facility];  Service: Cardiovascular;  Laterality: N/A;   COLONOSCOPY     COLONOSCOPY WITH PROPOFOL  N/A 05/13/2020   Procedure: COLONOSCOPY WITH PROPOFOL ;  Surgeon: Toledo, Alphonsus Jeans, MD;  Location: [facility];  Service: Gastroenterology;  Laterality: N/A;  ELIQUIS    CYSTOSCOPY WITH STENT PLACEMENT  Bilateral 07/12/2021   Procedure: CYSTOSCOPY WITH STENT PLACEMENT;  Surgeon: Lawerence Pressman, MD;  Location: [facility];  Service: Urology;  Laterality: Bilateral;   CYSTOSCOPY/URETEROSCOPY/HOLMIUM LASER/STENT PLACEMENT Bilateral 07/30/2021   Procedure: CYSTOSCOPY/URETEROSCOPY/HOLMIUM LASER/BILATERAL STENT REMOVAL;  Surgeon: Lawerence Pressman, MD;  Location: [facility];  Service: Urology;  Laterality: Bilateral;   ESOPHAGOGASTRODUODENOSCOPY     ESOPHAGOGASTRODUODENOSCOPY (EGD) WITH PROPOFOL  N/A 05/13/2020   Procedure: ESOPHAGOGASTRODUODENOSCOPY (EGD) WITH PROPOFOL ;  Surgeon: Toledo, Alphonsus Jeans, MD;  Location: [facility];  Service: Gastroenterology;  Laterality: N/A;   EXCISIONAL TOTAL HIP ARTHROPLASTY WITH ANTIBIOTIC SPACERS Left 10/17/2023   Procedure: EXCISIONAL TOTAL HIP ARTHROPLASTY WITH PLACEMENT OF ANTIBIOTIC BEADS AND DRAIN;  Surgeon: Venus Ginsberg, MD;  Location: [facility];  Service: Orthopedics;  Laterality: Left;   FOOT SURGERY Left    tendon   LUMBAR FUSION     PORTA CATH INSERTION N/A 03/23/2021   Procedure: PORTA CATH INSERTION;   Surgeon: Jackquelyn Mass, MD;  Location: [facility]IVE [facility];  Service: Cardiovascular;  Laterality: N/A;   PORTACATH PLACEMENT N/A 03/23/2021   Procedure: ATTEMPTED INSERTION PORT-A-CATH;  Surgeon: Flynn Hylan, MD;  Location: [facility];  Service: General;  Laterality: N/A;   THYROIDECTOMY  1990   half thyroid  lobectomy secondary to a benign nodule   TOTAL HIP ARTHROPLASTY Right 08/21/2018   Procedure: TOTAL HIP ARTHROPLASTY ANTERIOR APPROACH-RIGHT CELL SAVER REQUESTED;  Surgeon: Molli Angelucci, MD;  Location: [facility];  Service: Orthopedics;  Laterality: Right;   TOTAL HIP ARTHROPLASTY Left 05/11/2023   Procedure: TOTAL HIP ARTHROPLASTY ANTERIOR APPROACH;  Surgeon: Venus Ginsberg, MD;  Location: [facility];  Service: Orthopedics;  Laterality: Left;   TOTAL KNEE ARTHROPLASTY Left 09/11/2023   Procedure: ARTHROPLASTY, KNEE, TOTAL;  Surgeon: Venus Ginsberg, MD;  Location: [facility];  Service: Orthopedics;  Laterality: Left;    Physical Exam   Triage Vital Signs: ED Triage Vitals  Encounter Vitals Group     BP 11/03/23 0819 (!) 188/96     Systolic BP Percentile --      Diastolic BP Percentile --      Pulse Rate 11/03/23 0819 64     Resp 11/03/23 0819 18     Temp 11/03/23 0819 98.3 F (36.8 C)     Temp Source 11/03/23 0819 Oral     SpO2 11/03/23 0819 99 %     Weight 11/03/23 0823 238 lb (108 kg)     Height 11/03/23 0823 5\' 5"  (1.651 m)     Head Circumference --      Peak Flow --      Pain Score 11/03/23 0823 10     Pain Loc --      Pain Education --      Exclude from Growth Chart --     Most recent vital signs: Vitals:   11/03/23 1100 11/03/23 1236  BP: 130/80   Pulse: 73   Resp: 19   Temp:  98.6 F (37 C)  SpO2: 98%     General: Awake, no distress.  CV:  Good peripheral perfusion.  Regular rate rhythm Resp:  Normal effort.  Clear to auscultation bilaterally Abd:  No distention.  Soft with mild generalized tenderness. Other:  Dry oral mucosa.  Left hip dressing  intact, clean, no inflammatory changes about the incision.  No significant soft tissue edema or swelling or tenderness.   ED Results / Procedures / Treatments   Labs (all labs ordered are listed, but only abnormal results are displayed) Labs Reviewed  COMPREHENSIVE METABOLIC PANEL WITH GFR - Abnormal; Notable for the following components:      Result Value   Glucose, Bld 110 (*)    Calcium  8.7 (*)    Albumin  3.4 (*)    All other components within normal limits  CBC WITH DIFFERENTIAL/PLATELET - Abnormal; Notable for the following components:   RBC 3.85 (*)    Hemoglobin 11.7 (*)    HCT 34.5 (*)    All other components within normal limits  LIPASE, BLOOD     EKG    RADIOLOGY CT abdomen pelvis interpreted by me, no evidence of perforation, obstruction, or intra-abdominal abscess.  Radiology report reviewed   PROCEDURES:  Procedures   MEDICATIONS ORDERED IN ED: Medications  sodium chloride  flush (NS) 0.9 % injection 10-40 mL (10 mLs Intracatheter Given 11/03/23 0915)  sodium chloride  flush (NS) 0.9 % injection 10-40 mL (has no administration in time range)  Chlorhexidine  Gluconate Cloth 2 % PADS 6 each (6 each Topical Given 11/03/23 0915)  sodium chloride  0.9 % bolus 1,000 mL (1,000 mLs Intravenous New Bag/Given 11/03/23 0913)  metoCLOPramide  (REGLAN ) injection 10 mg (10 mg Intravenous Given 11/03/23 0909)  pantoprazole  (PROTONIX ) injection 40 mg (40 mg Intravenous Given 11/03/23 0910)  iohexol  (OMNIPAQUE ) 300 MG/ML solution 100 mL (100 mLs Intravenous Contrast Given 11/03/23 1203)     IMPRESSION / MDM / ASSESSMENT AND PLAN / ED COURSE  I reviewed the triage vital signs and the nursing notes.  DDx: Dehydration, electrolyte derangement, AKI, bowel obstruction, perforation, viral gastroenteritis, pancreatitis  Patient's presentation is most consistent with acute presentation with potential threat to life or bodily function.  Patient presents with abdominal pain, nausea  vomiting diarrhea.  Appears dehydrated.  Vital signs are unremarkable, with age and comorbidities, will obtain labs, give IV fluids and antiemetics/antacids   Clinical Course as of 11/03/23 1436  Fri Nov 03, 2023  1026 Patient reports persistent nausea and generalized abdominal pain, has mild generalized tenderness still despite fluids and medications.  Will obtain CT. [PS]    Clinical Course User Index [PS] Jacquie Maudlin, MD    ----------------------------------------- 2:35 PM on 11/03/2023 ----------------------------------------- Feeling much better, pain and nausea resolved.  CT reassuring.  Stable for discharge home with reassuring workup   FINAL CLINICAL IMPRESSION(S) / ED DIAGNOSES   Final diagnoses:  Gastroenteritis  Dehydration  Chronic atrial fibrillation (HCC)     Rx / DC Orders   ED Discharge Orders          Ordered    famotidine  (PEPCID ) 20 MG tablet  2 times daily        11/03/23 1432    metoCLOPramide  (REGLAN ) 10 MG tablet  Every 6 hours PRN        11/03/23 1432             Note:  This document was prepared using Dragon voice recognition software and may include unintentional dictation errors.   Jacquie Maudlin, MD 11/03/23 [phone]

## 2023-11-03 NOTE — ED Notes (Signed)
 Pt given water and crackers for PO challenge

## 2023-11-03 NOTE — ED Notes (Signed)
 Pt ambulated to bedside commode with steady gate and returned to bed without incident. CB within reach. NAD

## 2023-11-04 DIAGNOSIS — T8452XA Infection and inflammatory reaction due to internal left hip prosthesis, initial encounter: Secondary | ICD-10-CM | POA: Diagnosis not present

## 2023-11-07 DIAGNOSIS — T8452XA Infection and inflammatory reaction due to internal left hip prosthesis, initial encounter: Secondary | ICD-10-CM | POA: Diagnosis not present

## 2023-11-09 ENCOUNTER — Other Ambulatory Visit: Payer: Self-pay

## 2023-11-09 ENCOUNTER — Encounter: Payer: Self-pay | Admitting: Infectious Diseases

## 2023-11-09 NOTE — Patient Instructions (Signed)
 Visit Information  Thank you for taking time to visit with me today. Please don't hesitate to contact me if I can be of assistance to you before our next scheduled telephone appointment.  Our next appointment is by telephone on Thursday May 8th at 11:00am  Following is a copy of your care plan:   Goals Addressed             This Visit's Progress    [facility]I Transitions of Care (TOC) Care Plan   On track    Problems: (reviewed 11/09/23) Recent Hospitalization for treatment of  Infection Equipment/DME barrier Equipment for Wound Vac Wound vac discontinued 11/01/23  Goal: (reviewed 11/09/23) Goal: On track Over the next 30 days, the patient will not experience hospital readmission Surgery (THR and post op infection 10/16/23): Evaluation of current treatment plan related to Post Op infection for [facility] surgery assessed patient/caregiver understanding of surgical procedure   reviewed post-operative instructions with patient/caregiver addressed questions about post - surgical incision care  reviewed medications with patient and addressed questions reviewed scheduled provider appointments with patientAppointment 10/27/23 with Dr. Clyda Dark  - Completed Leave dressing in place in place for one week and assess for drainage. Call Dr, Noberto Bastos after one week to update - Due 5/7 Self administration of IV abx through PORT daily Exercises as instructed by Orthopedic surgeon. Do not put weight on left leg Assess Pain level daily. Report pain that is greater than a level 8 to the provider. Medicated with Tramadol  and Tylenol  as directed  Patient Self Care Activities:  (reviewed 11/09/23) Attend all scheduled provider appointments Call pharmacy for medication refills 3-7 days in advance of running out of medications Call provider office for new concerns or questions  Notify RN Care Manager of [facility] call rescheduling needs Participate in Transition of Care Program/Attend [facility] scheduled calls Perform all self care  activities independently  Take medications as prescribed    Plan: (reviewed 11/09/23) Telephone follow up appointment with care management team member scheduled for:  Thursday May 8 at 11:00pm          Patient verbalizes understanding of instructions and care plan provided today and agrees to view in MyChart. Active MyChart status and patient understanding of how to access instructions and care plan via MyChart confirmed with patient.     The patient has been provided with contact information for the care management team and has been advised to call with any health related questions or concerns.   Please call the care guide team at [phone] if you need to cancel or reschedule your appointment.   Please call the Suicide and Crisis Lifeline: 988 call the [place]  National Suicide Prevention Lifeline: (765) 083-6866 or TTY: 469-674-9183 TTY (864)453-4856) to talk to a trained counselor if you are experiencing a Mental Health or Behavioral Health Crisis or need someone to talk to.  Gareld June, BSN, RN [place]  [facility]BCI - [employer] [facility] RN Care Manager [phone]

## 2023-11-09 NOTE — Transitions of Care (Post Inpatient/ED Visit) (Signed)
 Transition of Care week 3  Visit Note  11/09/2023  Name: Amber Barnes MRN: [id number]          DOB: 05/23/1951  Situation: Patient enrolled in [facility] 30-day program. Visit completed with Anton Kirsten by telephone.   Background:    Past Medical History:  Diagnosis Date   Achilles tendinitis    Acute medial meniscal tear    Allergy    Anemia    Anxiety    a.) on BZO PRN (alprazolam )   Aortic atherosclerosis (HCC)    Ascending aorta dilatation (HCC) 10/20/2022   a.) TTE 10/20/2022: asc Ao 39 mm   Atrial fibrillation/flutter (HCC) 07/2016   a.) Dx'd 07/2016; b.) CHA2DS2-VASc = 5 (age, sex, HFmrEF, HTN, vasc disease history) as of 05/09/2023; c.) s/p DCCV 09/07/2016 (150 J x 2 --> A.flutter; 200 J x 1 --> NSR); d.) s/p DCCV 10/19/2016 (150 J x 1 --> NSR); e.) brought in for DCCV 12/2016; cancelled d/t NSR; f.) s/p PVI ablation 12/12/2022; g.) cardiac rate/rhythm maintained on oral bisoprolol ; chronically anticoagulated using apixaban    BMI 50.0-59.9, adult (HCC) 07/16/2018   CAD (coronary artery disease)    a.) cCTA 12/08/2022: Ca2+ = 31.6 (58th %'ile for age/sex/race match control)   Cardiomegaly    Chemotherapy induced nausea and vomiting 05/18/2021   Chronic heart failure with mid-range ejection fraction (HFmrEF) (HCC)    a.) TTE 03/08/21: EF 60-65, mild LVH, G1DD, mild LAE, triv MR; b.) TTE 07/29/21: EF 60-65, G1DD, mild LAE, triv MR; c.) TTE 11/26/21: EF 55-60, G1DD, mild LAE, mild MR; d.) TTE 02/25/22: EF 60-65, G1DD, mild LAE, mild MR; e.) TTE 05/31/22: EF 55-60, mild LVH, G2DD, mild-mod MR; f.) TTE 10/20/22: EF 45-50, mild LAE, mod red RVSF, mild-mod TR, Asc Ao 39; g.) TTE 12/13/22: EF 50-55, mild LVH, mild MAC   CKD (chronic kidney disease), stage II    Colon polyp    Complication of anesthesia    a.) emergence delirium; b.) hypoxia after hip replacement   COPD (chronic obstructive pulmonary disease) (HCC)    DDD (degenerative disc disease), lumbar    a.) s/p fusion   Depression     Endometriosis    Family history of CLL (chronic lymphoid leukemia) 03/21/2020   Fibrocystic breast disease    GERD (gastroesophageal reflux disease)    History of kidney stones    History of stress test    a.) MV 07/29/2016: EF 55-65%. Small, mild defect  in mid anteroseptal, apical septal, and apical locations. No ischemia-->low risk; b.) MV 10/2022: mod sized partially rev apical ant, apical septal, and apical defect. EF 52%. No signific cor Ca2+; c.) cCTA 12/08/2022: Ca2+ = 31.6 (58th %'ile).   History of tobacco use    Hyperlipidemia    Hypertension    Hypothyroidism    a.) s/p partial thyroidectomy for nodule; pathology benign   Insomnia    Left knee pain, unspecified chronicity    Lipoma    Malignant neoplasm of upper-outer quadrant of right female breast (HCC) 03/03/2021   a.) stage 1A invasive mammary carcinoma (cT1b or T1c N0); ER (-), PR (+), HER2/neu (+); did not tolerate adjuvant chemotherapy (paclitacel + trastuzumab ) despite paclitaxel  dose reduction; s/p adjuvant XRT; cinished maintenance trastuzumab  07/27/2022; no adjuvant endocrine therapy as no clear benefit   Migraine    Nephrolithiasis    On apixaban  therapy    OSA (obstructive sleep apnea)    a.) non-compliant with nocturnal PAP therapy   Osteoarthritis    Osteopenia  PAF (paroxysmal atrial fibrillation) (HCC) 12/13/2022   Peptic ulcer disease    a.) H. pylori   Pericarditis 12/12/2022   a.) following A.fib ablation; resolved   Pneumonia    Port-A-Cath in place    Primary localized osteoarthritis of pelvic region and thigh 03/02/2022   RLS (restless legs syndrome)    a.) on ropinirole    S/P TKR (total knee replacement), left 09/11/2023   S/P total left hip arthroplasty 05/11/2023   Vertigo 06/08/2016   Vitamin D  deficiency     Assessment: TOC Outreach completed to the patient today. She states she is feeling better this week since she went to the ED on 4/25 due to N/V/D. She received fluids and was told  to stop taking Oxycodone . She states she hasn't had any Oxycodone  and her symptoms have improved. She has become more mobile and going out for several hours. Her staples have been removed. She has one area that continues to drain and she is to report to the provider next week the amount of drainage. Her pain is managed with Tamadol. She states she will start working with HHPT next. Her follow up with Dr. Clyda Dark is in three weeks.  Patient Reported Symptoms: Cognitive Cognitive Status: Alert and oriented to person, place, and time      Neurological Neurological Review of Symptoms: No symptoms reported    HEENT HEENT Symptoms Reported: No symptoms reported      Cardiovascular Cardiovascular Symptoms Reported: No symptoms reported Weight: 232 lb (105.2 kg)  Respiratory Respiratory Symptoms Reported: No symptoms reported    Endocrine Patient reports the following symptoms related to hypoglycemia or hyperglycemia : No symptoms reported Is patient diabetic?: No    Gastrointestinal Gastrointestinal Symptoms Reported: Abdominal pain or discomfort Additional Gastrointestinal Details: The patient went to the ED due to N/V/D Gastrointestinal Conditions: Diarrhea, Nausea Gastrointestinal Management Strategies: Fluid modification, Adequate rest Gastrointestinal Self-Management Outcome: 4 (good) Gastrointestinal Comment: Believes it was a reaction to Oxycodone  Nutrition Risk Screen (CP): Reduced oral intake over the last month, Unintentional loss of 10 lbs or more in the past 2 months  Genitourinary    No Symptoms Reported  Integumentary  Wound to left hip with one spot that is draining    Musculoskeletal Musculoskelatal Symptoms Reviewed: Difficulty walking, Muscle pain, Weakness Additional Musculoskeletal Details: Still not putting weight on her leg Musculoskeletal Conditions: Back pain, Joint pain Other Musculoskeletal Conditions: THR and TKR in the last 6 months Musculoskeletal Management  Strategies: Medication therapy, Medical device, Activity Musculoskeletal Self-Management Outcome: 3 (uncertain) Musculoskeletal Comment: The patient has been instructed not to use her leg Falls in the past year?: Yes Number of falls in past year: 1 or less Was there an injury with Fall?: No Fall Risk Category Calculator: 1 Patient Fall Risk Level: Low Fall Risk Patient at Risk for Falls Due to: Impaired mobility, Impaired balance/gait Fall risk Follow up: Falls evaluation completed  Psychosocial Psychosocial Symptoms Reported: No symptoms reported         There were no vitals filed for this visit.  Medications Reviewed Today     Reviewed by Claudene Crystal, RN (Case Manager) on 11/09/23 at 1113  Med List Status: <None>   Medication Order Taking? Sig Documenting Provider Last Dose Status Informant  acetaminophen  (TYLENOL ) 500 MG tablet [id number]  Take 2 tablets (1,000 mg total) by mouth every 8 (eight) hours. Coralyn Derry, PA-C  Active Self  ALPRAZolam  (XANAX ) 0.25 MG tablet [id number]  TAKE 1 TABLET BY MOUTH TWICE A  DAY AS NEEDED FOR ANXIETY Calista Catching, FNP  Active Self, Pharmacy Records  bisoprolol  (ZEBETA ) 10 MG tablet [id number]  Take 1 tablet (10 mg total) by mouth 2 (two) times daily. Gollan, Timothy J, MD  Active Self, Pharmacy Records  busPIRone  (BUSPAR ) 5 MG tablet [id number]  TAKE 1 TABLET BY MOUTH THREE TIMES A DAY AS NEEDED Calista Catching, FNP  Active Self  Calcium  Carbonate (CALCIUM  500 PO) [id number]  Take 1 tablet by mouth daily. [provider]  Active Self  Cholecalciferol  (VITAMIN D3) 5000 units CAPS [id number]  Take 5,000 Units by mouth daily. [provider]  Active Self, Pharmacy Records  daptomycin  (CUBICIN ) IVPB [id number]  Inject 600 mg into the vein daily. 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 Please pull PIC at completion of IV antibiotics Call [phone] with critical value or questions Unk Garb, DO  Active   docusate sodium  (COLACE) 100 MG capsule [id number]  Take 1 capsule (100 mg total) by mouth 2 (two) times daily. Coralyn Derry, PA-C  Active Self  ELIQUIS  5 MG TABS tablet [id number]  TAKE 1 TABLET BY MOUTH TWICE A DAY Gollan, Timothy J, MD  Active Self, Pharmacy Records  escitalopram  (LEXAPRO ) 10 MG tablet [id number]  Take 1 tablet (10 mg total) by mouth daily. Calista Catching, FNP  Active Self, Pharmacy Records  famotidine  (PEPCID ) 20 MG tablet [id number]  Take 1 tablet (20 mg total) by mouth 2 (two) times daily. Jacquie Maudlin, MD  Active   furosemide  (LASIX ) 20 MG tablet [id number]  Take 1 tablet (20 mg total) by mouth daily. May take additional 20mg  PO prn for leg swelling Calista Catching, FNP  Active Self, Pharmacy Records  losartan -hydrochlorothiazide  [facility]) 50-12.5 MG tablet [id number]  Take 0.5 tablets by mouth daily as needed (for BP 140/80). Calista Catching, FNP  Active Self, Pharmacy Records           Med Note Blondie Burke   Fri Aug 25, 2023  5:53 PM) prn  metoCLOPramide  (REGLAN ) 10 MG tablet [id number]  Take 1 tablet (10 mg total) by mouth every 6 (six) hours as needed. Jacquie Maudlin, MD  Active   Multiple Vitamin (MULTIVITAMIN) tablet [id number]  Take 1 tablet by mouth daily. [provider]  Active Self, Pharmacy Records  ondansetron  (ZOFRAN ) 4 MG tablet [id number]  Take 1 tablet (4 mg total) by mouth every 6 (six) hours as needed for nausea. Coralyn Derry, PA-C  Active Self  oxyCODONE  (ROXICODONE ) 5 MG immediate release tablet [id number] No Take  0.5 tablets (2.5 mg total) by mouth every 8 (eight) hours as needed for breakthrough pain.  Patient not taking: Reported on 11/09/2023   Coralyn Derry, PA-C Not Taking Active   potassium chloride  SA (KLOR-CON  M) 20 MEQ tablet [id number]  Take 1 tablet (20 mEq total) by mouth daily.  Patient taking differently: Take 20 mEq by mouth every morning. And if she takes additional Lasix  she will take another K+   Gollan, Timothy J, MD  Active Self, Pharmacy Records    Discontinued 03/02/22 1309 ranolazine  (RANEXA ) 500 MG 12  hr tablet [id number]  TAKE 1 TABLET BY MOUTH TWICE A DAY Gollan, Timothy J, MD  Active Self, Pharmacy Records  rOPINIRole  (REQUIP ) 2 MG tablet [id number]  Take 2 mg by mouth at bedtime. [provider]  Active Self, Pharmacy Records  rosuvastatin  (CRESTOR ) 40 MG tablet [id number]  TAKE 1 TABLET BY MOUTH EVERY DAY Arnett, Hanley Lew, FNP  Active Self, Pharmacy Records  traMADol  (ULTRAM ) 50 MG tablet [id number] Yes Take 1-2 tablets (50-100 mg total) by mouth every 6 (six) hours as needed for moderate pain (pain score 4-6). Coralyn Derry, PA-C Taking Active   Med List Note Blondie Burke, CPhT 04/06/21 0113): UDS 02/27/2018             Recommendation:   Continue to follow non weight bearing instructions as directed by the provider Medicate for pain. Report increased pain of greater than 8 to the provider Monitor for increased drainage to the incision. Report increased drainage that soaks through a gauze in one day Activity as tolerated.   Follow Up Plan:   Telephone follow-up in 1 week  Gareld June, BSN, RN [place]  [facility] - [facility] RN Care Manager [phone]

## 2023-11-09 NOTE — Progress Notes (Signed)
 Electrophysiology Clinic Note    Date:  11/10/2023  Patient ID:  Amber Barnes, Amber Barnes [birth year], MRN [id number] PCP:  Calista Catching, FNP  Cardiologist:  Belva Boyden, MD Electrophysiologist: Boyce Byes, MD   Discussed the use of AI scribe software for clinical note transcription with the patient, who gave verbal consent to proceed.   Patient Profile    Chief Complaint: AF follow-up  History of Present Illness: Amber Barnes is a 73 y.o. female with PMH notable for persis Afib, aflutter, CHF, HTN, Breast Ca; seen today for Boyce Byes, MD for routine electrophysiology followup.  She is s/p AF ablation 12/12/2022. During ablation, the pulmonary veins, posterior wall, and CTI were ablated.  She last saw Dr. Marven Slimmer 03/2023 at which time her flecainide  was stopped d/t no AF since ablation.   On follow-up today, she is doing very well from a cardiac standpoint. She had a recent knee replacement, followed by bacterial infection in her R hip, and she is now on IV abx. Throughout all of her ortho and infectious challenge the past couple of months, she does not think she has had any episodes of AFib or flutter. She does note that her heart rate would rise, but always in response to uncontrolled pain.   Her IV abx has caused some N/V/D, but seems to be well-managed currently. Her PRN oxy transitioned to tramodol which she tolerates much better from a GI standpoint.    Arrhythmia/Device History Flecainide  - stopped d/t no AF    ROS:  Please see the history of present illness. All other systems are reviewed and otherwise negative.    Physical Exam    VS:  BP 122/78 (BP Location: Right Arm, Patient Position: Sitting, Cuff Size: Normal)   Pulse 67   Ht 5\' 5"  (1.651 m)   Wt 232 lb (105.2 kg)   SpO2 95%   BMI 38.61 kg/m  BMI: Body mass index is 38.61 kg/m.  Wt Readings from Last 3 Encounters:  11/10/23 232 lb (105.2 kg)  11/09/23 232 lb (105.2 kg)  11/03/23  238 lb (108 kg)     GEN- The patient is well appearing, alert and oriented x 3 today.   Lungs- Clear to ausculation bilaterally, normal work of breathing.  Heart- Regular rate and rhythm, no murmurs, rubs or gallops Extremities- Trace peripheral edema, warm, dry    Studies Reviewed   Previous EP, cardiology notes.    EKG is not ordered. Personal review of EKG from  08/25/2023  shows:  SR at 62         TTE, 08/26/2023  1. Left ventricular ejection fraction, by estimation, is 60 to 65%. The left ventricle has normal function. The left ventricle has no regional wall motion abnormalities. Left ventricular diastolic parameters were normal.   2. Right ventricular systolic function is normal. The right ventricular size is normal. There is normal pulmonary artery systolic pressure. The estimated right ventricular systolic pressure is 25.6 mmHg.   3. The mitral valve is normal in structure. Mild mitral valve regurgitation. No evidence of mitral stenosis.   4. The aortic valve is normal in structure. Aortic valve regurgitation is not visualized. No aortic stenosis is present. Aortic valve mean gradient measures 5.0 mmHg.   5. The inferior vena cava is normal in size with greater than 50% respiratory variability, suggesting right atrial pressure of 3 mmHg.     Assessment and Plan     #) persis AFib #) atrial  flutter S/p AF ablation 12/2022 by Dr. Marven Slimmer Previously managed with flecainide , this has since been stopped d/t no AFib No recurrence of afib/flutter that she is aware of  #) Hypercoag d/t persis afib CHA2DS2-VASc Score = at least 4 [CHF History: 1, HTN History: 1, Diabetes History: 0, Stroke History: 0, Vascular Disease History: 0, Age Score: 1, Gender Score: 1].  Therefore, the patient's annual risk of stroke is 4.8 %.    Stroke ppx - 5mg  eliquis  BID, appropriately dosed No bleeding concerns  #) bacteremia #) s/p multiple joint replacement Mgmt per ortho and ID teams       Current medicines are reviewed at length with the patient today.   The patient does not have concerns regarding her medicines.  The following changes were made today:  none  Labs/ tests ordered today include:  No orders of the defined types were placed in this encounter.    Disposition: Follow up with Dr. Marven Slimmer or EP APP  PRN   Continue to follow-up with Dr. Gollan and/or gen cards APPs as recommended   Signed, Kemesha Mosey, NP  11/10/23  12:30 PM  Electrophysiology [facility]

## 2023-11-10 ENCOUNTER — Ambulatory Visit: Attending: Cardiology | Admitting: Cardiology

## 2023-11-10 ENCOUNTER — Encounter: Payer: Self-pay | Admitting: Cardiology

## 2023-11-10 VITALS — BP 122/78 | HR 67 | Ht 65.0 in | Wt 232.0 lb

## 2023-11-10 DIAGNOSIS — I5032 Chronic diastolic (congestive) heart failure: Secondary | ICD-10-CM | POA: Diagnosis not present

## 2023-11-10 DIAGNOSIS — D631 Anemia in chronic kidney disease: Secondary | ICD-10-CM | POA: Diagnosis not present

## 2023-11-10 DIAGNOSIS — E785 Hyperlipidemia, unspecified: Secondary | ICD-10-CM | POA: Diagnosis not present

## 2023-11-10 DIAGNOSIS — I4892 Unspecified atrial flutter: Secondary | ICD-10-CM

## 2023-11-10 DIAGNOSIS — I4819 Other persistent atrial fibrillation: Secondary | ICD-10-CM

## 2023-11-10 DIAGNOSIS — D6869 Other thrombophilia: Secondary | ICD-10-CM | POA: Diagnosis not present

## 2023-11-10 DIAGNOSIS — N182 Chronic kidney disease, stage 2 (mild): Secondary | ICD-10-CM | POA: Diagnosis not present

## 2023-11-10 DIAGNOSIS — I130 Hypertensive heart and chronic kidney disease with heart failure and stage 1 through stage 4 chronic kidney disease, or unspecified chronic kidney disease: Secondary | ICD-10-CM | POA: Diagnosis not present

## 2023-11-10 DIAGNOSIS — T8452XA Infection and inflammatory reaction due to internal left hip prosthesis, initial encounter: Secondary | ICD-10-CM | POA: Diagnosis not present

## 2023-11-10 DIAGNOSIS — I480 Paroxysmal atrial fibrillation: Secondary | ICD-10-CM | POA: Diagnosis not present

## 2023-11-10 NOTE — Patient Instructions (Signed)
 Medication Instructions:  The current medical regimen is effective;  continue present plan and medications as directed. Please refer to the Current Medication list given to you today.    *If you need a refill on your cardiac medications before your next appointment, please call your pharmacy*  Follow-Up: At [facility], you and your health needs are our priority.  As part of our continuing mission to provide you with exceptional heart care, our providers are all part of one team.  This team includes your primary Cardiologist (physician) and Advanced Practice Providers or APPs (Physician Assistants and Nurse Practitioners) who all work together to provide you with the care you need, when you need it.  Your next appointment:   Please schedule with Dr.Gollan from recall   AS NEEDED WITH SUZANN RIDDLE, NP  We recommend signing up for the patient portal called "MyChart".  Sign up information is provided on this After Visit Summary.  MyChart is used to connect with patients for Virtual Visits (Telemedicine).  Patients are able to view lab/test results, encounter notes, upcoming appointments, etc.  Non-urgent messages can be sent to your provider as well.   To learn more about what you can do with MyChart, go to [email or web address].

## 2023-11-11 ENCOUNTER — Other Ambulatory Visit: Payer: Self-pay | Admitting: Family

## 2023-11-11 DIAGNOSIS — F419 Anxiety disorder, unspecified: Secondary | ICD-10-CM

## 2023-11-12 DIAGNOSIS — T8452XA Infection and inflammatory reaction due to internal left hip prosthesis, initial encounter: Secondary | ICD-10-CM | POA: Diagnosis not present

## 2023-11-13 DIAGNOSIS — G4700 Insomnia, unspecified: Secondary | ICD-10-CM | POA: Diagnosis not present

## 2023-11-13 DIAGNOSIS — G2581 Restless legs syndrome: Secondary | ICD-10-CM | POA: Diagnosis not present

## 2023-11-13 DIAGNOSIS — E039 Hypothyroidism, unspecified: Secondary | ICD-10-CM | POA: Diagnosis not present

## 2023-11-13 DIAGNOSIS — E785 Hyperlipidemia, unspecified: Secondary | ICD-10-CM | POA: Diagnosis not present

## 2023-11-13 DIAGNOSIS — I2510 Atherosclerotic heart disease of native coronary artery without angina pectoris: Secondary | ICD-10-CM | POA: Diagnosis not present

## 2023-11-13 DIAGNOSIS — D631 Anemia in chronic kidney disease: Secondary | ICD-10-CM | POA: Diagnosis not present

## 2023-11-13 DIAGNOSIS — E559 Vitamin D deficiency, unspecified: Secondary | ICD-10-CM | POA: Diagnosis not present

## 2023-11-13 DIAGNOSIS — G43909 Migraine, unspecified, not intractable, without status migrainosus: Secondary | ICD-10-CM | POA: Diagnosis not present

## 2023-11-13 DIAGNOSIS — I700 Atherosclerosis of aorta: Secondary | ICD-10-CM | POA: Diagnosis not present

## 2023-11-13 DIAGNOSIS — I480 Paroxysmal atrial fibrillation: Secondary | ICD-10-CM | POA: Diagnosis not present

## 2023-11-13 DIAGNOSIS — N182 Chronic kidney disease, stage 2 (mild): Secondary | ICD-10-CM | POA: Diagnosis not present

## 2023-11-13 DIAGNOSIS — D62 Acute posthemorrhagic anemia: Secondary | ICD-10-CM | POA: Diagnosis not present

## 2023-11-13 DIAGNOSIS — I130 Hypertensive heart and chronic kidney disease with heart failure and stage 1 through stage 4 chronic kidney disease, or unspecified chronic kidney disease: Secondary | ICD-10-CM | POA: Diagnosis not present

## 2023-11-13 DIAGNOSIS — M150 Primary generalized (osteo)arthritis: Secondary | ICD-10-CM | POA: Diagnosis not present

## 2023-11-13 DIAGNOSIS — I4892 Unspecified atrial flutter: Secondary | ICD-10-CM | POA: Diagnosis not present

## 2023-11-13 DIAGNOSIS — M5116 Intervertebral disc disorders with radiculopathy, lumbar region: Secondary | ICD-10-CM | POA: Diagnosis not present

## 2023-11-13 DIAGNOSIS — I5032 Chronic diastolic (congestive) heart failure: Secondary | ICD-10-CM | POA: Diagnosis not present

## 2023-11-13 DIAGNOSIS — K219 Gastro-esophageal reflux disease without esophagitis: Secondary | ICD-10-CM | POA: Diagnosis not present

## 2023-11-13 DIAGNOSIS — M48061 Spinal stenosis, lumbar region without neurogenic claudication: Secondary | ICD-10-CM | POA: Diagnosis not present

## 2023-11-13 DIAGNOSIS — T8452XA Infection and inflammatory reaction due to internal left hip prosthesis, initial encounter: Secondary | ICD-10-CM | POA: Diagnosis not present

## 2023-11-13 DIAGNOSIS — J449 Chronic obstructive pulmonary disease, unspecified: Secondary | ICD-10-CM | POA: Diagnosis not present

## 2023-11-13 DIAGNOSIS — M4726 Other spondylosis with radiculopathy, lumbar region: Secondary | ICD-10-CM | POA: Diagnosis not present

## 2023-11-14 ENCOUNTER — Ambulatory Visit: Attending: Infectious Diseases | Admitting: Infectious Diseases

## 2023-11-14 ENCOUNTER — Encounter: Payer: Self-pay | Admitting: Infectious Diseases

## 2023-11-14 VITALS — BP 140/82 | HR 59 | Temp 98.0°F

## 2023-11-14 DIAGNOSIS — T8450XD Infection and inflammatory reaction due to unspecified internal joint prosthesis, subsequent encounter: Secondary | ICD-10-CM | POA: Insufficient documentation

## 2023-11-14 DIAGNOSIS — G2581 Restless legs syndrome: Secondary | ICD-10-CM | POA: Diagnosis not present

## 2023-11-14 DIAGNOSIS — N182 Chronic kidney disease, stage 2 (mild): Secondary | ICD-10-CM | POA: Diagnosis not present

## 2023-11-14 DIAGNOSIS — I480 Paroxysmal atrial fibrillation: Secondary | ICD-10-CM | POA: Insufficient documentation

## 2023-11-14 DIAGNOSIS — T8452XD Infection and inflammatory reaction due to internal left hip prosthesis, subsequent encounter: Secondary | ICD-10-CM

## 2023-11-14 DIAGNOSIS — F419 Anxiety disorder, unspecified: Secondary | ICD-10-CM | POA: Diagnosis not present

## 2023-11-14 DIAGNOSIS — T8452XA Infection and inflammatory reaction due to internal left hip prosthesis, initial encounter: Secondary | ICD-10-CM | POA: Diagnosis not present

## 2023-11-14 DIAGNOSIS — Y831 Surgical operation with implant of artificial internal device as the cause of abnormal reaction of the patient, or of later complication, without mention of misadventure at the time of the procedure: Secondary | ICD-10-CM | POA: Insufficient documentation

## 2023-11-14 DIAGNOSIS — Z96643 Presence of artificial hip joint, bilateral: Secondary | ICD-10-CM | POA: Insufficient documentation

## 2023-11-14 DIAGNOSIS — I130 Hypertensive heart and chronic kidney disease with heart failure and stage 1 through stage 4 chronic kidney disease, or unspecified chronic kidney disease: Secondary | ICD-10-CM | POA: Insufficient documentation

## 2023-11-14 DIAGNOSIS — Z96653 Presence of artificial knee joint, bilateral: Secondary | ICD-10-CM | POA: Insufficient documentation

## 2023-11-14 DIAGNOSIS — Z7901 Long term (current) use of anticoagulants: Secondary | ICD-10-CM | POA: Insufficient documentation

## 2023-11-14 DIAGNOSIS — Z79899 Other long term (current) drug therapy: Secondary | ICD-10-CM | POA: Diagnosis not present

## 2023-11-14 DIAGNOSIS — F32A Depression, unspecified: Secondary | ICD-10-CM | POA: Insufficient documentation

## 2023-11-14 MED ORDER — SULFAMETHOXAZOLE-TRIMETHOPRIM 800-160 MG PO TABS: 1.0000 | ORAL_TABLET | Freq: Two times a day (BID) | ORAL | 1 refills | Status: DC

## 2023-11-14 NOTE — Patient Instructions (Signed)
 Today, we reviewed your ongoing treatment for the left hip prosthetic joint infection. You are currently on IV daptomycin  and have resumed physical therapy. We also discussed your other health conditions, including atrial fibrillation, hypertension, and restless legs syndrome, and confirmed that your current medications are appropriate.  YOUR PLAN:  -PROSTHETIC JOINT INFECTION, LEFT HIP: Your left hip prosthetic joint infection is being treated with IV daptomycin , and the surgical site is healing well with minimal drainage. You will continue this antibiotic until Nov 27, 2023, and then switch to oral Bactrim. We will monitor your kidney function and potassium levels during this time. We will also discuss with Dr. Clyda Dark the possibility of extending your oral antibiotic therapy beyond three months due to the remaining hardware. Continue your physical therapy twice a week and gradually increase your activity level.  -BILATERAL HIP REPLACEMENT: You have had both hips replaced, with the current infection in the left hip prosthesis. We are focusing on treating this infection and ensuring proper healing.  -ATRIAL FIBRILLATION: Atrial fibrillation is a condition where your heart beats irregularly. You are managing this with Eliquis , Bisporolol- you had cardiac ablation   -HYPERTENSION: Hyou say you take lisinopril/hydrochlorothiazide  as needed only  -RESTLESS LEGS SYNDROME: Restless legs syndrome causes an uncontrollable urge to move your legs. You are managing this with Requip , and you should continue taking it as prescribed.  -KNEE REPLACEMENT: You have had a left knee replacement and currently have no issues related to it.  INSTRUCTIONS:  Please continue your IV daptomycin  therapy until Nov 27, 2023, and then switch to oral Bactrim as discussed. Monitor your kidney function and potassium levels during this time. Continue physical therapy twice a week and gradually increase your activity level. Follow  up with Dr. Clyda Dark follow up with me in July

## 2023-11-14 NOTE — Progress Notes (Signed)
 NAME: Amber Barnes  DOB: 01-11-1951  MRN: [id number]  Date/Time: 11/14/2023 12:28 PM   Subjective:   Pt consented to the use of AI scribe- she is here with Daughter in law ? Amber Barnes is a 73 y.o. with a history of rt Ca breast, b/l TKA. Left THA, afib , cardiac ablation and  a recent left hip prosthetic joint infection  presents for follow-up . She is on  IV daptomycin  for Staphylococcus lugdunensis. On April 8th,2025 she had DAIR - wash out, the liner and head were changed, and antibiotic beads were placed. She is currently on IV daptomycin  administered through a port once daily between 9 and 10 AM using the SASH method (saline, antibiotic, saline, heparin ).  There is minimal drainage from the surgical site, with only a small dot of drainage noted when staples were removed last week. She resumed physical therapy this week, attending sessions twice a week, and reports increased soreness likely due to the exercises. She is able to get on the bed now, which she couldn't do before, and walks with a walker but feels weak and unsafe without it.  She is taking tramadol  every six hours for pain management after discontinuing oxycodone  due to severe nausea and vomiting, which led to dehydration and a hospital visit on April 25th. Her pain is improving, as evidenced by her ability to delay taking tramadol  by two hours last night. She is also on a stool softener to manage constipation from pain medication.  Her past medical history includes bilateral hip replacements, with the most recent hip surgery in October, and a knee replacement on March 3rd. She has a history of atrial fibrillation for which she takes Eliquis . She also takes vitamin D3, Requip  for restless leg syndrome, Xanax  as needed, Buspar , Lexapro , bisoprolol , and Hyzaar as needed for blood pressure spikes.  No itching, cough, difficulty breathing, or diarrhea related to her current antibiotic treatment. No issues with her port site,  which she cleans with an alcohol wipe after each use.  Past Medical History:  Diagnosis Date   Achilles tendinitis    Acute medial meniscal tear    Allergy    Anemia    Anxiety    a.) on BZO PRN (alprazolam )   Aortic atherosclerosis (HCC)    Ascending aorta dilatation (HCC) 10/20/2022   a.) TTE 10/20/2022: asc Ao 39 mm   Atrial fibrillation/flutter (HCC) 07/2016   a.) Dx'd 07/2016; b.) CHA2DS2-VASc = 5 (age, sex, HFmrEF, HTN, vasc disease history) as of 05/09/2023; c.) s/p DCCV 09/07/2016 (150 J x 2 --> A.flutter; 200 J x 1 --> NSR); d.) s/p DCCV 10/19/2016 (150 J x 1 --> NSR); e.) brought in for DCCV 12/2016; cancelled d/t NSR; f.) s/p PVI ablation 12/12/2022; g.) cardiac rate/rhythm maintained on oral bisoprolol ; chronically anticoagulated using apixaban    BMI 50.0-59.9, adult (HCC) 07/16/2018   CAD (coronary artery disease)    a.) cCTA 12/08/2022: Ca2+ = 31.6 (58th %'ile for age/sex/race match control)   Cardiomegaly    Chemotherapy induced nausea and vomiting 05/18/2021   Chronic heart failure with mid-range ejection fraction (HFmrEF) (HCC)    a.) TTE 03/08/21: EF 60-65, mild LVH, G1DD, mild LAE, triv MR; b.) TTE 07/29/21: EF 60-65, G1DD, mild LAE, triv MR; c.) TTE 11/26/21: EF 55-60, G1DD, mild LAE, mild MR; d.) TTE 02/25/22: EF 60-65, G1DD, mild LAE, mild MR; e.) TTE 05/31/22: EF 55-60, mild LVH, G2DD, mild-mod MR; f.) TTE 10/20/22: EF 45-50, mild LAE, mod red RVSF, mild-mod TR,  Asc Ao 39; g.) TTE 12/13/22: EF 50-55, mild LVH, mild MAC   CKD (chronic kidney disease), stage II    Colon polyp    Complication of anesthesia    a.) emergence delirium; b.) hypoxia after hip replacement   COPD (chronic obstructive pulmonary disease) (HCC)    DDD (degenerative disc disease), lumbar    a.) s/p fusion   Depression    Endometriosis    Family history of CLL (chronic lymphoid leukemia) 03/21/2020   Fibrocystic breast disease    GERD (gastroesophageal reflux disease)    History of kidney stones     History of stress test    a.) MV 07/29/2016: EF 55-65%. Small, mild defect  in mid anteroseptal, apical septal, and apical locations. No ischemia-->low risk; b.) MV 10/2022: mod sized partially rev apical ant, apical septal, and apical defect. EF 52%. No signific cor Ca2+; c.) cCTA 12/08/2022: Ca2+ = 31.6 (58th %'ile).   History of tobacco use    Hyperlipidemia    Hypertension    Hypothyroidism    a.) s/p partial thyroidectomy for nodule; pathology benign   Insomnia    Left knee pain, unspecified chronicity    Lipoma    Malignant neoplasm of upper-outer quadrant of right female breast (HCC) 03/03/2021   a.) stage 1A invasive mammary carcinoma (cT1b or T1c N0); ER (-), PR (+), HER2/neu (+); did not tolerate adjuvant chemotherapy (paclitacel + trastuzumab ) despite paclitaxel  dose reduction; s/p adjuvant XRT; cinished maintenance trastuzumab  07/27/2022; no adjuvant endocrine therapy as no clear benefit   Migraine    Nephrolithiasis    On apixaban  therapy    OSA (obstructive sleep apnea)    a.) non-compliant with nocturnal PAP therapy   Osteoarthritis    Osteopenia    PAF (paroxysmal atrial fibrillation) (HCC) 12/13/2022   Peptic ulcer disease    a.) H. pylori   Pericarditis 12/12/2022   a.) following A.fib ablation; resolved   Pneumonia    Port-A-Cath in place    Primary localized osteoarthritis of pelvic region and thigh 03/02/2022   RLS (restless legs syndrome)    a.) on ropinirole    S/P TKR (total knee replacement), left 09/11/2023   S/P total left hip arthroplasty 05/11/2023   Vertigo 06/08/2016   Vitamin D  deficiency     Past Surgical History:  Procedure Laterality Date   ABDOMINAL HYSTERECTOMY     ovaries left   ACHILLES TENDON SURGERY     2012,01/2017   ATRIAL FIBRILLATION ABLATION N/A 12/12/2022   Procedure: ATRIAL FIBRILLATION ABLATION;  Surgeon: Boyce Byes, MD;  Location: [facility];  Service: Cardiovascular;  Laterality: N/A;   BREAST BIOPSY Right  01/15/2021   Affirm biopsy-"X" clip-INVASIVE MAMMARY CARCINOMA   BREAST BIOPSY Right 01/15/2021   u/s bx-"venus" clip INVASIVE MAMMARY CARCINOMA   BREAST BIOPSY     BREAST BIOPSY Right 01/04/2023   u/s bx ribbon clip path pending   BREAST BIOPSY Right 01/04/2023   US  RT BREAST BX W LOC DEV 1ST LESION IMG BX SPEC US  GUIDE 01/04/2023 [facility]   BREAST LUMPECTOMY,RADIO FREQ LOCALIZER,AXILLARY SENTINEL LYMPH NODE BIOPSY Right 02/17/2021   Procedure: BREAST LUMPECTOMY,RADIO FREQ LOCALIZER,AXILLARY SENTINEL LYMPH NODE BIOPSY;  Surgeon: Flynn Hylan, MD;  Location: [facility];  Service: General;  Laterality: Right;   CARDIOVERSION N/A 09/07/2016   Procedure: CARDIOVERSION;  Surgeon: Devorah Fonder, MD;  Location: [facility];  Service: Cardiovascular;  Laterality: N/A;   CARDIOVERSION N/A 10/19/2016   Procedure: CARDIOVERSION;  Surgeon: Devorah Fonder,  MD;  Location: [facility];  Service: Cardiovascular;  Laterality: N/A;   CARDIOVERSION N/A 12/14/2016   Procedure: CARDIOVERSION;  Surgeon: Devorah Fonder, MD;  Location: [facility];  Service: Cardiovascular;  Laterality: N/A;   COLONOSCOPY     COLONOSCOPY WITH PROPOFOL  N/A 05/13/2020   Procedure: COLONOSCOPY WITH PROPOFOL ;  Surgeon: Toledo, Alphonsus Jeans, MD;  Location: [facility];  Service: Gastroenterology;  Laterality: N/A;  ELIQUIS    CYSTOSCOPY WITH STENT PLACEMENT Bilateral 07/12/2021   Procedure: CYSTOSCOPY WITH STENT PLACEMENT;  Surgeon: Lawerence Pressman, MD;  Location: [facility];  Service: Urology;  Laterality: Bilateral;   CYSTOSCOPY/URETEROSCOPY/HOLMIUM LASER/STENT PLACEMENT Bilateral 07/30/2021   Procedure: CYSTOSCOPY/URETEROSCOPY/HOLMIUM LASER/BILATERAL STENT REMOVAL;  Surgeon: Lawerence Pressman, MD;  Location: [facility];  Service: Urology;  Laterality: Bilateral;   ESOPHAGOGASTRODUODENOSCOPY     ESOPHAGOGASTRODUODENOSCOPY (EGD) WITH PROPOFOL  N/A 05/13/2020   Procedure: ESOPHAGOGASTRODUODENOSCOPY (EGD) WITH PROPOFOL ;  Surgeon:  Toledo, Alphonsus Jeans, MD;  Location: [facility];  Service: Gastroenterology;  Laterality: N/A;   EXCISIONAL TOTAL HIP ARTHROPLASTY WITH ANTIBIOTIC SPACERS Left 10/17/2023   Procedure: EXCISIONAL TOTAL HIP ARTHROPLASTY WITH PLACEMENT OF ANTIBIOTIC BEADS AND DRAIN;  Surgeon: Venus Ginsberg, MD;  Location: [facility];  Service: Orthopedics;  Laterality: Left;   FOOT SURGERY Left    tendon   LUMBAR FUSION     PORTA CATH INSERTION N/A 03/23/2021   Procedure: PORTA CATH INSERTION;  Surgeon: Jackquelyn Mass, MD;  Location: [facility];  Service: Cardiovascular;  Laterality: N/A;   PORTACATH PLACEMENT N/A 03/23/2021   Procedure: ATTEMPTED INSERTION PORT-A-CATH;  Surgeon: Flynn Hylan, MD;  Location: [facility];  Service: General;  Laterality: N/A;   THYROIDECTOMY  1990   half thyroid  lobectomy secondary to a benign nodule   TOTAL HIP ARTHROPLASTY Right 08/21/2018   Procedure: TOTAL HIP ARTHROPLASTY ANTERIOR APPROACH-RIGHT CELL SAVER REQUESTED;  Surgeon: Molli Angelucci, MD;  Location: [facility];  Service: Orthopedics;  Laterality: Right;   TOTAL HIP ARTHROPLASTY Left 05/11/2023   Procedure: TOTAL HIP ARTHROPLASTY ANTERIOR APPROACH;  Surgeon: Venus Ginsberg, MD;  Location: [facility];  Service: Orthopedics;  Laterality: Left;   TOTAL KNEE ARTHROPLASTY Left 09/11/2023   Procedure: ARTHROPLASTY, KNEE, TOTAL;  Surgeon: Venus Ginsberg, MD;  Location: [facility];  Service: Orthopedics;  Laterality: Left;    Social History   Socioeconomic History   Marital status: Married    Spouse name: jeffrey   Number of children: 1   Years of education: Not on file   Highest education level: 12th grade  Occupational History    Comment: disabled  Tobacco Use   Smoking status: Former    Current packs/day: 0.00    Average packs/day: 0.3 packs/day for 10.0 years (2.5 ttl pk-yrs)    Types: Cigarettes    Start date: 46    Quit date: 2005    Years since quitting: 20.3    Passive exposure: Past    Smokeless tobacco: Never   Tobacco comments:    smoked for 10 years, 1 pack every 2-3 days  Vaping Use   Vaping status: Never Used  Substance and Sexual Activity   Alcohol use: Yes    Alcohol/week: 1.0 standard drink of alcohol    Types: 1 Glasses of wine per week    Comment: margarita qd   Drug use: Yes    Types: Marijuana   Sexual activity: Not Currently  Other Topics Concern   Not on file  Social History Narrative   Moved to [place] from [place], originally from [place]  Grandmother.    1 cup of caffeine  daily   Right handed   Lives with husband, step-daughter and child       Social Drivers of Health   Financial Resource Strain: Low Risk  (08/23/2023)   Received [facility]   Overall Financial Resource Strain (CARDIA)    Difficulty of Paying Living Expenses: Not very hard  Food Insecurity: No Food Insecurity (10/23/2023)   Hunger Vital Sign    Worried About Running Out of Food in the Last Year: Never true    Ran Out of Food in the Last Year: Never true  Transportation Needs: No Transportation Needs (10/23/2023)   PRAPARE - Administrator, Civil Service (Medical): No    Lack of Transportation (Non-Medical): No  Physical Activity: Insufficiently Active (07/14/2023)   Exercise Vital Sign    Days of Exercise per Week: 4 days    Minutes of Exercise per Session: 20 min  Stress: Stress Concern Present (07/14/2023)   [employer] of Occupational Health - Occupational Stress Questionnaire    Feeling of Stress : To some extent  Social Connections: Socially Integrated (10/16/2023)   Social Connection and Isolation Panel [NHANES]    Frequency of Communication with Friends and Family: More than three times a week    Frequency of Social Gatherings with Friends and Family: More than three times a week    Attends Religious Services: More than 4 times per year    Active Member of [employer] or Organizations: Yes    Attends Hospital doctor: More than 4 times per year    Marital Status: Married  Catering manager Violence: Not At Risk (10/23/2023)   Humiliation, Afraid, Rape, and Kick questionnaire    Fear of Current or Ex-Partner: No    Emotionally Abused: No    Physically Abused: No    Sexually Abused: No    Family History  Problem Relation Age of Onset   Cancer Mother        leukemia   Cancer Father 44       colon   Heart disease Father    Heart attack Father 40       died from MI   Hyperlipidemia Father    Hypertension Father    Cancer Sister 42       ovarian   Depression Sister    Cancer Brother        CLL   Breast cancer Paternal Aunt        40's   Migraines Neg Hx    Thyroid  cancer Neg Hx    Allergies  Allergen Reactions   Hydrocodone     Prednisone  Other (See Comments)    Agitation, hyperactivity, polyphagia   Amiodarone  Nausea And Vomiting and Other (See Comments)   Hydrocodone -Acetaminophen  Other (See Comments)    Hallucinations and combative   Tapentadol Itching   Adhesive [Tape] Itching and Rash    Prefers paper tape   Latex Itching and Rash    use paper tap   Oxycodone  Itching    Can take it with benadryl    Tramadol  Itching    Can take it with benadryl    Wound Dressing Adhesive    I? Current Outpatient Medications  Medication Sig Dispense Refill   acetaminophen  (TYLENOL ) 500 MG tablet Take 2 tablets (1,000 mg total) by mouth every 8 (eight) hours. 30 tablet 0   ALPRAZolam  (XANAX ) 0.25 MG tablet TAKE 1 TABLET BY MOUTH TWICE A DAY AS NEEDED  FOR ANXIETY 60 tablet 2   bisoprolol  (ZEBETA ) 10 MG tablet Take 1 tablet (10 mg total) by mouth 2 (two) times daily. 180 tablet 3   busPIRone  (BUSPAR ) 5 MG tablet TAKE 1 TABLET BY MOUTH THREE TIMES A DAY AS NEEDED 60 tablet 1   Calcium  Carbonate (CALCIUM  500 PO) Take 1 tablet by mouth daily.     Cholecalciferol  (VITAMIN D3) 5000 units CAPS Take 5,000 Units by mouth daily.     daptomycin  (CUBICIN ) IVPB Inject 600 mg into the vein daily.  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 Please pull PIC at completion of IV antibiotics Call [phone] with critical value or questions 37 Units 0   docusate sodium  (COLACE) 100 MG capsule Take 1 capsule (100 mg total) by mouth 2 (two) times daily. 10 capsule 0   ELIQUIS  5 MG TABS tablet TAKE 1 TABLET BY MOUTH TWICE A DAY 180 tablet 1   escitalopram  (LEXAPRO ) 10 MG tablet Take 1 tablet (10 mg total) by mouth daily. 90 tablet 3   famotidine  (PEPCID ) 20 MG tablet Take 1 tablet (20 mg total) by mouth 2 (two) times daily. 60 tablet 0   furosemide  (LASIX ) 20 MG tablet Take 1 tablet (20 mg total) by mouth daily. May take additional 20mg  PO prn for leg swelling 180 tablet 3   losartan -hydrochlorothiazide  (HYZAAR) 50-12.5 MG tablet Take 0.5 tablets by mouth daily as needed (for BP 140/80). 30 tablet 1   metoCLOPramide  (REGLAN ) 10 MG tablet Take 1 tablet (10 mg total) by mouth every 6 (six) hours as needed. 30 tablet 0   Multiple Vitamin (MULTIVITAMIN) tablet Take 1 tablet by mouth daily.     ondansetron  (ZOFRAN ) 4 MG tablet Take 1 tablet (4 mg total) by mouth every 6 (six) hours as needed for nausea. 20 tablet 0   potassium chloride  SA (KLOR-CON  M) 20 MEQ tablet Take 1 tablet (20 mEq total) by mouth daily. (Patient taking differently: Take 20 mEq by mouth every morning. And if she takes additional Lasix  she will take another K+) 90 tablet 3   ranolazine  (RANEXA ) 500 MG 12 hr tablet TAKE 1 TABLET BY MOUTH TWICE A DAY 180 tablet 2   rOPINIRole  (REQUIP ) 2 MG tablet Take 2 mg by mouth  at bedtime.     [Paused] rosuvastatin  (CRESTOR ) 40 MG tablet TAKE 1 TABLET BY MOUTH EVERY DAY 90 tablet 3   traMADol  (ULTRAM ) 50 MG tablet Take 1-2 tablets (50-100 mg total) by mouth every 6 (six) hours as needed for moderate pain (pain score 4-6). 30 tablet 0   No current facility-administered medications for this visit.     Abtx:  Anti-infectives (From admission, onward)    None       REVIEW OF SYSTEMS:  Const: negative fever, negative chills, negative weight loss Eyes: negative diplopia or visual changes, negative eye pain ENT: negative coryza, negative sore throat Resp: negative cough, hemoptysis, dyspnea Cards: negative for chest pain, palpitations, lower extremity edema GU: negative for frequency, dysuria and hematuria GI: Negative for abdominal pain, diarrhea, bleeding, constipation Skin: negative for rash and pruritus Heme: negative for easy bruising  and gum/nose bleeding MS: left inner thigh pain Neurolo:negative for headaches, dizziness, vertigo, memory problems  Psych: negative for feelings of anxiety, depression  Endocrine: negative for thyroid , diabetes Allergy/Immunology- multiple allergies as listed above Objective:  VITALS:  BP (!) 140/82   Pulse (!) 59   Temp 98 F (36.7 C) (Temporal)   SpO2 95%  LDA PORT PHYSICAL EXAM:  General: Alert, cooperative, no distress, appears stated age. In wheel chair Head: Normocephalic, without obvious abnormality, atraumatic. Eyes: Conjunctivae clear, anicteric sclerae. Pupils are equal ENT Nares normal. No drainage or sinus tenderness. Lips, mucosa, and tongue normal. No Thrush Neck: Supple, symmetrical, no adenopathy, thyroid : non tender no carotid bruit and no JVD. Back: No CVA tenderness. Lungs: Clear to auscultation bilaterally. No Wheezing or Rhonchi. No rales. Heart: Regular rate and rhythm, no murmur, rub or gallop. Abdomen: Soft, non-tender,not distended. Bowel sounds normal. No masses Extremities: left thigh  steristrips covering the surgical site   LEFT TKA site healthy Skin: No rashes or lesions. Or bruising Lymph: Cervical, supraclavicular normal. Neurologic: Grossly non-focal Pertinent Labs Lab Results CBC    Component Value Date/Time   WBC 6.2 11/03/2023 0905   RBC 3.85 (L) 11/03/2023 0905   HGB 11.7 (L) 11/03/2023 0905   HGB 13.2 08/17/2023 1037   HGB 12.9 10/11/2016 0843   HCT 34.5 (L) 11/03/2023 0905   HCT 37.7 10/11/2016 0843   PLT 205 11/03/2023 0905   PLT 152 08/17/2023 1037   PLT 227 10/11/2016 0843   MCV 89.6 11/03/2023 0905   MCV 86 10/11/2016 0843   MCH 30.4 11/03/2023 0905   MCHC 33.9 11/03/2023 0905   RDW 14.1 11/03/2023 0905   RDW 14.8 10/11/2016 0843   LYMPHSABS 0.8 11/03/2023 0905   LYMPHSABS 1.6 10/11/2016 0843   MONOABS 0.4 11/03/2023 0905   EOSABS 0.0 11/03/2023 0905   EOSABS 0.1 10/11/2016 0843   BASOSABS 0.0 11/03/2023 0905   BASOSABS 0.0 10/11/2016 0843       Latest Ref Rng & Units 11/03/2023    9:05 AM 10/22/2023    4:10 AM 10/20/2023    5:00 AM  CMP  Glucose 70 - 99 mg/dL 161  88    BUN 8 - 23 mg/dL 15  19    Creatinine 0.96 - 1.00 mg/dL 0.45  4.09  8.11   Sodium 135 - 145 mmol/L 135  137    Potassium 3.5 - 5.1 mmol/L 3.5  4.0    Chloride 98 - 111 mmol/L 101  99    CO2 22 - 32 mmol/L 26  29    Calcium  8.9 - 10.3 mg/dL 8.7  8.7    Total Protein 6.5 - 8.1 g/dL 7.0     Total Bilirubin 0.0 - 1.2 mg/dL 0.9     Alkaline Phos 38 - 126 U/L 74     AST 15 - 41 U/L 22     ALT 0 - 44 U/L 15      Labs from 4/29 - CK 34, creatinine 0.84, hemoglobin 10, WBC 3.3 , eosinophils 4%   ? 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 She underwent surgical debridement and antibiotic bead placement on October 17, 2023. Currently on daptomycin  IV therapy, the surgical site shows minimal drainage and is healing well. Some pain is present, likely from resuming  physical therapy. White blood cell count is slightly low at 3.3, but hemoglobin and platelet counts are stable. Kidney  function is normal. Look out for labs done today Plan to continue daptomycin  IV therapy until Nov 27, 2023, then transition to oral Bactrim with monitoring of kidney function and potassium levels and cbc. Will Discuss with Dr. Clyda Dark the potential for extending oral antibiotic therapy beyond three months because  of hardware. Monitor white blood cell count with today's lab results. Continue physical therapy twice a week, gradually increasing activity.  Bilateral hip replacement   Bilateral hip replacement with current infection in the left hip prosthesis.  Atrial fibrillation   Atrial fibrillation is managed with Eliquis . Continue Eliquis  as prescribed.  Hypertension   She takes PRN losartan /hydrochlorothiazide  as needed for elevated blood pressure episodes. Watch for K/Cr when she goes on bactrim  Restless legs syndrome   Restless legs syndrome is managed with Requip . Continue Requip  as prescribed.  Knee replacement   Left Knee replacement with no current issues . Discussed the management in detail with her and her DIL  Follow up 2 months ? ?Note:  This document was prepared using Dragon voice recognition software and may include unintentional dictation errors.

## 2023-11-15 ENCOUNTER — Telehealth: Payer: Self-pay

## 2023-11-15 LAB — LAB REPORT - SCANNED: EGFR: 72

## 2023-11-15 NOTE — Telephone Encounter (Signed)
 Copied from CRM [id number]. Topic: Clinical - Prescription Issue >> Nov 15, 2023 11:31 AM Luane Rumps D wrote: Reason for CRM: Patient calling because her pharmacy has requested the refill for ALPRAZolam  (XANAX ) 0.25 MG tablet a few times but haven't heard anything back, first request as of 5/3

## 2023-11-15 NOTE — Telephone Encounter (Signed)
 Xanax refilled.

## 2023-11-16 ENCOUNTER — Other Ambulatory Visit: Payer: Self-pay

## 2023-11-16 DIAGNOSIS — I130 Hypertensive heart and chronic kidney disease with heart failure and stage 1 through stage 4 chronic kidney disease, or unspecified chronic kidney disease: Secondary | ICD-10-CM | POA: Diagnosis not present

## 2023-11-16 DIAGNOSIS — E559 Vitamin D deficiency, unspecified: Secondary | ICD-10-CM | POA: Diagnosis not present

## 2023-11-16 DIAGNOSIS — G4700 Insomnia, unspecified: Secondary | ICD-10-CM | POA: Diagnosis not present

## 2023-11-16 DIAGNOSIS — G2581 Restless legs syndrome: Secondary | ICD-10-CM | POA: Diagnosis not present

## 2023-11-16 DIAGNOSIS — I2510 Atherosclerotic heart disease of native coronary artery without angina pectoris: Secondary | ICD-10-CM | POA: Diagnosis not present

## 2023-11-16 DIAGNOSIS — G43909 Migraine, unspecified, not intractable, without status migrainosus: Secondary | ICD-10-CM | POA: Diagnosis not present

## 2023-11-16 DIAGNOSIS — I480 Paroxysmal atrial fibrillation: Secondary | ICD-10-CM | POA: Diagnosis not present

## 2023-11-16 DIAGNOSIS — N182 Chronic kidney disease, stage 2 (mild): Secondary | ICD-10-CM | POA: Diagnosis not present

## 2023-11-16 DIAGNOSIS — D631 Anemia in chronic kidney disease: Secondary | ICD-10-CM | POA: Diagnosis not present

## 2023-11-16 DIAGNOSIS — E039 Hypothyroidism, unspecified: Secondary | ICD-10-CM | POA: Diagnosis not present

## 2023-11-16 DIAGNOSIS — K219 Gastro-esophageal reflux disease without esophagitis: Secondary | ICD-10-CM | POA: Diagnosis not present

## 2023-11-16 DIAGNOSIS — T8452XA Infection and inflammatory reaction due to internal left hip prosthesis, initial encounter: Secondary | ICD-10-CM | POA: Diagnosis not present

## 2023-11-16 DIAGNOSIS — M150 Primary generalized (osteo)arthritis: Secondary | ICD-10-CM | POA: Diagnosis not present

## 2023-11-16 DIAGNOSIS — E785 Hyperlipidemia, unspecified: Secondary | ICD-10-CM | POA: Diagnosis not present

## 2023-11-16 DIAGNOSIS — M48061 Spinal stenosis, lumbar region without neurogenic claudication: Secondary | ICD-10-CM | POA: Diagnosis not present

## 2023-11-16 DIAGNOSIS — I4892 Unspecified atrial flutter: Secondary | ICD-10-CM | POA: Diagnosis not present

## 2023-11-16 DIAGNOSIS — I700 Atherosclerosis of aorta: Secondary | ICD-10-CM | POA: Diagnosis not present

## 2023-11-16 DIAGNOSIS — D62 Acute posthemorrhagic anemia: Secondary | ICD-10-CM | POA: Diagnosis not present

## 2023-11-16 DIAGNOSIS — M5116 Intervertebral disc disorders with radiculopathy, lumbar region: Secondary | ICD-10-CM | POA: Diagnosis not present

## 2023-11-16 DIAGNOSIS — I5032 Chronic diastolic (congestive) heart failure: Secondary | ICD-10-CM | POA: Diagnosis not present

## 2023-11-16 DIAGNOSIS — J449 Chronic obstructive pulmonary disease, unspecified: Secondary | ICD-10-CM | POA: Diagnosis not present

## 2023-11-16 DIAGNOSIS — M4726 Other spondylosis with radiculopathy, lumbar region: Secondary | ICD-10-CM | POA: Diagnosis not present

## 2023-11-16 NOTE — Transitions of Care (Post Inpatient/ED Visit) (Signed)
 Transition of Care week 4  Visit Note  11/16/2023  Name: Amber Barnes MRN: [id number]          DOB: 02/10/1951  Situation: Patient enrolled in [facility] 30-day program. Visit completed with Anton Kirsten by telephone.   Background:     Past Medical History:  Diagnosis Date   Achilles tendinitis    Acute medial meniscal tear    Allergy    Anemia    Anxiety    a.) on BZO PRN (alprazolam )   Aortic atherosclerosis (HCC)    Ascending aorta dilatation (HCC) 10/20/2022   a.) TTE 10/20/2022: asc Ao 39 mm   Atrial fibrillation/flutter (HCC) 07/2016   a.) Dx'd 07/2016; b.) CHA2DS2-VASc = 5 (age, sex, HFmrEF, HTN, vasc disease history) as of 05/09/2023; c.) s/p DCCV 09/07/2016 (150 J x 2 --> A.flutter; 200 J x 1 --> NSR); d.) s/p DCCV 10/19/2016 (150 J x 1 --> NSR); e.) brought in for DCCV 12/2016; cancelled d/t NSR; f.) s/p PVI ablation 12/12/2022; g.) cardiac rate/rhythm maintained on oral bisoprolol ; chronically anticoagulated using apixaban    BMI 50.0-59.9, adult (HCC) 07/16/2018   CAD (coronary artery disease)    a.) cCTA 12/08/2022: Ca2+ = 31.6 (58th %'ile for age/sex/race match control)   Cardiomegaly    Chemotherapy induced nausea and vomiting 05/18/2021   Chronic heart failure with mid-range ejection fraction (HFmrEF) (HCC)    a.) TTE 03/08/21: EF 60-65, mild LVH, G1DD, mild LAE, triv MR; b.) TTE 07/29/21: EF 60-65, G1DD, mild LAE, triv MR; c.) TTE 11/26/21: EF 55-60, G1DD, mild LAE, mild MR; d.) TTE 02/25/22: EF 60-65, G1DD, mild LAE, mild MR; e.) TTE 05/31/22: EF 55-60, mild LVH, G2DD, mild-mod MR; f.) TTE 10/20/22: EF 45-50, mild LAE, mod red RVSF, mild-mod TR, Asc Ao 39; g.) TTE 12/13/22: EF 50-55, mild LVH, mild MAC   CKD (chronic kidney disease), stage II    Colon polyp    Complication of anesthesia    a.) emergence delirium; b.) hypoxia after hip replacement   COPD (chronic obstructive pulmonary disease) (HCC)    DDD (degenerative disc disease), lumbar    a.) s/p fusion   Depression     Endometriosis    Family history of CLL (chronic lymphoid leukemia) 03/21/2020   Fibrocystic breast disease    GERD (gastroesophageal reflux disease)    History of kidney stones    History of stress test    a.) MV 07/29/2016: EF 55-65%. Small, mild defect  in mid anteroseptal, apical septal, and apical locations. No ischemia-->low risk; b.) MV 10/2022: mod sized partially rev apical ant, apical septal, and apical defect. EF 52%. No signific cor Ca2+; c.) cCTA 12/08/2022: Ca2+ = 31.6 (58th %'ile).   History of tobacco use    Hyperlipidemia    Hypertension    Hypothyroidism    a.) s/p partial thyroidectomy for nodule; pathology benign   Insomnia    Left knee pain, unspecified chronicity    Lipoma    Malignant neoplasm of upper-outer quadrant of right female breast (HCC) 03/03/2021   a.) stage 1A invasive mammary carcinoma (cT1b or T1c N0); ER (-), PR (+), HER2/neu (+); did not tolerate adjuvant chemotherapy (paclitacel + trastuzumab ) despite paclitaxel  dose reduction; s/p adjuvant XRT; cinished maintenance trastuzumab  07/27/2022; no adjuvant endocrine therapy as no clear benefit   Migraine    Nephrolithiasis    On apixaban  therapy    OSA (obstructive sleep apnea)    a.) non-compliant with nocturnal PAP therapy   Osteoarthritis  Osteopenia    PAF (paroxysmal atrial fibrillation) (HCC) 12/13/2022   Peptic ulcer disease    a.) H. pylori   Pericarditis 12/12/2022   a.) following A.fib ablation; resolved   Pneumonia    Port-A-Cath in place    Primary localized osteoarthritis of pelvic region and thigh 03/02/2022   RLS (restless legs syndrome)    a.) on ropinirole    S/P TKR (total knee replacement), left 09/11/2023   S/P total left hip arthroplasty 05/11/2023   Vertigo 06/08/2016   Vitamin D  deficiency     Assessment: TOC Outreach completed to the patient today. The phone outreach was cut a little short because the patient was concerned that there is blood in her PORT. She  administered her ABX this morning, following the SASH method and she states she just noticed blood in the PORT. She also states she is in pain but takes the Tramadol  which helps. She is working with HHPT and has move movement in her leg although she still feels weak and uses her walker. She has a small site on her incision that drains and is being followed by the surgeon.   Patient Reported Symptoms: Cognitive Cognitive Status: Alert and oriented to person, place, and time      Neurological Neurological Review of Symptoms: No symptoms reported    HEENT HEENT Symptoms Reported: No symptoms reported      Cardiovascular Cardiovascular Symptoms Reported: No symptoms reported Does patient have uncontrolled Hypertension?: No Cardiovascular Conditions: Dysrhythmia, Heart failure Cardiovascular Management Strategies: Medication therapy Do You Have a Working Readable Scale?: Yes Weight: 232 lb (105.2 kg) Cardiovascular Self-Management Outcome: 4 (good)  Respiratory Respiratory Symptoms Reported: No symptoms reported    Endocrine Patient reports the following symptoms related to hypoglycemia or hyperglycemia : No symptoms reported Is patient diabetic?: No    Gastrointestinal Gastrointestinal Symptoms Reported: No symptoms reported Gastrointestinal Self-Management Outcome: 4 (good) Nutrition Risk Screen (CP): Reduced oral intake over the last month  Genitourinary Genitourinary Symptoms Reported: No symptoms reported    Integumentary Integumentary Symptoms Reported: Wound, Incision Additional Integumentary Details: PORT; Hip incision with steri strips Skin Conditions: Wound Skin Management Strategies: Adequate rest, Routine screening, Dressing changes Skin Self-Management Outcome: 4 (good) Skin Comment: Incision site has small drainage which is improving  Musculoskeletal Musculoskelatal Symptoms Reviewed: Difficulty walking, Muscle pain, Weakness Additional Musculoskeletal Details: Able to  move leg but feels unsteady Musculoskeletal Conditions: Back pain, Joint pain, Mobility limited Other Musculoskeletal Conditions: Post TKR and THR Musculoskeletal Management Strategies: Exercise, Routine screening, Medical device, Medication therapy Musculoskeletal Self-Management Outcome: 3 (uncertain) Musculoskeletal Comment: The patient complains of pain Falls in the past year?: Yes Number of falls in past year: 1 or less Was there an injury with Fall?: No Fall Risk Category Calculator: 1 Patient Fall Risk Level: Low Fall Risk Patient at Risk for Falls Due to: History of fall(s) Fall risk Follow up: Falls prevention discussed  Psychosocial Psychosocial Symptoms Reported: No symptoms reported         There were no vitals filed for this visit.  Medications Reviewed Today     Reviewed by Claudene Crystal, RN (Case Manager) on 11/16/23 at 1117  Med List Status: <None>   Medication Order Taking? Sig Documenting Provider Last Dose Status Informant  acetaminophen  (TYLENOL ) 500 MG tablet [id number] No Take 2 tablets (1,000 mg total) by mouth every 8 (eight) hours. Coralyn Derry, PA-C Taking Active Self  ALPRAZolam  (XANAX ) 0.25 MG tablet [id number]  TAKE 1 TABLET BY MOUTH TWICE A  DAY AS NEEDED FOR ANXIETY Calista Catching, FNP  Active   bisoprolol  (ZEBETA ) 10 MG tablet [id number] No Take 1 tablet (10 mg total) by mouth 2 (two) times daily. Gollan, Timothy J, MD Taking Active Self, Pharmacy Records  busPIRone  (BUSPAR ) 5 MG tablet [id number] No TAKE 1 TABLET BY MOUTH THREE TIMES A DAY AS NEEDED Calista Catching, FNP Taking Active Self  Calcium  Carbonate (CALCIUM  500 PO) [id number] No Take 1 tablet by mouth daily. [provider] Taking Active Self  Cholecalciferol  (VITAMIN D3) 5000 units CAPS [id number] No Take 5,000 Units by mouth daily. [provider] Taking Active Self, Pharmacy Records  daptomycin  (CUBICIN ) IVPB [id number] No Inject 600 mg into the vein daily.  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 Please pull PIC at completion of IV antibiotics Call [phone] with critical value or questions Unk Garb, DO Taking Active   docusate sodium  (COLACE) 100 MG capsule [id number] No Take 1 capsule (100 mg total) by mouth 2 (two) times daily. Coralyn Derry, PA-C Taking Active Self  ELIQUIS  5 MG TABS tablet [id number] No TAKE 1 TABLET BY MOUTH TWICE A DAY Gollan, Timothy J, MD Taking Active Self, Pharmacy Records  escitalopram  (LEXAPRO ) 10 MG tablet [id number] No Take 1 tablet (10 mg total) by mouth daily. Calista Catching, FNP Taking Active Self, Pharmacy Records  famotidine  (PEPCID ) 20 MG tablet [id number] No Take 1 tablet (20 mg total) by mouth 2 (two) times daily. Jacquie Maudlin, MD Taking Active   furosemide  (LASIX ) 20 MG tablet [id number] No Take 1 tablet (20 mg total) by mouth daily. May take additional 20mg  PO prn for leg swelling Calista Catching, FNP Taking Active Self, Pharmacy Records  losartan -hydrochlorothiazide  (HYZAAR) 50-12.5 MG tablet [id number] No Take 0.5 tablets by mouth daily as needed (for BP 140/80). Calista Catching, FNP Taking Active Self, Pharmacy Records           Med Note Blondie Burke   Fri Aug 25, 2023  5:53 PM) prn  metoCLOPramide  (REGLAN ) 10 MG tablet [id number] No Take 1 tablet (10 mg total) by mouth every 6 (six) hours as needed. Jacquie Maudlin, MD Taking Active   Multiple Vitamin (MULTIVITAMIN) tablet [id number] No Take 1 tablet by mouth daily. [provider] Taking Active Self,  Pharmacy Records  ondansetron  (ZOFRAN ) 4 MG tablet [id number] No Take 1 tablet (4 mg total) by mouth every 6 (six) hours as needed for nausea. Coralyn Derry, PA-C Taking Active Self  potassium chloride  SA (KLOR-CON  M) 20 MEQ tablet [id number] No Take 1 tablet (20 mEq total) by mouth daily.  Patient taking differently: Take 20 mEq by mouth every morning. And if she takes additional Lasix  she will take another K+   Gollan, Timothy J, MD Taking Active Self, Pharmacy Records  Discontinued 03/02/22 1309 ranolazine  (RANEXA ) 500 MG 12 hr tablet [id number] No TAKE 1 TABLET BY MOUTH TWICE A DAY Gollan, Timothy J, MD Taking Active Self, Pharmacy Records  rOPINIRole  (REQUIP ) 2 MG tablet [id number] No Take 2 mg by mouth at bedtime. [provider] Taking Active Self, Pharmacy Records  rosuvastatin  (CRESTOR ) 40  MG tablet [id number] No TAKE 1 TABLET BY MOUTH EVERY DAY Arnett, Hanley Lew, FNP Taking Active Self, Pharmacy Records  sulfamethoxazole-trimethoprim (BACTRIM DS) 800-160 MG tablet [id number]  Take 1 tablet by mouth 2 (two) times daily. Alica Inks, MD  Active   traMADol  (ULTRAM ) 50 MG tablet [id number] No Take 1-2 tablets (50-100 mg total) by mouth every 6 (six) hours as needed for moderate pain (pain score 4-6). Coralyn Derry, PA-C Taking Active   Med List Note Blondie Burke, CPhT 04/06/21 0113): UDS 02/27/2018             Recommendation:   Follow up with I&D for blood in PORT. Continue to work with HHPT for strength and movement to left leg Assess incision to left hip daily. Notify provider for increased drainage Stay on top of the pain. Medicate with Tramadol  every 4-6 hours as needed Increase protein in your diet  Follow Up Plan:   Telephone follow-up in 1 week  Gareld June, BSN, RN [place]  VBCI - [facility] RN Care Manager [phone]

## 2023-11-16 NOTE — Patient Instructions (Signed)
 Visit Information  Thank you for taking time to visit with me today. Please don't hesitate to contact me if I can be of assistance to you before our next scheduled telephone appointment.  Our next appointment is by telephone on Thursday May 15th at 11:00am  Following is a copy of your care plan:   Goals Addressed             This Visit's Progress    VBCI Transitions of Care (TOC) Care Plan       Problems: (reviewed 11/16/23) Recent Hospitalization for treatment of Infection Equipment/DME barrier Equipment for Wound Vac Wound vac discontinued 11/01/23 PORT for antibiotics  Goal: (reviewed 11/16/23) Goal: On track Over the next 30 days, the patient will not experience hospital readmission Surgery (THR and post op infection 10/16/23): Evaluation of current treatment plan related to Post Op infection for [facility] surgery assessed patient/caregiver understanding of surgical procedure   reviewed post-operative instructions with patient/caregiver addressed questions about post - surgical incision care  reviewed medications with patient and addressed questions reviewed scheduled provider appointments with patientAppointment 10/27/23 with Dr. Clyda Dark - Completed Leave dressing in place in place for one week and assess for drainage. Call Dr, Noberto Bastos after one week to update - Due 5/9 Self administration of IV abx through PORT daily Exercises as instructed by Orthopedic surgeon. Do not put weight on left leg - The patient is able to stand and walk short distances with PT (11/16/23) Assess Pain level daily. Report pain that is greater than a level 8 to the provider. Medicated with Tramadol  and Tylenol  as directed  Patient Self Care Activities:  (reviewed 11/16/23) Attend all scheduled provider appointments Call pharmacy for medication refills 3-7 days in advance of running out of medications Call provider office for new concerns or questions  Notify RN Care Manager of [facility] call rescheduling  needs Participate in Transition of Care Program/Attend [facility] scheduled calls Perform all self care activities independently  Take medications as prescribed    Plan: (reviewed 11/16/23) Telephone follow up appointment with care management team member scheduled for:  Thursday May 15 at 11:00pm         Patient verbalizes understanding of instructions and care plan provided today and agrees to view in MyChart. Active MyChart status and patient understanding of how to access instructions and care plan via MyChart confirmed with patient.     The patient has been provided with contact information for the care management team and has been advised to call with any health related questions or concerns.   Please call the care guide team at [phone] if you need to cancel or reschedule your appointment.   Please call the Suicide and Crisis Lifeline: 988 call the [place]  National Suicide Prevention Lifeline: 901-604-5566 or TTY: 737-249-5915 TTY 2065513090) to talk to a trained counselor if you are experiencing a Mental Health or Behavioral Health Crisis or need someone to talk to.  Gareld June, BSN, RN   VBCI - [employer] [facility] RN Care Manager [phone]

## 2023-11-17 ENCOUNTER — Other Ambulatory Visit: Payer: Self-pay | Admitting: Cardiovascular Disease

## 2023-11-17 ENCOUNTER — Other Ambulatory Visit: Payer: Self-pay | Admitting: Family

## 2023-11-17 DIAGNOSIS — G2581 Restless legs syndrome: Secondary | ICD-10-CM

## 2023-11-18 ENCOUNTER — Other Ambulatory Visit: Payer: Self-pay | Admitting: Family

## 2023-11-18 DIAGNOSIS — F419 Anxiety disorder, unspecified: Secondary | ICD-10-CM

## 2023-11-19 DIAGNOSIS — T8452XA Infection and inflammatory reaction due to internal left hip prosthesis, initial encounter: Secondary | ICD-10-CM | POA: Diagnosis not present

## 2023-11-20 ENCOUNTER — Encounter: Payer: Self-pay | Admitting: Family

## 2023-11-20 DIAGNOSIS — K219 Gastro-esophageal reflux disease without esophagitis: Secondary | ICD-10-CM | POA: Diagnosis not present

## 2023-11-20 DIAGNOSIS — D631 Anemia in chronic kidney disease: Secondary | ICD-10-CM | POA: Diagnosis not present

## 2023-11-20 DIAGNOSIS — M48061 Spinal stenosis, lumbar region without neurogenic claudication: Secondary | ICD-10-CM | POA: Diagnosis not present

## 2023-11-20 DIAGNOSIS — I2510 Atherosclerotic heart disease of native coronary artery without angina pectoris: Secondary | ICD-10-CM | POA: Diagnosis not present

## 2023-11-20 DIAGNOSIS — G4700 Insomnia, unspecified: Secondary | ICD-10-CM | POA: Diagnosis not present

## 2023-11-20 DIAGNOSIS — I480 Paroxysmal atrial fibrillation: Secondary | ICD-10-CM | POA: Diagnosis not present

## 2023-11-20 DIAGNOSIS — E039 Hypothyroidism, unspecified: Secondary | ICD-10-CM | POA: Diagnosis not present

## 2023-11-20 DIAGNOSIS — G43909 Migraine, unspecified, not intractable, without status migrainosus: Secondary | ICD-10-CM | POA: Diagnosis not present

## 2023-11-20 DIAGNOSIS — F419 Anxiety disorder, unspecified: Secondary | ICD-10-CM

## 2023-11-20 DIAGNOSIS — N182 Chronic kidney disease, stage 2 (mild): Secondary | ICD-10-CM | POA: Diagnosis not present

## 2023-11-20 DIAGNOSIS — D62 Acute posthemorrhagic anemia: Secondary | ICD-10-CM | POA: Diagnosis not present

## 2023-11-20 DIAGNOSIS — M150 Primary generalized (osteo)arthritis: Secondary | ICD-10-CM | POA: Diagnosis not present

## 2023-11-20 DIAGNOSIS — I5032 Chronic diastolic (congestive) heart failure: Secondary | ICD-10-CM | POA: Diagnosis not present

## 2023-11-20 DIAGNOSIS — E785 Hyperlipidemia, unspecified: Secondary | ICD-10-CM | POA: Diagnosis not present

## 2023-11-20 DIAGNOSIS — I130 Hypertensive heart and chronic kidney disease with heart failure and stage 1 through stage 4 chronic kidney disease, or unspecified chronic kidney disease: Secondary | ICD-10-CM | POA: Diagnosis not present

## 2023-11-20 DIAGNOSIS — M4726 Other spondylosis with radiculopathy, lumbar region: Secondary | ICD-10-CM | POA: Diagnosis not present

## 2023-11-20 DIAGNOSIS — T8452XA Infection and inflammatory reaction due to internal left hip prosthesis, initial encounter: Secondary | ICD-10-CM | POA: Diagnosis not present

## 2023-11-20 DIAGNOSIS — J449 Chronic obstructive pulmonary disease, unspecified: Secondary | ICD-10-CM | POA: Diagnosis not present

## 2023-11-20 DIAGNOSIS — I4892 Unspecified atrial flutter: Secondary | ICD-10-CM | POA: Diagnosis not present

## 2023-11-20 DIAGNOSIS — M5116 Intervertebral disc disorders with radiculopathy, lumbar region: Secondary | ICD-10-CM | POA: Diagnosis not present

## 2023-11-20 DIAGNOSIS — G2581 Restless legs syndrome: Secondary | ICD-10-CM | POA: Diagnosis not present

## 2023-11-20 DIAGNOSIS — I700 Atherosclerosis of aorta: Secondary | ICD-10-CM | POA: Diagnosis not present

## 2023-11-20 DIAGNOSIS — E559 Vitamin D deficiency, unspecified: Secondary | ICD-10-CM | POA: Diagnosis not present

## 2023-11-20 MED ORDER — BUSPIRONE HCL 5 MG PO TABS
5.0000 mg | ORAL_TABLET | Freq: Three times a day (TID) | ORAL | 1 refills | Status: DC | PRN
Start: 2023-11-20 — End: 2024-01-25

## 2023-11-21 ENCOUNTER — Other Ambulatory Visit: Payer: Self-pay | Admitting: Cardiovascular Disease

## 2023-11-21 DIAGNOSIS — T8452XA Infection and inflammatory reaction due to internal left hip prosthesis, initial encounter: Secondary | ICD-10-CM | POA: Diagnosis not present

## 2023-11-23 ENCOUNTER — Other Ambulatory Visit: Payer: Self-pay

## 2023-11-23 NOTE — Patient Instructions (Signed)
 Visit Information  Thank you for taking time to visit with me today. Please don't hesitate to contact me if I can be of assistance to you before our next scheduled telephone appointment.  Our next appointment is by telephone on Thursday May 22nd at 11:00am  Following is a copy of your care plan:   Goals Addressed             This Visit's Progress    VBCI Transitions of Care (TOC) Care Plan       Problems: (reviewed 11/23/23) Recent Hospitalization for treatment of Infection Equipment/DME barrier Equipment for Wound Vac Wound vac discontinued 11/01/23 PORT for antibiotics  Goal: (reviewed 11/23/23) Goal: On track Over the next 30 days, the patient will not experience hospital readmission Surgery (THR and post op infection 10/16/23): Evaluation of current treatment plan related to Post Op infection for [facility] surgery assessed patient/caregiver understanding of surgical procedure   reviewed post-operative instructions with patient/caregiver addressed questions about post - surgical incision care  reviewed medications with patient and addressed questions reviewed scheduled provider appointments with patientAppointment 10/27/23 with Dr. Clyda Dark - Completed Leave dressing in place in place for one week and assess for drainage. Call Dr, Noberto Bastos after one week to update - Due 5/9 (completed) Self administration of IV abx through PORT daily Exercises as instructed by Orthopedic surgeon. Do not put weight on left leg - The patient is able to stand and walk short distances with PT (11/16/23) - completed The patient is now able to bear weight bear on leg Assess Pain level daily. Report pain that is greater than a level 8 to the provider. Medicated with Tramadol  and Tylenol  as directed  Patient Self Care Activities:  (reviewed 11/23/23) Attend all scheduled provider appointments Call pharmacy for medication refills 3-7 days in advance of running out of medications Call provider office for new  concerns or questions  Notify RN Care Manager of [name] call rescheduling needs Participate in Transition of Care Program/Attend [facility] scheduled calls Perform all self care activities independently  Take medications as prescribed    Plan: (reviewed 11/23/23) Telephone follow up appointment with care management team member scheduled for:  Thursday May 22nd at 11:00pm         Patient verbalizes understanding of instructions and care plan provided today and agrees to view in MyChart. Active MyChart status and patient understanding of how to access instructions and care plan via MyChart confirmed with patient.     The patient has been provided with contact information for the care management team and has been advised to call with any health related questions or concerns.   Please call the care guide team at [phone] if you need to cancel or reschedule your appointment.   Please call the Suicide and Crisis Lifeline: 988 call the [place]  National Suicide Prevention Lifeline: 3166600354 or TTY: 234-784-1260 TTY 250-794-2855) to talk to a trained counselor if you are experiencing a Mental Health or Behavioral Health Crisis or need someone to talk to.  Gareld June, BSN, RN [place]  VBCI - [employer] [facility] RN Care Manager [phone]

## 2023-11-27 DIAGNOSIS — T8452XA Infection and inflammatory reaction due to internal left hip prosthesis, initial encounter: Secondary | ICD-10-CM | POA: Diagnosis not present

## 2023-11-30 ENCOUNTER — Other Ambulatory Visit: Payer: Self-pay

## 2023-11-30 ENCOUNTER — Ambulatory Visit (INDEPENDENT_AMBULATORY_CARE_PROVIDER_SITE_OTHER): Admitting: Family

## 2023-11-30 ENCOUNTER — Encounter: Payer: Self-pay | Admitting: Family

## 2023-11-30 VITALS — BP 146/86 | HR 89 | Temp 97.7°F | Ht 65.0 in | Wt 237.8 lb

## 2023-11-30 DIAGNOSIS — I10 Essential (primary) hypertension: Secondary | ICD-10-CM

## 2023-11-30 DIAGNOSIS — Z1211 Encounter for screening for malignant neoplasm of colon: Secondary | ICD-10-CM | POA: Diagnosis not present

## 2023-11-30 DIAGNOSIS — F325 Major depressive disorder, single episode, in full remission: Secondary | ICD-10-CM | POA: Diagnosis not present

## 2023-11-30 DIAGNOSIS — F419 Anxiety disorder, unspecified: Secondary | ICD-10-CM

## 2023-11-30 DIAGNOSIS — G2581 Restless legs syndrome: Secondary | ICD-10-CM | POA: Diagnosis not present

## 2023-11-30 DIAGNOSIS — F32A Depression, unspecified: Secondary | ICD-10-CM

## 2023-11-30 MED ORDER — ESCITALOPRAM OXALATE 20 MG PO TABS: 20.0000 mg | ORAL_TABLET | Freq: Every day | ORAL | 3 refills | Status: AC

## 2023-11-30 NOTE — Assessment & Plan Note (Addendum)
 Chronic, uncontrolled. Discussed impulse control on requip .  Increase lexapro  to 20mg . Continue xanax  0.25 mg twice daily as needed.  There are days in which she will take TID prn.

## 2023-11-30 NOTE — Assessment & Plan Note (Addendum)
 Chronic, stable.  she is pleased with requip  2mg  qhs . Monitor mood labilty.

## 2023-11-30 NOTE — Progress Notes (Signed)
 Assessment & Plan:   Depression, unspecified depression type -     Ambulatory referral to Psychology  Major depression in remission [facility]) Assessment & Plan: Chronic, uncontrolled. Discussed impulse control on requip .  Increase lexapro  to 20mg . Continue xanax  0.25 mg twice daily as needed.  There are days in which she will take TID prn.        Screen for colon cancer -     Ambulatory referral to Gastroenterology  RLS (restless legs syndrome) Assessment & Plan: Chronic, stable.  she is pleased with requip  2mg  qhs . Monitor mood labilty.    Anxiety and depression Assessment & Plan: Chronic, uncontrolled. Discussed impulse control on requip .  Increase lexapro  to 20mg . Continue xanax  0.25 mg twice daily as needed.  There are days in which she will take TID prn.       Orders: -     Escitalopram  Oxalate; Take 1 tablet (20 mg total) by mouth daily.  Dispense: 90 tablet; Refill: 3  Primary hypertension Assessment & Plan: Chronic, elevated today. Review of previous BP 11/10/23, under excellent control. She is tearful in the exam room which I suspect is contributory.  Advised to take her blood pressure greater than 140/80. Continue to monitor.       Return precautions given.   Risks, benefits, and alternatives of the medications and treatment plan prescribed today were discussed, and patient expressed understanding.   Education regarding symptom management and diagnosis given to patient on AVS either electronically or printed.  Return in about 1 month (around 12/31/2023).  Bascom Bossier, FNP  Subjective:    Patient ID: Amber Barnes, female    DOB: [birth year], 73 y.o.   MRN: [id number]  CC: Amber Barnes is a 73 y.o. female who presents today for an acute visit.    HPI: Complains of mood lability  She is 'crying one minute' and then frustrated the next minute.  Endorses anxiety around health. She is chronic pain, left hip , improved since hospitalization for  infection.   She has only recently started driving again.   Family has been supportive however she endorses struggling with loss of independence.   Denies SI/HI   She has follow up with Dr Clyda Dark, orthopedics, tomorrow. She is not doing PT at this time.      She is compliant with xanax  0.25 mg twice daily as needed.  There are days in which she will take TID prn.  She is also compliant with BuSpar  5 mg TID PRN, lexapro  10mg  every day  Follow-up with infectious disease 11/14/2023 for left hip prosthetic joint infection.  Status post surgical debridement, antibiotic bead placement 10/17/2023.  Daptomycin  IV therapy completed 11/27/2023 and then transition to oral Bactrim  for 3 months  Compliant with Eliquis , bisoprolol  15 mg twice daily   losartan -hydrochlorothiazide  50-12.5 mg PRN  History of right breast cancer.  Last seen Dr. Wilhelmenia Harada 08/17/2023.  Continue annual diagnostic mammogram scheduled 12/29/23  Recent left knee replacement 09/11/23 Qtc 442 08/25/23    Allergies: Hydrocodone , Prednisone , Amiodarone , Hydrocodone -acetaminophen , Tapentadol, Adhesive [tape], Latex, Oxycodone , Tramadol , and Wound dressing adhesive Current Outpatient Medications on File Prior to Visit  Medication Sig Dispense Refill   acetaminophen  (TYLENOL ) 500 MG tablet Take 2 tablets (1,000 mg total) by mouth every 8 (eight) hours. 30 tablet 0   ALPRAZolam  (XANAX ) 0.25 MG tablet TAKE 1 TABLET BY MOUTH TWICE A DAY AS NEEDED FOR ANXIETY 60 tablet 2   bisoprolol  (ZEBETA ) 10 MG tablet TAKE 1.5 TABLETS BY MOUTH 2  TIMES DAILY. 270 tablet 3   busPIRone  (BUSPAR ) 5 MG tablet Take 1 tablet (5 mg total) by mouth 3 (three) times daily as needed. 60 tablet 1   Calcium  Carbonate (CALCIUM  500 PO) Take 1 tablet by mouth daily.     Cholecalciferol  (VITAMIN D3) 5000 units CAPS Take 5,000 Units by mouth daily.     docusate sodium  (COLACE) 100 MG capsule Take 1 capsule (100 mg total) by mouth 2 (two) times daily. 10 capsule 0   ELIQUIS  5  MG TABS tablet TAKE 1 TABLET BY MOUTH TWICE A DAY 180 tablet 1   famotidine  (PEPCID ) 20 MG tablet Take 1 tablet (20 mg total) by mouth 2 (two) times daily. 60 tablet 0   furosemide  (LASIX ) 20 MG tablet Take 1 tablet (20 mg total) by mouth daily. May take additional 20mg  PO prn for leg swelling 180 tablet 3   KLOR-CON  M20 20 MEQ tablet TAKE 1 TABLET BY MOUTH EVERY DAY 90 tablet 3   losartan -hydrochlorothiazide  (HYZAAR) 50-12.5 MG tablet Take 0.5 tablets by mouth daily as needed (for BP 140/80). 30 tablet 1   metoCLOPramide  (REGLAN ) 10 MG tablet Take 1 tablet (10 mg total) by mouth every 6 (six) hours as needed. 30 tablet 0   Multiple Vitamin (MULTIVITAMIN) tablet Take 1 tablet by mouth daily.     ondansetron  (ZOFRAN ) 4 MG tablet Take 1 tablet (4 mg total) by mouth every 6 (six) hours as needed for nausea. 20 tablet 0   ranolazine  (RANEXA ) 500 MG 12 hr tablet TAKE 1 TABLET BY MOUTH TWICE A DAY 180 tablet 3   rOPINIRole  (REQUIP ) 2 MG tablet TAKE 1 TABLET BY MOUTH EVERYDAY AT BEDTIME 90 tablet 3   [Paused] rosuvastatin  (CRESTOR ) 40 MG tablet TAKE 1 TABLET BY MOUTH EVERY DAY 90 tablet 3   sulfamethoxazole -trimethoprim  (BACTRIM  DS) 800-160 MG tablet Take 1 tablet by mouth 2 (two) times daily. 60 tablet 1   traMADol  (ULTRAM ) 50 MG tablet Take 1-2 tablets (50-100 mg total) by mouth every 6 (six) hours as needed for moderate pain (pain score 4-6). 30 tablet 0   [DISCONTINUED] prochlorperazine  (COMPAZINE ) 10 MG tablet Take 1 tablet (10 mg total) by mouth every 6 (six) hours as needed (Nausea or vomiting). 30 tablet 1   No current facility-administered medications on file prior to visit.    Review of Systems  Constitutional:  Negative for chills and fever.  Respiratory:  Negative for cough.   Cardiovascular:  Negative for chest pain and palpitations.  Gastrointestinal:  Negative for nausea and vomiting.  Psychiatric/Behavioral:  Negative for suicidal ideas. The patient is nervous/anxious.        Objective:    BP (!) 146/86   Pulse 89   Temp 97.7 F (36.5 C)   Ht 5\' 5"  (1.651 m)   Wt 237 lb 12.8 oz (107.9 kg)   SpO2 99%   BMI 39.57 kg/m   BP Readings from Last 3 Encounters:  11/30/23 (!) 146/86  11/14/23 (!) 140/82  11/10/23 122/78   Wt Readings from Last 3 Encounters:  12/01/23 237 lb (107.5 kg)  11/30/23 237 lb 12.8 oz (107.9 kg)  11/23/23 232 lb (105.2 kg)      11/30/2023    9:08 AM 08/29/2023   11:31 AM 07/14/2023    9:14 AM  Depression screen PHQ 2/9  Decreased Interest 3 0 0  Down, Depressed, Hopeless 3 0 0  PHQ - 2 Score 6 0 0  Altered sleeping 3  Tired, decreased energy 3    Change in appetite 3    Feeling bad or failure about yourself  3    Trouble concentrating 3    Moving slowly or fidgety/restless 0    Suicidal thoughts 0    PHQ-9 Score 21    Difficult doing work/chores Somewhat difficult      Physical Exam Vitals reviewed.  Constitutional:      Appearance: She is well-developed.  Eyes:     Conjunctiva/sclera: Conjunctivae normal.  Cardiovascular:     Rate and Rhythm: Normal rate and regular rhythm.     Pulses: Normal pulses.     Heart sounds: Normal heart sounds.  Pulmonary:     Effort: Pulmonary effort is normal.     Breath sounds: Normal breath sounds. No wheezing, rhonchi or rales.  Skin:    General: Skin is warm and dry.  Neurological:     Mental Status: She is alert.  Psychiatric:        Speech: Speech normal.        Behavior: Behavior normal.        Thought Content: Thought content normal.

## 2023-12-01 ENCOUNTER — Other Ambulatory Visit: Payer: Self-pay | Admitting: Family

## 2023-12-01 ENCOUNTER — Other Ambulatory Visit

## 2023-12-01 DIAGNOSIS — Z96642 Presence of left artificial hip joint: Secondary | ICD-10-CM | POA: Diagnosis not present

## 2023-12-01 DIAGNOSIS — I10 Essential (primary) hypertension: Secondary | ICD-10-CM

## 2023-12-01 NOTE — Assessment & Plan Note (Addendum)
 Chronic, elevated today. Review of previous BP 11/10/23, under excellent control. She is tearful in the exam room which I suspect is contributory.  Advised to take her blood pressure greater than 140/80. Continue to monitor.

## 2023-12-01 NOTE — Patient Instructions (Signed)
 Visit Information  Thank you for taking time to visit with me today. Please don't hesitate to contact me if I can be of assistance to you before our next scheduled telephone appointment.  Following is a copy of your care plan:   Goals Addressed             This Visit's Progress    COMPLETED: VBCI Transitions of Care (TOC) Care Plan       Problems: (reviewed 12/01/23) Recent Hospitalization for treatment of Infection Equipment/DME barrier Equipment for Wound Vac Wound vac discontinued 11/01/23 PORT for antibiotics  Goal: (reviewed 12/01/23) Goal: On track Over the next 30 days, the patient will not experience hospital readmission Surgery (THR and post op infection 10/16/23): Evaluation of current treatment plan related to Post Op infection for [facility] surgery assessed patient/caregiver understanding of surgical procedure   reviewed post-operative instructions with patient/caregiver addressed questions about post - surgical incision care  reviewed medications with patient and addressed questions reviewed scheduled provider appointments with patientAppointment 10/27/23 with Dr. Clyda Dark - Completed Leave dressing in place in place for one week and assess for drainage. Call Dr, Noberto Bastos after one week to update - Due 5/9 (completed) Self administration of IV abx through PORT daily Exercises as instructed by Orthopedic surgeon. Do not put weight on left leg - The patient is able to stand and walk short distances with PT (11/16/23) - completed The patient is now able to bear weight bear on leg Assess Pain level daily. Report pain that is greater than a level 8 to the provider. Medicated with Tramadol  and Tylenol  as directed - referred to Physiatrist  Patient Self Care Activities:  (reviewed 12/01/23) Attend all scheduled provider appointments Call pharmacy for medication refills 3-7 days in advance of running out of medications Call provider office for new concerns or questions  Notify RN Care  Manager of [facility] call rescheduling needs Participate in Transition of Care Program/Attend TOC scheduled calls Perform all self care activities independently  Take medications as prescribed    Plan: (reviewed 12/01/23) Goals met.         Patient verbalizes understanding of instructions and care plan provided today and agrees to view in MyChart. Active MyChart status and patient understanding of how to access instructions and care plan via MyChart confirmed with patient.     The patient has been provided with contact information for the care management team and has been advised to call with any health related questions or concerns.   Please call the care guide team at [phone] if you need to cancel or reschedule your appointment.   Please call the Suicide and Crisis Lifeline: 988 call the [place]  National Suicide Prevention Lifeline: 760-630-7789 or TTY: 713-562-9315 TTY (939)763-0863) to talk to a trained counselor if you are experiencing a Mental Health or Behavioral Health Crisis or need someone to talk to.  Gareld June, BSN, RN [place]  VBCI - [employer] [facility] RN Care Manager [phone]

## 2023-12-01 NOTE — Transitions of Care (Post Inpatient/ED Visit) (Signed)
 Transition of Care Week 6  Visit Note  12/01/2023  Name: Amber Barnes MRN: [id number]          DOB: 12/10/1950  Situation: Patient enrolled in [facility] 30-day program. Visit completed with Anton Kirsten by telephone.   Background:     Past Medical History:  Diagnosis Date   Achilles tendinitis    Acute medial meniscal tear    Allergy    Anemia    Anxiety    a.) on BZO PRN (alprazolam )   Aortic atherosclerosis (HCC)    Ascending aorta dilatation (HCC) 10/20/2022   a.) TTE 10/20/2022: asc Ao 39 mm   Atrial fibrillation/flutter (HCC) 07/2016   a.) Dx'd 07/2016; b.) CHA2DS2-VASc = 5 (age, sex, HFmrEF, HTN, vasc disease history) as of 05/09/2023; c.) s/p DCCV 09/07/2016 (150 J x 2 --> A.flutter; 200 J x 1 --> NSR); d.) s/p DCCV 10/19/2016 (150 J x 1 --> NSR); e.) brought in for DCCV 12/2016; cancelled d/t NSR; f.) s/p PVI ablation 12/12/2022; g.) cardiac rate/rhythm maintained on oral bisoprolol ; chronically anticoagulated using apixaban    BMI 50.0-59.9, adult (HCC) 07/16/2018   CAD (coronary artery disease)    a.) cCTA 12/08/2022: Ca2+ = 31.6 (58th %'ile for age/sex/race match control)   Cardiomegaly    Chemotherapy induced nausea and vomiting 05/18/2021   Chronic heart failure with mid-range ejection fraction (HFmrEF) (HCC)    a.) TTE 03/08/21: EF 60-65, mild LVH, G1DD, mild LAE, triv MR; b.) TTE 07/29/21: EF 60-65, G1DD, mild LAE, triv MR; c.) TTE 11/26/21: EF 55-60, G1DD, mild LAE, mild MR; d.) TTE 02/25/22: EF 60-65, G1DD, mild LAE, mild MR; e.) TTE 05/31/22: EF 55-60, mild LVH, G2DD, mild-mod MR; f.) TTE 10/20/22: EF 45-50, mild LAE, mod red RVSF, mild-mod TR, Asc Ao 39; g.) TTE 12/13/22: EF 50-55, mild LVH, mild MAC   CKD (chronic kidney disease), stage II    Colon polyp    Complication of anesthesia    a.) emergence delirium; b.) hypoxia after hip replacement   COPD (chronic obstructive pulmonary disease) (HCC)    DDD (degenerative disc disease), lumbar    a.) s/p fusion    Depression    Endometriosis    Family history of CLL (chronic lymphoid leukemia) 03/21/2020   Fibrocystic breast disease    GERD (gastroesophageal reflux disease)    History of kidney stones    History of stress test    a.) MV 07/29/2016: EF 55-65%. Small, mild defect  in mid anteroseptal, apical septal, and apical locations. No ischemia-->low risk; b.) MV 10/2022: mod sized partially rev apical ant, apical septal, and apical defect. EF 52%. No signific cor Ca2+; c.) cCTA 12/08/2022: Ca2+ = 31.6 (58th %'ile).   History of tobacco use    Hyperlipidemia    Hypertension    Hypothyroidism    a.) s/p partial thyroidectomy for nodule; pathology benign   Insomnia    Left knee pain, unspecified chronicity    Lipoma    Malignant neoplasm of upper-outer quadrant of right female breast (HCC) 03/03/2021   a.) stage 1A invasive mammary carcinoma (cT1b or T1c N0); ER (-), PR (+), HER2/neu (+); did not tolerate adjuvant chemotherapy (paclitacel + trastuzumab ) despite paclitaxel  dose reduction; s/p adjuvant XRT; cinished maintenance trastuzumab  07/27/2022; no adjuvant endocrine therapy as no clear benefit   Migraine    Nephrolithiasis    On apixaban  therapy    OSA (obstructive sleep apnea)    a.) non-compliant with nocturnal PAP therapy   Osteoarthritis  Osteopenia    PAF (paroxysmal atrial fibrillation) (HCC) 12/13/2022   Peptic ulcer disease    a.) H. pylori   Pericarditis 12/12/2022   a.) following A.fib ablation; resolved   Pneumonia    Port-A-Cath in place    Primary localized osteoarthritis of pelvic region and thigh 03/02/2022   RLS (restless legs syndrome)    a.) on ropinirole    S/P TKR (total knee replacement), left 09/11/2023   S/P total left hip arthroplasty 05/11/2023   Vertigo 06/08/2016   Vitamin D  deficiency     Assessment: Patient Reported Symptoms: Cognitive Cognitive Status: Alert and oriented to person, place, and time   Health Facilitated by: Pain control   Neurological Neurological Review of Symptoms: No symptoms reported    HEENT HEENT Symptoms Reported: No symptoms reported      Cardiovascular Cardiovascular Symptoms Reported: No symptoms reported Does patient have uncontrolled Hypertension?: No Cardiovascular Conditions: Dysrhythmia, Heart failure Cardiovascular Management Strategies: Medication therapy Do You Have a Working Readable Scale?: Yes Weight: 237 lb (107.5 kg) Cardiovascular Self-Management Outcome: 4 (good)  Respiratory Respiratory Symptoms Reported: No symptoms reported    Endocrine Patient reports the following symptoms related to hypoglycemia or hyperglycemia : No symptoms reported Is patient diabetic?: No    Gastrointestinal Gastrointestinal Symptoms Reported: Constipation Gastrointestinal Conditions: Constipation Gastrointestinal Management Strategies: Medication therapy Gastrointestinal Self-Management Outcome: 4 (good) Gastrointestinal Comment: The nausea has stopped currently Nutrition Risk Screen (CP): No indicators present  Genitourinary Genitourinary Symptoms Reported: Incontinence Genitourinary Conditions: Incontinence Genitourinary Management Strategies: Activity, Incontinence garment/pad Genitourinary Self-Management Outcome: 4 (good)  Integumentary Additional Integumentary Details: PORT Skin Self-Management Outcome: 4 (good) Skin Comment: Incision has healed  Musculoskeletal Musculoskelatal Symptoms Reviewed: Muscle pain, Difficulty walking Additional Musculoskeletal Details: She is independent with ambulation with a cane Musculoskeletal Conditions: Joint pain, Mobility limited Other Musculoskeletal Conditions: Post TKR and THR Musculoskeletal Management Strategies: Medication therapy, Exercise, Adequate rest Musculoskeletal Self-Management Outcome: 4 (good) Musculoskeletal Comment: Improving Falls in the past year?: Yes Number of falls in past year: 1 or less Was there an injury with Fall?:  No Fall Risk Category Calculator: 1 Patient Fall Risk Level: Low Fall Risk Patient at Risk for Falls Due to: Orthopedic patient  Psychosocial Psychosocial Symptoms Reported: Depression - if selected complete PHQ 2-9 Behavioral Health Conditions: Depression Behavioral Management Strategies: Counseling, Coping strategies Behavioral Health Self-Management Outcome: 4 (good) Behavioral Health Comment: She is going to return to counseling   Do you feel physically threatened by others?: No   There were no vitals filed for this visit.  Medications Reviewed Today     Reviewed by Claudene Crystal, RN (Case Manager) on 12/01/23 at 1443  Med List Status: <None>   Medication Order Taking? Sig Documenting Provider Last Dose Status Informant  acetaminophen  (TYLENOL ) 500 MG tablet [id number] No Take 2 tablets (1,000 mg total) by mouth every 8 (eight) hours. Coralyn Derry, PA-C Taking Active Self  ALPRAZolam  (XANAX ) 0.25 MG tablet [id number] No TAKE 1 TABLET BY MOUTH TWICE A DAY AS NEEDED FOR ANXIETY Calista Catching, FNP Taking Active   bisoprolol  (ZEBETA ) 10 MG tablet [id number] No TAKE 1.5 TABLETS BY MOUTH 2 TIMES DAILY. Gollan, Timothy J, MD Taking Active   busPIRone  (BUSPAR ) 5 MG tablet [id number] No Take 1 tablet (5 mg total) by mouth 3 (three) times daily as needed. Calista Catching, FNP Taking Active   Calcium  Carbonate (CALCIUM  500 PO) [id number] No Take 1 tablet by mouth daily. [provider] Taking Active Self  Cholecalciferol  (VITAMIN  D3) 5000 units CAPS [id number] No Take 5,000 Units by mouth daily. [provider] Taking Active Self, Pharmacy Records  docusate sodium  (COLACE) 100 MG capsule [id number] No Take 1 capsule (100 mg total) by mouth 2 (two) times daily. Coralyn Derry, PA-C Taking Active Self  ELIQUIS  5 MG TABS tablet [id number] No TAKE 1 TABLET BY MOUTH TWICE A DAY Gollan, Timothy J, MD Taking Active Self, Pharmacy Records  escitalopram  (LEXAPRO ) 20 MG tablet  [id number]  Take 1 tablet (20 mg total) by mouth daily. Calista Catching, FNP  Active   famotidine  (PEPCID ) 20 MG tablet [id number] No Take 1 tablet (20 mg total) by mouth 2 (two) times daily. Jacquie Maudlin, MD Taking Active   furosemide  (LASIX ) 20 MG tablet [id number] No Take 1 tablet (20 mg total) by mouth daily. May take additional 20mg  PO prn for leg swelling Calista Catching, FNP Taking Active Self, Pharmacy Records  KLOR-CON  M20 20 MEQ tablet [id number] No TAKE 1 TABLET BY MOUTH EVERY DAY Gollan, Timothy J, MD Taking Active   losartan -hydrochlorothiazide  (HYZAAR) 50-12.5 MG tablet [id number] No Take 0.5 tablets by mouth daily as needed (for BP 140/80). Calista Catching, FNP Taking Active Self, Pharmacy Records           Med Note Blondie Burke   Fri Aug 25, 2023  5:53 PM) prn  metoCLOPramide  (REGLAN ) 10 MG tablet [id number] No Take 1 tablet (10 mg total) by mouth every 6 (six) hours as needed. Jacquie Maudlin, MD Taking Active   Multiple Vitamin (MULTIVITAMIN) tablet [id number] No Take 1 tablet by mouth daily. [provider] Taking Active Self, Pharmacy Records  ondansetron  (ZOFRAN ) 4 MG tablet [id number] No Take 1 tablet (4 mg total) by mouth every 6 (six) hours as needed for nausea. Coralyn Derry, PA-C Taking Active Self  Discontinued 03/02/22 1309 ranolazine  (RANEXA ) 500 MG 12 hr tablet [id number] No TAKE 1 TABLET BY MOUTH TWICE A DAY Gollan, Timothy J, MD Taking Active   rOPINIRole  (REQUIP ) 2 MG tablet [id number] No TAKE 1 TABLET BY MOUTH EVERYDAY AT BEDTIME Calista Catching, FNP Taking Active   rosuvastatin  (CRESTOR ) 40 MG tablet [id number] No TAKE 1 TABLET BY MOUTH EVERY DAY Calista Catching, FNP Taking Active Self, Pharmacy Records  sulfamethoxazole -trimethoprim  (BACTRIM  DS) 800-160 MG tablet [id number] No Take 1 tablet by mouth 2 (two) times daily. Alica Inks, MD Taking Active   traMADol  (ULTRAM ) 50 MG tablet [id number] No Take 1-2 tablets (50-100 mg  total) by mouth every 6 (six) hours as needed for moderate pain (pain score 4-6). Coralyn Derry, PA-C Taking Active   Med List Note Blondie Burke, CPhT 04/06/21 0113): UDS 02/27/2018             Recommendation:   Follow Up with PCP and Orthopedic  Follow Up Plan:   Closing From:  Transitions of Care Program  [facility], BSN, RN [place]  VBCI - [facility] RN Care Manager [phone]

## 2023-12-01 NOTE — Telephone Encounter (Unsigned)
 Copied from CRM [id number]. Topic: Clinical - Medication Refill >> Dec 01, 2023  4:30 PM Amber Barnes wrote: Medication: losartan -hydrochlorothiazide  (HYZAAR) 50-12.5 MG tablet  Has the patient contacted their pharmacy? Yes (Agent: If no, request that the patient contact the pharmacy for the refill. If patient does not wish to contact the pharmacy document the reason why and proceed with request.) (Agent: If yes, when and what did the pharmacy advise?)  This is the patient's preferred pharmacy:  [id number] [place] [facility] - [address] [place] [address] Phone: [phone] Fax: [phone]      Is this the correct pharmacy for this prescription? Yes If no, delete pharmacy and type the correct one.   Has the prescription been filled recently? No  Is the patient out of the medication? Yes  Has the patient been seen for an appointment in the last year OR does the patient have an upcoming appointment? Yes  Can we respond through MyChart? Yes  Agent: Please be advised that Rx refills may take up to 3 business days. We ask that you follow-up with your pharmacy. *[employer] would like to know if patient could have 100 day supply,covered under his benefits**

## 2023-12-05 ENCOUNTER — Telehealth: Payer: Self-pay

## 2023-12-05 MED ORDER — LOSARTAN POTASSIUM-HCTZ 50-12.5 MG PO TABS
0.5000 | ORAL_TABLET | Freq: Every day | ORAL | 1 refills | Status: AC | PRN
Start: 2023-12-05 — End: ?

## 2023-12-05 NOTE — Telephone Encounter (Signed)
-----   Message from Alica Inks sent at 12/05/2023  2:06 PM EDT ----- Pt needs CBC with diff and CMP next Monday- I have placed the order- She is now on bactrim  after completing IV- can you let her kniw and also need to check whether PICC was removed- thx She has a follow up with me for the Prosthetic joint infeciton In July

## 2023-12-05 NOTE — Telephone Encounter (Signed)
 Patient aware to have labs drawn on Monday. Picc line has been removed. Juanita Devincent Adel Holt, CMA

## 2023-12-07 DIAGNOSIS — I700 Atherosclerosis of aorta: Secondary | ICD-10-CM | POA: Diagnosis not present

## 2023-12-07 DIAGNOSIS — E785 Hyperlipidemia, unspecified: Secondary | ICD-10-CM | POA: Diagnosis not present

## 2023-12-07 DIAGNOSIS — G4700 Insomnia, unspecified: Secondary | ICD-10-CM | POA: Diagnosis not present

## 2023-12-07 DIAGNOSIS — M48061 Spinal stenosis, lumbar region without neurogenic claudication: Secondary | ICD-10-CM | POA: Diagnosis not present

## 2023-12-07 DIAGNOSIS — G43909 Migraine, unspecified, not intractable, without status migrainosus: Secondary | ICD-10-CM | POA: Diagnosis not present

## 2023-12-07 DIAGNOSIS — D631 Anemia in chronic kidney disease: Secondary | ICD-10-CM | POA: Diagnosis not present

## 2023-12-07 DIAGNOSIS — I480 Paroxysmal atrial fibrillation: Secondary | ICD-10-CM | POA: Diagnosis not present

## 2023-12-07 DIAGNOSIS — T8452XA Infection and inflammatory reaction due to internal left hip prosthesis, initial encounter: Secondary | ICD-10-CM | POA: Diagnosis not present

## 2023-12-07 DIAGNOSIS — J449 Chronic obstructive pulmonary disease, unspecified: Secondary | ICD-10-CM | POA: Diagnosis not present

## 2023-12-07 DIAGNOSIS — I5032 Chronic diastolic (congestive) heart failure: Secondary | ICD-10-CM | POA: Diagnosis not present

## 2023-12-07 DIAGNOSIS — D62 Acute posthemorrhagic anemia: Secondary | ICD-10-CM | POA: Diagnosis not present

## 2023-12-07 DIAGNOSIS — E039 Hypothyroidism, unspecified: Secondary | ICD-10-CM | POA: Diagnosis not present

## 2023-12-07 DIAGNOSIS — G2581 Restless legs syndrome: Secondary | ICD-10-CM | POA: Diagnosis not present

## 2023-12-07 DIAGNOSIS — K219 Gastro-esophageal reflux disease without esophagitis: Secondary | ICD-10-CM | POA: Diagnosis not present

## 2023-12-07 DIAGNOSIS — I130 Hypertensive heart and chronic kidney disease with heart failure and stage 1 through stage 4 chronic kidney disease, or unspecified chronic kidney disease: Secondary | ICD-10-CM | POA: Diagnosis not present

## 2023-12-07 DIAGNOSIS — M4726 Other spondylosis with radiculopathy, lumbar region: Secondary | ICD-10-CM | POA: Diagnosis not present

## 2023-12-07 DIAGNOSIS — M5116 Intervertebral disc disorders with radiculopathy, lumbar region: Secondary | ICD-10-CM | POA: Diagnosis not present

## 2023-12-07 DIAGNOSIS — M150 Primary generalized (osteo)arthritis: Secondary | ICD-10-CM | POA: Diagnosis not present

## 2023-12-07 DIAGNOSIS — N182 Chronic kidney disease, stage 2 (mild): Secondary | ICD-10-CM | POA: Diagnosis not present

## 2023-12-07 DIAGNOSIS — E559 Vitamin D deficiency, unspecified: Secondary | ICD-10-CM | POA: Diagnosis not present

## 2023-12-07 DIAGNOSIS — I4892 Unspecified atrial flutter: Secondary | ICD-10-CM | POA: Diagnosis not present

## 2023-12-07 DIAGNOSIS — I2510 Atherosclerotic heart disease of native coronary artery without angina pectoris: Secondary | ICD-10-CM | POA: Diagnosis not present

## 2023-12-11 ENCOUNTER — Other Ambulatory Visit
Admission: RE | Admit: 2023-12-11 | Discharge: 2023-12-11 | Disposition: A | Discharge status: Home / Self Care | Attending: Infectious Diseases | Admitting: Infectious Diseases

## 2023-12-11 DIAGNOSIS — T8450XD Infection and inflammatory reaction due to unspecified internal joint prosthesis, subsequent encounter: Secondary | ICD-10-CM | POA: Insufficient documentation

## 2023-12-11 LAB — CBC WITH DIFFERENTIAL/PLATELET
Abs Immature Granulocytes: 0.01 10*3/uL (ref 0.00–0.07)
Basophils Absolute: 0 10*3/uL (ref 0.0–0.1)
Basophils Relative: 1 %
Eosinophils Absolute: 0.1 10*3/uL (ref 0.0–0.5)
Eosinophils Relative: 3 %
HCT: 34.3 % — ABNORMAL LOW (ref 36.0–46.0)
Hemoglobin: 11.3 g/dL — ABNORMAL LOW (ref 12.0–15.0)
Immature Granulocytes: 0 %
Lymphocytes Relative: 21 %
Lymphs Abs: 0.8 10*3/uL (ref 0.7–4.0)
MCH: 29.9 pg (ref 26.0–34.0)
MCHC: 32.9 g/dL (ref 30.0–36.0)
MCV: 90.7 fL (ref 80.0–100.0)
Monocytes Absolute: 0.3 10*3/uL (ref 0.1–1.0)
Monocytes Relative: 7 %
Neutro Abs: 2.7 10*3/uL (ref 1.7–7.7)
Neutrophils Relative %: 68 %
Platelets: 162 10*3/uL (ref 150–400)
RBC: 3.78 MIL/uL — ABNORMAL LOW (ref 3.87–5.11)
RDW: 14 % (ref 11.5–15.5)
WBC: 3.9 10*3/uL — ABNORMAL LOW (ref 4.0–10.5)
nRBC: 0 % (ref 0.0–0.2)

## 2023-12-11 LAB — COMPREHENSIVE METABOLIC PANEL WITH GFR
ALT: 13 U/L (ref 0–44)
AST: 17 U/L (ref 15–41)
Albumin: 3.4 g/dL — ABNORMAL LOW (ref 3.5–5.0)
Alkaline Phosphatase: 69 U/L (ref 38–126)
Anion gap: 9 (ref 5–15)
BUN: 21 mg/dL (ref 8–23)
CO2: 22 mmol/L (ref 22–32)
Calcium: 8.4 mg/dL — ABNORMAL LOW (ref 8.9–10.3)
Chloride: 107 mmol/L (ref 98–111)
Creatinine, Ser: 0.83 mg/dL (ref 0.44–1.00)
GFR, Estimated: >60 mL/min (ref >60)
Glucose, Bld: 113 mg/dL — ABNORMAL HIGH (ref 70–99)
Potassium: 4.2 mmol/L (ref 3.5–5.1)
Sodium: 138 mmol/L (ref 135–145)
Total Bilirubin: 0.5 mg/dL (ref 0.0–1.2)
Total Protein: 6.7 g/dL (ref 6.5–8.1)

## 2023-12-12 ENCOUNTER — Ambulatory Visit: Payer: Self-pay

## 2023-12-13 ENCOUNTER — Encounter: Payer: Self-pay | Admitting: Infectious Diseases

## 2023-12-25 ENCOUNTER — Ambulatory Visit (INDEPENDENT_AMBULATORY_CARE_PROVIDER_SITE_OTHER): Payer: Medicare Other | Admitting: *Deleted

## 2023-12-25 ENCOUNTER — Other Ambulatory Visit: Payer: Self-pay | Admitting: Orthopedic Surgery

## 2023-12-25 VITALS — Ht 65.0 in | Wt 235.0 lb

## 2023-12-25 DIAGNOSIS — Z0000 Encounter for general adult medical examination without abnormal findings: Secondary | ICD-10-CM | POA: Diagnosis not present

## 2023-12-25 DIAGNOSIS — Z780 Asymptomatic menopausal state: Secondary | ICD-10-CM | POA: Diagnosis not present

## 2023-12-25 NOTE — Progress Notes (Signed)
 Subjective:   Amber Barnes is a 73 y.o. who presents for a Medicare Wellness preventive visit.  As a reminder, Annual Wellness Visits don't include a physical exam, and some assessments may be limited, especially if this visit is performed virtually. We may recommend an in-person follow-up visit with your provider if needed.  Visit Complete: Virtual I connected with  Amber Barnes on 12/25/23 by a audio enabled telemedicine application and verified that I am speaking with the correct person using two identifiers.  Patient Location: Home  Provider Location: Home Office  I discussed the limitations of evaluation and management by telemedicine. The patient expressed understanding and agreed to proceed.  Vital Signs: Because this visit was a virtual/telehealth visit, some criteria may be missing or patient reported. Any vitals not documented were not able to be obtained and vitals that have been documented are patient reported.  VideoDeclined- This patient declined Librarian, academic. Therefore the visit was completed with audio only.  Persons Participating in Visit: Patient.  AWV Questionnaire: No: Patient Medicare AWV questionnaire was not completed prior to this visit.  Cardiac Risk Factors include: advanced age (>1men, >62 women);hypertension;obesity (BMI >30kg/m2);dyslipidemia;Other (see comment), Risk factor comments: AFib     Objective:    Today's Vitals   12/25/23 0853  Weight: 235 lb (106.6 kg)  Height: 5' 5 (1.651 m)   Body mass index is 39.11 kg/m.     12/25/2023    9:07 AM 11/03/2023    8:24 AM 10/17/2023    2:58 PM 10/15/2023   11:05 PM 09/11/2023    8:56 AM 09/04/2023    2:34 PM 08/25/2023   10:31 AM  Advanced Directives  Does Patient Have a Medical Advance Directive? Yes Yes No No No;Yes No No  Type of Estate agent of [place];Living will Healthcare Power of [employer] of [place];Living will     Does patient want to make changes to medical advance directive?     No - Patient declined    Copy of Healthcare Power of Attorney in Chart? No - copy requested No - copy requested   No - copy requested    Would patient like information on creating a medical advance directive?   No - Patient declined No - Patient declined No - Patient declined No - Patient declined     Current Medications (verified) Outpatient Encounter Medications as of 12/25/2023  Medication Sig   acetaminophen  (TYLENOL ) 500 MG tablet Take 2 tablets (1,000 mg total) by mouth every 8 (eight) hours.   ALPRAZolam  (XANAX ) 0.25 MG tablet TAKE 1 TABLET BY MOUTH TWICE A DAY AS NEEDED FOR ANXIETY   bisoprolol  (ZEBETA ) 10 MG tablet TAKE 1.5 TABLETS BY MOUTH 2 TIMES DAILY.   busPIRone  (BUSPAR ) 5 MG tablet Take 1 tablet (5 mg total) by mouth 3 (three) times daily as needed.   Calcium  Carbonate (CALCIUM  500 PO) Take 1 tablet by mouth daily.   Cholecalciferol  (VITAMIN D3) 5000 units CAPS Take 5,000 Units by mouth daily.   docusate sodium  (COLACE) 100 MG capsule Take 1 capsule (100 mg total) by mouth 2 (two) times daily.   ELIQUIS  5 MG TABS tablet TAKE 1 TABLET BY MOUTH TWICE A DAY   escitalopram  (LEXAPRO ) 20 MG tablet Take 1 tablet (20 mg total) by mouth daily.   famotidine  (PEPCID ) 20 MG tablet Take 1 tablet (20 mg total) by mouth 2 (two) times daily.   furosemide  (LASIX ) 20 MG tablet Take 1 tablet (  20 mg total) by mouth daily. May take additional 20mg  PO prn for leg swelling   KLOR-CON  M20 20 MEQ tablet TAKE 1 TABLET BY MOUTH EVERY DAY   losartan -hydrochlorothiazide  (HYZAAR) 50-12.5 MG tablet Take 0.5 tablets by mouth daily as needed (for BP 140/80).   metoCLOPramide  (REGLAN ) 10 MG tablet Take 1 tablet (10 mg total) by mouth every 6 (six) hours as needed.   Multiple Vitamin (MULTIVITAMIN) tablet Take 1 tablet by mouth daily.   ondansetron  (ZOFRAN ) 4 MG tablet Take 1 tablet (4 mg total) by mouth every 6 (six) hours as needed for  nausea.   ranolazine  (RANEXA ) 500 MG 12 hr tablet TAKE 1 TABLET BY MOUTH TWICE A DAY   rOPINIRole  (REQUIP ) 2 MG tablet TAKE 1 TABLET BY MOUTH EVERYDAY AT BEDTIME   [Paused] rosuvastatin  (CRESTOR ) 40 MG tablet TAKE 1 TABLET BY MOUTH EVERY DAY   sulfamethoxazole -trimethoprim  (BACTRIM  DS) 800-160 MG tablet Take 1 tablet by mouth 2 (two) times daily.   traMADol  (ULTRAM ) 50 MG tablet Take 1-2 tablets (50-100 mg total) by mouth every 6 (six) hours as needed for moderate pain (pain score 4-6).   [DISCONTINUED] prochlorperazine  (COMPAZINE ) 10 MG tablet Take 1 tablet (10 mg total) by mouth every 6 (six) hours as needed (Nausea or vomiting).   No facility-administered encounter medications on file as of 12/25/2023.    Allergies (verified) Hydrocodone , Prednisone , Amiodarone , Hydrocodone -acetaminophen , Tapentadol, Adhesive [tape], Latex, Oxycodone , Tramadol , and Wound dressing adhesive   History: Past Medical History:  Diagnosis Date   Achilles tendinitis    Acute medial meniscal tear    Allergy    Anemia    Anxiety    a.) on BZO PRN (alprazolam )   Aortic atherosclerosis (HCC)    Ascending aorta dilatation (HCC) 10/20/2022   a.) TTE 10/20/2022: asc Ao 39 mm   Atrial fibrillation/flutter (HCC) 07/2016   a.) Dx'd 07/2016; b.) CHA2DS2-VASc = 5 (age, sex, HFmrEF, HTN, vasc disease history) as of 05/09/2023; c.) s/p DCCV 09/07/2016 (150 J x 2 --> A.flutter; 200 J x 1 --> NSR); d.) s/p DCCV 10/19/2016 (150 J x 1 --> NSR); e.) brought in for DCCV 12/2016; cancelled d/t NSR; f.) s/p PVI ablation 12/12/2022; g.) cardiac rate/rhythm maintained on oral bisoprolol ; chronically anticoagulated using apixaban    BMI 50.0-59.9, adult (HCC) 07/16/2018   CAD (coronary artery disease)    a.) cCTA 12/08/2022: Ca2+ = 31.6 (58th %'ile for age/sex/race match control)   Cardiomegaly    Chemotherapy induced nausea and vomiting 05/18/2021   Chronic heart failure with mid-range ejection fraction (HFmrEF) (HCC)    a.) TTE  03/08/21: EF 60-65, mild LVH, G1DD, mild LAE, triv MR; b.) TTE 07/29/21: EF 60-65, G1DD, mild LAE, triv MR; c.) TTE 11/26/21: EF 55-60, G1DD, mild LAE, mild MR; d.) TTE 02/25/22: EF 60-65, G1DD, mild LAE, mild MR; e.) TTE 05/31/22: EF 55-60, mild LVH, G2DD, mild-mod MR; f.) TTE 10/20/22: EF 45-50, mild LAE, mod red RVSF, mild-mod TR, Asc Ao 39; g.) TTE 12/13/22: EF 50-55, mild LVH, mild MAC   CKD (chronic kidney disease), stage II    Colon polyp    Complication of anesthesia    a.) emergence delirium; b.) hypoxia after hip replacement   COPD (chronic obstructive pulmonary disease) (HCC)    DDD (degenerative disc disease), lumbar    a.) s/p fusion   Depression    Endometriosis    Family history of CLL (chronic lymphoid leukemia) 03/21/2020   Fibrocystic breast disease    GERD (gastroesophageal reflux  disease)    History of kidney stones    History of stress test    a.) MV 07/29/2016: EF 55-65%. Small, mild defect  in mid anteroseptal, apical septal, and apical locations. No ischemia-->low risk; b.) MV 10/2022: mod sized partially rev apical ant, apical septal, and apical defect. EF 52%. No signific cor Ca2+; c.) cCTA 12/08/2022: Ca2+ = 31.6 (58th %'ile).   History of tobacco use    Hyperlipidemia    Hypertension    Hypothyroidism    a.) s/p partial thyroidectomy for nodule; pathology benign   Insomnia    Left knee pain, unspecified chronicity    Lipoma    Malignant neoplasm of upper-outer quadrant of right female breast (HCC) 03/03/2021   a.) stage 1A invasive mammary carcinoma (cT1b or T1c N0); ER (-), PR (+), HER2/neu (+); did not tolerate adjuvant chemotherapy (paclitacel + trastuzumab ) despite paclitaxel  dose reduction; s/p adjuvant XRT; cinished maintenance trastuzumab  07/27/2022; no adjuvant endocrine therapy as no clear benefit   Migraine    Nephrolithiasis    On apixaban  therapy    OSA (obstructive sleep apnea)    a.) non-compliant with nocturnal PAP therapy   Osteoarthritis     Osteopenia    PAF (paroxysmal atrial fibrillation) (HCC) 12/13/2022   Peptic ulcer disease    a.) H. pylori   Pericarditis 12/12/2022   a.) following A.fib ablation; resolved   Pneumonia    Port-A-Cath in place    Primary localized osteoarthritis of pelvic region and thigh 03/02/2022   RLS (restless legs syndrome)    a.) on ropinirole    S/P TKR (total knee replacement), left 09/11/2023   S/P total left hip arthroplasty 05/11/2023   Vertigo 06/08/2016   Vitamin D  deficiency    Past Surgical History:  Procedure Laterality Date   ABDOMINAL HYSTERECTOMY     ovaries left   ACHILLES TENDON SURGERY     2012,01/2017   ATRIAL FIBRILLATION ABLATION N/A 12/12/2022   Procedure: ATRIAL FIBRILLATION ABLATION;  Surgeon: Boyce Byes, MD;  Location: [facility];  Service: Cardiovascular;  Laterality: N/A;   BREAST BIOPSY Right 01/15/2021   Affirm biopsy-X clip-INVASIVE MAMMARY CARCINOMA   BREAST BIOPSY Right 01/15/2021   u/s bx-venus clip INVASIVE MAMMARY CARCINOMA   BREAST BIOPSY     BREAST BIOPSY Right 01/04/2023   u/s bx ribbon clip path pending   BREAST BIOPSY Right 01/04/2023   US  RT BREAST BX W LOC DEV 1ST LESION IMG BX SPEC US  GUIDE 01/04/2023 [facility]   BREAST LUMPECTOMY,RADIO FREQ LOCALIZER,AXILLARY SENTINEL LYMPH NODE BIOPSY Right 02/17/2021   Procedure: BREAST LUMPECTOMY,RADIO FREQ LOCALIZER,AXILLARY SENTINEL LYMPH NODE BIOPSY;  Surgeon: Flynn Hylan, MD;  Location: [facility];  Service: General;  Laterality: Right;   CARDIOVERSION N/A 09/07/2016   Procedure: CARDIOVERSION;  Surgeon: Devorah Fonder, MD;  Location: [facility];  Service: Cardiovascular;  Laterality: N/A;   CARDIOVERSION N/A 10/19/2016   Procedure: CARDIOVERSION;  Surgeon: Devorah Fonder, MD;  Location: [facility];  Service: Cardiovascular;  Laterality: N/A;   CARDIOVERSION N/A 12/14/2016   Procedure: CARDIOVERSION;  Surgeon: Devorah Fonder, MD;  Location: [facility];  Service:  Cardiovascular;  Laterality: N/A;   COLONOSCOPY     COLONOSCOPY WITH PROPOFOL  N/A 05/13/2020   Procedure: COLONOSCOPY WITH PROPOFOL ;  Surgeon: Toledo, Alphonsus Jeans, MD;  Location: [facility];  Service: Gastroenterology;  Laterality: N/A;  ELIQUIS    CYSTOSCOPY WITH STENT PLACEMENT Bilateral 07/12/2021   Procedure: CYSTOSCOPY WITH STENT PLACEMENT;  Surgeon: Lawerence Pressman, MD;  Location: [facility];  Service: Urology;  Laterality: Bilateral;   CYSTOSCOPY/URETEROSCOPY/HOLMIUM LASER/STENT PLACEMENT Bilateral 07/30/2021   Procedure: CYSTOSCOPY/URETEROSCOPY/HOLMIUM LASER/BILATERAL STENT REMOVAL;  Surgeon: Lawerence Pressman, MD;  Location: [facility];  Service: Urology;  Laterality: Bilateral;   ESOPHAGOGASTRODUODENOSCOPY     ESOPHAGOGASTRODUODENOSCOPY (EGD) WITH PROPOFOL  N/A 05/13/2020   Procedure: ESOPHAGOGASTRODUODENOSCOPY (EGD) WITH PROPOFOL ;  Surgeon: Toledo, Alphonsus Jeans, MD;  Location: [facility];  Service: Gastroenterology;  Laterality: N/A;   EXCISIONAL TOTAL HIP ARTHROPLASTY WITH ANTIBIOTIC SPACERS Left 10/17/2023   Procedure: EXCISIONAL TOTAL HIP ARTHROPLASTY WITH PLACEMENT OF ANTIBIOTIC BEADS AND DRAIN;  Surgeon: Venus Ginsberg, MD;  Location: [facility];  Service: Orthopedics;  Laterality: Left;   FOOT SURGERY Left    tendon   LUMBAR FUSION     PORTA CATH INSERTION N/A 03/23/2021   Procedure: PORTA CATH INSERTION;  Surgeon: Jackquelyn Mass, MD;  Location: [facility];  Service: Cardiovascular;  Laterality: N/A;   PORTACATH PLACEMENT N/A 03/23/2021   Procedure: ATTEMPTED INSERTION PORT-A-CATH;  Surgeon: Flynn Hylan, MD;  Location: [facility];  Service: General;  Laterality: N/A;   THYROIDECTOMY  1990   half thyroid  lobectomy secondary to a benign nodule   TOTAL HIP ARTHROPLASTY Right 08/21/2018   Procedure: TOTAL HIP ARTHROPLASTY ANTERIOR APPROACH-RIGHT CELL SAVER REQUESTED;  Surgeon: Molli Angelucci, MD;  Location: [facility];  Service: Orthopedics;  Laterality: Right;   TOTAL  HIP ARTHROPLASTY Left 05/11/2023   Procedure: TOTAL HIP ARTHROPLASTY ANTERIOR APPROACH;  Surgeon: Venus Ginsberg, MD;  Location: [facility];  Service: Orthopedics;  Laterality: Left;   TOTAL KNEE ARTHROPLASTY Left 09/11/2023   Procedure: ARTHROPLASTY, KNEE, TOTAL;  Surgeon: Venus Ginsberg, MD;  Location: [facility];  Service: Orthopedics;  Laterality: Left;   Family History  Problem Relation Age of Onset   Cancer Mother        leukemia   Cancer Father 28       colon   Heart disease Father    Heart attack Father 9       died from MI   Hyperlipidemia Father    Hypertension Father    Cancer Sister 55       ovarian   Depression Sister    Cancer Brother        CLL   Breast cancer Paternal Aunt        47's   Migraines Neg Hx    Thyroid  cancer Neg Hx    Social History   Socioeconomic History   Marital status: Married    Spouse name: jeffrey   Number of children: 1   Years of education: Not on file   Highest education level: 12th grade  Occupational History    Comment: disabled  Tobacco Use   Smoking status: Former    Current packs/day: 0.00    Average packs/day: 0.3 packs/day for 10.0 years (2.5 ttl pk-yrs)    Types: Cigarettes    Start date: 18    Quit date: 2005    Years since quitting: 20.4    Passive exposure: Past   Smokeless tobacco: Never   Tobacco comments:    smoked for 10 years, 1 pack every 2-3 days  Vaping Use   Vaping status: Never Used  Substance and Sexual Activity   Alcohol use: Yes    Alcohol/week: 1.0 standard drink of alcohol    Types: 1 Glasses of wine per week    Comment: margarita qd   Drug use: Yes    Types: Marijuana   Sexual activity:  Not Currently  Other Topics Concern   Not on file  Social History Narrative   Moved to [place] from [place], originally from [place].    1 cup of caffeine  daily   Right handed   Lives with husband, step-daughter and child       Social Drivers of Health   Financial Resource  Strain: Low Risk  (12/25/2023)   Overall Financial Resource Strain (CARDIA)    Difficulty of Paying Living Expenses: Not hard at all  Food Insecurity: No Food Insecurity (12/25/2023)   Hunger Vital Sign    Worried About Running Out of Food in the Last Year: Never true    Ran Out of Food in the Last Year: Never true  Transportation Needs: No Transportation Needs (12/25/2023)   PRAPARE - Administrator, Civil Service (Medical): No    Lack of Transportation (Non-Medical): No  Physical Activity: Inactive (12/25/2023)   Exercise Vital Sign    Days of Exercise per Week: 0 days    Minutes of Exercise per Session: 0 min  Stress: No Stress Concern Present (12/25/2023)   [employer] of Occupational Health - Occupational Stress Questionnaire    Feeling of Stress: Only a little  Social Connections: Moderately Integrated (12/25/2023)   Social Connection and Isolation Panel    Frequency of Communication with Friends and Family: More than three times a week    Frequency of Social Gatherings with Friends and Family: More than three times a week    Attends Religious Services: More than 4 times per year    Active Member of [employer] or Organizations: No    Attends Engineer, structural: Never    Marital Status: Married    Tobacco Counseling Counseling given: Not Answered Tobacco comments: smoked for 10 years, 1 pack every 2-3 days    Clinical Intake:  Pre-visit preparation completed: Yes  Pain : No/denies pain     BMI - recorded: 39.11 Nutritional Status: BMI > 30  Obese Nutritional Risks: Nausea/ vomitting/ diarrhea (nausea because of antibiotic) Diabetes: No  Lab Results  Component Value Date   HGBA1C 4.6 (L) 12/13/2022   HGBA1C 4.5 02/07/2022   HGBA1C 5.2 03/25/2020     How often do you need to have someone help you when you read instructions, pamphlets, or other written materials from your doctor or pharmacy?: 1 - Never  Interpreter Needed?: No  Information  entered by :: R. Alishba Naples LPN   Activities of Daily Living     12/25/2023    8:55 AM 10/16/2023    8:00 AM  In your present state of health, do you have any difficulty performing the following activities:  Hearing? 0 0  Vision? 0 0  Comment glasses   Difficulty concentrating or making decisions? 0 0  Walking or climbing stairs? 1   Dressing or bathing? 0   Doing errands, shopping? 0   Preparing Food and eating ? N   Using the Toilet? N   In the past six months, have you accidently leaked urine? N   Do you have problems with loss of bowel control? N   Managing your Medications? N   Managing your Finances? N   Housekeeping or managing your Housekeeping? N     Patient Care Team: Calista Catching, FNP as PCP - General (Family Medicine) Jerelene Monday Deadra Everts, MD as PCP - Cardiology (Cardiology) Boyce Byes, MD as PCP - Electrophysiology (Cardiology) Devorah Fonder, MD as Consulting Physician (  Cardiology) Burnie Cartwright, RN as Oncology Nurse Navigator Timmy Forbes, MD as Consulting Physician (Hematology and Oncology) Lawerence Pressman, MD as Consulting Physician (Urology) Glenis Langdon, MD as Consulting Physician (Radiation Oncology) Flynn Hylan, MD as Consulting Physician (General Surgery)  I have updated your Care Teams any recent Medical Services you may have received from other providers in the past year.     Assessment:   This is a routine wellness examination for Amber Barnes.  Hearing/Vision screen Hearing Screening - Comments:: No issues Vision Screening - Comments:: glasses   Goals Addressed             This Visit's Progress    Patient Stated       Wants to continue to get stronger and build herself back up like chair yoga, wants to walk without a cane       Depression Screen     12/25/2023    9:01 AM 11/30/2023    9:08 AM 08/29/2023   11:31 AM 07/14/2023    9:14 AM 06/16/2023   10:57 AM 03/14/2023    9:08 AM 03/14/2023    8:51 AM  PHQ 2/9 Scores   PHQ - 2 Score 0 6 0 0 0 1 0  PHQ- 9 Score 1 21    9  0    Fall Risk     12/25/2023    8:57 AM 12/01/2023    2:55 PM 11/30/2023    9:08 AM 11/23/2023   11:44 AM 11/16/2023   11:31 AM  Fall Risk   Falls in the past year? 1 1 0 1 1  Number falls in past yr: 1 0 0 0 0  Injury with Fall? 0 0 0 0 0  Risk for fall due to : History of fall(s);Impaired balance/gait Orthopedic patient No Fall Risks History of fall(s) History of fall(s)  Follow up Falls evaluation completed;Falls prevention discussed  Falls evaluation completed Falls evaluation completed Falls prevention discussed    MEDICARE RISK AT HOME:  Medicare Risk at Home Any stairs in or around the home?: No If so, are there any without handrails?: No Home free of loose throw rugs in walkways, pet beds, electrical cords, etc?: Yes Adequate lighting in your home to reduce risk of falls?: Yes Life alert?: No Use of a cane, walker or w/c?: Yes (cane) Grab bars in the bathroom?: Yes Shower chair or bench in shower?: Yes Elevated toilet seat or a handicapped toilet?: No  TIMED UP AND GO:  Was the test performed?  No  Cognitive Function: 6CIT completed        12/25/2023    9:07 AM 12/20/2022    9:45 AM 11/27/2019   11:53 AM  6CIT Screen  What Year? 0 points 0 points 0 points  What month? 0 points 0 points 0 points  What time? 0 points 0 points   Count back from 20 0 points 0 points   Months in reverse 0 points 0 points 0 points  Repeat phrase 0 points 0 points 0 points  Total Score 0 points 0 points     Immunizations Immunization History  Administered Date(s) Administered   Fluad Quad(high Dose 65+) 04/08/2019, 03/30/2020, 03/30/2022   Fluad Trivalent(High Dose 65+) 03/14/2023   Fluzone Influenza virus vaccine,trivalent (IIV3), split virus 03/23/2012, 05/20/2013   Hepatitis A, Adult 10/03/2012   Influenza Inj Mdck Quad Pf 04/03/2017   Influenza, High Dose Seasonal PF 03/05/2021   Influenza,inj,Quad PF,6+ Mos 05/08/2014,  05/15/2015, 04/14/2016, 03/30/2018   Influenza-Unspecified  03/30/2018, 03/07/2019   PFIZER Comirnaty(Gray Top)Covid-19 Tri-Sucrose Vaccine 01/06/2021, 04/07/2022   PFIZER(Purple Top)SARS-COV-2 Vaccination 08/23/2019, 09/18/2019, 06/02/2020   PNEUMOCOCCAL CONJUGATE-20 01/26/2021   Pfizer Covid-19 Vaccine Bivalent Booster 29yrs & up 04/07/2022   Pfizer(Comirnaty)Fall Seasonal Vaccine 12 years and older 04/07/2022, 04/20/2023   Pneumococcal Conjugate-13 03/30/2018   Pneumococcal Polysaccharide-23 04/18/2019   Pneumococcal-Unspecified 03/23/2012   Td 11/01/2012   Tdap 08/16/2012, 03/05/2021   Zoster Recombinant(Shingrix) 01/06/2021, 03/05/2021   Zoster, Live 10/07/2012, 12/07/2012    Screening Tests Health Maintenance  Topic Date Due   Colonoscopy  05/14/2023   COVID-19 Vaccine (7 - Pfizer risk 2024-25 season) 10/19/2023   Medicare Annual Wellness (AWV)  12/20/2023   INFLUENZA VACCINE  02/09/2024   MAMMOGRAM  12/27/2024   DTaP/Tdap/Td (4 - Td or Tdap) 03/06/2031   Pneumococcal Vaccine: 50+ Years  Completed   DEXA SCAN  Completed   Hepatitis C Screening  Completed   Zoster Vaccines- Shingrix  Completed   HPV VACCINES  Aged Out   Meningococcal B Vaccine  Aged Out    Health Maintenance  Health Maintenance Due  Topic Date Due   Colonoscopy  05/14/2023   COVID-19 Vaccine (7 - Pfizer risk 2024-25 season) 10/19/2023   Medicare Annual Wellness (AWV)  12/20/2023   Health Maintenance Items Addressed: DEXA ordered Discussed the need to update colonoscopy, patient is unable to do at this time because of recent surgery and infection.   Additional Screening:  Vision Screening: Recommended annual ophthalmology exams for early detection of glaucoma and other disorders of the eye.Up to date Amber Barnes Would you like a referral to an eye doctor? No    Dental Screening: Recommended annual dental exams for proper oral hygiene  Community Resource Referral / Chronic Care  Management: CRR required this visit?  No   CCM required this visit?  No   Plan:    I have personally reviewed and noted the following in the patient's chart:   Medical and social history Use of alcohol, tobacco or illicit drugs  Current medications and supplements including opioid prescriptions. Patient is currently taking opioid prescriptions. Information provided to patient regarding non-opioid alternatives. Patient advised to discuss non-opioid treatment plan with their provider. Functional ability and status Nutritional status Physical activity Advanced directives List of other physicians Hospitalizations, surgeries, and ER visits in previous 12 months Vitals Screenings to include cognitive, depression, and falls Referrals and appointments  In addition, I have reviewed and discussed with patient certain preventive protocols, quality metrics, and best practice recommendations. A written personalized care plan for preventive services as well as general preventive health recommendations were provided to patient.   Felicitas Horse, LPN   1/61/0960   After Visit Summary: (MyChart) Due to this being a telephonic visit, the after visit summary with patients personalized plan was offered to patient via MyChart   Notes: Nothing significant to report at this time.

## 2023-12-25 NOTE — Patient Instructions (Signed)
 Amber Barnes , Thank you for taking time out of your busy schedule to complete your Annual Wellness Visit with me. I enjoyed our conversation and look forward to speaking with you again next year. I, as well as your care team,  appreciate your ongoing commitment to your health goals. Please review the following plan we discussed and let me know if I can assist you in the future. Your Game plan/ To Do List    Referrals: If you haven't heard from the office you've been referred to, please reach out to them at the phone provided.  Dexa/Bone Density order placed today. Remember to call about your colonoscopy when you are able to.  You have an order for:  []   2D Mammogram  []   3D Mammogram  [x]   Bone Density     Please call for appointment:  [facility]  [address] [place] [address] [phone]    Make sure to wear two-piece clothing.  No lotions, powders, or deodorants the day of the appointment. Make sure to bring picture ID and insurance card.  Bring list of medications you are currently taking including any supplements.   Follow up Visits: Next Medicare AWV with our clinical staff: 12/25/24 @ 8:50   Have you seen your provider in the last 6 months (3 months if uncontrolled diabetes)? Yes Next Office Visit with your provider: 01/01/24  Clinician Recommendations:  Aim for 30 minutes of exercise or brisk walking, 6-8 glasses of water , and 5 servings of fruits and vegetables each day.       This is a list of the screening recommended for you and due dates:  Health Maintenance  Topic Date Due   Colon Cancer Screening  05/14/2023   COVID-19 Vaccine (7 - Pfizer risk 2024-25 season) 10/19/2023   Flu Shot  02/09/2024   Medicare Annual Wellness Visit  12/24/2024   Mammogram  12/27/2024   DTaP/Tdap/Td vaccine (4 - Td or Tdap) 03/06/2031   Pneumococcal Vaccine for age over 5  Completed   DEXA scan (bone density measurement)   Completed   Hepatitis C Screening  Completed   Zoster (Shingles) Vaccine  Completed   HPV Vaccine  Aged Out   Meningitis B Vaccine  Aged Out    Advanced directives: (Copy Requested) Please bring a copy of your health care power of attorney and living will to the office to be added to your chart at your convenience. You can mail to [facility] [address] [place], [place] [address] or email to [email or web address] Advance Care Planning is important because it:  [x]  Makes sure you receive the medical care that is consistent with your values, goals, and preferences  [x]  It provides guidance to your family and loved ones and reduces their decisional burden about whether or not they are making the right decisions based on your wishes. Managing Pain Without Opioids Opioids are strong medicines used to treat moderate to severe pain. For some people, especially those who have long-term (chronic) pain, opioids may not be the best choice for pain management due to: Side effects like nausea, constipation, and sleepiness. The risk of addiction (opioid use disorder). The longer you take opioids, the greater your risk of addiction. Pain that lasts for more than 3 months is called chronic pain. Managing chronic pain usually requires more than one approach and is often provided by a team of health care providers working together (multidisciplinary approach). Pain  management may be done at a pain management center or pain clinic. How to manage pain without the use of opioids Use non-opioid medicines Non-opioid medicines for pain may include: Over-the-counter or prescription non-steroidal anti-inflammatory drugs (NSAIDs). These may be the first medicines used for pain. They work well for muscle and bone pain, and they reduce swelling. Acetaminophen . This over-the-counter medicine may work well for milder pain but not swelling. Antidepressants. These may be used to treat chronic pain.  A certain type of antidepressant (tricyclics) is often used. These medicines are given in lower doses for pain than when used for depression. Anticonvulsants. These are usually used to treat seizures but may also reduce nerve (neuropathic) pain. Muscle relaxants. These relieve pain caused by sudden muscle tightening (spasms). You may also use a pain medicine that is applied to the skin as a patch, cream, or gel (topical analgesic), such as a numbing medicine. These may cause fewer side effects than medicines taken by mouth. Do certain therapies as directed Some therapies can help with pain management. They include: Physical therapy. You will do exercises to gain strength and flexibility. A physical therapist may teach you exercises to move and stretch parts of your body that are weak, stiff, or painful. You can learn these exercises at physical therapy visits and practice them at home. Physical therapy may also involve: Massage. Heat wraps or applying heat or cold to affected areas. Electrical signals that interrupt pain signals (transcutaneous electrical nerve stimulation, TENS). Weak lasers that reduce pain and swelling (low-level laser therapy). Signals from your body that help you learn to regulate pain (biofeedback). Occupational therapy. This helps you to learn ways to function at home and work with less pain. Recreational therapy. This involves trying new activities or hobbies, such as a physical activity or drawing. Mental health therapy, including: Cognitive behavioral therapy (CBT). This helps you learn coping skills for dealing with pain. Acceptance and commitment therapy (ACT) to change the way you think and react to pain. Relaxation therapies, including muscle relaxation exercises and mindfulness-based stress reduction. Pain management counseling. This may be individual, family, or group counseling.  Receive medical treatments Medical treatments for pain management include: Nerve  block injections. These may include a pain blocker and anti-inflammatory medicines. You may have injections: Near the spine to relieve chronic back or neck pain. Into joints to relieve back or joint pain. Into nerve areas that supply a painful area to relieve body pain. Into muscles (trigger point injections) to relieve some painful muscle conditions. A medical device placed near your spine to help block pain signals and relieve nerve pain or chronic back pain (spinal cord stimulation device). Acupuncture. Follow these instructions at home Medicines Take over-the-counter and prescription medicines only as told by your health care provider. If you are taking pain medicine, ask your health care providers about possible side effects to watch out for. Do not drive or use heavy machinery while taking prescription opioid pain medicine. Lifestyle  Do not use drugs or alcohol to reduce pain. If you drink alcohol, limit how much you have to: 0-1 drink a day for women who are not pregnant. 0-2 drinks a day for men. Know how much alcohol is in a drink. In the [place] one drink equals one 12 oz bottle of beer (355 mL), one 5 oz glass of wine (148 mL), or one 1 oz glass of hard liquor (44 mL). Do not use any products that contain nicotine or tobacco. These products include cigarettes, chewing tobacco,  and vaping devices, such as e-cigarettes. If you need help quitting, ask your health care provider. Eat a healthy diet and maintain a healthy weight. Poor diet and excess weight may make pain worse. Eat foods that are high in fiber. These include fresh fruits and vegetables, whole grains, and beans. Limit foods that are high in fat and processed sugars, such as fried and sweet foods. Exercise regularly. Exercise lowers stress and may help relieve pain. Ask your health care provider what activities and exercises are safe for you. If your health care provider approves, join an exercise class that combines  movement and stress reduction. Examples include yoga and tai chi. Get enough sleep. Lack of sleep may make pain worse. Lower stress as much as possible. Practice stress reduction techniques as told by your therapist. General instructions Work with all your pain management providers to find the treatments that work best for you. You are an important member of your pain management team. There are many things you can do to reduce pain on your own. Consider joining an online or in-person support group for people who have chronic pain. Keep all follow-up visits. This is important. Where to find more information You can find more information about managing pain without opioids from: American Academy of Pain Medicine: painmed.org Institute for Chronic Pain: instituteforchronicpain.org American Chronic Pain Association: theacpa.org Contact a health care provider if: You have side effects from pain medicine. Your pain gets worse or does not get better with treatments or home therapy. You are struggling with anxiety or depression. Summary Many types of pain can be managed without opioids. Chronic pain may respond better to pain management without opioids. Pain is best managed when you and a team of health care providers work together. Pain management without opioids may include non-opioid medicines, medical treatments, physical therapy, mental health therapy, and lifestyle changes. Tell your health care providers if your pain gets worse or is not being managed well enough. This information is not intended to replace advice given to you by your health care provider. Make sure you discuss any questions you have with your health care provider. Document Revised: 10/07/2020 Document Reviewed: 10/07/2020  Elsevier Patient Education  2024 [employer].

## 2023-12-26 ENCOUNTER — Other Ambulatory Visit: Payer: Self-pay

## 2023-12-29 ENCOUNTER — Ambulatory Visit
Admission: RE | Admit: 2023-12-29 | Discharge: 2023-12-29 | Disposition: A | Discharge status: Home / Self Care | Payer: Medicare Other | Source: Ambulatory Visit | Attending: Oncology | Admitting: Oncology

## 2023-12-29 ENCOUNTER — Ambulatory Visit
Admission: RE | Admit: 2023-12-29 | Discharge: 2023-12-29 | Disposition: A | Discharge status: Home / Self Care | Source: Ambulatory Visit | Attending: Oncology | Admitting: Oncology

## 2023-12-29 DIAGNOSIS — C50411 Malignant neoplasm of upper-outer quadrant of right female breast: Secondary | ICD-10-CM | POA: Insufficient documentation

## 2023-12-29 DIAGNOSIS — Z171 Estrogen receptor negative status [ER-]: Secondary | ICD-10-CM

## 2023-12-29 DIAGNOSIS — C50911 Malignant neoplasm of unspecified site of right female breast: Secondary | ICD-10-CM | POA: Diagnosis not present

## 2023-12-29 DIAGNOSIS — R92323 Mammographic fibroglandular density, bilateral breasts: Secondary | ICD-10-CM | POA: Diagnosis not present

## 2023-12-29 DIAGNOSIS — R921 Mammographic calcification found on diagnostic imaging of breast: Secondary | ICD-10-CM | POA: Diagnosis not present

## 2024-01-01 ENCOUNTER — Ambulatory Visit (INDEPENDENT_AMBULATORY_CARE_PROVIDER_SITE_OTHER): Admitting: Family

## 2024-01-01 VITALS — BP 136/86 | HR 64 | Temp 97.7°F | Ht 65.0 in | Wt 245.2 lb

## 2024-01-01 DIAGNOSIS — F32A Depression, unspecified: Secondary | ICD-10-CM | POA: Diagnosis not present

## 2024-01-01 DIAGNOSIS — F419 Anxiety disorder, unspecified: Secondary | ICD-10-CM

## 2024-01-01 NOTE — Progress Notes (Unsigned)
 Assessment & Plan:  There are no diagnoses linked to this encounter.   Return precautions given.   Risks, benefits, and alternatives of the medications and treatment plan prescribed today were discussed, and patient expressed understanding.   Education regarding symptom management and diagnosis given to patient on AVS either electronically or printed.  No follow-ups on file.  Amber Northern, FNP  Subjective:    Patient ID: Amber Barnes, female    DOB: Nov 13, 1950, 73 y.o.   MRN: [id number]  CC: Amber Barnes is a 73 y.o. female who presents today for follow up.   HPI: Follow up depression Compliant with increased dose of Lexapro  20 mg daily.  Xanax  0.25 mg once daily prn.  She doesn't use daily.   She may drink occasional alcohol however she doesn't drink alcohol when taking xanax .  She is feeling much better  She is 'doing more everyday' She had started a stretch therapy twice weekly      Contacted for counseling ; patient was not reached. She doesn't feel that she needs counseling at this time.   pending MRI breast BL for definitive characterization of interval increased mass right breast.   She remains on bactrim  Follow up with Dr Fayette next month  Allergies: Hydrocodone , Prednisone , Amiodarone , Hydrocodone -acetaminophen , Tapentadol, Adhesive [tape], Latex, Oxycodone , Tramadol , and Wound dressing adhesive Current Outpatient Medications on File Prior to Visit  Medication Sig Dispense Refill   acetaminophen  (TYLENOL ) 500 MG tablet Take 2 tablets (1,000 mg total) by mouth every 8 (eight) hours. 30 tablet 0   ALPRAZolam  (XANAX ) 0.25 MG tablet TAKE 1 TABLET BY MOUTH TWICE A DAY AS NEEDED FOR ANXIETY 60 tablet 2   bisoprolol  (ZEBETA ) 10 MG tablet TAKE 1.5 TABLETS BY MOUTH 2 TIMES DAILY. 270 tablet 3   busPIRone  (BUSPAR ) 5 MG tablet Take 1 tablet (5 mg total) by mouth 3 (three) times daily as needed. 60 tablet 1   Calcium  Carbonate (CALCIUM  500 PO) Take 1  tablet by mouth daily.     Cholecalciferol  (VITAMIN D3) 5000 units CAPS Take 5,000 Units by mouth daily.     docusate sodium  (COLACE) 100 MG capsule Take 1 capsule (100 mg total) by mouth 2 (two) times daily. 10 capsule 0   ELIQUIS  5 MG TABS tablet TAKE 1 TABLET BY MOUTH TWICE A DAY 180 tablet 1   escitalopram  (LEXAPRO ) 20 MG tablet Take 1 tablet (20 mg total) by mouth daily. 90 tablet 3   famotidine  (PEPCID ) 20 MG tablet Take 1 tablet (20 mg total) by mouth 2 (two) times daily. 60 tablet 0   furosemide  (LASIX ) 20 MG tablet Take 1 tablet (20 mg total) by mouth daily. May take additional 20mg  PO prn for leg swelling 180 tablet 3   KLOR-CON  M20 20 MEQ tablet TAKE 1 TABLET BY MOUTH EVERY DAY 90 tablet 3   losartan -hydrochlorothiazide  (HYZAAR) 50-12.5 MG tablet Take 0.5 tablets by mouth daily as needed (for BP 140/80). 30 tablet 1   metoCLOPramide  (REGLAN ) 10 MG tablet Take 1 tablet (10 mg total) by mouth every 6 (six) hours as needed. 30 tablet 0   Multiple Vitamin (MULTIVITAMIN) tablet Take 1 tablet by mouth daily.     ondansetron  (ZOFRAN ) 4 MG tablet Take 1 tablet (4 mg total) by mouth every 6 (six) hours as needed for nausea. 20 tablet 0   ranolazine  (RANEXA ) 500 MG 12 hr tablet TAKE 1 TABLET BY MOUTH TWICE A DAY 180 tablet 3   rOPINIRole  (REQUIP ) 2 MG tablet TAKE  1 TABLET BY MOUTH EVERYDAY AT BEDTIME 90 tablet 3   [Paused] rosuvastatin  (CRESTOR ) 40 MG tablet TAKE 1 TABLET BY MOUTH EVERY DAY 90 tablet 3   sulfamethoxazole -trimethoprim  (BACTRIM  DS) 800-160 MG tablet Take 1 tablet by mouth 2 (two) times daily. 60 tablet 1   traMADol  (ULTRAM ) 50 MG tablet Take 1-2 tablets (50-100 mg total) by mouth every 6 (six) hours as needed for moderate pain (pain score 4-6). 30 tablet 0   [DISCONTINUED] prochlorperazine  (COMPAZINE ) 10 MG tablet Take 1 tablet (10 mg total) by mouth every 6 (six) hours as needed (Nausea or vomiting). 30 tablet 1   No current facility-administered medications on file prior to  visit.    Review of Systems    Objective:    BP 136/86   Pulse 64   Temp 97.7 F (36.5 C) (Oral)   Ht 5' 5 (1.651 m)   Wt 245 lb 3.2 oz (111.2 kg)   SpO2 96%   BMI 40.80 kg/m  BP Readings from Last 3 Encounters:  01/01/24 136/86  11/30/23 (!) 146/86  11/14/23 (!) 140/82   Wt Readings from Last 3 Encounters:  01/01/24 245 lb 3.2 oz (111.2 kg)  12/25/23 235 lb (106.6 kg)  12/01/23 237 lb (107.5 kg)    Physical Exam

## 2024-01-02 ENCOUNTER — Telehealth: Payer: Self-pay | Admitting: *Deleted

## 2024-01-02 ENCOUNTER — Encounter: Payer: Self-pay | Admitting: Oncology

## 2024-01-02 ENCOUNTER — Other Ambulatory Visit: Payer: Self-pay

## 2024-01-02 ENCOUNTER — Encounter: Payer: Self-pay | Admitting: Family

## 2024-01-02 DIAGNOSIS — C50411 Malignant neoplasm of upper-outer quadrant of right female breast: Secondary | ICD-10-CM

## 2024-01-02 MED ORDER — ALPRAZOLAM 0.25 MG PO TABS: 0.2500 mg | ORAL_TABLET | Freq: Every day | ORAL | Status: DC | PRN

## 2024-01-02 NOTE — Telephone Encounter (Signed)
 Patient called cancer center to see if her MRI has been scheduled. I returned call to patient. I explained that per Dr. Layvonne team, the MRI order has been placed. She will be receiving a call from the MRI dept to get this scheduled.

## 2024-01-02 NOTE — Assessment & Plan Note (Addendum)
 Significantly improved.  Patient does not feel like counseling is needed at this time.  Discussed long-term risk of benzodiazepines as a relates to memory changes, addiction and sedation.  She understands not to take any other sedating substance while taking Xanax .  She does not  drink alcohol when she takes Xanax . Continue Lexapro  20 mg daily, Xanax  0.25 mg daily as needed ( changed to reflect how she is taking).  Controlled substance Contract signed today

## 2024-01-02 NOTE — Telephone Encounter (Signed)
Please see mammogram results.

## 2024-01-09 ENCOUNTER — Ambulatory Visit: Payer: Self-pay | Admitting: Oncology

## 2024-01-09 ENCOUNTER — Ambulatory Visit
Admission: RE | Admit: 2024-01-09 | Discharge: 2024-01-09 | Disposition: A | Discharge status: Home / Self Care | Source: Ambulatory Visit | Attending: Oncology | Admitting: Oncology

## 2024-01-09 ENCOUNTER — Other Ambulatory Visit: Payer: Self-pay | Admitting: Oncology

## 2024-01-09 DIAGNOSIS — N6312 Unspecified lump in the right breast, upper inner quadrant: Secondary | ICD-10-CM | POA: Diagnosis not present

## 2024-01-09 DIAGNOSIS — Z171 Estrogen receptor negative status [ER-]: Secondary | ICD-10-CM | POA: Insufficient documentation

## 2024-01-09 DIAGNOSIS — C50411 Malignant neoplasm of upper-outer quadrant of right female breast: Secondary | ICD-10-CM | POA: Insufficient documentation

## 2024-01-09 DIAGNOSIS — N6311 Unspecified lump in the right breast, upper outer quadrant: Secondary | ICD-10-CM | POA: Diagnosis not present

## 2024-01-09 DIAGNOSIS — R928 Other abnormal and inconclusive findings on diagnostic imaging of breast: Secondary | ICD-10-CM

## 2024-01-09 MED ORDER — GADOBUTROL 1 MMOL/ML IV SOLN
10.0000 mL | Freq: Once | INTRAVENOUS | Status: AC | PRN
  Administered 2024-01-09: 10 mL via INTRAVENOUS

## 2024-01-11 ENCOUNTER — Ambulatory Visit
Admission: RE | Admit: 2024-01-11 | Discharge: 2024-01-11 | Disposition: A | Discharge status: Home / Self Care | Source: Ambulatory Visit | Attending: Oncology | Admitting: Oncology

## 2024-01-11 ENCOUNTER — Ambulatory Visit
Admission: RE | Admit: 2024-01-11 | Discharge: 2024-01-11 | Disposition: A | Discharge status: Home / Self Care | Source: Ambulatory Visit | Attending: Family | Admitting: Family

## 2024-01-11 ENCOUNTER — Encounter: Payer: Self-pay | Admitting: Oncology

## 2024-01-11 DIAGNOSIS — R928 Other abnormal and inconclusive findings on diagnostic imaging of breast: Secondary | ICD-10-CM

## 2024-01-11 DIAGNOSIS — Z780 Asymptomatic menopausal state: Secondary | ICD-10-CM | POA: Diagnosis not present

## 2024-01-11 DIAGNOSIS — N6312 Unspecified lump in the right breast, upper inner quadrant: Secondary | ICD-10-CM | POA: Diagnosis not present

## 2024-01-11 DIAGNOSIS — N6311 Unspecified lump in the right breast, upper outer quadrant: Secondary | ICD-10-CM | POA: Diagnosis not present

## 2024-01-11 DIAGNOSIS — N6011 Diffuse cystic mastopathy of right breast: Secondary | ICD-10-CM | POA: Diagnosis not present

## 2024-01-11 DIAGNOSIS — N641 Fat necrosis of breast: Secondary | ICD-10-CM | POA: Diagnosis not present

## 2024-01-11 HISTORY — PX: BREAST BIOPSY: SHX20

## 2024-01-11 MED ORDER — LIDOCAINE-EPINEPHRINE 1 %-1:100000 IJ SOLN
8.0000 mL | Freq: Once | INTRAMUSCULAR | Status: AC
  Administered 2024-01-11: 8 mL

## 2024-01-11 MED ORDER — LIDOCAINE 1 % OPTIME INJ - NO CHARGE
2.0000 mL | Freq: Once | INTRAMUSCULAR | Status: AC
Start: 2024-01-11 — End: 2024-01-11
  Administered 2024-01-11: 2 mL
  Filled 2024-01-11: qty 2

## 2024-01-11 MED ORDER — LIDOCAINE-EPINEPHRINE 1 %-1:100000 IJ SOLN
8.0000 mL | Freq: Once | INTRAMUSCULAR | Status: AC
Start: 2024-01-11 — End: 2024-01-11
  Administered 2024-01-11: 8 mL

## 2024-01-11 MED ORDER — LIDOCAINE 1 % OPTIME INJ - NO CHARGE
2.0000 mL | Freq: Once | INTRAMUSCULAR | Status: AC
  Administered 2024-01-11: 2 mL
  Filled 2024-01-11: qty 2

## 2024-01-11 NOTE — Telephone Encounter (Signed)
 Pt had biopsy today (7/3)

## 2024-01-11 NOTE — Telephone Encounter (Signed)
-----   Message from Zelphia Cap sent at 01/09/2024 10:27 PM EDT ----- Please arrange patient to have ultrasound-guided breast lesion biopsy as per recommended by radiology based on MRI results.  Thank you ----- Message ----- From: Interface, Rad Results In Sent: 01/09/2024  12:43 PM EDT To: Zelphia Cap, MD

## 2024-01-12 ENCOUNTER — Other Ambulatory Visit: Payer: Self-pay | Admitting: Infectious Diseases

## 2024-01-15 LAB — SURGICAL PATHOLOGY

## 2024-01-16 ENCOUNTER — Ambulatory Visit: Payer: Self-pay | Admitting: Family

## 2024-01-18 ENCOUNTER — Ambulatory Visit: Attending: Infectious Diseases | Admitting: Infectious Diseases

## 2024-01-18 ENCOUNTER — Other Ambulatory Visit
Admission: RE | Admit: 2024-01-18 | Discharge: 2024-01-18 | Disposition: A | Discharge status: Home / Self Care | Source: Ambulatory Visit | Attending: Infectious Diseases | Admitting: Infectious Diseases

## 2024-01-18 ENCOUNTER — Encounter: Payer: Self-pay | Admitting: Infectious Diseases

## 2024-01-18 VITALS — BP 145/76 | HR 54 | Temp 97.0°F | Ht 65.0 in | Wt 249.0 lb

## 2024-01-18 DIAGNOSIS — Z853 Personal history of malignant neoplasm of breast: Secondary | ICD-10-CM | POA: Diagnosis not present

## 2024-01-18 DIAGNOSIS — B957 Other staphylococcus as the cause of diseases classified elsewhere: Secondary | ICD-10-CM | POA: Diagnosis not present

## 2024-01-18 DIAGNOSIS — Z87891 Personal history of nicotine dependence: Secondary | ICD-10-CM | POA: Insufficient documentation

## 2024-01-18 DIAGNOSIS — Z7901 Long term (current) use of anticoagulants: Secondary | ICD-10-CM | POA: Insufficient documentation

## 2024-01-18 DIAGNOSIS — Z79899 Other long term (current) drug therapy: Secondary | ICD-10-CM | POA: Insufficient documentation

## 2024-01-18 DIAGNOSIS — N182 Chronic kidney disease, stage 2 (mild): Secondary | ICD-10-CM | POA: Insufficient documentation

## 2024-01-18 DIAGNOSIS — T8452XD Infection and inflammatory reaction due to internal left hip prosthesis, subsequent encounter: Secondary | ICD-10-CM | POA: Diagnosis not present

## 2024-01-18 DIAGNOSIS — Z96643 Presence of artificial hip joint, bilateral: Secondary | ICD-10-CM | POA: Diagnosis not present

## 2024-01-18 DIAGNOSIS — I5022 Chronic systolic (congestive) heart failure: Secondary | ICD-10-CM | POA: Insufficient documentation

## 2024-01-18 DIAGNOSIS — G2581 Restless legs syndrome: Secondary | ICD-10-CM | POA: Diagnosis not present

## 2024-01-18 DIAGNOSIS — B958 Unspecified staphylococcus as the cause of diseases classified elsewhere: Secondary | ICD-10-CM | POA: Diagnosis not present

## 2024-01-18 DIAGNOSIS — I4891 Unspecified atrial fibrillation: Secondary | ICD-10-CM | POA: Insufficient documentation

## 2024-01-18 DIAGNOSIS — I130 Hypertensive heart and chronic kidney disease with heart failure and stage 1 through stage 4 chronic kidney disease, or unspecified chronic kidney disease: Secondary | ICD-10-CM | POA: Insufficient documentation

## 2024-01-18 DIAGNOSIS — Y792 Prosthetic and other implants, materials and accessory orthopedic devices associated with adverse incidents: Secondary | ICD-10-CM | POA: Insufficient documentation

## 2024-01-18 LAB — CBC WITH DIFFERENTIAL/PLATELET
Abs Immature Granulocytes: 0.04 K/uL (ref 0.00–0.07)
Basophils Absolute: 0 K/uL (ref 0.0–0.1)
Basophils Relative: 1 %
Eosinophils Absolute: 0.1 K/uL (ref 0.0–0.5)
Eosinophils Relative: 1 %
HCT: 34.3 % — ABNORMAL LOW (ref 36.0–46.0)
Hemoglobin: 11.6 g/dL — ABNORMAL LOW (ref 12.0–15.0)
Immature Granulocytes: 1 %
Lymphocytes Relative: 21 %
Lymphs Abs: 0.9 K/uL (ref 0.7–4.0)
MCH: 30.2 pg (ref 26.0–34.0)
MCHC: 33.8 g/dL (ref 30.0–36.0)
MCV: 89.3 fL (ref 80.0–100.0)
Monocytes Absolute: 0.3 K/uL (ref 0.1–1.0)
Monocytes Relative: 7 %
Neutro Abs: 2.9 K/uL (ref 1.7–7.7)
Neutrophils Relative %: 69 %
Platelets: 165 K/uL (ref 150–400)
RBC: 3.84 MIL/uL — ABNORMAL LOW (ref 3.87–5.11)
RDW: 14.8 % (ref 11.5–15.5)
WBC: 4.2 K/uL (ref 4.0–10.5)
nRBC: 0 % (ref 0.0–0.2)

## 2024-01-18 LAB — COMPREHENSIVE METABOLIC PANEL WITH GFR
ALT: 13 U/L (ref 0–44)
AST: 15 U/L (ref 15–41)
Albumin: 3.5 g/dL (ref 3.5–5.0)
Alkaline Phosphatase: 70 U/L (ref 38–126)
Anion gap: 10 (ref 5–15)
BUN: 22 mg/dL (ref 8–23)
CO2: 23 mmol/L (ref 22–32)
Calcium: 8.9 mg/dL (ref 8.9–10.3)
Chloride: 102 mmol/L (ref 98–111)
Creatinine, Ser: 0.79 mg/dL (ref 0.44–1.00)
GFR, Estimated: >60 mL/min (ref >60)
Glucose, Bld: 97 mg/dL (ref 70–99)
Potassium: 4.2 mmol/L (ref 3.5–5.1)
Sodium: 135 mmol/L (ref 135–145)
Total Bilirubin: 0.5 mg/dL (ref 0.0–1.2)
Total Protein: 6.8 g/dL (ref 6.5–8.1)

## 2024-01-18 LAB — SEDIMENTATION RATE: Sed Rate: 33 mm/h — ABNORMAL HIGH (ref 0–30)

## 2024-01-18 LAB — C-REACTIVE PROTEIN: CRP: 0.7 mg/dL (ref <1.0)

## 2024-01-18 NOTE — Progress Notes (Signed)
 NAME: Amber Barnes  DOB: 12/10/1950  MRN: [id number]  Date/Time: 01/18/2024 11:01 AM   Subjective:    ?Follow up visit  for left hip Pji due to staph lugdunensis  Amber Barnes is a 73 y.o. with a history of rt Ca breast, b/l TKA. Left THA, afib , cardiac ablation and  a recent left hip prosthetic joint infection  presents for follow-up . She is on  IV daptomycin  for Staphylococcus lugdunensis. On April 8th,2025 she had DAIR - wash out, the liner and head were changed, and antibiotic beads were placed. Staph lugdunesis in ulture- completed 6 weeks of IV daptoimycin and has been on Bactrim  since then- 12 weeks of antibiotic so far No pain hip-  She has not been taking any pain meds Does lot of water  aerobics Had breast biopsy on 7/3 due to abnormal mammogram and only fat necrosis  Her past medical history includes bilateral hip replacements, with the most recent hip surgery in October, and a knee replacement on March 3rd.2025  She has a history of atrial fibrillation for which she takes Eliquis . She also takes vitamin D3, Requip  for restless leg syndrome, Xanax  as needed, Buspar , Lexapro , bisoprolol , and Hyzaar as needed for blood pressure spikes.   Past Medical History:  Diagnosis Date   Achilles tendinitis    Acute medial meniscal tear    Allergy    Anemia    Anxiety    a.) on BZO PRN (alprazolam )   Aortic atherosclerosis (HCC)    Ascending aorta dilatation (HCC) 10/20/2022   a.) TTE 10/20/2022: asc Ao 39 mm   Atrial fibrillation/flutter (HCC) 07/2016   a.) Dx'd 07/2016; b.) CHA2DS2-VASc = 5 (age, sex, HFmrEF, HTN, vasc disease history) as of 05/09/2023; c.) s/p DCCV 09/07/2016 (150 J x 2 --> A.flutter; 200 J x 1 --> NSR); d.) s/p DCCV 10/19/2016 (150 J x 1 --> NSR); e.) brought in for DCCV 12/2016; cancelled d/t NSR; f.) s/p PVI ablation 12/12/2022; g.) cardiac rate/rhythm maintained on oral bisoprolol ; chronically anticoagulated using apixaban    BMI 50.0-59.9, adult (HCC)  07/16/2018   CAD (coronary artery disease)    a.) cCTA 12/08/2022: Ca2+ = 31.6 (58th %'ile for age/sex/race match control)   Cardiomegaly    Chemotherapy induced nausea and vomiting 05/18/2021   Chronic heart failure with mid-range ejection fraction (HFmrEF) (HCC)    a.) TTE 03/08/21: EF 60-65, mild LVH, G1DD, mild LAE, triv MR; b.) TTE 07/29/21: EF 60-65, G1DD, mild LAE, triv MR; c.) TTE 11/26/21: EF 55-60, G1DD, mild LAE, mild MR; d.) TTE 02/25/22: EF 60-65, G1DD, mild LAE, mild MR; e.) TTE 05/31/22: EF 55-60, mild LVH, G2DD, mild-mod MR; f.) TTE 10/20/22: EF 45-50, mild LAE, mod red RVSF, mild-mod TR, Asc Ao 39; g.) TTE 12/13/22: EF 50-55, mild LVH, mild MAC   CKD (chronic kidney disease), stage II    Colon polyp    Complication of anesthesia    a.) emergence delirium; b.) hypoxia after hip replacement   COPD (chronic obstructive pulmonary disease) (HCC)    DDD (degenerative disc disease), lumbar    a.) s/p fusion   Depression    Endometriosis    Family history of CLL (chronic lymphoid leukemia) 03/21/2020   Fibrocystic breast disease    GERD (gastroesophageal reflux disease)    History of kidney stones    History of stress test    a.) MV 07/29/2016: EF 55-65%. Small, mild defect  in mid anteroseptal, apical septal, and apical locations. No ischemia-->low risk; b.) MV  10/2022: mod sized partially rev apical ant, apical septal, and apical defect. EF 52%. No signific cor Ca2+; c.) cCTA 12/08/2022: Ca2+ = 31.6 (58th %'ile).   History of tobacco use    Hyperlipidemia    Hypertension    Hypothyroidism    a.) s/p partial thyroidectomy for nodule; pathology benign   Insomnia    Left knee pain, unspecified chronicity    Lipoma    Malignant neoplasm of upper-outer quadrant of right female breast (HCC) 03/03/2021   a.) stage 1A invasive mammary carcinoma (cT1b or T1c N0); ER (-), PR (+), HER2/neu (+); did not tolerate adjuvant chemotherapy (paclitacel + trastuzumab ) despite paclitaxel  dose reduction;  s/p adjuvant XRT; cinished maintenance trastuzumab  07/27/2022; no adjuvant endocrine therapy as no clear benefit   Migraine    Nephrolithiasis    On apixaban  therapy    OSA (obstructive sleep apnea)    a.) non-compliant with nocturnal PAP therapy   Osteoarthritis    Osteopenia    PAF (paroxysmal atrial fibrillation) (HCC) 12/13/2022   Peptic ulcer disease    a.) H. pylori   Pericarditis 12/12/2022   a.) following A.fib ablation; resolved   Pneumonia    Port-A-Cath in place    Primary localized osteoarthritis of pelvic region and thigh 03/02/2022   RLS (restless legs syndrome)    a.) on ropinirole    S/P TKR (total knee replacement), left 09/11/2023   S/P total left hip arthroplasty 05/11/2023   Vertigo 06/08/2016   Vitamin D  deficiency     Past Surgical History:  Procedure Laterality Date   ABDOMINAL HYSTERECTOMY     ovaries left   ACHILLES TENDON SURGERY     2012,01/2017   ATRIAL FIBRILLATION ABLATION N/A 12/12/2022   Procedure: ATRIAL FIBRILLATION ABLATION;  Surgeon: Cindie Ole DASEN, MD;  Location: [facility];  Service: Cardiovascular;  Laterality: N/A;   BREAST BIOPSY Right 01/15/2021   Affirm biopsy-X clip-INVASIVE MAMMARY CARCINOMA   BREAST BIOPSY Right 01/15/2021   u/s bx-venus clip INVASIVE MAMMARY CARCINOMA   BREAST BIOPSY Right 01/11/2024   us  bx x 2. 10:00 13cfm venus marker, 1:00 9 cmfn ribbon marker, paths pending   BREAST BIOPSY Right 01/04/2023   u/s bx ribbon clip path pending   BREAST BIOPSY Right 01/04/2023   US  RT BREAST BX W LOC DEV 1ST LESION IMG BX SPEC US  GUIDE 01/04/2023 [facility]   BREAST BIOPSY Right 01/11/2024   US  RT BREAST BX W LOC DEV EA ADD LESION IMG BX SPEC US  GUIDE 01/11/2024 [facility]   BREAST BIOPSY Right 01/11/2024   US  RT BREAST BX W LOC DEV 1ST LESION IMG BX SPEC US  GUIDE 01/11/2024 [facility]   BREAST LUMPECTOMY,RADIO FREQ LOCALIZER,AXILLARY SENTINEL LYMPH NODE BIOPSY Right 02/17/2021   Procedure: BREAST  LUMPECTOMY,RADIO FREQ LOCALIZER,AXILLARY SENTINEL LYMPH NODE BIOPSY;  Surgeon: Lane Shope, MD;  Location: [facility];  Service: General;  Laterality: Right;   CARDIOVERSION N/A 09/07/2016   Procedure: CARDIOVERSION;  Surgeon: Evalene JINNY Lunger, MD;  Location: [facility];  Service: Cardiovascular;  Laterality: N/A;   CARDIOVERSION N/A 10/19/2016   Procedure: CARDIOVERSION;  Surgeon: Evalene JINNY Lunger, MD;  Location: [facility];  Service: Cardiovascular;  Laterality: N/A;   CARDIOVERSION N/A 12/14/2016   Procedure: CARDIOVERSION;  Surgeon: Lunger Evalene JINNY, MD;  Location: [facility];  Service: Cardiovascular;  Laterality: N/A;   COLONOSCOPY     COLONOSCOPY WITH PROPOFOL  N/A 05/13/2020   Procedure: COLONOSCOPY WITH PROPOFOL ;  Surgeon: Toledo, Ladell POUR, MD;  Location: [facility];  Service: Gastroenterology;  Laterality: N/A;  ELIQUIS    CYSTOSCOPY WITH STENT PLACEMENT Bilateral 07/12/2021   Procedure: CYSTOSCOPY WITH STENT PLACEMENT;  Surgeon: Francisca Redell BROCKS, MD;  Location: [facility];  Service: Urology;  Laterality: Bilateral;   CYSTOSCOPY/URETEROSCOPY/HOLMIUM LASER/STENT PLACEMENT Bilateral 07/30/2021   Procedure: CYSTOSCOPY/URETEROSCOPY/HOLMIUM LASER/BILATERAL STENT REMOVAL;  Surgeon: Francisca Redell BROCKS, MD;  Location: [facility];  Service: Urology;  Laterality: Bilateral;   ESOPHAGOGASTRODUODENOSCOPY     ESOPHAGOGASTRODUODENOSCOPY (EGD) WITH PROPOFOL  N/A 05/13/2020   Procedure: ESOPHAGOGASTRODUODENOSCOPY (EGD) WITH PROPOFOL ;  Surgeon: Toledo, Ladell POUR, MD;  Location: [facility];  Service: Gastroenterology;  Laterality: N/A;   EXCISIONAL TOTAL HIP ARTHROPLASTY WITH ANTIBIOTIC SPACERS Left 10/17/2023   Procedure: EXCISIONAL TOTAL HIP ARTHROPLASTY WITH PLACEMENT OF ANTIBIOTIC BEADS AND DRAIN;  Surgeon: Lorelle Hussar, MD;  Location: [facility];  Service: Orthopedics;  Laterality: Left;   FOOT SURGERY Left    tendon   LUMBAR FUSION     PORTA CATH INSERTION N/A 03/23/2021   Procedure: PORTA CATH  INSERTION;  Surgeon: Jama Cordella MATSU, MD;  Location: [facility];  Service: Cardiovascular;  Laterality: N/A;   PORTACATH PLACEMENT N/A 03/23/2021   Procedure: ATTEMPTED INSERTION PORT-A-CATH;  Surgeon: Lane Shope, MD;  Location: [facility];  Service: General;  Laterality: N/A;   THYROIDECTOMY  1990   half thyroid  lobectomy secondary to a benign nodule   TOTAL HIP ARTHROPLASTY Right 08/21/2018   Procedure: TOTAL HIP ARTHROPLASTY ANTERIOR APPROACH-RIGHT CELL SAVER REQUESTED;  Surgeon: Kathlynn Sharper, MD;  Location: [facility];  Service: Orthopedics;  Laterality: Right;   TOTAL HIP ARTHROPLASTY Left 05/11/2023   Procedure: TOTAL HIP ARTHROPLASTY ANTERIOR APPROACH;  Surgeon: Lorelle Hussar, MD;  Location: [facility];  Service: Orthopedics;  Laterality: Left;   TOTAL KNEE ARTHROPLASTY Left 09/11/2023   Procedure: ARTHROPLASTY, KNEE, TOTAL;  Surgeon: Lorelle Hussar, MD;  Location: [facility];  Service: Orthopedics;  Laterality: Left;    Social History   Socioeconomic History   Marital status: Married    Spouse name: jeffrey   Number of children: 1   Years of education: Not on file   Highest education level: 12th grade  Occupational History    Comment: disabled  Tobacco Use   Smoking status: Former    Current packs/day: 0.00    Average packs/day: 0.3 packs/day for 10.0 years (2.5 ttl pk-yrs)    Types: Cigarettes    Start date: 5    Quit date: 2005    Years since quitting: 20.5    Passive exposure: Past   Smokeless tobacco: Never   Tobacco comments:    smoked for 10 years, 1 pack every 2-3 days  Vaping Use   Vaping status: Never Used  Substance and Sexual Activity   Alcohol use: Yes    Alcohol/week: 1.0 standard drink of alcohol    Types: 1 Glasses of wine per week    Comment: margarita qd   Drug use: Yes    Types: Marijuana   Sexual activity: Not Currently  Other Topics Concern   Not on file  Social History Narrative   Moved to [place] from [place],  originally from [place].    1 cup of caffeine  daily   Right handed   Lives with husband, step-daughter and child       Social Drivers of Health   Financial Resource Strain: Low Risk  (01/01/2024)   Overall Financial Resource Strain (CARDIA)    Difficulty of Paying Living Expenses: Not hard at all  Food Insecurity: No Food Insecurity (01/01/2024)  Hunger Vital Sign    Worried About Running Out of Food in the Last Year: Never true    Ran Out of Food in the Last Year: Never true  Transportation Needs: No Transportation Needs (01/01/2024)   PRAPARE - Administrator, Civil Service (Medical): No    Lack of Transportation (Non-Medical): No  Physical Activity: Insufficiently Active (01/01/2024)   Exercise Vital Sign    Days of Exercise per Week: 3 days    Minutes of Exercise per Session: 30 min  Stress: Stress Concern Present (01/01/2024)   [employer] of Occupational Health - Occupational Stress Questionnaire    Feeling of Stress: Rather much  Social Connections: Moderately Integrated (01/01/2024)   Social Connection and Isolation Panel    Frequency of Communication with Friends and Family: More than three times a week    Frequency of Social Gatherings with Friends and Family: More than three times a week    Attends Religious Services: More than 4 times per year    Active Member of [employer] or Organizations: No    Attends Banker Meetings: Not on file    Marital Status: Married  Catering manager Violence: Not At Risk (12/25/2023)   Humiliation, Afraid, Rape, and Kick questionnaire    Fear of Current or Ex-Partner: No    Emotionally Abused: No    Physically Abused: No    Sexually Abused: No    Family History  Problem Relation Age of Onset   Cancer Mother        leukemia   Cancer Father 15       colon   Heart disease Father    Heart attack Father 60       died from MI   Hyperlipidemia Father    Hypertension Father    Cancer Sister 39        ovarian   Depression Sister    Cancer Brother        CLL   Breast cancer Paternal Aunt        40's   Migraines Neg Hx    Thyroid  cancer Neg Hx    Allergies  Allergen Reactions   Hydrocodone     Prednisone  Other (See Comments)    Agitation, hyperactivity, polyphagia   Amiodarone  Nausea And Vomiting and Other (See Comments)   Hydrocodone -Acetaminophen  Other (See Comments)    Hallucinations and combative   Tapentadol Itching   Adhesive [Tape] Itching and Rash    Prefers paper tape   Latex Itching and Rash    use paper tap   Oxycodone  Itching    Can take it with benadryl    Tramadol  Itching    Can take it with benadryl    Wound Dressing Adhesive    I? Current Outpatient Medications  Medication Sig Dispense Refill   acetaminophen  (TYLENOL ) 500 MG tablet Take 2 tablets (1,000 mg total) by mouth every 8 (eight) hours. 30 tablet 0   ALPRAZolam  (XANAX ) 0.25 MG tablet Take 1 tablet (0.25 mg total) by mouth daily as needed for anxiety.     bisoprolol  (ZEBETA ) 10 MG tablet TAKE 1.5 TABLETS BY MOUTH 2 TIMES DAILY. 270 tablet 3   busPIRone  (BUSPAR ) 5 MG tablet Take 1 tablet (5 mg total) by mouth 3 (three) times daily as needed. 60 tablet 1   Calcium  Carbonate (CALCIUM  500 PO) Take 1 tablet by mouth daily.     Cholecalciferol  (VITAMIN D3) 5000 units CAPS Take 5,000 Units by mouth daily.     docusate  sodium (COLACE) 100 MG capsule Take 1 capsule (100 mg total) by mouth 2 (two) times daily. 10 capsule 0   ELIQUIS  5 MG TABS tablet TAKE 1 TABLET BY MOUTH TWICE A DAY 180 tablet 1   escitalopram  (LEXAPRO ) 20 MG tablet Take 1 tablet (20 mg total) by mouth daily. 90 tablet 3   famotidine  (PEPCID ) 20 MG tablet Take 1 tablet (20 mg total) by mouth 2 (two) times daily. 60 tablet 0   furosemide  (LASIX ) 20 MG tablet Take 1 tablet (20 mg total) by mouth daily. May take additional 20mg  PO prn for leg swelling 180 tablet 3   KLOR-CON  M20 20 MEQ tablet TAKE 1 TABLET BY MOUTH EVERY DAY 90 tablet 3    losartan -hydrochlorothiazide  (HYZAAR) 50-12.5 MG tablet Take 0.5 tablets by mouth daily as needed (for BP 140/80). 30 tablet 1   metoCLOPramide  (REGLAN ) 10 MG tablet Take 1 tablet (10 mg total) by mouth every 6 (six) hours as needed. 30 tablet 0   Multiple Vitamin (MULTIVITAMIN) tablet Take 1 tablet by mouth daily.     ondansetron  (ZOFRAN ) 4 MG tablet Take 1 tablet (4 mg total) by mouth every 6 (six) hours as needed for nausea. 20 tablet 0   ranolazine  (RANEXA ) 500 MG 12 hr tablet TAKE 1 TABLET BY MOUTH TWICE A DAY 180 tablet 3   rOPINIRole  (REQUIP ) 2 MG tablet TAKE 1 TABLET BY MOUTH EVERYDAY AT BEDTIME 90 tablet 3   rosuvastatin  (CRESTOR ) 40 MG tablet TAKE 1 TABLET BY MOUTH EVERY DAY 90 tablet 3   sulfamethoxazole -trimethoprim  (BACTRIM  DS) 800-160 MG tablet TAKE 1 TABLET BY MOUTH TWICE A DAY 60 tablet 1   No current facility-administered medications for this visit.     Abtx:  Anti-infectives (From admission, onward)    None       REVIEW OF SYSTEMS:  Const: negative fever, negative chills, negative weight loss Eyes: negative diplopia or visual changes, negative eye pain ENT: negative coryza, negative sore throat Resp: negative cough, hemoptysis, dyspnea Cards: negative for chest pain, palpitations, lower extremity edema GU: negative for frequency, dysuria and hematuria GI: Negative for abdominal pain, diarrhea, bleeding, constipation Skin: negative for rash and pruritus Heme: negative for easy bruising and gum/nose bleeding MS: left inner thigh pain Neurolo:negative for headaches, dizziness, vertigo, memory problems  Psych: negative for feelings of anxiety, depression  Endocrine: negative for thyroid , diabetes Allergy/Immunology- multiple allergies as listed above Objective:  VITALS:  BP (!) 145/76   Pulse (!) 54   Temp (!) 97 F (36.1 C) (Temporal)   Ht 5' 5 (1.651 m)   Wt 249 lb (112.9 kg)   SpO2 96%   BMI 41.44 kg/m  LDA PORT PHYSICAL EXAM:  General: Alert,  cooperative, no distress, appears stated age. In wheel chair Head: Normocephalic, without obvious abnormality, atraumatic. Eyes: Conjunctivae clear, anicteric sclerae. Pupils are equal ENT Nares normal. No drainage or sinus tenderness. Lips, mucosa, and tongue normal. No Thrush Neck: Supple, symmetrical, no adenopathy, thyroid : non tender no carotid bruit and no JVD. Back: No CVA tenderness. Lungs: Clear to auscultation bilaterally. No Wheezing or Rhonchi. No rales. Heart: Regular rate and rhythm, no murmur, rub or gallop. Abdomen: Soft, non-tender,not distended. Bowel sounds normal. No masses Extremities: left thigh surgical scar- healed completely  LEFT TKA site healthy Skin: No rashes or lesions. Or bruising Lymph: Cervical, supraclavicular normal. Neurologic: Grossly non-focal Pertinent Labs Lab Results       Latest Ref Rng & Units 12/11/2023    1:14  PM 11/03/2023    9:05 AM 10/22/2023    4:10 AM  CMP  Glucose 70 - 99 mg/dL 886  889  88   BUN 8 - 23 mg/dL 21  15  19    Creatinine 0.44 - 1.00 mg/dL 9.16  9.16  9.13   Sodium 135 - 145 mmol/L 138  135  137   Potassium 3.5 - 5.1 mmol/L 4.2  3.5  4.0   Chloride 98 - 111 mmol/L 107  101  99   CO2 22 - 32 mmol/L 22  26  29    Calcium  8.9 - 10.3 mg/dL 8.4  8.7  8.7   Total Protein 6.5 - 8.1 g/dL 6.7  7.0    Total Bilirubin 0.0 - 1.2 mg/dL 0.5  0.9    Alkaline Phos 38 - 126 U/L 69  74    AST 15 - 41 U/L 17  22    ALT 0 - 44 U/L 13  15     Labs from 4/29 - CK 34, creatinine 0.84, hemoglobin 10, WBC 3.3 , eosinophils 4%   ? 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 She underwent surgical debridement and antibiotic bead placement on October 17, 2023. Completed 6 weeks of IV dapt and now on bactrim -she has taken 12 weeks of antibiotic so far Will discuss with Dr.Aberman and we may be able to stop antibiotics  after seeing the results  of ESR, CRP from today Hs af/u appt with Dr.Aberman next week  Bilateral hip replacement     Atrial fibrillation   On eliquis   Hypertension   She takes PRN losartan /hydrochlorothiazide  as needed for elevated blood pressure episodes. Watch for K/Cr as  on bactrim    Restless legs syndrome is managed with Requip . .  Knee replacement   Left Knee replacement with no current issues . Discussed the management in detail with her and her DIL  Follow up 3 months if needed

## 2024-01-24 ENCOUNTER — Encounter: Payer: Self-pay | Admitting: Family

## 2024-01-24 DIAGNOSIS — F419 Anxiety disorder, unspecified: Secondary | ICD-10-CM

## 2024-01-25 ENCOUNTER — Encounter: Payer: Self-pay | Admitting: Family

## 2024-01-25 MED ORDER — BUSPIRONE HCL 5 MG PO TABS: 5.0000 mg | ORAL_TABLET | Freq: Three times a day (TID) | ORAL | 3 refills | Status: AC | PRN

## 2024-02-02 ENCOUNTER — Encounter: Payer: Self-pay | Admitting: Pharmacist

## 2024-02-02 NOTE — Progress Notes (Signed)
 Pharmacy Quality Measure Review  This patient is appearing on a report for being at risk of failing the adherence measure for hypertension (ACEi/ARB) medications this calendar year.   Medication: losartan /hydrochlorothiazide  20/12.5 mg Last fill date: 10/09/23 for 60 day supply  Prescribed as needed. No further action at this time. Will continue to appear in adherence list.

## 2024-02-06 ENCOUNTER — Ambulatory Visit: Payer: Self-pay

## 2024-02-07 ENCOUNTER — Other Ambulatory Visit: Payer: Self-pay

## 2024-02-07 DIAGNOSIS — T8450XD Infection and inflammatory reaction due to unspecified internal joint prosthesis, subsequent encounter: Secondary | ICD-10-CM

## 2024-02-07 DIAGNOSIS — T8452XD Infection and inflammatory reaction due to internal left hip prosthesis, subsequent encounter: Secondary | ICD-10-CM

## 2024-02-08 NOTE — Progress Notes (Deleted)
 Date:  02/08/2024   ID:  Amber Barnes, DOB 02-11-1951, MRN [id number]  Patient Location:  [address] [place] [address]   Provider location:   [facility], [place] office  PCP:  Amber Rollene MATSU, FNP  Cardiologist:  [facility]  No chief complaint on file.   History of Present Illness:    Amber Barnes is a 73 y.o. female past medical history of Paroxysmal atrial fibrillation, cardioversion 2018 Normal sinus rhythm did not hold on flecainide , changed to amiodarone   cardioversion, normal sinus rhythm obesity,  arthritis,  hyperlipidemia,  hypertension,  OSA , not on CPAP Migraines, anxiety Stress test January 2018 showing no ischemia, Ejection fraction 55-65% CHA2DS2-VASc of at least 3 (female, age, HTN). Coronary calcium  score 31.6.  EF 50 to 55% in NSR on June 2024 who presents for follow-up of her hyperlipidemia, hypertension, persistent atrial fibrillation  Last seen by myself in clinic April 2024 Underwent A-fib ablation June 2024 by Dr. Cindie on 12/12/2022  Postprocedural pericarditis started on ibuprofen , colchicine  Remains on Eliquis  5 twice daily, CHADS2VASC score of 5.  Remains on flecainide  and bisoprolol   Echo June 2024 EF 50 to 55%  Needs left hip replacement Already had surgery on the right, recovered well  Heart rate low on today's visit, asymptomatic Remains on bisoprolol  15 twice daily with flecainide  100 twice daily  Reports chronic mild shortness of breath, feels this is likely from deconditioning As water  walking  Recent testing reviewed Stress test completed November 01, 2022 Perfusion defect noted in the anterior apical, apical septal, apical region, possible small region of ischemia  Echocardiogram ejection fraction 45 to 50% in afib Moderately reduced RV function Mild to moderate TR  EKG personally reviewed by myself on todays visit     Other past medical history  reviewed In hospital early January 2023 6 days of IV antibiotic for your kidney infection Acute pyelonephritis and ureterovesical junction (UVJ) obstruction and right hydroureteronephrosis --Dr. Francisca of urology did urgent Bilateral ureteral stent placement.  bisoprolol  and Cardizem  are held due to your low heart rate.   Eliquis  held due to significant blood in the urine. Was in atrial fib on arrival D/c on 07/17/2021  NSR 07/29/2021 when echo performed Normal ejection fraction greater than 60%  Hx of breast cancer HER2 positive in July 2022, underwent lumpectomy, lymph node biopsy, recent start of chemotherapy with weekly Taxol  plus  trastuzumab  .    Chemotherapy early September 2022 She presented to the emergency room September 26, anorexia, malaise, shortness of breath, atrial fibrillation Heart rate in the emergency room 137 bpm Started on broad-spectrum antibiotics for possible sepsis, neutropenic fevers Started on diltiazem  infusion for rate control, has been continued on her Eliquis  Heart rate running low 100 bpm, asymptomatic   cardioversion procedure 09/07/2016  Past Medical History:  Diagnosis Date   Achilles tendinitis    Acute medial meniscal tear    Allergy    Anemia    Anxiety    a.) on BZO PRN (alprazolam )   Aortic atherosclerosis (HCC)    Ascending aorta dilatation (HCC) 10/20/2022   a.) TTE 10/20/2022: asc Ao 39 mm   Atrial fibrillation/flutter (HCC) 07/2016   a.) Dx'd 07/2016; b.) CHA2DS2-VASc = 5 (age, sex, HFmrEF, HTN, vasc disease history) as of 05/09/2023; c.) s/p DCCV 09/07/2016 (150 J x 2 --> A.flutter; 200 J x 1 --> NSR); d.) s/p DCCV 10/19/2016 (150 J x 1 --> NSR); e.)  brought in for DCCV 12/2016; cancelled d/t NSR; f.) s/p PVI ablation 12/12/2022; g.) cardiac rate/rhythm maintained on oral bisoprolol ; chronically anticoagulated using apixaban    BMI 50.0-59.9, adult (HCC) 07/16/2018   CAD (coronary artery disease)    a.) cCTA 12/08/2022: Ca2+ = 31.6  (58th %'ile for age/sex/race match control)   Cardiomegaly    Chemotherapy induced nausea and vomiting 05/18/2021   Chronic heart failure with mid-range ejection fraction (HFmrEF) (HCC)    a.) TTE 03/08/21: EF 60-65, mild LVH, G1DD, mild LAE, triv MR; b.) TTE 07/29/21: EF 60-65, G1DD, mild LAE, triv MR; c.) TTE 11/26/21: EF 55-60, G1DD, mild LAE, mild MR; d.) TTE 02/25/22: EF 60-65, G1DD, mild LAE, mild MR; e.) TTE 05/31/22: EF 55-60, mild LVH, G2DD, mild-mod MR; f.) TTE 10/20/22: EF 45-50, mild LAE, mod red RVSF, mild-mod TR, Asc Ao 39; g.) TTE 12/13/22: EF 50-55, mild LVH, mild MAC   CKD (chronic kidney disease), stage II    Colon polyp    Complication of anesthesia    a.) emergence delirium; b.) hypoxia after hip replacement   COPD (chronic obstructive pulmonary disease) (HCC)    DDD (degenerative disc disease), lumbar    a.) s/p fusion   Depression    Endometriosis    Family history of CLL (chronic lymphoid leukemia) 03/21/2020   Fibrocystic breast disease    GERD (gastroesophageal reflux disease)    History of kidney stones    History of stress test    a.) MV 07/29/2016: EF 55-65%. Small, mild defect  in mid anteroseptal, apical septal, and apical locations. No ischemia-->low risk; b.) MV 10/2022: mod sized partially rev apical ant, apical septal, and apical defect. EF 52%. No signific cor Ca2+; c.) cCTA 12/08/2022: Ca2+ = 31.6 (58th %'ile).   History of tobacco use    Hyperlipidemia    Hypertension    Hypothyroidism    a.) s/p partial thyroidectomy for nodule; pathology benign   Insomnia    Left knee pain, unspecified chronicity    Lipoma    Malignant neoplasm of upper-outer quadrant of right female breast (HCC) 03/03/2021   a.) stage 1A invasive mammary carcinoma (cT1b or T1c N0); ER (-), PR (+), HER2/neu (+); did not tolerate adjuvant chemotherapy (paclitacel + trastuzumab ) despite paclitaxel  dose reduction; s/p adjuvant XRT; cinished maintenance trastuzumab  07/27/2022; no adjuvant  endocrine therapy as no clear benefit   Migraine    Nephrolithiasis    On apixaban  therapy    OSA (obstructive sleep apnea)    a.) non-compliant with nocturnal PAP therapy   Osteoarthritis    Osteopenia    PAF (paroxysmal atrial fibrillation) (HCC) 12/13/2022   Peptic ulcer disease    a.) H. pylori   Pericarditis 12/12/2022   a.) following A.fib ablation; resolved   Pneumonia    Port-A-Cath in place    Primary localized osteoarthritis of pelvic region and thigh 03/02/2022   RLS (restless legs syndrome)    a.) on ropinirole    S/P TKR (total knee replacement), left 09/11/2023   S/P total left hip arthroplasty 05/11/2023   Vertigo 06/08/2016   Vitamin D  deficiency    Past Surgical History:  Procedure Laterality Date   ABDOMINAL HYSTERECTOMY     ovaries left   ACHILLES TENDON SURGERY     2012,01/2017   ATRIAL FIBRILLATION ABLATION N/A 12/12/2022   Procedure: ATRIAL FIBRILLATION ABLATION;  Surgeon: Cindie Ole DASEN, MD;  Location: [facility];  Service: Cardiovascular;  Laterality: N/A;   BREAST BIOPSY Right 01/15/2021  Affirm biopsy-X clip-INVASIVE MAMMARY CARCINOMA   BREAST BIOPSY Right 01/15/2021   u/s bx-venus clip INVASIVE MAMMARY CARCINOMA   BREAST BIOPSY Right 01/11/2024   us  bx x 2. 10:00 13cfm venus marker, 1:00 9 cmfn ribbon marker, paths pending   BREAST BIOPSY Right 01/04/2023   u/s bx ribbon clip path pending   BREAST BIOPSY Right 01/04/2023   US  RT BREAST BX W LOC DEV 1ST LESION IMG BX SPEC US  GUIDE 01/04/2023 [facility]   BREAST BIOPSY Right 01/11/2024   US  RT BREAST BX W LOC DEV EA ADD LESION IMG BX SPEC US  GUIDE 01/11/2024 [facility]   BREAST BIOPSY Right 01/11/2024   US  RT BREAST BX W LOC DEV 1ST LESION IMG BX SPEC US  GUIDE 01/11/2024 [facility]   BREAST LUMPECTOMY,RADIO FREQ LOCALIZER,AXILLARY SENTINEL LYMPH NODE BIOPSY Right 02/17/2021   Procedure: BREAST LUMPECTOMY,RADIO FREQ LOCALIZER,AXILLARY SENTINEL LYMPH NODE BIOPSY;  Surgeon:  Lane Shope, MD;  Location: [facility];  Service: General;  Laterality: Right;   CARDIOVERSION N/A 09/07/2016   Procedure: CARDIOVERSION;  Surgeon: Evalene JINNY Lunger, MD;  Location: [facility];  Service: Cardiovascular;  Laterality: N/A;   CARDIOVERSION N/A 10/19/2016   Procedure: CARDIOVERSION;  Surgeon: Evalene JINNY Lunger, MD;  Location: [facility];  Service: Cardiovascular;  Laterality: N/A;   CARDIOVERSION N/A 12/14/2016   Procedure: CARDIOVERSION;  Surgeon: Lunger Evalene JINNY, MD;  Location: [facility];  Service: Cardiovascular;  Laterality: N/A;   COLONOSCOPY     COLONOSCOPY WITH PROPOFOL  N/A 05/13/2020   Procedure: COLONOSCOPY WITH PROPOFOL ;  Surgeon: Toledo, Ladell POUR, MD;  Location: [facility];  Service: Gastroenterology;  Laterality: N/A;  ELIQUIS    CYSTOSCOPY WITH STENT PLACEMENT Bilateral 07/12/2021   Procedure: CYSTOSCOPY WITH STENT PLACEMENT;  Surgeon: Francisca Redell BROCKS, MD;  Location: [facility];  Service: Urology;  Laterality: Bilateral;   CYSTOSCOPY/URETEROSCOPY/HOLMIUM LASER/STENT PLACEMENT Bilateral 07/30/2021   Procedure: CYSTOSCOPY/URETEROSCOPY/HOLMIUM LASER/BILATERAL STENT REMOVAL;  Surgeon: Francisca Redell BROCKS, MD;  Location: [facility];  Service: Urology;  Laterality: Bilateral;   ESOPHAGOGASTRODUODENOSCOPY     ESOPHAGOGASTRODUODENOSCOPY (EGD) WITH PROPOFOL  N/A 05/13/2020   Procedure: ESOPHAGOGASTRODUODENOSCOPY (EGD) WITH PROPOFOL ;  Surgeon: Toledo, Ladell POUR, MD;  Location: [facility];  Service: Gastroenterology;  Laterality: N/A;   EXCISIONAL TOTAL HIP ARTHROPLASTY WITH ANTIBIOTIC SPACERS Left 10/17/2023   Procedure: EXCISIONAL TOTAL HIP ARTHROPLASTY WITH PLACEMENT OF ANTIBIOTIC BEADS AND DRAIN;  Surgeon: Lorelle Hussar, MD;  Location: [facility];  Service: Orthopedics;  Laterality: Left;   FOOT SURGERY Left    tendon   LUMBAR FUSION     PORTA CATH INSERTION N/A 03/23/2021   Procedure: PORTA CATH INSERTION;  Surgeon: Jama Cordella MATSU, MD;  Location: [facility];   Service: Cardiovascular;  Laterality: N/A;   PORTACATH PLACEMENT N/A 03/23/2021   Procedure: ATTEMPTED INSERTION PORT-A-CATH;  Surgeon: Lane Shope, MD;  Location: [facility];  Service: General;  Laterality: N/A;   THYROIDECTOMY  1990   half thyroid  lobectomy secondary to a benign nodule   TOTAL HIP ARTHROPLASTY Right 08/21/2018   Procedure: TOTAL HIP ARTHROPLASTY ANTERIOR APPROACH-RIGHT CELL SAVER REQUESTED;  Surgeon: Kathlynn Sharper, MD;  Location: [facility];  Service: Orthopedics;  Laterality: Right;   TOTAL HIP ARTHROPLASTY Left 05/11/2023   Procedure: TOTAL HIP ARTHROPLASTY ANTERIOR APPROACH;  Surgeon: Lorelle Hussar, MD;  Location: [facility];  Service: Orthopedics;  Laterality: Left;   TOTAL KNEE ARTHROPLASTY Left 09/11/2023   Procedure: ARTHROPLASTY, KNEE, TOTAL;  Surgeon: Lorelle Hussar, MD;  Location: [facility];  Service: Orthopedics;  Laterality: Left;  Allergies:   Hydrocodone , Prednisone , Amiodarone , Hydrocodone -acetaminophen , Tapentadol, Adhesive [tape], Latex, Oxycodone , Tramadol , and Wound dressing adhesive   Social History   Tobacco Use   Smoking status: Former    Current packs/day: 0.00    Average packs/day: 0.3 packs/day for 10.0 years (2.5 ttl pk-yrs)    Types: Cigarettes    Start date: 57    Quit date: 2005    Years since quitting: 20.5    Passive exposure: Past   Smokeless tobacco: Never   Tobacco comments:    smoked for 10 years, 1 pack every 2-3 days  Vaping Use   Vaping status: Never Used  Substance Use Topics   Alcohol use: Yes    Alcohol/week: 1.0 standard drink of alcohol    Types: 1 Glasses of wine per week    Comment: margarita qd   Drug use: Yes    Types: Marijuana     Current Outpatient Medications on File Prior to Visit  Medication Sig Dispense Refill   acetaminophen  (TYLENOL ) 500 MG tablet Take 2 tablets (1,000 mg total) by mouth every 8 (eight) hours. 30 tablet 0   ALPRAZolam  (XANAX ) 0.25 MG tablet Take 1 tablet (0.25 mg total) by  mouth daily as needed for anxiety.     bisoprolol  (ZEBETA ) 10 MG tablet TAKE 1.5 TABLETS BY MOUTH 2 TIMES DAILY. 270 tablet 3   busPIRone  (BUSPAR ) 5 MG tablet Take 1 tablet (5 mg total) by mouth 3 (three) times daily as needed. 270 tablet 3   Calcium  Carbonate (CALCIUM  500 PO) Take 1 tablet by mouth daily.     Cholecalciferol  (VITAMIN D3) 5000 units CAPS Take 5,000 Units by mouth daily.     docusate sodium  (COLACE) 100 MG capsule Take 1 capsule (100 mg total) by mouth 2 (two) times daily. 10 capsule 0   ELIQUIS  5 MG TABS tablet TAKE 1 TABLET BY MOUTH TWICE A DAY 180 tablet 1   escitalopram  (LEXAPRO ) 20 MG tablet Take 1 tablet (20 mg total) by mouth daily. 90 tablet 3   famotidine  (PEPCID ) 20 MG tablet Take 1 tablet (20 mg total) by mouth 2 (two) times daily. 60 tablet 0   furosemide  (LASIX ) 20 MG tablet Take 1 tablet (20 mg total) by mouth daily. May take additional 20mg  PO prn for leg swelling 180 tablet 3   KLOR-CON  M20 20 MEQ tablet TAKE 1 TABLET BY MOUTH EVERY DAY 90 tablet 3   losartan -hydrochlorothiazide  (HYZAAR) 50-12.5 MG tablet Take 0.5 tablets by mouth daily as needed (for BP 140/80). 30 tablet 1   metoCLOPramide  (REGLAN ) 10 MG tablet Take 1 tablet (10 mg total) by mouth every 6 (six) hours as needed. 30 tablet 0   Multiple Vitamin (MULTIVITAMIN) tablet Take 1 tablet by mouth daily.     ondansetron  (ZOFRAN ) 4 MG tablet Take 1 tablet (4 mg total) by mouth every 6 (six) hours as needed for nausea. 20 tablet 0   ranolazine  (RANEXA ) 500 MG 12 hr tablet TAKE 1 TABLET BY MOUTH TWICE A DAY 180 tablet 3   rOPINIRole  (REQUIP ) 2 MG tablet TAKE 1 TABLET BY MOUTH EVERYDAY AT BEDTIME 90 tablet 3   rosuvastatin  (CRESTOR ) 40 MG tablet TAKE 1 TABLET BY MOUTH EVERY DAY 90 tablet 3   sulfamethoxazole -trimethoprim  (BACTRIM  DS) 800-160 MG tablet TAKE 1 TABLET BY MOUTH TWICE A DAY 60 tablet 1   No current facility-administered medications on file prior to visit.     Family Hx: The patient's family  history includes Breast cancer in  her paternal aunt; Cancer in her brother and mother; Cancer (age of onset: 21) in her sister; Cancer (age of onset: 59) in her father; Depression in her sister; Heart attack (age of onset: 55) in her father; Heart disease in her father; Hyperlipidemia in her father; Hypertension in her father. There is no history of Migraines or Thyroid  cancer.  ROS:   Please see the history of present illness.    Review of Systems  Constitutional:  Positive for malaise/fatigue.  HENT: Negative.    Respiratory: Negative.    Cardiovascular: Negative.   Gastrointestinal:  Positive for nausea.  Musculoskeletal: Negative.   Neurological: Negative.   Psychiatric/Behavioral: Negative.    All other systems reviewed and are negative.    Labs/Other Tests and Data Reviewed:    Recent Labs: 10/22/2023: Magnesium  2.2 01/18/2024: ALT 13; BUN 22; Creatinine, Ser 0.79; Hemoglobin 11.6; Platelets 165; Potassium 4.2; Sodium 135   Recent Lipid Panel Lab Results  Component Value Date/Time   CHOL 101 12/14/2022 04:49 AM   TRIG 51 12/14/2022 04:49 AM   HDL 48 12/14/2022 04:49 AM   CHOLHDL 2.1 12/14/2022 04:49 AM   LDLCALC 43 12/14/2022 04:49 AM    Wt Readings from Last 3 Encounters:  01/18/24 249 lb (112.9 kg)  01/01/24 245 lb 3.2 oz (111.2 kg)  12/25/23 235 lb (106.6 kg)     Exam:    Vital Signs: Vital signs may also be detailed in the HPI There were no vitals taken for this visit.  Constitutional:  oriented to person, place, and time. No distress.  HENT:  Head: Grossly normal Eyes:  no discharge. No scleral icterus.  Neck: No JVD, no carotid bruits  Cardiovascular: Regular rate and rhythm, no murmurs appreciated Pulmonary/Chest: Clear to auscultation bilaterally, no wheezes or rails Abdominal: Soft.  no distension.  no tenderness.  Musculoskeletal: Normal range of motion Neurological:  normal muscle tone. Coordination normal. No atrophy Skin: Skin warm and  dry Psychiatric: normal affect, pleasant  ASSESSMENT & PLAN:    Problem List Items Addressed This Visit   None   Preop cardiovascular hip surgery acceptable risk for left hip replacement surgery No further cardiac testing needed  Paroxysmal atrial fibrillation/atrial flutter Ablation 6/24 Maintining NSR  Breast cancer Reports she has completed treatments  Diastolic CHF Euvolemic on Lasix  20 daily Chronic mild shortness of breath, extra Lasix  as needed Walking program recommended as tolerated with her hip  Bradycardia Recommend she decrease bisoprolol  down to 10 twice daily Monitor heart rate and call our office if it continues to run in the 40s Goal would be in the mid 50s or higher  Essential hypertension Change to bisoprolol  dosing as above   Total encounter time more than 30 minutes  Greater than 50% was spent in counseling and coordination of care with the patient    Signed, Evalene Lunger, MD  [facility] [address], [place], [place] [address]

## 2024-02-09 ENCOUNTER — Ambulatory Visit: Attending: Cardiovascular Disease | Admitting: Cardiovascular Disease

## 2024-02-09 DIAGNOSIS — I10 Essential (primary) hypertension: Secondary | ICD-10-CM

## 2024-02-09 DIAGNOSIS — I5032 Chronic diastolic (congestive) heart failure: Secondary | ICD-10-CM

## 2024-02-09 DIAGNOSIS — R0609 Other forms of dyspnea: Secondary | ICD-10-CM

## 2024-02-09 DIAGNOSIS — I4819 Other persistent atrial fibrillation: Secondary | ICD-10-CM

## 2024-02-09 DIAGNOSIS — I2510 Atherosclerotic heart disease of native coronary artery without angina pectoris: Secondary | ICD-10-CM

## 2024-02-09 DIAGNOSIS — E785 Hyperlipidemia, unspecified: Secondary | ICD-10-CM

## 2024-02-09 DIAGNOSIS — I4892 Unspecified atrial flutter: Secondary | ICD-10-CM

## 2024-02-14 ENCOUNTER — Other Ambulatory Visit: Payer: Self-pay | Admitting: Family

## 2024-02-14 ENCOUNTER — Other Ambulatory Visit: Payer: Self-pay | Admitting: Cardiovascular Disease

## 2024-02-14 DIAGNOSIS — I5032 Chronic diastolic (congestive) heart failure: Secondary | ICD-10-CM

## 2024-02-14 NOTE — Telephone Encounter (Signed)
 Prescription refill request for Eliquis  received. Indication:afib Last office visit:5/25 Scr:0.79  7/25 Age: 73 Weight:112.9  kg  Prescription refilled

## 2024-02-15 ENCOUNTER — Other Ambulatory Visit: Payer: Self-pay | Admitting: Cardiovascular Disease

## 2024-02-15 ENCOUNTER — Inpatient Hospital Stay: Payer: Medicare Other | Admitting: Oncology

## 2024-02-15 ENCOUNTER — Encounter: Payer: Self-pay | Admitting: Oncology

## 2024-02-15 ENCOUNTER — Inpatient Hospital Stay: Payer: Medicare Other | Attending: Oncology

## 2024-02-15 VITALS — BP 139/62 | HR 59 | Temp 96.6°F

## 2024-02-15 DIAGNOSIS — Z1721 Progesterone receptor positive status: Secondary | ICD-10-CM | POA: Insufficient documentation

## 2024-02-15 DIAGNOSIS — Z171 Estrogen receptor negative status [ER-]: Secondary | ICD-10-CM | POA: Diagnosis not present

## 2024-02-15 DIAGNOSIS — Z923 Personal history of irradiation: Secondary | ICD-10-CM | POA: Diagnosis not present

## 2024-02-15 DIAGNOSIS — R7989 Other specified abnormal findings of blood chemistry: Secondary | ICD-10-CM | POA: Insufficient documentation

## 2024-02-15 DIAGNOSIS — G629 Polyneuropathy, unspecified: Secondary | ICD-10-CM | POA: Insufficient documentation

## 2024-02-15 DIAGNOSIS — C50411 Malignant neoplasm of upper-outer quadrant of right female breast: Secondary | ICD-10-CM

## 2024-02-15 DIAGNOSIS — Z79899 Other long term (current) drug therapy: Secondary | ICD-10-CM | POA: Insufficient documentation

## 2024-02-15 DIAGNOSIS — Z87891 Personal history of nicotine dependence: Secondary | ICD-10-CM | POA: Diagnosis not present

## 2024-02-15 DIAGNOSIS — Z9221 Personal history of antineoplastic chemotherapy: Secondary | ICD-10-CM | POA: Insufficient documentation

## 2024-02-15 DIAGNOSIS — Z1731 Human epidermal growth factor receptor 2 positive status: Secondary | ICD-10-CM | POA: Insufficient documentation

## 2024-02-15 DIAGNOSIS — Z95828 Presence of other vascular implants and grafts: Secondary | ICD-10-CM

## 2024-02-15 DIAGNOSIS — Z7901 Long term (current) use of anticoagulants: Secondary | ICD-10-CM | POA: Insufficient documentation

## 2024-02-15 LAB — CMP (CANCER CENTER ONLY)
ALT: 13 U/L (ref 0–44)
AST: 19 U/L (ref 15–41)
Albumin: 3.8 g/dL (ref 3.5–5.0)
Alkaline Phosphatase: 62 U/L (ref 38–126)
Anion gap: 10 (ref 5–15)
BUN: 24 mg/dL — ABNORMAL HIGH (ref 8–23)
CO2: 27 mmol/L (ref 22–32)
Calcium: 9.2 mg/dL (ref 8.9–10.3)
Chloride: 103 mmol/L (ref 98–111)
Creatinine: 1.19 mg/dL — ABNORMAL HIGH (ref 0.44–1.00)
GFR, Estimated: 49 mL/min — ABNORMAL LOW (ref >60)
Glucose, Bld: 76 mg/dL (ref 70–99)
Potassium: 4.7 mmol/L (ref 3.5–5.1)
Sodium: 140 mmol/L (ref 135–145)
Total Bilirubin: 0.7 mg/dL (ref 0.0–1.2)
Total Protein: 7.1 g/dL (ref 6.5–8.1)

## 2024-02-15 LAB — CBC WITH DIFFERENTIAL (CANCER CENTER ONLY)
Abs Immature Granulocytes: 0 K/uL (ref 0.00–0.07)
Basophils Absolute: 0 K/uL (ref 0.0–0.1)
Basophils Relative: 1 %
Eosinophils Absolute: 0.1 K/uL (ref 0.0–0.5)
Eosinophils Relative: 1 %
HCT: 36.8 % (ref 36.0–46.0)
Hemoglobin: 12.3 g/dL (ref 12.0–15.0)
Immature Granulocytes: 0 %
Lymphocytes Relative: 18 %
Lymphs Abs: 0.7 K/uL (ref 0.7–4.0)
MCH: 31.2 pg (ref 26.0–34.0)
MCHC: 33.4 g/dL (ref 30.0–36.0)
MCV: 93.4 fL (ref 80.0–100.0)
Monocytes Absolute: 0.3 K/uL (ref 0.1–1.0)
Monocytes Relative: 8 %
Neutro Abs: 2.7 K/uL (ref 1.7–7.7)
Neutrophils Relative %: 72 %
Platelet Count: 160 K/uL (ref 150–400)
RBC: 3.94 MIL/uL (ref 3.87–5.11)
RDW: 15.2 % (ref 11.5–15.5)
WBC Count: 3.8 K/uL — ABNORMAL LOW (ref 4.0–10.5)
nRBC: 0 % (ref 0.0–0.2)

## 2024-02-15 NOTE — Assessment & Plan Note (Addendum)
#  Right breast invasive carcinoma pT1b pN0 ER negative, PR weakly positive HER 2 positive.  Not on endocrine therapy due to unclear magnitude of benefit Patient does not tolerate adjuvant chemotherapy with Taxol  and trastuzumab  despite Taxol  dose reduction.-->Status post adjuvant radiation--> 07/27/2022 Finished maintenance Trastuzumab  Labs are reviewed and discussed with patient. She is doing well clinically.  Mammogram and MRI breasts findings are reviewed, biopsy showed fat necrosis. Recommend repeat MRI breast in 6 months

## 2024-02-15 NOTE — Assessment & Plan Note (Signed)
 Likely due to diuretics.  Will forward chemistry labs to primary care provider.

## 2024-02-15 NOTE — Progress Notes (Signed)
 Pt here for follow- up. Reports that she has had 3 orthopedic surgeries since last visit. She is currently on antibiotics for infection of left hip prosthesis.

## 2024-02-15 NOTE — Assessment & Plan Note (Signed)
 Pre-existing neuropathy in her fingertips bilaterally- secondary to cervical spine radiculopathy Improved, she is off gabapentin .

## 2024-02-15 NOTE — Progress Notes (Signed)
 Hematology/Oncology Progress note Telephone:([phone] Fax:([phone]     CHIEF COMPLAINTS/REASON FOR VISIT:  Follow-up for stage I HER2 positive breast cancer  ASSESSMENT & PLAN:   Cancer Staging  Malignant neoplasm of upper-outer quadrant of right female breast [facility]) Staging form: Breast, AJCC 8th Edition - Pathologic stage from 03/03/2021: Stage IA (pT1b, pN0, cM0, G3, ER-, PR+, HER2+) - Signed by [name], MD on 03/03/2021   Malignant neoplasm of upper-outer quadrant of right female breast [facility]) #Right breast invasive carcinoma pT1b pN0 ER negative, PR weakly positive HER 2 positive.  Not on endocrine therapy due to unclear magnitude of benefit Patient does not tolerate adjuvant chemotherapy with Taxol  and trastuzumab  despite Taxol  dose reduction.-->Status post adjuvant radiation--> 07/27/2022 Finished maintenance Trastuzumab  Labs are reviewed and discussed with patient. She is doing well clinically.  Mammogram and MRI breasts findings are reviewed, biopsy showed fat necrosis. Recommend repeat MRI breast in 6 months  Neuropathy Pre-existing neuropathy in her fingertips bilaterally- secondary to cervical spine radiculopathy Improved, she is off gabapentin .    Port-A-Cath in place Plan to get medi port removed, given her recent prosthetic joint infection.    Elevated serum creatinine Likely due to diuretics.  Will forward chemistry labs to primary care provider.    Follow up 6 months. All questions were answered. The patient knows to call the clinic with any problems, questions or concerns.  [name], MD, PhD [facility] Hematology Oncology 02/15/2024     HISTORY OF PRESENTING ILLNESS:   Amber Barnes is a  73 y.o.  female presents for follow up of right breast cancer.  Oncology History  Malignant neoplasm of upper-outer quadrant of right female breast (HCC)  01/19/2021 Initial Diagnosis   Breast cancer in female  -12/30/2020, patient had a screening  mammogram done which showed possible mass and asymmetry in the right breast. 01/08/2021, diagnostic mammogram of the right breast showed 2 adjacent irregular spiculated masses.  Larger 1 was 10 mm and a smaller mass measured 5 mm. 01/15/2021 stereotactic biopsy of the right breast smaller mass showed invasive carcinoma no special type, grade 2, DCIS present, LVI negative.Ultrasound guided biopsy of the larger mass showed invasive mammary carcinoma  02/17/2021, patient is status post lumpectomy and sentinel lymph node biopsy Pathology showed invasive mammary carcinoma, 2 separate foci, DCIS, negative sentinel lymph node biopsy 0/1, grade 3, or margins negative for invasive carcinoma.  ER -, PR + [1-10%], HER2 +  pT1b(m) pN0   Strong family history of cancer, Discussed again about genetic testing. patient adamantly declined.   03/03/2021 Cancer Staging   Staging form: Breast, AJCC 8th Edition - Pathologic stage from 03/03/2021: Stage IA (pT1b, pN0, cM0, G3, ER-, PR+, HER2+) - Signed by [name], MD on 03/03/2021 Stage prefix: Initial diagnosis Multigene prognostic tests performed: None Histologic grading system: 3 grade system   03/08/2021 Echocardiogram   echocardiogram showed LVEF 60-65%. Per patient, she has been seen by Dr. Tresia team and was cleared for proceeding with chemotherapy.    02/2021 Genetic Testing   Strong family history of cancer, Discussed again about genetic testing. patient adamantly declined.   03/23/2021 Procedure   Mediport by vascular surgeon   03/24/2021 -  Chemotherapy   03/24/2021 - 03/31/2021 Taxol  80 mg/m2 /trastuzumab  04/05/2021 - 04/09/2021 patient was admitted due to neutropenic fever with associated SIRS.  Patient was treated with empiric cefepime  and vancomycin  until date of discharge when ANC recovered to 1.4. Post hospitalization, patient also had COVID-19 infection so her  follow-up appointment was further postponed for another 2 weeks.  05/10/2021 resumed dose  reduce Taxol  65mg /m2 /trastuzumab  05/25/2021-06/30/2021, cycle 2 dose reduced Taxol  65mg /m2 /trastuzumab    admitted from 07/12/2021 - 07/17/2021 due to acute pyelonephritis UVJ obstruction and a right hydro ureteralnephrosis.  Patient was treated with IV antibiotics status post emergent bilateral ureteral stent placement.  Discharged home with a course of oral antibiotics.  Shared decision made to hold chemotherapy and proceed with radiation.    08/18/2021 - 09/08/2021 Radiation Therapy    adjuvant breast radiation   10/06/2021 -  Chemotherapy   10/06/2021, shared decision was made to proceed with maintenance trastuzumab .  Her cardiologist Dr. Gollan gave clearance to proceed. Plan Transtuzumab for up to a year if tolerates.    12/14/2021 Mammogram   Bilateral diagnostic mammogram 1. Probably benign right breast calcifications along the posterior lumpectomy bed felt to represent early changes of fat necrosis.Recommend short-term imaging follow-up. 2. No mammographic evidence of malignancy on the left.   03/02/2022 Echocardiogram   LVEF 60-65%, grade I diastolic dysfuction   05/31/2022 Echocardiogram   LVEF is 55 to 60%, Grade II diastolic dysfunction    10/20/2022 Echocardiogram   LVEF 45-50%   12/28/2022 Mammogram   Bilateral diagnostic mammogram showed There is an indeterminate 8 mm mass in the RIGHT upper inner breast  S/p US  guided biopsy of right breast lesion.  Pathology showed Fat necrosis, negative for malignancy.  Radiology feels this results is concordant.    07/07/2023 Mammogram   Unilateral diagnostic mammogram -right 1. No evidence of malignancy. 2. The previously described probably benign right breast calcifications are benign calcifications associated with fat necrosis and oil cyst formation.   12/29/2023 Mammogram   Bilateral diagnostic mammogram Interval increase of irregular hypoechoic mass in the right breast 1 o'clock position.  Mass was previously biopsied in June 2024  with concordant pathology of benign fat necrosis.  Given the interval increase in size, recommend bilateral breast MRI   01/09/2024 Imaging   Bilateral MRI breast with and without contrast showed  Multiple masslike area of abnormal enhancement in the right breast the dominant area surrounding the lumpectomy scar extending to the skin surface.  No left breast malignancy.  No enlarged abnormal lymph node.    01/11/2024 Procedure   Patient underwent right breast biopsy of 10:00 and 1:00  1. Breast, right, needle core biopsy, 10:00 13cmfn (venus clip) :       -  EXTENSIVE FAT NECROSIS WITH FIBROSIS, CHRONIC INFLAMMATION AND ASSOCIATED       CALCIFICATIONS.        2. Breast, right, needle core biopsy, 1:00 9cmfn (ribbon clip) :       -  EXTENSIVE FAT NECROSIS WITH FIBROSIS, CHRONIC INFLAMMATION AND ASSOCIATED       CALCIFICATIONS.    HER2-positive carcinoma of breast (HCC)    INTERVAL HISTORY Amber Barnes is a 73 y.o. female who has above history reviewed by me today presents for follow up visit for management of right side HER 2 positive breast cancer  No new breast concerns.  October 2024 left hip replacement.  March patient underwent left knee total knee replacement.  Patient was hospitalized for 10/16/2023 - 10/21/2023 due to prosthetic joint infection, Methicillin-resistant Staphylococcus lugdunensis.  Status post left hip revision arthroplasty. Patient was treated with IV antibiotics and recently switched to oral antibiotics.  Today she reports feeling well.  No fever or chills.  Denies hip pain.  Review of Systems  Constitutional:  Positive  for fatigue. Negative for appetite change, chills, fever and unexpected weight change.  HENT:   Negative for hearing loss and voice change.   Respiratory:  Negative for chest tightness and cough.   Cardiovascular:  Negative for chest pain and leg swelling.  Gastrointestinal:  Negative for abdominal distention, abdominal pain, blood in stool, nausea  and vomiting.  Endocrine: Negative for hot flashes.  Genitourinary:  Negative for difficulty urinating, frequency and hematuria.   Musculoskeletal:  Positive for arthralgias.  Skin:  Negative for itching and rash.  Neurological:  Positive for numbness. Negative for dizziness and extremity weakness.  Hematological:  Negative for adenopathy.  Psychiatric/Behavioral:  Negative for confusion.     MEDICAL HISTORY:  Past Medical History:  Diagnosis Date   Achilles tendinitis    Acute medial meniscal tear    Allergy    Anemia    Anxiety    a.) on BZO PRN (alprazolam )   Aortic atherosclerosis (HCC)    Ascending aorta dilatation (HCC) 10/20/2022   a.) TTE 10/20/2022: asc Ao 39 mm   Atrial fibrillation/flutter (HCC) 07/2016   a.) Dx'd 07/2016; b.) CHA2DS2-VASc = 5 (age, sex, HFmrEF, HTN, vasc disease history) as of 05/09/2023; c.) s/p DCCV 09/07/2016 (150 J x 2 --> A.flutter; 200 J x 1 --> NSR); d.) s/p DCCV 10/19/2016 (150 J x 1 --> NSR); e.) brought in for DCCV 12/2016; cancelled d/t NSR; f.) s/p PVI ablation 12/12/2022; g.) cardiac rate/rhythm maintained on oral bisoprolol ; chronically anticoagulated using apixaban    BMI 50.0-59.9, adult (HCC) 07/16/2018   CAD (coronary artery disease)    a.) cCTA 12/08/2022: Ca2+ = 31.6 (58th %'ile for age/sex/race match control)   Cardiomegaly    Chemotherapy induced nausea and vomiting 05/18/2021   Chronic heart failure with mid-range ejection fraction (HFmrEF) (HCC)    a.) TTE 03/08/21: EF 60-65, mild LVH, G1DD, mild LAE, triv MR; b.) TTE 07/29/21: EF 60-65, G1DD, mild LAE, triv MR; c.) TTE 11/26/21: EF 55-60, G1DD, mild LAE, mild MR; d.) TTE 02/25/22: EF 60-65, G1DD, mild LAE, mild MR; e.) TTE 05/31/22: EF 55-60, mild LVH, G2DD, mild-mod MR; f.) TTE 10/20/22: EF 45-50, mild LAE, mod red RVSF, mild-mod TR, Asc Ao 39; g.) TTE 12/13/22: EF 50-55, mild LVH, mild MAC   CKD (chronic kidney disease), stage II    Colon polyp    Complication of anesthesia    a.)  emergence delirium; b.) hypoxia after hip replacement   COPD (chronic obstructive pulmonary disease) (HCC)    DDD (degenerative disc disease), lumbar    a.) s/p fusion   Depression    Endometriosis    Family history of CLL (chronic lymphoid leukemia) 03/21/2020   Fibrocystic breast disease    GERD (gastroesophageal reflux disease)    History of kidney stones    History of stress test    a.) MV 07/29/2016: EF 55-65%. Small, mild defect  in mid anteroseptal, apical septal, and apical locations. No ischemia-->low risk; b.) MV 10/2022: mod sized partially rev apical ant, apical septal, and apical defect. EF 52%. No signific cor Ca2+; c.) cCTA 12/08/2022: Ca2+ = 31.6 (58th %'ile).   History of tobacco use    Hyperlipidemia    Hypertension    Hypothyroidism    a.) s/p partial thyroidectomy for nodule; pathology benign   Insomnia    Left knee pain, unspecified chronicity    Lipoma    Malignant neoplasm of upper-outer quadrant of right female breast (HCC) 03/03/2021   a.) stage 1A invasive mammary  carcinoma (cT1b or T1c N0); ER (-), PR (+), HER2/neu (+); did not tolerate adjuvant chemotherapy (paclitacel + trastuzumab ) despite paclitaxel  dose reduction; s/p adjuvant XRT; cinished maintenance trastuzumab  07/27/2022; no adjuvant endocrine therapy as no clear benefit   Migraine    Nephrolithiasis    On apixaban  therapy    OSA (obstructive sleep apnea)    a.) non-compliant with nocturnal PAP therapy   Osteoarthritis    Osteopenia    PAF (paroxysmal atrial fibrillation) (HCC) 12/13/2022   Peptic ulcer disease    a.) H. pylori   Pericarditis 12/12/2022   a.) following A.fib ablation; resolved   Pneumonia    Port-A-Cath in place    Primary localized osteoarthritis of pelvic region and thigh 03/02/2022   RLS (restless legs syndrome)    a.) on ropinirole    S/P TKR (total knee replacement), left 09/11/2023   S/P total left hip arthroplasty 05/11/2023   Vertigo 06/08/2016   Vitamin D  deficiency      SURGICAL HISTORY: Past Surgical History:  Procedure Laterality Date   ABDOMINAL HYSTERECTOMY     ovaries left   ACHILLES TENDON SURGERY     2012,01/2017   ATRIAL FIBRILLATION ABLATION N/A 12/12/2022   Procedure: ATRIAL FIBRILLATION ABLATION;  Surgeon: Cindie Ole DASEN, MD;  Location: [facility];  Service: Cardiovascular;  Laterality: N/A;   BREAST BIOPSY Right 01/15/2021   Affirm biopsy-X clip-INVASIVE MAMMARY CARCINOMA   BREAST BIOPSY Right 01/15/2021   u/s bx-venus clip INVASIVE MAMMARY CARCINOMA   BREAST BIOPSY Right 01/11/2024   us  bx x 2. 10:00 13cfm venus marker, 1:00 9 cmfn ribbon marker, paths pending   BREAST BIOPSY Right 01/04/2023   u/s bx ribbon clip path pending   BREAST BIOPSY Right 01/04/2023   US  RT BREAST BX W LOC DEV 1ST LESION IMG BX SPEC US  GUIDE 01/04/2023 [facility]   BREAST BIOPSY Right 01/11/2024   US  RT BREAST BX W LOC DEV EA ADD LESION IMG BX SPEC US  GUIDE 01/11/2024 [facility]   BREAST BIOPSY Right 01/11/2024   US  RT BREAST BX W LOC DEV 1ST LESION IMG BX SPEC US  GUIDE 01/11/2024 [facility]   BREAST LUMPECTOMY,RADIO FREQ LOCALIZER,AXILLARY SENTINEL LYMPH NODE BIOPSY Right 02/17/2021   Procedure: BREAST LUMPECTOMY,RADIO FREQ LOCALIZER,AXILLARY SENTINEL LYMPH NODE BIOPSY;  Surgeon: Lane Shope, MD;  Location: [facility];  Service: General;  Laterality: Right;   CARDIOVERSION N/A 09/07/2016   Procedure: CARDIOVERSION;  Surgeon: Evalene JINNY Lunger, MD;  Location: [facility];  Service: Cardiovascular;  Laterality: N/A;   CARDIOVERSION N/A 10/19/2016   Procedure: CARDIOVERSION;  Surgeon: Evalene JINNY Lunger, MD;  Location: [facility];  Service: Cardiovascular;  Laterality: N/A;   CARDIOVERSION N/A 12/14/2016   Procedure: CARDIOVERSION;  Surgeon: Lunger Evalene JINNY, MD;  Location: [facility];  Service: Cardiovascular;  Laterality: N/A;   COLONOSCOPY     COLONOSCOPY WITH PROPOFOL  N/A 05/13/2020   Procedure: COLONOSCOPY WITH PROPOFOL ;  Surgeon:  Toledo, Ladell POUR, MD;  Location: [facility];  Service: Gastroenterology;  Laterality: N/A;  ELIQUIS    CYSTOSCOPY WITH STENT PLACEMENT Bilateral 07/12/2021   Procedure: CYSTOSCOPY WITH STENT PLACEMENT;  Surgeon: Francisca Redell BROCKS, MD;  Location: [facility];  Service: Urology;  Laterality: Bilateral;   CYSTOSCOPY/URETEROSCOPY/HOLMIUM LASER/STENT PLACEMENT Bilateral 07/30/2021   Procedure: CYSTOSCOPY/URETEROSCOPY/HOLMIUM LASER/BILATERAL STENT REMOVAL;  Surgeon: Francisca Redell BROCKS, MD;  Location: [facility];  Service: Urology;  Laterality: Bilateral;   ESOPHAGOGASTRODUODENOSCOPY     ESOPHAGOGASTRODUODENOSCOPY (EGD) WITH PROPOFOL  N/A 05/13/2020   Procedure: ESOPHAGOGASTRODUODENOSCOPY (EGD) WITH PROPOFOL ;  Surgeon: Graettinger,  Teodoro K, MD;  Location: [facility];  Service: Gastroenterology;  Laterality: N/A;   EXCISIONAL TOTAL HIP ARTHROPLASTY WITH ANTIBIOTIC SPACERS Left 10/17/2023   Procedure: EXCISIONAL TOTAL HIP ARTHROPLASTY WITH PLACEMENT OF ANTIBIOTIC BEADS AND DRAIN;  Surgeon: Lorelle Hussar, MD;  Location: [facility];  Service: Orthopedics;  Laterality: Left;   FOOT SURGERY Left    tendon   LUMBAR FUSION     PORTA CATH INSERTION N/A 03/23/2021   Procedure: PORTA CATH INSERTION;  Surgeon: Jama Cordella MATSU, MD;  Location: [facility];  Service: Cardiovascular;  Laterality: N/A;   PORTACATH PLACEMENT N/A 03/23/2021   Procedure: ATTEMPTED INSERTION PORT-A-CATH;  Surgeon: Lane Shope, MD;  Location: [facility];  Service: General;  Laterality: N/A;   THYROIDECTOMY  1990   half thyroid  lobectomy secondary to a benign nodule   TOTAL HIP ARTHROPLASTY Right 08/21/2018   Procedure: TOTAL HIP ARTHROPLASTY ANTERIOR APPROACH-RIGHT CELL SAVER REQUESTED;  Surgeon: Kathlynn Sharper, MD;  Location: [facility];  Service: Orthopedics;  Laterality: Right;   TOTAL HIP ARTHROPLASTY Left 05/11/2023   Procedure: TOTAL HIP ARTHROPLASTY ANTERIOR APPROACH;  Surgeon: Lorelle Hussar, MD;  Location: [facility];   Service: Orthopedics;  Laterality: Left;   TOTAL KNEE ARTHROPLASTY Left 09/11/2023   Procedure: ARTHROPLASTY, KNEE, TOTAL;  Surgeon: Lorelle Hussar, MD;  Location: [facility];  Service: Orthopedics;  Laterality: Left;    SOCIAL HISTORY: Social History   Socioeconomic History   Marital status: Married    Spouse name: jeffrey   Number of children: 1   Years of education: Not on file   Highest education level: 12th grade  Occupational History    Comment: disabled  Tobacco Use   Smoking status: Former    Current packs/day: 0.00    Average packs/day: 0.3 packs/day for 10.0 years (2.5 ttl pk-yrs)    Types: Cigarettes    Start date: 52    Quit date: 2005    Years since quitting: 20.6    Passive exposure: Past   Smokeless tobacco: Never   Tobacco comments:    smoked for 10 years, 1 pack every 2-3 days  Vaping Use   Vaping status: Never Used  Substance and Sexual Activity   Alcohol use: Yes    Alcohol/week: 1.0 standard drink of alcohol    Types: 1 Glasses of wine per week    Comment: margarita qd   Drug use: Yes    Types: Marijuana   Sexual activity: Not Currently  Other Topics Concern   Not on file  Social History Narrative   Moved to [place] from [place], originally from [place].    1 cup of caffeine  daily   Right handed   Lives with husband, step-daughter and child       Social Drivers of Health   Financial Resource Strain: Low Risk  (01/01/2024)   Overall Financial Resource Strain (CARDIA)    Difficulty of Paying Living Expenses: Not hard at all  Food Insecurity: No Food Insecurity (01/01/2024)   Hunger Vital Sign    Worried About Running Out of Food in the Last Year: Never true    Ran Out of Food in the Last Year: Never true  Transportation Needs: No Transportation Needs (01/01/2024)   PRAPARE - Administrator, Civil Service (Medical): No    Lack of Transportation (Non-Medical): No  Physical Activity: Insufficiently Active  (01/01/2024)   Exercise Vital Sign    Days of Exercise per Week: 3 days    Minutes  of Exercise per Session: 30 min  Stress: Stress Concern Present (01/01/2024)   [employer] of Occupational Health - Occupational Stress Questionnaire    Feeling of Stress: Rather much  Social Connections: Moderately Integrated (01/01/2024)   Social Connection and Isolation Panel    Frequency of Communication with Friends and Family: More than three times a week    Frequency of Social Gatherings with Friends and Family: More than three times a week    Attends Religious Services: More than 4 times per year    Active Member of [employer] or Organizations: No    Attends Banker Meetings: Not on file    Marital Status: Married  Catering manager Violence: Not At Risk (12/25/2023)   Humiliation, Afraid, Rape, and Kick questionnaire    Fear of Current or Ex-Partner: No    Emotionally Abused: No    Physically Abused: No    Sexually Abused: No    FAMILY HISTORY: Family History  Problem Relation Age of Onset   Cancer Mother        leukemia   Cancer Father 78       colon   Heart disease Father    Heart attack Father 65       died from MI   Hyperlipidemia Father    Hypertension Father    Cancer Sister 31       ovarian   Depression Sister    Cancer Brother        CLL   Breast cancer Paternal Aunt        40's   Migraines Neg Hx    Thyroid  cancer Neg Hx     ALLERGIES:  is allergic to hydrocodone , prednisone , amiodarone , hydrocodone -acetaminophen , tapentadol, adhesive [tape], latex, oxycodone , tramadol , and wound dressing adhesive.  MEDICATIONS:  Current Outpatient Medications  Medication Sig Dispense Refill   acetaminophen  (TYLENOL ) 500 MG tablet Take 2 tablets (1,000 mg total) by mouth every 8 (eight) hours. 30 tablet 0   ALPRAZolam  (XANAX ) 0.25 MG tablet Take 1 tablet (0.25 mg total) by mouth daily as needed for anxiety.     bisoprolol  (ZEBETA ) 10 MG tablet TAKE 1.5 TABLETS BY MOUTH 2  TIMES DAILY. 270 tablet 3   busPIRone  (BUSPAR ) 5 MG tablet Take 1 tablet (5 mg total) by mouth 3 (three) times daily as needed. 270 tablet 3   Calcium  Carbonate (CALCIUM  500 PO) Take 1 tablet by mouth daily.     Cholecalciferol  (VITAMIN D3) 5000 units CAPS Take 5,000 Units by mouth daily.     docusate sodium  (COLACE) 100 MG capsule Take 1 capsule (100 mg total) by mouth 2 (two) times daily. 10 capsule 0   ELIQUIS  5 MG TABS tablet TAKE 1 TABLET BY MOUTH TWICE A DAY 180 tablet 1   escitalopram  (LEXAPRO ) 20 MG tablet Take 1 tablet (20 mg total) by mouth daily. 90 tablet 3   famotidine  (PEPCID ) 20 MG tablet Take 1 tablet (20 mg total) by mouth 2 (two) times daily. 60 tablet 0   furosemide  (LASIX ) 20 MG tablet TAKE 1 TABLET (20 MG TOTAL) BY MOUTH DAILY. MAY TAKE ADDITIONAL 20MG  AS NEEDED FOR LEG SWELLING 180 tablet 3   KLOR-CON  M20 20 MEQ tablet TAKE 1 TABLET BY MOUTH EVERY DAY 90 tablet 3   losartan -hydrochlorothiazide  (HYZAAR) 50-12.5 MG tablet Take 0.5 tablets by mouth daily as needed (for BP 140/80). 30 tablet 1   metoCLOPramide  (REGLAN ) 10 MG tablet Take 1 tablet (10 mg total) by mouth every 6 (six)  hours as needed. 30 tablet 0   Multiple Vitamin (MULTIVITAMIN) tablet Take 1 tablet by mouth daily.     ondansetron  (ZOFRAN ) 4 MG tablet Take 1 tablet (4 mg total) by mouth every 6 (six) hours as needed for nausea. 20 tablet 0   ranolazine  (RANEXA ) 500 MG 12 hr tablet TAKE 1 TABLET BY MOUTH TWICE A DAY 180 tablet 3   rOPINIRole  (REQUIP ) 2 MG tablet TAKE 1 TABLET BY MOUTH EVERYDAY AT BEDTIME 90 tablet 3   rosuvastatin  (CRESTOR ) 40 MG tablet TAKE 1 TABLET BY MOUTH EVERY DAY 90 tablet 3   sulfamethoxazole -trimethoprim  (BACTRIM  DS) 800-160 MG tablet TAKE 1 TABLET BY MOUTH TWICE A DAY 60 tablet 1   No current facility-administered medications for this visit.     PHYSICAL EXAMINATION: ECOG PERFORMANCE STATUS: 1 - Symptomatic but completely ambulatory Vitals:   02/15/24 1057  BP: 139/62  Pulse: (!)  59  Temp: (!) 96.6 F (35.9 C)    There were no vitals filed for this visit.    Physical Exam Constitutional:      General: She is not in acute distress.    Appearance: She is obese.  HENT:     Head: Normocephalic and atraumatic.  Eyes:     General: No scleral icterus. Cardiovascular:     Rate and Rhythm: Normal rate and regular rhythm.     Heart sounds: Normal heart sounds.  Pulmonary:     Effort: Pulmonary effort is normal. No respiratory distress.     Breath sounds: No wheezing.  Abdominal:     General: Bowel sounds are normal. There is no distension.     Palpations: Abdomen is soft.  Musculoskeletal:        General: No deformity. Normal range of motion.     Cervical back: Normal range of motion and neck supple.     Comments: 1+ edema bilaterally in lower extremity    Skin:    General: Skin is warm and dry.     Findings: No erythema or rash.  Neurological:     Mental Status: She is alert and oriented to person, place, and time. Mental status is at baseline.  Psychiatric:        Mood and Affect: Mood normal.     . LABORATORY DATA:  I have reviewed the data as listed    Latest Ref Rng & Units 02/15/2024   10:39 AM 01/18/2024   11:46 AM 12/11/2023    1:14 PM  CBC  WBC 4.0 - 10.5 K/uL 3.8  4.2  3.9   Hemoglobin 12.0 - 15.0 g/dL 87.6  88.3  88.6   Hematocrit 36.0 - 46.0 % 36.8  34.3  34.3   Platelets 150 - 400 K/uL 160  165  162       Latest Ref Rng & Units 02/15/2024   10:40 AM 01/18/2024   11:46 AM 12/11/2023    1:14 PM  CMP  Glucose 70 - 99 mg/dL 76  97  886   BUN 8 - 23 mg/dL 24  22  21    Creatinine 0.44 - 1.00 mg/dL 8.80  9.20  9.16   Sodium 135 - 145 mmol/L 140  135  138   Potassium 3.5 - 5.1 mmol/L 4.7  4.2  4.2   Chloride 98 - 111 mmol/L 103  102  107   CO2 22 - 32 mmol/L 27  23  22    Calcium  8.9 - 10.3 mg/dL 9.2  8.9  8.4  Total Protein 6.5 - 8.1 g/dL 7.1  6.8  6.7   Total Bilirubin 0.0 - 1.2 mg/dL 0.7  0.5  0.5   Alkaline Phos 38 - 126 U/L 62  70   69   AST 15 - 41 U/L 19  15  17    ALT 0 - 44 U/L 13  13  13        RADIOGRAPHIC STUDIES: I have personally reviewed the radiological images as listed and agreed with the findings in the report. No results found.

## 2024-02-15 NOTE — Assessment & Plan Note (Signed)
 Plan to get medi port removed, given her recent prosthetic joint infection.

## 2024-02-16 LAB — CANCER ANTIGEN 27.29: CA 27.29: 20.1 U/mL (ref 0.0–38.6)

## 2024-02-16 LAB — CANCER ANTIGEN 15-3: CA 15-3: 18.8 U/mL (ref 0.0–25.0)

## 2024-02-18 ENCOUNTER — Telehealth: Payer: Self-pay | Admitting: Family

## 2024-02-18 DIAGNOSIS — R899 Unspecified abnormal finding in specimens from other organs, systems and tissues: Secondary | ICD-10-CM

## 2024-02-18 NOTE — Telephone Encounter (Signed)
 Call pt  Dr babara ( oncology) let me know that renal function had slightly decreased . I suspect due to lasix  Please order a bmp and sch week of 03/04/24

## 2024-02-19 ENCOUNTER — Telehealth: Payer: Self-pay

## 2024-02-19 DIAGNOSIS — Z171 Estrogen receptor negative status [ER-]: Secondary | ICD-10-CM

## 2024-02-19 DIAGNOSIS — M51362 Other intervertebral disc degeneration, lumbar region with discogenic back pain and lower extremity pain: Secondary | ICD-10-CM | POA: Diagnosis not present

## 2024-02-19 DIAGNOSIS — Z96642 Presence of left artificial hip joint: Secondary | ICD-10-CM | POA: Diagnosis not present

## 2024-02-19 DIAGNOSIS — M48062 Spinal stenosis, lumbar region with neurogenic claudication: Secondary | ICD-10-CM | POA: Diagnosis not present

## 2024-02-19 DIAGNOSIS — M5416 Radiculopathy, lumbar region: Secondary | ICD-10-CM | POA: Diagnosis not present

## 2024-02-19 DIAGNOSIS — Z95828 Presence of other vascular implants and grafts: Secondary | ICD-10-CM

## 2024-02-19 DIAGNOSIS — M47816 Spondylosis without myelopathy or radiculopathy, lumbar region: Secondary | ICD-10-CM | POA: Diagnosis not present

## 2024-02-19 NOTE — Telephone Encounter (Signed)
 Spoke to pt informed her of message below scheduled lab appt for 03/04/24

## 2024-02-19 NOTE — Telephone Encounter (Signed)
 Per Dr. Babara, if tumor markers are WNL then ok to proceed with port removal.   Tumor markers and WNL. Referral sent to Vascular for port removal

## 2024-02-20 ENCOUNTER — Telehealth (INDEPENDENT_AMBULATORY_CARE_PROVIDER_SITE_OTHER): Payer: Self-pay

## 2024-02-20 NOTE — Telephone Encounter (Signed)
 Spoke with the patient and she is scheduled with Dr. Jama for a port removal on 02/27/24 with a 1:00 pm arrival time to the [facility]. Pre-procedure instructions were discussed and will be mailed.

## 2024-02-20 NOTE — Telephone Encounter (Signed)
 Pt scheduled for port removal on 8/19

## 2024-02-26 ENCOUNTER — Telehealth: Payer: Self-pay | Admitting: Family

## 2024-02-26 ENCOUNTER — Other Ambulatory Visit: Payer: Self-pay | Admitting: Family

## 2024-02-26 DIAGNOSIS — F419 Anxiety disorder, unspecified: Secondary | ICD-10-CM

## 2024-02-26 MED ORDER — ALPRAZOLAM 0.25 MG PO TABS: 0.2500 mg | ORAL_TABLET | Freq: Every day | ORAL | 1 refills | Status: DC | PRN

## 2024-02-26 NOTE — Telephone Encounter (Unsigned)
 Copied from CRM #[id number]. Topic: Clinical - Medication Refill >> Feb 26, 2024 11:24 AM Shereese L wrote: Medication: ALPRAZolam  (XANAX ) 0.25 MG tablet  Has the patient contacted their pharmacy? Yes (Agent: If no, request that the patient contact the pharmacy for the refill. If patient does not wish to contact the pharmacy document the reason why and proceed with request.) (Agent: If yes, when and what did the pharmacy advise?)  This is the patient's preferred pharmacy:  [id number] [address] [facility] - [address] [place] [address] Phone: [phone] Fax: [phone]  Is this the correct pharmacy for this prescription? Yes If no, delete pharmacy and type the correct one.   Has the prescription been filled recently? Yes  Is the patient out of the medication? Yes  Has the patient been seen for an appointment in the last year OR does the patient have an upcoming appointment? Yes  Can we respond through MyChart? Yes  Agent: Please be advised that Rx refills may take up to 3 business days. We ask that you follow-up with your pharmacy.

## 2024-02-26 NOTE — Telephone Encounter (Signed)
 Noted! Thank you

## 2024-02-26 NOTE — Telephone Encounter (Signed)
 Copied from CRM #[id number]. Topic: Clinical - Medication Refill >> Feb 26, 2024 11:24 AM Shereese L wrote: Medication: ALPRAZolam  (XANAX ) 0.25 MG tablet  Has the patient contacted their pharmacy? Yes (Agent: If no, request that the patient contact the pharmacy for the refill. If patient does not wish to contact the pharmacy document the reason why and proceed with request.) (Agent: If yes, when and what did the pharmacy advise?)  This is the patient's preferred pharmacy:  [id number] [address] [facility] - [address] [place] [address] Phone: [phone] Fax: [phone]  Is this the correct pharmacy for this prescription? Yes If no, delete pharmacy and type the correct one.   Has the prescription been filled recently? Yes  Is the patient out of the medication? Yes  Has the patient been seen for an appointment in the last year OR does the patient have an upcoming appointment? Yes  Can we respond through MyChart? Yes  Agent: Please be advised that Rx refills may take up to 3 business days. We ask that you follow-up with your pharmacy. >> Feb 26, 2024 11:45 AM Suzen MATSU wrote: Message has been sent to clinical staff.

## 2024-02-26 NOTE — Telephone Encounter (Signed)
 Called pt was unable to lvm will call back

## 2024-02-26 NOTE — Telephone Encounter (Signed)
 Copied from CRM #[id number]. Topic: Clinical - Medication Refill >> Feb 26, 2024 11:24 AM Shereese L wrote: Medication: ALPRAZolam  (XANAX ) 0.25 MG tablet  Has the patient contacted their pharmacy? Yes (Agent: If no, request that the patient contact the pharmacy for the refill. If patient does not wish to contact the pharmacy document the reason why and proceed with request.) (Agent: If yes, when and what did the pharmacy advise?)  This is the patient's preferred pharmacy:  [id number] [address] [facility] - [address] [place] [address] Phone: [phone] Fax: [phone]  Is this the correct pharmacy for this prescription? Yes If no, delete pharmacy and type the correct one.   Has the prescription been filled recently? Yes  Is the patient out of the medication? Yes  Has the patient been seen for an appointment in the last year OR does the patient have an upcoming appointment? Yes  Can we respond through MyChart? Yes  Agent: Please be advised that Rx refills may take up to 3 business days. We ask that you follow-up with your pharmacy.

## 2024-02-26 NOTE — Telephone Encounter (Unsigned)
 Copied from CRM #[id number]. Topic: Clinical - Medication Refill >> Feb 26, 2024 11:24 AM Shereese L wrote: Medication: ALPRAZolam  (XANAX ) 0.25 MG tablet  Has the patient contacted their pharmacy? Yes (Agent: If no, request that the patient contact the pharmacy for the refill. If patient does not wish to contact the pharmacy document the reason why and proceed with request.) (Agent: If yes, when and what did the pharmacy advise?)  This is the patient's preferred pharmacy:  [id number] [address] [facility] - [address] [place] [address] Phone: [phone] Fax: [phone]  Is this the correct pharmacy for this prescription? Yes If no, delete pharmacy and type the correct one.   Has the prescription been filled recently? Yes  Is the patient out of the medication? Yes  Has the patient been seen for an appointment in the last year OR does the patient have an upcoming appointment? Yes  Can we respond through MyChart? Yes  Agent: Please be advised that Rx refills may take up to 3 business days. We ask that you follow-up with your pharmacy. >> Feb 26, 2024 11:45 AM Suzen MATSU wrote: Message has been sent to clinical staff.

## 2024-02-27 ENCOUNTER — Encounter: Payer: Self-pay | Admitting: Vascular Surgery

## 2024-02-27 ENCOUNTER — Other Ambulatory Visit: Payer: Self-pay

## 2024-02-27 ENCOUNTER — Encounter
Admission: RE | Disposition: A | Discharge status: Home / Self Care | Payer: Self-pay | Source: Home / Self Care | Attending: Vascular Surgery

## 2024-02-27 ENCOUNTER — Ambulatory Visit
Admission: RE | Admit: 2024-02-27 | Discharge: 2024-02-27 | Disposition: A | Discharge status: Home / Self Care | Attending: Vascular Surgery | Admitting: Vascular Surgery

## 2024-02-27 DIAGNOSIS — C50411 Malignant neoplasm of upper-outer quadrant of right female breast: Secondary | ICD-10-CM

## 2024-02-27 DIAGNOSIS — Z452 Encounter for adjustment and management of vascular access device: Secondary | ICD-10-CM | POA: Diagnosis not present

## 2024-02-27 DIAGNOSIS — Z171 Estrogen receptor negative status [ER-]: Secondary | ICD-10-CM

## 2024-02-27 DIAGNOSIS — B9562 Methicillin resistant Staphylococcus aureus infection as the cause of diseases classified elsewhere: Secondary | ICD-10-CM

## 2024-02-27 DIAGNOSIS — Z9221 Personal history of antineoplastic chemotherapy: Secondary | ICD-10-CM | POA: Diagnosis not present

## 2024-02-27 DIAGNOSIS — T8454XA Infection and inflammatory reaction due to internal left knee prosthesis, initial encounter: Secondary | ICD-10-CM

## 2024-02-27 HISTORY — PX: PORTA CATH REMOVAL: CATH118286

## 2024-02-27 SURGERY — PORTA CATH REMOVAL
Anesthesia: Moderate Sedation

## 2024-02-27 MED ORDER — FAMOTIDINE 20 MG PO TABS: 40.0000 mg | ORAL_TABLET | Freq: Once | ORAL | Status: DC | PRN

## 2024-02-27 MED ORDER — METHYLPREDNISOLONE SODIUM SUCC 125 MG IJ SOLR: 125.0000 mg | Freq: Once | INTRAMUSCULAR | Status: DC | PRN

## 2024-02-27 MED ORDER — LIDOCAINE-EPINEPHRINE (PF) 1 %-1:200000 IJ SOLN
INTRAMUSCULAR | Status: DC | PRN
  Administered 2024-02-27: 20 mL

## 2024-02-27 MED ORDER — DIPHENHYDRAMINE HCL 50 MG/ML IJ SOLN: 50.0000 mg | Freq: Once | INTRAMUSCULAR | Status: DC | PRN

## 2024-02-27 MED ORDER — ONDANSETRON HCL 4 MG/2ML IJ SOLN: 4.0000 mg | Freq: Four times a day (QID) | INTRAMUSCULAR | Status: DC | PRN

## 2024-02-27 MED ORDER — CEFAZOLIN SODIUM-DEXTROSE 2-4 GM/100ML-% IV SOLN
INTRAVENOUS | Status: AC
  Filled 2024-02-27: qty 100

## 2024-02-27 MED ORDER — MIDAZOLAM HCL 2 MG/ML PO SYRP: 8.0000 mg | ORAL_SOLUTION | Freq: Once | ORAL | Status: DC | PRN

## 2024-02-27 MED ORDER — MIDAZOLAM HCL 2 MG/2ML IJ SOLN
INTRAMUSCULAR | Status: AC
  Filled 2024-02-27: qty 2

## 2024-02-27 MED ORDER — CEFAZOLIN SODIUM-DEXTROSE 2-4 GM/100ML-% IV SOLN
2.0000 g | INTRAVENOUS | Status: AC
  Administered 2024-02-27: 2 g via INTRAVENOUS

## 2024-02-27 MED ORDER — FENTANYL CITRATE (PF) 100 MCG/2ML IJ SOLN
INTRAMUSCULAR | Status: AC
  Filled 2024-02-27: qty 2

## 2024-02-27 MED ORDER — MIDAZOLAM HCL 2 MG/2ML IJ SOLN
INTRAMUSCULAR | Status: DC | PRN
  Administered 2024-02-27: 2 mg via INTRAVENOUS

## 2024-02-27 MED ORDER — FENTANYL CITRATE (PF) 100 MCG/2ML IJ SOLN
INTRAMUSCULAR | Status: DC | PRN
  Administered 2024-02-27: 50 ug via INTRAVENOUS

## 2024-02-27 MED ORDER — SODIUM CHLORIDE 0.9 % IV SOLN: INTRAVENOUS | Status: DC

## 2024-02-27 SURGICAL SUPPLY — 3 items
PACK ANGIOGRAPHY (CUSTOM PROCEDURE TRAY) IMPLANT
SUT MNCRL AB 4-0 PS2 18 (SUTURE) IMPLANT
SUT VIC AB 3-0 SH 27X BRD (SUTURE) IMPLANT

## 2024-02-27 NOTE — Op Note (Signed)
  OPERATIVE NOTE   PROCEDURE: Removal of Infuse-a-Port  PRE-OPERATIVE DIAGNOSIS: Breast cancer now chemotherapy has been completed; recent MRSA prosthetic knee infection  POST-OPERATIVE DIAGNOSIS: Same  SURGEON: [name], M.D.  ANESTHESIA: Conscious sedation was administered under my direct supervision by the interventional radiology RN. IV Versed  plus fentanyl  were utilized. Continuous ECG, pulse oximetry and blood pressure was monitored throughout the entire procedure.  Conscious sedation was for a total of 22 minutes and 35 seconds.   ESTIMATED BLOOD LOSS: Minimal   SPECIMEN(S):  Infuse-a-port intact  INDICATIONS:   Amber Barnes is a 73 y.o. y.o. female who presents with Infuse-a-Port that is not being utilized at this time.  She has completed her chemotherapy.  Recently she developed an MRSA infection of a prosthetic knee.  Patient is therefore undergoing removal of the port. The risks and benefits of been reviewed all questions answered patient agrees to proceed with port removal   DESCRIPTION: After obtaining full informed written consent, the patient is brought to special procedures and positioned supine.  The patient received IV antibiotics.  The patient was prepped and draped in the standard fashion appropriate time out is called.    After infiltrating 1% lidocaine  with epinephrine  into the soft tissues and skin surrounding the port the previous incisional scar is reopened with an 11 blade scalpel.  The port is slipped from the pocket and subsequently removed without difficulty otherwise intact.  Pressure is held at the base of the neck for 5 minutes, the pocket is irrigated.  The incision was closed using running 3-0 Vicryl for the subcutaneous layer and 4-0 Monocryl subcuticular to reapproximate the skin.  Dermabond was applied.  The patient tolerated the procedure well.  COMPLICATIONS: None  CONDITION: Good  [name], M.D. [facility] Office: [phone]   02/27/2024, 2:32 PM

## 2024-02-27 NOTE — Interval H&P Note (Signed)
 History and Physical Interval Note:  02/27/2024 1:53 PM  Amber Barnes  has presented today for surgery, with the diagnosis of Porta Cath Removal    Breast Ca.  The various methods of treatment have been discussed with the patient and family. After consideration of risks, benefits and other options for treatment, the patient has consented to  Procedure(s): PORTA CATH REMOVAL (N/A) as a surgical intervention.  The patient's history has been reviewed, patient examined, no change in status, stable for surgery.  I have reviewed the patient's chart and labs.  Questions were answered to the patient's satisfaction.     [name]

## 2024-02-27 NOTE — H&P (Signed)
 MRN : [id number]  Amber Barnes is a 73 y.o. (1950-07-26) female who presents with chief complaint of legs hurt and swell.  History of Present Illness:   Patient presents to [facility] for removal of her Infuse-a-Port.  She was last seen by her oncologist on 02/15/2024.  She has completed her chemotherapy.  Unfortunately she recently had the had an MRSA prosthetic knee infection.  Given this finding it has been recommended that her Port-A-Cath be removed.  She denies fever or chills.  She denies tenderness at the port site.  Current Meds  Medication Sig   acetaminophen  (TYLENOL ) 500 MG tablet Take 2 tablets (1,000 mg total) by mouth every 8 (eight) hours.   ALPRAZolam  (XANAX ) 0.25 MG tablet Take 1 tablet (0.25 mg total) by mouth daily as needed for anxiety.   bisoprolol  (ZEBETA ) 10 MG tablet TAKE 1.5 TABLETS BY MOUTH 2 TIMES DAILY.   busPIRone  (BUSPAR ) 5 MG tablet Take 1 tablet (5 mg total) by mouth 3 (three) times daily as needed.   Calcium  Carbonate (CALCIUM  500 PO) Take 1 tablet by mouth daily.   Cholecalciferol  (VITAMIN D3) 5000 units CAPS Take 5,000 Units by mouth daily.   docusate sodium  (COLACE) 100 MG capsule Take 1 capsule (100 mg total) by mouth 2 (two) times daily.   escitalopram  (LEXAPRO ) 20 MG tablet Take 1 tablet (20 mg total) by mouth daily.   furosemide  (LASIX ) 20 MG tablet TAKE 1 TABLET (20 MG TOTAL) BY MOUTH DAILY. MAY TAKE ADDITIONAL 20MG  AS NEEDED FOR LEG SWELLING   KLOR-CON  M20 20 MEQ tablet TAKE 1 TABLET BY MOUTH EVERY DAY   metoCLOPramide  (REGLAN ) 10 MG tablet Take 1 tablet (10 mg total) by mouth every 6 (six) hours as needed.   Multiple Vitamin (MULTIVITAMIN) tablet Take 1 tablet by mouth daily.   ranolazine  (RANEXA ) 500 MG 12 hr tablet TAKE 1 TABLET BY MOUTH TWICE A DAY   rOPINIRole  (REQUIP ) 2 MG tablet TAKE 1 TABLET BY MOUTH EVERYDAY AT BEDTIME   rosuvastatin  (CRESTOR ) 40 MG tablet TAKE 1 TABLET BY MOUTH EVERY DAY    sulfamethoxazole -trimethoprim  (BACTRIM  DS) 800-160 MG tablet TAKE 1 TABLET BY MOUTH TWICE A DAY    Past Medical History:  Diagnosis Date   Achilles tendinitis    Acute medial meniscal tear    Allergy    Anemia    Anxiety    a.) on BZO PRN (alprazolam )   Aortic atherosclerosis (HCC)    Ascending aorta dilatation (HCC) 10/20/2022   a.) TTE 10/20/2022: asc Ao 39 mm   Atrial fibrillation/flutter (HCC) 07/2016   a.) Dx'd 07/2016; b.) CHA2DS2-VASc = 5 (age, sex, HFmrEF, HTN, vasc disease history) as of 05/09/2023; c.) s/p DCCV 09/07/2016 (150 J x 2 --> A.flutter; 200 J x 1 --> NSR); d.) s/p DCCV 10/19/2016 (150 J x 1 --> NSR); e.) brought in for DCCV 12/2016; cancelled d/t NSR; f.) s/p PVI ablation 12/12/2022; g.) cardiac rate/rhythm maintained on oral bisoprolol ; chronically anticoagulated using apixaban    BMI 50.0-59.9, adult (HCC) 07/16/2018   CAD (coronary artery disease)    a.) cCTA 12/08/2022: Ca2+ = 31.6 (58th %'ile for age/sex/race match control)   Cardiomegaly    Chemotherapy induced nausea and vomiting 05/18/2021   Chronic heart failure with mid-range ejection fraction (HFmrEF) (HCC)    a.) TTE 03/08/21: EF 60-65, mild LVH, G1DD, mild LAE, triv MR; b.) TTE 07/29/21: EF 60-65, G1DD, mild LAE, triv MR; c.) TTE 11/26/21: EF  55-60, G1DD, mild LAE, mild MR; d.) TTE 02/25/22: EF 60-65, G1DD, mild LAE, mild MR; e.) TTE 05/31/22: EF 55-60, mild LVH, G2DD, mild-mod MR; f.) TTE 10/20/22: EF 45-50, mild LAE, mod red RVSF, mild-mod TR, Asc Ao 39; g.) TTE 12/13/22: EF 50-55, mild LVH, mild MAC   CKD (chronic kidney disease), stage II    Colon polyp    Complication of anesthesia    a.) emergence delirium; b.) hypoxia after hip replacement   COPD (chronic obstructive pulmonary disease) (HCC)    DDD (degenerative disc disease), lumbar    a.) s/p fusion   Depression    Endometriosis    Family history of CLL (chronic lymphoid leukemia) 03/21/2020   Fibrocystic breast disease    GERD (gastroesophageal  reflux disease)    History of kidney stones    History of stress test    a.) MV 07/29/2016: EF 55-65%. Small, mild defect  in mid anteroseptal, apical septal, and apical locations. No ischemia-->low risk; b.) MV 10/2022: mod sized partially rev apical ant, apical septal, and apical defect. EF 52%. No signific cor Ca2+; c.) cCTA 12/08/2022: Ca2+ = 31.6 (58th %'ile).   History of tobacco use    Hyperlipidemia    Hypertension    Hypothyroidism    a.) s/p partial thyroidectomy for nodule; pathology benign   Insomnia    Left knee pain, unspecified chronicity    Lipoma    Malignant neoplasm of upper-outer quadrant of right female breast (HCC) 03/03/2021   a.) stage 1A invasive mammary carcinoma (cT1b or T1c N0); ER (-), PR (+), HER2/neu (+); did not tolerate adjuvant chemotherapy (paclitacel + trastuzumab ) despite paclitaxel  dose reduction; s/p adjuvant XRT; cinished maintenance trastuzumab  07/27/2022; no adjuvant endocrine therapy as no clear benefit   Migraine    Nephrolithiasis    On apixaban  therapy    OSA (obstructive sleep apnea)    a.) non-compliant with nocturnal PAP therapy   Osteoarthritis    Osteopenia    PAF (paroxysmal atrial fibrillation) (HCC) 12/13/2022   Peptic ulcer disease    a.) H. pylori   Pericarditis 12/12/2022   a.) following A.fib ablation; resolved   Pneumonia    Port-A-Cath in place    Primary localized osteoarthritis of pelvic region and thigh 03/02/2022   RLS (restless legs syndrome)    a.) on ropinirole    S/P TKR (total knee replacement), left 09/11/2023   S/P total left hip arthroplasty 05/11/2023   Vertigo 06/08/2016   Vitamin D  deficiency     Past Surgical History:  Procedure Laterality Date   ABDOMINAL HYSTERECTOMY     ovaries left   ACHILLES TENDON SURGERY     2012,01/2017   ATRIAL FIBRILLATION ABLATION N/A 12/12/2022   Procedure: ATRIAL FIBRILLATION ABLATION;  Surgeon: Cindie Ole DASEN, MD;  Location: [facility];  Service:  Cardiovascular;  Laterality: N/A;   BREAST BIOPSY Right 01/15/2021   Affirm biopsy-X clip-INVASIVE MAMMARY CARCINOMA   BREAST BIOPSY Right 01/15/2021   u/s bx-venus clip INVASIVE MAMMARY CARCINOMA   BREAST BIOPSY Right 01/11/2024   us  bx x 2. 10:00 13cfm venus marker, 1:00 9 cmfn ribbon marker, paths pending   BREAST BIOPSY Right 01/04/2023   u/s bx ribbon clip path pending   BREAST BIOPSY Right 01/04/2023   US  RT BREAST BX W LOC DEV 1ST LESION IMG BX SPEC US  GUIDE 01/04/2023 [facility]   BREAST BIOPSY Right 01/11/2024   US  RT BREAST BX W LOC DEV EA ADD LESION IMG BX SPEC US   GUIDE 01/11/2024 [facility]   BREAST BIOPSY Right 01/11/2024   US  RT BREAST BX W LOC DEV 1ST LESION IMG BX SPEC US  GUIDE 01/11/2024 [facility]   BREAST LUMPECTOMY,RADIO FREQ LOCALIZER,AXILLARY SENTINEL LYMPH NODE BIOPSY Right 02/17/2021   Procedure: BREAST LUMPECTOMY,RADIO FREQ LOCALIZER,AXILLARY SENTINEL LYMPH NODE BIOPSY;  Surgeon: Lane Shope, MD;  Location: [facility];  Service: General;  Laterality: Right;   CARDIOVERSION N/A 09/07/2016   Procedure: CARDIOVERSION;  Surgeon: Evalene JINNY Lunger, MD;  Location: [facility];  Service: Cardiovascular;  Laterality: N/A;   CARDIOVERSION N/A 10/19/2016   Procedure: CARDIOVERSION;  Surgeon: Evalene JINNY Lunger, MD;  Location: [facility];  Service: Cardiovascular;  Laterality: N/A;   CARDIOVERSION N/A 12/14/2016   Procedure: CARDIOVERSION;  Surgeon: Lunger Evalene JINNY, MD;  Location: [facility];  Service: Cardiovascular;  Laterality: N/A;   COLONOSCOPY     COLONOSCOPY WITH PROPOFOL  N/A 05/13/2020   Procedure: COLONOSCOPY WITH PROPOFOL ;  Surgeon: Toledo, Ladell POUR, MD;  Location: [facility];  Service: Gastroenterology;  Laterality: N/A;  ELIQUIS    CYSTOSCOPY WITH STENT PLACEMENT Bilateral 07/12/2021   Procedure: CYSTOSCOPY WITH STENT PLACEMENT;  Surgeon: Francisca Redell BROCKS, MD;  Location: [facility];  Service: Urology;  Laterality: Bilateral;    CYSTOSCOPY/URETEROSCOPY/HOLMIUM LASER/STENT PLACEMENT Bilateral 07/30/2021   Procedure: CYSTOSCOPY/URETEROSCOPY/HOLMIUM LASER/BILATERAL STENT REMOVAL;  Surgeon: Francisca Redell BROCKS, MD;  Location: [facility];  Service: Urology;  Laterality: Bilateral;   ESOPHAGOGASTRODUODENOSCOPY     ESOPHAGOGASTRODUODENOSCOPY (EGD) WITH PROPOFOL  N/A 05/13/2020   Procedure: ESOPHAGOGASTRODUODENOSCOPY (EGD) WITH PROPOFOL ;  Surgeon: Toledo, Ladell POUR, MD;  Location: [facility];  Service: Gastroenterology;  Laterality: N/A;   EXCISIONAL TOTAL HIP ARTHROPLASTY WITH ANTIBIOTIC SPACERS Left 10/17/2023   Procedure: EXCISIONAL TOTAL HIP ARTHROPLASTY WITH PLACEMENT OF ANTIBIOTIC BEADS AND DRAIN;  Surgeon: Lorelle Hussar, MD;  Location: [facility];  Service: Orthopedics;  Laterality: Left;   FOOT SURGERY Left    tendon   LUMBAR FUSION     PORTA CATH INSERTION N/A 03/23/2021   Procedure: PORTA CATH INSERTION;  Surgeon: Jama Cordella MATSU, MD;  Location: [facility];  Service: Cardiovascular;  Laterality: N/A;   PORTACATH PLACEMENT N/A 03/23/2021   Procedure: ATTEMPTED INSERTION PORT-A-CATH;  Surgeon: Lane Shope, MD;  Location: [facility];  Service: General;  Laterality: N/A;   THYROIDECTOMY  1990   half thyroid  lobectomy secondary to a benign nodule   TOTAL HIP ARTHROPLASTY Right 08/21/2018   Procedure: TOTAL HIP ARTHROPLASTY ANTERIOR APPROACH-RIGHT CELL SAVER REQUESTED;  Surgeon: Kathlynn Sharper, MD;  Location: [facility];  Service: Orthopedics;  Laterality: Right;   TOTAL HIP ARTHROPLASTY Left 05/11/2023   Procedure: TOTAL HIP ARTHROPLASTY ANTERIOR APPROACH;  Surgeon: Lorelle Hussar, MD;  Location: [facility];  Service: Orthopedics;  Laterality: Left;   TOTAL KNEE ARTHROPLASTY Left 09/11/2023   Procedure: ARTHROPLASTY, KNEE, TOTAL;  Surgeon: Lorelle Hussar, MD;  Location: [facility];  Service: Orthopedics;  Laterality: Left;    Social History Social History   Tobacco Use   Smoking status: Former     Current packs/day: 0.00    Average packs/day: 0.3 packs/day for 10.0 years (2.5 ttl pk-yrs)    Types: Cigarettes    Start date: 85    Quit date: 2005    Years since quitting: 20.6    Passive exposure: Past   Smokeless tobacco: Never   Tobacco comments:    smoked for 10 years, 1 pack every 2-3 days  Vaping Use   Vaping status: Never Used  Substance Use Topics   Alcohol use: Yes  Alcohol/week: 1.0 standard drink of alcohol    Types: 1 Glasses of wine per week    Comment: margarita qd   Drug use: Not Currently    Family History Family History  Problem Relation Age of Onset   Cancer Mother        leukemia   Cancer Father 6       colon   Heart disease Father    Heart attack Father 9       died from MI   Hyperlipidemia Father    Hypertension Father    Cancer Sister 54       ovarian   Depression Sister    Cancer Brother        CLL   Breast cancer Paternal Aunt        40's   Migraines Neg Hx    Thyroid  cancer Neg Hx     Allergies  Allergen Reactions   Hydrocodone     Prednisone  Other (See Comments)    Agitation, hyperactivity, polyphagia   Amiodarone  Nausea And Vomiting and Other (See Comments)   Hydrocodone -Acetaminophen  Other (See Comments)    Hallucinations and combative   Tapentadol Itching   Adhesive [Tape] Itching and Rash    Prefers paper tape   Latex Itching and Rash    use paper tap   Oxycodone  Itching    Can take it with benadryl    Tramadol  Itching    Can take it with benadryl    Wound Dressing Adhesive      REVIEW OF SYSTEMS (Negative unless checked)  Constitutional: [] Weight loss  [] Fever  [] Chills Cardiac: [] Chest pain   [] Chest pressure   [] Palpitations   [] Shortness of breath when laying flat   [] Shortness of breath with exertion. Vascular:  [] Pain in legs with walking   [x] Pain in legs at rest  [] History of DVT   [] Phlebitis   [x] Swelling in legs   [] Varicose veins   [] Non-healing ulcers Pulmonary:   [] Uses home oxygen   [] Productive  cough   [] Hemoptysis   [] Wheeze  [] COPD   [] Asthma Neurologic:  [] Dizziness   [] Seizures   [] History of stroke   [] History of TIA  [] Aphasia   [] Vissual changes   [] Weakness or numbness in arm   [] Weakness or numbness in leg Musculoskeletal:   [] Joint swelling   [] Joint pain   [] Low back pain Hematologic:  [] Easy bruising  [] Easy bleeding   [] Hypercoagulable state   [] Anemic Gastrointestinal:  [] Diarrhea   [] Vomiting  [] Gastroesophageal reflux/heartburn   [] Difficulty swallowing. Genitourinary:  [] Chronic kidney disease   [] Difficult urination  [] Frequent urination   [] Blood in urine Skin:  [] Rashes   [] Ulcers  Psychological:  [] History of anxiety   []  History of major depression.  Physical Examination  Vitals:   02/27/24 1342  BP: (!) 172/78  Pulse: (!) 59  Resp: 20  Temp: 98.3 F (36.8 C)  TempSrc: Temporal  SpO2: 99%  Weight: 111.1 kg  Height: 5' 5 (1.651 m)   Body mass index is 40.77 kg/m. Gen: WD/WN, NAD Head: [place]/AT, No temporalis wasting.  Ear/Nose/Throat: Hearing grossly intact, nares w/o erythema or drainage, pinna without lesions Eyes: PER, EOMI, sclera nonicteric.  Neck: Supple, no gross masses.  No JVD.  Pulmonary:  Good air movement, no audible wheezing, no use of accessory muscles.  Cardiac: RRR, precordium not hyperdynamic. Vascular: Port is nontender no findings consistent with infection Vessel Right Left  Radial Palpable Palpable  Gastrointestinal: soft, non-distended. No guarding/no peritoneal signs.  Musculoskeletal: M/S  5/5 throughout.  No deformity.  Neurologic: CN 2-12 intact. Pain and light touch intact in extremities.  Symmetrical.  Speech is fluent. Motor exam as listed above. Psychiatric: Judgment intact, Mood & affect appropriate for pt's clinical situation. Dermatologic: Venous rashes no ulcers noted.  No changes consistent with cellulitis. Lymph : No lichenification or skin changes of chronic lymphedema.  CBC Lab Results  Component Value Date    WBC 3.8 (L) 02/15/2024   HGB 12.3 02/15/2024   HCT 36.8 02/15/2024   MCV 93.4 02/15/2024   PLT 160 02/15/2024    BMET    Component Value Date/Time   NA 140 02/15/2024 1040   NA 143 02/27/2018 1520   K 4.7 02/15/2024 1040   CL 103 02/15/2024 1040   CO2 27 02/15/2024 1040   GLUCOSE 76 02/15/2024 1040   BUN 24 (H) 02/15/2024 1040   BUN 17 02/27/2018 1520   CREATININE 1.19 (H) 02/15/2024 1040   CREATININE 0.65 05/14/2013 0851   CALCIUM  9.2 02/15/2024 1040   GFRNONAA 49 (L) 02/15/2024 1040   GFRAA >60 11/13/2019 2229   Estimated Creatinine Clearance: 53.8 mL/min (A) (by C-G formula based on SCr of 1.19 mg/dL (H)).  COAG Lab Results  Component Value Date   INR 1.2 12/13/2022   INR 1.3 (H) 04/06/2021   INR 1.10 08/08/2018    Radiology No results found.   Assessment/Plan Infuse-a-Port currently not being used secondary to completion of her chemotherapy for breast cancer. Patient is no longer requiring her Infuse-a-Port and given her recent prosthetic knee infection the port should be removed to prevent future septic complications.  Risks and benefits were reviewed all questions were answered patient agrees to proceed.   [name], MD  02/27/2024 1:48 PM

## 2024-02-28 DIAGNOSIS — Z96642 Presence of left artificial hip joint: Secondary | ICD-10-CM | POA: Diagnosis not present

## 2024-03-04 ENCOUNTER — Other Ambulatory Visit (INDEPENDENT_AMBULATORY_CARE_PROVIDER_SITE_OTHER)

## 2024-03-04 ENCOUNTER — Other Ambulatory Visit

## 2024-03-04 DIAGNOSIS — M5416 Radiculopathy, lumbar region: Secondary | ICD-10-CM | POA: Diagnosis not present

## 2024-03-04 DIAGNOSIS — M5126 Other intervertebral disc displacement, lumbar region: Secondary | ICD-10-CM | POA: Diagnosis not present

## 2024-03-04 DIAGNOSIS — R899 Unspecified abnormal finding in specimens from other organs, systems and tissues: Secondary | ICD-10-CM

## 2024-03-04 DIAGNOSIS — M48062 Spinal stenosis, lumbar region with neurogenic claudication: Secondary | ICD-10-CM | POA: Diagnosis not present

## 2024-03-04 LAB — BASIC METABOLIC PANEL WITH GFR
BUN: 18 mg/dL (ref 6–23)
CO2: 29 meq/L (ref 19–32)
Calcium: 8.3 mg/dL — ABNORMAL LOW (ref 8.4–10.5)
Chloride: 103 meq/L (ref 96–112)
Creatinine, Ser: 0.8 mg/dL (ref 0.40–1.20)
GFR: 74.22 mL/min (ref >60.00)
Glucose, Bld: 85 mg/dL (ref 70–99)
Potassium: 4.1 meq/L (ref 3.5–5.1)
Sodium: 142 meq/L (ref 135–145)

## 2024-03-05 ENCOUNTER — Encounter: Payer: Self-pay | Admitting: Oncology

## 2024-03-12 ENCOUNTER — Ambulatory Visit: Payer: Self-pay | Admitting: Family

## 2024-03-16 ENCOUNTER — Other Ambulatory Visit: Payer: Self-pay | Admitting: Infectious Diseases

## 2024-03-19 ENCOUNTER — Other Ambulatory Visit (INDEPENDENT_AMBULATORY_CARE_PROVIDER_SITE_OTHER)

## 2024-03-19 DIAGNOSIS — M5416 Radiculopathy, lumbar region: Secondary | ICD-10-CM | POA: Diagnosis not present

## 2024-03-19 DIAGNOSIS — M5126 Other intervertebral disc displacement, lumbar region: Secondary | ICD-10-CM | POA: Diagnosis not present

## 2024-03-19 LAB — MAGNESIUM: Magnesium: 2.1 mg/dL (ref 1.5–2.5)

## 2024-03-19 LAB — VITAMIN D 25 HYDROXY (VIT D DEFICIENCY, FRACTURES): VITD: 40.12 ng/mL (ref 30.00–100.00)

## 2024-03-20 LAB — PTH, INTACT (ICMA) AND IONIZED CALCIUM
Calcium, Ion: 4.9 mg/dL (ref 4.7–5.5)
Calcium: 8.9 mg/dL (ref 8.6–10.4)
PTH: 51 pg/mL (ref 16–77)

## 2024-03-20 LAB — EXTRA SPECIMEN

## 2024-03-21 ENCOUNTER — Ambulatory Visit: Payer: Self-pay | Admitting: Family

## 2024-04-02 ENCOUNTER — Encounter: Payer: Self-pay | Admitting: Family

## 2024-04-02 ENCOUNTER — Ambulatory Visit: Admitting: Family

## 2024-04-02 VITALS — BP 128/78 | HR 60 | Temp 97.8°F | Ht 65.0 in | Wt 254.6 lb

## 2024-04-02 DIAGNOSIS — Z23 Encounter for immunization: Secondary | ICD-10-CM | POA: Diagnosis not present

## 2024-04-02 DIAGNOSIS — I10 Essential (primary) hypertension: Secondary | ICD-10-CM

## 2024-04-02 DIAGNOSIS — F419 Anxiety disorder, unspecified: Secondary | ICD-10-CM

## 2024-04-02 DIAGNOSIS — F32A Depression, unspecified: Secondary | ICD-10-CM

## 2024-04-02 MED ORDER — DOCUSATE SODIUM 100 MG PO CAPS: 100.0000 mg | ORAL_CAPSULE | Freq: Two times a day (BID) | ORAL | Status: AC

## 2024-04-02 NOTE — Progress Notes (Signed)
 Assessment & Plan:  Need for influenza vaccination -     Flu vaccine HIGH DOSE PF(Fluzone Trivalent)  Anxiety and depression Assessment & Plan: Chronic, stable.  Continue Lexapro  20 mg daily, and Xanax  0.25 mg daily as needed.   Primary hypertension Assessment & Plan: Chronic, stable.  Continue losartan -hydrochlorothiazide  50-12.5 mg as needed blood pressure greater than 140/80.   Other orders -     Docusate Sodium ; Take 1 capsule (100 mg total) by mouth 2 (two) times daily.     Return precautions given.   Risks, benefits, and alternatives of the medications and treatment plan prescribed today were discussed, and patient expressed understanding.   Education regarding symptom management and diagnosis given to patient on AVS either electronically or printed.  No follow-ups on file.  Amber Northern, FNP  Subjective:    Patient ID: Amber Barnes, female    DOB: [birth year], 73 y.o.   MRN: [id number]  CC: Amber Barnes is a 73 y.o. female who presents today for follow up.   HPI: HPI Discussed the use of AI scribe software for clinical note transcription with the patient, who gave verbal consent to proceed.  History of Present Illness   Amber Barnes is a 73 year old female who presents for follow-up of groin and low back pain.  She recently received an epidural injection, which has significantly improved her ability to walk and perform daily activities such as cooking and cleaning. The pain relief was almost immediate, allowing her to engage in activities like shopping for leisure. She is currently working on building her leg strength and stamina.  She is no longer on bactrim  and is scheduled for infectious disease follow-up next month.   She experiences occasional sleep disturbances and uses Xanax , 0.25 mg prn, primarily at night to aid sleep. She has been managing her mood and anxiety well, despite a recent bereavement in the family. Feels well on  Lexapro  20 mg daily.   consult 03/04/2024 herniated nucleus pulposis, lumbar radiculitis, left groin pain Dr. Avanell.  Advised Tylenol  652 tablets twice daily as needed.  Tramadol  50 mg half tablet to 1 tablet nightly as needed.  S/p ESI. Follow-up infectious disease 04/18/2024;   Allergies: Hydrocodone , Prednisone , Amiodarone , Hydrocodone -acetaminophen , Tapentadol, Adhesive [tape], Latex, Oxycodone , Tramadol , and Wound dressing adhesive Current Outpatient Medications on File Prior to Visit  Medication Sig Dispense Refill   acetaminophen  (TYLENOL ) 500 MG tablet Take 2 tablets (1,000 mg total) by mouth every 8 (eight) hours. 30 tablet 0   ALPRAZolam  (XANAX ) 0.25 MG tablet Take 1 tablet (0.25 mg total) by mouth daily as needed for anxiety. 30 tablet 1   bisoprolol  (ZEBETA ) 10 MG tablet TAKE 1.5 TABLETS BY MOUTH 2 TIMES DAILY. 270 tablet 3   busPIRone  (BUSPAR ) 5 MG tablet Take 1 tablet (5 mg total) by mouth 3 (three) times daily as needed. 270 tablet 3   Calcium  Carbonate (CALCIUM  500 PO) Take 1 tablet by mouth daily.     Cholecalciferol  (VITAMIN D3) 5000 units CAPS Take 5,000 Units by mouth daily.     ELIQUIS  5 MG TABS tablet TAKE 1 TABLET BY MOUTH TWICE A DAY 180 tablet 1   escitalopram  (LEXAPRO ) 20 MG tablet Take 1 tablet (20 mg total) by mouth daily. 90 tablet 3   famotidine  (PEPCID ) 20 MG tablet Take 1 tablet (20 mg total) by mouth 2 (two) times daily. 60 tablet 0   furosemide  (LASIX ) 20 MG tablet TAKE 1 TABLET (20 MG TOTAL) BY MOUTH DAILY.  MAY TAKE ADDITIONAL 20MG  AS NEEDED FOR LEG SWELLING 180 tablet 3   KLOR-CON  M20 20 MEQ tablet TAKE 1 TABLET BY MOUTH EVERY DAY 90 tablet 3   losartan -hydrochlorothiazide  (HYZAAR) 50-12.5 MG tablet Take 0.5 tablets by mouth daily as needed (for BP 140/80). 30 tablet 1   metoCLOPramide  (REGLAN ) 10 MG tablet Take 1 tablet (10 mg total) by mouth every 6 (six) hours as needed. 30 tablet 0   Multiple Vitamin (MULTIVITAMIN) tablet Take 1 tablet by mouth daily.      ondansetron  (ZOFRAN ) 4 MG tablet Take 1 tablet (4 mg total) by mouth every 6 (six) hours as needed for nausea. 20 tablet 0   ranolazine  (RANEXA ) 500 MG 12 hr tablet TAKE 1 TABLET BY MOUTH TWICE A DAY 180 tablet 3   rOPINIRole  (REQUIP ) 2 MG tablet TAKE 1 TABLET BY MOUTH EVERYDAY AT BEDTIME 90 tablet 3   rosuvastatin  (CRESTOR ) 40 MG tablet TAKE 1 TABLET BY MOUTH EVERY DAY 90 tablet 3   sulfamethoxazole -trimethoprim  (BACTRIM  DS) 800-160 MG tablet TAKE 1 TABLET BY MOUTH TWICE A DAY 60 tablet 1   No current facility-administered medications on file prior to visit.    Review of Systems  Constitutional:  Negative for chills and fever.  Respiratory:  Negative for cough.   Cardiovascular:  Negative for chest pain and palpitations.  Gastrointestinal:  Negative for nausea and vomiting.      Objective:    BP 128/78   Pulse 60   Temp 97.8 F (36.6 C) (Oral)   Ht 5' 5 (1.651 m)   Wt 254 lb 9.6 oz (115.5 kg)   SpO2 98%   BMI 42.37 kg/m  BP Readings from Last 3 Encounters:  04/02/24 128/78  02/27/24 (!) 157/59  02/15/24 139/62   Wt Readings from Last 3 Encounters:  04/02/24 254 lb 9.6 oz (115.5 kg)  02/27/24 245 lb (111.1 kg)  01/18/24 249 lb (112.9 kg)      04/02/2024    9:35 AM 01/01/2024   10:33 AM 12/25/2023    9:01 AM  Depression screen PHQ 2/9  Decreased Interest 0 0 0  Down, Depressed, Hopeless 0 1 0  PHQ - 2 Score 0 1 0  Altered sleeping 1 1 1   Tired, decreased energy 1 1 0  Change in appetite 0 1 0  Feeling bad or failure about yourself  0 2 0  Trouble concentrating 0 1 0  Moving slowly or fidgety/restless 0 0 0  Suicidal thoughts 0 0 0  PHQ-9 Score 2 7 1   Difficult doing work/chores Not difficult at all Somewhat difficult Somewhat difficult      04/02/2024    9:36 AM 01/01/2024   10:33 AM 11/30/2023    9:08 AM 03/14/2023    9:09 AM  GAD 7 : Generalized Anxiety Score  Nervous, Anxious, on Edge 0 1 3 2   Control/stop worrying 0 1 3 1   Worry too much - different  things 0 1 3 1   Trouble relaxing 0 1 3 1   Restless 0 1 3 1   Easily annoyed or irritable 0 1 3 1   Afraid - awful might happen 0 0 0 1  Total GAD 7 Score 0 6 18 8   Anxiety Difficulty Not difficult at all Somewhat difficult Very difficult Somewhat difficult      Physical Exam Vitals reviewed.  Constitutional:      Appearance: She is well-developed.  Eyes:     Conjunctiva/sclera: Conjunctivae normal.  Cardiovascular:  Rate and Rhythm: Normal rate and regular rhythm.     Pulses: Normal pulses.     Heart sounds: Normal heart sounds.  Pulmonary:     Effort: Pulmonary effort is normal.     Breath sounds: Normal breath sounds. No wheezing, rhonchi or rales.  Skin:    General: Skin is warm and dry.  Neurological:     Mental Status: She is alert.  Psychiatric:        Speech: Speech normal.        Behavior: Behavior normal.        Thought Content: Thought content normal.

## 2024-04-02 NOTE — Patient Instructions (Signed)
 Nice to see you today!

## 2024-04-02 NOTE — Assessment & Plan Note (Signed)
 Chronic, stable.  Continue losartan -hydrochlorothiazide  50-12.5 mg as needed blood pressure greater than 140/80.

## 2024-04-02 NOTE — Assessment & Plan Note (Signed)
 Chronic, stable.  Continue Lexapro  20 mg daily, and Xanax  0.25 mg daily as needed.

## 2024-04-15 DIAGNOSIS — M48062 Spinal stenosis, lumbar region with neurogenic claudication: Secondary | ICD-10-CM | POA: Diagnosis not present

## 2024-04-15 DIAGNOSIS — M5416 Radiculopathy, lumbar region: Secondary | ICD-10-CM | POA: Diagnosis not present

## 2024-04-15 DIAGNOSIS — M5126 Other intervertebral disc displacement, lumbar region: Secondary | ICD-10-CM | POA: Diagnosis not present

## 2024-04-15 NOTE — Progress Notes (Signed)
 HPI The patient is a pleasant 74 year old female who presents today for follow-up of left groin pain with radiation to the knee.  Her symptoms began in the spring 2025.  She is status post left hip replacement in October 2020 for.  On 10/17/23 she underwent I&D for infected total hip.  She completed her antibiotics and her port was removed.  Although she has had some pain she was getting a stretch treatment at [facility] when she had significant flaring of left groin pain radiating along the anterior thigh to the knee.  Her symptoms are worsened with standing, walking, bending, lifting, twisting, and sitting.  Nothing provides relief.  Her pain is rated 8/10.  Medications have included prednisone  (low-dose taper) which provided mild to moderate relief.  Oxycodone  is poorly tolerated due to nausea, constipation, and itching.  Tramadol  provides mild to moderate relief.  She sleeps poorly at night but indicates that this is due to my brain not shutting off.  She takes Xanax  nightly which assist with sleep.  Medications include Eliquis  (atrial fibrillation).  She has a history of urgency stress incontinence.  She has a history of bilateral Achilles tendon surgeries.  She was initially evaluated on 03/04/2024 at which time she continued with Tylenol  650 mg 2 tablets twice daily, tramadol  50 mg nightly as needed, and she was scheduled for left L2-3 and left L3-4 transforaminal ESI.  Today she reports over 90% relief following the ESI.  She has been able to increase her activity level.  She went on vacation, slipped on a soft bed, and had a very temporary flare of pain.  She has mild discomfort with chores such as laundry.  Overall she is doing much better and has not needed tramadol  since her last injection.  She would like to be able to call for another ESI if needed.   The [place]  Narcotic Database was reviewed today.   Procedures:  03/19/2024: Left L2-3 and left L3-4 transforaminal ESI (over  90% relief)   Past Medical History:  Diagnosis Date  . Atrial fibrillation (CMS/HHS-HCC)   . Colon polyp   . Cystic breast   . Depression   . Endometriosis   . Hyperlipidemia   . Hypertension     Past Surgical History:  Procedure Laterality Date  . ARTHROPLASTY HIP TOTAL Right 08/21/2018  . COLONOSCOPY  05/13/2020   Tubular adenomas/Repeat 56yrs/TKT  . EGD  05/13/2020   Normal EGD biopsies/Esophageal stenosis. Dilated/No repeat needed/TKT  . back surgery    . COLONOSCOPY    . foot surgery    . HYSTERECTOMY VAGINAL    . thyroid  sugery    . UPPER GASTROINTESTINAL ENDOSCOPY      Social History   Socioeconomic History  . Marital status: Married  Occupational History  . Occupation: Retired  Tobacco Use  . Smoking status: Former  . Smokeless tobacco: Never  . Tobacco comments:    quit 20 years ago.   Vaping Use  . Vaping status: Never Used  Substance and Sexual Activity  . Alcohol use: Yes    Comment: socially   . Drug use: Never   Social Drivers of Corporate investment banker Strain: Low Risk  (03/04/2024)   Overall Financial Resource Strain (CARDIA)   . Difficulty of Paying Living Expenses: Not very hard  Food Insecurity: No Food Insecurity (03/04/2024)   Hunger Vital Sign   . Worried About Programme researcher, broadcasting/film/video in the Last Year: Never true   . Ran Out  of Food in the Last Year: Never true  Transportation Needs: No Transportation Needs (03/04/2024)   PRAPARE - Transportation   . Lack of Transportation (Medical): No   . Lack of Transportation (Non-Medical): No    Current Outpatient Medications on File Prior to Visit  Medication Sig Dispense Refill  . acetaminophen  (TYLENOL ) 500 MG tablet Take 1,000 mg by mouth every 8 (eight) hours    . ALPRAZolam  (XANAX ) 0.25 MG tablet Take by mouth Take 1 tablet (0.25 mg total) by mouth daily as needed for anxiety.    . AMIOdarone  (PACERONE ) 200 MG tablet Take 200 mg by mouth once daily      . amoxicillin  (AMOXIL ) 500 MG  capsule TAKE 4 CAPSULES BY MOUTH ONCE FOR 1 DOSE TAKE 30 MIN TO 1 HOUR PRIOR TO DENTAL CLEANING OR PROCEDURE    . apixaban  (ELIQUIS ) 5 mg tablet Take 1 tablet by mouth 2 (two) times daily    . b complex vitamins capsule Take 1 capsule by mouth once daily      . bisoprolol  (ZEBETA ) 10 MG tablet Take 10 mg by mouth 2 (two) times daily    . bisoprolol  (ZEBETA ) 5 MG tablet Take by mouth Take 0.5 tablets (2.5 mg total) by mouth daily.    . busPIRone  (BUSPAR ) 5 MG tablet Take 1 tablet by mouth 3 (three) times daily as needed    . calcium  carbonate (CALCIUM  500 ORAL) 1 tablet daily    . cloNIDine  HCL (CATAPRES ) 0.1 MG tablet Take 1 tablet by mouth 2 (two) times daily    . DAPTOmycin  (CUBICIN ) injection     . docusate (COLACE) 100 MG capsule Take 100 mg by mouth 2 (two) times daily    . DULoxetine  (CYMBALTA ) 30 MG DR capsule TK 3 CS PO QD    . escitalopram  oxalate (LEXAPRO ) 10 MG tablet Take 1 tablet by mouth once daily    . esomeprazole (NEXIUM) 20 MG DR capsule TAKE 1 CAPSULE BY MOUTH TWICE A DAY 180 capsule 3  . flecainide  (TAMBOCOR ) 100 MG tablet Take 100 mg by mouth 2 (two) times daily    . FLUoxetine  (PROZAC ) 20 MG capsule Take 20 mg by mouth every morning    . FUROsemide  (LASIX ) 20 MG tablet Take 20 mg by mouth once daily      . ibuprofen  (MOTRIN ) 600 MG tablet TAKE 1 TABLET BY MOUTH TWICE A DAY AS NEEDED FOR CHEST PAIN    . lidocaine -prilocaine  (EMLA ) cream     . losartan  (COZAAR ) 25 MG tablet     . losartan -hydroCHLOROthiazide  (HYZAAR) 50-12.5 mg tablet Take 1 tablet by mouth once daily    . metFORMIN  (GLUCOPHAGE -XR) 500 MG XR tablet TAKE 1 TABLET BY MOUTH IN THE MORNING AND 2 TABLETS BY MOUTH AT NIGHT    . methylPREDNISolone  (MEDROL  DOSEPACK) 4 mg tablet Follow package directions. 21 tablet 0  . metoclopramide  (REGLAN ) 10 MG tablet Take 1 tablet by mouth 4 hours before starting colon prep and then you may take an additional tablet by mouth 6 hours after until finished 5 tablet 0  .  multivitamin (MULTIVITAMIN) tablet Take 1 tablet by mouth once daily      . nystatin  (MYCOSTATIN ) 100,000 unit/gram cream APPLY TO ABDOMEN TWICE A DAY AS NEEDED    . oxymetazoline  (NO DRIP) 0.05 % nasal spray Place 1 spray into both nostrils once daily as needed    . pantoprazole  (PROTONIX ) 40 MG DR tablet Take 40 mg by mouth once daily    .  potassium chloride  (KLOR-CON  10) 10 MEQ ER tablet Take 10 mEq by mouth once daily      . potassium chloride  (KLOR-CON  M20) 20 MEQ ER tablet Take 1 tablet by mouth once daily    . ranolazine  (RANEXA ) 500 MG ER tablet TAKE 1 TABLET BY MOUTH TWICE A DAY    . rizatriptan  (MAXALT ) 10 MG tablet Take 10 mg by mouth once as needed    . rOPINIRole  (REQUIP ) 1 MG immediate release tablet Take 1 mg by mouth at bedtime    . rOPINIRole  (REQUIP ) 3 MG immediate release tablet Take 3 mg by mouth at bedtime    . rosuvastatin  (CRESTOR ) 20 MG tablet rosuvastatin  20 mg tablet  TK 1 T PO D    . rosuvastatin  (CRESTOR ) 40 MG tablet Take 1 tablet by mouth once daily    . sertraline  (ZOLOFT ) 100 MG tablet TAKE 1 TABLET BY MOUTH EVERY DAY    . sulfamethoxazole -trimethoprim  (BACTRIM  DS) 800-160 mg tablet Take 1 tablet by mouth 2 (two) times daily    . traMADoL  (ULTRAM ) 50 mg tablet 1/2-1 po qhs prn 30 tablet 1  . VITAMIN D3 ORAL 1 tablet daily    . ALPRAZolam  (XANAX ) 0.25 MG tablet Take 1 tablet by mouth 2 (two) times daily as needed (Patient not taking: Reported on 04/15/2024)    . apixaban  (ELIQUIS ) 5 mg tablet TAKE 1 TABLET BY MOUTH TWICE A DAY (Patient not taking: Reported on 04/15/2024)    . gabapentin  (NEURONTIN ) 100 MG capsule Take 100 mg by mouth 3 (three) times daily (Patient not taking: Reported on 03/04/2024)    . HYDROcodone -acetaminophen  (NORCO) 5-325 mg tablet TK 1 TO 2 TS PO Q 6 H (Patient not taking: Reported on 03/04/2024)    . oxyCODONE  (ROXICODONE ) 5 MG immediate release tablet Take 1 tablet (5 mg total) by mouth every 4 (four) hours as needed for Pain (severe pain)  (Patient not taking: Reported on 03/04/2024) 21 tablet 0  . oxyCODONE  (ROXICODONE ) 5 MG immediate release tablet Take 1 tablet (5 mg total) by mouth every 6 (six) hours as needed for Pain (Breakthrough pain) (Patient not taking: Reported on 03/04/2024) 30 tablet 0   No current facility-administered medications on file prior to visit.    Allergies as of 04/15/2024 - Reviewed 04/15/2024  Allergen Reaction Noted  . Adhesive Itching 08/25/2018  . Adhesive tape-silicones Itching and Rash 08/12/2015  . Hydrocodone  Itching 08/25/2018  . Latex Itching and Rash 08/12/2015  . Oxycodone  Itching 02/06/2017  . Prednisone  Itching 08/25/2018  . Tapentadol Itching and Rash 08/12/2015  . Tramadol  Itching 04/05/2018    ROS More than 10 system, review of system form was given to the patient to fill out and has been signed by [employer] FNP-C and scanned into the patient's chart.   Vital signs Vitals:   04/15/24 1046  BP: (!) 165/87  Pulse: 62  Temp: 36.7 C (98.1 F)  TempSrc: Oral  Weight: (!) 113.9 kg (251 lb 1.7 oz)  Height: 165.1 cm (5' 5)  PainSc:   2  PainLoc: Back    Exam General: Alert oriented morbidly obese no distress Cardiovascular: Regular rate and rhythm Respiratory: Bilateral breath sounds clear no tachypnea  Lumbosacral Exam (performed 03/04/2024) On inspection no rashes noted.  On palpation she has mild tenderness of the lumbosacral paraspinal musculature.  Lumbar extension with rotation is guarded and produces moderate lumbosacral pain.  Lower Extremity Exam She has 5/5 strength of bilateral dorsiflexors, knee extensors, and hip flexors.  Sensation light touch is intact throughout bilateral lower extremities.  She has symmetrical absent patellar reflexes.  She has symmetrical trace Achilles reflexes.  She does not have ankle clonus.  Straight leg raise is negative bilaterally.  Range of motion of the hip joints does not produce pain.    Radiographic Data MRI from [facility]  dated 10/16/2023 images and report were reviewed today. MRI LUMBAR SPINE WITHOUT CONTRAST  TECHNIQUE:  Multiplanar, multisequence MR imaging of the lumbar spine was  performed. No intravenous contrast was administered.  COMPARISON:  Lumbar MRI 02/23/2018. CT Abdomen and Pelvis  07/12/2021.  FINDINGS:  Segmentation: Normal segmentation on the CT comparison, same  numbering system as on the 2019 MRI.  Alignment: Generally stable lumbar lordosis since 2019. mild chronic  retrolisthesis at L1-L2 and L2-L3 is stable. Similar new  retrolisthesis at L3-L4. Subtle chronic anterolisthesis at L4-L5 is  stable. Mild dextroconvex chronic lumbar scoliosis.  Vertebrae: Normal background bone marrow signal. Stable vertebral  height since 2019. Chronic degenerative endplate changes with a mix  of both acute and chronic degenerative marrow signal changes  including mild marrow edema inferiorly at the L1 endplate, at the  left L4 superior endplate. Occasional benign vertebral hemangiomas  (no follow-up imaging recommended). Intact visible sacrum and SI  joints. Chronic postoperative changes as detailed below.  Conus medullaris and cauda equina: Conus extends to the L1 level. No  lower spinal cord or conus signal abnormality.  Paraspinal and other soft tissues: Chronic benign renal cysts (no  follow-up imaging recommended). Negative visible other abdominal  viscera. Chronic mild postoperative changes to the posterior  paraspinal soft tissues.   Disc levels:  Visible lower thoracic levels through  T12-L1:  Negative.  L1-L2: Progressed disc space loss since 2019. Circumferential disc  osteophyte complex asymmetric to the right. Mild facet and ligament  flavum hypertrophy. Epidural lipomatosis. New mild spinal stenosis.  Mild left and moderate right L1 foraminal stenosis have increased.  L2-L3: Progressed disc space loss with circumferential disc  osteophyte complex asymmetric to the right. Mild facet  and ligament  flavum hypertrophy. Moderate new spinal stenosis (series 8, image  13). Moderate right lateral recess stenosis is new (right L3 nerve  level). Moderate to severe right L2 foraminal stenosis has  increased. Stable mild left foraminal stenosis.  L3-L4: Posterior decompression, laminectomy in the midline since  2019. Severe residual facet and ligament flavum hypertrophy greater  on the left. Circumferential disc osteophyte complex asymmetric to  the left. Mild residual spinal and lateral recess stenosis. Severe  left L3 foraminal stenosis is largely new since 2019 (series 5,  image 14). A small posterior synovial cyst just below the disc space  does not meaningfully contribute to stenosis (series 6, image 10).  Right L3 foraminal stenosis is mild.  L4-L5: Chronic grade 1 anterolisthesis. Chronic midline posterior  decompression. Stable moderate facet hypertrophy greater on the  left. No stenosis.  L5-S1: Disc space loss with similar chronic circumferential disc  osteophyte complex asymmetric to the right. Mild facet and ligament  flavum hypertrophy. No spinal or lateral recess stenosis. Mild to  moderate bilateral L5 foraminal stenosis is stable.   IMPRESSION:  1. Stable chronic lower lumbar spine degeneration at L4-L5 and  L5-S1, the former chronically decompressed posteriorly.  2. Midline posterior L3-L4 posterior decompression since 2019, but  severe residual facet and posterior element hypertrophy greater on  the left. Severe new left foraminal stenosis, query left L3  radiculitis. Mild residual spinal and lateral recess  stenosis.  3. Progressed degeneration and spinal stenosis also at L1-L2 and  L2-L3 since 2019, moderate at the latter. Increased right greater  than left neural foraminal stenosis at those levels.   Electronically Signed    By: VEAR Hurst M.D.    On: 10/16/2023 05:33    Impression 1.  Left groin pain with radiation into the anterior thigh to the  knee.  Clinically she has symptoms consistent with an L3 radiculitis.  2.  Atrial fibrillation, on Eliquis .  3.  Hypertension, dyslipidemia. 4.  Depression. 5.  History of breast cancer.   Plan 1.  Continue Tylenol  650 mg 2 tablets twice daily as needed. 2.  Continue tramadol  50 mg 1/2 to 1 tablet nightly as needed for breakthrough pain, #30, 1 refill. 3.  We provided her with a letter to hold her membership at [employer] stretching. 4.  Continue water  aerobics and home stretching. 5.  She will follow-up with me as needed.  If she has recurrent pain she can call we will schedule her for a left L2-3 and left L3-4 transforaminal ESI (after 06/18/2024).    BENJAMIN CHARLES CHASNIS, DO  This note was generated in part with voice recognition software and I apologize for any typographical errors that were not detected and corrected.  PCP: [name], NP

## 2024-04-18 ENCOUNTER — Ambulatory Visit: Attending: Infectious Diseases | Admitting: Infectious Diseases

## 2024-04-18 ENCOUNTER — Encounter: Payer: Self-pay | Admitting: Infectious Diseases

## 2024-04-18 VITALS — BP 170/89 | HR 55 | Temp 96.4°F | Ht 65.0 in | Wt 249.0 lb

## 2024-04-18 DIAGNOSIS — Z96643 Presence of artificial hip joint, bilateral: Secondary | ICD-10-CM | POA: Diagnosis not present

## 2024-04-18 DIAGNOSIS — Z91148 Patient's other noncompliance with medication regimen for other reason: Secondary | ICD-10-CM | POA: Diagnosis not present

## 2024-04-18 DIAGNOSIS — Z96653 Presence of artificial knee joint, bilateral: Secondary | ICD-10-CM | POA: Insufficient documentation

## 2024-04-18 DIAGNOSIS — Z7901 Long term (current) use of anticoagulants: Secondary | ICD-10-CM | POA: Diagnosis not present

## 2024-04-18 DIAGNOSIS — G2581 Restless legs syndrome: Secondary | ICD-10-CM

## 2024-04-18 DIAGNOSIS — Z79899 Other long term (current) drug therapy: Secondary | ICD-10-CM | POA: Insufficient documentation

## 2024-04-18 DIAGNOSIS — Z96652 Presence of left artificial knee joint: Secondary | ICD-10-CM

## 2024-04-18 DIAGNOSIS — T8452XD Infection and inflammatory reaction due to internal left hip prosthesis, subsequent encounter: Secondary | ICD-10-CM

## 2024-04-18 DIAGNOSIS — Y831 Surgical operation with implant of artificial internal device as the cause of abnormal reaction of the patient, or of later complication, without mention of misadventure at the time of the procedure: Secondary | ICD-10-CM | POA: Insufficient documentation

## 2024-04-18 DIAGNOSIS — I4891 Unspecified atrial fibrillation: Secondary | ICD-10-CM

## 2024-04-18 DIAGNOSIS — I130 Hypertensive heart and chronic kidney disease with heart failure and stage 1 through stage 4 chronic kidney disease, or unspecified chronic kidney disease: Secondary | ICD-10-CM | POA: Insufficient documentation

## 2024-04-18 DIAGNOSIS — M549 Dorsalgia, unspecified: Secondary | ICD-10-CM | POA: Insufficient documentation

## 2024-04-18 DIAGNOSIS — I5022 Chronic systolic (congestive) heart failure: Secondary | ICD-10-CM | POA: Diagnosis not present

## 2024-04-18 DIAGNOSIS — N182 Chronic kidney disease, stage 2 (mild): Secondary | ICD-10-CM | POA: Diagnosis not present

## 2024-04-18 DIAGNOSIS — T8452XA Infection and inflammatory reaction due to internal left hip prosthesis, initial encounter: Secondary | ICD-10-CM | POA: Insufficient documentation

## 2024-04-18 DIAGNOSIS — Z981 Arthrodesis status: Secondary | ICD-10-CM | POA: Insufficient documentation

## 2024-04-18 DIAGNOSIS — T8450XD Infection and inflammatory reaction due to unspecified internal joint prosthesis, subsequent encounter: Secondary | ICD-10-CM

## 2024-04-18 DIAGNOSIS — Z96642 Presence of left artificial hip joint: Secondary | ICD-10-CM

## 2024-04-18 DIAGNOSIS — B957 Other staphylococcus as the cause of diseases classified elsewhere: Secondary | ICD-10-CM | POA: Diagnosis not present

## 2024-04-18 DIAGNOSIS — I10 Essential (primary) hypertension: Secondary | ICD-10-CM

## 2024-04-18 NOTE — Progress Notes (Unsigned)
 NAME: Amber Barnes  DOB: 12/29/1950  MRN: [id number]  Date/Time: 04/18/2024 10:11 AM   Subjective:    ?Follow up visit  for left hip Pji due to staph lugdunensis Doing well Completed po batcrim  in aug 2025  Amber Barnes is a 73 y.o. with a history of rt Ca breast, b/l TKA. Left THA, afib , cardiac ablation and  a recent left hip prosthetic joint infection  presents for follow-up .    On April 8th,2025 she had DAIR - wash out, the liner and head were changed, and antibiotic beads were placed. Staph lugdunesis in ulture- completed 6 weeks of IV daptoimycin and then went  on Bactrim  which she completed  after she saw Dr.Aberman 02/28/24 Last Esr 33 and CRP 0.7 No pain hip- 'she had to get ESI for back pain  03/19/2024: Left L2-3 and left L3-4 transforaminal ESI (over 90% relief)  She has not been taking any pain meds   Her past medical history includes bilateral hip replacements, with the most recent hip surgery in October, and a knee replacement on March 3rd.2025  She has a history of atrial fibrillation for which she takes Eliquis . She also takes vitamin D3, Requip  for restless leg syndrome, Xanax  as needed, Buspar , Lexapro , bisoprolol , and Hyzaar as needed for blood pressure spikes.   Past Medical History:  Diagnosis Date   Achilles tendinitis    Acute medial meniscal tear    Allergy    Anemia    Anxiety    a.) on BZO PRN (alprazolam )   Aortic atherosclerosis    Ascending aorta dilatation 10/20/2022   a.) TTE 10/20/2022: asc Ao 39 mm   Atrial fibrillation/flutter (HCC) 07/2016   a.) Dx'd 07/2016; b.) CHA2DS2-VASc = 5 (age, sex, HFmrEF, HTN, vasc disease history) as of 05/09/2023; c.) s/p DCCV 09/07/2016 (150 J x 2 --> A.flutter; 200 J x 1 --> NSR); d.) s/p DCCV 10/19/2016 (150 J x 1 --> NSR); e.) brought in for DCCV 12/2016; cancelled d/t NSR; f.) s/p PVI ablation 12/12/2022; g.) cardiac rate/rhythm maintained on oral bisoprolol ; chronically anticoagulated using apixaban     BMI 50.0-59.9, adult (HCC) 07/16/2018   CAD (coronary artery disease)    a.) cCTA 12/08/2022: Ca2+ = 31.6 (58th %'ile for age/sex/race match control)   Cardiomegaly    Chemotherapy induced nausea and vomiting 05/18/2021   Chronic heart failure with mid-range ejection fraction (HFmrEF) (HCC)    a.) TTE 03/08/21: EF 60-65, mild LVH, G1DD, mild LAE, triv MR; b.) TTE 07/29/21: EF 60-65, G1DD, mild LAE, triv MR; c.) TTE 11/26/21: EF 55-60, G1DD, mild LAE, mild MR; d.) TTE 02/25/22: EF 60-65, G1DD, mild LAE, mild MR; e.) TTE 05/31/22: EF 55-60, mild LVH, G2DD, mild-mod MR; f.) TTE 10/20/22: EF 45-50, mild LAE, mod red RVSF, mild-mod TR, Asc Ao 39; g.) TTE 12/13/22: EF 50-55, mild LVH, mild MAC   CKD (chronic kidney disease), stage II    Colon polyp    Complication of anesthesia    a.) emergence delirium; b.) hypoxia after hip replacement   COPD (chronic obstructive pulmonary disease) (HCC)    DDD (degenerative disc disease), lumbar    a.) s/p fusion   Depression    Endometriosis    Family history of CLL (chronic lymphoid leukemia) 03/21/2020   Fibrocystic breast disease    GERD (gastroesophageal reflux disease)    History of kidney stones    History of stress test    a.) MV 07/29/2016: EF 55-65%. Small, mild defect  in  mid anteroseptal, apical septal, and apical locations. No ischemia-->low risk; b.) MV 10/2022: mod sized partially rev apical ant, apical septal, and apical defect. EF 52%. No signific cor Ca2+; c.) cCTA 12/08/2022: Ca2+ = 31.6 (58th %'ile).   History of tobacco use    Hyperlipidemia    Hypertension    Hypothyroidism    a.) s/p partial thyroidectomy for nodule; pathology benign   Insomnia    Left knee pain, unspecified chronicity    Lipoma    Malignant neoplasm of upper-outer quadrant of right female breast (HCC) 03/03/2021   a.) stage 1A invasive mammary carcinoma (cT1b or T1c N0); ER (-), PR (+), HER2/neu (+); did not tolerate adjuvant chemotherapy (paclitacel + trastuzumab ) despite  paclitaxel  dose reduction; s/p adjuvant XRT; cinished maintenance trastuzumab  07/27/2022; no adjuvant endocrine therapy as no clear benefit   Migraine    Nephrolithiasis    On apixaban  therapy    OSA (obstructive sleep apnea)    a.) non-compliant with nocturnal PAP therapy   Osteoarthritis    Osteopenia    PAF (paroxysmal atrial fibrillation) (HCC) 12/13/2022   Peptic ulcer disease    a.) H. pylori   Pericarditis 12/12/2022   a.) following A.fib ablation; resolved   Pneumonia    Port-A-Cath in place    Primary localized osteoarthritis of pelvic region and thigh 03/02/2022   RLS (restless legs syndrome)    a.) on ropinirole    S/P TKR (total knee replacement), left 09/11/2023   S/P total left hip arthroplasty 05/11/2023   Vertigo 06/08/2016   Vitamin D  deficiency     Past Surgical History:  Procedure Laterality Date   ABDOMINAL HYSTERECTOMY     ovaries left   ACHILLES TENDON SURGERY     2012,01/2017   ATRIAL FIBRILLATION ABLATION N/A 12/12/2022   Procedure: ATRIAL FIBRILLATION ABLATION;  Surgeon: Cindie Ole DASEN, MD;  Location: [facility];  Service: Cardiovascular;  Laterality: N/A;   BREAST BIOPSY Right 01/15/2021   Affirm biopsy-X clip-INVASIVE MAMMARY CARCINOMA   BREAST BIOPSY Right 01/15/2021   u/s bx-venus clip INVASIVE MAMMARY CARCINOMA   BREAST BIOPSY Right 01/11/2024   us  bx x 2. 10:00 13cfm venus marker, 1:00 9 cmfn ribbon marker, paths pending   BREAST BIOPSY Right 01/04/2023   u/s bx ribbon clip path pending   BREAST BIOPSY Right 01/04/2023   US  RT BREAST BX W LOC DEV 1ST LESION IMG BX SPEC US  GUIDE 01/04/2023 [facility]   BREAST BIOPSY Right 01/11/2024   US  RT BREAST BX W LOC DEV EA ADD LESION IMG BX SPEC US  GUIDE 01/11/2024 [facility]   BREAST BIOPSY Right 01/11/2024   US  RT BREAST BX W LOC DEV 1ST LESION IMG BX SPEC US  GUIDE 01/11/2024 [facility]   BREAST LUMPECTOMY,RADIO FREQ LOCALIZER,AXILLARY SENTINEL LYMPH NODE BIOPSY Right 02/17/2021    Procedure: BREAST LUMPECTOMY,RADIO FREQ LOCALIZER,AXILLARY SENTINEL LYMPH NODE BIOPSY;  Surgeon: Lane Shope, MD;  Location: [facility];  Service: General;  Laterality: Right;   CARDIOVERSION N/A 09/07/2016   Procedure: CARDIOVERSION;  Surgeon: Evalene JINNY Lunger, MD;  Location: [facility];  Service: Cardiovascular;  Laterality: N/A;   CARDIOVERSION N/A 10/19/2016   Procedure: CARDIOVERSION;  Surgeon: Evalene JINNY Lunger, MD;  Location: [facility];  Service: Cardiovascular;  Laterality: N/A;   CARDIOVERSION N/A 12/14/2016   Procedure: CARDIOVERSION;  Surgeon: Lunger Evalene JINNY, MD;  Location: [facility];  Service: Cardiovascular;  Laterality: N/A;   COLONOSCOPY     COLONOSCOPY WITH PROPOFOL  N/A 05/13/2020   Procedure: COLONOSCOPY WITH PROPOFOL ;  Surgeon: Toledo, Ladell POUR, MD;  Location: [facility];  Service: Gastroenterology;  Laterality: N/A;  ELIQUIS    CYSTOSCOPY WITH STENT PLACEMENT Bilateral 07/12/2021   Procedure: CYSTOSCOPY WITH STENT PLACEMENT;  Surgeon: Francisca Redell BROCKS, MD;  Location: [facility];  Service: Urology;  Laterality: Bilateral;   CYSTOSCOPY/URETEROSCOPY/HOLMIUM LASER/STENT PLACEMENT Bilateral 07/30/2021   Procedure: CYSTOSCOPY/URETEROSCOPY/HOLMIUM LASER/BILATERAL STENT REMOVAL;  Surgeon: Francisca Redell BROCKS, MD;  Location: [facility];  Service: Urology;  Laterality: Bilateral;   ESOPHAGOGASTRODUODENOSCOPY     ESOPHAGOGASTRODUODENOSCOPY (EGD) WITH PROPOFOL  N/A 05/13/2020   Procedure: ESOPHAGOGASTRODUODENOSCOPY (EGD) WITH PROPOFOL ;  Surgeon: Toledo, Ladell POUR, MD;  Location: [facility];  Service: Gastroenterology;  Laterality: N/A;   EXCISIONAL TOTAL HIP ARTHROPLASTY WITH ANTIBIOTIC SPACERS Left 10/17/2023   Procedure: EXCISIONAL TOTAL HIP ARTHROPLASTY WITH PLACEMENT OF ANTIBIOTIC BEADS AND DRAIN;  Surgeon: Lorelle Hussar, MD;  Location: [facility];  Service: Orthopedics;  Laterality: Left;   FOOT SURGERY Left    tendon   LUMBAR FUSION     PORTA CATH INSERTION N/A 03/23/2021    Procedure: PORTA CATH INSERTION;  Surgeon: Jama Cordella MATSU, MD;  Location: [facility];  Service: Cardiovascular;  Laterality: N/A;   PORTA CATH REMOVAL N/A 02/27/2024   Procedure: PORTA CATH REMOVAL;  Surgeon: Jama Cordella MATSU, MD;  Location: [facility];  Service: Cardiovascular;  Laterality: N/A;   PORTACATH PLACEMENT N/A 03/23/2021   Procedure: ATTEMPTED INSERTION PORT-A-CATH;  Surgeon: Lane Shope, MD;  Location: [facility];  Service: General;  Laterality: N/A;   THYROIDECTOMY  1990   half thyroid  lobectomy secondary to a benign nodule   TOTAL HIP ARTHROPLASTY Right 08/21/2018   Procedure: TOTAL HIP ARTHROPLASTY ANTERIOR APPROACH-RIGHT CELL SAVER REQUESTED;  Surgeon: Kathlynn Sharper, MD;  Location: [facility];  Service: Orthopedics;  Laterality: Right;   TOTAL HIP ARTHROPLASTY Left 05/11/2023   Procedure: TOTAL HIP ARTHROPLASTY ANTERIOR APPROACH;  Surgeon: Lorelle Hussar, MD;  Location: [facility];  Service: Orthopedics;  Laterality: Left;   TOTAL KNEE ARTHROPLASTY Left 09/11/2023   Procedure: ARTHROPLASTY, KNEE, TOTAL;  Surgeon: Lorelle Hussar, MD;  Location: [facility];  Service: Orthopedics;  Laterality: Left;    Social History   Socioeconomic History   Marital status: Married    Spouse name: jeffrey   Number of children: 1   Years of education: Not on file   Highest education level: 12th grade  Occupational History    Comment: disabled  Tobacco Use   Smoking status: Former    Current packs/day: 0.00    Average packs/day: 0.3 packs/day for 10.0 years (2.5 ttl pk-yrs)    Types: Cigarettes    Start date: 13    Quit date: 2005    Years since quitting: 20.7    Passive exposure: Past   Smokeless tobacco: Never   Tobacco comments:    smoked for 10 years, 1 pack every 2-3 days  Vaping Use   Vaping status: Never Used  Substance and Sexual Activity   Alcohol use: Yes    Alcohol/week: 1.0 standard drink of alcohol    Types: 1 Glasses of wine per week     Comment: margarita qd   Drug use: Not Currently   Sexual activity: Not Currently  Other Topics Concern   Not on file  Social History Narrative   Moved to [place] from [place], originally from [place].    1 cup of caffeine  daily   Right handed   Lives with husband, step-daughter and child  Social Drivers of Corporate investment banker Strain: Low Risk  (03/04/2024)   Received [facility]   Overall Financial Resource Strain (CARDIA)    Difficulty of Paying Living Expenses: Not very hard  Food Insecurity: No Food Insecurity (03/04/2024)   Received from [facility]   Hunger Vital Sign    Within the past 12 months, you worried that your food would run out before you got the money to buy more.: Never true    Within the past 12 months, the food you bought just didn't last and you didn't have money to get more.: Never true  Transportation Needs: No Transportation Needs (03/04/2024)   Received from [facility] - Transportation    In the past 12 months, has lack of transportation kept you from medical appointments or from getting medications?: No    Lack of Transportation (Non-Medical): No  Physical Activity: Insufficiently Active (01/01/2024)   Exercise Vital Sign    Days of Exercise per Week: 3 days    Minutes of Exercise per Session: 30 min  Stress: Stress Concern Present (01/01/2024)   [employer] of Occupational Health - Occupational Stress Questionnaire    Feeling of Stress: Rather much  Social Connections: Moderately Integrated (01/01/2024)   Social Connection and Isolation Panel    Frequency of Communication with Friends and Family: More than three times a week    Frequency of Social Gatherings with Friends and Family: More than three times a week    Attends Religious Services: More than 4 times per year    Active Member of [employer] or Organizations: No    Attends Banker  Meetings: Not on file    Marital Status: Married  Catering manager Violence: Not At Risk (12/25/2023)   Humiliation, Afraid, Rape, and Kick questionnaire    Fear of Current or Ex-Partner: No    Emotionally Abused: No    Physically Abused: No    Sexually Abused: No    Family History  Problem Relation Age of Onset   Cancer Mother        leukemia   Cancer Father 57       colon   Heart disease Father    Heart attack Father 62       died from MI   Hyperlipidemia Father    Hypertension Father    Cancer Sister 38       ovarian   Depression Sister    Cancer Brother        CLL   Breast cancer Paternal Aunt        40's   Migraines Neg Hx    Thyroid  cancer Neg Hx    Allergies  Allergen Reactions   Hydrocodone     Amiodarone  Nausea And Vomiting and Other (See Comments)   Hydrocodone -Acetaminophen  Other (See Comments)    Hallucinations and combative   Tapentadol Itching   Adhesive [Tape] Itching and Rash    Prefers paper tape   Latex Itching and Rash    use paper tap   Oxycodone  Itching    Can take it with benadryl    Wound Dressing Adhesive    I? Current Outpatient Medications  Medication Sig Dispense Refill   acetaminophen  (TYLENOL ) 500 MG tablet Take 2 tablets (1,000 mg total) by mouth every 8 (eight) hours. 30 tablet 0   ALPRAZolam  (XANAX ) 0.25 MG tablet Take 1 tablet (0.25 mg total) by mouth daily as needed for anxiety. 30  tablet 1   bisoprolol  (ZEBETA ) 10 MG tablet TAKE 1.5 TABLETS BY MOUTH 2 TIMES DAILY. 270 tablet 3   busPIRone  (BUSPAR ) 5 MG tablet Take 1 tablet (5 mg total) by mouth 3 (three) times daily as needed. 270 tablet 3   Calcium  Carbonate (CALCIUM  500 PO) Take 1 tablet by mouth daily.     Cholecalciferol  (VITAMIN D3) 5000 units CAPS Take 5,000 Units by mouth daily.     docusate sodium  (COLACE) 100 MG capsule Take 1 capsule (100 mg total) by mouth 2 (two) times daily.     ELIQUIS  5 MG TABS tablet TAKE 1 TABLET BY MOUTH TWICE A DAY 180 tablet 1   escitalopram   (LEXAPRO ) 20 MG tablet Take 1 tablet (20 mg total) by mouth daily. 90 tablet 3   famotidine  (PEPCID ) 20 MG tablet Take 1 tablet (20 mg total) by mouth 2 (two) times daily. 60 tablet 0   furosemide  (LASIX ) 20 MG tablet TAKE 1 TABLET (20 MG TOTAL) BY MOUTH DAILY. MAY TAKE ADDITIONAL 20MG  AS NEEDED FOR LEG SWELLING 180 tablet 3   KLOR-CON  M20 20 MEQ tablet TAKE 1 TABLET BY MOUTH EVERY DAY 90 tablet 3   losartan -hydrochlorothiazide  (HYZAAR) 50-12.5 MG tablet Take 0.5 tablets by mouth daily as needed (for BP 140/80). 30 tablet 1   metoCLOPramide  (REGLAN ) 10 MG tablet Take 1 tablet (10 mg total) by mouth every 6 (six) hours as needed. 30 tablet 0   Multiple Vitamin (MULTIVITAMIN) tablet Take 1 tablet by mouth daily.     ondansetron  (ZOFRAN ) 4 MG tablet Take 1 tablet (4 mg total) by mouth every 6 (six) hours as needed for nausea. 20 tablet 0   ranolazine  (RANEXA ) 500 MG 12 hr tablet TAKE 1 TABLET BY MOUTH TWICE A DAY 180 tablet 3   rOPINIRole  (REQUIP ) 2 MG tablet TAKE 1 TABLET BY MOUTH EVERYDAY AT BEDTIME 90 tablet 3   rosuvastatin  (CRESTOR ) 40 MG tablet TAKE 1 TABLET BY MOUTH EVERY DAY 90 tablet 3   traMADol  (ULTRAM ) 50 MG tablet 1/2-1 po qhs prn     No current facility-administered medications for this visit.     Abtx:  Anti-infectives (From admission, onward)    None       REVIEW OF SYSTEMS:  Const: negative fever, negative chills, negative weight loss Eyes: negative diplopia or visual changes, negative eye pain ENT: negative coryza, negative sore throat Resp: negative cough, hemoptysis, dyspnea Cards: negative for chest pain, palpitations, lower extremity edema GU: negative for frequency, dysuria and hematuria GI: Negative for abdominal pain, diarrhea, bleeding, constipation Skin: negative for rash and pruritus Heme: negative for easy bruising and gum/nose bleeding MS: left inner thigh pain Neurolo:negative for headaches, dizziness, vertigo, memory problems  Psych: negative for  feelings of anxiety, depression  Endocrine: negative for thyroid , diabetes Allergy/Immunology- multiple allergies as listed above Objective:  VITALS:  Pulse (!) 55   Temp (!) 96.4 F (35.8 C) (Temporal)   Ht 5' 5 (1.651 m)   Wt 249 lb (112.9 kg)   SpO2 93%   BMI 41.44 kg/m  LDA PORT PHYSICAL EXAM:  General: Alert, cooperative, no distress, appears stated age. In wheel chair Head: Normocephalic, without obvious abnormality, atraumatic. Eyes: Conjunctivae clear, anicteric sclerae. Pupils are equal ENT Nares normal. No drainage or sinus tenderness. Lips, mucosa, and tongue normal. No Thrush Neck: Supple, symmetrical, no adenopathy, thyroid : non tender no carotid bruit and no JVD. Back: No CVA tenderness. Lungs: Clear to auscultation bilaterally. No Wheezing or Rhonchi. No  rales. Heart: Regular rate and rhythm, no murmur, rub or gallop. Abdomen: Soft, non-tender,not distended. Bowel sounds normal. No masses Extremities: left thigh surgical scar- healed completely  LEFT TKA site healthy Skin: No rashes or lesions. Or bruising Lymph: Cervical, supraclavicular normal. Neurologic: Grossly non-focal Pertinent Labs Lab Results       Latest Ref Rng & Units 03/19/2024   10:50 AM 03/04/2024    8:28 AM 02/15/2024   10:40 AM  CMP  Glucose 70 - 99 mg/dL  85  76   BUN 6 - 23 mg/dL  18  24   Creatinine 9.59 - 1.20 mg/dL  9.19  8.80   Sodium 864 - 145 mEq/L  142  140   Potassium 3.5 - 5.1 mEq/L  4.1  4.7   Chloride 96 - 112 mEq/L  103  103   CO2 19 - 32 mEq/L  29  27   Calcium  8.6 - 10.4 mg/dL 8.9  8.3  9.2   Total Protein 6.5 - 8.1 g/dL   7.1   Total Bilirubin 0.0 - 1.2 mg/dL   0.7   Alkaline Phos 38 - 126 U/L   62   AST 15 - 41 U/L   19   ALT 0 - 44 U/L   13    Labs from 4/29 - CK 34, creatinine 0.84, hemoglobin 10, WBC 3.3 , eosinophils 4%   ? 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 She underwent surgical debridement and antibiotic bead placement on October 17, 2023. Completed 6 weeks of IV dapt and then bactrim  until 8/20- completed 4 months  ESR, CRP were normal and Dr.Aberman saw her on 8/20 and we discussed and dc the antibiotic No complaints since then- the left hip has healed completely  Back pain 03/19/2024: Left L2-3 and left L3-4 transforaminal ESI (over 90% relief)   Bilateral hip replacement    Knee replacement   Left Knee replacement with no current issues . Atrial fibrillation   On eliquis   Hypertension   She takes PRN losartan /hydrochlorothiazide  as needed for elevated blood pressure episodes.   Afib s/p ablation- on eliquis   H/o rt breast ca s/p surgery, chemo and radiation and immune therapy   Restless legs syndrome is managed with Requip . .   Discussed the management in detail  Discharged from my clinic Follow PRN

## 2024-04-23 DIAGNOSIS — Z96642 Presence of left artificial hip joint: Secondary | ICD-10-CM | POA: Diagnosis not present

## 2024-04-26 ENCOUNTER — Other Ambulatory Visit: Payer: Self-pay | Admitting: Family

## 2024-04-26 DIAGNOSIS — G2581 Restless legs syndrome: Secondary | ICD-10-CM

## 2024-04-29 ENCOUNTER — Ambulatory Visit: Payer: Self-pay

## 2024-04-29 NOTE — Telephone Encounter (Signed)
 FYI Only or Action Required?: FYI only for provider.  Patient was last seen in primary care on 04/02/2024 by Dineen Rollene MATSU, FNP.  Called Nurse Triage reporting Urinary Frequency.  Symptoms began 4 to 5 days ago.  Interventions attempted: Nothing.  Symptoms are: gradually worsening.  Triage Disposition: See Physician Within 24 Hours  Patient/caregiver understands and will follow disposition?: Yes     Copied from CRM #[id number]. Topic: Clinical - Red Word Triage >> Apr 29, 2024  1:58 PM Mesmerise C wrote: [facility] that prompted transfer to Nurse Triage: Patient states she can't control her bladder, has been frequently urinating been ongoing 4-5 days now Reason for Disposition  Urinating more frequently than usual (i.e., frequency) OR new-onset of the feeling of an urgent need to urinate (i.e., urgency)    Offered pt to be seen w/n 24 hours at [facility]: pt refused stated she wanted to go to her clinic and could be seen by anyone at that clinic:pt stated she would take Wednesday appt: nurse scheduled appt.  Answer Assessment - Initial Assessment Questions 1. SYMPTOM: What's the main symptom you're concerned about? (e.g., frequency, incontinence)     Increased frequency, incontinence 2. ONSET: When did the    start?     5 days 3. PAIN: Is there any pain? If Yes, ask: How bad is it? (Scale: 1-10; mild, moderate, severe)     no 4. CAUSE: What do you think is causing the symptoms?      unknown 5. OTHER SYMPTOMS: Do you have any other symptoms? (e.g., blood in urine, fever, flank pain, pain with urination)     no 6. PREGNANCY: Is there any chance you are pregnant? When was your last menstrual period?     na  Protocols used: Urinary Symptoms-A-AH

## 2024-05-01 ENCOUNTER — Ambulatory Visit (INDEPENDENT_AMBULATORY_CARE_PROVIDER_SITE_OTHER): Admitting: Internal Medicine

## 2024-05-01 VITALS — BP 124/70 | HR 60 | Temp 97.5°F | Ht 65.0 in | Wt 258.8 lb

## 2024-05-01 DIAGNOSIS — N390 Urinary tract infection, site not specified: Secondary | ICD-10-CM | POA: Diagnosis not present

## 2024-05-01 DIAGNOSIS — I10 Essential (primary) hypertension: Secondary | ICD-10-CM | POA: Diagnosis not present

## 2024-05-01 DIAGNOSIS — I5032 Chronic diastolic (congestive) heart failure: Secondary | ICD-10-CM

## 2024-05-01 DIAGNOSIS — R350 Frequency of micturition: Secondary | ICD-10-CM | POA: Diagnosis not present

## 2024-05-01 LAB — POC URINALSYSI DIPSTICK (AUTOMATED)
Bilirubin, UA: NEGATIVE
Glucose, UA: NEGATIVE
Ketones, UA: NEGATIVE
Nitrite, UA: NEGATIVE
Protein, UA: NEGATIVE
Spec Grav, UA: 1.02 (ref 1.010–1.025)
Urobilinogen, UA: 0.2 U/dL
pH, UA: 5.5 (ref 5.0–8.0)

## 2024-05-01 MED ORDER — SULFAMETHOXAZOLE-TRIMETHOPRIM 800-160 MG PO TABS: 1.0000 | ORAL_TABLET | Freq: Two times a day (BID) | ORAL | 0 refills | Status: DC

## 2024-05-01 NOTE — Assessment & Plan Note (Signed)
-   Patient complains of increased urinary urgency and frequency over the last week -Denies any hematuria dysuria or flank pain.  No nausea or vomiting or fevers or chills -On exam, patient has no abdominal or suprapubic tenderness and no CVA tenderness -UA shows trace leukocytes and trace blood -Will treat for likely urinary tract infection with Bactrim  to complete a 5-day course -Will follow-up UA and urine culture -No further workup at this time

## 2024-05-01 NOTE — Assessment & Plan Note (Signed)
-   This problem is chronic and stable -Patient follows up with cardiology for this -Continue with Lasix  20 mg daily, bisoprolol  10 mg daily -No lower extremity edema noted on exam today and lungs are clear to auscultation -No further workup at this time -Patient follow-up with cardiology as scheduled

## 2024-05-01 NOTE — Assessment & Plan Note (Signed)
-   This problem is chronic and stable -We will continue bisoprolol  10 mg daily and Lasix  20 mg daily -Patient states that she takes losartan /HCTZ 50/12.5 mg half tablet as needed when her blood pressure gets elevated -She is going to use this medication once in the last month -Blood pressure today is at goal (124/70) -Continue with current medication regimen for now -No further workup at this time

## 2024-05-01 NOTE — Patient Instructions (Signed)
  VISIT SUMMARY: During today's visit, we discussed your increased urinary frequency and urgency, chronic musculoskeletal pain, and cardiovascular health. We reviewed your symptoms, current medications, and lifestyle habits to manage your conditions effectively.  YOUR PLAN: -URINARY TRACT INFECTION: A urinary tract infection (UTI) is an infection in any part of your urinary system. You have been experiencing increased urinary frequency and urgency with dribbling, which suggests a UTI. We have started you on an empirical antibiotic treatment while we await the culture results to confirm the bacterial infection and determine the best antibiotic for you. Please take the antibiotics as prescribed and complete the full course. We will review the culture results and adjust your treatment if necessary.  -CONGESTIVE HEART FAILURE: Congestive heart failure is a condition where the heart doesn't pump blood as well as it should. Your condition is currently stable with no signs of fluid overload or swelling. Continue to monitor your fluid intake and check for swelling in your feet to prevent exacerbation.  -ESSENTIAL HYPERTENSION: Essential hypertension is high blood pressure with no identifiable cause. Your blood pressure is managed with daily bisoprolol  and as-needed losartan . Continue to monitor your blood pressure daily and take losartan  if you experience a spike. If you notice symptoms like headaches or dizziness, please let us  know.  INSTRUCTIONS: Please follow up with us  once the urine culture results are available so we can adjust your antibiotic treatment if needed. Continue monitoring your blood pressure and fluid intake, and report any new or worsening symptoms immediately.                      Contains text generated by Abridge.                                 Contains text generated by Abridge.

## 2024-05-01 NOTE — Progress Notes (Signed)
 Acute Office Visit  Subjective:     Patient ID: Amber Barnes, female    DOB: [birth year], 73 y.o.   MRN: [id number]  Chief Complaint  Patient presents with   Acute Visit    Urine frequency x 1 week   Discussed the use of AI scribe software for clinical note transcription with the patient, who gave verbal consent to proceed.  History of Present Illness Amber Barnes is a 73 year old female who presents with increased urinary frequency and urgency.  Lower urinary tract symptoms - Increased urinary frequency and urgency, worsening over the past week - No dysuria or hematuria.  No fevers or chills, no flank pain, no nausea or vomiting  Musculoskeletal pain - Chronic back and hip pain, stable without recent change - Pain managed with injections - History of hip infection, previously treated by infectious disease specialist  Cardiovascular symptoms and fluid management - History of congestive heart failure - Monitors fluid intake to prevent peripheral edema - Checks for swelling by observing her feet - No recent swelling reported  Gastrointestinal and constitutional symptoms - No nausea, vomiting, or diarrhea - No fevers or chills  Blood pressure monitoring and management - Monitors blood pressure daily - Uses losartan /HCTZ as needed for blood pressure spikes - Took this once in the past month due to a spike associated with a stressful event       Review of Systems  Constitutional: Negative.   HENT: Negative.    Respiratory: Negative.    Cardiovascular: Negative.   Gastrointestinal: Negative.   Genitourinary:  Positive for frequency and urgency. Negative for dysuria, flank pain and hematuria.  Musculoskeletal: Negative.   Neurological: Negative.   Psychiatric/Behavioral: Negative.          Objective:    BP 124/70   Pulse 60   Temp (!) 97.5 F (36.4 C)   Ht 5' 5 (1.651 m)   Wt 258 lb 12.8 oz (117.4 kg)   SpO2 95%   BMI 43.07 kg/m     Physical Exam Constitutional:      Appearance: Normal appearance.  HENT:     Head: Normocephalic and atraumatic.  Cardiovascular:     Rate and Rhythm: Normal rate and regular rhythm.     Heart sounds: Normal heart sounds.  Pulmonary:     Effort: Pulmonary effort is normal.     Breath sounds: Normal breath sounds. No wheezing, rhonchi or rales.  Abdominal:     General: Bowel sounds are normal. There is no distension.     Palpations: Abdomen is soft.     Tenderness: There is no abdominal tenderness. There is no guarding or rebound.  Musculoskeletal:        General: No swelling or tenderness.     Right lower leg: No edema.     Left lower leg: No edema.  Neurological:     Mental Status: She is alert.  Psychiatric:        Mood and Affect: Mood normal.        Behavior: Behavior normal.     Results for orders placed or performed in visit on 05/01/24  POCT Urinalysis Dipstick (Automated)  Result Value Ref Range   Color, UA dark yellow    Clarity, UA clear    Glucose, UA Negative Negative   Bilirubin, UA neg    Ketones, UA neg    Spec Grav, UA 1.020 1.010 - 1.025   Blood, UA trace intact    pH, UA  5.5 5.0 - 8.0   Protein, UA Negative Negative   Urobilinogen, UA 0.2 0.2 or 1.0 E.U./dL   Nitrite, UA neg    Leukocytes, UA Trace (A) Negative        Assessment & Plan:   Problem List Items Addressed This Visit       Cardiovascular and Mediastinum   Chronic heart failure with preserved ejection fraction (HFpEF) (HCC)   - This problem is chronic and stable -Patient follows up with cardiology for this -Continue with Lasix  20 mg daily, bisoprolol  10 mg daily -No lower extremity edema noted on exam today and lungs are clear to auscultation -No further workup at this time -Patient follow-up with cardiology as scheduled      Hypertension - Primary   - This problem is chronic and stable -We will continue bisoprolol  10 mg daily and Lasix  20 mg daily -Patient states that  she takes losartan /HCTZ 50/12.5 mg half tablet as needed when her blood pressure gets elevated -She is going to use this medication once in the last month -Blood pressure today is at goal (124/70) -Continue with current medication regimen for now -No further workup at this time        Genitourinary   UTI (urinary tract infection)   - Patient complains of increased urinary urgency and frequency over the last week -Denies any hematuria dysuria or flank pain.  No nausea or vomiting or fevers or chills -On exam, patient has no abdominal or suprapubic tenderness and no CVA tenderness -UA shows trace leukocytes and trace blood -Will treat for likely urinary tract infection with Bactrim  to complete a 5-day course -Will follow-up UA and urine culture -No further workup at this time      Relevant Medications   sulfamethoxazole -trimethoprim  (BACTRIM  DS) 800-160 MG tablet   Other Visit Diagnoses       Urine frequency       Relevant Orders   POCT Urinalysis Dipstick (Automated) (Completed)   Urine Culture   Urinalysis, Routine w reflex microscopic       Meds ordered this encounter  Medications   sulfamethoxazole -trimethoprim  (BACTRIM  DS) 800-160 MG tablet    Sig: Take 1 tablet by mouth 2 (two) times daily.    Dispense:  10 tablet    Refill:  0    No follow-ups on file.  Victorhugo Preis, MD

## 2024-05-02 LAB — URINALYSIS, ROUTINE W REFLEX MICROSCOPIC
Bilirubin Urine: NEGATIVE
Ketones, ur: NEGATIVE
Leukocytes,Ua: NEGATIVE
Nitrite: NEGATIVE
Specific Gravity, Urine: 1.025 (ref 1.000–1.030)
Total Protein, Urine: NEGATIVE
Urine Glucose: NEGATIVE
Urobilinogen, UA: 0.2 (ref 0.0–1.0)
pH: 6 (ref 5.0–8.0)

## 2024-05-02 LAB — URINE CULTURE
MICRO NUMBER:: 17133396
Result:: NO GROWTH
SPECIMEN QUALITY:: ADEQUATE

## 2024-05-06 ENCOUNTER — Ambulatory Visit: Payer: Self-pay | Admitting: Internal Medicine

## 2024-05-11 ENCOUNTER — Other Ambulatory Visit: Payer: Self-pay | Admitting: Family

## 2024-05-11 DIAGNOSIS — G2581 Restless legs syndrome: Secondary | ICD-10-CM

## 2024-05-13 ENCOUNTER — Other Ambulatory Visit: Payer: Self-pay | Admitting: Family

## 2024-05-13 DIAGNOSIS — F32A Depression, unspecified: Secondary | ICD-10-CM

## 2024-05-15 ENCOUNTER — Ambulatory Visit (INDEPENDENT_AMBULATORY_CARE_PROVIDER_SITE_OTHER): Admitting: Family

## 2024-05-15 ENCOUNTER — Encounter: Payer: Self-pay | Admitting: Family

## 2024-05-15 VITALS — BP 122/82 | HR 62 | Temp 97.9°F | Ht 65.0 in | Wt 254.6 lb

## 2024-05-15 DIAGNOSIS — R350 Frequency of micturition: Secondary | ICD-10-CM

## 2024-05-15 DIAGNOSIS — M5416 Radiculopathy, lumbar region: Secondary | ICD-10-CM | POA: Diagnosis not present

## 2024-05-15 DIAGNOSIS — R899 Unspecified abnormal finding in specimens from other organs, systems and tissues: Secondary | ICD-10-CM

## 2024-05-15 LAB — URINALYSIS, ROUTINE W REFLEX MICROSCOPIC
Bilirubin Urine: NEGATIVE
Ketones, ur: NEGATIVE
Nitrite: NEGATIVE
Specific Gravity, Urine: 1.01 (ref 1.000–1.030)
Total Protein, Urine: NEGATIVE
Urine Glucose: NEGATIVE
Urobilinogen, UA: 0.2 (ref 0.0–1.0)
pH: 6 (ref 5.0–8.0)

## 2024-05-15 NOTE — Assessment & Plan Note (Signed)
 Concern for lumbar stenosis based on recent MRI lumbar spine from 10/2023.  I suspect this is more the etiology of her pain than urinary symptoms at this time.  Initial improvement after [facility] 03/19/2024.  Follow-up with orthopedics who obtained left hip x-ray 04/23/2024.  She politely declines physical therapy at this time.  She is inclined to follow-up with Dr Avanell and she will call to schedule encouraged her to schedule Tylenol  500 mg twice daily and to take tramadol  for moderate to severe pain as prescribed by Dr. Avanell.

## 2024-05-15 NOTE — Progress Notes (Signed)
 Assessment & Plan:  Abnormal laboratory test -     Urinalysis, Routine w reflex microscopic; Future -     Urine Culture; Future  Urinary frequency Assessment & Plan: Recent urine culture negative.  Patient is taking Lasix  20 mg daily.  Question if component of overactive bladder.  Briefly discussed anticholinergic drug class.  Will await urine cultures and treatment of low back pain   Lumbar radiculitis Assessment & Plan: Concern for lumbar stenosis based on recent MRI lumbar spine from 10/2023.  I suspect this is more the etiology of her pain than urinary symptoms at this time.  Initial improvement after [facility] 03/19/2024.  Follow-up with orthopedics who obtained left hip x-ray 04/23/2024.  She politely declines physical therapy at this time.  She is inclined to follow-up with Dr Avanell and she will call to schedule encouraged her to schedule Tylenol  500 mg twice daily and to take tramadol  for moderate to severe pain as prescribed by Dr. Avanell.       Return precautions given.   Risks, benefits, and alternatives of the medications and treatment plan prescribed today were discussed, and patient expressed understanding.   Education regarding symptom management and diagnosis given to patient on AVS either electronically or printed.  No follow-ups on file.  [name], FNP  Subjective:    Patient ID: Myrical Andujo, female    DOB: September 23, 1950, 73 y.o.   MRN: [id number]  CC: Welda Azzarello is a 73 y.o. female who presents today for an acute visit.    HPI: HPI Discussed the use of AI scribe software for clinical note transcription with the patient, who gave verbal consent to proceed.  History of Present Illness   Novelle Addair is a 73 year old female who presents with urinary frequency and low back pain.  She experiences urinary frequency and urgency, with an inability to hold her urine and the need to urinate immediately upon feeling the urge. There is no dysuria.  She recently completed a course of Bactrim  for a urinary issue, but symptoms persist.  She takes Lasix  20 mg daily .Hydrochlorothiazide  has not been used in a long time.  She reports significant left low back pain, which has worsened recently. She has a history of lumbar fusion surgery approximately 10 years ago.  She received an epidural steroid injection in September 2025, which initially provided significant relief, but the pain has returned. Occasionally, she experiences numbness down her left leg. She is not currently taking any pain medication regularly, including Tylenol  or tramadol  50mg  from Dr Avanell, which was prescribed for as-needed use.  She mentions left hip pain, attributed to an incident at her grandson's wedding where she hurt herself while dancing recently.    Denies saddle anesthesia, urinary or fecal incontinence.  She is engaging in more physical activity, including walking.   Seen 05/01/2024 for suspected UTI.  Started on Bactrim  Urine culture reveals no growth; urinalysis without white blood cells, red blood cells  Follow up Dr Lorelle 04/23/24  Obtained left hip xray Dx with Left hip flexor tendinitis   04/15/24 Dr Avanell f/u lumbar radiculitis 03/19/2024: Left L2-3 and left L3-4 transforaminal ESI (over 90% relief)  MR lumbar spine 10/16/23   IMPRESSION: 1. Stable chronic lower lumbar spine degeneration at L4-L5 and L5-S1, the former chronically decompressed posteriorly. 2. Midline posterior L3-L4 posterior decompression since 2019, but severe residual facet and posterior element hypertrophy greater on the left. Severe new left foraminal stenosis, query left L3 radiculitis. Mild residual  spinal and lateral recess stenosis. 3. Progressed degeneration and spinal stenosis also at L1-L2 and L2-L3 since 2019, moderate at the latter. Increased right greater than left neural foraminal stenosis at those levels. Allergies: Hydrocodone , Amiodarone ,  Hydrocodone -acetaminophen , Tapentadol, Adhesive [tape], Latex, Oxycodone , and Wound dressing adhesive Current Outpatient Medications on File Prior to Visit  Medication Sig Dispense Refill   acetaminophen  (TYLENOL ) 500 MG tablet Take 2 tablets (1,000 mg total) by mouth every 8 (eight) hours. 30 tablet 0   ALPRAZolam  (XANAX ) 0.25 MG tablet TAKE 1 TABLET BY MOUTH TWICE A DAY AS NEEDED FOR ANXIETY 60 tablet 2   bisoprolol  (ZEBETA ) 10 MG tablet TAKE 1.5 TABLETS BY MOUTH 2 TIMES DAILY. 270 tablet 3   busPIRone  (BUSPAR ) 5 MG tablet Take 1 tablet (5 mg total) by mouth 3 (three) times daily as needed. 270 tablet 3   Calcium  Carbonate (CALCIUM  500 PO) Take 1 tablet by mouth daily.     Cholecalciferol  (VITAMIN D3) 5000 units CAPS Take 5,000 Units by mouth daily.     docusate sodium  (COLACE) 100 MG capsule Take 1 capsule (100 mg total) by mouth 2 (two) times daily.     ELIQUIS  5 MG TABS tablet TAKE 1 TABLET BY MOUTH TWICE A DAY 180 tablet 1   escitalopram  (LEXAPRO ) 20 MG tablet Take 1 tablet (20 mg total) by mouth daily. 90 tablet 3   famotidine  (PEPCID ) 20 MG tablet Take 1 tablet (20 mg total) by mouth 2 (two) times daily. 60 tablet 0   furosemide  (LASIX ) 20 MG tablet TAKE 1 TABLET (20 MG TOTAL) BY MOUTH DAILY. MAY TAKE ADDITIONAL 20MG  AS NEEDED FOR LEG SWELLING 180 tablet 3   KLOR-CON  M20 20 MEQ tablet TAKE 1 TABLET BY MOUTH EVERY DAY 90 tablet 3   losartan -hydrochlorothiazide  (HYZAAR) 50-12.5 MG tablet Take 0.5 tablets by mouth daily as needed (for BP 140/80). 30 tablet 1   metoCLOPramide  (REGLAN ) 10 MG tablet Take 1 tablet (10 mg total) by mouth every 6 (six) hours as needed. 30 tablet 0   Multiple Vitamin (MULTIVITAMIN) tablet Take 1 tablet by mouth daily.     ondansetron  (ZOFRAN ) 4 MG tablet Take 1 tablet (4 mg total) by mouth every 6 (six) hours as needed for nausea. 20 tablet 0   ranolazine  (RANEXA ) 500 MG 12 hr tablet TAKE 1 TABLET BY MOUTH TWICE A DAY 180 tablet 3   rOPINIRole  (REQUIP ) 0.5 MG  tablet Take 0.5 mg by mouth at bedtime.     rOPINIRole  (REQUIP ) 1 MG tablet Take 1 mg by mouth at bedtime.     rOPINIRole  (REQUIP ) 2 MG tablet TAKE 1 TABLET BY MOUTH EVERYDAY AT BEDTIME 90 tablet 3   rOPINIRole  (REQUIP ) 3 MG tablet Take 3 mg by mouth at bedtime.     rosuvastatin  (CRESTOR ) 40 MG tablet TAKE 1 TABLET BY MOUTH EVERY DAY 90 tablet 3   sulfamethoxazole -trimethoprim  (BACTRIM  DS) 800-160 MG tablet Take 1 tablet by mouth 2 (two) times daily. 10 tablet 0   traMADol  (ULTRAM ) 50 MG tablet 1/2-1 po qhs prn     No current facility-administered medications on file prior to visit.    Review of Systems  Constitutional:  Negative for chills and fever.  Respiratory:  Negative for cough.   Cardiovascular:  Negative for chest pain and palpitations.  Gastrointestinal:  Negative for nausea and vomiting.      Objective:    BP 122/82   Pulse 62   Temp 97.9 F (36.6 C) (Oral)   Ht  5' 5 (1.651 m)   Wt 254 lb 9.6 oz (115.5 kg)   SpO2 95%   BMI 42.37 kg/m   BP Readings from Last 3 Encounters:  05/15/24 122/82  05/01/24 124/70  04/18/24 (!) 170/89   Wt Readings from Last 3 Encounters:  05/15/24 254 lb 9.6 oz (115.5 kg)  05/01/24 258 lb 12.8 oz (117.4 kg)  04/18/24 249 lb (112.9 kg)    Physical Exam Vitals reviewed.  Constitutional:      Appearance: Normal appearance. She is well-developed.  Eyes:     Conjunctiva/sclera: Conjunctivae normal.  Cardiovascular:     Rate and Rhythm: Normal rate and regular rhythm.     Pulses: Normal pulses.     Heart sounds: Normal heart sounds.  Pulmonary:     Effort: Pulmonary effort is normal.     Breath sounds: Normal breath sounds. No wheezing, rhonchi or rales.  Abdominal:     General: Bowel sounds are normal. There is no distension.     Palpations: Abdomen is soft. Abdomen is not rigid. There is no fluid wave or mass.     Tenderness: There is no abdominal tenderness. There is no right CVA tenderness, left CVA tenderness, guarding or  rebound.  Musculoskeletal:       Arms:     Lumbar back: No swelling, edema, spasms, tenderness or bony tenderness. Normal range of motion.     Comments: Full range of motion with flexion, tension, lateral side bends. No bony tenderness. No pain, numbness, tingling elicited with single leg raise bilaterally.  Tenderness with palpation of left SI joint. Left Hip: No limp or waddling gait. Full ROM with flexion and hip rotation in flexion.    No pain of lateral hip with  (flexion-abduction-external rotation) test.   No pain with deep palpation of greater trochanter.     Skin:    General: Skin is warm and dry.  Neurological:     Mental Status: She is alert.     Sensory: No sensory deficit.     Deep Tendon Reflexes:     Reflex Scores:      Patellar reflexes are 2+ on the right side and 2+ on the left side.    Comments: Sensation and strength intact bilateral lower extremities.  Psychiatric:        Speech: Speech normal.        Behavior: Behavior normal.        Thought Content: Thought content normal.

## 2024-05-15 NOTE — Patient Instructions (Signed)
 Schedule Tylenol  500 mg twice daily.  May increase to Tylenol  1000 mg twice daily.  Maximum daily dose is 4000 mg in 24 hours.  Do not exceed this dose.  Please call Dr.Chasnis to schedule follow-up.  I suspect pain is more likely to be from your low back than hip or urine etiology.  Please let me know how you are doing if any acute changes

## 2024-05-15 NOTE — Assessment & Plan Note (Addendum)
 Recent urine culture negative.  Patient is taking Lasix  20 mg daily.  Question if component of overactive bladder.  Briefly discussed anticholinergic drug class.  Will await urine cultures and treatment of low back pain

## 2024-05-16 LAB — URINE CULTURE
MICRO NUMBER:: 17194607
Result:: NO GROWTH
SPECIMEN QUALITY:: ADEQUATE

## 2024-05-17 ENCOUNTER — Ambulatory Visit: Payer: Self-pay | Admitting: Family

## 2024-06-27 ENCOUNTER — Ambulatory Visit: Payer: Medicare Other | Admitting: Dermatology

## 2024-06-29 NOTE — Progress Notes (Unsigned)
 " Cardiology Office Note   Date: 07/01/2024  ID:  Amber Barnes 1950-11-23 [id number] PCP: Amber Rollene MATSU, FNP  [facility] Providers Cardiologist: Amber Lunger, Barnes Electrophysiologist:  Amber ONEIDA HOLTS, Barnes     Chief Complaint: Amber Barnes is a 73 y.o.female with PMH of HFpEF, persistent atrial fibrillation s/p ablation 12/12/2022, hypertension, hyperlipidemia, smoker, OSA not on CPAP, COPD, CKD, breast cancer, H. pylori who presents to the clinic for evaluation of chest discomfort.    Amber Barnes established care for hypertension and hyperlipidemia management in [place] in May 2016 after moving from [place].  January 2018 she was admitted for a foot surgery, found to be in new atrial fibrillation.  Started on diltiazem , bisoprolol , Eliquis , flecainide .  Echo showed LVEF 55-60%, mild MR. Cardioverted 09/07/16 and 10/19/16 to NSR.  Flecainide  switched to amiodarone  with better rhythm control.  Stress test during this period low risk. 09/2017 had a questionable syncopal episode due to bradycardia, bisoprolol  dose reduced.  Back in a fib 10/2019, amiodarone  on hold due to GI issues.  Spontaneously converted to NSR.  Back in a fib in 08/2020, for which extra bisoprolol  dose resolved.    Diagnosed with breast cancer 01/2021, encouraged to follow with cardiology during chemotherapy treatments.  Echo 03/08/21 showed LVEF 60-65%, mild LVH, G1DD. Follow-up echos stable. Back in a fib in 04/2022, restarted on flecainide .  Echo 05/31/2022 LVEF 55-60%, mild LVH, G2DD.  ETT 06/14/22 inconclusive due to inadequate HR response.    Echo 10/20/2022 LVEF 45-50%, LV with global hypokinesis, RV function moderately reduced, ascending aorta 39 mm.  Stress test 11/01/2022 moderate to severe ischemia involving apical anterior, apical septal, apical segments.  This was considered intermediate risk.  Underwent atrial fibrillation ablation 12/12/22.  Echo next day LVEF 50-55%, mild LVH.  Perioperative period  complicated by pericarditis, treated with ibuprofen  and colchicine . Seen by Dr. Gollan July 2024, bisoprolol  dose decreased due to bradycardia.  Echo 08/26/23 LVEF 60-65% normal diastolic parameters, mild MR, normal aorta. Seen by EP 11/2023 and was doing well, recommended to follow-up with general cardiology.     History of Present Illness: Today she presents for chest discomfort evaluation.  2 weeks ago [place] when she had a sharp pain in the middle of her back between her shoulder blades that radiated to her chest.  She was sitting in the recliner when this occurred.  It spontaneously resolved after a couple of minutes.  She did notice that it  took her breath away.  It has not occurred again.  She is concerned that this could be her heart because her son just recently had a 5-vessel CABG.  Taking medications as prescribed though has not taken them this morning, BP elevated at office visit today.  Notes waking up late and rushing to appointment.  BP 120s-30s/70s-80s at home.  Has not taken as needed losartan -HCTZ or Lasix  in a long time.  Denies palpitations, DOE, new LE edema, abnormal bleeding.  Has not tolerated CPAP in the past, notes that her husband says she does not snore since they got a new bed that is adjustable.  ROS: Please see the history of present illness. All other systems reviewed and are negative.   Studies Reviewed: The following studies were personally reviewed today: Cardiac Studies & Procedures   ______________________________________________________________________________________________   STRESS TESTS  NM MYOCAR MULTI W/SPECT W 11/01/2022  Narrative   Abnormal pharmacologic myocardial perfusion stress test.   There is a moderate  in size, moderate to severe in severity he partially reversible defect involving the apical anterior, apical septal, and apical segments.  This is most consistent with ischemia and possible element of scar  but cannot rule out artifact.   Left ventricular systolic function is mildly reduced (LVEF 52% by Siemens calculation, 43% by QGS).  There is subtle hypokinesis of the apical septal Micra.   No significant coronary artery calcification is noted, though aortic atherosclerosis and aortic valve and mitral annular calcifications are noted.   This is an intermediate risk study.   ECHOCARDIOGRAM  ECHOCARDIOGRAM COMPLETE 08/26/2023  Narrative ECHOCARDIOGRAM REPORT    Patient Name:   Amber Barnes Date of Exam: 08/26/2023 Medical Rec #:  [id number]        Height:       65.0 in Accession #:    [id number]       Weight:       245.0 lb Date of Birth:  20-Jan-1951        BSA:          2.156 m Patient Age:    72 years         BP:           104/91 mmHg Patient Gender: F                HR:           55 bpm. Exam Location:  [facility]  Procedure: 2D Echo, Cardiac Doppler and Color Doppler (Both Spectral and Color Flow Doppler were utilized during procedure).  Indications:     Chest Pain  History:         Patient has prior history of Echocardiogram examinations, most recent 12/13/2022. CKD 2 and COPD, Arrythmias:Atrial Fibrillation; Risk Factors:Hypertension, Dyslipidemia, Former Smoker and Sleep Apnea.  Sonographer:     Amber Barnes RVT RCS Referring Phys:  [id number] Amber Barnes Diagnosing Phys: Amber Barnes  IMPRESSIONS   1. Left ventricular ejection fraction, by estimation, is 60 to 65%. The left ventricle has normal function. The left ventricle has no regional wall motion abnormalities. Left ventricular diastolic parameters were normal. 2. Right ventricular systolic function is normal. The right ventricular size is normal. There is normal pulmonary artery systolic pressure. The estimated right ventricular systolic pressure is 25.6 mmHg. 3. The mitral valve is normal in structure. Mild mitral valve regurgitation. No evidence of mitral stenosis. 4. The aortic valve is normal in structure. Aortic  valve regurgitation is not visualized. No aortic stenosis is present. Aortic valve mean gradient measures 5.0 mmHg. 5. The inferior vena cava is normal in size with greater than 50% respiratory variability, suggesting right atrial pressure of 3 mmHg.  FINDINGS Left Ventricle: Left ventricular ejection fraction, by estimation, is 60 to 65%. The left ventricle has normal function. The left ventricle has no regional wall motion abnormalities. The left ventricular internal cavity size was normal in size. There is no left ventricular hypertrophy. Left ventricular diastolic parameters were normal.  Right Ventricle: The right ventricular size is normal. No increase in right ventricular wall thickness. Right ventricular systolic function is normal. There is normal pulmonary artery systolic pressure. The tricuspid regurgitant velocity is 2.27 m/s, and with an assumed right atrial pressure of 5 mmHg, the estimated right ventricular systolic pressure is 25.6 mmHg.  Left Atrium: Left atrial size was normal in size.  Right Atrium: Right atrial size was normal in size.  Pericardium: There is no evidence of pericardial effusion.  Mitral Valve: The  mitral valve is normal in structure. Mild mitral annular calcification. Mild mitral valve regurgitation. No evidence of mitral valve stenosis.  Tricuspid Valve: The tricuspid valve is normal in structure. Tricuspid valve regurgitation is mild . No evidence of tricuspid stenosis.  Aortic Valve: The aortic valve is normal in structure. Aortic valve regurgitation is not visualized. No aortic stenosis is present. Aortic valve mean gradient measures 5.0 mmHg. Aortic valve peak gradient measures 9.9 mmHg. Aortic valve area, by VTI measures 1.86 cm.  Pulmonic Valve: The pulmonic valve was normal in structure. Pulmonic valve regurgitation is not visualized. No evidence of pulmonic stenosis.  Aorta: The aortic root is normal in size and structure.  Venous: The inferior  vena cava is normal in size with greater than 50% respiratory variability, suggesting right atrial pressure of 3 mmHg.  IAS/Shunts: No atrial level shunt detected by color flow Doppler.  Additional Comments: 3D imaging was not performed.   LEFT VENTRICLE PLAX 2D LVIDd:         4.80 cm   Diastology LVIDs:         3.50 cm   LV e' medial:    10.10 cm/s LV PW:         1.30 cm   LV E/e' medial:  7.6 LV IVS:        1.20 cm   LV e' lateral:   9.60 cm/s LVOT diam:     1.80 cm   LV E/e' lateral: 8.0 LV SV:         68 LV SV Index:   32 LVOT Area:     2.54 cm   RIGHT VENTRICLE            IVC RV S prime:     9.49 cm/s  IVC diam: 1.80 cm TAPSE (M-mode): 2.1 cm  LEFT ATRIUM             Index LA diam:        4.20 cm 1.95 cm/m LA Vol (A2C):   71.7 ml 33.25 ml/m LA Vol (A4C):   69.5 ml 32.21 ml/m LA Biplane Vol: 70.3 ml 32.60 ml/m AORTIC VALVE                     PULMONIC VALVE AV Area (Vmax):    1.84 cm      PV Vmax:          0.96 m/s AV Area (Vmean):   1.79 cm      PV Peak grad:     3.7 mmHg AV Area (VTI):     1.86 cm      PR End Diast Vel: 7.29 msec AV Vmax:           157.00 cm/s AV Vmean:          109.000 cm/s AV VTI:            0.367 m AV Peak Grad:      9.9 mmHg AV Mean Grad:      5.0 mmHg LVOT Vmax:         113.50 cm/s LVOT Vmean:        76.800 cm/s LVOT VTI:          0.268 m LVOT/AV VTI ratio: 0.73  AORTA Ao Root diam: 3.20 cm Ao Asc diam:  3.20 cm  MITRAL VALVE               TRICUSPID VALVE MV Area (PHT): 3.91 cm    TR  Peak grad:   20.6 mmHg MV Decel Time: 194 msec    TR Vmax:        227.00 cm/s MV E velocity: 77.00 cm/s MV A velocity: 73.40 cm/s  SHUNTS MV E/A ratio:  1.05        Systemic VTI:  0.27 m Systemic Diam: 1.80 cm  Amber Barnes Electronically signed by Amber Barnes Signature Date/Time: 08/26/2023/3:12:45 PM    Final          ______________________________________________________________________________________________     EKG  Interpretation Date/Time:  Monday July 01 2024 10:49:12 EST Ventricular Rate:  73 PR Interval:  190 QRS Duration:  86 QT Interval:  418 QTC Calculation: 460 R Axis:   -9 Text Interpretation: Normal sinus rhythm Normal ECG When compared with ECG of 25-Aug-2023 10:31, Confirmed by Nola Numbers ([id number]) on 07/01/2024 10:52:49 AM  Physical Exam: VS: BP (!) 150/80 (BP Location: Left Arm, Patient Position: Sitting, Cuff Size: Normal)   Pulse 73   Ht 5' 5 (1.651 m)   Wt 261 lb 6.4 oz (118.6 kg)   SpO2 96%   BMI 43.50 kg/m   GEN: Well nourished, in NAD HEENT: Normal NECK: No JVD LYMPHATICS: No lymphadenopathy CARDIAC: RRR, no murmurs, rubs, gallops RESPIRATORY: Clear to auscultation without rales, wheezing or rhonchi  ABDOMEN: Soft, non-tender, non-distended MUSCULOSKELETAL: Bilateral non-pitting LE edema, no deformity  SKIN: Warm and dry NEUROLOGIC:  Alert and oriented x 3 PSYCHIATRIC:  Pleasant, normal affect   Assessment & Plan: 1. Chest pain: Echo 10/20/2022 LVEF 45-50%. Stress test 11/01/2022 intermediate risk.  Most recent echo 08/26/2023 LVEF recovered to 60-65%.  - Obtain cardiac PET stress to rule-out ischemia  - CBC today prior to testing - Continue Ranexa  500 mg BID   2. PAF: EKG today NSR 73 bpm. Denies associated symptoms, tells me she knows exactly when she goes into a fib. CHA2DS2-VASc=4.8%. - Continue bisoprolol  15 mg BID - Continue Eliquis  5 mg twice daily  3. HFpEF: Most recent echo 08/26/23 LVEF 60-65%, normal diastolic parameters, mild MR. 1/74/7974 creatinine 0.80, potassium 4.1. Denies DOE, PND, LE edema. Has not needed extra diuretic in a long time. Appears euvolemic on exam.  - Continue Lasix  20 mg daily - can take extra dose with KCl as needed for LE edema - Continue KCl supplementation  4. Hypertension: BP today 150/80, but she has not taken her medications yet and she notes waking up late and rushing to this appointment. BP 120s-130s/70s-80s at home.  -  Continue losartan -HCTZ 50-12.5 mg daily as needed for BP greater than 140/80 - has not needed this in a long time  5. Hyperlipidemia: 12/14/22 LDL 43, HDL 48, TGs 51, total 101.  02/15/2024 AST 19, ALT 13. She is fasting today. - Check fasting lipid panel today - Continue rosuvastatin  40 mg daily    Informed Consent   Shared Decision Making/Informed Consent The risks [chest pain, shortness of breath, cardiac arrhythmias, dizziness, blood pressure fluctuations, myocardial infarction, stroke/transient ischemic attack, nausea, vomiting, allergic reaction, radiation exposure, metallic taste sensation and life-threatening complications (estimated to be 1 in 10,000)], benefits (risk stratification, diagnosing coronary artery disease, treatment guidance) and alternatives of a cardiac PET stress test were discussed in detail with Ms. Kath and she agrees to proceed.    Dispo: Follow-up in 6 weeks after cardiac PET stress.  Signed, Numbers GORMAN Nola, NP 07/01/2024 12:35 PM [facility] "

## 2024-07-01 ENCOUNTER — Encounter: Payer: Self-pay | Admitting: Cardiology

## 2024-07-01 ENCOUNTER — Ambulatory Visit: Attending: Cardiology

## 2024-07-01 VITALS — BP 150/80 | HR 73 | Ht 65.0 in | Wt 261.4 lb

## 2024-07-01 DIAGNOSIS — I4819 Other persistent atrial fibrillation: Secondary | ICD-10-CM | POA: Diagnosis not present

## 2024-07-01 DIAGNOSIS — I10 Essential (primary) hypertension: Secondary | ICD-10-CM | POA: Diagnosis not present

## 2024-07-01 DIAGNOSIS — E782 Mixed hyperlipidemia: Secondary | ICD-10-CM | POA: Diagnosis not present

## 2024-07-01 DIAGNOSIS — I5032 Chronic diastolic (congestive) heart failure: Secondary | ICD-10-CM | POA: Diagnosis not present

## 2024-07-01 DIAGNOSIS — R072 Precordial pain: Secondary | ICD-10-CM | POA: Diagnosis not present

## 2024-07-01 MED ORDER — APIXABAN 5 MG PO TABS: 5.0000 mg | ORAL_TABLET | Freq: Two times a day (BID) | ORAL | 3 refills | Status: AC

## 2024-07-01 NOTE — Patient Instructions (Signed)
 Medication Instructions:  Your physician recommends that you continue on your current medications as directed. Please refer to the Current Medication list given to you today.   *If you need a refill on your cardiac medications before your next appointment, please call your pharmacy*  Lab Work: Your provider would like for you to have following labs drawn today CBC, LIPID.   If you have labs (blood work) drawn today and your tests are completely normal, you will receive your results only by: MyChart Message (if you have MyChart) OR A paper copy in the mail If you have any lab test that is abnormal or we need to change your treatment, we will call you to review the results.  Testing/Procedures:    Please report to Radiology at the [facility] Main Entrance 30 minutes early for your test.  [address] [place], [place] [address]                         OR   Please report to Radiology at [facility] Main Entrance, medical mall, 30 mins prior to your test.  [address]  [place], [place]  How to Prepare for Your Cardiac PET/CT Stress Test:  Nothing to eat or drink, except water , 3 hours prior to arrival time.  NO caffeine /decaffeinated products, or chocolate 12 hours prior to arrival. (Please note decaffeinated beverages (teas/coffees) still contain caffeine ).  If you have caffeine  within 12 hours prior, the test will need to be rescheduled.  Medication instructions: Do not take erectile dysfunction medications for 72 hours prior to test (sildenafil, tadalafil) Do not take nitrates (isosorbide mononitrate, Ranexa ) the day before or day of test Do not take tamsulosin the day before or morning of test Hold theophylline containing medications for 12 hours. Hold Dipyridamole 48 hours prior to the test.  Diabetic Preparation: If able to eat breakfast prior to 3 hour fasting, you may take all medications, including your insulin. Do not worry if you  miss your breakfast dose of insulin - start at your next meal. If you do not eat prior to 3 hour fast-Hold all diabetes (oral and insulin) medications. Patients who wear a continuous glucose monitor MUST remove the device prior to scanning.  You may take your remaining medications with water .  NO perfume, cologne or lotion on chest or abdomen area. FEMALES - Please avoid wearing dresses to this appointment.  Total time is 1 to 2 hours; you may want to bring reading material for the waiting time.  IF YOU THINK YOU MAY BE PREGNANT, OR ARE NURSING PLEASE INFORM THE TECHNOLOGIST.  In preparation for your appointment, medication and supplies will be purchased.  Appointment availability is limited, so if you need to cancel or reschedule, please call the Radiology Department Scheduler at [phone] 24 hours in advance to avoid a cancellation fee of $100.00  What to Expect When you Arrive:  Once you arrive and check in for your appointment, you will be taken to a preparation room within the Radiology Department.  A technologist or Nurse will obtain your medical history, verify that you are correctly prepped for the exam, and explain the procedure.  Afterwards, an IV will be started in your arm and electrodes will be placed on your skin for EKG monitoring during the stress portion of the exam. Then you will be escorted to the PET/CT scanner.  There, staff will get you positioned on the scanner and obtain  a blood pressure and EKG.  During the exam, you will continue to be connected to the EKG and blood pressure machines.  A small, safe amount of a radioactive tracer will be injected in your IV to obtain a series of pictures of your heart along with an injection of a stress agent.    After your Exam:  It is recommended that you eat a meal and drink a caffeinated beverage to counter act any effects of the stress agent.  Drink plenty of fluids for the remainder of the day and urinate frequently for the  first couple of hours after the exam.  Your doctor will inform you of your test results within 7-10 business days.  For more information and frequently asked questions, please visit our website: [email or web address]  For questions about your test or how to prepare for your test, please call: Cardiac Imaging Nurse Navigators Office: [phone]   Follow-Up: At [facility], you and your health needs are our priority.  As part of our continuing mission to provide you with exceptional heart care, our providers are all part of one team.  This team includes your primary Cardiologist (physician) and Advanced Practice Providers or APPs (Physician Assistants and Nurse Practitioners) who all work together to provide you with the care you need, when you need it.  Your next appointment:   6 week(s)  Provider:   You may see Timothy Gollan, MD or one of the following Advanced Practice Providers on your designated Care Team:   Tylene Lunch, NP

## 2024-07-02 ENCOUNTER — Encounter: Payer: Self-pay | Admitting: Oncology

## 2024-07-02 ENCOUNTER — Ambulatory Visit: Payer: Self-pay

## 2024-07-02 LAB — COMPREHENSIVE METABOLIC PANEL WITH GFR
ALT: 10 IU/L (ref 0–32)
AST: 20 IU/L (ref 0–40)
Albumin: 4.2 g/dL (ref 3.8–4.8)
Alkaline Phosphatase: 81 IU/L (ref 49–135)
BUN/Creatinine Ratio: 19 (ref 12–28)
BUN: 21 mg/dL (ref 8–27)
Bilirubin Total: 0.5 mg/dL (ref 0.0–1.2)
CO2: 25 mmol/L (ref 20–29)
Calcium: 9.3 mg/dL (ref 8.7–10.3)
Chloride: 103 mmol/L (ref 96–106)
Creatinine, Ser: 1.1 mg/dL — ABNORMAL HIGH (ref 0.57–1.00)
Globulin, Total: 2.3 g/dL (ref 1.5–4.5)
Glucose: 86 mg/dL (ref 70–99)
Potassium: 4.8 mmol/L (ref 3.5–5.2)
Sodium: 142 mmol/L (ref 134–144)
Total Protein: 6.5 g/dL (ref 6.0–8.5)
eGFR: 54 mL/min/1.73 — ABNORMAL LOW

## 2024-07-04 LAB — CBC
Hematocrit: 40.3 % (ref 34.0–46.6)
Hemoglobin: 13.5 g/dL (ref 11.1–15.9)
MCH: 32.4 pg (ref 26.6–33.0)
MCHC: 33.5 g/dL (ref 31.5–35.7)
MCV: 97 fL (ref 79–97)
Platelets: 125 x10E3/uL — ABNORMAL LOW (ref 150–450)
RBC: 4.17 x10E6/uL (ref 3.77–5.28)
RDW: 12.8 % (ref 11.7–15.4)
WBC: 4 x10E3/uL (ref 3.4–10.8)

## 2024-07-04 LAB — LIPID PANEL
Chol/HDL Ratio: 1.9 ratio (ref 0.0–4.4)
Cholesterol, Total: 166 mg/dL (ref 100–199)
HDL: 87 mg/dL
LDL Chol Calc (NIH): 64 mg/dL (ref 0–99)
Triglycerides: 78 mg/dL (ref 0–149)
VLDL Cholesterol Cal: 15 mg/dL (ref 5–40)

## 2024-07-10 ENCOUNTER — Encounter: Payer: Self-pay | Admitting: Oncology

## 2024-07-12 ENCOUNTER — Encounter: Payer: Self-pay | Admitting: Oncology

## 2024-07-16 ENCOUNTER — Encounter: Payer: Self-pay | Admitting: Oncology

## 2024-07-17 ENCOUNTER — Ambulatory Visit
Admission: RE | Admit: 2024-07-17 | Discharge: 2024-07-17 | Disposition: A | Discharge status: Home / Self Care | Source: Ambulatory Visit | Attending: Oncology | Admitting: Oncology

## 2024-07-17 ENCOUNTER — Encounter: Payer: Self-pay | Admitting: Oncology

## 2024-07-17 DIAGNOSIS — Z171 Estrogen receptor negative status [ER-]: Secondary | ICD-10-CM | POA: Insufficient documentation

## 2024-07-17 DIAGNOSIS — C50411 Malignant neoplasm of upper-outer quadrant of right female breast: Secondary | ICD-10-CM | POA: Diagnosis present

## 2024-07-17 MED ORDER — GADOBUTROL 1 MMOL/ML IV SOLN
10.0000 mL | Freq: Once | INTRAVENOUS | Status: AC | PRN
  Administered 2024-07-17: 10 mL via INTRAVENOUS

## 2024-07-25 ENCOUNTER — Ambulatory Visit: Admitting: Dermatology

## 2024-07-30 ENCOUNTER — Encounter (HOSPITAL_COMMUNITY): Payer: Self-pay

## 2024-07-31 ENCOUNTER — Encounter: Payer: Self-pay | Admitting: Oncology

## 2024-08-01 ENCOUNTER — Ambulatory Visit
Admission: RE | Admit: 2024-08-01 | Discharge: 2024-08-01 | Disposition: A | Discharge status: Home / Self Care | Source: Ambulatory Visit

## 2024-08-01 ENCOUNTER — Other Ambulatory Visit: Payer: Self-pay | Admitting: Family

## 2024-08-01 ENCOUNTER — Encounter: Payer: Self-pay | Admitting: Oncology

## 2024-08-01 ENCOUNTER — Telehealth: Payer: Self-pay | Admitting: Family

## 2024-08-01 DIAGNOSIS — R072 Precordial pain: Secondary | ICD-10-CM | POA: Diagnosis not present

## 2024-08-01 DIAGNOSIS — M5416 Radiculopathy, lumbar region: Secondary | ICD-10-CM

## 2024-08-01 DIAGNOSIS — Z9889 Other specified postprocedural states: Secondary | ICD-10-CM | POA: Insufficient documentation

## 2024-08-01 LAB — NM PET CT CARDIAC PERFUSION MULTI W/ABSOLUTE BLOODFLOW
MBFR: 2.26
Nuc Rest EF: 59 %
Nuc Stress EF: 63 %
Peak HR: 81 {beats}/min
Rest HR: 65 {beats}/min
Rest MBF: 0.72 ml/g/min
Rest Nuclear Isotope Dose: 25.2 mCi
SRS: 0
SSS: 0
ST Depression (mm): 0 mm
Stress MBF: 1.63 ml/g/min
Stress Nuclear Isotope Dose: 25.2 mCi
TID: 1.04

## 2024-08-01 MED ORDER — RUBIDIUM RB82 GENERATOR (RUBYFILL)
25.0000 | PACK | Freq: Once | INTRAVENOUS | Status: AC
  Administered 2024-08-01: 24.24 via INTRAVENOUS

## 2024-08-01 MED ORDER — REGADENOSON 0.4 MG/5ML IV SOLN
INTRAVENOUS | Status: AC
  Filled 2024-08-01: qty 5

## 2024-08-01 MED ORDER — REGADENOSON 0.4 MG/5ML IV SOLN
0.4000 mg | Freq: Once | INTRAVENOUS | Status: AC
  Administered 2024-08-01: 0.4 mg via INTRAVENOUS
  Filled 2024-08-01: qty 5

## 2024-08-01 MED ORDER — RUBIDIUM RB82 GENERATOR (RUBYFILL)
25.0000 | PACK | Freq: Once | INTRAVENOUS | Status: AC
  Administered 2024-08-01: 25.21 via INTRAVENOUS

## 2024-08-01 NOTE — Telephone Encounter (Signed)
 Please call Dr. Avanell [phone].  I received a fax that a new referral need to be placed and Please submit to [email or web address]  Please let me know once taken care of  Pt has appt scheduled 08/12/24

## 2024-08-07 ENCOUNTER — Other Ambulatory Visit: Payer: Self-pay | Admitting: Family

## 2024-08-07 DIAGNOSIS — G2581 Restless legs syndrome: Secondary | ICD-10-CM

## 2024-08-12 ENCOUNTER — Ambulatory Visit: Admitting: Cardiology

## 2024-08-14 ENCOUNTER — Ambulatory Visit: Admitting: Cardiology

## 2024-08-14 ENCOUNTER — Encounter: Payer: Self-pay | Admitting: Cardiology

## 2024-08-14 VITALS — BP 132/78 | HR 64 | Ht 65.0 in | Wt 269.8 lb

## 2024-08-14 DIAGNOSIS — I480 Paroxysmal atrial fibrillation: Secondary | ICD-10-CM

## 2024-08-14 DIAGNOSIS — I5032 Chronic diastolic (congestive) heart failure: Secondary | ICD-10-CM

## 2024-08-14 DIAGNOSIS — Z171 Estrogen receptor negative status [ER-]: Secondary | ICD-10-CM

## 2024-08-14 DIAGNOSIS — I4819 Other persistent atrial fibrillation: Secondary | ICD-10-CM

## 2024-08-14 DIAGNOSIS — I10 Essential (primary) hypertension: Secondary | ICD-10-CM

## 2024-08-14 DIAGNOSIS — C50411 Malignant neoplasm of upper-outer quadrant of right female breast: Secondary | ICD-10-CM

## 2024-08-14 DIAGNOSIS — E782 Mixed hyperlipidemia: Secondary | ICD-10-CM

## 2024-08-14 DIAGNOSIS — I2510 Atherosclerotic heart disease of native coronary artery without angina pectoris: Secondary | ICD-10-CM

## 2024-08-14 NOTE — Patient Instructions (Signed)
 Medication Instructions:  Your physician recommends that you continue on your current medications as directed. Please refer to the Current Medication list given to you today.  *If you need a refill on your cardiac medications before your next appointment, please call your pharmacy*  Lab Work: No labs ordered today  If you have labs (blood work) drawn today and your tests are completely normal, you will receive your results only by: MyChart Message (if you have MyChart) OR A paper copy in the mail If you have any lab test that is abnormal or we need to change your treatment, we will call you to review the results.  Testing/Procedures: No test ordered today   Follow-Up: At [facility], you and your health needs are our priority.  As part of our continuing mission to provide you with exceptional heart care, our providers are all part of one team.  This team includes your primary Cardiologist (physician) and Advanced Practice Providers or APPs (Physician Assistants and Nurse Practitioners) who all work together to provide you with the care you need, when you need it.  Your next appointment:  3 month(s)  Provider:  Tylene Lunch, NP   We recommend signing up for the patient portal called MyChart.  Sign up information is provided on this After Visit Summary.  MyChart is used to connect with patients for Virtual Visits (Telemedicine).  Patients are able to view lab/test results, encounter notes, upcoming appointments, etc.  Non-urgent messages can be sent to your provider as well.   To learn more about what you can do with MyChart, go to [email or web address].

## 2024-08-14 NOTE — Progress Notes (Unsigned)
 " Cardiology Office Note   Date:  08/16/2024  ID:  Amber Barnes, DOB August 13, 1950, MRN [id number] PCP: Dineen Rollene MATSU, FNP  [facility] Providers Cardiologist:  Evalene Lunger, MD Electrophysiologist:  OLE ONEIDA HOLTS, MD (Inactive)     History of Present Illness Amber Barnes is a 74 y.o. female with a past medical history of paroxysmal atrial fibrillation with cardioversion (2018), hypertension, chronic diastolic congestive heart failure, obstructive sleep apnea, COPD, GERD, fatty liver disease, neuropathy, CKD, morbid obesity, arthritis, hyperlipidemia, migraines, history of breast cancer has been on maintenance treatment, returns clinic today for follow-up.   The patient establish care for hypertension hyperlipidemia management imbalance in May 2016 after moving from [place].  In January 2018 she was admitted for foot surgery found to be in new onset atrial fibrillation.  She was started on diltiazem , bisoprolol , apixaban , and flecainide .  Echocardiogram revealed an LVEF of 55-60%, mild MR.  She was cardioverted 09/07/2016 and 10/19/2016 to normal sinus rhythm.  Flecainide  was switched to amiodarone  for better rhythm control.  Stress testing completed during that time was considered low risk.  In 09/2017 she had questionable syncopal episode due to bradycardia, bisoprolol  was reduced.  She was back in atrial fibrillation/2021, amiodarone  was on hold due to GI issues.  She spontaneously converted to sinus rhythm.  She was back in A-fib 08/2020, for which extra bisoprolol  dose resolved.  She was diagnosed with breast cancer 01/2021 and during treatments per oncology she needed a repeat echocardiogram every 3 months and continued to have normal EF.  Echocardiogram 02/28/2021 showed an LVEF of 60 to 65%, mild LVH, G1 DD.  Follow-up echoes were stable.  She was back in A-fib in 04/2022, restarted on flecainide .  Echo 05/31/2022 revealed an LVEF 55 to 60%, mild LVH, G2 DD.  ETT 06/14/2022 was  inconclusive due to inadequate heart rate response.  Echocardiogram completed 10/20/2022 revealed LVEF of 45 to 50%, LV with global hypokinesis, RV function moderately reduced, ascending aorta 39 mm.  Stress testing 11/01/2022 moderate to severe ischemia involving the apical anterior, apical septal, and apical segments.  This was considered an indeterminate test.     She continues to remain symptomatic from her atrial fibrillation and atrial flutter was evaluated by EP in 07/31/2022.  At that time she was scheduled for coronary CTA and ablation at a place in May 2024.  She underwent atrial fibrillation ablation 12/12/2022.  Echo the next day the LVEF was 50-55%, mild LVH.  Perioperative period complicated by pericarditis, treated with ibuprofen  and colchicine .  Seen by Dr. Gollan July 2024, bisoprolol  dose was decreased due to bradycardia.  Echocardiogram 08/26/2023 revealed an LVEF of 60 to 65%, normal diastolic parameters, mild MR, normal aorta.  Seen by EP 11/2023 and was doing well, recommended to follow-up with general cardiology.  She was last seen in clinic 07/01/2024 evaluated for chest discomfort.  She was scheduled for cardiac PET stress to rule out ischemia and was sent for updated labs.  She was also started on Ranexa  500 mg twice daily.   She returns to clinic today stating overall she has been doing well from the cardiac perspective.  States that she has been compliant with her current medication regimen without any issues with bleeding with no blood noted in her stool or urine.  She denies any chest pain, shortness of breath, dyspnea on exertion, or peripheral edema.  Denies any recent hospitalizations or visits to the emergency department.  ROS: 10 point review of system has  been reviewed and considered negative the exception was been listed in HPI  Studies Reviewed     Cardiac Studies & Procedures    ______________________________________________________________________________________________   STRESS TESTS  NM PET CT CARDIAC PERFUSION MULTI W/ABSOLUTE BLOODFLOW 08/01/2024  Narrative   The study is normal. The study is low risk.   LV perfusion is normal. There is no evidence of ischemia. There is no evidence of infarction.   Rest left ventricular function is normal. Rest EF: 59%. Stress left ventricular function is normal. Stress EF: 63%. End diastolic cavity size is normal. End systolic cavity size is normal.   Myocardial blood flow was computed to be 0.5ml/g/min at rest and 1.59ml/g/min at stress. Global myocardial blood flow reserve was 2.26 and was normal.   Coronary calcium  was present on the attenuation correction CT images. Mild coronary calcifications were present. Coronary calcifications were present in the left anterior descending artery and right coronary artery distribution(s).   Electronically signed by Lonni Nanas, MD  EXAM: OVER-READ INTERPRETATION  CT CHEST  The following report is a limited chest CT over-read performed by radiologist Dr. Elsie Ko [facility] on 08/01/2024. This over-read does not include interpretation of cardiac or coronary anatomy or pathology nor does it include evaluation of the PET data. The cardiac PET-CT interpretation by the cardiologist is attached.  COMPARISON:  Chest CTA 08/25/2023.  FINDINGS: Mediastinum/Nodes: No enlarged lymph nodes within the visualized mediastinum.  Lungs/Pleura: There is no pleural effusion. The visualized lungs appear clear.  Upper abdomen: No significant findings in the visualized upper abdomen.  Musculoskeletal/Chest wall: No chest wall mass or suspicious osseous findings within the visualized chest on axial imaging. Incompletely visualized postsurgical changes laterally in the right breast, evaluated by recent breast MRI. Mild spondylosis.  IMPRESSION: 1. No  significant extracardiac findings within the visualized chest. 2. Incompletely visualized postsurgical changes laterally in the right breast, evaluated by recent breast MRI.   Electronically Signed By: Elsie Perone M.D. On: 08/01/2024 13:22   ECHOCARDIOGRAM  ECHOCARDIOGRAM COMPLETE 08/26/2023  Narrative ECHOCARDIOGRAM REPORT    Patient Name:   Amber Barnes Date of Exam: 08/26/2023 Medical Rec #:  [id number]        Height:       65.0 in Accession #:    [id number]       Weight:       245.0 lb Date of Birth:  Feb 17, 1951        BSA:          2.156 m Patient Age:    72 years         BP:           104/91 mmHg Patient Gender: F                HR:           55 bpm. Exam Location:  [facility]  Procedure: 2D Echo, Cardiac Doppler and Color Doppler (Both Spectral and Color Flow Doppler were utilized during procedure).  Indications:     Chest Pain  History:         Patient has prior history of Echocardiogram examinations, most recent 12/13/2022. CKD 2 and COPD, Arrythmias:Atrial Fibrillation; Risk Factors:Hypertension, Dyslipidemia, Former Smoker and Sleep Apnea.  Sonographer:     Tillman Nora RVT RCS Referring Phys:  [id number] ELSP[address] PARAS [place]WTON Diagnosing Phys: Evalene Lunger MD  IMPRESSIONS   1. Left ventricular ejection fraction, by estimation, is 60 to 65%. The left ventricle has normal function. The  left ventricle has no regional wall motion abnormalities. Left ventricular diastolic parameters were normal. 2. Right ventricular systolic function is normal. The right ventricular size is normal. There is normal pulmonary artery systolic pressure. The estimated right ventricular systolic pressure is 25.6 mmHg. 3. The mitral valve is normal in structure. Mild mitral valve regurgitation. No evidence of mitral stenosis. 4. The aortic valve is normal in structure. Aortic valve regurgitation is not visualized. No aortic stenosis is present. Aortic valve mean gradient measures 5.0 mmHg. 5. The  inferior vena cava is normal in size with greater than 50% respiratory variability, suggesting right atrial pressure of 3 mmHg.  FINDINGS Left Ventricle: Left ventricular ejection fraction, by estimation, is 60 to 65%. The left ventricle has normal function. The left ventricle has no regional wall motion abnormalities. The left ventricular internal cavity size was normal in size. There is no left ventricular hypertrophy. Left ventricular diastolic parameters were normal.  Right Ventricle: The right ventricular size is normal. No increase in right ventricular wall thickness. Right ventricular systolic function is normal. There is normal pulmonary artery systolic pressure. The tricuspid regurgitant velocity is 2.27 m/s, and with an assumed right atrial pressure of 5 mmHg, the estimated right ventricular systolic pressure is 25.6 mmHg.  Left Atrium: Left atrial size was normal in size.  Right Atrium: Right atrial size was normal in size.  Pericardium: There is no evidence of pericardial effusion.  Mitral Valve: The mitral valve is normal in structure. Mild mitral annular calcification. Mild mitral valve regurgitation. No evidence of mitral valve stenosis.  Tricuspid Valve: The tricuspid valve is normal in structure. Tricuspid valve regurgitation is mild . No evidence of tricuspid stenosis.  Aortic Valve: The aortic valve is normal in structure. Aortic valve regurgitation is not visualized. No aortic stenosis is present. Aortic valve mean gradient measures 5.0 mmHg. Aortic valve peak gradient measures 9.9 mmHg. Aortic valve area, by VTI measures 1.86 cm.  Pulmonic Valve: The pulmonic valve was normal in structure. Pulmonic valve regurgitation is not visualized. No evidence of pulmonic stenosis.  Aorta: The aortic root is normal in size and structure.  Venous: The inferior vena cava is normal in size with greater than 50% respiratory variability, suggesting right atrial pressure of 3  mmHg.  IAS/Shunts: No atrial level shunt detected by color flow Doppler.  Additional Comments: 3D imaging was not performed.   LEFT VENTRICLE PLAX 2D LVIDd:         4.80 cm   Diastology LVIDs:         3.50 cm   LV e' medial:    10.10 cm/s LV PW:         1.30 cm   LV E/e' medial:  7.6 LV IVS:        1.20 cm   LV e' lateral:   9.60 cm/s LVOT diam:     1.80 cm   LV E/e' lateral: 8.0 LV SV:         68 LV SV Index:   32 LVOT Area:     2.54 cm   RIGHT VENTRICLE            IVC RV S prime:     9.49 cm/s  IVC diam: 1.80 cm TAPSE (M-mode): 2.1 cm  LEFT ATRIUM             Index LA diam:        4.20 cm 1.95 cm/m LA Vol (A2C):   71.7 ml 33.25 ml/m LA Vol (  A4C):   69.5 ml 32.21 ml/m LA Biplane Vol: 70.3 ml 32.60 ml/m AORTIC VALVE                     PULMONIC VALVE AV Area (Vmax):    1.84 cm      PV Vmax:          0.96 m/s AV Area (Vmean):   1.79 cm      PV Peak grad:     3.7 mmHg AV Area (VTI):     1.86 cm      PR End Diast Vel: 7.29 msec AV Vmax:           157.00 cm/s AV Vmean:          109.000 cm/s AV VTI:            0.367 m AV Peak Grad:      9.9 mmHg AV Mean Grad:      5.0 mmHg LVOT Vmax:         113.50 cm/s LVOT Vmean:        76.800 cm/s LVOT VTI:          0.268 m LVOT/AV VTI ratio: 0.73  AORTA Ao Root diam: 3.20 cm Ao Asc diam:  3.20 cm  MITRAL VALVE               TRICUSPID VALVE MV Area (PHT): 3.91 cm    TR Peak grad:   20.6 mmHg MV Decel Time: 194 msec    TR Vmax:        227.00 cm/s MV E velocity: 77.00 cm/s MV A velocity: 73.40 cm/s  SHUNTS MV E/A ratio:  1.05        Systemic VTI:  0.27 m Systemic Diam: 1.80 cm  Evalene Lunger MD Electronically signed by Evalene Lunger MD Signature Date/Time: 08/26/2023/3:12:45 PM    Final          ______________________________________________________________________________________________      Risk Assessment/Calculations  CHA2DS2-VASc Score = 4   This indicates a 4.8% annual risk of stroke. The  patient's score is based upon: CHF History: 1 HTN History: 1 Diabetes History: 0 Stroke History: 0 Vascular Disease History: 0 Age Score: 1 Gender Score: 1         Physical Exam VS:  BP 132/78 (BP Location: Left Arm, Patient Position: Sitting, Cuff Size: Large)   Pulse 64   Ht 5' 5 (1.651 m)   Wt 269 lb 12.8 oz (122.4 kg)   SpO2 95%   BMI 44.90 kg/m        Wt Readings from Last 3 Encounters:  08/14/24 269 lb 12.8 oz (122.4 kg)  07/01/24 261 lb 6.4 oz (118.6 kg)  05/15/24 254 lb 9.6 oz (115.5 kg)    GEN: Well nourished, well developed in no acute distress NECK: No JVD; No carotid bruits CARDIAC: RRR, no murmurs, rubs, gallops RESPIRATORY:  Clear to auscultation without rales, wheezing or rhonchi  ABDOMEN: Soft, non-tender, non-distended EXTREMITIES:  No edema; No deformity   ASSESSMENT AND PLAN Coronary calcifications noted on CT images in the LAD and RCA distribution area with recent cardiac PET stress completed due to continuing precordial pain.  This study was normal and considered low risk.  There is no evidence of ischemia and no evidence of infarct.  Myocardial blood flow was 2.26 and was normal.  She is continued on Ranexa  500 milligrams twice daily, apixaban  and lieu of aspirin , and rosuvastatin  40 mg daily.  No further ischemic evaluation is needed at this time.  Paroxysmal atrial fibrillation where she is noted to be in sinus rhythm today.  She continues to deny associated symptoms.  She continues on apixaban  5 mg twice daily for CHA2DS2-VASc score of at least 4 for stroke prophylaxis as well as bisoprolol  50 mg twice daily.  HFpEF with most recent echocardiogram revealed an LVEF 60 to 65%, normal diastolic parameters, mild MR.  She appears to be euvolemic on exam.  She has as needed furosemide  which she is not taking.  She continues to suffer from NYHA class I-II symptoms.  She is also continued on losartan .  Can consider adding SGLT2 inhibitor or MRA with discussion  with further medications declined will revisit on return appointment again.  Hypertension with a blood pressure today 132/78.  Blood pressures remain stable.  She is continued on losartan /HCTZ 50-12.5 mg daily and Zebeta  15 mg and furosemide .  She has been encouraged to continue to monitor pressures 1 to 2 hours postmedication administration at home as well.  Hyperlipidemia with last LDL 64 which remains at goal.  She has been continued on rosuvastatin  40 mg daily.  This continues to be monitored by her PCP.  History of HDR2-positive carcinoma of the breast.  She recently finished treatment in January 2024.  Last echocardiogram completed 08/2023 revealed an LVEF of 60 to 65%, no RWMA, mild mitral regurgitation.  She continues to be followed by oncology.       Dispo: Patient  to return to clinic for MD/APP in 3 months or sooner if needed for further evaluation.  Signed, Socorro Ebron, NP   "

## 2024-08-16 ENCOUNTER — Inpatient Hospital Stay: Admitting: Oncology

## 2024-08-16 ENCOUNTER — Encounter: Payer: Self-pay | Admitting: Oncology

## 2024-08-16 ENCOUNTER — Inpatient Hospital Stay

## 2024-08-16 VITALS — BP 145/89 | HR 56 | Temp 97.3°F | Resp 18 | Wt 266.6 lb

## 2024-08-16 DIAGNOSIS — G629 Polyneuropathy, unspecified: Secondary | ICD-10-CM

## 2024-08-16 DIAGNOSIS — C50411 Malignant neoplasm of upper-outer quadrant of right female breast: Secondary | ICD-10-CM

## 2024-08-16 LAB — CBC WITH DIFFERENTIAL (CANCER CENTER ONLY)
Abs Immature Granulocytes: 0.01 10*3/uL (ref 0.00–0.07)
Basophils Absolute: 0 10*3/uL (ref 0.0–0.1)
Basophils Relative: 1 %
Eosinophils Absolute: 0.1 10*3/uL (ref 0.0–0.5)
Eosinophils Relative: 1 %
HCT: 39.8 % (ref 36.0–46.0)
Hemoglobin: 13.5 g/dL (ref 12.0–15.0)
Immature Granulocytes: 0 %
Lymphocytes Relative: 26 %
Lymphs Abs: 0.9 10*3/uL (ref 0.7–4.0)
MCH: 31.7 pg (ref 26.0–34.0)
MCHC: 33.9 g/dL (ref 30.0–36.0)
MCV: 93.4 fL (ref 80.0–100.0)
Monocytes Absolute: 0.3 10*3/uL (ref 0.1–1.0)
Monocytes Relative: 7 %
Neutro Abs: 2.3 10*3/uL (ref 1.7–7.7)
Neutrophils Relative %: 65 %
Platelet Count: 155 10*3/uL (ref 150–400)
RBC: 4.26 MIL/uL (ref 3.87–5.11)
RDW: 13.2 % (ref 11.5–15.5)
WBC Count: 3.6 10*3/uL — ABNORMAL LOW (ref 4.0–10.5)
nRBC: 0 % (ref 0.0–0.2)

## 2024-08-16 LAB — CMP (CANCER CENTER ONLY)
ALT: 11 U/L (ref 0–44)
AST: 19 U/L (ref 15–41)
Albumin: 4.1 g/dL (ref 3.5–5.0)
Alkaline Phosphatase: 75 U/L (ref 38–126)
Anion gap: 13 (ref 5–15)
BUN: 15 mg/dL (ref 8–23)
CO2: 23 mmol/L (ref 22–32)
Calcium: 9.2 mg/dL (ref 8.9–10.3)
Chloride: 104 mmol/L (ref 98–111)
Creatinine: 0.93 mg/dL (ref 0.44–1.00)
GFR, Estimated: >60 mL/min
Glucose, Bld: 94 mg/dL (ref 70–99)
Potassium: 4.3 mmol/L (ref 3.5–5.1)
Sodium: 140 mmol/L (ref 135–145)
Total Bilirubin: 0.6 mg/dL (ref 0.0–1.2)
Total Protein: 6.7 g/dL (ref 6.5–8.1)

## 2024-08-16 NOTE — Progress Notes (Signed)
 " Hematology/Oncology Progress note [phone]) [phone] [phone]) [phone]     CHIEF COMPLAINTS/REASON FOR VISIT:  Follow-up for stage I HER2 positive breast cancer  ASSESSMENT & PLAN:   Cancer Staging  Malignant neoplasm of upper-outer quadrant of right female breast [facility]) Staging form: Breast, AJCC 8th Edition - Pathologic stage from 03/03/2021: Stage IA (pT1b, pN0, cM0, G3, ER-, PR+, HER2+) - Signed by [name], MD on 03/03/2021   Malignant neoplasm of upper-outer quadrant of right female breast [facility]) #Right breast invasive carcinoma pT1b pN0 ER negative, PR weakly positive HER 2 positive.  Not on endocrine therapy due to unclear magnitude of benefit Patient does not tolerate adjuvant chemotherapy with Taxol  and trastuzumab  despite Taxol  dose reduction.-->Status post adjuvant radiation--> 07/27/2022 Finished maintenance Trastuzumab  Labs are reviewed and discussed with patient. She is doing well clinically.  Mammogram and MRI breasts findings are reviewed, multiple enhancing masses/lesions in the right breast, similar to previous biopsied fat necrosis lesion. Recommend repeat MRI breast in 6 months, annual screening mammogram   Neuropathy Pre-existing neuropathy in her fingertips bilaterally- secondary to cervical spine radiculopathy Currently she is off gabapentin .   Follow up in 6 months  [name], MD, PhD [facility] Hematology Oncology 08/16/2024     HISTORY OF PRESENTING ILLNESS:   Amber Barnes is a  74 y.o.  female presents for follow up of right breast cancer.  Oncology History  Malignant neoplasm of upper-outer quadrant of right female breast (HCC)  01/19/2021 Initial Diagnosis   Breast cancer in female  -12/30/2020, patient had a screening mammogram done which showed possible mass and asymmetry in the right breast. 01/08/2021, diagnostic mammogram of the right breast showed 2 adjacent irregular spiculated masses.  Larger 1 was 10 mm and a smaller mass measured  5 mm. 01/15/2021 stereotactic biopsy of the right breast smaller mass showed invasive carcinoma no special type, grade 2, DCIS present, LVI negative.Ultrasound guided biopsy of the larger mass showed invasive mammary carcinoma  02/17/2021, patient is status post lumpectomy and sentinel lymph node biopsy Pathology showed invasive mammary carcinoma, 2 separate foci, DCIS, negative sentinel lymph node biopsy 0/1, grade 3, or margins negative for invasive carcinoma.  ER -, PR + [1-10%], HER2 +  pT1b(m) pN0   Strong family history of cancer, Discussed again about genetic testing. patient adamantly declined.   03/03/2021 Cancer Staging   Staging form: Breast, AJCC 8th Edition - Pathologic stage from 03/03/2021: Stage IA (pT1b, pN0, cM0, G3, ER-, PR+, HER2+) - Signed by [name], MD on 03/03/2021 Stage prefix: Initial diagnosis Multigene prognostic tests performed: None Histologic grading system: 3 grade system   03/08/2021 Echocardiogram   echocardiogram showed LVEF 60-65%. Per patient, she has been seen by Dr. Tresia team and was cleared for proceeding with chemotherapy.    02/2021 Genetic Testing   Strong family history of cancer, Discussed again about genetic testing. patient adamantly declined.   03/23/2021 Procedure   Mediport by vascular surgeon   03/24/2021 -  Chemotherapy   03/24/2021 - 03/31/2021 Taxol  80 mg/m2 /trastuzumab  04/05/2021 - 04/09/2021 patient was admitted due to neutropenic fever with associated SIRS.  Patient was treated with empiric cefepime  and vancomycin  until date of discharge when ANC recovered to 1.4. Post hospitalization, patient also had COVID-19 infection so her follow-up appointment was further postponed for another 2 weeks.  05/10/2021 resumed dose reduce Taxol  65mg /m2 /trastuzumab  05/25/2021-06/30/2021, cycle 2 dose reduced Taxol  65mg /m2 /trastuzumab    admitted from 07/12/2021 - 07/17/2021 due to acute pyelonephritis UVJ obstruction and  a right hydro ureteralnephrosis.   Patient was treated with IV antibiotics status post emergent bilateral ureteral stent placement.  Discharged home with a course of oral antibiotics.  Shared decision made to hold chemotherapy and proceed with radiation.    08/18/2021 - 09/08/2021 Radiation Therapy    adjuvant breast radiation   10/06/2021 -  Chemotherapy   10/06/2021, shared decision was made to proceed with maintenance trastuzumab .  Her cardiologist Dr. Gollan gave clearance to proceed. Plan Transtuzumab for up to a year if tolerates.    12/14/2021 Mammogram   Bilateral diagnostic mammogram 1. Probably benign right breast calcifications along the posterior lumpectomy bed felt to represent early changes of fat necrosis.Recommend short-term imaging follow-up. 2. No mammographic evidence of malignancy on the left.   03/02/2022 Echocardiogram   LVEF 60-65%, grade I diastolic dysfuction   05/31/2022 Echocardiogram   LVEF is 55 to 60%, Grade II diastolic dysfunction    10/20/2022 Echocardiogram   LVEF 45-50%   12/28/2022 Mammogram   Bilateral diagnostic mammogram showed There is an indeterminate 8 mm mass in the RIGHT upper inner breast  S/p US  guided biopsy of right breast lesion.  Pathology showed Fat necrosis, negative for malignancy.  Radiology feels this results is concordant.    07/07/2023 Mammogram   Unilateral diagnostic mammogram -right 1. No evidence of malignancy. 2. The previously described probably benign right breast calcifications are benign calcifications associated with fat necrosis and oil cyst formation.   12/29/2023 Mammogram   Bilateral diagnostic mammogram Interval increase of irregular hypoechoic mass in the right breast 1 o'clock position.  Mass was previously biopsied in June 2024 with concordant pathology of benign fat necrosis.  Given the interval increase in size, recommend bilateral breast MRI   01/09/2024 Imaging   Bilateral MRI breast with and without contrast showed  Multiple masslike area of  abnormal enhancement in the right breast the dominant area surrounding the lumpectomy scar extending to the skin surface.  No left breast malignancy.  No enlarged abnormal lymph node.    01/11/2024 Procedure   Patient underwent right breast biopsy of 10:00 and 1:00  1. Breast, right, needle core biopsy, 10:00 13cmfn (venus clip) :       -  EXTENSIVE FAT NECROSIS WITH FIBROSIS, CHRONIC INFLAMMATION AND ASSOCIATED       CALCIFICATIONS.        2. Breast, right, needle core biopsy, 1:00 9cmfn (ribbon clip) :       -  EXTENSIVE FAT NECROSIS WITH FIBROSIS, CHRONIC INFLAMMATION AND ASSOCIATED       CALCIFICATIONS.    HER2-positive carcinoma of breast [facility])   October 2024 left hip replacement.  March patient underwent left knee total knee replacement.  Patient was hospitalized for 10/16/2023 - 10/21/2023 due to prosthetic joint infection, Methicillin-resistant Staphylococcus lugdunensis.  Status post left hip revision arthroplasty.   INTERVAL HISTORY Amber Barnes is a 74 y.o. female who has above history reviewed by me today presents for follow up visit for management of right side HER 2 positive breast cancer She reports doing well currently. She has no new breast concerns.  Chronic neuropathy symptoms of fingertips, stable, manageable without gabapentin .    Review of Systems  Constitutional:  Positive for fatigue. Negative for appetite change, chills, fever and unexpected weight change.  HENT:   Negative for hearing loss and voice change.   Respiratory:  Negative for chest tightness and cough.   Cardiovascular:  Negative for chest pain and leg swelling.  Gastrointestinal:  Negative  for abdominal distention, abdominal pain, blood in stool, nausea and vomiting.  Endocrine: Negative for hot flashes.  Genitourinary:  Negative for difficulty urinating, frequency and hematuria.   Musculoskeletal:  Positive for arthralgias.  Skin:  Negative for itching and rash.  Neurological:  Positive for  numbness. Negative for dizziness and extremity weakness.  Hematological:  Negative for adenopathy.  Psychiatric/Behavioral:  Negative for confusion.     MEDICAL HISTORY:  Past Medical History:  Diagnosis Date   Achilles tendinitis    Acute medial meniscal tear    Allergy    Anemia    Anxiety    a.) on BZO PRN (alprazolam )   Aortic atherosclerosis    Ascending aorta dilatation 10/20/2022   a.) TTE 10/20/2022: asc Ao 39 mm   Atrial fibrillation/flutter (HCC) 07/2016   a.) Dx'd 07/2016; b.) CHA2DS2-VASc = 5 (age, sex, HFmrEF, HTN, vasc disease history) as of 05/09/2023; c.) s/p DCCV 09/07/2016 (150 J x 2 --> A.flutter; 200 J x 1 --> NSR); d.) s/p DCCV 10/19/2016 (150 J x 1 --> NSR); e.) brought in for DCCV 12/2016; cancelled d/t NSR; f.) s/p PVI ablation 12/12/2022; g.) cardiac rate/rhythm maintained on oral bisoprolol ; chronically anticoagulated using apixaban    BMI 50.0-59.9, adult (HCC) 07/16/2018   CAD (coronary artery disease)    a.) cCTA 12/08/2022: Ca2+ = 31.6 (58th %'ile for age/sex/race match control)   Cardiomegaly    Chemotherapy induced nausea and vomiting 05/18/2021   Chronic heart failure with mid-range ejection fraction (HFmrEF) (HCC)    a.) TTE 03/08/21: EF 60-65, mild LVH, G1DD, mild LAE, triv MR; b.) TTE 07/29/21: EF 60-65, G1DD, mild LAE, triv MR; c.) TTE 11/26/21: EF 55-60, G1DD, mild LAE, mild MR; d.) TTE 02/25/22: EF 60-65, G1DD, mild LAE, mild MR; e.) TTE 05/31/22: EF 55-60, mild LVH, G2DD, mild-mod MR; f.) TTE 10/20/22: EF 45-50, mild LAE, mod red RVSF, mild-mod TR, Asc Ao 39; g.) TTE 12/13/22: EF 50-55, mild LVH, mild MAC   CKD (chronic kidney disease), stage II    Colon polyp    Complication of anesthesia    a.) emergence delirium; b.) hypoxia after hip replacement   COPD (chronic obstructive pulmonary disease) (HCC)    DDD (degenerative disc disease), lumbar    a.) s/p fusion   Depression    Endometriosis    Family history of CLL (chronic lymphoid leukemia)  03/21/2020   Fibrocystic breast disease    GERD (gastroesophageal reflux disease)    History of kidney stones    History of stress test    a.) MV 07/29/2016: EF 55-65%. Small, mild defect  in mid anteroseptal, apical septal, and apical locations. No ischemia-->low risk; b.) MV 10/2022: mod sized partially rev apical ant, apical septal, and apical defect. EF 52%. No signific cor Ca2+; c.) cCTA 12/08/2022: Ca2+ = 31.6 (58th %'ile).   History of tobacco use    Hyperlipidemia    Hypertension    Hypothyroidism    a.) s/p partial thyroidectomy for nodule; pathology benign   Insomnia    Left knee pain, unspecified chronicity    Lipoma    Malignant neoplasm of upper-outer quadrant of right female breast (HCC) 03/03/2021   a.) stage 1A invasive mammary carcinoma (cT1b or T1c N0); ER (-), PR (+), HER2/neu (+); did not tolerate adjuvant chemotherapy (paclitacel + trastuzumab ) despite paclitaxel  dose reduction; s/p adjuvant XRT; cinished maintenance trastuzumab  07/27/2022; no adjuvant endocrine therapy as no clear benefit   Migraine    Nephrolithiasis    On apixaban   therapy    OSA (obstructive sleep apnea)    a.) non-compliant with nocturnal PAP therapy   Osteoarthritis    Osteopenia    PAF (paroxysmal atrial fibrillation) (HCC) 12/13/2022   Peptic ulcer disease    a.) H. pylori   Pericarditis 12/12/2022   a.) following A.fib ablation; resolved   Pneumonia    Port-A-Cath in place    Primary localized osteoarthritis of pelvic region and thigh 03/02/2022   RLS (restless legs syndrome)    a.) on ropinirole    S/P TKR (total knee replacement), left 09/11/2023   S/P total left hip arthroplasty 05/11/2023   Vertigo 06/08/2016   Vitamin D  deficiency     SURGICAL HISTORY: Past Surgical History:  Procedure Laterality Date   ABDOMINAL HYSTERECTOMY     ovaries left   ACHILLES TENDON SURGERY     2012,01/2017   ATRIAL FIBRILLATION ABLATION N/A 12/12/2022   Procedure: ATRIAL FIBRILLATION ABLATION;   Surgeon: Cindie Ole DASEN, MD;  Location: [facility];  Service: Cardiovascular;  Laterality: N/A;   BREAST BIOPSY Right 01/15/2021   Affirm biopsy-X clip-INVASIVE MAMMARY CARCINOMA   BREAST BIOPSY Right 01/15/2021   u/s bx-venus clip INVASIVE MAMMARY CARCINOMA   BREAST BIOPSY Right 01/11/2024   us  bx x 2. 10:00 13cfm venus marker, 1:00 9 cmfn ribbon marker, paths pending   BREAST BIOPSY Right 01/04/2023   u/s bx ribbon clip path pending   BREAST BIOPSY Right 01/04/2023   US  RT BREAST BX W LOC DEV 1ST LESION IMG BX SPEC US  GUIDE 01/04/2023 [facility]   BREAST BIOPSY Right 01/11/2024   US  RT BREAST BX W LOC DEV EA ADD LESION IMG BX SPEC US  GUIDE 01/11/2024 [facility]   BREAST BIOPSY Right 01/11/2024   US  RT BREAST BX W LOC DEV 1ST LESION IMG BX SPEC US  GUIDE 01/11/2024 [facility]   BREAST LUMPECTOMY,RADIO FREQ LOCALIZER,AXILLARY SENTINEL LYMPH NODE BIOPSY Right 02/17/2021   Procedure: BREAST LUMPECTOMY,RADIO FREQ LOCALIZER,AXILLARY SENTINEL LYMPH NODE BIOPSY;  Surgeon: Lane Shope, MD;  Location: [facility];  Service: General;  Laterality: Right;   CARDIOVERSION N/A 09/07/2016   Procedure: CARDIOVERSION;  Surgeon: Evalene JINNY Lunger, MD;  Location: [facility];  Service: Cardiovascular;  Laterality: N/A;   CARDIOVERSION N/A 10/19/2016   Procedure: CARDIOVERSION;  Surgeon: Evalene JINNY Lunger, MD;  Location: [facility];  Service: Cardiovascular;  Laterality: N/A;   CARDIOVERSION N/A 12/14/2016   Procedure: CARDIOVERSION;  Surgeon: Lunger Evalene JINNY, MD;  Location: [facility];  Service: Cardiovascular;  Laterality: N/A;   COLONOSCOPY     COLONOSCOPY WITH PROPOFOL  N/A 05/13/2020   Procedure: COLONOSCOPY WITH PROPOFOL ;  Surgeon: Toledo, Ladell POUR, MD;  Location: [facility];  Service: Gastroenterology;  Laterality: N/A;  ELIQUIS    CYSTOSCOPY WITH STENT PLACEMENT Bilateral 07/12/2021   Procedure: CYSTOSCOPY WITH STENT PLACEMENT;  Surgeon: Francisca Redell BROCKS, MD;  Location: [facility];  Service: Urology;  Laterality: Bilateral;   CYSTOSCOPY/URETEROSCOPY/HOLMIUM LASER/STENT PLACEMENT Bilateral 07/30/2021   Procedure: CYSTOSCOPY/URETEROSCOPY/HOLMIUM LASER/BILATERAL STENT REMOVAL;  Surgeon: Francisca Redell BROCKS, MD;  Location: [facility];  Service: Urology;  Laterality: Bilateral;   ESOPHAGOGASTRODUODENOSCOPY     ESOPHAGOGASTRODUODENOSCOPY (EGD) WITH PROPOFOL  N/A 05/13/2020   Procedure: ESOPHAGOGASTRODUODENOSCOPY (EGD) WITH PROPOFOL ;  Surgeon: Toledo, Ladell POUR, MD;  Location: [facility];  Service: Gastroenterology;  Laterality: N/A;   EXCISIONAL TOTAL HIP ARTHROPLASTY WITH ANTIBIOTIC SPACERS Left 10/17/2023   Procedure: EXCISIONAL TOTAL HIP ARTHROPLASTY WITH PLACEMENT OF ANTIBIOTIC BEADS AND DRAIN;  Surgeon: Lorelle Hussar, MD;  Location: [facility];  Service: Orthopedics;  Laterality: Left;   FOOT SURGERY Left    tendon   LUMBAR FUSION     approx 2015   PORTA CATH INSERTION N/A 03/23/2021   Procedure: PORTA CATH INSERTION;  Surgeon: Jama Cordella MATSU, MD;  Location: [facility];  Service: Cardiovascular;  Laterality: N/A;   PORTA CATH REMOVAL N/A 02/27/2024   Procedure: PORTA CATH REMOVAL;  Surgeon: Jama Cordella MATSU, MD;  Location: [facility];  Service: Cardiovascular;  Laterality: N/A;   PORTACATH PLACEMENT N/A 03/23/2021   Procedure: ATTEMPTED INSERTION PORT-A-CATH;  Surgeon: Lane Shope, MD;  Location: [facility];  Service: General;  Laterality: N/A;   THYROIDECTOMY  1990   half thyroid  lobectomy secondary to a benign nodule   TOTAL HIP ARTHROPLASTY Right 08/21/2018   Procedure: TOTAL HIP ARTHROPLASTY ANTERIOR APPROACH-RIGHT CELL SAVER REQUESTED;  Surgeon: Kathlynn Sharper, MD;  Location: [facility];  Service: Orthopedics;  Laterality: Right;   TOTAL HIP ARTHROPLASTY Left 05/11/2023   Procedure: TOTAL HIP ARTHROPLASTY ANTERIOR APPROACH;  Surgeon: Lorelle Hussar, MD;  Location: [facility];  Service: Orthopedics;  Laterality: Left;   TOTAL KNEE  ARTHROPLASTY Left 09/11/2023   Procedure: ARTHROPLASTY, KNEE, TOTAL;  Surgeon: Lorelle Hussar, MD;  Location: [facility];  Service: Orthopedics;  Laterality: Left;    SOCIAL HISTORY: Social History   Socioeconomic History   Marital status: Married    Spouse name: jeffrey   Number of children: 1   Years of education: Not on file   Highest education level: 12th grade  Occupational History    Comment: disabled  Tobacco Use   Smoking status: Former    Current packs/day: 0.00    Average packs/day: 0.3 packs/day for 10.0 years (2.5 ttl pk-yrs)    Types: Cigarettes    Start date: 61    Quit date: 2005    Years since quitting: 21.1    Passive exposure: Past   Smokeless tobacco: Never   Tobacco comments:    smoked for 10 years, 1 pack every 2-3 days  Vaping Use   Vaping status: Never Used  Substance and Sexual Activity   Alcohol use: Yes    Alcohol/week: 1.0 standard drink of alcohol    Types: 1 Glasses of wine per week    Comment: margarita qd   Drug use: Not Currently   Sexual activity: Not Currently  Other Topics Concern   Not on file  Social History Narrative   Moved to [place] from [place], originally from [place].    1 cup of caffeine  daily   Right handed   Lives with husband, step-daughter and child       Social Drivers of Health   Tobacco Use: Medium Risk (08/16/2024)   Patient History    Smoking Tobacco Use: Former    Smokeless Tobacco Use: Never    Passive Exposure: Past  Physicist, Medical Strain: Low Risk (05/01/2024)   Overall Financial Resource Strain (CARDIA)    Difficulty of Paying Living Expenses: Not very hard  Food Insecurity: No Food Insecurity (05/01/2024)   Epic    Worried About Programme Researcher, Broadcasting/film/video in the Last Year: Never true    Ran Out of Food in the Last Year: Never true  Transportation Needs: No Transportation Needs (05/01/2024)   Epic    Lack of Transportation (Medical): No    Lack of Transportation (Non-Medical):  No  Physical Activity: Unknown (05/01/2024)   Exercise Vital Sign    Days of Exercise per Week: Patient declined  Minutes of Exercise per Session: Not on file  Stress: Stress Concern Present (05/01/2024)   [employer] of Occupational Health - Occupational Stress Questionnaire    Feeling of Stress: Very much  Social Connections: Socially Integrated (05/01/2024)   Social Connection and Isolation Panel    Frequency of Communication with Friends and Family: More than three times a week    Frequency of Social Gatherings with Friends and Family: More than three times a week    Attends Religious Services: More than 4 times per year    Active Member of [employer] or Organizations: Yes    Attends Banker Meetings: More than 4 times per year    Marital Status: Married  Catering Manager Violence: Not At Risk (12/25/2023)   Epic    Fear of Current or Ex-Partner: No    Emotionally Abused: No    Physically Abused: No    Sexually Abused: No  Depression (PHQ2-9): Low Risk (05/15/2024)   Depression (PHQ2-9)    PHQ-2 Score: 0  Alcohol Screen: Low Risk (05/01/2024)   Alcohol Screen    Last Alcohol Screening Score (AUDIT): 2  Housing: Low Risk (05/01/2024)   Epic    Unable to Pay for Housing in the Last Year: No    Number of Times Moved in the Last Year: 0    Homeless in the Last Year: No  Utilities: Not At Risk (03/04/2024)   Received from [facility]   Epic    In the past 12 months has the electric, gas, oil, or water  company threatened to shut off services in your home?: No  Health Literacy: Adequate Health Literacy (12/25/2023)   B1300 Health Literacy    Frequency of need for help with medical instructions: Never    FAMILY HISTORY: Family History  Problem Relation Age of Onset   Cancer Mother        leukemia   Cancer Father 82       colon   Heart disease Father    Heart attack Father 20       died from MI   Hyperlipidemia Father    Hypertension  Father    Cancer Sister 56       ovarian   Depression Sister    Cancer Brother        CLL   Leukemia Brother    Breast cancer Paternal Aunt        40's   Migraines Neg Hx    Thyroid  cancer Neg Hx     ALLERGIES:  is allergic to hydrocodone , amiodarone , hydrocodone -acetaminophen , tapentadol, adhesive [tape], latex, oxycodone , and wound dressing adhesive.  MEDICATIONS:  Current Outpatient Medications  Medication Sig Dispense Refill   acetaminophen  (TYLENOL ) 500 MG tablet Take 2 tablets (1,000 mg total) by mouth every 8 (eight) hours. 30 tablet 0   ALPRAZolam  (XANAX ) 0.25 MG tablet TAKE 1 TABLET BY MOUTH TWICE A DAY AS NEEDED FOR ANXIETY 60 tablet 2   apixaban  (ELIQUIS ) 5 MG TABS tablet Take 1 tablet (5 mg total) by mouth 2 (two) times daily. 180 tablet 3   bisoprolol  (ZEBETA ) 10 MG tablet TAKE 1.5 TABLETS BY MOUTH 2 TIMES DAILY. 270 tablet 3   busPIRone  (BUSPAR ) 5 MG tablet Take 1 tablet (5 mg total) by mouth 3 (three) times daily as needed. 270 tablet 3   Calcium  Carbonate (CALCIUM  500 PO) Take 1 tablet by mouth daily.     Cholecalciferol  (VITAMIN D3) 5000 units CAPS Take 5,000 Units by mouth  daily.     COMIRNATY syringe Inject 0.3 mLs into the muscle every 3 (three) months.     docusate sodium  (COLACE) 100 MG capsule Take 1 capsule (100 mg total) by mouth 2 (two) times daily.     escitalopram  (LEXAPRO ) 20 MG tablet Take 1 tablet (20 mg total) by mouth daily. 90 tablet 3   furosemide  (LASIX ) 20 MG tablet TAKE 1 TABLET (20 MG TOTAL) BY MOUTH DAILY. MAY TAKE ADDITIONAL 20MG  AS NEEDED FOR LEG SWELLING 180 tablet 3   KLOR-CON  M20 20 MEQ tablet TAKE 1 TABLET BY MOUTH EVERY DAY 90 tablet 3   losartan -hydrochlorothiazide  (HYZAAR) 50-12.5 MG tablet Take 0.5 tablets by mouth daily as needed (for BP 140/80). 30 tablet 1   metoCLOPramide  (REGLAN ) 10 MG tablet Take 1 tablet (10 mg total) by mouth every 6 (six) hours as needed. 30 tablet 0   Multiple Vitamin (MULTIVITAMIN) tablet Take 1 tablet by  mouth daily.     ondansetron  (ZOFRAN ) 4 MG tablet Take 1 tablet (4 mg total) by mouth every 6 (six) hours as needed for nausea. 20 tablet 0   ranolazine  (RANEXA ) 500 MG 12 hr tablet TAKE 1 TABLET BY MOUTH TWICE A DAY 180 tablet 3   rOPINIRole  (REQUIP ) 0.5 MG tablet TAKE 1 TABLET BY MOUTH AT BEDTIME. 90 tablet 3   rOPINIRole  (REQUIP ) 1 MG tablet Take 1 mg by mouth at bedtime.     rOPINIRole  (REQUIP ) 2 MG tablet TAKE 1 TABLET BY MOUTH EVERYDAY AT BEDTIME 90 tablet 3   rOPINIRole  (REQUIP ) 3 MG tablet Take 3 mg by mouth at bedtime.     rosuvastatin  (CRESTOR ) 40 MG tablet TAKE 1 TABLET BY MOUTH EVERY DAY 90 tablet 3   traMADol  (ULTRAM ) 50 MG tablet 1/2-1 po qhs prn     famotidine  (PEPCID ) 20 MG tablet Take 1 tablet (20 mg total) by mouth 2 (two) times daily. (Patient not taking: Reported on 08/16/2024) 60 tablet 0   No current facility-administered medications for this visit.     PHYSICAL EXAMINATION: ECOG PERFORMANCE STATUS: 1 - Symptomatic but completely ambulatory Vitals:   08/16/24 1113 08/16/24 1120  BP: (!) 152/88 (!) 145/89  Pulse: (!) 56 (!) 56  Resp: 18   Temp: (!) 97.3 F (36.3 C)   SpO2: 96%     Filed Weights   08/16/24 1113  Weight: 266 lb 9.6 oz (120.9 kg)      Physical Exam Constitutional:      General: She is not in acute distress.    Appearance: She is obese.  HENT:     Head: Normocephalic and atraumatic.  Eyes:     General: No scleral icterus. Cardiovascular:     Rate and Rhythm: Normal rate and regular rhythm.     Heart sounds: Normal heart sounds.  Pulmonary:     Effort: Pulmonary effort is normal. No respiratory distress.     Breath sounds: No wheezing.  Abdominal:     General: Bowel sounds are normal. There is no distension.     Palpations: Abdomen is soft.  Musculoskeletal:        General: No deformity. Normal range of motion.     Cervical back: Normal range of motion and neck supple.     Comments: 1+ edema bilaterally in lower extremity     Skin:    General: Skin is warm and dry.     Findings: No erythema or rash.  Neurological:     Mental Status: She is alert  and oriented to person, place, and time. Mental status is at baseline.  Psychiatric:        Mood and Affect: Mood normal.     . LABORATORY DATA:  I have reviewed the data as listed    Latest Ref Rng & Units 08/16/2024   11:02 AM 07/03/2024   10:00 AM 02/15/2024   10:39 AM  CBC  WBC 4.0 - 10.5 K/uL 3.6  4.0  3.8   Hemoglobin 12.0 - 15.0 g/dL 86.4  86.4  87.6   Hematocrit 36.0 - 46.0 % 39.8  40.3  36.8   Platelets 150 - 400 K/uL 155  125  160       Latest Ref Rng & Units 08/16/2024   11:01 AM 07/01/2024   11:42 AM 03/19/2024   10:50 AM  CMP  Glucose 70 - 99 mg/dL 94  86    BUN 8 - 23 mg/dL 15  21    Creatinine 9.55 - 1.00 mg/dL 9.06  8.89    Sodium 864 - 145 mmol/L 140  142    Potassium 3.5 - 5.1 mmol/L 4.3  4.8    Chloride 98 - 111 mmol/L 104  103    CO2 22 - 32 mmol/L 23  25    Calcium  8.9 - 10.3 mg/dL 9.2  9.3  8.9   Total Protein 6.5 - 8.1 g/dL 6.7  6.5    Total Bilirubin 0.0 - 1.2 mg/dL 0.6  0.5    Alkaline Phos 38 - 126 U/L 75  81    AST 15 - 41 U/L 19  20    ALT 0 - 44 U/L 11  10        RADIOGRAPHIC STUDIES: I have personally reviewed the radiological images as listed and agreed with the findings in the report. NM PET CT CARDIAC PERFUSION MULTI W/ABSOLUTE BLOODFLOW Result Date: 08/01/2024   The study is normal. The study is low risk.   LV perfusion is normal. There is no evidence of ischemia. There is no evidence of infarction.   Rest left ventricular function is normal. Rest EF: 59%. Stress left ventricular function is normal. Stress EF: 63%. End diastolic cavity size is normal. End systolic cavity size is normal.   Myocardial blood flow was computed to be 0.73ml/g/min at rest and 1.39ml/g/min at stress. Global myocardial blood flow reserve was 2.26 and was normal.   Coronary calcium  was present on the attenuation correction CT images. Mild  coronary calcifications were present. Coronary calcifications were present in the left anterior descending artery and right coronary artery distribution(s).   Electronically signed by Lonni Nanas, MD EXAM: OVER-READ INTERPRETATION  CT CHEST The following report is a limited chest CT over-read performed by radiologist Dr. Elsie Ko [facility] on 08/01/2024. This over-read does not include interpretation of cardiac or coronary anatomy or pathology nor does it include evaluation of the PET data. The cardiac PET-CT interpretation by the cardiologist is attached. COMPARISON:  Chest CTA 08/25/2023. FINDINGS: Mediastinum/Nodes: No enlarged lymph nodes within the visualized mediastinum. Lungs/Pleura: There is no pleural effusion. The visualized lungs appear clear. Upper abdomen: No significant findings in the visualized upper abdomen. Musculoskeletal/Chest wall: No chest wall mass or suspicious osseous findings within the visualized chest on axial imaging. Incompletely visualized postsurgical changes laterally in the right breast, evaluated by recent breast MRI. Mild spondylosis. IMPRESSION: 1. No significant extracardiac findings within the visualized chest. 2. Incompletely visualized postsurgical changes laterally in the right breast, evaluated by recent  breast MRI. Electronically Signed   By: Elsie Perone M.D.   On: 08/01/2024 13:22    "

## 2024-08-16 NOTE — Assessment & Plan Note (Addendum)
#  Right breast invasive carcinoma pT1b pN0 ER negative, PR weakly positive HER 2 positive.  Not on endocrine therapy due to unclear magnitude of benefit Patient does not tolerate adjuvant chemotherapy with Taxol  and trastuzumab  despite Taxol  dose reduction.-->Status post adjuvant radiation--> 07/27/2022 Finished maintenance Trastuzumab  Labs are reviewed and discussed with patient. She is doing well clinically.  Mammogram and MRI breasts findings are reviewed, multiple enhancing masses/lesions in the right breast, similar to previous biopsied fat necrosis lesion. Recommend repeat MRI breast in 6 months, annual screening mammogram

## 2024-08-16 NOTE — Assessment & Plan Note (Addendum)
 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

## 2024-08-16 NOTE — Progress Notes (Signed)
 Pt here for follow up. Pt reports having some leakage from right breast- has been happening for a while.

## 2024-11-11 ENCOUNTER — Ambulatory Visit: Admitting: Cardiology

## 2024-12-25 ENCOUNTER — Ambulatory Visit

## 2024-12-30 ENCOUNTER — Encounter

## 2025-02-13 ENCOUNTER — Inpatient Hospital Stay: Admitting: Oncology

## 2025-02-13 ENCOUNTER — Inpatient Hospital Stay
